# Patient Record
Sex: Male | Born: 1945 | Race: Black or African American | Hispanic: No | Marital: Married | Smoking: Former smoker
Health system: Southern US, Community
[De-identification: ages and names within clinical notes are randomized; demographics above are authoritative.]

## PROBLEM LIST (undated history)

## (undated) ENCOUNTER — Emergency Department (HOSPITAL_COMMUNITY): Admission: EM | Payer: Medicare Other | Source: Home / Self Care

## (undated) DIAGNOSIS — I5022 Chronic systolic (congestive) heart failure: Secondary | ICD-10-CM

## (undated) DIAGNOSIS — E785 Hyperlipidemia, unspecified: Secondary | ICD-10-CM

## (undated) DIAGNOSIS — D500 Iron deficiency anemia secondary to blood loss (chronic): Secondary | ICD-10-CM

## (undated) DIAGNOSIS — Z8614 Personal history of Methicillin resistant Staphylococcus aureus infection: Secondary | ICD-10-CM

## (undated) DIAGNOSIS — I639 Cerebral infarction, unspecified: Secondary | ICD-10-CM

## (undated) DIAGNOSIS — I4891 Unspecified atrial fibrillation: Secondary | ICD-10-CM

## (undated) DIAGNOSIS — G40909 Epilepsy, unspecified, not intractable, without status epilepticus: Secondary | ICD-10-CM

## (undated) DIAGNOSIS — D682 Hereditary deficiency of other clotting factors: Secondary | ICD-10-CM

## (undated) DIAGNOSIS — I4819 Other persistent atrial fibrillation: Secondary | ICD-10-CM

## (undated) DIAGNOSIS — I255 Ischemic cardiomyopathy: Secondary | ICD-10-CM

## (undated) DIAGNOSIS — K922 Gastrointestinal hemorrhage, unspecified: Secondary | ICD-10-CM

## (undated) DIAGNOSIS — Z86010 Personal history of colonic polyps: Secondary | ICD-10-CM

## (undated) DIAGNOSIS — R569 Unspecified convulsions: Secondary | ICD-10-CM

## (undated) DIAGNOSIS — E669 Obesity, unspecified: Secondary | ICD-10-CM

## (undated) DIAGNOSIS — R58 Hemorrhage, not elsewhere classified: Secondary | ICD-10-CM

## (undated) DIAGNOSIS — Z95810 Presence of automatic (implantable) cardiac defibrillator: Secondary | ICD-10-CM

## (undated) DIAGNOSIS — I10 Essential (primary) hypertension: Secondary | ICD-10-CM

## (undated) DIAGNOSIS — I2510 Atherosclerotic heart disease of native coronary artery without angina pectoris: Secondary | ICD-10-CM

## (undated) HISTORY — DX: Iron deficiency anemia secondary to blood loss (chronic): D50.0

## (undated) HISTORY — DX: Personal history of colonic polyps: Z86.010

## (undated) HISTORY — PX: OTHER SURGICAL HISTORY: SHX169

---

## 2009-12-24 HISTORY — PX: CORONARY ARTERY BYPASS GRAFT: SHX141

## 2011-05-25 DIAGNOSIS — Z8614 Personal history of Methicillin resistant Staphylococcus aureus infection: Secondary | ICD-10-CM

## 2011-05-25 DIAGNOSIS — D682 Hereditary deficiency of other clotting factors: Secondary | ICD-10-CM

## 2011-05-25 HISTORY — DX: Hereditary deficiency of other clotting factors: D68.2

## 2011-05-25 HISTORY — DX: Personal history of Methicillin resistant Staphylococcus aureus infection: Z86.14

## 2012-09-21 ENCOUNTER — Inpatient Hospital Stay (HOSPITAL_COMMUNITY)
Admission: EM | Admit: 2012-09-21 | Discharge: 2012-10-10 | DRG: 291 | Disposition: A | Discharge status: Home Health | Payer: PRIVATE HEALTH INSURANCE | Attending: Internal Medicine | Admitting: Internal Medicine

## 2012-09-21 ENCOUNTER — Inpatient Hospital Stay (HOSPITAL_COMMUNITY): Payer: PRIVATE HEALTH INSURANCE

## 2012-09-21 ENCOUNTER — Encounter (HOSPITAL_COMMUNITY): Payer: Self-pay | Admitting: *Deleted

## 2012-09-21 ENCOUNTER — Emergency Department (HOSPITAL_COMMUNITY): Payer: PRIVATE HEALTH INSURANCE

## 2012-09-21 DIAGNOSIS — E669 Obesity, unspecified: Secondary | ICD-10-CM | POA: Diagnosis present

## 2012-09-21 DIAGNOSIS — M542 Cervicalgia: Secondary | ICD-10-CM

## 2012-09-21 DIAGNOSIS — I255 Ischemic cardiomyopathy: Secondary | ICD-10-CM

## 2012-09-21 DIAGNOSIS — R9389 Abnormal findings on diagnostic imaging of other specified body structures: Secondary | ICD-10-CM

## 2012-09-21 DIAGNOSIS — G40909 Epilepsy, unspecified, not intractable, without status epilepticus: Secondary | ICD-10-CM | POA: Diagnosis not present

## 2012-09-21 DIAGNOSIS — I498 Other specified cardiac arrhythmias: Secondary | ICD-10-CM

## 2012-09-21 DIAGNOSIS — N189 Chronic kidney disease, unspecified: Secondary | ICD-10-CM | POA: Diagnosis present

## 2012-09-21 DIAGNOSIS — D682 Hereditary deficiency of other clotting factors: Secondary | ICD-10-CM

## 2012-09-21 DIAGNOSIS — I509 Heart failure, unspecified: Secondary | ICD-10-CM

## 2012-09-21 DIAGNOSIS — I2589 Other forms of chronic ischemic heart disease: Secondary | ICD-10-CM | POA: Diagnosis present

## 2012-09-21 DIAGNOSIS — I959 Hypotension, unspecified: Secondary | ICD-10-CM | POA: Diagnosis not present

## 2012-09-21 DIAGNOSIS — R911 Solitary pulmonary nodule: Secondary | ICD-10-CM

## 2012-09-21 DIAGNOSIS — I5023 Acute on chronic systolic (congestive) heart failure: Principal | ICD-10-CM

## 2012-09-21 DIAGNOSIS — I4892 Unspecified atrial flutter: Secondary | ICD-10-CM | POA: Diagnosis present

## 2012-09-21 DIAGNOSIS — IMO0001 Reserved for inherently not codable concepts without codable children: Secondary | ICD-10-CM | POA: Diagnosis not present

## 2012-09-21 DIAGNOSIS — T45515A Adverse effect of anticoagulants, initial encounter: Secondary | ICD-10-CM | POA: Diagnosis not present

## 2012-09-21 DIAGNOSIS — D66 Hereditary factor VIII deficiency: Secondary | ICD-10-CM | POA: Diagnosis present

## 2012-09-21 DIAGNOSIS — Z91199 Patient's noncompliance with other medical treatment and regimen due to unspecified reason: Secondary | ICD-10-CM

## 2012-09-21 DIAGNOSIS — Z95810 Presence of automatic (implantable) cardiac defibrillator: Secondary | ICD-10-CM

## 2012-09-21 DIAGNOSIS — I2510 Atherosclerotic heart disease of native coronary artery without angina pectoris: Secondary | ICD-10-CM

## 2012-09-21 DIAGNOSIS — R569 Unspecified convulsions: Secondary | ICD-10-CM

## 2012-09-21 DIAGNOSIS — Z951 Presence of aortocoronary bypass graft: Secondary | ICD-10-CM

## 2012-09-21 DIAGNOSIS — I4891 Unspecified atrial fibrillation: Secondary | ICD-10-CM

## 2012-09-21 DIAGNOSIS — R58 Hemorrhage, not elsewhere classified: Secondary | ICD-10-CM | POA: Diagnosis not present

## 2012-09-21 DIAGNOSIS — R791 Abnormal coagulation profile: Secondary | ICD-10-CM

## 2012-09-21 DIAGNOSIS — R000 Tachycardia, unspecified: Secondary | ICD-10-CM

## 2012-09-21 DIAGNOSIS — Y9210 Unspecified residential institution as the place of occurrence of the external cause: Secondary | ICD-10-CM | POA: Diagnosis not present

## 2012-09-21 DIAGNOSIS — Z79899 Other long term (current) drug therapy: Secondary | ICD-10-CM

## 2012-09-21 DIAGNOSIS — N289 Disorder of kidney and ureter, unspecified: Secondary | ICD-10-CM

## 2012-09-21 DIAGNOSIS — I10 Essential (primary) hypertension: Secondary | ICD-10-CM | POA: Diagnosis present

## 2012-09-21 DIAGNOSIS — E785 Hyperlipidemia, unspecified: Secondary | ICD-10-CM

## 2012-09-21 DIAGNOSIS — J039 Acute tonsillitis, unspecified: Secondary | ICD-10-CM | POA: Diagnosis not present

## 2012-09-21 DIAGNOSIS — R1310 Dysphagia, unspecified: Secondary | ICD-10-CM

## 2012-09-21 DIAGNOSIS — Z8614 Personal history of Methicillin resistant Staphylococcus aureus infection: Secondary | ICD-10-CM

## 2012-09-21 DIAGNOSIS — R7989 Other specified abnormal findings of blood chemistry: Secondary | ICD-10-CM

## 2012-09-21 DIAGNOSIS — Z8673 Personal history of transient ischemic attack (TIA), and cerebral infarction without residual deficits: Secondary | ICD-10-CM

## 2012-09-21 DIAGNOSIS — R0600 Dyspnea, unspecified: Secondary | ICD-10-CM

## 2012-09-21 DIAGNOSIS — D689 Coagulation defect, unspecified: Secondary | ICD-10-CM | POA: Diagnosis present

## 2012-09-21 DIAGNOSIS — R040 Epistaxis: Secondary | ICD-10-CM

## 2012-09-21 DIAGNOSIS — J36 Peritonsillar abscess: Secondary | ICD-10-CM | POA: Diagnosis not present

## 2012-09-21 DIAGNOSIS — I129 Hypertensive chronic kidney disease with stage 1 through stage 4 chronic kidney disease, or unspecified chronic kidney disease: Secondary | ICD-10-CM | POA: Diagnosis present

## 2012-09-21 DIAGNOSIS — Z9119 Patient's noncompliance with other medical treatment and regimen: Secondary | ICD-10-CM

## 2012-09-21 DIAGNOSIS — Z87891 Personal history of nicotine dependence: Secondary | ICD-10-CM

## 2012-09-21 HISTORY — DX: Chronic systolic (congestive) heart failure: I50.22

## 2012-09-21 HISTORY — DX: Essential (primary) hypertension: I10

## 2012-09-21 HISTORY — DX: Cerebral infarction, unspecified: I63.9

## 2012-09-21 HISTORY — DX: Personal history of Methicillin resistant Staphylococcus aureus infection: Z86.14

## 2012-09-21 HISTORY — DX: Epilepsy, unspecified, not intractable, without status epilepticus: G40.909

## 2012-09-21 HISTORY — DX: Atherosclerotic heart disease of native coronary artery without angina pectoris: I25.10

## 2012-09-21 HISTORY — DX: Presence of automatic (implantable) cardiac defibrillator: Z95.810

## 2012-09-21 HISTORY — DX: Ischemic cardiomyopathy: I25.5

## 2012-09-21 HISTORY — DX: Unspecified atrial fibrillation: I48.91

## 2012-09-21 HISTORY — DX: Obesity, unspecified: E66.9

## 2012-09-21 HISTORY — DX: Hereditary deficiency of other clotting factors: D68.2

## 2012-09-21 HISTORY — DX: Hyperlipidemia, unspecified: E78.5

## 2012-09-21 LAB — BASIC METABOLIC PANEL
BUN: 22 mg/dL (ref 6–23)
BUN: 22 mg/dL (ref 6–23)
Calcium: 9.2 mg/dL (ref 8.4–10.5)
Chloride: 106 mEq/L (ref 96–112)
Creatinine, Ser: 1.24 mg/dL (ref 0.50–1.35)
Creatinine, Ser: 1.33 mg/dL (ref 0.50–1.35)
GFR calc Af Amer: 64 mL/min — ABNORMAL LOW (ref >90)
GFR calc non Af Amer: 60 mL/min — ABNORMAL LOW (ref >90)
GFR calc non Af Amer: 55 mL/min — ABNORMAL LOW (ref >90)
Glucose, Bld: 88 mg/dL (ref 70–99)
Potassium: 4.2 mEq/L (ref 3.5–5.1)

## 2012-09-21 LAB — URINE MICROSCOPIC-ADD ON

## 2012-09-21 LAB — CBC WITH DIFFERENTIAL/PLATELET
Eosinophils Absolute: 0.2 10*3/uL (ref 0.0–0.7)
Eosinophils Relative: 5 % (ref 0–5)
HCT: 43.7 % (ref 39.0–52.0)
Hemoglobin: 14.5 g/dL (ref 13.0–17.0)
Lymphs Abs: 1.8 10*3/uL (ref 0.7–4.0)
MCH: 27.4 pg (ref 26.0–34.0)
MCHC: 33.2 g/dL (ref 30.0–36.0)
MCV: 82.6 fL (ref 78.0–100.0)
Monocytes Absolute: 0.6 10*3/uL (ref 0.1–1.0)
Monocytes Relative: 13 % — ABNORMAL HIGH (ref 3–12)
RBC: 5.29 MIL/uL (ref 4.22–5.81)

## 2012-09-21 LAB — RAPID URINE DRUG SCREEN, HOSP PERFORMED
Amphetamines: NOT DETECTED
Cocaine: NOT DETECTED
Opiates: NOT DETECTED
Tetrahydrocannabinol: NOT DETECTED

## 2012-09-21 LAB — CK TOTAL AND CKMB (NOT AT ARMC)
Relative Index: 5.2 — ABNORMAL HIGH (ref 0.0–2.5)
Total CK: 144 U/L (ref 7–232)
Total CK: 134 U/L (ref 7–232)

## 2012-09-21 LAB — URINALYSIS, ROUTINE W REFLEX MICROSCOPIC
Glucose, UA: NEGATIVE mg/dL
Protein, ur: >300 mg/dL — AB

## 2012-09-21 LAB — MAGNESIUM: Magnesium: 1.7 mg/dL (ref 1.5–2.5)

## 2012-09-21 LAB — HEMOGLOBIN A1C: Hgb A1c MFr Bld: 5.8 % — ABNORMAL HIGH (ref <5.7)

## 2012-09-21 MED ORDER — SODIUM CHLORIDE 0.9 % IJ SOLN
3.0000 mL | INTRAMUSCULAR | Status: DC | PRN
  Administered 2012-09-24 – 2012-09-28 (×3): 3 mL via INTRAVENOUS

## 2012-09-21 MED ORDER — IOHEXOL 350 MG/ML SOLN
80.0000 mL | Freq: Once | INTRAVENOUS | Status: AC | PRN
  Administered 2012-09-21: 80 mL via INTRAVENOUS

## 2012-09-21 MED ORDER — METOPROLOL TARTRATE 25 MG PO TABS
25.0000 mg | ORAL_TABLET | Freq: Two times a day (BID) | ORAL | Status: DC
Start: 2012-09-21 — End: 2012-09-21
  Administered 2012-09-21: 25 mg via ORAL
  Filled 2012-09-21 (×2): qty 1

## 2012-09-21 MED ORDER — SODIUM CHLORIDE 0.9 % IV SOLN
250.0000 mL | INTRAVENOUS | Status: DC | PRN
  Administered 2012-09-24: 10 mL/h via INTRAVENOUS
  Administered 2012-10-06 – 2012-10-10 (×6): 250 mL via INTRAVENOUS

## 2012-09-21 MED ORDER — ASPIRIN EC 81 MG PO TBEC
81.0000 mg | DELAYED_RELEASE_TABLET | Freq: Every day | ORAL | Status: DC
  Administered 2012-09-21 – 2012-09-29 (×9): 81 mg via ORAL
  Filled 2012-09-21 (×10): qty 1

## 2012-09-21 MED ORDER — ACETAMINOPHEN 325 MG PO TABS
650.0000 mg | ORAL_TABLET | ORAL | Status: DC | PRN
  Administered 2012-09-25 – 2012-10-06 (×4): 650 mg via ORAL
  Filled 2012-09-21: qty 2
  Filled 2012-09-21: qty 1
  Filled 2012-09-21 (×3): qty 2

## 2012-09-21 MED ORDER — FUROSEMIDE 10 MG/ML IJ SOLN
40.0000 mg | Freq: Once | INTRAMUSCULAR | Status: AC
  Administered 2012-09-21: 40 mg via INTRAVENOUS
  Filled 2012-09-21: qty 4

## 2012-09-21 MED ORDER — METOPROLOL TARTRATE 1 MG/ML IV SOLN
2.5000 mg | Freq: Once | INTRAVENOUS | Status: AC
  Administered 2012-09-21: 2.5 mg via INTRAVENOUS
  Filled 2012-09-21: qty 5

## 2012-09-21 MED ORDER — SODIUM CHLORIDE 0.9 % IV BOLUS (SEPSIS)
500.0000 mL | Freq: Once | INTRAVENOUS | Status: AC
  Administered 2012-09-21: 500 mL via INTRAVENOUS

## 2012-09-21 MED ORDER — LORAZEPAM 2 MG/ML IJ SOLN
1.0000 mg | Freq: Once | INTRAMUSCULAR | Status: AC
  Administered 2012-09-21: 1 mg via INTRAVENOUS
  Filled 2012-09-21: qty 1

## 2012-09-21 MED ORDER — LISINOPRIL 5 MG PO TABS
5.0000 mg | ORAL_TABLET | Freq: Every day | ORAL | Status: DC
  Administered 2012-09-21 – 2012-09-22 (×2): 5 mg via ORAL
  Filled 2012-09-21 (×2): qty 1

## 2012-09-21 MED ORDER — METOPROLOL TARTRATE 1 MG/ML IV SOLN
INTRAVENOUS | Status: AC
  Filled 2012-09-21: qty 5

## 2012-09-21 MED ORDER — METOPROLOL TARTRATE 1 MG/ML IV SOLN
5.0000 mg | Freq: Once | INTRAVENOUS | Status: AC
  Administered 2012-09-21: 5 mg via INTRAVENOUS

## 2012-09-21 MED ORDER — FUROSEMIDE 10 MG/ML IJ SOLN
40.0000 mg | Freq: Two times a day (BID) | INTRAMUSCULAR | Status: DC
  Administered 2012-09-21 – 2012-09-22 (×2): 40 mg via INTRAVENOUS
  Filled 2012-09-21 (×4): qty 4

## 2012-09-21 MED ORDER — LORAZEPAM 1 MG PO TABS
1.0000 mg | ORAL_TABLET | Freq: Once | ORAL | Status: AC
  Administered 2012-09-21: 1 mg via ORAL
  Filled 2012-09-21: qty 1

## 2012-09-21 MED ORDER — SODIUM CHLORIDE 0.9 % IJ SOLN
3.0000 mL | Freq: Two times a day (BID) | INTRAMUSCULAR | Status: DC
  Administered 2012-09-21 – 2012-10-07 (×16): 3 mL via INTRAVENOUS

## 2012-09-21 MED ORDER — METOPROLOL TARTRATE 1 MG/ML IV SOLN
5.0000 mg | Freq: Once | INTRAVENOUS | Status: AC
  Administered 2012-09-21: 5 mg via INTRAVENOUS
  Filled 2012-09-21: qty 5

## 2012-09-21 MED ORDER — HEPARIN (PORCINE) IN NACL 100-0.45 UNIT/ML-% IJ SOLN
1350.0000 [IU]/h | INTRAMUSCULAR | Status: DC
  Administered 2012-09-21 – 2012-09-24 (×3): 1350 [IU]/h via INTRAVENOUS
  Filled 2012-09-21 (×5): qty 250

## 2012-09-21 MED ORDER — ENOXAPARIN SODIUM 100 MG/ML ~~LOC~~ SOLN
100.0000 mg | Freq: Two times a day (BID) | SUBCUTANEOUS | Status: DC
  Filled 2012-09-21 (×2): qty 1

## 2012-09-21 MED ORDER — ONDANSETRON HCL 4 MG/2ML IJ SOLN
4.0000 mg | Freq: Four times a day (QID) | INTRAMUSCULAR | Status: DC | PRN
  Administered 2012-09-25: 4 mg via INTRAVENOUS
  Filled 2012-09-21: qty 2

## 2012-09-21 MED ORDER — SIMVASTATIN 40 MG PO TABS
40.0000 mg | ORAL_TABLET | Freq: Every day | ORAL | Status: DC
  Administered 2012-09-21 – 2012-10-05 (×14): 40 mg via ORAL
  Filled 2012-09-21 (×16): qty 1

## 2012-09-21 MED ORDER — METOPROLOL TARTRATE 25 MG PO TABS
25.0000 mg | ORAL_TABLET | Freq: Four times a day (QID) | ORAL | Status: DC
  Administered 2012-09-21 – 2012-09-22 (×3): 25 mg via ORAL
  Filled 2012-09-21 (×6): qty 1

## 2012-09-21 MED ORDER — ENOXAPARIN SODIUM 40 MG/0.4ML ~~LOC~~ SOLN
40.0000 mg | SUBCUTANEOUS | Status: DC
  Administered 2012-09-21: 40 mg via SUBCUTANEOUS
  Filled 2012-09-21: qty 0.4

## 2012-09-21 NOTE — Progress Notes (Signed)
Dr. Tenny Craw paged regarding EKG changes seen on 12 lead EKG and to report pt has a  CK-MB at 7.5 critical level. Awaiting for response.  Ancil Linsey RN

## 2012-09-21 NOTE — ED Notes (Signed)
Dr. Rhunette Croft notified of pt's anxiety regarding laying flat in CT.

## 2012-09-21 NOTE — ED Notes (Signed)
Left message with Secretary, RN Cordelia Pen to call us back ASAP.

## 2012-09-21 NOTE — Progress Notes (Signed)
Dr. Tenny Craw updated with pt hr in the 130's still post lopressor 25 mg po. SBP in the 120's . New order received for lopressor IV nad increased  Po lopressor. Ancil Linsey  RN

## 2012-09-21 NOTE — Progress Notes (Signed)
[facility] faxed request for Medical Red[facility] to [facility]. Awaiting for response. Thanks Ancil Linsey RN

## 2012-09-21 NOTE — Consult Note (Signed)
Patient ID: Louis Ford MRN: [id number], DOB/AGE: [birth year]   Admit date: 09/21/2012 Date of Consult: @TODAY @  Primary Physician: Quitman Livings, MD Primary Cardiologist: New    Problem List: History reviewed. No pertinent past medical history.  History reviewed. No pertinent past surgical history.   Allergies: No Known Allergies  HPI: Patient is a 66 year old with history of cardiomyopathy.  Asked to see re continued care.  Patient poor historian at present as received ativan for CT. Patient moved to [place] from [place].   He has a history of CAD (s/p CABG at [facility] in [facility]  Family member reprorts this was greater than 10 years ago.)  He is also s/p AICD placement [facility]) Had change out done at   [employer]  Patient does not remember years.  Says device never fired.   Patient woke up with SOB today.     Denies CP.    Says this was sudden.   Denies palpitations.  No syncope.  No significant edema.  Relative reports that patient has not been seen by Cardiology in a long time.  Inpatient Medications:    . furosemide  40 mg Intravenous Once  . LORazepam  1 mg Intravenous Once  . LORazepam  1 mg Oral Once  . metoprolol  5 mg Intravenous Once  . sodium chloride  500 mL Intravenous Once    History reviewed. No pertinent family history.   History   Social History  . Marital Status: Single    Spouse Name: N/A    Number of Children: N/A  . Years of Education: N/A   Occupational History  . Not on file.   Social History Main Topics  . Smoking status: Not on file  . Smokeless tobacco: Not on file  . Alcohol Use:   . Drug Use: No  . Sexually Active:    Other Topics Concern  . Not on file   Social History Narrative  . No narrative on file     Review of Systems:  All other systems reviewed and are otherwise negative except as noted above.  Physical Exam: Filed Vitals:   09/21/12 0945  BP: 126/107  Pulse:   Temp:   Resp: 23   No  intake or output data in the 24 hours ending 09/21/12 1041  General: Well developed, well nourished, in no acute distress.  Groggy after ativan. Head: Normocephalic, atraumatic, sclera non-icteric Neck: Negative for carotid bruits. JVP is elevated.   Lungs: Clear bilaterally to auscultation without wheezes, rales, or rhonchi. Breathing is unlabored. Heart: RRR with S1 S2. No murmurs, rubs, or gallops appreciated. Abdomen: Soft, non-tender, non-distended with normoactive bowel sounds. No hepatomegaly. No rebound/guarding. No obvious abdominal masses. Msk:  Strength and tone appears normal for age. Extremities: No clubbing, cyanosis or edema.  Distal pedal pulses are 2+ and equal bilaterally. Neuro: Alert and oriented X 3. Moves all extremities spontaneously. Psych:  Responds to questions appropriately with a normal affect.  Tele:  ST  130s.  Labs: Results for orders placed during the hospital encounter of 09/21/12 (from the past 24 hour(s))  CBC WITH DIFFERENTIAL     Status: Abnormal   Collection Time   09/21/12  5:15 AM      Component Value Range   WBC 4.5  4.0 - 10.5 K/uL   RBC 5.29  4.22 - 5.81 MIL/uL   Hemoglobin 14.5  13.0 - 17.0 g/dL   HCT 40.9  81.1 - 91.4 %   MCV  82.6  78.0 - 100.0 fL   MCH 27.4  26.0 - 34.0 pg   MCHC 33.2  30.0 - 36.0 g/dL   RDW 47.8 (*) 29.5 - 62.1 %   Platelets 147 (*) 150 - 400 K/uL   Neutrophils Relative 42 (*) 43 - 77 %   Neutro Abs 1.9  1.7 - 7.7 K/uL   Lymphocytes Relative 39  12 - 46 %   Lymphs Abs 1.8  0.7 - 4.0 K/uL   Monocytes Relative 13 (*) 3 - 12 %   Monocytes Absolute 0.6  0.1 - 1.0 K/uL   Eosinophils Relative 5  0 - 5 %   Eosinophils Absolute 0.2  0.0 - 0.7 K/uL   Basophils Relative 0  0 - 1 %   Basophils Absolute 0.0  0.0 - 0.1 K/uL  BASIC METABOLIC PANEL     Status: Abnormal   Collection Time   09/21/12  5:15 AM      Component Value Range   Sodium 140  135 - 145 mEq/L   Potassium 4.2  3.5 - 5.1 mEq/L   Chloride 105  96 - 112 mEq/L    CO2 19  19 - 32 mEq/L   Glucose, Bld 88  70 - 99 mg/dL   BUN 22  6 - 23 mg/dL   Creatinine, Ser 3.08  0.50 - 1.35 mg/dL   Calcium 9.2  8.4 - 65.7 mg/dL   GFR calc non Af Amer 60 (*) >90 mL/min   GFR calc Af Amer 70 (*) >90 mL/min  TROPONIN I     Status: Normal   Collection Time   09/21/12  5:15 AM      Component Value Range   Troponin I <0.30  <0.30 ng/mL  PRO B NATRIURETIC PEPTIDE     Status: Abnormal   Collection Time   09/21/12  5:15 AM      Component Value Range   Pro B Natriuretic peptide (BNP) 2267.0 (*) 0 - 125 pg/mL  MAGNESIUM     Status: Normal   Collection Time   09/21/12  5:15 AM      Component Value Range   Magnesium 1.7  1.5 - 2.5 mg/dL  PHOSPHORUS     Status: Normal   Collection Time   09/21/12  5:15 AM      Component Value Range   Phosphorus 4.5  2.3 - 4.6 mg/dL  D-DIMER, QUANTITATIVE     Status: Abnormal   Collection Time   09/21/12  5:15 AM      Component Value Range   D-Dimer, Quant 0.84 (*) 0.00 - 0.48 ug/mL-FEU  URINALYSIS, ROUTINE W REFLEX MICROSCOPIC     Status: Abnormal   Collection Time   09/21/12  8:36 AM      Component Value Range   Color, Urine AMBER (*) YELLOW   APPearance CLOUDY (*) CLEAR   Specific Gravity, Urine 1.021  1.005 - 1.030   pH 5.5  5.0 - 8.0   Glucose, UA NEGATIVE  NEGATIVE mg/dL   Hgb urine dipstick SMALL (*) NEGATIVE   Bilirubin Urine SMALL (*) NEGATIVE   Ketones, ur NEGATIVE  NEGATIVE mg/dL   Protein, ur >846 (*) NEGATIVE mg/dL   Urobilinogen, UA 1.0  0.0 - 1.0 mg/dL   Nitrite NEGATIVE  NEGATIVE   Leukocytes, UA NEGATIVE  NEGATIVE  URINE RAPID DRUG SCREEN (HOSP PERFORMED)     Status: Normal   Collection Time   09/21/12  8:36 AM  Component Value Range   Opiates NONE DETECTED  NONE DETECTED   Cocaine NONE DETECTED  NONE DETECTED   Benzodiazepines NONE DETECTED  NONE DETECTED   Amphetamines NONE DETECTED  NONE DETECTED   Tetrahydrocannabinol NONE DETECTED  NONE DETECTED   Barbiturates NONE DETECTED  NONE DETECTED    URINE MICROSCOPIC-ADD ON     Status: Abnormal   Collection Time   09/21/12  8:36 AM      Component Value Range   Squamous Epithelial / LPF MANY (*) RARE   RBC / HPF 3-6  <3 RBC/hpf   Casts GRANULAR CAST (*) NEGATIVE    Radiology/Studies: Dg Chest Port 1 View  09/21/2012  *RADIOLOGY REPORT*  Clinical Data: Shortness of breath.  PORTABLE CHEST - 1 VIEW  Comparison: None.  Findings: Semi upright portable view of the chest demonstrates marked enlargement cardiac silhouette. There is a right-sided cardiac ICD.  There is a 2.9 cm ill defined density just medial to the battery device.  This could represent a large vascular structure but a nodular lesion or parenchymal density cannot be excluded.  Hazy interstitial densities in the lower lungs may represent mild edema.  Patient is status post median sternotomy.  IMPRESSION: Cardiomegaly and suspect mild interstitial edema.  Indeterminate nodular density in the right hilar region which is obscured by the ICD battery device.  The findings could represent vascular structures but cannot exclude a parenchymal lesion or nodule.  Recommend further evaluation with a chest CT.  These results were called by telephone on 09/21/2012 at 7:20 a.m. to Dr. Rhunette Croft, who verbally acknowledged these results.   Original Report Authenticated By: Richarda Overlie, M.D.     EKG:  Sinus tachycardia.  139 bpm  Twave inversion I, AVL.  QTc 493  ASSESSMENT AND PLAN:   Patient is a 66 yr old who presents with sudden SOB.  On exam, pt is hypertensive with mild volume increase.  Groggy since sedation given. SOB seems out of proportion to exam  May be related to HTN  May represent anginal equivalent.  Does not appear uncomfortable  Rec;  Admit to telemetry as doin. Lasix IV x 1  Follow response.   Patient brought list of medsd in but now unable to find Would start low dose b blocker.  Esp with tachycardia Echo to evaluate LV function  2.  CAD  Get records from [place] re CABG and ICD  [facility], [place]) Rec:  ASA.  Control BP  Check Lipids  3.  HCM  Lipids.  zocor    4.  Hx CVA   Signed, Dietrich Pates 09/21/2012, 10:41 AM

## 2012-09-21 NOTE — ED Notes (Signed)
Admitting MD at pt bedside at this time.

## 2012-09-21 NOTE — ED Notes (Signed)
Pt to CT via stretcher at this time

## 2012-09-21 NOTE — ED Provider Notes (Addendum)
History     CSN: [id number]  Arrival date & time 09/21/12  0506   First MD Initiated Contact with Patient 09/21/12 0510      Chief Complaint  Patient presents with  . Shortness of Breath    (Consider location/radiation/quality/duration/timing/severity/associated sxs/prior treatment) HPI Comments: Pt comes in with cc of sob. Pt has hx of CAD s/p CABG several years ago, no recent cardiac testing, and AICD placement for unknown etiology. Pt is new to our system, and has moved to [place] from [place]. Pt reports that this night he woke up to go to the bathroom and started having sob. The sob is described as difficulty getting air in, with no associated chest pain. Pt denies any orthopnea, or PND. He does have intermittent leg swelling, and takes "water pill" prn. Pt has no hx of DVT/PE. Pt has no new cough. His aicd hasn;t fired. No illicits, no infections.   The history is provided by the patient.    History reviewed. No pertinent past medical history.  History reviewed. No pertinent past surgical history.  History reviewed. No pertinent family history.  History  Substance Use Topics  . Smoking status: Not on file  . Smokeless tobacco: Not on file  . Alcohol Use:       Review of Systems  Constitutional: Negative for fever, chills, activity change and appetite change.  HENT: Negative for neck pain.   Eyes: Negative for visual disturbance.  Respiratory: Positive for shortness of breath. Negative for cough and chest tightness.   Cardiovascular: Positive for palpitations and leg swelling. Negative for chest pain.  Gastrointestinal: Negative for abdominal pain and abdominal distention.  Genitourinary: Negative for dysuria, enuresis and difficulty urinating.  Musculoskeletal: Negative for arthralgias.  Neurological: Positive for dizziness and light-headedness. Negative for syncope and headaches.  Psychiatric/Behavioral: Negative for confusion.    Allergies  Review of patient's  allergies indicates no known allergies.  Home Medications  No current outpatient prescriptions on file.  BP 157/112  Pulse 140  Temp 97.1 F (36.2 C) (Oral)  Resp 22  SpO2 100%  Physical Exam  Nursing note and vitals reviewed. Constitutional: He is oriented to person, place, and time. He appears well-developed.  HENT:  Head: Normocephalic and atraumatic.  Eyes: Conjunctivae normal and EOM are normal. Pupils are equal, round, and reactive to light.  Neck: Normal range of motion. Neck supple. JVD present.  Cardiovascular: Regular rhythm.   No murmur heard. Pulmonary/Chest: Effort normal and breath sounds normal. He has no wheezes. He has no rales. He exhibits no tenderness.  Abdominal: Soft. Bowel sounds are normal. He exhibits no distension. There is no tenderness. There is no rebound and no guarding.  Neurological: He is alert and oriented to person, place, and time.  Skin: Skin is warm.    ED Course  Procedures (including critical care time)   Labs Reviewed  CBC WITH DIFFERENTIAL  BASIC METABOLIC PANEL  TROPONIN I  URINALYSIS, ROUTINE W REFLEX MICROSCOPIC  URINE RAPID DRUG SCREEN (HOSP PERFORMED)  PRO B NATRIURETIC PEPTIDE  MAGNESIUM  PHOSPHORUS  D-DIMER, QUANTITATIVE   No results found.   No diagnosis found.    MDM   Date: 09/21/2012  Rate: 139  Rhythm: sinus tachycardia  QRS Axis: normal  Intervals: normal  ST/T Wave abnormalities: non specific  Conduction Disutrbances: none  Narrative Interpretation: unremarkable  Differential diagnosis includes: ACS syndrome CHF exacerbation Valvular disorder Pericardial effusion Pneumonia Pleural effusion Pulmonary edema PE Anemia Musculoskeletal pain COPD Toxin use  Pt with hx of CAD s/p CABG and AICD placement comes in with cc of acute SOB. Pt noted to be tachypneic, no respiratory distress symptoms otherwise with normal O2 sats and mildly elevated BP only. Exam shows clear lungs, tachycardia, with  JVD and mild pitting edema in the legs. He has no chest pain. AICD hasnt fired. No substance abuse, taking all his meds.  We will initiate the cardiac workup and also gt utox and dimer with cxr. Will try to get information on the device. Pt unsure of his hx, not sure why aicd was placed. Hydration to be started as well. Will reassess.      Derwood Kaplan, MD 09/21/12 0522  CXR shows some vascular congestion, dimer elevated -CT PE ordered. Still sinus tachycardia at 130s now. Will give nitro x 3 SL. BP is in the 130s as well.  Derwood Kaplan, MD 09/21/12 [phone]

## 2012-09-21 NOTE — ED Notes (Signed)
Pt has a urinal and knows we need a urine sample 

## 2012-09-21 NOTE — ED Notes (Signed)
Pt returned from CT at this time.  

## 2012-09-21 NOTE — ED Notes (Signed)
X-ray currently at bedside; family member given coffee while waiting for x-ray to finish.

## 2012-09-21 NOTE — Progress Notes (Signed)
Authorization for Use/disclosure of Protected health information  Release form faxed to [facility] in [place] sent and and received by said hospital. [facility] has called back and informed this nurse that they do not have Mr. Collins on their system. They had checked his SS # and under his name. Thanks Ancil Linsey RN

## 2012-09-21 NOTE — ED Notes (Signed)
Patient arrived to CT and attempted scan.  Patient refused due to anxiety/inability to lie still and flat- requesting to be "put out" for procedure.  Attempts to call RN x 3 failed, attempt to call MD x 2 rang over to secretary at this time.  Patient being returned to ED without exam.

## 2012-09-21 NOTE — ED Notes (Addendum)
Per EMS: pt called for SOB, pt states that he was getting SOB and feelings of empending doom. Pt was diaphoretic on EMS arrival with any exerction. Pt states that he does not normally have an irregular hr. Pt was given 324mg  of aspirin and 1 SL nitro.

## 2012-09-21 NOTE — Progress Notes (Signed)
Spoke with Tereso Newcomer PA-c regarding EKG changes and on CK MB result. New order received to transition lovenox to heparin drip. Pt ekg indicating a flutter at variable rate.  Had just spoken to Dr. Tenny Craw and was updated of above.Thanks Ancil Linsey RN

## 2012-09-21 NOTE — ED Notes (Signed)
CT aware that pt has had PO ativan and will be ready to come to CT in 15-30 minutes.

## 2012-09-21 NOTE — H&P (Signed)
History and Physical       Hospital Admission Note Date: 09/21/2012  Patient name: Louis Ford Medical record number: [id number] Date of birth: May 29, 1946 Age: 66 y.o. Gender: male PCP: Quitman Livings, MD    Chief Complaint:  sudden shortness of breath this morning  HPI: Patient is a 66 year old African American male who has recently relocated from [place] in June 2013, with a medical history of coronary disease status post CABG, AICD, hypertension, hyperlipidemia, CHF, ?seizures not on any seizure medications presented with acute shortness of breath. Per patient, he woke up to go to the bathroom around 5 AM this morning and started having shortness of breath, described as "difficulty getting the air in" but no associated chest pain.he denied fevers, chills, productive cough, any diaphoresis. Patient sleeps on 2 pillows but states for comfort. He has been having intermittent peripheral edema with dyspnea on exertion. He denies any history of DVT/PE. Patient had followed up in July, 2013 with [facility] but never filled any prescription for Vasotec, simvastatin, Lasix, Coreg. In the ED, patient was found to be tachycardiac with heart rate in 130s, sinus, with mild hypoxia, CT angiogram was done with no PE, first set of troponins negative, BNP was elevated at 2267.  Review of Systems:  Constitutional: Denies fever, chills, diaphoresis, appetite change and fatigue.  HEENT: Denies photophobia, eye pain, redness, hearing loss, ear pain, congestion, sore throat, rhinorrhea, sneezing, mouth sores, trouble swallowing, neck pain, neck stiffness and tinnitus.   Respiratory: please see history of present illness Cardiovascular: please see history of present illness Gastrointestinal: Denies nausea, vomiting, abdominal pain, diarrhea, constipation, blood in stool and abdominal distention.  Genitourinary: Denies dysuria, urgency, frequency, hematuria,  flank pain and difficulty urinating.  Musculoskeletal: Denies myalgias, back pain, joint swelling, arthralgias and gait problem.  Skin: Denies pallor, rash and wound.  Neurological: Denies dizziness, syncope, weakness, light-headedness, numbness and headaches. Patient gives a vague history of seizures after AICD placement but is not on any seizure medication for more than a year Hematological: Denies adenopathy. Easy bruising, personal or family bleeding history  Psychiatric/Behavioral: Denies suicidal ideation, mood changes, confusion, nervousness, sleep disturbance and agitation  Past Medical History: 1) Coronary artery disease status post CABG several years ago. Patient states last stress test was 2 years ago and to his knowledge was negative done at [facility] in [place] 2) A ICD placement 3) hypertension 4) hyperlipidemia 5) congestive heart failure with unknown EF 6) history of seizures after AICD placement   Past surgical history Coronary artery bypass graft AICD placement  Medications: Prior to Admission medications   Not on File  Patient is not taking any of the medications. He was prescribed Vasotec 10 mg daily, simvastatin 40 mg daily, Lasix 40 mg daily, Coreg 6.25 mg BID in July 2013, however he has not filled any prescription  Allergies:  No Known Allergies  Social History:  does not have a smoking history on file. He does not have any smokeless tobacco history on file. He reports that he does not use illicit drugs. His alcohol history not on file.  Family History: History reviewed. Patient states that his mother is deceased and had coronary disease in her 58s  Physical Exam: Blood pressure 138/110, pulse 137, temperature 97.1 F (36.2 C), temperature source Oral, resp. rate 16, SpO2 99.00%. General: Alert, awake, oriented x3, in no acute distress. HEENT: normocephalic, atraumatic, anicteric sclera, pink conjunctiva, pupils equal and reactive to light and  accomodation, oropharynx clear Neck: supple,  no masses or lymphadenopathy, no goiter, no bruits +elevated JVP Heart: tachy, Regular rate and rhythm, without murmurs, rubs or gallops. Lungs: Clear to auscultation bilaterally, no wheezing, rales or rhonchi. Abdomen: Soft, nontender, nondistended, positive bowel sounds, no masses. Extremities: No clubbing, cyanosis or edema with positive pedal pulses. Neuro: Grossly intact, no focal neurological deficits, strength 5/5 upper and lower extremities bilaterally Psych: alert and oriented x 3, normal mood and affect Skin: no rashes or lesions, warm and dry   LABS on Admission:  Basic Metabolic Panel:  Lab 09/21/12 1610  NA 140  K 4.2  CL 105  CO2 19  GLUCOSE 88  BUN 22  CREATININE 1.24  CALCIUM 9.2  MG 1.7  PHOS 4.5  CBC:  Lab 09/21/12 0515  WBC 4.5  NEUTROABS 1.9  HGB 14.5  HCT 43.7  MCV 82.6  PLT 147*   Cardiac Enzymes:  Lab 09/21/12 0515  CKTOTAL --  CKMB --  CKMBINDEX --  TROPONINI <0.30     Radiological Exams on Admission: Dg Chest Port 1 View  09/21/2012  *RADIOLOGY REPORT*  Clinical Data: Shortness of breath.  PORTABLE CHEST - 1 VIEW  Comparison: None.  Findings: Semi upright portable view of the chest demonstrates marked enlargement cardiac silhouette. There is a right-sided cardiac ICD.  There is a 2.9 cm ill defined density just medial to the battery device.  This could represent a large vascular structure but a nodular lesion or parenchymal density cannot be excluded.  Hazy interstitial densities in the lower lungs may represent mild edema.  Patient is status post median sternotomy.  IMPRESSION: Cardiomegaly and suspect mild interstitial edema.  Indeterminate nodular density in the right hilar region which is obscured by the ICD battery device.  The findings could represent vascular structures but cannot exclude a parenchymal lesion or nodule.  Recommend further evaluation with a chest CT.  These results were called  by telephone on 09/21/2012 at 7:20 a.m. to Dr. Rhunette Croft, who verbally acknowledged these results.   Original Report Authenticated By: Richarda Overlie, M.D.    EKG showed rate of 139, sinus tachycardia with repolarization changes  Assessment/Plan Present on Admission:   .Dyspnea with tachycardia: likely secondary to acute on chronic CHF exacerbation, however given tachycardia and mild hypoxia cannot rule out pulmonary embolism - admit to telemetry, serial cardiac enzymes, CT angiogram chest was done and showed no pulmonary embolism - urine drug screen was negative, obtain TSH  .CHF, acute on chronic: unknown EF - Placed on CHF protocol, serial cardiac enzymes, IV diuresis with Lasix, Vasotec,beta blocker, aspirin - obtain 2-D echocardiogram for further workup, daily weights, input and output, fluid restriction, 2 g sodium diet - Cardiology consult has been obtained, discussed with Dr. Tenny Craw  .Elevated d-dimer: CT angiogram chest done in ED negative for any acute pulmonary embolism  .Accelerated hypertension: Likely central to noncompliance - Resume beta blocker and ACE inhibitor, placed on IV diuresis  .Hyperlipidemia: obtain lipid panel, continue statins  .CAD (coronary artery disease):Currently no chest pain - Continue aspirin, beta blocker, statins, ACE inhibitor, 2-D echo pending  .AICD (automatic cardioverter/defibrillator) present  .Seizures history after ICD placement: monitor, patient is not on any seizure medications  DVT prophylaxis: Lovenox  CODE STATUS: full CODE STATUS  Further plan will depend as patient's clinical course evolves and further radiologic and laboratory data become available.   Time Spent on Admission: 1 hour  RAI,RIPUDEEP M.D. [facility] 09/21/2012, 9:59 AM Pager: [phone]  If 7PM-7AM, please contact night-coverage  [email or web address] Password [email or web address]

## 2012-09-21 NOTE — Progress Notes (Addendum)
ANTICOAGULATION CONSULT NOTE - Initial Consult  Pharmacy Consult for heparin Indication: atrial fibrillation  No Known Allergies  Patient Measurements: Height: 5\' 11"  (180.3 cm) Weight: 227 lb 9.6 oz (103.239 kg) IBW/kg (Calculated) : 75.3  Heparin Dosing Weight: 95 kg  Vital Signs: Temp: 97.8 F (36.6 C) (09/29 1842) Temp src: Oral (09/29 1842) BP: 106/80 mmHg (09/29 1842) Pulse Rate: 90  (09/29 1842)  Labs:  Basename 09/21/12 1543 09/21/12 1112 09/21/12 0515  HGB -- -- 14.5  HCT -- -- 43.7  PLT -- -- 147*  APTT -- -- --  LABPROT -- -- --  INR -- -- --  HEPARINUNFRC -- -- --  CREATININE -- 1.33 1.24  CKTOTAL 144 -- --  CKMB 7.5* -- --  TROPONINI <0.30 -- <0.30    Estimated Creatinine Clearance: 69.6 ml/min (by C-G formula based on Cr of 1.33).   Medical History: Past Medical History  Diagnosis Date  . CAD (coronary artery disease)   . CHF (congestive heart failure)     Medications:  Scheduled:    . aspirin EC  81 mg Oral Daily  . furosemide  40 mg Intravenous Once  . furosemide  40 mg Intravenous Q12H  . lisinopril  5 mg Oral Daily  . LORazepam  1 mg Intravenous Once  . LORazepam  1 mg Oral Once  . metoprolol  2.5 mg Intravenous Once  . metoprolol  5 mg Intravenous Once  . metoprolol  5 mg Intravenous Once  . metoprolol tartrate  25 mg Oral QID  . simvastatin  40 mg Oral q1800  . sodium chloride  500 mL Intravenous Once  . sodium chloride  3 mL Intravenous Q12H  . DISCONTD: enoxaparin (LOVENOX) injection  100 mg Subcutaneous Q12H  . DISCONTD: enoxaparin (LOVENOX) injection  40 mg Subcutaneous Q24H  . DISCONTD: metoprolol tartrate  25 mg Oral BID   Infusions:    Assessment: 67 yo male with atrial fib will be started on heparin therapy.  Received 40mg  of lovenox at 1410 today.  Baseline CBC ok (Plt 147k).    Goal of Therapy:  Heparin level 0.3-0.7 units/ml Monitor platelets by anticoagulation protocol: Yes   Plan:  1) D/c lovenox 40mg  sq.   Start heparin at 1350 units/hr (=13.9mL/hr). No bolus. 2) Check a 6 hour heparin level after drip is started. 3) Daily heparin level and CBC  Gardy Montanari, Tsz-Yin 09/21/2012,8:12 PM

## 2012-09-21 NOTE — Progress Notes (Signed)
Critical CK MB level at 7.5. Dr. Isidoro Donning Text paged. Ancil Linsey RN

## 2012-09-21 NOTE — Progress Notes (Addendum)
ANTICOAGULATION CONSULT NOTE - Initial Consult  Pharmacy Consult for Enoxaparin Indication: R/o pulmonary embolus  No Known Allergies  Patient Measurements:   Patient stated weight 230 lb  Vital Signs: Temp: 97.1 F (36.2 C) (09/29 0516) Temp src: Oral (09/29 0516) BP: 129/110 mmHg (09/29 1146) Pulse Rate: 130  (09/29 1100)  Labs:  Basename 09/21/12 1112 09/21/12 0515  HGB -- 14.5  HCT -- 43.7  PLT -- 147*  APTT -- --  LABPROT -- --  INR -- --  HEPARINUNFRC -- --  CREATININE 1.33 1.24  CKTOTAL -- --  CKMB -- --  TROPONINI -- <0.30    CrCl is unknown because there is no height on file for the current visit.   Medical History: Past Medical History  Diagnosis Date  . CAD (coronary artery disease)   . CHF (congestive heart failure)      Assessment: Pt comes in with CC of SoB. CXR shows some vascular congestion, D-dimer was elevated. CT-PE ordered. Enoxaparin being initiated while R/O of PE.Pt was very disoriented while speaking to him. He is new to [facility] from [place] and had not been weighed, Pt states he was around 230 lbs. He also states he has no history of renal dysfunction and has not been experiencing any bleeding or taken anticoagulants. SCr was 1.33 for CrCl of 39ml/min.    Goal of Therapy:  Monitor platelets by anticoagulation protocol: Yes   Plan:  - Start enoxaparin 100mg  SQ q12h - Monitor for s/sx of bleeding - Follow CBC every 3 days    Vania Rea. Darin Engels.D. Clinical Pharmacist Pager [phone] Phone [phone] 09/21/2012 12:34 PM

## 2012-09-22 DIAGNOSIS — I369 Nonrheumatic tricuspid valve disorder, unspecified: Secondary | ICD-10-CM

## 2012-09-22 DIAGNOSIS — R0609 Other forms of dyspnea: Secondary | ICD-10-CM

## 2012-09-22 DIAGNOSIS — I4891 Unspecified atrial fibrillation: Secondary | ICD-10-CM

## 2012-09-22 HISTORY — DX: Unspecified atrial fibrillation: I48.91

## 2012-09-22 LAB — MRSA PCR SCREENING: MRSA by PCR: NEGATIVE

## 2012-09-22 LAB — CBC
HCT: 43.4 % (ref 39.0–52.0)
MCV: 81.6 fL (ref 78.0–100.0)
RBC: 5.32 MIL/uL (ref 4.22–5.81)
WBC: 3.8 10*3/uL — ABNORMAL LOW (ref 4.0–10.5)

## 2012-09-22 LAB — BASIC METABOLIC PANEL
CO2: 21 mEq/L (ref 19–32)
Glucose, Bld: 85 mg/dL (ref 70–99)
Potassium: 4.7 mEq/L (ref 3.5–5.1)
Sodium: 136 mEq/L (ref 135–145)

## 2012-09-22 LAB — LIPID PANEL
HDL: 37 mg/dL — ABNORMAL LOW (ref >39)
LDL Cholesterol: 89 mg/dL (ref 0–99)
Total CHOL/HDL Ratio: 3.6 RATIO

## 2012-09-22 LAB — PRO B NATRIURETIC PEPTIDE: Pro B Natriuretic peptide (BNP): 3157 pg/mL — ABNORMAL HIGH (ref 0–125)

## 2012-09-22 LAB — CK TOTAL AND CKMB (NOT AT ARMC): Total CK: 132 U/L (ref 7–232)

## 2012-09-22 LAB — TROPONIN I: Troponin I: <0.3 ng/mL (ref <0.30)

## 2012-09-22 MED ORDER — PNEUMOCOCCAL VAC POLYVALENT 25 MCG/0.5ML IJ INJ
0.5000 mL | INJECTION | INTRAMUSCULAR | Status: AC
  Administered 2012-09-23: 0.5 mL via INTRAMUSCULAR
  Filled 2012-09-22: qty 0.5

## 2012-09-22 MED ORDER — DILTIAZEM HCL 100 MG IV SOLR
5.0000 mg/h | INTRAVENOUS | Status: DC
  Administered 2012-09-22 – 2012-09-24 (×2): 5 mg/h via INTRAVENOUS
  Filled 2012-09-22: qty 100

## 2012-09-22 MED ORDER — DIGOXIN 0.25 MG/ML IJ SOLN
0.5000 mg | Freq: Once | INTRAMUSCULAR | Status: AC
  Administered 2012-09-22: 0.5 mg via INTRAVENOUS
  Filled 2012-09-22 (×2): qty 2

## 2012-09-22 MED ORDER — PERFLUTREN LIPID MICROSPHERE
1.0000 mL | INTRAVENOUS | Status: AC | PRN
  Administered 2012-09-22 (×2): 2 mL via INTRAVENOUS
  Filled 2012-09-22: qty 10

## 2012-09-22 MED ORDER — SODIUM CHLORIDE 0.9 % IV BOLUS (SEPSIS)
500.0000 mL | Freq: Once | INTRAVENOUS | Status: AC
  Administered 2012-09-22: 500 mL via INTRAVENOUS

## 2012-09-22 MED ORDER — DIGOXIN 0.25 MG/ML IJ SOLN: 0.2500 mg | Freq: Once | INTRAMUSCULAR | Status: DC

## 2012-09-22 MED ORDER — INFLUENZA VIRUS VACC SPLIT PF IM SUSP
0.5000 mL | INTRAMUSCULAR | Status: AC
  Administered 2012-09-23: 0.5 mL via INTRAMUSCULAR
  Filled 2012-09-22: qty 0.5

## 2012-09-22 MED ORDER — LORAZEPAM 1 MG PO TABS
1.0000 mg | ORAL_TABLET | Freq: Once | ORAL | Status: AC
  Administered 2012-09-22: 1 mg via ORAL
  Filled 2012-09-22: qty 1

## 2012-09-22 NOTE — Progress Notes (Signed)
Pt having spells where he will fall asleep then wakes up and feels short of breath and yells out until someone goes to his room at which point he is almost hyperventilating. Sats 100% on O2 2L [place] and lungs are clear. Patient able to be talked down and breathing returns to normal within a few seconds of entering room. Pt expresses feeling of anxiousness. Benedetto Coons NP notified and orders received. Pt medicated and will cont to monitor.

## 2012-09-22 NOTE — Progress Notes (Signed)
  Echocardiogram 2D Echocardiogram has been performed.  Louis Ford 09/22/2012, 11:58 AM

## 2012-09-22 NOTE — Progress Notes (Signed)
ANTICOAGULATION CONSULT NOTE - Follow Up Consult  Pharmacy Consult for heparin Indication: atrial fibrillation  Labs:  Basename 09/22/12 0328 09/21/12 2038 09/21/12 1543 09/21/12 1112 09/21/12 0515  HGB -- -- -- -- 14.5  HCT -- -- -- -- 43.7  PLT -- -- -- -- 147*  APTT -- -- -- -- --  LABPROT -- -- -- -- --  INR -- -- -- -- --  HEPARINUNFRC 0.49 -- -- -- --  CREATININE -- -- -- 1.33 1.24  CKTOTAL -- 134 144 -- --  CKMB -- 6.6* 7.5* -- --  TROPONINI -- <0.30 <0.30 -- <0.30    Assessment/Plan:  66yo male therapeutic on heparin with initial dosing for Afib.  Will continue gtt at current rate and confirm stable with additional level.  Colleen Can PharmD BCPS 09/22/2012,4:10 AM

## 2012-09-22 NOTE — Progress Notes (Addendum)
Patient IDCaio Ford  male  AVW:[id number]    DOB: [birth year]    DOA: 09/21/2012  PCP: Quitman Livings, MD  Subjective: Patient noticed to be in A. Fib with RVR since last night, HR 110-130's, on my encounter blood pressure 93/72. Patient was started on heparin drip last night and increase the beta blocker with no significant effect on his heart rate. He denies any chest pain, dizziness, palpitations. C/o "just feeling bad"  Objective: Weight change:   Intake/Output Summary (Last 24 hours) at 09/22/12 1503 Last data filed at 09/21/12 1900  Gross per 24 hour  Intake      0 ml  Output    460 ml  Net   -460 ml   Blood pressure 93/74, pulse 121, temperature 97.5 F (36.4 C), temperature source Oral, resp. rate 20, height 5\' 11"  (1.803 m), weight 103.239 kg (227 lb 9.6 oz), SpO2 100.00%.  Physical Exam: General: Alert and awake, oriented x3, not in any acute distress. HEENT: anicteric sclera, pupils reactive to light and accommodation, EOMI CVS: irregularly irregular, tachycardia Chest: clear to auscultation bilaterally, no wheezing, rales or rhonchi Abdomen: soft nontender, nondistended, normal bowel sounds, no organomegaly Extremities: no cyanosis, clubbing. 1+ edema noted bilaterally Neuro: Cranial nerves II-XII intact, no focal neurological deficits  Lab Results: Basic Metabolic Panel:  Lab 09/22/12 8295 09/21/12 1112 09/21/12 0515  NA 136 137 --  K 4.7 5.2* --  CL 103 106 --  CO2 21 20 --  GLUCOSE 85 88 --  BUN 30* 22 --  CREATININE 1.86* 1.33 --  CALCIUM 9.3 9.3 --  MG -- -- 1.7  PHOS -- -- 4.5   CBC:  Lab 09/22/12 0328 09/21/12 0515  WBC 3.8* 4.5  NEUTROABS -- 1.9  HGB 13.7 14.5  HCT 43.4 43.7  MCV 81.6 82.6  PLT 165 147*   Cardiac Enzymes:  Lab 09/22/12 0328 09/21/12 2038 09/21/12 1543  CKTOTAL 132 134 144  CKMB 6.3* 6.6* 7.5*  CKMBINDEX -- -- --  TROPONINI <0.30 <0.30 <0.30   BNP: No components found with this basename: POCBNP:2 CBG: No results found  for this basename: GLUCAP:5 in the last 168 hours   Micro Results: No results found for this or any previous visit (from the past 240 hour(s)).  Studies/Results: Ct Angio Chest Pe W/cm &/or Wo Cm  09/21/2012  *RADIOLOGY REPORT*  Clinical Data: Shortness of breath, elevated D-dimer, evaluate for PE.  Abnormal chest radiograph.  CT ANGIOGRAPHY CHEST  Technique:  Multidetector CT imaging of the chest using the standard protocol during bolus administration of intravenous contrast. Multiplanar reconstructed images including MIPs were obtained and reviewed to evaluate the vascular anatomy.  Contrast: 80mL OMNIPAQUE IOHEXOL 350 MG/ML SOLN  Comparison: Chest radiographs dated 09/21/2012.  Findings: No evidence of pulmonary embolism.  No frank interstitial edema.  However, there are small right and trace left pleural effusions. Trace fluid along the left major fissure.  Small amount fluid along the right major fissure, possibly mildly loculated, accounting for the radiographic abnormality.  Evaluation of the lung parenchyma is constrained by respiratory motion.  No suspicious pulmonary nodules.  No pneumothorax.  Cardiomegaly.  Pericardial calcifications.  Coronary atherosclerosis. Postsurgical changes related to prior CABG.  Right subclavian ICD.  Visualized upper abdomen is poorly evaluated due to motion but notable for possible left renal cysts.  Degenerative changes of the visualized thoracolumbar spine.  IMPRESSION: No evidence of pulmonary embolism.  Cardiomegaly with small right and trace left pleural effusions.  No frank  interstitial edema.  Loculated fluid along the right major fissure, accounting for the radiographic abnormality.   Original Report Authenticated By: Charline Bills, M.D.    Dg Chest Port 1 View  09/21/2012  *RADIOLOGY REPORT*  Clinical Data: Shortness of breath.  PORTABLE CHEST - 1 VIEW  Comparison: None.  Findings: Semi upright portable view of the chest demonstrates marked enlargement  cardiac silhouette. There is a right-sided cardiac ICD.  There is a 2.9 cm ill defined density just medial to the battery device.  This could represent a large vascular structure but a nodular lesion or parenchymal density cannot be excluded.  Hazy interstitial densities in the lower lungs may represent mild edema.  Patient is status post median sternotomy.  IMPRESSION: Cardiomegaly and suspect mild interstitial edema.  Indeterminate nodular density in the right hilar region which is obscured by the ICD battery device.  The findings could represent vascular structures but cannot exclude a parenchymal lesion or nodule.  Recommend further evaluation with a chest CT.  These results were called by telephone on 09/21/2012 at 7:20 a.m. to Dr. Rhunette Croft, who verbally acknowledged these results.   Original Report Authenticated By: Richarda Overlie, M.D.     Medications: Scheduled Meds:    . aspirin EC  81 mg Oral Daily  . digoxin  0.5 mg Intravenous Once  . LORazepam  1 mg Oral Once  . metoprolol  5 mg Intravenous Once  . simvastatin  40 mg Oral q1800  . sodium chloride  500 mL Intravenous Once  . sodium chloride  3 mL Intravenous Q12H  . DISCONTD: digoxin  0.25 mg Intravenous Once  . DISCONTD: enoxaparin (LOVENOX) injection  40 mg Subcutaneous Q24H  . DISCONTD: furosemide  40 mg Intravenous Q12H  . DISCONTD: lisinopril  5 mg Oral Daily  . DISCONTD: metoprolol tartrate  25 mg Oral BID  . DISCONTD: metoprolol tartrate  25 mg Oral QID   Continuous Infusions:    . heparin 1,350 Units/hr (09/21/12 2055)     Assessment/Plan: Principal Problem:  *Atrial fibrillation with RVR with hypotension: New onset, per patient, no prior history - DC'ed metoprolol, Lasix - give 500 cc IV fluid bolus, IV digoxin 0.5mg  x1.  - On heparin drip, cardiology following, if no significant improvement, ? candidate for cardioversion  Active Problems:  Dyspnea, CHF, acute on chronic: BNP 3157 - 2-D echo done today, results,  IV diuresis on hold, lungs clear   Elevated d-dimer: CT angiogram negative for any PE   Accelerated hypertension: Currently hypotensive likely secondary to rapid Afib   Hyperlipidemia: on statin  CAD (coronary artery disease): awaiting the records from [place]   AICD (automatic cardioverter/defibrillator) present   Seizures: not on any antiseizure medication  DVT Prophylaxis:on heparin drip  Code Status:full code  Disposition:not medically ready   LOS: 1 day   RAI,RIPUDEEP M.D. [facility] 09/22/2012, 3:03 PM Pager: [phone]  If 7PM-7AM, please contact night-coverage [email or web address] Password [email or web address]  Addendum:  Discussed with Dr. Dietrich Pates in detail, Transferred patient to 2900 for closer monitoring on IV cardizem gtt and possible TEE and cardioversion in a.m. Cardiology will assume care, will place under Dr. Tenny Craw as attending MD. [facility] will sign off.    RAI,RIPUDEEP M.D. [facility] 09/22/2012, 5:03 PM  Pager: [phone]

## 2012-09-22 NOTE — Progress Notes (Signed)
Pt transferred to 2918 per MD.  Report given to Selena Batten, RN. All pt belongings transferred with pt.  Louis Ford

## 2012-09-22 NOTE — Progress Notes (Signed)
Received order on admit. Will await ambulation orders. Please specify. Thx. Ethelda Chick CES, ACSM

## 2012-09-22 NOTE — Progress Notes (Signed)
Subjective: Patient is breathing some better.  No CP. Objective: Filed Vitals:   09/21/12 1842 09/21/12 2118 09/21/12 2130 09/22/12 0600  BP: 106/80 110/68 117/76 106/67  Pulse: 90 109 101 103  Temp: 97.8 F (36.6 C)  97.4 F (36.3 C) 97.4 F (36.3 C)  TempSrc: Oral     Resp: 21  20 15   Height: 5\' 11"  (1.803 m)     Weight: 227 lb 9.6 oz (103.239 kg)     SpO2: 100%  98% 100%   Weight change:   Intake/Output Summary (Last 24 hours) at 09/22/12 1058 Last data filed at 09/21/12 1900  Gross per 24 hour  Intake    240 ml  Output   1080 ml  Net   -840 ml    General: Alert, awake, oriented x3, in no acute distress Neck:  JVP is normal Heart: Regular rate and rhythm, without murmurs, rubs, gallops.  Lungs: Clear to auscultation.  No rales or wheezes. Exemities:  No edema.   Neuro: Grossly intact, nonfocal.  Tele:  Afib 90s to 120s.  AVg in low 100s  EKG:  Atrial fib. Lab Results: Results for orders placed during the hospital encounter of 09/21/12 (from the past 24 hour(s))  BASIC METABOLIC PANEL     Status: Abnormal   Collection Time   09/21/12 11:12 AM      Component Value Range   Sodium 137  135 - 145 mEq/L   Potassium 5.2 (*) 3.5 - 5.1 mEq/L   Chloride 106  96 - 112 mEq/L   CO2 20  19 - 32 mEq/L   Glucose, Bld 88  70 - 99 mg/dL   BUN 22  6 - 23 mg/dL   Creatinine, Ser 1.61  0.50 - 1.35 mg/dL   Calcium 9.3  8.4 - 09.6 mg/dL   GFR calc non Af Amer 55 (*) >90 mL/min   GFR calc Af Amer 64 (*) >90 mL/min  TSH     Status: Normal   Collection Time   09/21/12 11:12 AM      Component Value Range   TSH 4.426  0.350 - 4.500 uIU/mL  HEMOGLOBIN A1C     Status: Abnormal   Collection Time   09/21/12  3:43 PM      Component Value Range   Hemoglobin A1C 5.8 (*) <5.7 %   Mean Plasma Glucose 120 (*) <117 mg/dL  TROPONIN I     Status: Normal   Collection Time   09/21/12  3:43 PM      Component Value Range   Troponin I <0.30  <0.30 ng/mL  CK TOTAL AND CKMB     Status: Abnormal     Collection Time   09/21/12  3:43 PM      Component Value Range   Total CK 144  7 - 232 U/L   CK, MB 7.5 (*) 0.3 - 4.0 ng/mL   Relative Index 5.2 (*) 0.0 - 2.5  TSH     Status: Normal   Collection Time   09/21/12  3:43 PM      Component Value Range   TSH 3.739  0.350 - 4.500 uIU/mL  TROPONIN I     Status: Normal   Collection Time   09/21/12  8:38 PM      Component Value Range   Troponin I <0.30  <0.30 ng/mL  CK TOTAL AND CKMB     Status: Abnormal   Collection Time   09/21/12  8:38 PM  Component Value Range   Total CK 134  7 - 232 U/L   CK, MB 6.6 (*) 0.3 - 4.0 ng/mL   Relative Index 4.9 (*) 0.0 - 2.5  TROPONIN I     Status: Normal   Collection Time   09/22/12  3:28 AM      Component Value Range   Troponin I <0.30  <0.30 ng/mL  CK TOTAL AND CKMB     Status: Abnormal   Collection Time   09/22/12  3:28 AM      Component Value Range   Total CK 132  7 - 232 U/L   CK, MB 6.3 (*) 0.3 - 4.0 ng/mL   Relative Index 4.8 (*) 0.0 - 2.5  BASIC METABOLIC PANEL     Status: Abnormal   Collection Time   09/22/12  3:28 AM      Component Value Range   Sodium 136  135 - 145 mEq/L   Potassium 4.7  3.5 - 5.1 mEq/L   Chloride 103  96 - 112 mEq/L   CO2 21  19 - 32 mEq/L   Glucose, Bld 85  70 - 99 mg/dL   BUN 30 (*) 6 - 23 mg/dL   Creatinine, Ser 4.09 (*) 0.50 - 1.35 mg/dL   Calcium 9.3  8.4 - 81.1 mg/dL   GFR calc non Af Amer 37 (*) >90 mL/min   GFR calc Af Amer 43 (*) >90 mL/min  HEPARIN LEVEL (UNFRACTIONATED)     Status: Normal   Collection Time   09/22/12  3:28 AM      Component Value Range   Heparin Unfractionated 0.49  0.30 - 0.70 IU/mL  CBC     Status: Abnormal   Collection Time   09/22/12  3:28 AM      Component Value Range   WBC 3.8 (*) 4.0 - 10.5 K/uL   RBC 5.32  4.22 - 5.81 MIL/uL   Hemoglobin 13.7  13.0 - 17.0 g/dL   HCT 91.4  78.2 - 95.6 %   MCV 81.6  78.0 - 100.0 fL   MCH 25.8 (*) 26.0 - 34.0 pg   MCHC 31.6  30.0 - 36.0 g/dL   RDW 21.3 (*) 08.6 - 57.8 %   Platelets  165  150 - 400 K/uL  LIPID PANEL     Status: Abnormal   Collection Time   09/22/12  3:28 AM      Component Value Range   Cholesterol 133  0 - 200 mg/dL   Triglycerides 35  <469 mg/dL   HDL 37 (*) >62 mg/dL   Total CHOL/HDL Ratio 3.6     VLDL 7  0 - 40 mg/dL   LDL Cholesterol 89  0 - 99 mg/dL  PRO B NATRIURETIC PEPTIDE     Status: Abnormal   Collection Time   09/22/12  3:28 AM      Component Value Range   Pro B Natriuretic peptide (BNP) 3157.0 (*) 0 - 125 pg/mL    Studies/Results: @RISRSLT24 @  Medications: Reviewed.   Patient Active Hospital Problem List: Dyspnea (09/21/2012)   Assessment: Improved.   Review of echo shows severe LV dysfunction (15%).  Mild MR.  Mild LAE.  Moderate RAE  Patient with afib of variable rates.  Does not tolerate going fast.  Afib/flutter Will continue rate control  Set up for TEE cardioversion in AM Continue heparin.   Would try low dose dilt now  IV.  If able to cardiovert will switch to  b blocker . Would tx to step down with labile rate  CHF, acute on chronic (09/21/2012)   Assessment: As noted above.  Did diurese some. Does not tolerate rapid afib No records received from [place]. WIll plan medical therapy for now.    Hyperlipidemia (09/21/2012)   Assessment: Continue statin.   CAD (coronary artery disease) (09/21/2012)   Assessment: Remote CABG  No records.  Trop have remained negative.  With remote nature of CABG will need ischemic eval at some pt   But need to address rhythm.  AICD (automatic cardioverter/defibrillator) present (09/21/2012) Not sure what device has     LOS: 1 day   Dietrich Pates 09/22/2012, 10:58 AM

## 2012-09-22 NOTE — Progress Notes (Signed)
ANTICOAGULATION CONSULT NOTE - Initial Consult  Pharmacy Consult for heparin Indication: atrial fibrillation  No Known Allergies  Patient Measurements: Height: 5\' 11"  (180.3 cm) Weight: 227 lb 9.6 oz (103.239 kg) IBW/kg (Calculated) : 75.3  Heparin Dosing Weight: 95 kg  Vital Signs: Temp: 97.4 F (36.3 C) (09/30 0600) BP: 98/73 mmHg (09/30 1128) Pulse Rate: 120  (09/30 1128)  Labs:  Basename 09/22/12 1045 09/22/12 0328 09/21/12 2038 09/21/12 1543 09/21/12 1112 09/21/12 0515  HGB -- 13.7 -- -- -- 14.5  HCT -- 43.4 -- -- -- 43.7  PLT -- 165 -- -- -- 147*  APTT -- -- -- -- -- --  LABPROT -- -- -- -- -- --  INR -- -- -- -- -- --  HEPARINUNFRC 0.51 0.49 -- -- -- --  CREATININE -- 1.86* -- -- 1.33 1.24  CKTOTAL -- 132 134 144 -- --  CKMB -- 6.3* 6.6* 7.5* -- --  TROPONINI -- <0.30 <0.30 <0.30 -- --    Estimated Creatinine Clearance: 49.7 ml/min (by C-G formula based on Cr of 1.86).   Medical History: Past Medical History  Diagnosis Date  . CAD (coronary artery disease)   . CHF (congestive heart failure)     Medications:  Scheduled:     . aspirin EC  81 mg Oral Daily  . digoxin  0.5 mg Intravenous Once  . LORazepam  1 mg Oral Once  . metoprolol  5 mg Intravenous Once  . simvastatin  40 mg Oral q1800  . sodium chloride  500 mL Intravenous Once  . sodium chloride  3 mL Intravenous Q12H  . DISCONTD: digoxin  0.25 mg Intravenous Once  . DISCONTD: enoxaparin (LOVENOX) injection  100 mg Subcutaneous Q12H  . DISCONTD: enoxaparin (LOVENOX) injection  40 mg Subcutaneous Q24H  . DISCONTD: furosemide  40 mg Intravenous Q12H  . DISCONTD: lisinopril  5 mg Oral Daily  . DISCONTD: metoprolol tartrate  25 mg Oral BID  . DISCONTD: metoprolol tartrate  25 mg Oral QID   Infusions:     . heparin 1,350 Units/hr (09/21/12 2055)    Assessment: 66 yo male with atrial fib on heparin therapy.  Heparin level at goal. No bleeding noted.  Goal of Therapy:  Heparin level 0.3-0.7  units/ml Monitor platelets by anticoagulation protocol: Yes   Plan:  1) Continue heparin at 1350 units/hr.  2) Daily heparin level and CBC  Marylouise Stacks 09/22/2012,12:22 PM

## 2012-09-22 NOTE — Progress Notes (Signed)
Utilization review completed.  

## 2012-09-23 LAB — BASIC METABOLIC PANEL
CO2: 20 mEq/L (ref 19–32)
Chloride: 107 mEq/L (ref 96–112)
Creatinine, Ser: 1.49 mg/dL — ABNORMAL HIGH (ref 0.50–1.35)
Glucose, Bld: 74 mg/dL (ref 70–99)
Sodium: 139 mEq/L (ref 135–145)

## 2012-09-23 LAB — CBC
HCT: 39.3 % (ref 39.0–52.0)
Hemoglobin: 13.1 g/dL (ref 13.0–17.0)
MCV: 80.7 fL (ref 78.0–100.0)
WBC: 4.3 10*3/uL (ref 4.0–10.5)

## 2012-09-23 LAB — HEPARIN LEVEL (UNFRACTIONATED): Heparin Unfractionated: 0.4 IU/mL (ref 0.30–0.70)

## 2012-09-23 MED ORDER — FUROSEMIDE 10 MG/ML IJ SOLN
60.0000 mg | Freq: Once | INTRAMUSCULAR | Status: AC
  Administered 2012-09-23: 60 mg via INTRAVENOUS

## 2012-09-23 MED ORDER — FUROSEMIDE 10 MG/ML IJ SOLN
INTRAMUSCULAR | Status: AC
  Filled 2012-09-23: qty 8

## 2012-09-23 MED ORDER — POTASSIUM CHLORIDE CRYS ER 20 MEQ PO TBCR
20.0000 meq | EXTENDED_RELEASE_TABLET | Freq: Once | ORAL | Status: AC
  Administered 2012-09-23: 20 meq via ORAL
  Filled 2012-09-23: qty 1

## 2012-09-23 NOTE — Progress Notes (Signed)
ANTICOAGULATION CONSULT NOTE - Follow-Up Consult  Pharmacy Consult for heparin Indication: atrial fibrillation  No Known Allergies  Labs:  Basename 09/23/12 0506 09/22/12 1045 09/22/12 0328 09/21/12 2038 09/21/12 1543 09/21/12 1112 09/21/12 0515  HGB 13.1 -- 13.7 -- -- -- --  HCT 39.3 -- 43.4 -- -- -- 43.7  PLT 142* -- 165 -- -- -- 147*  APTT -- -- -- -- -- -- --  LABPROT -- -- -- -- -- -- --  INR -- -- -- -- -- -- --  HEPARINUNFRC 0.40 0.51 0.49 -- -- -- --  CREATININE 1.49* -- 1.86* -- -- 1.33 --  CKTOTAL -- -- 132 134 144 -- --  CKMB -- -- 6.3* 6.6* 7.5* -- --  TROPONINI -- -- <0.30 <0.30 <0.30 -- --    Estimated Creatinine Clearance: 62.7 ml/min (by C-G formula based on Cr of 1.49).  Assessment: 66 yo male with atrial fib on heparin therapy.  Heparin level at goal. No bleeding noted.  Goal of Therapy:  Heparin level 0.3-0.7 units/ml Monitor platelets by anticoagulation protocol: Yes   Plan:  1) Continue heparin at 1350 units/hr.  2) Daily heparin level and CBC  Elwin Sleight 09/23/2012,9:26 AM

## 2012-09-23 NOTE — Evaluation (Signed)
Physical Therapy Evaluation Patient Details Name: Louis Ford MRN: [id number] DOB: 1946/11/09 Today's Date: 09/23/2012 Time: 1446-1500 PT Time Calculation (min): 14 min  PT Assessment / Plan / Recommendation Clinical Impression  PAtient s/p afib with RVR and CHF.  Pt. limited today by incr HR.  May need a RW for home use and HHPT f/u.  Continue PT.      PT Assessment  Patient needs continued PT services    Follow Up Recommendations  Home health PT;Supervision/Assistance - 24 hour    Barriers to Discharge        Equipment Recommendations  Rolling walker with 5" wheels    Recommendations for Other Services     Frequency Min 3X/week    Precautions / Restrictions Precautions Precautions: Fall Restrictions Weight Bearing Restrictions: No   Pertinent Vitals/Pain VSS, No pain      Mobility  Bed Mobility Bed Mobility: Rolling Right;Right Sidelying to Sit;Sitting - Scoot to Edge of Bed Rolling Right: 4: Min guard Right Sidelying to Sit: 4: Min guard;HOB flat Sitting - Scoot to [place] of Bed: 4: Min guard Transfers Transfers: Sit to Stand;Stand to Sit Sit to Stand: 4: Min assist;With upper extremity assist;From bed Stand to Sit: 4: Min assist;With upper extremity assist;To chair/3-in-1;With armrests Details for Transfer Assistance: cues for hand placement Ambulation/Gait Ambulation/Gait Assistance: 4: Min assist Ambulation Distance (Feet): 20 Feet Assistive device:  (held onto IV pole) Ambulation/Gait Assistance Details: Patient with slightly widened BOS (legs swollen).  PAtient ambulated needing UE support.   Gait Pattern: Step-through pattern;Decreased stride length;Wide base of support;Shuffle Gait velocity: decreased Stairs: No Wheelchair Mobility Wheelchair Mobility: No              PT Diagnosis: Generalized weakness  PT Problem List: Decreased activity tolerance;Decreased balance;Decreased mobility;Decreased knowledge of use of DME;Decreased safety awareness PT  Treatment Interventions: DME instruction;Gait training;Stair training;Functional mobility training;Therapeutic activities;Therapeutic exercise;Balance training;Patient/family education   PT Goals Acute Rehab PT Goals PT Goal Formulation: With patient Time For Goal Achievement: 09/30/12 Potential to Achieve Goals: Good Pt will go Sit to Stand: Independently PT Goal: Sit to Stand - Progress: Goal set today Pt will Ambulate: 51 - 150 feet;with modified independence;with least restrictive assistive device PT Goal: Ambulate - Progress: Goal set today Pt will Go Up / Down Stairs: 1-2 stairs;with supervision;with least restrictive assistive device PT Goal: Up/Down Stairs - Progress: Goal set today  Visit Information  Last PT Received On: 09/23/12 Assistance Needed: +1    Subjective Data  Subjective: "I walked in the room this am." Patient Stated Goal: To go home   Prior Functioning  Home Living Lives With:  (son-in-law) Available Help at Discharge: Available 24 hours/day (there most of the time per pt) Type of Home: House Home Access: Stairs to enter [employer] of Steps: 2 Entrance Stairs-Rails: Can reach both Home Layout: One level Bathroom Shower/Tub: Tub/shower unit (pt did not use the bench) Bathroom Toilet: Standard Home Adaptive Equipment: Tub transfer bench Prior Function Level of Independence: Independent Able to Take Stairs?: Yes Driving: No Vocation: Retired Musician: No difficulties Dominant Hand: Right    Cognition  Overall Cognitive Status: Appears within functional limits for tasks assessed/performed Arousal/Alertness: Awake/alert Orientation Level: Appears intact for tasks assessed Behavior During Session: [facility] for tasks performed    Extremity/Trunk Assessment Right Lower Extremity Assessment RLE ROM/Strength/Tone: [facility] for tasks assessed Left Lower Extremity Assessment LLE ROM/Strength/Tone: [facility] for tasks assessed   Balance      End of Session PT -  End of Session Equipment Utilized During Treatment: Gait belt Activity Tolerance: Patient tolerated treatment well Patient left: in chair;with call bell/phone within reach Nurse Communication: Mobility status       INGOLD,Corrie Reder 09/23/2012, 3:55 PM  [facility] Acute Rehabilitation [phone] (pager)

## 2012-09-23 NOTE — Progress Notes (Signed)
Patient ID: Louis Ford, male   DOB: 02/21/1946, 66 y.o.   MRN: [id number] Subjective:  Dyspnea improved. No chest pain. Minimal palpitations.  Objective:  Vital Signs in the last 24 hours: Temp:  [97.3 F (36.3 C)-98.3 F (36.8 C)] 97.3 F (36.3 C) (10/01 0730) Pulse Rate:  [40-121] 75  (10/01 0730) Resp:  [8-29] 16  (10/01 0730) BP: (75-123)/(21-83) 103/65 mmHg (10/01 0730) SpO2:  [15 %-100 %] 100 % (10/01 0730) Weight:  [232 lb 12.9 oz (105.6 kg)] 232 lb 12.9 oz (105.6 kg) (10/01 0452)  Intake/Output from previous day: 09/30 0701 - 10/01 0700 In: 621.3 [P.O.:240; I.V.:381.3] Out: 575 [Urine:575] Intake/Output from this shift:    Physical Exam: ill appearing NAD HEENT: Unremarkable Neck:  No JVD, no thyromegally Lungs:  Clear except for basilar rales. HEART:  IRegular rate rhythm, no murmurs, no rubs, no clicks Abd:  soft, positive bowel sounds, no organomegally, no rebound, no guarding Ext:  2 plus pulses, no edema, no cyanosis, no clubbing Skin:  No rashes no nodules Neuro:  CN II through XII intact, motor grossly intact  Lab Results:  Basename 09/23/12 0506 09/22/12 0328  WBC 4.3 3.8*  HGB 13.1 13.7  PLT 142* 165    Basename 09/23/12 0506 09/22/12 0328  NA 139 136  K 4.1 4.7  CL 107 103  CO2 20 21  GLUCOSE 74 85  BUN 33* 30*  CREATININE 1.49* 1.86*    Basename 09/22/12 0328 09/21/12 2038  TROPONINI <0.30 <0.30   Hepatic Function Panel No results found for this basename: PROT,ALBUMIN,AST,ALT,ALKPHOS,BILITOT,BILIDIR,IBILI in the last 72 hours  Basename 09/22/12 0328  CHOL 133   No results found for this basename: PROTIME in the last 72 hours  Imaging: No results found.  Cardiac Studies: Telemetry - Atrial fibrillation/flutter with a controlled ventricular response Assessment/Plan:  1. acute on chronic systolic heart failure with severe left ventricular dysfunction 2. atrial fib/flutter with a rapid, now controlled ventricular response on  intravenous Cardizem 3. Hypertension Rec: We'll continue to diurese and plan to undergo TEE guided EC cardioversion tomorrow. His ventricular rates are much improved. No other changes today. For now, we'll continue intravenous Cardizem.  LOS: 2 days    Lewayne Bunting, M.D. 09/23/2012, 11:01 AM

## 2012-09-24 ENCOUNTER — Encounter (HOSPITAL_COMMUNITY): Payer: Self-pay | Admitting: Anesthesiology

## 2012-09-24 ENCOUNTER — Inpatient Hospital Stay (HOSPITAL_COMMUNITY): Payer: PRIVATE HEALTH INSURANCE | Admitting: Anesthesiology

## 2012-09-24 ENCOUNTER — Encounter (HOSPITAL_COMMUNITY)
Admission: EM | Disposition: A | Discharge status: Home Health | Payer: Self-pay | Source: Home / Self Care | Attending: Internal Medicine

## 2012-09-24 ENCOUNTER — Encounter (HOSPITAL_COMMUNITY): Payer: Self-pay | Admitting: *Deleted

## 2012-09-24 DIAGNOSIS — I4891 Unspecified atrial fibrillation: Secondary | ICD-10-CM

## 2012-09-24 HISTORY — PX: CARDIOVERSION: SHX1299

## 2012-09-24 HISTORY — PX: TEE WITHOUT CARDIOVERSION: SHX5443

## 2012-09-24 LAB — BASIC METABOLIC PANEL
BUN: 32 mg/dL — ABNORMAL HIGH (ref 6–23)
Chloride: 104 mEq/L (ref 96–112)
GFR calc Af Amer: 53 mL/min — ABNORMAL LOW (ref >90)
Potassium: 4.1 mEq/L (ref 3.5–5.1)

## 2012-09-24 LAB — CBC
Hemoglobin: 12.8 g/dL — ABNORMAL LOW (ref 13.0–17.0)
MCH: 27 pg (ref 26.0–34.0)
RBC: 4.74 MIL/uL (ref 4.22–5.81)

## 2012-09-24 SURGERY — ECHOCARDIOGRAM, TRANSESOPHAGEAL
Anesthesia: Moderate Sedation

## 2012-09-24 MED ORDER — PROPOFOL 10 MG/ML IV BOLUS
INTRAVENOUS | Status: DC | PRN
  Administered 2012-09-24: 70 mg via INTRAVENOUS

## 2012-09-24 MED ORDER — SODIUM CHLORIDE 0.9 % IJ SOLN: 3.0000 mL | INTRAMUSCULAR | Status: DC | PRN

## 2012-09-24 MED ORDER — WARFARIN - PHARMACIST DOSING INPATIENT
Freq: Every day | Status: DC
  Administered 2012-09-25: 18:00:00

## 2012-09-24 MED ORDER — FLUMAZENIL 0.5 MG/5ML IV SOLN
INTRAVENOUS | Status: AC
  Filled 2012-09-24: qty 5

## 2012-09-24 MED ORDER — BUTAMBEN-TETRACAINE-BENZOCAINE 2-2-14 % EX AERO
INHALATION_SPRAY | CUTANEOUS | Status: DC | PRN
  Administered 2012-09-24: 2 via TOPICAL

## 2012-09-24 MED ORDER — FUROSEMIDE 10 MG/ML IJ SOLN
80.0000 mg | Freq: Two times a day (BID) | INTRAMUSCULAR | Status: DC
  Administered 2012-09-24 – 2012-09-29 (×12): 80 mg via INTRAVENOUS
  Filled 2012-09-24: qty 8
  Filled 2012-09-24: qty 4
  Filled 2012-09-24 (×13): qty 8

## 2012-09-24 MED ORDER — CARVEDILOL 3.125 MG PO TABS
3.1250 mg | ORAL_TABLET | Freq: Two times a day (BID) | ORAL | Status: DC
  Administered 2012-09-24 – 2012-09-30 (×13): 3.125 mg via ORAL
  Filled 2012-09-24 (×16): qty 1

## 2012-09-24 MED ORDER — SODIUM CHLORIDE 0.9 % IV SOLN
Freq: Once | INTRAVENOUS | Status: AC
  Administered 2012-09-24: 500 mL via INTRAVENOUS

## 2012-09-24 MED ORDER — FLUMAZENIL 0.5 MG/5ML IV SOLN: 0.5000 mg | Freq: Once | INTRAVENOUS | Status: DC

## 2012-09-24 MED ORDER — FENTANYL CITRATE 0.05 MG/ML IJ SOLN
INTRAMUSCULAR | Status: AC
  Filled 2012-09-24: qty 2

## 2012-09-24 MED ORDER — FLUMAZENIL 0.5 MG/5ML IV SOLN
INTRAVENOUS | Status: DC | PRN
  Administered 2012-09-24: 0.5 mg via INTRAVENOUS

## 2012-09-24 MED ORDER — FENTANYL CITRATE 0.05 MG/ML IJ SOLN
INTRAMUSCULAR | Status: DC | PRN
  Administered 2012-09-24 (×4): 25 ug via INTRAVENOUS

## 2012-09-24 MED ORDER — WARFARIN VIDEO
Freq: Once | Status: AC
  Administered 2012-09-24: 14:00:00

## 2012-09-24 MED ORDER — HEPARIN (PORCINE) IN NACL 100-0.45 UNIT/ML-% IJ SOLN
1550.0000 [IU]/h | INTRAMUSCULAR | Status: DC
  Administered 2012-09-24 – 2012-09-25 (×2): 1450 [IU]/h via INTRAVENOUS
  Administered 2012-09-25: 1650 [IU]/h via INTRAVENOUS
  Administered 2012-09-26 – 2012-09-28 (×3): 1550 [IU]/h via INTRAVENOUS
  Filled 2012-09-24 (×10): qty 250

## 2012-09-24 MED ORDER — SODIUM CHLORIDE 0.9 % IV SOLN: 250.0000 mL | INTRAVENOUS | Status: DC | PRN

## 2012-09-24 MED ORDER — SODIUM CHLORIDE 0.9 % IJ SOLN: 3.0000 mL | Freq: Two times a day (BID) | INTRAMUSCULAR | Status: DC

## 2012-09-24 MED ORDER — MIDAZOLAM HCL 10 MG/2ML IJ SOLN
INTRAMUSCULAR | Status: DC | PRN
  Administered 2012-09-24 (×2): 1 mg via INTRAVENOUS
  Administered 2012-09-24 (×2): 2 mg via INTRAVENOUS
  Administered 2012-09-24: 1 mg via INTRAVENOUS
  Administered 2012-09-24: 2 mg via INTRAVENOUS
  Administered 2012-09-24: 1 mg via INTRAVENOUS

## 2012-09-24 MED ORDER — MIDAZOLAM HCL 5 MG/ML IJ SOLN
INTRAMUSCULAR | Status: AC
  Filled 2012-09-24: qty 2

## 2012-09-24 MED ORDER — PATIENT'S GUIDE TO USING COUMADIN BOOK
Freq: Once | Status: AC
  Administered 2012-09-24: 14:00:00
  Filled 2012-09-24: qty 1

## 2012-09-24 MED ORDER — WARFARIN SODIUM 7.5 MG PO TABS
7.5000 mg | ORAL_TABLET | Freq: Once | ORAL | Status: AC
  Administered 2012-09-24: 7.5 mg via ORAL
  Filled 2012-09-24: qty 1

## 2012-09-24 MED ORDER — SODIUM CHLORIDE 0.9 % IV SOLN: INTRAVENOUS | Status: DC

## 2012-09-24 MED ORDER — POTASSIUM CHLORIDE CRYS ER 20 MEQ PO TBCR
20.0000 meq | EXTENDED_RELEASE_TABLET | Freq: Two times a day (BID) | ORAL | Status: DC
  Administered 2012-09-24 – 2012-09-25 (×4): 20 meq via ORAL
  Filled 2012-09-24 (×5): qty 1
  Filled 2012-09-24: qty 2
  Filled 2012-09-24 (×2): qty 1

## 2012-09-24 MED ORDER — LIDOCAINE HCL (CARDIAC) 20 MG/ML IV SOLN
INTRAVENOUS | Status: DC | PRN
  Administered 2012-09-24: 80 mg via INTRAVENOUS

## 2012-09-24 NOTE — H&P (View-Only) (Signed)
    Subjective:  Feels a little better but still with shortness of breath and orthopnea. No chest pain.  Objective:  Vital Signs in the last 24 hours: Temp:  [97.2 F (36.2 C)-98.8 F (37.1 C)] 98.4 F (36.9 C) (10/02 0400) Pulse Rate:  [68-136] 68  (10/01 1500) Resp:  [9-29] 18  (10/02 0700) BP: (88-102)/(54-70) 88/57 mmHg (10/02 0400) SpO2:  [94 %-100 %] 97 % (10/02 0400) Weight:  [107.1 kg (236 lb 1.8 oz)] 107.1 kg (236 lb 1.8 oz) (10/02 0500)  Intake/Output from previous day: 10/01 0701 - 10/02 0700 In: 1404 [P.O.:720; I.V.:684] Out: 1100 [Urine:1100]  Physical Exam: Pt is alert and oriented, pleasant male in NAD HEENT: normal Neck: JVP - elevated, carotids 2+= without bruits Lungs: CTA bilaterally CV: irregular without murmur or gallop Abd: soft, NT, Positive BS, no hepatomegaly Ext: diffuse 2+ edema, distal pulses intact and equal Skin: warm/dry no rash   Lab Results:  Basename 09/24/12 0515 09/23/12 0506  WBC 5.0 4.3  HGB 12.8* 13.1  PLT 150 142*    Basename 09/24/12 0515 09/23/12 0506  NA 137 139  K 4.1 4.1  CL 104 107  CO2 22 20  GLUCOSE 82 74  BUN 32* 33*  CREATININE 1.55* 1.49*    Basename 09/22/12 0328 09/21/12 2038  TROPONINI <0.30 <0.30    Cardiac Studies: 2D Echo: Study Conclusions  - Left ventricle: The cavity size was normal. Wall thickness was increased in a pattern of mild LVH. The estimated ejection fraction was 15%. Diffuse hypokinesis. - Mitral valve: Mild regurgitation. - Left atrium: The atrium was mildly dilated. - Right ventricle: The cavity size was mildly dilated. - Right atrium: The atrium was moderately dilated. - Tricuspid valve: Moderate regurgitation. - Pulmonary arteries: PA peak pressure: 37mm Hg (S).  Tele: atrial fibrillation rate controlled  Assessment/Plan:  1. Acute on chronic systolic CHF - decompensated. Pt is volume-overloaded. Will start furosemide 80 mg IV BID, add carvedilol 3.125 mg BID. Severe LV  dysfunction noted.  2. Atrial fibrillation question duration. Heart rate is ok. Needs anticoagulation - not sure about compliance but he is at very high-risk of stroke. Start warfarin today and begin education process.  3. CAD s/p CABG - no ischemic symptoms.   Tonny Bollman, M.D. 09/24/2012, 7:57 AM

## 2012-09-24 NOTE — Transfer of Care (Signed)
Immediate Anesthesia Transfer of Care Note  Patient: [facility]  Procedure(s) Performed: Procedure(s) (LRB) with comments: TRANSESOPHAGEAL ECHOCARDIOGRAM (TEE) (N/A) - dave/anesth, dl, cindy/echo  moved to 9604 tee/cv--ok'd by dave CARDIOVERSION (N/A)  Patient Location: PACU  Anesthesia Type: MAC  Level of Consciousness: awake, alert  and oriented  Airway & Oxygen Therapy: Patient Spontanous Breathing and Patient connected to nasal cannula oxygen  Post-op Assessment: Report given to PACU RN, Post -op Vital signs reviewed and stable and Patient moving all extremities X 4  Post vital signs: Reviewed and stable  Complications: No apparent anesthesia complications

## 2012-09-24 NOTE — Progress Notes (Signed)
CARDIAC REHAB PHASE I   PRE:  Rate/Rhythm: 66 SR with PACs/PVCs    BP: sitting 112/65    SaO2: 98 RA  MODE:  Ambulation: 140 ft   POST:  Rate/Rhythm: 95 SR (no ectopy)    BP: sitting 126/76     SaO2: 100 2L  Pt with ectopy in bed. Rhythm SR without ectopy walking but PACs/ PVCs returned when pt sat in recliner. Asymptomatic. Used RW and 2L O2. C/o right knee pain. No other c/o. Slight SOB noted. Will f/u. Started Coumadin video.  [id number]  Harriet Masson CES, ACSM

## 2012-09-24 NOTE — CV Procedure (Addendum)
    Transesophageal Echocardiogram Note  Louis Ford [id number] 1946/09/05  Procedure: Transesophageal Echocardiogram Indications: atrial fib  Procedure Details Consent: Obtained Time Out: Verified patient identification, verified procedure, site/side was marked, verified correct patient position, special equipment/implants available, Radiology Safety Procedures followed,  medications/allergies/relevent history reviewed, required imaging and test results available.  Performed  Medications: Fentanyl: 100 mcg IV Versed: 10 mg IV  Left Ventrical:  Severe LV dysfunction, EF ~ 20%  Mitral Valve: mild MR  Aortic Valve: normal AoV  Tricuspid Valve: moderate TR  Pulmonic Valve: no PI  Left Atrium/ Left atrial appendage: no thrombus, good function  Atrial septum: normal   Aorta: mild plaque   Complications: No apparent complications Patient did tolerate procedure well.   Vesta Mixer, Montez Hageman., MD, [facility] 09/24/2012, 12:05 PM     Transesophageal Echocardiogram Note  Louis Ford [id number] 1946-09-16     Cardioversion Note  Louis Ford [id number] February 19, 1946  Procedure: DC Cardioversion Indications: atrial fibrillation  Procedure Details Consent: Obtained Time Out: Verified patient identification, verified procedure, site/side was marked, verified correct patient position, special equipment/implants available, Radiology Safety Procedures followed,  medications/allergies/relevent history reviewed, required imaging and test results available.  Performed  The patient has been on adequate anticoagulation.  The patient received Propofol 70 mg IV for sedation.  Synchronous cardioversion was performed at 120 joules.  The cardioversion was successful.    Complications: No apparent complications Patient did tolerate procedure well.  Pt has a [employer] Lumax 540 VR-T ICD.  He may benefit from ICD interrogation before discharge.   Vesta Mixer, Montez Hageman., MD,  [facility] 09/24/2012, 12:10 PM

## 2012-09-24 NOTE — Interval H&P Note (Signed)
History and Physical Interval Note:  09/24/2012 11:32 AM  [facility]  has presented today for surgery, with the diagnosis of Atrial fibrillation  The various methods of treatment have been discussed with the patient and family. After consideration of risks, benefits and other options for treatment, the patient has consented to  Procedure(s) (LRB) with comments: TRANSESOPHAGEAL ECHOCARDIOGRAM (TEE) (N/A) - dave/anesth, dl, cindy/echo  moved to 9604 tee/cv--ok'd by dave CARDIOVERSION (N/A) as a surgical intervention .  The patient's history has been reviewed, patient examined, no change in status, stable for surgery.  I have reviewed the patient's chart and labs.  Questions were answered to the patient's satisfaction.     Louis Ford.

## 2012-09-24 NOTE — Anesthesia Preprocedure Evaluation (Signed)
Anesthesia Evaluation  Patient identified by MRN, date of birth, ID band  Reviewed: Allergy & Precautions, H&P , NPO status   Airway Mallampati: III      Dental  (+) Edentulous Upper and Poor Dentition   Pulmonary  breath sounds clear to auscultation        Cardiovascular Rhythm:Irregular     Neuro/Psych    GI/Hepatic   Endo/Other    Renal/GU      Musculoskeletal   Abdominal   Peds  Hematology   Anesthesia Other Findings   Reproductive/Obstetrics                           Anesthesia Physical Anesthesia Plan  ASA: III  Anesthesia Plan: General   Post-op Pain Management:    Induction: Intravenous  Airway Management Planned: Mask  Additional Equipment:   Intra-op Plan:   Post-operative Plan:   Informed Consent: I have reviewed the patients History and Physical, chart, labs and discussed the procedure including the risks, benefits and alternatives for the proposed anesthesia with the patient or authorized representative who has indicated his/her understanding and acceptance.     Plan Discussed with: CRNA and Surgeon  Anesthesia Plan Comments:         Anesthesia Quick Evaluation

## 2012-09-24 NOTE — Progress Notes (Signed)
ANTICOAGULATION CONSULT NOTE - Follow-Up Consult  Pharmacy Consult for heparin/coumadin Indication: atrial fibrillation  No Known Allergies  Labs:  Basename 09/24/12 0515 09/23/12 0506 09/22/12 1045 09/22/12 0328 09/21/12 2038 09/21/12 1543  HGB 12.8* 13.1 -- -- -- --  HCT 38.5* 39.3 -- 43.4 -- --  PLT 150 142* -- 165 -- --  APTT -- -- -- -- -- --  LABPROT -- -- -- -- -- --  INR -- -- -- -- -- --  HEPARINUNFRC 0.29* 0.40 0.51 -- -- --  CREATININE 1.55* 1.49* -- 1.86* -- --  CKTOTAL -- -- -- 132 134 144  CKMB -- -- -- 6.3* 6.6* 7.5*  TROPONINI -- -- -- <0.30 <0.30 <0.30    Estimated Creatinine Clearance: 60.7 ml/min (by C-G formula based on Cr of 1.55).  Assessment: 66 yo male with atrial fib on heparin therapy.  Heparin level slightly below goal. Pt is s/p TEE with DCCV. Dr. Earmon Phoenix note said to begin coumadin.   Goal of Therapy:  Heparin level 0.3-0.7 units/ml Monitor platelets by anticoagulation protocol: Yes INR  = 2-3  Plan:   Increase heparin to 1450 units/hr Check 8h heparin level Coumadin 7.5 mg PO x1 Check baseline INR

## 2012-09-24 NOTE — Progress Notes (Signed)
    Subjective:  Feels a little better but still with shortness of breath and orthopnea. No chest pain.  Objective:  Vital Signs in the last 24 hours: Temp:  [97.2 F (36.2 C)-98.8 F (37.1 C)] 98.4 F (36.9 C) (10/02 0400) Pulse Rate:  [68-136] 68  (10/01 1500) Resp:  [9-29] 18  (10/02 0700) BP: (88-102)/(54-70) 88/57 mmHg (10/02 0400) SpO2:  [94 %-100 %] 97 % (10/02 0400) Weight:  [107.1 kg (236 lb 1.8 oz)] 107.1 kg (236 lb 1.8 oz) (10/02 0500)  Intake/Output from previous day: 10/01 0701 - 10/02 0700 In: 1404 [P.O.:720; I.V.:684] Out: 1100 [Urine:1100]  Physical Exam: Pt is alert and oriented, pleasant male in NAD HEENT: normal Neck: JVP - elevated, carotids 2+= without bruits Lungs: CTA bilaterally CV: irregular without murmur or gallop Abd: soft, NT, Positive BS, no hepatomegaly Ext: diffuse 2+ edema, distal pulses intact and equal Skin: warm/dry no rash   Lab Results:  Basename 09/24/12 0515 09/23/12 0506  WBC 5.0 4.3  HGB 12.8* 13.1  PLT 150 142*    Basename 09/24/12 0515 09/23/12 0506  NA 137 139  K 4.1 4.1  CL 104 107  CO2 22 20  GLUCOSE 82 74  BUN 32* 33*  CREATININE 1.55* 1.49*    Basename 09/22/12 0328 09/21/12 2038  TROPONINI <0.30 <0.30    Cardiac Studies: 2D Echo: Study Conclusions  - Left ventricle: The cavity size was normal. Wall thickness was increased in a pattern of mild LVH. The estimated ejection fraction was 15%. Diffuse hypokinesis. - Mitral valve: Mild regurgitation. - Left atrium: The atrium was mildly dilated. - Right ventricle: The cavity size was mildly dilated. - Right atrium: The atrium was moderately dilated. - Tricuspid valve: Moderate regurgitation. - Pulmonary arteries: PA peak pressure: 37mm Hg (S).  Tele: atrial fibrillation rate controlled  Assessment/Plan:  1. Acute on chronic systolic CHF - decompensated. Pt is volume-overloaded. Will start furosemide 80 mg IV BID, add carvedilol 3.125 mg BID. Severe LV  dysfunction noted.  2. Atrial fibrillation question duration. Heart rate is ok. Needs anticoagulation - not sure about compliance but he is at very high-risk of stroke. Start warfarin today and begin education process.  3. CAD s/p CABG - no ischemic symptoms.   Louis Ford, M.D. 09/24/2012, 7:57 AM     

## 2012-09-24 NOTE — Preoperative (Signed)
Beta Blockers   Reason not to administer Beta Blockers:Not Applicable 

## 2012-09-25 ENCOUNTER — Encounter (HOSPITAL_COMMUNITY): Payer: Self-pay | Admitting: Cardiovascular Disease

## 2012-09-25 DIAGNOSIS — I5023 Acute on chronic systolic (congestive) heart failure: Principal | ICD-10-CM

## 2012-09-25 LAB — BASIC METABOLIC PANEL
CO2: 23 mEq/L (ref 19–32)
Calcium: 9.1 mg/dL (ref 8.4–10.5)
GFR calc non Af Amer: 49 mL/min — ABNORMAL LOW (ref >90)
Glucose, Bld: 92 mg/dL (ref 70–99)
Potassium: 4.3 mEq/L (ref 3.5–5.1)
Sodium: 134 mEq/L — ABNORMAL LOW (ref 135–145)

## 2012-09-25 LAB — PROTIME-INR
INR: 1.56 — ABNORMAL HIGH (ref 0.00–1.49)
Prothrombin Time: 18.2 seconds — ABNORMAL HIGH (ref 11.6–15.2)

## 2012-09-25 LAB — CBC
Hemoglobin: 12.3 g/dL — ABNORMAL LOW (ref 13.0–17.0)
MCH: 26.6 pg (ref 26.0–34.0)
MCHC: 33 g/dL (ref 30.0–36.0)
Platelets: 159 10*3/uL (ref 150–400)
RBC: 4.63 MIL/uL (ref 4.22–5.81)

## 2012-09-25 LAB — HEPARIN LEVEL (UNFRACTIONATED)
Heparin Unfractionated: 0.25 IU/mL — ABNORMAL LOW (ref 0.30–0.70)
Heparin Unfractionated: 0.33 IU/mL (ref 0.30–0.70)

## 2012-09-25 MED ORDER — PHENOL 1.4 % MT LIQD
1.0000 | OROMUCOSAL | Status: DC | PRN
  Filled 2012-09-25: qty 177

## 2012-09-25 MED ORDER — ALPRAZOLAM 0.25 MG PO TABS
0.5000 mg | ORAL_TABLET | Freq: Two times a day (BID) | ORAL | Status: DC | PRN
  Administered 2012-09-25 – 2012-09-26 (×2): 0.5 mg via ORAL
  Filled 2012-09-25 (×2): qty 1

## 2012-09-25 MED ORDER — WARFARIN SODIUM 7.5 MG PO TABS
7.5000 mg | ORAL_TABLET | Freq: Once | ORAL | Status: AC
  Administered 2012-09-25: 7.5 mg via ORAL
  Filled 2012-09-25: qty 1

## 2012-09-25 NOTE — Progress Notes (Signed)
ANTICOAGULATION CONSULT NOTE - Follow-Up Consult  Pharmacy Consult for heparin/coumadin Indication: atrial fibrillation  No Known Allergies  Labs:  Basename 09/25/12 0438 09/24/12 1831 09/24/12 0515 09/23/12 0506  HGB 12.3* -- 12.8* --  HCT 37.3* -- 38.5* 39.3  PLT 159 -- 150 142*  APTT -- -- -- --  LABPROT 18.2* 18.4* -- --  INR 1.56* 1.58* -- --  HEPARINUNFRC 0.25* -- 0.29* 0.40  CREATININE -- -- 1.55* 1.49*  CKTOTAL -- -- -- --  CKMB -- -- -- --  TROPONINI -- -- -- --    Estimated Creatinine Clearance: 60.7 ml/min (by C-G formula based on Cr of 1.55).  Assessment: 66 yo male with atrial fib on heparin therapy.  Heparin level is below-goal on 1450 units/hr. Pt is s/p TEE with DCCV. Dr. Earmon Phoenix note said to begin coumadin.   Goal of Therapy:  Heparin level 0.3-0.7 units/ml Monitor platelets by anticoagulation protocol: Yes INR  = 2-3  Plan:  1. Increase IV heparin to 1650 units/hr.  2. Coumadin 7.5mg  po today.  3. Heparin level in 6 hours.   Lorre Munroe, PharmD 09/25/12, 1191 AM

## 2012-09-25 NOTE — Progress Notes (Signed)
ANTICOAGULATION CONSULT NOTE - Follow Up Consult  Pharmacy Consult for Heparin Indication: atrial fibrillation  No Known Allergies  Patient Measurements: Heparin Dosing Weight: 98kg  Vital Signs: Temp: 99.1 F (37.3 C) (10/03 1235) Temp src: Oral (10/03 1235) BP: 102/73 mmHg (10/03 1235) Pulse Rate: 69  (10/03 1235)  Labs:  Basename 09/25/12 1200 09/25/12 0438 09/24/12 1831 09/24/12 0515 09/23/12 0506  HGB -- 12.3* -- 12.8* --  HCT -- 37.3* -- 38.5* 39.3  PLT -- 159 -- 150 142*  APTT -- -- -- -- --  LABPROT -- 18.2* 18.4* -- --  INR -- 1.56* 1.58* -- --  HEPARINUNFRC 0.33 0.25* -- 0.29* --  CREATININE -- 1.48* -- 1.55* 1.49*  CKTOTAL -- -- -- -- --  CKMB -- -- -- -- --  TROPONINI -- -- -- -- --    Estimated Creatinine Clearance: 63.6 ml/min (by C-G formula based on Cr of 1.48).   Medications:  Heparin @ 1650 units/hr  Assessment: 65yom continues on heparin with a therapeutic heparin level after rate increase this morning. Hgb is slowly trending down, platelets are stable. No bleeding.  Goal of Therapy:  Heparin level 0.3-0.7 Monitor platelets by anticoagulation protocol: Yes   Plan:  1) Continue heparin at 1650 units/hr 2) Follow up heparin level in AM  Marleta Lapierre, Hessie Diener 09/25/2012,1:53 PM

## 2012-09-25 NOTE — Care Management Note (Addendum)
    Page 1 of 1   09/29/2012     3:22:06 PM   CARE MANAGEMENT NOTE 09/29/2012  Patient:  Louis Ford, Louis Ford   Account Number:  [id number]  Date Initiated:  09/25/2012  Documentation initiated by:  Junius Creamer  Subjective/Objective Assessment:   adm w at fib w rvr     Action/Plan:   lives w fam, pcp dr Marcelline Deist hassan   Anticipated DC Date:     Anticipated DC Plan:        DC Planning Services  CM consult      Choice offered to / List presented to:          [facility] arranged  HH - 11 Patient Refused      Status of service:   Medicare Important Message given?   (If response is "NO", the following Medicare IM given date fields will be blank) Date Medicare IM given:   Date Additional Medicare IM given:    Discharge Disposition:    Per UR Regulation:  Reviewed for med. necessity/level of care/duration of stay  If discussed at Long Length of Stay Meetings, dates discussed:    Comments:  10/7 15:14p debbie Torre [place] rn,bsn spoke w pt and gave hime hhc agency list. pt states has rw at home and does not want hhc. phy ther here at hospital had rec rw and hhpt. pt does not want hhc.  10/3 8:40a debbie Sophia Cubero rn,bsn [phone] spoke w pt and son in law. pt just moved to [place] from [place]. he has not signed up or  medicare d he says.gave pt 2 prescription med cards that may help w copays for brand name meds. meds pt on in hosp on 4.00 list at [employer] or [facility].

## 2012-09-25 NOTE — Anesthesia Postprocedure Evaluation (Signed)
  Anesthesia Post-op Note  Patient: [facility]  Procedure(s) Performed: Procedure(s) (LRB) with comments: TRANSESOPHAGEAL ECHOCARDIOGRAM (TEE) (N/A) - dave/anesth, dl, cindy/echo  moved to 1610 tee/cv--ok'd by dave CARDIOVERSION (N/A)  Patient Location: Endoscopy Unit  Anesthesia Type: General  Level of Consciousness: awake and sedated  Airway and Oxygen Therapy: Patient Spontanous Breathing and Patient connected to nasal cannula oxygen  Post-op Pain: none  Post-op Assessment: Post-op Vital signs reviewed and Patient's Cardiovascular Status Stable  Post-op Vital Signs: stable  Complications: No apparent anesthesia complications

## 2012-09-25 NOTE — Progress Notes (Signed)
Physical Therapy Treatment Patient Details Name: Louis Ford MRN: [id number] DOB: 1946/10/25 Today's Date: 09/25/2012 Time: 4098-1191 PT Time Calculation (min): 14 min  PT Assessment / Plan / Recommendation Comments on Treatment Session  Patient s/p afib with RVR with decr mobility secondary to decr endurance for activity.  Will benefit from PT to address endurance and balance issues.      Follow Up Recommendations  Home health PT;Supervision/Assistance - 24 hour    Barriers to Discharge        Equipment Recommendations  Rolling walker with 5" wheels    Recommendations for Other Services    Frequency Min 3X/week   Plan Discharge plan remains appropriate;Frequency remains appropriate    Precautions / Restrictions Precautions Precautions: Fall Restrictions Weight Bearing Restrictions: No   Pertinent Vitals/Pain VSS, No pain    Mobility  Bed Mobility Bed Mobility: Rolling Left;Left Sidelying to Sit;Sitting - Scoot to Edge of Bed Rolling Right: Not tested (comment) Rolling Left: 5: Supervision Right Sidelying to Sit: Not tested (comment) Left Sidelying to Sit: 5: Supervision Sitting - Scoot to Edge of Bed: 5: Supervision Details for Bed Mobility Assistance: Took incr time Transfers Transfers: Sit to Stand;Stand to Sit Sit to Stand: With upper extremity assist;From bed;4: Min guard Stand to Sit: 4: Min guard;With upper extremity assist;With armrests;To chair/3-in-1 Details for Transfer Assistance: cues for hand placement Ambulation/Gait Ambulation/Gait Assistance: 4: Min guard Ambulation Distance (Feet): 110 Feet Assistive device:  (held onto IV pole) Ambulation/Gait Assistance Details: Pt. with widened BOS.  Patient ambulated needing UE support but did not want to use RW. Gait Pattern: Step-through pattern;Decreased stride length;Wide base of support;Shuffle;Trunk flexed Gait velocity: decr Stairs: No Wheelchair Mobility Wheelchair Mobility: No     PT Goals Acute  Rehab PT Goals PT Goal: Sit to Stand - Progress: Progressing toward goal PT Goal: Ambulate - Progress: Progressing toward goal  Visit Information  Last PT Received On: 09/25/12 Assistance Needed: +1    Subjective Data  Subjective: "I will try to go down the hall."   Cognition  Overall Cognitive Status: Appears within functional limits for tasks assessed/performed Arousal/Alertness: Awake/alert Orientation Level: Appears intact for tasks assessed Behavior During Session: [facility] for tasks performed    Balance  Static Standing Balance Static Standing - Balance Support: Bilateral upper extremity supported;During functional activity Static Standing - Level of Assistance: 4: Min assist Static Standing - Comment/# of Minutes: 1 minute  End of Session PT - End of Session Equipment Utilized During Treatment: Gait belt Activity Tolerance: Patient limited by fatigue Patient left: in chair;with call bell/phone within reach Nurse Communication: Mobility status       INGOLD,Auryn Paige 09/25/2012, 11:22 AM  [facility] [phone] (pager)

## 2012-09-25 NOTE — Progress Notes (Signed)
Cardiology Progress Note Patient Name: Louis Ford Date of Encounter: 09/25/2012, 10:24 AM     Subjective  Successful TEE/DCCV yesterday. Maintaining NSR. Walked with cardiac rehab without chest pain, slight sob on 2L O2. C/o sore throat from TEE and pain in RUE.  Patient states he was unable to afford his home medications prior to being admitted. Home meds: Vasotec 10mg  daily Simvastatin 40mg  QHS Lasix 40mg  daily Coreg 12.5mg  BID    Objective   Telemetry: sinus rhythm 60-70s  Medications: . sodium chloride   Intravenous Once  . aspirin EC  81 mg Oral Daily  . carvedilol  3.125 mg Oral BID WC  . flumazenil  0.5 mg Intravenous Once  . furosemide  80 mg Intravenous BID  . patient's guide to using coumadin book   Does not apply Once  . potassium chloride  20 mEq Oral BID  . simvastatin  40 mg Oral q1800  . sodium chloride  3 mL Intravenous Q12H  . warfarin  7.5 mg Oral ONCE-1800  . warfarin  7.5 mg Oral ONCE-1800  . warfarin   Does not apply Once  . Warfarin - Pharmacist Dosing Inpatient   Does not apply q1800   . diltiazem (CARDIZEM) infusion 5 mg/hr (09/24/12 0516)  . heparin 1,650 Units/hr (09/25/12 0600)  . DISCONTD: sodium chloride    . DISCONTD: heparin 1,350 Units/hr (09/24/12 1030)    Physical Exam: Temp:  [97.6 F (36.4 C)-98.7 F (37.1 C)] 98.6 F (37 C) (10/03 0821) Pulse Rate:  [64-89] 65  (10/03 0821) Resp:  [5-30] 20  (10/03 0000) BP: (85-158)/(44-95) 95/72 mmHg (10/03 0802) SpO2:  [95 %-100 %] 96 % (10/03 0821)  General: Pleasant elderly black male, in no acute distress. Head: Normocephalic, atraumatic, sclera non-icteric, nares are without discharge.  Neck: Supple. Negative for carotid bruits. (+) JVD Lungs: Fine bibasilar rales. NO wheezes or rhonchi. Breathing is unlabored. Heart: RRR S1 S2 without murmurs, rubs, or gallops.  Abdomen: Soft, non-tender, non-distended with normoactive bowel sounds. No rebound/guarding. No obvious abdominal  masses. Msk:  Strength and tone appear normal for age. Extremities: RUE with ecchymosis, tender to touch. Bilat UE with swelling. BLE tight with 1+ edema. No clubbing or cyanosis. Distal pedal pulses are intact and equal bilaterally. Neuro: Alert and oriented X 3. Moves all extremities spontaneously. Psych:  Responds to questions appropriately with a normal affect.   Intake/Output Summary (Last 24 hours) at 09/25/12 1024 Last data filed at 09/25/12 0800  Gross per 24 hour  Intake 2006.59 ml  Output   4225 ml  Net -2218.41 ml    Labs: [facility] 09/25/12 0438 09/24/12 0515  NA 134* 137  K 4.3 4.1  CL 101 104  CO2 23 22  GLUCOSE 92 82  BUN 31* 32*  CREATININE 1.48* 1.55*  CALCIUM 9.1 8.8   Basename 09/25/12 0438 09/24/12 0515  WBC 3.9* 5.0  HGB 12.3* 12.8*  HCT 37.3* 38.5*  MCV 80.6 81.2  PLT 159 150     09/25/2012 04:38  Prothrombin Time 18.2 (H)  INR 1.56 (H)   Radiology/Studies:   09/24/12 - TEE/DCCV Left Ventrical: Severe LV dysfunction, EF ~ 20%  Mitral Valve: mild MR  Aortic Valve: normal AoV  Tricuspid Valve: moderate TR  Pulmonic Valve: no PI  Left Atrium/ Left atrial appendage: no thrombus, good function  Atrial septum: normal  Aorta: mild plaque * Cardioversion was performed at 120 joules. The cardioversion was successful. * Recommendations: Pt has  a Biotronik Lumax 540 VR-T ICD. He may benefit from ICD interrogation before discharge.  09/22/12 - Echo Study Conclusions: - Left ventricle: The cavity size was normal. Wall thickness was increased in a pattern of mild LVH. The estimated ejection fraction was 15%. Diffuse hypokinesis. - Mitral valve: Mild regurgitation. - Left atrium: The atrium was mildly dilated. - Right ventricle: The cavity size was mildly dilated. - Right atrium: The atrium was moderately dilated. - Tricuspid valve: Moderate regurgitation. - Pulmonary arteries: PA peak pressure: 37mm Hg (S).   09/21/2012  CT ANGIOGRAPHY CHEST     Findings: No evidence of pulmonary embolism.  No frank interstitial edema.  However, there are small right and trace left pleural effusions. Trace fluid along the left major fissure.  Small amount fluid along the right major fissure, possibly mildly loculated, accounting for the radiographic abnormality.  Evaluation of the lung parenchyma is constrained by respiratory motion.  No suspicious pulmonary nodules.  No pneumothorax.  Cardiomegaly.  Pericardial calcifications.  Coronary atherosclerosis. Postsurgical changes related to prior CABG.  Right subclavian ICD.  Visualized upper abdomen is poorly evaluated due to motion but notable for possible left renal cysts.  Degenerative changes of the visualized thoracolumbar spine.  IMPRESSION: No evidence of pulmonary embolism.  Cardiomegaly with small right and trace left pleural effusions.  No frank interstitial edema.  Loculated fluid along the right major fissure, accounting for the radiographic abnormality  09/21/2012  PORTABLE CHEST - 1 VIEW   Findings: Semi upright portable view of the chest demonstrates marked enlargement cardiac silhouette. There is a right-sided cardiac ICD.  There is a 2.9 cm ill defined density just medial to the battery device.  This could represent a large vascular structure but a nodular lesion or parenchymal density cannot be excluded.  Hazy interstitial densities in the lower lungs may represent mild edema.  Patient is status post median sternotomy.  IMPRESSION: Cardiomegaly and suspect mild interstitial edema.  Indeterminate nodular density in the right hilar region which is obscured by the ICD battery device.  The findings could represent vascular structures but cannot exclude a parenchymal lesion or nodule.       Assessment and Plan    1. Acute on chronic systolic CHF: EF 40% with diffuse hypokinesis. Was significantly volume overloaded and dyspneic on admission. Lasix 80mg  IV BID and Coreg 3.125mg  BID started yesterday. Still  with some volume on exam. I/Os net (-) 3L, (-) 2.5L yesterday. Weight 236 -->231.  Crt stable. Further diuresis per MD recs. BP soft. Start ACEI/ARB when BP tolerates. CMET in am. May benefit from CHF clinic follow up at discharge.  2. Atrial fibrillation/flutter w/ RVR: Unsure of onset. Successful TEE guided DCCV yesterday. Maintaining NSR. Still on IV Cardizem. MD to advise on switching to oral dosing today. CHADS2 score 3 for CHF, HTN, CVA. Warfarin started yesterday. INR 1.56, pharmacy managing. Still on IV heparin.  3. CAD: s/p CABG >35yrs ago. No ischemic symptoms. Cardiac enzymes normal  4. Cardiomyopathy: s/p Biotronik Lumax 540 VR-T ICD (placed at [facility] in [place] ?year, change out at [employer] ?year). May benefit from interrogation prior to discharge.  5. Hyperlipidemia: LDL 89. Hepatic function panel in am. Cont statin.  6. Hypertension: BP soft on current meds, SBP 80s-110s.  7. RUE swelling/ecchymosis/tenderness: Patient is on heparin and coumadin, but c/o RUE pain where an old IV was. Ecchymosis and swelling noted. Consider RUE [place] to r/o thrombosis.   8. Sore throat: Chloraseptic throat spray  9. Medication Noncompliance: Patient has been unable to afford his medications prior to admission. Will consult case management and try to prescribe affordable medications at discharge. ? Candidate for 34 days free heart failure meds.  Signed, HOPE, JESSICA PA-C  Patient seen, examined. Available data reviewed. Agree with findings, assessment, and plan as outlined by [facility], PA-C. Overall the patient is making progress with good diuresis since starting on scheduled IV furosemide. He has a right forearm hematoma from an IV site and we will place an ACE wrap around this. Will involve the case manager for med assistance and will try to get on generic drugs. Still volume overloaded - continue IV diuresis.  Tonny Bollman, M.D. 09/25/2012 3:59 PM

## 2012-09-25 NOTE — Progress Notes (Signed)
CARDIAC REHAB PHASE I   PRE:  Rate/Rhythm: 72 SR    BP: sitting 16109    SaO2: 98 RA  MODE:  Ambulation: 200 ft   POST:  Rate/Rhythm: 95 SR    BP: sitting 60454     SaO2: 99 RA  Stronger today. Used RW. C/o SOB. Rhythm stable without ectopy. To recliner after walk with legs elevated. Will continue to follow.   [id number] Harriet Masson CES, ACSM

## 2012-09-26 ENCOUNTER — Encounter (HOSPITAL_COMMUNITY): Payer: Self-pay | Admitting: Physician Assistant

## 2012-09-26 ENCOUNTER — Inpatient Hospital Stay (HOSPITAL_COMMUNITY): Payer: PRIVATE HEALTH INSURANCE

## 2012-09-26 DIAGNOSIS — M542 Cervicalgia: Secondary | ICD-10-CM

## 2012-09-26 DIAGNOSIS — R1310 Dysphagia, unspecified: Secondary | ICD-10-CM | POA: Diagnosis not present

## 2012-09-26 DIAGNOSIS — R9389 Abnormal findings on diagnostic imaging of other specified body structures: Secondary | ICD-10-CM | POA: Diagnosis not present

## 2012-09-26 LAB — CBC
HCT: 38.6 % — ABNORMAL LOW (ref 39.0–52.0)
MCHC: 32.9 g/dL (ref 30.0–36.0)
MCV: 80.4 fL (ref 78.0–100.0)
Platelets: 171 10*3/uL (ref 150–400)
RDW: 16.6 % — ABNORMAL HIGH (ref 11.5–15.5)
WBC: 4.6 10*3/uL (ref 4.0–10.5)

## 2012-09-26 LAB — COMPREHENSIVE METABOLIC PANEL
BUN: 30 mg/dL — ABNORMAL HIGH (ref 6–23)
CO2: 26 mEq/L (ref 19–32)
Calcium: 9.1 mg/dL (ref 8.4–10.5)
Creatinine, Ser: 1.49 mg/dL — ABNORMAL HIGH (ref 0.50–1.35)
GFR calc Af Amer: 56 mL/min — ABNORMAL LOW (ref >90)
GFR calc non Af Amer: 48 mL/min — ABNORMAL LOW (ref >90)
Glucose, Bld: 88 mg/dL (ref 70–99)
Total Protein: 7.4 g/dL (ref 6.0–8.3)

## 2012-09-26 MED ORDER — POTASSIUM CHLORIDE 20 MEQ/15ML (10%) PO LIQD
20.0000 meq | Freq: Two times a day (BID) | ORAL | Status: DC
  Administered 2012-09-26 – 2012-10-10 (×27): 20 meq via ORAL
  Filled 2012-09-26 (×33): qty 15

## 2012-09-26 MED ORDER — WARFARIN SODIUM 10 MG PO TABS
10.0000 mg | ORAL_TABLET | Freq: Once | ORAL | Status: DC
  Filled 2012-09-26: qty 1

## 2012-09-26 MED ORDER — GUAIFENESIN 100 MG/5ML PO SOLN
200.0000 mg | ORAL | Status: DC | PRN
  Administered 2012-09-26 (×2): 200 mg via ORAL
  Filled 2012-09-26 (×2): qty 118

## 2012-09-26 MED ORDER — SODIUM CHLORIDE 0.9 % IV SOLN
20.0000 mg | Freq: Once | INTRAVENOUS | Status: AC
  Administered 2012-09-26: 20 mg via INTRAVENOUS
  Filled 2012-09-26: qty 2

## 2012-09-26 MED ORDER — CLINDAMYCIN PHOSPHATE 600 MG/50ML IV SOLN
600.0000 mg | Freq: Three times a day (TID) | INTRAVENOUS | Status: AC
  Administered 2012-09-26 – 2012-10-03 (×21): 600 mg via INTRAVENOUS
  Filled 2012-09-26 (×21): qty 50

## 2012-09-26 NOTE — Progress Notes (Signed)
PT Cancellation Note  Treatment cancelled today due to patient's refusal to participate.  Louis Ford,Louis Ford 09/26/2012, 11:55 AM  [facility] [phone] (pager)

## 2012-09-26 NOTE — Progress Notes (Signed)
ANTICOAGULATION CONSULT NOTE - Follow Up Consult  Pharmacy Consult for Heparin Indication: atrial fibrillation  No Known Allergies  Patient Measurements: Heparin Dosing Weight: 98kg  Vital Signs: Temp: 97.9 F (36.6 C) (10/04 0730) Temp src: Oral (10/04 0730) BP: 115/74 mmHg (10/04 0730) Pulse Rate: 65  (10/04 0730)  Labs:  Basename 09/26/12 0455 09/25/12 1200 09/25/12 0438 09/24/12 1831 09/24/12 0515  HGB 12.7* -- 12.3* -- --  HCT 38.6* -- 37.3* -- 38.5*  PLT 171 -- 159 -- 150  APTT -- -- -- -- --  LABPROT 18.1* -- 18.2* 18.4* --  INR 1.55* -- 1.56* 1.58* --  HEPARINUNFRC 0.56 0.33 0.25* -- --  CREATININE 1.49* -- 1.48* -- 1.55*  CKTOTAL -- -- -- -- --  CKMB -- -- -- -- --  TROPONINI -- -- -- -- --    Estimated Creatinine Clearance: 61.7 ml/min (by C-G formula based on Cr of 1.49).   Medications:  Heparin @ 1650 units/hr  Assessment: 65yom continues on heparin with a therapeutic heparin level after rate increase yesterday. Hgb is stabilizing, platelets are stable. No bleeding noted. Will continue at current rate. INR relatively unchanged after two doses of 7.5, will increase dose this evening and continue to follow INR.  Goal of Therapy:  Heparin level 0.3-0.7 Monitor platelets by anticoagulation protocol: Yes   Plan:  1) Continue heparin at 1650 units/hr 2) Follow up heparin level/INR in AM 3) Warfarin 10mg  tonight  Louis Ford 09/26/2012,9:09 AM

## 2012-09-26 NOTE — Progress Notes (Addendum)
    Subjective:  Pain with swallowing and odynophagia. No dyspnea.  Objective:  Vital Signs in the last 24 hours: Temp:  [97.7 F (36.5 C)-99.1 F (37.3 C)] 97.9 F (36.6 C) (10/04 0730) Pulse Rate:  [65-88] 65  (10/04 0730) Resp:  [19-21] 19  (10/04 0730) BP: (102-115)/(57-76) 115/74 mmHg (10/04 0730) SpO2:  [95 %-100 %] 100 % (10/04 0730) Weight:  [101.7 kg (224 lb 3.3 oz)] 101.7 kg (224 lb 3.3 oz) (10/04 0400)  Intake/Output from previous day: 10/03 0701 - 10/04 0700 In: 1721.5 [P.O.:1140; I.V.:559.5; IV Piggyback:22] Out: 5500 [Urine:5500]  Physical Exam: Pt is alert and oriented, NAD HEENT: normal Neck: JVP - normal, carotids 2+= without bruits Lungs: CTA bilaterally CV: RRR without murmur or gallop Abd: soft, NT, Positive BS, no hepatomegaly Ext: diffuse edema improving, distal pulses intact and equal Skin: warm/dry no rash   Lab Results:  Basename 09/26/12 0455 09/25/12 0438  WBC 4.6 3.9*  HGB 12.7* 12.3*  PLT 171 159    Basename 09/26/12 0455 09/25/12 0438  NA 136 134*  K 4.3 4.3  CL 101 101  CO2 26 23  GLUCOSE 88 92  BUN 30* 31*  CREATININE 1.49* 1.48*   No results found for this basename: TROPONINI:2,CK,MB:2 in the last 72 hours  Tele: sinus rhythm, PAC's  Assessment/Plan:  1. Acute/chronic systolic HF, LVEF 15%. Diuresing well with IV furosemide. BP remains soft but stable. Continue lasix and low-dose carvedilol. Should add ACE in the next few days but probably best to hold off today.  2. Atrial fib - maintaining sinus after DCCV. Continue IV heparin and warfarin. Education given.  3. Odynophagia/dysphagia - I'm going to ask GI to evaluate. Concerned about symptoms occuring after TEE. I think we have to keep him on heparin/warfarin after cardioversion. Will check noncontrast CT to rule out pneumomediastinum.  4. RUE swelling - secondary to hematoma.  Tonny Bollman, M.D. 09/26/2012, 8:03 AM

## 2012-09-26 NOTE — Consult Note (Signed)
Reason for Consult: Dysphagia and odynophagia Referring Physician: Stan Head, MD  HPI:  Louis Ford is an 66 y.o. male who was admitted on 09/21/12 with orthopnea, LE edema, and rapid A Fib. He underwent TEE on 09/24/12. No difficulty was noted during the procedure. On the day after the procedure, pt began c/o dysphagia and odynophagia.  He feels discomfort on swallowing and food.  Liquid is not going down easily, hangs up in upper neck region. Has some discomfort in the neck even when not swallowing. No previous issues with swallowing or heartburn/dyspepsia.  On his neck CT scan, pronounced prevertebral soft tissue swelling is noted.  There is also tonsillar enlargement left more than right that could be due to tonsillitis. ENT is consulted for further evaluation. Pt is currently not on any antibiotics.   Past Medical History  Diagnosis Date  . CAD (coronary artery disease)   . CHF (congestive heart failure)   . Ischemic cardiomyopathy     EF 15 to 20% by TTE and TEE in 09/2012.  Severe LV dysfunction  . HTN (hypertension)   . Hyperlipidemia   . Obesity     BMI 31 in 09/2012    Past Surgical History  Procedure Date  . Coronary artery bypass graft   . Tee without cardioversion 09/24/2012    Procedure: TRANSESOPHAGEAL ECHOCARDIOGRAM (TEE);  Surgeon: Vesta Mixer, MD;  Location: [facility] ENDOSCOPY;  Service: Cardiovascular;  Laterality: N/A;  dave/anesth, dl, cindy/echo   . Cardioversion 09/24/2012    Procedure: CARDIOVERSION;  Surgeon: Vesta Mixer, MD;  Location: [facility] ENDOSCOPY;  Service: Cardiovascular;  Laterality: N/A;  . Icd     History reviewed. No pertinent family history.  Social History:  reports that he quit smoking about 4 years ago. His smoking use included Pipe. He quit smokeless tobacco use about 4 years ago. He reports that he does not drink alcohol or use illicit drugs.  Allergies: No Known Allergies  Medications:  I have reviewed the patient's current  medications. Scheduled:   . aspirin EC  81 mg Oral Daily  . carvedilol  3.125 mg Oral BID WC  . flumazenil  0.5 mg Intravenous Once  . furosemide  80 mg Intravenous BID  . potassium chloride  20 mEq Oral BID  . simvastatin  40 mg Oral q1800  . sodium chloride  3 mL Intravenous Q12H  . DISCONTD: potassium chloride  20 mEq Oral BID  . DISCONTD: warfarin  10 mg Oral ONCE-1800  . DISCONTD: Warfarin - Pharmacist Dosing Inpatient   Does not apply q1800   AVW:UJWJXB chloride, acetaminophen, ALPRAZolam, guaifenesin, ondansetron (ZOFRAN) IV, phenol, sodium chloride  Results for orders placed during the hospital encounter of 09/21/12 (from the past 48 hour(s))  PROTIME-INR     Status: Abnormal   Collection Time   09/24/12  6:31 PM      Component Value Range Comment   Prothrombin Time 18.4 (*) 11.6 - 15.2 seconds    INR 1.58 (*) 0.00 - 1.49   HEPARIN LEVEL (UNFRACTIONATED)     Status: Abnormal   Collection Time   09/25/12  4:38 AM      Component Value Range Comment   Heparin Unfractionated 0.25 (*) 0.30 - 0.70 IU/mL   CBC     Status: Abnormal   Collection Time   09/25/12  4:38 AM      Component Value Range Comment   WBC 3.9 (*) 4.0 - 10.5 K/uL    RBC 4.63  4.22 -  5.81 MIL/uL    Hemoglobin 12.3 (*) 13.0 - 17.0 g/dL    HCT 16.1 (*) 09.6 - 52.0 %    MCV 80.6  78.0 - 100.0 fL    MCH 26.6  26.0 - 34.0 pg    MCHC 33.0  30.0 - 36.0 g/dL    RDW 04.5 (*) 40.9 - 15.5 %    Platelets 159  150 - 400 K/uL   BASIC METABOLIC PANEL     Status: Abnormal   Collection Time   09/25/12  4:38 AM      Component Value Range Comment   Sodium 134 (*) 135 - 145 mEq/L    Potassium 4.3  3.5 - 5.1 mEq/L    Chloride 101  96 - 112 mEq/L    CO2 23  19 - 32 mEq/L    Glucose, Bld 92  70 - 99 mg/dL    BUN 31 (*) 6 - 23 mg/dL    Creatinine, Ser 8.11 (*) 0.50 - 1.35 mg/dL    Calcium 9.1  8.4 - 91.4 mg/dL    GFR calc non Af Amer 49 (*) >90 mL/min    GFR calc Af Amer 56 (*) >90 mL/min   PROTIME-INR     Status:  Abnormal   Collection Time   09/25/12  4:38 AM      Component Value Range Comment   Prothrombin Time 18.2 (*) 11.6 - 15.2 seconds    INR 1.56 (*) 0.00 - 1.49   MAGNESIUM     Status: Normal   Collection Time   09/25/12  4:38 AM      Component Value Range Comment   Magnesium 1.6  1.5 - 2.5 mg/dL   HEPARIN LEVEL (UNFRACTIONATED)     Status: Normal   Collection Time   09/25/12 12:00 PM      Component Value Range Comment   Heparin Unfractionated 0.33  0.30 - 0.70 IU/mL   HEPARIN LEVEL (UNFRACTIONATED)     Status: Normal   Collection Time   09/26/12  4:55 AM      Component Value Range Comment   Heparin Unfractionated 0.56  0.30 - 0.70 IU/mL   CBC     Status: Abnormal   Collection Time   09/26/12  4:55 AM      Component Value Range Comment   WBC 4.6  4.0 - 10.5 K/uL    RBC 4.80  4.22 - 5.81 MIL/uL    Hemoglobin 12.7 (*) 13.0 - 17.0 g/dL    HCT 78.2 (*) 95.6 - 52.0 %    MCV 80.4  78.0 - 100.0 fL    MCH 26.5  26.0 - 34.0 pg    MCHC 32.9  30.0 - 36.0 g/dL    RDW 21.3 (*) 08.6 - 15.5 %    Platelets 171  150 - 400 K/uL   PROTIME-INR     Status: Abnormal   Collection Time   09/26/12  4:55 AM      Component Value Range Comment   Prothrombin Time 18.1 (*) 11.6 - 15.2 seconds    INR 1.55 (*) 0.00 - 1.49   COMPREHENSIVE METABOLIC PANEL     Status: Abnormal   Collection Time   09/26/12  4:55 AM      Component Value Range Comment   Sodium 136  135 - 145 mEq/L    Potassium 4.3  3.5 - 5.1 mEq/L    Chloride 101  96 - 112 mEq/L    CO2 26  19 -  32 mEq/L    Glucose, Bld 88  70 - 99 mg/dL    BUN 30 (*) 6 - 23 mg/dL    Creatinine, Ser 1.61 (*) 0.50 - 1.35 mg/dL    Calcium 9.1  8.4 - 09.6 mg/dL    Total Protein 7.4  6.0 - 8.3 g/dL    Albumin 3.4 (*) 3.5 - 5.2 g/dL    AST 17  0 - 37 U/L    ALT 15  0 - 53 U/L    Alkaline Phosphatase 104  39 - 117 U/L    Total Bilirubin 1.1  0.3 - 1.2 mg/dL    GFR calc non Af Amer 48 (*) >90 mL/min    GFR calc Af Amer 56 (*) >90 mL/min     Ct Soft Tissue Neck  Wo Contrast  09/26/2012  *RADIOLOGY REPORT*  Clinical Data:  Neck pain.  Chest pain with swallowing.  Dysphagia. ASSESS FOR PNEUMOMEDIASTINUM.  CT NECK AND CHEST WITHOUT CONTRAST  Technique:  Multidetector CT imaging of the neck and chest was performed using the standard protocol without intravenous contrast.  Comparison:  09/21/2012  CT NECK  Findings:  Limited visualization of the intracranial contents does not show any acute finding.  There is atherosclerotic calcification.  There is pronounced prevertebral soft tissue swelling.  This is most consistent with inflammatory disease.  There is tonsillar enlargement left more than right that could be due to tonsillitis. There is no extraluminal air / gas.  Thyroid gland is normal. Submandibular glands are normal.  Parotid glands are normal.  No definable lymphadenopathy.  Ordinary cervical spondylosis is present.  IMPRESSION: Pronounced prevertebral soft tissue swelling most consistent with inflammatory disease.  Tonsillar enlargement asymmetrically more prominent on the left, also probably inflammatory.  Detail is limited in the absence of contrast administration.  I do not see a low density collection to suggest frank abscess, but sensitivity is limited without contrast.  CT CHEST  Findings: There is no evidence of acute mediastinal pathology.  No extraluminal gas.  There has been previous median sternotomy. Pacemaker is in place.  The heart is enlarged.  Chronic pericardial calcification again noted.  There is less pleural fluid when compared to the previous study.  Patchy pulmonary opacities in the lower lobes not seen previously could represent developing pneumonia. No spinal lesion of significance.  IMPRESSION: No pneumomediastinum.  Less pleural fluid than seen previously.  Newly seen patchy density in both lung bases that could represent developing pneumonia.   Original Report Authenticated By: Thomasenia Sales, M.D.    Ct Chest Wo Contrast  09/26/2012   *RADIOLOGY REPORT*  Clinical Data:  Neck pain.  Chest pain with swallowing.  Dysphagia. ASSESS FOR PNEUMOMEDIASTINUM.  CT NECK AND CHEST WITHOUT CONTRAST  Technique:  Multidetector CT imaging of the neck and chest was performed using the standard protocol without intravenous contrast.  Comparison:  09/21/2012  CT NECK  Findings:  Limited visualization of the intracranial contents does not show any acute finding.  There is atherosclerotic calcification.  There is pronounced prevertebral soft tissue swelling.  This is most consistent with inflammatory disease.  There is tonsillar enlargement left more than right that could be due to tonsillitis. There is no extraluminal air / gas.  Thyroid gland is normal. Submandibular glands are normal.  Parotid glands are normal.  No definable lymphadenopathy.  Ordinary cervical spondylosis is present.  IMPRESSION: Pronounced prevertebral soft tissue swelling most consistent with inflammatory disease.  Tonsillar enlargement asymmetrically more prominent  on the left, also probably inflammatory.  Detail is limited in the absence of contrast administration.  I do not see a low density collection to suggest frank abscess, but sensitivity is limited without contrast.  CT CHEST  Findings: There is no evidence of acute mediastinal pathology.  No extraluminal gas.  There has been previous median sternotomy. Pacemaker is in place.  The heart is enlarged.  Chronic pericardial calcification again noted.  There is less pleural fluid when compared to the previous study.  Patchy pulmonary opacities in the lower lobes not seen previously could represent developing pneumonia. No spinal lesion of significance.  IMPRESSION: No pneumomediastinum.  Less pleural fluid than seen previously.  Newly seen patchy density in both lung bases that could represent developing pneumonia.   Original Report Authenticated By: Thomasenia Sales, M.D.    ROS:  Periods of agitation and confusion which have resolved   Chronic B LE edema. Feet are pruritic and skin there is scaly. No non-healing ulcers or  No blurry vision but tearing from eyes is a problem  No musculoskeletal pain  No black or tarry stools  No hx of anemia or transfusions.  Appetite good PTA. No dramatic weight fluctuation.  No falls or balance issues. No headaches.   Physical Exam: Blood pressure 116/57, pulse 73, temperature 98 F (36.7 C), temperature source Oral, resp. rate 20, height 5\' 11"  (1.803 m), weight 101.7 kg (224 lb 3.3 oz), SpO2 97.00%.  General: Pleasant older male in NAD. A&O x 3. Anxious. Head: No facial asymmetry or swelling  Eyes: No conj pallor or icterus. His pupils are equal, round, reactive to light. Extraocular motion is intact. ENT: Examination of the ears shows normal auricles and external auditory canals bilaterally. Nasal examination shows congested mucosa, septum, turbinates. Facial examination shows no asymmetry. Palpation of the face elicit no significant tenderness. Oral cavity examination shows significant edema and erythema or the left tonsils. Palpation of the neck reveals no lymphadenopathy or mass. The trachea is midline. The thyroid is not significantly enlarged. Cranial nerves 2-12 are all grossly intact.  Procedure:  Flexible Fiberoptic Laryngoscopy Anesthesia: Topical oxymetazoline and lidocaine Indication: Dysphagia and odynophagia.[name] Description: Risks, benefits, and alternatives of flexible endoscopy were explained to the patient. Specific mention was made of the risk of throat numbness with difficulty swallowing, possible bleeding from the nose and mouth, and pain from the procedure.  The patient gave oral consent to proceed.  The nasal cavities were decongested and anesthetised with a combination of oxymetazoline and 4% lidocaine solution.  The flexible scope was inserted into the right nasal cavity and advanced towards the nasopharynx.  Visualized mucosa over the turbinates and septum were  normal.  The nasopharynx was clear.  Oropharyngeal walls were asymmetric with enlarged left tonsils. Hypopharynx was also edematous on the left side.  Larynx was mobile without lesions.  No lesions or asymmetry in the supraglottic larynx.  Arytenoid mucosa was edematous.  Posterior commissure with edema and redundant mucosa.  True vocal folds were pale yellow and edematous but without mass or lesion.    Neck: No crepitus. Upper neck tender to palpation  Lungs: Clear in back and front. Pt constantly clearing throat c/w inablility to swallow secretions effectively.  Heart: RRR. No MRG. Defibrillator palpable on right upper chest.  Abdomen: Soft, NT, mildly obese. No mass or HSM. BS active. .  Rectal: deferred  Musc/Skeltl: no gross joint deformity or contractures  Extremities: TEnse right > left sided LE edema, extends at  least to knees. It pits to 3 plus and is associated with dermatitis on both feet (fungal dermatitis)  Neurologic: Cooperative, not confused, provides good history, seems reliable. Oriented x 3.  Skin: as per extremity exam  Tattoos: None seen  Nodes: No cervical adenopathy  Psych: Pleasant, affect subdued. Not anxious or agitated.   Assessment/Plan: Edematous and erythematous left tonsils, likely an acute infectious/inflammatory process. I would recommend starting the patient on IV clindamycin and 1 dose of decadron. No drainable abscess. Will follow while pt is symptomatic.  Louis Ford,SUI W 09/26/2012, 5:23 PM

## 2012-09-26 NOTE — Consult Note (Signed)
[facility] Consult: 9:11 AM 09/26/2012   Referring Provider: Dr Tonny Bollman Primary Care Physician:  [facility].  Primary Gastroenterologist:  None   Reason for Consultation:  Odyno/Dysphagia.   HPI:  Patient is Louis Ford 66 year old African American male who relocated from [place] in June 2013.  Medical history of coronary disease status post Ford, AICD, hypertension, hyperlipidemia, CHF/cardiomyopathy, CVA ?, seizures?.  Admitted 9/29 with orthopnea, LE edema, pBNP 2267 and rapid [place] Fib. Was taking no meds, had never filled Rx from July 2013 visit to [facility], as unable to pay for meds of afford $50 office visit copay.   Started on Heparin. Echo shows EF of 15% to 20% with severe LV dysfx. TEE/DCCV on 10/2. No difficulty passing scope or blood on scope post procedure noted.  Started back on Heparin afterwards.  Coumadin started 10/2.   Maintaining sinus rhythm so far.  Now c/o odynophagia and dysphagia.  The first day post TEE he was swallowing without incidence.  Problem began last evening.  He feels discomfort on swallowing and food >> liquid is not going down easily, hangs up in upper neck region.  Has some discomfort in the neck even when not swallowing. No previous issues with swallowing or heartburn/dyspepsia.  No prior EGD or colonoscopy.  Additionally he reports dripping of blood from left nostril since last evening.    ROS: Periods of agitation and confusion which have resolved Chronic B LE edema.  Feet are pruritic and skin there is scaly.  No non-healing ulcers or  No blurry vision but tearing from eyes is Louis Ford problem No musculoskeletal pain No black or tarry stools No hx of anemia or transfusions.   Appetite good PTA.  No dramatic weight fluctuation.  No falls or balance issues.  No headaches.  No hx of liver, pancreatic or GB problems.   Hx same Iva Boop, MD, [facility]   Past Medical History  Diagnosis Date  . CAD  (coronary artery disease)   . CHF (congestive heart failure)   . Ischemic cardiomyopathy     EF 15 to 20% by TTE and TEE in 09/2012.  Severe LV dysfunction  . HTN (hypertension)   . Hyperlipidemia   . Obesity     BMI 31 in 09/2012    Past Surgical History  Procedure Date  . Coronary artery bypass graft   . Tee without cardioversion 09/24/2012    Procedure: TRANSESOPHAGEAL ECHOCARDIOGRAM (TEE);  Surgeon: Vesta Mixer, MD;  Location: [facility] ENDOSCOPY;  Service: Cardiovascular;  Laterality: N/Louis Ford;  dave/anesth, dl, cindy/echo   . Cardioversion 09/24/2012    Procedure: CARDIOVERSION;  Surgeon: Vesta Mixer, MD;  Location: [facility] ENDOSCOPY;  Service: Cardiovascular;  Laterality: N/Louis Ford;  . ICD placement in [place]     Prior to Admission medications   Taking no meds PTA.  Says he could not afford cost.     Scheduled Meds:   . aspirin EC  81 mg Oral Daily  . carvedilol  3.125 mg Oral BID WC  . flumazenil  0.5 mg Intravenous Once  . furosemide  80 mg Intravenous BID  . potassium chloride  20 mEq Oral BID  . simvastatin  40 mg Oral q1800  . sodium chloride  3 mL Intravenous Q12H  . warfarin  7.5 mg Oral ONCE-1800  . Warfarin - Pharmacist Dosing Inpatient   Does not apply q1800   Infusions:   . heparin 1,650 Units/hr (09/25/12 2105)   PRN Meds: sodium chloride, acetaminophen, ALPRAZolam,  guaifenesin, ondansetron (ZOFRAN) IV, phenol, sodium chloride   Allergies as of 09/21/2012  . (No Known Allergies)    Family history.  History:  No colon cancer or stomach/intestinal problems.    Social History  . Marital Status: Single, widower.     Spouse Name: N/Louis Ford    Number of Children: N/Louis Ford  . Years of Education: N/Louis Ford   Occupational History  . Retired/went out due to cardiac disability.  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 Smoking status: Former Smoker    Types: Pipe    Quit date: 09/21/2008  . Smokeless  tobacco: Former Neurosurgeon    Quit date: 09/21/2008  . Alcohol Use: No  . Drug Use: No  . Sexually Active: No          Social History Narrative  . He lives in rented home with his son-in-law.  This is the widower of the pt's daughter.     PHYSICAL EXAM: Vital signs in last 24 hours: Temp:  [97.7 F (36.5 C)-99.1 F (37.3 C)] 97.9 F (36.6 C) (10/04 0730) Pulse Rate:  [65-88] 65  (10/04 0730) Resp:  [19-21] 19  (10/04 0730) BP: (102-115)/(57-76) 115/74 mmHg (10/04 0730) SpO2:  [95 %-100 %] 100 % (10/04 0730) Weight:  [224 lb 3.3 oz (101.7 kg)] 224 lb 3.3 oz (101.7 kg) (10/04 0400)  General: Pleasant older AAM who looks moderately unwell Head:  No facial asymmetry or swelling  Eyes:  No conj pallor or icterus.  EOMI Ears:  Not HOH  Nose:  Pt dabbing left nostril repeatedly and small volumes of blood seen on tissue Mouth:  Dental plaque, no oral lesions or gross peridontal erythema.  Throat vis is limited, pt unable to really open mouth wide enough so only very top of uvula visualized.  No erythema, exudate or assymetry noted.  Tongue is mid-line Neck:  No crepitus.  Upper neck tender to palpation Lungs:  Clear in back and front.  Pt constantly clearing throat c/w inablility to swallow secretions effectively.  Heart: RRR.  No MRG.  Defibrillator palpable on right upper chest.  Abdomen:  Soft, NT, mildly obese.  No mass or HSM.  BS active. .   Rectal: deferred   Musc/Skeltl: no gross joint deformity or contractures Extremities:  TEnse right > left sided LE edema, extends at least to knees.  It pits to 3 plus and is associated with dermatitis on both feet (fungal dermatitis)  Neurologic:  Cooperative, not confused, provides good history, seems reliable.  Oriented x 3.  Skin: as per extremity exam Tattoos:  None seen Nodes:  No cervical adenopathy   Psych:  Pleasant, affect subdued.  Not anxious or agitated.   My PE same Iva Boop, MD, [facility]  Intake/Output from previous  day: 10/03 0701 - 10/04 0700 In: 1721.5 [P.O.:1140; I.V.:559.5; IV Piggyback:22] Out: 5500 [Urine:5500] Intake/Output this shift: Total I/O In: 51 [I.V.:43; IV Piggyback:8] Out: -   LAB RESULTS:  Basename 09/26/12 0455 09/25/12 0438 09/24/12 0515  WBC 4.6 3.9* 5.0  HGB 12.7* 12.3* 12.8*  HCT 38.6* 37.3* 38.5*  PLT 171 159 150   BMET Lab Results  Component Value Date   NA 136 09/26/2012   NA 134* 09/25/2012   NA 137 09/24/2012   K 4.3 09/26/2012   K 4.3 09/25/2012   K 4.1 09/24/2012   CL 101 09/26/2012   CL 101 09/25/2012  CL 104 09/24/2012   CO2 26 09/26/2012   CO2 23 09/25/2012   CO2 22 09/24/2012   GLUCOSE 88 09/26/2012   GLUCOSE 92 09/25/2012   GLUCOSE 82 09/24/2012   BUN 30* 09/26/2012   BUN 31* 09/25/2012   BUN 32* 09/24/2012   CREATININE 1.49* 09/26/2012   CREATININE 1.48* 09/25/2012   CREATININE 1.55* 09/24/2012   CALCIUM 9.1 09/26/2012   CALCIUM 9.1 09/25/2012   CALCIUM 8.8 09/24/2012   LFT  Basename 09/26/12 0455  PROT 7.4  ALBUMIN 3.4*  AST 17  ALT 15  ALKPHOS 104  BILITOT 1.1  BILIDIR --  IBILI --   PT/INR Lab Results  Component Value Date   INR 1.55* 09/26/2012   Drugs of Abuse     Component Value Date/Time   LABOPIA NONE DETECTED 09/21/2012 0836   COCAINSCRNUR NONE DETECTED 09/21/2012 0836   LABBENZ NONE DETECTED 09/21/2012 0836   AMPHETMU NONE DETECTED 09/21/2012 0836   THCU NONE DETECTED 09/21/2012 0836   LABBARB NONE DETECTED 09/21/2012 0836     RADIOLOGY STUDIES: No results found.  ENDOSCOPIC STUDIES: None ever.   IMPRESSION: *  Odynophagia.  Post TEE/DCCV.  Rule out post procedure hematoma.  *  Nasal bleeding. Not vigorous but is continuous since last night.  *  Acute on chronic systolic CHF. Diuresis in progress.  Weight down from 236 to 224.   *  Previous ICD placement. *  Atrial Fib.  S/P DCCV.  Maintaining sinus rhythm *  RUE hematoma *  Renal insufficiency  PLAN: *   Pt just went down stairs for [place] CT of the chest to r/o  pneumomediastinum.  This should also help sort out presence of hematoma as I have added neck CT to orders.  *  Will stop Coumadin but leave heparin in place.  He may require and EGD and he is bleeding from nose *  Start prophylactic Protonix once daily.  *  Once neck imaging is obtained can plan for any necessary testing.  *  I regressed diet to clears but ok to use apple sauce for meds.     1450  Addendum CT NECK AND CHEST WITHOUT CONTRAST  Technique: Multidetector CT imaging of the neck and chest was performed using the standard protocol without  intravenous contrast. Comparison: 09/21/2012  CT NECK  Findings: Limited visualization of the intracranial contents does not show any acute finding. There is atherosclerotic  calcification.  There is pronounced prevertebral soft tissue swelling. This is most consistent with inflammatory disease. There is  tonsillar enlargement left more than right that could be due to tonsillitis. There is no extraluminal air / gas. Thyroid gland is normal. Submandibular glands are normal. Parotid glands are normal. No definable lymphadenopathy.  Ordinary cervical spondylosis is present.  IMPRESSION:  Pronounced prevertebral soft tissue swelling most consistent with inflammatory disease. Tonsillar enlargement asymmetrically more prominent on the left, also probably inflammatory. Detail is limited in the absence of contrast administration. I do not see Louis [place] low density collection to suggest frank abscess, but sensitivity is  limited without contrast.  CT CHEST  Findings: There is no evidence of acute mediastinal pathology. No extraluminal gas. There has been previous median sternotomy.  Pacemaker is in place. The heart is enlarged. Chronic pericardial calcification again noted. There is less pleural fluid when  compared to the previous study. Patchy pulmonary opacities in the lower lobes not seen previously could represent developing  pneumonia. No spinal lesion of  significance.  IMPRESSION:  No pneumomediastinum. Less pleural fluid than seen previously. Newly seen patchy density in both lung  bases that could represent developing pneumonia.  Thomasenia Sales, M.D.  Does he need ENT evaluation?  I called Dr Suszanne Conners from ENT and he will see pt.     LOS: 5 days   Jennye Moccasin  09/26/2012, 9:11 AM Pager: [phone]  [facility] Attending  I have also seen and assessed the patient and agree with the above note. CT neck shows prevertebral soft tissue swelling thought to be inflammatory. I believe this is causing his symptoms. ? If trauma from TEE or separate process that has developed. We will call ENT to see the patient.   Iva Boop, MD, [facility] [phone] (pager) 09/26/2012 3:10 PM

## 2012-09-26 NOTE — Progress Notes (Signed)
Pt has been c/o sore throat and "lump" in throat.  Pt has been coughing up clear sputum with some blood streaked sputum.  Pt is coughing forcefully and, "trying to get lump out of throat."  Pt is very anxious and is expressing that he feels he is going to die Orders received for xanax & robitussin.  Pt is also having minimal nose bleeds.  MD & pharmacist aware; no new orders received concerning heparin; will continue to monitor and update as needed; emotional support and relaxation techniques given to pt

## 2012-09-27 DIAGNOSIS — R040 Epistaxis: Secondary | ICD-10-CM

## 2012-09-27 DIAGNOSIS — R9389 Abnormal findings on diagnostic imaging of other specified body structures: Secondary | ICD-10-CM

## 2012-09-27 DIAGNOSIS — R1310 Dysphagia, unspecified: Secondary | ICD-10-CM

## 2012-09-27 LAB — PROTIME-INR
INR: 1.72 — ABNORMAL HIGH (ref 0.00–1.49)
Prothrombin Time: 19.6 seconds — ABNORMAL HIGH (ref 11.6–15.2)

## 2012-09-27 LAB — CBC
Hemoglobin: 12.9 g/dL — ABNORMAL LOW (ref 13.0–17.0)
MCH: 26.4 pg (ref 26.0–34.0)
Platelets: 184 10*3/uL (ref 150–400)
RBC: 4.88 MIL/uL (ref 4.22–5.81)
WBC: 4.3 10*3/uL (ref 4.0–10.5)

## 2012-09-27 LAB — HEPARIN LEVEL (UNFRACTIONATED): Heparin Unfractionated: 0.56 IU/mL (ref 0.30–0.70)

## 2012-09-27 MED ORDER — LOSARTAN POTASSIUM 25 MG PO TABS
25.0000 mg | ORAL_TABLET | Freq: Every day | ORAL | Status: DC
  Administered 2012-09-27 – 2012-09-30 (×4): 25 mg via ORAL
  Filled 2012-09-27 (×5): qty 1

## 2012-09-27 MED ORDER — MORPHINE SULFATE 2 MG/ML IJ SOLN
1.0000 mg | INTRAMUSCULAR | Status: DC | PRN
  Administered 2012-09-27 – 2012-10-03 (×6): 1 mg via INTRAVENOUS
  Filled 2012-09-27 (×6): qty 1

## 2012-09-27 MED ORDER — MORPHINE SULFATE 2 MG/ML IJ SOLN
INTRAMUSCULAR | Status: AC
  Filled 2012-09-27: qty 1

## 2012-09-27 MED ORDER — BIOTENE DRY MOUTH MT LIQD
15.0000 mL | Freq: Two times a day (BID) | OROMUCOSAL | Status: DC
  Administered 2012-09-27 – 2012-10-10 (×24): 15 mL via OROMUCOSAL

## 2012-09-27 MED ORDER — METOLAZONE 2.5 MG PO TABS
2.5000 mg | ORAL_TABLET | Freq: Once | ORAL | Status: AC
  Administered 2012-09-27: 2.5 mg via ORAL
  Filled 2012-09-27: qty 1

## 2012-09-27 NOTE — Progress Notes (Signed)
Pt has been attempting to clear his throat since I came in at 1900 09/26/12. Pt states that he feels like his throat is worse than what it was yesterday after his TEE and it has been really bothering him. Pt still has nose bleeding from his left nostril and he has some wadded up tissue stuck there. When I assessed to see if it actually is draining blood it is not and the pt keeps putting wadded up tissue in his nose reopening scab. Vaseline gauze applied to help with clotting. He has also been attempting to cough or rather clear his throat spitting out minimal to moderate blood. ENT called and orders taken. Will continue to monitor pt.

## 2012-09-27 NOTE — Progress Notes (Addendum)
    Subjective:   Louis Ford is a 66 year old male who has recently relocated from [place] in June 2013, with a medical history of coronary disease status post CABG, systolic HF s/p ICD, hypertension, hyperlipidemia, CHF, and ? sz disorder. Admitted with AF with RVR complicated by HF. Underwent TEE/DC-CV on 10/2  Yesterday developed odynophagia. Neck and chest CT with pronounced prevertebral soft tissue swelling and left tonsillar enlargement. Seen by GI and ENT felt to have possible tonsillitis. No abscess noted. Started on clinda.    Still with mild epistaxis. Throat sore. Hard to swallow.Trying to take liquids. No fevers chills.   Objective:  Vital Signs in the last 24 hours: Temp:  [97.2 F (36.2 C)-98.4 F (36.9 C)] 97.2 F (36.2 C) (10/05 0814) Pulse Rate:  [70-74] 70  (10/05 0334) Resp:  [18-20] 18  (10/05 0820) BP: (95-125)/(57-83) 116/80 mmHg (10/05 0334) SpO2:  [95 %-98 %] 98 % (10/05 0334) Weight:  [96.2 kg (212 lb 1.3 oz)] 96.2 kg (212 lb 1.3 oz) (10/05 0500)  Intake/Output from previous day: 10/04 0701 - 10/05 0700 In: 662 [I.V.:496; IV Piggyback:166] Out: 4320 [Urine:4320]  Physical Exam: Pt is alert and oriented, NAD HEENT: normal except for nasal packing Neck: JVP - 10 carotids 2+= without bruits Lungs: CTA bilaterally CV: RRR without murmur or gallop Abd: soft, NT, Positive BS, no hepatomegaly Ext: 2+ edema improving, distal pulses intact and equal. largu RUE hematoma Skin: warm/dry no rash   Lab Results:  Basename 09/27/12 0500 09/26/12 0455  WBC 4.3 4.6  HGB 12.9* 12.7*  PLT 184 171    Basename 09/26/12 0455 09/25/12 0438  NA 136 134*  K 4.3 4.3  CL 101 101  CO2 26 23  GLUCOSE 88 92  BUN 30* 31*  CREATININE 1.49* 1.48*   No results found for this basename: TROPONINI:2,CK,MB:2 in the last 72 hours  Tele: sinus rhythm, PAC's  Assessment/Plan:  1. Acute/chronic systolic HF, LVEF 15%. 2. AF with RVR s/p DC-CV 3. Tonsillitis, acute 4.  RUE swelling 5. CAD s/p CABG in past 6. Epistaxis 7. Large RUE hematoma 8. Chronic renal failure - baseline Cr ~1.5  Remains in SR. Continues with epistaxis and odynophagia. He has multiple hematomas and given absence of fever and chills I wonder if tonsillar process may be a hematoma due to TEE. Appreciate ENT's evaluation and hopefully they will continue to guide [place]. Will continue heparin with recent DC-CV but continue to hold coumadin. Continue diuresis as he still has volume on board. Start low-dose ARB.   Arvilla Meres, M.D. 09/27/2012, 10:44 AM

## 2012-09-27 NOTE — Progress Notes (Signed)
[facility] Daily Rounding Note 09/27/2012, 8:23 AM  SUBJECTIVE:       Still  With odynophagia but able to tolerate clears if he takes them slowly  OBJECTIVE:         Vital signs in last 24 hours:    Temp:  [97.2 F (36.2 C)-98.4 F (36.9 C)] 97.2 F (36.2 C) (10/05 0814) Pulse Rate:  [70-74] 70  (10/05 0334) Resp:  [18-20] 18  (10/05 0820) BP: (95-125)/(57-83) 116/80 mmHg (10/05 0334) SpO2:  [95 %-98 %] 98 % (10/05 0334) Weight:  [212 lb 1.3 oz (96.2 kg)] 212 lb 1.3 oz (96.2 kg) (10/05 0500) Last BM Date: 09/25/12 General: looks well   Heart: RRR Neck:  Tender at left . Right upper neck  Chest: clear B Abdomen: soft, NT, ND.  Active BS  Extremities: LE edema. Prominent bumping out of hematoma at site of flushot at upper right arm and at antecubitum on right. Neuro/Psych:  Pleasant, not confused, oriented x 3.    Intake/Output from previous day: 2023/10/10 0701 - 10/05 0700 In: 557 [I.V.:491; IV Piggyback:66] Out: 4320 [Urine:4320]  Intake/Output this shift: Total I/O In: 8 [IV Piggyback:8] Out: -   Lab Results:  Basename 09/27/12 0500 2012/10/09 0455 09/25/12 0438  WBC 4.3 4.6 3.9*  HGB 12.9* 12.7* 12.3*  HCT 39.4 38.6* 37.3*  PLT 184 171 159   BMET  Basename 2012/10/09 0455 09/25/12 0438  NA 136 134*  K 4.3 4.3  CL 101 101  CO2 26 23  GLUCOSE 88 92  BUN 30* 31*  CREATININE 1.49* 1.48*  CALCIUM 9.1 9.1   LFT  Basename October 09, 2012 0455  PROT 7.4  ALBUMIN 3.4*  AST 17  ALT 15  ALKPHOS 104  BILITOT 1.1  BILIDIR --  IBILI --   PT/INR  Basename 09/27/12 0500 Oct 09, 2012 0455  LABPROT 19.6* 18.1*  INR 1.72* 1.55*   Studies/Results: Ct Soft Tissue Neck Wo Contrast CT chest wo contrast 10/09/12  IMPRESSION: Pronounced prevertebral soft tissue swelling most consistent with inflammatory disease.  Tonsillar enlargement asymmetrically more prominent on the left, also probably inflammatory.  Detail is limited in the absence of contrast administration.  I do not  see a low density collection to suggest frank abscess, but sensitivity is limited without contrast.  IMPRESSION: No pneumomediastinum.  Less pleural fluid than seen previously.  Newly seen patchy density in both lung bases that could represent developing pneumonia.   Original Report Authenticated By: Thomasenia Sales, M.D.    ASSESMENT: *  Odyno/Dysphagia post TEE/DCCV.  Per ENT Dr Suszanne Conners:  Edema and erythema of left tonsil.  Started on Clindamycin and got one dose of Decadron.  WBCs were never elevated. Given the vigorous hematoma on right arm  I still wonder if the edema/erythema is from a tonsillar hematoma *  Nasal bleeding, small volume.  No associated drop in H & H. Also has large, prominent hematomas on right arm *  A fib with RVR, TEE/DCCV on 10/2 *  Anticoagulation with Heparin, Coumadin stopped yesterday (never reacehed therapeutic level) *  Acute on chronic systolic CHF   PLAN: *  Per ENT.  Will sign off.    LOS: 6 days   Jennye Moccasin  09/27/2012, 8:23 AM Pager: [phone]    GI Attending  I have also seen and assessed the patient and agree with the above note. I would agree with Dr. Gala Romney that hematoma from TEE more likely than tonsilitis or infection.  Baldo Ash  Sena Slate, MD, [facility] [phone] (pager) 09/27/2012 2:03 PM

## 2012-09-27 NOTE — Progress Notes (Signed)
ANTICOAGULATION CONSULT NOTE - Follow Up Consult  Pharmacy Consult for Heparin Indication: atrial fibrillation  No Known Allergies  Patient Measurements: Heparin Dosing Weight: 98kg  Vital Signs: Temp: 97.2 F (36.2 C) (10/05 0814) Temp src: Oral (10/05 0814) BP: 116/80 mmHg (10/05 0334) Pulse Rate: 70  (10/05 0334)  Labs:  Basename 09/27/12 0500 09/26/12 0455 09/25/12 1200 09/25/12 0438  HGB 12.9* 12.7* -- --  HCT 39.4 38.6* -- 37.3*  PLT 184 171 -- 159  APTT -- -- -- --  LABPROT 19.6* 18.1* -- 18.2*  INR 1.72* 1.55* -- 1.56*  HEPARINUNFRC 0.56 0.56 0.33 --  CREATININE -- 1.49* -- 1.48*  CKTOTAL -- -- -- --  CKMB -- -- -- --  TROPONINI -- -- -- --    Estimated Creatinine Clearance: 60.1 ml/min (by C-G formula based on Cr of 1.49).   Medications:  Heparin @ 1550 units/hr  Assessment: 65yom continues on heparin with a therapeutic heparin level after rate decrease yesterday. Hgb is stabilizing, platelets are stable.. Will continue at current rate. INR is 1.7. Warfarin stopped yesterday due to coughing up blood, ENT/GI was consulted possibly tonsilitis vs tonsillar hematoma, he is s/p TEE. No drainable abscess per ENT. Patient started on clindamycin. Will continue heparin and follow up possibly adding back warfarin.  Goal of Therapy:  Heparin level 0.3-0.7 Monitor platelets by anticoagulation protocol: Yes   Plan:  1) Continue heparin at 1550 units/hr 2) Follow up heparin level/INR in AM   Louis Ford 09/27/2012,9:45 AM

## 2012-09-27 NOTE — Progress Notes (Signed)
cCARDIAC REHAB PHASE I   PRE:  Rate/Rhythm: 81  BP:  Supine: 128/80  Sitting:   Standing:    SaO2: 98  MODE:  Ambulation: 210 ft   POST:  Rate/Rhythem: 105  BP:  Supine: 120/80  Sitting:   Standing:    SaO2: 100 Ambulated with assist x 1 using rolling walker.  Gait is unsteady without walker.  Rest x 2.  Returned to room, to BR, monitor shows sinus rhythm with freq PAC and PVCs.   Patient asymptomatic, in bathroom, Mini-bath.  Returned to bed per patient request.  Call bell within reach.    Jackey Loge

## 2012-09-27 NOTE — Progress Notes (Signed)
Subjective: Pt resting comfortably in bed.  No stridor.   Objective: Vital signs in last 24 hours: Temp:  [97.2 F (36.2 C)-98.4 F (36.9 C)] 97.2 F (36.2 C) (10/05 0814) Pulse Rate:  [70-74] 70  (10/05 0334) Resp:  [18-20] 18  (10/05 0820) BP: (95-125)/(57-83) 116/80 mmHg (10/05 0334) SpO2:  [95 %-98 %] 98 % (10/05 0334) Weight:  [96.2 kg (212 lb 1.3 oz)] 96.2 kg (212 lb 1.3 oz) (10/05 0500)  Physical Exam: General: Pleasant male in NAD. A&O x 3. Anxious.  Head: No facial asymmetry or swelling  Eyes: No conj pallor or icterus. His pupils are equal, round, reactive to light. Extraocular motion is intact.  ENT: Examination of the ears shows normal auricles and external auditory canals bilaterally. Nasal examination shows congested mucosa, septum, turbinates. Facial examination shows no asymmetry. Palpation of the face elicit no significant tenderness. Oral cavity examination shows edema and erythema or the left tonsils. Palpation of the neck reveals no lymphadenopathy or mass. The trachea is midline. The thyroid is not significantly enlarged. Cranial nerves 2-12 are all grossly intact.   Basename 09/27/12 0500 09/26/12 0455  WBC 4.3 4.6  HGB 12.9* 12.7*  HCT 39.4 38.6*  PLT 184 171    Basename 09/26/12 0455 09/25/12 0438  NA 136 134*  K 4.3 4.3  CL 101 101  CO2 26 23  GLUCOSE 88 92  BUN 30* 31*  CREATININE 1.49* 1.48*  CALCIUM 9.1 9.1    Medications:  I have reviewed the patient's current medications. Scheduled:   . antiseptic oral rinse  15 mL Mouth Rinse BID  . aspirin EC  81 mg Oral Daily  . carvedilol  3.125 mg Oral BID WC  . clindamycin (CLEOCIN) IV  600 mg Intravenous Q8H  . dexamethasone (DECADRON) IVPB  20 mg Intravenous Once  . flumazenil  0.5 mg Intravenous Once  . furosemide  80 mg Intravenous BID  . morphine      . potassium chloride  20 mEq Oral BID  . simvastatin  40 mg Oral q1800  . sodium chloride  3 mL Intravenous Q12H  . DISCONTD: warfarin  10  mg Oral ONCE-1800  . DISCONTD: Warfarin - Pharmacist Dosing Inpatient   Does not apply q1800   GUY:QIHKVQ chloride, acetaminophen, ALPRAZolam, guaiFENesin, morphine injection, ondansetron (ZOFRAN) IV, phenol, sodium chloride  Assessment/Plan: Edematous and erythematous left tonsils/pharynx, likely an acute infectious/inflammatory process (acute tonsillitis or peritonsillar cellulitis). No drainable abscess. Continue IV clindamycin.  Will follow.    LOS: 6 days   Yuval Nolet,SUI W 09/27/2012, 8:56 AM

## 2012-09-28 ENCOUNTER — Inpatient Hospital Stay (HOSPITAL_COMMUNITY): Payer: PRIVATE HEALTH INSURANCE

## 2012-09-28 DIAGNOSIS — R040 Epistaxis: Secondary | ICD-10-CM

## 2012-09-28 LAB — BASIC METABOLIC PANEL
Chloride: 95 mEq/L — ABNORMAL LOW (ref 96–112)
GFR calc non Af Amer: 51 mL/min — ABNORMAL LOW (ref >90)
Glucose, Bld: 90 mg/dL (ref 70–99)
Potassium: 4.2 mEq/L (ref 3.5–5.1)
Sodium: 138 mEq/L (ref 135–145)

## 2012-09-28 LAB — CBC
Hemoglobin: 13.5 g/dL (ref 13.0–17.0)
MCHC: 33.9 g/dL (ref 30.0–36.0)
RBC: 4.94 MIL/uL (ref 4.22–5.81)
WBC: 11 10*3/uL — ABNORMAL HIGH (ref 4.0–10.5)

## 2012-09-28 LAB — PROTIME-INR: INR: 1.87 — ABNORMAL HIGH (ref 0.00–1.49)

## 2012-09-28 MED ORDER — SODIUM CHLORIDE 0.9 % IJ SOLN
10.0000 mL | Freq: Two times a day (BID) | INTRAMUSCULAR | Status: DC
  Administered 2012-09-28: 20 mL
  Administered 2012-09-29 – 2012-10-01 (×4): 10 mL
  Administered 2012-10-02: 20 mL
  Administered 2012-10-02 – 2012-10-04 (×4): 10 mL
  Administered 2012-10-04 – 2012-10-06 (×4): 20 mL
  Administered 2012-10-06 – 2012-10-07 (×2): 10 mL

## 2012-09-28 MED ORDER — SODIUM CHLORIDE 0.9 % IJ SOLN
10.0000 mL | INTRAMUSCULAR | Status: DC | PRN
  Administered 2012-10-05 (×2): 10 mL
  Administered 2012-10-06: 20 mL
  Administered 2012-10-07 – 2012-10-10 (×7): 10 mL

## 2012-09-28 MED ORDER — ENOXAPARIN SODIUM 100 MG/ML ~~LOC~~ SOLN
1.0000 mg/kg | Freq: Two times a day (BID) | SUBCUTANEOUS | Status: DC
  Administered 2012-09-28 – 2012-09-30 (×5): 90 mg via SUBCUTANEOUS
  Filled 2012-09-28 (×8): qty 1

## 2012-09-28 MED ORDER — WARFARIN SODIUM 5 MG PO TABS
5.0000 mg | ORAL_TABLET | Freq: Once | ORAL | Status: DC
  Filled 2012-09-28: qty 1

## 2012-09-28 MED ORDER — ENOXAPARIN SODIUM 100 MG/ML ~~LOC~~ SOLN
1.0000 mg/kg | Freq: Two times a day (BID) | SUBCUTANEOUS | Status: DC
  Filled 2012-09-28: qty 1

## 2012-09-28 MED ORDER — ENOXAPARIN SODIUM 100 MG/ML ~~LOC~~ SOLN
1.0000 mg/kg | Freq: Once | SUBCUTANEOUS | Status: DC
  Filled 2012-09-28: qty 1

## 2012-09-28 MED ORDER — WARFARIN - PHARMACIST DOSING INPATIENT: Freq: Every day | Status: DC

## 2012-09-28 MED ORDER — WARFARIN SODIUM 2.5 MG PO TABS
2.5000 mg | ORAL_TABLET | Freq: Once | ORAL | Status: AC
  Administered 2012-09-28: 2.5 mg via ORAL
  Filled 2012-09-28: qty 1

## 2012-09-28 NOTE — Progress Notes (Signed)
Peripherally Inserted Central Catheter/Midline Placement  The IV Nurse has discussed with the patient and/or persons authorized to consent for the patient, the purpose of this procedure and the potential benefits and risks involved with this procedure.  The benefits include less needle sticks, lab draws from the catheter and patient may be discharged home with the catheter.  Risks include, but not limited to, infection, bleeding, blood clot (thrombus formation), and puncture of an artery; nerve damage and irregular heat beat.  Alternatives to this procedure were also discussed.  PICC/Midline Placement Documentation        Louis Ford 09/28/2012, 3:02 PM

## 2012-09-28 NOTE — Progress Notes (Addendum)
    Subjective:   Louis Ford is a 66 year old male who has recently relocated from [place] in June 2013, with a medical history of coronary disease status post CABG, systolic HF s/p ICD, hypertension, hyperlipidemia, CHF, and ? sz disorder. Admitted with AF with RVR complicated by HF. Underwent TEE/DC-CV on 10/2  Developed odynophagia on 10/4 - 2 days after TEE. Neck and chest CT with pronounced prevertebral soft tissue swelling and left tonsillar enlargement. Seen by GI and ENT felt to have possible tonsillitis. No abscess noted. Started on clinda.    I started metolazone yesterday to facilitate diuresis. Weight down 9 pounds.   Still with mild epistaxis. Throat sore but he feels it is improving. Taking liquids but sometimes spits them back up. No fevers chills.No orthopnea/PND.    Objective:  Vital Signs in the last 24 hours: Temp:  [97.3 F (36.3 C)-97.9 F (36.6 C)] 97.5 F (36.4 C) (10/06 0808) Pulse Rate:  [67-82] 69  (10/06 0334) Resp:  [16-20] 16  (10/06 0808) BP: (99-142)/(64-93) 112/64 mmHg (10/06 0334) SpO2:  [94 %-100 %] 98 % (10/06 0334) Weight:  [92.2 kg (203 lb 4.2 oz)] 92.2 kg (203 lb 4.2 oz) (10/06 0700)  Intake/Output from previous day: 10/05 0701 - 10/06 0700 In: 1058 [P.O.:400; I.V.:492; IV Piggyback:166] Out: 3800 [Urine:3800]  Physical Exam: Pt is alert and oriented, NAD HEENT: normal except for nasal packing Neck: JVP - 10 carotids 2+= without bruits Lungs: CTA bilaterally CV: RRR without murmur or gallop Abd: soft, NT, Positive BS, no hepatomegaly Ext: 1-2+ edema improving, distal pulses intact and equal. large RUE hematoma Skin: warm/dry no rash   Lab Results:  Basename 09/27/12 0500 09/26/12 0455  WBC 4.3 4.6  HGB 12.9* 12.7*  PLT 184 171    Basename 09/26/12 0455  NA 136  K 4.3  CL 101  CO2 26  GLUCOSE 88  BUN 30*  CREATININE 1.49*   No results found for this basename: TROPONINI:2,CK,MB:2 in the last 72 hours  Tele: sinus  rhythm, PAC's  Assessment/Plan:  1. Acute/chronic systolic HF, LVEF 15%. 2. AF with RVR s/p DC-CV 10/2 3. Tonsillitis, acute 4. CAD s/p CABG in past 5. Epistaxis 6. Large RUE hematoma 7. Chronic renal failure - baseline Cr ~1.5  Remains in SR. Continues with epistaxis and odynophagia but seems to be improving. He has multiple hematomas and given absence of fever and chills I wonder if tonsillar process may be a hematoma due to TEE. Appreciate ENT's evaluation and hopefully they will continue to guide [place]. May need formal packing or cauterization to slow epistaxis. HgB stable.   Will continue heparin with recent DC-CV. Coumadin has been on hold. Will restart coumadin today cautiously. Once INR >=2.0 will stop heparin.  Continue to diurese as he still has fluid on board. BMET pending.   If lab continue to have problems drawing blood may need PICC.   Louis Ford, M.D. 09/28/2012, 10:23 AM

## 2012-09-28 NOTE — Progress Notes (Signed)
Subjective: Pt resting comfortably in bed. No stridor.  Tolerated some liquid po yesterday.  Objective: Vital signs in last 24 hours: Temp:  [97.3 F (36.3 C)-97.9 F (36.6 C)] 97.7 F (36.5 C) (10/06 1136) Pulse Rate:  [67-82] 71  (10/06 1137) Resp:  [16-20] 18  (10/06 1137) BP: (99-142)/(64-93) 108/68 mmHg (10/06 0808) SpO2:  [94 %-100 %] 99 % (10/06 0808) Weight:  [92.2 kg (203 lb 4.2 oz)] 92.2 kg (203 lb 4.2 oz) (10/06 0700)  Physical Exam:  General: Pleasant male in NAD. A&O x 3. Anxious.  Head: No facial asymmetry or swelling  Eyes: No conj pallor or icterus. His pupils are equal, round, reactive to light. Extraocular motion is intact.  ENT: Examination of the ears shows normal auricles and external auditory canals bilaterally. Nasal examination shows congested mucosa, septum, turbinates. No acute bleeding. Facial examination shows no asymmetry. Palpation of the face elicit no significant tenderness. Oral cavity examination shows mod edema and erythema or the left tonsils. Palpation of the neck reveals no lymphadenopathy or mass. The trachea is midline. The thyroid is not significantly enlarged. Cranial nerves 2-12 are all grossly intact.   Basename 09/27/12 0500 09/26/12 0455  WBC 4.3 4.6  HGB 12.9* 12.7*  HCT 39.4 38.6*  PLT 184 171    Basename 09/26/12 0455  NA 136  K 4.3  CL 101  CO2 26  GLUCOSE 88  BUN 30*  CREATININE 1.49*  CALCIUM 9.1    Medications:  I have reviewed the patient's current medications. Scheduled:   . antiseptic oral rinse  15 mL Mouth Rinse BID  . aspirin EC  81 mg Oral Daily  . carvedilol  3.125 mg Oral BID WC  . clindamycin (CLEOCIN) IV  600 mg Intravenous Q8H  . enoxaparin (LOVENOX) injection  1 mg/kg Subcutaneous Once  . enoxaparin (LOVENOX) injection  1 mg/kg Subcutaneous BID  . flumazenil  0.5 mg Intravenous Once  . furosemide  80 mg Intravenous BID  . losartan  25 mg Oral Daily  . metolazone  2.5 mg Oral Once  . potassium  chloride  20 mEq Oral BID  . simvastatin  40 mg Oral q1800  . sodium chloride  3 mL Intravenous Q12H   BJY:NWGNFA chloride, acetaminophen, ALPRAZolam, guaiFENesin, morphine injection, ondansetron (ZOFRAN) IV, phenol, sodium chloride  Assessment/Plan: The patient's left tonsillitis and peritonsillar cellulitis have improved.  - Continue IV clindamycin. - Advance diet as tolerated. - Will follow.   LOS: 7 days   Louis Ford,SUI W 09/28/2012, 12:09 PM

## 2012-09-28 NOTE — Progress Notes (Signed)
ANTICOAGULATION CONSULT NOTE - Follow Up Consult  Pharmacy Consult for Heparin>>lovenox>>warfarin Indication: atrial fibrillation  No Known Allergies  Patient Measurements: Heparin Dosing Weight: 98kg  Vital Signs: Temp: 98.1 F (36.7 C) (10/06 1554) Temp src: Oral (10/06 1554) BP: 95/58 mmHg (10/06 1137) Pulse Rate: 71  (10/06 1137)  Labs:  Basename 09/28/12 1520 09/27/12 0500 09/26/12 0455  HGB -- 12.9* 12.7*  HCT -- 39.4 38.6*  PLT -- 184 171  APTT -- -- --  LABPROT 20.8* 19.6* 18.1*  INR 1.87* 1.72* 1.55*  HEPARINUNFRC -- 0.56 0.56  CREATININE -- -- 1.49*  CKTOTAL -- -- --  CKMB -- -- --  TROPONINI -- -- --    Estimated Creatinine Clearance: 58.9 ml/min (by C-G formula based on Cr of 1.49).   Medications:  Heparin @ 1550 units/hr  Assessment: 65yom continues on heparin with a therapeutic heparin level after rate decrease yesterday. Hgb is stabilizing, platelets are stable.. Will continue at current rate. INR is 1.7. Warfarin stopped yesterday due to coughing up blood, ENT/GI was consulted possibly tonsilitis vs tonsillar hematoma, he is s/p TEE. No drainable abscess per ENT. Patient started on clindamycin.   Tonsillitis improved, still with some mild epistaxis this morning. Due to no IV access a PICC has been placed and therapy has been changed from heparin to lovenox to decrease blood draws. Warfarin will also be resume. It appears that he only received one dose (7.5mg ) of coumadin and his INR has remained elevated, currently 1.87. Will start with conservative dose tonight.  Goal of Therapy:  Heparin level 0.3-0.7 INR goal 2-3 Monitor platelets by anticoagulation protocol: Yes   Plan:  1) Change to lovenox 90mg  q 12h 2) Warfarin 2.5mg  tonight 3) INR in am   Severiano Gilbert 09/28/2012,3:57 PM

## 2012-09-29 ENCOUNTER — Encounter (HOSPITAL_COMMUNITY): Payer: Self-pay | Admitting: Internal Medicine

## 2012-09-29 DIAGNOSIS — I255 Ischemic cardiomyopathy: Secondary | ICD-10-CM

## 2012-09-29 DIAGNOSIS — IMO0002 Reserved for concepts with insufficient information to code with codable children: Secondary | ICD-10-CM

## 2012-09-29 DIAGNOSIS — Z95810 Presence of automatic (implantable) cardiac defibrillator: Secondary | ICD-10-CM | POA: Insufficient documentation

## 2012-09-29 DIAGNOSIS — R9430 Abnormal result of cardiovascular function study, unspecified: Secondary | ICD-10-CM

## 2012-09-29 LAB — BASIC METABOLIC PANEL
BUN: 45 mg/dL — ABNORMAL HIGH (ref 6–23)
Chloride: 93 mEq/L — ABNORMAL LOW (ref 96–112)
GFR calc Af Amer: 52 mL/min — ABNORMAL LOW (ref >90)
Potassium: 3.9 mEq/L (ref 3.5–5.1)

## 2012-09-29 LAB — CBC
HCT: 38.8 % — ABNORMAL LOW (ref 39.0–52.0)
Platelets: 179 10*3/uL (ref 150–400)
RBC: 4.82 MIL/uL (ref 4.22–5.81)
RDW: 16.7 % — ABNORMAL HIGH (ref 11.5–15.5)
WBC: 7.9 10*3/uL (ref 4.0–10.5)

## 2012-09-29 LAB — PROTIME-INR: Prothrombin Time: 21.4 seconds — ABNORMAL HIGH (ref 11.6–15.2)

## 2012-09-29 MED ORDER — WARFARIN SODIUM 2.5 MG PO TABS
2.5000 mg | ORAL_TABLET | Freq: Once | ORAL | Status: AC
  Administered 2012-09-29: 2.5 mg via ORAL
  Filled 2012-09-29: qty 1

## 2012-09-29 MED ORDER — WARFARIN SODIUM 2.5 MG PO TABS
2.5000 mg | ORAL_TABLET | Freq: Once | ORAL | Status: DC
  Filled 2012-09-29: qty 1

## 2012-09-29 NOTE — Progress Notes (Signed)
Physical Therapy Treatment Patient Details Name: Louis Ford MRN: [id number] DOB: 07-Mar-1946 Today's Date: 09/29/2012 Time: 1040-1100 PT Time Calculation (min): 20 min  PT Assessment / Plan / Recommendation Comments on Treatment Session  Pt progressing with PT goals.  Cont to encourage ambulation with Nsing.      Follow Up Recommendations  Home health PT;Supervision/Assistance - 24 hour     Does the patient have the potential to tolerate intense rehabilitation     Barriers to Discharge        Equipment Recommendations  Rolling walker with 5" wheels    Recommendations for Other Services    Frequency Min 3X/week   Plan Discharge plan remains appropriate;Frequency remains appropriate    Precautions / Restrictions Precautions Precautions: Fall Restrictions Weight Bearing Restrictions: No    Pertinent Vitals/Pain C/o pain in R UE & shoulders.      Mobility  Bed Mobility Bed Mobility: Supine to Sit;Sitting - Scoot to Edge of Bed Supine to Sit: 6: Modified independent (Device/Increase time);HOB elevated Sitting - Scoot to Edge of Bed: 6: Modified independent (Device/Increase time) Transfers Transfers: Sit to Stand;Stand to Sit Sit to Stand: 5: Supervision;With upper extremity assist;With armrests;From bed;From chair/3-in-1 Stand to Sit: 5: Supervision;With upper extremity assist;With armrests;To chair/3-in-1 Details for Transfer Assistance: Supervision for safety Ambulation/Gait Ambulation/Gait Assistance: 4: Min guard Ambulation Distance (Feet): 300 Feet Assistive device: Other (Comment) (pushed IV pole) Ambulation/Gait Assistance Details: Cues for tall posture.  Trialed with unilateral UE support vs bil UE support.  Pt states he prefers bil UE support at this time but still deferring use of RW.   Gait Pattern: Step-through pattern;Decreased stride length Gait velocity: decr Stairs: No Wheelchair Mobility Wheelchair Mobility: No      PT Goals Acute Rehab PT  Goals Time For Goal Achievement: 09/30/12 Potential to Achieve Goals: Good Pt will go Sit to Stand: Independently PT Goal: Sit to Stand - Progress: Progressing toward goal Pt will Ambulate: 51 - 150 feet;with modified independence;with least restrictive assistive device PT Goal: Ambulate - Progress: Progressing toward goal Pt will Go Up / Down Stairs: 1-2 stairs;with supervision;with least restrictive assistive device  Visit Information  Last PT Received On: 09/29/12 Assistance Needed: +1    Subjective Data  Subjective: "Just give me 10 minutes" Patient Stated Goal: To go home   Cognition  Overall Cognitive Status: Appears within functional limits for tasks assessed/performed Arousal/Alertness: Awake/alert Orientation Level: Appears intact for tasks assessed Behavior During Session: [facility] for tasks performed    Balance     End of Session PT - End of Session Equipment Utilized During Treatment: Gait belt Activity Tolerance: Patient tolerated treatment well Patient left: in chair;with call bell/phone within reach Nurse Communication: Mobility status    Verdell Face, [place] [phone] 09/29/2012

## 2012-09-29 NOTE — Consult Note (Signed)
[facility] [place] CONSULT NOTE 09/29/2012 DOB: [id number] MRN : [id number]  CC: Right AC hematoma with intermittent numbness right hand Referring Physician: Duke Salvia, MD  History of Present Illness: Louis Ford is a 66 y.o. male  has a history of CAD (s/p CABG at [facility] hospital in [place] Family member reprorts this was greater than 10 years ago.) He is also s/p AICD placement [facility]) Had change out done at [employer]. He has been on Heparin and coumadin  for AFib RVR. Pt was noted to have large painful hematoma in right AC space migrating to upper and lower arm. Pt states he has had intermittent numbness in the hand but states he can use the arm normally. He denies pain in the forearm or hand. He is unsure if this has gotten bigger. We were asked to evaluate for poss compartment syndrome. Multiple sticks to antecubital area. Difficult to determine if he had any procedures through brachial artery.    Past Medical History  Diagnosis Date  . CAD (coronary artery disease)   . Chronic systolic heart failure   . Ischemic cardiomyopathy     EF 15 to 20% by TTE and TEE in 09/2012.  Severe LV dysfunction  . HTN (hypertension)   . Hyperlipidemia   . Obesity     BMI 31 in 09/2012  . Atrial fibrillation   09/22/2012  . implantable cardiac defibrillator-Biotronik     Device Implanted 2006 Reimplanted 2012 : Explanted  and Reimplanted  7/12  . Stroke     Past Surgical History  Procedure Date  . Coronary artery bypass graft   . Tee without cardioversion 09/24/2012    Procedure: TRANSESOPHAGEAL ECHOCARDIOGRAM (TEE);  Surgeon: Vesta Mixer, MD;  Location: [facility] ENDOSCOPY;  Service: Cardiovascular;  Laterality: N/A;  dave/anesth, dl, cindy/echo   . Cardioversion 09/24/2012    Procedure: CARDIOVERSION;  Surgeon: Vesta Mixer, MD;  Location: [facility] ENDOSCOPY;  Service: Cardiovascular;  Laterality: N/A;  . Icd      ROS: [x]  Positive  [ ]  Denies     General: [ ]  Weight loss, [ ]  Fever, [ ]  chills Neurologic: [ ]  Dizziness, [ ]  Blackouts, [ ]  Seizure [ ]  Stroke, [ ]  "Mini stroke", [ ]  Slurred speech, [ ]  Temporary blindness; [ ]  weakness in arms or legs, [ ]  Hoarseness Cardiac: [x ] Chest pain/pressure, [ ]  Shortness of breath at rest [ ]  Shortness of breath with exertion, [x ] Atrial fibrillation or irregular heartbeat Vascular: [ ]  Pain in legs with walking, [ ]  Pain in legs at rest, [ ]  Pain in legs at night,  [ ]  Non-healing ulcer, [ ]  Blood clot in vein/DVT,   Pulmonary: [ ]  Home oxygen, [ ]  Productive cough, [ ]  Coughing up blood, [ ]  Asthma,  [ ]  Wheezing Musculoskeletal:  [ ]  Arthritis, [ ]  Low back pain, [ ]  Joint pain Hematologic: [x ] Easy Bruising, [ ]  Anemia; [ ]  Hepatitis Gastrointestinal: [ ]  Blood in stool, [ ]  Gastroesophageal Reflux/heartburn, [ ]  Trouble swallowing Urinary: [ ]  chronic Kidney disease, [ ]  on HD - [ ]  MWF or [ ]  TTHS, [ ]  Burning with urination, [ ]  Difficulty urinating Skin: [ ]  Rashes, [ ]  Wounds Psychological: [ ]  Anxiety, [ ]  Depression  Social History History  Substance Use Topics  . Smoking status: Former Smoker    Types: Pipe    Quit date: 09/21/2008  . Smokeless tobacco: Former Neurosurgeon  Quit date: 09/21/2008  . Alcohol Use: No    Family History History reviewed. No pertinent family history.  No Known Allergies  Current Facility-Administered Medications  Medication Dose Route Frequency Provider Last Rate Last Dose  . 0.9 %  sodium chloride infusion  250 mL Intravenous PRN Ripudeep K Rai, MD 5 mL/hr at 09/27/12 0700 250 mL at 09/27/12 0700  . acetaminophen (TYLENOL) tablet 650 mg  650 mg Oral Q4H PRN Ripudeep Jenna Luo, MD   650 mg at 09/26/12 2102  . ALPRAZolam [name]) tablet 0.5 mg  0.5 mg Oral BID PRN Laurey Morale, MD   0.5 mg at 09/26/12 0949  . antiseptic oral rinse (BIOTENE) solution 15 mL  15 mL Mouth Rinse BID Pricilla Riffle, MD   15 mL at 09/29/12 0800  . aspirin EC tablet 81  mg  81 mg Oral Daily Ripudeep K Rai, MD   81 mg at 09/29/12 1017  . carvedilol (COREG) tablet 3.125 mg  3.125 mg Oral BID WC Tonny Bollman, MD   3.125 mg at 09/29/12 0848  . clindamycin (CLEOCIN) IVPB 600 mg  600 mg Intravenous Q8H Darletta Moll, MD   600 mg at 09/29/12 1017  . enoxaparin (LOVENOX) injection 90 mg  1 mg/kg Subcutaneous Q12H Severiano Gilbert, PHARMD   90 mg at 09/29/12 0551  . flumazenil (ROMAZICON) injection 0.5 mg  0.5 mg Intravenous Once Kipp Brood, MD      . furosemide (LASIX) injection 80 mg  80 mg Intravenous BID Tonny Bollman, MD   80 mg at 09/29/12 0849  . guaiFENesin (ROBITUSSIN) 100 MG/5ML solution 200 mg  200 mg Oral Q4H PRN Laurey Morale, MD   200 mg at 09/26/12 2102  . losartan (COZAAR) tablet 25 mg  25 mg Oral Daily Dolores Patty, MD   25 mg at 09/28/12 0958  . morphine 2 MG/ML injection 1 mg  1 mg Intravenous Q1H PRN Darletta Moll, MD   1 mg at 09/27/12 1619  . ondansetron (ZOFRAN) injection 4 mg  4 mg Intravenous Q6H PRN Ripudeep Jenna Luo, MD   4 mg at 09/25/12 0951  . phenol (CHLORASEPTIC) mouth spray 1 spray  1 spray Mouth/Throat PRN Jessica A Hope, PA-C      . potassium chloride 20 MEQ/15ML (10%) liquid 20 mEq  20 mEq Oral BID Pricilla Riffle, MD   20 mEq at 09/29/12 1018  . simvastatin (ZOCOR) tablet 40 mg  40 mg Oral q1800 Ripudeep Jenna Luo, MD   40 mg at 09/28/12 1809  . sodium chloride 0.9 % injection 10-40 mL  10-40 mL Intracatheter Q12H Dolores Patty, MD   20 mL at 09/28/12 2136  . sodium chloride 0.9 % injection 10-40 mL  10-40 mL Intracatheter PRN Dolores Patty, MD      . sodium chloride 0.9 % injection 3 mL  3 mL Intravenous Q12H Ripudeep Jenna Luo, MD   3 mL at 09/28/12 2136  . sodium chloride 0.9 % injection 3 mL  3 mL Intravenous PRN Ripudeep Jenna Luo, MD   3 mL at 09/28/12 0752  . warfarin (COUMADIN) tablet 2.5 mg  2.5 mg Oral ONCE-1800 Severiano Gilbert, PHARMD   2.5 mg at 09/28/12 1810  . warfarin (COUMADIN) tablet 2.5 mg  2.5 mg Oral ONCE-1800  Drake Leach Rumbarger, PHARMD      . Warfarin - Pharmacist Dosing Inpatient   Does not apply q1800 Severiano Gilbert, PHARMD      .  DISCONTD: enoxaparin (LOVENOX) injection 90 mg  1 mg/kg Subcutaneous Once Severiano Gilbert, [facility]      . DISCONTD: enoxaparin (LOVENOX) injection 90 mg  1 mg/kg Subcutaneous BID Severiano Gilbert, PHARMD      . DISCONTD: warfarin (COUMADIN) tablet 2.5 mg  2.5 mg Oral ONCE-1800 Drake Leach Rumbarger, PHARMD      . DISCONTD: warfarin (COUMADIN) tablet 5 mg  5 mg Oral ONCE-1800 Severiano Gilbert, [place]         Imaging: Dg Chest Port 1 View  09/28/2012  *RADIOLOGY REPORT*  Clinical Data: Confirm line placement.  PORTABLE CHEST - 1 VIEW  Comparison: 09/21/2012  Findings: Right-sided transvenous pacemaker lead overlies the right ventricle.  Patient has had median sternotomy and CABG.  A left PICC line tip overlies the level of the superior vena cava.  The heart is enlarged.  There is minimal bibasilar atelectasis.  No evidence for edema.  No focal consolidations or pleural effusions.  IMPRESSION: Interval placement of left-sided PICC line, tip to the superior vena cava. Cardiomegaly.   Original Report Authenticated By: Patterson Hammersmith, M.D.     Significant Diagnostic Studies: CBC Lab Results  Component Value Date   WBC 7.9 09/29/2012   HGB 12.7* 09/29/2012   HCT 38.8* 09/29/2012   MCV 80.5 09/29/2012   PLT 179 09/29/2012    BMET    Component Value Date/Time   NA 135 09/29/2012 0436   K 3.9 09/29/2012 0436   CL 93* 09/29/2012 0436   CO2 34* 09/29/2012 0436   GLUCOSE 95 09/29/2012 0436   BUN 45* 09/29/2012 0436   CREATININE 1.59* 09/29/2012 0436   CALCIUM 9.0 09/29/2012 0436   GFRNONAA 45* 09/29/2012 0436   GFRAA 52* 09/29/2012 0436    COAG Lab Results  Component Value Date   INR 1.94* 09/29/2012   INR 1.87* 09/28/2012   INR 1.72* 09/27/2012   No results found for this basename: PTT     Physical Examination BP Readings from Last 3 Encounters:  09/29/12  106/52  09/29/12 106/52   Temp Readings from Last 3 Encounters:  09/29/12 97.3 F (36.3 C) Oral  09/29/12 97.3 F (36.3 C) Oral   SpO2 Readings from Last 3 Encounters:  09/29/12 96%  09/29/12 96%   Pulse Readings from Last 3 Encounters:  09/29/12 91  09/29/12 91    General:  WDWN in NAD HENT: WNL Eyes: Pupils equal Pulmonary: normal non-labored breathing , without Rales, rhonchi,  wheezing Cardiac: RRR, without  Murmurs, rubs or gallops; Abdomen: soft, NT, no masses Skin: no rashes, ulcers noted Large amount of ecchymosis in Right upper and lower arm from [facility] area. Pt tender over medial upper arm. RUE soft,with normal motion and sensation Has area of ecchymosis over deltoid from flu shot Vascular Exam/Pulses:Palpable radial pulse bilat Biphasic brachial pu;se on right No pulsatile mass noted - right Brachial artery There are multiple puncture sites in the [facility] area  Extremities without ischemic changes, no Gangrene , no cellulitis; no open wounds;  Musculoskeletal: no muscle wasting or atrophy  Neurologic: A&O X 3; Appropriate Affect ;  SENSATION: normal; MOTOR FUNCTION: Pt has good and equal strength in all extremities - 5/5 Speech is fluent/normal  Non-Invasive Vascular Imaging: pend  ASSESSMENT/PLAN: Large antecubital hematoma right arm which is soft and min tender in medial upper arm- no sign of compartment syndrome Pt on heparin/coumadin for A. Fib with multiple areas of bruising Warm compresses to area Duplex of right brachial artery  to r/o pseudo aneurysm PT for ROM Continue No BP, IV Lab draws in right arm

## 2012-09-29 NOTE — Consult Note (Addendum)
History and exam details as above per Regina's note.  Right upper extremity 2+ radial and brachial pulse.  He has a 5 x 7 cm hematoma in the right antecubital region.  Neuro exam shows no sensory or motor deficit currently.  He apparently has had some intermittent sensory loss in hand.  His forearm and upper arm compartments are soft without any significant edema.  Pt is on anticoagulation and thought to be high risk to stop this.  Unknown etiology of the hematoma by pt history or chart review.  Will duplex to make sure no pseudoaneurysm of brachial artery.  Ok to continue anticoagulation for now as long as no progression of neuro symptoms or skin compromise.  However, if symptoms worsen and he fails this conservative management we would need to stop his anticoagulation for several days in order to evacuate the hematoma and prevent it from recurring.  He does not have a compartment syndrome. Will follow as consult.  Fabienne Bruns, MD [facility] [place] Office: [phone] Pager: [phone]

## 2012-09-29 NOTE — Progress Notes (Signed)
Subjective: Pt reports improvement in his sore throat. No stridor. Tolerated some liquid po.  Objective: Vital signs in last 24 hours: Temp:  [97.3 F (36.3 C)-98.3 F (36.8 C)] 97.3 F (36.3 C) (10/07 0749) Pulse Rate:  [68-91] 91  (10/07 0749) Resp:  [16-22] 22  (10/07 0400) BP: (95-138)/(52-69) 106/52 mmHg (10/07 0749) SpO2:  [96 %-100 %] 96 % (10/07 0749) Weight:  [91.6 kg (201 lb 15.1 oz)] 91.6 kg (201 lb 15.1 oz) (10/07 0400)  Physical Exam:  General: Pleasant male in NAD. A&O x 3. Head: No facial asymmetry or swelling  Eyes: No conj pallor or icterus. His pupils are equal, round, reactive to light. Extraocular motion is intact.  ENT: Examination of the ears shows normal auricles and external auditory canals bilaterally. Nasal examination shows congested mucosa, septum, turbinates. No acute bleeding. Facial examination shows no asymmetry. Palpation of the face elicit no significant tenderness. Oral cavity examination shows mild edema and erythema or the left tonsils. Palpation of the neck reveals no lymphadenopathy or mass. The trachea is midline. The thyroid is not significantly enlarged. Cranial nerves 2-12 are all grossly intact.   Basename 09/29/12 0436 09/28/12 1520  WBC 7.9 11.0*  HGB 12.7* 13.5  HCT 38.8* 39.8  PLT 179 202    Basename 09/29/12 0436 09/28/12 1520  NA 135 138  K 3.9 4.2  CL 93* 95*  CO2 34* 32  GLUCOSE 95 90  BUN 45* 41*  CREATININE 1.59* 1.42*  CALCIUM 9.0 9.6    Medications:  I have reviewed the patient's current medications. Scheduled:   . antiseptic oral rinse  15 mL Mouth Rinse BID  . aspirin EC  81 mg Oral Daily  . carvedilol  3.125 mg Oral BID WC  . clindamycin (CLEOCIN) IV  600 mg Intravenous Q8H  . enoxaparin (LOVENOX) injection  1 mg/kg Subcutaneous Q12H  . flumazenil  0.5 mg Intravenous Once  . furosemide  80 mg Intravenous BID  . losartan  25 mg Oral Daily  . potassium chloride  20 mEq Oral BID  . simvastatin  40 mg Oral  q1800  . sodium chloride  10-40 mL Intracatheter Q12H  . sodium chloride  3 mL Intravenous Q12H  . warfarin  2.5 mg Oral ONCE-1800  . Warfarin - Pharmacist Dosing Inpatient   Does not apply q1800  . DISCONTD: enoxaparin (LOVENOX) injection  1 mg/kg Subcutaneous Once  . DISCONTD: enoxaparin (LOVENOX) injection  1 mg/kg Subcutaneous BID  . DISCONTD: warfarin  5 mg Oral ONCE-1800   ZOX:WRUEAV chloride, acetaminophen, ALPRAZolam, guaiFENesin, morphine injection, ondansetron (ZOFRAN) IV, phenol, sodium chloride, sodium chloride  Assessment/Plan: The patient's left tonsillitis and peritonsillar cellulitis continues to improve.  - Continue IV clindamycin.  - Advance diet as tolerated.  - Will follow.   LOS: 8 days   Johnnie Goynes,SUI W 09/29/2012, 7:53 AM

## 2012-09-29 NOTE — Progress Notes (Signed)
Patient Name: [facility]      SUBJECTIVE: s/p TEEDCCV for AFib RVR in setting of severe cardiomyopathy and prior CABG; EF noted 15% ? Tachy component  Swelling in R forearm, with tingling of fingers  still w significant odynophagia\ Breathing ok    Past Medical History  Diagnosis Date  . CAD (coronary artery disease)   . Chronic systolic heart failure   . Ischemic cardiomyopathy     EF 15 to 20% by TTE and TEE in 09/2012.  Severe LV dysfunction  . HTN (hypertension)   . Hyperlipidemia   . Obesity     BMI 31 in 09/2012  . Atrial fibrillation   09/22/2012  . implantable cardiac defibrillator-Biotronik     Device Implanted 2006 Reimplanted 2012 : Explanted  and Reimplanted  7/12    PHYSICAL EXAM Filed Vitals:   09/28/12 2330 09/29/12 0000 09/29/12 0400 09/29/12 0749  BP:  111/69 102/65 106/52  Pulse:  69 68 91  Temp: 98.3 F (36.8 C)  97.5 F (36.4 C) 97.3 F (36.3 C)  TempSrc: Oral  Oral Oral  Resp:  20 22   Height:      Weight:   201 lb 15.1 oz (91.6 kg)   SpO2:  98% 100% 96%    Well developed and nourished in mod discomfort but no acute distress HENT normal Neck supple with JVP-flat Clear Regular rate and rhythm, no murmurs or gallops Abd-soft with active BS No Clubbing cyanosis edema  sweiling R forearm with intact pulse Skin-warm and dry A & Oriented  Grossly normal sensory and motor function  TELEMETRY: Reviewed telemetry pt in *NSR    Intake/Output Summary (Last 24 hours) at 09/29/12 1042 Last data filed at 09/29/12 0200  Gross per 24 hour  Intake  900.5 ml  Output   1500 ml  Net -599.5 ml    LABS: Basic Metabolic Panel:  Lab 09/29/12 1610 09/28/12 1520 09/26/12 0455 09/25/12 0438 09/24/12 0515 09/23/12 0506  NA 135 138 136 134* 137 139  K 3.9 4.2 4.3 4.3 4.1 4.1  CL 93* 95* 101 101 104 107  CO2 34* 32 26 23 22 20   GLUCOSE 95 90 88 92 82 74  BUN 45* 41* 30* 31* 32* 33*  CREATININE 1.59* 1.42* 1.49* 1.48* 1.55* 1.49*  CALCIUM 9.0 9.6  -- -- -- --  MG -- -- -- 1.6 -- --  PHOS -- -- -- -- -- --   Cardiac Enzymes: No results found for this basename: CKTOTAL:3,CKMB:3,CKMBINDEX:3,TROPONINI:3 in the last 72 hours CBC:  Lab 09/29/12 0436 09/28/12 1520 09/27/12 0500 09/26/12 0455 09/25/12 0438 09/24/12 0515 09/23/12 0506  WBC 7.9 11.0* 4.3 4.6 3.9* 5.0 4.3  NEUTROABS -- -- -- -- -- -- --  HGB 12.7* 13.5 12.9* 12.7* 12.3* 12.8* 13.1  HCT 38.8* 39.8 39.4 38.6* 37.3* 38.5* 39.3  MCV 80.5 80.6 80.7 80.4 80.6 81.2 80.7  PLT 179 202 184 171 159 150 142*   PROTIME:  Basename 09/29/12 0436 09/28/12 1520 09/27/12 0500  LABPROT 21.4* 20.8* 19.6*  INR 1.94* 1.87* 1.72*   Liver Function Tests: No results found for this basename: AST:2,ALT:2,ALKPHOS:2,BILITOT:2,PROT:2,ALBUMIN:2 in the last 72 hours No results found for this basename: LIPASE:2,AMYLASE:2 in the last 72 hours BNP: BNP (last 3 results)  Basename 09/22/12 0328 09/21/12 0515  PROBNP 3157.0* 2267.0*     Device Interrogation: as noted bellow   ASSESSMENT AND PLAN:  Patient Active Hospital Problem List: Atrial fibrillation   (09/22/2012)  Accelerated hypertension (09/21/2012)   CAD (coronary artery disease) s/p cabg (09/21/2012)   AICD (automatic cardioverter/defibrillator) present (09/21/2012)   Acute on chronic systolic heart failure (09/25/2012)   Odynophagia (09/26/2012)   Ischemic cardiomyopathy  ? additional rate component (09/29/2012)   Will continue coumadin and interrogate device and assess AFib burden  The device is single chamber but there is >10% beats faster than 130 and 15-20%> 120 much of which may be AFib  Will add dig and would be candidate for bidil now available in cheap combination  Will ask vascular to see to exclude compartment syndrome in arm  He would be very high risk of stroke to disconintue his hep/coumadini    *    Signed, Sherryl Manges MD  09/29/2012

## 2012-09-29 NOTE — Progress Notes (Signed)
CARDIAC REHAB PHASE I   PRE:  Rate/Rhythm: 77SR PACs  BP:  Supine: 140/88  Sitting:   Standing:    SaO2: 96%RA  MODE:  Ambulation: 310 ft   POST:  Rate/Rhythem: 122ST PACs  BP:  Supine:   Sitting: 120/91  Standing:    SaO2: 98%RA 1409-1437 Pt. Walked 310 ft on RA with gait belt use with steady gait. Tolerated well. To recliner after walk. No c/o cp but he did state that right arm and left knee sore.   Duanne Limerick

## 2012-09-29 NOTE — Progress Notes (Signed)
VASCULAR LAB PRELIMINARY  PRELIMINARY  PRELIMINARY  PRELIMINARY  Right upper extremity arterial ultrasound completed.    Preliminary report:  There is no pseudoaneurysm noted.  There is a large hematoma noted at [facility] and a smaller hematoma noted at shoulder.    Keli Buehner, 09/29/2012, 2:15 PM

## 2012-09-29 NOTE — Progress Notes (Signed)
ANTICOAGULATION CONSULT NOTE - Follow Up Consult  Pharmacy Consult for warfarin Indication: atrial fibrillation  No Known Allergies  Patient Measurements: Heparin Dosing Weight: 98kg  Vital Signs: Temp: 97.3 F (36.3 C) (10/07 0749) Temp src: Oral (10/07 0749) BP: 106/52 mmHg (10/07 0749) Pulse Rate: 91  (10/07 0749)  Labs:  Basename 09/29/12 0436 09/28/12 1520 09/27/12 0500  HGB 12.7* 13.5 --  HCT 38.8* 39.8 39.4  PLT 179 202 184  APTT -- -- --  LABPROT 21.4* 20.8* 19.6*  INR 1.94* 1.87* 1.72*  HEPARINUNFRC -- -- 0.56  CREATININE 1.59* 1.42* --  CKTOTAL -- -- --  CKMB -- -- --  TROPONINI -- -- --    Estimated Creatinine Clearance: 55 ml/min (by C-G formula based on Cr of 1.59).   Medications:  Lovenox 90mg  SQ Q12H  Assessment: 65yom continues on lovenox. CBC is stable. INR after a dose of coumadin is 1.94. No bleeding noted  Goal of Therapy:  INR goal 2-3 Monitor platelets by anticoagulation protocol: Yes   Plan:  1. Repeat coumadin 2.5mg  PO x 1 tonight 2. Continue lovenox 90mg  SQ Q12H 3. F/u AM INR   Jovani Colquhoun, Drake Leach 09/29/2012,11:17 AM

## 2012-09-30 LAB — CBC
Platelets: 170 10*3/uL (ref 150–400)
RBC: 4.86 MIL/uL (ref 4.22–5.81)
WBC: 4.8 10*3/uL (ref 4.0–10.5)

## 2012-09-30 LAB — BASIC METABOLIC PANEL
CO2: 33 mEq/L — ABNORMAL HIGH (ref 19–32)
Chloride: 93 mEq/L — ABNORMAL LOW (ref 96–112)
Sodium: 135 mEq/L (ref 135–145)

## 2012-09-30 LAB — PROTIME-INR
INR: 1.87 — ABNORMAL HIGH (ref 0.00–1.49)
Prothrombin Time: 20.8 seconds — ABNORMAL HIGH (ref 11.6–15.2)

## 2012-09-30 MED ORDER — LORAZEPAM 2 MG/ML IJ SOLN
2.0000 mg | INTRAMUSCULAR | Status: DC | PRN
  Administered 2012-09-30 – 2012-10-01 (×3): 2 mg via INTRAVENOUS
  Filled 2012-09-30 (×2): qty 1

## 2012-09-30 MED ORDER — POTASSIUM CHLORIDE 10 MEQ/100ML IV SOLN
10.0000 meq | INTRAVENOUS | Status: AC
  Administered 2012-09-30 – 2012-10-01 (×2): 10 meq via INTRAVENOUS
  Filled 2012-09-30: qty 200

## 2012-09-30 MED ORDER — FUROSEMIDE 10 MG/ML IJ SOLN
40.0000 mg | Freq: Two times a day (BID) | INTRAMUSCULAR | Status: DC
  Administered 2012-09-30: 40 mg via INTRAVENOUS
  Filled 2012-09-30 (×2): qty 4

## 2012-09-30 MED ORDER — SODIUM CHLORIDE 0.9 % IV SOLN
500.0000 mg | Freq: Two times a day (BID) | INTRAVENOUS | Status: AC
  Administered 2012-10-01 – 2012-10-07 (×14): 500 mg via INTRAVENOUS
  Filled 2012-09-30 (×17): qty 5

## 2012-09-30 MED ORDER — LORAZEPAM 2 MG/ML IJ SOLN
INTRAMUSCULAR | Status: AC
  Administered 2012-10-01: 2 mg via INTRAVENOUS
  Filled 2012-09-30: qty 1

## 2012-09-30 MED ORDER — WARFARIN SODIUM 4 MG PO TABS
4.0000 mg | ORAL_TABLET | Freq: Once | ORAL | Status: AC
  Administered 2012-09-30: 4 mg via ORAL
  Filled 2012-09-30: qty 1

## 2012-09-30 MED ORDER — SODIUM CHLORIDE 0.9 % IV SOLN
1000.0000 mg | Freq: Once | INTRAVENOUS | Status: AC
  Administered 2012-10-01: 1000 mg via INTRAVENOUS
  Filled 2012-09-30: qty 10

## 2012-09-30 NOTE — Progress Notes (Signed)
ANTICOAGULATION CONSULT NOTE - Follow Up Consult  Pharmacy Consult for warfarin Indication: atrial fibrillation  No Known Allergies  Vital Signs: Temp: 97.6 F (36.4 C) (10/08 0812) Temp src: Oral (10/08 0812) BP: 76/47 mmHg (10/08 0812)  Labs:  Basename 09/30/12 0500 09/29/12 0436 09/28/12 1520  HGB 12.7* 12.7* --  HCT 39.6 38.8* 39.8  PLT 170 179 202  APTT -- -- --  LABPROT 20.8* 21.4* 20.8*  INR 1.87* 1.94* 1.87*  HEPARINUNFRC -- -- --  CREATININE 1.61* 1.59* 1.42*  CKTOTAL -- -- --  CKMB -- -- --  TROPONINI -- -- --    Estimated Creatinine Clearance: 50 ml/min (by C-G formula based on Cr of 1.61).   Medications:  Lovenox 90mg  SQ Q12H  Assessment: 65yom continues on lovenox. CBC is stable. INR decreased to 1.87. Pt has a hematoma at [facility] and smaller hematoma at shoulder but ok to continue with anticoagulation as long as no progression of neuro symptoms or skin compromise per MD.   Goal of Therapy:  INR goal 2-3 Monitor platelets by anticoagulation protocol: Yes   Plan:  1. Coumadin 4mg  PO x 1 tonight 2. Continue lovenox 90mg  SQ Q12H 3. F/u AM INR and signs/symptoms of bleeding   Tramond Slinker, Drake Leach 09/30/2012,8:43 AM

## 2012-09-30 NOTE — Progress Notes (Signed)
CARDIAC REHAB PHASE I   PRE:  Rate/Rhythm: 78SR PACs,PVCs  BP:  Supine:   Sitting: 116/87  Standing:    SaO2: 97%RA  MODE:  Ambulation: 340 ft   POST:  Rate/Rhythem: 98SRPACs  BP:  Supine:   Sitting: 129/93  Standing:    SaO2: 97%RA 1438-1500 Pt walked 340 ft pushing IV pole and with use of gait belt. Pt tolerated well. C/o arms and knees hurting. To recliner after walk. Call bell in reach. Did not wish to go farther.   Duanne Limerick

## 2012-09-30 NOTE — Progress Notes (Signed)
   ELECTROPHYSIOLOGY ROUNDING NOTE    Patient Name: Louis Ford Date of Encounter: 09-30-2012    SUBJECTIVE:Patient feels a little better.  No chest pain or shortness of breath.  Still with swelling in right forearm, still with difficulty swallowing.   Seen by vascular yesterday with thoughts that it was a hematoma and not compartment syndrome.  Dr Darrick Penna stated okay to continue anticoagulation for now as long as no progression of neuro symptoms or skin compromise.  Recs from ENT yesterday were to continue IV clindamycin and advance diet as tolerated  TELEMETRY: Reviewed telemetry pt in sinus rhythm with PVC's and short runs of ?atrial tach Filed Vitals:   09/29/12 2343 09/30/12 0102 09/30/12 0345 09/30/12 0500  BP: 84/68 125/33 102/72   Pulse:      Temp: 97.6 F (36.4 C)  97.7 F (36.5 C)   TempSrc: Oral  Oral   Resp: 16  16   Height:      Weight:    193 lb 9 oz (87.8 kg)  SpO2: 96%  98%    Well developed and nourished in no acute distress HENT normal Neck supple with JVP-flat Clear Regular rate and rhythm, no murmurs or gallops Abd-soft with active BS No Clubbing cyanosis edema; hematoma r forearm Skin-warm and dry A & Oriented  Grossly normal sensory and motor function    Intake/Output Summary (Last 24 hours) at 09/30/12 0807 Last data filed at 09/30/12 0600  Gross per 24 hour  Intake    660 ml  Output   1500 ml  Net   -840 ml    LABS: Basic Metabolic Panel:  Basename 09/30/12 0500 09/29/12 0436  NA 135 135  K 4.1 3.9  CL 93* 93*  CO2 33* 34*  GLUCOSE 76 95  BUN 44* 45*  CREATININE 1.61* 1.59*  CALCIUM 9.3 9.0  MG -- --  PHOS -- --   CBC:  Basename 09/30/12 0500 09/29/12 0436  WBC 4.8 7.9  NEUTROABS -- --  HGB 12.7* 12.7*  HCT 39.6 38.8*  MCV 81.5 80.5  PLT 170 179   INR: 1.87  DEVICE INTERROGATION: Device interrogated by industry 09-24-2012.  Normal function.  Approximately 30% of ventricular beats greater than 120bpm.  Single chamber ICD,  no data on atrial burden aside from fast V rates.  Patient does have dual chamber ICD with atrial port plugged.    Patient Active Hospital Problem List: Atrial fibrillation   (09/22/2012)   Dyspnea (09/21/2012)   Accelerated hypertension (09/21/2012)   CAD (coronary artery disease) s/p cabg (09/21/2012)   Acute on chronic systolic heart failure (09/25/2012)   Odynophagia (09/26/2012)   Epistaxis (09/27/2012)   Ischemic cardiomyopathy  ? additional rate component (09/29/2012)     BP borderline limiting uptitration of meds.   Continue dig and diuretics; decrease lasix LMWH until therapeutic INR; will hold asa for now Will checkwith DB aldactone vs bidil when BP allows

## 2012-09-30 NOTE — Progress Notes (Signed)
Bloody Oozing noted from LUA PICC. No s/sx of dislodgement. Arm wrap with guaze. Will continue to monitor.

## 2012-09-30 NOTE — Significant Event (Signed)
Pt with hx if CVA and ?of seizure.  Noted by nursing staff to have burning sensation in his head earlier in the day which resolved.    Noted to have seizure activity with rhythmic movement of left arm and turning of head.  This last for about 1 to 2 minutes, and then resolved spontaneously.  Pt is currently post-ictal.  Hemodynamics, and oxygenation stable.  Have written for ativan prn for recurrence of seizure.  Bedside staff have placed call to primary team to further assess.  Coralyn Helling, MD 09/30/2012, 9:18 PM [phone]

## 2012-09-30 NOTE — Progress Notes (Signed)
Noted pt having  Artifact on monitor, in to check on patient. Pt was found leaning to the left of bed with head back and drooling. Left arm was stiff with rapid jerking type movement. Activity lasting from 2105-2107.Airway was maintained and suctioned. BP was taken and stable. Dr. Craige Cotta notified. [facility] notified and Dr. Carolanne Grumbling notified as requested by [facility] Pt began to awaken and was agitated, confused and unable to follow commands. Ativan 2mg  IV given. Will monitor closely.

## 2012-09-30 NOTE — Progress Notes (Signed)
Subjective: No issues overnight. No stridor. Tolerated some liquid po.  Objective: Vital signs in last 24 hours: Temp:  [97.4 F (36.3 C)-97.7 F (36.5 C)] 97.4 F (36.3 C) (10/08 1157) Pulse Rate:  [91-93] 91  (10/08 0948) Resp:  [16-20] 16  (10/08 0345) BP: (76-125)/(33-72) 122/70 mmHg (10/08 0948) SpO2:  [95 %-100 %] 100 % (10/08 1157) Weight:  [87.8 kg (193 lb 9 oz)] 87.8 kg (193 lb 9 oz) (10/08 0500)  Physical Exam:  General: Pleasant male in NAD. A&O x 3.  Head: No facial asymmetry or swelling  Eyes: No conj pallor or icterus. His pupils are equal, round, reactive to light. Extraocular motion is intact.  ENT: Examination of the ears shows normal auricles and external auditory canals bilaterally. Nasal examination shows congested mucosa, septum, turbinates. No acute bleeding. Facial examination shows no asymmetry. Palpation of the face elicit no significant tenderness. Oral cavity examination shows mild edema and erythema or the left tonsils. Palpation of the neck reveals no lymphadenopathy or mass. The trachea is midline. The thyroid is not significantly enlarged. Cranial nerves 2-12 are all grossly intact.  Basename 09/30/12 0500 09/29/12 0436  WBC 4.8 7.9  HGB 12.7* 12.7*  HCT 39.6 38.8*  PLT 170 179    Basename 09/30/12 0500 09/29/12 0436  NA 135 135  K 4.1 3.9  CL 93* 93*  CO2 33* 34*  GLUCOSE 76 95  BUN 44* 45*  CREATININE 1.61* 1.59*  CALCIUM 9.3 9.0    Medications:  I have reviewed the patient's current medications. Scheduled:   . antiseptic oral rinse  15 mL Mouth Rinse BID  . carvedilol  3.125 mg Oral BID WC  . clindamycin (CLEOCIN) IV  600 mg Intravenous Q8H  . enoxaparin (LOVENOX) injection  1 mg/kg Subcutaneous Q12H  . flumazenil  0.5 mg Intravenous Once  . furosemide  40 mg Intravenous BID  . losartan  25 mg Oral Daily  . potassium chloride  20 mEq Oral BID  . simvastatin  40 mg Oral q1800  . sodium chloride  10-40 mL Intracatheter Q12H  .  sodium chloride  3 mL Intravenous Q12H  . warfarin  2.5 mg Oral ONCE-1800  . warfarin  4 mg Oral ONCE-1800  . Warfarin - Pharmacist Dosing Inpatient   Does not apply q1800  . DISCONTD: aspirin EC  81 mg Oral Daily  . DISCONTD: furosemide  80 mg Intravenous BID  . DISCONTD: warfarin  2.5 mg Oral ONCE-1800   ZOX:WRUEAV chloride, acetaminophen, ALPRAZolam, guaiFENesin, morphine injection, ondansetron (ZOFRAN) IV, phenol, sodium chloride, sodium chloride  Assessment/Plan: The patient's left tonsillitis and peritonsillar cellulitis continues to improve.  - Continue IV clindamycin.  - Regular diet.  - Will follow.    LOS: 9 days   Anadia Helmes,SUI W 09/30/2012, 1:10 PM

## 2012-09-30 NOTE — Consult Note (Signed)
Pt with slightly less pain today Right upper extremity ecchymosis unchanged antecubital area but hematoma feels softer 2+ right radial pulse   Duplex no pseudoaneurysm  Continue elevation warm compresses observation Will sign off at this point  Fabienne Bruns, MD [facility] Office: [phone] Pager: [phone]

## 2012-09-30 NOTE — Consult Note (Signed)
[facility] CONSULT NOTE     Reason for Consult: Seizure.  CC: Seizure with postictal somnolence.    HPI:    Louis Ford is an 66 y.o. male who initially presented to [facility] on 9/29 for acute shortness of breath and was diagnosed with CHF exacerbation.   PMHx includes AICD (automatic cardioverter/defibrillator), ischemic cardiomyopathy, CAD s/p CABG, HLD and acute on chronic CHF. He has a seizure history after ICD placement but is not on seizure medications. Atrial fibrillation noted during this admission, with cardiology recommending anticoagulation due to very high risk of ischemic stroke. He has experienced hematoma formation since starting anticoagulation, including one involving a tonsil following TEE.   Was doing well today, able to walk 340 feet with PT using gait belt. This evening at 9:42 PM, nursing checked on patient and found that he was "leaning to the left of bed with head back and drooling. Left arm was stiff with rapid jerking type movement. Activity lasting from 2105-2107.Airway was maintained and suctioned. BP was taken and stable. Dr. Craige Cotta notified. [facility] notified and Dr. Carolanne Grumbling notified as requested by [facility]. Pt began to awaken and was agitated, confused and unable to follow commands. Ativan 2mg  IV given."  Neurology was called to further evaluate for possible seizure.     Past Medical History  Diagnosis Date  . CAD (coronary artery disease)   . Chronic systolic heart failure   . Ischemic cardiomyopathy     EF 15 to 20% by TTE and TEE in 09/2012.  Severe LV dysfunction  . HTN (hypertension)   . Hyperlipidemia   . Obesity     BMI 31 in 09/2012  . Atrial fibrillation   09/22/2012  . implantable cardiac defibrillator-Biotronik     Device Implanted 2006 Reimplanted 2012 : Explanted  and Reimplanted  7/12  . Stroke     Past Surgical History  Procedure Date  . Coronary artery bypass graft   . Tee without  cardioversion 09/24/2012    Procedure: TRANSESOPHAGEAL ECHOCARDIOGRAM (TEE);  Surgeon: Vesta Mixer, MD;  Location: [facility] ENDOSCOPY;  Service: Cardiovascular;  Laterality: N/A;  dave/anesth, dl, cindy/echo   . Cardioversion 09/24/2012    Procedure: CARDIOVERSION;  Surgeon: Vesta Mixer, MD;  Location: [facility] ENDOSCOPY;  Service: Cardiovascular;  Laterality: N/A;  . Icd     History reviewed. No pertinent family history.  Social History:  reports that he quit smoking about 4 years ago. His smoking use included Pipe. He quit smokeless tobacco use about 4 years ago. He reports that he does not drink alcohol or use illicit drugs.  No Known Allergies  Medications:    Scheduled:   . antiseptic oral rinse  15 mL Mouth Rinse BID  . carvedilol  3.125 mg Oral BID WC  . clindamycin (CLEOCIN) IV  600 mg Intravenous Q8H  . enoxaparin (LOVENOX) injection  1 mg/kg Subcutaneous Q12H  . flumazenil  0.5 mg Intravenous Once  . furosemide  40 mg Intravenous BID  . losartan  25 mg Oral Daily  . potassium chloride  10 mEq Intravenous Q1 Hr x 2  . potassium chloride  20 mEq Oral BID  . simvastatin  40 mg Oral q1800  . sodium chloride  10-40 mL Intracatheter Q12H  . sodium chloride  3 mL Intravenous Q12H  . warfarin  4 mg Oral ONCE-1800  .  Warfarin - Pharmacist Dosing Inpatient   Does not apply q1800  . DISCONTD: aspirin EC  81 mg Oral Daily  . DISCONTD: furosemide  80 mg Intravenous BID   Continuous:  ZOX:WRUEAV chloride, acetaminophen, ALPRAZolam, guaiFENesin, LORazepam, morphine injection, ondansetron (ZOFRAN) IV, phenol, sodium chloride, sodium chloride   Review of Systems - Unable to obtain due to AMS.    Blood pressure 79/60, pulse 90, temperature 98.1 F (36.7 C), temperature source Oral, resp. rate 19, height 5\' 11"  (1.803 m), weight 87.8 kg (193 lb 9 oz), SpO2 98.00%.   Neurologic Examination:   Mental Status: Somnolent. Arousable to a semi-awake state with continuous noxious stimuli.  Keeps eyes closed. Does not follow any verbal commands. No verbal output except for murmuring during noxious stimuli. Purposefully brushes examiner's hand away in response to noxious stimuli.  Cranial Nerves: II- Resists when examiner attempts to open eyes for pupillary exam. Pupils round and equal to ambient light.  II/IV/VI-Will not open eyes. Slightly disconjugate globes in context of somnolent state. Good eyelid closure strength bilaterally.  V and VII - Grimaces to noxious stimuli and closes eyes more tightly.  VIII-Does not respond to verbal commands.  IX/X-Does not open mouth spontaneously. Not following commands.   XI-Trapezii and SCMs grossly symmetric.  XII-Not following commands. Does not protrude tongue. Motor/Sensory: Moves all 4 extremities equally when leaning forward spontaneously during exam in semi-awake state. Withdraws with good strength to noxious symmetrically in upper and lower extremities. Tone and bulk are normal.  DTR's: 1+ brachioradialis and biceps bilaterally. Hypoactive patellae and achilles bilaterally. Right toe upgoing, left toe mute.  Cerebellar: Does not follow commands.  Gait/Station: Deferred. Has grossly normal truncal control when leaning forward in bed.   Lab Results  Component Value Date/Time   CHOL 133 09/22/2012  3:28 AM    Results for orders placed during the hospital encounter of 09/21/12 (from the past 48 hour(s))  CBC     Status: Abnormal   Collection Time   09/29/12  4:36 AM      Component Value Range Comment   WBC 7.9  4.0 - 10.5 K/uL    RBC 4.82  4.22 - 5.81 MIL/uL    Hemoglobin 12.7 (*) 13.0 - 17.0 g/dL    HCT 40.9 (*) 81.1 - 52.0 %    MCV 80.5  78.0 - 100.0 fL    MCH 26.3  26.0 - 34.0 pg    MCHC 32.7  30.0 - 36.0 g/dL    RDW 91.4 (*) 78.2 - 15.5 %    Platelets 179  150 - 400 K/uL   BASIC METABOLIC PANEL     Status: Abnormal   Collection Time   09/29/12  4:36 AM      Component Value Range Comment   Sodium 135  135 - 145 mEq/L     Potassium 3.9  3.5 - 5.1 mEq/L    Chloride 93 (*) 96 - 112 mEq/L    CO2 34 (*) 19 - 32 mEq/L    Glucose, Bld 95  70 - 99 mg/dL    BUN 45 (*) 6 - 23 mg/dL    Creatinine, Ser 9.56 (*) 0.50 - 1.35 mg/dL    Calcium 9.0  8.4 - 21.3 mg/dL    GFR calc non Af Amer 45 (*) >90 mL/min    GFR calc Af Amer 52 (*) >90 mL/min   PROTIME-INR     Status: Abnormal   Collection Time   09/29/12  4:36 AM  Component Value Range Comment   Prothrombin Time 21.4 (*) 11.6 - 15.2 seconds    INR 1.94 (*) 0.00 - 1.49   CBC     Status: Abnormal   Collection Time   09/30/12  5:00 AM      Component Value Range Comment   WBC 4.8  4.0 - 10.5 K/uL    RBC 4.86  4.22 - 5.81 MIL/uL    Hemoglobin 12.7 (*) 13.0 - 17.0 g/dL    HCT 86.5  78.4 - 69.6 %    MCV 81.5  78.0 - 100.0 fL    MCH 26.1  26.0 - 34.0 pg    MCHC 32.1  30.0 - 36.0 g/dL    RDW 29.5 (*) 28.4 - 15.5 %    Platelets 170  150 - 400 K/uL   BASIC METABOLIC PANEL     Status: Abnormal   Collection Time   09/30/12  5:00 AM      Component Value Range Comment   Sodium 135  135 - 145 mEq/L    Potassium 4.1  3.5 - 5.1 mEq/L    Chloride 93 (*) 96 - 112 mEq/L    CO2 33 (*) 19 - 32 mEq/L    Glucose, Bld 76  70 - 99 mg/dL    BUN 44 (*) 6 - 23 mg/dL    Creatinine, Ser 1.32 (*) 0.50 - 1.35 mg/dL    Calcium 9.3  8.4 - 44.0 mg/dL    GFR calc non Af Amer 44 (*) >90 mL/min    GFR calc Af Amer 51 (*) >90 mL/min   PROTIME-INR     Status: Abnormal   Collection Time   09/30/12  5:00 AM      Component Value Range Comment   Prothrombin Time 20.8 (*) 11.6 - 15.2 seconds    INR 1.87 (*) 0.00 - 1.49     No results found.   Assessment/Plan:   Assessment: 1. Seizure, now with postictal AMS. He has a prior history of seizures, which began after placement of his AICD. Not currently on seizure prophylaxis. Given his recent bleeding complications, ICH as a trigger for the seizure is on the differential diagnosis.  2. CHF and atrial fibrillation. Both increase the patient's  risk of cardioembolic stroke. Currently on Coumadin with an INR of 1.87.  Recommendations: 1. Load with IV Keppra 1000 mg x 1 now, then start scheduled Keppra 500 mg IV BID.  2. EEG.  3. STAT CT of head.  4. Unable to obtain MRI due to AICD.  5. Seizure precautions. Falls risk.  6. Would hold anticoagulation until CT of brain obtained.    Electronically signed: Dr. Caryl Pina   09/30/2012, 10:37 PM

## 2012-10-01 ENCOUNTER — Encounter (HOSPITAL_COMMUNITY): Payer: Self-pay | Admitting: Internal Medicine

## 2012-10-01 ENCOUNTER — Inpatient Hospital Stay (HOSPITAL_COMMUNITY): Payer: PRIVATE HEALTH INSURANCE

## 2012-10-01 DIAGNOSIS — R569 Unspecified convulsions: Secondary | ICD-10-CM

## 2012-10-01 DIAGNOSIS — N289 Disorder of kidney and ureter, unspecified: Secondary | ICD-10-CM

## 2012-10-01 LAB — BASIC METABOLIC PANEL
CO2: 30 mEq/L (ref 19–32)
GFR calc non Af Amer: 39 mL/min — ABNORMAL LOW (ref >90)
Glucose, Bld: 78 mg/dL (ref 70–99)
Potassium: 4.2 mEq/L (ref 3.5–5.1)
Sodium: 132 mEq/L — ABNORMAL LOW (ref 135–145)

## 2012-10-01 LAB — CBC
Hemoglobin: 11.6 g/dL — ABNORMAL LOW (ref 13.0–17.0)
RBC: 4.48 MIL/uL (ref 4.22–5.81)

## 2012-10-01 LAB — PROTIME-INR: INR: 1.89 — ABNORMAL HIGH (ref 0.00–1.49)

## 2012-10-01 MED ORDER — LORAZEPAM 2 MG/ML IJ SOLN: 2.0000 mg | Freq: Once | INTRAMUSCULAR | Status: DC

## 2012-10-01 NOTE — Progress Notes (Signed)
Noted events. Pt confused per RN. Will hold today. Will f/u tomorrow. Ethelda Chick CES, ACSM

## 2012-10-01 NOTE — Progress Notes (Addendum)
Subjective: Patient confused.  Was given an additional 2mg  of Ativan overnight for agitation.  CT reviewed and shows an abnormality in the left sylvian fissure.    Objective: Current vital signs: BP 104/47  Pulse 90  Temp 97.5 F (36.4 C) (Oral)  Resp 18  Ht 5\' 11"  (1.803 m)  Wt 97.1 kg (214 lb 1.1 oz)  BMI 29.86 kg/m2  SpO2 99% Vital signs in last 24 hours: Temp:  [97.5 F (36.4 C)-98.5 F (36.9 C)] 97.5 F (36.4 C) (10/09 1129) Pulse Rate:  [90] 90  10/02/2023 1717) Resp:  [14-34] 18  (10/09 1100) BP: (69-145)/(45-77) 104/47 mmHg (10/09 1100) SpO2:  [92 %-100 %] 99 % (10/09 1100) Weight:  [97.1 kg (214 lb 1.1 oz)] 97.1 kg (214 lb 1.1 oz) (10/09 0500)  Intake/Output from previous day: October 02, 2023 0701 - 10/09 0700 In: 899 [P.O.:120; I.V.:320; IV Piggyback:459] Out: 750 [Urine:750] Intake/Output this shift:   Nutritional status: Cardiac  Neurologic Exam: Mental Status: Lethargic.  Confused.  Follows some simple commands.   Cranial Nerves: II: Discs flat bilaterally; Visual fields grossly normal, pupils equal, round, reactive to light and accommodation III,IV, VI: ptosis not present, extra-ocular motions intact bilaterally V,VII: smile symmetric, facial light touch sensation normal bilaterally VIII: hearing normal bilaterally IX,X: gag reflex present XI: bilateral shoulder shrug XII: midline tongue extension Motor: Moves all extremities but formal testing not possible Sensory: Responds less to painful stimuli on the left Deep Tendon Reflexes: 2+ and symmetric throughout Plantars: Right: downgoing   Left: downgoing Cerebellar: Unable to perform CV: pulses palpable throughout   Lab Results: Basic Metabolic Panel:  Lab 10/01/12 1914 October 01, 2012 0500 09/29/12 0436 09/28/12 1520 09/26/12 0455 09/25/12 0438  NA 132* 135 135 138 136 --  K 4.2 4.1 3.9 4.2 4.3 --  CL 93* 93* 93* 95* 101 --  CO2 30 33* 34* 32 26 --  GLUCOSE 78 76 95 90 88 --  BUN 46* 44* 45* 41* 30* --    CREATININE 1.78* 1.61* 1.59* 1.42* 1.49* --  CALCIUM 8.9 9.3 9.0 -- -- --  MG -- -- -- -- -- 1.6  PHOS -- -- -- -- -- --    Liver Function Tests:  Lab 09/26/12 0455  AST 17  ALT 15  ALKPHOS 104  BILITOT 1.1  PROT 7.4  ALBUMIN 3.4*   No results found for this basename: LIPASE:5,AMYLASE:5 in the last 168 hours No results found for this basename: AMMONIA:3 in the last 168 hours  CBC:  Lab 10/01/12 0502 October 01, 2012 0500 09/29/12 0436 09/28/12 1520 09/27/12 0500  WBC 6.7 4.8 7.9 11.0* 4.3  NEUTROABS -- -- -- -- --  HGB 11.6* 12.7* 12.7* 13.5 12.9*  HCT 35.6* 39.6 38.8* 39.8 39.4  MCV 79.5 81.5 80.5 80.6 80.7  PLT 163 170 179 202 184    Cardiac Enzymes: No results found for this basename: CKTOTAL:5,CKMB:5,CKMBINDEX:5,TROPONINI:5 in the last 168 hours  Lipid Panel: No results found for this basename: CHOL:5,TRIG:5,HDL:5,CHOLHDL:5,VLDL:5,LDLCALC:5 in the last 168 hours  CBG: No results found for this basename: GLUCAP:5 in the last 168 hours  Microbiology: Results for orders placed during the hospital encounter of 09/21/12  MRSA PCR SCREENING     Status: Normal   Collection Time   09/22/12  4:55 PM      Component Value Range Status Comment   MRSA by PCR NEGATIVE  NEGATIVE Final     Coagulation Studies:  Basename 10/01/12 0502 2012-10-01 0500 09/29/12 0436 09/28/12 1520  LABPROT 21.0* 20.8*  21.4* 20.8*  INR 1.89* 1.87* 1.94* 1.87*    Imaging: Ct Head Wo Contrast  10/01/2012  *RADIOLOGY REPORT*  Clinical Data: 66 year old male new onset seizure. Possible history of seizure disorder.  Altered mental status.  CT HEAD WITHOUT CONTRAST  Technique:  Contiguous axial images were obtained from the base of the skull through the vertex without contrast.  Comparison: Neck CT 09/26/2012.  Findings: Visualized paranasal sinuses and mastoids are clear. Visualized orbit soft tissues are within normal limits.  No acute scalp soft tissue findings.  Calcified atherosclerosis at the skull  base.  No ventriculomegaly. No intraventricular hemorrhage. No midline shift, mass effect, or evidence of mass lesion.  Scattered subcortical white matter hypodensity, mild for age.  Asymmetric hyperdensity along the left sylvian fissure.  This might be cortically based rather than being in the subarachnoid space. There is no subarachnoid or intracranial hemorrhage identified elsewhere.  Asymmetric hyperdensity associated with a left MCA branch on image 11 is stable from the prior.  No other suspicious intracranial vascular hyperdensity.  Basilar cisterns appear normal.  IMPRESSION: 1. Asymmetric hyperdensity along the left sylvian fissure of uncertain significance.  The appearance is atypical for a small volume of subarachnoid hemorrhage.  This might instead reflect a cortically based process, and it may be stable compared to the prior neck CT.  No associated mass effect. No other evidence of intracranial hemorrhage. The patient cannot have the brain MRI due to cardiac AICD. I think a repeat noncontrast head CT in 8-12 hours may be most helpful to evaluate for any change in the appearance of the left sylvian fissure. If the patient's condition or mental status deteriorates recommend stat repeat Head CT. 2.  Otherwise no acute intracranial abnormality.  Mild nonspecific white matter changes.  Case discussed by telephone with Nurse Hillery Aldo at the time of dictation. She advises that the patient has been on heparin/Lovenox for atrial fibrillation.  Furthermore, the retropharyngeal abnormality described on the comparison is favored by the clinical team to be hematoma following transesophageal echo.   Original Report Authenticated By: Harley Hallmark, M.D.     Medications:  I have reviewed the patient's current medications. Scheduled:   . antiseptic oral rinse  15 mL Mouth Rinse BID  . clindamycin (CLEOCIN) IV  600 mg Intravenous Q8H  . enoxaparin (LOVENOX) injection  1 mg/kg Subcutaneous Q12H  . flumazenil   0.5 mg Intravenous Once  . levetiracetam  1,000 mg Intravenous Once  . levetiracetam  500 mg Intravenous Q12H  . LORazepam  2 mg Intravenous Once  . potassium chloride  10 mEq Intravenous Q1 Hr x 2  . potassium chloride  20 mEq Oral BID  . simvastatin  40 mg Oral q1800  . sodium chloride  10-40 mL Intracatheter Q12H  . sodium chloride  3 mL Intravenous Q12H  . warfarin  4 mg Oral ONCE-1800  . Warfarin - Pharmacist Dosing Inpatient   Does not apply q1800  . DISCONTD: carvedilol  3.125 mg Oral BID WC  . DISCONTD: furosemide  40 mg Intravenous BID  . DISCONTD: losartan  25 mg Oral Daily    Assessment/Plan:  Patient Active Hospital Problem List:  Seizure (10/01/2012)   Assessment: Patient with seizure.  Has not returned to baseline.  Unclear if this is the effect of repeated Ativan doses or may represent a non-convulsive status vs. phenomenon related to abnormal imaging.   Plan: 1.  EEG today  2.  Repeat Head CT later today.  LOS: 10 days   Thana Farr, MD [facility] [phone] 10/01/2012  12:25 PM

## 2012-10-01 NOTE — Progress Notes (Signed)
EEG completed bedside

## 2012-10-01 NOTE — Progress Notes (Signed)
Transported pt to xray for [facility]. When pt was moved to CT table he became more awake and combative. The pt then started  To come around. Pt stated "What happened". Explained to pt he had a seizure then pt stated "I remember watching TV and I turned my head and saw fuzziness. Then I don't remember anything else."  Explained to pt that we needed him to lay down on CT table for head scan. Pt began to push staff away and became aggressive. Ativan 2mg  IV given. Head CT was completed. Pt returned to room. VSS. Will monitor.

## 2012-10-01 NOTE — Consult Note (Signed)
ONCOLOGY  HOSPITAL CONSULTATION NOTE  Freddi Schrager                                MR#: [id number]  DOB: 27-Mar-1946                       CSN#: [id number]  Referring MD: [facility] Reason for Consult: Factor 7 deficiency; Bleeding issues.   FAO:ZHYQ Citro is a 66 y.o. 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 Hematology had evaluated the patient at that time, which records are being requested (to [facility] [phone]/ Fax [phone]/ Hematology:  [phone] (MD unknown at this time) Patient not a good historian, but per available EPS records, he received DDAVP and Cryoprecipitate infusion for urgent procedure on 06/15/11. Patient had moved to [facility] in June to be cared by his family.He was admitted on 9/29 with acute CHF exacerbation. At the time, he received  Heparin post   TEE/DCCV on 10/02,  along with Coumadin . Platelets as of 9/29 were 147k. And were stable through 10/2 at 171k . INR was 1.55.  On 10/4 He developed RUE hematoma  Which per [facility], was negative for compartment syndrome. Coumadin was placed on hold, while continuing Heparin administration.Status complicated by Afib during this admission in the setting of hematoma formation which complicated the necessity to continue anticoagulation on this patient.   Moreover, he developed a seizure on10/08 requiring Neuro involvement and initiation of Keppra. Stat CT head on 10/08 was concerning for small subarachnoid hemorrhage.  PT/ INR  Today is 21 and 1.89 .Platelets today holding at 163k.   Patient is somewhat confused following his seizure.      While awaiting for the records, we were kindly requested to see Mr Park in consultation from the hematological standpoint.  PMH:  Past Medical History  Diagnosis Date  . CAD (coronary  artery disease)   . Chronic systolic heart failure   . Ischemic cardiomyopathy     EF 15 to 20% by TTE and TEE in 09/2012.  Severe LV dysfunction  . HTN (hypertension)   . Hyperlipidemia   . Obesity     BMI 31 in 09/2012  . Atrial fibrillation   09/22/2012  . implantable cardiac defibrillator-Biotronik     Device Implanted 2006 Reimplanted 2012 : Explanted  and Reimplanted  7/12  . Stroke   . 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 Coronary artery bypass graft   . Tee without cardioversion 09/24/2012    Procedure: TRANSESOPHAGEAL ECHOCARDIOGRAM (TEE);  Surgeon: Vesta Mixer, MD;  Location: [facility] ENDOSCOPY;  Service: Cardiovascular;  Laterality: N/A;  dave/anesth, dl, cindy/echo   . Cardioversion 09/24/2012    Procedure: CARDIOVERSION;  Surgeon: Vesta Mixer, MD;  Location: [facility] ENDOSCOPY;  Service: Cardiovascular;  Laterality: N/A;  . Icd     Allergies: No Known Allergies  Medications:   Prior to Admission:  No prescriptions prior to admission    NUU:VOZDGU chloride, acetaminophen, ALPRAZolam, guaiFENesin, LORazepam, morphine injection, ondansetron (ZOFRAN) IV, phenol, sodium chloride, sodium chloride  ROS: unable to obtain, patient confused, falls easily to sleep  Family History:  History reviewed. No pertinent family history.  No family history of bleeding disorders.  Social History:  reports that he quit smoking about 4 years ago. His smoking use included Pipe. He quit smokeless tobacco use about 4 years ago. He reports that he does not drink alcohol or use illicit drugs.  Physical Exam    Filed Vitals:   10/01/12 1129  BP:   Pulse:   Temp: 97.5 F (36.4 C)  Resp:      Filed Weights   09/29/12 0400 09/30/12 0500 10/01/12 0500  Weight: 201 lb 15.1 oz (91.6 kg) 193 lb 9 oz (87.8 kg) 214 lb 1.1 oz (97.1 kg)   General:   66 year old AAM in no acute distress, well-developed and well-nourished. Responds to  stimulus, but falls back asleep.  HEENT: Normocephalic, atraumatic, eyes closed.Oral cavity unable to assess, patient resists to opening mouth.  Neck supple. no thyromegaly, no cervical or supraclavicular adenopathy  Lungs clear bilaterally . No wheezing, rhonchi or rales. No axillary masses. Breasts: not examined. Cardiac regular rate and rhythm normal S1-S2, no murmur , rubs or gallops Abdomen soft nontender , bowel sounds x4. No HSM. No masses palpable.  GU/rectal: deferred. Extremities no clubbing cyanosis or edema.R arm with large bruise/ softening hematoma around the antecubital area.  Musculoskeletal: no apparent spinal tenderness.  Neuro:eyes remain closed.PAtient is confused, responds to verbal and tactile stimuli, but non interactive at this time.   Labs:  CBC   Lab 10/01/12 0502 09/30/12 0500 09/29/12 0436 09/28/12 1520 09/27/12 0500  WBC 6.7 4.8 7.9 11.0* 4.3  HGB 11.6* 12.7* 12.7* 13.5 12.9*  HCT 35.6* 39.6 38.8* 39.8 39.4  PLT 163 170 179 202 184  MCV 79.5 81.5 80.5 80.6 80.7  MCH 25.9* 26.1 26.3 27.3 26.4  MCHC 32.6 32.1 32.7 33.9 32.7  RDW 16.2* 16.7* 16.7* 16.7* 16.6*  LYMPHSABS -- -- -- -- --  MONOABS -- -- -- -- --  EOSABS -- -- -- -- --  BASOSABS -- -- -- -- --  BANDABS -- -- -- -- --     CMP    Lab 10/01/12 0502 09/30/12 0500 09/29/12 0436 09/28/12 1520 09/26/12 0455 09/25/12 0438  NA 132* 135 135 138 136 --  K 4.2 4.1 3.9 4.2 4.3 --  CL 93* 93* 93* 95* 101 --  CO2 30 33* 34* 32 26 --  GLUCOSE 78 76 95 90 88 --  BUN 46* 44* 45* 41* 30* --  CREATININE 1.78* 1.61* 1.59* 1.42* 1.49* --  CALCIUM 8.9 9.3 9.0 9.6 9.1 --  MG -- -- -- -- -- 1.6  AST -- -- -- -- 17 --  ALT -- -- -- -- 15 --  ALKPHOS -- -- -- -- 104 --  BILITOT -- -- -- -- 1.1 --        Component Value Date/Time   BILITOT 1.1 09/26/2012 0455      Lab 10/01/12 0502 09/30/12 0500 09/29/12 0436 09/28/12 1520 09/27/12 0500  INR 1.89* 1.87* 1.94* 1.87* 1.72*  PROTIME -- -- -- -- --       Imaging Studies:  Ct Head Wo Contrast  10/01/2012  Comparison: Neck CT 09/26/2012.  Findings: Visualized paranasal sinuses and mastoids are clear. Visualized orbit soft tissues are within normal limits.  No acute scalp soft tissue findings.  Calcified atherosclerosis at the skull base.  No ventriculomegaly. No intraventricular hemorrhage. No midline shift, mass effect, or evidence of mass lesion.  Scattered subcortical white matter hypodensity, mild for age.  Asymmetric hyperdensity along the left sylvian fissure.  This might be cortically based rather than being in the subarachnoid space. There is no subarachnoid or intracranial hemorrhage identified elsewhere.  Asymmetric hyperdensity associated with a left MCA branch on image 11 is stable from the prior.  No other suspicious intracranial vascular hyperdensity.  Basilar cisterns appear normal.  IMPRESSION: 1. Asymmetric hyperdensity along the left sylvian fissure of uncertain significance.  The appearance is atypical for a small volume of subarachnoid hemorrhage.  This might instead reflect a cortically based process, and it may be stable compared to the prior neck CT.  No associated mass effect. No other evidence of intracranial hemorrhage. The patient cannot have the brain MRI due to cardiac AICD. I think a repeat noncontrast head CT in 8-12 hours may be most helpful to evaluate for any change in the appearance of the left sylvian fissure. If the patient's condition or mental status deteriorates recommend stat repeat Head CT. 2.  Otherwise no acute intracranial abnormality.  Mild nonspecific white matter changes.  Case discussed by telephone with Nurse Hillery Aldo at the time of dictation. She advises that the patient has been on heparin/Lovenox for atrial fibrillation.  Furthermore, the retropharyngeal abnormality described on the comparison is favored by the clinical team to be hematoma following transesophageal echo.   Original Report  Authenticated By: Harley Hallmark, M.D.    Ct Soft Tissue Neck Wo Contrast  09/26/2012 .  Comparison:  09/21/2012  CT NECK  Findings:  Limited visualization of the intracranial contents does not show any acute finding.  There is atherosclerotic calcification.  There is pronounced prevertebral soft tissue swelling.  This is most consistent with inflammatory disease.  There is tonsillar enlargement left more than right that could be due to tonsillitis. There is no extraluminal air / gas.  Thyroid gland is normal. Submandibular glands are normal.  Parotid glands are normal.  No definable lymphadenopathy.  Ordinary cervical spondylosis is present.  IMPRESSION: Pronounced prevertebral soft tissue swelling most consistent with inflammatory disease.  Tonsillar enlargement asymmetrically more prominent on the left, also probably inflammatory.  Detail is limited in the absence of contrast administration.  I do not see a low density collection to suggest frank abscess, but sensitivity is limited without contrast.  CT CHEST  Findings: There is no evidence of acute mediastinal pathology.  No extraluminal gas.  There has been previous median sternotomy. Pacemaker is in place.  The heart is enlarged.  Chronic pericardial calcification again noted.  There is less pleural fluid when compared to the previous study.  Patchy pulmonary opacities in the lower lobes not seen previously could represent developing pneumonia. No spinal lesion of significance.  IMPRESSION: No pneumomediastinum.  Less pleural fluid than seen previously.  Newly seen patchy density in both lung bases that could represent developing pneumonia.   Original Report Authenticated By: Thomasenia Sales, M.D.    Ct Chest Wo Contrast  10/4/2013CHEST  Findings: There is no evidence of acute mediastinal pathology.  No extraluminal gas.  There has been previous median sternotomy. Pacemaker is in place.  The heart is enlarged.  Chronic pericardial calcification again  noted.  There is less pleural fluid when compared to the previous study.  Patchy pulmonary opacities in the lower lobes not seen previously could represent developing pneumonia. No spinal lesion of significance.  IMPRESSION: No pneumomediastinum.  Less pleural fluid than seen previously.  Newly seen patchy density in both lung bases that could represent developing pneumonia.  Original Report Authenticated By: Thomasenia Sales, M.D.    Ct Angio Chest Pe W/cm &/or Wo Cm  09/21/2012   Comparison: Chest radiographs dated 09/21/2012.  Findings: No evidence of pulmonary embolism.  No frank interstitial edema.  However, there are small right and trace left pleural effusions. Trace fluid along the left major fissure.  Small amount fluid along the right major fissure, possibly mildly loculated, accounting for the radiographic abnormality.  Evaluation of the lung parenchyma is constrained by respiratory motion.  No suspicious pulmonary nodules.  No pneumothorax.  Cardiomegaly.  Pericardial calcifications.  Coronary atherosclerosis. Postsurgical changes related to prior CABG.  Right subclavian ICD.  Visualized upper abdomen is poorly evaluated due to motion but notable for possible left renal cysts.  Degenerative changes of the visualized thoracolumbar spine.  IMPRESSION: No evidence of pulmonary embolism.  Cardiomegaly with small right and trace left pleural effusions.  No frank interstitial edema.  Loculated fluid along the right major fissure, accounting for the radiographic abnormality.   Original Report Authenticated By: Charline Bills, M.D.      A/P: 66 y.o. male with multiple cardiac issues as well as a history of F VII deficiency diagnosed on 05/2011 requiring DDAVP And Cryporecipitate, admitted on 9/29 with acute on chronic CHF, A fib requiring anticoagulation, complicated by hematoma development, and seizure activity, with a head CT suspicious for small SAH. Currently, his Coumadin is on hold. We were  requested to see the patient with recommendations  Dr. Arbutus Ped  Is to proceed with an addendum to this note and  further workup studies.Will await for requested STAT Hematology  records from [facility] for review.   Thank you for the referral.  [facility] 10/01/2012 12:17 PM  Hematology Attending: Patient was interviewed and examined. He is not able to give an accurate history. At this time we have limited records available from [facility] which indicate he had some form of coagulopathy with the development of recurrent pocket hematoma at the site of an  implanted defibrillator when attempts were made to replace the generator.. One note says he had a factor VII deficiency. 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 He has a history of ventricular tachycardia. He had an implantable defibrillator placed in 2006. Battery was replaced on 03/27/2011 at. [facility] in [place]. He required admission to the hospital for management of hematoma. He was transferred to [facility]. The wound was opened and he was found to have an advanced pocket infection requiring multiple antibiotics. On 06/15/2011 wound was opened, debrided, and the old generator was removed. He was given a factor VII infusion as well as a unit of blood. A pectoralis flap was done. Cultures grew methicillin-resistant staph aureus. He developed recurrent hematoma requiring 2 subsequent evacuations on June 29 and 06/27/2011. On July 9, a new generator was placed. Preoperative preparation is not noted with respect to clotting factor administration.  He was admitted here on 09/21/2012 for further evaluation of acute dyspnea. He had recently relocated to [facility] in June. We have no baseline laboratory information prior to current admission. He had a CT angiogram on admission to rule out pulmonary embolus and no emboli were seen. He was started on anticoagulation with unfractionated heparin and subsequently  Coumadin was added. He was found to be in atrial fibrillation. Transesophageal echocardiogram showed an estimated ejection fraction of only 15-20%. He was felt to be a candidate for cardioversion. He was cardioverted on October 2. He developed  a number of subsequent complications. He developed a spontaneous, large, hematoma, in the antecubital region of his right arm. He developed pain and odynophagia initially felt to be tonsillitis but subsequently felt more likely to be a hematoma resulting from the minor trauma related to the transesophageal echocardiogram. He developed intermittent epistaxis. On October 8, he had a witnessed seizure. There was a previous history of stroke versus seizure and the patient is really not clear about this. Records available indicate previous stroke. Current CT scan of the brain does not show evidence for intracranial hemorrhage.  Anticoagulation was stopped in view of these complications, appropriately so. The first pro time recorded here was on October 2 and value was 18.4 seconds with lab normal up to 15.2. He was on heparin at that time but not Coumadin. Coumadin was started on October 6. We do not have a baseline PTT. He has normal liver functions. Initial platelet count 147,000 with hemoglobin 14.5, white count 4500, 42 neutrophils, 39 lymphocytes. Subsequent platelet counts up to 202,000 by October 6 but today down to 140,000.  Family history: He states his parents are both deceased. He has one younger sister living in [place]. He denies any personal or family history of a bleeding disorder. He has no recollection of being told he was factor VII deficiency was in [place].   Exam: I had to wake him up. He was sleepy and not cooperative. Speech is fluent. He is moving all extremities. The right pupil was 2 mm less than the left pupil area both reactive. Arcus senilis. Motor strength is 5 over 5 all extremities. Reflexes absent but symmetric. He could not open his  mouth all the way so I could not see the posterior pharynx to assess whether or not he had a peritonsillar hematoma. Lungs are clear. Implantable defibrillator subcutaneous tissues right subclavian position. Irregularly irregular cardiac rhythm. No murmur. Abdomen is soft nontender no mass no organomegaly. Extremities: Large hematoma with associated swelling of his right arm with the center of the hematoma around the antecubital fossa and proximal to this. No other ecchymoses.   Impression: Complex situation with an incomplete database. Factor VII deficiency is extremely rare. An acquired inhibitor to explain previous bleeding episodes makes more sense. In general, acquired inhibitors are against factor VIII and not factor VII. We don't have a baseline PTT which would be helpful and I have ordered this. His baseline pro time is hard to interpret since he was on heparin at the time it was drawn. It was only 3 seconds above control value. I would expect that it would be higher if he had a true factor VII deficiency. I'm going to obtain a factor VII level and a von Willebrand profile. It will probably take a number of days to get these results back. We have a new problem today with a approximate 50% fall in his platelet count compare with peak value on October 6.  Recommendation: I am Checking PT, PTT, repeat CBC with differential, factor VII level, von Willebrand profile which will also include a factor VIII activity. I would continue to hold anticoagulation at this time. I think we might be limited to using antiplatelet agents and having to avoid Coumadin or heparin products for thrombosis prophylaxis if we confirm a factor VII deficiency.  In the event of an emergency situation with respect to major bleeding, I would give him recombinant  factor VII 40 units per kilogram rapid IV infusion.  Thank you for this consultation. I'll  follow with you. Time spent 2 hours

## 2012-10-01 NOTE — Progress Notes (Signed)
Patient Name: Louis Ford      SUBJECTIVE:events ntoed.  I was not aware of prior hx of seizures, he had told me of stroke.   CT concernign for small ?SAH and repeat recommended  Neuro on board  Review of records from [facility] raise issue of Factor VII deficiency--might that be contributing to his hematoma and ? Raise risk of intercerebral bleed   Past Medical History  Diagnosis Date  . CAD (coronary artery disease)   . Chronic systolic heart failure   . Ischemic cardiomyopathy     EF 15 to 20% by TTE and TEE in 09/2012.  Severe LV dysfunction  . HTN (hypertension)   . Hyperlipidemia   . Obesity     BMI 31 in 09/2012  . Atrial fibrillation   09/22/2012  . implantable cardiac defibrillator-Biotronik     Device Implanted 2006 Reimplanted 2012 : Explanted  and Reimplanted  7/12  . Stroke   . 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    PHYSICAL EXAM Filed Vitals:   10/01/12 0700 10/01/12 0730 10/01/12 0752 10/01/12 1030  BP: 78/48 96/75  90/66  Pulse:      Temp:   98 F (36.7 C)   TempSrc:   Oral   Resp: 34 25  21  Height:      Weight:      SpO2: 100% 92%  100%    Well developed and nourished in no acute distress; sleepy but conversant and appropriate HENT normal Neck supple with JVP-flat Carotids brisk and full without bruits Clear Regular rate and rhythm,  Abd-soft with active BS without hepatomegaly No Clubbing cyanosis edema; swoollen right arm and PICC in Left Skin-warm and dry A & Oriented  Grossly normal sensory and motor function  TELEMETRY: Reviewed telemetry pt in NSR   Intake/Output Summary (Last 24 hours) at 10/01/12 1055 Last data filed at 10/01/12 0700  Gross per 24 hour  Intake    729 ml  Output    350 ml  Net    379 ml    LABS: Basic Metabolic Panel:  Lab 10/01/12 1610 09/30/12 0500 09/29/12 0436 09/28/12 1520 09/26/12 0455 09/25/12 0438  NA 132* 135 135 138 136 134*  K 4.2 4.1 3.9 4.2 4.3 4.3  CL 93* 93* 93* 95* 101 101  CO2 30 33* 34*  32 26 23  GLUCOSE 78 76 95 90 88 92  BUN 46* 44* 45* 41* 30* 31*  CREATININE 1.78* 1.61* 1.59* 1.42* 1.49* 1.48*  CALCIUM 8.9 9.3 -- -- -- --  MG -- -- -- -- -- 1.6  PHOS -- -- -- -- -- --   Cardiac Enzymes: No results found for this basename: CKTOTAL:3,CKMB:3,CKMBINDEX:3,TROPONINI:3 in the last 72 hours CBC:  Lab 10/01/12 0502 09/30/12 0500 09/29/12 0436 09/28/12 1520 09/27/12 0500 09/26/12 0455 09/25/12 0438  WBC 6.7 4.8 7.9 11.0* 4.3 4.6 3.9*  NEUTROABS -- -- -- -- -- -- --  HGB 11.6* 12.7* 12.7* 13.5 12.9* 12.7* 12.3*  HCT 35.6* 39.6 38.8* 39.8 39.4 38.6* 37.3*  MCV 79.5 81.5 80.5 80.6 80.7 80.4 80.6  PLT 163 170 179 202 184 171 159   PROTIME:  Basename 10/01/12 0502 09/30/12 0500 09/29/12 0436  LABPROT 21.0* 20.8* 21.4*  INR 1.89* 1.87* 1.94*      ASSESSMENT AND PLAN:  Patient Active Ford Problem List: Atrial fibrillation   (09/22/2012) s/p cardioversion    CAD (coronary artery disease) s/p cabg (09/21/2012)   Acute on chronic  systolic heart failure (09/25/2012) euvolemic   Odynophagia (09/26/2012) improving   Ischemic cardiomyopathy  ? additional rate component (09/29/2012)   Seizure (10/01/2012) ? New or old  Neuro following  Repeat CT ordered and will make NPO until neuro weighs in   Hypotension-- with CVA will hold ARB and BB  Renal insufficiency  ?FActor VII deficiency    HOld diuretics; May need SCDs  Right now will have to take risk of subtherapeutic anticoagulation given ? Intercerebral bleed and have also asked heme to assist  Signed, Sherryl Manges MD  10/01/2012

## 2012-10-01 NOTE — Progress Notes (Signed)
Spoke to Dr. Arbutus Ped with hematology. There is a question of factor VII deficiency history. To verify this, records are being obtained from [place]. In the meantime, no anticoagulation is recommended. The patient is scheduled for a repeat noncontrast head CT tonight to further evaluate intracranial process potentially representing small SAH on initial head CT. Discussed with Dr. Graciela Husbands, will discontinue Lovenox and Coumadin. Re-initiation of anticoagulation when/if appropriate will need to be readdressed.   Jacqulyn Bath, PA-C 10/01/2012 7:17 PM

## 2012-10-01 NOTE — Progress Notes (Signed)
PT Cancellation Note  Patient Details Name: Louis Ford MRN: [id number] DOB: Jan 25, 1946   Cancelled Treatment:     Medical issues which prohibited therapy.  Pt with seizure last night & RN reports pt still confused at this time & requests to hold therapy for today.    Verdell Face, [place] [phone] 10/01/2012

## 2012-10-01 NOTE — Procedures (Signed)
EEG NUMBER:  REFERRING PHYSICIAN:  Dr. Micah Noel.  HISTORY:  A 66 year old male with episode of unresponsiveness and jerking movements.  MEDICATIONS:  Carvedilol, clindamycin, Lovenox, flumazenil, furosemide, losartan, potassium, simvastatin, warfarin.  CONDITIONS OF RECORDING:  This is a 16-channel EEG carried out with the patient in the poorly responsive state.  DESCRIPTION:  The background activity is dominated by well-organized fast frequencies that consist mostly of beta activity.  At times, the posterior background rhythm can be discerned at which times alpha rhythms are noted.  This is poorly sustained.  No sleep transients were noted. Hyperventilation was not performed.  Intermittent photic stimulation failed to elicit any change in the tracing.  IMPRESSION:  This EEG was dominated by medication effect that was likely secondary to administration of Ativan.  No epileptiform transients were noted.  No evidence of continued seizure activity was noted.          ______________________________ Thana Farr, MD    [id number] D:  10/01/2012 17:54:35  T:  10/01/2012 19:01:00  Job #:  [id number]

## 2012-10-01 NOTE — Progress Notes (Signed)
Subjective: Pt tolerated po intake yesterday.  Dysphagia significantly improved. Sz episode last night noted. Pt very sleepy this morning.  Objective: Vital signs in last 24 hours: Temp:  [97.4 F (36.3 C)-98.5 F (36.9 C)] 98 F (36.7 C) (10/09 0752) Pulse Rate:  [90-91] 90  (10/08 1717) Resp:  [14-34] 25  (10/09 0730) BP: (69-145)/(45-77) 96/75 mmHg (10/09 0730) SpO2:  [92 %-100 %] 92 % (10/09 0730) Weight:  [97.1 kg (214 lb 1.1 oz)] 97.1 kg (214 lb 1.1 oz) (10/09 0500)  Physical Exam:  General: Pleasant male in NAD. A&O x 3.  Head: No facial asymmetry or swelling  Eyes: No conj pallor or icterus. His pupils are equal, round, reactive to light. Extraocular motion is intact.  ENT: Examination of the ears shows normal auricles and external auditory canals bilaterally. Nasal examination shows congested mucosa, septum, turbinates. No acute bleeding. Facial examination shows no asymmetry. Palpation of the face elicit no significant tenderness. Oral cavity examination shows mild edema and erythema or the left tonsils. Palpation of the neck reveals no lymphadenopathy or mass. The trachea is midline. The thyroid is not significantly enlarged.    Basename 10/01/12 0502 09/30/12 0500  WBC 6.7 4.8  HGB 11.6* 12.7*  HCT 35.6* 39.6  PLT 163 170    Basename 10/01/12 0502 09/30/12 0500  NA 132* 135  K 4.2 4.1  CL 93* 93*  CO2 30 33*  GLUCOSE 78 76  BUN 46* 44*  CREATININE 1.78* 1.61*  CALCIUM 8.9 9.3    Medications:  I have reviewed the patient's current medications. Scheduled:   . antiseptic oral rinse  15 mL Mouth Rinse BID  . carvedilol  3.125 mg Oral BID WC  . clindamycin (CLEOCIN) IV  600 mg Intravenous Q8H  . enoxaparin (LOVENOX) injection  1 mg/kg Subcutaneous Q12H  . flumazenil  0.5 mg Intravenous Once  . furosemide  40 mg Intravenous BID  . levetiracetam  1,000 mg Intravenous Once  . levetiracetam  500 mg Intravenous Q12H  . LORazepam  2 mg Intravenous Once  .  losartan  25 mg Oral Daily  . potassium chloride  10 mEq Intravenous Q1 Hr x 2  . potassium chloride  20 mEq Oral BID  . simvastatin  40 mg Oral q1800  . sodium chloride  10-40 mL Intracatheter Q12H  . sodium chloride  3 mL Intravenous Q12H  . warfarin  4 mg Oral ONCE-1800  . Warfarin - Pharmacist Dosing Inpatient   Does not apply q1800   NFA:OZHYQM chloride, acetaminophen, ALPRAZolam, guaiFENesin, LORazepam, morphine injection, ondansetron (ZOFRAN) IV, phenol, sodium chloride, sodium chloride  Assessment/Plan: The patient's left tonsillitis and peritonsillar cellulitis have significantly improved. - Continue IV clindamycin.  Consider switching to oral abx when he is ready for discharge.  Would recommend 10 more days of abx coverage. - Regular diet.     LOS: 10 days   Sophina Mitten,SUI W 10/01/2012, 8:27 AM

## 2012-10-02 LAB — CBC
HCT: 34.8 % — ABNORMAL LOW (ref 39.0–52.0)
MCHC: 32.5 g/dL (ref 30.0–36.0)
MCV: 79.8 fL (ref 78.0–100.0)
RDW: 16.4 % — ABNORMAL HIGH (ref 11.5–15.5)
WBC: 5.6 10*3/uL (ref 4.0–10.5)

## 2012-10-02 LAB — BASIC METABOLIC PANEL
BUN: 39 mg/dL — ABNORMAL HIGH (ref 6–23)
Chloride: 95 mEq/L — ABNORMAL LOW (ref 96–112)
Creatinine, Ser: 1.45 mg/dL — ABNORMAL HIGH (ref 0.50–1.35)
GFR calc Af Amer: 58 mL/min — ABNORMAL LOW (ref >90)
Glucose, Bld: 69 mg/dL — ABNORMAL LOW (ref 70–99)

## 2012-10-02 LAB — CBC WITH DIFFERENTIAL/PLATELET
Eosinophils Absolute: 0.2 10*3/uL (ref 0.0–0.7)
Eosinophils Relative: 5 % (ref 0–5)
HCT: 34.9 % — ABNORMAL LOW (ref 39.0–52.0)
Hemoglobin: 11.4 g/dL — ABNORMAL LOW (ref 13.0–17.0)
Lymphs Abs: 1.4 10*3/uL (ref 0.7–4.0)
MCH: 26.3 pg (ref 26.0–34.0)
MCV: 80.4 fL (ref 78.0–100.0)
Monocytes Absolute: 0.8 10*3/uL (ref 0.1–1.0)
Monocytes Relative: 15 % — ABNORMAL HIGH (ref 3–12)
Platelets: 167 10*3/uL (ref 150–400)
RBC: 4.34 MIL/uL (ref 4.22–5.81)

## 2012-10-02 LAB — SAVE SMEAR

## 2012-10-02 LAB — APTT: aPTT: 52 seconds — ABNORMAL HIGH (ref 24–37)

## 2012-10-02 MED ORDER — METOPROLOL TARTRATE 12.5 MG HALF TABLET
12.5000 mg | ORAL_TABLET | Freq: Two times a day (BID) | ORAL | Status: DC
  Administered 2012-10-02 – 2012-10-06 (×8): 12.5 mg via ORAL
  Filled 2012-10-02 (×9): qty 1

## 2012-10-02 NOTE — Progress Notes (Signed)
Subjective: Patient out of bed in chair. Much improved today.  EEG showed medication effect.  Repeat head CT showed no change but hemorrhage less likely at this point.    Objective: Current vital signs: BP 104/52  Pulse 77  Temp 97.4 F (36.3 C) (Oral)  Resp 18  Ht 5\' 11"  (1.803 m)  Wt 94.8 kg (208 lb 15.9 oz)  BMI 29.15 kg/m2  SpO2 100% Vital signs in last 24 hours: Temp:  [97.4 F (36.3 C)-98.2 F (36.8 C)] 97.4 F (36.3 C) (10/10 1238) Pulse Rate:  [77-86] 77  (10/10 1238) Resp:  [17-20] 18  (10/10 1200) BP: (88-113)/(52-82) 104/52 mmHg (10/10 1228) SpO2:  [100 %] 100 % (10/10 1238) Weight:  [94.8 kg (208 lb 15.9 oz)] 94.8 kg (208 lb 15.9 oz) (10/10 0500)  Intake/Output from previous day: 10/09 0701 - 10/10 0700 In: 760 [P.O.:440; I.V.:60; IV Piggyback:260] Out: 1390 [Urine:1390] Intake/Output this shift: Total I/O In: 410 [P.O.:360; IV Piggyback:50] Out: 600 [Urine:600] Nutritional status: Cardiac  Neurologic Exam: Mental Status: Alert and follows commands.  Speech fluent without evidence of aphasia.   Cranial Nerves: II: Discs flat bilaterally; Visual fields grossly normal, pupils equal, round, reactive to light and accommodation III,IV, VI: ptosis not present, extra-ocular motions intact bilaterally V,VII: smile symmetric, facial light touch sensation normal bilaterally VIII: hearing normal bilaterally IX,X: gag reflex present XI: bilateral shoulder shrug XII: midline tongue extension Motor: Moving all extremities equally Sensory: Pinprick and light touch intact throughout, bilaterally Deep Tendon Reflexes: 2+ and symmetric throughout Plantars: Right: downgoing   Left: downgoing Cerebellar: normal finger-to-nose, normal rapid alternating movements and normal heel-to-shin test CV: pulses palpable throughout     Lab Results: Basic Metabolic Panel:  Lab 2012/10/10 4098 10/01/12 0502 09/30/12 0500 09/29/12 0436 09/28/12 1520  NA 130* 132* 135 135 138  K  4.6 4.2 4.1 3.9 4.2  CL 95* 93* 93* 93* 95*  CO2 26 30 33* 34* 32  GLUCOSE 69* 78 76 95 90  BUN 39* 46* 44* 45* 41*  CREATININE 1.45* 1.78* 1.61* 1.59* 1.42*  CALCIUM 8.8 8.9 9.3 -- --  MG -- -- -- -- --  PHOS -- -- -- -- --    Liver Function Tests:  Lab 09/26/12 0455  AST 17  ALT 15  ALKPHOS 104  BILITOT 1.1  PROT 7.4  ALBUMIN 3.4*   No results found for this basename: LIPASE:5,AMYLASE:5 in the last 168 hours No results found for this basename: AMMONIA:3 in the last 168 hours  CBC:  Lab October 10, 2012 0722 2012-10-10 0500 10/01/12 0502 09/30/12 0500 09/29/12 0436  WBC 5.4 5.6 6.7 4.8 7.9  NEUTROABS 2.9 -- -- -- --  HGB 11.4* 11.3* 11.6* 12.7* 12.7*  HCT 34.9* 34.8* 35.6* 39.6 38.8*  MCV 80.4 79.8 79.5 81.5 80.5  PLT 167 140* 163 170 179    Cardiac Enzymes: No results found for this basename: CKTOTAL:5,CKMB:5,CKMBINDEX:5,TROPONINI:5 in the last 168 hours  Lipid Panel: No results found for this basename: CHOL:5,TRIG:5,HDL:5,CHOLHDL:5,VLDL:5,LDLCALC:5 in the last 168 hours  CBG: No results found for this basename: GLUCAP:5 in the last 168 hours  Microbiology: Results for orders placed during the hospital encounter of 09/21/12  MRSA PCR SCREENING     Status: Normal   Collection Time   09/22/12  4:55 PM      Component Value Range Status Comment   MRSA by PCR NEGATIVE  NEGATIVE Final     Coagulation Studies:  Basename 10-Oct-2012 0722 10-Oct-2012 0500 10/01/12 0502 09/30/12 0500  LABPROT 19.5* 19.7* 21.0* 20.8*  INR 1.71* 1.73* 1.89* 1.87*    Imaging: Ct Head Wo Contrast  10/01/2012  *RADIOLOGY REPORT*  Clinical Data: 66 year old male altered mental status and seizure.  CT HEAD WITHOUT CONTRAST  Technique:  Contiguous axial images were obtained from the base of the skull through the vertex without contrast.  Comparison: 0047 hours the same day.  Findings: Stable paranasal sinuses and mastoids. Stable visualized osseous structures.  No scalp hematoma identified.  Calcified  atherosclerosis at the skull base.  Asymmetry of the left sylvian fissure persists and is unchanged. The finding consists of both cortically based hyperdensity as well as asymmetric volume loss compared to the opposite side.  A sub insular hypodensity also noted.  Stable left MCA M2 branch which occurs in this region.  As before, I think the findings probably are unchanged since 09/26/2012.  No convincing intracranial hemorrhage identified.  No ventriculomegaly.  Stable gray-white matter differentiation elsewhere.  No acute cortically based infarct identified.  IMPRESSION: 1.  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  In light of the patient's ongoing anticoagulation for atrial fibrillation, I recommend one additional follow-up noncontrast head CT to ensure stability in 48 hours, unless clinically indicated sooner. 2.  No new intracranial findings.   Original Report Authenticated By: Harley Hallmark, M.D.    Ct Head Wo Contrast  10/01/2012  *RADIOLOGY REPORT*  Clinical Data: 66 year old male new onset seizure. Possible history of seizure disorder.  Altered mental status.  CT HEAD WITHOUT CONTRAST  Technique:  Contiguous axial images were obtained from the base of the skull through the vertex without contrast.  Comparison: Neck CT 09/26/2012.  Findings: Visualized paranasal sinuses and mastoids are clear. Visualized orbit soft tissues are within normal limits.  No acute scalp soft tissue findings.  Calcified atherosclerosis at the skull base.  No ventriculomegaly. No intraventricular hemorrhage. No midline shift, mass effect, or evidence of mass lesion.  Scattered subcortical white matter hypodensity, mild for age.  Asymmetric hyperdensity along the left sylvian fissure.  This might be cortically based rather than being in the subarachnoid space. There is no subarachnoid or intracranial hemorrhage identified elsewhere.  Asymmetric  hyperdensity associated with a left MCA branch on image 11 is stable from the prior.  No other suspicious intracranial vascular hyperdensity.  Basilar cisterns appear normal.  IMPRESSION: 1. Asymmetric hyperdensity along the left sylvian fissure of uncertain significance.  The appearance is atypical for a small volume of subarachnoid hemorrhage.  This might instead reflect a cortically based process, and it may be stable compared to the prior neck CT.  No associated mass effect. No other evidence of intracranial hemorrhage. The patient cannot have the brain MRI due to cardiac AICD. I think a repeat noncontrast head CT in 8-12 hours may be most helpful to evaluate for any change in the appearance of the left sylvian fissure. If the patient's condition or mental status deteriorates recommend stat repeat Head CT. 2.  Otherwise no acute intracranial abnormality.  Mild nonspecific white matter changes.  Case discussed by telephone with Nurse Hillery Aldo at the time of dictation. She advises that the patient has been on heparin/Lovenox for atrial fibrillation.  Furthermore, the retropharyngeal abnormality described on the comparison is favored by the clinical team to be hematoma following transesophageal echo.   Original Report Authenticated By: Harley Hallmark, M.D.     Medications:  I have reviewed the patient's current  medications. Scheduled:   . antiseptic oral rinse  15 mL Mouth Rinse BID  . clindamycin (CLEOCIN) IV  600 mg Intravenous Q8H  . flumazenil  0.5 mg Intravenous Once  . levetiracetam  500 mg Intravenous Q12H  . LORazepam  2 mg Intravenous Once  . potassium chloride  20 mEq Oral BID  . simvastatin  40 mg Oral q1800  . sodium chloride  10-40 mL Intracatheter Q12H  . sodium chloride  3 mL Intravenous Q12H  . DISCONTD: enoxaparin (LOVENOX) injection  1 mg/kg Subcutaneous Q12H  . DISCONTD: Warfarin - Pharmacist Dosing Inpatient   Does not apply q1800    Assessment/Plan:  Patient Active  Hospital Problem List: Abnormal CT   Assessment:  No changes seen on imaging-hemorrhage unlikely.     Plan:  Repeat head CT in 2 days as recommended by radiology Seizure (10/01/2012)   Assessment: No further seizures noted.  Patient's mental status improved.  Reports that he was on seizure medication a long time ago but has not taken it recently.  Can not remember what was prescribed.     Plan:  1.  Continue Keppra at current dose    LOS: 11 days   Thana Farr, MD [facility] [phone] 10/02/2012  3:02 PM

## 2012-10-02 NOTE — Progress Notes (Signed)
PT Cancellation Note  Patient Details Name: Louis Ford MRN: [id number] DOB: [birth year]   Cancelled Treatment:     Pt refusing PT session at this time. Will attempt back later today if time allows.      Verdell Face, [place] [phone] 10/02/2012

## 2012-10-02 NOTE — Progress Notes (Signed)
Subjective: Patient tired.  No CP  No SOB. Objective: Filed Vitals:   10/02/12 0821 10/02/12 1200 10/02/12 1228 10/02/12 1238  BP:  104/52 104/52   Pulse: 77   77  Temp: 97.6 F (36.4 C)   97.4 F (36.3 C)  TempSrc: Oral   Oral  Resp:  18    Height:      Weight:      SpO2:  100%  100%   Weight change: -5 lb 1.1 oz (-2.3 kg)  Intake/Output Summary (Last 24 hours) at 10/02/12 1421 Last data filed at 10/02/12 1000  Gross per 24 hour  Intake   1170 ml  Output   1640 ml  Net   -470 ml    General: Alert, in no acute distress Neck:  JVP is normal Heart: Regular rate and rhythm, without murmurs, rubs, gallops.  Lungs: Clear to auscultation.  No rales or wheezes. Exemities:  No edema.   Neuro: Grossly intact, nonfocal.  Tele:  Lab Results: Results for orders placed during the hospital encounter of 09/21/12 (from the past 24 hour(s))  CBC     Status: Abnormal   Collection Time   10/02/12  5:00 AM      Component Value Range   WBC 5.6  4.0 - 10.5 K/uL   RBC 4.36  4.22 - 5.81 MIL/uL   Hemoglobin 11.3 (*) 13.0 - 17.0 g/dL   HCT 16.1 (*) 09.6 - 04.5 %   MCV 79.8  78.0 - 100.0 fL   MCH 25.9 (*) 26.0 - 34.0 pg   MCHC 32.5  30.0 - 36.0 g/dL   RDW 40.9 (*) 81.1 - 91.4 %   Platelets 140 (*) 150 - 400 K/uL  BASIC METABOLIC PANEL     Status: Abnormal   Collection Time   10/02/12  5:00 AM      Component Value Range   Sodium 130 (*) 135 - 145 mEq/L   Potassium 4.6  3.5 - 5.1 mEq/L   Chloride 95 (*) 96 - 112 mEq/L   CO2 26  19 - 32 mEq/L   Glucose, Bld 69 (*) 70 - 99 mg/dL   BUN 39 (*) 6 - 23 mg/dL   Creatinine, Ser 7.82 (*) 0.50 - 1.35 mg/dL   Calcium 8.8  8.4 - 95.6 mg/dL   GFR calc non Af Amer 50 (*) >90 mL/min   GFR calc Af Amer 58 (*) >90 mL/min  PROTIME-INR     Status: Abnormal   Collection Time   10/02/12  5:00 AM      Component Value Range   Prothrombin Time 19.7 (*) 11.6 - 15.2 seconds   INR 1.73 (*) 0.00 - 1.49  CBC WITH DIFFERENTIAL     Status: Abnormal   Collection Time   10/02/12  7:22 AM      Component Value Range   WBC 5.4  4.0 - 10.5 K/uL   RBC 4.34  4.22 - 5.81 MIL/uL   Hemoglobin 11.4 (*) 13.0 - 17.0 g/dL   HCT 21.3 (*) 08.6 - 57.8 %   MCV 80.4  78.0 - 100.0 fL   MCH 26.3  26.0 - 34.0 pg   MCHC 32.7  30.0 - 36.0 g/dL   RDW 46.9 (*) 62.9 - 52.8 %   Platelets 167  150 - 400 K/uL   Neutrophils Relative 55  43 - 77 %   Neutro Abs 2.9  1.7 - 7.7 K/uL   Lymphocytes Relative 26  12 - 46 %  Lymphs Abs 1.4  0.7 - 4.0 K/uL   Monocytes Relative 15 (*) 3 - 12 %   Monocytes Absolute 0.8  0.1 - 1.0 K/uL   Eosinophils Relative 5  0 - 5 %   Eosinophils Absolute 0.2  0.0 - 0.7 K/uL   Basophils Relative 0  0 - 1 %   Basophils Absolute 0.0  0.0 - 0.1 K/uL  PROTIME-INR     Status: Abnormal   Collection Time   10/02/12  7:22 AM      Component Value Range   Prothrombin Time 19.5 (*) 11.6 - 15.2 seconds   INR 1.71 (*) 0.00 - 1.49  APTT     Status: Abnormal   Collection Time   10/02/12  7:22 AM      Component Value Range   aPTT 52 (*) 24 - 37 seconds  SAVE SMEAR     Status: Normal   Collection Time   10/02/12  7:22 AM      Component Value Range   Smear Review SMEAR STAINED AND AVAILABLE FOR REVIEW      Studies/Results: @RISRSLT24 @  Medications: Reviewed.   Patient Active Hospital Problem List: Atrial fibrillation   (09/22/2012)   Assessment: s/;p Cardioversion.  Remains in SR  Anticoag on hold with hematoma.       hypertension (09/21/2012)   Assessment:  Adequate.  CAD (coronary artery disease) s/p cabg [place]   Assessment:  No symptoms of angina.  Acute on chronic systolic heart failure (09/25/2012)   Assessment: Volume status looks good.  Will need reassessment of LVEF in a few months to see if improves in SR. Not on any meds for CHF  Will add very low dose b blocker and follow BP    Seizure:  Assessment: EEG yesterday.  CT yesterday afternoon with L sylvian fissure asymmetry that was stable.  F/U in 48 hours  recommended.  Heme:  Appreciate input.  Anticoag on hold  Awaiting lab results. Renal insufficiency (10/01/2012)   Assessment: Improved.      LOS: 11 days   Dietrich Pates 10/02/2012, 2:21 PM

## 2012-10-02 NOTE — Progress Notes (Signed)
CARDIAC REHAB PHASE I   PRE:  Rate/Rhythm: 88SRPACs  BP:  Supine:   Sitting: 98/59  Standing:    SaO2: 100%2L,100%RA  MODE:  Ambulation: 390 ft   POST:  Rate/Rhythem: 133 afib short run, 94SR PACS PVCS  BP:  Supine:   Sitting: 100/64  Standing:    SaO2: 100%RA 1404-1440 Pt willing to walk now. Ambulated 390 ft on RA with rolling walker and asst x 2 with use of gait belt. Pt tolerated well. Has short run of atrial fib upon return to room. No complaints. To recliner with call bell. Encouraged pt to get up by himself.  Duanne Limerick

## 2012-10-03 ENCOUNTER — Encounter (HOSPITAL_COMMUNITY): Payer: Self-pay | Admitting: Internal Medicine

## 2012-10-03 DIAGNOSIS — D689 Coagulation defect, unspecified: Secondary | ICD-10-CM | POA: Diagnosis present

## 2012-10-03 LAB — BASIC METABOLIC PANEL
Chloride: 99 mEq/L (ref 96–112)
GFR calc Af Amer: 76 mL/min — ABNORMAL LOW (ref >90)
GFR calc non Af Amer: 66 mL/min — ABNORMAL LOW (ref >90)
Potassium: 4.1 mEq/L (ref 3.5–5.1)

## 2012-10-03 LAB — CBC
MCHC: 32.9 g/dL (ref 30.0–36.0)
Platelets: 167 10*3/uL (ref 150–400)
RDW: 16.6 % — ABNORMAL HIGH (ref 11.5–15.5)
WBC: 7.8 10*3/uL (ref 4.0–10.5)

## 2012-10-03 LAB — VON WILLEBRAND PANEL
Coagulation Factor VIII: 276 % — ABNORMAL HIGH (ref 73–140)
Ristocetin Co-factor, Plasma: 166 % (ref 42–200)
Von Willebrand Antigen, Plasma: 192 % (ref 50–217)

## 2012-10-03 LAB — PROTIME-INR: Prothrombin Time: 17.2 seconds — ABNORMAL HIGH (ref 11.6–15.2)

## 2012-10-03 NOTE — Progress Notes (Signed)
Both PT & PTT elevated on 10/10 Last dose of lovenox given 17:18 on 10/8 Last dose of coumadin 10/8 @ 17:19 Suggests problem in common coag cascade pathway - factors V, X, II This pattern most common with a non specific lupus anticoagulant type inhibitor I will order additional studies. No active bleeding at present; repeat CT head negative for bleed Hb stable @ 11 grams Transient dip in platelet count to 140,000; today 167,000 Impression: He does appear to have an underlying coagulopathy Studies in progress to better define specific vs non-specific inhibitor to coagulation. Recommend: Use antiplatelet agents ASA +/- plavix if needed for thromboprophylaxis

## 2012-10-03 NOTE — Progress Notes (Signed)
Physical Therapy Treatment Patient Details Name: Louis Ford MRN: [id number] DOB: [birth year] Today's Date: 10/03/2012 Time: 2130-8657 PT Time Calculation (min): 25 min  PT Assessment / Plan / Recommendation Comments on Treatment Session  Pt initially resistant to therapy session but ultimately agreeable to participate.  Ambulated 2900 unit 2x's pushing IV pole with bil UEs.  Due to pt feeling the need for UE support, encouraged use of RW but pt deferring at this time however, pt did state that he would be open to using RW if still needed next session.      Follow Up Recommendations  Home health PT;Supervision/Assistance - 24 hour     Does the patient have the potential to tolerate intense rehabilitation     Barriers to Discharge        Equipment Recommendations  Rolling walker with 5" wheels    Recommendations for Other Services    Frequency Min 3X/week   Plan Discharge plan remains appropriate;Frequency remains appropriate    Precautions / Restrictions Restrictions Weight Bearing Restrictions: No    Pertinent Vitals/Pain C/o Rt LE pain upon arrival, but reports pain decreasing with activity.     Mobility  Bed Mobility Bed Mobility: Not assessed Transfers Transfers: Sit to Stand;Stand to Sit Sit to Stand: With upper extremity assist;With armrests;From chair/3-in-1;4: Min guard Stand to Sit: With upper extremity assist;With armrests;To chair/3-in-1;4: Min guard Details for Transfer Assistance: Pt a little slower to achieve standing today than last PT session. Guarding to ensure balance.   Ambulation/Gait Ambulation/Gait Assistance: 4: Min guard Ambulation Distance (Feet): 800 Feet (entire 2900 unit x 2.  ) Assistive device: Other (Comment) (IV pole) Ambulation/Gait Assistance Details: Pt pushing IV pole with Bil UE's but deferring use of RW despite encouragement.  Pt with slow, steady gait.  Cues for tall posture.   Gait Pattern: Step-through pattern;Decreased stride  length;Trunk flexed Gait velocity: decr Stairs: No Wheelchair Mobility Wheelchair Mobility: No     PT Goals Acute Rehab PT Goals Time For Goal Achievement: 09/30/12 Potential to Achieve Goals: Good Pt will go Sit to Stand: Independently PT Goal: Sit to Stand - Progress: Not met Pt will Ambulate: 51 - 150 feet;with modified independence;with least restrictive assistive device PT Goal: Ambulate - Progress: Progressing toward goal Pt will Go Up / Down Stairs: 1-2 stairs;with supervision;with least restrictive assistive device  Visit Information  Last PT Received On: 10/03/12 Assistance Needed: +1    Subjective Data  Patient Stated Goal: To go home   Cognition  Overall Cognitive Status: Appears within functional limits for tasks assessed/performed Arousal/Alertness: Awake/alert Orientation Level: Appears intact for tasks assessed Behavior During Session: [facility] for tasks performed    Balance     End of Session PT - End of Session Equipment Utilized During Treatment: Gait belt Activity Tolerance: Patient tolerated treatment well Patient left: in chair;with call bell/phone within reach Nurse Communication: Mobility status    Verdell Face, [place] [phone] 10/03/2012

## 2012-10-03 NOTE — Progress Notes (Signed)
Patient Name: [facility]      SUBJECTIVE: more alert and conversant.  Arm still sore,  Eating regular diet with some odynophagia but improving  Past Medical History  Diagnosis Date  . CAD (coronary artery disease)   . Chronic systolic heart failure   . Ischemic cardiomyopathy     EF 15 to 20% by TTE and TEE in 09/2012.  Severe LV dysfunction  . HTN (hypertension)   . Hyperlipidemia   . Obesity     BMI 31 in 09/2012  . Atrial fibrillation   09/22/2012  . implantable cardiac defibrillator-Biotronik     Device Implanted 2006 Reimplanted 2012 : Explanted  and Reimplanted  7/12  . Stroke   . 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 History of MRSA infection 05/2011  . Seizure disorder latest 09/30/2012    PHYSICAL EXAM Filed Vitals:   10/03/12 0000 10/03/12 0400 10/03/12 0402 10/03/12 0730  BP: 93/55 118/61  130/79  Pulse:    69  Temp:   98.2 F (36.8 C) 97.5 F (36.4 C)  TempSrc:   Oral Oral  Resp: 18 16  18   Height:      Weight:      SpO2: 100% 97%  98%    Well developed and nourished in no acute distress HENT normal Neck supple with JVP-flat Carotids brisk and full without bruits Clear Regular rate and rhythm, m2/6 murmur  Abd-soft with active BS without hepatomegaly No Clubbing cyanosis edema Skin-warm and dry A & Oriented  Grossly normal sensory and motor function  TELEMETRY: Reviewed telemetry pt in * nsr    Intake/Output Summary (Last 24 hours) at 10/03/12 0822 Last data filed at 10/03/12 0602  Gross per 24 hour  Intake   1150 ml  Output   1420 ml  Net   -270 ml    LABS: Basic Metabolic Panel:  Lab 10/03/12 1610 10/02/12 0500 10/01/12 0502 09/30/12 0500 09/29/12 0436 09/28/12 1520  NA 132* 130* 132* 135 135 138  K 4.1 4.6 4.2 4.1 3.9 4.2  CL 99 95* 93* 93* 93* 95*  CO2 25 26 30  33* 34* 32  GLUCOSE 135* 69* 78 76 95 90  BUN 25* 39* 46* 44* 45* 41*  CREATININE 1.15 1.45* 1.78* 1.61* 1.59* 1.42*  CALCIUM 8.8 8.8 -- -- -- --  MG --  -- -- -- -- --  PHOS -- -- -- -- -- --   Cardiac Enzymes: No results found for this basename: CKTOTAL:3,CKMB:3,CKMBINDEX:3,TROPONINI:3 in the last 72 hours CBC:  Lab 10/03/12 0420 10/02/12 0722 10/02/12 0500 10/01/12 0502 09/30/12 0500 09/29/12 0436 09/28/12 1520  WBC 7.8 5.4 5.6 6.7 4.8 7.9 11.0*  NEUTROABS -- 2.9 -- -- -- -- --  HGB 11.1* 11.4* 11.3* 11.6* 12.7* 12.7* 13.5  HCT 33.7* 34.9* 34.8* 35.6* 39.6 38.8* 39.8  MCV 80.2 80.4 79.8 79.5 81.5 80.5 80.6  PLT 167 167 140* 163 170 179 202   PROTIME:  Basename 10/03/12 0420 10/02/12 0722 10/02/12 0500  LABPROT 17.2* 19.5* 19.7*  INR 1.44 1.71* 1.73*   Liver Function Tests: No results found for this basename: AST:2,ALT:2,ALKPHOS:2,BILITOT:2,PROT:2,ALBUMIN:2 in the last 72 hours No results found for this basename: LIPASE:2,AMYLASE:2 in the last 72 hours BNP: BNP (last 3 results)  Basename 09/22/12 0328 09/21/12 0515  PROBNP 3157.0* 2267.0*     ASSESSMENT AND PLAN:  Patient Active Hospital Problem List: Atrial fibrillation   (09/22/2012)  * Dyspnea (09/21/2012)  * Accelerated hypertension (09/21/2012)  CAD (coronary artery disease) s/p cabg (09/21/2012)   Acute on chronic systolic heart failure (09/25/2012)   Odynophagia (09/26/2012)  * Epistaxis (09/27/2012)  Ischemic cardiomyopathy  ? additional rate component (09/29/2012)   Seizure (10/01/2012)   Renal insufficiency (10/01/2012)   Coagulopathy (10/03/2012)   Much improved mental status,  The risks of no coumadin ,per [facility] will have to be endured.   Begin to mobilize  I am inclined to begin amio for the unproven hypotheseis that sinus should have lower risk than afib and control of rate and rhtym may help what ever component of cadriomyopathy may have been rate related     Signed, Sherryl Manges MD  10/03/2012

## 2012-10-03 NOTE — Progress Notes (Signed)
Subjective: No c/o dysphagia or odynophagia today.  Throat discomfort mostly resolved. More awake today.  Objective: Vital signs in last 24 hours: Temp:  [97.3 F (36.3 C)-98.4 F (36.9 C)] 97.5 F (36.4 C) (10/11 0730) Pulse Rate:  [69-80] 69  (10/11 0730) Resp:  [16-18] 18  (10/11 0730) BP: (93-130)/(52-79) 130/79 mmHg (10/11 0730) SpO2:  [97 %-100 %] 98 % (10/11 0730)  Physical Exam:  General: Pleasant male in NAD.  Head: No facial asymmetry or swelling  Eyes: No conj pallor or icterus. His pupils are equal, round, reactive to light. Extraocular motion is intact.  ENT: Examination of the ears shows normal auricles and external auditory canals bilaterally. Nasal examination shows congested mucosa, septum, turbinates. No acute bleeding. Facial examination shows no asymmetry. Palpation of the face elicit no significant tenderness. Oral cavity examination shows no significant edema and erythema or the left tonsils. Palpation of the neck reveals no lymphadenopathy or mass. The trachea is midline. The thyroid is not significantly enlarged.    Basename 10/03/12 0420 10/02/12 0722  WBC 7.8 5.4  HGB 11.1* 11.4*  HCT 33.7* 34.9*  PLT 167 167    Basename 10/03/12 0420 10/02/12 0500  NA 132* 130*  K 4.1 4.6  CL 99 95*  CO2 25 26  GLUCOSE 135* 69*  BUN 25* 39*  CREATININE 1.15 1.45*  CALCIUM 8.8 8.8    Medications:  I have reviewed the patient's current medications. Scheduled:   . antiseptic oral rinse  15 mL Mouth Rinse BID  . clindamycin (CLEOCIN) IV  600 mg Intravenous Q8H  . flumazenil  0.5 mg Intravenous Once  . levetiracetam  500 mg Intravenous Q12H  . LORazepam  2 mg Intravenous Once  . metoprolol tartrate  12.5 mg Oral BID  . potassium chloride  20 mEq Oral BID  . simvastatin  40 mg Oral q1800  . sodium chloride  10-40 mL Intracatheter Q12H  . sodium chloride  3 mL Intravenous Q12H   ZOX:WRUEAV chloride, acetaminophen, ALPRAZolam, guaiFENesin, LORazepam, morphine  injection, ondansetron (ZOFRAN) IV, phenol, sodium chloride, sodium chloride  Assessment/Plan: The patient's left tonsillitis and peritonsillar cellulitis have mostly resolved. - Complete 7 more days of clindamycin (oral or IV)  - Regular diet.  - Will sign off.  Please call with questions or concerns.   LOS: 12 days   Karolina Zamor,SUI W 10/03/2012, 8:31 AM

## 2012-10-04 ENCOUNTER — Inpatient Hospital Stay (HOSPITAL_COMMUNITY): Payer: PRIVATE HEALTH INSURANCE

## 2012-10-04 LAB — BASIC METABOLIC PANEL
BUN: 21 mg/dL (ref 6–23)
Calcium: 9 mg/dL (ref 8.4–10.5)
Creatinine, Ser: 1.06 mg/dL (ref 0.50–1.35)
GFR calc non Af Amer: 73 mL/min — ABNORMAL LOW (ref >90)
Glucose, Bld: 81 mg/dL (ref 70–99)

## 2012-10-04 LAB — CBC
HCT: 33 % — ABNORMAL LOW (ref 39.0–52.0)
Hemoglobin: 10.9 g/dL — ABNORMAL LOW (ref 13.0–17.0)
MCH: 26.7 pg (ref 26.0–34.0)
MCHC: 33 g/dL (ref 30.0–36.0)

## 2012-10-04 MED ORDER — ASPIRIN EC 81 MG PO TBEC
81.0000 mg | DELAYED_RELEASE_TABLET | Freq: Every day | ORAL | Status: DC
  Administered 2012-10-04 – 2012-10-10 (×7): 81 mg via ORAL
  Filled 2012-10-04 (×7): qty 1

## 2012-10-04 NOTE — Progress Notes (Signed)
Subjective: No further seizures noted.  Patient remains on Keppra.  Does not report any side effects.    Objective: Current vital signs: BP 149/69  Pulse 77  Temp 97.7 F (36.5 C) (Oral)  Resp 15  Ht 5\' 11"  (1.803 m)  Wt 94.8 kg (208 lb 15.9 oz)  BMI 29.15 kg/m2  SpO2 100% Vital signs in last 24 hours: Temp:  [97.7 F (36.5 C)-99.2 F (37.3 C)] 97.7 F (36.5 C) (10/12 1145) Pulse Rate:  [70-77] 77  (10/12 1145) Resp:  [15-17] 15  (10/12 1145) BP: (117-149)/(60-84) 149/69 mmHg (10/12 1145) SpO2:  [95 %-100 %] 100 % (10/12 1145)  Intake/Output from previous day: 10/11 0701 - 10/12 0700 In: 969 [P.O.:240; I.V.:474; IV Piggyback:255] Out: 825 [Urine:825] Intake/Output this shift: Total I/O In: 646 [P.O.:520; I.V.:126] Out: 730 [Urine:730] Nutritional status: Cardiac  Neurologic Exam: Mental Status: Alert, oriented, thought content appropriate.  Speech fluent without evidence of aphasia.  Able to follow 3 step commands without difficulty. Cranial Nerves: II: Discs flat bilaterally; Visual fields grossly normal, pupils equal, round, reactive to light and accommodation III,IV, VI: ptosis not present, extra-ocular motions intact bilaterally V,VII: smile symmetric, facial light touch sensation normal bilaterally VIII: hearing normal bilaterally IX,X: gag reflex present XI: bilateral shoulder shrug XII: midline tongue extension Motor: Moves all extremities equally and strongly Sensory: Pinprick and light touch intact throughout with pain over areas of ecchymosis Deep Tendon Reflexes: 2+ and symmetric throughout Plantars: Right: downgoing   Left: downgoing CV: pulses palpable throughout   Lab Results: Basic Metabolic Panel:  Lab 10/04/12 1610 Oct 22, 2012 0420 10/02/12 0500 10/01/12 0502 09/30/12 0500  NA 136 132* 130* 132* 135  K 4.8 4.1 4.6 4.2 4.1  CL 103 99 95* 93* 93*  CO2 25 25 26 30  33*  GLUCOSE 81 135* 69* 78 76  BUN 21 25* 39* 46* 44*  CREATININE 1.06 1.15  1.45* 1.78* 1.61*  CALCIUM 9.0 8.8 8.8 -- --  MG -- -- -- -- --  PHOS -- -- -- -- --    Liver Function Tests: No results found for this basename: AST:5,ALT:5,ALKPHOS:5,BILITOT:5,PROT:5,ALBUMIN:5 in the last 168 hours No results found for this basename: LIPASE:5,AMYLASE:5 in the last 168 hours No results found for this basename: AMMONIA:3 in the last 168 hours  CBC:  Lab 10/04/12 0425 10/22/2012 0420 10/02/12 0722 10/02/12 0500 10/01/12 0502  WBC 7.7 7.8 5.4 5.6 6.7  NEUTROABS -- -- 2.9 -- --  HGB 10.9* 11.1* 11.4* 11.3* 11.6*  HCT 33.0* 33.7* 34.9* 34.8* 35.6*  MCV 80.9 80.2 80.4 79.8 79.5  PLT 176 167 167 140* 163    Cardiac Enzymes: No results found for this basename: CKTOTAL:5,CKMB:5,CKMBINDEX:5,TROPONINI:5 in the last 168 hours  Lipid Panel: No results found for this basename: CHOL:5,TRIG:5,HDL:5,CHOLHDL:5,VLDL:5,LDLCALC:5 in the last 168 hours  CBG: No results found for this basename: GLUCAP:5 in the last 168 hours  Microbiology: Results for orders placed during the hospital encounter of 09/21/12  MRSA PCR SCREENING     Status: Normal   Collection Time   09/22/12  4:55 PM      Component Value Range Status Comment   MRSA by PCR NEGATIVE  NEGATIVE Final     Coagulation Studies:  Basename 10/04/12 0425 10/22/2012 0420 10/02/12 0722 10/02/12 0500  LABPROT 16.9* 17.2* 19.5* 19.7*  INR 1.41 1.44 1.71* 1.73*    Imaging: No results found.  Medications:  I have reviewed the patient's current medications. Scheduled:   . antiseptic oral rinse  15  mL Mouth Rinse BID  . aspirin EC  81 mg Oral Daily  . flumazenil  0.5 mg Intravenous Once  . levetiracetam  500 mg Intravenous Q12H  . LORazepam  2 mg Intravenous Once  . metoprolol tartrate  12.5 mg Oral BID  . potassium chloride  20 mEq Oral BID  . simvastatin  40 mg Oral q1800  . sodium chloride  10-40 mL Intracatheter Q12H  . sodium chloride  3 mL Intravenous Q12H    Assessment/Plan:  Patient Active Hospital  Problem List: Abnormal CT   Assessment: Abnormalities noted on imaging stable after last evaluation.  Repeat recommended since MRI unable to be performed.     Plan:  Repeat head CT without contrast Seizure (10/01/2012)   Assessment: Controlled on Keppra.   Plan: Continue Keppra at current dose    LOS: 13 days   Thana Farr, MD [facility] [phone] 10/04/2012  2:45 PM

## 2012-10-04 NOTE — Progress Notes (Signed)
CARDIAC REHAB PHASE I   PRE:  Rate/Rhythm: 78 SR  BP:  Supine:   Sitting: 125/80 Rt thigh Standing:    SaO2: 100% RA  MODE:  Ambulation: 350 ft   POST:  Rate/Rhythem: 88 SR  BP:  Supine:   Sitting:   Standing:    SaO2: 100% RA  1200-1227 Pt tolerated ambulation well with assist x2 and pushing rolling walker. Gait steady, no c/o during walk. Assisted to bathroom, then to chair after walk, VSS.  Annetta Maw

## 2012-10-04 NOTE — Progress Notes (Signed)
1015.  Patient complained of foot discomfort refuses to ambulate will follow up with the patient later.

## 2012-10-04 NOTE — Plan of Care (Signed)
Problem: Phase III Progression Outcomes Goal: Activity at appropriate level-compared to baseline (UP IN CHAIR FOR HEMODIALYSIS)  Outcome: Progressing Pt ambulating with PT today and required no o2

## 2012-10-04 NOTE — Progress Notes (Signed)
Patient: Louis Ford Date of Encounter: 10/04/2012, 10:46 AM Admit date: 09/21/2012     Subjective  No c/o CP, SOB, or palps. RUE pain improving   Objective   Telemetry: NSR Physical Exam: Filed Vitals:   10/04/12 0730  BP: 117/71  Pulse: 77  Temp: 98.2 F (36.8 C)  Resp: 17   General: Well developed, well nourished, in no acute distress. Head: Normocephalic, atraumatic, sclera non-icteric, no xanthomas, nares are without discharge.  Neck: Negative for carotid bruits. JVD not elevated. Lungs: Clear bilaterally to auscultation without wheezes, rales, or rhonchi. Breathing is unlabored. Heart: RRR; II-III/VI sys murmur Abdomen: Soft, non-tender, non-distended with normoactive bowel sounds. No hepatomegaly. No rebound/guarding. No obvious abdominal masses. Msk:  Strength and tone appear normal for age. Extremities: RUE swollen, ecchymotic Neuro: Alert and oriented X 3. Moves all extremities spontaneously. Psych:  Responds to questions appropriately with a normal affect.    Intake/Output Summary (Last 24 hours) at 10/04/12 1046 Last data filed at 10/04/12 0745  Gross per 24 hour  Intake    679 ml  Output   1175 ml  Net   -496 ml    Inpatient Medications:    . antiseptic oral rinse  15 mL Mouth Rinse BID  . flumazenil  0.5 mg Intravenous Once  . levetiracetam  500 mg Intravenous Q12H  . LORazepam  2 mg Intravenous Once  . metoprolol tartrate  12.5 mg Oral BID  . potassium chloride  20 mEq Oral BID  . simvastatin  40 mg Oral q1800  . sodium chloride  10-40 mL Intracatheter Q12H  . sodium chloride  3 mL Intravenous Q12H    Labs:  Basename 10/04/12 0425 10/03/12 0420  NA 136 132*  K 4.8 4.1  CL 103 99  CO2 25 25  GLUCOSE 81 135*  BUN 21 25*  CREATININE 1.06 1.15  CALCIUM 9.0 8.8  MG -- --  PHOS -- --   No results found for this basename: AST:2,ALT:2,ALKPHOS:2,BILITOT:2,PROT:2,ALBUMIN:2 in the last 72 hours No results found for this basename:  LIPASE:2,AMYLASE:2 in the last 72 hours  Basename 10/04/12 0425 10/03/12 0420 10/02/12 0722  WBC 7.7 7.8 --  NEUTROABS -- -- 2.9  HGB 10.9* 11.1* --  HCT 33.0* 33.7* --  MCV 80.9 80.2 --  PLT 176 167 --   No results found for this basename: CKTOTAL:4,CKMB:4,TROPONINI:4 in the last 72 hours No components found with this basename: POCBNP:3 No results found for this basename: DDIMER in the last 72 hours No results found for this basename: HGBA1C in the last 72 hours No results found for this basename: CHOL,HDL,LDLCALC,TRIG,CHOLHDL in the last 72 hours No results found for this basename: TSH,T4TOTAL,FREET3,T3FREE,THYROIDAB in the last 72 hours No results found for this basename: VITAMINB12,FOLATE,FERRITIN,TIBC,IRON,RETICCTPCT in the last 72 hours  Radiology/Studies:  Ct Head Wo Contrast  10/01/2012  *RADIOLOGY REPORT*  Clinical Data: 66 year old male altered mental status and seizure.  CT HEAD WITHOUT CONTRAST  Technique:  Contiguous axial images were obtained from the base of the skull through the vertex without contrast.  Comparison: 0047 hours the same day.  Findings: Stable paranasal sinuses and mastoids. Stable visualized osseous structures.  No scalp hematoma identified.  Calcified atherosclerosis at the skull base.  Asymmetry of the left sylvian fissure persists and is unchanged. The finding consists of both cortically based hyperdensity as well as asymmetric volume loss compared to the opposite side.  A sub insular hypodensity also noted.  Stable left MCA M2 branch which occurs  in this region.  As before, I think the findings probably are unchanged since 09/26/2012.  No convincing intracranial hemorrhage identified.  No ventriculomegaly.  Stable gray-white matter differentiation elsewhere.  No acute cortically based infarct identified.  IMPRESSION: 1.  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  In light of the  patient's ongoing anticoagulation for atrial fibrillation, I recommend one additional follow-up noncontrast head CT to ensure stability in 48 hours, unless clinically indicated sooner. 2.  No new intracranial findings.   Original Report Authenticated By: Harley Hallmark, M.D.    Ct Head Wo Contrast  10/01/2012  *RADIOLOGY REPORT*  Clinical Data: 66 year old male new onset seizure. Possible history of seizure disorder.  Altered mental status.  CT HEAD WITHOUT CONTRAST  Technique:  Contiguous axial images were obtained from the base of the skull through the vertex without contrast.  Comparison: Neck CT 09/26/2012.  Findings: Visualized paranasal sinuses and mastoids are clear. Visualized orbit soft tissues are within normal limits.  No acute scalp soft tissue findings.  Calcified atherosclerosis at the skull base.  No ventriculomegaly. No intraventricular hemorrhage. No midline shift, mass effect, or evidence of mass lesion.  Scattered subcortical white matter hypodensity, mild for age.  Asymmetric hyperdensity along the left sylvian fissure.  This might be cortically based rather than being in the subarachnoid space. There is no subarachnoid or intracranial hemorrhage identified elsewhere.  Asymmetric hyperdensity associated with a left MCA branch on image 11 is stable from the prior.  No other suspicious intracranial vascular hyperdensity.  Basilar cisterns appear normal.  IMPRESSION: 1. Asymmetric hyperdensity along the left sylvian fissure of uncertain significance.  The appearance is atypical for a small volume of subarachnoid hemorrhage.  This might instead reflect a cortically based process, and it may be stable compared to the prior neck CT.  No associated mass effect. No other evidence of intracranial hemorrhage. The patient cannot have the brain MRI due to cardiac AICD. I think a repeat noncontrast head CT in 8-12 hours may be most helpful to evaluate for any change in the appearance of the left sylvian  fissure. If the patient's condition or mental status deteriorates recommend stat repeat Head CT. 2.  Otherwise no acute intracranial abnormality.  Mild nonspecific white matter changes.  Case discussed by telephone with Nurse Hillery Aldo at the time of dictation. She advises that the patient has been on heparin/Lovenox for atrial fibrillation.  Furthermore, the retropharyngeal abnormality described on the comparison is favored by the clinical team to be hematoma following transesophageal echo.   Original Report Authenticated By: Harley Hallmark, M.D.    Ct Soft Tissue Neck Wo Contrast  09/26/2012  *RADIOLOGY REPORT*  Clinical Data:  Neck pain.  Chest pain with swallowing.  Dysphagia. ASSESS FOR PNEUMOMEDIASTINUM.  CT NECK AND CHEST WITHOUT CONTRAST  Technique:  Multidetector CT imaging of the neck and chest was performed using the standard protocol without intravenous contrast.  Comparison:  09/21/2012  CT NECK  Findings:  Limited visualization of the intracranial contents does not show any acute finding.  There is atherosclerotic calcification.  There is pronounced prevertebral soft tissue swelling.  This is most consistent with inflammatory disease.  There is tonsillar enlargement left more than right that could be due to tonsillitis. There is no extraluminal air / gas.  Thyroid gland is normal. Submandibular glands are normal.  Parotid glands are normal.  No definable lymphadenopathy.  Ordinary cervical spondylosis is  present.  IMPRESSION: Pronounced prevertebral soft tissue swelling most consistent with inflammatory disease.  Tonsillar enlargement asymmetrically more prominent on the left, also probably inflammatory.  Detail is limited in the absence of contrast administration.  I do not see a low density collection to suggest frank abscess, but sensitivity is limited without contrast.  CT CHEST  Findings: There is no evidence of acute mediastinal pathology.  No extraluminal gas.  There has been previous  median sternotomy. Pacemaker is in place.  The heart is enlarged.  Chronic pericardial calcification again noted.  There is less pleural fluid when compared to the previous study.  Patchy pulmonary opacities in the lower lobes not seen previously could represent developing pneumonia. No spinal lesion of significance.  IMPRESSION: No pneumomediastinum.  Less pleural fluid than seen previously.  Newly seen patchy density in both lung bases that could represent developing pneumonia.   Original Report Authenticated By: Thomasenia Sales, M.D.    Ct Chest Wo Contrast  09/26/2012  *RADIOLOGY REPORT*  Clinical Data:  Neck pain.  Chest pain with swallowing.  Dysphagia. ASSESS FOR PNEUMOMEDIASTINUM.  CT NECK AND CHEST WITHOUT CONTRAST  Technique:  Multidetector CT imaging of the neck and chest was performed using the standard protocol without intravenous contrast.  Comparison:  09/21/2012  CT NECK  Findings:  Limited visualization of the intracranial contents does not show any acute finding.  There is atherosclerotic calcification.  There is pronounced prevertebral soft tissue swelling.  This is most consistent with inflammatory disease.  There is tonsillar enlargement left more than right that could be due to tonsillitis. There is no extraluminal air / gas.  Thyroid gland is normal. Submandibular glands are normal.  Parotid glands are normal.  No definable lymphadenopathy.  Ordinary cervical spondylosis is present.  IMPRESSION: Pronounced prevertebral soft tissue swelling most consistent with inflammatory disease.  Tonsillar enlargement asymmetrically more prominent on the left, also probably inflammatory.  Detail is limited in the absence of contrast administration.  I do not see a low density collection to suggest frank abscess, but sensitivity is limited without contrast.  CT CHEST  Findings: There is no evidence of acute mediastinal pathology.  No extraluminal gas.  There has been previous median sternotomy. Pacemaker  is in place.  The heart is enlarged.  Chronic pericardial calcification again noted.  There is less pleural fluid when compared to the previous study.  Patchy pulmonary opacities in the lower lobes not seen previously could represent developing pneumonia. No spinal lesion of significance.  IMPRESSION: No pneumomediastinum.  Less pleural fluid than seen previously.  Newly seen patchy density in both lung bases that could represent developing pneumonia.   Original Report Authenticated By: Thomasenia Sales, M.D.      Assessment and Plan  Atrial fibrillation (09/22/2012)  - s/p successful TEE DCCV, 10.2.2013  - maintaining NSR on Lopressor  - No Coumadin anticoagulation for now, per HemeOnc  Dyspnea (09/21/2012) * Accelerated hypertension (09/21/2012)  CAD (coronary artery disease) s/p cabg (09/21/2012)  Acute on chronic systolic heart failure (09/25/2012)  - Will need reassessment of LVEF in a few months, to see if it improves in NSR  - EF 20-25%, by current echo  - low dose beta blocker initiated  - neg I/Os x24 hours  Ischemic cardiomyopathy ? additional rate component (09/29/2012)  Seizure (10/01/2012) - f/u Head CT ordered  - L sylvian fissure asymmetry, stable  Renal insufficiency (10/01/2012) - renal fxn stable  Coagulopathy (10/03/2012)  - question non specific lupus  anticoagulant type inhibitor  - HemeOnc following: recommend antiplatelet agents ASA +/- plavix, if needed for thromboprophylaxis  PLAN: Continue current medication regimen.   Signed, SERPE, EUGENE PA-C  Patient examined and agree except changes made. Holding NSR. Continue beta blocker. Will start ASA 81mg  per Dr Cyndie Chime.  Valera Castle, MD 10/04/2012 11:17 AM

## 2012-10-04 NOTE — Plan of Care (Signed)
Problem: Phase II Progression Outcomes Goal: Tolerating diet Outcome: Completed/Met Date Met:  10/04/12 Established routine of meals. Pt eating well

## 2012-10-04 NOTE — Plan of Care (Signed)
Problem: Phase III Progression Outcomes Goal: Dyspnea controlled with activity Outcome: Progressing Able to ambulate with pt without difficulty

## 2012-10-05 LAB — CBC
HCT: 32.5 % — ABNORMAL LOW (ref 39.0–52.0)
MCHC: 32.3 g/dL (ref 30.0–36.0)
MCV: 81 fL (ref 78.0–100.0)
RDW: 17 % — ABNORMAL HIGH (ref 11.5–15.5)

## 2012-10-05 LAB — BASIC METABOLIC PANEL
BUN: 20 mg/dL (ref 6–23)
Creatinine, Ser: 1.05 mg/dL (ref 0.50–1.35)
GFR calc Af Amer: 85 mL/min — ABNORMAL LOW (ref >90)
GFR calc non Af Amer: 74 mL/min — ABNORMAL LOW (ref >90)

## 2012-10-05 MED ORDER — LISINOPRIL 2.5 MG PO TABS
2.5000 mg | ORAL_TABLET | Freq: Every day | ORAL | Status: DC
  Administered 2012-10-05 – 2012-10-10 (×6): 2.5 mg via ORAL
  Filled 2012-10-05 (×6): qty 1

## 2012-10-05 MED ORDER — ALTEPLASE 2 MG IJ SOLR
2.0000 mg | Freq: Once | INTRAMUSCULAR | Status: AC
  Administered 2012-10-05: 2 mg
  Filled 2012-10-05: qty 2

## 2012-10-05 NOTE — Progress Notes (Signed)
Subjective:  Feels better. No SOB. Legs hurt a little, arm as well, he says.   PMHX .  CAD (coronary artery disease)  .  Chronic systolic heart failure  .  Ischemic cardiomyopathy  EF 15 to 20% by TTE and TEE in 09/2012. Severe LV dysfunction  .  HTN (hypertension)  .  Hyperlipidemia  .  Obesity  BMI 31 in 09/2012  .  Atrial fibrillation  09/22/2012  .  implantable cardiac defibrillator-Biotronik  Device Implanted 2006 Reimplanted 2012 : Explanted and Reimplanted 7/12  .  Stroke  .  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  History of MRSA infection  05/2011  .  Seizure disorder  latest 09/30/2012   Objective:  Vital Signs in the last 24 hours: Temp:  [97.7 F (36.5 C)-98.7 F (37.1 C)] 98.7 F (37.1 C) (10/13 0800) Pulse Rate:  [73-82] 73  (10/13 0800) Resp:  [15-17] 17  (10/12 1600) BP: (128-150)/(31-73) 150/42 mmHg (10/13 0800) SpO2:  [97 %-100 %] 99 % (10/13 0800) Weight:  [92.761 kg (204 lb 8 oz)] 92.761 kg (204 lb 8 oz) (10/13 0500)  Intake/Output from previous day: 10/12 0701 - 10/13 0700 In: 1397.7 [P.O.:880; I.V.:412.7; IV Piggyback:105] Out: 1555 [Urine:1555]   Physical Exam: General: Well developed, well nourished, in no acute distress. Head:  Normocephalic and atraumatic. Lungs: Clear to auscultation and percussion. Heart: RRR with frequent ectopy. No murmur, rubs or gallops.  Abdomen: soft, non-tender, positive bowel sounds. Extremities: No clubbing or cyanosis. No edema. Compression hose in place.  Neurologic: Alert and oriented x 3.    Lab Results:  Basename 10/05/12 0515 10/04/12 0425  WBC 7.5 7.7  HGB 10.5* 10.9*  PLT 160 176    Basename 10/05/12 0515 10/04/12 0425  NA 132* 136  K 4.7 4.8  CL 101 103  CO2 24 25  GLUCOSE 83 81  BUN 20 21  CREATININE 1.05 1.06   Telemetry: NSR with PAC's Personally viewed.   EKG:  SR, PAC  Cardiac Studies:  EF 15%  Assessment/Plan:  Principal Problem:  *Atrial fibrillation     Active Problems:  Dyspnea  Accelerated hypertension  CAD (coronary artery disease) s/p cabg  Acute on chronic systolic heart failure  Odynophagia  Epistaxis  Ischemic cardiomyopathy  ? additional rate component  Seizure  Renal insufficiency  Coagulopathy  AFIB - now in NSR following TEE CV on 09/24/12. Metoprolol. Deemed not an anticoagulation candidate, factor 7 def. See Dr. Cyndie Chime note. Prior hematoma ICD insertion.  At increased risk however for CVA given elevated CHADS2 score. ASA only.   CAD - CABG - stable, no angina  Seizure - CT head 10/12 IMPRESSION:  1. Stable appearance of hyperdensity along the left sylvian  fissure. This favors cortical laminar necrosis.  2. 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  3. No acute intracranial abnormality.  4. The left sylvian fissure cortical laminar necrosis and / or  medial temporal encephalomalacia could certainly serve as a seizure  focus.  Neuro following. Note reviewed. No further imaging. Stable on Keppra. No further seizures. They have signed off.   Ischemic cardiomyopathy - possible tachy component as well. Dr. Graciela Husbands stated that he was inclined to give amiodarone. See his prior note. Low dose Metoprolol. Not on ACE-I. BP would likely tolerate. I will add lisinopril 2.5mg  QD, low dose. Titrate as BP allows. Creat normal. Was previously 1.5. On no lasix. No apparent volume overload. May need low dose  in future. Monitor.      SKAINS, MARK 10/05/2012, 10:11 AM

## 2012-10-05 NOTE — Progress Notes (Signed)
Subjective: Patient sitting in chair.  Awake, alert and appropriate.  Objective: Current vital signs: BP 150/42  Pulse 73  Temp 98.7 F (37.1 C) (Oral)  Resp 17  Ht 5\' 11"  (1.803 m)  Wt 92.761 kg (204 lb 8 oz)  BMI 28.52 kg/m2  SpO2 99% Vital signs in last 24 hours: Temp:  [97.7 F (36.5 C)-98.7 F (37.1 C)] 98.7 F (37.1 C) (10/13 0800) Pulse Rate:  [73-82] 73  (10/13 0800) Resp:  [15-17] 17  (10/12 1600) BP: (128-150)/(31-73) 150/42 mmHg (10/13 0800) SpO2:  [97 %-100 %] 99 % (10/13 0800) Weight:  [92.761 kg (204 lb 8 oz)] 92.761 kg (204 lb 8 oz) (10/13 0500)  Intake/Output from previous day: 2023-10-22 0701 - 10/13 0700 In: 1397.7 [P.O.:880; I.V.:412.7; IV Piggyback:105] Out: 1555 [Urine:1555] Intake/Output this shift: Total I/O In: 345 [P.O.:240; IV Piggyback:105] Out: -  Nutritional status: Cardiac  Neurologic Exam: Mental Status:  Alert, oriented, thought content appropriate. Speech fluent without evidence of aphasia. Able to follow 3 step commands without difficulty.  Cranial Nerves:  II: Discs flat bilaterally; Visual fields grossly normal, pupils equal, round, reactive to light and accommodation  III,IV, VI: ptosis not present, extra-ocular motions intact bilaterally  V,VII: smile symmetric, facial light touch sensation normal bilaterally  VIII: hearing normal bilaterally  IX,X: gag reflex present  XI: bilateral shoulder shrug  XII: midline tongue extension  Motor:  Moves all extremities equally and strongly  Sensory: Pinprick and light touch intact throughout with pain over areas of ecchymosis  Deep Tendon Reflexes: 2+ and symmetric throughout  Plantars:  Right: downgoing Left: downgoing  CV: pulses palpable throughout    Lab Results: Basic Metabolic Panel:  Lab 10/05/12 1610 10/21/2012 0425 10/03/12 0420 10/02/12 0500 10/01/12 0502  NA 132* 136 132* 130* 132*  K 4.7 4.8 4.1 4.6 4.2  CL 101 103 99 95* 93*  CO2 24 25 25 26 30   GLUCOSE 83 81 135* 69* 78    BUN 20 21 25* 39* 46*  CREATININE 1.05 1.06 1.15 1.45* 1.78*  CALCIUM 9.0 9.0 8.8 -- --  MG -- -- -- -- --  PHOS -- -- -- -- --    Liver Function Tests: No results found for this basename: AST:5,ALT:5,ALKPHOS:5,BILITOT:5,PROT:5,ALBUMIN:5 in the last 168 hours No results found for this basename: LIPASE:5,AMYLASE:5 in the last 168 hours No results found for this basename: AMMONIA:3 in the last 168 hours  CBC:  Lab 10/05/12 0515 October 21, 2012 0425 10/03/12 0420 10/02/12 0722 10/02/12 0500  WBC 7.5 7.7 7.8 5.4 5.6  NEUTROABS -- -- -- 2.9 --  HGB 10.5* 10.9* 11.1* 11.4* 11.3*  HCT 32.5* 33.0* 33.7* 34.9* 34.8*  MCV 81.0 80.9 80.2 80.4 79.8  PLT 160 176 167 167 140*    Cardiac Enzymes: No results found for this basename: CKTOTAL:5,CKMB:5,CKMBINDEX:5,TROPONINI:5 in the last 168 hours  Lipid Panel: No results found for this basename: CHOL:5,TRIG:5,HDL:5,CHOLHDL:5,VLDL:5,LDLCALC:5 in the last 168 hours  CBG: No results found for this basename: GLUCAP:5 in the last 168 hours  Microbiology: Results for orders placed during the hospital encounter of 09/21/12  MRSA PCR SCREENING     Status: Normal   Collection Time   09/22/12  4:55 PM      Component Value Range Status Comment   MRSA by PCR NEGATIVE  NEGATIVE Final     Coagulation Studies:  Basename 10/05/12 0515 10/21/12 0425 10/03/12 0420  LABPROT 15.4* 16.9* 17.2*  INR 1.24 1.41 1.44    Imaging: Ct Head Wo Contrast  10/04/2012  *RADIOLOGY REPORT*  Clinical Data: Seizure.  Abnormal CT scan.  CT HEAD WITHOUT CONTRAST  Technique:  Contiguous axial images were obtained from the base of the skull through the vertex without contrast.  Comparison: CT of the head without contrast 10/01/2012.  Findings: Linear density within the posterior left sylvian fissure is stable.  Focal hypoattenuation within the medial left temporal lobe is also stable.  No acute cortical infarct, hemorrhage, or mass lesion is present. Ventricles are of normal  size.  Minimal white matter disease is present.  No significant extra-axial fluid collection is present.  The paranasal sinuses and mastoid air cells are clear. Atherosclerotic calcifications are again noted within the cavernous carotid arteries.  IMPRESSION:  1.  Stable appearance of hyperdensity along the left sylvian fissure.  This favors cortical laminar necrosis. 2.  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 3.  No acute intracranial abnormality. 4.  The left sylvian fissure cortical laminar necrosis and / or medial temporal encephalomalacia could certainly serve as a seizure focus.   Original Report Authenticated By: Jamesetta Orleans. MATTERN, M.D.     Medications:  I have reviewed the patient's current medications. Scheduled:   . antiseptic oral rinse  15 mL Mouth Rinse BID  . aspirin EC  81 mg Oral Daily  . flumazenil  0.5 mg Intravenous Once  . levetiracetam  500 mg Intravenous Q12H  . LORazepam  2 mg Intravenous Once  . metoprolol tartrate  12.5 mg Oral BID  . potassium chloride  20 mEq Oral BID  . simvastatin  40 mg Oral q1800  . sodium chloride  10-40 mL Intracatheter Q12H  . sodium chloride  3 mL Intravenous Q12H    Assessment/Plan:  Patient Active Hospital Problem List: Abnormal CT   Assessment:  Repeat CT shows no evidence of hemorrhage, findings stable.  Abnormal findings likely chronic.   Plan:  No further work up or imaging required. Seizure (10/01/2012)   Assessment: No further seizures noted.  Stable on Keppra   Plan: 1.  Continue Keppra at current dose.  No further neurologic intervention is recommended at this time.  If further questions arise, please call or page at that time.  Thank you for allowing neurology to participate in the care of this patient.     LOS: 14 days   Thana Farr, MD [facility] [phone] 10/05/2012  8:24 AM

## 2012-10-06 DIAGNOSIS — D6859 Other primary thrombophilia: Secondary | ICD-10-CM

## 2012-10-06 DIAGNOSIS — D689 Coagulation defect, unspecified: Secondary | ICD-10-CM

## 2012-10-06 LAB — PT FACTOR INHIBITOR (MIXING STUDY)
PT, Mixing Interp: NOT DETECTED
Patient post incubation-ptmix: 13.3 seconds
Patient-1/1, Immediate Mix-PT: 11.5 seconds
Patient-1/1, Incubated Mix-PT: 12.1 seconds

## 2012-10-06 LAB — BASIC METABOLIC PANEL
BUN: 18 mg/dL (ref 6–23)
BUN: 17 mg/dL (ref 6–23)
CO2: 24 mEq/L (ref 19–32)
CO2: 25 mEq/L (ref 19–32)
Calcium: 8.9 mg/dL (ref 8.4–10.5)
Chloride: 102 mEq/L (ref 96–112)
Creatinine, Ser: 1.02 mg/dL (ref 0.50–1.35)
Creatinine, Ser: 0.95 mg/dL (ref 0.50–1.35)
GFR calc Af Amer: >90 mL/min (ref >90)
Glucose, Bld: 86 mg/dL (ref 70–99)

## 2012-10-06 LAB — CBC
HCT: 32.6 % — ABNORMAL LOW (ref 39.0–52.0)
MCH: 26.6 pg (ref 26.0–34.0)
MCV: 80.9 fL (ref 78.0–100.0)
Platelets: 211 10*3/uL (ref 150–400)
RBC: 4.03 MIL/uL — ABNORMAL LOW (ref 4.22–5.81)

## 2012-10-06 LAB — PTT FACTOR INHIBITOR (MIXING STUDY)
1 Hr Incub PT 1:1NP: 28 seconds
Interpretation-PTT Mixing Study: NOT DETECTED
PTT: 30 seconds — ABNORMAL HIGH (ref <30)

## 2012-10-06 LAB — LUPUS ANTICOAGULANT PANEL

## 2012-10-06 LAB — PROTIME-INR: Prothrombin Time: 15.4 seconds — ABNORMAL HIGH (ref 11.6–15.2)

## 2012-10-06 MED ORDER — METOPROLOL TARTRATE 25 MG PO TABS
25.0000 mg | ORAL_TABLET | Freq: Two times a day (BID) | ORAL | Status: DC
  Administered 2012-10-06 – 2012-10-08 (×5): 25 mg via ORAL
  Filled 2012-10-06 (×8): qty 1

## 2012-10-06 NOTE — Progress Notes (Signed)
Subjective: Patient complains of aching all over.  No CP  NO SOB. Objective: Filed Vitals:   10/05/12 2348 10/06/12 0328 10/06/12 0745 10/06/12 0800  BP: 125/65 136/75 112/61 112/61  Pulse:   108   Temp: 99.7 F (37.6 C) 98.7 F (37.1 C) 97.8 F (36.6 C)   TempSrc: Oral Oral Oral   Resp:   18   Height:      Weight:  205 lb 4 oz (93.1 kg)    SpO2: 100% 100% 99% 97%   Weight change: 12 oz (0.339 kg)  Intake/Output Summary (Last 24 hours) at 10/06/12 1200 Last data filed at 10/06/12 0900  Gross per 24 hour  Intake   1150 ml  Output   1725 ml  Net   -575 ml    General: Alert, awake, oriented x3, in no acute distress Neck:  JVP is normal Heart: Regular rate and rhythm, without murmurs, rubs, gallops.  Lungs: Clear to auscultation.  No rales or wheezes. Exemities:  No edema.   Neuro: Grossly intact, nonfocal.  Tele::  SR Lab Results: Results for orders placed during the hospital encounter of 09/21/12 (from the past 24 hour(s))  CBC     Status: Abnormal   Collection Time   10/06/12  3:30 AM      Component Value Range   WBC 6.8  4.0 - 10.5 K/uL   RBC 4.03 (*) 4.22 - 5.81 MIL/uL   Hemoglobin 10.7 (*) 13.0 - 17.0 g/dL   HCT 16.1 (*) 09.6 - 04.5 %   MCV 80.9  78.0 - 100.0 fL   MCH 26.6  26.0 - 34.0 pg   MCHC 32.8  30.0 - 36.0 g/dL   RDW 40.9 (*) 81.1 - 91.4 %   Platelets 211  150 - 400 K/uL  BASIC METABOLIC PANEL     Status: Abnormal   Collection Time   10/06/12  3:30 AM      Component Value Range   Sodium 133 (*) 135 - 145 mEq/L   Potassium 4.6  3.5 - 5.1 mEq/L   Chloride 102  96 - 112 mEq/L   CO2 24  19 - 32 mEq/L   Glucose, Bld 96  70 - 99 mg/dL   BUN 18  6 - 23 mg/dL   Creatinine, Ser 7.82  0.50 - 1.35 mg/dL   Calcium 8.9  8.4 - 95.6 mg/dL   GFR calc non Af Amer 76 (*) >90 mL/min   GFR calc Af Amer 88 (*) >90 mL/min  PROTIME-INR     Status: Abnormal   Collection Time   10/06/12  3:30 AM      Component Value Range   Prothrombin Time 15.4 (*) 11.6 - 15.2  seconds   INR 1.24  0.00 - 1.49    Studies/Results: @RISRSLT24 @  Medications: Reviewed.   Patient Active Hospital Problem List: Atrial fibrillation   (09/22/2012)   Assessment: Remains in SR.  NOt on anticoagulation  Only ASA.   CAD (coronary artery disease) s/p cabg (09/21/2012)   Assessment:   Acute on chronic systolic heart failure (09/25/2012)   Assessment: Volume status looks good.     INcrease metoprolol to 25 bid.  HTN:  Adequate control.    Seizure (10/01/2012)   Assessment: Continue keppra  On IV  Will check with dosing to PO  Renal insufficiency (10/01/2012)   Assessment: Improved.  Coagulopathy (10/03/2012)   Assessment: Appreciate input by heme.  Await final on lab results  Achiness Not sure of  etiology   Will check CK  HOld statin.  Appears this may have been new.  Tx to tele.  WIll try to get to ambulate.   LOS: 15 days   Dietrich Pates 10/06/2012, 12:00 PM

## 2012-10-06 NOTE — Progress Notes (Signed)
Pts HR went up to 130's nonsustained, now 118-120.  Pt asymptomatic, Alinda Money PA, notified and gave order to give 2200 dose of metoprolol now and to continue to monitor pts rhythm.  Will carry out orders.

## 2012-10-06 NOTE — Progress Notes (Signed)
PT Cancellation Note  Patient Details Name: Raysean Catinella MRN: [id number] DOB: 1946/03/03   Cancelled Treatment:    Reason Eval/Treat Not Completed:  (Just moved to 4737 - refused)   INGOLD,Jaya Lapka 10/06/2012, 4:00 PM  [facility] Acute [facility] [phone] (pager)

## 2012-10-06 NOTE — Progress Notes (Signed)
Factor VII low at 32% but sample drawn when off coumadin for only 2 days.  Protime today 15.4 seconds - only 0.2 seconds over upper limit of normal so I suspect factor VII actually higher than 32% and should be repeated at an interval. Factor VIII higher than upper control - likely acute phase reactant but certainly excludes the presence of an inhibitor to factor VIII. Plasma mixing studies hopefully available today and will confirm factor deficiency versus inhibitor to coagulation.

## 2012-10-06 NOTE — Progress Notes (Signed)
Pt declined ambulation at this time. Sts his legs hurt. Could not be coerced. Will f/u as time allows. Ethelda Chick CES, ACSM

## 2012-10-06 NOTE — Progress Notes (Signed)
CARDIAC REHAB PHASE I   PRE:  Rate/Rhythm: 113ST PACs  BP:  Supine:   Sitting: 116/70  Standing:    SaO2:100%RA   MODE:  Ambulation: 580 ft   POST:  Rate/Rhythem: 123ST PACs  BP:  Supine:   Sitting: 115/74  Standing:    SaO2: 94%RA  1429-1507 Pt willing to walk now. Did not feel up to walking this am. Pt walked 580 ft on RA with rolling walker and gait belt. Tolerated well. C/o legs hurting. To recliner after walk.  Duanne Limerick

## 2012-10-07 ENCOUNTER — Encounter (HOSPITAL_COMMUNITY): Payer: Self-pay | Admitting: Oncology

## 2012-10-07 DIAGNOSIS — D682 Hereditary deficiency of other clotting factors: Secondary | ICD-10-CM

## 2012-10-07 DIAGNOSIS — I5023 Acute on chronic systolic (congestive) heart failure: Secondary | ICD-10-CM

## 2012-10-07 DIAGNOSIS — I4891 Unspecified atrial fibrillation: Secondary | ICD-10-CM

## 2012-10-07 LAB — CBC
HCT: 33.2 % — ABNORMAL LOW (ref 39.0–52.0)
Hemoglobin: 10.8 g/dL — ABNORMAL LOW (ref 13.0–17.0)
MCH: 26.5 pg (ref 26.0–34.0)
MCHC: 32.5 g/dL (ref 30.0–36.0)
MCV: 81.4 fL (ref 78.0–100.0)

## 2012-10-07 LAB — BASIC METABOLIC PANEL
BUN: 19 mg/dL (ref 6–23)
CO2: 24 mEq/L (ref 19–32)
Calcium: 9.1 mg/dL (ref 8.4–10.5)
Creatinine, Ser: 1.09 mg/dL (ref 0.50–1.35)
GFR calc non Af Amer: 70 mL/min — ABNORMAL LOW (ref >90)
Glucose, Bld: 79 mg/dL (ref 70–99)

## 2012-10-07 NOTE — Progress Notes (Signed)
Patient Name: Louis Ford      SUBJECTIVE: without chest pain,  stillwith aching  Past Medical History  Diagnosis Date  . CAD (coronary artery disease)   . Chronic systolic heart failure   . Ischemic cardiomyopathy     EF 15 to 20% by TTE and TEE in 09/2012.  Severe LV dysfunction  . HTN (hypertension)   . Hyperlipidemia   . Obesity     BMI 31 in 09/2012  . Atrial fibrillation   09/22/2012  . implantable cardiac defibrillator-Biotronik     Device Implanted 2006 Generator Rep 4/ 2012 : bleeding persistent with pocket erosion and infection; explant and reimplant  7/12  . Stroke   . 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 History of MRSA infection 05/2011  . Seizure disorder latest 09/30/2012  . 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    PHYSICAL EXAM Filed Vitals:   10/06/12 1510 10/06/12 1534 10/06/12 2052 10/07/12 0554  BP:  138/71 117/65 125/76  Pulse:   109 69  Temp:  99.3 F (37.4 C) 97.9 F (36.6 C) 98.2 F (36.8 C)  TempSrc:  Oral Oral Oral  Resp:  18 16 18   Height:  5\' 11"  (1.803 m)    Weight:  205 lb 4 oz (93.1 kg)  205 lb 11 oz (93.3 kg)  SpO2: 95% 100% 100% 100%   Well developed and nourished in no acute distress HENT normal Neck supple with JVP-flat Carotids brisk and full without bruits Clear Regular rate and rhythm, no murmurs or gallops Abd-soft with active BS without hepatomegaly No Clubbing cyanosis edema Skin-warm and dry A & Oriented  Grossly normal sensory and motor function   Reviewed telemetry pt in nsr:    Intake/Output Summary (Last 24 hours) at 10/07/12 0829 Last data filed at 10/07/12 0700  Gross per 24 hour  Intake    446 ml  Output   1300 ml  Net   -854 ml    LABS: Basic Metabolic Panel:  Lab 10/07/12 1610 10/06/12 1230 10/06/12 0330 10/05/12 0515 10/04/12 0425 10/03/12 0420 10/02/12 0500  NA 135 133* 133* 132* 136 132* 130*  K 4.8 4.6 4.6 4.7 4.8 4.1 4.6  CL 103 102 102 101 103 99 95*  CO2 24 25 24 24 25 25 26     GLUCOSE 79 86 96 83 81 135* 69*  BUN 19 17 18 20 21  25* 39*  CREATININE 1.09 0.95 1.02 1.05 1.06 1.15 1.45*  CALCIUM 9.1 8.7 -- -- -- -- --  MG -- -- -- -- -- -- --  PHOS -- -- -- -- -- -- --   Cardiac Enzymes: No results found for this basename: CKTOTAL:3,CKMB:3,CKMBINDEX:3,TROPONINI:3 in the last 72 hours CBC:  Lab 10/07/12 0500 10/06/12 0330 10/05/12 0515 10/04/12 0425 10/03/12 0420 10/02/12 0722 10/02/12 0500  WBC 6.8 6.8 7.5 7.7 7.8 5.4 5.6  NEUTROABS -- -- -- -- -- 2.9 --  HGB 10.8* 10.7* 10.5* 10.9* 11.1* 11.4* 11.3*  HCT 33.2* 32.6* 32.5* 33.0* 33.7* 34.9* 34.8*  MCV 81.4 80.9 81.0 80.9 80.2 80.4 79.8  PLT 234 211 160 176 167 167 140*   PROTIME:  Basename 10/07/12 0500 10/06/12 0330 10/05/12 0515  LABPROT 15.7* 15.4* 15.4*  INR 1.28 1.24 1.24   L BNP (last 3 results)   ASSESSMENT AND PLAN:  Patient Active Hospital Problem List: Atrial fibrillation   (09/22/2012)   Accelerated hypertension (09/21/2012)   Acute on chronic systolic heart failure (09/25/2012)  Ischemic cardiomyopathy  ? additional rate component (09/29/2012)   Seizure (10/01/2012)   Factor VII deficiency (10/07/2012)   myalgias Continue current course  Mobilize and hope for discharge in next 48 hrs Signed, Sherryl Manges MD  10/07/2012

## 2012-10-07 NOTE — Progress Notes (Signed)
Pt declined. Sts his arm, leg and ankle hurt. Sts he needs pain med. PT to follow up later. Ethelda Chick CES, ACSM

## 2012-10-07 NOTE — Progress Notes (Signed)
   PT Cancellation Note  Patient Details Name: Louis Ford MRN: [id number] DOB: Oct 13, 1946   Cancelled Treatment:    Reason Eval/Treat Not Completed:  (pt in bathroom and states he "will be awhile")   INGOLD,Velmer Broadfoot 10/07/2012, 3:48 PM [facility] Acute Rehabilitation [phone] (pager)

## 2012-10-07 NOTE — Progress Notes (Signed)
2321 Patient had a pause of 1.33 seconds. VS were taken and documented. Patient is asymptomatic. MD on call Dr.Butler notified.

## 2012-10-07 NOTE — Progress Notes (Signed)
Plasma mixing studies do not show presence of an inhibitor to coagulation. 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 Repeat factor VII level off coumadin for 1 week pending. If factor level remains less than 50% of control, he is essentially auto-anticoagulated. Safest thromboprophylaxis remains antiplatelet agents. He will need factor VII infusions to cover surgical procedures in the future. We can set up office follow up post discharge but my main role in the future will be to work with and advise his other MDs re peri-op factor VII coverage. As you know, this is an extremely expensive product that we must use judiciously.

## 2012-10-08 LAB — BASIC METABOLIC PANEL
BUN: 21 mg/dL (ref 6–23)
Creatinine, Ser: 1.06 mg/dL (ref 0.50–1.35)
GFR calc Af Amer: 84 mL/min — ABNORMAL LOW (ref >90)
GFR calc non Af Amer: 73 mL/min — ABNORMAL LOW (ref >90)
Glucose, Bld: 92 mg/dL (ref 70–99)

## 2012-10-08 LAB — CBC
HCT: 31.9 % — ABNORMAL LOW (ref 39.0–52.0)
Hemoglobin: 10.2 g/dL — ABNORMAL LOW (ref 13.0–17.0)
MCHC: 32 g/dL (ref 30.0–36.0)
MCV: 82.6 fL (ref 78.0–100.0)
RDW: 18.1 % — ABNORMAL HIGH (ref 11.5–15.5)

## 2012-10-08 MED ORDER — SODIUM CHLORIDE 0.9 % IV SOLN
500.0000 mg | Freq: Two times a day (BID) | INTRAVENOUS | Status: DC
  Administered 2012-10-08 – 2012-10-10 (×5): 500 mg via INTRAVENOUS
  Filled 2012-10-08 (×6): qty 5

## 2012-10-08 NOTE — Progress Notes (Signed)
Physical Therapy Treatment Patient Details Name: Duncan Alejandro MRN: [id number] DOB: 1946-09-28 Today's Date: 10/08/2012 Time: 8295-6213 PT Time Calculation (min): 23 min  PT Assessment / Plan / Recommendation Comments on Treatment Session  Pt. agreed to ambulate with RW and agrees to get one for home use.  HHPT and RW recommended.      Follow Up Recommendations  Home health PT;Supervision/Assistance - 24 hour     Does the patient have the potential to tolerate intense rehabilitation     Barriers to Discharge        Equipment Recommendations  Rolling walker with 5" wheels    Recommendations for Other Services    Frequency Min 3X/week   Plan Discharge plan remains appropriate;Frequency remains appropriate    Precautions / Restrictions Precautions Precautions: Fall Restrictions Weight Bearing Restrictions: No   Pertinent Vitals/Pain VSS, No pain    Mobility  Bed Mobility Bed Mobility: Not assessed Transfers Transfers: Sit to Stand;Stand to Sit Sit to Stand: 5: Supervision;With upper extremity assist;With armrests;From chair/3-in-1 Stand to Sit: 5: Supervision;With upper extremity assist;With armrests;To chair/3-in-1 Ambulation/Gait Ambulation/Gait Assistance: 5: Supervision Ambulation Distance (Feet): 600 Feet Assistive device: Rolling walker Ambulation/Gait Assistance Details: Pt. agreed to use RW.  Now agrees to get one for home use as well.  Cues to stay close to RW occasionally and for upright posture.  Gait Pattern: Step-through pattern;Decreased stride length;Trunk flexed Gait velocity: decreased Stairs: No Wheelchair Mobility Wheelchair Mobility: No    PT Goals Acute Rehab PT Goals PT Goal: Sit to Stand - Progress: Progressing toward goal PT Goal: Ambulate - Progress: Progressing toward goal  Visit Information  Last PT Received On: 10/08/12 Assistance Needed: +1    Subjective Data  Subjective: "I do need a RW."   Cognition  Overall Cognitive Status:  Appears within functional limits for tasks assessed/performed Arousal/Alertness: Awake/alert Orientation Level: Appears intact for tasks assessed Behavior During Session: [facility] for tasks performed    Balance  Static Standing Balance Static Standing - Balance Support: Bilateral upper extremity supported;During functional activity Static Standing - Level of Assistance: 6: Modified independent (Device/Increase time) Static Standing - Comment/# of Minutes: 1 minute with RW  End of Session PT - End of Session Equipment Utilized During Treatment: Gait belt Activity Tolerance: Patient tolerated treatment well Patient left: in chair;with call bell/phone within reach Nurse Communication: Mobility status       INGOLD,Linzy Laury 10/08/2012, 12:02 PM  Tasia Liz Ingold,PT Acute Rehabilitation [phone] (pager)

## 2012-10-08 NOTE — Progress Notes (Signed)
CARDIAC REHAB PHASE I   PRE:  Rate/Rhythm: 88SR  BP:  Supine:   Sitting: 116/80  Standing:    SaO2: 99%RA  MODE:  Ambulation: 350 ft   POST:  Rate/Rhythem: 79  BP:  Supine:   Sitting: 126/83  Standing:    SaO2: 100%RA 1413-1445 Pt tired from bathroom trip and earlier walk. Walked 350 ft on RA with rolling walker and asst x 1. Tolerated well. To recliner with call bell.  Duanne Limerick

## 2012-10-08 NOTE — Progress Notes (Signed)
Need to check with Neuro about keppra  They had ordered a one week dosing

## 2012-10-08 NOTE — Progress Notes (Signed)
Patient Name: [facility]      SUBJECTIVE: still with some soreness in his leg; but beter  Past Medical History  Diagnosis Date  . CAD (coronary artery disease)   . Chronic systolic heart failure   . Ischemic cardiomyopathy     EF 15 to 20% by TTE and TEE in 09/2012.  Severe LV dysfunction  . HTN (hypertension)   . Hyperlipidemia   . Obesity     BMI 31 in 09/2012  . Atrial fibrillation   09/22/2012  . implantable cardiac defibrillator-Biotronik     Device Implanted 2006 Generator Rep 4/ 2012 : bleeding persistent with pocket erosion and infection; explant and reimplant  7/12  . Stroke   . 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 History of MRSA infection 05/2011  . Seizure disorder latest 09/30/2012  . 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    PHYSICAL EXAM Filed Vitals:   10/07/12 1428 10/07/12 2100 10/07/12 2327 10/08/12 0529  BP: 126/78 118/66 102/59 124/72  Pulse: 92 72 106 65  Temp: 98 F (36.7 C) 98.4 F (36.9 C) 98.7 F (37.1 C) 98.1 F (36.7 C)  TempSrc: Oral Oral Oral Oral  Resp: 18 18 18 19   Height:      Weight:    205 lb 11 oz (93.3 kg)  SpO2: 100% 100% 100% 100%  Well developed and nourished in no acute distress HENT normal Neck supple with JVP-flat Clear Regular rate and rhythm, no murmurs or gallops Abd-soft with active BS No Clubbing cyanosis edema; somme swelling of  R forearm Tenderness of right leg just prox to ankle,to touch but able to walk Skin-warm and dry A & Oriented  Grossly normal sensory and motor function  TELEMETRY: Reviewed telemetry pt in *nsr     Intake/Output Summary (Last 24 hours) at 10/08/12 0815 Last data filed at 10/08/12 1610  Gross per 24 hour  Intake    850 ml  Output   1425 ml  Net   -575 ml    LABS: Basic Metabolic Panel:  Lab 10/08/12 9604 10/07/12 0500 10/06/12 1230 10/06/12 0330 10/05/12 0515 10/04/12 0425 10/03/12 0420  NA 135 135 133* 133* 132* 136 132*  K 4.6 4.8 4.6 4.6 4.7 4.8 4.1  CL 104 103 102  102 101 103 99  CO2 25 24 25 24 24 25 25   GLUCOSE 92 79 86 96 83 81 135*  BUN 21 19 17 18 20 21  25*  CREATININE 1.06 1.09 0.95 1.02 1.05 1.06 1.15  CALCIUM 8.8 9.1 -- -- -- -- --  MG -- -- -- -- -- -- --  PHOS -- -- -- -- -- -- --   Cardiac Enzymes: No results found for this basename: CKTOTAL:3,CKMB:3,CKMBINDEX:3,TROPONINI:3 in the last 72 hours CBC:  Lab 10/08/12 0503 10/07/12 0500 10/06/12 0330 10/05/12 0515 10/04/12 0425 10/03/12 0420 10/02/12 0722  WBC 5.7 6.8 6.8 7.5 7.7 7.8 5.4  NEUTROABS -- -- -- -- -- -- 2.9  HGB 10.2* 10.8* 10.7* 10.5* 10.9* 11.1* 11.4*  HCT 31.9* 33.2* 32.6* 32.5* 33.0* 33.7* 34.9*  MCV 82.6 81.4 80.9 81.0 80.9 80.2 80.4  PLT 226 234 211 160 176 167 167   PROTIME:  Basename 10/08/12 0503 10/07/12 0500 10/06/12 0330  LABPROT 15.6* 15.7* 15.4*  INR 1.27 1.28 1.24   Liver Function Tests: No results found for this basename: AST:2,ALT:2,ALKPHOS:2,BILITOT:2,PROT:2,ALBUMIN:2 in the last 72 hours No results found for this basename: LIPASE:2,AMYLASE:2 in the last 72 hours BNP: BNP (last  3 results)  Basename 09/22/12 0328 09/21/12 0515  PROBNP 3157.0* 2267.0*     ASSESSMENT AND PLAN:  Patient Active Hospital Problem List: Atrial fibrillation   (09/22/2012)   Dyspnea (09/21/2012)   Acute on chronic systolic heart failure (09/25/2012)   Odynophagia (09/26/2012)  * Ischemic cardiomyopathy  ? additional rate component (09/29/2012)   Seizure (10/01/2012)   Renal insufficiency (10/01/2012)   Factor VII deficiency (10/07/2012)    Change BB to metoprololo succ Will consider aldactone as outpt mobiilze today and shoot for home on Fri   SignedSherryl Manges MD  10/08/2012

## 2012-10-09 DIAGNOSIS — I2589 Other forms of chronic ischemic heart disease: Secondary | ICD-10-CM

## 2012-10-09 LAB — PROTIME-INR
INR: 1.23 (ref 0.00–1.49)
Prothrombin Time: 15.3 seconds — ABNORMAL HIGH (ref 11.6–15.2)

## 2012-10-09 LAB — CK: Total CK: 25 U/L (ref 7–232)

## 2012-10-09 MED ORDER — METOPROLOL SUCCINATE ER 25 MG PO TB24
25.0000 mg | ORAL_TABLET | Freq: Every day | ORAL | Status: DC
  Administered 2012-10-10: 25 mg via ORAL
  Filled 2012-10-09: qty 1

## 2012-10-09 NOTE — Progress Notes (Signed)
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 He was admitted 09/21/2012 with acute on chronic systolic HF. He was not taking any of his medications as he reports he could not afford them. On admission he was also found to have rapid AF. He was started on IV heparin and Coumadin. He underwent TEE-guided DCCV on 09/24/2012. He developed tonsillitis and peritonsillar cellulitis and was given IV clindamycin. While on anticoagulation, he developed a large RUE hematoma. 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 Anticoagulation was discontinued. In addition, Louis Ford had a seizure on 09/30/2012. There were no ventricular arrhythmias documented on telemetry. Neurology was consulted and ordered a head CT, EEG and IV Keppra. His head CT was abnormal; however, these findings were felt to be chronic and no further imaging was needed. His EEG showed medication effect. Neurology recommended continuing Keppra.  This AM, Louis Ford reports soreness in RUE and R leg. He denies CP. He reports DOE which he has noticed is "about the same" with daily PT exercises. He reports dizziness with postural changes.   Objective  Physical Exam: Vitals: BP 100/67  Pulse 66  Temp 97.5 F (36.4 C) (Oral)  Resp 18  Ht 5\' 11"  (1.803 m)  Wt 205 lb 11 oz (93.3 kg)  BMI 28.69 kg/m2  SpO2 100% General: Well developed 66 year old male in no acute distress. He is sitting comfortably in the bedside chair. Neck: Supple. JVD not elevated. Lungs:  Clear bilaterally to auscultation without wheezes, rales, or rhonchi. Breathing is unlabored. No wheezes, rales or rhonchi. Heart: RRR S1 S2 with low pitched II/VI systolic murmur along LSB. No rub or gallop.  Abdomen: Soft, non-distended. Extremities: No clubbing or cyanosis. No pedal edema. Right forearm with mild edema and ecchymoses. Distal pedal pulses are 2+ and equal bilaterally. Neuro: Alert and oriented X 3. Moves all extremities spontaneously. No focal deficits.  Intake/Output: Intake/Output Summary (Last 24 hours) at 10/09/12 0831 Last data filed at 10/09/12 0700  Gross per 24 hour  Intake   1200 ml  Output    950 ml  Net    250 ml    Inpatient Medications:  . antiseptic oral rinse  15 mL Mouth Rinse BID  . aspirin EC  81 mg Oral Daily  . levetiracetam  500 mg Intravenous Q12H  . lisinopril  2.5 mg Oral Daily  . LORazepam  2 mg Intravenous Once  . metoprolol tartrate  25 mg Oral BID  . potassium chloride  20 mEq Oral BID  . sodium chloride  10-40 mL Intracatheter Q12H  . sodium chloride  3 mL Intravenous Q12H   Labs:  Basename 10/08/12 0503 10/07/12 0500  NA 135 135  K 4.6 4.8  CL 104 103  CO2 25 24  GLUCOSE 92 79  BUN 21 19  CREATININE 1.06 1.09  CALCIUM 8.8 9.1  MG -- --  PHOS -- --    Basename 10/08/12 0503 10/07/12 0500  WBC 5.7 6.8  NEUTROABS -- --  HGB 10.2* 10.8*  HCT 31.9* 33.2*  MCV 82.6 81.4  PLT 226 234    Radiology/Studies: Ct Head Wo Contrast  10/04/2012  *RADIOLOGY REPORT*  Clinical Data: Seizure.  Abnormal CT scan.  CT HEAD WITHOUT CONTRAST  Technique:  Contiguous axial images were obtained from the base of the skull through the vertex without contrast.  Comparison: CT of the head without contrast 10/01/2012.  Findings: Linear density within the posterior left sylvian fissure is stable.  Focal hypoattenuation within the medial left temporal lobe is also stable.  No acute cortical infarct, hemorrhage, or mass lesion is present. Ventricles  are of normal size.  Minimal white matter disease is present.  No significant extra-axial fluid collection is present.  The paranasal sinuses and mastoid air cells are clear. Atherosclerotic calcifications are again noted within the cavernous carotid arteries.  IMPRESSION:  1.  Stable appearance of hyperdensity along the left sylvian fissure.  This favors cortical laminar necrosis. 2.  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 3.  No acute intracranial abnormality. 4.  The left sylvian fissure cortical laminar necrosis and / or medial temporal encephalomalacia could certainly serve as a seizure focus.   Original Report Authenticated By: Jamesetta Orleans. MATTERN, M.D.     Telemetry: normal sinus rhythm currently   Assessment and Plan  1. Acute on chronic systolic HF - improved; metoprolol tartrate will be changed to succinate in setting of LV dysfunction for appropriate medical therapy; BP low normal and will follow with this medication adjustment; also on low dose ACEI 2. Atrial fibrillation s/p TEE-guided DCCV - in normal sinus rhythm; continue BB for rate control 3. Ischemic CM, EF 15-20%, single chamber Biotronik ICD in place 4. Seizure disorder - on IV Keppra currently; will check with Neuro regarding transition to PO dose 5. Chronic renal insufficiency - serum Cr stable; ~1.05 since 10/04/2012 6. RUE hematoma - stable 7. 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 He will need factor VII infusions to cover surgical procedures in the future. We can set up office follow up post discharge but my main role in the future will be to work with and advise his other MDs re peri-op factor VII coverage. As you know, this is an extremely expensive product that we must use judiciously."  Dr. Johney Frame to see and make further recommendations. Signed, EDMISTEN, BROOKE PA-C   I have seen, examined the patient, and reviewed the above  assessment and plan.  Changes to above are made where necessary.  Pt has made significant progress.  He has mild postural dizziness and pain over the site of his hematomas.  I anticipate that he will be ready for discharge in 24-48 hours.  He states that he has no place to stay and no one to stay with as his son in law has left town for [place].  I will ask SW to assess.  Co Sign: Hillis Range, MD 10/09/2012 1:59 PM

## 2012-10-09 NOTE — Progress Notes (Signed)
Clinical Social Work-CSW spoke with pt son-in-law who states that he has been in [place] and although he would prefer pt stay until Monday he is ready to accept pt home with 24 hour care tomorrow-CSW provided the option of [facility] SNF however both pt and son-in-law decline-CSW provided contact information to CM and left message for MD-No further needs at this time-CSW signing off- Trula Ore Teruo Stilley-MSW-LCSW-[phone]

## 2012-10-09 NOTE — Progress Notes (Signed)
Utilization Review Completed.  

## 2012-10-09 NOTE — Progress Notes (Signed)
Physical Therapy Treatment Patient Details Name: Louis Ford MRN: [id number] DOB: Mar 15, 1946 Today's Date: 10/09/2012 Time: 4098-1191 PT Time Calculation (min): 21 min  PT Assessment / Plan / Recommendation Comments on Treatment Session  Pt continues to progress.  Continue to recommend RW for home use and will set new goals to progress to ambulation with cane.      Follow Up Recommendations  Home health PT;Supervision/Assistance - 24 hour     Does the patient have the potential to tolerate intense rehabilitation     Barriers to Discharge        Equipment Recommendations  Rolling walker with 5" wheels    Recommendations for Other Services    Frequency Min 3X/week   Plan Discharge plan remains appropriate;Frequency remains appropriate    Precautions / Restrictions Precautions Precautions: Fall Restrictions Weight Bearing Restrictions: No   Pertinent Vitals/Pain VSS, Some pain    Mobility  Bed Mobility Bed Mobility: Not assessed Transfers Transfers: Sit to Stand;Stand to Sit Sit to Stand: 6: Modified independent (Device/Increase time);With upper extremity assist;With armrests;From chair/3-in-1 Stand to Sit: 6: Modified independent (Device/Increase time);With upper extremity assist;With armrests;To chair/3-in-1 Ambulation/Gait Ambulation/Gait Assistance: 6: Modified independent (Device/Increase time) Ambulation Distance (Feet): 600 Feet Assistive device: Rolling walker Ambulation/Gait Assistance Details: Pt. safe using RW.  Wants to use cane at home.  Will continue to progress and try cane next visit.   Gait Pattern: Step-through pattern;Decreased stride length;Trunk flexed Gait velocity: improving Stairs: No Wheelchair Mobility Wheelchair Mobility: No     PT Goals Acute Rehab PT Goals Time For Goal Achievement: 10/16/12 Potential to Achieve Goals: Good Pt will go Sit to Stand: Independently PT Goal: Sit to Stand - Progress: Goal set today Pt will Ambulate: >150  feet;with modified independence;with cane PT Goal: Ambulate - Progress: Goal set today Pt will Go Up / Down Stairs: 1-2 stairs;with modified independence;with least restrictive assistive device PT Goal: Up/Down Stairs - Progress: Goal set today  Visit Information  Last PT Received On: 10/09/12 Assistance Needed: +1    Subjective Data  Subjective: "I just hurt sometimes "   Cognition  Overall Cognitive Status: Appears within functional limits for tasks assessed/performed Arousal/Alertness: Awake/alert Orientation Level: Appears intact for tasks assessed Behavior During Session: [facility] for tasks performed    Balance  Static Standing Balance Static Standing - Balance Support: Bilateral upper extremity supported;During functional activity Static Standing - Level of Assistance: 6: Modified independent (Device/Increase time) Standardized Balance Assessment Standardized Balance Assessment: Berg Balance Test Berg Balance Test Sit to Stand: Able to stand without using hands and stabilize independently Standing Unsupported: Able to stand safely 2 minutes Sitting with Back Unsupported but Feet Supported on Floor or Stool: Able to sit safely and securely 2 minutes Stand to Sit: Sits safely with minimal use of hands Transfers: Able to transfer safely, minor use of hands Standing Unsupported with Eyes Closed: Able to stand 10 seconds safely Standing Ubsupported with Feet Together: Able to place feet together independently and stand 1 minute safely From Standing, Reach Forward with Outstretched Arm: Can reach confidently >25 cm (10") From Standing Position, Pick up Object from Floor: Able to pick up shoe, needs supervision From Standing Position, Turn to Look Behind Over each Shoulder: Looks behind one side only/other side shows less weight shift Turn 360 Degrees: Able to turn 360 degrees safely one side only in 4 seconds or less Standing Unsupported, Alternately Place Feet on Step/Stool: Able to  stand independently and complete 8 steps >20 seconds Standing Unsupported,  One Foot in Front: Able to plae foot ahead of the other independently and hold 30 seconds Standing on One Leg: Able to lift leg independently and hold 5-10 seconds Total Score: 50  High Level Balance High Level Balance Comments: 50/56 on Berg with moderate risk of falls without device.  End of Session PT - End of Session Equipment Utilized During Treatment: Gait belt Activity Tolerance: Patient tolerated treatment well Patient left: in chair;with call bell/phone within reach Nurse Communication: Mobility status        INGOLD,Nazareth Norenberg 10/09/2012, 12:34 PM  [facility] Acute Rehabilitation [phone] (pager)

## 2012-10-09 NOTE — Progress Notes (Signed)
Clinical Social Work Department BRIEF PSYCHOSOCIAL ASSESSMENT 10/09/2012  Patient:  Louis Ford, Louis Ford     Account Number:  [id number]     Admit date:  09/21/2012  Clinical Social Worker:  Conley Simmonds  Date/Time:  10/09/2012 03:00 PM  Referred by:  Physician  Date Referred:  10/09/2012 Referred for  SNF Placement   Other Referral:   Interview type:  Patient Other interview type:    PSYCHOSOCIAL DATA Living Status:  FAMILY Admitted from facility:   Level of care:   Primary support name:  Damon-son in law Primary support relationship to patient:  FAMILY Degree of support available:   strong    CURRENT CONCERNS Current Concerns  Post-Acute Placement   Other Concerns:    SOCIAL WORK ASSESSMENT / PLAN CSW met with pt at bedside to discuss possible d/c planning-CSW reviewed recommendations for SNF and or 24 hour care-CSW reviewed purpose and process of SNF placement and rehab-  Pt goal is to return home however son-inn-law is currently out of town. Pt relayed that he feels son may be able to return tomorrow and requested CSW attempt to contact him-   Assessment/plan status:  Psychosocial Support/Ongoing Assessment of Needs Other assessment/ plan:   Information/referral to community resources:   ST SNF List    PATIENT'S/FAMILY'S RESPONSE TO PLAN OF CARE: Pt seems understanding of SNF process and recommendations however maintains that returning home with son-in-law is his first choice-CSW attempted to contact son to discuss plan and left message requesting call back-  CSW will facilitate d/c planning- Jodean Lima, [phone]

## 2012-10-09 NOTE — Progress Notes (Signed)
1406 Came to walk with pt. Tired from earlier walk. Wants to rest. Encouraged pt to walk with staff later. Luetta Nutting RN

## 2012-10-10 ENCOUNTER — Telehealth: Payer: Self-pay | Admitting: Oncology

## 2012-10-10 ENCOUNTER — Encounter (HOSPITAL_COMMUNITY): Payer: Self-pay | Admitting: Cardiology

## 2012-10-10 LAB — BASIC METABOLIC PANEL
CO2: 25 mEq/L (ref 19–32)
Calcium: 8.5 mg/dL (ref 8.4–10.5)
GFR calc non Af Amer: 69 mL/min — ABNORMAL LOW (ref >90)
Potassium: 4.9 mEq/L (ref 3.5–5.1)
Sodium: 136 mEq/L (ref 135–145)

## 2012-10-10 LAB — CBC
Platelets: 243 10*3/uL (ref 150–400)
RBC: 3.84 MIL/uL — ABNORMAL LOW (ref 4.22–5.81)
WBC: 4.8 10*3/uL (ref 4.0–10.5)

## 2012-10-10 LAB — PROTIME-INR: INR: 1.3 (ref 0.00–1.49)

## 2012-10-10 MED ORDER — POTASSIUM CHLORIDE 20 MEQ PO PACK: 20.0000 meq | PACK | Freq: Every day | ORAL | Status: DC

## 2012-10-10 MED ORDER — METOPROLOL SUCCINATE ER 25 MG PO TB24: 25.0000 mg | ORAL_TABLET | Freq: Every day | ORAL | Status: DC

## 2012-10-10 MED ORDER — FUROSEMIDE 40 MG PO TABS: 40.0000 mg | ORAL_TABLET | Freq: Every day | ORAL | Status: DC

## 2012-10-10 MED ORDER — LISINOPRIL 2.5 MG PO TABS: 2.5000 mg | ORAL_TABLET | Freq: Every day | ORAL | Status: DC

## 2012-10-10 MED ORDER — POTASSIUM CHLORIDE 20 MEQ PO PACK: 20.0000 meq | PACK | Freq: Two times a day (BID) | ORAL | Status: DC

## 2012-10-10 MED ORDER — PHENYTOIN SODIUM EXTENDED 100 MG PO CAPS: 300.0000 mg | ORAL_CAPSULE | Freq: Every day | ORAL | Status: DC

## 2012-10-10 NOTE — Progress Notes (Addendum)
Spoke with Dr. Roseanne Reno, [facility], regarding Louis Ford's treatment with Keppra. Louis Ford has no prescription drug coverage. Please see case management notes. Therefore, he will be unable to afford Keppra. Dr. Roseanne Reno recommended Keppra be discontinued in favor of dilantin. Dr. Roseanne Reno recommended dilantin 300 mg PO daily QHS. Dr. Roseanne Reno stated that this could be done today and does not require further inpatient monitoring.

## 2012-10-10 NOTE — Progress Notes (Signed)
All d/c instructions given to pt and verbalized understanding.  Pt.s relative  Daymund notified him that he is d/c and has to pick up meds.  Verbalized that he will be here after 7p to pick pt up.  Pt made aware.  Amanda Pea, Charity fundraiser.

## 2012-10-10 NOTE — Telephone Encounter (Signed)
C/D 10/10/12 for appt. 10/27/12

## 2012-10-10 NOTE — Progress Notes (Signed)
PT Cancellation Note  Patient Details Name: Louis Ford MRN: [id number] DOB: January 10, 1946   Cancelled Treatment:     Pt deferring PT session at this time.  States he is just waiting for his son-in-law to arrive & that he is ready to go home.  Cont'd to reiterate importance of increasing activity & pt verbalizes he understands.      Verdell Face, [place] [phone] 10/10/2012

## 2012-10-10 NOTE — Discharge Summary (Signed)
CARDIOLOGY DISCHARGE SUMMARY    Patient ID: Louis Ford,  MRN: [id number], DOB/AGE: [birth year] 66 y.o.  Admit date: 09/21/2012 Discharge date: 10/10/2012  Primary Care Physician: Quitman Livings, MD Primary Cardiologist: New to [facility]; [name], MD  Primary Discharge Diagnosis:  1. Acute on chronic systolic HF 2. Atrial fibrillation with RVR s/p TEE-guided DCCV 3. Acute tonsillitis 4. Factor VII deficiency 5. Hematoma, right and left arms 6. Seizure disorder  Secondary Discharge Diagnoses:  1. Ischemic CM, EF 15-20%, single chamber ICD in situ (Biotronik) 3. CAD s/p CABG 4. Chronic renal insufficiency 5. HTN 6. Dyslipidemia  Procedures This Admission:  1. TEE-guided DCCV 09/24/2012 TEE findings - Left Ventricle: Severe LV dysfunction, EF ~ 20%  Mitral Valve: mild MR  Aortic Valve: normal AoV  Tricuspid Valve: moderate TR  Pulmonic Valve: no PI  Left Atrium/ Left atrial appendage: no thrombus, good function  Atrial septum: normal  Aorta: mild plaque  Complications: No apparent complications. Patient tolerated procedure well.  DCCV note - Synchronous cardioversion was performed at 120 joules. The cardioversion was successful.  Complications: No apparent complications. Patient tolerated procedure well.  2. PICC line placement 09/28/2012 3. EEG 10/01/2012  Consultations This Admission: 1. Gastroenterology, Dr. Stan Head 09/26/2012 2. Otolaryngology/ENT, Dr. Illene Silver 09/26/2012 3. Vascular Surgery, Dr. Fabienne Bruns 09/29/2012  4. Neurology, Dr. Caryl Pina 09/30/2012 5. Hematology, Dr. Cephas Darby 10/01/2012 6. 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 He was admitted 09/21/2012 with acute on chronic systolic HF. He was not taking any of his medications as he reports he could not afford them. On admission he was also found to have rapid AF. He was started on IV heparin and Coumadin. He underwent TEE-guided DCCV on 09/24/2012. He developed a sore throat with odynophagia post procedure. Otolaryngology/ENT and Gastroenterology were consulted. Neck and chest CT with pronounced prevertebral soft tissue swelling and left tonsillar enlargement. No abscess noted. He was diagnosed with tonsillitis and peritonsillar cellulitis and was given IV clindamycin. While on anticoagulation, he developed UE hematomas and epistaxis with moderate-sized hematomas involving both arms. 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 Anticoagulation was discontinued. In addition, Mr. Bells had a witnessed seizure on 09/30/2012 with post ictal AMS. There were no ventricular arrhythmias documented on telemetry. Neurology was consulted and ordered a head CT, EEG and IV Keppra. His head CT was abnormal; however, these findings were felt to be chronic and no further imaging was needed. His EEG showed medication effect. His mental status improved. Neurology recommended continuing Keppra. He continued to work with PT. He was provided CHF education. Case management and clinical social work assisted with DC planning and offered SNF placement. Mr. Pressnell and his family declined SNF. His son in-law reported he will have 24-hour care available at home with family. He has been seen, examined and deemed stable for discharge today by Dr. Lewayne Bunting. He will follow-up with Dr. Gala Romney in the HF clinic on 10/30/2012. He will follow-up with Dr. Cyndie Chime, Hematology, on 10/27/2012. He will follow-up with Dr. Roseanne Reno in 3-4 weeks. He will see  Dr. Ladona Ridgel in 3-4 weeks.  Discharge Vitals: Blood pressure 125/74, pulse 115, temperature 98.2 F (36.8 C), temperature source Oral, resp. rate 18, height 5\' 11"  (1.803 m), weight 198 lb 3.1 oz (89.9 kg), SpO2 100.00%.   Labs: Lab Results  Component Value Date   WBC 4.8 10/10/2012   HGB 10.3* 10/10/2012   HCT 31.5* 10/10/2012   MCV 82.0 10/10/2012   PLT 243 10/10/2012     Lab 10/10/12 0500  NA 136  K 4.9  CL 105  CO2 25  BUN 20  CREATININE 1.11  CALCIUM 8.5  PROT --  BILITOT --  ALKPHOS --  ALT --  AST --  GLUCOSE 70   Lab Results  Component Value Date   CKTOTAL 25 10/09/2012   CKMB 6.3* 09/22/2012   TROPONINI <0.30 09/22/2012    Lab Results  Component Value Date   CHOL 133 09/22/2012   Lab Results  Component Value Date   HDL 37* 09/22/2012   Lab Results  Component Value Date   LDLCALC 89 09/22/2012   Lab Results  Component Value Date   TRIG 35 09/22/2012   Lab Results  Component Value Date   CHOLHDL 3.6 09/22/2012   Lab Results  Component Value Date   DDIMER 0.84* 09/21/2012    Basename 10/10/12 0500  INR 1.30    Disposition:  The patient is being discharged in stable condition.  Follow-up:     Follow-up Information    Follow up with [facility]. On 10/30/2012. (At 11:00 AM to establish HF care)    Contact information:   Dr. Adine [facility] [address] [place] [address] [phone]      Follow up with Lewayne Bunting, MD. On 11/10/2012. (At 11:45 AM for hospital follow-up)    Contact information:   [facility] [address] [place] [address] [phone]      Follow up with Levert Feinstein, MD. On 10/27/2012. (At 12:00 noon for hospital follow-up)    Contact information:   Dr. Jonetta [facility] [address] [place] [address] [phone]       Follow up with [facility], MD. On 11/14/2012. (At 10:45 AM to establish care)    Contact  information:   Dr. Wynonia Sours [facility] [address] [place]. [address] [place] [address] [phone]        Discharge Medications:    Medication List     As of 10/10/2012  3:06 PM    TAKE these medications         furosemide 40 MG tablet   Commonly known as: LASIX   Take 1 tablet (40 mg total) by mouth daily.      lisinopril 2.5 MG tablet   Commonly known as: PRINIVIL,ZESTRIL   Take 1 tablet (2.5 mg total) by mouth daily.      metoprolol succinate 25 MG 24 hr tablet   Commonly known as: TOPROL-XL   Take 1 tablet (25 mg total) by mouth daily.      phenytoin 100 MG ER capsule   Commonly known as: DILANTIN   Take 3 capsules (300 mg total) by mouth daily. At bedtime.      potassium chloride 20 MEQ packet   Commonly known as: KLOR-CON   Take 20 mEq by mouth daily.        Duration of Discharge Encounter: Greater than 30 minutes including physician time.  Signed, Rick Duff, PA-C 10/10/2012, 3:06  PM

## 2012-10-10 NOTE — Progress Notes (Signed)
[id number] Gave pt CHF booklet and reviewed green,yellow, and red zones. Pt states he has scales at home. Discussed importance of weighing daily and notifying MD of weight gain 3lbs overnight. Pt could benefit from reenforcement. Seems down today. Declined walk. States does not feel up to it. Discussed importance of walking daily at home to increase strength. Pt states he is ready to go home. Worried about financial concerns. Gwendolyn Nishi DunlapRN

## 2012-10-10 NOTE — Progress Notes (Signed)
10/18 1030 Tera Mater, RN, BSN NCM [phone] In to speak with pt. about [facility] services again, and pt. stated he would be agreeable now.  After looking at [facility] list of agenices, pt. chose [facility].  TC to [facility], with [facility], to give referral for [facility] PT and [facility] CSW.  Pt. is concerned about going home and about affording his medications.  Checked on pt. prescription coverage with his insurance, and he does not have any prescription coverage.  For his Keppra 500mg  po BID dose would be $197.99 for 1 month.  Physician, please write for pt. medications from the [facility] $4 list if possible.  Pt. will be dc home today with son in law.

## 2012-10-10 NOTE — Progress Notes (Signed)
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 He was admitted 09/21/2012 with acute on chronic systolic HF. He was not taking any of his medications as he reports he could not afford them. On admission he was also found to have rapid AF. He was started on IV heparin and Coumadin. He underwent TEE-guided DCCV on 09/24/2012. He developed tonsillitis and peritonsillar cellulitis and was given IV clindamycin. While on anticoagulation, he developed a large RUE hematoma. 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 Anticoagulation was discontinued. In addition, Louis Ford had a seizure on 09/30/2012. There were no ventricular arrhythmias documented on telemetry. Neurology was consulted and ordered a head CT, EEG and IV Keppra. His head CT was abnormal; however, these findings were felt to be chronic and no further imaging was needed. His EEG showed medication effect. Neurology recommended continuing Keppra.  This AM, Louis Ford reports soreness in both arms. He denies CP. He reports DOE which he has noticed is "about the same" with daily PT exercises. He reports dizziness with postural changes.   Objective  Physical Exam: Vitals: BP 116/63  Pulse 67  Temp 97.2 F (36.2 C) (Oral)  Resp 18  Ht 5\' 11"  (1.803 m)  Wt 198 lb 3.1 oz (89.9 kg)  BMI 27.64 kg/m2  SpO2 100% General: Well developed 66 year old male in no acute distress. He is sitting comfortably in the bedside chair.  Neck: Supple. JVD not elevated.  Lungs:  Clear bilaterally to auscultation without wheezes, rales, or rhonchi. Breathing is unlabored. No wheezes, rales or rhonchi.  Heart: RRR S1 S2 with low pitched II/VI systolic murmur along LSB. No rub or gallop.  Abdomen: Soft, non-distended.  Extremities: No clubbing or cyanosis. No pedal edema. Right forearm with mild edema and ecchymoses. Left arm with mild edema, ecchymoses and tenderness to palpation. Edema noted at PICC insertion. Distal pedal pulses are 2+ and equal bilaterally.  Neuro: Alert and oriented X 3. Moves all extremities spontaneously. No focal deficits.  Intake/Output: Intake/Output Summary (Last 24 hours) at 10/10/12 0929 Last data filed at 10/10/12 0854  Gross per 24 hour  Intake 1217.83 ml  Output   1600 ml  Net -382.17 ml   Inpatient Medications:  . antiseptic oral rinse  15 mL Mouth Rinse BID  . aspirin EC  81 mg Oral Daily  . levetiracetam  500 mg Intravenous Q12H  . lisinopril  2.5 mg Oral Daily  . LORazepam  2 mg Intravenous Once  . metoprolol succinate  25 mg Oral Daily  . potassium chloride  20 mEq Oral BID  . sodium chloride  10-40 mL Intracatheter Q12H  . sodium chloride  3 mL Intravenous Q12H  . DISCONTD: metoprolol tartrate  25 mg Oral BID   Labs:  Basename 10/10/12 0500 10/08/12 0503  NA 136 135  K 4.9 4.6  CL 105 104  CO2 25 25  GLUCOSE 70 92  BUN 20 21  CREATININE 1.11 1.06  CALCIUM 8.5 8.8  MG -- --  PHOS -- --  Basename 10/10/12 0500 10/08/12 0503  WBC 4.8 5.7  NEUTROABS -- --  HGB 10.3* 10.2*  HCT 31.5* 31.9*  MCV 82.0 82.6  PLT 243 226    Radiology/Studies: Ct Head Wo Contrast  10/04/2012  *RADIOLOGY REPORT*  Clinical Data: Seizure.  Abnormal CT scan.  CT HEAD WITHOUT CONTRAST  Technique:  Contiguous axial images were obtained from the base of the skull through the vertex without contrast.  Comparison: CT of the head without contrast 10/01/2012.  Findings: Linear density within the posterior left sylvian fissure is stable.   Focal hypoattenuation within the medial left temporal lobe is also stable.  No acute cortical infarct, hemorrhage, or mass lesion is present. Ventricles are of normal size.  Minimal white matter disease is present.  No significant extra-axial fluid collection is present.  The paranasal sinuses and mastoid air cells are clear. Atherosclerotic calcifications are again noted within the cavernous carotid arteries.  IMPRESSION:  1.  Stable appearance of hyperdensity along the left sylvian fissure.  This favors cortical laminar necrosis. 2.  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 3.  No acute intracranial abnormality. 4.  The left sylvian fissure cortical laminar necrosis and / or medial temporal encephalomalacia could certainly serve as a seizure focus.   Original Report Authenticated By: Jamesetta Orleans. MATTERN, M.D.    Ct Head Wo Contrast  10/01/2012  *RADIOLOGY REPORT*  Clinical Data: 66 year old male altered mental status and seizure.  CT HEAD WITHOUT CONTRAST  Technique:  Contiguous axial images were obtained from the base of the skull through the vertex without contrast.  Comparison: 0047 hours the same day.  Findings: Stable paranasal sinuses and mastoids. Stable visualized osseous structures.  No scalp hematoma identified.  Calcified atherosclerosis at the skull base.  Asymmetry of the left sylvian fissure persists and is unchanged. The finding consists of both cortically based hyperdensity as well as asymmetric volume loss compared to the opposite side.  A sub insular hypodensity also noted.  Stable left MCA M2 branch which occurs in this region.  As before, I think the findings probably are unchanged since 09/26/2012.  No convincing intracranial hemorrhage identified.  No ventriculomegaly.  Stable gray-white matter differentiation elsewhere.  No acute cortically based infarct identified.  IMPRESSION: 1.  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  In light of the patient's ongoing anticoagulation for atrial fibrillation, I recommend one additional follow-up noncontrast head CT to ensure stability in 48 hours, unless clinically indicated sooner. 2.  No new intracranial findings.   Original Report Authenticated By: Harley Hallmark, M.D.     Telemetry: normal sinus rhythm with occasional PVCs/PACs   Assessment and Plan  1. Acute on chronic systolic HF - improved; metoprolol tartrate was changed to succinate in setting of LV dysfunction for appropriate medical therapy; BP low normal and will follow with this medication adjustment; also on low dose ACEI  2. Atrial fibrillation s/p TEE-guided DCCV - in normal sinus rhythm; continue BB for rate control  3. Ischemic CM, EF 15-20%, single chamber Biotronik ICD in place  4. Seizure disorder - on IV Keppra currently; will check with Neuro regarding transition to PO dose and will check with his pharmacy to determine pricing as he has difficulty affording his medications 5. Chronic renal insufficiency - serum Cr stable; ~1.05 since 10/04/2012  6. Right forearm hematoma, hematoma at PICC insertion site - stable on right; ? worsening edema on  left at PICC site and distally; ? need for doppler US  7. 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 He will need factor VII infusions to cover surgical procedures in the future. We can set up office follow up post discharge but my main role in the future will be to work with and advise his other MDs re peri-op factor VII coverage. As you know, this is an extremely expensive product that we must use judiciously."   Case management will assess for home health needs today. Possible DC to home with son in-law this afternoon pending evaluation by Dr. Ladona Ridgel. Dr. Ladona Ridgel to see and make further recommendations. Signed, Exie Parody  Cardiology Attending  Patient  seen and examined. He is stable for discharge with his son-in-law. He will need followup with our heart failure team. Continue current meds.  Lewayne Bunting, M.D.

## 2012-10-10 NOTE — Progress Notes (Signed)
[facility]  Patient Status: New  [facility] is providing the following services: RN  If patient discharges after hours, please call [phone].   Louis Ford 10/10/2012, 1:47 PM

## 2012-10-11 LAB — FACTOR 7 ASSAY: Factor VII Activity: 60 % (ref 60–150)

## 2012-10-12 ENCOUNTER — Other Ambulatory Visit: Payer: Self-pay | Admitting: Oncology

## 2012-10-12 DIAGNOSIS — D682 Hereditary deficiency of other clotting factors: Secondary | ICD-10-CM

## 2012-10-12 DIAGNOSIS — D689 Coagulation defect, unspecified: Secondary | ICD-10-CM

## 2012-10-27 ENCOUNTER — Ambulatory Visit: Payer: Medicare Other

## 2012-10-27 ENCOUNTER — Inpatient Hospital Stay: Payer: Medicare Other | Admitting: Oncology

## 2012-10-27 ENCOUNTER — Encounter: Payer: Self-pay | Admitting: Oncology

## 2012-10-27 ENCOUNTER — Other Ambulatory Visit: Payer: Medicare Other | Admitting: Lab

## 2012-10-27 NOTE — Progress Notes (Signed)
66 year old man who moved here from [place]. I recently saw him in consultation at [facility]. He has an end-stage cardiomyopathy. He has an implantable defibrillator/pacemaker. He had problems with recurrent postoperative hemorrhage into the area of the pacemaker battery in the subcutaneous tissues when he was up in [place] requiring multiple hospitalizations. 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 Detailed records were not available immediately when he was hospitalized here. He was initially anticoagulated when he came in with an acute coronary syndrome. This included both heparin and Coumadin. He began to develop spontaneous bleeding into his right arm and then an acute pharyngitis which was initially felt to be an infection but in retrospect was likely a pharyngeal wall hematoma related to a transesophageal echocardiogram procedure. I was asked to see the patient in consultation. He initially had an elevation of both pro time and PTT. Interpretation complicated by the fact that he was on anticoagulation. I obtained plasma mixing studies and this did not show the presence of an inhibitor. I obtained an initial factor VII level when he was off Coumadin for just 48 hours and this was significantly decreased at 33% of control. I obtained a repeat factor VII level off Coumadin for one week and this was low but improved at 60% of control of control range 60-150 (10/09/2012). A von Willebrand factor antigen was normal at 192% of control and clotting factor VIII was normal at 276%. (Both high normal as a result of acute phase reactivity).  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 Although it is mild by the numbers,  clinically he has gotten into trouble on repetitive occasions with bleeding.  My recommendation was that to cover her subsequent surgical procedures he should receive recombinant factor VII depending on the procedure at a dose between 40-80  mcg per kilogram IV push prior to the procedure and then on a when necessary basis.  He failed to report for the visit today.  [place] copy of report to Dr. Sherryl Manges; Dr. Isidoro Donning; Dr. Scherrie Merritts

## 2012-10-30 ENCOUNTER — Encounter (HOSPITAL_COMMUNITY): Payer: Medicare Other

## 2012-10-31 ENCOUNTER — Telehealth: Payer: Self-pay | Admitting: Oncology

## 2012-10-31 NOTE — Telephone Encounter (Signed)
S/W pt in re [place] f/u appt 11/29 @ 11 w/Dr. Cyndie Chime Welcome packet mailed.

## 2012-10-31 NOTE — Telephone Encounter (Signed)
LVOM for pt to return call.  °

## 2012-11-04 ENCOUNTER — Ambulatory Visit (HOSPITAL_COMMUNITY): Payer: PRIVATE HEALTH INSURANCE

## 2012-11-06 ENCOUNTER — Ambulatory Visit (HOSPITAL_COMMUNITY)
Admission: RE | Admit: 2012-11-06 | Discharge: 2012-11-06 | Disposition: A | Discharge status: Home / Self Care | Payer: PRIVATE HEALTH INSURANCE | Source: Ambulatory Visit | Attending: Internal Medicine | Admitting: Internal Medicine

## 2012-11-06 VITALS — BP 116/78 | HR 120 | Wt 222.0 lb

## 2012-11-06 DIAGNOSIS — I5023 Acute on chronic systolic (congestive) heart failure: Secondary | ICD-10-CM | POA: Insufficient documentation

## 2012-11-06 DIAGNOSIS — I5022 Chronic systolic (congestive) heart failure: Secondary | ICD-10-CM

## 2012-11-06 DIAGNOSIS — I4891 Unspecified atrial fibrillation: Secondary | ICD-10-CM | POA: Insufficient documentation

## 2012-11-06 MED ORDER — METOPROLOL SUCCINATE ER 25 MG PO TB24: 25.0000 mg | ORAL_TABLET | Freq: Two times a day (BID) | ORAL | Status: DC

## 2012-11-06 NOTE — Assessment & Plan Note (Deleted)
NYHA III. Volume status elevated. Weight up 24 pounds form discharge. Increase lasix to 40 mg po bid. He will also take Metolazone 2.5 mg today and tomorrow. Follow up on Monday.

## 2012-11-06 NOTE — Patient Instructions (Addendum)
Take Metoprolol XL 25 mg twice a day  Take Lasix 40 mg twice a day  Take Metolazone 2.5 mg today and tomorrow   Follow up  On Monday   Do the following things EVERYDAY: 1) Weigh yourself in the morning before breakfast. Write it down and keep it in a log. 2) Take your medicines as prescribed 3) Eat low salt foods-Limit salt (sodium) to 2000 mg per day.  4) Stay as active as you can everyday 5) Limit all fluids for the day to less than 2 liters

## 2012-11-06 NOTE — Progress Notes (Signed)
Patient ID: Louis Ford, male   DOB: 06/15/1946, 66 y.o.   MRN: [id number] EP: Dr Ladona Ridgel Hematologist: Dr Cyndie Chime  HPI: Louis Ford is a pleasant 66 year old man with an ischemic cardiomyopathy, chronic systolic HF s/p ICD Biotronik implant in 2006, s/p generator change June 2012, CAD, HTN, dyslipidemia, prior CVA, A fib, Seizure disorder, and Factor VII deficiency who recently relocated to [place] from [place]. Quit smoking a pipe 2008. Quit drinking alcohol 2008.    He was admitted to [facility] 09/21/2012 with acute on chronic systolic HF. On admission he was also found to have rapid AF. He was started on IV heparin and Coumadin.but later developed upper extremity hematomas and epitaxis. Anticoagulants stopped and hematology was consulted. He underwent TEE-guided and successful DCCV on 09/24/2012. He was diuresed with IV lasix . ECHO EF 15-20%. Also had a seizure and he was later started on Keppra. Discharge weight 198 pounds.   He presents to the HF clinic at the request of Dr Ladona Ridgel for ongoing heart failure management. Has increasing dyspnea with exertion. Weight up to 222. + Orthopnea Sleeps on 2 pillows.  Denies PND/CP. Compliant with medications. Wears compression stockings. Denies bleeding problems. Does not drink alcohol or smoke.     Review of Systems:     Cardiac Review of Systems: {Y] = yes [ ]  = no  Chest Pain [    ]  Resting SOB [   ] Exertional SOB  [Y  ]  Louis Ford  ]   Pedal Edema [   ]    Palpitations [  ] Syncope  [  ]   Presyncope [   ]  General Review of Systems: [Y] = yes [  ]=no Constitional: recent weight change [  ]; anorexia [  ]; fatigue [ Y ]; nausea [  ]; night sweats [  ]; fever [  ]; or chills [  ];                                                                                                                                          Dental: poor dentition[  ]; Last Dentist visit:   Eye : blurred vision [  ]; diplopia [   ]; vision changes [  ];  Amaurosis fugax[   ]; Resp: cough [  ];  wheezing[  ];  hemoptysis[  ]; shortness of breath[  ]; paroxysmal nocturnal dyspnea[  ]; dyspnea on exertion[ Y ]; or orthopnea[  ];  GI:  gallstones[  ], vomiting[  ];  dysphagia[  ]; melena[  ];  hematochezia [  ]; heartburn[  ];   Hx of  Colonoscopy[  ]; GU: kidney stones [  ]; hematuria[  ];   dysuria [  ];  nocturia[  ];  history of     obstruction [  ];  Skin: rash, swelling[  ];, hair loss[  ];  peripheral edema[  ];  or itching[  ]; Musculosketetal: myalgias[  ];  joint swelling[  ];  joint erythema[  ];  joint pain[  ];  back pain[  ];  Heme/Lymph: bruising[  ];  bleeding[Y  ];  anemia[  ];  Neuro: TIA[  ];  headaches[  ];  stroke[  Y;  vertigo[  ];  seizures[  ];   paresthesias[  ];  difficulty walking[  ];  Psych:depression[  ]; anxiety[  ];  Endocrine: diabetes[  ];  thyroid dysfunction[  ];  Immunizations: Flu [  ]; Pneumococcal[  ];  Other:    Past Medical History  Diagnosis Date  . CAD (coronary artery disease)   . Chronic systolic heart failure   . Ischemic cardiomyopathy     EF 15 to 20% by TTE and TEE in 09/2012.  Severe LV dysfunction  . HTN (hypertension)   . Hyperlipidemia   . Obesity     BMI 31 in 09/2012  . Atrial fibrillation   09/22/2012  . implantable cardiac defibrillator-Biotronik     Device Implanted 2006; s/p gen change 03/2011 : bleeding persistent with pocket erosion and infection; explant and reimplant  06/2011  . Stroke   . 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 History of MRSA infection 05/2011  . Seizure disorder latest 09/30/2012  . 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    Current Outpatient Prescriptions  Medication Sig Dispense Refill  . furosemide (LASIX) 40 MG tablet Take 1 tablet (40 mg total) by mouth daily.  30 tablet  6  . lisinopril (PRINIVIL,ZESTRIL) 2.5 MG tablet Take 1 tablet (2.5 mg total) by mouth daily.  30 tablet  6  . metoprolol succinate (TOPROL-XL) 25 MG 24 hr tablet Take 1 tablet (25 mg total) by  mouth daily.  30 tablet  6  . phenytoin (DILANTIN) 100 MG ER capsule Take 3 capsules (300 mg total) by mouth daily. At bedtime.  90 capsule  2  . potassium chloride (KLOR-CON) 20 MEQ packet Take 20 mEq by mouth daily.  30 tablet  6     No Known Allergies  History   Social History  . Marital Status: Single    Spouse Name: N/A    Number of Children: N/A  . Years of Education: N/A   Occupational History  . Not on file.   Social History Main Topics  . Smoking status: Former Smoker    Types: Pipe    Quit date: 09/21/2008  . Smokeless tobacco: Former Neurosurgeon    Quit date: 09/21/2008  . Alcohol Use: No  . Drug Use: No  . Sexually Active: No   Other Topics Concern  . Not on file   Social History Narrative  . No narrative on file     PHYSICAL EXAM: Filed Vitals:   11/06/12 1523  BP: 116/78  Pulse: 120  Weight 222 pounds ( 198 discharge weight) General:  Chronically ill appearing. No respiratory difficulty HEENT: normal Neck: supple. JVD to jaw  Carotids 2+ bilat; no bruits. No lymphadenopathy or thryomegaly appreciated. Cor: PMI nondisplaced. Irregular Regular rate & rhythm. No rubs, gallops or murmurs. Lungs: clear Abdomen: soft, nontender, nondistended. No hepatosplenomegaly. No bruits or masses. Good bowel sounds. Extremities: no cyanosis, clubbing, rash, RUE edema R and LLE 2+ edema. Ted hose in place.  Neuro: alert & oriented x 3, cranial nerves grossly intact. moves all 4 extremities w/o difficulty. Affect pleasant.  ECG:  A flutter 104    ASSESSMENT & PLAN:

## 2012-11-06 NOTE — Assessment & Plan Note (Addendum)
Back in A Fib/flutter. Rate mildly elevated. Not on anti-coagulation due to recent bleeding. Will increase lopressor to 25 bid. He will f/u with Dr. Ladona Ridgel on Monday.

## 2012-11-06 NOTE — Assessment & Plan Note (Signed)
Volume status is significantly elevated. 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 He prefers not to be admitted if possible. We will increase lasix to 40 bid and add metolazone for 2 days. We will call him tomorrow to see how he is doing and see him back on Monday. If not improving, he will need to be readmitted.

## 2012-11-10 ENCOUNTER — Ambulatory Visit (HOSPITAL_COMMUNITY)
Admission: RE | Admit: 2012-11-10 | Discharge: 2012-11-10 | Disposition: A | Discharge status: Home / Self Care | Payer: PRIVATE HEALTH INSURANCE | Source: Ambulatory Visit | Attending: Internal Medicine | Admitting: Internal Medicine

## 2012-11-10 ENCOUNTER — Ambulatory Visit (INDEPENDENT_AMBULATORY_CARE_PROVIDER_SITE_OTHER): Payer: Medicare Other | Admitting: Internal Medicine

## 2012-11-10 ENCOUNTER — Encounter: Payer: Self-pay | Admitting: Internal Medicine

## 2012-11-10 ENCOUNTER — Ambulatory Visit (INDEPENDENT_AMBULATORY_CARE_PROVIDER_SITE_OTHER): Payer: Medicare Other | Admitting: *Deleted

## 2012-11-10 VITALS — BP 90/70 | HR 51 | Ht 70.0 in | Wt 217.6 lb

## 2012-11-10 VITALS — HR 118 | Wt 218.5 lb

## 2012-11-10 DIAGNOSIS — I5022 Chronic systolic (congestive) heart failure: Secondary | ICD-10-CM

## 2012-11-10 DIAGNOSIS — I5023 Acute on chronic systolic (congestive) heart failure: Secondary | ICD-10-CM

## 2012-11-10 DIAGNOSIS — I4891 Unspecified atrial fibrillation: Secondary | ICD-10-CM

## 2012-11-10 DIAGNOSIS — Z95810 Presence of automatic (implantable) cardiac defibrillator: Secondary | ICD-10-CM

## 2012-11-10 DIAGNOSIS — R0989 Other specified symptoms and signs involving the circulatory and respiratory systems: Secondary | ICD-10-CM

## 2012-11-10 DIAGNOSIS — I10 Essential (primary) hypertension: Secondary | ICD-10-CM

## 2012-11-10 DIAGNOSIS — I2510 Atherosclerotic heart disease of native coronary artery without angina pectoris: Secondary | ICD-10-CM

## 2012-11-10 LAB — BASIC METABOLIC PANEL
CO2: 30 mEq/L (ref 19–32)
Chloride: 96 mEq/L (ref 96–112)
Sodium: 136 mEq/L (ref 135–145)

## 2012-11-10 NOTE — Assessment & Plan Note (Addendum)
Weight remains up but no significant volume overload on exam. Continue lasix at 40 bid. Will need labs today and in 1 week. Will see Dr. Ladona Ridgel today to readdress AF/AFL. We will provide him with a scale today and 30 days of his HF meds. Reinforced need for daily weights and reviewed use of sliding scale diuretics. Check labs today and in 1 week. F/u 3 weeks.

## 2012-11-10 NOTE — Addendum Note (Signed)
Encounter addended by: Noralee Space, RN on: 11/10/2012  3:21 PM<BR>     Documentation filed: Patient Instructions Section

## 2012-11-10 NOTE — Assessment & Plan Note (Signed)
His Biotronik ICD has been interrogated today and is working normally. He has had no recent ICD shocks.

## 2012-11-10 NOTE — Progress Notes (Signed)
HPI Louis Ford for today by Dr. Teressa Lower for ongoing evaluation and management of his ICD as well as treatment of his atrial fibrillation and flutter. The patient has a long-standing ischemic cardiomyopathy, status post ICD implantation, and another facility. He underwent extraction after a generator change out resulted in infection. He has had no recent ICD shocks. The patient was hospitalized several weeks ago with worsening heart failure and atrial fibrillation and flutter. He saw Dr. Teressa Lower after this and had gone back into atrial fibrillation. His dose of beta blocker was up titrated and he returns today for followup. He has class III heart failure symptoms. He can walk approximately 2 blocks and has to stop and rest. He struggles and walking in general particularly on an incline. He denies peripheral edema. He has had no recent ICD shocks. He does not have palpitations. No Known Allergies   Current Outpatient Prescriptions  Medication Sig Dispense Refill  . furosemide (LASIX) 40 MG tablet Take 40 mg by mouth 2 (two) times daily.      [name] lisinopril (PRINIVIL,ZESTRIL) 2.5 MG tablet Take 1 tablet (2.5 mg total) by mouth daily.  30 tablet  6  . metoprolol succinate (TOPROL-XL) 25 MG 24 hr tablet Take 1 tablet (25 mg total) by mouth 2 (two) times daily.  60 tablet  6  . phenytoin (DILANTIN) 100 MG ER capsule Take 3 capsules (300 mg total) by mouth daily. At bedtime.  90 capsule  2  . potassium chloride (KLOR-CON) 20 MEQ packet Take 20 mEq by mouth daily.  30 tablet  6  . [DISCONTINUED] furosemide (LASIX) 40 MG tablet Take 1 tablet (40 mg total) by mouth daily.  30 tablet  6     Past Medical History  Diagnosis Date  . CAD (coronary artery disease)   . Chronic systolic heart failure   . Ischemic cardiomyopathy     EF 15 to 20% by TTE and TEE in 09/2012.  Severe LV dysfunction  . HTN (hypertension)   . Hyperlipidemia   . Obesity     BMI 31 in 09/2012  . Atrial fibrillation   09/22/2012  .  implantable cardiac defibrillator-Biotronik     Device Implanted 2006; s/p gen change 03/2011 : bleeding persistent with pocket erosion and infection; explant and reimplant  06/2011  . Stroke   . 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 History of MRSA infection 05/2011  . Seizure disorder latest 09/30/2012  . 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   Past Surgical History  Procedure Date  . Coronary artery bypass graft   . Tee without cardioversion 09/24/2012    Procedure: TRANSESOPHAGEAL ECHOCARDIOGRAM (TEE);  Surgeon: Vesta Mixer, MD;  Location: [facility] ENDOSCOPY;  Service: Cardiovascular;  Laterality: N/A;  dave/anesth, dl, cindy/echo   . Cardioversion 09/24/2012    Procedure: CARDIOVERSION;  Surgeon: Vesta Mixer, MD;  Location: [facility] ENDOSCOPY;  Service: Cardiovascular;  Laterality: N/A;  . Icd      No family history on file.   History   Social History  . Marital Status: Single    Spouse Name: N/A    Number of Children: N/A  . Years of Education: N/A   Occupational History  . Not on file.   Social History Main Topics  . Smoking status: Former Smoker    Types: Pipe    Quit date: 09/21/2008  . Smokeless tobacco: Former Neurosurgeon  Quit date: 09/21/2008  . Alcohol Use: No  . Drug Use: No  . Sexually Active: No   Other Topics Concern  . Not on file   Social History Narrative  . No narrative on file     BP 90/70  Pulse 51  Ht 5\' 10"  (1.778 m)  Wt 217 lb 9.6 oz (98.703 kg)  BMI 31.22 kg/m2  SpO2 90%  Physical Exam:  Well appearing middle-aged man, NAD HEENT: Unremarkable Neck:  7 cm JVD, no thyromegally Lungs:  Clear except for basilar rales. No wheezes or rhonchi. HEART:  Regular rate rhythm, no murmurs, no rubs, no clicks Abd:  soft, positive bowel sounds, no organomegally, no rebound, no guarding Ext:  2 plus pulses, no edema, no cyanosis, no clubbing Skin:  No rashes no nodules Neuro:  CN II  through XII intact, motor grossly intact  DEVICE  Normal device function.  See PaceArt for details.   Assess/Plan:

## 2012-11-10 NOTE — Assessment & Plan Note (Signed)
The patient appears to be maintaining sinus rhythm. We discussed the possibility of initiation of amiodarone therapy, but for now I think a period of watchful waiting is indicated.

## 2012-11-10 NOTE — Patient Instructions (Signed)
Your physician wants you to follow-up in: 12 months with Dr. Taylor. You will receive a reminder letter in the mail two months in advance. If you don't receive a letter, please call our office to schedule the follow-up appointment.    

## 2012-11-10 NOTE — Patient Instructions (Addendum)
Follow up 3 weeks.  Continue taking medications as prescribed.  Have labs drawn next week with Dr.Taylors office.

## 2012-11-10 NOTE — Assessment & Plan Note (Signed)
He currently denies anginal symptoms. He'll continue his current medical therapy and I've encouraged the patient to increase his physical activity. He will use nitroglycerin as needed.

## 2012-11-10 NOTE — Assessment & Plan Note (Signed)
His acute on chronic systolic heart failure is currently compensated. I discuss importance a low-sodium diet. He will followup in our heart failure clinic. No change in medical therapy at this time.

## 2012-11-10 NOTE — Assessment & Plan Note (Signed)
Per Dr.Taylor  

## 2012-11-10 NOTE — Addendum Note (Signed)
Encounter addended by: Theresia Bough, CMA on: 11/10/2012  3:32 PM<BR>     Documentation filed: Patient Instructions Section, Orders

## 2012-11-10 NOTE — Progress Notes (Signed)
EP: Dr Ladona Ridgel Hematologist: Dr Cyndie Chime  HPI: Mr. Spong is a pleasant 66 year old man with an ischemic cardiomyopathy, chronic systolic HF s/p ICD Biotronik implant in 2006, s/p generator change June 2012, CAD, HTN, dyslipidemia, prior CVA, A fib, Seizure disorder, and Factor VII deficiency who recently relocated to [place] from [place]. Quit smoking a pipe 2008. Quit drinking alcohol 2008.    He was admitted to [facility] 09/21/2012 with acute on chronic systolic HF. On admission he was also found to have rapid AF. He was started on IV heparin and Coumadin.but later developed upper extremity hematomas and epitaxis. Anticoagulants stopped and hematology was consulted. He underwent TEE-guided and successful DCCV on 09/24/2012. He was diuresed with IV lasix . ECHO EF 15-20%. Also had a seizure and he was later started on Keppra. Discharge weight 198 pounds.   Returns for 4 day follow up.  We saw him last week and weight up to 222. His lasix increased 40 mg BID and metolazone given for 2 days. Weight down to 218. He was noted to be in Afib/flutter and lopressor increased 25 mg BID.  He will follow up with Dr. Ladona Ridgel today.  He feels ok.  Gets short of breath after walking 2 blocks and has to stop.  2 pillow orthopnea.  No PND or edema.  No chest pain.  He is compliant with meds.  No scale at home.     Past Medical History  Diagnosis Date  . CAD (coronary artery disease)   . Chronic systolic heart failure   . Ischemic cardiomyopathy     EF 15 to 20% by TTE and TEE in 09/2012.  Severe LV dysfunction  . HTN (hypertension)   . Hyperlipidemia   . Obesity     BMI 31 in 09/2012  . Atrial fibrillation   09/22/2012  . implantable cardiac defibrillator-Biotronik     Device Implanted 2006; s/p gen change 03/2011 : bleeding persistent with pocket erosion and infection; explant and reimplant  06/2011  . Stroke   . 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 History of MRSA infection 05/2011  . Seizure disorder latest  09/30/2012  . 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    Current Outpatient Prescriptions  Medication Sig Dispense Refill  . furosemide (LASIX) 40 MG tablet Take 40 mg by mouth 2 (two) times daily.      [name] lisinopril (PRINIVIL,ZESTRIL) 2.5 MG tablet Take 1 tablet (2.5 mg total) by mouth daily.  30 tablet  6  . metoprolol succinate (TOPROL-XL) 25 MG 24 hr tablet Take 1 tablet (25 mg total) by mouth 2 (two) times daily.  60 tablet  6  . phenytoin (DILANTIN) 100 MG ER capsule Take 3 capsules (300 mg total) by mouth daily. At bedtime.  90 capsule  2  . potassium chloride (KLOR-CON) 20 MEQ packet Take 20 mEq by mouth daily.  30 tablet  6  . [DISCONTINUED] furosemide (LASIX) 40 MG tablet Take 1 tablet (40 mg total) by mouth daily.  30 tablet  6     No Known Allergies  History   Social History  . Marital Status: Single    Spouse Name: N/A    Number of Children: N/A  . Years of Education: N/A   Occupational History  . Not on file.   Social History Main Topics  . Smoking status: Former Smoker    Types: Pipe    Quit date: 09/21/2008  . Smokeless tobacco: Former Neurosurgeon    Quit date: 09/21/2008  .  Alcohol Use: No  . Drug Use: No  . Sexually Active: No   Other Topics Concern  . Not on file   Social History Narrative  . No narrative on file     PHYSICAL EXAM: Filed Vitals:   11/10/12 1405  Pulse: 118  Weight: 218 lb 8 oz (99.111 kg)  SpO2: 97%    General: Looks good. No respiratory difficulty HEENT: normal Neck: supple. JVD 7-8  Carotids 2+ bilat; no bruits. No lymphadenopathy or thryomegaly appreciated. Cor: PMI nondisplaced. Irregular Regular rate & rhythm. No rubs, gallops or murmurs. Lungs: clear Abdomen: soft, nontender, nondistended. No hepatosplenomegaly. No bruits or masses. Good bowel sounds. Extremities: no cyanosis, clubbing, rash, Trace edema. Ted hose in place.  Neuro: alert & oriented x 3, cranial nerves grossly intact. moves all 4 extremities w/o difficulty. Affect  pleasant.    ASSESSMENT & PLAN:

## 2012-11-11 ENCOUNTER — Telehealth: Payer: Self-pay | Admitting: Internal Medicine

## 2012-11-11 LAB — ICD DEVICE OBSERVATION
CHARGE TIME: 10.1 s
DEV-0020ICD: NEGATIVE
DEVICE MODEL ICD: 60632078
RV LEAD IMPEDENCE ICD: 523 Ohm
TZON-0003FASTVT: 329.6 ms

## 2012-11-11 NOTE — Telephone Encounter (Signed)
New Problem: ° ° ° °Patient returned a call.  Please call back. °

## 2012-11-11 NOTE — Progress Notes (Signed)
defib check in clinic  

## 2012-11-11 NOTE — Telephone Encounter (Signed)
Louis Ford is going to call and schedule his 3 month office follow up

## 2012-11-13 ENCOUNTER — Other Ambulatory Visit (HOSPITAL_COMMUNITY): Payer: Self-pay | Admitting: Pharmacist

## 2012-11-13 MED ORDER — PHENYTOIN SODIUM EXTENDED 100 MG PO CAPS: 300.0000 mg | ORAL_CAPSULE | Freq: Every day | ORAL | Status: DC

## 2012-11-21 ENCOUNTER — Ambulatory Visit: Payer: PRIVATE HEALTH INSURANCE

## 2012-11-21 ENCOUNTER — Encounter: Payer: Self-pay | Admitting: Oncology

## 2012-11-21 ENCOUNTER — Inpatient Hospital Stay: Payer: PRIVATE HEALTH INSURANCE | Admitting: Oncology

## 2012-11-21 NOTE — Progress Notes (Signed)
66 year old man with advanced cardiac disease. He has an implantable defibrillator. I saw him  when he was hospitalized at [facility] back in September. 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 He failed to report for his first post hospital visit here and failed to report again today.

## 2012-11-22 ENCOUNTER — Telehealth: Payer: Self-pay | Admitting: Oncology

## 2012-11-22 NOTE — Telephone Encounter (Signed)
On call Med Onc  Called by patient due to running out of seizure medication, per EMR phenytoin ER 100mg  3 at hs. He has not contacted and was not aware to contact Dr Roseanne Reno at [facility]  to establish primary care, does not have f/u with neurology. I have called in #21 phenytoin as above for a weeks' supply to [facility] [phone] now. Patient instructed to call [facility] on 11-24-12 (Mon) and wrote down that #. Note he FTKA last apts with Dr Cyndie Chime also. Ila Mcgill, MD

## 2012-12-02 ENCOUNTER — Ambulatory Visit (HOSPITAL_COMMUNITY)
Admission: RE | Admit: 2012-12-02 | Discharge: 2012-12-02 | Disposition: A | Discharge status: Home / Self Care | Payer: PRIVATE HEALTH INSURANCE | Source: Ambulatory Visit | Attending: Internal Medicine | Admitting: Internal Medicine

## 2012-12-02 VITALS — BP 120/84 | HR 75 | Wt 213.4 lb

## 2012-12-02 DIAGNOSIS — I10 Essential (primary) hypertension: Secondary | ICD-10-CM

## 2012-12-02 DIAGNOSIS — D682 Hereditary deficiency of other clotting factors: Secondary | ICD-10-CM

## 2012-12-02 DIAGNOSIS — I5022 Chronic systolic (congestive) heart failure: Secondary | ICD-10-CM

## 2012-12-02 LAB — BASIC METABOLIC PANEL
CO2: 27 mEq/L (ref 19–32)
Calcium: 9.4 mg/dL (ref 8.4–10.5)
Chloride: 99 mEq/L (ref 96–112)
Sodium: 137 mEq/L (ref 135–145)

## 2012-12-02 NOTE — Assessment & Plan Note (Signed)
Encouraged to reschedule appointment with Dr Cyndie Chime.

## 2012-12-02 NOTE — Assessment & Plan Note (Signed)
NYHA II. Volume status stable. Continue current diuretic regimen. Will not adjust Ace Inhibitor because I do not have a recent BMET because he did not follow up for repeat labs. Check BMET today. He will receive another month of HF medications from [facility] outpatient pharmacy. Have provided him with the [facility] phone to establish with PCP.  Follow up in 3 weeks.

## 2012-12-02 NOTE — Progress Notes (Signed)
Patient ID: Louis Ford, male   DOB: 1946/05/24, 66 y.o.   MRN: [id number] EP: Dr Ladona Ridgel Hematologist: Dr Cyndie Chime  HPI: Mr. Louis Ford is a pleasant 66 year old man with an ischemic cardiomyopathy, chronic systolic HF s/p ICD Biotronik implant in 2006, s/p generator change June 2012, CAD, HTN, dyslipidemia, prior CVA, A fib, Seizure disorder, and Factor VII deficiency who recently relocated to [place] from [place]. Quit smoking a pipe 2008. Quit drinking alcohol 2008.    He was admitted to [facility] 09/21/2012 with acute on chronic systolic HF. On admission he was also found to have rapid AF. He was started on IV heparin and Coumadin.but later developed upper extremity hematomas and epitaxis. Anticoagulants stopped and hematology was consulted. He underwent TEE-guided and successful DCCV on 09/24/2012. He was diuresed with IV lasix . ECHO EF 15-20%. Also had a seizure and he was later started on Keppra. Discharge weight 198 pounds.   He returns for follow up. Denies SOB/PND/Orthopnea. Able to walk 2 blocks without dyspnea. Weight at home 210 pounds.   Compliant with medications but has difficulty getting them. Rides the bus to appointments. Tries follow low salt diet.    Past Medical History  Diagnosis Date  . CAD (coronary artery disease)   . Chronic systolic heart failure   . Ischemic cardiomyopathy     EF 15 to 20% by TTE and TEE in 09/2012.  Severe LV dysfunction  . HTN (hypertension)   . Hyperlipidemia   . Obesity     BMI 31 in 09/2012  . Atrial fibrillation   09/22/2012  . implantable cardiac defibrillator-Biotronik     Device Implanted 2006; s/p gen change 03/2011 : bleeding persistent with pocket erosion and infection; explant and reimplant  06/2011  . Stroke   . 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 History of MRSA infection 05/2011  . Seizure disorder latest 09/30/2012  . 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    Current Outpatient Prescriptions  Medication Sig Dispense Refill  . furosemide  (LASIX) 40 MG tablet Take 40 mg by mouth 2 (two) times daily.      [name] lisinopril (PRINIVIL,ZESTRIL) 2.5 MG tablet Take 1 tablet (2.5 mg total) by mouth daily.  30 tablet  6  . metoprolol succinate (TOPROL-XL) 25 MG 24 hr tablet Take 1 tablet (25 mg total) by mouth 2 (two) times daily.  60 tablet  6  . phenytoin (DILANTIN) 100 MG ER capsule Take 3 capsules (300 mg total) by mouth daily. At bedtime.  90 capsule  2  . potassium chloride (KLOR-CON) 20 MEQ packet Take 20 mEq by mouth daily.  30 tablet  6     No Known Allergies  History   Social History  . Marital Status: Single    Spouse Name: N/A    Number of Children: N/A  . Years of Education: N/A   Occupational History  . Not on file.   Social History Main Topics  . Smoking status: Former Smoker    Types: Pipe    Quit date: 09/21/2008  . Smokeless tobacco: Former Neurosurgeon    Quit date: 09/21/2008  . Alcohol Use: No  . Drug Use: No  . Sexually Active: No   Other Topics Concern  . Not on file   Social History Narrative  . No narrative on file     PHYSICAL EXAM: Filed Vitals:   12/02/12 1141  BP: 120/84  Pulse: 75  Weight: 213 lb 6.4 oz (96.798 kg)  SpO2: 96%  General: NAD.  No respiratory difficulty HEENT: normal Neck: supple. JVD 5-6  Carotids 2+ bilat; no bruits. No lymphadenopathy or thryomegaly appreciated. Cor: PMI nondisplaced. Irregular Regular rate & rhythm. No rubs, gallops or murmurs. Lungs: clear Abdomen: soft, nontender, nondistended. No hepatosplenomegaly. No bruits or masses. Good bowel sounds. Extremities: no cyanosis, clubbing, rash, no edema. Ted hose in place.  Neuro: alert & oriented x 3, cranial nerves grossly intact. moves all 4 extremities w/o difficulty. Affect pleasant.    ASSESSMENT & PLAN:

## 2012-12-02 NOTE — Patient Instructions (Addendum)
Follow up in 3 weeks  Do the following things EVERYDAY: 1) Weigh yourself in the morning before breakfast. Write it down and keep it in a log. 2) Take your medicines as prescribed 3) Eat low salt foods-Limit salt (sodium) to 2000 mg per day.  4) Stay as active as you can everyday 5) Limit all fluids for the day to less than 2 liters 

## 2012-12-02 NOTE — Assessment & Plan Note (Signed)
Stable; continue current regimen.

## 2013-01-30 ENCOUNTER — Encounter: Payer: Self-pay | Admitting: Internal Medicine

## 2013-02-02 ENCOUNTER — Other Ambulatory Visit: Payer: Self-pay | Admitting: Oncology

## 2013-02-03 ENCOUNTER — Telehealth: Payer: Self-pay | Admitting: *Deleted

## 2013-02-03 NOTE — Telephone Encounter (Signed)
Dilantin refill request received 02-02-2013.  Dilantin last filled on 11-22-2012 for a 7-day supply.  Unable to reach patient. Called Pharmacy and was given [phone] to call patient.  Pharmacy confirms last refill date.   Called [phone], phone rang but received Cricket cell message that patient out of service area.  Unable to leave message for patient at either phone number.   Will leave request for provider review.

## 2013-02-04 ENCOUNTER — Other Ambulatory Visit: Payer: Self-pay | Admitting: Oncology

## 2013-02-24 ENCOUNTER — Other Ambulatory Visit: Payer: Self-pay | Admitting: Oncology

## 2013-10-13 ENCOUNTER — Encounter (HOSPITAL_COMMUNITY): Payer: Self-pay | Admitting: Emergency Medicine

## 2013-10-13 ENCOUNTER — Emergency Department (HOSPITAL_COMMUNITY)
Admission: EM | Admit: 2013-10-13 | Discharge: 2013-10-14 | Disposition: A | Discharge status: Home / Self Care | Payer: Medicare Other | Attending: Emergency Medicine | Admitting: Emergency Medicine

## 2013-10-13 DIAGNOSIS — E669 Obesity, unspecified: Secondary | ICD-10-CM | POA: Insufficient documentation

## 2013-10-13 DIAGNOSIS — R569 Unspecified convulsions: Secondary | ICD-10-CM

## 2013-10-13 DIAGNOSIS — R609 Edema, unspecified: Secondary | ICD-10-CM | POA: Insufficient documentation

## 2013-10-13 DIAGNOSIS — I5022 Chronic systolic (congestive) heart failure: Secondary | ICD-10-CM | POA: Insufficient documentation

## 2013-10-13 DIAGNOSIS — Y92009 Unspecified place in unspecified non-institutional (private) residence as the place of occurrence of the external cause: Secondary | ICD-10-CM | POA: Insufficient documentation

## 2013-10-13 DIAGNOSIS — I10 Essential (primary) hypertension: Secondary | ICD-10-CM | POA: Insufficient documentation

## 2013-10-13 DIAGNOSIS — E785 Hyperlipidemia, unspecified: Secondary | ICD-10-CM | POA: Insufficient documentation

## 2013-10-13 DIAGNOSIS — Y939 Activity, unspecified: Secondary | ICD-10-CM | POA: Insufficient documentation

## 2013-10-13 DIAGNOSIS — I2510 Atherosclerotic heart disease of native coronary artery without angina pectoris: Secondary | ICD-10-CM | POA: Insufficient documentation

## 2013-10-13 DIAGNOSIS — R51 Headache: Secondary | ICD-10-CM | POA: Insufficient documentation

## 2013-10-13 DIAGNOSIS — Z87891 Personal history of nicotine dependence: Secondary | ICD-10-CM | POA: Insufficient documentation

## 2013-10-13 DIAGNOSIS — R413 Other amnesia: Secondary | ICD-10-CM | POA: Insufficient documentation

## 2013-10-13 DIAGNOSIS — F05 Delirium due to known physiological condition: Secondary | ICD-10-CM | POA: Insufficient documentation

## 2013-10-13 DIAGNOSIS — Z951 Presence of aortocoronary bypass graft: Secondary | ICD-10-CM | POA: Insufficient documentation

## 2013-10-13 DIAGNOSIS — Z8614 Personal history of Methicillin resistant Staphylococcus aureus infection: Secondary | ICD-10-CM | POA: Insufficient documentation

## 2013-10-13 DIAGNOSIS — Z4502 Encounter for adjustment and management of automatic implantable cardiac defibrillator: Secondary | ICD-10-CM

## 2013-10-13 DIAGNOSIS — I4891 Unspecified atrial fibrillation: Secondary | ICD-10-CM | POA: Insufficient documentation

## 2013-10-13 DIAGNOSIS — Z95810 Presence of automatic (implantable) cardiac defibrillator: Secondary | ICD-10-CM | POA: Insufficient documentation

## 2013-10-13 DIAGNOSIS — W19XXXA Unspecified fall, initial encounter: Secondary | ICD-10-CM | POA: Insufficient documentation

## 2013-10-13 DIAGNOSIS — Z8673 Personal history of transient ischemic attack (TIA), and cerebral infarction without residual deficits: Secondary | ICD-10-CM | POA: Insufficient documentation

## 2013-10-13 DIAGNOSIS — I2589 Other forms of chronic ischemic heart disease: Secondary | ICD-10-CM | POA: Insufficient documentation

## 2013-10-13 DIAGNOSIS — G40909 Epilepsy, unspecified, not intractable, without status epilepticus: Secondary | ICD-10-CM | POA: Insufficient documentation

## 2013-10-13 DIAGNOSIS — Z79899 Other long term (current) drug therapy: Secondary | ICD-10-CM | POA: Insufficient documentation

## 2013-10-13 DIAGNOSIS — D682 Hereditary deficiency of other clotting factors: Secondary | ICD-10-CM | POA: Insufficient documentation

## 2013-10-13 LAB — BASIC METABOLIC PANEL
BUN: 21 mg/dL (ref 6–23)
CO2: 27 mEq/L (ref 19–32)
Calcium: 8.9 mg/dL (ref 8.4–10.5)
Chloride: 101 mEq/L (ref 96–112)
Creatinine, Ser: 1.31 mg/dL (ref 0.50–1.35)
GFR calc Af Amer: 65 mL/min — ABNORMAL LOW (ref >90)
GFR calc non Af Amer: 56 mL/min — ABNORMAL LOW (ref >90)
Glucose, Bld: 85 mg/dL (ref 70–99)
Potassium: 4.1 mEq/L (ref 3.5–5.1)
Sodium: 137 mEq/L (ref 135–145)

## 2013-10-13 LAB — CBC WITH DIFFERENTIAL/PLATELET
Basophils Absolute: 0 10*3/uL (ref 0.0–0.1)
Basophils Relative: 1 % (ref 0–1)
Eosinophils Absolute: 0.3 10*3/uL (ref 0.0–0.7)
Eosinophils Relative: 9 % — ABNORMAL HIGH (ref 0–5)
HCT: 45.1 % (ref 39.0–52.0)
Hemoglobin: 15.1 g/dL (ref 13.0–17.0)
Lymphocytes Relative: 45 % (ref 12–46)
Lymphs Abs: 1.6 10*3/uL (ref 0.7–4.0)
MCH: 30.3 pg (ref 26.0–34.0)
MCHC: 33.5 g/dL (ref 30.0–36.0)
MCV: 90.4 fL (ref 78.0–100.0)
Monocytes Absolute: 0.5 10*3/uL (ref 0.1–1.0)
Monocytes Relative: 14 % — ABNORMAL HIGH (ref 3–12)
Neutro Abs: 1.1 10*3/uL — ABNORMAL LOW (ref 1.7–7.7)
Neutrophils Relative %: 31 % — ABNORMAL LOW (ref 43–77)
Platelets: 156 10*3/uL (ref 150–400)
RBC: 4.99 MIL/uL (ref 4.22–5.81)
RDW: 13.9 % (ref 11.5–15.5)
WBC: 3.6 10*3/uL — ABNORMAL LOW (ref 4.0–10.5)

## 2013-10-13 LAB — URINALYSIS, ROUTINE W REFLEX MICROSCOPIC
Glucose, UA: NEGATIVE mg/dL
Ketones, ur: 15 mg/dL — AB
Nitrite: NEGATIVE
Protein, ur: 100 mg/dL — AB
Specific Gravity, Urine: 1.015 (ref 1.005–1.030)
Urobilinogen, UA: 1 mg/dL (ref 0.0–1.0)
pH: 5 (ref 5.0–8.0)

## 2013-10-13 LAB — URINE MICROSCOPIC-ADD ON

## 2013-10-13 LAB — TROPONIN I: Troponin I: <0.3 ng/mL (ref <0.30)

## 2013-10-13 MED ORDER — PANTOPRAZOLE SODIUM 40 MG IV SOLR: 40.0000 mg | Freq: Once | INTRAVENOUS | Status: DC

## 2013-10-13 MED ORDER — OXYCODONE-ACETAMINOPHEN 5-325 MG PO TABS
2.0000 | ORAL_TABLET | Freq: Once | ORAL | Status: AC
  Administered 2013-10-13: 2 via ORAL
  Filled 2013-10-13: qty 2

## 2013-10-13 NOTE — ED Notes (Signed)
Myself and Marylu Lund, EMT undressed pt, placed in gown, on monitor, continuous pulse oximetry and blood pressure cuff; vitals and EKG being performed; family at bedside; seizure pads on bedrails

## 2013-10-13 NOTE — ED Notes (Signed)
Pt to ED via EMS post seizure. Per family, pt had grand mal seizure, lasted about 2 minutes. Per EMS, pt postictal at arrival, alert and orient afterward. Pt with cardiac Hx and reports his defibrillator fired yesterday and he did not notify any health provider. Per EMS, CBG-84, BP-144/100, Pulse-120 initial.
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
## 2013-10-13 NOTE — ED Notes (Signed)
Warm blanket given

## 2013-10-13 NOTE — ED Notes (Signed)
Received report from off going RN and introduced self to the pt. 

## 2013-10-14 LAB — URINE CULTURE
Colony Count: NO GROWTH
Culture: NO GROWTH

## 2013-10-14 MED ORDER — METOPROLOL TARTRATE 25 MG PO TABS: 25.0000 mg | ORAL_TABLET | Freq: Two times a day (BID) | ORAL | Status: DC

## 2013-10-14 NOTE — Discharge Instructions (Signed)
Seizure, Adult A seizure is abnormal electrical activity in the brain. Seizures can cause a change in attention or behavior (altered mental status). Seizures often involve uncontrollable shaking (convulsions). Seizures usually last from 30 seconds to 2 minutes. Epilepsy is a brain disorder in which a patient has repeated seizures over time. CAUSES  There are many different problems that can cause seizures. In some cases, no cause is found. Common causes of seizures include:  Head injuries.  Brain tumors.  Infections.  Imbalance of chemicals in the blood.  Kidney failure or liver failure.  Heart disease.  Drug abuse.  Stroke.  Withdrawal from certain drugs or alcohol.  Birth defects.  Malfunction of a neurosurgical device placed in the brain. SYMPTOMS  Symptoms vary depending on the part of the brain that is involved. Right before a seizure, you may have a warning (aura) that a seizure is about to occur. An aura may include the following symptoms:   Fear or anxiety.  Nausea.  Feeling like the room is spinning (vertigo).  Vision changes, such as seeing flashing lights or spots. Common symptoms during a seizure include:  Convulsions.  Drooling.  Rapid eye movements.  Grunting.  Loss of bladder and bowel control.  Bitter taste in the mouth. After a seizure, you may feel confused and sleepy. You may also have an injury resulting from convulsions during the seizure. DIAGNOSIS  Your caregiver will perform a physical exam and run some tests to determine the type and cause of your seizure. These tests may include:  Blood tests.  A lumbar puncture test. In this test, a small amount of fluid is removed from the spine and examined.  Electrocardiography (ECG). This test records the electrical activity in your heart.  Imaging tests, such as computed tomography (CT) scans or magnetic resonance imaging (MRI).  Electroencephalography (EEG). This test records the electrical  activity in your brain. TREATMENT  Seizures usually stop on their own. Treatment will depend on the cause of your seizure. In some cases, medicine may be given to prevent future seizures. HOME CARE INSTRUCTIONS   If you are given medicines, take them exactly as prescribed by your caregiver.  Keep all follow-up appointments as directed by your caregiver.  Do not swim or drive until your caregiver says it is okay.  Teach friends and family what to do if you have a seizure. They should:  Lay you on the ground to prevent a fall.  Put a cushion under your head.  Loosen any tight clothing around your neck.  Turn you on your side. If vomiting occurs, this helps keep your airway clear.  Stay with you until you recover. SEEK IMMEDIATE MEDICAL CARE IF:  The seizure lasts longer than 2 to 5 minutes.  The seizure is severe or the person does not wake up after the seizure.  The person has altered mental status. Drive the person to the emergency department or call your local emergency services (911 in [place] MAKE SURE YOU:  Understand these instructions.  Will watch your condition.  Will get help right away if you are not doing well or get worse. Document Released: 12/07/2000 Document Revised: 03/03/2012 Document Reviewed: 11/28/2011 [employer] Patient Information 2014 [employer].  

## 2013-10-17 LAB — PHENYTOIN LEVEL, FREE AND TOTAL
Phenytoin, Free: <0.5 mg/L (ref 1.0–2.0)
Phenytoin, Total: <2.5 mg/L (ref 10.0–20.0)

## 2013-11-05 ENCOUNTER — Encounter: Payer: Medicare Other | Admitting: Internal Medicine

## 2013-11-24 ENCOUNTER — Encounter: Payer: Self-pay | Admitting: *Deleted

## 2013-11-27 ENCOUNTER — Encounter: Payer: Self-pay | Admitting: Internal Medicine

## 2013-11-27 ENCOUNTER — Ambulatory Visit (INDEPENDENT_AMBULATORY_CARE_PROVIDER_SITE_OTHER): Payer: Medicare Other | Admitting: Internal Medicine

## 2013-11-27 ENCOUNTER — Encounter (INDEPENDENT_AMBULATORY_CARE_PROVIDER_SITE_OTHER): Payer: Self-pay

## 2013-11-27 VITALS — BP 140/92 | HR 85 | Ht 70.0 in | Wt 212.0 lb

## 2013-11-27 DIAGNOSIS — I4891 Unspecified atrial fibrillation: Secondary | ICD-10-CM

## 2013-11-27 DIAGNOSIS — I5022 Chronic systolic (congestive) heart failure: Secondary | ICD-10-CM

## 2013-11-27 DIAGNOSIS — Z95810 Presence of automatic (implantable) cardiac defibrillator: Secondary | ICD-10-CM

## 2013-11-27 LAB — MDC_IDC_ENUM_SESS_TYPE_INCLINIC
Brady Statistic RV Percent Paced: 0 %
HighPow Impedance: 65 Ohm
Implantable Pulse Generator Model: 540
Implantable Pulse Generator Serial Number: 60632078
Lead Channel Impedance Value: 495 Ohm
Lead Channel Pacing Threshold Amplitude: 0.5 V
Lead Channel Pacing Threshold Pulse Width: 0.4 ms
Lead Channel Sensing Intrinsic Amplitude: 13.2 mV
Lead Channel Setting Pacing Amplitude: 2 V
Lead Channel Setting Pacing Pulse Width: 0.4 ms
Zone Setting Detection Interval: 259.74 ms

## 2013-11-27 NOTE — Progress Notes (Signed)
HPI Louis Ford is referred today for ongoing evaluation and management of his ICD. He is a very pleasant 67 year old man who was recently moved from [place] to [place] to be closer to family. He has a history of an ischemic cardiomyopathy and chronic systolic heart failure, class II. He recently received an ICD shock, and had no symptoms prior to the event. Interrogation of his device demonstrated atrial fibrillation with a rapid ventricular response. He is not on systemic anticoagulation. He denies chest pain and his shortness of breath is class IIA. No syncope and no peripheral edema. He notes a large bruise over his right arm but denies any trauma No Known Allergies   Current Outpatient Prescriptions  Medication Sig Dispense Refill  . furosemide (LASIX) 40 MG tablet Take 40 mg by mouth daily.       [name] lisinopril (PRINIVIL,ZESTRIL) 2.5 MG tablet Take 1 tablet (2.5 mg total) by mouth daily.  30 tablet  6  . metoprolol tartrate (LOPRESSOR) 25 MG tablet Take 1 tablet (25 mg total) by mouth 2 (two) times daily.  60 tablet  0   No current facility-administered medications for this visit.     Past Medical History  Diagnosis Date  . CAD (coronary artery disease)   . Chronic systolic heart failure   . Ischemic cardiomyopathy     EF 15 to 20% by TTE and TEE in 09/2012.  Severe LV dysfunction  . HTN (hypertension)   . Hyperlipidemia   . Obesity     BMI 31 in 09/2012  . Atrial fibrillation   09/22/2012  . implantable cardiac defibrillator-Biotronik     Device Implanted 2006; s/p gen change 03/2011 : bleeding persistent with pocket erosion and infection; explant and reimplant  06/2011  . Stroke   . 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 History of MRSA infection 05/2011  . Seizure disorder latest 09/30/2012  . 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   Past Surgical History  Procedure Laterality Date  . Coronary  artery bypass graft    . Tee without cardioversion  09/24/2012    Procedure: TRANSESOPHAGEAL ECHOCARDIOGRAM (TEE);  Surgeon: Vesta Mixer, MD;  Location: [facility] ENDOSCOPY;  Service: Cardiovascular;  Laterality: N/A;  dave/anesth, dl, cindy/echo   . Cardioversion  09/24/2012    Procedure: CARDIOVERSION;  Surgeon: Vesta Mixer, MD;  Location: [facility] ENDOSCOPY;  Service: Cardiovascular;  Laterality: N/A;  . Icd       No family history on file.   History   Social History  . Marital Status: Single    Spouse Name: N/A    Number of Children: N/A  . Years of Education: N/A   Occupational History  . Not on file.   Social History Main Topics  . Smoking status: Former Smoker    Types: Pipe    Quit date: 09/21/2008  . Smokeless tobacco: Former Neurosurgeon    Quit date: 09/21/2008  . Alcohol Use: No  . Drug Use: No  . Sexual Activity: No   Other Topics Concern  . Not on file   Social History Narrative  . No narrative on file     BP 140/92  Pulse 85  Ht 5\' 10"  (1.778 m)  Wt 212 lb (96.163 kg)  BMI 30.42 kg/m2  Physical Exam:  Well appearing 67 year old man, who looks younger than her stated age, but in NAD HEENT:  Unremarkable Neck:  7 cm JVD, no thyromegally Back:  No CVA tenderness Lungs:  Clear with no wheezes, rales, or rhonchi. HEART:  Regular rate rhythm, no murmurs, no rubs, no clicks Abd:  soft, positive bowel sounds, no organomegally, no rebound, no guarding Ext:  2 plus pulses, no edema, no cyanosis, no clubbing Skin:  No rashes no nodules Neuro:  CN II through XII intact, motor grossly intact  EKG - normal sinus rhythm with frequent PACs and left ventricular hypertrophy  DEVICE  Normal device function.  See PaceArt for details.   Assess/Plan:

## 2013-11-27 NOTE — Assessment & Plan Note (Signed)
His heart failure symptoms are class IIA. He'll continue his current medical therapy with a recommendation for a low-sodium diet.

## 2013-11-27 NOTE — Patient Instructions (Addendum)
Your physician wants you to follow-up in: 6 months with Dr Court Joy will receive a reminder letter in the mail two months in advance. If you don't receive a letter, please call our office to schedule the follow-up appointment.  Remote monitoring is used to monitor your Pacemaker or ICD from home. This monitoring reduces the number of office visits required to check your device to one time per year. It allows [place] to keep an eye on the functioning of your device to ensure it is working properly. You are scheduled for a device check from home on 03/06/14. You may send your transmission at any time that day. If you have a wireless device, the transmission will be sent automatically. After your physician reviews your transmission, you will receive a postcard with your next transmission date.    Your physician recommends that you schedule a follow-up appointment in: Jan with Coumadin Clinic as new patient  Your physician has recommended you make the following change in your medication:  1) Start Coumadin 5mg  daily ---start in 12/22/13--patient has prescription in hand

## 2013-11-27 NOTE — Assessment & Plan Note (Signed)
His Biotronik ICD is working normally. We'll plan to recheck in several months.

## 2013-11-27 NOTE — Assessment & Plan Note (Signed)
The patient has paroxysmal atrial fibrillation and multiple risk factors for stroke. He is not anticoagulated. I've recommended that he start taking Coumadin in several weeks. We will schedule him for followup in our Coumadin clinic.

## 2013-12-23 ENCOUNTER — Encounter (HOSPITAL_COMMUNITY): Payer: Self-pay | Admitting: Emergency Medicine

## 2013-12-23 ENCOUNTER — Emergency Department (HOSPITAL_COMMUNITY)
Admission: EM | Admit: 2013-12-23 | Discharge: 2013-12-23 | Disposition: A | Discharge status: Home / Self Care | Payer: Medicare Other | Attending: Emergency Medicine | Admitting: Emergency Medicine

## 2013-12-23 ENCOUNTER — Telehealth: Payer: Self-pay | Admitting: Pharmacist

## 2013-12-23 ENCOUNTER — Telehealth: Payer: Self-pay | Admitting: Internal Medicine

## 2013-12-23 DIAGNOSIS — I5022 Chronic systolic (congestive) heart failure: Secondary | ICD-10-CM | POA: Insufficient documentation

## 2013-12-23 DIAGNOSIS — Z95810 Presence of automatic (implantable) cardiac defibrillator: Secondary | ICD-10-CM | POA: Insufficient documentation

## 2013-12-23 DIAGNOSIS — Z79899 Other long term (current) drug therapy: Secondary | ICD-10-CM | POA: Insufficient documentation

## 2013-12-23 DIAGNOSIS — Z8614 Personal history of Methicillin resistant Staphylococcus aureus infection: Secondary | ICD-10-CM | POA: Insufficient documentation

## 2013-12-23 DIAGNOSIS — Z862 Personal history of diseases of the blood and blood-forming organs and certain disorders involving the immune mechanism: Secondary | ICD-10-CM | POA: Insufficient documentation

## 2013-12-23 DIAGNOSIS — Z8639 Personal history of other endocrine, nutritional and metabolic disease: Secondary | ICD-10-CM | POA: Insufficient documentation

## 2013-12-23 DIAGNOSIS — Z87891 Personal history of nicotine dependence: Secondary | ICD-10-CM | POA: Insufficient documentation

## 2013-12-23 DIAGNOSIS — E669 Obesity, unspecified: Secondary | ICD-10-CM | POA: Insufficient documentation

## 2013-12-23 DIAGNOSIS — G40909 Epilepsy, unspecified, not intractable, without status epilepticus: Secondary | ICD-10-CM | POA: Insufficient documentation

## 2013-12-23 DIAGNOSIS — Z8673 Personal history of transient ischemic attack (TIA), and cerebral infarction without residual deficits: Secondary | ICD-10-CM | POA: Insufficient documentation

## 2013-12-23 DIAGNOSIS — I10 Essential (primary) hypertension: Secondary | ICD-10-CM | POA: Insufficient documentation

## 2013-12-23 DIAGNOSIS — Z951 Presence of aortocoronary bypass graft: Secondary | ICD-10-CM | POA: Insufficient documentation

## 2013-12-23 DIAGNOSIS — R569 Unspecified convulsions: Secondary | ICD-10-CM

## 2013-12-23 DIAGNOSIS — I2510 Atherosclerotic heart disease of native coronary artery without angina pectoris: Secondary | ICD-10-CM | POA: Insufficient documentation

## 2013-12-23 HISTORY — DX: Unspecified convulsions: R56.9

## 2013-12-23 LAB — BASIC METABOLIC PANEL
BUN: 19 mg/dL (ref 6–23)
CO2: 24 mEq/L (ref 19–32)
Calcium: 8.7 mg/dL (ref 8.4–10.5)
Chloride: 104 mEq/L (ref 96–112)
Creatinine, Ser: 1.14 mg/dL (ref 0.50–1.35)
GFR calc Af Amer: 76 mL/min — ABNORMAL LOW (ref >90)
GFR calc non Af Amer: 66 mL/min — ABNORMAL LOW (ref >90)
Glucose, Bld: 105 mg/dL — ABNORMAL HIGH (ref 70–99)

## 2013-12-23 LAB — GLUCOSE, CAPILLARY: Glucose-Capillary: 109 mg/dL — ABNORMAL HIGH (ref 70–99)

## 2013-12-23 MED ORDER — WARFARIN SODIUM 5 MG PO TABS: 5.0000 mg | ORAL_TABLET | Freq: Every day | ORAL | Status: DC

## 2013-12-23 MED ORDER — ASPIRIN EC 81 MG PO TBEC: 81.0000 mg | DELAYED_RELEASE_TABLET | Freq: Every day | ORAL | Status: DC

## 2013-12-23 MED ORDER — LORAZEPAM 2 MG/ML IJ SOLN
1.0000 mg | Freq: Once | INTRAMUSCULAR | Status: AC
  Administered 2013-12-23: 1 mg via INTRAVENOUS
  Filled 2013-12-23: qty 1

## 2013-12-23 NOTE — Telephone Encounter (Signed)
Stop ASA once Warfarin is therapeutic. GT

## 2013-12-23 NOTE — Telephone Encounter (Signed)
Patient lost prescription for warfarin that Dr. Ladona Ridgel gave a month ago.  Patient suppose to start warfarin tonight.  They use [facility] on [address].  Patient also currently on aspirin 325 mg qd.  Will have patient reduce to aspirin 81 mg qd today, start warfarin 5 mg qd, and keep appointment in 5 days.  May be able to stop aspirin once INR is therapeutic.  Prescription sent to pharmacy.   Patient's family shows verbal understanding of plan.  Dr. Ladona Ridgel, let us know if you want aspirin continued or stopped once INR is therapeutic.  Thanks.

## 2013-12-23 NOTE — ED Notes (Signed)
CBg  74, BP 160/110 Per ems

## 2013-12-23 NOTE — ED Provider Notes (Signed)
CSN: [id number]     Arrival date & time 12/23/13  0422 History   First MD Initiated Contact with Patient 12/23/13 [phone]     Chief Complaint  Patient presents with  . Seizures   (Consider location/radiation/quality/duration/timing/severity/associated sxs/prior Treatment) Patient is a 67 y.o. male presenting with seizures. The history is provided by the patient and a relative.  Seizures He has a history of seizure disorder with last seizure having occurred about 2 months ago. His son heard a side and went to check on him and he was having a seizure. There is no bit lip or tongue, but there was urinary incontinence. When the seizure stopped, he had a postictal state which lasted about 30 minutes. He is back to baseline now. EMS reported that he was combative and gave him medazepam 5 mg we'll with significant improvement. Of note, he has a 10 given 2 prescriptions which are getting filled later today and one of those is for an anticonvulsant but neither the patient is unknown what the anticonvulsant is. He has been on levetiracetam and phenytoin in the past.  Past Medical History  Diagnosis Date  . CAD (coronary artery disease)   . Chronic systolic heart failure   . Ischemic cardiomyopathy     EF 15 to 20% by TTE and TEE in 09/2012.  Severe LV dysfunction  . HTN (hypertension)   . Hyperlipidemia   . Obesity     BMI 31 in 09/2012  . Atrial fibrillation   09/22/2012  . implantable cardiac defibrillator-Biotronik     Device Implanted 2006; s/p gen change 03/2011 : bleeding persistent with pocket erosion and infection; explant and reimplant  06/2011  . Stroke   . 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 History of MRSA infection 05/2011  . Seizure disorder latest 09/30/2012  . 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 Seizures    Past Surgical History  Procedure Laterality Date  . Coronary artery bypass graft    . Tee without cardioversion  09/24/2012    Procedure: TRANSESOPHAGEAL ECHOCARDIOGRAM (TEE);   Surgeon: Vesta Mixer, MD;  Location: [facility] ENDOSCOPY;  Service: Cardiovascular;  Laterality: N/A;  dave/anesth, dl, cindy/echo   . Cardioversion  09/24/2012    Procedure: CARDIOVERSION;  Surgeon: Vesta Mixer, MD;  Location: [facility] ENDOSCOPY;  Service: Cardiovascular;  Laterality: N/A;  . Icd     No family history on file. History  Substance Use Topics  . Smoking status: Former Smoker    Types: Pipe    Quit date: 09/21/2008  . Smokeless tobacco: Former Neurosurgeon    Quit date: 09/21/2008  . Alcohol Use: No    Review of Systems  Neurological: Positive for seizures.  All other systems reviewed and are negative.    Allergies  Review of patient's allergies indicates no known allergies.  Home Medications   Current Outpatient Rx  Name  Route  Sig  Dispense  Refill  . furosemide (LASIX) 40 MG tablet   Oral   Take 40 mg by mouth daily.          [name] lisinopril (PRINIVIL,ZESTRIL) 2.5 MG tablet   Oral   Take 1 tablet (2.5 mg total) by mouth daily.   30 tablet   6   . metoprolol tartrate (LOPRESSOR) 25 MG tablet   Oral   Take 1 tablet (25 mg total) by mouth 2 (two) times daily.   60 tablet   0    BP 130/91  Pulse 93  Temp(Src) 97.6 F (36.4  C) (Oral)  Resp 20  SpO2 100% Physical Exam  Nursing note and vitals reviewed.  67 year old male, resting comfortably and in no acute distress. Vital signs are significant for borderline hypertension with blood pressure 130/91. Oxygen saturation is 100%, which is normal. Head is normocephalic and atraumatic. PERRLA, EOMI. Oropharynx is clear. Neck is nontender and supple without adenopathy or JVD. Back is nontender and there is no CVA tenderness. Lungs are clear without rales, wheezes, or rhonchi. Chest is nontender. Heart has regular rate and rhythm without murmur. Abdomen is soft, flat, nontender without masses or hepatosplenomegaly and peristalsis is normoactive. Extremities have trace edema, full range of motion is present. Skin  is warm and dry without rash. Neurologic: Mental status is normal, cranial nerves are intact, there are no motor or sensory deficits.  ED Course  Procedures (including critical care time) Labs Review Results for orders placed during the hospital encounter of 12/23/13  BASIC METABOLIC PANEL      Result Value Range   Sodium 140  137 - 147 mEq/L   Potassium 4.1  3.7 - 5.3 mEq/L   Chloride 104  96 - 112 mEq/L   CO2 24  19 - 32 mEq/L   Glucose, Bld 105 (*) 70 - 99 mg/dL   BUN 19  6 - 23 mg/dL   Creatinine, Ser 1.61  0.50 - 1.35 mg/dL   Calcium 8.7  8.4 - 09.6 mg/dL   GFR calc non Af Amer 66 (*) >90 mL/min   GFR calc Af Amer 76 (*) >90 mL/min     Date: 12/23/2013  Rate: 91  Rhythm: normal sinus rhythm and premature atrial contractions (PAC)  QRS Axis: left  Intervals: QT prolonged  ST/T Wave abnormalities: nonspecific ST/T changes  Conduction Disutrbances:none  Narrative Interpretation: Occasional premature atrial contraction, left atrial hypertrophy, left axis deviation, nonspecific ST and T changes, borderline prolonged QT interval. When compared with ECG of 10/13/2013, no significant changes are seen.  Old EKG Reviewed: unchanged   MDM   1. Seizure    Seizure in patient with known seizure disorder. He is on a diuretic, so electrolytes will be checked. He is going to be picking up his prescription for his anticonvulsant later today. Since it is not clear whether it is phenytoin or levetiracetam, I will not give a loading dose in the ED but will have him start on the medication when the prescription is filled. Review of old records shows that he was supposed to be on phenytoin when he was seen in the ED in October, but it was not listed on his medications when he was seen for a cardiology visit on December 5.    Dione Booze, MD 12/23/13 [phone]

## 2013-12-23 NOTE — ED Notes (Signed)
CBG 109 

## 2013-12-23 NOTE — ED Notes (Signed)
Family heard patient go from bed to floor and found him actively seizing.  Pt was postictal and extremely combative for EMS.  Pt received 5mg  IV Versed.  Pt is alert and calmer now.  Pt has history of cva, seizure and defibrillator.  Pt has ran out of seizure medication.  No deficits from previous stroke

## 2013-12-23 NOTE — Telephone Encounter (Signed)
New message   Pt states pharmacy lost prescription for warfarin. Please call pt.

## 2013-12-23 NOTE — Telephone Encounter (Signed)
Already addressed in another telephone encounter.

## 2013-12-25 ENCOUNTER — Telehealth: Payer: Self-pay | Admitting: *Deleted

## 2013-12-25 NOTE — Telephone Encounter (Signed)
Talked with Mariann Laster regarding pt she is asking since was unable to start coumadin until today should she keep his app scheduled on Monday and this nurse instructed her to keep appt as scheduled and to start coumadin 5mg  tonight and take daily as ordered and also talked with her to be sure he has reduced ASA to 81mg  daily and she states that is the dose that he has and will be taking. Mariann Laster verbalized understanding of instructions given

## 2013-12-25 NOTE — Telephone Encounter (Signed)
Noted  

## 2014-01-01 ENCOUNTER — Ambulatory Visit (INDEPENDENT_AMBULATORY_CARE_PROVIDER_SITE_OTHER): Payer: Medicare Other | Admitting: Pharmacist

## 2014-01-01 DIAGNOSIS — I4891 Unspecified atrial fibrillation: Secondary | ICD-10-CM

## 2014-01-01 DIAGNOSIS — Z7901 Long term (current) use of anticoagulants: Secondary | ICD-10-CM | POA: Insufficient documentation

## 2014-01-01 LAB — POCT INR: INR: 1.7

## 2014-01-01 NOTE — Progress Notes (Signed)

## 2014-01-01 NOTE — Patient Instructions (Signed)
A full discussion of the nature of anticoagulants has been carried out.  A benefit risk analysis has been presented to the patient, so that they understand the justification for choosing anticoagulation at this time. The need for frequent and regular monitoring, precise dosage adjustment and compliance is stressed.  Side effects of potential bleeding are discussed.  The patient should avoid any OTC items containing aspirin or ibuprofen, and should avoid great swings in general diet.  Avoid alcohol consumption.  Call if any signs of abnormal bleeding.   Call if put on antibiotics or prednisone.  Try to be consistent with the amount of green leafy vegetables you eat from week to week.

## 2014-01-19 ENCOUNTER — Ambulatory Visit (INDEPENDENT_AMBULATORY_CARE_PROVIDER_SITE_OTHER): Payer: Medicare Other | Admitting: *Deleted

## 2014-01-19 DIAGNOSIS — Z7901 Long term (current) use of anticoagulants: Secondary | ICD-10-CM

## 2014-01-19 DIAGNOSIS — Z5181 Encounter for therapeutic drug level monitoring: Secondary | ICD-10-CM

## 2014-01-19 DIAGNOSIS — I4891 Unspecified atrial fibrillation: Secondary | ICD-10-CM

## 2014-01-19 LAB — POCT INR: INR: 1.6

## 2014-01-28 ENCOUNTER — Ambulatory Visit (INDEPENDENT_AMBULATORY_CARE_PROVIDER_SITE_OTHER): Payer: Medicare Other | Admitting: Pharmacist

## 2014-01-28 DIAGNOSIS — Z5181 Encounter for therapeutic drug level monitoring: Secondary | ICD-10-CM

## 2014-01-28 DIAGNOSIS — Z7901 Long term (current) use of anticoagulants: Secondary | ICD-10-CM

## 2014-01-28 DIAGNOSIS — I4891 Unspecified atrial fibrillation: Secondary | ICD-10-CM

## 2014-01-28 LAB — POCT INR: INR: 2.2

## 2014-03-01 ENCOUNTER — Telehealth: Payer: Self-pay | Admitting: Internal Medicine

## 2014-03-01 ENCOUNTER — Ambulatory Visit (INDEPENDENT_AMBULATORY_CARE_PROVIDER_SITE_OTHER): Payer: Medicare Other | Admitting: Pharmacist

## 2014-03-01 DIAGNOSIS — I4891 Unspecified atrial fibrillation: Secondary | ICD-10-CM

## 2014-03-01 DIAGNOSIS — Z5181 Encounter for therapeutic drug level monitoring: Secondary | ICD-10-CM

## 2014-03-01 DIAGNOSIS — Z7901 Long term (current) use of anticoagulants: Secondary | ICD-10-CM

## 2014-03-01 LAB — POCT INR: INR: 1.2

## 2014-03-01 NOTE — Telephone Encounter (Signed)
LMOVM stating we are receiving his transmissions.

## 2014-03-01 NOTE — Telephone Encounter (Signed)
New Problem:  Pt is requesting a call back form one of our device techs. Needs help sending in a transmission

## 2014-03-04 ENCOUNTER — Ambulatory Visit (INDEPENDENT_AMBULATORY_CARE_PROVIDER_SITE_OTHER): Payer: Medicare Other | Admitting: *Deleted

## 2014-03-04 DIAGNOSIS — I4891 Unspecified atrial fibrillation: Secondary | ICD-10-CM

## 2014-03-16 LAB — MDC_IDC_ENUM_SESS_TYPE_REMOTE
Battery Voltage: 3.11 V
Brady Statistic RV Percent Paced: 0 %
HighPow Impedance: 59 Ohm
Implantable Pulse Generator Model: 540
Implantable Pulse Generator Serial Number: 60632078
Lead Channel Impedance Value: 482 Ohm
Lead Channel Pacing Threshold Amplitude: 0.5 V
Lead Channel Pacing Threshold Pulse Width: 0.4 ms
Lead Channel Sensing Intrinsic Amplitude: 12.5 mV
Lead Channel Setting Pacing Amplitude: 2 V
Lead Channel Setting Pacing Pulse Width: 0.4 ms
Zone Setting Detection Interval: 259.74 ms
Zone Setting Detection Interval: 329.67 ms

## 2014-03-19 ENCOUNTER — Telehealth: Payer: Self-pay | Admitting: Internal Medicine

## 2014-03-19 DIAGNOSIS — I10 Essential (primary) hypertension: Secondary | ICD-10-CM

## 2014-03-19 DIAGNOSIS — I25810 Atherosclerosis of coronary artery bypass graft(s) without angina pectoris: Secondary | ICD-10-CM

## 2014-03-19 MED ORDER — METOPROLOL TARTRATE 25 MG PO TABS: 25.0000 mg | ORAL_TABLET | Freq: Two times a day (BID) | ORAL | Status: DC

## 2014-03-19 MED ORDER — FUROSEMIDE 40 MG PO TABS: 40.0000 mg | ORAL_TABLET | Freq: Every day | ORAL | Status: DC

## 2014-03-19 MED ORDER — LISINOPRIL 2.5 MG PO TABS: 2.5000 mg | ORAL_TABLET | Freq: Every day | ORAL | Status: DC

## 2014-03-19 NOTE — Telephone Encounter (Signed)
Louis Ford from [facility] called stating his BP was 140/100 and states he is non compliant with his medication. Lm for her to call back.  Spoke w/pt.  He states he is having a hard time money wise now and can't get medications.  States he doesn't have refills on Metoprolol and Lisinopril and Lasix.  Advised will send refill request into pharmacy.  Advised that it was important to try to take his BP meds and maybe pharmacy could fill 1/2 of Rx until he could get the money.  He understands and will try to get the medication and take as prescribed.

## 2014-03-19 NOTE — Telephone Encounter (Signed)
New Message:  States pt's BP was 140/100 and that the pt has not been compliant with his medication.

## 2014-03-24 ENCOUNTER — Encounter: Payer: Self-pay | Admitting: *Deleted

## 2014-04-01 ENCOUNTER — Encounter: Payer: Self-pay | Admitting: Internal Medicine

## 2014-04-13 ENCOUNTER — Ambulatory Visit (INDEPENDENT_AMBULATORY_CARE_PROVIDER_SITE_OTHER): Payer: Medicare Other | Admitting: *Deleted

## 2014-04-13 DIAGNOSIS — I4891 Unspecified atrial fibrillation: Secondary | ICD-10-CM

## 2014-04-13 DIAGNOSIS — Z5181 Encounter for therapeutic drug level monitoring: Secondary | ICD-10-CM

## 2014-04-13 DIAGNOSIS — Z7901 Long term (current) use of anticoagulants: Secondary | ICD-10-CM

## 2014-04-13 LAB — POCT INR: INR: 1.9

## 2014-04-13 MED ORDER — WARFARIN SODIUM 5 MG PO TABS: ORAL_TABLET | ORAL | Status: DC

## 2014-04-16 ENCOUNTER — Telehealth: Payer: Self-pay | Admitting: Internal Medicine

## 2014-04-16 NOTE — Telephone Encounter (Signed)
New problem   Pt want to know status of [employer] transportation forms. Please call pt.

## 2014-04-16 NOTE — Telephone Encounter (Signed)
Pt's daughter in law Mariann Laster called, to find out if we had fax a form for [employer] for pt. Daughter in law said that this form was given to someone in the coumadin clinic when he came for an appointment on 4/21. This is the only form pt has , and pt would like to know if the form is ready. Pt needs this form so he can be picked up for his appointment. Pt and daughter in law are aware that i will send this message to anti-coag pool for review.

## 2014-04-16 NOTE — Telephone Encounter (Signed)
Follow up     Did we fax a form to [employer] for the patient so that they will pick him up for his appts?

## 2014-04-19 NOTE — Telephone Encounter (Signed)
I have form for Dr Lovena Le to sign

## 2014-04-20 ENCOUNTER — Encounter (HOSPITAL_COMMUNITY): Payer: Self-pay | Admitting: Emergency Medicine

## 2014-04-20 ENCOUNTER — Inpatient Hospital Stay (HOSPITAL_COMMUNITY)
Admission: EM | Admit: 2014-04-20 | Discharge: 2014-04-22 | DRG: 281 | Disposition: A | Discharge status: Home / Self Care | Payer: Medicare Other | Attending: Internal Medicine | Admitting: Internal Medicine

## 2014-04-20 ENCOUNTER — Emergency Department (HOSPITAL_COMMUNITY): Payer: Medicare Other

## 2014-04-20 ENCOUNTER — Telehealth: Payer: Self-pay | Admitting: Internal Medicine

## 2014-04-20 DIAGNOSIS — I5023 Acute on chronic systolic (congestive) heart failure: Secondary | ICD-10-CM

## 2014-04-20 DIAGNOSIS — D689 Coagulation defect, unspecified: Secondary | ICD-10-CM

## 2014-04-20 DIAGNOSIS — Z91199 Patient's noncompliance with other medical treatment and regimen due to unspecified reason: Secondary | ICD-10-CM

## 2014-04-20 DIAGNOSIS — R1310 Dysphagia, unspecified: Secondary | ICD-10-CM

## 2014-04-20 DIAGNOSIS — I2589 Other forms of chronic ischemic heart disease: Secondary | ICD-10-CM

## 2014-04-20 DIAGNOSIS — Z8673 Personal history of transient ischemic attack (TIA), and cerebral infarction without residual deficits: Secondary | ICD-10-CM

## 2014-04-20 DIAGNOSIS — Z5181 Encounter for therapeutic drug level monitoring: Secondary | ICD-10-CM

## 2014-04-20 DIAGNOSIS — R569 Unspecified convulsions: Secondary | ICD-10-CM

## 2014-04-20 DIAGNOSIS — Z9119 Patient's noncompliance with other medical treatment and regimen: Secondary | ICD-10-CM

## 2014-04-20 DIAGNOSIS — I5022 Chronic systolic (congestive) heart failure: Secondary | ICD-10-CM

## 2014-04-20 DIAGNOSIS — I255 Ischemic cardiomyopathy: Secondary | ICD-10-CM

## 2014-04-20 DIAGNOSIS — Z7901 Long term (current) use of anticoagulants: Secondary | ICD-10-CM

## 2014-04-20 DIAGNOSIS — Z6828 Body mass index (BMI) 28.0-28.9, adult: Secondary | ICD-10-CM

## 2014-04-20 DIAGNOSIS — Z8614 Personal history of Methicillin resistant Staphylococcus aureus infection: Secondary | ICD-10-CM

## 2014-04-20 DIAGNOSIS — R0600 Dyspnea, unspecified: Secondary | ICD-10-CM

## 2014-04-20 DIAGNOSIS — Z951 Presence of aortocoronary bypass graft: Secondary | ICD-10-CM

## 2014-04-20 DIAGNOSIS — I248 Other forms of acute ischemic heart disease: Secondary | ICD-10-CM | POA: Diagnosis present

## 2014-04-20 DIAGNOSIS — I4819 Other persistent atrial fibrillation: Secondary | ICD-10-CM | POA: Diagnosis present

## 2014-04-20 DIAGNOSIS — I2510 Atherosclerotic heart disease of native coronary artery without angina pectoris: Secondary | ICD-10-CM | POA: Diagnosis present

## 2014-04-20 DIAGNOSIS — D6859 Other primary thrombophilia: Secondary | ICD-10-CM | POA: Diagnosis present

## 2014-04-20 DIAGNOSIS — I10 Essential (primary) hypertension: Secondary | ICD-10-CM

## 2014-04-20 DIAGNOSIS — I4891 Unspecified atrial fibrillation: Secondary | ICD-10-CM | POA: Diagnosis present

## 2014-04-20 DIAGNOSIS — D682 Hereditary deficiency of other clotting factors: Secondary | ICD-10-CM | POA: Diagnosis present

## 2014-04-20 DIAGNOSIS — E669 Obesity, unspecified: Secondary | ICD-10-CM | POA: Diagnosis present

## 2014-04-20 DIAGNOSIS — I214 Non-ST elevation (NSTEMI) myocardial infarction: Secondary | ICD-10-CM

## 2014-04-20 DIAGNOSIS — G40909 Epilepsy, unspecified, not intractable, without status epilepticus: Secondary | ICD-10-CM | POA: Diagnosis present

## 2014-04-20 DIAGNOSIS — I2489 Other forms of acute ischemic heart disease: Secondary | ICD-10-CM | POA: Diagnosis present

## 2014-04-20 DIAGNOSIS — N289 Disorder of kidney and ureter, unspecified: Secondary | ICD-10-CM

## 2014-04-20 DIAGNOSIS — E785 Hyperlipidemia, unspecified: Secondary | ICD-10-CM | POA: Diagnosis present

## 2014-04-20 DIAGNOSIS — Z95810 Presence of automatic (implantable) cardiac defibrillator: Secondary | ICD-10-CM

## 2014-04-20 DIAGNOSIS — R040 Epistaxis: Secondary | ICD-10-CM

## 2014-04-20 DIAGNOSIS — Z87891 Personal history of nicotine dependence: Secondary | ICD-10-CM

## 2014-04-20 DIAGNOSIS — I25810 Atherosclerosis of coronary artery bypass graft(s) without angina pectoris: Secondary | ICD-10-CM

## 2014-04-20 LAB — COMPREHENSIVE METABOLIC PANEL
ALK PHOS: 128 U/L — AB (ref 39–117)
ALT: 36 U/L (ref 0–53)
AST: 41 U/L — ABNORMAL HIGH (ref 0–37)
Albumin: 4.2 g/dL (ref 3.5–5.2)
BUN: 20 mg/dL (ref 6–23)
CO2: 21 meq/L (ref 19–32)
Calcium: 9.3 mg/dL (ref 8.4–10.5)
Chloride: 102 mEq/L (ref 96–112)
Creatinine, Ser: 1.14 mg/dL (ref 0.50–1.35)
GFR calc Af Amer: 76 mL/min — ABNORMAL LOW (ref >90)
GFR calc non Af Amer: 66 mL/min — ABNORMAL LOW (ref >90)
Glucose, Bld: 78 mg/dL (ref 70–99)
Potassium: 4.5 mEq/L (ref 3.7–5.3)
SODIUM: 138 meq/L (ref 137–147)
Total Bilirubin: 0.5 mg/dL (ref 0.3–1.2)
Total Protein: 8.7 g/dL — ABNORMAL HIGH (ref 6.0–8.3)

## 2014-04-20 LAB — TSH: TSH: 1.9 u[IU]/mL (ref 0.350–4.500)

## 2014-04-20 LAB — PROTIME-INR
INR: 1.53 — AB (ref 0.00–1.49)
Prothrombin Time: 18 seconds — ABNORMAL HIGH (ref 11.6–15.2)

## 2014-04-20 LAB — CBC WITH DIFFERENTIAL/PLATELET
BASOS ABS: 0 10*3/uL (ref 0.0–0.1)
BASOS PCT: 0 % (ref 0–1)
Eosinophils Absolute: 0.1 10*3/uL (ref 0.0–0.7)
Eosinophils Relative: 2 % (ref 0–5)
HCT: 49.8 % (ref 39.0–52.0)
Hemoglobin: 16.4 g/dL (ref 13.0–17.0)
Lymphocytes Relative: 26 % (ref 12–46)
Lymphs Abs: 0.9 10*3/uL (ref 0.7–4.0)
MCH: 29 pg (ref 26.0–34.0)
MCHC: 32.9 g/dL (ref 30.0–36.0)
MCV: 88.1 fL (ref 78.0–100.0)
Monocytes Absolute: 0.4 10*3/uL (ref 0.1–1.0)
Monocytes Relative: 11 % (ref 3–12)
NEUTROS ABS: 2.1 10*3/uL (ref 1.7–7.7)
Neutrophils Relative %: 61 % (ref 43–77)
PLATELETS: 124 10*3/uL — AB (ref 150–400)
RBC: 5.65 MIL/uL (ref 4.22–5.81)
RDW: 14.8 % (ref 11.5–15.5)
WBC: 3.4 10*3/uL — ABNORMAL LOW (ref 4.0–10.5)

## 2014-04-20 LAB — I-STAT TROPONIN, ED: Troponin i, poc: 1.81 ng/mL (ref 0.00–0.08)

## 2014-04-20 LAB — TROPONIN I: TROPONIN I: 0.46 ng/mL — AB (ref <0.30)

## 2014-04-20 MED ORDER — METOPROLOL TARTRATE 25 MG PO TABS: 50.0000 mg | ORAL_TABLET | Freq: Two times a day (BID) | ORAL | Status: DC

## 2014-04-20 MED ORDER — LISINOPRIL 5 MG PO TABS: 5.0000 mg | ORAL_TABLET | Freq: Every day | ORAL | Status: DC

## 2014-04-20 MED ORDER — PNEUMOCOCCAL VAC POLYVALENT 25 MCG/0.5ML IJ INJ
0.5000 mL | INJECTION | INTRAMUSCULAR | Status: AC
  Administered 2014-04-21: 0.5 mL via INTRAMUSCULAR
  Filled 2014-04-20: qty 0.5

## 2014-04-20 MED ORDER — CARVEDILOL 12.5 MG PO TABS: 12.5000 mg | ORAL_TABLET | Freq: Two times a day (BID) | ORAL | Status: DC

## 2014-04-20 MED ORDER — WARFARIN SODIUM 7.5 MG PO TABS
7.5000 mg | ORAL_TABLET | Freq: Every day | ORAL | Status: DC
  Administered 2014-04-20: 7.5 mg via ORAL
  Filled 2014-04-20 (×2): qty 1

## 2014-04-20 MED ORDER — HEPARIN (PORCINE) IN NACL 100-0.45 UNIT/ML-% IJ SOLN
1200.0000 [IU]/h | INTRAMUSCULAR | Status: DC
  Administered 2014-04-20: 1200 [IU]/h via INTRAVENOUS
  Administered 2014-04-21 (×2): 1300 [IU]/h via INTRAVENOUS
  Administered 2014-04-22: 1200 [IU]/h via INTRAVENOUS
  Filled 2014-04-20 (×4): qty 250

## 2014-04-20 MED ORDER — CARVEDILOL 12.5 MG PO TABS
12.5000 mg | ORAL_TABLET | Freq: Two times a day (BID) | ORAL | Status: DC
  Administered 2014-04-20: 12.5 mg via ORAL
  Filled 2014-04-20 (×4): qty 1

## 2014-04-20 MED ORDER — SODIUM CHLORIDE 0.9 % IV SOLN: 250.0000 mL | INTRAVENOUS | Status: DC | PRN

## 2014-04-20 MED ORDER — LISINOPRIL 5 MG PO TABS
5.0000 mg | ORAL_TABLET | Freq: Every day | ORAL | Status: DC
  Administered 2014-04-20 – 2014-04-22 (×3): 5 mg via ORAL
  Filled 2014-04-20 (×3): qty 1

## 2014-04-20 MED ORDER — SODIUM CHLORIDE 0.9 % IJ SOLN: 3.0000 mL | INTRAMUSCULAR | Status: DC | PRN

## 2014-04-20 MED ORDER — FUROSEMIDE 40 MG PO TABS: 40.0000 mg | ORAL_TABLET | Freq: Every day | ORAL | Status: DC

## 2014-04-20 MED ORDER — WARFARIN - PHYSICIAN DOSING INPATIENT
Freq: Every day | Status: DC
  Administered 2014-04-21: 18:00:00

## 2014-04-20 MED ORDER — PHENYTOIN SODIUM EXTENDED 100 MG PO CAPS
100.0000 mg | ORAL_CAPSULE | Freq: Every day | ORAL | Status: DC
  Administered 2014-04-20 – 2014-04-21 (×2): 100 mg via ORAL
  Filled 2014-04-20 (×3): qty 1

## 2014-04-20 MED ORDER — FUROSEMIDE 40 MG PO TABS
40.0000 mg | ORAL_TABLET | Freq: Every day | ORAL | Status: DC
  Administered 2014-04-20 – 2014-04-22 (×3): 40 mg via ORAL
  Filled 2014-04-20 (×3): qty 1

## 2014-04-20 MED ORDER — PHENYTOIN SODIUM EXTENDED 100 MG PO CAPS: 100.0000 mg | ORAL_CAPSULE | Freq: Every day | ORAL | Status: DC

## 2014-04-20 MED ORDER — SODIUM CHLORIDE 0.9 % IJ SOLN
3.0000 mL | Freq: Two times a day (BID) | INTRAMUSCULAR | Status: DC
  Administered 2014-04-20 – 2014-04-22 (×3): 3 mL via INTRAVENOUS

## 2014-04-20 NOTE — ED Notes (Signed)
Troponin 0.46.  Dr. Audie Pinto made aware of critical value.

## 2014-04-20 NOTE — H&P (Signed)
ELECTROPHYSIOLOGY HISTORY AND PHYSICAL    Patient ID: Louis Ford MRN: [id number], DOB/AGE: 02-28-1946 68 y.o.  Admit date: 04/20/2014 Date of Consult: 04/20/2014  Primary Physician: Antonietta Jewel, MD Electrophysiologist: Lovena Le  Reason for Consultation: ICD shocks  HPI:  Louis Ford is a 68 y.o. male with a past medical history significant for ischemic cardiomyopathy, chronic systolic heart failure and atrial fibrillation.  He had a Biotronik single chamber ICD implanted in 2006 with most recent gen change 03/2011.  He reports that he has had a hard time affording his medications and a few weeks ago was without his metoprolol, lisinopril, and lasix for several days.  He has been back on his medications but missed his doses this morning.  Today around noon, he received 9 ICD shocks and presented to the ER for further evaluation.  Device interrogation demonstrates inappropriate therapy for afib with RVR.  Device function otherwise normal.  He denies recent chest pain, palpitations, shortness of breath, or other concerns.  He does state that he has been under increased emotional stress lately because of his living situation - he is working on finding a different place to live that would be less stressful.     Lab work is notable for subtherapeutic INR, AST 41, Alk Phos 128, WBC 3.4, platelets 124, trop 0.46.   Last echo 09/2012 demonstrated EF 20-25%, mild to moderate MR.   EP has been asked to evaluate for treatment options. ROS is negative except as outlined above.   Past Medical History  Diagnosis Date  . CAD (coronary artery disease)   . Chronic systolic heart failure   . Ischemic cardiomyopathy     EF 15 to 20% by TTE and TEE in 09/2012.  Severe LV dysfunction  . HTN (hypertension)   . Hyperlipidemia   . Obesity     BMI 31 in 09/2012  . Atrial fibrillation   09/22/2012  . implantable cardiac defibrillator-Biotronik     Device Implanted 2006; s/p gen change 03/2011 : bleeding  persistent with pocket erosion and infection; explant and reimplant  06/2011  . Stroke   . 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 History of MRSA infection 05/2011  . Seizure disorder latest 09/30/2012  . 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 Seizures      Surgical History:  Past Surgical History  Procedure Laterality Date  . Coronary artery bypass graft    . Tee without cardioversion  09/24/2012    Procedure: TRANSESOPHAGEAL ECHOCARDIOGRAM (TEE);  Surgeon: [name], MD;  Location: [place];  Service: Cardiovascular;  Laterality: N/A;  dave/anesth, dl, cindy/echo   . Cardioversion  09/24/2012    Procedure: CARDIOVERSION;  Surgeon: [name], MD;  Location: [place];  Service: Cardiovascular;  Laterality: N/A;  . Icd        Current outpatient prescriptions: furosemide (LASIX) 40 MG tablet, Take 1 tablet (40 mg total) by mouth daily., Disp: 30 tablet, Rfl: 6;  lisinopril (PRINIVIL,ZESTRIL) 2.5 MG tablet, Take 1 tablet (2.5 mg total) by mouth daily., Disp: 30 tablet, Rfl: 6;   metoprolol tartrate (LOPRESSOR) 25 MG tablet, Take 1 tablet (25 mg total) by mouth 2 (two) times daily., Disp: 30 tablet, Rfl: 6 phenytoin (DILANTIN) 100 MG ER capsule, Take 100 mg by mouth daily., Disp: , Rfl: ;   warfarin (COUMADIN) 5 MG tablet, Take 5 mg by mouth daily., Disp: , Rfl:    Allergies: No Known Allergies  History   Social History  . Marital Status: Single  Spouse Name: N/A    Number of Children: N/A  . Years of Education: N/A   Occupational History  . Not on file.   Social History Main Topics  . Smoking status: Former Smoker    Types: Pipe    Quit date: 09/21/2008  . Smokeless tobacco: Former Systems developer    Quit date: 09/21/2008  . Alcohol Use: No  . Drug Use: No  . Sexual Activity: No   Other Topics Concern  . Not on file   Social History Narrative  . No narrative on file    Family history - no known early CAD  Physical Exam: Filed Vitals:   04/20/14 1515 04/20/14  1530 04/20/14 1612 04/20/14 1705  BP: 158/105 166/128 152/104 164/114  Pulse: 77 74 74 81  Temp:      TempSrc:      Resp: 24 20 18 18   SpO2: 100% 92% 100% 99%    GEN- The patient is well appearing, alert and oriented x 3 today.   Head- normocephalic, atraumatic Eyes-  Sclera clear, conjunctiva pink Ears- hearing intact Oropharynx- clear Neck- supple, no JVP Lungs- Clear to ausculation bilaterally, normal work of breathing Chest- ICD pocket is well healed Heart- Regular rate and rhythm, 2/6 LLSB GI- soft, NT, ND, + BS Extremities- no clubbing, cyanosis, or edema MS- no significant deformity or atrophy Skin- no rash or lesion Psych- euthymic mood, full affect Neuro- strength and sensation are intact   Labs:   Lab Results  Component Value Date   WBC 3.4* 04/20/2014   HGB 16.4 04/20/2014   HCT 49.8 04/20/2014   MCV 88.1 04/20/2014   PLT 124* 04/20/2014    Recent Labs Lab 04/20/14 1433  NA 138  K 4.5  CL 102  CO2 21  BUN 20  CREATININE 1.14  CALCIUM 9.3  PROT 8.7*  BILITOT 0.5  ALKPHOS 128*  ALT 36  AST 41*  GLUCOSE 78     Radiology/Studies: Dg Chest Portable 1 View 04/20/2014   CLINICAL DATA:  Atrial fibrillation  EXAM: PORTABLE CHEST - 1 VIEW  COMPARISON:  Prior chest x-ray 09/28/2012  FINDINGS: Stable cardiac and mediastinal contours. Cardiomegaly. Aortic atherosclerosis. Patient is status post median sternotomy with multivessel CABG. Right subclavian approach single lead cardiac rhythm maintenance device. A lead projects over the right ventricle. No focal airspace consolidation, pleural effusion, pulmonary edema or pneumothorax.  IMPRESSION: Stable chest x-ray, no acute cardiopulmonary process.   Electronically Signed   By: Jacqulynn Cadet M.D.   On: 04/20/2014 15:17    BOE:RQSXQ rhythm, LVH, normal intervals, rate 86  TELEMETRY: sinus rhythm  DEVICE HISTORY: Biotronik single chamber ICD implanted in 2006 with explantation and re-implantation 03/2011 on  contralateral side due to pocket erosion - both procedures done in [place]  Assessment/Plan:  1. afib with RVR The patient presents with ICD shocks secondary to afib with RVR.  He had very rapidly conducting afib.  This is likely due to medicine noncompliance.  I will admit for overnight observation.  Given noncompliance, he is a poor candidate for AAD therapy.  Sotalol may be an option if needed.  For now, I will switch metoprolol to coreg and titrate. chads2vasc score is at least 6.  He also had factor V Leiden deficiency.  I will therefore continue coumadin and place on heparin drip while subtherapeutic. I have reprogrammed his ICD to minimize ICD shocks therapy by increasing NIDs in the VT2 and VF zones.  I will plan to turn VT2  zone off and have single VF zone at 230 bpm.  2. Chronic systolic dysfunction euvolemic Continue home medicines Increase lisinopril  3. htn Above goal Switch metoprolol to coreg and titrate Titrate lisinopril  Hope to discharge in 23 hours

## 2014-04-20 NOTE — Telephone Encounter (Signed)
Pt called stating needs this form signed as soon as possible.Please have Dr Lovena Le sign and call pt so he can pick up. Thank you Elbert Ewings

## 2014-04-20 NOTE — ED Notes (Signed)
Rep from [employer] called and reported they would be here within an hour. Rep has name, dob, room number of PT

## 2014-04-20 NOTE — Telephone Encounter (Signed)
[employer] paperwork faxed pt aware ready For Pick up

## 2014-04-20 NOTE — ED Notes (Signed)
Transferred care to [facility]

## 2014-04-20 NOTE — ED Provider Notes (Signed)
CSN: [id number]     Arrival date & time 04/20/14  1421 History   First MD Initiated Contact with Patient 04/20/14 1425     Chief Complaint  Patient presents with  . Atrial Fibrillation      HPI Per EMS PT got into argument with person he's living with, felt defib go off. Hx of Afib ~5yrs. Afib 80-130. No SOB/diaphoresis/n/v. Pain at defib site only. 160/120BP -didn't take BP meds today  Past Medical History  Diagnosis Date  . CAD (coronary artery disease)   . Chronic systolic heart failure   . Ischemic cardiomyopathy     EF 15 to 20% by TTE and TEE in 09/2012.  Severe LV dysfunction  . HTN (hypertension)   . Hyperlipidemia   . Obesity     BMI 31 in 09/2012  . Atrial fibrillation   09/22/2012  . implantable cardiac defibrillator-Biotronik     Device Implanted 2006; s/p gen change 03/2011 : bleeding persistent with pocket erosion and infection; explant and reimplant  06/2011  . Stroke   . 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 History of MRSA infection 05/2011  . Seizure disorder latest 09/30/2012  . 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 Seizures    Past Surgical History  Procedure Laterality Date  . Coronary artery bypass graft    . Tee without cardioversion  09/24/2012    Procedure: TRANSESOPHAGEAL ECHOCARDIOGRAM (TEE);  Surgeon: [name], MD;  Location: [facility];  Service: Cardiovascular;  Laterality: N/A;  dave/anesth, dl, cindy/echo   . Cardioversion  09/24/2012    Procedure: CARDIOVERSION;  Surgeon: [name], MD;  Location: [facility] ENDOSCOPY;  Service: Cardiovascular;  Laterality: N/A;  . Icd     No family history on file. History  Substance Use Topics  . Smoking status: Former Smoker    Types: Pipe    Quit date: 09/21/2008  . Smokeless tobacco: Former Systems developer    Quit date: 09/21/2008  . Alcohol Use: No    Review of Systems  All other systems reviewed and are negative  Allergies  Review of patient's allergies indicates no known allergies.  Home Medications    Prior to Admission medications   Medication Sig Start Date End Date Taking? Authorizing Provider  furosemide (LASIX) 40 MG tablet Take 1 tablet (40 mg total) by mouth daily. 03/19/14   [name], MD  lisinopril (PRINIVIL,ZESTRIL) 2.5 MG tablet Take 1 tablet (2.5 mg total) by mouth daily. 03/19/14   [name], MD  metoprolol tartrate (LOPRESSOR) 25 MG tablet Take 1 tablet (25 mg total) by mouth 2 (two) times daily. 03/19/14   [name], MD  phenytoin (DILANTIN) 100 MG ER capsule Take 100 mg by mouth daily.    Historical Provider, MD  warfarin (COUMADIN) 5 MG tablet Take as directed by coumadin clinic 04/13/14   [name], MD   BP 101/63  Pulse 58  Temp(Src) 97.9 F (36.6 C) (Oral)  Resp 18  Ht 5\' 11"  (1.803 m)  Wt 202 lb 14.4 oz (92.035 kg)  BMI 28.31 kg/m2  SpO2 100% Physical Exam  Nursing note and vitals reviewed. Constitutional: He is oriented to person, place, and time. He appears well-developed and well-nourished. No distress.  HENT:  Head: Normocephalic and atraumatic.  Eyes: Pupils are equal, round, and reactive to light.  Neck: Normal range of motion.  Cardiovascular: Normal rate and intact distal pulses.   Pulmonary/Chest: No respiratory distress.  Abdominal: Normal appearance. He exhibits no distension.  There is no tenderness. There is no rebound.  Musculoskeletal: Normal range of motion.  Neurological: He is alert and oriented to person, place, and time. No cranial nerve deficit.  Skin: Skin is warm and dry. No rash noted.  Psychiatric: He has a normal mood and affect. His behavior is normal.    ED Course  Procedures (including critical care time)  CRITICAL CARE Performed by: Dot Lanes Total critical care time: 30 min Critical care time was exclusive of separately billable procedures and treating other patients. Critical care was necessary to treat or prevent imminent or life-threatening deterioration. Critical care was time spent  personally by me on the following activities: development of treatment plan with patient and/or surrogate as well as nursing, discussions with consultants, evaluation of patient's response to treatment, examination of patient, obtaining history from patient or surrogate, ordering and performing treatments and interventions, ordering and review of laboratory studies, ordering and review of radiographic studies, pulse oximetry and re-evaluation of patient's condition.  Labs Review Labs Reviewed  COMPREHENSIVE METABOLIC PANEL - Abnormal; Notable for the following:    Total Protein 8.7 (*)    AST 41 (*)    Alkaline Phosphatase 128 (*)    GFR calc non Af Amer 66 (*)    GFR calc Af Amer 76 (*)    All other components within normal limits  CBC WITH DIFFERENTIAL - Abnormal; Notable for the following:    WBC 3.4 (*)    Platelets 124 (*)    All other components within normal limits  TROPONIN I - Abnormal; Notable for the following:    Troponin I 0.46 (*)    All other components within normal limits  PROTIME-INR - Abnormal; Notable for the following:    Prothrombin Time 18.0 (*)    INR 1.53 (*)    All other components within normal limits  BASIC METABOLIC PANEL - Abnormal; Notable for the following:    Creatinine, Ser 1.42 (*)    GFR calc non Af Amer 50 (*)    GFR calc Af Amer 58 (*)    All other components within normal limits  PROTIME-INR - Abnormal; Notable for the following:    Prothrombin Time 18.1 (*)    INR 1.54 (*)    All other components within normal limits  CBC - Abnormal; Notable for the following:    Platelets 119 (*)    All other components within normal limits  HEPARIN LEVEL (UNFRACTIONATED) - Abnormal; Notable for the following:    Heparin Unfractionated 0.73 (*)    All other components within normal limits  CBC - Abnormal; Notable for the following:    Platelets 101 (*)    All other components within normal limits  PROTIME-INR - Abnormal; Notable for the following:     Prothrombin Time 24.0 (*)    INR 2.23 (*)    All other components within normal limits  I-STAT TROPOININ, ED - Abnormal; Notable for the following:    Troponin i, poc 1.81 (*)    All other components within normal limits  TSH  T4, FREE  HEPARIN LEVEL (UNFRACTIONATED)  HEPARIN LEVEL (UNFRACTIONATED)    Imaging Review Nm Myocar Multi W/spect W/wall Motion / Ef  04/21/2014   CLINICAL DATA:  Chest pain  EXAM: MYOCARDIAL IMAGING WITH SPECT (REST AND PHARMACOLOGIC-STRESS)  GATED LEFT VENTRICULAR WALL MOTION STUDY  LEFT VENTRICULAR EJECTION FRACTION  TECHNIQUE: Standard myocardial SPECT imaging was performed after resting intravenous injection of 10 mCi Tc-2m sestamibi. Subsequently, intravenous  infusion of Lexiscan was performed under the supervision of the Cardiology staff. At peak effect of the drug, 30 mCi Tc-10m sestamibi was injected intravenously and standard myocardial SPECT imaging was performed. Quantitative gated imaging was also performed to evaluate left ventricular wall motion, and estimate left ventricular ejection fraction.  COMPARISON:  None.  FINDINGS: SPECT: Scar at the apex and septal apex is noted. There is a perfusion defects involving the inferolateral wall on stress imaging with sparing of the lateral apex at re- perfuses on rest imaging. Gated images demonstrate diminished motion and thickening in this region. Although, mid wall and basilar ischemia would be unusual, a cannot be excluded.  Wall motion: Inferolateral hypokinesis.  Ejection fraction: 36%. End-diastolic volume 676 cc. End systolic volume 86 cc.  IMPRESSION: Inferolateral wall ischemia cannot be excluded. See above discussion  Apical scar.   Electronically Signed   By: Maryclare Bean M.D.   On: 04/21/2014 16:11   Dg Chest Portable 1 View  04/20/2014   CLINICAL DATA:  Atrial fibrillation  EXAM: PORTABLE CHEST - 1 VIEW  COMPARISON:  Prior chest x-ray 09/28/2012  FINDINGS: Stable cardiac and mediastinal contours.  Cardiomegaly. Aortic atherosclerosis. Patient is status post median sternotomy with multivessel CABG. Right subclavian approach single lead cardiac rhythm maintenance device. A lead projects over the right ventricle. No focal airspace consolidation, pleural effusion, pulmonary edema or pneumothorax.  IMPRESSION: Stable chest x-ray, no acute cardiopulmonary process.   Electronically Signed   By: Jacqulynn Cadet M.D.   On: 04/20/2014 15:17     EKG Interpretation   Date/Time:  Tuesday April 20 2014 14:39:11 EDT Ventricular Rate:  86 PR Interval:  157 QRS Duration: 94 QT Interval:  408 QTC Calculation: 488 R Axis:   -8 Text Interpretation:  Sinus rhythm Biatrial enlargement LVH with secondary  repolarization abnormality Anterior ST elevation, probably due to LVH  Borderline prolonged QT interval No significant change since last tracing  Confirmed by Keymani Mclean  MD, Aara Jacquot ([id number]) on 04/20/2014 4:06:08 PM      MDM   Final diagnoses:  Atrial fibrillation with RVR  Automatic implantable cardiac defibrillator in situ  Chronic systolic heart failure  HTN (hypertension)  Ischemic cardiomyopathy  Long term (current) use of anticoagulants  Accelerated hypertension  CAD (coronary artery disease)  NSTEMI (non-ST elevated myocardial infarction)  CAD (coronary artery disease) of artery bypass graft  Atrial fibrillation  Afib  Dyspnea  CAD (coronary artery disease) s/p cabg  Atrial fibrillation    Acute on chronic systolic heart failure  Odynophagia  Epistaxis  Ischemic cardiomyopathy  ? additional rate component  implantable cardiac defibrillator-Biotronik  Seizure  Renal insufficiency  Coagulopathy  Factor VII deficiency  Encounter for therapeutic drug monitoring        Dot Lanes, MD 04/22/14 1341

## 2014-04-20 NOTE — Progress Notes (Signed)
ANTICOAGULATION CONSULT NOTE - Initial Consult  Pharmacy Consult for Heparin  Indication: atrial fibrillation / Factor V Leiden Deficiency   No Known Allergies  Patient Measurements: Height: 5' 10.08" (178 cm) Weight: 198 lb (89.812 kg) (Bed weight ) IBW/kg (Calculated) : 73.18 Heparin Dosing Weight: n/a   Vital Signs: Temp: 98.7 F (37.1 C) (04/28 1446) Temp src: Oral (04/28 1446) BP: 164/114 mmHg (04/28 1705) Pulse Rate: 81 (04/28 1705)  Labs:  Recent Labs  04/20/14 1433  HGB 16.4  HCT 49.8  PLT 124*  LABPROT 18.0*  INR 1.53*  CREATININE 1.14  TROPONINI 0.46*    Estimated Creatinine Clearance: 72.9 ml/min (by C-G formula based on Cr of 1.14).   Medical History: Past Medical History  Diagnosis Date  . CAD (coronary artery disease)   . Chronic systolic heart failure   . Ischemic cardiomyopathy     EF 15 to 20% by TTE and TEE in 09/2012.  Severe LV dysfunction  . HTN (hypertension)   . Hyperlipidemia   . Obesity     BMI 31 in 09/2012  . Atrial fibrillation   09/22/2012  . implantable cardiac defibrillator-Biotronik     Device Implanted 2006; s/p gen change 03/2011 : bleeding persistent with pocket erosion and infection; explant and reimplant  06/2011  . Stroke   . 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 History of MRSA infection 05/2011  . Seizure disorder latest 09/30/2012  . 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 Seizures     Medications:   (Not in a hospital admission)  Assessment: 67 YOM to start on heparin bridge on for Afib and Factor V Leiden deficiency. He was on coumadin pta but his INR was sub-therapeutic today at 1.53. MD dosing coumadin. Hgb 16.4, Plt 125, CHADSVASc = 6 per MD.   PTA Coumadin dose: 5 mg daily   Goal of Therapy:  Heparin level 0.3-0.7 units/ml Monitor platelets by anticoagulation protocol: Yes   Plan:  Start heparin infusion at 1200 units/hr. No bolus.  Check anti-Xa level in 6 hours and daily while on heparin Continue to  monitor H&H and platelets  Albertina Parr, PharmD.  Clinical Pharmacist Pager [phone]

## 2014-04-20 NOTE — Progress Notes (Addendum)
Pt. HR elevated with activity. Pts. Rhythm now in NSR with frequent irregular beats. On call MD paged. RN will continue to monitor pt. Barton Dubois Return call from On call MD received. No new orders at this time. RN will continue to monitor pt. For changes condition.

## 2014-04-20 NOTE — ED Notes (Signed)
Elevated I-stat Troponin reported to Dr. Audie Pinto

## 2014-04-20 NOTE — ED Notes (Signed)
Per representative from [employer] pt's ICD went off 9 times.

## 2014-04-20 NOTE — ED Notes (Signed)
Called [employer] to have local rep come and interrogate PT's defibrilator

## 2014-04-20 NOTE — Progress Notes (Signed)
Pt arrived to floor from ED via stretch @ 1900.  A&0x3.  Oriented to surroundings. Denies pain or discomfort.  Instructed to call for assist. Call bell at reach.  Verbalized understanding.  Karie Kirks, Therapist, sports.

## 2014-04-20 NOTE — ED Notes (Addendum)
Per EMS PT got into argument with person he's living with, felt defib go off 4x. Hx of Afib ~80yrs. Afib 80-130. No SOB/diaphoresis/n/v. Pain at defib site only. 160/120BP -didn't take BP meds today 98% RA, EKG unremarkable. PT reports defib goes off Q2-54months. Call son in law [phone] Tressie Stalker to let him know PT is here in ED

## 2014-04-21 ENCOUNTER — Observation Stay (HOSPITAL_COMMUNITY): Payer: Medicare Other

## 2014-04-21 DIAGNOSIS — I4819 Other persistent atrial fibrillation: Secondary | ICD-10-CM

## 2014-04-21 DIAGNOSIS — I369 Nonrheumatic tricuspid valve disorder, unspecified: Secondary | ICD-10-CM

## 2014-04-21 DIAGNOSIS — R079 Chest pain, unspecified: Secondary | ICD-10-CM

## 2014-04-21 DIAGNOSIS — I2510 Atherosclerotic heart disease of native coronary artery without angina pectoris: Secondary | ICD-10-CM

## 2014-04-21 DIAGNOSIS — I10 Essential (primary) hypertension: Secondary | ICD-10-CM

## 2014-04-21 DIAGNOSIS — I214 Non-ST elevation (NSTEMI) myocardial infarction: Secondary | ICD-10-CM

## 2014-04-21 HISTORY — DX: Other persistent atrial fibrillation: I48.19

## 2014-04-21 LAB — CBC
HEMATOCRIT: 49.2 % (ref 39.0–52.0)
HEMOGLOBIN: 15.7 g/dL (ref 13.0–17.0)
MCH: 28.6 pg (ref 26.0–34.0)
MCHC: 31.9 g/dL (ref 30.0–36.0)
MCV: 89.6 fL (ref 78.0–100.0)
Platelets: 119 10*3/uL — ABNORMAL LOW (ref 150–400)
RBC: 5.49 MIL/uL (ref 4.22–5.81)
RDW: 14.9 % (ref 11.5–15.5)
WBC: 4.4 10*3/uL (ref 4.0–10.5)

## 2014-04-21 LAB — PROTIME-INR
INR: 1.54 — AB (ref 0.00–1.49)
Prothrombin Time: 18.1 seconds — ABNORMAL HIGH (ref 11.6–15.2)

## 2014-04-21 LAB — BASIC METABOLIC PANEL
BUN: 20 mg/dL (ref 6–23)
CHLORIDE: 103 meq/L (ref 96–112)
CO2: 26 mEq/L (ref 19–32)
Calcium: 9.2 mg/dL (ref 8.4–10.5)
Creatinine, Ser: 1.42 mg/dL — ABNORMAL HIGH (ref 0.50–1.35)
GFR calc Af Amer: 58 mL/min — ABNORMAL LOW (ref >90)
GFR calc non Af Amer: 50 mL/min — ABNORMAL LOW (ref >90)
Glucose, Bld: 79 mg/dL (ref 70–99)
POTASSIUM: 4.5 meq/L (ref 3.7–5.3)
SODIUM: 142 meq/L (ref 137–147)

## 2014-04-21 LAB — T4, FREE: Free T4: 0.94 ng/dL (ref 0.80–1.80)

## 2014-04-21 LAB — HEPARIN LEVEL (UNFRACTIONATED)
Heparin Unfractionated: 0.3 IU/mL (ref 0.30–0.70)
Heparin Unfractionated: 0.61 IU/mL (ref 0.30–0.70)

## 2014-04-21 MED ORDER — CARVEDILOL 25 MG PO TABS
25.0000 mg | ORAL_TABLET | Freq: Two times a day (BID) | ORAL | Status: DC
  Administered 2014-04-21 – 2014-04-22 (×2): 25 mg via ORAL
  Filled 2014-04-21 (×5): qty 1

## 2014-04-21 MED ORDER — TECHNETIUM TC 99M SESTAMIBI GENERIC - CARDIOLITE
10.0000 | Freq: Once | INTRAVENOUS | Status: AC | PRN
  Administered 2014-04-21: 10 via INTRAVENOUS

## 2014-04-21 MED ORDER — TECHNETIUM TC 99M SESTAMIBI GENERIC - CARDIOLITE
30.0000 | Freq: Once | INTRAVENOUS | Status: AC | PRN
  Administered 2014-04-21: 30 via INTRAVENOUS

## 2014-04-21 MED ORDER — ACETAMINOPHEN 325 MG PO TABS
650.0000 mg | ORAL_TABLET | Freq: Four times a day (QID) | ORAL | Status: DC | PRN
  Administered 2014-04-21: 650 mg via ORAL
  Filled 2014-04-21: qty 2

## 2014-04-21 MED ORDER — OXYCODONE-ACETAMINOPHEN 5-325 MG PO TABS
1.0000 | ORAL_TABLET | Freq: Four times a day (QID) | ORAL | Status: DC | PRN
  Administered 2014-04-21: 1 via ORAL
  Filled 2014-04-21: qty 1

## 2014-04-21 MED ORDER — WARFARIN SODIUM 10 MG PO TABS
10.0000 mg | ORAL_TABLET | Freq: Once | ORAL | Status: AC
  Administered 2014-04-21: 10 mg via ORAL
  Filled 2014-04-21: qty 1

## 2014-04-21 MED ORDER — REGADENOSON 0.4 MG/5ML IV SOLN
0.4000 mg | Freq: Once | INTRAVENOUS | Status: AC
  Administered 2014-04-21: 0.4 mg via INTRAVENOUS
  Filled 2014-04-21: qty 5

## 2014-04-21 MED ORDER — REGADENOSON 0.4 MG/5ML IV SOLN
INTRAVENOUS | Status: AC
  Administered 2014-04-21: 0.4 mg via INTRAVENOUS
  Filled 2014-04-21: qty 5

## 2014-04-21 NOTE — Progress Notes (Signed)
Pt's BP 125/95 asymptomatic.  Scheduled for  coreg at  8am not given as pt was NPO and had to get myo view exam. Back to floor this afternoon informed Amber PA, and instructed ok to wait until 5pm dose.  Will continue to monitor.  Daylan Boggess, Therapist, sports.

## 2014-04-21 NOTE — Progress Notes (Signed)
SUBJECTIVE: The patient is complaining of headache and pain around ICD site today.  No shortness of breath or other chest pain.  He remains quite upset about his housing situation.  Trop 1.81 last night at 6:40pm.  Other lab work reviewed.   CURRENT MEDICATIONS: . carvedilol  12.5 mg Oral BID WC  . furosemide  40 mg Oral Daily  . lisinopril  5 mg Oral Daily  . phenytoin  100 mg Oral Daily  . pneumococcal 23 valent vaccine  0.5 mL Intramuscular Tomorrow-1000  . sodium chloride  3 mL Intravenous Q12H  . warfarin  7.5 mg Oral q1800  . Warfarin - Physician Dosing Inpatient   Does not apply q1800   . heparin 1,300 Units/hr (04/21/14 0450)    OBJECTIVE: Physical Exam: Filed Vitals:   04/20/14 1730 04/20/14 1940 04/20/14 2326 04/21/14 0506  BP: 152/106 167/97 139/100 118/87  Pulse: 70 97 71 70  Temp:  97.9 F (36.6 C) 98.1 F (36.7 C) 97.7 F (36.5 C)  TempSrc:  Oral Oral Oral  Resp:  20 18 18   Height:  5\' 11"  (1.803 m)    Weight:  208 lb 9.6 oz (94.62 kg)  204 lb 4.8 oz (92.67 kg)  SpO2: 98% 99% 100% 98%    Intake/Output Summary (Last 24 hours) at 04/21/14 0631 Last data filed at 04/21/14 0507  Gross per 24 hour  Intake  340.6 ml  Output    500 ml  Net -159.4 ml    Telemetry reveals sinus rhythm with PAC's, PVC's, run of afib  GEN- The patient is well appearing, alert and oriented x 3 today.   Head- normocephalic, atraumatic Eyes-  Sclera clear, conjunctiva pink Ears- hearing intact Oropharynx- clear Neck- supple, no JVP Lymph- no cervical lymphadenopathy Lungs- Clear to ausculation bilaterally, normal work of breathing Heart- irregular rate and rhythm, 2/6 SEM LLSB GI- soft, NT, ND, + BS Extremities- no clubbing, cyanosis, or edema Neuro- strength/ sensation are intact  LABS: Basic Metabolic Panel:  Recent Labs  04/20/14 1433 04/21/14 0005  NA 138 142  K 4.5 4.5  CL 102 103  CO2 21 26  GLUCOSE 78 79  BUN 20 20  CREATININE 1.14 1.42*  CALCIUM 9.3  9.2   Liver Function Tests:  Recent Labs  04/20/14 1433  AST 41*  ALT 36  ALKPHOS 128*  BILITOT 0.5  PROT 8.7*  ALBUMIN 4.2   CBC:  Recent Labs  04/20/14 1433 04/21/14 0005  WBC 3.4* 4.4  NEUTROABS 2.1  --   HGB 16.4 15.7  HCT 49.8 49.2  MCV 88.1 89.6  PLT 124* 119*   Cardiac Enzymes:  Recent Labs  04/20/14 1433  TROPONINI 0.46*   Thyroid Function Tests:  Recent Labs  04/20/14 1835  TSH 1.900    RADIOLOGY: Dg Chest Portable 1 View 04/20/2014   CLINICAL DATA:  Atrial fibrillation  EXAM: PORTABLE CHEST - 1 VIEW  COMPARISON:  Prior chest x-ray 09/28/2012  FINDINGS: Stable cardiac and mediastinal contours. Cardiomegaly. Aortic atherosclerosis. Patient is status post median sternotomy with multivessel CABG. Right subclavian approach single lead cardiac rhythm maintenance device. A lead projects over the right ventricle. No focal airspace consolidation, pleural effusion, pulmonary edema or pneumothorax.  IMPRESSION: Stable chest x-ray, no acute cardiopulmonary process.   Electronically Signed   By: Jacqulynn Cadet M.D.   On: 04/20/2014 15:17    ASSESSMENT AND PLAN:  Active Problems:   Atrial fibrillation  1. afib Continue heparin gtt Coumadin Increase  coreg for better rate control Echo to evaluate for structural changes  2. NSTEMI I suspect that this is direct muscle injury from ICD shocks.  Also demand ischemia from afib with RVR. Will obtain a lexiscan myoview to evaluate for ischemia as the cause.  IF low risk, will manage medically.  3. htn Above goal Increase coreg  Will continue to observe until tomorrow

## 2014-04-21 NOTE — Progress Notes (Signed)
Pt. C/o headache this am. On call Dr. Angelena Form made aware. New orders received. RN will implement as ordered and continue to monitor for changes in conditions. Louis Ford

## 2014-04-21 NOTE — Progress Notes (Signed)
Pt. With elevated BP.No s/s of distress or discomfort noted.  On call MD, Yousuf, made aware. No new orders received. RN will continue to monitor pt. For changes in condition. Gillis Santa Charlee Squibb

## 2014-04-21 NOTE — Progress Notes (Signed)
Pt NPO for Myo view this am and instructed by Pamala Hurry in test lab not to give any meds until after test.  P made aware & verbalized understandning.  Will continue to monitor.  Karie Kirks, Therapist, sports.

## 2014-04-21 NOTE — Progress Notes (Signed)
ANTICOAGULATION CONSULT NOTE - Follow Up Consult  Pharmacy Consult for heparin and Coumadin Indication: atrial fibrillation and Factor V Leiden deficiency   No Known Allergies  Patient Measurements: Height: 5\' 11"  (180.3 cm) Weight: 204 lb 4.8 oz (92.67 kg) (SCALE a) IBW/kg (Calculated) : 75.3  Vital Signs: Temp: 98.7 F (37.1 C) (04/29 0900) Temp src: Oral (04/29 0900) BP: 125/96 mmHg (04/29 0900) Pulse Rate: 63 (04/29 0900)  Labs:  Recent Labs  04/20/14 1433 04/21/14 0005 04/21/14 0100 04/21/14 1000  HGB 16.4 15.7  --   --   HCT 49.8 49.2  --   --   PLT 124* 119*  --   --   LABPROT 18.0* 18.1*  --   --   INR 1.53* 1.54*  --   --   HEPARINUNFRC  --   --  0.30 0.61  CREATININE 1.14 1.42*  --   --   TROPONINI 0.46*  --   --   --     Estimated Creatinine Clearance: 60.4 ml/min (by C-G formula based on Cr of 1.42).   Medications:  Infusions:  . heparin 1,300 Units/hr (04/21/14 0450)    Assessment: 69 y/o male on heparin for Afib and FVL deficiency. Pharmacy consulted to to resume Coumadin also. Heparin level is therapeutic at 0.61. INR is subtherapeutic at 1.54, will give extra today. No bleeding noted, Hb is normal, platelets low at 119.  Patient stated that his home Coumadin regimen is 5mg  daily except 7.5 mg on Mondays and Fridays. However, the anti-coag clinic note from 4/21 had his regimen as 7.5mg  daily except 5mg  on Mondays and Fridays.  Goal of Therapy:  INR 2-3 Heparin level 0.3-0.7 units/ml Monitor platelets by anticoagulation protocol: Yes   Plan:  - Continue heparin drip at 1300 units/hr - Coumadin 10 mg PO x1 - Daily heparin level, CBC and INR - Monitor for s/sx of bleeding  [facility], Pharm.D., BCPS Clinical Pharmacist Pager: [phone] 04/21/2014 11:19 AM

## 2014-04-21 NOTE — Progress Notes (Signed)
UR Completed.  Sabastien Tyler Jane Merrie Epler [phone] 04/21/2014  

## 2014-04-21 NOTE — Progress Notes (Signed)
Lexiscan myoview completed without complications.  + SOB, Headache and mild chest pain.  Resolved in recovery.  Nuc results to follow.

## 2014-04-21 NOTE — Progress Notes (Signed)
Pt back from Myo view test.  Tolerated test well.   Will continue to monitor.  Karie Kirks, Therapist, sports.

## 2014-04-21 NOTE — Progress Notes (Signed)
  Echocardiogram 2D Echocardiogram has been performed.  Louis Ford 04/21/2014, 3:32 PM

## 2014-04-22 DIAGNOSIS — I4891 Unspecified atrial fibrillation: Secondary | ICD-10-CM

## 2014-04-22 LAB — CBC
HEMATOCRIT: 46.3 % (ref 39.0–52.0)
Hemoglobin: 15 g/dL (ref 13.0–17.0)
MCH: 28.7 pg (ref 26.0–34.0)
MCHC: 32.4 g/dL (ref 30.0–36.0)
MCV: 88.7 fL (ref 78.0–100.0)
Platelets: 101 10*3/uL — ABNORMAL LOW (ref 150–400)
RBC: 5.22 MIL/uL (ref 4.22–5.81)
RDW: 15 % (ref 11.5–15.5)
WBC: 4 10*3/uL (ref 4.0–10.5)

## 2014-04-22 LAB — HEPARIN LEVEL (UNFRACTIONATED): HEPARIN UNFRACTIONATED: 0.73 [IU]/mL — AB (ref 0.30–0.70)

## 2014-04-22 LAB — PROTIME-INR
INR: 2.23 — ABNORMAL HIGH (ref 0.00–1.49)
Prothrombin Time: 24 seconds — ABNORMAL HIGH (ref 11.6–15.2)

## 2014-04-22 MED ORDER — WARFARIN SODIUM 1 MG PO TABS
1.0000 mg | ORAL_TABLET | Freq: Once | ORAL | Status: AC
  Administered 2014-04-22: 1 mg via ORAL
  Filled 2014-04-22: qty 1

## 2014-04-22 MED ORDER — CARVEDILOL 25 MG PO TABS: 25.0000 mg | ORAL_TABLET | Freq: Two times a day (BID) | ORAL | Status: DC

## 2014-04-22 MED ORDER — LISINOPRIL 5 MG PO TABS: 5.0000 mg | ORAL_TABLET | Freq: Every day | ORAL | Status: DC

## 2014-04-22 NOTE — Evaluation (Signed)
Physical Therapy Evaluation Patient Details Name: Louis Ford MRN: [id number] DOB: 08-12-1946 Today's Date: 04/22/2014   History of Present Illness  Pt adm with a-fib. past medical history significant for ischemic cardiomyopathy, chronic systolic heart failure and atrial fibrillation.  He had a [employer] single chamber ICD implanted in 2006 with most recent gen change 03/2011.    Clinical Impression  Pt seen for evaluation regarding D/C disposition and HH needs. At this time pt mobilizing at mod i. Pt challenged with high level balance activities and demo good stability and no balance disturbances noted. Pt scored 21 on DGI; indicating pt is at a low risk for falls. No acute or follow up PT warranted at this time.   Follow Up Recommendations No PT follow up;Supervision - Intermittent    Equipment Recommendations  None recommended by PT    Recommendations for Other Services       Precautions / Restrictions Precautions Precautions: None Restrictions Weight Bearing Restrictions: No      Mobility  Bed Mobility Overal bed mobility: Independent                Transfers Overall transfer level: Modified independent Equipment used: None             General transfer comment: incr time due to back pain; no LOB or (A) needed  Ambulation/Gait Ambulation/Gait assistance: Modified independent (Device/Increase time) Ambulation Distance (Feet): 200 Feet Assistive device: None Gait Pattern/deviations: Wide base of support;Decreased stride length Gait velocity: decreased - pt reports baseline  Gait velocity interpretation: Below normal speed for age/gender General Gait Details: challenged with high level balance (see DGI); no LOB or sway noted; pt reports at his baseline; fwd flexed posture due to back pain   Stairs Stairs: Yes Stairs assistance: Modified independent (Device/Increase time) Stair Management: Two rails;Step to pattern;Forwards Number of Stairs: 4 General stair  comments: demo good balance and safety awareness   Wheelchair Mobility    Modified Rankin (Stroke Patients Only)       Balance Overall balance assessment:  (see DGI )                               Standardized Balance Assessment Standardized Balance Assessment : Dynamic Gait Index   Dynamic Gait Index Level Surface: Normal Change in Gait Speed: Normal Gait with Horizontal Head Turns: Normal Gait with Vertical Head Turns: Normal Gait and Pivot Turn: Normal Step Over Obstacle: Mild Impairment Step Around Obstacles: Normal Steps: Moderate Impairment Total Score: 21       Pertinent Vitals/Pain C/o chronic LBP; did not rate     Home Living Family/patient expects to be discharged to:: Private residence Living Arrangements: Children Available Help at Discharge: [place];Family Type of Home: House Home Access: Stairs to enter Entrance Stairs-Rails: Can reach both;Left;Right Entrance Stairs-Number of Steps: 6 Home Layout: Multi-level;Able to live on main level with bedroom/bathroom        Prior Function Level of Independence: Independent               Hand Dominance        Extremity/Trunk Assessment   Upper Extremity Assessment: Overall WFL for tasks assessed           Lower Extremity Assessment: Overall WFL for tasks assessed      Cervical / Trunk Assessment: Other exceptions  Communication   Communication: No difficulties  Cognition Arousal/Alertness: Awake/alert Behavior During Therapy: WFL for tasks assessed/performed  Overall Cognitive Status: Within Functional Limits for tasks assessed                      General Comments General comments (skin integrity, edema, etc.): pt concerned regarding transportation home; CSW aware     Exercises        Assessment/Plan    PT Assessment Patent does not need any further PT services  PT Diagnosis     PT Problem List    PT Treatment Interventions     PT  Goals (Current goals can be found in the Care Plan section) Acute Rehab PT Goals PT Goal Formulation: No goals set, d/c therapy    Frequency     Barriers to discharge        Co-evaluation               End of Session Equipment Utilized During Treatment: Gait belt Activity Tolerance: Patient tolerated treatment well Patient left: in bed;with call bell/phone within reach Nurse Communication: Mobility status         Time: 1550-1603 PT Time Calculation (min): 13 min   Charges:   PT Evaluation $Initial PT Evaluation Tier I: 1 Procedure PT Treatments $Gait Training: 8-22 mins   PT G CodesKennis Carina [place], [place]  [phone] 04/22/2014, 5:04 PM

## 2014-04-22 NOTE — Discharge Summary (Signed)
ELECTROPHYSIOLOGY DISCHARGE SUMMARY   Patient ID: Louis Ford,  MRN: [id number], DOB/AGE: Sep 21, 1946 68 y.o.  Admit date: 04/20/2014 Discharge date: 04/22/2014  Primary Care Physician: Antonietta Jewel, MD Primary Cardiologist: Haroldine Laws, MD in CHF clinic  Primary EP: [name], MD  Primary Discharge Diagnosis:  Inappropriate ICD shocks Atrial fibrillation with RVR NSTEMI  Secondary Discharge Diagnoses:  Ischemic CM s/p ICD implant Chronic systolic HF Hypertension Dyslipidemia Prior CVA Seizure disorder Factor VII deficiency  Procedures This Admission:  1. 2D echo cardiogram 04/21/2014  Study Conclusions Left ventricle: The cavity size was normal. There was mild concentric hypertrophy. Systolic function was moderately to severely reduced. The estimated ejection fraction was in the range of 30% to 35%. Diffuse hypokinesis. Impression: - Compared with previous TTE and TEE from 2013, LVEF has improved. The small mobile aortic valve echodensity was present on those images, but appears slightly larger on the current study. It is therefore highly unlikely to be a vegetation. A slowly growing fibroelastoma cannot be fully excluded. 2. Lexiscan Myoview 04/21/2014 FINDINGS: Spect: Scar at the apex and septal apex is noted. There is a perfusion defects involving the inferolateral wall on stress imaging with sparing of the lateral apex at re- perfuses on rest imaging. Gated images demonstrate diminished motion and thickening in this region. Although, mid wall and basilar ischemia would be unusual, a cannot be excluded. Wall motion: Inferolateral hypokinesis. Ejection fraction: 36%. End-diastolic volume 403 cc. End systolic volume 86 cc. IMPRESSION: Inferolateral wall ischemia cannot be excluded. See above discussion. Apical scar. ** reviewed with / by Dr. Sallyanne Kuster and deemed low risk study **  History:  Louis Ford is a 68 year old man with an ischemic cardiomyopathy,  chronic systolic heart failure and atrial fibrillation. He has a Biotronik single chamber ICD implanted in 2006 with most recent gen change April 2012. He reports that he has had a hard time affording his medications and a few weeks ago was without his metoprolol, lisinopril, and lasix for several days. He has been back on his medications but missed his doses on the day of admission. On 04/20/2014 he received 9 ICD shocks and presented to the ED for further evaluation. Device interrogation demonstrated inappropriate therapy for AFib with RVR. Device function is otherwise normal. He denies recent chest pain, SOB, palpitations or syncope. He reports being under increased emotional stress lately because of his living situation - he is working on finding a different place to live that would be less stressful. Lab work is notable for subtherapeutic INR, AST 41, Alk Phos 128, WBC 3.4, platelets 124, trop 0.46. Last echo 09/2012 demonstrated EF 20-25%, mild to moderate MR.   Hospital Course: [name] was admitted to telemetry. He was evaluated by Dr. Rayann Heman. His ICD shocks were inappropriate, secondary to AFib with RVR. He had very rapidly conducting AFib, likely due to medicine noncompliance. Given noncompliance, he is a poor candidate for AAD therapy. Metoprolol was discontinued in favor of Coreg. His chads2vasc score is at least 6. He also has factor VII deficiency; therefore he was continued on Coumadin with heparin bridging while INR subtherapeutic. His ICD was reprogrammed to prevent inappropriate ICD shocks. He remained euvolemic and his CHF medical therapy was continued. He ruled in for NSTEMI with troponin of 1.81. This was felt to represent direct muscle injury from multiple ICD shocks and possibly from demand ischemia related to very rapid AFib with underlying CAD. He underwent Lexiscan Myoview. Results were reviewed / discussed with Dr. Sallyanne Kuster and  it was deemed a low risk study. He remained  hemodynamically stable without any recurrent ventricular arrhythmias. On 04/22/2014, he was seen, examined and deemed stable for discharge home. He will follow-up with Dr. Lovena Le as scheduled in 4-6 weeks. Of note, he will also re-establish care in CHF clinic in the next 2-3 weeks. ADDENDUM: An appointment was scheduled for CHF clinic PA/NP on Monday, May 11 at 9:00 AM.  Discharge Vitals: Blood pressure 101/63, pulse 58, temperature 97.9 F (36.6 C), temperature source Oral, resp. rate 18, height 5' 11"  (1.803 m), weight 202 lb 14.4 oz (92.035 kg), SpO2 100.00%.   Labs: Lab Results  Component Value Date   WBC 4.0 04/22/2014   HGB 15.0 04/22/2014   HCT 46.3 04/22/2014   MCV 88.7 04/22/2014   PLT 101* 04/22/2014    Recent Labs Lab 04/20/14 1433 04/21/14 0005  NA 138 142  K 4.5 4.5  CL 102 103  CO2 21 26  BUN 20 20  CREATININE 1.14 1.42*  CALCIUM 9.3 9.2  PROT 8.7*  --   BILITOT 0.5  --   ALKPHOS 128*  --   ALT 36  --   AST 41*  --   GLUCOSE 78 79   Lab Results  Component Value Date   CKTOTAL 25 10/09/2012   CKMB 6.3* 09/22/2012   TROPONINI 0.46* 04/20/2014     Recent Labs  04/22/14 0617  INR 2.23*    Disposition:  The patient is being discharged in stable condition.  Follow-up: Follow-up Information   Follow up with [facility] On 04/28/2014. (At 2:15 PM for Coumadin follow-up)    Specialty:  Cardiology   Contact information:   [address], [place] [address] [phone]      Follow up with [name], MD On 06/10/2014. (At 2:15 PM)    Specialty:  Cardiology   Contact information:   [address] [place] [address] [phone]      Discharge Medications:    Medication List    STOP taking these medications       metoprolol tartrate 25 MG tablet  Commonly known as:  LOPRESSOR      TAKE these medications       carvedilol 25 MG tablet  Commonly known as:  COREG  Take 1 tablet (25 mg total)  by mouth 2 (two) times daily with a meal.     furosemide 40 MG tablet  Commonly known as:  LASIX  Take 1 tablet (40 mg total) by mouth daily.     lisinopril 5 MG tablet  Commonly known as:  PRINIVIL,ZESTRIL  Take 1 tablet (5 mg total) by mouth daily.     phenytoin 100 MG ER capsule  Commonly known as:  DILANTIN  Take 100 mg by mouth daily.     warfarin 5 MG tablet  Commonly known as:  COUMADIN  Take 5-7.5 mg by mouth daily. 62m daily except 7.5 on Mondays and Fridays       Duration of Discharge Encounter: Greater than 30 minutes including physician time.  Signed, BAzzie RoupEdmisten, PA-C 04/22/2014, 1:17 PM  EP Attending  Patient seen and examined. Agree with above. [place].   GMikle BosworthD.

## 2014-04-22 NOTE — Progress Notes (Signed)
CSW received referral to assist this 68 year old male with transportation. CSW met with patient who stated that he did not have anyone to pick him up. CSW contacted his son Damon who confirmed that he did not have a car and he attempted to reach someone to come pick up his father without success.  CSW attempted to reach other family members listed without success.  Patient was sent to the d/c lounge and Taxi voucher was provided to patient for transport via [employer].  Nursing notified. No further CSW needs identified. CSW signing off.  Lorie Phenix. [place], [place]

## 2014-04-22 NOTE — Care Management Note (Addendum)
  Page 2 of 2   04/22/2014     1:32:17 PM CARE MANAGEMENT NOTE 04/22/2014  Patient:  Louis Ford, Louis Ford   Account Number:  [id number]  Date Initiated:  04/22/2014  Documentation initiated by:  Meiko Stranahan  Subjective/Objective Assessment:   Admitted withA-Fif.     Action/Plan:   CM to follow for dispositon needs   Anticipated DC Date:  04/22/2014   Anticipated DC Plan:  HOME/SELF CARE  In-house referral  Clinical Social Worker      DC Planning Services  CM consult  Other      Choice offered to / List presented to:             Status of service:  Completed, signed off Medicare Important Message given?  NO (If response is "NO", the following Medicare IM given date fields will be blank) Date Medicare IM given:   Date Additional Medicare IM given:    Discharge Disposition:  HOME/SELF CARE  Per UR Regulation:  Reviewed for med. necessity/level of care/duration of stay  If discussed at [place] of Stay Meetings, dates discussed:    Comments:  Shiann Kam RN, BSN, MSHL, CCM  Nurse - Case Manager, ([facility] [place])  [phone]  04/21/2014 CM Consult:  Nurse reports patient stating has nowhere to go at discharge. CM met with patient to discuss d/c plan.  Patient was on phone with DTR/Louis Ford and patient asked CM to talk with DTR. Patient gave Verbal Consent to discuss care with PCG/DTR/Louis Ford. Louis Ford confirmes patient lives with her and will d/c back to her home [place], [place] [address] [phone] IM MCR:  not administered on admission. PCP:  Dr. Lovena Le Medications:  Self managed. Transportation:  [employer] states patient will need transportation home as she does not have a car. SW referral for transportation needs at d/c. ADD:  today

## 2014-04-22 NOTE — Progress Notes (Signed)
SUBJECTIVE: The patient is complaining of persistent headache and pain around ICD site (slightly improved) today.  No shortness of breath or other chest pain.  He remains quite upset about his housing situation.    Labs pending - myoview reviewed - discussed results with Dr Recardo Evangelist, felt to be low risk study.   CURRENT MEDICATIONS: . carvedilol  25 mg Oral BID WC  . furosemide  40 mg Oral Daily  . lisinopril  5 mg Oral Daily  . phenytoin  100 mg Oral Daily  . sodium chloride  3 mL Intravenous Q12H  . Warfarin - Physician Dosing Inpatient   Does not apply q1800   . heparin 1,300 Units/hr (04/21/14 1402)    OBJECTIVE: Physical Exam: Filed Vitals:   04/21/14 1800 04/21/14 2031 04/22/14 0117 04/22/14 0553  BP: 100/80 99/65 91/65  103/68  Pulse: 66 66 63 56  Temp: 97.4 F (36.3 C) 98.3 F (36.8 C) 97.8 F (36.6 C) 97.9 F (36.6 C)  TempSrc: Oral Oral Oral Oral  Resp: 20 18 18 18   Height:      Weight:    202 lb 14.4 oz (92.035 kg)  SpO2: 100% 100% 100% 100%    Intake/Output Summary (Last 24 hours) at 04/22/14 0630 Last data filed at 04/22/14 0554  Gross per 24 hour  Intake    480 ml  Output   1000 ml  Net   -520 ml    Telemetry reveals sinus rhythm with PAC's, PVC's, run of afib, occ V pacing  GEN- The patient is well appearing, alert and oriented x 3 today.   Head- normocephalic, atraumatic Eyes-  Sclera clear, conjunctiva pink Ears- hearing intact Oropharynx- clear Neck- supple, no JVP Lymph- no cervical lymphadenopathy Lungs- Clear to ausculation bilaterally, normal work of breathing Heart- irregular rate and rhythm, 2/6 SEM LLSB GI- soft, NT, ND, + BS Extremities- no clubbing, cyanosis, or edema Neuro- strength/ sensation are intact  LABS: Basic Metabolic Panel:  Recent Labs  04/20/14 1433 04/21/14 0005  NA 138 142  K 4.5 4.5  CL 102 103  CO2 21 26  GLUCOSE 78 79  BUN 20 20  CREATININE 1.14 1.42*  CALCIUM 9.3 9.2   Liver Function  Tests:  Recent Labs  04/20/14 1433  AST 41*  ALT 36  ALKPHOS 128*  BILITOT 0.5  PROT 8.7*  ALBUMIN 4.2   CBC:  Recent Labs  04/20/14 1433 04/21/14 0005  WBC 3.4* 4.4  NEUTROABS 2.1  --   HGB 16.4 15.7  HCT 49.8 49.2  MCV 88.1 89.6  PLT 124* 119*   Cardiac Enzymes:  Recent Labs  04/20/14 1433  TROPONINI 0.46*   Thyroid Function Tests:  Recent Labs  04/20/14 1835  TSH 1.900    RADIOLOGY: Dg Chest Portable 1 View 04/20/2014   CLINICAL DATA:  Atrial fibrillation  EXAM: PORTABLE CHEST - 1 VIEW  COMPARISON:  Prior chest x-ray 09/28/2012  FINDINGS: Stable cardiac and mediastinal contours. Cardiomegaly. Aortic atherosclerosis. Patient is status post median sternotomy with multivessel CABG. Right subclavian approach single lead cardiac rhythm maintenance device. A lead projects over the right ventricle. No focal airspace consolidation, pleural effusion, pulmonary edema or pneumothorax.  IMPRESSION: Stable chest x-ray, no acute cardiopulmonary process.   Electronically Signed   By: Jacqulynn Cadet M.D.   On: 04/20/2014 15:17    ASSESSMENT AND PLAN:  Active Problems:   Atrial fibrillation   Afib  1. Afib, now back to sinus rhythm Coumadin Increase coreg for  better rate control Echo to evaluate for structural changes  2. NSTEMI I suspect that this is direct muscle injury from ICD shocks.  Also demand ischemia from afib with RVR.  Leane Call is low risk, he will be stable for discharge  3. htn Above goal Increase coreg  Will ask social work to assist with living situation and help was provided transportation.  Louis Ford, M.D.

## 2014-04-22 NOTE — Progress Notes (Signed)
ANTICOAGULATION CONSULT NOTE - Follow Up Consult  Pharmacy Consult for heparin and Coumadin Indication: atrial fibrillation and Factor V Leiden deficiency   No Known Allergies  Patient Measurements: Height: 5\' 11"  (180.3 cm) Weight: 202 lb 14.4 oz (92.035 kg) (scale A) IBW/kg (Calculated) : 75.3  Vital Signs: Temp: 97.9 F (36.6 C) (04/30 0553) Temp src: Oral (04/30 0553) BP: 101/63 mmHg (04/30 1008) Pulse Rate: 58 (04/30 1008)  Labs:  Recent Labs  04/20/14 1433 04/21/14 0005 04/21/14 0100 04/21/14 1000 04/22/14 0617  HGB 16.4 15.7  --   --  15.0  HCT 49.8 49.2  --   --  46.3  PLT 124* 119*  --   --  101*  LABPROT 18.0* 18.1*  --   --  24.0*  INR 1.53* 1.54*  --   --  2.23*  HEPARINUNFRC  --   --  0.30 0.61 0.73*  CREATININE 1.14 1.42*  --   --   --   TROPONINI 0.46*  --   --   --   --     Estimated Creatinine Clearance: 60.2 ml/min (by C-G formula based on Cr of 1.42).   Medications:  Infusions:  . heparin 1,200 Units/hr (04/22/14 1007)    Assessment: 68 y/o male on heparin to Coumadin bridge for Afib and FVL deficiency. Heparin level is slightly supratherapeutic at 0.73. INR jumped from 1.54 yesterday to 2.23 today. No bleeding noted, Hb is normal, platelets low and trending down a bit.  Patient stated that his home Coumadin regimen is 5mg  daily except 7.5 mg on Mondays and Fridays. However, the anti-coag clinic note from 4/21 had his regimen as 7.5mg  daily except 5mg  on Mondays and Fridays.  Goal of Therapy:  INR 2-3 Heparin level 0.3-0.7 units/ml Monitor platelets by anticoagulation protocol: Yes   Plan:  - Decrease heparin drip to 1200 units/hr - would recommend continuing as INR jumped but may not be fully anticoagulated yet - Coumadin 1 mg PO x1 - Daily heparin level, CBC and INR - Monitor for s/sx of bleeding  [facility], [place].D., BCPS Clinical Pharmacist Pager: [phone] 04/22/2014 10:47 AM

## 2014-04-23 NOTE — Telephone Encounter (Signed)
New medssage  Melissa the case managing nurse with [employer] called. She states the pt was discharged from the hospital as of 04/22/2014. He was prescribed Coreg, however he is unable to fill this Rx due to insurance and copayment issues. Being that our office does not provide samples of Coreg, is ok for the pt to continue the use of Lopressor until he is able to fill the new medication. Please advise.

## 2014-04-23 NOTE — Telephone Encounter (Signed)
Discussed with Dr Lovena Le and called the [facility]  He is going to continue the Lopressor until [facility] nd then will get the Carvedilol filled

## 2014-05-03 ENCOUNTER — Inpatient Hospital Stay (HOSPITAL_COMMUNITY): Admission: RE | Admit: 2014-05-03 | Payer: Medicare Other | Source: Ambulatory Visit

## 2014-05-03 ENCOUNTER — Telehealth: Payer: Self-pay | Admitting: Licensed Clinical Social Worker

## 2014-05-03 ENCOUNTER — Encounter (HOSPITAL_COMMUNITY): Payer: Self-pay

## 2014-05-03 NOTE — Telephone Encounter (Signed)
CSW received referral to asisst patient with community resources post hospitalization although patient did not show for clinic appointment today. CSW attempted to contact via phone although no answer and unable to leave message for return call. Raquel Sarna, [place]

## 2014-05-10 ENCOUNTER — Telehealth: Payer: Self-pay | Admitting: Licensed Clinical Social Worker

## 2014-05-10 ENCOUNTER — Other Ambulatory Visit: Payer: Self-pay | Admitting: *Deleted

## 2014-05-10 MED ORDER — WARFARIN SODIUM 5 MG PO TABS
ORAL_TABLET | ORAL | Status: DC
Start: 2014-05-10 — End: 2014-06-10

## 2014-05-10 NOTE — Telephone Encounter (Signed)
CSW attempted to contact patient again with no success. Unable to leave message. CSW available when patient contact is made. Raquel Sarna, [place]

## 2014-05-11 ENCOUNTER — Encounter: Payer: Self-pay | Admitting: Internal Medicine

## 2014-06-10 ENCOUNTER — Encounter: Payer: Medicare Other | Admitting: Internal Medicine

## 2014-06-10 ENCOUNTER — Ambulatory Visit (INDEPENDENT_AMBULATORY_CARE_PROVIDER_SITE_OTHER): Payer: Medicare Other

## 2014-06-10 DIAGNOSIS — I4891 Unspecified atrial fibrillation: Secondary | ICD-10-CM

## 2014-06-10 DIAGNOSIS — Z7901 Long term (current) use of anticoagulants: Secondary | ICD-10-CM

## 2014-06-10 DIAGNOSIS — Z5181 Encounter for therapeutic drug level monitoring: Secondary | ICD-10-CM

## 2014-06-10 LAB — POCT INR: INR: 1.2

## 2014-06-29 ENCOUNTER — Ambulatory Visit (INDEPENDENT_AMBULATORY_CARE_PROVIDER_SITE_OTHER): Payer: Medicare Other | Admitting: *Deleted

## 2014-06-29 DIAGNOSIS — I4891 Unspecified atrial fibrillation: Secondary | ICD-10-CM

## 2014-06-29 DIAGNOSIS — Z7901 Long term (current) use of anticoagulants: Secondary | ICD-10-CM

## 2014-06-29 DIAGNOSIS — Z5181 Encounter for therapeutic drug level monitoring: Secondary | ICD-10-CM

## 2014-06-29 LAB — POCT INR: INR: 2.2

## 2014-06-30 ENCOUNTER — Telehealth: Payer: Self-pay | Admitting: *Deleted

## 2014-06-30 NOTE — Telephone Encounter (Signed)
Spoke with pt and stressed that he should be taking Coreg and he states understanding

## 2014-07-06 ENCOUNTER — Encounter: Payer: Self-pay | Admitting: Internal Medicine

## 2014-07-06 ENCOUNTER — Ambulatory Visit (INDEPENDENT_AMBULATORY_CARE_PROVIDER_SITE_OTHER): Payer: Medicare Other | Admitting: Internal Medicine

## 2014-07-06 ENCOUNTER — Ambulatory Visit (INDEPENDENT_AMBULATORY_CARE_PROVIDER_SITE_OTHER): Payer: Medicare Other | Admitting: *Deleted

## 2014-07-06 VITALS — BP 143/94 | HR 83 | Ht 71.0 in | Wt 206.0 lb

## 2014-07-06 DIAGNOSIS — I25119 Atherosclerotic heart disease of native coronary artery with unspecified angina pectoris: Secondary | ICD-10-CM

## 2014-07-06 DIAGNOSIS — I5023 Acute on chronic systolic (congestive) heart failure: Secondary | ICD-10-CM

## 2014-07-06 DIAGNOSIS — I209 Angina pectoris, unspecified: Secondary | ICD-10-CM

## 2014-07-06 DIAGNOSIS — Z5181 Encounter for therapeutic drug level monitoring: Secondary | ICD-10-CM

## 2014-07-06 DIAGNOSIS — Z95810 Presence of automatic (implantable) cardiac defibrillator: Secondary | ICD-10-CM

## 2014-07-06 DIAGNOSIS — I4891 Unspecified atrial fibrillation: Secondary | ICD-10-CM

## 2014-07-06 DIAGNOSIS — I255 Ischemic cardiomyopathy: Secondary | ICD-10-CM

## 2014-07-06 DIAGNOSIS — Z7901 Long term (current) use of anticoagulants: Secondary | ICD-10-CM

## 2014-07-06 DIAGNOSIS — I5022 Chronic systolic (congestive) heart failure: Secondary | ICD-10-CM

## 2014-07-06 DIAGNOSIS — I2589 Other forms of chronic ischemic heart disease: Secondary | ICD-10-CM

## 2014-07-06 DIAGNOSIS — I2510 Atherosclerotic heart disease of native coronary artery without angina pectoris: Secondary | ICD-10-CM

## 2014-07-06 LAB — MDC_IDC_ENUM_SESS_TYPE_INCLINIC
Brady Statistic RV Percent Paced: 0 %
Date Time Interrogation Session: 20150714152500
HIGH POWER IMPEDANCE MEASURED VALUE: 65 Ohm
Implantable Pulse Generator Model: 540
Lead Channel Impedance Value: 509 Ohm
Lead Channel Sensing Intrinsic Amplitude: 13 mV
MDC IDC MSMT BATTERY VOLTAGE: 3.11 V
MDC IDC MSMT LEADCHNL RV PACING THRESHOLD AMPLITUDE: 0.5 V
MDC IDC MSMT LEADCHNL RV PACING THRESHOLD PULSEWIDTH: 0.4 ms
MDC IDC PG SERIAL: 60613417
MDC IDC SET LEADCHNL RV PACING AMPLITUDE: 2 V
MDC IDC SET LEADCHNL RV PACING PULSEWIDTH: 0.4 ms
MDC IDC SET LEADCHNL RV SENSING SENSITIVITY: 0.8 mV
Zone Setting Detection Interval: 260 ms
Zone Setting Detection Interval: 400 ms

## 2014-07-06 LAB — POCT INR: INR: 3.6

## 2014-07-06 NOTE — Assessment & Plan Note (Signed)
His symptoms are class 2. I have asked him to restart his coreg as soon as possible. We have no samples today.

## 2014-07-06 NOTE — Assessment & Plan Note (Signed)
His ventricular rate is controlled. He will continue his current meds.  

## 2014-07-06 NOTE — Patient Instructions (Signed)
Your physician wants you to follow-up in: 12 months with Dr Knox Saliva will receive a reminder letter in the mail two months in advance. If you don't receive a letter, please call our office to schedule the follow-up appointment.  Remote monitoring is used to monitor your Pacemaker or ICD from home. This monitoring reduces the number of office visits required to check your device to one time per year. It allows [place] to keep an eye on the functioning of your device to ensure it is working properly. You are scheduled for a device check from home on 10/05/14. You may send your transmission at any time that day. If you have a wireless device, the transmission will be sent automatically. After your physician reviews your transmission, you will receive a postcard with your next transmission date.

## 2014-07-06 NOTE — Progress Notes (Signed)
HPI Louis Ford returns today for ICD followup. He is a pleasant 68 yo man, recently moved from [place] who has a h/o HTN, PAF and an ICM with chronic class 2 CHF. He has been out of his coreg, sighting financial difficulties. He hsa had no ICD shock. No chest pain. No Known Allergies   Current Outpatient Prescriptions  Medication Sig Dispense Refill  . furosemide (LASIX) 40 MG tablet Take 1 tablet (40 mg total) by mouth daily.  30 tablet  6  . lisinopril (PRINIVIL,ZESTRIL) 5 MG tablet Take 1 tablet (5 mg total) by mouth daily.  30 tablet  4  . phenytoin (DILANTIN) 100 MG ER capsule Take 100 mg by mouth daily.      [name] warfarin (COUMADIN) 5 MG tablet Take 1 and 1/2 tablets everyday EXCEPT take only 1 tablet on Mondays and Fridays or as directed by coumadin clinic       No current facility-administered medications for this visit.     Past Medical History  Diagnosis Date  . CAD (coronary artery disease)   . Chronic systolic heart failure   . Ischemic cardiomyopathy     EF 15 to 20% by TTE and TEE in 09/2012.  Severe LV dysfunction  . HTN (hypertension)   . Hyperlipidemia   . Obesity     BMI 31 in 09/2012  . Atrial fibrillation   09/22/2012  . implantable cardiac defibrillator-Biotronik     Device Implanted 2006; s/p gen change 03/2011 : bleeding persistent with pocket erosion and infection; explant and reimplant  06/2011  . Stroke   . 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 History of MRSA infection 05/2011  . Seizure disorder latest 09/30/2012  . 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 Seizures     ROS:   All systems reviewed and negative except as noted in the HPI.   Past Surgical History  Procedure Laterality Date  . Coronary artery bypass graft    . Tee without cardioversion  09/24/2012    Procedure: TRANSESOPHAGEAL ECHOCARDIOGRAM (TEE);  Surgeon: [name], MD;  Location: [facility];  Service: Cardiovascular;  Laterality: N/A;  dave/anesth, dl, cindy/echo   .  Cardioversion  09/24/2012    Procedure: CARDIOVERSION;  Surgeon: [name], MD;  Location: [facility] ENDOSCOPY;  Service: Cardiovascular;  Laterality: N/A;  . Icd       No family history on file.   History   Social History  . Marital Status: Single    Spouse Name: N/A    Number of Children: N/A  . Years of Education: N/A   Occupational History  . Not on file.   Social History Main Topics  . Smoking status: Former Smoker    Types: Pipe    Quit date: 09/21/2008  . Smokeless tobacco: Former Systems developer    Quit date: 09/21/2008  . Alcohol Use: No  . Drug Use: No  . Sexual Activity: No   Other Topics Concern  . Not on file   Social History Narrative  . No narrative on file     BP 143/94  Pulse 83  Ht 5\' 11"  (1.803 m)  Wt 206 lb (93.441 kg)  BMI 28.74 kg/m2  Physical Exam:  Stable appearing NAD HEENT: Unremarkable Neck:  No JVD, no thyromegally Back:  No CVA tenderness Lungs:  Clear with no wheezes HEART:  Regular rate rhythm, no murmurs, no rubs, no clicks Abd:  soft, positive bowel sounds, no organomegally, no rebound, no  guarding Ext:  2 plus pulses, no edema, no cyanosis, no clubbing Skin:  No rashes no nodules Neuro:  CN II through XII intact, motor grossly intact   DEVICE  Normal device function.  See PaceArt for details.   Assess/Plan:

## 2014-07-06 NOTE — Assessment & Plan Note (Signed)
He denies anginal symptoms. Will follow. 

## 2014-07-16 ENCOUNTER — Ambulatory Visit (INDEPENDENT_AMBULATORY_CARE_PROVIDER_SITE_OTHER): Payer: Medicare Other

## 2014-07-16 DIAGNOSIS — Z5181 Encounter for therapeutic drug level monitoring: Secondary | ICD-10-CM

## 2014-07-16 DIAGNOSIS — I4891 Unspecified atrial fibrillation: Secondary | ICD-10-CM

## 2014-07-16 DIAGNOSIS — Z7901 Long term (current) use of anticoagulants: Secondary | ICD-10-CM

## 2014-07-16 LAB — POCT INR: INR: 3.4

## 2014-07-29 ENCOUNTER — Ambulatory Visit (INDEPENDENT_AMBULATORY_CARE_PROVIDER_SITE_OTHER): Payer: Medicare Other

## 2014-07-29 DIAGNOSIS — I4891 Unspecified atrial fibrillation: Secondary | ICD-10-CM

## 2014-07-29 DIAGNOSIS — Z7901 Long term (current) use of anticoagulants: Secondary | ICD-10-CM

## 2014-07-29 DIAGNOSIS — Z5181 Encounter for therapeutic drug level monitoring: Secondary | ICD-10-CM

## 2014-07-29 LAB — POCT INR: INR: 2.4

## 2014-07-29 MED ORDER — CARVEDILOL 25 MG PO TABS: 25.0000 mg | ORAL_TABLET | Freq: Two times a day (BID) | ORAL | Status: DC

## 2014-08-12 ENCOUNTER — Ambulatory Visit (INDEPENDENT_AMBULATORY_CARE_PROVIDER_SITE_OTHER): Payer: Medicare Other | Admitting: *Deleted

## 2014-08-12 DIAGNOSIS — Z5181 Encounter for therapeutic drug level monitoring: Secondary | ICD-10-CM

## 2014-08-12 DIAGNOSIS — Z7901 Long term (current) use of anticoagulants: Secondary | ICD-10-CM

## 2014-08-12 DIAGNOSIS — I4891 Unspecified atrial fibrillation: Secondary | ICD-10-CM

## 2014-08-12 LAB — POCT INR: INR: 1.9

## 2014-08-12 NOTE — Patient Instructions (Signed)
[place] and they state have Lisinopril 5mg  at pharmacy since May and he has not picked up Pt instructed that on his medication list he is to take Lisinopril 5mg  one time a day and he states understanding and he states will pick up his medication today

## 2014-09-07 ENCOUNTER — Ambulatory Visit (INDEPENDENT_AMBULATORY_CARE_PROVIDER_SITE_OTHER): Payer: Medicare Other | Admitting: Pharmacist

## 2014-09-07 DIAGNOSIS — Z7901 Long term (current) use of anticoagulants: Secondary | ICD-10-CM

## 2014-09-07 DIAGNOSIS — I4891 Unspecified atrial fibrillation: Secondary | ICD-10-CM

## 2014-09-07 DIAGNOSIS — Z5181 Encounter for therapeutic drug level monitoring: Secondary | ICD-10-CM

## 2014-09-07 LAB — POCT INR: INR: 3.8

## 2014-09-22 ENCOUNTER — Encounter (HOSPITAL_COMMUNITY): Payer: Self-pay | Admitting: Emergency Medicine

## 2014-09-22 ENCOUNTER — Emergency Department (HOSPITAL_COMMUNITY)
Admission: EM | Admit: 2014-09-22 | Discharge: 2014-09-23 | Disposition: A | Discharge status: Home / Self Care | Payer: Medicare Other | Attending: Emergency Medicine | Admitting: Emergency Medicine

## 2014-09-22 DIAGNOSIS — G40909 Epilepsy, unspecified, not intractable, without status epilepticus: Secondary | ICD-10-CM | POA: Diagnosis not present

## 2014-09-22 DIAGNOSIS — Z7901 Long term (current) use of anticoagulants: Secondary | ICD-10-CM | POA: Insufficient documentation

## 2014-09-22 DIAGNOSIS — I4891 Unspecified atrial fibrillation: Secondary | ICD-10-CM | POA: Insufficient documentation

## 2014-09-22 DIAGNOSIS — E669 Obesity, unspecified: Secondary | ICD-10-CM | POA: Insufficient documentation

## 2014-09-22 DIAGNOSIS — I2510 Atherosclerotic heart disease of native coronary artery without angina pectoris: Secondary | ICD-10-CM | POA: Diagnosis not present

## 2014-09-22 DIAGNOSIS — Z951 Presence of aortocoronary bypass graft: Secondary | ICD-10-CM | POA: Diagnosis not present

## 2014-09-22 DIAGNOSIS — Z8673 Personal history of transient ischemic attack (TIA), and cerebral infarction without residual deficits: Secondary | ICD-10-CM | POA: Insufficient documentation

## 2014-09-22 DIAGNOSIS — Z79899 Other long term (current) drug therapy: Secondary | ICD-10-CM | POA: Diagnosis not present

## 2014-09-22 DIAGNOSIS — I5022 Chronic systolic (congestive) heart failure: Secondary | ICD-10-CM | POA: Diagnosis not present

## 2014-09-22 DIAGNOSIS — Z95810 Presence of automatic (implantable) cardiac defibrillator: Secondary | ICD-10-CM | POA: Diagnosis not present

## 2014-09-22 DIAGNOSIS — Z87891 Personal history of nicotine dependence: Secondary | ICD-10-CM | POA: Insufficient documentation

## 2014-09-22 DIAGNOSIS — I10 Essential (primary) hypertension: Secondary | ICD-10-CM | POA: Insufficient documentation

## 2014-09-22 DIAGNOSIS — Z8614 Personal history of Methicillin resistant Staphylococcus aureus infection: Secondary | ICD-10-CM | POA: Diagnosis not present

## 2014-09-22 LAB — COMPREHENSIVE METABOLIC PANEL
ALT: 17 U/L (ref 0–53)
AST: 31 U/L (ref 0–37)
Albumin: 4 g/dL (ref 3.5–5.2)
Alkaline Phosphatase: 81 U/L (ref 39–117)
Anion gap: 12 (ref 5–15)
BUN: 24 mg/dL — ABNORMAL HIGH (ref 6–23)
CO2: 23 mEq/L (ref 19–32)
Calcium: 9.2 mg/dL (ref 8.4–10.5)
Chloride: 99 mEq/L (ref 96–112)
Creatinine, Ser: 1.17 mg/dL (ref 0.50–1.35)
GFR calc Af Amer: 74 mL/min — ABNORMAL LOW (ref >90)
GFR calc non Af Amer: 64 mL/min — ABNORMAL LOW (ref >90)
GLUCOSE: 83 mg/dL (ref 70–99)
Potassium: 4.8 mEq/L (ref 3.7–5.3)
SODIUM: 134 meq/L — AB (ref 137–147)
TOTAL PROTEIN: 8 g/dL (ref 6.0–8.3)
Total Bilirubin: 0.8 mg/dL (ref 0.3–1.2)

## 2014-09-22 LAB — CBC WITH DIFFERENTIAL/PLATELET
BASOS ABS: 0 10*3/uL (ref 0.0–0.1)
Basophils Relative: 1 % (ref 0–1)
Eosinophils Absolute: 0.1 10*3/uL (ref 0.0–0.7)
Eosinophils Relative: 3 % (ref 0–5)
HCT: 50 % (ref 39.0–52.0)
Hemoglobin: 16.7 g/dL (ref 13.0–17.0)
LYMPHS ABS: 1 10*3/uL (ref 0.7–4.0)
LYMPHS PCT: 27 % (ref 12–46)
MCH: 30.6 pg (ref 26.0–34.0)
MCHC: 33.4 g/dL (ref 30.0–36.0)
MCV: 91.7 fL (ref 78.0–100.0)
Monocytes Absolute: 0.4 10*3/uL (ref 0.1–1.0)
Monocytes Relative: 11 % (ref 3–12)
Neutro Abs: 2.2 10*3/uL (ref 1.7–7.7)
Neutrophils Relative %: 58 % (ref 43–77)
Platelets: 122 10*3/uL — ABNORMAL LOW (ref 150–400)
RBC: 5.45 MIL/uL (ref 4.22–5.81)
RDW: 14.2 % (ref 11.5–15.5)
WBC: 3.8 10*3/uL — AB (ref 4.0–10.5)

## 2014-09-22 LAB — ETHANOL: Alcohol, Ethyl (B): <11 mg/dL (ref 0–11)

## 2014-09-22 LAB — PROTIME-INR
INR: 2.24 — ABNORMAL HIGH (ref 0.00–1.49)
Prothrombin Time: 24.8 seconds — ABNORMAL HIGH (ref 11.6–15.2)

## 2014-09-22 LAB — CBG MONITORING, ED: GLUCOSE-CAPILLARY: 90 mg/dL (ref 70–99)

## 2014-09-22 MED ORDER — SODIUM CHLORIDE 0.9 % IV SOLN
INTRAVENOUS | Status: DC
Start: 2014-09-22 — End: 2014-09-23
  Administered 2014-09-22: 22:00:00 via INTRAVENOUS

## 2014-09-22 NOTE — ED Notes (Signed)
Bed: RESB Expected date:  Expected time:  Means of arrival:  Comments: EMS/67 yo male with seizure-hx of seizures-combative

## 2014-09-22 NOTE — ED Provider Notes (Signed)
CSN: [id number]     Arrival date & time 09/22/14  2046 History   First MD Initiated Contact with Patient 09/22/14 2052     Chief Complaint  Patient presents with  . Seizures   HPI The patient presents to emergency room with complaints of a seizure. This was not witnessed but it was presumed that the patient had a seizure. He does have a history of seizure disorder. Family found the patient confused and they presumed that he was postictal. The patient does have a history of prior seizures. When EMS first arrived the patient was very agitated. He was given doses of Haldol. Patient states the last thing he remembers was getting some rest on the couch. He does remember EMS arriving. He denies any complaints currently. He is not having any chest pain or headache. He denies any fever or shortness of breath. He denies any numbness or weakness. He states he has been taking all his medications. Past Medical History  Diagnosis Date  . CAD (coronary artery disease)   . Chronic systolic heart failure   . Ischemic cardiomyopathy     EF 15 to 20% by TTE and TEE in 09/2012.  Severe LV dysfunction  . HTN (hypertension)   . Hyperlipidemia   . Obesity     BMI 31 in 09/2012  . Atrial fibrillation   09/22/2012  . implantable cardiac defibrillator-Biotronik     Device Implanted 2006; s/p gen change 03/2011 : bleeding persistent with pocket erosion and infection; explant and reimplant  06/2011  . Stroke   . 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 History of MRSA infection 05/2011  . Seizure disorder latest 09/30/2012  . 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 Seizures    Past Surgical History  Procedure Laterality Date  . Coronary artery bypass graft    . Tee without cardioversion  09/24/2012    Procedure: TRANSESOPHAGEAL ECHOCARDIOGRAM (TEE);  Surgeon: [name], MD;  Location: [name];  Service: Cardiovascular;  Laterality: N/A;  dave/anesth, dl, cindy/echo   . Cardioversion  09/24/2012    Procedure:  CARDIOVERSION;  Surgeon: [name], MD;  Location: [facility] ENDOSCOPY;  Service: Cardiovascular;  Laterality: N/A;  . Icd     No family history on file. History  Substance Use Topics  . Smoking status: Former Smoker    Types: Pipe    Quit date: 09/21/2008  . Smokeless tobacco: Former Systems developer    Quit date: 09/21/2008  . Alcohol Use: No    Review of Systems  All other systems reviewed and are negative.     Allergies  Review of patient's allergies indicates no known allergies.  Home Medications   Prior to Admission medications   Medication Sig Start Date End Date Taking? Authorizing Provider  carvedilol (COREG) 25 MG tablet Take 1 tablet (25 mg total) by mouth 2 (two) times daily. 07/29/14  Yes [name], MD  furosemide (LASIX) 40 MG tablet Take 1 tablet (40 mg total) by mouth daily. 03/19/14  Yes [name], MD  lisinopril (PRINIVIL,ZESTRIL) 5 MG tablet Take 1 tablet (5 mg total) by mouth daily. 04/22/14  Yes Brooke O Edmisten, PA-C  phenytoin (DILANTIN) 100 MG ER capsule Take 100 mg by mouth daily.   Yes Historical Provider, MD  warfarin (COUMADIN) 5 MG tablet Take 1 and 1/2 tablets everyday EXCEPT take only 1 tablet on Mondays and Fridays or as directed by coumadin clinic 05/10/14  Yes [name], MD   BP 127/80  Temp(Src) 97.8 F (36.6 C) (Oral)  Resp 24  Ht 5\' 11"  (1.803 m)  Wt 206 lb (93.441 kg)  BMI 28.74 kg/m2  SpO2 100% Physical Exam  Nursing note and vitals reviewed. Constitutional: He appears well-developed and well-nourished. No distress.  HENT:  Head: Normocephalic and atraumatic.  Right Ear: External ear normal.  Left Ear: External ear normal.  Eyes: Conjunctivae are normal. Right eye exhibits no discharge. Left eye exhibits no discharge. No scleral icterus.  Neck: Neck supple. No tracheal deviation present.  Cardiovascular: Normal rate, regular rhythm and intact distal pulses.   Pulmonary/Chest: Effort normal and breath sounds normal. No stridor. No  respiratory distress. He has no wheezes. He has no rales.  Abdominal: Soft. Bowel sounds are normal. He exhibits no distension. There is no tenderness. There is no rebound and no guarding.  Musculoskeletal: He exhibits no edema and no tenderness.  Neurological: He is alert. He has normal strength. No cranial nerve deficit (no facial droop, extraocular movements intact, no slurred speech) or sensory deficit. He exhibits normal muscle tone. He displays no seizure activity. Coordination normal.  Skin: Skin is warm and dry. No rash noted.  Psychiatric: He has a normal mood and affect.    ED Course  Procedures (including critical care time) Labs Review Labs Reviewed  CBC WITH DIFFERENTIAL - Abnormal; Notable for the following:    WBC 3.8 (*)    Platelets 122 (*)    All other components within normal limits  COMPREHENSIVE METABOLIC PANEL - Abnormal; Notable for the following:    Sodium 134 (*)    BUN 24 (*)    GFR calc non Af Amer 64 (*)    GFR calc Af Amer 74 (*)    All other components within normal limits  PHENYTOIN LEVEL, TOTAL - Abnormal; Notable for the following:    Phenytoin Lvl <2.5 (*)    All other components within normal limits  PROTIME-INR - Abnormal; Notable for the following:    Prothrombin Time 24.8 (*)    INR 2.24 (*)    All other components within normal limits  ETHANOL  CBG MONITORING, ED    Medications  0.9 %  sodium chloride infusion ( Intravenous New Bag/Given 09/22/14 2140)  phenytoin (DILANTIN) 1,000 mg in sodium chloride 0.9 % 250 mL IVPB (not administered)     MDM   Final diagnoses:  Seizure disorder    No seizure activity while in the ED.  Dilantin level was subtherapeutic.  IV dilantin given in the ED.  Dc home.  Follow up with PCP to recheck level.    Dorie Rank, MD 09/23/14 [phone]

## 2014-09-22 NOTE — ED Notes (Signed)
Per EMS, patient had an unwitnessed seizure.  He was extremely agitated upon their arrival.  He would not cooperate with family or EMS.  He began cooperating and answering questions appropriately with no agitation closer to arrival to ED.  During transport he had a total of 5mg  of IM Haldol (two 2.5 mg doses).  Patient is alert and oriented upon arrival to ED.  He does not appear to be postictal.

## 2014-09-22 NOTE — ED Notes (Signed)
Patient's home called per his request.  [phone].  Spoke with son-in-law; and updated on patient's status.

## 2014-09-23 DIAGNOSIS — I2510 Atherosclerotic heart disease of native coronary artery without angina pectoris: Secondary | ICD-10-CM | POA: Diagnosis not present

## 2014-09-23 DIAGNOSIS — Z8614 Personal history of Methicillin resistant Staphylococcus aureus infection: Secondary | ICD-10-CM | POA: Diagnosis not present

## 2014-09-23 DIAGNOSIS — I5022 Chronic systolic (congestive) heart failure: Secondary | ICD-10-CM | POA: Diagnosis not present

## 2014-09-23 DIAGNOSIS — G40909 Epilepsy, unspecified, not intractable, without status epilepticus: Secondary | ICD-10-CM | POA: Diagnosis present

## 2014-09-23 DIAGNOSIS — Z87891 Personal history of nicotine dependence: Secondary | ICD-10-CM | POA: Diagnosis not present

## 2014-09-23 DIAGNOSIS — E669 Obesity, unspecified: Secondary | ICD-10-CM | POA: Diagnosis not present

## 2014-09-23 DIAGNOSIS — I4891 Unspecified atrial fibrillation: Secondary | ICD-10-CM | POA: Diagnosis not present

## 2014-09-23 DIAGNOSIS — Z79899 Other long term (current) drug therapy: Secondary | ICD-10-CM | POA: Diagnosis not present

## 2014-09-23 DIAGNOSIS — I10 Essential (primary) hypertension: Secondary | ICD-10-CM | POA: Diagnosis not present

## 2014-09-23 DIAGNOSIS — Z7901 Long term (current) use of anticoagulants: Secondary | ICD-10-CM | POA: Diagnosis not present

## 2014-09-23 DIAGNOSIS — Z8673 Personal history of transient ischemic attack (TIA), and cerebral infarction without residual deficits: Secondary | ICD-10-CM | POA: Diagnosis not present

## 2014-09-23 DIAGNOSIS — Z95810 Presence of automatic (implantable) cardiac defibrillator: Secondary | ICD-10-CM | POA: Diagnosis not present

## 2014-09-23 DIAGNOSIS — Z951 Presence of aortocoronary bypass graft: Secondary | ICD-10-CM | POA: Diagnosis not present

## 2014-09-23 LAB — PHENYTOIN LEVEL, TOTAL: Phenytoin Lvl: <2.5 ug/mL — ABNORMAL LOW (ref 10.0–20.0)

## 2014-09-23 MED ORDER — PHENYTOIN SODIUM 50 MG/ML IJ SOLN
1000.0000 mg | Freq: Once | INTRAMUSCULAR | Status: AC
  Administered 2014-09-23: 1000 mg via INTRAVENOUS
  Filled 2014-09-23: qty 20

## 2014-09-23 NOTE — ED Notes (Signed)
Patient now awake--alert and oriented x 4 Patient asking if his family members have been called--patient informed that family has been called numerous time by both night and day shift with no answer Patient asking to use the phone, so he may call his pastor for a ride home

## 2014-09-23 NOTE — ED Notes (Signed)
Attempted to call family again, no answer.    Tressie Stalker (HCPOA) [phone]

## 2014-09-23 NOTE — ED Notes (Signed)
For safety reasons, patient is in bed in room until someone arrives to drive him home.

## 2014-09-23 NOTE — ED Notes (Signed)
Assumed care of patient Patient resting comfortably with eyes closed RR WNL--even and unlabored with equal rise and fall of chest Patient in NAD, VSS As previously documented, patient's family has been contacted multiple times to make aware that patient has been DC and needs to be transported home--No answer, messages left  This nurse called Tressie Stalker at [phone] in attempts to make contact--No answer, message left again with call back number

## 2014-09-23 NOTE — ED Notes (Signed)
Pt escorted to discharge window. Verbalized understanding discharge instructions. In no acute distress.   

## 2014-09-23 NOTE — ED Notes (Signed)
Have attempted to contact pt's family. Msg left to contact ED.

## 2014-09-23 NOTE — Discharge Instructions (Signed)

## 2014-09-30 ENCOUNTER — Ambulatory Visit (INDEPENDENT_AMBULATORY_CARE_PROVIDER_SITE_OTHER): Payer: Medicare Other

## 2014-09-30 DIAGNOSIS — Z5181 Encounter for therapeutic drug level monitoring: Secondary | ICD-10-CM

## 2014-09-30 DIAGNOSIS — I4891 Unspecified atrial fibrillation: Secondary | ICD-10-CM

## 2014-09-30 DIAGNOSIS — Z7901 Long term (current) use of anticoagulants: Secondary | ICD-10-CM

## 2014-09-30 LAB — POCT INR: INR: 2.6

## 2014-09-30 MED ORDER — WARFARIN SODIUM 5 MG PO TABS: ORAL_TABLET | ORAL | Status: DC

## 2014-10-05 ENCOUNTER — Encounter: Payer: Medicare Other | Admitting: *Deleted

## 2014-10-05 ENCOUNTER — Telehealth: Payer: Self-pay | Admitting: Cardiology

## 2014-10-05 NOTE — Telephone Encounter (Signed)
LMOVM reminding pt to send remote transmission.   

## 2014-10-08 ENCOUNTER — Encounter: Payer: Self-pay | Admitting: Cardiology

## 2014-10-26 ENCOUNTER — Encounter: Payer: Self-pay | Admitting: *Deleted

## 2014-10-27 ENCOUNTER — Ambulatory Visit (INDEPENDENT_AMBULATORY_CARE_PROVIDER_SITE_OTHER): Payer: Medicare Other | Admitting: Pharmacist

## 2014-10-27 DIAGNOSIS — Z5181 Encounter for therapeutic drug level monitoring: Secondary | ICD-10-CM

## 2014-10-27 DIAGNOSIS — I4891 Unspecified atrial fibrillation: Secondary | ICD-10-CM

## 2014-10-27 DIAGNOSIS — Z7901 Long term (current) use of anticoagulants: Secondary | ICD-10-CM

## 2014-10-27 LAB — POCT INR: INR: 2.1

## 2014-11-03 ENCOUNTER — Encounter: Payer: Self-pay | Admitting: Cardiology

## 2014-11-24 ENCOUNTER — Other Ambulatory Visit: Payer: Self-pay | Admitting: *Deleted

## 2014-11-24 ENCOUNTER — Ambulatory Visit (INDEPENDENT_AMBULATORY_CARE_PROVIDER_SITE_OTHER): Payer: Medicare Other | Admitting: *Deleted

## 2014-11-24 DIAGNOSIS — Z7901 Long term (current) use of anticoagulants: Secondary | ICD-10-CM

## 2014-11-24 DIAGNOSIS — I4891 Unspecified atrial fibrillation: Secondary | ICD-10-CM

## 2014-11-24 DIAGNOSIS — Z5181 Encounter for therapeutic drug level monitoring: Secondary | ICD-10-CM

## 2014-11-24 LAB — POCT INR: INR: 2.3

## 2014-11-24 MED ORDER — LISINOPRIL 5 MG PO TABS: 5.0000 mg | ORAL_TABLET | Freq: Every day | ORAL | Status: DC

## 2014-11-24 MED ORDER — WARFARIN SODIUM 5 MG PO TABS: ORAL_TABLET | ORAL | Status: DC

## 2014-11-26 ENCOUNTER — Other Ambulatory Visit: Payer: Self-pay | Admitting: Internal Medicine

## 2014-11-26 ENCOUNTER — Telehealth: Payer: Self-pay | Admitting: Internal Medicine

## 2014-11-26 NOTE — Telephone Encounter (Signed)
Follow up     Patient calling regarding refills.

## 2015-01-05 ENCOUNTER — Emergency Department (HOSPITAL_COMMUNITY)
Admission: EM | Admit: 2015-01-05 | Discharge: 2015-01-05 | Disposition: A | Discharge status: Home / Self Care | Payer: Medicare Other | Attending: Emergency Medicine | Admitting: Emergency Medicine

## 2015-01-05 ENCOUNTER — Encounter: Payer: Self-pay | Admitting: Cardiovascular Disease

## 2015-01-05 ENCOUNTER — Ambulatory Visit (INDEPENDENT_AMBULATORY_CARE_PROVIDER_SITE_OTHER): Payer: Medicare Other | Admitting: *Deleted

## 2015-01-05 ENCOUNTER — Encounter (HOSPITAL_COMMUNITY): Payer: Self-pay | Admitting: Emergency Medicine

## 2015-01-05 ENCOUNTER — Ambulatory Visit (INDEPENDENT_AMBULATORY_CARE_PROVIDER_SITE_OTHER): Payer: Medicare Other | Admitting: Cardiovascular Disease

## 2015-01-05 ENCOUNTER — Other Ambulatory Visit: Payer: Self-pay | Admitting: *Deleted

## 2015-01-05 VITALS — BP 150/100 | HR 88

## 2015-01-05 DIAGNOSIS — R4189 Other symptoms and signs involving cognitive functions and awareness: Secondary | ICD-10-CM

## 2015-01-05 DIAGNOSIS — I4891 Unspecified atrial fibrillation: Secondary | ICD-10-CM | POA: Diagnosis not present

## 2015-01-05 DIAGNOSIS — R5601 Complex febrile convulsions: Secondary | ICD-10-CM | POA: Diagnosis not present

## 2015-01-05 DIAGNOSIS — Z5181 Encounter for therapeutic drug level monitoring: Secondary | ICD-10-CM | POA: Diagnosis not present

## 2015-01-05 DIAGNOSIS — D682 Hereditary deficiency of other clotting factors: Secondary | ICD-10-CM | POA: Insufficient documentation

## 2015-01-05 DIAGNOSIS — R404 Transient alteration of awareness: Secondary | ICD-10-CM

## 2015-01-05 DIAGNOSIS — R55 Syncope and collapse: Secondary | ICD-10-CM | POA: Diagnosis not present

## 2015-01-05 DIAGNOSIS — I2510 Atherosclerotic heart disease of native coronary artery without angina pectoris: Secondary | ICD-10-CM | POA: Diagnosis not present

## 2015-01-05 DIAGNOSIS — I5022 Chronic systolic (congestive) heart failure: Secondary | ICD-10-CM | POA: Diagnosis not present

## 2015-01-05 DIAGNOSIS — Z7901 Long term (current) use of anticoagulants: Secondary | ICD-10-CM | POA: Insufficient documentation

## 2015-01-05 DIAGNOSIS — Z95810 Presence of automatic (implantable) cardiac defibrillator: Secondary | ICD-10-CM | POA: Insufficient documentation

## 2015-01-05 DIAGNOSIS — G40909 Epilepsy, unspecified, not intractable, without status epilepticus: Secondary | ICD-10-CM | POA: Diagnosis not present

## 2015-01-05 DIAGNOSIS — I255 Ischemic cardiomyopathy: Secondary | ICD-10-CM | POA: Diagnosis not present

## 2015-01-05 DIAGNOSIS — Z8614 Personal history of Methicillin resistant Staphylococcus aureus infection: Secondary | ICD-10-CM | POA: Insufficient documentation

## 2015-01-05 DIAGNOSIS — Z8673 Personal history of transient ischemic attack (TIA), and cerebral infarction without residual deficits: Secondary | ICD-10-CM | POA: Insufficient documentation

## 2015-01-05 DIAGNOSIS — E669 Obesity, unspecified: Secondary | ICD-10-CM | POA: Diagnosis not present

## 2015-01-05 DIAGNOSIS — Z79899 Other long term (current) drug therapy: Secondary | ICD-10-CM | POA: Diagnosis not present

## 2015-01-05 DIAGNOSIS — Z87891 Personal history of nicotine dependence: Secondary | ICD-10-CM | POA: Insufficient documentation

## 2015-01-05 DIAGNOSIS — R4020 Unspecified coma: Secondary | ICD-10-CM

## 2015-01-05 DIAGNOSIS — I10 Essential (primary) hypertension: Secondary | ICD-10-CM | POA: Diagnosis not present

## 2015-01-05 LAB — MDC_IDC_ENUM_SESS_TYPE_INCLINIC
Battery Voltage: 3.1 V
Brady Statistic RV Percent Paced: 0 %
Date Time Interrogation Session: 20160113105512
HIGH POWER IMPEDANCE MEASURED VALUE: 60 Ohm
Implantable Pulse Generator Model: 540
Lead Channel Impedance Value: 509 Ohm
Lead Channel Setting Pacing Pulse Width: 0.4 ms
MDC IDC MSMT LEADCHNL RV PACING THRESHOLD AMPLITUDE: 0.5 V
MDC IDC MSMT LEADCHNL RV PACING THRESHOLD PULSEWIDTH: 0.4 ms
MDC IDC MSMT LEADCHNL RV SENSING INTR AMPL: 12.4 mV
MDC IDC PG SERIAL: 60613417
MDC IDC SET LEADCHNL RV PACING AMPLITUDE: 2 V
MDC IDC SET LEADCHNL RV SENSING SENSITIVITY: 0.8 mV
Zone Setting Detection Interval: 260 ms
Zone Setting Detection Interval: 400 ms

## 2015-01-05 LAB — CBG MONITORING, ED: Glucose-Capillary: 116 mg/dL — ABNORMAL HIGH (ref 70–99)

## 2015-01-05 LAB — I-STAT CHEM 8, ED
BUN: 18 mg/dL (ref 6–23)
Calcium, Ion: 1.14 mmol/L (ref 1.13–1.30)
Chloride: 105 mEq/L (ref 96–112)
Creatinine, Ser: 0.9 mg/dL (ref 0.50–1.35)
Glucose, Bld: 91 mg/dL (ref 70–99)
HEMATOCRIT: 57 % — AB (ref 39.0–52.0)
Hemoglobin: 19.4 g/dL — ABNORMAL HIGH (ref 13.0–17.0)
Potassium: 4.5 mmol/L (ref 3.5–5.1)
SODIUM: 140 mmol/L (ref 135–145)
TCO2: 23 mmol/L (ref 0–100)

## 2015-01-05 LAB — CBC WITH DIFFERENTIAL/PLATELET
Basophils Absolute: 0 10*3/uL (ref 0.0–0.1)
Basophils Relative: 1 % (ref 0–1)
Eosinophils Absolute: 0.2 10*3/uL (ref 0.0–0.7)
Eosinophils Relative: 4 % (ref 0–5)
HCT: 51.4 % (ref 39.0–52.0)
Hemoglobin: 17 g/dL (ref 13.0–17.0)
Lymphocytes Relative: 31 % (ref 12–46)
Lymphs Abs: 1.2 10*3/uL (ref 0.7–4.0)
MCH: 30.5 pg (ref 26.0–34.0)
MCHC: 33.1 g/dL (ref 30.0–36.0)
MCV: 92.3 fL (ref 78.0–100.0)
Monocytes Absolute: 0.5 10*3/uL (ref 0.1–1.0)
Monocytes Relative: 14 % — ABNORMAL HIGH (ref 3–12)
NEUTROS PCT: 50 % (ref 43–77)
Neutro Abs: 2 10*3/uL (ref 1.7–7.7)
PLATELETS: 126 10*3/uL — AB (ref 150–400)
RBC: 5.57 MIL/uL (ref 4.22–5.81)
RDW: 13.6 % (ref 11.5–15.5)
WBC: 3.9 10*3/uL — AB (ref 4.0–10.5)

## 2015-01-05 LAB — BASIC METABOLIC PANEL
ANION GAP: 6 (ref 5–15)
BUN: 15 mg/dL (ref 6–23)
CO2: 26 mmol/L (ref 19–32)
Calcium: 8.5 mg/dL (ref 8.4–10.5)
Chloride: 101 mEq/L (ref 96–112)
Creatinine, Ser: 0.96 mg/dL (ref 0.50–1.35)
GFR calc Af Amer: >90 mL/min (ref >90)
GFR, EST NON AFRICAN AMERICAN: 84 mL/min — AB (ref >90)
GLUCOSE: 88 mg/dL (ref 70–99)
Potassium: 4.4 mmol/L (ref 3.5–5.1)
Sodium: 133 mmol/L — ABNORMAL LOW (ref 135–145)

## 2015-01-05 LAB — PHENYTOIN LEVEL, TOTAL: Phenytoin Lvl: <2.5 ug/mL — ABNORMAL LOW (ref 10.0–20.0)

## 2015-01-05 LAB — POCT INR: INR: 2.6

## 2015-01-05 MED ORDER — FUROSEMIDE 40 MG PO TABS: 40.0000 mg | ORAL_TABLET | Freq: Every day | ORAL | Status: DC

## 2015-01-05 NOTE — ED Provider Notes (Signed)
Medical screening examination/treatment/procedure(s) were conducted as a shared visit with non-physician practitioner(s) and myself.  I personally evaluated the patient during the encounter.   EKG Interpretation   Date/Time:  Wednesday January 05 2015 10:44:14 EST Ventricular Rate:  65 PR Interval:  165 QRS Duration: 94 QT Interval:  438 QTC Calculation: 455 R Axis:   -24 Text Interpretation:  Sinus rhythm Probable left atrial enlargement  Borderline left axis deviation Consider anterior infarct Borderline  repolarization abnormality Baseline wander in lead(s) I III aVL No  significant change since last tracing Confirmed by Aamori Mcmasters  MD, Asjah Rauda  ([id number]) on 01/05/2015 10:49:39 AM      Patient sent over from heart care by EMS. Patient has a pacemaker defibrillator in place it's been there for 4 years. It is located on the right side of his chest. Patient was having it interrogated and according to folks there he was unresponsive for approximately 2 minutes but it sounds as if there was not any period of unconsciousness. He just was staring off into space. Patient has a history of seizures takes medicine for this however those are described as more generalized seizures. Could've been the beginning of wander did not complete. His seizure medicine as Dilantin he's also on Coumadin. We got word that the interrogation was not completed. And will have arrangements done for patient to have completion of the interrogation here. Patient will also have basic labs. Patient's in no acute distress series alert oriented states that he remembers them talking to him. On physical exam heart is regular without murmurs lungs are clear bilaterally well-healed midline incision of the chest palpable pacemaker defibrillator in the right chest. Patient denies chest pain and shortness of breath.  Fredia Sorrow, MD 01/05/15 1157

## 2015-01-05 NOTE — ED Provider Notes (Signed)
CSN: [id number]     Arrival date & time 01/05/15  1035 History   First MD Initiated Contact with Patient 01/05/15 1037     Chief Complaint  Patient presents with  . Loss of Consciousness     (Consider location/radiation/quality/duration/timing/severity/associated sxs/prior Treatment) HPI Comments: Patient is a 69 year old male with a past medical history of CAD, CHF, ischemic cardiomyopathy, hypertension, afib with defibrillator in place, seizure disorder, and previous stroke who presents from the cardiologist office after having an unresponsive episode prior to arrival. Patient was at the Cardiologist having his pacemake interrogated when he suddenly started staring off in the distance and was unresponsive to voices. People around him said this occurred for about 2 minutes before patient started to respond. Patient reports his seizures are generalized tonic clonic and have never been similar to the unresponsive episode. Patient reports being able to hear people around him and never became unconscious. Patient denies any symptoms such as chest pain, dizziness, abdominal pain, SOB. No aggravating/alleviating factors.    Past Medical History  Diagnosis Date  . CAD (coronary artery disease)   . Chronic systolic heart failure   . Ischemic cardiomyopathy     EF 15 to 20% by TTE and TEE in 09/2012.  Severe LV dysfunction  . HTN (hypertension)   . Hyperlipidemia   . Obesity     BMI 31 in 09/2012  . Atrial fibrillation   09/22/2012  . implantable cardiac defibrillator-Biotronik     Device Implanted 2006; s/p gen change 03/2011 : bleeding persistent with pocket erosion and infection; explant and reimplant  06/2011  . Stroke   . 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 History of MRSA infection 05/2011  . Seizure disorder latest 09/30/2012  . 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 Seizures    Past Surgical History  Procedure Laterality Date  . Coronary artery bypass graft    . Tee without cardioversion   09/24/2012    Procedure: TRANSESOPHAGEAL ECHOCARDIOGRAM (TEE);  Surgeon: [name], MD;  Location: [place];  Service: Cardiovascular;  Laterality: N/A;  dave/anesth, dl, cindy/echo   . Cardioversion  09/24/2012    Procedure: CARDIOVERSION;  Surgeon: [name], MD;  Location: [place];  Service: Cardiovascular;  Laterality: N/A;  . Icd     History reviewed. No pertinent family history. History  Substance Use Topics  . Smoking status: Former Smoker    Types: Pipe    Quit date: 09/21/2008  . Smokeless tobacco: Former Systems developer    Quit date: 09/21/2008  . Alcohol Use: No    Review of Systems  Constitutional: Negative for fever, chills and fatigue.  HENT: Negative for trouble swallowing.   Eyes: Negative for visual disturbance.  Respiratory: Negative for shortness of breath.   Cardiovascular: Negative for chest pain and palpitations.  Gastrointestinal: Negative for nausea, vomiting, abdominal pain and diarrhea.  Genitourinary: Negative for dysuria and difficulty urinating.  Musculoskeletal: Negative for arthralgias and neck pain.  Skin: Negative for color change.  Neurological: Positive for syncope. Negative for dizziness and weakness.  Psychiatric/Behavioral: Negative for dysphoric mood.      Allergies  Review of patient's allergies indicates no known allergies.  Home Medications   Prior to Admission medications   Medication Sig Start Date End Date Taking? Authorizing Provider  carvedilol (COREG) 25 MG tablet Take 1 tablet (25 mg total) by mouth 2 (two) times daily. 07/29/14  Yes [name], MD  furosemide (LASIX) 40 MG tablet Take 1 tablet (40  mg total) by mouth daily. 01/05/15  Yes [name], MD  lisinopril (PRINIVIL,ZESTRIL) 5 MG tablet Take 1 tablet (5 mg total) by mouth daily. 11/24/14  Yes [name], MD  phenytoin (DILANTIN) 100 MG ER capsule Take 100 mg by mouth daily.   Yes Historical Provider, MD  warfarin (COUMADIN) 5 MG tablet Take as  directed by Coumadin clinic Patient taking differently: Take 5 mg by mouth one time only at 6 PM. Take 1 tablet on Sun, Mon, Wed, Fri. Take 1 1/2 tablet on Tues, Thurs, Sat. 11/24/14  Yes [name], MD   BP 154/109 mmHg  Pulse 66  Temp(Src) 97.7 F (36.5 C) (Oral)  Resp 20  Ht 5\' 11"  (1.803 m)  SpO2 99% Physical Exam  Constitutional: He is oriented to person, place, and time. He appears well-developed and well-nourished. No distress.  HENT:  Head: Normocephalic and atraumatic.  Eyes: Conjunctivae and EOM are normal. Pupils are equal, round, and reactive to light.  Neck: Normal range of motion.  Cardiovascular: Normal rate and regular rhythm.  Exam reveals no gallop and no friction rub.   No murmur heard. Pulmonary/Chest: Effort normal and breath sounds normal. He has no wheezes. He has no rales. He exhibits no tenderness.  Abdominal: Soft. He exhibits no distension. There is no tenderness. There is no rebound.  Musculoskeletal: Normal range of motion.  Neurological: He is alert and oriented to person, place, and time. No cranial nerve deficit. Coordination normal.  Extremity strength and sensation equal and intact bilaterally. Speech is goal-oriented. Moves limbs without ataxia.   Skin: Skin is warm and dry.  Psychiatric: He has a normal mood and affect. His behavior is normal.  Nursing note and vitals reviewed.   ED Course  Procedures (including critical care time) Labs Review Labs Reviewed  PHENYTOIN LEVEL, TOTAL - Abnormal; Notable for the following:    Phenytoin Lvl <2.5 (*)    All other components within normal limits  CBC WITH DIFFERENTIAL - Abnormal; Notable for the following:    WBC 3.9 (*)    Platelets 126 (*)    Monocytes Relative 14 (*)    All other components within normal limits  BASIC METABOLIC PANEL - Abnormal; Notable for the following:    Sodium 133 (*)    GFR calc non Af Amer 84 (*)    All other components within normal limits  CBG MONITORING, ED -  Abnormal; Notable for the following:    Glucose-Capillary 116 (*)    All other components within normal limits  I-STAT CHEM 8, ED - Abnormal; Notable for the following:    Hemoglobin 19.4 (*)    HCT 57.0 (*)    All other components within normal limits    Imaging Review No results found.   EKG Interpretation   Date/Time:  Wednesday January 05 2015 10:44:14 EST Ventricular Rate:  65 PR Interval:  165 QRS Duration: 94 QT Interval:  438 QTC Calculation: 455 R Axis:   -24 Text Interpretation:  Sinus rhythm Probable left atrial enlargement  Borderline left axis deviation Consider anterior infarct Borderline  repolarization abnormality Baseline wander in lead(s) I III aVL No  significant change since last tracing Confirmed by ZACKOWSKI  MD, SCOTT  ([id number]) on 01/05/2015 10:49:39 AM      MDM   Final diagnoses:  Unresponsive episode   1:01 PM Labs unremarkable for acute changes. Vitals stable and patient afebrile. Patient given dilantin here. Patient is neurologically intact. Patient "feels fine"  and appears stable for discharge. Patient seen by Dr. Rogene Zavaleta also who thinks the patient possible experienced the beginning of a seizure but did not have tonic clonic activity. Pacemaker interrogation unremarkable for acute changes. Patient instructed to return with worsening or concerning symptoms.     Alvina Chou, PA-C 01/05/15 1309

## 2015-01-05 NOTE — Progress Notes (Signed)
ICD check in clinic due to concern of new swelling/growth at device pocket---okay per Dr. Lovena Le. Normal device function. Threshold and sensing consistent with previous device measurements. Impedance trends stable over time. No evidence of any ventricular arrhythmias. Histogram distribution appropriate for patient and level of activity. No changes made this session. Device programmed at appropriate safety margins. Device programmed to optimize intrinsic conduction. Remaining battery 61%. New Home Monitor will be ordered. Remote 04/06/15 & ROV w/ Dr. Lovena Le 06/2015.   **Code was called on pt due to sudden non-responsiveness. Pt sent to [facility] ER via EMS.**

## 2015-01-05 NOTE — Discharge Instructions (Signed)
Follow up with your doctor as needed. Return to the Emergency Department with worsening or concerning symptoms.

## 2015-01-05 NOTE — Progress Notes (Signed)
Cardiology Office Note   Date:  01/05/2015   ID:  Louis Ford, DOB [birth year], MRN [id number]  PCP:  Antonietta Jewel, MD  Cardiologist:  Lovena Le    Chief Complaint  Patient presents with  . Loss of Consciousness     History of Present Illness: Louis Ford is a 69 y.o. male who presents today for cardiology evaluation.   The patient was in device clinic and had sudden loss of consciousness.  This lasted for 3-5 minutes.  He slowly came around. We gave him some Sprite to drink. Glucose was ~ 80 BP and HR were ok  When he work up , he said that he has a hx of seizures  No Bowel or bladder incontenene   Otherwise he felt well.   Past Medical History  Diagnosis Date  . CAD (coronary artery disease)   . Chronic systolic heart failure   . Ischemic cardiomyopathy     EF 15 to 20% by TTE and TEE in 09/2012.  Severe LV dysfunction  . HTN (hypertension)   . Hyperlipidemia   . Obesity     BMI 31 in 09/2012  . Atrial fibrillation   09/22/2012  . implantable cardiac defibrillator-Biotronik     Device Implanted 2006; s/p gen change 03/2011 : bleeding persistent with pocket erosion and infection; explant and reimplant  06/2011  . Stroke   . 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 History of MRSA infection 05/2011  . Seizure disorder latest 09/30/2012  . 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 Seizures     Current Outpatient Prescriptions  Medication Sig Dispense Refill  . carvedilol (COREG) 25 MG tablet Take 1 tablet (25 mg total) by mouth 2 (two) times daily. 180 tablet 3  . furosemide (LASIX) 40 MG tablet Take 1 tablet (40 mg total) by mouth daily. 30 tablet 5  . lisinopril (PRINIVIL,ZESTRIL) 5 MG tablet Take 1 tablet (5 mg total) by mouth daily. 30 tablet 6  . phenytoin (DILANTIN) 100 MG ER capsule Take 100 mg by mouth daily.    [name] warfarin (COUMADIN) 5 MG tablet Take as directed by Coumadin clinic (Patient taking differently: Take 5 mg by mouth one time only at 6 PM. Take 1 tablet on Sun,  Mon, Wed, Fri. Take 1 1/2 tablet on Tues, Thurs, Sat.) 45 tablet 3   No current facility-administered medications for this visit.    Allergies:   Review of patient's allergies indicates no known allergies.   Social History:  The patient  reports that he quit smoking about 6 years ago. His smoking use included Pipe. He quit smokeless tobacco use about 6 years ago. He reports that he does not drink alcohol or use illicit drugs.   Family History:  The patient's family history is not on file.    ROS:  Please see the history of present illness.       PHYSICAL EXAM: VS:  BP 150/100 mmHg  Pulse 88 , BMI There is no weight on file to calculate BMI. GEN: Well nourished,  Elderly gentleman,  Initially was unresponsive  HEENT: normal Neck: no JVD, carotid bruits, or masses Cardiac: RRR; no murmurs, rubs, or gallops,no edema  Respiratory:  clear to auscultation bilaterally, normal work of breathing GI: soft, nontender, nondistended, + BS MS: no deformity or atrophy Skin: warm and dry, no rash Neuro:  Initially was unresponsive, later regained his capacities  Psych:    EKG:  Device interrogation showed no significant arrhythmias  Recent Labs: 04/20/2014: TSH 1.900  09/22/2014: ALT 17 01/05/2015: BUN 18; Creatinine 0.90; Hemoglobin 19.4*; Platelets 126*; Potassium 4.5; Sodium 140  No results found for requested labs within last 365 days.      Wt Readings from Last 3 Encounters:  09/22/14 206 lb (93.441 kg)  07/06/14 206 lb (93.441 kg)  04/22/14 202 lb 14.4 oz (92.035 kg)     Other studies Reviewed:   ASSESSMENT AND PLAN:  1.  Seizure:  He was unconscious for several minutes.    Code blue was called.    He received Sprite - glucose was ok Device was interrogated. - no arrhythmias BP and HR were ok INR is theraputic   After a few minutes, he felt much better but I have called EMS and would like for him to be evaluated in the ER.      Disposition:   FU with his medical doctor  re: seizures.    [name], [name], MD, [facility] 01/05/2015, 6:52 PM [address],  [place] Pager [phone]   [place] [address] [place] [address]  [phone] (office) [phone] (fax)

## 2015-01-05 NOTE — Addendum Note (Signed)
Addended by: Kendell Bane on: 01/05/2015 11:11 AM   Modules accepted: Level of Service

## 2015-01-05 NOTE — ED Notes (Signed)
CBG is 116. Notified nurse Levada Dy.

## 2015-01-05 NOTE — ED Notes (Signed)
Pt from [facility] via [facility] with c/o syncopal episode while having his pacemaker/defib interrogated.  Witnesses reports pt stared straight ahead and was unresponsive for approx 2 mins.  Interrogation was completed after the episode.  Pt has a hx of seizures, takes his medications as prescribed.  No postictal period noted.  Pt in NAD, A&Ox4.

## 2015-01-19 ENCOUNTER — Ambulatory Visit: Payer: Self-pay | Admitting: Internal Medicine

## 2015-02-01 ENCOUNTER — Encounter: Payer: Self-pay | Admitting: Internal Medicine

## 2015-02-02 DIAGNOSIS — R569 Unspecified convulsions: Secondary | ICD-10-CM | POA: Diagnosis not present

## 2015-02-04 ENCOUNTER — Telehealth: Payer: Self-pay | Admitting: Internal Medicine

## 2015-02-04 ENCOUNTER — Ambulatory Visit: Payer: Self-pay | Admitting: Internal Medicine

## 2015-02-04 NOTE — Telephone Encounter (Signed)
New Msg        Pt states his device transmitter was turning yellow and advised him to contact dr.   Please return pt call.

## 2015-02-07 NOTE — Telephone Encounter (Signed)
Per Industry/Col[place], home monitor has updated now.  (Yellow light meant monitor did not transmit the previous night).

## 2015-02-07 NOTE — Telephone Encounter (Signed)
F/u    Pt calling concerning message from last week on Friday. Please call pt

## 2015-02-16 ENCOUNTER — Ambulatory Visit (INDEPENDENT_AMBULATORY_CARE_PROVIDER_SITE_OTHER): Payer: Medicare Other | Admitting: *Deleted

## 2015-02-16 DIAGNOSIS — I4891 Unspecified atrial fibrillation: Secondary | ICD-10-CM

## 2015-02-16 DIAGNOSIS — Z5181 Encounter for therapeutic drug level monitoring: Secondary | ICD-10-CM | POA: Diagnosis not present

## 2015-02-16 DIAGNOSIS — Z7901 Long term (current) use of anticoagulants: Secondary | ICD-10-CM | POA: Diagnosis not present

## 2015-02-16 LAB — POCT INR: INR: 2.8

## 2015-03-03 DIAGNOSIS — I10 Essential (primary) hypertension: Secondary | ICD-10-CM | POA: Diagnosis not present

## 2015-03-29 ENCOUNTER — Other Ambulatory Visit: Payer: Self-pay

## 2015-03-29 MED ORDER — LISINOPRIL 5 MG PO TABS: 5.0000 mg | ORAL_TABLET | Freq: Every day | ORAL | Status: DC

## 2015-03-30 ENCOUNTER — Other Ambulatory Visit: Payer: Self-pay | Admitting: Internal Medicine

## 2015-04-01 ENCOUNTER — Ambulatory Visit (INDEPENDENT_AMBULATORY_CARE_PROVIDER_SITE_OTHER): Payer: Medicare Other

## 2015-04-01 DIAGNOSIS — Z5181 Encounter for therapeutic drug level monitoring: Secondary | ICD-10-CM | POA: Diagnosis not present

## 2015-04-01 DIAGNOSIS — Z7901 Long term (current) use of anticoagulants: Secondary | ICD-10-CM

## 2015-04-01 DIAGNOSIS — I4891 Unspecified atrial fibrillation: Secondary | ICD-10-CM | POA: Diagnosis not present

## 2015-04-01 LAB — POCT INR: INR: 3.5

## 2015-04-06 ENCOUNTER — Ambulatory Visit (INDEPENDENT_AMBULATORY_CARE_PROVIDER_SITE_OTHER): Payer: Medicare Other | Admitting: *Deleted

## 2015-04-06 DIAGNOSIS — I255 Ischemic cardiomyopathy: Secondary | ICD-10-CM

## 2015-04-06 LAB — MDC_IDC_ENUM_SESS_TYPE_REMOTE
HighPow Impedance: 57 Ohm
Implantable Pulse Generator Model: 540
Implantable Pulse Generator Serial Number: 60613417
Lead Channel Impedance Value: 495 Ohm
Lead Channel Pacing Threshold Amplitude: 0.5 V
Lead Channel Pacing Threshold Pulse Width: 0.4 ms
Lead Channel Sensing Intrinsic Amplitude: 12.9 mV
Lead Channel Setting Pacing Pulse Width: 0.4 ms
Lead Channel Setting Sensing Sensitivity: 0.8 mV
MDC IDC MSMT BATTERY VOLTAGE: 3.1 V
MDC IDC SET LEADCHNL RV PACING AMPLITUDE: 2 V
MDC IDC STAT BRADY RV PERCENT PACED: 0 %
Zone Setting Detection Interval: 260 ms
Zone Setting Detection Interval: 400 ms

## 2015-04-07 DIAGNOSIS — I255 Ischemic cardiomyopathy: Secondary | ICD-10-CM | POA: Diagnosis not present

## 2015-04-07 NOTE — Progress Notes (Signed)
Remote ICD transmission.   

## 2015-04-14 ENCOUNTER — Telehealth: Payer: Self-pay | Admitting: Cardiovascular Disease

## 2015-04-14 NOTE — Telephone Encounter (Signed)
Spoke w/SK in regards to swelling and recommends pt to come in for office visit. Pt was called and could not make it today for appointment due to [facility] bus not being available. Pt was scheduled for 04-15-15 at 1430 for evaluation of hand swelling per SK.

## 2015-04-14 NOTE — Telephone Encounter (Signed)
Pt c/o swelling: STAT is pt has developed SOB within 24 hours  1. How long have you been experiencing swelling? 48 Hours   2. Where is the swelling located? Right hand completley swollen Unable to ball a fist . Left hand thumb is numb with pain    3.  Are you currently taking a "fluid pill"? Yes  4.  Are you currently SOB? No   5.  Have you traveled recently?No   Pt called states that last night he felt he was about to pass out//sr

## 2015-04-14 NOTE — Telephone Encounter (Signed)
Pt called because he has been having swelling in right hand for 48 hours. Pt is unable to closed his hand, left hand thumb is numb and painful. Pt denies SOB nor chest pain also his right foot has small amount of swelling. Pt has taken his diuretic medication this AM. Pt said that he has a device in his right chest and he does not know if that is causing his arm swelling. Pt states that this is the first time this has happened. Pt is aware that I will call the device clinic. I Spoke with Erasmo Downer at the device clinic for recommendations.

## 2015-04-15 ENCOUNTER — Emergency Department (INDEPENDENT_AMBULATORY_CARE_PROVIDER_SITE_OTHER)
Admission: EM | Admit: 2015-04-15 | Discharge: 2015-04-15 | Disposition: A | Discharge status: Other Acute Inpatient Hospital | Payer: Medicare Other | Source: Home / Self Care | Attending: Family Medicine | Admitting: Family Medicine

## 2015-04-15 ENCOUNTER — Encounter: Payer: Self-pay | Admitting: Internal Medicine

## 2015-04-15 ENCOUNTER — Emergency Department (HOSPITAL_COMMUNITY): Payer: Self-pay

## 2015-04-15 ENCOUNTER — Ambulatory Visit (INDEPENDENT_AMBULATORY_CARE_PROVIDER_SITE_OTHER): Payer: Medicare Other | Admitting: *Deleted

## 2015-04-15 ENCOUNTER — Encounter (HOSPITAL_COMMUNITY): Payer: Self-pay | Admitting: *Deleted

## 2015-04-15 ENCOUNTER — Emergency Department (HOSPITAL_COMMUNITY)
Admission: EM | Admit: 2015-04-15 | Discharge: 2015-04-15 | Disposition: A | Discharge status: Home / Self Care | Payer: Self-pay | Attending: Emergency Medicine | Admitting: Emergency Medicine

## 2015-04-15 ENCOUNTER — Other Ambulatory Visit: Payer: Self-pay | Admitting: Internal Medicine

## 2015-04-15 ENCOUNTER — Encounter (HOSPITAL_COMMUNITY): Payer: Self-pay | Admitting: Emergency Medicine

## 2015-04-15 DIAGNOSIS — M130 Polyarthritis, unspecified: Secondary | ICD-10-CM

## 2015-04-15 DIAGNOSIS — M2550 Pain in unspecified joint: Secondary | ICD-10-CM

## 2015-04-15 DIAGNOSIS — M159 Polyosteoarthritis, unspecified: Secondary | ICD-10-CM | POA: Insufficient documentation

## 2015-04-15 DIAGNOSIS — M7989 Other specified soft tissue disorders: Secondary | ICD-10-CM

## 2015-04-15 DIAGNOSIS — I4891 Unspecified atrial fibrillation: Secondary | ICD-10-CM | POA: Insufficient documentation

## 2015-04-15 DIAGNOSIS — Z87891 Personal history of nicotine dependence: Secondary | ICD-10-CM | POA: Insufficient documentation

## 2015-04-15 DIAGNOSIS — I2510 Atherosclerotic heart disease of native coronary artery without angina pectoris: Secondary | ICD-10-CM | POA: Insufficient documentation

## 2015-04-15 DIAGNOSIS — R2241 Localized swelling, mass and lump, right lower limb: Secondary | ICD-10-CM | POA: Insufficient documentation

## 2015-04-15 DIAGNOSIS — Z8673 Personal history of transient ischemic attack (TIA), and cerebral infarction without residual deficits: Secondary | ICD-10-CM | POA: Insufficient documentation

## 2015-04-15 DIAGNOSIS — Z7901 Long term (current) use of anticoagulants: Secondary | ICD-10-CM | POA: Insufficient documentation

## 2015-04-15 DIAGNOSIS — I5022 Chronic systolic (congestive) heart failure: Secondary | ICD-10-CM | POA: Insufficient documentation

## 2015-04-15 LAB — MDC_IDC_ENUM_SESS_TYPE_INCLINIC
Battery Remaining Percentage: 56 %
Battery Voltage: 3.08 V
Brady Statistic RV Percent Paced: 0 %
HighPow Impedance: 60 Ohm
Lead Channel Sensing Intrinsic Amplitude: 12.9 mV
Lead Channel Setting Pacing Amplitude: 2 V
Lead Channel Setting Pacing Pulse Width: 0.4 ms
MDC IDC MSMT LEADCHNL RV IMPEDANCE VALUE: 495 Ohm
MDC IDC MSMT LEADCHNL RV PACING THRESHOLD AMPLITUDE: 0.4 V
MDC IDC MSMT LEADCHNL RV PACING THRESHOLD PULSEWIDTH: 0.4 ms
MDC IDC PG SERIAL: 60613417
MDC IDC SET LEADCHNL RV SENSING SENSITIVITY: 0.8 mV
MDC IDC SET ZONE DETECTION INTERVAL: 400 ms
Zone Setting Detection Interval: 260 ms

## 2015-04-15 LAB — CBC WITH DIFFERENTIAL/PLATELET
Basophils Absolute: 0 10*3/uL (ref 0.0–0.1)
Basophils Relative: 0 % (ref 0–1)
Eosinophils Absolute: 0 10*3/uL (ref 0.0–0.7)
Eosinophils Relative: 1 % (ref 0–5)
HCT: 47.8 % (ref 39.0–52.0)
Hemoglobin: 15.6 g/dL (ref 13.0–17.0)
Lymphocytes Relative: 18 % (ref 12–46)
Lymphs Abs: 1.3 10*3/uL (ref 0.7–4.0)
MCH: 29.8 pg (ref 26.0–34.0)
MCHC: 32.6 g/dL (ref 30.0–36.0)
MCV: 91.2 fL (ref 78.0–100.0)
Monocytes Absolute: 1.1 10*3/uL — ABNORMAL HIGH (ref 0.1–1.0)
Monocytes Relative: 16 % — ABNORMAL HIGH (ref 3–12)
Neutro Abs: 4.8 10*3/uL (ref 1.7–7.7)
Neutrophils Relative %: 65 % (ref 43–77)
Platelets: 139 10*3/uL — ABNORMAL LOW (ref 150–400)
RBC: 5.24 MIL/uL (ref 4.22–5.81)
RDW: 13.6 % (ref 11.5–15.5)
WBC: 7.2 10*3/uL (ref 4.0–10.5)

## 2015-04-15 LAB — COMPREHENSIVE METABOLIC PANEL
ALT: 14 U/L (ref 0–53)
AST: 17 U/L (ref 0–37)
Albumin: 3.3 g/dL — ABNORMAL LOW (ref 3.5–5.2)
Alkaline Phosphatase: 88 U/L (ref 39–117)
Anion gap: 10 (ref 5–15)
BUN: 17 mg/dL (ref 6–23)
CO2: 22 mmol/L (ref 19–32)
Calcium: 8.8 mg/dL (ref 8.4–10.5)
Chloride: 102 mmol/L (ref 96–112)
Creatinine, Ser: 1.19 mg/dL (ref 0.50–1.35)
GFR calc Af Amer: 72 mL/min — ABNORMAL LOW (ref >90)
GFR calc non Af Amer: 62 mL/min — ABNORMAL LOW (ref >90)
Glucose, Bld: 91 mg/dL (ref 70–99)
Potassium: 4.3 mmol/L (ref 3.5–5.1)
Sodium: 134 mmol/L — ABNORMAL LOW (ref 135–145)
Total Bilirubin: 1.4 mg/dL — ABNORMAL HIGH (ref 0.3–1.2)
Total Protein: 7.7 g/dL (ref 6.0–8.3)

## 2015-04-15 LAB — URIC ACID: URIC ACID, SERUM: 7 mg/dL (ref 4.0–7.8)

## 2015-04-15 LAB — PROTIME-INR
INR: 3.8 — AB (ref 0.00–1.49)
PROTHROMBIN TIME: 37.8 s — AB (ref 11.6–15.2)

## 2015-04-15 MED ORDER — PREDNISONE 20 MG PO TABS: 40.0000 mg | ORAL_TABLET | Freq: Every day | ORAL | Status: DC

## 2015-04-15 MED ORDER — HYDROCODONE-ACETAMINOPHEN 5-325 MG PO TABS: 2.0000 | ORAL_TABLET | ORAL | Status: DC | PRN

## 2015-04-15 MED ORDER — HYDROCODONE-ACETAMINOPHEN 5-325 MG PO TABS: 1.0000 | ORAL_TABLET | ORAL | Status: DC | PRN

## 2015-04-15 NOTE — ED Notes (Signed)
The pt is c/o a swollen rt hand   Lt thumb and his rt great toe since last Wednesday.  No previous history.  No known injury

## 2015-04-15 NOTE — ED Provider Notes (Signed)
Pt with 3 days of increasing STS in right thumb/hand, lt thumb, and right First MT. No h/o gout. Is anticoagulated.  Exam shows tissue swelling and erythema and warmth to the dorsum of the right hand, base of left thumb, in dorsum and medial aspect of the right great toe. Exam consistent with gout or inflammatory arthritis. 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 Doubt sepsis. Doubt hematoma. X-ray show no particular consultations. No acute bony abnormality spray do show arthritic changes. Agree with having him hold the dose of his Coumadin. Will avoid anti-inflammatories with his heart history. Plan prednisone, pain control, right wrist splint, primary care follow-up. ER if not improving.  Tanna Furry, MD 04/15/15 [id number]

## 2015-04-15 NOTE — Progress Notes (Signed)
Add-on per SK. Pt seeing GT/DOD due to swollen right hand (device on right side), swollen left thumb, and swollen feet. Normal device function. Threshold and sensing consistent with previous device measurements. Impedance trends stable over time. No evidence of any ventricular arrhythmias. Histogram distribution appropriate for patient and level of activity. No changes made this session. Device programmed at appropriate safety margins. Device programmed to optimize intrinsic conduction. Estimated longevity 56%, MOL1. ROV w/ Dr. Lovena Le in 77mo.

## 2015-04-15 NOTE — ED Provider Notes (Deleted)
Chart needs to be merged. For this encounter, see note entered under MRN: [id number]  Filed Vitals:   04/15/15 1743  BP: 135/99  Pulse: 70  Temp: 98.4 F (36.9 C)  TempSrc: Oral  Resp: 20  Height: 5\' 11"  (1.803 m)  Weight: 242 lb 7 oz (109.969 kg)  SpO2: 100%   Results for orders placed or performed during the hospital encounter of 04/15/15  CBC with Differential  Result Value Ref Range   WBC 7.2 4.0 - 10.5 K/uL   RBC 5.24 4.22 - 5.81 MIL/uL   Hemoglobin 15.6 13.0 - 17.0 g/dL   HCT 47.8 39.0 - 52.0 %   MCV 91.2 78.0 - 100.0 fL   MCH 29.8 26.0 - 34.0 pg   MCHC 32.6 30.0 - 36.0 g/dL   RDW 13.6 11.5 - 15.5 %   Platelets 139 (L) 150 - 400 K/uL   Neutrophils Relative % 65 43 - 77 %   Neutro Abs 4.8 1.7 - 7.7 K/uL   Lymphocytes Relative 18 12 - 46 %   Lymphs Abs 1.3 0.7 - 4.0 K/uL   Monocytes Relative 16 (H) 3 - 12 %   Monocytes Absolute 1.1 (H) 0.1 - 1.0 K/uL   Eosinophils Relative 1 0 - 5 %   Eosinophils Absolute 0.0 0.0 - 0.7 K/uL   Basophils Relative 0 0 - 1 %   Basophils Absolute 0.0 0.0 - 0.1 K/uL  Comprehensive metabolic panel  Result Value Ref Range   Sodium 134 (L) 135 - 145 mmol/L   Potassium 4.3 3.5 - 5.1 mmol/L   Chloride 102 96 - 112 mmol/L   CO2 22 19 - 32 mmol/L   Glucose, Bld 91 70 - 99 mg/dL   BUN 17 6 - 23 mg/dL   Creatinine, Ser 1.19 0.50 - 1.35 mg/dL   Calcium 8.8 8.4 - 10.5 mg/dL   Total Protein 7.7 6.0 - 8.3 g/dL   Albumin 3.3 (L) 3.5 - 5.2 g/dL   AST 17 0 - 37 U/L   ALT 14 0 - 53 U/L   Alkaline Phosphatase 88 39 - 117 U/L   Total Bilirubin 1.4 (H) 0.3 - 1.2 mg/dL   GFR calc non Af Amer 62 (L) >90 mL/min   GFR calc Af Amer 72 (L) >90 mL/min   Anion gap 10 5 - 15  Uric acid  Result Value Ref Range   Uric Acid, Serum 7.0 4.0 - 7.8 mg/dL     Antonietta Breach, PA-C 04/15/15 2035

## 2015-04-15 NOTE — ED Provider Notes (Signed)
CSN: [id number]     Arrival date & time 04/15/15  1731 History   First MD Initiated Contact with Patient 04/15/15 2013     Chief Complaint  Patient presents with  . Leg Swelling   Patient marked for merge with MRN [id number]  (Consider location/radiation/quality/duration/timing/severity/associated sxs/prior Treatment) HPI Comments: Patient is a 69 y/o male with a hx of CAD, CVA, chronic systolic heart failure (LVEF 15-20% s/p AICD placement), atrial fibrillation on chronic coumadin, Factor VII deficiency, and seizure disorder. He was transferred to the ED from Urgent Care for further evaluation of swelling to L thumb, R hand, and R great toe. Patient reports symptoms began 3 days ago as swelling to his R hand, mostly at his 2nd MCP joint. He states that this swelling has progressively worsened without modifying factors and has come to include pain and swelling to his L thumb and R great toe. He has had difficulty gripping with his R hand secondary to the swelling. He reports taking 1 aspirin tablet a few days ago with some improvement in his pain. He states that the pain is sharp at times. He denies a hx of similar symptoms as well as associated fever, syncope, CP, SOB, extremity numbness, erythema, recent trauma/injury, or a hx of gout. He denies ETOH use and frequent consumption of red meat. No FHx of gout. Concern at urgent care was for spontaneous hemarthrosis vs inflammatory arthritis.  The history is provided by the patient. No language interpreter was used.    Past Medical History  Diagnosis Date  . Stroke    History reviewed. No pertinent past surgical history. No family history on file. History  Substance Use Topics  . Smoking status: Former Research scientist (life sciences)  . Smokeless tobacco: Not on file  . Alcohol Use: No    Review of Systems  Constitutional: Negative for fever.  Respiratory: Negative for shortness of breath.   Cardiovascular: Negative for chest pain.  Musculoskeletal: Positive for  myalgias and joint swelling.  Skin: Negative for color change and pallor.  Neurological: Negative for numbness.  All other systems reviewed and are negative.   Allergies  Review of patient's allergies indicates no known allergies.  Home Medications   Prior to Admission medications   Medication Sig Start Date End Date Taking? Authorizing Provider  HYDROcodone-acetaminophen (NORCO/VICODIN) 5-325 MG per tablet Take 1 tablet by mouth every 4 (four) hours as needed. 04/15/15   Antonietta Breach, PA-C  predniSONE (DELTASONE) 20 MG tablet Take 2 tablets (40 mg total) by mouth daily. 04/15/15   Antonietta Breach, PA-C   BP 113/80 mmHg  Pulse 71  Temp(Src) 98.5 F (36.9 C) (Oral)  Resp 20  Ht 5\' 11"  (1.803 m)  Wt 242 lb 7 oz (109.969 kg)  BMI 33.83 kg/m2  SpO2 99%   Physical Exam  Constitutional: He is oriented to person, place, and time. He appears well-developed and well-nourished. No distress.  Nontoxic/nonseptic appearing. Patient is pleasant  HENT:  Head: Normocephalic and atraumatic.  Eyes: Conjunctivae and EOM are normal. No scleral icterus.  Neck: Normal range of motion.  No JVD  Cardiovascular: Normal rate.  An irregularly irregular rhythm present.  Pulses:      Radial pulses are 2+ on the right side, and 2+ on the left side.       Dorsalis pedis pulses are 2+ on the right side, and 2+ on the left side.       Posterior tibial pulses are 2+ on the right side, and 2+ on  the left side.  Pulmonary/Chest: Effort normal and breath sounds normal. No respiratory distress. He has no wheezes. He has no rales.  Lungs CTAB  Musculoskeletal: He exhibits tenderness.  Swelling with mild pitting edema to dorsum of R hand. Mild TTP to the 2nd MCP joint. Decreased AROM of R hand and digits secondary to edema. Mild erythema without heat to touch. No red linear streaking. There is mild swelling to the L 1st MCP joint without significant TTP as well as R 1st MTP joint with tenderness. No calf swelling or  tenderness.  Neurological: He is alert and oriented to person, place, and time. He exhibits normal muscle tone. Coordination normal.  GCS 15. Speech is goal oriented. Sensation to light touch intact in all extremities. Patient moves extremities without ataxia.  Skin: Skin is warm and dry. No rash noted. He is not diaphoretic. No erythema. No pallor.  Psychiatric: He has a normal mood and affect. His behavior is normal.  Nursing note and vitals reviewed.   ED Course  Procedures (including critical care time) Labs Review Labs Reviewed  CBC WITH DIFFERENTIAL/PLATELET - Abnormal; Notable for the following:    Platelets 139 (*)    Monocytes Relative 16 (*)    Monocytes Absolute 1.1 (*)    All other components within normal limits  COMPREHENSIVE METABOLIC PANEL - Abnormal; Notable for the following:    Sodium 134 (*)    Albumin 3.3 (*)    Total Bilirubin 1.4 (*)    GFR calc non Af Amer 62 (*)    GFR calc Af Amer 72 (*)    All other components within normal limits  PROTIME-INR - Abnormal; Notable for the following:    Prothrombin Time 37.8 (*)    INR 3.80 (*)    All other components within normal limits  URIC ACID    Imaging Review Dg Hand Complete Left  04/15/2015   CLINICAL DATA:  Severe pain and swelling involving multiple joints in the left hand which began 2 days ago and has progressively worsened. No known injuries.  EXAM: LEFT HAND - COMPLETE 3+ VIEW  COMPARISON:  None.  FINDINGS: No evidence of acute, subacute or healed fractures. Joint space narrowing involving the IP joints of multiple fingers, worst involving the DIP joint of the index finger. Joint space narrowing involving all of the MCP joints. No visible erosions. Mild osseous demineralization suspected.  Severe radiocarpal joint space narrowing in the wrist. 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  IMPRESSION: Osteoarthritis involving the hand and wrist. Incidental note of partial coalition of the capitate and  hamate bones in the wrist.   Electronically Signed   By: Evangeline Dakin M.D.   On: 04/15/2015 22:08   Dg Hand Complete Right  04/15/2015   CLINICAL DATA:  Severe right hand swelling and joint pain.  EXAM: RIGHT HAND - COMPLETE 3+ VIEW  COMPARISON:  None.  FINDINGS: There is diffuse soft tissue swelling. There is diffuse slight narrowing of the metacarpal phalangeal joints. There is also narrowing of the radiocarpal joint with slight arthritic changes between the carpal bones. There is also diffuse narrowing of the interphalangeal joints of all of the digits. No periarticular demineralization or subluxation.  IMPRESSION: No acute osseous abnormality. Diffuse arthritic changes, nonspecific. Diffuse prominent soft tissue swelling.   Electronically Signed   By: Lorriane Shire M.D.   On: 04/15/2015 22:08   Dg Foot 2 Views Right  04/15/2015   CLINICAL DATA:  Right foot pain  for 3 days, no trauma  EXAM: RIGHT FOOT - 2 VIEW  COMPARISON:  None.  FINDINGS: There is no evidence of fracture or dislocation. There is no evidence of arthropathy or other focal bone abnormality. Soft tissues are unremarkable. Vascular calcifications are identified. Plantar calcaneal spurring.  IMPRESSION: Negative.   Electronically Signed   By: Conchita Paris M.D.   On: 04/15/2015 22:04     EKG Interpretation None      MDM   Final diagnoses:  Joint pain  Polyarticular arthritis    69 year old male presents to the emergency department for further evaluation of right hand and thumb swelling, left thumb swelling, and swelling of the right great toe. Symptoms have been worsening over the past 3 days. Patient is neurovascularly intact. He is afebrile. Doubt septic joint given polyarticular nature of symptoms and full passive range of motion. No significant erythema or heat to touch with joint swelling. X-rays today show arthritic changes. Suspect inflammatory arthritis versus gouty arthritis as cause of symptoms today. Doubt  spontaneous hemarthrosis.  Patient to be treated with prednisone course; unable to take NSAIDs secondary to chronic coumadin use. Will also give short course of Norco. PCP f/u advised in 3 days for a recheck of symptoms. Patient given right wrist brace for comfort and return precautions provided. Patient discharged in good condition with no unaddressed concerns. Patient seen also by Dr. Jeneen Rinks who is in agreement with this workup, assessment, management plan, and patient's stability for discharge.   Filed Vitals:   04/15/15 1743 04/15/15 2100 04/15/15 2246  BP: 135/99 134/98 113/80  Pulse: 70 82 71  Temp: 98.4 F (36.9 C)  98.5 F (36.9 C)  TempSrc: Oral  Oral  Resp: 20  20  Height: 5\' 11"  (1.803 m)    Weight: 242 lb 7 oz (109.969 kg)    SpO2: 100% 99% 99%       Antonietta Breach, PA-C 04/15/15 2313  Tanna Furry, MD 04/16/15 1718

## 2015-04-15 NOTE — ED Provider Notes (Signed)
CSN: [id number]     Arrival date & time 04/15/15  1542 History   First MD Initiated Contact with Patient 04/15/15 1645     Chief Complaint  Patient presents with  . Edema   (Consider location/radiation/quality/duration/timing/severity/associated sxs/prior Treatment) HPI     69 year old male presents complaining of swelling in his right hand, left thumb, and right first MTP. This is been present for about 2 days, gradually worsening. The pain is now severe. He cannot bend his right hand at all. He is never had this before and denies any other systemic symptoms. His medical history includes HIV fibrillation on chronic Coumadin as well as factor VII deficiency. Also coronary artery disease, CHF, EF 15-20%, hypertension, ICD, stroke, seizure disorder   Past Medical History  Diagnosis Date  . CAD (coronary artery disease)   . Chronic systolic heart failure   . Ischemic cardiomyopathy     EF 15 to 20% by TTE and TEE in 09/2012.  Severe LV dysfunction  . HTN (hypertension)   . Hyperlipidemia   . Obesity     BMI 31 in 09/2012  . Atrial fibrillation   09/22/2012  . implantable cardiac defibrillator-Biotronik     Device Implanted 2006; s/p gen change 03/2011 : bleeding persistent with pocket erosion and infection; explant and reimplant  06/2011  . Stroke   . 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 History of MRSA infection 05/2011  . Seizure disorder latest 09/30/2012  . 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 Seizures    Past Surgical History  Procedure Laterality Date  . Coronary artery bypass graft    . Tee without cardioversion  09/24/2012    Procedure: TRANSESOPHAGEAL ECHOCARDIOGRAM (TEE);  Surgeon: [name], MD;  Location: [facility];  Service: Cardiovascular;  Laterality: N/A;  dave/anesth, dl, cindy/echo   . Cardioversion  09/24/2012    Procedure: CARDIOVERSION;  Surgeon: [name], MD;  Location: [facility] ENDOSCOPY;  Service: Cardiovascular;  Laterality: N/A;  . Icd     No family  history on file. History  Substance Use Topics  . Smoking status: Former Smoker    Types: Pipe    Quit date: 09/21/2008  . Smokeless tobacco: Former Systems developer    Quit date: 09/21/2008  . Alcohol Use: No    Review of Systems  Musculoskeletal:       Swelling, see history of present illness  All other systems reviewed and are negative.   Allergies  Review of patient's allergies indicates no known allergies.  Home Medications   Prior to Admission medications   Medication Sig Start Date End Date Taking? Authorizing Provider  carvedilol (COREG) 25 MG tablet Take 1 tablet (25 mg total) by mouth 2 (two) times daily. 07/29/14   [name], MD  furosemide (LASIX) 40 MG tablet Take 1 tablet (40 mg total) by mouth daily. 01/05/15   [name], MD  lisinopril (PRINIVIL,ZESTRIL) 5 MG tablet Take 1 tablet (5 mg total) by mouth daily. 03/29/15   [name], MD  phenytoin (DILANTIN) 100 MG ER capsule Take 100 mg by mouth daily.    Historical Provider, MD  warfarin (COUMADIN) 5 MG tablet TAKE AS DIRECTED BY COUMADIN CLINIC 03/30/15   [name], MD   Pulse 72  Temp(Src) 98.4 F (36.9 C) (Oral)  Resp 12  SpO2 100% Physical Exam  Constitutional: He is oriented to person, place, and time. He appears well-developed and well-nourished. No distress.  HENT:  Head: Normocephalic.  Pulmonary/Chest: Effort  normal. No respiratory distress.  Musculoskeletal:  Erythema, swelling, pitting edema of the right wrist and hand through the ends of the fingers with extremely limited range of motion and tenderness.similar condition in the left thumb and first metacarpal, and the first MTP  Neurological: He is alert and oriented to person, place, and time. Coordination normal.  Skin: Skin is warm and dry. No rash noted. He is not diaphoretic.  Psychiatric: He has a normal mood and affect. Judgment normal.  Nursing note and vitals reviewed.   ED Course  Procedures (including critical care time) Labs  Review Labs Reviewed - No data to display  Imaging Review No results found.   MDM   1. Polyarthritis    asymmetric polyarthritis. In the setting of factor VII deficiency and also on Coumadin, there is concern for spontaneous hemarthrosis, inflammatory arthritis. He needs workup for this problem today in the emergency department. Transferred via [place] Jathan Balling, PA-C 04/15/15 1655

## 2015-04-15 NOTE — Discharge Instructions (Signed)
Recommend wearing a wrist brace for stability. Take Prednisone as prescribed and Norco for pain as needed. Skip your dose of Coumadin/Warfarin tomorrow (Saturday). Follow up with your doctor for a recheck of symptoms on Monday.  Arthritis Arthritis is inflammation of a joint. This usually means pain, redness, warmth or swelling are present. One or more joints may be involved. There are a number of types of arthritis. Your caregiver may not be able to tell what type of arthritis you have right away. CAUSES  The most common cause of arthritis is the wear and tear on the joint (osteoarthritis). This causes damage to the cartilage, which can break down over time. The knees, hips, back and neck are most often affected by this type of arthritis. Other types of arthritis and common causes of joint pain include:  Sprains and other injuries near the joint. Sometimes minor sprains and injuries cause pain and swelling that develop hours later.  Rheumatoid arthritis. This affects hands, feet and knees. It usually affects both sides of your body at the same time. It is often associated with chronic ailments, fever, weight loss and general weakness.  Crystal arthritis. Gout and pseudo gout can cause occasional acute severe pain, redness and swelling in the foot, ankle, or knee.  Infectious arthritis. Bacteria can get into a joint through a break in overlying skin. This can cause infection of the joint. Bacteria and viruses can also spread through the blood and affect your joints.  Drug, infectious and allergy reactions. Sometimes joints can become mildly painful and slightly swollen with these types of illnesses. SYMPTOMS   Pain is the main symptom.  Your joint or joints can also be red, swollen and warm or hot to the touch.  You may have a fever with certain types of arthritis, or even feel overall ill.  The joint with arthritis will hurt with movement. Stiffness is present with some types of  arthritis. DIAGNOSIS  Your caregiver will suspect arthritis based on your description of your symptoms and on your exam. Testing may be needed to find the type of arthritis:  Blood and sometimes urine tests.  X-ray tests and sometimes CT or MRI scans.  Removal of fluid from the joint (arthrocentesis) is done to check for bacteria, crystals or other causes. Your caregiver (or a specialist) will numb the area over the joint with a local anesthetic, and use a needle to remove joint fluid for examination. This procedure is only minimally uncomfortable.  Even with these tests, your caregiver may not be able to tell what kind of arthritis you have. Consultation with a specialist (rheumatologist) may be helpful. TREATMENT  Your caregiver will discuss with you treatment specific to your type of arthritis. If the specific type cannot be determined, then the following general recommendations may apply. Treatment of severe joint pain includes:  Rest.  Elevation.  Anti-inflammatory medication (for example, ibuprofen) may be prescribed. Avoiding activities that cause increased pain.  Only take over-the-counter or prescription medicines for pain and discomfort as recommended by your caregiver.  Cold packs over an inflamed joint may be used for 10 to 15 minutes every hour. Hot packs sometimes feel better, but do not use overnight. Do not use hot packs if you are diabetic without your caregiver's permission.  A cortisone shot into arthritic joints may help reduce pain and swelling.  Any acute arthritis that gets worse over the next 1 to 2 days needs to be looked at to be sure there is no joint  infection. Long-term arthritis treatment involves modifying activities and lifestyle to reduce joint stress jarring. This can include weight loss. Also, exercise is needed to nourish the joint cartilage and remove waste. This helps keep the muscles around the joint strong. HOME CARE INSTRUCTIONS   Do not take  aspirin to relieve pain if gout is suspected. This elevates uric acid levels.  Only take over-the-counter or prescription medicines for pain, discomfort or fever as directed by your caregiver.  Rest the joint as much as possible.  If your joint is swollen, keep it elevated.  Use crutches if the painful joint is in your leg.  Drinking plenty of fluids may help for certain types of arthritis.  Follow your caregiver's dietary instructions.  Try low-impact exercise such as:  Swimming.  Water aerobics.  Biking.  Walking.  Morning stiffness is often relieved by a warm shower.  Put your joints through regular range-of-motion. SEEK MEDICAL CARE IF:   You do not feel better in 24 hours or are getting worse.  You have side effects to medications, or are not getting better with treatment. SEEK IMMEDIATE MEDICAL CARE IF:   You have a fever.  You develop severe joint pain, swelling or redness.  Many joints are involved and become painful and swollen.  There is severe back pain and/or leg weakness.  You have loss of bowel or bladder control. Document Released: 01/17/2005 Document Revised: 03/03/2012 Document Reviewed: 02/02/2009 [facility] Patient Information 2015 [place], [place]. This information is not intended to replace advice given to you by your health care provider. Make sure you discuss any questions you have with your health care provider.

## 2015-04-15 NOTE — ED Provider Notes (Signed)
Patient marked for merge with MRN [id number]  Antonietta Breach, PA-C 04/15/15 2045  Tanna Furry, MD 04/16/15 [phone]

## 2015-04-15 NOTE — ED Notes (Signed)
Patient c/o spontaneous swelling in his right hand and left index finger and big toe x 2 days. Patient reports it is not painful. Patient is in NAD.

## 2015-04-22 ENCOUNTER — Encounter: Payer: Self-pay | Admitting: Cardiology

## 2015-04-29 ENCOUNTER — Ambulatory Visit (INDEPENDENT_AMBULATORY_CARE_PROVIDER_SITE_OTHER): Payer: Medicare Other

## 2015-04-29 ENCOUNTER — Encounter: Payer: Self-pay | Admitting: Internal Medicine

## 2015-04-29 DIAGNOSIS — Z5181 Encounter for therapeutic drug level monitoring: Secondary | ICD-10-CM | POA: Diagnosis not present

## 2015-04-29 DIAGNOSIS — Z7901 Long term (current) use of anticoagulants: Secondary | ICD-10-CM | POA: Diagnosis not present

## 2015-04-29 DIAGNOSIS — I4891 Unspecified atrial fibrillation: Secondary | ICD-10-CM | POA: Diagnosis not present

## 2015-04-29 LAB — POCT INR: INR: 3.1

## 2015-05-02 ENCOUNTER — Encounter: Payer: Self-pay | Admitting: Internal Medicine

## 2015-05-04 NOTE — Progress Notes (Signed)
This encounter was created in error - please disregard.

## 2015-05-09 DIAGNOSIS — M069 Rheumatoid arthritis, unspecified: Secondary | ICD-10-CM | POA: Diagnosis not present

## 2015-05-11 ENCOUNTER — Telehealth: Payer: Self-pay | Admitting: Internal Medicine

## 2015-05-11 NOTE — Telephone Encounter (Signed)
New Message  Pt calling to speak w/ Coumadin about new medications he is taking. Please call back and dsicuss.

## 2015-05-11 NOTE — Telephone Encounter (Signed)
Returned called to patient and he states he started methylprednisolone 4mg  pack. He will start taking the medication on 05/12/15. The dose is as follows per patient: 6 tabs 1st day 5 tabs 2nd day 4 tabs 3rd day 3 tabs 4th day 2 tabs 5th day 1 tab 6th day Advised patient that the medication can potentially interfere with Warfarin/Coumadin.  Appointment made for Monday 05/16/15.

## 2015-05-16 ENCOUNTER — Ambulatory Visit (INDEPENDENT_AMBULATORY_CARE_PROVIDER_SITE_OTHER): Payer: Medicare Other | Admitting: *Deleted

## 2015-05-16 DIAGNOSIS — Z5181 Encounter for therapeutic drug level monitoring: Secondary | ICD-10-CM

## 2015-05-16 DIAGNOSIS — Z7901 Long term (current) use of anticoagulants: Secondary | ICD-10-CM

## 2015-05-16 DIAGNOSIS — I4891 Unspecified atrial fibrillation: Secondary | ICD-10-CM | POA: Diagnosis not present

## 2015-05-16 LAB — POCT INR: INR: 4.4

## 2015-05-30 ENCOUNTER — Ambulatory Visit (INDEPENDENT_AMBULATORY_CARE_PROVIDER_SITE_OTHER): Payer: Medicare Other | Admitting: *Deleted

## 2015-05-30 DIAGNOSIS — Z7901 Long term (current) use of anticoagulants: Secondary | ICD-10-CM | POA: Diagnosis not present

## 2015-05-30 DIAGNOSIS — I4891 Unspecified atrial fibrillation: Secondary | ICD-10-CM

## 2015-05-30 DIAGNOSIS — Z5181 Encounter for therapeutic drug level monitoring: Secondary | ICD-10-CM | POA: Diagnosis not present

## 2015-05-30 LAB — POCT INR: INR: 3.6

## 2015-05-31 DIAGNOSIS — H00014 Hordeolum externum left upper eyelid: Secondary | ICD-10-CM | POA: Diagnosis not present

## 2015-06-01 DIAGNOSIS — M109 Gout, unspecified: Secondary | ICD-10-CM | POA: Diagnosis not present

## 2015-06-02 ENCOUNTER — Telehealth: Payer: Self-pay | Admitting: Internal Medicine

## 2015-06-02 NOTE — Telephone Encounter (Signed)
New message      Pt is on warfarin.  He has a cyst and was prescribed aziphromycin 250mg ----take 2 tablets first day then 1 tablet daily for 4 days.  Will he need his coumadin adjusted?

## 2015-06-02 NOTE — Telephone Encounter (Signed)
Telephoned pt discussed ABX- Azithromycin 250mg s(Z-Pk) informed him that no adjustments need to be made and call us if any other med is added.

## 2015-06-13 ENCOUNTER — Ambulatory Visit (INDEPENDENT_AMBULATORY_CARE_PROVIDER_SITE_OTHER): Payer: Medicare Other | Admitting: *Deleted

## 2015-06-13 DIAGNOSIS — I4891 Unspecified atrial fibrillation: Secondary | ICD-10-CM

## 2015-06-13 DIAGNOSIS — Z7901 Long term (current) use of anticoagulants: Secondary | ICD-10-CM | POA: Diagnosis not present

## 2015-06-13 DIAGNOSIS — Z5181 Encounter for therapeutic drug level monitoring: Secondary | ICD-10-CM | POA: Diagnosis not present

## 2015-06-13 LAB — POCT INR: INR: 2.9

## 2015-07-08 ENCOUNTER — Encounter: Payer: Self-pay | Admitting: Internal Medicine

## 2015-07-08 ENCOUNTER — Ambulatory Visit (INDEPENDENT_AMBULATORY_CARE_PROVIDER_SITE_OTHER): Payer: Medicare Other | Admitting: *Deleted

## 2015-07-08 ENCOUNTER — Ambulatory Visit (INDEPENDENT_AMBULATORY_CARE_PROVIDER_SITE_OTHER): Payer: Medicare Other | Admitting: Internal Medicine

## 2015-07-08 VITALS — BP 110/78 | HR 62 | Ht 71.0 in | Wt 248.0 lb

## 2015-07-08 DIAGNOSIS — I4891 Unspecified atrial fibrillation: Secondary | ICD-10-CM

## 2015-07-08 DIAGNOSIS — I2510 Atherosclerotic heart disease of native coronary artery without angina pectoris: Secondary | ICD-10-CM | POA: Diagnosis not present

## 2015-07-08 DIAGNOSIS — Z95810 Presence of automatic (implantable) cardiac defibrillator: Secondary | ICD-10-CM

## 2015-07-08 DIAGNOSIS — I2583 Coronary atherosclerosis due to lipid rich plaque: Secondary | ICD-10-CM

## 2015-07-08 DIAGNOSIS — I5022 Chronic systolic (congestive) heart failure: Secondary | ICD-10-CM

## 2015-07-08 DIAGNOSIS — Z7901 Long term (current) use of anticoagulants: Secondary | ICD-10-CM | POA: Diagnosis not present

## 2015-07-08 DIAGNOSIS — Z5181 Encounter for therapeutic drug level monitoring: Secondary | ICD-10-CM

## 2015-07-08 DIAGNOSIS — I255 Ischemic cardiomyopathy: Secondary | ICD-10-CM

## 2015-07-08 LAB — CUP PACEART INCLINIC DEVICE CHECK
Battery Voltage: 3.1 V
Date Time Interrogation Session: 20160715101455
HighPow Impedance: 59 Ohm
Lead Channel Impedance Value: 482 Ohm
Lead Channel Pacing Threshold Amplitude: 0.5 V
Lead Channel Sensing Intrinsic Amplitude: 12.1 mV
Lead Channel Setting Pacing Amplitude: 2 V
Lead Channel Setting Sensing Sensitivity: 0.8 mV
MDC IDC MSMT LEADCHNL RV PACING THRESHOLD PULSEWIDTH: 0.4 ms
MDC IDC PG SERIAL: 60613417
MDC IDC SET LEADCHNL RV PACING PULSEWIDTH: 0.4 ms
Zone Setting Detection Interval: 260 ms
Zone Setting Detection Interval: 400 ms

## 2015-07-08 LAB — POCT INR: INR: 1.9

## 2015-07-08 MED ORDER — CARVEDILOL 25 MG PO TABS: 25.0000 mg | ORAL_TABLET | Freq: Two times a day (BID) | ORAL | Status: DC

## 2015-07-08 MED ORDER — FUROSEMIDE 40 MG PO TABS: 80.0000 mg | ORAL_TABLET | Freq: Every day | ORAL | Status: DC

## 2015-07-08 NOTE — Assessment & Plan Note (Signed)
His symptoms are class 2. He has some peripheral edema. I have asked him to reduce his sodium intake and increase his lasix to 80 mg daily.

## 2015-07-08 NOTE — Assessment & Plan Note (Signed)
He denies anginal symptoms. No change in meds.  

## 2015-07-08 NOTE — Assessment & Plan Note (Signed)
His Biotronik ICD is working normally. Will recheck in several months.

## 2015-07-08 NOTE — Progress Notes (Signed)
HPI Louis Ford returns today for ICD followup. He is a pleasant 69 yo man, who has a h/o HTN, PAF and an ICM with chronic class 2 CHF. He has had no ICD shock. No chest pain. He has had some problems with increased peripheral edema. He admits to sodium indiscretion.  No Known Allergies   Current Outpatient Prescriptions  Medication Sig Dispense Refill  . carvedilol (COREG) 25 MG tablet Take 1 tablet (25 mg total) by mouth 2 (two) times daily. 180 tablet 3  . furosemide (LASIX) 40 MG tablet Take 1 tablet (40 mg total) by mouth daily. 30 tablet 5  . lisinopril (PRINIVIL,ZESTRIL) 5 MG tablet Take 1 tablet (5 mg total) by mouth daily. 90 tablet 1  . phenytoin (DILANTIN) 100 MG ER capsule Take 100 mg by mouth daily.    [name] warfarin (COUMADIN) 5 MG tablet TAKE AS DIRECTED BY COUMADIN CLINIC 45 tablet 1   No current facility-administered medications for this visit.     Past Medical History  Diagnosis Date  . CAD (coronary artery disease)   . Chronic systolic heart failure   . Ischemic cardiomyopathy     EF 15 to 20% by TTE and TEE in 09/2012.  Severe LV dysfunction  . HTN (hypertension)   . Hyperlipidemia   . Obesity     BMI 31 in 09/2012  . Atrial fibrillation   09/22/2012  . implantable cardiac defibrillator-Biotronik     Device Implanted 2006; s/p gen change 03/2011 : bleeding persistent with pocket erosion and infection; explant and reimplant  06/2011  . Stroke   . 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 History of MRSA infection 05/2011  . Seizure disorder latest 09/30/2012  . 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 Seizures     ROS:   All systems reviewed and negative except as noted in the HPI.   Past Surgical History  Procedure Laterality Date  . Coronary artery bypass graft    . Tee without cardioversion  09/24/2012    Procedure: TRANSESOPHAGEAL ECHOCARDIOGRAM (TEE);  Surgeon: [name], MD;  Location: [place];  Service: Cardiovascular;  Laterality: N/A;   dave/anesth, dl, cindy/echo   . Cardioversion  09/24/2012    Procedure: CARDIOVERSION;  Surgeon: [name], MD;  Location: [facility] ENDOSCOPY;  Service: Cardiovascular;  Laterality: N/A;  . Icd       Family History  Problem Relation Age of Onset  . Arthritis Mother   . Heart disease Mother   . Other Father     smoker     History   Social History  . Marital Status: Single    Spouse Name: N/A  . Number of Children: N/A  . Years of Education: N/A   Occupational History  . Not on file.   Social History Main Topics  . Smoking status: Former Smoker    Types: Pipe    Quit date: 09/21/2008  . Smokeless tobacco: Former Systems developer    Quit date: 09/21/2008  . Alcohol Use: No  . Drug Use: No  . Sexual Activity: No   Other Topics Concern  . Not on file   Social History Narrative     BP 110/78 mmHg  Pulse 62  Ht 5\' 11"  (1.803 m)  Wt 248 lb (112.492 kg)  BMI 34.60 kg/m2  Physical Exam:  Stable appearing NAD HEENT: Unremarkable Neck:  No JVD, no thyromegally Back:  No CVA tenderness Lungs:  Clear with no wheezes HEART:  Regular  rate rhythm, no murmurs, no rubs, no clicks Abd:  soft, positive bowel sounds, no organomegally, no rebound, no guarding Ext:  2 plus pulses, 2+ peripheral edema, no cyanosis, no clubbing Skin:  No rashes no nodules Neuro:  CN II through XII intact, motor grossly intact   DEVICE  Normal device function.  See PaceArt for details.   Assess/Plan:

## 2015-07-08 NOTE — Assessment & Plan Note (Signed)
He will continue his systemic anti-coagulation.

## 2015-07-08 NOTE — Patient Instructions (Signed)
Medication Instructions:  Your physician has recommended you make the following change in your medication:  1) Increase Furosemide to 80 mg daily    Labwork: None ordered  Testing/Procedures: None ordered  Follow-Up: Your physician wants you to follow-up in: 12 months with Dr Knox Saliva will receive a reminder letter in the mail two months in advance. If you don't receive a letter, please call our office to schedule the follow-up appointment.  Remote monitoring is used to monitor your Pacemaker or ICD from home. This monitoring reduces the number of office visits required to check your device to one time per year. It allows [place] to keep an eye on the functioning of your device to ensure it is working properly. You are scheduled for a device check from home on 10/10/15. You may send your transmission at any time that day. If you have a wireless device, the transmission will be sent automatically. After your physician reviews your transmission, you will receive a postcard with your next transmission date.    Any Other Special Instructions Will Be Listed Below (If Applicable).

## 2015-07-13 ENCOUNTER — Encounter: Payer: Self-pay | Admitting: Internal Medicine

## 2015-07-29 ENCOUNTER — Ambulatory Visit (INDEPENDENT_AMBULATORY_CARE_PROVIDER_SITE_OTHER): Payer: Medicare Other | Admitting: Pharmacist

## 2015-07-29 DIAGNOSIS — I4891 Unspecified atrial fibrillation: Secondary | ICD-10-CM | POA: Diagnosis not present

## 2015-07-29 DIAGNOSIS — Z7901 Long term (current) use of anticoagulants: Secondary | ICD-10-CM

## 2015-07-29 DIAGNOSIS — Z5181 Encounter for therapeutic drug level monitoring: Secondary | ICD-10-CM

## 2015-07-29 LAB — POCT INR: INR: 3

## 2015-07-30 ENCOUNTER — Other Ambulatory Visit: Payer: Self-pay | Admitting: Internal Medicine

## 2015-08-01 ENCOUNTER — Telehealth: Payer: Self-pay | Admitting: Internal Medicine

## 2015-08-01 NOTE — Telephone Encounter (Signed)
New message      Calling about a possible drug interaction between dilantin and warfarin.  Please call to ok to fill warfarin

## 2015-08-01 NOTE — Telephone Encounter (Signed)
Pt has taken both medication for several years.  Spoke with [employer] and gave ok to fill warfarin.

## 2015-08-01 NOTE — Telephone Encounter (Signed)
Left message to call me back.

## 2015-08-01 NOTE — Telephone Encounter (Signed)
New message    Pt was in the ofc on Friday for check up  Pt states some swelling has gone down but not that much since Friday Please call to discuss

## 2015-08-08 NOTE — Telephone Encounter (Signed)
F/u        Pt returning [facility] phone call.

## 2015-08-08 NOTE — Telephone Encounter (Signed)
Spoke with patient and the swelling is down in his left foot.  He took extra fluid pills and has been soaking his foot in epsom salt and feels it has helped.  He will call back if he feels the swelling worsens or he needs to be seen

## 2015-08-17 DIAGNOSIS — I10 Essential (primary) hypertension: Secondary | ICD-10-CM | POA: Diagnosis not present

## 2015-08-19 ENCOUNTER — Ambulatory Visit (INDEPENDENT_AMBULATORY_CARE_PROVIDER_SITE_OTHER): Payer: Medicare Other | Admitting: *Deleted

## 2015-08-19 DIAGNOSIS — Z5181 Encounter for therapeutic drug level monitoring: Secondary | ICD-10-CM | POA: Diagnosis not present

## 2015-08-19 DIAGNOSIS — I4891 Unspecified atrial fibrillation: Secondary | ICD-10-CM | POA: Diagnosis not present

## 2015-08-19 DIAGNOSIS — Z7901 Long term (current) use of anticoagulants: Secondary | ICD-10-CM | POA: Diagnosis not present

## 2015-08-19 LAB — POCT INR: INR: 4.7

## 2015-09-07 ENCOUNTER — Ambulatory Visit (INDEPENDENT_AMBULATORY_CARE_PROVIDER_SITE_OTHER): Payer: Medicare Other | Admitting: *Deleted

## 2015-09-07 DIAGNOSIS — Z5181 Encounter for therapeutic drug level monitoring: Secondary | ICD-10-CM

## 2015-09-07 DIAGNOSIS — I4891 Unspecified atrial fibrillation: Secondary | ICD-10-CM | POA: Diagnosis not present

## 2015-09-07 DIAGNOSIS — Z7901 Long term (current) use of anticoagulants: Secondary | ICD-10-CM | POA: Diagnosis not present

## 2015-09-07 LAB — POCT INR: INR: 2.5

## 2015-09-07 MED ORDER — WARFARIN SODIUM 5 MG PO TABS: ORAL_TABLET | ORAL | Status: DC

## 2015-09-26 ENCOUNTER — Encounter (HOSPITAL_COMMUNITY): Payer: Self-pay

## 2015-09-26 ENCOUNTER — Emergency Department (HOSPITAL_COMMUNITY)
Admission: EM | Admit: 2015-09-26 | Discharge: 2015-09-26 | Disposition: A | Discharge status: Home / Self Care | Payer: Medicare Other | Attending: Emergency Medicine | Admitting: Emergency Medicine

## 2015-09-26 ENCOUNTER — Emergency Department (HOSPITAL_COMMUNITY): Payer: Medicare Other

## 2015-09-26 DIAGNOSIS — I2510 Atherosclerotic heart disease of native coronary artery without angina pectoris: Secondary | ICD-10-CM | POA: Insufficient documentation

## 2015-09-26 DIAGNOSIS — Y9289 Other specified places as the place of occurrence of the external cause: Secondary | ICD-10-CM | POA: Diagnosis not present

## 2015-09-26 DIAGNOSIS — I4891 Unspecified atrial fibrillation: Secondary | ICD-10-CM | POA: Insufficient documentation

## 2015-09-26 DIAGNOSIS — S0081XA Abrasion of other part of head, initial encounter: Secondary | ICD-10-CM | POA: Insufficient documentation

## 2015-09-26 DIAGNOSIS — Z8639 Personal history of other endocrine, nutritional and metabolic disease: Secondary | ICD-10-CM | POA: Insufficient documentation

## 2015-09-26 DIAGNOSIS — I10 Essential (primary) hypertension: Secondary | ICD-10-CM | POA: Diagnosis not present

## 2015-09-26 DIAGNOSIS — G40909 Epilepsy, unspecified, not intractable, without status epilepticus: Secondary | ICD-10-CM | POA: Insufficient documentation

## 2015-09-26 DIAGNOSIS — Z95810 Presence of automatic (implantable) cardiac defibrillator: Secondary | ICD-10-CM | POA: Diagnosis not present

## 2015-09-26 DIAGNOSIS — R55 Syncope and collapse: Secondary | ICD-10-CM | POA: Diagnosis not present

## 2015-09-26 DIAGNOSIS — W1839XA Other fall on same level, initial encounter: Secondary | ICD-10-CM | POA: Diagnosis not present

## 2015-09-26 DIAGNOSIS — Z862 Personal history of diseases of the blood and blood-forming organs and certain disorders involving the immune mechanism: Secondary | ICD-10-CM | POA: Diagnosis not present

## 2015-09-26 DIAGNOSIS — Y998 Other external cause status: Secondary | ICD-10-CM | POA: Diagnosis not present

## 2015-09-26 DIAGNOSIS — R0602 Shortness of breath: Secondary | ICD-10-CM | POA: Diagnosis not present

## 2015-09-26 DIAGNOSIS — R569 Unspecified convulsions: Secondary | ICD-10-CM | POA: Diagnosis present

## 2015-09-26 DIAGNOSIS — Y9389 Activity, other specified: Secondary | ICD-10-CM | POA: Insufficient documentation

## 2015-09-26 DIAGNOSIS — I5022 Chronic systolic (congestive) heart failure: Secondary | ICD-10-CM | POA: Diagnosis not present

## 2015-09-26 DIAGNOSIS — E669 Obesity, unspecified: Secondary | ICD-10-CM | POA: Diagnosis not present

## 2015-09-26 DIAGNOSIS — G40919 Epilepsy, unspecified, intractable, without status epilepticus: Secondary | ICD-10-CM

## 2015-09-26 LAB — CBC WITH DIFFERENTIAL/PLATELET
BASOS ABS: 0 10*3/uL (ref 0.0–0.1)
Basophils Relative: 0 %
EOS PCT: 9 %
Eosinophils Absolute: 0.5 10*3/uL (ref 0.0–0.7)
HEMATOCRIT: 45.6 % (ref 39.0–52.0)
Hemoglobin: 14.8 g/dL (ref 13.0–17.0)
LYMPHS ABS: 1.2 10*3/uL (ref 0.7–4.0)
LYMPHS PCT: 21 %
MCH: 28.7 pg (ref 26.0–34.0)
MCHC: 32.5 g/dL (ref 30.0–36.0)
MCV: 88.5 fL (ref 78.0–100.0)
MONO ABS: 0.6 10*3/uL (ref 0.1–1.0)
Monocytes Relative: 11 %
NEUTROS ABS: 3.4 10*3/uL (ref 1.7–7.7)
Neutrophils Relative %: 59 %
Platelets: 110 10*3/uL — ABNORMAL LOW (ref 150–400)
RBC: 5.15 MIL/uL (ref 4.22–5.81)
RDW: 14.8 % (ref 11.5–15.5)
WBC: 5.8 10*3/uL (ref 4.0–10.5)

## 2015-09-26 LAB — COMPREHENSIVE METABOLIC PANEL
ALBUMIN: 3.5 g/dL (ref 3.5–5.0)
ALT: 30 U/L (ref 17–63)
ANION GAP: 7 (ref 5–15)
AST: 45 U/L — AB (ref 15–41)
Alkaline Phosphatase: 95 U/L (ref 38–126)
BILIRUBIN TOTAL: 0.5 mg/dL (ref 0.3–1.2)
BUN: 24 mg/dL — AB (ref 6–20)
CHLORIDE: 106 mmol/L (ref 101–111)
CO2: 23 mmol/L (ref 22–32)
Calcium: 8.6 mg/dL — ABNORMAL LOW (ref 8.9–10.3)
Creatinine, Ser: 1.19 mg/dL (ref 0.61–1.24)
GFR calc Af Amer: >60 mL/min (ref >60)
GFR calc non Af Amer: >60 mL/min (ref >60)
GLUCOSE: 98 mg/dL (ref 65–99)
POTASSIUM: 5.1 mmol/L (ref 3.5–5.1)
Sodium: 136 mmol/L (ref 135–145)
TOTAL PROTEIN: 6.8 g/dL (ref 6.5–8.1)

## 2015-09-26 LAB — PHENYTOIN LEVEL, TOTAL

## 2015-09-26 LAB — CBG MONITORING, ED: GLUCOSE-CAPILLARY: 96 mg/dL (ref 65–99)

## 2015-09-26 MED ORDER — SODIUM CHLORIDE 0.9 % IV SOLN
1000.0000 mg | Freq: Once | INTRAVENOUS | Status: AC
  Administered 2015-09-26: 1000 mg via INTRAVENOUS
  Filled 2015-09-26: qty 20

## 2015-09-26 MED ORDER — PHENYTOIN SODIUM EXTENDED 100 MG PO CAPS: 100.0000 mg | ORAL_CAPSULE | Freq: Three times a day (TID) | ORAL | Status: DC

## 2015-09-26 MED ORDER — ALBUTEROL SULFATE (2.5 MG/3ML) 0.083% IN NEBU: 5.0000 mg | INHALATION_SOLUTION | Freq: Once | RESPIRATORY_TRACT | Status: DC

## 2015-09-26 NOTE — ED Provider Notes (Signed)
CSN: [id number]     Arrival date & time 09/26/15  0202 History  By signing my name below, I, Louis Ford, attest that this documentation has been prepared under the direction and in the presence of Louis Speak, MD. Electronically Signed: [name], Education administrator. 09/26/2015. 2:35 AM.    Chief Complaint  Patient presents with  . Shortness of Breath  . Seizures    The history is provided by the patient. No language interpreter was used.     HPI Comments: Louis Ford. is a 69 y.o. male with past medical history of seizures, Afib, CAD who presents to the Emergency Department complaining of an unwitnessed seizure that occurred PTA. Patient states he was sitting down when he began shaking and fell over. He believes he had LOC since he is unsue of the exact events and was found by his roommate. He reports an associated episode of bowel incontinence. He states his last seizure was 1 year ago and currently takes Dilantin. He denies tongue or cheek biting.   Past Medical History  Diagnosis Date  . CAD (coronary artery disease)   . Chronic systolic heart failure ([place])   . Ischemic cardiomyopathy     EF 15 to 20% by TTE and TEE in 09/2012.  Severe LV dysfunction  . HTN (hypertension)   . Hyperlipidemia   . Obesity     BMI 31 in 09/2012  . Atrial fibrillation   09/22/2012  . implantable cardiac defibrillator-Biotronik     Device Implanted 2006; s/p gen change 03/2011 : bleeding persistent with pocket erosion and infection; explant and reimplant  06/2011  . Stroke ([place])   . Factor VII deficiency ([place]) 05/2011  . History of MRSA infection 05/2011  . Seizure disorder ([name]) latest 09/30/2012  . Factor VII deficiency ([name]) 10/07/2012  . Seizures [facility])    Past Surgical History  Procedure Laterality Date  . Coronary artery bypass graft    . Tee without cardioversion  09/24/2012    Procedure: TRANSESOPHAGEAL ECHOCARDIOGRAM (TEE);  Surgeon: [name], MD;  Location: [place];  Service:  Cardiovascular;  Laterality: N/A;  dave/anesth, dl, cindy/echo   . Cardioversion  09/24/2012    Procedure: CARDIOVERSION;  Surgeon: [name], MD;  Location: [facility] ENDOSCOPY;  Service: Cardiovascular;  Laterality: N/A;  . Icd     Family History  Problem Relation Age of Onset  . Arthritis Mother   . Heart disease Mother   . Other Father     smoker   Social History  Substance Use Topics  . Smoking status: Former Smoker    Types: Pipe    Quit date: 09/21/2008  . Smokeless tobacco: Former Systems developer    Quit date: 09/21/2008  . Alcohol Use: No    Review of Systems  Skin: Positive for wound (abrasion to forehead).  Neurological: Positive for seizures.  All other systems reviewed and are negative.     Allergies  Review of patient's allergies indicates no known allergies.  Home Medications   Prior to Admission medications   Medication Sig Start Date End Date Taking? Authorizing Provider  carvedilol (COREG) 25 MG tablet Take 1 tablet (25 mg total) by mouth 2 (two) times daily. 07/08/15   [name], MD  furosemide (LASIX) 40 MG tablet Take 2 tablets (80 mg total) by mouth daily. 07/08/15   [name], MD  HYDROcodone-acetaminophen (NORCO/VICODIN) 5-325 MG per tablet Take 1 tablet by mouth every 4 (four) hours as needed. 04/15/15  Antonietta Breach, PA-C  lisinopril (PRINIVIL,ZESTRIL) 5 MG tablet Take 1 tablet (5 mg total) by mouth daily. 03/29/15   [name], MD  phenytoin (DILANTIN) 100 MG ER capsule Take 100 mg by mouth daily.    Historical Provider, MD  predniSONE (DELTASONE) 20 MG tablet Take 2 tablets (40 mg total) by mouth daily. 04/15/15   Antonietta Breach, PA-C  warfarin (COUMADIN) 5 MG tablet TAKE AS DIRECTED BY  COUMADIN  CLINIC 09/07/15   [name], MD   BP 129/95 mmHg  Pulse 87  Temp(Src) 97.6 F (36.4 C) (Oral)  Resp 17  Ht 5\' 11"  (1.803 m)  Wt 250 lb (113.399 kg)  BMI 34.88 kg/m2  SpO2 100% Physical Exam  Constitutional: He is oriented to person, place, and  time. He appears well-developed and well-nourished.  HENT:  Head: Normocephalic and atraumatic.  Eyes: EOM are normal.  Neck: Normal range of motion.  Cardiovascular: Normal rate, regular rhythm, normal heart sounds and intact distal pulses.   Pulmonary/Chest: Effort normal and breath sounds normal. No respiratory distress.  Abdominal: Soft. He exhibits no distension. There is no tenderness.  Musculoskeletal: Normal range of motion.  Neurological: He is alert and oriented to person, place, and time.  Skin: Skin is warm and dry.  Abrasion to right upper forehead  Psychiatric: He has a normal mood and affect. Judgment normal.  Nursing note and vitals reviewed.   ED Course  Procedures (including critical care time)  DIAGNOSTIC STUDIES: Oxygen Saturation is 100% on RA, normal by my interpretation.    COORDINATION OF CARE: 2:41 AM Discussed treatment plan with pt at bedside and pt agreed to plan.   Labs Review Labs Reviewed  PHENYTOIN LEVEL, TOTAL  CBG MONITORING, ED    Imaging Review No results found. I have personally reviewed and evaluated these images and lab results as part of my medical decision-making.   EKG Interpretation   Date/Time:  Monday September 26 2015 02:27:39 EDT Ventricular Rate:  76 PR Interval:  138 QRS Duration: 98 QT Interval:  415 QTC Calculation: 467 R Axis:   -33 Text Interpretation:  Sinus rhythm Multiple premature complexes, vent   Left axis deviation Consider anterior infarct Minimal ST depression,  lateral leads Confirmed by Mozell Hardacre  MD, Alieu Finnigan ([id number]) on 09/26/2015 7:01:48  AM      MDM   Final diagnoses:  None    Patient is a 69 year old male who presents for evaluation of a seizure. He has a history of the same and is currently taking Dilantin. He was at church this evening when he experienced an episode of shaking and he fell to the floor. He arrives here neurologically intact with GCS of 15. He does not appear to be postictal.  His  workup reveals a subtherapeutic Dilantin level, essentially unremarkable electrolytes, normal blood counts, and CT scan of the head which is unremarkable for either a cause of his fall or an injury resulting from the fall. He was given intravenous Dilantin and will be discharged with a prescription for the same as it appears as though he is out, although he denies missing doses of this medication.  Allena Napoleon, personally performed the services described in this documentation. All medical record entries made by the scribe were at my direction and in my presence.  I have reviewed the chart and discharge instructions and agree that the record reflects my personal performance and is accurate and complete. Louis Ford.  09/26/2015. 7:03 AM.  Louis Speak, MD 09/26/15 [phone]

## 2015-09-26 NOTE — ED Notes (Signed)
CBG 96. 

## 2015-09-26 NOTE — ED Notes (Signed)
Per EMS pt has had SOB for several weeks but has progressively gotten worse over the past 3 days; Pt refused assessment with EMS; Pt was found on the floor by roommate; suspected seizure; pt has hx of seizures; Pt a&o x 4 on arrival;

## 2015-09-26 NOTE — ED Notes (Signed)
Patient transported to CT 

## 2015-09-26 NOTE — Discharge Instructions (Signed)
Dilantin as prescribed.  Follow-up with your primary Dr. for a recheck in the next week.  Return to the ER if symptoms significantly worsen or change.   Epilepsy Epilepsy is a disorder in which a person has repeated seizures over time. A seizure is a release of abnormal electrical activity in the brain. Seizures can cause a change in attention, behavior, or the ability to remain awake and alert (altered mental status). Seizures often involve uncontrollable shaking (convulsions).  Most people with epilepsy lead normal lives. However, people with epilepsy are at an increased risk of falls, accidents, and injuries. Therefore, it is important to begin treatment right away. CAUSES  Epilepsy has many possible causes. Anything that disturbs the normal pattern of brain cell activity can lead to seizures. This may include:   Head injury.  Birth trauma.  High fever as a child.  Stroke.  Bleeding into or around the brain.  Certain drugs.  Prolonged low oxygen, such as what occurs after CPR efforts.  Abnormal brain development.  Certain illnesses, such as meningitis, encephalitis (brain infection), malaria, and other infections.  An imbalance of nerve signaling chemicals (neurotransmitters).  SIGNS AND SYMPTOMS  The symptoms of a seizure can vary greatly from one person to another. Right before a seizure, you may have a warning (aura) that a seizure is about to occur. An aura may include the following symptoms:  Fear or anxiety.  Nausea.  Feeling like the room is spinning (vertigo).  Vision changes, such as seeing flashing lights or spots. Common symptoms during a seizure include:  Abnormal sensations, such as an abnormal smell or a bitter taste in the mouth.   Sudden, general body stiffness.   Convulsions that involve rhythmic jerking of the face, arm, or leg on one or both sides.   Sudden change in consciousness.   Appearing to be awake but not responding.    Appearing to be asleep but cannot be awakened.   Grimacing, chewing, lip smacking, drooling, tongue biting, or loss of bowel or bladder control. After a seizure, you may feel sleepy for a while. DIAGNOSIS  Your health care provider will ask about your symptoms and take a medical history. Descriptions from any witnesses to your seizures will be very helpful in the diagnosis. A physical exam, including a detailed neurological exam, is necessary. Various tests may be done, such as:   An electroencephalogram (EEG). This is a painless test of your brain waves. In this test, a diagram is created of your brain waves. These diagrams can be interpreted by a specialist.  An MRI of the brain.   A CT scan of the brain.   A spinal tap (lumbar puncture, LP).  Blood tests to check for signs of infection or abnormal blood chemistry. TREATMENT  There is no cure for epilepsy, but it is generally treatable. Once epilepsy is diagnosed, it is important to begin treatment as soon as possible. For most people with epilepsy, seizures can be controlled with medicines. The following may also be used:  A pacemaker for the brain (vagus nerve stimulator) can be used for people with seizures that are not well controlled by medicine.  Surgery on the brain. For some people, epilepsy eventually goes away. HOME CARE INSTRUCTIONS   Follow your health care provider's recommendations on driving and safety in normal activities.  Get enough rest. Lack of sleep can cause seizures.  Only take over-the-counter or prescription medicines as directed by your health care provider. Take any prescribed medicine exactly  as directed.  Avoid any known triggers of your seizures.  Keep a seizure diary. Record what you recall about any seizure, especially any possible trigger.   Make sure the people you live and work with know that you are prone to seizures. They should receive instructions on how to help you. In general, a  witness to a seizure should:   Cushion your head and body.   Turn you on your side.   Avoid unnecessarily restraining you.   Not place anything inside your mouth.   Call for emergency medical help if there is any question about what has occurred.   Follow up with your health care provider as directed. You may need regular blood tests to monitor the levels of your medicine.  SEEK MEDICAL CARE IF:   You develop signs of infection or other illness. This might increase the risk of a seizure.   You seem to be having more frequent seizures.   Your seizure pattern is changing.  SEEK IMMEDIATE MEDICAL CARE IF:   You have a seizure that does not stop after a few moments.   You have a seizure that causes any difficulty in breathing.   You have a seizure that results in a very severe headache.   You have a seizure that leaves you with the inability to speak or use a part of your body.  Document Released: 12/10/2005 Document Revised: 09/30/2013 Document Reviewed: 07/22/2013 [facility] Patient Information 2015 [place], [place]. This information is not intended to replace advice given to you by your health care provider. Make sure you discuss any questions you have with your health care provider.

## 2015-09-30 ENCOUNTER — Ambulatory Visit (INDEPENDENT_AMBULATORY_CARE_PROVIDER_SITE_OTHER): Payer: Medicare Other | Admitting: Pharmacist

## 2015-09-30 DIAGNOSIS — Z7901 Long term (current) use of anticoagulants: Secondary | ICD-10-CM

## 2015-09-30 DIAGNOSIS — Z5181 Encounter for therapeutic drug level monitoring: Secondary | ICD-10-CM | POA: Diagnosis not present

## 2015-09-30 DIAGNOSIS — I4891 Unspecified atrial fibrillation: Secondary | ICD-10-CM

## 2015-09-30 LAB — PROTIME-INR
INR: 7.72 (ref <1.50)
PROTHROMBIN TIME: 66.5 s — AB (ref 11.6–15.2)

## 2015-09-30 LAB — POCT INR

## 2015-10-04 ENCOUNTER — Encounter (HOSPITAL_COMMUNITY): Payer: Self-pay | Admitting: Cardiology

## 2015-10-04 ENCOUNTER — Ambulatory Visit (INDEPENDENT_AMBULATORY_CARE_PROVIDER_SITE_OTHER): Payer: Medicare Other | Admitting: Cardiovascular Disease

## 2015-10-04 ENCOUNTER — Encounter (INDEPENDENT_AMBULATORY_CARE_PROVIDER_SITE_OTHER): Payer: Medicare Other

## 2015-10-04 ENCOUNTER — Inpatient Hospital Stay (HOSPITAL_COMMUNITY)
Admission: AD | Admit: 2015-10-04 | Discharge: 2015-10-20 | DRG: 286 | Disposition: A | Discharge status: Skilled Nursing Facility | Payer: Medicare Other | Source: Ambulatory Visit | Attending: Cardiovascular Disease | Admitting: Cardiovascular Disease

## 2015-10-04 ENCOUNTER — Inpatient Hospital Stay (HOSPITAL_COMMUNITY): Payer: Medicare Other

## 2015-10-04 ENCOUNTER — Encounter: Payer: Self-pay | Admitting: Cardiovascular Disease

## 2015-10-04 VITALS — BP 98/72 | HR 73 | Ht 71.0 in | Wt 247.0 lb

## 2015-10-04 DIAGNOSIS — I959 Hypotension, unspecified: Secondary | ICD-10-CM | POA: Diagnosis not present

## 2015-10-04 DIAGNOSIS — I130 Hypertensive heart and chronic kidney disease with heart failure and stage 1 through stage 4 chronic kidney disease, or unspecified chronic kidney disease: Secondary | ICD-10-CM | POA: Diagnosis not present

## 2015-10-04 DIAGNOSIS — R109 Unspecified abdominal pain: Secondary | ICD-10-CM | POA: Diagnosis not present

## 2015-10-04 DIAGNOSIS — M1000 Idiopathic gout, unspecified site: Secondary | ICD-10-CM | POA: Diagnosis not present

## 2015-10-04 DIAGNOSIS — R262 Difficulty in walking, not elsewhere classified: Secondary | ICD-10-CM | POA: Diagnosis not present

## 2015-10-04 DIAGNOSIS — I255 Ischemic cardiomyopathy: Secondary | ICD-10-CM

## 2015-10-04 DIAGNOSIS — Z6832 Body mass index (BMI) 32.0-32.9, adult: Secondary | ICD-10-CM

## 2015-10-04 DIAGNOSIS — G40909 Epilepsy, unspecified, not intractable, without status epilepticus: Secondary | ICD-10-CM | POA: Diagnosis present

## 2015-10-04 DIAGNOSIS — I2510 Atherosclerotic heart disease of native coronary artery without angina pectoris: Secondary | ICD-10-CM | POA: Diagnosis present

## 2015-10-04 DIAGNOSIS — R040 Epistaxis: Secondary | ICD-10-CM | POA: Diagnosis not present

## 2015-10-04 DIAGNOSIS — I5020 Unspecified systolic (congestive) heart failure: Secondary | ICD-10-CM | POA: Diagnosis not present

## 2015-10-04 DIAGNOSIS — I480 Paroxysmal atrial fibrillation: Secondary | ICD-10-CM

## 2015-10-04 DIAGNOSIS — I5023 Acute on chronic systolic (congestive) heart failure: Secondary | ICD-10-CM | POA: Diagnosis not present

## 2015-10-04 DIAGNOSIS — R0600 Dyspnea, unspecified: Secondary | ICD-10-CM

## 2015-10-04 DIAGNOSIS — E6609 Other obesity due to excess calories: Secondary | ICD-10-CM | POA: Diagnosis not present

## 2015-10-04 DIAGNOSIS — R0602 Shortness of breath: Secondary | ICD-10-CM

## 2015-10-04 DIAGNOSIS — I4891 Unspecified atrial fibrillation: Secondary | ICD-10-CM | POA: Diagnosis not present

## 2015-10-04 DIAGNOSIS — N183 Chronic kidney disease, stage 3 (moderate): Secondary | ICD-10-CM | POA: Diagnosis present

## 2015-10-04 DIAGNOSIS — Z8673 Personal history of transient ischemic attack (TIA), and cerebral infarction without residual deficits: Secondary | ICD-10-CM | POA: Diagnosis not present

## 2015-10-04 DIAGNOSIS — L7632 Postprocedural hematoma of skin and subcutaneous tissue following other procedure: Secondary | ICD-10-CM | POA: Diagnosis not present

## 2015-10-04 DIAGNOSIS — I10 Essential (primary) hypertension: Secondary | ICD-10-CM | POA: Diagnosis not present

## 2015-10-04 DIAGNOSIS — I25810 Atherosclerosis of coronary artery bypass graft(s) without angina pectoris: Secondary | ICD-10-CM | POA: Diagnosis not present

## 2015-10-04 DIAGNOSIS — Z8249 Family history of ischemic heart disease and other diseases of the circulatory system: Secondary | ICD-10-CM | POA: Diagnosis not present

## 2015-10-04 DIAGNOSIS — I472 Ventricular tachycardia: Secondary | ICD-10-CM | POA: Diagnosis not present

## 2015-10-04 DIAGNOSIS — Z452 Encounter for adjustment and management of vascular access device: Secondary | ICD-10-CM | POA: Diagnosis not present

## 2015-10-04 DIAGNOSIS — Z951 Presence of aortocoronary bypass graft: Secondary | ICD-10-CM | POA: Diagnosis not present

## 2015-10-04 DIAGNOSIS — I482 Chronic atrial fibrillation: Secondary | ICD-10-CM | POA: Diagnosis not present

## 2015-10-04 DIAGNOSIS — K5900 Constipation, unspecified: Secondary | ICD-10-CM | POA: Diagnosis not present

## 2015-10-04 DIAGNOSIS — Y840 Cardiac catheterization as the cause of abnormal reaction of the patient, or of later complication, without mention of misadventure at the time of the procedure: Secondary | ICD-10-CM | POA: Diagnosis not present

## 2015-10-04 DIAGNOSIS — I5022 Chronic systolic (congestive) heart failure: Secondary | ICD-10-CM

## 2015-10-04 DIAGNOSIS — Z95810 Presence of automatic (implantable) cardiac defibrillator: Secondary | ICD-10-CM

## 2015-10-04 DIAGNOSIS — Z7901 Long term (current) use of anticoagulants: Secondary | ICD-10-CM | POA: Diagnosis not present

## 2015-10-04 DIAGNOSIS — Z87891 Personal history of nicotine dependence: Secondary | ICD-10-CM

## 2015-10-04 DIAGNOSIS — Z8614 Personal history of Methicillin resistant Staphylococcus aureus infection: Secondary | ICD-10-CM

## 2015-10-04 DIAGNOSIS — E785 Hyperlipidemia, unspecified: Secondary | ICD-10-CM | POA: Diagnosis not present

## 2015-10-04 DIAGNOSIS — I509 Heart failure, unspecified: Secondary | ICD-10-CM | POA: Diagnosis not present

## 2015-10-04 DIAGNOSIS — Z79899 Other long term (current) drug therapy: Secondary | ICD-10-CM

## 2015-10-04 DIAGNOSIS — N179 Acute kidney failure, unspecified: Secondary | ICD-10-CM | POA: Diagnosis present

## 2015-10-04 DIAGNOSIS — R600 Localized edema: Secondary | ICD-10-CM

## 2015-10-04 DIAGNOSIS — M10042 Idiopathic gout, left hand: Secondary | ICD-10-CM | POA: Diagnosis not present

## 2015-10-04 DIAGNOSIS — G4089 Other seizures: Secondary | ICD-10-CM | POA: Diagnosis not present

## 2015-10-04 DIAGNOSIS — R05 Cough: Secondary | ICD-10-CM | POA: Diagnosis not present

## 2015-10-04 DIAGNOSIS — I639 Cerebral infarction, unspecified: Secondary | ICD-10-CM | POA: Diagnosis not present

## 2015-10-04 DIAGNOSIS — Y92230 Patient room in hospital as the place of occurrence of the external cause: Secondary | ICD-10-CM | POA: Diagnosis not present

## 2015-10-04 DIAGNOSIS — N189 Chronic kidney disease, unspecified: Secondary | ICD-10-CM | POA: Diagnosis not present

## 2015-10-04 DIAGNOSIS — I5043 Acute on chronic combined systolic (congestive) and diastolic (congestive) heart failure: Secondary | ICD-10-CM | POA: Diagnosis not present

## 2015-10-04 DIAGNOSIS — D682 Hereditary deficiency of other clotting factors: Secondary | ICD-10-CM | POA: Diagnosis present

## 2015-10-04 DIAGNOSIS — M1A9XX0 Chronic gout, unspecified, without tophus (tophi): Secondary | ICD-10-CM | POA: Diagnosis not present

## 2015-10-04 DIAGNOSIS — M6281 Muscle weakness (generalized): Secondary | ICD-10-CM | POA: Diagnosis not present

## 2015-10-04 DIAGNOSIS — N289 Disorder of kidney and ureter, unspecified: Secondary | ICD-10-CM

## 2015-10-04 DIAGNOSIS — R609 Edema, unspecified: Secondary | ICD-10-CM | POA: Diagnosis not present

## 2015-10-04 DIAGNOSIS — I5021 Acute systolic (congestive) heart failure: Secondary | ICD-10-CM | POA: Diagnosis not present

## 2015-10-04 LAB — TROPONIN I: Troponin I: <0.03 ng/mL (ref <0.031)

## 2015-10-04 LAB — COMPREHENSIVE METABOLIC PANEL WITH GFR
ALT: 14 U/L — ABNORMAL LOW (ref 17–63)
AST: 24 U/L (ref 15–41)
Albumin: 3.1 g/dL — ABNORMAL LOW (ref 3.5–5.0)
Alkaline Phosphatase: 79 U/L (ref 38–126)
Anion gap: 8 (ref 5–15)
BUN: 38 mg/dL — ABNORMAL HIGH (ref 6–20)
CO2: 25 mmol/L (ref 22–32)
Calcium: 8.8 mg/dL — ABNORMAL LOW (ref 8.9–10.3)
Chloride: 104 mmol/L (ref 101–111)
Creatinine, Ser: 1.49 mg/dL — ABNORMAL HIGH (ref 0.61–1.24)
GFR calc Af Amer: 55 mL/min — ABNORMAL LOW
GFR calc non Af Amer: 47 mL/min — ABNORMAL LOW
Glucose, Bld: 97 mg/dL (ref 65–99)
Potassium: 4.1 mmol/L (ref 3.5–5.1)
Sodium: 137 mmol/L (ref 135–145)
Total Bilirubin: 1 mg/dL (ref 0.3–1.2)
Total Protein: 6.9 g/dL (ref 6.5–8.1)

## 2015-10-04 LAB — CBC WITH DIFFERENTIAL/PLATELET
BASOS ABS: 0 10*3/uL (ref 0.0–0.1)
BASOS PCT: 0 %
EOS ABS: 0.6 10*3/uL (ref 0.0–0.7)
EOS PCT: 10 %
HCT: 36.5 % — ABNORMAL LOW (ref 39.0–52.0)
Hemoglobin: 11.5 g/dL — ABNORMAL LOW (ref 13.0–17.0)
LYMPHS ABS: 1.1 10*3/uL (ref 0.7–4.0)
Lymphocytes Relative: 20 %
MCH: 28 pg (ref 26.0–34.0)
MCHC: 31.5 g/dL (ref 30.0–36.0)
MCV: 88.8 fL (ref 78.0–100.0)
Monocytes Absolute: 0.7 10*3/uL (ref 0.1–1.0)
Monocytes Relative: 13 %
NEUTROS PCT: 57 %
Neutro Abs: 3.1 10*3/uL (ref 1.7–7.7)
PLATELETS: 224 10*3/uL (ref 150–400)
RBC: 4.11 MIL/uL — AB (ref 4.22–5.81)
RDW: 15.9 % — ABNORMAL HIGH (ref 11.5–15.5)
WBC: 5.6 10*3/uL (ref 4.0–10.5)

## 2015-10-04 LAB — PROTIME-INR
INR: 4.47 — AB (ref 0.00–1.49)
Prothrombin Time: 41.3 seconds — ABNORMAL HIGH (ref 11.6–15.2)

## 2015-10-04 LAB — D-DIMER, QUANTITATIVE (NOT AT ARMC): D DIMER QUANT: 0.88 ug{FEU}/mL — AB (ref 0.00–0.48)

## 2015-10-04 LAB — BRAIN NATRIURETIC PEPTIDE: B NATRIURETIC PEPTIDE 5: 796.9 pg/mL — AB (ref 0.0–100.0)

## 2015-10-04 MED ORDER — SODIUM CHLORIDE 0.9 % IV SOLN
250.0000 mL | INTRAVENOUS | Status: DC | PRN
  Administered 2015-10-11: 250 mL via INTRAVENOUS

## 2015-10-04 MED ORDER — FUROSEMIDE 10 MG/ML IJ SOLN
80.0000 mg | Freq: Two times a day (BID) | INTRAMUSCULAR | Status: DC
  Administered 2015-10-04: 80 mg via INTRAVENOUS
  Filled 2015-10-04: qty 8

## 2015-10-04 MED ORDER — DILTIAZEM HCL 100 MG IV SOLR
5.0000 mg/h | INTRAVENOUS | Status: DC
  Administered 2015-10-04 – 2015-10-05 (×2): 5 mg/h via INTRAVENOUS
  Filled 2015-10-04 (×2): qty 100

## 2015-10-04 MED ORDER — PHENYTOIN SODIUM EXTENDED 100 MG PO CAPS
100.0000 mg | ORAL_CAPSULE | Freq: Three times a day (TID) | ORAL | Status: DC
  Administered 2015-10-04 – 2015-10-20 (×48): 100 mg via ORAL
  Filled 2015-10-04 (×48): qty 1

## 2015-10-04 MED ORDER — SODIUM CHLORIDE 0.9 % IJ SOLN
3.0000 mL | Freq: Two times a day (BID) | INTRAMUSCULAR | Status: DC
  Administered 2015-10-04 – 2015-10-13 (×14): 3 mL via INTRAVENOUS

## 2015-10-04 MED ORDER — SODIUM CHLORIDE 0.9 % IJ SOLN: 3.0000 mL | INTRAMUSCULAR | Status: DC | PRN

## 2015-10-04 MED ORDER — ONDANSETRON HCL 4 MG/2ML IJ SOLN: 4.0000 mg | Freq: Four times a day (QID) | INTRAMUSCULAR | Status: DC | PRN

## 2015-10-04 MED ORDER — ACETAMINOPHEN 325 MG PO TABS
650.0000 mg | ORAL_TABLET | ORAL | Status: DC | PRN
  Administered 2015-10-04 – 2015-10-10 (×6): 650 mg via ORAL
  Filled 2015-10-04 (×6): qty 2

## 2015-10-04 MED ORDER — CARVEDILOL 25 MG PO TABS
25.0000 mg | ORAL_TABLET | Freq: Two times a day (BID) | ORAL | Status: DC
  Administered 2015-10-04 – 2015-10-10 (×11): 25 mg via ORAL
  Filled 2015-10-04 (×12): qty 1

## 2015-10-04 MED ORDER — FUROSEMIDE 10 MG/ML IJ SOLN
80.0000 mg | Freq: Two times a day (BID) | INTRAMUSCULAR | Status: DC
  Administered 2015-10-05: 80 mg via INTRAVENOUS
  Filled 2015-10-04: qty 8

## 2015-10-04 NOTE — H&P (Signed)
Chief Complaint  Patient presents with  . Shortness of Breath    History of Present Illness: 69 yo male with history of CAD s/p CABG, ischemic cardiomyopathy s/p ICD, chronic systolic CHF, HTN, HLD, atrial fibrillation, previous stroke, Factor 7 deficiency and seizure disorder who walked into our office today with c/o SOB. He is followed in our office by Dr. Lovena Le. I am the office doctor of the day today and was asked to evaluate him. He has known ischemic cardiomyopathy with last LVEF=30-35% by echo April 2015. He moved to [place] from [place] several years ago and has no family here. He was admitted to [facility] 09/21/2012 with acute on chronic systolic HF. On admission he was also found to have rapid AF. He was started on IV heparin and Coumadin but later developed upper extremity hematomas and epitaxis. Anticoagulants stopped and hematology was consulted. He is known to have Factor 7 dificiency. He underwent TEE-guided and successful DCCV on 09/24/2012. He was diuresed with IV lasix . Also had a seizure and he was later started on Keppra. Discharge weight 198 pounds at that time. He followed up once in CHF clinic in December 2013 and has not shown for appointments there since. He has since been seen several times by Dr. Lovena Le and was restarted on coumadin December 2014. His weight is up 40+ lbs over the last year. Lasix increased to 80 mg daily at follow up appt with Dr. Lovena Le July 2016. He presented to [facility] 09/26/15 with a seizure. He sustained injury to his left chest wall after falling. CT head negative. Dilantin level sub-therapeutic. ? INR was not checked. His coumadin was continued. He was seen in the coumadin clinic 09/30/15 and INR was over 8.0. His coumadin was held at that time.   Today he walks into our office and reports shortness of breath over the last 6-7 days. He can only walk several feet before becoming dyspneic. He has no chest pain with exertion but his chest wall is very sore over the  left side in the area of the hematoma. His device is on the right side. No increase in LE edema. Weight has been stable over last three months but he does endorse 40-45 lb weight gain over last year. No fever, chills, rigors. He has been taking all meds as prescribed. Normal urine output at home. He has noticed his heart racing. No near syncope or syncope.   Primary Care Physician: Elizabeth Palau, MD Primary Cardiology: Lovena Le  Past Medical History  Diagnosis Date  . CAD (coronary artery disease)   . Chronic systolic heart failure ([place])   . Ischemic cardiomyopathy     EF 15 to 20% by TTE and TEE in 09/2012. Severe LV dysfunction  . HTN (hypertension)   . Hyperlipidemia   . Obesity     BMI 31 in 09/2012  . Atrial fibrillation  09/22/2012  . implantable cardiac defibrillator-Biotronik     Device Implanted 2006; s/p gen change 03/2011 : bleeding persistent with pocket erosion and infection; explant and reimplant 06/2011  . Stroke ([place])   . 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 History of MRSA infection 05/2011  . Seizure disorder ([place]) latest 09/30/2012  . 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 Seizures [facility])     Past Surgical History  Procedure Laterality Date  . Coronary artery bypass graft    . Tee without cardioversion  09/24/2012    Procedure: TRANSESOPHAGEAL ECHOCARDIOGRAM (TEE); Surgeon: [name], MD; Location: [place];  Service: Cardiovascular; Laterality: N/A; dave/anesth, dl, cindy/echo   . Cardioversion  09/24/2012    Procedure: CARDIOVERSION; Surgeon: [name], MD; Location: [place]; Service: Cardiovascular; Laterality: N/A;  . Icd      Current Outpatient Prescriptions  Medication Sig Dispense Refill  . acetaminophen (TYLENOL) 325 MG tablet Take 650 mg by mouth every 6 (six) hours as needed for mild pain.    . carvedilol (COREG) 25  MG tablet Take 1 tablet (25 mg total) by mouth 2 (two) times daily. 180 tablet 3  . furosemide (LASIX) 40 MG tablet Take 2 tablets (80 mg total) by mouth daily. 180 tablet 3  . lisinopril (PRINIVIL,ZESTRIL) 5 MG tablet Take 1 tablet (5 mg total) by mouth daily. 90 tablet 1  . phenytoin (DILANTIN) 100 MG ER capsule Take 1 capsule (100 mg total) by mouth 3 (three) times daily. 90 capsule 0  . warfarin (COUMADIN) 5 MG tablet TAKE AS DIRECTED BY COUMADIN CLINIC (Patient taking differently: Take 5-7.5 mg by mouth daily. 5mg  daily except 7.5mg  on Saturdays) 35 tablet 3   No current facility-administered medications for this visit.    No Known Allergies  Social History   Social History  . Marital Status: Single    Spouse Name: N/A  . Number of Children: N/A  . Years of Education: N/A   Occupational History  . Not on file.   Social History Main Topics  . Smoking status: Former Smoker    Types: Pipe    Quit date: 09/21/2008  . Smokeless tobacco: Former Systems developer    Quit date: 09/21/2008  . Alcohol Use: No  . Drug Use: No  . Sexual Activity: No   Other Topics Concern  . Not on file   Social History Narrative   ** Merged History Encounter **       Family History  Problem Relation Age of Onset  . Arthritis Mother   . Heart disease Mother   . Other Father     smoker  . Heart attack Mother   . Hypertension Neg Hx     unknown  . Stroke Neg Hx     unknown    Review of Systems: As stated in the HPI and otherwise negative.   BP 98/72 mmHg  Pulse 73  Ht 5\' 11"  (1.803 m)  Wt 247 lb (112.038 kg)  BMI 34.46 kg/m2  SpO2 90%  Physical Examination: General: Well developed, well nourished, NAD  HEENT: OP clear, mucus membranes moist  SKIN: warm, dry. No rashes. Neuro: No focal deficits  Musculoskeletal: Muscle strength 5/5 all ext  Psychiatric:  Mood and affect normal  Neck: No JVD, no carotid bruits, no thyromegaly, no lymphadenopathy.  Lungs:Clear bilaterally, no wheezes, rhonci, crackles Cardiovascular: Irregular tachy. No murmurs, gallops or rubs. Abdomen:Soft. Bowel sounds present. Non-tender.  Extremities: No lower extremity edema. Pulses are 2 + in the bilateral DP/PT.  Echo 04/21/14: Left ventricle: The cavity size was normal. There was mild concentric hypertrophy. Systolic function was moderately to severely reduced. The estimated ejection fraction was in the range of 30% to 35%. Diffuse hypokinesis. Impressions: - Compared with previous TTE and TEE from 2013, LVEF has improved. The small mobile aortic valve echodensity was present on those images, but appears slightly larger on the current study. It is therefore highly unlikely to be a vegetation. A slowly growing fibroelastoma vannot be fully excluded.  EKG: EKG is ordered today. The ekg ordered today demonstrates atrial fibrillation, rate 108 bpm. Non-specific ST and  T wave abnormality  Recent Labs: 09/26/2015: ALT 30; BUN 24*; Creatinine, Ser 1.19; Hemoglobin 14.8; Platelets 110*; Potassium 5.1; Sodium 136   Wt Readings from Last 3 Encounters:  10/04/15 247 lb (112.038 kg)  09/26/15 250 lb (113.399 kg)  07/08/15 248 lb (112.492 kg)     Other studies Reviewed: Additional studies/ records that were reviewed today include:  Review of the above records demonstrates:    Assessment and Plan:   1. Acute on chronic systolic CHF/Ischemic cardiomyopathy: He is acutely more dyspneic although he does not appear to be grossly volume overloaded. Chart review shows 40-45 lb weight gain over last year though wieghts are stable last 3 months. Will check BNP. One dose of IV Lasix tonight and reassess volume status in the am. Last LVEF=30-35% April 2015. Will repeat echo.   2. Dyspnea: Unclear etiology. Will get chest x-ray to exclude pleural  effusion/hemothorax. Suspicion is low for hemothorax but INR was over 8 last week and he has a large hematoma over his left chest wall. Likely unrelated. Will check d-dimer but low suspicion for PE. Will check CBC to exclude anemia given recent supra-therapeutic INR and large chest wall hematoma.   3. Atrial fibrillation with RVR: Will admit to telemetry unit. Continue beta blocker. Will start low dose Cardizem drip at 5 mg/hour. Coumadin is on hold with recent supra-therapeutic INR. Check INR today.   4. CAD s/p CABG: No exertional chest pain. Non-specific changes on EKG. Will cycle troponin tonight.   Current medicines are reviewed at length with the patient today. The patient does not have concerns regarding medicines.  The following changes have been made: no change  Labs/ tests ordered today include:  No orders of the defined types were placed in this encounter.   Disposition: FU with Dr. Lovena Le following discharge.   Signed, Lauree Chandler, MD 10/04/2015 1:57 PM  [facility] [place], [place], [place] [address] Phone: [phone]; Fax: [phone]

## 2015-10-04 NOTE — Progress Notes (Signed)
Chief Complaint  Patient presents with  . Shortness of Breath    History of Present Illness: 69 yo male with history of CAD s/p CABG, ischemic cardiomyopathy s/p ICD, chronic systolic CHF, HTN, HLD, atrial fibrillation, previous stroke, Factor 7 deficiency and seizure disorder who walked into our office today with c/o SOB. He is followed in our office by Dr. Lovena Le. I am the office doctor of the day today and was asked to evaluate him. He has known ischemic cardiomyopathy with last LVEF=30-35% by echo April 2015. He moved to [place] from [place] several years ago and has no family here. He was admitted to [facility] 09/21/2012 with acute on chronic systolic HF. On admission he was also found to have rapid AF. He was started on IV heparin and Coumadin but later developed upper extremity hematomas and epitaxis. Anticoagulants stopped and hematology was consulted. He is known to have Factor 7 dificiency. He underwent TEE-guided and successful DCCV on 09/24/2012. He was diuresed with IV lasix .  Also had a seizure and he was later started on Keppra. Discharge weight 198 pounds at that time. He followed up once in CHF clinic in December 2013 and has not shown for appointments there since. He has since been seen several times by Dr. Lovena Le and was restarted on coumadin December 2014. His weight is up 40+ lbs over the last year. Lasix increased to 80 mg daily at follow up appt with Dr. Lovena Le July 2016. He presented to [facility] 09/26/15 with a seizure. He sustained injury to his left chest wall after falling. CT head negative. Dilantin level sub-therapeutic. ? INR was not checked. His coumadin was continued. He was seen in the coumadin clinic 09/30/15 and INR was over 8.0. His coumadin was held at that time.   Today he walks into our office and reports shortness of breath over the last 6-7 days. He can only walk several feet before becoming dyspneic. He has no chest pain with exertion but his chest wall is very sore over  the left side in the area of the hematoma. His device is on the right side. No increase in LE edema. Weight has been stable over last three months but he does endorse 40-45 lb weight gain over last year. No fever, chills, rigors. He has been taking all meds as prescribed. Normal urine output at home. He has noticed his heart racing. No near syncope or syncope.   Primary Care Physician: Elizabeth Palau, MD Primary Cardiology: Lovena Le  Past Medical History  Diagnosis Date  . CAD (coronary artery disease)   . Chronic systolic heart failure ([place])   . Ischemic cardiomyopathy     EF 15 to 20% by TTE and TEE in 09/2012.  Severe LV dysfunction  . HTN (hypertension)   . Hyperlipidemia   . Obesity     BMI 31 in 09/2012  . Atrial fibrillation   09/22/2012  . implantable cardiac defibrillator-Biotronik     Device Implanted 2006; s/p gen change 03/2011 : bleeding persistent with pocket erosion and infection; explant and reimplant  06/2011  . Stroke ([place])   . 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 History of MRSA infection 05/2011  . Seizure disorder ([place]) latest 09/30/2012  . 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 Seizures [facility])     Past Surgical History  Procedure Laterality Date  . Coronary artery bypass graft    . Tee without cardioversion  09/24/2012    Procedure: TRANSESOPHAGEAL ECHOCARDIOGRAM (TEE);  Surgeon:  [name], MD;  Location: [place];  Service: Cardiovascular;  Laterality: N/A;  dave/anesth, dl, cindy/echo   . Cardioversion  09/24/2012    Procedure: CARDIOVERSION;  Surgeon: [name], MD;  Location: [place];  Service: Cardiovascular;  Laterality: N/A;  . Icd      Current Outpatient Prescriptions  Medication Sig Dispense Refill  . acetaminophen (TYLENOL) 325 MG tablet Take 650 mg by mouth every 6 (six) hours as needed for mild pain.    . carvedilol (COREG) 25 MG tablet Take 1 tablet (25 mg total) by mouth 2 (two) times daily. 180 tablet 3  .  furosemide (LASIX) 40 MG tablet Take 2 tablets (80 mg total) by mouth daily. 180 tablet 3  . lisinopril (PRINIVIL,ZESTRIL) 5 MG tablet Take 1 tablet (5 mg total) by mouth daily. 90 tablet 1  . phenytoin (DILANTIN) 100 MG ER capsule Take 1 capsule (100 mg total) by mouth 3 (three) times daily. 90 capsule 0  . warfarin (COUMADIN) 5 MG tablet TAKE AS DIRECTED BY  COUMADIN  CLINIC (Patient taking differently: Take 5-7.5 mg by mouth daily. 5mg  daily except 7.5mg  on Saturdays) 35 tablet 3   No current facility-administered medications for this visit.    No Known Allergies  Social History   Social History  . Marital Status: Single    Spouse Name: N/A  . Number of Children: N/A  . Years of Education: N/A   Occupational History  . Not on file.   Social History Main Topics  . Smoking status: Former Smoker    Types: Pipe    Quit date: 09/21/2008  . Smokeless tobacco: Former Systems developer    Quit date: 09/21/2008  . Alcohol Use: No  . Drug Use: No  . Sexual Activity: No   Other Topics Concern  . Not on file   Social History Narrative   ** Merged History Encounter **        Family History  Problem Relation Age of Onset  . Arthritis Mother   . Heart disease Mother   . Other Father     smoker  . Heart attack Mother   . Hypertension Neg Hx     unknown  . Stroke Neg Hx     unknown    Review of Systems:  As stated in the HPI and otherwise negative.   BP 98/72 mmHg  Pulse 73  Ht 5\' 11"  (1.803 m)  Wt 247 lb (112.038 kg)  BMI 34.46 kg/m2  SpO2 90%  Physical Examination: General: Well developed, well nourished, NAD HEENT: OP clear, mucus membranes moist SKIN: warm, dry. No rashes. Neuro: No focal deficits Musculoskeletal: Muscle strength 5/5 all ext Psychiatric: Mood and affect normal Neck: No JVD, no carotid bruits, no thyromegaly, no lymphadenopathy. Lungs:Clear bilaterally, no wheezes, rhonci, crackles Cardiovascular: Irregular tachy. No murmurs, gallops or  rubs. Abdomen:Soft. Bowel sounds present. Non-tender.  Extremities: No lower extremity edema. Pulses are 2 + in the bilateral DP/PT.  Echo 04/21/14: Left ventricle: The cavity size was normal. There was mild concentric hypertrophy. Systolic function was moderately to severely reduced. The estimated ejection fraction was in the range of 30% to 35%. Diffuse hypokinesis. Impressions: - Compared with previous TTE and TEE from 2013, LVEF has improved. The small mobile aortic valve echodensity was present on those images, but appears slightly larger on the current study. It is therefore highly unlikely to be a vegetation. A slowly growing fibroelastoma vannot be fully excluded.  EKG:  EKG is ordered  today. The ekg ordered today demonstrates atrial fibrillation, rate 108 bpm. Non-specific ST and T wave abnormality  Recent Labs: 09/26/2015: ALT 30; BUN 24*; Creatinine, Ser 1.19; Hemoglobin 14.8; Platelets 110*; Potassium 5.1; Sodium 136    Wt Readings from Last 3 Encounters:  10/04/15 247 lb (112.038 kg)  09/26/15 250 lb (113.399 kg)  07/08/15 248 lb (112.492 kg)     Other studies Reviewed: Additional studies/ records that were reviewed today include:  Review of the above records demonstrates:    Assessment and Plan:   1. Acute on chronic systolic CHF/Ischemic cardiomyopathy: He is acutely more dyspneic although he does not appear to be grossly volume overloaded. Chart review shows 40-45 lb weight gain over last year though wieghts are stable last 3 months. Will check BNP. One dose of IV Lasix tonight and reassess volume status in the am. Last LVEF=30-35% April 2015. Will repeat echo.   2. Dyspnea: Unclear etiology. Will get chest x-ray to exclude pleural effusion/hemothorax. Suspicion is low for hemothorax but INR was over 8 last week and he has a large hematoma over his left chest wall. Likely unrelated. Will check d-dimer but low suspicion for PE. Will check CBC to exclude  anemia given recent supra-therapeutic INR and large chest wall hematoma.   3. Atrial fibrillation with RVR: Will admit to telemetry unit. Continue beta blocker. Will start low dose Cardizem drip at 5 mg/hour. Coumadin is on hold with recent supra-therapeutic INR. Check INR today.   4. CAD s/p CABG: No exertional chest pain. Non-specific changes on EKG. Will cycle troponin tonight.   Current medicines are reviewed at length with the patient today.  The patient does not have concerns regarding medicines.  The following changes have been made:  no change  Labs/ tests ordered today include:  No orders of the defined types were placed in this encounter.    Disposition:   FU with Dr. Lovena Le following discharge.   Signed, Lauree Chandler, MD 10/04/2015 1:57 PM    [facility] [place], [place], [place]  [address] Phone: [phone]; Fax: [phone]

## 2015-10-05 ENCOUNTER — Inpatient Hospital Stay (HOSPITAL_COMMUNITY): Payer: Medicare Other

## 2015-10-05 DIAGNOSIS — R0600 Dyspnea, unspecified: Secondary | ICD-10-CM

## 2015-10-05 DIAGNOSIS — I5021 Acute systolic (congestive) heart failure: Secondary | ICD-10-CM

## 2015-10-05 LAB — CBC
HEMATOCRIT: 36.3 % — AB (ref 39.0–52.0)
Hemoglobin: 11.6 g/dL — ABNORMAL LOW (ref 13.0–17.0)
MCH: 28.4 pg (ref 26.0–34.0)
MCHC: 32 g/dL (ref 30.0–36.0)
MCV: 89 fL (ref 78.0–100.0)
Platelets: 223 10*3/uL (ref 150–400)
RBC: 4.08 MIL/uL — ABNORMAL LOW (ref 4.22–5.81)
RDW: 16.1 % — AB (ref 11.5–15.5)
WBC: 5.4 10*3/uL (ref 4.0–10.5)

## 2015-10-05 LAB — BASIC METABOLIC PANEL
Anion gap: 13 (ref 5–15)
BUN: 39 mg/dL — AB (ref 6–20)
CALCIUM: 8.7 mg/dL — AB (ref 8.9–10.3)
CO2: 22 mmol/L (ref 22–32)
Chloride: 102 mmol/L (ref 101–111)
Creatinine, Ser: 1.56 mg/dL — ABNORMAL HIGH (ref 0.61–1.24)
GFR calc Af Amer: 52 mL/min — ABNORMAL LOW (ref >60)
GFR, EST NON AFRICAN AMERICAN: 45 mL/min — AB (ref >60)
GLUCOSE: 119 mg/dL — AB (ref 65–99)
Potassium: 3.8 mmol/L (ref 3.5–5.1)
Sodium: 137 mmol/L (ref 135–145)

## 2015-10-05 LAB — TROPONIN I: Troponin I: <0.03 ng/mL (ref <0.031)

## 2015-10-05 LAB — PHENYTOIN LEVEL, TOTAL: Phenytoin Lvl: 9.4 ug/mL — ABNORMAL LOW (ref 10.0–20.0)

## 2015-10-05 LAB — MRSA PCR SCREENING: MRSA by PCR: NEGATIVE

## 2015-10-05 NOTE — Progress Notes (Signed)
SUBJECTIVE:  He has some pain on his chest where he fell with his seizure.  He reports continued SOB.  He is on room air.    PHYSICAL EXAM Filed Vitals:   10/04/15 1655 10/04/15 2008 10/05/15 0522 10/05/15 0744  BP: 106/73 100/77 100/62 101/74  Pulse: 106 98 81 80  Temp:  98.1 F (36.7 C) 97.7 F (36.5 C) 97.7 F (36.5 C)  TempSrc:  Oral Oral Oral  Resp: 20 20 20 18   Height:      Weight:   238 lb 6.4 oz (108.138 kg)   SpO2:  100% 98% 98%   General:  No distress Lungs:  Clear Heart:  Irregular, JVD to jaw at 45 degrees.  Abdomen:  Positive bowel sounds, no rebound no guarding Extremities:  No edema  Neuro:  Nonfocal  LABS: Lab Results  Component Value Date   TROPONINI <0.03 10/05/2015   Results for orders placed or performed during the hospital encounter of 10/04/15 (from the past 24 hour(s))  CBC WITH DIFFERENTIAL     Status: Abnormal   Collection Time: 10/04/15  4:04 PM  Result Value Ref Range   WBC 5.6 4.0 - 10.5 K/uL   RBC 4.11 (L) 4.22 - 5.81 MIL/uL   Hemoglobin 11.5 (L) 13.0 - 17.0 g/dL   HCT 36.5 (L) 39.0 - 52.0 %   MCV 88.8 78.0 - 100.0 fL   MCH 28.0 26.0 - 34.0 pg   MCHC 31.5 30.0 - 36.0 g/dL   RDW 15.9 (H) 11.5 - 15.5 %   Platelets 224 150 - 400 K/uL   Neutrophils Relative % 57 %   Neutro Abs 3.1 1.7 - 7.7 K/uL   Lymphocytes Relative 20 %   Lymphs Abs 1.1 0.7 - 4.0 K/uL   Monocytes Relative 13 %   Monocytes Absolute 0.7 0.1 - 1.0 K/uL   Eosinophils Relative 10 %   Eosinophils Absolute 0.6 0.0 - 0.7 K/uL   Basophils Relative 0 %   Basophils Absolute 0.0 0.0 - 0.1 K/uL  Comprehensive metabolic panel     Status: Abnormal   Collection Time: 10/04/15  4:04 PM  Result Value Ref Range   Sodium 137 135 - 145 mmol/L   Potassium 4.1 3.5 - 5.1 mmol/L   Chloride 104 101 - 111 mmol/L   CO2 25 22 - 32 mmol/L   Glucose, Bld 97 65 - 99 mg/dL   BUN 38 (H) 6 - 20 mg/dL   Creatinine, Ser 1.49 (H) 0.61 - 1.24 mg/dL   Calcium 8.8 (L) 8.9 - 10.3 mg/dL   Total  Protein 6.9 6.5 - 8.1 g/dL   Albumin 3.1 (L) 3.5 - 5.0 g/dL   AST 24 15 - 41 U/L   ALT 14 (L) 17 - 63 U/L   Alkaline Phosphatase 79 38 - 126 U/L   Total Bilirubin 1.0 0.3 - 1.2 mg/dL   GFR calc non Af Amer 47 (L) >60 mL/min   GFR calc Af Amer 55 (L) >60 mL/min   Anion gap 8 5 - 15  Protime-INR     Status: Abnormal   Collection Time: 10/04/15  4:04 PM  Result Value Ref Range   Prothrombin Time 41.3 (H) 11.6 - 15.2 seconds   INR 4.47 (H) 0.00 - 1.49  Brain natriuretic peptide     Status: Abnormal   Collection Time: 10/04/15  4:04 PM  Result Value Ref Range   B Natriuretic Peptide 796.9 (H) 0.0 - 100.0 pg/mL  Troponin  I     Status: None   Collection Time: 10/04/15  4:04 PM  Result Value Ref Range   Troponin I <0.03 <0.031 ng/mL  D-dimer, quantitative (not at [facility])     Status: Abnormal   Collection Time: 10/04/15  4:04 PM  Result Value Ref Range   D-Dimer, Quant 0.88 (H) 0.00 - 0.48 ug/mL-FEU  Troponin I     Status: None   Collection Time: 10/04/15 10:14 PM  Result Value Ref Range   Troponin I <0.03 <0.031 ng/mL  Troponin I     Status: None   Collection Time: 10/05/15  3:16 AM  Result Value Ref Range   Troponin I <0.03 <0.031 ng/mL  CBC     Status: Abnormal   Collection Time: 10/05/15  3:16 AM  Result Value Ref Range   WBC 5.4 4.0 - 10.5 K/uL   RBC 4.08 (L) 4.22 - 5.81 MIL/uL   Hemoglobin 11.6 (L) 13.0 - 17.0 g/dL   HCT 36.3 (L) 39.0 - 52.0 %   MCV 89.0 78.0 - 100.0 fL   MCH 28.4 26.0 - 34.0 pg   MCHC 32.0 30.0 - 36.0 g/dL   RDW 16.1 (H) 11.5 - 15.5 %   Platelets 223 150 - 400 K/uL  Basic metabolic panel     Status: Abnormal   Collection Time: 10/05/15  3:16 AM  Result Value Ref Range   Sodium 137 135 - 145 mmol/L   Potassium 3.8 3.5 - 5.1 mmol/L   Chloride 102 101 - 111 mmol/L   CO2 22 22 - 32 mmol/L   Glucose, Bld 119 (H) 65 - 99 mg/dL   BUN 39 (H) 6 - 20 mg/dL   Creatinine, Ser 1.56 (H) 0.61 - 1.24 mg/dL   Calcium 8.7 (L) 8.9 - 10.3 mg/dL   GFR calc non Af Amer  45 (L) >60 mL/min   GFR calc Af Amer 52 (L) >60 mL/min   Anion gap 13 5 - 15  Phenytoin level, total     Status: Abnormal   Collection Time: 10/05/15  3:16 AM  Result Value Ref Range   Phenytoin Lvl 9.4 (L) 10.0 - 20.0 ug/mL  MRSA PCR Screening     Status: None   Collection Time: 10/05/15  4:34 AM  Result Value Ref Range   MRSA by PCR NEGATIVE NEGATIVE    Intake/Output Summary (Last 24 hours) at 10/05/15 1021 Last data filed at 10/05/15 0941  Gross per 24 hour  Intake  711.5 ml  Output   1750 ml  Net -1038.5 ml   CXR:  Severe cardiac silhouette enlargement stable. Stable cardiac defibrillator. Mild central vascular congestion with no evidence of pulmonary edema. No infiltrate or effusion. Mild left lower lobe atelectasis. Stable left pericardial calcification.  EKG:  Atrial fib, rate 83, QT prolonged.  No acute ST T wave changes. 10/05/2015  ASSESSMENT AND PLAN:  ACUTE ON CHRONIC SYSTOLIC AND DIASTOLIC HF:    Echo is pending.  Good UO overnight.  Creat is up.  I will hold off on further diuretic for now until we see the echo.  He might be having low output failure given his symptoms.    CAD:  Enzymes negative.  I am not suspecting an acute cardiac event.  No ischemic work up is planned at this point.   HTN:   BP is low.  I would like to use Bidil if his BP allows.  ATRIAL FIB:  Warfarin is on hold given the recent supratherapeutic  level.   I will stop the Cardizem IV.  Continue beta blocker  CKD:   Creat increased.  Follow as above.   SEIZURES:  He still has a slightly low phenytoin level but it is near therapeutic and up from previous.  I will continue the same dose of phenytoin.    Jeneen Rinks [facility] 10/05/2015 10:21 AM

## 2015-10-05 NOTE — Clinical Documentation Improvement (Signed)
Cardiology  Can the diagnosis of CKD be further specified?   CKD Stage I - GFR greater than or equal to 90  CKD Stage II - GFR 60-89  CKD Stage III - GFR 30-59  Acute kidney injury on CKD (please specify stage of CKD)  Other condition  Unable to clinically determine  Supporting Information: : (risk factors, signs and symptoms, diagnostics, treatment) "CKD: creat increased" currently documented.  Current renal labs below.  Component      BUN Creatinine  Latest Ref Rng      6 - 20 mg/dL 0.61 - 1.24 mg/dL  10/04/2015     4:04 PM 38 (H) 1.49 (H)  10/05/2015      39 (H) 1.56 (H)   Component      EGFR (African American)  Latest Ref Rng      >60 mL/min  10/04/2015     4:04 PM 55 (L)  10/05/2015      52 (L)    Please exercise your independent, professional judgment when responding. A specific answer is not anticipated or expected. Please update your documentation within the medical record to reflect your response to this query.   Thank you, Mateo Flow, RN [phone] Clinical Documentation Specialist

## 2015-10-05 NOTE — Progress Notes (Signed)
Echocardiogram 2D Echocardiogram has been performed.  Louis Ford 10/05/2015, 4:15 PM

## 2015-10-05 NOTE — Progress Notes (Signed)
Utilization review completed. Lorali Khamis, RN, BSN. 

## 2015-10-06 LAB — BASIC METABOLIC PANEL
Anion gap: 7 (ref 5–15)
BUN: 31 mg/dL — AB (ref 6–20)
CO2: 25 mmol/L (ref 22–32)
CREATININE: 1.37 mg/dL — AB (ref 0.61–1.24)
Calcium: 8 mg/dL — ABNORMAL LOW (ref 8.9–10.3)
Chloride: 101 mmol/L (ref 101–111)
GFR calc Af Amer: >60 mL/min (ref >60)
GFR, EST NON AFRICAN AMERICAN: 52 mL/min — AB (ref >60)
GLUCOSE: 119 mg/dL — AB (ref 65–99)
Potassium: 3.8 mmol/L (ref 3.5–5.1)
SODIUM: 133 mmol/L — AB (ref 135–145)

## 2015-10-06 LAB — PROTIME-INR
INR: 2.28 — ABNORMAL HIGH (ref 0.00–1.49)
PROTHROMBIN TIME: 24.9 s — AB (ref 11.6–15.2)

## 2015-10-06 MED ORDER — FUROSEMIDE 10 MG/ML IJ SOLN
40.0000 mg | Freq: Two times a day (BID) | INTRAMUSCULAR | Status: DC
  Administered 2015-10-06 – 2015-10-09 (×6): 40 mg via INTRAVENOUS
  Filled 2015-10-06 (×6): qty 4

## 2015-10-06 MED ORDER — MILRINONE IN DEXTROSE 20 MG/100ML IV SOLN
0.1250 ug/kg/min | INTRAVENOUS | Status: DC
  Administered 2015-10-06: 0.125 ug/kg/min via INTRAVENOUS

## 2015-10-06 MED ORDER — MILRINONE IN DEXTROSE 20 MG/100ML IV SOLN
0.1250 ug/kg/min | INTRAVENOUS | Status: DC
  Filled 2015-10-06: qty 100

## 2015-10-06 MED ORDER — WARFARIN - PHARMACIST DOSING INPATIENT
Freq: Every day | Status: DC
  Administered 2015-10-06 – 2015-10-18 (×7)

## 2015-10-06 MED ORDER — WARFARIN SODIUM 5 MG PO TABS
5.0000 mg | ORAL_TABLET | Freq: Once | ORAL | Status: AC
  Administered 2015-10-06: 5 mg via ORAL
  Filled 2015-10-06: qty 1

## 2015-10-06 NOTE — Progress Notes (Signed)
ANTICOAGULATION CONSULT NOTE - Initial Consult  Pharmacy Consult for Coumadin Indication: atrial fibrillation  No Known Allergies  Patient Measurements: Height: 5\' 11"  (180.3 cm) Weight: 217 lb 6 oz (98.6 kg) IBW/kg (Calculated) : 75.3  Vital Signs: Temp: 98.2 F (36.8 C) (10/13 1119) Temp Source: Oral (10/13 1119) BP: 95/79 mmHg (10/13 1208) Pulse Rate: 96 (10/13 1208)  Labs:  Recent Labs  10/04/15 1604 10/04/15 2214 10/05/15 0316 10/06/15 0443  HGB 11.5*  --  11.6*  --   HCT 36.5*  --  36.3*  --   PLT 224  --  223  --   LABPROT 41.3*  --   --  24.9*  INR 4.47*  --   --  2.28*  CREATININE 1.49*  --  1.56* 1.37*  TROPONINI <0.03 <0.03 <0.03  --     Estimated Creatinine Clearance: 63.5 mL/min (by C-G formula based on Cr of 1.37).   Medical History: Past Medical History  Diagnosis Date  . CAD (coronary artery disease)   . Chronic systolic heart failure ([place])   . Ischemic cardiomyopathy     EF 15 to 20% by TTE and TEE in 09/2012.  Severe LV dysfunction  . HTN (hypertension)   . Hyperlipidemia   . Obesity     BMI 31 in 09/2012  . Atrial fibrillation   09/22/2012  . implantable cardiac defibrillator-Biotronik     Device Implanted 2006; s/p gen change 03/2011 : bleeding persistent with pocket erosion and infection; explant and reimplant  06/2011  . Stroke ([place])   . 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 History of MRSA infection 05/2011  . Seizure disorder ([place]) latest 09/30/2012  . 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 Seizures ([place])    Assessment:  On Coumadin for afib.  INR up to 7.72 in the office on 10/7. Coumadin has been on hold since that time.   INR down to 2.28 today and Coumadin to resume.     Seen in the ED on 10/3 with seizure. Dilantin level was < 2.5 on 100 mg daily dose. Dilantin loading dose given and dose increased to 100 mg TID.  This could have effected INR.  Dilantin level 9.4 on 10/12.  Corrects to 13.1 mcg/ml with albumin 3.1.   Therapeutic.   Home Coumadin dose:  5 mg daily  Goal of Therapy:  INR 2-3 Monitor platelets by anticoagulation protocol: Yes   Plan:   Coumadin 5 mg x 1 today.  Daily PT/INR for now.  May drop again by am.  Arty Baumgartner, [place] Pager: [phone] 10/06/2015,12:11 PM

## 2015-10-06 NOTE — Progress Notes (Signed)
   10/06/15 1525  Clinical Encounter Type  Visited With Patient;Health care provider  Visit Type Initial  Stress Factors  Patient Stress Factors Health changes   Chaplain met with patient, dialogued about his story and current events, offered support, and introduced chaplain services. Chaplain support available as needed.   Jeri Lager, Chaplain 10/06/2015 3:27 PM

## 2015-10-06 NOTE — Progress Notes (Signed)
    SUBJECTIVE:  He has some pain on his chest where he fell with his seizure.  He reports continued SOB.  He is on room air and sats are OK.  However, he continues to have subjective SOB.  Neck veins are up.    PHYSICAL EXAM Filed Vitals:   10/06/15 0032 10/06/15 0037 10/06/15 0447 10/06/15 0920  BP: 81/60 86/68 102/63 88/64  Pulse: 89  88 91  Temp: 98.2 F (36.8 C)  98.5 F (36.9 C) 97.8 F (36.6 C)  TempSrc: Oral  Oral Oral  Resp: 17  18 20   Height:      Weight:   217 lb 6 oz (98.6 kg)   SpO2: 90%  98% 98%   General:  No distress Lungs:  Clear Heart:  Irregular, JVD to jaw at 45 degrees.  Abdomen:  Positive bowel sounds, no rebound no guarding Extremities:  No edema  Neuro:  Nonfocal  LABS: Lab Results  Component Value Date   TROPONINI <0.03 10/05/2015   Results for orders placed or performed during the hospital encounter of 10/04/15 (from the past 24 hour(s))  Basic metabolic panel     Status: Abnormal   Collection Time: 10/06/15  4:43 AM  Result Value Ref Range   Sodium 133 (L) 135 - 145 mmol/L   Potassium 3.8 3.5 - 5.1 mmol/L   Chloride 101 101 - 111 mmol/L   CO2 25 22 - 32 mmol/L   Glucose, Bld 119 (H) 65 - 99 mg/dL   BUN 31 (H) 6 - 20 mg/dL   Creatinine, Ser 1.37 (H) 0.61 - 1.24 mg/dL   Calcium 8.0 (L) 8.9 - 10.3 mg/dL   GFR calc non Af Amer 52 (L) >60 mL/min   GFR calc Af Amer >60 >60 mL/min   Anion gap 7 5 - 15  Protime-INR     Status: Abnormal   Collection Time: 10/06/15  4:43 AM  Result Value Ref Range   Prothrombin Time 24.9 (H) 11.6 - 15.2 seconds   INR 2.28 (H) 0.00 - 1.49    Intake/Output Summary (Last 24 hours) at 10/06/15 1011 Last data filed at 10/06/15 0900  Gross per 24 hour  Intake    940 ml  Output   1250 ml  Net   -310 ml    ASSESSMENT AND PLAN:  ACUTE ON CHRONIC SYSTOLIC AND DIASTOLIC HF:    Echo EF 20-25%.  Good UO overnight.  Creat is stable.  I restarted Lasix.  I will try milrinone.  BP will not allow med titration  otherwise.    CAD:  Enzymes negative.  I am not suspecting an acute cardiac event.  No ischemic work up is planned at this point.   HTN:   BP is low.  I would like to use Bidil if his BP allows.  BP will not allow med titration.   ATRIAL FIB:  Warfarin is on hold given the recent supratherapeutic level.   I will stop the Cardizem IV.  Continue beta blocker  CKD Stage III:   Creat stable.  Follow as above.   SEIZURES:  He had a slightly low phenytoin level on admissionbut it is near therapeutic and up from previous.  I will continue the same dose of phenytoin.    Jeneen Rinks [facility] 10/06/2015 10:11 AM

## 2015-10-07 LAB — BASIC METABOLIC PANEL
Anion gap: 11 (ref 5–15)
BUN: 31 mg/dL — AB (ref 6–20)
CALCIUM: 8.7 mg/dL — AB (ref 8.9–10.3)
CHLORIDE: 99 mmol/L — AB (ref 101–111)
CO2: 27 mmol/L (ref 22–32)
CREATININE: 1.4 mg/dL — AB (ref 0.61–1.24)
GFR calc Af Amer: 59 mL/min — ABNORMAL LOW (ref >60)
GFR calc non Af Amer: 51 mL/min — ABNORMAL LOW (ref >60)
GLUCOSE: 88 mg/dL (ref 65–99)
Potassium: 4 mmol/L (ref 3.5–5.1)
Sodium: 137 mmol/L (ref 135–145)

## 2015-10-07 LAB — PROTIME-INR
INR: 1.69 — ABNORMAL HIGH (ref 0.00–1.49)
Prothrombin Time: 19.9 seconds — ABNORMAL HIGH (ref 11.6–15.2)

## 2015-10-07 MED ORDER — MAGNESIUM HYDROXIDE 400 MG/5ML PO SUSP
30.0000 mL | Freq: Every day | ORAL | Status: DC | PRN
  Filled 2015-10-07: qty 30

## 2015-10-07 MED ORDER — MILRINONE IN DEXTROSE 20 MG/100ML IV SOLN
0.2500 ug/kg/min | INTRAVENOUS | Status: DC
  Administered 2015-10-07 – 2015-10-11 (×8): 0.25 ug/kg/min via INTRAVENOUS
  Filled 2015-10-07 (×9): qty 100

## 2015-10-07 MED ORDER — WARFARIN SODIUM 7.5 MG PO TABS
7.5000 mg | ORAL_TABLET | Freq: Once | ORAL | Status: AC
  Administered 2015-10-07: 7.5 mg via ORAL
  Filled 2015-10-07: qty 1

## 2015-10-07 NOTE — Progress Notes (Signed)
NOTIFIED BY CMT THAT PATIENT HAD 27 BEAT RUN OF V-TACH. PATIENT ASLEEP AND ASYMPTOMATIC. VITALS WNL AT HIS BASELINE. MD ON CALL TEXT PAGED, AWAITING CALL BACK. WILL CONTINUE TO MONITOR. Blood pressure 98/59, pulse 89, temperature 98 F (36.7 C), temperature source Oral, resp. rate 21, height 5\' 11"  (1.803 m), weight 98.6 kg (217 lb 6 oz), SpO2 99 %. Tresa Endo

## 2015-10-07 NOTE — Progress Notes (Signed)
    SUBJECTIVE:   The milrinone was stopped last night because of a report of 27 beats of Vtach.  However, I reviewed the strips and this was paced rhythm.   He is still SOB this morning although he felt better yesterday.    PHYSICAL EXAM Filed Vitals:   10/06/15 2027 10/07/15 0023 10/07/15 0330 10/07/15 0440  BP: 96/70 94/78 98/59    Pulse: 84 88 89   Temp: 98 F (36.7 C) 98.2 F (36.8 C) 98 F (36.7 C)   TempSrc: Oral Oral Oral   Resp: 18 20 21    Height:      Weight:    238 lb 9.6 oz (108.228 kg)  SpO2: 100% 100% 99%    General:  No distress Lungs:  Clear Heart:  Irregular, JVD to jaw at 45 degrees.  Abdomen:  Positive bowel sounds, no rebound no guarding Extremities:  No edema in legs, right arm and hand swollen.  Neuro:  Nonfocal  LABS: Lab Results  Component Value Date   TROPONINI <0.03 10/05/2015   Results for orders placed or performed during the hospital encounter of 10/04/15 (from the past 24 hour(s))  Basic metabolic panel     Status: Abnormal   Collection Time: 10/07/15  4:48 AM  Result Value Ref Range   Sodium 137 135 - 145 mmol/L   Potassium 4.0 3.5 - 5.1 mmol/L   Chloride 99 (L) 101 - 111 mmol/L   CO2 27 22 - 32 mmol/L   Glucose, Bld 88 65 - 99 mg/dL   BUN 31 (H) 6 - 20 mg/dL   Creatinine, Ser 1.40 (H) 0.61 - 1.24 mg/dL   Calcium 8.7 (L) 8.9 - 10.3 mg/dL   GFR calc non Af Amer 51 (L) >60 mL/min   GFR calc Af Amer 59 (L) >60 mL/min   Anion gap 11 5 - 15  Protime-INR     Status: Abnormal   Collection Time: 10/07/15  4:48 AM  Result Value Ref Range   Prothrombin Time 19.9 (H) 11.6 - 15.2 seconds   INR 1.69 (H) 0.00 - 1.49    Intake/Output Summary (Last 24 hours) at 10/07/15 0739 Last data filed at 10/07/15 0440  Gross per 24 hour  Intake 1173.62 ml  Output   1875 ml  Net -701.38 ml    ASSESSMENT AND PLAN:  ACUTE ON CHRONIC SYSTOLIC AND DIASTOLIC HF:    Echo EF 24-40%.  He felt better yesterday on milrinone.  I will restart this.  I would  suggest continuing this over the weekend.  Possibly home on Monday.   CAD:  Enzymes negative.  I am not suspecting an acute cardiac event.  No ischemic work up is planned at this point.   HTN:   BP is low.  I would like to use Bidil if his BP allows.  BP will not allow med titration.   ATRIAL FIB: INR is now back down from supratherapeutic level.  Pharmacy is dosing.    CKD Stage III:   Creat stable.  Follow as above.   SEIZURES:  He had a slightly low phenytoin level on admission but it is near therapeutic and up from previous.  I have continued the same dose of phenytoin.    RIGHT ARM SWELLING:  This has been chronic.  I will ask for right arm ultrasound to rule out subclavian obstruction.    Minus Breeding 10/07/2015 7:39 AM

## 2015-10-07 NOTE — Progress Notes (Signed)
ANTICOAGULATION CONSULT NOTE - Follow Up Consult  Pharmacy Consult for Coumadin Indication: atrial fibrillation  No Known Allergies  Patient Measurements: Height: 5\' 11"  (180.3 cm) Weight: 238 lb 9.6 oz (108.228 kg) IBW/kg (Calculated) : 75.3  Vital Signs: Temp: 98 F (36.7 C) (10/14 1158) Temp Source: Oral (10/14 1158) BP: 93/58 mmHg (10/14 1512) Pulse Rate: 91 (10/14 1512)  Labs:  Recent Labs  10/04/15 1604 10/04/15 2214 10/05/15 0316 10/06/15 0443 10/07/15 0448  HGB 11.5*  --  11.6*  --   --   HCT 36.5*  --  36.3*  --   --   PLT 224  --  223  --   --   LABPROT 41.3*  --   --  24.9* 19.9*  INR 4.47*  --   --  2.28* 1.69*  CREATININE 1.49*  --  1.56* 1.37* 1.40*  TROPONINI <0.03 <0.03 <0.03  --   --     Estimated Creatinine Clearance: 65 mL/min (by C-G formula based on Cr of 1.4).  Assessment: On Coumadin for afib. INR up to 7.72 in the office on 10/7. Coumadin held for 6 days before INR down to 2.28 on 10/13. Coumadin resumed with usual dose of 5 mg on 10/13, but INR down to 1.69 today.   Home Coumadin dose: 5 mg daily  Goal of Therapy:  INR 2-3 Monitor platelets by anticoagulation protocol: Yes   Plan:   Increase Coumadin to 7.5 mg x 1 today.  Daily PT/INR.  Discussed with patient.  He mentioned taking 1/2 tablets on some days, and 2 tablets on some days, but most recent regimen is 5 mg daily per outpatient notes.  Wonder if he's taking it as directed.  May need to keep Coumadin diary.  Arty Baumgartner, [place] Pager: [phone] 10/07/2015,3:37 PM

## 2015-10-07 NOTE — Care Management Important Message (Signed)
Important Message  Patient Details  Name: [facility] MRN: [id number] Date of Birth: [birth year]   Medicare Important Message Given:  Yes-second notification given    [name] 10/07/2015, 10:59 AM

## 2015-10-08 ENCOUNTER — Inpatient Hospital Stay (HOSPITAL_COMMUNITY): Payer: Medicare Other

## 2015-10-08 DIAGNOSIS — R609 Edema, unspecified: Secondary | ICD-10-CM

## 2015-10-08 LAB — PROTIME-INR
INR: 1.68 — AB (ref 0.00–1.49)
PROTHROMBIN TIME: 19.8 s — AB (ref 11.6–15.2)

## 2015-10-08 MED ORDER — WARFARIN SODIUM 7.5 MG PO TABS
7.5000 mg | ORAL_TABLET | Freq: Once | ORAL | Status: AC
  Administered 2015-10-08: 7.5 mg via ORAL
  Filled 2015-10-08: qty 1

## 2015-10-08 NOTE — Progress Notes (Signed)
VASCULAR LAB PRELIMINARY  PRELIMINARY  PRELIMINARY  PRELIMINARY  Right upper extremity venous duplex completed.    Preliminary report:  There is no DVT or SVT noted in the right upper extremity.   Cambri Plourde, RVT 10/08/2015, 5:27 PM

## 2015-10-08 NOTE — Progress Notes (Signed)
ANTICOAGULATION CONSULT NOTE - Follow Up Consult  Pharmacy Consult for Coumadin Indication: atrial fibrillation  No Known Allergies  Patient Measurements: Height: 5\' 11"  (180.3 cm) Weight: 238 lb 5.1 oz (108.1 kg) IBW/kg (Calculated) : 75.3  Vital Signs: Temp: 98.6 F (37 C) (10/15 0736) Temp Source: Oral (10/15 0736) BP: 102/68 mmHg (10/15 0736) Pulse Rate: 98 (10/15 0736)  Labs:  Recent Labs  10/06/15 0443 10/07/15 0448 10/08/15 0414  LABPROT 24.9* 19.9* 19.8*  INR 2.28* 1.69* 1.68*  CREATININE 1.37* 1.40*  --     Estimated Creatinine Clearance: 64.9 mL/min (by C-G formula based on Cr of 1.4).  Assessment: On Coumadin for afib. INR up to 7.72 in the office on 10/7. Coumadin held for 6 days before INR down to 2.28 on 10/13. Coumadin resumed with usual dose of 5 mg on 10/13, but INR down to 1.68 today.   Home Coumadin dose: 5 mg daily  Goal of Therapy:  INR 2-3 Monitor platelets by anticoagulation protocol: Yes   Plan:  Repeat Coumadin 7.5 mg po x 1 today Daily INR  Thank you Anette Guarneri, PharmD [phone]  10/08/2015,1:45 PM

## 2015-10-08 NOTE — Progress Notes (Signed)
Patient Name: Louis Ford. Date of Encounter: 10/08/2015     Active Problems:   CHF (congestive heart failure), NYHA class III ([place])    SUBJECTIVE  The patient was restarted on milrinone drip yesterday.  He feels better today.  He is somewhat less dyspneic in walking in room.  No chest pain.  He has had no further seizures.  He still has some tenderness in left chest where he fell with his seizure.  Blood pressure remains soft.  Not able to start BiDil yet.  CURRENT MEDS . carvedilol  25 mg Oral BID  . furosemide  40 mg Intravenous BID  . phenytoin  100 mg Oral TID  . sodium chloride  3 mL Intravenous Q12H  . Warfarin - Pharmacist Dosing Inpatient   Does not apply q1800    OBJECTIVE  Filed Vitals:   10/07/15 2046 10/08/15 0013 10/08/15 0527 10/08/15 0736  BP: 104/62 90/62 109/77 102/68  Pulse: 111 82 84 98  Temp: 97.9 F (36.6 C) 98.6 F (37 C) 97.5 F (36.4 C) 98.6 F (37 C)  TempSrc: Oral Oral Oral Oral  Resp: 18 18 18 18   Height:      Weight:   238 lb 5.1 oz (108.1 kg)   SpO2: 96% 99% 98% 100%    Intake/Output Summary (Last 24 hours) at 10/08/15 0829 Last data filed at 10/08/15 0600  Gross per 24 hour  Intake 1065.46 ml  Output   3125 ml  Net -2059.54 ml   Filed Weights   10/06/15 0447 10/07/15 0440 10/08/15 0527  Weight: 217 lb 6 oz (98.6 kg) 238 lb 9.6 oz (108.228 kg) 238 lb 5.1 oz (108.1 kg)    PHYSICAL EXAM  General: Pleasant, NAD. Neuro: Alert and oriented X 3. Moves all extremities spontaneously. Psych: Normal affect. HEENT:  Normal  Neck: Supple without bruits.  Jugular venous pressure is elevated Lungs:  Resp regular and unlabored, CTA. Heart: RRR no s3, s4, or murmurs.  ICD in right chest Abdomen: Soft, non-tender, non-distended, BS + x 4.  Extremities: There is mild pretibial edema  Accessory Clinical Findings  CBC No results for input(s): WBC, NEUTROABS, HGB, HCT, MCV, PLT in the last 72 hours. Basic Metabolic Panel  Recent  Labs  10/06/15 0443 10/07/15 0448  NA 133* 137  K 3.8 4.0  CL 101 99*  CO2 25 27  GLUCOSE 119* 88  BUN 31* 31*  CREATININE 1.37* 1.40*  CALCIUM 8.0* 8.7*   Liver Function Tests No results for input(s): AST, ALT, ALKPHOS, BILITOT, PROT, ALBUMIN in the last 72 hours. No results for input(s): LIPASE, AMYLASE in the last 72 hours. Cardiac Enzymes No results for input(s): CKTOTAL, CKMB, CKMBINDEX, TROPONINI in the last 72 hours. BNP Invalid input(s): POCBNP D-Dimer No results for input(s): DDIMER in the last 72 hours. Hemoglobin A1C No results for input(s): HGBA1C in the last 72 hours. Fasting Lipid Panel No results for input(s): CHOL, HDL, LDLCALC, TRIG, CHOLHDL, LDLDIRECT in the last 72 hours. Thyroid Function Tests No results for input(s): TSH, T4TOTAL, T3FREE, THYROIDAB in the last 72 hours.  Invalid input(s): FREET3  TELE  Atrial fibrillation with controlled ventricular response  ECG  Atrial fibrillation with controlled ventricular response  Radiology/Studies  Dg Chest 2 View  10/04/2015  CLINICAL DATA:  Dyspnea, left-sided chest pain with cough EXAM: CHEST  2 VIEW COMPARISON:  09/26/2015 FINDINGS: Severe cardiac silhouette enlargement stable. Stable cardiac defibrillator. Mild central vascular congestion with no evidence of pulmonary edema.  No infiltrate or effusion. Mild left lower lobe atelectasis. Stable left pericardial calcification. IMPRESSION: No active cardiopulmonary disease. Stable severe cardiac silhouette enlargement. Electronically Signed   By: Skipper Cliche M.D.   On: 10/04/2015 17:07   Dg Chest 2 View  09/26/2015  CLINICAL DATA:  Seizure.  Chronic systolic heart failure. EXAM: CHEST  2 VIEW COMPARISON:  09/28/2012. FINDINGS: There is unchanged cardiomegaly. There are grossly intact appearances of the transvenous cardiac lead. Pericardial calcifications again noted. The lungs are clear except for minor chronic appearing interstitial coarsening. There is  no pleural effusion. Pulmonary vasculature is normal. IMPRESSION: Cardiomegaly.  No acute cardiopulmonary findings. Electronically Signed   By: Andreas Newport M.D.   On: 09/26/2015 03:27   Ct Head Wo Contrast  09/26/2015  CLINICAL DATA:  Suspected seizure; found on floor. Shortness of breath. Initial encounter. EXAM: CT HEAD WITHOUT CONTRAST TECHNIQUE: Contiguous axial images were obtained from the base of the skull through the vertex without intravenous contrast. COMPARISON:  CT of the head performed 10/04/2012 FINDINGS: There is no evidence of acute infarction, mass lesion, or intra- or extra-axial hemorrhage on CT. Scattered periventricular and subcortical white matter change likely reflects small vessel ischemic microangiopathy. Chronic ischemic change is noted at the left external capsule. The posterior fossa, including the cerebellum, brainstem and fourth ventricle, is within normal limits. The third and lateral ventricles are unremarkable in appearance. The cerebral hemispheres are symmetric in appearance, with normal gray-white differentiation. No mass effect or midline shift is seen. There is no evidence of fracture; visualized osseous structures are unremarkable in appearance. The orbits are within normal limits. There is partial opacification of the left maxillary sinus. The remaining paranasal sinuses and mastoid air cells are well-aerated. No significant soft tissue abnormalities are seen. IMPRESSION: 1. No acute intracranial pathology seen on CT. 2. Scattered small vessel ischemic microangiopathy. Chronic ischemic change at the left external capsule. 3. Partial opacification of the left maxillary sinus. Electronically Signed   By: Garald Balding M.D.   On: 09/26/2015 03:48    ASSESSMENT AND PLAN  ACUTE ON CHRONIC SYSTOLIC AND DIASTOLIC HF: Echo EF 95-09%. Continue IV milrinone over the weekend.  Continue IV Lasix.  Consider switch to oral Lasix tomorrow.   CAD: Enzymes negative. I am  not suspecting an acute cardiac event. No ischemic work up is planned at this point.   HTN: BP is low. I would like to use Bidil if his BP allows. BP will not allow med titration yet   ATRIAL FIB: His warfarin has been restarted. Continue beta blocker  CKD Stage III: Creat stable. Follow as above.  Recheck labs in a.m.  SEIZURES: He had a slightly low phenytoin level on admissionbut it is near therapeutic and up from previous. I will continue the same dose of phenytoin.He has had no further seizures.   Signed, Darlin Coco MD

## 2015-10-09 DIAGNOSIS — I5043 Acute on chronic combined systolic (congestive) and diastolic (congestive) heart failure: Secondary | ICD-10-CM

## 2015-10-09 LAB — BASIC METABOLIC PANEL
ANION GAP: 10 (ref 5–15)
BUN: 32 mg/dL — ABNORMAL HIGH (ref 6–20)
CALCIUM: 8.3 mg/dL — AB (ref 8.9–10.3)
CHLORIDE: 95 mmol/L — AB (ref 101–111)
CO2: 24 mmol/L (ref 22–32)
Creatinine, Ser: 1.3 mg/dL — ABNORMAL HIGH (ref 0.61–1.24)
GFR calc non Af Amer: 56 mL/min — ABNORMAL LOW (ref >60)
GLUCOSE: 89 mg/dL (ref 65–99)
POTASSIUM: 4.6 mmol/L (ref 3.5–5.1)
Sodium: 129 mmol/L — ABNORMAL LOW (ref 135–145)

## 2015-10-09 LAB — PROTIME-INR
INR: 1.91 — ABNORMAL HIGH (ref 0.00–1.49)
PROTHROMBIN TIME: 21.8 s — AB (ref 11.6–15.2)

## 2015-10-09 MED ORDER — WARFARIN SODIUM 5 MG PO TABS
5.0000 mg | ORAL_TABLET | Freq: Once | ORAL | Status: AC
  Administered 2015-10-09: 5 mg via ORAL
  Filled 2015-10-09: qty 1

## 2015-10-09 MED ORDER — FUROSEMIDE 40 MG PO TABS
40.0000 mg | ORAL_TABLET | Freq: Two times a day (BID) | ORAL | Status: DC
  Administered 2015-10-09 – 2015-10-11 (×5): 40 mg via ORAL
  Filled 2015-10-09 (×5): qty 1

## 2015-10-09 NOTE — Progress Notes (Signed)
ANTICOAGULATION CONSULT NOTE - Follow Up Consult  Pharmacy Consult for Coumadin Indication: atrial fibrillation  No Known Allergies  Patient Measurements: Height: 5\' 11"  (180.3 cm) Weight: 240 lb 6.4 oz (109.045 kg) IBW/kg (Calculated) : 75.3  Vital Signs: Temp: 98.4 F (36.9 C) (10/16 1200) Temp Source: Oral (10/16 1200) BP: 98/63 mmHg (10/16 1200) Pulse Rate: 85 (10/16 1200)  Labs:  Recent Labs  10/07/15 0448 10/08/15 0414 10/09/15 0526  LABPROT 19.9* 19.8* 21.8*  INR 1.69* 1.68* 1.91*  CREATININE 1.40*  --  1.30*    Estimated Creatinine Clearance: 70.2 mL/min (by C-G formula based on Cr of 1.3).  Assessment: On Coumadin 5mg  daily PTA for afib.Was on hold on admit due to supratherapeutic INR. Resumed on 10/13 when INR 2.28 and fell to 1.68. After 3 days INR is back up to 1.91. Noted large hematoma over his left chest d/t fell pta. Pt reports that it's ok. Last CBC ok on 10/11. No s/s of bleed.  Goal of Therapy:  INR 2-3 Monitor platelets by anticoagulation protocol: Yes   Plan:  Give Coumadin 5mg  PO x 1 today Monitor daily INR, CBC tomorrow, s/s of bleed  Elenor Quinones, PharmD Clinical Pharmacist Pager [phone] 10/09/2015 12:16 PM

## 2015-10-09 NOTE — Progress Notes (Signed)
Patient Name: [facility]. Date of Encounter: 10/09/2015     Active Problems:   CHF (congestive heart failure), NYHA class III ([place])    SUBJECTIVE  The patient remains on IV milrinone.  He has not been having any episodes of acute dyspnea.  He states he has had a good urine output overnight.  Intake and outputs show negative fluid balance.  His weight is up 2 pounds overnight.  His blood pressure remains low.  Most recently 98/65 this morning.  He is asymptomatic from his low blood pressure.  CURRENT MEDS . carvedilol  25 mg Oral BID  . furosemide  40 mg Intravenous BID  . phenytoin  100 mg Oral TID  . sodium chloride  3 mL Intravenous Q12H  . Warfarin - Pharmacist Dosing Inpatient   Does not apply q1800    OBJECTIVE  Filed Vitals:   10/08/15 2002 10/09/15 0019 10/09/15 0532 10/09/15 0823  BP: 116/84 115/83 102/68 98/65  Pulse: 64 91 84 90  Temp: 98.8 F (37.1 C) 98.6 F (37 C) 98.2 F (36.8 C) 98.3 F (36.8 C)  TempSrc: Oral Oral Oral Oral  Resp: 18 18 18    Height:      Weight:   240 lb 6.4 oz (109.045 kg)   SpO2: 100% 99% 99% 100%    Intake/Output Summary (Last 24 hours) at 10/09/15 0858 Last data filed at 10/09/15 0831  Gross per 24 hour  Intake 1514.4 ml  Output   2250 ml  Net -735.6 ml   Filed Weights   10/07/15 0440 10/08/15 0527 10/09/15 0532  Weight: 238 lb 9.6 oz (108.228 kg) 238 lb 5.1 oz (108.1 kg) 240 lb 6.4 oz (109.045 kg)    PHYSICAL EXAM  General: Pleasant, NAD. Neuro: Alert and oriented X 3. Moves all extremities spontaneously. Psych: Normal affect. HEENT:  Normal  Neck: Supple without bruits or JVD. Lungs:  Resp regular and unlabored, CTA. Heart: Irregularly irregular.  He has a tender bruise over his left pectoral area.  This is from his recent fall.  There is a faint systolic ejection murmur at the left upper sternal border.  No gallop or rub Abdomen: Soft, non-tender, non-distended, BS + x 4.  Extremities: No clubbing, cyanosis.   There is mild pretibial edema DP/PT/Radials 2+ and equal bilaterally.  Accessory Clinical Findings  CBC No results for input(s): WBC, NEUTROABS, HGB, HCT, MCV, PLT in the last 72 hours. Basic Metabolic Panel  Recent Labs  10/07/15 0448 10/09/15 0526  NA 137 129*  K 4.0 4.6  CL 99* 95*  CO2 27 24  GLUCOSE 88 89  BUN 31* 32*  CREATININE 1.40* 1.30*  CALCIUM 8.7* 8.3*   Liver Function Tests No results for input(s): AST, ALT, ALKPHOS, BILITOT, PROT, ALBUMIN in the last 72 hours. No results for input(s): LIPASE, AMYLASE in the last 72 hours. Cardiac Enzymes No results for input(s): CKTOTAL, CKMB, CKMBINDEX, TROPONINI in the last 72 hours. BNP Invalid input(s): POCBNP D-Dimer No results for input(s): DDIMER in the last 72 hours. Hemoglobin A1C No results for input(s): HGBA1C in the last 72 hours. Fasting Lipid Panel No results for input(s): CHOL, HDL, LDLCALC, TRIG, CHOLHDL, LDLDIRECT in the last 72 hours. Thyroid Function Tests No results for input(s): TSH, T4TOTAL, T3FREE, THYROIDAB in the last 72 hours.  Invalid input(s): FREET3  TELE  Atrial fibrillation with controlled ventricular response.  ECG    Radiology/Studies  Dg Chest 2 View  10/04/2015  CLINICAL DATA:  Dyspnea,  left-sided chest pain with cough EXAM: CHEST  2 VIEW COMPARISON:  09/26/2015 FINDINGS: Severe cardiac silhouette enlargement stable. Stable cardiac defibrillator. Mild central vascular congestion with no evidence of pulmonary edema. No infiltrate or effusion. Mild left lower lobe atelectasis. Stable left pericardial calcification. IMPRESSION: No active cardiopulmonary disease. Stable severe cardiac silhouette enlargement. Electronically Signed   By: Skipper Cliche M.D.   On: 10/04/2015 17:07   Dg Chest 2 View  09/26/2015  CLINICAL DATA:  Seizure.  Chronic systolic heart failure. EXAM: CHEST  2 VIEW COMPARISON:  09/28/2012. FINDINGS: There is unchanged cardiomegaly. There are grossly intact  appearances of the transvenous cardiac lead. Pericardial calcifications again noted. The lungs are clear except for minor chronic appearing interstitial coarsening. There is no pleural effusion. Pulmonary vasculature is normal. IMPRESSION: Cardiomegaly.  No acute cardiopulmonary findings. Electronically Signed   By: Andreas Newport M.D.   On: 09/26/2015 03:27   Ct Head Wo Contrast  09/26/2015  CLINICAL DATA:  Suspected seizure; found on floor. Shortness of breath. Initial encounter. EXAM: CT HEAD WITHOUT CONTRAST TECHNIQUE: Contiguous axial images were obtained from the base of the skull through the vertex without intravenous contrast. COMPARISON:  CT of the head performed 10/04/2012 FINDINGS: There is no evidence of acute infarction, mass lesion, or intra- or extra-axial hemorrhage on CT. Scattered periventricular and subcortical white matter change likely reflects small vessel ischemic microangiopathy. Chronic ischemic change is noted at the left external capsule. The posterior fossa, including the cerebellum, brainstem and fourth ventricle, is within normal limits. The third and lateral ventricles are unremarkable in appearance. The cerebral hemispheres are symmetric in appearance, with normal gray-white differentiation. No mass effect or midline shift is seen. There is no evidence of fracture; visualized osseous structures are unremarkable in appearance. The orbits are within normal limits. There is partial opacification of the left maxillary sinus. The remaining paranasal sinuses and mastoid air cells are well-aerated. No significant soft tissue abnormalities are seen. IMPRESSION: 1. No acute intracranial pathology seen on CT. 2. Scattered small vessel ischemic microangiopathy. Chronic ischemic change at the left external capsule. 3. Partial opacification of the left maxillary sinus. Electronically Signed   By: Garald Balding M.D.   On: 09/26/2015 03:48    ASSESSMENT AND PLAN ACUTE ON CHRONIC SYSTOLIC  AND DIASTOLIC HF: Echo EF 72-09%. Continue IV milrinone over the weekend.We will switch to oral Lasix today  CAD: Enzymes negative. I am not suspecting an acute cardiac event. No ischemic work up is planned at this point.   HTN: BP is low. I would like to use Bidil if his BP allows. BP will not allow med titration yet   ATRIAL FIB: His warfarin has been restarted. Continue beta blocker  CKD Stage III: Creat stable. Follow as above. Recheck labs in a.m.  SEIZURES: He had a slightly low phenytoin level on admission but it is near therapeutic and up from previous. I will continue the same dose of phenytoin.He has had no further seizures.   Signed, Darlin Coco MD

## 2015-10-09 NOTE — Progress Notes (Signed)
Notified by nurse about complaint of bump on the anterior lateral side of L leg and also back of L leg, current on coumadin, INR therapeutic on 10/13, subtherapeutic on 10/14 and 10/15, today INR 1.9. Location of pain with knot on the anterolateral side atypical for DVT, slightly sore to touch. Will hold off on venous doppler as it won't change medical therapy and symptom atypical.   Signed, Almyra Deforest PA Pager: [phone]

## 2015-10-10 ENCOUNTER — Ambulatory Visit (INDEPENDENT_AMBULATORY_CARE_PROVIDER_SITE_OTHER): Payer: Medicare Other | Admitting: *Deleted

## 2015-10-10 DIAGNOSIS — I255 Ischemic cardiomyopathy: Secondary | ICD-10-CM

## 2015-10-10 LAB — CUP PACEART REMOTE DEVICE CHECK
HighPow Impedance: 52 Ohm
Implantable Lead Implant Date: 20060927
Implantable Lead Location: 753860
Lead Channel Impedance Value: 468 Ohm
Lead Channel Sensing Intrinsic Amplitude: 12.5 mV
Lead Channel Setting Pacing Amplitude: 2 V
MDC IDC LEAD SERIAL: 10260064
MDC IDC MSMT BATTERY REMAINING PERCENTAGE: 51 %
MDC IDC MSMT LEADCHNL RV PACING THRESHOLD AMPLITUDE: 0.5 V
MDC IDC MSMT LEADCHNL RV PACING THRESHOLD PULSEWIDTH: 0.4 ms
MDC IDC SESS DTM: 20161017122114
MDC IDC SET LEADCHNL RV PACING PULSEWIDTH: 0.4 ms
MDC IDC SET LEADCHNL RV SENSING SENSITIVITY: 0.8 mV
MDC IDC SET ZONE DETECTION INTERVAL: 260 ms
MDC IDC STAT BRADY RV PERCENT PACED: 0 %
Pulse Gen Serial Number: 60613417
Zone Setting Detection Interval: 400 ms

## 2015-10-10 LAB — PROTIME-INR
INR: 2.32 — ABNORMAL HIGH (ref 0.00–1.49)
Prothrombin Time: 25.2 seconds — ABNORMAL HIGH (ref 11.6–15.2)

## 2015-10-10 LAB — CBC
HCT: 37.7 % — ABNORMAL LOW (ref 39.0–52.0)
HEMOGLOBIN: 12 g/dL — AB (ref 13.0–17.0)
MCH: 28.4 pg (ref 26.0–34.0)
MCHC: 31.8 g/dL (ref 30.0–36.0)
MCV: 89.1 fL (ref 78.0–100.0)
PLATELETS: 253 10*3/uL (ref 150–400)
RBC: 4.23 MIL/uL (ref 4.22–5.81)
RDW: 16.5 % — ABNORMAL HIGH (ref 11.5–15.5)
WBC: 5.4 10*3/uL (ref 4.0–10.5)

## 2015-10-10 LAB — BASIC METABOLIC PANEL
Anion gap: 8 (ref 5–15)
BUN: 31 mg/dL — ABNORMAL HIGH (ref 6–20)
CALCIUM: 8.6 mg/dL — AB (ref 8.9–10.3)
CHLORIDE: 94 mmol/L — AB (ref 101–111)
CO2: 28 mmol/L (ref 22–32)
CREATININE: 1.45 mg/dL — AB (ref 0.61–1.24)
GFR calc Af Amer: 56 mL/min — ABNORMAL LOW (ref >60)
GFR calc non Af Amer: 49 mL/min — ABNORMAL LOW (ref >60)
GLUCOSE: 95 mg/dL (ref 65–99)
Potassium: 4.4 mmol/L (ref 3.5–5.1)
Sodium: 130 mmol/L — ABNORMAL LOW (ref 135–145)

## 2015-10-10 MED ORDER — WARFARIN SODIUM 2.5 MG PO TABS
2.5000 mg | ORAL_TABLET | Freq: Once | ORAL | Status: AC
  Administered 2015-10-10: 2.5 mg via ORAL
  Filled 2015-10-10: qty 1

## 2015-10-10 MED ORDER — LISINOPRIL 2.5 MG PO TABS
2.5000 mg | ORAL_TABLET | Freq: Every day | ORAL | Status: DC
  Administered 2015-10-10 – 2015-10-11 (×2): 2.5 mg via ORAL
  Filled 2015-10-10 (×2): qty 1

## 2015-10-10 NOTE — Progress Notes (Signed)
Remote ICD transmission.   

## 2015-10-10 NOTE — Progress Notes (Addendum)
Patient Profile: 69 yo male with history of CAD s/p CABG, ischemic cardiomyopathy s/p ICD, chronic systolic CHF with EF of 31-49%, HTN, HLD, atrial fibrillation, previous stroke, Factor 7 deficiency and seizure disorder who was admitted for acute on chronic systolic CHF and atrial fibrillation w/ RVR.   Subjective: Still with dyspnea with light exertion (walking around room) and with orthopnea. No significant dyspnea at rest in upright position. Complains of atypical chest pain c/w musculoskeletal pain (pleuritic, exacerbated by cough, reproducible with palpation of chest wall). Denies any seizures since admission.   Objective: Vital signs in last 24 hours: Temp:  [97.8 F (36.6 C)-98.4 F (36.9 C)] 98 F (36.7 C) (10/17 0420) Pulse Rate:  [79-85] 80 (10/17 0420) Resp:  [18] 18 (10/17 0420) BP: (91-104)/(63-69) 91/69 mmHg (10/17 0420) SpO2:  [99 %-100 %] 99 % (10/17 0420) Weight:  [234 lb 1.6 oz (106.187 kg)] 234 lb 1.6 oz (106.187 kg) (10/17 0420) Last BM Date: 10/08/15  Intake/Output from previous day: 10/16 0701 - 10/17 0700 In: 914.4 [P.O.:720; I.V.:194.4] Out: 1975 [Urine:1975] Intake/Output this shift:    Medications Current Facility-Administered Medications  Medication Dose Route Frequency Provider Last Rate Last Dose  . 0.9 %  sodium chloride infusion  250 mL Intravenous PRN Burnell Blanks, MD      . acetaminophen (TYLENOL) tablet 650 mg  650 mg Oral Q4H PRN Burnell Blanks, MD   650 mg at 10/10/15 0906  . carvedilol (COREG) tablet 25 mg  25 mg Oral BID Minus Breeding, MD   25 mg at 10/10/15 0902  . furosemide (LASIX) tablet 40 mg  40 mg Oral BID Darlin Coco, MD   40 mg at 10/10/15 0902  . magnesium hydroxide (MILK OF MAGNESIA) suspension 30 mL  30 mL Oral Daily PRN Burnell Blanks, MD      . milrinone [facility]) 20 MG/100ML (0.2 mg/mL) infusion  0.25 mcg/kg/min Intravenous Continuous Minus Breeding, MD 8.1 mL/hr at 10/10/15 0450 0.25  mcg/kg/min at 10/10/15 0450  . ondansetron (ZOFRAN) injection 4 mg  4 mg Intravenous Q6H PRN Burnell Blanks, MD      . phenytoin (DILANTIN) ER capsule 100 mg  100 mg Oral TID Burnell Blanks, MD   100 mg at 10/10/15 0902  . sodium chloride 0.9 % injection 3 mL  3 mL Intravenous Q12H Burnell Blanks, MD   3 mL at 10/09/15 2121  . sodium chloride 0.9 % injection 3 mL  3 mL Intravenous PRN Burnell Blanks, MD      . Warfarin - Pharmacist Dosing Inpatient   Does not apply q1800 Skeet Simmer, [facility]        PE: General appearance: alert, cooperative and no distress Neck: no carotid bruit and no JVD Lungs: clear to auscultation bilaterally Heart: irregular rthythm, regular rate Extremities: no LEE Pulses: 2+ and symmetric Skin: warm and dry Neurologic: Grossly normal  Lab Results:   Recent Labs  10/10/15 0315  WBC 5.4  HGB 12.0*  HCT 37.7*  PLT 253   BMET  Recent Labs  10/09/15 0526 10/10/15 0315  NA 129* 130*  K 4.6 4.4  CL 95* 94*  CO2 24 28  GLUCOSE 89 95  BUN 32* 31*  CREATININE 1.30* 1.45*  CALCIUM 8.3* 8.6*   PT/INR  Recent Labs  10/08/15 0414 10/09/15 0526 10/10/15 0315  LABPROT 19.8* 21.8* 25.2*  INR 1.68* 1.91* 2.32*    Assessment/Plan   Active Problems:   CHF (  congestive heart failure), NYHA class III ([place])   1. Acute on Chronic Systolic CHF: doesn't appear significantly volume overloaded on exam. Lungs are CTAB and no significant peripheral edema, however still with dyspnea with exertion. Continue Lasix, Coreg and milrinone.   2. Atrial Fibrillation:  Rate is well controlled on Coreg. On warfarin. INR is therapeutic at 2.32.   3. CAD: s/p prior CABG. Cardiac enzymes negative x 3. No anginal symptoms.   4. HTN: soft but stable. Tolerating 25 mg of Coreg + 40 mg of PO Lasix, both BID, ok. Lisinopril on hold.  5. Stage III CKD: bump in SCr overnight from 1.30--1.45 (1.49 on admit, 1.19 on 09/26/15). Lisinopril on hold.  Continue to monitor.   6. Seizures: He had a slightly low phenytoin level on admission but it is near therapeutic and up from previous. Denies any seizures since admission.     LOS: 6 days    Brittainy M. Rosita Fire, PA-C 10/10/2015 9:22 AM   Attending Note:   The patient was seen and examined.  Agree with assessment and plan as noted above.  Changes made to the above note as needed. Pt is still very short of breath. Currently on Milrinone. Will try low dose Lisinopril 2. 5 QD Consider adding Aldactone at some point.  Currenty, we are limited by his hypotension  Will consider getting a advanced Heart failure  consult    [name], [name], MD, [facility] 10/10/2015, 10:48 AM [address],  [place] Pager [phone]

## 2015-10-10 NOTE — Progress Notes (Signed)
ANTICOAGULATION CONSULT NOTE - Follow Up Consult  Pharmacy Consult for Coumadin Indication: atrial fibrillation  No Known Allergies  Patient Measurements: Height: 5\' 11"  (180.3 cm) Weight: 234 lb 1.6 oz (106.187 kg) (scale C) IBW/kg (Calculated) : 75.3  Vital Signs: Temp: 98 F (36.7 C) (10/17 0420) Temp Source: Oral (10/17 0420) BP: 91/69 mmHg (10/17 0420) Pulse Rate: 80 (10/17 0420)  Labs:  Recent Labs  10/08/15 0414 10/09/15 0526 10/10/15 0315  HGB  --   --  12.0*  HCT  --   --  37.7*  PLT  --   --  253  LABPROT 19.8* 21.8* 25.2*  INR 1.68* 1.91* 2.32*  CREATININE  --  1.30* 1.45*    Estimated Creatinine Clearance: 62.2 mL/min (by C-G formula based on Cr of 1.45).  Assessment: On Coumadin 5mg  daily PTA for afib.Was on hold on admit due to supratherapeutic INR. Resumed on 10/13 when INR 2.28 and fell to 1.68. Yesterday it increased to 1.91 and today is therapeutic at 2.32.  Noted large hematoma over his left chest d/t fell pta. Pt reports that it's ok. CBC stable. No s/s of bleed.  Of note, patient with bump on anterior lateral side of L leg and back of L leg. Atypical for DVT - will hold off on venous doppler for now.   PTA dose of warfarin: 5 mg daily - INR was supratherapeutic on 09/30/15 - patient has been on phenytoin for years but got a new prescription on 09/26/15 after breakthrough seizure. Level at that time was <2.5 so therefore likely noncompliant and the increase in INR is due to compliance with phenytoin as level now is 9.4 (10/05/15). I suspect patient will need a lower dose of warfarin at discharge.   Goal of Therapy:  INR 2-3 Monitor platelets by anticoagulation protocol: Yes   Plan:  Give Coumadin 2.5 mg PO x 1 today Monitor daily INR/CBC, s/s of bleed    Nicoletta Ba, PharmD, BCPS Clinical Pharmacist [phone]

## 2015-10-10 NOTE — Care Management Important Message (Signed)
Important Message  Patient Details  Name: [facility]. MRN: [id number] Date of Birth: June 29, 1946   Medicare Important Message Given:  Yes-third notification given    [name] 10/10/2015, 1:19 PM

## 2015-10-11 ENCOUNTER — Encounter: Payer: Self-pay | Admitting: Cardiology

## 2015-10-11 LAB — BASIC METABOLIC PANEL
Anion gap: 9 (ref 5–15)
BUN: 31 mg/dL — AB (ref 6–20)
CHLORIDE: 98 mmol/L — AB (ref 101–111)
CO2: 26 mmol/L (ref 22–32)
Calcium: 8.4 mg/dL — ABNORMAL LOW (ref 8.9–10.3)
Creatinine, Ser: 1.38 mg/dL — ABNORMAL HIGH (ref 0.61–1.24)
GFR calc Af Amer: 60 mL/min — ABNORMAL LOW (ref >60)
GFR calc non Af Amer: 52 mL/min — ABNORMAL LOW (ref >60)
GLUCOSE: 87 mg/dL (ref 65–99)
POTASSIUM: 4.3 mmol/L (ref 3.5–5.1)
SODIUM: 133 mmol/L — AB (ref 135–145)

## 2015-10-11 LAB — PROTIME-INR
INR: 2.57 — ABNORMAL HIGH (ref 0.00–1.49)
Prothrombin Time: 27.3 seconds — ABNORMAL HIGH (ref 11.6–15.2)

## 2015-10-11 MED ORDER — SODIUM CHLORIDE 0.9 % IV SOLN: 250.0000 mL | INTRAVENOUS | Status: DC | PRN

## 2015-10-11 MED ORDER — SODIUM CHLORIDE 0.9 % IJ SOLN: 3.0000 mL | INTRAMUSCULAR | Status: DC | PRN

## 2015-10-11 MED ORDER — SODIUM CHLORIDE 0.9 % IJ SOLN
3.0000 mL | Freq: Two times a day (BID) | INTRAMUSCULAR | Status: DC
  Administered 2015-10-11: 3 mL via INTRAVENOUS

## 2015-10-11 MED ORDER — SODIUM CHLORIDE 0.9 % IV SOLN
INTRAVENOUS | Status: DC
  Administered 2015-10-12: 04:00:00 via INTRAVENOUS

## 2015-10-11 MED ORDER — WARFARIN SODIUM 2.5 MG PO TABS
2.5000 mg | ORAL_TABLET | Freq: Once | ORAL | Status: AC
  Administered 2015-10-11: 2.5 mg via ORAL
  Filled 2015-10-11: qty 1

## 2015-10-11 NOTE — Progress Notes (Signed)
ANTICOAGULATION CONSULT NOTE - Follow Up Consult  Pharmacy Consult for Coumadin Indication: atrial fibrillation  No Known Allergies  Patient Measurements: Height: 5\' 11"  (180.3 cm) Weight: 234 lb 11.2 oz (106.459 kg) IBW/kg (Calculated) : 75.3  Vital Signs: Temp: 97.6 F (36.4 C) (10/18 0554) Temp Source: Oral (10/18 0554) BP: 89/56 mmHg (10/18 0554) Pulse Rate: 70 (10/18 0554)  Labs:  Recent Labs  10/09/15 0526 10/10/15 0315 10/11/15 0333  HGB  --  12.0*  --   HCT  --  37.7*  --   PLT  --  253  --   LABPROT 21.8* 25.2* 27.3*  INR 1.91* 2.32* 2.57*  CREATININE 1.30* 1.45* 1.38*    Estimated Creatinine Clearance: 65.4 mL/min (by C-G formula based on Cr of 1.38).  Assessment: 69 yo man admitted 10/04/2015 with SOB on warfarin 5mg  daily PTA for afib.Warfarin held on admit d/t supratherapeutic INR 4.47. Resumed on 10/13 with INR 2.28.   INR 2.57 therapeutic today. CBC stable. Bruising on L chest and knee d/t fall pta, no changes per pt. Possible interaction exists btwn phenytoin and warfarin. Phenytoin can cause elevation of INR acutely, however chronic use has opposite effect. Of note, warfarin can also cause displacement of phenytoin and can increase unbound plasma phenytoin concentrations.   Goal of Therapy:  INR 2-3 Monitor platelets by anticoagulation protocol: Yes   Plan:  Give Coumadin 2.5 mg PO x 1 today Daily INR Monitor s/sx bleeding  Phenytoin level at warfarin steady state    Heloise Ochoa, [place].D. PGY2 Cardiology Pharmacy Resident Pager: [phone] 10/11/2015 8:33 AM

## 2015-10-11 NOTE — Progress Notes (Signed)
Heart Failure Navigator Consult Note  Presentation: Rodrickus Min.  is a 69 y.o. male with hx of CAD s/p CABG, ICM s/p Biotronik ICD, chronic systolic CHF Echo 28/63/81 LVEF 30-35%; normal RV; PA peak pressure 34 mm Hg, HTN, HLD, atrial fibrillation, previous stroke, Factor 7 deficiency, and seizure disorder.   He presented to [facility] office on 10/04/15 with worsening SOB and Afib RVR and was admitted. He was noted to have had a 40 lb weight gain over the past year, although he has been stable for the past few months. He has been diuresing on IV lasix and transitioned to po without difficulty. Milrinone started on 10/06/15 with continued SOB. HF consulted for med management with hypotension and continued symptoms on Milrinone.   Out 450 cc and weight stable on po lasix. Says his breathing feels better than admit, but he is not being active. Chest is still sore from fall beginning of month with seizure. CXR x 2 negative. No seizures since then. Says he would occasionally get swelling at home, but would not take extra lasix unless directed. Drinks 3+ bottles of water with other drinks each day and has lots of salt (BBQ chips). Prior to this month could walk ~ 3 block round trip without difficulty. Since fall/seizure on 09/26/15 has had SOB walking to bathroom and back. No SOB at rest or PND. Chronic 3 pillow orthopnea.    Past Medical History  Diagnosis Date  . CAD (coronary artery disease)   . Chronic systolic heart failure ([place])   . Ischemic cardiomyopathy     EF 15 to 20% by TTE and TEE in 09/2012.  Severe LV dysfunction  . HTN (hypertension)   . Hyperlipidemia   . Obesity     BMI 31 in 09/2012  . Atrial fibrillation   09/22/2012  . implantable cardiac defibrillator-Biotronik     Device Implanted 2006; s/p gen change 03/2011 : bleeding persistent with pocket erosion and infection; explant and reimplant  06/2011  . Stroke ([place])   . 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 History of  MRSA infection 05/2011  . Seizure disorder ([place]) latest 09/30/2012  . 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 Seizures [facility])     Social History   Social History  . Marital Status: Single    Spouse Name: N/A  . Number of Children: N/A  . Years of Education: N/A   Social History Main Topics  . Smoking status: Former Smoker    Types: Pipe    Quit date: 09/21/2008  . Smokeless tobacco: Former Systems developer    Quit date: 09/21/2008  . Alcohol Use: No  . Drug Use: No  . Sexual Activity: No   Other Topics Concern  . None   Social History Narrative   ** Merged History Encounter **        ECHO:Study Conclusions--10/05/15  - Left ventricle: The cavity size was normal. Systolic function was moderately to severely reduced. The estimated ejection fraction was in the range of 30% to 35%. Wall motion was normal; there were no regional wall motion abnormalities. - Aortic valve: Poorly visualized. - Mitral valve: There was mild regurgitation. - Pulmonary arteries: PA peak pressure: 34 mm Hg (S).  ------------------------------------------------------------------- Labs, prior tests, procedures, and surgery: Currently implanted device: implantable cardioverter defibrillator.  Transthoracic echocardiography. M-mode, complete 2D, spectral Doppler, and color Doppler. Birthdate: Patient birthdate: 22-Oct-1946. Age: Patient is 69 yr old. Sex: Gender: male. BMI: 31.9 kg/m^2. Blood pressure:   90/62 Patient status:  Inpatient. Study date: Study date: 10/05/2015. Study time: 03:39 PM. Location: Bedside.  BNP    Component Value Date/Time   BNP 796.9* 10/04/2015 1604    ProBNP    Component Value Date/Time   PROBNP 3157.0* 09/22/2012 0328     Education Assessment and Provision:  Detailed education and instructions provided on heart failure disease management including the following:  Signs and symptoms of Heart Failure When to call the physician Importance of  daily weights Low sodium diet Fluid restriction Medication management Anticipated future follow-up appointments  Patient education given on each of the above topics.  Patient acknowledges understanding and acceptance of all instructions.  I spoke with Mr. Bonner and friend regarding his HF.  He tells me that he is going for cardiac cath tomorrow and relates that he is quite nervous about the procedure.  I tried to encourage him and briefly explained the procedure and purpose for it.  He currently does not weigh daily at home --we discussed the importance of daily weights and how they relate to the signs and symptoms of HF as well as when to contact the physician.  He lives with a "friend" in [place].  His friend tells me that they have recently began to "cook at home" more and eat out less to help decrease sodium consumption.  I reinforced a low sodium diet and high sodium foods to avoid.  The friend says that he has HF as well and can help educate Mr. Stumpe.  Mr. Kilner denies any issues with getting or taking medications.  He was going to the [facility] clinic as an outpatient prior to admission.  He will follow with the AHF Clinic after discharge.  Education Materials:  "Living Better With Heart Failure" Booklet, Daily Weight Tracker Tool    High Risk Criteria for Readmission and/or Poor Patient Outcomes:  (Recommend Follow-up with Advanced Heart Failure Clinic)--yes   EF <30%- No 30-35%  2 or more admissions in 6 months- 1/6  Difficult social situation- No  Demonstrates medication noncompliance- No- denies    Barriers of Care:  Knowledge and compliance  Discharge Planning:   Plans to return to home with "friend" in [place].  He would benefit from [facility] for ongoing education, symptom recognition and compliance reinforcement.

## 2015-10-11 NOTE — Progress Notes (Signed)
Advanced Heart Failure Rounding Note  PCP: Dr Elizabeth Palau Primary Cardiologist: Dr Lovena Le  Subjective:    [facility] Louis Ford. is a 69 y.o. male with hx of CAD s/p CABG, ICM s/p Biotronik ICD, chronic systolic CHF Echo 39/76/73 LVEF 30-35%; normal RV; PA peak pressure 34 mm Hg, HTN, HLD, atrial fibrillation, previous stroke, Factor 7 deficiency, and seizure disorder.    He presented to [facility] office on 10/04/15 with worsening SOB and Afib RVR and was admitted. He was noted to have had a 40 lb weight gain over the past year, although he has been stable for the past few months.  He has been diuresing on IV lasix and transitioned to po without difficulty. Milrinone started on 10/06/15 with continued SOB.  HF consulted for med management with hypotension and continued symptoms on Milrinone.   Out 450 cc and weight stable on po lasix.  Says his breathing feels better than admit, but he is not being active.  Chest is still sore from fall beginning of month with seizure. CXR x 2 negative.  No seizures since then.  Says he would occasionally get swelling at home, but would not take extra lasix unless directed.  Drinks 3+ bottles of water with other drinks each day and has lots of salt (BBQ chips).  Prior to this month could walk ~ 3 block round trip without difficulty.  Since fall/seizure on 09/26/15 has had SOB walking to bathroom and back.  No SOB at rest or PND. Chronic 3 pillow orthopnea.     Objective:   Weight Range: 234 lb 11.2 oz (106.459 kg) Body mass index is 32.75 kg/(m^2).   Vital Signs:   Temp:  [97.6 F (36.4 C)-98.4 F (36.9 C)] 97.6 F (36.4 C) (10/18 0554) Pulse Rate:  [70-88] 70 (10/18 0554) Resp:  [18-20] 20 (10/18 0554) BP: (87-102)/(56-65) 89/56 mmHg (10/18 0554) SpO2:  [97 %-100 %] 100 % (10/18 0554) Weight:  [234 lb 11.2 oz (106.459 kg)] 234 lb 11.2 oz (106.459 kg) (10/18 0554) Last BM Date: 10/08/15  Weight change: Filed Weights   10/09/15 0532 10/10/15 0420  10/11/15 0554  Weight: 240 lb 6.4 oz (109.045 kg) 234 lb 1.6 oz (106.187 kg) 234 lb 11.2 oz (106.459 kg)    Intake/Output:   Intake/Output Summary (Last 24 hours) at 10/11/15 0745 Last data filed at 10/11/15 0558  Gross per 24 hour  Intake 2015.8 ml  Output    950 ml  Net 1065.8 ml     Physical Exam: General: Long pauses between sentences and sometimes stops to breath in between. HEENT: normal Neck: supple. JVP 6 cm. Carotids 2+ bilat; no bruits. No lymphadenopathy or thyromegaly appreciated. Cor: PMI nondisplaced. Irregularly irregular.. No rubs, gallops or murmurs appreciated. Ecchymosis around L chest will small nodule. ICD R chest. Lungs: Diminished bibasilar Abdomen: soft, obese, NT, ND. No HSM. No bruits or masses. +BS Extremities: no cyanosis, clubbing, rash. No edema. <1cm nodule LLE, Non erythematous non edematous.  Neuro: alert & orientedx3, cranial nerves grossly intact. moves all 4 extremities w/o difficulty. Affect pleasant  Telemetry: Afib 80s  Labs: CBC  Recent Labs  10/10/15 0315  WBC 5.4  HGB 12.0*  HCT 37.7*  MCV 89.1  PLT 419   Basic Metabolic Panel  Recent Labs  10/10/15 0315 10/11/15 0333  NA 130* 133*  K 4.4 4.3  CL 94* 98*  CO2 28 26  GLUCOSE 95 87  BUN 31* 31*  CREATININE 1.45* 1.38*  CALCIUM 8.6*  8.4*   Liver Function Tests No results for input(s): AST, ALT, ALKPHOS, BILITOT, PROT, ALBUMIN in the last 72 hours. No results for input(s): LIPASE, AMYLASE in the last 72 hours. Cardiac Enzymes No results for input(s): CKTOTAL, CKMB, CKMBINDEX, TROPONINI in the last 72 hours.  BNP: BNP (last 3 results)  Recent Labs  10/04/15 1604  BNP 796.9*    ProBNP (last 3 results) No results for input(s): PROBNP in the last 8760 hours.   D-Dimer No results for input(s): DDIMER in the last 72 hours. Hemoglobin A1C No results for input(s): HGBA1C in the last 72 hours. Fasting Lipid Panel No results for input(s): CHOL, HDL, LDLCALC,  TRIG, CHOLHDL, LDLDIRECT in the last 72 hours. Thyroid Function Tests No results for input(s): TSH, T4TOTAL, T3FREE, THYROIDAB in the last 72 hours.  Invalid input(s): FREET3  Imaging/Studies:   No results found.   Medications:     Scheduled Medications: . carvedilol  25 mg Oral BID  . furosemide  40 mg Oral BID  . lisinopril  2.5 mg Oral Daily  . phenytoin  100 mg Oral TID  . sodium chloride  3 mL Intravenous Q12H  . Warfarin - Pharmacist Dosing Inpatient   Does not apply q1800     Infusions: . milrinone 0.25 mcg/kg/min (10/11/15 0710)     PRN Medications:  sodium chloride, acetaminophen, magnesium hydroxide, ondansetron (ZOFRAN) IV, sodium chloride   Assessment   1. Acute on Chronic Systolic CHF 2. Atrial Fibrillation 3. CAD 4. HTN 5. AKI on CKD stage III - Baseline 1.1 to 1.2 6. Seizures - on phenytoin  Plan   69 yo male with history of CAD s/p CABG, ischemic cardiomyopathy s/p ICD, chronic systolic CHF with EF of 09-81%, HTN, HLD, atrial fibrillation, previous stroke, Factor 7 deficiency and seizure disorder who was admitted for acute on chronic systolic CHF and atrial fibrillation w/ RVR.   Lisinopril added 10/10/15. BP remains soft in upper 80s.  Volume status stable. On Milrinone 0.25.  Has SOB with speaking.  Will get RHC this afternoon.  Nuclear study tomorrow.    Length of Stay: 7  Shirley Friar PA-C 10/11/2015, 7:45 AM  Advanced Heart Failure Team Pager [phone] (M-F; 7a - 4p)  Please contact [facility] for night-coverage after hours (4p -7a ) and weekends on [email or web address]   Patient seen and examined with Louis Kilts, PA-C. We discussed all aspects of the encounter. I agree with the assessment and plan as stated above.   Patient appears dry on exam but has gained over 40 pounds over past year and reports profound dyspnea on exertion. Will stop milrinone this am and proceed with RHC later today. Ideally would also do coronary  angio but is on coumadin for Factor 7 deficiency. Will get Myoview tomorrow. Decrease carvedilol.   [name],MD 9:51 AM

## 2015-10-12 ENCOUNTER — Encounter (HOSPITAL_COMMUNITY): Payer: Self-pay | Admitting: Internal Medicine

## 2015-10-12 ENCOUNTER — Encounter (HOSPITAL_COMMUNITY)
Admission: AD | Disposition: A | Discharge status: Skilled Nursing Facility | Payer: Medicare Other | Source: Ambulatory Visit | Attending: Cardiovascular Disease

## 2015-10-12 DIAGNOSIS — I5023 Acute on chronic systolic (congestive) heart failure: Secondary | ICD-10-CM | POA: Insufficient documentation

## 2015-10-12 DIAGNOSIS — N179 Acute kidney failure, unspecified: Secondary | ICD-10-CM

## 2015-10-12 DIAGNOSIS — N189 Chronic kidney disease, unspecified: Secondary | ICD-10-CM

## 2015-10-12 HISTORY — PX: CARDIAC CATHETERIZATION: SHX172

## 2015-10-12 LAB — PROTIME-INR
INR: 2.45 — AB (ref 0.00–1.49)
PROTHROMBIN TIME: 26.3 s — AB (ref 11.6–15.2)

## 2015-10-12 LAB — GLUCOSE, CAPILLARY: Glucose-Capillary: 79 mg/dL (ref 65–99)

## 2015-10-12 LAB — BASIC METABOLIC PANEL
ANION GAP: 8 (ref 5–15)
BUN: 36 mg/dL — ABNORMAL HIGH (ref 6–20)
CALCIUM: 8.9 mg/dL (ref 8.9–10.3)
CO2: 29 mmol/L (ref 22–32)
CREATININE: 1.64 mg/dL — AB (ref 0.61–1.24)
Chloride: 98 mmol/L — ABNORMAL LOW (ref 101–111)
GFR, EST AFRICAN AMERICAN: 49 mL/min — AB (ref >60)
GFR, EST NON AFRICAN AMERICAN: 42 mL/min — AB (ref >60)
GLUCOSE: 89 mg/dL (ref 65–99)
Potassium: 5.6 mmol/L — ABNORMAL HIGH (ref 3.5–5.1)
Sodium: 135 mmol/L (ref 135–145)

## 2015-10-12 SURGERY — RIGHT HEART CATH

## 2015-10-12 MED ORDER — SODIUM CHLORIDE 0.9 % IJ SOLN
3.0000 mL | Freq: Two times a day (BID) | INTRAMUSCULAR | Status: DC
  Administered 2015-10-12 – 2015-10-20 (×7): 3 mL via INTRAVENOUS

## 2015-10-12 MED ORDER — SODIUM CHLORIDE 0.9 % IV SOLN: 250.0000 mL | INTRAVENOUS | Status: DC | PRN

## 2015-10-12 MED ORDER — WARFARIN SODIUM 5 MG PO TABS
5.0000 mg | ORAL_TABLET | Freq: Once | ORAL | Status: AC
  Administered 2015-10-12: 5 mg via ORAL
  Filled 2015-10-12: qty 1

## 2015-10-12 MED ORDER — ONDANSETRON HCL 4 MG/2ML IJ SOLN: 4.0000 mg | Freq: Four times a day (QID) | INTRAMUSCULAR | Status: DC | PRN

## 2015-10-12 MED ORDER — FENTANYL CITRATE (PF) 100 MCG/2ML IJ SOLN
50.0000 ug | Freq: Once | INTRAMUSCULAR | Status: AC
  Administered 2015-10-12: 50 ug via INTRAVENOUS

## 2015-10-12 MED ORDER — SODIUM CHLORIDE 0.9 % IJ SOLN: 3.0000 mL | INTRAMUSCULAR | Status: DC | PRN

## 2015-10-12 MED ORDER — FENTANYL CITRATE (PF) 100 MCG/2ML IJ SOLN
INTRAMUSCULAR | Status: DC | PRN
  Administered 2015-10-12: 25 ug via INTRAVENOUS

## 2015-10-12 MED ORDER — FENTANYL CITRATE (PF) 100 MCG/2ML IJ SOLN
INTRAMUSCULAR | Status: AC
  Filled 2015-10-12: qty 4

## 2015-10-12 MED ORDER — SODIUM CHLORIDE 0.9 % IJ SOLN
3.0000 mL | Freq: Two times a day (BID) | INTRAMUSCULAR | Status: DC
  Administered 2015-10-13 – 2015-10-17 (×3): 3 mL via INTRAVENOUS

## 2015-10-12 MED ORDER — MIDAZOLAM HCL 2 MG/2ML IJ SOLN
INTRAMUSCULAR | Status: DC | PRN
  Administered 2015-10-12: 1 mg via INTRAVENOUS

## 2015-10-12 MED ORDER — SODIUM CHLORIDE 0.9 % IV SOLN
250.0000 mL | INTRAVENOUS | Status: DC | PRN
Start: 2015-10-12 — End: 2015-10-20

## 2015-10-12 MED ORDER — LIDOCAINE HCL (PF) 1 % IJ SOLN
INTRAMUSCULAR | Status: AC
  Filled 2015-10-12: qty 30

## 2015-10-12 MED ORDER — FENTANYL CITRATE (PF) 100 MCG/2ML IJ SOLN
INTRAMUSCULAR | Status: AC
  Filled 2015-10-12: qty 2

## 2015-10-12 MED ORDER — TRAMADOL HCL 50 MG PO TABS
50.0000 mg | ORAL_TABLET | Freq: Four times a day (QID) | ORAL | Status: DC | PRN
  Administered 2015-10-12 – 2015-10-19 (×7): 50 mg via ORAL
  Filled 2015-10-12 (×8): qty 1

## 2015-10-12 MED ORDER — MIDAZOLAM HCL 2 MG/2ML IJ SOLN
INTRAMUSCULAR | Status: AC
  Filled 2015-10-12: qty 4

## 2015-10-12 MED ORDER — MILRINONE IN DEXTROSE 20 MG/100ML IV SOLN
0.2500 ug/kg/min | INTRAVENOUS | Status: DC
  Administered 2015-10-12 – 2015-10-16 (×7): 0.25 ug/kg/min via INTRAVENOUS
  Filled 2015-10-12 (×9): qty 100

## 2015-10-12 MED ORDER — ACETAMINOPHEN 325 MG PO TABS: 650.0000 mg | ORAL_TABLET | ORAL | Status: DC | PRN

## 2015-10-12 SURGICAL SUPPLY — 9 items
CATH SWAN GANZ 7F STRAIGHT (CATHETERS) ×3 IMPLANT
COVER PRB 48X5XTLSCP FOLD TPE (BAG) ×1 IMPLANT
COVER PROBE 5X48 (BAG) ×2
KIT HEART RIGHT NAMIC (KITS) ×3 IMPLANT
PACK CARDIAC CATHETERIZATION (CUSTOM PROCEDURE TRAY) ×3 IMPLANT
SHEATH PINNACLE 7F 10CM (SHEATH) ×3 IMPLANT
SLEEVE REPOSITIONING LENGTH 30 (MISCELLANEOUS) ×3 IMPLANT
TRANSDUCER W/STOPCOCK (MISCELLANEOUS) ×3 IMPLANT
WIRE EMERALD 3MM-J .035X150CM (WIRE) ×3 IMPLANT

## 2015-10-12 NOTE — Progress Notes (Signed)
Patient with soreness in R neck after RHC. Also c/o hoarseness. No obvious hematoma. ? Injury/swelling to recurrent laryngeal nerve. Will continue to follow. If not improving can get neck CT.   [name], Daniel,MD 10:59 AM

## 2015-10-12 NOTE — H&P (View-Only) (Signed)
Advanced Heart Failure Rounding Note  PCP: Dr Elizabeth Palau Primary Cardiologist: Dr Lovena Le  Subjective:    [facility] Louis Ford. is a 69 y.o. male with hx of CAD s/p CABG, ICM s/p Biotronik ICD, chronic systolic CHF Echo 70/62/37 LVEF 30-35%; normal RV; PA peak pressure 34 mm Hg, HTN, HLD, atrial fibrillation, previous stroke, Factor 7 deficiency, and seizure disorder.    He presented to [facility] office on 10/04/15 with worsening SOB and Afib RVR and was admitted. He was noted to have had a 40 lb weight gain over the past year, although he has been stable for the past few months.  He has been diuresing on IV lasix and transitioned to po without difficulty. Milrinone started on 10/06/15 with continued SOB.  HF consulted for med management with hypotension and continued symptoms on Milrinone.   Out 450 cc and weight stable on po lasix.  Says his breathing feels better than admit, but he is not being active.  Chest is still sore from fall beginning of month with seizure. CXR x 2 negative.  No seizures since then.  Says he would occasionally get swelling at home, but would not take extra lasix unless directed.  Drinks 3+ bottles of water with other drinks each day and has lots of salt (BBQ chips).  Prior to this month could walk ~ 3 block round trip without difficulty.  Since fall/seizure on 09/26/15 has had SOB walking to bathroom and back.  No SOB at rest or PND. Chronic 3 pillow orthopnea.     Objective:   Weight Range: 234 lb 11.2 oz (106.459 kg) Body mass index is 32.75 kg/(m^2).   Vital Signs:   Temp:  [97.6 F (36.4 C)-98.4 F (36.9 C)] 97.6 F (36.4 C) (10/18 0554) Pulse Rate:  [70-88] 70 (10/18 0554) Resp:  [18-20] 20 (10/18 0554) BP: (87-102)/(56-65) 89/56 mmHg (10/18 0554) SpO2:  [97 %-100 %] 100 % (10/18 0554) Weight:  [234 lb 11.2 oz (106.459 kg)] 234 lb 11.2 oz (106.459 kg) (10/18 0554) Last BM Date: 10/08/15  Weight change: Filed Weights   10/09/15 0532 10/10/15 0420  10/11/15 0554  Weight: 240 lb 6.4 oz (109.045 kg) 234 lb 1.6 oz (106.187 kg) 234 lb 11.2 oz (106.459 kg)    Intake/Output:   Intake/Output Summary (Last 24 hours) at 10/11/15 0745 Last data filed at 10/11/15 0558  Gross per 24 hour  Intake 2015.8 ml  Output    950 ml  Net 1065.8 ml     Physical Exam: General: Long pauses between sentences and sometimes stops to breath in between. HEENT: normal Neck: supple. JVP 6 cm. Carotids 2+ bilat; no bruits. No lymphadenopathy or thyromegaly appreciated. Cor: PMI nondisplaced. Irregularly irregular.. No rubs, gallops or murmurs appreciated. Ecchymosis around L chest will small nodule. ICD R chest. Lungs: Diminished bibasilar Abdomen: soft, obese, NT, ND. No HSM. No bruits or masses. +BS Extremities: no cyanosis, clubbing, rash. No edema. <1cm nodule LLE, Non erythematous non edematous.  Neuro: alert & orientedx3, cranial nerves grossly intact. moves all 4 extremities w/o difficulty. Affect pleasant  Telemetry: Afib 80s  Labs: CBC  Recent Labs  10/10/15 0315  WBC 5.4  HGB 12.0*  HCT 37.7*  MCV 89.1  PLT 628   Basic Metabolic Panel  Recent Labs  10/10/15 0315 10/11/15 0333  NA 130* 133*  K 4.4 4.3  CL 94* 98*  CO2 28 26  GLUCOSE 95 87  BUN 31* 31*  CREATININE 1.45* 1.38*  CALCIUM 8.6*  8.4*   Liver Function Tests No results for input(s): AST, ALT, ALKPHOS, BILITOT, PROT, ALBUMIN in the last 72 hours. No results for input(s): LIPASE, AMYLASE in the last 72 hours. Cardiac Enzymes No results for input(s): CKTOTAL, CKMB, CKMBINDEX, TROPONINI in the last 72 hours.  BNP: BNP (last 3 results)  Recent Labs  10/04/15 1604  BNP 796.9*    ProBNP (last 3 results) No results for input(s): PROBNP in the last 8760 hours.   D-Dimer No results for input(s): DDIMER in the last 72 hours. Hemoglobin A1C No results for input(s): HGBA1C in the last 72 hours. Fasting Lipid Panel No results for input(s): CHOL, HDL, LDLCALC,  TRIG, CHOLHDL, LDLDIRECT in the last 72 hours. Thyroid Function Tests No results for input(s): TSH, T4TOTAL, T3FREE, THYROIDAB in the last 72 hours.  Invalid input(s): FREET3  Imaging/Studies:   No results found.   Medications:     Scheduled Medications: . carvedilol  25 mg Oral BID  . furosemide  40 mg Oral BID  . lisinopril  2.5 mg Oral Daily  . phenytoin  100 mg Oral TID  . sodium chloride  3 mL Intravenous Q12H  . Warfarin - Pharmacist Dosing Inpatient   Does not apply q1800     Infusions: . milrinone 0.25 mcg/kg/min (10/11/15 0710)     PRN Medications:  sodium chloride, acetaminophen, magnesium hydroxide, ondansetron (ZOFRAN) IV, sodium chloride   Assessment   1. Acute on Chronic Systolic CHF 2. Atrial Fibrillation 3. CAD 4. HTN 5. AKI on CKD stage III - Baseline 1.1 to 1.2 6. Seizures - on phenytoin  Plan   69 yo male with history of CAD s/p CABG, ischemic cardiomyopathy s/p ICD, chronic systolic CHF with EF of 76-81%, HTN, HLD, atrial fibrillation, previous stroke, Factor 7 deficiency and seizure disorder who was admitted for acute on chronic systolic CHF and atrial fibrillation w/ RVR.   Lisinopril added 10/10/15. BP remains soft in upper 80s.  Volume status stable. On Milrinone 0.25.  Has SOB with speaking.  Will get RHC this afternoon.  Nuclear study tomorrow.    Length of Stay: 7  Shirley Friar PA-C 10/11/2015, 7:45 AM  Advanced Heart Failure Team Pager [phone] (M-F; 7a - 4p)  Please contact [facility] for night-coverage after hours (4p -7a ) and weekends on [email or web address]   Patient seen and examined with Oda Kilts, PA-C. We discussed all aspects of the encounter. I agree with the assessment and plan as stated above.   Patient appears dry on exam but has gained over 40 pounds over past year and reports profound dyspnea on exertion. Will stop milrinone this am and proceed with RHC later today. Ideally would also do coronary  angio but is on coumadin for Factor 7 deficiency. Will get Myoview tomorrow. Decrease carvedilol.   Louis Denbleyker,MD 9:51 AM

## 2015-10-12 NOTE — Progress Notes (Signed)
ANTICOAGULATION CONSULT NOTE - Follow Up Consult  Pharmacy Consult for Coumadin Indication: atrial fibrillation  No Known Allergies  Patient Measurements: Height: 5\' 11"  (180.3 cm) Weight: 235 lb (106.595 kg) IBW/kg (Calculated) : 75.3  Vital Signs: Temp: 98.2 F (36.8 C) (10/19 1228) Temp Source: Oral (10/19 1228) BP: 109/76 mmHg (10/19 1230) Pulse Rate: 77 (10/19 1230)  Labs:  Recent Labs  10/10/15 0315 10/11/15 0333 10/12/15 0306  HGB 12.0*  --   --   HCT 37.7*  --   --   PLT 253  --   --   LABPROT 25.2* 27.3* 26.3*  INR 2.32* 2.57* 2.45*  CREATININE 1.45* 1.38* 1.64*    Estimated Creatinine Clearance: 55 mL/min (by C-G formula based on Cr of 1.64).  Assessment: 68yom continues on coumadin for afib. INR is therapeutic today at 2.45. CBC stable. No bleeding reported.  PTA dose: 5mg  daily  Goal of Therapy:  INR 2-3 Monitor platelets by anticoagulation protocol: Yes   Plan:  1) Coumadin 5mg  x 1 2) Daily INR  Deboraha Sprang 10/12/2015,12:56 PM

## 2015-10-12 NOTE — Progress Notes (Signed)
Site area: right IJ Site Prior to Removal:  Level 0 Pressure Applied For: 30 minutes total see note Manual: yes   Patient Status During Pull:  C/o of pain near site and voice change but is stable with vitals and site is level 0 Post Pull Site:  Level 0 Post Pull Instructions Given:  yes Post Pull Pulses Present:  Dressing Applied: occlusive   Bedrest begins @ n/a Comments: see additional note

## 2015-10-12 NOTE — Progress Notes (Signed)
Pt c/o of right neck pain, change in voice post RHC. He can not give me a number for the pain scale of 0/10. Pt grimaces when i palpate near the area of access, no hematoma noted but does have a gravely sounding voice. Dr Jeffie Pollock was paged and orders followed. And additional 15 minutes manual pressure was held level 0 pre and post . Pain med ordered and given

## 2015-10-12 NOTE — Progress Notes (Signed)
Advanced Heart Failure Rounding Note  PCP: Dr Elizabeth Palau Primary Cardiologist: Dr Lovena Le  Subjective:    [place] this am with concerns for low output. Breathing improved from admit.  Still having some DOE with exertion.   Out 950 cc, weight stable on po lasix.  Objective:   Weight Range: 235 lb (106.595 kg) Body mass index is 32.79 kg/(m^2).   Vital Signs:   Temp:  [98 F (36.7 C)-98.4 F (36.9 C)] 98 F (36.7 C) (10/19 0519) Pulse Rate:  [69-103] 69 (10/19 0519) Resp:  [18-21] 18 (10/19 0519) BP: (94-107)/(62-75) 107/71 mmHg (10/19 0519) SpO2:  [96 %-100 %] 100 % (10/19 0921) Weight:  [235 lb (106.595 kg)] 235 lb (106.595 kg) (10/19 0512) Last BM Date: 10/08/15  Weight change: Filed Weights   10/10/15 0420 10/11/15 0554 10/12/15 0512  Weight: 234 lb 1.6 oz (106.187 kg) 234 lb 11.2 oz (106.459 kg) 235 lb (106.595 kg)    Intake/Output:   Intake/Output Summary (Last 24 hours) at 10/12/15 1001 Last data filed at 10/12/15 0952  Gross per 24 hour  Intake 775.67 ml  Output   1650 ml  Net -874.33 ml     Physical Exam: General: SOB with long sentences. HEENT: normal Neck: supple. JVP 6-7 cm. Carotids 2+ bilat; no bruits. No lymphadenopathy or thyromegaly appreciated. Cor: PMI nondisplaced. Irregularly irregular.. No rubs, gallops or murmurs. Ecchymosis around L chest will small nodule. ICD R chest. Lungs: Diminished at bases Abdomen: soft, obese, nontender, nondistended. No hepatosplenomegaly. No bruits or masses. +BS Extremities: no cyanosis, clubbing, rash. No edema. <1cm nodule LLE, Non erythematous non edematous.  Neuro: alert & orientedx3, cranial nerves grossly intact. moves all 4 extremities w/o difficulty. Affect pleasant  Telemetry: Afib 70-80s  Labs: CBC  Recent Labs  10/10/15 0315  WBC 5.4  HGB 12.0*  HCT 37.7*  MCV 89.1  PLT 161   Basic Metabolic Panel  Recent Labs  10/11/15 0333 10/12/15 0306  NA 133* 135  K 4.3 5.6*  CL 98* 98*   CO2 26 29  GLUCOSE 87 89  BUN 31* 36*  CREATININE 1.38* 1.64*  CALCIUM 8.4* 8.9   Liver Function Tests No results for input(s): AST, ALT, ALKPHOS, BILITOT, PROT, ALBUMIN in the last 72 hours. No results for input(s): LIPASE, AMYLASE in the last 72 hours. Cardiac Enzymes No results for input(s): CKTOTAL, CKMB, CKMBINDEX, TROPONINI in the last 72 hours.  BNP: BNP (last 3 results)  Recent Labs  10/04/15 1604  BNP 796.9*    ProBNP (last 3 results) No results for input(s): PROBNP in the last 8760 hours.   D-Dimer No results for input(s): DDIMER in the last 72 hours. Hemoglobin A1C No results for input(s): HGBA1C in the last 72 hours. Fasting Lipid Panel No results for input(s): CHOL, HDL, LDLCALC, TRIG, CHOLHDL, LDLDIRECT in the last 72 hours. Thyroid Function Tests No results for input(s): TSH, T4TOTAL, T3FREE, THYROIDAB in the last 72 hours.  Invalid input(s): FREET3  Imaging/Studies:  No results found.   Medications:     Scheduled Medications: . furosemide  40 mg Oral BID  . phenytoin  100 mg Oral TID  . sodium chloride  3 mL Intravenous Q12H  . sodium chloride  3 mL Intravenous Q12H  . Warfarin - Pharmacist Dosing Inpatient   Does not apply q1800    Infusions: . sodium chloride 10 mL/hr at 10/12/15 0410    PRN Medications: sodium chloride, sodium chloride, acetaminophen, fentaNYL, magnesium hydroxide, midazolam, ondansetron (ZOFRAN) IV,  sodium chloride, sodium chloride   Assessment   1. Acute on Chronic Systolic CHF 2. Atrial Fibrillation 3. CAD 4. HTN 5. AKI on CKD stage III - Baseline 1.1 to 1.2 6. Seizures - on phenytoin  Plan  69 yo male with history of CAD s/p CABG, ischemic cardiomyopathy s/p ICD, chronic systolic CHF with EF of 11-55%, HTN, HLD, atrial fibrillation, previous stroke, Factor 7 deficiency and seizure disorder who was admitted for acute on chronic systolic CHF and atrial fibrillation w/ RVR. HF consulted for med management  with hypotension and continued symptoms on Milrinone.   RHC this am with low cardiac index. Volume status relatively stable. Start back on milrinone and will need PICC placed at least temporarily for coox and cvp monitoring.   Will get Myoview tomorrow.   Length of Stay: 8  Shirley Friar PA-C 10/12/2015, 10:01 AM  Advanced Heart Failure Team Pager [phone] (M-F; 7a - 4p)  Please contact [facility] for night-coverage after hours (4p -7a ) and weekends on [email or web address]  Patient seen and examined with Oda Kilts, PA-C. We discussed all aspects of the encounter. I agree with the assessment and plan as stated above.   Continues with severe dyspnea and fatigue. Renal function worse. RHC today showed low volume status but low output. Will hold diuretics. Start milrinone. Not ideal candidate for home inotropes so will try to transition to Bidil  [name], Daniel,MD 11:01 AM

## 2015-10-12 NOTE — Interval H&P Note (Signed)
History and Physical Interval Note:  10/12/2015 10:02 AM  [facility].  has presented today for surgery, with the diagnosis of hf  The various methods of treatment have been discussed with the patient and family. After consideration of risks, benefits and other options for treatment, the patient has consented to  Procedure(s): Right Heart Cath (N/A) as a surgical intervention .  The patient's history has been reviewed, patient examined, no change in status, stable for surgery.  I have reviewed the patient's chart and labs.  Questions were answered to the patient's satisfaction.     Louis Ford, Quillian Quince

## 2015-10-13 ENCOUNTER — Inpatient Hospital Stay (HOSPITAL_COMMUNITY): Payer: Medicare Other

## 2015-10-13 DIAGNOSIS — I482 Chronic atrial fibrillation: Secondary | ICD-10-CM

## 2015-10-13 LAB — POCT I-STAT 3, VENOUS BLOOD GAS (G3P V)
ACID-BASE EXCESS: 2 mmol/L (ref 0.0–2.0)
ACID-BASE EXCESS: 2 mmol/L (ref 0.0–2.0)
BICARBONATE: 26.6 meq/L — AB (ref 20.0–24.0)
BICARBONATE: 26.6 meq/L — AB (ref 20.0–24.0)
O2 SAT: 44 %
O2 SAT: 47 %
PO2 VEN: 25 mmHg — AB (ref 30.0–45.0)
TCO2: 28 mmol/L (ref 0–100)
TCO2: 28 mmol/L (ref 0–100)
pCO2, Ven: 41.9 mmHg — ABNORMAL LOW (ref 45.0–50.0)
pCO2, Ven: 41.9 mmHg — ABNORMAL LOW (ref 45.0–50.0)
pH, Ven: 7.411 — ABNORMAL HIGH (ref 7.250–7.300)
pH, Ven: 7.412 — ABNORMAL HIGH (ref 7.250–7.300)
pO2, Ven: 24 mmHg — CL (ref 30.0–45.0)

## 2015-10-13 LAB — COMPREHENSIVE METABOLIC PANEL
ALK PHOS: 82 U/L (ref 38–126)
ALT: 17 U/L (ref 17–63)
AST: 20 U/L (ref 15–41)
Albumin: 3.1 g/dL — ABNORMAL LOW (ref 3.5–5.0)
Anion gap: 7 (ref 5–15)
BUN: 32 mg/dL — AB (ref 6–20)
CALCIUM: 8.8 mg/dL — AB (ref 8.9–10.3)
CHLORIDE: 103 mmol/L (ref 101–111)
CO2: 24 mmol/L (ref 22–32)
CREATININE: 1.25 mg/dL — AB (ref 0.61–1.24)
GFR, EST NON AFRICAN AMERICAN: 58 mL/min — AB (ref >60)
Glucose, Bld: 92 mg/dL (ref 65–99)
Potassium: 4.7 mmol/L (ref 3.5–5.1)
SODIUM: 134 mmol/L — AB (ref 135–145)
Total Bilirubin: 0.8 mg/dL (ref 0.3–1.2)
Total Protein: 6.7 g/dL (ref 6.5–8.1)

## 2015-10-13 LAB — PROTIME-INR
INR: 2.57 — AB (ref 0.00–1.49)
Prothrombin Time: 27.2 seconds — ABNORMAL HIGH (ref 11.6–15.2)

## 2015-10-13 LAB — PHENYTOIN LEVEL, TOTAL: Phenytoin Lvl: 11.4 ug/mL (ref 10.0–20.0)

## 2015-10-13 MED ORDER — ISOSORB DINITRATE-HYDRALAZINE 20-37.5 MG PO TABS
0.5000 | ORAL_TABLET | Freq: Three times a day (TID) | ORAL | Status: DC
  Administered 2015-10-13 – 2015-10-15 (×5): 0.5 via ORAL
  Filled 2015-10-13 (×5): qty 1

## 2015-10-13 MED ORDER — SODIUM CHLORIDE 0.9 % IJ SOLN
10.0000 mL | INTRAMUSCULAR | Status: DC | PRN
  Administered 2015-10-14 – 2015-10-19 (×6): 10 mL
  Filled 2015-10-13 (×6): qty 40

## 2015-10-13 MED FILL — Lidocaine HCl Local Preservative Free (PF) Inj 1%: INTRAMUSCULAR | Qty: 30 | Status: AC

## 2015-10-13 NOTE — Progress Notes (Signed)
Spoke with primary RN who informed me that patient is still refusing PICC placement.

## 2015-10-13 NOTE — Care Management Important Message (Signed)
Important Message  Patient Details  Name: [facility]. MRN: [id number] Date of Birth: 10/05/1946   Medicare Important Message Given:  Yes-fourth notification given    [name] 10/13/2015, 12:14 PM

## 2015-10-13 NOTE — Progress Notes (Signed)
UR completed 

## 2015-10-13 NOTE — Progress Notes (Signed)
ANTICOAGULATION CONSULT NOTE - Follow Up Consult  Pharmacy Consult for Coumadin Indication: atrial fibrillation  No Known Allergies  Patient Measurements: Height: 5\' 11"  (180.3 cm) Weight: 232 lb 3.2 oz (105.325 kg) IBW/kg (Calculated) : 75.3  Vital Signs: Temp: 98.5 F (36.9 C) (10/20 0553) Temp Source: Oral (10/20 0553) BP: 107/66 mmHg (10/20 0839) Pulse Rate: 87 (10/20 0839)  Labs:  Recent Labs  10/11/15 0333 10/12/15 0306 10/13/15 0715  LABPROT 27.3* 26.3* 27.2*  INR 2.57* 2.45* 2.57*  CREATININE 1.38* 1.64* 1.25*    Estimated Creatinine Clearance: 71.8 mL/min (by C-G formula based on Cr of 1.25).  Assessment: 69 yo man admitted 10/04/2015 with SOB on warfarin 5mg  daily PTA for afib.Warfarin held on admit d/t supratherapeutic INR 4.47. Resumed on 10/13 with INR 2.28.   INR 2.57 therapeutic today. Last CBC stable.   Goal of Therapy:  INR 2-3 Monitor platelets by anticoagulation protocol: Yes   Plan:  Warfarin 5 mg PO x 1 today Daily INR Monitor s/sx bleeding     Heloise Ochoa, Pharm.D. PGY2 Cardiology Pharmacy Resident Pager: [phone] 10/13/2015 10:20 AM

## 2015-10-13 NOTE — Progress Notes (Signed)
Advanced Heart Failure Rounding Note  PCP: Dr Elizabeth Palau Primary Cardiologist: Dr Lovena Le  Subjective:    Feeling better on milrinone. Neck still sore. Dyspnea improved. Very anxious about what next step is. Wants [place] to call his pastor.  Objective:   Weight Range: 105.325 kg (232 lb 3.2 oz) Body mass index is 32.4 kg/(m^2).   Vital Signs:   Temp:  [98.2 F (36.8 C)-98.9 F (37.2 C)] 98.2 F (36.8 C) (10/20 2100) Pulse Rate:  [37-107] 73 (10/20 2100) Resp:  [18] 18 (10/20 2100) BP: (95-115)/(66-86) 95/75 mmHg (10/20 2100) SpO2:  [97 %-100 %] 100 % (10/20 2100) Weight:  [105.325 kg (232 lb 3.2 oz)] 105.325 kg (232 lb 3.2 oz) (10/20 0553) Last BM Date: 10/08/15  Weight change: Filed Weights   10/11/15 0554 10/12/15 0512 10/13/15 0553  Weight: 106.459 kg (234 lb 11.2 oz) 106.595 kg (235 lb) 105.325 kg (232 lb 3.2 oz)    Intake/Output:   Intake/Output Summary (Last 24 hours) at 10/13/15 2251 Last data filed at 10/13/15 1800  Gross per 24 hour  Intake  741.2 ml  Output    950 ml  Net -208.8 ml     Physical Exam: General: Sitting in bed NAD HEENT: normal Neck: supple. JVP 6-7 cm. R neck hematoma Carotids 2+ bilat; no bruits. No lymphadenopathy or thyromegaly appreciated. Cor: PMI nondisplaced. Irregularly irregular.. No rubs, gallops or murmurs. Ecchymosis around L chest will small nodule. ICD R chest. Lungs: Diminished at bases Abdomen: soft, obese, nontender, nondistended. No hepatosplenomegaly. No bruits or masses. +BS Extremities: no cyanosis, clubbing, rash. No edema. <1cm nodule LLE, Non erythematous non edematous.  Neuro: alert & orientedx3, cranial nerves grossly intact. moves all 4 extremities w/o difficulty. Affect pleasant  Telemetry: Afib 70-80s  Labs: CBC No results for input(s): WBC, NEUTROABS, HGB, HCT, MCV, PLT in the last 72 hours. Basic Metabolic Panel  Recent Labs  10/12/15 0306 10/13/15 0715  NA 135 134*  K 5.6* 4.7  CL 98* 103   CO2 29 24  GLUCOSE 89 92  BUN 36* 32*  CREATININE 1.64* 1.25*  CALCIUM 8.9 8.8*   Liver Function Tests  Recent Labs  10/13/15 0715  AST 20  ALT 17  ALKPHOS 82  BILITOT 0.8  PROT 6.7  ALBUMIN 3.1*   No results for input(s): LIPASE, AMYLASE in the last 72 hours. Cardiac Enzymes No results for input(s): CKTOTAL, CKMB, CKMBINDEX, TROPONINI in the last 72 hours.  BNP: BNP (last 3 results)  Recent Labs  10/04/15 1604  BNP 796.9*    ProBNP (last 3 results) No results for input(s): PROBNP in the last 8760 hours.   D-Dimer No results for input(s): DDIMER in the last 72 hours. Hemoglobin A1C No results for input(s): HGBA1C in the last 72 hours. Fasting Lipid Panel No results for input(s): CHOL, HDL, LDLCALC, TRIG, CHOLHDL, LDLDIRECT in the last 72 hours. Thyroid Function Tests No results for input(s): TSH, T4TOTAL, T3FREE, THYROIDAB in the last 72 hours.  Invalid input(s): FREET3  Imaging/Studies:  Dg Chest Port 1 View  10/13/2015  CLINICAL DATA:  Status post PICC line placement EXAM: PORTABLE CHEST - 1 VIEW COMPARISON:  10/04/2015 FINDINGS: A new left-sided PICC line is identified with catheter tip at the cavoatrial junction in satisfactory position. A defibrillator is again seen as are diffuse postsurgical changes. Cardiomegaly is again noted. The lungs are clear. Diffuse pericardial calcification is again noted and stable. IMPRESSION: PICC line in satisfactory position. The remainder of the exam  is stable. Electronically Signed   By: Inez Catalina M.D.   On: 10/13/2015 19:10     Medications:     Scheduled Medications: . isosorbide-hydrALAZINE  0.5 tablet Oral TID  . phenytoin  100 mg Oral TID  . sodium chloride  3 mL Intravenous Q12H  . sodium chloride  3 mL Intravenous Q12H  . sodium chloride  3 mL Intravenous Q12H  . Warfarin - Pharmacist Dosing Inpatient   Does not apply q1800    Infusions: . milrinone 0.25 mcg/kg/min (10/13/15 2141)    PRN  Medications: sodium chloride, sodium chloride, sodium chloride, acetaminophen, acetaminophen, magnesium hydroxide, ondansetron (ZOFRAN) IV, ondansetron (ZOFRAN) IV, sodium chloride, sodium chloride, sodium chloride, sodium chloride, traMADol   Assessment   1. Acute on Chronic Systolic CHF 2. Atrial Fibrillation 3. CAD 4. HTN 5. AKI on CKD stage III - Baseline 1.1 to 1.2 6. Seizures - on phenytoin  Plan   Feeling better on milrinone. Has been vascillating on PICC but now agrees. Will follow co-ox and try to wean milrinone by adding Bidil. Not ideal candidate for home inotropes. Volume status ok. Will discuss with his pastor at his request. Continue coumadin for AF.    Length of Stay: 9  [name], Daniel MD  10/13/2015, 10:51 PM  Advanced Heart Failure Team Pager [phone] (M-F; [place])  Please contact [facility] for night-coverage after hours (4p -7a ) and weekends on [email or web address]

## 2015-10-13 NOTE — Care Management Note (Signed)
Case Management Note  Patient Details  Name: Louis Ford, Louis Ford. MRN: [id number] Date of Birth: Jan 26, 1946  Subjective/Objective:   Admitted with CHF                Action/Plan: Patient independent prior to admission, support person - Louis Ford ([phone]); CM talked to patient with Enid Derry on speaker phone to discuss his health concerns. Patient is afraid financially about the cost of medication/ hospitalization. Pt has private insurance with [facility] with prescription drug coverage- Enid Derry informed the patient that financially he is fine and that this is not a problem. Enid Derry manages all of his finances). Lots of emotional support given also. Patient wants to change his PCP from Dr Kennon Holter to the [facility]- Enid Derry stated that she is working on this. CM stressed the importance of compliance with his medical care and treatment plan. He stated that he will try to do the things that are necessary for him to get better. Butch Penny with [facility] called and she will discuss their role and the service that they will be providing at discharge. Expected Discharge Date:  Possibly 10/15/2015               Expected Discharge Plan:  [place]  Discharge planning Services  CM Consult   Choice offered to:  Patient   HH Arranged:  RN [facility] Agency:  [facility]  Status of Service:  In process, will continue to follow  Medicare Important Message Given:  Yes-third notification given  Sherrilyn Rist [phone] 10/13/2015, 10:53 AM

## 2015-10-13 NOTE — Progress Notes (Signed)
Peripherally Inserted Central Catheter/Midline Placement  The IV Nurse has discussed with the patient and/or persons authorized to consent for the patient, the purpose of this procedure and the potential benefits and risks involved with this procedure.  The benefits include less needle sticks, lab draws from the catheter and patient may be discharged home with the catheter.  Risks include, but not limited to, infection, bleeding, blood clot (thrombus formation), and puncture of an artery; nerve damage and irregular heat beat.  Alternatives to this procedure were also discussed.  PICC/Midline Placement Documentation  PICC / Midline Double Lumen 24/46/95 PICC Left Basilic 46 cm 0 cm (Active)  Indication for Insertion or Continuance of Line Prolonged intravenous therapies;Chronic illness with exacerbations (CF, Sickle Cell, etc.) 10/13/2015  7:04 PM  Exposed Catheter (cm) 0 cm 10/13/2015  7:04 PM  Site Assessment Clean;Dry;Intact 10/13/2015  7:04 PM  Lumen #1 Status Flushed;Saline locked;Blood return noted 10/13/2015  7:04 PM  Lumen #2 Status Flushed;Saline locked;Blood return noted 10/13/2015  7:04 PM  Dressing Type Transparent 10/13/2015  7:04 PM  Dressing Status Clean;Dry;Intact;Antimicrobial disc in place 10/13/2015  7:04 PM  Line Care Connections checked and tightened 10/13/2015  7:04 PM  Line Adjustment (NICU/IV Team Only) No 10/13/2015  7:04 PM  Dressing Intervention New dressing 10/13/2015  7:04 PM  Dressing Change Due 10/20/15 10/13/2015  7:04 PM       Rolena Infante 10/13/2015, 7:05 PM

## 2015-10-13 NOTE — Progress Notes (Signed)
Spoke with RN , stated that the pt refused PICC placement yesterday.  RN to update PICC consult if pt becomes agreeable to PICC placement.

## 2015-10-14 LAB — BASIC METABOLIC PANEL
ANION GAP: 7 (ref 5–15)
BUN: 30 mg/dL — AB (ref 6–20)
CALCIUM: 8.7 mg/dL — AB (ref 8.9–10.3)
CO2: 26 mmol/L (ref 22–32)
Chloride: 100 mmol/L — ABNORMAL LOW (ref 101–111)
Creatinine, Ser: 1.24 mg/dL (ref 0.61–1.24)
GFR calc Af Amer: >60 mL/min (ref >60)
GFR, EST NON AFRICAN AMERICAN: 59 mL/min — AB (ref >60)
GLUCOSE: 96 mg/dL (ref 65–99)
Potassium: 4.7 mmol/L (ref 3.5–5.1)
SODIUM: 133 mmol/L — AB (ref 135–145)

## 2015-10-14 LAB — PROTIME-INR
INR: 2.35 — AB (ref 0.00–1.49)
Prothrombin Time: 25.5 seconds — ABNORMAL HIGH (ref 11.6–15.2)

## 2015-10-14 LAB — CARBOXYHEMOGLOBIN
CARBOXYHEMOGLOBIN: 1.6 % — AB (ref 0.5–1.5)
METHEMOGLOBIN: 0.8 % (ref 0.0–1.5)
O2 Saturation: 70.8 %
Total hemoglobin: 12.8 g/dL — ABNORMAL LOW (ref 13.5–18.0)

## 2015-10-14 MED ORDER — WARFARIN SODIUM 5 MG PO TABS: 5.0000 mg | ORAL_TABLET | Freq: Once | ORAL | Status: DC

## 2015-10-14 MED ORDER — WARFARIN SODIUM 7.5 MG PO TABS
7.5000 mg | ORAL_TABLET | Freq: Once | ORAL | Status: AC
  Administered 2015-10-14: 7.5 mg via ORAL
  Filled 2015-10-14: qty 1

## 2015-10-14 MED ORDER — FUROSEMIDE 80 MG PO TABS
80.0000 mg | ORAL_TABLET | Freq: Every day | ORAL | Status: DC
  Administered 2015-10-15: 80 mg via ORAL
  Filled 2015-10-14: qty 1

## 2015-10-14 NOTE — Progress Notes (Signed)
Pt a/o, c/o neck pain PRN Tramadol given as ordered, pt still on Milrinone gtt @ 9ml/hr, VSS, pt stable

## 2015-10-14 NOTE — Care Management Note (Signed)
Case Management Note  Patient Details  Name: [facility]. MRN: [id number] Date of Birth: [birth year]   Action/Plan: Coupon for Bidil given to the patient with explanation of usage for a free 30 day supply of medication. Patient requesting to change [place] agency to [facility] for CHF. Mary with [facility] made aware of referral.   Expected Discharge Date:  Possibly 10/15/2015              Expected Discharge Plan:  [place]  Discharge planning Services  CM Consult  Choice offered to:  Patient    HH Arranged:  RN [facility] Agency:  [facility]  Status of Service:  In process, will continue to follow  Medicare Important Message Given:  Yes-fourth notification given  Sherrilyn Rist [phone] 10/14/2015, 3:18 PM

## 2015-10-14 NOTE — Progress Notes (Signed)
Advanced Heart Failure Rounding Note  PCP: Dr Elizabeth Palau Primary Cardiologist: Dr Lovena Le  Subjective:    Feels good today. Neck and chest still sore. No SOB or lightheadedness.  Willing to stay through weekend.  Out 895 cc on milrinone 0.25. Coox 70.8. Creatinine 1.24. No lasix currently.  Objective:   Weight Range: 233 lb 14.4 oz (106.096 kg) Body mass index is 32.64 kg/(m^2).   Vital Signs:   Temp:  [97.8 F (36.6 C)-98.5 F (36.9 C)] 98.3 F (36.8 C) (10/21 0813) Pulse Rate:  [61-107] 61 (10/21 0813) Resp:  [18-20] 20 (10/21 0600) BP: (95-133)/(73-85) 133/85 mmHg (10/21 0813) SpO2:  [98 %-100 %] 100 % (10/21 0813) Weight:  [233 lb 14.4 oz (106.096 kg)] 233 lb 14.4 oz (106.096 kg) (10/21 0600) Last BM Date: 10/11/15  Weight change: Filed Weights   10/12/15 0512 10/13/15 0553 10/14/15 0600  Weight: 235 lb (106.595 kg) 232 lb 3.2 oz (105.325 kg) 233 lb 14.4 oz (106.096 kg)    Intake/Output:   Intake/Output Summary (Last 24 hours) at 10/14/15 0858 Last data filed at 10/14/15 0813  Gross per 24 hour  Intake    950 ml  Output   1595 ml  Net   -645 ml     Physical Exam: General: Sitting in bed NAD HEENT: normal Neck: supple. JVP 6 cm. R neck hematoma, improved. Carotids 2+ bilat; no bruits. No thyromegaly or nodule. Cor: PMI nondisplaced. Irregularly irregular.. No rubs, gallops or murmurs. Ecchymosis around L chest will small nodule. ICD R chest. Lungs: Clear Abdomen: soft, obese, NT, ND, No HSM. No bruits or masses. Good bowel sounds. Extremities: no cyanosis, clubbing, rash, or edema. <1cm nodule LLE, Non erythematous non edematous.  Neuro: alert & orientedx3, cranial nerves grossly intact. moves all 4 extremities w/o difficulty. Affect pleasant  Telemetry: Afib 70s  Labs: CBC No results for input(s): WBC, NEUTROABS, HGB, HCT, MCV, PLT in the last 72 hours. Basic Metabolic Panel  Recent Labs  10/13/15 0715 10/14/15 0435  NA 134* 133*  K 4.7  4.7  CL 103 100*  CO2 24 26  GLUCOSE 92 96  BUN 32* 30*  CREATININE 1.25* 1.24  CALCIUM 8.8* 8.7*   Liver Function Tests  Recent Labs  10/13/15 0715  AST 20  ALT 17  ALKPHOS 82  BILITOT 0.8  PROT 6.7  ALBUMIN 3.1*   No results for input(s): LIPASE, AMYLASE in the last 72 hours. Cardiac Enzymes No results for input(s): CKTOTAL, CKMB, CKMBINDEX, TROPONINI in the last 72 hours.  BNP: BNP (last 3 results)  Recent Labs  10/04/15 1604  BNP 796.9*    ProBNP (last 3 results) No results for input(s): PROBNP in the last 8760 hours.   D-Dimer No results for input(s): DDIMER in the last 72 hours. Hemoglobin A1C No results for input(s): HGBA1C in the last 72 hours. Fasting Lipid Panel No results for input(s): CHOL, HDL, LDLCALC, TRIG, CHOLHDL, LDLDIRECT in the last 72 hours. Thyroid Function Tests No results for input(s): TSH, T4TOTAL, T3FREE, THYROIDAB in the last 72 hours.  Invalid input(s): FREET3  Imaging/Studies:  Dg Chest Port 1 View  10/13/2015  CLINICAL DATA:  Status post PICC line placement EXAM: PORTABLE CHEST - 1 VIEW COMPARISON:  10/04/2015 FINDINGS: A new left-sided PICC line is identified with catheter tip at the cavoatrial junction in satisfactory position. A defibrillator is again seen as are diffuse postsurgical changes. Cardiomegaly is again noted. The lungs are clear. Diffuse pericardial calcification is again noted  and stable. IMPRESSION: PICC line in satisfactory position. The remainder of the exam is stable. Electronically Signed   By: Inez Catalina M.D.   On: 10/13/2015 19:10     Medications:     Scheduled Medications: . isosorbide-hydrALAZINE  0.5 tablet Oral TID  . phenytoin  100 mg Oral TID  . sodium chloride  3 mL Intravenous Q12H  . sodium chloride  3 mL Intravenous Q12H  . sodium chloride  3 mL Intravenous Q12H  . Warfarin - Pharmacist Dosing Inpatient   Does not apply q1800    Infusions: . milrinone 0.25 mcg/kg/min (10/13/15 2141)     PRN Medications: sodium chloride, sodium chloride, sodium chloride, acetaminophen, acetaminophen, magnesium hydroxide, ondansetron (ZOFRAN) IV, ondansetron (ZOFRAN) IV, sodium chloride, sodium chloride, sodium chloride, sodium chloride, traMADol   Assessment   1. Acute on Chronic Systolic CHF, echo 95/63/87 LVEF 30-35%, PA peak pressure 34 mmHg 2. Atrial Fibrillation 3. CAD 4. HTN 5. AKI on CKD stage III - Baseline 1.1 to 1.2 6. Seizures - on phenytoin  Plan   Continue current milrinone for now. Feeling better.    Contine bidil 0.5 tab TID and up titrate as able. Will work on weaning milrinone over weekend.   Likely home early next week.  Length of Stay: 10  Shirley Friar PA-C 10/14/2015, 8:58 AM  Advanced Heart Failure Team Pager [phone] (M-F; 7a - 4p)  Please contact [facility] for night-coverage after hours (4p -7a ) and weekends on [email or web address]  Patient seen and examined with Oda Kilts, PA-C. We discussed all aspects of the encounter. I agree with the assessment and plan as stated above.   Much improved with milrinone. With start Bidil to hopefully facilitate weaning of inotropes. Restart po lasix. Continue to follow co-ox. Not ideal candidate for home inotropes.   Depaul Arizpe,MD 5:37 PM

## 2015-10-14 NOTE — Progress Notes (Signed)
ANTICOAGULATION CONSULT NOTE - Follow Up Consult  Pharmacy Consult for Coumadin Indication: atrial fibrillation  No Known Allergies  Patient Measurements: Height: 5\' 11"  (180.3 cm) Weight: 233 lb 14.4 oz (106.096 kg) IBW/kg (Calculated) : 75.3  Vital Signs: Temp: 98.3 F (36.8 C) (10/21 0813) Temp Source: Oral (10/21 0813) BP: 133/85 mmHg (10/21 0813) Pulse Rate: 61 (10/21 0813)  Labs:  Recent Labs  10/12/15 0306 10/13/15 0715 10/14/15 0435  LABPROT 26.3* 27.2* 25.5*  INR 2.45* 2.57* 2.35*  CREATININE 1.64* 1.25* 1.24    Estimated Creatinine Clearance: 72.6 mL/min (by C-G formula based on Cr of 1.24).  Assessment: 69 yo man admitted 10/04/2015 with SOB on warfarin 5mg  daily PTA for afib.Warfarin held on admit d/t supratherapeutic INR 4.47. Resumed on 10/13 with INR 2.28.   INR 2.35 therapeutic today. Last CBC stable. Missed warfarin dose last night, will give boosted dose tonight and resume regimen tomorrow.  Goal of Therapy:  INR 2-3 Monitor platelets by anticoagulation protocol: Yes   Plan:  Warfarin 7.5 mg PO x 1 today Daily INR Monitor s/sx bleeding     Heloise Ochoa, Pharm.D. PGY2 Cardiology Pharmacy Resident Pager: [phone] 10/14/2015 9:58 AM

## 2015-10-15 LAB — BASIC METABOLIC PANEL
ANION GAP: 5 (ref 5–15)
BUN: 29 mg/dL — ABNORMAL HIGH (ref 6–20)
CALCIUM: 8.4 mg/dL — AB (ref 8.9–10.3)
CO2: 25 mmol/L (ref 22–32)
Chloride: 100 mmol/L — ABNORMAL LOW (ref 101–111)
Creatinine, Ser: 1.23 mg/dL (ref 0.61–1.24)
GFR calc Af Amer: >60 mL/min (ref >60)
GFR, EST NON AFRICAN AMERICAN: 59 mL/min — AB (ref >60)
GLUCOSE: 86 mg/dL (ref 65–99)
POTASSIUM: 4.8 mmol/L (ref 3.5–5.1)
SODIUM: 130 mmol/L — AB (ref 135–145)

## 2015-10-15 LAB — CARBOXYHEMOGLOBIN
CARBOXYHEMOGLOBIN: 1.8 % — AB (ref 0.5–1.5)
METHEMOGLOBIN: 0.8 % (ref 0.0–1.5)
O2 Saturation: 54.6 %
Total hemoglobin: 12.4 g/dL — ABNORMAL LOW (ref 13.5–18.0)

## 2015-10-15 LAB — PROTIME-INR
INR: 2.03 — ABNORMAL HIGH (ref 0.00–1.49)
Prothrombin Time: 22.8 seconds — ABNORMAL HIGH (ref 11.6–15.2)

## 2015-10-15 MED ORDER — WARFARIN SODIUM 5 MG PO TABS
5.0000 mg | ORAL_TABLET | Freq: Once | ORAL | Status: AC
  Administered 2015-10-15: 5 mg via ORAL
  Filled 2015-10-15: qty 1

## 2015-10-15 MED ORDER — FUROSEMIDE 10 MG/ML IJ SOLN
80.0000 mg | Freq: Two times a day (BID) | INTRAMUSCULAR | Status: DC
  Administered 2015-10-15: 80 mg via INTRAVENOUS
  Filled 2015-10-15: qty 8

## 2015-10-15 MED ORDER — ISOSORB DINITRATE-HYDRALAZINE 20-37.5 MG PO TABS
1.0000 | ORAL_TABLET | Freq: Three times a day (TID) | ORAL | Status: DC
  Administered 2015-10-15 (×2): 1 via ORAL
  Filled 2015-10-15 (×2): qty 1

## 2015-10-15 NOTE — Progress Notes (Signed)
Advanced Heart Failure Rounding Note  PCP: Dr Elizabeth Palau Primary Cardiologist: Dr Lovena Le  Subjective:    Remains on milrinone. Titrating bidil. Co-ox 71% -> 55%  Po lasix restarted yesterday. Weight up a pound. Still with pleuritic CP. SOB with walking to bathroom. Neck a little sore.   Objective:   Weight Range: 106.232 kg (234 lb 3.2 oz) Body mass index is 32.68 kg/(m^2).   Vital Signs:   Temp:  [97.7 F (36.5 C)-98.2 F (36.8 C)] 98.2 F (36.8 C) (10/22 0837) Pulse Rate:  [91-110] 96 (10/22 0837) Resp:  [18] 18 (10/22 0500) BP: (104-129)/(71-94) 120/76 mmHg (10/22 0837) SpO2:  [98 %-100 %] 100 % (10/22 0837) Weight:  [106.232 kg (234 lb 3.2 oz)] 106.232 kg (234 lb 3.2 oz) (10/22 0500) Last BM Date: 10/11/15  Weight change: Filed Weights   10/13/15 0553 10/14/15 0600 10/15/15 0500  Weight: 105.325 kg (232 lb 3.2 oz) 106.096 kg (233 lb 14.4 oz) 106.232 kg (234 lb 3.2 oz)    Intake/Output:   Intake/Output Summary (Last 24 hours) at 10/15/15 0941 Last data filed at 10/15/15 0900  Gross per 24 hour  Intake    960 ml  Output   2225 ml  Net  -1265 ml     Physical Exam: General: Sitting in bed NAD HEENT: normal Neck: supple. JVP 8 cm. R neck hematoma, improved. Carotids 2+ bilat; no bruits. No thyromegaly or nodule. Cor: PMI nondisplaced. Irregularly irregular.. No rubs, gallops or murmurs. Ecchymosis around L chest will small nodule. ICD R chest. Lungs: Clear Abdomen: soft, obese, NT, ND, No HSM. No bruits or masses. Good bowel sounds. Extremities: no cyanosis, clubbing, rash,  trace edema.  Neuro: alert & orientedx3, cranial nerves grossly intact. moves all 4 extremities w/o difficulty. Affect pleasant  Telemetry: Afib 70s  Labs: CBC No results for input(s): WBC, NEUTROABS, HGB, HCT, MCV, PLT in the last 72 hours. Basic Metabolic Panel  Recent Labs  10/14/15 0435 10/15/15 0325  NA 133* 130*  K 4.7 4.8  CL 100* 100*  CO2 26 25  GLUCOSE 96 86   BUN 30* 29*  CREATININE 1.24 1.23  CALCIUM 8.7* 8.4*   Liver Function Tests  Recent Labs  10/13/15 0715  AST 20  ALT 17  ALKPHOS 82  BILITOT 0.8  PROT 6.7  ALBUMIN 3.1*   No results for input(s): LIPASE, AMYLASE in the last 72 hours. Cardiac Enzymes No results for input(s): CKTOTAL, CKMB, CKMBINDEX, TROPONINI in the last 72 hours.  BNP: BNP (last 3 results)  Recent Labs  10/04/15 1604  BNP 796.9*    ProBNP (last 3 results) No results for input(s): PROBNP in the last 8760 hours.   D-Dimer No results for input(s): DDIMER in the last 72 hours. Hemoglobin A1C No results for input(s): HGBA1C in the last 72 hours. Fasting Lipid Panel No results for input(s): CHOL, HDL, LDLCALC, TRIG, CHOLHDL, LDLDIRECT in the last 72 hours. Thyroid Function Tests No results for input(s): TSH, T4TOTAL, T3FREE, THYROIDAB in the last 72 hours.  Invalid input(s): FREET3  Imaging/Studies:  Dg Chest Port 1 View  10/13/2015  CLINICAL DATA:  Status post PICC line placement EXAM: PORTABLE CHEST - 1 VIEW COMPARISON:  10/04/2015 FINDINGS: A new left-sided PICC line is identified with catheter tip at the cavoatrial junction in satisfactory position. A defibrillator is again seen as are diffuse postsurgical changes. Cardiomegaly is again noted. The lungs are clear. Diffuse pericardial calcification is again noted and stable. IMPRESSION: PICC line  in satisfactory position. The remainder of the exam is stable. Electronically Signed   By: Inez Catalina M.D.   On: 10/13/2015 19:10     Medications:     Scheduled Medications: . furosemide  80 mg Oral Daily  . isosorbide-hydrALAZINE  0.5 tablet Oral TID  . phenytoin  100 mg Oral TID  . sodium chloride  3 mL Intravenous Q12H  . sodium chloride  3 mL Intravenous Q12H  . Warfarin - Pharmacist Dosing Inpatient   Does not apply q1800    Infusions: . milrinone 0.25 mcg/kg/min (10/14/15 1308)    PRN Medications: sodium chloride, sodium chloride,  acetaminophen, acetaminophen, magnesium hydroxide, ondansetron (ZOFRAN) IV, ondansetron (ZOFRAN) IV, sodium chloride, sodium chloride, sodium chloride, traMADol   Assessment   1. Acute on Chronic Systolic CHF, echo 90/24/09 LVEF 30-35%,  2. Atrial Fibrillation 3. CAD 4. HTN 5. AKI on CKD stage III - Baseline 1.1 to 1.2 6. Seizures - on phenytoin  Plan   Co-ox back down. Still with NYHA IIIb symptoms. Continue current milrinone for now. Increase bidil. Will give IV lasix again today.   Will work on weaning milrinone early next week. Needs CR.  Length of Stay: 11  Glori Bickers MD  10/15/2015, 9:41 AM  Advanced Heart Failure Team Pager [phone] (M-F; 7a - 4p)  Please contact [facility] for night-coverage after hours (4p -7a ) and weekends on [email or web address]

## 2015-10-15 NOTE — Progress Notes (Signed)
CARDIAC REHAB PHASE I   PRE:  Rate/Rhythm: 112 afib PVCs  BP:  Supine:   Sitting: 125/87  Standing:    SaO2: 99%RA  MODE:  Ambulation: 120 ft   POST:  Rate/Rhythm: 150's afib walking   118 with rest  BP:  Supine:   Sitting: 133/85  Standing:    SaO2: 99%RA 1100-1145 Assisted to bathroom and waited for pt so we could walk. Pt walked 120 ft on RA with rolling walker and asst x 1 with slow gait. Pt bent over and c/o knee pain. Stated he had fallen prior to admission when he had a seizure and he hurt his left knee. Stated this was bothering him more than the SOB. Encouraged him to purse lip breathe and rest as heart rate to 150s. Would recommend PT consult since he is having such knee pain. To recliner with call bell. Motivated to walk.   Graylon Good, RN BSN  10/15/2015 11:41 AM

## 2015-10-15 NOTE — Progress Notes (Signed)
ANTICOAGULATION CONSULT NOTE - Follow Up Consult  Pharmacy Consult for coumadin Indication: atrial fibrillation  No Known Allergies  Patient Measurements: Height: 5\' 11"  (180.3 cm) Weight: 234 lb 3.2 oz (106.232 kg) (scale c) IBW/kg (Calculated) : 75.3 Heparin Dosing Weight:   Vital Signs: Temp: 98.2 F (36.8 C) (10/22 0837) Temp Source: Oral (10/22 0837) BP: 120/76 mmHg (10/22 0837) Pulse Rate: 96 (10/22 0837)  Labs:  Recent Labs  10/13/15 0715 10/14/15 0435 10/15/15 0325  LABPROT 27.2* 25.5* 22.8*  INR 2.57* 2.35* 2.03*  CREATININE 1.25* 1.24 1.23    Estimated Creatinine Clearance: 73.3 mL/min (by C-G formula based on Cr of 1.23).   Medications:  Scheduled:  . furosemide  80 mg Intravenous BID  . isosorbide-hydrALAZINE  1 tablet Oral TID  . phenytoin  100 mg Oral TID  . sodium chloride  3 mL Intravenous Q12H  . sodium chloride  3 mL Intravenous Q12H  . Warfarin - Pharmacist Dosing Inpatient   Does not apply q1800   Infusions:  . milrinone 0.25 mcg/kg/min (10/14/15 1308)    Assessment: 68 yo male with hx of afib is currently on therapeutic coumadin but INR down to 2.03 probably due to missed dose on 10/20.  Boosted dose given on 10/21  Goal of Therapy:  INR 2-3 Monitor platelets by anticoagulation protocol: Yes   Plan:  - coumadin 5 mg po x1 - INR in am  Querida Beretta, Tsz-Yin 10/15/2015,10:38 AM

## 2015-10-16 LAB — BASIC METABOLIC PANEL
Anion gap: 9 (ref 5–15)
BUN: 35 mg/dL — AB (ref 6–20)
CALCIUM: 8.6 mg/dL — AB (ref 8.9–10.3)
CHLORIDE: 101 mmol/L (ref 101–111)
CO2: 24 mmol/L (ref 22–32)
CREATININE: 1.42 mg/dL — AB (ref 0.61–1.24)
GFR calc Af Amer: 58 mL/min — ABNORMAL LOW (ref >60)
GFR calc non Af Amer: 50 mL/min — ABNORMAL LOW (ref >60)
Glucose, Bld: 86 mg/dL (ref 65–99)
Potassium: 4.2 mmol/L (ref 3.5–5.1)
SODIUM: 134 mmol/L — AB (ref 135–145)

## 2015-10-16 LAB — CARBOXYHEMOGLOBIN
CARBOXYHEMOGLOBIN: 2 % — AB (ref 0.5–1.5)
METHEMOGLOBIN: 0.7 % (ref 0.0–1.5)
O2 SAT: 72.4 %
TOTAL HEMOGLOBIN: 12.9 g/dL — AB (ref 13.5–18.0)

## 2015-10-16 LAB — PROTIME-INR
INR: 2.24 — ABNORMAL HIGH (ref 0.00–1.49)
Prothrombin Time: 24.6 seconds — ABNORMAL HIGH (ref 11.6–15.2)

## 2015-10-16 MED ORDER — AMIODARONE HCL 200 MG PO TABS
200.0000 mg | ORAL_TABLET | Freq: Two times a day (BID) | ORAL | Status: DC
  Administered 2015-10-16 – 2015-10-20 (×9): 200 mg via ORAL
  Filled 2015-10-16 (×9): qty 1

## 2015-10-16 MED ORDER — FUROSEMIDE 80 MG PO TABS
80.0000 mg | ORAL_TABLET | Freq: Every day | ORAL | Status: DC
  Administered 2015-10-16 – 2015-10-20 (×5): 80 mg via ORAL
  Filled 2015-10-16 (×5): qty 1

## 2015-10-16 MED ORDER — ISOSORB DINITRATE-HYDRALAZINE 20-37.5 MG PO TABS
2.0000 | ORAL_TABLET | Freq: Three times a day (TID) | ORAL | Status: DC
  Administered 2015-10-16 – 2015-10-20 (×13): 2 via ORAL
  Filled 2015-10-16 (×13): qty 2

## 2015-10-16 MED ORDER — WARFARIN SODIUM 5 MG PO TABS
5.0000 mg | ORAL_TABLET | Freq: Once | ORAL | Status: AC
  Administered 2015-10-16: 5 mg via ORAL
  Filled 2015-10-16: qty 1

## 2015-10-16 NOTE — Progress Notes (Signed)
ANTICOAGULATION CONSULT NOTE - Follow Up Consult  Pharmacy Consult for coumadin Indication: atrial fibrillation  No Known Allergies  Patient Measurements: Height: 5\' 11"  (180.3 cm) Weight: 234 lb 2.1 oz (106.2 kg) IBW/kg (Calculated) : 75.3 Heparin Dosing Weight:   Vital Signs: Temp: 97.5 F (36.4 C) (10/23 0404) Temp Source: Oral (10/23 0404) BP: 134/90 mmHg (10/23 0404) Pulse Rate: 78 (10/23 0404)  Labs:  Recent Labs  10/14/15 0435 10/15/15 0325 10/16/15 0530  LABPROT 25.5* 22.8* 24.6*  INR 2.35* 2.03* 2.24*  CREATININE 1.24 1.23 1.42*    Estimated Creatinine Clearance: 63.5 mL/min (by C-G formula based on Cr of 1.42).   Medications:  Scheduled:  . amiodarone  200 mg Oral BID  . [START ON 10/17/2015] furosemide  80 mg Oral Daily  . isosorbide-hydrALAZINE  2 tablet Oral TID  . phenytoin  100 mg Oral TID  . sodium chloride  3 mL Intravenous Q12H  . sodium chloride  3 mL Intravenous Q12H  . Warfarin - Pharmacist Dosing Inpatient   Does not apply q1800   Infusions:  . milrinone 0.25 mcg/kg/min (10/15/15 1402)    Assessment: 69 yo male with afib is currently on therapeutic coumadin.  INR today is 2.24  Goal of Therapy:  INR 2-3 Monitor platelets by anticoagulation protocol: Yes   Plan:  Coumadin 5mg  x 1 PO (if cont to go up, may need 2.5 mg few days a week) Daily INR  Thai Hemrick, Tsz-Yin 10/16/2015,10:23 AM

## 2015-10-16 NOTE — Progress Notes (Signed)
Advanced Heart Failure Rounding Note  PCP: Dr Elizabeth Palau Primary Cardiologist: Dr Lovena Le  Subjective:    Remains on milrinone. Titrating bidil. Co-ox 71% -> 55% -> 72%  Says he feels ok. Walked 150 ft yesterday but AF rate got up to 150 and was a bit SOB. Weight stable.    Objective:   Weight Range: 106.2 kg (234 lb 2.1 oz) Body mass index is 32.67 kg/(m^2).   Vital Signs:   Temp:  [97.5 F (36.4 C)-98.2 F (36.8 C)] 97.5 F (36.4 C) (10/23 0404) Pulse Rate:  [77-96] 78 (10/23 0404) Resp:  [18-20] 18 (10/23 0404) BP: (108-134)/(76-90) 134/90 mmHg (10/23 0404) SpO2:  [98 %-100 %] 98 % (10/23 0404) Weight:  [106.2 kg (234 lb 2.1 oz)] 106.2 kg (234 lb 2.1 oz) (10/23 0404) Last BM Date: 10/15/15  Weight change: Filed Weights   10/14/15 0600 10/15/15 0500 10/16/15 0404  Weight: 106.096 kg (233 lb 14.4 oz) 106.232 kg (234 lb 3.2 oz) 106.2 kg (234 lb 2.1 oz)    Intake/Output:   Intake/Output Summary (Last 24 hours) at 10/16/15 0836 Last data filed at 10/15/15 2200  Gross per 24 hour  Intake   1520 ml  Output   2576 ml  Net  -1056 ml     Physical Exam: General: Sitting in bed NAD HEENT: normal Neck: supple. JVP 7-8 cm. R neck hematoma, improved. Carotids 2+ bilat; no bruits. No thyromegaly or nodule. Cor: PMI nondisplaced. Irregularly irregula.. No rubs, gallops or murmurs. Ecchymosis around L chest will small nodule. ICD R chest. Lungs: Clear Abdomen: soft, obese, NT, ND, No HSM. No bruits or masses. Good bowel sounds. Extremities: no cyanosis, clubbing, rash,  trace edema.  Neuro: alert & orientedx3, cranial nerves grossly intact. moves all 4 extremities w/o difficulty. Affect pleasant  Telemetry: Afib 70-90s at rest   Labs: CBC No results for input(s): WBC, NEUTROABS, HGB, HCT, MCV, PLT in the last 72 hours. Basic Metabolic Panel  Recent Labs  10/15/15 0325 10/16/15 0530  NA 130* 134*  K 4.8 4.2  CL 100* 101  CO2 25 24  GLUCOSE 86 86  BUN  29* 35*  CREATININE 1.23 1.42*  CALCIUM 8.4* 8.6*   Liver Function Tests No results for input(s): AST, ALT, ALKPHOS, BILITOT, PROT, ALBUMIN in the last 72 hours. No results for input(s): LIPASE, AMYLASE in the last 72 hours. Cardiac Enzymes No results for input(s): CKTOTAL, CKMB, CKMBINDEX, TROPONINI in the last 72 hours.  BNP: BNP (last 3 results)  Recent Labs  10/04/15 1604  BNP 796.9*    ProBNP (last 3 results) No results for input(s): PROBNP in the last 8760 hours.   D-Dimer No results for input(s): DDIMER in the last 72 hours. Hemoglobin A1C No results for input(s): HGBA1C in the last 72 hours. Fasting Lipid Panel No results for input(s): CHOL, HDL, LDLCALC, TRIG, CHOLHDL, LDLDIRECT in the last 72 hours. Thyroid Function Tests No results for input(s): TSH, T4TOTAL, T3FREE, THYROIDAB in the last 72 hours.  Invalid input(s): FREET3  Imaging/Studies:  No results found.   Medications:     Scheduled Medications: . furosemide  80 mg Intravenous BID  . isosorbide-hydrALAZINE  1 tablet Oral TID  . phenytoin  100 mg Oral TID  . sodium chloride  3 mL Intravenous Q12H  . sodium chloride  3 mL Intravenous Q12H  . Warfarin - Pharmacist Dosing Inpatient   Does not apply q1800    Infusions: . milrinone 0.25 mcg/kg/min (10/15/15 1402)  PRN Medications: sodium chloride, sodium chloride, acetaminophen, acetaminophen, magnesium hydroxide, ondansetron (ZOFRAN) IV, ondansetron (ZOFRAN) IV, sodium chloride, sodium chloride, sodium chloride, traMADol   Assessment   1. Acute on Chronic Systolic CHF, echo 81/38/87 LVEF 30-35%,  2. Atrial Fibrillation 3. CAD 4. HTN 5. AKI on CKD stage III - Baseline 1.1 to 1.2 6. Seizures - on phenytoin  Plan   Co-ox looks good on milrinone.  Still with NYHA IIIb symptoms. Continue current milrinone for now. Increase bidil. Switch back to po lasix.   IN looking back through all his clinic notes it seems as if he really has PAF and  not chronic AF and I wonder if AF is reason for recent decompensation in related to recurrent AF. Will start amio for rate control and consider possible DC-CV. Continue coumadin.   Will work on weaning milrinone early next week. Needs CR.  Length of Stay: 12  Glori Bickers MD  10/16/2015, 8:36 AM  Advanced Heart Failure Team Pager [phone] (M-F; [place])  Please contact [facility] for night-coverage after hours (4p -7a ) and weekends on [email or web address]

## 2015-10-16 NOTE — Evaluation (Signed)
Physical Therapy Evaluation Patient Details Name: Louis Ford. MRN: [id number] DOB: December 04, 1946 Today's Date: 10/16/2015   History of Present Illness    69 y.o. male with hx of CAD s/p CABG, ICM s/p Biotronik ICD, chronic systolic CHF Echo 53/97/67 LVEF 30-35%; normal RV; PA peak pressure 34 mm Hg, HTN, HLD, atrial fibrillation, previous stroke, Factor 7 deficiency, and seizure disorder. He presented to [facility] office on 10/04/15 with worsening SOB and Afib RVR and was admitted for HF management. Prior to this month could walk ~ 3 block round trip without difficulty. Since fall/seizure on 09/26/15 has had SOB walking to bathroom and back. No SOB at rest or PND. Chronic 3 pillow orthopnea.   Clinical Impression  Pt presents with moderate limitations to functional mobility related to residual pain at left knee and chest following recent seizure>fall and complicated by cardiopulmonary dysfunction.  PT requested due to knee pain; assessed noting mild swelling vs right knee, tenderness but full ROM and no incr pain with movement; WB slightly limited, requires device to safely mobilize (has cane, recommend RW until injury heals).  Instructed in simple ROM exercise to promote mobility/flexibility, edema and pain management, and provided ice pack to knee with instruction to remove after 15-20, reapply no sooner than 30 min, repeat as needed from pain/edema to left knee, nursing staff verbally updated as well.  Recommend HHPT for follow up at d/c, however pt very concerned about associated costs and requests please consult with caregiver (his pastor) before any additional expenses are accrued... Also recommend a RW for d/c if knee pain still limiting mobility with same caveat per patient.  Will benefit from continued cardiac rehab and additional short bouts of activity with nursing supervision.  No acute PT needs, will defer care to nursing and consider imaging of knee if pain does not improve or worsens.  See  below for further details of exam findings.     Follow Up Recommendations Home health PT (pt concerned about associated costs)    Equipment Recommendations  Rolling walker with 5" wheels (pt concerned about associated cost)    Recommendations for Other Services       Precautions / Restrictions        Mobility  Bed Mobility Overal bed mobility: Independent                Transfers Overall transfer level: Modified independent Equipment used: None             General transfer comment: limited by stiffness to rise to full erect posture, pt with wide base initially for pain managemnt on left knee, and reluctant to let go of bed rail; p rovided cane, pt able to incr extension of trunk/hips but still stiff; instructed on repeated flexion/extension to promote flexiblity and blood flow, pt able to incr extension slightly;   Ambulation/Gait                Stairs            Wheelchair Mobility    Modified Rankin (Stroke Patients Only)       Balance                                             Pertinent Vitals/Pain Pain Assessment: 0-10 Pain Score: 4  Pain Location: left knee Pain Intervention(s): Limited activity within patient's tolerance;Monitored during session;Repositioned;Ice applied  Home Living Family/patient expects to be discharged to:: Private residence Living Arrangements: Non-relatives/Friends Available Help at Discharge: Friend(s);Available PRN/intermittently Type of Home: Apartment Home Access: Level entry     Home Layout: One level Home Equipment: Cane - single point Additional Comments: pastor providing support and care; pt concerned about any costs for postacute needs    Prior Function Level of Independence: Independent with assistive device(s)         Comments: uses cane as needed, rides SCAT for appts     Hand Dominance        Extremity/Trunk Assessment   Upper Extremity Assessment: Overall  WFL for tasks assessed           Lower Extremity Assessment: LLE deficits/detail   LLE Deficits / Details: mild pain and swelling in left knee following fall 1 wk ago, with associated tightness in lateral thigh and calf musclulature  Cervical / Trunk Assessment: Normal  Communication   Communication: No difficulties  Cognition Arousal/Alertness: Awake/alert Behavior During Therapy: WFL for tasks assessed/performed Overall Cognitive Status: Within Functional Limits for tasks assessed                      General Comments General comments (skin integrity, edema, etc.): generally stiff and sore from CHF associated edema and recovery from seizure>fall 1 week ago,     Exercises        Assessment/Plan    PT Assessment Patent does not need any further PT services  PT Diagnosis Generalized weakness   PT Problem List    PT Treatment Interventions     PT Goals (Current goals can be found in the Care Plan section) Acute Rehab PT Goals Patient Stated Goal: move better PT Goal Formulation: All assessment and education complete, DC therapy    Frequency     Barriers to discharge        Co-evaluation               End of Session   Activity Tolerance: Patient tolerated treatment well;No increased pain Patient left: in bed;with call bell/phone within reach (ice to left knee, pt/nurse/CNA instructed to remove at 20 mi) Nurse Communication: Mobility status (check response to ice; reapply as needed)         Time: 1030-1050 PT Time Calculation (min) (ACUTE ONLY): 20 min   Charges:   PT Evaluation $Initial PT Evaluation Tier I: 1 Procedure     PT G Codes:        Louis Ford 10/16/2015, 10:54 AM

## 2015-10-17 LAB — BASIC METABOLIC PANEL
Anion gap: 8 (ref 5–15)
BUN: 32 mg/dL — ABNORMAL HIGH (ref 6–20)
CALCIUM: 8.6 mg/dL — AB (ref 8.9–10.3)
CHLORIDE: 100 mmol/L — AB (ref 101–111)
CO2: 26 mmol/L (ref 22–32)
CREATININE: 1.28 mg/dL — AB (ref 0.61–1.24)
GFR calc non Af Amer: 57 mL/min — ABNORMAL LOW (ref >60)
Glucose, Bld: 107 mg/dL — ABNORMAL HIGH (ref 65–99)
Potassium: 4.3 mmol/L (ref 3.5–5.1)
SODIUM: 134 mmol/L — AB (ref 135–145)

## 2015-10-17 LAB — CARBOXYHEMOGLOBIN
Carboxyhemoglobin: 1.9 % — ABNORMAL HIGH (ref 0.5–1.5)
Methemoglobin: 0.8 % (ref 0.0–1.5)
O2 SAT: 62.5 %
Total hemoglobin: 12.6 g/dL — ABNORMAL LOW (ref 13.5–18.0)

## 2015-10-17 LAB — PROTIME-INR
INR: 2.42 — ABNORMAL HIGH (ref 0.00–1.49)
Prothrombin Time: 26 seconds — ABNORMAL HIGH (ref 11.6–15.2)

## 2015-10-17 MED ORDER — MILRINONE IN DEXTROSE 20 MG/100ML IV SOLN
0.1250 ug/kg/min | INTRAVENOUS | Status: DC
  Administered 2015-10-17 (×2): 0.125 ug/kg/min via INTRAVENOUS
  Filled 2015-10-17: qty 100

## 2015-10-17 MED ORDER — WARFARIN SODIUM 2.5 MG PO TABS
2.5000 mg | ORAL_TABLET | Freq: Once | ORAL | Status: AC
  Administered 2015-10-17: 2.5 mg via ORAL
  Filled 2015-10-17: qty 1

## 2015-10-17 MED ORDER — LISINOPRIL 2.5 MG PO TABS: 2.5000 mg | ORAL_TABLET | Freq: Every day | ORAL | Status: DC

## 2015-10-17 NOTE — Progress Notes (Signed)
CARDIAC REHAB PHASE I   PRE:  Rate/Rhythm: 96 SR  BP:  Supine:   Sitting: 127/77  Standing:    SaO2: 100%RA  MODE:  Ambulation: 130 ft   POST:  Rate/Rhythm: 134 afib during walk.  After lying in bed, 100 SR with lots of PACs  BP:  Supine:   Sitting: 139/67  Standing:    SaO2: 99%RA 1507-1540 Pt walked 130 ft with rolling walker and asst x 1 with lots of left and right knee discomfort. Greater on left. Had to stop several times to rest,leaning over walker. Heart rate elevated and a fib during walk. To bed after walk. Put left knee on pillow and filled ice bag as pt stated it had been this morning since ice applied. Still with great amount knee pain affecting ambulation.    Graylon Good, RN BSN  10/17/2015 3:36 PM

## 2015-10-17 NOTE — Care Management Important Message (Signed)
Important Message  Patient Details  Name: [facility]. MRN: [id number] Date of Birth: 06-29-1946   Medicare Important Message Given:  Yes-fourth notification given    [name] 10/17/2015, 12:18 PM

## 2015-10-17 NOTE — Progress Notes (Signed)
ANTICOAGULATION CONSULT NOTE - Follow Up Consult  Pharmacy Consult for coumadin Indication: atrial fibrillation  No Known Allergies  Patient Measurements: Height: 5\' 11"  (180.3 cm) Weight: 230 lb (104.327 kg) IBW/kg (Calculated) : 75.3 Heparin Dosing Weight:   Vital Signs: Temp: 98.1 F (36.7 C) (10/24 0555) Temp Source: Oral (10/24 0555) BP: 103/67 mmHg (10/24 0555) Pulse Rate: 80 (10/24 0555)  Labs:  Recent Labs  10/15/15 0325 10/16/15 0530  LABPROT 22.8* 24.6*  INR 2.03* 2.24*  CREATININE 1.23 1.42*    Estimated Creatinine Clearance: 62.9 mL/min (by C-G formula based on Cr of 1.42).   Medications:  Scheduled:  . amiodarone  200 mg Oral BID  . furosemide  80 mg Oral Daily  . isosorbide-hydrALAZINE  2 tablet Oral TID  . phenytoin  100 mg Oral TID  . sodium chloride  3 mL Intravenous Q12H  . sodium chloride  3 mL Intravenous Q12H  . Warfarin - Pharmacist Dosing Inpatient   Does not apply q1800   Infusions:  . milrinone 0.25 mcg/kg/min (10/16/15 1640)    Assessment: 69 yo male with afib is currently on therapeutic coumadin.  INR today is 2.4. No bleeding noted, no cbc this am.  He does continue on phenytoin and amio so will watch closely for drug interactions with INR.  Goal of Therapy:  INR 2-3 Monitor platelets by anticoagulation protocol: Yes   Plan:  Coumadin 2.5mg  x 1 PO today Daily INR  Erin Hearing PharmD., BCPS Clinical Pharmacist Pager [phone] 10/17/2015 11:35 AM

## 2015-10-17 NOTE — Progress Notes (Addendum)
Advanced Heart Failure Rounding Note  PCP: Dr Elizabeth Palau Primary Cardiologist: Dr Lovena Le  Subjective:    Remains on milrinone. Yesterday amio started for A fib.   Denies SOB. Complaining LUQ abdominal pain + nausea. Had BM on Sat. No BRBPR.    LABS today - CO-OX 63% K 4.3 Creatinine 1.28 Na 134. INR 2.42   Objective:   Weight Range: 230 lb (104.327 kg) Body mass index is 32.09 kg/(m^2).   Vital Signs:   Temp:  [97.9 F (36.6 C)-98.8 F (37.1 C)] 98.1 F (36.7 C) (10/24 0555) Pulse Rate:  [52-80] 80 (10/24 0555) Resp:  [18-21] 21 (10/24 0555) BP: (101-146)/(59-69) 103/67 mmHg (10/24 0555) SpO2:  [97 %-100 %] 97 % (10/24 0555) Weight:  [230 lb (104.327 kg)] 230 lb (104.327 kg) (10/24 0555) Last BM Date: 10/16/15  Weight change: Filed Weights   10/15/15 0500 10/16/15 0404 10/17/15 0555  Weight: 234 lb 3.2 oz (106.232 kg) 234 lb 2.1 oz (106.2 kg) 230 lb (104.327 kg)    Intake/Output:   Intake/Output Summary (Last 24 hours) at 10/17/15 0741 Last data filed at 10/17/15 0200  Gross per 24 hour  Intake 1373.47 ml  Output   2300 ml  Net -926.53 ml     Physical Exam: General: Sitting in bed NAD HEENT: normal Neck: supple. JVP 5-6 cm. R neck hematoma, improved. Carotids 2+ bilat; no bruits. No thyromegaly or nodule. Cor: PMI nondisplaced. Irregularly irregula.. No rubs, gallops or murmurs. Ecchymosis around L chest will small nodule. ICD R chest. Lungs: Clear Abdomen: soft, obese, LUQ tender. Nondistended. No HSM. No bruits or masses. Good bowel sounds. Extremities: no cyanosis, clubbing, rash,  trace edema.  Neuro: alert & orientedx3, cranial nerves grossly intact. moves all 4 extremities w/o difficulty. Affect pleasant  Telemetry: NSR 90s  Labs: CBC No results for input(s): WBC, NEUTROABS, HGB, HCT, MCV, PLT in the last 72 hours. Basic Metabolic Panel  Recent Labs  10/15/15 0325 10/16/15 0530  NA 130* 134*  K 4.8 4.2  CL 100* 101  CO2 25 24   GLUCOSE 86 86  BUN 29* 35*  CREATININE 1.23 1.42*  CALCIUM 8.4* 8.6*   Liver Function Tests No results for input(s): AST, ALT, ALKPHOS, BILITOT, PROT, ALBUMIN in the last 72 hours. No results for input(s): LIPASE, AMYLASE in the last 72 hours. Cardiac Enzymes No results for input(s): CKTOTAL, CKMB, CKMBINDEX, TROPONINI in the last 72 hours.  BNP: BNP (last 3 results)  Recent Labs  10/04/15 1604  BNP 796.9*    ProBNP (last 3 results) No results for input(s): PROBNP in the last 8760 hours.   D-Dimer No results for input(s): DDIMER in the last 72 hours. Hemoglobin A1C No results for input(s): HGBA1C in the last 72 hours. Fasting Lipid Panel No results for input(s): CHOL, HDL, LDLCALC, TRIG, CHOLHDL, LDLDIRECT in the last 72 hours. Thyroid Function Tests No results for input(s): TSH, T4TOTAL, T3FREE, THYROIDAB in the last 72 hours.  Invalid input(s): FREET3  Imaging/Studies:  No results found.   Medications:     Scheduled Medications: . amiodarone  200 mg Oral BID  . furosemide  80 mg Oral Daily  . isosorbide-hydrALAZINE  2 tablet Oral TID  . phenytoin  100 mg Oral TID  . sodium chloride  3 mL Intravenous Q12H  . sodium chloride  3 mL Intravenous Q12H  . Warfarin - Pharmacist Dosing Inpatient   Does not apply q1800    Infusions: . milrinone 0.25 mcg/kg/min (10/16/15 1640)  PRN Medications: sodium chloride, sodium chloride, acetaminophen, acetaminophen, magnesium hydroxide, ondansetron (ZOFRAN) IV, ondansetron (ZOFRAN) IV, sodium chloride, sodium chloride, sodium chloride, traMADol   Assessment   1. Acute on Chronic Systolic CHF, echo 32/02/33 LVEF 30-35%,  2. Atrial Fibrillation 3. CAD 4. HTN 5. AKI on CKD stage III - Baseline 1.1 to 1.2 6. Seizures - on phenytoin 7. Abdominal Pain   Plan  All labs pending.   Continue current milrinone for now. Increase bidil. Continue po lasix.    Now in NSR. On amio 200 mg twice daily.  Continue coumadin.    Abdominal Pain- Check KUB.   Needs CR.  Length of Stay: [place] NP-C  10/17/2015, 7:41 AM  Advanced Heart Failure Team Pager [phone] (M-F; 7a - 4p)  Please contact [facility] for night-coverage after hours (4p -7a ) and weekends on [email or web address]  Patient seen and examined with Darrick Grinder, NP. We discussed all aspects of the encounter. I agree with the assessment and plan as stated above.   He is improving though he still has multiple complaints. No on full dose Bidil. Will begin milrinone wean. Amio started yesterday and now back in NSR. Continue ambulation. Hopefully home soon vs SNF (off milrinone).  Ramie Erman,MD 11:19 AM

## 2015-10-18 DIAGNOSIS — M1000 Idiopathic gout, unspecified site: Secondary | ICD-10-CM

## 2015-10-18 LAB — BASIC METABOLIC PANEL
Anion gap: 7 (ref 5–15)
BUN: 29 mg/dL — AB (ref 6–20)
CHLORIDE: 97 mmol/L — AB (ref 101–111)
CO2: 26 mmol/L (ref 22–32)
Calcium: 8.4 mg/dL — ABNORMAL LOW (ref 8.9–10.3)
Creatinine, Ser: 1.34 mg/dL — ABNORMAL HIGH (ref 0.61–1.24)
GFR calc Af Amer: >60 mL/min (ref >60)
GFR calc non Af Amer: 54 mL/min — ABNORMAL LOW (ref >60)
Glucose, Bld: 103 mg/dL — ABNORMAL HIGH (ref 65–99)
POTASSIUM: 3.9 mmol/L (ref 3.5–5.1)
SODIUM: 130 mmol/L — AB (ref 135–145)

## 2015-10-18 LAB — CARBOXYHEMOGLOBIN
Carboxyhemoglobin: 1.8 % — ABNORMAL HIGH (ref 0.5–1.5)
METHEMOGLOBIN: 0.9 % (ref 0.0–1.5)
O2 SAT: 72.7 %
TOTAL HEMOGLOBIN: 12.6 g/dL — AB (ref 13.5–18.0)

## 2015-10-18 LAB — PROTIME-INR
INR: 2.36 — ABNORMAL HIGH (ref 0.00–1.49)
Prothrombin Time: 25.5 seconds — ABNORMAL HIGH (ref 11.6–15.2)

## 2015-10-18 MED ORDER — PREDNISONE 20 MG PO TABS
40.0000 mg | ORAL_TABLET | Freq: Every day | ORAL | Status: DC
  Administered 2015-10-18 – 2015-10-19 (×2): 40 mg via ORAL
  Filled 2015-10-18 (×2): qty 2

## 2015-10-18 MED ORDER — WARFARIN SODIUM 5 MG PO TABS
5.0000 mg | ORAL_TABLET | Freq: Once | ORAL | Status: AC
  Administered 2015-10-18: 5 mg via ORAL
  Filled 2015-10-18: qty 1

## 2015-10-18 NOTE — Progress Notes (Signed)
Advanced Heart Failure Rounding Note  PCP: Dr Elizabeth Palau Primary Cardiologist: Dr Lovena Le  Subjective:    Remains on milrinone. 10/16/15 amio started for A fib.   Denies SOB. Continues to have intermittent LUQ abdominal pain, but no nausea. Having BMs. No BRBPR.  Now complaining of swelling in Left thumb and index finger.  Says he was treated in past for gout for same thing in right hand.   LABS today - CO-OX 72.7% K 3.9 Creatinine 1.34 Na 130. INR 2.36  Objective:   Weight Range: 230 lb 9.6 oz (104.599 kg) Body mass index is 32.18 kg/(m^2).   Vital Signs:   Temp:  [98.1 F (36.7 C)-98.6 F (37 C)] 98.6 F (37 C) (10/25 0415) Pulse Rate:  [81-98] 87 (10/25 0415) Resp:  [18-21] 20 (10/25 0415) BP: (108-125)/(51-89) 125/89 mmHg (10/25 0415) SpO2:  [97 %-100 %] 97 % (10/25 0415) Weight:  [230 lb 9.6 oz (104.599 kg)] 230 lb 9.6 oz (104.599 kg) (10/25 0415) Last BM Date: 10/16/15  Weight change: Filed Weights   10/16/15 0404 10/17/15 0555 10/18/15 0415  Weight: 234 lb 2.1 oz (106.2 kg) 230 lb (104.327 kg) 230 lb 9.6 oz (104.599 kg)    Intake/Output:   Intake/Output Summary (Last 24 hours) at 10/18/15 4650 Last data filed at 10/18/15 0600  Gross per 24 hour  Intake 1019.23 ml  Output   2000 ml  Net -980.77 ml     Physical Exam: General: In bed, NAD HEENT: normal Neck: supple. JVP flat. Carotids 2+ bilat; no bruits. No thyromegaly or lymphadenopathy appreciated. Cor: PMI nondisplaced. Irregularly irregular. No M/G/R. Fading ecchymosis around L chest will small nodule. ICD R chest. Lungs: CTA, normal effort Abdomen: soft, obese, LUQ tender. No guarding or rebound. ND. No HSM. No bruits or masses. +BS Extremities: no cyanosis, clubbing, rash, edema. L hand with swollen thumb PIP and index finger PIP. Neuro: alert & orientedx3, cranial nerves grossly intact. moves all 4 extremities w/o difficulty. Affect flat  Telemetry: MAT 90s   Labs: CBC No  results for input(s): WBC, NEUTROABS, HGB, HCT, MCV, PLT in the last 72 hours. Basic Metabolic Panel  Recent Labs  10/17/15 0500 10/18/15 0538  NA 134* 130*  K 4.3 3.9  CL 100* 97*  CO2 26 26  GLUCOSE 107* 103*  BUN 32* 29*  CREATININE 1.28* 1.34*  CALCIUM 8.6* 8.4*   Liver Function Tests No results for input(s): AST, ALT, ALKPHOS, BILITOT, PROT, ALBUMIN in the last 72 hours. No results for input(s): LIPASE, AMYLASE in the last 72 hours. Cardiac Enzymes No results for input(s): CKTOTAL, CKMB, CKMBINDEX, TROPONINI in the last 72 hours.  BNP: BNP (last 3 results)  Recent Labs  10/04/15 1604  BNP 796.9*    ProBNP (last 3 results) No results for input(s): PROBNP in the last 8760 hours.   D-Dimer No results for input(s): DDIMER in the last 72 hours. Hemoglobin A1C No results for input(s): HGBA1C in the last 72 hours. Fasting Lipid Panel No results for input(s): CHOL, HDL, LDLCALC, TRIG, CHOLHDL, LDLDIRECT in the last 72 hours. Thyroid Function Tests No results for input(s): TSH, T4TOTAL, T3FREE, THYROIDAB in the last 72 hours.  Invalid input(s): FREET3  Imaging/Studies:  No results found.   Medications:     Scheduled Medications: . amiodarone  200 mg Oral BID  . furosemide  80 mg Oral Daily  . isosorbide-hydrALAZINE  2 tablet Oral TID  . phenytoin  100 mg Oral TID  .  sodium chloride  3 mL Intravenous Q12H  . sodium chloride  3 mL Intravenous Q12H  . Warfarin - Pharmacist Dosing Inpatient   Does not apply q1800    Infusions: . milrinone 0.125 mcg/kg/min (10/17/15 1703)    PRN Medications: sodium chloride, sodium chloride, acetaminophen, magnesium hydroxide, ondansetron (ZOFRAN) IV, ondansetron (ZOFRAN) IV, sodium chloride, sodium chloride, sodium chloride, traMADol   Assessment   1. Acute on Chronic Systolic CHF, echo 75/88/32 LVEF 30-35%,  2. Atrial Fibrillation 3. CAD 4. HTN 5. AKI on CKD stage III - Baseline 1.1 to 1.2 6. Seizures - on  phenytoin 7. Abdominal Pain  8. Gout, acute  Plan   Now on full dose bidil. Continue po lasix.  Will likely d/c milrinone later this am for home/SNF tomorrow vs Thursday.   Was treated for gout in 03/2015 with prednisone. Will give prednisone 40 mg x 3 days.  Last BM Saturday.  Abd waxing and waning. Had complete relief for a time yesterday. Will hold off imaging for now.   Now in NSR/MAT. On amio 200 mg twice daily.  Continue coumadin.   Cardiac rehab following.  Length of Stay: [name] PA-C  10/18/2015, 7:12 AM  Advanced Heart Failure Team Pager [phone] (M-F; 7a - 4p)  Please contact [facility] for night-coverage after hours (4p -7a ) and weekends on [email or web address]  Co-ox much improved will wean milrinone to off. Volume status looks good. Now with MAT on amiodarone. Will continue. Continue coumadin.   Hopefully to SNF tomorrow or Thursday.  Louis Hornstein,MD 10:28 AM

## 2015-10-18 NOTE — Evaluation (Signed)
Physical Therapy Evaluation Patient Details Name: Louis Ford: Lambert Rhonda W. MRN: [id number] DOB: [birth year] Today's Date: 10/18/2015   History of Present Illness  69 y.o. male with hx of CAD s/p CABG, ICM s/p Biotronik ICD, chronic systolic CHF Echo 06/16/75 LVEF 30-35%; normal RV; PA peak pressure 34 mm Hg, HTN, HLD, atrial fibrillation, previous stroke, Factor 7 deficiency, and seizure disorder. He presented to [facility] office on 10/04/15 with worsening SOB and Afib RVR and was admitted for HF management. Prior to this month could walk ~ 3 block round trip without difficulty. Since fall/seizure on 09/26/15 has had SOB walking to bathroom and back. No SOB at rest or PND. Chronic 3 pillow orthopnea.  Clinical Impression  Pt admitted with above diagnosis. Pt currently with functional limitations due to the deficits listed below (see PT Problem List). Pt will benefit from PT to address strength and balance issues.  Needs SNF prior to d/c home.  Will follow acutely.  Pt will benefit from skilled PT to increase their independence and safety with mobility to allow discharge to the venue listed below.      Follow Up Recommendations SNF;Supervision/Assistance - 24 hour    Equipment Recommendations  Rolling walker with 5" wheels    Recommendations for Other Services       Precautions / Restrictions Precautions Precautions: Fall Restrictions Weight Bearing Restrictions: No      Mobility  Bed Mobility Overal bed mobility: Independent                Transfers Overall transfer level: Modified independent Equipment used: None             General transfer comment: limited by stiffness to rise to full erect posture,  pt able to incr extension of trunk/hips but still stiff,Pt needed constant cues to stand tall.    Ambulation/Gait Ambulation/Gait assistance: Min guard Ambulation Distance (Feet): 200 Feet Assistive device: Rolling walker (2 wheeled) Gait Pattern/deviations: Step-through  pattern;Decreased stride length;Trunk flexed;Drifts right/left   Gait velocity interpretation: Below normal speed for age/gender General Gait Details: Pt able to ambulate down hall.  Slightly shaky at times.  Cues needed to stay close to RW.   Stairs            Wheelchair Mobility    Modified Rankin (Stroke Patients Only)       Balance Overall balance assessment: Needs assistance         Standing balance support: Bilateral upper extremity supported;During functional activity Standing balance-Leahy Scale: Poor Standing balance comment: Needed Ue support for balance.                              Pertinent Vitals/Pain Pain Assessment: Faces Faces Pain Scale: Hurts little more Pain Location: left hand Pain Descriptors / Indicators: Aching;Sore Pain Intervention(s): Limited activity within patient's tolerance;Monitored during session;Repositioned  VSS    Home Living Family/patient expects to be discharged to:: Private residence Living Arrangements: Non-relatives/Friends Available Help at Discharge: Friend(s);Available PRN/intermittently Type of Home: Apartment Home Access: Level entry     Home Layout: One level Home Equipment: Cane - single point Additional Comments: pastor providing support and care; pt concerned about any costs for postacute needs    Prior Function Level of Independence: Independent with assistive device(s)         Comments: uses cane as needed, rides SCAT for appts     Hand Dominance   Dominant Hand: Right    Extremity/Trunk Assessment  Upper Extremity Assessment: Defer to OT evaluation           Lower Extremity Assessment: LLE deficits/detail   LLE Deficits / Details: mild pain and swelling in left knee following fall 1 wk ago, with associated tightness in lateral thigh and calf musclulature  Cervical / Trunk Assessment: Kyphotic  Communication   Communication: No difficulties  Cognition Arousal/Alertness:  Awake/alert Behavior During Therapy: WFL for tasks assessed/performed Overall Cognitive Status: Within Functional Limits for tasks assessed                      General Comments      Exercises General Exercises - Lower Extremity Ankle Circles/Pumps: AROM;Both;5 reps;Seated Long Arc Quad: AROM;Both;10 reps;Seated      Assessment/Plan    PT Assessment Patient needs continued PT services  PT Diagnosis Generalized weakness   PT Problem List Decreased strength;Decreased activity tolerance;Decreased balance;Decreased mobility;Decreased knowledge of use of DME;Decreased safety awareness;Pain  PT Treatment Interventions DME instruction;Gait training;Functional mobility training;Therapeutic activities;Therapeutic exercise;Balance training;Patient/family education   PT Goals (Current goals can be found in the Care Plan section) Acute Rehab PT Goals Patient Stated Goal: move better PT Goal Formulation: With patient Time For Goal Achievement: 10/25/15 Potential to Achieve Goals: Good    Frequency Min 3X/week   Barriers to discharge        Co-evaluation               End of Session Equipment Utilized During Treatment: Gait belt Activity Tolerance: Patient limited by fatigue Patient left: in chair;with call bell/phone within reach;with chair alarm set Nurse Communication: Mobility status         Time: 9211-9417 PT Time Calculation (min) (ACUTE ONLY): 18 min   Charges:   PT Evaluation $Initial PT Evaluation Tier I: 1 Procedure     PT G CodesDenice Paradise Oct 27, 2015, 12:12 PM Kent Frieda Arnall,PT Acute Rehabilitation [phone] (pager)

## 2015-10-18 NOTE — Progress Notes (Signed)
Pt sleeping soundly, difficult to wake. Declined walking at this time. Did some teach back education with pt. He was not able to recall 2000 mg sodium, just "no salt". Discussed this some however pt sleepy still. Will f/u tomorrow. Pt sts he needs to buy a scale at [facility]. [place], ACSM 2:52 PM 10/18/2015

## 2015-10-18 NOTE — Progress Notes (Signed)
Patient weak, deconditioned, get short of breath with ambulation, would benefit from short term SNF. CM talked to patient and his Glennon Mac - they are in agreement to go to SNF. Karleen Hampshire Worker to follow case; Aneta Mins [phone]

## 2015-10-18 NOTE — Progress Notes (Signed)
ANTICOAGULATION CONSULT NOTE - Follow Up Consult  Pharmacy Consult for Coumadin Indication: atrial fibrillation  No Known Allergies  Patient Measurements: Height: 5\' 11"  (180.3 cm) Weight: 230 lb 9.6 oz (104.599 kg) IBW/kg (Calculated) : 75.3 Heparin Dosing Weight:   Vital Signs: Temp: 99 F (37.2 C) (10/25 0800) Temp Source: Oral (10/25 0800) BP: 126/74 mmHg (10/25 1042) Pulse Rate: 88 (10/25 1042)  Labs:  Recent Labs  10/16/15 0530 10/17/15 0500 10/18/15 0538  LABPROT 24.6* 26.0* 25.5*  INR 2.24* 2.42* 2.36*  CREATININE 1.42* 1.28* 1.34*    Estimated Creatinine Clearance: 66.7 mL/min (by C-G formula based on Cr of 1.34).  Assessment: 31 yom with afib is currently on therapeutic coumadin.  INR today is 2.36. No bleeding reported. He does continue on phenytoin (as pta) and amiodarone (new) so will watch closely for drug interactions with INR.  PTA dose: 5mg  daily  Goal of Therapy:  INR 2-3 Monitor platelets by anticoagulation protocol: Yes   Plan:  1) Coumadin 5mg  x 1 tonight 2) Daily INR  Nena Jordan PharmD., BCPS 10/18/2015 11:10 AM

## 2015-10-19 ENCOUNTER — Inpatient Hospital Stay (HOSPITAL_COMMUNITY): Payer: Medicare Other

## 2015-10-19 LAB — BASIC METABOLIC PANEL
ANION GAP: 14 (ref 5–15)
BUN: 33 mg/dL — ABNORMAL HIGH (ref 6–20)
CHLORIDE: 96 mmol/L — AB (ref 101–111)
CO2: 22 mmol/L (ref 22–32)
Calcium: 9 mg/dL (ref 8.9–10.3)
Creatinine, Ser: 1.33 mg/dL — ABNORMAL HIGH (ref 0.61–1.24)
GFR calc non Af Amer: 54 mL/min — ABNORMAL LOW (ref >60)
Glucose, Bld: 111 mg/dL — ABNORMAL HIGH (ref 65–99)
Potassium: 4.5 mmol/L (ref 3.5–5.1)
Sodium: 132 mmol/L — ABNORMAL LOW (ref 135–145)

## 2015-10-19 LAB — CARBOXYHEMOGLOBIN
CARBOXYHEMOGLOBIN: 2.2 % — AB (ref 0.5–1.5)
Methemoglobin: 0.6 % (ref 0.0–1.5)
O2 SAT: 90.6 %
TOTAL HEMOGLOBIN: 12.8 g/dL — AB (ref 13.5–18.0)

## 2015-10-19 LAB — PROTIME-INR
INR: 2.24 — ABNORMAL HIGH (ref 0.00–1.49)
Prothrombin Time: 24.6 seconds — ABNORMAL HIGH (ref 11.6–15.2)

## 2015-10-19 MED ORDER — PREDNISONE 20 MG PO TABS
40.0000 mg | ORAL_TABLET | Freq: Every day | ORAL | Status: AC
  Administered 2015-10-20: 40 mg via ORAL
  Filled 2015-10-19: qty 2

## 2015-10-19 MED ORDER — LOSARTAN POTASSIUM 25 MG PO TABS
25.0000 mg | ORAL_TABLET | Freq: Every day | ORAL | Status: DC
  Administered 2015-10-19 – 2015-10-20 (×2): 25 mg via ORAL
  Filled 2015-10-19: qty 1

## 2015-10-19 MED ORDER — WARFARIN SODIUM 5 MG PO TABS
5.0000 mg | ORAL_TABLET | Freq: Once | ORAL | Status: AC
Start: 2015-10-19 — End: 2015-10-19
  Administered 2015-10-19: 5 mg via ORAL
  Filled 2015-10-19: qty 1

## 2015-10-19 MED ORDER — SORBITOL 70 % PO SOLN
30.0000 mL | Freq: Every day | ORAL | Status: DC | PRN
  Administered 2015-10-19: 30 mL via ORAL
  Filled 2015-10-19 (×3): qty 30

## 2015-10-19 NOTE — NC FL2 (Deleted)
[place] MEDICAID FL2 LEVEL OF CARE SCREENING TOOL     IDENTIFICATION  Patient Name: [facility]. Birthdate: [birth year] Sex: male Admission Date (Current Location): 10/04/2015  [facility] and [place] Number: Herbalist and Address:  [facility], [place] [address] [place], [place] [address]      Provider Number: [id number]  Attending Physician Name and Address:  Burnell Blanks, *  Relative Name and Phone Number:       Current Level of Care: Hospital Recommended Level of Care: [place] Prior Approval Number:    Date Approved/Denied:   PASRR Number:  [id number]  Discharge Plan: SNF    Current Diagnoses: Patient Active Problem List   Diagnosis Date Noted  . Acute on chronic systolic (congestive) heart failure ([place])   . CHF (congestive heart failure), NYHA class III ([place]) 10/04/2015  . Loss of consciousness 01/05/2015  . Afib ([place]) 04/21/2014  . Encounter for therapeutic drug monitoring 01/19/2014  . Atrial fibrillation ([place]) 01/01/2014  . Long term current use of anticoagulant 01/01/2014  . Chronic systolic heart failure ([place]) 11/06/2012  . Factor VII deficiency ([place]) 10/07/2012  . Coagulopathy ([place]) 10/03/2012  . Seizure ([place]) 10/01/2012  . Renal insufficiency 10/01/2012  . Ischemic cardiomyopathy  ? additional rate component 09/29/2012  . Automatic implantable cardioverter-defibrillator in situ   . Epistaxis 09/27/2012  . Odynophagia 09/26/2012  . Acute on chronic systolic heart failure ([place]) 09/25/2012  . Atrial fibrillation   09/22/2012  . Dyspnea 09/21/2012  . HTN (hypertension) 09/21/2012  . Accelerated hypertension 09/21/2012  . CAD (coronary artery disease) s/p cabg 09/21/2012    Orientation ACTIVITIES/SOCIAL BLADDER RESPIRATION  Self (A & o X 4) Active (Friend: [name]) Continent Normal  BEHAVIORAL SYMPTOMS/MOOD NEUROLOGICAL BOWEL NUTRITION STATUS      Continent Diet (Heart  Healthy)  PHYSICIAN VISITS COMMUNICATION OF NEEDS Height & Weight Skin  30 days Verbally 5\' 11"  (180.3 cm) 229 lbs. Other (Comment) (PEG site -abdomen)          AMBULATORY STATUS RESPIRATION    Assist extensive Normal      Personal Care Assistance Level of Assistance  Bathing, Dressing Bathing Assistance: Limited assistance Feeding assistance: Limited assistance Dressing Assistance: Limited assistance      Functional Limitations Info                [place]  PT (By licensed PT), Speech therapy (ST)     PT Frequency: 5       Speech Therapy Frequency: 3     Additional Factors Info  Allergies, Code Status Code Status Info: Full Allergies Info: NKA           Current Medications (10/19/2015): Current Facility-Administered Medications  Medication Dose Route Frequency Provider Last Rate Last Dose  . 0.9 %  sodium chloride infusion  250 mL Intravenous PRN [name], MD      . 0.9 %  sodium chloride infusion  250 mL Intravenous PRN [name], MD 5 mL/hr at 10/12/15 1155 250 mL at 10/12/15 1155  . acetaminophen (TYLENOL) tablet 650 mg  650 mg Oral Q4H PRN Burnell Blanks, MD   650 mg at 10/10/15 0906  . amiodarone (PACERONE) tablet 200 mg  200 mg Oral BID [name], MD   200 mg at 10/19/15 1013  . furosemide (LASIX) tablet 80 mg  80 mg Oral Daily [name], MD   80 mg at  10/19/15 1013  . isosorbide-hydrALAZINE (BIDIL) 20-37.5 MG per tablet 2 tablet  2 tablet Oral TID [name], MD   2 tablet at 10/19/15 1020  . losartan (COZAAR) tablet 25 mg  25 mg Oral Daily [name] [name], MD      . magnesium hydroxide (MILK OF MAGNESIA) suspension 30 mL  30 mL Oral Daily PRN Burnell Blanks, MD      . ondansetron [facility]) injection 4 mg  4 mg Intravenous Q6H PRN Burnell Blanks, MD      . ondansetron [facility]) injection 4 mg  4 mg Intravenous Q6H PRN [name], MD      . phenytoin  (DILANTIN) ER capsule 100 mg  100 mg Oral TID Burnell Blanks, MD   100 mg at 10/19/15 1013  . predniSONE (DELTASONE) tablet 40 mg  40 mg Oral Q breakfast Shirley Friar, PA-C   40 mg at 10/19/15 8280  . sodium chloride 0.9 % injection 10-40 mL  10-40 mL Intracatheter PRN Burnell Blanks, MD   10 mL at 10/19/15 0413  . sodium chloride 0.9 % injection 3 mL  3 mL Intravenous Q12H [name], MD   3 mL at 10/17/15 1000  . sodium chloride 0.9 % injection 3 mL  3 mL Intravenous PRN [name], MD      . sodium chloride 0.9 % injection 3 mL  3 mL Intravenous Q12H [name], MD   3 mL at 10/17/15 1000  . sodium chloride 0.9 % injection 3 mL  3 mL Intravenous PRN [name], MD      . sorbitol 70 % solution 30 mL  30 mL Oral Daily PRN [name], MD      . traMADol Veatrice Bourbon) tablet 50 mg  50 mg Oral Q6H PRN [name], MD   50 mg at 10/18/15 1812  . Warfarin - Pharmacist Dosing Inpatient   Does not apply [place], [facility]       Do not use this list as official medication orders. Please verify with discharge summary.  Discharge Medications:   Medication List    ASK your doctor about these medications        acetaminophen 325 MG tablet  Commonly known as:  TYLENOL  Take 650 mg by mouth every 6 (six) hours as needed for mild pain.     carvedilol 25 MG tablet  Commonly known as:  COREG  Take 1 tablet (25 mg total) by mouth 2 (two) times daily.     furosemide 40 MG tablet  Commonly known as:  LASIX  Take 2 tablets (80 mg total) by mouth daily.     lisinopril 5 MG tablet  Commonly known as:  PRINIVIL,ZESTRIL  Take 1 tablet (5 mg total) by mouth daily.     phenytoin 100 MG ER capsule  Commonly known as:  DILANTIN  Take 1 capsule (100 mg total) by mouth 3 (three) times daily.     warfarin 5 MG tablet  Commonly known as:  COUMADIN  TAKE AS DIRECTED BY  COUMADIN  CLINIC        Relevant Imaging  Results:  Relevant Lab Results:  Recent Labs    Additional Information    Williemae Area, KLKJ [phone]

## 2015-10-19 NOTE — Progress Notes (Signed)
Nutrition Brief Note  Patient identified on the Malnutrition Screening Tool (MST) Report  Wt Readings from Last 15 Encounters:  10/19/15 229 lb 8 oz (104.1 kg)  10/04/15 247 lb (112.038 kg)  09/26/15 250 lb (113.399 kg)  07/08/15 248 lb (112.492 kg)  04/15/15 242 lb 7 oz (109.969 kg)  09/22/14 206 lb (93.441 kg)  07/06/14 206 lb (93.441 kg)  04/22/14 202 lb 14.4 oz (92.035 kg)  11/27/13 212 lb (96.163 kg)  12/02/12 213 lb 6.4 oz (96.798 kg)  11/10/12 217 lb 9.6 oz (98.703 kg)  11/10/12 218 lb 8 oz (99.111 kg)  11/06/12 222 lb (100.699 kg)  10/10/12 198 lb 3.1 oz (89.9 kg)    Body mass index is 32.02 kg/(m^2). Patient meets criteria for Obesity based on current BMI.   Current diet order is Heart Healthy, patient is consuming approximately 75% of meals at this time. Pt reports having a varied appetite, eating 75% of most meals, 50% of some, 100% of others. Overall, he feels he is eating adequately and he denies any weight loss unrelated to fluid. He states he was eating well PTA. He reports he has received all of the low sodium information he needs and declines nutrition education at this time. Pt appears well nourished, Labs and medications reviewed.   No nutrition interventions warranted at this time. If nutrition issues arise, please consult RD.   Scarlette Ar RD, LDN Inpatient Clinical Dietitian Pager: [phone] After Hours Pager: [phone]

## 2015-10-19 NOTE — Clinical Social Work Note (Signed)
Clinical Social Work Assessment  Patient Details  Name: Louis Santoni Jr. MRN: [id number] Date of Birth: 11/21/1946  Date of referral:  10/18/15               Reason for consult:  Facility Placement                Permission sought to share information with:  Facility Contact Representative, Family Supports Permission granted to share information::  Yes, Verbal Permission Granted  Name::     [place] SNF's, Friend/Pastor Patricia Smith   Housing/Transportation Living arrangements for the past 2 months:  Single Family Home Source of Information:  Patient, Friend/Neighbor Patient Interpreter Needed:  None Criminal Activity/Legal Involvement Pertinent to Current Situation/Hospitalization:  No - Comment as needed Significant Relationships:  Friend (Friend/Pastor, Friend's son) Lives with:  Friends Do you feel safe going back to the place where you live?  Yes Need for family participation in patient care:  Yes (Comment)  Care giving concerns:  Lives at home with friend; has had recent increased weakness and inability to manage CHF.  "I've had fluid build up".  Social Worker assessment / plan:  CSW met with patient to discuss MD and PT's recommendation for short term rehab.  Patient is alert, oriented and pleasant.  States he is willing to go but defers all decision making to his Friend and Pastor Patricia.  "She helps me with everything."  CSW explained benefits of short term rehab and Patricia is very supportive of SNF placement. She prefers placement at [facility] as it is convenient to where she lives.  Fl2 will be initiated and active bed search started. Will ask [facility] rep to meet with patient and friend.    Employment status:  Retired Insurance information:  Managed Medicare PT Recommendations:  Skilled Nursing Facility Information / Referral to community resources:  Skilled Nursing Facility  Patient/Family's Response to care:  Patient denied any concerns or  problems with his current care. He states that he is feeling better and agrees to SNF placement "if Patricia wants me to go.".  Patient/Family's Understanding of and Emotional Response to Diagnosis, Current Treatment, and Prognosis:  Patient does not appear to have a strong understanding of his current diagnosis, treatment or prognosis.  He defers all medical questions/decisions to his friend Patricia even though he is alert and oriented.    Emotional Assessment Appearance:  Appears stated age Attitude/Demeanor/Rapport:   (Cooperative, distracted) Affect (typically observed):  Quiet, Calm, Pleasant (defers to his friend/pastor) Orientation:  Oriented to Self, Oriented to Place, Oriented to  Time, Oriented to Situation Alcohol / Substance use:  Tobacco Use (Former smoker- states he stoppped about 6 years ago) Psych involvement (Current and /or in the community):  No (Comment)  Discharge Needs  Concerns to be addressed:  Care Coordination Readmission within the last 30 days:  No Current discharge risk:  None Barriers to Discharge:  Continued Medical Work up   ,  T, LCSW 10/18/2015  4:25 PM 

## 2015-10-19 NOTE — Progress Notes (Signed)
Advanced Heart Failure Rounding Note  PCP: Dr Elizabeth Palau Primary Cardiologist: Dr Lovena Le  Subjective:    Milrinone stopped 10/18/15.  Amio started for A fib 10/16/15  Hand feels slightly better on prednisone. Still no BM.  Abd pain continues.  Has been walking short distances without difficulty.    CO-OX 90.6% off milrinone. K 4.5 Creatinine 1.33 Na 132. INR 2.24  Objective:   Weight Range: 229 lb 8 oz (104.1 kg) Body mass index is 32.02 kg/(m^2).   Vital Signs:   Temp:  [97.4 F (36.3 C)-98.3 F (36.8 C)] 98.3 F (36.8 C) (10/26 1740) Pulse Rate:  [73-88] 81 (10/26 0633) Resp:  [18-20] 20 (10/26 8144) BP: (105-130)/(67-90) 120/90 mmHg (10/26 0633) SpO2:  [98 %-100 %] 100 % (10/26 8185) Weight:  [229 lb 8 oz (104.1 kg)] 229 lb 8 oz (104.1 kg) (10/26 0633) Last BM Date: 10/16/15  Weight change: Filed Weights   10/17/15 0555 10/18/15 0415 10/19/15 0633  Weight: 230 lb (104.327 kg) 230 lb 9.6 oz (104.599 kg) 229 lb 8 oz (104.1 kg)    Intake/Output:   Intake/Output Summary (Last 24 hours) at 10/19/15 0848 Last data filed at 10/19/15 0034  Gross per 24 hour  Intake    702 ml  Output   1850 ml  Net  -1148 ml     Physical Exam: General: In bed, NAD HEENT: Slightly tender R neck, unimpressive on exam. Neck: supple. JVP not elevated. Carotids 2+ bilat; no bruits. No thyromegaly or nodule noted Cor: PMI nondisplaced. Irregularly irregular. No M/G/R. Improved ecchymosis around L chest will small nodule. ICD R chest. Lungs: CTA, normal effort Abdomen: soft, obese, LUQ tender out of proportion to exam. No guarding or rebound. ND. No HSM. No bruits or masses. +BS Extremities: no cyanosis, clubbing, rash, edema. L hand with swollen thumb PIP and index finger PIP. Neuro: alert & orientedx3, cranial nerves grossly intact. moves all 4 extremities w/o difficulty. Affect flat  Telemetry: WAP 80s   Labs: CBC No results for input(s): WBC, NEUTROABS, HGB, HCT,  MCV, PLT in the last 72 hours. Basic Metabolic Panel  Recent Labs  10/18/15 0538 10/19/15 0412  NA 130* 132*  K 3.9 4.5  CL 97* 96*  CO2 26 22  GLUCOSE 103* 111*  BUN 29* 33*  CREATININE 1.34* 1.33*  CALCIUM 8.4* 9.0   Liver Function Tests No results for input(s): AST, ALT, ALKPHOS, BILITOT, PROT, ALBUMIN in the last 72 hours. No results for input(s): LIPASE, AMYLASE in the last 72 hours. Cardiac Enzymes No results for input(s): CKTOTAL, CKMB, CKMBINDEX, TROPONINI in the last 72 hours.  BNP: BNP (last 3 results)  Recent Labs  10/04/15 1604  BNP 796.9*    ProBNP (last 3 results) No results for input(s): PROBNP in the last 8760 hours.   D-Dimer No results for input(s): DDIMER in the last 72 hours. Hemoglobin A1C No results for input(s): HGBA1C in the last 72 hours. Fasting Lipid Panel No results for input(s): CHOL, HDL, LDLCALC, TRIG, CHOLHDL, LDLDIRECT in the last 72 hours. Thyroid Function Tests No results for input(s): TSH, T4TOTAL, T3FREE, THYROIDAB in the last 72 hours.  Invalid input(s): FREET3  Imaging/Studies:  No results found.   Medications:     Scheduled Medications: . amiodarone  200 mg Oral BID  . furosemide  80 mg Oral Daily  . isosorbide-hydrALAZINE  2 tablet Oral TID  . phenytoin  100 mg Oral TID  . predniSONE  40 mg  Oral Q breakfast  . sodium chloride  3 mL Intravenous Q12H  . sodium chloride  3 mL Intravenous Q12H  . Warfarin - Pharmacist Dosing Inpatient   Does not apply q1800    Infusions:    PRN Medications: sodium chloride, sodium chloride, acetaminophen, magnesium hydroxide, ondansetron (ZOFRAN) IV, ondansetron (ZOFRAN) IV, sodium chloride, sodium chloride, sodium chloride, traMADol   Assessment   1. Acute on Chronic Systolic CHF, echo 99/77/41 LVEF 30-35%,  2. Atrial Fibrillation 3. CAD 4. HTN 5. AKI on CKD stage III - Baseline 1.1 to 1.2 6. Seizures - on phenytoin 7. Abdominal Pain  8. Gout, acute  Plan   Now  on full dose bidil. Continue po lasix.  Doing well off milrinone.  Likely OK for SNF once arranged.   Prednisone 40 mg x 3 days (10/25-10/27) for gout flare  Continues to have Abd pain, worse with coughing. Seems very tender, but also grimaces before he is even touched. Will get KUB.  No BM x 3 days.  Now in NSR/MAT. On amio 200 mg twice daily.  Continue coumadin.   Cardiac rehab following.  Length of Stay: [address]  Shirley Friar PA-C  10/19/2015, 8:48 AM  Advanced Heart Failure Team Pager [phone] (M-F; 7a - 4p)  Please contact [facility] for night-coverage after hours (4p -7a ) and weekends on [email or web address]  Much improved. Continue current regimen. KUB shows high stool volume will give sorbitol. Ready for d/c to SNF in am. Would d/c on amio 200 daily. Start low-dose losartan. No b-blocker yet given recent need for inotropes.   Tanasha Menees,MD 11:28 AM

## 2015-10-19 NOTE — Discharge Summary (Addendum)
Advanced Heart Failure Team  Discharge Summary   Patient ID: Louis Ford. MRN: [id number], DOB/AGE: [birth year] 69 y.o. Admit date: 10/04/2015 D/C date:     10/20/2015   Primary Discharge Diagnoses:  1. Acute on Chronic Systolic CHF, echo 09/38/18 LVEF 30-35%,  2. Paroxysmal Atrial Fibrillation -converted on amiodarone this admission 3. CAD s/p CABG 4. HTN 5. AKI on CKD stage III,resolved - Baseline 1.1 to 1.2 6. Seizures - on phenytoin 7. Gout, acute  [name] is a 69 y.o. male with hx of CAD s/p CABG, ICM s/p Biotronik ICD, chronic systolic CHF Echo 29/93/71 LVEF 30-35%; normal RV; PA peak pressure 34 mm Hg, HTN, HLD, atrial fibrillation, previous stroke, Factor 7 deficiency, and seizure disorder who presented to [facility] on 10/04/15 with worsening SOB and Afib RVR and was admitted for same with 40 lb weight gain over the past year.    He diuresed well initially on IV lasix 40 mg BID, but continued to have SOB and was thus on milrinone  on 10/14 for concerns of low output. Symptoms improved on milrinone but pt continued to have dyspnea with light exertion. HF team consulted on 10/11/15. Milrinone held for [place] on 10/12/15 that showed markedly depressed cardiac output with Fick CO/CI 3.6/1.6. Milrinone restarted,   Pt did develop small hematoma s/p RHC and hoarseness. Thought to be 2/2 swelling pressing against recurrent laryngeal nerve, and this resolved spontaneously.  Pt initially refused PICC placement but spoke with his Doristine Bosworth and decided to proceed. He was not an ideal home inotrope candidate with questionable social support and limited insight, and Milrinone was weaned down as Bidil was increased to goal dose. CO-OX remained stable at 60-70% throughout wean and off milrinone.  PT saw and recommended SNF for 24 hours supervision.  Pt agreed after speaking with pastor and [facility] chosen. Cardiac rehab saw as well, Not able to tolerate even a  moderate amount of exercise.  [place]ord course additionally complicated by gout flare in left hand that improved with 3 days of prednisone, and constipation that improved with sorbitol and mag citrate. Afib initially rate controlled with BB that was held once low output suspected. Pt had rates up into 150s with exertion on 10/16/15 so started on amio for rate control ann chemically converted after 2 doses of po to MAT, will continue with po amio on discharge. PICC line was removed prior to discharge.  Overall he has diuresed 7.6L and down 15 lbs from admit. He will be discharged to [facility] SNF in stable condition with close HF follow up as below.  Appropriate medical regimen as below.    Discharge Weight Range: 229 lbs Discharge Vitals: Blood pressure 123/88, pulse 88, temperature 97.9 F (36.6 C), temperature source Oral, resp. rate 20, height 5\' 11"  (1.803 m), weight 225 lb 10.2 oz (102.35 kg), SpO2 100 %.  Labs: Lab Results  Component Value Date   WBC 5.4 10/10/2015   HGB 12.0* 10/10/2015   HCT 37.7* 10/10/2015   MCV 89.1 10/10/2015   PLT 253 10/10/2015     Recent Labs Lab 10/20/15 0516  NA 133*  K 4.2  CL 97*  CO2 27  BUN 36*  CREATININE 1.30*  CALCIUM 9.2  GLUCOSE 76   Lab Results  Component Value Date   CHOL 133 09/22/2012   HDL 37* 09/22/2012   LDLCALC 89 09/22/2012   TRIG 35 09/22/2012   BNP (last 3 results)  Recent Labs  10/04/15 1604  BNP 796.9*    ProBNP (last 3 results) No results for input(s): PROBNP in the last 8760 hours.   Diagnostic Studies/Procedures   Dg Abd 1 View  10/19/2015  CLINICAL DATA:  MID ABDOMEN DISCOMFORT WHEN COUGHING.NO BM FOR 4 DAYS EXAM: ABDOMEN - 1 VIEW COMPARISON:  None. FINDINGS: Bowel gas pattern is nonobstructive. Moderate stool burden is noted. Degenerative changes state lower lumbar spine. No evidence for organomegaly. No abnormal calcifications. IMPRESSION: Moderate stool burden. No evidence for acute   abnormality. Electronically Signed   By: Nolon Nations M.D.   On: 10/19/2015 10:04    Discharge Medications     Medication List    STOP taking these medications        carvedilol 25 MG tablet  Commonly known as:  COREG     lisinopril 5 MG tablet  Commonly known as:  PRINIVIL,ZESTRIL      TAKE these medications        acetaminophen 325 MG tablet  Commonly known as:  TYLENOL  Take 650 mg by mouth every 6 (six) hours as needed for mild pain.     amiodarone 200 MG tablet  Commonly known as:  PACERONE  Take 1 tablet (200 mg total) by mouth daily.     furosemide 40 MG tablet  Commonly known as:  LASIX  Take 2 tablets (80 mg total) by mouth daily.     isosorbide-hydrALAZINE 20-37.5 MG tablet  Commonly known as:  BIDIL  Take 2 tablets by mouth 3 (three) times daily.     losartan 25 MG tablet  Commonly known as:  COZAAR  Take 1 tablet (25 mg total) by mouth daily.     phenytoin 100 MG ER capsule  Commonly known as:  DILANTIN  Take 1 capsule (100 mg total) by mouth 3 (three) times daily.     warfarin 5 MG tablet  Commonly known as:  COUMADIN  TAKE AS DIRECTED BY  COUMADIN  CLINIC        Disposition   The patient will be discharged in stable condition to [facility]   Discharge Instructions    ACE Inhibitor / ARB already ordered    Complete by:  As directed      Diet - low sodium heart healthy    Complete by:  As directed      Heart Failure patients record your daily weight using the same scale at the same time of day    Complete by:  As directed      Increase activity slowly    Complete by:  As directed           Follow-up Information    Follow up with Elizabeth Palau, MD.   Specialty:  Family Medicine   Why:  MD @ SNF WITH FOLLOW   Contact information:   [place] [address] [phone]       Follow up with [facility].   Specialty:  [place]   Why:  They will do  your home health care at your home   Contact information:   [place] [address] [phone]       Follow up with Glori Bickers, MD On 10/31/2015.   Specialty:  Cardiology   Why:  at 300 pm for post [place] follow up. Please bring all of your medications to your visit. Pt code for parking is [id number]  Contact information:   [place] [address] [phone]       Follow up with [facility].   Specialty:  Skilled Nursing Facility   Contact information:   [address] [place] [phone]      Follow up with [place] On 10/25/2015.   Why:  at 1245 pm for COUMADIN CLINIC   Contact information:   [place] [address] [phone]        Duration of Discharge Encounter: Greater than 35 minutes   Signed, Annamaria Helling 10/20/2015, 10:09 AM  Patient seen and examined with Oda Kilts, PA-C. We discussed all aspects of the encounter. I agree with the assessment and plan as stated above.   Much improved. Can go to SNF today. Agree with meds as above. Will need close f.u in HF Clinic.   Jaquia Benedicto,MD 10:28 AM

## 2015-10-19 NOTE — Progress Notes (Signed)
Bed offer in place and accepted by patient and friend Mardene Celeste at [facility]. Tentative d/c for today delayed but hopefully will be stable for d/c tomorrow.  Fl2 in epic for MD's signature.  CSW to arrange d/c once stable.  Lorie Phenix. Pauline Good, [place]

## 2015-10-19 NOTE — Clinical Social Work Placement (Signed)
   CLINICAL SOCIAL WORK PLACEMENT  NOTE  Date:  10/19/2015  Patient Details  Name: [facility]. MRN: [id number] Date of Birth: Oct 19, 1946  Clinical Social Work is seeking post-discharge placement for this patient at the [place] level of care (*CSW will initial, date and re-position this form in  chart as items are completed):  Yes   Patient/family provided with [place] Work Department's list of facilities offering this level of care within the geographic area requested by the patient (or if unable, by the patient's family).  Yes   Patient/family informed of their freedom to choose among providers that offer the needed level of care, that participate in Medicare, Medicaid or managed care program needed by the patient, have an available bed and are willing to accept the patient.  Yes   Patient/family informed of [place]'s ownership interest in [facility] and [facility], as well as of the fact that they are under no obligation to receive care at these facilities.  PASRR submitted to EDS on 10/18/15     PASRR number received on 10/18/15     Existing PASRR number confirmed on       FL2 transmitted to all facilities in geographic area requested by pt/family on 10/18/15     FL2 transmitted to all facilities within larger geographic area on  [facility])     Patient informed that his/her managed care company has contracts with or will negotiate with certain facilities, including the following:            Patient/family informed of bed offers received.  Patient chooses bed at       Physician recommends and patient chooses bed at      Patient to be transferred to   on  .  Patient to be transferred to facility by Ambulance [facility])     Patient family notified on   of transfer.  Name of family member notified:        PHYSICIAN Please prepare priority discharge summary, including medications, Please sign FL2, Please prepare  prescriptions     Additional Comment:    _______________________________________________ Williemae Area, LCSW 10/18/2015  5:30 PM

## 2015-10-19 NOTE — Clinical Social Work Placement (Signed)
   CLINICAL SOCIAL WORK PLACEMENT  NOTE  Date:  10/19/2015  Patient Details  Name: [facility]. MRN: [id number] Date of Birth: April 11, 1946  Clinical Social Work is seeking post-discharge placement for this patient at the [place] level of care (*CSW will initial, date and re-position this form in  chart as items are completed):  Yes   Patient/family provided with [place] Work Department's list of facilities offering this level of care within the geographic area requested by the patient (or if unable, by the patient's family).  Yes   Patient/family informed of their freedom to choose among providers that offer the needed level of care, that participate in Medicare, Medicaid or managed care program needed by the patient, have an available bed and are willing to accept the patient.  Yes   Patient/family informed of [place]'s ownership interest in [facility] and [facility], as well as of the fact that they are under no obligation to receive care at these facilities.  PASRR submitted to EDS on 10/18/15     PASRR number received on 10/18/15     Existing PASRR number confirmed on       FL2 transmitted to all facilities in geographic area requested by pt/family on 10/18/15     FL2 transmitted to all facilities within larger geographic area on  [facility])     Patient informed that his/her managed care company has contracts with or will negotiate with certain facilities, including the following:   [facility])     Yes   Patient/family informed of bed offers received.  Patient chooses bed at [facility]     Physician recommends and patient chooses bed at      Patient to be transferred to [facility] on  .  Patient to be transferred to facility by Ambulance [facility])     Patient family notified on   of transfer.  Name of family member notified:        PHYSICIAN Please prepare priority discharge  summary, including medications, Please sign FL2, Please prepare prescriptions     Additional Comment:    _______________________________________________ Williemae Area, LCSW 10/19/2015, 4:25 PM

## 2015-10-19 NOTE — Progress Notes (Signed)
CARDIAC REHAB PHASE I   PRE:  Rate/Rhythm: 89 SR    BP: sitting 138/88    SaO2: 100 RA  MODE:  Ambulation: 280 ft   POST:  Rate/Rhythm: 122 ST with many PACs/PVCs (couplets at times)    BP: sitting 141/70     SaO2: 100 RA  Pt willing to walk and did fairly well. Increased distance, denied major pain or SOB. HR elevated with increased ectopy. Return to recliner. Attempted to do more education. Pt mostly nods, struggles to answer specific questions. Reviewed fluid guideline and discussed reading labels. He sts he lives with his pastors son who "has the same problem I have". This roommate does the cooking. Pt sts he cooks healthy. Encouraged more walking, esp at SNF. [phone]   Josephina Shih Lyle CES, ACSM 10/19/2015 11:04 AM

## 2015-10-19 NOTE — Progress Notes (Signed)
ANTICOAGULATION CONSULT NOTE - Follow Up Consult  Pharmacy Consult for Coumadin Indication: atrial fibrillation  No Known Allergies  Patient Measurements: Height: 5\' 11"  (180.3 cm) Weight: 229 lb 8 oz (104.1 kg) IBW/kg (Calculated) : 75.3  Vital Signs: Temp: 97.7 F (36.5 C) (10/26 1146) Temp Source: Oral (10/26 1146) BP: 104/76 mmHg (10/26 1146) Pulse Rate: 91 (10/26 1146)  Labs:  Recent Labs  10/17/15 0500 10/18/15 0538 10/19/15 0412  LABPROT 26.0* 25.5* 24.6*  INR 2.42* 2.36* 2.24*  CREATININE 1.28* 1.34* 1.33*    Estimated Creatinine Clearance: 67.1 mL/min (by C-G formula based on Cr of 1.33).  Assessment: 39 yom with afib is currently on therapeutic coumadin.  INR today is 2.24. No bleeding reported. He does continue on phenytoin (as pta) and amiodarone (new) so will watch closely for drug interactions with INR.  PTA dose: 5mg  daily  Goal of Therapy:  INR 2-3 Monitor platelets by anticoagulation protocol: Yes   Plan:  1) Coumadin 5mg  x 1 tonight 2) Daily INR  Nena Jordan PharmD., BCPS 10/19/2015 11:49 AM

## 2015-10-20 DIAGNOSIS — I2510 Atherosclerotic heart disease of native coronary artery without angina pectoris: Secondary | ICD-10-CM | POA: Diagnosis not present

## 2015-10-20 DIAGNOSIS — I509 Heart failure, unspecified: Secondary | ICD-10-CM | POA: Diagnosis not present

## 2015-10-20 DIAGNOSIS — G4089 Other seizures: Secondary | ICD-10-CM | POA: Diagnosis not present

## 2015-10-20 DIAGNOSIS — I5020 Unspecified systolic (congestive) heart failure: Secondary | ICD-10-CM | POA: Diagnosis not present

## 2015-10-20 DIAGNOSIS — M6281 Muscle weakness (generalized): Secondary | ICD-10-CM | POA: Diagnosis not present

## 2015-10-20 DIAGNOSIS — N183 Chronic kidney disease, stage 3 (moderate): Secondary | ICD-10-CM | POA: Diagnosis not present

## 2015-10-20 DIAGNOSIS — M1A9XX0 Chronic gout, unspecified, without tophus (tophi): Secondary | ICD-10-CM | POA: Diagnosis not present

## 2015-10-20 DIAGNOSIS — R262 Difficulty in walking, not elsewhere classified: Secondary | ICD-10-CM | POA: Diagnosis not present

## 2015-10-20 DIAGNOSIS — M109 Gout, unspecified: Secondary | ICD-10-CM | POA: Diagnosis not present

## 2015-10-20 DIAGNOSIS — Z951 Presence of aortocoronary bypass graft: Secondary | ICD-10-CM | POA: Diagnosis not present

## 2015-10-20 DIAGNOSIS — I130 Hypertensive heart and chronic kidney disease with heart failure and stage 1 through stage 4 chronic kidney disease, or unspecified chronic kidney disease: Secondary | ICD-10-CM | POA: Diagnosis not present

## 2015-10-20 DIAGNOSIS — E785 Hyperlipidemia, unspecified: Secondary | ICD-10-CM | POA: Diagnosis not present

## 2015-10-20 DIAGNOSIS — I5023 Acute on chronic systolic (congestive) heart failure: Secondary | ICD-10-CM | POA: Diagnosis not present

## 2015-10-20 DIAGNOSIS — I639 Cerebral infarction, unspecified: Secondary | ICD-10-CM | POA: Diagnosis not present

## 2015-10-20 DIAGNOSIS — I10 Essential (primary) hypertension: Secondary | ICD-10-CM | POA: Diagnosis not present

## 2015-10-20 DIAGNOSIS — M1000 Idiopathic gout, unspecified site: Secondary | ICD-10-CM | POA: Diagnosis not present

## 2015-10-20 DIAGNOSIS — I480 Paroxysmal atrial fibrillation: Secondary | ICD-10-CM | POA: Diagnosis not present

## 2015-10-20 DIAGNOSIS — H00019 Hordeolum externum unspecified eye, unspecified eyelid: Secondary | ICD-10-CM | POA: Diagnosis not present

## 2015-10-20 DIAGNOSIS — I5022 Chronic systolic (congestive) heart failure: Secondary | ICD-10-CM | POA: Diagnosis not present

## 2015-10-20 LAB — BASIC METABOLIC PANEL
ANION GAP: 9 (ref 5–15)
BUN: 36 mg/dL — ABNORMAL HIGH (ref 6–20)
CHLORIDE: 97 mmol/L — AB (ref 101–111)
CO2: 27 mmol/L (ref 22–32)
Calcium: 9.2 mg/dL (ref 8.9–10.3)
Creatinine, Ser: 1.3 mg/dL — ABNORMAL HIGH (ref 0.61–1.24)
GFR calc non Af Amer: 56 mL/min — ABNORMAL LOW (ref >60)
Glucose, Bld: 76 mg/dL (ref 65–99)
POTASSIUM: 4.2 mmol/L (ref 3.5–5.1)
SODIUM: 133 mmol/L — AB (ref 135–145)

## 2015-10-20 LAB — CARBOXYHEMOGLOBIN
Carboxyhemoglobin: 1.4 % (ref 0.5–1.5)
Methemoglobin: 0.8 % (ref 0.0–1.5)
O2 Saturation: 62.5 %
Total hemoglobin: 13.5 g/dL (ref 13.5–18.0)

## 2015-10-20 LAB — PROTIME-INR
INR: 2.56 — ABNORMAL HIGH (ref 0.00–1.49)
PROTHROMBIN TIME: 27.2 s — AB (ref 11.6–15.2)

## 2015-10-20 MED ORDER — MAGNESIUM CITRATE PO SOLN
0.5000 | Freq: Once | ORAL | Status: AC
  Administered 2015-10-20: 0.5 via ORAL
  Filled 2015-10-20: qty 296

## 2015-10-20 MED ORDER — LOSARTAN POTASSIUM 25 MG PO TABS: 25.0000 mg | ORAL_TABLET | Freq: Every day | ORAL | Status: DC

## 2015-10-20 MED ORDER — ISOSORB DINITRATE-HYDRALAZINE 20-37.5 MG PO TABS: 2.0000 | ORAL_TABLET | Freq: Three times a day (TID) | ORAL | Status: DC

## 2015-10-20 MED ORDER — FUROSEMIDE 40 MG PO TABS: 80.0000 mg | ORAL_TABLET | Freq: Every day | ORAL | Status: DC

## 2015-10-20 MED ORDER — WARFARIN SODIUM 5 MG PO TABS: 5.0000 mg | ORAL_TABLET | Freq: Every day | ORAL | Status: DC

## 2015-10-20 MED ORDER — AMIODARONE HCL 200 MG PO TABS: 200.0000 mg | ORAL_TABLET | Freq: Every day | ORAL | Status: DC

## 2015-10-20 NOTE — NC FL2 (Deleted)
[place] MEDICAID FL2 LEVEL OF CARE SCREENING TOOL     IDENTIFICATION  Patient Name: Louis Ford. Birthdate: [birth year] Sex: male Admission Date (Current Location): 10/04/2015  [facility] and [place] Number: Herbalist and Address:  [facility], [place] [address], [place], [place] [address]      Provider Number: [id number]  Attending Physician Name and Address:  Burnell Blanks, *  Relative Name and Phone Number:       Current Level of Care: Hospital Recommended Level of Care: [place] Prior Approval Number:    Date Approved/Denied:   PASRR Number:    Discharge Plan: SNF    Current Diagnoses: Patient Active Problem List   Diagnosis Date Noted  . Acute on chronic systolic (congestive) heart failure ([place])   . CHF (congestive heart failure), NYHA class III ([place]) 10/04/2015  . Loss of consciousness 01/05/2015  . Afib ([place]) 04/21/2014  . Encounter for therapeutic drug monitoring 01/19/2014  . Atrial fibrillation ([place]) 01/01/2014  . Long term current use of anticoagulant 01/01/2014  . Chronic systolic heart failure ([place]) 11/06/2012  . 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 Coagulopathy ([place]) 10/03/2012  . Seizure ([place]) 10/01/2012  . Renal insufficiency 10/01/2012  . Ischemic cardiomyopathy  ? additional rate component 09/29/2012  . Automatic implantable cardioverter-defibrillator in situ   . Epistaxis 09/27/2012  . Odynophagia 09/26/2012  . Acute on chronic systolic heart failure ([place]) 09/25/2012  . Atrial fibrillation   09/22/2012  . Dyspnea 09/21/2012  . HTN (hypertension) 09/21/2012  . Accelerated hypertension 09/21/2012  . CAD (coronary artery disease) s/p cabg 09/21/2012    Orientation ACTIVITIES/SOCIAL BLADDER RESPIRATION       Active (Friend: [name]) Continent Normal  BEHAVIORAL SYMPTOMS/MOOD NEUROLOGICAL BOWEL NUTRITION STATUS      Continent Diet (Heart Healthy)  PHYSICIAN  VISITS COMMUNICATION OF NEEDS Height & Weight Skin  30 days Verbally 5\' 11"  (180.3 cm) 229 lbs. Other (Comment) (PEG site -abdomen)          AMBULATORY STATUS RESPIRATION    Assist extensive Normal      Personal Care Assistance Level of Assistance  Bathing, Dressing Bathing Assistance: Limited assistance Feeding assistance: Limited assistance Dressing Assistance: Limited assistance      Functional Limitations Info                [place]  PT (By licensed PT), Speech therapy (ST)     PT Frequency: 5       Speech Therapy Frequency: 3     Additional Factors Info  Allergies, Code Status Code Status Info: Full Allergies Info: NKA           Current Medications (10/20/2015): Current Facility-Administered Medications  Medication Dose Route Frequency Provider Last Rate Last Dose  . 0.9 %  sodium chloride infusion  250 mL Intravenous PRN [name], MD      . 0.9 %  sodium chloride infusion  250 mL Intravenous PRN [name], MD 5 mL/hr at 10/12/15 1155 250 mL at 10/12/15 1155  . acetaminophen (TYLENOL) tablet 650 mg  650 mg Oral Q4H PRN Burnell Blanks, MD   650 mg at 10/10/15 0906  . amiodarone (PACERONE) tablet 200 mg  200 mg Oral BID [name], MD   200 mg at 10/19/15 2136  . furosemide (LASIX) tablet 80 mg  80 mg Oral Daily [name], MD   80 mg at 10/19/15  1013  . isosorbide-hydrALAZINE (BIDIL) 20-37.5 MG per tablet 2 tablet  2 tablet Oral TID [name], MD   2 tablet at 10/19/15 2136  . losartan (COZAAR) tablet 25 mg  25 mg Oral Daily [name], MD   25 mg at 10/19/15 1304  . magnesium citrate solution 0.5 Bottle  0.5 Bottle Oral Once Satira Mccallum Tillery, PA-C      . magnesium hydroxide (MILK OF MAGNESIA) suspension 30 mL  30 mL Oral Daily PRN Burnell Blanks, MD      . ondansetron [facility]) injection 4 mg  4 mg Intravenous Q6H PRN Burnell Blanks, MD      .  ondansetron [facility]) injection 4 mg  4 mg Intravenous Q6H PRN [name], MD      . phenytoin (DILANTIN) ER capsule 100 mg  100 mg Oral TID Burnell Blanks, MD   100 mg at 10/19/15 2136  . sodium chloride 0.9 % injection 10-40 mL  10-40 mL Intracatheter PRN Burnell Blanks, MD   10 mL at 10/19/15 0413  . sodium chloride 0.9 % injection 3 mL  3 mL Intravenous Q12H [name], MD   3 mL at 10/17/15 1000  . sodium chloride 0.9 % injection 3 mL  3 mL Intravenous PRN [name], MD      . sodium chloride 0.9 % injection 3 mL  3 mL Intravenous Q12H [name], MD   3 mL at 10/17/15 1000  . sodium chloride 0.9 % injection 3 mL  3 mL Intravenous PRN [name], MD      . sorbitol 70 % solution 30 mL  30 mL Oral Daily PRN [name], MD   30 mL at 10/19/15 1329  . traMADol (ULTRAM) tablet 50 mg  50 mg Oral Q6H PRN [name], MD   50 mg at 10/19/15 2136  . Warfarin - Pharmacist Dosing Inpatient   Does not apply [place]       Do not use this list as official medication orders. Please verify with discharge summary.  Discharge Medications:   Medication List    ASK your doctor about these medications        acetaminophen 325 MG tablet  Commonly known as:  TYLENOL  Take 650 mg by mouth every 6 (six) hours as needed for mild pain.     carvedilol 25 MG tablet  Commonly known as:  COREG  Take 1 tablet (25 mg total) by mouth 2 (two) times daily.     furosemide 40 MG tablet  Commonly known as:  LASIX  Take 2 tablets (80 mg total) by mouth daily.     lisinopril 5 MG tablet  Commonly known as:  PRINIVIL,ZESTRIL  Take 1 tablet (5 mg total) by mouth daily.     phenytoin 100 MG ER capsule  Commonly known as:  DILANTIN  Take 1 capsule (100 mg total) by mouth 3 (three) times daily.     warfarin 5 MG tablet  Commonly known as:  COUMADIN  TAKE AS DIRECTED BY  COUMADIN  CLINIC        Relevant Imaging  Results:  Relevant Lab Results:  Recent Labs    Additional Information    Cranford Mon, LCSW

## 2015-10-20 NOTE — Care Management Note (Addendum)
Case Management Note  Patient Details  Name: [facility]. MRN: [id number] Date of Birth: 06-12-1946  Subjective/Objective:                    Action/Plan: pt will be discharged to [facility] today. No further Discharge Needs.    Expected Discharge Date:  10/08/15               Expected Discharge Plan:  Skilled Nursing Facility  In-House Referral:  Clinical Social Work  Discharge planning Services  CM Consult  Post Acute Care Choice:    Choice offered to:  Patient  DME Arranged:    DME Agency:     HH Arranged:    [place] Agency:     Status of Service:  Completed, signed off  Medicare Important Message Given:  Yes-fourth notification given Date Medicare IM Given:    Medicare IM give by:    Date Additional Medicare IM Given:    Additional Medicare Important Message give by:     If discussed at [place] of Stay Meetings, dates discussed:    Additional Comments:  Delrae Sawyers, RN 10/20/2015, 10:15 AM

## 2015-10-20 NOTE — Progress Notes (Signed)
At 1434 pt d/c  To [facility] via ambulance.  Aunesti Pellegrino, Therapist, sports.

## 2015-10-20 NOTE — Progress Notes (Signed)
Patient will discharge to [facility] Anticipated discharge date:10/20/15 Family notified: pt pastor- Marcell Barlow Transportation by [facility]- scheduled for 1pm  CSW signing off.  Domenica Reamer, [place] Social Worker [phone]

## 2015-10-20 NOTE — Progress Notes (Signed)
Informed Andy ,PA with HF that order says d/c pt to home and he instructed that he reviewed order he  and instructed nurse that is also written to d/c pt to nursing home.  Karie Kirks, Therapist, sports.

## 2015-10-20 NOTE — NC FL2 (Signed)
[place] MEDICAID FL2 LEVEL OF CARE SCREENING TOOL     IDENTIFICATION  Patient Name: Louis Ford. Birthdate: [birth year] Sex: male Admission Date (Current Location): 10/04/2015  [facility] [place] [facility] and [place] Number: Herbalist and Address:  [facility], [place] [address] [place], [place] [address]      Provider Number: [id number]  Attending Physician Name and Address:  Burnell Blanks, *  Relative Name and Phone Number:       Current Level of Care: Hospital Recommended Level of Care: [place] Prior Approval Number:    Date Approved/Denied:   PASRR Number:    Discharge Plan: SNF    Current Diagnoses: Patient Active Problem List   Diagnosis Date Noted  . Acute on chronic systolic (congestive) heart failure ([place])   . CHF (congestive heart failure), NYHA class III ([place]) 10/04/2015  . Loss of consciousness 01/05/2015  . Afib ([place]) 04/21/2014  . Encounter for therapeutic drug monitoring 01/19/2014  . Atrial fibrillation ([place]) 01/01/2014  . Long term current use of anticoagulant 01/01/2014  . Chronic systolic heart failure ([place]) 11/06/2012  . 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 Coagulopathy ([place]) 10/03/2012  . Seizure ([place]) 10/01/2012  . Renal insufficiency 10/01/2012  . Ischemic cardiomyopathy  ? additional rate component 09/29/2012  . Automatic implantable cardioverter-defibrillator in situ   . Epistaxis 09/27/2012  . Odynophagia 09/26/2012  . Acute on chronic systolic heart failure ([place]) 09/25/2012  . Atrial fibrillation   09/22/2012  . Dyspnea 09/21/2012  . HTN (hypertension) 09/21/2012  . Accelerated hypertension 09/21/2012  . CAD (coronary artery disease) s/p cabg 09/21/2012    Orientation ACTIVITIES/SOCIAL BLADDER RESPIRATION       Active (Friend: [name]) Continent Normal  BEHAVIORAL SYMPTOMS/MOOD NEUROLOGICAL BOWEL NUTRITION STATUS      Continent Diet (Heart Healthy)  PHYSICIAN  VISITS COMMUNICATION OF NEEDS Height & Weight Skin  30 days Verbally 5\' 11"  (180.3 cm) 229 lbs. Other (Comment) (PEG site -abdomen)          AMBULATORY STATUS RESPIRATION    Assist extensive Normal      Personal Care Assistance Level of Assistance  Bathing, Dressing Bathing Assistance: Limited assistance Feeding assistance: Limited assistance Dressing Assistance: Limited assistance      Functional Limitations Info                [place]  PT (By licensed PT), Speech therapy (ST)     PT Frequency: 5       Speech Therapy Frequency: 3     Additional Factors Info  Allergies, Code Status Code Status Info: Full Allergies Info: NKA             Discharge Medications:   Medication List    STOP taking these medications        carvedilol 25 MG tablet  Commonly known as:  COREG     lisinopril 5 MG tablet  Commonly known as:  PRINIVIL,ZESTRIL      TAKE these medications        acetaminophen 325 MG tablet  Commonly known as:  TYLENOL  Take 650 mg by mouth every 6 (six) hours as needed for mild pain.     amiodarone 200 MG tablet  Commonly known as:  PACERONE  Take 1 tablet (200 mg total) by mouth daily.     furosemide 40 MG tablet  Commonly known as:  LASIX  Take 2 tablets (80 mg total) by mouth  daily.     isosorbide-hydrALAZINE 20-37.5 MG tablet  Commonly known as:  BIDIL  Take 2 tablets by mouth 3 (three) times daily.     losartan 25 MG tablet  Commonly known as:  COZAAR  Take 1 tablet (25 mg total) by mouth daily.     phenytoin 100 MG ER capsule  Commonly known as:  DILANTIN  Take 1 capsule (100 mg total) by mouth 3 (three) times daily.     warfarin 5 MG tablet  Commonly known as:  COUMADIN  Take 1 tablet (5 mg total) by mouth daily. Adjustments may be made by Coumadin Clinic.        Relevant Imaging Results:  Relevant Lab Results:  Recent Labs    Additional Information    Cranford Mon,  LCSW  Agreed.  Ardra Kuznicki,MD 10:23 AM

## 2015-10-20 NOTE — Progress Notes (Signed)
Physical Therapy Treatment Patient Details Name: Louis Ford. MRN: [id number] DOB: 1946/08/09 Today's Date: 10/20/2015    History of Present Illness 69 y.o. male with hx of CAD s/p CABG, ICM s/p Biotronik ICD, chronic systolic CHF Echo 65/78/46 LVEF 30-35%; normal RV; PA peak pressure 34 mm Hg, HTN, HLD, atrial fibrillation, previous stroke, Factor 7 deficiency, and seizure disorder. He presented to [facility] office on 10/04/15 with worsening SOB and Afib RVR and was admitted for HF management. Prior to this month could walk ~ 3 block round trip without difficulty. Since fall/seizure on 09/26/15 has had SOB walking to bathroom and back. No SOB at rest or PND. Chronic 3 pillow orthopnea.    PT Comments    PT session focused on ambulation and functional transfers.  Pt is progressing well but would benefit from further acute skilled PT to maximize safety and independence with functional mobility as well as increased activity tolerance.  Continue to recommend SNF upon discharge.   Follow Up Recommendations  SNF;Supervision/Assistance - 24 hour     Equipment Recommendations  Rolling walker with 5" wheels    Recommendations for Other Services       Precautions / Restrictions Restrictions Weight Bearing Restrictions: No    Mobility  Bed Mobility Overal bed mobility:  (received in recliner)                Transfers Overall transfer level: Modified independent Equipment used: None             General transfer comment: VCs to stand tall with pt unable to rise to full erect position.  Pt transfered with Mod Independence to toilet and able to perform standing peri-care with supervision.  Ambulation/Gait Ambulation/Gait assistance: Min guard Ambulation Distance (Feet): 175 Assistive device: Rolling walker (2 wheeled) Gait Pattern/deviations: Step-through pattern;Decreased stride length;Trunk flexed;Drifts right/left   Gait velocity interpretation: Below normal speed for  age/gender General Gait Details: VCs for upright posture and to stay inside the RW.  Mild instability at times, especially with turning.   Stairs            Wheelchair Mobility    Modified Rankin (Stroke Patients Only)       Balance Overall balance assessment: Needs assistance         Standing balance support: During functional activity Standing balance-Leahy Scale: Fair                      Cognition Arousal/Alertness: Awake/alert Behavior During Therapy: Restless Overall Cognitive Status: Within Functional Limits for tasks assessed                      Exercises      General Comments General comments (skin integrity, edema, etc.): Pt with limited safety awareness and attempting to furniture walk in the room.      Pertinent Vitals/Pain Pain Assessment: No/denies pain    Home Living                      Prior Function            PT Goals (current goals can now be found in the care plan section) Acute Rehab PT Goals Patient Stated Goal: move better PT Goal Formulation: With patient Time For Goal Achievement: 10/25/15 Potential to Achieve Goals: Good Progress towards PT goals: Progressing toward goals    Frequency  Min 3X/week    PT Plan Current plan remains appropriate  Co-evaluation             End of Session Equipment Utilized During Treatment: Gait belt Activity Tolerance: Patient tolerated treatment well Patient left: in chair;with call bell/phone within reach;with chair alarm set;with nursing/sitter in room     Time: 7680-8811 PT Time Calculation (min) (ACUTE ONLY): 17 min  Charges:   $Gait Training: 8-22 mins                    G Codes:      Gerre Ranum 10/30/2015, 2:34 PM Lorita Officer, SPT

## 2015-10-20 NOTE — Progress Notes (Signed)
Advanced Heart Failure Rounding Note  PCP: Dr Elizabeth Palau Primary Cardiologist: Dr Lovena Le  Subjective:    Milrinone stopped 10/18/15.  Amio started for A fib 10/16/15  Hand still hurting but movement improved.  Wants to know how long he will be in the facility and to make sure his Doristine Bosworth will be there to help fill out paperwork.  Abd pain improved, but still no BM.  CO-OX 62.5% off milrinone. K 4.2 Creatinine 1.30 Na 133. INR 2.56  Objective:   Weight Range: 225 lb 10.2 oz (102.35 kg) Body mass index is 31.48 kg/(m^2).   Vital Signs:   Temp:  [97.5 F (36.4 C)-98.6 F (37 C)] 97.9 F (36.6 C) (10/27 0509) Pulse Rate:  [70-91] 88 (10/27 0509) Resp:  [18-20] 20 (10/27 0509) BP: (104-146)/(76-104) 129/91 mmHg (10/27 0509) SpO2:  [99 %-100 %] 100 % (10/27 0509) Weight:  [225 lb 10.2 oz (102.35 kg)] 225 lb 10.2 oz (102.35 kg) (10/27 0509) Last BM Date: 10/09/15  Weight change: Filed Weights   10/18/15 0415 10/19/15 0633 10/20/15 0509  Weight: 230 lb 9.6 oz (104.599 kg) 229 lb 8 oz (104.1 kg) 225 lb 10.2 oz (102.35 kg)    Intake/Output:   Intake/Output Summary (Last 24 hours) at 10/20/15 0851 Last data filed at 10/20/15 5631  Gross per 24 hour  Intake    822 ml  Output   2750 ml  Net  -1928 ml     Physical Exam: General: In bed, NAD HEENT: Slightly tender R neck, unimpressive on exam. Neck: supple. JVP not elevated. Carotids 2+ bilat; no bruits. No thyromegaly or nodule noted Cor: PMI nondisplaced. Irregularly irregular. No rubs, gallops, or murmurs. Improved ecchymosis around L chest will small nodule. ICD R chest. Lungs: Clear, normal effort Abdomen: soft, obese, non distended, mildly tender. No guarding or rebound. No HSM. No bruits or masses. +BS Extremities: no cyanosis, clubbing, rash, or edema. L hand with swelling improved. PICC LUE.  Neuro: alert & orientedx3, cranial nerves grossly intact. moves all 4 extremities w/o difficulty. Affect  flat  Telemetry: WAP 80s   Labs: CBC No results for input(s): WBC, NEUTROABS, HGB, HCT, MCV, PLT in the last 72 hours. Basic Metabolic Panel  Recent Labs  10/19/15 0412 10/20/15 0516  NA 132* 133*  K 4.5 4.2  CL 96* 97*  CO2 22 27  GLUCOSE 111* 76  BUN 33* 36*  CREATININE 1.33* 1.30*  CALCIUM 9.0 9.2   Liver Function Tests No results for input(s): AST, ALT, ALKPHOS, BILITOT, PROT, ALBUMIN in the last 72 hours. No results for input(s): LIPASE, AMYLASE in the last 72 hours. Cardiac Enzymes No results for input(s): CKTOTAL, CKMB, CKMBINDEX, TROPONINI in the last 72 hours.  BNP: BNP (last 3 results)  Recent Labs  10/04/15 1604  BNP 796.9*    ProBNP (last 3 results) No results for input(s): PROBNP in the last 8760 hours.   D-Dimer No results for input(s): DDIMER in the last 72 hours. Hemoglobin A1C No results for input(s): HGBA1C in the last 72 hours. Fasting Lipid Panel No results for input(s): CHOL, HDL, LDLCALC, TRIG, CHOLHDL, LDLDIRECT in the last 72 hours. Thyroid Function Tests No results for input(s): TSH, T4TOTAL, T3FREE, THYROIDAB in the last 72 hours.  Invalid input(s): FREET3  Imaging/Studies:  Dg Abd 1 View  10/19/2015  CLINICAL DATA:  MID ABDOMEN DISCOMFORT WHEN COUGHING.NO BM FOR 4 DAYS EXAM: ABDOMEN - 1 VIEW COMPARISON:  None. FINDINGS: Bowel gas pattern is  nonobstructive. Moderate stool burden is noted. Degenerative changes state lower lumbar spine. No evidence for organomegaly. No abnormal calcifications. IMPRESSION: Moderate stool burden. No evidence for acute  abnormality. Electronically Signed   By: Nolon Nations M.D.   On: 10/19/2015 10:04     Medications:     Scheduled Medications: . amiodarone  200 mg Oral BID  . furosemide  80 mg Oral Daily  . isosorbide-hydrALAZINE  2 tablet Oral TID  . losartan  25 mg Oral Daily  . phenytoin  100 mg Oral TID  . sodium chloride  3 mL Intravenous Q12H  . sodium chloride  3 mL Intravenous Q12H   . Warfarin - Pharmacist Dosing Inpatient   Does not apply q1800    Infusions:    PRN Medications: sodium chloride, sodium chloride, acetaminophen, magnesium hydroxide, ondansetron (ZOFRAN) IV, ondansetron (ZOFRAN) IV, sodium chloride, sodium chloride, sodium chloride, sorbitol, traMADol   Assessment   1. Acute on Chronic Systolic CHF, echo 59/16/38 LVEF 30-35%,  2. Atrial Fibrillation 3. CAD 4. HTN 5. AKI on CKD stage III - Baseline 1.1 to 1.2 6. Seizures - on phenytoin 7. Abdominal Pain  8. Gout, acute  Plan   Stable for d/c to SNF.  On full dose bidil and po lasix.  Off milrinone.     Final day of Prednisone 40 mg (10/25-10/27) for gout flare  Abdomen improved with sorbitol yesterday, but still no BM. Will discuss best option with pharm.  KUB showed moderate stool burden.  Now in NSR/MAT. On amio 200 mg twice daily, will d/c on 200 mg daily.  Continue coumadin.  No BB with recent need for inotropes.  Cardiac rehab following.  Length of Stay: [place] PA-C  10/20/2015, 8:51 AM  Advanced Heart Failure Team Pager [phone] (M-F; 7a - 4p)  Please contact [facility] for night-coverage after hours (4p -7a ) and weekends on [email or web address]  Patient seen and examined with Oda Kilts, PA-C. We discussed all aspects of the encounter. I agree with the assessment and plan as stated above.   Much improved. Can go to SNF today. Agree with meds as above. Will need close f.u in HF Clinic.   Anvi Mangal,MD 10:25 AM

## 2015-10-20 NOTE — Patient Outreach (Signed)
[facility]) Care Management  10/20/2015  [place] [facility] [place]. [birth year] [id number]   Referral from Natividad Brood, RN to assign SW to follow patient in [place] and Charity fundraiser, assigned [employer], LCSW and Raina Mina, Therapist, sports.  Thanks, Ronnell Freshwater. [place], [place] Assistant Phone: [phone] Fax: [phone]

## 2015-10-25 ENCOUNTER — Ambulatory Visit: Payer: Medicare Other | Admitting: *Deleted

## 2015-10-25 DIAGNOSIS — M1A9XX0 Chronic gout, unspecified, without tophus (tophi): Secondary | ICD-10-CM | POA: Diagnosis not present

## 2015-10-25 DIAGNOSIS — M1000 Idiopathic gout, unspecified site: Secondary | ICD-10-CM | POA: Diagnosis not present

## 2015-10-25 DIAGNOSIS — N183 Chronic kidney disease, stage 3 (moderate): Secondary | ICD-10-CM | POA: Diagnosis not present

## 2015-10-25 DIAGNOSIS — H00019 Hordeolum externum unspecified eye, unspecified eyelid: Secondary | ICD-10-CM | POA: Diagnosis not present

## 2015-10-26 ENCOUNTER — Other Ambulatory Visit: Payer: Self-pay | Admitting: *Deleted

## 2015-10-26 NOTE — Patient Outreach (Signed)
[facility]) Care Management  [facility] Social Work  10/26/2015  [place]. 02-10-1946 [id number]  Subjective:    Objective:   Current Medications:  Current Outpatient Prescriptions  Medication Sig Dispense Refill  . acetaminophen (TYLENOL) 325 MG tablet Take 650 mg by mouth every 6 (six) hours as needed for mild pain.    [name] amiodarone (PACERONE) 200 MG tablet Take 1 tablet (200 mg total) by mouth daily. 30 tablet 6  . furosemide (LASIX) 40 MG tablet Take 2 tablets (80 mg total) by mouth daily. 60 tablet 3  . isosorbide-hydrALAZINE (BIDIL) 20-37.5 MG tablet Take 2 tablets by mouth 3 (three) times daily. 180 tablet 6  . losartan (COZAAR) 25 MG tablet Take 1 tablet (25 mg total) by mouth daily. 30 tablet 6  . phenytoin (DILANTIN) 100 MG ER capsule Take 1 capsule (100 mg total) by mouth 3 (three) times daily. 90 capsule 0  . warfarin (COUMADIN) 5 MG tablet Take 1 tablet (5 mg total) by mouth daily. Adjustments may be made by Coumadin Clinic. 35 tablet 3   No current facility-administered medications for this visit.    Functional Status:  In your present state of health, do you have any difficulty performing the following activities: 10/26/2015 10/04/2015  Hearing? N N  Vision? N N  Difficulty concentrating or making decisions? N N  Walking or climbing stairs? Y Y  Dressing or bathing? N N  Doing errands, shopping? N N  Preparing Food and eating ? N -  Using the Toilet? N -  In the past six months, have you accidently leaked urine? N -  Do you have problems with loss of bowel control? N -  Managing your Medications? N -  Managing your Finances? Y -  Housekeeping or managing your Housekeeping? N -    Fall/Depression Screening:  PHQ 2/9 Scores 10/26/2015  PHQ - 2 Score 1    Assessment:  This Education officer, museum visited patient at [facility] facility.   Patient friendly, engaging.  Anxious to discharge home to avoid daily co-pay.  Patient  currently residing with his  pastor's son in an apartment.  Per patient, his  [name] [phone] serves as his caregiver and has agreed to assist with his overall affairs.    Patient uses [facility] for his transportation needs.  This social worker spoke with Marita [place], discharge planner who reports that patient is actively participating in treatment.  Tentative discharge date is set for  11/09/15 with home health  through [facility] or [facility] .  Discharge planner reports seeing no issues or barriers regarding his plan to discharge on 11/09/15 and commented that patient is doing well.  Patient gave this social worker verbal permission to speak to his Financial risk analyst.  Per patient's pastor, patient has poor diet habits, which is a major concern.  She appreciated [facility] involvement to assist patient in managing his condition.  Plan: This social worker will continue to follow patient during is nursing home stay to ensure a safe and stable discharge.     [facility] CM Care Plan Problem One        Most Recent Value   Care Plan Problem One  patient currentlly in skilled nursing facility   Role Documenting the Problem One  Clinical Social Worker   Care Plan for Problem One  Active   [facility] CM Short Term Goal #1 (0-30 days)  patient will actively participate in physical and occupational therapy  for the next two weeks   [facility] CM Short Term Goal #1 Start Date  10/26/15   Interventions for Short Term Goal #1  LCSW encouraged  patient to actively participate in treatment during skilled nursing stay   [facility] CM Short Term Goal #2 (0-30 days)  patient and LCSW will work with discharge planner regarding discharge plan to ensure a safe and stable discharg back home for the next 2 weeks   [facility] CM Short Term Goal #2 Start Date  10/26/15   Interventions for Short Term Goal #2  LCSW spoke to discharge planner regarding  tentative discharge plans     [name] [facility] Care Management [phone]

## 2015-10-31 ENCOUNTER — Ambulatory Visit (HOSPITAL_COMMUNITY)
Admit: 2015-10-31 | Discharge: 2015-10-31 | Disposition: A | Discharge status: Home / Self Care | Payer: Medicare Other | Source: Ambulatory Visit | Attending: Internal Medicine | Admitting: Internal Medicine

## 2015-10-31 ENCOUNTER — Encounter (HOSPITAL_COMMUNITY): Payer: Self-pay | Admitting: Internal Medicine

## 2015-10-31 VITALS — BP 106/68 | HR 69 | Wt 221.8 lb

## 2015-10-31 DIAGNOSIS — M109 Gout, unspecified: Secondary | ICD-10-CM | POA: Insufficient documentation

## 2015-10-31 DIAGNOSIS — I5022 Chronic systolic (congestive) heart failure: Secondary | ICD-10-CM | POA: Insufficient documentation

## 2015-10-31 DIAGNOSIS — I130 Hypertensive heart and chronic kidney disease with heart failure and stage 1 through stage 4 chronic kidney disease, or unspecified chronic kidney disease: Secondary | ICD-10-CM | POA: Diagnosis not present

## 2015-10-31 DIAGNOSIS — I5023 Acute on chronic systolic (congestive) heart failure: Secondary | ICD-10-CM

## 2015-10-31 DIAGNOSIS — I2510 Atherosclerotic heart disease of native coronary artery without angina pectoris: Secondary | ICD-10-CM | POA: Diagnosis not present

## 2015-10-31 DIAGNOSIS — N183 Chronic kidney disease, stage 3 (moderate): Secondary | ICD-10-CM | POA: Insufficient documentation

## 2015-10-31 DIAGNOSIS — Z951 Presence of aortocoronary bypass graft: Secondary | ICD-10-CM | POA: Insufficient documentation

## 2015-10-31 DIAGNOSIS — I480 Paroxysmal atrial fibrillation: Secondary | ICD-10-CM | POA: Insufficient documentation

## 2015-10-31 LAB — BASIC METABOLIC PANEL
ANION GAP: 10 (ref 5–15)
BUN: 36 mg/dL — AB (ref 6–20)
CHLORIDE: 98 mmol/L — AB (ref 101–111)
CO2: 24 mmol/L (ref 22–32)
Calcium: 9.1 mg/dL (ref 8.9–10.3)
Creatinine, Ser: 2.16 mg/dL — ABNORMAL HIGH (ref 0.61–1.24)
GFR calc Af Amer: 35 mL/min — ABNORMAL LOW (ref >60)
GFR, EST NON AFRICAN AMERICAN: 30 mL/min — AB (ref >60)
GLUCOSE: 115 mg/dL — AB (ref 65–99)
POTASSIUM: 4.3 mmol/L (ref 3.5–5.1)
Sodium: 132 mmol/L — ABNORMAL LOW (ref 135–145)

## 2015-10-31 LAB — BRAIN NATRIURETIC PEPTIDE: B NATRIURETIC PEPTIDE 5: 53.9 pg/mL (ref 0.0–100.0)

## 2015-10-31 LAB — PROTIME-INR
INR: 2.37 — ABNORMAL HIGH (ref 0.00–1.49)
PROTHROMBIN TIME: 25.6 s — AB (ref 11.6–15.2)

## 2015-10-31 MED ORDER — ALLOPURINOL 100 MG PO TABS
100.0000 mg | ORAL_TABLET | Freq: Every day | ORAL | Status: DC
Start: 2015-10-31 — End: 2015-10-31

## 2015-10-31 MED ORDER — ALLOPURINOL 100 MG PO TABS: 200.0000 mg | ORAL_TABLET | Freq: Every day | ORAL | Status: DC

## 2015-10-31 MED ORDER — SPIRONOLACTONE 25 MG PO TABS: 25.0000 mg | ORAL_TABLET | Freq: Every day | ORAL | Status: DC

## 2015-10-31 NOTE — Patient Instructions (Addendum)
START ALLOPURINOL 200 MG DAILY INCREASE SPIRONOLACTONE TO 25 MG DAILY  LABS TODAY  Your physician recommends that you schedule a follow-up appointment with the coumadin clinic ASAP  Your physician recommends that you schedule a follow-up appointment in: 1 month

## 2015-10-31 NOTE — Progress Notes (Signed)
Patient ID: Louis Ford., male   DOB: 09-15-1946, 69 y.o.   MRN: [id number]  Primary Care: Dr Lindwood Qua Primary Cardiologist: Dr Lovena Le Primary HF: Dr. Haroldine Laws  HPI: [name] is a 69 y.o. male with hx of CAD s/p CABG, ICM s/p Biotronik ICD, chronic systolic CHF Echo 84/13/24 LVEF 30-35%; normal RV; PA peak pressure 34 mm Hg, HTN, HLD, atrial fibrillation, previous stroke, Factor 7 deficiency, and seizure disorder who presented to [facility] office on 10/04/15 with worsening SOB and Afib RVR and was admitted for same with 40 lb weight gain over the past year.   He diuresed well initially on IV lasix 40 mg BID, but continued to have SOB and was thus on milrinoneon 10/14 for concerns of low output. [place] on 10/12/15 that showed markedly depressed cardiac output with Fick CO/CI 3.6/1.6. Improved on milrinone and remained stable with wean with CO-OX 60-70%. Bidil started and increase to goal dose.Also started on amio for afib rate control. D/c'd to [facility]. Overall out 7.6 L and down 15 lbs. D/c weight 229 lbs.  Echo 10/05/15 LVEF 30-35%, Mild MR, PA peak pressure 34 mmHg  RHC 10/12/15 RA = 7 RV = 28/1/8 PA = 32/15 (20) PCW = 12 Fick cardiac output/index = 3.6/1.6 Thermo cardiac output/index = 3.5/1.5 PVR = 2.2 WU PA sat = 44%, 47% Ao sat = 98%  He returns today for post hospital follow up. Feels much better. No SOB as long as he takes his time. Progressing with PT at facility and scheduled to leave 11/09/15. Did have some bilateral chest pain last night at rest, lying in bed after a big dinner. Not related to activity. Does still have some DOE if he gets into a hurry. Fine on stair machine. Chronic orthopnea with 2 pillows.  No PND. Has some mild lightheadedness with rapid standing on his seizure medications, but not limiting and no near syncope. Taking all medications as directed.  Watching fluid intake and no added salt. Meals provided by facility. Peeing a lot after  taking lasix. Denies any bleeding on warfarin.   Past Medical History  Diagnosis Date  . CAD (coronary artery disease)   . Chronic systolic heart failure ([place])   . Ischemic cardiomyopathy     EF 15 to 20% by TTE and TEE in 09/2012.  Severe LV dysfunction  . HTN (hypertension)   . Hyperlipidemia   . Obesity     BMI 31 in 09/2012  . Atrial fibrillation   09/22/2012  . implantable cardiac defibrillator-Biotronik     Device Implanted 2006; s/p gen change 03/2011 : bleeding persistent with pocket erosion and infection; explant and reimplant  06/2011  . Stroke ([place])   . 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 History of MRSA infection 05/2011  . Seizure disorder ([place]) latest 09/30/2012  . Factor VII deficiency ([place]) 10/07/2012  . Seizures ([place])     Current Outpatient Prescriptions  Medication Sig Dispense Refill  . acetaminophen (TYLENOL) 325 MG tablet Take 650 mg by mouth every 6 (six) hours as needed for mild pain.    [name] amiodarone (PACERONE) 200 MG tablet Take 1 tablet (200 mg total) by mouth daily. 30 tablet 6  . furosemide (LASIX) 40 MG tablet Take 2 tablets (80 mg total) by mouth daily. 60 tablet 3  . isosorbide-hydrALAZINE (BIDIL) 20-37.5 MG tablet Take 2 tablets by mouth 3 (three) times daily. 180 tablet 6  . losartan (COZAAR) 25 MG tablet Take 1 tablet (  25 mg total) by mouth daily. 30 tablet 6  . phenytoin (DILANTIN) 100 MG ER capsule Take 1 capsule (100 mg total) by mouth 3 (three) times daily. 90 capsule 0  . spironolactone (ALDACTONE) 25 MG tablet Take 25 mg by mouth 2 (two) times a week. Take on Tuesday and Friday    . warfarin (COUMADIN) 4 MG tablet Take 4 mg by mouth daily.     No current facility-administered medications for this encounter.    No Known Allergies    Social History   Social History  . Marital Status: Single    Spouse Name: N/A  . Number of Children: N/A  . Years of Education: N/A   Occupational History  . Not on file.   Social History Main  Topics  . Smoking status: Former Smoker    Types: Pipe    Quit date: 09/21/2008  . Smokeless tobacco: Former Systems developer    Quit date: 09/21/2008  . Alcohol Use: No  . Drug Use: No  . Sexual Activity: No   Other Topics Concern  . Not on file   Social History Narrative   ** Merged History Encounter **          Family History  Problem Relation Age of Onset  . Arthritis Mother   . Heart disease Mother   . Other Father     smoker  . Heart attack Mother   . Hypertension Neg Hx     unknown  . Stroke Neg Hx     unknown    Filed Vitals:   10/31/15 1443  BP: 106/68  Pulse: 69  Weight: 221 lb 12 oz (100.585 kg)  SpO2: 97%    PHYSICAL EXAM: General:  Well appearing. No respiratory difficulty HEENT: normal Neck: supple. no JVD. Carotids 2+ bilat; no bruits. No lymphadenopathy or thyromegaly appreciated. Cor: PMI nondisplaced. Irregularly irregular. No M/G/R. ICD R check. Lungs: CTA, Abdomen: soft, nontender, nondistended. No hepatosplenomegaly. No bruits or masses. _BS Extremities: no cyanosis, clubbing, rash. Trace LE edema  Neuro: alert & oriented x 3, cranial nerves grossly intact. moves all 4 extremities w/o difficulty. Affect pleasant.  ASSESSMENT & PLAN:  1. Chronic Systolic CHF, echo 89/38/10 LVEF 30-35% - Volume status stable. - Continue current regimen - Arlyce Harman should be 25 mg daily - Consider adding coreg next visit. 2. Paroxysmal Atrial Fibrillation  - In sinus on amiodarone 200 mg daily. - On coumadin, missed Coumadin Clinic appointment 10/25/15. Needs follow up.  3. CAD s/p CABG 4. HTN - No room for uptitration. 5. CKD stage III -BMET today 6. Gout - Start allopurinol 200 mg daily  BMET, BNP, and INR today.  Follow up in 4 weeks. Needs coumadin clinic.   [name] [address]" [place], PA-C 10/31/2015 3:01 PM   Patient seen and examined with Oda Kilts, PA-C. We discussed all aspects of the encounter. I agree with the assessment and plan as stated above.    Overall improved. NYHA III. Volume status looks very good. Will adjust spiro dose. No b-blocker yet. Will start allopurinol for gout. Maintaining sinus on amio. Refer back to Coumadin Clinic. Continue close f/u.   Nashya Garlington,MD 10:19 AM

## 2015-10-31 NOTE — Progress Notes (Signed)
Advanced Heart Failure Medication Review by a Pharmacist  Does the patient  feel that his/her medications are working for him/her?  yes  Has the patient been experiencing any side effects to the medications prescribed?  no  Does the patient measure his/her own blood pressure or blood glucose at home?  no   Does the patient have any problems obtaining medications due to transportation or finances?   no  Understanding of regimen: good Understanding of indications: good Potential of compliance: good Patient understands to avoid NSAIDs. Patient understands to avoid decongestants.  Issues to address at subsequent visits: None   Pharmacist comments:  Louis Ford is a pleasant 69 yo M presenting with a current MAR from [place]. All medications were reconciled from his list. He did not have any specific medication-related questions or concerns for me at this time.   Ruta Hinds. Velva Harman, PharmD, BCPS, CPP Clinical Pharmacist Pager: [phone] Phone: [phone] 10/31/2015 2:56 PM      Time with patient: 6 minutes Preparation and documentation time: 2 mintues Total time: 8 minutes

## 2015-11-01 ENCOUNTER — Ambulatory Visit: Payer: Medicare Other | Admitting: *Deleted

## 2015-11-04 ENCOUNTER — Telehealth: Payer: Self-pay | Admitting: Internal Medicine

## 2015-11-04 ENCOUNTER — Telehealth (HOSPITAL_COMMUNITY): Payer: Self-pay | Admitting: *Deleted

## 2015-11-04 DIAGNOSIS — I5022 Chronic systolic (congestive) heart failure: Secondary | ICD-10-CM

## 2015-11-04 NOTE — Telephone Encounter (Signed)
GTA paperwork patient has never picked up from 04/20/14, placed in mail to patients home address/KM

## 2015-11-04 NOTE — Telephone Encounter (Signed)
Notes Recorded by Scarlette Calico, RN on 11/04/2015 at 2:00 PM Pt aware and agreeable, repeat labs 11/14 at [facility]

## 2015-11-04 NOTE — Telephone Encounter (Signed)
-----   Message from [name], MD sent at 11/03/2015  4:07 PM EST ----- Creatinine up some. Repeat BMET next week.

## 2015-11-07 ENCOUNTER — Ambulatory Visit (INDEPENDENT_AMBULATORY_CARE_PROVIDER_SITE_OTHER): Payer: Medicare Other | Admitting: Pharmacist

## 2015-11-07 ENCOUNTER — Other Ambulatory Visit (INDEPENDENT_AMBULATORY_CARE_PROVIDER_SITE_OTHER): Payer: Medicare Other | Admitting: *Deleted

## 2015-11-07 ENCOUNTER — Telehealth (HOSPITAL_COMMUNITY): Payer: Self-pay | Admitting: *Deleted

## 2015-11-07 DIAGNOSIS — I10 Essential (primary) hypertension: Secondary | ICD-10-CM

## 2015-11-07 DIAGNOSIS — Z5181 Encounter for therapeutic drug level monitoring: Secondary | ICD-10-CM

## 2015-11-07 DIAGNOSIS — I480 Paroxysmal atrial fibrillation: Secondary | ICD-10-CM

## 2015-11-07 LAB — POCT INR: INR: 3.8

## 2015-11-07 NOTE — Telephone Encounter (Signed)
Received mess from [place], [place] in [facility], she states when pt was at [facility] today he told her he can not afford the Bidil, it cost $280.  Will have Doroteo Bradford, PharmD contact pt to f/u

## 2015-11-08 ENCOUNTER — Other Ambulatory Visit: Payer: Self-pay | Admitting: *Deleted

## 2015-11-08 NOTE — Patient Outreach (Signed)
[place] [facility]) Care Management  11/08/2015  Louis Ford. March 27, 1946 [id number]   Phone call to [facility] -spoke with the discharge planner Sholange who confirmed that patient discharged home on 11/04/15.  Per [facility], patient had a previous discharge date on 11/09/15, however due to progress made patient was discharged on 11/04/15.  Patient discharged with [facility].  This Education officer, museum also spoke with patient's [name] who also confirmed patient's discharge.  She was concerned about patient being discharged on a medication that patient could not afford to be refilled.  She was not sure of the name of the medication.   RNCM [name] informed of patient's discharge from [facility] and possible issue regarding medication getting refilled.  [name] [facility] Care Management [phone]

## 2015-11-09 ENCOUNTER — Other Ambulatory Visit: Payer: Self-pay | Admitting: *Deleted

## 2015-11-09 ENCOUNTER — Telehealth (HOSPITAL_COMMUNITY): Payer: Self-pay

## 2015-11-09 MED ORDER — ACETAMINOPHEN 325 MG PO TABS: 650.0000 mg | ORAL_TABLET | Freq: Four times a day (QID) | ORAL | Status: DC | PRN

## 2015-11-09 MED ORDER — ISOSORB DINITRATE-HYDRALAZINE 20-37.5 MG PO TABS: 2.0000 | ORAL_TABLET | Freq: Three times a day (TID) | ORAL | Status: DC

## 2015-11-09 NOTE — Telephone Encounter (Signed)
Opened in error

## 2015-11-09 NOTE — Telephone Encounter (Signed)
Acetiminophen and Isorbide sent in to pharmacy.  Called patient and let him know. Reviewing chart saw this message: Received mess from [name], [place] in [facility], she states when pt was at [facility] today he told her he can not afford the Bidil, it cost $280. Will have Doroteo Bradford, PharmD contact pt to f/u   Will reroute to [facility]

## 2015-11-09 NOTE — Patient Outreach (Addendum)
[place] [facility]) Care Management  11/09/2015  Gra[place]en Orecchio. December 28, 1945 [id number]   Social worker indicated pt was suppose to discharge on 11/16 however discharge earlier on 11/11.  RN attempted the initial transition of care outreach call today however pt not available. Will continue attempts to reach pt for possible [facility] community visits.   Raina Mina, RN Care Management Coordinator [place] [facility] Main Office [phone]

## 2015-11-10 ENCOUNTER — Telehealth (HOSPITAL_COMMUNITY): Payer: Self-pay

## 2015-11-10 DIAGNOSIS — R262 Difficulty in walking, not elsewhere classified: Secondary | ICD-10-CM | POA: Diagnosis not present

## 2015-11-10 DIAGNOSIS — N183 Chronic kidney disease, stage 3 (moderate): Secondary | ICD-10-CM | POA: Diagnosis not present

## 2015-11-10 DIAGNOSIS — I5020 Unspecified systolic (congestive) heart failure: Secondary | ICD-10-CM | POA: Diagnosis not present

## 2015-11-11 DIAGNOSIS — I5020 Unspecified systolic (congestive) heart failure: Secondary | ICD-10-CM | POA: Diagnosis not present

## 2015-11-11 DIAGNOSIS — R262 Difficulty in walking, not elsewhere classified: Secondary | ICD-10-CM | POA: Diagnosis not present

## 2015-11-11 DIAGNOSIS — N183 Chronic kidney disease, stage 3 (moderate): Secondary | ICD-10-CM | POA: Diagnosis not present

## 2015-11-11 NOTE — Telephone Encounter (Signed)
No additional note needed 

## 2015-11-14 ENCOUNTER — Other Ambulatory Visit (HOSPITAL_COMMUNITY): Payer: Self-pay | Admitting: Pharmacist

## 2015-11-14 MED ORDER — HYDRALAZINE HCL 50 MG PO TABS: 75.0000 mg | ORAL_TABLET | Freq: Three times a day (TID) | ORAL | Status: DC

## 2015-11-14 MED ORDER — ISOSORBIDE DINITRATE ER 40 MG PO TBCR: 40.0000 mg | EXTENDED_RELEASE_TABLET | Freq: Three times a day (TID) | ORAL | Status: DC

## 2015-11-15 ENCOUNTER — Telehealth (HOSPITAL_COMMUNITY): Payer: Self-pay | Admitting: Pharmacist

## 2015-11-15 ENCOUNTER — Other Ambulatory Visit: Payer: Self-pay | Admitting: *Deleted

## 2015-11-15 DIAGNOSIS — I5020 Unspecified systolic (congestive) heart failure: Secondary | ICD-10-CM | POA: Diagnosis not present

## 2015-11-15 DIAGNOSIS — N183 Chronic kidney disease, stage 3 (moderate): Secondary | ICD-10-CM | POA: Diagnosis not present

## 2015-11-15 DIAGNOSIS — R262 Difficulty in walking, not elsewhere classified: Secondary | ICD-10-CM | POA: Diagnosis not present

## 2015-11-15 DIAGNOSIS — M6281 Muscle weakness (generalized): Secondary | ICD-10-CM | POA: Diagnosis not present

## 2015-11-15 NOTE — Patient Outreach (Signed)
[place] [facility]) Care Management  11/15/2015  [place]. 1946-01-31 [id number]   Phone call from patient requesting number for [facility].  This social worker suggested that he use the phone number on the back of his insurance card.  Home visit scheduled for 11/21/15 2:00pm.   [place], [place] Management [phone]

## 2015-11-15 NOTE — Telephone Encounter (Signed)
Bidil on back order now so Rx for hydralazine and isordil sent in for the time being. Patient aware and agreeable.   Ruta Hinds. Velva Harman, PharmD, BCPS, CPP Clinical Pharmacist Pager: [phone] Phone: [phone] 11/15/2015 9:33 AM

## 2015-11-15 NOTE — Patient Outreach (Signed)
[place] [facility]) Care Management  11/15/2015  [name]. 05-24-1946 [id number]   Transition of care (week 2)  RN attempted to reach pt today however unsuccessful with a [employer] message indicating party not available. Will continue outreach calls accordingly.  Raina Mina, RN Care Management Coordinator [place] [facility] Main Office [phone]

## 2015-11-18 ENCOUNTER — Other Ambulatory Visit: Payer: Self-pay | Admitting: *Deleted

## 2015-11-18 NOTE — Patient Outreach (Signed)
[facility] [facility]) Care Management  11/18/2015  [facility] [name] March 29, 1946 [id number]   Transition of care (2nd call week two)  RN attempted to contact this pt on provided numbers via EPIC however only able to leave a HIPAA approved voice message. Will continue outreach calls accordingly,  Raina Mina, RN Care Management Coordinator [place] Main Office [phone]

## 2015-11-21 ENCOUNTER — Other Ambulatory Visit: Payer: Self-pay | Admitting: *Deleted

## 2015-11-21 ENCOUNTER — Ambulatory Visit: Payer: Medicare Other | Admitting: *Deleted

## 2015-11-21 DIAGNOSIS — I5020 Unspecified systolic (congestive) heart failure: Secondary | ICD-10-CM | POA: Diagnosis not present

## 2015-11-21 DIAGNOSIS — M6281 Muscle weakness (generalized): Secondary | ICD-10-CM | POA: Diagnosis not present

## 2015-11-21 DIAGNOSIS — R262 Difficulty in walking, not elsewhere classified: Secondary | ICD-10-CM | POA: Diagnosis not present

## 2015-11-21 DIAGNOSIS — N183 Chronic kidney disease, stage 3 (moderate): Secondary | ICD-10-CM | POA: Diagnosis not present

## 2015-11-21 NOTE — Patient Outreach (Signed)
[place] [facility]) Care Management  11/21/2015  Louis Ford. 02/12/1946 [id number]   Phone call to patient canceling home visit with this social worker today.  Patient would like a call on Thursday 11/23/15 to re-schedule.   [name] [facility] Care Management [phone]

## 2015-11-22 ENCOUNTER — Encounter: Payer: Self-pay | Admitting: *Deleted

## 2015-11-22 ENCOUNTER — Other Ambulatory Visit: Payer: Self-pay | Admitting: *Deleted

## 2015-11-22 NOTE — Patient Outreach (Addendum)
[place] [facility]) Care Management  11/22/2015  [place]. Oct 07, 1946 [id number]  Transition of care with this being the 4th attempt unsuccessful. RN able to leave a HIPAA approved voice message requesting a call back. Will mail outreach letter and allow pt to respond for possible [facility] services. If no response will close case and notify pt's primary provider. Will also alert involved social worker [employer] of the unsuccessful contacts on this referral.   Raina Mina, RN Care Management Coordinator [place] [phone]

## 2015-11-23 DIAGNOSIS — N183 Chronic kidney disease, stage 3 (moderate): Secondary | ICD-10-CM | POA: Diagnosis not present

## 2015-11-23 DIAGNOSIS — M6281 Muscle weakness (generalized): Secondary | ICD-10-CM | POA: Diagnosis not present

## 2015-11-23 DIAGNOSIS — R262 Difficulty in walking, not elsewhere classified: Secondary | ICD-10-CM | POA: Diagnosis not present

## 2015-11-23 DIAGNOSIS — I5020 Unspecified systolic (congestive) heart failure: Secondary | ICD-10-CM | POA: Diagnosis not present

## 2015-11-24 DIAGNOSIS — R262 Difficulty in walking, not elsewhere classified: Secondary | ICD-10-CM | POA: Diagnosis not present

## 2015-11-24 DIAGNOSIS — M6281 Muscle weakness (generalized): Secondary | ICD-10-CM | POA: Diagnosis not present

## 2015-11-24 DIAGNOSIS — N183 Chronic kidney disease, stage 3 (moderate): Secondary | ICD-10-CM | POA: Diagnosis not present

## 2015-11-24 DIAGNOSIS — I5020 Unspecified systolic (congestive) heart failure: Secondary | ICD-10-CM | POA: Diagnosis not present

## 2015-11-25 ENCOUNTER — Other Ambulatory Visit: Payer: Self-pay | Admitting: *Deleted

## 2015-11-25 NOTE — Patient Outreach (Signed)
[place] [facility]) Care Management  11/25/2015  [place]. 05/30/1946 [id number]   Phone call to patient to re-schedule home visit.  Home visit re-scheduled for 12/02/15 at 2:30pm to complete in home assessment for social work needs.   [name] [facility] Care Management [phone]

## 2015-11-28 ENCOUNTER — Ambulatory Visit (INDEPENDENT_AMBULATORY_CARE_PROVIDER_SITE_OTHER): Payer: Medicare Other | Admitting: *Deleted

## 2015-11-28 ENCOUNTER — Telehealth (HOSPITAL_COMMUNITY): Payer: Self-pay

## 2015-11-28 ENCOUNTER — Other Ambulatory Visit: Payer: Self-pay | Admitting: *Deleted

## 2015-11-28 DIAGNOSIS — I480 Paroxysmal atrial fibrillation: Secondary | ICD-10-CM

## 2015-11-28 DIAGNOSIS — Z7901 Long term (current) use of anticoagulants: Secondary | ICD-10-CM | POA: Diagnosis not present

## 2015-11-28 DIAGNOSIS — Z5181 Encounter for therapeutic drug level monitoring: Secondary | ICD-10-CM

## 2015-11-28 DIAGNOSIS — I5020 Unspecified systolic (congestive) heart failure: Secondary | ICD-10-CM | POA: Diagnosis not present

## 2015-11-28 DIAGNOSIS — N183 Chronic kidney disease, stage 3 (moderate): Secondary | ICD-10-CM | POA: Diagnosis not present

## 2015-11-28 DIAGNOSIS — I4891 Unspecified atrial fibrillation: Secondary | ICD-10-CM

## 2015-11-28 DIAGNOSIS — M6281 Muscle weakness (generalized): Secondary | ICD-10-CM | POA: Diagnosis not present

## 2015-11-28 DIAGNOSIS — R262 Difficulty in walking, not elsewhere classified: Secondary | ICD-10-CM | POA: Diagnosis not present

## 2015-11-28 LAB — POCT INR: INR: 2.3

## 2015-11-28 MED ORDER — WARFARIN SODIUM 4 MG PO TABS: 4.0000 mg | ORAL_TABLET | ORAL | Status: DC

## 2015-11-28 NOTE — Patient Outreach (Signed)
[facility] [facility]) Care Management  11/28/2015  [place]. 04/18/1946 [id number]   RN received  A call today from the coumadin clinic ([place]) concerning this pt and possible assistance. RN has informed the involved nurse that this nurse has made several attempts to reach the pt and he must be willing to participate in the program for the needed services. RN offered to follow up once again with pt for possible services.   RN contacted pt today and introducer the [facility] services concerning community case management with a visiting nurse. Pt currently involved with [facility] social worker Audiological scientist). RN inquired on possible assistance via [facility] services. Pt states he is currently having issues with his GOUT however feels his current primary is not addressing this issues. Pt mentioned his GOUT medication and inquired who is writing the prescription for her Allopurinol (Dr. Little Ishikawa). States his hands are swelling but he is able to have a light grip and has an appointment with Dr. Haroldine Laws on this Thursday. RN offered to assist and contacted Dr. Clayborne Dana office left a message requesting a call back. Will request a call to the pt for possible earlier appointment if available to avoid possible ED or urgent care visit. RN also requested pt to call and make to same request detailing his issues with his flare up of gout to his hands. Pt denies any flare ups to his lower legs and states no weight gain regarding his HF with a weight today and yesterday of 220 lbs and last week RN verified weight was 223 lbs. Pt continues to report daily adherence with his medications with no delays.  Pt is not aware of the HF zones however feels he is in the GREEN zone after further discussion other then the GOUT flare up to his hands. Denies any issues with breathing and able to eat and sleep well with no problems.  States he is currently involved with [facility] for nursing and PT services weekly but HF has not been  addressed.  RN offered a home visit any day this week starting tomorrow morning however pt prefers for for Friday at 9 AM. RN will completed th initial home visit on Friday at the requested time and provide ongoing education on pt's medical conditions to prevent negative outcomes.  Will schedule the initial home visit and follow up accordingly. Will alert Dr. Clayborne Dana office if called back concerning there brief message left to voice mail.  Raina Mina, RN Care Management Coordinator [place] [facility] Main Office [phone]

## 2015-11-28 NOTE — Telephone Encounter (Signed)
[facility] RN and patient left VM on CHF triage line concerning gout flare up.  Currently taking allopurinol 100mg  as prescribed by Dr. Haroldine Laws.  Hand is swollen.  Pt requests to be seen for flare up sooner than scheduled apt on 12/8, however, no availability, and issue is non CHF related.  Attempted to return call, no answer. Will advise to call PCP to see if they may prescribe him prednisone or other Rx for gout and see him as scheduled this Thursday for CHF f/u.  Renee Pain

## 2015-11-28 NOTE — Telephone Encounter (Signed)
Upon further assessment, patient is only taking 100mg  allopurinol daily (should be on 200mg  daily).  Advised patient to increase to 200mg  daily, reminded of apt this week.  Renee Pain

## 2015-11-30 DIAGNOSIS — M6281 Muscle weakness (generalized): Secondary | ICD-10-CM | POA: Diagnosis not present

## 2015-11-30 DIAGNOSIS — N183 Chronic kidney disease, stage 3 (moderate): Secondary | ICD-10-CM | POA: Diagnosis not present

## 2015-11-30 DIAGNOSIS — I5020 Unspecified systolic (congestive) heart failure: Secondary | ICD-10-CM | POA: Diagnosis not present

## 2015-11-30 DIAGNOSIS — R262 Difficulty in walking, not elsewhere classified: Secondary | ICD-10-CM | POA: Diagnosis not present

## 2015-12-01 ENCOUNTER — Ambulatory Visit (HOSPITAL_COMMUNITY)
Admission: RE | Admit: 2015-12-01 | Discharge: 2015-12-01 | Disposition: A | Discharge status: Home / Self Care | Payer: Medicare Other | Source: Ambulatory Visit | Attending: Internal Medicine | Admitting: Internal Medicine

## 2015-12-01 ENCOUNTER — Encounter (HOSPITAL_COMMUNITY): Payer: Self-pay | Admitting: Internal Medicine

## 2015-12-01 VITALS — BP 104/62 | HR 81 | Wt 230.8 lb

## 2015-12-01 DIAGNOSIS — E785 Hyperlipidemia, unspecified: Secondary | ICD-10-CM | POA: Diagnosis not present

## 2015-12-01 DIAGNOSIS — G40909 Epilepsy, unspecified, not intractable, without status epilepticus: Secondary | ICD-10-CM | POA: Insufficient documentation

## 2015-12-01 DIAGNOSIS — Z95810 Presence of automatic (implantable) cardiac defibrillator: Secondary | ICD-10-CM | POA: Insufficient documentation

## 2015-12-01 DIAGNOSIS — Z7901 Long term (current) use of anticoagulants: Secondary | ICD-10-CM | POA: Diagnosis not present

## 2015-12-01 DIAGNOSIS — I480 Paroxysmal atrial fibrillation: Secondary | ICD-10-CM | POA: Diagnosis not present

## 2015-12-01 DIAGNOSIS — N183 Chronic kidney disease, stage 3 (moderate): Secondary | ICD-10-CM | POA: Insufficient documentation

## 2015-12-01 DIAGNOSIS — D682 Hereditary deficiency of other clotting factors: Secondary | ICD-10-CM | POA: Insufficient documentation

## 2015-12-01 DIAGNOSIS — Z8673 Personal history of transient ischemic attack (TIA), and cerebral infarction without residual deficits: Secondary | ICD-10-CM | POA: Insufficient documentation

## 2015-12-01 DIAGNOSIS — Z87891 Personal history of nicotine dependence: Secondary | ICD-10-CM | POA: Insufficient documentation

## 2015-12-01 DIAGNOSIS — M109 Gout, unspecified: Secondary | ICD-10-CM | POA: Insufficient documentation

## 2015-12-01 DIAGNOSIS — I255 Ischemic cardiomyopathy: Secondary | ICD-10-CM | POA: Insufficient documentation

## 2015-12-01 DIAGNOSIS — Z951 Presence of aortocoronary bypass graft: Secondary | ICD-10-CM | POA: Diagnosis not present

## 2015-12-01 DIAGNOSIS — Z8249 Family history of ischemic heart disease and other diseases of the circulatory system: Secondary | ICD-10-CM | POA: Diagnosis not present

## 2015-12-01 DIAGNOSIS — N289 Disorder of kidney and ureter, unspecified: Secondary | ICD-10-CM

## 2015-12-01 DIAGNOSIS — I2583 Coronary atherosclerosis due to lipid rich plaque: Secondary | ICD-10-CM

## 2015-12-01 DIAGNOSIS — F819 Developmental disorder of scholastic skills, unspecified: Secondary | ICD-10-CM | POA: Insufficient documentation

## 2015-12-01 DIAGNOSIS — I5022 Chronic systolic (congestive) heart failure: Secondary | ICD-10-CM | POA: Diagnosis not present

## 2015-12-01 DIAGNOSIS — I5023 Acute on chronic systolic (congestive) heart failure: Secondary | ICD-10-CM

## 2015-12-01 DIAGNOSIS — I130 Hypertensive heart and chronic kidney disease with heart failure and stage 1 through stage 4 chronic kidney disease, or unspecified chronic kidney disease: Secondary | ICD-10-CM | POA: Insufficient documentation

## 2015-12-01 DIAGNOSIS — Z79899 Other long term (current) drug therapy: Secondary | ICD-10-CM | POA: Insufficient documentation

## 2015-12-01 DIAGNOSIS — I2510 Atherosclerotic heart disease of native coronary artery without angina pectoris: Secondary | ICD-10-CM | POA: Diagnosis not present

## 2015-12-01 LAB — COMPREHENSIVE METABOLIC PANEL
ALT: 16 U/L — AB (ref 17–63)
AST: 22 U/L (ref 15–41)
Albumin: 3.5 g/dL (ref 3.5–5.0)
Alkaline Phosphatase: 115 U/L (ref 38–126)
Anion gap: 12 (ref 5–15)
BUN: 35 mg/dL — ABNORMAL HIGH (ref 6–20)
CALCIUM: 9.2 mg/dL (ref 8.9–10.3)
CO2: 22 mmol/L (ref 22–32)
CREATININE: 1.92 mg/dL — AB (ref 0.61–1.24)
Chloride: 99 mmol/L — ABNORMAL LOW (ref 101–111)
GFR, EST AFRICAN AMERICAN: 40 mL/min — AB (ref >60)
GFR, EST NON AFRICAN AMERICAN: 34 mL/min — AB (ref >60)
Glucose, Bld: 96 mg/dL (ref 65–99)
Potassium: 4.4 mmol/L (ref 3.5–5.1)
Sodium: 133 mmol/L — ABNORMAL LOW (ref 135–145)
TOTAL PROTEIN: 8.1 g/dL (ref 6.5–8.1)
Total Bilirubin: 0.5 mg/dL (ref 0.3–1.2)

## 2015-12-01 LAB — BRAIN NATRIURETIC PEPTIDE: B NATRIURETIC PEPTIDE 5: 70.1 pg/mL (ref 0.0–100.0)

## 2015-12-01 MED ORDER — SPIRONOLACTONE 25 MG PO TABS: 25.0000 mg | ORAL_TABLET | Freq: Every day | ORAL | Status: DC

## 2015-12-01 MED ORDER — AMIODARONE HCL 200 MG PO TABS: 200.0000 mg | ORAL_TABLET | Freq: Every day | ORAL | Status: DC

## 2015-12-01 MED ORDER — ISOSORBIDE DINITRATE ER 40 MG PO TBCR: 40.0000 mg | EXTENDED_RELEASE_TABLET | Freq: Three times a day (TID) | ORAL | Status: DC

## 2015-12-01 MED ORDER — CARVEDILOL 3.125 MG PO TABS: 3.1250 mg | ORAL_TABLET | Freq: Two times a day (BID) | ORAL | Status: DC

## 2015-12-01 MED ORDER — FUROSEMIDE 40 MG PO TABS: 80.0000 mg | ORAL_TABLET | Freq: Every day | ORAL | Status: DC

## 2015-12-01 MED ORDER — HYDRALAZINE HCL 50 MG PO TABS: 75.0000 mg | ORAL_TABLET | Freq: Three times a day (TID) | ORAL | Status: DC

## 2015-12-01 MED ORDER — LOSARTAN POTASSIUM 25 MG PO TABS: 25.0000 mg | ORAL_TABLET | Freq: Every day | ORAL | Status: DC

## 2015-12-01 NOTE — Patient Instructions (Addendum)
Start Carvedilol 3.125 mg Twice daily   Labs today  You have been referred to our [employer], Joellen Jersey is the paramedic who will contact you and make weekly visits to your home  You have been referred to Dr Amil Amen for gout  Your physician recommends that you schedule a follow-up appointment in: 6 weeks

## 2015-12-01 NOTE — Progress Notes (Signed)
ADVANCED HF CLINIC NOTE  Patient ID: Louis Ford., male   DOB: 1946-03-13, 69 y.o.   MRN: [id number]  Primary Care: Dr Lindwood Qua Primary Cardiologist: Dr Lovena Le Primary HF: Dr. Haroldine Laws  HPI: [facility] [name] is a 68 y.o. male with hx of CAD s/p CABG, ICM s/p Biotronik ICD, chronic systolic CHF Echo 99991111 LVEF 30-35%; normal RV; PA peak pressure 34 mm Hg, HTN, HLD, atrial fibrillation, previous stroke, Factor 7 deficiency, and seizure disorder who presented to [facility] office on 10/04/15 with worsening SOB and Afib RVR and was admitted for same with 40 lb weight gain over the past year.   He diuresed well initially on IV lasix 40 mg BID, but continued to have SOB and was thus on milrinoneon 10/14 for concerns of low output. [place] on 10/12/15 that showed markedly depressed cardiac output with Fick CO/CI 3.6/1.6. Improved on milrinone and remained stable with wean with CO-OX 60-70%. Bidil started and increase to goal dose.Also started on amio for afib rate control. D/c'd to [facility]. Overall out 7.6 L and down 15 lbs. D/c weight 229 lbs.  Echo 10/05/15 LVEF 30-35%, Mild MR, PA peak pressure 34 mmHg  RHC 10/12/15 RA = 7 RV = 28/1/8 PA = 32/15 (20) PCW = 12 Fick cardiac output/index = 3.6/1.6 Thermo cardiac output/index = 3.5/1.5 PVR = 2.2 WU PA sat = 44%, 47% Ao sat = 98%  He returns today for follow up. Continues to do well. Trying to limit his fluids.  No SOB as long as he takes his time. Working with HHPT. No orthopnea, PND or edema. Stuill having some problems with gout in his hands. Working on getting PCP. Taking all medications as directed.  The pastor's son is making meals for him. Using his neighbor's scale.  Denies any bleeding on warfarin.   Past Medical History  Diagnosis Date  . CAD (coronary artery disease)   . Chronic systolic heart failure ([place])   . Ischemic cardiomyopathy     EF 15 to 20% by TTE and TEE in 09/2012.  Severe LV dysfunction  . HTN  (hypertension)   . Hyperlipidemia   . Obesity     BMI 31 in 09/2012  . Atrial fibrillation   09/22/2012  . implantable cardiac defibrillator-Biotronik     Device Implanted 2006; s/p gen change 03/2011 : bleeding persistent with pocket erosion and infection; explant and reimplant  06/2011  . Stroke ([place])   . 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 History of MRSA infection 05/2011  . Seizure disorder ([place]) latest 09/30/2012  . 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 Seizures ([place])     Current Outpatient Prescriptions  Medication Sig Dispense Refill  . acetaminophen (TYLENOL) 325 MG tablet Take 2 tablets (650 mg total) by mouth every 6 (six) hours as needed for mild pain. 100 tablet 6  . amiodarone (PACERONE) 200 MG tablet Take 1 tablet (200 mg total) by mouth daily. 30 tablet 6  . furosemide (LASIX) 40 MG tablet Take 2 tablets (80 mg total) by mouth daily. 60 tablet 3  . hydrALAZINE (APRESOLINE) 50 MG tablet Take 1.5 tablets (75 mg total) by mouth 3 (three) times daily. 135 tablet 5  . isosorbide dinitrate (ISOCHRON) 40 MG CR tablet Take 1 tablet (40 mg total) by mouth 3 (three) times daily. 90 tablet 5  . losartan (COZAAR) 25 MG tablet Take 1 tablet (25 mg total) by mouth daily. 30 tablet 6  . phenytoin (DILANTIN) 100  MG ER capsule Take 1 capsule (100 mg total) by mouth 3 (three) times daily. 90 capsule 0  . spironolactone (ALDACTONE) 25 MG tablet Take 1 tablet (25 mg total) by mouth daily. 30 tablet 6  . warfarin (COUMADIN) 4 MG tablet Take 1 tablet (4 mg total) by mouth as directed. 40 tablet 3  . allopurinol (ZYLOPRIM) 100 MG tablet Take 2 tablets (200 mg total) by mouth daily. (Patient not taking: Reported on 12/01/2015) 60 tablet 6   No current facility-administered medications for this encounter.    No Known Allergies    Social History   Social History  . Marital Status: Single    Spouse Name: N/A  . Number of Children: N/A  . Years of Education: N/A   Occupational  History  . Not on file.   Social History Main Topics  . Smoking status: Former Smoker    Types: Pipe    Quit date: 09/21/2008  . Smokeless tobacco: Former Systems developer    Quit date: 09/21/2008  . Alcohol Use: No  . Drug Use: No  . Sexual Activity: No   Other Topics Concern  . Not on file   Social History Narrative   ** Merged History Encounter **          Family History  Problem Relation Age of Onset  . Arthritis Mother   . Heart disease Mother   . Other Father     smoker  . Heart attack Mother   . Hypertension Neg Hx     unknown  . Stroke Neg Hx     unknown    Filed Vitals:   12/01/15 0938  BP: 104/62  Pulse: 81  Weight: 230 lb 12 oz (104.668 kg)  SpO2: 99%    PHYSICAL EXAM: General:  Well appearing. No respiratory difficulty HEENT: normal Neck: supple. no JVD. Carotids 2+ bilat; no bruits. No lymphadenopathy or thyromegaly appreciated. Cor: PMI nondisplaced. Irregularly irregular. No M/G/R. ] Lungs: CTA, Abdomen: soft, nontender, nondistended. No hepatosplenomegaly. No bruits or masses.= BS Extremities: no cyanosis, clubbing, rash. No edema edema  Neuro: alert & oriented x 3, cranial nerves grossly intact. moves all 4 extremities w/o difficulty. Affect pleasant.  ASSESSMENT & PLAN:  1. Chronic Systolic CHF, echo 99991111 LVEF 30-35% - Volume status stable despite weight gain. - Will add low-dose carvedilol 3.125 bid and see if he tolerates. - Will provide a scale - Check BMET and BNP - Enroll Paramedicine program  2. Paroxysmal Atrial Fibrillation  - In sinus on amiodarone 200 mg daily. Will continue.  - On coumadin goes to coumadin clinic 3. CAD s/p CABG 4. HTN - Well-controlled 5. CKD stage III -BMET today 6. Gout - On allopurinol 200 mg daily. Will refer to Dr. Amil Amen in Rheumatology.  7. Learning disability  - Stable  [name], Daniel,MD 10:05 AM

## 2015-12-01 NOTE — Addendum Note (Signed)
Encounter addended by: Scarlette Calico, RN on: 12/01/2015 12:20 PM<BR>     Documentation filed: Dx Association, Orders

## 2015-12-01 NOTE — Addendum Note (Signed)
Encounter addended by: Scarlette Calico, RN on: 12/01/2015 10:34 AM<BR>     Documentation filed: Dx Association, Patient Instructions Section, Orders

## 2015-12-02 ENCOUNTER — Other Ambulatory Visit: Payer: Self-pay | Admitting: *Deleted

## 2015-12-02 ENCOUNTER — Ambulatory Visit: Payer: Medicare Other | Admitting: *Deleted

## 2015-12-02 NOTE — Patient Outreach (Signed)
[place] [facility]) Care Management  12/02/2015  Louis Ford. Mar 03, 1946 [id number]   RN attempted to contact this pt today to verified gate access to his apartment complex however unsuccessful. RN went to apartment office and requested access [place] and appeared at the pt's apartment however no answer at the door. RN able to leave card and request pt to call and rescheduled for today's appointment. RN also contact the involved social worker who is scheduled for an appointment later today concerning the missed appointment today. RN will continue to contact this pt however today's appointment will be noted as a "NO SHOW".    Raina Mina, RN Care Management Coordinator [place] [facility] Main Office [phone]

## 2015-12-06 ENCOUNTER — Other Ambulatory Visit: Payer: Self-pay | Admitting: *Deleted

## 2015-12-06 ENCOUNTER — Encounter: Payer: Self-pay | Admitting: *Deleted

## 2015-12-06 NOTE — Patient Outreach (Signed)
[facility]) Care Management   12/06/2015  [place]. May 07, 1946 [id number]  [facility] Louis Bonito. is an 69 y.o. male  Subjective:  THN: PT remains receptive to enrolling into the [facility] program for community home visits with this RN. HF: Pt states she does not have scales and has only weighed in at the HF clinic. States when a scale was going to be provided the office ran out but he would get one if needed. RN spoke with his primary caregiver over the phone and she has indicated she would get pt a scale today. Pt aware he will have to weight every day. Pt denies any currently signs or symptoms of HF however not aware of the symptoms of HF or what to do. Pt denies having Para-medicine however agency called pt while [facility] RN was visiting today and arrange a home visit for next week.  EDEMA: Pt states he has some swelling however usually none but some noted today. Pt states he is usual lying down with his legs elevated with no swelling however very busy lately and has some swelling to both his lower legs. No skin irritation mentioned. WOUND: Pt and primary caregiver Louis Ford) have mentioned pt recent fell and injured his right thumb with a cut and now the ares has turn dark at the tip of the thumb. Caregiver inquired on what to do at this point.  SAFETY: Pt recently report a fall walking to his mailbox resulting in an injury to his right thumb. Pt states he has applied Peroxide and a bandage but did not consult and doctor after this event.  PRODUCTIVE COUGH:  Pt states he has a productive cough with white color in his sputum with no fevers, chills, or infection like symptoms. States he is taking TUSSIN but only two doses at night none during the day. Pt also taking some lozengers.   Objective:   Review of Systems  Constitutional: Negative.   HENT: Positive for congestion.        Bilateral rhonchi to lower lobes.  Eyes: Negative.   Respiratory: Positive for cough.    Productive cough (white indicates pt)  Cardiovascular: Negative.   Gastrointestinal: Negative.   Genitourinary: Negative.   Musculoskeletal: Negative.   Skin: Negative.   Neurological: Negative.   Endo/Heme/Allergies: Negative.   Psychiatric/Behavioral: Negative.     Physical Exam  Constitutional: He is oriented to person, place, and time. He appears well-developed and well-nourished.  HENT:  Right Ear: External ear normal.  Left Ear: External ear normal.  Neck: Normal range of motion.  Cardiovascular: Normal heart sounds.   Respiratory: Effort normal and breath sounds normal.  GI: Soft. Bowel sounds are normal.  Musculoskeletal: Normal range of motion.  Neurological: He is alert and oriented to person, place, and time.  Skin: Skin is warm and dry.  Psychiatric: He has a normal mood and affect. His behavior is normal. Judgment and thought content normal.    Current Medications:   Current Outpatient Prescriptions  Medication Sig Dispense Refill  . acetaminophen (TYLENOL) 325 MG tablet Take 2 tablets (650 mg total) by mouth every 6 (six) hours as needed for mild pain. 100 tablet 6  . allopurinol (ZYLOPRIM) 100 MG tablet Take 2 tablets (200 mg total) by mouth daily. 60 tablet 6  . amiodarone (PACERONE) 200 MG tablet Take 1 tablet (200 mg total) by mouth daily. 30 tablet 6  . carvedilol (COREG) 3.125 MG tablet Take 1 tablet (3.125 mg total) by mouth  2 (two) times daily. 60 tablet 6  . furosemide (LASIX) 40 MG tablet Take 2 tablets (80 mg total) by mouth daily. 60 tablet 6  . hydrALAZINE (APRESOLINE) 50 MG tablet Take 1.5 tablets (75 mg total) by mouth 3 (three) times daily. 150 tablet 6  . isosorbide dinitrate (ISOCHRON) 40 MG CR tablet Take 1 tablet (40 mg total) by mouth 3 (three) times daily. 90 tablet 6  . losartan (COZAAR) 25 MG tablet Take 1 tablet (25 mg total) by mouth daily. 30 tablet 6  . phenytoin (DILANTIN) 100 MG ER capsule Take 1 capsule (100 mg total) by mouth 3  (three) times daily. 90 capsule 0  . spironolactone (ALDACTONE) 25 MG tablet Take 1 tablet (25 mg total) by mouth daily. 30 tablet 6  . warfarin (COUMADIN) 4 MG tablet Take 1 tablet (4 mg total) by mouth as directed. 40 tablet 3   No current facility-administered medications for this visit.    Functional Status:   In your present state of health, do you have any difficulty performing the following activities: 10/26/2015 10/04/2015  Hearing? N N  Vision? N N  Difficulty concentrating or making decisions? N N  Walking or climbing stairs? Y Y  Dressing or bathing? N N  Doing errands, shopping? N N  Preparing Food and eating ? N -  Using the Toilet? N -  In the past six months, have you accidently leaked urine? N -  Do you have problems with loss of bowel control? N -  Managing your Medications? N -  Managing your Finances? Y -  Housekeeping or managing your Housekeeping? N -    Fall/Depression Screening:    PHQ 2/9 Scores 12/06/2015 10/26/2015  PHQ - 2 Score 0 1   Filed Vitals:   12/06/15 1240  BP: 118/72  Pulse: 67  Resp: 20    Assessment:   Introduction to the [facility] program for case management services Case management related to HF Bilateral edema related to lower leg swelling Potential risk of infection related to cut on his right thumb area. Safety related to recent fall with injury Bilateral congestion related to rhonchi to lower lobes  Plan:  Will obtain a signed consent form for enrollment into the [facility] program and services.  Physical assessment completed with no acute symptoms note concerning his HF. Will educate pt on HF and the purpose of daily monitoring and what to do if acute symptoms occur. Provided [facility] printable material (HF: Keeping Track of your weight each day, Heart Failure: What it is and HF: How to be salt smart) and calendar tool for document all weights and to document the zone for that day. Will discuss the importance of contacting his provider with a  weight gain of 3 lbs over night or 5 lbs within one week with any precipitating signs (pt aware to contact his provider). Will allow teach back method on signs and symptoms of HF on next follow up visit. Para-rmedicine contact pt while RN was visiting today and arrange a home visit for enrollment into the HF program. RN explained what and who para--medicine was and how they contribute monitoring the HF clinic patients. Will discussed bilateral swelling and stress the importance of elevated his lower legs to reduce ongoing swelling. Also discussed compression stockings and the purpose of compression if needed in the future. Due to pt's reporting rare with daily swelling RN will encouraged pt to elevated bilateral lower legs. Will strongly encourage pt to visit the Urgent  care due to the discoloration of the tip of his thumb (note area dark in color). Also spoke with pt's primary caregiver Louis Ford and encourage her to take the pt to the Urgent for medical attention to prevent further issues with the pt's wounded thumb.  Will strongly encouraged pt to be safe both inside and the home. Also encouraged pttot wear the proper shoes when ambulating outside the home as a prevention measure for any risk of falls (note recent fall an accident due to improper shoes worn to the mailbox.  Will strongly encourage pt while at the Urgent care today to inquire on congestion for possible prescription to assist with his current symptoms. Pt currently taking OTC Tussin for Mucus & Chest congestion to assist with her secretions. Will encouraged th allow dosage for daily use to assist pt further or inquire other OTC medication such as Mucinex . Also discussed possible humidifier in the home and how this may assist with his ongoing thick mucus build up. Will discuss plan of care and goals as pt agreed upon today. Will re-evaluate on next home visit and adjust accordingly to allow pt adherence. No other inquires or request at  this time as RN will follow up accordingly.  Louis Mina, RN Care Management Coordinator [place] [phone]   Will discussed bilateral swelling to lower legs and the importance of elevated both legs to reduce ongoing swelling.

## 2015-12-06 NOTE — Patient Outreach (Addendum)
[facility]) Care Management  [facility] Social Work  12/06/2015  Louis Ford. 20-May-1946 [id number]  Subjective:  Patient is a 69 year old male.  Patient reports that he lives with his Pastor's son.  Per patient, his Louis Ford is his primary contact who often assists with care taking responsibilities regarding his health.  Per patient, she will often attend medical appointments with him.  Objective:   Current Medications:  Current Outpatient Prescriptions  Medication Sig Dispense Refill  . acetaminophen (TYLENOL) 325 MG tablet Take 2 tablets (650 mg total) by mouth every 6 (six) hours as needed for mild pain. 100 tablet 6  . allopurinol (ZYLOPRIM) 100 MG tablet Take 2 tablets (200 mg total) by mouth daily. (Patient not taking: Reported on 12/01/2015) 60 tablet 6  . amiodarone (PACERONE) 200 MG tablet Take 1 tablet (200 mg total) by mouth daily. 30 tablet 6  . carvedilol (COREG) 3.125 MG tablet Take 1 tablet (3.125 mg total) by mouth 2 (two) times daily. 60 tablet 6  . furosemide (LASIX) 40 MG tablet Take 2 tablets (80 mg total) by mouth daily. 60 tablet 6  . hydrALAZINE (APRESOLINE) 50 MG tablet Take 1.5 tablets (75 mg total) by mouth 3 (three) times daily. 150 tablet 6  . isosorbide dinitrate (ISOCHRON) 40 MG CR tablet Take 1 tablet (40 mg total) by mouth 3 (three) times daily. 90 tablet 6  . losartan (COZAAR) 25 MG tablet Take 1 tablet (25 mg total) by mouth daily. 30 tablet 6  . phenytoin (DILANTIN) 100 MG ER capsule Take 1 capsule (100 mg total) by mouth 3 (three) times daily. 90 capsule 0  . spironolactone (ALDACTONE) 25 MG tablet Take 1 tablet (25 mg total) by mouth daily. 30 tablet 6  . warfarin (COUMADIN) 4 MG tablet Take 1 tablet (4 mg total) by mouth as directed. 40 tablet 3   No current facility-administered medications for this visit.    Functional Status:  In your present state of health, do you have any difficulty performing the following activities:  10/26/2015 10/04/2015  Hearing? N N  Vision? N N  Difficulty concentrating or making decisions? N N  Walking or climbing stairs? Y Y  Dressing or bathing? N N  Doing errands, shopping? N N  Preparing Food and eating ? N -  Using the Toilet? N -  In the past six months, have you accidently leaked urine? N -  Do you have problems with loss of bowel control? N -  Managing your Medications? N -  Managing your Finances? Y -  Housekeeping or managing your Housekeeping? N -    Fall/Depression Screening:  PHQ 2/9 Scores 10/26/2015  PHQ - 2 Score 1    Assessment:  Co-visit with RNCM Louis Ford.  Patient seen in the home .  [facility] services discussed, consent form signed.  Per patient he will be switching primary care providers.  Spoke with his pastor/caregiver Louis Ford [phone] who states that she has scheduled patient with Louis Holm, MD through [facility].  Appointment scheduled for 12/30/15 at 2pm.   Patient continues to receive physica therapy through [facility]  Physical Therapist-Louis Ford [phone].   Next appointment with  Dr. Haroldine Ford on 01/10/16 at 10:00am.  Patient  currently lives with a roommate(Pastors son). Louis Ford provides transportation to all medical appointments, she also fills her pill box.  Patient also uses [facility].  Patient does not have an Advanced Directive, copy given to review for completion during next visit.  Per patient, he splits the cost of food with roommate, receives a pension and social security and is able to afford medications.  Per patient, he has  grab bars in the bathroom and a shower chair to help reduce falls.    Patient interested in Silver sneakers benefit.  Phone call made to [facility] to check eligibility.  Patient does not have silver sneakers as a benefit on his medical plan.  Patient instructed to contact his insurance company to see what plan he could switch to  In order to utilize this benefit.    Patient denies history of depression  or anxiety.  Denies  current substance abuse.  Patient friendly, engaging during visit.       Plan: Advanced Directive to be completed during next visit      [place] Problem One        Most Recent Value   Care Plan Problem One  patient currentlly in skilled nursing facility   Role Documenting the Problem One  Clinical Social Worker   Care Plan for Problem One  Active   THN CM Short Term Goal #1 (0-30 days)  patient will complete his advanced directive within 30 days   THN CM Short Term Goal #1 Start Date  12/06/15   [facility] CM Short Term Goal #1 Met Date  11/04/15   Interventions for Short Term Goal #1  This Education officer, museum gave copy of advanced directive toi review for next  vvisit   THN CM Short Term Goal #2 (0-30 days)  patient and LCSW will work with discharge planner regarding discharge plan to ensure a safe and stable discharg back home for the next 2 weeks   THN CM Short Term Goal #2 Start Date  10/26/15   [facility] CM Short Term Goal #2 Met Date  11/04/15   Interventions for Short Term Goal #2  LCSW spoke to discharge planner regarding  tentative discharge [place], [place] Care Management [phone]

## 2015-12-14 ENCOUNTER — Encounter (HOSPITAL_COMMUNITY): Payer: Self-pay | Admitting: *Deleted

## 2015-12-14 DIAGNOSIS — N183 Chronic kidney disease, stage 3 (moderate): Secondary | ICD-10-CM | POA: Diagnosis not present

## 2015-12-14 DIAGNOSIS — R262 Difficulty in walking, not elsewhere classified: Secondary | ICD-10-CM | POA: Diagnosis not present

## 2015-12-14 DIAGNOSIS — I5020 Unspecified systolic (congestive) heart failure: Secondary | ICD-10-CM | POA: Diagnosis not present

## 2015-12-14 DIAGNOSIS — M6281 Muscle weakness (generalized): Secondary | ICD-10-CM | POA: Diagnosis not present

## 2015-12-14 NOTE — Progress Notes (Signed)
At last OV Dr Haroldine Laws referred pt to Dr Amil Amen for gout, appt was sch for 12/20.  Received fax from his office that pt no-showed for appt, they contacted pt to resch and he said he would call them back to resch.

## 2015-12-16 DIAGNOSIS — I5020 Unspecified systolic (congestive) heart failure: Secondary | ICD-10-CM | POA: Diagnosis not present

## 2015-12-16 DIAGNOSIS — N183 Chronic kidney disease, stage 3 (moderate): Secondary | ICD-10-CM | POA: Diagnosis not present

## 2015-12-16 DIAGNOSIS — M6281 Muscle weakness (generalized): Secondary | ICD-10-CM | POA: Diagnosis not present

## 2015-12-16 DIAGNOSIS — R262 Difficulty in walking, not elsewhere classified: Secondary | ICD-10-CM | POA: Diagnosis not present

## 2015-12-22 DIAGNOSIS — M6281 Muscle weakness (generalized): Secondary | ICD-10-CM | POA: Diagnosis not present

## 2015-12-22 DIAGNOSIS — I5020 Unspecified systolic (congestive) heart failure: Secondary | ICD-10-CM | POA: Diagnosis not present

## 2015-12-22 DIAGNOSIS — R262 Difficulty in walking, not elsewhere classified: Secondary | ICD-10-CM | POA: Diagnosis not present

## 2015-12-22 DIAGNOSIS — N183 Chronic kidney disease, stage 3 (moderate): Secondary | ICD-10-CM | POA: Diagnosis not present

## 2015-12-27 ENCOUNTER — Ambulatory Visit (INDEPENDENT_AMBULATORY_CARE_PROVIDER_SITE_OTHER): Payer: Medicare Other | Admitting: *Deleted

## 2015-12-27 DIAGNOSIS — Z5181 Encounter for therapeutic drug level monitoring: Secondary | ICD-10-CM | POA: Diagnosis not present

## 2015-12-27 DIAGNOSIS — M6281 Muscle weakness (generalized): Secondary | ICD-10-CM | POA: Diagnosis not present

## 2015-12-27 DIAGNOSIS — Z7901 Long term (current) use of anticoagulants: Secondary | ICD-10-CM

## 2015-12-27 DIAGNOSIS — I480 Paroxysmal atrial fibrillation: Secondary | ICD-10-CM | POA: Diagnosis not present

## 2015-12-27 DIAGNOSIS — N183 Chronic kidney disease, stage 3 (moderate): Secondary | ICD-10-CM | POA: Diagnosis not present

## 2015-12-27 DIAGNOSIS — I4891 Unspecified atrial fibrillation: Secondary | ICD-10-CM

## 2015-12-27 DIAGNOSIS — I5020 Unspecified systolic (congestive) heart failure: Secondary | ICD-10-CM | POA: Diagnosis not present

## 2015-12-27 DIAGNOSIS — R262 Difficulty in walking, not elsewhere classified: Secondary | ICD-10-CM | POA: Diagnosis not present

## 2015-12-27 LAB — POCT INR: INR: 1.5

## 2015-12-29 ENCOUNTER — Other Ambulatory Visit: Payer: Self-pay | Admitting: *Deleted

## 2015-12-29 NOTE — Patient Outreach (Signed)
[facility]) Care Management  12/29/2015  Shaban Aparicio. 1946/01/20 [id number]  Phone call to patient who had difficulty getting his seizure medication re-filled.  Patient contacted urgent care and received a 10 day supply.  Patient has a doctor's appointment with Pricilla Holm, MD on 12/30/15 ay 2:00pm .  Home visit scheduled for 01/04/16 at 1:00pm to complete his Advanced Directive.    [name] [facility] Care Management [phone]

## 2015-12-30 ENCOUNTER — Encounter: Payer: Self-pay | Admitting: Internal Medicine

## 2015-12-30 ENCOUNTER — Ambulatory Visit (INDEPENDENT_AMBULATORY_CARE_PROVIDER_SITE_OTHER): Payer: Medicare Other | Admitting: Internal Medicine

## 2015-12-30 ENCOUNTER — Other Ambulatory Visit (INDEPENDENT_AMBULATORY_CARE_PROVIDER_SITE_OTHER): Payer: Medicare Other

## 2015-12-30 VITALS — BP 108/80 | HR 82 | Temp 98.5°F | Resp 16 | Ht 71.0 in | Wt 231.0 lb

## 2015-12-30 DIAGNOSIS — Z23 Encounter for immunization: Secondary | ICD-10-CM | POA: Diagnosis not present

## 2015-12-30 DIAGNOSIS — I5023 Acute on chronic systolic (congestive) heart failure: Secondary | ICD-10-CM

## 2015-12-30 DIAGNOSIS — Z0000 Encounter for general adult medical examination without abnormal findings: Secondary | ICD-10-CM

## 2015-12-30 DIAGNOSIS — M109 Gout, unspecified: Secondary | ICD-10-CM | POA: Diagnosis not present

## 2015-12-30 DIAGNOSIS — S6991XA Unspecified injury of right wrist, hand and finger(s), initial encounter: Secondary | ICD-10-CM | POA: Insufficient documentation

## 2015-12-30 DIAGNOSIS — I5022 Chronic systolic (congestive) heart failure: Secondary | ICD-10-CM

## 2015-12-30 DIAGNOSIS — I10 Essential (primary) hypertension: Secondary | ICD-10-CM

## 2015-12-30 DIAGNOSIS — R569 Unspecified convulsions: Secondary | ICD-10-CM

## 2015-12-30 DIAGNOSIS — N289 Disorder of kidney and ureter, unspecified: Secondary | ICD-10-CM | POA: Diagnosis not present

## 2015-12-30 LAB — COMPREHENSIVE METABOLIC PANEL
ALK PHOS: 126 U/L — AB (ref 39–117)
ALT: 15 U/L (ref 0–53)
AST: 22 U/L (ref 0–37)
Albumin: 3.8 g/dL (ref 3.5–5.2)
BILIRUBIN TOTAL: 0.4 mg/dL (ref 0.2–1.2)
BUN: 22 mg/dL (ref 6–23)
CALCIUM: 9.5 mg/dL (ref 8.4–10.5)
CO2: 29 meq/L (ref 19–32)
CREATININE: 1.35 mg/dL (ref 0.40–1.50)
Chloride: 104 mEq/L (ref 96–112)
GFR: 67.78 mL/min (ref >60.00)
Glucose, Bld: 102 mg/dL — ABNORMAL HIGH (ref 70–99)
Potassium: 5 mEq/L (ref 3.5–5.1)
Sodium: 137 mEq/L (ref 135–145)
TOTAL PROTEIN: 7.7 g/dL (ref 6.0–8.3)

## 2015-12-30 LAB — LIPID PANEL
CHOL/HDL RATIO: 4
Cholesterol: 245 mg/dL — ABNORMAL HIGH (ref 0–200)
HDL: 54.7 mg/dL (ref >39.00)
LDL Cholesterol: 171 mg/dL — ABNORMAL HIGH (ref 0–99)
NONHDL: 190.3
TRIGLYCERIDES: 95 mg/dL (ref 0.0–149.0)
VLDL: 19 mg/dL (ref 0.0–40.0)

## 2015-12-30 LAB — URIC ACID: Uric Acid, Serum: 5.6 mg/dL (ref 4.0–7.8)

## 2015-12-30 MED ORDER — ALLOPURINOL 100 MG PO TABS: 200.0000 mg | ORAL_TABLET | Freq: Every day | ORAL | Status: DC

## 2015-12-30 MED ORDER — PHENYTOIN SODIUM EXTENDED 100 MG PO CAPS: 100.0000 mg | ORAL_CAPSULE | Freq: Three times a day (TID) | ORAL | Status: DC

## 2015-12-30 NOTE — Assessment & Plan Note (Signed)
Advised that he can use ice for pain and the nail may fall off and should grow back normally.

## 2015-12-30 NOTE — Assessment & Plan Note (Signed)
Stable today and weight good. He is having paramedics check on him from time to time. Taking meds as per HTN. Following with cardiology.

## 2015-12-30 NOTE — Progress Notes (Signed)
   Subjective:    Patient ID: Louis Dandy., male    DOB: Nov 12, 1946, 70 y.o.   MRN: [id number]  HPI The patient is a 70 YO man coming in new for right thumb injury. He tripped and fell against a wall and smashed his finger. Initially it was bleeding and hurting a lot. He cleaned with peroxide and has been keeping covered since that time. Mild swelling initially which is gradually improving. No fevers or chills. No discharge. Please see A/P for status and treatment of chronic medical problems.   PMH, [facility], social history reviewed and updated.   Review of Systems  Constitutional: Positive for activity change. Negative for fever, chills, appetite change, fatigue and unexpected weight change.  HENT: Negative.   Respiratory: Negative for cough, chest tightness, shortness of breath and wheezing.   Cardiovascular: Negative for chest pain, palpitations and leg swelling.  Gastrointestinal: Negative for nausea, abdominal pain, diarrhea, constipation and abdominal distention.  Musculoskeletal: Positive for gait problem. Negative for myalgias, back pain and arthralgias.  Skin: Negative.   Neurological: Positive for dizziness and weakness. Negative for light-headedness, numbness and headaches.  Psychiatric/Behavioral: Negative.       Objective:   Physical Exam  Constitutional: He is oriented to person, place, and time. He appears well-developed and well-nourished.  HENT:  Head: Normocephalic and atraumatic.  Eyes: EOM are normal.  Neck: Normal range of motion.  Cardiovascular: Normal rate and regular rhythm.   Pulmonary/Chest: Effort normal and breath sounds normal. No respiratory distress. He has no wheezes.  Abdominal: Soft. Bowel sounds are normal. He exhibits no distension. There is no tenderness. There is no rebound.  Musculoskeletal: He exhibits no edema.  Neurological: He is alert and oriented to person, place, and time.  Skin: Skin is warm and dry.  Psychiatric: He has a normal mood  and affect.   Filed Vitals:   12/30/15 1411  BP: 108/80  Pulse: 82  Temp: 98.5 F (36.9 C)  TempSrc: Oral  Resp: 16  Height: 5\' 11"  (1.803 m)  Weight: 231 lb (104.781 kg)  SpO2: 98%      Assessment & Plan:  Prevnar 13 given at visit.

## 2015-12-30 NOTE — Assessment & Plan Note (Signed)
Taken his last pill this morning of phenytoin. Rx sent in today and checking levels. He is not seeing neurology currently and his last PCP was prescribing. Okay to prescribe for now. If recurrent episodes or side effects will have him see neurology.

## 2015-12-30 NOTE — Progress Notes (Signed)
Pre visit review using our clinic review tool, if applicable. No additional management support is needed unless otherwise documented below in the visit note. 

## 2015-12-30 NOTE — Assessment & Plan Note (Signed)
Checking CMP today and BP at goal.

## 2015-12-30 NOTE — Patient Instructions (Signed)
We are checking some blood work today and will call you with the results.   The heart doctor will be able to see the results too so will likely not have to take blood work next visit.   Fall Prevention in the Home  Falls can cause injuries and can affect people from all age groups. There are many simple things that you can do to make your home safe and to help prevent falls. WHAT CAN I DO ON THE OUTSIDE OF MY HOME?  Regularly repair the edges of walkways and driveways and fix any cracks.  Remove high doorway thresholds.  Trim any shrubbery on the main path into your home.  Use bright outdoor lighting.  Clear walkways of debris and clutter, including tools and rocks.  Regularly check that handrails are securely fastened and in good repair. Both sides of any steps should have handrails.  Install guardrails along the edges of any raised decks or porches.  Have leaves, snow, and ice cleared regularly.  Use sand or salt on walkways during winter months.  In the garage, clean up any spills right away, including grease or oil spills. WHAT CAN I DO IN THE BATHROOM?  Use night lights.  Install grab bars by the toilet and in the tub and shower. Do not use towel bars as grab bars.  Use non-skid mats or decals on the floor of the tub or shower.  If you need to sit down while you are in the shower, use a plastic, non-slip stool.[name]  Keep the floor dry. Immediately clean up any water that spills on the floor.  Remove soap buildup in the tub or shower on a regular basis.  Attach bath mats securely with double-sided non-slip rug tape.  Remove throw rugs and other tripping hazards from the floor. WHAT CAN I DO IN THE BEDROOM?  Use night lights.  Make sure that a bedside light is easy to reach.  Do not use oversized bedding that drapes onto the floor.  Have a firm chair that has side arms to use for getting dressed.  Remove throw rugs and other tripping hazards from the floor. WHAT  CAN I DO IN THE KITCHEN?   Clean up any spills right away.  Avoid walking on wet floors.  Place frequently used items in easy-to-reach places.  If you need to reach for something above you, use a sturdy step stool that has a grab bar.  Keep electrical cables out of the way.  Do not use floor polish or wax that makes floors slippery. If you have to use wax, make sure that it is non-skid floor wax.  Remove throw rugs and other tripping hazards from the floor. WHAT CAN I DO IN THE STAIRWAYS?  Do not leave any items on the stairs.  Make sure that there are handrails on both sides of the stairs. Fix handrails that are broken or loose. Make sure that handrails are as long as the stairways.  Check any carpeting to make sure that it is firmly attached to the stairs. Fix any carpet that is loose or worn.  Avoid having throw rugs at the top or bottom of stairways, or secure the rugs with carpet tape to prevent them from moving.  Make sure that you have a light switch at the top of the stairs and the bottom of the stairs. If you do not have them, have them installed. WHAT ARE SOME OTHER FALL PREVENTION TIPS?  Wear closed-toe shoes that fit well  and support your feet. Wear shoes that have rubber soles or low heels.  When you use a stepladder, make sure that it is completely opened and that the sides are firmly locked. Have someone hold the ladder while you are using it. Do not climb a closed stepladder.  Add color or contrast paint or tape to grab bars and handrails in your home. Place contrasting color strips on the first and last steps.  Use mobility aids as needed, such as canes, walkers, scooters, and crutches.  Turn on lights if it is dark. Replace any light bulbs that burn out.  Set up furniture so that there are clear paths. Keep the furniture in the same spot.  Fix any uneven floor surfaces.  Choose a carpet design that does not hide the edge of steps of a stairway.  Be aware  of any and all pets.  Review your medicines with your healthcare provider. Some medicines can cause dizziness or changes in blood pressure, which increase your risk of falling. Talk with your health care provider about other ways that you can decrease your risk of falls. This may include working with a physical therapist or trainer to improve your strength, balance, and endurance.   This information is not intended to replace advice given to you by your health care provider. Make sure you discuss any questions you have with your health care provider.   Document Released: 11/30/2002 Document Revised: 04/26/2015 Document Reviewed: 01/14/2015 Elsevier Interactive Patient Education [employer].

## 2015-12-30 NOTE — Assessment & Plan Note (Signed)
BP at goal today, taking amiodarone, coreg, lasix, hydralazine, isosorbide dinitrate, losartan, spironolactone. Checking CMP and adjust as needed.

## 2015-12-30 NOTE — Assessment & Plan Note (Signed)
Checking uric acid level. On allopurinol twice daily which may be too much with his kidney function. Adjust as needed.

## 2015-12-31 LAB — PHENYTOIN LEVEL, TOTAL: PHENYTOIN LVL: 27.7 ug/mL — AB (ref 10.0–20.0)

## 2015-12-31 LAB — HEPATITIS C ANTIBODY: HCV AB: NEGATIVE

## 2016-01-02 ENCOUNTER — Other Ambulatory Visit: Payer: Self-pay | Admitting: Internal Medicine

## 2016-01-02 DIAGNOSIS — R569 Unspecified convulsions: Secondary | ICD-10-CM

## 2016-01-03 ENCOUNTER — Ambulatory Visit: Payer: Self-pay | Admitting: *Deleted

## 2016-01-05 ENCOUNTER — Other Ambulatory Visit: Payer: Self-pay

## 2016-01-05 ENCOUNTER — Ambulatory Visit (INDEPENDENT_AMBULATORY_CARE_PROVIDER_SITE_OTHER): Payer: Medicare Other

## 2016-01-05 DIAGNOSIS — Z7901 Long term (current) use of anticoagulants: Secondary | ICD-10-CM

## 2016-01-05 DIAGNOSIS — Z5181 Encounter for therapeutic drug level monitoring: Secondary | ICD-10-CM | POA: Diagnosis not present

## 2016-01-05 DIAGNOSIS — I5023 Acute on chronic systolic (congestive) heart failure: Secondary | ICD-10-CM

## 2016-01-05 DIAGNOSIS — I4891 Unspecified atrial fibrillation: Secondary | ICD-10-CM

## 2016-01-05 LAB — POCT INR: INR: 2.9

## 2016-01-05 NOTE — Telephone Encounter (Signed)
Pt seen in Coumadin Clinic today, requests refill on Isosorbide and Spironolactone sent to [facility] [address].

## 2016-01-06 ENCOUNTER — Other Ambulatory Visit: Payer: Self-pay | Admitting: *Deleted

## 2016-01-06 NOTE — Patient Outreach (Signed)
[facility]) Care Management  [facility] Social Work  01/06/2016  [place]. 09-Jan-1946 [id number]  Subjective:  Patient is a 70 year old male.  Per patient he needs to find a new roommate.  Per patient current roommate has a record and according to lease agreement cannot reside on the property.  Objective:   Current Medications:  Current Outpatient Prescriptions  Medication Sig Dispense Refill  . acetaminophen (TYLENOL) 325 MG tablet Take 2 tablets (650 mg total) by mouth every 6 (six) hours as needed for mild pain. 100 tablet 6  . allopurinol (ZYLOPRIM) 100 MG tablet Take 2 tablets (200 mg total) by mouth daily. 180 tablet 3  . amiodarone (PACERONE) 200 MG tablet Take 1 tablet (200 mg total) by mouth daily. 30 tablet 6  . carvedilol (COREG) 3.125 MG tablet Take 1 tablet (3.125 mg total) by mouth 2 (two) times daily. 60 tablet 6  . furosemide (LASIX) 40 MG tablet Take 2 tablets (80 mg total) by mouth daily. 60 tablet 6  . hydrALAZINE (APRESOLINE) 50 MG tablet Take 1.5 tablets (75 mg total) by mouth 3 (three) times daily. 150 tablet 6  . isosorbide dinitrate (ISOCHRON) 40 MG CR tablet Take 1 tablet (40 mg total) by mouth 3 (three) times daily. 90 tablet 6  . losartan (COZAAR) 25 MG tablet Take 1 tablet (25 mg total) by mouth daily. 30 tablet 6  . phenytoin (DILANTIN) 100 MG ER capsule Take 1 capsule (100 mg total) by mouth 3 (three) times daily. 90 capsule 5  . spironolactone (ALDACTONE) 25 MG tablet Take 1 tablet (25 mg total) by mouth daily. 30 tablet 6  . warfarin (COUMADIN) 4 MG tablet Take 1 tablet (4 mg total) by mouth as directed. 40 tablet 3   No current facility-administered medications for this visit.    Functional Status:  In your present state of health, do you have any difficulty performing the following activities: 10/26/2015 10/04/2015  Hearing? N N  Vision? N N  Difficulty concentrating or making decisions? N N  Walking or climbing stairs? Y Y   Dressing or bathing? N N  Doing errands, shopping? N N  Preparing Food and eating ? N -  Using the Toilet? N -  In the past six months, have you accidently leaked urine? N -  Do you have problems with loss of bowel control? N -  Managing your Medications? N -  Managing your Finances? Y -  Housekeeping or managing your Housekeeping? N -    Fall/Depression Screening:  PHQ 2/9 Scores 12/06/2015 10/26/2015  PHQ - 2 Score 0 1    Assessment:  Home visit to patient's home to complete his Advanced Directive. Patient provided this social worker a list of all upcoming appointments.  Patient currently on blood thinners, next appointment with coumadin clinic is on 01/16/16 at 2:15pm.  Next appointment with Dr. Haroldine Laws scheduled for 01/10/16  at 10:00am.  Primary care doctor scheduled for 04/02/16 at 1:15pm.  Patient uses [employer] bus to get to his appointment.  Patient's pastor called during the visit.  Per patient's Doristine Bosworth his roommate has a record and has to move out of the apartment. Her concern is patient's fear of living alone and nit having someone there he can trust won't take advantage of him.  Alternative options explored, adult care homes and assisted living, however patient refuses these options. Per patient's pastor, she will work on finding patient another apartment with current roommate.  Patient's advanced directive  discussed.  Patient did not want to complete this without his Doristine Bosworth present.  Patient and pastor agreed to schedule appointment with Pastoral Care at [facility]. Voicemail message left with Pastoral [phone] to schedule date and time to complete this.    [name] [facility] Care Management [phone]  Plan:

## 2016-01-09 ENCOUNTER — Ambulatory Visit (INDEPENDENT_AMBULATORY_CARE_PROVIDER_SITE_OTHER): Payer: Medicare Other | Admitting: *Deleted

## 2016-01-09 DIAGNOSIS — I255 Ischemic cardiomyopathy: Secondary | ICD-10-CM | POA: Diagnosis not present

## 2016-01-10 ENCOUNTER — Other Ambulatory Visit (HOSPITAL_COMMUNITY): Payer: Self-pay | Admitting: Internal Medicine

## 2016-01-10 ENCOUNTER — Ambulatory Visit (HOSPITAL_COMMUNITY)
Admission: RE | Admit: 2016-01-10 | Discharge: 2016-01-10 | Disposition: A | Discharge status: Home / Self Care | Payer: Medicare Other | Source: Ambulatory Visit | Attending: Internal Medicine | Admitting: Internal Medicine

## 2016-01-10 VITALS — BP 94/56 | HR 87 | Wt 238.0 lb

## 2016-01-10 DIAGNOSIS — I480 Paroxysmal atrial fibrillation: Secondary | ICD-10-CM | POA: Insufficient documentation

## 2016-01-10 DIAGNOSIS — I130 Hypertensive heart and chronic kidney disease with heart failure and stage 1 through stage 4 chronic kidney disease, or unspecified chronic kidney disease: Secondary | ICD-10-CM | POA: Insufficient documentation

## 2016-01-10 DIAGNOSIS — I2510 Atherosclerotic heart disease of native coronary artery without angina pectoris: Secondary | ICD-10-CM | POA: Diagnosis not present

## 2016-01-10 DIAGNOSIS — F819 Developmental disorder of scholastic skills, unspecified: Secondary | ICD-10-CM | POA: Insufficient documentation

## 2016-01-10 DIAGNOSIS — I5022 Chronic systolic (congestive) heart failure: Secondary | ICD-10-CM | POA: Diagnosis not present

## 2016-01-10 DIAGNOSIS — M109 Gout, unspecified: Secondary | ICD-10-CM | POA: Diagnosis not present

## 2016-01-10 DIAGNOSIS — Z951 Presence of aortocoronary bypass graft: Secondary | ICD-10-CM | POA: Insufficient documentation

## 2016-01-10 DIAGNOSIS — N183 Chronic kidney disease, stage 3 (moderate): Secondary | ICD-10-CM | POA: Diagnosis not present

## 2016-01-10 DIAGNOSIS — I2583 Coronary atherosclerosis due to lipid rich plaque: Secondary | ICD-10-CM

## 2016-01-10 LAB — BASIC METABOLIC PANEL
ANION GAP: 8 (ref 5–15)
BUN: 17 mg/dL (ref 6–20)
CHLORIDE: 103 mmol/L (ref 101–111)
CO2: 27 mmol/L (ref 22–32)
Calcium: 8.9 mg/dL (ref 8.9–10.3)
Creatinine, Ser: 1.28 mg/dL — ABNORMAL HIGH (ref 0.61–1.24)
GFR, EST NON AFRICAN AMERICAN: 56 mL/min — AB (ref >60)
GLUCOSE: 97 mg/dL (ref 65–99)
Potassium: 4.4 mmol/L (ref 3.5–5.1)
SODIUM: 138 mmol/L (ref 135–145)

## 2016-01-10 MED ORDER — LOSARTAN POTASSIUM 25 MG PO TABS: 25.0000 mg | ORAL_TABLET | Freq: Every day | ORAL | Status: DC

## 2016-01-10 MED ORDER — ISOSORBIDE DINITRATE ER 40 MG PO TBCR: 40.0000 mg | EXTENDED_RELEASE_TABLET | Freq: Three times a day (TID) | ORAL | Status: DC

## 2016-01-10 MED ORDER — SPIRONOLACTONE 25 MG PO TABS: 12.5000 mg | ORAL_TABLET | Freq: Every day | ORAL | Status: DC

## 2016-01-10 MED ORDER — CARVEDILOL 3.125 MG PO TABS: 3.1250 mg | ORAL_TABLET | Freq: Two times a day (BID) | ORAL | Status: DC

## 2016-01-10 MED ORDER — AMIODARONE HCL 200 MG PO TABS: 200.0000 mg | ORAL_TABLET | Freq: Every day | ORAL | Status: DC

## 2016-01-10 MED ORDER — FUROSEMIDE 40 MG PO TABS: 80.0000 mg | ORAL_TABLET | Freq: Every day | ORAL | Status: DC

## 2016-01-10 MED ORDER — HYDRALAZINE HCL 50 MG PO TABS: 75.0000 mg | ORAL_TABLET | Freq: Three times a day (TID) | ORAL | Status: DC

## 2016-01-10 NOTE — Progress Notes (Signed)
Patient ID: Louis Ford., male   DOB: 02-Dec-1946, 70 y.o.   MRN: [id number]  ADVANCED HF CLINIC NOTE  Patient ID: Louis Ford., male   DOB: [birth year], 70 y.o.   MRN: [id number]  Primary Care: Dr Pricilla Holm Primary Cardiologist: Dr Lovena Le Primary HF: Dr. Haroldine Laws  HPI: [facility] Louis Ford. is a 70 y.o. male with hx of CAD s/p CABG, ICM s/p Biotronik ICD, chronic systolic CHF Echo 99991111 LVEF 30-35%; normal RV; PA peak pressure 34 mm Hg, HTN, HLD, atrial fibrillation, previous stroke, Factor 7 deficiency, and seizure disorder who presented to [facility] office on 10/04/15 with worsening SOB and Afib RVR and was admitted for same with 40 lb weight gain over Louis past year.   He diuresed well initially on IV lasix 40 mg BID, but continued to have SOB and was thus on milrinoneon 10/14 for concerns of low output. RHC on 10/12/15 that showed depressed cardiac output with Fick CO/CI 3.6/1.6. Improved on milrinone and remained stable with wean with CO-OX 60-70%. Bidil started and increase to goal dose.Also started on amio for afib rate control. D/c'd to [facility]. Overall out 7.6 L and down 15 lbs. D/c weight 229 lbs.  Echo 10/05/15 LVEF 30-35%, Mild MR, PA peak pressure 34 mmHg  RHC 10/12/15 RA = 7 RV = 28/1/8 PA = 32/15 (20) PCW = 12 Fick cardiac output/index = 3.6/1.6 Thermo cardiac output/index = 3.5/1.5 PVR = 2.2 WU PA sat = 44%, 47% Ao sat = 98%  He returns today for follow up. Continues to do well. Trying to limit his fluids but not watching his diet so well. No SOB as long as he takes his time.  No orthopnea, PND or edema. Louis Ford.  Taking all medications as directed. Ran out of spironolactone. Louis Ford is making meals for him. Weighing every 2-3 days.  Denies any bleeding on warfarin.  Labs: 12/30/15  K 5.0, creatinine 1.35   Past Medical History  Diagnosis Date  . CAD (coronary artery disease)   . Chronic  systolic heart failure ([place])   . Ischemic cardiomyopathy     EF 15 to 20% by TTE and TEE in 09/2012.  Severe LV dysfunction  . HTN (hypertension)   . Hyperlipidemia   . Obesity     BMI 31 in 09/2012  . Atrial fibrillation   09/22/2012  . implantable cardiac defibrillator-Biotronik     Device Implanted 2006; s/p gen change 03/2011 : bleeding persistent with pocket erosion and infection; explant and reimplant  06/2011  . Stroke ([place])   . 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 History of MRSA infection 05/2011  . Seizure disorder ([place]) latest 09/30/2012  . 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 Seizures ([place])     Current Outpatient Prescriptions  Medication Sig Dispense Refill  . acetaminophen (TYLENOL) 325 MG tablet Take 2 tablets (650 mg total) by mouth every 6 (six) hours as needed for mild pain. 100 tablet 6  . allopurinol (ZYLOPRIM) 100 MG tablet Take 2 tablets (200 mg total) by mouth daily. 180 tablet 3  . amiodarone (PACERONE) 200 MG tablet Take 1 tablet (200 mg total) by mouth daily. 30 tablet 6  . carvedilol (COREG) 3.125 MG tablet Take 1 tablet (3.125 mg total) by mouth 2 (two) times daily. 60 tablet 6  . furosemide (LASIX) 40 MG tablet Take 2 tablets (80 mg total) by mouth daily. 60 tablet 6  .  hydrALAZINE (APRESOLINE) 50 MG tablet Take 1.5 tablets (75 mg total) by mouth 3 (three) times daily. 150 tablet 6  . isosorbide dinitrate (ISOCHRON) 40 MG CR tablet Take 1 tablet (40 mg total) by mouth 3 (three) times daily. 90 tablet 6  . losartan (COZAAR) 25 MG tablet Take 1 tablet (25 mg total) by mouth daily. 30 tablet 6  . phenytoin (DILANTIN) 100 MG ER capsule Take 1 capsule (100 mg total) by mouth 3 (three) times daily. 90 capsule 5  . warfarin (COUMADIN) 4 MG tablet Take 1 tablet (4 mg total) by mouth as directed. 40 tablet 3   No current facility-administered medications for this encounter.    No Known Allergies    Social History   Social History  . Marital Status:  Single    Spouse Name: N/A  . Number of Children: N/A  . Years of Education: N/A   Occupational History  . Not on file.   Social History Main Topics  . Smoking status: Former Smoker    Types: Pipe    Quit date: 09/21/2008  . Smokeless tobacco: Former Systems developer    Quit date: 09/21/2008  . Alcohol Use: No  . Drug Use: No  . Sexual Activity: No   Other Topics Concern  . Not on file   Social History Narrative   ** Merged History Encounter **          Family History  Problem Relation Age of Onset  . Arthritis Mother   . Heart disease Mother   . Other Father     smoker  . Heart attack Mother   . Hypertension Neg Hx     unknown  . Stroke Neg Hx     unknown    Filed Vitals:   01/10/16 1038  BP: 94/56  Pulse: 87  Weight: 238 lb (107.956 kg)  SpO2: 98%    PHYSICAL EXAM: General:  Well appearing. No respiratory difficulty HEENT: normal Neck: supple. no JVD. Carotids 2+ bilat; no bruits. No lymphadenopathy or thyromegaly appreciated. Cor: PMI nondisplaced. Irregularly irregular. No M/G/R. ] Lungs: CTA, Abdomen: soft, nontender, nondistended. No hepatosplenomegaly. No bruits or masses.= BS Extremities: no cyanosis, clubbing, rash. No edema edema  Neuro: alert & oriented x 3, cranial nerves grossly intact. moves all 4 extremities w/o difficulty. Affect pleasant.  ASSESSMENT & PLAN:  1. Chronic Systolic CHF, echo 99991111 LVEF 30-35% - Volume status stable despite weight gain. - Continue carvedilol 3.125 bid, hydralazine/nitrates, losartan, lasix 80 daily - Restart spiro 12.5mg  daily. Given K 5.0 recently will need to follow closely. Check BMET today and 1 week.  - BP too low to switch to Entresto or titrate other meds - Check BMET and BNP - Enroll Paramedicine program  2. Paroxysmal Atrial Fibrillation  - In sinus on amiodarone 200 mg daily. Will continue.  - On coumadin goes to coumadin clinic 3. CAD s/p CABG 4. HTN - Well-controlled 5. CKD stage III -BMET  today 6. Gout - On allopurinol 200 mg daily. Followed by PCP  7. Learning disability  - Stable  Earvin Blazier,MD 11:08 AM

## 2016-01-10 NOTE — Progress Notes (Signed)
Remote ICD transmission.   

## 2016-01-10 NOTE — Addendum Note (Signed)
Encounter addended by: Scarlette Calico, RN on: 01/10/2016 11:35 AM<BR>     Documentation filed: Dx Association, Patient Instructions Section, Orders

## 2016-01-10 NOTE — Patient Instructions (Signed)
Restart Spironolactone at 12.5 mg (1/2 tab) daily  Labs today  Labs in 1 week  Your physician recommends that you schedule a follow-up appointment in: 2 months with an echocardiogram

## 2016-01-10 NOTE — Addendum Note (Signed)
Encounter addended by: Scarlette Calico, RN on: 01/10/2016  1:08 PM<BR>     Documentation filed: Dx Association, Orders

## 2016-01-12 ENCOUNTER — Other Ambulatory Visit: Payer: Self-pay | Admitting: *Deleted

## 2016-01-12 NOTE — Patient Outreach (Signed)
[facility]) Care Management   01/12/2016  [facility]. 08/20/1946 [id number]  [facility] [name] is an 70 y.o. male  Subjective:  HF: Pt reports his daily weights over the last two days however unable to recall last weeks readings with no documentation presented. Pt states he will start writing down all his readings in his calendar. Denies any signs or symptoms of HF and limited with reciting some of the symptoms but aware of what to do if encountered. Pt will also review the educational material provided and attempt to recite more signs or symptoms related to HF on next visit.  No other inquires at this time. EDEMA: Pt reports some swelling to his lower ankles and legs however improved since last visit. Pt denies ongoing elevation to his lower legs. MEDICATIONS: Pt requested RN to call in 3 refills on his medications. Pt also reports she has not been taking his Spironolactone in two days but will obtain tomorrow when ready at his local pharmacy.  Pt reports using [employer] for his ongoing transportation services to all his medical appointments with no problems and reports his has completed the MOST form with a doctor's signature.    Objective:   Review of Systems  Constitutional: Negative.   HENT: Negative.   Eyes: Negative.   Respiratory: Negative.   Cardiovascular: Negative.   Gastrointestinal: Negative.   Genitourinary: Negative.   Musculoskeletal: Negative.   Skin: Negative.   Neurological: Negative.   Endo/Heme/Allergies: Negative.   Psychiatric/Behavioral: Negative.     Physical Exam  Constitutional: He is oriented to person, place, and time. He appears well-developed and well-nourished.  HENT:  Right Ear: External ear normal.  Left Ear: External ear normal.  Eyes: EOM are normal.  Neck: Normal range of motion.  Cardiovascular: Normal heart sounds.   Respiratory: Effort normal and breath sounds normal.  GI: Soft. Bowel sounds are normal.   Musculoskeletal: He exhibits edema.  Trace edema to lower extremities  Neurological: He is alert and oriented to person, place, and time.  Skin: Skin is warm and dry.  Psychiatric: He has a normal mood and affect. His behavior is normal. Judgment and thought content normal.    Current Medications:   Current Outpatient Prescriptions  Medication Sig Dispense Refill  . acetaminophen (TYLENOL) 325 MG tablet Take 2 tablets (650 mg total) by mouth every 6 (six) hours as needed for mild pain. 100 tablet 6  . allopurinol (ZYLOPRIM) 100 MG tablet Take 2 tablets (200 mg total) by mouth daily. 180 tablet 3  . amiodarone (PACERONE) 200 MG tablet Take 1 tablet (200 mg total) by mouth daily. 30 tablet 6  . carvedilol (COREG) 3.125 MG tablet Take 1 tablet (3.125 mg total) by mouth 2 (two) times daily. 60 tablet 6  . furosemide (LASIX) 40 MG tablet Take 2 tablets (80 mg total) by mouth daily. 60 tablet 6  . hydrALAZINE (APRESOLINE) 50 MG tablet Take 1.5 tablets (75 mg total) by mouth 3 (three) times daily. 150 tablet 6  . isosorbide dinitrate (ISOCHRON) 40 MG CR tablet TAKE ONE TABLET BY MOUTH THREE TIMES DAILY 90 tablet 0  . losartan (COZAAR) 25 MG tablet Take 1 tablet (25 mg total) by mouth daily. 30 tablet 6  . phenytoin (DILANTIN) 100 MG ER capsule Take 1 capsule (100 mg total) by mouth 3 (three) times daily. 90 capsule 5  . spironolactone (ALDACTONE) 25 MG tablet Take 0.5 tablets (12.5 mg total) by mouth daily. 15 tablet 6  .  warfarin (COUMADIN) 4 MG tablet Take 1 tablet (4 mg total) by mouth as directed. 40 tablet 3   No current facility-administered medications for this visit.    Functional Status:   In your present state of health, do you have any difficulty performing the following activities: 01/12/2016 10/26/2015  Hearing? N N  Vision? N N  Difficulty concentrating or making decisions? Y N  Walking or climbing stairs? N Y  Dressing or bathing? N N  Doing errands, shopping? N N  Preparing  Food and eating ? N N  Using the Toilet? N N  In the past six months, have you accidently leaked urine? N N  Do you have problems with loss of bowel control? N N  Managing your Medications? N N  Managing your Finances? N Y  Housekeeping or managing your Housekeeping? N N    Fall/Depression Screening:    PHQ 2/9 Scores 01/12/2016 12/06/2015 10/26/2015  PHQ - 2 Score 0 0 1   Filed Vitals:   01/12/16 1001  BP: 100/70  Pulse: 72  Resp: 20    Assessment:   Ongoing case management related to HF Bilateral edema related to lower extremities Non-adherent related to medication (Spironolactone) Follow up with [facility] social worker ( Mrs. Orene Desanctis) related to A.D  Plan:  Physical assessment completed with no acute findings noted today. Will review HF zones and provided educational material with pt and inquired on pt's knowledge base related. Will review HF zones and verify pt's remains in the GREEN zone today with no precipitating symptoms encountered over the last month. Will strongly encouraged pt to review the provided information and document all readings in his provider calendar for his providers to view. Will discussed the importance of daily monitoring to prevent hospitalization and if pt is non-adherent with the plan of care the risk of re-entering the hospital for acute symptoms (pt with understanding). Will strongly encouraged pt to continue to elevate his lower legs. Note swelling much improved from last month's visit. Will re-evaluate lower extremities on next home visit for adherence.  Will call in pt's needed refills and stress the importance of taking all his prescribed medications to avoid possible symptoms. Will encouraged pt to call his refills in a timely manner. Will update involved team member [employer] on pt's progress with the MOST form ( pt aware to place on refrigerator in view) and update pt on the delay for completing his A.D. Currently awaiting on Chaplain to meet with pt and  primary caregiver [name] to arrange a scheduled apointment date. Plan of care and goals discussed with pt's understanding. Will reschedule next month's home visit according to pt's scheduled visits.  Raina Mina, RN Care Management Coordinator [place] Office [phone]

## 2016-01-13 LAB — CUP PACEART REMOTE DEVICE CHECK
Brady Statistic RV Percent Paced: 0 %
HIGH POWER IMPEDANCE MEASURED VALUE: 60 Ohm
Lead Channel Impedance Value: 509 Ohm
Lead Channel Pacing Threshold Amplitude: 0.5 V
Lead Channel Pacing Threshold Pulse Width: 0.4 ms
Lead Channel Sensing Intrinsic Amplitude: 12.9 mV
Lead Channel Setting Pacing Amplitude: 2 V
Lead Channel Setting Pacing Pulse Width: 0.4 ms
MDC IDC LEAD IMPLANT DT: 20060927
MDC IDC LEAD LOCATION: 753860
MDC IDC LEAD SERIAL: 10260064
MDC IDC MSMT BATTERY REMAINING PERCENTAGE: 46 %
MDC IDC MSMT BATTERY VOLTAGE: 3.07 V
MDC IDC SESS DTM: 20170120144828
MDC IDC SET LEADCHNL RV SENSING SENSITIVITY: 0.8 mV
Pulse Gen Serial Number: 60613417

## 2016-01-19 ENCOUNTER — Other Ambulatory Visit: Payer: Self-pay | Admitting: *Deleted

## 2016-01-19 NOTE — Patient Outreach (Signed)
[facility]) Care Management  01/19/2016  Louis Ford. Jun 16, 1946 [id number]   Phone call to the Chaplain's office at [facility] to schedule an appointment for patient to get his Advanced Directive completed.  The Chaplains office would like patient to call.  Phone call to patient's Louis Ford requesting that she call the chaplain's office to schedule an appointment.  Appointment scheduled for 01/24/16 at 3:00pm.   [name] [facility] Care Management [phone]

## 2016-01-20 ENCOUNTER — Ambulatory Visit (HOSPITAL_COMMUNITY)
Admission: RE | Admit: 2016-01-20 | Discharge: 2016-01-20 | Disposition: A | Discharge status: Home / Self Care | Payer: Medicare Other | Source: Ambulatory Visit | Attending: Cardiology | Admitting: Cardiology

## 2016-01-20 ENCOUNTER — Encounter: Payer: Self-pay | Admitting: Cardiology

## 2016-01-20 DIAGNOSIS — I5022 Chronic systolic (congestive) heart failure: Secondary | ICD-10-CM | POA: Insufficient documentation

## 2016-01-24 ENCOUNTER — Other Ambulatory Visit: Payer: Self-pay | Admitting: *Deleted

## 2016-01-24 NOTE — Patient Outreach (Signed)
[place] [facility]) Care Management  01/24/2016  Louis Ford. Jan 03, 1946 [id number]   Phone call to Marcell Barlow to confirm meeting today with Pastoral Care to complete patient's Advanced Directive.  Appointment confirmed for today at [place] [place] Care Management [phone]

## 2016-01-24 NOTE — Patient Outreach (Signed)
  [facility]) Care Management  [facility] Social Work  01/24/2016  [place]. May 24, 1946 [id number]  Subjective:  Patient is a 69 year old male.  Objective:   Current Medications:  Current Outpatient Prescriptions  Medication Sig Dispense Refill  . acetaminophen (TYLENOL) 325 MG tablet Take 2 tablets (650 mg total) by mouth every 6 (six) hours as needed for mild pain. 100 tablet 6  . allopurinol (ZYLOPRIM) 100 MG tablet Take 2 tablets (200 mg total) by mouth daily. 180 tablet 3  . amiodarone (PACERONE) 200 MG tablet Take 1 tablet (200 mg total) by mouth daily. 30 tablet 6  . carvedilol (COREG) 3.125 MG tablet Take 1 tablet (3.125 mg total) by mouth 2 (two) times daily. 60 tablet 6  . furosemide (LASIX) 40 MG tablet Take 2 tablets (80 mg total) by mouth daily. 60 tablet 6  . hydrALAZINE (APRESOLINE) 50 MG tablet Take 1.5 tablets (75 mg total) by mouth 3 (three) times daily. 150 tablet 6  . isosorbide dinitrate (ISOCHRON) 40 MG CR tablet TAKE ONE TABLET BY MOUTH THREE TIMES DAILY 90 tablet 0  . losartan (COZAAR) 25 MG tablet Take 1 tablet (25 mg total) by mouth daily. 30 tablet 6  . phenytoin (DILANTIN) 100 MG ER capsule Take 1 capsule (100 mg total) by mouth 3 (three) times daily. 90 capsule 5  . spironolactone (ALDACTONE) 25 MG tablet Take 0.5 tablets (12.5 mg total) by mouth daily. 15 tablet 6  . warfarin (COUMADIN) 4 MG tablet Take 1 tablet (4 mg total) by mouth as directed. 40 tablet 3   No current facility-administered medications for this visit.    Functional Status:  In your present state of health, do you have any difficulty performing the following activities: 01/12/2016 10/26/2015  Hearing? N N  Vision? N N  Difficulty concentrating or making decisions? Y N  Walking or climbing stairs? N Y  Dressing or bathing? N N  Doing errands, shopping? N N  Preparing Food and eating ? N N  Using the Toilet? N N  In the past six months, have you accidently leaked  urine? N N  Do you have problems with loss of bowel control? N N  Managing your Medications? N N  Managing your Finances? N Y  Housekeeping or managing your Housekeeping? N N    Fall/Depression Screening:  PHQ 2/9 Scores 01/12/2016 12/06/2015 10/26/2015  PHQ - 2 Score 0 0 1    Assessment: This Education officer, museum met with patient, patient's pastor Mardene Celeste at [facility] to complete his advanced directive.  Met with pastoral care who walked patient through the document and notarized it.  Patient and his pastor given the original and copies of the document.  Original to either be given to medical records or to his doctor's office to be uploaded to his chart.  Plan: This Education officer, museum to follow up with patient in approximately 2 weeks to assess for continued social work needs.  If no further needs, patient's case to be closed.    [name] [facility] Care Management [phone]

## 2016-01-30 ENCOUNTER — Ambulatory Visit (INDEPENDENT_AMBULATORY_CARE_PROVIDER_SITE_OTHER): Payer: Medicare Other | Admitting: *Deleted

## 2016-01-30 DIAGNOSIS — Z7901 Long term (current) use of anticoagulants: Secondary | ICD-10-CM

## 2016-01-30 DIAGNOSIS — Z5181 Encounter for therapeutic drug level monitoring: Secondary | ICD-10-CM

## 2016-01-30 DIAGNOSIS — I4891 Unspecified atrial fibrillation: Secondary | ICD-10-CM | POA: Diagnosis not present

## 2016-01-30 LAB — POCT INR: INR: 3.7

## 2016-02-09 ENCOUNTER — Other Ambulatory Visit: Payer: Self-pay | Admitting: *Deleted

## 2016-02-09 NOTE — Patient Outreach (Signed)
  [name]  02/09/2016  Louis Ford. 09/03/1946 [id number]  Subjective:  Patient is a 70 year old African American male.  Objective:   Current Medications:  Current Outpatient Prescriptions  Medication Sig Dispense Refill  . acetaminophen (TYLENOL) 325 MG tablet Take 2 tablets (650 mg total) by mouth every 6 (six) hours as needed for mild pain. 100 tablet 6  . allopurinol (ZYLOPRIM) 100 MG tablet Take 2 tablets (200 mg total) by mouth daily. 180 tablet 3  . amiodarone (PACERONE) 200 MG tablet Take 1 tablet (200 mg total) by mouth daily. 30 tablet 6  . carvedilol (COREG) 3.125 MG tablet Take 1 tablet (3.125 mg total) by mouth 2 (two) times daily. 60 tablet 6  . furosemide (LASIX) 40 MG tablet Take 2 tablets (80 mg total) by mouth daily. 60 tablet 6  . hydrALAZINE (APRESOLINE) 50 MG tablet Take 1.5 tablets (75 mg total) by mouth 3 (three) times daily. 150 tablet 6  . isosorbide dinitrate (ISOCHRON) 40 MG CR tablet TAKE ONE TABLET BY MOUTH THREE TIMES DAILY 90 tablet 0  . losartan (COZAAR) 25 MG tablet Take 1 tablet (25 mg total) by mouth daily. 30 tablet 6  . phenytoin (DILANTIN) 100 MG ER capsule Take 1 capsule (100 mg total) by mouth 3 (three) times daily. 90 capsule 5  . spironolactone (ALDACTONE) 25 MG tablet Take 0.5 tablets (12.5 mg total) by mouth daily. 15 tablet 6  . warfarin (COUMADIN) 4 MG tablet Take 1 tablet (4 mg total) by mouth as directed. 40 tablet 3   No current facility-administered medications for this visit.    Functional Status:  In your present state of health, do you have any difficulty performing the following activities: 01/12/2016 10/26/2015  Hearing? N N  Vision? N N  Difficulty concentrating or making decisions? Y N  Walking or climbing stairs? N Y  Dressing or bathing? N N  Doing errands, shopping? N N  Preparing Food and eating ? N N  Using the Toilet? N N  In the past six months, have you  accidently leaked urine? N N  Do you have problems with loss of bowel control? N N  Managing your Medications? N N  Managing your Finances? N Y  Housekeeping or managing your Housekeeping? N N    Fall/Depression Screening:  PHQ 2/9 Scores 01/12/2016 12/06/2015 10/26/2015  PHQ - 2 Score 0 0 1    Assessment:  Case closure visit. Needs assessment completed, patient verbalized having no further social work needs. Patient informed that his case would be closed to social work at this time.  This social worker's contact information given if any future needs were to arise.  Near the end of the visit patient tripped over a chair and fell.  Patient denied any injury, however was encouraged to go to urgent care or contact his doctor if the need arises.  Patient verbalized an understanding, however stated that he was not injured and felt no pain.  Plan:  Patient's case to be close to social work at this time.  RNCM notified of case closure and patient's fall.  [name] [facility] Care Management [phone]

## 2016-02-10 ENCOUNTER — Encounter: Payer: Self-pay | Admitting: *Deleted

## 2016-02-13 ENCOUNTER — Other Ambulatory Visit: Payer: Self-pay | Admitting: Physician Assistant

## 2016-02-13 ENCOUNTER — Ambulatory Visit (INDEPENDENT_AMBULATORY_CARE_PROVIDER_SITE_OTHER): Payer: Medicare Other | Admitting: Pharmacist

## 2016-02-13 ENCOUNTER — Telehealth: Payer: Self-pay | Admitting: Physician Assistant

## 2016-02-13 ENCOUNTER — Other Ambulatory Visit (HOSPITAL_COMMUNITY): Payer: Self-pay | Admitting: Pharmacist

## 2016-02-13 DIAGNOSIS — Z5181 Encounter for therapeutic drug level monitoring: Secondary | ICD-10-CM

## 2016-02-13 DIAGNOSIS — Z7901 Long term (current) use of anticoagulants: Secondary | ICD-10-CM | POA: Diagnosis not present

## 2016-02-13 DIAGNOSIS — I5022 Chronic systolic (congestive) heart failure: Secondary | ICD-10-CM

## 2016-02-13 DIAGNOSIS — I4891 Unspecified atrial fibrillation: Secondary | ICD-10-CM

## 2016-02-13 LAB — POCT INR: INR: 4.2

## 2016-02-13 MED ORDER — SPIRONOLACTONE 25 MG PO TABS: 12.5000 mg | ORAL_TABLET | Freq: Every day | ORAL | Status: DC

## 2016-02-13 MED ORDER — FUROSEMIDE 40 MG PO TABS: 80.0000 mg | ORAL_TABLET | Freq: Every day | ORAL | Status: DC

## 2016-02-13 MED ORDER — ISOSORBIDE DINITRATE ER 40 MG PO TBCR: 40.0000 mg | EXTENDED_RELEASE_TABLET | Freq: Three times a day (TID) | ORAL | Status: DC

## 2016-02-13 NOTE — Telephone Encounter (Signed)
Patient with afib run out of spironolactone, isorsorbide and lasix. I have refilled these three medications.  Hilbert Corrigan PA Pager: [phone]

## 2016-02-15 ENCOUNTER — Encounter: Payer: Self-pay | Admitting: *Deleted

## 2016-02-15 ENCOUNTER — Other Ambulatory Visit: Payer: Self-pay | Admitting: *Deleted

## 2016-02-15 NOTE — Patient Outreach (Signed)
[facility]) Care Management   02/15/2016  [place]. [birth year] [id number]  [facility] [name] is an 70 y.o. male  Subjective:  HF: Pt states some days he forgets to weight but his weight is going down with a weight today 224 lbs. Denies any signs or symptoms of HF with no congestion, no SOB encountered and remains in the GREEN zone today. Pt able to recite signs and symptoms of distress and when to contact his provider.  MEDICATION: Pt verifies all his medications and that he is taking all medications as prescribed. Pt states he has all medications but has one medications at his local pharmacy that he will obtain today. No other issues or request at this time as no other resources needed at this time.  Pt with understanding of the HF zones as reviewed monthly and pt with understanding on acute symptoms and what to do. Pt feels he is able to monitor his HF and does not at this time need ongoing services with [facility] for a health coach.   Objective:   Review of Systems  Constitutional: Negative.   HENT: Negative.   Eyes: Negative.   Respiratory: Negative.   Cardiovascular: Negative.   Gastrointestinal: Negative.   Genitourinary: Negative.   Musculoskeletal: Negative.   Skin: Negative.   Neurological: Negative.   Endo/Heme/Allergies: Negative.   Psychiatric/Behavioral: Negative.     Physical Exam  Constitutional: He is oriented to person, place, and time. He appears well-developed and well-nourished.  Neck: Normal range of motion.  Cardiovascular: Normal heart sounds.   Respiratory: Effort normal and breath sounds normal.  GI: Soft. Bowel sounds are normal.  Musculoskeletal: Normal range of motion.  Neurological: He is alert and oriented to person, place, and time.  Skin: Skin is warm and dry.  Psychiatric: He has a normal mood and affect. His behavior is normal. Judgment and thought content normal.    Current Medications:   Current Outpatient Prescriptions   Medication Sig Dispense Refill  . acetaminophen (TYLENOL) 325 MG tablet Take 2 tablets (650 mg total) by mouth every 6 (six) hours as needed for mild pain. 100 tablet 6  . allopurinol (ZYLOPRIM) 100 MG tablet Take 2 tablets (200 mg total) by mouth daily. 180 tablet 3  . amiodarone (PACERONE) 200 MG tablet Take 1 tablet (200 mg total) by mouth daily. 30 tablet 6  . carvedilol (COREG) 3.125 MG tablet Take 1 tablet (3.125 mg total) by mouth 2 (two) times daily. 60 tablet 6  . furosemide (LASIX) 40 MG tablet Take 2 tablets (80 mg total) by mouth daily. 60 tablet 6  . hydrALAZINE (APRESOLINE) 50 MG tablet Take 1.5 tablets (75 mg total) by mouth 3 (three) times daily. 150 tablet 6  . isosorbide dinitrate (ISOCHRON) 40 MG CR tablet Take 1 tablet (40 mg total) by mouth 3 (three) times daily. 90 tablet 5  . losartan (COZAAR) 25 MG tablet Take 1 tablet (25 mg total) by mouth daily. 30 tablet 6  . phenytoin (DILANTIN) 100 MG ER capsule Take 1 capsule (100 mg total) by mouth 3 (three) times daily. 90 capsule 5  . spironolactone (ALDACTONE) 25 MG tablet Take 0.5 tablets (12.5 mg total) by mouth daily. 15 tablet 6  . warfarin (COUMADIN) 4 MG tablet Take 1 tablet (4 mg total) by mouth as directed. 40 tablet 3   No current facility-administered medications for this visit.    Functional Status:   In your present state of health, do you have  any difficulty performing the following activities: 01/12/2016 10/26/2015  Hearing? N N  Vision? N N  Difficulty concentrating or making decisions? Y N  Walking or climbing stairs? N Y  Dressing or bathing? N N  Doing errands, shopping? N N  Preparing Food and eating ? N N  Using the Toilet? N N  In the past six months, have you accidently leaked urine? N N  Do you have problems with loss of bowel control? N N  Managing your Medications? N N  Managing your Finances? N Y  Housekeeping or managing your Housekeeping? N N    Fall/Depression Screening:    PHQ 2/9  Scores 01/12/2016 12/06/2015 10/26/2015  PHQ - 2 Score 0 0 1   Filed Vitals:   02/15/16 1241  BP: 110/68  Pulse: 74  Resp: 20   Assessment:   Follow up ongoing case management related to HF Swelling to right lower leg and hand Follow up medication issues related to adherence  Plan:  Will completed a phsycial assessment with no acute symptoms found today. Will verify today's weight and continue to encourage daily weights and the importance of earlier interventions to prevent acute symptoms. 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 Denies any other swelling as pt verified swelling is "much better" after medications are taken. Will also continue to encouraged pt to elevate his lower leg to reduce ongoing swelling. Will also verified pt is aware when to seek medical attending and.or contact her provider if swelling gets worse or not resolved. Will verified pt has all his medications as taking as prescribed with no needed refills. RN strongly encouraged pt on daily medications to prevent acute symptoms from occurring.  Plan of care discussed along with all goals that have been met. No other resources or issues that need to be addressed at this time. Will verified pt has [facility] contact number if needed in the future. Will notify primary provided of case closure with all goals met.   Raina Mina, RN Care Management Coordinator [place] Office [phone]

## 2016-02-26 ENCOUNTER — Encounter (HOSPITAL_COMMUNITY): Payer: Self-pay

## 2016-02-26 ENCOUNTER — Emergency Department (HOSPITAL_COMMUNITY): Payer: Medicare Other

## 2016-02-26 ENCOUNTER — Observation Stay (HOSPITAL_COMMUNITY)
Admission: EM | Admit: 2016-02-26 | Discharge: 2016-02-29 | Disposition: A | Discharge status: Home / Self Care | Payer: Medicare Other | Attending: Internal Medicine | Admitting: Internal Medicine

## 2016-02-26 DIAGNOSIS — M109 Gout, unspecified: Secondary | ICD-10-CM | POA: Diagnosis not present

## 2016-02-26 DIAGNOSIS — Z23 Encounter for immunization: Secondary | ICD-10-CM | POA: Diagnosis not present

## 2016-02-26 DIAGNOSIS — Z8673 Personal history of transient ischemic attack (TIA), and cerebral infarction without residual deficits: Secondary | ICD-10-CM | POA: Diagnosis not present

## 2016-02-26 DIAGNOSIS — Z951 Presence of aortocoronary bypass graft: Secondary | ICD-10-CM | POA: Diagnosis not present

## 2016-02-26 DIAGNOSIS — Z79899 Other long term (current) drug therapy: Secondary | ICD-10-CM | POA: Insufficient documentation

## 2016-02-26 DIAGNOSIS — I10 Essential (primary) hypertension: Secondary | ICD-10-CM | POA: Diagnosis present

## 2016-02-26 DIAGNOSIS — Z7901 Long term (current) use of anticoagulants: Secondary | ICD-10-CM | POA: Diagnosis not present

## 2016-02-26 DIAGNOSIS — J9811 Atelectasis: Secondary | ICD-10-CM | POA: Diagnosis not present

## 2016-02-26 DIAGNOSIS — I130 Hypertensive heart and chronic kidney disease with heart failure and stage 1 through stage 4 chronic kidney disease, or unspecified chronic kidney disease: Secondary | ICD-10-CM | POA: Insufficient documentation

## 2016-02-26 DIAGNOSIS — I255 Ischemic cardiomyopathy: Secondary | ICD-10-CM | POA: Diagnosis not present

## 2016-02-26 DIAGNOSIS — R52 Pain, unspecified: Secondary | ICD-10-CM

## 2016-02-26 DIAGNOSIS — M1712 Unilateral primary osteoarthritis, left knee: Secondary | ICD-10-CM | POA: Diagnosis not present

## 2016-02-26 DIAGNOSIS — Z95810 Presence of automatic (implantable) cardiac defibrillator: Secondary | ICD-10-CM | POA: Diagnosis not present

## 2016-02-26 DIAGNOSIS — I482 Chronic atrial fibrillation: Secondary | ICD-10-CM | POA: Insufficient documentation

## 2016-02-26 DIAGNOSIS — I4819 Other persistent atrial fibrillation: Secondary | ICD-10-CM | POA: Diagnosis present

## 2016-02-26 DIAGNOSIS — Z87891 Personal history of nicotine dependence: Secondary | ICD-10-CM | POA: Insufficient documentation

## 2016-02-26 DIAGNOSIS — I639 Cerebral infarction, unspecified: Secondary | ICD-10-CM | POA: Diagnosis present

## 2016-02-26 DIAGNOSIS — I2510 Atherosclerotic heart disease of native coronary artery without angina pectoris: Secondary | ICD-10-CM | POA: Insufficient documentation

## 2016-02-26 DIAGNOSIS — I5022 Chronic systolic (congestive) heart failure: Secondary | ICD-10-CM | POA: Diagnosis present

## 2016-02-26 DIAGNOSIS — R531 Weakness: Secondary | ICD-10-CM | POA: Diagnosis not present

## 2016-02-26 DIAGNOSIS — R55 Syncope and collapse: Principal | ICD-10-CM | POA: Diagnosis present

## 2016-02-26 DIAGNOSIS — Z6832 Body mass index (BMI) 32.0-32.9, adult: Secondary | ICD-10-CM | POA: Diagnosis not present

## 2016-02-26 DIAGNOSIS — Z66 Do not resuscitate: Secondary | ICD-10-CM | POA: Diagnosis not present

## 2016-02-26 DIAGNOSIS — E669 Obesity, unspecified: Secondary | ICD-10-CM | POA: Insufficient documentation

## 2016-02-26 DIAGNOSIS — R404 Transient alteration of awareness: Secondary | ICD-10-CM | POA: Diagnosis not present

## 2016-02-26 DIAGNOSIS — M25462 Effusion, left knee: Secondary | ICD-10-CM | POA: Diagnosis not present

## 2016-02-26 DIAGNOSIS — I480 Paroxysmal atrial fibrillation: Secondary | ICD-10-CM | POA: Insufficient documentation

## 2016-02-26 DIAGNOSIS — D682 Hereditary deficiency of other clotting factors: Secondary | ICD-10-CM | POA: Diagnosis not present

## 2016-02-26 DIAGNOSIS — N183 Chronic kidney disease, stage 3 (moderate): Secondary | ICD-10-CM | POA: Diagnosis not present

## 2016-02-26 DIAGNOSIS — N179 Acute kidney failure, unspecified: Secondary | ICD-10-CM | POA: Diagnosis not present

## 2016-02-26 DIAGNOSIS — R569 Unspecified convulsions: Secondary | ICD-10-CM

## 2016-02-26 DIAGNOSIS — E785 Hyperlipidemia, unspecified: Secondary | ICD-10-CM | POA: Diagnosis not present

## 2016-02-26 DIAGNOSIS — G40909 Epilepsy, unspecified, not intractable, without status epilepticus: Secondary | ICD-10-CM | POA: Diagnosis not present

## 2016-02-26 LAB — URINE MICROSCOPIC-ADD ON: RBC / HPF: NONE SEEN RBC/hpf (ref 0–5)

## 2016-02-26 LAB — CBC
HCT: 45.3 % (ref 39.0–52.0)
HEMOGLOBIN: 15.1 g/dL (ref 13.0–17.0)
MCH: 29 pg (ref 26.0–34.0)
MCHC: 33.3 g/dL (ref 30.0–36.0)
MCV: 87.1 fL (ref 78.0–100.0)
Platelets: 137 10*3/uL — ABNORMAL LOW (ref 150–400)
RBC: 5.2 MIL/uL (ref 4.22–5.81)
RDW: 17.7 % — ABNORMAL HIGH (ref 11.5–15.5)
WBC: 4.7 10*3/uL (ref 4.0–10.5)

## 2016-02-26 LAB — URINALYSIS, ROUTINE W REFLEX MICROSCOPIC
BILIRUBIN URINE: NEGATIVE
Glucose, UA: NEGATIVE mg/dL
HGB URINE DIPSTICK: NEGATIVE
Ketones, ur: NEGATIVE mg/dL
Nitrite: NEGATIVE
PH: 5.5 (ref 5.0–8.0)
Protein, ur: NEGATIVE mg/dL
SPECIFIC GRAVITY, URINE: 1.014 (ref 1.005–1.030)

## 2016-02-26 LAB — PROTIME-INR
INR: 3 — ABNORMAL HIGH (ref 0.00–1.49)
Prothrombin Time: 30.6 seconds — ABNORMAL HIGH (ref 11.6–15.2)

## 2016-02-26 LAB — BASIC METABOLIC PANEL
ANION GAP: 12 (ref 5–15)
BUN: 32 mg/dL — AB (ref 6–20)
CALCIUM: 9.1 mg/dL (ref 8.9–10.3)
CO2: 26 mmol/L (ref 22–32)
Chloride: 100 mmol/L — ABNORMAL LOW (ref 101–111)
Creatinine, Ser: 1.65 mg/dL — ABNORMAL HIGH (ref 0.61–1.24)
GFR calc Af Amer: 48 mL/min — ABNORMAL LOW (ref >60)
GFR calc non Af Amer: 41 mL/min — ABNORMAL LOW (ref >60)
GLUCOSE: 85 mg/dL (ref 65–99)
Potassium: 5.4 mmol/L — ABNORMAL HIGH (ref 3.5–5.1)
Sodium: 138 mmol/L (ref 135–145)

## 2016-02-26 LAB — HEPATIC FUNCTION PANEL
ALBUMIN: 3.8 g/dL (ref 3.5–5.0)
ALK PHOS: 126 U/L (ref 38–126)
ALT: 18 U/L (ref 17–63)
AST: 27 U/L (ref 15–41)
BILIRUBIN TOTAL: 0.9 mg/dL (ref 0.3–1.2)
Bilirubin, Direct: 0.2 mg/dL (ref 0.1–0.5)
Indirect Bilirubin: 0.7 mg/dL (ref 0.3–0.9)
Total Protein: 8.2 g/dL — ABNORMAL HIGH (ref 6.5–8.1)

## 2016-02-26 LAB — I-STAT TROPONIN, ED: Troponin i, poc: 0.01 ng/mL (ref 0.00–0.08)

## 2016-02-26 LAB — MAGNESIUM: Magnesium: 1.9 mg/dL (ref 1.7–2.4)

## 2016-02-26 LAB — CBG MONITORING, ED
GLUCOSE-CAPILLARY: 85 mg/dL (ref 65–99)
Glucose-Capillary: 67 mg/dL (ref 65–99)

## 2016-02-26 MED ORDER — SODIUM CHLORIDE 0.9% FLUSH
3.0000 mL | Freq: Two times a day (BID) | INTRAVENOUS | Status: DC
  Administered 2016-02-27 – 2016-02-29 (×3): 3 mL via INTRAVENOUS

## 2016-02-26 MED ORDER — SODIUM CHLORIDE 0.9 % IV SOLN
INTRAVENOUS | Status: DC
  Administered 2016-02-26: 23:00:00 via INTRAVENOUS

## 2016-02-26 MED ORDER — PHENYTOIN SODIUM EXTENDED 100 MG PO CAPS: 100.0000 mg | ORAL_CAPSULE | Freq: Three times a day (TID) | ORAL | Status: DC

## 2016-02-26 MED ORDER — HEPARIN SODIUM (PORCINE) 5000 UNIT/ML IJ SOLN: 5000.0000 [IU] | Freq: Three times a day (TID) | INTRAMUSCULAR | Status: DC

## 2016-02-26 MED ORDER — SODIUM CHLORIDE 0.9 % IV BOLUS (SEPSIS)
500.0000 mL | Freq: Once | INTRAVENOUS | Status: AC
  Administered 2016-02-26: 500 mL via INTRAVENOUS

## 2016-02-26 MED ORDER — SODIUM CHLORIDE 0.9% FLUSH: 3.0000 mL | INTRAVENOUS | Status: DC | PRN

## 2016-02-26 MED ORDER — SODIUM CHLORIDE 0.9 % IV SOLN: 250.0000 mL | INTRAVENOUS | Status: DC | PRN

## 2016-02-26 MED ORDER — ASPIRIN EC 81 MG PO TBEC
81.0000 mg | DELAYED_RELEASE_TABLET | Freq: Every day | ORAL | Status: DC
  Administered 2016-02-27 – 2016-02-29 (×3): 81 mg via ORAL
  Filled 2016-02-26 (×3): qty 1

## 2016-02-26 MED ORDER — AMIODARONE HCL 200 MG PO TABS
200.0000 mg | ORAL_TABLET | Freq: Every day | ORAL | Status: DC
  Administered 2016-02-27: 200 mg via ORAL
  Filled 2016-02-26: qty 1

## 2016-02-26 MED ORDER — ACETAMINOPHEN 325 MG PO TABS
650.0000 mg | ORAL_TABLET | ORAL | Status: DC | PRN
Start: 2016-02-26 — End: 2016-02-26

## 2016-02-26 MED ORDER — ACETAMINOPHEN 325 MG PO TABS: 650.0000 mg | ORAL_TABLET | Freq: Four times a day (QID) | ORAL | Status: DC | PRN

## 2016-02-26 MED ORDER — ONDANSETRON HCL 4 MG/2ML IJ SOLN: 4.0000 mg | Freq: Four times a day (QID) | INTRAMUSCULAR | Status: DC | PRN

## 2016-02-26 MED ORDER — WARFARIN - PHARMACIST DOSING INPATIENT
Freq: Every day | Status: DC
  Administered 2016-02-28: 1

## 2016-02-26 MED ORDER — CARVEDILOL 3.125 MG PO TABS
3.1250 mg | ORAL_TABLET | Freq: Two times a day (BID) | ORAL | Status: DC
  Administered 2016-02-27 (×2): 3.125 mg via ORAL
  Filled 2016-02-26 (×2): qty 1

## 2016-02-26 NOTE — ED Notes (Signed)
Notified Dr. Regenia Skeeter of CBG 4. Provided [place] sandwich, apple sauce, milk, orange juice to patient - per physician approval. Patient very appreciative.

## 2016-02-26 NOTE — ED Notes (Signed)
Patient transported to X-ray 

## 2016-02-26 NOTE — Progress Notes (Signed)
ANTICOAGULATION CONSULT NOTE - Initial Consult  Pharmacy Consult for Coumadin Indication: atrial fibrillation  No Known Allergies  Patient Measurements: Weight: 227 lb 12.8 oz (103.329 kg)  Vital Signs: Temp: 97.8 F (36.6 C) (03/05 1837) Temp Source: Oral (03/05 1837) BP: 127/88 mmHg (03/05 2245) Pulse Rate: 99 (03/05 2245)  Labs:  Recent Labs  02/26/16 1906 02/26/16 2115  HGB 15.1  --   HCT 45.3  --   PLT 137*  --   LABPROT  --  30.6*  INR  --  3.00*  CREATININE 1.65*  --     Estimated Creatinine Clearance: 53.2 mL/min (by C-G formula based on Cr of 1.65).   Medical History: Past Medical History  Diagnosis Date  . CAD (coronary artery disease)   . Chronic systolic heart failure ([place])   . Ischemic cardiomyopathy     EF 15 to 20% by TTE and TEE in 09/2012.  Severe LV dysfunction  . HTN (hypertension)   . Hyperlipidemia   . Obesity     BMI 31 in 09/2012  . Atrial fibrillation   09/22/2012  . implantable cardiac defibrillator-Biotronik     Device Implanted 2006; s/p gen change 03/2011 : bleeding persistent with pocket erosion and infection; explant and reimplant  06/2011  . Stroke ([place])   . 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 History of MRSA infection 05/2011  . Seizure disorder ([place]) latest 09/30/2012  . 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 Seizures ([facility])     Medications:  APAP  Allopurinol  Amiodarone  Coreg  Lasix  Hydralazine  Isordil  Losartan  Dilantin  Aldactone   Coumadin 4 mg daily except 2 mg Sat (last dose 3/5)  Assessment: 70 y.o. male admitted with AMS/weakness, h/o Afib, to continue Coumadin  Goal of Therapy:  INR 2-3 Monitor platelets by anticoagulation protocol: Yes   Plan:  Daily INR  Caryl Pina 02/26/2016,11:43 PM

## 2016-02-26 NOTE — ED Provider Notes (Signed)
CSN: [id number]     Arrival date & time 02/26/16  1757 History   First MD Initiated Contact with Patient 02/26/16 2016     Chief Complaint  Patient presents with  . Weakness     (Consider location/radiation/quality/duration/timing/severity/associated sxs/prior Treatment) HPI   Pt to the ER with PMH of CAD, hx of heart failure, ischemic cardiomyopathy, stroke, HTN, hyperlipidemia, obesity, a.fib, hx of SVT, seizure hx on Dilantin, factor VII deficiency comes to the ER with two episodes of near syncope. First episode was while at [facility]- he felt globally weak, diaphoretic and "not right". He denies any pain or seizure activity. No vomiting or diarrhea. He reports feeling as though he has been eating well, endorses a strong odor to his urine. EMS came and evaluated him and felt that he was safe for home. He went to church when the episode happened again and this time he decided to come to the ER. He did not feel his Biotronik fire.  He also endorses falling the other day and having a wound to his right thumb, and two to his left legs which are not hurting worse.  PCP: [name], MD [facility]. is a 70 y.o.  male  ROS: The patient denies fever, headache, weakness (focal), confusion, change of vision,  dysphagia, aphagia, shortness of breath,  abdominal pains, nausea, vomiting, diarrhea, lower extremity swelling, rash, neck pain, chest pain   Past Medical History  Diagnosis Date  . CAD (coronary artery disease)   . Chronic systolic heart failure ([place])   . Ischemic cardiomyopathy     EF 15 to 20% by TTE and TEE in 09/2012.  Severe LV dysfunction  . HTN (hypertension)   . Hyperlipidemia   . Obesity     BMI 31 in 09/2012  . Atrial fibrillation   09/22/2012  . implantable cardiac defibrillator-Biotronik     Device Implanted 2006; s/p gen change 03/2011 : bleeding persistent with pocket erosion and infection; explant and reimplant  06/2011  . Stroke ([place])   .  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 History of MRSA infection 05/2011  . Seizure disorder ([place]) latest 09/30/2012  . 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 Seizures [facility])    Past Surgical History  Procedure Laterality Date  . Coronary artery bypass graft    . Tee without cardioversion  09/24/2012    Procedure: TRANSESOPHAGEAL ECHOCARDIOGRAM (TEE);  Surgeon: [name], MD;  Location: [place];  Service: Cardiovascular;  Laterality: N/A;  dave/anesth, dl, cindy/echo   . Cardioversion  09/24/2012    Procedure: CARDIOVERSION;  Surgeon: [name], MD;  Location: [facility] ENDOSCOPY;  Service: Cardiovascular;  Laterality: N/A;  . Icd    . Cardiac catheterization N/A 10/12/2015    Procedure: Right Heart Cath;  Surgeon: [name], MD;  Location: [place] CV LAB;  Service: Cardiovascular;  Laterality: N/A;   Family History  Problem Relation Age of Onset  . Arthritis Mother   . Heart disease Mother   . Other Father     smoker  . Heart attack Mother   . Hypertension Neg Hx     unknown  . Stroke Neg Hx     unknown   Social History  Substance Use Topics  . Smoking status: Former Smoker    Types: Pipe    Quit date: 09/21/2008  . Smokeless tobacco: Former Systems developer    Quit date: 09/21/2008  . Alcohol Use: No  Review of Systems  Review of Systems All other systems negative except as documented in the HPI. All pertinent positives and negatives as reviewed in the HPI.   Allergies  Review of patient's allergies indicates no known allergies.  Home Medications   Prior to Admission medications   Medication Sig Start Date End Date Taking? Authorizing Provider  acetaminophen (TYLENOL) 325 MG tablet Take 2 tablets (650 mg total) by mouth every 6 (six) hours as needed for mild pain. 11/09/15  Yes [name], MD  allopurinol (ZYLOPRIM) 100 MG tablet Take 2 tablets (200 mg total) by mouth daily. 12/30/15  Yes [name], MD  amiodarone (PACERONE) 200 MG  tablet Take 1 tablet (200 mg total) by mouth daily. 01/10/16  Yes [name], MD  carvedilol (COREG) 3.125 MG tablet Take 1 tablet (3.125 mg total) by mouth 2 (two) times daily. 01/10/16  Yes [name], MD  furosemide (LASIX) 40 MG tablet Take 2 tablets (80 mg total) by mouth daily. 02/13/16  Yes Almyra Deforest, PA  hydrALAZINE (APRESOLINE) 50 MG tablet Take 1.5 tablets (75 mg total) by mouth 3 (three) times daily. 01/10/16  Yes [name], MD  isosorbide dinitrate (ISOCHRON) 40 MG CR tablet Take 1 tablet (40 mg total) by mouth 3 (three) times daily. 02/13/16  Yes Almyra Deforest, PA  losartan (COZAAR) 25 MG tablet Take 1 tablet (25 mg total) by mouth daily. 01/10/16  Yes [name], MD  phenytoin (DILANTIN) 100 MG ER capsule Take 1 capsule (100 mg total) by mouth 3 (three) times daily. 12/30/15  Yes [name], MD  spironolactone (ALDACTONE) 25 MG tablet Take 0.5 tablets (12.5 mg total) by mouth daily. 02/13/16  Yes Almyra Deforest, PA  warfarin (COUMADIN) 4 MG tablet Take 1 tablet (4 mg total) by mouth as directed. Patient taking differently: Take 2-4 mg by mouth daily. Take 1/2 tablet (2 mg) by mouth on Saturdays, take 1 tablet (4 mg) on Sunday thru Friday or as directed by coumadin clinic 11/28/15  Yes [name], MD   BP 127/88 mmHg  Pulse 99  Temp(Src) 97.8 F (36.6 C) (Oral)  Resp 19  Wt 103.329 kg  SpO2 93% Physical Exam  Constitutional: He appears well-developed and well-nourished. No distress.  HENT:  Head: Normocephalic and atraumatic.  Right Ear: Tympanic membrane and ear canal normal.  Left Ear: Tympanic membrane and ear canal normal.  Nose: Nose normal.  Mouth/Throat: Uvula is midline, oropharynx is clear and moist and mucous membranes are normal.  Eyes: Pupils are equal, round, and reactive to light.  Neck: Normal range of motion. Neck supple.  Cardiovascular: Normal rate and regular rhythm.   Pulmonary/Chest: Effort normal. No accessory muscle usage. No  respiratory distress.  Abdominal: Soft. Bowel sounds are normal. He exhibits no distension. There is no tenderness. There is no rigidity, no rebound and no guarding.  No signs of abdominal distention  Musculoskeletal:  No LE swelling- two small nodules that are non tender and non indurated.  Neurological: He is alert.  Acting at baseline Cranial nerves grossly intact on exam. Pt alert and oriented x 3 Upper and lower extremity strength is symmetrical and physiologic Normal muscular tone No facial droop Coordination intact, no limb ataxia,No pronator drift   Skin: Skin is warm and dry. No rash noted.  Nursing note and vitals reviewed.   ED Course  Procedures (including critical care time) Labs Review Labs Reviewed  BASIC METABOLIC PANEL - Abnormal;  Notable for the following:    Potassium 5.4 (*)    Chloride 100 (*)    BUN 32 (*)    Creatinine, Ser 1.65 (*)    GFR calc non Af Amer 41 (*)    GFR calc Af Amer 48 (*)    All other components within normal limits  CBC - Abnormal; Notable for the following:    RDW 17.7 (*)    Platelets 137 (*)    All other components within normal limits  URINALYSIS, ROUTINE W REFLEX MICROSCOPIC (NOT AT [facility]) - Abnormal; Notable for the following:    Leukocytes, UA SMALL (*)    All other components within normal limits  HEPATIC FUNCTION PANEL - Abnormal; Notable for the following:    Total Protein 8.2 (*)    All other components within normal limits  PROTIME-INR - Abnormal; Notable for the following:    Prothrombin Time 30.6 (*)    INR 3.00 (*)    All other components within normal limits  URINE MICROSCOPIC-ADD ON - Abnormal; Notable for the following:    Squamous Epithelial / LPF 0-5 (*)    Bacteria, UA RARE (*)    Casts HYALINE CASTS (*)    All other components within normal limits  MAGNESIUM  CBG MONITORING, ED  I-STAT TROPOININ, ED  CBG MONITORING, ED    Imaging Review Dg Chest 2 View  02/26/2016  CLINICAL DATA:  Weakness, fatigue,  diaphoresis. EXAM: CHEST  2 VIEW COMPARISON:  10/13/2015 FINDINGS: Postoperative changes in the mediastinum. Cardiac pacemaker. Normal heart size and pulmonary vascularity. Atelectasis in the lung bases. No focal consolidation or airspace disease. No blunting of costophrenic angles. No pneumothorax. Tortuous aorta. Degenerative changes in the spine. IMPRESSION: Atelectasis in the lung bases. Electronically Signed   By: Lucienne Capers M.D.   On: 02/26/2016 22:38   I have personally reviewed and evaluated these images and lab results as part of my medical decision-making.   EKG Interpretation   Date/Time:  Sunday February 26 2016 18:34:11 EST Ventricular Rate:  76 PR Interval:  148 QRS Duration: 116 QT Interval:  454 QTC Calculation: 510 R Axis:   136 Text Interpretation:  Sinus rhythm with Premature atrial complexes Left  posterior fascicular block Possible Inferior infarct , age undetermined  Cannot rule out Anteroseptal infarct , age undetermined ST \\T \ T wave  abnormality, consider lateral ischemia Prolonged QT Abnormal ECG ST/T  changes new compared to Oct 2016 Confirmed by Regenia Skeeter  MD, [place] [phone])  on 02/26/2016 8:29:47 PM      MDM   Final diagnoses:  Near syncope    The patient's pacemaker/defib was interrogated and showed no abnormal rhythm. UA, Troponin, CMP, pt/inr, magnesium, CBC w/dif and normal  Unremarkable chest xray. Per Dr. Regenia Skeeter, we feel the best plan to have the patient be observed overnight since he has very significant chronic disease and the episodes happened multiple times today.  Dr. Sherral Hammers to put orders in for Obs admission.  Filed Vitals:   02/26/16 2100 02/26/16 2245  BP: 117/92 127/88  Pulse: 67 99  Temp:    Resp: 13 [address], PA-C 02/26/16 2324  Sherwood Gambler, MD 02/29/16 [phone]

## 2016-02-26 NOTE — H&P (Signed)
[facility] History and Physical  [facility]. [id number] DOB: 1946/04/10 DOA: 02/26/2016   PCP: [name], MD    Chief Complaint: Near-syncope  HPI: [facility] is a 70 y.o.  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 First episode was while at [facility]- he felt globally weak, diaphoretic and "not right". He denies any pain or seizure activity. No vomiting or diarrhea. He reports feeling as though he has been eating well, endorses a strong odor to his urine. EMS came and evaluated him and felt that he was safe for home. He went to church when the episode happened again and this time he decided to come to the ER. He did not feel his Biotronik fire.   Review of Systems: The patient denies anorexia, fever, weight loss,, vision loss, decreased hearing, hoarseness, chest pain, syncope, dyspnea on exertion, peripheral edema, balance deficits, hemoptysis, abdominal pain, melena, hematochezia, severe indigestion/heartburn, hematuria, incontinence, genital sores, muscle weakness, suspicious skin lesions, transient blindness, difficulty walking, depression, unusual weight change, abnormal bleeding, enlarged lymph nodes, angioedema, and breast masses.     TRAVEL HISTORY:    Consultants:    Procedure/Significant Events:  3/6 CT head WO contrast; negative acute abnormality    Culture  NA   Antibiotics:  NA  DVT prophylaxis:  Warfarin  Devices  NA   LINES / TUBES:  NA   Past Medical History  Diagnosis Date  . CAD (coronary artery disease)   . Chronic systolic heart failure ([place])   . Ischemic cardiomyopathy     EF 15 to 20% by TTE and TEE in 09/2012.  Severe LV dysfunction  . HTN (hypertension)   . Hyperlipidemia    . Obesity     BMI 31 in 09/2012  . Atrial fibrillation   09/22/2012  . implantable cardiac defibrillator-Biotronik     Device Implanted 2006; s/p gen change 03/2011 : bleeding persistent with pocket erosion and infection; explant and reimplant  06/2011  . Stroke ([place])   . 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 History of MRSA infection 05/2011  . Seizure disorder ([place]) latest 09/30/2012  . 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 Seizures [facility])    Past Surgical History  Procedure Laterality Date  . Coronary artery bypass graft    . Tee without cardioversion  09/24/2012    Procedure: TRANSESOPHAGEAL ECHOCARDIOGRAM (TEE);  Surgeon: [name], MD;  Location: [place];  Service: Cardiovascular;  Laterality: N/A;  dave/anesth, dl, cindy/echo   . Cardioversion  09/24/2012    Procedure: CARDIOVERSION;  Surgeon: [name], MD;  Location: [facility] ENDOSCOPY;  Service: Cardiovascular;  Laterality: N/A;  . Icd    . Cardiac catheterization N/A 10/12/2015    Procedure: Right Heart Cath;  Surgeon: [name], MD;  Location: [place] CV LAB;  Service: Cardiovascular;  Laterality: N/A;   Social History:  reports that he quit smoking about 7 years ago. His smoking use included Pipe. He quit smokeless tobacco use about 7 years ago. He reports that he does not drink alcohol or use illicit drugs. where does patient live--home, ALF, SNF?  Home alone Can patient participate in ADLs yes  No Known Allergies  Family History  Problem Relation Age of Onset  . Arthritis Mother   . Heart disease Mother   .  Other Father     smoker  . Heart attack Mother   . Hypertension Neg Hx     unknown  . Stroke Neg Hx     unknown       Prior to Admission medications   Medication Sig Start Date End Date Taking? Authorizing Provider  acetaminophen (TYLENOL) 325 MG tablet Take 2 tablets (650 mg total) by mouth every 6 (six) hours as needed for mild pain. 11/09/15  Yes [name], MD   allopurinol (ZYLOPRIM) 100 MG tablet Take 2 tablets (200 mg total) by mouth daily. 12/30/15  Yes [name], MD  amiodarone (PACERONE) 200 MG tablet Take 1 tablet (200 mg total) by mouth daily. 01/10/16  Yes [name], MD  carvedilol (COREG) 3.125 MG tablet Take 1 tablet (3.125 mg total) by mouth 2 (two) times daily. 01/10/16  Yes [name], MD  furosemide (LASIX) 40 MG tablet Take 2 tablets (80 mg total) by mouth daily. 02/13/16  Yes Almyra Deforest, PA  hydrALAZINE (APRESOLINE) 50 MG tablet Take 1.5 tablets (75 mg total) by mouth 3 (three) times daily. 01/10/16  Yes [name], MD  isosorbide dinitrate (ISOCHRON) 40 MG CR tablet Take 1 tablet (40 mg total) by mouth 3 (three) times daily. 02/13/16  Yes Almyra Deforest, PA  losartan (COZAAR) 25 MG tablet Take 1 tablet (25 mg total) by mouth daily. 01/10/16  Yes [name], MD  phenytoin (DILANTIN) 100 MG ER capsule Take 1 capsule (100 mg total) by mouth 3 (three) times daily. 12/30/15  Yes [name], MD  spironolactone (ALDACTONE) 25 MG tablet Take 0.5 tablets (12.5 mg total) by mouth daily. 02/13/16  Yes Almyra Deforest, PA  warfarin (COUMADIN) 4 MG tablet Take 1 tablet (4 mg total) by mouth as directed. Patient taking differently: Take 2-4 mg by mouth daily. Take 1/2 tablet (2 mg) by mouth on Saturdays, take 1 tablet (4 mg) on Sunday thru Friday or as directed by coumadin clinic 11/28/15  Yes [name], MD   Physical Exam: Filed Vitals:   02/26/16 2015 02/26/16 2030 02/26/16 2100 02/26/16 2245  BP: 108/92 118/84 117/92 127/88  Pulse:  68 67 99  Temp:      TempSrc:      Resp: 20 17 13 19   Weight:      SpO2: 99% 100% 98% 93%     General: A/O 4, NAD, No acute respiratory distress Eyes: Negative headache, eye pain, double vision, scotomas, floaters, negative scleral hemorrhage ENT: Negative Runny nose, negative ear pain, negative tinnitus, negative gingival bleeding, negative odynophagia Neck:  Negative scars,  masses, torticollis, lymphadenopathy, JVD Lungs: Clear to auscultation bilaterally without wheezes or crackles Cardiovascular: Irregular irregular rhythm and rate, without murmur gallop or rub normal S1 and S2 Abdomen:negative abdominal pain, negative dysphagia, Nontender, nondistended, soft, bowel sounds positive, no rebound, no ascites, no appreciable mass Extremities: No significant cyanosis, clubbing, or edema bilateral lower extremities, laceration on left lateral calf patient unsure how it occurred (appears ~34 days old) patient does not remember falling or injuring leg. Psychiatric:  Negative depression, negative anxiety, negative fatigue, negative mania  Neurologic:  Cranial nerves II through XII intact, tongue/uvula midline, all extremities muscle strength 5/5, sensation intact throughout, positive mild dysarthria, negative expressive aphasia, negative receptive aphasia.      Labs on Admission:  Basic Metabolic Panel:  Recent Labs Lab 02/26/16 1906 02/26/16 2115  NA 138  --   K 5.4*  --  CL 100*  --   CO2 26  --   GLUCOSE 85  --   BUN 32*  --   CREATININE 1.65*  --   CALCIUM 9.1  --   MG  --  1.9   Liver Function Tests:  Recent Labs Lab 02/26/16 2115  AST 27  ALT 18  ALKPHOS 126  BILITOT 0.9  PROT 8.2*  ALBUMIN 3.8   No results for input(s): LIPASE, AMYLASE in the last 168 hours. No results for input(s): AMMONIA in the last 168 hours. CBC:  Recent Labs Lab 02/26/16 1906  WBC 4.7  HGB 15.1  HCT 45.3  MCV 87.1  PLT 137*   Cardiac Enzymes: No results for input(s): CKTOTAL, CKMB, CKMBINDEX, TROPONINI in the last 168 hours.  BNP (last 3 results)  Recent Labs  10/04/15 1604 10/31/15 1551 12/01/15 1015  BNP 796.9* 53.9 70.1    ProBNP (last 3 results) No results for input(s): PROBNP in the last 8760 hours.  CBG:  Recent Labs Lab 02/26/16 1835 02/26/16 2252  GLUCAP 85 67    Radiological Exams on Admission: Dg Chest 2 View  02/26/2016   CLINICAL DATA:  Weakness, fatigue, diaphoresis. EXAM: CHEST  2 VIEW COMPARISON:  10/13/2015 FINDINGS: Postoperative changes in the mediastinum. Cardiac pacemaker. Normal heart size and pulmonary vascularity. Atelectasis in the lung bases. No focal consolidation or airspace disease. No blunting of costophrenic angles. No pneumothorax. Tortuous aorta. Degenerative changes in the spine. IMPRESSION: Atelectasis in the lung bases. Electronically Signed   By: Lucienne Capers M.D.   On: 02/26/2016 22:38    EKG: Pending  Assessment/Plan Principal Problem:   Near syncope Active Problems:   HTN (hypertension)   Ischemic cardiomyopathy  ? additional rate component   Automatic implantable cardioverter-defibrillator in situ   Seizure (HCC)   Factor VII deficiency (HCC)   Chronic systolic heart failure (HCC)   Afib (HCC)   Essential hypertension   HLD (hyperlipidemia)   CVA (cerebral infarction)   Near-syncope -Echocardiogram -Troponin 3 -Orthostatic vitals -TSH -CT head  Ischemic cardiomyopathy/chronic systolic CHF -See near-syncope  HTN -See near-syncope  Chronic A. Fib -Rate controlled, therapeutic on Coumadin -Continue Coumadin per pharmacy  HLD -Lipid panel pending  Seizure -Dilantin 100 mg TID -Dilantin level pending -Consider EEG    Goals of care -Discussed the difference between DO NOT RESUSCITATE  Vs full code patient states he has discussed this with his pastor and has inform staff in the past that he is DO NOT RESUSCITATE. Will change status in computer.      Code Status: DO NOT RESUSCITATE Family Communication: None Disposition Plan: Completion of syncope and collapse workup   Care during the described time interval was provided by me .  I have reviewed this patient's available data, including medical history, events of note, physical examination, and all test results as part of my evaluation. I have personally reviewed and interpreted all radiology  studies.    Time spent: 55 minutes  Allie Bossier [facility] Pager [phone]  If 7PM-7AM, please contact night-coverage [email or web address] Password [facility] 02/26/2016, 11:29 PM

## 2016-02-26 NOTE — ED Notes (Signed)
Pacemaker interrogation in process at this time.

## 2016-02-26 NOTE — ED Notes (Signed)
Pt reports was eating at restaurant today, felt like he needed to lay head down on table, pt was aware during this episode, someone at his table advised his hand was shaking. , EMS was called pt didn't want to be transported.  This happened again when he got to church.  Pt reports he just felt weak during this time.  No chest pain, shortness of breath, nausea or any other s/s noted.

## 2016-02-27 ENCOUNTER — Observation Stay (HOSPITAL_COMMUNITY): Payer: Medicare Other

## 2016-02-27 ENCOUNTER — Encounter (HOSPITAL_COMMUNITY): Payer: Self-pay | Admitting: Radiology

## 2016-02-27 ENCOUNTER — Telehealth: Payer: Self-pay | Admitting: Internal Medicine

## 2016-02-27 ENCOUNTER — Observation Stay (HOSPITAL_BASED_OUTPATIENT_CLINIC_OR_DEPARTMENT_OTHER): Payer: Medicare Other

## 2016-02-27 DIAGNOSIS — I5022 Chronic systolic (congestive) heart failure: Secondary | ICD-10-CM

## 2016-02-27 DIAGNOSIS — I255 Ischemic cardiomyopathy: Secondary | ICD-10-CM | POA: Diagnosis not present

## 2016-02-27 DIAGNOSIS — I638 Other cerebral infarction: Secondary | ICD-10-CM | POA: Diagnosis not present

## 2016-02-27 DIAGNOSIS — I482 Chronic atrial fibrillation: Secondary | ICD-10-CM

## 2016-02-27 DIAGNOSIS — R55 Syncope and collapse: Secondary | ICD-10-CM

## 2016-02-27 DIAGNOSIS — I5032 Chronic diastolic (congestive) heart failure: Secondary | ICD-10-CM | POA: Diagnosis not present

## 2016-02-27 DIAGNOSIS — I959 Hypotension, unspecified: Secondary | ICD-10-CM | POA: Diagnosis not present

## 2016-02-27 DIAGNOSIS — D682 Hereditary deficiency of other clotting factors: Secondary | ICD-10-CM

## 2016-02-27 DIAGNOSIS — I480 Paroxysmal atrial fibrillation: Secondary | ICD-10-CM | POA: Diagnosis not present

## 2016-02-27 DIAGNOSIS — R569 Unspecified convulsions: Secondary | ICD-10-CM

## 2016-02-27 DIAGNOSIS — E785 Hyperlipidemia, unspecified: Secondary | ICD-10-CM

## 2016-02-27 DIAGNOSIS — I10 Essential (primary) hypertension: Secondary | ICD-10-CM

## 2016-02-27 LAB — LIPID PANEL
CHOL/HDL RATIO: 4.6 ratio
CHOLESTEROL: 228 mg/dL — AB (ref 0–200)
HDL: 50 mg/dL (ref >40)
LDL Cholesterol: 162 mg/dL — ABNORMAL HIGH (ref 0–99)
Triglycerides: 79 mg/dL (ref <150)
VLDL: 16 mg/dL (ref 0–40)

## 2016-02-27 LAB — CREATININE, SERUM
Creatinine, Ser: 1.55 mg/dL — ABNORMAL HIGH (ref 0.61–1.24)
GFR calc non Af Amer: 45 mL/min — ABNORMAL LOW (ref >60)
GFR, EST AFRICAN AMERICAN: 52 mL/min — AB (ref >60)

## 2016-02-27 LAB — URINALYSIS, ROUTINE W REFLEX MICROSCOPIC
Bilirubin Urine: NEGATIVE
GLUCOSE, UA: NEGATIVE mg/dL
HGB URINE DIPSTICK: NEGATIVE
Ketones, ur: NEGATIVE mg/dL
Leukocytes, UA: NEGATIVE
Nitrite: NEGATIVE
PH: 5.5 (ref 5.0–8.0)
Protein, ur: NEGATIVE mg/dL
SPECIFIC GRAVITY, URINE: 1.012 (ref 1.005–1.030)

## 2016-02-27 LAB — COMPREHENSIVE METABOLIC PANEL
ALBUMIN: 3.3 g/dL — AB (ref 3.5–5.0)
ALT: 16 U/L — ABNORMAL LOW (ref 17–63)
ANION GAP: 9 (ref 5–15)
AST: 26 U/L (ref 15–41)
Alkaline Phosphatase: 117 U/L (ref 38–126)
BILIRUBIN TOTAL: 0.6 mg/dL (ref 0.3–1.2)
BUN: 32 mg/dL — AB (ref 6–20)
CHLORIDE: 104 mmol/L (ref 101–111)
CO2: 27 mmol/L (ref 22–32)
Calcium: 8.5 mg/dL — ABNORMAL LOW (ref 8.9–10.3)
Creatinine, Ser: 1.52 mg/dL — ABNORMAL HIGH (ref 0.61–1.24)
GFR calc Af Amer: 53 mL/min — ABNORMAL LOW (ref >60)
GFR calc non Af Amer: 46 mL/min — ABNORMAL LOW (ref >60)
GLUCOSE: 122 mg/dL — AB (ref 65–99)
POTASSIUM: 4 mmol/L (ref 3.5–5.1)
SODIUM: 140 mmol/L (ref 135–145)
TOTAL PROTEIN: 6.7 g/dL (ref 6.5–8.1)

## 2016-02-27 LAB — CBC
HEMATOCRIT: 42.9 % (ref 39.0–52.0)
HEMOGLOBIN: 14 g/dL (ref 13.0–17.0)
MCH: 28.7 pg (ref 26.0–34.0)
MCHC: 32.6 g/dL (ref 30.0–36.0)
MCV: 87.9 fL (ref 78.0–100.0)
Platelets: 138 10*3/uL — ABNORMAL LOW (ref 150–400)
RBC: 4.88 MIL/uL (ref 4.22–5.81)
RDW: 17.6 % — AB (ref 11.5–15.5)
WBC: 4.6 10*3/uL (ref 4.0–10.5)

## 2016-02-27 LAB — MAGNESIUM: Magnesium: 1.8 mg/dL (ref 1.7–2.4)

## 2016-02-27 LAB — MRSA PCR SCREENING: MRSA BY PCR: NEGATIVE

## 2016-02-27 LAB — TROPONIN I
Troponin I: <0.03 ng/mL (ref <0.031)
Troponin I: <0.03 ng/mL (ref <0.031)
Troponin I: <0.03 ng/mL (ref <0.031)

## 2016-02-27 LAB — T4, FREE: FREE T4: 0.79 ng/dL (ref 0.61–1.12)

## 2016-02-27 LAB — CBG MONITORING, ED: GLUCOSE-CAPILLARY: 118 mg/dL — AB (ref 65–99)

## 2016-02-27 LAB — TSH: TSH: 4.689 u[IU]/mL — AB (ref 0.350–4.500)

## 2016-02-27 LAB — PROTIME-INR
INR: 3.48 — AB (ref 0.00–1.49)
Prothrombin Time: 34.3 seconds — ABNORMAL HIGH (ref 11.6–15.2)

## 2016-02-27 LAB — PHENYTOIN LEVEL, TOTAL: Phenytoin Lvl: 32.5 ug/mL (ref 10.0–20.0)

## 2016-02-27 MED ORDER — FUROSEMIDE 40 MG PO TABS
40.0000 mg | ORAL_TABLET | Freq: Every day | ORAL | Status: DC
  Administered 2016-02-27 – 2016-02-29 (×3): 40 mg via ORAL
  Filled 2016-02-27 (×3): qty 1

## 2016-02-27 MED ORDER — MAGNESIUM OXIDE 400 (241.3 MG) MG PO TABS
200.0000 mg | ORAL_TABLET | Freq: Two times a day (BID) | ORAL | Status: DC
  Administered 2016-02-27 – 2016-02-29 (×5): 200 mg via ORAL
  Filled 2016-02-27 (×5): qty 1

## 2016-02-27 MED ORDER — AMIODARONE HCL 200 MG PO TABS
200.0000 mg | ORAL_TABLET | Freq: Two times a day (BID) | ORAL | Status: DC
  Administered 2016-02-27 – 2016-02-29 (×4): 200 mg via ORAL
  Filled 2016-02-27 (×4): qty 1

## 2016-02-27 MED ORDER — PHENYTOIN SODIUM EXTENDED 100 MG PO CAPS
100.0000 mg | ORAL_CAPSULE | Freq: Three times a day (TID) | ORAL | Status: DC
  Administered 2016-02-27: 100 mg via ORAL
  Filled 2016-02-27: qty 1

## 2016-02-27 MED ORDER — INFLUENZA VAC SPLIT QUAD 0.5 ML IM SUSY
0.5000 mL | PREFILLED_SYRINGE | INTRAMUSCULAR | Status: AC
  Administered 2016-02-28: 0.5 mL via INTRAMUSCULAR
  Filled 2016-02-27: qty 0.5

## 2016-02-27 MED ORDER — CARVEDILOL 3.125 MG PO TABS
3.1250 mg | ORAL_TABLET | Freq: Two times a day (BID) | ORAL | Status: DC
  Administered 2016-02-28 – 2016-02-29 (×3): 3.125 mg via ORAL
  Filled 2016-02-27 (×3): qty 1

## 2016-02-27 NOTE — Telephone Encounter (Signed)
Rec'd from Dr. Lindwood Qua forward 18 pages to Dr. Sharlet Salina

## 2016-02-27 NOTE — Consult Note (Signed)
   [facility] CM Inpatient Consult   02/27/2016  [facility] 07-Jan-1946 [id number] Patient was assessed for [facility] [facility] for community services. Patient was previously active with [place] [facility].  Met with patient at bedside regarding being restarted with [facility] services. Patient states he is having difficulty with his cell phone.  He consents to ongoing [place] [facility] services.  Consent form signed previously and up-to-date information noted.  He endorses that he is using [employer] for transportation.  He continues to state that [name] [phone] is his best contact person.  Doristine Bosworth Smith's son also lives with the patient. Patient will receive post hospital follow up calls.  Of note, [facility] Care Management services does not replace or interfere with any services that are arranged by inpatient case management or social work. For additional questions or referrals please contact: Natividad Brood, RN BSN [facility] Liaison  [phone] business mobile phone Toll free office [phone]

## 2016-02-27 NOTE — Progress Notes (Signed)
[facility] PROGRESS NOTE  [facility] [id number] DOB: 04/11/1946 DOA: 02/26/2016 PCP: [name], MD  Assessment/Plan: [facility] Bonito. is a 70 y.o. 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 First episode was while at [facility]- Louis Ford felt globally weak, diaphoretic and "not right". Louis Ford denies any pain or seizure activity. No vomiting or diarrhea. Louis Ford reports feeling as though Louis Ford has been eating well, endorses a strong odor to his urine. EMS came and evaluated him and felt that Louis Ford was safe for home. Louis Ford went to church when the episode happened again and this time Louis Ford decided to come to the ER. Louis Ford did not feel his Biotronik fire.  1-Near Syncope:  Suspect vaso-vagal vs related to dehydration , hypotension.  ICD will be reviewed by cardiology.  Appreciate Heart failure team help/   Ischemic cardiomyopathy/chronic systolic CHF -will decrease home dose lasix to 40 mg daily.  -IV fluids stopped.  -HF team will follow in consultation.   HTN SBP soft, hold for now: hydralazine, isosorbide, cozaar and spironolactone.   Chronic A. Fib -Rate controlled, therapeutic on Coumadin -Continue Coumadin per pharmacy continue with coreg.   HLD -Lipid panel pending  Seizure -On Dilantin 100 mg TID. Will hold dilantin for now, level elevated.  -Dilantin level elevated at 32. Pharmacy will help with dilantin doses.   Mild elevated TSH. Await Free T 3 level. Free T 4 normal.   Code Status: DNR Family Communication: care discussed  Disposition Plan: Remain inpatient.    Consultants:  Cardiology   Procedures:  echo  Antibiotics:  none  HPI/Subjective: Louis Ford is feeling better, no more dizziness.    Objective: Filed Vitals:   02/27/16  0635 02/27/16 1140  BP: 107/77 99/81  Pulse: 69 71  Temp: 98.3 F (36.8 C)   Resp: 18 18    Intake/Output Summary (Last 24 hours) at 02/27/16 1723 Last data filed at 02/27/16 1219  Gross per 24 hour  Intake   2035 ml  Output    525 ml  Net   1510 ml   Filed Weights   02/26/16 1837 02/27/16 0126  Weight: 103.329 kg (227 lb 12.8 oz) 101.651 kg (224 lb 1.6 oz)    Exam:   General:  Alert in no acute distress  Cardiovascular: S 1, S 2 RRR  Respiratory: CTA  Abdomen: Bs present, soft, nt  Musculoskeletal: no edema  Data Reviewed: Basic Metabolic Panel:  Recent Labs Lab 02/26/16 1906 02/26/16 2115 02/27/16 0134 02/27/16 0511  NA 138  --   --  140  K 5.4*  --   --  4.0  CL 100*  --   --  104  CO2 26  --   --  27  GLUCOSE 85  --   --  122*  BUN 32*  --   --  32*  CREATININE 1.65*  --  1.55* 1.52*  CALCIUM 9.1  --   --  8.5*  MG  --  1.9 1.8  --    Liver Function Tests:  Recent Labs Lab 02/26/16 2115 02/27/16 0511  AST 27 26  ALT 18 16*  ALKPHOS 126 117  BILITOT 0.9 0.6  PROT 8.2* 6.7  ALBUMIN 3.8 3.3*   No results for input(s): LIPASE, AMYLASE in the last 168 hours. No results for input(s):  AMMONIA in the last 168 hours. CBC:  Recent Labs Lab 02/26/16 1906 02/27/16 0134  WBC 4.7 4.6  HGB 15.1 14.0  HCT 45.3 42.9  MCV 87.1 87.9  PLT 137* 138*   Cardiac Enzymes:  Recent Labs Lab 02/27/16 0134 02/27/16 0511 02/27/16 0942  TROPONINI <0.03 <0.03 <0.03   BNP (last 3 results)  Recent Labs  10/04/15 1604 10/31/15 1551 12/01/15 1015  BNP 796.9* 53.9 70.1    ProBNP (last 3 results) No results for input(s): PROBNP in the last 8760 hours.  CBG:  Recent Labs Lab 02/26/16 1835 02/26/16 2252 02/27/16 0001  GLUCAP 85 67 118*    Recent Results (from the past 240 hour(s))  MRSA PCR Screening     Status: None   Collection Time: 02/27/16  2:29 AM  Result Value Ref Range Status   MRSA by PCR NEGATIVE NEGATIVE Final    Comment:         The GeneXpert MRSA Assay (FDA approved for NASAL specimens only), is one component of a comprehensive MRSA colonization surveillance program. It is not intended to diagnose MRSA infection nor to guide or monitor treatment for MRSA infections.      Studies: Dg Chest 2 View  02/26/2016  CLINICAL DATA:  Weakness, fatigue, diaphoresis. EXAM: CHEST  2 VIEW COMPARISON:  10/13/2015 FINDINGS: Postoperative changes in the mediastinum. Cardiac pacemaker. Normal heart size and pulmonary vascularity. Atelectasis in the lung bases. No focal consolidation or airspace disease. No blunting of costophrenic angles. No pneumothorax. Tortuous aorta. Degenerative changes in the spine. IMPRESSION: Atelectasis in the lung bases. Electronically Signed   By: Lucienne Capers M.D.   On: 02/26/2016 22:38   Ct Head Wo Contrast  02/27/2016  CLINICAL DATA:  Weakness and near syncope. EXAM: CT HEAD WITHOUT CONTRAST TECHNIQUE: Contiguous axial images were obtained from the base of the skull through the vertex without intravenous contrast. COMPARISON:  09/26/2015 FINDINGS: Mild cerebral atrophy. Mild ventricular dilatation consistent with central atrophy. Patchy low-attenuation changes in the deep white matter consistent with small vessel ischemia. No mass effect or midline shift. No abnormal extra-axial fluid collections. Gray-white matter junctions are distinct. Basal cisterns are not effaced. No evidence of acute intracranial hemorrhage. No depressed skull fractures. Opacification of some of the right mastoid air cells. Mild mucosal thickening in the paranasal sinuses. Vascular calcifications. IMPRESSION: No acute intracranial abnormalities. Mild chronic atrophy and small vessel ischemic changes similar to previous study. Electronically Signed   By: Lucienne Capers M.D.   On: 02/27/2016 01:21    Scheduled Meds: . amiodarone  200 mg Oral BID  . aspirin EC  81 mg Oral Daily  . carvedilol  3.125 mg Oral BID WC  .  furosemide  40 mg Oral Daily  . [START ON 02/28/2016] Influenza vac split quadrivalent PF  0.5 mL Intramuscular Tomorrow-1000  . magnesium oxide  200 mg Oral BID  . sodium chloride flush  3 mL Intravenous Q12H  . Warfarin - Pharmacist Dosing Inpatient   Does not apply q1800   Continuous Infusions:   Principal Problem:   Near syncope Active Problems:   HTN (hypertension)   Ischemic cardiomyopathy  ? additional rate component   Automatic implantable cardioverter-defibrillator in situ   Seizure (HCC)   Factor VII deficiency ([place])   Chronic systolic heart failure (HCC)   Afib (HCC)   Essential hypertension   HLD (hyperlipidemia)   CVA (cerebral infarction)    Time spent: 25 minutes.     Ernesteen Mihalic A  [facility] Pager [phone]. If 7PM-7AM, please contact night-coverage at [email or web address], password [email or web address] 02/27/2016, 5:23 PM

## 2016-02-27 NOTE — Progress Notes (Signed)
Pt takes Dilantin 3 xday for seizures, thanks Arvella Nigh RN

## 2016-02-27 NOTE — Care Management Obs Status (Signed)
[place] NOTIFICATION   Patient Details  Name: [facility] MRN: [id number] Date of Birth: 06/01/1946   Medicare Observation Status Notification Given:  Yes  Patient refused to sign.   Royston Bake, RN 02/27/2016, 3:56 PM

## 2016-02-27 NOTE — Evaluation (Signed)
Physical Therapy Evaluation Patient Details Name: Louis Ford. MRN: [id number] DOB: [birth year] Today's Date: 02/27/2016   History of Present Illness  [facility]. is a 70 y.o. BM PMHx Chronic Systolic CHF/Ischemic Cardiomyopathy, S/P implantable cardiac defibrillator-Biotronik, Atrial Fibrillation S/P Cardioversion 09/2012, HTN, HLD, CVA, Seizure DO, Factor VII deficiency, Hx MRSA infection. Admitted for near-syncope.  Clinical Impression  Suspect pt functioning near baseline. Pt resistive to any recommendation to improve pt safety during mobility. Pt at increased falls risk but refused use of RW or fitting his cane to optimal support. Pt to benefit from HHPT to address mentioned deficits however suspect he will defer that as well. Pt does require 24/7 supervision for safe d/c home at he is at increased falls risk.    Follow Up Recommendations Home health PT;Supervision/Assistance - 24 hour (pt reports "i'll think about it",)    Equipment Recommendations  None recommended by PT (has cane and RW)    Recommendations for Other Services       Precautions / Restrictions Precautions Precautions: Fall Restrictions Weight Bearing Restrictions: No      Mobility  Bed Mobility Overal bed mobility: Modified Independent             General bed mobility comments: HOB elevated, pt reports "don't make it flat, I've been this way since i was little. I sleep with a lot of pillows under my head  Transfers Overall transfer level: Modified independent Equipment used: Straight cane             General transfer comment: safe use of hands  Ambulation/Gait Ambulation/Gait assistance: Min guard Ambulation Distance (Feet): 75 Feet Assistive device: Straight cane Gait Pattern/deviations: Step-through pattern;Decreased stride length;Wide base of support;Trunk flexed Gait velocity: slow Gait velocity interpretation: Below normal speed for age/gender General Gait Details: pt with L  LE limp and severe trunk flexion, pt can achieve upright posture but not maintain it. Attempted to fit patients cane for optimal fitting however pt refused. Suspect pt would be safer with RW however pt refused to try and reports "i do just fine with my cane"  Stairs            Wheelchair Mobility    Modified Rankin (Stroke Patients Only)       Balance Overall balance assessment: Needs assistance         Standing balance support: Single extremity supported Standing balance-Leahy Scale: Poor Standing balance comment: requires use of cane                             Pertinent Vitals/Pain Pain Assessment: No/denies pain    Home Living Family/patient expects to be discharged to:: Private residence Living Arrangements: Other (Comment) (lives with pastors son) Available Help at Discharge: Friend(s);Available PRN/intermittently Type of Home: Apartment Home Access: Level entry     Home Layout: One level Home Equipment: Walker - 2 wheels;Cane - single point      Prior Function Level of Independence: Needs assistance   Gait / Transfers Assistance Needed: pt amb with cane  ADL's / Homemaking Assistance Needed: indep  Comments: roomate does the driving, does grocery shopping together but just can't drive     Hand Dominance   Dominant Hand: Right    Extremity/Trunk Assessment   Upper Extremity Assessment: Generalized weakness           Lower Extremity Assessment: Generalized weakness      Cervical / Trunk Assessment: Kyphotic (difficult  to maintain back extension)  Communication   Communication: No difficulties  Cognition Arousal/Alertness: Awake/alert Behavior During Therapy: Flat affect Overall Cognitive Status: Impaired/Different from baseline Area of Impairment: Safety/judgement         Safety/Judgement: Decreased awareness of safety;Decreased awareness of deficits          General Comments      Exercises         Assessment/Plan    PT Assessment Patient needs continued PT services  PT Diagnosis Difficulty walking;Generalized weakness   PT Problem List Decreased strength;Decreased range of motion;Decreased activity tolerance;Decreased balance;Decreased mobility  PT Treatment Interventions     PT Goals (Current goals can be found in the Care Plan section) Acute Rehab PT Goals Patient Stated Goal: home PT Goal Formulation: With patient Time For Goal Achievement: 03/12/16 Potential to Achieve Goals: Good Additional Goals Additional Goal #1: Pt to score >19 on DGI to indicate minimal falls risk.    Frequency Min 3X/week   Barriers to discharge Decreased caregiver support      Co-evaluation               End of Session Equipment Utilized During Treatment: Gait belt Activity Tolerance: Patient tolerated treatment well Patient left: in bed;with call bell/phone within reach;with bed alarm set (pt refused to sit in chair, desired to sit EOB) Nurse Communication: Mobility status    Functional Assessment Tool Used: clinical judgement Functional Limitation: Mobility: Walking and moving around Mobility: Walking and Moving Around Current Status [id number]): At least 1 percent but less than 20 percent impaired, limited or restricted Mobility: Walking and Moving Around Goal Status [phone]): At least 1 percent but less than 20 percent impaired, limited or restricted    Time: 1217-1235 PT Time Calculation (min) (ACUTE ONLY): 18 min   Charges:   PT Evaluation $PT Eval Moderate Complexity: 1 Procedure     PT G Codes:   PT G-Codes **NOT FOR INPATIENT CLASS** Functional Assessment Tool Used: clinical judgement Functional Limitation: Mobility: Walking and moving around Mobility: Walking and Moving Around Current Status [id number]): At least 1 percent but less than 20 percent impaired, limited or restricted Mobility: Walking and Moving Around Goal Status [phone]): At least 1 percent but less than 20  percent impaired, limited or restricted    Kingsley Callander 02/27/2016, 2:43 PM   Kittie Plater, PT, DPT Pager #: [phone] Office #: [phone]

## 2016-02-27 NOTE — Progress Notes (Signed)
Advanced Heart Failure Team Consult Note  Referring Physician: Dr. Tyrell Antonio Primary Care: Dr Pricilla Holm Primary Cardiologist: Dr Lovena Le Primary HF: Dr. Haroldine Laws  Reason for Consultation: Near syncope, HF patient with complicated hx.  HPI:    Louis Ford. is a 70 y.o. male with hx of CAD s/p CABG, ICM s/p Biotronik ICD, chronic systolic CHF Echo [id number] LVEF 30-35%; normal RV; PA peak pressure 34 mm Hg, HTN, HLD, paroxysmal atrial fibrillation, previous stroke, Factor 7 deficiency, and seizure disorder followed in the HF clinic.  He presented to [facility] 02/26/16 after 2 "near syncopal episodes". One at [employer] ( lunch after church) and once during the evening service. He states he felt weak, diaphoretic and just "not right". Denies CP or seizure. No ABD pain or vomiting. Denies ICD shocks.  Pertinent labs on admission include K 5.4, Creatinine 1.65, Troponin flat/negative, Hgb normal. INR 3.0 on admit. 3.48 today.   He denies any recent illnesses.  Takes all of his medication as directed. Lasix makes him pee really well still. He states he was in his usual state of health up until yesterday and can think of no specific triggering factors.  Of note, he is actually down > 10 lbs since the last time we saw him in clinic. He has had no other lightheadedness or dizziness leading up to yesterday.  Did not miss any medications.  He does not watch his salt or fluid in take as well as he should, he states. He states he did urinate on himself twice several weeks ago and has a strong smell in his urine, though UA was negative yesterday.    Review of Systems: [y] = yes, [ ]  = no   General: Weight gain [ ] ; Weight loss [ ] ; Anorexia [ ] ; Fatigue [ ] ; Fever [ ] ; Chills [ ] ; Weakness [y]  Cardiac: Chest pain/pressure [ ] ; Resting SOB [ ] ; Exertional SOB [ ] ; Orthopnea [ ] ; Pedal Edema [ ] ; Palpitations [ ] ; Syncope [ ] ; Presyncope [y]; Paroxysmal nocturnal dyspnea[ ]   Pulmonary: Cough [ ] ;  Wheezing[ ] ; Hemoptysis[ ] ; Sputum [ ] ; Snoring [ ]   GI: Vomiting[ ] ; Dysphagia[ ] ; Melena[ ] ; Hematochezia [ ] ; Heartburn[ ] ; Abdominal pain [ ] ; Constipation [ ] ; Diarrhea [ ] ; BRBPR [ ]   GU: Hematuria[ ] ; Dysuria [ ] ; Nocturia[ ]   Vascular: Pain in legs with walking [ ] ; Pain in feet with lying flat [ ] ; Non-healing sores [ ] ; Stroke [ ] ; TIA [ ] ; Slurred speech [ ] ;  Neuro: Headaches[ ] ; Vertigo[ ] ; Seizures[ ] ; Paresthesias[ ] ;Blurred vision [ ] ; Diplopia [ ] ; Vision changes [ ]   Ortho/Skin: Arthritis [y]; Joint pain [y]; Muscle pain [ ] ; Joint swelling [ ] ; Back Pain [ ] ; Rash [ ]   Psych: Depression[ ] ; Anxiety[ ]   Heme: Bleeding problems [ ] ; Clotting disorders [ ] ; Anemia [ ]   Endocrine: Diabetes [ ] ; Thyroid dysfunction[ ]   Home Medications Prior to Admission medications   Medication Sig Start Date End Date Taking? Authorizing Provider  acetaminophen (TYLENOL) 325 MG tablet Take 2 tablets (650 mg total) by mouth every 6 (six) hours as needed for mild pain. 11/09/15  Yes [name], MD  amiodarone (PACERONE) 200 MG tablet Take 1 tablet (200 mg total) by mouth daily. 01/10/16  Yes [name], MD  carvedilol (COREG) 3.125 MG tablet Take 1 tablet (3.125 mg total) by mouth 2 (two) times daily. 01/10/16  Yes [name], MD  phenytoin (DILANTIN)  100 MG ER capsule Take 1 capsule (100 mg total) by mouth 3 (three) times daily. 12/30/15  Yes [name], MD    Past Medical History: Past Medical History  Diagnosis Date  . CAD (coronary artery disease)   . Chronic systolic heart failure ([place])   . Ischemic cardiomyopathy     EF 15 to 20% by TTE and TEE in 09/2012.  Severe LV dysfunction  . HTN (hypertension)   . Hyperlipidemia   . Obesity     BMI 31 in 09/2012  . Atrial fibrillation   09/22/2012  . implantable cardiac defibrillator-Biotronik     Device Implanted 2006; s/p gen change 03/2011 : bleeding persistent with pocket erosion and infection; explant and  reimplant  06/2011  . Stroke ([place])   . 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 History of MRSA infection 05/2011  . Seizure disorder ([place]) latest 09/30/2012  . 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 Seizures ([place])     Past Surgical History: Past Surgical History  Procedure Laterality Date  . Coronary artery bypass graft    . Tee without cardioversion  09/24/2012    Procedure: TRANSESOPHAGEAL ECHOCARDIOGRAM (TEE);  Surgeon: [name], MD;  Location: [place];  Service: Cardiovascular;  Laterality: N/A;  dave/anesth, dl, cindy/echo   . Cardioversion  09/24/2012    Procedure: CARDIOVERSION;  Surgeon: [name], MD;  Location: [facility] ENDOSCOPY;  Service: Cardiovascular;  Laterality: N/A;  . Icd    . Cardiac catheterization N/A 10/12/2015    Procedure: Right Heart Cath;  Surgeon: [name], MD;  Location: [place] CV LAB;  Service: Cardiovascular;  Laterality: N/A;    Family History: Family History  Problem Relation Age of Onset  . Arthritis Mother   . Heart disease Mother   . Other Father     smoker  . Heart attack Mother   . Hypertension Neg Hx     unknown  . Stroke Neg Hx     unknown    Social History: Social History   Social History  . Marital Status: Single    Spouse Name: N/A  . Number of Children: N/A  . Years of Education: N/A   Social History Main Topics  . Smoking status: Former Smoker    Types: Pipe    Quit date: 09/21/2008  . Smokeless tobacco: Former Systems developer    Quit date: 09/21/2008  . Alcohol Use: No  . Drug Use: No  . Sexual Activity: No   Other Topics Concern  . None   Social History Narrative   ** Merged History Encounter **        Allergies:  No Known Allergies  Objective:    Vital Signs:   Temp:  [97.8 F (36.6 C)-98.3 F (36.8 C)] 98.3 F (36.8 C) (03/06 0635) Pulse Rate:  [67-99] 71 (03/06 1140) Resp:  [13-20] 18 (03/06 1140) BP: (99-128)/(59-107) 99/81 mmHg (03/06 1140) SpO2:  [93 %-100 %] 100 % (03/06  1140) Weight:  [224 lb 1.6 oz (101.651 kg)-227 lb 12.8 oz (103.329 kg)] 224 lb 1.6 oz (101.651 kg) (03/06 0126)    Weight change: Filed Weights   02/26/16 1837 02/27/16 0126  Weight: 227 lb 12.8 oz (103.329 kg) 224 lb 1.6 oz (101.651 kg)    Intake/Output:   Intake/Output Summary (Last 24 hours) at 02/27/16 1429 Last data filed at 02/27/16 1219  Gross per 24 hour  Intake   2035 ml  Output    525 ml  Net   1510 ml     Physical Exam: General:  Elderly appearing, NAD HEENT: normal Neck: supple. JVP 6-7 with mild HJR. Carotids 2+ bilat; no bruits. No thyromegaly or nodule noted.  Cor: PMI nondisplaced. Regular rate & rhythm. No M/G/R appreciated Lungs: CTAB, normal effort Abdomen: soft, NT, ND, no HSM. No bruits or masses. +BS  Extremities: no cyanosis, clubbing, rash, edema Neuro: alert & orientedx3, cranial nerves grossly intact. moves all 4 extremities w/o difficulty. Affect pleasant  Telemetry: Reviewed personally, NSR 70s  Labs: Basic Metabolic Panel:  Recent Labs Lab 02/26/16 1906 02/26/16 2115 02/27/16 0134 02/27/16 0511  NA 138  --   --  140  K 5.4*  --   --  4.0  CL 100*  --   --  104  CO2 26  --   --  27  GLUCOSE 85  --   --  122*  BUN 32*  --   --  32*  CREATININE 1.65*  --  1.55* 1.52*  CALCIUM 9.1  --   --  8.5*  MG  --  1.9 1.8  --     Liver Function Tests:  Recent Labs Lab 02/26/16 2115 02/27/16 0511  AST 27 26  ALT 18 16*  ALKPHOS 126 117  BILITOT 0.9 0.6  PROT 8.2* 6.7  ALBUMIN 3.8 3.3*   No results for input(s): LIPASE, AMYLASE in the last 168 hours. No results for input(s): AMMONIA in the last 168 hours.  CBC:  Recent Labs Lab 02/26/16 1906 02/27/16 0134  WBC 4.7 4.6  HGB 15.1 14.0  HCT 45.3 42.9  MCV 87.1 87.9  PLT 137* 138*    Cardiac Enzymes:  Recent Labs Lab 02/27/16 0134 02/27/16 0511 02/27/16 0942  TROPONINI <0.03 <0.03 <0.03    BNP: BNP (last 3 results)  Recent Labs  10/04/15 1604 10/31/15 1551  12/01/15 1015  BNP 796.9* 53.9 70.1    ProBNP (last 3 results) No results for input(s): PROBNP in the last 8760 hours.   CBG:  Recent Labs Lab 02/26/16 1835 02/26/16 2252 02/27/16 0001  GLUCAP 85 67 118*    Coagulation Studies:  Recent Labs  02/26/16 2115 02/27/16 0511  LABPROT 30.6* 34.3*  INR 3.00* 3.48*    Other results: EKG: 02/26/16 NSR @ 76 bpms with PVCs   Imaging: Dg Chest 2 View  02/26/2016  CLINICAL DATA:  Weakness, fatigue, diaphoresis. EXAM: CHEST  2 VIEW COMPARISON:  10/13/2015 FINDINGS: Postoperative changes in the mediastinum. Cardiac pacemaker. Normal heart size and pulmonary vascularity. Atelectasis in the lung bases. No focal consolidation or airspace disease. No blunting of costophrenic angles. No pneumothorax. Tortuous aorta. Degenerative changes in the spine. IMPRESSION: Atelectasis in the lung bases. Electronically Signed   By: Lucienne Capers M.D.   On: 02/26/2016 22:38   Ct Head Wo Contrast  02/27/2016  CLINICAL DATA:  Weakness and near syncope. EXAM: CT HEAD WITHOUT CONTRAST TECHNIQUE: Contiguous axial images were obtained from the base of the skull through the vertex without intravenous contrast. COMPARISON:  09/26/2015 FINDINGS: Mild cerebral atrophy. Mild ventricular dilatation consistent with central atrophy. Patchy low-attenuation changes in the deep white matter consistent with small vessel ischemia. No mass effect or midline shift. No abnormal extra-axial fluid collections. Gray-white matter junctions are distinct. Basal cisterns are not effaced. No evidence of acute intracranial hemorrhage. No depressed skull fractures. Opacification of some of the right mastoid air cells. Mild mucosal thickening in the paranasal sinuses. Vascular calcifications. IMPRESSION: No acute  intracranial abnormalities. Mild chronic atrophy and small vessel ischemic changes similar to previous study. Electronically Signed   By: Lucienne Capers M.D.   On: 02/27/2016 01:21       Medications:     Current Medications: . amiodarone  200 mg Oral Daily  . aspirin EC  81 mg Oral Daily  . carvedilol  3.125 mg Oral BID WC  . furosemide  40 mg Oral Daily  . [START ON 02/28/2016] Influenza vac split quadrivalent PF  0.5 mL Intramuscular Tomorrow-1000  . magnesium oxide  200 mg Oral BID  . sodium chloride flush  3 mL Intravenous Q12H  . Warfarin - Pharmacist Dosing Inpatient   Does not apply q1800     Infusions:      Assessment   1. Near syncope/hypotension 2. Chronic systolic CHF 3. PAF - now back in NSR 4. HTN 5. Acute on chronic kidney failure stage III 6. Gout  Plan    Presented to ED yesterday after near syncope x 2. Device interrogation negative for acute events although does show some Afib of indeterminate length. Telemetry today shows NSR.   Repeat Echo pending.  ? If he could be dry with bump in Cr and BUN. Orthostatics negative. Creatine 1.52 this am, closer to baseline of 1.3-1.4. Agree with decrease of home lasix to 40 mg daily.    Need to add back on home HF meds as tolerated.  We will follow along with you.    Length of Stay:   Shirley Friar PA-C 02/27/2016, 2:29 PM  Advanced Heart Failure Team Pager [phone] (M-F; 7a - 4p)  Please contact [facility] for night-coverage after hours (4p -7a ) and weekends on [email or web address]  Patient seen and examined with Oda Kilts, PA-C. We discussed all aspects of the encounter. I agree with the assessment and plan as stated above.   ICD interrogation reviewed personally. Suspect near syncope related to hypotension in the setting of volume depletion and recurrent AF. AF now resolved. He has improved with fluids. Will decrease lasix to 40 daily. Increase amiodarone to 200 bid. Continue coumadin. Likely will be stable for d/c home in the am with close f/u in the HF Clinic.   Prajwal Fellner,MD 9:09 PM

## 2016-02-27 NOTE — Consult Note (Signed)
Advanced Heart Failure Team Consult Note  Referring Physician: Dr. Tyrell Antonio Primary Care: Dr Pricilla Holm Primary Cardiologist: Dr Lovena Le Primary HF: Dr. Haroldine Laws  Reason for Consultation: Near syncope, HF patient with complicated hx.  HPI:    Louis Ford. is a 70 y.o. male with hx of CAD s/p CABG, ICM s/p Biotronik ICD, chronic systolic CHF Echo [id number] LVEF 30-35%; normal RV; PA peak pressure 34 mm Hg, HTN, HLD, paroxysmal atrial fibrillation, previous stroke, Factor 7 deficiency, and seizure disorder followed in the HF clinic.  He presented to [facility] 02/26/16 after 2 "near syncopal episodes". One at [employer] ( lunch after church) and once during the evening service. He states he felt weak, diaphoretic and just "not right". Denies CP or seizure. No ABD pain or vomiting. Denies ICD shocks.  Pertinent labs on admission include K 5.4, Creatinine 1.65, Troponin flat/negative, Hgb normal. INR 3.0 on admit. 3.48 today.   He denies any recent illnesses.  Takes all of his medication as directed. Lasix makes him pee really well still. He states he was in his usual state of health up until yesterday and can think of no specific triggering factors.  Of note, he is actually down > 10 lbs since the last time we saw him in clinic. He has had no other lightheadedness or dizziness leading up to yesterday.  Did not miss any medications.  He does not watch his salt or fluid in take as well as he should, he states. He states he did urinate on himself twice several weeks ago and has a strong smell in his urine, though UA was negative yesterday.    Review of Systems: [y] = yes, [ ]  = no   General: Weight gain [ ] ; Weight loss [ ] ; Anorexia [ ] ; Fatigue [ ] ; Fever [ ] ; Chills [ ] ; Weakness [y]  Cardiac: Chest pain/pressure [ ] ; Resting SOB [ ] ; Exertional SOB [ ] ; Orthopnea [ ] ; Pedal Edema [ ] ; Palpitations [ ] ; Syncope [ ] ; Presyncope [y]; Paroxysmal nocturnal dyspnea[ ]   Pulmonary: Cough [ ] ;  Wheezing[ ] ; Hemoptysis[ ] ; Sputum [ ] ; Snoring [ ]   GI: Vomiting[ ] ; Dysphagia[ ] ; Melena[ ] ; Hematochezia [ ] ; Heartburn[ ] ; Abdominal pain [ ] ; Constipation [ ] ; Diarrhea [ ] ; BRBPR [ ]   GU: Hematuria[ ] ; Dysuria [ ] ; Nocturia[ ]   Vascular: Pain in legs with walking [ ] ; Pain in feet with lying flat [ ] ; Non-healing sores [ ] ; Stroke [ ] ; TIA [ ] ; Slurred speech [ ] ;  Neuro: Headaches[ ] ; Vertigo[ ] ; Seizures[ ] ; Paresthesias[ ] ;Blurred vision [ ] ; Diplopia [ ] ; Vision changes [ ]   Ortho/Skin: Arthritis [y]; Joint pain [y]; Muscle pain [ ] ; Joint swelling [ ] ; Back Pain [ ] ; Rash [ ]   Psych: Depression[ ] ; Anxiety[ ]   Heme: Bleeding problems [ ] ; Clotting disorders [ ] ; Anemia [ ]   Endocrine: Diabetes [ ] ; Thyroid dysfunction[ ]   Home Medications Prior to Admission medications   Medication Sig Start Date End Date Taking? Authorizing Provider  acetaminophen (TYLENOL) 325 MG tablet Take 2 tablets (650 mg total) by mouth every 6 (six) hours as needed for mild pain. 11/09/15  Yes [name], MD  amiodarone (PACERONE) 200 MG tablet Take 1 tablet (200 mg total) by mouth daily. 01/10/16  Yes [name], MD  carvedilol (COREG) 3.125 MG tablet Take 1 tablet (3.125 mg total) by mouth 2 (two) times daily. 01/10/16  Yes [name], MD  phenytoin (DILANTIN)  100 MG ER capsule Take 1 capsule (100 mg total) by mouth 3 (three) times daily. 12/30/15  Yes [name], MD    Past Medical History: Past Medical History  Diagnosis Date  . CAD (coronary artery disease)   . Chronic systolic heart failure ([place])   . Ischemic cardiomyopathy     EF 15 to 20% by TTE and TEE in 09/2012.  Severe LV dysfunction  . HTN (hypertension)   . Hyperlipidemia   . Obesity     BMI 31 in 09/2012  . Atrial fibrillation   09/22/2012  . implantable cardiac defibrillator-Biotronik     Device Implanted 2006; s/p gen change 03/2011 : bleeding persistent with pocket erosion and infection; explant and  reimplant  06/2011  . Stroke ([place])   . 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 History of MRSA infection 05/2011  . Seizure disorder ([place]) latest 09/30/2012  . 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 Seizures ([place])     Past Surgical History: Past Surgical History  Procedure Laterality Date  . Coronary artery bypass graft    . Tee without cardioversion  09/24/2012    Procedure: TRANSESOPHAGEAL ECHOCARDIOGRAM (TEE);  Surgeon: [name], MD;  Location: [place];  Service: Cardiovascular;  Laterality: N/A;  dave/anesth, dl, cindy/echo   . Cardioversion  09/24/2012    Procedure: CARDIOVERSION;  Surgeon: [name], MD;  Location: [facility] ENDOSCOPY;  Service: Cardiovascular;  Laterality: N/A;  . Icd    . Cardiac catheterization N/A 10/12/2015    Procedure: Right Heart Cath;  Surgeon: [name], MD;  Location: [place] CV LAB;  Service: Cardiovascular;  Laterality: N/A;    Family History: Family History  Problem Relation Age of Onset  . Arthritis Mother   . Heart disease Mother   . Other Father     smoker  . Heart attack Mother   . Hypertension Neg Hx     unknown  . Stroke Neg Hx     unknown    Social History: Social History   Social History  . Marital Status: Single    Spouse Name: N/A  . Number of Children: N/A  . Years of Education: N/A   Social History Main Topics  . Smoking status: Former Smoker    Types: Pipe    Quit date: 09/21/2008  . Smokeless tobacco: Former Systems developer    Quit date: 09/21/2008  . Alcohol Use: No  . Drug Use: No  . Sexual Activity: No   Other Topics Concern  . None   Social History Narrative   ** Merged History Encounter **        Allergies:  No Known Allergies  Objective:    Vital Signs:   Temp:  [98.1 F (36.7 C)-98.3 F (36.8 C)] 98.3 F (36.8 C) (03/06 2012) Pulse Rate:  [65-99] 65 (03/06 2012) Resp:  [14-19] 18 (03/06 2012) BP: (99-128)/(59-102) 102/63 mmHg (03/06 2012) SpO2:  [93 %-100 %] 100 % (03/06  2012) Weight:  [101.651 kg (224 lb 1.6 oz)] 101.651 kg (224 lb 1.6 oz) (03/06 0126) Last BM Date: 02/25/16  Weight change: Filed Weights   02/26/16 1837 02/27/16 0126  Weight: 103.329 kg (227 lb 12.8 oz) 101.651 kg (224 lb 1.6 oz)    Intake/Output:   Intake/Output Summary (Last 24 hours) at 02/27/16 2112 Last data filed at 02/27/16 1826  Gross per 24 hour  Intake   2635 ml  Output    925 ml  Net  1710 ml     Physical Exam: General:  Elderly appearing, NAD HEENT: normal Neck: supple. JVP 6-7 with mild HJR. Carotids 2+ bilat; no bruits. No thyromegaly or nodule noted.  Cor: PMI nondisplaced. Regular rate & rhythm. No M/G/R appreciated Lungs: CTAB, normal effort Abdomen: soft, NT, ND, no HSM. No bruits or masses. +BS  Extremities: no cyanosis, clubbing, rash, edema Neuro: alert & orientedx3, cranial nerves grossly intact. moves all 4 extremities w/o difficulty. Affect pleasant  Telemetry: Reviewed personally, NSR 70s  Labs: Basic Metabolic Panel:  Recent Labs Lab 02/26/16 1906 02/26/16 2115 02/27/16 0134 02/27/16 0511  NA 138  --   --  140  K 5.4*  --   --  4.0  CL 100*  --   --  104  CO2 26  --   --  27  GLUCOSE 85  --   --  122*  BUN 32*  --   --  32*  CREATININE 1.65*  --  1.55* 1.52*  CALCIUM 9.1  --   --  8.5*  MG  --  1.9 1.8  --     Liver Function Tests:  Recent Labs Lab 02/26/16 2115 02/27/16 0511  AST 27 26  ALT 18 16*  ALKPHOS 126 117  BILITOT 0.9 0.6  PROT 8.2* 6.7  ALBUMIN 3.8 3.3*   No results for input(s): LIPASE, AMYLASE in the last 168 hours. No results for input(s): AMMONIA in the last 168 hours.  CBC:  Recent Labs Lab 02/26/16 1906 02/27/16 0134  WBC 4.7 4.6  HGB 15.1 14.0  HCT 45.3 42.9  MCV 87.1 87.9  PLT 137* 138*    Cardiac Enzymes:  Recent Labs Lab 02/27/16 0134 02/27/16 0511 02/27/16 0942  TROPONINI <0.03 <0.03 <0.03    BNP: BNP (last 3 results)  Recent Labs  10/04/15 1604 10/31/15 1551  12/01/15 1015  BNP 796.9* 53.9 70.1    ProBNP (last 3 results) No results for input(s): PROBNP in the last 8760 hours.   CBG:  Recent Labs Lab 02/26/16 1835 02/26/16 2252 02/27/16 0001  GLUCAP 85 67 118*    Coagulation Studies:  Recent Labs  02/26/16 2115 02/27/16 0511  LABPROT 30.6* 34.3*  INR 3.00* 3.48*    Other results: EKG: 02/26/16 NSR @ 76 bpms with PVCs   Imaging: Dg Chest 2 View  02/26/2016  CLINICAL DATA:  Weakness, fatigue, diaphoresis. EXAM: CHEST  2 VIEW COMPARISON:  10/13/2015 FINDINGS: Postoperative changes in the mediastinum. Cardiac pacemaker. Normal heart size and pulmonary vascularity. Atelectasis in the lung bases. No focal consolidation or airspace disease. No blunting of costophrenic angles. No pneumothorax. Tortuous aorta. Degenerative changes in the spine. IMPRESSION: Atelectasis in the lung bases. Electronically Signed   By: Lucienne Capers M.D.   On: 02/26/2016 22:38   Ct Head Wo Contrast  02/27/2016  CLINICAL DATA:  Weakness and near syncope. EXAM: CT HEAD WITHOUT CONTRAST TECHNIQUE: Contiguous axial images were obtained from the base of the skull through the vertex without intravenous contrast. COMPARISON:  09/26/2015 FINDINGS: Mild cerebral atrophy. Mild ventricular dilatation consistent with central atrophy. Patchy low-attenuation changes in the deep white matter consistent with small vessel ischemia. No mass effect or midline shift. No abnormal extra-axial fluid collections. Gray-white matter junctions are distinct. Basal cisterns are not effaced. No evidence of acute intracranial hemorrhage. No depressed skull fractures. Opacification of some of the right mastoid air cells. Mild mucosal thickening in the paranasal sinuses. Vascular calcifications. IMPRESSION: No acute intracranial abnormalities. Mild  chronic atrophy and small vessel ischemic changes similar to previous study. Electronically Signed   By: Lucienne Capers M.D.   On: 02/27/2016 01:21      Medications:     Current Medications: . amiodarone  200 mg Oral BID  . aspirin EC  81 mg Oral Daily  . [START ON 02/28/2016] carvedilol  3.125 mg Oral BID WC  . furosemide  40 mg Oral Daily  . [START ON 02/28/2016] Influenza vac split quadrivalent PF  0.5 mL Intramuscular Tomorrow-1000  . magnesium oxide  200 mg Oral BID  . sodium chloride flush  3 mL Intravenous Q12H  . Warfarin - Pharmacist Dosing Inpatient   Does not apply q1800    Infusions:     Assessment   1. Near syncope/hypotension 2. Chronic systolic CHF 3. PAF - now back in NSR 4. HTN 5. Acute on chronic kidney failure stage III 6. Gout  Plan    Presented to ED yesterday after near syncope x 2. Device interrogation negative for acute events although does show some Afib of indeterminate length. Telemetry today shows NSR.   Repeat Echo pending.  ? If he could be dry with bump in Cr and BUN. Orthostatics negative. Creatine 1.52 this am, closer to baseline of 1.3-1.4. Agree with decrease of home lasix to 40 mg daily.    Need to add back on home HF meds as tolerated.  We will follow along with you.    Length of Stay:   Lance Sell 02/27/2016, 9:12 PM  Advanced Heart Failure Team Pager [phone] (M-F; [place])  Please contact [facility] for night-coverage after hours (4p -7a ) and weekends on [email or web address]  Patient seen and examined with Oda Kilts, PA-C. We discussed all aspects of the encounter. I agree with the assessment and plan as stated above.   ICD interrogation reviewed personally. Suspect near syncope related to hypotension in the setting of volume depletion and recurrent AF. AF now resolved. He has improved with fluids. Will decrease lasix to 40 daily. Increase amiodarone to 200 bid. Continue coumadin. Likely will be stable for d/c home in the am with close f/u in the HF Clinic.   [name], Daniel,MD 9:12 PM

## 2016-02-27 NOTE — Progress Notes (Signed)
Amion texted to inform physician of the following critical lab value:  Lab taken at 0941, 02/27/16, for Dilantin = 32.5  (reported to Rn via Liliane Bade at 1045). Unfortunately, I had just given the scheduled Dilantin 100mg  ER at 1037. Physician is aware.

## 2016-02-27 NOTE — Progress Notes (Signed)
ANTICOAGULATION CONSULT NOTE - Initial Consult  Pharmacy Consult for Coumadin Indication: atrial fibrillation  No Known Allergies  Patient Measurements: Height: 5\' 10"  (177.8 cm) Weight: 224 lb 1.6 oz (101.651 kg) (scale c) IBW/kg (Calculated) : 73  Vital Signs: Temp: 98.3 F (36.8 C) (03/06 0635) Temp Source: Oral (03/06 0635) BP: 107/77 mmHg (03/06 0635) Pulse Rate: 69 (03/06 0635)  Labs:  Recent Labs  02/26/16 1906 02/26/16 2115 02/27/16 0134 02/27/16 0511  HGB 15.1  --  14.0  --   HCT 45.3  --  42.9  --   PLT 137*  --  138*  --   LABPROT  --  30.6*  --  34.3*  INR  --  3.00*  --  3.48*  CREATININE 1.65*  --  1.55* 1.52*  TROPONINI  --   --  <0.03 <0.03    Estimated Creatinine Clearance: 56.4 mL/min (by C-G formula based on Cr of 1.52).   Medical History: Past Medical History  Diagnosis Date  . CAD (coronary artery disease)   . Chronic systolic heart failure ([place])   . Ischemic cardiomyopathy     EF 15 to 20% by TTE and TEE in 09/2012.  Severe LV dysfunction  . HTN (hypertension)   . Hyperlipidemia   . Obesity     BMI 31 in 09/2012  . Atrial fibrillation   09/22/2012  . implantable cardiac defibrillator-Biotronik     Device Implanted 2006; s/p gen change 03/2011 : bleeding persistent with pocket erosion and infection; explant and reimplant  06/2011  . Stroke ([place])   . 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 History of MRSA infection 05/2011  . Seizure disorder ([place]) latest 09/30/2012  . 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 Seizures [facility])      Assessment: 70 y.o. male admitted with AMS/weakness, h/o Afib, to continue Coumadin. INR 3.48, continue trending up.   PTA Coumadin dose: 4 mg daily except 2 mg Sat (last dose 3/5)  Goal of Therapy:  INR 2-3 Monitor platelets by anticoagulation protocol: Yes   Plan:  Hold coumadin today Daily INR  Maryanna Shape, PharmD, BCPS  Clinical Pharmacist  Pager: [phone]   02/27/2016,10:32 AM

## 2016-02-27 NOTE — Progress Notes (Signed)
  Echocardiogram 2D Echocardiogram has been performed.  Louis Ford 02/27/2016, 3:34 PM

## 2016-02-28 ENCOUNTER — Observation Stay (HOSPITAL_COMMUNITY): Payer: Medicare Other

## 2016-02-28 DIAGNOSIS — R55 Syncope and collapse: Secondary | ICD-10-CM | POA: Diagnosis not present

## 2016-02-28 DIAGNOSIS — M179 Osteoarthritis of knee, unspecified: Secondary | ICD-10-CM | POA: Diagnosis not present

## 2016-02-28 DIAGNOSIS — I480 Paroxysmal atrial fibrillation: Secondary | ICD-10-CM | POA: Diagnosis not present

## 2016-02-28 DIAGNOSIS — I5022 Chronic systolic (congestive) heart failure: Secondary | ICD-10-CM | POA: Diagnosis not present

## 2016-02-28 LAB — BASIC METABOLIC PANEL
ANION GAP: 11 (ref 5–15)
BUN: 29 mg/dL — ABNORMAL HIGH (ref 6–20)
CALCIUM: 9 mg/dL (ref 8.9–10.3)
CO2: 27 mmol/L (ref 22–32)
Chloride: 103 mmol/L (ref 101–111)
Creatinine, Ser: 1.28 mg/dL — ABNORMAL HIGH (ref 0.61–1.24)
GFR calc non Af Amer: 56 mL/min — ABNORMAL LOW (ref >60)
Glucose, Bld: 89 mg/dL (ref 65–99)
POTASSIUM: 4.6 mmol/L (ref 3.5–5.1)
Sodium: 141 mmol/L (ref 135–145)

## 2016-02-28 LAB — URIC ACID: URIC ACID, SERUM: 5.2 mg/dL (ref 4.4–7.6)

## 2016-02-28 LAB — PROTIME-INR
INR: 3.18 — AB (ref 0.00–1.49)
Prothrombin Time: 32 seconds — ABNORMAL HIGH (ref 11.6–15.2)

## 2016-02-28 LAB — PHENYTOIN LEVEL, TOTAL: Phenytoin Lvl: 29.9 ug/mL — ABNORMAL HIGH (ref 10.0–20.0)

## 2016-02-28 LAB — T3, FREE: T3 FREE: 1.8 pg/mL — AB (ref 2.0–4.4)

## 2016-02-28 MED ORDER — SIMVASTATIN 20 MG PO TABS
20.0000 mg | ORAL_TABLET | Freq: Every day | ORAL | Status: DC
Start: 2016-02-28 — End: 2016-02-29
  Administered 2016-02-28: 20 mg via ORAL
  Filled 2016-02-28: qty 1

## 2016-02-28 MED ORDER — DICLOFENAC SODIUM 1 % TD GEL
2.0000 g | Freq: Four times a day (QID) | TRANSDERMAL | Status: DC
  Administered 2016-02-28 – 2016-02-29 (×2): 2 g via TOPICAL
  Filled 2016-02-28: qty 100

## 2016-02-28 MED ORDER — WARFARIN SODIUM 2 MG PO TABS
2.0000 mg | ORAL_TABLET | Freq: Once | ORAL | Status: AC
Start: 2016-02-28 — End: 2016-02-28
  Administered 2016-02-28: 2 mg via ORAL
  Filled 2016-02-28: qty 1

## 2016-02-28 NOTE — Progress Notes (Signed)
ANTICOAGULATION CONSULT NOTE - Follow Up Consult  Pharmacy Consult for Coumadin Indication: atrial fibrillation  No Known Allergies  Patient Measurements: Height: 5\' 10"  (177.8 cm) Weight: 226 lb 4.8 oz (102.649 kg) (scale c) IBW/kg (Calculated) : 73  Vital Signs:    Labs:  Recent Labs  02/26/16 1906 02/26/16 2115 02/27/16 0134 02/27/16 0511 02/27/16 0942 02/28/16 0513  HGB 15.1  --  14.0  --   --   --   HCT 45.3  --  42.9  --   --   --   PLT 137*  --  138*  --   --   --   LABPROT  --  30.6*  --  34.3*  --  32.0*  INR  --  3.00*  --  3.48*  --  3.18*  CREATININE 1.65*  --  1.55* 1.52*  --  1.28*  TROPONINI  --   --  <0.03 <0.03 <0.03  --     Estimated Creatinine Clearance: 67.2 mL/min (by C-G formula based on Cr of 1.28).   Medical History: Past Medical History  Diagnosis Date  . CAD (coronary artery disease)   . Chronic systolic heart failure ([place])   . Ischemic cardiomyopathy     EF 15 to 20% by TTE and TEE in 09/2012.  Severe LV dysfunction  . HTN (hypertension)   . Hyperlipidemia   . Obesity     BMI 31 in 09/2012  . Atrial fibrillation   09/22/2012  . implantable cardiac defibrillator-Biotronik     Device Implanted 2006; s/p gen change 03/2011 : bleeding persistent with pocket erosion and infection; explant and reimplant  06/2011  . Stroke ([place])   . 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 History of MRSA infection 05/2011  . Seizure disorder ([place]) latest 09/30/2012  . 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 Seizures [facility])      Assessment: 70 y.o. male admitted with AMS/weakness, h/o Afib, to continue Coumadin. INR 3.48>3.18 after holding dose.  Wil restart low dose to prevent large drop   PTA Coumadin dose: 4 mg daily except 2 mg Sat (last dose 3/5)  Goal of Therapy:  INR 2-3 Monitor platelets by anticoagulation protocol: Yes   Plan:  2mg  coumadin today Daily INR  Bonnita Nasuti Pharm.D. CPP, BCPS Clinical Pharmacist [phone] 02/28/2016 1:54  PM

## 2016-02-28 NOTE — Progress Notes (Signed)
Advanced Heart Failure Rounding Note   Subjective:    Denies dyspnea. C/o left knee pain. No further dizziness. Dilantin level 30.    Objective:   Weight Range:  Vital Signs:   Temp:  [98.3 F (36.8 C)-98.7 F (37.1 C)] 98.7 F (37.1 C) (03/07 0134) Pulse Rate:  [61-65] 61 (03/07 0134) Resp:  [18] 18 (03/07 0134) BP: (102-111)/(63-70) 111/70 mmHg (03/07 0134) SpO2:  [100 %] 100 % (03/07 0134) Weight:  [102.649 kg (226 lb 4.8 oz)] 102.649 kg (226 lb 4.8 oz) (03/07 0526) Last BM Date: 02/25/16  Weight change: Filed Weights   02/26/16 1837 02/27/16 0126 02/28/16 0526  Weight: 103.329 kg (227 lb 12.8 oz) 101.651 kg (224 lb 1.6 oz) 102.649 kg (226 lb 4.8 oz)    Intake/Output:   Intake/Output Summary (Last 24 hours) at 02/28/16 1303 Last data filed at 02/28/16 0925  Gross per 24 hour  Intake   1555 ml  Output   2000 ml  Net   -445 ml     Physical Exam: General:  Well appearing. No resp difficulty HEENT: normal Neck: supple. JVP 7-8 . Carotids 2+ bilat; no bruits. No lymphadenopathy or thryomegaly appreciated. Cor: PMI nondisplaced. Regular rate & rhythm. No rubs, gallops or murmurs. Lungs: clear Abdomen: soft, nontender, nondistended. No hepatosplenomegaly. No bruits or masses. Good bowel sounds. Extremities: no cyanosis, clubbing, rash, edema Neuro: alert & orientedx3, cranial nerves grossly intact. moves all 4 extremities w/o difficulty. Affect pleasant  Telemetry:  NSR  Labs: Basic Metabolic Panel:  Recent Labs Lab 02/26/16 1906 02/26/16 2115 02/27/16 0134 02/27/16 0511 02/28/16 0513  NA 138  --   --  140 141  K 5.4*  --   --  4.0 4.6  CL 100*  --   --  104 103  CO2 26  --   --  27 27  GLUCOSE 85  --   --  122* 89  BUN 32*  --   --  32* 29*  CREATININE 1.65*  --  1.55* 1.52* 1.28*  CALCIUM 9.1  --   --  8.5* 9.0  MG  --  1.9 1.8  --   --     Liver Function Tests:  Recent Labs Lab 02/26/16 2115 02/27/16 0511  AST 27 26  ALT 18 16*    ALKPHOS 126 117  BILITOT 0.9 0.6  PROT 8.2* 6.7  ALBUMIN 3.8 3.3*   No results for input(s): LIPASE, AMYLASE in the last 168 hours. No results for input(s): AMMONIA in the last 168 hours.  CBC:  Recent Labs Lab 02/26/16 1906 02/27/16 0134  WBC 4.7 4.6  HGB 15.1 14.0  HCT 45.3 42.9  MCV 87.1 87.9  PLT 137* 138*    Cardiac Enzymes:  Recent Labs Lab 02/27/16 0134 02/27/16 0511 02/27/16 0942  TROPONINI <0.03 <0.03 <0.03    BNP: BNP (last 3 results)  Recent Labs  10/04/15 1604 10/31/15 1551 12/01/15 1015  BNP 796.9* 53.9 70.1    ProBNP (last 3 results) No results for input(s): PROBNP in the last 8760 hours.    Other results:  Imaging: Dg Chest 2 View  02/26/2016  CLINICAL DATA:  Weakness, fatigue, diaphoresis. EXAM: CHEST  2 VIEW COMPARISON:  10/13/2015 FINDINGS: Postoperative changes in the mediastinum. Cardiac pacemaker. Normal heart size and pulmonary vascularity. Atelectasis in the lung bases. No focal consolidation or airspace disease. No blunting of costophrenic angles. No pneumothorax. Tortuous aorta. Degenerative changes in the spine. IMPRESSION: Atelectasis in  the lung bases. Electronically Signed   By: Lucienne Capers M.D.   On: 02/26/2016 22:38   Ct Head Wo Contrast  02/27/2016  CLINICAL DATA:  Weakness and near syncope. EXAM: CT HEAD WITHOUT CONTRAST TECHNIQUE: Contiguous axial images were obtained from the base of the skull through the vertex without intravenous contrast. COMPARISON:  09/26/2015 FINDINGS: Mild cerebral atrophy. Mild ventricular dilatation consistent with central atrophy. Patchy low-attenuation changes in the deep white matter consistent with small vessel ischemia. No mass effect or midline shift. No abnormal extra-axial fluid collections. Gray-white matter junctions are distinct. Basal cisterns are not effaced. No evidence of acute intracranial hemorrhage. No depressed skull fractures. Opacification of some of the right mastoid air  cells. Mild mucosal thickening in the paranasal sinuses. Vascular calcifications. IMPRESSION: No acute intracranial abnormalities. Mild chronic atrophy and small vessel ischemic changes similar to previous study. Electronically Signed   By: Lucienne Capers M.D.   On: 02/27/2016 01:21      Medications:     Scheduled Medications: . amiodarone  200 mg Oral BID  . aspirin EC  81 mg Oral Daily  . carvedilol  3.125 mg Oral BID WC  . furosemide  40 mg Oral Daily  . magnesium oxide  200 mg Oral BID  . sodium chloride flush  3 mL Intravenous Q12H  . Warfarin - Pharmacist Dosing Inpatient   Does not apply q1800     Infusions:     PRN Medications:  sodium chloride, acetaminophen, ondansetron (ZOFRAN) IV, sodium chloride flush   Assessment:   1. Near syncope/hypotension 2. Chronic systolic CHF 3. PAF - now back in NSR 4. HTN 5. Acute on chronic kidney failure stage III 6. Gout   Plan/Discussion:    Volume status looks good. No further AF. [place] for d/c when dilantin level down. Watch for possibility of recurrent gout in left knee.   Length of Stay:    Glori Bickers MD 02/28/2016, 1:03 PM  Advanced Heart Failure Team Pager [phone] (M-F; 7a - 4p)  Please contact [facility] for night-coverage after hours (4p -7a ) and weekends on [email or web address]

## 2016-02-28 NOTE — Care Management Note (Signed)
Case Management Note  Patient Details  Name: [facility]. MRN: [id number] Date of Birth: [birth year]  Subjective/Objective:         Admitted with Near Syncope           Action/Plan: CM talked to patient about DCP; pt requested that I talk to his minister Marcell Barlow [phone]) she makes all of the medical decisions for the patient; She agreed to [facility] services, choice offered, she chose [facility] for [place]. Mary with [facility] called for arrangements. She is also agreeable to have the patient work with [place] for CHF, referral made as requested. Attending MD at discharge please enter the face to face form for [facility] services.   Expected Discharge Date:     Possibly 02/28/2016             Expected Discharge Plan:  [place]  Discharge planning Services  CM Consult  Post Acute Care Choice:    Choice offered to:  [facility] POA / Guardian  HH Arranged:  RN, Disease Management, PT [place] Agency:  [facility]  Status of Service:  In process, will continue to follow  Sherrilyn Rist [phone] 02/28/2016, 10:58 AM

## 2016-02-28 NOTE — Progress Notes (Signed)
[facility] PROGRESS NOTE  [facility]. [id number] DOB: 30-Jul-1946 DOA: 02/26/2016 PCP: [name], MD  Assessment/Plan: [facility] [name] is a 69 y.o. 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 First episode was while at [facility]- he felt globally weak, diaphoretic and "not right". He denies any pain or seizure activity. No vomiting or diarrhea. He reports feeling as though he has been eating well, endorses a strong odor to his urine. EMS came and evaluated him and felt that he was safe for home. He went to church when the episode happened again and this time he decided to come to the ER. He did not feel his Biotronik fire.  1-Near Syncope:  Suspect vaso-vagal vs related to dehydration , hypotension.  ICD  reviewed by cardiology, a fib.  Appreciate Heart failure team help/   Ischemic cardiomyopathy/chronic systolic CHF -decrease home dose lasix to 40 mg daily.  -IV fluids stopped.  -HF team will follow in consultation.  -resume hydralazine when BP allows it.   HTN SBP soft, hold for now: hydralazine, isosorbide, cozaar and spironolactone.   Chronic A. Fib -Rate controlled, therapeutic on Coumadin -Continue Coumadin per pharmacy continue with coreg.   HLD -LDL 162.  Will start Simvastatin.   Seizure -On Dilantin 100 mg TID. Will hold dilantin for now, level elevated.  -Dilantin level elevated at 32.---29.  Pharmacy will help with dilantin doses.   Mild elevated TSH. Await Free T 3 level low. . Free T 4 normal. eed repeat LFT in 4 weeks.   Left Knee pain;  Check X ray and uric acid.   Code Status: DNR Family Communication: care discussed  Disposition Plan: Remain inpatient. Awaiting decrease level of dilantin to  restart dilantin    Consultants:  Cardiology   Procedures:  echo  Antibiotics:  none  HPI/Subjective: He is feeling better, no more dizziness.  Complaining of left knee pain.   Objective: Filed Vitals:   02/27/16 2012 02/28/16 0134  BP: 102/63 111/70  Pulse: 65 61  Temp: 98.3 F (36.8 C) 98.7 F (37.1 C)  Resp: 18 18    Intake/Output Summary (Last 24 hours) at 02/28/16 1517 Last data filed at 02/28/16 1447  Gross per 24 hour  Intake   1795 ml  Output   2225 ml  Net   -430 ml   Filed Weights   02/26/16 1837 02/27/16 0126 02/28/16 0526  Weight: 103.329 kg (227 lb 12.8 oz) 101.651 kg (224 lb 1.6 oz) 102.649 kg (226 lb 4.8 oz)    Exam:   General:  Alert in no acute distress  Cardiovascular: S 1, S 2 RRR  Respiratory: CTA  Abdomen: Bs present, soft, nt  Musculoskeletal: no edema  Data Reviewed: Basic Metabolic Panel:  Recent Labs Lab 02/26/16 1906 02/26/16 2115 02/27/16 0134 02/27/16 0511 02/28/16 0513  NA 138  --   --  140 141  K 5.4*  --   --  4.0 4.6  CL 100*  --   --  104 103  CO2 26  --   --  27 27  GLUCOSE 85  --   --  122* 89  BUN 32*  --   --  32* 29*  CREATININE 1.65*  --  1.55* 1.52* 1.28*  CALCIUM 9.1  --   --  8.5*  9.0  MG  --  1.9 1.8  --   --    Liver Function Tests:  Recent Labs Lab 02/26/16 2115 02/27/16 0511  AST 27 26  ALT 18 16*  ALKPHOS 126 117  BILITOT 0.9 0.6  PROT 8.2* 6.7  ALBUMIN 3.8 3.3*   No results for input(s): LIPASE, AMYLASE in the last 168 hours. No results for input(s): AMMONIA in the last 168 hours. CBC:  Recent Labs Lab 02/26/16 1906 02/27/16 0134  WBC 4.7 4.6  HGB 15.1 14.0  HCT 45.3 42.9  MCV 87.1 87.9  PLT 137* 138*   Cardiac Enzymes:  Recent Labs Lab 02/27/16 0134 02/27/16 0511 02/27/16 0942  TROPONINI <0.03 <0.03 <0.03   BNP (last 3 results)  Recent Labs  10/04/15 1604 10/31/15 1551 12/01/15 1015  BNP 796.9* 53.9 70.1    ProBNP (last 3 results) No results for  input(s): PROBNP in the last 8760 hours.  CBG:  Recent Labs Lab 02/26/16 1835 02/26/16 2252 02/27/16 0001  GLUCAP 85 67 118*    Recent Results (from the past 240 hour(s))  MRSA PCR Screening     Status: None   Collection Time: 02/27/16  2:29 AM  Result Value Ref Range Status   MRSA by PCR NEGATIVE NEGATIVE Final    Comment:        The GeneXpert MRSA Assay (FDA approved for NASAL specimens only), is one component of a comprehensive MRSA colonization surveillance program. It is not intended to diagnose MRSA infection nor to guide or monitor treatment for MRSA infections.      Studies: Dg Chest 2 View  02/26/2016  CLINICAL DATA:  Weakness, fatigue, diaphoresis. EXAM: CHEST  2 VIEW COMPARISON:  10/13/2015 FINDINGS: Postoperative changes in the mediastinum. Cardiac pacemaker. Normal heart size and pulmonary vascularity. Atelectasis in the lung bases. No focal consolidation or airspace disease. No blunting of costophrenic angles. No pneumothorax. Tortuous aorta. Degenerative changes in the spine. IMPRESSION: Atelectasis in the lung bases. Electronically Signed   By: Lucienne Capers M.D.   On: 02/26/2016 22:38   Ct Head Wo Contrast  02/27/2016  CLINICAL DATA:  Weakness and near syncope. EXAM: CT HEAD WITHOUT CONTRAST TECHNIQUE: Contiguous axial images were obtained from the base of the skull through the vertex without intravenous contrast. COMPARISON:  09/26/2015 FINDINGS: Mild cerebral atrophy. Mild ventricular dilatation consistent with central atrophy. Patchy low-attenuation changes in the deep white matter consistent with small vessel ischemia. No mass effect or midline shift. No abnormal extra-axial fluid collections. Gray-white matter junctions are distinct. Basal cisterns are not effaced. No evidence of acute intracranial hemorrhage. No depressed skull fractures. Opacification of some of the right mastoid air cells. Mild mucosal thickening in the paranasal sinuses. Vascular  calcifications. IMPRESSION: No acute intracranial abnormalities. Mild chronic atrophy and small vessel ischemic changes similar to previous study. Electronically Signed   By: Lucienne Capers M.D.   On: 02/27/2016 01:21    Scheduled Meds: . amiodarone  200 mg Oral BID  . aspirin EC  81 mg Oral Daily  . carvedilol  3.125 mg Oral BID WC  . furosemide  40 mg Oral Daily  . magnesium oxide  200 mg Oral BID  . sodium chloride flush  3 mL Intravenous Q12H  . warfarin  2 mg Oral ONCE-1800  . Warfarin - Pharmacist Dosing Inpatient   Does not apply q1800   Continuous Infusions:   Principal Problem:   Near syncope Active Problems:   HTN (hypertension)   Ischemic  cardiomyopathy  ? 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     Niel Hummer A  [facility] Pager [phone]. If 7PM-7AM, please contact night-coverage at [email or web address], password [facility] 02/28/2016, 3:17 PM

## 2016-02-29 DIAGNOSIS — R55 Syncope and collapse: Secondary | ICD-10-CM | POA: Diagnosis not present

## 2016-02-29 DIAGNOSIS — I480 Paroxysmal atrial fibrillation: Secondary | ICD-10-CM | POA: Diagnosis not present

## 2016-02-29 DIAGNOSIS — I5022 Chronic systolic (congestive) heart failure: Secondary | ICD-10-CM | POA: Diagnosis not present

## 2016-02-29 LAB — BASIC METABOLIC PANEL
ANION GAP: 5 (ref 5–15)
BUN: 27 mg/dL — AB (ref 6–20)
CHLORIDE: 105 mmol/L (ref 101–111)
CO2: 28 mmol/L (ref 22–32)
Calcium: 8.7 mg/dL — ABNORMAL LOW (ref 8.9–10.3)
Creatinine, Ser: 1.27 mg/dL — ABNORMAL HIGH (ref 0.61–1.24)
GFR calc Af Amer: >60 mL/min (ref >60)
GFR calc non Af Amer: 57 mL/min — ABNORMAL LOW (ref >60)
GLUCOSE: 84 mg/dL (ref 65–99)
POTASSIUM: 4.7 mmol/L (ref 3.5–5.1)
Sodium: 138 mmol/L (ref 135–145)

## 2016-02-29 LAB — PHENYTOIN LEVEL, TOTAL: PHENYTOIN LVL: 24.8 ug/mL — AB (ref 10.0–20.0)

## 2016-02-29 LAB — PROTIME-INR
INR: 2.77 — ABNORMAL HIGH (ref 0.00–1.49)
Prothrombin Time: 28.8 seconds — ABNORMAL HIGH (ref 11.6–15.2)

## 2016-02-29 MED ORDER — FUROSEMIDE 40 MG PO TABS: 40.0000 mg | ORAL_TABLET | Freq: Every day | ORAL | Status: DC

## 2016-02-29 MED ORDER — COLCHICINE 0.6 MG PO TABS: 0.6000 mg | ORAL_TABLET | Freq: Every day | ORAL | Status: DC

## 2016-02-29 MED ORDER — PREDNISONE 20 MG PO TABS
40.0000 mg | ORAL_TABLET | Freq: Every day | ORAL | Status: DC
  Administered 2016-02-29: 40 mg via ORAL
  Filled 2016-02-29: qty 2

## 2016-02-29 MED ORDER — WARFARIN SODIUM 2 MG PO TABS: 2.0000 mg | ORAL_TABLET | ORAL | Status: DC

## 2016-02-29 MED ORDER — DICLOFENAC SODIUM 1 % TD GEL: 2.0000 g | Freq: Four times a day (QID) | TRANSDERMAL | Status: DC

## 2016-02-29 MED ORDER — SIMVASTATIN 20 MG PO TABS: 20.0000 mg | ORAL_TABLET | Freq: Every day | ORAL | Status: DC

## 2016-02-29 MED ORDER — WARFARIN SODIUM 2 MG PO TABS: 4.0000 mg | ORAL_TABLET | ORAL | Status: DC

## 2016-02-29 MED ORDER — AMIODARONE HCL 200 MG PO TABS: 200.0000 mg | ORAL_TABLET | Freq: Two times a day (BID) | ORAL | Status: DC

## 2016-02-29 MED ORDER — PHENYTOIN SODIUM EXTENDED 100 MG PO CAPS: 100.0000 mg | ORAL_CAPSULE | Freq: Two times a day (BID) | ORAL | Status: DC

## 2016-02-29 MED ORDER — MAGNESIUM OXIDE 400 (241.3 MG) MG PO TABS: 200.0000 mg | ORAL_TABLET | Freq: Two times a day (BID) | ORAL | Status: DC

## 2016-02-29 MED ORDER — PREDNISONE 20 MG PO TABS: 40.0000 mg | ORAL_TABLET | Freq: Every day | ORAL | Status: DC

## 2016-02-29 MED ORDER — COLCHICINE 0.6 MG PO TABS
0.6000 mg | ORAL_TABLET | Freq: Every day | ORAL | Status: DC
  Administered 2016-02-29: 0.6 mg via ORAL
  Filled 2016-02-29: qty 1

## 2016-02-29 MED ORDER — WARFARIN SODIUM 2 MG PO TABS: ORAL_TABLET | ORAL | Status: DC

## 2016-02-29 NOTE — Progress Notes (Signed)
Advanced Heart Failure Rounding Note   Subjective:    Denies dyspnea. Weight stable. Pending d/c   Objective:   Weight Range:  Vital Signs:   Temp:  [97.8 F (36.6 C)-98.6 F (37 C)] 97.8 F (36.6 C) (03/08 0538) Pulse Rate:  [58-64] 58 (03/08 0538) Resp:  [18] 18 (03/08 0538) BP: (101-118)/(61-76) 118/76 mmHg (03/08 0538) SpO2:  [99 %-100 %] 100 % (03/08 0538) Weight:  [102.649 kg (226 lb 4.8 oz)] 102.649 kg (226 lb 4.8 oz) (03/08 0538) Last BM Date: 02/27/16  Weight change: Filed Weights   02/27/16 0126 02/28/16 0526 02/29/16 0538  Weight: 101.651 kg (224 lb 1.6 oz) 102.649 kg (226 lb 4.8 oz) 102.649 kg (226 lb 4.8 oz)    Intake/Output:   Intake/Output Summary (Last 24 hours) at 02/29/16 0934 Last data filed at 02/29/16 0421  Gross per 24 hour  Intake    895 ml  Output   1325 ml  Net   -430 ml     Physical Exam: General:  Well appearing. No resp difficulty HEENT: normal Neck: supple. JVP 7-8 . Carotids 2+ bilat; no bruits. No lymphadenopathy or thryomegaly appreciated. Cor: PMI nondisplaced. Regular rate & rhythm. No rubs, gallops or murmurs. Lungs: clear Abdomen: soft, nontender, nondistended. No hepatosplenomegaly. No bruits or masses. Good bowel sounds. Extremities: no cyanosis, clubbing, rash, edema Neuro: alert & orientedx3, cranial nerves grossly intact. moves all 4 extremities w/o difficulty. Affect pleasant  Telemetry:  NSR  Labs: Basic Metabolic Panel:  Recent Labs Lab 02/26/16 1906 02/26/16 2115 02/27/16 0134 02/27/16 0511 02/28/16 0513 02/29/16 0500  NA 138  --   --  140 141 138  K 5.4*  --   --  4.0 4.6 4.7  CL 100*  --   --  104 103 105  CO2 26  --   --  27 27 28   GLUCOSE 85  --   --  122* 89 84  BUN 32*  --   --  32* 29* 27*  CREATININE 1.65*  --  1.55* 1.52* 1.28* 1.27*  CALCIUM 9.1  --   --  8.5* 9.0 8.7*  MG  --  1.9 1.8  --   --   --     Liver Function Tests:  Recent Labs Lab 02/26/16 2115 02/27/16 0511  AST 27  26  ALT 18 16*  ALKPHOS 126 117  BILITOT 0.9 0.6  PROT 8.2* 6.7  ALBUMIN 3.8 3.3*   No results for input(s): LIPASE, AMYLASE in the last 168 hours. No results for input(s): AMMONIA in the last 168 hours.  CBC:  Recent Labs Lab 02/26/16 1906 02/27/16 0134  WBC 4.7 4.6  HGB 15.1 14.0  HCT 45.3 42.9  MCV 87.1 87.9  PLT 137* 138*    Cardiac Enzymes:  Recent Labs Lab 02/27/16 0134 02/27/16 0511 02/27/16 0942  TROPONINI <0.03 <0.03 <0.03    BNP: BNP (last 3 results)  Recent Labs  10/04/15 1604 10/31/15 1551 12/01/15 1015  BNP 796.9* 53.9 70.1    ProBNP (last 3 results) No results for input(s): PROBNP in the last 8760 hours.    Other results:  Imaging: Dg Knee 1-2 Views Left  02/28/2016  CLINICAL DATA:  Pt twisted left knee 1 month ago, pain left knee EXAM: LEFT KNEE - 1-2 VIEW COMPARISON:  None available FINDINGS: Marginal spurs about the patellofemoral and lateral compartments. Narrowing of the articular cartilage in the medial compartment. No fracture or dislocation. Small effusion in  the suprapatellar bursa. Patchy popliteal arterial calcifications. IMPRESSION: 1. Negative for fracture or other acute bone abnormality. 2. Tricompartmental degenerative changes as above with small effusion. Electronically Signed   By: Lucrezia Europe M.D.   On: 02/28/2016 16:26     Medications:     Scheduled Medications: . amiodarone  200 mg Oral BID  . aspirin EC  81 mg Oral Daily  . carvedilol  3.125 mg Oral BID WC  . colchicine  0.6 mg Oral Daily  . diclofenac sodium  2 g Topical QID  . furosemide  40 mg Oral Daily  . magnesium oxide  200 mg Oral BID  . predniSONE  40 mg Oral Q breakfast  . simvastatin  20 mg Oral q1800  . sodium chloride flush  3 mL Intravenous Q12H  . [START ON 03/02/2016] warfarin  2 mg Oral Once per day on Mon Fri  . warfarin  4 mg Oral Once per day on Sun Tue Wed Thu Sat  . Warfarin - Pharmacist Dosing Inpatient   Does not apply q1800     Infusions:    PRN Medications: sodium chloride, acetaminophen, ondansetron (ZOFRAN) IV, sodium chloride flush   Assessment:   1. Near syncope/hypotension 2. Chronic systolic CHF 3. PAF - now back in NSR 4. HTN 5. Acute on chronic kidney failure stage III 6. Gout   Plan/Discussion:    Volume status looks good. No further AF. Ok to d/c today on current meds. Will see back next week in HF Clinic,    Length of Stay:    Glori Bickers MD 02/29/2016, 9:34 AM  Advanced Heart Failure Team Pager [phone] (M-F; 7a - 4p)  Please contact [facility] for night-coverage after hours (4p -7a ) and weekends on [email or web address]

## 2016-02-29 NOTE — Progress Notes (Signed)
Orders received for pt discharge.  Discharge summary printed and reviewed with pt.  Explained medication regimen, and pt had no further questions at this time.  IV removed and site remains clean, dry, intact.  Telemetry removed.  Pt in stable condition and awaiting transport. 

## 2016-02-29 NOTE — Progress Notes (Signed)
R/t discharge, pt states that his transportation is unable to pick him up until 1700.

## 2016-02-29 NOTE — Progress Notes (Signed)
ANTICOAGULATION CONSULT NOTE - Follow Up Consult  Pharmacy Consult for Coumadin Indication: atrial fibrillation  No Known Allergies  Patient Measurements: Height: 5\' 10"  (177.8 cm) Weight: 226 lb 4.8 oz (102.649 kg) IBW/kg (Calculated) : 73  Vital Signs: Temp: 97.8 F (36.6 C) (03/08 0538) Temp Source: Oral (03/08 0538) BP: 118/76 mmHg (03/08 0538) Pulse Rate: 58 (03/08 0538)  Labs:  Recent Labs  02/26/16 1906  02/27/16 0134 02/27/16 0511 02/27/16 0942 02/28/16 0513 02/29/16 0500  HGB 15.1  --  14.0  --   --   --   --   HCT 45.3  --  42.9  --   --   --   --   PLT 137*  --  138*  --   --   --   --   LABPROT  --   < >  --  34.3*  --  32.0* 28.8*  INR  --   < >  --  3.48*  --  3.18* 2.77*  CREATININE 1.65*  --  1.55* 1.52*  --  1.28* 1.27*  TROPONINI  --   --  <0.03 <0.03 <0.03  --   --   < > = values in this interval not displayed.  Estimated Creatinine Clearance: 67.7 mL/min (by C-G formula based on Cr of 1.27).   Medical History: Past Medical History  Diagnosis Date  . CAD (coronary artery disease)   . Chronic systolic heart failure ([place])   . Ischemic cardiomyopathy     EF 15 to 20% by TTE and TEE in 09/2012.  Severe LV dysfunction  . HTN (hypertension)   . Hyperlipidemia   . Obesity     BMI 31 in 09/2012  . Atrial fibrillation   09/22/2012  . implantable cardiac defibrillator-Biotronik     Device Implanted 2006; s/p gen change 03/2011 : bleeding persistent with pocket erosion and infection; explant and reimplant  06/2011  . Stroke ([place])   . 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 History of MRSA infection 05/2011  . Seizure disorder ([place]) latest 09/30/2012  . 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 Seizures [facility])      Assessment: 70 y.o. male admitted with AMS/weakness, h/o Afib, to continue Coumadin. INR 3.48>3.18>2.77 after holding dose and then a 2mg  tad last night.  Anticipate INR still dropping some after holding dose  Will restart but at a lower  home dose. Plan D/C today  PTA Coumadin dose: 4 mg daily except 2 mg Sat (last dose 3/5)  Goal of Therapy:  INR 2-3 Monitor platelets by anticoagulation protocol: Yes   Plan:  New home dose Warfarin 2mg  M and F/4mg  all other days Daily INR  Bonnita Nasuti Pharm.D. CPP, BCPS Clinical Pharmacist [phone] 02/29/2016 9:03 AM

## 2016-02-29 NOTE — Discharge Summary (Signed)
Physician Discharge Summary  [facility]. [id number] DOB: 1946-04-29 DOA: 02/26/2016  PCP: [name], MD  Admit date: 02/26/2016 Discharge date: 02/29/2016  Time spent: 35 minutes  Recommendations for Outpatient Follow-up:  Needs Dilantin level on Monday 30-10.  Follow up resolution of pseudo-gout.  Needs Follow up on INR Needs Thyroid function test check in 4 weeks. Patient on Amiodarone.    Discharge Diagnoses:    Near syncope   Dilantin level elevated.    Pseudo-gout.    HTN (hypertension)   Ischemic cardiomyopathy  ? 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 is a 70 y.o. 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 First episode was while at [facility]- he felt globally weak, diaphoretic and "not right". He denies any pain or seizure activity. No vomiting or diarrhea. He reports feeling as though he has been eating well, endorses a strong odor to his urine. EMS came and evaluated him and felt that he was safe for home. He went to church when the episode happened again and this time he decided to come to the ER. He did not feel his Biotronik fire.  Hospital Course:  1-Near Syncope:  Suspect vaso-vagal vs related to dehydration , hypotension.  ICD  reviewed by cardiology, a fib.  Appreciate Heart failure team help/  BO medication adjusted. Hydralazine ACE, spironolactone on hold.   Ischemic cardiomyopathy/chronic systolic CHF -decrease home dose lasix to 40 mg daily.  -IV fluids stopped.  -HF team will follow in consultation.   HTN SBP soft, hold for now: hydralazine, isosorbide, cozaar and spironolactone.   Chronic A. Fib -Rate controlled, therapeutic on Coumadin -Continue Coumadin per pharmacy continue with coreg.   HLD -LDL 162.  started Simvastatin.   Seizure -Dilantin level elevated on admission at 32. We have been holding dilantin during admission. His level has decrease to 24, but still elevated.  Discussed with pharmacist, will instruct patient to resume dilantin on Friday.  I will arrange a Nurse to check dilantin level on Monday and call results to PCP/   Mild elevated TSH,  Free T 3 level low. . Free T 4 normal. eed repeat LFT in 4 weeks.   Left Knee pain;  X ray mild effusion, arthritis.  Will treat for pseudo gout. Start colchicine, prednisone for 3 days.   Procedures:  none  Consultations:  HF team.   Discharge Exam: Filed Vitals:   02/28/16 2035 02/29/16 0538  BP: 101/61 118/76  Pulse: 64 58  Temp: 98.6 F (37 C) 97.8 F (36.6 C)  Resp: 18 18    General: NAD Cardiovascular: S 1, S 2 RRR Respiratory: CTA  Discharge Instructions   Discharge Instructions    AMB Referral to [place] Management    Complete by:  As directed   Reason for  consult:  Seizure disorder and prior [facility] Patient for post hospital monitoring  Expected date of contact:  1-3 days (reserved for hospital discharges)     Amb Referral to HF Clinic    Complete by:  As directed      Diet - low sodium heart healthy    Complete by:  As directed      Increase activity slowly    Complete by:  As directed           Current Discharge Medication List    START taking these medications   Details  colchicine 0.6 MG  tablet Take 1 tablet (0.6 mg total) by mouth daily. Qty: 30 tablet, Refills: 0    diclofenac sodium (VOLTAREN) 1 % GEL Apply 2 g topically 4 (four) times daily. Qty: 2 g, Refills: 0    furosemide (LASIX) 40 MG tablet Take 1 tablet (40 mg total) by mouth daily. Qty: 30 tablet, Refills: 0    magnesium oxide (MAG-OX) 400 (241.3 Mg) MG tablet Take 0.5 tablets (200 mg total) by mouth 2 (two) times daily. Qty: 10 tablet, Refills: 0    predniSONE (DELTASONE) 20 MG tablet Take 2 tablets (40 mg total) by mouth daily with breakfast. Qty: 6 tablet, Refills: 0    simvastatin (ZOCOR) 20 MG tablet Take 1 tablet (20 mg total) by mouth daily at 6 PM. Qty: 30 tablet, Refills: 0    warfarin (COUMADIN) 2 MG tablet Take one tablet Mondays and fridays. Take 2 tablet on Sun, Tue, Wed, Thu, Sat. Qty: 30 tablet, Refills: 0      CONTINUE these medications which have CHANGED   Details  amiodarone (PACERONE) 200 MG tablet Take 1 tablet (200 mg total) by mouth 2 (two) times daily. Qty: 60 tablet, Refills: 0    phenytoin (DILANTIN) 100 MG ER capsule Take 1 capsule (100 mg total) by mouth 2 (two) times daily. Qty: 60 capsule, Refills: 0      CONTINUE these medications which have NOT CHANGED   Details  acetaminophen (TYLENOL) 325 MG tablet Take 2 tablets (650 mg total) by mouth every 6 (six) hours as needed for mild pain. Qty: 100 tablet, Refills: 6    carvedilol (COREG) 3.125 MG tablet Take 1 tablet (3.125 mg total) by mouth 2 (two) times daily. Qty: 60 tablet, Refills: 6       No Known Allergies Follow-up Information    Follow up with Glori Bickers, MD On 03/08/2016.   Specialty:  Cardiology   Why:  at 1100 for post hospital follow up.  Please bring all of your medications to your visit. The code for parking is 0010.    Contact information:   [address] [place] [address] [phone]       Follow up with [facility].   Specialty:  [place]   Why:  They will do your home health care at your home   Contact information:   [place] [address] [phone]       Follow up with [name], MD In 1 week.   Specialty:  Internal Medicine   Contact information:   [place] [address] [phone]        The results of significant diagnostics from this hospitalization (including imaging, microbiology, ancillary and laboratory) are listed below for reference.    Significant Diagnostic Studies: Dg Chest 2 View  02/26/2016  CLINICAL  DATA:  Weakness, fatigue, diaphoresis. EXAM: CHEST  2 VIEW COMPARISON:  10/13/2015 FINDINGS: Postoperative changes in the mediastinum. Cardiac pacemaker. Normal heart size and pulmonary vascularity. Atelectasis in the lung bases. No focal consolidation or airspace disease. No blunting of costophrenic angles. No pneumothorax. Tortuous aorta. Degenerative changes in the spine. IMPRESSION: Atelectasis in the lung bases. Electronically Signed   By: Lucienne Capers M.D.   On: 02/26/2016 22:38   Dg Knee 1-2 Views Left  02/28/2016  CLINICAL DATA:  Pt twisted left knee 1 month ago, pain left knee EXAM: LEFT KNEE - 1-2 VIEW COMPARISON:  None available FINDINGS: Marginal spurs about the patellofemoral and lateral compartments. Narrowing of the articular cartilage in the medial compartment. No fracture or dislocation. Small effusion in the suprapatellar bursa. Patchy popliteal arterial calcifications. IMPRESSION: 1. Negative for fracture or other acute bone abnormality. 2. Tricompartmental degenerative changes as above with small effusion. Electronically Signed   By: Lucrezia Europe M.D.   On: 02/28/2016 16:26   Ct Head Wo Contrast  02/27/2016  CLINICAL DATA:  Weakness and near syncope. EXAM: CT HEAD WITHOUT CONTRAST TECHNIQUE: Contiguous axial images were obtained from the base of the skull through the vertex without intravenous contrast. COMPARISON:   09/26/2015 FINDINGS: Mild cerebral atrophy. Mild ventricular dilatation consistent with central atrophy. Patchy low-attenuation changes in the deep white matter consistent with small vessel ischemia. No mass effect or midline shift. No abnormal extra-axial fluid collections. Gray-white matter junctions are distinct. Basal cisterns are not effaced. No evidence of acute intracranial hemorrhage. No depressed skull fractures. Opacification of some of the right mastoid air cells. Mild mucosal thickening in the paranasal sinuses. Vascular calcifications. IMPRESSION: No acute intracranial abnormalities. Mild chronic atrophy and small vessel ischemic changes similar to previous study. Electronically Signed   By: Lucienne Capers M.D.   On: 02/27/2016 01:21    Microbiology: Recent Results (from the past 240 hour(s))  MRSA PCR Screening     Status: None   Collection Time: 02/27/16  2:29 AM  Result Value Ref Range Status   MRSA by PCR NEGATIVE NEGATIVE Final    Comment:        The GeneXpert MRSA Assay (FDA approved for NASAL specimens only), is one component of a comprehensive MRSA colonization surveillance program. It is not intended to diagnose MRSA infection nor to guide or monitor treatment for MRSA infections.      Labs: Basic Metabolic Panel:  Recent Labs Lab 02/26/16 1906 02/26/16 2115 02/27/16 0134 02/27/16 0511 02/28/16 0513 02/29/16 0500  NA 138  --   --  140 141 138  K 5.4*  --   --  4.0 4.6 4.7  CL 100*  --   --  104 103 105  CO2 26  --   --  27 27 28   GLUCOSE 85  --   --  122* 89 84  BUN 32*  --   --  32* 29* 27*  CREATININE 1.65*  --  1.55* 1.52* 1.28* 1.27*  CALCIUM 9.1  --   --  8.5* 9.0 8.7*  MG  --  1.9 1.8  --   --   --    Liver Function Tests:  Recent Labs Lab 02/26/16 2115 02/27/16 0511  AST 27 26  ALT 18 16*  ALKPHOS 126 117  BILITOT 0.9 0.6  PROT 8.2* 6.7  ALBUMIN 3.8 3.3*   No results for input(s): LIPASE, AMYLASE in the last 168 hours. No results  for input(s): AMMONIA in  the last 168 hours. CBC:  Recent Labs Lab 02/26/16 1906 02/27/16 0134  WBC 4.7 4.6  HGB 15.1 14.0  HCT 45.3 42.9  MCV 87.1 87.9  PLT 137* 138*   Cardiac Enzymes:  Recent Labs Lab 02/27/16 0134 02/27/16 0511 02/27/16 0942  TROPONINI <0.03 <0.03 <0.03   BNP: BNP (last 3 results)  Recent Labs  10/04/15 1604 10/31/15 1551 12/01/15 1015  BNP 796.9* 53.9 70.1    ProBNP (last 3 results) No results for input(s): PROBNP in the last 8760 hours.  CBG:  Recent Labs Lab 02/26/16 1835 02/26/16 2252 02/27/16 0001  GLUCAP 85 67 118*       Signed:  Niel Hummer A MD.  [facility] 02/29/2016, 9:14 AM

## 2016-02-29 NOTE — Discharge Instructions (Signed)
Please start Taking Dilantin on Friday 3-10. Your dose was decrease to twice a day.   Please follow with coumadin clinic.   Information on my medicine - Coumadin   (Warfarin)  This medication education was reviewed with me or my healthcare representative as part of my discharge preparation.  Why was Coumadin prescribed for you?  Coumadin was prescribed for you because you have a blood clot or a medical condition that can cause an increased risk of forming blood clots. Blood clots can cause serious health problems by blocking the flow of blood to the heart, lung, or brain. Coumadin can prevent harmful blood clots from forming. As a reminder your indication for Coumadin is:   Stroke Prevention Because Of Atrial Fibrillation  What test will check on my response to Coumadin? While on Coumadin (warfarin) you will need to have an INR test regularly to ensure that your dose is keeping you in the desired range. The INR (international normalized ratio) number is calculated from the result of the laboratory test called prothrombin time (PT).  If an INR APPOINTMENT HAS NOT ALREADY BEEN MADE FOR YOU please schedule an appointment to have this lab work done by your health care provider within 7 days. Your INR goal is usually a number between:  2 to 3   What  do you need to  know  About  COUMADIN? Take Coumadin (warfarin) exactly as prescribed by your healthcare provider about the same time each day.  DO NOT stop taking without talking to the doctor who prescribed the medication.  Stopping without other blood clot prevention medication to take the place of Coumadin may increase your risk of developing a new clot or stroke.  Get refills before you run out.  What do you do if you miss a dose? If you miss a dose, take it as soon as you remember on the same day then continue your regularly scheduled regimen the next day.  Do not take two doses of Coumadin at the same time.  Important Safety Information A  possible side effect of Coumadin (Warfarin) is an increased risk of bleeding. You should call your healthcare provider right away if you experience any of the following: ? Bleeding from an injury or your nose that does not stop. ? Unusual colored urine (red or dark brown) or unusual colored stools (red or black). ? Unusual bruising for unknown reasons. ? A serious fall or if you hit your head (even if there is no bleeding).  Some foods or medicines interact with Coumadin (warfarin) and might alter your response to warfarin. To help avoid this: ? Eat a balanced diet, maintaining a consistent amount of Vitamin K. ? Notify your provider about major diet changes you plan to make. ? Avoid alcohol or limit your intake to 1 drink for women and 2 drinks for men per day. (1 drink is 5 oz. wine, 12 oz. beer, or 1.5 oz. liquor.)  Make sure that ANY health care provider who prescribes medication for you knows that you are taking Coumadin (warfarin).  Also make sure the healthcare provider who is monitoring your Coumadin knows when you have started a new medication including herbals and non-prescription products.  Coumadin (Warfarin)  Major Drug Interactions  Increased Warfarin Effect Decreased Warfarin Effect  Alcohol (large quantities) Antibiotics (esp. Septra/Bactrim, Flagyl, Cipro) Amiodarone (Cordarone) Aspirin (ASA) Cimetidine (Tagamet) Megestrol (Megace) NSAIDs (ibuprofen, naproxen, etc.) Piroxicam (Feldene) Propafenone (Rythmol SR) Propranolol (Inderal) Isoniazid (INH) Posaconazole (Noxafil) Barbiturates (Phenobarbital) Carbamazepine (Tegretol) Chlordiazepoxide (  Librium) Cholestyramine (Questran) Griseofulvin Oral Contraceptives Rifampin Sucralfate (Carafate) Vitamin K   Coumadin (Warfarin) Major Herbal Interactions  Increased Warfarin Effect Decreased Warfarin Effect  Garlic Ginseng Ginkgo biloba Coenzyme Q10 Green tea St. Johns wort    Coumadin (Warfarin) FOOD  Interactions  Eat a consistent number of servings per week of foods HIGH in Vitamin K (1 serving =  cup)  Collards (cooked, or boiled & drained) Kale (cooked, or boiled & drained) Mustard greens (cooked, or boiled & drained) Parsley *serving size only =  cup Spinach (cooked, or boiled & drained) Swiss chard (cooked, or boiled & drained) Turnip greens (cooked, or boiled & drained)  Eat a consistent number of servings per week of foods MEDIUM-HIGH in Vitamin K (1 serving = 1 cup)  Asparagus (cooked, or boiled & drained) Broccoli (cooked, boiled & drained, or raw & chopped) Brussel sprouts (cooked, or boiled & drained) *serving size only =  cup Lettuce, raw (green leaf, endive, romaine) Spinach, raw  Turnip greens, raw & chopped   These websites have more information on Coumadin (warfarin):  FailFactory.se; VeganReport.com.au;

## 2016-03-01 ENCOUNTER — Telehealth: Payer: Self-pay | Admitting: *Deleted

## 2016-03-01 ENCOUNTER — Other Ambulatory Visit: Payer: Self-pay | Admitting: *Deleted

## 2016-03-01 NOTE — Patient Outreach (Signed)
[facility]) Care Management  03/01/2016  [facility]. December 16, 1946 [id number]   Transition of care ( week 1)  RN attempted outreach call today however no answer and Rn unable to leave a message. Pt discharged from the hospital on yesterday. RN will continue outreach attempts for possible services. Note pt was recently enrolled into the [facility]  program and properly discharged with met goals. The plan at that time for pt to move into another area complex with his current roommate. RN unaware if pt has successful moved from his previous residence. Again RN will continue outreach attempts.  Raina Mina, RN Care Management Coordinator [place] Office [phone]

## 2016-03-01 NOTE — Telephone Encounter (Signed)
Transition Care Management Follow-up Telephone Call   Date discharged? 02/29/16   How have you been since you were released from the hospital? Pt states he is feeling fine   Do you understand why you were in the hospital? YES   Do you understand the discharge instructions? YES   Where were you discharged to? Home   Items Reviewed:  Medications reviewed: YES  Allergies reviewed: YES  Dietary changes reviewed: YES  Referrals reviewed: Yes, he states have appt to see his cardiologist next week   Functional Questionnaire:   Activities of Daily Living (ADLs):   He states he are independent in the following: ambulation, bathing and hygiene, feeding, continence, grooming, toileting and dressing States he doesn't require assistance   Any transportation issues/concerns?: NO   Any patient concerns? NO   Confirmed importance and date/time of follow-up visits scheduled YES, appt 03/19/16  Provider Appointment booked with Dr. Sharlet Salina  Confirmed with patient if condition begins to worsen call PCP or go to the ER.  Patient was given the office number and encouraged to call back with question or concerns.  : YES

## 2016-03-02 DIAGNOSIS — Z87891 Personal history of nicotine dependence: Secondary | ICD-10-CM | POA: Diagnosis not present

## 2016-03-02 DIAGNOSIS — Z950 Presence of cardiac pacemaker: Secondary | ICD-10-CM | POA: Diagnosis not present

## 2016-03-02 DIAGNOSIS — I5022 Chronic systolic (congestive) heart failure: Secondary | ICD-10-CM | POA: Diagnosis not present

## 2016-03-02 DIAGNOSIS — Z7952 Long term (current) use of systemic steroids: Secondary | ICD-10-CM | POA: Diagnosis not present

## 2016-03-02 DIAGNOSIS — E785 Hyperlipidemia, unspecified: Secondary | ICD-10-CM | POA: Diagnosis not present

## 2016-03-02 DIAGNOSIS — I255 Ischemic cardiomyopathy: Secondary | ICD-10-CM | POA: Diagnosis not present

## 2016-03-02 DIAGNOSIS — I110 Hypertensive heart disease with heart failure: Secondary | ICD-10-CM | POA: Diagnosis not present

## 2016-03-02 DIAGNOSIS — Z7901 Long term (current) use of anticoagulants: Secondary | ICD-10-CM | POA: Diagnosis not present

## 2016-03-02 DIAGNOSIS — I482 Chronic atrial fibrillation: Secondary | ICD-10-CM | POA: Diagnosis not present

## 2016-03-02 DIAGNOSIS — I2510 Atherosclerotic heart disease of native coronary artery without angina pectoris: Secondary | ICD-10-CM | POA: Diagnosis not present

## 2016-03-02 DIAGNOSIS — M1991 Primary osteoarthritis, unspecified site: Secondary | ICD-10-CM | POA: Diagnosis not present

## 2016-03-06 ENCOUNTER — Other Ambulatory Visit: Payer: Self-pay | Admitting: *Deleted

## 2016-03-06 NOTE — Patient Outreach (Addendum)
[facility] [facility]) Care Management  03/06/2016  [facility]. [birth year] [id number]  Transition of care (week 2)  RN spoke with pt today who indicated his involvement with [employer] (pt unable to recite the name of the involved agency) and possible DME he may receive for monitoring his weight and BP. Pt has indicated the agency has visited and will continue to come weekly (not sure of the next visit). Pt was recent discharged via [facility] under the HF program however this admission related to seizure and pt states dehydration. Pt reports how much fluid he is able to consume and his weights over the last two days were 229 lbs and last week 234 lbs. Denies any swelling and indicates he is in the GREEN zone. RN continued to review the HF zone with 3 lbs overnight and 5 lbs within one week and what to do if acute symptoms should occur. Pt has a regular visit with his primary doctor in April and his CAD this week on 3/16. RN offered to contact his provider concerning his recent hospitalization in case he wishes to see pt sooner then April. Pt states he will call and alert his primary doctor of his recent hospital admission. Transition of care template completed as pt verified his is taking his medications as prescribed. RN offered [facility] services for home visits to resume and a follow up with a call next week to inquire further. Pt was planning to move from the currently location however not sure when his caregiver will arrange this move out date. Therefore pt receptive to a telephone call next week as RN will inquire further on potential needs at that time with the involvement of [employer] and monitoring equipment.    Raina Mina, RN Care Management Coordinator [place] Office [phone]

## 2016-03-06 NOTE — Patient Outreach (Signed)
[facility]) Care Management  03/06/2016  [facility]. 01/16/1946 [id number]   Patient triggered RED on EMMI Heart Failure dashboard, notification sent to Raina Mina, RN.  Thanks, Ronnell Freshwater. Gayle Mill, [place] Assistant Phone: [phone] Fax: [phone]

## 2016-03-07 ENCOUNTER — Other Ambulatory Visit (HOSPITAL_COMMUNITY): Payer: Self-pay | Admitting: *Deleted

## 2016-03-07 ENCOUNTER — Telehealth: Payer: Self-pay | Admitting: Internal Medicine

## 2016-03-07 MED ORDER — FUROSEMIDE 40 MG PO TABS: 40.0000 mg | ORAL_TABLET | Freq: Every day | ORAL | Status: DC

## 2016-03-07 NOTE — Telephone Encounter (Signed)
Louis Ford from [facility] requesting verbal orders for physical therapy 2 x for 8 wks for strenthing and balance. Please call her at [place] to leave vm

## 2016-03-08 ENCOUNTER — Ambulatory Visit (HOSPITAL_COMMUNITY): Payer: Medicare Other

## 2016-03-08 ENCOUNTER — Ambulatory Visit (INDEPENDENT_AMBULATORY_CARE_PROVIDER_SITE_OTHER): Payer: Medicare Other | Admitting: Pharmacist

## 2016-03-08 ENCOUNTER — Ambulatory Visit (HOSPITAL_COMMUNITY)
Admission: RE | Admit: 2016-03-08 | Discharge: 2016-03-08 | Disposition: A | Discharge status: Home / Self Care | Payer: Medicare Other | Source: Ambulatory Visit | Attending: Internal Medicine | Admitting: Internal Medicine

## 2016-03-08 VITALS — BP 122/74 | HR 79 | Wt 228.5 lb

## 2016-03-08 DIAGNOSIS — N183 Chronic kidney disease, stage 3 (moderate): Secondary | ICD-10-CM | POA: Insufficient documentation

## 2016-03-08 DIAGNOSIS — Z7901 Long term (current) use of anticoagulants: Secondary | ICD-10-CM | POA: Diagnosis not present

## 2016-03-08 DIAGNOSIS — G40909 Epilepsy, unspecified, not intractable, without status epilepticus: Secondary | ICD-10-CM | POA: Insufficient documentation

## 2016-03-08 DIAGNOSIS — I5022 Chronic systolic (congestive) heart failure: Secondary | ICD-10-CM | POA: Diagnosis not present

## 2016-03-08 DIAGNOSIS — E785 Hyperlipidemia, unspecified: Secondary | ICD-10-CM | POA: Diagnosis not present

## 2016-03-08 DIAGNOSIS — E669 Obesity, unspecified: Secondary | ICD-10-CM | POA: Diagnosis not present

## 2016-03-08 DIAGNOSIS — Z87891 Personal history of nicotine dependence: Secondary | ICD-10-CM | POA: Insufficient documentation

## 2016-03-08 DIAGNOSIS — Z79899 Other long term (current) drug therapy: Secondary | ICD-10-CM | POA: Insufficient documentation

## 2016-03-08 DIAGNOSIS — I10 Essential (primary) hypertension: Secondary | ICD-10-CM

## 2016-03-08 DIAGNOSIS — Z8673 Personal history of transient ischemic attack (TIA), and cerebral infarction without residual deficits: Secondary | ICD-10-CM | POA: Insufficient documentation

## 2016-03-08 DIAGNOSIS — I255 Ischemic cardiomyopathy: Secondary | ICD-10-CM | POA: Diagnosis not present

## 2016-03-08 DIAGNOSIS — Z8249 Family history of ischemic heart disease and other diseases of the circulatory system: Secondary | ICD-10-CM | POA: Diagnosis not present

## 2016-03-08 DIAGNOSIS — M109 Gout, unspecified: Secondary | ICD-10-CM | POA: Diagnosis not present

## 2016-03-08 DIAGNOSIS — Z951 Presence of aortocoronary bypass graft: Secondary | ICD-10-CM | POA: Diagnosis not present

## 2016-03-08 DIAGNOSIS — D682 Hereditary deficiency of other clotting factors: Secondary | ICD-10-CM | POA: Insufficient documentation

## 2016-03-08 DIAGNOSIS — I2510 Atherosclerotic heart disease of native coronary artery without angina pectoris: Secondary | ICD-10-CM | POA: Insufficient documentation

## 2016-03-08 DIAGNOSIS — R55 Syncope and collapse: Secondary | ICD-10-CM | POA: Diagnosis not present

## 2016-03-08 DIAGNOSIS — Z5181 Encounter for therapeutic drug level monitoring: Secondary | ICD-10-CM

## 2016-03-08 DIAGNOSIS — I130 Hypertensive heart and chronic kidney disease with heart failure and stage 1 through stage 4 chronic kidney disease, or unspecified chronic kidney disease: Secondary | ICD-10-CM | POA: Insufficient documentation

## 2016-03-08 DIAGNOSIS — F819 Developmental disorder of scholastic skills, unspecified: Secondary | ICD-10-CM | POA: Diagnosis not present

## 2016-03-08 DIAGNOSIS — Z95810 Presence of automatic (implantable) cardiac defibrillator: Secondary | ICD-10-CM | POA: Diagnosis not present

## 2016-03-08 DIAGNOSIS — N289 Disorder of kidney and ureter, unspecified: Secondary | ICD-10-CM

## 2016-03-08 DIAGNOSIS — I2583 Coronary atherosclerosis due to lipid rich plaque: Secondary | ICD-10-CM

## 2016-03-08 DIAGNOSIS — I480 Paroxysmal atrial fibrillation: Secondary | ICD-10-CM | POA: Diagnosis not present

## 2016-03-08 LAB — BASIC METABOLIC PANEL
Anion gap: 10 (ref 5–15)
BUN: 23 mg/dL — AB (ref 6–20)
CALCIUM: 9.3 mg/dL (ref 8.9–10.3)
CHLORIDE: 102 mmol/L (ref 101–111)
CO2: 25 mmol/L (ref 22–32)
CREATININE: 1.28 mg/dL — AB (ref 0.61–1.24)
GFR calc non Af Amer: 56 mL/min — ABNORMAL LOW (ref >60)
Glucose, Bld: 89 mg/dL (ref 65–99)
Potassium: 5.1 mmol/L (ref 3.5–5.1)
Sodium: 137 mmol/L (ref 135–145)

## 2016-03-08 LAB — PROTIME-INR
INR: 4.32 — ABNORMAL HIGH (ref 0.00–1.49)
Prothrombin Time: 40.2 seconds — ABNORMAL HIGH (ref 11.6–15.2)

## 2016-03-08 LAB — BRAIN NATRIURETIC PEPTIDE: B Natriuretic Peptide: 176.3 pg/mL — ABNORMAL HIGH (ref 0.0–100.0)

## 2016-03-08 NOTE — Addendum Note (Signed)
Encounter addended by: Shirley Friar, PA-C on: 03/08/2016 12:49 PM<BR>     Documentation filed: Notes Section

## 2016-03-08 NOTE — Progress Notes (Addendum)
Patient ID: Louis Dandy., male   DOB: 12-13-1946, 70 y.o.   MRN: [id number]    Advanced Heart Failure Clinic Note   Primary Care: Dr Pricilla Holm Primary Cardiologist: Dr Lovena Le Primary HF: Dr. Haroldine Laws  HPI: [facility] Louis Bonito. is a 70 y.o. male with hx of CAD s/p CABG, ICM s/p Biotronik ICD, chronic systolic CHF Echo 99991111 LVEF 30-35%; normal RV; PA peak pressure 34 mm Hg, HTN, HLD, atrial fibrillation, previous stroke, Factor 7 deficiency, and seizure disorder who presented to [facility] office on 10/04/15 with worsening SOB and Afib RVR and was admitted for same with 40 lb weight gain over the past year.   He diuresed well initially on IV lasix 40 mg BID, but continued to have SOB and was thus on milrinoneon 10/14 for concerns of low output. RHC on 10/12/15 that showed depressed cardiac output with Fick CO/CI 3.6/1.6. Improved on milrinone and remained stable with wean with CO-OX 60-70%. Bidil started and increase to goal dose.Also started on amio for afib rate control. D/c'd to [facility]. Overall out 7.6 L and down 15 lbs. D/c weight 229 lbs.  Echo 10/05/15 LVEF 30-35%, Mild MR, PA peak pressure 34 mmHg  RHC 10/12/15 RA = 7 RV = 28/1/8 PA = 32/15 (20) PCW = 12 Fick cardiac output/index = 3.6/1.6 Thermo cardiac output/index = 3.5/1.5 PVR = 2.2 WU PA sat = 44%, 47% Ao sat = 98%  Admitted 02/26/16 after two near syncopal episodes. ICD interrogation negative for any acute events. Suspect related to overdiuresis and hypotension with recurrent Afib.  Spontaneously converted to NSR without intervention. Chronic lasix dosing decreased to 40 mg daily and amio increased to 200 mg BID.   Echo 02/27/16 LVEF 55-60%, Grade 2 DD dysfunction. LAE mildly dilated, PA peak pressure 35 mm Hg.   He returns today for post hospital follow up. Overall doing well. Up 2 lbs from discharge (was thought to be low).  No further near syncopal events. Watching fluid and salt. Not SOB walking around  grocery store.  Keeps feet elevated at night.  No edema in mornings with mild edema in evenings. He had only been taking his amiodarone once daily, as previously directed. Gets a lot of help from his church.  Colchine was recently >$100 from [facility].  Labs: 12/30/15 K 5.0, creatinine 1.3 Labs: 02/29/16 K 4.7, creatinine 1.27   Past Medical History  Diagnosis Date  . CAD (coronary artery disease)   . Chronic systolic heart failure ([place])   . Ischemic cardiomyopathy     EF 15 to 20% by TTE and TEE in 09/2012.  Severe LV dysfunction  . HTN (hypertension)   . Hyperlipidemia   . Obesity     BMI 31 in 09/2012  . Atrial fibrillation   09/22/2012  . implantable cardiac defibrillator-Biotronik     Device Implanted 2006; s/p gen change 03/2011 : bleeding persistent with pocket erosion and infection; explant and reimplant  06/2011  . Stroke ([place])   . 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 History of MRSA infection 05/2011  . Seizure disorder ([place]) latest 09/30/2012  . 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 Seizures ([place])     Current Outpatient Prescriptions  Medication Sig Dispense Refill  . acetaminophen (TYLENOL) 325 MG tablet Take 2 tablets (650 mg total) by mouth every 6 (six) hours as needed for mild pain. 100 tablet 6  . amiodarone (PACERONE) 200 MG tablet Take 200 mg by mouth daily.    [name]  carvedilol (COREG) 3.125 MG tablet Take 1 tablet (3.125 mg total) by mouth 2 (two) times daily. 60 tablet 6  . colchicine 0.6 MG tablet Take 1 tablet (0.6 mg total) by mouth daily. 30 tablet 0  . diclofenac sodium (VOLTAREN) 1 % GEL Apply 2 g topically 4 (four) times daily. 2 g 0  . furosemide (LASIX) 40 MG tablet Take 1 tablet (40 mg total) by mouth daily. 30 tablet 3  . magnesium oxide (MAG-OX) 400 (241.3 Mg) MG tablet Take 0.5 tablets (200 mg total) by mouth 2 (two) times daily. 10 tablet 0  . phenytoin (DILANTIN) 100 MG ER capsule Take 1 capsule (100 mg total) by mouth 2 (two) times daily. 60 capsule 0   . simvastatin (ZOCOR) 20 MG tablet Take 1 tablet (20 mg total) by mouth daily at 6 PM. 30 tablet 0  . spironolactone (ALDACTONE) 25 MG tablet Take 12.5 mg by mouth daily.    [name] warfarin (COUMADIN) 2 MG tablet Take one tablet Mondays and fridays. Take 2 tablet on Sun, Tue, Wed, Thu, Sat. 30 tablet 0   No current facility-administered medications for this encounter.    No Known Allergies    Social History   Social History  . Marital Status: Single    Spouse Name: N/A  . Number of Children: N/A  . Years of Education: N/A   Occupational History  . Not on file.   Social History Main Topics  . Smoking status: Former Smoker    Types: Pipe    Quit date: 09/21/2008  . Smokeless tobacco: Former Systems developer    Quit date: 09/21/2008  . Alcohol Use: No  . Drug Use: No  . Sexual Activity: No   Other Topics Concern  . Not on file   Social History Narrative   ** Merged History Encounter **          Family History  Problem Relation Age of Onset  . Arthritis Mother   . Heart disease Mother   . Other Father     smoker  . Heart attack Mother   . Hypertension Neg Hx     unknown  . Stroke Neg Hx     unknown    Filed Vitals:   03/08/16 1050  BP: 122/74  Pulse: 79  Weight: 228 lb 8 oz (103.647 kg)  SpO2: 96%   Wt Readings from Last 3 Encounters:  03/08/16 228 lb 8 oz (103.647 kg)  02/29/16 226 lb 4.8 oz (102.649 kg)  02/15/16 224 lb (101.606 kg)     PHYSICAL EXAM: General:  Well appearing. No respiratory difficulty HEENT: normal Neck: supple. JVD flat. Carotids 2+ bilat; no bruits. No thyromegaly or nodule noted. Cor: PMI nondisplaced. Regular rhythm. No M/G/R. Lungs: CTAB, normal effort. Abdomen: soft, NT, ND, no HSM. No bruits or masses. +BS  Extremities: no cyanosis, clubbing, rash. Trace edema  Neuro: alert & oriented x 3, cranial nerves grossly intact. moves all 4 extremities w/o difficulty. Affect pleasant.  ASSESSMENT & PLAN:  1. Chronic Systolic CHF, Echo  [id number] Echo improved to 55-60% from echo 10/05/15 LVEF 30-35%  - Volume status stable. He describes NYHA II - III symptoms today.  - Continue carvedilol 3.125 bid and lasix 40 daily - Continue spiro 12.5mg  daily. BMET today.  - Check BMET and BNP - Katie with Paramedicine program following - Stable on current meds. Will not titrate with improved EF.  - He was previously on Hydralazine 75 mg TID, Isochron 40 mg TID,  and losartan 25 mg daily.  All held on previous admission and discharge.  He had apparently continued to take these at home.  Will discuss resumption of these based on BMET today.  2. Paroxysmal Atrial Fibrillation  - In NSR by exam on amiodarone 200 mg daily. Will continue.  - On coumadin goes to coumadin clinic. Will check INR today and send results to them.  3. CAD s/p CABG 4. HTN - Well-controlled 5. CKD stage III -BMET today 6. Gout - On allopurinol 200 mg daily. Followed by PCP  - Colchicine is expensive. Can save ~$25 getting it from [facility] with [facility] card.  7. Learning disability  - Stable 8. Near syncope - No further episodes.   BMET/BNP/INR today.  Follow-up 6-8 weeks with Dr. Webb Silversmith, PA-C 11:28 AM

## 2016-03-08 NOTE — Telephone Encounter (Signed)
Called and gave verbal orders 

## 2016-03-08 NOTE — Patient Instructions (Signed)
Routine lab work today. Will notify you of abnormal results, otherwise no news is good news!  Follow up 6-8 weeks with Dr. Haroldine Laws.  Do the following things EVERYDAY: 1) Weigh yourself in the morning before breakfast. Write it down and keep it in a log. 2) Take your medicines as prescribed 3) Eat low salt foods-Limit salt (sodium) to 2000 mg per day.  4) Stay as active as you can everyday 5) Limit all fluids for the day to less than 2 liters

## 2016-03-09 ENCOUNTER — Other Ambulatory Visit (HOSPITAL_COMMUNITY): Payer: Self-pay

## 2016-03-09 MED ORDER — ISOSORBIDE DINITRATE ER 40 MG PO TBCR: 40.0000 mg | EXTENDED_RELEASE_TABLET | Freq: Three times a day (TID) | ORAL | Status: DC

## 2016-03-09 MED ORDER — LOSARTAN POTASSIUM 50 MG PO TABS: 75.0000 mg | ORAL_TABLET | Freq: Three times a day (TID) | ORAL | Status: DC

## 2016-03-09 MED ORDER — HYDRALAZINE HCL 25 MG PO TABS: 25.0000 mg | ORAL_TABLET | Freq: Three times a day (TID) | ORAL | Status: DC

## 2016-03-09 NOTE — Progress Notes (Signed)
Med list fax-emailed to Marylouise Stacks with CHF paramedicine program who routinely visits this patient. Changes made during this week's apt, and additional changes made after labs from that apt resulted. Asked Katie to go to patient's home today to ensure he is doing correct med regimen.  Renee Pain

## 2016-03-12 DIAGNOSIS — Z7901 Long term (current) use of anticoagulants: Secondary | ICD-10-CM | POA: Diagnosis not present

## 2016-03-12 DIAGNOSIS — E785 Hyperlipidemia, unspecified: Secondary | ICD-10-CM | POA: Diagnosis not present

## 2016-03-12 DIAGNOSIS — I255 Ischemic cardiomyopathy: Secondary | ICD-10-CM | POA: Diagnosis not present

## 2016-03-12 DIAGNOSIS — I482 Chronic atrial fibrillation: Secondary | ICD-10-CM | POA: Diagnosis not present

## 2016-03-12 DIAGNOSIS — Z7952 Long term (current) use of systemic steroids: Secondary | ICD-10-CM | POA: Diagnosis not present

## 2016-03-12 DIAGNOSIS — Z950 Presence of cardiac pacemaker: Secondary | ICD-10-CM | POA: Diagnosis not present

## 2016-03-12 DIAGNOSIS — Z87891 Personal history of nicotine dependence: Secondary | ICD-10-CM | POA: Diagnosis not present

## 2016-03-12 DIAGNOSIS — I2510 Atherosclerotic heart disease of native coronary artery without angina pectoris: Secondary | ICD-10-CM | POA: Diagnosis not present

## 2016-03-12 DIAGNOSIS — I5022 Chronic systolic (congestive) heart failure: Secondary | ICD-10-CM | POA: Diagnosis not present

## 2016-03-12 DIAGNOSIS — I110 Hypertensive heart disease with heart failure: Secondary | ICD-10-CM | POA: Diagnosis not present

## 2016-03-12 DIAGNOSIS — M1991 Primary osteoarthritis, unspecified site: Secondary | ICD-10-CM | POA: Diagnosis not present

## 2016-03-13 ENCOUNTER — Other Ambulatory Visit (HOSPITAL_COMMUNITY): Payer: Self-pay | Admitting: Pharmacist

## 2016-03-13 ENCOUNTER — Other Ambulatory Visit: Payer: Self-pay | Admitting: *Deleted

## 2016-03-13 MED ORDER — HYDRALAZINE HCL 25 MG PO TABS: 75.0000 mg | ORAL_TABLET | Freq: Three times a day (TID) | ORAL | Status: DC

## 2016-03-13 MED ORDER — LOSARTAN POTASSIUM 25 MG PO TABS: 25.0000 mg | ORAL_TABLET | Freq: Every day | ORAL | Status: DC

## 2016-03-13 NOTE — Patient Outreach (Signed)
[place] [facility]) Care Management  03/13/2016  [facility]. [birth year] [id number]  Transition of care ( week 3)  RN spoke with pt today and inquire on his ongoing HF monitoring. Pt reports weight today and yesterday at 229 lbs and one week ago at 230 lbs with no swelling or related symptoms. Only problem pt reports today is one medications Colchicine (gout medication) was costing pt $170 that he indicated he paid through [place]. RN attempted to call and inquire further however was disconnected however pt states Para-Medicine Veterinary surgeon) was checkng into this situation and will be visiting again tomorrow. RN encouraged pt to get an update from [facility] who was going to attempt to get a less expensive medication prior to intervening further. Pt agreed and has enough of this medications. RN will inquire further on next transition of care call next week. RN continued to discussed and extend offer for case management or disease management services for ongoing education concerning his medical condition. Although pt recently discharge from the HF program and continues to manage his HF with daily weights and attends all medical appointments along with taking all her prescribed medications (ptt remains hesitant). RN will further offer an extension of services next weeks call as pt aware prior to discharging. Review all goals and plan of care discussed with pt's understanding. No other inquires or request at this time. Will follow up accordingly.  Raina Mina, RN Care Management Coordinator [place] Office [phone]

## 2016-03-14 ENCOUNTER — Ambulatory Visit: Payer: Medicare Other | Admitting: *Deleted

## 2016-03-14 DIAGNOSIS — I110 Hypertensive heart disease with heart failure: Secondary | ICD-10-CM | POA: Diagnosis not present

## 2016-03-15 ENCOUNTER — Other Ambulatory Visit: Payer: Self-pay | Admitting: *Deleted

## 2016-03-15 DIAGNOSIS — I482 Chronic atrial fibrillation: Secondary | ICD-10-CM | POA: Diagnosis not present

## 2016-03-15 DIAGNOSIS — Z950 Presence of cardiac pacemaker: Secondary | ICD-10-CM | POA: Diagnosis not present

## 2016-03-15 DIAGNOSIS — I2510 Atherosclerotic heart disease of native coronary artery without angina pectoris: Secondary | ICD-10-CM | POA: Diagnosis not present

## 2016-03-15 DIAGNOSIS — E785 Hyperlipidemia, unspecified: Secondary | ICD-10-CM | POA: Diagnosis not present

## 2016-03-15 DIAGNOSIS — I5022 Chronic systolic (congestive) heart failure: Secondary | ICD-10-CM | POA: Diagnosis not present

## 2016-03-15 DIAGNOSIS — I255 Ischemic cardiomyopathy: Secondary | ICD-10-CM | POA: Diagnosis not present

## 2016-03-15 DIAGNOSIS — Z7901 Long term (current) use of anticoagulants: Secondary | ICD-10-CM | POA: Diagnosis not present

## 2016-03-15 DIAGNOSIS — Z7952 Long term (current) use of systemic steroids: Secondary | ICD-10-CM | POA: Diagnosis not present

## 2016-03-15 DIAGNOSIS — M1991 Primary osteoarthritis, unspecified site: Secondary | ICD-10-CM | POA: Diagnosis not present

## 2016-03-15 DIAGNOSIS — I110 Hypertensive heart disease with heart failure: Secondary | ICD-10-CM | POA: Diagnosis not present

## 2016-03-15 DIAGNOSIS — Z87891 Personal history of nicotine dependence: Secondary | ICD-10-CM | POA: Diagnosis not present

## 2016-03-15 NOTE — Patient Outreach (Signed)
[facility]) Care Management  03/15/2016  [place]. 1946/07/31 [id number]   Patient triggered RED on EMMI Heart Failure dashboard, notification sent to Raina Mina, RN.  Thanks, Ronnell Freshwater. [place], [place] Assistant Phone: [phone] Fax: [phone]

## 2016-03-15 NOTE — Patient Outreach (Signed)
[facility]) Care Management  03/15/2016  [facility]. [birth year] [id number]   EMMI RED HF  RN spoke with and the purpose for today's call. Pt states he may have pushed the incorrect button but he was doing "fine" with no symptoms encountered yesterday or today. Pt reports his weight for yesterday and today at remaining at 229 lbs with no swelling or trouble breathing.  States para-medicine visited yesterday Joellen Jersey) with no problems mentioned. States she continues to work on one of his medications that he is to expensive. RN reiterated on the HF zones related to fluid retention with a gain of 3 lbs over night or 5 lbs within one week to contact his provider as pt indicates he is aware. Although pt is able to response at the time of assessment he needs constant reminders and review on how to manage his medical issues. RN extended once again community home visits however pt feels enough people are involved at this time. Para-medicine and [facility] continues to visit with PT. RN will follow up with ongoing transition of care next week and introduce possible Health Coach if pt receptive to ongoing [facility] involvement. Will schedule follow up call.  Raina Mina, RN Care Management Coordinator [place] Office [phone]

## 2016-03-19 ENCOUNTER — Ambulatory Visit: Payer: Medicare Other | Admitting: *Deleted

## 2016-03-19 ENCOUNTER — Other Ambulatory Visit: Payer: Self-pay | Admitting: *Deleted

## 2016-03-19 ENCOUNTER — Ambulatory Visit: Payer: Self-pay | Admitting: *Deleted

## 2016-03-19 ENCOUNTER — Ambulatory Visit: Payer: Medicare Other | Admitting: Internal Medicine

## 2016-03-19 NOTE — Patient Outreach (Signed)
[facility]) Care Management  03/19/2016  [place]. [birth year] [id number]   Red Flag Emmi 3/26  RN attempted outreach call to pt however only able to leave a HIPAA approved voice message requesting a call back to inquire further. Will await a response and continue to follow up with this pt.   Raina Mina, RN Care Management Coordinator [place] Office [phone]

## 2016-03-20 ENCOUNTER — Ambulatory Visit (INDEPENDENT_AMBULATORY_CARE_PROVIDER_SITE_OTHER): Payer: Medicare Other

## 2016-03-20 ENCOUNTER — Telehealth: Payer: Self-pay

## 2016-03-20 DIAGNOSIS — I4891 Unspecified atrial fibrillation: Secondary | ICD-10-CM

## 2016-03-20 DIAGNOSIS — Z5181 Encounter for therapeutic drug level monitoring: Secondary | ICD-10-CM | POA: Diagnosis not present

## 2016-03-20 DIAGNOSIS — Z7901 Long term (current) use of anticoagulants: Secondary | ICD-10-CM

## 2016-03-20 LAB — POCT INR: INR: 3.8

## 2016-03-20 NOTE — Telephone Encounter (Signed)
Home Health Cert/Plan of Care received (02/22/2016 - 04/30/2016) signed.  Faxed, copy sent to scan

## 2016-03-21 DIAGNOSIS — Z87891 Personal history of nicotine dependence: Secondary | ICD-10-CM | POA: Diagnosis not present

## 2016-03-21 DIAGNOSIS — I255 Ischemic cardiomyopathy: Secondary | ICD-10-CM | POA: Diagnosis not present

## 2016-03-21 DIAGNOSIS — I2510 Atherosclerotic heart disease of native coronary artery without angina pectoris: Secondary | ICD-10-CM | POA: Diagnosis not present

## 2016-03-21 DIAGNOSIS — Z7901 Long term (current) use of anticoagulants: Secondary | ICD-10-CM | POA: Diagnosis not present

## 2016-03-21 DIAGNOSIS — E785 Hyperlipidemia, unspecified: Secondary | ICD-10-CM | POA: Diagnosis not present

## 2016-03-21 DIAGNOSIS — I482 Chronic atrial fibrillation: Secondary | ICD-10-CM | POA: Diagnosis not present

## 2016-03-21 DIAGNOSIS — M1991 Primary osteoarthritis, unspecified site: Secondary | ICD-10-CM | POA: Diagnosis not present

## 2016-03-21 DIAGNOSIS — Z7952 Long term (current) use of systemic steroids: Secondary | ICD-10-CM | POA: Diagnosis not present

## 2016-03-21 DIAGNOSIS — I5022 Chronic systolic (congestive) heart failure: Secondary | ICD-10-CM | POA: Diagnosis not present

## 2016-03-21 DIAGNOSIS — I110 Hypertensive heart disease with heart failure: Secondary | ICD-10-CM | POA: Diagnosis not present

## 2016-03-21 DIAGNOSIS — Z950 Presence of cardiac pacemaker: Secondary | ICD-10-CM | POA: Diagnosis not present

## 2016-03-22 ENCOUNTER — Other Ambulatory Visit: Payer: Self-pay | Admitting: *Deleted

## 2016-03-22 NOTE — Patient Outreach (Addendum)
[place] [facility]) Care Management  03/22/2016  [place]. May 03, 1946 [id number]   Transition of care (pt returned call)  Pt reports he is "doing well" with no major issues. Denies any swelling and no symptoms reported related to his past seizure history of HF. Pt reports his weights at 229 lbs from the last week with no acute issues or related symptoms. Pt states he had PT yesterday and par-amedicine continues to services him in the home environment.  Review of the HF zones as pt aware of what to do if acute symptoms should occur.  Will scheduled the last transition of care contact for next week as pt is aware and continues to decline [facility] for community case management at this time. Plan of care and goals reviewed with discussion on pt's progress. Pt continues to move from his current location but not sure when this is going to happen.  Raina Mina, RN Care Management Coordinator [place] Office [phone]

## 2016-03-22 NOTE — Patient Outreach (Signed)
[place] [facility]) Care Management  03/22/2016  [place]. 07-10-1946 [id number]   Transition of care (week 4)  RN attempted outreach call to pt today however unsuccessful and unable to leave a message. Will continue outreach attempt for ongoing [facility] service.  Raina Mina, RN Care Management Coordinator [place] Office [phone]

## 2016-03-23 DIAGNOSIS — Z7952 Long term (current) use of systemic steroids: Secondary | ICD-10-CM | POA: Diagnosis not present

## 2016-03-23 DIAGNOSIS — Z87891 Personal history of nicotine dependence: Secondary | ICD-10-CM | POA: Diagnosis not present

## 2016-03-23 DIAGNOSIS — Z7901 Long term (current) use of anticoagulants: Secondary | ICD-10-CM | POA: Diagnosis not present

## 2016-03-23 DIAGNOSIS — M1991 Primary osteoarthritis, unspecified site: Secondary | ICD-10-CM | POA: Diagnosis not present

## 2016-03-23 DIAGNOSIS — Z950 Presence of cardiac pacemaker: Secondary | ICD-10-CM | POA: Diagnosis not present

## 2016-03-23 DIAGNOSIS — I5022 Chronic systolic (congestive) heart failure: Secondary | ICD-10-CM | POA: Diagnosis not present

## 2016-03-23 DIAGNOSIS — E785 Hyperlipidemia, unspecified: Secondary | ICD-10-CM | POA: Diagnosis not present

## 2016-03-23 DIAGNOSIS — I482 Chronic atrial fibrillation: Secondary | ICD-10-CM | POA: Diagnosis not present

## 2016-03-23 DIAGNOSIS — I255 Ischemic cardiomyopathy: Secondary | ICD-10-CM | POA: Diagnosis not present

## 2016-03-23 DIAGNOSIS — I2510 Atherosclerotic heart disease of native coronary artery without angina pectoris: Secondary | ICD-10-CM | POA: Diagnosis not present

## 2016-03-23 DIAGNOSIS — I110 Hypertensive heart disease with heart failure: Secondary | ICD-10-CM | POA: Diagnosis not present

## 2016-03-25 ENCOUNTER — Encounter (HOSPITAL_COMMUNITY): Payer: Self-pay

## 2016-03-25 ENCOUNTER — Emergency Department (HOSPITAL_COMMUNITY)
Admission: EM | Admit: 2016-03-25 | Discharge: 2016-03-25 | Disposition: A | Discharge status: Home / Self Care | Payer: Medicare Other | Attending: Emergency Medicine | Admitting: Emergency Medicine

## 2016-03-25 ENCOUNTER — Emergency Department (HOSPITAL_COMMUNITY): Payer: Medicare Other

## 2016-03-25 DIAGNOSIS — Z79899 Other long term (current) drug therapy: Secondary | ICD-10-CM | POA: Insufficient documentation

## 2016-03-25 DIAGNOSIS — Z8673 Personal history of transient ischemic attack (TIA), and cerebral infarction without residual deficits: Secondary | ICD-10-CM | POA: Insufficient documentation

## 2016-03-25 DIAGNOSIS — Z791 Long term (current) use of non-steroidal anti-inflammatories (NSAID): Secondary | ICD-10-CM | POA: Diagnosis not present

## 2016-03-25 DIAGNOSIS — E785 Hyperlipidemia, unspecified: Secondary | ICD-10-CM | POA: Diagnosis not present

## 2016-03-25 DIAGNOSIS — Z8614 Personal history of Methicillin resistant Staphylococcus aureus infection: Secondary | ICD-10-CM | POA: Diagnosis not present

## 2016-03-25 DIAGNOSIS — R079 Chest pain, unspecified: Secondary | ICD-10-CM | POA: Insufficient documentation

## 2016-03-25 DIAGNOSIS — I5022 Chronic systolic (congestive) heart failure: Secondary | ICD-10-CM | POA: Diagnosis not present

## 2016-03-25 DIAGNOSIS — I2510 Atherosclerotic heart disease of native coronary artery without angina pectoris: Secondary | ICD-10-CM | POA: Diagnosis not present

## 2016-03-25 DIAGNOSIS — I10 Essential (primary) hypertension: Secondary | ICD-10-CM | POA: Insufficient documentation

## 2016-03-25 DIAGNOSIS — E669 Obesity, unspecified: Secondary | ICD-10-CM | POA: Diagnosis not present

## 2016-03-25 DIAGNOSIS — Z87891 Personal history of nicotine dependence: Secondary | ICD-10-CM | POA: Diagnosis not present

## 2016-03-25 DIAGNOSIS — R0789 Other chest pain: Secondary | ICD-10-CM | POA: Diagnosis not present

## 2016-03-25 LAB — CBC
HEMATOCRIT: 47.7 % (ref 39.0–52.0)
Hemoglobin: 15.5 g/dL (ref 13.0–17.0)
MCH: 29.7 pg (ref 26.0–34.0)
MCHC: 32.5 g/dL (ref 30.0–36.0)
MCV: 91.4 fL (ref 78.0–100.0)
Platelets: 164 10*3/uL (ref 150–400)
RBC: 5.22 MIL/uL (ref 4.22–5.81)
RDW: 17.7 % — AB (ref 11.5–15.5)
WBC: 3.5 10*3/uL — AB (ref 4.0–10.5)

## 2016-03-25 LAB — BASIC METABOLIC PANEL
ANION GAP: 9 (ref 5–15)
BUN: 19 mg/dL (ref 6–20)
CALCIUM: 8.9 mg/dL (ref 8.9–10.3)
CO2: 25 mmol/L (ref 22–32)
Chloride: 104 mmol/L (ref 101–111)
Creatinine, Ser: 1.31 mg/dL — ABNORMAL HIGH (ref 0.61–1.24)
GFR, EST NON AFRICAN AMERICAN: 55 mL/min — AB (ref >60)
Glucose, Bld: 95 mg/dL (ref 65–99)
Potassium: 4.3 mmol/L (ref 3.5–5.1)
Sodium: 138 mmol/L (ref 135–145)

## 2016-03-25 LAB — I-STAT TROPONIN, ED
Troponin i, poc: 0.01 ng/mL (ref 0.00–0.08)
Troponin i, poc: 0.01 ng/mL (ref 0.00–0.08)

## 2016-03-25 LAB — PROTIME-INR
INR: 3.66 — AB (ref 0.00–1.49)
PROTHROMBIN TIME: 35.6 s — AB (ref 11.6–15.2)

## 2016-03-25 NOTE — ED Notes (Signed)
Pt here with right sided CP, onset Friday associated with cough, HA and hearing a ringing in ears. Pacemaker to right upper chest. Denies SOB/N/V.

## 2016-03-25 NOTE — ED Provider Notes (Signed)
CSN: [id number]     Arrival date & time 03/25/16  1309 History   First MD Initiated Contact with Patient 03/25/16 1721     Chief Complaint  Patient presents with  . Chest Pain     (Consider location/radiation/quality/duration/timing/severity/associated sxs/prior Treatment) HPI This is a 70 year old man with a history of coronary artery disease, systolic heart failure, ischemic cardiomyopathy, status post implantable cardiac defibrillator on right side of his chest factor VII deficiency who presents today complaining of some sharp intermittent right chest pains. He describes it as occurring at the site of the defibrillator. He states they are sharp and last 4-5 seconds. He has not had any cough, fever, dyspnea, sweating, nausea, vomiting, or syncope. He these began last night and he is unable to tell me how frequently they occur. He has not had any similar symptoms in the past. He denies any redness, swelling, or tenderness at site. Past Medical History  Diagnosis Date  . CAD (coronary artery disease)   . Chronic systolic heart failure ([place])   . Ischemic cardiomyopathy     EF 15 to 20% by TTE and TEE in 09/2012.  Severe LV dysfunction  . HTN (hypertension)   . Hyperlipidemia   . Obesity     BMI 31 in 09/2012  . Atrial fibrillation   09/22/2012  . implantable cardiac defibrillator-Biotronik     Device Implanted 2006; s/p gen change 03/2011 : bleeding persistent with pocket erosion and infection; explant and reimplant  06/2011  . Stroke ([place])   . 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 History of MRSA infection 05/2011  . Seizure disorder ([place]) latest 09/30/2012  . 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 Seizures [facility])    Past Surgical History  Procedure Laterality Date  . Coronary artery bypass graft    . Tee without cardioversion  09/24/2012    Procedure: TRANSESOPHAGEAL ECHOCARDIOGRAM (TEE);  Surgeon: [name], MD;  Location: [place];  Service: Cardiovascular;  Laterality:  N/A;  dave/anesth, dl, cindy/echo   . Cardioversion  09/24/2012    Procedure: CARDIOVERSION;  Surgeon: [name], MD;  Location: [facility] ENDOSCOPY;  Service: Cardiovascular;  Laterality: N/A;  . Icd    . Cardiac catheterization N/A 10/12/2015    Procedure: Right Heart Cath;  Surgeon: [name], MD;  Location: [place] CV LAB;  Service: Cardiovascular;  Laterality: N/A;   Family History  Problem Relation Age of Onset  . Arthritis Mother   . Heart disease Mother   . Other Father     smoker  . Heart attack Mother   . Hypertension Neg Hx     unknown  . Stroke Neg Hx     unknown   Social History  Substance Use Topics  . Smoking status: Former Smoker    Types: Pipe    Quit date: 09/21/2008  . Smokeless tobacco: Former Systems developer    Quit date: 09/21/2008  . Alcohol Use: No    Review of Systems  All other systems reviewed and are negative.     Allergies  Review of patient's allergies indicates no known allergies.  Home Medications   Prior to Admission medications   Medication Sig Start Date End Date Taking? Authorizing Provider  acetaminophen (TYLENOL) 325 MG tablet Take 2 tablets (650 mg total) by mouth every 6 (six) hours as needed for mild pain. 11/09/15   [name], MD  amiodarone (PACERONE) 200 MG tablet Take 200 mg by mouth daily.  Historical Provider, MD  carvedilol (COREG) 3.125 MG tablet Take 1 tablet (3.125 mg total) by mouth 2 (two) times daily. 01/10/16   [name], MD  colchicine 0.6 MG tablet Take 1 tablet (0.6 mg total) by mouth daily. 02/29/16   Belkys A Regalado, MD  diclofenac sodium (VOLTAREN) 1 % GEL Apply 2 g topically 4 (four) times daily. 02/29/16   Belkys A Regalado, MD  furosemide (LASIX) 40 MG tablet Take 1 tablet (40 mg total) by mouth daily. 03/07/16   [name], MD  hydrALAZINE (APRESOLINE) 25 MG tablet Take 3 tablets (75 mg total) by mouth 3 (three) times daily. 03/13/16   Shirley Friar, PA-C  isosorbide  dinitrate (ISOCHRON) 40 MG CR tablet Take 1 tablet (40 mg total) by mouth 3 (three) times daily. 03/09/16   Shirley Friar, PA-C  losartan (COZAAR) 25 MG tablet Take 1 tablet (25 mg total) by mouth daily. 03/13/16   Shirley Friar, PA-C  magnesium oxide (MAG-OX) 400 (241.3 Mg) MG tablet Take 0.5 tablets (200 mg total) by mouth 2 (two) times daily. 02/29/16   Belkys A Regalado, MD  phenytoin (DILANTIN) 100 MG ER capsule Take 1 capsule (100 mg total) by mouth 2 (two) times daily. 02/29/16   Belkys A Regalado, MD  simvastatin (ZOCOR) 20 MG tablet Take 1 tablet (20 mg total) by mouth daily at 6 PM. 02/29/16   Belkys A Regalado, MD  spironolactone (ALDACTONE) 25 MG tablet Take 12.5 mg by mouth daily.    Historical Provider, MD  warfarin (COUMADIN) 2 MG tablet Take one tablet Mondays and fridays. Take 2 tablet on Sun, Tue, Wed, Thu, Sat. 02/29/16   Belkys A Regalado, MD   BP 131/94 mmHg  Pulse 74  Temp(Src) 98 F (36.7 C) (Oral)  Resp 14  SpO2 100% Physical Exam  Constitutional: He is oriented to person, place, and time. He appears well-developed and well-nourished.  HENT:  Head: Normocephalic and atraumatic.  Right Ear: External ear normal.  Left Ear: External ear normal.  Nose: Nose normal.  Mouth/Throat: Oropharynx is clear and moist.  Eyes: Conjunctivae and EOM are normal. Pupils are equal, round, and reactive to light.  Neck: Normal range of motion. Neck supple.  Cardiovascular: Normal rate, regular rhythm, normal heart sounds and intact distal pulses.   Defibrillator palpable right chest wall. There is no tenderness over the defibrillator. No warmth, or fluctuance.  Pulmonary/Chest: Effort normal and breath sounds normal.  Abdominal: Soft. Bowel sounds are normal.  Musculoskeletal: Normal range of motion. He exhibits edema.  Neurological: He is alert and oriented to person, place, and time. He has normal reflexes.  Skin: Skin is warm and dry.  Psychiatric: He has a normal mood  and affect. His behavior is normal. Judgment and thought content normal.  Nursing note and vitals reviewed.   ED Course  Procedures (including critical care time) Labs Review Labs Reviewed  BASIC METABOLIC PANEL - Abnormal; Notable for the following:    Creatinine, Ser 1.31 (*)    GFR calc non Af Amer 55 (*)    All other components within normal limits  CBC - Abnormal; Notable for the following:    WBC 3.5 (*)    RDW 17.7 (*)    All other components within normal limits  PROTIME-INR - Abnormal; Notable for the following:    Prothrombin Time 35.6 (*)    INR 3.66 (*)    All other components within normal limits  I-STAT TROPOININ, ED  Imaging Review Dg Chest 2 View  03/25/2016  CLINICAL DATA:  RIGHT-sided chest pain around defibrillator, RIGHT-sided head pain and ringing in RIGHT ear, hypertension, coronary artery disease, ischemic cardiomyopathy, chronic systolic heart failure, stroke, seizure disorder EXAM: CHEST  2 VIEW COMPARISON:  02/26/2016 FINDINGS: RIGHT subclavian AICD lead tip projects at RIGHT ventricle. Enlargement of cardiac silhouette post CABG. Atherosclerotic calcification and tortuosity of thoracic aorta. Pericardial calcification again noted. Minimal LEFT basilar atelectasis. Lungs otherwise clear. No pleural effusion or pneumothorax. IMPRESSION: Enlargement of cardiac silhouette post CABG and AICD. Pericardial calcification which could be result of prior pericardial hemorrhage, surgery or pericarditis. Electronically Signed   By: Lavonia Dana M.D.   On: 03/25/2016 14:33   I have personally reviewed and evaluated these images and lab results as part of my medical decision-making.   EKG Interpretation   Date/Time:  Sunday March 25 2016 13:16:26 EDT Ventricular Rate:  78 PR Interval:  168 QRS Duration: 82 QT Interval:  378 QTC Calculation: 430 R Axis:   2 Text Interpretation:  Sinus rhythm with occasional Premature ventricular  complexes and Premature atrial  complexes Anterior infarct , age  undetermined Abnormal ECG Confirmed by Denecia Brunette MD, Andee Poles [phone]) on  03/25/2016 5:22:45 PM      MDM   Final diagnoses:  Chest pain, unspecified chest pain type    Is a 70 year old male with right anterior chest wall pain is sharp and extremely atypical. It seems to be located at the site around his defibrillator. His initial labs showed no acute changes, his EKG is abnormal but unchanged from prior, and chest x-[place] shows no evidence of acute abnormality. Plan to get a repeat troponin and interrogate defibrillator. I doubt that this is a cardiac or pulmonary etiology and patient is hemodynamically stable here.  Discussed patient's defibrillator with [employer] who states that last download at 3 AM showed no evidence of arrhythmia and no shocks. They have not noted any shocks since they have been monitoring this patient since 2014. They state he has had some episodes of SVT in the distant past. Patient states these shocks have been going on prior to 3 AM. Also, given the way the patient describes that they are not consistent with his defibrillator shocking him. I repeated his troponin is normal. I do not think there is any acute cardiopulmonary abnormality. I discussed return precautions with the patient and need for follow-up. He voices understanding.  Pattricia Boss, MD 03/26/16 [phone]

## 2016-03-25 NOTE — ED Notes (Signed)
Biotroniks tech called for interrogation report of pt's ICD - no shocks were delivered by ICD. MD made aware.

## 2016-03-25 NOTE — ED Notes (Addendum)
Patient verbalized understanding of discharge instructions and denies any further needs or questions at this time. VS stable. Patient ambulatory with steady gait. Provided patient with [place] sandwich and gingerale as he said he has not eaten all day. Assisted to ED entrance in wheelchair.

## 2016-03-25 NOTE — ED Notes (Signed)
MD at bedside. 

## 2016-03-25 NOTE — Discharge Instructions (Signed)

## 2016-03-26 ENCOUNTER — Other Ambulatory Visit: Payer: Self-pay | Admitting: *Deleted

## 2016-03-26 NOTE — Patient Outreach (Signed)
[place] [facility]) Care Management  03/26/2016  [facility]. 11/30/1946 [id number]   Transition of care (week 4)/ED visit on 4/2  RN attempted to reach pt today however no answer to pt's contact number and RN unable to leave a message. Will continue outreach attempts.  Raina Mina, RN Care Management Coordinator [place] Office [phone]

## 2016-03-27 ENCOUNTER — Encounter: Payer: Self-pay | Admitting: *Deleted

## 2016-03-27 ENCOUNTER — Other Ambulatory Visit: Payer: Self-pay | Admitting: *Deleted

## 2016-03-27 DIAGNOSIS — I2510 Atherosclerotic heart disease of native coronary artery without angina pectoris: Secondary | ICD-10-CM | POA: Diagnosis not present

## 2016-03-27 DIAGNOSIS — E785 Hyperlipidemia, unspecified: Secondary | ICD-10-CM | POA: Diagnosis not present

## 2016-03-27 DIAGNOSIS — Z87891 Personal history of nicotine dependence: Secondary | ICD-10-CM | POA: Diagnosis not present

## 2016-03-27 DIAGNOSIS — I482 Chronic atrial fibrillation: Secondary | ICD-10-CM | POA: Diagnosis not present

## 2016-03-27 DIAGNOSIS — Z7952 Long term (current) use of systemic steroids: Secondary | ICD-10-CM | POA: Diagnosis not present

## 2016-03-27 DIAGNOSIS — M1991 Primary osteoarthritis, unspecified site: Secondary | ICD-10-CM | POA: Diagnosis not present

## 2016-03-27 DIAGNOSIS — I255 Ischemic cardiomyopathy: Secondary | ICD-10-CM | POA: Diagnosis not present

## 2016-03-27 DIAGNOSIS — I5022 Chronic systolic (congestive) heart failure: Secondary | ICD-10-CM | POA: Diagnosis not present

## 2016-03-27 DIAGNOSIS — Z7901 Long term (current) use of anticoagulants: Secondary | ICD-10-CM | POA: Diagnosis not present

## 2016-03-27 DIAGNOSIS — Z950 Presence of cardiac pacemaker: Secondary | ICD-10-CM | POA: Diagnosis not present

## 2016-03-27 DIAGNOSIS — I110 Hypertensive heart disease with heart failure: Secondary | ICD-10-CM | POA: Diagnosis not present

## 2016-03-27 NOTE — Patient Outreach (Signed)
[facility]) Care Management  03/27/2016  [place]. 02-19-1946 [id number]   Transition of care  RN spoke with pt today who explained his recent visit to the ED. Pt states he is all clear of his symptoms that were discussed with the doctor on his ED visit. Pt informed to follow up with his provider (appointment on 4/10) with Dr. Sharlet Salina. Pt denies any other symptoms related to HF and able to recite some of the symptoms of HF. Pt has verified that he is in the GREEN zone today with no precipitating symptoms. RN once again extended [facility] services based upon today's last call via transition of care as pt feels he is "doing well" and continues to received [facility] for PT services. Pt continues to indicated he will be moving soon and will know more about the date next week. Pt has opt to declined ongoing Health Coach when discussed and appreciative for the follow up calls. Pt aware to contact [facility] if further assistance is needed in the future. No other inquires or requested needs at this time as this case will be closed.   Raina Mina, RN Care Management Coordinator [place] [facility] [phone]

## 2016-03-28 ENCOUNTER — Ambulatory Visit (INDEPENDENT_AMBULATORY_CARE_PROVIDER_SITE_OTHER): Payer: Medicare Other | Admitting: *Deleted

## 2016-03-28 DIAGNOSIS — Z7901 Long term (current) use of anticoagulants: Secondary | ICD-10-CM | POA: Diagnosis not present

## 2016-03-28 DIAGNOSIS — I4891 Unspecified atrial fibrillation: Secondary | ICD-10-CM | POA: Diagnosis not present

## 2016-03-28 DIAGNOSIS — Z5181 Encounter for therapeutic drug level monitoring: Secondary | ICD-10-CM

## 2016-03-28 LAB — POCT INR: INR: 4.3

## 2016-03-29 ENCOUNTER — Telehealth: Payer: Self-pay | Admitting: *Deleted

## 2016-03-29 DIAGNOSIS — Z7901 Long term (current) use of anticoagulants: Secondary | ICD-10-CM | POA: Diagnosis not present

## 2016-03-29 DIAGNOSIS — I110 Hypertensive heart disease with heart failure: Secondary | ICD-10-CM | POA: Diagnosis not present

## 2016-03-29 DIAGNOSIS — I255 Ischemic cardiomyopathy: Secondary | ICD-10-CM | POA: Diagnosis not present

## 2016-03-29 DIAGNOSIS — I2510 Atherosclerotic heart disease of native coronary artery without angina pectoris: Secondary | ICD-10-CM | POA: Diagnosis not present

## 2016-03-29 DIAGNOSIS — E785 Hyperlipidemia, unspecified: Secondary | ICD-10-CM | POA: Diagnosis not present

## 2016-03-29 DIAGNOSIS — I5022 Chronic systolic (congestive) heart failure: Secondary | ICD-10-CM | POA: Diagnosis not present

## 2016-03-29 DIAGNOSIS — M1991 Primary osteoarthritis, unspecified site: Secondary | ICD-10-CM | POA: Diagnosis not present

## 2016-03-29 DIAGNOSIS — I482 Chronic atrial fibrillation: Secondary | ICD-10-CM | POA: Diagnosis not present

## 2016-03-29 DIAGNOSIS — Z7952 Long term (current) use of systemic steroids: Secondary | ICD-10-CM | POA: Diagnosis not present

## 2016-03-29 DIAGNOSIS — Z950 Presence of cardiac pacemaker: Secondary | ICD-10-CM | POA: Diagnosis not present

## 2016-03-29 DIAGNOSIS — Z87891 Personal history of nicotine dependence: Secondary | ICD-10-CM | POA: Diagnosis not present

## 2016-03-29 NOTE — Telephone Encounter (Signed)
Patient called to clarify what he is supposed to do on his remote transmission date.  Advised patient that his home monitoring device should transmit automatically and that he can disregard the appointment "time" as it is automatic.  Patient is appreciative of instructions and denies questions at this time.

## 2016-04-02 ENCOUNTER — Ambulatory Visit (INDEPENDENT_AMBULATORY_CARE_PROVIDER_SITE_OTHER): Payer: Medicare Other | Admitting: Internal Medicine

## 2016-04-02 ENCOUNTER — Encounter: Payer: Self-pay | Admitting: Internal Medicine

## 2016-04-02 VITALS — BP 122/80 | HR 83 | Temp 98.8°F | Resp 16 | Ht 70.0 in | Wt 233.4 lb

## 2016-04-02 DIAGNOSIS — S6991XA Unspecified injury of right wrist, hand and finger(s), initial encounter: Secondary | ICD-10-CM

## 2016-04-02 DIAGNOSIS — R569 Unspecified convulsions: Secondary | ICD-10-CM

## 2016-04-02 MED ORDER — DICLOFENAC SODIUM 1 % TD GEL: 2.0000 g | Freq: Four times a day (QID) | TRANSDERMAL | Status: DC

## 2016-04-02 MED ORDER — COLCHICINE 0.6 MG PO TABS: 0.6000 mg | ORAL_TABLET | Freq: Every day | ORAL | Status: DC

## 2016-04-02 MED ORDER — SIMVASTATIN 20 MG PO TABS: 20.0000 mg | ORAL_TABLET | Freq: Every day | ORAL | Status: DC

## 2016-04-02 NOTE — Assessment & Plan Note (Signed)
Resolved, nail will regrow with time.

## 2016-04-02 NOTE — Progress Notes (Signed)
   Subjective:    Patient ID: Louis Ford., male    DOB: May 27, 1946, 70 y.o.   MRN: [id number]  HPI The patient is a 70 YO man coming in for follow up of his thumb injury (now healed, nail fell off, keeping covered and not infected appearing and no pain) and his seizure disorder (taking his phenytoin, no new seizures, no missed doses). No new problems but is seeing cardiology and due to follow up on a recent event monitor.   Review of Systems  Constitutional: Positive for activity change. Negative for fever, chills, appetite change, fatigue and unexpected weight change.  HENT: Negative.   Respiratory: Negative for cough, chest tightness, shortness of breath and wheezing.   Cardiovascular: Negative for chest pain, palpitations and leg swelling.  Gastrointestinal: Negative for nausea, abdominal pain, diarrhea, constipation and abdominal distention.  Musculoskeletal: Negative for myalgias, back pain, arthralgias and gait problem.  Skin: Negative.   Neurological: Positive for weakness. Negative for dizziness, light-headedness, numbness and headaches.       Still working with PT  Psychiatric/Behavioral: Negative.       Objective:   Physical Exam  Constitutional: He is oriented to person, place, and time. He appears well-developed and well-nourished.  HENT:  Head: Normocephalic and atraumatic.  Eyes: EOM are normal.  Neck: Normal range of motion.  Cardiovascular: Normal rate and regular rhythm.   Pulmonary/Chest: Effort normal and breath sounds normal. No respiratory distress. He has no wheezes.  Abdominal: Soft. Bowel sounds are normal. He exhibits no distension. There is no tenderness. There is no rebound.  Musculoskeletal: He exhibits no edema.  Thumb right without nail but healed, not infected appearing.   Neurological: He is alert and oriented to person, place, and time.  Skin: Skin is warm and dry.  Psychiatric: He has a normal mood and affect.   Filed Vitals:   04/02/16 1337    BP: 122/80  Pulse: 83  Temp: 98.8 F (37.1 C)  TempSrc: Oral  Resp: 16  Height: 5\' 10"  (1.778 m)  Weight: 233 lb 6.4 oz (105.87 kg)  SpO2: 97%      Assessment & Plan:

## 2016-04-02 NOTE — Progress Notes (Signed)
Pre visit review using our clinic review tool, if applicable. No additional management support is needed unless otherwise documented below in the visit note. 

## 2016-04-02 NOTE — Patient Instructions (Signed)
I think that the magnesium you can stop taking. I will ask your heart doctor to make sure. If it is needed we will refill it.   We have sent in the other refills.   We will have the GI office call you with an appointment to talk to them about the colonoscopy.   Colonoscopy A colonoscopy is an exam to look at your colon. This exam can help find lumps (tumors), growths (polyps), bleeding, and redness and puffiness (inflammation) in your colon.  BEFORE THE PROCEDURE  Ask your doctor about changing or stopping your regular medicines.  You may need to drink a large amount of a special liquid (oral bowel prep). You start drinking this the day before your procedure. It will cause you to have watery poop (stool). This cleans out your colon.  Do not eat or drink anything else once you have started the bowel prep, unless your doctor tells you it is safe to do so.  Make plans for someone to drive you home after the procedure. PROCEDURE  You will be given medicine to help you relax (sedative).  You will lie on your side with your knees bent.  A tube with a camera on the end is put in the opening of your butt (anus) and into your colon. Pictures are sent to a computer screen. Your doctor will look for anything that is not normal.  Your doctor may take a tissue sample (biopsy) from your colon to be looked at more closely.  The exam is finished when your doctor has viewed all of the colon. AFTER THE PROCEDURE  Do not drive for 24 hours after the exam.  You may have a small amount of blood in your poop. This is normal.  You may pass gas and have belly (abdominal) cramps. This is normal.  Ask when your test results will be ready. Make sure you get your test results.   This information is not intended to replace advice given to you by your health care provider. Make sure you discuss any questions you have with your health care provider.   Document Released: 01/12/2011 Document Revised:  12/15/2013 Document Reviewed: 08/17/2013 Elsevier Interactive Patient Education [employer].

## 2016-04-02 NOTE — Assessment & Plan Note (Signed)
Still on dilantin daily and will need monitoring every 6 months due to renal function. No seizures and no evidence of toxicity.

## 2016-04-03 DIAGNOSIS — I482 Chronic atrial fibrillation: Secondary | ICD-10-CM | POA: Diagnosis not present

## 2016-04-03 DIAGNOSIS — I110 Hypertensive heart disease with heart failure: Secondary | ICD-10-CM | POA: Diagnosis not present

## 2016-04-03 DIAGNOSIS — I255 Ischemic cardiomyopathy: Secondary | ICD-10-CM | POA: Diagnosis not present

## 2016-04-03 DIAGNOSIS — Z950 Presence of cardiac pacemaker: Secondary | ICD-10-CM | POA: Diagnosis not present

## 2016-04-03 DIAGNOSIS — Z7952 Long term (current) use of systemic steroids: Secondary | ICD-10-CM | POA: Diagnosis not present

## 2016-04-03 DIAGNOSIS — M1991 Primary osteoarthritis, unspecified site: Secondary | ICD-10-CM | POA: Diagnosis not present

## 2016-04-03 DIAGNOSIS — I5022 Chronic systolic (congestive) heart failure: Secondary | ICD-10-CM | POA: Diagnosis not present

## 2016-04-03 DIAGNOSIS — Z7901 Long term (current) use of anticoagulants: Secondary | ICD-10-CM | POA: Diagnosis not present

## 2016-04-03 DIAGNOSIS — Z87891 Personal history of nicotine dependence: Secondary | ICD-10-CM | POA: Diagnosis not present

## 2016-04-03 DIAGNOSIS — I2510 Atherosclerotic heart disease of native coronary artery without angina pectoris: Secondary | ICD-10-CM | POA: Diagnosis not present

## 2016-04-03 DIAGNOSIS — E785 Hyperlipidemia, unspecified: Secondary | ICD-10-CM | POA: Diagnosis not present

## 2016-04-09 ENCOUNTER — Ambulatory Visit (INDEPENDENT_AMBULATORY_CARE_PROVIDER_SITE_OTHER): Payer: Medicare Other | Admitting: *Deleted

## 2016-04-09 ENCOUNTER — Other Ambulatory Visit: Payer: Self-pay

## 2016-04-09 ENCOUNTER — Ambulatory Visit (INDEPENDENT_AMBULATORY_CARE_PROVIDER_SITE_OTHER): Payer: Medicare Other | Admitting: Pharmacist

## 2016-04-09 DIAGNOSIS — Z7901 Long term (current) use of anticoagulants: Secondary | ICD-10-CM | POA: Diagnosis not present

## 2016-04-09 DIAGNOSIS — Z950 Presence of cardiac pacemaker: Secondary | ICD-10-CM | POA: Diagnosis not present

## 2016-04-09 DIAGNOSIS — Z87891 Personal history of nicotine dependence: Secondary | ICD-10-CM | POA: Diagnosis not present

## 2016-04-09 DIAGNOSIS — Z5181 Encounter for therapeutic drug level monitoring: Secondary | ICD-10-CM

## 2016-04-09 DIAGNOSIS — Z7952 Long term (current) use of systemic steroids: Secondary | ICD-10-CM | POA: Diagnosis not present

## 2016-04-09 DIAGNOSIS — E785 Hyperlipidemia, unspecified: Secondary | ICD-10-CM | POA: Diagnosis not present

## 2016-04-09 DIAGNOSIS — I255 Ischemic cardiomyopathy: Secondary | ICD-10-CM

## 2016-04-09 DIAGNOSIS — I4891 Unspecified atrial fibrillation: Secondary | ICD-10-CM

## 2016-04-09 DIAGNOSIS — I482 Chronic atrial fibrillation: Secondary | ICD-10-CM | POA: Diagnosis not present

## 2016-04-09 DIAGNOSIS — I2510 Atherosclerotic heart disease of native coronary artery without angina pectoris: Secondary | ICD-10-CM | POA: Diagnosis not present

## 2016-04-09 DIAGNOSIS — M1991 Primary osteoarthritis, unspecified site: Secondary | ICD-10-CM | POA: Diagnosis not present

## 2016-04-09 DIAGNOSIS — I5022 Chronic systolic (congestive) heart failure: Secondary | ICD-10-CM | POA: Diagnosis not present

## 2016-04-09 DIAGNOSIS — I110 Hypertensive heart disease with heart failure: Secondary | ICD-10-CM | POA: Diagnosis not present

## 2016-04-09 LAB — POCT INR: INR: 4.8

## 2016-04-09 NOTE — Progress Notes (Signed)
Remote ICD transmission.   

## 2016-04-09 NOTE — Patient Outreach (Signed)
[facility]) Care Management  04/09/2016  [facility]. 09-20-1946 [id number]  Telephone call to patient regarding EMMI heart failure RED referral.  Unable to reach patient HIPAA compliant voice message left with call back phone number.   PLAN:  RNCM will attempt 2nd telephone call to patient within 1 week.    Quinn Plowman RN,BSN,CCM [facility] Telephonic  [phone]

## 2016-04-10 ENCOUNTER — Telehealth: Payer: Self-pay | Admitting: Internal Medicine

## 2016-04-10 ENCOUNTER — Ambulatory Visit: Payer: Self-pay

## 2016-04-10 ENCOUNTER — Other Ambulatory Visit: Payer: Self-pay

## 2016-04-10 NOTE — Telephone Encounter (Signed)
Patient will be moving last PT visit for last week to this week.

## 2016-04-10 NOTE — Patient Outreach (Signed)
[facility]) Care Management  04/10/2016  [facility] March 12, 1946 [id number]  Second telephone call to patient regarding EMMI heart failure referral. Unable to reach patient at listed home number.  Attempted alternate phone number listed. Message states, "wireless customer is not available."  PLAN;  RNCM will attempt 2nd telephone call to patient within 1 week.  Quinn Plowman RN,BSN,CCM [facility] Telephonic  [phone]

## 2016-04-10 NOTE — Telephone Encounter (Signed)
Noted  

## 2016-04-12 ENCOUNTER — Ambulatory Visit: Payer: Self-pay

## 2016-04-12 ENCOUNTER — Telehealth (HOSPITAL_COMMUNITY): Payer: Self-pay | Admitting: Cardiology

## 2016-04-12 NOTE — Telephone Encounter (Signed)
Katie called to report patients blood pressure is elevated today during [facility] visit 130/100 Patient has not taken afternoon doses of medications Patient will be running out of some medications coreg,isosorbide and will not be able to get refilled until 5/3

## 2016-04-13 ENCOUNTER — Other Ambulatory Visit: Payer: Self-pay

## 2016-04-13 DIAGNOSIS — I2510 Atherosclerotic heart disease of native coronary artery without angina pectoris: Secondary | ICD-10-CM | POA: Diagnosis not present

## 2016-04-13 DIAGNOSIS — I482 Chronic atrial fibrillation: Secondary | ICD-10-CM | POA: Diagnosis not present

## 2016-04-13 DIAGNOSIS — I5022 Chronic systolic (congestive) heart failure: Secondary | ICD-10-CM | POA: Diagnosis not present

## 2016-04-13 DIAGNOSIS — M1991 Primary osteoarthritis, unspecified site: Secondary | ICD-10-CM | POA: Diagnosis not present

## 2016-04-13 DIAGNOSIS — I110 Hypertensive heart disease with heart failure: Secondary | ICD-10-CM | POA: Diagnosis not present

## 2016-04-13 DIAGNOSIS — Z87891 Personal history of nicotine dependence: Secondary | ICD-10-CM | POA: Diagnosis not present

## 2016-04-13 DIAGNOSIS — Z950 Presence of cardiac pacemaker: Secondary | ICD-10-CM | POA: Diagnosis not present

## 2016-04-13 DIAGNOSIS — I255 Ischemic cardiomyopathy: Secondary | ICD-10-CM | POA: Diagnosis not present

## 2016-04-13 DIAGNOSIS — Z7952 Long term (current) use of systemic steroids: Secondary | ICD-10-CM | POA: Diagnosis not present

## 2016-04-13 DIAGNOSIS — E785 Hyperlipidemia, unspecified: Secondary | ICD-10-CM | POA: Diagnosis not present

## 2016-04-13 DIAGNOSIS — Z7901 Long term (current) use of anticoagulants: Secondary | ICD-10-CM | POA: Diagnosis not present

## 2016-04-13 NOTE — Patient Outreach (Signed)
[facility]) Care Management  04/13/2016  [facility]. 1946-06-03 [id number]   SUBJECTIVE;  Telephone call to patient regarding EMMI heart failure RED referral.  HIPAA verified with patient.  Patient states  He was having trouble affording his gout medication due to it costing over $100.00.  Patient states Louis Ford with the paramedics program and who comes to his home to see him assisted him with his medication.  Patient states his gout medication cost him $45 per month now. Patient states he is able to afford this Patient states he has all of his medications which will last him until May 3rd, 2017. Patient states he will have his medications filled as it gets closer to that date.  Patient denies having any shortness of breath, chest pain, or swelling. Patient states he continues to weigh himself daily and records.  Patient states his weight today is 233 lbs.  Patient states he has an appointment to see Dr. Sung Ford on tomorrow, 04/13/16.   Patient reports he continues to receive home physical therapy from [facility]. Patient states he saw his primary MD approximately 1-2 weeks ago.  This RNCM discussed and offered THn care management services to patient. Patient declined services. Patient states he feels he is doing ok.  Patient states he has Scientist, research (physical sciences) with paramedics and his physical therapist checking on him for now.   ASSESSMENT:  EMMI congestive heart failure program  PLAN; RNCM will refer patient to Louis Ford to close due to refusal of services.  RNCM will notify patients primary MD of refusal of services.  RNCM will send patient [facility] care management outreach letter and brochure in the event services are needed in the future.   Louis Plowman RN,BSN,CCM [facility] Telephonic  [phone]

## 2016-04-16 ENCOUNTER — Encounter (HOSPITAL_COMMUNITY): Payer: Self-pay

## 2016-04-16 ENCOUNTER — Encounter (HOSPITAL_COMMUNITY): Payer: Medicare Other | Admitting: Internal Medicine

## 2016-04-16 ENCOUNTER — Ambulatory Visit (HOSPITAL_COMMUNITY)
Admission: RE | Admit: 2016-04-16 | Discharge: 2016-04-16 | Disposition: A | Discharge status: Home / Self Care | Payer: Medicare Other | Source: Ambulatory Visit | Attending: Internal Medicine | Admitting: Internal Medicine

## 2016-04-16 VITALS — BP 120/80 | HR 79 | Wt 231.2 lb

## 2016-04-16 DIAGNOSIS — Z8673 Personal history of transient ischemic attack (TIA), and cerebral infarction without residual deficits: Secondary | ICD-10-CM | POA: Insufficient documentation

## 2016-04-16 DIAGNOSIS — G40909 Epilepsy, unspecified, not intractable, without status epilepticus: Secondary | ICD-10-CM | POA: Insufficient documentation

## 2016-04-16 DIAGNOSIS — I255 Ischemic cardiomyopathy: Secondary | ICD-10-CM | POA: Diagnosis not present

## 2016-04-16 DIAGNOSIS — I5022 Chronic systolic (congestive) heart failure: Secondary | ICD-10-CM

## 2016-04-16 DIAGNOSIS — Z95810 Presence of automatic (implantable) cardiac defibrillator: Secondary | ICD-10-CM | POA: Diagnosis not present

## 2016-04-16 DIAGNOSIS — D682 Hereditary deficiency of other clotting factors: Secondary | ICD-10-CM | POA: Diagnosis not present

## 2016-04-16 DIAGNOSIS — Z7901 Long term (current) use of anticoagulants: Secondary | ICD-10-CM | POA: Diagnosis not present

## 2016-04-16 DIAGNOSIS — M109 Gout, unspecified: Secondary | ICD-10-CM | POA: Diagnosis not present

## 2016-04-16 DIAGNOSIS — N183 Chronic kidney disease, stage 3 (moderate): Secondary | ICD-10-CM | POA: Insufficient documentation

## 2016-04-16 DIAGNOSIS — Z79899 Other long term (current) drug therapy: Secondary | ICD-10-CM | POA: Diagnosis not present

## 2016-04-16 DIAGNOSIS — F819 Developmental disorder of scholastic skills, unspecified: Secondary | ICD-10-CM | POA: Insufficient documentation

## 2016-04-16 DIAGNOSIS — E785 Hyperlipidemia, unspecified: Secondary | ICD-10-CM | POA: Insufficient documentation

## 2016-04-16 DIAGNOSIS — I130 Hypertensive heart and chronic kidney disease with heart failure and stage 1 through stage 4 chronic kidney disease, or unspecified chronic kidney disease: Secondary | ICD-10-CM | POA: Diagnosis not present

## 2016-04-16 DIAGNOSIS — I2510 Atherosclerotic heart disease of native coronary artery without angina pectoris: Secondary | ICD-10-CM | POA: Diagnosis not present

## 2016-04-16 DIAGNOSIS — Z8614 Personal history of Methicillin resistant Staphylococcus aureus infection: Secondary | ICD-10-CM | POA: Insufficient documentation

## 2016-04-16 DIAGNOSIS — Z8249 Family history of ischemic heart disease and other diseases of the circulatory system: Secondary | ICD-10-CM | POA: Diagnosis not present

## 2016-04-16 DIAGNOSIS — Z951 Presence of aortocoronary bypass graft: Secondary | ICD-10-CM | POA: Diagnosis not present

## 2016-04-16 DIAGNOSIS — Z87891 Personal history of nicotine dependence: Secondary | ICD-10-CM | POA: Insufficient documentation

## 2016-04-16 DIAGNOSIS — I480 Paroxysmal atrial fibrillation: Secondary | ICD-10-CM

## 2016-04-16 DIAGNOSIS — I10 Essential (primary) hypertension: Secondary | ICD-10-CM | POA: Diagnosis not present

## 2016-04-16 NOTE — Progress Notes (Signed)
Patient ID: Louis Ford., male   DOB: April 12, 1946, 70 y.o.   MRN: [id number] Patient ID: Louis Ford., male   DOB: Apr 02, 1946, 70 y.o.   MRN: [id number]    Advanced Heart Failure Clinic Note   Primary Care: Dr Pricilla Holm Primary Cardiologist: Dr Lovena Le Primary HF: Dr. Haroldine Laws  HPI: [facility] Louis Ford. is a 70 y.o. male with hx of CAD s/p CABG, ICM s/p Biotronik ICD, chronic systolic CHF Echo [id number] LVEF 30-35%; normal RV; PA peak pressure 34 mm Hg, HTN, HLD, atrial fibrillation, previous stroke, Factor 7 deficiency, and seizure disorder who presented to [facility] office on 10/04/15 with worsening SOB and Afib RVR and was admitted for same with 40 lb weight gain over the past year.   He diuresed well initially on IV lasix 40 mg BID, but continued to have SOB and was thus on milrinoneon 10/14 for concerns of low output. RHC on 10/12/15 that showed depressed cardiac output with Fick CO/CI 3.6/1.6. Improved on milrinone and remained stable with wean with CO-OX 60-70%. Bidil started and increase to goal dose.Also started on amio for afib rate control. D/c'd to [facility]. Overall out 7.6 L and down 15 lbs. D/c weight 229 lbs  Admitted 02/26/16 after two near syncopal episodes. ICD interrogation negative for any acute events. Suspect related to overdiuresis and hypotension with recurrent Afib.  Spontaneously converted to NSR without intervention.   He returns for HF follow up. Overall feeling good. Denies SOB/PND/Orthopnea. Weight at home 231 pounds. Having trouble paying for medications. Followed by Paramedicine. Has a roommate. Requires assistance with transportation by [facility].   RHC 10/12/15 RA = 7 RV = 28/1/8 PA = 32/15 (20) PCW = 12 Fick cardiac output/index = 3.6/1.6 Thermo cardiac output/index = 3.5/1.5 PVR = 2.2 WU PA sat = 44%, 47% Ao sat = 98%  Echo 10/05/15 LVEF 30-35%, Mild MR, PA peak pressure 34 mmHg Echo 02/27/16 LVEF 55-60%, Grade 2 DD dysfunction. LAE mildly  dilated, PA peak pressure 35 mm Hg.   Labs: 12/30/15 K 5.0, creatinine 1.3 Labs: 02/29/16 K 4.7, creatinine 1.27 Labs: 03/25/2016: K 4.3 Creatinine 1.31   Past Medical History  Diagnosis Date  . CAD (coronary artery disease)   . Chronic systolic heart failure ([place])   . Ischemic cardiomyopathy     EF 15 to 20% by TTE and TEE in 09/2012.  Severe LV dysfunction  . HTN (hypertension)   . Hyperlipidemia   . Obesity     BMI 31 in 09/2012  . Atrial fibrillation   09/22/2012  . implantable cardiac defibrillator-Biotronik     Device Implanted 2006; s/p gen change 03/2011 : bleeding persistent with pocket erosion and infection; explant and reimplant  06/2011  . Stroke ([place])   . 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 History of MRSA infection 05/2011  . Seizure disorder ([place]) latest 09/30/2012  . 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 Seizures ([place])     Current Outpatient Prescriptions  Medication Sig Dispense Refill  . acetaminophen (TYLENOL) 325 MG tablet Take 2 tablets (650 mg total) by mouth every 6 (six) hours as needed for mild pain. 100 tablet 6  . amiodarone (PACERONE) 200 MG tablet Take 200 mg by mouth daily.    . carvedilol (COREG) 3.125 MG tablet Take 1 tablet (3.125 mg total) by mouth 2 (two) times daily. 60 tablet 6  . colchicine 0.6 MG tablet Take 1 tablet (0.6 mg total) by mouth daily. 30 tablet 0  .  colchicine 0.6 MG tablet Take 1 tablet (0.6 mg total) by mouth daily. 30 tablet 6  . diclofenac sodium (VOLTAREN) 1 % GEL Apply 2 g topically 4 (four) times daily. 200 g 6  . furosemide (LASIX) 40 MG tablet Take 1 tablet (40 mg total) by mouth daily. 30 tablet 3  . hydrALAZINE (APRESOLINE) 25 MG tablet Take 3 tablets (75 mg total) by mouth 3 (three) times daily. 270 tablet 3  . isosorbide dinitrate (ISOCHRON) 40 MG CR tablet Take 1 tablet (40 mg total) by mouth 3 (three) times daily. 90 tablet 6  . losartan (COZAAR) 25 MG tablet Take 1 tablet (25 mg total) by mouth daily. 90 tablet 3   . magnesium oxide (MAG-OX) 400 (241.3 Mg) MG tablet Take 0.5 tablets (200 mg total) by mouth 2 (two) times daily. (Patient not taking: Reported on 04/13/2016) 10 tablet 0  . phenytoin (DILANTIN) 100 MG ER capsule Take 1 capsule (100 mg total) by mouth 2 (two) times daily. 60 capsule 0  . simvastatin (ZOCOR) 20 MG tablet Take 1 tablet (20 mg total) by mouth daily at 6 PM. 90 tablet 3  . spironolactone (ALDACTONE) 25 MG tablet Take 12.5 mg by mouth daily.    [name] warfarin (COUMADIN) 2 MG tablet Take one tablet Mondays and fridays. Take 2 tablet on Sun, Tue, Wed, Thu, Sat. (Patient taking differently: Take 2-4 mg by mouth daily at 6 PM. Takes 2mg  on mon, wed and fri takes 4mg  all other days) 30 tablet 0   No current facility-administered medications for this encounter.    No Known Allergies    Social History   Social History  . Marital Status: Single    Spouse Name: N/A  . Number of Children: N/A  . Years of Education: N/A   Occupational History  . Not on file.   Social History Main Topics  . Smoking status: Former Smoker    Types: Pipe    Quit date: 09/21/2008  . Smokeless tobacco: Former Systems developer    Quit date: 09/21/2008  . Alcohol Use: No  . Drug Use: No  . Sexual Activity: No   Other Topics Concern  . Not on file   Social History Narrative   ** Merged History Encounter **          Family History  Problem Relation Age of Onset  . Arthritis Mother   . Heart disease Mother   . Other Father     smoker  . Heart attack Mother   . Hypertension Neg Hx     unknown  . Stroke Neg Hx     unknown    Filed Vitals:   04/16/16 1126  BP: 120/80  Pulse: 79  Weight: 231 lb 4 oz (104.894 kg)  SpO2: 97%   Wt Readings from Last 3 Encounters:  04/16/16 231 lb 4 oz (104.894 kg)  04/13/16 233 lb (105.688 kg)  04/02/16 233 lb 6.4 oz (105.87 kg)     PHYSICAL EXAM: General:  Well appearing. No respiratory difficulty HEENT: normal Neck: supple. JVD flat. Carotids 2+ bilat; no  bruits. No thyromegaly or nodule noted. Cor: PMI nondisplaced. Regular rhythm. No M/G/R. Lungs: CTAB, normal effort. Abdomen: soft, NT, ND, no HSM. No bruits or masses. +BS  Extremities: no cyanosis, clubbing, rash. Trace edema  Neuro: alert & oriented x 3, cranial nerves grossly intact. moves all 4 extremities w/o difficulty. Affect pleasant.  ASSESSMENT & PLAN:  1. Chronic Systolic CHF, Echo [id number] Echo improved to  55-60% from echo 10/05/15 LVEF 30-35%  - NYHA II. Volume status stable. Continue lasix 40 mg daily.   - Continue carvedilol 3.125 bid - Continue spiro 12.5mg  daily. Continue losartan 25 mg daily.  -Continue hydralazine 75 mg tid and 40 mg isorodil tid.  -Recent BMET stable.  Today we wanted to switch to bidil and set up with [facility] however he does not have his required information therefore we provided him with the phone number for [facility].   2. Paroxysmal Atrial Fibrillation  - Continue amiodarone 200 mg daily. On coumadin. No bleeding problems.  INR followed at the coumadin clinic. Recent TSH ok. Needs yearly eye exams. .   3. CAD s/p CABG 4. HTN- Well-controlled 5. CKD stage III 6. Gout - On allopurinol 200 mg daily. Followed by PCP  7. Learning disability  8. Near syncope- Resolved.     Follow up in 2 months. Continue paramedicine.    Amy Ninfa Meeker, NP-C  11:33 AM

## 2016-04-16 NOTE — Progress Notes (Signed)
Advanced Heart Failure Medication Review by a Pharmacist  Does the patient  feel that his/her medications are working for him/her?  yes  Has the patient been experiencing any side effects to the medications prescribed?  no  Does the patient measure his/her own blood pressure or blood glucose at home?  no   Does the patient have any problems obtaining medications due to transportation or finances?   yes  Understanding of regimen: good Understanding of indications: good Potential of compliance: good Patient understands to avoid NSAIDs. Patient understands to avoid decongestants.  Issues to address at subsequent visits: [employer] coverage   Pharmacist comments: Mr. Kinzie presents to HF clinic for f/u. Has been taking all medications as scheduled, however has run out of hydralazine and carvedilol. Last dose hydralazine last night, and last carvedilol dose this morning. Will not be able to get refill until May 3rd due to transportation issues and not receiving monthly pension until then. Pt has [employer] and may benefit from enrollment in [employer]. However, he does not know his social security number or income information and referred me to his pastor, Marcell Barlow [phone]) for this information.   After speaking to [place] Bailiff, she was uncomfortable relaying his personal information over the phone and did not readily have his information on hand. We therefore included the [employer] website information and enrollment line phone number on his after visit summary so that Mr. Roelfs can apply for enrollment at home. Once he has started the enrollment process for [facility], we can switch his hydralazine and isosorbide dinitrate to Bidil and have the rest of his HF medications covered through the fund.   Heloise Ochoa, [place].D., BCPS PGY2 Cardiology Pharmacy Resident Pager: [phone]    Time with patient: 10 min Preparation and documentation  time: 20 min Total time: 30 min

## 2016-04-16 NOTE — Patient Instructions (Addendum)
Follow up with PA/NP clinic in 2 months.   You can apply for patient access network foundation assistance at [email or web address] or call [phone]. This is a foundation that can help you with medication coverage.

## 2016-04-17 NOTE — Telephone Encounter (Signed)
Patient seen in office 4/24- information given to patient regarding patient assistance programs for co pay

## 2016-04-18 ENCOUNTER — Ambulatory Visit (INDEPENDENT_AMBULATORY_CARE_PROVIDER_SITE_OTHER): Payer: Medicare Other | Admitting: Pharmacist

## 2016-04-18 DIAGNOSIS — Z5181 Encounter for therapeutic drug level monitoring: Secondary | ICD-10-CM

## 2016-04-18 DIAGNOSIS — Z7901 Long term (current) use of anticoagulants: Secondary | ICD-10-CM

## 2016-04-18 DIAGNOSIS — I4891 Unspecified atrial fibrillation: Secondary | ICD-10-CM | POA: Diagnosis not present

## 2016-04-18 LAB — POCT INR: INR: 2.8

## 2016-04-19 DIAGNOSIS — Z7901 Long term (current) use of anticoagulants: Secondary | ICD-10-CM | POA: Diagnosis not present

## 2016-04-19 DIAGNOSIS — I2510 Atherosclerotic heart disease of native coronary artery without angina pectoris: Secondary | ICD-10-CM | POA: Diagnosis not present

## 2016-04-19 DIAGNOSIS — I482 Chronic atrial fibrillation: Secondary | ICD-10-CM | POA: Diagnosis not present

## 2016-04-19 DIAGNOSIS — M1991 Primary osteoarthritis, unspecified site: Secondary | ICD-10-CM | POA: Diagnosis not present

## 2016-04-19 DIAGNOSIS — Z87891 Personal history of nicotine dependence: Secondary | ICD-10-CM | POA: Diagnosis not present

## 2016-04-19 DIAGNOSIS — Z7952 Long term (current) use of systemic steroids: Secondary | ICD-10-CM | POA: Diagnosis not present

## 2016-04-19 DIAGNOSIS — E785 Hyperlipidemia, unspecified: Secondary | ICD-10-CM | POA: Diagnosis not present

## 2016-04-19 DIAGNOSIS — I110 Hypertensive heart disease with heart failure: Secondary | ICD-10-CM | POA: Diagnosis not present

## 2016-04-19 DIAGNOSIS — I5022 Chronic systolic (congestive) heart failure: Secondary | ICD-10-CM | POA: Diagnosis not present

## 2016-04-19 DIAGNOSIS — Z950 Presence of cardiac pacemaker: Secondary | ICD-10-CM | POA: Diagnosis not present

## 2016-04-19 DIAGNOSIS — I255 Ischemic cardiomyopathy: Secondary | ICD-10-CM | POA: Diagnosis not present

## 2016-04-20 DIAGNOSIS — I482 Chronic atrial fibrillation: Secondary | ICD-10-CM | POA: Diagnosis not present

## 2016-04-20 DIAGNOSIS — M1991 Primary osteoarthritis, unspecified site: Secondary | ICD-10-CM | POA: Diagnosis not present

## 2016-04-20 DIAGNOSIS — Z7901 Long term (current) use of anticoagulants: Secondary | ICD-10-CM | POA: Diagnosis not present

## 2016-04-20 DIAGNOSIS — I5022 Chronic systolic (congestive) heart failure: Secondary | ICD-10-CM | POA: Diagnosis not present

## 2016-04-20 DIAGNOSIS — Z87891 Personal history of nicotine dependence: Secondary | ICD-10-CM | POA: Diagnosis not present

## 2016-04-20 DIAGNOSIS — Z950 Presence of cardiac pacemaker: Secondary | ICD-10-CM | POA: Diagnosis not present

## 2016-04-20 DIAGNOSIS — I110 Hypertensive heart disease with heart failure: Secondary | ICD-10-CM | POA: Diagnosis not present

## 2016-04-20 DIAGNOSIS — E785 Hyperlipidemia, unspecified: Secondary | ICD-10-CM | POA: Diagnosis not present

## 2016-04-20 DIAGNOSIS — Z7952 Long term (current) use of systemic steroids: Secondary | ICD-10-CM | POA: Diagnosis not present

## 2016-04-20 DIAGNOSIS — I255 Ischemic cardiomyopathy: Secondary | ICD-10-CM | POA: Diagnosis not present

## 2016-04-20 DIAGNOSIS — I2510 Atherosclerotic heart disease of native coronary artery without angina pectoris: Secondary | ICD-10-CM | POA: Diagnosis not present

## 2016-04-23 DIAGNOSIS — Z7901 Long term (current) use of anticoagulants: Secondary | ICD-10-CM | POA: Diagnosis not present

## 2016-04-23 DIAGNOSIS — Z7952 Long term (current) use of systemic steroids: Secondary | ICD-10-CM | POA: Diagnosis not present

## 2016-04-23 DIAGNOSIS — I2510 Atherosclerotic heart disease of native coronary artery without angina pectoris: Secondary | ICD-10-CM | POA: Diagnosis not present

## 2016-04-23 DIAGNOSIS — I5022 Chronic systolic (congestive) heart failure: Secondary | ICD-10-CM | POA: Diagnosis not present

## 2016-04-23 DIAGNOSIS — M1991 Primary osteoarthritis, unspecified site: Secondary | ICD-10-CM | POA: Diagnosis not present

## 2016-04-23 DIAGNOSIS — I482 Chronic atrial fibrillation: Secondary | ICD-10-CM | POA: Diagnosis not present

## 2016-04-23 DIAGNOSIS — Z87891 Personal history of nicotine dependence: Secondary | ICD-10-CM | POA: Diagnosis not present

## 2016-04-23 DIAGNOSIS — Z950 Presence of cardiac pacemaker: Secondary | ICD-10-CM | POA: Diagnosis not present

## 2016-04-23 DIAGNOSIS — I110 Hypertensive heart disease with heart failure: Secondary | ICD-10-CM | POA: Diagnosis not present

## 2016-04-23 DIAGNOSIS — E785 Hyperlipidemia, unspecified: Secondary | ICD-10-CM | POA: Diagnosis not present

## 2016-04-23 DIAGNOSIS — I255 Ischemic cardiomyopathy: Secondary | ICD-10-CM | POA: Diagnosis not present

## 2016-04-24 ENCOUNTER — Telehealth: Payer: Self-pay | Admitting: Physician Assistant

## 2016-04-24 ENCOUNTER — Other Ambulatory Visit: Payer: Self-pay | Admitting: Geriatric Medicine

## 2016-04-24 ENCOUNTER — Telehealth: Payer: Self-pay | Admitting: Internal Medicine

## 2016-04-24 MED ORDER — AMIODARONE HCL 200 MG PO TABS: 200.0000 mg | ORAL_TABLET | Freq: Every day | ORAL | Status: DC

## 2016-04-24 NOTE — Telephone Encounter (Signed)
Sent to pharmacy 

## 2016-04-24 NOTE — Telephone Encounter (Signed)
Pt request refill for amiodarone (PACERONE) 200 to be send to [facility] on [address]. Please help

## 2016-04-24 NOTE — Telephone Encounter (Signed)
Pt called because the light on his device monitor went from green>>brown>>green.  He was asymptomatic. The device did not fire.  Advised him I felt he was OK, continue current checks. Call for symptoms or if the device goes off. He agrees with the plan.  Lenoard Aden 04/24/2016 7:49 PM Beeper [phone]

## 2016-04-26 NOTE — Telephone Encounter (Signed)
LMOVM for pt to call me back in regards to his home monitor.

## 2016-04-27 DIAGNOSIS — Z950 Presence of cardiac pacemaker: Secondary | ICD-10-CM | POA: Diagnosis not present

## 2016-04-27 DIAGNOSIS — Z7901 Long term (current) use of anticoagulants: Secondary | ICD-10-CM | POA: Diagnosis not present

## 2016-04-27 DIAGNOSIS — I255 Ischemic cardiomyopathy: Secondary | ICD-10-CM | POA: Diagnosis not present

## 2016-04-27 DIAGNOSIS — I2510 Atherosclerotic heart disease of native coronary artery without angina pectoris: Secondary | ICD-10-CM | POA: Diagnosis not present

## 2016-04-27 DIAGNOSIS — E785 Hyperlipidemia, unspecified: Secondary | ICD-10-CM | POA: Diagnosis not present

## 2016-04-27 DIAGNOSIS — Z7952 Long term (current) use of systemic steroids: Secondary | ICD-10-CM | POA: Diagnosis not present

## 2016-04-27 DIAGNOSIS — Z87891 Personal history of nicotine dependence: Secondary | ICD-10-CM | POA: Diagnosis not present

## 2016-04-27 DIAGNOSIS — I5022 Chronic systolic (congestive) heart failure: Secondary | ICD-10-CM | POA: Diagnosis not present

## 2016-04-27 DIAGNOSIS — M1991 Primary osteoarthritis, unspecified site: Secondary | ICD-10-CM | POA: Diagnosis not present

## 2016-04-27 DIAGNOSIS — I482 Chronic atrial fibrillation: Secondary | ICD-10-CM | POA: Diagnosis not present

## 2016-04-27 DIAGNOSIS — I110 Hypertensive heart disease with heart failure: Secondary | ICD-10-CM | POA: Diagnosis not present

## 2016-04-27 NOTE — Telephone Encounter (Signed)
Spoke w/ pt and informed him that his home monitor is updating daily and that we have received 0 alerts. Pt verbalized understanding.

## 2016-04-27 NOTE — Telephone Encounter (Signed)
2nd attempt.   LMOVM for pt to return my call in regards to his home monitor.

## 2016-05-02 ENCOUNTER — Ambulatory Visit (INDEPENDENT_AMBULATORY_CARE_PROVIDER_SITE_OTHER): Payer: Medicare Other | Admitting: *Deleted

## 2016-05-02 DIAGNOSIS — Z5181 Encounter for therapeutic drug level monitoring: Secondary | ICD-10-CM

## 2016-05-02 DIAGNOSIS — Z7901 Long term (current) use of anticoagulants: Secondary | ICD-10-CM

## 2016-05-02 DIAGNOSIS — I4891 Unspecified atrial fibrillation: Secondary | ICD-10-CM

## 2016-05-02 LAB — POCT INR: INR: 4.3

## 2016-05-16 ENCOUNTER — Ambulatory Visit (INDEPENDENT_AMBULATORY_CARE_PROVIDER_SITE_OTHER): Payer: Medicare Other | Admitting: *Deleted

## 2016-05-16 ENCOUNTER — Telehealth: Payer: Self-pay

## 2016-05-16 DIAGNOSIS — Z5181 Encounter for therapeutic drug level monitoring: Secondary | ICD-10-CM | POA: Diagnosis not present

## 2016-05-16 DIAGNOSIS — Z7901 Long term (current) use of anticoagulants: Secondary | ICD-10-CM

## 2016-05-16 DIAGNOSIS — I4891 Unspecified atrial fibrillation: Secondary | ICD-10-CM

## 2016-05-16 LAB — POCT INR: INR: 4.9

## 2016-05-16 NOTE — Telephone Encounter (Signed)
Called and spoke with patient. He is going to call the number on his insurance card to see if they can give him a dentist in network.

## 2016-05-16 NOTE — Telephone Encounter (Signed)
Patient called and said he is having a lot of problems with his mouth. He was would like a dentist if you know of one. Thank you.

## 2016-05-17 ENCOUNTER — Encounter: Payer: Self-pay | Admitting: Cardiology

## 2016-05-17 LAB — CUP PACEART REMOTE DEVICE CHECK
HighPow Impedance: 68 Ohm
Implantable Lead Serial Number: 10260064
Lead Channel Impedance Value: 509 Ohm
Lead Channel Pacing Threshold Amplitude: 0.5 V
Lead Channel Pacing Threshold Pulse Width: 0.4 ms
Lead Channel Sensing Intrinsic Amplitude: 13.2 mV
Lead Channel Setting Sensing Sensitivity: 0.8 mV
MDC IDC LEAD IMPLANT DT: 20060927
MDC IDC LEAD LOCATION: 753860
MDC IDC SESS DTM: 20170525115012
MDC IDC SET LEADCHNL RV PACING AMPLITUDE: 2 V
MDC IDC SET LEADCHNL RV PACING PULSEWIDTH: 0.4 ms
MDC IDC STAT BRADY RV PERCENT PACED: 0 %
Pulse Gen Serial Number: 60613417

## 2016-05-18 ENCOUNTER — Ambulatory Visit (INDEPENDENT_AMBULATORY_CARE_PROVIDER_SITE_OTHER): Payer: Medicare Other | Admitting: Family

## 2016-05-18 ENCOUNTER — Encounter: Payer: Self-pay | Admitting: Family

## 2016-05-18 VITALS — BP 128/90 | HR 81 | Temp 98.2°F | Resp 16 | Ht 70.0 in | Wt 218.0 lb

## 2016-05-18 DIAGNOSIS — K047 Periapical abscess without sinus: Secondary | ICD-10-CM | POA: Insufficient documentation

## 2016-05-18 DIAGNOSIS — K122 Cellulitis and abscess of mouth: Secondary | ICD-10-CM | POA: Diagnosis not present

## 2016-05-18 DIAGNOSIS — K056 Periodontal disease, unspecified: Secondary | ICD-10-CM | POA: Insufficient documentation

## 2016-05-18 MED ORDER — MAGIC MOUTHWASH: ORAL | Status: DC

## 2016-05-18 MED ORDER — AMOXICILLIN-POT CLAVULANATE 875-125 MG PO TABS: 1.0000 | ORAL_TABLET | Freq: Two times a day (BID) | ORAL | Status: DC

## 2016-05-18 NOTE — Assessment & Plan Note (Signed)
Start Augmentin to cover for oral infection. No fevers. Recommend follow up with dentistry as soon as possible.

## 2016-05-18 NOTE — Progress Notes (Signed)
Subjective:    Patient ID: Louis Ford., male    DOB: 1946/06/09, 70 y.o.   MRN: [id number]  Chief Complaint  Patient presents with  . Oral Pain    x3 days states that he has pain in the right side of his mouth sores on the right side of his tongue, does not brush his teeth on a daily basis and does not have back teeth but does use mouth wash    HPI:  [facility]. is a 70 y.o. 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 and presents today for an acute office visit.  This is a new problem. Associated symptom of pain and sores located in his mouth hemming going on for approximately 3 days. Reports a history of poor dentition and does not brush teeth on a daily basis. Denies fevers. Pain is described as sharp and "feels like he has a needle." Modifying factors include salt water garggles which helped a little with his pain. 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 Denies any trauma. Aggravated by pressure.   No Known Allergies   Current Outpatient Prescriptions on File Prior to Visit  Medication Sig Dispense Refill  . amiodarone (PACERONE) 200 MG tablet Take 1 tablet (200 mg total) by mouth daily. 30 tablet 3  . carvedilol (COREG) 3.125 MG tablet Take 1 tablet (3.125 mg total) by mouth 2 (two) times daily. 60 tablet 6  . colchicine 0.6 MG tablet Take 1 tablet (0.6 mg total) by mouth daily. 30 tablet 6  . furosemide (LASIX) 40 MG tablet Take 1 tablet (40 mg total) by mouth daily. 30 tablet 3  . hydrALAZINE (APRESOLINE) 25 MG tablet Take 3 tablets (75 mg total) by mouth 3 (three) times  daily. 270 tablet 3  . isosorbide dinitrate (ISOCHRON) 40 MG CR tablet Take 1 tablet (40 mg total) by mouth 3 (three) times daily. 90 tablet 6  . losartan (COZAAR) 25 MG tablet Take 1 tablet (25 mg total) by mouth daily. 90 tablet 3  . phenytoin (DILANTIN) 100 MG ER capsule Take 1 capsule (100 mg total) by mouth 2 (two) times daily. 60 capsule 0  . simvastatin (ZOCOR) 20 MG tablet Take 1 tablet (20 mg total) by mouth daily at 6 PM. 90 tablet 3  . spironolactone (ALDACTONE) 25 MG tablet Take 12.5 mg by mouth daily.    [name] warfarin (COUMADIN) 4 MG tablet Take 4 mg by mouth daily.     No current facility-administered medications on file prior to visit.     Past Surgical History  Procedure Laterality Date  . Coronary artery bypass graft    . Tee without cardioversion  09/24/2012    Procedure: TRANSESOPHAGEAL ECHOCARDIOGRAM (TEE);  Surgeon: [name], MD;  Location: [facility];  Service: Cardiovascular;  Laterality: N/A;  dave/anesth, dl, cindy/echo   . Cardioversion  09/24/2012    Procedure: CARDIOVERSION;  Surgeon: [name], MD;  Location: [facility] ENDOSCOPY;  Service: Cardiovascular;  Laterality: N/A;  . Icd    . Cardiac catheterization N/A 10/12/2015    Procedure: Right Heart Cath;  Surgeon: [name], MD;  Location: [facility] CV LAB;  Service: Cardiovascular;  Laterality: N/A;  Review of Systems  Constitutional: Negative for fever and chills.  HENT: Positive for dental problem.        Positive for mouth pain      Objective:    BP 128/90 mmHg  Pulse 81  Temp(Src) 98.2 F (36.8 C) (Oral)  Resp 16  Ht 5\' 10"  (1.778 m)  Wt 218 lb (98.884 kg)  BMI 31.28 kg/m2  SpO2 97% Nursing note and vital signs reviewed.  Physical Exam  Constitutional: He is oriented to person, place, and time. He appears well-developed and well-nourished. No distress.  HENT:  Poor dentition with what appears to be severe tooth infection and periodontal disease. No abscess noted. There  is a mild white coating and bright redness of the tongue.   Cardiovascular: Normal rate, regular rhythm, normal heart sounds and intact distal pulses.   Pulmonary/Chest: Effort normal and breath sounds normal.  Neurological: He is alert and oriented to person, place, and time.  Skin: Skin is warm and dry.  Psychiatric: He has a normal mood and affect. His behavior is normal. Judgment and thought content normal.       Assessment & Plan:   Problem List Items Addressed This Visit      Digestive   Periodontal disease - Primary    Severe periodontal disease and poor dentition most likely contributing to oral infection. Start augmentin to cover for infection. Will most likely require dental surgery. Start Magic Mouth wash. Follow up with dentistry.       Relevant Medications   magic mouthwash SOLN   amoxicillin-clavulanate (AUGMENTIN) 875-125 MG tablet   Oral infection    Start Augmentin to cover for oral infection. No fevers. Recommend follow up with dentistry as soon as possible.       Relevant Medications   magic mouthwash SOLN   amoxicillin-clavulanate (AUGMENTIN) 875-125 MG tablet       I have discontinued Mr. Slane's acetaminophen and diclofenac sodium. I am also having him start on magic mouthwash and amoxicillin-clavulanate. Additionally, I am having him maintain his carvedilol, phenytoin, furosemide, spironolactone, isosorbide dinitrate, losartan, hydrALAZINE, colchicine, simvastatin, warfarin, and amiodarone.   Meds ordered this encounter  Medications  . magic mouthwash SOLN    Sig: Swish, gargle, and spit one to two teaspoonfuls every six hours as needed    Dispense:  240 mL    Refill:  1    40 ml viscous lidocaine 2%  40 ml Mylanta 40 ml diphenhydramine 12.5 mg per 5 ml elixir 40 ml nystatin 100,000U suspension 40 ml prednisolone 15mg  per 57ml solution   80 ml distilled water    Order Specific Question:  Supervising Provider    Answer:  Pricilla Holm A  [id number]  . amoxicillin-clavulanate (AUGMENTIN) 875-125 MG tablet    Sig: Take 1 tablet by mouth 2 (two) times daily.    Dispense:  20 tablet    Refill:  0    Order Specific Question:  Supervising Provider    Answer:  Pricilla Holm A [id number]     Follow-up: Return if symptoms worsen or fail to improve.  Mauricio Po, [place]    Marcell Barlow [phone]

## 2016-05-18 NOTE — Progress Notes (Signed)
Pre visit review using our clinic review tool, if applicable. No additional management support is needed unless otherwise documented below in the visit note. 

## 2016-05-18 NOTE — Assessment & Plan Note (Signed)
Severe periodontal disease and poor dentition most likely contributing to oral infection. Start augmentin to cover for infection. Will most likely require dental surgery. Start Magic Mouth wash. Follow up with dentistry.

## 2016-05-18 NOTE — Patient Instructions (Addendum)
Thank you for choosing [facility].  Summary/Instructions:  We have started a magic mouthwash which has been sent to the pharmacy.  Please start the Augmentin.  Please contact a dentist for follow up.  Your prescription(s) have been submitted to your pharmacy or been printed and provided for you. Please take as directed and contact our office if you believe you are having problem(s) with the medication(s) or have any questions.  If your symptoms worsen or fail to improve, please contact our office for further instruction, or in case of emergency go directly to the emergency room at the closest medical facility.

## 2016-05-31 ENCOUNTER — Telehealth: Payer: Self-pay | Admitting: *Deleted

## 2016-05-31 NOTE — Telephone Encounter (Signed)
Spoke with Joellen Jersey who is paramedic that helps manage pt's heart failure in the home She was calling to say pt is confused over what dose of coumadin he is to take that he followed directions after visit May 24th and he followed these until May 31st but since first of June he has not understood what he should take has been taking 1 tablet one day and the next day taking 1/2 tablet and upon further questioning he states just does not know what he has been taking. Joellen Jersey was calling to ask what he should take tonight so instructed since we do not know what he has taken to take the prescribed dose on last visit for Thursday which is coumadin 4mg  and stressed to pt the importance of his keeping his appt in the coumadin clinic tomorrow June 9th and Joellen Jersey states he said he would be there

## 2016-06-01 ENCOUNTER — Ambulatory Visit (INDEPENDENT_AMBULATORY_CARE_PROVIDER_SITE_OTHER): Payer: Medicare Other

## 2016-06-01 DIAGNOSIS — I4891 Unspecified atrial fibrillation: Secondary | ICD-10-CM

## 2016-06-01 DIAGNOSIS — Z5181 Encounter for therapeutic drug level monitoring: Secondary | ICD-10-CM | POA: Diagnosis not present

## 2016-06-01 DIAGNOSIS — Z7901 Long term (current) use of anticoagulants: Secondary | ICD-10-CM | POA: Diagnosis not present

## 2016-06-01 LAB — POCT INR: INR: 5.8

## 2016-06-01 MED ORDER — WARFARIN SODIUM 4 MG PO TABS: 4.0000 mg | ORAL_TABLET | Freq: Every day | ORAL | Status: DC

## 2016-06-08 ENCOUNTER — Ambulatory Visit (INDEPENDENT_AMBULATORY_CARE_PROVIDER_SITE_OTHER): Payer: Medicare Other | Admitting: *Deleted

## 2016-06-08 DIAGNOSIS — I4891 Unspecified atrial fibrillation: Secondary | ICD-10-CM | POA: Diagnosis not present

## 2016-06-08 DIAGNOSIS — Z7901 Long term (current) use of anticoagulants: Secondary | ICD-10-CM

## 2016-06-08 DIAGNOSIS — Z5181 Encounter for therapeutic drug level monitoring: Secondary | ICD-10-CM

## 2016-06-08 LAB — POCT INR: INR: 2.1

## 2016-06-19 ENCOUNTER — Ambulatory Visit (HOSPITAL_COMMUNITY)
Admission: RE | Admit: 2016-06-19 | Discharge: 2016-06-19 | Disposition: A | Discharge status: Home / Self Care | Payer: Medicare Other | Source: Ambulatory Visit | Attending: Cardiology | Admitting: Cardiology

## 2016-06-19 VITALS — BP 144/100 | HR 84 | Wt 224.6 lb

## 2016-06-19 DIAGNOSIS — M109 Gout, unspecified: Secondary | ICD-10-CM | POA: Diagnosis not present

## 2016-06-19 DIAGNOSIS — N183 Chronic kidney disease, stage 3 (moderate): Secondary | ICD-10-CM | POA: Diagnosis not present

## 2016-06-19 DIAGNOSIS — I2510 Atherosclerotic heart disease of native coronary artery without angina pectoris: Secondary | ICD-10-CM | POA: Insufficient documentation

## 2016-06-19 DIAGNOSIS — I10 Essential (primary) hypertension: Secondary | ICD-10-CM

## 2016-06-19 DIAGNOSIS — R55 Syncope and collapse: Secondary | ICD-10-CM | POA: Diagnosis not present

## 2016-06-19 DIAGNOSIS — Z87891 Personal history of nicotine dependence: Secondary | ICD-10-CM | POA: Insufficient documentation

## 2016-06-19 DIAGNOSIS — Z8249 Family history of ischemic heart disease and other diseases of the circulatory system: Secondary | ICD-10-CM | POA: Diagnosis not present

## 2016-06-19 DIAGNOSIS — G40909 Epilepsy, unspecified, not intractable, without status epilepticus: Secondary | ICD-10-CM | POA: Diagnosis not present

## 2016-06-19 DIAGNOSIS — I480 Paroxysmal atrial fibrillation: Secondary | ICD-10-CM | POA: Diagnosis not present

## 2016-06-19 DIAGNOSIS — Z8673 Personal history of transient ischemic attack (TIA), and cerebral infarction without residual deficits: Secondary | ICD-10-CM | POA: Insufficient documentation

## 2016-06-19 DIAGNOSIS — Z951 Presence of aortocoronary bypass graft: Secondary | ICD-10-CM | POA: Diagnosis not present

## 2016-06-19 DIAGNOSIS — Z7901 Long term (current) use of anticoagulants: Secondary | ICD-10-CM | POA: Insufficient documentation

## 2016-06-19 DIAGNOSIS — I2583 Coronary atherosclerosis due to lipid rich plaque: Secondary | ICD-10-CM

## 2016-06-19 DIAGNOSIS — F819 Developmental disorder of scholastic skills, unspecified: Secondary | ICD-10-CM | POA: Insufficient documentation

## 2016-06-19 DIAGNOSIS — Z95810 Presence of automatic (implantable) cardiac defibrillator: Secondary | ICD-10-CM | POA: Diagnosis not present

## 2016-06-19 DIAGNOSIS — E669 Obesity, unspecified: Secondary | ICD-10-CM | POA: Diagnosis not present

## 2016-06-19 DIAGNOSIS — R0602 Shortness of breath: Secondary | ICD-10-CM | POA: Diagnosis not present

## 2016-06-19 DIAGNOSIS — I255 Ischemic cardiomyopathy: Secondary | ICD-10-CM | POA: Diagnosis not present

## 2016-06-19 DIAGNOSIS — D682 Hereditary deficiency of other clotting factors: Secondary | ICD-10-CM | POA: Diagnosis not present

## 2016-06-19 DIAGNOSIS — I5022 Chronic systolic (congestive) heart failure: Secondary | ICD-10-CM | POA: Insufficient documentation

## 2016-06-19 DIAGNOSIS — Z8614 Personal history of Methicillin resistant Staphylococcus aureus infection: Secondary | ICD-10-CM | POA: Diagnosis not present

## 2016-06-19 DIAGNOSIS — E785 Hyperlipidemia, unspecified: Secondary | ICD-10-CM | POA: Diagnosis not present

## 2016-06-19 DIAGNOSIS — N289 Disorder of kidney and ureter, unspecified: Secondary | ICD-10-CM

## 2016-06-19 DIAGNOSIS — I130 Hypertensive heart and chronic kidney disease with heart failure and stage 1 through stage 4 chronic kidney disease, or unspecified chronic kidney disease: Secondary | ICD-10-CM | POA: Insufficient documentation

## 2016-06-19 LAB — BASIC METABOLIC PANEL
Anion gap: 6 (ref 5–15)
BUN: 16 mg/dL (ref 6–20)
CO2: 27 mmol/L (ref 22–32)
Calcium: 8.7 mg/dL — ABNORMAL LOW (ref 8.9–10.3)
Chloride: 103 mmol/L (ref 101–111)
Creatinine, Ser: 1.2 mg/dL (ref 0.61–1.24)
GFR calc Af Amer: >60 mL/min (ref >60)
GFR calc non Af Amer: >60 mL/min (ref >60)
Glucose, Bld: 81 mg/dL (ref 65–99)
Potassium: 4.5 mmol/L (ref 3.5–5.1)
Sodium: 136 mmol/L (ref 135–145)

## 2016-06-19 MED ORDER — ISOSORB DINITRATE-HYDRALAZINE 20-37.5 MG PO TABS: 2.0000 | ORAL_TABLET | Freq: Three times a day (TID) | ORAL | Status: DC

## 2016-06-19 NOTE — Progress Notes (Signed)
Patient ID: Sonny Dandy., male   DOB: 01/01/1946, 70 y.o.   MRN: [id number]    Advanced Heart Failure Clinic Note   Primary Care: Dr Pricilla Holm Primary Cardiologist: Dr Lovena Le Primary HF: Dr. Haroldine Laws  HPI: [facility] [name] is a 70 y.o. male with hx of CAD s/p CABG, ICM s/p Biotronik ICD, chronic systolic CHF Echo 99991111 LVEF 30-35%; normal RV; PA peak pressure 34 mm Hg, HTN, HLD, atrial fibrillation, previous stroke, Factor 7 deficiency, and seizure disorder who presented to [facility] office on 10/04/15 with worsening SOB and Afib RVR and was admitted for same with 40 lb weight gain over the past year.   He diuresed well initially on IV lasix 40 mg BID, but continued to have SOB and was thus on milrinoneon 10/14 for concerns of low output. RHC on 10/12/15 that showed depressed cardiac output with Fick CO/CI 3.6/1.6. Improved on milrinone and remained stable with wean with CO-OX 60-70%. Bidil started and increase to goal dose.Also started on amio for afib rate control. D/c'd to [facility]. Overall out 7.6 L and down 15 lbs. D/c weight 229 lbs  Admitted 02/26/16 after two near syncopal episodes. ICD interrogation negative for any acute events. Suspect related to overdiuresis and hypotension with recurrent Afib.  Spontaneously converted to NSR without intervention.   He returns today for regular HF follow up. Weight down 7 lbs from last visit. Feels good overall.  No problems since last visit.  [facility] dropped him off on the wrong side of the hospital, and he had no SOB walking across campus. Denies PND or Orthopnea. Weight at home stable around 220. Followed by Paramedicine. Lives with roommate (his Pastors son.) Requires assistance with transportation by [facility]. Never needs any extra lasix.  Watches his fluid and salt.   RHC 10/12/15 RA = 7 RV = 28/1/8 PA = 32/15 (20) PCW = 12 Fick cardiac output/index = 3.6/1.6 Thermo cardiac output/index = 3.5/1.5 PVR = 2.2 WU PA sat = 44%,  47% Ao sat = 98%  Echo 10/05/15 LVEF 30-35%, Mild MR, PA peak pressure 34 mmHg Echo 02/27/16 LVEF 55-60%, Grade 2 DD dysfunction. LAE mildly dilated, PA peak pressure 35 mm Hg.   Labs: 12/30/15 K 5.0, creatinine 1.3 Labs: 02/29/16 K 4.7, creatinine 1.27 Labs: 03/25/2016: K 4.3 Creatinine 1.31   Past Medical History  Diagnosis Date  . CAD (coronary artery disease)   . Chronic systolic heart failure ([place])   . Ischemic cardiomyopathy     EF 15 to 20% by TTE and TEE in 09/2012.  Severe LV dysfunction  . HTN (hypertension)   . Hyperlipidemia   . Obesity     BMI 31 in 09/2012  . Atrial fibrillation   09/22/2012  . implantable cardiac defibrillator-Biotronik     Device Implanted 2006; s/p gen change 03/2011 : bleeding persistent with pocket erosion and infection; explant and reimplant  06/2011  . Stroke ([place])   . 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 History of MRSA infection 05/2011  . Seizure disorder ([place]) latest 09/30/2012  . 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 Seizures ([place])     Current Outpatient Prescriptions  Medication Sig Dispense Refill  . amiodarone (PACERONE) 200 MG tablet Take 1 tablet (200 mg total) by mouth daily. 30 tablet 3  . amoxicillin-clavulanate (AUGMENTIN) 875-125 MG tablet Take 1 tablet by mouth 2 (two) times daily. 20 tablet 0  . carvedilol (COREG) 3.125 MG tablet Take 1 tablet (3.125 mg total) by  mouth 2 (two) times daily. 60 tablet 6  . furosemide (LASIX) 40 MG tablet Take 1 tablet (40 mg total) by mouth daily. 30 tablet 3  . hydrALAZINE (APRESOLINE) 25 MG tablet Take 3 tablets (75 mg total) by mouth 3 (three) times daily. 270 tablet 3  . isosorbide dinitrate (ISOCHRON) 40 MG CR tablet Take 1 tablet (40 mg total) by mouth 3 (three) times daily. 90 tablet 6  . losartan (COZAAR) 25 MG tablet Take 1 tablet (25 mg total) by mouth daily. 90 tablet 3  . magic mouthwash SOLN Swish, gargle, and spit one to two teaspoonfuls every six hours as needed 240 mL 1  .  phenytoin (DILANTIN) 100 MG ER capsule Take 1 capsule (100 mg total) by mouth 2 (two) times daily. 60 capsule 0  . simvastatin (ZOCOR) 20 MG tablet Take 1 tablet (20 mg total) by mouth daily at 6 PM. 90 tablet 3  . spironolactone (ALDACTONE) 25 MG tablet Take 12.5 mg by mouth daily.    [name] warfarin (COUMADIN) 4 MG tablet Take 1 tablet (4 mg total) by mouth daily. 30 tablet 1   No current facility-administered medications for this encounter.    No Known Allergies    Social History   Social History  . Marital Status: Single    Spouse Name: N/A  . Number of Children: N/A  . Years of Education: N/A   Occupational History  . Not on file.   Social History Main Topics  . Smoking status: Former Smoker    Types: Pipe    Quit date: 09/21/2008  . Smokeless tobacco: Former Systems developer    Quit date: 09/21/2008  . Alcohol Use: No  . Drug Use: No  . Sexual Activity: No   Other Topics Concern  . Not on file   Social History Narrative   ** Merged History Encounter **          Family History  Problem Relation Age of Onset  . Arthritis Mother   . Heart disease Mother   . Other Father     smoker  . Heart attack Mother   . Hypertension Neg Hx     unknown  . Stroke Neg Hx     unknown    Filed Vitals:   06/19/16 1349  BP: 144/100  Pulse: 84  Weight: 224 lb 9.6 oz (101.878 kg)  SpO2: 97%     Wt Readings from Last 3 Encounters:  06/19/16 224 lb 9.6 oz (101.878 kg)  05/18/16 218 lb (98.884 kg)  04/16/16 231 lb 4 oz (104.894 kg)     PHYSICAL EXAM: General:  WDWN, NAD HEENT: normal Neck: supple. JVD 6-7. Carotids 2+ bilat; no bruits. No thyromegaly or lymphadenopathy noted. Cor: PMI nondisplaced. Regular rhythm. No M/G/R. Lungs: CTAB, normal effort. Abdomen: soft, non-tender, non-distended, no HSM. No bruits or masses. +BS  Extremities: no cyanosis, clubbing, rash. Trace ankle edema  Neuro: alert & oriented x 3, cranial nerves grossly intact. moves all 4 extremities w/o  difficulty. Affect pleasant.  ASSESSMENT & PLAN:  1. Chronic Systolic CHF, Echo [id number] Echo improved to 55-60% from echo 10/05/15 LVEF 30-35%  - NYHA II. Volume status stable. - Continue lasix 40 mg daily.  BMET today. Can take extra 40 mg as needed for weight gain of 3 lbs overnight or 4 lbs within one week.  - Continue carvedilol 3.125 bid - Continue spiro 12.5 mg daily. Continue losartan 25 mg daily.  - Stop hydralazine and isochron. Start on  Bidil 2 tabs TID.  2. Paroxysmal Atrial Fibrillation  - Continue amiodarone 200 mg daily. On coumadin. No bleeding problems.   - INR followed at the coumadin clinic, Needs appointment, but says he can't go until after the July 4th holiday. Recent TSH ok. Needs yearly eye exams. .   3. CAD s/p CABG 4. HTN - Mildly elevated, switching to Bidil as above.  5. CKD stage III 6. Gout - On allopurinol 200 mg daily. Followed by PCP  7. Learning disability    BMET today. Switch to Bidil as above.   Follow up 3 months.  Knows to call with any change in symptoms to make sooner follow up.    Shirley Friar, PA-C  2:03 PM

## 2016-06-19 NOTE — Progress Notes (Addendum)
Advanced Heart Failure Medication Review by a Pharmacist  Does the patient  feel that his/her medications are working for him/her?  yes  Has the patient been experiencing any side effects to the medications prescribed?  no  Does the patient measure his/her own blood pressure or blood glucose at home?  Yes, paramedicine   Does the patient have any problems obtaining medications due to transportation or finances?   no  Understanding of regimen: good Understanding of indications: good Potential of compliance: good Patient understands to avoid NSAIDs. Patient understands to avoid decongestants.  Pharmacist comments: Mr. Louis Ford is a pleasant 10 yom presenting to clinic w. paramedicine for a follow-up appointment. He denies any current medication-related side effects. He also denies any swelling, SOB, or dizziness. Patient has a fairly good understanding of his medication regimen. He does not have any additional medication-related questions or concerns at this time.   Enrolled patient in [employer] for assistance with Bidil cost (will have $800 through 06/18/17 toward copay costs). Info relayed to [facility] on [address] who verified $0 copay.   Billing ID: [id number] Person Code: [id number] RX Group: [id number] RX BIN: [id number] PCN for Part D: [id number]    Meagan C. Lennox Grumbles, PharmD Pharmacy Resident  Pager: [phone] 06/19/2016 2:07 PM   Time with patient: 10 min   Preparation and documentation time: 2 min  Total time: 12 min

## 2016-06-19 NOTE — Patient Instructions (Addendum)
STOP Hydralazine and Imdur START Bidil, two tabs three times per day Labs today  Your physician recommends that you schedule a follow-up appointment in: 3 months with Dr Haroldine Laws  Do the following things EVERYDAY: 1) Weigh yourself in the morning before breakfast. Write it down and keep it in a log. 2) Take your medicines as prescribed 3) Eat low salt foods-Limit salt (sodium) to 2000 mg per day.  4) Stay as active as you can everyday 5) Limit all fluids for the day to less than 2 liters 6)

## 2016-06-26 ENCOUNTER — Other Ambulatory Visit: Payer: Self-pay | Admitting: Family

## 2016-06-27 NOTE — Telephone Encounter (Signed)
Please advise on the antibiotic refill from pharmacy.

## 2016-07-06 ENCOUNTER — Encounter: Payer: Self-pay | Admitting: Internal Medicine

## 2016-07-13 ENCOUNTER — Encounter: Payer: Medicare Other | Admitting: Internal Medicine

## 2016-07-19 ENCOUNTER — Other Ambulatory Visit: Payer: Self-pay | Admitting: Family

## 2016-07-24 ENCOUNTER — Ambulatory Visit (INDEPENDENT_AMBULATORY_CARE_PROVIDER_SITE_OTHER): Payer: Medicare Other | Admitting: Physician Assistant

## 2016-07-24 ENCOUNTER — Encounter: Payer: Self-pay | Admitting: Physician Assistant

## 2016-07-24 ENCOUNTER — Ambulatory Visit (INDEPENDENT_AMBULATORY_CARE_PROVIDER_SITE_OTHER): Payer: Medicare Other | Admitting: *Deleted

## 2016-07-24 VITALS — BP 114/70 | HR 75 | Ht 70.0 in | Wt 217.2 lb

## 2016-07-24 DIAGNOSIS — I255 Ischemic cardiomyopathy: Secondary | ICD-10-CM

## 2016-07-24 DIAGNOSIS — I480 Paroxysmal atrial fibrillation: Secondary | ICD-10-CM | POA: Diagnosis not present

## 2016-07-24 DIAGNOSIS — Z5181 Encounter for therapeutic drug level monitoring: Secondary | ICD-10-CM

## 2016-07-24 DIAGNOSIS — Z7901 Long term (current) use of anticoagulants: Secondary | ICD-10-CM | POA: Diagnosis not present

## 2016-07-24 DIAGNOSIS — I4891 Unspecified atrial fibrillation: Secondary | ICD-10-CM | POA: Diagnosis not present

## 2016-07-24 DIAGNOSIS — I5022 Chronic systolic (congestive) heart failure: Secondary | ICD-10-CM | POA: Diagnosis not present

## 2016-07-24 DIAGNOSIS — I10 Essential (primary) hypertension: Secondary | ICD-10-CM

## 2016-07-24 DIAGNOSIS — I2510 Atherosclerotic heart disease of native coronary artery without angina pectoris: Secondary | ICD-10-CM

## 2016-07-24 LAB — POCT INR: INR: 2

## 2016-07-24 MED ORDER — CARVEDILOL 3.125 MG PO TABS: 3.1250 mg | ORAL_TABLET | Freq: Two times a day (BID) | ORAL | 1 refills | Status: DC

## 2016-07-24 MED ORDER — FUROSEMIDE 40 MG PO TABS: 40.0000 mg | ORAL_TABLET | Freq: Every day | ORAL | 1 refills | Status: DC

## 2016-07-24 NOTE — Progress Notes (Signed)
Cardiology Office Note Date:  07/24/2016  Patient ID:  [facility] DOB January 15, 1946, MRN [id number] PCP:  [name], MD  Cardiologist:  Dr. Haroldine Laws Electrophysiologist: Dr. Lovena Le   Chief Complaint: routine EP/device visit  History of Present Illness: [facility]. is a 70 y.o. male with history of CAD s/p CABG, ICM s/p Biotronik ICD, chronic systolic CHF Echo 99991111 LVEF 30-35%; normal RV; PA peak pressure 34 mm Hg, HTN, HLD, atrial fibrillation, previous stroke, Factor 7 deficiency, and seizure disorder.  He has some degree of learning disability though appears to have good community and family/friends support.   He follows closely with CHF service, last visit there with Estrella Myrtle, PA was 06/19/16 and at that visit he mentioned that [facility] dropped him off on the wrong side of the hospital, and he had no SOB walking across campus. Denies PND or Orthopnea. Weight at home stable around 220. Followed by Paramedicine. Lives with roommate (his Pastors son.) Requires assistance with transportation by [facility]. Had not needed any extra lasix.  He was started on Bidil then.    He comes in today to be seen for Dr. Lovena Le for his routine annual in-clinic EP/device check.  He is feeling quite well, mentions that he is getting married soon.  He mentions a pin-prick type pain to his far lateral R chest randomly, otherwise no CP, no palpitations or SOB, he reports good exertional tolerance and no symptoms of orthopnea or PND.  He has not had shocks from his device, no near syncope or syncope.  Denies any bleeding or signs of bleeding   Past Medical History:  Diagnosis Date  . Atrial fibrillation   09/22/2012  . CAD (coronary artery disease)   . Chronic systolic heart failure ([place])   . 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 History of MRSA infection 05/2011  . HTN (hypertension)   . Hyperlipidemia   . implantable cardiac defibrillator-Biotronik      Device Implanted 2006; s/p gen change 03/2011 : bleeding persistent with pocket erosion and infection; explant and reimplant  06/2011  . Ischemic cardiomyopathy    EF 15 to 20% by TTE and TEE in 09/2012.  Severe LV dysfunction  . Obesity    BMI 31 in 09/2012  . Seizure disorder ([place]) latest 09/30/2012  . Seizures ([place])   . Stroke [facility])     Past Surgical History:  Procedure Laterality Date  . CARDIAC CATHETERIZATION N/A 10/12/2015   Procedure: Right Heart Cath;  Surgeon: [name], MD;  Location: [place] CV LAB;  Service: Cardiovascular;  Laterality: N/A;  . CARDIOVERSION  09/24/2012   Procedure: CARDIOVERSION;  Surgeon: [name], MD;  Location: [facility] ENDOSCOPY;  Service: Cardiovascular;  Laterality: N/A;  . CORONARY ARTERY BYPASS GRAFT    . ICD    . TEE WITHOUT CARDIOVERSION  09/24/2012   Procedure: TRANSESOPHAGEAL ECHOCARDIOGRAM (TEE);  Surgeon: [name], MD;  Location: [place];  Service: Cardiovascular;  Laterality: N/A;  dave/anesth, dl, cindy/echo     Current Outpatient Prescriptions  Medication Sig Dispense Refill  . amiodarone (PACERONE) 200 MG tablet Take 1 tablet (200 mg total) by mouth daily. 30 tablet 3  . amoxicillin-clavulanate (AUGMENTIN) 875-125 MG tablet Take 1 tablet by mouth 2 (two) times daily. 20 tablet 0  . carvedilol (COREG) 3.125 MG tablet Take 1 tablet (3.125 mg total) by mouth 2 (two) times daily. 180 tablet 1  . furosemide (LASIX)  40 MG tablet Take 1 tablet (40 mg total) by mouth daily. 90 tablet 1  . isosorbide-hydrALAZINE (BIDIL) 20-37.5 MG tablet Take 2 tablets by mouth 3 (three) times daily. 180 tablet 3  . losartan (COZAAR) 25 MG tablet Take 1 tablet (25 mg total) by mouth daily. 90 tablet 3  . magic mouthwash SOLN Swish, gargle, and spit one to two teaspoonfuls every six hours as needed 240 mL 1  . nystatin (MYCOSTATIN) 100000 UNIT/ML suspension SWISH GARGLE AND SPIT ONE TO TWO TEASPOONFUL EVERY 6 HOURS AS NEEDED 240 mL 0  .  phenytoin (DILANTIN) 100 MG ER capsule Take 1 capsule (100 mg total) by mouth 2 (two) times daily. 60 capsule 0  . simvastatin (ZOCOR) 20 MG tablet Take 1 tablet (20 mg total) by mouth daily at 6 PM. 90 tablet 3  . spironolactone (ALDACTONE) 25 MG tablet Take 12.5 mg by mouth daily.    [name] warfarin (COUMADIN) 4 MG tablet Take 1 tablet (4 mg total) by mouth daily. 30 tablet 1   No current facility-administered medications for this visit.     Allergies:   Review of patient's allergies indicates no known allergies.   Social History:  The patient  reports that he quit smoking about 7 years ago. His smoking use included Pipe. He quit smokeless tobacco use about 7 years ago. He reports that he does not drink alcohol or use drugs.   Family History:  The patient's family history includes Arthritis in his mother; Heart attack in his mother; Heart disease in his mother; Other in his father.  ROS:  Please see the history of present illness.    All other systems are reviewed and otherwise negative.   PHYSICAL EXAM:  VS:  BP 114/70   Pulse 75   Ht 5\' 10"  (1.778 m)   Wt 217 lb 3.2 oz (98.5 kg)   SpO2 99%   BMI 31.16 kg/m  BMI: Body mass index is 31.16 kg/m. Well nourished, well developed, in no acute distress  HEENT: normocephalic, atraumatic  Neck: no JVD, carotid bruits or masses Cardiac:  RRR; no significant murmurs, no rubs, or gallops Lungs:  clear to auscultation bilaterally, no wheezing, rhonchi or rales  Abd: soft, nontender MS: no deformity or atrophy Ext: trace if any edema  Skin: warm and dry, no rash Neuro:  No gross deficits appreciated Psych: euthymic mood, full affect  ICD site is stable, no tethering or discomfort   EKG:  Done on 03/25/16 with SR, APC/VPC PPM interrogation today notes normal function, thoraci impedance appears stable, no arrhythmias, battery status at [place] 10/12/15 RA = 7 RV = 28/1/8 PA = 32/15 (20) PCW = 12 Fick cardiac output/index =  3.6/1.6 Thermo cardiac output/index = 3.5/1.5 PVR = 2.2 WU PA sat = 44%, 47% Ao sat = 98%  Echo 10/05/15 LVEF 30-35%, Mild MR, PA peak pressure 34 mmHg Echo 02/27/16 LVEF 55-60%, Grade 2 DD dysfunction. LAE mildly dilated, PA peak pressure 35 mm Hg.   Recent Labs: 02/27/2016: ALT 16; Magnesium 1.8; TSH 4.689 03/08/2016: B Natriuretic Peptide 176.3 03/25/2016: Hemoglobin 15.5; Platelets 164 06/19/2016: BUN 16; Creatinine, Ser 1.20; Potassium 4.5; Sodium 136  02/27/2016: Cholesterol 228; HDL 50; LDL Cholesterol 162; Total CHOL/HDL Ratio 4.6; Triglycerides 79; VLDL 16   CrCl cannot be calculated (Patient's most recent lab result is older than the maximum 21 days allowed.).   Wt Readings from Last 3 Encounters:  07/24/16 217 lb 3.2 oz (98.5 kg)  06/19/16  224 lb 9.6 oz (101.9 kg)  05/18/16 218 lb (98.9 kg)     Other studies reviewed: Additional studies/records reviewed today include: summarized above  DEVICE information: Biotronik single chamber ICD, implanted 04/06/11, Dr. Lovena Le, primary prevention The patient denies ever being shocked  ASSESSMENT AND PLAN: 1. Chronic Systolic CHF, ICM w/ICD     Device functioning normally     Follows closely with CHF team     volume status appears stable, weight is down     On BB/ARB, diuretic tx  2. Paroxysmal Atrial Fibrillation      CHA2DS2Vasc is 5 on coumadin, coumadin clinic visit today, he is overdue    Continue amiodarone 200 mg daily.      3. CAD s/p CABG     no anginal CP     On BB, statin, no ASA with warfarin     C/w Dr. Haroldine Laws  4. HTN     stable   Disposition: F/u with 3 month remote device check and Dr. Lovena Le in 1 year, sooner if needed.  C/w CHF team as usual/instructed.  Current medicines are reviewed at length with the patient today.  The patient did not have any concerns regarding medicines.  Haywood Lasso, PA-C 07/24/2016 6:44 PM     [place] [phone] (office)  [phone] (fax)

## 2016-07-24 NOTE — Patient Instructions (Addendum)
Medication Instructions:   Your physician recommends that you continue on your current medications as directed. Please refer to the Current Medication list given to you today.  If you need a refill on your cardiac medications before your next appointment, please call your pharmacy.  Labwork:  NONE ORDER TODAY    Testing/Procedures: NONE ORDER TODAY    Follow-Up: Your physician wants you to follow-up in: [place] will receive a reminder letter in the mail two months in advance. If you don't receive a letter, please call our office to schedule the follow-up appointment.   Remote monitoring is used to monitor your Pacemaker of ICD from home. This monitoring reduces the number of office visits required to check your device to one time per year. It allows [place] to keep an eye on the functioning of your device to ensure it is working properly. You are scheduled for a device check from home on . 10/24/2016..You may send your transmission at any time that day. If you have a wireless device, the transmission will be sent automatically. After your physician reviews your transmission, you will receive a postcard with your next transmission date.    Any Other Special Instructions Will Be Listed Below (If Applicable).

## 2016-07-26 ENCOUNTER — Encounter: Payer: Self-pay | Admitting: Internal Medicine

## 2016-08-16 ENCOUNTER — Ambulatory Visit (INDEPENDENT_AMBULATORY_CARE_PROVIDER_SITE_OTHER): Payer: Medicare Other | Admitting: Internal Medicine

## 2016-08-16 ENCOUNTER — Telehealth: Payer: Self-pay | Admitting: Internal Medicine

## 2016-08-16 ENCOUNTER — Other Ambulatory Visit (INDEPENDENT_AMBULATORY_CARE_PROVIDER_SITE_OTHER): Payer: Medicare Other

## 2016-08-16 VITALS — BP 120/84 | HR 68 | Temp 98.4°F | Resp 16 | Wt 217.0 lb

## 2016-08-16 DIAGNOSIS — R569 Unspecified convulsions: Secondary | ICD-10-CM

## 2016-08-16 DIAGNOSIS — R0789 Other chest pain: Secondary | ICD-10-CM

## 2016-08-16 LAB — TROPONIN I: TNIDX: 0.01 ug/l (ref 0.00–0.06)

## 2016-08-16 MED ORDER — NYSTATIN 100000 UNIT/ML MT SUSP: OROMUCOSAL | 0 refills | Status: DC

## 2016-08-16 MED ORDER — TRIAMCINOLONE ACETONIDE 0.1 % EX CREA: 1.0000 "application " | TOPICAL_CREAM | Freq: Two times a day (BID) | CUTANEOUS | 0 refills | Status: DC

## 2016-08-16 NOTE — Progress Notes (Signed)
Pre visit review using our clinic review tool, if applicable. No additional management support is needed unless otherwise documented below in the visit note. 

## 2016-08-16 NOTE — Patient Instructions (Signed)
We have sent in the refills and the cream for the arm to your pharmacy.   We are checking the blood test today for the heart and will call back with the results.

## 2016-08-16 NOTE — Telephone Encounter (Signed)
Patient Name: JHOEL ARTHER DOB: Nov 18, 1946 Initial Comment Caller states he's having chest pain. Nurse Assessment Nurse: Roosvelt Maser, RN, Barnetta Chapel Date/Time (Eastern Time): 08/16/2016 10:46:14 AM Confirm and document reason for call. If symptomatic, describe symptoms. You must click the next button to save text entered. ---chest pain couple days off/on 3/10. no fever. no pain right now Has the patient traveled out of the country within the last 30 days? ---Not Applicable Does the patient have any new or worsening symptoms? ---Yes Will a triage be completed? ---Yes Related visit to physician within the last 2 weeks? ---N/A Does the PT have any chronic conditions? (i.e. diabetes, asthma, etc.) ---Yes List chronic conditions. ---chf Is this a behavioral health or substance abuse call? ---No Guidelines Guideline Title Affirmed Question Affirmed Notes Chest Pain [1] Chest pain lasting <= 5 minutes AND [2] NO chest pain or cardiac symptoms now (Exceptions: pains lasting a few seconds) Final Disposition User See Physician within [place], RN, [employer] Referrals REFERRED TO PCP OFFICE Disagree/Comply: Leta Baptist

## 2016-08-16 NOTE — Telephone Encounter (Signed)
Will address at acute visit today.

## 2016-08-16 NOTE — Telephone Encounter (Signed)
Patient is requesting refill on nystatin to be sent to [facility] on [address].

## 2016-08-17 ENCOUNTER — Encounter: Payer: Self-pay | Admitting: Internal Medicine

## 2016-08-17 DIAGNOSIS — G8929 Other chronic pain: Secondary | ICD-10-CM | POA: Insufficient documentation

## 2016-08-17 DIAGNOSIS — R0789 Other chest pain: Secondary | ICD-10-CM

## 2016-08-17 NOTE — Progress Notes (Signed)
   Subjective:    Patient ID: Louis Dandy., male    DOB: 1946-05-10, 70 y.o.   MRN: [id number]  HPI The patient is a 70 YO man coming in for side pain. He had gotten married several weeks ago and denies overuse or injury. No chest pains, SOB. He has history of heart problems and so he came in to get checked. Denies significant cough or fevers or chills. It is not worse with deep breath. He is still taking his coumadin and last INR 2.0. No long travel. Some pain with touching it and with bending.   Review of Systems  Constitutional: Negative for activity change, appetite change, chills, fatigue, fever and unexpected weight change.  HENT: Negative.   Respiratory: Negative for cough, chest tightness, shortness of breath and wheezing.   Cardiovascular: Positive for chest pain. Negative for palpitations and leg swelling.       Flank  Gastrointestinal: Negative for abdominal distention, abdominal pain, constipation, diarrhea and nausea.  Musculoskeletal: Negative for arthralgias, back pain, gait problem and myalgias.  Skin: Negative.   Neurological: Negative for dizziness, weakness, light-headedness, numbness and headaches.       Still working with PT  Psychiatric/Behavioral: Negative.       Objective:   Physical Exam  Constitutional: He is oriented to person, place, and time. He appears well-developed and well-nourished.  HENT:  Head: Normocephalic and atraumatic.  Eyes: EOM are normal.  Neck: Normal range of motion.  Cardiovascular: Normal rate and regular rhythm.   Pulmonary/Chest: Effort normal and breath sounds normal. No respiratory distress. He has no wheezes. He has no rales. He exhibits tenderness.  Some pain at the bottom of the right rib cage and none on the left right now.   Abdominal: Soft. Bowel sounds are normal. He exhibits no distension. There is no tenderness. There is no rebound.  Musculoskeletal: He exhibits no edema.  Neurological: He is alert and oriented to  person, place, and time.  Skin: Skin is warm and dry.   Vitals:   08/16/16 1602  BP: 120/84  Pulse: 68  Resp: 16  Temp: 98.4 F (36.9 C)  TempSrc: Oral  SpO2: 98%  Weight: 217 lb (98.4 kg)      Assessment & Plan:

## 2016-08-17 NOTE — Assessment & Plan Note (Signed)
No defib shocks, no pain with exertion, no pain with breathing. Likely to be musculoskeletal pain. He is taking tylenol and will continue. Checking troponin today due to his strong history of heart problems.

## 2016-08-18 LAB — PHENYTOIN LEVEL, FREE: PHENYTOIN FREE: 0.6 ug/mL — AB (ref 1.0–2.0)

## 2016-08-22 ENCOUNTER — Ambulatory Visit (INDEPENDENT_AMBULATORY_CARE_PROVIDER_SITE_OTHER): Payer: Medicare Other | Admitting: *Deleted

## 2016-08-22 DIAGNOSIS — Z5181 Encounter for therapeutic drug level monitoring: Secondary | ICD-10-CM

## 2016-08-22 DIAGNOSIS — I4891 Unspecified atrial fibrillation: Secondary | ICD-10-CM

## 2016-08-22 DIAGNOSIS — Z7901 Long term (current) use of anticoagulants: Secondary | ICD-10-CM

## 2016-08-22 LAB — POCT INR: INR: 1.6

## 2016-08-22 NOTE — Patient Instructions (Signed)
Wrote medication schedule down on card for patient Reinforced medication compliance and risk of stroke

## 2016-08-24 ENCOUNTER — Telehealth: Payer: Self-pay | Admitting: Emergency Medicine

## 2016-08-24 ENCOUNTER — Other Ambulatory Visit: Payer: Self-pay | Admitting: *Deleted

## 2016-08-24 ENCOUNTER — Other Ambulatory Visit: Payer: Self-pay | Admitting: Internal Medicine

## 2016-08-24 ENCOUNTER — Other Ambulatory Visit: Payer: Self-pay | Admitting: Geriatric Medicine

## 2016-08-24 MED ORDER — PHENYTOIN SODIUM EXTENDED 100 MG PO CAPS: 100.0000 mg | ORAL_CAPSULE | Freq: Two times a day (BID) | ORAL | 3 refills | Status: DC

## 2016-08-24 MED ORDER — AMIODARONE HCL 200 MG PO TABS: 200.0000 mg | ORAL_TABLET | Freq: Every day | ORAL | 3 refills | Status: DC

## 2016-08-24 MED ORDER — WARFARIN SODIUM 4 MG PO TABS: 4.0000 mg | ORAL_TABLET | Freq: Every day | ORAL | 1 refills | Status: DC

## 2016-08-24 MED ORDER — PHENYTOIN SODIUM EXTENDED 100 MG PO CAPS: 100.0000 mg | ORAL_CAPSULE | Freq: Two times a day (BID) | ORAL | 0 refills | Status: DC

## 2016-08-24 NOTE — Telephone Encounter (Signed)
Sent to pharmacy 

## 2016-08-24 NOTE — Telephone Encounter (Signed)
Pt called and needs prescription refills on phenytoin (DILANTIN) 100 MG ER capsule and  amiodarone (PACERONE) 200 MG tablet. The pharmacy is [facility]. Please advise thanks.

## 2016-09-05 ENCOUNTER — Ambulatory Visit (INDEPENDENT_AMBULATORY_CARE_PROVIDER_SITE_OTHER): Payer: Medicare Other | Admitting: *Deleted

## 2016-09-05 ENCOUNTER — Other Ambulatory Visit: Payer: Self-pay | Admitting: Internal Medicine

## 2016-09-05 DIAGNOSIS — I4891 Unspecified atrial fibrillation: Secondary | ICD-10-CM | POA: Diagnosis not present

## 2016-09-05 DIAGNOSIS — Z5181 Encounter for therapeutic drug level monitoring: Secondary | ICD-10-CM

## 2016-09-05 DIAGNOSIS — Z7901 Long term (current) use of anticoagulants: Secondary | ICD-10-CM

## 2016-09-05 LAB — POCT INR: INR: 1.7

## 2016-09-11 ENCOUNTER — Other Ambulatory Visit (HOSPITAL_COMMUNITY): Payer: Self-pay | Admitting: Internal Medicine

## 2016-09-11 DIAGNOSIS — I5023 Acute on chronic systolic (congestive) heart failure: Secondary | ICD-10-CM

## 2016-09-20 ENCOUNTER — Encounter (HOSPITAL_COMMUNITY): Payer: Self-pay | Admitting: Internal Medicine

## 2016-09-20 ENCOUNTER — Ambulatory Visit (HOSPITAL_COMMUNITY)
Admission: RE | Admit: 2016-09-20 | Discharge: 2016-09-20 | Disposition: A | Discharge status: Home / Self Care | Payer: Medicare Other | Source: Ambulatory Visit | Attending: Internal Medicine | Admitting: Internal Medicine

## 2016-09-20 ENCOUNTER — Ambulatory Visit (INDEPENDENT_AMBULATORY_CARE_PROVIDER_SITE_OTHER): Payer: Medicare Other | Admitting: *Deleted

## 2016-09-20 VITALS — BP 138/84 | HR 72 | Wt 208.0 lb

## 2016-09-20 DIAGNOSIS — I4891 Unspecified atrial fibrillation: Secondary | ICD-10-CM | POA: Diagnosis not present

## 2016-09-20 DIAGNOSIS — E669 Obesity, unspecified: Secondary | ICD-10-CM | POA: Insufficient documentation

## 2016-09-20 DIAGNOSIS — I130 Hypertensive heart and chronic kidney disease with heart failure and stage 1 through stage 4 chronic kidney disease, or unspecified chronic kidney disease: Secondary | ICD-10-CM | POA: Insufficient documentation

## 2016-09-20 DIAGNOSIS — I2583 Coronary atherosclerosis due to lipid rich plaque: Secondary | ICD-10-CM

## 2016-09-20 DIAGNOSIS — G40909 Epilepsy, unspecified, not intractable, without status epilepticus: Secondary | ICD-10-CM | POA: Insufficient documentation

## 2016-09-20 DIAGNOSIS — Z7901 Long term (current) use of anticoagulants: Secondary | ICD-10-CM

## 2016-09-20 DIAGNOSIS — D682 Hereditary deficiency of other clotting factors: Secondary | ICD-10-CM | POA: Diagnosis not present

## 2016-09-20 DIAGNOSIS — M109 Gout, unspecified: Secondary | ICD-10-CM | POA: Insufficient documentation

## 2016-09-20 DIAGNOSIS — I10 Essential (primary) hypertension: Secondary | ICD-10-CM

## 2016-09-20 DIAGNOSIS — F819 Developmental disorder of scholastic skills, unspecified: Secondary | ICD-10-CM | POA: Diagnosis not present

## 2016-09-20 DIAGNOSIS — Z95810 Presence of automatic (implantable) cardiac defibrillator: Secondary | ICD-10-CM | POA: Diagnosis not present

## 2016-09-20 DIAGNOSIS — I2510 Atherosclerotic heart disease of native coronary artery without angina pectoris: Secondary | ICD-10-CM | POA: Insufficient documentation

## 2016-09-20 DIAGNOSIS — I480 Paroxysmal atrial fibrillation: Secondary | ICD-10-CM | POA: Insufficient documentation

## 2016-09-20 DIAGNOSIS — Z5181 Encounter for therapeutic drug level monitoring: Secondary | ICD-10-CM

## 2016-09-20 DIAGNOSIS — Z8673 Personal history of transient ischemic attack (TIA), and cerebral infarction without residual deficits: Secondary | ICD-10-CM | POA: Insufficient documentation

## 2016-09-20 DIAGNOSIS — Z951 Presence of aortocoronary bypass graft: Secondary | ICD-10-CM | POA: Diagnosis not present

## 2016-09-20 DIAGNOSIS — E785 Hyperlipidemia, unspecified: Secondary | ICD-10-CM | POA: Insufficient documentation

## 2016-09-20 DIAGNOSIS — I5022 Chronic systolic (congestive) heart failure: Secondary | ICD-10-CM | POA: Diagnosis not present

## 2016-09-20 DIAGNOSIS — Z87891 Personal history of nicotine dependence: Secondary | ICD-10-CM | POA: Insufficient documentation

## 2016-09-20 DIAGNOSIS — N183 Chronic kidney disease, stage 3 (moderate): Secondary | ICD-10-CM | POA: Diagnosis not present

## 2016-09-20 DIAGNOSIS — I255 Ischemic cardiomyopathy: Secondary | ICD-10-CM | POA: Insufficient documentation

## 2016-09-20 LAB — POCT INR: INR: 1.7

## 2016-09-20 NOTE — Patient Instructions (Signed)
Provided patient with coumadin clinic card with day by day instructions on coumadin dosing, from today until his f/u date here. Reinforce medication management and teaching.

## 2016-09-20 NOTE — Progress Notes (Signed)
Patient ID: [facility]., male   DOB: Feb 05, 1946, 70 y.o.   MRN: [id number]    Advanced Heart Failure Clinic Note   Primary Care: Dr Pricilla Holm Primary Cardiologist: Dr Lovena Le Primary HF: Dr. Haroldine Laws  HPI: [facility]. is a 70 y.o. male with hx of CAD s/p CABG, ICM s/p Biotronik ICD, chronic systolic CHF Echo 99991111 LVEF 30-35% -> improved to 55-60% (3/17),HTN, HLD, atrial fibrillation, previous stroke, Factor 7 deficiency, and seizure disorder.  Admitted from [facility] in 10/16 with ADHF in setting of AF with RVR. He diuresed well initially on IV lasix 40 mg BID, but continued to have SOB and was thus on milrinoneon 10/14 for concerns of low output. RHC on 10/12/15 that showed depressed cardiac output with Fick CO/CI 3.6/1.6. Improved on milrinone and remained stable with wean. Bidil started and increased to goal dose.Also started on amio for afib rate control. D/c weight 229 lbs  Admitted 02/26/16 after two near syncopal episodes. ICD interrogation negative for any acute events. Suspect related to overdiuresis and hypotension with recurrent Afib.  Spontaneously converted to NSR without intervention. Echo EF 55-60%  He returns today for regular follow up.  Weight down 16 lbs since last visit. Married since last visit.  Watching fluid and diet better. Weight at home 203 lbs.  Can walk to mailbox and back. Wife helps with medicine and transport. Feeling good overall.  Paramedicine still following but stretched out to a month.     INR 1.7 today. Taking extra coumadin this week with follow up next week. No Orthopnea/PND/Bendopnea. No CP, did have some RUQ pain the other day, self-limiting.   RHC 10/12/15 RA = 7 RV = 28/1/8 PA = 32/15 (20) PCW = 12 Fick cardiac output/index = 3.6/1.6 Thermo cardiac output/index = 3.5/1.5 PVR = 2.2 WU PA sat = 44%, 47% Ao sat = 98%  Echo 10/05/15 LVEF 30-35%, Mild MR, PA peak pressure 34 mmHg Echo 02/27/16 LVEF 55-60%, Grade 2 DD  dysfunction. LAE mildly dilated, PA peak pressure 35 mm Hg.   Labs: 12/30/15 K 5.0, creatinine 1.3 Labs: 02/29/16 K 4.7, creatinine 1.27 Labs: 03/25/2016: K 4.3 Creatinine 1.31   Past Medical History:  Diagnosis Date  . Atrial fibrillation   09/22/2012  . CAD (coronary artery disease)   . Chronic systolic heart failure ([place])   . 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 History of MRSA infection 05/2011  . HTN (hypertension)   . Hyperlipidemia   . implantable cardiac defibrillator-Biotronik    Device Implanted 2006; s/p gen change 03/2011 : bleeding persistent with pocket erosion and infection; explant and reimplant  06/2011  . Ischemic cardiomyopathy    EF 15 to 20% by TTE and TEE in 09/2012.  Severe LV dysfunction  . Obesity    BMI 31 in 09/2012  . Seizure disorder ([place]) latest 09/30/2012  . Seizures ([place])   . Stroke [facility])     Current Outpatient Prescriptions  Medication Sig Dispense Refill  . amiodarone (PACERONE) 200 MG tablet Take 1 tablet (200 mg total) by mouth daily. 30 tablet 3  . carvedilol (COREG) 3.125 MG tablet Take 1 tablet (3.125 mg total) by mouth 2 (two) times daily. 180 tablet 1  . furosemide (LASIX) 40 MG tablet Take 1 tablet (40 mg total) by mouth daily. 90 tablet 1  . isosorbide-hydrALAZINE (BIDIL) 20-37.5 MG tablet Take 2 tablets by mouth 3 (three) times daily. 180 tablet 3  .  losartan (COZAAR) 25 MG tablet Take 1 tablet (25 mg total) by mouth daily. 90 tablet 3  . MULTIPLE VITAMIN PO Take by mouth daily.    [name] nystatin (MYCOSTATIN) 100000 UNIT/ML suspension SWISH, GARGLE AND SPIT 5-10 MLS BY MOUTH EVERY 6 HOURS AS NEEDED 240 mL 0  . phenytoin (DILANTIN) 100 MG ER capsule Take 1 capsule (100 mg total) by mouth 2 (two) times daily. 180 capsule 3  . simvastatin (ZOCOR) 20 MG tablet Take 1 tablet (20 mg total) by mouth daily at 6 PM. 90 tablet 3  . spironolactone (ALDACTONE) 25 MG tablet Take 0.5 tablets (12.5 mg total) by mouth  daily. 15 tablet 6  . triamcinolone cream (KENALOG) 0.1 % Apply 1 application topically 2 (two) times daily. 30 g 0  . warfarin (COUMADIN) 4 MG tablet Take 1 tablet (4 mg total) by mouth daily. 30 tablet 1   No current facility-administered medications for this encounter.     No Known Allergies    Social History   Social History  . Marital status: Single    Spouse name: N/A  . Number of children: N/A  . Years of education: N/A   Occupational History  . Not on file.   Social History Main Topics  . Smoking status: Former Smoker    Types: Pipe    Quit date: 09/21/2008  . Smokeless tobacco: Former Systems developer    Quit date: 09/21/2008  . Alcohol use No  . Drug use: No  . Sexual activity: No   Other Topics Concern  . Not on file   Social History Narrative   ** Merged History Encounter **          Family History  Problem Relation Age of Onset  . Arthritis Mother   . Heart disease Mother   . Heart attack Mother   . Other Father     smoker  . Hypertension Neg Hx     unknown  . Stroke Neg Hx     unknown    Vitals:   09/20/16 1352  BP: 138/84  Pulse: 72  SpO2: 97%  Weight: 208 lb (94.3 kg)     Wt Readings from Last 3 Encounters:  09/20/16 208 lb (94.3 kg)  08/16/16 217 lb (98.4 kg)  07/24/16 217 lb 3.2 oz (98.5 kg)     PHYSICAL EXAM: General:  WDWN, NAD HEENT: normal Neck: supple. JVD 6-7 Carotids 2+ bilat; no bruits. No thyromegaly or nodule noted.  Cor: PMI nondisplaced. RRR. No M/G/R Lungs: Clear, normal effort Abdomen: soft, NT, ND, no HSM. No bruits or masses. +BS  Extremities: no cyanosis, clubbing, rash. Trace ankle edema  Neuro: alert & oriented x 3, cranial nerves grossly intact. moves all 4 extremities w/o difficulty. Affect pleasant.  ASSESSMENT & PLAN:  1. Chronic Systolic CHF, Echo [id number] Echo improved to 55-60% from echo 10/05/15 LVEF 30-35%  - NYHA class II.  Volume status stable on exam.  - Continue lasix 40 mg daily.  Hasn't needed any  extra lasix.  Knows he can take extra 40 mg as needed for weight gain of 3 lbs overnight or 4 lbs within one week.  - Continue carvedilol 3.125 bid - Continue spiro 12.5 mg daily.  - Continue losartan 25 mg daily.  - Continue Bidil 2 tabs TID.  2. Paroxysmal Atrial Fibrillation  -Remains in NSR - Continue amiodarone 200 mg daily. On coumadin. No bleeding problems.   - Coumadin clinic following and adjusting doses.  3. CAD  s/p CABG 4. HTN - Mildly elevated, but has not had middle dose of Bidil today with traveling around for doctors visits.  5. CKD stage III 6. Gout - On allopurinol 200 mg daily. Followed by PCP  7. Learning disability   He is doing great.  Will graduate him from Paramedicine program. Follow up 3 months. Knows to call with any change in symptoms to make sooner follow up.    Shirley Friar, PA-C  2:29 PM   Patient seen and examined with Oda Kilts, PA-C. We discussed all aspects of the encounter. I agree with the assessment and plan as stated above.   EF has recovered. Doing very well. Volume status looks good. Maintaining NSR. Continue current regimen. Can graduate from [place], Daniel,MD 3:22 PM

## 2016-09-25 ENCOUNTER — Telehealth (HOSPITAL_COMMUNITY): Payer: Self-pay | Admitting: Surgery

## 2016-09-25 NOTE — Telephone Encounter (Signed)
Mr [facility] has been discharged from the [facility] [employer] [facility].  He has graduated the program and is now married.  His wife is now assisting him with home care needs.

## 2016-09-29 ENCOUNTER — Other Ambulatory Visit: Payer: Self-pay | Admitting: Internal Medicine

## 2016-10-02 ENCOUNTER — Encounter: Payer: Self-pay | Admitting: Internal Medicine

## 2016-10-04 ENCOUNTER — Ambulatory Visit (INDEPENDENT_AMBULATORY_CARE_PROVIDER_SITE_OTHER): Payer: Medicare Other | Admitting: *Deleted

## 2016-10-04 DIAGNOSIS — Z5181 Encounter for therapeutic drug level monitoring: Secondary | ICD-10-CM | POA: Diagnosis not present

## 2016-10-04 DIAGNOSIS — Z7901 Long term (current) use of anticoagulants: Secondary | ICD-10-CM

## 2016-10-04 DIAGNOSIS — I4891 Unspecified atrial fibrillation: Secondary | ICD-10-CM | POA: Diagnosis not present

## 2016-10-04 LAB — POCT INR: INR: 2.3

## 2016-10-09 ENCOUNTER — Telehealth: Payer: Self-pay | Admitting: Pharmacist

## 2016-10-09 NOTE — Telephone Encounter (Signed)
Received a fax from [name] that patient will be having 14 simple extractions and 4 surgical extractions. He takes Coumadin for afib with CHADS2 score of 4(HTN, FH, CVA) and factor VII deficiency. Patient will require Lovenox bridging prior to procedure. Will coordinate in Coumadin clinic.   Dr Lovena Le has cleared patient from a cardiac standpoint, pt does not need antibiotic prophylaxis.   Will fax clearance to Dr Allyson Sabal [phone].

## 2016-10-19 ENCOUNTER — Telehealth: Payer: Self-pay | Admitting: Internal Medicine

## 2016-10-19 NOTE — Telephone Encounter (Signed)
LMOVM requesting call back.  [facility] phone number.  Biotronik home monitor is automatic, monitor up to date.

## 2016-10-19 NOTE — Telephone Encounter (Signed)
°  New Prob   Requesting some assistance with transmitting. Please call.

## 2016-10-24 ENCOUNTER — Ambulatory Visit (INDEPENDENT_AMBULATORY_CARE_PROVIDER_SITE_OTHER): Payer: Medicare Other | Admitting: *Deleted

## 2016-10-24 DIAGNOSIS — I255 Ischemic cardiomyopathy: Secondary | ICD-10-CM | POA: Diagnosis not present

## 2016-10-24 NOTE — Progress Notes (Signed)
Remote ICD transmission.   

## 2016-10-25 ENCOUNTER — Other Ambulatory Visit: Payer: Self-pay | Admitting: Physician Assistant

## 2016-10-25 ENCOUNTER — Emergency Department (HOSPITAL_COMMUNITY): Payer: Medicare Other

## 2016-10-25 ENCOUNTER — Encounter (HOSPITAL_COMMUNITY): Payer: Self-pay | Admitting: *Deleted

## 2016-10-25 ENCOUNTER — Emergency Department (HOSPITAL_COMMUNITY)
Admission: EM | Admit: 2016-10-25 | Discharge: 2016-10-25 | Disposition: A | Discharge status: Home / Self Care | Payer: Medicare Other | Attending: Emergency Medicine | Admitting: Emergency Medicine

## 2016-10-25 DIAGNOSIS — Z951 Presence of aortocoronary bypass graft: Secondary | ICD-10-CM | POA: Insufficient documentation

## 2016-10-25 DIAGNOSIS — Z8673 Personal history of transient ischemic attack (TIA), and cerebral infarction without residual deficits: Secondary | ICD-10-CM | POA: Insufficient documentation

## 2016-10-25 DIAGNOSIS — I5022 Chronic systolic (congestive) heart failure: Secondary | ICD-10-CM | POA: Diagnosis not present

## 2016-10-25 DIAGNOSIS — Z79899 Other long term (current) drug therapy: Secondary | ICD-10-CM | POA: Diagnosis not present

## 2016-10-25 DIAGNOSIS — Z87891 Personal history of nicotine dependence: Secondary | ICD-10-CM | POA: Insufficient documentation

## 2016-10-25 DIAGNOSIS — R0789 Other chest pain: Secondary | ICD-10-CM | POA: Insufficient documentation

## 2016-10-25 DIAGNOSIS — I119 Hypertensive heart disease without heart failure: Secondary | ICD-10-CM | POA: Diagnosis not present

## 2016-10-25 DIAGNOSIS — R079 Chest pain, unspecified: Secondary | ICD-10-CM

## 2016-10-25 DIAGNOSIS — I2510 Atherosclerotic heart disease of native coronary artery without angina pectoris: Secondary | ICD-10-CM | POA: Diagnosis not present

## 2016-10-25 DIAGNOSIS — Z7901 Long term (current) use of anticoagulants: Secondary | ICD-10-CM | POA: Diagnosis not present

## 2016-10-25 DIAGNOSIS — I110 Hypertensive heart disease with heart failure: Secondary | ICD-10-CM | POA: Insufficient documentation

## 2016-10-25 DIAGNOSIS — R61 Generalized hyperhidrosis: Secondary | ICD-10-CM | POA: Diagnosis not present

## 2016-10-25 DIAGNOSIS — Z95810 Presence of automatic (implantable) cardiac defibrillator: Secondary | ICD-10-CM | POA: Diagnosis not present

## 2016-10-25 LAB — CBC
HCT: 49.7 % (ref 39.0–52.0)
HEMOGLOBIN: 16.4 g/dL (ref 13.0–17.0)
MCH: 29.8 pg (ref 26.0–34.0)
MCHC: 33 g/dL (ref 30.0–36.0)
MCV: 90.4 fL (ref 78.0–100.0)
Platelets: 138 10*3/uL — ABNORMAL LOW (ref 150–400)
RBC: 5.5 MIL/uL (ref 4.22–5.81)
RDW: 14.6 % (ref 11.5–15.5)
WBC: 4.4 10*3/uL (ref 4.0–10.5)

## 2016-10-25 LAB — COMPREHENSIVE METABOLIC PANEL
ALBUMIN: 3.7 g/dL (ref 3.5–5.0)
ALT: 19 U/L (ref 17–63)
AST: 27 U/L (ref 15–41)
Alkaline Phosphatase: 97 U/L (ref 38–126)
Anion gap: 9 (ref 5–15)
BUN: 21 mg/dL — AB (ref 6–20)
CALCIUM: 8.9 mg/dL (ref 8.9–10.3)
CHLORIDE: 103 mmol/L (ref 101–111)
CO2: 25 mmol/L (ref 22–32)
Creatinine, Ser: 1.19 mg/dL (ref 0.61–1.24)
GFR calc Af Amer: >60 mL/min (ref >60)
GFR calc non Af Amer: >60 mL/min (ref >60)
Glucose, Bld: 80 mg/dL (ref 65–99)
Potassium: 4.7 mmol/L (ref 3.5–5.1)
SODIUM: 137 mmol/L (ref 135–145)
TOTAL PROTEIN: 7.6 g/dL (ref 6.5–8.1)
Total Bilirubin: 0.9 mg/dL (ref 0.3–1.2)

## 2016-10-25 LAB — I-STAT TROPONIN, ED
Troponin i, poc: 0 ng/mL (ref 0.00–0.08)
Troponin i, poc: 0 ng/mL (ref 0.00–0.08)

## 2016-10-25 LAB — PROTIME-INR
INR: 1.79
Prothrombin Time: 21 seconds — ABNORMAL HIGH (ref 11.4–15.2)

## 2016-10-25 LAB — BRAIN NATRIURETIC PEPTIDE: B NATRIURETIC PEPTIDE 5: 56.7 pg/mL (ref 0.0–100.0)

## 2016-10-25 NOTE — ED Notes (Signed)
Two failed attempts by this RN to start an IV.

## 2016-10-25 NOTE — Consult Note (Signed)
CARDIOLOGY CONSULT NOTE   Patient ID: [facility]. MRN: [id number] DOB/AGE: 1946/06/14 70 y.o.  Admit date: 10/25/2016  Primary Physician   [name], MD Primary Cardiologist   Dr. Haroldine Laws EP: Dr. Lovena Le Reason for Consultation   Chest pain  Requesting Physician  Dr. [id number]  HPI: Louis Ford. is a 70 y.o. male with a history of CAD s/p CABG, ICM s/p Biotronik ICD, chronic systolic CHF Echo [id number] LVEF 30-35% -> improved to 55-60% (3/17),HTN, HLD, atrial fibrillation on coumadin and amiodarone, previous stroke, Factor 7 deficiency, and seizure disorder who presented for evaluation of chest pain.   Last Myoview 03/2014 read as perfusion defect in inferior lateral wall and cannot excluded ischemia. Apical scar. Reviewed with / by Dr. Sallyanne Kuster and deemed low risk study. This happed in setting of elevated troponin due to ICD shock.   Admitted from [facility] in 10/16 with ADHF in setting of AF with RVR. He diuresed well initially on IV lasix 40 mg BID, but continued to have SOB and was thus on milrinoneon 10/14 for concerns of low output. RHC on 10/12/15 that showed depressed cardiac output with Fick CO/CI 3.6/1.6. Improved on milrinone and remained stable with wean. Bidil started and increased to goal dose.Also started on amio for afib rate control.  Admitted 02/26/16 after two near syncopal episodes. ICD interrogation negative for any acute events. Suspect related to overdiuresis and hypotension with recurrent Afib.  Spontaneously converted to NSR without intervention. Echo EF 55-60%  He was doing well on cardiac stand point when last seen by Dr. Haroldine Laws 09/12/16.  Patient was in usual state of health up until last night. He woke up around 2 AM to use bathroom. He comes back and had a "left upper chest pain "which he described as achy sensation. Lasted for about an hour and resolved with prayer. He went to sleep. He again woke up at 7 AM and went to bathroom. However  developed a diaphoresis with lightheadedness that leading to ER presentation. He states that he "never has any chest pressure ". The patient denies any associated shortness of breath, nausea, vomiting or radiation of pain. No LE edema, abdominal tightness, palpitations, ICD shock, melena or blood in his stool or urine.   The patient scheduled to have 14 simple extractions and 4 surgical extractions in near future. Completed antibiotic take for this. Coumadin clinic is coordinating for bridging with Lovenox prior  procedure.  BNP 56. Point of care troponin negative. INR of 1.79. Electrolytes and kidney function normal. His x-ray without acute cardiopulmonary disease. EKG shows sinus rhythm at rate of 70 bpm and PVC.  Past Medical History:  Diagnosis Date  . Atrial fibrillation   09/22/2012  . CAD (coronary artery disease)   . Chronic systolic heart failure ([place])   . 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 History of MRSA infection 05/2011  . HTN (hypertension)   . Hyperlipidemia   . implantable cardiac defibrillator-Biotronik    Device Implanted 2006; s/p gen change 03/2011 : bleeding persistent with pocket erosion and infection; explant and reimplant  06/2011  . Ischemic cardiomyopathy    EF 15 to 20% by TTE and TEE in 09/2012.  Severe LV dysfunction  . Obesity    BMI 31 in 09/2012  . Seizure disorder ([place]) latest 09/30/2012  . Seizures ([place])   . Stroke [facility])      Past Surgical History:  Procedure Laterality Date  .  CARDIAC CATHETERIZATION N/A 10/12/2015   Procedure: Right Heart Cath;  Surgeon: [name], MD;  Location: [facility] CV LAB;  Service: Cardiovascular;  Laterality: N/A;  . CARDIOVERSION  09/24/2012   Procedure: CARDIOVERSION;  Surgeon: [name], MD;  Location: [facility] ENDOSCOPY;  Service: Cardiovascular;  Laterality: N/A;  . CORONARY ARTERY BYPASS GRAFT    . ICD    . TEE WITHOUT CARDIOVERSION  09/24/2012   Procedure:  TRANSESOPHAGEAL ECHOCARDIOGRAM (TEE);  Surgeon: [name], MD;  Location: [facility] ENDOSCOPY;  Service: Cardiovascular;  Laterality: N/A;  dave/anesth, dl, cindy/echo     No Known Allergies  I have reviewed the patient's current medications  Prior to Admission medications   Medication Sig Start Date End Date Taking? Authorizing Provider  acetaminophen (TYLENOL) 325 MG tablet Take 650 mg by mouth every 6 (six) hours as needed for mild pain.   Yes Historical Provider, MD  amiodarone (PACERONE) 200 MG tablet Take 1 tablet (200 mg total) by mouth daily. 08/24/16  Yes [name], MD  Ascorbic Acid (VITAMIN C) 100 MG tablet Take 100 mg by mouth daily.   Yes Historical Provider, MD  carvedilol (COREG) 3.125 MG tablet Take 1 tablet (3.125 mg total) by mouth 2 (two) times daily. 07/24/16  Yes [name], MD  furosemide (LASIX) 40 MG tablet Take 1 tablet (40 mg total) by mouth daily. 07/24/16  Yes [name], MD  isosorbide-hydrALAZINE (BIDIL) 20-37.5 MG tablet Take 2 tablets by mouth 3 (three) times daily. 06/19/16  Yes Shirley Friar, PA-C  losartan (COZAAR) 25 MG tablet Take 1 tablet (25 mg total) by mouth daily. 03/13/16  Yes Shirley Friar, PA-C  MULTIPLE VITAMIN PO Take by mouth daily.   Yes Historical Provider, MD  nystatin (MYCOSTATIN) 100000 UNIT/ML suspension SWISH, GARGLE AND SPIT 5 TO 10 ML BY MOUTH EVERY 6 HOURS AS NEEDED 10/01/16  Yes [name], MD  phenytoin (DILANTIN) 100 MG ER capsule Take 1 capsule (100 mg total) by mouth 2 (two) times daily. 08/24/16  Yes [name], MD  simvastatin (ZOCOR) 20 MG tablet Take 1 tablet (20 mg total) by mouth daily at 6 PM. 04/02/16  Yes [name], MD  spironolactone (ALDACTONE) 25 MG tablet Take 0.5 tablets (12.5 mg total) by mouth daily. 09/11/16  Yes [name] [name], MD  triamcinolone cream (KENALOG) 0.1 % Apply 1 application topically 2 (two) times daily. 08/16/16  Yes [name], MD    warfarin (COUMADIN) 4 MG tablet Take 1 tablet (4 mg total) by mouth daily. 08/24/16  Yes [name], MD     Social History   Social History  . Marital status: Single    Spouse name: N/A  . Number of children: N/A  . Years of education: N/A   Occupational History  . Not on file.   Social History Main Topics  . Smoking status: Former Smoker    Types: Pipe    Quit date: 09/21/2008  . Smokeless tobacco: Former Systems developer    Quit date: 09/21/2008  . Alcohol use No  . Drug use: No  . Sexual activity: No   Other Topics Concern  . Not on file   Social History Narrative   ** Merged History Encounter **        Family Status  Relation Status  . Mother Deceased  . Father Deceased  . Maternal Grandmother Deceased  . Maternal Grandfather Deceased  . Paternal Grandmother Deceased  .  Paternal Grandfather Deceased  . Neg Hx    Family History  Problem Relation Age of Onset  . Arthritis Mother   . Heart disease Mother   . Heart attack Mother   . Other Father     smoker  . Hypertension Neg Hx     unknown  . Stroke Neg Hx     unknown     ROS:  Full 14 point review of systems complete and found to be negative unless listed above.  Physical Exam: Blood pressure 128/94, pulse (!) 59, temperature 98.1 F (36.7 C), temperature source Oral, resp. rate 16, height 5\' 11"  (1.803 m), weight 205 lb (93 kg), SpO2 100 %.  General: Well developed, well nourished, male in no acute distress Head: Eyes PERRLA, No xanthomas. Normocephalic and atraumatic, oropharynx without edema or exudate.  Lungs: Resp regular and unlabored, CTA. Heart: RRR no s3, s4, or murmurs..   Neck: No carotid bruits. No lymphadenopathy.  No JVD. Abdomen: Bowel sounds present, abdomen soft and non-tender without masses or hernias noted. Msk:  No spine or cva tenderness. No weakness, no joint deformities or effusions. Extremities: No clubbing, cyanosis or edema. DP/PT/Radials 2+ and equal bilaterally. Neuro: Alert and  oriented X 3. No focal deficits noted. Psych:  Good affect, responds appropriately Skin: No rashes or lesions noted.  Labs:   Lab Results  Component Value Date   WBC 4.4 10/25/2016   HGB 16.4 10/25/2016   HCT 49.7 10/25/2016   MCV 90.4 10/25/2016   PLT 138 (L) 10/25/2016    Recent Labs  10/25/16 0914  INR 1.79    Recent Labs Lab 10/25/16 0914  NA 137  K 4.7  CL 103  CO2 25  BUN 21*  CREATININE 1.19  CALCIUM 8.9  PROT 7.6  BILITOT 0.9  ALKPHOS 97  ALT 19  AST 27  GLUCOSE 80  ALBUMIN 3.7   Magnesium  Date Value Ref Range Status  02/27/2016 1.8 1.7 - 2.4 mg/dL Final   No results for input(s): CKTOTAL, CKMB, TROPONINI in the last 72 hours.  Recent Labs  10/25/16 0917  TROPIPOC 0.00   Lab Results  Component Value Date   CHOL 228 (H) 02/27/2016   HDL 50 02/27/2016   LDLCALC 162 (H) 02/27/2016   TRIG 79 02/27/2016  No results found for: LIPASE, AMYLASE TSH  Date/Time Value Ref Range Status  02/27/2016 01:34 AM 4.689 (H) 0.350 - 4.500 uIU/mL Final  04/20/2014 06:35 PM 1.900 0.350 - 4.500 uIU/mL Final    Comment:    Please note change in reference range.   T3, Free  Date/Time Value Ref Range Status  02/27/2016 01:30 PM 1.8 (L) 2.0 - 4.4 pg/mL Final    Comment:    (NOTE) Performed At: [facility] [place], [place] [id number] Lindon Romp MD [id number]    No results found for: VITAMINB12, FOLATE, FERRITIN, TIBC, IRON, RETICCTPCT  Myoview 04/21/2014 FINDINGS: SPECT: Scar at the apex and septal apex is noted. There is a perfusion defects involving the inferolateral wall on stress imaging with sparing of the lateral apex at re- perfuses on rest imaging. Gated images demonstrate diminished motion and thickening in this region. Although, mid wall and basilar ischemia would be unusual, a cannot be excluded.  Wall motion: Inferolateral hypokinesis.  Ejection fraction: 36%. End-diastolic volume [id number] cc. End  systolic volume 86 cc.  IMPRESSION: Inferolateral wall ischemia cannot be excluded. See above discussion  Apical scar. ** reviewed with / by  Dr. Sallyanne Kuster and deemed low risk study **  RHC 10/12/15 RA = 7 RV = 28/1/8 PA = 32/15 (20) PCW = 12 Fick cardiac output/index = 3.6/1.6 Thermo cardiac output/index = 3.5/1.5 PVR = 2.2 WU PA sat = 44%, 47% Ao sat = 98%  Echo 10/05/15 LVEF 30-35%, Mild MR, PA peak pressure 34 mmHg Echo 02/27/16 LVEF 55-60%, Grade 2 DD dysfunction. LAE mildly dilated, PA peak pressure 35 mm Hg.   Radiology:  Dg Chest 2 View  Result Date: 10/25/2016 CLINICAL DATA:  Chest pain.  Cold sweats. EXAM: CHEST  2 VIEW COMPARISON:  03/25/2016 FINDINGS: Heart size is stable and within normal limits. Patient has had a median sternotomy procedure. Multiple surgical clips in the anterior chest. There is a right cardiac ICD with a lead extending into the right ventricle and this appears stable. Lungs are clear without airspace disease or pulmonary edema. No significant pleural effusions. No acute bone abnormality. IMPRESSION: No active cardiopulmonary disease. Electronically Signed   By: Markus Daft M.D.   On: 10/25/2016 09:31    ASSESSMENT AND PLAN:     1. Chest pain - Atypical. Occur at 2 AM which he described as achy sensation at the left upper chest. This is lasted for about an hour and resolved with prayer. Around 7 AM he had an episode of diaphoresis with lightheadedness after coming back from bathroom. No chest pressure or dyspnea. The symptoms is different from his prior cardiac pain. EKG without acute changes. Point-of-care troponin negative. He is not in heart failure. Will get outpatient stress test and f/u with CHF clinic. He can be discharge if delta troponin is negative.   2. Chronic systolic congestive heart failure - Echo 02/27/16 Echo improved to 55-60% from echo 10/05/15 LVEF 30-35% - He is euvolemic. Continue current home regimen.  3. ICM s/p ICD  4. PAF -  maintaining sinus rhythm on amiodarone. CHADSVASc score of 6. On Coumadin. Patient has a upcoming procedure for dental  Extraction. Coumadin clinic is managing with bridging with Lovenox.  5. CAD s/p CABG - No chest pressure or dyspnea. Last Myoview 03/2014 read as perfusion defect in inferior lateral wall and cannot excluded ischemia. Apical scar. Reviewed with/by Dr. Sallyanne Kuster and deemed low risk study per note (04/22/14).  6. HTN - Stable and well controlled.   SignedLeanor Kail, PA 10/25/2016, 1:25 PM Pager [phone]  Co-Sign MD

## 2016-10-25 NOTE — ED Notes (Signed)
Patient transported to X-ray 

## 2016-10-25 NOTE — ED Triage Notes (Signed)
Pt reports taking fluid pills, got up to use restroom early morning and had left side chest pressure. Denies SOB. Had episode of diaphoresis. Recently treated for dental infection, also has cardiac history. ekg done at triage, no acute distress noted.

## 2016-10-25 NOTE — ED Notes (Signed)
Pt was given turkey sandwich. 

## 2016-10-25 NOTE — ED Provider Notes (Signed)
[facility] DEPT Provider Note   CSN: [id number] Arrival date & time: 10/25/16  [id number]     History   Chief Complaint Chief Complaint  Patient presents with  . Chest Pain    HPI [facility]. is a 70 y.o. male.  The history is provided by the patient and medical records.  Chest Pain   Associated symptoms include diaphoresis.   70 year old male with history of A. fib on Coumadin, coronary artery disease, hypertension, hyperlipidemia, history of stroke, presenting to the ED for episodes of chest pain this morning. He states he awoke around 2 AM to use the bathroom due to his diuretics. States when walking back to his bed he developed some chest pressure. States he laid back down in the bed and he had his wife prayed and he relaxed that he felt better. States he woke again at 7 AM again with some chest pressure. This episode was accompanied by diaphoresis and lightheadedness. He has not had any shortness of breath, dizziness, or weakness. States no pain at this time.  States overall he has been doing well the past few months. He was previously having some home visits to check on his cardiac status, however he was released from this. He is followed by cardiology, Dr. Haroldine Laws.  Last checkup was a few months ago, was doing well at that time. He did recently get married 3 months ago, denies any added stress from baseline.  Did recently have dental infection.  Completed all his abx from this.  No fever or chills noted.  Past Medical History:  Diagnosis Date  . Atrial fibrillation   09/22/2012  . CAD (coronary artery disease)   . Chronic systolic heart failure ([place])   . 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 History of MRSA infection 05/2011  . HTN (hypertension)   . Hyperlipidemia   . implantable cardiac defibrillator-Biotronik    Device Implanted 2006; s/p gen change 03/2011 : bleeding persistent with pocket erosion and infection; explant and  reimplant  06/2011  . Ischemic cardiomyopathy    EF 15 to 20% by TTE and TEE in 09/2012.  Severe LV dysfunction  . Obesity    BMI 31 in 09/2012  . Seizure disorder ([place]) latest 09/30/2012  . Seizures ([place])   . Stroke [facility])     Patient Active Problem List   Diagnosis Date Noted  . Periodontal disease 05/18/2016  . Oral infection 05/18/2016  . HLD (hyperlipidemia) 02/26/2016  . CVA (cerebral infarction) 02/26/2016  . Near syncope 02/26/2016  . Injury of right thumb 12/30/2015  . Gout 12/30/2015  . Loss of consciousness ([place]) 01/05/2015  . Afib ([place]) 04/21/2014  . Encounter for therapeutic drug monitoring 01/19/2014  . Chronic systolic heart failure ([place]) 11/06/2012  . Factor VII deficiency ([place]) 10/07/2012  . Coagulopathy ([place]) 10/03/2012  . Seizure ([place]) 10/01/2012  . Renal insufficiency 10/01/2012  . Ischemic cardiomyopathy  ? additional rate component 09/29/2012  . Automatic implantable cardioverter-defibrillator in situ   . HTN (hypertension) 09/21/2012  . CAD (coronary artery disease) s/p cabg 09/21/2012    Past Surgical History:  Procedure Laterality Date  . CARDIAC CATHETERIZATION N/A 10/12/2015   Procedure: Right Heart Cath;  Surgeon: [name], MD;  Location: [facility] CV LAB;  Service: Cardiovascular;  Laterality: N/A;  . CARDIOVERSION  09/24/2012   Procedure: CARDIOVERSION;  Surgeon: [name], MD;  Location: [facility];  Service: Cardiovascular;  Laterality: N/A;  .  CORONARY ARTERY BYPASS GRAFT    . ICD    . TEE WITHOUT CARDIOVERSION  09/24/2012   Procedure: TRANSESOPHAGEAL ECHOCARDIOGRAM (TEE);  Surgeon: [name], MD;  Location: [facility] ENDOSCOPY;  Service: Cardiovascular;  Laterality: N/A;  dave/anesth, dl, cindy/echo        Home Medications    Prior to Admission medications   Medication Sig Start Date End Date Taking? Authorizing Provider  amiodarone (PACERONE) 200 MG tablet Take 1 tablet (200 mg total) by mouth daily. 08/24/16    [name], MD  carvedilol (COREG) 3.125 MG tablet Take 1 tablet (3.125 mg total) by mouth 2 (two) times daily. 07/24/16   [name], MD  furosemide (LASIX) 40 MG tablet Take 1 tablet (40 mg total) by mouth daily. 07/24/16   [name], MD  isosorbide-hydrALAZINE (BIDIL) 20-37.5 MG tablet Take 2 tablets by mouth 3 (three) times daily. 06/19/16   Shirley Friar, PA-C  losartan (COZAAR) 25 MG tablet Take 1 tablet (25 mg total) by mouth daily. 03/13/16   Shirley Friar, PA-C  MULTIPLE VITAMIN PO Take by mouth daily.    Historical Provider, MD  nystatin (MYCOSTATIN) 100000 UNIT/ML suspension SWISH, GARGLE AND SPIT 5 TO 10 ML BY MOUTH EVERY 6 HOURS AS NEEDED 10/01/16   [name], MD  phenytoin (DILANTIN) 100 MG ER capsule Take 1 capsule (100 mg total) by mouth 2 (two) times daily. 08/24/16   [name], MD  simvastatin (ZOCOR) 20 MG tablet Take 1 tablet (20 mg total) by mouth daily at 6 PM. 04/02/16   [name], MD  spironolactone (ALDACTONE) 25 MG tablet Take 0.5 tablets (12.5 mg total) by mouth daily. 09/11/16   [name], MD  triamcinolone cream (KENALOG) 0.1 % Apply 1 application topically 2 (two) times daily. 08/16/16   [name], MD  warfarin (COUMADIN) 4 MG tablet Take 1 tablet (4 mg total) by mouth daily. 08/24/16   [name], MD    Family History Family History  Problem Relation Age of Onset  . Arthritis Mother   . Heart disease Mother   . Heart attack Mother   . Other Father     smoker  . Hypertension Neg Hx     unknown  . Stroke Neg Hx     unknown    Social History Social History  Substance Use Topics  . Smoking status: Former Smoker    Types: Pipe    Quit date: 09/21/2008  . Smokeless tobacco: Former Systems developer    Quit date: 09/21/2008  . Alcohol use No     Allergies   Review of patient's allergies indicates no known allergies.   Review of Systems Review of Systems  Constitutional: Positive for  diaphoresis.  Cardiovascular: Positive for chest pain.  All other systems reviewed and are negative.    Physical Exam Updated Vital Signs BP 131/92 (BP Location: Left Arm)   Pulse 69   Temp 98.1 F (36.7 C) (Oral)   Resp 20   Ht 5\' 11"  (1.803 m)   Wt 93 kg   SpO2 100%   BMI 28.59 kg/m   Physical Exam  Constitutional: He is oriented to person, place, and time. He appears well-developed and well-nourished.  HENT:  Head: Normocephalic and atraumatic.  Mouth/Throat: Oropharynx is clear and moist.  Eyes: Conjunctivae and EOM are normal. Pupils are equal, round, and reactive to light.  Neck: Normal range of motion.  Cardiovascular: Normal rate, regular  rhythm and normal heart sounds.   NSR, no murmur or rub noted  Pulmonary/Chest: Effort normal and breath sounds normal. He has no decreased breath sounds. He has no wheezes. He has no rhonchi.  defib present in right chest wall, lungs clear  Abdominal: Soft. Bowel sounds are normal.  Musculoskeletal: Normal range of motion.  Neurological: He is alert and oriented to person, place, and time.  Skin: Skin is warm and dry.  Psychiatric: He has a normal mood and affect.  Nursing note and vitals reviewed.    ED Treatments / Results  Labs (all labs ordered are listed, but only abnormal results are displayed) Labs Reviewed  CBC - Abnormal; Notable for the following:       Result Value   Platelets 138 (*)    All other components within normal limits  PROTIME-INR - Abnormal; Notable for the following:    Prothrombin Time 21.0 (*)    All other components within normal limits  COMPREHENSIVE METABOLIC PANEL - Abnormal; Notable for the following:    BUN 21 (*)    All other components within normal limits  BRAIN NATRIURETIC PEPTIDE  I-STAT TROPOININ, ED  I-STAT TROPOININ, ED    EKG  EKG Interpretation  Date/Time:  Thursday October 25 2016 08:50:07 EDT Ventricular Rate:  42 PR Interval:  174 QRS Duration: 100 QT  Interval:  552 QTC Calculation: 460 R Axis:   150 Text Interpretation:  Undetermined rhythm Low voltage QRS Left posterior fascicular block Possible Inferior infarct , age undetermined ST & Marked T wave abnormality, consider anterolateral ischemia Abnormal ECG please discard EKG due to poor data quality Confirmed by Hazle Coca [phone]) on 10/25/2016 8:56:28 AM       Radiology Dg Chest 2 View  Result Date: 10/25/2016 CLINICAL DATA:  Chest pain.  Cold sweats. EXAM: CHEST  2 VIEW COMPARISON:  03/25/2016 FINDINGS: Heart size is stable and within normal limits. Patient has had a median sternotomy procedure. Multiple surgical clips in the anterior chest. There is a right cardiac ICD with a lead extending into the right ventricle and this appears stable. Lungs are clear without airspace disease or pulmonary edema. No significant pleural effusions. No acute bone abnormality. IMPRESSION: No active cardiopulmonary disease. Electronically Signed   By: Markus Daft M.D.   On: 10/25/2016 09:31    Procedures Procedures (including critical care time)  Medications Ordered in ED Medications - No data to display   Initial Impression / Assessment and Plan / ED Course  I have reviewed the triage vital signs and the nursing notes.  Pertinent labs & imaging results that were available during my care of the patient were reviewed by me and considered in my medical decision making (see chart for details).  Clinical Course   70 year old male here with 2 episodes of chest pain this morning after getting up and walking to the bathroom. Second episode accompanied by diaphoresis and lightheadedness. Denies any current pain at present. States it is very unusual for him to have pain like this. Has history of CAD and CABG. EKG here sinus rhythm without acute ischemia noted. Labwork is reassuring, troponin negative. INR is subtherapeutic. Chest x-ray is clear. Patient did recently have dental infection and competed abx.  No  fever, murmur, or rub noted to suggest infective endocarditis.  Given new pain which seems to have exertional component, HEART score of 5 placing him at moderate risk.  Will consult with cardiology for recommendations.  3:10 PM Cardiology has evaluated.  Recommended outpatient stress test/echo.  Obtain delta trop here, if negative can be discharged home with clinic follow-up.  Care signed out to oncoming team, will follow-up on troponin.  Final Clinical Impressions(s) / ED Diagnoses   Final diagnoses:  Chest pain, unspecified type    New Prescriptions New Prescriptions   No medications on file     Larene Pickett, PA-C 10/25/16 [place], MD 10/26/16 [phone]

## 2016-10-25 NOTE — Discharge Instructions (Signed)
You have your appts scheduled for your outpatient stress test and follow-up. Continue all your home medications. Return here for any new/worsening symptoms.

## 2016-10-26 ENCOUNTER — Encounter: Payer: Self-pay | Admitting: Internal Medicine

## 2016-10-26 ENCOUNTER — Ambulatory Visit (INDEPENDENT_AMBULATORY_CARE_PROVIDER_SITE_OTHER): Payer: Medicare Other | Admitting: Internal Medicine

## 2016-10-26 VITALS — BP 114/68 | HR 81 | Temp 98.6°F | Resp 18 | Ht 71.0 in | Wt 212.0 lb

## 2016-10-26 DIAGNOSIS — K921 Melena: Secondary | ICD-10-CM | POA: Insufficient documentation

## 2016-10-26 DIAGNOSIS — Z23 Encounter for immunization: Secondary | ICD-10-CM | POA: Diagnosis not present

## 2016-10-26 DIAGNOSIS — R195 Other fecal abnormalities: Secondary | ICD-10-CM | POA: Diagnosis not present

## 2016-10-26 DIAGNOSIS — K047 Periapical abscess without sinus: Secondary | ICD-10-CM

## 2016-10-26 DIAGNOSIS — R0789 Other chest pain: Secondary | ICD-10-CM

## 2016-10-26 MED ORDER — AMOXICILLIN 500 MG PO CAPS: 500.0000 mg | ORAL_CAPSULE | Freq: Three times a day (TID) | ORAL | 0 refills | Status: DC

## 2016-10-26 NOTE — Assessment & Plan Note (Signed)
Rx for amoxicillin TID for 10 days so he can get dental work done. Needs to follow up with his dentist and may need adjustment of coumadin on amoxicillin.

## 2016-10-26 NOTE — Progress Notes (Signed)
Pre visit review using our clinic review tool, if applicable. No additional management support is needed unless otherwise documented below in the visit note. 

## 2016-10-26 NOTE — Assessment & Plan Note (Signed)
Found on hemoccult, refer to GI as never colonoscopy. Could be related to warfarin however warrants screening assuming stress test Monday is normal.

## 2016-10-26 NOTE — Patient Instructions (Addendum)
We will get you in to GI doctor for the colonoscopy to check for the positive blood in the stool.   We have sent in an antibiotic for the teeth called amoxicillin that you take 1 pill 3 times a day for 1 week. You need to return to the dentist for further care.

## 2016-10-26 NOTE — Progress Notes (Signed)
   Subjective:    Patient ID: Louis Dandy., male    DOB: 1946-01-18, 70 y.o.   MRN: [id number]  HPI The patient is a 70 YO man coming in for several concerns. First is blood in stool (detected on stool occult test positive, no hemorrhoids, never colonoscopy, takes warfarin, no change in bowel habits, no weight change) and recent chest pain (went to ER yesterday and getting stress test Monday, no current chest pain, has cardiologist and seeing them after stress test), and dental infection (needs teeth pulled but has current infection and the dentist will not work with them, they also need to transition to bridge for warfarin for procedure, they will not put him to sleep but just local anaesthetic, some infection and mild swelling in his neck from it).   Review of Systems  Constitutional: Negative for activity change, appetite change, fatigue, fever and unexpected weight change.  HENT: Positive for dental problem. Negative for congestion, ear discharge, ear pain, mouth sores, sore throat and trouble swallowing.   Eyes: Negative.   Respiratory: Negative.   Cardiovascular: Negative.   Gastrointestinal: Negative for anal bleeding, blood in stool, diarrhea, nausea and vomiting.  Genitourinary: Negative.   Musculoskeletal: Negative.   Skin: Negative.   Neurological: Negative.       Objective:   Physical Exam  Constitutional: He is oriented to person, place, and time. He appears well-developed and well-nourished.  HENT:  Head: Normocephalic and atraumatic.  Eyes: EOM are normal.  Cardiovascular: Normal rate and regular rhythm.   Pulmonary/Chest: Effort normal. No respiratory distress. He has no wheezes. He has no rales.  Abdominal: Soft. Bowel sounds are normal. He exhibits no distension. There is no tenderness. There is no rebound.  Musculoskeletal: He exhibits no edema.  Neurological: He is alert and oriented to person, place, and time. Coordination normal.  Skin: Skin is warm and dry.    Vitals:   10/26/16 1408  BP: 114/68  Pulse: 81  Resp: 18  Temp: 98.6 F (37 C)  TempSrc: Oral  SpO2: 97%  Weight: 212 lb (96.2 kg)  Height: 5\' 11"  (1.803 m)      Assessment & Plan:  Flu shot given at visit

## 2016-10-26 NOTE — Assessment & Plan Note (Signed)
No pain today, getting stress test Monday and will follow with cardiology. Taking long acting nitro now.

## 2016-10-29 ENCOUNTER — Telehealth: Payer: Self-pay | Admitting: Internal Medicine

## 2016-10-29 ENCOUNTER — Ambulatory Visit (HOSPITAL_COMMUNITY): Payer: Medicare Other | Attending: Cardiovascular Disease

## 2016-10-29 DIAGNOSIS — R0789 Other chest pain: Secondary | ICD-10-CM

## 2016-10-29 DIAGNOSIS — R9439 Abnormal result of other cardiovascular function study: Secondary | ICD-10-CM | POA: Diagnosis not present

## 2016-10-29 LAB — MYOCARDIAL PERFUSION IMAGING
CHL CUP NUCLEAR SSS: 6
LHR: 0.32
LVDIAVOL: 146 mL (ref 62–150)
LVSYSVOL: 89 mL
Peak HR: 93 {beats}/min
Rest HR: 65 {beats}/min
SDS: 3
SRS: 3
TID: 1.16

## 2016-10-29 MED ORDER — TECHNETIUM TC 99M TETROFOSMIN IV KIT
10.1000 | PACK | Freq: Once | INTRAVENOUS | Status: AC | PRN
  Administered 2016-10-29: 10.1 via INTRAVENOUS
  Filled 2016-10-29: qty 11

## 2016-10-29 MED ORDER — TECHNETIUM TC 99M TETROFOSMIN IV KIT
31.4000 | PACK | Freq: Once | INTRAVENOUS | Status: AC | PRN
  Administered 2016-10-29: 31.4 via INTRAVENOUS
  Filled 2016-10-29: qty 32

## 2016-10-29 MED ORDER — REGADENOSON 0.4 MG/5ML IV SOLN
0.4000 mg | Freq: Once | INTRAVENOUS | Status: AC
  Administered 2016-10-29: 0.4 mg via INTRAVENOUS

## 2016-10-29 MED ORDER — AMINOPHYLLINE 25 MG/ML IV SOLN
75.0000 mg | Freq: Once | INTRAVENOUS | Status: AC
  Administered 2016-10-29: 75 mg via INTRAVENOUS

## 2016-10-29 NOTE — Telephone Encounter (Signed)
Pt request refill for nystatin (MYCOSTATIN) 100000 UNIT/ML suspension send to [facility]. Please advise.

## 2016-10-30 MED ORDER — NYSTATIN 100000 UNIT/ML MT SUSP: OROMUCOSAL | 0 refills | Status: DC

## 2016-10-30 NOTE — Telephone Encounter (Signed)
Refill sent in

## 2016-10-31 ENCOUNTER — Encounter: Payer: Self-pay | Admitting: Cardiology

## 2016-11-07 ENCOUNTER — Inpatient Hospital Stay (HOSPITAL_COMMUNITY): Payer: Medicare Other

## 2016-11-08 ENCOUNTER — Ambulatory Visit (HOSPITAL_COMMUNITY)
Admission: RE | Admit: 2016-11-08 | Discharge: 2016-11-08 | Disposition: A | Discharge status: Home / Self Care | Payer: Medicare Other | Source: Ambulatory Visit | Attending: Internal Medicine | Admitting: Internal Medicine

## 2016-11-08 VITALS — BP 124/98 | HR 71 | Wt 217.4 lb

## 2016-11-08 DIAGNOSIS — E669 Obesity, unspecified: Secondary | ICD-10-CM | POA: Diagnosis not present

## 2016-11-08 DIAGNOSIS — R0602 Shortness of breath: Secondary | ICD-10-CM | POA: Diagnosis not present

## 2016-11-08 DIAGNOSIS — I480 Paroxysmal atrial fibrillation: Secondary | ICD-10-CM

## 2016-11-08 DIAGNOSIS — Z95810 Presence of automatic (implantable) cardiac defibrillator: Secondary | ICD-10-CM | POA: Insufficient documentation

## 2016-11-08 DIAGNOSIS — Z8249 Family history of ischemic heart disease and other diseases of the circulatory system: Secondary | ICD-10-CM | POA: Diagnosis not present

## 2016-11-08 DIAGNOSIS — Z823 Family history of stroke: Secondary | ICD-10-CM | POA: Diagnosis not present

## 2016-11-08 DIAGNOSIS — Z951 Presence of aortocoronary bypass graft: Secondary | ICD-10-CM | POA: Insufficient documentation

## 2016-11-08 DIAGNOSIS — Z79899 Other long term (current) drug therapy: Secondary | ICD-10-CM | POA: Diagnosis not present

## 2016-11-08 DIAGNOSIS — I130 Hypertensive heart and chronic kidney disease with heart failure and stage 1 through stage 4 chronic kidney disease, or unspecified chronic kidney disease: Secondary | ICD-10-CM | POA: Insufficient documentation

## 2016-11-08 DIAGNOSIS — I10 Essential (primary) hypertension: Secondary | ICD-10-CM

## 2016-11-08 DIAGNOSIS — M109 Gout, unspecified: Secondary | ICD-10-CM | POA: Diagnosis not present

## 2016-11-08 DIAGNOSIS — F819 Developmental disorder of scholastic skills, unspecified: Secondary | ICD-10-CM | POA: Diagnosis not present

## 2016-11-08 DIAGNOSIS — E785 Hyperlipidemia, unspecified: Secondary | ICD-10-CM | POA: Diagnosis not present

## 2016-11-08 DIAGNOSIS — R55 Syncope and collapse: Secondary | ICD-10-CM | POA: Diagnosis not present

## 2016-11-08 DIAGNOSIS — I5022 Chronic systolic (congestive) heart failure: Secondary | ICD-10-CM | POA: Insufficient documentation

## 2016-11-08 DIAGNOSIS — Z8673 Personal history of transient ischemic attack (TIA), and cerebral infarction without residual deficits: Secondary | ICD-10-CM | POA: Insufficient documentation

## 2016-11-08 DIAGNOSIS — Z9889 Other specified postprocedural states: Secondary | ICD-10-CM | POA: Diagnosis not present

## 2016-11-08 DIAGNOSIS — Z87891 Personal history of nicotine dependence: Secondary | ICD-10-CM | POA: Diagnosis not present

## 2016-11-08 DIAGNOSIS — G40909 Epilepsy, unspecified, not intractable, without status epilepticus: Secondary | ICD-10-CM | POA: Diagnosis not present

## 2016-11-08 DIAGNOSIS — I2510 Atherosclerotic heart disease of native coronary artery without angina pectoris: Secondary | ICD-10-CM | POA: Insufficient documentation

## 2016-11-08 DIAGNOSIS — N183 Chronic kidney disease, stage 3 unspecified: Secondary | ICD-10-CM

## 2016-11-08 DIAGNOSIS — D682 Hereditary deficiency of other clotting factors: Secondary | ICD-10-CM | POA: Diagnosis not present

## 2016-11-08 DIAGNOSIS — Z7901 Long term (current) use of anticoagulants: Secondary | ICD-10-CM | POA: Diagnosis not present

## 2016-11-08 DIAGNOSIS — Z8261 Family history of arthritis: Secondary | ICD-10-CM | POA: Insufficient documentation

## 2016-11-08 DIAGNOSIS — I255 Ischemic cardiomyopathy: Secondary | ICD-10-CM | POA: Diagnosis not present

## 2016-11-08 DIAGNOSIS — M1A9XX0 Chronic gout, unspecified, without tophus (tophi): Secondary | ICD-10-CM

## 2016-11-08 LAB — BASIC METABOLIC PANEL
Anion gap: 6 (ref 5–15)
BUN: 19 mg/dL (ref 6–20)
CALCIUM: 8.7 mg/dL — AB (ref 8.9–10.3)
CO2: 26 mmol/L (ref 22–32)
CREATININE: 1.14 mg/dL (ref 0.61–1.24)
Chloride: 106 mmol/L (ref 101–111)
GFR calc non Af Amer: >60 mL/min (ref >60)
Glucose, Bld: 74 mg/dL (ref 65–99)
Potassium: 4.5 mmol/L (ref 3.5–5.1)
SODIUM: 138 mmol/L (ref 135–145)

## 2016-11-08 LAB — BRAIN NATRIURETIC PEPTIDE: B NATRIURETIC PEPTIDE 5: 170 pg/mL — AB (ref 0.0–100.0)

## 2016-11-08 NOTE — Patient Instructions (Signed)
INCREASE lasix to 40 mg twice daily for TWO days, then reduce to 40 mg once daily.  Routine lab work today. Will notify you of abnormal results, otherwise no news is good news!  Follow up as scheduled.  Do the following things EVERYDAY: 1) Weigh yourself in the morning before breakfast. Write it down and keep it in a log. 2) Take your medicines as prescribed 3) Eat low salt foods-Limit salt (sodium) to 2000 mg per day.  4) Stay as active as you can everyday 5) Limit all fluids for the day to less than 2 liters

## 2016-11-08 NOTE — Progress Notes (Signed)
Patient ID: Louis Ford., male   DOB: Sep 20, 1946, 70 y.o.   MRN: [id number]    Advanced Heart Failure Clinic Note   Primary Care: Dr Pricilla Holm Primary Cardiologist: Dr Lovena Le Primary HF: Dr. Haroldine Laws  HPI: Louis Ford Louis Ford. is a 70 y.o. male with hx of CAD s/p CABG, ICM s/p Biotronik ICD, chronic systolic CHF Echo 99991111 LVEF 30-35% -> improved to 55-60% (3/17),HTN, HLD, atrial fibrillation, previous stroke, Factor 7 deficiency, and seizure disorder.  Admitted from [facility] in 10/16 with ADHF in setting of AF with RVR. He diuresed well initially on IV lasix 40 mg BID, but continued to have SOB and was thus on milrinoneon 10/14 for concerns of low output. RHC on 10/12/15 that showed depressed cardiac output with Fick CO/CI 3.6/1.6. Improved on milrinone and remained stable with wean. Bidil started and increased to goal dose.Also started on amio for afib rate control. D/c weight 229 lbs  Admitted 02/26/16 after two near syncopal episodes. ICD interrogation negative for any acute events. Suspect related to overdiuresis and hypotension with recurrent Afib.  Spontaneously converted to NSR without intervention. Echo EF 55-60%  He returns today for regular follow up. Had recent visit to ED with atypical chest pain. Troponin negative.  Had myoview Monday, no significant ischemia, EF 39%. Still having occasional left chest pressure, where his ICD used to be. Can last up to 20 minutes or so. Not related to exertion.   Weight up 9 lbs since last visit. Hasn't taken any extra fluid pills. Can walk about 2 blocks without difficulty. Does have knee and back pain.  Watching fluid and salt.  Not weighing daily. Wife helps with medicine and transport.  Has graduated paramedicine. No bendopnea/PND. Sleeps on 2 pillows chronically for comfort.   RHC 10/12/15 RA = 7 RV = 28/1/8 PA = 32/15 (20) PCW = 12 Fick cardiac output/index = 3.6/1.6 Thermo cardiac output/index = 3.5/1.5 PVR = 2.2 WU PA sat =  44%, 47% Ao sat = 98%  Echo 10/05/15 LVEF 30-35%, Mild MR, PA peak pressure 34 mmHg Echo 02/27/16 LVEF 55-60%, Grade 2 DD dysfunction. LAE mildly dilated, PA peak pressure 35 mm Hg.   Labs: 12/30/15 K 5.0, creatinine 1.3 Labs: 02/29/16 K 4.7, creatinine 1.27 Labs: 03/25/2016: K 4.3 Creatinine 1.31   Past Medical History:  Diagnosis Date  . Atrial fibrillation   09/22/2012  . CAD (coronary artery disease)   . Chronic systolic heart failure ([place])   . 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 History of MRSA infection 05/2011  . HTN (hypertension)   . Hyperlipidemia   . implantable cardiac defibrillator-Biotronik    Device Implanted 2006; s/p gen change 03/2011 : bleeding persistent with pocket erosion and infection; explant and reimplant  06/2011  . Ischemic cardiomyopathy    EF 15 to 20% by TTE and TEE in 09/2012.  Severe LV dysfunction  . Obesity    BMI 31 in 09/2012  . Seizure disorder ([place]) latest 09/30/2012  . Seizures ([place])   . Stroke [facility])     Current Outpatient Prescriptions  Medication Sig Dispense Refill  . acetaminophen (TYLENOL) 325 MG tablet Take 650 mg by mouth every 6 (six) hours as needed for mild pain.    [name] amiodarone (PACERONE) 200 MG tablet Take 1 tablet (200 mg total) by mouth daily. 30 tablet 3  . amoxicillin (AMOXIL) 500 MG capsule Take 1 capsule (500 mg total) by mouth 3 (three) times  daily. 30 capsule 0  . Ascorbic Acid (VITAMIN C) 100 MG tablet Take 100 mg by mouth daily.    . carvedilol (COREG) 3.125 MG tablet Take 1 tablet (3.125 mg total) by mouth 2 (two) times daily. 180 tablet 1  . furosemide (LASIX) 40 MG tablet Take 1 tablet (40 mg total) by mouth daily. 90 tablet 1  . isosorbide-hydrALAZINE (BIDIL) 20-37.5 MG tablet Take 2 tablets by mouth 3 (three) times daily. 180 tablet 3  . losartan (COZAAR) 25 MG tablet Take 1 tablet (25 mg total) by mouth daily. 90 tablet 3  . MULTIPLE VITAMIN PO Take by mouth daily.    [name]  nystatin (MYCOSTATIN) 100000 UNIT/ML suspension SWISH, GARGLE AND SPIT 5 TO 10 ML BY MOUTH EVERY 6 HOURS AS NEEDED 240 mL 0  . phenytoin (DILANTIN) 100 MG ER capsule Take 1 capsule (100 mg total) by mouth 2 (two) times daily. 180 capsule 3  . simvastatin (ZOCOR) 20 MG tablet Take 1 tablet (20 mg total) by mouth daily at 6 PM. 90 tablet 3  . spironolactone (ALDACTONE) 25 MG tablet Take 0.5 tablets (12.5 mg total) by mouth daily. 15 tablet 6  . triamcinolone cream (KENALOG) 0.1 % Apply 1 application topically 2 (two) times daily. 30 g 0  . warfarin (COUMADIN) 4 MG tablet Take 1 tablet (4 mg total) by mouth daily. 30 tablet 1   No current facility-administered medications for this encounter.     No Known Allergies    Social History   Social History  . Marital status: Single    Spouse name: N/A  . Number of children: N/A  . Years of education: N/A   Occupational History  . Not on file.   Social History Main Topics  . Smoking status: Former Smoker    Types: Pipe    Quit date: 09/21/2008  . Smokeless tobacco: Former Systems developer    Quit date: 09/21/2008  . Alcohol use No  . Drug use: No  . Sexual activity: No   Other Topics Concern  . Not on file   Social History Narrative   ** Merged History Encounter **          Family History  Problem Relation Age of Onset  . Arthritis Mother   . Heart disease Mother   . Heart attack Mother   . Other Father     smoker  . Hypertension Neg Hx     unknown  . Stroke Neg Hx     unknown    Vitals:   11/08/16 1025  BP: (!) 124/98  Pulse: 71  SpO2: 99%  Weight: 217 lb 6.4 oz (98.6 kg)     Wt Readings from Last 3 Encounters:  11/08/16 217 lb 6.4 oz (98.6 kg)  10/29/16 205 lb (93 kg)  10/26/16 212 lb (96.2 kg)     PHYSICAL EXAM: General:  Elderly appearing, NAD HEENT: normal Neck: supple. JVD 8-9 cm. Carotids 2+ bilat; no bruits. No thyromegaly or nodule noted.  Cor: PMI nondisplaced. RRR. No M/G/R Lungs: CTAB, normal effort.    Abdomen: soft, NT, ND, no HSM. No bruits or masses. +BS  Extremities: no cyanosis, clubbing, rash. Trace ankle edema  Neuro: alert & oriented x 3, cranial nerves grossly intact. moves all 4 extremities w/o difficulty. Affect pleasant.  ASSESSMENT & PLAN:  1. Chronic Systolic CHF, Echo [id number] Echo improved to 55-60% from echo 10/05/15 LVEF 30-35%  - NYHA class II.  Volume status mildly elevated on exam.   -  Continue lasix 40 mg daily.  Will have him take extra lasix x 2 days. Can repeat extra 40 mg as needed for weight gain of 3 lbs overnight or 4 lbs within one week.  - Continue carvedilol 3.125 bid - Continue spiro 12.5 mg daily.  - Continue losartan 25 mg daily.  - Continue Bidil 2 tabs TID.  2. Paroxysmal Atrial Fibrillation  -Remains in NSR - Continue amiodarone 200 mg daily. On coumadin. No bleeding problems.   - Coumadin clinic following and adjusting doses.  3. CAD s/p CABG - Stress test reviewed with patient.  No ongoing ischemia.  - Continue current medical therapy.  4. HTN - Stable on current medications.  5. CKD stage III - BMET today.  6. Gout - On allopurinol 200 mg daily. Followed by PCP  7. Learning disability   Mildly volume overloaded will have take extra lasix x 2 days.  Keep appt for one month with Dr. Haroldine Laws. BMET/BNP today.   Shirley Friar, PA-C  10:32 AM   Total time spent > 25 minutes over half that spent discussing the above.

## 2016-11-17 ENCOUNTER — Other Ambulatory Visit: Payer: Self-pay | Admitting: Internal Medicine

## 2016-11-17 ENCOUNTER — Other Ambulatory Visit (HOSPITAL_COMMUNITY): Payer: Self-pay | Admitting: Student

## 2016-11-19 ENCOUNTER — Telehealth: Payer: Self-pay | Admitting: Internal Medicine

## 2016-11-19 NOTE — Telephone Encounter (Signed)
New message ° °Pt is returning call  ° °Please call back °

## 2016-11-21 NOTE — Telephone Encounter (Signed)
Patient was returning call from October.  He states "everything is good now".  Biotronik home monitor remains up to date and transmitting.  Patient denies questions or concerns at this time.

## 2016-11-27 ENCOUNTER — Other Ambulatory Visit: Payer: Self-pay | Admitting: Pharmacist

## 2016-11-27 MED ORDER — WARFARIN SODIUM 4 MG PO TABS: 4.0000 mg | ORAL_TABLET | Freq: Every day | ORAL | 0 refills | Status: DC

## 2016-11-27 NOTE — Telephone Encounter (Signed)
Pt overdue - appt made for INR check.

## 2016-12-03 ENCOUNTER — Telehealth: Payer: Self-pay | Admitting: Internal Medicine

## 2016-12-03 NOTE — Telephone Encounter (Signed)
Pt called Triage-NL pt has had a death in the family and needs to reschedule appt for [facility]-[address] on 12-04-16

## 2016-12-03 NOTE — Telephone Encounter (Signed)
LMOM for pt to return call to reschedule appt.

## 2016-12-05 LAB — CUP PACEART REMOTE DEVICE CHECK
Battery Voltage: 3.01 V
Brady Statistic RV Percent Paced: 0 %
Date Time Interrogation Session: 20171213113353
HighPow Impedance: 59 Ohm
Implantable Lead Implant Date: 20060927
Implantable Lead Serial Number: 10260064
Lead Channel Impedance Value: 468 Ohm
MDC IDC LEAD LOCATION: 753860
MDC IDC PG IMPLANT DT: 20120413
MDC IDC PG SERIAL: 60613417
MDC IDC SET LEADCHNL RV PACING AMPLITUDE: 2 V
MDC IDC SET LEADCHNL RV PACING PULSEWIDTH: 0.4 ms
MDC IDC SET LEADCHNL RV SENSING SENSITIVITY: 0.8 mV

## 2016-12-07 ENCOUNTER — Ambulatory Visit (INDEPENDENT_AMBULATORY_CARE_PROVIDER_SITE_OTHER): Payer: Medicare Other

## 2016-12-07 DIAGNOSIS — Z5181 Encounter for therapeutic drug level monitoring: Secondary | ICD-10-CM | POA: Diagnosis not present

## 2016-12-07 DIAGNOSIS — I4891 Unspecified atrial fibrillation: Secondary | ICD-10-CM

## 2016-12-07 DIAGNOSIS — Z7901 Long term (current) use of anticoagulants: Secondary | ICD-10-CM | POA: Diagnosis not present

## 2016-12-07 LAB — POCT INR: INR: 1.9

## 2016-12-07 MED ORDER — WARFARIN SODIUM 4 MG PO TABS: 4.0000 mg | ORAL_TABLET | Freq: Every day | ORAL | 1 refills | Status: DC

## 2016-12-12 ENCOUNTER — Inpatient Hospital Stay (HOSPITAL_COMMUNITY)
Admission: RE | Admit: 2016-12-12 | Discharge: 2016-12-12 | Disposition: A | Discharge status: Home / Self Care | Payer: Medicare Other | Source: Ambulatory Visit | Attending: Internal Medicine | Admitting: Internal Medicine

## 2016-12-13 ENCOUNTER — Encounter: Payer: Self-pay | Admitting: Internal Medicine

## 2016-12-13 ENCOUNTER — Other Ambulatory Visit (INDEPENDENT_AMBULATORY_CARE_PROVIDER_SITE_OTHER): Payer: Medicare Other

## 2016-12-13 ENCOUNTER — Ambulatory Visit (INDEPENDENT_AMBULATORY_CARE_PROVIDER_SITE_OTHER): Payer: Medicare Other | Admitting: Internal Medicine

## 2016-12-13 VITALS — BP 130/80 | HR 82 | Resp 20 | Wt 224.0 lb

## 2016-12-13 DIAGNOSIS — R21 Rash and other nonspecific skin eruption: Secondary | ICD-10-CM | POA: Diagnosis not present

## 2016-12-13 DIAGNOSIS — K921 Melena: Secondary | ICD-10-CM

## 2016-12-13 DIAGNOSIS — B370 Candidal stomatitis: Secondary | ICD-10-CM | POA: Diagnosis not present

## 2016-12-13 LAB — CBC WITH DIFFERENTIAL/PLATELET
Basophils Absolute: 0 10*3/uL (ref 0.0–0.1)
Basophils Relative: 0.6 % (ref 0.0–3.0)
EOS PCT: 7.1 % — AB (ref 0.0–5.0)
Eosinophils Absolute: 0.3 10*3/uL (ref 0.0–0.7)
HCT: 46.7 % (ref 39.0–52.0)
Hemoglobin: 15.5 g/dL (ref 13.0–17.0)
LYMPHS ABS: 1 10*3/uL (ref 0.7–4.0)
Lymphocytes Relative: 22.9 % (ref 12.0–46.0)
MCHC: 33.2 g/dL (ref 30.0–36.0)
MCV: 90.9 fl (ref 78.0–100.0)
MONOS PCT: 18.3 % — AB (ref 3.0–12.0)
Monocytes Absolute: 0.8 10*3/uL (ref 0.1–1.0)
NEUTROS ABS: 2.2 10*3/uL (ref 1.4–7.7)
NEUTROS PCT: 51.1 % (ref 43.0–77.0)
PLATELETS: 134 10*3/uL — AB (ref 150.0–400.0)
RBC: 5.13 Mil/uL (ref 4.22–5.81)
RDW: 15.3 % (ref 11.5–15.5)
WBC: 4.3 10*3/uL (ref 4.0–10.5)

## 2016-12-13 LAB — BASIC METABOLIC PANEL
BUN: 23 mg/dL (ref 6–23)
CHLORIDE: 103 meq/L (ref 96–112)
CO2: 31 meq/L (ref 19–32)
CREATININE: 1.24 mg/dL (ref 0.40–1.50)
Calcium: 9.1 mg/dL (ref 8.4–10.5)
GFR: 74.55 mL/min (ref >60.00)
Glucose, Bld: 77 mg/dL (ref 70–99)
Potassium: 5.2 mEq/L — ABNORMAL HIGH (ref 3.5–5.1)
Sodium: 137 mEq/L (ref 135–145)

## 2016-12-13 LAB — HEPATIC FUNCTION PANEL
ALK PHOS: 102 U/L (ref 39–117)
ALT: 15 U/L (ref 0–53)
AST: 20 U/L (ref 0–37)
Albumin: 4 g/dL (ref 3.5–5.2)
BILIRUBIN DIRECT: 0.1 mg/dL (ref 0.0–0.3)
Total Bilirubin: 0.4 mg/dL (ref 0.2–1.2)
Total Protein: 7.8 g/dL (ref 6.0–8.3)

## 2016-12-13 MED ORDER — NYSTATIN 100000 UNIT/ML MT SUSP
OROMUCOSAL | 1 refills | Status: DC
Start: 2016-12-13 — End: 2017-01-07

## 2016-12-13 MED ORDER — PREDNISONE 10 MG PO TABS: ORAL_TABLET | ORAL | 0 refills | Status: DC

## 2016-12-13 NOTE — Patient Instructions (Signed)
You had the steroid shot today  Please take all new medication as prescribed - the prednisone  Please continue all other medications as before, and refills have been done if requested - the nystatin mouthwash  Please have the pharmacy call with any other refills you may need.  You will be contacted regarding the referral for: Gastroenterology  Please keep your appointments with your specialists as you may have planned  Please go to the LAB in the Basement (turn left off the elevator) for the tests to be done today  You will be contacted by phone if any changes need to be made immediately.  Otherwise, you will receive a letter about your results with an explanation, but please check with MyChart first.  Please remember to sign up for MyChart if you have not done so, as this will be important to you in the future with finding out test results, communicating by private email, and scheduling acute appointments online when needed.  If you have Medicare related insurance (such as traditoinal Medicare, [employer] or [employer], or similar), Please make an appointment at the Scheduling desk with Maudie Mercury, the [employer], for your Wellness Visit in this office, which is a benefit with your insurance.

## 2016-12-13 NOTE — Progress Notes (Signed)
Pre visit review using our clinic review tool, if applicable. No additional management support is needed unless otherwise documented below in the visit note. 

## 2016-12-13 NOTE — Progress Notes (Signed)
Subjective:    Patient ID: Louis Ford., male    DOB: 1946/01/12, 70 y.o.   MRN: [id number]  HPI  Here with new onset itchy red slight raised unexplained rash x 3 days only to the RUE; denies fever, red streaks, other pain or swelling or drainage.  No other tongue or lip swelling, recent med changes, and Pt denies chest pain, increased sob or doe, wheezing, orthopnea, PND, increased LE swelling, palpitations, dizziness or syncope.  Does also have a rash to dorsal tongue for past 2 wks, now with some worsening discomfort but no swelling or ulcer.  Also mentions 2-3 episodes small to mod volume BRBPR, but denies worsening reflux, abd pain, dysphagia, n/v, bowel change such as diarrhea.  Does have hx of factor 7 deficiency and afib, on coumadin anticoagulant use.  Has not had prior colonoscopy, none noted on chart Past Medical History:  Diagnosis Date  . Atrial fibrillation   09/22/2012  . CAD (coronary artery disease)   . Chronic systolic heart failure ([place])   . 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 History of MRSA infection 05/2011  . HTN (hypertension)   . Hyperlipidemia   . implantable cardiac defibrillator-Biotronik    Device Implanted 2006; s/p gen change 03/2011 : bleeding persistent with pocket erosion and infection; explant and reimplant  06/2011  . Ischemic cardiomyopathy    EF 15 to 20% by TTE and TEE in 09/2012.  Severe LV dysfunction  . Obesity    BMI 31 in 09/2012  . Seizure disorder ([place]) latest 09/30/2012  . Seizures ([place])   . Stroke [facility])    Past Surgical History:  Procedure Laterality Date  . CARDIAC CATHETERIZATION N/A 10/12/2015   Procedure: Right Heart Cath;  Surgeon: [name], MD;  Location: [facility] LAB;  Service: Cardiovascular;  Laterality: N/A;  . CARDIOVERSION  09/24/2012   Procedure: CARDIOVERSION;  Surgeon: [name], MD;  Location: [facility] ENDOSCOPY;  Service: Cardiovascular;  Laterality: N/A;  .  CORONARY ARTERY BYPASS GRAFT    . ICD    . TEE WITHOUT CARDIOVERSION  09/24/2012   Procedure: TRANSESOPHAGEAL ECHOCARDIOGRAM (TEE);  Surgeon: [name], MD;  Location[facility] ENDOSCOPY;  Service: Cardiovascular;  Laterality: N/A;  dave/anesth, dl, cindy/echo     reports that he quit smoking about 8 years ago. His smoking use included Pipe. He quit smokeless tobacco use about 8 years ago. He reports that he does not drink alcohol or use drugs. family history includes Arthritis in his mother; Heart attack in his mother; Heart disease in his mother; Other in his father. No Known Allergies Current Outpatient Prescriptions on File Prior to Visit  Medication Sig Dispense Refill  . acetaminophen (TYLENOL) 325 MG tablet Take 650 mg by mouth every 6 (six) hours as needed for mild pain.    [name] amiodarone (PACERONE) 200 MG tablet Take 1 tablet (200 mg total) by mouth daily. 30 tablet 3  . amoxicillin (AMOXIL) 500 MG capsule Take 1 capsule (500 mg total) by mouth 3 (three) times daily. 30 capsule 0  . Ascorbic Acid (VITAMIN C) 100 MG tablet Take 100 mg by mouth daily.    [name] BIDIL 20-37.5 MG tablet TAKE TWO TABLETS BY MOUTH THREE TIMES DAILY 180 tablet 6  . carvedilol (COREG) 3.125 MG tablet Take 1 tablet (3.125 mg total) by mouth 2 (two) times daily. 180 tablet 1  . furosemide (LASIX) 40 MG tablet Take 1  tablet (40 mg total) by mouth daily. 90 tablet 1  . isosorbide-hydrALAZINE (BIDIL) 20-37.5 MG tablet Take 2 tablets by mouth 3 (three) times daily. 180 tablet 3  . losartan (COZAAR) 25 MG tablet Take 1 tablet (25 mg total) by mouth daily. 90 tablet 3  . MULTIPLE VITAMIN PO Take by mouth daily.    . phenytoin (DILANTIN) 100 MG ER capsule Take 1 capsule (100 mg total) by mouth 2 (two) times daily. 180 capsule 3  . simvastatin (ZOCOR) 20 MG tablet Take 1 tablet (20 mg total) by mouth daily at 6 PM. 90 tablet 3  . spironolactone (ALDACTONE) 25 MG tablet Take 0.5 tablets (12.5 mg total) by mouth daily. 15 tablet  6  . triamcinolone cream (KENALOG) 0.1 % Apply 1 application topically 2 (two) times daily. 30 g 0  . warfarin (COUMADIN) 4 MG tablet Take 1 tablet (4 mg total) by mouth daily. 30 tablet 1   No current facility-administered medications on file prior to visit.    Review of Systems  Constitutional: Negative for unusual diaphoresis or night sweats HENT: Negative for ear swelling or discharge Eyes: Negative for worsening visual haziness  Respiratory: Negative for choking and stridor.   Gastrointestinal: Negative for distension or worsening eructation Genitourinary: Negative for retention or change in urine volume.  Musculoskeletal: Negative for other MSK pain or swelling Skin: Negative for color change and worsening wound Neurological: Negative for tremors and numbness other than noted  Psychiatric/Behavioral: Negative for decreased concentration or agitation other than above   All other system neg per pt    Objective:   Physical Exam BP 130/80   Pulse 82   Resp 20   Wt 224 lb (101.6 kg)   SpO2 94%   BMI 31.24 kg/m  VS noted,  Constitutional: Pt appears in no apparent distress HENT: Head: NCAT.  Right Ear: External ear normal.  Left Ear: External ear normal.  Eyes: . Pupils are equal, round, and reactive to light. Conjunctivae and EOM are normal + tongue with dorsal white rash Neck: Normal range of motion. Neck supple.  Cardiovascular: Normal rate and regular rhythm.   Pulmonary/Chest: Effort normal and breath sounds without rales or wheezing.  Abd:  Soft, NT, ND, + BS Neurological: Pt is alert. Not confused , motor grossly intact Skin: Skin is warm. no LE edema, but with nontender slightly raised itchy macular rash with excoriations primarily to right deltoid area about 5x5 cm, but several smaller areas more to distal arm as well. Psychiatric: Pt behavior is normal. No agitation.  No other new exam findings  INR 1.9 Dec 15    Assessment & Plan:

## 2016-12-15 DIAGNOSIS — B370 Candidal stomatitis: Secondary | ICD-10-CM | POA: Insufficient documentation

## 2016-12-15 NOTE — Assessment & Plan Note (Signed)
Likely small volume, on coumadin, etiology unclear, for cbc, refer GI,  to f/u any worsening symptoms or concerns

## 2016-12-15 NOTE — Assessment & Plan Note (Signed)
Mild to mod, for nystatin course,  to f/u any worsening symptoms or concerns 

## 2016-12-15 NOTE — Assessment & Plan Note (Signed)
C/w probable allergic type, ? Contact given the localized area, for depomedrol IM 80, predpac asd,  to f/u any worsening symptoms or concerns

## 2016-12-26 ENCOUNTER — Other Ambulatory Visit: Payer: Self-pay | Admitting: *Deleted

## 2016-12-26 MED ORDER — WARFARIN SODIUM 4 MG PO TABS: ORAL_TABLET | ORAL | 1 refills | Status: DC

## 2016-12-31 ENCOUNTER — Encounter: Payer: Self-pay | Admitting: Internal Medicine

## 2016-12-31 ENCOUNTER — Telehealth (HOSPITAL_COMMUNITY): Payer: Self-pay | Admitting: Vascular Surgery

## 2016-12-31 NOTE — Telephone Encounter (Signed)
Left pt to resch appt db will not be in office

## 2017-01-01 ENCOUNTER — Telehealth (HOSPITAL_COMMUNITY): Payer: Self-pay | Admitting: Vascular Surgery

## 2017-01-01 NOTE — Telephone Encounter (Signed)
Second attempt to reach pt.. Left pt message  stating that DB will not be in office 01/11/17, letting pt know appt will be canceled and to call back  To make anothe f/u appt w. DB

## 2017-01-04 ENCOUNTER — Ambulatory Visit (INDEPENDENT_AMBULATORY_CARE_PROVIDER_SITE_OTHER): Payer: Medicare Other | Admitting: *Deleted

## 2017-01-04 DIAGNOSIS — Z5181 Encounter for therapeutic drug level monitoring: Secondary | ICD-10-CM

## 2017-01-04 DIAGNOSIS — Z7901 Long term (current) use of anticoagulants: Secondary | ICD-10-CM | POA: Diagnosis not present

## 2017-01-04 DIAGNOSIS — I4891 Unspecified atrial fibrillation: Secondary | ICD-10-CM | POA: Diagnosis not present

## 2017-01-04 LAB — POCT INR: INR: 2.3

## 2017-01-07 ENCOUNTER — Ambulatory Visit (INDEPENDENT_AMBULATORY_CARE_PROVIDER_SITE_OTHER): Payer: Medicare Other | Admitting: Internal Medicine

## 2017-01-07 ENCOUNTER — Other Ambulatory Visit (INDEPENDENT_AMBULATORY_CARE_PROVIDER_SITE_OTHER): Payer: Medicare Other

## 2017-01-07 ENCOUNTER — Encounter: Payer: Self-pay | Admitting: Internal Medicine

## 2017-01-07 VITALS — BP 140/86 | HR 87 | Temp 98.1°F | Resp 16 | Ht 71.0 in | Wt 224.5 lb

## 2017-01-07 DIAGNOSIS — K921 Melena: Secondary | ICD-10-CM

## 2017-01-07 LAB — CBC
HEMATOCRIT: 46.8 % (ref 39.0–52.0)
HEMOGLOBIN: 15.3 g/dL (ref 13.0–17.0)
MCHC: 32.7 g/dL (ref 30.0–36.0)
MCV: 92.1 fl (ref 78.0–100.0)
PLATELETS: 113 10*3/uL — AB (ref 150.0–400.0)
RBC: 5.08 Mil/uL (ref 4.22–5.81)
RDW: 15.2 % (ref 11.5–15.5)
WBC: 4.1 10*3/uL (ref 4.0–10.5)

## 2017-01-07 MED ORDER — NYSTATIN 100000 UNIT/ML MT SUSP: OROMUCOSAL | 1 refills | Status: DC

## 2017-01-07 NOTE — Progress Notes (Signed)
   Subjective:    Patient ID: Louis Ford., male    DOB: 02-08-1946, 71 y.o.   MRN: [id number]  HPI The patient is a 71 YO man coming in for bloody stools again. Seen 3 times in the last 2 months for this same complaint and GI referral placed twice but he did not return their call to schedule. He does not remember getting any calls. For the last 2 weeks he is having blood with most bowel movements. Turning the toilet water red and blood with wiping multiple times. Denies diarrhea or constipation. No abdominal pain. No nausea and is still eating fine. Never colonoscopy in the past and we discussed the need for this at our last visit and he is agreeable. Denies SOB or fatigue or dizziness.   Review of Systems  Constitutional: Negative.   Respiratory: Negative.   Cardiovascular: Negative.   Gastrointestinal: Positive for anal bleeding and blood in stool. Negative for abdominal distention, abdominal pain, constipation, diarrhea, nausea and vomiting.  Musculoskeletal: Negative.   Skin: Negative.   Neurological: Negative for dizziness and light-headedness.      Objective:   Physical Exam  Constitutional: He is oriented to person, place, and time. He appears well-developed and well-nourished.  HENT:  Head: Normocephalic and atraumatic.  Eyes: EOM are normal.  Neck: Normal range of motion.  Cardiovascular: Normal rate and regular rhythm.   Pulmonary/Chest: Effort normal. No respiratory distress. He has no wheezes. He has no rales.  Abdominal: Soft. Bowel sounds are normal. He exhibits no distension and no mass. There is no tenderness. There is no rebound and no guarding.  Neurological: He is alert and oriented to person, place, and time. Coordination normal.  Skin: Skin is warm and dry.   Vitals:   01/07/17 1449  BP: 140/86  Pulse: 87  Resp: 16  Temp: 98.1 F (36.7 C)  TempSrc: Oral  SpO2: 100%  Weight: 224 lb 8 oz (101.8 kg)  Height: 5\' 11"  (1.803 m)      Assessment & Plan:

## 2017-01-07 NOTE — Patient Instructions (Signed)
We will try to get you a visit with GI and check the labs today.

## 2017-01-07 NOTE — Progress Notes (Signed)
Pre visit review using our clinic review tool, if applicable. No additional management support is needed unless otherwise documented below in the visit note. 

## 2017-01-08 NOTE — Assessment & Plan Note (Signed)
With ongoing symptoms for the last 2 weeks and also in December was having these symptoms. Still eating okay and weight stable. Checking CBC today for blood counts. GI visit made for him while he is here in the office so that he will have follow up with them. He needs colonoscopy since he has never had in the past.

## 2017-01-11 ENCOUNTER — Ambulatory Visit (INDEPENDENT_AMBULATORY_CARE_PROVIDER_SITE_OTHER): Payer: Medicare Other

## 2017-01-11 ENCOUNTER — Ambulatory Visit (HOSPITAL_COMMUNITY)
Admission: EM | Admit: 2017-01-11 | Discharge: 2017-01-11 | Disposition: A | Discharge status: Home / Self Care | Payer: Medicare Other | Attending: Family Medicine | Admitting: Family Medicine

## 2017-01-11 ENCOUNTER — Encounter (HOSPITAL_COMMUNITY): Payer: Self-pay | Admitting: *Deleted

## 2017-01-11 ENCOUNTER — Encounter (HOSPITAL_COMMUNITY): Payer: Medicare Other | Admitting: Internal Medicine

## 2017-01-11 DIAGNOSIS — S8002XA Contusion of left knee, initial encounter: Secondary | ICD-10-CM | POA: Diagnosis not present

## 2017-01-11 DIAGNOSIS — M25562 Pain in left knee: Secondary | ICD-10-CM

## 2017-01-11 DIAGNOSIS — W19XXXA Unspecified fall, initial encounter: Secondary | ICD-10-CM

## 2017-01-11 DIAGNOSIS — M7989 Other specified soft tissue disorders: Secondary | ICD-10-CM | POA: Diagnosis not present

## 2017-01-11 MED ORDER — HYDROCODONE-ACETAMINOPHEN 5-325 MG PO TABS: 2.0000 | ORAL_TABLET | ORAL | 0 refills | Status: DC | PRN

## 2017-01-11 NOTE — ED Triage Notes (Signed)
Patient reports slipping on ice today and injuring left knee, noticeable swelling to left knee. CMS intact distal to injury, unable to bear weight, limited ROM

## 2017-01-11 NOTE — ED Provider Notes (Signed)
CSN: [id number]     Arrival date & time 01/11/17  1446 History   First MD Initiated Contact with Patient 01/11/17 1635     Chief Complaint  Patient presents with  . Fall  . Knee Pain   (Consider location/radiation/quality/duration/timing/severity/associated sxs/prior Treatment) Patient slipped on ice today and landed on left knee.  He has pain swelling and difficulty walking on left knee.   The history is provided by the patient.  Fall  This is a new problem. The current episode started 3 to 5 hours ago. The problem occurs constantly. The problem has not changed since onset.Nothing aggravates the symptoms. Nothing relieves the symptoms. He has tried nothing for the symptoms.  Knee Pain    Past Medical History:  Diagnosis Date  . Atrial fibrillation   09/22/2012  . CAD (coronary artery disease)   . Chronic systolic heart failure ([place])   . 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 History of MRSA infection 05/2011  . HTN (hypertension)   . Hyperlipidemia   . implantable cardiac defibrillator-Biotronik    Device Implanted 2006; s/p gen change 03/2011 : bleeding persistent with pocket erosion and infection; explant and reimplant  06/2011  . Ischemic cardiomyopathy    EF 15 to 20% by TTE and TEE in 09/2012.  Severe LV dysfunction  . Obesity    BMI 31 in 09/2012  . Seizure disorder ([place]) latest 09/30/2012  . Seizures ([place])   . Stroke [facility])    Past Surgical History:  Procedure Laterality Date  . CARDIAC CATHETERIZATION N/A 10/12/2015   Procedure: Right Heart Cath;  Surgeon: [name], MD;  Location: [place] CV LAB;  Service: Cardiovascular;  Laterality: N/A;  . CARDIOVERSION  09/24/2012   Procedure: CARDIOVERSION;  Surgeon: [name], MD;  Location: [facility] ENDOSCOPY;  Service: Cardiovascular;  Laterality: N/A;  . CORONARY ARTERY BYPASS GRAFT    . ICD    . TEE WITHOUT CARDIOVERSION  09/24/2012   Procedure: TRANSESOPHAGEAL  ECHOCARDIOGRAM (TEE);  Surgeon: [name], MD;  Location: [facility] ENDOSCOPY;  Service: Cardiovascular;  Laterality: N/A;  dave/anesth, dl, cindy/echo    Family History  Problem Relation Age of Onset  . Arthritis Mother   . Heart disease Mother   . Heart attack Mother   . Other Father     smoker  . Hypertension Neg Hx     unknown  . Stroke Neg Hx     unknown   Social History  Substance Use Topics  . Smoking status: Former Smoker    Types: Pipe    Quit date: 09/21/2008  . Smokeless tobacco: Former Systems developer    Quit date: 09/21/2008  . Alcohol use No    Review of Systems  Constitutional: Negative.   HENT: Negative.   Eyes: Negative.   Respiratory: Negative.   Cardiovascular: Negative.   Gastrointestinal: Negative.   Endocrine: Negative.   Genitourinary: Negative.   Musculoskeletal: Positive for arthralgias.  Allergic/Immunologic: Negative.   Neurological: Negative.   Hematological: Negative.   Psychiatric/Behavioral: Negative.     Allergies  Patient has no known allergies.  Home Medications   Prior to Admission medications   Medication Sig Start Date End Date Taking? Authorizing Provider  acetaminophen (TYLENOL) 325 MG tablet Take 650 mg by mouth every 6 (six) hours as needed for mild pain.   Yes Historical Provider, MD  amiodarone (PACERONE) 200 MG tablet Take 1 tablet (200 mg total) by mouth daily. 08/24/16  Yes [name], MD  amoxicillin (AMOXIL) 500 MG capsule Take 1 capsule (500 mg total) by mouth 3 (three) times daily. 10/26/16  Yes [name], MD  Ascorbic Acid (VITAMIN C) 100 MG tablet Take 100 mg by mouth daily.   Yes Historical Provider, MD  BIDIL 20-37.5 MG tablet TAKE TWO TABLETS BY MOUTH THREE TIMES DAILY 11/19/16  Yes Satira Mccallum Tillery, PA-C  carvedilol (COREG) 3.125 MG tablet Take 1 tablet (3.125 mg total) by mouth 2 (two) times daily. 07/24/16  Yes [name], MD  furosemide (LASIX) 40 MG tablet Take 1 tablet (40 mg total) by mouth  daily. 07/24/16  Yes [name], MD  isosorbide-hydrALAZINE (BIDIL) 20-37.5 MG tablet Take 2 tablets by mouth 3 (three) times daily. 06/19/16  Yes Shirley Friar, PA-C  losartan (COZAAR) 25 MG tablet Take 1 tablet (25 mg total) by mouth daily. 03/13/16  Yes Shirley Friar, PA-C  MULTIPLE VITAMIN PO Take by mouth daily.   Yes Historical Provider, MD  nystatin (MYCOSTATIN) 100000 UNIT/ML suspension SWISH, GARGLE AND SPIT 5 TO 10 ML BY MOUTH EVERY 6 HOURS AS NEEDED 01/07/17  Yes [name], MD  phenytoin (DILANTIN) 100 MG ER capsule Take 1 capsule (100 mg total) by mouth 2 (two) times daily. 08/24/16  Yes [name], MD  predniSONE (DELTASONE) 10 MG tablet 2 tabs by mouth per day for 5 days 12/13/16  Yes Biagio Borg, MD  simvastatin (ZOCOR) 20 MG tablet Take 1 tablet (20 mg total) by mouth daily at 6 PM. 04/02/16  Yes [name], MD  spironolactone (ALDACTONE) 25 MG tablet Take 0.5 tablets (12.5 mg total) by mouth daily. 09/11/16  Yes [name] [name], MD  triamcinolone cream (KENALOG) 0.1 % Apply 1 application topically 2 (two) times daily. 08/16/16  Yes [name], MD  warfarin (COUMADIN) 4 MG tablet Take as directed by Coumadin Clinic 12/26/16  Yes [name], MD  HYDROcodone-acetaminophen (NORCO/VICODIN) 5-325 MG tablet Take 2 tablets by mouth every 4 (four) hours as needed. 01/11/17   Lysbeth Penner, FNP   Meds Ordered and Administered this Visit  Medications - No data to display  There were no vitals taken for this visit. No data found.   Physical Exam  Constitutional: He appears well-developed and well-nourished.  HENT:  Head: Normocephalic and atraumatic.  Right Ear: External ear normal.  Left Ear: External ear normal.  Mouth/Throat: Oropharynx is clear and moist.  Eyes: Conjunctivae and EOM are normal. Pupils are equal, round, and reactive to light.  Neck: Normal range of motion. Neck supple.  Cardiovascular: Normal rate,  regular rhythm and normal heart sounds.   Pulmonary/Chest: Effort normal and breath sounds normal.  Abdominal: Soft. Bowel sounds are normal.  Musculoskeletal: He exhibits tenderness.  TTP left knee and patella.  Swelling left knee.  Nursing note and vitals reviewed.   Urgent Care Course     Procedures (including critical care time)  Labs Review Labs Reviewed - No data to display  Imaging Review Dg Knee Complete 4 Views Left  Result Date: 01/11/2017 CLINICAL DATA:  [place] yesterday onto the ice onto concrete twisting LEFT leg internally, LEFT knee pain radiating at medial upper leg, LEFT knee pain and swelling EXAM: LEFT KNEE - COMPLETE 4+ VIEW COMPARISON:  02/28/2016 FINDINGS: Osseous demineralization. Scattered joint space narrowing and spur formation greatest at patellofemoral joint. No acute fracture, dislocation or bone destruction. Large knee joint effusion. No acute fracture,  dislocation, or bone destruction. Atherosclerotic calcifications of popliteal artery into trifurcation. IMPRESSION: Degenerative changes and osseous demineralization LEFT knee. No acute abnormalities. Electronically Signed   By: Lavonia Dana M.D.   On: 01/11/2017 17:36     Visual Acuity Review  Right Eye Distance:   Left Eye Distance:   Bilateral Distance:    Right Eye Near:   Left Eye Near:    Bilateral Near:         MDM   1. Fall, initial encounter   2. Traumatic hematoma of left knee, initial encounter   3. Acute pain of left knee    [place] 5/325 #6 [place], FNP 01/11/17 1754

## 2017-01-14 ENCOUNTER — Ambulatory Visit: Payer: Medicare Other | Admitting: Physician Assistant

## 2017-01-22 ENCOUNTER — Ambulatory Visit (INDEPENDENT_AMBULATORY_CARE_PROVIDER_SITE_OTHER): Payer: Medicare Other | Admitting: Internal Medicine

## 2017-01-22 ENCOUNTER — Encounter: Payer: Self-pay | Admitting: Internal Medicine

## 2017-01-22 VITALS — BP 120/70 | HR 50 | Temp 97.9°F | Resp 16 | Ht 71.0 in | Wt 215.0 lb

## 2017-01-22 DIAGNOSIS — G8929 Other chronic pain: Secondary | ICD-10-CM | POA: Insufficient documentation

## 2017-01-22 DIAGNOSIS — M25562 Pain in left knee: Secondary | ICD-10-CM | POA: Diagnosis not present

## 2017-01-22 MED ORDER — METHYLPREDNISOLONE ACETATE 40 MG/ML IJ SUSP
40.0000 mg | Freq: Once | INTRAMUSCULAR | Status: AC
  Administered 2017-01-22: 40 mg via INTRAMUSCULAR

## 2017-01-22 MED ORDER — TRAMADOL HCL 50 MG PO TABS: 50.0000 mg | ORAL_TABLET | Freq: Three times a day (TID) | ORAL | 0 refills | Status: DC | PRN

## 2017-01-22 NOTE — Progress Notes (Signed)
Pre visit review using our clinic review tool, if applicable. No additional management support is needed unless otherwise documented below in the visit note. 

## 2017-01-22 NOTE — Assessment & Plan Note (Signed)
Referral to orthopedics, ACL and PCL intact on exam but some swelling. Given depo-medrol 40 mg IM at visit. Tramadol rx for pain.

## 2017-01-22 NOTE — Patient Instructions (Signed)
We have given you a steroid shot today which should help.   We have sent in a tramadol prescription that you can use for pain.  We are also getting a visit with the orthopedic doctor in case this does not improve.

## 2017-01-22 NOTE — Progress Notes (Signed)
   Subjective:    Patient ID: Louis Ford., male    DOB: Jan 16, 1946, 71 y.o.   MRN: [id number]  HPI The patient is a 71 YO man coming in for left knee pain. He was out shoveling snow last week and fell in the snow. He went to urgent care afterwards and they did an x-ray and told him it was just bruised. He started having swelling the day after he fell. He was able to walk on it right afterwards. Now he is using a walker to get around at home. No improvement in the last week. It is still swollen. He has given hydrocodone for the pain but this just made him sleep but he took it. He is taking tylenol for the pain which helps a little bit. He is worried about the lack of improvement. This is impacting the ADLs.   Review of Systems  Constitutional: Positive for activity change. Negative for appetite change, chills, fatigue, fever and unexpected weight change.  Respiratory: Negative.   Cardiovascular: Negative.   Gastrointestinal: Negative.   Musculoskeletal: Positive for arthralgias, gait problem and joint swelling. Negative for back pain, myalgias, neck pain and neck stiffness.  Skin: Negative.       Objective:   Physical Exam  Constitutional: He appears well-developed and well-nourished.  HENT:  Head: Normocephalic and atraumatic.  Eyes: EOM are normal.  Neck: Normal range of motion.  Cardiovascular: Normal rate and regular rhythm.   Pulmonary/Chest: Effort normal and breath sounds normal.  Abdominal: Soft.  Musculoskeletal: He exhibits tenderness.  Some swelling in the left knee, pain diffuse with some on the medial aspect and lateral aspect. Pain over the patella as well. ACL intact.   Neurological: Coordination abnormal.  Skin: Skin is warm and dry.   Vitals:   01/22/17 1352  BP: 120/70  Pulse: (!) 50  Resp: 16  Temp: 97.9 F (36.6 C)  TempSrc: Oral  Weight: 215 lb (97.5 kg)  Height: 5\' 11"  (1.803 m)      Assessment & Plan:  Depo-medrol 40 mg IM given at visit.

## 2017-01-23 ENCOUNTER — Ambulatory Visit (INDEPENDENT_AMBULATORY_CARE_PROVIDER_SITE_OTHER): Payer: Medicare Other | Admitting: *Deleted

## 2017-01-23 DIAGNOSIS — I255 Ischemic cardiomyopathy: Secondary | ICD-10-CM | POA: Diagnosis not present

## 2017-01-23 NOTE — Progress Notes (Signed)
Remote ICD transmission.   

## 2017-01-24 ENCOUNTER — Encounter: Payer: Self-pay | Admitting: Cardiology

## 2017-01-25 ENCOUNTER — Other Ambulatory Visit: Payer: Self-pay | Admitting: Internal Medicine

## 2017-01-25 ENCOUNTER — Other Ambulatory Visit (HOSPITAL_COMMUNITY): Payer: Self-pay | Admitting: Internal Medicine

## 2017-01-28 ENCOUNTER — Ambulatory Visit (INDEPENDENT_AMBULATORY_CARE_PROVIDER_SITE_OTHER): Payer: Medicare Other | Admitting: Physician Assistant

## 2017-01-28 ENCOUNTER — Telehealth: Payer: Self-pay | Admitting: Emergency Medicine

## 2017-01-28 ENCOUNTER — Encounter: Payer: Self-pay | Admitting: Physician Assistant

## 2017-01-28 VITALS — BP 110/70 | HR 90 | Ht 71.0 in | Wt 215.0 lb

## 2017-01-28 DIAGNOSIS — Z1212 Encounter for screening for malignant neoplasm of rectum: Secondary | ICD-10-CM

## 2017-01-28 DIAGNOSIS — Z1211 Encounter for screening for malignant neoplasm of colon: Secondary | ICD-10-CM | POA: Diagnosis not present

## 2017-01-28 DIAGNOSIS — K625 Hemorrhage of anus and rectum: Secondary | ICD-10-CM

## 2017-01-28 NOTE — Telephone Encounter (Signed)
01/28/2017   RE: [facility]. DOB: 05-May-1946 MRN: [id number]   Dear Dr. Lovena Le,    We have scheduled the above patient for an endoscopic procedure. Our records show that he is on anticoagulation therapy.   Please advise as to how long the patient may come off his therapy of Coumadin prior to the procedure, which is scheduled for 02-13-17.  Please fax back/ or route the completed form to Tinnie Gens, [place].   Sincerely,    Tinnie Gens, [place]

## 2017-01-28 NOTE — Progress Notes (Signed)
Chief Complaint: Rectal Bleeding  HPI:  Louis Ford is a 71 year old African-American male with a past medical history of CAD maintained on Coumadin (last echo with LV EF 02/27/16 0000000), chronic systolic heart failure, factor VII deficiency, hypertension, hyperlipidemia, cardiac defibrillator in 2006 as well as seizure disorder and stroke, who was referred to me by [name], * for a complaint of rectal bleeding.    Recent labs completed 01/07/17 show a CBC with platelets minimally decreased at 113 and is otherwise normal.    Today, the patient presents to clinic accompanied by his wife who does assist with his history. He tells me that for the past 3 weeks he has been seeing various amounts of bright red blood in his stool. Over the past 4 days he had not seen any until again this morning he saw someone wiping on the toilet paper. Patient tells me that sometimes this will "fill the whole toilet", other times this is just scant on the toilet paper. Patient denies any rectal pain or itching/irritation. He does remind me that he is on Coumadin so "I bleed easy". The patient denies ever having a screening colonoscopy in the past.   Patient denies fever, chills, melena, change in bowel habits, weight loss, fatigue, anorexia, nausea, vomiting, heartburn, reflux, abdominal pain, shortness of breath or dizziness.  Past Medical History:  Diagnosis Date  . Atrial fibrillation   09/22/2012  . CAD (coronary artery disease)   . Chronic systolic heart failure ([place])   . 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 History of MRSA infection 05/2011  . HTN (hypertension)   . Hyperlipidemia   . implantable cardiac defibrillator-Biotronik    Device Implanted 2006; s/p gen change 03/2011 : bleeding persistent with pocket erosion and infection; explant and reimplant  06/2011  . Ischemic cardiomyopathy    EF 15 to 20% by TTE and TEE in 09/2012.  Severe LV dysfunction    . Obesity    BMI 31 in 09/2012  . Seizure disorder ([place]) latest 09/30/2012  . Seizures ([place])   . Stroke [facility])     Past Surgical History:  Procedure Laterality Date  . CARDIAC CATHETERIZATION N/A 10/12/2015   Procedure: Right Heart Cath;  Surgeon: [name], MD;  Location: [place] CV LAB;  Service: Cardiovascular;  Laterality: N/A;  . CARDIOVERSION  09/24/2012   Procedure: CARDIOVERSION;  Surgeon: [name], MD;  Location: [facility] ENDOSCOPY;  Service: Cardiovascular;  Laterality: N/A;  . CORONARY ARTERY BYPASS GRAFT    . ICD    . TEE WITHOUT CARDIOVERSION  09/24/2012   Procedure: TRANSESOPHAGEAL ECHOCARDIOGRAM (TEE);  Surgeon: [name], MD;  Location: [place];  Service: Cardiovascular;  Laterality: N/A;  dave/anesth, dl, cindy/echo     Current Outpatient Prescriptions  Medication Sig Dispense Refill  . acetaminophen (TYLENOL) 325 MG tablet Take 650 mg by mouth every 6 (six) hours as needed for mild pain.    [name] amiodarone (PACERONE) 200 MG tablet Take 1 tablet (200 mg total) by mouth daily. 30 tablet 3  . amiodarone (PACERONE) 200 MG tablet TAKE ONE TABLET BY MOUTH ONCE DAILY 30 tablet 6  . amoxicillin (AMOXIL) 500 MG capsule Take 1 capsule (500 mg total) by mouth 3 (three) times daily. 30 capsule 0  . Ascorbic Acid (VITAMIN C) 100 MG tablet Take 100 mg by mouth daily.    [name] BIDIL 20-37.5 MG tablet TAKE TWO TABLETS BY MOUTH THREE TIMES  DAILY 180 tablet 6  . carvedilol (COREG) 3.125 MG tablet Take 1 tablet (3.125 mg total) by mouth 2 (two) times daily. 180 tablet 1  . furosemide (LASIX) 40 MG tablet Take 1 tablet (40 mg total) by mouth daily. 90 tablet 1  . HYDROcodone-acetaminophen (NORCO/VICODIN) 5-325 MG tablet Take 2 tablets by mouth every 4 (four) hours as needed. 6 tablet 0  . isosorbide-hydrALAZINE (BIDIL) 20-37.5 MG tablet Take 2 tablets by mouth 3 (three) times daily. 180 tablet 3  . losartan (COZAAR) 25 MG tablet Take 1 tablet (25 mg total) by mouth  daily. 90 tablet 3  . MULTIPLE VITAMIN PO Take by mouth daily.    [name] nystatin (MYCOSTATIN) 100000 UNIT/ML suspension SWISH, GARGLE AND SPIT 5 TO 10 ML BY MOUTH EVERY 6 HOURS AS NEEDED 240 mL 1  . phenytoin (DILANTIN) 100 MG ER capsule Take 1 capsule (100 mg total) by mouth 2 (two) times daily. 180 capsule 3  . predniSONE (DELTASONE) 10 MG tablet 2 tabs by mouth per day for 5 days 10 tablet 0  . simvastatin (ZOCOR) 20 MG tablet Take 1 tablet (20 mg total) by mouth daily at 6 PM. 90 tablet 3  . spironolactone (ALDACTONE) 25 MG tablet Take 0.5 tablets (12.5 mg total) by mouth daily. 15 tablet 6  . traMADol (ULTRAM) 50 MG tablet Take 1 tablet (50 mg total) by mouth every 8 (eight) hours as needed. 30 tablet 0  . triamcinolone cream (KENALOG) 0.1 % Apply 1 application topically 2 (two) times daily. 30 g 0  . warfarin (COUMADIN) 4 MG tablet Take as directed by Coumadin Clinic 30 tablet 1   No current facility-administered medications for this visit.     Allergies as of 01/28/2017  . (No Known Allergies)    Family History  Problem Relation Age of Onset  . Arthritis Mother   . Heart disease Mother   . Heart attack Mother   . Other Father     smoker  . Hypertension Neg Hx     unknown  . Stroke Neg Hx     unknown    Social History   Social History  . Marital status: Single    Spouse name: N/A  . Number of children: N/A  . Years of education: N/A   Occupational History  . Not on file.   Social History Main Topics  . Smoking status: Former Smoker    Types: Pipe    Quit date: 09/21/2008  . Smokeless tobacco: Former Systems developer    Quit date: 09/21/2008  . Alcohol use No  . Drug use: No  . Sexual activity: No   Other Topics Concern  . Not on file   Social History Narrative   ** Merged History Encounter **        Review of Systems:    Constitutional: No weight loss, fever, chills, weakness or fatigue Skin: No rash or itching Cardiovascular: No chest pain Respiratory: No SOB    Gastrointestinal: See HPI and otherwise negative Genitourinary: No dysuria or change in urinary frequency Neurological: No headache, dizziness or syncope Musculoskeletal: No new muscle or joint pain Hematologic: No bruising Psychiatric: No history of depression or anxiety   Physical Exam:  Vital signs: BP 110/70   Pulse 90   Ht 5\' 11"  (1.803 m)   Wt 215 lb (97.5 kg)   SpO2 91%   BMI 29.99 kg/m   Constitutional:   Pleasant African American male appears to be in NAD, Well developed,  Well nourished, alert and cooperative, in wheelchair he says due to recent left knee injury Head:  Normocephalic and atraumatic. Eyes:   PEERL, EOMI. No icterus. Conjunctiva pink. Ears:  Normal auditory acuity. Neck:  Supple Throat: Oral cavity and pharynx without inflammation, swelling or lesion.  Respiratory: Respirations even and unlabored. Lungs clear to auscultation bilaterally.   No wheezes, crackles, or rhonchi.  Cardiovascular: Normal S1, S2. No MRG. Regular rate and rhythm. No peripheral edema, cyanosis or pallor.  Gastrointestinal:  Soft, nondistended, nontender. No rebound or guarding. Normal bowel sounds. No appreciable masses or hepatomegaly. Rectal:  Not performed.  Msk:  Symmetrical without gross deformities. Without edema, no deformity or joint abnormality.  Neurologic:  Alert and  oriented x4;  grossly normal neurologically.  Skin:   Dry and intact without significant lesions or rashes. Psychiatric: Demonstrates good judgement and reason without abnormal affect or behaviors.  RELEVANT LABS AND IMAGING: CBC    Component Value Date/Time   WBC 4.1 01/07/2017 1524   RBC 5.08 01/07/2017 1524   HGB 15.3 01/07/2017 1524   HCT 46.8 01/07/2017 1524   PLT 113.0 (L) 01/07/2017 1524   MCV 92.1 01/07/2017 1524   MCH 29.8 10/25/2016 0914   MCHC 32.7 01/07/2017 1524   RDW 15.2 01/07/2017 1524   LYMPHSABS 1.0 12/13/2016 1546   MONOABS 0.8 12/13/2016 1546   EOSABS 0.3 12/13/2016 1546    BASOSABS 0.0 12/13/2016 1546    CMP     Component Value Date/Time   NA 137 12/13/2016 1546   K 5.2 (H) 12/13/2016 1546   CL 103 12/13/2016 1546   CO2 31 12/13/2016 1546   GLUCOSE 77 12/13/2016 1546   BUN 23 12/13/2016 1546   CREATININE 1.24 12/13/2016 1546   CALCIUM 9.1 12/13/2016 1546   PROT 7.8 12/13/2016 1546   ALBUMIN 4.0 12/13/2016 1546   AST 20 12/13/2016 1546   ALT 15 12/13/2016 1546   ALKPHOS 102 12/13/2016 1546   BILITOT 0.4 12/13/2016 1546   GFRNONAA >60 11/08/2016 1059   GFRAA >60 11/08/2016 1059    Assessment: 1. Rectal bleeding: Constantly for 2-1/2 weeks, then intermittently over the past half week, sometimes quite a lot of blood" filling the toilet", others "scant on the tissue paper", no change in bowel habits over this time period, patient is on Coumadin due to CAD; consider hemorrhoids versus mass versus other 2. Screening colonoscopy: Patient overdue for screening colonoscopy  Plan: 1. Scheduled patient for a screening colonoscopy in the [place] with Dr. Henrene Pastor, discussed risks, benefits, limitations and alternatives and the patient agrees to proceed. 2. Advised the patient to hold his Coumadin for 5 days prior to his colonoscopy. We will discuss holding his Coumadin with his cardiologist Dr. Lovena Le to ensure that this is acceptable. 3. Patient to follow in clinic per Dr. Blanch Media recommendations after time of procedure  Louis Newer, PA-C [facility] 01/28/2017, 3:31 PM  Cc: [name], *

## 2017-01-28 NOTE — Telephone Encounter (Signed)
Ok to hold Xarelto for 2 days prior to procedure. Restart when ok with MD from a bleeding perspective. GT

## 2017-01-28 NOTE — Patient Instructions (Signed)

## 2017-01-29 NOTE — Progress Notes (Signed)
Inadvertently sent to me. I was not supervising. Will send back to [facility] [address]

## 2017-01-29 NOTE — Telephone Encounter (Signed)
Pt is on Coumadin not Xarelto. Takes it for afib with hx of HTN, CHF, stroke, and factor VII deficiency. He will need a Lovenox bridge to hold Coumadin for 5 days prior to procedure. Will coordinate in Coumadin clinic at pt's next visit with [place] on 2/12 prior to endoscopy on 2/21.

## 2017-01-29 NOTE — Telephone Encounter (Signed)
Thank you Megan.

## 2017-01-30 ENCOUNTER — Encounter: Payer: Self-pay | Admitting: Internal Medicine

## 2017-01-30 NOTE — Progress Notes (Signed)
The pt is on Dr Celesta Aver schedule and is aware of the anti coag instructions.

## 2017-02-01 ENCOUNTER — Telehealth (HOSPITAL_COMMUNITY): Payer: Self-pay | Admitting: Vascular Surgery

## 2017-02-01 NOTE — Telephone Encounter (Signed)
Left pt message to move pt appt up earlier

## 2017-02-04 ENCOUNTER — Ambulatory Visit (INDEPENDENT_AMBULATORY_CARE_PROVIDER_SITE_OTHER): Payer: Medicare Other

## 2017-02-04 ENCOUNTER — Other Ambulatory Visit (HOSPITAL_COMMUNITY): Payer: Self-pay | Admitting: Cardiology

## 2017-02-04 ENCOUNTER — Encounter (HOSPITAL_COMMUNITY): Payer: Medicare Other | Admitting: Internal Medicine

## 2017-02-04 DIAGNOSIS — Z7901 Long term (current) use of anticoagulants: Secondary | ICD-10-CM

## 2017-02-04 DIAGNOSIS — Z5181 Encounter for therapeutic drug level monitoring: Secondary | ICD-10-CM | POA: Diagnosis not present

## 2017-02-04 DIAGNOSIS — I4891 Unspecified atrial fibrillation: Secondary | ICD-10-CM

## 2017-02-04 LAB — POCT INR: INR: 1.6

## 2017-02-04 MED ORDER — ENOXAPARIN SODIUM 100 MG/ML ~~LOC~~ SOLN: 100.0000 mg | Freq: Two times a day (BID) | SUBCUTANEOUS | 1 refills | Status: DC

## 2017-02-04 MED ORDER — CARVEDILOL 3.125 MG PO TABS: 3.1250 mg | ORAL_TABLET | Freq: Two times a day (BID) | ORAL | 3 refills | Status: DC

## 2017-02-04 NOTE — Patient Instructions (Signed)
02/07/17: Last dose of Coumadin.  02/08/17: No Coumadin or Lovenox.  02/09/17: Inject Lovenox 100 mg in the fatty abdominal tissue at least 2 inches from the belly button twice a day about 12 hours apart, 8am and 8pm rotate sites. No Coumadin.  02/10/17: Inject Lovenox in the fatty tissue every 12 hours, 8am and 8pm. No Coumadin.  02/11/17: Inject Lovenox in the fatty tissue every 12 hours, 8am and 8pm. No Coumadin.  02/12/17: Inject Lovenox in the fatty tissue in the morning at 8 am (No PM dose). No Coumadin.  02/13/17: Procedure Day - No Lovenox - Resume Coumadin in the evening or as directed by doctor (take an extra half tablet with usual dose for 2 days then resume normal dose).  02/14/17: Resume Lovenox inject in the fatty tissue every 12 hours and take Coumadin.  02/15/17: Inject Lovenox in the fatty tissue every 12 hours and take Coumadin.  02/16/17: Inject Lovenox in the fatty tissue every 12 hours and take Coumadin.  02/17/17: Inject Lovenox in the fatty tissue every 12 hours and take Coumadin.  02/18/17: Inject Lovenox in the fatty tissue every 12 hours and take Coumadin.  02/19/17: Coumadin appt to check INR.

## 2017-02-05 LAB — CUP PACEART REMOTE DEVICE CHECK
Brady Statistic RV Percent Paced: 0 %
Date Time Interrogation Session: 20180213085507
HighPow Impedance: 63 Ohm
Implantable Lead Implant Date: 20060927
Implantable Lead Serial Number: 10260064
Implantable Pulse Generator Implant Date: 20120413
Lead Channel Pacing Threshold Amplitude: 0.5 V
Lead Channel Sensing Intrinsic Amplitude: 12 mV
MDC IDC LEAD LOCATION: 753860
MDC IDC MSMT LEADCHNL RV IMPEDANCE VALUE: 482 Ohm
MDC IDC MSMT LEADCHNL RV PACING THRESHOLD PULSEWIDTH: 0.4 ms
MDC IDC PG SERIAL: 60613417

## 2017-02-08 ENCOUNTER — Telehealth: Payer: Self-pay | Admitting: Internal Medicine

## 2017-02-08 DIAGNOSIS — M1712 Unilateral primary osteoarthritis, left knee: Secondary | ICD-10-CM | POA: Diagnosis not present

## 2017-02-08 DIAGNOSIS — M25562 Pain in left knee: Secondary | ICD-10-CM | POA: Diagnosis not present

## 2017-02-08 DIAGNOSIS — M25462 Effusion, left knee: Secondary | ICD-10-CM | POA: Diagnosis not present

## 2017-02-08 NOTE — Telephone Encounter (Signed)
Follow Up:    Please call,pt says Renee had told him what to do about his Warfarin,but he forgot.He is going to have a Colonoscopy next week.

## 2017-02-08 NOTE — Telephone Encounter (Signed)
New Message     He received the message of when he is to stop the coumadin, thank you

## 2017-02-08 NOTE — Telephone Encounter (Signed)
Pt has questions about his coumadin and his colonoscopy next week

## 2017-02-08 NOTE — Telephone Encounter (Signed)
Pt di not want nurse to call him back,wanted to talk her now. Dr Lovena Le regular nurse nurse is not here today,I was going to let the triage nurse call him back. I asked him several time what was going on and we would be glad to help him.Pt refused and hung up.

## 2017-02-08 NOTE — Telephone Encounter (Signed)
I will forward to CVRR 

## 2017-02-13 ENCOUNTER — Encounter: Payer: Self-pay | Admitting: Internal Medicine

## 2017-02-13 ENCOUNTER — Ambulatory Visit (AMBULATORY_SURGERY_CENTER): Payer: Medicare Other | Admitting: Internal Medicine

## 2017-02-13 VITALS — BP 120/78 | HR 63 | Temp 97.7°F | Resp 15 | Ht 71.0 in | Wt 215.0 lb

## 2017-02-13 DIAGNOSIS — D123 Benign neoplasm of transverse colon: Secondary | ICD-10-CM

## 2017-02-13 DIAGNOSIS — N186 End stage renal disease: Secondary | ICD-10-CM | POA: Diagnosis not present

## 2017-02-13 DIAGNOSIS — K625 Hemorrhage of anus and rectum: Secondary | ICD-10-CM | POA: Diagnosis present

## 2017-02-13 DIAGNOSIS — D125 Benign neoplasm of sigmoid colon: Secondary | ICD-10-CM

## 2017-02-13 DIAGNOSIS — K921 Melena: Secondary | ICD-10-CM | POA: Diagnosis not present

## 2017-02-13 HISTORY — PX: COLONOSCOPY W/ POLYPECTOMY: SHX1380

## 2017-02-13 MED ORDER — SODIUM CHLORIDE 0.9 % IV SOLN: 500.0000 mL | INTRAVENOUS | Status: DC

## 2017-02-13 NOTE — Progress Notes (Signed)
Pt is in a. Fib with multifocal PVC's.  Barrie Folk, CRNA printed strip from the procedure and I printed strip from the recovery period.  maw

## 2017-02-13 NOTE — Patient Instructions (Addendum)
I found and removed 3 polyps - one was very large and causing the bleeding.  You also have a condition called diverticulosis - common and not usually a problem. Please read the handout provided.  I will let you know pathology results and when/if to have another routine colonoscopy by mail.  I appreciate the opportunity to care for you. [name], MD, [facility]   Please take the blood thinners as you were instructed.   02/14/17: Resume Lovenox inject in the fatty tissue every 12 hours and take Coumadin.  02/15/17: Inject Lovenox in the fatty tissue every 12 hours and take Coumadin.  02/16/17: Inject Lovenox in the fatty tissue every 12 hours and take Coumadin.  02/17/17: Inject Lovenox in the fatty tissue every 12 hours and take Coumadin.  02/18/17: Inject Lovenox in the fatty tissue every 12 hours and take Coumadin.  02/19/17: Coumadin appt to check INR.     YOU HAD AN ENDOSCOPIC PROCEDURE TODAY AT [facility]:   Refer to the procedure report that was given to you for any specific questions about what was found during the examination.  If the procedure report does not answer your questions, please call your gastroenterologist to clarify.  If you requested that your care partner not be given the details of your procedure findings, then the procedure report has been included in a sealed envelope for you to review at your convenience later.  YOU SHOULD EXPECT: Some feelings of bloating in the abdomen. Passage of more gas than usual.  Walking can help get rid of the air that was put into your GI tract during the procedure and reduce the bloating. If you had a lower endoscopy (such as a colonoscopy or flexible sigmoidoscopy) you may notice spotting of blood in your stool or on the toilet paper. If you underwent a bowel prep for your procedure, you may not have a normal bowel movement for a few days.  Please Note:  You might notice some irritation and congestion in  your nose or some drainage.  This is from the oxygen used during your procedure.  There is no need for concern and it should clear up in a day or so.  SYMPTOMS TO REPORT IMMEDIATELY:   Following lower endoscopy (colonoscopy or flexible sigmoidoscopy):  Excessive amounts of blood in the stool  Significant tenderness or worsening of abdominal pains  Swelling of the abdomen that is new, acute  Fever of 100F or higher   For urgent or emergent issues, a gastroenterologist can be reached at any hour by calling [phone].   DIET:  We do recommend a small meal at first, but then you may proceed to your regular diet.  Drink plenty of fluids but you should avoid alcoholic beverages for 24 hours.  ACTIVITY:  You should plan to take it easy for the rest of today and you should NOT DRIVE or use heavy machinery until tomorrow (because of the sedation medicines used during the test).    FOLLOW UP: Our staff will call the number listed on your records the next business day following your procedure to check on you and address any questions or concerns that you may have regarding the information given to you following your procedure. If we do not reach you, we will leave a message.  However, if you are feeling well and you are not experiencing any problems, there is no need to return our call.  We will assume that you have returned  to your regular daily activities without incident.  If any biopsies were taken you will be contacted by phone or by letter within the next 1-3 weeks.  Please call us at [phone] if you have not heard about the biopsies in 3 weeks.    SIGNATURES/CONFIDENTIALITY: You and/or your care partner have signed paperwork which will be entered into your electronic medical record.  These signatures attest to the fact that that the information above on your After Visit Summary has been reviewed and is understood.  Full responsibility of the confidentiality of this discharge  information lies with you and/or your care-partner.    Handouts were given to your care partner on polyps and diverticulosis. You may resume your current medications today.  Follow the lovenox & Coumadin directions listed above. Await biopsy results. Please call if any questions or concerns.

## 2017-02-13 NOTE — Progress Notes (Signed)
Called to room to assist during endoscopic procedure.  Patient ID and intended procedure confirmed with present staff. Received instructions for my participation in the procedure from the performing physician.  

## 2017-02-13 NOTE — Progress Notes (Signed)
Pt's wife showed me her schedule for lovenox and coumadin.  I show the schedule to Dr. Carlean Purl.  He said for pt to hold lovonox & coumadin tonight.  The restart tomorrow 02-14-17 following the schedule.  Pt and his wife understand the instructions. maw

## 2017-02-13 NOTE — Op Note (Addendum)
[place] Patient Name: [facility] Procedure Date: 02/13/2017 3:02 PM MRN: [id number] Endoscopist: [name] , MD Age: 71 Referring MD:  Date of Birth: April 09, 1946 Gender: Male Account #: [id number] Procedure:                Colonoscopy Indications:              Rectal bleeding Medicines:                Propofol per Anesthesia, Monitored Anesthesia Care Procedure:                Pre-Anesthesia Assessment:                           - Prior to the procedure, a History and Physical                            was performed, and patient medications and                            allergies were reviewed. The patient's tolerance of                            previous anesthesia was also reviewed. The risks                            and benefits of the procedure and the sedation                            options and risks were discussed with the patient.                            All questions were answered, and informed consent                            was obtained. Prior Anticoagulants: The patient                            last took Coumadin (warfarin) 5 days and Lovenox                            (enoxaparin) 1 day prior to the procedure. ASA                            Grade Assessment: III - A patient with severe                            systemic disease. After reviewing the risks and                            benefits, the patient was deemed in satisfactory                            condition to undergo the procedure.  After obtaining informed consent, the colonoscope                            was passed under direct vision. Throughout the                            procedure, the patient's blood pressure, pulse, and                            oxygen saturations were monitored continuously. The                            Model CF-HQ190L [id number]) scope was introduced                            through the anus and advanced to the  the cecum,                            identified by appendiceal orifice and ileocecal                            valve. The ileocecal valve, appendiceal orifice,                            and rectum were photographed. The quality of the                            bowel preparation was good. The bowel preparation                            used was Miralax. The patient tolerated the                            procedure well. The colonoscopy was performed                            without difficulty. Scope In: 3:07:30 PM Scope Out: 3:33:59 PM Scope Withdrawal Time: 0 hours 17 minutes 37 seconds  Total Procedure Duration: 0 hours 26 minutes 29 seconds  Findings:                 The perianal and digital rectal examinations were                            normal. Pertinent negatives include normal prostate                            (size, shape, and consistency).                           A 30 mm polyp was found in the sigmoid colon. The                            polyp was pedunculated. The polyp was removed with  a hot snare. Resection and retrieval were complete.                            Area was successfully injected with 5 mL of a                            1:10,000 solution of epinephrine for bleeding                            prevention. Estimated blood loss: none.                           A diminutive polyp was found in the transverse                            colon. The polyp was sessile. The polyp was removed                            with a cold snare. Resection and retrieval were                            complete. Verification of patient identification                            for the specimen was done. Estimated blood loss was                            minimal.                           A diminutive polyp was found in the transverse                            colon. The polyp was sessile. The polyp was removed                             with a cold biopsy forceps. Resection and retrieval                            were complete. Verification of patient                            identification for the specimen was done. Estimated                            blood loss was minimal.                           Many small and large-mouthed diverticula were found                            in the sigmoid colon. There was narrowing of the  colon in association with the diverticular opening.                           The exam was otherwise without abnormality on                            direct and retroflexion views. Complications:            No immediate complications. Estimated Blood Loss:     Estimated blood loss was minimal. Impression:               - One 30 mm polyp in the sigmoid colon, removed                            with a hot snare. Resected and retrieved. Injected.                           - One diminutive polyp in the transverse colon,                            removed with a cold snare. Resected and retrieved.                           - One diminutive polyp in the transverse colon,                            removed with a cold biopsy forceps. Resected and                            retrieved.                           - Severe diverticulosis in the sigmoid colon. There                            was narrowing of the colon in association with the                            diverticular opening.                           - The examination was otherwise normal on direct                            and retroflexion views. Recommendation:           - Patient has a contact number available for                            emergencies. The signs and symptoms of potential                            delayed complications were discussed with the  patient. Return to normal activities tomorrow.                            Written discharge instructions were provided to the                             patient.                           - Resume previous diet.                           - Continue present medications.                           - Resume Coumadin (warfarin) tomorrow and Lovenox                            (enoxaparin) tomorrow at prior doses. Refer to                            Coumadin Clinic for further adjustment of therapy.                           - Repeat colonoscopy is recommended possdibly -                            given co-morbidities may not recommend. The                            colonoscopy date will be determined after pathology                            results from today's exam become available for                            review. [name], MD 02/13/2017 3:48:57 PM This report has been signed electronically.

## 2017-02-13 NOTE — Progress Notes (Signed)
No complaints noted in the recovery room. maw

## 2017-02-14 ENCOUNTER — Telehealth: Payer: Self-pay | Admitting: *Deleted

## 2017-02-14 NOTE — Telephone Encounter (Signed)
  Follow up Call-  Call back number 02/13/2017  Post procedure Call Back phone  # [phone]  Permission to leave phone message Yes  Some recent data might be hidden    [facility]

## 2017-02-15 ENCOUNTER — Telehealth: Payer: Self-pay | Admitting: Internal Medicine

## 2017-02-15 ENCOUNTER — Telehealth: Payer: Self-pay

## 2017-02-15 NOTE — Telephone Encounter (Signed)
Call-back post colonoscopy. No answer. Left message.

## 2017-02-15 NOTE — Telephone Encounter (Signed)
Patient's wife reports that this am patient had a BM with a large amount of bright red blood.  He had polypectomy x 3 on 02/13/17.  He is scheduled for lovenox injection this am and to resume his coumadin.  Please advise

## 2017-02-15 NOTE — Telephone Encounter (Signed)
Left message for patient to call back  

## 2017-02-15 NOTE — Telephone Encounter (Signed)
Wife notified. She will call back on Monday with an update and instructions for lovenox and coumadin. She verbalized understanding that if he has another large bleed or persistent bleeding over the weekend he will need to go to the ED.

## 2017-02-15 NOTE — Telephone Encounter (Signed)
Hold both coumadin and Lovenox for now  If bleeding persists will need to go to hospital and notify our team  Suspect a flex and a clip would fix it and could go home

## 2017-02-19 ENCOUNTER — Ambulatory Visit (INDEPENDENT_AMBULATORY_CARE_PROVIDER_SITE_OTHER): Payer: Medicare Other | Admitting: *Deleted

## 2017-02-19 ENCOUNTER — Telehealth: Payer: Self-pay | Admitting: Internal Medicine

## 2017-02-19 ENCOUNTER — Encounter (HOSPITAL_COMMUNITY): Payer: Self-pay | Admitting: Emergency Medicine

## 2017-02-19 ENCOUNTER — Inpatient Hospital Stay (HOSPITAL_COMMUNITY)
Admission: EM | Admit: 2017-02-19 | Discharge: 2017-02-25 | DRG: 920 | Disposition: A | Discharge status: Home / Self Care | Payer: Medicare Other | Attending: Internal Medicine | Admitting: Internal Medicine

## 2017-02-19 DIAGNOSIS — K91840 Postprocedural hemorrhage and hematoma of a digestive system organ or structure following a digestive system procedure: Secondary | ICD-10-CM | POA: Diagnosis not present

## 2017-02-19 DIAGNOSIS — Z87891 Personal history of nicotine dependence: Secondary | ICD-10-CM | POA: Diagnosis not present

## 2017-02-19 DIAGNOSIS — Z9889 Other specified postprocedural states: Secondary | ICD-10-CM | POA: Diagnosis not present

## 2017-02-19 DIAGNOSIS — K921 Melena: Secondary | ICD-10-CM | POA: Diagnosis present

## 2017-02-19 DIAGNOSIS — I4891 Unspecified atrial fibrillation: Secondary | ICD-10-CM | POA: Diagnosis present

## 2017-02-19 DIAGNOSIS — Z86010 Personal history of colonic polyps: Secondary | ICD-10-CM

## 2017-02-19 DIAGNOSIS — Z8249 Family history of ischemic heart disease and other diseases of the circulatory system: Secondary | ICD-10-CM | POA: Diagnosis not present

## 2017-02-19 DIAGNOSIS — Y838 Other surgical procedures as the cause of abnormal reaction of the patient, or of later complication, without mention of misadventure at the time of the procedure: Secondary | ICD-10-CM | POA: Diagnosis present

## 2017-02-19 DIAGNOSIS — K922 Gastrointestinal hemorrhage, unspecified: Secondary | ICD-10-CM | POA: Diagnosis not present

## 2017-02-19 DIAGNOSIS — E785 Hyperlipidemia, unspecified: Secondary | ICD-10-CM | POA: Diagnosis present

## 2017-02-19 DIAGNOSIS — I10 Essential (primary) hypertension: Secondary | ICD-10-CM | POA: Diagnosis not present

## 2017-02-19 DIAGNOSIS — Z5181 Encounter for therapeutic drug level monitoring: Secondary | ICD-10-CM | POA: Diagnosis not present

## 2017-02-19 DIAGNOSIS — I5022 Chronic systolic (congestive) heart failure: Secondary | ICD-10-CM | POA: Diagnosis present

## 2017-02-19 DIAGNOSIS — Z951 Presence of aortocoronary bypass graft: Secondary | ICD-10-CM

## 2017-02-19 DIAGNOSIS — Z8261 Family history of arthritis: Secondary | ICD-10-CM | POA: Diagnosis not present

## 2017-02-19 DIAGNOSIS — I2510 Atherosclerotic heart disease of native coronary artery without angina pectoris: Secondary | ICD-10-CM | POA: Diagnosis not present

## 2017-02-19 DIAGNOSIS — K5730 Diverticulosis of large intestine without perforation or abscess without bleeding: Secondary | ICD-10-CM | POA: Diagnosis present

## 2017-02-19 DIAGNOSIS — K625 Hemorrhage of anus and rectum: Secondary | ICD-10-CM | POA: Diagnosis not present

## 2017-02-19 DIAGNOSIS — Z66 Do not resuscitate: Secondary | ICD-10-CM | POA: Diagnosis present

## 2017-02-19 DIAGNOSIS — Z79899 Other long term (current) drug therapy: Secondary | ICD-10-CM | POA: Diagnosis not present

## 2017-02-19 DIAGNOSIS — R569 Unspecified convulsions: Secondary | ICD-10-CM

## 2017-02-19 DIAGNOSIS — D696 Thrombocytopenia, unspecified: Secondary | ICD-10-CM | POA: Diagnosis not present

## 2017-02-19 DIAGNOSIS — D126 Benign neoplasm of colon, unspecified: Secondary | ICD-10-CM | POA: Diagnosis not present

## 2017-02-19 DIAGNOSIS — Z7901 Long term (current) use of anticoagulants: Secondary | ICD-10-CM

## 2017-02-19 DIAGNOSIS — Z95810 Presence of automatic (implantable) cardiac defibrillator: Secondary | ICD-10-CM | POA: Diagnosis not present

## 2017-02-19 DIAGNOSIS — Z8673 Personal history of transient ischemic attack (TIA), and cerebral infarction without residual deficits: Secondary | ICD-10-CM

## 2017-02-19 DIAGNOSIS — I255 Ischemic cardiomyopathy: Secondary | ICD-10-CM | POA: Diagnosis not present

## 2017-02-19 DIAGNOSIS — I4819 Other persistent atrial fibrillation: Secondary | ICD-10-CM | POA: Diagnosis present

## 2017-02-19 DIAGNOSIS — D62 Acute posthemorrhagic anemia: Secondary | ICD-10-CM | POA: Diagnosis present

## 2017-02-19 DIAGNOSIS — Z8614 Personal history of Methicillin resistant Staphylococcus aureus infection: Secondary | ICD-10-CM | POA: Diagnosis not present

## 2017-02-19 DIAGNOSIS — I480 Paroxysmal atrial fibrillation: Secondary | ICD-10-CM | POA: Diagnosis not present

## 2017-02-19 DIAGNOSIS — D682 Hereditary deficiency of other clotting factors: Secondary | ICD-10-CM | POA: Diagnosis present

## 2017-02-19 DIAGNOSIS — G40909 Epilepsy, unspecified, not intractable, without status epilepticus: Secondary | ICD-10-CM | POA: Diagnosis present

## 2017-02-19 DIAGNOSIS — I110 Hypertensive heart disease with heart failure: Secondary | ICD-10-CM | POA: Diagnosis not present

## 2017-02-19 HISTORY — DX: Presence of automatic (implantable) cardiac defibrillator: Z95.810

## 2017-02-19 HISTORY — DX: Gastrointestinal hemorrhage, unspecified: K92.2

## 2017-02-19 LAB — COMPREHENSIVE METABOLIC PANEL
ALT: 18 U/L (ref 17–63)
ANION GAP: 5 (ref 5–15)
AST: 18 U/L (ref 15–41)
Albumin: 3.3 g/dL — ABNORMAL LOW (ref 3.5–5.0)
Alkaline Phosphatase: 75 U/L (ref 38–126)
BUN: 20 mg/dL (ref 6–20)
CHLORIDE: 108 mmol/L (ref 101–111)
CO2: 23 mmol/L (ref 22–32)
Calcium: 8.6 mg/dL — ABNORMAL LOW (ref 8.9–10.3)
Creatinine, Ser: 1.08 mg/dL (ref 0.61–1.24)
GFR calc non Af Amer: >60 mL/min (ref >60)
Glucose, Bld: 95 mg/dL (ref 65–99)
Potassium: 4.6 mmol/L (ref 3.5–5.1)
SODIUM: 136 mmol/L (ref 135–145)
Total Bilirubin: 0.5 mg/dL (ref 0.3–1.2)
Total Protein: 6.6 g/dL (ref 6.5–8.1)

## 2017-02-19 LAB — CBC
HEMATOCRIT: 33.4 % — AB (ref 39.0–52.0)
HEMOGLOBIN: 10.6 g/dL — AB (ref 13.0–17.0)
MCH: 29.2 pg (ref 26.0–34.0)
MCHC: 31.7 g/dL (ref 30.0–36.0)
MCV: 92 fL (ref 78.0–100.0)
Platelets: 127 10*3/uL — ABNORMAL LOW (ref 150–400)
RBC: 3.63 MIL/uL — AB (ref 4.22–5.81)
RDW: 13.5 % (ref 11.5–15.5)
WBC: 5.3 10*3/uL (ref 4.0–10.5)

## 2017-02-19 LAB — ABO/RH: ABO/RH(D): O POS

## 2017-02-19 LAB — I-STAT CG4 LACTIC ACID, ED: LACTIC ACID, VENOUS: 0.83 mmol/L (ref 0.5–1.9)

## 2017-02-19 LAB — HEMOGLOBIN AND HEMATOCRIT, BLOOD
HEMATOCRIT: 30.2 % — AB (ref 39.0–52.0)
Hemoglobin: 9.8 g/dL — ABNORMAL LOW (ref 13.0–17.0)

## 2017-02-19 LAB — PROTIME-INR
INR: 1.29
Prothrombin Time: 16.2 seconds — ABNORMAL HIGH (ref 11.4–15.2)

## 2017-02-19 LAB — POCT INR: INR: 1.1

## 2017-02-19 MED ORDER — SODIUM CHLORIDE 0.9% FLUSH: 3.0000 mL | INTRAVENOUS | Status: DC | PRN

## 2017-02-19 MED ORDER — METOCLOPRAMIDE HCL 5 MG/ML IJ SOLN
10.0000 mg | Freq: Once | INTRAMUSCULAR | Status: AC
  Administered 2017-02-20: 10 mg via INTRAVENOUS
  Filled 2017-02-19: qty 2

## 2017-02-19 MED ORDER — SODIUM CHLORIDE 0.9 % IV SOLN: 250.0000 mL | INTRAVENOUS | Status: DC | PRN

## 2017-02-19 MED ORDER — TRIAMCINOLONE ACETONIDE 0.1 % EX CREA
1.0000 "application " | TOPICAL_CREAM | Freq: Two times a day (BID) | CUTANEOUS | Status: DC
  Filled 2017-02-19 (×2): qty 15

## 2017-02-19 MED ORDER — SIMVASTATIN 20 MG PO TABS
20.0000 mg | ORAL_TABLET | Freq: Every day | ORAL | Status: DC
  Administered 2017-02-20 – 2017-02-25 (×5): 20 mg via ORAL
  Filled 2017-02-19 (×6): qty 1

## 2017-02-19 MED ORDER — PEG-KCL-NACL-NASULF-NA ASC-C 100 G PO SOLR: 1.0000 | Freq: Once | ORAL | Status: DC

## 2017-02-19 MED ORDER — SODIUM CHLORIDE 0.9% FLUSH
3.0000 mL | Freq: Two times a day (BID) | INTRAVENOUS | Status: DC
  Administered 2017-02-19 – 2017-02-25 (×9): 3 mL via INTRAVENOUS

## 2017-02-19 MED ORDER — METOCLOPRAMIDE HCL 5 MG/ML IJ SOLN
10.0000 mg | Freq: Once | INTRAMUSCULAR | Status: AC
  Administered 2017-02-19: 10 mg via INTRAVENOUS
  Filled 2017-02-19: qty 2

## 2017-02-19 MED ORDER — ACETAMINOPHEN 325 MG PO TABS
650.0000 mg | ORAL_TABLET | Freq: Four times a day (QID) | ORAL | Status: DC | PRN
  Administered 2017-02-21 – 2017-02-25 (×4): 650 mg via ORAL
  Filled 2017-02-19 (×4): qty 2

## 2017-02-19 MED ORDER — SODIUM CHLORIDE 0.9 % IV SOLN: INTRAVENOUS | Status: DC

## 2017-02-19 MED ORDER — PEG 3350-KCL-NABCB-NACL-NASULF 236 G PO SOLR
1000.0000 mL | Freq: Once | ORAL | Status: AC
  Administered 2017-02-19: 1000 mL via ORAL
  Filled 2017-02-19: qty 4000

## 2017-02-19 MED ORDER — HYDROCODONE-ACETAMINOPHEN 5-325 MG PO TABS
2.0000 | ORAL_TABLET | ORAL | Status: DC | PRN
  Administered 2017-02-19 – 2017-02-23 (×5): 2 via ORAL
  Filled 2017-02-19 (×6): qty 2

## 2017-02-19 MED ORDER — PHENYTOIN SODIUM EXTENDED 100 MG PO CAPS
100.0000 mg | ORAL_CAPSULE | Freq: Two times a day (BID) | ORAL | Status: DC
  Administered 2017-02-19 – 2017-02-25 (×12): 100 mg via ORAL
  Filled 2017-02-19 (×14): qty 1

## 2017-02-19 MED ORDER — TRAMADOL HCL 50 MG PO TABS: 50.0000 mg | ORAL_TABLET | Freq: Three times a day (TID) | ORAL | Status: DC | PRN

## 2017-02-19 MED ORDER — PEG 3350-KCL-NABCB-NACL-NASULF 236 G PO SOLR
1000.0000 mL | Freq: Once | ORAL | Status: AC
  Administered 2017-02-20: 1000 mL via ORAL
  Filled 2017-02-19: qty 4000

## 2017-02-19 MED ORDER — AMIODARONE HCL 200 MG PO TABS
200.0000 mg | ORAL_TABLET | Freq: Every day | ORAL | Status: DC
  Administered 2017-02-20 – 2017-02-25 (×6): 200 mg via ORAL
  Filled 2017-02-19 (×6): qty 1

## 2017-02-19 NOTE — Telephone Encounter (Signed)
See message from cardiology.  

## 2017-02-19 NOTE — ED Notes (Signed)
Pt stated "wanted to use the bathroom", but denies bedside toilet. Pt states "will hold it until pt can use the restroom and not a bedside toilet".

## 2017-02-19 NOTE — ED Provider Notes (Signed)
[facility] DEPT Provider Note   CSN: [id number] Arrival date & time: 02/19/17  1420     History   Chief Complaint Chief Complaint  Patient presents with  . Rectal Bleeding    HPI [facility]. is a 71 y.o. male.  HPI 71 year old male with history of CHF, CAD, atrial fibrillation on warfarin, recent colonoscopy with severe diverticulosis and some polyps status post polypectomy who presents with bright red blood per rectum for the last 5 days. Patient had a colonoscopy 6 days ago that revealed severe diverticulosis and some polyps which were removed. He resumed his Lovenox the following day and he started having bright red blood per rectum. Then he stopped his Lovenox and warfarin. However, he continued to have bright red blood per rectum despite stopping the blood thinners. Patient had 2 bowel movements this morning, last one about 5 hours ago with commode full of bright red blood.  He denies abdominal pain, chest pain, shortness of breasts or dizziness.  Past Medical History:  Diagnosis Date  . Atrial fibrillation   09/22/2012  . CAD (coronary artery disease)   . Chronic systolic heart failure ([place])   . 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 History of MRSA infection 05/2011  . HTN (hypertension)   . Hyperlipidemia   . implantable cardiac defibrillator-Biotronik    Device Implanted 2006; s/p gen change 03/2011 : bleeding persistent with pocket erosion and infection; explant and reimplant  06/2011  . Ischemic cardiomyopathy    EF 15 to 20% by TTE and TEE in 09/2012.  Severe LV dysfunction  . Obesity    BMI 31 in 09/2012  . Seizure disorder ([place]) latest 09/30/2012  . Seizures ([place])   . Stroke [facility])     Patient Active Problem List   Diagnosis Date Noted  . GIB (gastrointestinal bleeding) 02/19/2017  . Acute pain of left knee 01/22/2017  . Thrush 12/15/2016  . Skin rash 12/13/2016  . Hematochezia 12/13/2016  . Chronic kidney  disease (CKD), stage III (moderate) 11/08/2016  . Blood in stool 10/26/2016  . Hypertensive heart disease without heart failure   . Chest pain 08/17/2016  . Periodontal disease 05/18/2016  . Dental infection 05/18/2016  . HLD (hyperlipidemia) 02/26/2016  . CVA (cerebral infarction) 02/26/2016  . Near syncope 02/26/2016  . Injury of right thumb 12/30/2015  . Gout 12/30/2015  . Loss of consciousness ([place]) 01/05/2015  . Afib ([place]) 04/21/2014  . Encounter for therapeutic drug monitoring 01/19/2014  . Chronic systolic heart failure ([place]) 11/06/2012  . 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 Coagulopathy ([place]) 10/03/2012  . Seizure ([place]) 10/01/2012  . Renal insufficiency 10/01/2012  . Ischemic cardiomyopathy  ? additional rate component 09/29/2012  . Automatic implantable cardioverter-defibrillator in situ   . HTN (hypertension) 09/21/2012  . Coronary artery disease involving native heart 09/21/2012    Past Surgical History:  Procedure Laterality Date  . CARDIAC CATHETERIZATION N/A 10/12/2015   Procedure: Right Heart Cath;  Surgeon: [name], MD;  Location: [place] CV LAB;  Service: Cardiovascular;  Laterality: N/A;  . CARDIOVERSION  09/24/2012   Procedure: CARDIOVERSION;  Surgeon: [name], MD;  Location: [facility] ENDOSCOPY;  Service: Cardiovascular;  Laterality: N/A;  . CORONARY ARTERY BYPASS GRAFT    . ICD    . TEE WITHOUT CARDIOVERSION  09/24/2012   Procedure: TRANSESOPHAGEAL ECHOCARDIOGRAM (TEE);  Surgeon: [name], MD;  Location: [place] [facility];  Service: Cardiovascular;  Laterality:  N/A;  dave/anesth, dl, cindy/echo        Home Medications    Prior to Admission medications   Medication Sig Start Date End Date Taking? Authorizing Provider  acetaminophen (TYLENOL) 325 MG tablet Take 650 mg by mouth every 6 (six) hours as needed for mild pain.    Historical Provider, MD  amiodarone (PACERONE) 200 MG tablet Take 1 tablet (200 mg total) by mouth daily.  08/24/16   [name], MD  Ascorbic Acid (VITAMIN C) 100 MG tablet Take 100 mg by mouth daily.    Historical Provider, MD  BIDIL 20-37.5 MG tablet TAKE TWO TABLETS BY MOUTH THREE TIMES DAILY 11/19/16   Shirley Friar, PA-C  carvedilol (COREG) 3.125 MG tablet Take 1 tablet (3.125 mg total) by mouth 2 (two) times daily. 02/04/17   [name], MD  enoxaparin (LOVENOX) 100 MG/ML injection Inject 1 mL (100 mg total) into the skin every 12 (twelve) hours. As instructed by Coumadin Clinic 02/04/17   [name], MD  furosemide (LASIX) 40 MG tablet Take 1 tablet (40 mg total) by mouth daily. 07/24/16   [name], MD  HYDROcodone-acetaminophen (NORCO/VICODIN) 5-325 MG tablet Take 2 tablets by mouth every 4 (four) hours as needed. 01/11/17   Lysbeth Penner, FNP  isosorbide-hydrALAZINE (BIDIL) 20-37.5 MG tablet Take 2 tablets by mouth 3 (three) times daily. 06/19/16   Shirley Friar, PA-C  losartan (COZAAR) 25 MG tablet Take 1 tablet (25 mg total) by mouth daily. 03/13/16   Shirley Friar, PA-C  MULTIPLE VITAMIN PO Take by mouth daily.    Historical Provider, MD  nystatin (MYCOSTATIN) 100000 UNIT/ML suspension SWISH, GARGLE AND SPIT 5 TO 10 ML BY MOUTH EVERY 6 HOURS AS NEEDED 01/07/17   [name], MD  phenytoin (DILANTIN) 100 MG ER capsule Take 1 capsule (100 mg total) by mouth 2 (two) times daily. 08/24/16   [name], MD  predniSONE (DELTASONE) 10 MG tablet 2 tabs by mouth per day for 5 days 12/13/16   Biagio Borg, MD  simvastatin (ZOCOR) 20 MG tablet Take 1 tablet (20 mg total) by mouth daily at 6 PM. 04/02/16   [name], MD  spironolactone (ALDACTONE) 25 MG tablet Take 0.5 tablets (12.5 mg total) by mouth daily. 09/11/16   [name], MD  traMADol (ULTRAM) 50 MG tablet Take 1 tablet (50 mg total) by mouth every 8 (eight) hours as needed. 01/22/17   [name], MD  triamcinolone cream (KENALOG) 0.1 % Apply 1 application  topically 2 (two) times daily. 08/16/16   [name], MD  warfarin (COUMADIN) 4 MG tablet Take as directed by Coumadin Clinic 12/26/16   [name], MD    Family History Family History  Problem Relation Age of Onset  . Arthritis Mother   . Heart disease Mother   . Heart attack Mother   . Other Father     smoker  . Hypertension Neg Hx     unknown  . Stroke Neg Hx     unknown    Social History Social History  Substance Use Topics  . Smoking status: Former Smoker    Types: Pipe    Quit date: 09/21/2008  . Smokeless tobacco: Former Systems developer    Quit date: 09/21/2008  . Alcohol use No     Allergies   Patient has no known allergies.   Review of Systems Review of Systems  Constitutional: Negative for  chills and fever.  HENT: Negative for sore throat and trouble swallowing.   Eyes: Negative for pain and visual disturbance.  Respiratory: Negative for cough and shortness of breath.   Cardiovascular: Negative for chest pain and palpitations.  Gastrointestinal: Positive for blood in stool. Negative for abdominal distention, abdominal pain, constipation, diarrhea, nausea and vomiting.  Genitourinary: Negative for dysuria and hematuria.  Musculoskeletal: Negative for back pain.  Skin: Negative for color change and rash.  Neurological: Negative for seizures and syncope.  All other systems reviewed and are negative.    Physical Exam Updated Vital Signs BP 130/88   Pulse (!) 59   Temp 97.4 F (36.3 C) (Oral)   Resp 17   SpO2 95%   Physical Exam GEN: appears well, no apparent distress. Head: normocephalic and atraumatic  Eyes: mild conjunctival pallor, sclera anicteric Oropharynx: mmm without erythema or exudation HEM: negative for cervical or periauricular lymphadenopathies CVS: RRR, nl s1 & s2, no murmurs, no edema RESP: speaks in full sentence, no IWOB, good air movement bilaterally, CTAB GI: BS present & normal, soft, NTND, no guarding, no rebound, no  mass MSK: no focal tenderness or notable swelling SKIN: no apparent skin lesion NEURO: alert and oiented appropriately, no gross defecits  PSYCH: euthymic mood with congruent affect  ED Treatments / Results  Labs (all labs ordered are listed, but only abnormal results are displayed) Labs Reviewed  COMPREHENSIVE METABOLIC PANEL - Abnormal; Notable for the following:       Result Value   Calcium 8.6 (*)    Albumin 3.3 (*)    All other components within normal limits  CBC - Abnormal; Notable for the following:    RBC 3.63 (*)    Hemoglobin 10.6 (*)    HCT 33.4 (*)    Platelets 127 (*)    All other components within normal limits  PROTIME-INR  I-STAT CG4 LACTIC ACID, ED  POC OCCULT BLOOD, ED  TYPE AND SCREEN  ABO/RH    EKG  EKG Interpretation None       Radiology No results found.  Procedures Procedures (including critical care time)  Medications Ordered in ED Medications  metoCLOPramide (REGLAN) injection 10 mg (not administered)    Followed by  metoCLOPramide (REGLAN) injection 10 mg (not administered)  polyethylene glycol (GoLYTELY/NuLYTELY) solution 1,000 mL (not administered)    And  polyethylene glycol (GoLYTELY/NuLYTELY) solution 1,000 mL (not administered)     Initial Impression / Assessment and Plan / ED Course  I have reviewed the triage vital signs and the nursing notes.  Pertinent labs & imaging results that were available during my care of the patient were reviewed by me and considered in my medical decision making (see chart for details).  Patient with bright red blood per rectum after recent colonoscopy and polypectomy 6 days ago. Bleed likely diverticular. it could also be due to polypectomy. No finding suggestive for infectious etiology. Hemoglobin dropped from 15 to 10 in a month, most likely in the last 6 days. He is hemodynamically stable. He is asymptomatic. Patient has significant cardiac history including atrial fibrillation on warfarin for  anticoagulation, CHF and ischemic cardiomyopathy with ICD in place and CAD.   GI is at bedside to evaluating the patient and want the patient admitted for observation overnight and GI evaluation if needed. Will admit [facility] to [facility].   Final Clinical Impressions(s) / ED Diagnoses   Final diagnoses:  None    New Prescriptions Current Discharge Medication List  Mercy Riding, MD 02/19/17 2025    Mercy Riding, MD 02/19/17 2025    Duffy Bruce, MD 02/20/17 1113

## 2017-02-19 NOTE — Progress Notes (Signed)
Called for report for patient in ED.  States in emergency will have to call back.

## 2017-02-19 NOTE — ED Triage Notes (Signed)
Pt reports colonoscopy on 2/21, takes coumadin, reports he was told that he had some bleeding after procedure. Woke up the next day and noticed blood in his underwear. Pt states he has been having bloody bm's since. Pt a/ox4, resp e/u, skin warm and dry.

## 2017-02-19 NOTE — ED Notes (Signed)
This Rn attempted Iv access twice with no success. Another RN to try.

## 2017-02-19 NOTE — H&P (Signed)
[facility] History and Physical  [facility]. [id number] DOB: 1946-08-04 DOA: 02/19/2017  PCP: [name], MD  Patient coming from: Home  Chief Complaint: Rectal bleeding  HPI: [facility]. is a 71 y.o. male with a medical history of atrial fibrillation, hypertension, coronary artery disease, history of ischemic cardiomyopathy and systolic heart failure, seizure disorder, who presented to the emergency department with complaints of rectal bleeding. Patient had been having rectal bleeding profusely one month at which point he did see gastroenterologist. Patient is status post colonoscopy on 02/13/2017, with polypectomy. Since that time, he has been bleeding. He has not been on Lovenox or Coumadin since 02/14/2017. Patient currently denies any chest pain, shortness breath, dizziness, headache, abdominal pain, nausea or vomiting, diarrhea or constipation. He denies any recent ill contacts or travel. Currently, patient has no complaints.  ED Course: Found have a hemoglobin of 10.6. Gastroenterology consulted. TR[facility] called for admission.  Review of Systems:  All other systems reviewed and are negative.   Past Medical History:  Diagnosis Date  . Atrial fibrillation   09/22/2012  . CAD (coronary artery disease)   . Chronic systolic heart failure ([place])   . 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 History of MRSA infection 05/2011  . HTN (hypertension)   . Hyperlipidemia   . implantable cardiac defibrillator-Biotronik    Device Implanted 2006; s/p gen change 03/2011 : bleeding persistent with pocket erosion and infection; explant and reimplant  06/2011  . Ischemic cardiomyopathy    EF 15 to 20% by TTE and TEE in 09/2012.  Severe LV dysfunction  . Obesity    BMI 31 in 09/2012  . Seizure disorder ([place]) latest 09/30/2012  . Seizures ([place])   . Stroke [facility])     Past Surgical History:  Procedure Laterality Date  . CARDIAC  CATHETERIZATION N/A 10/12/2015   Procedure: Right Heart Cath;  Surgeon: [name], MD;  Location: [place] CV LAB;  Service: Cardiovascular;  Laterality: N/A;  . CARDIOVERSION  09/24/2012   Procedure: CARDIOVERSION;  Surgeon: [name], MD;  Location: [facility] ENDOSCOPY;  Service: Cardiovascular;  Laterality: N/A;  . CORONARY ARTERY BYPASS GRAFT    . ICD    . TEE WITHOUT CARDIOVERSION  09/24/2012   Procedure: TRANSESOPHAGEAL ECHOCARDIOGRAM (TEE);  Surgeon: [name], MD;  Location: [facility] ENDOSCOPY;  Service: Cardiovascular;  Laterality: N/A;  dave/anesth, dl, cindy/echo     Social History:  reports that he quit smoking about 8 years ago. His smoking use included Pipe. He quit smokeless tobacco use about 8 years ago. He reports that he does not drink alcohol or use drugs.   No Known Allergies  Family History  Problem Relation Age of Onset  . Arthritis Mother   . Heart disease Mother   . Heart attack Mother   . Other Father     smoker  . Hypertension Neg Hx     unknown  . Stroke Neg Hx     unknown    Prior to Admission medications   Medication Sig Start Date End Date Taking? Authorizing Provider  acetaminophen (TYLENOL) 325 MG tablet Take 650 mg by mouth every 6 (six) hours as needed for mild pain.    Historical Provider, MD  amiodarone (PACERONE) 200 MG tablet Take 1 tablet (200 mg total) by mouth daily. 08/24/16   [name], MD  Ascorbic Acid (VITAMIN C) 100 MG tablet Take 100 mg by mouth  daily.    Historical Provider, MD  BIDIL 20-37.5 MG tablet TAKE TWO TABLETS BY MOUTH THREE TIMES DAILY 11/19/16   Shirley Friar, PA-C  carvedilol (COREG) 3.125 MG tablet Take 1 tablet (3.125 mg total) by mouth 2 (two) times daily. 02/04/17   [name], MD  enoxaparin (LOVENOX) 100 MG/ML injection Inject 1 mL (100 mg total) into the skin every 12 (twelve) hours. As instructed by Coumadin Clinic 02/04/17   [name], MD  furosemide (LASIX) 40 MG tablet  Take 1 tablet (40 mg total) by mouth daily. 07/24/16   [name], MD  HYDROcodone-acetaminophen (NORCO/VICODIN) 5-325 MG tablet Take 2 tablets by mouth every 4 (four) hours as needed. 01/11/17   Lysbeth Penner, FNP  isosorbide-hydrALAZINE (BIDIL) 20-37.5 MG tablet Take 2 tablets by mouth 3 (three) times daily. 06/19/16   Shirley Friar, PA-C  losartan (COZAAR) 25 MG tablet Take 1 tablet (25 mg total) by mouth daily. 03/13/16   Shirley Friar, PA-C  MULTIPLE VITAMIN PO Take by mouth daily.    Historical Provider, MD  nystatin (MYCOSTATIN) 100000 UNIT/ML suspension SWISH, GARGLE AND SPIT 5 TO 10 ML BY MOUTH EVERY 6 HOURS AS NEEDED 01/07/17   [name], MD  phenytoin (DILANTIN) 100 MG ER capsule Take 1 capsule (100 mg total) by mouth 2 (two) times daily. 08/24/16   [name], MD  predniSONE (DELTASONE) 10 MG tablet 2 tabs by mouth per day for 5 days 12/13/16   Biagio Borg, MD  simvastatin (ZOCOR) 20 MG tablet Take 1 tablet (20 mg total) by mouth daily at 6 PM. 04/02/16   [name], MD  spironolactone (ALDACTONE) 25 MG tablet Take 0.5 tablets (12.5 mg total) by mouth daily. 09/11/16   [name], MD  traMADol (ULTRAM) 50 MG tablet Take 1 tablet (50 mg total) by mouth every 8 (eight) hours as needed. 01/22/17   [name], MD  triamcinolone cream (KENALOG) 0.1 % Apply 1 application topically 2 (two) times daily. 08/16/16   [name], MD  warfarin (COUMADIN) 4 MG tablet Take as directed by Coumadin Clinic 12/26/16   [name], MD    Physical Exam: Vitals:   02/19/17 1442 02/19/17 1700  BP: (!) 137/117 130/80  Pulse: 86 80  Resp: 16 17  Temp: 97.4 F (36.3 C)      General: Well developed, well nourished, NAD, appears stated age  HEENT: NCAT, PERRLA, EOMI, Anicteic Sclera, mucous membranes moist. Ppor denition   Neck: Supple, no JVD, no masses  Cardiovascular: S1 S2 auscultated, no rubs, murmurs or gallops.  irregular  Respiratory: Clear to auscultation bilaterally with equal chest rise  Abdomen: Soft, nontender, nondistended, + bowel sounds  Extremities: warm dry without cyanosis clubbing or edema  Neuro: AAOx3, cranial nerves grossly intact. Strength 5/5 in patient's upper and lower extremities bilaterally  Skin: Without rashes exudates or nodules  Psych: Normal affect and demeanor with intact judgement and insight  Labs on Admission: I have personally reviewed following labs and imaging studies CBC:  Recent Labs Lab 02/19/17 1457  WBC 5.3  HGB 10.6*  HCT 33.4*  MCV 92.0  PLT [id number]   Basic Metabolic Panel:  Recent Labs Lab 02/19/17 1457  NA 136  K 4.6  CL 108  CO2 23  GLUCOSE 95  BUN 20  CREATININE 1.08  CALCIUM 8.6*   GFR: Estimated Creatinine Clearance: 78 mL/min (by C-G formula based on SCr  of 1.08 mg/dL). Liver Function Tests:  Recent Labs Lab 02/19/17 1457  AST 18  ALT 18  ALKPHOS 75  BILITOT 0.5  PROT 6.6  ALBUMIN 3.3*   No results for input(s): LIPASE, AMYLASE in the last 168 hours. No results for input(s): AMMONIA in the last 168 hours. Coagulation Profile:  Recent Labs Lab 02/19/17 1350  INR 1.1   Cardiac Enzymes: No results for input(s): CKTOTAL, CKMB, CKMBINDEX, TROPONINI in the last 168 hours. BNP (last 3 results) No results for input(s): PROBNP in the last 8760 hours. HbA1C: No results for input(s): HGBA1C in the last 72 hours. CBG: No results for input(s): GLUCAP in the last 168 hours. Lipid Profile: No results for input(s): CHOL, HDL, LDLCALC, TRIG, CHOLHDL, LDLDIRECT in the last 72 hours. Thyroid Function Tests: No results for input(s): TSH, T4TOTAL, FREET4, T3FREE, THYROIDAB in the last 72 hours. Anemia Panel: No results for input(s): VITAMINB12, FOLATE, FERRITIN, TIBC, IRON, RETICCTPCT in the last 72 hours. Urine analysis:    Component Value Date/Time   COLORURINE YELLOW 02/27/2016 1552   APPEARANCEUR CLEAR 02/27/2016  1552   LABSPEC 1.012 02/27/2016 1552   PHURINE 5.5 02/27/2016 1552   GLUCOSEU NEGATIVE 02/27/2016 1552   HGBUR NEGATIVE 02/27/2016 1552   BILIRUBINUR NEGATIVE 02/27/2016 1552   KETONESUR NEGATIVE 02/27/2016 1552   PROTEINUR NEGATIVE 02/27/2016 1552   UROBILINOGEN 1.0 10/13/2013 1855   NITRITE NEGATIVE 02/27/2016 1552   LEUKOCYTESUR NEGATIVE 02/27/2016 1552   Sepsis Labs: @LABRCNTIP (procalcitonin:4,lacticidven:4) )No results found for this or any previous visit (from the past 240 hour(s)).   Radiological Exams on Admission: No results found.  EKG: None  Assessment/Plan  GI Bleed, lower/ Acute blood loss anemia/ Diverticular bleed -S/p colonoscopy with polypectomy on 02/13/2017: Left-sided diverticulosis -Patient has been on lovenox (not since 02/14/2017) and coumadin (held since 02/07/2017) and currently INR 1.1 -Currently patient asymptomatic -Hemoglobin currently 10.6. Upon review patient's chart, baseline hemoglobin 15. -Will admit patient for tele -Continue to monitor H&H every 8 hours -Patient will need 2 large-bore IVs -Placed on clear liquid diet -Gastroenterology consulted and appreciated. -Plan for flexible sigmoidoscopy to 02/20/2017  Atrial fibrillation -CHADSVASC 5 (age, h/o CHF, HTN, CVA) -Lovenox and Coumadin currently held -INR currently subtherapeutic at 1.1 -Continue amiodarone -Continuation of anticoagulation will need to be decided.  Essential hypertension -BP currently soft, will hold patient's BP medications given his GI bleed -Continue to monitor closely  Hyperlipidemia -Continue statin  Seizure disorder -Continue Dilantin  History of coronary artery disease/ischemic cardiomyopathy -EF in 2013 was 15-20%. Has ICD placed. -Currently chest pain free, and euvolemic -Given current GI bleed, will hold patient's Coreg, BiDil, Lasix, losartan, spironolactone -Monitor intake and output, daily weight -Last echocardiogram 02/27/2016 showed an EF of  0000000, grade 2 diastolic dysfunction  Thrombocytopenia, chronic -Platelets 127, will continue to monitor CBC   DVT prophylaxis: SCDs  Code Status: DNR  Family Communication: Wife at bedside. Admission, patients condition and plan of care including tests being ordered have been discussed with the patient and wife who indicate understanding and agree with the plan and Code Status.  Disposition Plan: Home when stable   Consults called: Gastroenterology   Admission status: Observation    Time spent: 65 minutes  Dianna Deshler D.O. [facility] Pager [phone]  If 7PM-7AM, please contact night-coverage [email or web address] Password [facility] 02/19/2017, 5:15 PM

## 2017-02-19 NOTE — Consult Note (Signed)
[facility] Consult: 3:45 PM 02/19/2017  LOS: 0 days    Referring Provider: Dr Ellender Hose  Primary Care Physician:  [name], MD Primary Gastroenterologist:  Dr. Carlean Purl     Reason for Consultation:  Hematochezia post colon polypectomy   HPI: [place]. is a 71 y.o. male.  PMH Obesity. CAD.  S/p CABG. A fib. Chronic Coumadin.  CHF (EF 55 to 60 %in 02/2016 but as low as 15% in past). ICD placed 2006.  CVA. Seizures.    Factor 7 deficiency.  HTN. Dental caries and oral pain.  Intermittent thrombocytopenia dates to 2013 (101K in 2015)     02/13/17 Colonoscopy for rectal bleeding with polypectomies.  3 cm sigmoid polyp hot snared and injected with epi to prevent bleeding (path TVA). 2 tiny polyps (path TAs) in transverse colon were cold resected.  Severe sigmoid tics a/w colon narrowing.   Coumadin, along with 5-6 days of Lovenox, resumed on 2/22. That same day he began having hematochezia. There was no abdominal pain, anorexia or nausea and vomiting associated with this. Initial episode of hematochezia occurred at night and he sought on his bedclothes the next day. Thereafter he would feel like he needed to have a p.m. and ended up passing blood. The blood was moderate volume it would sit down at the bottom of the commode.He contacted the GI office on 02/15/17 and was advised to stop both the Coumadin and Lovenox. He was advised that if he did not stop bleeding he should come to the ED as Dr. Carlean Purl would need to perform flexible sigmoidoscopy and Endo clipping for what was presumed to be a post polypectomy bleed.  The bleeding has continued but he did not seek medical attention. Today he went for Coumadin clinic follow-up and provided history of ongoing hematochezia. They told him he needed to come to the  ED.  Hgb 10.6 today, was 15.3 on 01/07/17.  INR 1.1.  BUN normal.  Patient denies lightheadedness, presyncope, chest pain, shortness of breath, swelling. He has not been using any aspirin or nonsteroidal products. Patient denies any other type of bleeding besides the hematochezia. I.e. no oral or nasal bleeding no excessive bruising or skin bleeding. For knee pain from a fall he took several weeks ago, he is taking tramadol and acetaminophen. Within the past few weeks orthopedic M.D. has drawn fluid off the knee and injected the knee with steroid.   Past Medical History:  Diagnosis Date  . Atrial fibrillation   09/22/2012  . CAD (coronary artery disease)   . Chronic systolic heart failure ([place])   . 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 History of MRSA infection 05/2011  . HTN (hypertension)   . Hyperlipidemia   . implantable cardiac defibrillator-Biotronik    Device Implanted 2006; s/p gen change 03/2011 : bleeding persistent with pocket erosion and infection; explant and reimplant  06/2011  . Ischemic cardiomyopathy    EF 15 to 20% by TTE and TEE in 09/2012.  Severe LV dysfunction  .  Obesity    BMI 31 in 09/2012  . Seizure disorder ([place]) latest 09/30/2012  . Seizures ([place])   . Stroke [facility])     Past Surgical History:  Procedure Laterality Date  . CARDIAC CATHETERIZATION N/A 10/12/2015   Procedure: Right Heart Cath;  Surgeon: [name], MD;  Location: [place] CV LAB;  Service: Cardiovascular;  Laterality: N/A;  . CARDIOVERSION  09/24/2012   Procedure: CARDIOVERSION;  Surgeon: [name], MD;  Location: [facility] ENDOSCOPY;  Service: Cardiovascular;  Laterality: N/A;  . CORONARY ARTERY BYPASS GRAFT    . ICD    . TEE WITHOUT CARDIOVERSION  09/24/2012   Procedure: TRANSESOPHAGEAL ECHOCARDIOGRAM (TEE);  Surgeon: [name], MD;  Location: [facility] ENDOSCOPY;  Service: Cardiovascular;  Laterality: N/A;  dave/anesth, dl, cindy/echo      Prior to Admission medications   Medication Sig Start Date End Date Taking? Authorizing Provider  acetaminophen (TYLENOL) 325 MG tablet Take 650 mg by mouth every 6 (six) hours as needed for mild pain.    Historical Provider, MD  amiodarone (PACERONE) 200 MG tablet Take 1 tablet (200 mg total) by mouth daily. 08/24/16   [name], MD  amoxicillin (AMOXIL) 500 MG capsule Take 1 capsule (500 mg total) by mouth 3 (three) times daily. 10/26/16   [name], MD  Ascorbic Acid (VITAMIN C) 100 MG tablet Take 100 mg by mouth daily.    Historical Provider, MD  BIDIL 20-37.5 MG tablet TAKE TWO TABLETS BY MOUTH THREE TIMES DAILY 11/19/16   Shirley Friar, PA-C  carvedilol (COREG) 3.125 MG tablet Take 1 tablet (3.125 mg total) by mouth 2 (two) times daily. 02/04/17   [name], MD  enoxaparin (LOVENOX) 100 MG/ML injection Inject 1 mL (100 mg total) into the skin every 12 (twelve) hours. As instructed by Coumadin Clinic 02/04/17   [name], MD  furosemide (LASIX) 40 MG tablet Take 1 tablet (40 mg total) by mouth daily. 07/24/16   [name], MD  HYDROcodone-acetaminophen (NORCO/VICODIN) 5-325 MG tablet Take 2 tablets by mouth every 4 (four) hours as needed. 01/11/17   Lysbeth Penner, FNP  isosorbide-hydrALAZINE (BIDIL) 20-37.5 MG tablet Take 2 tablets by mouth 3 (three) times daily. 06/19/16   Shirley Friar, PA-C  losartan (COZAAR) 25 MG tablet Take 1 tablet (25 mg total) by mouth daily. 03/13/16   Shirley Friar, PA-C  MULTIPLE VITAMIN PO Take by mouth daily.    Historical Provider, MD  nystatin (MYCOSTATIN) 100000 UNIT/ML suspension SWISH, GARGLE AND SPIT 5 TO 10 ML BY MOUTH EVERY 6 HOURS AS NEEDED 01/07/17   [name], MD  phenytoin (DILANTIN) 100 MG ER capsule Take 1 capsule (100 mg total) by mouth 2 (two) times daily. 08/24/16   [name], MD  predniSONE (DELTASONE) 10 MG tablet 2 tabs by mouth per day for 5 days 12/13/16    Biagio Borg, MD  simvastatin (ZOCOR) 20 MG tablet Take 1 tablet (20 mg total) by mouth daily at 6 PM. 04/02/16   [name], MD  spironolactone (ALDACTONE) 25 MG tablet Take 0.5 tablets (12.5 mg total) by mouth daily. 09/11/16   [name], MD  traMADol (ULTRAM) 50 MG tablet Take 1 tablet (50 mg total) by mouth every 8 (eight) hours as needed. 01/22/17   [name], MD  triamcinolone cream (KENALOG) 0.1 % Apply 1 application topically 2 (two) times daily. 08/16/16   [name],  MD  warfarin (COUMADIN) 4 MG tablet Take as directed by Coumadin Clinic 12/26/16   [name], MD    Scheduled Meds:  Infusions:  PRN Meds:    Allergies as of 02/19/2017  . (No Known Allergies)    Family History  Problem Relation Age of Onset  . Arthritis Mother   . Heart disease Mother   . Heart attack Mother   . Other Father     smoker  . Hypertension Neg Hx     unknown  . Stroke Neg Hx     unknown    Social History   Social History  . Marital status: Single    Spouse name: N/A  . Number of children: N/A  . Years of education: N/A   Occupational History  . Not on file.   Social History Main Topics  . Smoking status: Former Smoker    Types: Pipe    Quit date: 09/21/2008  . Smokeless tobacco: Former Systems developer    Quit date: 09/21/2008  . Alcohol use No  . Drug use: No  . Sexual activity: No   Other Topics Concern  . Not on file   Social History Narrative   ** Merged History Encounter **        REVIEW OF SYSTEMS: Constitutional:  Feels well, perhaps a bit tired. ENT:  No nose bleeds.  Patient has had pain in his upper left jaw from dental disease for several weeks. Pulm:  No shortness of breath or cough. CV:  No palpitations, no LE edema. No chest pain. GU:  No hematuria, no frequency GI:  Per HPI Heme:  Per HPI   Transfusions:  Do not find records of previous blood transfusions in Epic. Neuro:  No headaches, no peripheral tingling or  numbness Derm:  No itching, no rash or sores.  Endocrine:  No sweats or chills.  No polyuria or dysuria Immunization:  His flu, Pneumovax vaccinations are up-to-date. Travel:  [place] beyond local counties in last few months.    PHYSICAL EXAM: Vital signs in last 24 hours: Vitals:   02/19/17 1442  BP: (!) 137/117  Pulse: 86  Resp: 16  Temp: 97.4 F (36.3 C)   Wt Readings from Last 3 Encounters:  02/13/17 97.5 kg (215 lb)  01/28/17 97.5 kg (215 lb)  01/22/17 97.5 kg (215 lb)    General: Pleasant, well-appearing AAM wo is comfortable. Head:  No facial asymmetry or swelling.  Eyes:  No scleral icterus, no conjunctival pallor. Ears:  Not HOH.  Nose:  No discharge or congestion. Mouth:  Patient has a large wad of facial tissue pushed up against his upper left jaw. This was removed. There is no visible blood. There are some caries ridden nubs of teeth Neck:  No JVD, thyromegaly or masses.  Scar at base of neck, looks like it came from a tracheostomy though the patient denies previous tracheostomy. Lungs:  Unlabored breathing. No cough. Lungs clear to auscultation and percussion bilaterally. Heart:  Irregularly irregular rhythm. Rate is not bradycardic or accelerated. No MRG.  ICD visible/palpable in the upper right chest. Abdomen:  Soft. Active bowel sounds.  NT, ND.  No masses or hepatosplenomegaly. No bruits, no hernias..   Rectal:  Deferred rectal exam.   Musc/Skeltl:  Swelling and a small bruise on the left knee. Extremities:  No CCE.  Neurologic:  Alert. Oriented times 3. No tremor. No limb weakness, moves all 4 limbs. Skin:  No rashes, no sores, no suspicious lesions  Tattoos:  [place] seen. Nodes:  No cervical adenopathy.   Psych:  Calm, pleasant, cooperative.  Intake/Output from previous day: No intake/output data recorded. Intake/Output this shift: No intake/output data recorded.  LAB RESULTS:  Recent Labs  02/19/17 1457  WBC 5.3  HGB 10.6*  HCT 33.4*  PLT 127*    BMET Lab Results  Component Value Date   NA 137 12/13/2016   NA 138 11/08/2016   NA 137 10/25/2016   K 5.2 (H) 12/13/2016   K 4.5 11/08/2016   K 4.7 10/25/2016   CL 103 12/13/2016   CL 106 11/08/2016   CL 103 10/25/2016   CO2 31 12/13/2016   CO2 26 11/08/2016   CO2 25 10/25/2016   GLUCOSE 77 12/13/2016   GLUCOSE 74 11/08/2016   GLUCOSE 80 10/25/2016   BUN 23 12/13/2016   BUN 19 11/08/2016   BUN 21 (H) 10/25/2016   CREATININE 1.24 12/13/2016   CREATININE 1.14 11/08/2016   CREATININE 1.19 10/25/2016   CALCIUM 9.1 12/13/2016   CALCIUM 8.7 (L) 11/08/2016   CALCIUM 8.9 10/25/2016   LFT No results for input(s): PROT, ALBUMIN, AST, ALT, ALKPHOS, BILITOT, BILIDIR, IBILI in the last 72 hours. PT/INR Lab Results  Component Value Date   INR 1.1 02/19/2017   INR 1.6 02/04/2017   INR 2.3 01/04/2017   PROTIME 13.3 (H) 10/03/2012   Hepatitis Panel No results for input(s): HEPBSAG, HCVAB, HEPAIGM, HEPBIGM in the last 72 hours. C-Diff No components found for: CDIFF Lipase  No results found for: LIPASE  Drugs of Abuse     Component Value Date/Time   LABOPIA [place] DETECTED 09/21/2012 0836   COCAINSCRNUR [place] DETECTED 09/21/2012 0836   LABBENZ [place] DETECTED 09/21/2012 0836   AMPHETMU [place] DETECTED 09/21/2012 0836   THCU [place] DETECTED 09/21/2012 0836   LABBARB [place] DETECTED 09/21/2012 0836     RADIOLOGY STUDIES: No results found.   IMPRESSION:   *  Bleeding post colon polypectomy.  Presume this is a post polypectomy bleed though possibly it could be a stuttering diverticular bleed given the dense left-sided diverticulosis seen on colonoscopy.  *  Blood loss anemia.  So far this is asymptomatic.  *  Chronic Coumadin for Afib.  Resumed post colonoscopy.  INR subtherapeutic.   *  Chronic intermittent thrombocytopenia.     PLAN:     *  Patient can have clears tonight. Will give split dose movie prep beginning this evening, complete this tomorrow and plan flex sig  tomorrow afternoon.   Follow Hgb   Azucena Freed  02/19/2017, 3:45 PM Pager: [phone]

## 2017-02-19 NOTE — Telephone Encounter (Signed)
Yes needs to go to ED Please alert Dr. Hilarie Fredrickson -

## 2017-02-19 NOTE — Telephone Encounter (Signed)
Dr. Norman Herrlich notified

## 2017-02-20 ENCOUNTER — Encounter (HOSPITAL_COMMUNITY): Payer: Self-pay | Admitting: General Practice

## 2017-02-20 ENCOUNTER — Encounter: Payer: Self-pay | Admitting: Internal Medicine

## 2017-02-20 ENCOUNTER — Other Ambulatory Visit: Payer: Self-pay

## 2017-02-20 ENCOUNTER — Encounter (HOSPITAL_COMMUNITY)
Admission: EM | Disposition: A | Discharge status: Home / Self Care | Payer: Self-pay | Source: Home / Self Care | Attending: Internal Medicine

## 2017-02-20 DIAGNOSIS — R569 Unspecified convulsions: Secondary | ICD-10-CM | POA: Diagnosis not present

## 2017-02-20 DIAGNOSIS — Z9889 Other specified postprocedural states: Secondary | ICD-10-CM

## 2017-02-20 DIAGNOSIS — Z86010 Personal history of colonic polyps: Secondary | ICD-10-CM | POA: Diagnosis not present

## 2017-02-20 DIAGNOSIS — I255 Ischemic cardiomyopathy: Secondary | ICD-10-CM

## 2017-02-20 DIAGNOSIS — I10 Essential (primary) hypertension: Secondary | ICD-10-CM

## 2017-02-20 DIAGNOSIS — I2510 Atherosclerotic heart disease of native coronary artery without angina pectoris: Secondary | ICD-10-CM | POA: Diagnosis not present

## 2017-02-20 DIAGNOSIS — D126 Benign neoplasm of colon, unspecified: Secondary | ICD-10-CM

## 2017-02-20 DIAGNOSIS — I480 Paroxysmal atrial fibrillation: Secondary | ICD-10-CM | POA: Diagnosis not present

## 2017-02-20 DIAGNOSIS — I5022 Chronic systolic (congestive) heart failure: Secondary | ICD-10-CM | POA: Diagnosis not present

## 2017-02-20 DIAGNOSIS — K922 Gastrointestinal hemorrhage, unspecified: Secondary | ICD-10-CM

## 2017-02-20 DIAGNOSIS — Z860101 Personal history of adenomatous and serrated colon polyps: Secondary | ICD-10-CM | POA: Insufficient documentation

## 2017-02-20 DIAGNOSIS — K921 Melena: Secondary | ICD-10-CM | POA: Diagnosis not present

## 2017-02-20 HISTORY — DX: Gastrointestinal hemorrhage, unspecified: K92.2

## 2017-02-20 HISTORY — DX: Personal history of colonic polyps: Z86.010

## 2017-02-20 HISTORY — DX: Personal history of adenomatous and serrated colon polyps: Z86.0101

## 2017-02-20 HISTORY — PX: FLEXIBLE SIGMOIDOSCOPY: SHX5431

## 2017-02-20 LAB — CBC
HEMATOCRIT: 28.8 % — AB (ref 39.0–52.0)
HEMOGLOBIN: 9.6 g/dL — AB (ref 13.0–17.0)
MCH: 30.2 pg (ref 26.0–34.0)
MCHC: 33.3 g/dL (ref 30.0–36.0)
MCV: 90.6 fL (ref 78.0–100.0)
Platelets: 119 10*3/uL — ABNORMAL LOW (ref 150–400)
RBC: 3.18 MIL/uL — AB (ref 4.22–5.81)
RDW: 13.5 % (ref 11.5–15.5)
WBC: 4.9 10*3/uL (ref 4.0–10.5)

## 2017-02-20 LAB — GLUCOSE, CAPILLARY
GLUCOSE-CAPILLARY: 43 mg/dL — AB (ref 65–99)
GLUCOSE-CAPILLARY: 54 mg/dL — AB (ref 65–99)
Glucose-Capillary: 98 mg/dL (ref 65–99)

## 2017-02-20 LAB — BASIC METABOLIC PANEL
ANION GAP: 8 (ref 5–15)
BUN: 23 mg/dL — ABNORMAL HIGH (ref 6–20)
CO2: 20 mmol/L — AB (ref 22–32)
Calcium: 8.5 mg/dL — ABNORMAL LOW (ref 8.9–10.3)
Chloride: 109 mmol/L (ref 101–111)
Creatinine, Ser: 0.98 mg/dL (ref 0.61–1.24)
GFR calc Af Amer: >60 mL/min (ref >60)
GFR calc non Af Amer: >60 mL/min (ref >60)
Glucose, Bld: 65 mg/dL (ref 65–99)
POTASSIUM: 4.8 mmol/L (ref 3.5–5.1)
Sodium: 137 mmol/L (ref 135–145)

## 2017-02-20 LAB — HEMOGLOBIN AND HEMATOCRIT, BLOOD
HEMATOCRIT: 30.4 % — AB (ref 39.0–52.0)
HEMATOCRIT: 26.1 % — AB (ref 39.0–52.0)
Hemoglobin: 9.6 g/dL — ABNORMAL LOW (ref 13.0–17.0)
Hemoglobin: 8.3 g/dL — ABNORMAL LOW (ref 13.0–17.0)

## 2017-02-20 SURGERY — SIGMOIDOSCOPY, FLEXIBLE
Anesthesia: Moderate Sedation

## 2017-02-20 MED ORDER — DEXTROSE 50 % IV SOLN
INTRAVENOUS | Status: AC
  Administered 2017-02-20: 50 mL
  Filled 2017-02-20: qty 50

## 2017-02-20 MED ORDER — MIDAZOLAM HCL 10 MG/2ML IJ SOLN
INTRAMUSCULAR | Status: DC | PRN
  Administered 2017-02-20: 1 mg via INTRAVENOUS
  Administered 2017-02-20: 2 mg via INTRAVENOUS
  Administered 2017-02-20: 1 mg via INTRAVENOUS
  Administered 2017-02-20: 2 mg via INTRAVENOUS
  Administered 2017-02-20: 1 mg via INTRAVENOUS

## 2017-02-20 MED ORDER — FENTANYL CITRATE (PF) 100 MCG/2ML IJ SOLN
INTRAMUSCULAR | Status: DC | PRN
  Administered 2017-02-20: 25 ug via INTRAVENOUS
  Administered 2017-02-20: 12.5 ug via INTRAVENOUS
  Administered 2017-02-20: 25 ug via INTRAVENOUS
  Administered 2017-02-20: 12.5 ug via INTRAVENOUS

## 2017-02-20 MED ORDER — FENTANYL CITRATE (PF) 100 MCG/2ML IJ SOLN
INTRAMUSCULAR | Status: AC
  Filled 2017-02-20: qty 2

## 2017-02-20 MED ORDER — GLUCOSE 40 % PO GEL: 1.0000 | Freq: Once | ORAL | Status: DC | PRN

## 2017-02-20 MED ORDER — MIDAZOLAM HCL 5 MG/ML IJ SOLN
INTRAMUSCULAR | Status: AC
  Filled 2017-02-20: qty 2

## 2017-02-20 MED ORDER — ENSURE ENLIVE PO LIQD
237.0000 mL | Freq: Two times a day (BID) | ORAL | Status: DC
  Administered 2017-02-21 – 2017-02-25 (×4): 237 mL via ORAL

## 2017-02-20 MED ORDER — GLUCOSE 40 % PO GEL
ORAL | Status: AC
  Administered 2017-02-20: 37.5 g
  Filled 2017-02-20: qty 1

## 2017-02-20 MED ORDER — DEXTROSE 50 % IV SOLN: 25.0000 mL | INTRAVENOUS | Status: DC | PRN

## 2017-02-20 NOTE — Interval H&P Note (Signed)
History and Physical Interval Note: No new events Tolerated the prep. Hgb is stable Flex sig planned for probable clipping to recent polypectomy site in sigmoid colon The nature of the procedure, as well as the risks, benefits, and alternatives were carefully and thoroughly reviewed with the patient. Ample time for discussion and questions allowed. The patient understood, was satisfied, and agreed to proceed.     02/20/2017 2:48 PM  [facility].  has presented today for surgery, with the diagnosis of Painless hematochezia since colonoscopy 02/13/17. Blood loss anemia.  The various methods of treatment have been discussed with the patient and family. After consideration of risks, benefits and other options for treatment, the patient has consented to  Procedure(s): FLEXIBLE SIGMOIDOSCOPY (N/A) as a surgical intervention .  The patient's history has been reviewed, patient examined, no change in status, stable for surgery.  I have reviewed the patient's chart and labs.  Questions were answered to the patient's satisfaction.     [place] M

## 2017-02-20 NOTE — Care Management Obs Status (Signed)
[place] NOTIFICATION   Patient Details  Name: [facility]. MRN: [id number] Date of Birth: [birth year]   Medicare Observation Status Notification Given:  Yes    Jatin Naumann, Rory Percy, RN 02/20/2017, 11:05 AM

## 2017-02-20 NOTE — H&P (View-Only) (Signed)
[facility] Consult: 3:45 PM 02/19/2017  LOS: 0 days    Referring Provider: Dr Ellender Hose  Primary Care Physician:  [name], MD Primary Gastroenterologist:  Dr. Carlean Purl     Reason for Consultation:  Hematochezia post colon polypectomy   HPI: Louis Ford. is a 71 y.o. male.  PMH Obesity. CAD.  S/p CABG. A fib. Chronic Coumadin.  CHF (EF 55 to 60 %in 02/2016 but as low as 15% in past). ICD placed 2006.  CVA. Seizures.    Factor 7 deficiency.  HTN. Dental caries and oral pain.  Intermittent thrombocytopenia dates to 2013 (101K in 2015)     02/13/17 Colonoscopy for rectal bleeding with polypectomies.  3 cm sigmoid polyp hot snared and injected with epi to prevent bleeding (path TVA). 2 tiny polyps (path TAs) in transverse colon were cold resected.  Severe sigmoid tics a/w colon narrowing.   Coumadin, along with 5-6 days of Lovenox, resumed on 2/22. That same day he began having hematochezia. There was no abdominal pain, anorexia or nausea and vomiting associated with this. Initial episode of hematochezia occurred at night and he sought on his bedclothes the next day. Thereafter he would feel like he needed to have a p.m. and ended up passing blood. The blood was moderate volume it would sit down at the bottom of the commode.He contacted the GI office on 02/15/17 and was advised to stop both the Coumadin and Lovenox. He was advised that if he did not stop bleeding he should come to the ED as Dr. Carlean Purl would need to perform flexible sigmoidoscopy and Endo clipping for what was presumed to be a post polypectomy bleed.  The bleeding has continued but he did not seek medical attention. Today he went for Coumadin clinic follow-up and provided history of ongoing hematochezia. They told him he needed to come to the  ED.  Hgb 10.6 today, was 15.3 on 01/07/17.  INR 1.1.  BUN normal.  Patient denies lightheadedness, presyncope, chest pain, shortness of breath, swelling. He has not been using any aspirin or nonsteroidal products. Patient denies any other type of bleeding besides the hematochezia. I.e. no oral or nasal bleeding no excessive bruising or skin bleeding. For knee pain from a fall he took several weeks ago, he is taking tramadol and acetaminophen. Within the past few weeks orthopedic M.D. has drawn fluid off the knee and injected the knee with steroid.   Past Medical History:  Diagnosis Date  . Atrial fibrillation   09/22/2012  . CAD (coronary artery disease)   . Chronic systolic heart failure ([place])   . 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 History of MRSA infection 05/2011  . HTN (hypertension)   . Hyperlipidemia   . implantable cardiac defibrillator-Biotronik    Device Implanted 2006; s/p gen change 03/2011 : bleeding persistent with pocket erosion and infection; explant and reimplant  06/2011  . Ischemic cardiomyopathy    EF 15 to 20% by TTE and TEE in 09/2012.  Severe LV dysfunction  .  Obesity    BMI 31 in 09/2012  . Seizure disorder ([place]) latest 09/30/2012  . Seizures ([place])   . Stroke [facility])     Past Surgical History:  Procedure Laterality Date  . CARDIAC CATHETERIZATION N/A 10/12/2015   Procedure: Right Heart Cath;  Surgeon: [name], MD;  Location: [place] [facility];  Service: Cardiovascular;  Laterality: N/A;  . CARDIOVERSION  09/24/2012   Procedure: CARDIOVERSION;  Surgeon: [name], MD;  Location: [facility] ENDOSCOPY;  Service: Cardiovascular;  Laterality: N/A;  . CORONARY ARTERY BYPASS GRAFT    . ICD    . TEE WITHOUT CARDIOVERSION  09/24/2012   Procedure: TRANSESOPHAGEAL ECHOCARDIOGRAM (TEE);  Surgeon: [name], MD;  Location: [facility] ENDOSCOPY;  Service: Cardiovascular;  Laterality: N/A;  dave/anesth, dl, cindy/echo      Prior to Admission medications   Medication Sig Start Date End Date Taking? Authorizing Provider  acetaminophen (TYLENOL) 325 MG tablet Take 650 mg by mouth every 6 (six) hours as needed for mild pain.    Historical Provider, MD  amiodarone (PACERONE) 200 MG tablet Take 1 tablet (200 mg total) by mouth daily. 08/24/16   [name], MD  amoxicillin (AMOXIL) 500 MG capsule Take 1 capsule (500 mg total) by mouth 3 (three) times daily. 10/26/16   [name], MD  Ascorbic Acid (VITAMIN C) 100 MG tablet Take 100 mg by mouth daily.    Historical Provider, MD  BIDIL 20-37.5 MG tablet TAKE TWO TABLETS BY MOUTH THREE TIMES DAILY 11/19/16   Shirley Friar, PA-C  carvedilol (COREG) 3.125 MG tablet Take 1 tablet (3.125 mg total) by mouth 2 (two) times daily. 02/04/17   [name], MD  enoxaparin (LOVENOX) 100 MG/ML injection Inject 1 mL (100 mg total) into the skin every 12 (twelve) hours. As instructed by Coumadin Clinic 02/04/17   [name], MD  furosemide (LASIX) 40 MG tablet Take 1 tablet (40 mg total) by mouth daily. 07/24/16   [name], MD  HYDROcodone-acetaminophen (NORCO/VICODIN) 5-325 MG tablet Take 2 tablets by mouth every 4 (four) hours as needed. 01/11/17   Lysbeth Penner, FNP  isosorbide-hydrALAZINE (BIDIL) 20-37.5 MG tablet Take 2 tablets by mouth 3 (three) times daily. 06/19/16   Shirley Friar, PA-C  losartan (COZAAR) 25 MG tablet Take 1 tablet (25 mg total) by mouth daily. 03/13/16   Shirley Friar, PA-C  MULTIPLE VITAMIN PO Take by mouth daily.    Historical Provider, MD  nystatin (MYCOSTATIN) 100000 UNIT/ML suspension SWISH, GARGLE AND SPIT 5 TO 10 ML BY MOUTH EVERY 6 HOURS AS NEEDED 01/07/17   [name], MD  phenytoin (DILANTIN) 100 MG ER capsule Take 1 capsule (100 mg total) by mouth 2 (two) times daily. 08/24/16   [name], MD  predniSONE (DELTASONE) 10 MG tablet 2 tabs by mouth per day for 5 days 12/13/16    Biagio Borg, MD  simvastatin (ZOCOR) 20 MG tablet Take 1 tablet (20 mg total) by mouth daily at 6 PM. 04/02/16   [name], MD  spironolactone (ALDACTONE) 25 MG tablet Take 0.5 tablets (12.5 mg total) by mouth daily. 09/11/16   [name], MD  traMADol (ULTRAM) 50 MG tablet Take 1 tablet (50 mg total) by mouth every 8 (eight) hours as needed. 01/22/17   [name], MD  triamcinolone cream (KENALOG) 0.1 % Apply 1 application topically 2 (two) times daily. 08/16/16   [name],  MD  warfarin (COUMADIN) 4 MG tablet Take as directed by Coumadin Clinic 12/26/16   [name], MD    Scheduled Meds:  Infusions:  PRN Meds:    Allergies as of 02/19/2017  . (No Known Allergies)    Family History  Problem Relation Age of Onset  . Arthritis Mother   . Heart disease Mother   . Heart attack Mother   . Other Father     smoker  . Hypertension Neg Hx     unknown  . Stroke Neg Hx     unknown    Social History   Social History  . Marital status: Single    Spouse name: N/A  . Number of children: N/A  . Years of education: N/A   Occupational History  . Not on file.   Social History Main Topics  . Smoking status: Former Smoker    Types: Pipe    Quit date: 09/21/2008  . Smokeless tobacco: Former Systems developer    Quit date: 09/21/2008  . Alcohol use No  . Drug use: No  . Sexual activity: No   Other Topics Concern  . Not on file   Social History Narrative   ** Merged History Encounter **        REVIEW OF SYSTEMS: Constitutional:  Feels well, perhaps a bit tired. ENT:  No nose bleeds.  Patient has had pain in his upper left jaw from dental disease for several weeks. Pulm:  No shortness of breath or cough. CV:  No palpitations, no LE edema. No chest pain. GU:  No hematuria, no frequency GI:  Per HPI Heme:  Per HPI   Transfusions:  Do not find records of previous blood transfusions in Epic. Neuro:  No headaches, no peripheral tingling or  numbness Derm:  No itching, no rash or sores.  Endocrine:  No sweats or chills.  No polyuria or dysuria Immunization:  His flu, Pneumovax vaccinations are up-to-date. Travel:  None beyond local counties in last few months.    PHYSICAL EXAM: Vital signs in last 24 hours: Vitals:   02/19/17 1442  BP: (!) 137/117  Pulse: 86  Resp: 16  Temp: 97.4 F (36.3 C)   Wt Readings from Last 3 Encounters:  02/13/17 97.5 kg (215 lb)  01/28/17 97.5 kg (215 lb)  01/22/17 97.5 kg (215 lb)    General: Pleasant, well-appearing AAM wo is comfortable. Head:  No facial asymmetry or swelling.  Eyes:  No scleral icterus, no conjunctival pallor. Ears:  Not HOH.  Nose:  No discharge or congestion. Mouth:  Patient has a large wad of facial tissue pushed up against his upper left jaw. This was removed. There is no visible blood. There are some caries ridden nubs of teeth Neck:  No JVD, thyromegaly or masses.  Scar at base of neck, looks like it came from a tracheostomy though the patient denies previous tracheostomy. Lungs:  Unlabored breathing. No cough. Lungs clear to auscultation and percussion bilaterally. Heart:  Irregularly irregular rhythm. Rate is not bradycardic or accelerated. No MRG.  ICD visible/palpable in the upper right chest. Abdomen:  Soft. Active bowel sounds.  NT, ND.  No masses or hepatosplenomegaly. No bruits, no hernias..   Rectal:  Deferred rectal exam.   Musc/Skeltl:  Swelling and a small bruise on the left knee. Extremities:  No CCE.  Neurologic:  Alert. Oriented times 3. No tremor. No limb weakness, moves all 4 limbs. Skin:  No rashes, no sores, no suspicious lesions  Tattoos:  None seen. Nodes:  No cervical adenopathy.   Psych:  Calm, pleasant, cooperative.  Intake/Output from previous day: No intake/output data recorded. Intake/Output this shift: No intake/output data recorded.  LAB RESULTS:  Recent Labs  02/19/17 1457  WBC 5.3  HGB 10.6*  HCT 33.4*  PLT 127*    BMET Lab Results  Component Value Date   NA 137 12/13/2016   NA 138 11/08/2016   NA 137 10/25/2016   K 5.2 (H) 12/13/2016   K 4.5 11/08/2016   K 4.7 10/25/2016   CL 103 12/13/2016   CL 106 11/08/2016   CL 103 10/25/2016   CO2 31 12/13/2016   CO2 26 11/08/2016   CO2 25 10/25/2016   GLUCOSE 77 12/13/2016   GLUCOSE 74 11/08/2016   GLUCOSE 80 10/25/2016   BUN 23 12/13/2016   BUN 19 11/08/2016   BUN 21 (H) 10/25/2016   CREATININE 1.24 12/13/2016   CREATININE 1.14 11/08/2016   CREATININE 1.19 10/25/2016   CALCIUM 9.1 12/13/2016   CALCIUM 8.7 (L) 11/08/2016   CALCIUM 8.9 10/25/2016   LFT No results for input(s): PROT, ALBUMIN, AST, ALT, ALKPHOS, BILITOT, BILIDIR, IBILI in the last 72 hours. PT/INR Lab Results  Component Value Date   INR 1.1 02/19/2017   INR 1.6 02/04/2017   INR 2.3 01/04/2017   PROTIME 13.3 (H) 10/03/2012   Hepatitis Panel No results for input(s): HEPBSAG, HCVAB, HEPAIGM, HEPBIGM in the last 72 hours. C-Diff No components found for: CDIFF Lipase  No results found for: LIPASE  Drugs of Abuse     Component Value Date/Time   LABOPIA NONE DETECTED 09/21/2012 0836   COCAINSCRNUR NONE DETECTED 09/21/2012 0836   LABBENZ NONE DETECTED 09/21/2012 0836   AMPHETMU NONE DETECTED 09/21/2012 0836   THCU NONE DETECTED 09/21/2012 0836   LABBARB NONE DETECTED 09/21/2012 0836     RADIOLOGY STUDIES: No results found.   IMPRESSION:   *  Bleeding post colon polypectomy.  Presume this is a post polypectomy bleed though possibly it could be a stuttering diverticular bleed given the dense left-sided diverticulosis seen on colonoscopy.  *  Blood loss anemia.  So far this is asymptomatic.  *  Chronic Coumadin for Afib.  Resumed post colonoscopy.  INR subtherapeutic.   *  Chronic intermittent thrombocytopenia.     PLAN:     *  Patient can have clears tonight. Will give split dose movie prep beginning this evening, complete this tomorrow and plan flex sig  tomorrow afternoon.   Follow Hgb   Azucena Freed  02/19/2017, 3:45 PM Pager: [phone]

## 2017-02-20 NOTE — Progress Notes (Signed)
PROGRESS NOTE    [facility].  WY:[id number] DOB: March 18, 1946 DOA: 02/19/2017 PCP: [name], MD    Brief Narrative: Louis Dandy. is a 71 y.o. male with a medical history of atrial fibrillation, hypertension, coronary artery disease, history of ischemic cardiomyopathy and systolic heart failure, seizure disorder, who presented to the emergency department with complaints of rectal bleeding. Patient had been having rectal bleeding profusely one month at which point he did see gastroenterologist. Patient is status post colonoscopy on 02/13/2017, with polypectomy. Since that time, he has been bleeding. He has not been on Lovenox or Coumadin since 02/14/2017. His hemoglobin dropped from baseline of 15 to 10.8 to 9.8. GI consulted and plan for flex sigmoidoscopy on 2/28   Assessment & Plan:   Active Problems:   HTN (hypertension)   Coronary artery disease involving native heart   Ischemic cardiomyopathy  ? additional rate component   Seizure (HCC)   Chronic systolic heart failure (HCC)   Afib (HCC)   Hematochezia   GIB (gastrointestinal bleeding)   Acute GI bleeding, suspect lower GI bleeding: -S/p colonoscopy with polypectomy on 02/13/2017: Left-sided diverticulosis -Patient has been on lovenox (not since 02/14/2017) and coumadin (held since 02/07/2017) and currently INR 1.29 No signs of abdominal pain or discomfort, but continues to have rectal bleeding. Transfuse to keep hemoglobin greater than 9. GI consulted and plan for flex sigmoidoscopy on 2.28.   Atrial fibrillation:  Rate controlled. Subtherapeutic INR at 1.29. CHADVASC score of 5.  Anticoagulation held for now.    Hypertension: bp borderline on admission, better this am. Holding meds at this time.   Hyperlipidemia -Continue statin  Seizure disorder -Continue Dilantin  History of coronary artery disease/ischemic cardiomyopathy -EF in 2013 was 15-20%. Has ICD placed. -Currently chest pain free, and  euvolemic -Given current GI bleed, will hold patient's Coreg, BiDil, Lasix, losartan, spironolactone -Monitor intake and output, daily weights, restart meds slowly after GI bleeding improves.  -Last echocardiogram 02/27/2016 showed an EF of 0000000, grade 2 diastolic dysfunction  Mild thrombocytopenia: counts around 127 to 119. Monitor.     DVT prophylaxis: scd's Code Status: DNR Family Communication: none at bedside.  Disposition Plan: PENDING further evaluation.    Consultants:   gi  Procedures:  Flex sig on 2/28  Antimicrobials: none.    Subjective: No complaints of abdominal pain or nausea or vomiting.  Continues to have rectal bleeding.   Objective: Vitals:   02/19/17 1700 02/19/17 1930 02/19/17 2057 02/20/17 0421  BP: 130/80 130/88 (!) 107/93 113/88  Pulse: 80 (!) 59 83 78  Resp: 17 17 19 20   Temp:   97.6 F (36.4 C) 98 F (36.7 C)  TempSrc:   Oral Oral  SpO2: 100% 95% 99% 100%  Weight:   89.1 kg (196 lb 6.4 oz)   Height:   5\' 11"  (1.803 m)     Intake/Output Summary (Last 24 hours) at 02/20/17 1405 Last data filed at 02/20/17 1012  Gross per 24 hour  Intake              483 ml  Output              150 ml  Net              333 ml   Filed Weights   02/19/17 2057  Weight: 89.1 kg (196 lb 6.4 oz)    Examination:  General exam: Appears calm and comfortable  Respiratory system: Clear to auscultation. Respiratory effort  normal. Cardiovascular system: S1 & S2 heard,  No JVD, murmurs, rubs, gallops or clicks. No pedal edema. Gastrointestinal system: Abdomen is nondistended, soft and nontender. No organomegaly or masses felt. Normal bowel sounds heard. Central nervous system: Alert and oriented. No focal neurological deficits. Extremities: Symmetric 5 x 5 power.     Data Reviewed: I have personally reviewed following labs and imaging studies  CBC:  Recent Labs Lab 02/19/17 1457 02/19/17 2111 02/20/17 0415 02/20/17 1240  WBC 5.3  --  4.9  --     HGB 10.6* 9.8* 9.6* 9.6*  HCT 33.4* 30.2* 28.8* 30.4*  MCV 92.0  --  90.6  --   PLT 127*  --  119*  --    Basic Metabolic Panel:  Recent Labs Lab 02/19/17 1457 02/20/17 0415  NA 136 137  K 4.6 4.8  CL 108 109  CO2 23 20*  GLUCOSE 95 65  BUN 20 23*  CREATININE 1.08 0.98  CALCIUM 8.6* 8.5*   GFR: Estimated Creatinine Clearance: 76.8 mL/min (by C-G formula based on SCr of 0.98 mg/dL). Liver Function Tests:  Recent Labs Lab 02/19/17 1457  AST 18  ALT 18  ALKPHOS 75  BILITOT 0.5  PROT 6.6  ALBUMIN 3.3*   No results for input(s): LIPASE, AMYLASE in the last 168 hours. No results for input(s): AMMONIA in the last 168 hours. Coagulation Profile:  Recent Labs Lab 02/19/17 1350 02/19/17 2111  INR 1.1 1.29   Cardiac Enzymes: No results for input(s): CKTOTAL, CKMB, CKMBINDEX, TROPONINI in the last 168 hours. BNP (last 3 results) No results for input(s): PROBNP in the last 8760 hours. HbA1C: No results for input(s): HGBA1C in the last 72 hours. CBG: No results for input(s): GLUCAP in the last 168 hours. Lipid Profile: No results for input(s): CHOL, HDL, LDLCALC, TRIG, CHOLHDL, LDLDIRECT in the last 72 hours. Thyroid Function Tests: No results for input(s): TSH, T4TOTAL, FREET4, T3FREE, THYROIDAB in the last 72 hours. Anemia Panel: No results for input(s): VITAMINB12, FOLATE, FERRITIN, TIBC, IRON, RETICCTPCT in the last 72 hours. Sepsis Labs:  Recent Labs Lab 02/19/17 1517  LATICACIDVEN 0.83    No results found for this or any previous visit (from the past 240 hour(s)).       Radiology Studies: No results found.      Scheduled Meds: . amiodarone  200 mg Oral Daily  . phenytoin  100 mg Oral BID  . simvastatin  20 mg Oral q1800  . sodium chloride flush  3 mL Intravenous Q12H  . triamcinolone cream  1 application Topical BID   Continuous Infusions: . sodium chloride       LOS: 0 days    Time spent: 30 min    Louis Kilgore, MD [facility] Pager [phone]  If 7PM-7AM, please contact night-coverage [email or web address] Password [email or web address] 02/20/2017, 2:05 PM

## 2017-02-20 NOTE — Progress Notes (Addendum)
Pt arrived from procedure around 1700. Pt was alert and oriented x 4. Denied any pain.   Around 1741 the NT noticed that the pt had some slurred speech. Upon assessment the pt had some left arm deviation, tongue was drifting to the left.   Called RRT for a second opinion.   CBG was 54 - given ginger ale.   Rechecked and it was 43.   See MAR.   1800 MD Karleen Hampshire was notified   Will continue to monitor    1845 CBG 98

## 2017-02-20 NOTE — Op Note (Signed)
[facility] Patient Name: Louis Ford Procedure Date : 02/20/2017 MRN: [id number] Attending MD: [name] , MD Date of Birth: March 18, 1946 CSN: [id number] Age: 71 Admit Type: Inpatient Procedure:                Flexible Sigmoidoscopy Indications:              Hematochezia, For therapy of adenomatous polyps in                            the colon Providers:                Lajuan Lines. Hilarie Fredrickson, MD, Burtis Junes, RN, Corliss Parish,                            Technician Referring MD:             [facility] Medicines:                Fentanyl 75 micrograms IV, Midazolam 7 mg IV Complications:            No immediate complications. Estimated Blood Loss:     Estimated blood loss was minimal. Procedure:                Pre-Anesthesia Assessment:                           - Prior to the procedure, a History and Physical                            was performed, and patient medications and                            allergies were reviewed. The patient's tolerance of                            previous anesthesia was also reviewed. The risks                            and benefits of the procedure and the sedation                            options and risks were discussed with the patient.                            All questions were answered, and informed consent                            was obtained. Prior Anticoagulants: The patient has                            taken Lovenox (enoxaparin), last dose was 2 days                            prior to procedure. ASA Grade Assessment: III - A  patient with severe systemic disease. After                            reviewing the risks and benefits, the patient was                            deemed in satisfactory condition to undergo the                            procedure.                           After obtaining informed consent, the scope was                            passed under direct  vision. The [id number]                            scope was introduced through the anus and advanced                            to the the sigmoid colon. The flexible                            sigmoidoscopy was performed with difficulty due to                            multiple diverticula in the colon, poor endoscopic                            visualization and restricted mobility of the colon.                            The patient tolerated the procedure well. The                            quality of the bowel preparation was fair. Scope In: Scope Out: Findings:      The digital rectal exam was normal.      Red blood and hematin material was found in the rectum and in the       sigmoid colon.      A large, bleeding post-polypectomy stalk was found in the sigmoid colon.       There was residual stalk with ulceration, clot and active bleeding at       the tip. An endoloop was attempted, but unable achieve correct position       for hemostasis, therefore it was removed and not deployed. To stop       active bleeding, four hemostatic clips were successful and one       hemostatic clip was unsuccessfully placed (MR conditional). There was no       active bleeding seen at the end of the procedure. Impression:               - Blood in the rectum and in the sigmoid colon.                           -  Large, post-polypectomy stalk with ulcer, clot                            and active bleeding in the sigmoid colon. Clips (MR                            conditional) were placed with apparent good                            hemostasis.                           - No specimens collected. Moderate Sedation:      Moderate (conscious) sedation was administered by the endoscopy nurse       and supervised by the endoscopist. The following parameters were       monitored: oxygen saturation, heart rate, blood pressure, and response       to care. Total physician intraservice time was 78  minutes. Recommendation:           - Return patient to hospital ward for ongoing care,                            would recommend observation an additional night.                           - Full liquid diet today.                           - Continue present medications. Would not resume                            anticoagulation tonight.                           - W Procedure Code(s):        --- Professional ---                           [phone], Sigmoidoscopy, flexible; with control of                            bleeding, any method                           99153, Moderate sedation services; each additional                            15 minutes intraservice time                           99153, Moderate sedation services; each additional                            15 minutes intraservice time                           99153, Moderate sedation services; each  additional                            15 minutes intraservice time                           99153, Moderate sedation services; each additional                            15 minutes intraservice time                           G0500, Moderate sedation services provided by the                            same physician or other qualified health care                            professional performing a gastrointestinal                            endoscopic service that sedation supports,                            requiring the presence of an independent trained                            observer to assist in the monitoring of the                            patient's level of consciousness and physiological                            status; initial 15 minutes of intra-service time;                            patient age 31 years or older (additional time may                            be reported with [phone], as appropriate) Diagnosis Code(s):        --- Professional ---                           K62.5, Hemorrhage of anus and rectum                            K92.2, Gastrointestinal hemorrhage, unspecified                           D12.5, Benign neoplasm of sigmoid colon                           K92.1, Melena (includes Hematochezia)                           D12.6, Benign neoplasm of colon, unspecified CPT  copyright 2016 American Medical Association. All rights reserved. The codes documented in this report are preliminary and upon coder review may  be revised to meet current compliance requirements. [name], MD 02/20/2017 4:38:20 PM This report has been signed electronically. Number of Addenda: 0

## 2017-02-20 NOTE — Consult Note (Signed)
   [facility] Inpatient Consult   02/20/2017  [facility]. 08-27-1946 [id number]    Received notification of hospitalization from [place]. Please refer to patient outreach notes for further details.  Mr. Louis Ford is currently off the unit. Will follow up at later time.   Marthenia Rolling, MSN-Ed, RN,BSN [facility] Liaison [phone]

## 2017-02-20 NOTE — Significant Event (Signed)
Rapid Response Event Note  Overview:  Called to see secondary to neuro changes Time Called: 1742 Arrival Time: 1747 Event Type: Neurologic  Initial Focused Assessment:  On arrival patient sitting up in bed - alert - warm and dry - follows commands - answers questions appropriately - oriented - denies pain - no headache - speech is slightly slurred - patient hesitant to smile due to poor dention - patient has wad of napkin in left cheek - he states it helps with he tooth pain.  No other focal neuro signs.  Of note patient recently back from sigmoidoscopy test - had 7 mg Versed and 75 mc Fentanyl.  CBG resulted 51.  Given some ginger ale per staff. SR on monitor.  BP 91/61 O2 sat 100 on RA.  Resps regular and unlabored.    Interventions:  Neuro exam - Glucose gel given per protocol - CBG now 43 - patient without change - 1/2 amp D50 IV given.  Patient speech clearing - family and staff report it is now normal.  Patient sitting up with liquid diet.  Bp 114/70 HR 72 RR 16 O2 sats 100%.  Staff to repeat blood sugar.  Dr. Karleen Hampshire aware.    Plan of Care (if not transferred):  Monitor blood sugar and neuro status every one hour x2 - call as needed.    Event Summary: Name of Physician Notified: Dr. Karleen Hampshire at      at    Outcome: Stayed in room and stabalized  Event End Time: 1825  Quin Hoop

## 2017-02-20 NOTE — Patient Outreach (Addendum)
[facility]) Care Management  02/20/2017  [place]. October 27, 1946 [id number]  REFERRAL DATE 02/15/17 REFERRAL SOURCE: Nurse calll line REFERRAL REASON:  Wife request referral for mental health assistance.   Telephone call to patient regarding nurse call line referral. HIPAA verified with patient. Patient states he has been admitted to the hospital.   PLAN;  RNCM referred patient to [facility]  hospital liaisons to follow up on patients discharge disposition.  RNCM notified care management assistant of patients hospital admission.    Quinn Plowman RN,BSN,CCM [facility] Telephonic  [phone]

## 2017-02-20 NOTE — Progress Notes (Signed)
Patient arrived to unit Louis Ford bed 26 from emergency department.Assisted to bed by nursing staff.Patient alert and oriented  X 4 denies pain or discomfort at present time.Patient placed on telemetry .Oriented patient to nursing unit ,call bell,fall risk policy.Patient admission deferred to next shift when patient wife comes to help answer questions.Patient does not want to sign consent form until wife comes in morning.

## 2017-02-21 ENCOUNTER — Encounter (HOSPITAL_COMMUNITY)
Admission: EM | Disposition: A | Discharge status: Home / Self Care | Payer: Self-pay | Source: Home / Self Care | Attending: Internal Medicine

## 2017-02-21 ENCOUNTER — Encounter (HOSPITAL_COMMUNITY): Payer: Self-pay | Admitting: *Deleted

## 2017-02-21 DIAGNOSIS — E785 Hyperlipidemia, unspecified: Secondary | ICD-10-CM | POA: Diagnosis present

## 2017-02-21 DIAGNOSIS — D500 Iron deficiency anemia secondary to blood loss (chronic): Secondary | ICD-10-CM | POA: Diagnosis not present

## 2017-02-21 DIAGNOSIS — D62 Acute posthemorrhagic anemia: Secondary | ICD-10-CM

## 2017-02-21 DIAGNOSIS — I2510 Atherosclerotic heart disease of native coronary artery without angina pectoris: Secondary | ICD-10-CM | POA: Diagnosis not present

## 2017-02-21 DIAGNOSIS — I5022 Chronic systolic (congestive) heart failure: Secondary | ICD-10-CM | POA: Diagnosis not present

## 2017-02-21 DIAGNOSIS — K922 Gastrointestinal hemorrhage, unspecified: Secondary | ICD-10-CM | POA: Diagnosis not present

## 2017-02-21 DIAGNOSIS — Z86010 Personal history of colonic polyps: Secondary | ICD-10-CM | POA: Diagnosis not present

## 2017-02-21 DIAGNOSIS — I110 Hypertensive heart disease with heart failure: Secondary | ICD-10-CM | POA: Diagnosis present

## 2017-02-21 DIAGNOSIS — Z95810 Presence of automatic (implantable) cardiac defibrillator: Secondary | ICD-10-CM | POA: Diagnosis not present

## 2017-02-21 DIAGNOSIS — I480 Paroxysmal atrial fibrillation: Secondary | ICD-10-CM

## 2017-02-21 DIAGNOSIS — K921 Melena: Secondary | ICD-10-CM | POA: Diagnosis not present

## 2017-02-21 DIAGNOSIS — I10 Essential (primary) hypertension: Secondary | ICD-10-CM | POA: Diagnosis not present

## 2017-02-21 DIAGNOSIS — Z8261 Family history of arthritis: Secondary | ICD-10-CM | POA: Diagnosis not present

## 2017-02-21 DIAGNOSIS — Z66 Do not resuscitate: Secondary | ICD-10-CM | POA: Diagnosis present

## 2017-02-21 DIAGNOSIS — Z8614 Personal history of Methicillin resistant Staphylococcus aureus infection: Secondary | ICD-10-CM | POA: Diagnosis not present

## 2017-02-21 DIAGNOSIS — K91840 Postprocedural hemorrhage and hematoma of a digestive system organ or structure following a digestive system procedure: Secondary | ICD-10-CM | POA: Diagnosis present

## 2017-02-21 DIAGNOSIS — R569 Unspecified convulsions: Secondary | ICD-10-CM | POA: Diagnosis not present

## 2017-02-21 DIAGNOSIS — D125 Benign neoplasm of sigmoid colon: Secondary | ICD-10-CM

## 2017-02-21 DIAGNOSIS — K5730 Diverticulosis of large intestine without perforation or abscess without bleeding: Secondary | ICD-10-CM | POA: Diagnosis not present

## 2017-02-21 DIAGNOSIS — Z9889 Other specified postprocedural states: Secondary | ICD-10-CM | POA: Diagnosis not present

## 2017-02-21 DIAGNOSIS — G40909 Epilepsy, unspecified, not intractable, without status epilepticus: Secondary | ICD-10-CM | POA: Diagnosis present

## 2017-02-21 DIAGNOSIS — Z8249 Family history of ischemic heart disease and other diseases of the circulatory system: Secondary | ICD-10-CM | POA: Diagnosis not present

## 2017-02-21 DIAGNOSIS — Z951 Presence of aortocoronary bypass graft: Secondary | ICD-10-CM | POA: Diagnosis not present

## 2017-02-21 DIAGNOSIS — Z79899 Other long term (current) drug therapy: Secondary | ICD-10-CM | POA: Diagnosis not present

## 2017-02-21 DIAGNOSIS — I4891 Unspecified atrial fibrillation: Secondary | ICD-10-CM | POA: Diagnosis present

## 2017-02-21 DIAGNOSIS — Z7901 Long term (current) use of anticoagulants: Secondary | ICD-10-CM | POA: Diagnosis not present

## 2017-02-21 DIAGNOSIS — Z87891 Personal history of nicotine dependence: Secondary | ICD-10-CM | POA: Diagnosis not present

## 2017-02-21 DIAGNOSIS — Y838 Other surgical procedures as the cause of abnormal reaction of the patient, or of later complication, without mention of misadventure at the time of the procedure: Secondary | ICD-10-CM | POA: Diagnosis present

## 2017-02-21 DIAGNOSIS — D696 Thrombocytopenia, unspecified: Secondary | ICD-10-CM | POA: Diagnosis present

## 2017-02-21 DIAGNOSIS — Z8673 Personal history of transient ischemic attack (TIA), and cerebral infarction without residual deficits: Secondary | ICD-10-CM | POA: Diagnosis not present

## 2017-02-21 DIAGNOSIS — D682 Hereditary deficiency of other clotting factors: Secondary | ICD-10-CM | POA: Diagnosis present

## 2017-02-21 DIAGNOSIS — M79609 Pain in unspecified limb: Secondary | ICD-10-CM | POA: Diagnosis not present

## 2017-02-21 DIAGNOSIS — I255 Ischemic cardiomyopathy: Secondary | ICD-10-CM | POA: Diagnosis present

## 2017-02-21 HISTORY — PX: FLEXIBLE SIGMOIDOSCOPY: SHX5431

## 2017-02-21 LAB — HEMOGLOBIN AND HEMATOCRIT, BLOOD
HCT: 23.4 % — ABNORMAL LOW (ref 39.0–52.0)
HCT: 21.3 % — ABNORMAL LOW (ref 39.0–52.0)
HEMOGLOBIN: 7 g/dL — AB (ref 13.0–17.0)
Hemoglobin: 7.8 g/dL — ABNORMAL LOW (ref 13.0–17.0)

## 2017-02-21 LAB — GLUCOSE, CAPILLARY
GLUCOSE-CAPILLARY: 76 mg/dL (ref 65–99)
GLUCOSE-CAPILLARY: 93 mg/dL (ref 65–99)
Glucose-Capillary: 104 mg/dL — ABNORMAL HIGH (ref 65–99)

## 2017-02-21 LAB — PREPARE RBC (CROSSMATCH)

## 2017-02-21 SURGERY — SIGMOIDOSCOPY, FLEXIBLE
Anesthesia: Moderate Sedation

## 2017-02-21 MED ORDER — FENTANYL CITRATE (PF) 100 MCG/2ML IJ SOLN
INTRAMUSCULAR | Status: DC | PRN
  Administered 2017-02-21: 25 ug via INTRAVENOUS

## 2017-02-21 MED ORDER — MIDAZOLAM HCL 5 MG/ML IJ SOLN
INTRAMUSCULAR | Status: AC
Start: 2017-02-21 — End: 2017-02-21
  Filled 2017-02-21: qty 2

## 2017-02-21 MED ORDER — SODIUM CHLORIDE 0.9 % IV SOLN: INTRAVENOUS | Status: DC

## 2017-02-21 MED ORDER — MIDAZOLAM HCL 10 MG/2ML IJ SOLN
INTRAMUSCULAR | Status: DC | PRN
  Administered 2017-02-21: 2 mg via INTRAVENOUS
  Administered 2017-02-21: 1 mg via INTRAVENOUS

## 2017-02-21 MED ORDER — DIPHENHYDRAMINE HCL 50 MG/ML IJ SOLN
INTRAMUSCULAR | Status: AC
  Filled 2017-02-21: qty 1

## 2017-02-21 MED ORDER — SODIUM CHLORIDE 0.9 % IV SOLN
Freq: Once | INTRAVENOUS | Status: AC
  Administered 2017-02-21: 22:00:00 via INTRAVENOUS

## 2017-02-21 MED ORDER — EPINEPHRINE PF 1 MG/10ML IJ SOSY
PREFILLED_SYRINGE | INTRAMUSCULAR | Status: AC
  Filled 2017-02-21: qty 10

## 2017-02-21 MED ORDER — FENTANYL CITRATE (PF) 100 MCG/2ML IJ SOLN
INTRAMUSCULAR | Status: AC
  Filled 2017-02-21: qty 2

## 2017-02-21 NOTE — Progress Notes (Signed)
Pt arrived from PACU alert and orientated x4. CBG 93   Patient tolerating clears and crackers.   Wife at bedside no complaints voiced at this time.   Will continue to monitor.

## 2017-02-21 NOTE — Progress Notes (Signed)
Initial Nutrition Assessment  DOCUMENTATION CODES:   Not applicable  INTERVENTION:  Once diet advances, continue Ensure Enlive po BID, each supplement provides 350 kcal and 20 grams of protein.  NUTRITION DIAGNOSIS:   Increased nutrient needs related to chronic illness as evidenced by estimated needs.  GOAL:   Patient will meet greater than or equal to 90% of their needs  MONITOR:   Supplement acceptance, Diet advancement, Labs, Weight trends, Skin, I & O's  REASON FOR ASSESSMENT:   Malnutrition Screening Tool    ASSESSMENT:   71 y.o. male with a medical history of atrial fibrillation, hypertension, coronary artery disease, history of ischemic cardiomyopathy and systolic heart failure, seizure disorder, who presented to the emergency department with complaints of rectal bleeding.  Pt is currently NPO for procedure today. Pt reports appetite is fine however unable to finish his breakfast this AM due to need for NPO for anticipation of procedure. Pt reports eating well PTA at home with usual consumption of at least 3 meals a day. Pt with no significant weight loss per weight records. Pt currently has Ensure ordered. RD to continue with current orders. Nursing staff to provide once diet advances.   Nutrition-Focused physical exam completed. Findings are no fat depletion, mild to moderate muscle depletion, and moderate edema.   Labs and medications reviewed.   Diet Order:  Diet NPO time specified  Skin:  Reviewed, no issues  Last BM:  2/28  Height:   Ht Readings from Last 1 Encounters:  02/19/17 5\' 11"  (1.803 m)    Weight:   Wt Readings from Last 1 Encounters:  02/20/17 213 lb 6.5 oz (96.8 kg)    Ideal Body Weight:  78 kg  BMI:  Body mass index is 29.76 kg/m.  Estimated Nutritional Needs:   Kcal:  2100-2300  Protein:  110-120 grams  Fluid:  2.1 - 2.3 L/day  EDUCATION NEEDS:   No education needs identified at this time  Corrin Parker, MS, RD,  LDN Pager # [phone] After hours/ weekend pager # [phone]

## 2017-02-21 NOTE — Consult Note (Signed)
   [facility] Inpatient Consult   02/21/2017  [facility]. 28-Apr-1946 [id number]   [facility] Care Management follow up. Mr. Younkins currently off the unit. Therefore, unable to call into room to discuss [place] Management program. Will attempt at a later time.  Marthenia Rolling, MSN-Ed, RN,BSN [facility] Liaison [phone]

## 2017-02-21 NOTE — Progress Notes (Signed)
          Daily Rounding Note  02/21/2017, 9:43 AM  LOS: 0 days   SUBJECTIVE:   Chief complaint: hematochezia.   Last BM in PM was still bloody but some more dark stool seen.  No BM since then.  Feels well.  Some SOB with minor activity last night, none since.  No chest pain.  Not gotten OOB yet today.  no palpitations.   Tolerating soft diet.  OBJECTIVE:         Vital signs in last 24 hours:    Temp:  [97.8 F (36.6 C)-98.7 F (37.1 C)] 98.6 F (37 C) (03/01 0442) Pulse Rate:  [68-88] 77 (03/01 0442) Resp:  [14-28] 15 (03/01 0442) BP: (88-148)/(58-91) 124/79 (03/01 0442) SpO2:  [95 %-100 %] 95 % (03/01 0442) Weight:  [96.8 kg (213 lb 6.5 oz)] 96.8 kg (213 lb 6.5 oz) (02/28 2100) Last BM Date: 02/20/17 Filed Weights   02/19/17 2057 02/20/17 2100  Weight: 89.1 kg (196 lb 6.4 oz) 96.8 kg (213 lb 6.5 oz)   General: looks well   Heart: Irreg, irreg.  Rate controlled Chest: Clear bilaterally. No cough or dyspnea. Abdomen: Not tender or distended. Active bowel sounds.  Extremities: Slight nonpitting edema in the upper extremities. Neuro/Psych:  Oriented times 3. Slight expressive aphasia as was noted during initial encounter:  I.e. has a little bit of word finding difficulty. Moves all 4 limbs easily. No tremor. No facial asymmetry.  Intake/Output from previous day: 02/28 0701 - 03/01 0700 In: 483 [P.O.:480; I.V.:3] Out: 1025 [Urine:1025]  Intake/Output this shift: No intake/output data recorded.  Lab Results:  Recent Labs  02/19/17 1457  02/20/17 0415 02/20/17 1240 02/20/17 2131  WBC 5.3  --  4.9  --   --   HGB 10.6*  < > 9.6* 9.6* 8.3*  HCT 33.4*  < > 28.8* 30.4* 26.1*  PLT 127*  --  119*  --   --   < > = values in this interval not displayed. BMET  Recent Labs  02/19/17 1457 02/20/17 0415  NA 136 137  K 4.6 4.8  CL 108 109  CO2 23 20*  GLUCOSE 95 65  BUN 20 23*  CREATININE 1.08 0.98  CALCIUM 8.6* 8.5*    LFT  Recent Labs  02/19/17 1457  PROT 6.6  ALBUMIN 3.3*  AST 18  ALT 18  ALKPHOS 75  BILITOT 0.5   PT/INR  Recent Labs  02/19/17 1350 02/19/17 2111  LABPROT  --  16.2*  INR 1.1 1.29   Hepatitis Panel No results for input(s): HEPBSAG, HCVAB, HEPAIGM, HEPBIGM in the last 72 hours.  Studies/Results: No results found.  ASSESMENT:   * post polypectomy GI bleed.  Flex sig 2/28: hemostasis obtained post 4 clips applied to actively bleeding polyp stalk.  *  ABL anemia.  No transfusions to date.   Hgb drop of 2 plus grams since admission.  Hgb down 7 grams from baseline 6 weeks ago.    *  Chronic Coumadin for Afib.  Resumed 2/22 for one dose, then discontinued, post 2/21 colonoscopy.  INR subtherapeutic.   *  Thrombocytopenia.  Non-critical.  Intermittent hx of same dates to 2013.      PLAN   *  CBC in AM.  Patient to remain in hospital for at least another 24 hours. No restart of Coumadin yet.    Azucena Freed  02/21/2017, 9:43 AM Pager: [phone]

## 2017-02-21 NOTE — Progress Notes (Signed)
PROGRESS NOTE    [facility].  [id number] DOB: 03-15-1946 DOA: 02/19/2017 PCP: [name], MD    Brief Narrative: Louis Dandy. is a 71 y.o. male with a medical history of atrial fibrillation, hypertension, coronary artery disease, history of ischemic cardiomyopathy and systolic heart failure, seizure disorder, who presented to the emergency department with complaints of rectal bleeding. Patient had been having rectal bleeding profusely one month at which point he did see gastroenterologist. Patient is status post colonoscopy on 02/13/2017, with polypectomy. Since that time, he has been bleeding. He has not been on Lovenox or Coumadin since 02/14/2017. His hemoglobin dropped from baseline of 15 to 10.8 to 9.8. GI consulted and underwent  flex sigmoidoscopy on 2/28 and on 3/1.    Assessment & Plan:   Active Problems:   HTN (hypertension)   Coronary artery disease involving native heart   Ischemic cardiomyopathy  ? additional rate component   Seizure (HCC)   Chronic systolic heart failure (HCC)   Afib (HCC)   Hematochezia   GIB (gastrointestinal bleeding)   H/O colonoscopy with polypectomy   Acute GI bleeding, suspect lower GI bleeding: -S/p colonoscopy with polypectomy on 02/13/2017: Left-sided diverticulosis -Patient has been on lovenox (not since 02/14/2017) and coumadin (held since 02/07/2017) and currently INR 1.29 No signs of abdominal pain or discomfort, but continues to have rectal bleeding.  GI consulted and plan for flex sigmoidoscopy on 2.28 and on 3/1.  It showed bleeding from the polypectomy stalk and it was clipped for hemostasis and diverticulosis seen.  Recommendations to monitor hemoglobin and to transfuse if hemoglobin is less than 7.    Atrial fibrillation:  Rate controlled. Subtherapeutic INR at 1.29. CHADVASC score of 5.  Anticoagulation held for now.    Hypertension: bp borderline on admission, holding bp meds for now.    Hyperlipidemia -Continue statin  Seizure disorder -Continue Dilantin  History of coronary artery disease/ischemic cardiomyopathy -EF in 2013 was 15-20%. Has ICD placed. -Currently chest pain free, and euvolemic -Given current GI bleed, will hold patient's Coreg, BiDil, Lasix, losartan, spironolactone -Monitor intake and output, daily weights, restart meds slowly after GI bleeding improves.  -Last echocardiogram 02/27/2016 showed an EF of 0000000, grade 2 diastolic dysfunction  Mild thrombocytopenia: counts around 127 to 119. Monitor.     DVT prophylaxis: scd's Code Status: DNR Family Communication: none at bedside.  Disposition Plan: PENDING further evaluation.    Consultants:   gi  Procedures:  Flex sig on 2/28  Antimicrobials: none.    Subjective: No complaints of abdominal pain or nausea or vomiting.  Continues to have rectal bleeding.  Requesting for regular diet.   Objective: Vitals:   02/21/17 1630 02/21/17 1635 02/21/17 1640 02/21/17 1645  BP: (!) 97/49 (!) 93/29 (!) 93/51 (!) 99/46  Pulse: 74 73 82 74  Resp: (!) 21 19 18 16   Temp:      TempSrc:      SpO2: 100% 100% 100% 100%  Weight:      Height:        Intake/Output Summary (Last 24 hours) at 02/21/17 1709 Last data filed at 02/21/17 0830  Gross per 24 hour  Intake              720 ml  Output             1025 ml  Net             -305 ml   [employer]   02/19/17  2057 02/20/17 2100  Weight: 89.1 kg (196 lb 6.4 oz) 96.8 kg (213 lb 6.5 oz)    Examination:  General exam: Appears calm and comfortable  Respiratory system: Clear to auscultation. Respiratory effort normal. Cardiovascular system: S1 & S2 heard,  No JVD, murmurs, rubs, gallops or clicks. No pedal edema. Gastrointestinal system: Abdomen is nondistended, soft and nontender. No organomegaly or masses felt. Normal bowel sounds heard. Central nervous system: Alert and oriented. No focal neurological deficits. Extremities: Symmetric  5 x 5 power.     Data Reviewed: I have personally reviewed following labs and imaging studies  CBC:  Recent Labs Lab 02/19/17 1457 02/19/17 2111 02/20/17 0415 02/20/17 1240 02/20/17 2131 02/21/17 1219  WBC 5.3  --  4.9  --   --   --   HGB 10.6* 9.8* 9.6* 9.6* 8.3* 7.8*  HCT 33.4* 30.2* 28.8* 30.4* 26.1* 23.4*  MCV 92.0  --  90.6  --   --   --   PLT 127*  --  119*  --   --   --    Basic Metabolic Panel:  Recent Labs Lab 02/19/17 1457 02/20/17 0415  NA 136 137  K 4.6 4.8  CL 108 109  CO2 23 20*  GLUCOSE 95 65  BUN 20 23*  CREATININE 1.08 0.98  CALCIUM 8.6* 8.5*   GFR: Estimated Creatinine Clearance: 85.6 mL/min (by C-G formula based on SCr of 0.98 mg/dL). Liver Function Tests:  Recent Labs Lab 02/19/17 1457  AST 18  ALT 18  ALKPHOS 75  BILITOT 0.5  PROT 6.6  ALBUMIN 3.3*   No results for input(s): LIPASE, AMYLASE in the last 168 hours. No results for input(s): AMMONIA in the last 168 hours. Coagulation Profile:  Recent Labs Lab 02/19/17 1350 02/19/17 2111  INR 1.1 1.29   Cardiac Enzymes: No results for input(s): CKTOTAL, CKMB, CKMBINDEX, TROPONINI in the last 168 hours. BNP (last 3 results) No results for input(s): PROBNP in the last 8760 hours. HbA1C: No results for input(s): HGBA1C in the last 72 hours. CBG:  Recent Labs Lab 02/20/17 1751 02/20/17 1809 02/20/17 1834 02/21/17 1644  GLUCAP 54* 43* 98 76   Lipid Profile: No results for input(s): CHOL, HDL, LDLCALC, TRIG, CHOLHDL, LDLDIRECT in the last 72 hours. Thyroid Function Tests: No results for input(s): TSH, T4TOTAL, FREET4, T3FREE, THYROIDAB in the last 72 hours. Anemia Panel: No results for input(s): VITAMINB12, FOLATE, FERRITIN, TIBC, IRON, RETICCTPCT in the last 72 hours. Sepsis Labs:  Recent Labs Lab 02/19/17 1517  LATICACIDVEN 0.83    No results found for this or any previous visit (from the past 240 hour(s)).       Radiology Studies: No results  found.      Scheduled Meds: . amiodarone  200 mg Oral Daily  . feeding supplement (ENSURE ENLIVE)  237 mL Oral BID BM  . phenytoin  100 mg Oral BID  . simvastatin  20 mg Oral q1800  . sodium chloride flush  3 mL Intravenous Q12H  . triamcinolone cream  1 application Topical BID   Continuous Infusions:    LOS: 0 days    Time spent: 30 min    Louis Malerba, MD [facility] Pager [phone]  If 7PM-7AM, please contact night-coverage [email or web address] Password [email or web address] 02/21/2017, 5:09 PM

## 2017-02-21 NOTE — Op Note (Signed)
[facility] Patient Name: Louis Ford Procedure Date : 02/21/2017 MRN: [id number] Attending MD: [name] , MD Date of Birth: [birth year] CSN: [id number] Age: 71 Admit Type: Inpatient Procedure:                Flexible Sigmoidoscopy Indications:              Hematochezia, further bleeding since flexible                            sigmoidoscopy yesterday for post-polypectomy                            hemorrhage, known post-polypectomy bleed in the                            sigmoid colon Providers:                Lajuan Lines. Hilarie Fredrickson, MD, Elna Breslow, RN, Corliss Parish, Technician Referring MD:             [facility] Medicines:                Fentanyl 25 micrograms IV, Midazolam 3 mg IV Complications:            No immediate complications. Estimated Blood Loss:     Estimated blood loss: none. Procedure:                Pre-Anesthesia Assessment:                           - Prior to the procedure, a History and Physical                            was performed, and patient medications and                            allergies were reviewed. The patient's tolerance of                            previous anesthesia was also reviewed. The risks                            and benefits of the procedure and the sedation                            options and risks were discussed with the patient.                            All questions were answered, and informed consent                            was obtained. Prior Anticoagulants: The patient has  taken Lovenox (enoxaparin), last dose was 2 days                            prior to procedure. ASA Grade Assessment: II - A                            patient with mild systemic disease. After reviewing                            the risks and benefits, the patient was deemed in                            satisfactory condition to undergo the procedure.             After obtaining informed consent, the scope was                            passed under direct vision. The Endoscope was                            introduced through the anus and advanced to the the                            sigmoid colon. The [id number] scope was                            introduced through the and advanced to the. The                            flexible sigmoidoscopy was accomplished without                            difficulty. The patient tolerated the procedure                            fairly well. The quality of the bowel preparation                            was adequate. Scope In: Scope Out: Findings:      The digital rectal exam was normal.      A 15 mm, polypectomy stalk with oozing and clotted blood was again found       in the sigmoid colon. Visualization today was better than yesterday.       Only 1 of the previously placed clips remained. For hemostasis, six       hemostatic clips were successfully placed (MR conditional). Given the       very broad base of the remaining stalk multiple clips were placed       initially at the base of the stalk on both sides and then slightly       closer to the tip. All 6 clips appeared to be in good position. There       was no bleeding at the end of the procedure.      Multiple small and large-mouthed diverticula were found in  the sigmoid       colon.      Retroflexion in the rectum was not performed. Impression:               - One 15 mm polypectomy stalk in the sigmoid colon.                            Clips x6 (MR conditional) were placed for                            hemostasis.                           - Diverticulosis in the sigmoid colon.                           - No specimens collected. Moderate Sedation:      Moderate (conscious) sedation was administered by the endoscopy nurse       and supervised by the endoscopist. The following parameters were       monitored: oxygen  saturation, heart rate, blood pressure, and response       to care. Total physician intraservice time was 40 minutes. Recommendation:           - Return patient to hospital ward for ongoing care.                           - Continue present medications.                           - Repeat Hgb tonight and transfuse if < 7.0                           - Hold anticoagulants for now                           - Okay for regular diet                           - If further bleeding will talk to IR and possibly                            surgery Procedure Code(s):        --- Professional ---                           [phone], Sigmoidoscopy, flexible; with control of                            bleeding, any method                           99153, Moderate sedation services; each additional                            15 minutes intraservice time  99153, Moderate sedation services; each additional                            15 minutes intraservice time                           G0500, Moderate sedation services provided by the                            same physician or other qualified health care                            professional performing a gastrointestinal                            endoscopic service that sedation supports,                            requiring the presence of an independent trained                            observer to assist in the monitoring of the                            patient's level of consciousness and physiological                            status; initial 15 minutes of intra-service time;                            patient age 2 years or older (additional time may                            be reported with [phone], as appropriate) Diagnosis Code(s):        --- Professional ---                           D12.5, Benign neoplasm of sigmoid colon                           K92.1, Melena (includes Hematochezia)                            K57.30, Diverticulosis of large intestine without                            perforation or abscess without bleeding CPT copyright 2016 American Medical Association. All rights reserved. The codes documented in this report are preliminary and upon coder review may  be revised to meet current compliance requirements. [name], MD 02/21/2017 4:49:58 PM This report has been signed electronically. Number of Addenda: 0

## 2017-02-21 NOTE — Progress Notes (Signed)
Results for EAGAN, BAERGA (MRN [id number]) as of 02/21/2017 21:26  Ref. Range 02/21/2017 20:30  Hemoglobin Latest Ref Range: 13.0 - 17.0 g/dL 7.0 (L)  NP/MD paged awaiting call back.

## 2017-02-22 ENCOUNTER — Encounter (HOSPITAL_COMMUNITY): Payer: Self-pay | Admitting: Internal Medicine

## 2017-02-22 LAB — BPAM RBC
BLOOD PRODUCT EXPIRATION DATE: 201803312359
ISSUE DATE / TIME: 201803012213
Unit Type and Rh: 5100

## 2017-02-22 LAB — TYPE AND SCREEN
ABO/RH(D): O POS
Antibody Screen: NEGATIVE
UNIT DIVISION: 0

## 2017-02-22 LAB — CBC
HCT: 23.3 % — ABNORMAL LOW (ref 39.0–52.0)
HEMOGLOBIN: 7.7 g/dL — AB (ref 13.0–17.0)
MCH: 30.3 pg (ref 26.0–34.0)
MCHC: 33 g/dL (ref 30.0–36.0)
MCV: 91.7 fL (ref 78.0–100.0)
PLATELETS: 123 10*3/uL — AB (ref 150–400)
RBC: 2.54 MIL/uL — AB (ref 4.22–5.81)
RDW: 13.9 % (ref 11.5–15.5)
WBC: 5.6 10*3/uL (ref 4.0–10.5)

## 2017-02-22 LAB — HEMOGLOBIN AND HEMATOCRIT, BLOOD
HCT: 23.2 % — ABNORMAL LOW (ref 39.0–52.0)
HEMOGLOBIN: 7.5 g/dL — AB (ref 13.0–17.0)

## 2017-02-22 NOTE — Progress Notes (Signed)
PROGRESS NOTE    [facility]  [id number] DOB: [birth year] DOA: 02/19/2017 PCP: [name], MD    Brief Narrative: Louis Ford. is a 71 y.o. male with a medical history of atrial fibrillation, hypertension, coronary artery disease, history of ischemic cardiomyopathy and systolic heart failure, seizure disorder, who presented to the emergency department with complaints of rectal bleeding. Patient had been having rectal bleeding profusely one month at which point he did see gastroenterologist. Patient is status post colonoscopy on 02/13/2017, with polypectomy. Since that time, he has been bleeding. He has not been on Lovenox or Coumadin since 02/14/2017. His hemoglobin dropped from baseline of 15 to 10.8 to 9.8. GI consulted and underwent  flex sigmoidoscopy on 2/28 and on 3/1.    Assessment & Plan:   Active Problems:   HTN (hypertension)   Coronary artery disease involving native heart   Ischemic cardiomyopathy  ? additional rate component   Seizure (HCC)   Chronic systolic heart failure (HCC)   Afib (HCC)   Hematochezia   GIB (gastrointestinal bleeding)   H/O colonoscopy with polypectomy   Acute GI bleeding, suspect lower GI bleeding: -S/p colonoscopy with polypectomy on 02/13/2017: Left-sided diverticulosis -Patient has been on lovenox (not since 02/14/2017) and coumadin (held since 02/07/2017) and currently INR 1.29 No signs of abdominal pain or discomfort, but continues to have rectal bleeding.  GI consulted and he underwent flex sigmoidoscopy on 2.28 and on 3/1.  It showed bleeding from the polypectomy stalk and it was clipped for hemostasis and diverticulosis seen.  Recommendations to monitor hemoglobin and to transfuse if hemoglobin is less than 7.   1 unit of prbc transfusion ordered, and repeat hemoglobin is 7.7 . Continue to monitor.   Atrial fibrillation:  Rate controlled. Subtherapeutic INR at 1.29. CHADVASC score of 5.  Anticoagulation held for now.     Hypertension: bp borderline on admission, holding bp meds for now.   Hyperlipidemia -Continue statin  Seizure disorder -Continue Dilantin  History of coronary artery disease/ischemic cardiomyopathy -EF in 2013 was 15-20%. Has ICD placed. -Currently chest pain free, and euvolemic -Given current GI bleed, will hold patient's Coreg, BiDil, Lasix, losartan, spironolactone -Monitor intake and output, daily weights, restart meds slowly after GI bleeding improves.  -Last echocardiogram 02/27/2016 showed an EF of 0000000, grade 2 diastolic dysfunction  Mild thrombocytopenia: counts around 127 to 119. Monitor.     DVT prophylaxis: scd's Code Status: DNR Family Communication: none at bedside.  Disposition Plan: PENDING further evaluation.    Consultants:   gi  Procedures:  Flex sig on 2/28  Antimicrobials: none.    Subjective: No rectal bleeding this am or last night.  No abdo pain .   Objective: Vitals:   02/21/17 2236 02/22/17 0050 02/22/17 0542 02/22/17 0945  BP: (!) 92/53 115/84 (!) 94/58 96/65  Pulse: 89 88 79 86  Resp: 20 20 18 18   Temp: 98.4 F (36.9 C) 98.5 F (36.9 C) 98.4 F (36.9 C) 98.6 F (37 C)  TempSrc: Oral Oral Oral Oral  SpO2: 100% 100% 100% 100%  Weight:      Height:        Intake/Output Summary (Last 24 hours) at 02/22/17 1337 Last data filed at 02/22/17 1300  Gross per 24 hour  Intake             1241 ml  Output              925 ml  Net  316 ml   Filed Weights   02/19/17 2057 02/20/17 2100 02/21/17 2025  Weight: 89.1 kg (196 lb 6.4 oz) 96.8 kg (213 lb 6.5 oz) 91.7 kg (202 lb 1.6 oz)    Examination:  General exam: Appears calm and comfortable  Respiratory system: Clear to auscultation. Respiratory effort normal. Cardiovascular system: S1 & S2 heard,  No JVD, murmurs, rubs, gallops or clicks. No pedal edema. Gastrointestinal system: Abdomen is nondistended, soft and nontender. No organomegaly or masses felt. Normal  bowel sounds heard. Central nervous system: Alert and oriented. No focal neurological deficits. Extremities: Symmetric 5 x 5 power.     Data Reviewed: I have personally reviewed following labs and imaging studies  CBC:  Recent Labs Lab 02/19/17 1457  02/20/17 0415 02/20/17 1240 02/20/17 2131 02/21/17 1219 02/21/17 2030 02/22/17 0433  WBC 5.3  --  4.9  --   --   --   --  5.6  HGB 10.6*  < > 9.6* 9.6* 8.3* 7.8* 7.0* 7.7*  HCT 33.4*  < > 28.8* 30.4* 26.1* 23.4* 21.3* 23.3*  MCV 92.0  --  90.6  --   --   --   --  91.7  PLT 127*  --  119*  --   --   --   --  123*  < > = values in this interval not displayed. Basic Metabolic Panel:  Recent Labs Lab 02/19/17 1457 02/20/17 0415  NA 136 137  K 4.6 4.8  CL 108 109  CO2 23 20*  GLUCOSE 95 65  BUN 20 23*  CREATININE 1.08 0.98  CALCIUM 8.6* 8.5*   GFR: Estimated Creatinine Clearance: 83.6 mL/min (by C-G formula based on SCr of 0.98 mg/dL). Liver Function Tests:  Recent Labs Lab 02/19/17 1457  AST 18  ALT 18  ALKPHOS 75  BILITOT 0.5  PROT 6.6  ALBUMIN 3.3*   No results for input(s): LIPASE, AMYLASE in the last 168 hours. No results for input(s): AMMONIA in the last 168 hours. Coagulation Profile:  Recent Labs Lab 02/19/17 1350 02/19/17 2111  INR 1.1 1.29   Cardiac Enzymes: No results for input(s): CKTOTAL, CKMB, CKMBINDEX, TROPONINI in the last 168 hours. BNP (last 3 results) No results for input(s): PROBNP in the last 8760 hours. HbA1C: No results for input(s): HGBA1C in the last 72 hours. CBG:  Recent Labs Lab 02/20/17 1809 02/20/17 1834 02/21/17 1334 02/21/17 1644 02/21/17 1722  GLUCAP 43* 98 104* 76 93   Lipid Profile: No results for input(s): CHOL, HDL, LDLCALC, TRIG, CHOLHDL, LDLDIRECT in the last 72 hours. Thyroid Function Tests: No results for input(s): TSH, T4TOTAL, FREET4, T3FREE, THYROIDAB in the last 72 hours. Anemia Panel: No results for input(s): VITAMINB12, FOLATE, FERRITIN,  TIBC, IRON, RETICCTPCT in the last 72 hours. Sepsis Labs:  Recent Labs Lab 02/19/17 1517  LATICACIDVEN 0.83    No results found for this or any previous visit (from the past 240 hour(s)).       Radiology Studies: No results found.      Scheduled Meds: . amiodarone  200 mg Oral Daily  . feeding supplement (ENSURE ENLIVE)  237 mL Oral BID BM  . phenytoin  100 mg Oral BID  . simvastatin  20 mg Oral q1800  . sodium chloride flush  3 mL Intravenous Q12H  . triamcinolone cream  1 application Topical BID   Continuous Infusions:    LOS: 1 day    Time spent: 30 min    Lyann Hagstrom, MD [facility] Pager [phone]  If 7PM-7AM, please contact night-coverage [email or web address] Password [email or web address] 02/22/2017, 1:37 PM

## 2017-02-22 NOTE — Consult Note (Signed)
   [facility] CM Inpatient Consult   02/22/2017  [facility]. [birth year] [id number]  Referral received for follow up.  Patient has a HX with [facility] under his [place].     Chart review reveals the patient is [facility]is a 71 y.o.malewith a medical history of atrial fibrillation, hypertension, coronary artery disease, history of ischemic cardiomyopathy and systolic heart failure, seizure disorder, who presented to the emergency department with complaints of rectal bleeding. Patient had been having rectal bleeding profusely one month at which point he did see gastroenterologist.  Met with the patient at the bedside.  Patient acknowledges that he was familiar with [facility] but he leaves all his decisions to his wife. Patient alert, oriented and answers questions appropriately.   Asked if his wife could be reached at this time and he states, "No, she is out taking care of something right now but you can leave your information on the table."  Brochure and contact information left at the bedside with 24 hour nurse advise line magnet. Encouraged to call.  Follow up with inpatient RNCM regarding patient being eligible and for needs.  For questions, please contact:  Natividad Brood, RN BSN [facility] Liaison  [phone] business mobile phone Toll free office [phone]

## 2017-02-22 NOTE — Progress Notes (Signed)
Daily Rounding Note  02/22/2017, 9:47 AM  LOS: 1 day   SUBJECTIVE:   Chief complaint: hematochezia.  No bleeding, no stools since flex sig yesterday afternoon.  Fells well.  Worried about stroke while off Coumadin.        OBJECTIVE:         Vital signs in last 24 hours:    Temp:  [98.3 F (36.8 C)-99.3 F (37.4 C)] 98.4 F (36.9 C) (03/02 0542) Pulse Rate:  [68-89] 79 (03/02 0542) Resp:  [14-27] 18 (03/02 0542) BP: (71-121)/(29-84) 94/58 (03/02 0542) SpO2:  [98 %-100 %] 100 % (03/02 0542) Weight:  [91.7 kg (202 lb 1.6 oz)] 91.7 kg (202 lb 1.6 oz) (03/01 2025) Last BM Date: 02/21/17 Filed Weights   02/19/17 2057 02/20/17 2100 02/21/17 2025  Weight: 89.1 kg (196 lb 6.4 oz) 96.8 kg (213 lb 6.5 oz) 91.7 kg (202 lb 1.6 oz)   General: pleasant, alert, comfortable.  Not acutely ill looking   Heart: RRR Chest: clear bil.  No cough or dyspnea Abdomen: soft, NT, ND.  Active BS.    Extremities:  No CCE Neuro/Psych:  Oriented x 3.  Slight expressive aphasia.    Intake/Output from previous day: 03/01 0701 - 03/02 0700 In: 1021 [P.O.:720; I.V.:3; Blood:298] Out: 725 [Urine:725]  Intake/Output this shift: Total I/O In: -  Out: 200 [Urine:200]  Lab Results:  Recent Labs  02/19/17 1457  02/20/17 0415  02/21/17 1219 02/21/17 2030 02/22/17 0433  WBC 5.3  --  4.9  --   --   --  5.6  HGB 10.6*  < > 9.6*  < > 7.8* 7.0* 7.7*  HCT 33.4*  < > 28.8*  < > 23.4* 21.3* 23.3*  PLT 127*  --  119*  --   --   --  123*  < > = values in this interval not displayed. BMET  Recent Labs  02/19/17 1457 02/20/17 0415  NA 136 137  K 4.6 4.8  CL 108 109  CO2 23 20*  GLUCOSE 95 65  BUN 20 23*  CREATININE 1.08 0.98  CALCIUM 8.6* 8.5*   LFT  Recent Labs  02/19/17 1457  PROT 6.6  ALBUMIN 3.3*  AST 18  ALT 18  ALKPHOS 75  BILITOT 0.5   PT/INR  Recent Labs  02/19/17 1350 02/19/17 2111  LABPROT  --  16.2*  INR 1.1  1.29   Hepatitis Panel No results for input(s): HEPBSAG, HCVAB, HEPAIGM, HEPBIGM in the last 72 hours.  Studies/Results: No results found.   Scheduled Meds: . amiodarone  200 mg Oral Daily  . feeding supplement (ENSURE ENLIVE)  237 mL Oral BID BM  . phenytoin  100 mg Oral BID  . simvastatin  20 mg Oral q1800  . sodium chloride flush  3 mL Intravenous Q12H  . triamcinolone cream  1 application Topical BID   Continuous Infusions: PRN Meds:.sodium chloride, acetaminophen, dextrose, dextrose, HYDROcodone-acetaminophen, sodium chloride flush, traMADol   ASSESMENT:   *  Post polypectomy GI bleed.  Flex sig 2/28: hemostasis obtained post 4 clips applied to actively bleeding polyp stalk. fles sig 3/1: additional 6 clips placed to polyp stalk which was oozing, covered with clot.  Hemostasis observed after clipping.      *  ABL anemia.  Fair response to 1 PRBC on 3/1.   Hgb dropped 3.5 gm since admission.  Hgb down 8 grams from baseline 6 weeks ago.    *  Chronic Coumadin for Afib. Resumed 2/22 for one dose, then discontinued, post 2/21 colonoscopy. INR subtherapeutic.    *  Thrombocytopenia.  Non-critical.  Intermittent hx of same dates to 2013.      PLAN   *  Observe.  Serial Hgb/Hct.    *  No restart Coumadin for several days.     Azucena Freed  02/22/2017, 9:47 AM Pager: [phone]

## 2017-02-23 ENCOUNTER — Inpatient Hospital Stay (HOSPITAL_COMMUNITY): Payer: Medicare Other

## 2017-02-23 DIAGNOSIS — M79609 Pain in unspecified limb: Secondary | ICD-10-CM

## 2017-02-23 DIAGNOSIS — I2510 Atherosclerotic heart disease of native coronary artery without angina pectoris: Secondary | ICD-10-CM

## 2017-02-23 LAB — CBC
HEMATOCRIT: 23.9 % — AB (ref 39.0–52.0)
Hemoglobin: 7.8 g/dL — ABNORMAL LOW (ref 13.0–17.0)
MCH: 30.2 pg (ref 26.0–34.0)
MCHC: 32.6 g/dL (ref 30.0–36.0)
MCV: 92.6 fL (ref 78.0–100.0)
Platelets: 146 10*3/uL — ABNORMAL LOW (ref 150–400)
RBC: 2.58 MIL/uL — ABNORMAL LOW (ref 4.22–5.81)
RDW: 14.2 % (ref 11.5–15.5)
WBC: 5.9 10*3/uL (ref 4.0–10.5)

## 2017-02-23 LAB — HEPARIN LEVEL (UNFRACTIONATED): Heparin Unfractionated: 0.38 IU/mL (ref 0.30–0.70)

## 2017-02-23 MED ORDER — HEPARIN (PORCINE) IN NACL 100-0.45 UNIT/ML-% IJ SOLN
950.0000 [IU]/h | INTRAMUSCULAR | Status: DC
  Administered 2017-02-23 – 2017-02-24 (×2): 1450 [IU]/h via INTRAVENOUS
  Administered 2017-02-25: 950 [IU]/h via INTRAVENOUS
  Filled 2017-02-23 (×3): qty 250

## 2017-02-23 MED ORDER — FUROSEMIDE 20 MG PO TABS
20.0000 mg | ORAL_TABLET | Freq: Every day | ORAL | Status: DC
Start: 2017-02-23 — End: 2017-02-25
  Administered 2017-02-23 – 2017-02-25 (×3): 20 mg via ORAL
  Filled 2017-02-23 (×3): qty 1

## 2017-02-23 NOTE — Progress Notes (Signed)
CROSS COVER [facility]GI Subjective: Louis Ford.is a 71 year old blackmalewith a multiple medical problems including seizure disorder, HTN, CAD, Atrial fibrillation, ischemic cardiomyopathy and systolic heart failure who presented to the [facility] ED with rectal bleeding. He had been having rectal bleeding profusely one month at which point had a colonoscopy on 02/13/2017 when a large sigmoid polyp was removed. Since he had the polypectomy he has been bleeding. He has not been on Lovenox or Coumadin since 02/14/2017. His hemoglobin dropped from baseline of 15 to 10.8 to 9.8. GI consulted and underwent  flex sigmoidoscopy on 2/28 and on 02/21/17 with hemoclip placement both times, when the bleeding was controlled. He was started on heparin after the second x-ray sigmoidoscopy to see if he would have any problems with rebleeding at this point. Patient denies having any melena or hematochezia. In fact he has not had a bowel movement since his second proximal sigmoidoscopy was done. He does admit to having mild nausea without vomiting through the day today. He denies having any abdominal pain.  Objective: Vital signs in last 24 hours: Temp:  [98.1 F (36.7 C)-98.6 F (37 C)] 98.1 F (36.7 C) (03/03 0511) Pulse Rate:  [67-86] 67 (03/03 0511) Resp:  [18-23] 18 (03/03 0511) BP: (96-112)/(59-68) 112/62 (03/03 0511) SpO2:  [100 %] 100 % (03/03 0511) Weight:  [92.7 kg (204 lb 4.8 oz)] 92.7 kg (204 lb 4.8 oz) (03/02 2203) Last BM Date: 02/21/17  Intake/Output from previous day: 03/02 0701 - 03/03 0700 In: 1003 [P.O.:1000; I.V.:3] Out: 1000 [Urine:1000] Intake/Output this shift: No intake/output data recorded.  General appearance: alert, cooperative, appears stated age and no distress Resp: clear to auscultation bilaterally Cardio: regular rate and rhythm, S1, S2 normal, no murmur, click, rub or gallop GI: soft, non-tender; bowel sounds normal; no masses,  no organomegaly Extremities: extremities normal,  atraumatic, no cyanosis or edema  Lab Results:  Recent Labs  02/22/17 0433 02/22/17 1441 02/23/17 0544  WBC 5.6  --  5.9  HGB 7.7* 7.5* 7.8*  HCT 23.3* 23.2* 23.9*  PLT 123*  --  146*   Medications: I have reviewed the patient's current medications.  Assessment/Plan: Posthemorrhagic anemia after colonoscopy with polypectomy of a large sigmoid polyp-currently stable. Hemoclips had been placed at after flexible sigmoidoscopy been done on 2 occasions after his recent admission. While vital seems to be stable patient seems to be doing well on the heparin drip will continue to monitor his response and make further plans with the regards to his anticoagulation that he will require considering his comorbidities.   LOS: 2 days   Aunesty Tyson 02/23/2017, 8:47 AM

## 2017-02-23 NOTE — Progress Notes (Signed)
ANTICOAGULATION CONSULT NOTE - Initial Consult  Pharmacy Consult for Heparin Indication: atrial fibrillation  No Known Allergies  Patient Measurements: Height: 5\' 11"  (180.3 cm) Weight: 204 lb 4.8 oz (92.7 kg) IBW/kg (Calculated) : 75.3 Heparin Dosing Weight: 92.7 kg  Vital Signs: Temp: 98.1 F (36.7 C) (03/03 0511) Temp Source: Oral (03/03 0511) BP: 112/62 (03/03 0511) Pulse Rate: 67 (03/03 0511)  Labs:  Recent Labs  02/22/17 0433 02/22/17 1441 02/23/17 0544  HGB 7.7* 7.5* 7.8*  HCT 23.3* 23.2* 23.9*  PLT 123*  --  146*    Estimated Creatinine Clearance: 84 mL/min (by C-G formula based on SCr of 0.98 mg/dL).   Medical History: Past Medical History:  Diagnosis Date  . AICD (automatic cardioverter/defibrillator) present   . Atrial fibrillation   09/22/2012  . CAD (coronary artery disease)   . Chronic systolic heart failure ([place])   . 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 GIB (gastrointestinal bleeding) 02/20/2017  . History of MRSA infection 05/2011  . HTN (hypertension)   . Hx of adenomatous colonic polyps 02/20/2017   01/2017 - 3 cm sigmoid TV adenoma and other smaller polyps - had post-polypectomy bleed Tx w/ clips Consider repeat colonoscopy 3 yrs [name], MD, [name]   . Hyperlipidemia   . implantable cardiac defibrillator-Biotronik    Device Implanted 2006; s/p gen change 03/2011 : bleeding persistent with pocket erosion and infection; explant and reimplant  06/2011  . Ischemic cardiomyopathy    EF 15 to 20% by TTE and TEE in 09/2012.  Severe LV dysfunction  . Obesity    BMI 31 in 09/2012  . Seizure disorder ([place]) latest 09/30/2012  . Seizures ([place])   . Stroke [facility])     Assessment: 50 yoM with on coumadin PTA for Afib, now with GI bleed s/p flex sig on 3/1 where 6 clips were placed. Pharmacy consulted to start heparin and monitor patient for 24 hours. If well-tolerated then plans to transition to warfarin and  discharge home.   Hgb 7.8 (improved after PRBC two days ago); platelets low, but increasing.   No bolus due to recent GI bleed and procedure. No bleeding noted per nurse.  Goal of Therapy:  Heparin level 0.3-0.7 units/ml Monitor platelets by anticoagulation protocol: Yes   Plan:  - Start heparin 1450 units/hr - 6hr HL - Daily CBC/HL - Monitor bleeding  Dierdre Harness, Cain Sieve, PharmD Clinical Pharmacy Resident [phone] (Pager) 02/23/2017 9:43 AM

## 2017-02-23 NOTE — Progress Notes (Signed)
*  PRELIMINARY RESULTS* Vascular Ultrasound Right upper extremity venous duplex has been completed.  Preliminary findings: No evidence of DVT or superficial thrombosis.   Landry Mellow, RDMS, RVT  02/23/2017, 2:45 PM

## 2017-02-23 NOTE — Progress Notes (Signed)
ANTICOAGULATION CONSULT NOTE  Pharmacy Consult for Heparin Indication: atrial fibrillation  No Known Allergies  Patient Measurements: Height: 5\' 11"  (180.3 cm) Weight: 204 lb 4.8 oz (92.7 kg) IBW/kg (Calculated) : 75.3 Heparin Dosing Weight: 92.7 kg  Vital Signs: Temp: 98 F (36.7 C) (03/03 0930) Temp Source: Oral (03/03 0930) BP: 127/81 (03/03 0930) Pulse Rate: 84 (03/03 0930)  Labs:  Recent Labs  02/22/17 0433 02/22/17 1441 02/23/17 0544 02/23/17 1539  HGB 7.7* 7.5* 7.8*  --   HCT 23.3* 23.2* 23.9*  --   PLT 123*  --  146*  --   HEPARINUNFRC  --   --   --  0.38    Estimated Creatinine Clearance: 84 mL/min (by C-G formula based on SCr of 0.98 mg/dL).   Assessment: 49 yoM with on coumadin PTA for Afib, now with GI bleed s/p flex sig on 3/1 where 6 clips were placed. Pharmacy consulted to start heparin and monitor patient for 24 hours. If well-tolerated then plans to transition to warfarin and discharge home.   Hgb 7.8 (improved after PRBC two days ago); platelets low, but increasing.  Initial heparin level therapeutic at 0.38 units/mL. No bleeding noted.  Goal of Therapy:  Heparin level 0.3-0.7 units/ml Monitor platelets by anticoagulation protocol: Yes   Plan:  - Continue heparin at 1450 units/hr - Daily CBC and heparin level - Monitor bleeding  Kanye Depree D. Marielis Samara, PharmD, BCPS Clinical Pharmacist Pager: [phone] 02/23/2017 4:46 PM

## 2017-02-23 NOTE — Progress Notes (Signed)
PROGRESS NOTE    [facility].  [id number] DOB: 11-07-1946 DOA: 02/19/2017 PCP: [name], MD    Brief Narrative: Louis Dandy. is a 71 y.o. male with a medical history of atrial fibrillation, hypertension, coronary artery disease, history of ischemic cardiomyopathy and systolic heart failure, seizure disorder, who presented to the emergency department with complaints of rectal bleeding. Patient had been having rectal bleeding profusely one month at which point he did see gastroenterologist. Patient is status post colonoscopy on 02/13/2017, with polypectomy. Since that time, he has been bleeding. He has not been on Lovenox or Coumadin since 02/14/2017. His hemoglobin dropped from baseline of 15 to 10.8 to 9.8. GI consulted and underwent  flex sigmoidoscopy on 2/28 and on 3/1.    Assessment & Plan:   Active Problems:   HTN (hypertension)   Coronary artery disease involving native heart   Ischemic cardiomyopathy  ? additional rate component   Seizure (HCC)   Chronic systolic heart failure (HCC)   Afib (HCC)   Hematochezia   GIB (gastrointestinal bleeding)   H/O colonoscopy with polypectomy   Acute GI bleeding, suspect lower GI bleeding: -S/p colonoscopy with polypectomy on 02/13/2017: Left-sided diverticulosis -Patient has been on lovenox (not since 02/14/2017) and coumadin (held since 02/07/2017)  No signs of abdominal pain or discomfort, but continued to have rectal bleeding.  GI consulted and he underwent flex sigmoidoscopy on 2.28 and on 3/1.  It showed bleeding from the polypectomy stalk and it was clipped for hemostasis and diverticulosis seen.  Recommendations to monitor hemoglobin and to transfuse if hemoglobin is less than 7.   1 unit of prbc transfusion ordered, and repeat hemoglobin is 7.7 . Continue to monitor.  Restarting heparin and monitor for bleeding.  Recheck hemoglobin tonight.   Atrial fibrillation:  Rate controlled. Subtherapeutic INR. CHADVASC  score of 5.  Anticoagulation held for now in view of rectal bleeding. Resume amiodarone.     Hypertension: bp borderline on admission, holding bp meds for now.   Hyperlipidemia -Continue statin  Seizure disorder -Continue Dilantin  History of coronary artery disease/ischemic cardiomyopathy -EF in 2013 was 15-20%. Has ICD placed. -Currently chest pain free, and euvolemic -Given current GI bleed, his meds Coreg, BiDil, Lasix, losartan, spironolactone were held. Restarted lasix today.  -Monitor intake and output, daily weights, restart meds slowly after GI bleeding improves.  -Last echocardiogram 02/27/2016 showed an EF of 0000000, grade 2 diastolic dysfunction  Mild thrombocytopenia: counts around 146 to 119. Monitor.   Right arm swelling: some tenderness and tingling of the arm.  Venous duplex of the right arm.     DVT prophylaxis: scd's Code Status: DNR Family Communication: none at bedside.  Disposition Plan: PENDING further evaluation.    Consultants:   gi  Procedures:  Flex sig on 2/28  Antimicrobials: none.    Subjective: No rectal bleeding this am or last night.  Right arm swelling.   Objective: Vitals:   02/22/17 1535 02/22/17 2203 02/23/17 0511 02/23/17 0930  BP: (!) 101/59 100/68 112/62 127/81  Pulse: 80 76 67 84  Resp: (!) 23 20 18 18   Temp: 98.5 F (36.9 C) 98.3 F (36.8 C) 98.1 F (36.7 C) 98 F (36.7 C)  TempSrc: Oral Oral Oral Oral  SpO2: 100% 100% 100% 100%  Weight:  92.7 kg (204 lb 4.8 oz)    Height:  5\' 11"  (1.803 m)      Intake/Output Summary (Last 24 hours) at 02/23/17 1354 Last data filed at  02/23/17 1346  Gross per 24 hour  Intake              780 ml  Output             1300 ml  Net             -520 ml   Filed Weights   02/20/17 2100 02/21/17 2025 02/22/17 2203  Weight: 96.8 kg (213 lb 6.5 oz) 91.7 kg (202 lb 1.6 oz) 92.7 kg (204 lb 4.8 oz)    Examination:  General exam: Appears calm and comfortable  Respiratory  system: Clear to auscultation. Respiratory effort normal. Cardiovascular system: S1 & S2 heard,  No JVD, murmurs, rubs, gallops or clicks. No pedal edema. Gastrointestinal system: Abdomen is nondistended, soft and nontender. No organomegaly or masses felt. Normal bowel sounds heard. Central nervous system: Alert and oriented. No focal neurological deficits. Extremities: Symmetric 5 x 5 power.     Data Reviewed: I have personally reviewed following labs and imaging studies  CBC:  Recent Labs Lab 02/19/17 1457  02/20/17 0415  02/21/17 1219 02/21/17 2030 02/22/17 0433 02/22/17 1441 02/23/17 0544  WBC 5.3  --  4.9  --   --   --  5.6  --  5.9  HGB 10.6*  < > 9.6*  < > 7.8* 7.0* 7.7* 7.5* 7.8*  HCT 33.4*  < > 28.8*  < > 23.4* 21.3* 23.3* 23.2* 23.9*  MCV 92.0  --  90.6  --   --   --  91.7  --  92.6  PLT 127*  --  119*  --   --   --  123*  --  146*  < > = values in this interval not displayed. Basic Metabolic Panel:  Recent Labs Lab 02/19/17 1457 02/20/17 0415  NA 136 137  K 4.6 4.8  CL 108 109  CO2 23 20*  GLUCOSE 95 65  BUN 20 23*  CREATININE 1.08 0.98  CALCIUM 8.6* 8.5*   GFR: Estimated Creatinine Clearance: 84 mL/min (by C-G formula based on SCr of 0.98 mg/dL). Liver Function Tests:  Recent Labs Lab 02/19/17 1457  AST 18  ALT 18  ALKPHOS 75  BILITOT 0.5  PROT 6.6  ALBUMIN 3.3*   No results for input(s): LIPASE, AMYLASE in the last 168 hours. No results for input(s): AMMONIA in the last 168 hours. Coagulation Profile:  Recent Labs Lab 02/19/17 1350 02/19/17 2111  INR 1.1 1.29   Cardiac Enzymes: No results for input(s): CKTOTAL, CKMB, CKMBINDEX, TROPONINI in the last 168 hours. BNP (last 3 results) No results for input(s): PROBNP in the last 8760 hours. HbA1C: No results for input(s): HGBA1C in the last 72 hours. CBG:  Recent Labs Lab 02/20/17 1809 02/20/17 1834 02/21/17 1334 02/21/17 1644 02/21/17 1722  GLUCAP 43* 98 104* 76 93   Lipid  Profile: No results for input(s): CHOL, HDL, LDLCALC, TRIG, CHOLHDL, LDLDIRECT in the last 72 hours. Thyroid Function Tests: No results for input(s): TSH, T4TOTAL, FREET4, T3FREE, THYROIDAB in the last 72 hours. Anemia Panel: No results for input(s): VITAMINB12, FOLATE, FERRITIN, TIBC, IRON, RETICCTPCT in the last 72 hours. Sepsis Labs:  Recent Labs Lab 02/19/17 1517  LATICACIDVEN 0.83    No results found for this or any previous visit (from the past 240 hour(s)).       Radiology Studies: No results found.      Scheduled Meds: . amiodarone  200 mg Oral Daily  . feeding supplement (ENSURE ENLIVE)  237 mL Oral BID BM  . furosemide  20 mg Oral Daily  . phenytoin  100 mg Oral BID  . simvastatin  20 mg Oral q1800  . sodium chloride flush  3 mL Intravenous Q12H  . triamcinolone cream  1 application Topical BID   Continuous Infusions: . heparin 1,450 Units/hr (02/23/17 1043)     LOS: 2 days    Time spent: 30 min    Louis Melendrez, MD [facility] Pager [phone]  If 7PM-7AM, please contact night-coverage [email or web address] Password [email or web address] 02/23/2017, 1:54 PM

## 2017-02-24 LAB — BASIC METABOLIC PANEL
ANION GAP: 6 (ref 5–15)
BUN: 9 mg/dL (ref 6–20)
CALCIUM: 8.1 mg/dL — AB (ref 8.9–10.3)
CO2: 26 mmol/L (ref 22–32)
Chloride: 106 mmol/L (ref 101–111)
Creatinine, Ser: 1.04 mg/dL (ref 0.61–1.24)
Glucose, Bld: 92 mg/dL (ref 65–99)
POTASSIUM: 4.1 mmol/L (ref 3.5–5.1)
SODIUM: 138 mmol/L (ref 135–145)

## 2017-02-24 LAB — HEPARIN LEVEL (UNFRACTIONATED)
HEPARIN UNFRACTIONATED: 0.93 [IU]/mL — AB (ref 0.30–0.70)
HEPARIN UNFRACTIONATED: 0.88 [IU]/mL — AB (ref 0.30–0.70)
HEPARIN UNFRACTIONATED: 0.61 [IU]/mL (ref 0.30–0.70)

## 2017-02-24 LAB — CBC
HCT: 25 % — ABNORMAL LOW (ref 39.0–52.0)
Hemoglobin: 8.2 g/dL — ABNORMAL LOW (ref 13.0–17.0)
MCH: 30.4 pg (ref 26.0–34.0)
MCHC: 32.8 g/dL (ref 30.0–36.0)
MCV: 92.6 fL (ref 78.0–100.0)
PLATELETS: 169 10*3/uL (ref 150–400)
RBC: 2.7 MIL/uL — AB (ref 4.22–5.81)
RDW: 14 % (ref 11.5–15.5)
WBC: 5.6 10*3/uL (ref 4.0–10.5)

## 2017-02-24 MED ORDER — SENNOSIDES-DOCUSATE SODIUM 8.6-50 MG PO TABS
2.0000 | ORAL_TABLET | Freq: Two times a day (BID) | ORAL | Status: DC
  Administered 2017-02-24 – 2017-02-25 (×3): 2 via ORAL
  Filled 2017-02-24 (×3): qty 2

## 2017-02-24 MED ORDER — POLYETHYLENE GLYCOL 3350 17 G PO PACK
17.0000 g | PACK | Freq: Every day | ORAL | Status: DC
  Administered 2017-02-24 – 2017-02-25 (×2): 17 g via ORAL
  Filled 2017-02-24 (×2): qty 1

## 2017-02-24 NOTE — Progress Notes (Signed)
ANTICOAGULATION CONSULT NOTE - Initial Consult  Pharmacy Consult for Heparin Indication: atrial fibrillation  No Known Allergies  Patient Measurements: Height: 5\' 11"  (180.3 cm) Weight: 203 lb 12.8 oz (92.4 kg) IBW/kg (Calculated) : 75.3 Heparin Dosing Weight: 92.7 kg  Vital Signs: Temp: 98.5 F (36.9 C) (03/04 0842) Temp Source: Oral (03/04 0842) BP: 113/70 (03/04 0842) Pulse Rate: 86 (03/04 0842)  Labs:  Recent Labs  02/22/17 0433 02/22/17 1441 02/23/17 0544 02/23/17 1539 02/24/17 0527 02/24/17 1436  HGB 7.7* 7.5* 7.8*  --  8.2*  --   HCT 23.3* 23.2* 23.9*  --  25.0*  --   PLT 123*  --  146*  --  169  --   HEPARINUNFRC  --   --   --  0.38 0.93* 0.88*  CREATININE  --   --   --   --  1.04  --     Estimated Creatinine Clearance: 78.9 mL/min (by C-G formula based on SCr of 1.04 mg/dL).   Medical History: Past Medical History:  Diagnosis Date  . AICD (automatic cardioverter/defibrillator) present   . Atrial fibrillation   09/22/2012  . CAD (coronary artery disease)   . Chronic systolic heart failure ([name])   . 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 GIB (gastrointestinal bleeding) 02/20/2017  . History of MRSA infection 05/2011  . HTN (hypertension)   . Hx of adenomatous colonic polyps 02/20/2017   01/2017 - 3 cm sigmoid TV adenoma and other smaller polyps - had post-polypectomy bleed Tx w/ clips Consider repeat colonoscopy 3 yrs [name], MD, [name]   . Hyperlipidemia   . implantable cardiac defibrillator-Biotronik    Device Implanted 2006; s/p gen change 03/2011 : bleeding persistent with pocket erosion and infection; explant and reimplant  06/2011  . Ischemic cardiomyopathy    EF 15 to 20% by TTE and TEE in 09/2012.  Severe LV dysfunction  . Obesity    BMI 31 in 09/2012  . Seizure disorder ([place]) latest 09/30/2012  . Seizures ([place])   . Stroke [facility])     Assessment: 11 yoM with on coumadin PTA for Afib, now with  GI bleed s/p flex sig on 3/1 where 6 clips were placed. Pharmacy consulted to start heparin and monitor patient for 24 hours. If well-tolerated then plans to transition to warfarin and discharge home.   Heparin level still supratherapeutic after dose reduction. Hgb 8.2 (improved after PRBC three days ago); platelets wnl today.   Goal of Therapy:  Heparin level 0.3-0.7 units/ml Monitor platelets by anticoagulation protocol: Yes   Plan:  - Decrease heparin to 950 units/hr - 6hr HL - Daily CBC/HL - Monitor bleeding  Dierdre Harness, Cain Sieve, PharmD Clinical Pharmacy Resident [phone] (Pager) 02/24/2017 3:34 PM

## 2017-02-24 NOTE — Progress Notes (Signed)
Cross cover [facility] Subjective: Since I last evaluated the patient, he's had no further bleeding. He in fact is that had a bowel movement in the last 2-3 days. He is tolerating his diet well. He denies having any nausea vomiting or abdominal pain. There has been no further bleeding since her last flexible sigmoidoscopy done by Dr. Zenovia Jarred.  Objective: Vital signs in last 24 hours: Temp:  [98.1 F (36.7 C)-99 F (37.2 C)] 98.5 F (36.9 C) (03/04 0842) Pulse Rate:  [72-86] 86 (03/04 0842) Resp:  [18] 18 (03/04 0842) BP: (109-113)/(59-70) 113/70 (03/04 0842) SpO2:  [100 %] 100 % (03/04 0842) Weight:  [92.4 kg (203 lb 12.8 oz)] 92.4 kg (203 lb 12.8 oz) (03/03 2144) Last BM Date: 02/21/17  Intake/Output from previous day: 03/03 0701 - 03/04 0700 In: 1059.6 [P.O.:780; I.V.:279.6] Out: 2000 [Urine:2000] Intake/Output this shift: Total I/O In: 240 [P.O.:240] Out: 225 [Urine:225]  General appearance: alert, cooperative, appears stated age and no distress Resp: clear to auscultation bilaterally Cardio: regular rate and rhythm, S1, S2 normal, no murmur, click, rub or gallop GI: soft, non-tender; bowel sounds normal; no masses,  no organomegaly Extremities: extremities normal, atraumatic, no cyanosis or edema  Lab Results:  Recent Labs  02/22/17 0433 02/22/17 1441 02/23/17 0544 02/24/17 0527  WBC 5.6  --  5.9 5.6  HGB 7.7* 7.5* 7.8* 8.2*  HCT 23.3* 23.2* 23.9* 25.0*  PLT 123*  --  146* 169   BMET  Recent Labs  02/24/17 0527  NA 138  K 4.1  CL 106  CO2 26  GLUCOSE 92  BUN 9  CREATININE 1.04  CALCIUM 8.1*   Medications: I have reviewed the patient's current medications.  Assessment/Plan: 1) Acute blood loss anemia secondary to a post polypectomy bleed; currently stable after multiple hemoclips applied to the polypectomy site. We will sign off now please call as further assistance is needed.  2) Sigmoid diverticulosis.  3) History of coronary artery disease with  ischemic cardiomyopathy and atrial fibrillation.  LOS: 3 days   Conall Vangorder 02/24/2017, 9:41 AM

## 2017-02-24 NOTE — Progress Notes (Signed)
ANTICOAGULATION CONSULT NOTE - Initial Consult  Pharmacy Consult for Heparin Indication: atrial fibrillation  No Known Allergies  Patient Measurements: Height: 5\' 11"  (180.3 cm) Weight: 203 lb 12.8 oz (92.4 kg) IBW/kg (Calculated) : 75.3 Heparin Dosing Weight: 92.7 kg  Vital Signs: Temp: 98.2 F (36.8 C) (03/04 1819) Temp Source: Oral (03/04 1819) BP: 104/64 (03/04 1819) Pulse Rate: 78 (03/04 1819)  Labs:  Recent Labs  02/22/17 0433 02/22/17 1441 02/23/17 0544  02/24/17 0527 02/24/17 1436 02/24/17 2031  HGB 7.7* 7.5* 7.8*  --  8.2*  --   --   HCT 23.3* 23.2* 23.9*  --  25.0*  --   --   PLT 123*  --  146*  --  169  --   --   HEPARINUNFRC  --   --   --   < > 0.93* 0.88* 0.61  CREATININE  --   --   --   --  1.04  --   --   < > = values in this interval not displayed.  Estimated Creatinine Clearance: 78.9 mL/min (by C-G formula based on SCr of 1.04 mg/dL).   Medical History: Past Medical History:  Diagnosis Date  . AICD (automatic cardioverter/defibrillator) present   . Atrial fibrillation   09/22/2012  . CAD (coronary artery disease)   . Chronic systolic heart failure ([place])   . 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 GIB (gastrointestinal bleeding) 02/20/2017  . History of MRSA infection 05/2011  . HTN (hypertension)   . Hx of adenomatous colonic polyps 02/20/2017   01/2017 - 3 cm sigmoid TV adenoma and other smaller polyps - had post-polypectomy bleed Tx w/ clips Consider repeat colonoscopy 3 yrs [name], MD, [name]   . Hyperlipidemia   . implantable cardiac defibrillator-Biotronik    Device Implanted 2006; s/p gen change 03/2011 : bleeding persistent with pocket erosion and infection; explant and reimplant  06/2011  . Ischemic cardiomyopathy    EF 15 to 20% by TTE and TEE in 09/2012.  Severe LV dysfunction  . Obesity    BMI 31 in 09/2012  . Seizure disorder ([place]) latest 09/30/2012  . Seizures ([place])   . Stroke  [facility])     Assessment: 33 yoM with on coumadin PTA for Afib, now with GI bleed s/p flex sig on 3/1 where 6 clips were placed. Pharmacy consulted to start heparin and monitor patient for 24 hours. If well-tolerated then plans to transition to warfarin and discharge home.  - PM HL 0.61 now in goal range.  Goal of Therapy:  Heparin level 0.3-0.7 units/ml Monitor platelets by anticoagulation protocol: Yes   Plan:  - Continue IV heparin at 950 units/hr - Daily CBC/HL - Monitor for bleeding  Katheren Jimmerson S. Alford Highland, PharmD, [facility] Clinical Staff Pharmacist Pager [phone]  02/24/2017 9:30 PM

## 2017-02-24 NOTE — Progress Notes (Signed)
PROGRESS NOTE    [facility].  [id number] DOB: 02-13-1946 DOA: 02/19/2017 PCP: [name], MD    Brief Narrative: Louis Dandy. is a 71 y.o. male with a medical history of atrial fibrillation, hypertension, coronary artery disease, history of ischemic cardiomyopathy and systolic heart failure, seizure disorder, who presented to the emergency department with complaints of rectal bleeding. Patient had been having rectal bleeding profusely one month at which point he did see gastroenterologist. Patient is status post colonoscopy on 02/13/2017, with polypectomy. Since that time, he has been bleeding. He has not been on Lovenox or Coumadin since 02/14/2017. His hemoglobin dropped from baseline of 15 to 10.8 to 9.8. GI consulted and underwent  flex sigmoidoscopy on 2/28 and on 3/1.    Assessment & Plan:   Active Problems:   HTN (hypertension)   Coronary artery disease involving native heart   Ischemic cardiomyopathy  ? additional rate component   Seizure (HCC)   Chronic systolic heart failure (HCC)   Afib (HCC)   Hematochezia   GIB (gastrointestinal bleeding)   H/O colonoscopy with polypectomy   Acute GI bleeding, suspect lower GI bleeding: -S/p colonoscopy with polypectomy on 02/13/2017: Left-sided diverticulosis -Patient has been on lovenox (not since 02/14/2017) and coumadin (held since 02/07/2017)  No signs of abdominal pain or discomfort, but continued to have rectal bleeding.  GI consulted and he underwent flex sigmoidoscopy on 2.28 and on 3/1.  It showed bleeding from the polypectomy stalk and it was clipped for hemostasis and diverticulosis seen.  Recommendations to monitor hemoglobin and to transfuse if hemoglobin is less than 7.   1 unit of prbc transfusion ordered, and repeat hemoglobin is around 7 . Continue to monitor.  Restarting heparin and monitor for bleeding. No bm yet. Repeat hemoglobin in am.   Atrial fibrillation:  Rate controlled. Subtherapeutic  INR. CHADVASC score of 5.  Anticoagulation held for now in view of rectal bleeding. Resume amiodarone.     Hypertension: bp  Well controlled.   Hyperlipidemia -Continue statin  Seizure disorder -Continue Dilantin  History of coronary artery disease/ischemic cardiomyopathy -EF in 2013 was 15-20%. Has ICD placed. -Currently chest pain free, and euvolemic -Given current GI bleed, his meds Coreg, BiDil, Lasix, losartan, spironolactone were held. Restarted lasix today.  -Monitor intake and output, daily weights, restart meds slowly after GI bleeding improves.  -Last echocardiogram 02/27/2016 showed an EF of 0000000, grade 2 diastolic dysfunction  Mild thrombocytopenia: counts around 146 to 119. Monitor.   Right arm swelling: some tenderness and tingling of the arm.  Venous duplex of the right arm is negative for DVT or SVT.     DVT prophylaxis: scd's Code Status: DNR Family Communication: none at bedside.  Disposition Plan: HOME IN AM.   Consultants:   gi  Procedures:  Flex sig on 2/28  Antimicrobials: none.    Subjective: No rectal bleeding this am or last night.  Right arm swelling improving.   Objective: Vitals:   02/23/17 1724 02/23/17 2144 02/24/17 0454 02/24/17 0842  BP: 109/69 113/70 (!) 111/59 113/70  Pulse: 72 73 72 86  Resp: 18 18 18 18   Temp: 98.1 F (36.7 C) 98.2 F (36.8 C) 99 F (37.2 C) 98.5 F (36.9 C)  TempSrc: Oral Oral Oral Oral  SpO2: 100% 100% 100% 100%  Weight:  92.4 kg (203 lb 12.8 oz)    Height:  5\' 11"  (1.803 m)      Intake/Output Summary (Last 24 hours) at 02/24/17 1653  Last data filed at 02/24/17 0843  Gross per 24 hour  Intake           819.61 ml  Output             1725 ml  Net          -905.39 ml   Filed Weights   02/21/17 2025 02/22/17 2203 02/23/17 2144  Weight: 91.7 kg (202 lb 1.6 oz) 92.7 kg (204 lb 4.8 oz) 92.4 kg (203 lb 12.8 oz)    Examination:  General exam: Appears calm and comfortable  Respiratory  system: Clear to auscultation. Respiratory effort normal. Cardiovascular system: S1 & S2 heard,  No JVD, murmurs, rubs, gallops or clicks. No pedal edema. Gastrointestinal system: Abdomen is nondistended, soft and nontender. No organomegaly or masses felt. Normal bowel sounds heard. Central nervous system: Alert and oriented. No focal neurological deficits. Extremities: Symmetric 5 x 5 power.     Data Reviewed: I have personally reviewed following labs and imaging studies  CBC:  Recent Labs Lab 02/19/17 1457  02/20/17 0415  02/21/17 2030 02/22/17 0433 02/22/17 1441 02/23/17 0544 02/24/17 0527  WBC 5.3  --  4.9  --   --  5.6  --  5.9 5.6  HGB 10.6*  < > 9.6*  < > 7.0* 7.7* 7.5* 7.8* 8.2*  HCT 33.4*  < > 28.8*  < > 21.3* 23.3* 23.2* 23.9* 25.0*  MCV 92.0  --  90.6  --   --  91.7  --  92.6 92.6  PLT 127*  --  119*  --   --  123*  --  146* 169  < > = values in this interval not displayed. Basic Metabolic Panel:  Recent Labs Lab 02/19/17 1457 02/20/17 0415 02/24/17 0527  NA 136 137 138  K 4.6 4.8 4.1  CL 108 109 106  CO2 23 20* 26  GLUCOSE 95 65 92  BUN 20 23* 9  CREATININE 1.08 0.98 1.04  CALCIUM 8.6* 8.5* 8.1*   GFR: Estimated Creatinine Clearance: 78.9 mL/min (by C-G formula based on SCr of 1.04 mg/dL). Liver Function Tests:  Recent Labs Lab 02/19/17 1457  AST 18  ALT 18  ALKPHOS 75  BILITOT 0.5  PROT 6.6  ALBUMIN 3.3*   No results for input(s): LIPASE, AMYLASE in the last 168 hours. No results for input(s): AMMONIA in the last 168 hours. Coagulation Profile:  Recent Labs Lab 02/19/17 1350 02/19/17 2111  INR 1.1 1.29   Cardiac Enzymes: No results for input(s): CKTOTAL, CKMB, CKMBINDEX, TROPONINI in the last 168 hours. BNP (last 3 results) No results for input(s): PROBNP in the last 8760 hours. HbA1C: No results for input(s): HGBA1C in the last 72 hours. CBG:  Recent Labs Lab 02/20/17 1809 02/20/17 1834 02/21/17 1334 02/21/17 1644  02/21/17 1722  GLUCAP 43* 98 104* 76 93   Lipid Profile: No results for input(s): CHOL, HDL, LDLCALC, TRIG, CHOLHDL, LDLDIRECT in the last 72 hours. Thyroid Function Tests: No results for input(s): TSH, T4TOTAL, FREET4, T3FREE, THYROIDAB in the last 72 hours. Anemia Panel: No results for input(s): VITAMINB12, FOLATE, FERRITIN, TIBC, IRON, RETICCTPCT in the last 72 hours. Sepsis Labs:  Recent Labs Lab 02/19/17 1517  LATICACIDVEN 0.83    No results found for this or any previous visit (from the past 240 hour(s)).       Radiology Studies: No results found.      Scheduled Meds: . amiodarone  200 mg Oral Daily  . feeding supplement (  ENSURE ENLIVE)  237 mL Oral BID BM  . furosemide  20 mg Oral Daily  . phenytoin  100 mg Oral BID  . polyethylene glycol  17 g Oral Daily  . senna-docusate  2 tablet Oral BID  . simvastatin  20 mg Oral q1800  . sodium chloride flush  3 mL Intravenous Q12H  . triamcinolone cream  1 application Topical BID   Continuous Infusions: . heparin 950 Units/hr (02/24/17 1537)     LOS: 3 days    Time spent: 30 min    Rondel Episcopo, MD [facility] Pager [phone]  If 7PM-7AM, please contact night-coverage [email or web address] Password [email or web address] 02/24/2017, 4:53 PM

## 2017-02-24 NOTE — Progress Notes (Signed)
ANTICOAGULATION CONSULT NOTE Pharmacy Consult for Heparin Indication: atrial fibrillation  No Known Allergies  Patient Measurements: Height: 5\' 11"  (180.3 cm) Weight: 203 lb 12.8 oz (92.4 kg) IBW/kg (Calculated) : 75.3 Heparin Dosing Weight: 92.7 kg  Vital Signs: Temp: 99 F (37.2 C) (03/04 0454) Temp Source: Oral (03/04 0454) BP: 111/59 (03/04 0454) Pulse Rate: 72 (03/04 0454)  Labs:  Recent Labs  02/22/17 0433 02/22/17 1441 02/23/17 0544 02/23/17 1539 02/24/17 0527  HGB 7.7* 7.5* 7.8*  --  8.2*  HCT 23.3* 23.2* 23.9*  --  25.0*  PLT 123*  --  146*  --  169  HEPARINUNFRC  --   --   --  0.38 0.93*    Estimated Creatinine Clearance: 83.8 mL/min (by C-G formula based on SCr of 0.98 mg/dL).   Assessment: 71 y.o. male with h/o Afib, Coumadin on hold s/p polypectomy, for heparin  Goal of Therapy:  Heparin level 0.3-0.7 units/ml Monitor platelets by anticoagulation protocol: Yes   Plan:  Decrease Heparin 1200 units/hr Check heparin level in 6 hours.   Phillis Knack, PharmD, BCPS  02/24/2017 6:04 AM

## 2017-02-25 ENCOUNTER — Other Ambulatory Visit: Payer: Self-pay | Admitting: Internal Medicine

## 2017-02-25 LAB — CBC
HEMATOCRIT: 24.8 % — AB (ref 39.0–52.0)
HEMOGLOBIN: 8 g/dL — AB (ref 13.0–17.0)
MCH: 29.7 pg (ref 26.0–34.0)
MCHC: 32.3 g/dL (ref 30.0–36.0)
MCV: 92.2 fL (ref 78.0–100.0)
Platelets: 174 10*3/uL (ref 150–400)
RBC: 2.69 MIL/uL — AB (ref 4.22–5.81)
RDW: 14.2 % (ref 11.5–15.5)
WBC: 5.2 10*3/uL (ref 4.0–10.5)

## 2017-02-25 LAB — HEPARIN LEVEL (UNFRACTIONATED): HEPARIN UNFRACTIONATED: 0.42 [IU]/mL (ref 0.30–0.70)

## 2017-02-25 MED ORDER — WARFARIN SODIUM 6 MG PO TABS
6.0000 mg | ORAL_TABLET | Freq: Once | ORAL | Status: AC
  Administered 2017-02-25: 6 mg via ORAL
  Filled 2017-02-25: qty 1

## 2017-02-25 MED ORDER — SENNOSIDES-DOCUSATE SODIUM 8.6-50 MG PO TABS: 2.0000 | ORAL_TABLET | Freq: Two times a day (BID) | ORAL | 0 refills | Status: DC

## 2017-02-25 MED ORDER — ENOXAPARIN SODIUM 30 MG/0.3ML ~~LOC~~ SOLN: 90.0000 mg | Freq: Two times a day (BID) | SUBCUTANEOUS | 0 refills | Status: DC

## 2017-02-25 MED ORDER — BISACODYL 10 MG RE SUPP
10.0000 mg | Freq: Once | RECTAL | Status: DC
  Filled 2017-02-25: qty 1

## 2017-02-25 MED ORDER — ENOXAPARIN SODIUM 100 MG/ML ~~LOC~~ SOLN
90.0000 mg | Freq: Two times a day (BID) | SUBCUTANEOUS | Status: DC
  Administered 2017-02-25: 90 mg via SUBCUTANEOUS
  Filled 2017-02-25: qty 1

## 2017-02-25 MED ORDER — ENSURE ENLIVE PO LIQD: 237.0000 mL | Freq: Two times a day (BID) | ORAL | 12 refills | Status: DC

## 2017-02-25 MED ORDER — WARFARIN SODIUM 6 MG PO TABS: 6.0000 mg | ORAL_TABLET | Freq: Once | ORAL | Status: DC

## 2017-02-25 MED ORDER — POLYETHYLENE GLYCOL 3350 17 G PO PACK: 17.0000 g | PACK | Freq: Every day | ORAL | 0 refills | Status: DC | PRN

## 2017-02-25 MED ORDER — WARFARIN - PHARMACIST DOSING INPATIENT
Freq: Every day | Status: DC
  Administered 2017-02-25: 18:00:00

## 2017-02-25 NOTE — Care Management Important Message (Signed)
Important Message  Patient Details  Name: [facility] MRN: [id number] Date of Birth: [birth year]   Medicare Important Message Given:  Yes    Tanette Chauca 02/25/2017, 2:04 PM

## 2017-02-25 NOTE — Progress Notes (Signed)
ANTICOAGULATION CONSULT NOTE - Initial Consult  Pharmacy Consult for Lovenox and Coumadin Indication: atrial fibrillation  No Known Allergies  Patient Measurements: Height: 5\' 11"  (180.3 cm) Weight: 203 lb 8 oz (92.3 kg) IBW/kg (Calculated) : 75.3  Vital Signs: Temp: 97.7 F (36.5 C) (03/05 0629) Temp Source: Oral (03/05 0629) BP: 109/70 (03/05 0629) Pulse Rate: 69 (03/05 0629)  Labs:  Recent Labs  02/23/17 0544  02/24/17 0527 02/24/17 1436 02/24/17 2031 02/25/17 0638  HGB 7.8*  --  8.2*  --   --  8.0*  HCT 23.9*  --  25.0*  --   --  24.8*  PLT 146*  --  169  --   --  174  HEPARINUNFRC  --   < > 0.93* 0.88* 0.61 0.42  CREATININE  --   --  1.04  --   --   --   < > = values in this interval not displayed.  Estimated Creatinine Clearance: 78.9 mL/min (by C-G formula based on SCr of 1.04 mg/dL).   Medical History: Past Medical History:  Diagnosis Date  . AICD (automatic cardioverter/defibrillator) present   . Atrial fibrillation   09/22/2012  . CAD (coronary artery disease)   . Chronic systolic heart failure ([place])   . 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 GIB (gastrointestinal bleeding) 02/20/2017  . History of MRSA infection 05/2011  . HTN (hypertension)   . Hx of adenomatous colonic polyps 02/20/2017   01/2017 - 3 cm sigmoid TV adenoma and other smaller polyps - had post-polypectomy bleed Tx w/ clips Consider repeat colonoscopy 3 yrs [name], MD, [name]   . Hyperlipidemia   . implantable cardiac defibrillator-Biotronik    Device Implanted 2006; s/p gen change 03/2011 : bleeding persistent with pocket erosion and infection; explant and reimplant  06/2011  . Ischemic cardiomyopathy    EF 15 to 20% by TTE and TEE in 09/2012.  Severe LV dysfunction  . Obesity    BMI 31 in 09/2012  . Seizure disorder ([place]) latest 09/30/2012  . Seizures ([place])   . Stroke [facility])     Medications:  Scheduled:  . amiodarone  200 mg Oral  Daily  . feeding supplement (ENSURE ENLIVE)  237 mL Oral BID BM  . furosemide  20 mg Oral Daily  . phenytoin  100 mg Oral BID  . polyethylene glycol  17 g Oral Daily  . senna-docusate  2 tablet Oral BID  . simvastatin  20 mg Oral q1800  . sodium chloride flush  3 mL Intravenous Q12H  . triamcinolone cream  1 application Topical BID    Assessment: 70yo male on heparin for AFib, now to change therapy to Lovenox bridge to Coumadin.  He has been stable on Heparin with no bleeding.  Hg is stable, pltc wnl.  Heparin level was within goal range this AM.  Plan is for d/c home, so will give Coumadin prior.  Goal of Therapy:  INR 2-3 Anti-Xa level 0.6-1 units/ml 4hrs after LMWH dose given Monitor platelets by anticoagulation protocol: Yes   Plan:  D/C heparin and all heparin labs Start Lovenox 90mg  SQ q12, first dose 1hr after heparin stopped Coumadin 6mg  po x 1, then resume home dose tomorrow Daily INR, change CBC to q72 Recommend repeat INR on 3/7   Gracy Bruins, PharmD Clinical Pharmacist [facility]

## 2017-02-25 NOTE — Progress Notes (Signed)
Discharge instructions (including medications) discussed with and copy provided to patient/caregiver 

## 2017-02-26 NOTE — Discharge Summary (Signed)
Physician Discharge Summary  [facility]. [id number] DOB: 12/23/1946 DOA: 02/19/2017  PCP: [name], MD  Admit date: 02/19/2017 Discharge date: 02/25/2017  Admitted From: Home.  Disposition: Home.  Recommendations for Outpatient Follow-up:  1. Follow up with PCP in 1-2 weeks 2. Please obtain BMP/CBC in one week 3. Please follow u pwith INR check in 2 days.  4. Please follow up with gastroenterology as needed.       Discharge Condition:stable.  CODE STATUS:DNR Diet recommendation: Heart Healthy / Carb Modified   Brief/Interim Summary: [facility]is a 71 y.o.malewith a medical history of atrial fibrillation, hypertension, coronary artery disease, history of ischemic cardiomyopathy and systolic heart failure, seizure disorder, who presented to the emergency department with complaints of rectal bleeding. Patient had been having rectal bleeding profusely one month at which point he did see gastroenterologist. Patient is status post colonoscopy on 02/13/2017, with polypectomy. Since that time, he has been bleeding. He has not been on Lovenox or Coumadin since 02/14/2017. His hemoglobin dropped from baseline of 15 to 10.8 to 9.8. GI consulted and underwent  flex sigmoidoscopy on 2/28 and on 3/1.   Discharge Diagnoses:  Active Problems:   HTN (hypertension)   Coronary artery disease involving native heart   Ischemic cardiomyopathy  ? additional rate component   Seizure (HCC)   Chronic systolic heart failure (HCC)   Afib (HCC)   Hematochezia   GIB (gastrointestinal bleeding)   H/O colonoscopy with polypectomy  Acute GI bleeding, suspect lower GI bleeding: -S/p colonoscopy with polypectomy on 02/13/2017: Left-sided diverticulosis -Patient has been on lovenox (not since 02/14/2017) and coumadin (held since 02/07/2017)  No signs of abdominal pain or discomfort, but continued to have rectal bleeding.  GI consulted and he underwent flex sigmoidoscopy on 2.28 and on 3/1.   It showed bleeding from the polypectomy stalk and it was clipped for hemostasis and diverticulosis seen.  Recommendations to monitor hemoglobin and to transfuse if hemoglobin is less than 7.   1 unit of prbc transfusion ordered, and repeat hemoglobin is around 7 .  Restarting heparin and no rectal bleed so far. One BM on the day of discharge. . Repeat hemoglobin in am stable at 8.  Atrial fibrillation:  Rate controlled. Subtherapeutic INR. CHADVASC score of 5.  Anticoagulation held for now in view of rectal bleeding., heparin was restarted and he was discharged on lovenox and coumadin.  Resume amiodarone.     Hypertension: bp  Well controlled.   Hyperlipidemia -Continue statin  Seizure disorder -Continue Dilantin  History of coronary artery disease/ischemic cardiomyopathy -EF in 2013 was 15-20%. Has ICD placed. -Currently chest pain free, and euvolemic -Given current GI bleed, his meds Coreg, BiDil, Lasix, losartan, spironolactone were held. Restarted lasix today.  -Monitor intake and output, daily weights, restarted meds slowly after GI bleeding improves.  -Last echocardiogram 02/27/2016 showed an EF of 0000000, grade 2 diastolic dysfunction  Mild thrombocytopenia: counts around 146 to 119. Monitor.   Right arm swelling: some tenderness and tingling of the arm.  Venous duplex of the right arm is negative for DVT or SVT.    Discharge Instructions  Discharge Instructions    Diet - low sodium heart healthy    Complete by:  As directed    Discharge instructions    Complete by:  As directed    Follow up with PCP in one week.  Follow up with gastroenterology in 1 to 2 weeks.     Allergies as of 02/25/2017  No Known Allergies     Medication List    STOP taking these medications   HYDROcodone-acetaminophen 5-325 MG tablet Commonly known as:  NORCO/VICODIN     TAKE these medications   acetaminophen 325 MG tablet Commonly known as:  TYLENOL Take 650 mg by mouth  every 6 (six) hours as needed for mild pain.   amiodarone 200 MG tablet Commonly known as:  PACERONE Take 1 tablet (200 mg total) by mouth daily.   BIDIL 20-37.5 MG tablet Generic drug:  isosorbide-hydrALAZINE TAKE TWO TABLETS BY MOUTH THREE TIMES DAILY   carvedilol 3.125 MG tablet Commonly known as:  COREG Take 1 tablet (3.125 mg total) by mouth 2 (two) times daily.   enoxaparin 30 MG/0.3ML injection Commonly known as:  LOVENOX Inject 0.9 mLs (90 mg total) into the skin every 12 (twelve) hours.   feeding supplement (ENSURE ENLIVE) Liqd Take 237 mLs by mouth 2 (two) times daily between meals.   furosemide 40 MG tablet Commonly known as:  LASIX Take 1 tablet (40 mg total) by mouth daily.   losartan 25 MG tablet Commonly known as:  COZAAR Take 1 tablet (25 mg total) by mouth daily.   multivitamin with minerals Tabs tablet Take 1 tablet by mouth daily.   nystatin 100000 UNIT/ML suspension Commonly known as:  MYCOSTATIN SWISH, GARGLE AND SPIT 5 TO 10 ML BY MOUTH EVERY 6 HOURS AS NEEDED What changed:  how much to take  how to take this  when to take this  reasons to take this  additional instructions   phenytoin 100 MG ER capsule Commonly known as:  DILANTIN Take 1 capsule (100 mg total) by mouth 2 (two) times daily.   polyethylene glycol packet Commonly known as:  MIRALAX / GLYCOLAX Take 17 g by mouth daily as needed.   senna-docusate 8.6-50 MG tablet Commonly known as:  Senokot-S Take 2 tablets by mouth 2 (two) times daily.   simvastatin 20 MG tablet Commonly known as:  ZOCOR Take 1 tablet (20 mg total) by mouth daily at 6 PM.   spironolactone 25 MG tablet Commonly known as:  ALDACTONE Take 0.5 tablets (12.5 mg total) by mouth daily.   traMADol 50 MG tablet Commonly known as:  ULTRAM Take 1 tablet (50 mg total) by mouth every 8 (eight) hours as needed. What changed:  reasons to take this   vitamin C 100 MG tablet Take 100 mg by mouth daily.    warfarin 4 MG tablet Commonly known as:  COUMADIN Take as directed by Coumadin Clinic What changed:  how much to take  how to take this  when to take this  additional instructions      Follow-up Information    [name], MD. Schedule an appointment as soon as possible for a visit in 1 week(s).   Specialty:  Internal Medicine Contact information: [place] [id number] [phone]          No Known Allergies  Consultations:  Gastroenterology.    Procedures/Studies:  No results found. Flex sigmoidoscopy.    Subjective: BM today, no new complaints.   Discharge Exam: Vitals:   02/25/17 0629 02/25/17 0900  BP: 109/70 108/77  Pulse: 69 70  Resp: 18 18  Temp: 97.7 F (36.5 C) 97.9 F (36.6 C)   Vitals:   02/24/17 1819 02/24/17 2249 02/25/17 0629 02/25/17 0900  BP: 104/64 109/77 109/70 108/77  Pulse: 78 68 69 70  Resp: 18 18 18  18  Temp: 98.2 F (36.8 C) 98.4 F (36.9 C) 97.7 F (36.5 C) 97.9 F (36.6 C)  TempSrc: Oral Oral Oral Oral  SpO2: 100% 100% 95% 96%  Weight:  92.3 kg (203 lb 8 oz)    Height:  5\' 11"  (1.803 m)      General: Pt is alert, awake, not in acute distress Cardiovascular: RRR, S1/S2 +, no rubs, no gallops Respiratory: CTA bilaterally, no wheezing, no rhonchi Abdominal: Soft, NT, ND, bowel sounds + Extremities: no edema, no cyanosis    The results of significant diagnostics from this hospitalization (including imaging, microbiology, ancillary and laboratory) are listed below for reference.     Microbiology: No results found for this or any previous visit (from the past 240 hour(s)).   Labs: BNP (last 3 results)  Recent Labs  03/08/16 1206 10/25/16 0914 11/08/16 1100  BNP 176.3* 56.7 99991111*   Basic Metabolic Panel:  Recent Labs Lab 02/19/17 1457 02/20/17 0415 02/24/17 0527  NA 136 137 138  K 4.6 4.8 4.1  CL 108 109 106  CO2 23 20* 26  GLUCOSE 95 65 92  BUN 20 23* 9   CREATININE 1.08 0.98 1.04  CALCIUM 8.6* 8.5* 8.1*   Liver Function Tests:  Recent Labs Lab 02/19/17 1457  AST 18  ALT 18  ALKPHOS 75  BILITOT 0.5  PROT 6.6  ALBUMIN 3.3*   No results for input(s): LIPASE, AMYLASE in the last 168 hours. No results for input(s): AMMONIA in the last 168 hours. CBC:  Recent Labs Lab 02/20/17 0415  02/22/17 0433 02/22/17 1441 02/23/17 0544 02/24/17 0527 02/25/17 0638  WBC 4.9  --  5.6  --  5.9 5.6 5.2  HGB 9.6*  < > 7.7* 7.5* 7.8* 8.2* 8.0*  HCT 28.8*  < > 23.3* 23.2* 23.9* 25.0* 24.8*  MCV 90.6  --  91.7  --  92.6 92.6 92.2  PLT 119*  --  123*  --  146* 169 174  < > = values in this interval not displayed. Cardiac Enzymes: No results for input(s): CKTOTAL, CKMB, CKMBINDEX, TROPONINI in the last 168 hours. BNP: Invalid input(s): POCBNP CBG:  Recent Labs Lab 02/20/17 1809 02/20/17 1834 02/21/17 1334 02/21/17 1644 02/21/17 1722  GLUCAP 43* 98 104* 76 93   D-Dimer No results for input(s): DDIMER in the last 72 hours. Hgb A1c No results for input(s): HGBA1C in the last 72 hours. Lipid Profile No results for input(s): CHOL, HDL, LDLCALC, TRIG, CHOLHDL, LDLDIRECT in the last 72 hours. Thyroid function studies No results for input(s): TSH, T4TOTAL, T3FREE, THYROIDAB in the last 72 hours.  Invalid input(s): FREET3 Anemia work up No results for input(s): VITAMINB12, FOLATE, FERRITIN, TIBC, IRON, RETICCTPCT in the last 72 hours. Urinalysis    Component Value Date/Time   COLORURINE YELLOW 02/27/2016 1552   APPEARANCEUR CLEAR 02/27/2016 1552   LABSPEC 1.012 02/27/2016 1552   PHURINE 5.5 02/27/2016 1552   GLUCOSEU NEGATIVE 02/27/2016 1552   HGBUR NEGATIVE 02/27/2016 1552   BILIRUBINUR NEGATIVE 02/27/2016 1552   KETONESUR NEGATIVE 02/27/2016 1552   PROTEINUR NEGATIVE 02/27/2016 1552   UROBILINOGEN 1.0 10/13/2013 1855   NITRITE NEGATIVE 02/27/2016 1552   LEUKOCYTESUR NEGATIVE 02/27/2016 1552   Sepsis Labs Invalid input(s):  PROCALCITONIN,  WBC,  LACTICIDVEN Microbiology No results found for this or any previous visit (from the past 240 hour(s)).   Time coordinating discharge: Over 30 minutes  SIGNED:   Hosie Poisson, MD  [facility] 02/26/2017, 8:56 AM Pager   If 7PM-7AM,  please contact night-coverage [email or web address] Password [email or web address]

## 2017-02-27 ENCOUNTER — Telehealth: Payer: Self-pay | Admitting: *Deleted

## 2017-02-27 ENCOUNTER — Ambulatory Visit (INDEPENDENT_AMBULATORY_CARE_PROVIDER_SITE_OTHER): Payer: Medicare Other | Admitting: *Deleted

## 2017-02-27 DIAGNOSIS — Z5181 Encounter for therapeutic drug level monitoring: Secondary | ICD-10-CM | POA: Diagnosis not present

## 2017-02-27 DIAGNOSIS — I4891 Unspecified atrial fibrillation: Secondary | ICD-10-CM

## 2017-02-27 DIAGNOSIS — Z7901 Long term (current) use of anticoagulants: Secondary | ICD-10-CM

## 2017-02-27 LAB — POCT INR: INR: 1.1

## 2017-02-27 MED ORDER — ENOXAPARIN SODIUM 100 MG/ML ~~LOC~~ SOLN: 100.0000 mg | Freq: Two times a day (BID) | SUBCUTANEOUS | 1 refills | Status: DC

## 2017-02-27 NOTE — Telephone Encounter (Signed)
Please address refill after NP appt 3/7 Thanks

## 2017-02-27 NOTE — Telephone Encounter (Signed)
Transition Care Management Follow-up Telephone Call   Date discharged? 02/25/17   How have you been since you were released from the hospital? Called pt he states he is    Do you understand why you were in the hospital? YES   Do you understand the discharge instructions? NO   Where were you discharged to? Home   Items Reviewed:  Medications reviewed: YES  Allergies reviewed: YES  Dietary changes reviewed: NO  Referrals reviewed: No referral needed   Functional Questionnaire:   Activities of Daily Living (ADLs):   He states he are independent in the following: ambulation, bathing and hygiene, feeding, continence, grooming, toileting and dressing States he doesn't require assistance    Any transportation issues/concerns?: NO   Any patient concerns? NO   Confirmed importance and date/time of follow-up visits scheduled YES, appt 03/08/17  Provider Appointment booked with Dr. Sharlet Salina  Confirmed with patient if condition begins to worsen call PCP or go to the ER.  Patient was given the office number and encouraged to call back with question or concerns.  : YES

## 2017-02-28 ENCOUNTER — Other Ambulatory Visit: Payer: Self-pay

## 2017-02-28 ENCOUNTER — Encounter: Payer: Self-pay | Admitting: *Deleted

## 2017-02-28 NOTE — Consult Note (Signed)
Call and  spoke with the patient who states he was in the bathroom and asked to speak with his wife, Mardene Celeste, @ [phone].  Spoke with Mardene Celeste, HIPAA verified.  Mardene Celeste states that they have been married for 7 months and she is seeing some issues that makes her believe the patient is in need of some counseling.  She states she has called [facility] and they said he may need to just follow up with a community referral.  Explained that [facility] can have a social worker follow up with her regarding resources.  Will forward the request for services.  For questions, please contact:  Natividad Brood, RN BSN [facility] Liaison  [phone] business mobile phone Toll free office [phone]

## 2017-02-28 NOTE — Consult Note (Signed)
Call and  spoke with the patient who states he was in the bathroom and asked to speak with his wife, Mardene Celeste, @ [phone].  Spoke with Mardene Celeste, HIPAA verified.  Mardene Celeste states that they have been married for 7 months and she is seeing some issues that makes her believe the patient is in need of some counseling.  She states she has called [facility] and they said he may need to just follow up with a community referral. Wife states that the MD office had already called and he has a follow up appointment and with his Coumadin lab work.   She endorse Dr. Pricilla Holm as the primary care provider.   Explained that [facility] can have a social worker follow up with her regarding resources.  Will forward the request for services.  For questions, please contact:  Natividad Brood, RN BSN [facility] Liaison  [phone] business mobile phone Toll free office [phone]

## 2017-03-01 ENCOUNTER — Other Ambulatory Visit: Payer: Self-pay | Admitting: *Deleted

## 2017-03-01 NOTE — Patient Outreach (Signed)
[facility]) Care Management  03/01/2017  Keithan Dileonardo. 1946-08-17 [id number]   CSW made an initial attempt to try and contact patient today to perform phone assessment, as well as assess and assist with social needs and services, without success.  A HIPAA complaint message was left for patient on voicemail.  CSW is currently awaiting a return call.  CSW will make a second outreach attempt in one week, if CSW does not receive a return call from patient in the meantime. Nat Christen, BSW, MSW, LCSW  Licensed Education officer, [facility]  Mailing [place] [address], [place], [place] [address] Physical Address-[address], [place], [place] [address] Toll Free Main # [phone] Fax # [phone] Cell # [phone]  Office # [phone] [email or web address]

## 2017-03-05 ENCOUNTER — Other Ambulatory Visit: Payer: Self-pay

## 2017-03-05 ENCOUNTER — Ambulatory Visit (INDEPENDENT_AMBULATORY_CARE_PROVIDER_SITE_OTHER): Payer: Medicare Other | Admitting: Pharmacist

## 2017-03-05 DIAGNOSIS — Z5181 Encounter for therapeutic drug level monitoring: Secondary | ICD-10-CM

## 2017-03-05 LAB — POCT INR: INR: 1.4

## 2017-03-05 NOTE — Patient Outreach (Signed)
[facility]) Care Management  03/05/2017  [facility] [birth year] [phone]   Per hospital liaison update.  Patient has been referred to [facility] care management social worker.   PLAN; RNCM has notified care management assistant to close patient to telephonic case management follow up.  RNCM will send update to patients primary MD.  Quinn Plowman RN,BSN,CCM [facility] Telephonic  [phone]

## 2017-03-08 ENCOUNTER — Inpatient Hospital Stay: Payer: Medicare Other | Admitting: Internal Medicine

## 2017-03-08 ENCOUNTER — Other Ambulatory Visit: Payer: Self-pay | Admitting: *Deleted

## 2017-03-08 NOTE — Patient Outreach (Signed)
[facility]) Care Management  03/08/2017  Louis Ford. 1946-08-20 [id number]   CSW made a second attempt to try and contact patient today to perform phone assessment, as well as assess and assist with social needs and services, without success.  A HIPAA complaint message was left for patient on voicemail.  CSW is currently awaiting a return call.  CSW will make a third and final outreach attempt in one week, if CSW does not receive a return call from patient in the meantime. Nat Christen, BSW, MSW, LCSW  Licensed Education officer, [facility]  Mailing [place] [address] [place], [place] [address] Physical Address-[address], [place], [place] [address] Toll Free Main # [phone] Fax # [phone] Cell # [phone]  Office # [phone] [email or web address]

## 2017-03-11 ENCOUNTER — Ambulatory Visit (INDEPENDENT_AMBULATORY_CARE_PROVIDER_SITE_OTHER): Payer: Medicare Other | Admitting: *Deleted

## 2017-03-11 DIAGNOSIS — Z5181 Encounter for therapeutic drug level monitoring: Secondary | ICD-10-CM

## 2017-03-11 LAB — POCT INR: INR: 1.4

## 2017-03-14 ENCOUNTER — Encounter (HOSPITAL_COMMUNITY): Payer: Self-pay | Admitting: Internal Medicine

## 2017-03-14 ENCOUNTER — Ambulatory Visit (HOSPITAL_COMMUNITY)
Admission: RE | Admit: 2017-03-14 | Discharge: 2017-03-14 | Disposition: A | Discharge status: Home / Self Care | Payer: Medicare Other | Source: Ambulatory Visit | Attending: Internal Medicine | Admitting: Internal Medicine

## 2017-03-14 VITALS — BP 103/64 | HR 71 | Wt 206.0 lb

## 2017-03-14 DIAGNOSIS — Z87891 Personal history of nicotine dependence: Secondary | ICD-10-CM | POA: Insufficient documentation

## 2017-03-14 DIAGNOSIS — R55 Syncope and collapse: Secondary | ICD-10-CM | POA: Diagnosis not present

## 2017-03-14 DIAGNOSIS — F819 Developmental disorder of scholastic skills, unspecified: Secondary | ICD-10-CM | POA: Insufficient documentation

## 2017-03-14 DIAGNOSIS — M109 Gout, unspecified: Secondary | ICD-10-CM | POA: Diagnosis not present

## 2017-03-14 DIAGNOSIS — Z8614 Personal history of Methicillin resistant Staphylococcus aureus infection: Secondary | ICD-10-CM | POA: Insufficient documentation

## 2017-03-14 DIAGNOSIS — I130 Hypertensive heart and chronic kidney disease with heart failure and stage 1 through stage 4 chronic kidney disease, or unspecified chronic kidney disease: Secondary | ICD-10-CM | POA: Insufficient documentation

## 2017-03-14 DIAGNOSIS — I2510 Atherosclerotic heart disease of native coronary artery without angina pectoris: Secondary | ICD-10-CM

## 2017-03-14 DIAGNOSIS — Z951 Presence of aortocoronary bypass graft: Secondary | ICD-10-CM | POA: Insufficient documentation

## 2017-03-14 DIAGNOSIS — Z8261 Family history of arthritis: Secondary | ICD-10-CM | POA: Diagnosis not present

## 2017-03-14 DIAGNOSIS — E785 Hyperlipidemia, unspecified: Secondary | ICD-10-CM | POA: Diagnosis not present

## 2017-03-14 DIAGNOSIS — Z79899 Other long term (current) drug therapy: Secondary | ICD-10-CM | POA: Diagnosis not present

## 2017-03-14 DIAGNOSIS — Z7901 Long term (current) use of anticoagulants: Secondary | ICD-10-CM | POA: Insufficient documentation

## 2017-03-14 DIAGNOSIS — Z8673 Personal history of transient ischemic attack (TIA), and cerebral infarction without residual deficits: Secondary | ICD-10-CM | POA: Insufficient documentation

## 2017-03-14 DIAGNOSIS — Z8249 Family history of ischemic heart disease and other diseases of the circulatory system: Secondary | ICD-10-CM | POA: Diagnosis not present

## 2017-03-14 DIAGNOSIS — R0602 Shortness of breath: Secondary | ICD-10-CM | POA: Insufficient documentation

## 2017-03-14 DIAGNOSIS — Z823 Family history of stroke: Secondary | ICD-10-CM | POA: Insufficient documentation

## 2017-03-14 DIAGNOSIS — G40909 Epilepsy, unspecified, not intractable, without status epilepticus: Secondary | ICD-10-CM | POA: Insufficient documentation

## 2017-03-14 DIAGNOSIS — Z9889 Other specified postprocedural states: Secondary | ICD-10-CM | POA: Insufficient documentation

## 2017-03-14 DIAGNOSIS — E669 Obesity, unspecified: Secondary | ICD-10-CM | POA: Diagnosis not present

## 2017-03-14 DIAGNOSIS — D682 Hereditary deficiency of other clotting factors: Secondary | ICD-10-CM | POA: Diagnosis not present

## 2017-03-14 DIAGNOSIS — Z95810 Presence of automatic (implantable) cardiac defibrillator: Secondary | ICD-10-CM | POA: Insufficient documentation

## 2017-03-14 DIAGNOSIS — I5022 Chronic systolic (congestive) heart failure: Secondary | ICD-10-CM | POA: Diagnosis not present

## 2017-03-14 DIAGNOSIS — N183 Chronic kidney disease, stage 3 (moderate): Secondary | ICD-10-CM | POA: Diagnosis not present

## 2017-03-14 DIAGNOSIS — I480 Paroxysmal atrial fibrillation: Secondary | ICD-10-CM | POA: Diagnosis not present

## 2017-03-14 DIAGNOSIS — I255 Ischemic cardiomyopathy: Secondary | ICD-10-CM | POA: Diagnosis not present

## 2017-03-14 LAB — COMPREHENSIVE METABOLIC PANEL
ALBUMIN: 3.4 g/dL — AB (ref 3.5–5.0)
ALT: 23 U/L (ref 17–63)
AST: 22 U/L (ref 15–41)
Alkaline Phosphatase: 85 U/L (ref 38–126)
Anion gap: 5 (ref 5–15)
BUN: 17 mg/dL (ref 6–20)
CHLORIDE: 107 mmol/L (ref 101–111)
CO2: 24 mmol/L (ref 22–32)
Calcium: 8.5 mg/dL — ABNORMAL LOW (ref 8.9–10.3)
Creatinine, Ser: 1.2 mg/dL (ref 0.61–1.24)
GFR calc Af Amer: >60 mL/min (ref >60)
GLUCOSE: 104 mg/dL — AB (ref 65–99)
POTASSIUM: 4.5 mmol/L (ref 3.5–5.1)
Sodium: 136 mmol/L (ref 135–145)
Total Bilirubin: 0.4 mg/dL (ref 0.3–1.2)
Total Protein: 7 g/dL (ref 6.5–8.1)

## 2017-03-14 LAB — T4, FREE: Free T4: 1.19 ng/dL — ABNORMAL HIGH (ref 0.61–1.12)

## 2017-03-14 LAB — CBC
HEMATOCRIT: 27.6 % — AB (ref 39.0–52.0)
Hemoglobin: 8.5 g/dL — ABNORMAL LOW (ref 13.0–17.0)
MCH: 26.2 pg (ref 26.0–34.0)
MCHC: 30.8 g/dL (ref 30.0–36.0)
MCV: 84.9 fL (ref 78.0–100.0)
Platelets: 270 10*3/uL (ref 150–400)
RBC: 3.25 MIL/uL — ABNORMAL LOW (ref 4.22–5.81)
RDW: 16.9 % — AB (ref 11.5–15.5)
WBC: 4.8 10*3/uL (ref 4.0–10.5)

## 2017-03-14 LAB — PROTIME-INR
INR: 1.51
Prothrombin Time: 18.3 seconds — ABNORMAL HIGH (ref 11.4–15.2)

## 2017-03-14 LAB — TSH: TSH: 5.614 u[IU]/mL — AB (ref 0.350–4.500)

## 2017-03-14 MED ORDER — APIXABAN 5 MG PO TABS: 5.0000 mg | ORAL_TABLET | Freq: Two times a day (BID) | ORAL | 6 refills | Status: DC

## 2017-03-14 NOTE — Patient Instructions (Signed)
Stop Coumadin  Start Eliquis 5 mg Twice daily STARTING TOMORROW 3/23  Labs today  We will contact you in 6 months to schedule your next appointment.

## 2017-03-14 NOTE — Progress Notes (Signed)
Patient ID: Sonny Dandy., male   DOB: [birth year], 71 y.o.   MRN: [id number]    Advanced Heart Failure Clinic Note   Primary Care: Dr Pricilla Holm Primary Cardiologist: Dr Lovena Le Primary HF: Dr. [facility]ine Laws  HPI: [facility] [name] is a 71 y.o. male with hx of CAD s/p CABG, ICM s/p Biotronik ICD, chronic systolic CHF Echo 66/06/30 LVEF 30-35% -> improved to 55-60% (3/17),HTN, HLD, atrial fibrillation, previous stroke, Factor 7 deficiency, and seizure disorder.  Admitted from [facility] in 10/16 with ADHF in setting of AF with RVR. He diuresed well initially on IV lasix 40 mg BID, but continued to have SOB and was thus on milrinoneon 10/14 for concerns of low output. RHC on 10/12/15 that showed depressed cardiac output with Fick CO/CI 3.6/1.6. Improved on milrinone and remained stable with wean. Bidil started and increased to goal dose.Also started on amio for afib rate control. D/c weight 229 lbs  Admitted 02/26/16 after two near syncopal episodes. ICD interrogation negative for any acute events. Suspect related to overdiuresis and hypotension with recurrent Afib.  Spontaneously converted to NSR without intervention. Echo EF 55-60%   Myoview done for CP 11/17 Nuclear stress EF: 39%. Inferior wall akinesis. Dilated left ventricle. Defect 1: There is a small defect of mild severity present in the apex location.  Admitted 2/18 with LGIB after polypectomy found to have bleeding from the stalk and was clipped.   He returns today for regular follow up. Feeling good. Occasional rib pain but no angina. Minimal dyspnea when walking. Wants to stop taking lovenox shots. Awaiting INR to come up. Nu further bleeding. Weight stable. Drinking lots of water and juice. No edema, orthopnea or PND.   RHC 10/12/15 RA = 7 RV = 28/1/8 PA = 32/15 (20) PCW = 12 Fick cardiac output/index = 3.6/1.6 Thermo cardiac output/index = 3.5/1.5 PVR = 2.2 WU PA sat = 44%, 47% Ao sat = 98%  Echo 10/05/15 LVEF  30-35%, Mild MR, PA peak pressure 34 mmHg Echo 02/27/16 LVEF 55-60%, Grade 2 DD dysfunction. LAE mildly dilated, PA peak pressure 35 mm Hg.  Myoview 11/17 EF 39%  Labs: 12/30/15 K 5.0, creatinine 1.3 Labs: 02/29/16 K 4.7, creatinine 1.27 Labs: 03/25/2016: K 4.3 Creatinine 1.31   Past Medical History:  Diagnosis Date  . AICD (automatic cardioverter/defibrillator) present   . Atrial fibrillation   09/22/2012  . CAD (coronary artery disease)   . Chronic systolic heart failure ([place])   . 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 GIB (gastrointestinal bleeding) 02/20/2017  . History of MRSA infection 05/2011  . HTN (hypertension)   . Hx of adenomatous colonic polyps 02/20/2017   01/2017 - 3 cm sigmoid TV adenoma and other smaller polyps - had post-polypectomy bleed Tx w/ clips Consider repeat colonoscopy 3 yrs [name], MD, [name]   . Hyperlipidemia   . implantable cardiac defibrillator-Biotronik    Device Implanted 2006; s/p gen change 03/2011 : bleeding persistent with pocket erosion and infection; explant and reimplant  06/2011  . Ischemic cardiomyopathy    EF 15 to 20% by TTE and TEE in 09/2012.  Severe LV dysfunction  . Obesity    BMI 31 in 09/2012  . Seizure disorder ([place]) latest 09/30/2012  . Seizures ([place])   . Stroke [facility])     Current Outpatient Prescriptions  Medication Sig Dispense Refill  . acetaminophen (TYLENOL) 325 MG tablet Take 650 mg by mouth every 6 (six)  hours as needed for mild pain.    [name] amiodarone (PACERONE) 200 MG tablet Take 1 tablet (200 mg total) by mouth daily. 30 tablet 3  . Ascorbic Acid (VITAMIN C) 100 MG tablet Take 100 mg by mouth daily.    [name] BIDIL 20-37.5 MG tablet TAKE TWO TABLETS BY MOUTH THREE TIMES DAILY 180 tablet 6  . carvedilol (COREG) 3.125 MG tablet Take 1 tablet (3.125 mg total) by mouth 2 (two) times daily. 180 tablet 3  . furosemide (LASIX) 40 MG tablet TAKE ONE TABLET BY MOUTH ONCE DAILY 90 tablet 3  .  losartan (COZAAR) 25 MG tablet Take 1 tablet (25 mg total) by mouth daily. 90 tablet 3  . nystatin (MYCOSTATIN) 100000 UNIT/ML suspension SWISH, GARGLE AND SPIT 5 TO 10 ML BY MOUTH EVERY 6 HOURS AS NEEDED (Patient taking differently: Use as directed 5 mLs in the mouth or throat 4 (four) times daily as needed (mouth). SWISH, GARGLE AND SPIT 5 TO 10 ML BY MOUTH EVERY 6 HOURS AS NEEDED) 240 mL 1  . phenytoin (DILANTIN) 100 MG ER capsule Take 1 capsule (100 mg total) by mouth 2 (two) times daily. 180 capsule 3  . polyethylene glycol (MIRALAX / GLYCOLAX) packet Take 17 g by mouth daily as needed. 14 each 0  . senna-docusate (SENOKOT-S) 8.6-50 MG tablet Take 2 tablets by mouth 2 (two) times daily. 10 tablet 0  . simvastatin (ZOCOR) 20 MG tablet Take 1 tablet (20 mg total) by mouth daily at 6 PM. 90 tablet 3  . spironolactone (ALDACTONE) 25 MG tablet Take 0.5 tablets (12.5 mg total) by mouth daily. 15 tablet 6  . traMADol (ULTRAM) 50 MG tablet Take 1 tablet (50 mg total) by mouth every 8 (eight) hours as needed. (Patient taking differently: Take 50 mg by mouth every 8 (eight) hours as needed for moderate pain. ) 30 tablet 0  . warfarin (COUMADIN) 4 MG tablet TAKE AS DIRECTED BY  COUMADIN  CLINIC 30 tablet 1   No current facility-administered medications for this encounter.     No Known Allergies    Social History   Social History  . Marital status: Married    Spouse name: N/A  . Number of children: N/A  . Years of education: N/A   Occupational History  . Not on file.   Social History Main Topics  . Smoking status: Former Smoker    Types: Pipe    Quit date: 09/21/2008  . Smokeless tobacco: Former Systems developer    Quit date: 09/21/2008  . Alcohol use No  . Drug use: No  . Sexual activity: No   Other Topics Concern  . Not on file   Social History Narrative   ** Merged History Encounter **          Family History  Problem Relation Age of Onset  . Arthritis Mother   . Heart disease Mother     . Heart attack Mother   . Other Father     smoker  . Hypertension Neg Hx     unknown  . Stroke Neg Hx     unknown    Vitals:   03/14/17 1439  BP: 103/64  Pulse: 71  SpO2: 100%  Weight: 206 lb (93.4 kg)     Wt Readings from Last 3 Encounters:  03/14/17 206 lb (93.4 kg)  02/24/17 203 lb 8 oz (92.3 kg)  02/13/17 215 lb (97.5 kg)     PHYSICAL EXAM: General:  Elderly appearing, NAD  HEENT: normal  Neck: supple. JVD 6-7 cm. Carotids 2+ bilat; no bruits No thyromegaly or nodule noted.  Cor: PMI nondisplaced. Mildly irregular 2/6 SEM Lungs: CTAB, normal effort.  No wheeze Abdomen: soft, NT, ND, no HSM. No bruits or masses. +BS several small hematomas from lovenox shots  Extremities: no cyanosis, clubbing, rash. No ankle edema  Neuro: alert & oriented x 3, cranial nerves grossly intact. moves all 4 extremities w/o difficulty. Affect pleasant.  ASSESSMENT & PLAN:  1. Chronic Systolic CHF, Echo 07/31/10 Echo improved to 55-60% from echo 10/05/15 LVEF 30-35% - Myoview 11/17 EF 39% - NYHA class II.  Volume status looks good - Continue lasix 40 mg daily.  Can take extra as needed.  - Continue carvedilol 3.125 bid - Continue spiro 12.5 mg daily.  - Continue losartan 25 mg daily. BP too low to adjust or switch to Entresto - Continue Bidil 2 tabs TID.  - will repeat echo to reassess EF given discrepancy 2. Paroxysmal Atrial Fibrillation  - Remains in NSR - Continue amiodarone 200 mg daily.  - Sop coumadin switch to Eliquis 5 bid - Check TFTs and LFTs 3. CAD s/p CABG - Myoview reviewed. No significant ischemia - Continue current medical therapy.  4. HTN - Stable on current medications.  5. CKD stage III - Last creatinine 1.04 6. Gout - On allopurinol 200 mg daily. Followed by PCP  7. Learning disability    Glori Bickers, MD 3:07 PM

## 2017-03-14 NOTE — Addendum Note (Signed)
Encounter addended by: Scarlette Calico, RN on: 03/14/2017  3:39 PM<BR>    Actions taken: Diagnosis association updated, Medication long-term status modified, Order list changed, Sign clinical note

## 2017-03-15 ENCOUNTER — Telehealth (HOSPITAL_COMMUNITY): Payer: Self-pay | Admitting: Pharmacist

## 2017-03-15 ENCOUNTER — Other Ambulatory Visit: Payer: Self-pay | Admitting: *Deleted

## 2017-03-15 ENCOUNTER — Encounter: Payer: Self-pay | Admitting: *Deleted

## 2017-03-15 ENCOUNTER — Ambulatory Visit: Payer: Medicare Other | Admitting: Internal Medicine

## 2017-03-15 MED ORDER — EDOXABAN TOSYLATE 60 MG PO TABS: 60.0000 mg | ORAL_TABLET | Freq: Every day | ORAL | 5 refills | Status: DC

## 2017-03-15 NOTE — Telephone Encounter (Signed)
Eliquis and most of the other DOACs interact with phenytoin since it is a CYP3A4 and PGP inducer which significantly reduces the serum concentrations of the DOACs. [name] is the only DOAC that carries a lower risk of this interaction (package insert only recommends against the concomitant use of rifampin). Since Mr. Louis Ford CrCl ~75 ml/min currently, he is a candidate for Savaysa 60 mg daily for his atrial fibrillation. After discussion with Dr. [facility]ine Laws, will switch him to [facility]. Patient aware of change.   Ruta Hinds. Velva Harman, PharmD, BCPS, CPP Clinical Pharmacist Pager: [phone] Phone: [phone] 03/15/2017 10:52 AM

## 2017-03-15 NOTE — Patient Outreach (Signed)
[facility]) Care Management  03/15/2017  Louis Ford. 06-02-1946 [id number]   CSW was able to make initial contact with patient today to perform phone assessment, as well as assess and assist with social work needs and services.  CSW introduced self, explained role and types of services provided through [facility] ([facility]).  CSW further explained to patient that CSW works with Hospital Liaison/RNCM, also with [facility], Natividad Brood. CSW then explained the reason for the call, indicating that [name] thought that patient would benefit from social work services and resources to assist with counseling and supportive services for symptoms of Depression.  CSW obtained two HIPAA compliant identifiers from patient, which included patient's name and date of birth. Patient reported, "I'm in a very good place right now", denying experiencing any symptoms of anxiety and depression.  CSW completed the PHQ 2 & 9 with patient, in which he scored a 0.  Patient was not interested in having CSW offer counseling and/or supportive services, nor was patient interested in having CSW make a referral to a therapist/counselor on his behalf.  Last, patient did not feel it necessary for CSW to mail him resource information pertaining to anxiety and depression.  CSW provided patient with CSW's contact information, encouraging patient to contact CSW directly if social work needs arise in the near future.  Patient voiced understanding and was agreeable to this plan. CSW will perform a case closure on patient, as all goals of treatment have been met from social work standpoint and no additional social work needs have been identified at this time.  CSW will fax an update to patient's Primary Care Physician, Dr. Pricilla Holm to ensure that they are aware of CSW's involvement with patient's plan of care.  CSW will submit a case closure request to  Verlon Setting, Care Management Assistant with [facility], in the form of an In [employer].   Nat Christen, BSW, MSW, LCSW  Licensed Education officer, [facility]  Mailing [place] [address], [place], [place] [address] Physical Address-[address], [place], [place] [address] Toll Free Main # [phone] Fax # [phone] Cell # [phone]  Office # [phone] [email or web address]

## 2017-03-16 LAB — T3, FREE: T3, Free: 1.9 pg/mL — ABNORMAL LOW (ref 2.0–4.4)

## 2017-03-19 ENCOUNTER — Telehealth (HOSPITAL_COMMUNITY): Payer: Self-pay | Admitting: Pharmacist

## 2017-03-19 NOTE — Telephone Encounter (Signed)
Savaysa PA approved by [employer] through 12/23/17.   Ruta Hinds. Velva Harman, PharmD, BCPS, CPP Clinical Pharmacist Pager: [phone] Phone: [phone] 03/19/2017 8:49 AM

## 2017-03-26 ENCOUNTER — Other Ambulatory Visit (HOSPITAL_COMMUNITY): Payer: Self-pay | Admitting: Internal Medicine

## 2017-03-26 ENCOUNTER — Other Ambulatory Visit: Payer: Self-pay | Admitting: Internal Medicine

## 2017-03-26 ENCOUNTER — Other Ambulatory Visit (HOSPITAL_COMMUNITY): Payer: Self-pay | Admitting: Student

## 2017-03-26 DIAGNOSIS — I5023 Acute on chronic systolic (congestive) heart failure: Secondary | ICD-10-CM

## 2017-03-29 ENCOUNTER — Other Ambulatory Visit (HOSPITAL_COMMUNITY): Payer: Medicare Other

## 2017-04-09 ENCOUNTER — Ambulatory Visit (HOSPITAL_COMMUNITY): Payer: Medicare Other | Attending: Cardiovascular Disease

## 2017-04-09 ENCOUNTER — Other Ambulatory Visit: Payer: Self-pay

## 2017-04-09 DIAGNOSIS — I081 Rheumatic disorders of both mitral and tricuspid valves: Secondary | ICD-10-CM | POA: Diagnosis not present

## 2017-04-09 DIAGNOSIS — I5022 Chronic systolic (congestive) heart failure: Secondary | ICD-10-CM | POA: Insufficient documentation

## 2017-04-11 ENCOUNTER — Telehealth: Payer: Self-pay

## 2017-04-11 DIAGNOSIS — K922 Gastrointestinal hemorrhage, unspecified: Secondary | ICD-10-CM

## 2017-04-11 NOTE — Telephone Encounter (Signed)
-----   Message from [name], MD sent at 04/11/2017  1:03 PM EDT ----- Regarding: RE: f/u after bleed Please ask him/wife to recheck CBC and a ferritin Next week or so  Dx acute blood loss anemia ----- Message ----- From: Marlon Pel, RN Sent: 03/15/2017  11:21 AM To: [name], MD Subject: RE: f/u after bleed                            He did have one yesterday at Dr. Belenda Cruise office.  Hgb 8.5 ----- Message ----- From: [name], MD Sent: 03/15/2017  11:12 AM To: Marlon Pel, RN Subject: f/u after bleed                                I thought he was going to get a CBC to f/u post-polypectomy bleed Please ask him to have one drawn.  Dx acute blood loss anemia  Thanks

## 2017-04-11 NOTE — Telephone Encounter (Signed)
Letter mailed.  No answer at his number

## 2017-04-16 ENCOUNTER — Other Ambulatory Visit: Payer: Self-pay | Admitting: Internal Medicine

## 2017-04-17 ENCOUNTER — Other Ambulatory Visit (INDEPENDENT_AMBULATORY_CARE_PROVIDER_SITE_OTHER): Payer: Medicare Other

## 2017-04-17 DIAGNOSIS — K922 Gastrointestinal hemorrhage, unspecified: Secondary | ICD-10-CM | POA: Diagnosis not present

## 2017-04-17 LAB — CBC WITH DIFFERENTIAL/PLATELET
Basophils Relative: 0 % (ref 0.0–3.0)
EOS PCT: 7 % — AB (ref 0.0–5.0)
HEMATOCRIT: 28 % — AB (ref 39.0–52.0)
Hemoglobin: 8.5 g/dL — ABNORMAL LOW (ref 13.0–17.0)
Lymphocytes Relative: 27 % (ref 12.0–46.0)
MCHC: 30.3 g/dL (ref 30.0–36.0)
MCV: 74.4 fl — ABNORMAL LOW (ref 78.0–100.0)
Monocytes Relative: 10 % (ref 3.0–12.0)
Neutrophils Relative %: 56 % (ref 43.0–77.0)
PLATELETS: 235 10*3/uL (ref 150.0–400.0)
RBC: 3.76 Mil/uL — ABNORMAL LOW (ref 4.22–5.81)
RDW: 24.2 % — AB (ref 11.5–15.5)
WBC: 4 10*3/uL (ref 4.0–10.5)

## 2017-04-17 LAB — FERRITIN: Ferritin: 13 ng/mL — ABNORMAL LOW (ref 22.0–322.0)

## 2017-04-18 ENCOUNTER — Encounter: Payer: Self-pay | Admitting: Internal Medicine

## 2017-04-18 DIAGNOSIS — D500 Iron deficiency anemia secondary to blood loss (chronic): Secondary | ICD-10-CM

## 2017-04-18 HISTORY — DX: Iron deficiency anemia secondary to blood loss (chronic): D50.0

## 2017-04-18 NOTE — Progress Notes (Signed)
Blood count is stable but low Iron is low  Can take ferrous sulfate 325 mg bid which is slower abd cheaper way to build up but if having dyspnea and weakness then would  recommend feraheme x 2 and then recheck CBC and ferritin in 1 month (regardless) He or wife can choose and if they have ? Let me know

## 2017-04-19 ENCOUNTER — Telehealth: Payer: Self-pay | Admitting: Internal Medicine

## 2017-04-19 ENCOUNTER — Other Ambulatory Visit: Payer: Self-pay

## 2017-04-19 DIAGNOSIS — D509 Iron deficiency anemia, unspecified: Secondary | ICD-10-CM

## 2017-04-19 MED ORDER — FERROUS SULFATE 325 (65 FE) MG PO TABS: 325.0000 mg | ORAL_TABLET | Freq: Two times a day (BID) | ORAL | 3 refills | Status: DC

## 2017-04-19 NOTE — Telephone Encounter (Signed)
See result note.  

## 2017-04-24 ENCOUNTER — Ambulatory Visit (INDEPENDENT_AMBULATORY_CARE_PROVIDER_SITE_OTHER): Payer: Medicare Other | Admitting: *Deleted

## 2017-04-24 DIAGNOSIS — I255 Ischemic cardiomyopathy: Secondary | ICD-10-CM

## 2017-04-24 NOTE — Progress Notes (Signed)
Remote ICD transmission.   

## 2017-04-25 ENCOUNTER — Encounter: Payer: Self-pay | Admitting: Cardiology

## 2017-04-26 LAB — CUP PACEART REMOTE DEVICE CHECK
Battery Voltage: 2.97 V
Brady Statistic RV Percent Paced: 0 %
HighPow Impedance: 50 Ohm
Implantable Lead Implant Date: 20060927
Implantable Lead Location: 753860
Implantable Lead Model: 351
Implantable Pulse Generator Implant Date: 20120413
Lead Channel Setting Pacing Amplitude: 2 V
Lead Channel Setting Pacing Pulse Width: 0.4 ms
MDC IDC LEAD SERIAL: 10260064
MDC IDC MSMT BATTERY REMAINING PERCENTAGE: 29 %
MDC IDC MSMT LEADCHNL RV IMPEDANCE VALUE: 414 Ohm
MDC IDC MSMT LEADCHNL RV PACING THRESHOLD AMPLITUDE: 0.6 V
MDC IDC MSMT LEADCHNL RV PACING THRESHOLD PULSEWIDTH: 0.4 ms
MDC IDC SESS DTM: 20180504054003
Pulse Gen Serial Number: 60613417

## 2017-05-29 ENCOUNTER — Other Ambulatory Visit (HOSPITAL_COMMUNITY): Payer: Self-pay | Admitting: Student

## 2017-07-03 ENCOUNTER — Other Ambulatory Visit: Payer: Self-pay | Admitting: Internal Medicine

## 2017-07-03 ENCOUNTER — Other Ambulatory Visit (HOSPITAL_COMMUNITY): Payer: Self-pay | Admitting: Student

## 2017-07-05 ENCOUNTER — Telehealth (HOSPITAL_COMMUNITY): Payer: Self-pay | Admitting: *Deleted

## 2017-07-05 NOTE — Telephone Encounter (Signed)
Patient's wife called and stated that they are unable to use the PAN foundation for his Bidil and they are unable to afford it. I have left samples for them to pick up today and will have Doroteo Bradford look into it and call them back @ [phone].

## 2017-07-10 NOTE — Telephone Encounter (Signed)
Renewed PAN foundation for assistance with Bidil copays. Verified $0 copay with [employer].   Member ID: [id number] Group ID: [id number] RxBin ID: [id number] PCN: [id number] Eligibility Start Date: 06/19/2017 Eligibility End Date: 06/18/2018 Assistance Amount: $800.00   Doroteo Bradford K. Velva Harman, PharmD, BCPS, CPP Clinical Pharmacist Pager: [phone] Phone: [phone] 07/10/2017 2:00 PM

## 2017-07-10 NOTE — Addendum Note (Signed)
Addended by: Adora Fridge on: 07/10/2017 02:02 PM   Modules accepted: Orders

## 2017-07-16 ENCOUNTER — Ambulatory Visit (INDEPENDENT_AMBULATORY_CARE_PROVIDER_SITE_OTHER): Payer: Medicare Other | Admitting: Internal Medicine

## 2017-07-16 ENCOUNTER — Other Ambulatory Visit (INDEPENDENT_AMBULATORY_CARE_PROVIDER_SITE_OTHER): Payer: Medicare Other

## 2017-07-16 ENCOUNTER — Encounter: Payer: Self-pay | Admitting: Internal Medicine

## 2017-07-16 ENCOUNTER — Other Ambulatory Visit: Payer: Medicare Other

## 2017-07-16 VITALS — BP 158/98 | HR 71 | Ht 71.0 in | Wt 217.0 lb

## 2017-07-16 DIAGNOSIS — G8929 Other chronic pain: Secondary | ICD-10-CM

## 2017-07-16 DIAGNOSIS — Z0000 Encounter for general adult medical examination without abnormal findings: Secondary | ICD-10-CM

## 2017-07-16 DIAGNOSIS — E785 Hyperlipidemia, unspecified: Secondary | ICD-10-CM

## 2017-07-16 DIAGNOSIS — I10 Essential (primary) hypertension: Secondary | ICD-10-CM

## 2017-07-16 DIAGNOSIS — M25562 Pain in left knee: Secondary | ICD-10-CM

## 2017-07-16 DIAGNOSIS — R21 Rash and other nonspecific skin eruption: Secondary | ICD-10-CM | POA: Diagnosis not present

## 2017-07-16 LAB — HEPATIC FUNCTION PANEL
ALT: 13 U/L (ref 0–53)
AST: 15 U/L (ref 0–37)
Albumin: 4.1 g/dL (ref 3.5–5.2)
Alkaline Phosphatase: 99 U/L (ref 39–117)
BILIRUBIN DIRECT: 0 mg/dL (ref 0.0–0.3)
BILIRUBIN TOTAL: 0.4 mg/dL (ref 0.2–1.2)
Total Protein: 7.8 g/dL (ref 6.0–8.3)

## 2017-07-16 LAB — LIPID PANEL
CHOLESTEROL: 158 mg/dL (ref 0–200)
HDL: 51.9 mg/dL (ref >39.00)
LDL CALC: 90 mg/dL (ref 0–99)
NonHDL: 106.28
TRIGLYCERIDES: 82 mg/dL (ref 0.0–149.0)
Total CHOL/HDL Ratio: 3
VLDL: 16.4 mg/dL (ref 0.0–40.0)

## 2017-07-16 MED ORDER — SIMVASTATIN 20 MG PO TABS: 20.0000 mg | ORAL_TABLET | Freq: Every day | ORAL | 1 refills | Status: DC

## 2017-07-16 MED ORDER — DICLOFENAC SODIUM 1 % TD GEL: 4.0000 g | Freq: Four times a day (QID) | TRANSDERMAL | 5 refills | Status: DC | PRN

## 2017-07-16 MED ORDER — TRIAMCINOLONE ACETONIDE 0.1 % EX CREA: 1.0000 "application " | TOPICAL_CREAM | Freq: Two times a day (BID) | CUTANEOUS | 0 refills | Status: DC

## 2017-07-16 NOTE — Patient Instructions (Signed)
Please take all new medication as prescribed - the cream for the rash, and the generic Voltaren gel for knee pain  You will be contacted regarding the referral for: Dr Smith/sports medicine for the left knee  Please continue all other medications as before, and refills have been done if requested.  Please have the pharmacy call with any other refills you may need.  Please continue your efforts at being more active, low cholesterol diet, and weight control.  Please keep your appointments with your specialists as you may have planned  Please go to the LAB in the Basement (turn left off the elevator) for the tests to be done today  You will be contacted by phone if any changes need to be made immediately.  Otherwise, you will receive a letter about your results with an explanation, but please check with MyChart first.  Please remember to sign up for MyChart if you have not done so, as this will be important to you in the future with finding out test results, communicating by private email, and scheduling acute appointments online when needed.  Please return in 3 months, or sooner if needed, with Lab testing done 3-5 days before, to Dr Sharlet Salina

## 2017-07-16 NOTE — Assessment & Plan Note (Signed)
To bilat arms in kissing areas of concern but no arm contact or contact dermatitis, does sit with arms in lap much of the day - suspect irritated/hypersensitive areas - for triam cr prn

## 2017-07-16 NOTE — Progress Notes (Signed)
Subjective:    Patient ID: Louis Ford., male    DOB: 06-12-1946, 71 y.o.   MRN: [id number]  HPI  Here with family to assist, c/o mild itchy rash with simple small red bumps only located between the wrist and elbows bilat , non vesicular, nonpainful and prior hx of same.  He does spend much time every day with both arms in his lap on top of the legs. No fever or red streaks or swelling.  No recent obvious contact allergens. Nohting seems to make better or worse otherwise.  Since he is here, he also c/o left knee pain worsening over the last 6 months with intermittent swelling, but no giveaways or falls.  Walks with cane, no recent falls.  Pain usually about 6/10.  Better to sit, worse to stand up or walk.  Pt denies fever, wt loss, night sweats, loss of appetite, or other constitutional symptoms.  Pt denies chest pain, increased sob or doe, wheezing, orthopnea, PND, increased LE swelling, palpitations, dizziness or syncope.   Pt denies polydipsia, polyuria   Past Medical History:  Diagnosis Date  . AICD (automatic cardioverter/defibrillator) present   . Atrial fibrillation   09/22/2012  . Blood loss anemia 04/18/2017   After GI bleed from colonoscopy and polypectomy  . CAD (coronary artery disease)   . Chronic systolic heart failure ([place])   . 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 Factor VII deficiency ([place]) 10/07/2012  . GIB (gastrointestinal bleeding) 02/20/2017  . History of MRSA infection 05/2011  . HTN (hypertension)   . Hx of adenomatous colonic polyps 02/20/2017   01/2017 - 3 cm sigmoid TV adenoma and other smaller polyps - had post-polypectomy bleed Tx w/ clips Consider repeat colonoscopy 3 yrs [name], MD, [name]   . Hyperlipidemia   . implantable cardiac defibrillator-Biotronik    Device Implanted 2006; s/p gen change 03/2011 : bleeding persistent with pocket erosion and infection; explant and reimplant  06/2011  . Ischemic cardiomyopathy    EF 15 to 20% by TTE and TEE in  09/2012.  Severe LV dysfunction  . Obesity    BMI 31 in 09/2012  . Seizure disorder ([place]) latest 09/30/2012  . Seizures ([place])   . Stroke [facility])    Past Surgical History:  Procedure Laterality Date  . CARDIAC CATHETERIZATION N/A 10/12/2015   Procedure: Right Heart Cath;  Surgeon: [name], MD;  Location: [facility];  Service: Cardiovascular;  Laterality: N/A;  . CARDIOVERSION  09/24/2012   Procedure: CARDIOVERSION;  Surgeon: [name], MD;  Location: [facility];  Service: Cardiovascular;  Laterality: N/A;  . COLONOSCOPY W/ POLYPECTOMY  02/13/2017  . CORONARY ARTERY BYPASS GRAFT    . FLEXIBLE SIGMOIDOSCOPY N/A 02/20/2017   Procedure: FLEXIBLE SIGMOIDOSCOPY;  Surgeon: [name], MD;  Location[facility] ENDOSCOPY;  Service: Endoscopy;  Laterality: N/A;  . FLEXIBLE SIGMOIDOSCOPY N/A 02/21/2017   Procedure: FLEXIBLE SIGMOIDOSCOPY;  Surgeon: [name], MD;  Location: [facility] ENDOSCOPY;  Service: Endoscopy;  Laterality: N/A;  . ICD    . TEE WITHOUT CARDIOVERSION  09/24/2012   Procedure: TRANSESOPHAGEAL ECHOCARDIOGRAM (TEE);  Surgeon: [name], MD;  Location: [facility] ENDOSCOPY;  Service: Cardiovascular;  Laterality: N/A;  dave/anesth, dl, cindy/echo     reports that he quit smoking about 8 years ago. His smoking use included Pipe. He quit smokeless tobacco use about 8 years ago. He reports that he does not drink alcohol or use drugs. family history includes Arthritis  in his mother; Heart attack in his mother; Heart disease in his mother; Other in his father. No Known Allergies Current Outpatient Prescriptions on File Prior to Visit  Medication Sig Dispense Refill  . acetaminophen (TYLENOL) 325 MG tablet Take 650 mg by mouth every 6 (six) hours as needed for mild pain.    [name] amiodarone (PACERONE) 200 MG tablet Take 1 tablet (200 mg total) by mouth daily. 30 tablet 3  . Ascorbic Acid (VITAMIN C) 100 MG tablet Take 100 mg by mouth daily.    [name] BIDIL 20-37.5 MG tablet TAKE TWO  TABLETS BY MOUTH THREE TIMES DAILY 180 tablet 6  . carvedilol (COREG) 3.125 MG tablet Take 1 tablet (3.125 mg total) by mouth 2 (two) times daily. 180 tablet 3  . edoxaban (SAVAYSA) 60 MG TABS tablet Take 60 mg by mouth daily. 30 tablet 5  . ferrous sulfate 325 (65 FE) MG tablet Take 1 tablet (325 mg total) by mouth 2 (two) times daily with a meal. 60 tablet 3  . furosemide (LASIX) 40 MG tablet TAKE ONE TABLET BY MOUTH ONCE DAILY 90 tablet 3  . losartan (COZAAR) 25 MG tablet TAKE ONE TABLET BY MOUTH ONCE DAILY 90 tablet 3  . nystatin (MYCOSTATIN) 100000 UNIT/ML suspension SWISH,GARGLE AND SPIT OUT 5-10 MILLILITERS EVERY 6 HOURS AS NEEDED 240 mL 0  . phenytoin (DILANTIN) 100 MG ER capsule Take 1 capsule (100 mg total) by mouth 2 (two) times daily. 180 capsule 3  . polyethylene glycol (MIRALAX / GLYCOLAX) packet Take 17 g by mouth daily as needed. 14 each 0  . senna-docusate (SENOKOT-S) 8.6-50 MG tablet Take 2 tablets by mouth 2 (two) times daily. 10 tablet 0  . spironolactone (ALDACTONE) 25 MG tablet TAKE ONE-HALF TABLET BY MOUTH ONCE DAILY 45 tablet 3  . traMADol (ULTRAM) 50 MG tablet Take 1 tablet (50 mg total) by mouth every 8 (eight) hours as needed. (Patient taking differently: Take 50 mg by mouth every 8 (eight) hours as needed for moderate pain. ) 30 tablet 0   No current facility-administered medications on file prior to visit.    Review of Systems  Constitutional: Negative for other unusual diaphoresis or sweats HENT: Negative for ear discharge or swelling Eyes: Negative for other worsening visual disturbances Respiratory: Negative for stridor or other swelling  Gastrointestinal: Negative for worsening distension or other blood Genitourinary: Negative for retention or other urinary change Musculoskeletal: Negative for other MSK pain or swelling Skin: Negative for color change or other new lesions Neurological: Negative for worsening tremors and other numbness  Psychiatric/Behavioral:  Negative for worsening agitation or other fatigue All other system neg per pt    Objective:   Physical Exam BP (!) 158/98   Pulse 71   Ht 5\' 11"  (1.803 m)   Wt 217 lb (98.4 kg)   SpO2 98%   BMI 30.27 kg/m  VS noted,  Constitutional: Pt appears in NAD HENT: Head: NCAT.  Right Ear: External ear normal.  Left Ear: External ear normal.  Eyes: . Pupils are equal, round, and reactive to light. Conjunctivae and EOM are normal Nose: without d/c or deformity Neck: Neck supple. Gross normal ROM Cardiovascular: Normal rate and regular rhythm.   Pulmonary/Chest: Effort normal and breath sounds without rales or wheezing.  Neurological: Pt is alert. At baseline orientation, motor grossly intact Skin: Skin is warm. + nontender nonvesicular rash with multiple < 20 per arm small < 5 mm erythema nonraised lesions to both arms between the  elbows and wrists only, , no other new lesions, no LE edema Psychiatric: Pt behavior is normal without agitation  No other exam findings    Assessment & Plan:

## 2017-07-17 NOTE — Assessment & Plan Note (Signed)
Mild elevated, o/w stable overall by history and exam, recent data reviewed with pt, and pt to continue medical treatment as before - declines med change today,  to f/u any worsening symptoms or concerns BP Readings from Last 3 Encounters:  07/16/17 (!) 158/98  03/14/17 103/64  02/25/17 108/77

## 2017-07-17 NOTE — Assessment & Plan Note (Signed)
D/w prob DJD, for volt gel prn, consider sport medicine at this office follow up

## 2017-07-17 NOTE — Assessment & Plan Note (Signed)
stable overall by history and exam, recent data reviewed with pt, and pt to continue medical treatment as before,  to f/u any worsening symptoms or concerns Lab Results  Component Value Date   LDLCALC 90 07/16/2017

## 2017-07-24 ENCOUNTER — Ambulatory Visit (INDEPENDENT_AMBULATORY_CARE_PROVIDER_SITE_OTHER): Payer: Medicare Other | Admitting: *Deleted

## 2017-07-24 DIAGNOSIS — H2513 Age-related nuclear cataract, bilateral: Secondary | ICD-10-CM | POA: Diagnosis not present

## 2017-07-24 DIAGNOSIS — H25013 Cortical age-related cataract, bilateral: Secondary | ICD-10-CM | POA: Diagnosis not present

## 2017-07-24 DIAGNOSIS — I255 Ischemic cardiomyopathy: Secondary | ICD-10-CM

## 2017-07-24 DIAGNOSIS — H25043 Posterior subcapsular polar age-related cataract, bilateral: Secondary | ICD-10-CM | POA: Diagnosis not present

## 2017-07-24 DIAGNOSIS — H40013 Open angle with borderline findings, low risk, bilateral: Secondary | ICD-10-CM | POA: Diagnosis not present

## 2017-07-24 DIAGNOSIS — H18413 Arcus senilis, bilateral: Secondary | ICD-10-CM | POA: Diagnosis not present

## 2017-07-24 NOTE — Progress Notes (Signed)
Remote ICD transmission.   

## 2017-07-25 ENCOUNTER — Encounter: Payer: Self-pay | Admitting: Cardiology

## 2017-08-01 ENCOUNTER — Other Ambulatory Visit: Payer: Self-pay | Admitting: Internal Medicine

## 2017-08-01 ENCOUNTER — Encounter: Payer: Self-pay | Admitting: Family Medicine

## 2017-08-01 ENCOUNTER — Ambulatory Visit (INDEPENDENT_AMBULATORY_CARE_PROVIDER_SITE_OTHER): Payer: Medicare Other | Admitting: Family Medicine

## 2017-08-01 DIAGNOSIS — M1712 Unilateral primary osteoarthritis, left knee: Secondary | ICD-10-CM | POA: Diagnosis not present

## 2017-08-01 NOTE — Assessment & Plan Note (Signed)
Severe arthritis. 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 Does have instability. Injected today to hopefully help with some of the effusion. Discussed icing regimen and home exercises. We discussed objective is a doing which was to avoid. Patient come back and see me again in 4 weeks. Could be a candidate for viscous supplementation

## 2017-08-01 NOTE — Progress Notes (Signed)
[facility] [address] [place], [place] [address] Phone: [phone] Subjective:    I'm seeing this patient by the request  of:   [name], MD   CC: left knee pain   EHU:[id number]  [facility] is a 71 y.o. male coming in with complaint of Left knee pain. Patient has had pain in this for quite some time patient states that he is having increasing instability. States that most the pain seems to be on the medial aspect of the knee. Sometimes has some instability. Worse with activity better with rest. Rates the severity of pain a 5 out of 10. Not responding over-the-counter medications.      Past Medical History:  Diagnosis Date  . AICD (automatic cardioverter/defibrillator) present   . Atrial fibrillation   09/22/2012  . Blood loss anemia 04/18/2017   After GI bleed from colonoscopy and polypectomy  . CAD (coronary artery disease)   . Chronic systolic heart failure ([place])   . 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 GIB (gastrointestinal bleeding) 02/20/2017  . History of MRSA infection 05/2011  . HTN (hypertension)   . Hx of adenomatous colonic polyps 02/20/2017   01/2017 - 3 cm sigmoid TV adenoma and other smaller polyps - had post-polypectomy bleed Tx w/ clips Consider repeat colonoscopy 3 yrs [name], MD, [name]   . Hyperlipidemia   . implantable cardiac defibrillator-Biotronik    Device Implanted 2006; s/p gen change 03/2011 : bleeding persistent with pocket erosion and infection; explant and reimplant  06/2011  . Ischemic cardiomyopathy    EF 15 to 20% by TTE and TEE in 09/2012.  Severe LV dysfunction  . Obesity    BMI 31 in 09/2012  . Seizure disorder ([place]) latest 09/30/2012  . Seizures ([place])   . Stroke [facility])    Past Surgical History:  Procedure Laterality Date  . CARDIAC CATHETERIZATION N/A 10/12/2015   Procedure: Right Heart Cath;  Surgeon: [name], MD;  Location:  [facility] [facility];  Service: Cardiovascular;  Laterality: N/A;  . CARDIOVERSION  09/24/2012   Procedure: CARDIOVERSION;  Surgeon: [name], MD;  Location: [facility];  Service: Cardiovascular;  Laterality: N/A;  . COLONOSCOPY W/ POLYPECTOMY  02/13/2017  . CORONARY ARTERY BYPASS GRAFT    . FLEXIBLE SIGMOIDOSCOPY N/A 02/20/2017   Procedure: FLEXIBLE SIGMOIDOSCOPY;  Surgeon: [name], MD;  [facility] ENDOSCOPY;  Service: Endoscopy;  Laterality: N/A;  . FLEXIBLE SIGMOIDOSCOPY N/A 02/21/2017   Procedure: FLEXIBLE SIGMOIDOSCOPY;  Surgeon: [name], MD;  Location: [facility] ENDOSCOPY;  Service: Endoscopy;  Laterality: N/A;  . ICD    . TEE WITHOUT CARDIOVERSION  09/24/2012   Procedure: TRANSESOPHAGEAL ECHOCARDIOGRAM (TEE);  Surgeon: [name], MD;  Location: [facility] ENDOSCOPY;  Service: Cardiovascular;  Laterality: N/A;  dave/anesth, dl, cindy/echo    Social History   Social History  . Marital status: Married    Spouse name: N/A  . Number of children: N/A  . Years of education: N/A   Social History Main Topics  . Smoking status: Former Smoker    Types: Pipe    Quit date: 09/21/2008  . Smokeless tobacco: Former Systems developer    Quit date: 09/21/2008  . Alcohol use No  . Drug use: No  . Sexual activity: No   Other Topics Concern  . None   Social History Narrative   ** Merged History Encounter **  No Known Allergies Family History  Problem Relation Age of Onset  . Arthritis Mother   . Heart disease Mother   . Heart attack Mother   . Other Father        smoker  . Hypertension Neg Hx        unknown  . Stroke Neg Hx        unknown     Past medical history, social, surgical and family history all reviewed in electronic medical record.  No pertanent information unless stated regarding to the chief complaint.   Review of Systems:Review of systems updated and as accurate as of 08/01/17  No headache, visual changes, nausea, vomiting, diarrhea, constipation, dizziness,  abdominal pain, skin rash, fevers, chills, night sweats, weight loss, swollen lymph nodes,  joint swelling,chest pain, shortness of breath, mood changes. Positive muscle aches and body aches  Objective  Blood pressure 132/86, pulse 72, height 5\' 11"  (1.803 m), weight 212 lb (96.2 kg), SpO2 98 %. Systems examined below as of 08/01/17   General: No apparent distress alert and oriented x3 mood and affect normal, dressed appropriately.  HEENT: Pupils equal, extraocular movements intact  Respiratory: Patient's speak in full sentences and does not appear short of breath  Cardiovascular: No lower extremity edema, non tender, no erythema  Skin: Warm dry intact with no signs of infection or rash on extremities or on axial skeleton.  Abdomen: Soft nontender  Neuro: Cranial nerves II through XII are intact, neurovascularly intact in all extremities with 2+ DTRs and 2+ pulses.  Lymph: No lymphadenopathy of posterior or anterior cervical chain or axillae bilaterally.  Gait normal with good balance and coordination.  MSK:  Non tender with full range of motion and good stability and symmetric strength and tone of shoulders, elbows, wrist, hip, knee and ankles bilaterally.  Knee: Left valgus deformity noted. Large thigh to calf ratio. Effusion noted Tender to palpation over medial and PF joint line.  ROM full in flexion and extension and lower leg rotation. instability with valgus force.  painful patellar compression. Patellar glide with moderate crepitus. Patellar and quadriceps tendons unremarkable. Hamstring and quadriceps strength is normal. Contralateral knee shows arthritic changes but no pain  After informed written and verbal consent, patient was seated on exam table. Left knee was prepped with alcohol swab and utilizing anterolateral approach, patient's left knee space was injected with 4:1  marcaine 0.5%: Kenalog 40mg /dL. Patient tolerated the procedure well without immediate complications.     Impression and Recommendations:     This case required medical decision making of moderate complexity.      Note: This dictation was prepared with Dragon dictation along with smaller phrase technology. Any transcriptional errors that result from this process are unintentional.

## 2017-08-01 NOTE — Patient Instructions (Signed)
Good to see you  Ice 20 minutes 2 times daily. Usually after activity and before bed. Over the counter get  Vitamin D 2000 IU daily Tart cherry extract any dose at night We will get the brace for you  See me again in 4 weeks to make sure you are doing well.

## 2017-08-29 ENCOUNTER — Ambulatory Visit: Payer: Medicare Other | Admitting: Family Medicine

## 2017-08-30 ENCOUNTER — Other Ambulatory Visit: Payer: Self-pay | Admitting: Internal Medicine

## 2017-08-30 ENCOUNTER — Other Ambulatory Visit (HOSPITAL_COMMUNITY): Payer: Self-pay | Admitting: Internal Medicine

## 2017-08-30 LAB — CUP PACEART REMOTE DEVICE CHECK
Brady Statistic RV Percent Paced: 0 %
Date Time Interrogation Session: 20180907053106
HighPow Impedance: 57 Ohm
Implantable Lead Implant Date: 20060927
Implantable Lead Location: 753860
Implantable Lead Model: 351
Implantable Lead Serial Number: 10260064
Lead Channel Impedance Value: 450 Ohm
Lead Channel Pacing Threshold Amplitude: 0.6 V
Lead Channel Pacing Threshold Pulse Width: 0.4 ms
Lead Channel Setting Pacing Amplitude: 2 V
MDC IDC MSMT BATTERY REMAINING PERCENTAGE: 25 %
MDC IDC MSMT BATTERY VOLTAGE: 2.95 V
MDC IDC PG IMPLANT DT: 20120413
MDC IDC PG SERIAL: 60613417
MDC IDC SET LEADCHNL RV PACING PULSEWIDTH: 0.4 ms

## 2017-09-02 ENCOUNTER — Ambulatory Visit (INDEPENDENT_AMBULATORY_CARE_PROVIDER_SITE_OTHER): Payer: Medicare Other | Admitting: Nurse Practitioner

## 2017-09-02 ENCOUNTER — Encounter: Payer: Self-pay | Admitting: Nurse Practitioner

## 2017-09-02 VITALS — BP 114/80 | HR 75 | Temp 98.4°F | Ht 71.0 in | Wt 213.0 lb

## 2017-09-02 DIAGNOSIS — R0789 Other chest pain: Secondary | ICD-10-CM

## 2017-09-02 DIAGNOSIS — G4700 Insomnia, unspecified: Secondary | ICD-10-CM | POA: Diagnosis not present

## 2017-09-02 DIAGNOSIS — R222 Localized swelling, mass and lump, trunk: Secondary | ICD-10-CM

## 2017-09-02 DIAGNOSIS — G8929 Other chronic pain: Secondary | ICD-10-CM

## 2017-09-02 MED ORDER — DULOXETINE HCL 30 MG PO CPEP: 30.0000 mg | ORAL_CAPSULE | Freq: Every day | ORAL | 0 refills | Status: DC

## 2017-09-02 NOTE — Patient Instructions (Signed)
You will be contacted to schedule ABD US.   

## 2017-09-02 NOTE — Progress Notes (Signed)
Subjective:  Patient ID: [facility]., male    DOB: 01/18/1946  Age: 71 y.o. MRN: [id number]  CC: Pain (pain in chest area left side going down to the back. pt gets upset thats when the pain comes up? cant sleep at night. )   Insomnia  Primary symptoms: fragmented sleep, no difficulty falling asleep, frequent awakening, premature morning awakening, napping.  The current episode started more than one year. The onset quality is gradual. The problem occurs nightly. The problem is unchanged. The symptoms are aggravated by anxiety, bed partner and pain. How many beverages per day that contain caffeine: 0 - 1.  Types of beverages you drink: coffee. Nothing relieves the symptoms. Treatments tried: tylenol. Typical bedtime:  8-10 P.M..  How long after going to bed to you fall asleep: 15-30 minutes.   Duration of naps:  One to two hours.  PMH includes: hypertension, family stress or anxiety, chronic pain. Prior diagnostic workup includes:  No prior workup.  stays up all night watching TV. Wife thinks this is related to anxiety.  Chest wall pain: Chronic, intermittent, onset over 1yesr ago Affecting sleep. Triggered by high stress situation per wife  Left posterior torso mass: Present for over 1year, Has not change in size but has become painful. Not evaluated by another provider.  Outpatient Medications Prior to Visit  Medication Sig Dispense Refill  . acetaminophen (TYLENOL) 325 MG tablet Take 650 mg by mouth every 6 (six) hours as needed for mild pain.    [name] amiodarone (PACERONE) 200 MG tablet Take 1 tablet (200 mg total) by mouth daily. 30 tablet 3  . amiodarone (PACERONE) 200 MG tablet TAKE ONE TABLET BY MOUTH ONCE DAILY 30 tablet 6  . Ascorbic Acid (VITAMIN C) 100 MG tablet Take 100 mg by mouth daily.    [name] BIDIL 20-37.5 MG tablet TAKE TWO TABLETS BY MOUTH THREE TIMES DAILY 180 tablet 6  . carvedilol (COREG) 3.125 MG tablet Take 1 tablet (3.125 mg total) by mouth 2 (two) times daily. 180  tablet 3  . diclofenac sodium (VOLTAREN) 1 % GEL Apply 4 g topically 4 (four) times daily as needed. 100 g 5  . ferrous sulfate 325 (65 FE) MG tablet Take 1 tablet (325 mg total) by mouth 2 (two) times daily with a meal. 60 tablet 3  . furosemide (LASIX) 40 MG tablet TAKE ONE TABLET BY MOUTH ONCE DAILY 90 tablet 3  . losartan (COZAAR) 25 MG tablet TAKE ONE TABLET BY MOUTH ONCE DAILY 90 tablet 3  . nystatin (MYCOSTATIN) 100000 UNIT/ML suspension SWISH, GARGLE AND SPIT OUT 5-10 MILLILITERS EVERY 6 HOURS AS NEEDED 240 mL 0  . polyethylene glycol (MIRALAX / GLYCOLAX) packet Take 17 g by mouth daily as needed. 14 each 0  . SAVAYSA 60 MG TABS tablet TAKE 1 TABLET BY MOUTH ONCE DAILY 30 tablet 6  . senna-docusate (SENOKOT-S) 8.6-50 MG tablet Take 2 tablets by mouth 2 (two) times daily. 10 tablet 0  . simvastatin (ZOCOR) 20 MG tablet Take 1 tablet (20 mg total) by mouth daily at 6 PM. Overdue for annual appt w/labs must make appt for future refills 90 tablet 1  . spironolactone (ALDACTONE) 25 MG tablet TAKE ONE-HALF TABLET BY MOUTH ONCE DAILY 45 tablet 3  . triamcinolone cream (KENALOG) 0.1 % Apply 1 application topically 2 (two) times daily. 30 g 0  . phenytoin (DILANTIN) 100 MG ER capsule Take 1 capsule (100 mg total) by mouth 2 (two) times daily. [place]  capsule 3  . traMADol (ULTRAM) 50 MG tablet Take 1 tablet (50 mg total) by mouth every 8 (eight) hours as needed. (Patient taking differently: Take 50 mg by mouth every 8 (eight) hours as needed for moderate pain. ) 30 tablet 0   No facility-administered medications prior to visit.     ROS See HPI  Objective:  BP 114/80   Pulse 75   Temp 98.4 F (36.9 C)   Ht 5\' 11"  (1.803 m)   Wt 213 lb (96.6 kg)   SpO2 98%   BMI 29.71 kg/m   BP Readings from Last 3 Encounters:  09/02/17 114/80  08/01/17 132/86  07/16/17 (!) 158/98    Wt Readings from Last 3 Encounters:  09/02/17 213 lb (96.6 kg)  08/01/17 212 lb (96.2 kg)  07/16/17 217 lb (98.4 kg)      Physical Exam  Constitutional: He is oriented to person, place, and time. No distress.  Cardiovascular: Normal rate, regular rhythm and normal heart sounds.   Pulmonary/Chest: Effort normal and breath sounds normal. No respiratory distress.     He exhibits no tenderness.  Abdominal: Soft. Bowel sounds are normal. He exhibits mass. He exhibits no distension. There is no tenderness. There is no rebound.  Neurological: He is alert and oriented to person, place, and time.  Skin: Skin is warm and dry. No rash noted. No erythema.  Psychiatric: He has a normal mood and affect. His behavior is normal.  Vitals reviewed.   Lab Results  Component Value Date   WBC 4.0 04/17/2017   HGB 8.5 (L) 04/17/2017   HCT 28.0 (L) 04/17/2017   PLT 235.0 04/17/2017   GLUCOSE 104 (H) 03/14/2017   CHOL 158 07/16/2017   TRIG 82.0 07/16/2017   HDL 51.90 07/16/2017   LDLCALC 90 07/16/2017   ALT 13 07/16/2017   AST 15 07/16/2017   NA 136 03/14/2017   K 4.5 03/14/2017   CL 107 03/14/2017   CREATININE 1.20 03/14/2017   BUN 17 03/14/2017   CO2 24 03/14/2017   TSH 5.614 (H) 03/14/2017   INR 1.51 03/14/2017   HGBA1C 5.8 (H) 09/21/2012    No results found.  Assessment & Plan:   [name] was seen today for pain.  Diagnoses and all orders for this visit:  Insomnia, unspecified type -     DULoxetine (CYMBALTA) 30 MG capsule; Take 1 capsule (30 mg total) by mouth daily.  Chronic chest wall pain -     DULoxetine (CYMBALTA) 30 MG capsule; Take 1 capsule (30 mg total) by mouth daily.  Mass of torso Comments: left posterior Orders: -     US Abdomen Limited; Future   I have discontinued Mr. Mckelvin's traMADol. I am also having him start on DULoxetine. Additionally, I am having him maintain his amiodarone, acetaminophen, vitamin C, carvedilol, polyethylene glycol, senna-docusate, furosemide, spironolactone, losartan, ferrous sulfate, BIDIL, simvastatin, triamcinolone cream, diclofenac sodium, nystatin,  amiodarone, and SAVAYSA.  Meds ordered this encounter  Medications  . DULoxetine (CYMBALTA) 30 MG capsule    Sig: Take 1 capsule (30 mg total) by mouth daily.    Dispense:  30 capsule    Refill:  0    Order Specific Question:   Supervising Provider    Answer:   Cassandria Anger [[id number]]    Follow-up: Return in about 2 weeks (around 09/16/2017) for with dr. Sharlet Salina.  Wilfred Lacy, NP

## 2017-09-08 ENCOUNTER — Emergency Department (HOSPITAL_COMMUNITY): Payer: Medicare Other

## 2017-09-08 ENCOUNTER — Inpatient Hospital Stay (HOSPITAL_COMMUNITY)
Admission: EM | Admit: 2017-09-08 | Discharge: 2017-09-10 | DRG: 101 | Disposition: A | Discharge status: Home / Self Care | Payer: Medicare Other | Attending: Family Medicine | Admitting: Family Medicine

## 2017-09-08 ENCOUNTER — Encounter (HOSPITAL_COMMUNITY): Payer: Self-pay | Admitting: Emergency Medicine

## 2017-09-08 DIAGNOSIS — E875 Hyperkalemia: Secondary | ICD-10-CM | POA: Diagnosis present

## 2017-09-08 DIAGNOSIS — I2510 Atherosclerotic heart disease of native coronary artery without angina pectoris: Secondary | ICD-10-CM | POA: Diagnosis not present

## 2017-09-08 DIAGNOSIS — Z8614 Personal history of Methicillin resistant Staphylococcus aureus infection: Secondary | ICD-10-CM

## 2017-09-08 DIAGNOSIS — I110 Hypertensive heart disease with heart failure: Secondary | ICD-10-CM | POA: Diagnosis present

## 2017-09-08 DIAGNOSIS — I4891 Unspecified atrial fibrillation: Secondary | ICD-10-CM | POA: Diagnosis not present

## 2017-09-08 DIAGNOSIS — D682 Hereditary deficiency of other clotting factors: Secondary | ICD-10-CM | POA: Diagnosis present

## 2017-09-08 DIAGNOSIS — Z8673 Personal history of transient ischemic attack (TIA), and cerebral infarction without residual deficits: Secondary | ICD-10-CM | POA: Diagnosis not present

## 2017-09-08 DIAGNOSIS — Z79899 Other long term (current) drug therapy: Secondary | ICD-10-CM

## 2017-09-08 DIAGNOSIS — Z95810 Presence of automatic (implantable) cardiac defibrillator: Secondary | ICD-10-CM

## 2017-09-08 DIAGNOSIS — Z87891 Personal history of nicotine dependence: Secondary | ICD-10-CM

## 2017-09-08 DIAGNOSIS — Z6831 Body mass index (BMI) 31.0-31.9, adult: Secondary | ICD-10-CM | POA: Diagnosis not present

## 2017-09-08 DIAGNOSIS — R55 Syncope and collapse: Secondary | ICD-10-CM | POA: Diagnosis not present

## 2017-09-08 DIAGNOSIS — I130 Hypertensive heart and chronic kidney disease with heart failure and stage 1 through stage 4 chronic kidney disease, or unspecified chronic kidney disease: Secondary | ICD-10-CM | POA: Diagnosis not present

## 2017-09-08 DIAGNOSIS — R569 Unspecified convulsions: Secondary | ICD-10-CM | POA: Diagnosis not present

## 2017-09-08 DIAGNOSIS — G4700 Insomnia, unspecified: Secondary | ICD-10-CM | POA: Diagnosis not present

## 2017-09-08 DIAGNOSIS — I5022 Chronic systolic (congestive) heart failure: Secondary | ICD-10-CM | POA: Diagnosis present

## 2017-09-08 DIAGNOSIS — E785 Hyperlipidemia, unspecified: Secondary | ICD-10-CM | POA: Diagnosis not present

## 2017-09-08 DIAGNOSIS — Z66 Do not resuscitate: Secondary | ICD-10-CM | POA: Diagnosis present

## 2017-09-08 DIAGNOSIS — E871 Hypo-osmolality and hyponatremia: Secondary | ICD-10-CM | POA: Diagnosis not present

## 2017-09-08 DIAGNOSIS — Z86010 Personal history of colonic polyps: Secondary | ICD-10-CM | POA: Diagnosis not present

## 2017-09-08 DIAGNOSIS — I161 Hypertensive emergency: Secondary | ICD-10-CM | POA: Diagnosis present

## 2017-09-08 DIAGNOSIS — Z951 Presence of aortocoronary bypass graft: Secondary | ICD-10-CM

## 2017-09-08 DIAGNOSIS — R404 Transient alteration of awareness: Secondary | ICD-10-CM | POA: Diagnosis not present

## 2017-09-08 DIAGNOSIS — Z8249 Family history of ischemic heart disease and other diseases of the circulatory system: Secondary | ICD-10-CM | POA: Diagnosis not present

## 2017-09-08 DIAGNOSIS — I10 Essential (primary) hypertension: Secondary | ICD-10-CM

## 2017-09-08 DIAGNOSIS — E669 Obesity, unspecified: Secondary | ICD-10-CM | POA: Diagnosis present

## 2017-09-08 DIAGNOSIS — G40219 Localization-related (focal) (partial) symptomatic epilepsy and epileptic syndromes with complex partial seizures, intractable, without status epilepticus: Secondary | ICD-10-CM | POA: Diagnosis present

## 2017-09-08 DIAGNOSIS — N183 Chronic kidney disease, stage 3 (moderate): Secondary | ICD-10-CM | POA: Diagnosis not present

## 2017-09-08 DIAGNOSIS — Z86718 Personal history of other venous thrombosis and embolism: Secondary | ICD-10-CM

## 2017-09-08 DIAGNOSIS — R531 Weakness: Secondary | ICD-10-CM | POA: Diagnosis not present

## 2017-09-08 DIAGNOSIS — I517 Cardiomegaly: Secondary | ICD-10-CM | POA: Diagnosis not present

## 2017-09-08 DIAGNOSIS — I255 Ischemic cardiomyopathy: Secondary | ICD-10-CM | POA: Diagnosis present

## 2017-09-08 LAB — BASIC METABOLIC PANEL
Anion gap: 8 (ref 5–15)
BUN: 22 mg/dL — AB (ref 6–20)
CHLORIDE: 98 mmol/L — AB (ref 101–111)
CO2: 25 mmol/L (ref 22–32)
CREATININE: 1.37 mg/dL — AB (ref 0.61–1.24)
Calcium: 9.2 mg/dL (ref 8.9–10.3)
GFR calc Af Amer: 60 mL/min — ABNORMAL LOW (ref >60)
GFR calc non Af Amer: 51 mL/min — ABNORMAL LOW (ref >60)
GLUCOSE: 79 mg/dL (ref 65–99)
Potassium: 4.8 mmol/L (ref 3.5–5.1)
SODIUM: 131 mmol/L — AB (ref 135–145)

## 2017-09-08 LAB — CBC
HCT: 52.8 % — ABNORMAL HIGH (ref 39.0–52.0)
Hemoglobin: 17.3 g/dL — ABNORMAL HIGH (ref 13.0–17.0)
MCH: 28.9 pg (ref 26.0–34.0)
MCHC: 32.8 g/dL (ref 30.0–36.0)
MCV: 88.3 fL (ref 78.0–100.0)
PLATELETS: 107 10*3/uL — AB (ref 150–400)
RBC: 5.98 MIL/uL — ABNORMAL HIGH (ref 4.22–5.81)
RDW: 15.9 % — AB (ref 11.5–15.5)
WBC: 3.4 10*3/uL — AB (ref 4.0–10.5)

## 2017-09-08 LAB — URINALYSIS, ROUTINE W REFLEX MICROSCOPIC
BACTERIA UA: NONE SEEN
Bilirubin Urine: NEGATIVE
Glucose, UA: NEGATIVE mg/dL
Hgb urine dipstick: NEGATIVE
Ketones, ur: NEGATIVE mg/dL
Leukocytes, UA: NEGATIVE
Nitrite: NEGATIVE
PH: 5 (ref 5.0–8.0)
Protein, ur: 30 mg/dL — AB
SPECIFIC GRAVITY, URINE: 1.014 (ref 1.005–1.030)

## 2017-09-08 LAB — MAGNESIUM: MAGNESIUM: 1.8 mg/dL (ref 1.7–2.4)

## 2017-09-08 LAB — PHENYTOIN LEVEL, TOTAL: PHENYTOIN LVL: 10 ug/mL (ref 10.0–20.0)

## 2017-09-08 LAB — PHOSPHORUS: Phosphorus: 3.3 mg/dL (ref 2.5–4.6)

## 2017-09-08 LAB — I-STAT TROPONIN, ED: Troponin i, poc: 0.02 ng/mL (ref 0.00–0.08)

## 2017-09-08 LAB — CBG MONITORING, ED: GLUCOSE-CAPILLARY: 82 mg/dL (ref 65–99)

## 2017-09-08 LAB — BRAIN NATRIURETIC PEPTIDE: B Natriuretic Peptide: 108.7 pg/mL — ABNORMAL HIGH (ref 0.0–100.0)

## 2017-09-08 MED ORDER — SODIUM CHLORIDE 0.9 % IV BOLUS (SEPSIS)
500.0000 mL | Freq: Once | INTRAVENOUS | Status: AC
  Administered 2017-09-08: 500 mL via INTRAVENOUS

## 2017-09-08 MED ORDER — LORAZEPAM 2 MG/ML IJ SOLN
INTRAMUSCULAR | Status: AC
  Filled 2017-09-08: qty 1

## 2017-09-08 MED ORDER — LABETALOL HCL 5 MG/ML IV SOLN
10.0000 mg | Freq: Once | INTRAVENOUS | Status: AC
  Administered 2017-09-08: 10 mg via INTRAVENOUS

## 2017-09-08 MED ORDER — CARVEDILOL 3.125 MG PO TABS
3.1250 mg | ORAL_TABLET | Freq: Two times a day (BID) | ORAL | Status: DC
  Filled 2017-09-08: qty 1

## 2017-09-08 MED ORDER — LORAZEPAM 2 MG/ML IJ SOLN
2.0000 mg | Freq: Once | INTRAMUSCULAR | Status: AC
  Administered 2017-09-08: 2 mg via INTRAVENOUS

## 2017-09-08 MED ORDER — SODIUM CHLORIDE 0.9 % IV SOLN
1000.0000 mg | Freq: Once | INTRAVENOUS | Status: AC
  Administered 2017-09-08: 1000 mg via INTRAVENOUS
  Filled 2017-09-08: qty 10

## 2017-09-08 MED ORDER — PHENYTOIN SODIUM 50 MG/ML IJ SOLN
100.0000 mg | Freq: Three times a day (TID) | INTRAMUSCULAR | Status: DC
  Administered 2017-09-08 – 2017-09-10 (×5): 100 mg via INTRAVENOUS
  Filled 2017-09-08 (×7): qty 2

## 2017-09-08 MED ORDER — LABETALOL HCL 5 MG/ML IV SOLN
10.0000 mg | Freq: Once | INTRAVENOUS | Status: AC
  Administered 2017-09-08: 10 mg via INTRAVENOUS
  Filled 2017-09-08: qty 4

## 2017-09-08 NOTE — ED Notes (Signed)
RN came in room to start med; family was yelling at patient; Pt was alert but not responding to commands and sitting up humming; Neurologist and EDP notified and at bedside; New orders placed

## 2017-09-08 NOTE — ED Notes (Signed)
Patient transported to CT 

## 2017-09-08 NOTE — H&P (Addendum)
[facility]                                                                                    Patient Demographics  Louis Ford, is a 71 y.o. male  CSN: [id number]  MRN: [id number]  DOB - 1946-01-01  Admit Date - 09/08/2017  Outpatient Primary MD for the patient is [name], MD   With History of -  Past Medical History:  Diagnosis Date  . AICD (automatic cardioverter/defibrillator) present   . Atrial fibrillation   09/22/2012  . Blood loss anemia 04/18/2017   After GI bleed from colonoscopy and polypectomy  . CAD (coronary artery disease)   . Chronic systolic heart failure ([place])   . 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 GIB (gastrointestinal bleeding) 02/20/2017  . History of MRSA infection 05/2011  . HTN (hypertension)   . Hx of adenomatous colonic polyps 02/20/2017   01/2017 - 3 cm sigmoid TV adenoma and other smaller polyps - had post-polypectomy bleed Tx w/ clips Consider repeat colonoscopy 3 yrs [name], MD, [name]   . Hyperlipidemia   . implantable cardiac defibrillator-Biotronik    Device Implanted 2006; s/p gen change 03/2011 : bleeding persistent with pocket erosion and infection; explant and reimplant  06/2011  . Ischemic cardiomyopathy    EF 15 to 20% by TTE and TEE in 09/2012.  Severe LV dysfunction  . Obesity    BMI 31 in 09/2012  . Seizure disorder ([place]) latest 09/30/2012  . Seizures ([place])   . Stroke [facility])       Past Surgical History:  Procedure Laterality Date  . CARDIAC CATHETERIZATION N/A 10/12/2015   Procedure: Right Heart Cath;  Surgeon: [name], MD;  Location: [place] [facility];  Service: Cardiovascular;  Laterality: N/A;  . CARDIOVERSION  09/24/2012   Procedure: CARDIOVERSION;  Surgeon: [name], MD;  Location: [place];  Service: Cardiovascular;  Laterality: N/A;  . COLONOSCOPY W/ POLYPECTOMY  02/13/2017  . CORONARY ARTERY BYPASS GRAFT    . FLEXIBLE  SIGMOIDOSCOPY N/A 02/20/2017   Procedure: FLEXIBLE SIGMOIDOSCOPY;  Surgeon: [name], MD;  Location: [facility] ENDOSCOPY;  Service: Endoscopy;  Laterality: N/A;  . FLEXIBLE SIGMOIDOSCOPY N/A 02/21/2017   Procedure: FLEXIBLE SIGMOIDOSCOPY;  Surgeon: [name], MD;  Location: [facility] ENDOSCOPY;  Service: Endoscopy;  Laterality: N/A;  . ICD    . TEE WITHOUT CARDIOVERSION  09/24/2012   Procedure: TRANSESOPHAGEAL ECHOCARDIOGRAM (TEE);  Surgeon: [name], MD;  Location: [facility] ENDOSCOPY;  Service: Cardiovascular;  Laterality: N/A;  dave/anesth, dl, cindy/echo     in for   Chief Complaint  Patient presents with  . Weakness     HPI  Login Muckleroy  is a 71 y.o. male, With conjugate the past medical history significant for seizure disorder, hypertension, coronary artery disease and chronic systolic heart failure, factor VII deficiency and history of GI bleed presenting with episodes of staring and hummingFor around 2 minutes that were noted by his wife who brought him to be evaluated. The patient has had 3 episodes today, one of them in the emergency room  and witnessed by neurology and the ER physician. The patient was started on Keppra IV by neurology and received Ativan as well. His Dilantin level was 10 and he was taking his medications as ordered. His blood pressure was noted to be high and his sodium was noted to be at 131. When I examined the patient he was status post Ativan and profoundly sleeping. Wife denies history of alcoholism or drug abuse   Review of Systems    Unable to obtain  Social History Social History  Substance Use Topics  . Smoking status: Former Smoker    Types: Pipe    Quit date: 09/21/2008  . Smokeless tobacco: Former Systems developer    Quit date: 09/21/2008  . Alcohol use No     Family History Family History  Problem Relation Age of Onset  . Arthritis Mother   . Heart disease Mother   . Heart attack Mother   . Other Father        smoker  . Hypertension Neg Hx         unknown  . Stroke Neg Hx        unknown     Prior to Admission medications   Medication Sig Start Date End Date Taking? Authorizing Provider  amiodarone (PACERONE) 200 MG tablet Take 1 tablet (200 mg total) by mouth daily. 08/24/16  Yes [name], MD  BIDIL 20-37.5 MG tablet TAKE TWO TABLETS BY MOUTH THREE TIMES DAILY 07/03/17  Yes [name], [name], MD  carvedilol (COREG) 3.125 MG tablet Take 1 tablet (3.125 mg total) by mouth 2 (two) times daily. 02/04/17  Yes [name], [name], MD  DULoxetine (CYMBALTA) 30 MG capsule Take 1 capsule (30 mg total) by mouth daily. 09/02/17  Yes Nche, Charlene Brooke, NP  ferrous sulfate 325 (65 FE) MG tablet Take 1 tablet (325 mg total) by mouth 2 (two) times daily with a meal. 04/19/17  Yes [name], MD  furosemide (LASIX) 40 MG tablet TAKE ONE TABLET BY MOUTH ONCE DAILY Patient taking differently: TAKE 40mg  BY MOUTH ONCE DAILY 02/27/17  Yes [name], [name], MD  losartan (COZAAR) 25 MG tablet TAKE ONE TABLET BY MOUTH ONCE DAILY Patient taking differently: TAKE 25mg  BY MOUTH ONCE DAILY 03/27/17  Yes [name], [name], MD  nystatin (MYCOSTATIN) 100000 UNIT/ML suspension SWISH, GARGLE AND SPIT OUT 5-10 MILLILITERS EVERY 6 HOURS AS NEEDED 08/02/17  Yes [name], MD  phenytoin (DILANTIN) 100 MG ER capsule TAKE ONE CAPSULE BY MOUTH TWICE DAILY Patient taking differently: TAKE 100mg  BY MOUTH TWICE DAILY 09/02/17  Yes [name], MD  SAVAYSA 60 MG TABS tablet TAKE 1 TABLET BY MOUTH ONCE DAILY Patient taking differently: TAKE 60mg  BY MOUTH ONCE DAILY 08/30/17  Yes [name], [name], MD  simvastatin (ZOCOR) 20 MG tablet Take 1 tablet (20 mg total) by mouth daily at 6 PM. Overdue for annual appt w/labs must make appt for future refills 07/16/17  Yes Biagio Borg, MD  spironolactone (ALDACTONE) 25 MG tablet TAKE ONE-HALF TABLET BY MOUTH ONCE DAILY Patient taking differently: TAKE 12.5mg  TABLET BY MOUTH ONCE DAILY 03/27/17  Yes  Shirley Friar, PA-C  acetaminophen (TYLENOL) 325 MG tablet Take 650 mg by mouth every 6 (six) hours as needed for mild pain.    [provider]  diclofenac sodium (VOLTAREN) 1 % GEL Apply 4 g topically 4 (four) times daily as needed. Patient taking differently: Apply 4 g topically 4 (four) times daily as needed (  pain).  07/16/17   Biagio Borg, MD  polyethylene glycol [facility] / Floria Raveling) packet Take 17 g by mouth daily as needed. Patient taking differently: Take 17 g by mouth daily as needed for mild constipation.  02/25/17   Hosie Poisson, MD  senna-docusate (SENOKOT-S) 8.6-50 MG tablet Take 2 tablets by mouth 2 (two) times daily. Patient not taking: Reported on 09/08/2017 02/25/17   Hosie Poisson, MD  triamcinolone cream (KENALOG) 0.1 % Apply 1 application topically 2 (two) times daily. Patient taking differently: Apply 1 application topically 2 (two) times daily as needed (rash).  07/16/17 07/16/18  Biagio Borg, MD    No Known Allergies  Physical Exam  Vitals  Blood pressure (!) 146/103, pulse 67, temperature 98.7 F (37.1 C), resp. rate 17, height 5\' 11"  (1.803 m), weight 96.6 kg (213 lb), SpO2 100 %.   1. General A well-nourished, well-developed male soundly sleeping  2. Unable to check mentation.  3. Unable to check neurological activity.  4. Ears and Eyes appear Normal, Conjunctivae clear, PERRLA. Moist Oral Mucosa.  5. Supple Neck, No JVD, No cervical lymphadenopathy appriciated, No Carotid Bruits.  6. Symmetrical Chest wall movement, Good air movement bilaterally, CTAB.  7. RRR, No Gallops, Rubs or Murmurs, No Parasternal Heave.  8. Positive Bowel Sounds, Abdomen Soft, Non tender, No organomegaly appriciated,No rebound -guarding or rigidity.  9.  No Cyanosis, Normal Skin Turgor, No Skin Rash or Bruise.   Data Review  CBC  Recent Labs Lab 09/08/17 1741  WBC 3.4*  HGB 17.3*  HCT 52.8*  PLT 107*  MCV 88.3  MCH 28.9  MCHC 32.8  RDW 15.9*    ------------------------------------------------------------------------------------------------------------------  Chemistries   Recent Labs Lab 09/08/17 1741 09/08/17 2105  NA 131*  --   K 4.8  --   CL 98*  --   CO2 25  --   GLUCOSE 79  --   BUN 22*  --   CREATININE 1.37*  --   CALCIUM 9.2  --   MG  --  1.8   ------------------------------------------------------------------------------------------------------------------ estimated creatinine clearance is 61.2 mL/min (A) (by C-G formula based on SCr of 1.37 mg/dL (H)). ------------------------------------------------------------------------------------------------------------------ No results for input(s): TSH, T4TOTAL, T3FREE, THYROIDAB in the last 72 hours.  Invalid input(s): FREET3   Coagulation profile No results for input(s): INR, PROTIME in the last 168 hours. ------------------------------------------------------------------------------------------------------------------- No results for input(s): DDIMER in the last 72 hours. -------------------------------------------------------------------------------------------------------------------  Cardiac Enzymes No results for input(s): CKMB, TROPONINI, MYOGLOBIN in the last 168 hours.  Invalid input(s): CK ------------------------------------------------------------------------------------------------------------------ Invalid input(s): POCBNP   ---------------------------------------------------------------------------------------------------------------  Urinalysis    Component Value Date/Time   COLORURINE YELLOW 09/08/2017 [place] 09/08/2017 1743   LABSPEC 1.014 09/08/2017 1743   PHURINE 5.0 09/08/2017 1743   GLUCOSEU NEGATIVE 09/08/2017 1743   HGBUR NEGATIVE 09/08/2017 1743   BILIRUBINUR NEGATIVE 09/08/2017 1743   KETONESUR NEGATIVE 09/08/2017 1743   PROTEINUR 30 (A) 09/08/2017 1743   UROBILINOGEN 1.0 10/13/2013 1855   NITRITE  NEGATIVE 09/08/2017 1743   LEUKOCYTESUR NEGATIVE 09/08/2017 1743    ----------------------------------------------------------------------------------------------------------------   Imaging results:   Ct Head Wo Contrast  Result Date: 09/08/2017 CLINICAL DATA:  Episode of diaphoresis and weakness today. No loss of consciousness. History of seizures. EXAM: CT HEAD WITHOUT CONTRAST TECHNIQUE: Contiguous axial images were obtained from the base of the skull through the vertex without intravenous contrast. COMPARISON:  02/27/2016 CT. FINDINGS: Brain: Minimal chronic small vessel ischemic disease of periventricular white matter. No hydrocephalus.  No intracranial hemorrhage, midline shift or edema. No large vascular territory infarct. Vascular: Moderate atherosclerosis of the carotid siphons. No hyperdense vessels. Skull: No fracture or suspicious osseous lesions. Sinuses/Orbits: Mild ethmoid sinus mucosal thickening. No acute sinusitis. Intact orbits and globes bilaterally. Other: None IMPRESSION: Chronic stable small vessel ischemic disease of periventricular white matter. No acute intracranial appearing abnormality. Electronically Signed   By: Ashley Royalty M.D.   On: 09/08/2017 21:18   Dg Chest Port 1 View  Result Date: 09/08/2017 CLINICAL DATA:  Weakness EXAM: PORTABLE CHEST 1 VIEW COMPARISON:  10/25/2016 FINDINGS: Left chest wall ICD is identified with lead in the left ventricle. Cardiac enlargement. Status post median sternotomy and CABG. No pleural effusion or edema. No airspace opacities. IMPRESSION: 1. Cardiac enlargement. 2. No acute cardiopulmonary abnormalities noted. Electronically Signed   By: Kerby Moors M.D.   On: 09/08/2017 18:52    My personal review of EKG: Rhythm NSR, 70 bpm with incomplete left bundle branch block, PA-C left ventricular hypertrophy and old inferior MI    Assessment & Plan  1. Breakthrough seizures 2. Hypertension uncontrolled. Improving in the emergency  room 3. A. fib, controlled rate 4. Factor VII deficiency 5. Hyponatremia, started on normal saline IV  Plan  Admit to telemetry Discussed with ER provider. Keppra loading dose IV then by mouth Ativan when necessary Normal saline IV Follow BMP in a.m. EEG Neurology aware  DVT Prophylaxis heparin  AM Labs Ordered, also please review Full Orders  Code Status DO NOT RESUSCITATE  Disposition Plan: Home  Time spent in minutes : 46 minutes  Condition GUARDED  Case discussed with wife who confirmed that he is DO NOT RESUSCITATE and she is the power of attorney  @SIGNATURE @

## 2017-09-08 NOTE — ED Notes (Signed)
Admitting Provider at bedside. 

## 2017-09-08 NOTE — ED Provider Notes (Signed)
[facility] DEPT Provider Note   CSN: [id number] Arrival date & time: 09/08/17  1717     History   Chief Complaint Chief Complaint  Patient presents with  . Weakness    HPI [facility]. is a 71 y.o. male past medical history significant for atrial fibrillation, coronary artery disease, chronic systolic heart failure, ischemic cardiomyopathy, seizures, stroke, factor VII deficiency, hypertension, hyperlipidemia, GI bleeds, tobacco abuse, with AICD in place who presents to the ED today for diaphoresis spells x 3 days. The patient notes over the last 3 days he has had 3 episodes of diaphoretic spells where he will suddenly beginning sweating for no reason. Most recently, this evening, the patient states that this evening he was sitting at his kitchen table with his wife when he noticed diaphoresis of his forehead. States this is unusual for him, and caused him concern as he has never had this happen before. The triage note states that the patient earlier said that he was weak at the time of the event but he denies that at this time. He notes that he was lightheaded upon standing but denies dizziness or vertigo. He states he walked to his bedroom where he laid down and this relief his symptoms. The patient denies fever, headache, focal weakness, chest pain, shortness of breath, palpitations, melena, unilateral neck pain, unilateral weakness, facial asymmetry, difficulty with speech, aphasia, facial droop, changes in gait or difficulty with gait, nausea/vomiting, melena or trauma. Recently started on Duloxetine on the 10th. Patient is taking all seizure medication and has not missed a dose. Last seizure 2013. Echo 4/18 50-55% with mild hypokinesis of inferolateral myocardium. Last heart cath 2016. Not on anticoagulation medication.   HPI  Past Medical History:  Diagnosis Date  . AICD (automatic cardioverter/defibrillator) present   . Atrial fibrillation   09/22/2012  . Blood loss anemia  04/18/2017   After GI bleed from colonoscopy and polypectomy  . CAD (coronary artery disease)   . Chronic systolic heart failure ([place])   . Factor VII deficiency ([place]) 05/2011  . Factor VII deficiency ([place]) 10/07/2012  . GIB (gastrointestinal bleeding) 02/20/2017  . History of MRSA infection 05/2011  . HTN (hypertension)   . Hx of adenomatous colonic polyps 02/20/2017   01/2017 - 3 cm sigmoid TV adenoma and other smaller polyps - had post-polypectomy bleed Tx w/ clips Consider repeat colonoscopy 3 yrs [name], MD, [name]   . Hyperlipidemia   . implantable cardiac defibrillator-Biotronik    Device Implanted 2006; s/p gen change 03/2011 : bleeding persistent with pocket erosion and infection; explant and reimplant  06/2011  . Ischemic cardiomyopathy    EF 15 to 20% by TTE and TEE in 09/2012.  Severe LV dysfunction  . Obesity    BMI 31 in 09/2012  . Seizure disorder ([place]) latest 09/30/2012  . Seizures ([place])   . Stroke [facility])     Patient Active Problem List   Diagnosis Date Noted  . Mass of torso 09/02/2017  . Insomnia 09/02/2017  . Degenerative joint disease of knee, left 08/01/2017  . Blood loss anemia 04/18/2017  . Hx of adenomatous colonic polyps 02/20/2017  . Chronic pain of left knee 01/22/2017  . Thrush 12/15/2016  . Rash 12/13/2016  . Chronic kidney disease (CKD), stage III (moderate) 11/08/2016  . Hypertensive heart disease without heart failure   . Chronic chest wall pain 08/17/2016  . Periodontal disease 05/18/2016  . Dental infection 05/18/2016  . HLD (hyperlipidemia) 02/26/2016  .  CVA (cerebral infarction) 02/26/2016  . Near syncope 02/26/2016  . Injury of right thumb 12/30/2015  . Gout 12/30/2015  . Loss of consciousness ([place]) 01/05/2015  . Afib ([place]) 04/21/2014  . Encounter for therapeutic drug monitoring 01/19/2014  . Chronic systolic heart failure ([place]) 11/06/2012  . Factor VII deficiency ([place]) 10/07/2012  . Coagulopathy ([place]) 10/03/2012  . Seizure ([place])  10/01/2012  . Renal insufficiency 10/01/2012  . Ischemic cardiomyopathy  ? additional rate component 09/29/2012  . Automatic implantable cardioverter-defibrillator in situ   . HTN (hypertension) 09/21/2012  . Coronary artery disease involving native heart 09/21/2012    Past Surgical History:  Procedure Laterality Date  . CARDIAC CATHETERIZATION N/A 10/12/2015   Procedure: Right Heart Cath;  Surgeon: [name], MD;  Location: [place] CV LAB;  Service: Cardiovascular;  Laterality: N/A;  . CARDIOVERSION  09/24/2012   Procedure: CARDIOVERSION;  Surgeon: [name], MD;  Location: [facility];  Service: Cardiovascular;  Laterality: N/A;  . COLONOSCOPY W/ POLYPECTOMY  02/13/2017  . CORONARY ARTERY BYPASS GRAFT    . FLEXIBLE SIGMOIDOSCOPY N/A 02/20/2017   Procedure: FLEXIBLE SIGMOIDOSCOPY;  Surgeon: [name], MD;  Location: [facility] ENDOSCOPY;  Service: Endoscopy;  Laterality: N/A;  . FLEXIBLE SIGMOIDOSCOPY N/A 02/21/2017   Procedure: FLEXIBLE SIGMOIDOSCOPY;  Surgeon: [name], MD;  Location: [facility] ENDOSCOPY;  Service: Endoscopy;  Laterality: N/A;  . ICD    . TEE WITHOUT CARDIOVERSION  09/24/2012   Procedure: TRANSESOPHAGEAL ECHOCARDIOGRAM (TEE);  Surgeon: [name], MD;  Location: [facility] ENDOSCOPY;  Service: Cardiovascular;  Laterality: N/A;  dave/anesth, dl, cindy/echo        Home Medications    Prior to Admission medications   Medication Sig Start Date End Date Taking? Authorizing Provider  acetaminophen (TYLENOL) 325 MG tablet Take 650 mg by mouth every 6 (six) hours as needed for mild pain.    [provider]  amiodarone (PACERONE) 200 MG tablet Take 1 tablet (200 mg total) by mouth daily. 08/24/16   [name], MD  amiodarone (PACERONE) 200 MG tablet TAKE ONE TABLET BY MOUTH ONCE DAILY 08/30/17   [name], [name], MD  Ascorbic Acid (VITAMIN C) 100 MG tablet Take 100 mg by mouth daily.    [provider]  BIDIL 20-37.5 MG tablet TAKE TWO  TABLETS BY MOUTH THREE TIMES DAILY 07/03/17   [name], [name], MD  carvedilol (COREG) 3.125 MG tablet Take 1 tablet (3.125 mg total) by mouth 2 (two) times daily. 02/04/17   [name], [name], MD  diclofenac sodium (VOLTAREN) 1 % GEL Apply 4 g topically 4 (four) times daily as needed. 07/16/17   Biagio Borg, MD  DULoxetine (CYMBALTA) 30 MG capsule Take 1 capsule (30 mg total) by mouth daily. 09/02/17   Nche, Charlene Brooke, NP  ferrous sulfate 325 (65 FE) MG tablet Take 1 tablet (325 mg total) by mouth 2 (two) times daily with a meal. 04/19/17   [name], MD  furosemide (LASIX) 40 MG tablet TAKE ONE TABLET BY MOUTH ONCE DAILY 02/27/17   [name], [name], MD  losartan (COZAAR) 25 MG tablet TAKE ONE TABLET BY MOUTH ONCE DAILY 03/27/17   [name], [name], MD  nystatin (MYCOSTATIN) 100000 UNIT/ML suspension SWISH, GARGLE AND SPIT OUT 5-10 MILLILITERS EVERY 6 HOURS AS NEEDED 08/02/17   [name], MD  phenytoin (DILANTIN) 100 MG ER capsule TAKE ONE CAPSULE BY MOUTH TWICE DAILY 09/02/17   [name], MD  polyethylene  glycol (MIRALAX / GLYCOLAX) packet Take 17 g by mouth daily as needed. 02/25/17   Hosie Poisson, MD  SAVAYSA 60 MG TABS tablet TAKE 1 TABLET BY MOUTH ONCE DAILY 08/30/17   [name], [name], MD  senna-docusate (SENOKOT-S) 8.6-50 MG tablet Take 2 tablets by mouth 2 (two) times daily. 02/25/17   Hosie Poisson, MD  simvastatin (ZOCOR) 20 MG tablet Take 1 tablet (20 mg total) by mouth daily at 6 PM. Overdue for annual appt w/labs must make appt for future refills 07/16/17   Biagio Borg, MD  spironolactone (ALDACTONE) 25 MG tablet TAKE ONE-HALF TABLET BY MOUTH ONCE DAILY 03/27/17   Shirley Friar, PA-C  triamcinolone cream (KENALOG) 0.1 % Apply 1 application topically 2 (two) times daily. 07/16/17 07/16/18  Biagio Borg, MD    Family History Family History  Problem Relation Age of Onset  . Arthritis Mother   . Heart disease Mother   . Heart attack Mother   .  Other Father        smoker  . Hypertension Neg Hx        unknown  . Stroke Neg Hx        unknown    Social History Social History  Substance Use Topics  . Smoking status: Former Smoker    Types: Pipe    Quit date: 09/21/2008  . Smokeless tobacco: Former Systems developer    Quit date: 09/21/2008  . Alcohol use No     Allergies   Patient has no known allergies.   Review of Systems Review of Systems  All other systems reviewed and are negative.    Physical Exam Updated Vital Signs Pulse 79   Temp 98.7 F (37.1 C)   Resp 20   Ht 5\' 11"  (1.803 m)   Wt 96.6 kg (213 lb)   SpO2 97%   BMI 29.71 kg/m   Physical Exam  Constitutional: He appears well-developed and well-nourished.  HENT:  Head: Normocephalic and atraumatic.  Right Ear: External ear normal.  Left Ear: External ear normal.  Nose: Nose normal.  Mouth/Throat: Uvula is midline, oropharynx is clear and moist and mucous membranes are normal. No tonsillar exudate.  Eyes: Pupils are equal, round, and reactive to light. Right eye exhibits no discharge. Left eye exhibits no discharge. No scleral icterus.  Neck: Trachea normal. Neck supple. No spinous process tenderness present. No neck rigidity. Normal range of motion present.  Cardiovascular: Normal rate, regular rhythm and intact distal pulses.   No murmur heard. Pulses:      Radial pulses are 2+ on the right side, and 2+ on the left side.       Dorsalis pedis pulses are 2+ on the right side, and 2+ on the left side.       Posterior tibial pulses are 2+ on the right side, and 2+ on the left side.  No lower extremity swelling or edema. Calves symmetric in size bilaterally.  Pulmonary/Chest: Effort normal and breath sounds normal. He exhibits no tenderness.  Abdominal: Soft. Bowel sounds are normal. There is no tenderness. There is no rebound and no guarding.  Musculoskeletal: He exhibits no edema.  Lymphadenopathy:    He has no cervical adenopathy.  Neurological: He is  alert.  Speech clear. Follows commands. No facial droop. PERRLA. EOMI. Normal peripheral fields. Cranial Nerves:  II:  Pupils equal, round, reactive to light III,IV, VI: ptosis not present, extra-ocular motions intact bilaterally  V,VII: smile symmetric, facial light touch sensation equal  VIII: hearing grossly normal bilaterally  IX,X: midline uvula rise  XI: bilateral shoulder shrug equal and strong XII: midline tongue extension Grossly moves all extremities 4 without ataxia. Coordination intact. Able and appropriate strength for age to upper and lower extremities bilaterally including grip strength. Sensation to light touch intact bilaterally for upper and lower. Patellar deep tendon reflex 2+ and equal bilaterally. Normal finger to nose and rapid alternating movements. Normal heel to shin balance. Negative Romberg. No pronator drift. Able gait.   Skin: Skin is warm and dry. No rash noted. He is not diaphoretic.  Psychiatric: He has a normal mood and affect.  Nursing note and vitals reviewed.    ED Treatments / Results  Labs (all labs ordered are listed, but only abnormal results are displayed) Labs Reviewed - No data to display  EKG  EKG Interpretation  Date/Time:  Sunday September 08 2017 17:18:32 EDT Ventricular Rate:  73 PR Interval:    QRS Duration: 121 QT Interval:  456 QTC Calculation: 503 R Axis:   -59 Text Interpretation:  Sinus rhythm Left atrial enlargement Left bundle branch block When compared to prior, no signifiacnt changes seen,  No STEMI Confirmed by Antony Blackbird [phone]) on 09/08/2017 6:32:58 PM       Radiology No results found.  Procedures Procedures (including critical care time)  Medications Ordered in ED Medications - No data to display   Initial Impression / Assessment and Plan / ED Course  I have reviewed the triage vital signs and the nursing notes.  Pertinent labs & imaging results that were available during my care of the patient were  reviewed by me and considered in my medical decision making (see chart for details).     This is a 71 year old male who presents today for intermittent bouts of diaphoresis. Patient is without fever, tachycardia, hypotension, hypoxia or tachypnea. Neuro exam is unremarkable. Will workup for lightheadness vs pre-syncope vs atypical cardiac event. The patient is noted to have systolic murmur. Per chart review this has been documented in the past. EKG with LBBB. Scarbossa negative. No ischemic changes. Tn 0.02. CBG 82. UA without UTI. BMP with bump in Cr and BUN. CBC without leukocytosis. Na 131. BNP 108.7. CXR with cardiac enlargement but not acute changes.  Patient with is now present.  The wife notes after episode of lightheadedness earlier today, while the patient was lying down he displayed blank stare with open eyes towards the ceiling and would not respond to verbal commands or answer questions. Symptoms resolved after 10 minutes. The patient does not recall this. Sounds to be as thought he patient was in post-ictal state. While in the department, wife called for me after patient had another event. Event had ended when I arrived. Patient was noted to be diaphoretic on forehead. She reports he had an episode where he had a blank stare and would not respond. This lasted for 5-10 minutes. The patient does not recollect the event. Review of telemetry shows the patient was sinus tachycardia during the event. He is back to baseline now and during repeat EKG. Repeat neuro exam unremarkable. Question if this is the patient having multiple breakthrough seizures. Will check phenytoin level and consult neurology. Dr. Leonie Man advised to give patient 1g of Keppra, add Mg and Phosphorus. Advised neurology will consult and wait to see what the phenytoin level came back on to decide how to proceed.  CT head, phenytoin, Mg, Phosphorus pending. Case signed out to Charlann Lange, PA-C, with  dispo per neuro consult and pending  labs/imaging. Patient case seen and discussed with Dr. Sherry Ruffing who is in agreement with plan.    Final Clinical Impressions(s) / ED Diagnoses   Final diagnoses:  None    New Prescriptions New Prescriptions   No medications on file     Lorelle Gibbs 09/09/17 1122    Tegeler, Gwenyth Allegra, MD 09/09/17 [phone]

## 2017-09-08 NOTE — ED Notes (Signed)
Called lab and added on BNP 

## 2017-09-08 NOTE — ED Triage Notes (Signed)
Pt bib [facility] for weakness. Pt was in kitchen and had an episode of diaphoresis and weakness. Did not pass out or fall. Was confused after event per family. Pt has a history of seizures but remembers entire event. NAD noted. Pt does have a defib

## 2017-09-08 NOTE — ED Provider Notes (Signed)
Complicated medical history. H/o afib, defib in place, HTN, HLD,  Sxs, none since 2013   3 days of episodes of diaphoresis No other symptoms - better after rest Per wife had blank stare, sweating, patient without memory of events - lasts approximately 10 min Taking Dilantin - level pending - reports compliance with meds  Head CT pending Plan: Neuro consult Louis Ford)  CT negative for acute findings. Neurology in to see the patient and reports seizures are likely based on history. His Dilantin level is 10 (low on therapeutic range). He was loaded with Keppra. He was found to have a Na of 131, which per neuro, might cause seizures in an epileptic patient. Blood pressure also noted to be 173/124, with multiple similar readings throughout his ED encounter. No CP, SOB.   During the evaluation with neurology, the patient had another episode of blank stare, unresponsive witnessed by neurology and confirmed as seizure. He recommended the patient be admitted for observation, blood pressure control, and a.m. EEG.   Discussed with TRH, Dr. Ebony Ford, who accepts the patient for admission.    Louis Lange, PA-C 09/08/17 2256    Tegeler, Louis Allegra, MD 09/09/17 1258

## 2017-09-08 NOTE — Consult Note (Signed)
Neurology Consultation Reason for Consult: Seizures Referring Physician: Dr. Sherry Ruffing  CC: Seizures  History is obtained from: Wife and chart review  HPI: [facility]. is a 71 y.o. male AA RH with past medical history significant for atrial fibrillation, coronary artery disease, chronic systolic heart failure, ischemic cardiomyopathy with AICD, seizures, stroke, factor VII deficiency, hypertension, hyperlipidemia, GI bleeds, tobacco abuse presents to the emergency room after having a witnessed event at home concerning for seizure  Around 4 PM, the wife states the patient became diaphoretic and felt tired and went to bed. She went to check up on him after 15 minutes and noticed that he was making humming sound staring off into space. He did not respond to any verbal commands and was not answering questions. She felt this episode lasted for about 5 minutes until she went to call her daughter for help. But this time she went back to the room the patient was drowsy and confused. EMS arrived and brought the patient to [facility] ER. The patient  was fine and back to his baseline, however did not recall the event. While in the ER, the wife noticed another event similar to his first event lasting for about several minutes and returning to baseline after about 10 minutes.  Workup in the ER revealed the patient had hyponatremia with sodium 131. Phenytoin level returns and was barely therapeutic at 10 ( therapeutic level is 10-20). The wife states the patient is compliant on medications. There is been no new changes to his medication apart from recent addition of Cymbalta. His last seizure was in 2003 which was also partial seizure with shaking of the left arm. He was started on Keppra however it appears for unclear reasons ( on reviewing old records) he was switched to phenytoin shortly after.    ROS: A 14 point ROS was performed and is negative except as noted in the HPI.  Past Medical History:   Diagnosis Date  . AICD (automatic cardioverter/defibrillator) present   . Atrial fibrillation   09/22/2012  . Blood loss anemia 04/18/2017   After GI bleed from colonoscopy and polypectomy  . CAD (coronary artery disease)   . Chronic systolic heart failure ([place])   . Factor VII deficiency ([place]) 05/2011  . Factor VII deficiency ([place]) 10/07/2012  . GIB (gastrointestinal bleeding) 02/20/2017  . History of MRSA infection 05/2011  . HTN (hypertension)   . Hx of adenomatous colonic polyps 02/20/2017   01/2017 - 3 cm sigmoid TV adenoma and other smaller polyps - had post-polypectomy bleed Tx w/ clips Consider repeat colonoscopy 3 yrs [name], MD, [name]   . Hyperlipidemia   . implantable cardiac defibrillator-Biotronik    Device Implanted 2006; s/p gen change 03/2011 : bleeding persistent with pocket erosion and infection; explant and reimplant  06/2011  . Ischemic cardiomyopathy    EF 15 to 20% by TTE and TEE in 09/2012.  Severe LV dysfunction  . Obesity    BMI 31 in 09/2012  . Seizure disorder ([place]) latest 09/30/2012  . Seizures ([place])   . Stroke [facility])      Family History  Problem Relation Age of Onset  . Arthritis Mother   . Heart disease Mother   . Heart attack Mother   . Other Father        smoker  . Hypertension Neg Hx        unknown  . Stroke Neg Hx        unknown  Social History:  reports that he quit smoking about 8 years ago. His smoking use included Pipe. He quit smokeless tobacco use about 8 years ago. He reports that he does not drink alcohol or use drugs.   Medications: Reviewed medications, recently started on Cymbalta   Exam: Current vital signs: BP (!) 173/124   Pulse 75   Temp 98.7 F (37.1 C)   Resp (!) 21   Ht 5\' 11"  (1.803 m)   Wt 96.6 kg (213 lb)   SpO2 100%   BMI 29.71 kg/m  Vital signs in last 24 hours: Temp:  [98.7 F (37.1 C)] 98.7 F (37.1 C) (09/16 1722) Pulse Rate:  [70-94] 75 (09/16 2200) Resp:  [16-26] 21 (09/16 2230) BP:  (164-191)/(109-167) 173/124 (09/16 2230) SpO2:  [94 %-100 %] 100 % (09/16 2200) Weight:  [96.6 kg (213 lb)] 96.6 kg (213 lb) (09/16 1720)   Physical Exam  Constitutional: Appears well-developed and well-nourished.  Psych: Affect appropriate to situation Eyes: No scleral injection HENT: No OP obstrucion Head: Normocephalic.  Cardiovascular: Normal rate and regular rhythm.  Respiratory: Effort normal and breath sounds normal to anterior ascultation GI: Soft.  No distension. There is no tenderness.  Skin: WDI  Neuro: Mental Status: Patient is awake, alert, oriented to person, place, month, year, and situation. Patient is able to give a clear and coherent history. No signs of aphasia or neglect Cranial Nerves: II: Visual Fields are full. Pupils are equal, round, and reactive to light.   III,IV, VI: EOMI without ptosis or diploplia.  V: Facial sensation is symmetric to temperature VII: Facial movement is symmetric.  VIII: hearing is intact to voice X: Uvula elevates symmetrically XI: Shoulder shrug is symmetric. XII: tongue is midline without atrophy or fasciculations.  Motor: Tone is normal. Bulk is normal. 5/5 strength was present in all four extremities. Sensory: Sensation is symmetric to light touch and temperature in the arms and legs. Deep Tendon Reflexes: 2+ and symmetric in the biceps and patellae. Plantars: Toes are downgoing bilaterally.  Cerebellar: FNF and HKS are intact bilaterally  ASSESSMENT AND PLAN   71 year old man with prior CVA, A fib on Edoxaban, Seizure disorder on Dilantin  As well as ischemic cardiomyopathy, chronic systolic HF s/p ICD Biotronik implant in 2006, s/p generator change June 2012, CAD, HTN, dyslipidemia, prior CVA, A fib, Seizure disorder, and Factor VII deficiency prior tobacco use and alcohol consumption, quit in 2008 presents with event of staring spell and loss of awareness concerning for seizures. The patient has 2 more comlpex focal  seizures in the Emergency room.    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  Patient had 3 witnessed seizures without return to baseline between events. The second seizure which occurred in the in the emergency room resolve spontaneously and the patient returned to baseline in about 10 minutes. His third seizure in the emergency room was witnessed by me and lasted for 5 minutes, broke after receiving 2 x2  mg of Ativan. The patient still had not received Keppra at the time of third seizure.  Plan Please correct Hyponatremia 1 g IV Keppra load Resume home phenytoin at increased dose of 150 mg twice a day (phenytoin level 10 )   If  patient has  more seizures, please notify NEUROLOGY-  will obtain cEEG and please load  with additional gram of Keppra and start maintenance at 750mg  BID.  #Hypertensive Emergency  Goal BP is normotension Had not received home meds while waiting in ER Administered 20 mg of labetalol as patient post ictal and unable to swallow Further management per Medicine team    4pm Sweating Went to bed, 15 min humming, eye stare, unresponsive- 8min., mouth moving - came back to himself.  EMS-

## 2017-09-08 NOTE — ED Notes (Signed)
Neurologist at bedside. 

## 2017-09-08 NOTE — ED Notes (Signed)
ED Provider at bedside. 

## 2017-09-09 ENCOUNTER — Inpatient Hospital Stay (HOSPITAL_COMMUNITY)
Admit: 2017-09-09 | Discharge: 2017-09-09 | Disposition: A | Discharge status: Home / Self Care | Payer: Medicare Other | Attending: Neurology | Admitting: Neurology

## 2017-09-09 ENCOUNTER — Encounter (HOSPITAL_COMMUNITY): Payer: Self-pay

## 2017-09-09 DIAGNOSIS — R569 Unspecified convulsions: Secondary | ICD-10-CM

## 2017-09-09 LAB — BASIC METABOLIC PANEL
ANION GAP: 8 (ref 5–15)
BUN: 19 mg/dL (ref 6–20)
CO2: 23 mmol/L (ref 22–32)
CREATININE: 1.34 mg/dL — AB (ref 0.61–1.24)
Calcium: 8.5 mg/dL — ABNORMAL LOW (ref 8.9–10.3)
Chloride: 101 mmol/L (ref 101–111)
GFR calc non Af Amer: 53 mL/min — ABNORMAL LOW (ref >60)
Glucose, Bld: 75 mg/dL (ref 65–99)
POTASSIUM: 5.4 mmol/L — AB (ref 3.5–5.1)
Sodium: 132 mmol/L — ABNORMAL LOW (ref 135–145)

## 2017-09-09 LAB — MAGNESIUM: MAGNESIUM: 1.7 mg/dL (ref 1.7–2.4)

## 2017-09-09 MED ORDER — PHENYTOIN SODIUM EXTENDED 100 MG PO CAPS: 100.0000 mg | ORAL_CAPSULE | Freq: Three times a day (TID) | ORAL | Status: DC

## 2017-09-09 MED ORDER — ONDANSETRON HCL 4 MG PO TABS: 4.0000 mg | ORAL_TABLET | Freq: Four times a day (QID) | ORAL | Status: DC | PRN

## 2017-09-09 MED ORDER — VITAMIN C 500 MG PO TABS
250.0000 mg | ORAL_TABLET | Freq: Every day | ORAL | Status: DC
  Administered 2017-09-09 – 2017-09-10 (×2): 250 mg via ORAL
  Filled 2017-09-09 (×3): qty 1

## 2017-09-09 MED ORDER — SPIRONOLACTONE 25 MG PO TABS: 12.5000 mg | ORAL_TABLET | Freq: Every day | ORAL | Status: DC

## 2017-09-09 MED ORDER — SIMVASTATIN 20 MG PO TABS
20.0000 mg | ORAL_TABLET | Freq: Every day | ORAL | Status: DC
  Administered 2017-09-09: 20 mg via ORAL
  Filled 2017-09-09: qty 1

## 2017-09-09 MED ORDER — ACETAMINOPHEN 650 MG RE SUPP: 650.0000 mg | Freq: Four times a day (QID) | RECTAL | Status: DC | PRN

## 2017-09-09 MED ORDER — HEPARIN SODIUM (PORCINE) 5000 UNIT/ML IJ SOLN
5000.0000 [IU] | Freq: Three times a day (TID) | INTRAMUSCULAR | Status: DC
  Administered 2017-09-09: 5000 [IU] via SUBCUTANEOUS
  Filled 2017-09-09 (×2): qty 1

## 2017-09-09 MED ORDER — ISOSORB DINITRATE-HYDRALAZINE 20-37.5 MG PO TABS
2.0000 | ORAL_TABLET | Freq: Three times a day (TID) | ORAL | Status: DC
  Administered 2017-09-09 – 2017-09-10 (×4): 2 via ORAL
  Filled 2017-09-09 (×4): qty 2

## 2017-09-09 MED ORDER — SODIUM CHLORIDE 0.9 % IV SOLN
500.0000 mg | Freq: Once | INTRAVENOUS | Status: AC
  Administered 2017-09-09: 500 mg via INTRAVENOUS
  Filled 2017-09-09: qty 5

## 2017-09-09 MED ORDER — MAGNESIUM OXIDE 400 (241.3 MG) MG PO TABS
400.0000 mg | ORAL_TABLET | Freq: Two times a day (BID) | ORAL | Status: DC
  Administered 2017-09-09 – 2017-09-10 (×3): 400 mg via ORAL
  Filled 2017-09-09 (×3): qty 1

## 2017-09-09 MED ORDER — AMIODARONE HCL 200 MG PO TABS
200.0000 mg | ORAL_TABLET | Freq: Every day | ORAL | Status: DC
  Filled 2017-09-09 (×2): qty 1

## 2017-09-09 MED ORDER — LEVETIRACETAM 750 MG PO TABS
750.0000 mg | ORAL_TABLET | Freq: Two times a day (BID) | ORAL | Status: DC
  Administered 2017-09-09 – 2017-09-10 (×3): 750 mg via ORAL
  Filled 2017-09-09 (×2): qty 1

## 2017-09-09 MED ORDER — LEVETIRACETAM 500 MG PO TABS
500.0000 mg | ORAL_TABLET | Freq: Two times a day (BID) | ORAL | Status: DC
  Filled 2017-09-09: qty 1

## 2017-09-09 MED ORDER — AMIODARONE HCL 200 MG PO TABS
200.0000 mg | ORAL_TABLET | Freq: Every day | ORAL | Status: DC
  Administered 2017-09-09 – 2017-09-10 (×2): 200 mg via ORAL
  Filled 2017-09-09: qty 1

## 2017-09-09 MED ORDER — ONDANSETRON HCL 4 MG/2ML IJ SOLN: 4.0000 mg | Freq: Four times a day (QID) | INTRAMUSCULAR | Status: DC | PRN

## 2017-09-09 MED ORDER — FERROUS SULFATE 325 (65 FE) MG PO TABS
325.0000 mg | ORAL_TABLET | Freq: Two times a day (BID) | ORAL | Status: DC
  Administered 2017-09-09 – 2017-09-10 (×2): 325 mg via ORAL
  Filled 2017-09-09 (×2): qty 1

## 2017-09-09 MED ORDER — SPIRONOLACTONE 25 MG PO TABS
12.5000 mg | ORAL_TABLET | Freq: Every day | ORAL | Status: DC
  Administered 2017-09-10: 12.5 mg via ORAL
  Filled 2017-09-09: qty 1

## 2017-09-09 MED ORDER — HYDROCODONE-ACETAMINOPHEN 5-325 MG PO TABS: 1.0000 | ORAL_TABLET | ORAL | Status: DC | PRN

## 2017-09-09 MED ORDER — SENNOSIDES-DOCUSATE SODIUM 8.6-50 MG PO TABS: 2.0000 | ORAL_TABLET | Freq: Two times a day (BID) | ORAL | Status: DC

## 2017-09-09 MED ORDER — DULOXETINE HCL 30 MG PO CPEP
30.0000 mg | ORAL_CAPSULE | Freq: Every day | ORAL | Status: DC
  Administered 2017-09-09 – 2017-09-10 (×2): 30 mg via ORAL
  Filled 2017-09-09 (×2): qty 1

## 2017-09-09 MED ORDER — POLYETHYLENE GLYCOL 3350 17 G PO PACK: 17.0000 g | PACK | Freq: Every day | ORAL | Status: DC | PRN

## 2017-09-09 MED ORDER — ACETAMINOPHEN 325 MG PO TABS: 650.0000 mg | ORAL_TABLET | Freq: Four times a day (QID) | ORAL | Status: DC | PRN

## 2017-09-09 MED ORDER — EDOXABAN TOSYLATE 60 MG PO TABS
60.0000 mg | ORAL_TABLET | Freq: Every day | ORAL | Status: DC
  Administered 2017-09-09 – 2017-09-10 (×2): 60 mg via ORAL
  Filled 2017-09-09 (×2): qty 60

## 2017-09-09 MED ORDER — ACETAMINOPHEN 325 MG PO TABS
650.0000 mg | ORAL_TABLET | Freq: Four times a day (QID) | ORAL | Status: DC | PRN
  Administered 2017-09-09: 650 mg via ORAL
  Filled 2017-09-09: qty 2

## 2017-09-09 MED ORDER — SODIUM CHLORIDE 0.9 % IV SOLN
INTRAVENOUS | Status: DC
  Administered 2017-09-09: 08:00:00 via INTRAVENOUS
  Administered 2017-09-09: 50 mL/h via INTRAVENOUS

## 2017-09-09 MED ORDER — LORAZEPAM 2 MG/ML IJ SOLN: 2.0000 mg | INTRAMUSCULAR | Status: DC | PRN

## 2017-09-09 MED ORDER — FUROSEMIDE 40 MG PO TABS
40.0000 mg | ORAL_TABLET | Freq: Every day | ORAL | Status: DC
  Administered 2017-09-09 – 2017-09-10 (×2): 40 mg via ORAL
  Filled 2017-09-09 (×2): qty 1

## 2017-09-09 MED ORDER — LOSARTAN POTASSIUM 50 MG PO TABS
25.0000 mg | ORAL_TABLET | Freq: Every day | ORAL | Status: DC
  Administered 2017-09-09 – 2017-09-10 (×2): 25 mg via ORAL
  Filled 2017-09-09 (×2): qty 1

## 2017-09-09 NOTE — Procedures (Signed)
ELECTROENCEPHALOGRAM REPORT  Date of Study: 09/09/2017  Patient's Name: [facility]. MRN: [id number] Date of Birth: November 11, 1946  Referring Provider: Merton Border, MD  Clinical History: 71 y.o. male AA RH with past medical history significant for atrial fibrillation, coronary artery disease, chronic systolic heart failure, ischemic cardiomyopathy with AICD, seizures, stroke, factor VII deficiency, hypertension, hyperlipidemia, GI bleeds, tobacco abuse presents to the emergency room after having a witnessed event at home concerning for seizure  Medications: acetaminophen (TYLENOL)   amiodarone (PACERONE) DULoxetine (CYMBALTA)   furosemide (LASIX)   HYDROcodone-acetaminophen (NORCO/VICODIN)  isosorbide-hydrALAZINE (BIDIL) levETIRAcetam (KEPPRA)   LORazepam (ATIVAN)  losartan (COZAAR)  ondansetron (ZOFRAN)  phenytoin (DILANTIN)   simvastatin (ZOCOR) spironolactone (ALDACTONE)   Technical Summary: A multichannel digital EEG recording measured by the international 10-20 system with electrodes applied with paste and impedances below 5000 ohms performed as portable with EKG monitoring in an awake and drowsy patient.  Hyperventilation and photic stimulation were not performed.  The digital EEG was referentially recorded, reformatted, and digitally filtered in a variety of bipolar and referential montages for optimal display.  The patient received Ativan prior to the study.  Description: The patient is awake but drowsy during the recording.  During maximal wakefulness, there is a symmetric, medium voltage 9 Hz posterior dominant rhythm that attenuates with eye opening.  The record is symmetric with low amplitude 4-5 Hz theta and 2-3 Hz delta slowing of the background.  There is an excess amount of diffuse low voltage beta activity seen throughout the recording. Stage 2 sleep is not seen.  There were no epileptiform discharges or electrographic seizures seen.    EKG lead was  unremarkable.  Impression: This awake but drowsy EEG is normal except for excess amount of diffuse low voltage beta activity.  Clinical Correlation: Diffuse low voltage beta activity is commonly seen with sedating medications such as benzodiazepines.  In the absence of sedating medications, anxiety and hyperthyroidism may produce generalized beta activity.  The absence of epileptiform discharges does not exclude a clinical diagnosis of epilepsy.  If further clinical questions remain, prolonged EEG may be helpful.  Clinical correlation is advised.  Metta Clines, DO

## 2017-09-09 NOTE — Care Management Note (Signed)
Case Management Note  Patient Details  Name: [facility]. MRN: [id number] Date of Birth: 1946-01-26  Subjective/Objective:  Pt in with seizures. He is from home with his wife.                  Action/Plan: Plan is for patient to return home when medically ready. CM following for d/c needs, physician orders.   Expected Discharge Date:                  Expected Discharge Plan:  Home/Self Care  In-House Referral:     Discharge planning Services     Post Acute Care Choice:    Choice offered to:     DME Arranged:    DME Agency:     HH Arranged:    HH Agency:     Status of Service:  In process, will continue to follow  If discussed at Long Length of Stay Meetings, dates discussed:    Additional Comments:  Pollie Friar, RN 09/09/2017, 3:05 PM

## 2017-09-09 NOTE — Progress Notes (Signed)
PROGRESS NOTE    [facility].  IWL:[id number] DOB: [birth year] DOA: 09/08/2017 PCP: [name], MD   Specialists:  [facility]    Brief Narrative:   71 year old male CAD with CABG Ischemic cardiomyopathy with biotronic ICD 2013 tachybradycardia syndrome, [place] Basque 5 Chronic systolic heart failure last EF 30-35% 10/05/2015 improved to-->55-60 3/17 Hypertension Hyperlipidemia Factor VII deficiency with prior stroke Seizure disorder left knee pain acute lower GI bleed with polypectomy 02/13/2017 + diverticulosis left-sided mild thrombocytopenia in the past  admitted 9/16 with recurrent refractory seizures X3 in the emergency room -loaded with AeDEventually after having been given with multiple rounds of Ativan  EEG performed. Neurologist consulted with   etiology felt to be secondary to subtherapeutic phenytoin levels as well as hyponatremia on admission BUN/creatinine 22/1.3 LFTs normal, proBNP 108, troponin 0.02  Hyperkalemic this morning 5.4  Assessment & Plan:   Active Problems:   Seizure ([place])   Recurrent seizures  Not currently in status, loaded with Keppra 1000 twice a day, holding Ativan unless when necessary seizure  Phenytoin level subtherapeutic at10, free phenytoin level 0.6  patient claims compliance--Increased dilantin 100 ii-->iii/d   loaded in the emergency room with Rosalio Loud and will start Keppra 750twice a day todayas per neurologist  Recommendation  EEG shows slowing [was on ativan at time of study]  Ativan now when necessary only  CAD + CABG Ischemic cardiomyopathy status post ICD Medtronic TachyBrady syndrome, [place] score 5/anticoagulation  Discontinue IV saline 50 cc per hour, can continue Lasix 40 daily  Continue losartan 25, BiDil ii/3 times a day, amiodarone 200 daily, Savaysa 60 resumed  D/c sq heparin-resuming AC  ALdactone to resume 9/18--mildly hyperkalemic this am  Mag 1.7--replace orally 400 bid   Factor VII  deficiency with prior DVTs  Continue as above Savaysa as above 60 mg  Prior GI bleed 01/2017  Hemodynamically stable, follow-up with gastroenterology as outpatient   DVT prophylaxis: savaysa Code Status: full Family Communication: nad Disposition Plan: inpatient   Consultants:   neuro  Procedures:   none  Antimicrobials:   nad    Subjective: Alert and oriented arousable Eating drinking Nursing reports worried about giving by mouth meds buthas completely awake and now   Objective: Vitals:   09/09/17 0000 09/09/17 0032 09/09/17 0100 09/09/17 0500  BP: (!) 133/103 (!) 158/100 (!) 155/97 (!) 142/88  Pulse: 68 67 66 76  Resp: (!) 33 20 20 18   Temp:   98.8 F (37.1 C) 98.5 F (36.9 C)  TempSrc:   Axillary Axillary  SpO2: 100% 100% 100% 100%  Weight:  95.5 kg (210 lb 8.6 oz)    Height:  5\' 11"  (1.803 m)      Intake/Output Summary (Last 24 hours) at 09/09/17 0816 Last data filed at 09/09/17 1740  Gross per 24 hour  Intake           880.83 ml  Output              750 ml  Net           130.83 ml   Filed Weights   09/08/17 1720 09/09/17 0032  Weight: 96.6 kg (213 lb) 95.5 kg (210 lb 8.6 oz)    Examination:  Alert pleasant EOMI NCAT arousable but a little sleepy and drowsy from Ativan received overnight s1 s 2no m/r/g abd soft nt nd no rebound Neuro finger-nose-finger a little offhowever able to orient him correct Power 5/5 in both fingers probably Sensory not examined Reflexes  2/3 in knees and brachioradialis  Data Reviewed: I have personally reviewed following labs and imaging studies  CBC:  Recent Labs Lab 09/08/17 1741  WBC 3.4*  HGB 17.3*  HCT 52.8*  MCV 88.3  PLT 098*   Basic Metabolic Panel:  Recent Labs Lab 09/08/17 1741 09/08/17 2105 09/09/17 0455  NA 131*  --  132*  K 4.8  --  5.4*  CL 98*  --  101  CO2 25  --  23  GLUCOSE 79  --  75  BUN 22*  --  19  CREATININE 1.37*  --  1.34*  CALCIUM 9.2  --  8.5*  MG  --  1.8  --     PHOS  --  3.3  --    GFR: Estimated Creatinine Clearance: 62.2 mL/min (A) (by C-G formula based on SCr of 1.34 mg/dL (H)). Liver Function Tests: No results for input(s): AST, ALT, ALKPHOS, BILITOT, PROT, ALBUMIN in the last 168 hours. No results for input(s): LIPASE, AMYLASE in the last 168 hours. No results for input(s): AMMONIA in the last 168 hours. Coagulation Profile: No results for input(s): INR, PROTIME in the last 168 hours. Cardiac Enzymes: No results for input(s): CKTOTAL, CKMB, CKMBINDEX, TROPONINI in the last 168 hours. BNP (last 3 results) No results for input(s): PROBNP in the last 8760 hours. HbA1C: No results for input(s): HGBA1C in the last 72 hours. CBG:  Recent Labs Lab 09/08/17 1745  GLUCAP 82   Lipid Profile: No results for input(s): CHOL, HDL, LDLCALC, TRIG, CHOLHDL, LDLDIRECT in the last 72 hours. Thyroid Function Tests: No results for input(s): TSH, T4TOTAL, FREET4, T3FREE, THYROIDAB in the last 72 hours. Anemia Panel: No results for input(s): VITAMINB12, FOLATE, FERRITIN, TIBC, IRON, RETICCTPCT in the last 72 hours. Urine analysis:    Component Value Date/Time   COLORURINE YELLOW 09/08/2017 1743   APPEARANCEUR CLEAR 09/08/2017 1743   LABSPEC 1.014 09/08/2017 1743   PHURINE 5.0 09/08/2017 1743   GLUCOSEU NEGATIVE 09/08/2017 1743   HGBUR NEGATIVE 09/08/2017 1743   BILIRUBINUR NEGATIVE 09/08/2017 1743   KETONESUR NEGATIVE 09/08/2017 1743   PROTEINUR 30 (A) 09/08/2017 1743   UROBILINOGEN 1.0 10/13/2013 1855   NITRITE NEGATIVE 09/08/2017 1743   LEUKOCYTESUR NEGATIVE 09/08/2017 1743     Radiology Studies: Reviewed images personally in health database    Scheduled Meds: . amiodarone  200 mg Oral Daily  . amiodarone  200 mg Oral Daily  . DULoxetine  30 mg Oral Daily  . ferrous sulfate  325 mg Oral BID WC  . furosemide  40 mg Oral Daily  . heparin  5,000 Units Subcutaneous Q8H  . isosorbide-hydrALAZINE  2 tablet Oral TID  . levETIRAcetam   500 mg Oral BID  . LORazepam      . losartan  25 mg Oral Daily  . phenytoin (DILANTIN) IV  100 mg Intravenous TID  . simvastatin  20 mg Oral q1800  . spironolactone  12.5 mg Oral Daily  . vitamin C  250 mg Oral Daily   Continuous Infusions: . sodium chloride 50 mL/hr (09/09/17 0137)     LOS: 1 day    Time spent: [place] Grant, MD [facility]   If 7PM-7AM, please contact night-coverage [email or web address] Password [email or web address] 09/09/2017, 8:16 AM

## 2017-09-09 NOTE — Progress Notes (Signed)
EEG completed, results pending. 

## 2017-09-10 LAB — PHENYTOIN LEVEL, TOTAL: Phenytoin Lvl: 9.8 ug/mL — ABNORMAL LOW (ref 10.0–20.0)

## 2017-09-10 LAB — PATHOLOGIST SMEAR REVIEW

## 2017-09-10 MED ORDER — LEVETIRACETAM 750 MG PO TABS: 750.0000 mg | ORAL_TABLET | Freq: Two times a day (BID) | ORAL | 3 refills | Status: DC

## 2017-09-10 MED ORDER — PHENYTOIN SODIUM EXTENDED 300 MG PO CAPS: 300.0000 mg | ORAL_CAPSULE | Freq: Every day | ORAL | 2 refills | Status: DC

## 2017-09-10 NOTE — Care Management Note (Signed)
Case Management Note  Patient Details  Name: [facility]. MRN: [id number] Date of Birth: 03-04-1946  Subjective/Objective:                    Action/Plan: Pt discharging home with self care. Pt has PCP, insurance and transportation home. No further needs per CM.   Expected Discharge Date:  09/10/17               Expected Discharge Plan:  Home/Self Care  In-House Referral:     Discharge planning Services     Post Acute Care Choice:    Choice offered to:     DME Arranged:    DME Agency:     HH Arranged:    HH Agency:     Status of Service:  Completed, signed off  If discussed at [employer] of Stay Meetings, dates discussed:    Additional Comments:  Pollie Friar, RN 09/10/2017, 2:11 PM

## 2017-09-10 NOTE — Discharge Summary (Signed)
Physician Discharge Summary  [facility]. ZOX:[id number] DOB: 24-Aug-1946 DOA: 09/08/2017  PCP: [name], MD  Admit date: 09/08/2017 Discharge date: 09/10/2017  Time spent: 25 minutes  Recommendations for Outpatient Follow-up:  1. Needs bmet 1 week--Care with ue aldactone--mildly hyperkalemic this admit 2. Make sure following updated MAR for changes to AED's  Discharge Diagnoses:  Active Problems:   Seizure [facility])   Discharge Condition: improved  Diet recommendation: hh low salt  Filed Weights   09/08/17 1720 09/09/17 0032  Weight: 96.6 kg (213 lb) 95.5 kg (210 lb 8.6 oz)    History of present illness:  71 year old male CAD with CABG Ischemic cardiomyopathy with biotronic ICD 2013 tachybradycardia syndrome, [place] Basque 5 Chronic systolic heart failure last EF 30-35% 10/05/2015 improved to-->55-60 3/17 Hypertension Hyperlipidemia Factor VII deficiency with prior stroke Seizure disorder left knee pain acute lower GI bleed with polypectomy 02/13/2017 + diverticulosis left-sided mild thrombocytopenia in the past  admitted 9/16 with recurrent refractory seizures X3 in the emergency room -loaded with AeDEventually after having been given with multiple rounds of Ativan  EEG performed. Neurologist consulted with   etiology felt to be secondary to subtherapeutic phenytoin levels as well as hyponatremia on admission BUN/creatinine 22/1.3 LFTs normal, proBNP 108, troponin 0.02  Hyperkalemic this morning 5.4  Hospital Course:  Recurrent seizures             loaded with Keppra 1000 twice a day             Phenytoin level subtherapeutic at 1 0, free phenytoin level 0.6             patient claims compliance--Increased dilantin 100 ii-->iii/d ---changed to DIalntain 300 CR on d/c home  Keep Keppra 750twice a day today as per neurologist  Recommendation             EEG shows slowing [was on ativan at time of study]             Ativan now when necessary  only  CAD + CABG Ischemic cardiomyopathy status post ICD Medtronic TachyBrady syndrome, [place] score 5/anticoagulation             Discontinue IV saline 50 cc per hour, can continue Lasix 40 daily             Continue losartan 25, BiDil ii/3 times a day, amiodarone 200 daily, Savaysa 60 resumed             D/c sq heparin-resuming AC             ALdactone to resume 9/18--mildly hyperkalemic during this              Mag 1.7--replace orally 400 bid and recheck as OP  Factor VII deficiency with prior DVTs             Continue as above Savaysa as above 60 mg  Prior GI bleed 01/2017             Hemodynamically stable, follow-up with gastroenterology as outpatient  Procedures: eeg Ct head  Consultations:  neurology  Discharge Exam: Vitals:   09/10/17 0612 09/10/17 1008  BP: 108/83 112/80  Pulse: 70 61  Resp: 16 18  Temp: 98.2 F (36.8 C) 98.1 F (36.7 C)  SpO2: 100% 100%    General: eomi ncat Cardiovascular: s1 s 2no m/rg Respiratory: clear  Intact but sleeping  Discharge Instructions   Discharge Instructions    Ambulatory referral to Neurology    Complete  by:  As directed    An appointment is requested in approximately: 2 weeks for levels of dilantin and correction of AED if needed.   Call MD for:  redness, tenderness, or signs of infection (pain, swelling, redness, odor or green/yellow discharge around incision site)    Complete by:  As directed    Diet - low sodium heart healthy    Complete by:  As directed    Discharge instructions    Complete by:  As directed    Please get labwork in ~ 1 week at your regular MD office-We think you might have had seizures from low levels of your Dilantin in the blood and we are adding a new medicine called Keppra which is to be taken x 2 per day Please also check with your cardiologist regarding your medications for your heart   Increase activity slowly    Complete by:  As directed      Current Discharge Medication List     START taking these medications   Details  levETIRAcetam (KEPPRA) 750 MG tablet Take 1 tablet (750 mg total) by mouth 2 (two) times daily. Qty: 60 tablet, Refills: 3      CONTINUE these medications which have CHANGED   Details  phenytoin (DILANTIN) 300 MG ER capsule Take 1 capsule (300 mg total) by mouth at bedtime. Qty: 30 capsule, Refills: 2      CONTINUE these medications which have NOT CHANGED   Details  amiodarone (PACERONE) 200 MG tablet Take 1 tablet (200 mg total) by mouth daily. Qty: 30 tablet, Refills: 3    BIDIL 20-37.5 MG tablet TAKE TWO TABLETS BY MOUTH THREE TIMES DAILY Qty: 180 tablet, Refills: 6    carvedilol (COREG) 3.125 MG tablet Take 1 tablet (3.125 mg total) by mouth 2 (two) times daily. Qty: 180 tablet, Refills: 3    DULoxetine (CYMBALTA) 30 MG capsule Take 1 capsule (30 mg total) by mouth daily. Qty: 30 capsule, Refills: 0   Associated Diagnoses: Insomnia, unspecified type; Chronic chest wall pain    ferrous sulfate 325 (65 FE) MG tablet Take 1 tablet (325 mg total) by mouth 2 (two) times daily with a meal. Qty: 60 tablet, Refills: 3   Associated Diagnoses: Iron deficiency anemia, unspecified iron deficiency anemia type    furosemide (LASIX) 40 MG tablet TAKE ONE TABLET BY MOUTH ONCE DAILY Qty: 90 tablet, Refills: 3    losartan (COZAAR) 25 MG tablet TAKE ONE TABLET BY MOUTH ONCE DAILY Qty: 90 tablet, Refills: 3    nystatin (MYCOSTATIN) 100000 UNIT/ML suspension SWISH, GARGLE AND SPIT OUT 5-10 MILLILITERS EVERY 6 HOURS AS NEEDED Qty: 240 mL, Refills: 0    SAVAYSA 60 MG TABS tablet TAKE 1 TABLET BY MOUTH ONCE DAILY Qty: 30 tablet, Refills: 6    simvastatin (ZOCOR) 20 MG tablet Take 1 tablet (20 mg total) by mouth daily at 6 PM. Overdue for annual appt w/labs must make appt for future refills Qty: 90 tablet, Refills: 1    spironolactone (ALDACTONE) 25 MG tablet TAKE ONE-HALF TABLET BY MOUTH ONCE DAILY Qty: 45 tablet, Refills: 3   Associated  Diagnoses: CHF (congestive heart failure), NYHA class III, acute on chronic, systolic (HCC)    acetaminophen (TYLENOL) 325 MG tablet Take 650 mg by mouth every 6 (six) hours as needed for mild pain.    diclofenac sodium (VOLTAREN) 1 % GEL Apply 4 g topically 4 (four) times daily as needed. Qty: 100 g, Refills: 5    polyethylene  glycol (MIRALAX / GLYCOLAX) packet Take 17 g by mouth daily as needed. Qty: 14 each, Refills: 0    senna-docusate (SENOKOT-S) 8.6-50 MG tablet Take 2 tablets by mouth 2 (two) times daily. Qty: 10 tablet, Refills: 0    triamcinolone cream (KENALOG) 0.1 % Apply 1 application topically 2 (two) times daily. Qty: 30 g, Refills: 0      STOP taking these medications     Ascorbic Acid (VITAMIN C) 100 MG tablet        No Known Allergies    The results of significant diagnostics from this hospitalization (including imaging, microbiology, ancillary and laboratory) are listed below for reference.    Significant Diagnostic Studies: Ct Head Wo Contrast  Result Date: 09/08/2017 CLINICAL DATA:  Episode of diaphoresis and weakness today. No loss of consciousness. History of seizures. EXAM: CT HEAD WITHOUT CONTRAST TECHNIQUE: Contiguous axial images were obtained from the base of the skull through the vertex without intravenous contrast. COMPARISON:  02/27/2016 CT. FINDINGS: Brain: Minimal chronic small vessel ischemic disease of periventricular white matter. No hydrocephalus. No intracranial hemorrhage, midline shift or edema. No large vascular territory infarct. Vascular: Moderate atherosclerosis of the carotid siphons. No hyperdense vessels. Skull: No fracture or suspicious osseous lesions. Sinuses/Orbits: Mild ethmoid sinus mucosal thickening. No acute sinusitis. Intact orbits and globes bilaterally. Other: None IMPRESSION: Chronic stable small vessel ischemic disease of periventricular white matter. No acute intracranial appearing abnormality. Electronically Signed   By:  Ashley Royalty M.D.   On: 09/08/2017 21:18   Dg Chest Port 1 View  Result Date: 09/08/2017 CLINICAL DATA:  Weakness EXAM: PORTABLE CHEST 1 VIEW COMPARISON:  10/25/2016 FINDINGS: Left chest wall ICD is identified with lead in the left ventricle. Cardiac enlargement. Status post median sternotomy and CABG. No pleural effusion or edema. No airspace opacities. IMPRESSION: 1. Cardiac enlargement. 2. No acute cardiopulmonary abnormalities noted. Electronically Signed   By: Kerby Moors M.D.   On: 09/08/2017 18:52    Microbiology: No results found for this or any previous visit (from the past 240 hour(s)).   Labs: Basic Metabolic Panel:  Recent Labs Lab 09/08/17 1741 09/08/17 2105 09/09/17 0455 09/09/17 0843  NA 131*  --  132*  --   K 4.8  --  5.4*  --   CL 98*  --  101  --   CO2 25  --  23  --   GLUCOSE 79  --  75  --   BUN 22*  --  19  --   CREATININE 1.37*  --  1.34*  --   CALCIUM 9.2  --  8.5*  --   MG  --  1.8  --  1.7  PHOS  --  3.3  --   --    Liver Function Tests: No results for input(s): AST, ALT, ALKPHOS, BILITOT, PROT, ALBUMIN in the last 168 hours. No results for input(s): LIPASE, AMYLASE in the last 168 hours. No results for input(s): AMMONIA in the last 168 hours. CBC:  Recent Labs Lab 09/08/17 1741  WBC 3.4*  HGB 17.3*  HCT 52.8*  MCV 88.3  PLT 107*   Cardiac Enzymes: No results for input(s): CKTOTAL, CKMB, CKMBINDEX, TROPONINI in the last 168 hours. BNP: BNP (last 3 results)  Recent Labs  10/25/16 0914 11/08/16 1100 09/08/17 1741  BNP 56.7 170.0* 108.7*    ProBNP (last 3 results) No results for input(s): PROBNP in the last 8760 hours.  CBG:  Recent Labs Lab 09/08/17 1745  GLUCAP 82       SignedNita Sells MD   [facility] 09/10/2017, 1:29 PM

## 2017-09-10 NOTE — Progress Notes (Signed)
Subjective: Was sleeping and drowsyy due to waking him up. Interactive once awoken but still drowsy.   Exam: Vitals:   09/10/17 0612 09/10/17 1008  BP: 108/83 112/80  Pulse: 70 61  Resp: 16 18  Temp: 98.2 F (36.8 C) 98.1 F (36.7 C)  SpO2: 100% 100%    HEENT-  Normocephalic, no lesions, without obvious abnormality.  Normal external eye and conjunctiva.  Normal TM's bilaterally.  Normal auditory canals and external ears. Normal external nose, mucus membranes and septum.  Normal pharynx.    Neuro: drwsy due to waking him up. Able to follow commands.  CN: Pupils are equal and round. They are symmetrically reactive from 3-->2 mm. EOMI without nystagmus. Facial sensation is intact to light touch. Face is symmetric at rest with normal strength and mobility. Hearing is intact to conversational voice. Palate elevates symmetrically and uvula is midline. Voice is normal in tone, pitch and quality. Bilateral SCM and trapezii are 5/5. Tongue is midline with normal bulk and mobility.  Motor: Normal bulk, tone, and strength. 5/5 throughout. No drift.  Sensation: Intact to light touch.      Pertinent Labs/Diagnostics: Dilantin 9.8 but do not have albumin to correct this level.  EEG: Impression: This awake but drowsy EEG is normal except for excess amount of diffuse low voltage beta activity.  Etta Quill PA-C [facility] [phone]  Impression: Provoked seizure secondary to hyponatremia.    Recommendations: 1) continue AED regime of  Keppra 750 mg BID and Dilantin 300 mg Q HS ( for better compliance. ) 2) Get Dilantin level in 3 days with albumin 3) Follow up with neurology in 2 weeks.     09/10/2017, 1:08 PM

## 2017-09-11 ENCOUNTER — Encounter: Payer: Self-pay | Admitting: Neurology

## 2017-09-16 ENCOUNTER — Telehealth: Payer: Self-pay | Admitting: Nurse Practitioner

## 2017-09-16 NOTE — Telephone Encounter (Signed)
Received voice mail from the pt stating they need help with medications.   Left vm for the pt to call back, need to know more about medications they are talking about.

## 2017-09-17 NOTE — Telephone Encounter (Signed)
Left another vm for pt to call back.

## 2017-09-19 ENCOUNTER — Other Ambulatory Visit (INDEPENDENT_AMBULATORY_CARE_PROVIDER_SITE_OTHER): Payer: Medicare Other

## 2017-09-19 ENCOUNTER — Encounter: Payer: Self-pay | Admitting: Internal Medicine

## 2017-09-19 ENCOUNTER — Ambulatory Visit (INDEPENDENT_AMBULATORY_CARE_PROVIDER_SITE_OTHER): Payer: Medicare Other | Admitting: Internal Medicine

## 2017-09-19 ENCOUNTER — Other Ambulatory Visit: Payer: Self-pay | Admitting: Internal Medicine

## 2017-09-19 VITALS — BP 118/78 | HR 77 | Temp 98.1°F | Ht 71.0 in | Wt 207.0 lb

## 2017-09-19 DIAGNOSIS — G40909 Epilepsy, unspecified, not intractable, without status epilepticus: Secondary | ICD-10-CM

## 2017-09-19 DIAGNOSIS — R569 Unspecified convulsions: Secondary | ICD-10-CM

## 2017-09-19 DIAGNOSIS — G8929 Other chronic pain: Secondary | ICD-10-CM | POA: Diagnosis not present

## 2017-09-19 DIAGNOSIS — N289 Disorder of kidney and ureter, unspecified: Secondary | ICD-10-CM

## 2017-09-19 DIAGNOSIS — R0789 Other chest pain: Secondary | ICD-10-CM | POA: Diagnosis not present

## 2017-09-19 DIAGNOSIS — G4700 Insomnia, unspecified: Secondary | ICD-10-CM | POA: Diagnosis not present

## 2017-09-19 LAB — CBC
HCT: 52.4 % — ABNORMAL HIGH (ref 39.0–52.0)
HEMOGLOBIN: 16.8 g/dL (ref 13.0–17.0)
MCHC: 32.1 g/dL (ref 30.0–36.0)
MCV: 91.4 fl (ref 78.0–100.0)
Platelets: 158 10*3/uL (ref 150.0–400.0)
RBC: 5.73 Mil/uL (ref 4.22–5.81)
RDW: 16.4 % — AB (ref 11.5–15.5)
WBC: 2.9 10*3/uL — ABNORMAL LOW (ref 4.0–10.5)

## 2017-09-19 LAB — COMPREHENSIVE METABOLIC PANEL
ALK PHOS: 102 U/L (ref 39–117)
ALT: 19 U/L (ref 0–53)
AST: 24 U/L (ref 0–37)
Albumin: 4.2 g/dL (ref 3.5–5.2)
BUN: 23 mg/dL (ref 6–23)
CO2: 31 mEq/L (ref 19–32)
Calcium: 9.9 mg/dL (ref 8.4–10.5)
Chloride: 100 mEq/L (ref 96–112)
Creatinine, Ser: 1.4 mg/dL (ref 0.40–1.50)
GFR: 64.66 mL/min (ref >60.00)
GLUCOSE: 90 mg/dL (ref 70–99)
Potassium: 4.8 mEq/L (ref 3.5–5.1)
SODIUM: 137 meq/L (ref 135–145)
Total Bilirubin: 0.6 mg/dL (ref 0.2–1.2)
Total Protein: 8.2 g/dL (ref 6.0–8.3)

## 2017-09-19 MED ORDER — DULOXETINE HCL 30 MG PO CPEP: 30.0000 mg | ORAL_CAPSULE | Freq: Every day | ORAL | 11 refills | Status: DC

## 2017-09-19 MED ORDER — NYSTATIN 100000 UNIT/ML MT SUSP: OROMUCOSAL | 11 refills | Status: DC

## 2017-09-19 MED ORDER — LEVETIRACETAM 750 MG PO TABS: 750.0000 mg | ORAL_TABLET | Freq: Two times a day (BID) | ORAL | 3 refills | Status: DC

## 2017-09-19 NOTE — Patient Instructions (Signed)
We are checking the labs today and have sent in the refills.    

## 2017-09-20 ENCOUNTER — Encounter: Payer: Self-pay | Admitting: Internal Medicine

## 2017-09-20 LAB — PHENYTOIN LEVEL, TOTAL: Phenytoin, Total: 18.9 mg/L (ref 10.0–20.0)

## 2017-09-20 NOTE — Assessment & Plan Note (Signed)
Doing well with cymbalta and helping his mood as well so will continue.

## 2017-09-20 NOTE — Assessment & Plan Note (Signed)
He is taking increased dilantin dosing and keppra. Will follow up with neurology. Given timing it is unclear if cymbalta addition could have changed the concentration of dilantin. Recheck dilantin level today.

## 2017-09-20 NOTE — Assessment & Plan Note (Signed)
Checking CMP for stability given recent medication changes.

## 2017-09-20 NOTE — Progress Notes (Signed)
   Subjective:    Patient ID: Louis Ford., male    DOB: September 10, 1946, 71 y.o.   MRN: [id number]  HPI The patient is a 71 YO man coming in for hospital follow up (in for seizures with low dilantin level, started on keppra and neurology evaluation, follow up with them in several weeks). They deny recurrent seizures. Is taking adjusted dilantin and keppra doses. He denies side effects, headaches, chest pains. No falls since being home. Denies change in meds or missing doses before hospital stay. He was started on cymbalta prior to seizures and has kept on this. This is helping his mood and he wishes to continue. His wife especially agrees and feels it has helped his patience.   PMH, [facility], social history reviewed and updated.   Review of Systems  Constitutional: Negative.   HENT: Negative.   Eyes: Negative.   Respiratory: Negative for cough, chest tightness and shortness of breath.   Cardiovascular: Negative for chest pain, palpitations and leg swelling.  Gastrointestinal: Negative for abdominal distention, abdominal pain, constipation, diarrhea, nausea and vomiting.  Musculoskeletal: Negative.   Skin: Negative.   Neurological: Negative.   Psychiatric/Behavioral: Negative.       Objective:   Physical Exam  Constitutional: He is oriented to person, place, and time. He appears well-developed and well-nourished.  HENT:  Head: Normocephalic and atraumatic.  Sore lateral right tongue healing  Eyes: EOM are normal.  Neck: Normal range of motion.  Cardiovascular: Normal rate and regular rhythm.   Pulmonary/Chest: Effort normal and breath sounds normal. No respiratory distress. He has no wheezes. He has no rales.  Abdominal: Soft. Bowel sounds are normal. He exhibits no distension. There is no tenderness. There is no rebound.  Musculoskeletal: He exhibits no edema.  Neurological: He is alert and oriented to person, place, and time. Coordination normal.  Skin: Skin is warm and dry.    Psychiatric: He has a normal mood and affect.   Vitals:   09/19/17 1455  BP: 118/78  Pulse: 77  Temp: 98.1 F (36.7 C)  TempSrc: Oral  SpO2: 99%  Weight: 207 lb (93.9 kg)  Height: 5\' 11"  (1.803 m)      Assessment & Plan:

## 2017-09-24 ENCOUNTER — Ambulatory Visit
Admission: RE | Admit: 2017-09-24 | Discharge: 2017-09-24 | Disposition: A | Discharge status: Home / Self Care | Payer: Medicare Other | Source: Ambulatory Visit | Attending: Nurse Practitioner | Admitting: Nurse Practitioner

## 2017-09-24 ENCOUNTER — Other Ambulatory Visit: Payer: Self-pay | Admitting: Nurse Practitioner

## 2017-09-24 DIAGNOSIS — R222 Localized swelling, mass and lump, trunk: Secondary | ICD-10-CM | POA: Diagnosis not present

## 2017-09-24 DIAGNOSIS — R1032 Left lower quadrant pain: Secondary | ICD-10-CM | POA: Diagnosis not present

## 2017-09-30 ENCOUNTER — Other Ambulatory Visit: Payer: Self-pay | Admitting: Nurse Practitioner

## 2017-09-30 DIAGNOSIS — R0789 Other chest pain: Secondary | ICD-10-CM

## 2017-09-30 DIAGNOSIS — G8929 Other chronic pain: Secondary | ICD-10-CM

## 2017-09-30 DIAGNOSIS — G4700 Insomnia, unspecified: Secondary | ICD-10-CM

## 2017-10-07 ENCOUNTER — Encounter: Payer: Self-pay | Admitting: Neurology

## 2017-10-07 ENCOUNTER — Other Ambulatory Visit: Payer: Medicare Other

## 2017-10-07 ENCOUNTER — Ambulatory Visit (INDEPENDENT_AMBULATORY_CARE_PROVIDER_SITE_OTHER): Payer: Medicare Other | Admitting: Neurology

## 2017-10-07 VITALS — BP 118/76 | HR 88 | Ht 72.0 in | Wt 211.0 lb

## 2017-10-07 DIAGNOSIS — G40209 Localization-related (focal) (partial) symptomatic epilepsy and epileptic syndromes with complex partial seizures, not intractable, without status epilepticus: Secondary | ICD-10-CM

## 2017-10-07 MED ORDER — LEVETIRACETAM 1000 MG PO TABS: 1000.0000 mg | ORAL_TABLET | Freq: Two times a day (BID) | ORAL | 11 refills | Status: DC

## 2017-10-07 MED ORDER — PHENYTOIN SODIUM EXTENDED 300 MG PO CAPS: 300.0000 mg | ORAL_CAPSULE | Freq: Every day | ORAL | 11 refills | Status: DC

## 2017-10-07 NOTE — Progress Notes (Signed)
NEUROLOGY CONSULTATION NOTE  [facility]. MRN: [id number] DOB: 1946-05-06  Referring provider: Etta Quill, PA-C Primary care provider: Dr. Pricilla Holm  Reason for consult:  seizures  Thank you for your kind referral of [facility]. for consultation of the above symptoms. Although his history is well known to you, please allow me to reiterate it for the purpose of our medical record. The patient was accompanied to the clinic by his wife who also provides collateral information. Records and images were personally reviewed where available.  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 He moved to [place] 3 years ago and comes today with his wife. He reports seizures started in his early 71s while he was living in [place]. He also reports being told he had a stroke but denied any deficits from it. He has had several hospital/ER visits to [facility] since 2013, records were reviewed. He denies any prior warning to the seizures. He has been married to his wife for a year, she has only seen 2 seizures, when he was admitted last September 2018, and one a week ago at 1am. She describes humming, unresponsiveness, body stiff but no convulsive activity. Records from prior ER visits from 2013 to 2015 indicate staring spells and generalized tonic-clonic seizures. The last ER visit for seizure prior to September 2018 was in October 2016. He was previously on Keppra and Dilantin at some point in [place], and when he moved here and had the seizures initially, he was not taking seizure medication. Dilantin appears to have been prescribed from the ER at some point. He has been taking Dilantin 100mg  BID and denied missing medication when he was admitted for  breakthrough seizures last 09/08/17. His wife reports he had 2 seizures where he was humming and unresponsive for a few minutes. In the ER, he had 2 witnessed complex partial seizures with note of diaphoresis, humming, followed by staring off, lip smacking, and picking objects. This lasted 5 minutes, he received 2 doses of Ativan. His awake and drowsy EEG showed diffuse beta activity, no epileptiform discharges. His head CT did not show any acute changes, there was chronic stable microvascular disease. He had a head CT in October 2013 with note of encephalomalacia in the medial left temporal lobe, this is slightly visible on last brain imaging. His Dilantin level was 10, he was discharged home on higher dose Dilantin 300mg  qhs and Keppra 750mg  BID. He denies any side effects to the medications. His wife administers medications. She reports one more seizures since hospitalization that occurred a week ago at 1am while in bed, he became diaphoretic, said it was hot and turned the fan on, then started humming for a few minutes.   He denies any dizziness, diplopia, dysarthria/dysphagia, neck/back pain, bowel/bladder dysfunction. He has a little shakiness in both hands. He denies any gaps in time, olfactory/gustatory hallucinations, deja vu, rising epigastric sensation, focal numbness/tingling/weakness, myoclonic jerks. He reports waking up today feeling a little left-sided chest pain and warm feeling in his left arm. He reports a sensation in his head like there is water going from side to side, he has occasional left-sided head pain. He does not sleep well, he is up at night watching TV, goes to bed from 4am to 10am. He denies any  alcohol use. He feels his memory is good. His wife has taken over medications since his hospital stay, he would take his medication in the pillbox and want to fill it back up and take again, or sometimes he would not take all the medications in the box.   Epilepsy Risk Factors:  History of  stroke. There is medial left temporal encephalomalacia on head CT. Otherwise he had a normal birth and early development.  There is no history of febrile convulsions, CNS infections such as meningitis/encephalitis, significant traumatic brain injury, neurosurgical procedures, or family history of seizures.   PAST MEDICAL HISTORY: Past Medical History:  Diagnosis Date  . AICD (automatic cardioverter/defibrillator) present   . Atrial fibrillation   09/22/2012  . Blood loss anemia 04/18/2017   After GI bleed from colonoscopy and polypectomy  . CAD (coronary artery disease)   . Chronic systolic heart failure ([place])   . Factor VII deficiency ([place]) 05/2011  . Factor VII deficiency ([place]) 10/07/2012  . GIB (gastrointestinal bleeding) 02/20/2017  . History of MRSA infection 05/2011  . HTN (hypertension)   . Hx of adenomatous colonic polyps 02/20/2017   01/2017 - 3 cm sigmoid TV adenoma and other smaller polyps - had post-polypectomy bleed Tx w/ clips Consider repeat colonoscopy 3 yrs [name], MD, [name]   . Hyperlipidemia   . implantable cardiac defibrillator-Biotronik    Device Implanted 2006; s/p gen change 03/2011 : bleeding persistent with pocket erosion and infection; explant and reimplant  06/2011  . Ischemic cardiomyopathy    EF 15 to 20% by TTE and TEE in 09/2012.  Severe LV dysfunction  . Obesity    BMI 31 in 09/2012  . Seizure disorder ([place]) latest 09/30/2012  . Seizures ([place])   . Stroke [facility])     PAST SURGICAL HISTORY: Past Surgical History:  Procedure Laterality Date  . CARDIAC CATHETERIZATION N/A 10/12/2015   Procedure: Right Heart Cath;  Surgeon: [name], MD;  Location: [place] CV LAB;  Service: Cardiovascular;  Laterality: N/A;  . CARDIOVERSION  09/24/2012   Procedure: CARDIOVERSION;  Surgeon: [name], MD;  Location: [place];  Service: Cardiovascular;  Laterality: N/A;  . COLONOSCOPY W/ POLYPECTOMY  02/13/2017  . CORONARY ARTERY BYPASS GRAFT      . FLEXIBLE SIGMOIDOSCOPY N/A 02/20/2017   Procedure: FLEXIBLE SIGMOIDOSCOPY;  Surgeon: [name], MD;  Location: [facility] ENDOSCOPY;  Service: Endoscopy;  Laterality: N/A;  . FLEXIBLE SIGMOIDOSCOPY N/A 02/21/2017   Procedure: FLEXIBLE SIGMOIDOSCOPY;  Surgeon: [name], MD;  Location: [facility] ENDOSCOPY;  Service: Endoscopy;  Laterality: N/A;  . ICD    . TEE WITHOUT CARDIOVERSION  09/24/2012   Procedure: TRANSESOPHAGEAL ECHOCARDIOGRAM (TEE);  Surgeon: [name], MD;  Location: [facility] ENDOSCOPY;  Service: Cardiovascular;  Laterality: N/A;  dave/anesth, dl, cindy/echo     MEDICATIONS: Current Outpatient Prescriptions on File Prior to Visit  Medication Sig Dispense Refill  . acetaminophen (TYLENOL) 325 MG tablet Take 650 mg by mouth every 6 (six) hours as needed for mild pain.    [name] amiodarone (PACERONE) 200 MG tablet Take 1 tablet (200 mg total) by mouth daily. 30 tablet 3  . BIDIL 20-37.5 MG tablet TAKE TWO TABLETS BY MOUTH THREE TIMES DAILY 180 tablet 6  . carvedilol (COREG) 3.125 MG tablet Take 1 tablet (3.125 mg total) by mouth 2 (two) times daily. 180 tablet 3  . diclofenac sodium (VOLTAREN) 1 % GEL Apply 4 g topically 4 (four) times daily as needed. (  Patient taking differently: Apply 4 g topically 4 (four) times daily as needed (pain). ) 100 g 5  . DULoxetine (CYMBALTA) 30 MG capsule Take 1 capsule (30 mg total) by mouth daily. 30 capsule 11  . ferrous sulfate 325 (65 FE) MG tablet Take 1 tablet (325 mg total) by mouth 2 (two) times daily with a meal. 60 tablet 3  . furosemide (LASIX) 40 MG tablet TAKE ONE TABLET BY MOUTH ONCE DAILY (Patient taking differently: TAKE 40mg  BY MOUTH ONCE DAILY) 90 tablet 3  . levETIRAcetam (KEPPRA) 750 MG tablet Take 1 tablet (750 mg total) by mouth 2 (two) times daily. 60 tablet 3  . losartan (COZAAR) 25 MG tablet TAKE ONE TABLET BY MOUTH ONCE DAILY (Patient taking differently: TAKE 25mg  BY MOUTH ONCE DAILY) 90 tablet 3  . nystatin (MYCOSTATIN) 100000 UNIT/ML  suspension SWISH, GARGLE AND SPIT OUT 5-10 MILLILITERS EVERY 6 HOURS AS NEEDED 240 mL 11  . phenytoin (DILANTIN) 300 MG ER capsule Take 1 capsule (300 mg total) by mouth at bedtime. 30 capsule 2  . polyethylene glycol (MIRALAX / GLYCOLAX) packet Take 17 g by mouth daily as needed. (Patient taking differently: Take 17 g by mouth daily as needed for mild constipation. ) 14 each 0  . SAVAYSA 60 MG TABS tablet TAKE 1 TABLET BY MOUTH ONCE DAILY (Patient taking differently: TAKE 60mg  BY MOUTH ONCE DAILY) 30 tablet 6  . senna-docusate (SENOKOT-S) 8.6-50 MG tablet Take 2 tablets by mouth 2 (two) times daily. 10 tablet 0  . simvastatin (ZOCOR) 20 MG tablet Take 1 tablet (20 mg total) by mouth daily at 6 PM. Overdue for annual appt w/labs must make appt for future refills 90 tablet 1  . spironolactone (ALDACTONE) 25 MG tablet TAKE ONE-HALF TABLET BY MOUTH ONCE DAILY (Patient taking differently: TAKE 12.5mg  TABLET BY MOUTH ONCE DAILY) 45 tablet 3  . triamcinolone cream (KENALOG) 0.1 % Apply 1 application topically 2 (two) times daily. (Patient taking differently: Apply 1 application topically 2 (two) times daily as needed (rash). ) 30 g 0   No current facility-administered medications on file prior to visit.     ALLERGIES: No Known Allergies  FAMILY HISTORY: Family History  Problem Relation Age of Onset  . Arthritis Mother   . Heart disease Mother   . Heart attack Mother   . Other Father        smoker  . Hypertension Neg Hx        unknown  . Stroke Neg Hx        unknown    SOCIAL HISTORY: Social History   Social History  . Marital status: Married    Spouse name: N/A  . Number of children: N/A  . Years of education: N/A   Occupational History  . Not on file.   Social History Main Topics  . Smoking status: Former Smoker    Types: Pipe    Quit date: 09/21/2008  . Smokeless tobacco: Former Systems developer    Quit date: 09/21/2008  . Alcohol use No  . Drug use: No  . Sexual activity: No   Other  Topics Concern  . Not on file   Social History Narrative   ** Merged History Encounter **        REVIEW OF SYSTEMS: Constitutional: No fevers, chills, or sweats, no generalized fatigue, change in appetite Eyes: No visual changes, double vision, eye pain Ear, nose and throat: No hearing loss, ear pain, nasal congestion, sore throat Cardiovascular: No  chest pain, palpitations Respiratory:  No shortness of breath at rest or with exertion, wheezes GastrointestinaI: No nausea, vomiting, diarrhea, abdominal pain, fecal incontinence Genitourinary:  No dysuria, urinary retention or frequency Musculoskeletal:  No neck pain, back pain Integumentary: No rash, pruritus, skin lesions Neurological: as above Psychiatric: No depression, insomnia, anxiety Endocrine: No palpitations, fatigue, diaphoresis, mood swings, change in appetite, change in weight, increased thirst Hematologic/Lymphatic:  No anemia, purpura, petechiae. Allergic/Immunologic: no itchy/runny eyes, nasal congestion, recent allergic reactions, rashes  PHYSICAL EXAM: Vitals:   10/07/17 1306  BP: 118/76  Pulse: 88  SpO2: 93%   General: No acute distress Head:  Normocephalic/atraumatic Eyes: Fundoscopic exam shows bilateral sharp discs, no vessel changes, exudates, or hemorrhages Neck: supple, no paraspinal tenderness, full range of motion Back: No paraspinal tenderness Heart: regular rate and rhythm Lungs: Clear to auscultation bilaterally. Vascular: No carotid bruits. Skin/Extremities: No rash, no edema Neurological Exam: Mental status: alert and oriented to person, place, and time, no dysarthria or aphasia, Fund of knowledge is appropriate.  Recent and remote memory are impaired. 1/3 delayed recall.  Attention and concentration are normal, spelled WORLD backward as DOLW.  Able to name objects and repeat phrases.  Cranial nerves: CN I: not tested CN II: pupils equal, round and reactive to light, visual fields intact, fundi  unremarkable. CN III, IV, VI:  full range of motion, no nystagmus, no ptosis CN V: facial sensation intact CN VII: upper and lower face symmetric CN VIII: hearing intact to finger rub CN IX, X: gag intact, uvula midline CN XI: sternocleidomastoid and trapezius muscles intact CN XII: tongue midline Bulk & Tone: normal, no fasciculations. Motor: 5/5 throughout with no pronator drift. Sensation: intact to light touch, cold, pin, vibration and joint position sense.  No extinction to double simultaneous stimulation.  Romberg test negative Deep Tendon Reflexes: +1 throughout, no ankle clonus Plantar responses: downgoing bilaterally Cerebellar: no incoordination on finger to nose, heel to shin. No dysdiadochokinesia Gait: narrow-based and steady, able to tandem walk adequately. 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 Head CT showed medial left temporal encephalomalacia. Routine EEG unremarkable. He was admitted last September 2018 and had 4 focal seizures in one day. His wife reports a focal seizure last week, increase Keppra to 1000mg  BID. Continue Dilantin 300mg  qhs for now, re-check Dilantin level. If seizures are better controlled on higher dose Keppra, we may wean off Dilantin slowly in the future. [place] driving laws were discussed with the patient, and he knows to stop driving after a seizure, until 6 months seizure-free. He will follow-up in 4 months and knows to call for any changes.   Thank you for allowing me to participate in the care of this  patient. Please do not hesitate to call for any questions or concerns.   Ellouise Newer, M.D.  CC: Dr. Sharlet Salina

## 2017-10-07 NOTE — Patient Instructions (Addendum)
1. Increase Keppra to 1000mg : Take 1 tablet in AM, 1 tablet in PM. A new prescription has been called in to your pharmacy 2. Continue Dilantin 300mg : Take 1 capsule at night 3. Check Dilantin level  Your provider has requested that you have labwork completed today. Please go to [facility] ([address]) on the second floor of this building before leaving the office today. You do not need to check in. If you are not called within 15 minutes please check with the front desk.   4. Follow-up in 4 months, call for any changes  Seizure Precautions: 1. If medication has been prescribed for you to prevent seizures, take it exactly as directed.  Do not stop taking the medicine without talking to your doctor first, even if you have not had a seizure in a long time.   2. Avoid activities in which a seizure would cause danger to yourself or to others.  Don't operate dangerous machinery, swim alone, or climb in high or dangerous places, such as on ladders, roofs, or girders.  Do not drive unless your doctor says you may.  3. If you have any warning that you may have a seizure, lay down in a safe place where you can't hurt yourself.    4.  No driving for 6 months from last seizure, as per [facility].   Please refer to the following link on the [place] website for more information: http://[email or web address]/[email or web address]/[email or web address]   5.  Maintain good sleep hygiene. Avoid alcohol.  6.  Contact your doctor if you have any problems that may be related to the medicine you are taking.  7.  Call 911 and bring the patient back to the ED if:        A.  The seizure lasts longer than 5 minutes.       B.  The patient doesn't awaken shortly after the seizure  C.  The patient has new problems such as difficulty seeing, speaking or moving  D.  The patient was injured during the seizure  E.  The patient has a temperature over 102 F (39C)  F.  The  patient vomited and now is having trouble breathing

## 2017-10-08 ENCOUNTER — Telehealth: Payer: Self-pay

## 2017-10-08 DIAGNOSIS — G40209 Localization-related (focal) (partial) symptomatic epilepsy and epileptic syndromes with complex partial seizures, not intractable, without status epilepticus: Secondary | ICD-10-CM | POA: Insufficient documentation

## 2017-10-08 LAB — PHENYTOIN LEVEL, TOTAL: PHENYTOIN, TOTAL: 22.8 mg/L — AB (ref 10.0–20.0)

## 2017-10-08 NOTE — Telephone Encounter (Signed)
Called pt.  No answer.  LMOM relaying message below

## 2017-10-08 NOTE — Telephone Encounter (Signed)
-----   Message from [name], MD sent at 10/08/2017  8:40 AM EDT ----- Pls let him/wife know the Dilantin level is a little on the higher side. If he starts feeling dizzy, unsteady, or vision is double, let us know. Otherwise stay on same doses of medications until follow-up with me. Thanks

## 2017-10-17 ENCOUNTER — Ambulatory Visit: Payer: Medicare Other | Admitting: Internal Medicine

## 2017-10-23 ENCOUNTER — Ambulatory Visit (INDEPENDENT_AMBULATORY_CARE_PROVIDER_SITE_OTHER): Payer: Medicare Other | Admitting: *Deleted

## 2017-10-23 DIAGNOSIS — I255 Ischemic cardiomyopathy: Secondary | ICD-10-CM

## 2017-10-24 NOTE — Progress Notes (Signed)
Remote ICD transmission.   

## 2017-10-25 ENCOUNTER — Ambulatory Visit (INDEPENDENT_AMBULATORY_CARE_PROVIDER_SITE_OTHER): Payer: Medicare Other | Admitting: Nurse Practitioner

## 2017-10-25 ENCOUNTER — Other Ambulatory Visit (INDEPENDENT_AMBULATORY_CARE_PROVIDER_SITE_OTHER): Payer: Medicare Other

## 2017-10-25 ENCOUNTER — Encounter: Payer: Self-pay | Admitting: Nurse Practitioner

## 2017-10-25 ENCOUNTER — Encounter: Payer: Self-pay | Admitting: Cardiology

## 2017-10-25 VITALS — BP 118/86 | HR 70 | Temp 97.7°F | Ht 72.0 in | Wt 209.0 lb

## 2017-10-25 DIAGNOSIS — R29898 Other symptoms and signs involving the musculoskeletal system: Secondary | ICD-10-CM

## 2017-10-25 DIAGNOSIS — R569 Unspecified convulsions: Secondary | ICD-10-CM

## 2017-10-25 DIAGNOSIS — R7989 Other specified abnormal findings of blood chemistry: Secondary | ICD-10-CM

## 2017-10-25 DIAGNOSIS — R2681 Unsteadiness on feet: Secondary | ICD-10-CM

## 2017-10-25 DIAGNOSIS — R251 Tremor, unspecified: Secondary | ICD-10-CM | POA: Diagnosis not present

## 2017-10-25 LAB — C-REACTIVE PROTEIN: CRP: 4.1 mg/dL (ref 0.5–20.0)

## 2017-10-25 LAB — CK: CK TOTAL: 40 U/L (ref 7–232)

## 2017-10-25 LAB — T3, FREE: T3, Free: 2.4 pg/mL (ref 2.3–4.2)

## 2017-10-25 LAB — TSH: TSH: 11.09 u[IU]/mL — ABNORMAL HIGH (ref 0.35–4.50)

## 2017-10-25 LAB — T4, FREE: Free T4: 0.91 ng/dL (ref 0.60–1.60)

## 2017-10-25 NOTE — Progress Notes (Signed)
Subjective:  Patient ID: Louis Ford., male    DOB: 1946/03/31  Age: 71 y.o. MRN: [id number]  CC: Tinnitus (ringing in ears (going on for a while) and balance issue (going on for 1 wk))   HPI Tinnitus: Acute on chronic Ringing in bilateral ears for several years. Getting worse in last 37months. Denies any head injury. No recent URI. No change in hearing, no nausea, no syncope, no presthesia.  Weakness and Unsteady Gait: Also complains of unsteady gait and LE weakness. Onset 2years ago and has gradually gotten worse. Denies any fall this year.  Had several fall 2years ago. States he has limited his mobility due to fear of falling. Denies any paresthesia or pain with ambulation, no back pain of injury. Use of cane x 2years Went to inpatient rehab 2years ago due to prolonged hospital stay which lead to generalized weakness.  Reports frequent falls 10yras go which led to use of walker and cane.  Has chronic left knee pain due to OA.  No paresthesia, no dizziness, no change in vision, no change in hearing, no CP, no palpitations.  Head Ct done 08/2017 (no acute finding)  Outpatient Medications Prior to Visit  Medication Sig Dispense Refill  . acetaminophen (TYLENOL) 325 MG tablet Take 650 mg by mouth every 6 (six) hours as needed for mild pain.    [name] amiodarone (PACERONE) 200 MG tablet Take 1 tablet (200 mg total) by mouth daily. 30 tablet 3  . BIDIL 20-37.5 MG tablet TAKE TWO TABLETS BY MOUTH THREE TIMES DAILY 180 tablet 6  . carvedilol (COREG) 3.125 MG tablet Take 1 tablet (3.125 mg total) by mouth 2 (two) times daily. 180 tablet 3  . diclofenac sodium (VOLTAREN) 1 % GEL Apply 4 g topically 4 (four) times daily as needed. (Patient taking differently: Apply 4 g topically 4 (four) times daily as needed (pain). ) 100 g 5  . DULoxetine (CYMBALTA) 30 MG capsule Take 1 capsule (30 mg total) by mouth daily. 30 capsule 11  . ferrous sulfate 325 (65 FE) MG tablet Take 1 tablet (325 mg  total) by mouth 2 (two) times daily with a meal. 60 tablet 3  . furosemide (LASIX) 40 MG tablet TAKE ONE TABLET BY MOUTH ONCE DAILY (Patient taking differently: TAKE 40mg  BY MOUTH ONCE DAILY) 90 tablet 3  . levETIRAcetam (KEPPRA) 1000 MG tablet Take 1 tablet (1,000 mg total) by mouth 2 (two) times daily. 60 tablet 11  . losartan (COZAAR) 25 MG tablet TAKE ONE TABLET BY MOUTH ONCE DAILY (Patient taking differently: TAKE 25mg  BY MOUTH ONCE DAILY) 90 tablet 3  . nystatin (MYCOSTATIN) 100000 UNIT/ML suspension SWISH, GARGLE AND SPIT OUT 5-10 MILLILITERS EVERY 6 HOURS AS NEEDED 240 mL 11  . phenytoin (DILANTIN) 300 MG ER capsule Take 1 capsule (300 mg total) by mouth at bedtime. 30 capsule 11  . polyethylene glycol (MIRALAX / GLYCOLAX) packet Take 17 g by mouth daily as needed. (Patient taking differently: Take 17 g by mouth daily as needed for mild constipation. ) 14 each 0  . SAVAYSA 60 MG TABS tablet TAKE 1 TABLET BY MOUTH ONCE DAILY (Patient taking differently: TAKE 60mg  BY MOUTH ONCE DAILY) 30 tablet 6  . senna-docusate (SENOKOT-S) 8.6-50 MG tablet Take 2 tablets by mouth 2 (two) times daily. 10 tablet 0  . simvastatin (ZOCOR) 20 MG tablet Take 1 tablet (20 mg total) by mouth daily at 6 PM. Overdue for annual appt w/labs must make appt for future refills  90 tablet 1  . spironolactone (ALDACTONE) 25 MG tablet TAKE ONE-HALF TABLET BY MOUTH ONCE DAILY (Patient taking differently: TAKE 12.5mg  TABLET BY MOUTH ONCE DAILY) 45 tablet 3  . triamcinolone cream (KENALOG) 0.1 % Apply 1 application topically 2 (two) times daily. (Patient taking differently: Apply 1 application topically 2 (two) times daily as needed (rash). ) 30 g 0   No facility-administered medications prior to visit.     ROS See HPI  Objective:  BP 118/86   Pulse 70   Temp 97.7 F (36.5 C)   Ht 6' (1.829 m)   Wt 209 lb (94.8 kg)   SpO2 98%   BMI 28.35 kg/m   BP Readings from Last 3 Encounters:  10/25/17 118/86  10/07/17 118/76    09/19/17 118/78    Wt Readings from Last 3 Encounters:  10/25/17 209 lb (94.8 kg)  10/07/17 211 lb (95.7 kg)  09/19/17 207 lb (93.9 kg)    Physical Exam  Constitutional: He is oriented to person, place, and time.  HENT:  Right Ear: Tympanic membrane, external ear and ear canal normal.  Left Ear: Tympanic membrane and ear canal normal.  Normal rinne and weber test  Eyes: Pupils are equal, round, and reactive to light. Conjunctivae and EOM are normal.  Neck: Normal range of motion. Neck supple.  Neurological: He is alert and oriented to person, place, and time. He displays tremor. No sensory deficit. He exhibits abnormal muscle tone. Gait abnormal. Coordination normal. He displays no Babinski's sign on the right side. He displays no Babinski's sign on the left side.  Reflex Scores:      Patellar reflexes are 2+ on the right side and 2+ on the left side. 4/5 bilateral LE muscle strength. Normal hip ROM. Hand tremor (chronic per patient and wife)     Lab Results  Component Value Date   WBC 2.9 (L) 09/19/2017   HGB 16.8 09/19/2017   HCT 52.4 (H) 09/19/2017   PLT 158.0 09/19/2017   GLUCOSE 90 09/19/2017   CHOL 158 07/16/2017   TRIG 82.0 07/16/2017   HDL 51.90 07/16/2017   LDLCALC 90 07/16/2017   ALT 19 09/19/2017   AST 24 09/19/2017   NA 137 09/19/2017   K 4.8 09/19/2017   CL 100 09/19/2017   CREATININE 1.40 09/19/2017   BUN 23 09/19/2017   CO2 31 09/19/2017   TSH 11.09 (H) 10/25/2017   INR 1.51 03/14/2017   HGBA1C 5.8 (H) 09/21/2012    US Pelvis Limited (transabdominal Only)  Result Date: 09/24/2017 CLINICAL DATA:  Initial evaluation for palpable mass at left lower quadrant for 2 years, associated with occasional pain. EXAM: LIMITED ULTRASOUND OF PELVIS TECHNIQUE: Limited transabdominal ultrasound examination of the pelvis was performed. COMPARISON:  None. FINDINGS: Targeted ultrasound of the lower abdomen at the level of the left lower quadrant at palpable area  concern was performed. Ultrasound demonstrates no discrete soft tissue mass. No cyst or other abnormal collection. No definable hernia. No finding identified to correspond with a tactile abnormality. IMPRESSION: No soft tissue mass, hernia, or other abnormality identified to correspond with palpable area of concern at the left lower abdomen. Electronically Signed   By: Jeannine Boga M.D.   On: 09/24/2017 13:26    Assessment & Plan:   Ahron was seen today for tinnitus.  Diagnoses and all orders for this visit:  Weakness of both lower extremities -     Ambulatory referral to [place];  Future -     C-reactive protein; Future -     Levetiracetam level; Future  Unsteady gait -     Ambulatory referral to [place]; Future -     C-reactive protein; Future  Elevated TSH -     TSH; Future -     T4, free; Future -     T3, free; Future  Seizure (HCC) -     Levetiracetam level; Future   I am having [facility]. maintain his amiodarone, acetaminophen, carvedilol, polyethylene glycol, senna-docusate, furosemide, spironolactone, losartan, ferrous sulfate, BIDIL, simvastatin, triamcinolone cream, diclofenac sodium, SAVAYSA, DULoxetine, nystatin, levETIRAcetam, and phenytoin.  No orders of the defined types were placed in this encounter.   Follow-up: No Follow-up on file.  Wilfred Lacy, NP

## 2017-10-25 NOTE — Patient Instructions (Addendum)
Normal CK total and CRP. Abnormal TSH but normal T3 and T4. This could be related to seizure medications. Follow up with pcp to further evaluate thyroid function.

## 2017-10-27 ENCOUNTER — Encounter: Payer: Self-pay | Admitting: Nurse Practitioner

## 2017-10-27 DIAGNOSIS — R29898 Other symptoms and signs involving the musculoskeletal system: Secondary | ICD-10-CM | POA: Insufficient documentation

## 2017-10-27 DIAGNOSIS — R2681 Unsteadiness on feet: Secondary | ICD-10-CM | POA: Insufficient documentation

## 2017-10-27 DIAGNOSIS — R7989 Other specified abnormal findings of blood chemistry: Secondary | ICD-10-CM | POA: Insufficient documentation

## 2017-10-31 ENCOUNTER — Encounter: Payer: Self-pay | Admitting: Nurse Practitioner

## 2017-10-31 LAB — LEVETIRACETAM LEVEL: Keppra (Levetiracetam): 42.9 ug/mL

## 2017-11-01 ENCOUNTER — Telehealth: Payer: Self-pay | Admitting: Internal Medicine

## 2017-11-01 DIAGNOSIS — I130 Hypertensive heart and chronic kidney disease with heart failure and stage 1 through stage 4 chronic kidney disease, or unspecified chronic kidney disease: Secondary | ICD-10-CM | POA: Diagnosis not present

## 2017-11-01 DIAGNOSIS — Z8673 Personal history of transient ischemic attack (TIA), and cerebral infarction without residual deficits: Secondary | ICD-10-CM | POA: Diagnosis not present

## 2017-11-01 DIAGNOSIS — I482 Chronic atrial fibrillation: Secondary | ICD-10-CM | POA: Diagnosis not present

## 2017-11-01 DIAGNOSIS — I5022 Chronic systolic (congestive) heart failure: Secondary | ICD-10-CM | POA: Diagnosis not present

## 2017-11-01 DIAGNOSIS — Z87891 Personal history of nicotine dependence: Secondary | ICD-10-CM | POA: Diagnosis not present

## 2017-11-01 DIAGNOSIS — Z9181 History of falling: Secondary | ICD-10-CM | POA: Diagnosis not present

## 2017-11-01 DIAGNOSIS — Z7901 Long term (current) use of anticoagulants: Secondary | ICD-10-CM | POA: Diagnosis not present

## 2017-11-01 DIAGNOSIS — I2510 Atherosclerotic heart disease of native coronary artery without angina pectoris: Secondary | ICD-10-CM | POA: Diagnosis not present

## 2017-11-01 DIAGNOSIS — M1712 Unilateral primary osteoarthritis, left knee: Secondary | ICD-10-CM | POA: Diagnosis not present

## 2017-11-01 DIAGNOSIS — R1310 Dysphagia, unspecified: Secondary | ICD-10-CM | POA: Diagnosis not present

## 2017-11-01 DIAGNOSIS — Z95810 Presence of automatic (implantable) cardiac defibrillator: Secondary | ICD-10-CM | POA: Diagnosis not present

## 2017-11-01 DIAGNOSIS — M6281 Muscle weakness (generalized): Secondary | ICD-10-CM | POA: Diagnosis not present

## 2017-11-01 DIAGNOSIS — E785 Hyperlipidemia, unspecified: Secondary | ICD-10-CM | POA: Diagnosis not present

## 2017-11-01 DIAGNOSIS — D649 Anemia, unspecified: Secondary | ICD-10-CM | POA: Diagnosis not present

## 2017-11-01 DIAGNOSIS — M109 Gout, unspecified: Secondary | ICD-10-CM | POA: Diagnosis not present

## 2017-11-01 DIAGNOSIS — G4700 Insomnia, unspecified: Secondary | ICD-10-CM | POA: Diagnosis not present

## 2017-11-01 DIAGNOSIS — N183 Chronic kidney disease, stage 3 (moderate): Secondary | ICD-10-CM | POA: Diagnosis not present

## 2017-11-01 NOTE — Telephone Encounter (Signed)
LVM giving verbals to tatiana

## 2017-11-01 NOTE — Telephone Encounter (Signed)
Fine

## 2017-11-01 NOTE — Telephone Encounter (Signed)
tatiana from [facility] [phone]  Need verbals for PT 1 week 1 2 week 4

## 2017-11-05 DIAGNOSIS — Z8673 Personal history of transient ischemic attack (TIA), and cerebral infarction without residual deficits: Secondary | ICD-10-CM | POA: Diagnosis not present

## 2017-11-05 DIAGNOSIS — I5022 Chronic systolic (congestive) heart failure: Secondary | ICD-10-CM | POA: Diagnosis not present

## 2017-11-05 DIAGNOSIS — M6281 Muscle weakness (generalized): Secondary | ICD-10-CM | POA: Diagnosis not present

## 2017-11-05 DIAGNOSIS — Z7901 Long term (current) use of anticoagulants: Secondary | ICD-10-CM | POA: Diagnosis not present

## 2017-11-05 DIAGNOSIS — R1310 Dysphagia, unspecified: Secondary | ICD-10-CM | POA: Diagnosis not present

## 2017-11-05 DIAGNOSIS — I2510 Atherosclerotic heart disease of native coronary artery without angina pectoris: Secondary | ICD-10-CM | POA: Diagnosis not present

## 2017-11-05 DIAGNOSIS — M109 Gout, unspecified: Secondary | ICD-10-CM | POA: Diagnosis not present

## 2017-11-05 DIAGNOSIS — I482 Chronic atrial fibrillation: Secondary | ICD-10-CM | POA: Diagnosis not present

## 2017-11-05 DIAGNOSIS — D649 Anemia, unspecified: Secondary | ICD-10-CM | POA: Diagnosis not present

## 2017-11-05 DIAGNOSIS — G4700 Insomnia, unspecified: Secondary | ICD-10-CM | POA: Diagnosis not present

## 2017-11-05 DIAGNOSIS — M1712 Unilateral primary osteoarthritis, left knee: Secondary | ICD-10-CM | POA: Diagnosis not present

## 2017-11-05 DIAGNOSIS — Z9181 History of falling: Secondary | ICD-10-CM | POA: Diagnosis not present

## 2017-11-05 DIAGNOSIS — E785 Hyperlipidemia, unspecified: Secondary | ICD-10-CM | POA: Diagnosis not present

## 2017-11-05 DIAGNOSIS — I130 Hypertensive heart and chronic kidney disease with heart failure and stage 1 through stage 4 chronic kidney disease, or unspecified chronic kidney disease: Secondary | ICD-10-CM | POA: Diagnosis not present

## 2017-11-05 DIAGNOSIS — Z95810 Presence of automatic (implantable) cardiac defibrillator: Secondary | ICD-10-CM | POA: Diagnosis not present

## 2017-11-05 DIAGNOSIS — N183 Chronic kidney disease, stage 3 (moderate): Secondary | ICD-10-CM | POA: Diagnosis not present

## 2017-11-05 DIAGNOSIS — Z87891 Personal history of nicotine dependence: Secondary | ICD-10-CM | POA: Diagnosis not present

## 2017-11-06 DIAGNOSIS — Z8673 Personal history of transient ischemic attack (TIA), and cerebral infarction without residual deficits: Secondary | ICD-10-CM | POA: Diagnosis not present

## 2017-11-06 DIAGNOSIS — Z95810 Presence of automatic (implantable) cardiac defibrillator: Secondary | ICD-10-CM | POA: Diagnosis not present

## 2017-11-06 DIAGNOSIS — D649 Anemia, unspecified: Secondary | ICD-10-CM | POA: Diagnosis not present

## 2017-11-06 DIAGNOSIS — G4700 Insomnia, unspecified: Secondary | ICD-10-CM | POA: Diagnosis not present

## 2017-11-06 DIAGNOSIS — Z87891 Personal history of nicotine dependence: Secondary | ICD-10-CM | POA: Diagnosis not present

## 2017-11-06 DIAGNOSIS — I130 Hypertensive heart and chronic kidney disease with heart failure and stage 1 through stage 4 chronic kidney disease, or unspecified chronic kidney disease: Secondary | ICD-10-CM | POA: Diagnosis not present

## 2017-11-06 DIAGNOSIS — Z7901 Long term (current) use of anticoagulants: Secondary | ICD-10-CM | POA: Diagnosis not present

## 2017-11-06 DIAGNOSIS — E785 Hyperlipidemia, unspecified: Secondary | ICD-10-CM | POA: Diagnosis not present

## 2017-11-06 DIAGNOSIS — I5022 Chronic systolic (congestive) heart failure: Secondary | ICD-10-CM | POA: Diagnosis not present

## 2017-11-06 DIAGNOSIS — M6281 Muscle weakness (generalized): Secondary | ICD-10-CM | POA: Diagnosis not present

## 2017-11-06 DIAGNOSIS — M1712 Unilateral primary osteoarthritis, left knee: Secondary | ICD-10-CM | POA: Diagnosis not present

## 2017-11-06 DIAGNOSIS — Z9181 History of falling: Secondary | ICD-10-CM | POA: Diagnosis not present

## 2017-11-06 DIAGNOSIS — I482 Chronic atrial fibrillation: Secondary | ICD-10-CM | POA: Diagnosis not present

## 2017-11-06 DIAGNOSIS — R1310 Dysphagia, unspecified: Secondary | ICD-10-CM | POA: Diagnosis not present

## 2017-11-06 DIAGNOSIS — N183 Chronic kidney disease, stage 3 (moderate): Secondary | ICD-10-CM | POA: Diagnosis not present

## 2017-11-06 DIAGNOSIS — I2510 Atherosclerotic heart disease of native coronary artery without angina pectoris: Secondary | ICD-10-CM | POA: Diagnosis not present

## 2017-11-06 DIAGNOSIS — M109 Gout, unspecified: Secondary | ICD-10-CM | POA: Diagnosis not present

## 2017-11-07 ENCOUNTER — Ambulatory Visit (INDEPENDENT_AMBULATORY_CARE_PROVIDER_SITE_OTHER): Payer: Medicare Other | Admitting: Internal Medicine

## 2017-11-07 ENCOUNTER — Other Ambulatory Visit (INDEPENDENT_AMBULATORY_CARE_PROVIDER_SITE_OTHER): Payer: Medicare Other

## 2017-11-07 ENCOUNTER — Encounter: Payer: Self-pay | Admitting: Internal Medicine

## 2017-11-07 VITALS — BP 130/90 | HR 75 | Temp 97.8°F | Ht 72.0 in | Wt 208.0 lb

## 2017-11-07 DIAGNOSIS — R351 Nocturia: Secondary | ICD-10-CM

## 2017-11-07 DIAGNOSIS — N5319 Other ejaculatory dysfunction: Secondary | ICD-10-CM | POA: Diagnosis not present

## 2017-11-07 DIAGNOSIS — D509 Iron deficiency anemia, unspecified: Secondary | ICD-10-CM | POA: Diagnosis not present

## 2017-11-07 LAB — PSA: PSA: 7.61 ng/mL — AB (ref 0.10–4.00)

## 2017-11-07 MED ORDER — FERROUS SULFATE 325 (65 FE) MG PO TABS: 325.0000 mg | ORAL_TABLET | Freq: Two times a day (BID) | ORAL | 3 refills | Status: DC

## 2017-11-07 MED ORDER — TAMSULOSIN HCL 0.4 MG PO CAPS: 0.4000 mg | ORAL_CAPSULE | Freq: Every day | ORAL | 3 refills | Status: DC

## 2017-11-07 MED ORDER — TRIAMCINOLONE ACETONIDE 0.1 % EX CREA: 1.0000 "application " | TOPICAL_CREAM | Freq: Two times a day (BID) | CUTANEOUS | 0 refills | Status: AC

## 2017-11-07 NOTE — Patient Instructions (Signed)
The medicine for the bladder is called flomax that you take at night time.

## 2017-11-07 NOTE — Progress Notes (Signed)
   Subjective:    Patient ID: Louis Ford., male    DOB: [birth year], 71 y.o.   MRN: [id number]  HPI The patient is a 71 YO man coming in for ejaculation problem and wants to see a urologist. He is having problems maintaining erection as well as early ejaculation and urine leakage with ejaculation. Going on some time but he was embarrassed to ask about this. Urinates at night time about 2-4 times per night. Takes fluid pill during the day and urinates frequently.   Review of Systems  Constitutional: Negative.   Respiratory: Negative for cough, chest tightness and shortness of breath.   Cardiovascular: Negative for chest pain, palpitations and leg swelling.  Gastrointestinal: Negative for abdominal distention, abdominal pain, constipation, diarrhea, nausea and vomiting.  Genitourinary: Positive for enuresis and frequency. Negative for difficulty urinating, discharge, dysuria, flank pain, penile pain and testicular pain.  Musculoskeletal: Negative.   Neurological: Negative.   Psychiatric/Behavioral: Negative.       Objective:   Physical Exam  Constitutional: He is oriented to person, place, and time. He appears well-developed and well-nourished.  HENT:  Head: Normocephalic and atraumatic.  Eyes: EOM are normal.  Neck: Normal range of motion.  Cardiovascular: Normal rate and regular rhythm.  Pulmonary/Chest: Effort normal and breath sounds normal. No respiratory distress. He has no wheezes. He has no rales.  Abdominal: Soft.  Genitourinary:  Genitourinary Comments: Declined rectal exam  Musculoskeletal: He exhibits no edema.  Neurological: He is alert and oriented to person, place, and time. Coordination normal.  Skin: Skin is warm and dry.   Vitals:   11/07/17 1435  BP: 130/90  Pulse: 75  Temp: 97.8 F (36.6 C)  TempSrc: Oral  SpO2: 99%  Weight: 208 lb (94.3 kg)  Height: 6' (1.829 m)      Assessment & Plan:

## 2017-11-08 DIAGNOSIS — I130 Hypertensive heart and chronic kidney disease with heart failure and stage 1 through stage 4 chronic kidney disease, or unspecified chronic kidney disease: Secondary | ICD-10-CM | POA: Diagnosis not present

## 2017-11-08 DIAGNOSIS — N183 Chronic kidney disease, stage 3 (moderate): Secondary | ICD-10-CM | POA: Diagnosis not present

## 2017-11-08 DIAGNOSIS — D649 Anemia, unspecified: Secondary | ICD-10-CM | POA: Diagnosis not present

## 2017-11-08 DIAGNOSIS — N5319 Other ejaculatory dysfunction: Secondary | ICD-10-CM | POA: Insufficient documentation

## 2017-11-08 DIAGNOSIS — G4700 Insomnia, unspecified: Secondary | ICD-10-CM | POA: Diagnosis not present

## 2017-11-08 DIAGNOSIS — M109 Gout, unspecified: Secondary | ICD-10-CM | POA: Diagnosis not present

## 2017-11-08 DIAGNOSIS — Z8673 Personal history of transient ischemic attack (TIA), and cerebral infarction without residual deficits: Secondary | ICD-10-CM | POA: Diagnosis not present

## 2017-11-08 DIAGNOSIS — E785 Hyperlipidemia, unspecified: Secondary | ICD-10-CM | POA: Diagnosis not present

## 2017-11-08 DIAGNOSIS — Z87891 Personal history of nicotine dependence: Secondary | ICD-10-CM | POA: Diagnosis not present

## 2017-11-08 DIAGNOSIS — R1310 Dysphagia, unspecified: Secondary | ICD-10-CM | POA: Diagnosis not present

## 2017-11-08 DIAGNOSIS — Z9181 History of falling: Secondary | ICD-10-CM | POA: Diagnosis not present

## 2017-11-08 DIAGNOSIS — M1712 Unilateral primary osteoarthritis, left knee: Secondary | ICD-10-CM | POA: Diagnosis not present

## 2017-11-08 DIAGNOSIS — I2510 Atherosclerotic heart disease of native coronary artery without angina pectoris: Secondary | ICD-10-CM | POA: Diagnosis not present

## 2017-11-08 DIAGNOSIS — Z95810 Presence of automatic (implantable) cardiac defibrillator: Secondary | ICD-10-CM | POA: Diagnosis not present

## 2017-11-08 DIAGNOSIS — Z7901 Long term (current) use of anticoagulants: Secondary | ICD-10-CM | POA: Diagnosis not present

## 2017-11-08 DIAGNOSIS — M6281 Muscle weakness (generalized): Secondary | ICD-10-CM | POA: Diagnosis not present

## 2017-11-08 DIAGNOSIS — I482 Chronic atrial fibrillation: Secondary | ICD-10-CM | POA: Diagnosis not present

## 2017-11-08 DIAGNOSIS — I5022 Chronic systolic (congestive) heart failure: Secondary | ICD-10-CM | POA: Diagnosis not present

## 2017-11-08 NOTE — Assessment & Plan Note (Signed)
Referral to urology. Checking PSA today. Rx for flomax to ease nocturia symptoms to see if there is a component of BPH.

## 2017-11-13 DIAGNOSIS — R1310 Dysphagia, unspecified: Secondary | ICD-10-CM | POA: Diagnosis not present

## 2017-11-13 DIAGNOSIS — E785 Hyperlipidemia, unspecified: Secondary | ICD-10-CM | POA: Diagnosis not present

## 2017-11-13 DIAGNOSIS — N183 Chronic kidney disease, stage 3 (moderate): Secondary | ICD-10-CM | POA: Diagnosis not present

## 2017-11-13 DIAGNOSIS — D649 Anemia, unspecified: Secondary | ICD-10-CM | POA: Diagnosis not present

## 2017-11-13 DIAGNOSIS — I482 Chronic atrial fibrillation: Secondary | ICD-10-CM | POA: Diagnosis not present

## 2017-11-13 DIAGNOSIS — Z8673 Personal history of transient ischemic attack (TIA), and cerebral infarction without residual deficits: Secondary | ICD-10-CM | POA: Diagnosis not present

## 2017-11-13 DIAGNOSIS — Z87891 Personal history of nicotine dependence: Secondary | ICD-10-CM | POA: Diagnosis not present

## 2017-11-13 DIAGNOSIS — M1712 Unilateral primary osteoarthritis, left knee: Secondary | ICD-10-CM | POA: Diagnosis not present

## 2017-11-13 DIAGNOSIS — Z9181 History of falling: Secondary | ICD-10-CM | POA: Diagnosis not present

## 2017-11-13 DIAGNOSIS — M6281 Muscle weakness (generalized): Secondary | ICD-10-CM | POA: Diagnosis not present

## 2017-11-13 DIAGNOSIS — I2510 Atherosclerotic heart disease of native coronary artery without angina pectoris: Secondary | ICD-10-CM | POA: Diagnosis not present

## 2017-11-13 DIAGNOSIS — M109 Gout, unspecified: Secondary | ICD-10-CM | POA: Diagnosis not present

## 2017-11-13 DIAGNOSIS — I130 Hypertensive heart and chronic kidney disease with heart failure and stage 1 through stage 4 chronic kidney disease, or unspecified chronic kidney disease: Secondary | ICD-10-CM | POA: Diagnosis not present

## 2017-11-13 DIAGNOSIS — Z95810 Presence of automatic (implantable) cardiac defibrillator: Secondary | ICD-10-CM | POA: Diagnosis not present

## 2017-11-13 DIAGNOSIS — G4700 Insomnia, unspecified: Secondary | ICD-10-CM | POA: Diagnosis not present

## 2017-11-13 DIAGNOSIS — I5022 Chronic systolic (congestive) heart failure: Secondary | ICD-10-CM | POA: Diagnosis not present

## 2017-11-13 DIAGNOSIS — Z7901 Long term (current) use of anticoagulants: Secondary | ICD-10-CM | POA: Diagnosis not present

## 2017-11-15 DIAGNOSIS — M6281 Muscle weakness (generalized): Secondary | ICD-10-CM | POA: Diagnosis not present

## 2017-11-15 DIAGNOSIS — I482 Chronic atrial fibrillation: Secondary | ICD-10-CM | POA: Diagnosis not present

## 2017-11-15 DIAGNOSIS — I130 Hypertensive heart and chronic kidney disease with heart failure and stage 1 through stage 4 chronic kidney disease, or unspecified chronic kidney disease: Secondary | ICD-10-CM | POA: Diagnosis not present

## 2017-11-15 DIAGNOSIS — D649 Anemia, unspecified: Secondary | ICD-10-CM | POA: Diagnosis not present

## 2017-11-15 DIAGNOSIS — E785 Hyperlipidemia, unspecified: Secondary | ICD-10-CM | POA: Diagnosis not present

## 2017-11-15 DIAGNOSIS — I2510 Atherosclerotic heart disease of native coronary artery without angina pectoris: Secondary | ICD-10-CM | POA: Diagnosis not present

## 2017-11-15 DIAGNOSIS — Z87891 Personal history of nicotine dependence: Secondary | ICD-10-CM | POA: Diagnosis not present

## 2017-11-15 DIAGNOSIS — R1310 Dysphagia, unspecified: Secondary | ICD-10-CM | POA: Diagnosis not present

## 2017-11-15 DIAGNOSIS — M1712 Unilateral primary osteoarthritis, left knee: Secondary | ICD-10-CM | POA: Diagnosis not present

## 2017-11-15 DIAGNOSIS — G4700 Insomnia, unspecified: Secondary | ICD-10-CM | POA: Diagnosis not present

## 2017-11-15 DIAGNOSIS — M109 Gout, unspecified: Secondary | ICD-10-CM | POA: Diagnosis not present

## 2017-11-15 DIAGNOSIS — Z9181 History of falling: Secondary | ICD-10-CM | POA: Diagnosis not present

## 2017-11-15 DIAGNOSIS — I5022 Chronic systolic (congestive) heart failure: Secondary | ICD-10-CM | POA: Diagnosis not present

## 2017-11-15 DIAGNOSIS — Z95810 Presence of automatic (implantable) cardiac defibrillator: Secondary | ICD-10-CM | POA: Diagnosis not present

## 2017-11-15 DIAGNOSIS — Z7901 Long term (current) use of anticoagulants: Secondary | ICD-10-CM | POA: Diagnosis not present

## 2017-11-15 DIAGNOSIS — Z8673 Personal history of transient ischemic attack (TIA), and cerebral infarction without residual deficits: Secondary | ICD-10-CM | POA: Diagnosis not present

## 2017-11-15 DIAGNOSIS — N183 Chronic kidney disease, stage 3 (moderate): Secondary | ICD-10-CM | POA: Diagnosis not present

## 2017-11-19 DIAGNOSIS — M6281 Muscle weakness (generalized): Secondary | ICD-10-CM | POA: Diagnosis not present

## 2017-11-19 DIAGNOSIS — E785 Hyperlipidemia, unspecified: Secondary | ICD-10-CM | POA: Diagnosis not present

## 2017-11-19 DIAGNOSIS — G4700 Insomnia, unspecified: Secondary | ICD-10-CM | POA: Diagnosis not present

## 2017-11-19 DIAGNOSIS — Z95810 Presence of automatic (implantable) cardiac defibrillator: Secondary | ICD-10-CM | POA: Diagnosis not present

## 2017-11-19 DIAGNOSIS — R1310 Dysphagia, unspecified: Secondary | ICD-10-CM | POA: Diagnosis not present

## 2017-11-19 DIAGNOSIS — Z9181 History of falling: Secondary | ICD-10-CM | POA: Diagnosis not present

## 2017-11-19 DIAGNOSIS — I2510 Atherosclerotic heart disease of native coronary artery without angina pectoris: Secondary | ICD-10-CM | POA: Diagnosis not present

## 2017-11-19 DIAGNOSIS — I482 Chronic atrial fibrillation: Secondary | ICD-10-CM | POA: Diagnosis not present

## 2017-11-19 DIAGNOSIS — I130 Hypertensive heart and chronic kidney disease with heart failure and stage 1 through stage 4 chronic kidney disease, or unspecified chronic kidney disease: Secondary | ICD-10-CM | POA: Diagnosis not present

## 2017-11-19 DIAGNOSIS — Z87891 Personal history of nicotine dependence: Secondary | ICD-10-CM | POA: Diagnosis not present

## 2017-11-19 DIAGNOSIS — Z8673 Personal history of transient ischemic attack (TIA), and cerebral infarction without residual deficits: Secondary | ICD-10-CM | POA: Diagnosis not present

## 2017-11-19 DIAGNOSIS — M109 Gout, unspecified: Secondary | ICD-10-CM | POA: Diagnosis not present

## 2017-11-19 DIAGNOSIS — Z7901 Long term (current) use of anticoagulants: Secondary | ICD-10-CM | POA: Diagnosis not present

## 2017-11-19 DIAGNOSIS — M1712 Unilateral primary osteoarthritis, left knee: Secondary | ICD-10-CM | POA: Diagnosis not present

## 2017-11-19 DIAGNOSIS — N183 Chronic kidney disease, stage 3 (moderate): Secondary | ICD-10-CM | POA: Diagnosis not present

## 2017-11-19 DIAGNOSIS — I5022 Chronic systolic (congestive) heart failure: Secondary | ICD-10-CM | POA: Diagnosis not present

## 2017-11-19 DIAGNOSIS — D649 Anemia, unspecified: Secondary | ICD-10-CM | POA: Diagnosis not present

## 2017-11-19 LAB — CUP PACEART REMOTE DEVICE CHECK
Brady Statistic RV Percent Paced: 0 %
Date Time Interrogation Session: 20181127052532
HIGH POWER IMPEDANCE MEASURED VALUE: 58 Ohm
Implantable Lead Serial Number: 10260064
Lead Channel Pacing Threshold Amplitude: 0.6 V
Lead Channel Pacing Threshold Pulse Width: 0.4 ms
Lead Channel Setting Pacing Amplitude: 2 V
MDC IDC LEAD IMPLANT DT: 20060927
MDC IDC LEAD LOCATION: 753860
MDC IDC MSMT BATTERY REMAINING PERCENTAGE: 22 %
MDC IDC MSMT BATTERY VOLTAGE: 2.94 V
MDC IDC MSMT LEADCHNL RV IMPEDANCE VALUE: 456 Ohm
MDC IDC PG IMPLANT DT: 20120413
MDC IDC SET LEADCHNL RV PACING PULSEWIDTH: 0.4 ms
Pulse Gen Serial Number: 60613417

## 2017-11-22 ENCOUNTER — Telehealth: Payer: Self-pay | Admitting: Internal Medicine

## 2017-11-22 DIAGNOSIS — Z87891 Personal history of nicotine dependence: Secondary | ICD-10-CM | POA: Diagnosis not present

## 2017-11-22 DIAGNOSIS — I482 Chronic atrial fibrillation: Secondary | ICD-10-CM | POA: Diagnosis not present

## 2017-11-22 DIAGNOSIS — I5022 Chronic systolic (congestive) heart failure: Secondary | ICD-10-CM | POA: Diagnosis not present

## 2017-11-22 DIAGNOSIS — M109 Gout, unspecified: Secondary | ICD-10-CM | POA: Diagnosis not present

## 2017-11-22 DIAGNOSIS — D649 Anemia, unspecified: Secondary | ICD-10-CM | POA: Diagnosis not present

## 2017-11-22 DIAGNOSIS — I2510 Atherosclerotic heart disease of native coronary artery without angina pectoris: Secondary | ICD-10-CM | POA: Diagnosis not present

## 2017-11-22 DIAGNOSIS — I130 Hypertensive heart and chronic kidney disease with heart failure and stage 1 through stage 4 chronic kidney disease, or unspecified chronic kidney disease: Secondary | ICD-10-CM | POA: Diagnosis not present

## 2017-11-22 DIAGNOSIS — Z9181 History of falling: Secondary | ICD-10-CM | POA: Diagnosis not present

## 2017-11-22 DIAGNOSIS — Z7901 Long term (current) use of anticoagulants: Secondary | ICD-10-CM | POA: Diagnosis not present

## 2017-11-22 DIAGNOSIS — M6281 Muscle weakness (generalized): Secondary | ICD-10-CM | POA: Diagnosis not present

## 2017-11-22 DIAGNOSIS — M1712 Unilateral primary osteoarthritis, left knee: Secondary | ICD-10-CM | POA: Diagnosis not present

## 2017-11-22 DIAGNOSIS — E785 Hyperlipidemia, unspecified: Secondary | ICD-10-CM | POA: Diagnosis not present

## 2017-11-22 DIAGNOSIS — G4700 Insomnia, unspecified: Secondary | ICD-10-CM | POA: Diagnosis not present

## 2017-11-22 DIAGNOSIS — Z95810 Presence of automatic (implantable) cardiac defibrillator: Secondary | ICD-10-CM | POA: Diagnosis not present

## 2017-11-22 DIAGNOSIS — R1310 Dysphagia, unspecified: Secondary | ICD-10-CM | POA: Diagnosis not present

## 2017-11-22 DIAGNOSIS — N183 Chronic kidney disease, stage 3 (moderate): Secondary | ICD-10-CM | POA: Diagnosis not present

## 2017-11-22 DIAGNOSIS — Z8673 Personal history of transient ischemic attack (TIA), and cerebral infarction without residual deficits: Secondary | ICD-10-CM | POA: Diagnosis not present

## 2017-11-22 NOTE — Telephone Encounter (Signed)
Copied from [place] [id number]. Topic: General - Other >> Nov 22, 2017  3:06 PM [name], NT wrote: [name] is calling from [facility], she is wanting the doctor to know ... Increased confusion and hallucination for 2 weeks, very fatigue since he has been on 2 anti seizure medication instead of 1 also involuntary muscle twitching with the 2 instead of just 1. [name] states the hallucination really concerns her.   [name] [phone]

## 2017-11-22 NOTE — Telephone Encounter (Signed)
LVM for nicole in regard to contacting neurologist

## 2017-11-22 NOTE — Telephone Encounter (Signed)
Needs to call neurologist for medication adjustment.

## 2017-11-26 DIAGNOSIS — I482 Chronic atrial fibrillation: Secondary | ICD-10-CM | POA: Diagnosis not present

## 2017-11-26 DIAGNOSIS — I130 Hypertensive heart and chronic kidney disease with heart failure and stage 1 through stage 4 chronic kidney disease, or unspecified chronic kidney disease: Secondary | ICD-10-CM | POA: Diagnosis not present

## 2017-11-26 DIAGNOSIS — G4700 Insomnia, unspecified: Secondary | ICD-10-CM | POA: Diagnosis not present

## 2017-11-26 DIAGNOSIS — Z9181 History of falling: Secondary | ICD-10-CM | POA: Diagnosis not present

## 2017-11-26 DIAGNOSIS — Z87891 Personal history of nicotine dependence: Secondary | ICD-10-CM | POA: Diagnosis not present

## 2017-11-26 DIAGNOSIS — M1712 Unilateral primary osteoarthritis, left knee: Secondary | ICD-10-CM | POA: Diagnosis not present

## 2017-11-26 DIAGNOSIS — E785 Hyperlipidemia, unspecified: Secondary | ICD-10-CM | POA: Diagnosis not present

## 2017-11-26 DIAGNOSIS — D649 Anemia, unspecified: Secondary | ICD-10-CM | POA: Diagnosis not present

## 2017-11-26 DIAGNOSIS — R1310 Dysphagia, unspecified: Secondary | ICD-10-CM | POA: Diagnosis not present

## 2017-11-26 DIAGNOSIS — Z7901 Long term (current) use of anticoagulants: Secondary | ICD-10-CM | POA: Diagnosis not present

## 2017-11-26 DIAGNOSIS — I5022 Chronic systolic (congestive) heart failure: Secondary | ICD-10-CM | POA: Diagnosis not present

## 2017-11-26 DIAGNOSIS — M109 Gout, unspecified: Secondary | ICD-10-CM | POA: Diagnosis not present

## 2017-11-26 DIAGNOSIS — I2510 Atherosclerotic heart disease of native coronary artery without angina pectoris: Secondary | ICD-10-CM | POA: Diagnosis not present

## 2017-11-26 DIAGNOSIS — Z95810 Presence of automatic (implantable) cardiac defibrillator: Secondary | ICD-10-CM | POA: Diagnosis not present

## 2017-11-26 DIAGNOSIS — Z8673 Personal history of transient ischemic attack (TIA), and cerebral infarction without residual deficits: Secondary | ICD-10-CM | POA: Diagnosis not present

## 2017-11-26 DIAGNOSIS — N183 Chronic kidney disease, stage 3 (moderate): Secondary | ICD-10-CM | POA: Diagnosis not present

## 2017-11-26 DIAGNOSIS — M6281 Muscle weakness (generalized): Secondary | ICD-10-CM | POA: Diagnosis not present

## 2017-11-27 ENCOUNTER — Encounter: Payer: Self-pay | Admitting: Internal Medicine

## 2017-11-27 ENCOUNTER — Ambulatory Visit (INDEPENDENT_AMBULATORY_CARE_PROVIDER_SITE_OTHER): Payer: Medicare Other | Admitting: Internal Medicine

## 2017-11-27 ENCOUNTER — Other Ambulatory Visit (INDEPENDENT_AMBULATORY_CARE_PROVIDER_SITE_OTHER): Payer: Medicare Other

## 2017-11-27 VITALS — BP 110/82 | HR 81 | Temp 98.0°F | Ht 72.0 in | Wt 215.0 lb

## 2017-11-27 DIAGNOSIS — Z7901 Long term (current) use of anticoagulants: Secondary | ICD-10-CM

## 2017-11-27 DIAGNOSIS — I10 Essential (primary) hypertension: Secondary | ICD-10-CM

## 2017-11-27 DIAGNOSIS — D649 Anemia, unspecified: Secondary | ICD-10-CM | POA: Diagnosis not present

## 2017-11-27 DIAGNOSIS — Z95810 Presence of automatic (implantable) cardiac defibrillator: Secondary | ICD-10-CM

## 2017-11-27 DIAGNOSIS — Z9181 History of falling: Secondary | ICD-10-CM

## 2017-11-27 DIAGNOSIS — Z8673 Personal history of transient ischemic attack (TIA), and cerebral infarction without residual deficits: Secondary | ICD-10-CM

## 2017-11-27 DIAGNOSIS — G40209 Localization-related (focal) (partial) symptomatic epilepsy and epileptic syndromes with complex partial seizures, not intractable, without status epilepticus: Secondary | ICD-10-CM | POA: Diagnosis not present

## 2017-11-27 DIAGNOSIS — R8290 Unspecified abnormal findings in urine: Secondary | ICD-10-CM | POA: Diagnosis not present

## 2017-11-27 DIAGNOSIS — E785 Hyperlipidemia, unspecified: Secondary | ICD-10-CM | POA: Diagnosis not present

## 2017-11-27 DIAGNOSIS — R443 Hallucinations, unspecified: Secondary | ICD-10-CM

## 2017-11-27 DIAGNOSIS — I130 Hypertensive heart and chronic kidney disease with heart failure and stage 1 through stage 4 chronic kidney disease, or unspecified chronic kidney disease: Secondary | ICD-10-CM | POA: Diagnosis not present

## 2017-11-27 DIAGNOSIS — I5022 Chronic systolic (congestive) heart failure: Secondary | ICD-10-CM | POA: Diagnosis not present

## 2017-11-27 DIAGNOSIS — I482 Chronic atrial fibrillation: Secondary | ICD-10-CM | POA: Diagnosis not present

## 2017-11-27 DIAGNOSIS — G4700 Insomnia, unspecified: Secondary | ICD-10-CM

## 2017-11-27 DIAGNOSIS — N183 Chronic kidney disease, stage 3 (moderate): Secondary | ICD-10-CM | POA: Diagnosis not present

## 2017-11-27 DIAGNOSIS — R1310 Dysphagia, unspecified: Secondary | ICD-10-CM | POA: Diagnosis not present

## 2017-11-27 DIAGNOSIS — M109 Gout, unspecified: Secondary | ICD-10-CM | POA: Diagnosis not present

## 2017-11-27 DIAGNOSIS — I2510 Atherosclerotic heart disease of native coronary artery without angina pectoris: Secondary | ICD-10-CM | POA: Diagnosis not present

## 2017-11-27 DIAGNOSIS — Z87891 Personal history of nicotine dependence: Secondary | ICD-10-CM

## 2017-11-27 DIAGNOSIS — M6281 Muscle weakness (generalized): Secondary | ICD-10-CM | POA: Diagnosis not present

## 2017-11-27 DIAGNOSIS — M1712 Unilateral primary osteoarthritis, left knee: Secondary | ICD-10-CM | POA: Diagnosis not present

## 2017-11-27 LAB — POCT URINALYSIS DIPSTICK
BILIRUBIN UA: NEGATIVE
Blood, UA: NEGATIVE
GLUCOSE UA: NEGATIVE
KETONES UA: NEGATIVE
Leukocytes, UA: NEGATIVE
NITRITE UA: NEGATIVE
PH UA: 6 (ref 5.0–8.0)
Protein, UA: 15
SPEC GRAV UA: 1.02 (ref 1.010–1.025)
UROBILINOGEN UA: 0.2 U/dL

## 2017-11-27 LAB — COMPREHENSIVE METABOLIC PANEL
ALK PHOS: 120 U/L — AB (ref 39–117)
ALT: 16 U/L (ref 0–53)
AST: 21 U/L (ref 0–37)
Albumin: 4.4 g/dL (ref 3.5–5.2)
BUN: 25 mg/dL — ABNORMAL HIGH (ref 6–23)
CALCIUM: 9.3 mg/dL (ref 8.4–10.5)
CO2: 22 meq/L (ref 19–32)
Chloride: 99 mEq/L (ref 96–112)
Creatinine, Ser: 1.27 mg/dL (ref 0.40–1.50)
GFR: 72.31 mL/min (ref >60.00)
GLUCOSE: 95 mg/dL (ref 70–99)
POTASSIUM: 4.7 meq/L (ref 3.5–5.1)
Sodium: 132 mEq/L — ABNORMAL LOW (ref 135–145)
Total Bilirubin: 0.5 mg/dL (ref 0.2–1.2)
Total Protein: 8.5 g/dL — ABNORMAL HIGH (ref 6.0–8.3)

## 2017-11-27 LAB — VITAMIN B12: Vitamin B-12: 321 pg/mL (ref 211–911)

## 2017-11-27 LAB — CBC
HEMATOCRIT: 53.5 % — AB (ref 39.0–52.0)
HEMOGLOBIN: 17.2 g/dL — AB (ref 13.0–17.0)
MCHC: 32.1 g/dL (ref 30.0–36.0)
MCV: 96.1 fl (ref 78.0–100.0)
Platelets: 133 10*3/uL — ABNORMAL LOW (ref 150.0–400.0)
RBC: 5.57 Mil/uL (ref 4.22–5.81)
RDW: 15.2 % (ref 11.5–15.5)
WBC: 2.7 10*3/uL — ABNORMAL LOW (ref 4.0–10.5)

## 2017-11-27 LAB — BRAIN NATRIURETIC PEPTIDE: PRO B NATRI PEPTIDE: 50 pg/mL (ref 0.0–100.0)

## 2017-11-27 LAB — TSH: TSH: 9.42 u[IU]/mL — ABNORMAL HIGH (ref 0.35–4.50)

## 2017-11-27 LAB — VITAMIN D 25 HYDROXY (VIT D DEFICIENCY, FRACTURES): VITD: 18.41 ng/mL — ABNORMAL LOW (ref 30.00–100.00)

## 2017-11-27 LAB — HEMOGLOBIN A1C: Hgb A1c MFr Bld: 5.4 % (ref 4.6–6.5)

## 2017-11-27 NOTE — Addendum Note (Signed)
Addended by: Raford Pitcher R on: 11/27/2017 10:34 AM   Modules accepted: Orders

## 2017-11-27 NOTE — Patient Instructions (Signed)
You do not have a urine infection. We will check the labs today.  I will send a message to Dr. Delice Lesch but call their office to see if they are willing to change keppra or if they need to see you back.

## 2017-11-27 NOTE — Assessment & Plan Note (Signed)
Concern for medication reaction or levels not appropriate. Checked POC U/A today without signs of infection. Checking CMP, CBC, keppra and dilantin levels. Will ask Dr. Amparo Bristol opinion about the seizure medications if changes can be made or if the patient needs a visit with neurology. Will make changes based on lab results.

## 2017-11-27 NOTE — Assessment & Plan Note (Signed)
BP is normal today and I do not think BP medication is related to his new symptoms. Continue bidil, amiodarone, carvedilol, lasix, losartan, savaysa, spironolactone. Checking CMP and adjust as needed.

## 2017-11-27 NOTE — Assessment & Plan Note (Signed)
Concern about side effect of keppra with some daytime sedation (per wife), hallucinations. Checking keppra and dilantin levels today and route note to neurologist for their input if patient needs visit with them.

## 2017-11-27 NOTE — Progress Notes (Signed)
   Subjective:    Patient ID: Louis Dandy., male    DOB: 04-12-1946, 71 y.o.   MRN: [id number]  HPI The patient is a 71 YO man coming in for several concerns including blood pressure (readings high at home in the last several days, they do not know the numbers, denies headaches, chest pains, SOB), and hallucinations (started since starting keppra, going on about 4 weeks but worse in the last 1-2 weeks, called [place] about this within the last week and we asked that they call neurology for replacement seizure medication, they have not done that as they did not know, seeing bugs at during night or day, denies confusion, he has been spraying for bugs at night time several times per week per his wife) as well as dark smelly urine (started in the last couple of days, denies burning with urination, no fevers or chills, no stomach pain or nausea, some constipation but passing gas and last BM 1-2 days ago).   Review of Systems  Constitutional: Negative.   HENT: Negative.   Eyes: Negative.   Respiratory: Negative for cough, chest tightness and shortness of breath.   Cardiovascular: Negative for chest pain, palpitations and leg swelling.  Gastrointestinal: Positive for constipation. Negative for abdominal distention, abdominal pain, diarrhea, nausea and vomiting.  Musculoskeletal: Negative.   Skin: Negative.   Neurological: Positive for tremors. Negative for dizziness, seizures, facial asymmetry and headaches.  Psychiatric/Behavioral: Positive for behavioral problems and hallucinations. Negative for self-injury, sleep disturbance and suicidal ideas.      Objective:   Physical Exam  Constitutional: He is oriented to person, place, and time. He appears well-developed and well-nourished.  HENT:  Head: Normocephalic and atraumatic.  Eyes: EOM are normal.  Neck: Normal range of motion.  Cardiovascular: Normal rate and regular rhythm.  Pulmonary/Chest: Effort normal and breath sounds normal. No  respiratory distress. He has no wheezes. He has no rales.  Abdominal: Soft. Bowel sounds are normal. He exhibits no distension. There is no tenderness. There is no rebound.  Musculoskeletal: He exhibits no edema.  Some tremoring of hand on exam which is calmable with effort by patient, no slurring of speech or confusion  Neurological: He is alert and oriented to person, place, and time. Coordination normal.  Some lack of knowledge about timeline of events from patient and wife  Skin: Skin is warm and dry.   Vitals:   11/27/17 0839  BP: 110/82  Pulse: 81  Temp: 98 F (36.7 C)  TempSrc: Oral  SpO2: 93%  Weight: 215 lb (97.5 kg)  Height: 6' (1.829 m)      Assessment & Plan:

## 2017-11-27 NOTE — Assessment & Plan Note (Signed)
Checked U/A in the office today with some signs of dehydration but no infection. Checking other labs but no treatment today.

## 2017-11-28 ENCOUNTER — Other Ambulatory Visit: Payer: Self-pay | Admitting: Internal Medicine

## 2017-11-28 DIAGNOSIS — Z87891 Personal history of nicotine dependence: Secondary | ICD-10-CM | POA: Diagnosis not present

## 2017-11-28 DIAGNOSIS — M1712 Unilateral primary osteoarthritis, left knee: Secondary | ICD-10-CM | POA: Diagnosis not present

## 2017-11-28 DIAGNOSIS — I5022 Chronic systolic (congestive) heart failure: Secondary | ICD-10-CM | POA: Diagnosis not present

## 2017-11-28 DIAGNOSIS — M109 Gout, unspecified: Secondary | ICD-10-CM | POA: Diagnosis not present

## 2017-11-28 DIAGNOSIS — E785 Hyperlipidemia, unspecified: Secondary | ICD-10-CM | POA: Diagnosis not present

## 2017-11-28 DIAGNOSIS — I2510 Atherosclerotic heart disease of native coronary artery without angina pectoris: Secondary | ICD-10-CM | POA: Diagnosis not present

## 2017-11-28 DIAGNOSIS — Z95810 Presence of automatic (implantable) cardiac defibrillator: Secondary | ICD-10-CM | POA: Diagnosis not present

## 2017-11-28 DIAGNOSIS — M6281 Muscle weakness (generalized): Secondary | ICD-10-CM | POA: Diagnosis not present

## 2017-11-28 DIAGNOSIS — Z9181 History of falling: Secondary | ICD-10-CM | POA: Diagnosis not present

## 2017-11-28 DIAGNOSIS — D649 Anemia, unspecified: Secondary | ICD-10-CM | POA: Diagnosis not present

## 2017-11-28 DIAGNOSIS — R1310 Dysphagia, unspecified: Secondary | ICD-10-CM | POA: Diagnosis not present

## 2017-11-28 DIAGNOSIS — Z7901 Long term (current) use of anticoagulants: Secondary | ICD-10-CM | POA: Diagnosis not present

## 2017-11-28 DIAGNOSIS — I130 Hypertensive heart and chronic kidney disease with heart failure and stage 1 through stage 4 chronic kidney disease, or unspecified chronic kidney disease: Secondary | ICD-10-CM | POA: Diagnosis not present

## 2017-11-28 DIAGNOSIS — Z8673 Personal history of transient ischemic attack (TIA), and cerebral infarction without residual deficits: Secondary | ICD-10-CM | POA: Diagnosis not present

## 2017-11-28 DIAGNOSIS — G4700 Insomnia, unspecified: Secondary | ICD-10-CM | POA: Diagnosis not present

## 2017-11-28 DIAGNOSIS — N183 Chronic kidney disease, stage 3 (moderate): Secondary | ICD-10-CM | POA: Diagnosis not present

## 2017-11-28 DIAGNOSIS — I482 Chronic atrial fibrillation: Secondary | ICD-10-CM | POA: Diagnosis not present

## 2017-11-28 MED ORDER — VITAMIN D (ERGOCALCIFEROL) 1.25 MG (50000 UNIT) PO CAPS: 50000.0000 [IU] | ORAL_CAPSULE | ORAL | 0 refills | Status: DC

## 2017-11-29 ENCOUNTER — Telehealth: Payer: Self-pay | Admitting: Internal Medicine

## 2017-11-29 NOTE — Telephone Encounter (Signed)
Fine

## 2017-11-29 NOTE — Telephone Encounter (Signed)
Copied from [place] [phone]. Topic: General - Other >> Nov 29, 2017  9:07 AM Bea Graff, NT wrote: Reason for CRM: Tillie Rung with [facility] calling to get verbal orders for this pt. Extend his home PT for twice a week for 4 weeks. He had another fall a couple weeks ago. CB#: [phone]

## 2017-11-29 NOTE — Telephone Encounter (Signed)
Notified Kendra w/MD response../lmb 

## 2017-12-02 LAB — PHENYTOIN LEVEL, TOTAL: PHENYTOIN, TOTAL: 36.1 mg/L — AB (ref 10.0–20.0)

## 2017-12-02 LAB — LEVETIRACETAM LEVEL: KEPPRA (LEVETIRACETAM): 43.3 ug/mL

## 2017-12-10 ENCOUNTER — Telehealth: Payer: Self-pay | Admitting: Internal Medicine

## 2017-12-10 DIAGNOSIS — R1310 Dysphagia, unspecified: Secondary | ICD-10-CM | POA: Diagnosis not present

## 2017-12-10 DIAGNOSIS — M1712 Unilateral primary osteoarthritis, left knee: Secondary | ICD-10-CM | POA: Diagnosis not present

## 2017-12-10 DIAGNOSIS — G4700 Insomnia, unspecified: Secondary | ICD-10-CM | POA: Diagnosis not present

## 2017-12-10 DIAGNOSIS — D649 Anemia, unspecified: Secondary | ICD-10-CM | POA: Diagnosis not present

## 2017-12-10 DIAGNOSIS — I130 Hypertensive heart and chronic kidney disease with heart failure and stage 1 through stage 4 chronic kidney disease, or unspecified chronic kidney disease: Secondary | ICD-10-CM | POA: Diagnosis not present

## 2017-12-10 DIAGNOSIS — I482 Chronic atrial fibrillation: Secondary | ICD-10-CM | POA: Diagnosis not present

## 2017-12-10 DIAGNOSIS — M109 Gout, unspecified: Secondary | ICD-10-CM | POA: Diagnosis not present

## 2017-12-10 DIAGNOSIS — E785 Hyperlipidemia, unspecified: Secondary | ICD-10-CM | POA: Diagnosis not present

## 2017-12-10 DIAGNOSIS — N183 Chronic kidney disease, stage 3 (moderate): Secondary | ICD-10-CM | POA: Diagnosis not present

## 2017-12-10 DIAGNOSIS — I5022 Chronic systolic (congestive) heart failure: Secondary | ICD-10-CM | POA: Diagnosis not present

## 2017-12-10 DIAGNOSIS — I2510 Atherosclerotic heart disease of native coronary artery without angina pectoris: Secondary | ICD-10-CM | POA: Diagnosis not present

## 2017-12-10 DIAGNOSIS — M6281 Muscle weakness (generalized): Secondary | ICD-10-CM | POA: Diagnosis not present

## 2017-12-10 NOTE — Telephone Encounter (Signed)
Copied from [place] [id number]. Topic: Inquiry >> Dec 10, 2017  1:53 PM Oliver Pila B wrote: Reason for CRM: nicole from [facility] wants to inform the pt missed the visit on 12.12.18 for physical therapy per pt's request, also yesterday pt had a fall w/ no injury, pt was able to get up by himself, contact nicole if needed [phone]

## 2017-12-11 ENCOUNTER — Telehealth: Payer: Self-pay

## 2017-12-11 DIAGNOSIS — R569 Unspecified convulsions: Secondary | ICD-10-CM

## 2017-12-11 NOTE — Telephone Encounter (Signed)
Noted  

## 2017-12-11 NOTE — Progress Notes (Signed)
Can you pls call the patient/wife and see how he is doing. Is he still having hallucinations? His Dilantin level was elevated, but not sure if this was a trough level, pls have him repeat the Dilantin level. Thanks

## 2017-12-11 NOTE — Telephone Encounter (Signed)
Spoke with pt's wife who states that pt is still experiencing hallucination "and has been since all of this started".  She states that it occurs roughly every day.  He is not moving much, seems to be depressed, and dehydrated.  He has not experienced any seizures since his Keppra increase.  Advised that we would like to have dilantin level re-checked.  Will place orders, wife will bring pt either Thursday or Friday

## 2017-12-12 ENCOUNTER — Other Ambulatory Visit: Payer: Self-pay

## 2017-12-12 DIAGNOSIS — R569 Unspecified convulsions: Secondary | ICD-10-CM

## 2017-12-20 ENCOUNTER — Other Ambulatory Visit: Payer: Self-pay

## 2017-12-20 ENCOUNTER — Inpatient Hospital Stay (HOSPITAL_COMMUNITY)
Admission: EM | Admit: 2017-12-20 | Discharge: 2017-12-28 | DRG: 872 | Disposition: A | Discharge status: Home Health | Payer: Medicare Other | Attending: Family Medicine | Admitting: Family Medicine

## 2017-12-20 ENCOUNTER — Ambulatory Visit: Payer: Self-pay | Admitting: *Deleted

## 2017-12-20 ENCOUNTER — Encounter (HOSPITAL_COMMUNITY): Payer: Self-pay | Admitting: Nurse Practitioner

## 2017-12-20 DIAGNOSIS — Z8614 Personal history of Methicillin resistant Staphylococcus aureus infection: Secondary | ICD-10-CM | POA: Diagnosis not present

## 2017-12-20 DIAGNOSIS — E876 Hypokalemia: Secondary | ICD-10-CM | POA: Diagnosis not present

## 2017-12-20 DIAGNOSIS — I130 Hypertensive heart and chronic kidney disease with heart failure and stage 1 through stage 4 chronic kidney disease, or unspecified chronic kidney disease: Secondary | ICD-10-CM | POA: Diagnosis not present

## 2017-12-20 DIAGNOSIS — S79919A Unspecified injury of unspecified hip, initial encounter: Secondary | ICD-10-CM | POA: Diagnosis not present

## 2017-12-20 DIAGNOSIS — I5022 Chronic systolic (congestive) heart failure: Secondary | ICD-10-CM | POA: Diagnosis not present

## 2017-12-20 DIAGNOSIS — I110 Hypertensive heart disease with heart failure: Secondary | ICD-10-CM | POA: Diagnosis not present

## 2017-12-20 DIAGNOSIS — I480 Paroxysmal atrial fibrillation: Secondary | ICD-10-CM | POA: Diagnosis present

## 2017-12-20 DIAGNOSIS — G8911 Acute pain due to trauma: Secondary | ICD-10-CM | POA: Diagnosis not present

## 2017-12-20 DIAGNOSIS — Z9181 History of falling: Secondary | ICD-10-CM

## 2017-12-20 DIAGNOSIS — I959 Hypotension, unspecified: Secondary | ICD-10-CM | POA: Diagnosis not present

## 2017-12-20 DIAGNOSIS — I481 Persistent atrial fibrillation: Secondary | ICD-10-CM | POA: Diagnosis present

## 2017-12-20 DIAGNOSIS — R5381 Other malaise: Secondary | ICD-10-CM | POA: Diagnosis not present

## 2017-12-20 DIAGNOSIS — R531 Weakness: Secondary | ICD-10-CM | POA: Diagnosis not present

## 2017-12-20 DIAGNOSIS — A4153 Sepsis due to Serratia: Secondary | ICD-10-CM | POA: Diagnosis not present

## 2017-12-20 DIAGNOSIS — G40909 Epilepsy, unspecified, not intractable, without status epilepticus: Secondary | ICD-10-CM | POA: Diagnosis present

## 2017-12-20 DIAGNOSIS — I2510 Atherosclerotic heart disease of native coronary artery without angina pectoris: Secondary | ICD-10-CM | POA: Diagnosis present

## 2017-12-20 DIAGNOSIS — E785 Hyperlipidemia, unspecified: Secondary | ICD-10-CM | POA: Diagnosis not present

## 2017-12-20 DIAGNOSIS — Z8249 Family history of ischemic heart disease and other diseases of the circulatory system: Secondary | ICD-10-CM

## 2017-12-20 DIAGNOSIS — Z95810 Presence of automatic (implantable) cardiac defibrillator: Secondary | ICD-10-CM

## 2017-12-20 DIAGNOSIS — G8929 Other chronic pain: Secondary | ICD-10-CM | POA: Diagnosis present

## 2017-12-20 DIAGNOSIS — N390 Urinary tract infection, site not specified: Secondary | ICD-10-CM | POA: Diagnosis present

## 2017-12-20 DIAGNOSIS — Z7901 Long term (current) use of anticoagulants: Secondary | ICD-10-CM

## 2017-12-20 DIAGNOSIS — N183 Chronic kidney disease, stage 3 (moderate): Secondary | ICD-10-CM | POA: Diagnosis not present

## 2017-12-20 DIAGNOSIS — I4892 Unspecified atrial flutter: Secondary | ICD-10-CM | POA: Diagnosis present

## 2017-12-20 DIAGNOSIS — Z87891 Personal history of nicotine dependence: Secondary | ICD-10-CM | POA: Diagnosis not present

## 2017-12-20 DIAGNOSIS — I255 Ischemic cardiomyopathy: Secondary | ICD-10-CM | POA: Diagnosis not present

## 2017-12-20 DIAGNOSIS — Z951 Presence of aortocoronary bypass graft: Secondary | ICD-10-CM

## 2017-12-20 DIAGNOSIS — Z8673 Personal history of transient ischemic attack (TIA), and cerebral infarction without residual deficits: Secondary | ICD-10-CM | POA: Diagnosis not present

## 2017-12-20 DIAGNOSIS — I482 Chronic atrial fibrillation: Secondary | ICD-10-CM | POA: Diagnosis not present

## 2017-12-20 DIAGNOSIS — I10 Essential (primary) hypertension: Secondary | ICD-10-CM | POA: Diagnosis not present

## 2017-12-20 DIAGNOSIS — D682 Hereditary deficiency of other clotting factors: Secondary | ICD-10-CM | POA: Diagnosis present

## 2017-12-20 DIAGNOSIS — Z86718 Personal history of other venous thrombosis and embolism: Secondary | ICD-10-CM

## 2017-12-20 DIAGNOSIS — I4819 Other persistent atrial fibrillation: Secondary | ICD-10-CM | POA: Diagnosis not present

## 2017-12-20 DIAGNOSIS — A419 Sepsis, unspecified organism: Secondary | ICD-10-CM | POA: Diagnosis not present

## 2017-12-20 DIAGNOSIS — I4891 Unspecified atrial fibrillation: Secondary | ICD-10-CM | POA: Diagnosis not present

## 2017-12-20 DIAGNOSIS — N179 Acute kidney failure, unspecified: Secondary | ICD-10-CM | POA: Diagnosis present

## 2017-12-20 DIAGNOSIS — M1712 Unilateral primary osteoarthritis, left knee: Secondary | ICD-10-CM | POA: Diagnosis present

## 2017-12-20 DIAGNOSIS — R9431 Abnormal electrocardiogram [ECG] [EKG]: Secondary | ICD-10-CM | POA: Diagnosis not present

## 2017-12-20 HISTORY — DX: Other persistent atrial fibrillation: I48.19

## 2017-12-20 LAB — CBC WITH DIFFERENTIAL/PLATELET
Basophils Absolute: 0 10*3/uL (ref 0.0–0.1)
Basophils Relative: 0 %
EOS PCT: 0 %
Eosinophils Absolute: 0 10*3/uL (ref 0.0–0.7)
HCT: 47.3 % (ref 39.0–52.0)
Hemoglobin: 16.5 g/dL (ref 13.0–17.0)
LYMPHS PCT: 3 %
Lymphs Abs: 0.5 10*3/uL — ABNORMAL LOW (ref 0.7–4.0)
MCH: 31.7 pg (ref 26.0–34.0)
MCHC: 34.9 g/dL (ref 30.0–36.0)
MCV: 90.8 fL (ref 78.0–100.0)
MONO ABS: 1.9 10*3/uL — AB (ref 0.1–1.0)
MONOS PCT: 13 %
Neutro Abs: 12.8 10*3/uL — ABNORMAL HIGH (ref 1.7–7.7)
Neutrophils Relative %: 84 %
PLATELETS: 278 10*3/uL (ref 150–400)
RBC: 5.21 MIL/uL (ref 4.22–5.81)
RDW: 13.8 % (ref 11.5–15.5)
WBC: 15.2 10*3/uL — AB (ref 4.0–10.5)

## 2017-12-20 LAB — URINALYSIS, ROUTINE W REFLEX MICROSCOPIC
Bilirubin Urine: NEGATIVE
Glucose, UA: NEGATIVE mg/dL
Ketones, ur: 5 mg/dL — AB
Nitrite: POSITIVE — AB
PROTEIN: 100 mg/dL — AB
Specific Gravity, Urine: 1.013 (ref 1.005–1.030)
pH: 5 (ref 5.0–8.0)

## 2017-12-20 LAB — COMPREHENSIVE METABOLIC PANEL
ALBUMIN: 3.8 g/dL (ref 3.5–5.0)
ALT: 15 U/L — AB (ref 17–63)
AST: 26 U/L (ref 15–41)
Alkaline Phosphatase: 118 U/L (ref 38–126)
Anion gap: 11 (ref 5–15)
BUN: 36 mg/dL — AB (ref 6–20)
CHLORIDE: 96 mmol/L — AB (ref 101–111)
CO2: 25 mmol/L (ref 22–32)
CREATININE: 1.64 mg/dL — AB (ref 0.61–1.24)
Calcium: 9.2 mg/dL (ref 8.9–10.3)
GFR calc Af Amer: 48 mL/min — ABNORMAL LOW (ref >60)
GFR, EST NON AFRICAN AMERICAN: 41 mL/min — AB (ref >60)
GLUCOSE: 83 mg/dL (ref 65–99)
Potassium: 4.3 mmol/L (ref 3.5–5.1)
Sodium: 132 mmol/L — ABNORMAL LOW (ref 135–145)
Total Bilirubin: 1.6 mg/dL — ABNORMAL HIGH (ref 0.3–1.2)
Total Protein: 9 g/dL — ABNORMAL HIGH (ref 6.5–8.1)

## 2017-12-20 LAB — MRSA PCR SCREENING: MRSA by PCR: NEGATIVE

## 2017-12-20 LAB — PHENYTOIN LEVEL, TOTAL: PHENYTOIN LVL: 28.2 ug/mL — AB (ref 10.0–20.0)

## 2017-12-20 MED ORDER — DULOXETINE HCL 30 MG PO CPEP
30.0000 mg | ORAL_CAPSULE | Freq: Every day | ORAL | Status: DC
  Administered 2017-12-21 – 2017-12-28 (×8): 30 mg via ORAL
  Filled 2017-12-20 (×8): qty 1

## 2017-12-20 MED ORDER — SENNOSIDES-DOCUSATE SODIUM 8.6-50 MG PO TABS
2.0000 | ORAL_TABLET | Freq: Two times a day (BID) | ORAL | Status: DC
  Administered 2017-12-21 – 2017-12-28 (×14): 2 via ORAL
  Filled 2017-12-20 (×14): qty 2

## 2017-12-20 MED ORDER — ONDANSETRON HCL 4 MG/2ML IJ SOLN: 4.0000 mg | Freq: Four times a day (QID) | INTRAMUSCULAR | Status: DC | PRN

## 2017-12-20 MED ORDER — DEXTROSE 5 % IV SOLN: 1.0000 g | Freq: Once | INTRAVENOUS | Status: DC

## 2017-12-20 MED ORDER — SODIUM CHLORIDE 0.9 % IV BOLUS (SEPSIS)
2000.0000 mL | Freq: Once | INTRAVENOUS | Status: AC
  Administered 2017-12-20: 2000 mL via INTRAVENOUS

## 2017-12-20 MED ORDER — ACETAMINOPHEN 325 MG PO TABS
650.0000 mg | ORAL_TABLET | Freq: Four times a day (QID) | ORAL | Status: DC | PRN
  Administered 2017-12-23 – 2017-12-26 (×2): 650 mg via ORAL
  Filled 2017-12-20 (×4): qty 2

## 2017-12-20 MED ORDER — SODIUM CHLORIDE 0.9 % IV SOLN
INTRAVENOUS | Status: DC
  Administered 2017-12-20 – 2017-12-26 (×9): via INTRAVENOUS

## 2017-12-20 MED ORDER — LEVETIRACETAM 500 MG PO TABS
1000.0000 mg | ORAL_TABLET | Freq: Once | ORAL | Status: AC
  Administered 2017-12-20: 1000 mg via ORAL
  Filled 2017-12-20: qty 2

## 2017-12-20 MED ORDER — DEXTROSE 5 % IV SOLN
1.0000 g | INTRAVENOUS | Status: DC
  Administered 2017-12-21 – 2017-12-26 (×6): 1 g via INTRAVENOUS
  Filled 2017-12-20 (×6): qty 10

## 2017-12-20 MED ORDER — DEXTROSE 5 % IV SOLN
2.0000 g | Freq: Once | INTRAVENOUS | Status: AC
  Administered 2017-12-20: 2 g via INTRAVENOUS
  Filled 2017-12-20: qty 2

## 2017-12-20 MED ORDER — EDOXABAN TOSYLATE 30 MG PO TABS
60.0000 mg | ORAL_TABLET | Freq: Every day | ORAL | Status: DC
  Administered 2017-12-20 – 2017-12-28 (×9): 60 mg via ORAL
  Filled 2017-12-20 (×9): qty 2

## 2017-12-20 MED ORDER — ONDANSETRON HCL 4 MG PO TABS: 4.0000 mg | ORAL_TABLET | Freq: Four times a day (QID) | ORAL | Status: DC | PRN

## 2017-12-20 MED ORDER — FERROUS SULFATE 325 (65 FE) MG PO TABS
325.0000 mg | ORAL_TABLET | Freq: Two times a day (BID) | ORAL | Status: DC
  Administered 2017-12-21 – 2017-12-28 (×15): 325 mg via ORAL
  Filled 2017-12-20 (×15): qty 1

## 2017-12-20 MED ORDER — LEVETIRACETAM 500 MG PO TABS
1000.0000 mg | ORAL_TABLET | Freq: Two times a day (BID) | ORAL | Status: DC
  Administered 2017-12-21 – 2017-12-28 (×15): 1000 mg via ORAL
  Filled 2017-12-20 (×15): qty 2

## 2017-12-20 MED ORDER — LEVETIRACETAM 500 MG PO TABS: 1000.0000 mg | ORAL_TABLET | Freq: Once | ORAL | Status: DC

## 2017-12-20 MED ORDER — AMIODARONE HCL 200 MG PO TABS
200.0000 mg | ORAL_TABLET | Freq: Every day | ORAL | Status: DC
  Administered 2017-12-20 – 2017-12-28 (×9): 200 mg via ORAL
  Filled 2017-12-20 (×9): qty 1

## 2017-12-20 MED ORDER — CARVEDILOL 3.125 MG PO TABS: 3.1250 mg | ORAL_TABLET | Freq: Two times a day (BID) | ORAL | Status: DC

## 2017-12-20 MED ORDER — PHENYTOIN SODIUM EXTENDED 100 MG PO CAPS
300.0000 mg | ORAL_CAPSULE | Freq: Every day | ORAL | Status: DC
  Administered 2017-12-21 – 2017-12-27 (×7): 300 mg via ORAL
  Filled 2017-12-20 (×7): qty 3

## 2017-12-20 MED ORDER — DILTIAZEM HCL-DEXTROSE 100-5 MG/100ML-% IV SOLN (PREMIX)
5.0000 mg/h | INTRAVENOUS | Status: DC
  Administered 2017-12-20: 5 mg/h via INTRAVENOUS
  Filled 2017-12-20: qty 100

## 2017-12-20 MED ORDER — CARVEDILOL 3.125 MG PO TABS
3.1250 mg | ORAL_TABLET | Freq: Two times a day (BID) | ORAL | Status: DC
  Administered 2017-12-20 – 2017-12-21 (×3): 3.125 mg via ORAL
  Filled 2017-12-20 (×3): qty 1

## 2017-12-20 MED ORDER — SIMVASTATIN 20 MG PO TABS
20.0000 mg | ORAL_TABLET | Freq: Every day | ORAL | Status: DC
  Administered 2017-12-21 – 2017-12-28 (×7): 20 mg via ORAL
  Filled 2017-12-20: qty 2
  Filled 2017-12-20 (×2): qty 1
  Filled 2017-12-20 (×2): qty 2
  Filled 2017-12-20: qty 1
  Filled 2017-12-20: qty 2
  Filled 2017-12-20: qty 1

## 2017-12-20 NOTE — ED Notes (Signed)
Stuck patient x2, unsuccessful x2

## 2017-12-20 NOTE — ED Notes (Signed)
Main phlebetomy called to obtain labs, MD [place] informed of no lab work and states go ahead and start IV abx.

## 2017-12-20 NOTE — ED Notes (Signed)
Bed: WA07 Expected date:  Expected time:  Means of arrival:  Comments: Weakness, fall this morning

## 2017-12-20 NOTE — ED Notes (Signed)
ED TO INPATIENT HANDOFF REPORT  Name/Age/Gender [facility]. 71 y.o. male  Code Status Code Status History    Date Active Date Inactive Code Status Order ID Comments User Context   09/09/2017 00:27 09/10/2017 20:35 DNR [id number]  Merton Border, MD Inpatient   02/19/2017 20:26 02/25/2017 21:22 DNR [id number]  Cristal Ford, DO Inpatient   02/27/2016 00:19 02/29/2016 17:29 DNR [id number]  Allie Bossier, MD ED   02/26/2016 23:38 02/27/2016 00:19 Full Code [id number]  Allie Bossier, MD ED   10/12/2015 10:05 10/20/2015 17:35 Full Code [id number]  [name], MD Inpatient   10/04/2015 15:31 10/12/2015 10:05 Full Code [id number]  Burnell Blanks, MD Inpatient   04/20/2014 17:32 04/22/2014 19:55 Full Code [id number]  Thompson Grayer, MD ED   09/21/2012 12:53 10/10/2012 22:41 Full Code [id number]  Carlynn Spry, RN ED    Questions for Most Recent Historical Code Status (Order [id number])    Question Answer Comment   In the event of cardiac or respiratory ARREST Do not call a "code blue"    In the event of cardiac or respiratory ARREST Do not perform Intubation, CPR, defibrillation or ACLS    In the event of cardiac or respiratory ARREST Use medication by any route, position, wound care, and other measures to relive pain and suffering. May use oxygen, suction and manual treatment of airway obstruction as needed for comfort.         Advance Directive Documentation     Most Recent Value  Type of Advance Directive  Healthcare Power of Attorney, Living will  Pre-existing out of facility DNR order (yellow form or pink MOST form)  No data  "MOST" Form in Place?  No data      Home/SNF/Other Home  Chief Complaint fall weakness  Level of Care/Admitting Diagnosis ED Disposition    ED Disposition Condition Comment   Admit  Hospital Area: [place] [[id number]]  Level of Care: Stepdown [14]  Admit to SDU based on following criteria: Cardiac Instability:  Patients  experiencing chest pain, unconfirmed MI and stable, arrhythmias and CHF requiring medical management and potentially compromising patient's stability  Diagnosis: UTI (urinary tract infection) [549826]  Admitting Physician: LAMA, [place]  Attending Physician: Oswald Hillock [[id number]]  Estimated length of stay: 3 - 4 days  Certification:: I certify this patient will need inpatient services for at least 2 midnights  PT Class (Do Not Modify): Inpatient [101]  PT Acc Code (Do Not Modify): Private [1]       Medical History Past Medical History:  Diagnosis Date  . AICD (automatic cardioverter/defibrillator) present   . Atrial fibrillation   09/22/2012  . Blood loss anemia 04/18/2017   After GI bleed from colonoscopy and polypectomy  . CAD (coronary artery disease)   . Chronic systolic heart failure ([place])   . 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 GIB (gastrointestinal bleeding) 02/20/2017  . History of MRSA infection 05/2011  . HTN (hypertension)   . Hx of adenomatous colonic polyps 02/20/2017   01/2017 - 3 cm sigmoid TV adenoma and other smaller polyps - had post-polypectomy bleed Tx w/ clips Consider repeat colonoscopy 3 yrs [name], MD, [name]   . Hyperlipidemia   . implantable cardiac defibrillator-Biotronik    Device Implanted 2006; s/p gen change 03/2011 : bleeding persistent with pocket erosion and infection; explant and reimplant  06/2011  . Ischemic cardiomyopathy    EF  15 to 20% by TTE and TEE in 09/2012.  Severe LV dysfunction  . Obesity    BMI 31 in 09/2012  . Seizure disorder ([place]) latest 09/30/2012  . Seizures ([place])   . Stroke [facility])     Allergies No Known Allergies  IV Location/Drains/Wounds Patient Lines/Drains/Airways Status   Active Line/Drains/Airways    Name:   Placement date:   Placement time:   Site:   Days:   Peripheral IV 12/20/17 Left;Anterior;Upper Arm   12/20/17    1446    Arm   less than 1           Labs/Imaging Results for orders placed or performed during the hospital encounter of 12/20/17 (from the past 48 hour(s))  Urinalysis, Routine w reflex microscopic     Status: Abnormal   Collection Time: 12/20/17  1:30 PM  Result Value Ref Range   Color, Urine YELLOW YELLOW   APPearance TURBID (A) CLEAR   Specific Gravity, Urine 1.013 1.005 - 1.030   pH 5.0 5.0 - 8.0   Glucose, UA NEGATIVE NEGATIVE mg/dL   Hgb urine dipstick LARGE (A) NEGATIVE   Bilirubin Urine NEGATIVE NEGATIVE   Ketones, ur 5 (A) NEGATIVE mg/dL   Protein, ur 100 (A) NEGATIVE mg/dL   Nitrite POSITIVE (A) NEGATIVE   Leukocytes, UA LARGE (A) NEGATIVE   RBC / HPF TOO NUMEROUS TO COUNT 0 - 5 RBC/hpf   WBC, UA TOO NUMEROUS TO COUNT 0 - 5 WBC/hpf   Bacteria, UA MANY (A) NONE SEEN   Squamous Epithelial / LPF 0-5 (A) NONE SEEN   WBC Clumps PRESENT    Mucus PRESENT    Hyaline Casts, UA PRESENT   Comprehensive metabolic panel     Status: Abnormal   Collection Time: 12/20/17  2:50 PM  Result Value Ref Range   Sodium 132 (L) 135 - 145 mmol/L   Potassium 4.3 3.5 - 5.1 mmol/L   Chloride 96 (L) 101 - 111 mmol/L   CO2 25 22 - 32 mmol/L   Glucose, Bld 83 65 - 99 mg/dL   BUN 36 (H) 6 - 20 mg/dL   Creatinine, Ser 1.64 (H) 0.61 - 1.24 mg/dL   Calcium 9.2 8.9 - 10.3 mg/dL   Total Protein 9.0 (H) 6.5 - 8.1 g/dL   Albumin 3.8 3.5 - 5.0 g/dL   AST 26 15 - 41 U/L   ALT 15 (L) 17 - 63 U/L   Alkaline Phosphatase 118 38 - 126 U/L   Total Bilirubin 1.6 (H) 0.3 - 1.2 mg/dL   GFR calc non Af Amer 41 (L) >60 mL/min   GFR calc Af Amer 48 (L) >60 mL/min    Comment: (NOTE) The eGFR has been calculated using the CKD EPI equation. This calculation has not been validated in all clinical situations. eGFR's persistently <60 mL/min signify possible Chronic Kidney Disease.    Anion gap 11 5 - 15  CBC with Differential/Platelet     Status: Abnormal   Collection Time: 12/20/17  2:50 PM  Result Value Ref Range   WBC 15.2 (H) 4.0 - 10.5  K/uL   RBC 5.21 4.22 - 5.81 MIL/uL   Hemoglobin 16.5 13.0 - 17.0 g/dL   HCT 47.3 39.0 - 52.0 %   MCV 90.8 78.0 - 100.0 fL   MCH 31.7 26.0 - 34.0 pg   MCHC 34.9 30.0 - 36.0 g/dL   RDW 13.8 11.5 - 15.5 %   Platelets 278 150 - 400 K/uL  Neutrophils Relative % 84 %   Neutro Abs 12.8 (H) 1.7 - 7.7 K/uL   Lymphocytes Relative 3 %   Lymphs Abs 0.5 (L) 0.7 - 4.0 K/uL   Monocytes Relative 13 %   Monocytes Absolute 1.9 (H) 0.1 - 1.0 K/uL   Eosinophils Relative 0 %   Eosinophils Absolute 0.0 0.0 - 0.7 K/uL   Basophils Relative 0 %   Basophils Absolute 0.0 0.0 - 0.1 K/uL   No results found.  Pending Labs Unresulted Labs (From admission, onward)   Start     Ordered   12/20/17 1614  Blood Culture (routine x 2)  BLOOD CULTURE X 2,   STAT     12/20/17 1615   12/20/17 1532  Phenytoin level, total  Once,   STAT    Question:  Specimen collection method  Answer:  IV Team=IV Team collect   12/20/17 1532   12/20/17 1514  Urine Culture  STAT,   STAT     12/20/17 1513   Signed and Held  CBC  Tomorrow morning,   R    Question:  Specimen collection method  Answer:  IV Team=IV Team collect   Signed and Held   Signed and Held  Comprehensive metabolic panel  Tomorrow morning,   R    Question:  Specimen collection method  Answer:  IV Team=IV Team collect   Signed and Held      Vitals/Pain Today's Vitals   12/20/17 1400 12/20/17 1531 12/20/17 1600 12/20/17 1700  BP: 133/81 113/76 96/63 121/69  Pulse:  (!) 124 (!) 120 (!) 131  Resp:  (!) 22 (!) 32 15  Temp:      TempSrc:      SpO2:  96% 95%   Weight:      Height:        Isolation Precautions No active isolations  Medications Medications  levETIRAcetam (KEPPRA) tablet 1,000 mg (not administered)  cefTRIAXone (ROCEPHIN) 2 g in dextrose 5 % 50 mL IVPB (not administered)  sodium chloride 0.9 % bolus 2,000 mL (2,000 mLs Intravenous New Bag/Given 12/20/17 1622)    Mobility walks

## 2017-12-20 NOTE — Telephone Encounter (Signed)
Pt's wife, Mardene Celeste, called and said pt is on the bathroom floor and that his urine has been brown for the past week; she feels like he is passing out, and that he has been falling all the time and has not been out of bed since Tuesday; per nurse triage initiated and recommendation made for pt's wife to activate EMS activated; she requested that the triage nurse call for her; EMS activated; spoke with operator who dispatched personnel to scene; will route to [facility] pool to notify them of this encounter.  Reason for Disposition . Difficult to awaken or acting confused (e.g., disoriented, slurred speech)  Answer Assessment - Initial Assessment Questions 1. ONSET: "How long were you unconscious?" (minutes) "When did it happen?"     minutes 2. CONTENT: "What happened during period of unconsciousness?" (e.g., seizure activity)      Unsure; pt has a history of seizuers 3. MENTAL STATUS: "Alert and oriented now?" (oriented x 3 = name, month, location)      Alert to person 4. TRIGGER: "What do you think caused the fainting?" "What were you doing just before you fainted?"  (e.g., exercise, sudden standing up, prolonged standing)     Pt was unattended in bathroom 5. RECURRENT SYMPTOM: "Have you ever passed out before?" If so, ask: "When was the last time?" and "What happened that time?"      Pt has been falling at home 6. INJURY: "Did you sustain any injury during the fall?"      unsure 7. CARDIAC SYMPTOMS: "Have you had any of the following symptoms: chest pain, difficulty breathing, palpitations?"     unsure 8. NEUROLOGIC SYMPTOMS: "Have you had any of the following symptoms: headache, numbness, vertigo, weakness?"     weakness 9. GI SYMPTOMS: "Have you had any of the following symptoms: abdominal pain, vomiting, diarrhea, blood in stools?"     unsure 10. OTHER SYMPTOMS: "Do you have any other symptoms?"       Brown urine 11. PREGNANCY: "Is there any chance you are pregnant?" "When was your last  menstrual period?"    n/a  Protocols used: [facility]

## 2017-12-20 NOTE — Care Management Note (Signed)
Case Management Note  Patient Details  Name: Louis Ford. MRN: [id number] Date of Birth: [birth year]  CM noted pt was active with [facility].  Confirmed services with [facility].  Will follow for additional needs.  Expected Discharge Date:    Unknown              Expected Discharge Plan:  [place] Choice:  Home Health Choice offered to:  Patient, Spouse  HH Arranged:  PT [place] Agency:  [facility]  Status of Service:  In process, will continue to follow  Rae Mar, RN 12/20/2017, 12:53 PM

## 2017-12-20 NOTE — H&P (Signed)
TRH H&P    Patient Demographics:    Louis Ford, is a 71 y.o. male  MRN: [id number]  DOB - 02/25/1946  Admit Date - 12/20/2017  Referring MD/NP/PA: Dr Winfred Leeds  Outpatient Primary MD for the patient is [name], MD  Patient coming from: home   No chief complaint on file.     HPI:    Louis Ford  is a 71 y.o. male, with history of ischemic cardiomyopathy status post AICD, EF 84 - 55%, as per echo from 4/18, chronic atrial fibrillation, stroke, seizure disorder, hypertension came to ED after patient fell in the bathroom. Patient denies passing out. As per patient he tripped in the bathroom. He denies chest pain or shortness of breath. No nausea vomiting or diarrhea. He does have a history of seizures. And is on Keppra and phenytoin at home.  In the ED, patient became hypotensive. With systolic blood pressure dropped to 90s. UA was abnormal with positive nitrite, too numerous to count WBC. Patient empirically started on ceftriaxone for UTI.  Also patient became tachycardic with EKG showing A. fib with RVR.  Denies dysuria, urgency or frequency of urination.     Review of systems:      All other systems reviewed and are negative.   With Past History of the following :    Past Medical History:  Diagnosis Date  . AICD (automatic cardioverter/defibrillator) present   . Atrial fibrillation   09/22/2012  . Blood loss anemia 04/18/2017   After GI bleed from colonoscopy and polypectomy  . CAD (coronary artery disease)   . Chronic systolic heart failure ([place])   . 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 GIB (gastrointestinal bleeding) 02/20/2017  . History of MRSA infection 05/2011  . HTN (hypertension)   . Hx of adenomatous colonic polyps 02/20/2017   01/2017 - 3 cm sigmoid TV adenoma and other smaller polyps - had post-polypectomy bleed Tx w/  clips Consider repeat colonoscopy 3 yrs [name], MD, [name]   . Hyperlipidemia   . implantable cardiac defibrillator-Biotronik    Device Implanted 2006; s/p gen change 03/2011 : bleeding persistent with pocket erosion and infection; explant and reimplant  06/2011  . Ischemic cardiomyopathy    EF 15 to 20% by TTE and TEE in 09/2012.  Severe LV dysfunction  . Obesity    BMI 31 in 09/2012  . Seizure disorder ([place]) latest 09/30/2012  . Seizures ([place])   . Stroke [facility])       Past Surgical History:  Procedure Laterality Date  . CARDIAC CATHETERIZATION N/A 10/12/2015   Procedure: Right Heart Cath;  Surgeon: [name], MD;  Location: [address] CV LAB;  Service: Cardiovascular;  Laterality: N/A;  . CARDIOVERSION  09/24/2012   Procedure: CARDIOVERSION;  Surgeon: [name], MD;  Location: [place];  Service: Cardiovascular;  Laterality: N/A;  . COLONOSCOPY W/ POLYPECTOMY  02/13/2017  . CORONARY ARTERY BYPASS GRAFT    . FLEXIBLE SIGMOIDOSCOPY N/A 02/20/2017   Procedure: FLEXIBLE  SIGMOIDOSCOPY;  Surgeon: [name], MD;  Location: [facility] ENDOSCOPY;  Service: Endoscopy;  Laterality: N/A;  . FLEXIBLE SIGMOIDOSCOPY N/A 02/21/2017   Procedure: FLEXIBLE SIGMOIDOSCOPY;  Surgeon: [name], MD;  Location[facility] ENDOSCOPY;  Service: Endoscopy;  Laterality: N/A;  . ICD    . TEE WITHOUT CARDIOVERSION  09/24/2012   Procedure: TRANSESOPHAGEAL ECHOCARDIOGRAM (TEE);  Surgeon: [name], MD;  Location: [facility] ENDOSCOPY;  Service: Cardiovascular;  Laterality: N/A;  dave/anesth, dl, cindy/echo       Social History:      Social History   Tobacco Use  . Smoking status: Former Smoker    Types: Pipe    Last attempt to quit: 09/21/2008    Years since quitting: 9.2  . Smokeless tobacco: Former Systems developer    Quit date: 09/21/2008  Substance Use Topics  . Alcohol use: No       Family History :     Family History  Problem Relation Age of Onset  . Arthritis Mother   . Heart disease Mother    . Heart attack Mother   . Other Father        smoker  . Hypertension Neg Hx        unknown  . Stroke Neg Hx        unknown      Home Medications:   Prior to Admission medications   Medication Sig Start Date End Date Taking? Authorizing Provider  acetaminophen (TYLENOL) 325 MG tablet Take 650 mg by mouth every 6 (six) hours as needed for mild pain.   Yes [provider]  amiodarone (PACERONE) 200 MG tablet Take 1 tablet (200 mg total) by mouth daily. 08/24/16  Yes [name], MD  BIDIL 20-37.5 MG tablet TAKE TWO TABLETS BY MOUTH THREE TIMES DAILY 07/03/17  Yes [name], [name], MD  carvedilol (COREG) 3.125 MG tablet Take 1 tablet (3.125 mg total) by mouth 2 (two) times daily. 02/04/17  Yes [name], [name], MD  DULoxetine (CYMBALTA) 30 MG capsule Take 1 capsule (30 mg total) by mouth daily. 09/19/17  Yes [name], MD  ferrous sulfate 325 (65 FE) MG tablet Take 1 tablet (325 mg total) 2 (two) times daily with a meal by mouth. 11/07/17  Yes [name], MD  furosemide (LASIX) 40 MG tablet TAKE ONE TABLET BY MOUTH ONCE DAILY Patient taking differently: TAKE 40mg  BY MOUTH ONCE DAILY 02/27/17  Yes [name], [name], MD  levETIRAcetam (KEPPRA) 1000 MG tablet Take 1 tablet (1,000 mg total) by mouth 2 (two) times daily. 10/07/17  Yes [name], MD  losartan (COZAAR) 25 MG tablet TAKE ONE TABLET BY MOUTH ONCE DAILY Patient taking differently: TAKE 25mg  BY MOUTH ONCE DAILY 03/27/17  Yes [name], [name], MD  nystatin (MYCOSTATIN) 100000 UNIT/ML suspension SWISH, GARGLE AND SPIT OUT 5-10 MILLILITERS EVERY 6 HOURS AS NEEDED 09/19/17  Yes [name], MD  phenytoin (DILANTIN) 300 MG ER capsule Take 1 capsule (300 mg total) by mouth at bedtime. 10/07/17  Yes [name], MD  SAVAYSA 60 MG TABS tablet TAKE 1 TABLET BY MOUTH ONCE DAILY Patient taking differently: TAKE 60mg  BY MOUTH ONCE DAILY 08/30/17  Yes [name], [name], MD    senna-docusate (SENOKOT-S) 8.6-50 MG tablet Take 2 tablets by mouth 2 (two) times daily. 02/25/17  Yes Hosie Poisson, MD  simvastatin (ZOCOR) 20 MG tablet Take 1 tablet (20 mg total) by mouth daily at 6 PM. Overdue for annual appt  w/labs must make appt for future refills 07/16/17  Yes Biagio Borg, MD  spironolactone (ALDACTONE) 25 MG tablet TAKE ONE-HALF TABLET BY MOUTH ONCE DAILY Patient taking differently: TAKE 12.5mg  TABLET BY MOUTH ONCE DAILY 03/27/17  Yes Shirley Friar, PA-C  tamsulosin [facility]) 0.4 MG CAPS capsule Take 1 capsule (0.4 mg total) daily by mouth. 11/07/17  Yes [name], MD  Vitamin D, Ergocalciferol, (DRISDOL) 50000 units CAPS capsule Take 1 capsule (50,000 Units total) by mouth every 7 (seven) days. 11/28/17  Yes [name], MD  diclofenac sodium (VOLTAREN) 1 % GEL Apply 4 g topically 4 (four) times daily as needed. Patient not taking: Reported on 12/20/2017 07/16/17   Biagio Borg, MD  polyethylene glycol [facility] Floria Raveling) packet Take 17 g by mouth daily as needed. Patient not taking: Reported on 12/20/2017 02/25/17   Hosie Poisson, MD  triamcinolone cream (KENALOG) 0.1 % Apply 1 application 2 (two) times daily topically. Patient not taking: Reported on 12/20/2017 11/07/17 11/07/18  [name], MD     Allergies:    No Known Allergies   Physical Exam:   Vitals  Blood pressure 113/76, pulse (!) 124, temperature 99.4 F (37.4 C), temperature source Oral, resp. rate (!) 22, height 5\' 9"  (1.753 m), weight 99.8 kg (220 lb), SpO2 96 %.  1.  General: Appears in no acute distress  2. Psychiatric:  Intact judgement and  insight, awake alert, oriented x 3.  3. Neurologic: No focal neurological deficits, all cranial nerves intact.Strength 5/5 all 4 extremities, sensation intact all 4 extremities, plantars down going.  4. Eyes :  anicteric sclerae, moist conjunctivae with no lid lag. PERRLA.  5. ENMT:  Oropharynx clear with  moist mucous membranes and good dentition  6. Neck:  supple, no cervical lymphadenopathy appriciated, No thyromegaly  7. Respiratory : Normal respiratory effort, good air movement bilaterally,clear to  auscultation bilaterally  8. Cardiovascular : RRR, no gallops, rubs or murmurs, no leg edema  9. Gastrointestinal:  Positive bowel sounds, abdomen soft, non-tender to palpation,no hepatosplenomegaly, no rigidity or guarding       10. Skin:  No cyanosis, normal texture and turgor, no rash, lesions or ulcers  11.Musculoskeletal:  Good muscle tone,  joints appear normal , no effusions,  normal range of motion    Data Review:    CBC Recent Labs  Lab 12/20/17 1450  WBC 15.2*  HGB 16.5  HCT 47.3  PLT 278  MCV 90.8  MCH 31.7  MCHC 34.9  RDW 13.8  LYMPHSABS 0.5*  MONOABS 1.9*  EOSABS 0.0  BASOSABS 0.0   ------------------------------------------------------------------------------------------------------------------  Chemistries  Recent Labs  Lab 12/20/17 1450  NA 132*  K 4.3  CL 96*  CO2 25  GLUCOSE 83  BUN 36*  CREATININE 1.64*  CALCIUM 9.2  AST 26  ALT 15*  ALKPHOS 118  BILITOT 1.6*   ------------------------------------------------------------------------------------------------------------------  ------------------------------------------------------------------------------------------------------------------ GFR: Estimated Creatinine Clearance: 49.5 mL/min (A) (by C-G formula based on SCr of 1.64 mg/dL (H)). Liver Function Tests: Recent Labs  Lab 12/20/17 1450  AST 26  ALT 15*  ALKPHOS 118  BILITOT 1.6*  PROT 9.0*  ALBUMIN 3.8   No results for input(s): LIPASE, AMYLASE in the last 168 hours. No results for input(s): AMMONIA in the last 168 hours. Coagulation Profile: No results for input(s): INR, PROTIME in the last 168 hours. Cardiac Enzymes: No results for input(s): CKTOTAL, CKMB, CKMBINDEX, TROPONINI in the last 168 hours. BNP (last 3  results) Recent Labs    11/27/17 0932  PROBNP 50.0    --------------------------------------------------------------------------------------------------------------- Urine analysis:    Component Value Date/Time   COLORURINE YELLOW 12/20/2017 1330   APPEARANCEUR TURBID (A) 12/20/2017 1330   LABSPEC 1.013 12/20/2017 1330   PHURINE 5.0 12/20/2017 1330   GLUCOSEU NEGATIVE 12/20/2017 1330   HGBUR LARGE (A) 12/20/2017 1330   BILIRUBINUR NEGATIVE 12/20/2017 1330   BILIRUBINUR Neg 11/27/2017 1032   KETONESUR 5 (A) 12/20/2017 1330   PROTEINUR 100 (A) 12/20/2017 1330   UROBILINOGEN 0.2 11/27/2017 1032   UROBILINOGEN 1.0 10/13/2013 1855   NITRITE POSITIVE (A) 12/20/2017 1330   LEUKOCYTESUR LARGE (A) 12/20/2017 1330      Imaging Results:    No results found.  My personal review of EKG: Rhythm Atrial fibrillation with RVR   Assessment & Plan:    Active Problems:   UTI (urinary tract infection)   1. Sepsis due to UTI- will start patient on ceftriaxone per pharmacy consultation, urine culture has been obtained in the ED. Check lactic acid every 3 hours. 2. Atrial fibrillation with RVR- likely exacerbated by UTI, continue IV normal saline. Will continue amiodarone, Coreg. If no improvement will start cardizem infusion. Chads risk score is 5, continue anticoagulation with Savasya. 3. Seizure disorder- continue Keppra, phenytoin. Patient was given 1 g of IV Keppra in the ED. 4. Ischemic cardiomyopathy- status post ICD [employer], continue Coreg, hold BiDil and losartan at this time due to hypotension. 5. Factor VII deficiency with prior DVTs- continue anticoagulation with Savasya.   DVT Prophylaxis-   Edoxaban  AM Labs Ordered, also please review Full Orders  Family Communication: Admission, patients condition and plan of care including tests being ordered have been discussed with the patient  who indicate understanding and agree with the plan and Code Status.  Code Status:  Full  code  Admission status: Inpatient  Time spent in minutes : 60 min   Oswald Hillock M.D on 12/20/2017 at 4:58 PM  Between 7am to 7pm - Pager - [phone]. After 7pm go to [email or web address] - password [facility]  [facility] - Office  [phone]

## 2017-12-20 NOTE — Telephone Encounter (Signed)
Pt is in the ED now.

## 2017-12-20 NOTE — Progress Notes (Signed)
Pharmacy Antibiotic Note  [facility] [name] is a 71 y.o. male admitted on 12/20/2017 with UTI.  Pharmacy has been consulted for Rocephin dosing. U/A +.   Plan: Rocephin 2 gm given at 1753 then Rocephin 1 gm IV q24 Pharmacy to sign off  Height: 5\' 9"  (175.3 cm) Weight: 220 lb (99.8 kg) IBW/kg (Calculated) : 70.7  Temp (24hrs), Avg:99 F (37.2 C), Min:98.5 F (36.9 C), Max:99.4 F (37.4 C)  Recent Labs  Lab 12/20/17 1450  WBC 15.2*  CREATININE 1.64*    Estimated Creatinine Clearance: 49.5 mL/min (A) (by C-G formula based on SCr of 1.64 mg/dL (H)).    No Known Allergies  Thank you for allowing pharmacy to be a part of this patient's care. Eudelia Bunch, Pharm.D. [phone] 12/20/2017 6:46 PM

## 2017-12-20 NOTE — ED Notes (Addendum)
Delay in labs, NT Apolonio Schneiders x2 unsuccessful attempt at lab draw. Hospitalist aware and will allow fluids to run and main lab will be called to obtain blood cultures.

## 2017-12-20 NOTE — ED Triage Notes (Signed)
Patient brought in by [facility] for a fall at home in the bathroom. Patient fell forward to his knees. Left knee pain. Patient has been weak over the past few days. Urine has been darker in color and patient has not been eating and drinking as much over the past few days. Patient did not hit his head. No blood thinners.

## 2017-12-20 NOTE — ED Provider Notes (Signed)
[facility] DEPT Provider Note   CSN: [id number] Arrival date & time: 12/20/17  1227     History   Chief Complaint No chief complaint on file. Chief complaint fall  HPI [employer] is a 71 y.o. male.  Patient fell in his bathroom earlier today.  He denies feeling weak.  He denies pain anywhere as result of the fall.  He was using his walker at the time of the fall.  No injury.  No treatment prior to coming here.  No syncope no headache no chest pain or abdominal pain no lightheadedness.  He is presently hungry.  HPI  Past Medical History:  Diagnosis Date  . AICD (automatic cardioverter/defibrillator) present   . Atrial fibrillation   09/22/2012  . Blood loss anemia 04/18/2017   After GI bleed from colonoscopy and polypectomy  . CAD (coronary artery disease)   . Chronic systolic heart failure ([place])   . 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 GIB (gastrointestinal bleeding) 02/20/2017  . History of MRSA infection 05/2011  . HTN (hypertension)   . Hx of adenomatous colonic polyps 02/20/2017   01/2017 - 3 cm sigmoid TV adenoma and other smaller polyps - had post-polypectomy bleed Tx w/ clips Consider repeat colonoscopy 3 yrs [name], MD, [name]   . Hyperlipidemia   . implantable cardiac defibrillator-Biotronik    Device Implanted 2006; s/p gen change 03/2011 : bleeding persistent with pocket erosion and infection; explant and reimplant  06/2011  . Ischemic cardiomyopathy    EF 15 to 20% by TTE and TEE in 09/2012.  Severe LV dysfunction  . Obesity    BMI 31 in 09/2012  . Seizure disorder ([place]) latest 09/30/2012  . Seizures ([place])   . Stroke [facility])     Patient Active Problem List   Diagnosis Date Noted  . Hallucination 11/27/2017  . Abnormal urine odor 11/27/2017  . 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 Elevated TSH 10/27/2017  . Weakness of both lower extremities 10/27/2017  . Unsteady gait  10/27/2017  . Localization-related symptomatic epilepsy and epileptic syndromes with complex partial seizures, not intractable, without status epilepticus ([place]) 10/08/2017  . Mass of torso 09/02/2017  . Insomnia 09/02/2017  . Degenerative joint disease of knee, left 08/01/2017  . Blood loss anemia 04/18/2017  . Hx of adenomatous colonic polyps 02/20/2017  . Chronic pain of left knee 01/22/2017  . Thrush 12/15/2016  . Chronic kidney disease (CKD), stage III (moderate) ([place]) 11/08/2016  . Hypertensive heart disease without heart failure   . Chronic chest wall pain 08/17/2016  . Periodontal disease 05/18/2016  . Dental infection 05/18/2016  . HLD (hyperlipidemia) 02/26/2016  . CVA (cerebral infarction) 02/26/2016  . Near syncope 02/26/2016  . Injury of right thumb 12/30/2015  . Gout 12/30/2015  . Afib ([place]) 04/21/2014  . Chronic systolic heart failure ([place]) 11/06/2012  . 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 Coagulopathy ([place]) 10/03/2012  . Seizure ([place]) 10/01/2012  . Renal insufficiency 10/01/2012  . Ischemic cardiomyopathy  ? additional rate component 09/29/2012  . Automatic implantable cardioverter-defibrillator in situ   . HTN (hypertension) 09/21/2012  . Coronary artery disease involving native heart 09/21/2012    Past Surgical History:  Procedure Laterality Date  . CARDIAC CATHETERIZATION N/A 10/12/2015   Procedure: Right Heart Cath;  Surgeon: [name], MD;  Location: [facility];  Service: Cardiovascular;  Laterality: N/A;  . CARDIOVERSION  09/24/2012  Procedure: CARDIOVERSION;  Surgeon: [name], MD;  Location: [facility];  Service: Cardiovascular;  Laterality: N/A;  . COLONOSCOPY W/ POLYPECTOMY  02/13/2017  . CORONARY ARTERY BYPASS GRAFT    . FLEXIBLE SIGMOIDOSCOPY N/A 02/20/2017   Procedure: FLEXIBLE SIGMOIDOSCOPY;  Surgeon: [name], MD;  Location[facility] ENDOSCOPY;  Service: Endoscopy;  Laterality: N/A;  . FLEXIBLE SIGMOIDOSCOPY N/A  02/21/2017   Procedure: FLEXIBLE SIGMOIDOSCOPY;  Surgeon: [name], MD;  [facility]COPY;  Service: Endoscopy;  Laterality: N/A;  . ICD    . TEE WITHOUT CARDIOVERSION  09/24/2012   Procedure: TRANSESOPHAGEAL ECHOCARDIOGRAM (TEE);  Surgeon: [name], MD;  Location: [facility] ENDOSCOPY;  Service: Cardiovascular;  Laterality: N/A;  dave/anesth, dl, cindy/echo        Home Medications    Prior to Admission medications   Medication Sig Start Date End Date Taking? Authorizing Provider  acetaminophen (TYLENOL) 325 MG tablet Take 650 mg by mouth every 6 (six) hours as needed for mild pain.    [provider]  amiodarone (PACERONE) 200 MG tablet Take 1 tablet (200 mg total) by mouth daily. 08/24/16   [name], MD  BIDIL 20-37.5 MG tablet TAKE TWO TABLETS BY MOUTH THREE TIMES DAILY 07/03/17   [name], [name], MD  carvedilol (COREG) 3.125 MG tablet Take 1 tablet (3.125 mg total) by mouth 2 (two) times daily. 02/04/17   [name], [name], MD  diclofenac sodium (VOLTAREN) 1 % GEL Apply 4 g topically 4 (four) times daily as needed. Patient taking differently: Apply 4 g topically 4 (four) times daily as needed (pain).  07/16/17   Biagio Borg, MD  DULoxetine (CYMBALTA) 30 MG capsule Take 1 capsule (30 mg total) by mouth daily. 09/19/17   [name], MD  ferrous sulfate 325 (65 FE) MG tablet Take 1 tablet (325 mg total) 2 (two) times daily with a meal by mouth. 11/07/17   [name], MD  furosemide (LASIX) 40 MG tablet TAKE ONE TABLET BY MOUTH ONCE DAILY Patient taking differently: TAKE 40mg  BY MOUTH ONCE DAILY 02/27/17   [name], [name], MD  levETIRAcetam (KEPPRA) 1000 MG tablet Take 1 tablet (1,000 mg total) by mouth 2 (two) times daily. 10/07/17   [name], MD  losartan (COZAAR) 25 MG tablet TAKE ONE TABLET BY MOUTH ONCE DAILY Patient taking differently: TAKE 25mg  BY MOUTH ONCE DAILY 03/27/17   [name], [name], MD  nystatin (MYCOSTATIN)  100000 UNIT/ML suspension SWISH, GARGLE AND SPIT OUT 5-10 MILLILITERS EVERY 6 HOURS AS NEEDED 09/19/17   [name], MD  phenytoin (DILANTIN) 300 MG ER capsule Take 1 capsule (300 mg total) by mouth at bedtime. 10/07/17   [name], MD  polyethylene glycol [facility]) packet Take 17 g by mouth daily as needed. Patient taking differently: Take 17 g by mouth daily as needed for mild constipation.  02/25/17   Hosie Poisson, MD  SAVAYSA 60 MG TABS tablet TAKE 1 TABLET BY MOUTH ONCE DAILY Patient taking differently: TAKE 60mg  BY MOUTH ONCE DAILY 08/30/17   [name], [name], MD  senna-docusate (SENOKOT-S) 8.6-50 MG tablet Take 2 tablets by mouth 2 (two) times daily. Patient not taking: Reported on 11/27/2017 02/25/17   Hosie Poisson, MD  simvastatin (ZOCOR) 20 MG tablet Take 1 tablet (20 mg total) by mouth daily at 6 PM. Overdue for annual appt w/labs must make appt for future refills 07/16/17   Biagio Borg, MD  spironolactone (  ALDACTONE) 25 MG tablet TAKE ONE-HALF TABLET BY MOUTH ONCE DAILY Patient taking differently: TAKE 12.5mg  TABLET BY MOUTH ONCE DAILY 03/27/17   Shirley Friar, PA-C  tamsulosin [facility]) 0.4 MG CAPS capsule Take 1 capsule (0.4 mg total) daily by mouth. 11/07/17   [name], MD  triamcinolone cream (KENALOG) 0.1 % Apply 1 application 2 (two) times daily topically. 11/07/17 11/07/18  [name], MD  Vitamin D, Ergocalciferol, (DRISDOL) 50000 units CAPS capsule Take 1 capsule (50,000 Units total) by mouth every 7 (seven) days. 11/28/17   [name], MD    Family History Family History  Problem Relation Age of Onset  . Arthritis Mother   . Heart disease Mother   . Heart attack Mother   . Other Father        smoker  . Hypertension Neg Hx        unknown  . Stroke Neg Hx        unknown    Social History Social History   Tobacco Use  . Smoking status: Former Smoker    Types: Pipe    Last attempt to quit:  09/21/2008    Years since quitting: 9.2  . Smokeless tobacco: Former Systems developer    Quit date: 09/21/2008  Substance Use Topics  . Alcohol use: No  . Drug use: No     Allergies   Patient has no known allergies.   Review of Systems Review of Systems  Constitutional: Negative.   HENT: Negative.   Respiratory: Negative.   Cardiovascular: Negative.   Gastrointestinal: Negative.   Genitourinary:       "Dark urine" for 1 month  Musculoskeletal: Positive for gait problem.  Skin: Negative.   Psychiatric/Behavioral: Negative.   All other systems reviewed and are negative.    Physical Exam Updated Vital Signs BP 123/76 (BP Location: Left Arm)   Pulse 91   Temp 99.4 F (37.4 C) (Oral)   Resp (!) 24   Ht 5\' 9"  (1.753 m)   Wt 99.8 kg (220 lb)   SpO2 97%   BMI 32.49 kg/m   Physical Exam  Constitutional: He appears well-developed and well-nourished. No distress.  HENT:  Head: Normocephalic and atraumatic.  Eyes: Conjunctivae are normal. Pupils are equal, round, and reactive to light.  Neck: Neck supple. No tracheal deviation present. No thyromegaly present.  Cardiovascular: Normal rate and regular rhythm.  No murmur heard. Pulmonary/Chest: Effort normal and breath sounds normal.  Abdominal: Soft. Bowel sounds are normal. He exhibits no distension. There is no tenderness.  Musculoskeletal: Normal range of motion. He exhibits no edema or tenderness.  Tired spine is nontender.  Pelvis stable nontender.  All 4 extremities or contusion abrasion or tenderness neurovascular intact  Neurological: He is alert. Coordination normal.  Skin: Skin is warm and dry. Capillary refill takes less than 2 seconds. No rash noted.  Psychiatric: He has a normal mood and affect.  Nursing note and vitals reviewed.    ED Treatments / Results  Labs (all labs ordered are listed, but only abnormal results are displayed) Labs Reviewed - No data to display  EKG  EKG Interpretation  Date/Time:  Friday  December 20 2017 12:56:34 EST Ventricular Rate:  90 PR Interval:    QRS Duration: 129 QT Interval:  393 QTC Calculation: 481 R Axis:   -66 Text Interpretation:  Sinus rhythm Supraventricular bigeminy Nonspecific IVCD with LAD LVH with secondary repolarization abnormality Anterior Q waves, possibly due to LVH No significant  change since last tracing Confirmed by Orlie Dakin [phone]) on 12/20/2017 1:14:49 PM      Repeat EKG 1620 ED ECG REPORT   Date: 12/20/2017  Rate: 125  Rhythm: atrial fibrillation  QRS Axis: left  Intervals: QT prolonged  ST/T Wave abnormalities: nonspecific T wave changes  Conduction Disutrbances:none  Narrative Interpretation:   Old EKG Reviewed: changes noted Pacing from earlier today showed sinus tracing I have personally reviewed the EKG tracing and agree with the computerized printout as noted.  Radiology No results found.  Procedures Procedures (including critical care time)  Medications Ordered in ED Medications - No data to display 330 p.m. patient noted to be tachycardic.  He remains alert and feels well.  At 4 PM remains tachycardic pulse 136 blood pressure 96/63.  Code sepsis called.  Source of infection felt to be urine.  Sirs criteria include tachycardia and tachypnea IV antibiotics and IV fluid bolus ordered.  Dr. Darrick Meigs consulted and will arrange for admission Results for orders placed or performed during the hospital encounter of 12/20/17  Comprehensive metabolic panel  Result Value Ref Range   Sodium 132 (L) 135 - 145 mmol/L   Potassium 4.3 3.5 - 5.1 mmol/L   Chloride 96 (L) 101 - 111 mmol/L   CO2 25 22 - 32 mmol/L   Glucose, Bld 83 65 - 99 mg/dL   BUN 36 (H) 6 - 20 mg/dL   Creatinine, Ser 1.64 (H) 0.61 - 1.24 mg/dL   Calcium 9.2 8.9 - 10.3 mg/dL   Total Protein 9.0 (H) 6.5 - 8.1 g/dL   Albumin 3.8 3.5 - 5.0 g/dL   AST 26 15 - 41 U/L   ALT 15 (L) 17 - 63 U/L   Alkaline Phosphatase 118 38 - 126 U/L   Total Bilirubin 1.6 (H) 0.3 -  1.2 mg/dL   GFR calc non Af Amer 41 (L) >60 mL/min   GFR calc Af Amer 48 (L) >60 mL/min   Anion gap 11 5 - 15  CBC with Differential/Platelet  Result Value Ref Range   WBC 15.2 (H) 4.0 - 10.5 K/uL   RBC 5.21 4.22 - 5.81 MIL/uL   Hemoglobin 16.5 13.0 - 17.0 g/dL   HCT 47.3 39.0 - 52.0 %   MCV 90.8 78.0 - 100.0 fL   MCH 31.7 26.0 - 34.0 pg   MCHC 34.9 30.0 - 36.0 g/dL   RDW 13.8 11.5 - 15.5 %   Platelets 278 150 - 400 K/uL   Neutrophils Relative % 84 %   Neutro Abs 12.8 (H) 1.7 - 7.7 K/uL   Lymphocytes Relative 3 %   Lymphs Abs 0.5 (L) 0.7 - 4.0 K/uL   Monocytes Relative 13 %   Monocytes Absolute 1.9 (H) 0.1 - 1.0 K/uL   Eosinophils Relative 0 %   Eosinophils Absolute 0.0 0.0 - 0.7 K/uL   Basophils Relative 0 %   Basophils Absolute 0.0 0.0 - 0.1 K/uL  Urinalysis, Routine w reflex microscopic  Result Value Ref Range   Color, Urine YELLOW YELLOW   APPearance TURBID (A) CLEAR   Specific Gravity, Urine 1.013 1.005 - 1.030   pH 5.0 5.0 - 8.0   Glucose, UA NEGATIVE NEGATIVE mg/dL   Hgb urine dipstick LARGE (A) NEGATIVE   Bilirubin Urine NEGATIVE NEGATIVE   Ketones, ur 5 (A) NEGATIVE mg/dL   Protein, ur 100 (A) NEGATIVE mg/dL   Nitrite POSITIVE (A) NEGATIVE   Leukocytes, UA LARGE (A) NEGATIVE   RBC / HPF TOO  NUMEROUS TO COUNT 0 - 5 RBC/hpf   WBC, UA TOO NUMEROUS TO COUNT 0 - 5 WBC/hpf   Bacteria, UA MANY (A) NONE SEEN   Squamous Epithelial / LPF 0-5 (A) NONE SEEN   WBC Clumps PRESENT    Mucus PRESENT    Hyaline Casts, UA PRESENT    No results found. Initial Impression / Assessment and Plan / ED Course  I have reviewed the triage vital signs and the nursing notes.  Pertinent labs & imaging results that were available during my care of the patient were reviewed by me and considered in my medical decision making (see chart for details).       Sepsis - Repeat Assessment  Performed at:    410 pm  Vitals     Blood pressure 113/76, pulse (!) 124, temperature 99.4 F (37.4  C), temperature source Oral, resp. rate (!) 22, height 5\' 9"  (1.753 m), weight 99.8 kg (220 lb), SpO2 96 %.  Heart:     Irregular rate and rhythm tachycardic  Lungs:    CTA  Capillary Refill:   <2 sec  Peripheral Pulse:   Radial pulse palpable  Skin:     Normal Color    Final Clinical Impressions(s) / ED Diagnoses  Diagnoses #1 sepsis #2 urinary tract infection #3 renal insufficiency Final diagnoses:  None  #4 atrial fibrillation with rapid ventricular response CRITICAL CARE Performed by: Orlie Dakin Total critical care time: 45 minutes Critical care time was exclusive of separately billable procedures and treating other patients. Critical care was necessary to treat or prevent imminent or life-threatening deterioration. Critical care was time spent personally by me on the following activities: development of treatment plan with patient and/or surrogate as well as nursing, discussions with consultants, evaluation of patient's response to treatment, examination of patient, obtaining history from patient or surrogate, ordering and performing treatments and interventions, ordering and review of laboratory studies, ordering and review of radiographic studies, pulse oximetry and re-evaluation of patient's condition.  ED Discharge Orders    None       Orlie Dakin, MD 12/20/17 [phone]

## 2017-12-20 NOTE — ED Notes (Signed)
Pt given ham sandwich, cheese stick, and ginger ale.

## 2017-12-20 NOTE — Telephone Encounter (Signed)
error 

## 2017-12-21 DIAGNOSIS — I482 Chronic atrial fibrillation: Secondary | ICD-10-CM

## 2017-12-21 LAB — CBC
HCT: 44.6 % (ref 39.0–52.0)
Hemoglobin: 15.4 g/dL (ref 13.0–17.0)
MCH: 31.4 pg (ref 26.0–34.0)
MCHC: 34.5 g/dL (ref 30.0–36.0)
MCV: 90.8 fL (ref 78.0–100.0)
Platelets: 194 10*3/uL (ref 150–400)
RBC: 4.91 MIL/uL (ref 4.22–5.81)
RDW: 13.7 % (ref 11.5–15.5)
WBC: 11.7 10*3/uL — AB (ref 4.0–10.5)

## 2017-12-21 LAB — COMPREHENSIVE METABOLIC PANEL
ALBUMIN: 2.8 g/dL — AB (ref 3.5–5.0)
ALT: 11 U/L — ABNORMAL LOW (ref 17–63)
ANION GAP: 9 (ref 5–15)
AST: 19 U/L (ref 15–41)
Alkaline Phosphatase: 90 U/L (ref 38–126)
BUN: 26 mg/dL — AB (ref 6–20)
CHLORIDE: 103 mmol/L (ref 101–111)
CO2: 22 mmol/L (ref 22–32)
Calcium: 8.1 mg/dL — ABNORMAL LOW (ref 8.9–10.3)
Creatinine, Ser: 1.15 mg/dL (ref 0.61–1.24)
GFR calc Af Amer: >60 mL/min (ref >60)
GFR calc non Af Amer: >60 mL/min (ref >60)
GLUCOSE: 122 mg/dL — AB (ref 65–99)
POTASSIUM: 3.8 mmol/L (ref 3.5–5.1)
SODIUM: 134 mmol/L — AB (ref 135–145)
Total Bilirubin: 1.2 mg/dL (ref 0.3–1.2)
Total Protein: 6.9 g/dL (ref 6.5–8.1)

## 2017-12-21 NOTE — Progress Notes (Signed)
[facility]  PROGRESS NOTE  [facility] ZSW:[id number] DOB: [birth year] DOA: 12/20/2017 PCP: [name], MD   Brief HPI:   71 y.o. male, with history of ischemic cardiomyopathy status post AICD, EF 18 - 55%, as per echo from 4/18, chronic atrial fibrillation, stroke, seizure disorder, hypertension came to ED after patient fell in the bathroom. Patient denies passing out. As per patient he tripped in the bathroom. He denies chest pain or shortness of breath. No nausea vomiting or diarrhea. He does have a history of seizures. And is on Keppra and phenytoin at home.   Subjective   Patient seen and examined, denies abdominal pain.   Assessment/Plan:     1. Sepsis due to UTI- will start patient on ceftriaxone per pharmacy consultation, urine culture has been obtained in the ED. Will follow urine culture results. 2. Atrial fibrillation with RVR- likely exacerbated by UTI, continue IV normal saline. Will continue amiodarone, Coreg. If no improvement will start cardizem infusion. Chads risk score is 5, continue anticoagulation with Savasya. Was started on Cardizem, which has been discontinued. 3. Seizure disorder- continue Keppra, phenytoin.  4. Ischemic cardiomyopathy- status post ICD [employer], continue Coreg, hold BiDil and losartan at this time due to hypotension. 5. Factor VII deficiency with prior DVTs- continue anticoagulation with Savasya.       DVT prophylaxis: Edoxaban  Code Status: Full code  Family Communication: No family at bedside  Disposition Plan: TBD   Consultants:  None   Procedures:  None   Continuous infusions . sodium chloride 100 mL/hr at 12/21/17 0500  . cefTRIAXone (ROCEPHIN)  IV    . diltiazem (CARDIZEM) infusion Stopped (12/21/17 1000)      Antibiotics:   Anti-infectives (From admission, onward)   Start     Dose/Rate Route Frequency Ordered Stop   12/21/17 1800  cefTRIAXone (ROCEPHIN) 1 g in dextrose 5 % 50 mL IVPB      1 g 100 mL/hr over 30 Minutes Intravenous Every 24 hours 12/20/17 1843     12/20/17 1615  cefTRIAXone (ROCEPHIN) 2 g in dextrose 5 % 50 mL IVPB     2 g 100 mL/hr over 30 Minutes Intravenous  Once 12/20/17 1615 12/20/17 1823   12/20/17 1545  cefTRIAXone (ROCEPHIN) 1 g in dextrose 5 % 50 mL IVPB  Status:  Discontinued     1 g 100 mL/hr over 30 Minutes Intravenous  Once 12/20/17 1541 12/20/17 1614       Objective   Vitals:   12/21/17 0900 12/21/17 1000 12/21/17 1100 12/21/17 1203  BP: 124/85 (!) 119/91 (!) 135/92   Pulse: (!) 116 (!) 118    Resp: (!) 26 18 17    Temp:    (!) 100.4 F (38 C)  TempSrc:    Oral  SpO2: 98% 96% 96%   Weight:      Height:        Intake/Output Summary (Last 24 hours) at 12/21/2017 1324 Last data filed at 12/21/2017 1100 Gross per 24 hour  Intake 3716.33 ml  Output 1035 ml  Net 2681.33 ml   Filed Weights   12/20/17 1238  Weight: 99.8 kg (220 lb)     Physical Examination:  Physical Exam: Eyes: No icterus, extraocular muscles intact  Mouth: Oral mucosa is moist, no lesions on palate,  Neck: Supple, no deformities, masses, or tenderness Lungs: Normal respiratory effort, bilateral clear to auscultation, no crackles or wheezes.  Heart: Irregular rhythm, S1 and S2 normal, no murmurs, rubs auscultated  Abdomen: BS normoactive,soft,nondistended,non-tender to palpation,no organomegaly Extremities: No pretibial edema, no erythema, no cyanosis, no clubbing Neuro : Alert and oriented to time, place and person, No focal deficits      Data Reviewed: I have personally reviewed following labs and imaging studies  CBG: No results for input(s): GLUCAP in the last 168 hours.  CBC: Recent Labs  Lab 12/20/17 1450 12/21/17 0700  WBC 15.2* 11.7*  NEUTROABS 12.8*  --   HGB 16.5 15.4  HCT 47.3 44.6  MCV 90.8 90.8  PLT 278 196    Basic Metabolic Panel: Recent Labs  Lab 12/20/17 1450 12/21/17 0700  NA 132* 134*  K 4.3 3.8  CL 96* 103  CO2  25 22  GLUCOSE 83 122*  BUN 36* 26*  CREATININE 1.64* 1.15  CALCIUM 9.2 8.1*    Recent Results (from the past 240 hour(s))  MRSA PCR Screening     Status: None   Collection Time: 12/20/17  6:25 PM  Result Value Ref Range Status   MRSA by PCR NEGATIVE NEGATIVE Final    Comment:        The GeneXpert MRSA Assay (FDA approved for NASAL specimens only), is one component of a comprehensive MRSA colonization surveillance program. It is not intended to diagnose MRSA infection nor to guide or monitor treatment for MRSA infections.   Blood Culture (routine x 2)     Status: None (Preliminary result)   Collection Time: 12/20/17  7:59 PM  Result Value Ref Range Status   Specimen Description BLOOD RIGHT ANTECUBITAL  Final   Special Requests   Final    BOTTLES DRAWN AEROBIC AND ANAEROBIC Blood Culture adequate volume   Culture   Final    NO GROWTH < 24 HOURS Performed at [facility], [place] [address] [place], [place] [address]    Report Status PENDING  Incomplete  Blood Culture (routine x 2)     Status: None (Preliminary result)   Collection Time: 12/20/17  8:22 PM  Result Value Ref Range Status   Specimen Description BLOOD LEFT HAND  Final   Special Requests IN PEDIATRIC BOTTLE Blood Culture adequate volume  Final   Culture   Final    NO GROWTH < 24 HOURS Performed at [facility], [place] [address] [place], [place] [address]    Report Status PENDING  Incomplete     Liver Function Tests: Recent Labs  Lab 12/20/17 1450 12/21/17 0700  AST 26 19  ALT 15* 11*  ALKPHOS 118 90  BILITOT 1.6* 1.2  PROT 9.0* 6.9  ALBUMIN 3.8 2.8*   No results for input(s): LIPASE, AMYLASE in the last 168 hours. No results for input(s): AMMONIA in the last 168 hours.  Cardiac Enzymes: No results for input(s): CKTOTAL, CKMB, CKMBINDEX, TROPONINI in the last 168 hours. BNP (last 3 results) Recent Labs    09/08/17 1741  BNP 108.7*    ProBNP (last 3 results) Recent Labs     11/27/17 0932  PROBNP 50.0      Studies: No results found.  Scheduled Meds: . amiodarone  200 mg Oral Daily  . carvedilol  3.125 mg Oral BID WC  . DULoxetine  30 mg Oral Daily  . edoxaban  60 mg Oral Daily  . ferrous sulfate  325 mg Oral BID WC  . levETIRAcetam  1,000 mg Oral BID  . phenytoin  300 mg Oral QHS  . senna-docusate  2 tablet Oral BID  . simvastatin  20  mg Oral q1800      Time spent: 25 min  [facility] Pager [phone]. If 7PM-7AM, please contact night-coverage at [email or web address], Office  [phone]  password [email or web address]  12/21/2017, 1:24 PM  LOS: 1 day

## 2017-12-22 LAB — URINE CULTURE: Culture: >=100000 — AB

## 2017-12-22 MED ORDER — CARVEDILOL 12.5 MG PO TABS
12.5000 mg | ORAL_TABLET | Freq: Two times a day (BID) | ORAL | Status: DC
  Administered 2017-12-22 – 2017-12-28 (×13): 12.5 mg via ORAL
  Filled 2017-12-22 (×13): qty 1

## 2017-12-22 NOTE — Progress Notes (Signed)
[facility]  PROGRESS NOTE  [facility]. PYP:[id number] DOB: 09-16-1946 DOA: 12/20/2017 PCP: [name], MD   Brief HPI:   71 y.o. male, with history of ischemic cardiomyopathy status post AICD, EF 29 - 55%, as per echo from 4/18, chronic atrial fibrillation, stroke, seizure disorder, hypertension came to ED after patient fell in the bathroom. Patient denies passing out. As per patient he tripped in the bathroom. He denies chest pain or shortness of breath. No nausea vomiting or diarrhea. He does have a history of seizures. And is on Keppra and phenytoin at home.   Subjective   Patient seen and examined, denies abdominal pain. Heart rate elevated this morning.   Assessment/Plan:     1. Sepsis due to UTI-  sepsis physiology has resolved, WBC down to 11.9 patient was started on ceftriaxone per pharmacy consultation, urine culture going Serratia marcescens sensitive to ceftriaxone, continue ceftriaxone. 2. Atrial fibrillation with RVR- likely exacerbated by UTI, continue IV normal saline. Will continue amiodarone, Coreg. Patient is off Cardizem drip.  Heart rate in 120s. Will increase the dose of Coreg to 12.5 mg PO BID.. If no improvement will start cardizem infusion. Chads risk score is 5, continue anticoagulation with Savasya. Was started on Cardizem, which has been discontinued. 3. Seizure disorder- continue Keppra, phenytoin.  4. Ischemic cardiomyopathy- status post ICD [employer], continue Coreg, hold BiDil and losartan at this time due to hypotension. 5. Factor VII deficiency with prior DVTs- continue anticoagulation with Savasya.    DVT prophylaxis: Edoxaban  Code Status: Full code  Family Communication: No family at bedside  Disposition Plan: TBD   Consultants:  None   Procedures:  None   Continuous infusions . sodium chloride 100 mL/hr at 12/22/17 0800  . cefTRIAXone (ROCEPHIN)  IV Stopped (12/21/17 1828)      Antibiotics:    Anti-infectives (From admission, onward)   Start     Dose/Rate Route Frequency Ordered Stop   12/21/17 1800  cefTRIAXone (ROCEPHIN) 1 g in dextrose 5 % 50 mL IVPB     1 g 100 mL/hr over 30 Minutes Intravenous Every 24 hours 12/20/17 1843     12/20/17 1615  cefTRIAXone (ROCEPHIN) 2 g in dextrose 5 % 50 mL IVPB     2 g 100 mL/hr over 30 Minutes Intravenous  Once 12/20/17 1615 12/20/17 1823   12/20/17 1545  cefTRIAXone (ROCEPHIN) 1 g in dextrose 5 % 50 mL IVPB  Status:  Discontinued     1 g 100 mL/hr over 30 Minutes Intravenous  Once 12/20/17 1541 12/20/17 1614       Objective   Vitals:   12/22/17 0300 12/22/17 0311 12/22/17 0400 12/22/17 0800  BP: (!) 123/91  (!) 144/100 (!) 130/99  Pulse: (!) 121  (!) 123 (!) 124  Resp: 17  19 20   Temp:  98.6 F (37 C)  98.3 F (36.8 C)  TempSrc:  Axillary  Oral  SpO2: 98%  100% 99%  Weight:      Height:        Intake/Output Summary (Last 24 hours) at 12/22/2017 1118 Last data filed at 12/22/2017 1000 Gross per 24 hour  Intake 2740 ml  Output 1350 ml  Net 1390 ml   Filed Weights   12/20/17 1238  Weight: 99.8 kg (220 lb)     Physical Examination:  Physical Exam: Eyes: No icterus, extraocular muscles intact  Mouth: Oral mucosa is moist, no lesions on palate,  Neck: Supple, no deformities, masses, or  tenderness Lungs: Normal respiratory effort, bilateral clear to auscultation, no crackles or wheezes.  Heart: irregular rhythm, S1 and S2 normal, no murmurs, rubs auscultated Abdomen: BS normoactive,soft,nondistended,non-tender to palpation,no organomegaly Extremities: No pretibial edema, no erythema, no cyanosis, no clubbing Neuro : Alert and oriented to time, place and person, No focal deficits Skin: No rashes seen on exam     Data Reviewed: I have personally reviewed following labs and imaging studies  CBG: No results for input(s): GLUCAP in the last 168 hours.  CBC: Recent Labs  Lab 12/20/17 1450 12/21/17 0700  WBC  15.2* 11.7*  NEUTROABS 12.8*  --   HGB 16.5 15.4  HCT 47.3 44.6  MCV 90.8 90.8  PLT 278 751    Basic Metabolic Panel: Recent Labs  Lab 12/20/17 1450 12/21/17 0700  NA 132* 134*  K 4.3 3.8  CL 96* 103  CO2 25 22  GLUCOSE 83 122*  BUN 36* 26*  CREATININE 1.64* 1.15  CALCIUM 9.2 8.1*    Recent Results (from the past 240 hour(s))  Urine Culture     Status: Abnormal   Collection Time: 12/20/17  1:30 PM  Result Value Ref Range Status   Specimen Description URINE, CLEAN CATCH  Final   Special Requests NONE  Final   Culture >=100,000 COLONIES/mL SERRATIA MARCESCENS (A)  Final   Report Status 12/22/2017 FINAL  Final   Organism ID, Bacteria SERRATIA MARCESCENS (A)  Final      Susceptibility   Serratia marcescens - MIC*    CEFAZOLIN >=64 RESISTANT Resistant     CEFTRIAXONE <=1 SENSITIVE Sensitive     CIPROFLOXACIN <=0.25 SENSITIVE Sensitive     GENTAMICIN <=1 SENSITIVE Sensitive     NITROFURANTOIN 256 RESISTANT Resistant     TRIMETH/SULFA <=20 SENSITIVE Sensitive     * >=100,000 COLONIES/mL SERRATIA MARCESCENS  MRSA PCR Screening     Status: None   Collection Time: 12/20/17  6:25 PM  Result Value Ref Range Status   MRSA by PCR NEGATIVE NEGATIVE Final    Comment:        The GeneXpert MRSA Assay (FDA approved for NASAL specimens only), is one component of a comprehensive MRSA colonization surveillance program. It is not intended to diagnose MRSA infection nor to guide or monitor treatment for MRSA infections.   Blood Culture (routine x 2)     Status: None (Preliminary result)   Collection Time: 12/20/17  7:59 PM  Result Value Ref Range Status   Specimen Description BLOOD RIGHT ANTECUBITAL  Final   Special Requests   Final    BOTTLES DRAWN AEROBIC AND ANAEROBIC Blood Culture adequate volume   Culture   Final    NO GROWTH < 24 HOURS Performed at [facility], [place] [address] [place], [place] [address]    Report Status PENDING  Incomplete  Blood Culture  (routine x 2)     Status: None (Preliminary result)   Collection Time: 12/20/17  8:22 PM  Result Value Ref Range Status   Specimen Description BLOOD LEFT HAND  Final   Special Requests IN PEDIATRIC BOTTLE Blood Culture adequate volume  Final   Culture   Final    NO GROWTH < 24 HOURS Performed at [facility], [place] [address], [place], [place] [address]    Report Status PENDING  Incomplete     Liver Function Tests: Recent Labs  Lab 12/20/17 1450 12/21/17 0700  AST 26 19  ALT 15* 11*  ALKPHOS 118 90  BILITOT 1.6* 1.2  PROT 9.0* 6.9  ALBUMIN 3.8 2.8*   No results for input(s): LIPASE, AMYLASE in the last 168 hours. No results for input(s): AMMONIA in the last 168 hours.  Cardiac Enzymes: No results for input(s): CKTOTAL, CKMB, CKMBINDEX, TROPONINI in the last 168 hours. BNP (last 3 results) Recent Labs    09/08/17 1741  BNP 108.7*    ProBNP (last 3 results) Recent Labs    11/27/17 0932  PROBNP 50.0      Studies: No results found.  Scheduled Meds: . amiodarone  200 mg Oral Daily  . carvedilol  12.5 mg Oral BID WC  . DULoxetine  30 mg Oral Daily  . edoxaban  60 mg Oral Daily  . ferrous sulfate  325 mg Oral BID WC  . levETIRAcetam  1,000 mg Oral BID  . phenytoin  300 mg Oral QHS  . senna-docusate  2 tablet Oral BID  . simvastatin  20 mg Oral q1800      Time spent: 25 min  [facility] Pager [phone]. If 7PM-7AM, please contact night-coverage at [email or web address], Office  [phone]  password [email or web address]  12/22/2017, 11:18 AM  LOS: 2 days

## 2017-12-23 ENCOUNTER — Encounter (HOSPITAL_COMMUNITY): Payer: Self-pay | Admitting: Physician Assistant

## 2017-12-23 DIAGNOSIS — I481 Persistent atrial fibrillation: Secondary | ICD-10-CM

## 2017-12-23 DIAGNOSIS — N390 Urinary tract infection, site not specified: Secondary | ICD-10-CM

## 2017-12-23 DIAGNOSIS — I5022 Chronic systolic (congestive) heart failure: Secondary | ICD-10-CM

## 2017-12-23 LAB — CBC
HCT: 44.1 % (ref 39.0–52.0)
Hemoglobin: 14.8 g/dL (ref 13.0–17.0)
MCH: 30.8 pg (ref 26.0–34.0)
MCHC: 33.6 g/dL (ref 30.0–36.0)
MCV: 91.9 fL (ref 78.0–100.0)
PLATELETS: 235 10*3/uL (ref 150–400)
RBC: 4.8 MIL/uL (ref 4.22–5.81)
RDW: 13.7 % (ref 11.5–15.5)
WBC: 7.8 10*3/uL (ref 4.0–10.5)

## 2017-12-23 LAB — BASIC METABOLIC PANEL
Anion gap: 6 (ref 5–15)
BUN: 15 mg/dL (ref 6–20)
CALCIUM: 8.2 mg/dL — AB (ref 8.9–10.3)
CO2: 22 mmol/L (ref 22–32)
CREATININE: 0.9 mg/dL (ref 0.61–1.24)
Chloride: 107 mmol/L (ref 101–111)
GFR calc Af Amer: >60 mL/min (ref >60)
GLUCOSE: 92 mg/dL (ref 65–99)
Potassium: 3.8 mmol/L (ref 3.5–5.1)
Sodium: 135 mmol/L (ref 135–145)

## 2017-12-23 MED ORDER — DILTIAZEM LOAD VIA INFUSION
5.0000 mg | Freq: Once | INTRAVENOUS | Status: DC
  Filled 2017-12-23: qty 5

## 2017-12-23 MED ORDER — ISOSORB DINITRATE-HYDRALAZINE 20-37.5 MG PO TABS
2.0000 | ORAL_TABLET | Freq: Three times a day (TID) | ORAL | Status: DC
  Administered 2017-12-23 – 2017-12-28 (×17): 2 via ORAL
  Filled 2017-12-23 (×21): qty 2

## 2017-12-23 MED ORDER — DILTIAZEM HCL-DEXTROSE 100-5 MG/100ML-% IV SOLN (PREMIX)
5.0000 mg/h | INTRAVENOUS | Status: DC
  Administered 2017-12-23 – 2017-12-25 (×3): 5 mg/h via INTRAVENOUS
  Filled 2017-12-23 (×3): qty 100

## 2017-12-23 MED ORDER — FUROSEMIDE 40 MG PO TABS
40.0000 mg | ORAL_TABLET | Freq: Every day | ORAL | Status: DC
  Administered 2017-12-23 – 2017-12-28 (×6): 40 mg via ORAL
  Filled 2017-12-23 (×6): qty 1

## 2017-12-23 MED ORDER — SPIRONOLACTONE 12.5 MG HALF TABLET
12.5000 mg | ORAL_TABLET | Freq: Every day | ORAL | Status: DC
  Administered 2017-12-23 – 2017-12-26 (×4): 12.5 mg via ORAL
  Filled 2017-12-23 (×4): qty 1

## 2017-12-23 MED ORDER — LOSARTAN POTASSIUM 25 MG PO TABS
25.0000 mg | ORAL_TABLET | Freq: Every day | ORAL | Status: DC
  Administered 2017-12-23 – 2017-12-28 (×6): 25 mg via ORAL
  Filled 2017-12-23 (×6): qty 1

## 2017-12-23 NOTE — Evaluation (Addendum)
Physical Therapy Evaluation Patient Details Name: [facility]. MRN: [id number] DOB: Jan 18, 1946 Today's Date: 12/23/2017   History of Present Illness  71 yo male admitted with UTI, A fib with RVR, fall at home. Hx of CHF, cardiomyopathy, ICD, Afib, CVA, Sz, HTN.   Clinical Impression  On eval, pt required Min assist for mobility. He walked ~20 feet with a RW. Pt presents with general weakness, decreased activity tolerance, and impaired gait and balance. Pt remains at risk for falls. He c/o L knee pain which appeared to significantly limit ambulation distance. At this time, recommendation is for [facility] rehab at [facility]. If pt progresses well, he may be able to d/c home if he has sufficient assistance in home. Will follow and progress activity as tolerated.     Follow Up Recommendations SNF(if pt is agreeable and/or if pt does not progress well)    Equipment Recommendations  Rolling walker with 5" wheels    Recommendations for Other Services       Precautions / Restrictions Precautions Precautions: Fall Restrictions Weight Bearing Restrictions: No      Mobility  Bed Mobility Overal bed mobility: Needs Assistance Bed Mobility: Supine to Sit     Supine to sit: Supervision;HOB elevated     General bed mobility comments: for safety, lines. Increased time.   Transfers Overall transfer level: Needs assistance Equipment used: Rolling walker (2 wheeled) Transfers: Sit to/from Stand Sit to Stand: Min assist         General transfer comment: Assist to rise, stabilize, control descent. VCs safety, technique, hand placement  Ambulation/Gait Ambulation/Gait assistance: Min assist Ambulation Distance (Feet): 20 Feet Assistive device: Rolling walker (2 wheeled) Gait Pattern/deviations: Step-to pattern;Decreased step length - right;Decreased step length - left;Decreased stride length;Trunk flexed     General Gait Details: Assist to stabilize pt throughout ambulation distance.  Distance limited by fatigue and pain in LE. VCs safety, posture, proper use of RW  Stairs            Wheelchair Mobility    Modified Rankin (Stroke Patients Only)       Balance Overall balance assessment: Needs assistance;History of Falls         Standing balance support: Bilateral upper extremity supported Standing balance-Leahy Scale: Poor                               Pertinent Vitals/Pain Pain Assessment: Faces Faces Pain Scale: Hurts even more Pain Location: L knee Pain Descriptors / Indicators: Aching;Sore Pain Intervention(s): Limited activity within patient's tolerance;Repositioned    Home Living Family/patient expects to be discharged to:: Private residence Living Arrangements: Spouse/significant other Available Help at Discharge: Family Type of Home: Apartment Home Access: Level entry     Home Layout: One level Home Equipment: Kasandra Knudsen - single point      Prior Function Level of Independence: Needs assistance   Gait / Transfers Assistance Needed: pt uses a cane           Hand Dominance        Extremity/Trunk Assessment   Upper Extremity Assessment Upper Extremity Assessment: Generalized weakness    Lower Extremity Assessment Lower Extremity Assessment: Generalized weakness    Cervical / Trunk Assessment Cervical / Trunk Assessment: Normal  Communication   Communication: No difficulties  Cognition Arousal/Alertness: Awake/alert Behavior During Therapy: WFL for tasks assessed/performed Overall Cognitive Status: Within Functional Limits for tasks assessed  General Comments      Exercises     Assessment/Plan    PT Assessment Patient needs continued PT services  PT Problem List Decreased strength;Decreased mobility;Decreased activity tolerance;Decreased balance;Decreased knowledge of use of DME;Pain       PT Treatment Interventions DME instruction;Therapeutic  activities;Gait training;Functional mobility training;Patient/family education;Therapeutic exercise    PT Goals (Current goals can be found in the Care Plan section)  Acute Rehab PT Goals Patient Stated Goal: less pain. regain PLOF.  PT Goal Formulation: With patient Time For Goal Achievement: 01/06/18 Potential to Achieve Goals: Good    Frequency Min 3X/week   Barriers to discharge        Co-evaluation               AM-PAC PT "6 Clicks" Daily Activity  Outcome Measure Difficulty turning over in bed (including adjusting bedclothes, sheets and blankets)?: A Little Difficulty moving from lying on back to sitting on the side of the bed? : A Little Difficulty sitting down on and standing up from a chair with arms (e.g., wheelchair, bedside commode, etc,.)?: Unable Help needed moving to and from a bed to chair (including a wheelchair)?: A Little Help needed walking in hospital room?: A Little Help needed climbing 3-5 steps with a railing? : A Little 6 Click Score: 16    End of Session Equipment Utilized During Treatment: Gait belt Activity Tolerance: Patient limited by pain;Patient limited by fatigue Patient left: in chair;with call bell/phone within reach;with chair alarm set   PT Visit Diagnosis: Muscle weakness (generalized) (M62.81);Difficulty in walking, not elsewhere classified (R26.2)    Time: 1341-1406 PT Time Calculation (min) (ACUTE ONLY): 25 min   Charges:   PT Evaluation $PT Eval Moderate Complexity: 1 Mod PT Treatments $Gait Training: 8-22 mins   PT G Codes:        [name], MPT Pager: [phone]

## 2017-12-23 NOTE — Progress Notes (Signed)
Date: December 23, 2017 Rhonda Davis, BSN, RN3, CCM [phone] Chart and notes review for patient progress and needs. Will follow for case management and discharge needs. Next review date: [id number] 

## 2017-12-23 NOTE — Consult Note (Signed)
Cardiology Consultation:   Patient ID: Louis Ford.; [id number]; Oct 17, 1946   Admit date: 12/20/2017 Date of Consult: 12/23/2017  Primary Care Provider: [name], MD Primary Cardiologist: Dr Haroldine Laws Primary Electrophysiologist:  Dr Lovena Le   Patient Profile:   Louis [facility] [name] is a 71 y.o. male with a hx of CABG, MV 10/2016 w/ EF 39% and small defect but no ischemia, ICM s/p Biotronik ICD in VVI mode, chronic systolic CHF Echo 41/66/06 LVEF 30-35% -> improved to 55-60% (3/17 echo),HTN, HLD, paroxysmal atrial fibrillation, previous stroke, Factor 7 deficiency, and seizure disorder, who is being seen today for the evaluation of atrial fib at the request of Dr Darrick Meigs.  History of Present Illness:   Louis Ford was admitted 12/28 after a fall in the BR. He was using the walker and bent over to pick something up. He got off balance and fell. Denies syncope. Dx w/ a UTI>>Ceftriaxone. HR increased, pt initially in SR>>afib w/ RVR  He is not aware of the atrial fib. He cannot tell when he goes in/out of it. Does not feel like it has given him any symptoms. He feels like he is getting better, hopes to go home soon. No idea that he has been in this ever since shortly after admission.  He has not been SOB, no presyncope or syncope. No chest pain. Was tracking his weight at home, it was stable. No exertional symptoms.   His activity level is limited by MS issues. He is trying to get stronger. No sx when he exerts himself.   Last CHF visit was 02/2017, he was doing well, wt was 206. He is compliant with his medications and tries to watch his diet.   Past Medical History:  Diagnosis Date  . AICD (automatic cardioverter/defibrillator) present   . Atrial fibrillation   09/22/2012  . Blood loss anemia 04/18/2017   After GI bleed from colonoscopy and polypectomy  . CAD (coronary artery disease)   . Chronic systolic heart failure ([place])   . 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 Factor  VII deficiency ([place]) 10/07/2012  . GIB (gastrointestinal bleeding) 02/20/2017  . History of MRSA infection 05/2011  . HTN (hypertension)   . Hx of adenomatous colonic polyps 02/20/2017   01/2017 - 3 cm sigmoid TV adenoma and other smaller polyps - had post-polypectomy bleed Tx w/ clips Consider repeat colonoscopy 3 yrs [name], MD, [name]   . Hyperlipidemia   . implantable cardiac defibrillator-Biotronik    Device Implanted 2006; s/p gen change 03/2011 : bleeding persistent with pocket erosion and infection; explant and reimplant  06/2011  . Ischemic cardiomyopathy    EF 15 to 20% by TTE and TEE in 09/2012.  Severe LV dysfunction  . Obesity    BMI 31 in 09/2012  . Persistent atrial fibrillation with rapid ventricular response ([place]) 04/21/2014  . Seizure disorder ([place]) latest 09/30/2012  . Seizures ([place])   . Stroke [facility])     Past Surgical History:  Procedure Laterality Date  . CARDIAC CATHETERIZATION N/A 10/12/2015   Procedure: Right Heart Cath;  Surgeon: [name], MD;  Location: [facility];  Service: Cardiovascular;  Laterality: N/A;  . CARDIOVERSION  09/24/2012   Procedure: CARDIOVERSION;  Surgeon: [name], MD;  Location: [facility];  Service: Cardiovascular;  Laterality: N/A;  . COLONOSCOPY W/ POLYPECTOMY  02/13/2017  . CORONARY ARTERY BYPASS GRAFT  2011   in [place]  . FLEXIBLE SIGMOIDOSCOPY N/A 02/20/2017   Procedure:  FLEXIBLE SIGMOIDOSCOPY;  Surgeon: [name], MD;  Location: [facility] ENDOSCOPY;  Service: Endoscopy;  Laterality: N/A;  . FLEXIBLE SIGMOIDOSCOPY N/A 02/21/2017   Procedure: FLEXIBLE SIGMOIDOSCOPY;  Surgeon: [name], MD;  Location: [facility] ENDOSCOPY;  Service: Endoscopy;  Laterality: N/A;  . ICD    . TEE WITHOUT CARDIOVERSION  09/24/2012   Procedure: TRANSESOPHAGEAL ECHOCARDIOGRAM (TEE);  Surgeon: [name], MD;  Location: [facility] ENDOSCOPY;  Service: Cardiovascular;  Laterality: N/A;  dave/anesth, dl, cindy/echo      Prior to Admission  medications   Medication Sig Start Date End Date Taking? Authorizing Provider  acetaminophen (TYLENOL) 325 MG tablet Take 650 mg by mouth every 6 (six) hours as needed for mild pain.   Yes [provider]  amiodarone (PACERONE) 200 MG tablet Take 1 tablet (200 mg total) by mouth daily. 08/24/16  Yes [name], MD  BIDIL 20-37.5 MG tablet TAKE TWO TABLETS BY MOUTH THREE TIMES DAILY 07/03/17  Yes [name], [name], MD  carvedilol (COREG) 3.125 MG tablet Take 1 tablet (3.125 mg total) by mouth 2 (two) times daily. 02/04/17  Yes [name], [name], MD  DULoxetine (CYMBALTA) 30 MG capsule Take 1 capsule (30 mg total) by mouth daily. 09/19/17  Yes [name], MD  ferrous sulfate 325 (65 FE) MG tablet Take 1 tablet (325 mg total) 2 (two) times daily with a meal by mouth. 11/07/17  Yes [name], MD  furosemide (LASIX) 40 MG tablet TAKE ONE TABLET BY MOUTH ONCE DAILY Patient taking differently: TAKE 40mg  BY MOUTH ONCE DAILY 02/27/17  Yes [name], [name], MD  levETIRAcetam (KEPPRA) 1000 MG tablet Take 1 tablet (1,000 mg total) by mouth 2 (two) times daily. 10/07/17  Yes [name], MD  losartan (COZAAR) 25 MG tablet TAKE ONE TABLET BY MOUTH ONCE DAILY Patient taking differently: TAKE 25mg  BY MOUTH ONCE DAILY 03/27/17  Yes [name], [name], MD  nystatin (MYCOSTATIN) 100000 UNIT/ML suspension SWISH, GARGLE AND SPIT OUT 5-10 MILLILITERS EVERY 6 HOURS AS NEEDED 09/19/17  Yes [name], MD  phenytoin (DILANTIN) 300 MG ER capsule Take 1 capsule (300 mg total) by mouth at bedtime. 10/07/17  Yes [name], MD  SAVAYSA 60 MG TABS tablet TAKE 1 TABLET BY MOUTH ONCE DAILY Patient taking differently: TAKE 60mg  BY MOUTH ONCE DAILY 08/30/17  Yes [name], [name], MD  senna-docusate (SENOKOT-S) 8.6-50 MG tablet Take 2 tablets by mouth 2 (two) times daily. 02/25/17  Yes Hosie Poisson, MD  simvastatin (ZOCOR) 20 MG tablet Take 1 tablet (20 mg total) by mouth  daily at 6 PM. Overdue for annual appt w/labs must make appt for future refills 07/16/17  Yes Biagio Borg, MD  spironolactone (ALDACTONE) 25 MG tablet TAKE ONE-HALF TABLET BY MOUTH ONCE DAILY Patient taking differently: TAKE 12.5mg  TABLET BY MOUTH ONCE DAILY 03/27/17  Yes Shirley Friar, PA-C  tamsulosin [facility]) 0.4 MG CAPS capsule Take 1 capsule (0.4 mg total) daily by mouth. 11/07/17  Yes [name], MD  Vitamin D, Ergocalciferol, (DRISDOL) 50000 units CAPS capsule Take 1 capsule (50,000 Units total) by mouth every 7 (seven) days. 11/28/17  Yes [name], MD  diclofenac sodium (VOLTAREN) 1 % GEL Apply 4 g topically 4 (four) times daily as needed. Patient not taking: Reported on 12/20/2017 07/16/17   Biagio Borg, MD  polyethylene glycol [facility] Floria Raveling) packet Take 17 g by mouth daily as needed. Patient not taking: Reported on 12/20/2017  02/25/17   Hosie Poisson, MD  triamcinolone cream (KENALOG) 0.1 % Apply 1 application 2 (two) times daily topically. Patient not taking: Reported on 12/20/2017 11/07/17 11/07/18  [name], MD    Inpatient Medications: Scheduled Meds: . amiodarone  200 mg Oral Daily  . carvedilol  12.5 mg Oral BID WC  . DULoxetine  30 mg Oral Daily  . edoxaban  60 mg Oral Daily  . ferrous sulfate  325 mg Oral BID WC  . furosemide  40 mg Oral Daily  . isosorbide-hydrALAZINE  2 tablet Oral TID  . levETIRAcetam  1,000 mg Oral BID  . losartan  25 mg Oral Daily  . phenytoin  300 mg Oral QHS  . senna-docusate  2 tablet Oral BID  . simvastatin  20 mg Oral q1800  . spironolactone  12.5 mg Oral Daily   Continuous Infusions: . sodium chloride 100 mL/hr at 12/23/17 1026  . cefTRIAXone (ROCEPHIN)  IV 1 g (12/22/17 1707)   PRN Meds: acetaminophen, ondansetron **OR** ondansetron (ZOFRAN) IV  Allergies:   No Known Allergies  Social History:   Social History   Socioeconomic History  . Marital status: Married    Spouse name:  Not on file  . Number of children: Not on file  . Years of education: Not on file  . Highest education level: Not on file  Social Needs  . Financial resource strain: Not on file  . Food insecurity - worry: Not on file  . Food insecurity - inability: Not on file  . Transportation needs - medical: Not on file  . Transportation needs - non-medical: Not on file  Occupational History  . Not on file  Tobacco Use  . Smoking status: Former Smoker    Types: Pipe    Last attempt to quit: 09/21/2008    Years since quitting: 9.2  . Smokeless tobacco: Former Systems developer    Quit date: 09/21/2008  Substance and Sexual Activity  . Alcohol use: No  . Drug use: No  . Sexual activity: No    Birth control/protection: None  Other Topics Concern  . Not on file  Social History Narrative   ** Merged History Encounter **        Family History:   Family History  Problem Relation Age of Onset  . Arthritis Mother   . Heart disease Mother   . Heart attack Mother   . Other Father        smoker  . Hypertension Neg Hx        unknown  . Stroke Neg Hx        unknown   Family Status:  Family Status  Relation Name Status  . Mother  Deceased  . Father  Deceased  . MGM  Deceased  . MGF  Deceased  . PGM  Deceased  . PGF  Deceased  . Neg Hx  (Not Specified)    ROS:  Please see the history of present illness.  All other ROS reviewed and negative.     Physical Exam/Data:   Vitals:   12/23/17 0833 12/23/17 0910 12/23/17 1200 12/23/17 1210  BP: (!) 133/92 (!) 133/100 113/77   Pulse: (!) 127     Resp:  20 (!) 22   Temp:    98.4 F (36.9 C)  TempSrc:    Oral  SpO2:  98% 98%   Weight:      Height:        Intake/Output Summary (Last 24 hours) at  12/23/2017 1401 Last data filed at 12/23/2017 1000 Gross per 24 hour  Intake 2290 ml  Output 400 ml  Net 1890 ml   Filed Weights   12/20/17 1238  Weight: 99.8 kg (220 lb)   Body mass index is 32.49 kg/m.  General:  Well nourished, well  developed, male in no acute distress HEENT: normal Lymph: no adenopathy Neck: minimal JVD Endocrine:  No thryomegaly Vascular: No carotid bruits; 4/4 extremity pulses 2+, without bruits  Cardiac:  normal S1, S2; Rapid and irreg R&R; soft murmur  Lungs:  clear to auscultation bilaterally, no wheezing, rhonchi or rales  Abd: soft, nontender, no hepatomegaly  Ext: no edema Musculoskeletal:  No deformities, BUE and BLE strength normal and equal Skin: warm and dry  Neuro:  CNs 2-12 intact, no focal abnormalities noted Psych:  Normal affect   EKG:  The EKG was personally reviewed and demonstrates:  12/28 at 12:56, SR, HR 89, PACs;; 12/28 16:20, atrial flutter vs coarse a fib w/ HR 129 Telemetry:  Telemetry was personally reviewed and demonstrates:  Atrial fib, RVR since admission  Relevant CV Studies:  ECHO: 02/27/2016  Nuclear stress EF: 39%. Inferior wall akinesis. Dilated left ventricle.  Defect 1: There is a small defect of mild severity present in the apex location.  There was no ST segment deviation noted during stress.  This is an intermediate risk study, based upon reduced ejection fraction. No ischemia identified.   MYOVIEW: 10/29/2016  Nuclear stress EF: 39%. Inferior wall akinesis. Dilated left ventricle.  Defect 1: There is a small defect of mild severity present in the apex location.  There was no ST segment deviation noted during stress.  This is an intermediate risk study, based upon reduced ejection fraction. No ischemia identified.  CATH: None since moving to [place] in 2013  Laboratory Data:  Chemistry Recent Labs  Lab 12/20/17 1450 12/21/17 0700 12/23/17 0401  NA 132* 134* 135  K 4.3 3.8 3.8  CL 96* 103 107  CO2 25 22 22   GLUCOSE 83 122* 92  BUN 36* 26* 15  CREATININE 1.64* 1.15 0.90  CALCIUM 9.2 8.1* 8.2*  GFRNONAA 41* >60 >60  GFRAA 48* >60 >60  ANIONGAP 11 9 6     Total Protein  Date Value Ref Range Status  12/21/2017 6.9 6.5 - 8.1 g/dL Final    Albumin  Date Value Ref Range Status  12/21/2017 2.8 (L) 3.5 - 5.0 g/dL Final   AST  Date Value Ref Range Status  12/21/2017 19 15 - 41 U/L Final   ALT  Date Value Ref Range Status  12/21/2017 11 (L) 17 - 63 U/L Final   Alkaline Phosphatase  Date Value Ref Range Status  12/21/2017 90 38 - 126 U/L Final   Total Bilirubin  Date Value Ref Range Status  12/21/2017 1.2 0.3 - 1.2 mg/dL Final   Hematology Recent Labs  Lab 12/20/17 1450 12/21/17 0700 12/23/17 0401  WBC 15.2* 11.7* 7.8  RBC 5.21 4.91 4.80  HGB 16.5 15.4 14.8  HCT 47.3 44.6 44.1  MCV 90.8 90.8 91.9  MCH 31.7 31.4 30.8  MCHC 34.9 34.5 33.6  RDW 13.8 13.7 13.7  PLT 278 194 235   TSH:  Lab Results  Component Value Date   TSH 9.42 (H) 11/27/2017   Lipids: Lab Results  Component Value Date   CHOL 158 07/16/2017   HDL 51.90 07/16/2017   LDLCALC 90 07/16/2017   TRIG 82.0 07/16/2017   CHOLHDL 3 07/16/2017  HgbA1c: Lab Results  Component Value Date   HGBA1C 5.4 11/27/2017    Radiology/Studies:  No results found.  Assessment and Plan:   1. Persistent atrial fib, RVR/atrial flutter RVR - No sx, but HR is high and sustained for several days, despite continuing his normal amio at 200 mg qd (has been taking this over a year) - feel current arrhythmia due to acute illness, but it has not improved  - Coreg 3.125 mg bid increased to 12.5 mg bid>>no sig improvement - he was on Cardizem gtt after admission, this was d/c'd 12/30 - continue Savaysa - make NPO after mn, try to do DCCV in am, if cannot, schedule for Weds.  2. Chronic anticoagulation - he was compliant with the Savaysa pta, has not missed a dose since in-hospital.  3. Chronic systolic CHF - need daily weights, I/O - volume status good by exam  Otherwise, per IM, afib is currently main thing keeping him in-hospital Principal Problem:   UTI (urinary tract infection) Active Problems:   Chronic systolic CHF (congestive heart failure),  NYHA class 2 (HCC)   Persistent atrial fibrillation with rapid ventricular response ([place])     For questions or updates, please contact [facility] Please consult [email or web address] for contact info under Cardiology/STEMI.   Signed, Rosaria Ferries, PA-C  12/23/2017 2:01 PM   Attending Note:   The patient was seen and examined.  Agree with assessment and plan as noted above.  Changes made to the above note as needed.  Patient seen and independently examined with Rosaria Ferries, PA.   We discussed all aspects of the encounter. I agree with the assessment and plan as stated above.  1.  Rapid atrial fibrillation/atrial flutter: Patient continues to have rapid atrial fibrillation.  I suspect this is from his urinary tract infection.  He has a known history of paroxysmal atrial fibrillation and has been continued his anticoagulation.  We will start him on IV diltiazem today to see if we can get him to convert.  I will try to schedule him for a cardioversion tomorrow although this may be difficult since it is a holiday.  Otherwise we will schedule him for cardioversion on Wednesday.  2.  Chronic systolic congestive heart failure: Continue current medications.     I have spent a total of 40 minutes with patient reviewing hospital  notes , telemetry, EKGs, labs and examining patient as well as establishing an assessment and plan that was discussed with the patient. > 50% of time was spent in direct patient care.    [name], [name], MD, [facility] 12/23/2017, 2:24 PM [address],  [place] Pager [phone]

## 2017-12-23 NOTE — Plan of Care (Signed)
  Nutrition: Adequate nutrition will be maintained 12/23/2017 1028 - Progressing by Dorene Sorrow, RN   Elimination: Will not experience complications related to bowel motility 12/23/2017 1028 - Progressing by Dorene Sorrow, RN   Pain Managment: General experience of comfort will improve 12/23/2017 1028 - Progressing by Dorene Sorrow, RN

## 2017-12-23 NOTE — Progress Notes (Signed)
[facility]  PROGRESS NOTE  [facility] EXB:[id number] DOB: 23-Nov-1946 DOA: 12/20/2017 PCP: [name], MD   Brief HPI:   71 y.o. male, with history of ischemic cardiomyopathy status post AICD, EF 87 - 55%, as per echo from 4/18, chronic atrial fibrillation, stroke, seizure disorder, hypertension came to ED after patient fell in the bathroom. Patient denies passing out. As per patient he tripped in the bathroom. He denies chest pain or shortness of breath. No nausea vomiting or diarrhea. He does have a history of seizures. And is on Keppra and phenytoin at home.   Subjective   Patient seen and examined, heart rate still not controlled. Coreg dose was changed yesterday to 12.5 mg PO BID.   Assessment/Plan:     1. Sepsis due to UTI-  sepsis physiology has resolved, WBC down to 7.8,  patient was started on ceftriaxone per pharmacy consultation, urine culture going Serratia marcescens sensitive to ceftriaxone, continue ceftriaxone. 2. Atrial fibrillation with RVR- likely exacerbated by UTI, continue IV normal saline. Will continue amiodarone, Coreg. Patient is off Cardizem drip.  Heart rate in 120s.  Dose of Coreg changed to 12.5 mg PO BID.. If no improvement will start cardizem infusion. Chads risk score is 5, continue anticoagulation with Savasya. Was started on Cardizem, which has been discontinued. 3. Hypertension-blood pressure is elevated, will restart home medications including Bidil, Cozaar 4. Seizure disorder- continue Keppra, phenytoin.  5. Ischemic cardiomyopathy- status post ICD Medtronic, continue Coreg, restart BiDil and losartan  6. Factor VII deficiency with prior DVTs- continue anticoagulation with Savasya.    DVT prophylaxis: Edoxaban  Code Status: Full code  Family Communication: No family at bedside  Disposition Plan: TBD   Consultants:  None   Procedures:  None   Continuous infusions . sodium chloride 100 mL/hr at 12/23/17 1026   . cefTRIAXone (ROCEPHIN)  IV 1 g (12/22/17 1707)      Antibiotics:   Anti-infectives (From admission, onward)   Start     Dose/Rate Route Frequency Ordered Stop   12/21/17 1800  cefTRIAXone (ROCEPHIN) 1 g in dextrose 5 % 50 mL IVPB     1 g 100 mL/hr over 30 Minutes Intravenous Every 24 hours 12/20/17 1843     12/20/17 1615  cefTRIAXone (ROCEPHIN) 2 g in dextrose 5 % 50 mL IVPB     2 g 100 mL/hr over 30 Minutes Intravenous  Once 12/20/17 1615 12/20/17 1823   12/20/17 1545  cefTRIAXone (ROCEPHIN) 1 g in dextrose 5 % 50 mL IVPB  Status:  Discontinued     1 g 100 mL/hr over 30 Minutes Intravenous  Once 12/20/17 1541 12/20/17 1614       Objective   Vitals:   12/23/17 0725 12/23/17 0800 12/23/17 0833 12/23/17 0910  BP:  (!) 133/92 (!) 133/92 (!) 133/100  Pulse:   (!) 127   Resp:  (!) 22  20  Temp: 98.7 F (37.1 C)     TempSrc: Oral     SpO2:  99%  98%  Weight:      Height:        Intake/Output Summary (Last 24 hours) at 12/23/2017 1058 Last data filed at 12/23/2017 1000 Gross per 24 hour  Intake 2890 ml  Output 580 ml  Net 2310 ml   Filed Weights   12/20/17 1238  Weight: 99.8 kg (220 lb)     Physical Examination:  Physical Exam: Eyes: No icterus, extraocular muscles intact  Mouth: Oral mucosa is moist,  no lesions on palate,  Neck: Supple, no deformities, masses, or tenderness Lungs: Normal respiratory effort, bilateral clear to auscultation, no crackles or wheezes.  Heart: irregular rhythm, S1 and S2 normal, no murmurs, rubs auscultated Abdomen: BS normoactive,soft,nondistended,non-tender to palpation,no organomegaly Extremities: No pretibial edema, no erythema, no cyanosis, no clubbing Neuro : Alert and oriented to time, place and person, No focal deficits Skin: No rashes seen on exam     Data Reviewed: I have personally reviewed following labs and imaging studies  CBG: No results for input(s): GLUCAP in the last 168 hours.  CBC: Recent Labs  Lab  12/20/17 1450 12/21/17 0700 12/23/17 0401  WBC 15.2* 11.7* 7.8  NEUTROABS 12.8*  --   --   HGB 16.5 15.4 14.8  HCT 47.3 44.6 44.1  MCV 90.8 90.8 91.9  PLT 278 194 710    Basic Metabolic Panel: Recent Labs  Lab 12/20/17 1450 12/21/17 0700 12/23/17 0401  NA 132* 134* 135  K 4.3 3.8 3.8  CL 96* 103 107  CO2 25 22 22   GLUCOSE 83 122* 92  BUN 36* 26* 15  CREATININE 1.64* 1.15 0.90  CALCIUM 9.2 8.1* 8.2*    Recent Results (from the past 240 hour(s))  Urine Culture     Status: Abnormal   Collection Time: 12/20/17  1:30 PM  Result Value Ref Range Status   Specimen Description URINE, CLEAN CATCH  Final   Special Requests NONE  Final   Culture >=100,000 COLONIES/mL SERRATIA MARCESCENS (A)  Final   Report Status 12/22/2017 FINAL  Final   Organism ID, Bacteria SERRATIA MARCESCENS (A)  Final      Susceptibility   Serratia marcescens - MIC*    CEFAZOLIN >=64 RESISTANT Resistant     CEFTRIAXONE <=1 SENSITIVE Sensitive     CIPROFLOXACIN <=0.25 SENSITIVE Sensitive     GENTAMICIN <=1 SENSITIVE Sensitive     NITROFURANTOIN 256 RESISTANT Resistant     TRIMETH/SULFA <=20 SENSITIVE Sensitive     * >=100,000 COLONIES/mL SERRATIA MARCESCENS  MRSA PCR Screening     Status: None   Collection Time: 12/20/17  6:25 PM  Result Value Ref Range Status   MRSA by PCR NEGATIVE NEGATIVE Final    Comment:        The GeneXpert MRSA Assay (FDA approved for NASAL specimens only), is one component of a comprehensive MRSA colonization surveillance program. It is not intended to diagnose MRSA infection nor to guide or monitor treatment for MRSA infections.   Blood Culture (routine x 2)     Status: None (Preliminary result)   Collection Time: 12/20/17  7:59 PM  Result Value Ref Range Status   Specimen Description BLOOD RIGHT ANTECUBITAL  Final   Special Requests   Final    BOTTLES DRAWN AEROBIC AND ANAEROBIC Blood Culture adequate volume   Culture   Final    NO GROWTH 2 DAYS Performed at  [facility], [place] [address] [place], [place] [address]    Report Status PENDING  Incomplete  Blood Culture (routine x 2)     Status: None (Preliminary result)   Collection Time: 12/20/17  8:22 PM  Result Value Ref Range Status   Specimen Description BLOOD LEFT HAND  Final   Special Requests IN PEDIATRIC BOTTLE Blood Culture adequate volume  Final   Culture   Final    NO GROWTH 2 DAYS Performed at [facility], [place] [address], [place], [place] [address]    Report Status PENDING  Incomplete     Liver Function Tests: Recent Labs  Lab 12/20/17 1450 12/21/17 0700  AST 26 19  ALT 15* 11*  ALKPHOS 118 90  BILITOT 1.6* 1.2  PROT 9.0* 6.9  ALBUMIN 3.8 2.8*   No results for input(s): LIPASE, AMYLASE in the last 168 hours. No results for input(s): AMMONIA in the last 168 hours.  Cardiac Enzymes: No results for input(s): CKTOTAL, CKMB, CKMBINDEX, TROPONINI in the last 168 hours. BNP (last 3 results) Recent Labs    09/08/17 1741  BNP 108.7*    ProBNP (last 3 results) Recent Labs    11/27/17 0932  PROBNP 50.0      Studies: No results found.  Scheduled Meds: . amiodarone  200 mg Oral Daily  . carvedilol  12.5 mg Oral BID WC  . DULoxetine  30 mg Oral Daily  . edoxaban  60 mg Oral Daily  . ferrous sulfate  325 mg Oral BID WC  . furosemide  40 mg Oral Daily  . isosorbide-hydrALAZINE  2 tablet Oral TID  . levETIRAcetam  1,000 mg Oral BID  . losartan  25 mg Oral Daily  . phenytoin  300 mg Oral QHS  . senna-docusate  2 tablet Oral BID  . simvastatin  20 mg Oral q1800  . spironolactone  12.5 mg Oral Daily      Time spent: 25 min  [facility] Pager [phone]. If 7PM-7AM, please contact night-coverage at [email or web address], Office  [phone]  password [place]  12/23/2017, 10:58 AM  LOS: 3 days

## 2017-12-24 MED ORDER — SODIUM CHLORIDE 0.9% FLUSH: 3.0000 mL | INTRAVENOUS | Status: DC | PRN

## 2017-12-24 MED ORDER — SODIUM CHLORIDE 0.9% FLUSH
3.0000 mL | Freq: Two times a day (BID) | INTRAVENOUS | Status: DC
  Administered 2017-12-24 – 2017-12-28 (×7): 3 mL via INTRAVENOUS

## 2017-12-24 MED ORDER — SODIUM CHLORIDE 0.9 % IV SOLN: 250.0000 mL | INTRAVENOUS | Status: DC

## 2017-12-24 NOTE — Progress Notes (Signed)
[facility]  PROGRESS NOTE  [facility]. PPI:[id number] DOB: Feb 07, 1946 DOA: 12/20/2017 PCP: [name], MD   Brief HPI:   72 y.o. male, with history of ischemic cardiomyopathy status post AICD, EF 25 - 55%, as per echo from 4/18, chronic atrial fibrillation, stroke, seizure disorder, hypertension came to ED after patient fell in the bathroom. Patient denies passing out. As per patient he tripped in the bathroom. He denies chest pain or shortness of breath. No nausea vomiting or diarrhea. He does have a history of seizures. And is on Keppra and phenytoin at home.   Subjective   Patient seen and examined, he was seen by cardiology yesterday. Plan for cardioversion tomorrow. Started on IV Cardizem 5 mg per hour.   Assessment/Plan:     1. Sepsis due to UTI-  sepsis physiology has resolved, WBC down to 7.8,  patient was started on ceftriaxone per pharmacy consultation, urine culture going Serratia marcescens sensitive to ceftriaxone, continue ceftriaxone. 2. Atrial fibrillation with RVR- likely exacerbated by UTI, continue IV normal saline. Will continue amiodarone, Coreg. Patient is on Cardizem drip.  Heart rate in 120s.  Dose of Coreg changed to 12.5 mg PO BID. Chads risk score is 5, continue anticoagulation with Savasya. Patient was seen by cardiology. Plan for cardioversion in a.m. 3. Hypertension-blood pressure is elevated, will restart home medications including Bidil, Cozaar 4. Seizure disorder- continue Keppra, phenytoin.  5. Ischemic cardiomyopathy- status post ICD [employer], continue Coreg, restart BiDil and losartan  6. Factor VII deficiency with prior DVTs- continue anticoagulation with Savasya.    DVT prophylaxis: Edoxaban  Code Status: Full code  Family Communication: No family at bedside  Disposition Plan: TBD   Consultants:  None   Procedures:  None   Continuous infusions . sodium chloride 100 mL/hr at 12/24/17 1200  . sodium chloride     . cefTRIAXone (ROCEPHIN)  IV 1 g (12/23/17 1827)  . diltiazem (CARDIZEM) infusion 5 mg/hr (12/24/17 1200)      Antibiotics:   Anti-infectives (From admission, onward)   Start     Dose/Rate Route Frequency Ordered Stop   12/21/17 1800  cefTRIAXone (ROCEPHIN) 1 g in dextrose 5 % 50 mL IVPB     1 g 100 mL/hr over 30 Minutes Intravenous Every 24 hours 12/20/17 1843     12/20/17 1615  cefTRIAXone (ROCEPHIN) 2 g in dextrose 5 % 50 mL IVPB     2 g 100 mL/hr over 30 Minutes Intravenous  Once 12/20/17 1615 12/20/17 1823   12/20/17 1545  cefTRIAXone (ROCEPHIN) 1 g in dextrose 5 % 50 mL IVPB  Status:  Discontinued     1 g 100 mL/hr over 30 Minutes Intravenous  Once 12/20/17 1541 12/20/17 1614       Objective   Vitals:   12/24/17 1100 12/24/17 1140 12/24/17 1200 12/24/17 1400  BP: (!) 104/59  (!) 147/75 112/84  Pulse:      Resp: 15  13 (!) 25  Temp:  98.5 F (36.9 C)    TempSrc:  Oral    SpO2: 99%  95% 96%  Weight:      Height:        Intake/Output Summary (Last 24 hours) at 12/24/2017 1603 Last data filed at 12/24/2017 1200 Gross per 24 hour  Intake 3230 ml  Output 520 ml  Net 2710 ml   Filed Weights   12/20/17 1238  Weight: 99.8 kg (220 lb)     Physical Examination:  Physical Exam: Eyes:  No icterus, extraocular muscles intact  Mouth: Oral mucosa is moist, no lesions on palate,  Neck: Supple, no deformities, masses, or tenderness Lungs: Normal respiratory effort, bilateral clear to auscultation, no crackles or wheezes.  Heart: Irregular rhythm, S1 and S2 normal, no murmurs, rubs auscultated Abdomen: BS normoactive,soft,nondistended,non-tender to palpation,no organomegaly Extremities: No pretibial edema, no erythema, no cyanosis, no clubbing Neuro : Alert and oriented to time, place and person, No focal deficits    Data Reviewed: I have personally reviewed following labs and imaging studies  CBG: No results for input(s): GLUCAP in the last 168  hours.  CBC: Recent Labs  Lab 12/20/17 1450 12/21/17 0700 12/23/17 0401  WBC 15.2* 11.7* 7.8  NEUTROABS 12.8*  --   --   HGB 16.5 15.4 14.8  HCT 47.3 44.6 44.1  MCV 90.8 90.8 91.9  PLT 278 194 161    Basic Metabolic Panel: Recent Labs  Lab 12/20/17 1450 12/21/17 0700 12/23/17 0401  NA 132* 134* 135  K 4.3 3.8 3.8  CL 96* 103 107  CO2 25 22 22   GLUCOSE 83 122* 92  BUN 36* 26* 15  CREATININE 1.64* 1.15 0.90  CALCIUM 9.2 8.1* 8.2*    Recent Results (from the past 240 hour(s))  Urine Culture     Status: Abnormal   Collection Time: 12/20/17  1:30 PM  Result Value Ref Range Status   Specimen Description URINE, CLEAN CATCH  Final   Special Requests NONE  Final   Culture >=100,000 COLONIES/mL SERRATIA MARCESCENS (A)  Final   Report Status 12/22/2017 FINAL  Final   Organism ID, Bacteria SERRATIA MARCESCENS (A)  Final      Susceptibility   Serratia marcescens - MIC*    CEFAZOLIN >=64 RESISTANT Resistant     CEFTRIAXONE <=1 SENSITIVE Sensitive     CIPROFLOXACIN <=0.25 SENSITIVE Sensitive     GENTAMICIN <=1 SENSITIVE Sensitive     NITROFURANTOIN 256 RESISTANT Resistant     TRIMETH/SULFA <=20 SENSITIVE Sensitive     * >=100,000 COLONIES/mL SERRATIA MARCESCENS  MRSA PCR Screening     Status: None   Collection Time: 12/20/17  6:25 PM  Result Value Ref Range Status   MRSA by PCR NEGATIVE NEGATIVE Final    Comment:        The GeneXpert MRSA Assay (FDA approved for NASAL specimens only), is one component of a comprehensive MRSA colonization surveillance program. It is not intended to diagnose MRSA infection nor to guide or monitor treatment for MRSA infections.   Blood Culture (routine x 2)     Status: None (Preliminary result)   Collection Time: 12/20/17  7:59 PM  Result Value Ref Range Status   Specimen Description BLOOD RIGHT ANTECUBITAL  Final   Special Requests   Final    BOTTLES DRAWN AEROBIC AND ANAEROBIC Blood Culture adequate volume   Culture   Final     NO GROWTH 4 DAYS Performed at [facility], [place] [address], [place], [place] [address]    Report Status PENDING  Incomplete  Blood Culture (routine x 2)     Status: None (Preliminary result)   Collection Time: 12/20/17  8:22 PM  Result Value Ref Range Status   Specimen Description BLOOD LEFT HAND  Final   Special Requests IN PEDIATRIC BOTTLE Blood Culture adequate volume  Final   Culture   Final    NO GROWTH 4 DAYS Performed at [facility], [place] [address] [place], [place] [address]  Report Status PENDING  Incomplete     Liver Function Tests: Recent Labs  Lab 12/20/17 1450 12/21/17 0700  AST 26 19  ALT 15* 11*  ALKPHOS 118 90  BILITOT 1.6* 1.2  PROT 9.0* 6.9  ALBUMIN 3.8 2.8*   No results for input(s): LIPASE, AMYLASE in the last 168 hours. No results for input(s): AMMONIA in the last 168 hours.  Cardiac Enzymes: No results for input(s): CKTOTAL, CKMB, CKMBINDEX, TROPONINI in the last 168 hours. BNP (last 3 results) Recent Labs    09/08/17 1741  BNP 108.7*    ProBNP (last 3 results) Recent Labs    11/27/17 0932  PROBNP 50.0      Studies: No results found.  Scheduled Meds: . amiodarone  200 mg Oral Daily  . carvedilol  12.5 mg Oral BID WC  . diltiazem  5 mg Intravenous Once  . DULoxetine  30 mg Oral Daily  . edoxaban  60 mg Oral Daily  . ferrous sulfate  325 mg Oral BID WC  . furosemide  40 mg Oral Daily  . isosorbide-hydrALAZINE  2 tablet Oral TID  . levETIRAcetam  1,000 mg Oral BID  . losartan  25 mg Oral Daily  . phenytoin  300 mg Oral QHS  . senna-docusate  2 tablet Oral BID  . simvastatin  20 mg Oral q1800  . sodium chloride flush  3 mL Intravenous Q12H  . spironolactone  12.5 mg Oral Daily      Time spent: 25 min  [facility] Pager [phone]. If 7PM-7AM, please contact night-coverage at [email or web address], Office  [phone]  password [email or web address]  12/24/2017, 4:03 PM  LOS: 4 days

## 2017-12-24 NOTE — Progress Notes (Signed)
Progress Note  Patient Name: Louis Ford. Date of Encounter: 12/24/2017  Primary Cardiologist:  [name]   Subjective   72 year old gentleman with a history of congestive heart failure, coronary artery disease, factor VII deficiency, and atrial fibrillation.  He was admitted with a urinary tract infection and has developed persistent atrial fibrillation.  Is currently on Cardizem drip.  His heart rate is well controlled.  He is stable.    HR is better. Will schedule cardioversion for tomorrow   Inpatient Medications    Scheduled Meds: . amiodarone  200 mg Oral Daily  . carvedilol  12.5 mg Oral BID WC  . diltiazem  5 mg Intravenous Once  . DULoxetine  30 mg Oral Daily  . edoxaban  60 mg Oral Daily  . ferrous sulfate  325 mg Oral BID WC  . furosemide  40 mg Oral Daily  . isosorbide-hydrALAZINE  2 tablet Oral TID  . levETIRAcetam  1,000 mg Oral BID  . losartan  25 mg Oral Daily  . phenytoin  300 mg Oral QHS  . senna-docusate  2 tablet Oral BID  . simvastatin  20 mg Oral q1800  . spironolactone  12.5 mg Oral Daily   Continuous Infusions: . sodium chloride 100 mL/hr at 12/23/17 2117  . cefTRIAXone (ROCEPHIN)  IV 1 g (12/23/17 1827)  . diltiazem (CARDIZEM) infusion 5 mg/hr (12/24/17 0446)   PRN Meds: acetaminophen, ondansetron **OR** ondansetron (ZOFRAN) IV   Vital Signs    Vitals:   12/24/17 0500 12/24/17 0600 12/24/17 0700 12/24/17 0725  BP: (!) 132/101 120/73 129/76   Pulse:      Resp: 14 13 (!) 0   Temp:    97.9 F (36.6 C)  TempSrc:    Oral  SpO2: 96% 99% 100%   Weight:      Height:        Intake/Output Summary (Last 24 hours) at 12/24/2017 0821 Last data filed at 12/24/2017 0500 Gross per 24 hour  Intake 2433.83 ml  Output 1170 ml  Net 1263.83 ml   Filed Weights   12/20/17 1238  Weight: 220 lb (99.8 kg)    Telemetry    Atrial fib with HR in 89s-90s - Personally Reviewed  ECG    Atrial fib  - Personally Reviewed  Physical Exam   GEN: No  acute distress.   Neck: No JVD Cardiac:  Irregularly irregular Respiratory: Clear to auscultation bilaterally. GI: Soft, nontender, non-distended  MS: No edema; No deformity. Neuro:  Nonfocal  Psych: Normal affect   Labs    Chemistry Recent Labs  Lab 12/20/17 1450 12/21/17 0700 12/23/17 0401  NA 132* 134* 135  K 4.3 3.8 3.8  CL 96* 103 107  CO2 25 22 22   GLUCOSE 83 122* 92  BUN 36* 26* 15  CREATININE 1.64* 1.15 0.90  CALCIUM 9.2 8.1* 8.2*  PROT 9.0* 6.9  --   ALBUMIN 3.8 2.8*  --   AST 26 19  --   ALT 15* 11*  --   ALKPHOS 118 90  --   BILITOT 1.6* 1.2  --   GFRNONAA 41* >60 >60  GFRAA 48* >60 >60  ANIONGAP 11 9 6      Hematology Recent Labs  Lab 12/20/17 1450 12/21/17 0700 12/23/17 0401  WBC 15.2* 11.7* 7.8  RBC 5.21 4.91 4.80  HGB 16.5 15.4 14.8  HCT 47.3 44.6 44.1  MCV 90.8 90.8 91.9  MCH 31.7 31.4 30.8  MCHC 34.9 34.5 33.6  RDW  13.8 13.7 13.7  PLT 278 194 235    Cardiac EnzymesNo results for input(s): TROPONINI in the last 168 hours. No results for input(s): TROPIPOC in the last 168 hours.   BNPNo results for input(s): BNP, PROBNP in the last 168 hours.   DDimer No results for input(s): DDIMER in the last 168 hours.   Radiology    No results found.  Cardiac Studies     Patient Profile     72 y.o. male with  coronary artery disease, chronic systolic congestive heart failure admitted with urinary tract infection.  He developed atrial fibrillation.  Assessment & Plan    1.  Atrial fibrillation: His heart rate is well controlled on IV diltiazem 5 mg an hour.  We will continue the IV diltiazem for now.  We will schedule him for cardioversion tomorrow.  2.  Coronary artery disease-stable 3.  Chronic systolic congestive heart failure: Stable  For questions or updates, please contact [place] Please consult [email or web address] for contact info under Cardiology/STEMI.      Signed, Mertie Moores, MD  12/24/2017, 8:21 AM

## 2017-12-24 NOTE — H&P (View-Only) (Signed)
Progress Note  Patient Name: [facility]. Date of Encounter: 12/24/2017  Primary Cardiologist:  [name]   Subjective   72 year old gentleman with a history of congestive heart failure, coronary artery disease, factor VII deficiency, and atrial fibrillation.  He was admitted with a urinary tract infection and has developed persistent atrial fibrillation.  Is currently on Cardizem drip.  His heart rate is well controlled.  He is stable.    HR is better. Will schedule cardioversion for tomorrow   Inpatient Medications    Scheduled Meds: . amiodarone  200 mg Oral Daily  . carvedilol  12.5 mg Oral BID WC  . diltiazem  5 mg Intravenous Once  . DULoxetine  30 mg Oral Daily  . edoxaban  60 mg Oral Daily  . ferrous sulfate  325 mg Oral BID WC  . furosemide  40 mg Oral Daily  . isosorbide-hydrALAZINE  2 tablet Oral TID  . levETIRAcetam  1,000 mg Oral BID  . losartan  25 mg Oral Daily  . phenytoin  300 mg Oral QHS  . senna-docusate  2 tablet Oral BID  . simvastatin  20 mg Oral q1800  . spironolactone  12.5 mg Oral Daily   Continuous Infusions: . sodium chloride 100 mL/hr at 12/23/17 2117  . cefTRIAXone (ROCEPHIN)  IV 1 g (12/23/17 1827)  . diltiazem (CARDIZEM) infusion 5 mg/hr (12/24/17 0446)   PRN Meds: acetaminophen, ondansetron **OR** ondansetron (ZOFRAN) IV   Vital Signs    Vitals:   12/24/17 0500 12/24/17 0600 12/24/17 0700 12/24/17 0725  BP: (!) 132/101 120/73 129/76   Pulse:      Resp: 14 13 (!) 0   Temp:    97.9 F (36.6 C)  TempSrc:    Oral  SpO2: 96% 99% 100%   Weight:      Height:        Intake/Output Summary (Last 24 hours) at 12/24/2017 0821 Last data filed at 12/24/2017 0500 Gross per 24 hour  Intake 2433.83 ml  Output 1170 ml  Net 1263.83 ml   Filed Weights   12/20/17 1238  Weight: 220 lb (99.8 kg)    Telemetry    Atrial fib with HR in 89s-90s - Personally Reviewed  ECG    Atrial fib  - Personally Reviewed  Physical Exam   GEN: No  acute distress.   Neck: No JVD Cardiac:  Irregularly irregular Respiratory: Clear to auscultation bilaterally. GI: Soft, nontender, non-distended  MS: No edema; No deformity. Neuro:  Nonfocal  Psych: Normal affect   Labs    Chemistry Recent Labs  Lab 12/20/17 1450 12/21/17 0700 12/23/17 0401  NA 132* 134* 135  K 4.3 3.8 3.8  CL 96* 103 107  CO2 25 22 22   GLUCOSE 83 122* 92  BUN 36* 26* 15  CREATININE 1.64* 1.15 0.90  CALCIUM 9.2 8.1* 8.2*  PROT 9.0* 6.9  --   ALBUMIN 3.8 2.8*  --   AST 26 19  --   ALT 15* 11*  --   ALKPHOS 118 90  --   BILITOT 1.6* 1.2  --   GFRNONAA 41* >60 >60  GFRAA 48* >60 >60  ANIONGAP 11 9 6      Hematology Recent Labs  Lab 12/20/17 1450 12/21/17 0700 12/23/17 0401  WBC 15.2* 11.7* 7.8  RBC 5.21 4.91 4.80  HGB 16.5 15.4 14.8  HCT 47.3 44.6 44.1  MCV 90.8 90.8 91.9  MCH 31.7 31.4 30.8  MCHC 34.9 34.5 33.6  RDW  13.8 13.7 13.7  PLT 278 194 235    Cardiac EnzymesNo results for input(s): TROPONINI in the last 168 hours. No results for input(s): TROPIPOC in the last 168 hours.   BNPNo results for input(s): BNP, PROBNP in the last 168 hours.   DDimer No results for input(s): DDIMER in the last 168 hours.   Radiology    No results found.  Cardiac Studies     Patient Profile     72 y.o. male with  coronary artery disease, chronic systolic congestive heart failure admitted with urinary tract infection.  He developed atrial fibrillation.  Assessment & Plan    1.  Atrial fibrillation: His heart rate is well controlled on IV diltiazem 5 mg an hour.  We will continue the IV diltiazem for now.  We will schedule him for cardioversion tomorrow.  2.  Coronary artery disease-stable 3.  Chronic systolic congestive heart failure: Stable  For questions or updates, please contact [place] Please consult [email or web address] for contact info under Cardiology/STEMI.      Signed, Mertie Moores, MD  12/24/2017, 8:21 AM

## 2017-12-25 ENCOUNTER — Inpatient Hospital Stay (HOSPITAL_COMMUNITY): Payer: Medicare Other | Admitting: Anesthesiology

## 2017-12-25 ENCOUNTER — Encounter (HOSPITAL_COMMUNITY)
Admission: EM | Disposition: A | Discharge status: Home Health | Payer: Self-pay | Source: Home / Self Care | Attending: Family Medicine

## 2017-12-25 HISTORY — PX: CARDIOVERSION: SHX1299

## 2017-12-25 LAB — CULTURE, BLOOD (ROUTINE X 2)
CULTURE: NO GROWTH
CULTURE: NO GROWTH
SPECIAL REQUESTS: ADEQUATE
SPECIAL REQUESTS: ADEQUATE

## 2017-12-25 SURGERY — CARDIOVERSION
Anesthesia: General

## 2017-12-25 MED ORDER — LIDOCAINE 2% (20 MG/ML) 5 ML SYRINGE
INTRAMUSCULAR | Status: DC | PRN
  Administered 2017-12-25: 40 mg via INTRAVENOUS

## 2017-12-25 MED ORDER — SODIUM CHLORIDE 0.9 % IV SOLN
INTRAVENOUS | Status: DC | PRN
  Administered 2017-12-25: 09:00:00 via INTRAVENOUS

## 2017-12-25 MED ORDER — MEPERIDINE HCL 25 MG/ML IJ SOLN: 6.2500 mg | INTRAMUSCULAR | Status: DC | PRN

## 2017-12-25 MED ORDER — MIDAZOLAM HCL 2 MG/2ML IJ SOLN: 0.5000 mg | Freq: Once | INTRAMUSCULAR | Status: DC | PRN

## 2017-12-25 MED ORDER — PROMETHAZINE HCL 25 MG/ML IJ SOLN: 6.2500 mg | INTRAMUSCULAR | Status: DC | PRN

## 2017-12-25 MED ORDER — PROPOFOL 10 MG/ML IV BOLUS
INTRAVENOUS | Status: DC | PRN
  Administered 2017-12-25: 80 mg via INTRAVENOUS

## 2017-12-25 MED ORDER — PROPOFOL 10 MG/ML IV BOLUS
INTRAVENOUS | Status: AC
  Filled 2017-12-25: qty 20

## 2017-12-25 MED ORDER — LIDOCAINE 2% (20 MG/ML) 5 ML SYRINGE
INTRAMUSCULAR | Status: AC
  Filled 2017-12-25: qty 5

## 2017-12-25 NOTE — Progress Notes (Signed)
PT Cancellation Note  Patient Details Name: [facility]. MRN: [id number] DOB: 11/04/1946   Cancelled Treatment:    Reason Eval/Treat Not Completed: Attempted PT tx session this afternoon, pt declined to participate. Will check back another day.    [name], MPT Pager: [phone]

## 2017-12-25 NOTE — Progress Notes (Signed)
AICD rep at bedside, device interogated, print out on chart

## 2017-12-25 NOTE — Anesthesia Postprocedure Evaluation (Signed)
Anesthesia Post Note  Patient: [facility].  Procedure(s) Performed: CARDIOVERSION (N/A )     Anesthesia Post Evaluation  Last Vitals:  Vitals:   12/25/17 1030 12/25/17 1150  BP: (!) 129/95   Pulse: 88   Resp: 17   Temp:  36.9 C  SpO2: 99%     Last Pain:  Vitals:   12/25/17 1150  TempSrc: Oral  PainSc:                  Venissa Nappi,E. Trystin Hargrove

## 2017-12-25 NOTE — Progress Notes (Signed)
PT Cancellation Note  Patient Details Name: [facility]. MRN: [id number] DOB: Nov 19, 1946   Cancelled Treatment:    Reason Eval/Treat Not Completed: Patient at procedure or test/unavailable--pt is scheduled to have a cardioversion on today. Will hold PT for now.    [name], MPT Pager: [phone]

## 2017-12-25 NOTE — CV Procedure (Signed)
    Cardioversion Note  Daoud Lobue. [id number] Oct 21, 1946  Procedure: DC Cardioversion Indications: atrial fib   Procedure Details Consent: Obtained Time Out: Verified patient identification, verified procedure, site/side was marked, verified correct patient position, special equipment/implants available, Radiology Safety Procedures followed,  medications/allergies/relevent history reviewed, required imaging and test results available.  Performed  The patient has been on adequate anticoagulation.  The patient received IV Lidocaine 40 mg IV followed by Propofol 80 mg IV  for sedation.  Synchronous cardioversion was performed at 120  joules.  The cardioversion was successful     Complications: No apparent complications Patient did tolerate procedure well.   [name], [name], MD, [facility] 12/25/2017, 10:02 AM

## 2017-12-25 NOTE — Transfer of Care (Signed)
Immediate Anesthesia Transfer of Care Note  Patient: [facility].  Procedure(s) Performed: CARDIOVERSION (N/A )  Patient Location: PACU  Anesthesia Type:MAC  Level of Consciousness: awake and responds to stimulation  Airway & Oxygen Therapy: Patient Spontanous Breathing and Patient connected to nasal cannula oxygen  Post-op Assessment: Report given to RN and Post -op Vital signs reviewed and stable  Post vital signs: Reviewed and stable  Last Vitals:  Vitals:   12/25/17 0754 12/25/17 0800  BP:  116/88  Pulse:    Resp:  (!) 21  Temp: 36.8 C   SpO2:  98%    Last Pain:  Vitals:   12/25/17 0355  TempSrc: Oral  PainSc:       Patients Stated Pain Goal: 5 (43/32/95 1884)  Complications: No apparent anesthesia complications

## 2017-12-25 NOTE — Progress Notes (Signed)
Progress Note  Patient Name: Louis Ford. Date of Encounter: 12/25/2017  Primary Cardiologist: Dr. Haroldine Laws  Subjective   Pt a bit sleepy this morning. No chest pain, palpitations. Understands the need to cardioversion today. Has been NPO since MDN.   Inpatient Medications    Scheduled Meds: . amiodarone  200 mg Oral Daily  . carvedilol  12.5 mg Oral BID WC  . diltiazem  5 mg Intravenous Once  . DULoxetine  30 mg Oral Daily  . edoxaban  60 mg Oral Daily  . ferrous sulfate  325 mg Oral BID WC  . furosemide  40 mg Oral Daily  . isosorbide-hydrALAZINE  2 tablet Oral TID  . levETIRAcetam  1,000 mg Oral BID  . losartan  25 mg Oral Daily  . phenytoin  300 mg Oral QHS  . senna-docusate  2 tablet Oral BID  . simvastatin  20 mg Oral q1800  . sodium chloride flush  3 mL Intravenous Q12H  . spironolactone  12.5 mg Oral Daily   Continuous Infusions: . sodium chloride 100 mL/hr at 12/25/17 0022  . sodium chloride    . cefTRIAXone (ROCEPHIN)  IV 1 g (12/24/17 1825)  . diltiazem (CARDIZEM) infusion 5 mg/hr (12/25/17 0021)   PRN Meds: acetaminophen, ondansetron **OR** ondansetron (ZOFRAN) IV, sodium chloride flush   Vital Signs    Vitals:   12/25/17 0355 12/25/17 0400 12/25/17 0600 12/25/17 0603  BP:  119/71 (!) 129/112 (!) 131/98  Pulse:      Resp:  12 13 19   Temp: 98.5 F (36.9 C)     TempSrc: Oral     SpO2:  98% 97% 99%  Weight:      Height:        Intake/Output Summary (Last 24 hours) at 12/25/2017 0738 Last data filed at 12/25/2017 0400 Gross per 24 hour  Intake 2946.17 ml  Output 900 ml  Net 2046.17 ml   Filed Weights   12/20/17 1238  Weight: 220 lb (99.8 kg)    Physical Exam   General: Well developed, well nourished, NAD Skin: Warm, dry, intact  Head: Normocephalic, atraumatic, clear, moist mucus membranes. Neck: Negative for carotid bruits. No JVD Lungs:Clear to ausculation bilaterally. No wheezes, rales, or rhonchi. Breathing is  unlabored. Cardiovascular: Irregular with S1 S2. No murmurs, rubs, or gallops Abdomen: Soft, non-tender, non-distended with normoactive bowel sounds. No obvious abdominal masses. MSK: Strength and tone appear normal for age. 5/5 in all extremities Extremities: Mild 1+ bilateral edema. No clubbing or cyanosis. DP/PT pulses 2+ bilaterally Neuro: Alert and oriented, although sleepy this AM. No focal deficits. No facial asymmetry. MAE spontaneously. Psych: Responds to questions appropriately with normal affect.    Labs    Chemistry Recent Labs  Lab 12/20/17 1450 12/21/17 0700 12/23/17 0401  NA 132* 134* 135  K 4.3 3.8 3.8  CL 96* 103 107  CO2 25 22 22   GLUCOSE 83 122* 92  BUN 36* 26* 15  CREATININE 1.64* 1.15 0.90  CALCIUM 9.2 8.1* 8.2*  PROT 9.0* 6.9  --   ALBUMIN 3.8 2.8*  --   AST 26 19  --   ALT 15* 11*  --   ALKPHOS 118 90  --   BILITOT 1.6* 1.2  --   GFRNONAA 41* >60 >60  GFRAA 48* >60 >60  ANIONGAP 11 9 6      Hematology Recent Labs  Lab 12/20/17 1450 12/21/17 0700 12/23/17 0401  WBC 15.2* 11.7* 7.8  RBC 5.21  4.91 4.80  HGB 16.5 15.4 14.8  HCT 47.3 44.6 44.1  MCV 90.8 90.8 91.9  MCH 31.7 31.4 30.8  MCHC 34.9 34.5 33.6  RDW 13.8 13.7 13.7  PLT 278 194 235    Cardiac EnzymesNo results for input(s): TROPONINI in the last 168 hours. No results for input(s): TROPIPOC in the last 168 hours.   BNPNo results for input(s): BNP, PROBNP in the last 168 hours.   DDimer No results for input(s): DDIMER in the last 168 hours.   Radiology    No results found.  Telemetry    12/25/17 72 - Personally Reviewed  ECG     12/21/17-Afib 127 - Personally Reviewed  Cardiac Studies   Echo 02/27/16:  Study Conclusions  - Left ventricle: The cavity size was normal. Wall thickness was   increased in a pattern of mild LVH. Systolic function was normal.   The estimated ejection fraction was in the range of 55% to 60%.   Features are consistent with a pseudonormal  left ventricular   filling pattern, with concomitant abnormal relaxation and   increased filling pressure (grade 2 diastolic dysfunction). - Mitral valve: Calcified annulus. Mildly thickened leaflets .   There was mild regurgitation. Valve area by pressure half-time:   1.62 cm^2. - Left atrium: The atrium was mildly dilated. - Pulmonary arteries: PA peak pressure: 35 mm Hg (S).  10/29/16 Myoview stress test:  Nuclear stress EF: 39%. Inferior wall akinesis. Dilated left ventricle.  Defect 1: There is a small defect of mild severity present in the apex location.  There was no ST segment deviation noted during stress.  This is an intermediate risk study, based upon reduced ejection fraction. No ischemia identified.   Patient Profile     72 y.o. male with a hx of CABG (2011), MV 10/2016 w/ EF 39% and small defect but no ischemia, ICM s/p Biotronik ICD in VVI mode, chronic systolic CHF per Echo 59/74/16 LVEF 30-35% ->improved to 55-60% (3/17 echo),HTN, HLD, paroxysmal atrial fibrillation, previous stroke, Factor 7 deficiency, and seizure disorder, who is being followed by Cardiology for new onset Afib per Dr. Darrick Meigs.  Assessment & Plan    1. New onset Atrial Fibrillation r/t acute illness: -Remains on Diltiazem gtt at 5mg /hr -Continue Amiodarone 200mg  daily (on this for > 1 yr), Coreg 12.5, BP stable 131/98>129/112, 119/71>118/73 -DCCV Cardioversion today, 12/25/17, has been NPO since MDN -Pt on Edoxaban, compliant. Cr, 12/23/17>0.90 -Cr 0.90 today, appears his trend is between 1.2-1.3 -Consider changing anticoagulation given normalized kidney function   2. CAD s/p CABG (2011): -Stable, no chest pain -On BB, ARB, statin  3. Chronic systolic heart failure: -Stable, EF per echo 2017, 55-60% -Continue BB, ARB, spironolactone -Weight, 220lb -I & O, +2L today -Does not appear to be volume overloaded on exam -Continue lasix 40 daily, spironolcatone  Signed, Kathyrn Drown, NP   12/25/2017, 7:38 AM    For questions or updates, please contact   Please consult [email or web address] for contact info under Cardiology/STEMI.  Attending Note:   The patient was seen and examined.  Agree with assessment and plan as noted above.  Changes made to the above note as needed.  Patient seen and independently examined with Kathyrn Drown, NP .   We discussed all aspects of the encounter. I agree with the assessment and plan as stated above.  1.   Atrial fib:   Related to his acute illness. For cardioversion today  Has a biotronic device.  Will have the  biotronic rep help [place] with the cardioversion.    I have spent a total of 40 minutes with patient reviewing hospital  notes , telemetry, EKGs, labs and examining patient as well as establishing an assessment and plan that was discussed with the patient. > 50% of time was spent in direct patient care.  2.  CAD :   Stable    Ramond Dial., MD, [facility] 12/25/2017, 9:36 AM [address],  [place] Pager [phone]

## 2017-12-25 NOTE — Interval H&P Note (Signed)
History and Physical Interval Note:  12/25/2017 9:35 AM  [facility].  has presented today for surgery, with the diagnosis of atrial fib  The various methods of treatment have been discussed with the patient and family. After consideration of risks, benefits and other options for treatment, the patient has consented to  Procedure(s): CARDIOVERSION (N/A) as a surgical intervention .  The patient's history has been reviewed, patient examined, no change in status, stable for surgery.  I have reviewed the patient's chart and labs.  Questions were answered to the patient's satisfaction.     Mertie Moores

## 2017-12-25 NOTE — Progress Notes (Signed)
[facility]  PROGRESS NOTE  [facility]. DEY:[id number] DOB: 11-13-1946 DOA: 12/20/2017   PCP: [name], MD  Brief HPI:   72 y.o. male, with history of ischemic cardiomyopathy status post AICD, EF 79 - 55%, as per echo from 4/18, chronic atrial fibrillation, stroke, seizure disorder, hypertension came to ED after patient fell in the bathroom. Patient denies passing out. As per patient he tripped in the bathroom. He denies chest pain or shortness of breath. No nausea vomiting or diarrhea. He does have a history of seizures and is on Keppra and phenytoin at home.  Subjective   No events overnight.   Assessment/Plan:   1. Sepsis due to UTI-  sepsis physiology has resolved, WBC down to 7.8,  patient was started on ceftriaxone per pharmacy consultation, urine culture going Serratia marcescens sensitive to ceftriaxone, continue Ceftriaxone day 6/7. WBC is now WNL.  2. Atrial fibrillation with RVR- likely exacerbated by UTI, continue IV normal saline. Will continue amiodarone, Coreg. Patient is on Cardizem drip.  Heart rate in 120s.  Dose of Coreg changed to 12.5 mg PO BID. Chads risk score is 5, continue anticoagulation with Savasya. Patient was seen by cardiology. Pt underwent successful cardioversion today. Plan to transfer to tele unit and possible d/c home in 24-48 hours if pt remains stable.  3. Hypertension - reasonable inpatient control, continue home regimen with isosorbide -hydralazine, lasix, coreg, losartan  4. Seizure disorder - continue Phenytoin and Keppra  5. Ischemic cardiomyopathy- status post ICD [employer], continue Coreg, Isosorbide - Hydralazine, Losartan  6. Factor VII deficiency with prior DVTs- continue anticoagulation with Edoxaban  7. Acute kidney injury - suspect pre renal etiology, resolved   DVT prophylaxis: Edoxaban Code Status: Full code Family Communication: Pt updated at bedside  Disposition Plan: transfer to telemetry and possible d/c home  in 24-48 hours   Consultants:  Cardiology   Procedures:  Cardioversion 12/25/2017 -Synchronous cardioversion was performed at 120  joules. The cardioversion was successful   Antibiotics:   Rocephin 12/28 - 12/26/2017   Objective   Vitals:   12/25/17 1000 12/25/17 1015 12/25/17 1030 12/25/17 1150  BP: 117/86 116/89 (!) 129/95   Pulse: 80 73 88   Resp: (!) 8 20 17    Temp: 98.5 F (36.9 C) 98.5 F (36.9 C)  98.4 F (36.9 C)  TempSrc:    Oral  SpO2: 100% 100% 99%   Weight:      Height:        Intake/Output Summary (Last 24 hours) at 12/25/2017 1353 Last data filed at 12/25/2017 1007 Gross per 24 hour  Intake 2670 ml  Output 1750 ml  Net 920 ml   Filed Weights   12/20/17 1238  Weight: 99.8 kg (220 lb)    Physical Exam  Constitutional: Appears calm, NAD CVS: RRR, S1/S2 +, no murmurs, no gallops, no carotid bruit.  Pulmonary: Effort and breath sounds normal, no stridor, rhonchi, wheezes, rales.  Abdominal: Soft. BS +,  no distension, tenderness, rebound or guarding.  Musculoskeletal: Normal range of motion. No edema and no tenderness.  Neuro: Alert. Normal reflexes, muscle tone coordination.   Data Reviewed: I have personally reviewed following blood work and imaging studies:  CBC: Recent Labs  Lab 12/20/17 1450 12/21/17 0700 12/23/17 0401  WBC 15.2* 11.7* 7.8  NEUTROABS 12.8*  --   --   HGB 16.5 15.4 14.8  HCT 47.3 44.6 44.1  MCV 90.8 90.8 91.9  PLT 278 194 235    Basic  Metabolic Panel: Recent Labs  Lab 12/20/17 1450 12/21/17 0700 12/23/17 0401  NA 132* 134* 135  K 4.3 3.8 3.8  CL 96* 103 107  CO2 25 22 22   GLUCOSE 83 122* 92  BUN 36* 26* 15  CREATININE 1.64* 1.15 0.90  CALCIUM 9.2 8.1* 8.2*    Recent Results (from the past 240 hour(s))  Urine Culture     Status: Abnormal   Collection Time: 12/20/17  1:30 PM  Result Value Ref Range Status   Specimen Description URINE, CLEAN CATCH  Final   Special Requests NONE  Final   Culture >=100,000  COLONIES/mL SERRATIA MARCESCENS (A)  Final   Report Status 12/22/2017 FINAL  Final   Organism ID, Bacteria SERRATIA MARCESCENS (A)  Final      Susceptibility   Serratia marcescens - MIC*    CEFAZOLIN >=64 RESISTANT Resistant     CEFTRIAXONE <=1 SENSITIVE Sensitive     CIPROFLOXACIN <=0.25 SENSITIVE Sensitive     GENTAMICIN <=1 SENSITIVE Sensitive     NITROFURANTOIN 256 RESISTANT Resistant     TRIMETH/SULFA <=20 SENSITIVE Sensitive     * >=100,000 COLONIES/mL SERRATIA MARCESCENS  MRSA PCR Screening     Status: None   Collection Time: 12/20/17  6:25 PM  Result Value Ref Range Status   MRSA by PCR NEGATIVE NEGATIVE Final    Comment:        The GeneXpert MRSA Assay (FDA approved for NASAL specimens only), is one component of a comprehensive MRSA colonization surveillance program. It is not intended to diagnose MRSA infection nor to guide or monitor treatment for MRSA infections.   Blood Culture (routine x 2)     Status: None (Preliminary result)   Collection Time: 12/20/17  7:59 PM  Result Value Ref Range Status   Specimen Description BLOOD RIGHT ANTECUBITAL  Final   Special Requests   Final    BOTTLES DRAWN AEROBIC AND ANAEROBIC Blood Culture adequate volume   Culture   Final    NO GROWTH 4 DAYS Performed at [facility], [place] [address] [place], [place] [address]    Report Status PENDING  Incomplete  Blood Culture (routine x 2)     Status: None (Preliminary result)   Collection Time: 12/20/17  8:22 PM  Result Value Ref Range Status   Specimen Description BLOOD LEFT HAND  Final   Special Requests IN PEDIATRIC BOTTLE Blood Culture adequate volume  Final   Culture   Final    NO GROWTH 4 DAYS Performed at [facility], [place] [address] [place], [place] [address]    Report Status PENDING  Incomplete     Liver Function Tests: Recent Labs  Lab 12/20/17 1450 12/21/17 0700  AST 26 19  ALT 15* 11*  ALKPHOS 118 90  BILITOT 1.6* 1.2  PROT 9.0* 6.9  ALBUMIN 3.8  2.8*   BNP (last 3 results) Recent Labs    09/08/17 1741  BNP 108.7*    ProBNP (last 3 results) Recent Labs    11/27/17 0932  PROBNP 50.0    Studies: No results found.  Scheduled Meds: . amiodarone  200 mg Oral Daily  . carvedilol  12.5 mg Oral BID WC  . DULoxetine  30 mg Oral Daily  . edoxaban  60 mg Oral Daily  . ferrous sulfate  325 mg Oral BID WC  . furosemide  40 mg Oral Daily  . isosorbide-hydrALAZINE  2 tablet Oral TID  . levETIRAcetam  1,000 mg  Oral BID  . losartan  25 mg Oral Daily  . phenytoin  300 mg Oral QHS  . senna-docusate  2 tablet Oral BID  . simvastatin  20 mg Oral q1800  . sodium chloride flush  3 mL Intravenous Q12H  . spironolactone  12.5 mg Oral Daily   Time spent: 25 minutes   Faye Ramsay, MD   [facility] Pager [phone]. If 7PM-7AM, please contact night-coverage at [email or web address], Office  [phone]  password [email or web address]  12/25/2017, 1:53 PM  LOS: 5 days

## 2017-12-25 NOTE — Progress Notes (Signed)
Assumed care of patient at. Agree with previous RN assessment.

## 2017-12-25 NOTE — Anesthesia Preprocedure Evaluation (Addendum)
Anesthesia Evaluation  Patient identified by MRN, date of birth, ID band Patient awake    Reviewed: Allergy & Precautions, NPO status , Patient's Chart, lab work & pertinent test results, reviewed documented beta blocker date and time   History of Anesthesia Complications Negative for: history of anesthetic complications  Airway Mallampati: II  TM Distance: >3 FB Neck ROM: Full    Dental  (+) Dental Advisory Given, Poor Dentition, Edentulous Upper   Pulmonary former smoker (quit 2009),    Pulmonary exam normal        Cardiovascular hypertension, Pt. on medications and Pt. on home beta blockers (-) angina+ CAD and + CABG  + dysrhythmias Atrial Fibrillation + Cardiac Defibrillator  Rhythm:Irregular Rate:Normal  4/19 ECHO: EF 50% to 55%. Mild hypokinesis of the inferolateral myocardium, septal dissynchrony, mild MR, mild TR '17 Stress: EF 39% no significant ischemia   Neuro/Psych Seizures - (last seizure a month ago), Well Controlled,  CVA    GI/Hepatic negative GI ROS, Neg liver ROS,   Endo/Other  negative endocrine ROS  Renal/GU Renal InsufficiencyRenal disease   urosepsis    Musculoskeletal  (+) Arthritis ,   Abdominal (+) + obese,   Peds  Hematology  (+) Blood dyscrasia (factor VII deficiency), , savaysa   Anesthesia Other Findings   Reproductive/Obstetrics                            Anesthesia Physical Anesthesia Plan  ASA: III  Anesthesia Plan: General   Post-op Pain Management:    Induction: Intravenous  PONV Risk Score and Plan: 2 and Treatment may vary due to age or medical condition  Airway Management Planned: Natural Airway and Mask  Additional Equipment:   Intra-op Plan:   Post-operative Plan:   Informed Consent: I have reviewed the patients History and Physical, chart, labs and discussed the procedure including the risks, benefits and alternatives for the  proposed anesthesia with the patient or authorized representative who has indicated his/her understanding and acceptance.   Dental advisory given  Plan Discussed with: CRNA and Surgeon  Anesthesia Plan Comments: (Plan routine monitors, GA for cardioversion)        Anesthesia Quick Evaluation

## 2017-12-25 NOTE — Progress Notes (Signed)
Timeout done at bedside prior to cardioversion.  Dr Acie Fredrickson and Dr Glennon Mac present at bedside.

## 2017-12-26 ENCOUNTER — Encounter (HOSPITAL_COMMUNITY): Payer: Self-pay | Admitting: Cardiovascular Disease

## 2017-12-26 ENCOUNTER — Encounter: Payer: Self-pay | Admitting: Internal Medicine

## 2017-12-26 MED ORDER — SPIRONOLACTONE 12.5 MG HALF TABLET
12.5000 mg | ORAL_TABLET | Freq: Once | ORAL | Status: AC
  Administered 2017-12-26: 12.5 mg via ORAL
  Filled 2017-12-26: qty 1

## 2017-12-26 MED ORDER — SPIRONOLACTONE 25 MG PO TABS: 25.0000 mg | ORAL_TABLET | Freq: Every day | ORAL | Status: DC

## 2017-12-26 MED ORDER — SPIRONOLACTONE 25 MG PO TABS
25.0000 mg | ORAL_TABLET | Freq: Every day | ORAL | Status: DC
  Administered 2017-12-27 – 2017-12-28 (×2): 25 mg via ORAL
  Filled 2017-12-26 (×2): qty 1

## 2017-12-26 NOTE — NC FL2 (Signed)
[place] MEDICAID FL2 LEVEL OF CARE SCREENING TOOL     IDENTIFICATION  Patient Name: Louis Ford. Birthdate: 05-Dec-1946 Sex: male Admission Date (Current Location): 12/20/2017  [facility] and [place] Number:  Herbalist and Address:  [facility],  [place], [place]      Provider Number: [id number]  Attending Physician Name and Address:  Elodia Florence., *  Relative Name and Phone Number:       Current Level of Care: Hospital Recommended Level of Care: [place] Prior Approval Number:    Date Approved/Denied:   PASRR Number: [id number]  Discharge Plan: SNF    Current Diagnoses: Patient Active Problem List   Diagnosis Date Noted  . UTI (urinary tract infection) 12/20/2017  . Hallucination 11/27/2017  . Abnormal urine odor 11/27/2017  . 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 Elevated TSH 10/27/2017  . Weakness of both lower extremities 10/27/2017  . Unsteady gait 10/27/2017  . Localization-related symptomatic epilepsy and epileptic syndromes with complex partial seizures, not intractable, without status epilepticus ([place]) 10/08/2017  . Mass of torso 09/02/2017  . Insomnia 09/02/2017  . Degenerative joint disease of knee, left 08/01/2017  . Blood loss anemia 04/18/2017  . Hx of adenomatous colonic polyps 02/20/2017  . Chronic pain of left knee 01/22/2017  . Thrush 12/15/2016  . Chronic kidney disease (CKD), stage III (moderate) ([place]) 11/08/2016  . Hypertensive heart disease without heart failure   . Chronic chest wall pain 08/17/2016  . Periodontal disease 05/18/2016  . Dental infection 05/18/2016  . HLD (hyperlipidemia) 02/26/2016  . CVA (cerebral infarction) 02/26/2016  . Near syncope 02/26/2016  . Injury of right thumb 12/30/2015  . Gout 12/30/2015  . Persistent atrial fibrillation with rapid ventricular response ([place]) 04/21/2014  . Chronic systolic CHF (congestive heart failure), NYHA class 2  ([place]) 11/06/2012  . Factor VII deficiency ([place]) 10/07/2012  . Coagulopathy ([place]) 10/03/2012  . Seizure ([place]) 10/01/2012  . Renal insufficiency 10/01/2012  . Ischemic cardiomyopathy  ? additional rate component 09/29/2012  . Automatic implantable cardioverter-defibrillator in situ   . HTN (hypertension) 09/21/2012  . Coronary artery disease involving native heart 09/21/2012    Orientation RESPIRATION BLADDER Height & Weight     Self, Time, Situation, Place  Normal Incontinent, External catheter Weight: 220 lb (99.8 kg) Height:  5\' 9"  (175.3 cm)  BEHAVIORAL SYMPTOMS/MOOD NEUROLOGICAL BOWEL NUTRITION STATUS      Continent Diet(heart healthy)  AMBULATORY STATUS COMMUNICATION OF NEEDS Skin   Limited Assist Verbally Normal                       Personal Care Assistance Level of Assistance  Bathing, Feeding, Dressing Bathing Assistance: Limited assistance Feeding assistance: Independent Dressing Assistance: Independent     Functional Limitations Info  Sight, Hearing, Speech Sight Info: Adequate Hearing Info: Adequate Speech Info: Adequate    SPECIAL CARE FACTORS FREQUENCY  PT (By licensed PT), OT (By licensed OT)     PT Frequency: 5x OT Frequency: 5x            Contractures Contractures Info: Not present    Additional Factors Info  Code Status, Allergies Code Status Info: DNR Allergies Info: nka           Current Medications (12/26/2017):  This is the current hospital active medication list Current Facility-Administered Medications  Medication Dose Route Frequency Provider Last Rate Last Dose  . 0.9 %  sodium chloride infusion  250 mL Intravenous Continuous Nahser, Wonda Cheng, MD      . acetaminophen (TYLENOL) tablet 650 mg  650 mg Oral Q6H PRN Oswald Hillock, MD   650 mg at 12/26/17 0509  . amiodarone (PACERONE) tablet 200 mg  200 mg Oral Daily Oswald Hillock, MD   200 mg at 12/26/17 0911  . carvedilol (COREG) tablet 12.5 mg  12.5 mg Oral BID WC Oswald Hillock,  MD   12.5 mg at 12/26/17 0912  . cefTRIAXone (ROCEPHIN) 1 g in dextrose 5 % 50 mL IVPB  1 g Intravenous Q24H Leodis Sias T, RPH 100 mL/hr at 12/26/17 1304 1 g at 12/26/17 1304  . DULoxetine (CYMBALTA) DR capsule 30 mg  30 mg Oral Daily Oswald Hillock, MD   30 mg at 12/26/17 0911  . edoxaban (SAVAYSA) tablet 60 mg  60 mg Oral Daily Oswald Hillock, MD   60 mg at 12/26/17 0911  . ferrous sulfate tablet 325 mg  325 mg Oral BID WC Oswald Hillock, MD   325 mg at 12/26/17 0911  . furosemide (LASIX) tablet 40 mg  40 mg Oral Daily Oswald Hillock, MD   40 mg at 12/26/17 0911  . isosorbide-hydrALAZINE (BIDIL) 20-37.5 MG per tablet 2 tablet  2 tablet Oral TID Oswald Hillock, MD   2 tablet at 12/26/17 0911  . levETIRAcetam (KEPPRA) tablet 1,000 mg  1,000 mg Oral BID Oswald Hillock, MD   1,000 mg at 12/26/17 0911  . losartan (COZAAR) tablet 25 mg  25 mg Oral Daily Oswald Hillock, MD   25 mg at 12/26/17 0912  . ondansetron (ZOFRAN) tablet 4 mg  4 mg Oral Q6H PRN Oswald Hillock, MD       Or  . ondansetron (ZOFRAN) injection 4 mg  4 mg Intravenous Q6H PRN Oswald Hillock, MD      . phenytoin (DILANTIN) ER capsule 300 mg  300 mg Oral QHS Oswald Hillock, MD   300 mg at 12/25/17 2211  . senna-docusate (Senokot-S) tablet 2 tablet  2 tablet Oral BID Oswald Hillock, MD   2 tablet at 12/26/17 0911  . simvastatin (ZOCOR) tablet 20 mg  20 mg Oral q1800 Oswald Hillock, MD   20 mg at 12/25/17 1840  . sodium chloride flush (NS) 0.9 % injection 3 mL  3 mL Intravenous Q12H Nahser, Wonda Cheng, MD   3 mL at 12/25/17 2215  . sodium chloride flush (NS) 0.9 % injection 3 mL  3 mL Intravenous PRN Nahser, Wonda Cheng, MD      . Derrill Memo ON 12/27/2017] spironolactone (ALDACTONE) tablet 25 mg  25 mg Oral Daily Berton Mount, [facility]         Discharge Medications: Please see discharge summary for a list of discharge medications.  Relevant Imaging Results:  Relevant Lab Results:   Additional Information SS# [id number]  Nila Nephew,  LCSW

## 2017-12-26 NOTE — Progress Notes (Signed)
Pt plan to go to SNF. If insurance approve. Pt had selected [facility].

## 2017-12-26 NOTE — Progress Notes (Signed)
Assumed care of patient at this time. Patient is stable with no complaints at this time. Agree with previously documented assessment. Will continue to monitor patient.   

## 2017-12-26 NOTE — Progress Notes (Signed)
Physical Therapy Treatment Patient Details Name: [facility]. MRN: [id number] DOB: 26-Jun-1946 Today's Date: 12/26/2017    History of Present Illness 72 yo male admitted with UTI, A fib with RVR, fall at home. Hx of CHF, cardiomyopathy, ICD, Afib, CVA, Sz, HTN.     PT Comments    Progressing slowly with mobility. Pt c/o 8/10 R calf and foot pain on today. Encouraged pt to discuss this with MD as well. Discussed d/c plan-pt plans to return home with family assisting as needed. Will recommend HHPT and a RW for safe ambulation until he returns to baseline. Will continue to follow.     Follow Up Recommendations  Home health PT;Supervision/Assistance - 24 hour     Equipment Recommendations       Recommendations for Other Services       Precautions / Restrictions Precautions Precautions: Fall Restrictions Weight Bearing Restrictions: No    Mobility  Bed Mobility               General bed mobility comments: oob in recliner  Transfers Overall transfer level: Needs assistance Equipment used: Rolling walker (2 wheeled) Transfers: Sit to/from Stand Sit to Stand: Min assist         General transfer comment: Assist to rise, stabilize, control descent. VCs safety, technique, hand placement. Increased time.   Ambulation/Gait Ambulation/Gait assistance: Min assist Ambulation Distance (Feet): 50 Feet Assistive device: Rolling walker (2 wheeled) Gait Pattern/deviations: Step-to pattern;Decreased stride length;Antalgic     General Gait Details: very slow gait speed. Assist to stabilize intermittently. Distance limited by pain in R calf/foot. VCS safety, posture, proper use of walker.    Stairs            Wheelchair Mobility    Modified Rankin (Stroke Patients Only)       Balance Overall balance assessment: Needs assistance         Standing balance support: Bilateral upper extremity supported Standing balance-Leahy Scale: Poor                               Cognition Arousal/Alertness: Awake/alert Behavior During Therapy: WFL for tasks assessed/performed Overall Cognitive Status: Within Functional Limits for tasks assessed                                        Exercises      General Comments        Pertinent Vitals/Pain Pain Assessment: 0-10 Pain Score: 8  Pain Location: R calf/foot Pain Descriptors / Indicators: Aching;Discomfort;Sore Pain Intervention(s): Limited activity within patient's tolerance;Repositioned    Home Living                      Prior Function            PT Goals (current goals can now be found in the care plan section) Progress towards PT goals: Progressing toward goals(very slowly)    Frequency    Min 3X/week      PT Plan Discharge plan needs to be updated    Co-evaluation              AM-PAC PT "6 Clicks" Daily Activity  Outcome Measure  Difficulty turning over in bed (including adjusting bedclothes, sheets and blankets)?: A Little Difficulty moving from lying on back to sitting on the side of the  bed? : A Little Difficulty sitting down on and standing up from a chair with arms (e.g., wheelchair, bedside commode, etc,.)?: Unable Help needed moving to and from a bed to chair (including a wheelchair)?: A Little Help needed walking in hospital room?: A Little Help needed climbing 3-5 steps with a railing? : A Little 6 Click Score: 16    End of Session Equipment Utilized During Treatment: Gait belt Activity Tolerance: Patient limited by pain Patient left: in chair;with call bell/phone within reach;with chair alarm set   PT Visit Diagnosis: Muscle weakness (generalized) (M62.81);Difficulty in walking, not elsewhere classified (R26.2);Pain Pain - Right/Left: Right Pain - part of body: Leg;Ankle and joints of foot     Time: 0953-1006 PT Time Calculation (min) (ACUTE ONLY): 13 min  Charges:  $Gait Training: 8-22 mins                    G  Codes:          [name], MPT Pager: [phone]

## 2017-12-26 NOTE — Progress Notes (Signed)
Progress Note  Patient Name: Louis Ford. Date of Encounter: 12/26/2017  Primary Cardiologist:  [name]   Subjective   Patient is a 72 year old gentleman with a history of heart failure, coronary artery disease, factor VII deficiency, and atrial fibrillation.  He was admitted with a urinary tract infection.  He had persistent atrial fibrillation.  He was cardioverted successfully yesterday.  Inpatient Medications    Scheduled Meds: . amiodarone  200 mg Oral Daily  . carvedilol  12.5 mg Oral BID WC  . DULoxetine  30 mg Oral Daily  . edoxaban  60 mg Oral Daily  . ferrous sulfate  325 mg Oral BID WC  . furosemide  40 mg Oral Daily  . isosorbide-hydrALAZINE  2 tablet Oral TID  . levETIRAcetam  1,000 mg Oral BID  . losartan  25 mg Oral Daily  . phenytoin  300 mg Oral QHS  . senna-docusate  2 tablet Oral BID  . simvastatin  20 mg Oral q1800  . sodium chloride flush  3 mL Intravenous Q12H  . spironolactone  12.5 mg Oral Daily   Continuous Infusions: . sodium chloride 100 mL/hr at 12/26/17 0502  . sodium chloride    . cefTRIAXone (ROCEPHIN)  IV 1 g (12/25/17 1841)   PRN Meds: acetaminophen, ondansetron **OR** ondansetron (ZOFRAN) IV, sodium chloride flush   Vital Signs    Vitals:   12/25/17 1628 12/25/17 1757 12/25/17 2129 12/26/17 0516  BP:  119/78 115/65 (!) 126/104  Pulse:  87 74 70  Resp:  20 18 18   Temp: 98.3 F (36.8 C) 98 F (36.7 C) 98.1 F (36.7 C) 98 F (36.7 C)  TempSrc: Oral Oral Oral Oral  SpO2:  95% 98% 99%  Weight:      Height:        Intake/Output Summary (Last 24 hours) at 12/26/2017 0915 Last data filed at 12/26/2017 0517 Gross per 24 hour  Intake 400 ml  Output 1050 ml  Net -650 ml   Filed Weights   12/20/17 1238  Weight: 220 lb (99.8 kg)    Telemetry    NSR  - Personally Reviewed  ECG     NSR  - Personally Reviewed  Physical Exam   GEN: No acute distress.   Neck: No JVD Cardiac: RRR,   Respiratory: Clear to auscultation  bilaterally. GI: Soft, nontender, non-distended  MS: No edema; No deformity. Neuro:  Nonfocal  Psych: Normal affect   Labs    Chemistry Recent Labs  Lab 12/20/17 1450 12/21/17 0700 12/23/17 0401  NA 132* 134* 135  K 4.3 3.8 3.8  CL 96* 103 107  CO2 25 22 22   GLUCOSE 83 122* 92  BUN 36* 26* 15  CREATININE 1.64* 1.15 0.90  CALCIUM 9.2 8.1* 8.2*  PROT 9.0* 6.9  --   ALBUMIN 3.8 2.8*  --   AST 26 19  --   ALT 15* 11*  --   ALKPHOS 118 90  --   BILITOT 1.6* 1.2  --   GFRNONAA 41* >60 >60  GFRAA 48* >60 >60  ANIONGAP 11 9 6      Hematology Recent Labs  Lab 12/20/17 1450 12/21/17 0700 12/23/17 0401  WBC 15.2* 11.7* 7.8  RBC 5.21 4.91 4.80  HGB 16.5 15.4 14.8  HCT 47.3 44.6 44.1  MCV 90.8 90.8 91.9  MCH 31.7 31.4 30.8  MCHC 34.9 34.5 33.6  RDW 13.8 13.7 13.7  PLT 278 194 235    Cardiac EnzymesNo results for  input(s): TROPONINI in the last 168 hours. No results for input(s): TROPIPOC in the last 168 hours.   BNPNo results for input(s): BNP, PROBNP in the last 168 hours.   DDimer No results for input(s): DDIMER in the last 168 hours.   Radiology    No results found.  Cardiac Studies     Patient Profile     72 y.o. male with a history of congestive heart failure, hypertension and atrial fibrillation.  Assessment & Plan    1.  Atrial fibrillation: Patient has maintained sinus rhythm since his cardioversion yesterday.  2.  Coronary artery disease-stable.  He is not having any episodes of angina  3.  Chronic systolic congestive heart failure-stable Increase Aldactone to 25 mg a day which will also help with his hypertension   For questions or updates, please contact [place] Please consult [email or web address] for contact info under Cardiology/STEMI.      Signed, Mertie Moores, MD  12/26/2017, 9:15 AM

## 2017-12-26 NOTE — Clinical Social Work Note (Signed)
Clinical Social Work Assessment  Patient Details  Name: Louis Ford. MRN: [id number] Date of Birth: 1946/03/05  Date of referral:  12/26/17               Reason for consult:  Facility Placement                Permission sought to share information with:  Family Supports Permission granted to share information::  Yes, Verbal Permission Granted  Name::     [name]  Agency::     Relationship::     Contact Information:     Housing/Transportation Living arrangements for the past 2 months:  Single Family Home Source of Information:  Patient, Medical Team Patient Interpreter Needed:  None Criminal Activity/Legal Involvement Pertinent to Current Situation/Hospitalization:  No - Comment as needed Significant Relationships:  Spouse Lives with:  Spouse Do you feel safe going back to the place where you live?  Yes Need for family participation in patient care:  No (Coment)  Care giving concerns:  Pt from home where he resides with wife. States at baseline is is independent with ADLs and ambulating (with cane). Is concerned that pt's wife will not be able to manage his care at DC.    Social Worker assessment / plan:  CSW consulted to assess possible SNF placement. Met with pt at bedside. He advises his wife and he discussed him returning home v rehab and feel rehab would best meet his needs (explained he went to rehab several years ago and has had HHPT- is familiar with components of both.  Pt familiar with insurance authorization process and CSW discussed with him that insurance and facilities will review therapy notes and make decisions based on his information therein, and that current recommendation for HHPT may limit options and inhibit plan for SNF. Pt understanding and states he will discuss with wife but that "if insurance denies or you don't hear back in time, we'll make it work at home with therapy coming out to the house." Discussed with attending and RN- anticipate pt may be  stable for DC tomorrow.  Completed FL2 and made referrals to area SNFs. Will follow up with bed offers- once facility choice made, can proceed with insurance authorization request.    Employment status:  Retired Nurse, adult PT Recommendations:  Home with [place], [place] / Referral to community resources:  [place]  Patient/Family's Response to care:  Family not available but pt expressed appreciation for his care in hospital  Patient/Family's Understanding of and Emotional Response to Diagnosis, Current Treatment, and Prognosis:  Pt demonstrates thorough understanding of his treatment, plan, and potential barriers. Is emotionally positive - "Rehab was a good experience in the past but so was home health therapy."  Family not available- wife "out right now and is not answering." Pt states he will have her call CSW when she can and her wishes will be taken into consideration as well per pt's request.   Emotional Assessment Appearance:  Appears stated age Attitude/Demeanor/Rapport:  (pleasant) Affect (typically observed):  Adaptable Orientation:  Oriented to Self, Oriented to Place, Oriented to  Time, Oriented to Situation Alcohol / Substance use:  Not Applicable Psych involvement (Current and /or in the community):  No (Comment)  Discharge Needs  Concerns to be addressed:  Discharge Planning Concerns Readmission within the last 30 days:  No Current discharge risk:  Dependent with Mobility Barriers to Discharge:  No Barriers Identified  Nila Nephew, LCSW 12/26/2017, 2:41 PM  [phone]

## 2017-12-26 NOTE — Progress Notes (Signed)
PROGRESS NOTE    [facility].  FVC:[id number] DOB: 11/18/1946 DOA: 12/20/2017 PCP: [name], MD   Brief Narrative:  72 y.o.male,with history of ischemic cardiomyopathy status post AICD,EF50 - 55%,as per echo from 4/18,chronic atrial fibrillation,stroke,seizure disorder,hypertension came to ED after patient fell in the bathroom. Patient denies passing out.As per patient he tripped in the bathroom.He denies chest pain or shortness of breath. No nausea vomiting or diarrhea. He does have Jiayi Lengacher history of seizures and is on Keppra and phenytoin at home.  Assessment & Plan:   Principal Problem:   UTI (urinary tract infection) Active Problems:   Chronic systolic CHF (congestive heart failure), NYHA class 2 (HCC)   Persistent atrial fibrillation with rapid ventricular response ([place])  1. Sepsis due to UTI- sepsis physiology has resolved, WBC down to 7.8,  patient was started on ceftriaxone per pharmacy consultation, urine culture going Serratia marcescens sensitive to ceftriaxone, continue Ceftriaxone day 7/7. WBC is now WNL.   2. Atrial fibrillation with RVR-likely exacerbated by UTI,continue IV normal saline.Will continue amiodarone, Coreg.  Patient was seen by cardiology. Pt underwent successful cardioversion yesterday. Dose of Coreg changed to 12.5 mg PO BID.Chads risk score is 5,continue anticoagulation edoxaban.    3. Hypertension - reasonable inpatient control, continue home regimen with isosorbide -hydralazine, lasix, coreg, losartan   4. Seizure disorder - continue Phenytoin and Keppra   5. Ischemic cardiomyopathy-status post ICD [employer],continue Coreg, Isosorbide - Hydralazine, Losartan   6. Factor VIIdeficiency with prior DVTs-continue anticoagulation with Edoxaban   7. Acute kidney injury - suspect pre renal etiology, resolved   DVT prophylaxis: edoxaban Code Status: full  Family Communication: none at bedside Disposition Plan: pending,  looking into SNF placement   Consultants:   cardiology  Procedures:   Cardioversion 12/25/17 Synchronous cardioversion was performed at 120joules. The cardioversion wassuccessful  Antimicrobials:  Anti-infectives (From admission, onward)   Start     Dose/Rate Route Frequency Ordered Stop   12/21/17 1800  cefTRIAXone (ROCEPHIN) 1 g in dextrose 5 % 50 mL IVPB     1 g 100 mL/hr over 30 Minutes Intravenous Every 24 hours 12/20/17 1843     12/20/17 1615  cefTRIAXone (ROCEPHIN) 2 g in dextrose 5 % 50 mL IVPB     2 g 100 mL/hr over 30 Minutes Intravenous  Once 12/20/17 1615 12/20/17 1823   12/20/17 1545  cefTRIAXone (ROCEPHIN) 1 g in dextrose 5 % 50 mL IVPB  Status:  Discontinued     1 g 100 mL/hr over 30 Minutes Intravenous  Once 12/20/17 1541 12/20/17 1614        Subjective: Feeling better.  Objective: Vitals:   12/25/17 1757 12/25/17 2129 12/26/17 0516 12/26/17 1305  BP: 119/78 115/65 (!) 126/104 104/67  Pulse: 87 74 70 66  Resp: 20 18 18    Temp: 98 F (36.7 C) 98.1 F (36.7 C) 98 F (36.7 C) 97.9 F (36.6 C)  TempSrc: Oral Oral Oral Oral  SpO2: 95% 98% 99% 99%  Weight:      Height:        Intake/Output Summary (Last 24 hours) at 12/26/2017 1415 Last data filed at 12/26/2017 0900 Gross per 24 hour  Intake 120 ml  Output 1050 ml  Net -930 ml   Filed Weights   12/20/17 1238  Weight: 99.8 kg (220 lb)    Examination:  General exam: Appears calm and comfortable  Respiratory system: Clear to auscultation. Respiratory effort normal. Cardiovascular system: S1 & S2 heard, RRR. No  JVD, murmurs, rubs, gallops or clicks. Gastrointestinal system: Abdomen is nondistended, soft and nontender. No organomegaly or masses felt. Normal bowel sounds heard. Central nervous system: Alert and oriented. No focal neurological deficits. Extremities: trace edema Skin: No rashes, lesions or ulcers Psychiatry: Judgement and insight appear normal. Mood & affect appropriate.      Data Reviewed: I have personally reviewed following labs and imaging studies  CBC: Recent Labs  Lab 12/20/17 1450 12/21/17 0700 12/23/17 0401  WBC 15.2* 11.7* 7.8  NEUTROABS 12.8*  --   --   HGB 16.5 15.4 14.8  HCT 47.3 44.6 44.1  MCV 90.8 90.8 91.9  PLT 278 194 191   Basic Metabolic Panel: Recent Labs  Lab 12/20/17 1450 12/21/17 0700 12/23/17 0401  NA 132* 134* 135  K 4.3 3.8 3.8  CL 96* 103 107  CO2 25 22 22   GLUCOSE 83 122* 92  BUN 36* 26* 15  CREATININE 1.64* 1.15 0.90  CALCIUM 9.2 8.1* 8.2*   GFR: Estimated Creatinine Clearance: 90.2 mL/min (by C-G formula based on SCr of 0.9 mg/dL). Liver Function Tests: Recent Labs  Lab 12/20/17 1450 12/21/17 0700  AST 26 19  ALT 15* 11*  ALKPHOS 118 90  BILITOT 1.6* 1.2  PROT 9.0* 6.9  ALBUMIN 3.8 2.8*   No results for input(s): LIPASE, AMYLASE in the last 168 hours. No results for input(s): AMMONIA in the last 168 hours. Coagulation Profile: No results for input(s): INR, PROTIME in the last 168 hours. Cardiac Enzymes: No results for input(s): CKTOTAL, CKMB, CKMBINDEX, TROPONINI in the last 168 hours. BNP (last 3 results) Recent Labs    11/27/17 0932  PROBNP 50.0   HbA1C: No results for input(s): HGBA1C in the last 72 hours. CBG: No results for input(s): GLUCAP in the last 168 hours. Lipid Profile: No results for input(s): CHOL, HDL, LDLCALC, TRIG, CHOLHDL, LDLDIRECT in the last 72 hours. Thyroid Function Tests: No results for input(s): TSH, T4TOTAL, FREET4, T3FREE, THYROIDAB in the last 72 hours. Anemia Panel: No results for input(s): VITAMINB12, FOLATE, FERRITIN, TIBC, IRON, RETICCTPCT in the last 72 hours. Sepsis Labs: No results for input(s): PROCALCITON, LATICACIDVEN in the last 168 hours.  Recent Results (from the past 240 hour(s))  Urine Culture     Status: Abnormal   Collection Time: 12/20/17  1:30 PM  Result Value Ref Range Status   Specimen Description URINE, CLEAN CATCH  Final    Special Requests NONE  Final   Culture >=100,000 COLONIES/mL SERRATIA MARCESCENS (Dorismar Chay)  Final   Report Status 12/22/2017 FINAL  Final   Organism ID, Bacteria SERRATIA MARCESCENS (Mayukha Symmonds)  Final      Susceptibility   Serratia marcescens - MIC*    CEFAZOLIN >=64 RESISTANT Resistant     CEFTRIAXONE <=1 SENSITIVE Sensitive     CIPROFLOXACIN <=0.25 SENSITIVE Sensitive     GENTAMICIN <=1 SENSITIVE Sensitive     NITROFURANTOIN 256 RESISTANT Resistant     TRIMETH/SULFA <=20 SENSITIVE Sensitive     * >=100,000 COLONIES/mL SERRATIA MARCESCENS  MRSA PCR Screening     Status: None   Collection Time: 12/20/17  6:25 PM  Result Value Ref Range Status   MRSA by PCR NEGATIVE NEGATIVE Final    Comment:        The GeneXpert MRSA Assay (FDA approved for NASAL specimens only), is one component of Desmund Elman comprehensive MRSA colonization surveillance program. It is not intended to diagnose MRSA infection nor to guide or monitor treatment for MRSA infections.  Blood Culture (routine x 2)     Status: None   Collection Time: 12/20/17  7:59 PM  Result Value Ref Range Status   Specimen Description BLOOD RIGHT ANTECUBITAL  Final   Special Requests   Final    BOTTLES DRAWN AEROBIC AND ANAEROBIC Blood Culture adequate volume   Culture   Final    NO GROWTH 5 DAYS Performed at [name], [address], [place], [place] [address]    Report Status 12/25/2017 FINAL  Final  Blood Culture (routine x 2)     Status: None   Collection Time: 12/20/17  8:22 PM  Result Value Ref Range Status   Specimen Description BLOOD LEFT HAND  Final   Special Requests IN PEDIATRIC BOTTLE Blood Culture adequate volume  Final   Culture   Final    NO GROWTH 5 DAYS Performed at [facility], [place] [address], [place], [place] [address]    Report Status 12/25/2017 FINAL  Final         Radiology Studies: No results found.      Scheduled Meds: . amiodarone  200 mg Oral Daily  . carvedilol  12.5 mg Oral BID WC  .  DULoxetine  30 mg Oral Daily  . edoxaban  60 mg Oral Daily  . ferrous sulfate  325 mg Oral BID WC  . furosemide  40 mg Oral Daily  . isosorbide-hydrALAZINE  2 tablet Oral TID  . levETIRAcetam  1,000 mg Oral BID  . losartan  25 mg Oral Daily  . phenytoin  300 mg Oral QHS  . senna-docusate  2 tablet Oral BID  . simvastatin  20 mg Oral q1800  . sodium chloride flush  3 mL Intravenous Q12H  . [START ON 12/27/2017] spironolactone  25 mg Oral Daily   Continuous Infusions: . sodium chloride    . cefTRIAXone (ROCEPHIN)  IV 1 g (12/26/17 1304)     LOS: 6 days    Time spent: over 30 min    Fayrene Helper, MD [facility] Pager [phone]  If 7PM-7AM, please contact night-coverage [email or web address] Password [email or web address] 12/26/2017, 2:15 PM

## 2017-12-26 NOTE — Care Management Important Message (Signed)
Important Message  Patient Details  Name: [facility]. MRN: [id number] Date of Birth: [birth year]   Medicare Important Message Given:  Yes    Kerin Salen 12/26/2017, 10:55 AMImportant Message  Patient Details  Name: [facility]. MRN: [id number] Date of Birth: Mar 26, 1946   Medicare Important Message Given:  Yes    Kerin Salen 12/26/2017, 10:55 AM

## 2017-12-27 DIAGNOSIS — I10 Essential (primary) hypertension: Secondary | ICD-10-CM

## 2017-12-27 LAB — CBC
HCT: 37.2 % — ABNORMAL LOW (ref 39.0–52.0)
Hemoglobin: 12.4 g/dL — ABNORMAL LOW (ref 13.0–17.0)
MCH: 30.3 pg (ref 26.0–34.0)
MCHC: 33.3 g/dL (ref 30.0–36.0)
MCV: 91 fL (ref 78.0–100.0)
PLATELETS: 257 10*3/uL (ref 150–400)
RBC: 4.09 MIL/uL — ABNORMAL LOW (ref 4.22–5.81)
RDW: 13.5 % (ref 11.5–15.5)
WBC: 4.7 10*3/uL (ref 4.0–10.5)

## 2017-12-27 LAB — MAGNESIUM: Magnesium: 1.2 mg/dL — ABNORMAL LOW (ref 1.7–2.4)

## 2017-12-27 LAB — BASIC METABOLIC PANEL
Anion gap: 6 (ref 5–15)
BUN: 9 mg/dL (ref 6–20)
CALCIUM: 7.9 mg/dL — AB (ref 8.9–10.3)
CO2: 26 mmol/L (ref 22–32)
Chloride: 102 mmol/L (ref 101–111)
Creatinine, Ser: 0.86 mg/dL (ref 0.61–1.24)
GFR calc Af Amer: >60 mL/min (ref >60)
GLUCOSE: 76 mg/dL (ref 65–99)
Potassium: 3 mmol/L — ABNORMAL LOW (ref 3.5–5.1)
Sodium: 134 mmol/L — ABNORMAL LOW (ref 135–145)

## 2017-12-27 MED ORDER — DEXTROSE 5 % IV SOLN
6.0000 g | Freq: Once | INTRAVENOUS | Status: AC
  Administered 2017-12-27: 6 g via INTRAVENOUS
  Filled 2017-12-27: qty 10

## 2017-12-27 MED ORDER — POTASSIUM CHLORIDE CRYS ER 20 MEQ PO TBCR
40.0000 meq | EXTENDED_RELEASE_TABLET | ORAL | Status: AC
  Administered 2017-12-27 (×2): 40 meq via ORAL
  Filled 2017-12-27 (×2): qty 2

## 2017-12-27 NOTE — Progress Notes (Addendum)
Progress Note  Patient Name: [facility]. Date of Encounter: 12/27/2017  Primary Cardiologist: [name]   Subjective   72 yo with hx of CHF, CAD, AF  Was cardioverted 2 days ago. Tele shows recurrent AF   Inpatient Medications    Scheduled Meds: . amiodarone  200 mg Oral Daily  . carvedilol  12.5 mg Oral BID WC  . DULoxetine  30 mg Oral Daily  . edoxaban  60 mg Oral Daily  . ferrous sulfate  325 mg Oral BID WC  . furosemide  40 mg Oral Daily  . isosorbide-hydrALAZINE  2 tablet Oral TID  . levETIRAcetam  1,000 mg Oral BID  . losartan  25 mg Oral Daily  . phenytoin  300 mg Oral QHS  . senna-docusate  2 tablet Oral BID  . simvastatin  20 mg Oral q1800  . sodium chloride flush  3 mL Intravenous Q12H  . spironolactone  25 mg Oral Daily   Continuous Infusions: . sodium chloride     PRN Meds: acetaminophen, ondansetron **OR** ondansetron (ZOFRAN) IV, sodium chloride flush   Vital Signs    Vitals:   12/26/17 0516 12/26/17 1305 12/26/17 2228 12/27/17 0605  BP: (!) 126/104 104/67 105/67 115/76  Pulse: 70 66 (!) 103 69  Resp: 18  18 18   Temp: 98 F (36.7 C) 97.9 F (36.6 C) 98.8 F (37.1 C) 98.3 F (36.8 C)  TempSrc: Oral Oral Oral Oral  SpO2: 99% 99% 99% 100%  Weight:      Height:        Intake/Output Summary (Last 24 hours) at 12/27/2017 0836 Last data filed at 12/27/2017 8413 Gross per 24 hour  Intake 170 ml  Output 750 ml  Net -580 ml   Filed Weights   12/20/17 1238  Weight: 220 lb (99.8 kg)    Telemetry    Atrial fib  - Personally Reviewed  ECG    ( new one ordered )  - Personally Reviewed  Physical Exam   GEN: No acute distress.   Complains of right leg pain  Neck: No JVD Cardiac:  irreg . Irreg.  Respiratory: Clear to auscultation bilaterally. GI: Soft, nontender, non-distended  MS: No edema; No deformity. Neuro:  Nonfocal  Psych: Normal affect   Labs    Chemistry Recent Labs  Lab 12/20/17 1450 12/21/17 0700 12/23/17 0401  12/27/17 0451  NA 132* 134* 135 134*  K 4.3 3.8 3.8 3.0*  CL 96* 103 107 102  CO2 25 22 22 26   GLUCOSE 83 122* 92 76  BUN 36* 26* 15 9  CREATININE 1.64* 1.15 0.90 0.86  CALCIUM 9.2 8.1* 8.2* 7.9*  PROT 9.0* 6.9  --   --   ALBUMIN 3.8 2.8*  --   --   AST 26 19  --   --   ALT 15* 11*  --   --   ALKPHOS 118 90  --   --   BILITOT 1.6* 1.2  --   --   GFRNONAA 41* >60 >60 >60  GFRAA 48* >60 >60 >60  ANIONGAP 11 9 6 6      Hematology Recent Labs  Lab 12/21/17 0700 12/23/17 0401 12/27/17 0451  WBC 11.7* 7.8 4.7  RBC 4.91 4.80 4.09*  HGB 15.4 14.8 12.4*  HCT 44.6 44.1 37.2*  MCV 90.8 91.9 91.0  MCH 31.4 30.8 30.3  MCHC 34.5 33.6 33.3  RDW 13.7 13.7 13.5  PLT 194 235 257    Cardiac EnzymesNo  results for input(s): TROPONINI in the last 168 hours. No results for input(s): TROPIPOC in the last 168 hours.   BNPNo results for input(s): BNP, PROBNP in the last 168 hours.   DDimer No results for input(s): DDIMER in the last 168 hours.   Radiology    No results found.  Cardiac Studies     Patient Profile     72 y.o. male   Assessment & Plan    1.  Atrial fib:   It appears that he has gone back into AF . Will get a repeat ECG   2. CHF  :  Stable  , added aldactone   3.   CAD - no angina   4.  HTN :  bP is better    For questions or updates, please contact [place] Please consult [email or web address] for contact info under Cardiology/STEMI.      Signed, Mertie Moores, MD  12/27/2017, 8:36 AM

## 2017-12-27 NOTE — Progress Notes (Signed)
PT Cancellation Note  Patient Details Name: [facility]. MRN: [id number] DOB: 07/09/1946   Cancelled Treatment:    Reason Eval/Treat Not Completed: Patient declined, no reason specified   [name], MPT Pager: [phone]

## 2017-12-27 NOTE — Progress Notes (Signed)
PROGRESS NOTE    [facility].  IRC:[id number] DOB: January 19, 1946 DOA: 12/20/2017 PCP: [name], MD   Brief Narrative:  72 y.o.male,with history of ischemic cardiomyopathy status post AICD,EF50 - 55%,as per echo from 4/18,chronic atrial fibrillation,stroke,seizure disorder,hypertension came to ED after patient fell in the bathroom. Patient denies passing out.As per patient he tripped in the bathroom.He denies chest pain or shortness of breath. No nausea vomiting or diarrhea. He does have Lenell Mcconnell history of seizures and is on Keppra and phenytoin at home.  Assessment & Plan:   Principal Problem:   UTI (urinary tract infection) Active Problems:   Chronic systolic CHF (congestive heart failure), NYHA class 2 (HCC)   Persistent atrial fibrillation with rapid ventricular response ([place])  1. Sepsis due to UTI- sepsis physiology has resolved, WBC down to 7.8,  patient was started on ceftriaxone per pharmacy consultation, urine culture going Serratia marcescens sensitive to ceftriaxone, s/p Ceftriaxone x7 days. WBC is now WNL.   2. Atrial fibrillation with RVR-likely exacerbated by UTI,continue IV normal saline.Will continue amiodarone, Coreg.  Patient was seen by cardiology. Pt underwent successful cardioversion, but back in afib (currently rate controlled). Dose of Coreg changed to 12.5 mg PO BID.Chads risk score is 5,continue anticoagulation edoxaban.    3. General Malaise:  Pt looked poorly this morning, tired and c/o generalized malaise.  No specific c/o other than chronic knee pain, but didn't look as well as yesterday.  Labs looked appropriate. Will continue to monitor.   4. Hypokalemia: f/u mag, replete prn  5. Hypertension - reasonable inpatient control, continue home regimen with isosorbide -hydralazine, lasix, coreg, losartan   6. Seizure disorder - continue Phenytoin and Keppra   7. Ischemic cardiomyopathy-status post ICD [employer],continue Coreg,  Isosorbide - Hydralazine, Losartan   8. Factor VIIdeficiency with prior DVTs-continue anticoagulation with Edoxaban   9. Acute kidney injury - suspect pre renal etiology, resolved   DVT prophylaxis: edoxaban Code Status: full  Family Communication: none at bedside Disposition Plan: pending, looking into SNF placement   Consultants:   cardiology  Procedures:   Cardioversion 12/25/17 Synchronous cardioversion was performed at 120joules. The cardioversion wassuccessful  Antimicrobials:  Anti-infectives (From admission, onward)   Start     Dose/Rate Route Frequency Ordered Stop   12/21/17 1800  cefTRIAXone (ROCEPHIN) 1 g in dextrose 5 % 50 mL IVPB  Status:  Discontinued     1 g 100 mL/hr over 30 Minutes Intravenous Every 24 hours 12/20/17 1843 12/26/17 1929   12/20/17 1615  cefTRIAXone (ROCEPHIN) 2 g in dextrose 5 % 50 mL IVPB     2 g 100 mL/hr over 30 Minutes Intravenous  Once 12/20/17 1615 12/20/17 1823   12/20/17 1545  cefTRIAXone (ROCEPHIN) 1 g in dextrose 5 % 50 mL IVPB  Status:  Discontinued     1 g 100 mL/hr over 30 Minutes Intravenous  Once 12/20/17 1541 12/20/17 1614        Subjective: "malaise" C/o knee pain. No other specific.  Objective: Vitals:   12/26/17 2228 12/27/17 0605 12/27/17 1247 12/27/17 2103  BP: 105/67 115/76 109/79 102/73  Pulse: (!) 103 69 69 74  Resp: 18 18  18   Temp: 98.8 F (37.1 C) 98.3 F (36.8 C) 98.3 F (36.8 C) 98.1 F (36.7 C)  TempSrc: Oral Oral Oral Oral  SpO2: 99% 100% 100% 98%  Weight:      Height:        Intake/Output Summary (Last 24 hours) at 12/27/2017 2112 Last data  filed at 12/27/2017 1601 Gross per 24 hour  Intake -  Output 3100 ml  Net -3100 ml   Filed Weights   12/20/17 1238  Weight: 99.8 kg (220 lb)    Examination:  General: No acute distress. Cardiovascular: Heart sounds show Margrit Minner regular rate, and rhythm. No gallops or rubs. No murmurs. No JVD. Lungs: Clear to auscultation bilaterally with good air  movement. No rales, rhonchi or wheezes. Abdomen: Soft, nontender, nondistended with normal active bowel sounds. No masses. No hepatosplenomegaly. Neurological: Alert and oriented 3. Moves all extremities 4 with equal strength. Cranial nerves II through XII grossly intact. Skin: Warm and dry. No rashes or lesions. Extremities: No clubbing or cyanosis. No TTP of knee. Psychiatric: Mood and affect are normal. Insight and judgment are appropriate.   Data Reviewed: I have personally reviewed following labs and imaging studies  CBC: Recent Labs  Lab 12/21/17 0700 12/23/17 0401 12/27/17 0451  WBC 11.7* 7.8 4.7  HGB 15.4 14.8 12.4*  HCT 44.6 44.1 37.2*  MCV 90.8 91.9 91.0  PLT 194 235 016   Basic Metabolic Panel: Recent Labs  Lab 12/21/17 0700 12/23/17 0401 12/27/17 0451 12/27/17 1519  NA 134* 135 134*  --   K 3.8 3.8 3.0*  --   CL 103 107 102  --   CO2 22 22 26   --   GLUCOSE 122* 92 76  --   BUN 26* 15 9  --   CREATININE 1.15 0.90 0.86  --   CALCIUM 8.1* 8.2* 7.9*  --   MG  --   --   --  1.2*   GFR: Estimated Creatinine Clearance: 94.4 mL/min (by C-G formula based on SCr of 0.86 mg/dL). Liver Function Tests: Recent Labs  Lab 12/21/17 0700  AST 19  ALT 11*  ALKPHOS 90  BILITOT 1.2  PROT 6.9  ALBUMIN 2.8*   No results for input(s): LIPASE, AMYLASE in the last 168 hours. No results for input(s): AMMONIA in the last 168 hours. Coagulation Profile: No results for input(s): INR, PROTIME in the last 168 hours. Cardiac Enzymes: No results for input(s): CKTOTAL, CKMB, CKMBINDEX, TROPONINI in the last 168 hours. BNP (last 3 results) Recent Labs    11/27/17 0932  PROBNP 50.0   HbA1C: No results for input(s): HGBA1C in the last 72 hours. CBG: No results for input(s): GLUCAP in the last 168 hours. Lipid Profile: No results for input(s): CHOL, HDL, LDLCALC, TRIG, CHOLHDL, LDLDIRECT in the last 72 hours. Thyroid Function Tests: No results for input(s): TSH, T4TOTAL,  FREET4, T3FREE, THYROIDAB in the last 72 hours. Anemia Panel: No results for input(s): VITAMINB12, FOLATE, FERRITIN, TIBC, IRON, RETICCTPCT in the last 72 hours. Sepsis Labs: No results for input(s): PROCALCITON, LATICACIDVEN in the last 168 hours.  Recent Results (from the past 240 hour(s))  Urine Culture     Status: Abnormal   Collection Time: 12/20/17  1:30 PM  Result Value Ref Range Status   Specimen Description URINE, CLEAN CATCH  Final   Special Requests NONE  Final   Culture >=100,000 COLONIES/mL SERRATIA MARCESCENS (Darrious Youman)  Final   Report Status 12/22/2017 FINAL  Final   Organism ID, Bacteria SERRATIA MARCESCENS (Anothy Bufano)  Final      Susceptibility   Serratia marcescens - MIC*    CEFAZOLIN >=64 RESISTANT Resistant     CEFTRIAXONE <=1 SENSITIVE Sensitive     CIPROFLOXACIN <=0.25 SENSITIVE Sensitive     GENTAMICIN <=1 SENSITIVE Sensitive     NITROFURANTOIN 256 RESISTANT  Resistant     TRIMETH/SULFA <=20 SENSITIVE Sensitive     * >=100,000 COLONIES/mL SERRATIA MARCESCENS  MRSA PCR Screening     Status: None   Collection Time: 12/20/17  6:25 PM  Result Value Ref Range Status   MRSA by PCR NEGATIVE NEGATIVE Final    Comment:        The GeneXpert MRSA Assay (FDA approved for NASAL specimens only), is one component of Srihari Shellhammer comprehensive MRSA colonization surveillance program. It is not intended to diagnose MRSA infection nor to guide or monitor treatment for MRSA infections.   Blood Culture (routine x 2)     Status: None   Collection Time: 12/20/17  7:59 PM  Result Value Ref Range Status   Specimen Description BLOOD RIGHT ANTECUBITAL  Final   Special Requests   Final    BOTTLES DRAWN AEROBIC AND ANAEROBIC Blood Culture adequate volume   Culture   Final    NO GROWTH 5 DAYS Performed at [facility], [address], [place], [place] [address]    Report Status 12/25/2017 FINAL  Final  Blood Culture (routine x 2)     Status: None   Collection Time: 12/20/17  8:22 PM  Result  Value Ref Range Status   Specimen Description BLOOD LEFT HAND  Final   Special Requests IN PEDIATRIC BOTTLE Blood Culture adequate volume  Final   Culture   Final    NO GROWTH 5 DAYS Performed at [facility], [place] [address] [place], [place] [address]    Report Status 12/25/2017 FINAL  Final         Radiology Studies: No results found.      Scheduled Meds: . amiodarone  200 mg Oral Daily  . carvedilol  12.5 mg Oral BID WC  . DULoxetine  30 mg Oral Daily  . edoxaban  60 mg Oral Daily  . ferrous sulfate  325 mg Oral BID WC  . furosemide  40 mg Oral Daily  . isosorbide-hydrALAZINE  2 tablet Oral TID  . levETIRAcetam  1,000 mg Oral BID  . losartan  25 mg Oral Daily  . phenytoin  300 mg Oral QHS  . senna-docusate  2 tablet Oral BID  . simvastatin  20 mg Oral q1800  . sodium chloride flush  3 mL Intravenous Q12H  . spironolactone  25 mg Oral Daily   Continuous Infusions: . sodium chloride       LOS: 7 days    Time spent: over 30 min    Fayrene Helper, MD [facility] Pager [phone]  If 7PM-7AM, please contact night-coverage [email or web address] Password [email or web address] 12/27/2017, 9:12 PM

## 2017-12-28 LAB — CBC
HCT: 40.1 % (ref 39.0–52.0)
Hemoglobin: 13.3 g/dL (ref 13.0–17.0)
MCH: 30.4 pg (ref 26.0–34.0)
MCHC: 33.2 g/dL (ref 30.0–36.0)
MCV: 91.6 fL (ref 78.0–100.0)
PLATELETS: 241 10*3/uL (ref 150–400)
RBC: 4.38 MIL/uL (ref 4.22–5.81)
RDW: 13.5 % (ref 11.5–15.5)
WBC: 4.4 10*3/uL (ref 4.0–10.5)

## 2017-12-28 LAB — BASIC METABOLIC PANEL
Anion gap: 5 (ref 5–15)
BUN: 10 mg/dL (ref 6–20)
CALCIUM: 7.8 mg/dL — AB (ref 8.9–10.3)
CO2: 26 mmol/L (ref 22–32)
Chloride: 103 mmol/L (ref 101–111)
Creatinine, Ser: 0.8 mg/dL (ref 0.61–1.24)
Glucose, Bld: 88 mg/dL (ref 65–99)
Potassium: 4.7 mmol/L (ref 3.5–5.1)
SODIUM: 134 mmol/L — AB (ref 135–145)

## 2017-12-28 LAB — MAGNESIUM: MAGNESIUM: 2.1 mg/dL (ref 1.7–2.4)

## 2017-12-28 MED ORDER — SPIRONOLACTONE 25 MG PO TABS: 25.0000 mg | ORAL_TABLET | Freq: Every day | ORAL | 0 refills | Status: DC

## 2017-12-28 MED ORDER — AMIODARONE HCL 200 MG PO TABS
200.0000 mg | ORAL_TABLET | Freq: Once | ORAL | Status: AC
  Administered 2017-12-28: 200 mg via ORAL
  Filled 2017-12-28: qty 1

## 2017-12-28 MED ORDER — AMIODARONE HCL 400 MG PO TABS: 400.0000 mg | ORAL_TABLET | Freq: Two times a day (BID) | ORAL | 0 refills | Status: DC

## 2017-12-28 MED ORDER — CARVEDILOL 12.5 MG PO TABS: 12.5000 mg | ORAL_TABLET | Freq: Two times a day (BID) | ORAL | 0 refills | Status: DC

## 2017-12-28 MED ORDER — AMIODARONE HCL 200 MG PO TABS: 400.0000 mg | ORAL_TABLET | Freq: Two times a day (BID) | ORAL | Status: DC

## 2017-12-28 NOTE — Progress Notes (Signed)
CSW informed by patient's attending MD, that patient will dc today. CSW following to assist with patient with discharge planning. Patient awaiting insurance authorization to dc to SNF for ST rehab.   CSW followed up with [facility] regarding patient's insurance authorization, staff reported that patient did not receive insurance authorization as of 12/27/2017 and that insurance company is closed on the weekend. Staff reported that patient can admit from home if he discharges and receives insurance authorization next week.   Patient's attending MD notified.   CSW spoke with patient at bedside regarding discharge plans. Patient verbalized plan to dc home with home health services since insurance authorization was not received. CSW informed patient that patient can admit to SNF from home if insurance authorization is received, patient verbalized understanding. Patient requested that CSW call and update his wife, CSW agreed. CSW informed patient that CSW would inform RNCM about patient's interest in home health services.   CSW contacted patient's wife and provided update. Patient's wife reported that she would pick patient up later this afternoon after she got off work.  CSW notified patient's RNCM that patient was interested in home health services.   CSW signing off, no other needs identified at this time.  Abundio Miu, [place] Social Worker [facility] Cell#: [phone]

## 2017-12-28 NOTE — Progress Notes (Signed)
Progress Note  Patient Name: [facility]. Date of Encounter: 12/28/2017  Primary Cardiologist: [place]   Patient Profile     72 y.o. male with congestive heart failure coronary artery disease and atrial fibrillation treated with amiodarone and anticoagulated with edoxaban Admitted following a fall and found to have a UTI with sepsis syndrome --- developed persistent atrial fibrillation.  Underwent cardioversion 1/2  Subjective   Feeling somewhat better.  Denies chest pain or shortness of breath.  Walking around the bed room  Inpatient Medications    Scheduled Meds: . amiodarone  200 mg Oral Daily  . carvedilol  12.5 mg Oral BID WC  . DULoxetine  30 mg Oral Daily  . edoxaban  60 mg Oral Daily  . ferrous sulfate  325 mg Oral BID WC  . furosemide  40 mg Oral Daily  . isosorbide-hydrALAZINE  2 tablet Oral TID  . levETIRAcetam  1,000 mg Oral BID  . losartan  25 mg Oral Daily  . phenytoin  300 mg Oral QHS  . senna-docusate  2 tablet Oral BID  . simvastatin  20 mg Oral q1800  . sodium chloride flush  3 mL Intravenous Q12H  . spironolactone  25 mg Oral Daily   Continuous Infusions: . sodium chloride     PRN Meds: acetaminophen, ondansetron **OR** ondansetron (ZOFRAN) IV, sodium chloride flush   Vital Signs    Vitals:   12/27/17 1247 12/27/17 2103 12/27/17 2204 12/28/17 0421  BP: 109/79 102/73 (!) 85/63 110/78  Pulse: 69 74  70  Resp:  18  18  Temp: 98.3 F (36.8 C) 98.1 F (36.7 C)  98.1 F (36.7 C)  TempSrc: Oral Oral  Oral  SpO2: 100% 98%  99%  Weight:      Height:        Intake/Output Summary (Last 24 hours) at 12/28/2017 0957 Last data filed at 12/28/2017 0600 Gross per 24 hour  Intake 0 ml  Output 2000 ml  Net -2000 ml   Filed Weights   12/20/17 1238  Weight: 220 lb (99.8 kg)    Telemetry    Sinus rhythm with frequent atrial ectopy reviewed with Dr. Florene Glen  ECG    From 1/4 sinus with frequent PACs- Personally Reviewed  Well developed and  nourished in no acute distress HENT normal Neck supple with JVP-flat Clear Irregular rate and rhythm, no murmurs or gallops Abd-soft with active BS No Clubbing cyanosis edema Skin-warm and dry A & Oriented  Grossly normal sensory and motor function *   Labs    Chemistry Recent Labs  Lab 12/23/17 0401 12/27/17 0451 12/28/17 0438  NA 135 134* 134*  K 3.8 3.0* 4.7  CL 107 102 103  CO2 22 26 26   GLUCOSE 92 76 88  BUN 15 9 10   CREATININE 0.90 0.86 0.80  CALCIUM 8.2* 7.9* 7.8*  GFRNONAA >60 >60 >60  GFRAA >60 >60 >60  ANIONGAP 6 6 5      Hematology Recent Labs  Lab 12/23/17 0401 12/27/17 0451 12/28/17 0438  WBC 7.8 4.7 4.4  RBC 4.80 4.09* 4.38  HGB 14.8 12.4* 13.3  HCT 44.1 37.2* 40.1  MCV 91.9 91.0 91.6  MCH 30.8 30.3 30.4  MCHC 33.6 33.3 33.2  RDW 13.7 13.5 13.5  PLT 235 257 241    Cardiac EnzymesNo results for input(s): TROPONINI in the last 168 hours. No results for input(s): TROPIPOC in the last 168 hours.   BNPNo results for input(s): BNP, PROBNP in the  last 168 hours.   DDimer No results for input(s): DDIMER in the last 168 hours.   Radiology    No results found.  Cardiac Studies   4/18 echocardiogram LV function normal--LAE (37/1 0.7/45)    Assessment & Plan    Atrial fib:-Persistent  UTI  Congestive heart failure  Coronary artery disease  Hypertension  Patient is holding sinus rhythm on amiodarone.  We will increase the amiodarone for a couple of weeks and then drop it back down.  Continue anticoagulation.  Euvolemic.  We will see again on Monday.   Signed, Virl Axe, MD  12/28/2017, 9:57 AM

## 2017-12-28 NOTE — Discharge Summary (Signed)
Physician Discharge Summary  [facility]. WRU:[id number] DOB: [birth year] DOA: 12/20/2017  PCP: [name], MD  Admit date: 12/20/2017 Discharge date: 12/28/2017  Time spent: over 30 minutes  Recommendations for Outpatient Follow-up:  1. Follow up outpatient CBC/CMP 2. Follow up afib with cardiology (sinus today, amiodarone increased to 400 BID x7 days) 3. Follow up SNF placement (attempted SNF placement as inpatient, but no insurance authorization.  Discharged with home health with social worker, will attempt placement at home. 4. Follow up dilantin dosing, recommend repeat level and consider decreasing dose 5. Continue to monitor blood pressure with recent medication changes (BP Louis Ford bit soft on day of discharge, asymptomatic)   Discharge Diagnoses:  Principal Problem:   UTI (urinary tract infection) Active Problems:   Chronic systolic CHF (congestive heart failure), NYHA class 2 (HCC)   Persistent atrial fibrillation with rapid ventricular response ([place])   Discharge Condition: stable  Diet recommendation: heart healthy  Filed Weights   12/20/17 1238  Weight: 99.8 kg (220 lb)    History of present illness:  72 y.o.male,with history of ischemic cardiomyopathy status post AICD,EF50 - 55%,as per echo from 4/18,chronic atrial fibrillation,stroke,seizure disorder,hypertension came to ED after patient fell in the bathroom. Patient denies passing out.As per patient he tripped in the bathroom.He denies chest pain or shortness of breath. No nausea vomiting or diarrhea. He does have Louis Ford history of seizuresand ison Keppra and phenytoin at home.  He was found to have Shelah Heatley UTI and was treated with this with ceftriaxone x 7 days.  He developed afib with RVR and underwent cardioversion with cards which was successful.  His carvedilol was increased to 12.5 mg BID.  His amiodarone was increased to 400 mg BID x 7 days and then plan to resume 200 mg daily.  He was thought to have  had recurrent afib on 1/4, but was in sinus rhythm at discharge.   Hospital Course:  1. Sepsis due to UTI-sepsis physiology has resolved, WBC down to 7.8, patient was started on ceftriaxone per pharmacy consultation, urine culture going Serratia marcescens sensitive to ceftriaxone, s/p Ceftriaxone x7 days. WBC is now WNL.  2. Atrial fibrillation with RVR-likely exacerbated by UTI,continue IV normal saline.Will continue amiodarone, Coreg.  Patient was seen by cardiology.Pt underwent successful cardioversion, but back in afib (currently rate controlled). Dose of Coreg changed to 12.5 mg PO BID.Chads risk score is 5,continue anticoagulation edoxaban.  His amiodarone was increased to 400 mg BID x 7 days, resume 200 mg daily after this.   3. General Malaise:  Improved today, but still generally deconditioned.  Hoping for SNF placement, but no insurance auth.  Plan for d/c with home health and attempt placement from home.    4. Hypokalemia: improved  5. Hypertension- reasonable inpatient control, continue home regimen with isosorbide -hydralazine, lasix, coreg, losartan, spironolactone (increased dose)  6. Seizure disorder- continue Phenytoin and Keppra- phenytoin level high on admission, discussed with pharm, plan for outpatient follow up, repeat level as outpatient and consider decreasing at that time.  7. 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 Acute kidney injury- suspect pre renal etiology, resolved   Procedures:  Cardioversion 12/25/17 Synchronous cardioversion was performed at 120joules. The cardioversion wassuccessful  Consultations:  cardiology  Discharge Exam: Vitals:   12/28/17 1529 12/28/17 1759  BP: 91/69 95/66  Pulse: 72 75  Resp: 20   Temp: 98.5 F (36.9  C)   SpO2: 99%     Some L rib pain, otherwise no complaints.   General: No acute distress. Cardiovascular: Heart sounds show Louis Ford regular rate, and rhythm. No gallops or rubs. No murmurs. No JVD. Lungs: Clear to auscultation bilaterally with good air movement. No rales, rhonchi or wheezes. Abdomen: mildly tender to palpation at L infracostal border.  No abd ttp, soft, nondistended.  Neurological: Alert and oriented 3. Moves all extremities 4. Cranial nerves II through XII grossly intact. Skin: Warm and dry. No rashes or lesions. Extremities: No clubbing or cyanosis. 1+ edema Psychiatric: Mood and affect are normal. Insight and judgment are appropriate.  Discharge Instructions   Discharge Instructions    Call MD for:  extreme fatigue   Complete by:  As directed    Call MD for:  persistant dizziness or light-headedness   Complete by:  As directed    Call MD for:  persistant nausea and vomiting   Complete by:  As directed    Call MD for:  redness, tenderness, or signs of infection (pain, swelling, redness, odor or green/yellow discharge around incision site)   Complete by:  As directed    Call MD for:  severe uncontrolled pain   Complete by:  As directed    Call MD for:  temperature >100.4   Complete by:  As directed    Diet - low sodium heart healthy   Complete by:  As directed    Discharge instructions   Complete by:  As directed    You were seen for Louis Ford urinary tract infection.  We treated your infection.  You had worsening of your Louis Ford fib, which has improved after cardioversion by cardiology.  Cardiology increased your amiodarone temporarily (take 400 mg daily for 1 week then resume 200 mg daily).  Your dilantin level was high, please follow up with your neurologist whether to continue this dose.  Please follow up with cardiology and your PCP as an outpatient.  Please have your PCP request records from this hospitalization so they know what was done and what should be followed up on.  We are sending you  home with home health for physical therapy, Nazarene Bunning nurse, and social work.  Return if you have new, worsening, or recurrent symptoms.   Increase activity slowly   Complete by:  As directed      Allergies as of 12/28/2017   No Known Allergies     Medication List    TAKE these medications   acetaminophen 325 MG tablet Commonly known as:  TYLENOL Take 650 mg by mouth every 6 (six) hours as needed for mild pain.   amiodarone 200 MG tablet Commonly known as:  PACERONE Take 1 tablet (200 mg total) by mouth daily. What changed:  Another medication with the same name was added. Make sure you understand how and when to take each.   amiodarone 400 MG tablet Commonly known as:  PACERONE Take 1 tablet (400 mg total) by mouth 2 (two) times daily for 7 days. (then resume 200 mg daily) What changed:  You were already taking Rosco Harriott medication with the same name, and this prescription was added. Make sure you understand how and when to take each.   BIDIL 20-37.5 MG tablet Generic drug:  isosorbide-hydrALAZINE TAKE TWO TABLETS BY MOUTH THREE TIMES DAILY   carvedilol 12.5 MG tablet Commonly known as:  COREG Take 1 tablet (12.5 mg total) by mouth 2 (two) times daily with Elienai Gailey meal. What changed:  medication strength  how much to take  when to take this   diclofenac sodium 1 % Gel Commonly known as:  VOLTAREN Apply 4 g topically 4 (four) times daily as needed.   DULoxetine 30 MG capsule Commonly known as:  CYMBALTA Take 1 capsule (30 mg total) by mouth daily.   ferrous sulfate 325 (65 FE) MG tablet Take 1 tablet (325 mg total) 2 (two) times daily with Louis Ford meal by mouth.   furosemide 40 MG tablet Commonly known as:  LASIX TAKE ONE TABLET BY MOUTH ONCE DAILY What changed:    how much to take  how to take this  when to take this   levETIRAcetam 1000 MG tablet Commonly known as:  KEPPRA Take 1 tablet (1,000 mg total) by mouth 2 (two) times daily.   losartan 25 MG tablet Commonly known as:   COZAAR TAKE ONE TABLET BY MOUTH ONCE DAILY What changed:    how much to take  how to take this  when to take this   nystatin 100000 UNIT/ML suspension Commonly known as:  MYCOSTATIN SWISH, GARGLE AND SPIT OUT 5-10 MILLILITERS EVERY 6 HOURS AS NEEDED   phenytoin 300 MG ER capsule Commonly known as:  DILANTIN Take 1 capsule (300 mg total) by mouth at bedtime.   polyethylene glycol packet Commonly known as:  MIRALAX / GLYCOLAX Take 17 g by mouth daily as needed.   SAVAYSA 60 MG Tabs tablet Generic drug:  edoxaban TAKE 1 TABLET BY MOUTH ONCE DAILY What changed:    how much to take  how to take this  when to take this   senna-docusate 8.6-50 MG tablet Commonly known as:  Senokot-S Take 2 tablets by mouth 2 (two) times daily.   simvastatin 20 MG tablet Commonly known as:  ZOCOR Take 1 tablet (20 mg total) by mouth daily at 6 PM. Overdue for annual appt w/labs must make appt for future refills   spironolactone 25 MG tablet Commonly known as:  ALDACTONE Take 1 tablet (25 mg total) by mouth daily. Start taking on:  12/29/2017 What changed:  how much to take   tamsulosin 0.4 MG Caps capsule Commonly known as:  FLOMAX Take 1 capsule (0.4 mg total) daily by mouth.   triamcinolone cream 0.1 % Commonly known as:  KENALOG Apply 1 application 2 (two) times daily topically.   Vitamin D (Ergocalciferol) 50000 units Caps capsule Commonly known as:  DRISDOL Take 1 capsule (50,000 Units total) by mouth every 7 (seven) days. Notes to patient:  Every seven days            Durable Medical Equipment  (From admission, onward)        Start     Ordered   12/28/17 1630  For home use only DME Walker rolling  Once    Question:  Patient needs Louis Ford walker to treat with the following condition  Answer:  Atrial fibrillation ([place])   12/28/17 1630     No Known [place] Follow up.   Specialty:  Home Health  Services Why:  local office number [phone] Physical Therapy, RN, aide and Social Worker-agency will call to arrange initial visit Contact information: [place] [address] [phone]        [name], MD Follow up.   Specialty:  Internal Medicine Contact information: [place] [address] [phone]  [name], [name], MD Follow up.   Specialty:  Cardiology Why:  Please call to schedule an appointment with cardiology Contact information: [address] [place] [address] [phone]        [name], MD Follow up.   Specialty:  Neurology Contact information: [place] [address] [phone]            The results of significant diagnostics from this hospitalization (including imaging, microbiology, ancillary and laboratory) are listed below for reference.    Significant Diagnostic Studies: No results found.  Microbiology: Recent Results (from the past 240 hour(s))  Urine Culture     Status: Abnormal   Collection Time: 12/20/17  1:30 PM  Result Value Ref Range Status   Specimen Description URINE, CLEAN CATCH  Final   Special Requests NONE  Final   Culture >=100,000 COLONIES/mL SERRATIA MARCESCENS (Jonelle Bann)  Final   Report Status 12/22/2017 FINAL  Final   Organism ID, Bacteria SERRATIA MARCESCENS (Gavon Majano)  Final      Susceptibility   Serratia marcescens - MIC*    CEFAZOLIN >=64 RESISTANT Resistant     CEFTRIAXONE <=1 SENSITIVE Sensitive     CIPROFLOXACIN <=0.25 SENSITIVE Sensitive     GENTAMICIN <=1 SENSITIVE Sensitive     NITROFURANTOIN 256 RESISTANT Resistant     TRIMETH/SULFA <=20 SENSITIVE Sensitive     * >=100,000 COLONIES/mL SERRATIA MARCESCENS  MRSA PCR Screening     Status: None   Collection Time: 12/20/17  6:25 PM  Result Value Ref Range Status   MRSA by PCR NEGATIVE NEGATIVE Final    Comment:        The GeneXpert MRSA  Assay (FDA approved for NASAL specimens only), is one component of Louis Ford comprehensive MRSA colonization surveillance program. It is not intended to diagnose MRSA infection nor to guide or monitor treatment for MRSA infections.   Blood Culture (routine x 2)     Status: None   Collection Time: 12/20/17  7:59 PM  Result Value Ref Range Status   Specimen Description BLOOD RIGHT ANTECUBITAL  Final   Special Requests   Final    BOTTLES DRAWN AEROBIC AND ANAEROBIC Blood Culture adequate volume   Culture   Final    NO GROWTH 5 DAYS Performed at [facility], [address], [place], [place] [address]    Report Status 12/25/2017 FINAL  Final  Blood Culture (routine x 2)     Status: None   Collection Time: 12/20/17  8:22 PM  Result Value Ref Range Status   Specimen Description BLOOD LEFT HAND  Final   Special Requests IN PEDIATRIC BOTTLE Blood Culture adequate volume  Final   Culture   Final    NO GROWTH 5 DAYS Performed at [facility], [place] [address] [place], [place] [address]    Report Status 12/25/2017 FINAL  Final     Labs: Basic Metabolic Panel: Recent Labs  Lab 12/23/17 0401 12/27/17 0451 12/27/17 1519 12/28/17 0438  NA 135 134*  --  134*  K 3.8 3.0*  --  4.7  CL 107 102  --  103  CO2 22 26  --  26  GLUCOSE 92 76  --  88  BUN 15 9  --  10  CREATININE 0.90 0.86  --  0.80  CALCIUM 8.2* 7.9*  --  7.8*  MG  --   --  1.2* 2.1   Liver Function Tests: No  results for input(s): AST, ALT, ALKPHOS, BILITOT, PROT, ALBUMIN in the last 168 hours. No results for input(s): LIPASE, AMYLASE in the last 168 hours. No results for input(s): AMMONIA in the last 168 hours. CBC: Recent Labs  Lab 12/23/17 0401 12/27/17 0451 12/28/17 0438  WBC 7.8 4.7 4.4  HGB 14.8 12.4* 13.3  HCT 44.1 37.2* 40.1  MCV 91.9 91.0 91.6  PLT 235 257 241   Cardiac Enzymes: No results for input(s): CKTOTAL, CKMB, CKMBINDEX, TROPONINI in the last 168 hours. BNP: BNP (last 3  results) Recent Labs    09/08/17 1741  BNP 108.7*    ProBNP (last 3 results) Recent Labs    11/27/17 0932  PROBNP 50.0    CBG: No results for input(s): GLUCAP in the last 168 hours.     Signed:  Fayrene Helper MD.  [facility] 12/28/2017, 9:11 PM

## 2017-12-28 NOTE — Care Management Note (Addendum)
Case Management Note  Patient Details  Name: [facility]. MRN: [id number] Date of Birth: 1946-04-30                    Action/Plan: Discharge Planning: please see previous NCM note, pt active with [facility].  Please CSW notes. Unable to dc to SNF without insurance auth. Contacted [facility] to make aware of pt's possible dc today or tomorrow. Pt states his RW is broken. Contacted [facility] for RW for home.    PCP Pricilla Holm A    Expected Discharge Date:              Expected Discharge Plan:  [place]  In-House Referral:  Clinical Social Work  Discharge planning Services  CM Consult  Post Acute Care Choice:  Home Health Choice offered to:  Patient, Spouse  DME Arranged:  Walker rolling DME Agency:  [place] Arranged:  PT, Nurse's Aide, RN, Social Work [employer] Agency:  [facility]  Status of Service:  Completed, signed off  If discussed at [employer], dates discussed:    Additional Comments:  Erenest Rasher, RN 12/28/2017, 4:35 PM

## 2017-12-28 NOTE — Progress Notes (Signed)
Reviewed discharge information with patient and caregiver. Answered all questions. Patient and caregiver able to teach back medications and reasons to contact MD or 911. Patient verbalizes importance of PCP follow up appointment. 

## 2017-12-30 ENCOUNTER — Telehealth: Payer: Self-pay | Admitting: *Deleted

## 2017-12-30 ENCOUNTER — Telehealth (HOSPITAL_COMMUNITY): Payer: Self-pay | Admitting: Surgery

## 2017-12-30 DIAGNOSIS — M109 Gout, unspecified: Secondary | ICD-10-CM | POA: Diagnosis not present

## 2017-12-30 DIAGNOSIS — I10 Essential (primary) hypertension: Secondary | ICD-10-CM | POA: Diagnosis not present

## 2017-12-30 DIAGNOSIS — E669 Obesity, unspecified: Secondary | ICD-10-CM | POA: Diagnosis not present

## 2017-12-30 DIAGNOSIS — Z951 Presence of aortocoronary bypass graft: Secondary | ICD-10-CM | POA: Diagnosis not present

## 2017-12-30 DIAGNOSIS — G40909 Epilepsy, unspecified, not intractable, without status epilepticus: Secondary | ICD-10-CM | POA: Diagnosis not present

## 2017-12-30 DIAGNOSIS — N401 Enlarged prostate with lower urinary tract symptoms: Secondary | ICD-10-CM | POA: Diagnosis not present

## 2017-12-30 DIAGNOSIS — E785 Hyperlipidemia, unspecified: Secondary | ICD-10-CM | POA: Diagnosis not present

## 2017-12-30 DIAGNOSIS — I2510 Atherosclerotic heart disease of native coronary artery without angina pectoris: Secondary | ICD-10-CM | POA: Diagnosis not present

## 2017-12-30 DIAGNOSIS — Z6830 Body mass index (BMI) 30.0-30.9, adult: Secondary | ICD-10-CM | POA: Diagnosis not present

## 2017-12-30 DIAGNOSIS — I482 Chronic atrial fibrillation: Secondary | ICD-10-CM | POA: Diagnosis not present

## 2017-12-30 DIAGNOSIS — Z86010 Personal history of colonic polyps: Secondary | ICD-10-CM | POA: Diagnosis not present

## 2017-12-30 DIAGNOSIS — N390 Urinary tract infection, site not specified: Secondary | ICD-10-CM | POA: Diagnosis not present

## 2017-12-30 DIAGNOSIS — I480 Paroxysmal atrial fibrillation: Secondary | ICD-10-CM | POA: Diagnosis not present

## 2017-12-30 DIAGNOSIS — I5022 Chronic systolic (congestive) heart failure: Secondary | ICD-10-CM | POA: Diagnosis not present

## 2017-12-30 DIAGNOSIS — M6281 Muscle weakness (generalized): Secondary | ICD-10-CM | POA: Diagnosis not present

## 2017-12-30 DIAGNOSIS — Z8614 Personal history of Methicillin resistant Staphylococcus aureus infection: Secondary | ICD-10-CM | POA: Diagnosis not present

## 2017-12-30 DIAGNOSIS — I5020 Unspecified systolic (congestive) heart failure: Secondary | ICD-10-CM | POA: Diagnosis not present

## 2017-12-30 DIAGNOSIS — N183 Chronic kidney disease, stage 3 (moderate): Secondary | ICD-10-CM | POA: Diagnosis not present

## 2017-12-30 DIAGNOSIS — Z87891 Personal history of nicotine dependence: Secondary | ICD-10-CM | POA: Diagnosis not present

## 2017-12-30 DIAGNOSIS — D682 Hereditary deficiency of other clotting factors: Secondary | ICD-10-CM | POA: Diagnosis not present

## 2017-12-30 DIAGNOSIS — Z79899 Other long term (current) drug therapy: Secondary | ICD-10-CM | POA: Diagnosis not present

## 2017-12-30 DIAGNOSIS — M1A9XX0 Chronic gout, unspecified, without tophus (tophi): Secondary | ICD-10-CM | POA: Diagnosis not present

## 2017-12-30 DIAGNOSIS — I639 Cerebral infarction, unspecified: Secondary | ICD-10-CM | POA: Diagnosis not present

## 2017-12-30 DIAGNOSIS — Z7901 Long term (current) use of anticoagulants: Secondary | ICD-10-CM | POA: Diagnosis not present

## 2017-12-30 DIAGNOSIS — R262 Difficulty in walking, not elsewhere classified: Secondary | ICD-10-CM | POA: Diagnosis not present

## 2017-12-30 DIAGNOSIS — Z8261 Family history of arthritis: Secondary | ICD-10-CM | POA: Diagnosis not present

## 2017-12-30 DIAGNOSIS — R2689 Other abnormalities of gait and mobility: Secondary | ICD-10-CM | POA: Diagnosis not present

## 2017-12-30 DIAGNOSIS — Z95810 Presence of automatic (implantable) cardiac defibrillator: Secondary | ICD-10-CM | POA: Diagnosis not present

## 2017-12-30 DIAGNOSIS — Z8673 Personal history of transient ischemic attack (TIA), and cerebral infarction without residual deficits: Secondary | ICD-10-CM | POA: Diagnosis not present

## 2017-12-30 DIAGNOSIS — G4700 Insomnia, unspecified: Secondary | ICD-10-CM | POA: Diagnosis not present

## 2017-12-30 DIAGNOSIS — I255 Ischemic cardiomyopathy: Secondary | ICD-10-CM | POA: Diagnosis not present

## 2017-12-30 DIAGNOSIS — I481 Persistent atrial fibrillation: Secondary | ICD-10-CM | POA: Diagnosis not present

## 2017-12-30 DIAGNOSIS — I130 Hypertensive heart and chronic kidney disease with heart failure and stage 1 through stage 4 chronic kidney disease, or unspecified chronic kidney disease: Secondary | ICD-10-CM | POA: Diagnosis not present

## 2017-12-30 DIAGNOSIS — Z8249 Family history of ischemic heart disease and other diseases of the circulatory system: Secondary | ICD-10-CM | POA: Diagnosis not present

## 2017-12-30 NOTE — Telephone Encounter (Signed)
I spoke with Louis Ford and his [name].  She tells me that he is very weak after hospitalization and that they are hopeful he will be admitted to [facility] for rehab this week.  I have scheduled a follow-up appt for him in the [facility] on Wednesday 1/9 at 1130 AM.

## 2017-12-30 NOTE — Telephone Encounter (Signed)
Transition Care Management Follow-up Telephone Call   Date discharged? 12/28/17   How have you been since you were released from the hospital? Spoke w/[name]) she states husband seems to be doing fine   Do you understand why you were in the hospital? YES   Do you understand the discharge instructions? YES   Where were you discharged to? Home   Items Reviewed:  Medications reviewed: YES  Allergies reviewed: YES  Dietary changes reviewed: YES  Referrals reviewed: YES, wife states they are still waiting for a call for nurse to come out. He has appt schedule w/cardiology this coming Wednesday   Functional Questionnaire:   Activities of Daily Living (ADLs):   She states he are independent in the following: bathing and hygiene, feeding, continence, grooming, toileting and dressing States he require assistance with the following: ambulation   Any transportation issues/concerns?: NO   Any patient concerns? NO   Confirmed importance and date/time of follow-up visits scheduled YES, appt 01/03/18  Provider Appointment booked with Dr. Sharlet Salina  Confirmed with patient if condition begins to worsen call PCP or go to the ER.  Patient was given the office number and encouraged to call back with question or concerns.  : YES

## 2017-12-31 DIAGNOSIS — I482 Chronic atrial fibrillation: Secondary | ICD-10-CM | POA: Diagnosis not present

## 2017-12-31 DIAGNOSIS — I5022 Chronic systolic (congestive) heart failure: Secondary | ICD-10-CM | POA: Diagnosis not present

## 2017-12-31 DIAGNOSIS — I10 Essential (primary) hypertension: Secondary | ICD-10-CM | POA: Diagnosis not present

## 2018-01-01 ENCOUNTER — Encounter (HOSPITAL_COMMUNITY): Payer: Medicare Other

## 2018-01-03 ENCOUNTER — Ambulatory Visit: Payer: Medicare Other | Admitting: Internal Medicine

## 2018-01-07 DIAGNOSIS — N183 Chronic kidney disease, stage 3 (moderate): Secondary | ICD-10-CM | POA: Diagnosis not present

## 2018-01-07 DIAGNOSIS — R262 Difficulty in walking, not elsewhere classified: Secondary | ICD-10-CM | POA: Diagnosis not present

## 2018-01-07 DIAGNOSIS — I255 Ischemic cardiomyopathy: Secondary | ICD-10-CM | POA: Diagnosis not present

## 2018-01-07 DIAGNOSIS — M6281 Muscle weakness (generalized): Secondary | ICD-10-CM | POA: Diagnosis not present

## 2018-01-07 DIAGNOSIS — I480 Paroxysmal atrial fibrillation: Secondary | ICD-10-CM | POA: Diagnosis not present

## 2018-01-13 ENCOUNTER — Ambulatory Visit (HOSPITAL_COMMUNITY)
Admission: RE | Admit: 2018-01-13 | Discharge: 2018-01-13 | Disposition: A | Discharge status: Home / Self Care | Payer: Medicare Other | Source: Ambulatory Visit | Attending: Internal Medicine | Admitting: Internal Medicine

## 2018-01-13 VITALS — BP 120/72 | HR 70 | Wt 206.0 lb

## 2018-01-13 DIAGNOSIS — M1A9XX0 Chronic gout, unspecified, without tophus (tophi): Secondary | ICD-10-CM

## 2018-01-13 DIAGNOSIS — Z86010 Personal history of colonic polyps: Secondary | ICD-10-CM | POA: Insufficient documentation

## 2018-01-13 DIAGNOSIS — G40909 Epilepsy, unspecified, not intractable, without status epilepticus: Secondary | ICD-10-CM | POA: Insufficient documentation

## 2018-01-13 DIAGNOSIS — Z95810 Presence of automatic (implantable) cardiac defibrillator: Secondary | ICD-10-CM | POA: Insufficient documentation

## 2018-01-13 DIAGNOSIS — M109 Gout, unspecified: Secondary | ICD-10-CM | POA: Diagnosis not present

## 2018-01-13 DIAGNOSIS — I255 Ischemic cardiomyopathy: Secondary | ICD-10-CM | POA: Insufficient documentation

## 2018-01-13 DIAGNOSIS — Z8249 Family history of ischemic heart disease and other diseases of the circulatory system: Secondary | ICD-10-CM | POA: Insufficient documentation

## 2018-01-13 DIAGNOSIS — N183 Chronic kidney disease, stage 3 unspecified: Secondary | ICD-10-CM

## 2018-01-13 DIAGNOSIS — E785 Hyperlipidemia, unspecified: Secondary | ICD-10-CM | POA: Insufficient documentation

## 2018-01-13 DIAGNOSIS — I5022 Chronic systolic (congestive) heart failure: Secondary | ICD-10-CM | POA: Insufficient documentation

## 2018-01-13 DIAGNOSIS — I480 Paroxysmal atrial fibrillation: Secondary | ICD-10-CM | POA: Diagnosis not present

## 2018-01-13 DIAGNOSIS — Z8673 Personal history of transient ischemic attack (TIA), and cerebral infarction without residual deficits: Secondary | ICD-10-CM | POA: Diagnosis not present

## 2018-01-13 DIAGNOSIS — Z951 Presence of aortocoronary bypass graft: Secondary | ICD-10-CM | POA: Diagnosis not present

## 2018-01-13 DIAGNOSIS — Z87891 Personal history of nicotine dependence: Secondary | ICD-10-CM | POA: Insufficient documentation

## 2018-01-13 DIAGNOSIS — I130 Hypertensive heart and chronic kidney disease with heart failure and stage 1 through stage 4 chronic kidney disease, or unspecified chronic kidney disease: Secondary | ICD-10-CM | POA: Diagnosis not present

## 2018-01-13 DIAGNOSIS — I2510 Atherosclerotic heart disease of native coronary artery without angina pectoris: Secondary | ICD-10-CM | POA: Diagnosis not present

## 2018-01-13 DIAGNOSIS — E669 Obesity, unspecified: Secondary | ICD-10-CM | POA: Insufficient documentation

## 2018-01-13 DIAGNOSIS — I481 Persistent atrial fibrillation: Secondary | ICD-10-CM | POA: Insufficient documentation

## 2018-01-13 DIAGNOSIS — Z8261 Family history of arthritis: Secondary | ICD-10-CM | POA: Diagnosis not present

## 2018-01-13 DIAGNOSIS — Z79899 Other long term (current) drug therapy: Secondary | ICD-10-CM | POA: Insufficient documentation

## 2018-01-13 DIAGNOSIS — I10 Essential (primary) hypertension: Secondary | ICD-10-CM

## 2018-01-13 DIAGNOSIS — Z7901 Long term (current) use of anticoagulants: Secondary | ICD-10-CM | POA: Diagnosis not present

## 2018-01-13 DIAGNOSIS — Z8614 Personal history of Methicillin resistant Staphylococcus aureus infection: Secondary | ICD-10-CM | POA: Diagnosis not present

## 2018-01-13 DIAGNOSIS — Z6830 Body mass index (BMI) 30.0-30.9, adult: Secondary | ICD-10-CM | POA: Insufficient documentation

## 2018-01-13 DIAGNOSIS — D682 Hereditary deficiency of other clotting factors: Secondary | ICD-10-CM | POA: Insufficient documentation

## 2018-01-13 NOTE — Progress Notes (Signed)
Patient ID: Louis Dandy., male   DOB: 08-18-1946, 72 y.o.   MRN: [id number]    Advanced Heart Failure Clinic Note   Primary Care: Dr Pricilla Holm Primary Cardiologist: Dr Lovena Le Primary HF: Dr. Haroldine Laws  HPI: [facility] Louis Bonito. is a 72 y.o. male with hx of CAD s/p CABG, ICM s/p Biotronik ICD, chronic systolic CHF Echo 34/74/25 LVEF 30-35% -> improved to 55-60% (3/17),HTN, HLD, atrial fibrillation, previous stroke, Factor 7 deficiency, and seizure disorder.  Admitted from [facility] in 10/16 with ADHF in setting of AF with RVR. He diuresed well initially on IV lasix 40 mg BID, but continued to have SOB and was thus on milrinoneon 10/14 for concerns of low output. RHC on 10/12/15 that showed depressed cardiac output with Fick CO/CI 3.6/1.6. Improved on milrinone and remained stable with wean. Bidil started and increased to goal dose.Also started on amio for afib rate control. D/c weight 229 lbs  Admitted 02/26/16 after two near syncopal episodes. ICD interrogation negative for any acute events. Suspect related to overdiuresis and hypotension with recurrent Afib.  Spontaneously converted to NSR without intervention. Echo EF 55-60%   Myoview done for CP 11/17 Nuclear stress EF: 39%. Inferior wall akinesis. Dilated left ventricle. Defect 1: There is a small defect of mild severity present in the apex location.  Admitted 2/18 with LGIB after polypectomy found to have bleeding from the stalk and was clipped.   Admitted 12/28 => 12/28/17. Pt had UTI that exacerbated his PAF leading to Afib RVR. Underwent DCCV with transition to NSR. Amiodarone loaded.   He presents today for post hospital follow up. He is doing great overall.  Spent 2 weeks in rehab and feeling great. Back at home with his wife and stepson.  Denies SOB or CP. No peripheral edema. No palpitations. He is taking all medication as directed. Denies bleeding on Syvasa. No further falls.   EKG shows NSR 66 bpm.  RHC 10/12/15 RA =  7 RV = 28/1/8 PA = 32/15 (20) PCW = 12 Fick cardiac output/index = 3.6/1.6 Thermo cardiac output/index = 3.5/1.5 PVR = 2.2 WU PA sat = 44%, 47% Ao sat = 98%  Echo 10/05/15 LVEF 30-35%, Mild MR, PA peak pressure 34 mmHg Echo 02/27/16 LVEF 55-60%, Grade 2 DD dysfunction. LAE mildly dilated, PA peak pressure 35 mm Hg.  Myoview 11/17 EF 39%  Labs: 12/30/15 K 5.0, creatinine 1.3 Labs: 02/29/16 K 4.7, creatinine 1.27 Labs: 03/25/2016: K 4.3 Creatinine 1.31   Past Medical History:  Diagnosis Date  . AICD (automatic cardioverter/defibrillator) present   . Atrial fibrillation   09/22/2012  . Blood loss anemia 04/18/2017   After GI bleed from colonoscopy and polypectomy  . CAD (coronary artery disease)   . Chronic systolic heart failure ([place])   . 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 GIB (gastrointestinal bleeding) 02/20/2017  . History of MRSA infection 05/2011  . HTN (hypertension)   . Hx of adenomatous colonic polyps 02/20/2017   01/2017 - 3 cm sigmoid TV adenoma and other smaller polyps - had post-polypectomy bleed Tx w/ clips Consider repeat colonoscopy 3 yrs [name], MD, [name]   . Hyperlipidemia   . implantable cardiac defibrillator-Biotronik    Device Implanted 2006; s/p gen change 03/2011 : bleeding persistent with pocket erosion and infection; explant and reimplant  06/2011  . Ischemic cardiomyopathy    EF 15 to 20% by TTE and TEE in 09/2012.  Severe LV  dysfunction  . Obesity    BMI 31 in 09/2012  . Persistent atrial fibrillation with rapid ventricular response ([place]) 04/21/2014  . Seizure disorder ([place]) latest 09/30/2012  . Seizures ([place])   . Stroke [facility])     Current Outpatient Medications  Medication Sig Dispense Refill  . acetaminophen (TYLENOL) 325 MG tablet Take 650 mg by mouth every 6 (six) hours as needed for mild pain.    [name] amiodarone (PACERONE) 200 MG tablet Take 1 tablet (200 mg total) by mouth daily. 30 tablet 3  . BIDIL  20-37.5 MG tablet TAKE TWO TABLETS BY MOUTH THREE TIMES DAILY 180 tablet 6  . carvedilol (COREG) 12.5 MG tablet Take 1 tablet (12.5 mg total) by mouth 2 (two) times daily with a meal. 60 tablet 0  . diclofenac sodium (VOLTAREN) 1 % GEL Apply 4 g topically 4 (four) times daily as needed. 100 g 5  . DULoxetine (CYMBALTA) 30 MG capsule Take 1 capsule (30 mg total) by mouth daily. 30 capsule 11  . ferrous sulfate 325 (65 FE) MG tablet Take 1 tablet (325 mg total) 2 (two) times daily with a meal by mouth. 60 tablet 3  . furosemide (LASIX) 40 MG tablet TAKE ONE TABLET BY MOUTH ONCE DAILY (Patient taking differently: TAKE 40mg  BY MOUTH ONCE DAILY) 90 tablet 3  . levETIRAcetam (KEPPRA) 1000 MG tablet Take 1 tablet (1,000 mg total) by mouth 2 (two) times daily. 60 tablet 11  . losartan (COZAAR) 25 MG tablet TAKE ONE TABLET BY MOUTH ONCE DAILY (Patient taking differently: TAKE 25mg  BY MOUTH ONCE DAILY) 90 tablet 3  . nystatin (MYCOSTATIN) 100000 UNIT/ML suspension SWISH, GARGLE AND SPIT OUT 5-10 MILLILITERS EVERY 6 HOURS AS NEEDED 240 mL 11  . phenytoin (DILANTIN) 300 MG ER capsule Take 1 capsule (300 mg total) by mouth at bedtime. 30 capsule 11  . polyethylene glycol (MIRALAX / GLYCOLAX) packet Take 17 g by mouth daily as needed. 14 each 0  . SAVAYSA 60 MG TABS tablet TAKE 1 TABLET BY MOUTH ONCE DAILY (Patient taking differently: TAKE 60mg  BY MOUTH ONCE DAILY) 30 tablet 6  . senna-docusate (SENOKOT-S) 8.6-50 MG tablet Take 2 tablets by mouth 2 (two) times daily. 10 tablet 0  . simvastatin (ZOCOR) 20 MG tablet Take 1 tablet (20 mg total) by mouth daily at 6 PM. Overdue for annual appt w/labs must make appt for future refills 90 tablet 1  . spironolactone (ALDACTONE) 25 MG tablet Take 1 tablet (25 mg total) by mouth daily. 30 tablet 0  . tamsulosin (FLOMAX) 0.4 MG CAPS capsule Take 1 capsule (0.4 mg total) daily by mouth. 90 capsule 3  . triamcinolone cream (KENALOG) 0.1 % Apply 1 application 2 (two) times  daily topically. 30 g 0  . Vitamin D, Ergocalciferol, (DRISDOL) 50000 units CAPS capsule Take 1 capsule (50,000 Units total) by mouth every 7 (seven) days. 12 capsule 0   No current facility-administered medications for this encounter.     No Known Allergies    Social History   Socioeconomic History  . Marital status: Married    Spouse name: Not on file  . Number of children: Not on file  . Years of education: Not on file  . Highest education level: Not on file  Social Needs  . Financial resource strain: Not on file  . Food insecurity - worry: Not on file  . Food insecurity - inability: Not on file  . Transportation needs - medical: Not on file  .  Transportation needs - non-medical: Not on file  Occupational History  . Not on file  Tobacco Use  . Smoking status: Former Smoker    Types: Pipe    Last attempt to quit: 09/21/2008    Years since quitting: 9.3  . Smokeless tobacco: Former Systems developer    Quit date: 09/21/2008  Substance and Sexual Activity  . Alcohol use: No  . Drug use: No  . Sexual activity: No    Birth control/protection: None  Other Topics Concern  . Not on file  Social History Narrative   ** Merged History Encounter **          Family History  Problem Relation Age of Onset  . Arthritis Mother   . Heart disease Mother   . Heart attack Mother   . Other Father        smoker  . Hypertension Neg Hx        unknown  . Stroke Neg Hx        unknown    Vitals:   01/13/18 1325  BP: 120/72  Pulse: 70  SpO2: 98%  Weight: 206 lb (93.4 kg)     Wt Readings from Last 3 Encounters:  01/13/18 206 lb (93.4 kg)  12/20/17 220 lb (99.8 kg)  11/27/17 215 lb (97.5 kg)     PHYSICAL EXAM: General: Well appearing. No resp difficulty. HEENT: Normal Neck: Supple. JVP 5-6. Carotids 2+ bilat; no bruits. No thyromegaly or nodule noted. Cor: PMI nondisplaced. RRR, No M/G/R noted Lungs: CTAB, normal effort. Abdomen: Soft, non-tender, non-distended, no HSM. No bruits  or masses. +BS  Extremities: No cyanosis, clubbing, or rash. R and LLE no edema.  Neuro: Alert & orientedx3, cranial nerves grossly intact. moves all 4 extremities w/o difficulty. Affect pleasant   EKG NSR 66 bpm  ASSESSMENT & PLAN:  1. Chronic Systolic CHF, Echo 08/30/97 Echo improved to 55-60% from echo 10/05/15 LVEF 30-35% - Myoview 11/17 EF 39% - Echo 03/2017 LVEF 50-55%.  - NYHA class II.  - Volume status looks goods.  - Continue lasix 40 mg daily. Can take extra 40 mg as needed  - Continue carvedilol 3.125 mg BID.  - Continue spironolactone 25 mg daily.    - Continue losartan 25 mg daily. No outside of range for Entresto.  - Continue Bidil 2 tabs TID.  - Reinforced fluid restriction to < 2 L daily, sodium restriction to less than 2000 mg daily, and the importance of daily weights.   2. PAF  -  S/p DCCV 12/25/2017 - Remains in NSR - Continue amiodarone 200 mg daily. (Finished taper).  - Anticoagulated on Syvasa 60 mg daily. BMET today.  - Denies bleeding.  3. CAD s/p CABG - Myoview 10/2016 with no significant ischemia - No s/s of ischemia.  - Continue medical therapy.  4. HTN - Stable on current medications.  5. CKD stage III - Last creatinine 0.80.  6. Gout - On allopurinol 200 mg daily. Per PCP.  7. Learning disability  - Previously paramedicine patient but graduated.   Labs today. RTC 3-4 months.   Louis Friar, PA-C  1:34 PM   Greater than 50% of the 25 minute visit was spent in counseling/coordination of care regarding disease state education, salt/fluid restriction, sliding scale diuretics, and medication compliance.

## 2018-01-13 NOTE — Patient Instructions (Signed)
No changes to medication at this time.  Follow up 3-4 months with Dr. Haroldine Laws.  Take all medication as prescribed the day of your appointment. Bring all medications with you to your appointment.  Do the following things EVERYDAY: 1) Weigh yourself in the morning before breakfast. Write it down and keep it in a log. 2) Take your medicines as prescribed 3) Eat low salt foods-Limit salt (sodium) to 2000 mg per day.  4) Stay as active as you can everyday 5) Limit all fluids for the day to less than 2 liters

## 2018-01-14 DIAGNOSIS — I110 Hypertensive heart disease with heart failure: Secondary | ICD-10-CM | POA: Diagnosis not present

## 2018-01-14 DIAGNOSIS — N400 Enlarged prostate without lower urinary tract symptoms: Secondary | ICD-10-CM | POA: Diagnosis not present

## 2018-01-14 DIAGNOSIS — Z9181 History of falling: Secondary | ICD-10-CM | POA: Diagnosis not present

## 2018-01-14 DIAGNOSIS — Z8673 Personal history of transient ischemic attack (TIA), and cerebral infarction without residual deficits: Secondary | ICD-10-CM | POA: Diagnosis not present

## 2018-01-14 DIAGNOSIS — I482 Chronic atrial fibrillation: Secondary | ICD-10-CM | POA: Diagnosis not present

## 2018-01-14 DIAGNOSIS — I255 Ischemic cardiomyopathy: Secondary | ICD-10-CM | POA: Diagnosis not present

## 2018-01-14 DIAGNOSIS — M6281 Muscle weakness (generalized): Secondary | ICD-10-CM | POA: Diagnosis not present

## 2018-01-14 DIAGNOSIS — I5022 Chronic systolic (congestive) heart failure: Secondary | ICD-10-CM | POA: Diagnosis not present

## 2018-01-15 ENCOUNTER — Telehealth (HOSPITAL_COMMUNITY): Payer: Self-pay

## 2018-01-15 DIAGNOSIS — I110 Hypertensive heart disease with heart failure: Secondary | ICD-10-CM | POA: Diagnosis not present

## 2018-01-15 DIAGNOSIS — M6281 Muscle weakness (generalized): Secondary | ICD-10-CM | POA: Diagnosis not present

## 2018-01-15 DIAGNOSIS — I255 Ischemic cardiomyopathy: Secondary | ICD-10-CM | POA: Diagnosis not present

## 2018-01-15 DIAGNOSIS — Z8673 Personal history of transient ischemic attack (TIA), and cerebral infarction without residual deficits: Secondary | ICD-10-CM | POA: Diagnosis not present

## 2018-01-15 DIAGNOSIS — I5022 Chronic systolic (congestive) heart failure: Secondary | ICD-10-CM | POA: Diagnosis not present

## 2018-01-15 DIAGNOSIS — Z9181 History of falling: Secondary | ICD-10-CM | POA: Diagnosis not present

## 2018-01-15 DIAGNOSIS — I482 Chronic atrial fibrillation: Secondary | ICD-10-CM | POA: Diagnosis not present

## 2018-01-15 DIAGNOSIS — N400 Enlarged prostate without lower urinary tract symptoms: Secondary | ICD-10-CM | POA: Diagnosis not present

## 2018-01-15 NOTE — Telephone Encounter (Signed)
Patient made aware ===View-only below this line===  ----- Message ----- From: Shirley Friar, PA-C Sent: 01/14/2018   7:57 AM To: Effie Berkshire, RN, Kerry Dory, CMA   Would you please let Mr. Brouillard know we are OK with him holding Syvas 24 hours prior to tooth extraction?  Thanks!  ----- Message ----- From: [name], MD Sent: 01/13/2018   6:26 PM To: Shirley Friar, PA-C  24 hours  ----- Message ----- From: Annamaria Helling Sent: 01/13/2018   2:14 PM To: [name], MD, *    How long should we hold Sayvas for tooth extraction??

## 2018-01-16 ENCOUNTER — Telehealth: Payer: Self-pay | Admitting: Internal Medicine

## 2018-01-16 NOTE — Telephone Encounter (Signed)
Ok

## 2018-01-16 NOTE — Telephone Encounter (Signed)
Copied from [place] [id number]. Topic: Quick Communication - See Telephone Encounter >> Jan 16, 2018 10:32 AM Arletha Grippe wrote: CRM for notification. See Telephone encounter for:   01/16/18. Anda Kraft white physical therapist called - verbal order request Physical therapy 2 week 8 Cb# [phone] [facility]

## 2018-01-16 NOTE — Telephone Encounter (Signed)
Verbals given  

## 2018-01-17 DIAGNOSIS — M6281 Muscle weakness (generalized): Secondary | ICD-10-CM | POA: Diagnosis not present

## 2018-01-17 DIAGNOSIS — I255 Ischemic cardiomyopathy: Secondary | ICD-10-CM | POA: Diagnosis not present

## 2018-01-17 DIAGNOSIS — I110 Hypertensive heart disease with heart failure: Secondary | ICD-10-CM | POA: Diagnosis not present

## 2018-01-17 DIAGNOSIS — Z8673 Personal history of transient ischemic attack (TIA), and cerebral infarction without residual deficits: Secondary | ICD-10-CM | POA: Diagnosis not present

## 2018-01-17 DIAGNOSIS — N400 Enlarged prostate without lower urinary tract symptoms: Secondary | ICD-10-CM | POA: Diagnosis not present

## 2018-01-17 DIAGNOSIS — Z9181 History of falling: Secondary | ICD-10-CM | POA: Diagnosis not present

## 2018-01-17 DIAGNOSIS — I5022 Chronic systolic (congestive) heart failure: Secondary | ICD-10-CM | POA: Diagnosis not present

## 2018-01-17 DIAGNOSIS — I482 Chronic atrial fibrillation: Secondary | ICD-10-CM | POA: Diagnosis not present

## 2018-01-17 NOTE — Telephone Encounter (Signed)
[facility] needs another verbal order for nursing and social worker, contact [facility] to advise; leave a VM if no one answers

## 2018-01-17 NOTE — Telephone Encounter (Signed)
Calling back, [facility]

## 2018-01-17 NOTE — Telephone Encounter (Signed)
Tried to call Louis Ford back no answer and can't leave msg due to vm being full...[name]

## 2018-01-17 NOTE — Telephone Encounter (Signed)
Fine

## 2018-01-20 DIAGNOSIS — M6281 Muscle weakness (generalized): Secondary | ICD-10-CM | POA: Diagnosis not present

## 2018-01-20 DIAGNOSIS — I110 Hypertensive heart disease with heart failure: Secondary | ICD-10-CM | POA: Diagnosis not present

## 2018-01-20 DIAGNOSIS — I482 Chronic atrial fibrillation: Secondary | ICD-10-CM | POA: Diagnosis not present

## 2018-01-20 DIAGNOSIS — Z9181 History of falling: Secondary | ICD-10-CM | POA: Diagnosis not present

## 2018-01-20 DIAGNOSIS — Z8673 Personal history of transient ischemic attack (TIA), and cerebral infarction without residual deficits: Secondary | ICD-10-CM | POA: Diagnosis not present

## 2018-01-20 DIAGNOSIS — I5022 Chronic systolic (congestive) heart failure: Secondary | ICD-10-CM | POA: Diagnosis not present

## 2018-01-20 DIAGNOSIS — N400 Enlarged prostate without lower urinary tract symptoms: Secondary | ICD-10-CM | POA: Diagnosis not present

## 2018-01-20 DIAGNOSIS — I255 Ischemic cardiomyopathy: Secondary | ICD-10-CM | POA: Diagnosis not present

## 2018-01-20 NOTE — Telephone Encounter (Signed)
Called Lisa no answer LMOM w/MD response.Marland KitchenJohny Chess

## 2018-01-22 ENCOUNTER — Telehealth: Payer: Self-pay | Admitting: Cardiology

## 2018-01-22 ENCOUNTER — Encounter: Payer: Medicare Other | Admitting: *Deleted

## 2018-01-22 DIAGNOSIS — I255 Ischemic cardiomyopathy: Secondary | ICD-10-CM | POA: Diagnosis not present

## 2018-01-22 DIAGNOSIS — I5022 Chronic systolic (congestive) heart failure: Secondary | ICD-10-CM | POA: Diagnosis not present

## 2018-01-22 DIAGNOSIS — I482 Chronic atrial fibrillation: Secondary | ICD-10-CM | POA: Diagnosis not present

## 2018-01-22 DIAGNOSIS — Z9181 History of falling: Secondary | ICD-10-CM | POA: Diagnosis not present

## 2018-01-22 DIAGNOSIS — M6281 Muscle weakness (generalized): Secondary | ICD-10-CM | POA: Diagnosis not present

## 2018-01-22 DIAGNOSIS — Z8673 Personal history of transient ischemic attack (TIA), and cerebral infarction without residual deficits: Secondary | ICD-10-CM | POA: Diagnosis not present

## 2018-01-22 DIAGNOSIS — N400 Enlarged prostate without lower urinary tract symptoms: Secondary | ICD-10-CM | POA: Diagnosis not present

## 2018-01-22 DIAGNOSIS — I110 Hypertensive heart disease with heart failure: Secondary | ICD-10-CM | POA: Diagnosis not present

## 2018-01-22 NOTE — Telephone Encounter (Signed)
LMOVM reminding pt to send remote transmission.   

## 2018-01-24 ENCOUNTER — Encounter: Payer: Self-pay | Admitting: Cardiology

## 2018-01-27 ENCOUNTER — Encounter: Payer: Self-pay | Admitting: Neurology

## 2018-01-27 ENCOUNTER — Ambulatory Visit: Payer: Medicare Other | Admitting: Neurology

## 2018-01-27 ENCOUNTER — Other Ambulatory Visit: Payer: Medicare Other

## 2018-01-27 VITALS — BP 96/74 | HR 84 | Ht 71.0 in | Wt 215.0 lb

## 2018-01-27 DIAGNOSIS — N400 Enlarged prostate without lower urinary tract symptoms: Secondary | ICD-10-CM | POA: Diagnosis not present

## 2018-01-27 DIAGNOSIS — I110 Hypertensive heart disease with heart failure: Secondary | ICD-10-CM | POA: Diagnosis not present

## 2018-01-27 DIAGNOSIS — G40209 Localization-related (focal) (partial) symptomatic epilepsy and epileptic syndromes with complex partial seizures, not intractable, without status epilepticus: Secondary | ICD-10-CM

## 2018-01-27 DIAGNOSIS — Z9181 History of falling: Secondary | ICD-10-CM | POA: Diagnosis not present

## 2018-01-27 DIAGNOSIS — W19XXXA Unspecified fall, initial encounter: Secondary | ICD-10-CM

## 2018-01-27 DIAGNOSIS — M6281 Muscle weakness (generalized): Secondary | ICD-10-CM | POA: Diagnosis not present

## 2018-01-27 DIAGNOSIS — I5022 Chronic systolic (congestive) heart failure: Secondary | ICD-10-CM | POA: Diagnosis not present

## 2018-01-27 DIAGNOSIS — I255 Ischemic cardiomyopathy: Secondary | ICD-10-CM | POA: Diagnosis not present

## 2018-01-27 DIAGNOSIS — Z8673 Personal history of transient ischemic attack (TIA), and cerebral infarction without residual deficits: Secondary | ICD-10-CM | POA: Diagnosis not present

## 2018-01-27 DIAGNOSIS — I482 Chronic atrial fibrillation: Secondary | ICD-10-CM | POA: Diagnosis not present

## 2018-01-27 MED ORDER — LEVETIRACETAM 1000 MG PO TABS: 1000.0000 mg | ORAL_TABLET | Freq: Two times a day (BID) | ORAL | 11 refills | Status: DC

## 2018-01-27 NOTE — Patient Instructions (Addendum)
1. Schedule head CT without contrast 2. Bloodwork for Dilantin level  Your provider has requested that you have labwork completed today. Please go to [facility] ([id number]) on the second floor of this building before leaving the office today. You do not need to check in. If you are not called within 15 minutes please check with the front desk.   3. Continue Keppra 1000mg  twice a day. Continue Dilantin 300mg , take 1 cap at night for now, our office will call you with blood test results and any changes we may need to do 4. Refer to [facility] for therapy for depression 5. Continue with PT, use walker for balance 6. Follow-up in 4 months, call for any changes  Seizure Precautions: 1. If medication has been prescribed for you to prevent seizures, take it exactly as directed.  Do not stop taking the medicine without talking to your doctor first, even if you have not had a seizure in a long time.   2. Avoid activities in which a seizure would cause danger to yourself or to others.  Don't operate dangerous machinery, swim alone, or climb in high or dangerous places, such as on ladders, roofs, or girders.  Do not drive unless your doctor says you may.  3. If you have any warning that you may have a seizure, lay down in a safe place where you can't hurt yourself.    4.  No driving for 6 months from last seizure, as per [facility].   Please refer to the following link on the [place] website for more information: http://[email or web address]/[email or web address]   5.  Maintain good sleep hygiene. Avoid alcohol.  6.  Contact your doctor if you have any problems that may be related to the medicine you are taking.  7.  Call 911 and bring the patient back to the ED if:        A.  The seizure lasts longer than 5 minutes.       B.  The patient doesn't awaken shortly after the seizure  C.  The patient has new problems such as  difficulty seeing, speaking or moving  D.  The patient was injured during the seizure  E.  The patient has a temperature over 102 F (39C)  F.  The patient vomited and now is having trouble breathing

## 2018-01-27 NOTE — Progress Notes (Signed)
NEUROLOGY FOLLOW UP OFFICE NOTE  Louis Ford. [id number] 05/19/1946  HISTORY OF PRESENT ILLNESS: I had the pleasure of seeing Louis Ford in follow-up in the neurology clinic on 02/03/2018.  The patient was last seen 4 months ago for seizures. He is again accompanied by his wife who helps supplement the history today.  Records and images were personally reviewed where available. His wife reported to her PCP last December 2018 that he was having hallucinations since starting Keppra. His wife did not contact our office about this. She reported seeing bugs during day or night, spraying at the several times a week. Dilantin level done 12/20/17 was 28.2 His wife was called to have a repeat trough Dilantin level, but this has not been done.  They deny any further seizures since November 2018. He is on Dilantin 300mg  daily and Keppra 1000mg  BID. He still sees the bugs and sprays at them. He would see a patterned surface and think it is moving. No other types of hallucinations. He has been feeling dizzy and off balance, and has had several falls. He fell last week and has bilateral black eyes today with a goose egg on the right frontal region. They have PT coming twice a week and a nurse coming weekly. He denies any diplopia. 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 He moved to [place] 3 years ago and comes today with his wife. He reports seizures started in his early 70s while he was living in [place]. He also reports being told he had a stroke but denied any deficits from it. He has had several hospital/ER visits to [facility] since 2013, records were reviewed. He denies any prior warning to the seizures. He has been married to his wife for a year,  she has only seen 2 seizures, when he was admitted last 72 2018, and one a week ago at 70am. She describes humming, unresponsiveness, body stiff but no convulsive activity. Records from prior ER visits from 2013 to 2015 indicate staring spells and generalized tonic-clonic seizures. The last ER visit for seizure prior to September 2018 was in October 2016. He was previously on Keppra and Dilantin at some point in [place], and when he moved here and had the seizures initially, he was not taking seizure medication. Dilantin appears to have been prescribed from the ER at some point. He has been taking Dilantin 100mg  BID and denied missing medication when he was admitted for breakthrough seizures last 09/08/17. His wife reports he had 2 seizures where he was humming and unresponsive for a few minutes. In the ER, he had 2 witnessed complex partial seizures with note of diaphoresis, humming, followed by staring off, lip smacking, and picking objects. This lasted 5 minutes, he received 2 doses of Ativan. His awake and drowsy EEG showed diffuse beta activity, no epileptiform discharges. His head CT did not show any acute changes, there was chronic stable microvascular disease. He had a head CT in October 2013 with note of encephalomalacia in the medial left temporal lobe, this is slightly visible on last brain imaging. His Dilantin level was 10, he was discharged home on higher dose Dilantin 300mg  qhs and Keppra 750mg  BID. He denies any side effects to the medications. His  wife administers medications. She reports one more seizures since hospitalization that occurred a week ago at 1am while in bed, he became diaphoretic, said it was hot and turned the fan on, then started humming for a few minutes.   He denies any dizziness, diplopia, dysarthria/dysphagia, neck/back pain, bowel/bladder dysfunction. He has a little shakiness in both hands. He denies any gaps in time, olfactory/gustatory hallucinations, deja vu,  rising epigastric sensation, focal numbness/tingling/weakness, myoclonic jerks. He reports waking up today feeling a little left-sided chest pain and warm feeling in his left arm. He reports a sensation in his head like there is water going from side to side, he has occasional left-sided head pain. He does not sleep well, he is up at night watching TV, goes to bed from 4am to 10am. He denies any alcohol use. He feels his memory is good. His wife has taken over medications since his hospital stay, he would take his medication in the pillbox and want to fill it back up and take again, or sometimes he would not take all the medications in the box.   Epilepsy Risk Factors:  History of stroke. There is medial left temporal encephalomalacia on head CT. Otherwise he had a normal birth and early development.  There is no history of febrile convulsions, CNS infections such as meningitis/encephalitis, significant traumatic brain injury, neurosurgical procedures, or family history of seizures.  PAST MEDICAL HISTORY: Past Medical History:  Diagnosis Date  . AICD (automatic cardioverter/defibrillator) present   . Atrial fibrillation   09/22/2012  . Blood loss anemia 04/18/2017   After GI bleed from colonoscopy and polypectomy  . CAD (coronary artery disease)   . Chronic systolic heart failure ([place])   . Factor VII deficiency ([place]) 05/2011  . Factor VII deficiency ([place]) 10/07/2012  . GIB (gastrointestinal bleeding) 02/20/2017  . History of MRSA infection 05/2011  . HTN (hypertension)   . Hx of adenomatous colonic polyps 02/20/2017   01/2017 - 3 cm sigmoid TV adenoma and other smaller polyps - had post-polypectomy bleed Tx w/ clips Consider repeat colonoscopy 3 yrs [name], MD, [name]   . Hyperlipidemia   . implantable cardiac defibrillator-Biotronik    Device Implanted 2006; s/p gen change 03/2011 : bleeding persistent with pocket erosion and infection; explant and reimplant  06/2011  . Ischemic  cardiomyopathy    EF 15 to 20% by TTE and TEE in 09/2012.  Severe LV dysfunction  . Obesity    BMI 31 in 09/2012  . Persistent atrial fibrillation with rapid ventricular response ([place]) 04/21/2014  . Seizure disorder ([employer]) latest 09/30/2012  . Seizures ([place])   . Stroke [facility])     MEDICATIONS: Current Outpatient Medications on File Prior to Visit  Medication Sig Dispense Refill  . acetaminophen (TYLENOL) 325 MG tablet Take 650 mg by mouth every 6 (six) hours as needed for mild pain.    [name] amiodarone (PACERONE) 200 MG tablet Take 1 tablet (200 mg total) by mouth daily. 30 tablet 3  . BIDIL 20-37.5 MG tablet TAKE TWO TABLETS BY MOUTH THREE TIMES DAILY 180 tablet 6  . carvedilol (COREG) 12.5 MG tablet Take 1 tablet (12.5 mg total) by mouth 2 (two) times daily with a meal. 60 tablet 0  . diclofenac sodium (VOLTAREN) 1 % GEL Apply 4 g topically 4 (four) times daily as needed. 100 g 5  . DULoxetine (CYMBALTA) 30 MG capsule Take 1 capsule (30 mg total) by mouth daily. 30 capsule 11  . ferrous sulfate  325 (65 FE) MG tablet Take 1 tablet (325 mg total) 2 (two) times daily with a meal by mouth. 60 tablet 3  . furosemide (LASIX) 40 MG tablet TAKE ONE TABLET BY MOUTH ONCE DAILY (Patient taking differently: TAKE 40mg  BY MOUTH ONCE DAILY) 90 tablet 3  . levETIRAcetam (KEPPRA) 1000 MG tablet Take 1 tablet (1,000 mg total) by mouth 2 (two) times daily. 60 tablet 11  . losartan (COZAAR) 25 MG tablet TAKE ONE TABLET BY MOUTH ONCE DAILY (Patient taking differently: TAKE 25mg  BY MOUTH ONCE DAILY) 90 tablet 3  . nystatin (MYCOSTATIN) 100000 UNIT/ML suspension SWISH, GARGLE AND SPIT OUT 5-10 MILLILITERS EVERY 6 HOURS AS NEEDED 240 mL 11  . phenytoin (DILANTIN) 300 MG ER capsule Take 1 capsule (300 mg total) by mouth at bedtime. 30 capsule 11  . polyethylene glycol (MIRALAX / GLYCOLAX) packet Take 17 g by mouth daily as needed. 14 each 0  . SAVAYSA 60 MG TABS tablet TAKE 1 TABLET BY MOUTH ONCE DAILY (Patient taking  differently: TAKE 60mg  BY MOUTH ONCE DAILY) 30 tablet 6  . senna-docusate (SENOKOT-S) 8.6-50 MG tablet Take 2 tablets by mouth 2 (two) times daily. 10 tablet 0  . simvastatin (ZOCOR) 20 MG tablet Take 1 tablet (20 mg total) by mouth daily at 6 PM. Overdue for annual appt w/labs must make appt for future refills 90 tablet 1  . spironolactone (ALDACTONE) 25 MG tablet Take 1 tablet (25 mg total) by mouth daily. 30 tablet 0  . tamsulosin (FLOMAX) 0.4 MG CAPS capsule Take 1 capsule (0.4 mg total) daily by mouth. 90 capsule 3  . triamcinolone cream (KENALOG) 0.1 % Apply 1 application 2 (two) times daily topically. 30 g 0  . Vitamin D, Ergocalciferol, (DRISDOL) 50000 units CAPS capsule Take 1 capsule (50,000 Units total) by mouth every 7 (seven) days. 12 capsule 0   No current facility-administered medications on file prior to visit.     ALLERGIES: No Known Allergies  FAMILY HISTORY: Family History  Problem Relation Age of Onset  . Arthritis Mother   . Heart disease Mother   . Heart attack Mother   . Other Father        smoker  . Hypertension Neg Hx        unknown  . Stroke Neg Hx        unknown    SOCIAL HISTORY: Social History   Socioeconomic History  . Marital status: Married    Spouse name: Not on file  . Number of children: Not on file  . Years of education: Not on file  . Highest education level: Not on file  Social Needs  . Financial resource strain: Not on file  . Food insecurity - worry: Not on file  . Food insecurity - inability: Not on file  . Transportation needs - medical: Not on file  . Transportation needs - non-medical: Not on file  Occupational History  . Not on file  Tobacco Use  . Smoking status: Former Smoker    Types: Pipe    Last attempt to quit: 09/21/2008    Years since quitting: 9.3  . Smokeless tobacco: Former Systems developer    Quit date: 09/21/2008  Substance and Sexual Activity  . Alcohol use: No  . Drug use: No  . Sexual activity: No    Birth  control/protection: None  Other Topics Concern  . Not on file  Social History Narrative   ** Merged History Encounter **  REVIEW OF SYSTEMS: Constitutional: No fevers, chills, or sweats, no generalized fatigue, change in appetite Eyes: No visual changes, double vision, eye pain Ear, nose and throat: No hearing loss, ear pain, nasal congestion, sore throat Cardiovascular: No chest pain, palpitations Respiratory:  No shortness of breath at rest or with exertion, wheezes GastrointestinaI: No nausea, vomiting, diarrhea, abdominal pain, fecal incontinence Genitourinary:  No dysuria, urinary retention or frequency Musculoskeletal:  No neck pain, back pain Integumentary: No rash, pruritus, skin lesions Neurological: as above Psychiatric: No depression, insomnia, anxiety Endocrine: No palpitations, fatigue, diaphoresis, mood swings, change in appetite, change in weight, increased thirst Hematologic/Lymphatic:  No anemia, purpura, petechiae. Allergic/Immunologic: no itchy/runny eyes, nasal congestion, recent allergic reactions, rashes  PHYSICAL EXAM: Vitals:   01/27/18 1406  BP: 96/74  Pulse: 84  SpO2: 98%   General: No acute distress Head:  Normocephalic, +ecchymosis around both eyes, right frontal swelling Neck: supple, no paraspinal tenderness, full range of motion Heart:  Regular rate and rhythm Lungs:  Clear to auscultation bilaterally Back: No paraspinal tenderness Skin/Extremities: No rash, no edema Neurological Exam: alert and oriented to person, place, and time. No aphasia or dysarthria. Fund of knowledge is appropriate.  Recent and remote memory are intact.  Attention and concentration are normal.    Able to name objects and repeat phrases. Cranial nerves: Pupils equal, round, reactive to light. Extraocular movements intact with no nystagmus. Visual fields full. Facial sensation intact. No facial asymmetry. Tongue, uvula, palate midline.  Motor: Bulk and tone normal,  muscle strength 5/5 throughout with no pronator drift.  Sensation to light touch intact.  No extinction to double simultaneous stimulation.  Deep tendon reflexes+1+ throughout, toes downgoing.  Finger to nose testing intact.  Gait unsteady.  Romberg negative.  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 Head CT showed medial left temporal encephalomalacia. Routine EEG unremarkable. He was admitted last September 2018 and had 4 focal seizures in one day. Last focal seizure reported was in November 2018, none since increasing Keppra to 1000mg  BID. His wife was concerned about visual hallucinations, his Dilantin level was supratherapeutic, he had been instructed to have this re-checked but did not follow through, he is now having falls and poor balance. We discussed doing another Dilantin level. He is on anticoagulation, head CT without contrast will be checked post-fall to assess for bleed. Continue current medications for now. Continue PT, use walker for balance. His wife would like him to see a counselor for depression, referral will be sent. He does not drive. He will follow-up in 4 months and knows to call for any changes  Thank you for allowing me to participate in his care.  Please do not hesitate to call for any questions or concerns.  The duration of this appointment visit was 25 minutes of face-to-face time with the patient.  Greater than 50% of this time was spent in counseling, explanation of diagnosis, planning of further management, and coordination of care.   Ellouise Newer, M.D.   CC: Dr. Sharlet Salina

## 2018-01-28 ENCOUNTER — Telehealth: Payer: Self-pay | Admitting: Neurology

## 2018-01-28 ENCOUNTER — Other Ambulatory Visit: Payer: Self-pay | Admitting: Neurology

## 2018-01-28 ENCOUNTER — Inpatient Hospital Stay: Payer: Medicare Other | Admitting: Internal Medicine

## 2018-01-28 DIAGNOSIS — I482 Chronic atrial fibrillation: Secondary | ICD-10-CM | POA: Diagnosis not present

## 2018-01-28 DIAGNOSIS — Z9181 History of falling: Secondary | ICD-10-CM | POA: Diagnosis not present

## 2018-01-28 DIAGNOSIS — I5022 Chronic systolic (congestive) heart failure: Secondary | ICD-10-CM | POA: Diagnosis not present

## 2018-01-28 DIAGNOSIS — Z8673 Personal history of transient ischemic attack (TIA), and cerebral infarction without residual deficits: Secondary | ICD-10-CM | POA: Diagnosis not present

## 2018-01-28 DIAGNOSIS — N400 Enlarged prostate without lower urinary tract symptoms: Secondary | ICD-10-CM | POA: Diagnosis not present

## 2018-01-28 DIAGNOSIS — M6281 Muscle weakness (generalized): Secondary | ICD-10-CM | POA: Diagnosis not present

## 2018-01-28 DIAGNOSIS — I255 Ischemic cardiomyopathy: Secondary | ICD-10-CM | POA: Diagnosis not present

## 2018-01-28 DIAGNOSIS — Z0289 Encounter for other administrative examinations: Secondary | ICD-10-CM

## 2018-01-28 DIAGNOSIS — I110 Hypertensive heart disease with heart failure: Secondary | ICD-10-CM | POA: Diagnosis not present

## 2018-01-28 LAB — PHENYTOIN LEVEL, TOTAL: Phenytoin, Total: 33.1 mg/L — ABNORMAL HIGH (ref 10.0–20.0)

## 2018-01-28 MED ORDER — PHENYTOIN SODIUM EXTENDED 100 MG PO CAPS: ORAL_CAPSULE | ORAL | 6 refills | Status: DC

## 2018-01-28 NOTE — Telephone Encounter (Signed)
Spoke to wife about elevated Dilantin level. Instructed her to hold Dilantin for 3 days, then check Dilantin level on Friday. After this, we will plan to be on Dilantin 100mg , 2 caps daily. Continue Keppra 1000mg  BID. Wife expressed understanding.  Meagen, pls order total Dilantin level for Friday as stat so we can get results (hopefully) by end of day. His wife plans to bring him first thing in the morning. Thanks

## 2018-01-29 DIAGNOSIS — I482 Chronic atrial fibrillation: Secondary | ICD-10-CM | POA: Diagnosis not present

## 2018-01-29 DIAGNOSIS — Z9181 History of falling: Secondary | ICD-10-CM | POA: Diagnosis not present

## 2018-01-29 DIAGNOSIS — I255 Ischemic cardiomyopathy: Secondary | ICD-10-CM | POA: Diagnosis not present

## 2018-01-29 DIAGNOSIS — M6281 Muscle weakness (generalized): Secondary | ICD-10-CM | POA: Diagnosis not present

## 2018-01-29 DIAGNOSIS — N400 Enlarged prostate without lower urinary tract symptoms: Secondary | ICD-10-CM | POA: Diagnosis not present

## 2018-01-29 DIAGNOSIS — I110 Hypertensive heart disease with heart failure: Secondary | ICD-10-CM | POA: Diagnosis not present

## 2018-01-29 DIAGNOSIS — I5022 Chronic systolic (congestive) heart failure: Secondary | ICD-10-CM | POA: Diagnosis not present

## 2018-01-29 DIAGNOSIS — Z8673 Personal history of transient ischemic attack (TIA), and cerebral infarction without residual deficits: Secondary | ICD-10-CM | POA: Diagnosis not present

## 2018-01-31 ENCOUNTER — Telehealth: Payer: Self-pay

## 2018-01-31 ENCOUNTER — Other Ambulatory Visit: Payer: Medicare Other

## 2018-01-31 DIAGNOSIS — R569 Unspecified convulsions: Secondary | ICD-10-CM | POA: Diagnosis not present

## 2018-01-31 DIAGNOSIS — R55 Syncope and collapse: Secondary | ICD-10-CM

## 2018-01-31 DIAGNOSIS — G40209 Localization-related (focal) (partial) symptomatic epilepsy and epileptic syndromes with complex partial seizures, not intractable, without status epilepticus: Secondary | ICD-10-CM

## 2018-01-31 NOTE — Telephone Encounter (Signed)
STAT orders placed for Dilantin levels.

## 2018-02-01 LAB — PHENYTOIN LEVEL, TOTAL: PHENYTOIN, TOTAL: 23.9 mg/L — AB (ref 10.0–20.0)

## 2018-02-03 ENCOUNTER — Telehealth: Payer: Self-pay | Admitting: Neurology

## 2018-02-03 ENCOUNTER — Encounter: Payer: Self-pay | Admitting: Neurology

## 2018-02-03 ENCOUNTER — Telehealth: Payer: Self-pay

## 2018-02-03 DIAGNOSIS — Z8673 Personal history of transient ischemic attack (TIA), and cerebral infarction without residual deficits: Secondary | ICD-10-CM | POA: Diagnosis not present

## 2018-02-03 DIAGNOSIS — I5022 Chronic systolic (congestive) heart failure: Secondary | ICD-10-CM | POA: Diagnosis not present

## 2018-02-03 DIAGNOSIS — I255 Ischemic cardiomyopathy: Secondary | ICD-10-CM | POA: Diagnosis not present

## 2018-02-03 DIAGNOSIS — I482 Chronic atrial fibrillation: Secondary | ICD-10-CM | POA: Diagnosis not present

## 2018-02-03 DIAGNOSIS — N400 Enlarged prostate without lower urinary tract symptoms: Secondary | ICD-10-CM | POA: Diagnosis not present

## 2018-02-03 DIAGNOSIS — I110 Hypertensive heart disease with heart failure: Secondary | ICD-10-CM | POA: Diagnosis not present

## 2018-02-03 DIAGNOSIS — M6281 Muscle weakness (generalized): Secondary | ICD-10-CM | POA: Diagnosis not present

## 2018-02-03 DIAGNOSIS — Z9181 History of falling: Secondary | ICD-10-CM | POA: Diagnosis not present

## 2018-02-03 NOTE — Telephone Encounter (Signed)
Mardene Celeste left message on the answering machine and states patient needs a refill but I could not make out the name of the medication so I called to let her know

## 2018-02-03 NOTE — Telephone Encounter (Signed)
Spoke with pt's wife, Mardene Celeste, relaying message below.

## 2018-02-03 NOTE — Telephone Encounter (Signed)
-----   Message from [name], MD sent at 02/03/2018  1:10 PM EST ----- Regarding: Dilantin  Pls let his wife know the Dilantin level has gone down, but is still slightly elevated. Hold medication for one more day, then tomorrow start the lower dose of Dilantin 100mg , take 2 caps daily. I have sent in the Rx. Thanks

## 2018-02-03 NOTE — Telephone Encounter (Signed)
This encounter was taken care of prior to VM being sent to me.

## 2018-02-04 ENCOUNTER — Telehealth: Payer: Self-pay

## 2018-02-04 DIAGNOSIS — I5022 Chronic systolic (congestive) heart failure: Secondary | ICD-10-CM | POA: Diagnosis not present

## 2018-02-04 DIAGNOSIS — N400 Enlarged prostate without lower urinary tract symptoms: Secondary | ICD-10-CM | POA: Diagnosis not present

## 2018-02-04 DIAGNOSIS — M6281 Muscle weakness (generalized): Secondary | ICD-10-CM | POA: Diagnosis not present

## 2018-02-04 DIAGNOSIS — Z9181 History of falling: Secondary | ICD-10-CM | POA: Diagnosis not present

## 2018-02-04 DIAGNOSIS — I482 Chronic atrial fibrillation: Secondary | ICD-10-CM | POA: Diagnosis not present

## 2018-02-04 DIAGNOSIS — I255 Ischemic cardiomyopathy: Secondary | ICD-10-CM | POA: Diagnosis not present

## 2018-02-04 DIAGNOSIS — Z8673 Personal history of transient ischemic attack (TIA), and cerebral infarction without residual deficits: Secondary | ICD-10-CM | POA: Diagnosis not present

## 2018-02-04 DIAGNOSIS — I110 Hypertensive heart disease with heart failure: Secondary | ICD-10-CM | POA: Diagnosis not present

## 2018-02-04 MED ORDER — PHENYTOIN SODIUM EXTENDED 100 MG PO CAPS: 100.0000 mg | ORAL_CAPSULE | Freq: Two times a day (BID) | ORAL | 11 refills | Status: DC

## 2018-02-05 DIAGNOSIS — N400 Enlarged prostate without lower urinary tract symptoms: Secondary | ICD-10-CM | POA: Diagnosis not present

## 2018-02-05 DIAGNOSIS — I255 Ischemic cardiomyopathy: Secondary | ICD-10-CM | POA: Diagnosis not present

## 2018-02-05 DIAGNOSIS — I482 Chronic atrial fibrillation: Secondary | ICD-10-CM | POA: Diagnosis not present

## 2018-02-05 DIAGNOSIS — I110 Hypertensive heart disease with heart failure: Secondary | ICD-10-CM | POA: Diagnosis not present

## 2018-02-05 DIAGNOSIS — M6281 Muscle weakness (generalized): Secondary | ICD-10-CM | POA: Diagnosis not present

## 2018-02-05 DIAGNOSIS — I5022 Chronic systolic (congestive) heart failure: Secondary | ICD-10-CM | POA: Diagnosis not present

## 2018-02-05 DIAGNOSIS — Z8673 Personal history of transient ischemic attack (TIA), and cerebral infarction without residual deficits: Secondary | ICD-10-CM | POA: Diagnosis not present

## 2018-02-05 DIAGNOSIS — Z9181 History of falling: Secondary | ICD-10-CM | POA: Diagnosis not present

## 2018-02-10 ENCOUNTER — Ambulatory Visit
Admission: RE | Admit: 2018-02-10 | Discharge: 2018-02-10 | Disposition: A | Discharge status: Home / Self Care | Payer: Medicare Other | Source: Ambulatory Visit | Attending: Neurology | Admitting: Neurology

## 2018-02-10 DIAGNOSIS — W19XXXA Unspecified fall, initial encounter: Secondary | ICD-10-CM

## 2018-02-10 DIAGNOSIS — R42 Dizziness and giddiness: Secondary | ICD-10-CM | POA: Diagnosis not present

## 2018-02-10 DIAGNOSIS — N400 Enlarged prostate without lower urinary tract symptoms: Secondary | ICD-10-CM | POA: Diagnosis not present

## 2018-02-10 DIAGNOSIS — G40209 Localization-related (focal) (partial) symptomatic epilepsy and epileptic syndromes with complex partial seizures, not intractable, without status epilepticus: Secondary | ICD-10-CM

## 2018-02-10 DIAGNOSIS — M6281 Muscle weakness (generalized): Secondary | ICD-10-CM | POA: Diagnosis not present

## 2018-02-10 DIAGNOSIS — I5022 Chronic systolic (congestive) heart failure: Secondary | ICD-10-CM | POA: Diagnosis not present

## 2018-02-10 DIAGNOSIS — I255 Ischemic cardiomyopathy: Secondary | ICD-10-CM | POA: Diagnosis not present

## 2018-02-10 DIAGNOSIS — Z8673 Personal history of transient ischemic attack (TIA), and cerebral infarction without residual deficits: Secondary | ICD-10-CM | POA: Diagnosis not present

## 2018-02-10 DIAGNOSIS — I482 Chronic atrial fibrillation: Secondary | ICD-10-CM | POA: Diagnosis not present

## 2018-02-10 DIAGNOSIS — I110 Hypertensive heart disease with heart failure: Secondary | ICD-10-CM | POA: Diagnosis not present

## 2018-02-10 DIAGNOSIS — Z9181 History of falling: Secondary | ICD-10-CM | POA: Diagnosis not present

## 2018-02-11 DIAGNOSIS — M6281 Muscle weakness (generalized): Secondary | ICD-10-CM | POA: Diagnosis not present

## 2018-02-11 DIAGNOSIS — Z9181 History of falling: Secondary | ICD-10-CM | POA: Diagnosis not present

## 2018-02-11 DIAGNOSIS — I5022 Chronic systolic (congestive) heart failure: Secondary | ICD-10-CM | POA: Diagnosis not present

## 2018-02-11 DIAGNOSIS — I110 Hypertensive heart disease with heart failure: Secondary | ICD-10-CM | POA: Diagnosis not present

## 2018-02-11 DIAGNOSIS — Z8673 Personal history of transient ischemic attack (TIA), and cerebral infarction without residual deficits: Secondary | ICD-10-CM | POA: Diagnosis not present

## 2018-02-11 DIAGNOSIS — I255 Ischemic cardiomyopathy: Secondary | ICD-10-CM | POA: Diagnosis not present

## 2018-02-11 DIAGNOSIS — I482 Chronic atrial fibrillation: Secondary | ICD-10-CM | POA: Diagnosis not present

## 2018-02-11 DIAGNOSIS — N400 Enlarged prostate without lower urinary tract symptoms: Secondary | ICD-10-CM | POA: Diagnosis not present

## 2018-02-12 DIAGNOSIS — I482 Chronic atrial fibrillation: Secondary | ICD-10-CM | POA: Diagnosis not present

## 2018-02-12 DIAGNOSIS — Z9181 History of falling: Secondary | ICD-10-CM | POA: Diagnosis not present

## 2018-02-12 DIAGNOSIS — G4089 Other seizures: Secondary | ICD-10-CM | POA: Diagnosis not present

## 2018-02-12 DIAGNOSIS — N390 Urinary tract infection, site not specified: Secondary | ICD-10-CM

## 2018-02-12 DIAGNOSIS — N401 Enlarged prostate with lower urinary tract symptoms: Secondary | ICD-10-CM | POA: Diagnosis not present

## 2018-02-12 DIAGNOSIS — I255 Ischemic cardiomyopathy: Secondary | ICD-10-CM | POA: Diagnosis not present

## 2018-02-12 DIAGNOSIS — I5022 Chronic systolic (congestive) heart failure: Secondary | ICD-10-CM | POA: Diagnosis not present

## 2018-02-12 DIAGNOSIS — I110 Hypertensive heart disease with heart failure: Secondary | ICD-10-CM | POA: Diagnosis not present

## 2018-02-12 DIAGNOSIS — I481 Persistent atrial fibrillation: Secondary | ICD-10-CM

## 2018-02-12 DIAGNOSIS — I10 Essential (primary) hypertension: Secondary | ICD-10-CM | POA: Diagnosis not present

## 2018-02-12 DIAGNOSIS — Z8673 Personal history of transient ischemic attack (TIA), and cerebral infarction without residual deficits: Secondary | ICD-10-CM | POA: Diagnosis not present

## 2018-02-12 DIAGNOSIS — M6281 Muscle weakness (generalized): Secondary | ICD-10-CM | POA: Diagnosis not present

## 2018-02-12 DIAGNOSIS — N400 Enlarged prostate without lower urinary tract symptoms: Secondary | ICD-10-CM | POA: Diagnosis not present

## 2018-02-12 DIAGNOSIS — G40909 Epilepsy, unspecified, not intractable, without status epilepticus: Secondary | ICD-10-CM | POA: Diagnosis not present

## 2018-02-17 DIAGNOSIS — Z9181 History of falling: Secondary | ICD-10-CM | POA: Diagnosis not present

## 2018-02-17 DIAGNOSIS — I110 Hypertensive heart disease with heart failure: Secondary | ICD-10-CM | POA: Diagnosis not present

## 2018-02-17 DIAGNOSIS — I482 Chronic atrial fibrillation: Secondary | ICD-10-CM | POA: Diagnosis not present

## 2018-02-17 DIAGNOSIS — Z8673 Personal history of transient ischemic attack (TIA), and cerebral infarction without residual deficits: Secondary | ICD-10-CM | POA: Diagnosis not present

## 2018-02-17 DIAGNOSIS — I5022 Chronic systolic (congestive) heart failure: Secondary | ICD-10-CM | POA: Diagnosis not present

## 2018-02-17 DIAGNOSIS — I255 Ischemic cardiomyopathy: Secondary | ICD-10-CM | POA: Diagnosis not present

## 2018-02-17 DIAGNOSIS — M6281 Muscle weakness (generalized): Secondary | ICD-10-CM | POA: Diagnosis not present

## 2018-02-17 DIAGNOSIS — N400 Enlarged prostate without lower urinary tract symptoms: Secondary | ICD-10-CM | POA: Diagnosis not present

## 2018-02-19 DIAGNOSIS — I5022 Chronic systolic (congestive) heart failure: Secondary | ICD-10-CM | POA: Diagnosis not present

## 2018-02-19 DIAGNOSIS — N400 Enlarged prostate without lower urinary tract symptoms: Secondary | ICD-10-CM | POA: Diagnosis not present

## 2018-02-19 DIAGNOSIS — I110 Hypertensive heart disease with heart failure: Secondary | ICD-10-CM | POA: Diagnosis not present

## 2018-02-19 DIAGNOSIS — M6281 Muscle weakness (generalized): Secondary | ICD-10-CM | POA: Diagnosis not present

## 2018-02-19 DIAGNOSIS — Z8673 Personal history of transient ischemic attack (TIA), and cerebral infarction without residual deficits: Secondary | ICD-10-CM | POA: Diagnosis not present

## 2018-02-19 DIAGNOSIS — Z9181 History of falling: Secondary | ICD-10-CM | POA: Diagnosis not present

## 2018-02-19 DIAGNOSIS — I482 Chronic atrial fibrillation: Secondary | ICD-10-CM | POA: Diagnosis not present

## 2018-02-19 DIAGNOSIS — I255 Ischemic cardiomyopathy: Secondary | ICD-10-CM | POA: Diagnosis not present

## 2018-02-24 ENCOUNTER — Other Ambulatory Visit (HOSPITAL_COMMUNITY): Payer: Self-pay | Admitting: Internal Medicine

## 2018-02-24 DIAGNOSIS — M6281 Muscle weakness (generalized): Secondary | ICD-10-CM | POA: Diagnosis not present

## 2018-02-24 DIAGNOSIS — Z8673 Personal history of transient ischemic attack (TIA), and cerebral infarction without residual deficits: Secondary | ICD-10-CM | POA: Diagnosis not present

## 2018-02-24 DIAGNOSIS — I482 Chronic atrial fibrillation: Secondary | ICD-10-CM | POA: Diagnosis not present

## 2018-02-24 DIAGNOSIS — I5022 Chronic systolic (congestive) heart failure: Secondary | ICD-10-CM | POA: Diagnosis not present

## 2018-02-24 DIAGNOSIS — I110 Hypertensive heart disease with heart failure: Secondary | ICD-10-CM | POA: Diagnosis not present

## 2018-02-24 DIAGNOSIS — Z9181 History of falling: Secondary | ICD-10-CM | POA: Diagnosis not present

## 2018-02-24 DIAGNOSIS — I255 Ischemic cardiomyopathy: Secondary | ICD-10-CM | POA: Diagnosis not present

## 2018-02-24 DIAGNOSIS — N400 Enlarged prostate without lower urinary tract symptoms: Secondary | ICD-10-CM | POA: Diagnosis not present

## 2018-02-25 ENCOUNTER — Telehealth: Payer: Self-pay | Admitting: Internal Medicine

## 2018-02-25 NOTE — Telephone Encounter (Signed)
Copied from [place] [id number]. Topic: General - Other >> Feb 25, 2018  8:19 AM Darl Householder, RMA wrote: Reason for CRM: Maudie Mercury from [facility] is requesting verbal orders for 2 additional visits for skilled nursing, please return call to [place] at [phone]

## 2018-02-25 NOTE — Telephone Encounter (Signed)
Cannot give. He did not schedule visit with [place] within 30 days of last orders. He can try to call neurologist who he has seen in the last 3 months to see if they will oversee his orders or make visit with [place].

## 2018-02-25 NOTE — Telephone Encounter (Signed)
Notified Kim w/MD response../[email or web address] 

## 2018-02-26 DIAGNOSIS — M6281 Muscle weakness (generalized): Secondary | ICD-10-CM | POA: Diagnosis not present

## 2018-02-26 DIAGNOSIS — N400 Enlarged prostate without lower urinary tract symptoms: Secondary | ICD-10-CM | POA: Diagnosis not present

## 2018-02-26 DIAGNOSIS — Z9181 History of falling: Secondary | ICD-10-CM | POA: Diagnosis not present

## 2018-02-26 DIAGNOSIS — I255 Ischemic cardiomyopathy: Secondary | ICD-10-CM | POA: Diagnosis not present

## 2018-02-26 DIAGNOSIS — I482 Chronic atrial fibrillation: Secondary | ICD-10-CM | POA: Diagnosis not present

## 2018-02-26 DIAGNOSIS — I5022 Chronic systolic (congestive) heart failure: Secondary | ICD-10-CM | POA: Diagnosis not present

## 2018-02-26 DIAGNOSIS — Z8673 Personal history of transient ischemic attack (TIA), and cerebral infarction without residual deficits: Secondary | ICD-10-CM | POA: Diagnosis not present

## 2018-02-26 DIAGNOSIS — I110 Hypertensive heart disease with heart failure: Secondary | ICD-10-CM | POA: Diagnosis not present

## 2018-02-27 NOTE — Telephone Encounter (Signed)
Error

## 2018-03-03 DIAGNOSIS — I255 Ischemic cardiomyopathy: Secondary | ICD-10-CM | POA: Diagnosis not present

## 2018-03-03 DIAGNOSIS — Z9181 History of falling: Secondary | ICD-10-CM | POA: Diagnosis not present

## 2018-03-03 DIAGNOSIS — Z8673 Personal history of transient ischemic attack (TIA), and cerebral infarction without residual deficits: Secondary | ICD-10-CM | POA: Diagnosis not present

## 2018-03-03 DIAGNOSIS — I110 Hypertensive heart disease with heart failure: Secondary | ICD-10-CM | POA: Diagnosis not present

## 2018-03-03 DIAGNOSIS — I5022 Chronic systolic (congestive) heart failure: Secondary | ICD-10-CM | POA: Diagnosis not present

## 2018-03-03 DIAGNOSIS — N400 Enlarged prostate without lower urinary tract symptoms: Secondary | ICD-10-CM | POA: Diagnosis not present

## 2018-03-03 DIAGNOSIS — M6281 Muscle weakness (generalized): Secondary | ICD-10-CM | POA: Diagnosis not present

## 2018-03-03 DIAGNOSIS — I482 Chronic atrial fibrillation: Secondary | ICD-10-CM | POA: Diagnosis not present

## 2018-03-04 ENCOUNTER — Encounter: Payer: Self-pay | Admitting: Internal Medicine

## 2018-03-04 ENCOUNTER — Ambulatory Visit (INDEPENDENT_AMBULATORY_CARE_PROVIDER_SITE_OTHER): Payer: Medicare Other | Admitting: Internal Medicine

## 2018-03-04 VITALS — BP 120/64 | HR 76 | Temp 97.9°F | Ht 71.0 in | Wt 201.0 lb

## 2018-03-04 DIAGNOSIS — M79605 Pain in left leg: Secondary | ICD-10-CM

## 2018-03-04 DIAGNOSIS — M79606 Pain in leg, unspecified: Secondary | ICD-10-CM | POA: Insufficient documentation

## 2018-03-04 DIAGNOSIS — I5022 Chronic systolic (congestive) heart failure: Secondary | ICD-10-CM | POA: Diagnosis not present

## 2018-03-04 MED ORDER — TAMSULOSIN HCL 0.4 MG PO CAPS: 0.4000 mg | ORAL_CAPSULE | Freq: Every day | ORAL | 3 refills | Status: DC

## 2018-03-04 MED ORDER — CARVEDILOL 12.5 MG PO TABS: 12.5000 mg | ORAL_TABLET | Freq: Two times a day (BID) | ORAL | 1 refills | Status: DC

## 2018-03-04 MED ORDER — SPIRONOLACTONE 25 MG PO TABS: 25.0000 mg | ORAL_TABLET | Freq: Every day | ORAL | 1 refills | Status: DC

## 2018-03-04 MED ORDER — DICLOFENAC SODIUM 1 % TD GEL: 4.0000 g | Freq: Four times a day (QID) | TRANSDERMAL | 5 refills | Status: DC | PRN

## 2018-03-04 MED ORDER — SIMVASTATIN 20 MG PO TABS: 20.0000 mg | ORAL_TABLET | Freq: Every day | ORAL | 3 refills | Status: DC

## 2018-03-04 NOTE — Assessment & Plan Note (Signed)
Needs rehabilitation and cannot go safely to PT at this time. Home health ordered. Once this is complete we should attempt to transition to cardiopulmonary rehab to advance his gains.

## 2018-03-04 NOTE — Assessment & Plan Note (Signed)
Hematoma present on exam. Talked to them about this is more likely since on the blood thinner and could take months to resolve. No indication for imaging at this time.

## 2018-03-04 NOTE — Progress Notes (Signed)
   Subjective:    Patient ID: Louis Ford., male    DOB: 08-28-1946, 72 y.o.   MRN: [id number]  HPI The patient is a 72 YO man coming in for mass on his left leg posterior. He noticed this about 1-2 weeks ago. Denies known injury although he does something bump into things. He does take a blood thinner savaysa and this causes him to bruise easily. He denies LOC or fall recently. No recent seizures (has known seizure disorder on dilantin). Denies chest pains or SOB. He is relatively deconditioned and is worried about falling. He did some PT after leaving the hospital at home but feels like he needs more. He does have CHF and this limits his activities significantly. No swelling or weight change recently. Is taking all his medications as prescribed. Per caregiver stays in bed about 80% of the time.   Review of Systems  Constitutional: Positive for activity change and fatigue. Negative for appetite change, chills, fever and unexpected weight change.  HENT: Negative.   Eyes: Negative.   Respiratory: Positive for shortness of breath. Negative for cough and chest tightness.   Cardiovascular: Negative for chest pain, palpitations and leg swelling.  Gastrointestinal: Negative for abdominal distention, abdominal pain, constipation, diarrhea, nausea and vomiting.  Musculoskeletal: Positive for arthralgias, gait problem and myalgias.  Skin: Positive for color change.  Neurological: Positive for weakness. Negative for dizziness, seizures, facial asymmetry, speech difficulty and headaches.  Psychiatric/Behavioral: Negative.       Objective:   Physical Exam  Constitutional: He is oriented to person, place, and time. He appears well-developed and well-nourished.  HENT:  Head: Normocephalic and atraumatic.  Eyes: EOM are normal.  Neck: Normal range of motion.  Cardiovascular: Normal rate and regular rhythm.  Pulmonary/Chest: Effort normal and breath sounds normal. No respiratory distress. He has no  wheezes. He has no rales.  Abdominal: Soft. Bowel sounds are normal. He exhibits no distension. There is no tenderness. There is no rebound.  Musculoskeletal: He exhibits no edema.  Neurological: He is alert and oriented to person, place, and time. Coordination abnormal.  Walker during visit  Skin: Skin is warm and dry.  Large hematoma posterior left leg above the knee, about 5 cm circular region tender to touch   Vitals:   03/04/18 1359  BP: 120/64  Pulse: 76  Temp: 97.9 F (36.6 C)  TempSrc: Oral  SpO2: 97%  Weight: 201 lb (91.2 kg)  Height: 5\' 11"  (1.803 m)      Assessment & Plan:

## 2018-03-04 NOTE — Patient Instructions (Signed)
We have sent in the refills. You can use ice on the bruise. We have also sent in a cream that you can use up to 3 times per day on the area if it hurts.   This may take 1-2 months to go away. We will have physical therapy come out to the house to help with strength.

## 2018-03-05 ENCOUNTER — Telehealth: Payer: Self-pay | Admitting: Internal Medicine

## 2018-03-05 DIAGNOSIS — I5022 Chronic systolic (congestive) heart failure: Secondary | ICD-10-CM | POA: Diagnosis not present

## 2018-03-05 DIAGNOSIS — I255 Ischemic cardiomyopathy: Secondary | ICD-10-CM | POA: Diagnosis not present

## 2018-03-05 DIAGNOSIS — I482 Chronic atrial fibrillation: Secondary | ICD-10-CM | POA: Diagnosis not present

## 2018-03-05 DIAGNOSIS — N400 Enlarged prostate without lower urinary tract symptoms: Secondary | ICD-10-CM | POA: Diagnosis not present

## 2018-03-05 DIAGNOSIS — M6281 Muscle weakness (generalized): Secondary | ICD-10-CM | POA: Diagnosis not present

## 2018-03-05 DIAGNOSIS — Z9181 History of falling: Secondary | ICD-10-CM | POA: Diagnosis not present

## 2018-03-05 DIAGNOSIS — I110 Hypertensive heart disease with heart failure: Secondary | ICD-10-CM | POA: Diagnosis not present

## 2018-03-05 DIAGNOSIS — Z8673 Personal history of transient ischemic attack (TIA), and cerebral infarction without residual deficits: Secondary | ICD-10-CM | POA: Diagnosis not present

## 2018-03-05 NOTE — Telephone Encounter (Signed)
Copied from [place] [id number]. Topic: Inquiry >> Mar 05, 2018  8:53 AM Scherrie Gerlach wrote: Reason for CRM: Anda Kraft with [facility] calling to request the new orders for home health PT be sent to her attention at Fax:  [phone] Anda Kraft has been seeing the pt and this is an extension of his current home health PT.  Anda Kraft states she spoke with the pt yesterday and they advised the dr approved the extension. Pt did see Dr Sharlet Salina yesterday and there are new referral orders in.

## 2018-03-11 ENCOUNTER — Telehealth: Payer: Self-pay | Admitting: Internal Medicine

## 2018-03-11 NOTE — Telephone Encounter (Signed)
Fine

## 2018-03-11 NOTE — Telephone Encounter (Signed)
Copied from [place]. Topic: Quick Communication - See Telephone Encounter >> Mar 11, 2018 12:46 PM [place], Laban Emperor wrote: CRM for notification. See Telephone encounter for: Renee with [facility] calling to request verbal orders for: PT for 1 week 1 and  2 week 3.  Contact [phone]  03/11/18.

## 2018-03-12 NOTE — Telephone Encounter (Signed)
Notified Renee w/MD response.Marland KitchenJohny Chess

## 2018-03-13 DIAGNOSIS — Z8673 Personal history of transient ischemic attack (TIA), and cerebral infarction without residual deficits: Secondary | ICD-10-CM | POA: Diagnosis not present

## 2018-03-13 DIAGNOSIS — Z9181 History of falling: Secondary | ICD-10-CM | POA: Diagnosis not present

## 2018-03-13 DIAGNOSIS — M6281 Muscle weakness (generalized): Secondary | ICD-10-CM | POA: Diagnosis not present

## 2018-03-13 DIAGNOSIS — I110 Hypertensive heart disease with heart failure: Secondary | ICD-10-CM | POA: Diagnosis not present

## 2018-03-13 DIAGNOSIS — I255 Ischemic cardiomyopathy: Secondary | ICD-10-CM | POA: Diagnosis not present

## 2018-03-13 DIAGNOSIS — I5022 Chronic systolic (congestive) heart failure: Secondary | ICD-10-CM | POA: Diagnosis not present

## 2018-03-13 DIAGNOSIS — I482 Chronic atrial fibrillation: Secondary | ICD-10-CM | POA: Diagnosis not present

## 2018-03-13 DIAGNOSIS — N400 Enlarged prostate without lower urinary tract symptoms: Secondary | ICD-10-CM | POA: Diagnosis not present

## 2018-03-14 DIAGNOSIS — Z9181 History of falling: Secondary | ICD-10-CM | POA: Diagnosis not present

## 2018-03-14 DIAGNOSIS — N400 Enlarged prostate without lower urinary tract symptoms: Secondary | ICD-10-CM | POA: Diagnosis not present

## 2018-03-14 DIAGNOSIS — I255 Ischemic cardiomyopathy: Secondary | ICD-10-CM | POA: Diagnosis not present

## 2018-03-14 DIAGNOSIS — I110 Hypertensive heart disease with heart failure: Secondary | ICD-10-CM | POA: Diagnosis not present

## 2018-03-14 DIAGNOSIS — M6281 Muscle weakness (generalized): Secondary | ICD-10-CM | POA: Diagnosis not present

## 2018-03-14 DIAGNOSIS — Z8673 Personal history of transient ischemic attack (TIA), and cerebral infarction without residual deficits: Secondary | ICD-10-CM | POA: Diagnosis not present

## 2018-03-14 DIAGNOSIS — I482 Chronic atrial fibrillation: Secondary | ICD-10-CM | POA: Diagnosis not present

## 2018-03-14 DIAGNOSIS — I5022 Chronic systolic (congestive) heart failure: Secondary | ICD-10-CM | POA: Diagnosis not present

## 2018-03-18 DIAGNOSIS — E785 Hyperlipidemia, unspecified: Secondary | ICD-10-CM | POA: Diagnosis not present

## 2018-03-18 DIAGNOSIS — Z87440 Personal history of urinary (tract) infections: Secondary | ICD-10-CM | POA: Diagnosis not present

## 2018-03-18 DIAGNOSIS — I130 Hypertensive heart and chronic kidney disease with heart failure and stage 1 through stage 4 chronic kidney disease, or unspecified chronic kidney disease: Secondary | ICD-10-CM | POA: Diagnosis not present

## 2018-03-18 DIAGNOSIS — I5022 Chronic systolic (congestive) heart failure: Secondary | ICD-10-CM | POA: Diagnosis not present

## 2018-03-18 DIAGNOSIS — N183 Chronic kidney disease, stage 3 (moderate): Secondary | ICD-10-CM | POA: Diagnosis not present

## 2018-03-18 DIAGNOSIS — M1A9XX0 Chronic gout, unspecified, without tophus (tophi): Secondary | ICD-10-CM | POA: Diagnosis not present

## 2018-03-18 DIAGNOSIS — M6281 Muscle weakness (generalized): Secondary | ICD-10-CM | POA: Diagnosis not present

## 2018-03-18 DIAGNOSIS — N400 Enlarged prostate without lower urinary tract symptoms: Secondary | ICD-10-CM | POA: Diagnosis not present

## 2018-03-18 DIAGNOSIS — I255 Ischemic cardiomyopathy: Secondary | ICD-10-CM | POA: Diagnosis not present

## 2018-03-18 DIAGNOSIS — Z8673 Personal history of transient ischemic attack (TIA), and cerebral infarction without residual deficits: Secondary | ICD-10-CM | POA: Diagnosis not present

## 2018-03-18 DIAGNOSIS — I2510 Atherosclerotic heart disease of native coronary artery without angina pectoris: Secondary | ICD-10-CM | POA: Diagnosis not present

## 2018-03-18 DIAGNOSIS — I482 Chronic atrial fibrillation: Secondary | ICD-10-CM | POA: Diagnosis not present

## 2018-03-20 DIAGNOSIS — I255 Ischemic cardiomyopathy: Secondary | ICD-10-CM | POA: Diagnosis not present

## 2018-03-20 DIAGNOSIS — M6281 Muscle weakness (generalized): Secondary | ICD-10-CM | POA: Diagnosis not present

## 2018-03-20 DIAGNOSIS — I2510 Atherosclerotic heart disease of native coronary artery without angina pectoris: Secondary | ICD-10-CM | POA: Diagnosis not present

## 2018-03-20 DIAGNOSIS — Z87440 Personal history of urinary (tract) infections: Secondary | ICD-10-CM | POA: Diagnosis not present

## 2018-03-20 DIAGNOSIS — N400 Enlarged prostate without lower urinary tract symptoms: Secondary | ICD-10-CM | POA: Diagnosis not present

## 2018-03-20 DIAGNOSIS — M1A9XX0 Chronic gout, unspecified, without tophus (tophi): Secondary | ICD-10-CM | POA: Diagnosis not present

## 2018-03-20 DIAGNOSIS — I5022 Chronic systolic (congestive) heart failure: Secondary | ICD-10-CM | POA: Diagnosis not present

## 2018-03-20 DIAGNOSIS — Z8673 Personal history of transient ischemic attack (TIA), and cerebral infarction without residual deficits: Secondary | ICD-10-CM | POA: Diagnosis not present

## 2018-03-20 DIAGNOSIS — E785 Hyperlipidemia, unspecified: Secondary | ICD-10-CM | POA: Diagnosis not present

## 2018-03-20 DIAGNOSIS — N183 Chronic kidney disease, stage 3 (moderate): Secondary | ICD-10-CM | POA: Diagnosis not present

## 2018-03-20 DIAGNOSIS — I482 Chronic atrial fibrillation: Secondary | ICD-10-CM | POA: Diagnosis not present

## 2018-03-20 DIAGNOSIS — I130 Hypertensive heart and chronic kidney disease with heart failure and stage 1 through stage 4 chronic kidney disease, or unspecified chronic kidney disease: Secondary | ICD-10-CM | POA: Diagnosis not present

## 2018-03-25 ENCOUNTER — Encounter: Payer: Self-pay | Admitting: Family Medicine

## 2018-03-25 ENCOUNTER — Ambulatory Visit (INDEPENDENT_AMBULATORY_CARE_PROVIDER_SITE_OTHER): Payer: Medicare Other | Admitting: Family Medicine

## 2018-03-25 VITALS — BP 132/66 | HR 68 | Temp 98.5°F | Ht 71.0 in | Wt 213.0 lb

## 2018-03-25 DIAGNOSIS — R2243 Localized swelling, mass and lump, lower limb, bilateral: Secondary | ICD-10-CM

## 2018-03-25 DIAGNOSIS — M1712 Unilateral primary osteoarthritis, left knee: Secondary | ICD-10-CM | POA: Diagnosis not present

## 2018-03-25 DIAGNOSIS — N400 Enlarged prostate without lower urinary tract symptoms: Secondary | ICD-10-CM | POA: Diagnosis not present

## 2018-03-25 DIAGNOSIS — G8929 Other chronic pain: Secondary | ICD-10-CM

## 2018-03-25 DIAGNOSIS — I5022 Chronic systolic (congestive) heart failure: Secondary | ICD-10-CM | POA: Diagnosis not present

## 2018-03-25 DIAGNOSIS — I255 Ischemic cardiomyopathy: Secondary | ICD-10-CM | POA: Diagnosis not present

## 2018-03-25 DIAGNOSIS — I2510 Atherosclerotic heart disease of native coronary artery without angina pectoris: Secondary | ICD-10-CM | POA: Diagnosis not present

## 2018-03-25 DIAGNOSIS — M25562 Pain in left knee: Secondary | ICD-10-CM | POA: Diagnosis not present

## 2018-03-25 DIAGNOSIS — I482 Chronic atrial fibrillation: Secondary | ICD-10-CM | POA: Diagnosis not present

## 2018-03-25 DIAGNOSIS — N183 Chronic kidney disease, stage 3 (moderate): Secondary | ICD-10-CM | POA: Diagnosis not present

## 2018-03-25 DIAGNOSIS — M6281 Muscle weakness (generalized): Secondary | ICD-10-CM | POA: Diagnosis not present

## 2018-03-25 DIAGNOSIS — Z87440 Personal history of urinary (tract) infections: Secondary | ICD-10-CM | POA: Diagnosis not present

## 2018-03-25 DIAGNOSIS — T148XXA Other injury of unspecified body region, initial encounter: Secondary | ICD-10-CM | POA: Diagnosis not present

## 2018-03-25 DIAGNOSIS — Z8673 Personal history of transient ischemic attack (TIA), and cerebral infarction without residual deficits: Secondary | ICD-10-CM | POA: Diagnosis not present

## 2018-03-25 DIAGNOSIS — M1A9XX0 Chronic gout, unspecified, without tophus (tophi): Secondary | ICD-10-CM | POA: Diagnosis not present

## 2018-03-25 DIAGNOSIS — I130 Hypertensive heart and chronic kidney disease with heart failure and stage 1 through stage 4 chronic kidney disease, or unspecified chronic kidney disease: Secondary | ICD-10-CM | POA: Diagnosis not present

## 2018-03-25 DIAGNOSIS — E785 Hyperlipidemia, unspecified: Secondary | ICD-10-CM | POA: Diagnosis not present

## 2018-03-25 NOTE — Progress Notes (Signed)
[facility]. - 72 y.o. male MRN [id number]  Date of birth: May 06, 1946  SUBJECTIVE:  Including CC & ROS.  Chief Complaint  Patient presents with  . Left knee swelling    [facility] is a 72 y.o. male that is presenting left knee swelling. Ongoing for three days. He noticed after the swelling after he got home from church. Pain is located diffusely throughout his knee. He has been applying ice. Flexion and extension trigger mild to severe pain. Denies injury or surgeries. He uses a cane to ambulate daily, today he used a walker. Has limited range of motion. Has received injections into his knee last fall. These helped with his pain.   He also has a knot on the lateral/posterior aspect of his left and right thigh. These areas are painful when he sits on them. They have gotten smaller. They appeared after he fell.    Independent review of the left knee xray from 1/19 shows significant degenerative changes.    Review of Systems  Constitutional: Negative for fever.  HENT: Negative for congestion.   Respiratory: Negative for cough.   Cardiovascular: Negative for leg swelling.  Gastrointestinal: Negative for abdominal pain.  Musculoskeletal: Positive for gait problem and joint swelling.  Skin: Negative for color change.  Neurological: Positive for weakness.  Hematological: Negative for adenopathy.    HISTORY: Past Medical, Surgical, Social, and Family History Reviewed & Updated per EMR.   Pertinent Historical Findings include:  Past Medical History:  Diagnosis Date  . AICD (automatic cardioverter/defibrillator) present   . Atrial fibrillation   09/22/2012  . Blood loss anemia 04/18/2017   After GI bleed from colonoscopy and polypectomy  . CAD (coronary artery disease)   . Chronic systolic heart failure ([place])   . 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 GIB (gastrointestinal bleeding) 02/20/2017  . History of MRSA infection 05/2011  .  HTN (hypertension)   . Hx of adenomatous colonic polyps 02/20/2017   01/2017 - 3 cm sigmoid TV adenoma and other smaller polyps - had post-polypectomy bleed Tx w/ clips Consider repeat colonoscopy 3 yrs [name], MD, [name]   . Hyperlipidemia   . implantable cardiac defibrillator-Biotronik    Device Implanted 2006; s/p gen change 03/2011 : bleeding persistent with pocket erosion and infection; explant and reimplant  06/2011  . Ischemic cardiomyopathy    EF 15 to 20% by TTE and TEE in 09/2012.  Severe LV dysfunction  . Obesity    BMI 31 in 09/2012  . Persistent atrial fibrillation with rapid ventricular response ([place]) 04/21/2014  . Seizure disorder ([place]) latest 09/30/2012  . Seizures ([place])   . Stroke [facility])     Past Surgical History:  Procedure Laterality Date  . CARDIAC CATHETERIZATION N/A 10/12/2015   Procedure: Right Heart Cath;  Surgeon: [name], MD;  Location: [facility] CV LAB;  Service: Cardiovascular;  Laterality: N/A;  . CARDIOVERSION  09/24/2012   Procedure: CARDIOVERSION;  Surgeon: [name], MD;  Location: [facility] ENDOSCOPY;  Service: Cardiovascular;  Laterality: N/A;  . CARDIOVERSION N/A 12/25/2017   Procedure: CARDIOVERSION;  Surgeon: [name], MD;  Location: [facility] ORS;  Service: Cardiovascular;  Laterality: N/A;  . COLONOSCOPY W/ POLYPECTOMY  02/13/2017  . CORONARY ARTERY BYPASS GRAFT  2011   in [place]  . FLEXIBLE SIGMOIDOSCOPY N/A 02/20/2017   Procedure: FLEXIBLE SIGMOIDOSCOPY;  Surgeon: [name], MD;  Location: [facility] ENDOSCOPY;  Service: Endoscopy;  Laterality: N/A;  . FLEXIBLE SIGMOIDOSCOPY N/A 02/21/2017   Procedure: FLEXIBLE SIGMOIDOSCOPY;  Surgeon: [name], MD;  Location[facility] ENDOSCOPY;  Service: Endoscopy;  Laterality: N/A;  . ICD    . TEE WITHOUT CARDIOVERSION  09/24/2012   Procedure: TRANSESOPHAGEAL ECHOCARDIOGRAM (TEE);  Surgeon: [name], MD;  Location: [facility] ENDOSCOPY;  Service: Cardiovascular;  Laterality: N/A;  dave/anesth, dl, cindy/echo       No Known Allergies  Family History  Problem Relation Age of Onset  . Arthritis Mother   . Heart disease Mother   . Heart attack Mother   . Other Father        smoker  . Hypertension Neg Hx        unknown  . Stroke Neg Hx        unknown     Social History   Socioeconomic History  . Marital status: Married    Spouse name: Not on file  . Number of children: Not on file  . Years of education: Not on file  . Highest education level: Not on file  Occupational History  . Not on file  Social Needs  . Financial resource strain: Not on file  . Food insecurity:    Worry: Not on file    Inability: Not on file  . Transportation needs:    Medical: Not on file    Non-medical: Not on file  Tobacco Use  . Smoking status: Former Smoker    Types: Pipe    Last attempt to quit: 09/21/2008    Years since quitting: 9.5  . Smokeless tobacco: Former Systems developer    Quit date: 09/21/2008  Substance and Sexual Activity  . Alcohol use: No  . Drug use: No  . Sexual activity: Never    Birth control/protection: None  Lifestyle  . Physical activity:    Days per week: Not on file    Minutes per session: Not on file  . Stress: Not on file  Relationships  . Social connections:    Talks on phone: Not on file    Gets together: Not on file    Attends religious service: Not on file    Active member of club or organization: Not on file    Attends meetings of clubs or organizations: Not on file    Relationship status: Not on file  . Intimate partner violence:    Fear of current or ex partner: Not on file    Emotionally abused: Not on file    Physically abused: Not on file    Forced sexual activity: Not on file  Other Topics Concern  . Not on file  Social History Narrative   ** Merged History Encounter **         PHYSICAL EXAM:  VS: BP 132/66 (BP Location: Left Arm, Patient Position: Sitting, Cuff Size: Normal)   Pulse 68   Temp 98.5 F (36.9 C) (Oral)   Ht 5\' 11"  (1.803 m)   Wt 213  lb (96.6 kg)   BMI 29.71 kg/m  Physical Exam Gen: NAD, alert, cooperative with exam,  ENT: normal lips, normal nasal mucosa,  Eye: normal EOM, normal conjunctiva and lids CV:  no edema, +2 pedal pulses   Resp: no accessory muscle use, non-labored,  Skin: no rashes, no areas of induration  Neuro: normal tone, normal sensation to touch Psych:  normal insight, alert and oriented MSK:  Left knee:  Obvious effusion  Limited flexion and extension  TTP of the medial  joint line  Mild instability with valgus  Normal strength to resistance with flexion and extension  Walking with rolling walker  Right thigh:  Firm mass on the latera aspect of posterior thigh  Left thigh:  Firm mass on the lateral aspect of posterior thigh.  Neurovascularly intact.   Limited ultrasound: right thigh/left thigh/left knee:  Right thigh:  Small hypoechoic change in subcutaneous tissue that represents a hematoma   Left thigh:  Small hypoechoic change in subcutaneous tissue that represents a hematoma  Left knee:  Large effusion  Normal QT and PT   Summary: Hematoma in left and right thigh. Severe knee effusion   Ultrasound and interpretation by Clearance Coots, MD      Aspiration/Injection Procedure Note [facility] [birth year]  Procedure: Aspiration Indications: left knee pain and swelling  Procedure Details Consent: Risks of procedure as well as the alternatives and risks of each were explained to the (patient/caregiver).  Consent for procedure obtained. Time Out: Verified patient identification, verified procedure, site/side was marked, verified correct patient position, special equipment/implants available, medications/allergies/relevent history reviewed, required imaging and test results available.  Performed.  The area was cleaned with iodine and alcohol swabs.    The left knee SPP superiorlateral aspect was injected using 4 cc's of 1% lidocaine with a 25 1 1/2" needle. An 18" needle  was then inserted and aspiration occurred.  Ultrasound was used.  Amount of Fluid Aspirated: 23mL Character of Fluid: bloody Fluid was sent for:n/a A sterile dressing was applied.  Patient did tolerate procedure well.     ASSESSMENT & PLAN:   Chronic pain of left knee Has a hemarthrosis and unable to aspirate all of the effusion. Possible to be related to this anticoagulation  - aspiration but unable to draw all fluid out  - compression and heat  - continue to use rolling walker  - f/u in one month to monitor for calcium deposition   Hematoma These appear on bother legs. Possible to be related from his fall.  - compression, heat  - will continue to monitor.

## 2018-03-25 NOTE — Patient Instructions (Signed)
Please try to keep your knee wrapped  Please try to heat the knee  Please follow up with me in 4 weeks if there is no improvement.

## 2018-03-26 ENCOUNTER — Telehealth: Payer: Self-pay | Admitting: Family Medicine

## 2018-03-26 DIAGNOSIS — N183 Chronic kidney disease, stage 3 (moderate): Secondary | ICD-10-CM | POA: Diagnosis not present

## 2018-03-26 DIAGNOSIS — M6281 Muscle weakness (generalized): Secondary | ICD-10-CM | POA: Diagnosis not present

## 2018-03-26 DIAGNOSIS — I255 Ischemic cardiomyopathy: Secondary | ICD-10-CM | POA: Diagnosis not present

## 2018-03-26 DIAGNOSIS — Z8673 Personal history of transient ischemic attack (TIA), and cerebral infarction without residual deficits: Secondary | ICD-10-CM | POA: Diagnosis not present

## 2018-03-26 DIAGNOSIS — N400 Enlarged prostate without lower urinary tract symptoms: Secondary | ICD-10-CM | POA: Diagnosis not present

## 2018-03-26 DIAGNOSIS — I2510 Atherosclerotic heart disease of native coronary artery without angina pectoris: Secondary | ICD-10-CM | POA: Diagnosis not present

## 2018-03-26 DIAGNOSIS — I130 Hypertensive heart and chronic kidney disease with heart failure and stage 1 through stage 4 chronic kidney disease, or unspecified chronic kidney disease: Secondary | ICD-10-CM | POA: Diagnosis not present

## 2018-03-26 DIAGNOSIS — E785 Hyperlipidemia, unspecified: Secondary | ICD-10-CM | POA: Diagnosis not present

## 2018-03-26 DIAGNOSIS — T148XXA Other injury of unspecified body region, initial encounter: Secondary | ICD-10-CM

## 2018-03-26 DIAGNOSIS — I482 Chronic atrial fibrillation: Secondary | ICD-10-CM | POA: Diagnosis not present

## 2018-03-26 DIAGNOSIS — S20219A Contusion of unspecified front wall of thorax, initial encounter: Secondary | ICD-10-CM | POA: Insufficient documentation

## 2018-03-26 DIAGNOSIS — Z87440 Personal history of urinary (tract) infections: Secondary | ICD-10-CM | POA: Diagnosis not present

## 2018-03-26 DIAGNOSIS — M1A9XX0 Chronic gout, unspecified, without tophus (tophi): Secondary | ICD-10-CM | POA: Diagnosis not present

## 2018-03-26 DIAGNOSIS — I5022 Chronic systolic (congestive) heart failure: Secondary | ICD-10-CM | POA: Diagnosis not present

## 2018-03-26 NOTE — Telephone Encounter (Signed)
Copied from [place] [id number]. Topic: Quick Communication - See Telephone Encounter >> Mar 26, 2018  2:09 PM Robina Ade, Leone Payor wrote: Leontine Locket with [facility] called and would like to talk to Dr. Raeford Razor or his CMA about patient yesterday visit. She can be reached at [phone]. CRM for notification. See Telephone encounter for: 03/26/18.

## 2018-03-26 NOTE — Assessment & Plan Note (Signed)
Has a hemarthrosis and unable to aspirate all of the effusion. Possible to be related to this anticoagulation  - aspiration but unable to draw all fluid out  - compression and heat  - continue to use rolling walker  - f/u in one month to monitor for calcium deposition

## 2018-03-26 NOTE — Assessment & Plan Note (Signed)
These appear on bother legs. Possible to be related from his fall.  - compression, heat  - will continue to monitor.

## 2018-03-26 NOTE — Telephone Encounter (Signed)
Spoke with [place] of Dr. Doristine Locks instructions from 03/25/18 visit.

## 2018-03-31 DIAGNOSIS — I482 Chronic atrial fibrillation: Secondary | ICD-10-CM | POA: Diagnosis not present

## 2018-03-31 DIAGNOSIS — M1A9XX0 Chronic gout, unspecified, without tophus (tophi): Secondary | ICD-10-CM | POA: Diagnosis not present

## 2018-03-31 DIAGNOSIS — Z8673 Personal history of transient ischemic attack (TIA), and cerebral infarction without residual deficits: Secondary | ICD-10-CM | POA: Diagnosis not present

## 2018-03-31 DIAGNOSIS — N400 Enlarged prostate without lower urinary tract symptoms: Secondary | ICD-10-CM | POA: Diagnosis not present

## 2018-03-31 DIAGNOSIS — Z87440 Personal history of urinary (tract) infections: Secondary | ICD-10-CM | POA: Diagnosis not present

## 2018-03-31 DIAGNOSIS — I130 Hypertensive heart and chronic kidney disease with heart failure and stage 1 through stage 4 chronic kidney disease, or unspecified chronic kidney disease: Secondary | ICD-10-CM | POA: Diagnosis not present

## 2018-03-31 DIAGNOSIS — N183 Chronic kidney disease, stage 3 (moderate): Secondary | ICD-10-CM | POA: Diagnosis not present

## 2018-03-31 DIAGNOSIS — M6281 Muscle weakness (generalized): Secondary | ICD-10-CM | POA: Diagnosis not present

## 2018-03-31 DIAGNOSIS — I2510 Atherosclerotic heart disease of native coronary artery without angina pectoris: Secondary | ICD-10-CM | POA: Diagnosis not present

## 2018-03-31 DIAGNOSIS — I5022 Chronic systolic (congestive) heart failure: Secondary | ICD-10-CM | POA: Diagnosis not present

## 2018-03-31 DIAGNOSIS — E785 Hyperlipidemia, unspecified: Secondary | ICD-10-CM | POA: Diagnosis not present

## 2018-03-31 DIAGNOSIS — I255 Ischemic cardiomyopathy: Secondary | ICD-10-CM | POA: Diagnosis not present

## 2018-04-03 ENCOUNTER — Telehealth: Payer: Self-pay | Admitting: Internal Medicine

## 2018-04-03 DIAGNOSIS — I130 Hypertensive heart and chronic kidney disease with heart failure and stage 1 through stage 4 chronic kidney disease, or unspecified chronic kidney disease: Secondary | ICD-10-CM | POA: Diagnosis not present

## 2018-04-03 DIAGNOSIS — I482 Chronic atrial fibrillation: Secondary | ICD-10-CM | POA: Diagnosis not present

## 2018-04-03 DIAGNOSIS — M1A9XX0 Chronic gout, unspecified, without tophus (tophi): Secondary | ICD-10-CM | POA: Diagnosis not present

## 2018-04-03 DIAGNOSIS — E785 Hyperlipidemia, unspecified: Secondary | ICD-10-CM | POA: Diagnosis not present

## 2018-04-03 DIAGNOSIS — N400 Enlarged prostate without lower urinary tract symptoms: Secondary | ICD-10-CM | POA: Diagnosis not present

## 2018-04-03 DIAGNOSIS — M6281 Muscle weakness (generalized): Secondary | ICD-10-CM | POA: Diagnosis not present

## 2018-04-03 DIAGNOSIS — I2510 Atherosclerotic heart disease of native coronary artery without angina pectoris: Secondary | ICD-10-CM | POA: Diagnosis not present

## 2018-04-03 DIAGNOSIS — N183 Chronic kidney disease, stage 3 (moderate): Secondary | ICD-10-CM | POA: Diagnosis not present

## 2018-04-03 DIAGNOSIS — Z87440 Personal history of urinary (tract) infections: Secondary | ICD-10-CM | POA: Diagnosis not present

## 2018-04-03 DIAGNOSIS — I5022 Chronic systolic (congestive) heart failure: Secondary | ICD-10-CM | POA: Diagnosis not present

## 2018-04-03 DIAGNOSIS — Z8673 Personal history of transient ischemic attack (TIA), and cerebral infarction without residual deficits: Secondary | ICD-10-CM | POA: Diagnosis not present

## 2018-04-03 DIAGNOSIS — I255 Ischemic cardiomyopathy: Secondary | ICD-10-CM | POA: Diagnosis not present

## 2018-04-03 NOTE — Telephone Encounter (Signed)
Fine

## 2018-04-03 NOTE — Telephone Encounter (Signed)
Notified Louis Ford/MD response.Marland KitchenJohny Chess

## 2018-04-03 NOTE — Telephone Encounter (Signed)
Copied from [place] [phone]. Topic: Inquiry >> Apr 03, 2018  1:22 PM Oliver Pila B wrote: Reason for CRM: [facility] called to get verbals to extend treatment, call [facility] @ [phone]

## 2018-04-08 DIAGNOSIS — M6281 Muscle weakness (generalized): Secondary | ICD-10-CM | POA: Diagnosis not present

## 2018-04-08 DIAGNOSIS — I482 Chronic atrial fibrillation: Secondary | ICD-10-CM | POA: Diagnosis not present

## 2018-04-08 DIAGNOSIS — I130 Hypertensive heart and chronic kidney disease with heart failure and stage 1 through stage 4 chronic kidney disease, or unspecified chronic kidney disease: Secondary | ICD-10-CM | POA: Diagnosis not present

## 2018-04-08 DIAGNOSIS — I5022 Chronic systolic (congestive) heart failure: Secondary | ICD-10-CM | POA: Diagnosis not present

## 2018-04-08 DIAGNOSIS — Z87440 Personal history of urinary (tract) infections: Secondary | ICD-10-CM | POA: Diagnosis not present

## 2018-04-08 DIAGNOSIS — I2510 Atherosclerotic heart disease of native coronary artery without angina pectoris: Secondary | ICD-10-CM | POA: Diagnosis not present

## 2018-04-08 DIAGNOSIS — Z8673 Personal history of transient ischemic attack (TIA), and cerebral infarction without residual deficits: Secondary | ICD-10-CM | POA: Diagnosis not present

## 2018-04-08 DIAGNOSIS — I255 Ischemic cardiomyopathy: Secondary | ICD-10-CM | POA: Diagnosis not present

## 2018-04-08 DIAGNOSIS — N183 Chronic kidney disease, stage 3 (moderate): Secondary | ICD-10-CM | POA: Diagnosis not present

## 2018-04-08 DIAGNOSIS — N400 Enlarged prostate without lower urinary tract symptoms: Secondary | ICD-10-CM | POA: Diagnosis not present

## 2018-04-08 DIAGNOSIS — E785 Hyperlipidemia, unspecified: Secondary | ICD-10-CM | POA: Diagnosis not present

## 2018-04-08 DIAGNOSIS — M1A9XX0 Chronic gout, unspecified, without tophus (tophi): Secondary | ICD-10-CM | POA: Diagnosis not present

## 2018-04-10 DIAGNOSIS — I255 Ischemic cardiomyopathy: Secondary | ICD-10-CM | POA: Diagnosis not present

## 2018-04-10 DIAGNOSIS — Z8673 Personal history of transient ischemic attack (TIA), and cerebral infarction without residual deficits: Secondary | ICD-10-CM | POA: Diagnosis not present

## 2018-04-10 DIAGNOSIS — E785 Hyperlipidemia, unspecified: Secondary | ICD-10-CM | POA: Diagnosis not present

## 2018-04-10 DIAGNOSIS — I2510 Atherosclerotic heart disease of native coronary artery without angina pectoris: Secondary | ICD-10-CM | POA: Diagnosis not present

## 2018-04-10 DIAGNOSIS — I130 Hypertensive heart and chronic kidney disease with heart failure and stage 1 through stage 4 chronic kidney disease, or unspecified chronic kidney disease: Secondary | ICD-10-CM | POA: Diagnosis not present

## 2018-04-10 DIAGNOSIS — N400 Enlarged prostate without lower urinary tract symptoms: Secondary | ICD-10-CM | POA: Diagnosis not present

## 2018-04-10 DIAGNOSIS — I482 Chronic atrial fibrillation: Secondary | ICD-10-CM | POA: Diagnosis not present

## 2018-04-10 DIAGNOSIS — Z87440 Personal history of urinary (tract) infections: Secondary | ICD-10-CM | POA: Diagnosis not present

## 2018-04-10 DIAGNOSIS — N183 Chronic kidney disease, stage 3 (moderate): Secondary | ICD-10-CM | POA: Diagnosis not present

## 2018-04-10 DIAGNOSIS — M6281 Muscle weakness (generalized): Secondary | ICD-10-CM | POA: Diagnosis not present

## 2018-04-10 DIAGNOSIS — M1A9XX0 Chronic gout, unspecified, without tophus (tophi): Secondary | ICD-10-CM | POA: Diagnosis not present

## 2018-04-10 DIAGNOSIS — I5022 Chronic systolic (congestive) heart failure: Secondary | ICD-10-CM | POA: Diagnosis not present

## 2018-04-14 ENCOUNTER — Encounter (HOSPITAL_COMMUNITY): Payer: Medicare Other | Admitting: Internal Medicine

## 2018-04-16 DIAGNOSIS — I255 Ischemic cardiomyopathy: Secondary | ICD-10-CM | POA: Diagnosis not present

## 2018-04-16 DIAGNOSIS — Z87440 Personal history of urinary (tract) infections: Secondary | ICD-10-CM | POA: Diagnosis not present

## 2018-04-16 DIAGNOSIS — M1A9XX0 Chronic gout, unspecified, without tophus (tophi): Secondary | ICD-10-CM | POA: Diagnosis not present

## 2018-04-16 DIAGNOSIS — N400 Enlarged prostate without lower urinary tract symptoms: Secondary | ICD-10-CM | POA: Diagnosis not present

## 2018-04-16 DIAGNOSIS — I130 Hypertensive heart and chronic kidney disease with heart failure and stage 1 through stage 4 chronic kidney disease, or unspecified chronic kidney disease: Secondary | ICD-10-CM | POA: Diagnosis not present

## 2018-04-16 DIAGNOSIS — I2510 Atherosclerotic heart disease of native coronary artery without angina pectoris: Secondary | ICD-10-CM | POA: Diagnosis not present

## 2018-04-16 DIAGNOSIS — I5022 Chronic systolic (congestive) heart failure: Secondary | ICD-10-CM | POA: Diagnosis not present

## 2018-04-16 DIAGNOSIS — E785 Hyperlipidemia, unspecified: Secondary | ICD-10-CM | POA: Diagnosis not present

## 2018-04-16 DIAGNOSIS — M6281 Muscle weakness (generalized): Secondary | ICD-10-CM | POA: Diagnosis not present

## 2018-04-16 DIAGNOSIS — Z8673 Personal history of transient ischemic attack (TIA), and cerebral infarction without residual deficits: Secondary | ICD-10-CM | POA: Diagnosis not present

## 2018-04-16 DIAGNOSIS — I482 Chronic atrial fibrillation: Secondary | ICD-10-CM | POA: Diagnosis not present

## 2018-04-16 DIAGNOSIS — N183 Chronic kidney disease, stage 3 (moderate): Secondary | ICD-10-CM | POA: Diagnosis not present

## 2018-04-17 ENCOUNTER — Telehealth (HOSPITAL_COMMUNITY): Payer: Self-pay | Admitting: Internal Medicine

## 2018-04-17 DIAGNOSIS — Z87440 Personal history of urinary (tract) infections: Secondary | ICD-10-CM | POA: Diagnosis not present

## 2018-04-17 DIAGNOSIS — I255 Ischemic cardiomyopathy: Secondary | ICD-10-CM | POA: Diagnosis not present

## 2018-04-17 DIAGNOSIS — Z8673 Personal history of transient ischemic attack (TIA), and cerebral infarction without residual deficits: Secondary | ICD-10-CM | POA: Diagnosis not present

## 2018-04-17 DIAGNOSIS — M1A9XX0 Chronic gout, unspecified, without tophus (tophi): Secondary | ICD-10-CM | POA: Diagnosis not present

## 2018-04-17 DIAGNOSIS — N183 Chronic kidney disease, stage 3 (moderate): Secondary | ICD-10-CM | POA: Diagnosis not present

## 2018-04-17 DIAGNOSIS — E785 Hyperlipidemia, unspecified: Secondary | ICD-10-CM | POA: Diagnosis not present

## 2018-04-17 DIAGNOSIS — I2510 Atherosclerotic heart disease of native coronary artery without angina pectoris: Secondary | ICD-10-CM | POA: Diagnosis not present

## 2018-04-17 DIAGNOSIS — I5022 Chronic systolic (congestive) heart failure: Secondary | ICD-10-CM | POA: Diagnosis not present

## 2018-04-17 DIAGNOSIS — I482 Chronic atrial fibrillation: Secondary | ICD-10-CM | POA: Diagnosis not present

## 2018-04-17 DIAGNOSIS — N400 Enlarged prostate without lower urinary tract symptoms: Secondary | ICD-10-CM | POA: Diagnosis not present

## 2018-04-17 DIAGNOSIS — M6281 Muscle weakness (generalized): Secondary | ICD-10-CM | POA: Diagnosis not present

## 2018-04-17 DIAGNOSIS — I130 Hypertensive heart and chronic kidney disease with heart failure and stage 1 through stage 4 chronic kidney disease, or unspecified chronic kidney disease: Secondary | ICD-10-CM | POA: Diagnosis not present

## 2018-04-17 NOTE — Telephone Encounter (Signed)
Left message for patient to call back.  Need to schedule f/u with APP side, next available per Kevan Rosebush, RN. Pt missed 04/14/18 appt with Dr. Haroldine Laws.

## 2018-04-21 DIAGNOSIS — N400 Enlarged prostate without lower urinary tract symptoms: Secondary | ICD-10-CM | POA: Diagnosis not present

## 2018-04-21 DIAGNOSIS — I130 Hypertensive heart and chronic kidney disease with heart failure and stage 1 through stage 4 chronic kidney disease, or unspecified chronic kidney disease: Secondary | ICD-10-CM | POA: Diagnosis not present

## 2018-04-21 DIAGNOSIS — E785 Hyperlipidemia, unspecified: Secondary | ICD-10-CM | POA: Diagnosis not present

## 2018-04-21 DIAGNOSIS — I482 Chronic atrial fibrillation: Secondary | ICD-10-CM | POA: Diagnosis not present

## 2018-04-21 DIAGNOSIS — I5022 Chronic systolic (congestive) heart failure: Secondary | ICD-10-CM | POA: Diagnosis not present

## 2018-04-21 DIAGNOSIS — Z87440 Personal history of urinary (tract) infections: Secondary | ICD-10-CM | POA: Diagnosis not present

## 2018-04-21 DIAGNOSIS — N183 Chronic kidney disease, stage 3 (moderate): Secondary | ICD-10-CM | POA: Diagnosis not present

## 2018-04-21 DIAGNOSIS — I2510 Atherosclerotic heart disease of native coronary artery without angina pectoris: Secondary | ICD-10-CM | POA: Diagnosis not present

## 2018-04-21 DIAGNOSIS — M6281 Muscle weakness (generalized): Secondary | ICD-10-CM | POA: Diagnosis not present

## 2018-04-21 DIAGNOSIS — I255 Ischemic cardiomyopathy: Secondary | ICD-10-CM | POA: Diagnosis not present

## 2018-04-21 DIAGNOSIS — Z8673 Personal history of transient ischemic attack (TIA), and cerebral infarction without residual deficits: Secondary | ICD-10-CM | POA: Diagnosis not present

## 2018-04-21 DIAGNOSIS — M1A9XX0 Chronic gout, unspecified, without tophus (tophi): Secondary | ICD-10-CM | POA: Diagnosis not present

## 2018-04-22 DIAGNOSIS — Z87440 Personal history of urinary (tract) infections: Secondary | ICD-10-CM | POA: Diagnosis not present

## 2018-04-22 DIAGNOSIS — I482 Chronic atrial fibrillation: Secondary | ICD-10-CM | POA: Diagnosis not present

## 2018-04-22 DIAGNOSIS — I130 Hypertensive heart and chronic kidney disease with heart failure and stage 1 through stage 4 chronic kidney disease, or unspecified chronic kidney disease: Secondary | ICD-10-CM | POA: Diagnosis not present

## 2018-04-22 DIAGNOSIS — I255 Ischemic cardiomyopathy: Secondary | ICD-10-CM | POA: Diagnosis not present

## 2018-04-22 DIAGNOSIS — E785 Hyperlipidemia, unspecified: Secondary | ICD-10-CM | POA: Diagnosis not present

## 2018-04-22 DIAGNOSIS — M6281 Muscle weakness (generalized): Secondary | ICD-10-CM | POA: Diagnosis not present

## 2018-04-22 DIAGNOSIS — I5022 Chronic systolic (congestive) heart failure: Secondary | ICD-10-CM | POA: Diagnosis not present

## 2018-04-22 DIAGNOSIS — N183 Chronic kidney disease, stage 3 (moderate): Secondary | ICD-10-CM | POA: Diagnosis not present

## 2018-04-22 DIAGNOSIS — Z8673 Personal history of transient ischemic attack (TIA), and cerebral infarction without residual deficits: Secondary | ICD-10-CM | POA: Diagnosis not present

## 2018-04-22 DIAGNOSIS — I2510 Atherosclerotic heart disease of native coronary artery without angina pectoris: Secondary | ICD-10-CM | POA: Diagnosis not present

## 2018-04-22 DIAGNOSIS — N400 Enlarged prostate without lower urinary tract symptoms: Secondary | ICD-10-CM | POA: Diagnosis not present

## 2018-04-22 DIAGNOSIS — M1A9XX0 Chronic gout, unspecified, without tophus (tophi): Secondary | ICD-10-CM | POA: Diagnosis not present

## 2018-04-24 DIAGNOSIS — E785 Hyperlipidemia, unspecified: Secondary | ICD-10-CM | POA: Diagnosis not present

## 2018-04-24 DIAGNOSIS — I255 Ischemic cardiomyopathy: Secondary | ICD-10-CM | POA: Diagnosis not present

## 2018-04-24 DIAGNOSIS — I2510 Atherosclerotic heart disease of native coronary artery without angina pectoris: Secondary | ICD-10-CM | POA: Diagnosis not present

## 2018-04-24 DIAGNOSIS — N400 Enlarged prostate without lower urinary tract symptoms: Secondary | ICD-10-CM | POA: Diagnosis not present

## 2018-04-24 DIAGNOSIS — I130 Hypertensive heart and chronic kidney disease with heart failure and stage 1 through stage 4 chronic kidney disease, or unspecified chronic kidney disease: Secondary | ICD-10-CM | POA: Diagnosis not present

## 2018-04-24 DIAGNOSIS — Z8673 Personal history of transient ischemic attack (TIA), and cerebral infarction without residual deficits: Secondary | ICD-10-CM | POA: Diagnosis not present

## 2018-04-24 DIAGNOSIS — I482 Chronic atrial fibrillation: Secondary | ICD-10-CM | POA: Diagnosis not present

## 2018-04-24 DIAGNOSIS — I5022 Chronic systolic (congestive) heart failure: Secondary | ICD-10-CM | POA: Diagnosis not present

## 2018-04-24 DIAGNOSIS — M1A9XX0 Chronic gout, unspecified, without tophus (tophi): Secondary | ICD-10-CM | POA: Diagnosis not present

## 2018-04-24 DIAGNOSIS — Z87440 Personal history of urinary (tract) infections: Secondary | ICD-10-CM

## 2018-04-24 DIAGNOSIS — N183 Chronic kidney disease, stage 3 (moderate): Secondary | ICD-10-CM | POA: Diagnosis not present

## 2018-04-24 DIAGNOSIS — Z9181 History of falling: Secondary | ICD-10-CM

## 2018-04-24 DIAGNOSIS — G40909 Epilepsy, unspecified, not intractable, without status epilepticus: Secondary | ICD-10-CM | POA: Diagnosis not present

## 2018-04-24 DIAGNOSIS — M6281 Muscle weakness (generalized): Secondary | ICD-10-CM | POA: Diagnosis not present

## 2018-04-28 ENCOUNTER — Other Ambulatory Visit: Payer: Self-pay | Admitting: Internal Medicine

## 2018-04-28 ENCOUNTER — Other Ambulatory Visit (HOSPITAL_COMMUNITY): Payer: Self-pay | Admitting: Internal Medicine

## 2018-04-29 DIAGNOSIS — I5022 Chronic systolic (congestive) heart failure: Secondary | ICD-10-CM | POA: Diagnosis not present

## 2018-04-29 DIAGNOSIS — M6281 Muscle weakness (generalized): Secondary | ICD-10-CM | POA: Diagnosis not present

## 2018-04-29 DIAGNOSIS — I482 Chronic atrial fibrillation: Secondary | ICD-10-CM | POA: Diagnosis not present

## 2018-04-29 DIAGNOSIS — Z8673 Personal history of transient ischemic attack (TIA), and cerebral infarction without residual deficits: Secondary | ICD-10-CM | POA: Diagnosis not present

## 2018-04-29 DIAGNOSIS — I2510 Atherosclerotic heart disease of native coronary artery without angina pectoris: Secondary | ICD-10-CM | POA: Diagnosis not present

## 2018-04-29 DIAGNOSIS — E785 Hyperlipidemia, unspecified: Secondary | ICD-10-CM | POA: Diagnosis not present

## 2018-04-29 DIAGNOSIS — N183 Chronic kidney disease, stage 3 (moderate): Secondary | ICD-10-CM | POA: Diagnosis not present

## 2018-04-29 DIAGNOSIS — I255 Ischemic cardiomyopathy: Secondary | ICD-10-CM | POA: Diagnosis not present

## 2018-04-29 DIAGNOSIS — Z87440 Personal history of urinary (tract) infections: Secondary | ICD-10-CM | POA: Diagnosis not present

## 2018-04-29 DIAGNOSIS — M1A9XX0 Chronic gout, unspecified, without tophus (tophi): Secondary | ICD-10-CM | POA: Diagnosis not present

## 2018-04-29 DIAGNOSIS — I130 Hypertensive heart and chronic kidney disease with heart failure and stage 1 through stage 4 chronic kidney disease, or unspecified chronic kidney disease: Secondary | ICD-10-CM | POA: Diagnosis not present

## 2018-04-29 DIAGNOSIS — N400 Enlarged prostate without lower urinary tract symptoms: Secondary | ICD-10-CM | POA: Diagnosis not present

## 2018-04-30 ENCOUNTER — Ambulatory Visit (INDEPENDENT_AMBULATORY_CARE_PROVIDER_SITE_OTHER): Payer: Medicare Other | Admitting: Psychology

## 2018-04-30 DIAGNOSIS — F4323 Adjustment disorder with mixed anxiety and depressed mood: Secondary | ICD-10-CM

## 2018-05-01 DIAGNOSIS — I255 Ischemic cardiomyopathy: Secondary | ICD-10-CM | POA: Diagnosis not present

## 2018-05-01 DIAGNOSIS — I482 Chronic atrial fibrillation: Secondary | ICD-10-CM | POA: Diagnosis not present

## 2018-05-01 DIAGNOSIS — I130 Hypertensive heart and chronic kidney disease with heart failure and stage 1 through stage 4 chronic kidney disease, or unspecified chronic kidney disease: Secondary | ICD-10-CM | POA: Diagnosis not present

## 2018-05-01 DIAGNOSIS — Z87440 Personal history of urinary (tract) infections: Secondary | ICD-10-CM | POA: Diagnosis not present

## 2018-05-01 DIAGNOSIS — I5022 Chronic systolic (congestive) heart failure: Secondary | ICD-10-CM | POA: Diagnosis not present

## 2018-05-01 DIAGNOSIS — M6281 Muscle weakness (generalized): Secondary | ICD-10-CM | POA: Diagnosis not present

## 2018-05-01 DIAGNOSIS — N400 Enlarged prostate without lower urinary tract symptoms: Secondary | ICD-10-CM | POA: Diagnosis not present

## 2018-05-01 DIAGNOSIS — M1A9XX0 Chronic gout, unspecified, without tophus (tophi): Secondary | ICD-10-CM | POA: Diagnosis not present

## 2018-05-01 DIAGNOSIS — I2510 Atherosclerotic heart disease of native coronary artery without angina pectoris: Secondary | ICD-10-CM | POA: Diagnosis not present

## 2018-05-01 DIAGNOSIS — E785 Hyperlipidemia, unspecified: Secondary | ICD-10-CM | POA: Diagnosis not present

## 2018-05-01 DIAGNOSIS — Z8673 Personal history of transient ischemic attack (TIA), and cerebral infarction without residual deficits: Secondary | ICD-10-CM | POA: Diagnosis not present

## 2018-05-01 DIAGNOSIS — N183 Chronic kidney disease, stage 3 (moderate): Secondary | ICD-10-CM | POA: Diagnosis not present

## 2018-05-06 DIAGNOSIS — N400 Enlarged prostate without lower urinary tract symptoms: Secondary | ICD-10-CM | POA: Diagnosis not present

## 2018-05-06 DIAGNOSIS — I255 Ischemic cardiomyopathy: Secondary | ICD-10-CM | POA: Diagnosis not present

## 2018-05-06 DIAGNOSIS — N183 Chronic kidney disease, stage 3 (moderate): Secondary | ICD-10-CM | POA: Diagnosis not present

## 2018-05-06 DIAGNOSIS — M6281 Muscle weakness (generalized): Secondary | ICD-10-CM | POA: Diagnosis not present

## 2018-05-06 DIAGNOSIS — Z8673 Personal history of transient ischemic attack (TIA), and cerebral infarction without residual deficits: Secondary | ICD-10-CM | POA: Diagnosis not present

## 2018-05-06 DIAGNOSIS — Z87440 Personal history of urinary (tract) infections: Secondary | ICD-10-CM | POA: Diagnosis not present

## 2018-05-06 DIAGNOSIS — I5022 Chronic systolic (congestive) heart failure: Secondary | ICD-10-CM | POA: Diagnosis not present

## 2018-05-06 DIAGNOSIS — E785 Hyperlipidemia, unspecified: Secondary | ICD-10-CM | POA: Diagnosis not present

## 2018-05-06 DIAGNOSIS — I2510 Atherosclerotic heart disease of native coronary artery without angina pectoris: Secondary | ICD-10-CM | POA: Diagnosis not present

## 2018-05-06 DIAGNOSIS — M1A9XX0 Chronic gout, unspecified, without tophus (tophi): Secondary | ICD-10-CM | POA: Diagnosis not present

## 2018-05-06 DIAGNOSIS — I482 Chronic atrial fibrillation: Secondary | ICD-10-CM | POA: Diagnosis not present

## 2018-05-06 DIAGNOSIS — I130 Hypertensive heart and chronic kidney disease with heart failure and stage 1 through stage 4 chronic kidney disease, or unspecified chronic kidney disease: Secondary | ICD-10-CM | POA: Diagnosis not present

## 2018-05-09 ENCOUNTER — Encounter: Payer: Self-pay | Admitting: Internal Medicine

## 2018-05-09 DIAGNOSIS — N183 Chronic kidney disease, stage 3 (moderate): Secondary | ICD-10-CM | POA: Diagnosis not present

## 2018-05-09 DIAGNOSIS — N400 Enlarged prostate without lower urinary tract symptoms: Secondary | ICD-10-CM | POA: Diagnosis not present

## 2018-05-09 DIAGNOSIS — I2510 Atherosclerotic heart disease of native coronary artery without angina pectoris: Secondary | ICD-10-CM | POA: Diagnosis not present

## 2018-05-09 DIAGNOSIS — E785 Hyperlipidemia, unspecified: Secondary | ICD-10-CM | POA: Diagnosis not present

## 2018-05-09 DIAGNOSIS — I255 Ischemic cardiomyopathy: Secondary | ICD-10-CM | POA: Diagnosis not present

## 2018-05-09 DIAGNOSIS — I5022 Chronic systolic (congestive) heart failure: Secondary | ICD-10-CM | POA: Diagnosis not present

## 2018-05-09 DIAGNOSIS — M1A9XX0 Chronic gout, unspecified, without tophus (tophi): Secondary | ICD-10-CM | POA: Diagnosis not present

## 2018-05-09 DIAGNOSIS — Z8673 Personal history of transient ischemic attack (TIA), and cerebral infarction without residual deficits: Secondary | ICD-10-CM | POA: Diagnosis not present

## 2018-05-09 DIAGNOSIS — Z87440 Personal history of urinary (tract) infections: Secondary | ICD-10-CM | POA: Diagnosis not present

## 2018-05-09 DIAGNOSIS — M6281 Muscle weakness (generalized): Secondary | ICD-10-CM | POA: Diagnosis not present

## 2018-05-09 DIAGNOSIS — I482 Chronic atrial fibrillation: Secondary | ICD-10-CM | POA: Diagnosis not present

## 2018-05-09 DIAGNOSIS — I130 Hypertensive heart and chronic kidney disease with heart failure and stage 1 through stage 4 chronic kidney disease, or unspecified chronic kidney disease: Secondary | ICD-10-CM | POA: Diagnosis not present

## 2018-05-26 ENCOUNTER — Encounter: Payer: Self-pay | Admitting: Neurology

## 2018-05-26 ENCOUNTER — Ambulatory Visit (INDEPENDENT_AMBULATORY_CARE_PROVIDER_SITE_OTHER): Payer: Medicare Other | Admitting: Psychology

## 2018-05-26 ENCOUNTER — Ambulatory Visit (INDEPENDENT_AMBULATORY_CARE_PROVIDER_SITE_OTHER): Payer: Medicare Other | Admitting: Neurology

## 2018-05-26 VITALS — BP 110/84 | HR 76 | Ht 71.0 in | Wt 214.2 lb

## 2018-05-26 DIAGNOSIS — F4323 Adjustment disorder with mixed anxiety and depressed mood: Secondary | ICD-10-CM

## 2018-05-26 DIAGNOSIS — G40209 Localization-related (focal) (partial) symptomatic epilepsy and epileptic syndromes with complex partial seizures, not intractable, without status epilepticus: Secondary | ICD-10-CM | POA: Diagnosis not present

## 2018-05-26 MED ORDER — PHENYTOIN SODIUM EXTENDED 100 MG PO CAPS: 100.0000 mg | ORAL_CAPSULE | Freq: Two times a day (BID) | ORAL | 11 refills | Status: DC

## 2018-05-26 MED ORDER — LEVETIRACETAM 1000 MG PO TABS: 1000.0000 mg | ORAL_TABLET | Freq: Two times a day (BID) | ORAL | 11 refills | Status: DC

## 2018-05-26 NOTE — Progress Notes (Signed)
NEUROLOGY FOLLOW UP OFFICE NOTE  Louis Ford. [id number] 1946-02-23  HISTORY OF PRESENT ILLNESS: I had the pleasure of seeing Louis Ford in follow-up in the neurology clinic on 05/26/2018.  The patient was last seen 4 months ago for seizures. He is again accompanied by his wife who helps supplement the history today. On his last visit, his wife was reporting visual hallucinations, as well as dizziness and balance issues causing several falls. He had a head CT in February 2019 with no acute changes. His Dilantin level on 300mg  daily dose was 33.1 on 01/27/18. He was instructed to hold dose, repeat level on 01/31/18 was 23.9. He was restarted on Dilantin 100mg  BID and continued on Keppra 1000mg  BID. He and his wife deny any further falls since February. He denies any dizziness. No seizures since November 2018. His wife denies any visual hallucinations as well. Overall he is doing better, he is mostly reporting body pains, with pain on left knee and pain around his chest when coughing. He denies any headaches, diplopia, focal numbness/tingling/weakness.  HPI 10/07/2017: This is a 72 yo RH man with a history of hypertension, hyperlipidemia, cardiomyopathy s/p AICD, atrial fibrillation on anticoagulation with Savaysa, factor VII deficiency, prior stroke with no residual deficits, and seizures. He moved to [place] 3 years ago and comes today with his wife. He reports seizures started in his early 20s while he was living in [place]. He also reports being told he had a stroke but denied any deficits from it. He has had several hospital/ER visits to [facility] since 2013, records were reviewed. He denies any prior warning to the seizures. He has been married to his wife for a year, she has only seen 2 seizures, when he was admitted last September 2018, and one a week ago at 1am. She describes humming, unresponsiveness, body stiff but no convulsive activity. Records from prior ER visits from 2013 to 2015 indicate staring  spells and generalized tonic-clonic seizures. The last ER visit for seizure prior to September 2018 was in October 2016. He was previously on Keppra and Dilantin at some point in [place], and when he moved here and had the seizures initially, he was not taking seizure medication. Dilantin appears to have been prescribed from the ER at some point. He has been taking Dilantin 100mg  BID and denied missing medication when he was admitted for breakthrough seizures last 09/08/17. His wife reports he had 2 seizures where he was humming and unresponsive for a few minutes. In the ER, he had 2 witnessed complex partial seizures with note of diaphoresis, humming, followed by staring off, lip smacking, and picking objects. This lasted 5 minutes, he received 2 doses of Ativan. His awake and drowsy EEG showed diffuse beta activity, no epileptiform discharges. His head CT did not show any acute changes, there was chronic stable microvascular disease. He had a head CT in October 2013 with note of encephalomalacia in the medial left temporal lobe, this is slightly visible on last brain imaging. His Dilantin level was 10, he was discharged home on higher dose Dilantin 300mg  qhs and Keppra 750mg  BID. He denies any side effects to the medications. His wife administers medications. She reports one more seizures since hospitalization that occurred a week ago at 1am while in bed, he became diaphoretic, said it was hot and turned the fan on, then started humming for a few minutes.   He denies any dizziness, diplopia, dysarthria/dysphagia, neck/back pain, bowel/bladder dysfunction. He has a  little shakiness in both hands. He denies any gaps in time, olfactory/gustatory hallucinations, deja vu, rising epigastric sensation, focal numbness/tingling/weakness, myoclonic jerks. He reports waking up today feeling a little left-sided chest pain and warm feeling in his left arm. He reports a sensation in his head like there is water going from  side to side, he has occasional left-sided head pain. He does not sleep well, he is up at night watching TV, goes to bed from 4am to 10am. He denies any alcohol use. He feels his memory is good. His wife has taken over medications since his hospital stay, he would take his medication in the pillbox and want to fill it back up and take again, or sometimes he would not take all the medications in the box.   Epilepsy Risk Factors:  History of stroke. There is medial left temporal encephalomalacia on head CT. Otherwise he had a normal birth and early development.  There is no history of febrile convulsions, CNS infections such as meningitis/encephalitis, significant traumatic brain injury, neurosurgical procedures, or family history of seizures.  PAST MEDICAL HISTORY: Past Medical History:  Diagnosis Date  . AICD (automatic cardioverter/defibrillator) present   . Atrial fibrillation   09/22/2012  . Blood loss anemia 04/18/2017   After GI bleed from colonoscopy and polypectomy  . CAD (coronary artery disease)   . Chronic systolic heart failure ([place])   . 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 GIB (gastrointestinal bleeding) 02/20/2017  . History of MRSA infection 05/2011  . HTN (hypertension)   . Hx of adenomatous colonic polyps 02/20/2017   01/2017 - 3 cm sigmoid TV adenoma and other smaller polyps - had post-polypectomy bleed Tx w/ clips Consider repeat colonoscopy 3 yrs [name], MD, [name]   . Hyperlipidemia   . implantable cardiac defibrillator-[employer]    Device Implanted 2006; s/p gen change 03/2011 : bleeding persistent with pocket erosion and infection; explant and reimplant  06/2011  . Ischemic cardiomyopathy    EF 15 to 20% by TTE and TEE in 09/2012.  Severe LV dysfunction  . Obesity    BMI 31 in 09/2012  . Persistent atrial fibrillation with rapid ventricular response ([place]) 04/21/2014  . Seizure disorder ([place]) latest 09/30/2012  . Seizures  ([place])   . Stroke [facility])     MEDICATIONS: Current Outpatient Medications on File Prior to Visit  Medication Sig Dispense Refill  . acetaminophen (TYLENOL) 325 MG tablet Take 650 mg by mouth every 6 (six) hours as needed for mild pain.    [name] amiodarone (PACERONE) 200 MG tablet Take 1 tablet (200 mg total) by mouth daily. 30 tablet 3  . amiodarone (PACERONE) 200 MG tablet TAKE 1 TABLET BY MOUTH ONCE DAILY 30 tablet 6  . BIDIL 20-37.5 MG tablet TAKE TWO TABLETS BY MOUTH THREE TIMES DAILY 180 tablet 6  . carvedilol (COREG) 12.5 MG tablet Take 1 tablet (12.5 mg total) by mouth 2 (two) times daily with a meal. 180 tablet 1  . diclofenac sodium (VOLTAREN) 1 % GEL Apply 4 g topically 4 (four) times daily as needed. 100 g 5  . DULoxetine (CYMBALTA) 30 MG capsule Take 1 capsule (30 mg total) by mouth daily. 30 capsule 11  . edoxaban (SAVAYSA) 60 MG TABS tablet TAKE 60mg  BY MOUTH ONCE DAILY 30 tablet 6  . ferrous sulfate 325 (65 FE) MG tablet Take 1 tablet (325 mg total) 2 (two) times daily with a meal by mouth.  60 tablet 3  . furosemide (LASIX) 40 MG tablet TAKE ONE TABLET BY MOUTH ONCE DAILY (Patient taking differently: TAKE 40mg  BY MOUTH ONCE DAILY) 90 tablet 3  . furosemide (LASIX) 40 MG tablet TAKE ONE TABLET BY MOUTH ONCE DAILY 30 tablet 0  . levETIRAcetam (KEPPRA) 1000 MG tablet Take 1 tablet (1,000 mg total) by mouth 2 (two) times daily. 60 tablet 11  . losartan (COZAAR) 25 MG tablet TAKE 1 TABLET BY MOUTH ONCE DAILY 90 tablet 3  . nystatin (MYCOSTATIN) 100000 UNIT/ML suspension SWISH, GARGLE AND SPIT OUT 5-10 MILLILITERS EVERY 6 HOURS AS NEEDED 240 mL 11  . phenytoin (DILANTIN) 100 MG ER capsule Take 1 capsule (100 mg total) by mouth 2 (two) times daily. 60 capsule 11  . polyethylene glycol (MIRALAX / GLYCOLAX) packet Take 17 g by mouth daily as needed. 14 each 0  . senna-docusate (SENOKOT-S) 8.6-50 MG tablet Take 2 tablets by mouth 2 (two) times daily. 10 tablet 0  . simvastatin (ZOCOR) 20 MG  tablet Take 1 tablet (20 mg total) by mouth daily at 6 PM. 90 tablet 3  . spironolactone (ALDACTONE) 25 MG tablet Take 1 tablet (25 mg total) by mouth daily. 90 tablet 1  . tamsulosin (FLOMAX) 0.4 MG CAPS capsule Take 1 capsule (0.4 mg total) by mouth daily. 90 capsule 3  . triamcinolone cream (KENALOG) 0.1 % Apply 1 application 2 (two) times daily topically. 30 g 0  . Vitamin D, Ergocalciferol, (DRISDOL) 50000 units CAPS capsule Take 1 capsule (50,000 Units total) by mouth every 7 (seven) days. 12 capsule 0   No current facility-administered medications on file prior to visit.     ALLERGIES: No Known Allergies  FAMILY HISTORY: Family History  Problem Relation Age of Onset  . Arthritis Mother   . Heart disease Mother   . Heart attack Mother   . Other Father        smoker  . Hypertension Neg Hx        unknown  . Stroke Neg Hx        unknown    SOCIAL HISTORY: Social History   Socioeconomic History  . Marital status: Married    Spouse name: Not on file  . Number of children: Not on file  . Years of education: Not on file  . Highest education level: Not on file  Occupational History  . Not on file  Social Needs  . Financial resource strain: Not on file  . Food insecurity:    Worry: Not on file    Inability: Not on file  . Transportation needs:    Medical: Not on file    Non-medical: Not on file  Tobacco Use  . Smoking status: Former Smoker    Types: Pipe    Last attempt to quit: 09/21/2008    Years since quitting: 9.6  . Smokeless tobacco: Former Systems developer    Quit date: 09/21/2008  Substance and Sexual Activity  . Alcohol use: No  . Drug use: No  . Sexual activity: Never    Birth control/protection: None  Lifestyle  . Physical activity:    Days per week: Not on file    Minutes per session: Not on file  . Stress: Not on file  Relationships  . Social connections:    Talks on phone: Not on file    Gets together: Not on file    Attends religious service: Not on  file    Active member of club or organization:  Not on file    Attends meetings of clubs or organizations: Not on file    Relationship status: Not on file  . Intimate partner violence:    Fear of current or ex partner: Not on file    Emotionally abused: Not on file    Physically abused: Not on file    Forced sexual activity: Not on file  Other Topics Concern  . Not on file  Social History Narrative   ** Merged History Encounter **        REVIEW OF SYSTEMS: Constitutional: No fevers, chills, or sweats, no generalized fatigue, change in appetite Eyes: No visual changes, double vision, eye pain Ear, nose and throat: No hearing loss, ear pain, nasal congestion, sore throat Cardiovascular: No chest pain, palpitations Respiratory:  No shortness of breath at rest or with exertion, wheezes GastrointestinaI: No nausea, vomiting, diarrhea, abdominal pain, fecal incontinence Genitourinary:  No dysuria, urinary retention or frequency Musculoskeletal:  No neck pain, back pain Integumentary: No rash, pruritus, skin lesions Neurological: as above Psychiatric: No depression, insomnia, anxiety Endocrine: No palpitations, fatigue, diaphoresis, mood swings, change in appetite, change in weight, increased thirst Hematologic/Lymphatic:  No anemia, purpura, petechiae. Allergic/Immunologic: no itchy/runny eyes, nasal congestion, recent allergic reactions, rashes  PHYSICAL EXAM: Vitals:   05/26/18 1132  BP: 110/84  Pulse: 76  SpO2: 94%   General: No acute distress Head:  Normocephalic, atraumatic Neck: supple, no paraspinal tenderness, full range of motion Heart:  Regular rate and rhythm Lungs:  Clear to auscultation bilaterally Back: No paraspinal tenderness Skin/Extremities: No rash, no edema Neurological Exam: alert and oriented to person, place, and time. No aphasia or dysarthria. Fund of knowledge is appropriate.  Recent and remote memory are intact.  Attention and concentration are normal.     Able to name objects and repeat phrases. Cranial nerves: Pupils equal, round, reactive to light. Extraocular movements intact with no nystagmus. Visual fields full. Facial sensation intact. No facial asymmetry. Tongue, uvula, palate midline.  Motor: Bulk and tone normal, muscle strength 5/5 throughout with no pronator drift.  Sensation to light touch intact.  No extinction to double simultaneous stimulation.  Deep tendon reflexes+1+ throughout, toes downgoing.  Finger to nose testing intact.  Gait much improved, slightly wide-based, no ataxia.  Romberg negative.  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 Head CT showed medial left temporal encephalomalacia. Routine EEG unremarkable. No seizures since November 2018 when Keppra was increased to 1000mg  BID. He was having falls and dizziness in February with note of supratherapeutic Dilantin level, he is now on lower dose Dilantin 100mg  BID with no further falls, imbalance, or dizziness. The visual hallucinations have quieted down as well. Continue current medications, refills sent. He does not drive. He will follow-up in 6 months and knows to call for any changes  Thank you for allowing me to participate in his care.  Please do not hesitate to call for any questions or concerns.  The duration of this appointment visit was 20 minutes of face-to-face time with the patient.  Greater than 50% of this time was spent in counseling, explanation of diagnosis, planning of further management, and coordination of care.   Ellouise Newer, M.D.   CC: Dr. Sharlet Salina

## 2018-05-26 NOTE — Patient Instructions (Signed)
Looking good! Continue on Keppra 1000mg  twice a day and Dilantin 100mg  twice a day. Follow-up in 6 months, call for any changes.  Seizure Precautions: 1. If medication has been prescribed for you to prevent seizures, take it exactly as directed.  Do not stop taking the medicine without talking to your doctor first, even if you have not had a seizure in a long time.   2. Avoid activities in which a seizure would cause danger to yourself or to others.  Don't operate dangerous machinery, swim alone, or climb in high or dangerous places, such as on ladders, roofs, or girders.  Do not drive unless your doctor says you may.  3. If you have any warning that you may have a seizure, lay down in a safe place where you can't hurt yourself.    4.  No driving for 6 months from last seizure, as per [facility].   Please refer to the following link on the [place] website for more information: http://[email or web address]   5.  Maintain good sleep hygiene. Avoid alcohol.  6.  Contact your doctor if you have any problems that may be related to the medicine you are taking.  7.  Call 911 and bring the patient back to the ED if:        A.  The seizure lasts longer than 5 minutes.       B.  The patient doesn't awaken shortly after the seizure  C.  The patient has new problems such as difficulty seeing, speaking or moving  D.  The patient was injured during the seizure  E.  The patient has a temperature over 102 F (39C)  F.  The patient vomited and now is having trouble breathing

## 2018-05-31 ENCOUNTER — Other Ambulatory Visit (HOSPITAL_COMMUNITY): Payer: Self-pay | Admitting: Cardiology

## 2018-05-31 ENCOUNTER — Other Ambulatory Visit: Payer: Self-pay | Admitting: Internal Medicine

## 2018-06-02 NOTE — Telephone Encounter (Signed)
Dr. Haroldine Laws is the primary cardiologist. Please address

## 2018-06-12 ENCOUNTER — Telehealth: Payer: Self-pay | Admitting: Cardiology

## 2018-06-12 NOTE — Telephone Encounter (Signed)
LMOVM for patient to return call in regards to his home monitor not working.

## 2018-06-13 NOTE — Telephone Encounter (Signed)
Spoke w/ patient and his wife and informed her that patient home monitor has not updated since 12-30-2017. Patient verbalized that the green ok light is on and that he sleeps near the monitor every night. Instructed pt and his wife to call tech support for further help trouble shooting the home monitor. Pt wife verbalized understanding.

## 2018-06-19 ENCOUNTER — Telehealth (HOSPITAL_COMMUNITY): Payer: Self-pay

## 2018-06-19 NOTE — Telephone Encounter (Signed)
Patient was approved through the [employer] for Bidil (Isosorbide) effective 06/19/18 through 06/19/2019. Amount available is $1000 for Bidil.

## 2018-06-25 ENCOUNTER — Ambulatory Visit (INDEPENDENT_AMBULATORY_CARE_PROVIDER_SITE_OTHER): Payer: Medicare Other | Admitting: *Deleted

## 2018-06-25 DIAGNOSIS — I255 Ischemic cardiomyopathy: Secondary | ICD-10-CM | POA: Diagnosis not present

## 2018-06-25 DIAGNOSIS — I5022 Chronic systolic (congestive) heart failure: Secondary | ICD-10-CM

## 2018-06-25 LAB — CUP PACEART INCLINIC DEVICE CHECK
HighPow Impedance: 69 Ohm
Implantable Lead Implant Date: 20060927
Implantable Lead Location: 753860
Lead Channel Setting Pacing Pulse Width: 0.4 ms
Lead Channel Setting Sensing Sensitivity: 0.8 mV
MDC IDC LEAD SERIAL: 10260064
MDC IDC MSMT BATTERY VOLTAGE: 2.93 V
MDC IDC MSMT LEADCHNL RV IMPEDANCE VALUE: 509 Ohm
MDC IDC MSMT LEADCHNL RV PACING THRESHOLD AMPLITUDE: 0.6 V
MDC IDC MSMT LEADCHNL RV PACING THRESHOLD PULSEWIDTH: 0.4 ms
MDC IDC MSMT LEADCHNL RV SENSING INTR AMPL: 12.3 mV
MDC IDC PG IMPLANT DT: 20120413
MDC IDC PG SERIAL: 60613417
MDC IDC SESS DTM: 20190703151903
MDC IDC SET LEADCHNL RV PACING AMPLITUDE: 2 V

## 2018-06-25 NOTE — Progress Notes (Signed)
ICD check in clinic. Normal device function. Threshold and sensing consistent with previous device measurements. Impedance trends stable over time. No evidence of any ventricular arrhythmias. Histogram distribution appropriate for patient and level of activity. No changes made this session. Device programmed at appropriate safety margins. Device programmed to optimize intrinsic conduction. Estimated longevity 13% remaining, MOL2. Multiple attempts made to reset home monitoring, unsuccessful, appt made next week for industry to reset with tech services. ROV with GT 09/23/18.

## 2018-06-30 ENCOUNTER — Ambulatory Visit (INDEPENDENT_AMBULATORY_CARE_PROVIDER_SITE_OTHER): Payer: Medicare Other | Admitting: Psychology

## 2018-06-30 DIAGNOSIS — F4323 Adjustment disorder with mixed anxiety and depressed mood: Secondary | ICD-10-CM

## 2018-07-02 ENCOUNTER — Ambulatory Visit (INDEPENDENT_AMBULATORY_CARE_PROVIDER_SITE_OTHER): Payer: Self-pay | Admitting: *Deleted

## 2018-07-02 DIAGNOSIS — I255 Ischemic cardiomyopathy: Secondary | ICD-10-CM

## 2018-07-02 DIAGNOSIS — Z95810 Presence of automatic (implantable) cardiac defibrillator: Secondary | ICD-10-CM

## 2018-07-02 NOTE — Progress Notes (Signed)
Ronne Binning, [employer] rep, successfully reset patient's home monitor.  No programming changes.  ROV with GT on 09/23/18.

## 2018-08-15 ENCOUNTER — Ambulatory Visit (INDEPENDENT_AMBULATORY_CARE_PROVIDER_SITE_OTHER): Payer: Medicare Other | Admitting: Psychology

## 2018-08-15 DIAGNOSIS — F4323 Adjustment disorder with mixed anxiety and depressed mood: Secondary | ICD-10-CM

## 2018-08-29 ENCOUNTER — Other Ambulatory Visit: Payer: Self-pay | Admitting: Internal Medicine

## 2018-09-23 ENCOUNTER — Encounter: Payer: Self-pay | Admitting: Internal Medicine

## 2018-09-23 ENCOUNTER — Ambulatory Visit: Payer: Medicare Other | Admitting: Internal Medicine

## 2018-09-23 VITALS — BP 122/70 | HR 78 | Ht 71.0 in | Wt 222.0 lb

## 2018-09-23 DIAGNOSIS — I255 Ischemic cardiomyopathy: Secondary | ICD-10-CM

## 2018-09-23 DIAGNOSIS — I10 Essential (primary) hypertension: Secondary | ICD-10-CM | POA: Diagnosis not present

## 2018-09-23 DIAGNOSIS — Z95810 Presence of automatic (implantable) cardiac defibrillator: Secondary | ICD-10-CM

## 2018-09-23 DIAGNOSIS — I480 Paroxysmal atrial fibrillation: Secondary | ICD-10-CM

## 2018-09-23 DIAGNOSIS — I5022 Chronic systolic (congestive) heart failure: Secondary | ICD-10-CM | POA: Diagnosis not present

## 2018-09-23 LAB — CUP PACEART INCLINIC DEVICE CHECK
Battery Voltage: 2.92 V
HIGH POWER IMPEDANCE MEASURED VALUE: 68 Ohm
Implantable Lead Location: 753860
Implantable Lead Model: 351
Implantable Lead Serial Number: 10260064
Lead Channel Impedance Value: 523 Ohm
Lead Channel Pacing Threshold Pulse Width: 0.4 ms
Lead Channel Setting Sensing Sensitivity: 0.8 mV
MDC IDC LEAD IMPLANT DT: 20060927
MDC IDC MSMT LEADCHNL RV PACING THRESHOLD AMPLITUDE: 0.5 V
MDC IDC MSMT LEADCHNL RV SENSING INTR AMPL: 12.8 mV
MDC IDC PG IMPLANT DT: 20120413
MDC IDC PG SERIAL: 60613417
MDC IDC SESS DTM: 20191001144710
MDC IDC SET LEADCHNL RV PACING AMPLITUDE: 2 V
MDC IDC SET LEADCHNL RV PACING PULSEWIDTH: 0.4 ms

## 2018-09-23 MED ORDER — AMIODARONE HCL 200 MG PO TABS: 200.0000 mg | ORAL_TABLET | Freq: Every day | ORAL | 3 refills | Status: DC

## 2018-09-23 MED ORDER — FUROSEMIDE 40 MG PO TABS: 40.0000 mg | ORAL_TABLET | Freq: Every day | ORAL | 3 refills | Status: DC

## 2018-09-23 NOTE — Progress Notes (Signed)
HPI Louis Ford returns today for ICD followup. He is a pleasant 72 yo man, who has a h/o HTN, PAF and an ICM with chronic class 2 CHF. He has had a long absence from our arrhythmia clinic but has had no ICD shock. No chest pain. He has had some problems with increased peripheral edema. He admits to sodium indiscretion. No Known Allergies   Current Outpatient Medications  Medication Sig Dispense Refill  . acetaminophen (TYLENOL) 325 MG tablet Take 650 mg by mouth every 6 (six) hours as needed for mild pain.    [name] amiodarone (PACERONE) 200 MG tablet Take 1 tablet (200 mg total) by mouth daily. 30 tablet 3  . BIDIL 20-37.5 MG tablet TAKE TWO TABLETS BY MOUTH THREE TIMES DAILY 180 tablet 6  . carvedilol (COREG) 12.5 MG tablet TAKE 1 TABLET BY MOUTH TWICE DAILY WITH A MEAL 180 tablet 1  . diclofenac sodium (VOLTAREN) 1 % GEL Apply 4 g topically 4 (four) times daily as needed. 100 g 5  . DULoxetine (CYMBALTA) 30 MG capsule Take 1 capsule (30 mg total) by mouth daily. 30 capsule 11  . edoxaban (SAVAYSA) 60 MG TABS tablet TAKE 60mg  BY MOUTH ONCE DAILY 30 tablet 6  . ferrous sulfate 325 (65 FE) MG tablet Take 1 tablet (325 mg total) 2 (two) times daily with a meal by mouth. 60 tablet 3  . furosemide (LASIX) 40 MG tablet TAKE ONE TABLET BY MOUTH ONCE DAILY 90 tablet 3  . levETIRAcetam (KEPPRA) 1000 MG tablet Take 1 tablet (1,000 mg total) by mouth 2 (two) times daily. 60 tablet 11  . losartan (COZAAR) 25 MG tablet TAKE 1 TABLET BY MOUTH ONCE DAILY 90 tablet 3  . nystatin (MYCOSTATIN) 100000 UNIT/ML suspension SWISH, GARGLE AND SPIT OUT 5-10 MILLILITERS EVERY 6 HOURS AS NEEDED 240 mL 11  . phenytoin (DILANTIN) 100 MG ER capsule Take 1 capsule (100 mg total) by mouth 2 (two) times daily. 60 capsule 11  . polyethylene glycol (MIRALAX / GLYCOLAX) packet Take 17 g by mouth daily as needed. 14 each 0  . senna-docusate (SENOKOT-S) 8.6-50 MG tablet Take 2 tablets by mouth 2 (two) times daily. 10 tablet  0  . simvastatin (ZOCOR) 20 MG tablet Take 1 tablet (20 mg total) by mouth daily at 6 PM. 90 tablet 3  . spironolactone (ALDACTONE) 25 MG tablet TAKE 1 TABLET BY MOUTH ONCE DAILY 90 tablet 1  . tamsulosin (FLOMAX) 0.4 MG CAPS capsule Take 1 capsule (0.4 mg total) by mouth daily. 90 capsule 3  . triamcinolone cream (KENALOG) 0.1 % Apply 1 application 2 (two) times daily topically. 30 g 0  . Vitamin D, Ergocalciferol, (DRISDOL) 50000 units CAPS capsule Take 1 capsule (50,000 Units total) by mouth every 7 (seven) days. 12 capsule 0   No current facility-administered medications for this visit.      Past Medical History:  Diagnosis Date  . AICD (automatic cardioverter/defibrillator) present   . Atrial fibrillation   09/22/2012  . Blood loss anemia 04/18/2017   After GI bleed from colonoscopy and polypectomy  . CAD (coronary artery disease)   . Chronic systolic heart failure ([place])   . 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 GIB (gastrointestinal bleeding) 02/20/2017  . History of MRSA infection 05/2011  . HTN (hypertension)   . Hx of adenomatous colonic polyps 02/20/2017   01/2017 - 3 cm sigmoid TV adenoma and other smaller  polyps - had post-polypectomy bleed Tx w/ clips Consider repeat colonoscopy 3 yrs [name], MD, [facility]   . Hyperlipidemia   . implantable cardiac defibrillator-Biotronik    Device Implanted 2006; s/p gen change 03/2011 : bleeding persistent with pocket erosion and infection; explant and reimplant  06/2011  . Ischemic cardiomyopathy    EF 15 to 20% by TTE and TEE in 09/2012.  Severe LV dysfunction  . Obesity    BMI 31 in 09/2012  . Persistent atrial fibrillation with rapid ventricular response 04/21/2014  . Seizure disorder ([place]) latest 09/30/2012  . Seizures ([place])   . Stroke ([place])     ROS:   All systems reviewed and negative except as noted in the HPI.   Past Surgical History:  Procedure Laterality Date  . CARDIAC  CATHETERIZATION N/A 10/12/2015   Procedure: Right Heart Cath;  Surgeon: [name], MD;  Location: [facility];  Service: Cardiovascular;  Laterality: N/A;  . CARDIOVERSION  09/24/2012   Procedure: CARDIOVERSION;  Surgeon: [name], MD;  Location: [facility] ENDOSCOPY;  Service: Cardiovascular;  Laterality: N/A;  . CARDIOVERSION N/A 12/25/2017   Procedure: CARDIOVERSION;  Surgeon: [name], MD;  Location: [facility] ORS;  Service: Cardiovascular;  Laterality: N/A;  . COLONOSCOPY W/ POLYPECTOMY  02/13/2017  . CORONARY ARTERY BYPASS GRAFT  2011   in [place]  . FLEXIBLE SIGMOIDOSCOPY N/A 02/20/2017   Procedure: FLEXIBLE SIGMOIDOSCOPY;  Surgeon: [name], MD;  Location[facility]COPY;  Service: Endoscopy;  Laterality: N/A;  . FLEXIBLE SIGMOIDOSCOPY N/A 02/21/2017   Procedure: FLEXIBLE SIGMOIDOSCOPY;  Surgeon: [name], MD;  Location: [facility] ENDOSCOPY;  Service: Endoscopy;  Laterality: N/A;  . ICD    . TEE WITHOUT CARDIOVERSION  09/24/2012   Procedure: TRANSESOPHAGEAL ECHOCARDIOGRAM (TEE);  Surgeon: [name], MD;  Location: [facility] ENDOSCOPY;  Service: Cardiovascular;  Laterality: N/A;  dave/anesth, dl, cindy/echo      Family History  Problem Relation Age of Onset  . Arthritis Mother   . Heart disease Mother   . Heart attack Mother   . Other Father        smoker  . Hypertension Neg Hx        unknown  . Stroke Neg Hx        unknown     Social History   Socioeconomic History  . Marital status: Married    Spouse name: Not on file  . Number of children: Not on file  . Years of education: Not on file  . Highest education level: Not on file  Occupational History  . Not on file  Social Needs  . Financial resource strain: Not on file  . Food insecurity:    Worry: Not on file    Inability: Not on file  . Transportation needs:    Medical: Not on file    Non-medical: Not on file  Tobacco Use  . Smoking status: Former Smoker    Types: Pipe    Last attempt to quit:  09/21/2008    Years since quitting: 10.0  . Smokeless tobacco: Former Systems developer    Quit date: 09/21/2008  Substance and Sexual Activity  . Alcohol use: No  . Drug use: No  . Sexual activity: Never    Birth control/protection: None  Lifestyle  . Physical activity:    Days per week: Not on file    Minutes per session: Not on file  . Stress: Not on file  Relationships  . Social  connections:    Talks on phone: Not on file    Gets together: Not on file    Attends religious service: Not on file    Active member of club or organization: Not on file    Attends meetings of clubs or organizations: Not on file    Relationship status: Not on file  . Intimate partner violence:    Fear of current or ex partner: Not on file    Emotionally abused: Not on file    Physically abused: Not on file    Forced sexual activity: Not on file  Other Topics Concern  . Not on file  Social History Narrative   ** Merged History Encounter **         BP 122/70   Pulse 78   Ht 5\' 11"  (1.803 m)   Wt 222 lb (100.7 kg)   BMI 30.96 kg/m   Physical Exam:  Well appearing NAD HEENT: Unremarkable Neck:  No JVD, no thyromegally Lymphatics:  No adenopathy Back:  No CVA tenderness Lungs:  Clear with no wheezes HEART:  Regular rate rhythm, no murmurs, no rubs, no clicks Abd:  soft, positive bowel sounds, no organomegally, no rebound, no guarding Ext:  2 plus pulses, no edema, no cyanosis, no clubbing Skin:  No rashes no nodules Neuro:  CN II through XII intact, motor grossly intact  EKG - nsr with PAC's and RAE  DEVICE  Normal device function.  See PaceArt for details.   Assess/Plan: 1. Chronic systolic heart failure - his symptoms are class 2. He will continue his current meds. 2. HA - he has had a mostly temporal HA. He will take tylenol which helps. If these continue he will need additional evaluation. 3. Carries - he has poor teeth. I have encouraged him to have these taken care of before he undergoes  ICD gen change. 4. ICD - his Biotronik device is working normally. He is approaching ERI.  Mikle [email or web address].D.

## 2018-09-23 NOTE — Patient Instructions (Signed)
Medication Instructions:  Your physician recommends that you continue on your current medications as directed. Please refer to the Current Medication list given to you today.  Labwork: None ordered.  Testing/Procedures: None ordered.  Follow-Up: Your physician wants you to follow-up in: one year with Dr. Lovena Le.   You will receive a reminder letter in the mail two months in advance. If you don't receive a letter, please call our office to schedule the follow-up appointment.  Remote monitoring is used to monitor your ICD from home. This monitoring reduces the number of office visits required to check your device to one time per year. It allows [place] to keep an eye on the functioning of your device to ensure it is working properly. You are scheduled for a device check from home on 09/25/2018. You may send your transmission at any time that day. If you have a wireless device, the transmission will be sent automatically. After your physician reviews your transmission, you will receive a postcard with your next transmission date.  Any Other Special Instructions Will Be Listed Below (If Applicable).  If you need a refill on your cardiac medications before your next appointment, please call your pharmacy.

## 2018-09-25 ENCOUNTER — Ambulatory Visit (INDEPENDENT_AMBULATORY_CARE_PROVIDER_SITE_OTHER): Payer: Medicare Other | Admitting: *Deleted

## 2018-09-25 DIAGNOSIS — I255 Ischemic cardiomyopathy: Secondary | ICD-10-CM | POA: Diagnosis not present

## 2018-09-25 NOTE — Progress Notes (Signed)
Remote ICD transmission.   

## 2018-09-30 ENCOUNTER — Other Ambulatory Visit: Payer: Self-pay | Admitting: Internal Medicine

## 2018-09-30 DIAGNOSIS — G8929 Other chronic pain: Secondary | ICD-10-CM

## 2018-09-30 DIAGNOSIS — R0789 Other chest pain: Secondary | ICD-10-CM

## 2018-09-30 DIAGNOSIS — G4700 Insomnia, unspecified: Secondary | ICD-10-CM

## 2018-10-22 ENCOUNTER — Other Ambulatory Visit: Payer: Self-pay | Admitting: Internal Medicine

## 2018-11-03 LAB — CUP PACEART REMOTE DEVICE CHECK
Brady Statistic RV Percent Paced: 0 %
Date Time Interrogation Session: 20191111045750
HighPow Impedance: 65 Ohm
Implantable Lead Implant Date: 20060927
Implantable Pulse Generator Implant Date: 20120413
Lead Channel Impedance Value: 494 Ohm
Lead Channel Pacing Threshold Amplitude: 0.6 V
Lead Channel Pacing Threshold Pulse Width: 0.4 ms
Lead Channel Setting Pacing Amplitude: 2 V
MDC IDC LEAD LOCATION: 753860
MDC IDC LEAD SERIAL: 10260064
MDC IDC MSMT BATTERY REMAINING PERCENTAGE: 8 %
MDC IDC MSMT BATTERY VOLTAGE: 2.92 V
MDC IDC SET LEADCHNL RV PACING PULSEWIDTH: 0.4 ms
Pulse Gen Serial Number: 60613417

## 2018-11-26 ENCOUNTER — Ambulatory Visit (INDEPENDENT_AMBULATORY_CARE_PROVIDER_SITE_OTHER): Payer: Medicare Other | Admitting: Internal Medicine

## 2018-11-26 ENCOUNTER — Other Ambulatory Visit (INDEPENDENT_AMBULATORY_CARE_PROVIDER_SITE_OTHER): Payer: Medicare Other

## 2018-11-26 ENCOUNTER — Encounter: Payer: Self-pay | Admitting: Internal Medicine

## 2018-11-26 VITALS — BP 118/80 | HR 60 | Temp 97.6°F | Ht 71.0 in | Wt 207.0 lb

## 2018-11-26 DIAGNOSIS — G40209 Localization-related (focal) (partial) symptomatic epilepsy and epileptic syndromes with complex partial seizures, not intractable, without status epilepticus: Secondary | ICD-10-CM | POA: Diagnosis not present

## 2018-11-26 DIAGNOSIS — I4819 Other persistent atrial fibrillation: Secondary | ICD-10-CM

## 2018-11-26 DIAGNOSIS — R29898 Other symptoms and signs involving the musculoskeletal system: Secondary | ICD-10-CM

## 2018-11-26 DIAGNOSIS — M79601 Pain in right arm: Secondary | ICD-10-CM | POA: Diagnosis not present

## 2018-11-26 DIAGNOSIS — Z23 Encounter for immunization: Secondary | ICD-10-CM

## 2018-11-26 DIAGNOSIS — R29818 Other symptoms and signs involving the nervous system: Secondary | ICD-10-CM

## 2018-11-26 LAB — COMPREHENSIVE METABOLIC PANEL
ALT: 22 U/L (ref 0–53)
AST: 22 U/L (ref 0–37)
Albumin: 3.9 g/dL (ref 3.5–5.2)
Alkaline Phosphatase: 117 U/L (ref 39–117)
BUN: 36 mg/dL — AB (ref 6–23)
CO2: 28 mEq/L (ref 19–32)
Calcium: 9.6 mg/dL (ref 8.4–10.5)
Chloride: 99 mEq/L (ref 96–112)
Creatinine, Ser: 1.66 mg/dL — ABNORMAL HIGH (ref 0.40–1.50)
GFR: 52.93 mL/min — ABNORMAL LOW (ref >60.00)
Glucose, Bld: 105 mg/dL — ABNORMAL HIGH (ref 70–99)
Potassium: 5 mEq/L (ref 3.5–5.1)
SODIUM: 136 meq/L (ref 135–145)
Total Bilirubin: 0.4 mg/dL (ref 0.2–1.2)
Total Protein: 8.5 g/dL — ABNORMAL HIGH (ref 6.0–8.3)

## 2018-11-26 LAB — MAGNESIUM: Magnesium: 1.8 mg/dL (ref 1.5–2.5)

## 2018-11-26 LAB — CBC
HCT: 52.1 % — ABNORMAL HIGH (ref 39.0–52.0)
Hemoglobin: 17 g/dL (ref 13.0–17.0)
MCHC: 32.6 g/dL (ref 30.0–36.0)
MCV: 94 fl (ref 78.0–100.0)
Platelets: 127 10*3/uL — ABNORMAL LOW (ref 150.0–400.0)
RBC: 5.55 Mil/uL (ref 4.22–5.81)
RDW: 14.2 % (ref 11.5–15.5)
WBC: 3.4 10*3/uL — ABNORMAL LOW (ref 4.0–10.5)

## 2018-11-26 LAB — T4, FREE: Free T4: 0.86 ng/dL (ref 0.60–1.60)

## 2018-11-26 LAB — TSH: TSH: 9.8 u[IU]/mL — ABNORMAL HIGH (ref 0.35–4.50)

## 2018-11-26 LAB — BRAIN NATRIURETIC PEPTIDE: Pro B Natriuretic peptide (BNP): 56 pg/mL (ref 0.0–100.0)

## 2018-11-26 LAB — HEMOGLOBIN A1C: Hgb A1c MFr Bld: 5.8 % (ref 4.6–6.5)

## 2018-11-26 MED ORDER — SULFAMETHOXAZOLE-TRIMETHOPRIM 800-160 MG PO TABS: 1.0000 | ORAL_TABLET | Freq: Two times a day (BID) | ORAL | 0 refills | Status: DC

## 2018-11-26 NOTE — Assessment & Plan Note (Signed)
They admit to more seizures in the last several months and describe as more absence or complex seizures. Checking TSH, CBC, CMP. He does have an infected cyst on his arm which could be contributing. Also concern for new stroke. Checking CT head without contrast.

## 2018-11-26 NOTE — Assessment & Plan Note (Signed)
HR okay today, taking coreg and amiodarone. I do not see recent TSH and he has had abnormal one in the past. Checking BNP, TSH, free T4, CBC, CMP.

## 2018-11-26 NOTE — Patient Instructions (Signed)
We will check the blood work today and the CT scan of the head.   We have sent in bactrim to take 1 pill twice a day for 1 week.

## 2018-11-26 NOTE — Progress Notes (Signed)
   Subjective:    Patient ID: Louis Ford., male    DOB: 03-Nov-1946, 72 y.o.   MRN: [id number]  HPI The patient is a 72 YO man coming in for several concerns including bump on arm (right arm, swollen and red, hurts to touch, started 1-2 weeks ago and he is using black and white cream and soap on it which he thinks has helped, denies fevers or chills, some sweating episodes), and change in activity (his wife is with him and admits that he stays in bed all the time and does not move or walk around, uses urinal most of the time, he is hard to motivate, he does not walk at all, she is not sure if he cannot or does not want to, she feels that he does not want to, sometimes recently he will use the walker around the house although she feels he does not need it), and back pain (hurts left flank, for months, denies doing anything at onset, wife thinks it is from lying in the bed, denies urinary symptoms or diarrhea or constipation or blood in stool, denies nausea or vomiting, weight is stable, denies more swelling).   PMH, [facility], social history reviewed and updated  Review of Systems  Constitutional: Positive for activity change, diaphoresis and fatigue. Negative for appetite change.  HENT: Negative.   Eyes: Negative.   Respiratory: Negative for cough, chest tightness and shortness of breath.   Cardiovascular: Negative for chest pain, palpitations and leg swelling.  Gastrointestinal: Negative for abdominal distention, abdominal pain, constipation, diarrhea, nausea and vomiting.  Musculoskeletal: Positive for arthralgias, back pain, gait problem and myalgias.  Skin: Negative.   Neurological: Positive for seizures, weakness, light-headedness and headaches. Negative for dizziness, tremors, syncope, facial asymmetry, speech difficulty and numbness.  Psychiatric/Behavioral: Positive for decreased concentration. Negative for dysphoric mood, self-injury, sleep disturbance and suicidal ideas. The patient is not  nervous/anxious and is not hyperactive.       Objective:   Physical Exam  Constitutional: He appears well-developed and well-nourished.  HENT:  Head: Normocephalic and atraumatic.  Eyes: EOM are normal.  Neck: Normal range of motion.  Cardiovascular: Normal rate and regular rhythm.  Pulmonary/Chest: Effort normal and breath sounds normal. No respiratory distress. He has no wheezes. He has no rales.  Abdominal: Soft. Bowel sounds are normal. He exhibits no distension. There is no tenderness. There is no rebound.  Musculoskeletal: He exhibits tenderness. He exhibits no edema.  Left flank mildly tender to palpation. Midline back pain not present throughout the spine.   Neurological: He is alert. Coordination abnormal.  Cane and slow gait with slow to rise  Skin: Skin is warm and dry.  Psychiatric: He has a normal mood and affect.  Poor historian and his and wife's history do not match   Vitals:   11/26/18 0936  BP: 118/80  Pulse: 60  Temp: 97.6 F (36.4 C)  TempSrc: Oral  Weight: 207 lb (93.9 kg)  Height: 5\' 11"  (1.803 m)      Assessment & Plan:  Flu shot given at visit

## 2018-11-26 NOTE — Assessment & Plan Note (Signed)
Rx for bactrim to help resolve this infection. It is an infected cyst right forearm. Could be causing some of the change in his functional status.

## 2018-11-26 NOTE — Assessment & Plan Note (Signed)
Chronic from stroke and other health conditions. It is worsening gradually in the last several months although history is difficult to elicit. Need to rule out additional stroke. He currently has infected cyst on arm which could affect his mental and physical state.

## 2018-11-28 ENCOUNTER — Other Ambulatory Visit (HOSPITAL_COMMUNITY): Payer: Self-pay | Admitting: Cardiology

## 2018-11-28 ENCOUNTER — Other Ambulatory Visit: Payer: Self-pay | Admitting: Internal Medicine

## 2018-11-28 DIAGNOSIS — N179 Acute kidney failure, unspecified: Secondary | ICD-10-CM

## 2018-12-05 ENCOUNTER — Telehealth: Payer: Self-pay | Admitting: Internal Medicine

## 2018-12-05 ENCOUNTER — Other Ambulatory Visit (INDEPENDENT_AMBULATORY_CARE_PROVIDER_SITE_OTHER): Payer: Medicare Other

## 2018-12-05 ENCOUNTER — Ambulatory Visit
Admission: RE | Admit: 2018-12-05 | Discharge: 2018-12-05 | Disposition: A | Discharge status: Home / Self Care | Payer: Medicare Other | Source: Ambulatory Visit | Attending: Internal Medicine | Admitting: Internal Medicine

## 2018-12-05 DIAGNOSIS — R51 Headache: Secondary | ICD-10-CM | POA: Diagnosis not present

## 2018-12-05 DIAGNOSIS — R42 Dizziness and giddiness: Secondary | ICD-10-CM | POA: Diagnosis not present

## 2018-12-05 DIAGNOSIS — R29818 Other symptoms and signs involving the nervous system: Secondary | ICD-10-CM

## 2018-12-05 DIAGNOSIS — N179 Acute kidney failure, unspecified: Secondary | ICD-10-CM | POA: Diagnosis not present

## 2018-12-05 LAB — BASIC METABOLIC PANEL
BUN: 45 mg/dL — ABNORMAL HIGH (ref 6–23)
CHLORIDE: 101 meq/L (ref 96–112)
CO2: 27 mEq/L (ref 19–32)
CREATININE: 2.21 mg/dL — AB (ref 0.40–1.50)
Calcium: 9.5 mg/dL (ref 8.4–10.5)
GFR: 38.04 mL/min — ABNORMAL LOW (ref >60.00)
Glucose, Bld: 58 mg/dL — ABNORMAL LOW (ref 70–99)
Potassium: 4.8 mEq/L (ref 3.5–5.1)
Sodium: 135 mEq/L (ref 135–145)

## 2018-12-05 NOTE — Telephone Encounter (Signed)
Please call and let them know that there are no signs of stroke on head CT but concerns for dental infection or abscess. They should contact his dentist and take him in as soon as possible to evaluate this.

## 2018-12-05 NOTE — Telephone Encounter (Signed)
LVM informing patient of MD response  

## 2018-12-05 NOTE — Telephone Encounter (Signed)
Stat head CT results called from [facility] Radiology Call placed to [facility] [place] office. Message routed office  IMPRESSION: 1. There is age related volume loss with fairly extensive periventricular small vessel disease. No acute appearing infarct is appreciable.  2.  No intracranial mass or hemorrhage.  3. Air is located along the superior alveolar ridge on the left. Question infectious/inflammatory lesion in this area, possibly of dental etiology. Appropriate clinical assessment of this area is advisable. This appearance raises concern for developing abscess in this area.  4.  Mucosal thickening noted in several ethmoid air cells.  5.  There are foci of arterial vascular calcification.  These results will be called to the ordering clinician or representative by the Radiologist Assistant, and communication documented in the PACS or zVision Dashboard.

## 2018-12-06 ENCOUNTER — Emergency Department (HOSPITAL_COMMUNITY): Payer: Medicare Other

## 2018-12-06 ENCOUNTER — Encounter (HOSPITAL_COMMUNITY): Payer: Self-pay

## 2018-12-06 ENCOUNTER — Emergency Department (HOSPITAL_COMMUNITY)
Admission: EM | Admit: 2018-12-06 | Discharge: 2018-12-06 | Disposition: A | Discharge status: Home / Self Care | Payer: Medicare Other | Attending: Emergency Medicine | Admitting: Emergency Medicine

## 2018-12-06 DIAGNOSIS — Z95810 Presence of automatic (implantable) cardiac defibrillator: Secondary | ICD-10-CM | POA: Insufficient documentation

## 2018-12-06 DIAGNOSIS — I2510 Atherosclerotic heart disease of native coronary artery without angina pectoris: Secondary | ICD-10-CM | POA: Diagnosis not present

## 2018-12-06 DIAGNOSIS — E860 Dehydration: Secondary | ICD-10-CM | POA: Diagnosis not present

## 2018-12-06 DIAGNOSIS — I10 Essential (primary) hypertension: Secondary | ICD-10-CM | POA: Insufficient documentation

## 2018-12-06 DIAGNOSIS — I4891 Unspecified atrial fibrillation: Secondary | ICD-10-CM | POA: Diagnosis not present

## 2018-12-06 DIAGNOSIS — Z79899 Other long term (current) drug therapy: Secondary | ICD-10-CM | POA: Insufficient documentation

## 2018-12-06 DIAGNOSIS — R944 Abnormal results of kidney function studies: Secondary | ICD-10-CM | POA: Diagnosis not present

## 2018-12-06 DIAGNOSIS — Z87891 Personal history of nicotine dependence: Secondary | ICD-10-CM | POA: Insufficient documentation

## 2018-12-06 DIAGNOSIS — R748 Abnormal levels of other serum enzymes: Secondary | ICD-10-CM | POA: Diagnosis not present

## 2018-12-06 DIAGNOSIS — R9431 Abnormal electrocardiogram [ECG] [EKG]: Secondary | ICD-10-CM | POA: Diagnosis not present

## 2018-12-06 LAB — CBC WITH DIFFERENTIAL/PLATELET
ABS IMMATURE GRANULOCYTES: 0.01 10*3/uL (ref 0.00–0.07)
Basophils Absolute: 0 10*3/uL (ref 0.0–0.1)
Basophils Relative: 1 %
Eosinophils Absolute: 0.3 10*3/uL (ref 0.0–0.5)
Eosinophils Relative: 9 %
HCT: 53 % — ABNORMAL HIGH (ref 39.0–52.0)
Hemoglobin: 16.2 g/dL (ref 13.0–17.0)
Immature Granulocytes: 0 %
LYMPHS PCT: 28 %
Lymphs Abs: 1 10*3/uL (ref 0.7–4.0)
MCH: 28.7 pg (ref 26.0–34.0)
MCHC: 30.6 g/dL (ref 30.0–36.0)
MCV: 94 fL (ref 80.0–100.0)
Monocytes Absolute: 0.6 10*3/uL (ref 0.1–1.0)
Monocytes Relative: 17 %
NRBC: 0 % (ref 0.0–0.2)
Neutro Abs: 1.6 10*3/uL — ABNORMAL LOW (ref 1.7–7.7)
Neutrophils Relative %: 45 %
Platelets: 159 10*3/uL (ref 150–400)
RBC: 5.64 MIL/uL (ref 4.22–5.81)
RDW: 13.9 % (ref 11.5–15.5)
WBC: 3.4 10*3/uL — ABNORMAL LOW (ref 4.0–10.5)

## 2018-12-06 LAB — COMPREHENSIVE METABOLIC PANEL
ALT: 27 U/L (ref 0–44)
AST: 29 U/L (ref 15–41)
Albumin: 3.8 g/dL (ref 3.5–5.0)
Alkaline Phosphatase: 109 U/L (ref 38–126)
Anion gap: 10 (ref 5–15)
BUN: 38 mg/dL — ABNORMAL HIGH (ref 8–23)
CO2: 24 mmol/L (ref 22–32)
CREATININE: 1.89 mg/dL — AB (ref 0.61–1.24)
Calcium: 8.8 mg/dL — ABNORMAL LOW (ref 8.9–10.3)
Chloride: 99 mmol/L (ref 98–111)
GFR calc Af Amer: 41 mL/min — ABNORMAL LOW (ref >60)
GFR calc non Af Amer: 35 mL/min — ABNORMAL LOW (ref >60)
Glucose, Bld: 79 mg/dL (ref 70–99)
Potassium: 4.9 mmol/L (ref 3.5–5.1)
Sodium: 133 mmol/L — ABNORMAL LOW (ref 135–145)
Total Bilirubin: 0.8 mg/dL (ref 0.3–1.2)
Total Protein: 8.3 g/dL — ABNORMAL HIGH (ref 6.5–8.1)

## 2018-12-06 LAB — URINALYSIS, ROUTINE W REFLEX MICROSCOPIC
Bilirubin Urine: NEGATIVE
Glucose, UA: NEGATIVE mg/dL
Hgb urine dipstick: NEGATIVE
Ketones, ur: NEGATIVE mg/dL
Nitrite: NEGATIVE
PROTEIN: NEGATIVE mg/dL
Specific Gravity, Urine: 1.013 (ref 1.005–1.030)
pH: 6 (ref 5.0–8.0)

## 2018-12-06 LAB — BRAIN NATRIURETIC PEPTIDE: B Natriuretic Peptide: 36.4 pg/mL (ref 0.0–100.0)

## 2018-12-06 MED ORDER — SODIUM CHLORIDE 0.9 % IV BOLUS: 1000.0000 mL | Freq: Once | INTRAVENOUS | Status: DC

## 2018-12-06 MED ORDER — SODIUM CHLORIDE 0.9 % IV BOLUS: 500.0000 mL | Freq: Once | INTRAVENOUS | Status: DC

## 2018-12-06 NOTE — ED Notes (Signed)
Gave pt OJ, graham crackers and peanut butter.

## 2018-12-06 NOTE — ED Provider Notes (Signed)
[facility] EMERGENCY DEPARTMENT Provider Note   CSN: [id number] Arrival date & time: 12/06/18  1742     History   Chief Complaint Chief Complaint  Patient presents with  . Abnormal Lab    HPI [facility]. is a 72 y.o. male.  72 y.o male with a PMH of Afib, HTN,CHF, Cardiomyopathy presents to the ED sent in by PCP for dehydration. Patient was seen by his PCP two weeks ago and advised to drink more fluids, wife reports she tried to have him follow up with PCP but patient does not like attending doctor's appointments. He was seen by PCP Dr. Pricilla Holm yesterday and has blood work obtained. He was told his creatine function was worsening and he needed to be seen in the ED for dehydration and fluids. Wife at the bedside reports patient has been more fatigue, has lost of interest in activities like watching TV and is overall not himself. Wife reports she's been giving him gatorade, and gingerale as much as she can. She also has not been giving him his Lasixs for the past two days. He denies any chest pain, shortness of breath, back pain, abdominal pain or urinary symptoms.      Past Medical History:  Diagnosis Date  . AICD (automatic cardioverter/defibrillator) present   . Atrial fibrillation   09/22/2012  . Blood loss anemia 04/18/2017   After GI bleed from colonoscopy and polypectomy  . CAD (coronary artery disease)   . Chronic systolic heart failure ([place])   . 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 GIB (gastrointestinal bleeding) 02/20/2017  . History of MRSA infection 05/2011  . HTN (hypertension)   . Hx of adenomatous colonic polyps 02/20/2017   01/2017 - 3 cm sigmoid TV adenoma and other smaller polyps - had post-polypectomy bleed Tx w/ clips Consider repeat colonoscopy 3 yrs [name], MD, [name]   . Hyperlipidemia   . implantable cardiac defibrillator-Biotronik    Device Implanted 2006; s/p gen change  03/2011 : bleeding persistent with pocket erosion and infection; explant and reimplant  06/2011  . Ischemic cardiomyopathy    EF 15 to 20% by TTE and TEE in 09/2012.  Severe LV dysfunction  . Obesity    BMI 31 in 09/2012  . Persistent atrial fibrillation with rapid ventricular response 04/21/2014  . Seizure disorder ([place]) latest 09/30/2012  . Seizures ([place])   . Stroke [facility])     Patient Active Problem List   Diagnosis Date Noted  . Right arm pain 11/26/2018  . Hematoma 03/26/2018  . Leg pain 03/04/2018  . 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 Elevated TSH 10/27/2017  . Weakness of both lower extremities 10/27/2017  . Unsteady gait 10/27/2017  . Localization-related symptomatic epilepsy and epileptic syndromes with complex partial seizures, not intractable, without status epilepticus ([place]) 10/08/2017  . Insomnia 09/02/2017  . Degenerative joint disease of knee, left 08/01/2017  . Blood loss anemia 04/18/2017  . Hx of adenomatous colonic polyps 02/20/2017  . Chronic pain of left knee 01/22/2017  . Chronic kidney disease (CKD), stage III (moderate) ([place]) 11/08/2016  . Hypertensive heart disease without heart failure   . Chronic chest wall pain 08/17/2016  . Periodontal disease 05/18/2016  . HLD (hyperlipidemia) 02/26/2016  . CVA (cerebral infarction) 02/26/2016  . Near syncope 02/26/2016  . Gout 12/30/2015  . Persistent atrial fibrillation with rapid ventricular response 04/21/2014  . Chronic systolic CHF (congestive heart failure), NYHA  class 2 ([place]) 11/06/2012  . 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 Seizure ([place]) 10/01/2012  . Renal insufficiency 10/01/2012  . Ischemic cardiomyopathy  ? additional rate component 09/29/2012  . Automatic implantable cardioverter-defibrillator in situ   . HTN (hypertension) 09/21/2012  . Coronary artery disease involving native heart 09/21/2012    Past Surgical History:  Procedure Laterality Date  . CARDIAC CATHETERIZATION N/A 10/12/2015     Procedure: Right Heart Cath;  Surgeon: [name], MD;  Location: [facility] CV LAB;  Service: Cardiovascular;  Laterality: N/A;  . CARDIOVERSION  09/24/2012   Procedure: CARDIOVERSION;  Surgeon: [name], MD;  Location: [facility] ENDOSCOPY;  Service: Cardiovascular;  Laterality: N/A;  . CARDIOVERSION N/A 12/25/2017   Procedure: CARDIOVERSION;  Surgeon: [name], MD;  Location: [facility] ORS;  Service: Cardiovascular;  Laterality: N/A;  . COLONOSCOPY W/ POLYPECTOMY  02/13/2017  . CORONARY ARTERY BYPASS GRAFT  2011   in [place]  . FLEXIBLE SIGMOIDOSCOPY N/A 02/20/2017   Procedure: FLEXIBLE SIGMOIDOSCOPY;  Surgeon: [name], MD;  Location: [facility] ENDOSCOPY;  Service: Endoscopy;  Laterality: N/A;  . FLEXIBLE SIGMOIDOSCOPY N/A 02/21/2017   Procedure: FLEXIBLE SIGMOIDOSCOPY;  Surgeon: [name], MD;  Location: [facility] ENDOSCOPY;  Service: Endoscopy;  Laterality: N/A;  . ICD    . TEE WITHOUT CARDIOVERSION  09/24/2012   Procedure: TRANSESOPHAGEAL ECHOCARDIOGRAM (TEE);  Surgeon: [name], MD;  Location: [facility] ENDOSCOPY;  Service: Cardiovascular;  Laterality: N/A;  dave/anesth, dl, cindy/echo         Home Medications    Prior to Admission medications   Medication Sig Start Date End Date Taking? Authorizing Provider  acetaminophen (TYLENOL) 325 MG tablet Take 650 mg by mouth every 6 (six) hours as needed for mild pain.   Yes [provider]  amiodarone (PACERONE) 200 MG tablet Take 1 tablet (200 mg total) by mouth daily. 09/23/18  Yes [name], MD  BIDIL 20-37.5 MG tablet TAKE TWO TABLETS BY MOUTH THREE TIMES DAILY Patient taking differently: Take 2 tablets by mouth 3 (three) times daily.  07/03/17  Yes [name], [name], MD  carvedilol (COREG) 12.5 MG tablet TAKE 1 TABLET BY MOUTH TWICE DAILY WITH A MEAL Patient taking differently: Take 12.5 mg by mouth 2 (two) times daily with a meal.  08/29/18  Yes [name], MD  diclofenac sodium (VOLTAREN) 1 % GEL Apply 4 g  topically 4 (four) times daily as needed. 03/04/18  Yes [name], MD  DULoxetine (CYMBALTA) 30 MG capsule TAKE 1 CAPSULE BY MOUTH ONCE DAILY Patient taking differently: Take 30 mg by mouth daily.  10/01/18  Yes [name], MD  furosemide (LASIX) 40 MG tablet Take 1 tablet (40 mg total) by mouth daily. 09/23/18  Yes [name], MD  levETIRAcetam (KEPPRA) 1000 MG tablet Take 1 tablet (1,000 mg total) by mouth 2 (two) times daily. 05/26/18  Yes [name], MD  losartan (COZAAR) 25 MG tablet TAKE 1 TABLET BY MOUTH ONCE DAILY Patient taking differently: Take 25 mg by mouth daily.  02/26/18  Yes Larey Dresser, MD  nystatin (MYCOSTATIN) 100000 UNIT/ML suspension SWISH, GARGLE 5 TO 10 ML IN MOUTH, THEN SPIT OUT, EVERY 6 HOURS AS NEEDED Patient taking differently: Use as directed 5-10 mLs in the mouth or throat every 6 (six) hours.  10/22/18  Yes [name], MD  phenytoin (DILANTIN) 100 MG ER capsule Take 1 capsule (100 mg total) by mouth 2 (two)  times daily. 05/26/18  Yes [name], MD  SAVAYSA 60 MG TABS tablet TAKE 1 TABLET BY MOUTH ONCE DAILY Patient taking differently: Take 60 mg by mouth daily.  11/28/18  Yes Larey Dresser, MD  simvastatin (ZOCOR) 20 MG tablet Take 1 tablet (20 mg total) by mouth daily at 6 PM. 03/04/18  Yes [name], MD  spironolactone (ALDACTONE) 25 MG tablet TAKE 1 TABLET BY MOUTH ONCE DAILY Patient taking differently: Take 25 mg by mouth daily.  08/29/18  Yes [name], MD  sulfamethoxazole-trimethoprim (BACTRIM DS,SEPTRA DS) 800-160 MG tablet Take 1 tablet by mouth 2 (two) times daily. 11/26/18  Yes [name], MD  tamsulosin (FLOMAX) 0.4 MG CAPS capsule Take 1 capsule (0.4 mg total) by mouth daily. 03/04/18  Yes [name], MD  amiodarone (PACERONE) 200 MG tablet TAKE 1 TABLET BY MOUTH ONCE DAILY Patient not taking: Reported on 12/06/2018 11/28/18   Larey Dresser, MD  ferrous sulfate 325  (65 FE) MG tablet Take 1 tablet (325 mg total) 2 (two) times daily with a meal by mouth. Patient not taking: Reported on 12/06/2018 11/07/17   [name], MD  polyethylene glycol [facility] / Floria Raveling) packet Take 17 g by mouth daily as needed. Patient not taking: Reported on 12/06/2018 02/25/17   Hosie Poisson, MD  senna-docusate (SENOKOT-S) 8.6-50 MG tablet Take 2 tablets by mouth 2 (two) times daily. Patient not taking: Reported on 12/06/2018 02/25/17   Hosie Poisson, MD  Vitamin D, Ergocalciferol, (DRISDOL) 50000 units CAPS capsule Take 1 capsule (50,000 Units total) by mouth every 7 (seven) days. Patient not taking: Reported on 12/06/2018 11/28/17   [name], MD    Family History Family History  Problem Relation Age of Onset  . Arthritis Mother   . Heart disease Mother   . Heart attack Mother   . Other Father        smoker  . Hypertension Neg Hx        unknown  . Stroke Neg Hx        unknown    Social History Social History   Tobacco Use  . Smoking status: Former Smoker    Types: Pipe    Last attempt to quit: 09/21/2008    Years since quitting: 10.2  . Smokeless tobacco: Former Systems developer    Quit date: 09/21/2008  Substance Use Topics  . Alcohol use: No  . Drug use: No     Allergies   Patient has no known allergies.   Review of Systems Review of Systems  Constitutional: Negative for fever.  HENT: Negative for sore throat.   Respiratory: Negative for shortness of breath.   Cardiovascular: Negative for chest pain.  Gastrointestinal: Negative for abdominal pain and nausea.  Genitourinary: Positive for flank pain. Negative for dysuria and hematuria.  Musculoskeletal: Negative for back pain.  Skin: Negative for pallor and wound.  Neurological: Negative for light-headedness and headaches.  All other systems reviewed and are negative.    Physical Exam Updated Vital Signs BP 117/89   Pulse 79   Temp 97.7 F (36.5 C) (Temporal)   Resp 20   SpO2  (!) 76%   Physical Exam Vitals signs and nursing note reviewed.  Constitutional:      Appearance: Normal appearance.  HENT:     Head: Normocephalic and atraumatic.     Nose: Nose normal.     Mouth/Throat:     Mouth: Mucous membranes are dry.  Eyes:     Pupils: Pupils are equal, round, and reactive to light.  Neck:     Musculoskeletal: Normal range of motion and neck supple.  Cardiovascular:     Rate and Rhythm: Normal rate.     Pulses: Normal pulses.  Pulmonary:     Effort: Pulmonary effort is normal.     Breath sounds: Normal breath sounds. No wheezing, rhonchi or rales.  Abdominal:     General: Abdomen is flat. There is no distension.     Tenderness: There is no abdominal tenderness. There is no right CVA tenderness or left CVA tenderness.  Musculoskeletal:        General: No tenderness, deformity or signs of injury.  Skin:    General: Skin is warm and dry.  Neurological:     General: No focal deficit present.     Mental Status: He is alert and oriented to person, place, and time.      ED Treatments / Results  Labs (all labs ordered are listed, but only abnormal results are displayed) Labs Reviewed  CBC WITH DIFFERENTIAL/PLATELET - Abnormal; Notable for the following components:      Result Value   WBC 3.4 (*)    HCT 53.0 (*)    Neutro Abs 1.6 (*)    All other components within normal limits  COMPREHENSIVE METABOLIC PANEL - Abnormal; Notable for the following components:   Sodium 133 (*)    BUN 38 (*)    Creatinine, Ser 1.89 (*)    Calcium 8.8 (*)    Total Protein 8.3 (*)    GFR calc non Af Amer 35 (*)    GFR calc Af Amer 41 (*)    All other components within normal limits  URINALYSIS, ROUTINE W REFLEX MICROSCOPIC - Abnormal; Notable for the following components:   Leukocytes, UA TRACE (*)    Bacteria, UA RARE (*)    All other components within normal limits  URINE CULTURE  BRAIN NATRIURETIC PEPTIDE    EKG EKG Interpretation  Date/Time:  Saturday  December 06 2018 19:23:32 EST Ventricular Rate:  65 PR Interval:    QRS Duration: 124 QT Interval:  439 QTC Calculation: 457 R Axis:   -43 Text Interpretation:  Sinus rhythm Ventricular premature complex Probable left atrial enlargement Nonspecific IVCD with LAD Probable anteroseptal infarct, old Baseline wander in lead(s) V4 When compared to prior, t wave now upright in lead AVL No STEMI Confirmed by Antony Blackbird [phone]) on 12/06/2018 7:59:53 PM   Radiology No results found.  Procedures Procedures (including critical care time)  Medications Ordered in ED Medications - No data to display   Initial Impression / Assessment and Plan / ED Course  I have reviewed the triage vital signs and the nursing notes.  Pertinent labs & imaging results that were available during my care of the patient were reviewed by me and considered in my medical decision making (see chart for details).  Clinical Course as of Dec 11 1022  Sat Dec 06, 2018  2111 Improved from 2.21 yesterday  Creatinine(!): 1.89 [JS]  2112 Within normal limits   B Natriuretic Peptide: 36.4 [JS]    Clinical Course User Index [JS] Janeece Fitting, PA-C    Sent in by PCP Dr. Pricilla Holm who reports patient had AK I showed on his blood work yesterday.  He has had decreased intake and fluids but wife has persistently tried to give Gatorade along with ginger ale.  BC show slight decrease in white  blood cell count consistent with his previous visit, hematocrit is 53.  CMP shows a decrease in sodium at 133 this is slightly decreased from yesterday at 135.  Creatinine level today was 1.89 from previous yesterday which was 2.21 improved. BNP was within normal limits. DG chest xray pleural effusion, pneumothorax, or consolidation. UA showed trace of leukocytes, bacteria rare.  Is any urinary symptoms at this time.  Seeing as patient is not volume overloaded from CHF will provide him with a bolus of 500 ml in order to rehydrate  him.Patient has been seen by my attending Dr Sherry Ruffing, who agrees with management.    Final Clinical Impressions(s) / ED Diagnoses   Final diagnoses:  Dehydration  Elevated creatine kinase level    ED Discharge Orders    None       Janeece Fitting, PA-C 12/11/18 1024    Tegeler, Gwenyth Allegra, MD 12/11/18 [phone]

## 2018-12-06 NOTE — Discharge Instructions (Addendum)
Your creatine level today was 1.89 from yesterday at 2.21, we have given you a 500 ml bolus of fluids. The rest of your laboratory results was within normal limits. Please follow up with your primary care physician as needed. Please continue to hydrate with plenty of water and Gatorade.

## 2018-12-06 NOTE — ED Triage Notes (Signed)
Pt being seen by Dr. Sharlet Salina to come to ED for worsening kidney function.  .  Pt currently on antibiotics for cyst on right arm.

## 2018-12-07 LAB — URINE CULTURE: Culture: NO GROWTH

## 2018-12-25 ENCOUNTER — Ambulatory Visit (INDEPENDENT_AMBULATORY_CARE_PROVIDER_SITE_OTHER): Payer: Medicare Other

## 2018-12-25 DIAGNOSIS — I255 Ischemic cardiomyopathy: Secondary | ICD-10-CM | POA: Diagnosis not present

## 2018-12-25 LAB — CUP PACEART REMOTE DEVICE CHECK
Date Time Interrogation Session: 20200102190944
Implantable Lead Location: 753860
Implantable Lead Model: 351
Implantable Lead Serial Number: 10260064
Implantable Pulse Generator Implant Date: 20120413
MDC IDC LEAD IMPLANT DT: 20060927
Pulse Gen Serial Number: 60613417

## 2018-12-25 NOTE — Progress Notes (Signed)
Remote ICD transmission.   

## 2018-12-31 ENCOUNTER — Other Ambulatory Visit (HOSPITAL_COMMUNITY): Payer: Self-pay | Admitting: Cardiology

## 2019-01-06 ENCOUNTER — Other Ambulatory Visit: Payer: Self-pay

## 2019-01-06 ENCOUNTER — Ambulatory Visit: Payer: Medicare Other | Admitting: Neurology

## 2019-01-06 ENCOUNTER — Encounter: Payer: Self-pay | Admitting: Neurology

## 2019-01-06 ENCOUNTER — Other Ambulatory Visit (HOSPITAL_COMMUNITY): Payer: Self-pay

## 2019-01-06 VITALS — BP 100/66 | HR 82 | Ht 71.0 in | Wt 214.0 lb

## 2019-01-06 DIAGNOSIS — G40209 Localization-related (focal) (partial) symptomatic epilepsy and epileptic syndromes with complex partial seizures, not intractable, without status epilepticus: Secondary | ICD-10-CM | POA: Diagnosis not present

## 2019-01-06 DIAGNOSIS — F03A0 Unspecified dementia, mild, without behavioral disturbance, psychotic disturbance, mood disturbance, and anxiety: Secondary | ICD-10-CM

## 2019-01-06 DIAGNOSIS — F0390 Unspecified dementia without behavioral disturbance: Secondary | ICD-10-CM

## 2019-01-06 MED ORDER — DIVALPROEX SODIUM ER 500 MG PO TB24: ORAL_TABLET | ORAL | 11 refills | Status: DC

## 2019-01-06 MED ORDER — LEVETIRACETAM 1000 MG PO TABS: 1000.0000 mg | ORAL_TABLET | Freq: Two times a day (BID) | ORAL | 11 refills | Status: DC

## 2019-01-06 MED ORDER — PHENYTOIN SODIUM EXTENDED 100 MG PO CAPS: 100.0000 mg | ORAL_CAPSULE | Freq: Two times a day (BID) | ORAL | 11 refills | Status: DC

## 2019-01-06 NOTE — Progress Notes (Signed)
NEUROLOGY FOLLOW UP OFFICE NOTE  Louis Ford. [id number] Jun 23, 1946  HISTORY OF PRESENT ILLNESS: I had the pleasure of seeing Louis Ford in follow-up in the neurology clinic on 01/06/2019.  The patient was last seen 7 months ago for seizures. He is alone in the office today. His wife was in the waiting room, I spoke to her separately. He denies any seizures since his last visit. His wife reports at least 4 seizures where he is humming and unresponsive, last seizure was last month. She manages his medications and denies missing doses. He is on Keppra 1000mg  BID and Dilantin 100mg  BID. He is not having the hallucinations like before, but he is more agitated and irritable. He gets upset with his wife and calls her a Physiological scientist. She states he is a hypochondriac, when she was in the hospital, he was not feeling well, as she started to get better, he also started to get better. He keeps complaining about pain from his defibrillator. He is also reporting pain at the base of his left thumb with numbness, no weakness, bilateral knee pain. He had a headache yesterday and took 2 Tylenol. He denies any dizziness but is fearful he will fall. No falls. He denies any olfactory/gustatory hallucinations, focal weakness, no myoclonic jerks.   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 He moved to [place] 3 years ago and comes today with his wife. He reports seizures started in his early 39s while he was living in [place]. He also reports being told he had a stroke but denied any deficits from it. He has had several hospital/ER visits to [facility] since 2013, records were reviewed. He denies any prior warning to the seizures. He has been married to his wife for a year, she has only seen 2 seizures, when he was admitted last  September 2018, and one a week ago at 1am. She describes humming, unresponsiveness, body stiff but no convulsive activity. Records from prior ER visits from 2013 to 2015 indicate staring spells and generalized tonic-clonic seizures. The last ER visit for seizure prior to September 2018 was in October 2016. He was previously on Keppra and Dilantin at some point in [place], and when he moved here and had the seizures initially, he was not taking seizure medication. Dilantin appears to have been prescribed from the ER at some point. He has been taking Dilantin 100mg  BID and denied missing medication when he was admitted for breakthrough seizures last 09/08/17. His wife reports he had 2 seizures where he was humming and unresponsive for a few minutes. In the ER, he had 2 witnessed complex partial seizures with note of diaphoresis, humming, followed by staring off, lip smacking, and picking objects. This lasted 5 minutes, he received 2 doses of Ativan. His awake and drowsy EEG showed diffuse beta activity, no epileptiform discharges. His head CT did not show any acute changes, there was chronic stable microvascular disease. He had a head CT in October 2013 with note of encephalomalacia in the medial left temporal lobe, this is slightly visible on last brain imaging. His Dilantin level was 10, he was discharged home on higher dose Dilantin 300mg  qhs and Keppra 750mg  BID. He denies any side effects to the medications. His wife administers medications. She reports one more seizures since hospitalization that occurred a week  ago at 1am while in bed, he became diaphoretic, said it was hot and turned the fan on, then started humming for a few minutes.   He denies any dizziness, diplopia, dysarthria/dysphagia, neck/back pain, bowel/bladder dysfunction. He has a little shakiness in both hands. He denies any gaps in time, olfactory/gustatory hallucinations, deja vu, rising epigastric sensation, focal  numbness/tingling/weakness, myoclonic jerks. He reports waking up today feeling a little left-sided chest pain and warm feeling in his left arm. He reports a sensation in his head like there is water going from side to side, he has occasional left-sided head pain. He does not sleep well, he is up at night watching TV, goes to bed from 4am to 10am. He denies any alcohol use. He feels his memory is good. His wife has taken over medications since his hospital stay, he would take his medication in the pillbox and want to fill it back up and take again, or sometimes he would not take all the medications in the box.   Epilepsy Risk Factors:  History of stroke. There is medial left temporal encephalomalacia on head CT. Otherwise he had a normal birth and early development.  There is no history of febrile convulsions, CNS infections such as meningitis/encephalitis, significant traumatic brain injury, neurosurgical procedures, or family history of seizures.  PAST MEDICAL HISTORY: Past Medical History:  Diagnosis Date  . AICD (automatic cardioverter/defibrillator) present   . Atrial fibrillation   09/22/2012  . Blood loss anemia 04/18/2017   After GI bleed from colonoscopy and polypectomy  . CAD (coronary artery disease)   . Chronic systolic heart failure ([place])   . Factor VII deficiency ([place]) 05/2011  . 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 GIB (gastrointestinal bleeding) 02/20/2017  . History of MRSA infection 05/2011  . HTN (hypertension)   . Hx of adenomatous colonic polyps 02/20/2017   01/2017 - 3 cm sigmoid TV adenoma and other smaller polyps - had post-polypectomy bleed Tx w/ clips Consider repeat colonoscopy 3 yrs [name], MD, [name]   . Hyperlipidemia   . implantable cardiac defibrillator-Biotronik    Device Implanted 2006; s/p gen change 03/2011 : bleeding persistent with pocket erosion and infection; explant and reimplant  06/2011  . Ischemic cardiomyopathy    EF 15 to 20% by TTE and  TEE in 09/2012.  Severe LV dysfunction  . Obesity    BMI 31 in 09/2012  . Persistent atrial fibrillation with rapid ventricular response 04/21/2014  . Seizure disorder ([place]) latest 09/30/2012  . Seizures ([place])   . Stroke [facility])     MEDICATIONS: Current Outpatient Medications on File Prior to Visit  Medication Sig Dispense Refill  . acetaminophen (TYLENOL) 325 MG tablet Take 650 mg by mouth every 6 (six) hours as needed for mild pain.    [name] amiodarone (PACERONE) 200 MG tablet Take 1 tablet (200 mg total) by mouth daily. 30 tablet 3  . amiodarone (PACERONE) 200 MG tablet TAKE 1 TABLET BY MOUTH ONCE DAILY (Patient not taking: Reported on 12/06/2018) 30 tablet 0  . carvedilol (COREG) 12.5 MG tablet TAKE 1 TABLET BY MOUTH TWICE DAILY WITH A MEAL (Patient taking differently: Take 12.5 mg by mouth 2 (two) times daily with a meal. ) 180 tablet 1  . diclofenac sodium (VOLTAREN) 1 % GEL Apply 4 g topically 4 (four) times daily as needed. 100 g 5  . DULoxetine (CYMBALTA) 30 MG capsule TAKE 1 CAPSULE BY MOUTH ONCE DAILY (Patient taking differently: Take 30 mg by  mouth daily. ) 30 capsule 5  . ferrous sulfate 325 (65 FE) MG tablet Take 1 tablet (325 mg total) 2 (two) times daily with a meal by mouth. (Patient not taking: Reported on 12/06/2018) 60 tablet 3  . furosemide (LASIX) 40 MG tablet Take 1 tablet (40 mg total) by mouth daily. 90 tablet 3  . isosorbide-hydrALAZINE (BIDIL) 20-37.5 MG tablet Take 2 tablets by mouth 3 (three) times daily. 450 tablet 0  . levETIRAcetam (KEPPRA) 1000 MG tablet Take 1 tablet (1,000 mg total) by mouth 2 (two) times daily. 60 tablet 11  . losartan (COZAAR) 25 MG tablet TAKE 1 TABLET BY MOUTH ONCE DAILY (Patient taking differently: Take 25 mg by mouth daily. ) 90 tablet 3  . nystatin (MYCOSTATIN) 100000 UNIT/ML suspension SWISH, GARGLE 5 TO 10 ML IN MOUTH, THEN SPIT OUT, EVERY 6 HOURS AS NEEDED (Patient taking differently: Use as directed 5-10 mLs in the mouth or throat every 6  (six) hours. ) 240 mL 11  . phenytoin (DILANTIN) 100 MG ER capsule Take 1 capsule (100 mg total) by mouth 2 (two) times daily. 60 capsule 11  . polyethylene glycol (MIRALAX / GLYCOLAX) packet Take 17 g by mouth daily as needed. (Patient not taking: Reported on 12/06/2018) 14 each 0  . SAVAYSA 60 MG TABS tablet TAKE 1 TABLET BY MOUTH ONCE DAILY (Patient taking differently: Take 60 mg by mouth daily. ) 30 tablet 0  . senna-docusate (SENOKOT-S) 8.6-50 MG tablet Take 2 tablets by mouth 2 (two) times daily. (Patient not taking: Reported on 12/06/2018) 10 tablet 0  . simvastatin (ZOCOR) 20 MG tablet Take 1 tablet (20 mg total) by mouth daily at 6 PM. 90 tablet 3  . spironolactone (ALDACTONE) 25 MG tablet TAKE 1 TABLET BY MOUTH ONCE DAILY (Patient taking differently: Take 25 mg by mouth daily. ) 90 tablet 1  . sulfamethoxazole-trimethoprim (BACTRIM DS,SEPTRA DS) 800-160 MG tablet Take 1 tablet by mouth 2 (two) times daily. 14 tablet 0  . tamsulosin (FLOMAX) 0.4 MG CAPS capsule Take 1 capsule (0.4 mg total) by mouth daily. 90 capsule 3  . Vitamin D, Ergocalciferol, (DRISDOL) 50000 units CAPS capsule Take 1 capsule (50,000 Units total) by mouth every 7 (seven) days. (Patient not taking: Reported on 12/06/2018) 12 capsule 0   No current facility-administered medications on file prior to visit.     ALLERGIES: No Known Allergies  FAMILY HISTORY: Family History  Problem Relation Age of Onset  . Arthritis Mother   . Heart disease Mother   . Heart attack Mother   . Other Father        smoker  . Hypertension Neg Hx        unknown  . Stroke Neg Hx        unknown    SOCIAL HISTORY: Social History   Socioeconomic History  . Marital status: Married    Spouse name: Not on file  . Number of children: Not on file  . Years of education: Not on file  . Highest education level: Not on file  Occupational History  . Not on file  Social Needs  . Financial resource strain: Not on file  . Food  insecurity:    Worry: Not on file    Inability: Not on file  . Transportation needs:    Medical: Not on file    Non-medical: Not on file  Tobacco Use  . Smoking status: Former Smoker    Types: Pipe    Last attempt  to quit: 09/21/2008    Years since quitting: 10.2  . Smokeless tobacco: Former Systems developer    Quit date: 09/21/2008  Substance and Sexual Activity  . Alcohol use: No  . Drug use: No  . Sexual activity: Never    Birth control/protection: None  Lifestyle  . Physical activity:    Days per week: Not on file    Minutes per session: Not on file  . Stress: Not on file  Relationships  . Social connections:    Talks on phone: Not on file    Gets together: Not on file    Attends religious service: Not on file    Active member of club or organization: Not on file    Attends meetings of clubs or organizations: Not on file    Relationship status: Not on file  . Intimate partner violence:    Fear of current or ex partner: Not on file    Emotionally abused: Not on file    Physically abused: Not on file    Forced sexual activity: Not on file  Other Topics Concern  . Not on file  Social History Narrative   ** Merged History Encounter **        REVIEW OF SYSTEMS: Constitutional: No fevers, chills, or sweats, no generalized fatigue, change in appetite Eyes: No visual changes, double vision, eye pain Ear, nose and throat: No hearing loss, ear pain, nasal congestion, sore throat Cardiovascular: No chest pain, palpitations Respiratory:  No shortness of breath at rest or with exertion, wheezes GastrointestinaI: No nausea, vomiting, diarrhea, abdominal pain, fecal incontinence Genitourinary:  No dysuria, urinary retention or frequency Musculoskeletal:  No neck pain, back pain Integumentary: No rash, pruritus, skin lesions Neurological: as above Psychiatric: No depression, +insomnia, anxiety Endocrine: No palpitations, fatigue, diaphoresis, mood swings, change in appetite, change in  weight, increased thirst Hematologic/Lymphatic:  No anemia, purpura, petechiae. Allergic/Immunologic: no itchy/runny eyes, nasal congestion, recent allergic reactions, rashes  PHYSICAL EXAM: Vitals:   01/06/19 1453  BP: 100/66  Pulse: 82  SpO2: 96%   General: No acute distress Head:  Normocephalic, atraumatic Neck: supple, no paraspinal tenderness, full range of motion Heart:  Regular rate and rhythm Lungs:  Clear to auscultation bilaterally Back: No paraspinal tenderness Skin/Extremities: No rash, no edema Neurological Exam: alert and oriented to person, city/state, date/day of week/season. No aphasia or dysarthria. Fund of knowledge is reduced.  Recent and remote memory are impaired.  Attention and concentration are normal.    Able to name objects and repeat phrases.  MMSE - Mini Mental State Exam 01/06/2019  Orientation to time 3  Orientation to Place 3  Registration 3  Attention/ Calculation 2  Recall 2  Language- name 2 objects 2  Language- repeat 0  Language- follow 3 step command 3  Language- read & follow direction 1  Write a sentence 1  Copy design 0  Total score 20   Cranial nerves: Pupils equal, round, reactive to light. Extraocular movements intact with no nystagmus. Visual fields full. Facial sensation intact. No facial asymmetry. Tongue, uvula, palate midline.  Motor: Bulk and tone normal, muscle strength 5/5 throughout with no pronator drift.  Sensation to light touch intact.  No extinction to double simultaneous stimulation. Finger to nose testing intact.  Gait much improved, slightly wide-based, no ataxia.  Romberg negative. 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 Head CT showed medial left temporal encephalomalacia. Routine EEG unremarkable. Since his last visit, his wife reports 4 seizures with humming and unresponsiveness, he is amnestic of them. His wife is also reporting more cognitive and behavioral changes, MMSE today 20/30 indicating mild dementia with behavioral disturbance. His wife manages medications. We discussed the option of increasing Keppra dose, however she reports that he is already very irritable and easily agitated. We agreed to add on Depakote ER 500mg  qhs for seizure prophylaxis and mood stabilization, continue Keppra 1000mg  BID and Dilantin 100mg  BID. He does not drive. Continue 24/7 supervision. Follow-up in 6 months, his wife knows to call for any changes.   Thank you for allowing me to participate in his care.  Please do not hesitate to call for any questions or concerns.  The duration of this appointment visit was 30 minutes of face-to-face time with the patient.  Greater than 50% of this time was spent in counseling, explanation of diagnosis, planning of further management, and coordination of care.   Ellouise Newer, M.D.   CC: Dr. Sharlet Salina

## 2019-01-06 NOTE — Patient Instructions (Addendum)
1. Start Depakote ER 500mg : Take 1 tablet every night 2. Continue Keppra (Levetiracetam) 1000mg  twice a day 3. Continue Dilantin 100mg  twice a day 4. Follow-up in 6 months, call for any changes  Seizure Precautions: 1. If medication has been prescribed for you to prevent seizures, take it exactly as directed.  Do not stop taking the medicine without talking to your doctor first, even if you have not had a seizure in a long time.   2. Avoid activities in which a seizure would cause danger to yourself or to others.  Don't operate dangerous machinery, swim alone, or climb in high or dangerous places, such as on ladders, roofs, or girders.  Do not drive unless your doctor says you may.  3. If you have any warning that you may have a seizure, lay down in a safe place where you can't hurt yourself.    4.  No driving for 6 months from last seizure, as per [facility].   Please refer to the following link on the [place] website for more information: [email or web address]   5.  Maintain good sleep hygiene. Avoid alcohol.  6.  Contact your doctor if you have any problems that may be related to the medicine you are taking.  7.  Call 911 and bring the patient back to the ED if:        A.  The seizure lasts longer than 5 minutes.       B.  The patient doesn't awaken shortly after the seizure  C.  The patient has new problems such as difficulty seeing, speaking or moving  D.  The patient was injured during the seizure  E.  The patient has a temperature over 102 F (39C)  F.  The patient vomited and now is having trouble breathing

## 2019-01-13 ENCOUNTER — Other Ambulatory Visit: Payer: Self-pay

## 2019-01-13 MED ORDER — EDOXABAN TOSYLATE 60 MG PO TABS: 60.0000 mg | ORAL_TABLET | Freq: Every day | ORAL | 0 refills | Status: DC

## 2019-01-16 ENCOUNTER — Other Ambulatory Visit (HOSPITAL_COMMUNITY): Payer: Self-pay

## 2019-01-16 MED ORDER — EDOXABAN TOSYLATE 60 MG PO TABS: 60.0000 mg | ORAL_TABLET | Freq: Every day | ORAL | 0 refills | Status: DC

## 2019-01-29 ENCOUNTER — Encounter (HOSPITAL_COMMUNITY): Payer: Self-pay

## 2019-01-29 ENCOUNTER — Ambulatory Visit (HOSPITAL_COMMUNITY)
Admission: RE | Admit: 2019-01-29 | Discharge: 2019-01-29 | Disposition: A | Discharge status: Home / Self Care | Payer: Medicare Other | Source: Ambulatory Visit | Attending: Internal Medicine | Admitting: Internal Medicine

## 2019-01-29 VITALS — BP 120/90 | HR 77 | Wt 218.0 lb

## 2019-01-29 DIAGNOSIS — G40909 Epilepsy, unspecified, not intractable, without status epilepticus: Secondary | ICD-10-CM | POA: Diagnosis not present

## 2019-01-29 DIAGNOSIS — I480 Paroxysmal atrial fibrillation: Secondary | ICD-10-CM

## 2019-01-29 DIAGNOSIS — Z8249 Family history of ischemic heart disease and other diseases of the circulatory system: Secondary | ICD-10-CM | POA: Diagnosis not present

## 2019-01-29 DIAGNOSIS — Z95810 Presence of automatic (implantable) cardiac defibrillator: Secondary | ICD-10-CM

## 2019-01-29 DIAGNOSIS — Z87891 Personal history of nicotine dependence: Secondary | ICD-10-CM | POA: Insufficient documentation

## 2019-01-29 DIAGNOSIS — Z951 Presence of aortocoronary bypass graft: Secondary | ICD-10-CM | POA: Insufficient documentation

## 2019-01-29 DIAGNOSIS — Z7901 Long term (current) use of anticoagulants: Secondary | ICD-10-CM | POA: Diagnosis not present

## 2019-01-29 DIAGNOSIS — Z79899 Other long term (current) drug therapy: Secondary | ICD-10-CM | POA: Diagnosis not present

## 2019-01-29 DIAGNOSIS — E785 Hyperlipidemia, unspecified: Secondary | ICD-10-CM | POA: Diagnosis not present

## 2019-01-29 DIAGNOSIS — N183 Chronic kidney disease, stage 3 unspecified: Secondary | ICD-10-CM

## 2019-01-29 DIAGNOSIS — Z8614 Personal history of Methicillin resistant Staphylococcus aureus infection: Secondary | ICD-10-CM | POA: Diagnosis not present

## 2019-01-29 DIAGNOSIS — I255 Ischemic cardiomyopathy: Secondary | ICD-10-CM | POA: Diagnosis not present

## 2019-01-29 DIAGNOSIS — F819 Developmental disorder of scholastic skills, unspecified: Secondary | ICD-10-CM | POA: Diagnosis not present

## 2019-01-29 DIAGNOSIS — M109 Gout, unspecified: Secondary | ICD-10-CM | POA: Diagnosis not present

## 2019-01-29 DIAGNOSIS — I2510 Atherosclerotic heart disease of native coronary artery without angina pectoris: Secondary | ICD-10-CM | POA: Diagnosis not present

## 2019-01-29 DIAGNOSIS — I130 Hypertensive heart and chronic kidney disease with heart failure and stage 1 through stage 4 chronic kidney disease, or unspecified chronic kidney disease: Secondary | ICD-10-CM | POA: Insufficient documentation

## 2019-01-29 DIAGNOSIS — I5022 Chronic systolic (congestive) heart failure: Secondary | ICD-10-CM | POA: Diagnosis not present

## 2019-01-29 DIAGNOSIS — Z8673 Personal history of transient ischemic attack (TIA), and cerebral infarction without residual deficits: Secondary | ICD-10-CM | POA: Insufficient documentation

## 2019-01-29 DIAGNOSIS — D682 Hereditary deficiency of other clotting factors: Secondary | ICD-10-CM | POA: Insufficient documentation

## 2019-01-29 LAB — BASIC METABOLIC PANEL
Anion gap: 12 (ref 5–15)
BUN: 37 mg/dL — ABNORMAL HIGH (ref 8–23)
CALCIUM: 8.9 mg/dL (ref 8.9–10.3)
CO2: 25 mmol/L (ref 22–32)
Chloride: 101 mmol/L (ref 98–111)
Creatinine, Ser: 1.65 mg/dL — ABNORMAL HIGH (ref 0.61–1.24)
GFR calc Af Amer: 48 mL/min — ABNORMAL LOW (ref >60)
GFR calc non Af Amer: 41 mL/min — ABNORMAL LOW (ref >60)
Glucose, Bld: 106 mg/dL — ABNORMAL HIGH (ref 70–99)
Potassium: 5.5 mmol/L — ABNORMAL HIGH (ref 3.5–5.1)
Sodium: 138 mmol/L (ref 135–145)

## 2019-01-29 MED ORDER — ISOSORBIDE MONONITRATE ER 30 MG PO TB24: 30.0000 mg | ORAL_TABLET | Freq: Every day | ORAL | 3 refills | Status: DC

## 2019-01-29 MED ORDER — HYDRALAZINE HCL 25 MG PO TABS: 25.0000 mg | ORAL_TABLET | Freq: Three times a day (TID) | ORAL | 3 refills | Status: DC

## 2019-01-29 NOTE — Progress Notes (Signed)
Patient ID: Louis Ford., male   DOB: March 25, 1946, 73 y.o.   MRN: [id number]    Advanced Heart Failure Clinic Note   Primary Care: Louis Ford Primary Cardiologist: Louis Ford Primary HF: Louis Ford  HPI: [facility] Louis Ford. is a 73 y.o. male with hx of CAD s/p CABG, ICM s/p Biotronik ICD, chronic systolic CHF Echo 22/02/54 LVEF 30-35% -> improved to 55-60% (3/17),HTN, HLD, atrial fibrillation, previous stroke, Factor 7 deficiency, and seizure disorder.  Admitted from [facility] in 10/16 with ADHF in setting of AF with RVR. He diuresed well initially on IV lasix 40 mg BID, but continued to have SOB and was thus on milrinoneon 10/14 for concerns of low output. RHC on 10/12/15 that showed depressed cardiac output with Fick CO/CI 3.6/1.6. Improved on milrinone and remained stable with wean. Bidil started and increased to goal dose.Also started on amio for afib rate control. D/c weight 229 lbs  Admitted 02/26/16 after two near syncopal episodes. ICD interrogation negative for any acute events. Suspect related to overdiuresis and hypotension with recurrent Afib.  Spontaneously converted to NSR without intervention. Echo EF 55-60%   Myoview done for CP 11/17 Nuclear stress EF: 39%. Inferior wall akinesis. Dilated left ventricle. Defect 1: There is a small defect of mild severity present in the apex location.  Admitted 2/18 with LGIB after polypectomy found to have bleeding from the stalk and was clipped.   Admitted 12/28 => 12/28/17. Pt had UTI that exacerbated his PAF leading to Afib RVR. Underwent DCCV with transition to NSR. Amiodarone loaded.   Today he returns for HF follow up. Overall feeling fine. Denies SOB/PND/Orthopnea. Appetite ok. No fever or chills. He has not bein weighing at home. Having a hard time paying for bidil. [place] has limited funds and will only beable to pay for 1/2 of the bidil cost. Taking all medications. Lives with his wife.   RHC 10/12/15 RA = 7 RV  = 28/1/8 PA = 32/15 (20) PCW = 12 Fick cardiac output/index = 3.6/1.6 Thermo cardiac output/index = 3.5/1.5 PVR = 2.2 WU PA sat = 44%, 47% Ao sat = 98%  Echo 10/05/15 LVEF 30-35%, Mild MR, PA peak pressure 34 mmHg Echo 02/27/16 LVEF 55-60%, Grade 2 DD dysfunction. LAE mildly dilated, PA peak pressure 35 mm Hg.  Myoview 11/17 EF 39%  Labs: 12/30/15 K 5.0, creatinine 1.3 Labs: 02/29/16 K 4.7, creatinine 1.27 Labs: 03/25/2016: K 4.3 Creatinine 1.31 Labs: Creatinine 1.89   Past Medical History:  Diagnosis Date  . AICD (automatic cardioverter/defibrillator) present   . Atrial fibrillation   09/22/2012  . Blood loss anemia 04/18/2017   After GI bleed from colonoscopy and polypectomy  . CAD (coronary artery disease)   . Chronic systolic heart failure ([place])   . 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 GIB (gastrointestinal bleeding) 02/20/2017  . History of MRSA infection 05/2011  . HTN (hypertension)   . Hx of adenomatous colonic polyps 02/20/2017   01/2017 - 3 cm sigmoid TV adenoma and other smaller polyps - had post-polypectomy bleed Tx w/ clips Consider repeat colonoscopy 3 yrs Louis Mayer, MD, Louis Ford   . Hyperlipidemia   . implantable cardiac defibrillator-Biotronik    Device Implanted 2006; s/p gen change 03/2011 : bleeding persistent with pocket erosion and infection; explant and reimplant  06/2011  . Ischemic cardiomyopathy    EF 15 to 20% by TTE and TEE in 09/2012.  Severe LV dysfunction  .  Obesity    BMI 31 in 09/2012  . Persistent atrial fibrillation with rapid ventricular response 04/21/2014  . Seizure disorder ([place]) latest 09/30/2012  . Seizures ([place])   . Stroke [facility])     Current Outpatient Medications  Medication Sig Dispense Refill  . acetaminophen (TYLENOL) 325 MG tablet Take 650 mg by mouth every 6 (six) hours as needed for mild pain.    [name] amiodarone (PACERONE) 200 MG tablet Take 1 tablet (200 mg total) by mouth daily. 30 tablet 3    . carvedilol (COREG) 12.5 MG tablet TAKE 1 TABLET BY MOUTH TWICE DAILY WITH A MEAL (Patient taking differently: Take 12.5 mg by mouth 2 (two) times daily with a meal. ) 180 tablet 1  . divalproex (DEPAKOTE ER) 500 MG 24 hr tablet Take 1 tablet every night 30 tablet 11  . DULoxetine (CYMBALTA) 30 MG capsule TAKE 1 CAPSULE BY MOUTH ONCE DAILY (Patient taking differently: Take 30 mg by mouth daily. ) 30 capsule 5  . edoxaban (SAVAYSA) 60 MG TABS tablet Take 60 mg by mouth daily. Please schedule appt for future refills. 1st attempt 30 tablet 0  . furosemide (LASIX) 40 MG tablet Take 1 tablet (40 mg total) by mouth daily. 90 tablet 3  . isosorbide-hydrALAZINE (BIDIL) 20-37.5 MG tablet Take 2 tablets by mouth 3 (three) times daily. 450 tablet 0  . levETIRAcetam (KEPPRA) 1000 MG tablet Take 1 tablet (1,000 mg total) by mouth 2 (two) times daily. 60 tablet 11  . losartan (COZAAR) 25 MG tablet TAKE 1 TABLET BY MOUTH ONCE DAILY (Patient taking differently: Take 25 mg by mouth daily. ) 90 tablet 3  . nystatin (MYCOSTATIN) 100000 UNIT/ML suspension SWISH, GARGLE 5 TO 10 ML IN MOUTH, THEN SPIT OUT, EVERY 6 HOURS AS NEEDED (Patient taking differently: Use as directed 5-10 mLs in the mouth or throat every 6 (six) hours. ) 240 mL 11  . phenytoin (DILANTIN) 100 MG ER capsule Take 1 capsule (100 mg total) by mouth 2 (two) times daily. 60 capsule 11  . polyethylene glycol (MIRALAX / GLYCOLAX) packet Take 17 g by mouth daily as needed. 14 each 0  . senna-docusate (SENOKOT-S) 8.6-50 MG tablet Take 2 tablets by mouth 2 (two) times daily. 10 tablet 0  . simvastatin (ZOCOR) 20 MG tablet Take 1 tablet (20 mg total) by mouth daily at 6 PM. 90 tablet 3  . spironolactone (ALDACTONE) 25 MG tablet TAKE 1 TABLET BY MOUTH ONCE DAILY (Patient taking differently: Take 25 mg by mouth daily. ) 90 tablet 1  . tamsulosin (FLOMAX) 0.4 MG CAPS capsule Take 1 capsule (0.4 mg total) by mouth daily. 90 capsule 3  . Vitamin D, Ergocalciferol,  (DRISDOL) 50000 units CAPS capsule Take 1 capsule (50,000 Units total) by mouth every 7 (seven) days. 12 capsule 0   No current facility-administered medications for this encounter.     No Known Allergies    Social History   Socioeconomic History  . Marital status: Married    Spouse name: Not on file  . Number of children: Not on file  . Years of education: Not on file  . Highest education level: Not on file  Occupational History  . Not on file  Social Needs  . Financial resource strain: Not on file  . Food insecurity:    Worry: Not on file    Inability: Not on file  . Transportation needs:    Medical: Not on file    Non-medical: Not on file  Tobacco Use  . Smoking status: Former Smoker    Types: Pipe    Last attempt to quit: 09/21/2008    Years since quitting: 10.3  . Smokeless tobacco: Former Systems developer    Quit date: 09/21/2008  Substance and Sexual Activity  . Alcohol use: No  . Drug use: No  . Sexual activity: Never    Birth control/protection: None  Lifestyle  . Physical activity:    Days per week: Not on file    Minutes per session: Not on file  . Stress: Not on file  Relationships  . Social connections:    Talks on phone: Not on file    Gets together: Not on file    Attends religious service: Not on file    Active member of club or organization: Not on file    Attends meetings of clubs or organizations: Not on file    Relationship status: Not on file  . Intimate partner violence:    Fear of current or ex partner: Not on file    Emotionally abused: Not on file    Physically abused: Not on file    Forced sexual activity: Not on file  Other Topics Concern  . Not on file  Social History Narrative   ** Merged History Encounter **          Family History  Problem Relation Age of Onset  . Arthritis Mother   . Heart disease Mother   . Heart attack Mother   . Other Father        smoker  . Hypertension Neg Hx        unknown  . Stroke Neg Hx         unknown    Vitals:   01/29/19 1106  BP: 120/90  Pulse: 77  SpO2: 98%  Weight: 98.9 kg (218 lb)     Wt Readings from Last 3 Encounters:  01/29/19 98.9 kg (218 lb)  01/06/19 97.1 kg (214 lb)  11/26/18 93.9 kg (207 lb)     PHYSICAL EXAM: General:  Well appearing. No resp difficulty HEENT: normal Neck: supple. no JVD. Carotids 2+ bilat; no bruits. No lymphadenopathy or thryomegaly appreciated. Cor: PMI nondisplaced. Regular rate & rhythm. No rubs, gallops or murmurs. Lungs: clear Abdomen: soft, nontender, nondistended. No hepatosplenomegaly. No bruits or masses. Good bowel sounds. Extremities: no cyanosis, clubbing, rash, edema Neuro: alert & orientedx3, cranial nerves grossly intact. moves all 4 extremities w/o difficulty. Affect pleasant  EKG NSR 77 bpm   ASSESSMENT & PLAN:  1. Chronic Systolic CHF, Echo 03/29/49 Echo improved to 55-60% from echo 10/05/15 LVEF 30-35% - Myoview 11/17 EF 39% - Echo 03/2017 LVEF 50-55%. ECHO next visit.  - Volume status stable. Continue Continue lasix 40 mg daily.  - Continue carvedilol 3.125 mg BID.  - Continue spironolactone 25 mg daily.    - Continue losartan 25 mg daily. No outside of range for Entresto.  - Stop bidil. Start hydralazine 25 mg three times a day and add 30 mg imdur daily.   BMET  2. PAF  -Maintaining NSR.  - Continue amiodarone 200 mg daily. (Finished taper).  - Anticoagulated on Syvasa 60 mg daily. Recent CBC stable 12/06/2018 3. CAD s/p CABG - Myoview 10/2016 with no significant ischemia - No s/s ischemia   - Continue medical therapy.  4. HTN - Stable.  5. CKD stage III Check BMEt  6. Gout - On allopurinol 200 mg daily. Per PCP.  7. Learning disability  -  Previously paramedicine patient but graduated.   Follow up in 6 months with Louis Haroldine Ford and ECHO. If EF remains stable then may be able to send to general cardiology.  Check BMET today. Maybe able to restart bidil in the fall because it will be generic.   Darrick Grinder, NP  11:11 AM

## 2019-01-29 NOTE — Patient Instructions (Signed)
STOP Bidil START Imdur 30 mg, one tab daily START Hydralazine 25 mg, one tab three times daily  Labs today We will only contact you if something comes back abnormal or we need to make some changes. Otherwise no news is good news!  Your physician recommends that you schedule a follow-up appointment in: 6 months with Dr Haroldine Laws and echo  Your physician has requested that you have an echocardiogram. Echocardiography is a painless test that uses sound waves to create images of your heart. It provides your doctor with information about the size and shape of your heart and how well your heart's chambers and valves are working. This procedure takes approximately one hour. There are no restrictions for this procedure.   Do the following things EVERYDAY: 1) Weigh yourself in the morning before breakfast. Write it down and keep it in a log. 2) Take your medicines as prescribed 3) Eat low salt foods-Limit salt (sodium) to 2000 mg per day.  4) Stay as active as you can everyday 5) Limit all fluids for the day to less than 2 liters .3

## 2019-02-05 ENCOUNTER — Other Ambulatory Visit: Payer: Self-pay | Admitting: Internal Medicine

## 2019-02-09 ENCOUNTER — Encounter (HOSPITAL_COMMUNITY): Payer: Self-pay | Admitting: Cardiology

## 2019-02-09 NOTE — Progress Notes (Signed)
Abnormal lab letter mailed  

## 2019-02-17 ENCOUNTER — Ambulatory Visit (HOSPITAL_COMMUNITY)
Admission: RE | Admit: 2019-02-17 | Discharge: 2019-02-17 | Disposition: A | Discharge status: Home / Self Care | Payer: Medicare Other | Source: Ambulatory Visit | Attending: Internal Medicine | Admitting: Internal Medicine

## 2019-02-17 DIAGNOSIS — I5022 Chronic systolic (congestive) heart failure: Secondary | ICD-10-CM | POA: Diagnosis not present

## 2019-02-17 LAB — BASIC METABOLIC PANEL
ANION GAP: 9 (ref 5–15)
BUN: 39 mg/dL — ABNORMAL HIGH (ref 8–23)
CO2: 24 mmol/L (ref 22–32)
Calcium: 8.8 mg/dL — ABNORMAL LOW (ref 8.9–10.3)
Chloride: 100 mmol/L (ref 98–111)
Creatinine, Ser: 1.58 mg/dL — ABNORMAL HIGH (ref 0.61–1.24)
GFR calc Af Amer: 51 mL/min — ABNORMAL LOW (ref >60)
GFR calc non Af Amer: 44 mL/min — ABNORMAL LOW (ref >60)
Glucose, Bld: 118 mg/dL — ABNORMAL HIGH (ref 70–99)
Potassium: 4.2 mmol/L (ref 3.5–5.1)
Sodium: 133 mmol/L — ABNORMAL LOW (ref 135–145)

## 2019-03-09 ENCOUNTER — Other Ambulatory Visit (HOSPITAL_COMMUNITY): Payer: Self-pay

## 2019-03-09 MED ORDER — EDOXABAN TOSYLATE 60 MG PO TABS: 60.0000 mg | ORAL_TABLET | Freq: Every day | ORAL | 0 refills | Status: DC

## 2019-03-16 ENCOUNTER — Other Ambulatory Visit: Payer: Self-pay | Admitting: Internal Medicine

## 2019-03-25 LAB — CUP PACEART REMOTE DEVICE CHECK
Date Time Interrogation Session: 20200402110354
Implantable Lead Implant Date: 20060927
Implantable Lead Location: 753860
Implantable Lead Model: 351
Implantable Lead Serial Number: 10260064
Implantable Pulse Generator Implant Date: 20120413
Pulse Gen Serial Number: 60613417

## 2019-03-26 ENCOUNTER — Ambulatory Visit (INDEPENDENT_AMBULATORY_CARE_PROVIDER_SITE_OTHER): Payer: Medicare Other | Admitting: *Deleted

## 2019-03-26 ENCOUNTER — Other Ambulatory Visit: Payer: Self-pay

## 2019-03-26 DIAGNOSIS — I255 Ischemic cardiomyopathy: Secondary | ICD-10-CM

## 2019-04-01 ENCOUNTER — Other Ambulatory Visit (HOSPITAL_COMMUNITY): Payer: Self-pay

## 2019-04-01 MED ORDER — AMIODARONE HCL 200 MG PO TABS: 200.0000 mg | ORAL_TABLET | Freq: Every day | ORAL | 3 refills | Status: DC

## 2019-04-02 ENCOUNTER — Other Ambulatory Visit: Payer: Self-pay

## 2019-04-02 DIAGNOSIS — G8929 Other chronic pain: Secondary | ICD-10-CM

## 2019-04-02 DIAGNOSIS — G4700 Insomnia, unspecified: Secondary | ICD-10-CM

## 2019-04-02 DIAGNOSIS — R0789 Other chest pain: Secondary | ICD-10-CM

## 2019-04-02 MED ORDER — DULOXETINE HCL 30 MG PO CPEP: 30.0000 mg | ORAL_CAPSULE | Freq: Every day | ORAL | 1 refills | Status: DC

## 2019-04-03 ENCOUNTER — Encounter: Payer: Self-pay | Admitting: Cardiology

## 2019-04-03 NOTE — Progress Notes (Signed)
Remote ICD transmission.   

## 2019-04-13 ENCOUNTER — Ambulatory Visit (INDEPENDENT_AMBULATORY_CARE_PROVIDER_SITE_OTHER): Payer: Medicare Other | Admitting: Internal Medicine

## 2019-04-13 ENCOUNTER — Encounter: Payer: Self-pay | Admitting: Internal Medicine

## 2019-04-13 DIAGNOSIS — R109 Unspecified abdominal pain: Secondary | ICD-10-CM

## 2019-04-13 MED ORDER — PREDNISONE 20 MG PO TABS: 40.0000 mg | ORAL_TABLET | Freq: Every day | ORAL | 0 refills | Status: DC

## 2019-04-13 MED ORDER — CYCLOBENZAPRINE HCL 5 MG PO TABS: 5.0000 mg | ORAL_TABLET | Freq: Three times a day (TID) | ORAL | 1 refills | Status: DC | PRN

## 2019-04-13 NOTE — Progress Notes (Signed)
Virtual Visit via Video Note  I connected with [facility]. on 04/13/19 at  3:40 PM EDT by a video enabled telemedicine application and verified that I am speaking with the correct person using two identifiers.   I discussed the limitations of evaluation and management by telemedicine and the availability of in person appointments. The patient expressed understanding and agreed to proceed.  History of Present Illness: The patient is a 73 y.o. man with visit for left side and back pain. Started about 1 week ago or so. Pain level is 7-8/10. Has tried tylenol and this worked for a couple of hours. Has no fevers or chills. Denies nausea or vomiting or diarrhea or constipation. Denies cough or SOB or body aches. Denies overuse or injury prior to onset. Overall it is stable but bad. Has tried tylenol which helped temporarily but overall he is not improving. Denies stomach problem or pain with stomach. Some pain when bending to put on shoes/socks.  Observations/Objective: Appearance: uncomfortable, breathing appears normal, casual grooming, abdomen does not appear distended, pain is located left flank from the bottom of the rib cage to the mid abdomen, throat not examined, mental status is A and O times 3  Assessment and Plan: See problem oriented charting  Follow Up Instructions: rx for prednisone and flexeril, can still use tylenol for pain  I discussed the assessment and treatment plan with the patient. The patient was provided an opportunity to ask questions and all were answered. The patient agreed with the plan and demonstrated an understanding of the instructions.   The patient was advised to call back or seek an in-person evaluation if the symptoms worsen or if the condition fails to improve as anticipated.  [name], MD

## 2019-04-14 ENCOUNTER — Encounter: Payer: Self-pay | Admitting: Internal Medicine

## 2019-04-14 DIAGNOSIS — R109 Unspecified abdominal pain: Secondary | ICD-10-CM | POA: Insufficient documentation

## 2019-04-14 NOTE — Assessment & Plan Note (Signed)
Rx for prednisone and flexeril as sounds musculoskeletal. If no improvement may need more evaluation with imaging or labs. Continue tylenol for pain as this is helpful.

## 2019-04-16 ENCOUNTER — Ambulatory Visit: Payer: Self-pay

## 2019-04-16 NOTE — Telephone Encounter (Signed)
Have her monitor meds to ensure he is taking his meds right and no extra. Monitor symptoms. Can stop prednisone if desired.

## 2019-04-16 NOTE — Telephone Encounter (Signed)
Wife called c/o pt having confusion, visual and auditory hallucinations, dizziness. The dizziness started 2 days ago. Pt stated he gets lightheaded and feels like he is going to faint. Then he sits down, feels better then goes to the bathroom. Pt stated he get sweaty after walking.  Wife stated that the pt stated that he saw their son in the house and he got up at 37 this morning and was moving his pills around and wife is not sure what meds he took today.  No arm drift, no slurred speech, face and mouth is equal. Wife was concerned the Prednisone may be a factor. BP taken during call 118/88 and HR 80. Care advice given and wife verbalized understanding. Wife verified e-mail. Called office and was asked to send triage note and office will call pt back.  Reason for Disposition . [1] Longstanding confusion (e.g., dementia, stroke) AND [2] worsening . [1] MILD dizziness (e.g., walking normally) AND [2] has NOT been evaluated by physician for this  (Exception: dizziness caused by heat exposure, sudden standing, or poor fluid intake)  Answer Assessment - Initial Assessment Questions 1. LEVEL OF CONSCIOUSNESS: "How is he (she, the patient) acting right now?" (e.g., alert-oriented, confused, lethargic, stuporous, comatose)     Laying sleepy  2. ONSET: "When did the confusion start?"  (minutes, hours, days)     He has dementia noticed it more today 3. PATTERN "Does this come and go, or has it been constant since it started?"  "Is it present now?"     Does not know  4. ALCOHOL or DRUGS: "Has he been drinking alcohol or taking any drugs?"      no 5. NARCOTIC MEDICATIONS: "Has he been receiving any narcotic medications?" (e.g., morphine, Vicodin)     No 6. CAUSE: "What do you think is causing the confusion?"      prednisone 7. OTHER SYMPTOMS: "Are there any other symptoms?" (e.g., difficulty breathing, headache, fever, weakness)     Visual hallucination and auditory hallucination  Answer Assessment -  Initial Assessment Questions 1. DESCRIPTION: "Describe your dizziness."     Feels like going to faint  2. LIGHTHEADED: "Do you feel lightheaded?" (e.g., somewhat faint, woozy, weak upon standing)     no 3. VERTIGO: "Do you feel like either you or the room is spinning or tilting?" (i.e. vertigo)     no 4. SEVERITY: "How bad is it?"  "Do you feel like you are going to faint?" "Can you stand and walk?"   - MILD - walking normally   - MODERATE - interferes with normal activities (e.g., work, school)    - SEVERE - unable to stand, requires support to walk, feels like passing out now.      mild 5. ONSET:  "When did the dizziness begin?"     2 days ago 6. AGGRAVATING FACTORS: "Does anything make it worse?" (e.g., standing, change in head position)     With walking 7. HEART RATE: "Can you tell me your heart rate?" "How many beats in 15 seconds?"  (Note: not all patients can do this)       80 BP  8. CAUSE: "What do you think is causing the dizziness?"     Not sure called thinks it is the Prednisone 9. RECURRENT SYMPTOM: "Have you had dizziness before?" If so, ask: "When was the last time?" "What happened that time?"     no 10. OTHER SYMPTOMS: "Do you have any other symptoms?" (e.g., fever, chest pain, vomiting,  diarrhea, bleeding)       Confusion, hallucinations, agitation that just started  11. PREGNANCY: "Is there any chance you are pregnant?" "When was your last menstrual period?"      n/a  Protocols used: CONFUSION - [place], [place]

## 2019-04-17 NOTE — Telephone Encounter (Signed)
LVM informing of MD response below

## 2019-04-20 ENCOUNTER — Ambulatory Visit: Payer: Self-pay

## 2019-04-20 NOTE — Telephone Encounter (Signed)
Noted going to urgent care

## 2019-04-20 NOTE — Telephone Encounter (Signed)
Incoming call from wife of Patient who complains of Patient having periods of confusion.  Onset was when he started taking prednisone.  Sometimes unable to walk patient has decreased    Appetite.  Wife states he has completed doses of prednisone.  She is no longer giving muscle relaxer either.  Wife reports that Patient complains of patients urine has an odor,  Complains of patient has lower side/back pain.  Reviewed protocol and care advice with Patient and wife. Protocol recommends to go to ED or Urgent care.  Wife states that he will take him to Urgent care.   Reason for Disposition . Patient sounds very sick or weak to the triager  Answer Assessment - Initial Assessment Questions 1. SYMPTOM: "What's the main symptom you're concerned about?" (e.g., frequency, incontinence)     *No Answer* 2. ONSET: "When did the  *No Answer*  start?"     yesterday 3. PAIN: "Is there any pain?" If so, ask: "How bad is it?" (Scale: 1-10; mild, moderate, severe)     denies 4. CAUSE: "What do you think is causing the symptoms?"     *No Answer* 5. OTHER SYMPTOMS: "Do you have any other symptoms?" (e.g., fever, flank pain, blood in urine, pain with urination)     Denies pain6. PREGNANCY: "Is there any chance you are pregnant?" "When was your last menstrual period?"     na  Answer Assessment - Initial Assessment Questions 1. SEVERITY: "How bad is the pain?"  (e.g., Scale 1-10; mild, moderate, or severe)   - MILD (1-3): complains slightly about urination hurting   - MODERATE (4-7): interferes with normal activities     - SEVERE (8-10): excruciating, unwilling or unable to urinate because of the pain    severe 2. FREQUENCY: "How many times have you had painful urination today?"      everytime he gets up to move 3. PATTERN: "Is pain present every time you urinate or just sometimes?"      Yes  4. ONSET: "When did the painful urination start?"      Over a week ago 5. FEVER: "Do you have a fever?" If so, ask:  "What is your temperature, how was it measured, and when did it start?"      6. PAST UTI: "Have you had a urine infection before?" If so, ask: "When was the last time?" and "What happened that time?"       unknown 7. CAUSE: "What do you think is causing the painful urination?"      *No Answer* 8. OTHER SYMPTOMS: "Do you have any other symptoms?" (e.g., flank pain, penile discharge, scrotal pain, blood in urine)     *denies  Answer Assessment - Initial Assessment Questions 1. LEVEL OF CONSCIOUSNESS: "How is he (she, the patient) acting right now?" (e.g., alert-oriented, confused, lethargic, stuporous, comatose)    More confused last nite.not as active as usual 2. ONSET: "When did the confusion start?"  (minutes, hours, days)    With the start of prenidisone 3. PATTERN "Does this come and go, or has it been constant since it started?"  "Is it present now?"     Come and go 4. ALCOHOL or DRUGS: "Has he been drinking alcohol or taking any drugs?"    denies 5. NARCOTIC MEDICATIONS: "Has he been receiving any narcotic medications?" (e.g., morphine, Vicodin)     denies 6. CAUSE: "What do you think is causing the confusion?"      *No Answer* 7. OTHER SYMPTOMS: "Are  there any other symptoms?" (e.g., difficulty breathing, headache, fever, weakness)   Much weaker  Protocols used: CONFUSION - DELIRIUM-A-AH, URINARY SYMPTOMS-A-AH, URINATION PAIN - MALE-A-AH

## 2019-04-21 ENCOUNTER — Other Ambulatory Visit: Payer: Self-pay

## 2019-04-21 ENCOUNTER — Encounter (HOSPITAL_COMMUNITY): Payer: Self-pay | Admitting: General Practice

## 2019-04-21 ENCOUNTER — Emergency Department (HOSPITAL_COMMUNITY): Payer: Medicare Other

## 2019-04-21 ENCOUNTER — Inpatient Hospital Stay (HOSPITAL_COMMUNITY)
Admission: EM | Admit: 2019-04-21 | Discharge: 2019-04-24 | DRG: 394 | Disposition: A | Discharge status: Home / Self Care | Payer: Medicare Other | Attending: Internal Medicine | Admitting: Internal Medicine

## 2019-04-21 DIAGNOSIS — B9689 Other specified bacterial agents as the cause of diseases classified elsewhere: Secondary | ICD-10-CM | POA: Diagnosis present

## 2019-04-21 DIAGNOSIS — Z951 Presence of aortocoronary bypass graft: Secondary | ICD-10-CM

## 2019-04-21 DIAGNOSIS — Z7901 Long term (current) use of anticoagulants: Secondary | ICD-10-CM

## 2019-04-21 DIAGNOSIS — Z86010 Personal history of colonic polyps: Secondary | ICD-10-CM | POA: Diagnosis not present

## 2019-04-21 DIAGNOSIS — I10 Essential (primary) hypertension: Secondary | ICD-10-CM | POA: Diagnosis not present

## 2019-04-21 DIAGNOSIS — I4819 Other persistent atrial fibrillation: Secondary | ICD-10-CM | POA: Diagnosis not present

## 2019-04-21 DIAGNOSIS — R58 Hemorrhage, not elsewhere classified: Secondary | ICD-10-CM | POA: Diagnosis present

## 2019-04-21 DIAGNOSIS — Z79899 Other long term (current) drug therapy: Secondary | ICD-10-CM | POA: Diagnosis not present

## 2019-04-21 DIAGNOSIS — I130 Hypertensive heart and chronic kidney disease with heart failure and stage 1 through stage 4 chronic kidney disease, or unspecified chronic kidney disease: Secondary | ICD-10-CM | POA: Diagnosis present

## 2019-04-21 DIAGNOSIS — N400 Enlarged prostate without lower urinary tract symptoms: Secondary | ICD-10-CM | POA: Diagnosis present

## 2019-04-21 DIAGNOSIS — G40909 Epilepsy, unspecified, not intractable, without status epilepticus: Secondary | ICD-10-CM | POA: Diagnosis present

## 2019-04-21 DIAGNOSIS — Z8249 Family history of ischemic heart disease and other diseases of the circulatory system: Secondary | ICD-10-CM

## 2019-04-21 DIAGNOSIS — R531 Weakness: Secondary | ICD-10-CM | POA: Diagnosis not present

## 2019-04-21 DIAGNOSIS — Z87891 Personal history of nicotine dependence: Secondary | ICD-10-CM | POA: Diagnosis not present

## 2019-04-21 DIAGNOSIS — K661 Hemoperitoneum: Secondary | ICD-10-CM | POA: Diagnosis not present

## 2019-04-21 DIAGNOSIS — D62 Acute posthemorrhagic anemia: Secondary | ICD-10-CM | POA: Diagnosis present

## 2019-04-21 DIAGNOSIS — E785 Hyperlipidemia, unspecified: Secondary | ICD-10-CM | POA: Diagnosis present

## 2019-04-21 DIAGNOSIS — I482 Chronic atrial fibrillation, unspecified: Secondary | ICD-10-CM

## 2019-04-21 DIAGNOSIS — Z8261 Family history of arthritis: Secondary | ICD-10-CM

## 2019-04-21 DIAGNOSIS — N183 Chronic kidney disease, stage 3 (moderate): Secondary | ICD-10-CM | POA: Diagnosis present

## 2019-04-21 DIAGNOSIS — D682 Hereditary deficiency of other clotting factors: Secondary | ICD-10-CM | POA: Diagnosis present

## 2019-04-21 DIAGNOSIS — Z86718 Personal history of other venous thrombosis and embolism: Secondary | ICD-10-CM

## 2019-04-21 DIAGNOSIS — I2510 Atherosclerotic heart disease of native coronary artery without angina pectoris: Secondary | ICD-10-CM | POA: Diagnosis present

## 2019-04-21 DIAGNOSIS — Z95810 Presence of automatic (implantable) cardiac defibrillator: Secondary | ICD-10-CM

## 2019-04-21 DIAGNOSIS — E669 Obesity, unspecified: Secondary | ICD-10-CM | POA: Diagnosis present

## 2019-04-21 DIAGNOSIS — Z8614 Personal history of Methicillin resistant Staphylococcus aureus infection: Secondary | ICD-10-CM

## 2019-04-21 DIAGNOSIS — R109 Unspecified abdominal pain: Secondary | ICD-10-CM | POA: Diagnosis not present

## 2019-04-21 DIAGNOSIS — K029 Dental caries, unspecified: Secondary | ICD-10-CM | POA: Diagnosis present

## 2019-04-21 DIAGNOSIS — R52 Pain, unspecified: Secondary | ICD-10-CM | POA: Diagnosis not present

## 2019-04-21 DIAGNOSIS — I5022 Chronic systolic (congestive) heart failure: Secondary | ICD-10-CM | POA: Diagnosis present

## 2019-04-21 DIAGNOSIS — Z6832 Body mass index (BMI) 32.0-32.9, adult: Secondary | ICD-10-CM

## 2019-04-21 DIAGNOSIS — R404 Transient alteration of awareness: Secondary | ICD-10-CM | POA: Diagnosis not present

## 2019-04-21 DIAGNOSIS — I255 Ischemic cardiomyopathy: Secondary | ICD-10-CM | POA: Diagnosis not present

## 2019-04-21 DIAGNOSIS — N390 Urinary tract infection, site not specified: Secondary | ICD-10-CM | POA: Diagnosis present

## 2019-04-21 DIAGNOSIS — Z7952 Long term (current) use of systemic steroids: Secondary | ICD-10-CM

## 2019-04-21 DIAGNOSIS — R0902 Hypoxemia: Secondary | ICD-10-CM | POA: Diagnosis not present

## 2019-04-21 DIAGNOSIS — Z8673 Personal history of transient ischemic attack (TIA), and cerebral infarction without residual deficits: Secondary | ICD-10-CM

## 2019-04-21 HISTORY — DX: Hemorrhage, not elsewhere classified: R58

## 2019-04-21 LAB — CBC WITH DIFFERENTIAL/PLATELET
Abs Immature Granulocytes: 0.09 10*3/uL — ABNORMAL HIGH (ref 0.00–0.07)
Basophils Absolute: 0 10*3/uL (ref 0.0–0.1)
Basophils Relative: 0 %
Eosinophils Absolute: 0.1 10*3/uL (ref 0.0–0.5)
Eosinophils Relative: 1 %
HCT: 29.1 % — ABNORMAL LOW (ref 39.0–52.0)
Hemoglobin: 9 g/dL — ABNORMAL LOW (ref 13.0–17.0)
Immature Granulocytes: 1 %
Lymphocytes Relative: 6 %
Lymphs Abs: 0.6 10*3/uL — ABNORMAL LOW (ref 0.7–4.0)
MCH: 30.5 pg (ref 26.0–34.0)
MCHC: 30.9 g/dL (ref 30.0–36.0)
MCV: 98.6 fL (ref 80.0–100.0)
Monocytes Absolute: 1.9 10*3/uL — ABNORMAL HIGH (ref 0.1–1.0)
Monocytes Relative: 19 %
Neutro Abs: 7.1 10*3/uL (ref 1.7–7.7)
Neutrophils Relative %: 73 %
Platelets: 175 10*3/uL (ref 150–400)
RBC: 2.95 MIL/uL — ABNORMAL LOW (ref 4.22–5.81)
RDW: 15.4 % (ref 11.5–15.5)
WBC: 9.7 10*3/uL (ref 4.0–10.5)
nRBC: 0.6 % — ABNORMAL HIGH (ref 0.0–0.2)

## 2019-04-21 LAB — COMPREHENSIVE METABOLIC PANEL
ALT: 19 U/L (ref 0–44)
AST: 24 U/L (ref 15–41)
Albumin: 2.8 g/dL — ABNORMAL LOW (ref 3.5–5.0)
Alkaline Phosphatase: 78 U/L (ref 38–126)
Anion gap: 13 (ref 5–15)
BUN: 33 mg/dL — ABNORMAL HIGH (ref 8–23)
CO2: 23 mmol/L (ref 22–32)
Calcium: 8.7 mg/dL — ABNORMAL LOW (ref 8.9–10.3)
Chloride: 101 mmol/L (ref 98–111)
Creatinine, Ser: 1.35 mg/dL — ABNORMAL HIGH (ref 0.61–1.24)
GFR calc Af Amer: >60 mL/min (ref >60)
GFR calc non Af Amer: 53 mL/min — ABNORMAL LOW (ref >60)
Glucose, Bld: 94 mg/dL (ref 70–99)
Potassium: 4.2 mmol/L (ref 3.5–5.1)
Sodium: 137 mmol/L (ref 135–145)
Total Bilirubin: 1.7 mg/dL — ABNORMAL HIGH (ref 0.3–1.2)
Total Protein: 7.2 g/dL (ref 6.5–8.1)

## 2019-04-21 LAB — URINALYSIS, ROUTINE W REFLEX MICROSCOPIC
Bilirubin Urine: NEGATIVE
Glucose, UA: NEGATIVE mg/dL
Ketones, ur: 5 mg/dL — AB
Nitrite: NEGATIVE
Protein, ur: 30 mg/dL — AB
Specific Gravity, Urine: 1.03 (ref 1.005–1.030)
WBC, UA: >50 WBC/hpf — ABNORMAL HIGH (ref 0–5)
pH: 5 (ref 5.0–8.0)

## 2019-04-21 LAB — CBG MONITORING, ED
Glucose-Capillary: 68 mg/dL — ABNORMAL LOW (ref 70–99)
Glucose-Capillary: 70 mg/dL (ref 70–99)

## 2019-04-21 MED ORDER — ACETAMINOPHEN 325 MG PO TABS: 650.0000 mg | ORAL_TABLET | Freq: Four times a day (QID) | ORAL | Status: DC | PRN

## 2019-04-21 MED ORDER — IOHEXOL 350 MG/ML SOLN
100.0000 mL | Freq: Once | INTRAVENOUS | Status: AC | PRN
  Administered 2019-04-21: 100 mL via INTRAVENOUS

## 2019-04-21 MED ORDER — SENNOSIDES-DOCUSATE SODIUM 8.6-50 MG PO TABS
2.0000 | ORAL_TABLET | Freq: Two times a day (BID) | ORAL | Status: DC
  Administered 2019-04-21 – 2019-04-24 (×6): 2 via ORAL
  Filled 2019-04-21 (×6): qty 2

## 2019-04-21 MED ORDER — DIVALPROEX SODIUM ER 500 MG PO TB24
500.0000 mg | ORAL_TABLET | Freq: Every day | ORAL | Status: DC
  Administered 2019-04-22 – 2019-04-24 (×3): 500 mg via ORAL
  Filled 2019-04-21 (×3): qty 1

## 2019-04-21 MED ORDER — TAMSULOSIN HCL 0.4 MG PO CAPS
0.4000 mg | ORAL_CAPSULE | Freq: Every day | ORAL | Status: DC
  Administered 2019-04-22 – 2019-04-24 (×3): 0.4 mg via ORAL
  Filled 2019-04-21 (×3): qty 1

## 2019-04-21 MED ORDER — PREDNISONE 20 MG PO TABS
40.0000 mg | ORAL_TABLET | Freq: Every day | ORAL | Status: DC
  Administered 2019-04-22: 10:00:00 40 mg via ORAL
  Filled 2019-04-21: qty 2

## 2019-04-21 MED ORDER — ACETAMINOPHEN 325 MG PO TABS
650.0000 mg | ORAL_TABLET | Freq: Four times a day (QID) | ORAL | Status: DC | PRN
  Administered 2019-04-22 – 2019-04-24 (×3): 650 mg via ORAL
  Filled 2019-04-21 (×3): qty 2

## 2019-04-21 MED ORDER — FUROSEMIDE 40 MG PO TABS
40.0000 mg | ORAL_TABLET | Freq: Every day | ORAL | Status: DC
  Administered 2019-04-22: 10:00:00 40 mg via ORAL
  Filled 2019-04-21 (×2): qty 1

## 2019-04-21 MED ORDER — LOSARTAN POTASSIUM 25 MG PO TABS
25.0000 mg | ORAL_TABLET | Freq: Every day | ORAL | Status: DC
  Administered 2019-04-22 – 2019-04-24 (×2): 25 mg via ORAL
  Filled 2019-04-21 (×3): qty 1

## 2019-04-21 MED ORDER — SIMVASTATIN 20 MG PO TABS
20.0000 mg | ORAL_TABLET | Freq: Every day | ORAL | Status: DC
  Administered 2019-04-21 – 2019-04-23 (×3): 20 mg via ORAL
  Filled 2019-04-21 (×3): qty 1

## 2019-04-21 MED ORDER — ISOSORBIDE MONONITRATE ER 30 MG PO TB24
30.0000 mg | ORAL_TABLET | Freq: Every day | ORAL | Status: DC
  Administered 2019-04-22 – 2019-04-23 (×2): 30 mg via ORAL
  Filled 2019-04-21 (×2): qty 1

## 2019-04-21 MED ORDER — ONDANSETRON HCL 4 MG PO TABS: 4.0000 mg | ORAL_TABLET | Freq: Four times a day (QID) | ORAL | Status: DC | PRN

## 2019-04-21 MED ORDER — ACETAMINOPHEN 325 MG PO TABS
650.0000 mg | ORAL_TABLET | Freq: Once | ORAL | Status: AC
  Administered 2019-04-21: 650 mg via ORAL
  Filled 2019-04-21: qty 2

## 2019-04-21 MED ORDER — SODIUM CHLORIDE 0.9 % IV BOLUS
500.0000 mL | Freq: Once | INTRAVENOUS | Status: AC
  Administered 2019-04-21: 500 mL via INTRAVENOUS

## 2019-04-21 MED ORDER — DULOXETINE HCL 30 MG PO CPEP
30.0000 mg | ORAL_CAPSULE | Freq: Every day | ORAL | Status: DC
  Administered 2019-04-22 – 2019-04-24 (×3): 30 mg via ORAL
  Filled 2019-04-21 (×3): qty 1

## 2019-04-21 MED ORDER — VITAMIN D (ERGOCALCIFEROL) 1.25 MG (50000 UNIT) PO CAPS: 50000.0000 [IU] | ORAL_CAPSULE | ORAL | Status: DC

## 2019-04-21 MED ORDER — LEVETIRACETAM 500 MG PO TABS
1000.0000 mg | ORAL_TABLET | Freq: Two times a day (BID) | ORAL | Status: DC
  Administered 2019-04-21 – 2019-04-24 (×6): 1000 mg via ORAL
  Filled 2019-04-21 (×7): qty 2

## 2019-04-21 MED ORDER — HYDRALAZINE HCL 25 MG PO TABS
25.0000 mg | ORAL_TABLET | Freq: Three times a day (TID) | ORAL | Status: DC
  Administered 2019-04-21 – 2019-04-24 (×6): 25 mg via ORAL
  Filled 2019-04-21 (×7): qty 1

## 2019-04-21 MED ORDER — ACETAMINOPHEN 650 MG RE SUPP: 650.0000 mg | Freq: Four times a day (QID) | RECTAL | Status: DC | PRN

## 2019-04-21 MED ORDER — POLYETHYLENE GLYCOL 3350 17 G PO PACK: 17.0000 g | PACK | Freq: Every day | ORAL | Status: DC | PRN

## 2019-04-21 MED ORDER — CYCLOBENZAPRINE HCL 5 MG PO TABS: 5.0000 mg | ORAL_TABLET | Freq: Three times a day (TID) | ORAL | Status: DC | PRN

## 2019-04-21 MED ORDER — CARVEDILOL 12.5 MG PO TABS
12.5000 mg | ORAL_TABLET | Freq: Two times a day (BID) | ORAL | Status: DC
  Administered 2019-04-21 – 2019-04-24 (×6): 12.5 mg via ORAL
  Filled 2019-04-21 (×6): qty 1

## 2019-04-21 MED ORDER — OXYCODONE HCL 5 MG PO TABS
5.0000 mg | ORAL_TABLET | ORAL | Status: DC | PRN
  Administered 2019-04-22 – 2019-04-23 (×2): 5 mg via ORAL
  Filled 2019-04-21 (×3): qty 1

## 2019-04-21 MED ORDER — AMIODARONE HCL 200 MG PO TABS
200.0000 mg | ORAL_TABLET | Freq: Every day | ORAL | Status: DC
  Administered 2019-04-22 – 2019-04-24 (×3): 200 mg via ORAL
  Filled 2019-04-21 (×3): qty 1

## 2019-04-21 MED ORDER — SODIUM CHLORIDE 0.9 % IV SOLN
INTRAVENOUS | Status: AC
  Administered 2019-04-21 – 2019-04-23 (×3): via INTRAVENOUS

## 2019-04-21 MED ORDER — ONDANSETRON HCL 4 MG/2ML IJ SOLN: 4.0000 mg | Freq: Four times a day (QID) | INTRAMUSCULAR | Status: DC | PRN

## 2019-04-21 MED ORDER — PHENYTOIN SODIUM EXTENDED 100 MG PO CAPS
100.0000 mg | ORAL_CAPSULE | Freq: Two times a day (BID) | ORAL | Status: DC
  Administered 2019-04-21 – 2019-04-24 (×6): 100 mg via ORAL
  Filled 2019-04-21 (×8): qty 1

## 2019-04-21 NOTE — ED Notes (Signed)
CBG rechecked same noted to be 72.

## 2019-04-21 NOTE — ED Triage Notes (Signed)
Pt brought in by ems from home for c/o aloc on and off x [redacted] week along with left sided flank pain  and dark urine that began a couple of days ago; pt denies any dysuria ; no cough no fevers ; pt alert and oriented x 4  And following simple commands  ; pt was getting prednisone from pcp for left hip pain

## 2019-04-21 NOTE — ED Provider Notes (Signed)
[facility] EMERGENCY DEPARTMENT Provider Note   CSN: [id number] Arrival date & time: 04/21/19  1153    History   Chief Complaint Chief Complaint  Patient presents with   Flank Pain   Altered Mental Status    HPI [facility]. is a 73 y.o. male with past medical history of stroke, atrial fibrillation, CHF, cardiomyopathy, factor VII deficiency, AICD present, seizure disorder, CKD, presenting to the emergency department with about 1 week of intermittent episodes of confusion.  Patient has had 2 weeks of gradual onset of left-sided flank pain and was evaluated via tele-visit by PCP on 04/13/2019.  He was prescribed prednisone burst and Flexeril for what seemed to be musculoskeletal pain, per chart review.  After beginning his medications, his wife reports that he began having intermittent episodes of confusion described as disorientation with associated fatigue, generalized weakness, mood changes, some agitation in the evenings, and even some hallucinations.  She states she thought it may have been the prednisone therefore she did not give him the fifth day of the prednisone course.  She states he was prescribed Flexeril 3 times daily, however she gave it to him twice daily.  She states yesterday she decreased it to once a day and he has not had any today.  She reports on Sunday she noticed that his urine looked darker in color.  He has had decreased p.o. intake as well.  She was checking his blood pressures and temperature at home and they have been normal.  She states she does not think he has had a bowel movement in 3 days.  Patient states he is aware of the episodes of his disorientation.  He states he has problems remembering the date and the president and this concerned him because he is normally very oriented.  He states he reads books quite frequently.  He states the pain in his left side has been worsening and now radiating towards his abdomen.  He denies dysuria,  increased urinary frequency.  He endorses darker color urine and malodorous urine.  He denies injury or previous pain in this area.  No fevers, chest pain, nausea, vomiting, unilateral weakness or numbness.  His wife reports he is compliant with his anticoagulant and has not missed any doses.     The history is provided by the patient and the spouse.    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[place]) 10/07/2012   GIB (gastrointestinal bleeding) 02/20/2017   History of MRSA infection 05/2011   HTN (hypertension)    Hx of adenomatous colonic polyps 02/20/2017   01/2017 - 3 cm sigmoid TV adenoma and other smaller polyps - had post-polypectomy bleed Tx w/ clips Consider repeat colonoscopy 3 yrs [name], MD, [facility]    Hyperlipidemia    implantable cardiac defibrillator-Biotronik    Device Implanted 2006; s/p gen change 03/2011 : bleeding persistent with pocket erosion and infection; explant and reimplant  06/2011   Ischemic cardiomyopathy    EF 15 to 20% by TTE and TEE in 09/2012.  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   Elevated TSH 10/27/2017   Weakness of both lower extremities 10/27/2017   Unsteady gait 10/27/2017   Localization-related symptomatic epilepsy and epileptic syndromes with complex  partial seizures, not intractable, without status epilepticus ([place]) 10/08/2017   Insomnia 09/02/2017   Degenerative joint disease of knee, left 08/01/2017   Blood loss anemia 04/18/2017   Hx of adenomatous colonic polyps 02/20/2017   Chronic pain of left knee 01/22/2017   Chronic kidney disease (CKD), stage III (moderate) ([place]) 11/08/2016   Hypertensive heart disease without heart failure    Chronic chest wall pain 08/17/2016   Periodontal disease 05/18/2016   HLD (hyperlipidemia) 02/26/2016   CVA (cerebral infarction) 02/26/2016   Near syncope 02/26/2016   Gout 12/30/2015   Persistent atrial fibrillation with rapid ventricular response 82/50/5397   Chronic systolic CHF (congestive heart failure), NYHA class 2 ([place]) 11/06/2012   Factor VII deficiency ([place]) 10/07/2012   Seizure ([place]) 10/01/2012   Renal insufficiency 10/01/2012   Ischemic cardiomyopathy  ? additional rate component 09/29/2012   Automatic implantable cardioverter-defibrillator in situ    HTN (hypertension) 09/21/2012   Coronary artery disease involving native heart 09/21/2012    Past Surgical History:  Procedure Laterality Date   CARDIAC CATHETERIZATION N/A 10/12/2015   Procedure: Right Heart Cath;  Surgeon: [name], MD;  Location: [facility] CV LAB;  Service: Cardiovascular;  Laterality: N/A;   CARDIOVERSION  09/24/2012   Procedure: CARDIOVERSION;  Surgeon: [name], MD;  Location: [facility] ENDOSCOPY;  Service: Cardiovascular;  Laterality: N/A;   CARDIOVERSION N/A 12/25/2017   Procedure: CARDIOVERSION;  Surgeon: [name], MD;  Location: [facility] ORS;  Service: Cardiovascular;  Laterality: N/A;   COLONOSCOPY W/ POLYPECTOMY  02/13/2017   CORONARY ARTERY BYPASS GRAFT  2011   in [place] N/A 02/20/2017   Procedure: FLEXIBLE SIGMOIDOSCOPY;  Surgeon: [name], MD;  Location: [facility] ENDOSCOPY;  Service: Endoscopy;  Laterality: N/A;   FLEXIBLE SIGMOIDOSCOPY N/A  02/21/2017   Procedure: FLEXIBLE SIGMOIDOSCOPY;  Surgeon: [name], MD;  Location: [facility] ENDOSCOPY;  Service: Endoscopy;  Laterality: N/A;   ICD     TEE WITHOUT CARDIOVERSION  09/24/2012   Procedure: TRANSESOPHAGEAL ECHOCARDIOGRAM (TEE);  Surgeon: [name], MD;  Location: [facility] ENDOSCOPY;  Service: Cardiovascular;  Laterality: N/A;  dave/anesth, dl, cindy/echo         Home Medications    Prior to Admission medications   Medication Sig Start Date End Date Taking? Authorizing Provider  acetaminophen (TYLENOL) 325 MG tablet Take 650 mg by mouth every 6 (six) hours as needed for mild pain.    [provider]  amiodarone (PACERONE) 200 MG tablet Take 1 tablet (200 mg total) by mouth daily. 04/01/19   Larey Dresser, MD  carvedilol (COREG) 12.5 MG tablet TAKE 1 TABLET BY MOUTH TWICE DAILY WITH A MEAL Patient taking differently: Take 12.5 mg by mouth 2 (two) times daily with a meal.  03/16/19   [name], MD  cyclobenzaprine (FLEXERIL) 5 MG tablet Take 1 tablet (5 mg total) by mouth 3 (three) times daily as needed for muscle spasms. 04/13/19   [name],  MD  divalproex (DEPAKOTE ER) 500 MG 24 hr tablet Take 1 tablet every night Patient taking differently: Take 500 mg by mouth daily.  01/06/19   [name], MD  DULoxetine (CYMBALTA) 30 MG capsule Take 1 capsule (30 mg total) by mouth daily. 04/02/19   [name], MD  edoxaban (SAVAYSA) 60 MG TABS tablet Take 60 mg by mouth daily. 03/09/19   [name], [name], MD  furosemide (LASIX) 40 MG tablet Take 1 tablet (40 mg total) by mouth daily. 09/23/18   [name], MD  hydrALAZINE (APRESOLINE) 25 MG tablet Take 1 tablet (25 mg total) by mouth 3 (three) times daily. 01/29/19 04/29/19  Clegg, Amy D, NP  isosorbide mononitrate (IMDUR) 30 MG 24 hr tablet Take 1 tablet (30 mg total) by mouth daily. 01/29/19 04/29/19  Clegg, Amy D, NP  levETIRAcetam (KEPPRA) 1000 MG tablet Take 1 tablet (1,000 mg total) by mouth 2  (two) times daily. 01/06/19   [name], MD  losartan (COZAAR) 25 MG tablet TAKE 1 TABLET BY MOUTH ONCE DAILY Patient taking differently: Take 25 mg by mouth daily.  02/26/18   Larey Dresser, MD  nystatin (MYCOSTATIN) 100000 UNIT/ML suspension SWISH, GARGLE 5 TO 10 ML IN MOUTH, THEN SPIT OUT, EVERY 6 HOURS AS NEEDED Patient taking differently: Use as directed 5-10 mLs in the mouth or throat every 6 (six) hours.  10/22/18   [name], MD  phenytoin (DILANTIN) 100 MG ER capsule Take 1 capsule (100 mg total) by mouth 2 (two) times daily. 01/06/19   [name], MD  polyethylene glycol [facility] Floria Raveling) packet Take 17 g by mouth daily as needed. Patient taking differently: Take 17 g by mouth daily as needed for mild constipation.  02/25/17   Hosie Poisson, MD  predniSONE (DELTASONE) 20 MG tablet Take 2 tablets (40 mg total) by mouth daily with breakfast. 04/13/19   [name], MD  senna-docusate (SENOKOT-S) 8.6-50 MG tablet Take 2 tablets by mouth 2 (two) times daily. 02/25/17   Hosie Poisson, MD  simvastatin (ZOCOR) 20 MG tablet TAKE 1 TABLET BY MOUTH ONCE DAILY AT 6PM Patient taking differently: Take 20 mg by mouth daily at 6 PM.  03/16/19   [name], MD  spironolactone (ALDACTONE) 25 MG tablet TAKE 1 TABLET BY MOUTH ONCE DAILY Patient taking differently: Take 25 mg by mouth daily.  02/05/19   [name], MD  tamsulosin (FLOMAX) 0.4 MG CAPS capsule TAKE 1 CAPSULE BY MOUTH ONCE DAILY Patient taking differently: Take 0.4 mg by mouth daily.  02/05/19   [name], MD  Vitamin D, Ergocalciferol, (DRISDOL) 50000 units CAPS capsule Take 1 capsule (50,000 Units total) by mouth every 7 (seven) days. 11/28/17   [name], MD    Family History Family History  Problem Relation Age of Onset   Arthritis Mother    Heart disease Mother    Heart attack Mother    Other Father        smoker   Hypertension Neg Hx        unknown    Stroke Neg Hx        unknown    Social History Social History   Tobacco Use   Smoking status: Former Smoker    Types: Pipe    Last attempt to quit: 09/21/2008    Years since quitting: 10.5   Smokeless tobacco: Former Systems developer    Quit date: 09/21/2008  Substance  Use Topics   Alcohol use: No   Drug use: No     Allergies   Patient has no known allergies.   Review of Systems Review of Systems  Constitutional: Positive for appetite change and fatigue. Negative for fever.  Eyes: Negative for visual disturbance.  Respiratory: Negative for shortness of breath.   Cardiovascular: Negative for chest pain.  Gastrointestinal: Positive for abdominal pain. Negative for nausea and vomiting.  Genitourinary: Positive for flank pain. Negative for dysuria and frequency.  Musculoskeletal: Positive for back pain.  Neurological: Positive for weakness (generalized). Negative for facial asymmetry and headaches.  Hematological: Bruises/bleeds easily.  Psychiatric/Behavioral: Positive for confusion.  All other systems reviewed and are negative.    Physical Exam Updated Vital Signs BP (!) 106/56    Pulse 90    Temp 98.1 F (36.7 C) (Oral)    Resp 20    Wt 104.3 kg    SpO2 98%    BMI 32.08 kg/m   Physical Exam Vitals signs and nursing note reviewed.  Constitutional:      General: He is not in acute distress.    Appearance: He is well-developed.  HENT:     Head: Normocephalic and atraumatic.     Mouth/Throat:     Comments: Severely poor dentition throughout.  Multiple missing teeth.  Large decay present.  No sublingual edema or tenderness.  No fluctuant gingival abscess.  No trismus.  Tolerating secretions. Eyes:     Conjunctiva/sclera: Conjunctivae normal.  Neck:     Musculoskeletal: Normal range of motion.  Cardiovascular:     Rate and Rhythm: Normal rate and regular rhythm.  Pulmonary:     Effort: Pulmonary effort is normal. No respiratory distress.     Breath sounds: Normal breath  sounds.  Abdominal:     General: Abdomen is flat. Bowel sounds are normal. There is no distension.     Palpations: Abdomen is soft.     Tenderness: There is abdominal tenderness in the left lower quadrant. There is left CVA tenderness. There is no guarding or rebound.  Musculoskeletal:     Comments: Trace pretibial edema bilaterally  Skin:    General: Skin is warm.  Neurological:     Mental Status: He is alert.     Comments: Mental Status:  Alert, oriented, thought content appropriate, able to give a coherent history. Speech sounds slightly slurred, however patient is missing multiple teeth and he has a wad of cotton in his left lower cheek due to dental pain.  Able to follow 2 step commands without difficulty.  Cranial Nerves:  II:  Peripheral visual fields grossly normal, pupils equal, round, reactive to light III,IV, VI: ptosis not present, extra-ocular motions intact bilaterally  V,VII: smile symmetric, facial light touch sensation equal VIII: hearing grossly normal to voice  X: uvula elevates symmetrically  XI: bilateral shoulder shrug symmetric and strong XII: midline tongue extension without fassiculations Motor:  Normal tone. 5/5 in upper and lower extremities bilaterally including strong and equal grip strength and dorsiflexion/plantar flexion Sensory: Pinprick and light touch normal in all extremities.  Cerebellar: normal finger-to-nose with bilateral upper extremities CV: distal pulses palpable throughout    Psychiatric:        Behavior: Behavior normal.      ED Treatments / Results  Labs (all labs ordered are listed, but only abnormal results are displayed) Labs Reviewed  COMPREHENSIVE METABOLIC PANEL - Abnormal; Notable for the following components:      Result Value  BUN 33 (*)    Creatinine, Ser 1.35 (*)    Calcium 8.7 (*)    Albumin 2.8 (*)    Total Bilirubin 1.7 (*)    GFR calc non Af Amer 53 (*)    All other components within normal limits  CBC WITH  DIFFERENTIAL/PLATELET - Abnormal; Notable for the following components:   RBC 2.95 (*)    Hemoglobin 9.0 (*)    HCT 29.1 (*)    nRBC 0.6 (*)    Lymphs Abs 0.6 (*)    Monocytes Absolute 1.9 (*)    Abs Immature Granulocytes 0.09 (*)    All other components within normal limits  CBG MONITORING, ED - Abnormal; Notable for the following components:   Glucose-Capillary 68 (*)    All other components within normal limits  URINE CULTURE  URINALYSIS, ROUTINE W REFLEX MICROSCOPIC  CBG MONITORING, ED    EKG EKG Interpretation  Date/Time:  Tuesday April 21 2019 11:57:22 EDT Ventricular Rate:  80 PR Interval:    QRS Duration: 122 QT Interval:  436 QTC Calculation: 503 R Axis:   -30 Text Interpretation:  Sinus rhythm Nonspecific intraventricular conduction delay Probable anteroseptal infarct, old Nonspecific repol abnormality, lateral leads No STEMI. Similar to Feb 2020 tracing.  Confirmed by Nanda Quinton [phone]) on 04/21/2019 12:03:25 PM   Radiology Ct Angio Chest/abd/pel For Dissection W And/or Wo Contrast  Result Date: 04/21/2019 CLINICAL DATA:  Evaluate for dissection, left flank pain, dark urine EXAM: CT ANGIOGRAPHY CHEST, ABDOMEN AND PELVIS WITH AND WITHOUT CONTRAST TECHNIQUE: Multidetector CT imaging through the chest, abdomen and pelvis was performed. The chest was imaged prior to the administration of intravenous contrast. The chest, abdomen, and pelvis were imaged in the arterial phase following the administration of intravenous contrast. Multiplanar reconstructed images and MIPs were obtained and reviewed to evaluate the vascular anatomy. CONTRAST:  123mL OMNIPAQUE IOHEXOL 350 MG/ML SOLN COMPARISON:  None. FINDINGS: CTA CHEST FINDINGS Cardiovascular: Satisfactory opacification of the pulmonary arteries to the segmental level. No evidence of pulmonary embolism. Coronary artery calcifications. Pericardial calcifications. Right chest multi lead pacer. Mediastinum/Nodes: No enlarged  mediastinal, hilar, or axillary lymph nodes. Thyroid gland, trachea, and esophagus demonstrate no significant findings. Lungs/Pleura: Small left pleural effusion, possibly chronic and loculated, with associated atelectasis or consolidation. Upper Abdomen: No acute abnormality. Musculoskeletal: No chest wall abnormality. No acute or significant osseous findings. Review of the MIP images confirms the above findings. CTA ABDOMEN AND PELVIS FINDINGS VASCULAR Aorta: Moderate to severe mixed calcific atherosclerosis of the aorta. Normal caliber aorta without aneurysm, dissection, vasculitis or significant stenosis. Celiac: Patent without evidence of aneurysm, dissection, vasculitis or significant stenosis. SMA: Patent without evidence of aneurysm, dissection, vasculitis or significant stenosis. Renals: Solitary renal arteries are patent without evidence of aneurysm, dissection, or significant stenosis. IMA: Patent without evidence of aneurysm, dissection, vasculitis or significant stenosis. Inflow: Severe mixed calcific atherosclerosis. Patent without evidence of aneurysm, dissection, vasculitis or significant stenosis. Veins: No obvious venous abnormality within the limitations of this arterial phase study. Review of the MIP images confirms the above findings. NON-VASCULAR Hepatobiliary: No focal liver abnormality is seen. No gallstones, gallbladder wall thickening, or biliary dilatation. Pancreas: Unremarkable. No pancreatic ductal dilatation or surrounding inflammatory changes. Spleen: Normal in size without focal abnormality. Adrenals/Urinary Tract: Adrenal glands are unremarkable. Numerous bilateral renal cysts. No hydronephrosis. Bladder is unremarkable. Stomach/Bowel: Stomach is within normal limits. Appendix appears normal. No evidence of bowel wall thickening, distention, or inflammatory changes. Vascular/Lymphatic: No significant vascular findings are  present. No enlarged abdominal or pelvic lymph nodes.  Reproductive: No mass or other abnormality. Other: No abdominal wall hernia or abnormality. No abdominopelvic ascites. There is a large retroperitoneal hematoma of the left psoas muscle body measuring at least 10.6 x 8.9 x 18.2 cm (series 6, image 202, series 10, image 130). Musculoskeletal: No acute or significant osseous findings. Review of the MIP images confirms the above findings. IMPRESSION: 1. There is a large retroperitoneal hematoma of the left psoas muscle body measuring at least 10.6 x 8.9 x 18.2 cm (series 6, image 202, series 10, image 130). 2. Aortic atherosclerosis without evidence of aneurysm, dissection, or other acute aortic syndrome. 3.  Other chronic and incidental findings as detailed above. Electronically Signed   By: Eddie Candle M.D.   On: 04/21/2019 14:08    Procedures Procedures (including critical care time)  Medications Ordered in ED Medications  sodium chloride 0.9 % bolus 500 mL (0 mLs Intravenous Stopped 04/21/19 1447)  iohexol (OMNIPAQUE) 350 MG/ML injection 100 mL (100 mLs Intravenous Contrast Given 04/21/19 1352)  acetaminophen (TYLENOL) tablet 650 mg (650 mg Oral Given 04/21/19 1451)     Initial Impression / Assessment and Plan / ED Course  I have reviewed the triage vital signs and the nursing notes.  Pertinent labs & imaging results that were available during my care of the patient were reviewed by me and considered in my medical decision making (see chart for details).  Clinical Course as of Apr 20 1499  Tue Apr 21, 2019  1200 Spoke with patient's wife over the phone, Mardene Celeste.  She provides history.   [JR]  [place], with surgery recommends no surgical intervention at this time.  If patient does require intervention, recommend consult IR.   [JR]    Clinical Course User Index [JR] Kadesha Virrueta, [place] N, PA-C       Patient presenting with significant left flank and hip pain that began 2 weeks ago with onset of intermittent episodes of confusion  after starting Flexeril per PCP or symptom management.  Largely suspect the episodes of confusion with mood changes are attributed to the Flexeril.  Regarding the left flank pain,  CT was obtained further evaluation which reveals large retroperitoneal hematoma surrounding the left psoas muscle.  There is no obvious evidence of extravasation of contrast into this hematoma however patient is currently anticoagulated on savaysa (edoxaban), with history of A. fib and factor VII deficiency.  Hemoglobin from 4 months ago was over 16, today it is 9.  Do not believe hematoma is infected, no leukocytosis or fever.  Regarding patient's fatigue and weakness, this may be attributed to the anemia versus the recent Flexeril medication.  The CT finding was discussed with general surgery, PA Janett Billow, who recommends no further surgical intervention at this time.  She states if evidence of active bleeding is occurring, this would be IR intervention.  Patient discussed with Dr. Laverta Baltimore.  At this time, believe medical admission is indicated.  Patient has minimal pain at rest, however with significant pain with any movement or ambulation.  He is unable to ambulate at home.  Recommend trend hemoglobin to evaluate for possible need of iron intervention.  Will need to address patient's anticoagulation as well.  Hospitalist consulted and accepted to telemetry for observation. Appreciate assistance with care.  The patient appears reasonably stabilized for admission considering the current resources, flow, and capabilities available in the ED at this time, and I doubt any other [facility] requiring further screening  and/or treatment in the ED prior to admission.  Final Clinical Impressions(s) / ED Diagnoses   Final diagnoses:  Retroperitoneal hematoma  Acute blood loss anemia    ED Discharge Orders    None       Felder Lebeda, [place] N, PA-C 04/21/19 1500    Margette Fast, MD 04/22/19 [phone]

## 2019-04-21 NOTE — H&P (Signed)
[facility]                                                                                    Patient Demographics  Louis Ford, is a 73 y.o. male  CSN: [id number]  MRN: [id number]  DOB - 17-Oct-1946  Admit Date - 04/21/2019  Outpatient Primary MD for the patient is [name], MD   With History of -  Past Medical History:  Diagnosis Date  . AICD (automatic cardioverter/defibrillator) present   . Atrial fibrillation   09/22/2012  . Blood loss anemia 04/18/2017   After GI bleed from colonoscopy and polypectomy  . CAD (coronary artery disease)   . Chronic systolic heart failure ([place])   . 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 GIB (gastrointestinal bleeding) 02/20/2017  . History of MRSA infection 05/2011  . HTN (hypertension)   . Hx of adenomatous colonic polyps 02/20/2017   01/2017 - 3 cm sigmoid TV adenoma and other smaller polyps - had post-polypectomy bleed Tx w/ clips Consider repeat colonoscopy 3 yrs [name], MD, [name]   . Hyperlipidemia   . implantable cardiac defibrillator-Biotronik    Device Implanted 2006; s/p gen change 03/2011 : bleeding persistent with pocket erosion and infection; explant and reimplant  06/2011  . Ischemic cardiomyopathy    EF 15 to 20% by TTE and TEE in 09/2012.  Severe LV dysfunction  . Obesity    BMI 31 in 09/2012  . Persistent atrial fibrillation with rapid ventricular response 04/21/2014  . Seizure disorder ([place]) latest 09/30/2012  . Seizures ([place])   . Stroke [facility])       Past Surgical History:  Procedure Laterality Date  . CARDIAC CATHETERIZATION N/A 10/12/2015   Procedure: Right Heart Cath;  Surgeon: [name], MD;  Location: [facility];  Service: Cardiovascular;  Laterality: N/A;  . CARDIOVERSION  09/24/2012   Procedure: CARDIOVERSION;  Surgeon: [name], MD;  Location: [facility] ENDOSCOPY;  Service: Cardiovascular;  Laterality: N/A;  .  CARDIOVERSION N/A 12/25/2017   Procedure: CARDIOVERSION;  Surgeon: [name], MD;  Location: [facility] ORS;  Service: Cardiovascular;  Laterality: N/A;  . COLONOSCOPY W/ POLYPECTOMY  02/13/2017  . CORONARY ARTERY BYPASS GRAFT  2011   in [place]  . FLEXIBLE SIGMOIDOSCOPY N/A 02/20/2017   Procedure: FLEXIBLE SIGMOIDOSCOPY;  Surgeon: [name], MD;  Location: [facility] ENDOSCOPY;  Service: Endoscopy;  Laterality: N/A;  . FLEXIBLE SIGMOIDOSCOPY N/A 02/21/2017   Procedure: FLEXIBLE SIGMOIDOSCOPY;  Surgeon: [name], MD;  Location: [facility] ENDOSCOPY;  Service: Endoscopy;  Laterality: N/A;  . ICD    . TEE WITHOUT CARDIOVERSION  09/24/2012   Procedure: TRANSESOPHAGEAL ECHOCARDIOGRAM (TEE);  Surgeon: [name], MD;  Location: [facility] ENDOSCOPY;  Service: Cardiovascular;  Laterality: N/A;  dave/anesth, dl, cindy/echo     in for   Chief Complaint  Patient presents with  . Flank Pain  . Altered Mental Status     HPI  Louis Ford  is a 73 y.o. male, with past medical history significant for chronic A. 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  The patient was seen by his PCP by telemetry and was prescribed prednisone and Flexeril.  No history of trauma, headache, chest pains or shortness of breath.  No history of fever or chills, nausea vomiting or diarrhea.  Patient reports dark urine.  His pain did not improve. Work-up in the emergency room noted with large retroperitoneal hematoma of the left psoas muscle measuring 10.6 x 8.9 x 18.2.    Review of Systems    In addition to the HPI above,  No Fever-chills, No Headache, No changes with Vision or hearing, No problems swallowing food or Liquids, No Chest pain, Cough or Shortness of Breath, No Abdominal pain, No Nausea or Vommitting, Bowel movements are regular, No dysuria, No new skin rashes or bruises, No new  weakness, tingling, numbness in any extremity, No recent weight gain or loss, No polyuria, polydypsia or polyphagia, No significant Mental Stressors.  A full 10 point Review of Systems was done, except as stated above, all other Review of Systems were negative.   Social History Social History   Tobacco Use  . Smoking status: Former Smoker    Types: Pipe    Last attempt to quit: 09/21/2008    Years since quitting: 10.5  . Smokeless tobacco: Former Systems developer    Quit date: 09/21/2008  Substance Use Topics  . Alcohol use: No     Family History Family History  Problem Relation Age of Onset  . Arthritis Mother   . Heart disease Mother   . Heart attack Mother   . Other Father        smoker  . Hypertension Neg Hx        unknown  . Stroke Neg Hx        unknown     Prior to Admission medications   Medication Sig Start Date End Date Taking? Authorizing Provider  amiodarone (PACERONE) 200 MG tablet Take 1 tablet (200 mg total) by mouth daily. 04/01/19  Yes Larey Dresser, MD  carvedilol (COREG) 12.5 MG tablet TAKE 1 TABLET BY MOUTH TWICE DAILY WITH A MEAL Patient taking differently: Take 12.5 mg by mouth 2 (two) times daily with a meal.  03/16/19  Yes [name], MD  divalproex (DEPAKOTE ER) 500 MG 24 hr tablet Take 1 tablet every night Patient taking differently: Take 500 mg by mouth daily.  01/06/19  Yes [name], MD  edoxaban (SAVAYSA) 60 MG TABS tablet Take 60 mg by mouth daily. 03/09/19  Yes [name], [name], MD  hydrALAZINE (APRESOLINE) 25 MG tablet Take 1 tablet (25 mg total) by mouth 3 (three) times daily. 01/29/19 04/29/19 Yes Clegg, Amy D, NP  isosorbide mononitrate (IMDUR) 30 MG 24 hr tablet Take 1 tablet (30 mg total) by mouth daily. 01/29/19 04/29/19 Yes Clegg, Amy D, NP  levETIRAcetam (KEPPRA) 1000 MG tablet Take 1 tablet (1,000 mg total) by mouth 2 (two) times daily. 01/06/19  Yes [name], MD  phenytoin (DILANTIN) 100 MG ER capsule Take 1 capsule (100 mg  total) by mouth 2 (two) times daily. 01/06/19  Yes [name], MD  acetaminophen (TYLENOL) 325 MG tablet Take 650 mg by mouth every 6 (six) hours as needed for mild pain.    [provider]  cyclobenzaprine (FLEXERIL) 5 MG tablet Take 1 tablet (5 mg total) by mouth 3 (three) times daily as needed for muscle spasms.  Patient not taking: Reported on 04/21/2019 04/13/19   [name], MD  DULoxetine (CYMBALTA) 30 MG capsule Take 1 capsule (30 mg total) by mouth daily. 04/02/19   [name], MD  furosemide (LASIX) 40 MG tablet Take 1 tablet (40 mg total) by mouth daily. 09/23/18   [name], MD  losartan (COZAAR) 25 MG tablet TAKE 1 TABLET BY MOUTH ONCE DAILY Patient not taking: No sig reported 02/26/18   Larey Dresser, MD  polyethylene glycol [facility] / Floria Raveling) packet Take 17 g by mouth daily as needed. Patient not taking: Reported on 04/21/2019 02/25/17   Hosie Poisson, MD  senna-docusate (SENOKOT-S) 8.6-50 MG tablet Take 2 tablets by mouth 2 (two) times daily. 02/25/17   Hosie Poisson, MD  simvastatin (ZOCOR) 20 MG tablet TAKE 1 TABLET BY MOUTH ONCE DAILY AT 6PM Patient taking differently: Take 20 mg by mouth daily at 6 PM.  03/16/19   [name], MD  spironolactone (ALDACTONE) 25 MG tablet TAKE 1 TABLET BY MOUTH ONCE DAILY Patient taking differently: Take 25 mg by mouth daily.  02/05/19   [name], MD  tamsulosin (FLOMAX) 0.4 MG CAPS capsule TAKE 1 CAPSULE BY MOUTH ONCE DAILY Patient taking differently: Take 0.4 mg by mouth daily.  02/05/19   [name], MD  Vitamin D, Ergocalciferol, (DRISDOL) 50000 units CAPS capsule Take 1 capsule (50,000 Units total) by mouth every 7 (seven) days. 11/28/17   [name], MD    No Known Allergies  Physical Exam  Vitals  Blood pressure (!) 106/56, pulse 86, temperature 98.1 F (36.7 C), temperature source Oral, resp. rate 20, weight 104.3 kg, SpO2 99 %.   1. General chronically  ill, in pain  2. Normal affect and insight, Not Suicidal or Homicidal, Awake Alert,   3. No F.N deficits, grossly .  4. Ears and Eyes appear Normal, Conjunctivae clear, PERRLA. Moist Oral Mucosa.  5. Supple Neck, No JVD, No cervical lymphadenopathy appriciated, No Carotid Bruits.  6. Symmetrical Chest wall movement, Good air movement bilaterally, CTAB.  7.  RRR, No Gallops, Rubs or Murmurs, No Parasternal Heave.  8. Positive Bowel Sounds, Abdomen Soft, Non tender, No organomegaly appriciated,  9.  No Cyanosis, Normal Skin Turgor, plus edema.  10. Good muscle tone, left hip tenderness noted.    Data Review  CBC Recent Labs  Lab 04/21/19 1201  WBC 9.7  HGB 9.0*  HCT 29.1*  PLT 175  MCV 98.6  MCH 30.5  MCHC 30.9  RDW 15.4  LYMPHSABS 0.6*  MONOABS 1.9*  EOSABS 0.1  BASOSABS 0.0   ------------------------------------------------------------------------------------------------------------------  Chemistries  Recent Labs  Lab 04/21/19 1201  NA 137  K 4.2  CL 101  CO2 23  GLUCOSE 94  BUN 33*  CREATININE 1.35*  CALCIUM 8.7*  AST 24  ALT 19  ALKPHOS 78  BILITOT 1.7*   ------------------------------------------------------------------------------------------------------------------ estimated creatinine clearance is 62.6 mL/min (A) (by C-G formula based on SCr of 1.35 mg/dL (H)). ------------------------------------------------------------------------------------------------------------------ No results for input(s): TSH, T4TOTAL, T3FREE, THYROIDAB in the last 72 hours.  Invalid input(s): FREET3   Coagulation profile No results for input(s): INR, PROTIME in the last 168 hours. ------------------------------------------------------------------------------------------------------------------- No results for input(s): DDIMER in the last 72  hours. -------------------------------------------------------------------------------------------------------------------  Cardiac Enzymes No results for input(s): CKMB, TROPONINI, MYOGLOBIN in the last 168 hours.  Invalid input(s): CK ------------------------------------------------------------------------------------------------------------------ Invalid input(s): POCBNP   ---------------------------------------------------------------------------------------------------------------  Urinalysis    Component Value Date/Time   COLORURINE YELLOW  12/06/2018 1826   APPEARANCEUR CLEAR 12/06/2018 1826   LABSPEC 1.013 12/06/2018 1826   PHURINE 6.0 12/06/2018 1826   GLUCOSEU NEGATIVE 12/06/2018 1826   HGBUR NEGATIVE 12/06/2018 1826   BILIRUBINUR NEGATIVE 12/06/2018 1826   BILIRUBINUR Neg 11/27/2017 1032   KETONESUR NEGATIVE 12/06/2018 1826   PROTEINUR NEGATIVE 12/06/2018 1826   UROBILINOGEN 0.2 11/27/2017 1032   UROBILINOGEN 1.0 10/13/2013 1855   NITRITE NEGATIVE 12/06/2018 1826   LEUKOCYTESUR TRACE (A) 12/06/2018 1826    ----------------------------------------------------------------------------------------------------------------     Imaging results:   Ct Angio Chest/abd/pel For Dissection W And/or Wo Contrast  Result Date: 04/21/2019 CLINICAL DATA:  Evaluate for dissection, left flank pain, dark urine EXAM: CT ANGIOGRAPHY CHEST, ABDOMEN AND PELVIS WITH AND WITHOUT CONTRAST TECHNIQUE: Multidetector CT imaging through the chest, abdomen and pelvis was performed. The chest was imaged prior to the administration of intravenous contrast. The chest, abdomen, and pelvis were imaged in the arterial phase following the administration of intravenous contrast. Multiplanar reconstructed images and MIPs were obtained and reviewed to evaluate the vascular anatomy. CONTRAST:  142mL OMNIPAQUE IOHEXOL 350 MG/ML SOLN COMPARISON:  None. FINDINGS: CTA CHEST FINDINGS Cardiovascular: Satisfactory  opacification of the pulmonary arteries to the segmental level. No evidence of pulmonary embolism. Coronary artery calcifications. Pericardial calcifications. Right chest multi lead pacer. Mediastinum/Nodes: No enlarged mediastinal, hilar, or axillary lymph nodes. Thyroid gland, trachea, and esophagus demonstrate no significant findings. Lungs/Pleura: Small left pleural effusion, possibly chronic and loculated, with associated atelectasis or consolidation. Upper Abdomen: No acute abnormality. Musculoskeletal: No chest wall abnormality. No acute or significant osseous findings. Review of the MIP images confirms the above findings. CTA ABDOMEN AND PELVIS FINDINGS VASCULAR Aorta: Moderate to severe mixed calcific atherosclerosis of the aorta. Normal caliber aorta without aneurysm, dissection, vasculitis or significant stenosis. Celiac: Patent without evidence of aneurysm, dissection, vasculitis or significant stenosis. SMA: Patent without evidence of aneurysm, dissection, vasculitis or significant stenosis. Renals: Solitary renal arteries are patent without evidence of aneurysm, dissection, or significant stenosis. IMA: Patent without evidence of aneurysm, dissection, vasculitis or significant stenosis. Inflow: Severe mixed calcific atherosclerosis. Patent without evidence of aneurysm, dissection, vasculitis or significant stenosis. Veins: No obvious venous abnormality within the limitations of this arterial phase study. Review of the MIP images confirms the above findings. NON-VASCULAR Hepatobiliary: No focal liver abnormality is seen. No gallstones, gallbladder wall thickening, or biliary dilatation. Pancreas: Unremarkable. No pancreatic ductal dilatation or surrounding inflammatory changes. Spleen: Normal in size without focal abnormality. Adrenals/Urinary Tract: Adrenal glands are unremarkable. Numerous bilateral renal cysts. No hydronephrosis. Bladder is unremarkable. Stomach/Bowel: Stomach is within normal  limits. Appendix appears normal. No evidence of bowel wall thickening, distention, or inflammatory changes. Vascular/Lymphatic: No significant vascular findings are present. No enlarged abdominal or pelvic lymph nodes. Reproductive: No mass or other abnormality. Other: No abdominal wall hernia or abnormality. No abdominopelvic ascites. There is a large retroperitoneal hematoma of the left psoas muscle body measuring at least 10.6 x 8.9 x 18.2 cm (series 6, image 202, series 10, image 130). Musculoskeletal: No acute or significant osseous findings. Review of the MIP images confirms the above findings. IMPRESSION: 1. There is a large retroperitoneal hematoma of the left psoas muscle body measuring at least 10.6 x 8.9 x 18.2 cm (series 6, image 202, series 10, image 130). 2. Aortic atherosclerosis without evidence of aneurysm, dissection, or other acute aortic syndrome. 3.  Other chronic and incidental findings as detailed above. Electronically Signed   By: Eddie Candle M.D.   On:  04/21/2019 14:08    My personal review of EKG: Rhythm NSR, at 80 bpm with nonspecific intraventricular conduction delay and no acute changes    Assessment & Plan  Retroperitoneal bleed on edoxaban with history of factor VII deficiency. Hold edoxaban. Surgery was consulted and they advised to manage the patient conservatively Monitor H/H in a.m. Pain control . May need to switch to another anticoagulating agent.  History of A. fib, chronic, now in NSR  History of CAD on Imdur  History of chronic systolic congestive heart failure status post echocardiogram on 03/2017 showing ejection fraction of 50-55% status post AICD.  Patient followed by Dr. Zoila Shutter  History of factor VII deficiency with prior DVTs, patient on edoxaban  History of seizure disorders on phenytoin  History of hypertension on losartan  History of hyperlipidemia on Lipitor  History of BPH on Flomax  DVT Prophylaxis edoxaban  AM Labs Ordered,  also please review Full Orders    Code Status full  Disposition Plan: Home  Time spent in minutes : 42 minutes  Condition GUARDED   @SIGNATURE @

## 2019-04-21 NOTE — ED Notes (Signed)
Condom Cath placed on pt. 

## 2019-04-21 NOTE — ED Notes (Signed)
ED TO INPATIENT HANDOFF REPORT  ED Nurse Name and Phone #: Viren Lebeau [phone]  S Name/Age/Gender Louis Ford. 73 y.o. male Room/Bed: 034C/034C  Code Status   Code Status: Prior  Home/SNF/Other Home Patient oriented to: self, place, time and situation Is this baseline? Yes   Triage Complete: Triage complete  Chief Complaint Altered  Triage Note Pt brought in by ems from home for c/o aloc on and off x [redacted] week along with left sided flank pain  and dark urine that began a couple of days ago; pt denies any dysuria ; no cough no fevers ; pt alert and oriented x 4  And following simple commands  ; pt was getting prednisone from pcp for left hip pain    Allergies No Known Allergies  Level of Care/Admitting Diagnosis ED Disposition    ED Disposition Condition [place]: [place] [[id number]]  Level of Care: Telemetry Medical [104]  I expect the patient will be discharged within 24 hours: Yes  LOW acuity---Tx typically complete <24 hrs---ACUTE conditions typically can be evaluated <24 hours---LABS likely to return to acceptable levels <24 hours---IS near functional baseline---EXPECTED to return to current living arrangement---NOT newly hypoxic: Meets criteria for 5C-Observation unit  Covid Evaluation: N/A  Diagnosis: Retroperitoneal bleed [290513]  Admitting Physician: Laren Everts, [place]  Attending Physician: Laren Everts, ALI Marshal.[place]  PT Class (Do Not Modify): Observation [104]  PT Acc Code (Do Not Modify): Observation [10022]       B Medical/Surgery History Past Medical History:  Diagnosis Date  . AICD (automatic cardioverter/defibrillator) present   . Atrial fibrillation   09/22/2012  . Blood loss anemia 04/18/2017   After GI bleed from colonoscopy and polypectomy  . CAD (coronary artery disease)   . Chronic systolic heart failure ([place])   . 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 GIB (gastrointestinal  bleeding) 02/20/2017  . History of MRSA infection 05/2011  . HTN (hypertension)   . Hx of adenomatous colonic polyps 02/20/2017   01/2017 - 3 cm sigmoid TV adenoma and other smaller polyps - had post-polypectomy bleed Tx w/ clips Consider repeat colonoscopy 3 yrs [name], MD, [name]   . Hyperlipidemia   . implantable cardiac defibrillator-Biotronik    Device Implanted 2006; s/p gen change 03/2011 : bleeding persistent with pocket erosion and infection; explant and reimplant  06/2011  . Ischemic cardiomyopathy    EF 15 to 20% by TTE and TEE in 09/2012.  Severe LV dysfunction  . Obesity    BMI 31 in 09/2012  . Persistent atrial fibrillation with rapid ventricular response 04/21/2014  . Seizure disorder ([place]) latest 09/30/2012  . Seizures ([place])   . Stroke [facility])    Past Surgical History:  Procedure Laterality Date  . CARDIAC CATHETERIZATION N/A 10/12/2015   Procedure: Right Heart Cath;  Surgeon: [name], MD;  Location: [place] CV LAB;  Service: Cardiovascular;  Laterality: N/A;  . CARDIOVERSION  09/24/2012   Procedure: CARDIOVERSION;  Surgeon: [name], MD;  Location: [facility] ENDOSCOPY;  Service: Cardiovascular;  Laterality: N/A;  . CARDIOVERSION N/A 12/25/2017   Procedure: CARDIOVERSION;  Surgeon: [name], MD;  Location: [facility] ORS;  Service: Cardiovascular;  Laterality: N/A;  . COLONOSCOPY W/ POLYPECTOMY  02/13/2017  . CORONARY ARTERY BYPASS GRAFT  2011   in [place]  . FLEXIBLE SIGMOIDOSCOPY N/A 02/20/2017   Procedure: FLEXIBLE SIGMOIDOSCOPY;  Surgeon: [name], MD;  Location: [facility] ENDOSCOPY;  Service: Endoscopy;  Laterality: N/A;  . FLEXIBLE SIGMOIDOSCOPY N/A 02/21/2017   Procedure: FLEXIBLE SIGMOIDOSCOPY;  Surgeon: [name], MD;  Location: [facility] ENDOSCOPY;  Service: Endoscopy;  Laterality: N/A;  . ICD    . TEE WITHOUT CARDIOVERSION  09/24/2012   Procedure: TRANSESOPHAGEAL ECHOCARDIOGRAM (TEE);  Surgeon: [name], MD;  Location: [place];  Service:  Cardiovascular;  Laterality: N/A;  dave/anesth, dl, cindy/echo      A IV Location/Drains/Wounds Patient Lines/Drains/Airways Status   Active Line/Drains/Airways    Name:   Placement date:   Placement time:   Site:   Days:   Peripheral IV 04/21/19 Right Antecubital   04/21/19    -    Antecubital   less than 1   External Urinary Catheter   12/20/17    1800    -   487          Intake/Output Last 24 hours  Intake/Output Summary (Last 24 hours) at 04/21/2019 1528 Last data filed at 04/21/2019 1447 Gross per 24 hour  Intake 500 ml  Output -  Net 500 ml    Labs/Imaging Results for orders placed or performed during the hospital encounter of 04/21/19 (from the past 48 hour(s))  Comprehensive metabolic panel     Status: Abnormal   Collection Time: 04/21/19 12:01 PM  Result Value Ref Range   Sodium 137 135 - 145 mmol/L   Potassium 4.2 3.5 - 5.1 mmol/L   Chloride 101 98 - 111 mmol/L   CO2 23 22 - 32 mmol/L   Glucose, Bld 94 70 - 99 mg/dL   BUN 33 (H) 8 - 23 mg/dL   Creatinine, Ser 1.35 (H) 0.61 - 1.24 mg/dL   Calcium 8.7 (L) 8.9 - 10.3 mg/dL   Total Protein 7.2 6.5 - 8.1 g/dL   Albumin 2.8 (L) 3.5 - 5.0 g/dL   AST 24 15 - 41 U/L   ALT 19 0 - 44 U/L   Alkaline Phosphatase 78 38 - 126 U/L   Total Bilirubin 1.7 (H) 0.3 - 1.2 mg/dL   GFR calc non Af Amer 53 (L) >60 mL/min   GFR calc Af Amer >60 >60 mL/min   Anion gap 13 5 - 15    Comment: Performed at [facility], [address], [place], [place] [address]  CBC with Differential     Status: Abnormal   Collection Time: 04/21/19 12:01 PM  Result Value Ref Range   WBC 9.7 4.0 - 10.5 K/uL   RBC 2.95 (L) 4.22 - 5.81 MIL/uL   Hemoglobin 9.0 (L) 13.0 - 17.0 g/dL   HCT 29.1 (L) 39.0 - 52.0 %   MCV 98.6 80.0 - 100.0 fL   MCH 30.5 26.0 - 34.0 pg   MCHC 30.9 30.0 - 36.0 g/dL   RDW 15.4 11.5 - 15.5 %   Platelets 175 150 - 400 K/uL   nRBC 0.6 (H) 0.0 - 0.2 %   Neutrophils Relative % 73 %   Neutro Abs 7.1 1.7 - 7.7 K/uL    Lymphocytes Relative 6 %   Lymphs Abs 0.6 (L) 0.7 - 4.0 K/uL   Monocytes Relative 19 %   Monocytes Absolute 1.9 (H) 0.1 - 1.0 K/uL   Eosinophils Relative 1 %   Eosinophils Absolute 0.1 0.0 - 0.5 K/uL   Basophils Relative 0 %   Basophils Absolute 0.0 0.0 - 0.1 K/uL   Immature Granulocytes 1 %   Abs Immature Granulocytes 0.09 (  H) 0.00 - 0.07 K/uL    Comment: Performed at [facility], [place] [address], [place], [place] [address]  CBG monitoring, ED     Status: Abnormal   Collection Time: 04/21/19 12:01 PM  Result Value Ref Range   Glucose-Capillary 68 (L) 70 - 99 mg/dL  CBG monitoring, ED     Status: None   Collection Time: 04/21/19  1:09 PM  Result Value Ref Range   Glucose-Capillary 70 70 - 99 mg/dL   Ct Angio Chest/abd/pel For Dissection W And/or Wo Contrast  Result Date: 04/21/2019 CLINICAL DATA:  Evaluate for dissection, left flank pain, dark urine EXAM: CT ANGIOGRAPHY CHEST, ABDOMEN AND PELVIS WITH AND WITHOUT CONTRAST TECHNIQUE: Multidetector CT imaging through the chest, abdomen and pelvis was performed. The chest was imaged prior to the administration of intravenous contrast. The chest, abdomen, and pelvis were imaged in the arterial phase following the administration of intravenous contrast. Multiplanar reconstructed images and MIPs were obtained and reviewed to evaluate the vascular anatomy. CONTRAST:  121mL OMNIPAQUE IOHEXOL 350 MG/ML SOLN COMPARISON:  None. FINDINGS: CTA CHEST FINDINGS Cardiovascular: Satisfactory opacification of the pulmonary arteries to the segmental level. No evidence of pulmonary embolism. Coronary artery calcifications. Pericardial calcifications. Right chest multi lead pacer. Mediastinum/Nodes: No enlarged mediastinal, hilar, or axillary lymph nodes. Thyroid gland, trachea, and esophagus demonstrate no significant findings. Lungs/Pleura: Small left pleural effusion, possibly chronic and loculated, with associated atelectasis or consolidation. Upper  Abdomen: No acute abnormality. Musculoskeletal: No chest wall abnormality. No acute or significant osseous findings. Review of the MIP images confirms the above findings. CTA ABDOMEN AND PELVIS FINDINGS VASCULAR Aorta: Moderate to severe mixed calcific atherosclerosis of the aorta. Normal caliber aorta without aneurysm, dissection, vasculitis or significant stenosis. Celiac: Patent without evidence of aneurysm, dissection, vasculitis or significant stenosis. SMA: Patent without evidence of aneurysm, dissection, vasculitis or significant stenosis. Renals: Solitary renal arteries are patent without evidence of aneurysm, dissection, or significant stenosis. IMA: Patent without evidence of aneurysm, dissection, vasculitis or significant stenosis. Inflow: Severe mixed calcific atherosclerosis. Patent without evidence of aneurysm, dissection, vasculitis or significant stenosis. Veins: No obvious venous abnormality within the limitations of this arterial phase study. Review of the MIP images confirms the above findings. NON-VASCULAR Hepatobiliary: No focal liver abnormality is seen. No gallstones, gallbladder wall thickening, or biliary dilatation. Pancreas: Unremarkable. No pancreatic ductal dilatation or surrounding inflammatory changes. Spleen: Normal in size without focal abnormality. Adrenals/Urinary Tract: Adrenal glands are unremarkable. Numerous bilateral renal cysts. No hydronephrosis. Bladder is unremarkable. Stomach/Bowel: Stomach is within normal limits. Appendix appears normal. No evidence of bowel wall thickening, distention, or inflammatory changes. Vascular/Lymphatic: No significant vascular findings are present. No enlarged abdominal or pelvic lymph nodes. Reproductive: No mass or other abnormality. Other: No abdominal wall hernia or abnormality. No abdominopelvic ascites. There is a large retroperitoneal hematoma of the left psoas muscle body measuring at least 10.6 x 8.9 x 18.2 cm (series 6, image 202,  series 10, image 130). Musculoskeletal: No acute or significant osseous findings. Review of the MIP images confirms the above findings. IMPRESSION: 1. There is a large retroperitoneal hematoma of the left psoas muscle body measuring at least 10.6 x 8.9 x 18.2 cm (series 6, image 202, series 10, image 130). 2. Aortic atherosclerosis without evidence of aneurysm, dissection, or other acute aortic syndrome. 3.  Other chronic and incidental findings as detailed above. Electronically Signed   By: Eddie Candle M.D.   On: 04/21/2019 14:08    Pending Labs  Unresulted Labs (From admission, onward)    Start     Ordered   04/21/19 1201  Urinalysis, Routine w reflex microscopic  ONCE - STAT,   STAT     04/21/19 1200   04/21/19 1201  Urine culture  ONCE - STAT,   STAT     04/21/19 1200   Signed and Held  HIV antibody (Routine Testing)  Once,   R     Signed and Held   Signed and Held  CBC  Tomorrow morning,   R     Signed and Held          Vitals/Pain Today's Vitals   04/21/19 1201 04/21/19 1300 04/21/19 1316 04/21/19 1457  BP:  115/81  (!) 106/56  Pulse:  90  86  Resp:  17  20  Temp:      TempSrc:      SpO2:  98%  99%  Weight: 104.3 kg     PainSc: 8   4      Isolation Precautions No active isolations  Medications Medications  sodium chloride 0.9 % bolus 500 mL (0 mLs Intravenous Stopped 04/21/19 1447)  iohexol (OMNIPAQUE) 350 MG/ML injection 100 mL (100 mLs Intravenous Contrast Given 04/21/19 1352)  acetaminophen (TYLENOL) tablet 650 mg (650 mg Oral Given 04/21/19 1451)    Mobility walks with device Low fall risk   Focused Assessments Cardiac Assessment Handoff:    Lab Results  Component Value Date   CKTOTAL 40 10/25/2017   CKMB 6.3 (HH) 09/22/2012   TROPONINI <0.03 02/27/2016   Lab Results  Component Value Date   DDIMER 0.88 (H) 10/04/2015   Does the Patient currently have chest pain? No     R Recommendations: See Admitting Provider Note  Report given to:    Additional Notes:

## 2019-04-22 DIAGNOSIS — Z7901 Long term (current) use of anticoagulants: Secondary | ICD-10-CM | POA: Diagnosis not present

## 2019-04-22 DIAGNOSIS — N400 Enlarged prostate without lower urinary tract symptoms: Secondary | ICD-10-CM | POA: Diagnosis present

## 2019-04-22 DIAGNOSIS — Z87891 Personal history of nicotine dependence: Secondary | ICD-10-CM | POA: Diagnosis not present

## 2019-04-22 DIAGNOSIS — N183 Chronic kidney disease, stage 3 (moderate): Secondary | ICD-10-CM | POA: Diagnosis present

## 2019-04-22 DIAGNOSIS — Z86010 Personal history of colonic polyps: Secondary | ICD-10-CM | POA: Diagnosis not present

## 2019-04-22 DIAGNOSIS — Z95810 Presence of automatic (implantable) cardiac defibrillator: Secondary | ICD-10-CM | POA: Diagnosis not present

## 2019-04-22 DIAGNOSIS — D62 Acute posthemorrhagic anemia: Secondary | ICD-10-CM | POA: Diagnosis not present

## 2019-04-22 DIAGNOSIS — N390 Urinary tract infection, site not specified: Secondary | ICD-10-CM | POA: Diagnosis present

## 2019-04-22 DIAGNOSIS — Z86718 Personal history of other venous thrombosis and embolism: Secondary | ICD-10-CM | POA: Diagnosis not present

## 2019-04-22 DIAGNOSIS — E669 Obesity, unspecified: Secondary | ICD-10-CM | POA: Diagnosis present

## 2019-04-22 DIAGNOSIS — I10 Essential (primary) hypertension: Secondary | ICD-10-CM | POA: Diagnosis not present

## 2019-04-22 DIAGNOSIS — D682 Hereditary deficiency of other clotting factors: Secondary | ICD-10-CM | POA: Diagnosis present

## 2019-04-22 DIAGNOSIS — K661 Hemoperitoneum: Secondary | ICD-10-CM | POA: Diagnosis present

## 2019-04-22 DIAGNOSIS — R58 Hemorrhage, not elsewhere classified: Secondary | ICD-10-CM | POA: Diagnosis not present

## 2019-04-22 DIAGNOSIS — I2510 Atherosclerotic heart disease of native coronary artery without angina pectoris: Secondary | ICD-10-CM | POA: Diagnosis present

## 2019-04-22 DIAGNOSIS — K029 Dental caries, unspecified: Secondary | ICD-10-CM | POA: Diagnosis present

## 2019-04-22 DIAGNOSIS — I4819 Other persistent atrial fibrillation: Secondary | ICD-10-CM | POA: Diagnosis present

## 2019-04-22 DIAGNOSIS — Z6832 Body mass index (BMI) 32.0-32.9, adult: Secondary | ICD-10-CM | POA: Diagnosis not present

## 2019-04-22 DIAGNOSIS — G40909 Epilepsy, unspecified, not intractable, without status epilepticus: Secondary | ICD-10-CM | POA: Diagnosis present

## 2019-04-22 DIAGNOSIS — I130 Hypertensive heart and chronic kidney disease with heart failure and stage 1 through stage 4 chronic kidney disease, or unspecified chronic kidney disease: Secondary | ICD-10-CM | POA: Diagnosis present

## 2019-04-22 DIAGNOSIS — Z951 Presence of aortocoronary bypass graft: Secondary | ICD-10-CM | POA: Diagnosis not present

## 2019-04-22 DIAGNOSIS — I255 Ischemic cardiomyopathy: Secondary | ICD-10-CM | POA: Diagnosis present

## 2019-04-22 DIAGNOSIS — Z79899 Other long term (current) drug therapy: Secondary | ICD-10-CM | POA: Diagnosis not present

## 2019-04-22 DIAGNOSIS — Z8614 Personal history of Methicillin resistant Staphylococcus aureus infection: Secondary | ICD-10-CM | POA: Diagnosis not present

## 2019-04-22 DIAGNOSIS — E785 Hyperlipidemia, unspecified: Secondary | ICD-10-CM | POA: Diagnosis not present

## 2019-04-22 DIAGNOSIS — I5022 Chronic systolic (congestive) heart failure: Secondary | ICD-10-CM | POA: Diagnosis not present

## 2019-04-22 LAB — CBC
HCT: 25.7 % — ABNORMAL LOW (ref 39.0–52.0)
Hemoglobin: 8.3 g/dL — ABNORMAL LOW (ref 13.0–17.0)
MCH: 30.6 pg (ref 26.0–34.0)
MCHC: 32.3 g/dL (ref 30.0–36.0)
MCV: 94.8 fL (ref 80.0–100.0)
Platelets: 187 10*3/uL (ref 150–400)
RBC: 2.71 MIL/uL — ABNORMAL LOW (ref 4.22–5.81)
RDW: 15.5 % (ref 11.5–15.5)
WBC: 8.8 10*3/uL (ref 4.0–10.5)
nRBC: 0.6 % — ABNORMAL HIGH (ref 0.0–0.2)

## 2019-04-22 LAB — HIV ANTIBODY (ROUTINE TESTING W REFLEX): HIV Screen 4th Generation wRfx: NONREACTIVE

## 2019-04-22 NOTE — Progress Notes (Signed)
PROGRESS NOTE    [facility].  QAS:[id number] DOB: Mar 04, 1946 DOA: 04/21/2019 PCP: [name], MD    Brief Narrative:  Patient is 73 year old male with history of chronic atrial fibrillation on edoxaban,25 minutes chronic systolic congestive heart failure status post AICD, presented to the emergency room with few days of confusion and left hip pain.  Patient had been getting prednisone and pain medications as outpatient.  Denied any history of trauma.  Patient continued to have pain.  Came to the emergency room and he was found to have large retroperitoneal hematoma of the left psoas muscle measuring 10 x 18 cm.  Hemoglobin baseline was 16.  Today her his hemoglobin is 8.2.  Last dose of edoxaban was 04/21/2019.   Assessment & Plan:   Active Problems:   Retroperitoneal bleed  Spontaneous retroperitoneal bleeding on anticoagulation therapy: Anticoagulation discontinued.  Surgery was consulted who suggested conservative management.  Hemoglobin dropped by one-point and has remained stable since then.  Patient is otherwise hemodynamically stable.  We will recheck levels tomorrow.  Start ambulating.  Currently no indication for reversal.  Acute blood loss anemia: Baseline hemoglobin 16.  9-8.3 today.  Patient is fairly stable.  Recheck levels tomorrow morning.  Currently no indication for transfusion.  Chronic atrial fibrillation: Currently sinus rhythm.  Rate controlled.Patient is on amiodarone and Coreg that he will continue.  History of coronary artery disease: Currently stable.  Denies any chest pain.  Chronic systolic congestive heart failure status post AICD: Stable.  Compensated.  History of hypertension: Fairly stable.  Seizure disorder: Patient on phenytoin and Keppra.  No recurrent seizure.  Factor VII deficiency with prior DVTs: Difficult decision regarding anticoagulation.  Patient has life-threatening bleeding causing significant drop in hemoglobin.  At this  time will stop all his anticoagulation for 2 weeks.  Will need reevaluation as outpatient before resuming anticoagulation.  Patient continues to need inpatient hospitalization and monitoring given severity of symptoms.  Will need recheck of hemoglobin tomorrow morning.   DVT prophylaxis: SCDs Code Status: Full code Family Communication: Wife updated on the phone Disposition Plan: Inpatient Work with PT OT tomorrow with for discharge home.   Consultants:   Surgery.  Signed up  Procedures:   None  Antimicrobials:   None   Subjective: Patient was seen and examined.  Poor historian.  Complains of left flank pain about 8 out of 10.  No difficulty with urination.  Denies any fever, difficulty urination or bowel movement.  Objective: Vitals:   04/21/19 2128 04/21/19 2205 04/22/19 0523 04/22/19 1410  BP: 109/67 (!) 109/97 102/65 90/61  Pulse: 84  86 87  Resp: 20  18   Temp: 98.2 F (36.8 C)  98.8 F (37.1 C) 98.6 F (37 C)  TempSrc: Oral  Oral Oral  SpO2: 100%  98% 100%  Weight:      Height:        Intake/Output Summary (Last 24 hours) at 04/22/2019 1458 Last data filed at 04/22/2019 1300 Gross per 24 hour  Intake 633.86 ml  Output 1450 ml  Net -816.14 ml   Filed Weights   04/21/19 1201 04/21/19 1600  Weight: 104.3 kg 92.5 kg    Examination:  General exam: Appears calm and comfortable, slightly anxious. Respiratory system: Clear to auscultation. Respiratory effort normal. Cardiovascular system: S1 & S2 heard, RRR. No JVD, murmurs, rubs, gallops or clicks. No pedal edema. Gastrointestinal system: Abdomen is nondistended, soft and nontender. No organomegaly or masses felt. Normal bowel sounds heard.  Does not have any external swelling or hematoma.  Patient does complain of left flank pain. Central nervous system: Alert and oriented. No focal neurological deficits. Extremities: Symmetric 5 x 5 power. Skin: No rashes, lesions or ulcers Psychiatry: Judgement and  insight appear normal. Mood & affect appropriate.     Data Reviewed: I have personally reviewed following labs and imaging studies  CBC: Recent Labs  Lab 04/21/19 1201 04/22/19 0238  WBC 9.7 8.8  NEUTROABS 7.1  --   HGB 9.0* 8.3*  HCT 29.1* 25.7*  MCV 98.6 94.8  PLT 175 347   Basic Metabolic Panel: Recent Labs  Lab 04/21/19 1201  NA 137  K 4.2  CL 101  CO2 23  GLUCOSE 94  BUN 33*  CREATININE 1.35*  CALCIUM 8.7*   GFR: Estimated Creatinine Clearance: 59.2 mL/min (A) (by C-G formula based on SCr of 1.35 mg/dL (H)). Liver Function Tests: Recent Labs  Lab 04/21/19 1201  AST 24  ALT 19  ALKPHOS 78  BILITOT 1.7*  PROT 7.2  ALBUMIN 2.8*   No results for input(s): LIPASE, AMYLASE in the last 168 hours. No results for input(s): AMMONIA in the last 168 hours. Coagulation Profile: No results for input(s): INR, PROTIME in the last 168 hours. Cardiac Enzymes: No results for input(s): CKTOTAL, CKMB, CKMBINDEX, TROPONINI in the last 168 hours. BNP (last 3 results) Recent Labs    11/26/18 1025  PROBNP 56.0   HbA1C: No results for input(s): HGBA1C in the last 72 hours. CBG: Recent Labs  Lab 04/21/19 1201 04/21/19 1309  GLUCAP 68* 70   Lipid Profile: No results for input(s): CHOL, HDL, LDLCALC, TRIG, CHOLHDL, LDLDIRECT in the last 72 hours. Thyroid Function Tests: No results for input(s): TSH, T4TOTAL, FREET4, T3FREE, THYROIDAB in the last 72 hours. Anemia Panel: No results for input(s): VITAMINB12, FOLATE, FERRITIN, TIBC, IRON, RETICCTPCT in the last 72 hours. Sepsis Labs: No results for input(s): PROCALCITON, LATICACIDVEN in the last 168 hours.  Recent Results (from the past 240 hour(s))  Urine culture     Status: Abnormal (Preliminary result)   Collection Time: 04/21/19  3:00 PM  Result Value Ref Range Status   Specimen Description URINE, CLEAN CATCH  Final   Special Requests   Final    NONE Performed at [facility], [address],  [place], [place] [address]    Culture >=100,000 COLONIES/mL SERRATIA MARCESCENS (A)  Final   Report Status PENDING  Incomplete         Radiology Studies: Ct Angio Chest/abd/pel For Dissection W And/or Wo Contrast  Result Date: 04/21/2019 CLINICAL DATA:  Evaluate for dissection, left flank pain, dark urine EXAM: CT ANGIOGRAPHY CHEST, ABDOMEN AND PELVIS WITH AND WITHOUT CONTRAST TECHNIQUE: Multidetector CT imaging through the chest, abdomen and pelvis was performed. The chest was imaged prior to the administration of intravenous contrast. The chest, abdomen, and pelvis were imaged in the arterial phase following the administration of intravenous contrast. Multiplanar reconstructed images and MIPs were obtained and reviewed to evaluate the vascular anatomy. CONTRAST:  121mL OMNIPAQUE IOHEXOL 350 MG/ML SOLN COMPARISON:  None. FINDINGS: CTA CHEST FINDINGS Cardiovascular: Satisfactory opacification of the pulmonary arteries to the segmental level. No evidence of pulmonary embolism. Coronary artery calcifications. Pericardial calcifications. Right chest multi lead pacer. Mediastinum/Nodes: No enlarged mediastinal, hilar, or axillary lymph nodes. Thyroid gland, trachea, and esophagus demonstrate no significant findings. Lungs/Pleura: Small left pleural effusion, possibly chronic and loculated, with associated atelectasis or consolidation. Upper Abdomen: No acute abnormality.  Musculoskeletal: No chest wall abnormality. No acute or significant osseous findings. Review of the MIP images confirms the above findings. CTA ABDOMEN AND PELVIS FINDINGS VASCULAR Aorta: Moderate to severe mixed calcific atherosclerosis of the aorta. Normal caliber aorta without aneurysm, dissection, vasculitis or significant stenosis. Celiac: Patent without evidence of aneurysm, dissection, vasculitis or significant stenosis. SMA: Patent without evidence of aneurysm, dissection, vasculitis or significant stenosis. Renals: Solitary renal  arteries are patent without evidence of aneurysm, dissection, or significant stenosis. IMA: Patent without evidence of aneurysm, dissection, vasculitis or significant stenosis. Inflow: Severe mixed calcific atherosclerosis. Patent without evidence of aneurysm, dissection, vasculitis or significant stenosis. Veins: No obvious venous abnormality within the limitations of this arterial phase study. Review of the MIP images confirms the above findings. NON-VASCULAR Hepatobiliary: No focal liver abnormality is seen. No gallstones, gallbladder wall thickening, or biliary dilatation. Pancreas: Unremarkable. No pancreatic ductal dilatation or surrounding inflammatory changes. Spleen: Normal in size without focal abnormality. Adrenals/Urinary Tract: Adrenal glands are unremarkable. Numerous bilateral renal cysts. No hydronephrosis. Bladder is unremarkable. Stomach/Bowel: Stomach is within normal limits. Appendix appears normal. No evidence of bowel wall thickening, distention, or inflammatory changes. Vascular/Lymphatic: No significant vascular findings are present. No enlarged abdominal or pelvic lymph nodes. Reproductive: No mass or other abnormality. Other: No abdominal wall hernia or abnormality. No abdominopelvic ascites. There is a large retroperitoneal hematoma of the left psoas muscle body measuring at least 10.6 x 8.9 x 18.2 cm (series 6, image 202, series 10, image 130). Musculoskeletal: No acute or significant osseous findings. Review of the MIP images confirms the above findings. IMPRESSION: 1. There is a large retroperitoneal hematoma of the left psoas muscle body measuring at least 10.6 x 8.9 x 18.2 cm (series 6, image 202, series 10, image 130). 2. Aortic atherosclerosis without evidence of aneurysm, dissection, or other acute aortic syndrome. 3.  Other chronic and incidental findings as detailed above. Electronically Signed   By: Eddie Candle M.D.   On: 04/21/2019 14:08        Scheduled Meds:   amiodarone  200 mg Oral Daily   carvedilol  12.5 mg Oral BID WC   divalproex  500 mg Oral Daily   DULoxetine  30 mg Oral Daily   furosemide  40 mg Oral Daily   hydrALAZINE  25 mg Oral TID   isosorbide mononitrate  30 mg Oral Daily   levETIRAcetam  1,000 mg Oral BID   losartan  25 mg Oral Daily   phenytoin  100 mg Oral BID   predniSONE  40 mg Oral Q breakfast   senna-docusate  2 tablet Oral BID   simvastatin  20 mg Oral q1800   tamsulosin  0.4 mg Oral Daily   Continuous Infusions:  sodium chloride 50 mL/hr at 04/22/19 1204     LOS: 0 days    Time spent: 25 minutes    Barb Merino, MD [facility] Pager [phone]  If 7PM-7AM, please contact night-coverage [email or web address] Password [facility] 04/22/2019, 2:58 PM

## 2019-04-22 NOTE — Progress Notes (Signed)
Wife called me and I updated her on pt progress.  She states he used cotton balls in his mouth for his poor dentition and nystatin mouthwash at home.

## 2019-04-23 DIAGNOSIS — I10 Essential (primary) hypertension: Secondary | ICD-10-CM

## 2019-04-23 DIAGNOSIS — E785 Hyperlipidemia, unspecified: Secondary | ICD-10-CM

## 2019-04-23 DIAGNOSIS — D682 Hereditary deficiency of other clotting factors: Secondary | ICD-10-CM

## 2019-04-23 LAB — URINALYSIS, ROUTINE W REFLEX MICROSCOPIC
Bilirubin Urine: NEGATIVE
Glucose, UA: NEGATIVE mg/dL
Ketones, ur: NEGATIVE mg/dL
Nitrite: NEGATIVE
Protein, ur: 30 mg/dL — AB
Specific Gravity, Urine: 1.017 (ref 1.005–1.030)
WBC, UA: >50 WBC/hpf — ABNORMAL HIGH (ref 0–5)
pH: 6 (ref 5.0–8.0)

## 2019-04-23 LAB — CBC
HCT: 25.5 % — ABNORMAL LOW (ref 39.0–52.0)
Hemoglobin: 8.1 g/dL — ABNORMAL LOW (ref 13.0–17.0)
MCH: 30.8 pg (ref 26.0–34.0)
MCHC: 31.8 g/dL (ref 30.0–36.0)
MCV: 97 fL (ref 80.0–100.0)
Platelets: 195 10*3/uL (ref 150–400)
RBC: 2.63 MIL/uL — ABNORMAL LOW (ref 4.22–5.81)
RDW: 15.6 % — ABNORMAL HIGH (ref 11.5–15.5)
WBC: 9.1 10*3/uL (ref 4.0–10.5)
nRBC: 0.8 % — ABNORMAL HIGH (ref 0.0–0.2)

## 2019-04-23 MED ORDER — ISOSORBIDE MONONITRATE ER 30 MG PO TB24
30.0000 mg | ORAL_TABLET | Freq: Every day | ORAL | Status: DC
  Administered 2019-04-24: 10:00:00 30 mg via ORAL
  Filled 2019-04-23: qty 1

## 2019-04-23 MED ORDER — BENZOCAINE 10 % MT GEL
OROMUCOSAL | Status: DC | PRN
  Administered 2019-04-23: 19:00:00 via OROMUCOSAL
  Filled 2019-04-23: qty 9

## 2019-04-23 NOTE — Progress Notes (Addendum)
Text paged Dr. Sloan Leiter and Dr. Wyonia Hough, 6N32-BP 98/68, HR 89, held cozaar, Lasix, and hydralazine until further order. pt asymptomatic. Awaiting response and interventions from MD

## 2019-04-23 NOTE — Evaluation (Signed)
Physical Therapy Evaluation & Discharge Patient Details Name: [facility]. MRN: [id number] DOB: 11-25-1946 Today's Date: 04/23/2019   History of Present Illness  Pt is a 73 y.o. male admitted 04/21/19 with confusion and L hip pain, denied trauma. Found to have large retroperitoneal hematoma of L psoas; acute blood loss anemia. PMH includes afib (on edoxaban), HF s/p AICD, Factor VII deficiency with prior DVTs, seizure disorder.    Clinical Impression  Patient evaluated by Physical Therapy with no further acute PT needs identified. PTA, pt mod indep with SPC and lives with wife. Today, pt ambulating well with RW; supervision for safety. All education has been completed and the patient has no further questions. Encouraged continued ambulation with nursing staff. Acute PT is signing off. Thank you for this referral.    Follow Up Recommendations No PT follow up;Supervision for mobility/OOB    Equipment Recommendations  None recommended by PT    Recommendations for Other Services       Precautions / Restrictions Precautions Precautions: Fall Precaution Comments: L psoas hematoma Restrictions Weight Bearing Restrictions: No      Mobility  Bed Mobility Overal bed mobility: Independent                Transfers Overall transfer level: Modified independent Equipment used: Rolling walker (2 wheeled)             General transfer comment: Reliant on momentum to power into standing  Ambulation/Gait Ambulation/Gait assistance: Supervision Gait Distance (Feet): 220 Feet Assistive device: Rolling walker (2 wheeled) Gait Pattern/deviations: Step-through pattern;Decreased stride length;Trunk flexed Gait velocity: Decreased Gait velocity interpretation: 1.31 - 2.62 ft/sec, indicative of limited community ambulator General Gait Details: Steady gait with RW and supervision for safety. Intermittent cues to maintain upright posture. 3x standing rest breaks due to fatigue; pt  demonstrates good ability to self-monitor activity tolerance  Stairs            Wheelchair Mobility    Modified Rankin (Stroke Patients Only)       Balance Overall balance assessment: Needs assistance   Sitting balance-Leahy Scale: Good       Standing balance-Leahy Scale: Fair Standing balance comment: Can static stand without UE support                             Pertinent Vitals/Pain Pain Assessment: Faces Faces Pain Scale: Hurts a little bit Pain Location: L-side from arm down through leg Pain Descriptors / Indicators: Sore Pain Intervention(s): Limited activity within patient's tolerance    Home Living Family/patient expects to be discharged to:: Private residence Living Arrangements: Spouse/significant other Available Help at Discharge: Family Type of Home: Apartment Home Access: Level entry     Home Layout: One level Home Equipment: Cane - single point;Walker - 2 wheels      Prior Function Level of Independence: Independent with assistive device(s)   Gait / Transfers Assistance Needed: Reports mod indep with SPC  ADL's / Homemaking Assistance Needed: Reports indep with ADLs. Wife assists with household tasks        Hand Dominance        Extremity/Trunk Assessment   Upper Extremity Assessment Upper Extremity Assessment: Overall WFL for tasks assessed    Lower Extremity Assessment Lower Extremity Assessment: Overall WFL for tasks assessed       Communication      Cognition Arousal/Alertness: Awake/alert Behavior During Therapy: WFL for tasks assessed/performed Overall Cognitive Status: Within Functional Limits  for tasks assessed                                        General Comments      Exercises     Assessment/Plan    PT Assessment Patent does not need any further PT services  PT Problem List         PT Treatment Interventions      PT Goals (Current goals can be found in the Care Plan  section)  Acute Rehab PT Goals PT Goal Formulation: All assessment and education complete, DC therapy    Frequency     Barriers to discharge        Co-evaluation               AM-PAC PT "6 Clicks" Mobility  Outcome Measure Help needed turning from your back to your side while in a flat bed without using bedrails?: None Help needed moving from lying on your back to sitting on the side of a flat bed without using bedrails?: None Help needed moving to and from a bed to a chair (including a wheelchair)?: None Help needed standing up from a chair using your arms (e.g., wheelchair or bedside chair)?: A Little Help needed to walk in hospital room?: A Little Help needed climbing 3-5 steps with a railing? : A Little 6 Click Score: 21    End of Session   Activity Tolerance: Patient tolerated treatment well Patient left: in chair;with call bell/phone within reach Nurse Communication: Mobility status PT Visit Diagnosis: Other abnormalities of gait and mobility (R26.89)    Time: 5883-2549 PT Time Calculation (min) (ACUTE ONLY): 29 min   Charges:   PT Evaluation $PT Eval Moderate Complexity: 1 Mod PT Treatments $Gait Training: 8-22 mins      Mabeline Caras, PT, DPT Acute Rehabilitation Services  Pager [phone] Office [place] 04/23/2019, 11:28 AM

## 2019-04-23 NOTE — Progress Notes (Signed)
Nursing Note:  Patient had a scratch to his left forearm from a fall that occurred prior to admission.  Applied a foam dressing to open but healing scratch.

## 2019-04-23 NOTE — Progress Notes (Signed)
OT Cancellation Note  Patient Details Name: [facility]. MRN: [id number] DOB: [birth year]   Cancelled Treatment:    Reason Eval/Treat Not Completed: OT screened, no needs identified, will sign off  [place], Thereasa Parkin 04/23/2019, 12:14 PM

## 2019-04-23 NOTE — Progress Notes (Addendum)
Talked with Dr. Wyonia Hough due to patients low BP. MD discontinued lasix, increased IV fluids, ordered UA, and to HOLD any BP meds if systolic is lower than 110

## 2019-04-23 NOTE — Progress Notes (Signed)
PROGRESS NOTE    [facility].  ZJQ:[id number] DOB: Jul 07, 1946 DOA: 04/21/2019 PCP: [name], MD   Brief Narrative:  Patient is 73 year old male with history of chronic atrial fibrillation on edoxaban,25 minutes chronic systolic congestive heart failure status post AICD, presented to the emergency room with few days of confusion and left hip pain.  Patient had been getting prednisone and pain medications as outpatient.  Denied any history of trauma.  Patient continued to have pain.  Came to the emergency room and he was found to have large retroperitoneal hematoma of the left psoas muscle measuring 10 x 18 cm.  Hemoglobin baseline was 16.  Today her his hemoglobin is 8.2.  Last dose of edoxaban was 04/21/2019.   Assessment & Plan:   Active Problems:   Retroperitoneal bleed   Spontaneous retroperitoneal bleeding on anticoagulation therapy: Anticoagulation remains discontinued.    As previously noted, surgery was consulted who suggested conservative management.  Hemoglobin dropped by one-point and has remained stable since then, currently 8.1.  Patient is otherwise hemodynamically stable.  Continue ambulating with PT, follow hemoglobins, there is no reversal for edoxaban.  Acute blood loss anemia: Baseline hemoglobin 16,  8.1 today.  Patient is fairly stable.  Recheck levels tomorrow morning.  Currently no indication for transfusion.  Chronic atrial fibrillation: Currently sinus rhythm.  Rate controlled.Patient is on amiodarone and Coreg that he will continue, no changes  History of coronary artery disease: Currently stable.  Denies any chest pain.  Chronic systolic congestive heart failure status post AICD: Stable.  Compensated.  History of hypertension: Fairly stable.  Seizure disorder: Patient on phenytoin and Keppra.  No recurrent seizure.  Factor VII deficiency with prior DVTs: Difficult decision regarding anticoagulation.  Patient has life-threatening  bleeding causing significant drop in hemoglobin.  At this time will stop all his anticoagulation for 2 weeks.  Will need reevaluation as outpatient before resuming anticoagulation.  Patient continues to need inpatient hospitalization and monitoring given severity of symptoms.  Will need recheck of hemoglobin tomorrow morning.  DVT prophylaxis: SCD/Compression stockings  Code Status: FULL    Code Status Orders  (From admission, onward)         Start     Ordered   04/21/19 1601  Full code  Continuous     04/21/19 1601        Code Status History    Date Active Date Inactive Code Status Order ID Comments User Context   12/20/2017 1829 12/28/2017 2152 Full Code [id number]  Oswald Hillock, MD Inpatient   09/09/2017 0027 09/10/2017 2035 DNR [id number]  Merton Border, MD Inpatient   02/19/2017 2026 02/25/2017 2122 DNR [id number]  Cristal Ford, DO Inpatient   02/27/2016 0019 02/29/2016 1729 DNR [id number]  Allie Bossier, MD ED   02/26/2016 2338 02/27/2016 0019 Full Code [id number]  Allie Bossier, MD ED   10/12/2015 1005 10/20/2015 1735 Full Code [id number]  [name], [name], MD Inpatient   10/04/2015 1531 10/12/2015 1005 Full Code [id number]  Burnell Blanks, MD Inpatient   04/20/2014 1732 04/22/2014 1955 Full Code [id number]  Thompson Grayer, MD ED   09/21/2012 1253 10/10/2012 2241 Full Code [id number]  Carlynn Spry, RN ED    Advance Directive Documentation     Most Recent Value  Type of Advance Directive  Healthcare Power of Attorney  Pre-existing out of facility DNR order (yellow form or pink MOST form)  --  "MOST" Form in Place?  --  Family Communication: Spoke with wife today Disposition Plan:   Patient remained inpatient for continued monitoring given significant retroperitoneal bleed on anticoagulation.  Will require require nursing care, labs and monitoring. Consults called: None Admission status: Inpatient   Consultants:   General surgery  Procedures:  Ct Angio  Chest/abd/pel For Dissection W And/or Wo Contrast  Result Date: 04/21/2019 CLINICAL DATA:  Evaluate for dissection, left flank pain, dark urine EXAM: CT ANGIOGRAPHY CHEST, ABDOMEN AND PELVIS WITH AND WITHOUT CONTRAST TECHNIQUE: Multidetector CT imaging through the chest, abdomen and pelvis was performed. The chest was imaged prior to the administration of intravenous contrast. The chest, abdomen, and pelvis were imaged in the arterial phase following the administration of intravenous contrast. Multiplanar reconstructed images and MIPs were obtained and reviewed to evaluate the vascular anatomy. CONTRAST:  173mL OMNIPAQUE IOHEXOL 350 MG/ML SOLN COMPARISON:  None. FINDINGS: CTA CHEST FINDINGS Cardiovascular: Satisfactory opacification of the pulmonary arteries to the segmental level. No evidence of pulmonary embolism. Coronary artery calcifications. Pericardial calcifications. Right chest multi lead pacer. Mediastinum/Nodes: No enlarged mediastinal, hilar, or axillary lymph nodes. Thyroid gland, trachea, and esophagus demonstrate no significant findings. Lungs/Pleura: Small left pleural effusion, possibly chronic and loculated, with associated atelectasis or consolidation. Upper Abdomen: No acute abnormality. Musculoskeletal: No chest wall abnormality. No acute or significant osseous findings. Review of the MIP images confirms the above findings. CTA ABDOMEN AND PELVIS FINDINGS VASCULAR Aorta: Moderate to severe mixed calcific atherosclerosis of the aorta. Normal caliber aorta without aneurysm, dissection, vasculitis or significant stenosis. Celiac: Patent without evidence of aneurysm, dissection, vasculitis or significant stenosis. SMA: Patent without evidence of aneurysm, dissection, vasculitis or significant stenosis. Renals: Solitary renal arteries are patent without evidence of aneurysm, dissection, or significant stenosis. IMA: Patent without evidence of aneurysm, dissection, vasculitis or significant  stenosis. Inflow: Severe mixed calcific atherosclerosis. Patent without evidence of aneurysm, dissection, vasculitis or significant stenosis. Veins: No obvious venous abnormality within the limitations of this arterial phase study. Review of the MIP images confirms the above findings. NON-VASCULAR Hepatobiliary: No focal liver abnormality is seen. No gallstones, gallbladder wall thickening, or biliary dilatation. Pancreas: Unremarkable. No pancreatic ductal dilatation or surrounding inflammatory changes. Spleen: Normal in size without focal abnormality. Adrenals/Urinary Tract: Adrenal glands are unremarkable. Numerous bilateral renal cysts. No hydronephrosis. Bladder is unremarkable. Stomach/Bowel: Stomach is within normal limits. Appendix appears normal. No evidence of bowel wall thickening, distention, or inflammatory changes. Vascular/Lymphatic: No significant vascular findings are present. No enlarged abdominal or pelvic lymph nodes. Reproductive: No mass or other abnormality. Other: No abdominal wall hernia or abnormality. No abdominopelvic ascites. There is a large retroperitoneal hematoma of the left psoas muscle body measuring at least 10.6 x 8.9 x 18.2 cm (series 6, image 202, series 10, image 130). Musculoskeletal: No acute or significant osseous findings. Review of the MIP images confirms the above findings. IMPRESSION: 1. There is a large retroperitoneal hematoma of the left psoas muscle body measuring at least 10.6 x 8.9 x 18.2 cm (series 6, image 202, series 10, image 130). 2. Aortic atherosclerosis without evidence of aneurysm, dissection, or other acute aortic syndrome. 3.  Other chronic and incidental findings as detailed above. Electronically Signed   By: Eddie Candle M.D.   On: 04/21/2019 14:08     Antimicrobials:   None   Subjective: Patient did well overnight resting comfortably in bed.  Working with physical therapy.  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 Respiratory effort normal. Cardiovascular system: S1 & S2 heard, RRR. No JVD, murmurs, rubs, gallops or clicks. No pedal edema. Gastrointestinal system: Abdomen is nondistended, soft and nontender. No organomegaly or masses felt. Normal bowel sounds heard. Central nervous system: Alert and oriented. No focal neurological deficits. Extremities: Warm well perfused, no edema Skin: No rashes, lesions or ulcers Psychiatry: Judgement and insight impaired. Mood & affect appropriate.     Data Reviewed: I have personally reviewed following labs and imaging studies  CBC: Recent Labs  Lab 04/21/19 1201 04/22/19 0238 04/23/19 0407  WBC 9.7 8.8 9.1  NEUTROABS 7.1  --   --   HGB 9.0* 8.3* 8.1*  HCT 29.1* 25.7* 25.5*  MCV 98.6 94.8 97.0  PLT 175 187 440   Basic Metabolic Panel: Recent Labs  Lab 04/21/19 1201  NA 137  K 4.2  CL 101  CO2 23  GLUCOSE 94  BUN 33*  CREATININE 1.35*  CALCIUM 8.7*   GFR: Estimated Creatinine Clearance: 59.2 mL/min (A) (by C-G formula based on SCr of 1.35 mg/dL (H)). Liver Function Tests: Recent Labs  Lab 04/21/19 1201  AST 24  ALT 19  ALKPHOS 78  BILITOT 1.7*  PROT 7.2  ALBUMIN 2.8*   No results for input(s): LIPASE, AMYLASE in the last 168 hours. No results for input(s): AMMONIA in the last 168 hours. Coagulation Profile: No results for input(s): INR,  PROTIME in the last 168 hours. Cardiac Enzymes: No results for input(s): CKTOTAL, CKMB, CKMBINDEX, TROPONINI in the last 168 hours. BNP (last 3 results) Recent Labs    11/26/18 1025  PROBNP 56.0   HbA1C: No results for input(s): HGBA1C in the last 72 hours. CBG: Recent Labs  Lab 04/21/19 1201 04/21/19 1309  GLUCAP 68* 70   Lipid Profile: No results for input(s): CHOL, HDL, LDLCALC, TRIG, CHOLHDL, LDLDIRECT in the last 72 hours. Thyroid Function Tests: No results for input(s): TSH, T4TOTAL, FREET4, T3FREE, THYROIDAB in the last 72 hours. Anemia Panel: No results for input(s): VITAMINB12, FOLATE, FERRITIN, TIBC, IRON, RETICCTPCT in the last 72 hours. Sepsis Labs: No results for input(s): PROCALCITON, LATICACIDVEN in the last 168 hours.  Recent Results (from the past 240 hour(s))  Urine culture     Status: Abnormal (Preliminary result)   Collection Time: 04/21/19  3:00 PM  Result Value Ref Range Status   Specimen Description URINE, CLEAN CATCH  Final   Special Requests NONE  Final   Culture (A)  Final    >=100,000 COLONIES/mL SERRATIA MARCESCENS REPEATING SUSCEPTIBILITIES Performed at [facility], [address] [place], [place] [address]    Report Status PENDING  Incomplete         Radiology Studies: No results found.      Scheduled Meds:  amiodarone  200 mg Oral Daily  carvedilol  12.5 mg Oral BID WC   divalproex  500 mg Oral Daily   DULoxetine  30 mg Oral Daily   furosemide  40 mg Oral Daily   hydrALAZINE  25 mg Oral TID   isosorbide mononitrate  30 mg Oral Daily   levETIRAcetam  1,000 mg Oral BID   losartan  25 mg Oral Daily   phenytoin  100 mg Oral BID   senna-docusate  2 tablet Oral BID   simvastatin  20 mg Oral q1800   tamsulosin  0.4 mg Oral Daily   Continuous Infusions:  sodium chloride 50 mL/hr at 04/23/19 1053     LOS: 1 day    Time spent: [place]    Nicolette Bang, MD [facility]  If 7PM-7AM,  please contact night-coverage  04/23/2019, 2:31 PM

## 2019-04-24 LAB — HEMOGLOBIN AND HEMATOCRIT, BLOOD
HCT: 26.5 % — ABNORMAL LOW (ref 39.0–52.0)
Hemoglobin: 8.5 g/dL — ABNORMAL LOW (ref 13.0–17.0)

## 2019-04-24 LAB — URINE CULTURE: Culture: >=100000 — AB

## 2019-04-24 MED ORDER — SODIUM CHLORIDE 0.9 % IV SOLN
1.0000 g | INTRAVENOUS | Status: DC
  Administered 2019-04-24: 10:00:00 1 g via INTRAVENOUS
  Filled 2019-04-24 (×2): qty 10

## 2019-04-24 MED ORDER — CEFDINIR 300 MG PO CAPS: 300.0000 mg | ORAL_CAPSULE | Freq: Two times a day (BID) | ORAL | 0 refills | Status: AC

## 2019-04-24 NOTE — Discharge Summary (Signed)
Physician Discharge Summary  [facility]. JAS:[id number] DOB: 1946/08/24 DOA: 04/21/2019  PCP: [name], MD  Admit date: 04/21/2019 Discharge date: 04/24/2019  Admitted From: Inpatient Disposition: home  Recommendations for Outpatient Follow-up:  1. Follow up with PCP in 1 to discuss restarting AC 2. Please obtain BMP/CBC in one week 3. Please follow up AS ABOVE 4. Hold anticoagulation for 1 week secondary to significant GI bleed.  Home Health:No Equipment/Devices:NONE  Discharge Condition:Stable CODE STATUS:Full code Diet recommendation: Regular healthy diet  Brief/Interim Summary: Per admitting MD: Patient is 73 year old male with history of chronic atrial fibrillation on edoxaban,25 minutes chronic systolic congestive heart failure status post AICD, presented to the emergency room with few days of confusion and left hip pain. Patient had been getting prednisone and pain medications as outpatient. Denied any history of trauma. Patient continued to have pain. Came to the emergency room and he was found to have large retroperitoneal hematoma of the left psoas muscle measuring 10 x 18 cm. Hemoglobin baseline was 16. Today her his hemoglobin is 8.2. Last dose of edoxaban was 04/21/2019.  Hospital course: Anticoagulation  was held secondary to significant drop in hemoglobin.  Discussed with the patient and his wife at length holding anticoagulation for at least 1 week and discussing with his primary care physician within that week prior to restarting.   As previously noted, surgery was consulted who suggested conservative management. Hemoglobin dropped by one-point and has remained stable since then, currently only increased to 8.5. Patient is otherwise hemodynamically stable.    And will follow-up closely with PCP as discussed  Acute blood loss anemia.  Patient's hemoglobin has remained stable.  Follow-up with PCP for continued monitoring and for decision when to  restart anticoagulation given that we are holding it 1 week  Factor VII deficiency with prior DVTs.  Is a difficult decision regarding anticoagulation I discussed it at length with patient and wife.  Because of the concern of life-threatening bleeding with a significant drop in his hemoglobin going to hold his anticoagulation for 1 week.  Recommended he discuss this with his PCP prior to resuming in that week.  Patient also instructed to return to local emergency room for any acute change in medical condition to include not limited to nausea vomiting chest pain shortness of breath dizziness or leg pain.  Chronic A. fib continue patient's home medications with the exception of anticoagulation as noted  Chronic systolic congestive heart failure status post AICD.  Patient will continue his home medications, he was not in exacerbation and was compensated during his hospital stay.  History of CAD patient had no anginal symptomatology during hospital stay no changes in medication regimen.  Hypertension continue home medications again no change  Seizure disorder patient is on phenytoin and Keppra.  Is had no recurrent seizures we will continue these medications.  UTI.  Patient had a UTI with Serratia.  He did report some symptoms to include mild burning.  He will be treated with a short course of antibiotics prescription provided.  Patient is hemodynamically stable and is afebrile with a normal white blood cell count  Discharge Diagnoses:  Active Problems:   Retroperitoneal bleed    Discharge Instructions  Discharge Instructions    Call MD for:   Complete by:  As directed    Any acute change in medical condition.  Also instructed to hold anticoagulation 1 week secondary to GI bleed and to discuss with PCP prior to restarting   Diet general  Complete by:  As directed    Increase activity slowly   Complete by:  As directed      Allergies as of 04/24/2019   No Known Allergies      Medication List    TAKE these medications   acetaminophen 325 MG tablet Commonly known as:  TYLENOL Take 650 mg by mouth every 6 (six) hours as needed for mild pain.   amiodarone 200 MG tablet Commonly known as:  PACERONE Take 1 tablet (200 mg total) by mouth daily.   carvedilol 12.5 MG tablet Commonly known as:  COREG TAKE 1 TABLET BY MOUTH TWICE DAILY WITH A MEAL What changed:  See the new instructions.   cefdinir 300 MG capsule Commonly known as:  OMNICEF Take 1 capsule (300 mg total) by mouth 2 (two) times daily for 5 days.   cyclobenzaprine 5 MG tablet Commonly known as:  FLEXERIL Take 1 tablet (5 mg total) by mouth 3 (three) times daily as needed for muscle spasms.   divalproex 500 MG 24 hr tablet Commonly known as:  DEPAKOTE ER Take 1 tablet every night What changed:    how much to take  how to take this  when to take this  additional instructions   DULoxetine 30 MG capsule Commonly known as:  CYMBALTA Take 1 capsule (30 mg total) by mouth daily.   edoxaban 60 MG Tabs tablet Commonly known as:  Savaysa Take 60 mg by mouth daily.   furosemide 40 MG tablet Commonly known as:  LASIX Take 1 tablet (40 mg total) by mouth daily.   hydrALAZINE 25 MG tablet Commonly known as:  APRESOLINE Take 1 tablet (25 mg total) by mouth 3 (three) times daily.   isosorbide mononitrate 30 MG 24 hr tablet Commonly known as:  IMDUR Take 1 tablet (30 mg total) by mouth daily.   levETIRAcetam 1000 MG tablet Commonly known as:  KEPPRA Take 1 tablet (1,000 mg total) by mouth 2 (two) times daily.   losartan 25 MG tablet Commonly known as:  COZAAR TAKE 1 TABLET BY MOUTH ONCE DAILY   phenytoin 100 MG ER capsule Commonly known as:  Dilantin Take 1 capsule (100 mg total) by mouth 2 (two) times daily.   polyethylene glycol 17 g packet Commonly known as:  MIRALAX / GLYCOLAX Take 17 g by mouth daily as needed.   senna-docusate 8.6-50 MG tablet Commonly known as:   Senokot-S Take 2 tablets by mouth 2 (two) times daily.   simvastatin 20 MG tablet Commonly known as:  ZOCOR TAKE 1 TABLET BY MOUTH ONCE DAILY AT 6PM What changed:  See the new instructions.   spironolactone 25 MG tablet Commonly known as:  ALDACTONE TAKE 1 TABLET BY MOUTH ONCE DAILY   tamsulosin 0.4 MG Caps capsule Commonly known as:  FLOMAX TAKE 1 CAPSULE BY MOUTH ONCE DAILY   Vitamin D (Ergocalciferol) 1.25 MG (50000 UT) Caps capsule Commonly known as:  DRISDOL Take 1 capsule (50,000 Units total) by mouth every 7 (seven) days.       No Known Allergies  Consultations:  GEN SURG   Procedures/Studies: Ct Angio Chest/abd/pel For Dissection W And/or Wo Contrast  Result Date: 04/21/2019 CLINICAL DATA:  Evaluate for dissection, left flank pain, dark urine EXAM: CT ANGIOGRAPHY CHEST, ABDOMEN AND PELVIS WITH AND WITHOUT CONTRAST TECHNIQUE: Multidetector CT imaging through the chest, abdomen and pelvis was performed. The chest was imaged prior to the administration of intravenous contrast. The chest, abdomen, and pelvis were imaged in  the arterial phase following the administration of intravenous contrast. Multiplanar reconstructed images and MIPs were obtained and reviewed to evaluate the vascular anatomy. CONTRAST:  177mL OMNIPAQUE IOHEXOL 350 MG/ML SOLN COMPARISON:  None. FINDINGS: CTA CHEST FINDINGS Cardiovascular: Satisfactory opacification of the pulmonary arteries to the segmental level. No evidence of pulmonary embolism. Coronary artery calcifications. Pericardial calcifications. Right chest multi lead pacer. Mediastinum/Nodes: No enlarged mediastinal, hilar, or axillary lymph nodes. Thyroid gland, trachea, and esophagus demonstrate no significant findings. Lungs/Pleura: Small left pleural effusion, possibly chronic and loculated, with associated atelectasis or consolidation. Upper Abdomen: No acute abnormality. Musculoskeletal: No chest wall abnormality. No acute or significant  osseous findings. Review of the MIP images confirms the above findings. CTA ABDOMEN AND PELVIS FINDINGS VASCULAR Aorta: Moderate to severe mixed calcific atherosclerosis of the aorta. Normal caliber aorta without aneurysm, dissection, vasculitis or significant stenosis. Celiac: Patent without evidence of aneurysm, dissection, vasculitis or significant stenosis. SMA: Patent without evidence of aneurysm, dissection, vasculitis or significant stenosis. Renals: Solitary renal arteries are patent without evidence of aneurysm, dissection, or significant stenosis. IMA: Patent without evidence of aneurysm, dissection, vasculitis or significant stenosis. Inflow: Severe mixed calcific atherosclerosis. Patent without evidence of aneurysm, dissection, vasculitis or significant stenosis. Veins: No obvious venous abnormality within the limitations of this arterial phase study. Review of the MIP images confirms the above findings. NON-VASCULAR Hepatobiliary: No focal liver abnormality is seen. No gallstones, gallbladder wall thickening, or biliary dilatation. Pancreas: Unremarkable. No pancreatic ductal dilatation or surrounding inflammatory changes. Spleen: Normal in size without focal abnormality. Adrenals/Urinary Tract: Adrenal glands are unremarkable. Numerous bilateral renal cysts. No hydronephrosis. Bladder is unremarkable. Stomach/Bowel: Stomach is within normal limits. Appendix appears normal. No evidence of bowel wall thickening, distention, or inflammatory changes. Vascular/Lymphatic: No significant vascular findings are present. No enlarged abdominal or pelvic lymph nodes. Reproductive: No mass or other abnormality. Other: No abdominal wall hernia or abnormality. No abdominopelvic ascites. There is a large retroperitoneal hematoma of the left psoas muscle body measuring at least 10.6 x 8.9 x 18.2 cm (series 6, image 202, series 10, image 130). Musculoskeletal: No acute or significant osseous findings. Review of the MIP  images confirms the above findings. IMPRESSION: 1. There is a large retroperitoneal hematoma of the left psoas muscle body measuring at least 10.6 x 8.9 x 18.2 cm (series 6, image 202, series 10, image 130). 2. Aortic atherosclerosis without evidence of aneurysm, dissection, or other acute aortic syndrome. 3.  Other chronic and incidental findings as detailed above. Electronically Signed   By: Eddie Candle M.D.   On: 04/21/2019 14:08       Subjective: Patient reports feeling good no bleeding noted no pain.  He felt he was ready to be discharged home.  Discharge Exam: Vitals:   04/24/19 0520 04/24/19 1019  BP: 108/75 120/80  Pulse: 80 81  Resp: 19   Temp: 98 F (36.7 C)   SpO2: 100%    Vitals:   04/23/19 1758 04/23/19 2122 04/24/19 0520 04/24/19 1019  BP: 99/68 110/77 108/75 120/80  Pulse: 86 84 80 81  Resp:  18 19   Temp:  98.3 F (36.8 C) 98 F (36.7 C)   TempSrc:  Oral Oral   SpO2:  100% 100%   Weight:      Height:        General: Pt is alert, awake, not in acute distress Cardiovascular: RRR, S1/S2 +, no rubs, no gallops Respiratory: CTA bilaterally, no wheezing, no rhonchi Abdominal: Soft, NT, ND,  bowel sounds + Extremities: no edema, no cyanosis, warm and well-perfused    The results of significant diagnostics from this hospitalization (including imaging, microbiology, ancillary and laboratory) are listed below for reference.     Microbiology: Recent Results (from the past 240 hour(s))  Urine culture     Status: Abnormal   Collection Time: 04/21/19  3:00 PM  Result Value Ref Range Status   Specimen Description URINE, CLEAN CATCH  Final   Special Requests   Final    NONE Performed at [facility], [place] [address] [place], [place] [address]    Culture >=100,000 COLONIES/mL SERRATIA MARCESCENS (A)  Final   Report Status 04/24/2019 FINAL  Final   Organism ID, Bacteria SERRATIA MARCESCENS (A)  Final      Susceptibility   Serratia marcescens - MIC*     CEFAZOLIN >=64 RESISTANT Resistant     CEFTRIAXONE <=1 SENSITIVE Sensitive     CIPROFLOXACIN <=0.25 SENSITIVE Sensitive     GENTAMICIN <=1 SENSITIVE Sensitive     NITROFURANTOIN 256 RESISTANT Resistant     TRIMETH/SULFA <=20 SENSITIVE Sensitive     * >=100,000 COLONIES/mL SERRATIA MARCESCENS     Labs: BNP (last 3 results) Recent Labs    12/06/18 1917  BNP 62.3   Basic Metabolic Panel: Recent Labs  Lab 04/21/19 1201  NA 137  K 4.2  CL 101  CO2 23  GLUCOSE 94  BUN 33*  CREATININE 1.35*  CALCIUM 8.7*   Liver Function Tests: Recent Labs  Lab 04/21/19 1201  AST 24  ALT 19  ALKPHOS 78  BILITOT 1.7*  PROT 7.2  ALBUMIN 2.8*   No results for input(s): LIPASE, AMYLASE in the last 168 hours. No results for input(s): AMMONIA in the last 168 hours. CBC: Recent Labs  Lab 04/21/19 1201 04/22/19 0238 04/23/19 0407 04/24/19 0659  WBC 9.7 8.8 9.1  --   NEUTROABS 7.1  --   --   --   HGB 9.0* 8.3* 8.1* 8.5*  HCT 29.1* 25.7* 25.5* 26.5*  MCV 98.6 94.8 97.0  --   PLT 175 187 195  --    Cardiac Enzymes: No results for input(s): CKTOTAL, CKMB, CKMBINDEX, TROPONINI in the last 168 hours. BNP: Invalid input(s): POCBNP CBG: Recent Labs  Lab 04/21/19 1201 04/21/19 1309  GLUCAP 68* 70   D-Dimer No results for input(s): DDIMER in the last 72 hours. Hgb A1c No results for input(s): HGBA1C in the last 72 hours. Lipid Profile No results for input(s): CHOL, HDL, LDLCALC, TRIG, CHOLHDL, LDLDIRECT in the last 72 hours. Thyroid function studies No results for input(s): TSH, T4TOTAL, T3FREE, THYROIDAB in the last 72 hours.  Invalid input(s): FREET3 Anemia work up No results for input(s): VITAMINB12, FOLATE, FERRITIN, TIBC, IRON, RETICCTPCT in the last 72 hours. Urinalysis    Component Value Date/Time   COLORURINE YELLOW 04/23/2019 1915   APPEARANCEUR CLOUDY (A) 04/23/2019 1915   LABSPEC 1.017 04/23/2019 1915   PHURINE 6.0 04/23/2019 1915   GLUCOSEU NEGATIVE 04/23/2019  1915   HGBUR SMALL (A) 04/23/2019 1915   BILIRUBINUR NEGATIVE 04/23/2019 1915   BILIRUBINUR Neg 11/27/2017 1032   KETONESUR NEGATIVE 04/23/2019 1915   PROTEINUR 30 (A) 04/23/2019 1915   UROBILINOGEN 0.2 11/27/2017 1032   UROBILINOGEN 1.0 10/13/2013 1855   NITRITE NEGATIVE 04/23/2019 1915   LEUKOCYTESUR LARGE (A) 04/23/2019 1915   Sepsis Labs Invalid input(s): PROCALCITONIN,  WBC,  LACTICIDVEN Microbiology Recent Results (from the past 240 hour(s))  Urine culture  Status: Abnormal   Collection Time: 04/21/19  3:00 PM  Result Value Ref Range Status   Specimen Description URINE, CLEAN CATCH  Final   Special Requests   Final    NONE Performed at [facility], [place] [address] [place], [place] [address]    Culture >=100,000 COLONIES/mL SERRATIA MARCESCENS (A)  Final   Report Status 04/24/2019 FINAL  Final   Organism ID, Bacteria SERRATIA MARCESCENS (A)  Final      Susceptibility   Serratia marcescens - MIC*    CEFAZOLIN >=64 RESISTANT Resistant     CEFTRIAXONE <=1 SENSITIVE Sensitive     CIPROFLOXACIN <=0.25 SENSITIVE Sensitive     GENTAMICIN <=1 SENSITIVE Sensitive     NITROFURANTOIN 256 RESISTANT Resistant     TRIMETH/SULFA <=20 SENSITIVE Sensitive     * >=100,000 COLONIES/mL SERRATIA MARCESCENS     Time coordinating discharge: 35 minutes  SIGNED:   Nicolette Bang, MD  [facility] 04/24/2019, 10:25 AM Pager   If 7PM-7AM, please contact night-coverage [email or web address] Password [email or web address]

## 2019-04-24 NOTE — Progress Notes (Signed)
Pt given discharge instructions and gone over with him. Pt verbalized understanding. Pt's wife contacted and informed of discharge and will be coming to pick pt up. Pt received his dose of IV antibiotics.

## 2019-04-29 ENCOUNTER — Ambulatory Visit (INDEPENDENT_AMBULATORY_CARE_PROVIDER_SITE_OTHER): Payer: Medicare Other | Admitting: Internal Medicine

## 2019-04-29 DIAGNOSIS — K661 Hemoperitoneum: Secondary | ICD-10-CM

## 2019-04-29 DIAGNOSIS — M1A9XX0 Chronic gout, unspecified, without tophus (tophi): Secondary | ICD-10-CM | POA: Diagnosis not present

## 2019-04-29 DIAGNOSIS — I4819 Other persistent atrial fibrillation: Secondary | ICD-10-CM | POA: Diagnosis not present

## 2019-04-29 DIAGNOSIS — D500 Iron deficiency anemia secondary to blood loss (chronic): Secondary | ICD-10-CM | POA: Diagnosis not present

## 2019-04-29 DIAGNOSIS — D682 Hereditary deficiency of other clotting factors: Secondary | ICD-10-CM | POA: Diagnosis not present

## 2019-04-29 MED ORDER — COLCHICINE 0.6 MG PO TABS: 0.3000 mg | ORAL_TABLET | Freq: Every day | ORAL | 0 refills | Status: DC

## 2019-04-29 NOTE — Progress Notes (Signed)
Virtual Visit via Video Note  I connected with [facility]. on 04/29/19 at  9:40 AM EDT by a video enabled telemedicine application and verified that I am speaking with the correct person using two identifiers.  The patient and the provider were at separate locations throughout the entire encounter.   I discussed the limitations of evaluation and management by telemedicine and the availability of in person appointments. The patient expressed understanding and agreed to proceed.  History of Present Illness: The patient is a 73 y.o. man with visit for hospital follow up (in for left flank pain and large retroperitoneal hemorrhage, anticoagulation held at that time, no surgical intervention was needed, Hg dropped from 16 to 8, stable around 8.5 at time of discharge, treated for UTI as well). Started taking the antibiotic and has two pills left. Denies side effects. Still having dark urine. Was doing well with discharge and then 2 days ago he started having more problems. Very poor appetite and barely drinking much liquids. Still urinating okay. Denies diarrhea or constipation. Is having some pain and swelling in his feet after eating beef prior to onset. He does have gout in the past and this feels similar. Had prior bad reaction with prednisone recently. Denies fevers or chills. Pain in left flank is still present but manageable. The pain in his feet is causing him to not be able to get out of bed since it started. Pain is 8/10-10/10. Has been off anticoagulant. Some pain in the right wrist in a region where multiple blood draws and some swelling. Feels kind of like a hard spot similar to the one of the flank but much smaller. Denies blood in stool or dark stools. Still dark urine. Overall it is worsening.   PMH, [place], social history reviewed and updated  Observations/Objective: Appearance: appears in pain, breathing appears normal, casual grooming, abdomen does not appear distended, throat normal,  mental status is A and oriented   Assessment and Plan: See problem oriented charting  Follow Up Instructions: colchicine for gout, recheck CBC and CMP for blood loss early next week, decide on anticoagulation at that time  I discussed the assessment and treatment plan with the patient. The patient was provided an opportunity to ask questions and all were answered. The patient agreed with the plan and demonstrated an understanding of the instructions.   The patient was advised to call back or seek an in-person evaluation if the symptoms worsen or if the condition fails to improve as anticipated.  [name], MD

## 2019-04-30 ENCOUNTER — Encounter: Payer: Self-pay | Admitting: Internal Medicine

## 2019-04-30 NOTE — Assessment & Plan Note (Signed)
This is the medical condition that he is on edoxaban for. At this time he has had 2 significant bleeds (current hematoma as well as GI bleed after polypectomy) and I feel he needs to talk with cardiology about the potential benefit versus risk as it appears that his risk is outweighing the benefit at this time. I have asked them to stay off edoxaban until labs next week and until they can talk to cardiology.

## 2019-04-30 NOTE — Assessment & Plan Note (Signed)
It is unclear if he ever met with hematology and upon review of current literature excessive bleeding is the most common problem with this factor deficiency depending on levels and given that this was found in 2013 those labs are not available. Upon review of his chart I did not find evidence of prior blood clots with this disorder for this patient and would not recommend edoxaban for this condition exclusively. He has now had 2 significant major bleeds (current and after polypectomy in recent years).

## 2019-04-30 NOTE — Assessment & Plan Note (Signed)
Significant drop with hematoma at that time. Prior baseline 16 Hg and current on discharge 8.5. He denies spread of hematoma and pain is controlled from that. He is off edoxadan at this time and rechecking CBC and CMP early next week and decide on anticoagulation at that time.

## 2019-04-30 NOTE — Assessment & Plan Note (Signed)
Will treat for potential flare in his feet with colchicine. Will not give NSAIDs at this time due to current large bleed recently. Can use tylenol for pain.

## 2019-04-30 NOTE — Assessment & Plan Note (Signed)
Pain is currently well controlled from this. Will stay off edoxaban at this time. Checking CBC/CMP early next week and adjust as needed.

## 2019-05-04 ENCOUNTER — Other Ambulatory Visit (INDEPENDENT_AMBULATORY_CARE_PROVIDER_SITE_OTHER): Payer: Medicare Other

## 2019-05-04 ENCOUNTER — Other Ambulatory Visit: Payer: Self-pay | Admitting: Internal Medicine

## 2019-05-04 DIAGNOSIS — D500 Iron deficiency anemia secondary to blood loss (chronic): Secondary | ICD-10-CM

## 2019-05-04 LAB — CBC
HCT: 34.1 % — ABNORMAL LOW (ref 39.0–52.0)
Hemoglobin: 11.2 g/dL — ABNORMAL LOW (ref 13.0–17.0)
MCHC: 32.9 g/dL (ref 30.0–36.0)
MCV: 97.5 fl (ref 78.0–100.0)
Platelets: 272 10*3/uL (ref 150.0–400.0)
RBC: 3.5 Mil/uL — ABNORMAL LOW (ref 4.22–5.81)
RDW: 16.4 % — ABNORMAL HIGH (ref 11.5–15.5)
WBC: 6.7 10*3/uL (ref 4.0–10.5)

## 2019-05-04 LAB — COMPREHENSIVE METABOLIC PANEL
ALT: 9 U/L (ref 0–53)
AST: 16 U/L (ref 0–37)
Albumin: 3.2 g/dL — ABNORMAL LOW (ref 3.5–5.2)
Alkaline Phosphatase: 95 U/L (ref 39–117)
BUN: 18 mg/dL (ref 6–23)
CO2: 27 mEq/L (ref 19–32)
Calcium: 9 mg/dL (ref 8.4–10.5)
Chloride: 102 mEq/L (ref 96–112)
Creatinine, Ser: 1.23 mg/dL (ref 0.40–1.50)
GFR: 70.3 mL/min (ref >60.00)
Glucose, Bld: 84 mg/dL (ref 70–99)
Potassium: 4.5 mEq/L (ref 3.5–5.1)
Sodium: 137 mEq/L (ref 135–145)
Total Bilirubin: 0.9 mg/dL (ref 0.2–1.2)
Total Protein: 7.9 g/dL (ref 6.0–8.3)

## 2019-05-13 ENCOUNTER — Other Ambulatory Visit (HOSPITAL_COMMUNITY): Payer: Self-pay

## 2019-06-01 ENCOUNTER — Telehealth (HOSPITAL_COMMUNITY): Payer: Self-pay | Admitting: *Deleted

## 2019-06-01 NOTE — Telephone Encounter (Signed)
Pt's wife called to inquire about his Nita Sells pt was in the hospital in April with retroperitoneal hematoma and Syvasa was stopped.  He has remained off since that time and hgb was up to 11 in May.  She wanted to know if pt should remain off or if we will need to restart.  He also had a history of bleed on Coumadin.  Will send to Dr Haroldine Laws for his recommendations.

## 2019-06-02 NOTE — Telephone Encounter (Signed)
I have not seen him since that time. If recent RP bleed would continue to hold anticoag

## 2019-06-03 NOTE — Telephone Encounter (Signed)
Spoke w/pt's wife and f/u appt sch for Fri 6/12 (telephone visit)

## 2019-06-05 ENCOUNTER — Other Ambulatory Visit: Payer: Self-pay

## 2019-06-05 ENCOUNTER — Ambulatory Visit (HOSPITAL_COMMUNITY)
Admission: RE | Admit: 2019-06-05 | Discharge: 2019-06-05 | Disposition: A | Discharge status: Home / Self Care | Payer: Medicare Other | Source: Ambulatory Visit | Attending: Internal Medicine | Admitting: Internal Medicine

## 2019-06-05 ENCOUNTER — Encounter (HOSPITAL_COMMUNITY): Payer: Self-pay

## 2019-06-05 DIAGNOSIS — Z01810 Encounter for preprocedural cardiovascular examination: Secondary | ICD-10-CM

## 2019-06-05 DIAGNOSIS — I2510 Atherosclerotic heart disease of native coronary artery without angina pectoris: Secondary | ICD-10-CM

## 2019-06-05 DIAGNOSIS — I5022 Chronic systolic (congestive) heart failure: Secondary | ICD-10-CM

## 2019-06-05 DIAGNOSIS — I10 Essential (primary) hypertension: Secondary | ICD-10-CM

## 2019-06-05 DIAGNOSIS — I480 Paroxysmal atrial fibrillation: Secondary | ICD-10-CM

## 2019-06-05 NOTE — Addendum Note (Signed)
Encounter addended by: Jovita Kussmaul, RN on: 06/05/2019 11:14 AM  Actions taken: Visit diagnoses modified, Order list changed, Diagnosis association updated

## 2019-06-05 NOTE — Progress Notes (Signed)
Heart Failure TeleHealth Note  Due to national recommendations of social distancing due to [place] 19, Audio/video telehealth visit is felt to be most appropriate for this patient at this time.  See MyChart message from today for patient consent regarding telehealth for [facility].  Date:  06/05/2019   ID:  Louis Ford., DOB 05-Aug-1946, MRN [id number]  Location: Home  Provider location: [facility] Type of Visit: Established patient  PCP:  [name], MD  Cardiologist:  No primary care provider on file. Primary HF: Ardyth Kelso  Chief Complaint: Heart Failure follow-up   History of Present Illness:  Louis Ford. is a 73 y.o. male with hx of CAD s/p CABG, ICM s/p Biotronik ICD, chronic systolic CHF Echo 55/97/41 LVEF 30-35% -> improved to 55-60% (3/17),HTN, HLD, atrial fibrillation, previous stroke, Factor 7 deficiency, and seizure disorder.  Admitted from [facility] in 10/16 with ADHF in setting of AF with RVR. He diuresed well initially on IV lasix 40 mg BID, but continued to have SOB and was thus on milrinoneon 10/14 for concerns of low output. RHC on 10/12/15 that showed depressed cardiac output with Fick CO/CI 3.6/1.6. Improved on milrinone and remained stable with wean. Bidil started and increased to goal dose.Also started on amio for afib rate control. D/c weight 229 lbs  Admitted 02/26/16 after two near syncopal episodes. ICD interrogation negative for any acute events. Suspect related to overdiuresis and hypotension with recurrent Afib.  Spontaneously converted to NSR without intervention. Echo EF 55-60%   Myoview done for CP 11/17 Nuclear stress EF: 39%. Inferior wall akinesis. Dilated left ventricle. Defect 1: There is a small defect of mild severity present in the apex location.  Admitted 2/18 with LGIB after polypectomy found to have bleeding from the stalk and was clipped.   Admitted 12/28 => 12/28/17. Pt had UTI that  exacerbated his PAF leading to Afib RVR. Underwent DCCV with transition to NSR. Amiodarone loaded.   Admitted 4/28-04/24/19 presented to the emergency room with few days of confusion and left hip pain after a fall. Came to the emergency room and he was found to have large retroperitoneal hematoma of the left psoas muscle measuring 10 x 18 cm. Edoxaban was stopped and has not been restarted.  Presents for telehealth visit today for pre-op clearance for teeth extraction. Saw Dr. Sharlet Salina recently and hgb back up to 11. Denies further pain.  Denies CP, SOB or edema. Denies palpitations. Able to do all ADLs.   RHC 10/12/15 RA = 7 RV = 28/1/8 PA = 32/15 (20) PCW = 12 Fick cardiac output/index = 3.6/1.6 Thermo cardiac output/index = 3.5/1.5 PVR = 2.2 WU PA sat = 44%, 47% Ao sat = 98%  Echo 10/05/15 LVEF 30-35%, Mild MR, PA peak pressure 34 mmHg Echo 02/27/16 LVEF 55-60%, Grade 2 DD dysfunction. LAE mildly dilated, PA peak pressure 35 mm Hg.  Myoview 11/17 EF 39%  Labs: 12/30/15 K 5.0, creatinine 1.3 Labs: 02/29/16 K 4.7, creatinine 1.27 Labs: 03/25/2016: K 4.3 Creatinine 1.31 Labs: Creatinine 1.89  who presents via audio/video conferencing for a telehealth visit today.     [facility]. denies symptoms worrisome for COVID 19.   Past Medical History:  Diagnosis Date  . AICD (automatic cardioverter/defibrillator) present   . Atrial fibrillation   09/22/2012  . Blood loss anemia 04/18/2017   After GI bleed from colonoscopy and polypectomy  . CAD (coronary artery disease)   . Chronic systolic  heart failure ([place])   . 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 GIB (gastrointestinal bleeding) 02/20/2017  . History of MRSA infection 05/2011  . HTN (hypertension)   . Hx of adenomatous colonic polyps 02/20/2017   01/2017 - 3 cm sigmoid TV adenoma and other smaller polyps - had post-polypectomy bleed Tx w/ clips Consider repeat colonoscopy 3 yrs [name], MD, [name]   . Hyperlipidemia   . implantable cardiac defibrillator-Biotronik    Device Implanted 2006; s/p gen change 03/2011 : bleeding persistent with pocket erosion and infection; explant and reimplant  06/2011  . Ischemic cardiomyopathy    EF 15 to 20% by TTE and TEE in 09/2012.  Severe LV dysfunction  . Obesity    BMI 31 in 09/2012  . Persistent atrial fibrillation with rapid ventricular response 04/21/2014  . Retroperitoneal bleed 04/21/2019  . Seizure disorder ([place]) latest 09/30/2012  . Seizures ([place])   . Stroke [facility])    Past Surgical History:  Procedure Laterality Date  . CARDIAC CATHETERIZATION N/A 10/12/2015   Procedure: Right Heart Cath;  Surgeon: [name], MD;  Location: [facility] CV LAB;  Service: Cardiovascular;  Laterality: N/A;  . CARDIOVERSION  09/24/2012   Procedure: CARDIOVERSION;  Surgeon: [name], MD;  Location: [facility] ENDOSCOPY;  Service: Cardiovascular;  Laterality: N/A;  . CARDIOVERSION N/A 12/25/2017   Procedure: CARDIOVERSION;  Surgeon: [name], MD;  Location: [facility] ORS;  Service: Cardiovascular;  Laterality: N/A;  . COLONOSCOPY W/ POLYPECTOMY  02/13/2017  . CORONARY ARTERY BYPASS GRAFT  2011   in [place]  . FLEXIBLE SIGMOIDOSCOPY N/A 02/20/2017   Procedure: FLEXIBLE SIGMOIDOSCOPY;  Surgeon: [name], MD;  Location: [facility] ENDOSCOPY;  Service: Endoscopy;  Laterality: N/A;  . FLEXIBLE SIGMOIDOSCOPY N/A 02/21/2017   Procedure: FLEXIBLE SIGMOIDOSCOPY;  Surgeon: [name], MD;  Location: [facility] ENDOSCOPY;  Service: Endoscopy;  Laterality: N/A;  . ICD    . TEE WITHOUT CARDIOVERSION  09/24/2012   Procedure: TRANSESOPHAGEAL ECHOCARDIOGRAM (TEE);  Surgeon: [name], MD;  Location: [facility] ENDOSCOPY;  Service: Cardiovascular;  Laterality: N/A;  dave/anesth, dl, cindy/echo      Current Outpatient Medications  Medication Sig Dispense Refill  . acetaminophen (TYLENOL) 325 MG tablet Take 650 mg by mouth every 6 (six) hours as needed for mild pain.     [name] amiodarone (PACERONE) 200 MG tablet Take 1 tablet by mouth once daily 30 tablet 0  . carvedilol (COREG) 12.5 MG tablet TAKE 1 TABLET BY MOUTH TWICE DAILY WITH A MEAL (Patient taking differently: Take 12.5 mg by mouth 2 (two) times daily with a meal. ) 180 tablet 0  . colchicine 0.6 MG tablet Take 0.5 tablets (0.3 mg total) by mouth daily. 30 tablet 0  . cyclobenzaprine (FLEXERIL) 5 MG tablet Take 1 tablet (5 mg total) by mouth 3 (three) times daily as needed for muscle spasms. (Patient not taking: Reported on 04/21/2019) 30 tablet 1  . divalproex (DEPAKOTE ER) 500 MG 24 hr tablet Take 1 tablet every night (Patient taking differently: Take 500 mg by mouth daily. ) 30 tablet 11  . DULoxetine (CYMBALTA) 30 MG capsule Take 1 capsule (30 mg total) by mouth daily. 90 capsule 1  . furosemide (LASIX) 40 MG tablet Take 1 tablet (40 mg total) by mouth daily. 90 tablet 3  . hydrALAZINE (APRESOLINE) 25 MG tablet Take 1 tablet (25 mg total) by mouth 3 (three) times daily. 270 tablet 3  .  isosorbide mononitrate (IMDUR) 30 MG 24 hr tablet Take 1 tablet (30 mg total) by mouth daily. 90 tablet 3  . levETIRAcetam (KEPPRA) 1000 MG tablet Take 1 tablet (1,000 mg total) by mouth 2 (two) times daily. 60 tablet 11  . losartan (COZAAR) 25 MG tablet TAKE 1 TABLET BY MOUTH ONCE DAILY (Patient not taking: No sig reported) 90 tablet 3  . phenytoin (DILANTIN) 100 MG ER capsule Take 1 capsule (100 mg total) by mouth 2 (two) times daily. 60 capsule 11  . polyethylene glycol (MIRALAX / GLYCOLAX) packet Take 17 g by mouth daily as needed. (Patient not taking: Reported on 04/21/2019) 14 each 0  . senna-docusate (SENOKOT-S) 8.6-50 MG tablet Take 2 tablets by mouth 2 (two) times daily. (Patient not taking: Reported on 04/21/2019) 10 tablet 0  . simvastatin (ZOCOR) 20 MG tablet TAKE 1 TABLET BY MOUTH ONCE DAILY AT 6PM (Patient taking differently: Take 20 mg by mouth daily at 6 PM. ) 90 tablet 0  . spironolactone (ALDACTONE) 25 MG tablet  TAKE 1 TABLET BY MOUTH ONCE DAILY (Patient not taking: No sig reported) 90 tablet 1  . tamsulosin (FLOMAX) 0.4 MG CAPS capsule TAKE 1 CAPSULE BY MOUTH ONCE DAILY (Patient taking differently: Take 0.4 mg by mouth daily. ) 90 capsule 1  . Vitamin D, Ergocalciferol, (DRISDOL) 50000 units CAPS capsule Take 1 capsule (50,000 Units total) by mouth every 7 (seven) days. (Patient not taking: Reported on 04/21/2019) 12 capsule 0   No current facility-administered medications for this encounter.     Allergies:   Patient has no known allergies.   Social History:  The patient  reports that he quit smoking about 10 years ago. His smoking use included pipe. He quit smokeless tobacco use about 10 years ago. He reports that he does not drink alcohol or use drugs.   Family History:  The patient's family history includes Arthritis in his mother; Heart attack in his mother; Heart disease in his mother; Other in his father.   ROS:  Please see the history of present illness.   All other systems are personally reviewed and negative.   Exam:  (Video/Tele Health Call; Exam is subjective and or/visual.) General:  Speaks in full sentences. No resp difficulty. Lungs: Normal respiratory effort with conversation.  Abdomen: Non-distended per patient report Extremities: Pt denies edema. Neuro: Alert & oriented x 3.   Recent Labs: 11/26/2018: Magnesium 1.8; Pro B Natriuretic peptide (BNP) 56.0; TSH 9.80 12/06/2018: B Natriuretic Peptide 36.4 05/04/2019: ALT 9; BUN 18; Creatinine, Ser 1.23; Hemoglobin 11.2; Platelets 272.0; Potassium 4.5; Sodium 137  Personally reviewed   Wt Readings from Last 3 Encounters:  04/21/19 92.5 kg (203 lb 14.8 oz)  01/29/19 98.9 kg (218 lb)  01/06/19 97.1 kg (214 lb)      ASSESSMENT AND PLAN:  1. Chronic Systolic CHF, Echo 05/25/12 Echo improved to 55-60% from echo 10/05/15 LVEF 30-35% - s/p Biotronik ICD - Myoview 11/17 EF 39% - Echo 03/2017 LVEF 50-55%.  - Doing well NYHA II - Volume  status stable. Continue Continue lasix 40 mg daily.  - Continue carvedilol 3.125 mg BID.  - Continue spironolactone 25 mg daily.    - Continue losartan 25 mg daily. N\\ - Continue hydralazine 25 mg three times a day and 30 mg imdur daily.   - Will need to re-establish with EP to follow ICD - repeat echo at next visit in 6 months.   2. PAF  - Appears to be maintaining NSR.  -  Continue amiodarone 200 mg daily.  - edoxaban stopped due to large RP bleed in 4/20. Would not restart at this time   3. CAD s/p CABG - Myoview 10/2016 with no significant ischemia - No s/s ischemia - Continue medical therapy.   4. Pre-op CV exam - low risk for CV complications with teeth extraction. Ok to proceed  5. HTN - Blood pressure well controlled. Continue current regimen.  6. CKD stage III - creatinine stable on recent labs  7. Learning disability  - Previously paramedicine patient but graduated.   COVID screen The patient does not have any symptoms that suggest any further testing/ screening at this time.  Social distancing reinforced today.  Recommended follow-up:  As above  Relevant cardiac medications were reviewed at length with the patient today.   The patient does not have concerns regarding their medications at this time.   The following changes were made today:  As above  Today, I have spent 24 minutes with the patient with telehealth technology discussing the above issues .    Signed, Glori Bickers, MD  06/05/2019 10:53 AM  Advanced [facility] Failure [address] [place] [address] [phone] (office) [phone] (fax)

## 2019-06-08 ENCOUNTER — Telehealth: Payer: Self-pay | Admitting: Student

## 2019-06-08 NOTE — Telephone Encounter (Signed)
LMOM to let patient know he is at [facility] and will need gen change. Will need appointment to discuss. Thank you!

## 2019-06-09 ENCOUNTER — Telehealth: Payer: Self-pay

## 2019-06-09 NOTE — Telephone Encounter (Signed)
Patient is scheduled for virtual visit on 06/11/19 with Dr. Lovena Le.

## 2019-06-09 NOTE — Telephone Encounter (Signed)
YOUR CARDIOLOGY TEAM HAS ARRANGED FOR AN E-VISIT FOR YOUR APPOINTMENT - PLEASE REVIEW IMPORTANT INFORMATION BELOW SEVERAL DAYS PRIOR TO YOUR APPOINTMENT  Due to the recent COVID-19 pandemic, we are transitioning in-person office visits to tele-medicine visits in an effort to decrease unnecessary exposure to our patients, their families, and staff. These visits are billed to your insurance just like a normal visit is. We also encourage you to sign up for MyChart if you have not already done so. You will need a smartphone if possible. For patients that do not have this, we can still complete the visit using a regular telephone but do prefer a smartphone to enable video when possible. You may have a family member that lives with you that can help. If possible, we also ask that you have a blood pressure cuff and scale at home to measure your blood pressure, heart rate and weight prior to your scheduled appointment. Patients with clinical needs that need an in-person evaluation and testing will still be able to come to the office if absolutely necessary. If you have any questions, feel free to call our office.     YOUR PROVIDER WILL BE USING THE FOLLOWING PLATFORM TO COMPLETE YOUR VISIT: Doximity  . IF USING MYCHART - How to Download the MyChart App to Your SmartPhone   - If Apple, go to App Store and type in MyChart in the search bar and download the app. If Android, ask patient to go to Google Play Store and type in MyChart in the search bar and download the app. The app is free but as with any other app downloads, your phone may require you to verify saved payment information or Apple/Android password.  - You will need to then log into the app with your MyChart username and password, and select [facility] as your healthcare provider to link the account.  - When it is time for your visit, go to the MyChart app, find appointments, and click Begin Video Visit. Be sure to Select Allow for your device to  access the Microphone and Camera for your visit. You will then be connected, and your provider will be with you shortly.  **If you have any issues connecting or need assistance, please contact MyChart service desk [phone] ([phone])**  **If using a computer, in order to ensure the best quality for your visit, you will need to use either of the following Internet Browsers: Google Chrome or Microsoft Edge**  . IF USING DOXIMITY or [email or web address] - The staff will give you instructions on receiving your link to join the meeting the day of your visit.      2-3 DAYS BEFORE YOUR APPOINTMENT  You will receive a telephone call from one of our HeartCare team members - your caller ID may say "Unknown caller." If this is a video visit, we will walk you through how to get the video launched on your phone. We will remind you check your blood pressure, heart rate and weight prior to your scheduled appointment. If you have an Apple Watch or Kardia, please upload any pertinent ECG strips the day before or morning of your appointment to MyChart. Our staff will also make sure you have reviewed the consent and agree to move forward with your scheduled tele-health visit.     THE DAY OF YOUR APPOINTMENT  Approximately 15 minutes prior to your scheduled appointment, you will receive a telephone call from one of HeartCare team - your caller ID may say "Unknown caller."    Our staff will confirm medications, vital signs for the day and any symptoms you may be experiencing. Please have this information available prior to the time of visit start. It may also be helpful for you to have a pad of paper and pen handy for any instructions given during your visit. They will also walk you through joining the smartphone meeting if this is a video visit.    CONSENT FOR TELE-HEALTH VISIT - PLEASE REVIEW  I hereby voluntarily request, consent and authorize [facility] and its employed or contracted physicians, physician  assistants, nurse practitioners or other licensed health care professionals (the Practitioner), to provide me with telemedicine health care services (the "Services") as deemed necessary by the treating Practitioner. I acknowledge and consent to receive the Services by the Practitioner via telemedicine. I understand that the telemedicine visit will involve communicating with the Practitioner through live audiovisual communication technology and the disclosure of certain medical information by electronic transmission. I acknowledge that I have been given the opportunity to request an in-person assessment or other available alternative prior to the telemedicine visit and am voluntarily participating in the telemedicine visit.  I understand that I have the right to withhold or withdraw my consent to the use of telemedicine in the course of my care at any time, without affecting my right to future care or treatment, and that the Practitioner or I may terminate the telemedicine visit at any time. I understand that I have the right to inspect all information obtained and/or recorded in the course of the telemedicine visit and may receive copies of available information for a reasonable fee.  I understand that some of the potential risks of receiving the Services via telemedicine include:  . Delay or interruption in medical evaluation due to technological equipment failure or disruption; . Information transmitted may not be sufficient (e.g. poor resolution of images) to allow for appropriate medical decision making by the Practitioner; and/or  . In rare instances, security protocols could fail, causing a breach of personal health information.  Furthermore, I acknowledge that it is my responsibility to provide information about my medical history, conditions and care that is complete and accurate to the best of my ability. I acknowledge that Practitioner's advice, recommendations, and/or decision may be based on  factors not within their control, such as incomplete or inaccurate data provided by me or distortions of diagnostic images or specimens that may result from electronic transmissions. I understand that the practice of medicine is not an exact science and that Practitioner makes no warranties or guarantees regarding treatment outcomes. I acknowledge that I will receive a copy of this consent concurrently upon execution via email to the email address I last provided but may also request a printed copy by calling the office of [facility].    I understand that my insurance will be billed for this visit.   I have read or had this consent read to me. . I understand the contents of this consent, which adequately explains the benefits and risks of the Services being provided via telemedicine.  . I have been provided ample opportunity to ask questions regarding this consent and the Services and have had my questions answered to my satisfaction. . I give my informed consent for the services to be provided through the use of telemedicine in my medical care  By participating in this telemedicine visit I agree to the above.  

## 2019-06-11 ENCOUNTER — Other Ambulatory Visit: Payer: Self-pay

## 2019-06-11 ENCOUNTER — Telehealth (INDEPENDENT_AMBULATORY_CARE_PROVIDER_SITE_OTHER): Payer: Medicare Other | Admitting: Internal Medicine

## 2019-06-11 DIAGNOSIS — I5022 Chronic systolic (congestive) heart failure: Secondary | ICD-10-CM

## 2019-06-11 DIAGNOSIS — Z95810 Presence of automatic (implantable) cardiac defibrillator: Secondary | ICD-10-CM

## 2019-06-11 NOTE — Progress Notes (Signed)
Electrophysiology TeleHealth Note   Due to national recommendations of social distancing due to COVID 19, an audio/video telehealth visit is felt to be most appropriate for this patient at this time.  See MyChart message from today for the patient's consent to telehealth for [name].   Date:  06/11/2019   ID:  Louis Dandy., DOB Mar 13, 1946, MRN [id number]  Location: patient's home  Provider location: [address], [place]  Evaluation Performed: Follow-up visit  PCP:  Louis Koch, MD  Cardiologist:  No primary care provider on file. Dr. Reine Ford Electrophysiologist:  Dr Louis Ford  Chief Complaint:  "I''m doing ok."  History of Present Illness:    [name] is a 73 y.o. male who presents via audio/video conferencing for a telehealth visit today.  He has a longstanding DCM and chronci systolic heart failure, s/p ICD insertion and has Ford reached ERI. He has had some dental problems and is pending a visit to discuss removal of 17 teeth.  Today, he denies symptoms of palpitations, chest pain, shortness of breath,  lower extremity edema, dizziness, presyncope, or syncope.  The patient is otherwise without complaint today.  The patient denies symptoms of fevers, chills, cough, or new SOB worrisome for COVID 19.  Past Medical History:  Diagnosis Date  . AICD (automatic cardioverter/defibrillator) present   . Atrial fibrillation   09/22/2012  . Blood loss anemia 04/18/2017   After GI bleed from colonoscopy and polypectomy  . CAD (coronary artery disease)   . Chronic systolic heart failure ([place])   . 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 GIB (gastrointestinal bleeding) 02/20/2017  . History of MRSA infection 05/2011  . HTN (hypertension)   . Hx of adenomatous colonic polyps 02/20/2017   01/2017 - 3 cm sigmoid TV adenoma and other smaller polyps - had post-polypectomy bleed Tx w/ clips Consider repeat colonoscopy 3  yrs Louis Mayer, MD, Louis Ford   . Hyperlipidemia   . implantable cardiac defibrillator-Biotronik    Device Implanted 2006; s/p gen change 03/2011 : bleeding persistent with pocket erosion and infection; explant and reimplant  06/2011  . Ischemic cardiomyopathy    EF 15 to 20% by TTE and TEE in 09/2012.  Severe LV dysfunction  . Obesity    BMI 31 in 09/2012  . Persistent atrial fibrillation with rapid ventricular response 04/21/2014  . Retroperitoneal bleed 04/21/2019  . Seizure disorder ([place]) latest 09/30/2012  . Seizures ([place])   . Stroke [facility])     Past Surgical History:  Procedure Laterality Date  . CARDIAC CATHETERIZATION N/A 10/12/2015   Procedure: Right Heart Cath;  Surgeon: [name], MD;  Location: [place] CV LAB;  Service: Cardiovascular;  Laterality: N/A;  . CARDIOVERSION  09/24/2012   Procedure: CARDIOVERSION;  Surgeon: [name], MD;  Location: [facility] ENDOSCOPY;  Service: Cardiovascular;  Laterality: N/A;  . CARDIOVERSION N/A 12/25/2017   Procedure: CARDIOVERSION;  Surgeon: [name], MD;  Location: [facility] ORS;  Service: Cardiovascular;  Laterality: N/A;  . COLONOSCOPY W/ POLYPECTOMY  02/13/2017  . CORONARY ARTERY BYPASS GRAFT  2011   in [place]  . FLEXIBLE SIGMOIDOSCOPY N/A 02/20/2017   Procedure: FLEXIBLE SIGMOIDOSCOPY;  Surgeon: [name], MD;  Location: [facility] ENDOSCOPY;  Service: Endoscopy;  Laterality: N/A;  . FLEXIBLE SIGMOIDOSCOPY N/A 02/21/2017   Procedure: FLEXIBLE SIGMOIDOSCOPY;  Surgeon: [name], MD;  Location: [facility] ENDOSCOPY;  Service: Endoscopy;  Laterality: N/A;  .  ICD    . TEE WITHOUT CARDIOVERSION  09/24/2012   Procedure: TRANSESOPHAGEAL ECHOCARDIOGRAM (TEE);  Surgeon: [name], MD;  Location: [facility] ENDOSCOPY;  Service: Cardiovascular;  Laterality: N/A;  Louis Ford/anesth, dl, Louis Ford/echo     Current Outpatient Medications  Medication Sig Dispense Refill  . acetaminophen (TYLENOL) 325 MG tablet Take 650 mg by mouth every 6 (six) hours as needed for  mild pain.    [name] amiodarone (PACERONE) 200 MG tablet Take 1 tablet by mouth once daily 30 tablet 0  . carvedilol (COREG) 12.5 MG tablet TAKE 1 TABLET BY MOUTH TWICE DAILY WITH A MEAL (Patient taking differently: Take 12.5 mg by mouth 2 (two) times daily with a meal. ) 180 tablet 0  . colchicine 0.6 MG tablet Take 0.5 tablets (0.3 mg total) by mouth daily. 30 tablet 0  . cyclobenzaprine (FLEXERIL) 5 MG tablet Take 1 tablet (5 mg total) by mouth 3 (three) times daily as needed for muscle spasms. (Patient not taking: Reported on 04/21/2019) 30 tablet 1  . divalproex (DEPAKOTE ER) 500 MG 24 hr tablet Take 1 tablet every night (Patient taking differently: Take 500 mg by mouth daily. ) 30 tablet 11  . DULoxetine (CYMBALTA) 30 MG capsule Take 1 capsule (30 mg total) by mouth daily. 90 capsule 1  . furosemide (LASIX) 40 MG tablet Take 1 tablet (40 mg total) by mouth daily. 90 tablet 3  . hydrALAZINE (APRESOLINE) 25 MG tablet Take 1 tablet (25 mg total) by mouth 3 (three) times daily. 270 tablet 3  . isosorbide mononitrate (IMDUR) 30 MG 24 hr tablet Take 1 tablet (30 mg total) by mouth daily. 90 tablet 3  . levETIRAcetam (KEPPRA) 1000 MG tablet Take 1 tablet (1,000 mg total) by mouth 2 (two) times daily. 60 tablet 11  . losartan (COZAAR) 25 MG tablet TAKE 1 TABLET BY MOUTH ONCE DAILY (Patient not taking: No sig reported) 90 tablet 3  . phenytoin (DILANTIN) 100 MG ER capsule Take 1 capsule (100 mg total) by mouth 2 (two) times daily. 60 capsule 11  . polyethylene glycol (MIRALAX / GLYCOLAX) packet Take 17 g by mouth daily as needed. (Patient not taking: Reported on 04/21/2019) 14 each 0  . senna-docusate (SENOKOT-S) 8.6-50 MG tablet Take 2 tablets by mouth 2 (two) times daily. (Patient not taking: Reported on 04/21/2019) 10 tablet 0  . simvastatin (ZOCOR) 20 MG tablet TAKE 1 TABLET BY MOUTH ONCE DAILY AT 6PM (Patient taking differently: Take 20 mg by mouth daily at 6 PM. ) 90 tablet 0  . spironolactone (ALDACTONE)  25 MG tablet TAKE 1 TABLET BY MOUTH ONCE DAILY (Patient not taking: No sig reported) 90 tablet 1  . tamsulosin (FLOMAX) 0.4 MG CAPS capsule TAKE 1 CAPSULE BY MOUTH ONCE DAILY (Patient taking differently: Take 0.4 mg by mouth daily. ) 90 capsule 1  . Vitamin D, Ergocalciferol, (DRISDOL) 50000 units CAPS capsule Take 1 capsule (50,000 Units total) by mouth every 7 (seven) days. (Patient not taking: Reported on 04/21/2019) 12 capsule 0   No current facility-administered medications for this visit.     Allergies:   Patient has no known allergies.   Social History:  The patient  reports that he quit smoking about 10 years ago. His smoking use included pipe. He quit smokeless tobacco use about 10 years ago. He reports that he does not drink alcohol or use drugs.   Family History:  The patient's family history includes Arthritis in his mother; Heart attack in his  mother; Heart disease in his mother; Other in his father.   ROS:  Please see the history of present illness.   All other systems are personally reviewed and negative.    Exam:    Vital Signs:  There were no vitals taken for this visit.  Well appearing, alert and conversant, regular work of breathing,  good skin color Eyes- anicteric, neuro- grossly intact, skin- no apparent rash or lesions or cyanosis, mouth- oral mucosa is pink   Labs/Other Tests and Data Reviewed:    Recent Labs: 11/26/2018: Magnesium 1.8; Pro B Natriuretic peptide (BNP) 56.0; TSH 9.80 12/06/2018: B Natriuretic Peptide 36.4 05/04/2019: ALT 9; BUN 18; Creatinine, Ser 1.23; Hemoglobin 11.2; Platelets 272.0; Potassium 4.5; Sodium 137   Wt Readings from Last 3 Encounters:  04/21/19 203 lb 14.8 oz (92.5 kg)  01/29/19 218 lb (98.9 kg)  01/06/19 214 lb (97.1 kg)     Other studies personally reviewed:  Last device remote is reviewed from [facility] PDF dated 06/11/19 which reveals normal device function, no arrhythmias and ERI.   ASSESSMENT & PLAN:    1.  ICD - his  device has reached ERI. I discussed timing of his ICD. We will plan for him to undergo ICD gen change in early/mid August. 2. Dental carries - he is pending removal of 17 teeth. I would hope that this could be accomplished 2 or more weeks before his ICD gen change. 3. Chronic systolic heart failure - he will continue his current meds. He is class 2.  4. COVID 19 screen The patient denies symptoms of COVID 19 at this time.  The importance of social distancing was discussed today.  Follow-up:  5-6 months Next remote: 5 months  Current medicines are reviewed at length with the patient today.   The patient does not have concerns regarding his medicines.  The following changes were made today:  none  Labs/ tests ordered today include: none No orders of the defined types were placed in this encounter.    Patient Risk:  after full review of this patients clinical status, I feel that they are at moderate risk at this time.  Today, I have spent 15 minutes with the patient with telehealth technology discussing all of the above.    Signed, [name], MD  06/11/2019 2:59 PM     [place] [address] [place] [address] [phone] (office) [phone] (fax)

## 2019-06-19 ENCOUNTER — Telehealth: Payer: Self-pay | Admitting: Internal Medicine

## 2019-06-19 ENCOUNTER — Other Ambulatory Visit: Payer: Self-pay | Admitting: *Deleted

## 2019-06-19 MED ORDER — CARVEDILOL 12.5 MG PO TABS: ORAL_TABLET | ORAL | 1 refills | Status: DC

## 2019-06-19 MED ORDER — SIMVASTATIN 20 MG PO TABS: ORAL_TABLET | ORAL | 1 refills | Status: DC

## 2019-06-19 NOTE — Telephone Encounter (Signed)
Medications: carvedilol (COREG) 12.5 MG tablet And  colchicine 0.6 MG tablet    Pharmacist with [facility] states that these medications have possible interaction and is requesting call back from CMA to discuss. Please advise.

## 2019-06-19 NOTE — Telephone Encounter (Signed)
I called pharmacy to advise of below and was on hold too long. Will call back later.

## 2019-06-19 NOTE — Telephone Encounter (Signed)
Okay to dispense both

## 2019-06-24 NOTE — Telephone Encounter (Signed)
Pharmacy informed of below.  

## 2019-06-25 ENCOUNTER — Ambulatory Visit (INDEPENDENT_AMBULATORY_CARE_PROVIDER_SITE_OTHER): Payer: Medicare Other | Admitting: *Deleted

## 2019-06-25 DIAGNOSIS — I255 Ischemic cardiomyopathy: Secondary | ICD-10-CM | POA: Diagnosis not present

## 2019-06-25 LAB — CUP PACEART REMOTE DEVICE CHECK
Battery Voltage: 2.89 V
Brady Statistic RV Percent Paced: 0 %
Date Time Interrogation Session: 20200702090004
HighPow Impedance: 65 Ohm
Implantable Lead Implant Date: 20060927
Implantable Lead Location: 753860
Implantable Lead Model: 351
Implantable Lead Serial Number: 10260064
Implantable Pulse Generator Implant Date: 20120413
Lead Channel Impedance Value: 482 Ohm
Lead Channel Pacing Threshold Amplitude: 0.6 V
Lead Channel Pacing Threshold Pulse Width: 0.4 ms
Lead Channel Sensing Intrinsic Amplitude: 12.2 mV
Lead Channel Setting Pacing Amplitude: 2 V
Lead Channel Setting Pacing Pulse Width: 0.4 ms
Pulse Gen Serial Number: 60613417

## 2019-06-29 ENCOUNTER — Telehealth: Payer: Self-pay | Admitting: Internal Medicine

## 2019-06-29 ENCOUNTER — Telehealth: Payer: Self-pay

## 2019-06-29 NOTE — Telephone Encounter (Signed)
  Pt was symptomatic with VT. No further. Discussed with Dr. Lovena Le. Made device check for tomorrow afternoon.   Plan to reschedule VT zones to MONITOR 170-200, TREAT 200-230 with 2 bursts, 2 ramps, and then shocks.   No med changes at this time.   [name] [address]" [place], PA-C 06/29/2019 12:18 PM

## 2019-06-29 NOTE — Telephone Encounter (Signed)
LMOM to call DC. Need to assess pt symptoms during event on 06/26/19 where ATP tx received to abort shock for what appears to be VT.

## 2019-06-29 NOTE — Telephone Encounter (Signed)
Appointment at 12 pm.  LMOVM      COVID-19 Pre-Screening Questions:  . In the past 7 to 10 days have you had a cough,  shortness of breath, headache, congestion, fever (100 or greater) body aches, chills, sore throat, or sudden loss of taste or sense of smell? . Have you been around anyone with known Covid 19. . Have you been around anyone who is awaiting Covid 19 test results in the past 7 to 10 days? . Have you been around anyone who has been exposed to Covid 19, or has mentioned symptoms of Covid 19 within the past 7 to 10 days?  If you have any concerns/questions about symptoms patients report during screening (either on the phone or at threshold). Contact the provider seeing the patient or DOD for further guidance.  If neither are available contact a member of the leadership team.

## 2019-06-30 ENCOUNTER — Other Ambulatory Visit: Payer: Self-pay

## 2019-06-30 ENCOUNTER — Ambulatory Visit (INDEPENDENT_AMBULATORY_CARE_PROVIDER_SITE_OTHER): Payer: Medicare Other | Admitting: *Deleted

## 2019-06-30 DIAGNOSIS — I5022 Chronic systolic (congestive) heart failure: Secondary | ICD-10-CM

## 2019-06-30 LAB — CUP PACEART INCLINIC DEVICE CHECK
Battery Voltage: 2.89 V
Date Time Interrogation Session: 20200707151000
HighPow Impedance: 71 Ohm
Implantable Lead Implant Date: 20060927
Implantable Lead Location: 753860
Implantable Lead Model: 351
Implantable Lead Serial Number: 10260064
Implantable Pulse Generator Implant Date: 20120413
Lead Channel Impedance Value: 509 Ohm
Lead Channel Pacing Threshold Amplitude: 0.7 V
Lead Channel Pacing Threshold Pulse Width: 0.4 ms
Lead Channel Sensing Intrinsic Amplitude: 12.4 mV
Lead Channel Setting Pacing Amplitude: 2 V
Lead Channel Setting Pacing Pulse Width: 0.4 ms
Lead Channel Setting Sensing Sensitivity: 0.8 mV
Pulse Gen Serial Number: 60613417

## 2019-06-30 NOTE — Progress Notes (Signed)
ICD check in clinic. Normal device function. Thresholds and sensing consistent with previous device measurements. Impedance trends stable over time. 7 "SVT" episodes, available EGMs appear to be irregular VS. 1 VF episode, 1 aborted shock. ATP unable to terminate episode, rate slowed below detection. Histogram distribution appropriate for patient and level of activity. VT1 zone rates increased to 171bpm, VT2 zone rate @ 200bpm (ATP burst x2 ramp x2 w/ industry assistance), VF zone rate @ 231bpm per Dr. Lovena Le. Polarity for shock path changed to alternating per industry recommendation. Device programmed at appropriate safety margins. Device programmed to optimize intrinsic conduction. Device @ ERI, d/t be changed 07/2019. Pt enrolled in remote follow-up. Plan to check next remote in 35months, ROV in 5-61months per Dr. Lovena Le note on 06/11/19. Patient education completed including shock plan.

## 2019-07-02 ENCOUNTER — Encounter: Payer: Self-pay | Admitting: Cardiology

## 2019-07-02 NOTE — Progress Notes (Signed)
Remote ICD transmission.   

## 2019-07-16 ENCOUNTER — Other Ambulatory Visit: Payer: Self-pay | Admitting: Internal Medicine

## 2019-07-20 ENCOUNTER — Inpatient Hospital Stay (HOSPITAL_COMMUNITY)
Admission: EM | Admit: 2019-07-20 | Discharge: 2019-08-21 | DRG: 908 | Disposition: A | Discharge status: Skilled Nursing Facility | Payer: Medicare Other | Attending: Internal Medicine | Admitting: Internal Medicine

## 2019-07-20 ENCOUNTER — Encounter (HOSPITAL_COMMUNITY): Payer: Self-pay | Admitting: Emergency Medicine

## 2019-07-20 ENCOUNTER — Emergency Department (HOSPITAL_COMMUNITY): Payer: Medicare Other

## 2019-07-20 ENCOUNTER — Other Ambulatory Visit: Payer: Self-pay

## 2019-07-20 DIAGNOSIS — D6851 Activated protein C resistance: Secondary | ICD-10-CM | POA: Diagnosis not present

## 2019-07-20 DIAGNOSIS — I493 Ventricular premature depolarization: Secondary | ICD-10-CM | POA: Diagnosis not present

## 2019-07-20 DIAGNOSIS — K91840 Postprocedural hemorrhage and hematoma of a digestive system organ or structure following a digestive system procedure: Secondary | ICD-10-CM | POA: Diagnosis not present

## 2019-07-20 DIAGNOSIS — D689 Coagulation defect, unspecified: Secondary | ICD-10-CM

## 2019-07-20 DIAGNOSIS — K1379 Other lesions of oral mucosa: Secondary | ICD-10-CM

## 2019-07-20 DIAGNOSIS — D682 Hereditary deficiency of other clotting factors: Secondary | ICD-10-CM

## 2019-07-20 DIAGNOSIS — G40909 Epilepsy, unspecified, not intractable, without status epilepticus: Secondary | ICD-10-CM | POA: Diagnosis present

## 2019-07-20 DIAGNOSIS — Z8673 Personal history of transient ischemic attack (TIA), and cerebral infarction without residual deficits: Secondary | ICD-10-CM

## 2019-07-20 DIAGNOSIS — N189 Chronic kidney disease, unspecified: Secondary | ICD-10-CM | POA: Diagnosis not present

## 2019-07-20 DIAGNOSIS — R51 Headache: Secondary | ICD-10-CM | POA: Diagnosis not present

## 2019-07-20 DIAGNOSIS — Z98818 Other dental procedure status: Secondary | ICD-10-CM | POA: Diagnosis not present

## 2019-07-20 DIAGNOSIS — I5022 Chronic systolic (congestive) heart failure: Secondary | ICD-10-CM | POA: Diagnosis present

## 2019-07-20 DIAGNOSIS — D649 Anemia, unspecified: Secondary | ICD-10-CM | POA: Diagnosis not present

## 2019-07-20 DIAGNOSIS — N183 Chronic kidney disease, stage 3 unspecified: Secondary | ICD-10-CM | POA: Diagnosis present

## 2019-07-20 DIAGNOSIS — R609 Edema, unspecified: Secondary | ICD-10-CM | POA: Diagnosis present

## 2019-07-20 DIAGNOSIS — R0902 Hypoxemia: Secondary | ICD-10-CM

## 2019-07-20 DIAGNOSIS — Z8614 Personal history of Methicillin resistant Staphylococcus aureus infection: Secondary | ICD-10-CM | POA: Diagnosis not present

## 2019-07-20 DIAGNOSIS — I509 Heart failure, unspecified: Secondary | ICD-10-CM | POA: Diagnosis not present

## 2019-07-20 DIAGNOSIS — I959 Hypotension, unspecified: Secondary | ICD-10-CM | POA: Diagnosis not present

## 2019-07-20 DIAGNOSIS — N289 Disorder of kidney and ureter, unspecified: Secondary | ICD-10-CM

## 2019-07-20 DIAGNOSIS — N400 Enlarged prostate without lower urinary tract symptoms: Secondary | ICD-10-CM | POA: Diagnosis present

## 2019-07-20 DIAGNOSIS — E875 Hyperkalemia: Secondary | ICD-10-CM | POA: Diagnosis present

## 2019-07-20 DIAGNOSIS — I9581 Postprocedural hypotension: Secondary | ICD-10-CM | POA: Diagnosis present

## 2019-07-20 DIAGNOSIS — E162 Hypoglycemia, unspecified: Secondary | ICD-10-CM | POA: Diagnosis not present

## 2019-07-20 DIAGNOSIS — D62 Acute posthemorrhagic anemia: Secondary | ICD-10-CM | POA: Diagnosis not present

## 2019-07-20 DIAGNOSIS — E785 Hyperlipidemia, unspecified: Secondary | ICD-10-CM | POA: Diagnosis not present

## 2019-07-20 DIAGNOSIS — Z951 Presence of aortocoronary bypass graft: Secondary | ICD-10-CM

## 2019-07-20 DIAGNOSIS — R5381 Other malaise: Secondary | ICD-10-CM | POA: Diagnosis present

## 2019-07-20 DIAGNOSIS — Z515 Encounter for palliative care: Secondary | ICD-10-CM

## 2019-07-20 DIAGNOSIS — R569 Unspecified convulsions: Secondary | ICD-10-CM

## 2019-07-20 DIAGNOSIS — Z95810 Presence of automatic (implantable) cardiac defibrillator: Secondary | ICD-10-CM

## 2019-07-20 DIAGNOSIS — R531 Weakness: Secondary | ICD-10-CM | POA: Diagnosis not present

## 2019-07-20 DIAGNOSIS — K068 Other specified disorders of gingiva and edentulous alveolar ridge: Secondary | ICD-10-CM | POA: Diagnosis present

## 2019-07-20 DIAGNOSIS — I255 Ischemic cardiomyopathy: Secondary | ICD-10-CM | POA: Diagnosis not present

## 2019-07-20 DIAGNOSIS — I472 Ventricular tachycardia: Secondary | ICD-10-CM | POA: Diagnosis not present

## 2019-07-20 DIAGNOSIS — R946 Abnormal results of thyroid function studies: Secondary | ICD-10-CM | POA: Diagnosis present

## 2019-07-20 DIAGNOSIS — K5900 Constipation, unspecified: Secondary | ICD-10-CM | POA: Diagnosis present

## 2019-07-20 DIAGNOSIS — Z86718 Personal history of other venous thrombosis and embolism: Secondary | ICD-10-CM

## 2019-07-20 DIAGNOSIS — Z20828 Contact with and (suspected) exposure to other viral communicable diseases: Secondary | ICD-10-CM | POA: Diagnosis present

## 2019-07-20 DIAGNOSIS — Z8249 Family history of ischemic heart disease and other diseases of the circulatory system: Secondary | ICD-10-CM

## 2019-07-20 DIAGNOSIS — I10 Essential (primary) hypertension: Secondary | ICD-10-CM | POA: Diagnosis not present

## 2019-07-20 DIAGNOSIS — I2510 Atherosclerotic heart disease of native coronary artery without angina pectoris: Secondary | ICD-10-CM | POA: Diagnosis not present

## 2019-07-20 DIAGNOSIS — T829XXA Unspecified complication of cardiac and vascular prosthetic device, implant and graft, initial encounter: Secondary | ICD-10-CM

## 2019-07-20 DIAGNOSIS — I4891 Unspecified atrial fibrillation: Secondary | ICD-10-CM | POA: Diagnosis not present

## 2019-07-20 DIAGNOSIS — F0390 Unspecified dementia without behavioral disturbance: Secondary | ICD-10-CM | POA: Diagnosis present

## 2019-07-20 DIAGNOSIS — R451 Restlessness and agitation: Secondary | ICD-10-CM | POA: Diagnosis not present

## 2019-07-20 DIAGNOSIS — Z87891 Personal history of nicotine dependence: Secondary | ICD-10-CM | POA: Diagnosis not present

## 2019-07-20 DIAGNOSIS — S0990XA Unspecified injury of head, initial encounter: Secondary | ICD-10-CM | POA: Diagnosis not present

## 2019-07-20 DIAGNOSIS — R58 Hemorrhage, not elsewhere classified: Secondary | ICD-10-CM | POA: Diagnosis not present

## 2019-07-20 DIAGNOSIS — I130 Hypertensive heart and chronic kidney disease with heart failure and stage 1 through stage 4 chronic kidney disease, or unspecified chronic kidney disease: Secondary | ICD-10-CM | POA: Diagnosis present

## 2019-07-20 DIAGNOSIS — R42 Dizziness and giddiness: Secondary | ICD-10-CM | POA: Diagnosis not present

## 2019-07-20 LAB — COMPREHENSIVE METABOLIC PANEL
ALT: 17 U/L (ref 0–44)
AST: 17 U/L (ref 15–41)
Albumin: 3 g/dL — ABNORMAL LOW (ref 3.5–5.0)
Alkaline Phosphatase: 65 U/L (ref 38–126)
Anion gap: 8 (ref 5–15)
BUN: 60 mg/dL — ABNORMAL HIGH (ref 8–23)
CO2: 25 mmol/L (ref 22–32)
Calcium: 8.1 mg/dL — ABNORMAL LOW (ref 8.9–10.3)
Chloride: 100 mmol/L (ref 98–111)
Creatinine, Ser: 1.62 mg/dL — ABNORMAL HIGH (ref 0.61–1.24)
GFR calc Af Amer: 49 mL/min — ABNORMAL LOW (ref >60)
GFR calc non Af Amer: 42 mL/min — ABNORMAL LOW (ref >60)
Glucose, Bld: 114 mg/dL — ABNORMAL HIGH (ref 70–99)
Potassium: 4.3 mmol/L (ref 3.5–5.1)
Sodium: 133 mmol/L — ABNORMAL LOW (ref 135–145)
Total Bilirubin: 0.5 mg/dL (ref 0.3–1.2)
Total Protein: 7.2 g/dL (ref 6.5–8.1)

## 2019-07-20 LAB — CBC WITH DIFFERENTIAL/PLATELET
Abs Immature Granulocytes: 0.03 10*3/uL (ref 0.00–0.07)
Basophils Absolute: 0 10*3/uL (ref 0.0–0.1)
Basophils Relative: 0 %
Eosinophils Absolute: 0.1 10*3/uL (ref 0.0–0.5)
Eosinophils Relative: 2 %
HCT: 25.9 % — ABNORMAL LOW (ref 39.0–52.0)
Hemoglobin: 8.3 g/dL — ABNORMAL LOW (ref 13.0–17.0)
Immature Granulocytes: 1 %
Lymphocytes Relative: 14 %
Lymphs Abs: 0.8 10*3/uL (ref 0.7–4.0)
MCH: 30.4 pg (ref 26.0–34.0)
MCHC: 32 g/dL (ref 30.0–36.0)
MCV: 94.9 fL (ref 80.0–100.0)
Monocytes Absolute: 0.8 10*3/uL (ref 0.1–1.0)
Monocytes Relative: 14 %
Neutro Abs: 3.9 10*3/uL (ref 1.7–7.7)
Neutrophils Relative %: 69 %
Platelets: 126 10*3/uL — ABNORMAL LOW (ref 150–400)
RBC: 2.73 MIL/uL — ABNORMAL LOW (ref 4.22–5.81)
RDW: 13.8 % (ref 11.5–15.5)
WBC: 5.6 10*3/uL (ref 4.0–10.5)
nRBC: 0 % (ref 0.0–0.2)

## 2019-07-20 LAB — VALPROIC ACID LEVEL: Valproic Acid Lvl: 22 ug/mL — ABNORMAL LOW (ref 50.0–100.0)

## 2019-07-20 LAB — SARS CORONAVIRUS 2 BY RT PCR (HOSPITAL ORDER, PERFORMED IN ~~LOC~~ HOSPITAL LAB): SARS Coronavirus 2: NEGATIVE

## 2019-07-20 LAB — PREPARE RBC (CROSSMATCH)

## 2019-07-20 MED ORDER — AMOXICILLIN 500 MG PO CAPS
500.0000 mg | ORAL_CAPSULE | Freq: Two times a day (BID) | ORAL | Status: DC
  Administered 2019-07-20: 500 mg via ORAL
  Filled 2019-07-20 (×2): qty 1

## 2019-07-20 MED ORDER — AMIODARONE HCL 200 MG PO TABS
200.0000 mg | ORAL_TABLET | Freq: Every day | ORAL | Status: DC
  Administered 2019-07-21 – 2019-08-19 (×30): 200 mg via ORAL
  Filled 2019-07-20 (×30): qty 1

## 2019-07-20 MED ORDER — PHENYTOIN SODIUM EXTENDED 100 MG PO CAPS
100.0000 mg | ORAL_CAPSULE | Freq: Two times a day (BID) | ORAL | Status: DC
  Administered 2019-07-20 – 2019-07-25 (×11): 100 mg via ORAL
  Filled 2019-07-20 (×12): qty 1

## 2019-07-20 MED ORDER — ONDANSETRON HCL 4 MG/2ML IJ SOLN
4.0000 mg | Freq: Four times a day (QID) | INTRAMUSCULAR | Status: DC | PRN
  Administered 2019-07-27 – 2019-08-08 (×2): 4 mg via INTRAVENOUS
  Filled 2019-07-20: qty 2

## 2019-07-20 MED ORDER — ACETAMINOPHEN 325 MG PO TABS
650.0000 mg | ORAL_TABLET | Freq: Four times a day (QID) | ORAL | Status: DC | PRN
  Administered 2019-07-25 – 2019-08-18 (×8): 650 mg via ORAL
  Filled 2019-07-20 (×9): qty 2

## 2019-07-20 MED ORDER — LEVETIRACETAM 500 MG PO TABS
1000.0000 mg | ORAL_TABLET | Freq: Two times a day (BID) | ORAL | Status: DC
  Administered 2019-07-20 – 2019-07-25 (×11): 1000 mg via ORAL
  Filled 2019-07-20 (×12): qty 2

## 2019-07-20 MED ORDER — SIMVASTATIN 10 MG PO TABS
20.0000 mg | ORAL_TABLET | Freq: Every day | ORAL | Status: DC
  Administered 2019-07-21 – 2019-07-24 (×3): 20 mg via ORAL
  Filled 2019-07-20 (×3): qty 2

## 2019-07-20 MED ORDER — TAMSULOSIN HCL 0.4 MG PO CAPS
0.4000 mg | ORAL_CAPSULE | Freq: Every day | ORAL | Status: DC
  Administered 2019-07-21 – 2019-07-28 (×7): 0.4 mg via ORAL
  Filled 2019-07-20 (×8): qty 1

## 2019-07-20 MED ORDER — TRAMADOL HCL 50 MG PO TABS: 50.0000 mg | ORAL_TABLET | Freq: Four times a day (QID) | ORAL | Status: DC | PRN

## 2019-07-20 MED ORDER — FUROSEMIDE 40 MG PO TABS
40.0000 mg | ORAL_TABLET | Freq: Every day | ORAL | Status: DC
  Administered 2019-07-20 – 2019-07-24 (×5): 40 mg via ORAL
  Filled 2019-07-20 (×5): qty 1

## 2019-07-20 MED ORDER — POLYETHYLENE GLYCOL 3350 17 G PO PACK: 17.0000 g | PACK | Freq: Every day | ORAL | Status: DC | PRN

## 2019-07-20 MED ORDER — DULOXETINE HCL 30 MG PO CPEP
30.0000 mg | ORAL_CAPSULE | Freq: Every day | ORAL | Status: DC
  Administered 2019-07-20 – 2019-07-25 (×6): 30 mg via ORAL
  Filled 2019-07-20 (×6): qty 1

## 2019-07-20 MED ORDER — CARVEDILOL 12.5 MG PO TABS
12.5000 mg | ORAL_TABLET | Freq: Two times a day (BID) | ORAL | Status: DC
  Administered 2019-07-21 – 2019-07-24 (×5): 12.5 mg via ORAL
  Filled 2019-07-20 (×6): qty 1

## 2019-07-20 MED ORDER — ACETAMINOPHEN-CODEINE #3 300-30 MG PO TABS
1.0000 | ORAL_TABLET | Freq: Four times a day (QID) | ORAL | Status: DC | PRN
  Administered 2019-07-22 – 2019-07-24 (×2): 1 via ORAL
  Filled 2019-07-20 (×2): qty 1

## 2019-07-20 MED ORDER — ONDANSETRON HCL 4 MG PO TABS
4.0000 mg | ORAL_TABLET | Freq: Four times a day (QID) | ORAL | Status: DC | PRN
  Filled 2019-07-20: qty 1

## 2019-07-20 MED ORDER — SODIUM CHLORIDE 0.9% IV SOLUTION
Freq: Once | INTRAVENOUS | Status: AC
  Administered 2019-07-20: 18:00:00 via INTRAVENOUS

## 2019-07-20 MED ORDER — DIVALPROEX SODIUM ER 500 MG PO TB24
500.0000 mg | ORAL_TABLET | Freq: Every day | ORAL | Status: DC
  Administered 2019-07-20 – 2019-08-21 (×32): 500 mg via ORAL
  Filled 2019-07-20: qty 2
  Filled 2019-07-20 (×11): qty 1
  Filled 2019-07-20: qty 2
  Filled 2019-07-20 (×2): qty 1
  Filled 2019-07-20: qty 2
  Filled 2019-07-20 (×2): qty 1
  Filled 2019-07-20: qty 2
  Filled 2019-07-20 (×14): qty 1

## 2019-07-20 NOTE — Progress Notes (Signed)
Patient was transferred to [place] at 1755. Alert and oriented x 4. Room is set up, call light is within patient's reach.

## 2019-07-20 NOTE — ED Provider Notes (Addendum)
[facility] DEPT Provider Note   CSN: [id number] Arrival date & time: 07/20/19  1236    History   Chief Complaint Chief Complaint  Patient presents with   Hypotension   Weakness    HPI [facility]. is a 73 y.o. male.     HPI Patient presents with lightheadedness and dizziness.  Also some bleeding from his mouth.  Last week had multiple teeth removed.  Since then has had continued bleeding at times.  States he is been able to drink some Gatorade and soup but otherwise not had much oral intake.  Now feeling more lightheadedness and dizziness.  2 days ago states he fell and hit his head.  Has a history of atrial fibrillation and has an AICD.  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  No chest pain.  No abdominal pain.  States he feels worse when he tries to get up.  No dysuria. 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   Factor VII deficiency ([place]) 10/07/2012   GIB (gastrointestinal bleeding) 02/20/2017   History of MRSA infection 05/2011   HTN (hypertension)    Hx of adenomatous colonic polyps 02/20/2017   01/2017 - 3 cm sigmoid TV adenoma and other smaller polyps - had post-polypectomy bleed Tx w/ clips Consider repeat colonoscopy 3 yrs [name], MD, [facility]    Hyperlipidemia    implantable cardiac defibrillator-Biotronik    Device Implanted 2006; s/p gen change 03/2011 : bleeding persistent with pocket erosion and infection; explant and reimplant  06/2011   Ischemic cardiomyopathy    EF 15 to 20% by TTE and TEE in 09/2012.  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   Elevated TSH 10/27/2017   Weakness of both lower extremities 10/27/2017   Unsteady gait 10/27/2017   Localization-related symptomatic epilepsy and epileptic syndromes with complex partial seizures, not intractable, without status epilepticus ([place]) 10/08/2017   Insomnia 09/02/2017   Degenerative joint disease of knee, left 08/01/2017   Blood loss anemia 04/18/2017   Hx of adenomatous colonic polyps 02/20/2017   Chronic pain of left knee 01/22/2017   Chronic kidney disease (CKD), stage III (moderate) ([place]) 11/08/2016   Hypertensive heart disease without heart failure    Chronic chest wall pain 08/17/2016   Periodontal disease 05/18/2016   HLD (hyperlipidemia) 02/26/2016   CVA (cerebral infarction) 02/26/2016   Near syncope 02/26/2016   Gout 12/30/2015   Persistent atrial fibrillation with rapid ventricular response 62/26/3335   Chronic systolic CHF (congestive heart failure), NYHA class 2 ([place]) 11/06/2012   Factor VII deficiency ([place]) 10/07/2012   Seizure ([place]) 10/01/2012  Renal insufficiency 10/01/2012   Ischemic cardiomyopathy  ? additional rate component 09/29/2012   Automatic implantable cardioverter-defibrillator in situ    HTN (hypertension) 09/21/2012   Coronary artery disease involving native heart 09/21/2012    Past Surgical History:  Procedure Laterality Date   CARDIAC CATHETERIZATION N/A 10/12/2015   Procedure: Right Heart Cath;  Surgeon: [name], MD;  Location: [facility] CV LAB;   Service: Cardiovascular;  Laterality: N/A;   CARDIOVERSION  09/24/2012   Procedure: CARDIOVERSION;  Surgeon: [name], MD;  Location: [facility] ENDOSCOPY;  Service: Cardiovascular;  Laterality: N/A;   CARDIOVERSION N/A 12/25/2017   Procedure: CARDIOVERSION;  Surgeon: [name], MD;  Location: [facility] ORS;  Service: Cardiovascular;  Laterality: N/A;   COLONOSCOPY W/ POLYPECTOMY  02/13/2017   CORONARY ARTERY BYPASS GRAFT  2011   in [place] N/A 02/20/2017   Procedure: FLEXIBLE SIGMOIDOSCOPY;  Surgeon: [name], MD;  Location: [facility] ENDOSCOPY;  Service: Endoscopy;  Laterality: N/A;   FLEXIBLE SIGMOIDOSCOPY N/A 02/21/2017   Procedure: FLEXIBLE SIGMOIDOSCOPY;  Surgeon: [name], MD;  Location: [facility] ENDOSCOPY;  Service: Endoscopy;  Laterality: N/A;   ICD     TEE WITHOUT CARDIOVERSION  09/24/2012   Procedure: TRANSESOPHAGEAL ECHOCARDIOGRAM (TEE);  Surgeon: [name], MD;  Location: [facility] ENDOSCOPY;  Service: Cardiovascular;  Laterality: N/A;  dave/anesth, dl, cindy/echo         Home Medications    Prior to Admission medications   Medication Sig Start Date End Date Taking? Authorizing Provider  acetaminophen (TYLENOL) 325 MG tablet Take 650 mg by mouth every 6 (six) hours as needed for mild pain.    [provider]  amiodarone (PACERONE) 200 MG tablet Take 1 tablet by mouth once daily 05/21/19   [name], [name], MD  carvedilol (COREG) 12.5 MG tablet TAKE 1 TABLET BY MOUTH TWICE DAILY WITH A MEAL 06/19/19   [name], MD  colchicine 0.6 MG tablet Take 0.5 tablets (0.3 mg total) by mouth daily. 04/29/19   [name], MD  cyclobenzaprine (FLEXERIL) 5 MG tablet Take 1 tablet (5 mg total) by mouth 3 (three) times daily as needed for muscle spasms. 04/13/19   [name], MD  divalproex (DEPAKOTE ER) 500 MG 24 hr tablet Take 1 tablet every night Patient taking differently: Take 500 mg by mouth daily.  01/06/19   [name], MD   DULoxetine (CYMBALTA) 30 MG capsule Take 1 capsule (30 mg total) by mouth daily. 04/02/19   [name], MD  furosemide (LASIX) 40 MG tablet Take 1 tablet (40 mg total) by mouth daily. 09/23/18   [name], MD  hydrALAZINE (APRESOLINE) 25 MG tablet Take 1 tablet (25 mg total) by mouth 3 (three) times daily. 01/29/19 04/29/19  Clegg, Amy D, NP  isosorbide mononitrate (IMDUR) 30 MG 24 hr tablet Take 1 tablet (30 mg total) by mouth daily. 01/29/19 04/29/19  Clegg, Amy D, NP  levETIRAcetam (KEPPRA) 1000 MG tablet Take 1 tablet (1,000 mg total) by mouth 2 (two) times daily. 01/06/19   [name], MD  losartan (COZAAR) 25 MG tablet TAKE 1 TABLET BY MOUTH ONCE DAILY 02/26/18   Larey Dresser, MD  phenytoin (DILANTIN) 100 MG ER capsule Take 1 capsule (100 mg total) by mouth 2 (two) times daily. 01/06/19   [name], MD  polyethylene glycol [facility] / Floria Raveling) packet Take 17 g by mouth daily as needed. 02/25/17  Hosie Poisson, MD  senna-docusate (SENOKOT-S) 8.6-50 MG tablet Take 2 tablets by mouth 2 (two) times daily. 02/25/17   Hosie Poisson, MD  simvastatin (ZOCOR) 20 MG tablet TAKE 1 TABLET BY MOUTH ONCE DAILY AT 6PM 06/19/19   [name], MD  spironolactone (ALDACTONE) 25 MG tablet TAKE 1 TABLET BY MOUTH ONCE DAILY 02/05/19   [name], MD  tamsulosin [facility]) 0.4 MG CAPS capsule Take 1 capsule by mouth once daily 07/17/19   Marrian Salvage, FNP  Vitamin D, Ergocalciferol, (DRISDOL) 50000 units CAPS capsule Take 1 capsule (50,000 Units total) by mouth every 7 (seven) days. 11/28/17   [name], MD    Family History Family History  Problem Relation Age of Onset   Arthritis Mother    Heart disease Mother    Heart attack Mother    Other Father        smoker   Hypertension Neg Hx        unknown   Stroke Neg Hx        unknown    Social History Social History   Tobacco Use   Smoking status: Former Smoker    Types: Pipe    Quit date:  09/21/2008    Years since quitting: 10.8   Smokeless tobacco: Former Systems developer    Quit date: 09/21/2008  Substance Use Topics   Alcohol use: No   Drug use: No     Allergies   Patient has no known allergies.   Review of Systems Review of Systems  Constitutional: Positive for appetite change and fatigue.  HENT: Negative for congestion.        Bleeding from his teeth.  Respiratory: Negative for chest tightness.   Cardiovascular: Negative for chest pain.  Gastrointestinal: Negative for abdominal pain.  Genitourinary: Negative for flank pain.  Musculoskeletal: Negative for back pain.  Skin: Negative for rash.  Neurological: Positive for light-headedness.  Psychiatric/Behavioral: Negative for confusion.     Physical Exam Updated Vital Signs BP 95/77    Pulse 95    Temp 97.6 F (36.4 C) (Axillary)    Resp (!) 21    SpO2 100%   Physical Exam Vitals signs and nursing note reviewed.  HENT:     Head: Atraumatic.     Mouth/Throat:     Comments: Patient is edentulous.  Has some cotton in his mouth.  No active bleeding but does have some blood clots. Eyes:     Extraocular Movements: Extraocular movements intact.  Neck:     Musculoskeletal: Neck supple.  Cardiovascular:     Comments: Mild tachycardia Pulmonary:     Breath sounds: No wheezing or rhonchi.  Abdominal:     Tenderness: There is no abdominal tenderness.  Musculoskeletal:     Right lower leg: No edema.     Left lower leg: No edema.  Skin:    General: Skin is warm.     Capillary Refill: Capillary refill takes less than 2 seconds.  Neurological:     Mental Status: He is alert and oriented to person, place, and time.      ED Treatments / Results  Labs (all labs ordered are listed, but only abnormal results are displayed) Labs Reviewed  CBC WITH DIFFERENTIAL/PLATELET - Abnormal; Notable for the following components:      Result Value   RBC 2.73 (*)    Hemoglobin 8.3 (*)    HCT 25.9 (*)    Platelets 126 (*)     All  other components within normal limits  COMPREHENSIVE METABOLIC PANEL - Abnormal; Notable for the following components:   Sodium 133 (*)    Glucose, Bld 114 (*)    BUN 60 (*)    Creatinine, Ser 1.62 (*)    Calcium 8.1 (*)    Albumin 3.0 (*)    GFR calc non Af Amer 42 (*)    GFR calc Af Amer 49 (*)    All other components within normal limits  VALPROIC ACID LEVEL - Abnormal; Notable for the following components:   Valproic Acid Lvl 22 (*)    All other components within normal limits  SARS CORONAVIRUS 2 (HOSPITAL ORDER, [place] LAB)    EKG EKG Interpretation  Date/Time:  Monday July 20 2019 13:00:16 EDT Ventricular Rate:  108 PR Interval:    QRS Duration: 118 QT Interval:  389 QTC Calculation: 522 R Axis:   150 Text Interpretation:  Atrial flutter with predominant 2:1 AV block Nonspecific intraventricular conduction delay Borderline T abnormalities, lateral leads Confirmed by Davonna Belling [phone]) on 07/20/2019 1:10:19 PM   Radiology Ct Head Wo Contrast  Result Date: 07/20/2019 CLINICAL DATA:  Pain following recent trauma EXAM: CT HEAD WITHOUT CONTRAST TECHNIQUE: Contiguous axial images were obtained from the base of the skull through the vertex without intravenous contrast. COMPARISON:  December 05, 2018 FINDINGS: Brain: There is stable age related volume loss. There is no intracranial mass, hemorrhage, extra-axial fluid collection, or midline shift. There is small vessel disease in the centra semiovale bilaterally which appear stable. No acute infarct evident. Vascular: No hyperdense vessel. There is calcification in each carotid siphon region as well as in each middle cerebral artery. Skull: Bony calvarium appears intact. Sinuses/Orbits: Visualized paranasal sinuses are clear. Orbits appear symmetric bilaterally. Other: Mastoid air cells are clear. 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 No acute infarct. No mass or  hemorrhage. There are multiple foci of arterial vascular calcification. Electronically Signed   By: Lowella Grip III M.D.   On: 07/20/2019 14:12    Procedures Procedures (including critical care time)  Medications Ordered in ED Medications - No data to display   Initial Impression / Assessment and Plan / ED Course  I have reviewed the triage vital signs and the nursing notes.  Pertinent labs & imaging results that were available during my care of the patient were reviewed by me and considered in my medical decision making (see chart for details).     Patient presents with lightheadedness and dizziness.  Return of his anemia.  I think this is likely a blood loss anemia from oozing from his teeth.  Not actually having much bleeding at this time.  However he is appears to be symptomatic with that likely needs transfusion.  Will admit to hospitalist.  CRITICAL CARE Performed by: Davonna Belling Total critical care time: 30 minutes Critical care time was exclusive of separately billable procedures and treating other patients. Critical care was necessary to treat or prevent imminent or life-threatening deterioration. Critical care was time spent personally by me on the following activities: development of treatment plan with patient and/or surrogate as well as nursing, discussions with consultants, evaluation of patient's response to treatment, examination of patient, obtaining history from patient or surrogate, ordering and performing treatments and interventions, ordering and review of laboratory studies, ordering and review of radiographic studies, pulse oximetry and re-evaluation of patient's condition.  Final Clinical Impressions(s) / ED Diagnoses   Final diagnoses:  Anemia, unspecified  type    ED Discharge Orders    None       Davonna Belling, MD 07/20/19 1521    Davonna Belling, MD 08/26/19 [phone]

## 2019-07-20 NOTE — ED Notes (Signed)
Patient denies pain, but reports soreness from having his teeth pulled. RN explained to patient that they are going to do a CT scan of patient's head. Pt reports understanding.

## 2019-07-20 NOTE — Progress Notes (Signed)
Phone call to patient's wife to obtain blood transfusion consent signature as patient could not sign now with his permission. Both of them agreed to have 1 unit of blood transfused today 07/20/2019.

## 2019-07-20 NOTE — ED Notes (Signed)
ED TO INPATIENT HANDOFF REPORT  ED Nurse Name and Phone #: [phone]  S Name/Age/Gender Louis Ford. 73 y.o. male Room/Bed: WA25/WA25  Code Status   Code Status: Prior  Home/SNF/Other Home Patient oriented to: self, place, time and situation Is this baseline? Yes   Triage Complete: Triage complete  Chief Complaint hypotension weakness  Triage Note Pt c/o hypotension, and weakness, pt s/p oral surgery and 18 teeth removed last week. Pt states he has only been consuming minimal po liquids and has had "quite a bit of bleeding" per wife. EMS states pt's BS: 137. Pt states he continues to take meds. Wife: Jorey Dollard: [phone].   Pt also reports he had a syncopal episode two days ago and hit his head. Reports that he is usually on anticoagulants but was d/c two months ago in preparation for the oral surgery.   Allergies No Known Allergies  Level of Care/Admitting Diagnosis ED Disposition    ED Disposition Condition Comment   Admit  Hospital Area: [facility] [[id number]]  Level of Care: Telemetry [5]  Admit to tele based on following criteria: Monitor for Ischemic changes  Covid Evaluation: Confirmed COVID Negative  Diagnosis: Anemia due to blood loss, acute [903009]  Admitting Physician: Barb Merino [[id number]]  Attending Physician: Barb Merino [[id number]]  PT Class (Do Not Modify): Observation [104]  PT Acc Code (Do Not Modify): Observation [10022]       B Medical/Surgery History Past Medical History:  Diagnosis Date  . AICD (automatic cardioverter/defibrillator) present   . Atrial fibrillation   09/22/2012  . Blood loss anemia 04/18/2017   After GI bleed from colonoscopy and polypectomy  . CAD (coronary artery disease)   . Chronic systolic heart failure ([place])   . 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 GIB (gastrointestinal bleeding) 02/20/2017  . History of MRSA infection 05/2011  . HTN  (hypertension)   . Hx of adenomatous colonic polyps 02/20/2017   01/2017 - 3 cm sigmoid TV adenoma and other smaller polyps - had post-polypectomy bleed Tx w/ clips Consider repeat colonoscopy 3 yrs [name], MD, [name]   . Hyperlipidemia   . implantable cardiac defibrillator-Biotronik    Device Implanted 2006; s/p gen change 03/2011 : bleeding persistent with pocket erosion and infection; explant and reimplant  06/2011  . Ischemic cardiomyopathy    EF 15 to 20% by TTE and TEE in 09/2012.  Severe LV dysfunction  . Obesity    BMI 31 in 09/2012  . Persistent atrial fibrillation with rapid ventricular response 04/21/2014  . Retroperitoneal bleed 04/21/2019  . Seizure disorder ([place]) latest 09/30/2012  . Seizures ([place])   . Stroke [facility])    Past Surgical History:  Procedure Laterality Date  . CARDIAC CATHETERIZATION N/A 10/12/2015   Procedure: Right Heart Cath;  Surgeon: [name], MD;  Location: [facility] CV LAB;  Service: Cardiovascular;  Laterality: N/A;  . CARDIOVERSION  09/24/2012   Procedure: CARDIOVERSION;  Surgeon: [name], MD;  Location: [facility] ENDOSCOPY;  Service: Cardiovascular;  Laterality: N/A;  . CARDIOVERSION N/A 12/25/2017   Procedure: CARDIOVERSION;  Surgeon: [name], MD;  Location: [facility] ORS;  Service: Cardiovascular;  Laterality: N/A;  . COLONOSCOPY W/ POLYPECTOMY  02/13/2017  . CORONARY ARTERY BYPASS GRAFT  2011   in [place]  . FLEXIBLE SIGMOIDOSCOPY N/A 02/20/2017   Procedure: FLEXIBLE SIGMOIDOSCOPY;  Surgeon: [name], MD;  Location: [facility] ENDOSCOPY;  Service: Endoscopy;  Laterality: N/A;  . FLEXIBLE SIGMOIDOSCOPY N/A 02/21/2017   Procedure: FLEXIBLE SIGMOIDOSCOPY;  Surgeon: [name], MD;  Location: [facility] ENDOSCOPY;  Service: Endoscopy;  Laterality: N/A;  . ICD    . TEE WITHOUT CARDIOVERSION  09/24/2012   Procedure: TRANSESOPHAGEAL ECHOCARDIOGRAM (TEE);  Surgeon: [name], MD;  Location: [facility] ENDOSCOPY;  Service: Cardiovascular;  Laterality: N/A;   dave/anesth, dl, cindy/echo      A IV Location/Drains/Wounds Patient Lines/Drains/Airways Status   Active Line/Drains/Airways    Name:   Placement date:   Placement time:   Site:   Days:   Peripheral IV 07/20/19 Right Antecubital   07/20/19    1336    Antecubital   less than 1   External Urinary Catheter   12/20/17    1800    --   577   External Urinary Catheter   04/21/19    2000    --   90          Intake/Output Last 24 hours No intake or output data in the 24 hours ending 07/20/19 1729  Labs/Imaging Results for orders placed or performed during the hospital encounter of 07/20/19 (from the past 48 hour(s))  CBC with Differential     Status: Abnormal   Collection Time: 07/20/19  1:10 PM  Result Value Ref Range   WBC 5.6 4.0 - 10.5 K/uL   RBC 2.73 (L) 4.22 - 5.81 MIL/uL   Hemoglobin 8.3 (L) 13.0 - 17.0 g/dL   HCT 25.9 (L) 39.0 - 52.0 %   MCV 94.9 80.0 - 100.0 fL   MCH 30.4 26.0 - 34.0 pg   MCHC 32.0 30.0 - 36.0 g/dL   RDW 13.8 11.5 - 15.5 %   Platelets 126 (L) 150 - 400 K/uL   nRBC 0.0 0.0 - 0.2 %   Neutrophils Relative % 69 %   Neutro Abs 3.9 1.7 - 7.7 K/uL   Lymphocytes Relative 14 %   Lymphs Abs 0.8 0.7 - 4.0 K/uL   Monocytes Relative 14 %   Monocytes Absolute 0.8 0.1 - 1.0 K/uL   Eosinophils Relative 2 %   Eosinophils Absolute 0.1 0.0 - 0.5 K/uL   Basophils Relative 0 %   Basophils Absolute 0.0 0.0 - 0.1 K/uL   Immature Granulocytes 1 %   Abs Immature Granulocytes 0.03 0.00 - 0.07 K/uL    Comment: Performed at [facility], [place] [address] [place], [place] [address]  Comprehensive metabolic panel     Status: Abnormal   Collection Time: 07/20/19  1:10 PM  Result Value Ref Range   Sodium 133 (L) 135 - 145 mmol/L   Potassium 4.3 3.5 - 5.1 mmol/L   Chloride 100 98 - 111 mmol/L   CO2 25 22 - 32 mmol/L   Glucose, Bld 114 (H) 70 - 99 mg/dL   BUN 60 (H) 8 - 23 mg/dL   Creatinine, Ser 1.62 (H) 0.61 - 1.24 mg/dL   Calcium 8.1 (L) 8.9 - 10.3 mg/dL    Total Protein 7.2 6.5 - 8.1 g/dL   Albumin 3.0 (L) 3.5 - 5.0 g/dL   AST 17 15 - 41 U/L   ALT 17 0 - 44 U/L   Alkaline Phosphatase 65 38 - 126 U/L   Total Bilirubin 0.5 0.3 - 1.2 mg/dL   GFR calc non Af Amer 42 (L) >60 mL/min   GFR calc Af Amer 49 (L) >60 mL/min   Anion gap 8 5 - 15  Comment: Performed at [facility], [place] [address] [place], [place] [address]  Valproic acid level     Status: Abnormal   Collection Time: 07/20/19  1:10 PM  Result Value Ref Range   Valproic Acid Lvl 22 (L) 50.0 - 100.0 ug/mL    Comment: Performed at [facility], [place] [address], [place], [place] [address]  SARS Coronavirus 2 (CEPHEID - Performed in [place] hospital lab), Hosp Order     Status: None   Collection Time: 07/20/19  1:58 PM   Specimen: Nasopharyngeal Swab  Result Value Ref Range   SARS Coronavirus 2 NEGATIVE NEGATIVE    Comment: (NOTE) If result is NEGATIVE SARS-CoV-2 target nucleic acids are NOT DETECTED. The SARS-CoV-2 RNA is generally detectable in upper and lower  respiratory specimens during the acute phase of infection. The lowest  concentration of SARS-CoV-2 viral copies this assay can detect is 250  copies / mL. A negative result does not preclude SARS-CoV-2 infection  and should not be used as the sole basis for treatment or other  patient management decisions.  A negative result may occur with  improper specimen collection / handling, submission of specimen other  than nasopharyngeal swab, presence of viral mutation(s) within the  areas targeted by this assay, and inadequate number of viral copies  (<250 copies / mL). A negative result must be combined with clinical  observations, patient history, and epidemiological information. If result is POSITIVE SARS-CoV-2 target nucleic acids are DETECTED. The SARS-CoV-2 RNA is generally detectable in upper and lower  respiratory specimens dur ing the acute phase of infection.  Positive   results are indicative of active infection with SARS-CoV-2.  Clinical  correlation with patient history and other diagnostic information is  necessary to determine patient infection status.  Positive results do  not rule out bacterial infection or co-infection with other viruses. If result is PRESUMPTIVE POSTIVE SARS-CoV-2 nucleic acids MAY BE PRESENT.   A presumptive positive result was obtained on the submitted specimen  and confirmed on repeat testing.  While 2019 novel coronavirus  (SARS-CoV-2) nucleic acids may be present in the submitted sample  additional confirmatory testing may be necessary for epidemiological  and / or clinical management purposes  to differentiate between  SARS-CoV-2 and other Sarbecovirus currently known to infect humans.  If clinically indicated additional testing with an alternate test  methodology [phone]) is advised. The SARS-CoV-2 RNA is generally  detectable in upper and lower respiratory sp ecimens during the acute  phase of infection. The expected result is Negative. Fact Sheet for Patients:  StrictlyIdeas.no Fact Sheet for Healthcare Providers: BankingDealers.co.za This test is not yet approved or cleared by the [place] FDA and has been authorized for detection and/or diagnosis of SARS-CoV-2 by FDA under an Emergency Use Authorization (EUA).  This EUA will remain in effect (meaning this test can be used) for the duration of the COVID-19 declaration under Section 564(b)(1) of the Act, 21 U.S.C. section 360bbb-3(b)(1), unless the authorization is terminated or revoked sooner. Performed at [facility], [place] [address], [place], [place] [address]    Ct Head Wo Contrast  Result Date: 07/20/2019 CLINICAL DATA:  Pain following recent trauma EXAM: CT HEAD WITHOUT CONTRAST TECHNIQUE: Contiguous axial images were obtained from the base of the skull through the vertex without  intravenous contrast. COMPARISON:  December 05, 2018 FINDINGS: Brain: There is stable age related volume loss. There is no intracranial mass, hemorrhage, extra-axial fluid collection, or  midline shift. There is small vessel disease in the centra semiovale bilaterally which appear stable. No acute infarct evident. Vascular: No hyperdense vessel. There is calcification in each carotid siphon region as well as in each middle cerebral artery. Skull: Bony calvarium appears intact. Sinuses/Orbits: Visualized paranasal sinuses are clear. Orbits appear symmetric bilaterally. Other: Mastoid air cells are clear. 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 No acute infarct. No mass or hemorrhage. There are multiple foci of arterial vascular calcification. Electronically Signed   By: Lowella Grip III M.D.   On: 07/20/2019 14:12    Pending Labs Unresulted Labs (From admission, onward)    Start     Ordered   07/20/19 1656  Type and screen [place]  Once,   STAT    Comments: [place]    07/20/19 1656   07/20/19 1656  Prepare RBC  (Adult Blood Administration - Red Blood Cells)  Once,   R    Question Answer Comment  # of Units 1 unit   Transfusion Indications Symptomatic Anemia   If emergent release call blood bank Not emergent release   Instructions: Transfuse      07/20/19 1656   Signed and Held  Basic metabolic panel  Tomorrow morning,   R     Signed and Held   Signed and Held  CBC  Tomorrow morning,   R     Signed and Held          Vitals/Pain Today's Vitals   07/20/19 1245 07/20/19 1248 07/20/19 1300 07/20/19 1500  BP: (!) 134/115  95/77 104/73  Pulse: (!) 102  95 100  Resp: 19  (!) 21 (!) 22  Temp: 97.6 F (36.4 C)     TempSrc: Axillary     SpO2: 97%  100% 98%  PainSc:  5       Isolation Precautions No active isolations  Medications Medications  0.9 %  sodium chloride infusion (Manually program via Guardrails IV  Fluids) (has no administration in time range)    Mobility walks with person assist High fall risk   Focused Assessments Anemia   R Recommendations: See Admitting Provider Note  Report given to:   Additional Notes: 18 teeth recently removed

## 2019-07-20 NOTE — ED Triage Notes (Signed)
Pt also reports he had a syncopal episode two days ago and hit his head. Reports that he is usually on anticoagulants but was d/c two months ago in preparation for the oral surgery.

## 2019-07-20 NOTE — ED Notes (Signed)
ED Provider at bedside. 

## 2019-07-20 NOTE — H&P (Signed)
History and Physical    [facility]. DGU:[id number] DOB: 04/24/1946 DOA: 07/20/2019  PCP: [name], MD  Patient coming from: home   I have personally briefly reviewed patient's old medical records available.   Chief Complaint: weakness, low blood pressures and mouth bleeding   HPI: [facility]. is a 73 y.o. 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  Patient is poor historian.  Wife could not pick up the phone.  I reviewed his previous admissions.  According to the patient, he has not been on blood thinners since he had that retroperitoneal bleed about 2 months ago.  He had been otherwise doing well.  He walks with a walker.  Also has a history of seizure and is on multiple medications.  He went to dentist and had almost all of his teeth removed on 07/08/2019, following that he had continued bleeding into his mouth and they had talked to their dentist who recommended to control with a gauze piece.  His wife has been trying to feed him with Gatorade and chicken soup but otherwise he has not been able to eat much.  Patient states that 2 days ago he fell and hit his head had some pain but improved now. The reason he came to the hospital today is feeling dizzy and blood pressure dropping on ambulation. ED Course: Patient is afebrile.  On room air.  Blood pressure are fairly stable on supine position.  Blood pressure drops more than 20 points on standing.  Hemoglobin 8.3 which has been fluctuating.  His baseline hemoglobin was 16.  He had a severe retroperitoneal bleed and hemoglobin had been  about 9.  Creatinine 1.67 which is about his baseline. With patient's orthostatic hypotension, multiple medical problems and symptomatic anemia admission was requested for monitoring in the hospital.  Review of Systems: all systems are reviewed and pertinent positive as per HPI otherwise rest are negative.    Past Medical History:  Diagnosis Date  . AICD (automatic cardioverter/defibrillator) present   . Atrial fibrillation   09/22/2012  . Blood loss anemia 04/18/2017   After GI bleed from colonoscopy and polypectomy  . CAD (coronary artery disease)   . Chronic systolic heart failure ([place])   . Factor VII deficiency ([place]) 05/2011  . Factor VII deficiency ([place]) 10/07/2012  . GIB (gastrointestinal bleeding) 02/20/2017  . History of MRSA infection 05/2011  . HTN (hypertension)   . Hx of adenomatous colonic polyps 02/20/2017   01/2017 - 3 cm sigmoid TV adenoma and other smaller polyps - had post-polypectomy bleed Tx w/ clips Consider repeat colonoscopy 3 yrs [name], MD, [name]   . Hyperlipidemia   . implantable cardiac defibrillator-Biotronik    Device Implanted 2006; s/p gen change 03/2011 : bleeding persistent with pocket erosion and infection; explant and reimplant  06/2011  . Ischemic cardiomyopathy    EF 15 to 20% by TTE and TEE in 09/2012.  Severe LV dysfunction  . Obesity    BMI 31 in 09/2012  . Persistent atrial fibrillation with rapid ventricular response 04/21/2014  . Retroperitoneal bleed 04/21/2019  . Seizure disorder ([place]) latest 09/30/2012  . Seizures ([place])   . Stroke (  [facility])     Past Surgical History:  Procedure Laterality Date  . CARDIAC CATHETERIZATION N/A 10/12/2015   Procedure: Right Heart Cath;  Surgeon: [name], MD;  Location: [place] CV LAB;  Service: Cardiovascular;  Laterality: N/A;  . CARDIOVERSION  09/24/2012   Procedure: CARDIOVERSION;  Surgeon: [name], MD;  Location: [facility] ENDOSCOPY;  Service: Cardiovascular;  Laterality: N/A;  .  CARDIOVERSION N/A 12/25/2017   Procedure: CARDIOVERSION;  Surgeon: [name], MD;  Location: [facility] ORS;  Service: Cardiovascular;  Laterality: N/A;  . COLONOSCOPY W/ POLYPECTOMY  02/13/2017  . CORONARY ARTERY BYPASS GRAFT  2011   in [place]  . FLEXIBLE SIGMOIDOSCOPY N/A 02/20/2017   Procedure: FLEXIBLE SIGMOIDOSCOPY;  Surgeon: [name], MD;  Location: [facility] ENDOSCOPY;  Service: Endoscopy;  Laterality: N/A;  . FLEXIBLE SIGMOIDOSCOPY N/A 02/21/2017   Procedure: FLEXIBLE SIGMOIDOSCOPY;  Surgeon: [name], MD;  Location[facility] ENDOSCOPY;  Service: Endoscopy;  Laterality: N/A;  . ICD    . TEE WITHOUT CARDIOVERSION  09/24/2012   Procedure: TRANSESOPHAGEAL ECHOCARDIOGRAM (TEE);  Surgeon: [name], MD;  Location: [facility] ENDOSCOPY;  Service: Cardiovascular;  Laterality: N/A;  dave/anesth, dl, cindy/echo      reports that he quit smoking about 10 years ago. His smoking use included pipe. He quit smokeless tobacco use about 10 years ago. He reports that he does not drink alcohol or use drugs.  No Known Allergies  Family History  Problem Relation Age of Onset  . Arthritis Mother   . Heart disease Mother   . Heart attack Mother   . Other Father        smoker  . Hypertension Neg Hx        unknown  . Stroke Neg Hx        unknown     Prior to Admission medications   Medication Sig Start Date End Date Taking? Authorizing Provider  acetaminophen-codeine (TYLENOL #3) 300-30 MG tablet Take 1 tablet by mouth every 6 (six) hours as needed for moderate pain or severe pain.  07/16/19  Yes [provider]  amiodarone (PACERONE) 200 MG tablet Take 1 tablet by mouth once daily 05/21/19  Yes [name], [name], MD  amoxicillin (AMOXIL) 500 MG capsule Take 500 mg by mouth 2 (two) times daily.  07/16/19  Yes [provider]  carvedilol (COREG) 12.5 MG tablet TAKE 1 TABLET BY MOUTH TWICE DAILY WITH A MEAL 06/19/19  Yes [name], MD  divalproex (DEPAKOTE ER) 500 MG 24 hr tablet Take  1 tablet every night Patient taking differently: Take 500 mg by mouth daily.  01/06/19  Yes [name], MD  DULoxetine (CYMBALTA) 30 MG capsule Take 1 capsule (30 mg total) by mouth daily. 04/02/19  Yes [name], MD  furosemide (LASIX) 40 MG tablet Take 1 tablet (40 mg total) by mouth daily. 09/23/18  Yes [name], MD  hydrALAZINE (APRESOLINE) 25 MG tablet Take 25 mg by mouth 3 (three) times daily.   Yes [provider]  isosorbide mononitrate (IMDUR) 30 MG 24 hr tablet Take 30 mg by mouth daily.   Yes [provider]  levETIRAcetam (KEPPRA) 1000 MG tablet Take 1 tablet (1,000 mg total) by mouth 2 (two) times daily. 01/06/19  Yes [name], MD  losartan (COZAAR) 25 MG tablet TAKE 1 TABLET BY MOUTH ONCE DAILY 02/26/18  Yes Larey Dresser, MD  phenytoin (DILANTIN) 100 MG ER capsule Take 1 capsule (100  mg total) by mouth 2 (two) times daily. 01/06/19  Yes [name], MD  simvastatin (ZOCOR) 20 MG tablet TAKE 1 TABLET BY MOUTH ONCE DAILY AT 6PM 06/19/19  Yes [name], MD  spironolactone (ALDACTONE) 25 MG tablet TAKE 1 TABLET BY MOUTH ONCE DAILY 02/05/19  Yes [name], MD  tamsulosin [facility]) 0.4 MG CAPS capsule Take 1 capsule by mouth once daily 07/17/19  Yes Marrian Salvage, FNP  acetaminophen (TYLENOL) 325 MG tablet Take 650 mg by mouth every 6 (six) hours as needed for mild pain.    [provider]  hydrALAZINE (APRESOLINE) 25 MG tablet Take 1 tablet (25 mg total) by mouth 3 (three) times daily. 01/29/19 04/29/19  Clegg, Amy D, NP  isosorbide mononitrate (IMDUR) 30 MG 24 hr tablet Take 1 tablet (30 mg total) by mouth daily. 01/29/19 04/29/19  Clegg, Amy D, NP  polyethylene glycol (MIRALAX / GLYCOLAX) packet Take 17 g by mouth daily as needed. Patient taking differently: Take 17 g by mouth daily as needed for moderate constipation.  02/25/17   Hosie Poisson, MD  traMADol (ULTRAM) 50 MG tablet Take 50 mg by mouth every 6 (six)  hours as needed for moderate pain. for pain 07/01/19   [provider]    Physical Exam: Vitals:   07/20/19 1245 07/20/19 1300 07/20/19 1500  BP: (!) 134/115 95/77 104/73  Pulse: (!) 102 95 100  Resp: 19 (!) 21 (!) 22  Temp: 97.6 F (36.4 C)    TempSrc: Axillary    SpO2: 97% 100% 98%    Constitutional: NAD, calm, comfortable Vitals:   07/20/19 1245 07/20/19 1300 07/20/19 1500  BP: (!) 134/115 95/77 104/73  Pulse: (!) 102 95 100  Resp: 19 (!) 21 (!) 22  Temp: 97.6 F (36.4 C)    TempSrc: Axillary    SpO2: 97% 100% 98%   Eyes: PERRL, lids and conjunctivae normal, chronically sick looking but currently comfortable on room air. ENMT: Mucous membranes are moist. Posterior pharynx clear of any exudate or lesions. He has multiple sutures on his dental line, there is no active bleeding.  He has 2 gauze pieces in his mouth they are soaked with mucus and some blood, however there is no evidence of active bleeding on removal. Neck: normal, supple, no masses, no thyromegaly Respiratory: clear to auscultation bilaterally, no wheezing, no crackles. Normal respiratory effort. No accessory muscle use.   He has AICD on his right anterior chest. Cardiovascular: Regular rate and rhythm, no murmurs / rubs / gallops. No extremity edema. 2+ pedal pulses. No carotid bruits.  Abdomen: no tenderness, no masses palpated. No hepatosplenomegaly. Bowel sounds positive.  Musculoskeletal: no clubbing / cyanosis. No joint deformity upper and lower extremities. Good ROM, no contractures. Normal muscle tone.  Skin: no rashes, lesions, ulcers. No induration Neurologic: CN 2-12 grossly intact. Sensation intact, DTR normal. Strength 5/5 in all 4.  Psychiatric: Normal judgment and insight. Alert and oriented x 3. Normal mood.     Labs on Admission: I have personally reviewed following labs and imaging studies  CBC: Recent Labs  Lab 07/20/19 1310  WBC 5.6  NEUTROABS 3.9  HGB 8.3*  HCT 25.9*  MCV  94.9  PLT 275*   Basic Metabolic Panel: Recent Labs  Lab 07/20/19 1310  NA 133*  K 4.3  CL 100  CO2 25  GLUCOSE 114*  BUN 60*  CREATININE 1.62*  CALCIUM 8.1*   GFR: CrCl cannot be calculated (Unknown ideal  weight.). Liver Function Tests: Recent Labs  Lab 07/20/19 1310  AST 17  ALT 17  ALKPHOS 65  BILITOT 0.5  PROT 7.2  ALBUMIN 3.0*   No results for input(s): LIPASE, AMYLASE in the last 168 hours. No results for input(s): AMMONIA in the last 168 hours. Coagulation Profile: No results for input(s): INR, PROTIME in the last 168 hours. Cardiac Enzymes: No results for input(s): CKTOTAL, CKMB, CKMBINDEX, TROPONINI in the last 168 hours. BNP (last 3 results) Recent Labs    11/26/18 1025  PROBNP 56.0   HbA1C: No results for input(s): HGBA1C in the last 72 hours. CBG: No results for input(s): GLUCAP in the last 168 hours. Lipid Profile: No results for input(s): CHOL, HDL, LDLCALC, TRIG, CHOLHDL, LDLDIRECT in the last 72 hours. Thyroid Function Tests: No results for input(s): TSH, T4TOTAL, FREET4, T3FREE, THYROIDAB in the last 72 hours. Anemia Panel: No results for input(s): VITAMINB12, FOLATE, FERRITIN, TIBC, IRON, RETICCTPCT in the last 72 hours. Urine analysis:    Component Value Date/Time   COLORURINE YELLOW 04/23/2019 1915   APPEARANCEUR CLOUDY (A) 04/23/2019 1915   LABSPEC 1.017 04/23/2019 1915   PHURINE 6.0 04/23/2019 1915   GLUCOSEU NEGATIVE 04/23/2019 1915   HGBUR SMALL (A) 04/23/2019 1915   BILIRUBINUR NEGATIVE 04/23/2019 1915   BILIRUBINUR Neg 11/27/2017 1032   KETONESUR NEGATIVE 04/23/2019 1915   PROTEINUR 30 (A) 04/23/2019 1915   UROBILINOGEN 0.2 11/27/2017 1032   UROBILINOGEN 1.0 10/13/2013 1855   NITRITE NEGATIVE 04/23/2019 1915   LEUKOCYTESUR LARGE (A) 04/23/2019 1915    Radiological Exams on Admission: Ct Head Wo Contrast  Result Date: 07/20/2019 CLINICAL DATA:  Pain following recent trauma EXAM: CT HEAD WITHOUT CONTRAST TECHNIQUE:  Contiguous axial images were obtained from the base of the skull through the vertex without intravenous contrast. COMPARISON:  December 05, 2018 FINDINGS: Brain: There is stable age related volume loss. There is no intracranial mass, hemorrhage, extra-axial fluid collection, or midline shift. There is small vessel disease in the centra semiovale bilaterally which appear stable. No acute infarct evident. Vascular: No hyperdense vessel. There is calcification in each carotid siphon region as well as in each middle cerebral artery. Skull: Bony calvarium appears intact. Sinuses/Orbits: Visualized paranasal sinuses are clear. Orbits appear symmetric bilaterally. Other: Mastoid air cells are clear. 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 No acute infarct. No mass or hemorrhage. There are multiple foci of arterial vascular calcification. Electronically Signed   By: Lowella Grip III M.D.   On: 07/20/2019 14:12    EKG: Independently reviewed.  A flutter with 2-1 block.  No acute changes.  Assessment/Plan Principal Problem:   Anemia due to blood loss, acute Active Problems:   HTN (hypertension)   Coronary artery disease involving native heart   Automatic implantable cardioverter-defibrillator in situ   Seizure (HCC)   Renal insufficiency   Factor VII deficiency (HCC)   Chronic systolic CHF (congestive heart failure), NYHA class 2 (HCC)   Chronic kidney disease (CKD), stage III (moderate) ([place])     1.  Anemia due to acute blood loss, symptomatic anemia: Currently no evidence of ongoing bleeding. Because of symptomatic hypotension, agree with transfusing 1 unit of PRBC.  Patient consented.  Recheck H&H after transfusion. Patient is not on any anticoagulation and he will stay off anticoagulation. He is also debilitated with all ongoing conditions, he will work with PT OT before discharge. Check orthostatic blood pressures to ensure stabilization.  2.  Chronic congestive  heart failure status  post AICD: Currently euvolemic.  Will give Lasix along with the blood transfusion.  Patient is well compensated at this time. Due to significant low blood pressure, will continue rate controlling medication including amiodarone and beta-blockers, however will stop Aldactone and nitrates as well as hydralazine at this time.  3.  Chronic atrial fibrillation: Rate controlled on amiodarone and Coreg.  Unable to anticoagulate.  4.  History of seizure disorder: Unlikely recurrent seizure.  He is on multiple medications.  Valproic acid level is low, however will not change his seizure regimen.  5.  Chronic kidney disease stage III due to hypertension: At about his baseline.  Monitor in the morning after transfusion.   DVT prophylaxis: SCDs Code Status: Full code Family Communication: Wife called, unable to pick of the phone Disposition Plan: Home after hospitalization Consults called: None Admission status: Observation telemetry   Barb Merino MD [facility] Pager [phone]  If 7PM-7AM, please contact night-coverage [email or web address] Password [facility]  07/20/2019, 5:03 PM

## 2019-07-20 NOTE — ED Triage Notes (Signed)
Pt c/o hypotension, and weakness, pt s/p oral surgery and 18 teeth removed last week. Pt states he has only been consuming minimal po liquids and has had "quite a bit of bleeding" per wife. EMS states pt's BS: 137. Pt states he continues to take meds. Wife: Lecil Tapp: [phone].

## 2019-07-21 DIAGNOSIS — E785 Hyperlipidemia, unspecified: Secondary | ICD-10-CM | POA: Diagnosis not present

## 2019-07-21 DIAGNOSIS — M6281 Muscle weakness (generalized): Secondary | ICD-10-CM | POA: Diagnosis not present

## 2019-07-21 DIAGNOSIS — I255 Ischemic cardiomyopathy: Secondary | ICD-10-CM | POA: Diagnosis not present

## 2019-07-21 DIAGNOSIS — I5020 Unspecified systolic (congestive) heart failure: Secondary | ICD-10-CM | POA: Diagnosis not present

## 2019-07-21 DIAGNOSIS — N400 Enlarged prostate without lower urinary tract symptoms: Secondary | ICD-10-CM | POA: Diagnosis present

## 2019-07-21 DIAGNOSIS — R609 Edema, unspecified: Secondary | ICD-10-CM | POA: Diagnosis not present

## 2019-07-21 DIAGNOSIS — Z86718 Personal history of other venous thrombosis and embolism: Secondary | ICD-10-CM | POA: Diagnosis not present

## 2019-07-21 DIAGNOSIS — K91840 Postprocedural hemorrhage and hematoma of a digestive system organ or structure following a digestive system procedure: Secondary | ICD-10-CM | POA: Diagnosis not present

## 2019-07-21 DIAGNOSIS — E162 Hypoglycemia, unspecified: Secondary | ICD-10-CM | POA: Diagnosis not present

## 2019-07-21 DIAGNOSIS — I509 Heart failure, unspecified: Secondary | ICD-10-CM | POA: Diagnosis not present

## 2019-07-21 DIAGNOSIS — D682 Hereditary deficiency of other clotting factors: Secondary | ICD-10-CM | POA: Diagnosis not present

## 2019-07-21 DIAGNOSIS — D6851 Activated protein C resistance: Secondary | ICD-10-CM | POA: Diagnosis not present

## 2019-07-21 DIAGNOSIS — M1A9XX0 Chronic gout, unspecified, without tophus (tophi): Secondary | ICD-10-CM | POA: Diagnosis not present

## 2019-07-21 DIAGNOSIS — I5022 Chronic systolic (congestive) heart failure: Secondary | ICD-10-CM | POA: Diagnosis not present

## 2019-07-21 DIAGNOSIS — Z8614 Personal history of Methicillin resistant Staphylococcus aureus infection: Secondary | ICD-10-CM | POA: Diagnosis not present

## 2019-07-21 DIAGNOSIS — G40909 Epilepsy, unspecified, not intractable, without status epilepticus: Secondary | ICD-10-CM | POA: Diagnosis not present

## 2019-07-21 DIAGNOSIS — Z7401 Bed confinement status: Secondary | ICD-10-CM | POA: Diagnosis not present

## 2019-07-21 DIAGNOSIS — R2689 Other abnormalities of gait and mobility: Secondary | ICD-10-CM | POA: Diagnosis not present

## 2019-07-21 DIAGNOSIS — D62 Acute posthemorrhagic anemia: Secondary | ICD-10-CM | POA: Diagnosis not present

## 2019-07-21 DIAGNOSIS — Z20828 Contact with and (suspected) exposure to other viral communicable diseases: Secondary | ICD-10-CM | POA: Diagnosis not present

## 2019-07-21 DIAGNOSIS — I517 Cardiomegaly: Secondary | ICD-10-CM | POA: Diagnosis not present

## 2019-07-21 DIAGNOSIS — Z8673 Personal history of transient ischemic attack (TIA), and cerebral infarction without residual deficits: Secondary | ICD-10-CM | POA: Diagnosis not present

## 2019-07-21 DIAGNOSIS — I639 Cerebral infarction, unspecified: Secondary | ICD-10-CM | POA: Diagnosis not present

## 2019-07-21 DIAGNOSIS — I10 Essential (primary) hypertension: Secondary | ICD-10-CM | POA: Diagnosis not present

## 2019-07-21 DIAGNOSIS — N183 Chronic kidney disease, stage 3 (moderate): Secondary | ICD-10-CM | POA: Diagnosis not present

## 2019-07-21 DIAGNOSIS — N390 Urinary tract infection, site not specified: Secondary | ICD-10-CM | POA: Diagnosis not present

## 2019-07-21 DIAGNOSIS — Z98818 Other dental procedure status: Secondary | ICD-10-CM | POA: Diagnosis not present

## 2019-07-21 DIAGNOSIS — I493 Ventricular premature depolarization: Secondary | ICD-10-CM | POA: Diagnosis present

## 2019-07-21 DIAGNOSIS — Z87891 Personal history of nicotine dependence: Secondary | ICD-10-CM | POA: Diagnosis not present

## 2019-07-21 DIAGNOSIS — K068 Other specified disorders of gingiva and edentulous alveolar ridge: Secondary | ICD-10-CM | POA: Diagnosis not present

## 2019-07-21 DIAGNOSIS — F0390 Unspecified dementia without behavioral disturbance: Secondary | ICD-10-CM | POA: Diagnosis present

## 2019-07-21 DIAGNOSIS — R5381 Other malaise: Secondary | ICD-10-CM | POA: Diagnosis not present

## 2019-07-21 DIAGNOSIS — E875 Hyperkalemia: Secondary | ICD-10-CM | POA: Diagnosis not present

## 2019-07-21 DIAGNOSIS — M1990 Unspecified osteoarthritis, unspecified site: Secondary | ICD-10-CM | POA: Diagnosis not present

## 2019-07-21 DIAGNOSIS — I2510 Atherosclerotic heart disease of native coronary artery without angina pectoris: Secondary | ICD-10-CM | POA: Diagnosis not present

## 2019-07-21 DIAGNOSIS — R0689 Other abnormalities of breathing: Secondary | ICD-10-CM | POA: Diagnosis not present

## 2019-07-21 DIAGNOSIS — R262 Difficulty in walking, not elsewhere classified: Secondary | ICD-10-CM | POA: Diagnosis not present

## 2019-07-21 DIAGNOSIS — K1379 Other lesions of oral mucosa: Secondary | ICD-10-CM | POA: Diagnosis not present

## 2019-07-21 DIAGNOSIS — M2550 Pain in unspecified joint: Secondary | ICD-10-CM | POA: Diagnosis not present

## 2019-07-21 DIAGNOSIS — I130 Hypertensive heart and chronic kidney disease with heart failure and stage 1 through stage 4 chronic kidney disease, or unspecified chronic kidney disease: Secondary | ICD-10-CM | POA: Diagnosis not present

## 2019-07-21 DIAGNOSIS — Z95810 Presence of automatic (implantable) cardiac defibrillator: Secondary | ICD-10-CM | POA: Diagnosis not present

## 2019-07-21 DIAGNOSIS — I472 Ventricular tachycardia: Secondary | ICD-10-CM | POA: Diagnosis not present

## 2019-07-21 DIAGNOSIS — I4891 Unspecified atrial fibrillation: Secondary | ICD-10-CM | POA: Diagnosis not present

## 2019-07-21 DIAGNOSIS — S01502A Unspecified open wound of oral cavity, initial encounter: Secondary | ICD-10-CM | POA: Diagnosis not present

## 2019-07-21 DIAGNOSIS — I480 Paroxysmal atrial fibrillation: Secondary | ICD-10-CM | POA: Diagnosis not present

## 2019-07-21 DIAGNOSIS — D689 Coagulation defect, unspecified: Secondary | ICD-10-CM | POA: Diagnosis not present

## 2019-07-21 DIAGNOSIS — D649 Anemia, unspecified: Secondary | ICD-10-CM | POA: Diagnosis present

## 2019-07-21 DIAGNOSIS — Z951 Presence of aortocoronary bypass graft: Secondary | ICD-10-CM | POA: Diagnosis not present

## 2019-07-21 DIAGNOSIS — I9581 Postprocedural hypotension: Secondary | ICD-10-CM | POA: Diagnosis present

## 2019-07-21 DIAGNOSIS — G4700 Insomnia, unspecified: Secondary | ICD-10-CM | POA: Diagnosis not present

## 2019-07-21 LAB — CBC
HCT: 29.4 % — ABNORMAL LOW (ref 39.0–52.0)
Hemoglobin: 9.3 g/dL — ABNORMAL LOW (ref 13.0–17.0)
MCH: 30.1 pg (ref 26.0–34.0)
MCHC: 31.6 g/dL (ref 30.0–36.0)
MCV: 95.1 fL (ref 80.0–100.0)
Platelets: 125 10*3/uL — ABNORMAL LOW (ref 150–400)
RBC: 3.09 MIL/uL — ABNORMAL LOW (ref 4.22–5.81)
RDW: 14.1 % (ref 11.5–15.5)
WBC: 5.7 10*3/uL (ref 4.0–10.5)
nRBC: 0.5 % — ABNORMAL HIGH (ref 0.0–0.2)

## 2019-07-21 LAB — BASIC METABOLIC PANEL
Anion gap: 7 (ref 5–15)
BUN: 55 mg/dL — ABNORMAL HIGH (ref 8–23)
CO2: 24 mmol/L (ref 22–32)
Calcium: 8.3 mg/dL — ABNORMAL LOW (ref 8.9–10.3)
Chloride: 102 mmol/L (ref 98–111)
Creatinine, Ser: 1.51 mg/dL — ABNORMAL HIGH (ref 0.61–1.24)
GFR calc Af Amer: 53 mL/min — ABNORMAL LOW (ref >60)
GFR calc non Af Amer: 46 mL/min — ABNORMAL LOW (ref >60)
Glucose, Bld: 96 mg/dL (ref 70–99)
Potassium: 4.3 mmol/L (ref 3.5–5.1)
Sodium: 133 mmol/L — ABNORMAL LOW (ref 135–145)

## 2019-07-21 LAB — ABO/RH: ABO/RH(D): O POS

## 2019-07-21 LAB — BRAIN NATRIURETIC PEPTIDE: B Natriuretic Peptide: 40.2 pg/mL (ref 0.0–100.0)

## 2019-07-21 MED ORDER — SODIUM CHLORIDE 0.9 % IV SOLN
Freq: Once | INTRAVENOUS | Status: AC
  Administered 2019-07-21: 10:00:00 via INTRAVENOUS

## 2019-07-21 MED ORDER — AMOXICILLIN 500 MG PO CAPS
500.0000 mg | ORAL_CAPSULE | Freq: Three times a day (TID) | ORAL | Status: AC
  Administered 2019-07-21 – 2019-07-24 (×12): 500 mg via ORAL
  Filled 2019-07-21 (×12): qty 1

## 2019-07-21 MED ORDER — ENSURE ENLIVE PO LIQD
237.0000 mL | Freq: Three times a day (TID) | ORAL | Status: DC
  Administered 2019-07-21 – 2019-08-17 (×43): 237 mL via ORAL

## 2019-07-21 MED ORDER — AMINOCAPROIC ACID SOLUTION 5% (50 MG/ML)
10.0000 mL | ORAL | Status: AC
  Administered 2019-07-21 (×10): 10 mL via ORAL
  Filled 2019-07-21: qty 100

## 2019-07-21 NOTE — Progress Notes (Signed)
RN called dentist office at [phone] as MD ordered for dental consult. No on answered; left a message with phone number and name. Waiting for a phone call back.

## 2019-07-21 NOTE — Progress Notes (Addendum)
Initial Nutrition Assessment  DOCUMENTATION CODES:   Not applicable  INTERVENTION:  - will order Ensure Enlive TID, each supplement provides 350 kcal and 20 grams of protein. - continue to encourage PO intakes.    NUTRITION DIAGNOSIS:   Inadequate oral intake related to acute illness, mouth pain as evidenced by per patient/family report.  GOAL:   Patient will meet greater than or equal to 90% of their needs  MONITOR:   PO intake, Supplement acceptance, Diet advancement, Labs, Weight trends  REASON FOR ASSESSMENT:   Malnutrition Screening Tool  ASSESSMENT:   74 y.o. 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 He presented to the ED d/t lightheadedness and dizziness, bleeding from the mouth, and poor oral intakes. Patient denied being on blood thinners since retroperitoneal bleed ~2 months ago. He had 18 teeth removed by his dentist on 07/08/19 and following that time continued to have oral bleeding.  No intakes documented since admission. Lunch tray on bedside table and untouched. Patient reports eating jello earlier but that bothered his heart so he stopped eating it. He states no issues with eating, no oral or dental pain prior to teeth removal. Since removal he has mainly consumed water, gatorade, soup, and vanilla Boost supplements. He has not tried any soft foods. He is very interested in receiving protein supplements during this admission. Asked him about ice cream and he states that he does not often eat it; thought it may feel good on his gums.   Patient denies any abdominal pain or nausea today or in the time PTA. He has felt hungry but intakes are limited by missing teeth, sore gums, and ongoing significant bleeding.   Per chart review, current weight is 200 lb. He is unsure of UBW  or if he has lost any weight since teeth removal on 7/15. Weight on 4/28 was 203 lb; 3 lb weight loss not significant for 3 month time frame. Weight on 2/6 was 217 lb which indicates 17 lb weight loss (8% body weight) in the past 5.5 months; not significant for time frame.   Patient reported being cold and did not want RD to remove blankets to assess legs at this time.   Labs reviewed; Na: 133 mmol/l, BUN: 55 mg/dl, creatinine: 1.51 mg/dl, Ca: 8.3 mg/dl, GFR: 53 ml/min.  Medications reviewed; 40 mg oral lasix/day.      NUTRITION - FOCUSED PHYSICAL EXAM:    Most Recent Value  Orbital Region  No depletion  Upper Arm Region  Mild depletion  Thoracic and Lumbar Region  Unable to assess  Buccal Region  No depletion  Temple Region  No depletion  Clavicle Bone Region  Moderate depletion  Clavicle and Acromion Bone Region  Moderate depletion  Scapular Bone Region  Unable to assess  Dorsal Hand  No depletion  Patellar Region  Unable to assess  Anterior Thigh Region  Unable to assess  Posterior Calf Region  Unable to assess  Edema (RD Assessment)  Unable to assess  Hair  Reviewed  Eyes  Reviewed  Mouth  Reviewed  Skin  Reviewed  Nails  Reviewed       Diet Order:   Diet Order            Diet full liquid Room service appropriate? Yes; Fluid consistency: Thin  Diet effective now  EDUCATION NEEDS:   No education needs have been identified at this time  Skin:  Skin Assessment: Reviewed RN Assessment  Last BM:  7/25  Height:   Ht Readings from Last 1 Encounters:  07/20/19 5' 10.98" (1.803 m)    Weight:   Wt Readings from Last 1 Encounters:  07/21/19 90.6 kg    Ideal Body Weight:  78.2 kg  BMI:  Body mass index is 27.87 kg/m.  Estimated Nutritional Needs:   Kcal:  1820-2085 kcal  Protein:  80-90 grams  Fluid:  >/= 2 L/day     Jarome Matin, MS, RD, LDN, [facility] Inpatient Clinical Dietitian Pager # [phone] After hours/weekend pager #  [phone]

## 2019-07-21 NOTE — Progress Notes (Signed)
I got a call from Dr. Enrique Sack regarding his outpt follow up.   I have reviewed his chart, he was seen by Dr. Beryle Beams in 2013 but did not followup. He had mild factor VII deficiency, with level in 30-60% range. He may need factor VII replacement before invasive surgery or procedure in the future. Since his bleeding has stopped now, no addition VIII need for now.   I have spoken with my colleague Dr. Irene Limbo and he will see him in our clinic. Our scheduler will call pt or his wife.  I called pt and let him know the above, he deferred to his wife. I called his wife and left a VM.   Truitt Merle  07/21/2019

## 2019-07-21 NOTE — Progress Notes (Signed)
PT Cancellation Note  Patient Details Name: [facility]. MRN: [id number] DOB: 12-Aug-1946   Cancelled Treatment:    Reason Eval/Treat Not Completed: Patient declined.  He cited his knee pain and the fact that he got up with OT earlier.  (Noted to be hypotensive with her.)  Will check back as schedule permits.   Galen Manila 07/21/2019, 1:42 PM

## 2019-07-21 NOTE — Progress Notes (Signed)
RN called patient's wife at 53 to update about plan of care. She has no question at this time.

## 2019-07-21 NOTE — Consult Note (Signed)
DENTAL CONSULTATION  Date of Consultation:  07/21/2019 Patient Name:   [facility]. Date of Birth:   03/08/1946 Medical Record Number: [id number]  COVID 19 SCREENING: The patient does not symptoms concerning for COVID-19 infection (Including fever, chills, cough, or new SHORTNESS OF BREATH).    VITALS: BP 94/64 (BP Location: Right Arm)   Pulse (!) 122   Temp 98.1 F (36.7 C) (Oral)   Resp 16   Ht 5' 10.98" (1.803 m)   Wt 90.6 kg   SpO2 100%   BMI 27.87 kg/m   CHIEF COMPLAINT: Patient referred by Dr. Cherylann Ratel for dental consultation.  HPI: Keiondre Colee. 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  Patient presented to the emergency department yesterday and was found to have significant anemia secondary to the blood loss from extraction of 18 teeth on 07/16/2019.  The extractions were performed at [facility] by Dr. Perlie Gold.  The patient indicated that he had persistent postoperative bleeding off and on since the dental extractions last Thursday.  The patient subsequently developed dizziness symptoms along with hypotension and presented to the ED for evaluation and treatment as indicated.  The patient was treated with IV antibiotic therapy, IV fluids, and RBC transfusion.  Dental consultation has not been requested to evaluate for history of bleeding for possible dental input.  Per my chart review, the Edoxaban has not been given since the bleeding episode in April of 2020.  Per discussion with Dr. Clinton Sawyer office the extraction sockets were packed with Gelfoam after the dental extractions.    Patient currently has some discomfort from the dental extraction sites.  Patient denies any significant bleeding at this time.  Patient indicates  that there are clots present in his mouth.  The patient was initially given an upper complete immediate denture to wear after the dental extractions.  The patient was instructed not to wear the lower immediate denture at that time.  Patient has since stopped wearing the upper immediate complete denture and both dentures are at home per discussion with the wife.  PROBLEM LIST: Patient Active Problem List   Diagnosis Date Noted  . Anemia due to blood loss, acute 07/20/2019  . Retroperitoneal bleed 04/21/2019  . Retroperitoneal hematoma   . Right arm pain 11/26/2018  . Hematoma 03/26/2018  . Leg pain 03/04/2018  . 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 Elevated TSH 10/27/2017  . Weakness of both lower extremities 10/27/2017  . Unsteady gait 10/27/2017  . Localization-related symptomatic epilepsy and epileptic syndromes with complex partial seizures, not intractable, without status epilepticus ([place]) 10/08/2017  . Insomnia 09/02/2017  . Degenerative joint disease of knee, left 08/01/2017  . Blood loss anemia 04/18/2017  . Hx of adenomatous colonic polyps 02/20/2017  . Chronic pain of left knee 01/22/2017  . Chronic kidney disease (CKD), stage III (moderate) ([place]) 11/08/2016  . Hypertensive heart disease without heart failure   . Chronic chest wall pain 08/17/2016  . Periodontal disease 05/18/2016  . HLD (hyperlipidemia) 02/26/2016  . CVA (cerebral infarction) 02/26/2016  . Near syncope 02/26/2016  . Gout 12/30/2015  . Persistent atrial fibrillation with rapid ventricular response 04/21/2014  . Chronic systolic CHF (congestive heart failure), NYHA class 2 ([place]) 11/06/2012  . Factor VII deficiency ([place]) 10/07/2012  . Seizure (  [place]) 10/01/2012  . Renal insufficiency 10/01/2012  . Ischemic cardiomyopathy  ? additional rate component 09/29/2012  . Automatic implantable cardioverter-defibrillator in situ   . HTN (hypertension) 09/21/2012  . Coronary artery disease involving native heart  09/21/2012    PMH: Past Medical History:  Diagnosis Date  . AICD (automatic cardioverter/defibrillator) present   . Atrial fibrillation   09/22/2012  . Blood loss anemia 04/18/2017   After GI bleed from colonoscopy and polypectomy  . CAD (coronary artery disease)   . Chronic systolic heart failure ([place])   . Factor VII deficiency ([place]) 05/2011  . Factor VII deficiency ([place]) 10/07/2012  . GIB (gastrointestinal bleeding) 02/20/2017  . History of MRSA infection 05/2011  . HTN (hypertension)   . Hx of adenomatous colonic polyps 02/20/2017   01/2017 - 3 cm sigmoid TV adenoma and other smaller polyps - had post-polypectomy bleed Tx w/ clips Consider repeat colonoscopy 3 yrs [name], MD, [name]   . Hyperlipidemia   . implantable cardiac defibrillator-Biotronik    Device Implanted 2006; s/p gen change 03/2011 : bleeding persistent with pocket erosion and infection; explant and reimplant  06/2011  . Ischemic cardiomyopathy    EF 15 to 20% by TTE and TEE in 09/2012.  Severe LV dysfunction  . Obesity    BMI 31 in 09/2012  . Persistent atrial fibrillation with rapid ventricular response 04/21/2014  . Retroperitoneal bleed 04/21/2019  . Seizure disorder ([place]) latest 09/30/2012  . Seizures ([place])   . Stroke [facility])     PSH: Past Surgical History:  Procedure Laterality Date  . CARDIAC CATHETERIZATION N/A 10/12/2015   Procedure: Right Heart Cath;  Surgeon: [name], MD;  Location: [facility] CV LAB;  Service: Cardiovascular;  Laterality: N/A;  . CARDIOVERSION  09/24/2012   Procedure: CARDIOVERSION;  Surgeon: [name], MD;  Location: [facility] ENDOSCOPY;  Service: Cardiovascular;  Laterality: N/A;  . CARDIOVERSION N/A 12/25/2017   Procedure: CARDIOVERSION;  Surgeon: [name], MD;  Location: [facility] ORS;  Service: Cardiovascular;  Laterality: N/A;  . COLONOSCOPY W/ POLYPECTOMY  02/13/2017  . CORONARY ARTERY BYPASS GRAFT  2011   in [place]  . FLEXIBLE SIGMOIDOSCOPY N/A 02/20/2017    Procedure: FLEXIBLE SIGMOIDOSCOPY;  Surgeon: [name], MD;  Location: [facility] ENDOSCOPY;  Service: Endoscopy;  Laterality: N/A;  . FLEXIBLE SIGMOIDOSCOPY N/A 02/21/2017   Procedure: FLEXIBLE SIGMOIDOSCOPY;  Surgeon: [name], MD;  Location: [facility] ENDOSCOPY;  Service: Endoscopy;  Laterality: N/A;  . ICD    . TEE WITHOUT CARDIOVERSION  09/24/2012   Procedure: TRANSESOPHAGEAL ECHOCARDIOGRAM (TEE);  Surgeon: [name], MD;  Location: [facility] ENDOSCOPY;  Service: Cardiovascular;  Laterality: N/A;  dave/anesth, dl, cindy/echo     ALLERGIES: No Known Allergies  MEDICATIONS: Current Facility-Administered Medications  Medication Dose Route Frequency Provider Last Rate Last Dose  . acetaminophen (TYLENOL) tablet 650 mg  650 mg Oral Q6H PRN Barb Merino, MD      . acetaminophen-codeine (TYLENOL #3) 300-30 MG per tablet 1 tablet  1 tablet Oral Q6H PRN Barb Merino, MD      . aminocaproic acid (AMICAR) oral solution 50 mg/mL (5%), 100 ml  10 mL Oral Q1H Teena Dunk F, [name]   10 mL at 07/21/19 1356  . amiodarone (PACERONE) tablet 200 mg  200 mg Oral Daily Barb Merino, MD   200 mg at 07/21/19 1009  . amoxicillin (AMOXIL) capsule 500 mg  500 mg Oral Q8H Kyle, Tyrone A, DO   500 mg  at 07/21/19 1318  . carvedilol (COREG) tablet 12.5 mg  12.5 mg Oral BID WC Barb Merino, MD   12.5 mg at 07/21/19 0753  . divalproex (DEPAKOTE ER) 24 hr tablet 500 mg  500 mg Oral Daily Barb Merino, MD   500 mg at 07/21/19 1008  . DULoxetine (CYMBALTA) DR capsule 30 mg  30 mg Oral Daily Barb Merino, MD   30 mg at 07/21/19 1008  . feeding supplement (ENSURE ENLIVE) (ENSURE ENLIVE) liquid 237 mL  237 mL Oral TID BM Kyle, Tyrone A, DO   237 mL at 07/21/19 1318  . furosemide (LASIX) tablet 40 mg  40 mg Oral Daily Barb Merino, MD   40 mg at 07/21/19 1011  . levETIRAcetam (KEPPRA) tablet 1,000 mg  1,000 mg Oral BID Barb Merino, MD   1,000 mg at 07/21/19 1008  . ondansetron (ZOFRAN) tablet 4 mg  4 mg Oral Q6H PRN  Barb Merino, MD       Or  . ondansetron (ZOFRAN) injection 4 mg  4 mg Intravenous Q6H PRN Barb Merino, MD      . phenytoin (DILANTIN) ER capsule 100 mg  100 mg Oral BID Barb Merino, MD   100 mg at 07/21/19 1008  . polyethylene glycol (MIRALAX / GLYCOLAX) packet 17 g  17 g Oral Daily PRN Barb Merino, MD      . simvastatin (ZOCOR) tablet 20 mg  20 mg Oral q1800 Barb Merino, MD      . tamsulosin (FLOMAX) capsule 0.4 mg  0.4 mg Oral Daily Barb Merino, MD   0.4 mg at 07/21/19 1008  . traMADol (ULTRAM) tablet 50 mg  50 mg Oral Q6H PRN Barb Merino, MD        LABS: Lab Results  Component Value Date   WBC 5.7 07/21/2019   HGB 9.3 (L) 07/21/2019   HCT 29.4 (L) 07/21/2019   MCV 95.1 07/21/2019   PLT 125 (L) 07/21/2019      Component Value Date/Time   NA 133 (L) 07/21/2019 0329   K 4.3 07/21/2019 0329   CL 102 07/21/2019 0329   CO2 24 07/21/2019 0329   GLUCOSE 96 07/21/2019 0329   BUN 55 (H) 07/21/2019 0329   CREATININE 1.51 (H) 07/21/2019 0329   CALCIUM 8.3 (L) 07/21/2019 0329   GFRNONAA 46 (L) 07/21/2019 0329   GFRAA 53 (L) 07/21/2019 0329   Lab Results  Component Value Date   INR 1.51 03/14/2017   INR 1.4 03/11/2017   INR 1.4 03/05/2017   PROTIME 13.3 (H) 10/03/2012   Lab Results  Component Value Date   PTT 30 (H) 10/03/2012    SOCIAL HISTORY: Social History   Socioeconomic History  . Marital status: Married    Spouse name: Not on file  . Number of children: Not on file  . Years of education: Not on file  . Highest education level: Not on file  Occupational History  . Not on file  Social Needs  . Financial resource strain: Not on file  . Food insecurity    Worry: Not on file    Inability: Not on file  . Transportation needs    Medical: Not on file    Non-medical: Not on file  Tobacco Use  . Smoking status: Former Smoker    Types: Pipe    Quit date: 09/21/2008    Years since quitting: 10.8  . Smokeless tobacco: Former Systems developer    Quit date:  09/21/2008  Substance and Sexual Activity  .  Alcohol use: No  . Drug use: No  . Sexual activity: Not Currently    Birth control/protection: None  Lifestyle  . Physical activity    Days per week: Not on file    Minutes per session: Not on file  . Stress: Not on file  Relationships  . Social Herbalist on phone: Not on file    Gets together: Not on file    Attends religious service: Not on file    Active member of club or organization: Not on file    Attends meetings of clubs or organizations: Not on file    Relationship status: Not on file  . Intimate partner violence    Fear of current or ex partner: Not on file    Emotionally abused: Not on file    Physically abused: Not on file    Forced sexual activity: Not on file  Other Topics Concern  . Not on file  Social History Narrative   ** Merged History Encounter **        FAMILY HISTORY: Family History  Problem Relation Age of Onset  . Arthritis Mother   . Heart disease Mother   . Heart attack Mother   . Other Father        smoker  . Hypertension Neg Hx        unknown  . Stroke Neg Hx        unknown    REVIEW OF SYSTEMS: Reviewed with the patient as per History of present illness. Psych: Patient denies having dental phobia.  DENTAL HISTORY: HPI: Sahil Milner. 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  Patient presented to the emergency department yesterday and was found to have significant anemia secondary to the blood loss from extraction of 18 teeth on 07/16/2019.  The extractions were performed at [facility] by Dr. Perlie Gold.  The patient indicated that he had persistent postoperative bleeding off and on since the dental extractions last Thursday.  The patient  subsequently developed dizziness symptoms along with hypotension and presented to the ED for evaluation and treatment as indicated.  The patient was treated with IV antibiotic therapy, IV fluids, and RBC transfusion.  Dental consultation has not been requested to evaluate for history of bleeding for possible dental input.  Per my chart review, the Edoxaban has not been given since the bleeding episode in April of 2020.  Per discussion with Dr. Clinton Sawyer office the extraction sockets were packed with Gelfoam after the dental extractions.    Patient currently has some discomfort from the dental extraction sites.  Patient denies any significant bleeding at this time.  Patient indicates that there are clots present in his mouth.  The patient was initially given an upper complete immediate denture to wear after the dental extractions.  The patient was instructed not to wear the lower immediate denture at that time.  Patient has since stopped wearing the upper immediate complete denture and both dentures are at home per discussion with the wife.  DENTAL EXAMINATION: GENERAL: The patient is a well-developed well-nourished male in no acute distress. HEAD AND NECK: I do not palpate any lymphadenopathy.  The patient denies acute TMJ symptoms. INTRAORAL EXAM: The patient is now completely edentulous.  There are multiple areas with  clots present from the dental extraction sites.  No oozing is present at this time.  Some of the larger clots were removed at this time with a 4 x 4 without inducing additional oozing.  Intraoral bruising is noted. DENTITION: The patient is edentulous. PROSTHODONTIC: The patient was given upper and lower immediate complete dentures at the time of the dental extractions.  The patient is no longer wearing the immediate complete dentures and these are at home per his wife. OCCLUSION: Unable to assess at this time.   ASSESSMENTS: 1.  Anemia secondary to blood loss 2.  History of factor  VII deficiency 3.  History of postoperative bleeding from extraction of 18 teeth on 07/16/2019 4.  History of transfusion this admission with red blood cells.   5.  Current oral antibiotic therapy with amoxicillin 500 mg every 8 hours  6.  The patient is completely edentulous   PLAN/RECOMMENDATIONS: 1. I discussed the risks, benefits, and complications of various treatment options with the patient in relationship to his postoperative bleeding from dental extraction sites.  The patient currently has multiple clots present in his mouth with no oozing noted.  I will order Amicar 5% rinses.  Patient is to rinse with 10 mL's every hour for the next 10 hours in a swish and spit manner and then continue to use rinse as needed for persistent oozing from dental extraction sites.   The patient is to also use a series of three 4 x 4 gauzes folded over and applied to the bleeding areas within the mouth with pressure to aid in hemostasis as needed.  I have also talked with Dr. Burr Medico in medical oncology concerning hematology consultation and additional recommendations as needed.  Dr. Burr Medico is aware of the patient's factor VII deficiency and notes that patient was previously referred to [facility] for evaluation and discussion of treatment options.  Patient apparently did not follow-up with this recommendation.  Dr. Burr Medico is to assist in arranging follow-up at [facility] as an outpatient if the patient so desires.  Patient is apparently planned for discharge tomorrow if no other significant bleeding occurs in the interim.  Patient was instructed to follow-up with his primary dentist for evaluation of healing and recommendations current concerning use of his immediate dentures.   2. Discussion of findings with medical team and coordination of future medical and dental care as needed.    Lenn Cal, [name]

## 2019-07-21 NOTE — Progress Notes (Signed)
Patient HR= 120 -125. Paged MD at [phone]. Waiting for new order.  Patient is alert and confuse about place. He was witnessed to lay down on floor with a pillow and stated that he felt hot and just wanted to lay down on his couch. RN assessed patient. No injury, no fall. Patient is alert and oriented and said apology for doing that. Bed alarm was on.

## 2019-07-21 NOTE — Evaluation (Signed)
Occupational Therapy Evaluation Patient Details Name: Louis Ford. MRN: [id number] DOB: November 06, 1946 Today's Date: 07/21/2019    History of Present Illness Louis Ford. is a 73 y.o. 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   Clinical Impression   This 73 yo male admitted with above presents to acute OT with decreased endurance for activity with pt reporting feeling SOB (non audible) but sats fine on RA. He did however have orthostatic HTN (sitting 109/88 HR 117, standing--but had to sit before BP finished 77/56 JR 117; end of session in supine 95/76 HR 115). Due to this pt has decreased mobility and decreased balance affecting his safety and ability to perform basic ADLs. He will continue to benefit from acute OT with follow up OT at SNF to work back towards his Mod I to independent level.    Follow Up Recommendations  SNF;Supervision/Assistance - 24 hour    Equipment Recommendations  None recommended by OT       Precautions / Restrictions Precautions Precautions: Fall Precaution Comments: monitor orthostatics Restrictions Weight Bearing Restrictions: No      Mobility Bed Mobility Overal bed mobility: Needs Assistance Bed Mobility: Supine to Sit;Sit to Supine     Supine to sit: Min guard Sit to supine: Min guard      Transfers Overall transfer level: Needs assistance Equipment used: Rolling walker (2 wheeled) Transfers: Sit to/from Stand Sit to Stand: Min assist              Balance Overall balance assessment: Needs assistance Sitting-balance support: No  upper extremity supported;Feet supported Sitting balance-Leahy Scale: Fair     Standing balance support: Bilateral upper extremity supported Standing balance-Leahy Scale: Poor                             ADL either performed or assessed with clinical judgement   ADL Overall ADL's : Needs assistance/impaired Eating/Feeding: Independent;Sitting   Grooming: Set up;Supervision/safety;Sitting   Upper Body Bathing: Supervision/ safety;Set up;Sitting   Lower Body Bathing: Moderate assistance Lower Body Bathing Details (indicate cue type and reason): min A sit<>stand Upper Body Dressing : Supervision/safety;Set up;Sitting   Lower Body Dressing: Maximal assistance Lower Body Dressing Details (indicate cue type and reason): min A sit<>stand   Toilet Transfer Details (indicate cue type and reason): unable to attempt due to drop in BP                 Vision Patient Visual Report: No change from baseline              Pertinent Vitals/Pain Pain Assessment: Faces Faces Pain Scale: Hurts little more Pain Location: mouth Pain Descriptors / Indicators: Sore Pain Intervention(s): Monitored during session     Hand Dominance Right   Extremity/Trunk Assessment Upper Extremity Assessment Upper Extremity Assessment: Overall WFL for tasks assessed           Communication Communication Communication: No difficulties   Cognition Arousal/Alertness: Awake/alert Behavior During Therapy: WFL for tasks assessed/performed;Anxious Overall Cognitive Status: Within Functional Limits for tasks assessed  Home Living Family/patient expects to be discharged to:: Skilled nursing facility Living Arrangements: Spouse/significant other Available Help at Discharge: Family Type of Home: Apartment Home Access: Level entry     Home Layout: One level     Bathroom Shower/Tub: Teacher, early years/pre:  Standard     Home Equipment: Cane - single Ford;Walker - 2 wheels;Toilet riser;Shower seat          Prior Functioning/Environment Level of Independence: Independent with assistive device(s)                 OT Problem List: Decreased activity tolerance;Impaired balance (sitting and/or standing);Pain      OT Treatment/Interventions: Self-care/ADL training;DME and/or AE instruction;Patient/family education;Balance training    OT Goals(Current goals can be found in the care plan section) Acute Rehab OT Goals Patient Stated Goal: to feel better and go home OT Goal Formulation: With patient Time For Goal Achievement: 08/04/19 Potential to Achieve Goals: Good  OT Frequency: Min 2X/week              AM-PAC OT "6 Clicks" Daily Activity     Outcome Measure Help from another person eating meals?: None Help from another person taking care of personal grooming?: A Little Help from another person toileting, which includes using toliet, bedpan, or urinal?: A Lot Help from another person bathing (including washing, rinsing, drying)?: A Lot Help from another person to put on and taking off regular upper body clothing?: A Little Help from another person to put on and taking off regular lower body clothing?: A Lot 6 Click Score: 16   End of Session Equipment Utilized During Treatment: Gait belt;Rolling walker Nurse Communication: (Drop in BP)  Activity Tolerance: (limited by drop in BP) Patient left: in bed;with call bell/phone within reach;with bed alarm set  OT Visit Diagnosis: Unsteadiness on feet (R26.81);Other abnormalities of gait and mobility (R26.89);Muscle weakness (generalized) (M62.81);History of falling (Z91.81)                Time: 7425-9563 OT Time Calculation (min): 22 min Charges:  OT General Charges $OT Visit: 1 Visit OT Evaluation $OT Eval Moderate Complexity: 1 Mod  Golden Circle, OTR/L Acute [employer] Pager [phone] Office  [phone]     Almon Register 07/21/2019, 11:09 AM

## 2019-07-21 NOTE — Progress Notes (Signed)
[name]  PROGRESS NOTE    [facility].  TIR:[id number] DOB: 30-Dec-1945 DOA: 07/20/2019 PCP: [name], MD   Brief Narrative:   Louis Ford. is a 73 y.o. 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  Patient is poor historian.  Wife could not pick up the phone.  I reviewed his previous admissions.  According to the patient, he has not been on blood thinners since he had that retroperitoneal bleed about 2 months ago.  He had been otherwise doing well.  He walks with a walker.  Also has a history of seizure and is on multiple medications.  He went to dentist and had almost all of his teeth removed on 07/08/2019, following that he had continued bleeding into his mouth and they had talked to their dentist who recommended to control with a gauze piece.  His wife has been trying to feed him with Gatorade and chicken soup but otherwise he has not been able to eat much.  Patient states that 2 days ago he fell and hit his head had some pain but improved now. The reason he came to the hospital today is feeling dizzy and blood pressure dropping on ambulation.   Assessment & Plan:   Principal Problem:   Anemia due to blood loss, acute Active Problems:   HTN (hypertension)   Coronary artery disease involving native heart   Automatic implantable cardioverter-defibrillator in situ   Seizure (HCC)   Renal insufficiency   Factor VII deficiency (HCC)   Chronic systolic CHF (congestive heart failure), NYHA class 2 (HCC)   Chronic kidney disease (CKD), stage III (moderate) (HCC)   Anemia due to acute blood  loss, symptomatic anemia:     - s/p 1 unit pRBCs d/t symptomatic anemia; appropriate response (Hgb 8.3 - 9.3)     - oral bleeding from recent dental procedure (dental team consulted)     - Patient is not on any anticoagulation and he will stay off anticoagulation.  Dizziness/lightheadedness     - multifactorial; in part d/t above, hypotension also playing a role; also question if aFib playing a role     - small bolus fluid     - repeat EKG, may need cards consult  Chronic congestive heart failure status post AICD:      - given lasix along with his transfusion blood transfusion.       - d/t hypotension, continued rate controlling medication including amiodarone and beta-blockers, but stopped Aldactone and nitrates as well as hydralazine     - he actually seems a little dry and wife confirms poor PO intake over last several days as well as recent darkening of urine     - giving a little fluids (500cc slowly); check a BNP  Chronic atrial fibrillation     - Rate controlled on amiodarone and Coreg.  Unable to anticoagulate.  History of seizure disorder:      - Unlikely recurrent seizure.       - He is on multiple medications.       - Valproic acid level is low, however will not change his seizure regimen.  Chronic kidney disease stage III due to hypertension:      -  At about his baseline; monitor  Still symptomatic. Getting fluids. Awaiting dental consult for oral bleeding. Check BNP. Probably needs another night.   DVT prophylaxis: SCDs Code Status: FULL Family Communication: Spoke with wife by phone 1100hrs   Disposition Plan: TBD   Consultants:   Dental   Subjective: "I still feel dizzy."  Objective: Vitals:   07/20/19 2209 07/21/19 0029 07/21/19 0628 07/21/19 1000  BP: 101/69 91/74 94/64  94/64  Pulse: 98 (!) 102 (!) 122 (!) 122  Resp: 18 18 16    Temp: 98 F (36.7 C) 98 F (36.7 C) 98.1 F (36.7 C)   TempSrc: Oral Oral Oral   SpO2: 100% 100% 100%   Weight:    90.6 kg   Height:        Intake/Output Summary (Last 24 hours) at 07/21/2019 1103 Last data filed at 07/21/2019 0356 Gross per 24 hour  Intake 316.17 ml  Output 300 ml  Net 16.17 ml   Filed Weights   07/20/19 1844 07/21/19 0628  Weight: 92.5 kg 90.6 kg    Examination:  General: 74 y.o. male resting in bed in NAD Cardiovascular: irregular, +S1, S2, 1/6 SEM, No g/r, equal pulses throughout Respiratory: CTABL, no w/r/r, normal WOB GI: BS+, NDNT, no masses noted, no organomegaly noted MSK: No e/c/c Neuro: A&O x 3, no focal deficits   Data Reviewed: I have personally reviewed following labs and imaging studies.  CBC: Recent Labs  Lab 07/20/19 1310 07/21/19 0329  WBC 5.6 5.7  NEUTROABS 3.9  --   HGB 8.3* 9.3*  HCT 25.9* 29.4*  MCV 94.9 95.1  PLT 126* 272*   Basic Metabolic Panel: Recent Labs  Lab 07/20/19 1310 07/21/19 0329  NA 133* 133*  K 4.3 4.3  CL 100 102  CO2 25 24  GLUCOSE 114* 96  BUN 60* 55*  CREATININE 1.62* 1.51*  CALCIUM 8.1* 8.3*   GFR: Estimated Creatinine Clearance: 52.4 mL/min (A) (by C-G formula based on SCr of 1.51 mg/dL (H)). Liver Function Tests: Recent Labs  Lab 07/20/19 1310  AST 17  ALT 17  ALKPHOS 65  BILITOT 0.5  PROT 7.2  ALBUMIN 3.0*   No results for input(s): LIPASE, AMYLASE in the last 168 hours. No results for input(s): AMMONIA in the last 168 hours. Coagulation Profile: No results for input(s): INR, PROTIME in the last 168 hours. Cardiac Enzymes: No results for input(s): CKTOTAL, CKMB, CKMBINDEX, TROPONINI in the last 168 hours. BNP (last 3 results) Recent Labs    11/26/18 1025  PROBNP 56.0   HbA1C: No results for input(s): HGBA1C in the last 72 hours. CBG: No results for input(s): GLUCAP in the last 168 hours. Lipid Profile: No results for input(s): CHOL, HDL, LDLCALC, TRIG, CHOLHDL, LDLDIRECT in the last 72 hours. Thyroid Function Tests: No results for input(s): TSH, T4TOTAL, FREET4, T3FREE, THYROIDAB in  the last 72 hours. Anemia Panel: No results for input(s): VITAMINB12, FOLATE, FERRITIN, TIBC, IRON, RETICCTPCT in the last 72 hours. Sepsis Labs: No results for input(s): PROCALCITON, LATICACIDVEN in the last 168 hours.  Recent Results (from the past 240 hour(s))  SARS Coronavirus 2 (CEPHEID - Performed in [place] hospital lab), Hosp Order     Status: None   Collection Time: 07/20/19  1:58 PM   Specimen: Nasopharyngeal Swab  Result Value Ref Range Status   SARS Coronavirus 2 NEGATIVE NEGATIVE Final    Comment: (NOTE) If result is NEGATIVE SARS-CoV-2 target nucleic acids are NOT DETECTED. The SARS-CoV-2 RNA is generally  detectable in upper and lower  respiratory specimens during the acute phase of infection. The lowest  concentration of SARS-CoV-2 viral copies this assay can detect is 250  copies / mL. A negative result does not preclude SARS-CoV-2 infection  and should not be used as the sole basis for treatment or other  patient management decisions.  A negative result may occur with  improper specimen collection / handling, submission of specimen other  than nasopharyngeal swab, presence of viral mutation(s) within the  areas targeted by this assay, and inadequate number of viral copies  (<250 copies / mL). A negative result must be combined with clinical  observations, patient history, and epidemiological information. If result is POSITIVE SARS-CoV-2 target nucleic acids are DETECTED. The SARS-CoV-2 RNA is generally detectable in upper and lower  respiratory specimens dur ing the acute phase of infection.  Positive  results are indicative of active infection with SARS-CoV-2.  Clinical  correlation with patient history and other diagnostic information is  necessary to determine patient infection status.  Positive results do  not rule out bacterial infection or co-infection with other viruses. If result is PRESUMPTIVE POSTIVE SARS-CoV-2 nucleic acids MAY BE PRESENT.   A  presumptive positive result was obtained on the submitted specimen  and confirmed on repeat testing.  While 2019 novel coronavirus  (SARS-CoV-2) nucleic acids may be present in the submitted sample  additional confirmatory testing may be necessary for epidemiological  and / or clinical management purposes  to differentiate between  SARS-CoV-2 and other Sarbecovirus currently known to infect humans.  If clinically indicated additional testing with an alternate test  methodology [phone]) is advised. The SARS-CoV-2 RNA is generally  detectable in upper and lower respiratory sp ecimens during the acute  phase of infection. The expected result is Negative. Fact Sheet for Patients:  StrictlyIdeas.no Fact Sheet for Healthcare Providers: BankingDealers.co.za This test is not yet approved or cleared by the [place] FDA and has been authorized for detection and/or diagnosis of SARS-CoV-2 by FDA under an Emergency Use Authorization (EUA).  This EUA will remain in effect (meaning this test can be used) for the duration of the COVID-19 declaration under Section 564(b)(1) of the Act, 21 U.S.C. section 360bbb-3(b)(1), unless the authorization is terminated or revoked sooner. Performed at [facility], [place] [address] [place], [place] [address]          Radiology Studies: Ct Head Wo Contrast  Result Date: 07/20/2019 CLINICAL DATA:  Pain following recent trauma EXAM: CT HEAD WITHOUT CONTRAST TECHNIQUE: Contiguous axial images were obtained from the base of the skull through the vertex without intravenous contrast. COMPARISON:  December 05, 2018 FINDINGS: Brain: There is stable age related volume loss. There is no intracranial mass, hemorrhage, extra-axial fluid collection, or midline shift. There is small vessel disease in the centra semiovale bilaterally which appear stable. No acute infarct evident. Vascular: No hyperdense  vessel. There is calcification in each carotid siphon region as well as in each middle cerebral artery. Skull: Bony calvarium appears intact. Sinuses/Orbits: Visualized paranasal sinuses are clear. Orbits appear symmetric bilaterally. Other: Mastoid air cells are clear. 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 No acute infarct. No mass or hemorrhage. There are multiple foci of arterial vascular calcification. Electronically Signed   By: Lowella Grip III M.D.   On: 07/20/2019 14:12     Scheduled Meds: . amiodarone  200 mg Oral Daily  . amoxicillin  500 mg Oral Q8H  . carvedilol  12.5 mg Oral BID WC  . divalproex  500 mg Oral Daily  . DULoxetine  30 mg Oral Daily  . furosemide  40 mg Oral Daily  . levETIRAcetam  1,000 mg Oral BID  . phenytoin  100 mg Oral BID  . simvastatin  20 mg Oral q1800  . tamsulosin  0.4 mg Oral Daily   Continuous Infusions:   LOS: 0 days    Time spent: 25 minutes spent in the coordination of care.     Jonnie Finner, DO [facility] Pager [phone]  If 7PM-7AM, please contact night-coverage [email or web address] Password [facility] 07/21/2019, 11:03 AM

## 2019-07-22 DIAGNOSIS — Z95810 Presence of automatic (implantable) cardiac defibrillator: Secondary | ICD-10-CM

## 2019-07-22 DIAGNOSIS — Z98818 Other dental procedure status: Secondary | ICD-10-CM

## 2019-07-22 DIAGNOSIS — G40909 Epilepsy, unspecified, not intractable, without status epilepticus: Secondary | ICD-10-CM

## 2019-07-22 DIAGNOSIS — N189 Chronic kidney disease, unspecified: Secondary | ICD-10-CM

## 2019-07-22 DIAGNOSIS — I509 Heart failure, unspecified: Secondary | ICD-10-CM

## 2019-07-22 DIAGNOSIS — D6851 Activated protein C resistance: Secondary | ICD-10-CM

## 2019-07-22 DIAGNOSIS — N183 Chronic kidney disease, stage 3 (moderate): Secondary | ICD-10-CM

## 2019-07-22 LAB — CBC WITH DIFFERENTIAL/PLATELET
Abs Immature Granulocytes: 0.04 10*3/uL (ref 0.00–0.07)
Basophils Absolute: 0 10*3/uL (ref 0.0–0.1)
Basophils Relative: 0 %
Eosinophils Absolute: 0.1 10*3/uL (ref 0.0–0.5)
Eosinophils Relative: 1 %
HCT: 28 % — ABNORMAL LOW (ref 39.0–52.0)
Hemoglobin: 8.8 g/dL — ABNORMAL LOW (ref 13.0–17.0)
Immature Granulocytes: 1 %
Lymphocytes Relative: 17 %
Lymphs Abs: 1.1 10*3/uL (ref 0.7–4.0)
MCH: 30.7 pg (ref 26.0–34.0)
MCHC: 31.4 g/dL (ref 30.0–36.0)
MCV: 97.6 fL (ref 80.0–100.0)
Monocytes Absolute: 0.9 10*3/uL (ref 0.1–1.0)
Monocytes Relative: 15 %
Neutro Abs: 4.2 10*3/uL (ref 1.7–7.7)
Neutrophils Relative %: 66 %
Platelets: 149 10*3/uL — ABNORMAL LOW (ref 150–400)
RBC: 2.87 MIL/uL — ABNORMAL LOW (ref 4.22–5.81)
RDW: 14.7 % (ref 11.5–15.5)
WBC: 6.3 10*3/uL (ref 4.0–10.5)
nRBC: 1.1 % — ABNORMAL HIGH (ref 0.0–0.2)

## 2019-07-22 LAB — RENAL FUNCTION PANEL
Albumin: 3.1 g/dL — ABNORMAL LOW (ref 3.5–5.0)
Anion gap: 8 (ref 5–15)
BUN: 52 mg/dL — ABNORMAL HIGH (ref 8–23)
CO2: 24 mmol/L (ref 22–32)
Calcium: 8.4 mg/dL — ABNORMAL LOW (ref 8.9–10.3)
Chloride: 103 mmol/L (ref 98–111)
Creatinine, Ser: 1.51 mg/dL — ABNORMAL HIGH (ref 0.61–1.24)
GFR calc Af Amer: 53 mL/min — ABNORMAL LOW (ref >60)
GFR calc non Af Amer: 46 mL/min — ABNORMAL LOW (ref >60)
Glucose, Bld: 85 mg/dL (ref 70–99)
Phosphorus: 4.1 mg/dL (ref 2.5–4.6)
Potassium: 4.4 mmol/L (ref 3.5–5.1)
Sodium: 135 mmol/L (ref 135–145)

## 2019-07-22 LAB — DIC (DISSEMINATED INTRAVASCULAR COAGULATION)PANEL
D-Dimer, Quant: 0.57 ug/mL-FEU — ABNORMAL HIGH (ref 0.00–0.50)
Fibrinogen: 299 mg/dL (ref 210–475)
INR: 1.2 (ref 0.8–1.2)
Platelets: 152 10*3/uL (ref 150–400)
Prothrombin Time: 15.4 seconds — ABNORMAL HIGH (ref 11.4–15.2)
Smear Review: NONE SEEN
aPTT: 37 seconds — ABNORMAL HIGH (ref 24–36)

## 2019-07-22 LAB — MAGNESIUM: Magnesium: 2.2 mg/dL (ref 1.7–2.4)

## 2019-07-22 MED ORDER — AMINOCAPROIC ACID SOLUTION 5% (50 MG/ML)
10.0000 mL | ORAL | Status: AC
  Administered 2019-07-22 – 2019-07-23 (×8): 10 mL via ORAL
  Filled 2019-07-22: qty 100

## 2019-07-22 MED ORDER — AMINOCAPROIC ACID SOLUTION 5% (50 MG/ML)
10.0000 mL | ORAL | Status: DC
  Filled 2019-07-22: qty 100

## 2019-07-22 MED ORDER — SODIUM CHLORIDE 0.9% IV SOLUTION: 250.0000 mL | Freq: Once | INTRAVENOUS | Status: DC

## 2019-07-22 MED ORDER — HEPARIN SOD (PORK) LOCK FLUSH 100 UNIT/ML IV SOLN
500.0000 [IU] | Freq: Every day | INTRAVENOUS | Status: DC | PRN
  Filled 2019-07-22: qty 5

## 2019-07-22 MED ORDER — SODIUM CHLORIDE 0.9% FLUSH: 3.0000 mL | INTRAVENOUS | Status: DC | PRN

## 2019-07-22 MED ORDER — HEPARIN SOD (PORK) LOCK FLUSH 100 UNIT/ML IV SOLN
250.0000 [IU] | INTRAVENOUS | Status: DC | PRN
  Filled 2019-07-22: qty 2.5

## 2019-07-22 MED ORDER — ACETAMINOPHEN 325 MG PO TABS
650.0000 mg | ORAL_TABLET | Freq: Once | ORAL | Status: AC
  Administered 2019-07-22: 17:00:00 650 mg via ORAL
  Filled 2019-07-22: qty 2

## 2019-07-22 MED ORDER — SODIUM CHLORIDE 0.9% FLUSH: 10.0000 mL | INTRAVENOUS | Status: DC | PRN

## 2019-07-22 MED ORDER — DIPHENHYDRAMINE HCL 25 MG PO CAPS
25.0000 mg | ORAL_CAPSULE | Freq: Once | ORAL | Status: AC
  Administered 2019-07-22: 25 mg via ORAL
  Filled 2019-07-22: qty 1

## 2019-07-22 NOTE — Progress Notes (Signed)
PROGRESS NOTE    [facility].  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 Patient is poor historian. Wife could not pick up the phone. I reviewed his previous admissions. According to the patient, he has not been on blood thinners since he had that retroperitoneal bleed about 2 months ago. He had been otherwise doing well. He walks with a walker. Also has a history of seizure and is on multiple medications. He went to dentist and had almost all of his teeth removed on 07/08/2019, following that he had continued bleeding into his mouth and they had talked to their dentist who recommended to control with a gauze piece. His wife has been trying to feed him with Gatorade and chicken soup but otherwise he has not been able to eat much. Patient states that 2 days ago he fell and hit his head had some pain but improved now. The reason he came to the hospital today is feeling dizzy and blood pressure dropping on ambulation.   Assessment & Plan:   Principal Problem:   Anemia due to blood loss, acute Active Problems:   HTN (hypertension)   Coronary artery disease involving native heart   Automatic implantable cardioverter-defibrillator in situ   Seizure (HCC)   Renal insufficiency   Factor VII deficiency (HCC)   Chronic systolic CHF (congestive heart failure), NYHA class 2 (HCC)   Chronic kidney disease (CKD), stage III (moderate) (HCC)  Anemia due to acute blood loss, symptomatic anemia:      - s/p 1 unit pRBCs d/t symptomatic anemia; appropriate response (Hgb 8.3 - 9.3)     - oral bleeding from recent dental procedure (dental team consulted and is following)     - Increased bleeding this AM and overnight. Discussed with dental service and oncology, appreciate input. Plan to continue with another 10 doses of amicar.  - Plan for FFP infusion per Oncology   Dizziness/lightheadedness     - multifactorial; in part d/t above, hypotension also playing a role; also question if aFib playing a role     - Given small bolus fluid     - Stable at present  Chronic congestive heart failure status post AICD:      - d/t hypotension, continued rate controlling medication including amiodarone and beta-blockers, but stopped Aldactone and nitrates as well as hydralazine     - stable currently  Chronic atrial fibrillation     - Rate controlled on amiodarone and Coreg.  Unable to anticoagulate. - Seems stable at this time  History of seizure disorder:      - Unlikely recurrent seizure.       - He is on multiple medications.       - Valproic acid level is low, however will not change his seizure regimen. Stable  Chronic kidney disease stage III due to hypertension:      - At about his baseline; monitor   DVT prophylaxis: SCD's Code Status: Full Family Communication: Pt in room, family not at bedside Disposition Plan: Possible d/c home in 24hrs if stable  Consultants:   Dental service  Hematology  Procedures:     Antimicrobials: Anti-infectives (From admission, onward)   Start     Dose/Rate Route Frequency Ordered Stop   07/21/19 0845  amoxicillin (AMOXIL) capsule 500 mg     500 mg Oral Every 8 hours 07/21/19 0837 07/25/19 0559   07/20/19 2200  amoxicillin (AMOXIL) capsule 500 mg  Status:  Discontinued     500 mg Oral 2 times daily 07/20/19 1801 07/21/19 0837       Subjective: Reports continued bleeding overnight and this AM  Objective: Vitals:   07/22/19 0540  07/22/19 1405 07/22/19 1816 07/22/19 1831  BP: 106/75 (!) 87/62 (!) 78/55 (P) 93/63  Pulse: 84 83 79 (P) 77  Resp: 18 20 18  (P) 18  Temp: 97.7 F (36.5 C) 97.7 F (36.5 C) (!) 97.5 F (36.4 C) (P) 98.2 F (36.8 C)  TempSrc: Axillary Oral  (P) Oral  SpO2: 100% 95%    Weight: 89.3 kg     Height:        Intake/Output Summary (Last 24 hours) at 07/22/2019 1916 Last data filed at 07/22/2019 1300 Gross per 24 hour  Intake 360 ml  Output 1350 ml  Net -990 ml   Filed Weights   07/20/19 1844 07/21/19 0628 07/22/19 0540  Weight: 92.5 kg 90.6 kg 89.3 kg    Examination:  General exam: Appears calm and comfortable  Respiratory system: Clear to auscultation. Respiratory effort normal. Cardiovascular system: S1 & S2 heard, RRR Gastrointestinal system: Abdomen is nondistended, soft and nontender. No organomegaly or masses felt. Normal bowel sounds heard. Central nervous system: Alert and oriented. No focal neurological deficits. Extremities: Symmetric 5 x 5 power. Skin: No rashes, lesions Psychiatry: Judgement and insight appear normal. Mood & affect appropriate.   Data Reviewed: I have personally reviewed following labs and imaging studies  CBC: Recent Labs  Lab 07/20/19 1310 07/21/19 0329 07/22/19 0613 07/22/19 1224  WBC 5.6 5.7 6.3  --   NEUTROABS 3.9  --  4.2  --   HGB 8.3* 9.3* 8.8*  --   HCT 25.9* 29.4* 28.0*  --   MCV 94.9 95.1 97.6  --   PLT 126* 125* 149* 096   Basic Metabolic Panel: Recent Labs  Lab 07/20/19 1310 07/21/19 0329 07/22/19 0613  NA 133* 133* 135  K 4.3 4.3 4.4  CL 100 102 103  CO2 25 24 24   GLUCOSE 114* 96 85  BUN 60* 55* 52*  CREATININE 1.62* 1.51* 1.51*  CALCIUM 8.1* 8.3* 8.4*  MG  --   --  2.2  PHOS  --   --  4.1   GFR: Estimated Creatinine Clearance: 48.5 mL/min (A) (by C-G formula based on SCr of 1.51 mg/dL (H)). Liver Function Tests: Recent Labs  Lab 07/20/19 1310 07/22/19 0613  AST 17  --   ALT 17  --   ALKPHOS 65  --    BILITOT 0.5  --   PROT 7.2  --   ALBUMIN 3.0* 3.1*   No results for input(s): LIPASE, AMYLASE in the last 168 hours. No results for input(s): AMMONIA in the last 168 hours. Coagulation Profile: Recent Labs  Lab 07/22/19 1224  INR 1.2   Cardiac Enzymes: No results for input(s): CKTOTAL, CKMB, CKMBINDEX, TROPONINI in the last 168 hours. BNP (last 3 results) Recent Labs    11/26/18 1025  PROBNP 56.0   HbA1C: No results for input(s): HGBA1C in the last 72 hours. CBG: No results for input(s): GLUCAP in the last  168 hours. Lipid Profile: No results for input(s): CHOL, HDL, LDLCALC, TRIG, CHOLHDL, LDLDIRECT in the last 72 hours. Thyroid Function Tests: No results for input(s): TSH, T4TOTAL, FREET4, T3FREE, THYROIDAB in the last 72 hours. Anemia Panel: No results for input(s): VITAMINB12, FOLATE, FERRITIN, TIBC, IRON, RETICCTPCT in the last 72 hours. Sepsis Labs: No results for input(s): PROCALCITON, LATICACIDVEN in the last 168 hours.  Recent Results (from the past 240 hour(s))  SARS Coronavirus 2 (CEPHEID - Performed in [place] hospital lab), Hosp Order     Status: None   Collection Time: 07/20/19  1:58 PM   Specimen: Nasopharyngeal Swab  Result Value Ref Range Status   SARS Coronavirus 2 NEGATIVE NEGATIVE Final    Comment: (NOTE) If result is NEGATIVE SARS-CoV-2 target nucleic acids are NOT DETECTED. The SARS-CoV-2 RNA is generally detectable in upper and lower  respiratory specimens during the acute phase of infection. The lowest  concentration of SARS-CoV-2 viral copies this assay can detect is 250  copies / mL. A negative result does not preclude SARS-CoV-2 infection  and should not be used as the sole basis for treatment or other  patient management decisions.  A negative result may occur with  improper specimen collection / handling, submission of specimen other  than nasopharyngeal swab, presence of viral mutation(s) within the  areas targeted by this assay,  and inadequate number of viral copies  (<250 copies / mL). A negative result must be combined with clinical  observations, patient history, and epidemiological information. If result is POSITIVE SARS-CoV-2 target nucleic acids are DETECTED. The SARS-CoV-2 RNA is generally detectable in upper and lower  respiratory specimens dur ing the acute phase of infection.  Positive  results are indicative of active infection with SARS-CoV-2.  Clinical  correlation with patient history and other diagnostic information is  necessary to determine patient infection status.  Positive results do  not rule out bacterial infection or co-infection with other viruses. If result is PRESUMPTIVE POSTIVE SARS-CoV-2 nucleic acids MAY BE PRESENT.   A presumptive positive result was obtained on the submitted specimen  and confirmed on repeat testing.  While 2019 novel coronavirus  (SARS-CoV-2) nucleic acids may be present in the submitted sample  additional confirmatory testing may be necessary for epidemiological  and / or clinical management purposes  to differentiate between  SARS-CoV-2 and other Sarbecovirus currently known to infect humans.  If clinically indicated additional testing with an alternate test  methodology [phone]) is advised. The SARS-CoV-2 RNA is generally  detectable in upper and lower respiratory sp ecimens during the acute  phase of infection. The expected result is Negative. Fact Sheet for Patients:  StrictlyIdeas.no Fact Sheet for Healthcare Providers: BankingDealers.co.za This test is not yet approved or cleared by the [place] FDA and has been authorized for detection and/or diagnosis of SARS-CoV-2 by FDA under an Emergency Use Authorization (EUA).  This EUA will remain in effect (meaning this test can be used) for the duration of the COVID-19 declaration under Section 564(b)(1) of the Act, 21 U.S.C. section 360bbb-3(b)(1), unless  the authorization is terminated or revoked sooner. Performed at [facility], [place] [address] [place], [place] [address]      Radiology Studies: No results found.  Scheduled Meds: . sodium chloride  250 mL Intravenous Once  . aminocaproic acid  10 mL Oral Q1H  . amiodarone  200 mg Oral Daily  . amoxicillin  500 mg Oral Q8H  . carvedilol  12.5 mg Oral BID  WC  . divalproex  500 mg Oral Daily  . DULoxetine  30 mg Oral Daily  . feeding supplement (ENSURE ENLIVE)  237 mL Oral TID BM  . furosemide  40 mg Oral Daily  . levETIRAcetam  1,000 mg Oral BID  . phenytoin  100 mg Oral BID  . simvastatin  20 mg Oral q1800  . tamsulosin  0.4 mg Oral Daily   Continuous Infusions:   LOS: 1 day   Marylu Lund, MD [facility] Pager On Amion  If 7PM-7AM, please contact night-coverage 07/22/2019, 7:16 PM

## 2019-07-22 NOTE — Progress Notes (Signed)
PT Cancellation Note  Patient Details Name: [facility]. MRN: [id number] DOB: [birth year]   Cancelled Treatment:    Reason Eval/Treat Not Completed: Patient declined, no reason specified  Pt requesting we come back later to work with him. He reports his back and knee are really hurting him and that his pain medicine hasn't helped. ~5 minutes spent educating patient on benefits of mobilizing to reduce pain and prevent progressive weakness. Pt declined to move to chair or use bathroom as well. Will check in with pt at later time/date.  Kipp Brood, PT, DPT, [facility] Physical Therapist with [facility]  07/22/2019 11:03 AM

## 2019-07-22 NOTE — Progress Notes (Signed)
PT Cancellation Note  Patient Details Name: [facility]. MRN: [id number] DOB: 27-Oct-1946   Cancelled Treatment:    Reason Eval/Treat Not Completed: Patient declined, no reason specified;Medical issues which prohibited therapy  Pt with RN at therapist to arrival. Patient reports he just received medicine and is preparing to take more with RN. RN states he has not stopped bleeding since his oral surgery and they have orders for blood transfusion. She requests we hold until tomorrow and states that will be better.  Kipp Brood, PT, DPT, [facility] Physical Therapist with [facility]  07/22/2019 5:21 PM

## 2019-07-22 NOTE — Progress Notes (Signed)
07/22/2019  Patient:            Louis Ford. Date of Birth:  29-Jun-1946 MRN:                [id number]   BP (!) 87/62 (BP Location: Right Arm)   Pulse 83   Temp 97.7 F (36.5 C) (Oral)   Resp 20   Ht 5' 10.98" (1.803 m)   Wt 89.3 kg   SpO2 95%   BMI 27.47 kg/m    Lab Results  Component Value Date   WBC 6.3 07/22/2019   HGB 8.8 (L) 07/22/2019   HCT 28.0 (L) 07/22/2019   MCV 97.6 07/22/2019   PLT 152 07/22/2019    [facility] Louis Ford. is a 73 year old male with recent dental extractions on 07/16/2019.  Patient had persistent postoperative bleeding.  Patient subsequently presented to the emergency room with dizziness and anemia persistent oozing from dental extraction sites.  Patient was transfused with packed red blood cells.  I then evaluated the patient yesterday and ordered Amicar 5% rinses.  Patient  was to rinse with 10 mils every hour for 10 hours and then as needed for persistent oozing.  Patient used the Amicar rinses with good resolution of bleeding but then experienced some oozing during the later evening and early morning hours.  I received a phone call from the hospitalist , Dr. Sherrian Divers, requesting reevaluation of the postoperative bleeding at this time.  I recommended that Dr. Sherrian Divers restart the Amicar rinses at this time.  I also recommended that he follow-up with Dr. Burr Medico to see if she could provide additional recommendations for the postoperative bleeding.  SUBJECTIVE: The patient currently denies any significant bleeding and oozing at this time.  Patient indicates the extraction sites are still very tender and sore.  Gauze is present in the mouth with minimal heme noted on the gauze.  Patient has restarted using the Amicar rinses.  OBJECTIVE: There is no sign of active bleeding or bruising.  There is minimal heme noted on the gauze present in the mouth.  Clots are present and stable.  Patient is healing in by generalized secondary intention involving the upper and lower  extraction sites.  Minimal primary closure is noted.  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  PLAN: 1.  Patient is to continue use of "gentle" Amicar rinses for 10 doses.  Patient is to then use the Amicar rinse as needed for persistent oozing from dental extraction sites.  Patient is also to apply a series of 4 x 4 gauzes to the extraction sites with pressure as needed. 2.  Had a discussion with Dr. Burr Medico.  We discussed possible use of factor VII replacement versus the use of several units of FFP to assist in control postoperative bleeding.  Dr. Burr Medico will order the FFP at this time and then reevaluate the patient for discharge tomorrow morning if no significant bleeding overnight. Dr. Burr Medico also indicated that the patient had not been referred to [place] hemophilia clinic but was to have followed up with [place] at that time.  Patient did not do so.  Dr. Burr Medico has referred the patient to Dr. Irene Limbo for follow-up as an outpatient for his factor VII deficiency. 3.  If bleeding persists, we will consider consulting the oral surgeon on-call, Dr. [name], for his evaluation and provision of alternative treatment options.   Lenn Cal, [name]

## 2019-07-22 NOTE — Progress Notes (Signed)
[facility].   DOB:[birth year]   TD#:[id number]   BWI#:[id number]  Hematology follow up note   Subjective: Pt has known history of a mild  Factor VII deficiency, diagnosed in [place] when he had AICD placement with massive bleeding. He was seen by Dr. Beryle Beams in 2013 but did not follow up.  He presented with significant dental bleeding after 13 teeth extraction on 7/23.  He has required a blood transfusion, was seen by dentist Dr. Dorothyann Gibbs yesterday, who ordered Amicar 5% rinses.  Patient still has mild dental bleeding today, and I was called by Dr. Wyline Copas to see him. Pt has gauze in his mouth, does not want to talk much, and asked me to call his wife for his medical history.   Objective:  Vitals:   07/22/19 0540 07/22/19 1405  BP: 106/75 (!) 87/62  Pulse: 84 83  Resp: 18 20  Temp: 97.7 F (36.5 C) 97.7 F (36.5 C)  SpO2: 100% 95%    Body mass index is 27.47 kg/m.  Intake/Output Summary (Last 24 hours) at 07/22/2019 1621 Last data filed at 07/22/2019 1300 Gross per 24 hour  Intake 360 ml  Output 1350 ml  Net -990 ml     Sclerae unicteric  Skin; no petechia or ecchymosis  Oral exam showed no active bleeding, but has minimal old blood in dental extraction sites     CBG (last 3)  No results for input(s): GLUCAP in the last 72 hours.   Labs:  Lab Results  Component Value Date   WBC 6.3 07/22/2019   HGB 8.8 (L) 07/22/2019   HCT 28.0 (L) 07/22/2019   MCV 97.6 07/22/2019   PLT 152 07/22/2019   NEUTROABS 4.2 07/22/2019   Urine Studies No results for input(s): UHGB, CRYS in the last 72 hours.  Invalid input(s): UACOL, UAPR, USPG, UPH, UTP, UGL, UKET, UBIL, UNIT, UROB, ULEU, UEPI, UWBC, URBC, UBAC, CAST, [place], [place]  Basic Metabolic Panel: Recent Labs  Lab 07/20/19 1310 07/21/19 0329 07/22/19 0613  NA 133* 133* 135  K 4.3 4.3 4.4  CL 100 102 103  CO2 25 24 24   GLUCOSE 114* 96 85  BUN 60* 55* 52*  CREATININE 1.62* 1.51* 1.51*  CALCIUM 8.1* 8.3* 8.4*  MG  --   --  2.2   PHOS  --   --  4.1   GFR Estimated Creatinine Clearance: 48.5 mL/min (A) (by C-G formula based on SCr of 1.51 mg/dL (H)). Liver Function Tests: Recent Labs  Lab 07/20/19 1310 07/22/19 0613  AST 17  --   ALT 17  --   ALKPHOS 65  --   BILITOT 0.5  --   PROT 7.2  --   ALBUMIN 3.0* 3.1*   No results for input(s): LIPASE, AMYLASE in the last 168 hours. No results for input(s): AMMONIA in the last 168 hours. Coagulation profile Recent Labs  Lab 07/22/19 1224  INR 1.2    CBC: Recent Labs  Lab 07/20/19 1310 07/21/19 0329 07/22/19 0613 07/22/19 1224  WBC 5.6 5.7 6.3  --   NEUTROABS 3.9  --  4.2  --   HGB 8.3* 9.3* 8.8*  --   HCT 25.9* 29.4* 28.0*  --   MCV 94.9 95.1 97.6  --   PLT 126* 125* 149* 152   Cardiac Enzymes: No results for input(s): CKTOTAL, CKMB, CKMBINDEX, TROPONINI in the last 168 hours. BNP: Invalid input(s): POCBNP CBG: No results for input(s): GLUCAP in the last 168 hours. D-Dimer Recent Labs  07/22/19 1224  DDIMER 0.57*   Hgb A1c No results for input(s): HGBA1C in the last 72 hours. Lipid Profile No results for input(s): CHOL, HDL, LDLCALC, TRIG, CHOLHDL, LDLDIRECT in the last 72 hours. Thyroid function studies No results for input(s): TSH, T4TOTAL, T3FREE, THYROIDAB in the last 72 hours.  Invalid input(s): FREET3 Anemia work up No results for input(s): VITAMINB12, FOLATE, FERRITIN, TIBC, IRON, RETICCTPCT in the last 72 hours. Microbiology Recent Results (from the past 240 hour(s))  SARS Coronavirus 2 (CEPHEID - Performed in [place] hospital lab), Hosp Order     Status: None   Collection Time: 07/20/19  1:58 PM   Specimen: Nasopharyngeal Swab  Result Value Ref Range Status   SARS Coronavirus 2 NEGATIVE NEGATIVE Final    Comment: (NOTE) If result is NEGATIVE SARS-CoV-2 target nucleic acids are NOT DETECTED. The SARS-CoV-2 RNA is generally detectable in upper and lower  respiratory specimens during the acute phase of infection. The  lowest  concentration of SARS-CoV-2 viral copies this assay can detect is 250  copies / mL. A negative result does not preclude SARS-CoV-2 infection  and should not be used as the sole basis for treatment or other  patient management decisions.  A negative result may occur with  improper specimen collection / handling, submission of specimen other  than nasopharyngeal swab, presence of viral mutation(s) within the  areas targeted by this assay, and inadequate number of viral copies  (<250 copies / mL). A negative result must be combined with clinical  observations, patient history, and epidemiological information. If result is POSITIVE SARS-CoV-2 target nucleic acids are DETECTED. The SARS-CoV-2 RNA is generally detectable in upper and lower  respiratory specimens dur ing the acute phase of infection.  Positive  results are indicative of active infection with SARS-CoV-2.  Clinical  correlation with patient history and other diagnostic information is  necessary to determine patient infection status.  Positive results do  not rule out bacterial infection or co-infection with other viruses. If result is PRESUMPTIVE POSTIVE SARS-CoV-2 nucleic acids MAY BE PRESENT.   A presumptive positive result was obtained on the submitted specimen  and confirmed on repeat testing.  While 2019 novel coronavirus  (SARS-CoV-2) nucleic acids may be present in the submitted sample  additional confirmatory testing may be necessary for epidemiological  and / or clinical management purposes  to differentiate between  SARS-CoV-2 and other Sarbecovirus currently known to infect humans.  If clinically indicated additional testing with an alternate test  methodology [phone]) is advised. The SARS-CoV-2 RNA is generally  detectable in upper and lower respiratory sp ecimens during the acute  phase of infection. The expected result is Negative. Fact Sheet for Patients:   StrictlyIdeas.no Fact Sheet for Healthcare Providers: BankingDealers.co.za This test is not yet approved or cleared by the [place] FDA and has been authorized for detection and/or diagnosis of SARS-CoV-2 by FDA under an Emergency Use Authorization (EUA).  This EUA will remain in effect (meaning this test can be used) for the duration of the COVID-19 declaration under Section 564(b)(1) of the Act, 21 U.S.C. section 360bbb-3(b)(1), unless the authorization is terminated or revoked sooner. Performed at [facility], [place] [address], [place], [place] [address]       Studies:  No results found.  Assessment: 74 y.o.   1.  Postop bleeding from extensive dental extraction 2.  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 Anemia of blood loss  4. CHF  with AICD 5. AF 6. Seizure disorder  7. CKD    Plan:  -I discussed with Dr. Wyline Copas early today, lab reviewed, PT 1.2, d-dimer slightly elevated, fibrinogen 299.  Globin 8.8 today, slightly down from 9.3 yesterday. -I recommend 2 units of FFP today.  -continue amicar mouth wash  -Given his very mild factor VII deficiency, minimal bleeding now, I do not recommend factor VII replacement for now  -will check Factor VII level  -repeat CBC tomorrow  -I will schedule him to see my partner Dr. Irene Limbo in our clinic in a few weeks. He would benefit from prophylactic factor VII replacement before surgery or invasive procedure with high risk of bleeding in the future    Truitt Merle, MD 07/22/2019  4:21 PM

## 2019-07-23 DIAGNOSIS — K068 Other specified disorders of gingiva and edentulous alveolar ridge: Secondary | ICD-10-CM | POA: Diagnosis not present

## 2019-07-23 LAB — CBC
HCT: 20.8 % — ABNORMAL LOW (ref 39.0–52.0)
Hemoglobin: 6.7 g/dL — CL (ref 13.0–17.0)
MCH: 31.3 pg (ref 26.0–34.0)
MCHC: 32.2 g/dL (ref 30.0–36.0)
MCV: 97.2 fL (ref 80.0–100.0)
Platelets: 149 10*3/uL — ABNORMAL LOW (ref 150–400)
RBC: 2.14 MIL/uL — ABNORMAL LOW (ref 4.22–5.81)
RDW: 14.8 % (ref 11.5–15.5)
WBC: 5.6 10*3/uL (ref 4.0–10.5)
nRBC: 1.4 % — ABNORMAL HIGH (ref 0.0–0.2)

## 2019-07-23 LAB — PREPARE RBC (CROSSMATCH)

## 2019-07-23 MED ORDER — SODIUM CHLORIDE 0.9 % IV BOLUS
500.0000 mL | Freq: Once | INTRAVENOUS | Status: AC
  Administered 2019-07-23: 500 mL via INTRAVENOUS

## 2019-07-23 MED ORDER — AMINOCAPROIC ACID SOLUTION 5% (50 MG/ML)
10.0000 mL | ORAL | Status: DC | PRN
  Administered 2019-07-24 (×4): 10 mL via ORAL
  Filled 2019-07-23 (×2): qty 100

## 2019-07-23 MED ORDER — SODIUM CHLORIDE 0.9% IV SOLUTION
Freq: Once | INTRAVENOUS | Status: AC
  Administered 2019-07-23: 08:00:00 via INTRAVENOUS

## 2019-07-23 MED ORDER — AMINOCAPROIC ACID SOLUTION 5% (50 MG/ML)
10.0000 mL | ORAL | Status: AC
  Administered 2019-07-23 – 2019-07-24 (×10): 10 mL via ORAL
  Filled 2019-07-23: qty 100

## 2019-07-23 MED ORDER — SODIUM CHLORIDE 0.9% FLUSH
10.0000 mL | INTRAVENOUS | Status: DC | PRN
  Administered 2019-08-04: 10 mL
  Filled 2019-07-23: qty 40

## 2019-07-23 NOTE — Consult Note (Signed)
Reason for Consult: oral bleeding after dental extractions Referring Physician: Clint Lipps. is an 73 y.o. male who underwent dental extractions 07/16/2019 by general dentist in [place]. Patient had persistent post-op bleeding. Presented to ER 07/20/2019 and admitted. Has had Amicar 5% oral rinses, PRBC, FFP transfusions.     Past Medical History:  Diagnosis Date  . AICD (automatic cardioverter/defibrillator) present   . Atrial fibrillation   09/22/2012  . Blood loss anemia 04/18/2017   After GI bleed from colonoscopy and polypectomy  . CAD (coronary artery disease)   . Chronic systolic heart failure ([place])   . Factor VII deficiency ([place]) 05/2011  . Factor VII deficiency ([place]) 10/07/2012  . GIB (gastrointestinal bleeding) 02/20/2017  . History of MRSA infection 05/2011  . HTN (hypertension)   . Hx of adenomatous colonic polyps 02/20/2017   01/2017 - 3 cm sigmoid TV adenoma and other smaller polyps - had post-polypectomy bleed Tx w/ clips Consider repeat colonoscopy 3 yrs [name], MD, [name]   . Hyperlipidemia   . implantable cardiac defibrillator-Biotronik    Device Implanted 2006; s/p gen change 03/2011 : bleeding persistent with pocket erosion and infection; explant and reimplant  06/2011  . Ischemic cardiomyopathy    EF 15 to 20% by TTE and TEE in 09/2012.  Severe LV dysfunction  . Obesity    BMI 31 in 09/2012  . Persistent atrial fibrillation with rapid ventricular response 04/21/2014  . Retroperitoneal bleed 04/21/2019  . Seizure disorder ([place]) latest 09/30/2012  . Seizures ([place])   . Stroke [facility])     Past Surgical History:  Procedure Laterality Date  . CARDIAC CATHETERIZATION N/A 10/12/2015   Procedure: Right Heart Cath;  Surgeon: [name], MD;  Location: [place] CV LAB;  Service: Cardiovascular;  Laterality: N/A;  . CARDIOVERSION  09/24/2012   Procedure: CARDIOVERSION;  Surgeon: [name], MD;  Location: [facility] ENDOSCOPY;  Service:  Cardiovascular;  Laterality: N/A;  . CARDIOVERSION N/A 12/25/2017   Procedure: CARDIOVERSION;  Surgeon: [name], MD;  Location: [facility] ORS;  Service: Cardiovascular;  Laterality: N/A;  . COLONOSCOPY W/ POLYPECTOMY  02/13/2017  . CORONARY ARTERY BYPASS GRAFT  2011   in [place]  . FLEXIBLE SIGMOIDOSCOPY N/A 02/20/2017   Procedure: FLEXIBLE SIGMOIDOSCOPY;  Surgeon: [name], MD;  Location: [facility] ENDOSCOPY;  Service: Endoscopy;  Laterality: N/A;  . FLEXIBLE SIGMOIDOSCOPY N/A 02/21/2017   Procedure: FLEXIBLE SIGMOIDOSCOPY;  Surgeon: [name], MD;  Location[facility] ENDOSCOPY;  Service: Endoscopy;  Laterality: N/A;  . ICD    . TEE WITHOUT CARDIOVERSION  09/24/2012   Procedure: TRANSESOPHAGEAL ECHOCARDIOGRAM (TEE);  Surgeon: [name], MD;  Location: [facility] ENDOSCOPY;  Service: Cardiovascular;  Laterality: N/A;  dave/anesth, dl, cindy/echo     Family History  Problem Relation Age of Onset  . Arthritis Mother   . Heart disease Mother   . Heart attack Mother   . Other Father        smoker  . Hypertension Neg Hx        unknown  . Stroke Neg Hx        unknown    Social History:  reports that he quit smoking about 10 years ago. His smoking use included pipe. He quit smokeless tobacco use about 10 years ago. He reports that he does not drink alcohol or use drugs.  Allergies: No Known Allergies  Medications: I have reviewed the patient's current medications.  Results for orders placed or performed  during the hospital encounter of 07/20/19 (from the past 48 hour(s))  CBC with Differential/Platelet     Status: Abnormal   Collection Time: 07/22/19  6:13 AM  Result Value Ref Range   WBC 6.3 4.0 - 10.5 K/uL   RBC 2.87 (L) 4.22 - 5.81 MIL/uL   Hemoglobin 8.8 (L) 13.0 - 17.0 g/dL   HCT 28.0 (L) 39.0 - 52.0 %   MCV 97.6 80.0 - 100.0 fL   MCH 30.7 26.0 - 34.0 pg   MCHC 31.4 30.0 - 36.0 g/dL   RDW 14.7 11.5 - 15.5 %   Platelets 149 (L) 150 - 400 K/uL   nRBC 1.1 (H) 0.0 - 0.2 %   Neutrophils  Relative % 66 %   Neutro Abs 4.2 1.7 - 7.7 K/uL   Lymphocytes Relative 17 %   Lymphs Abs 1.1 0.7 - 4.0 K/uL   Monocytes Relative 15 %   Monocytes Absolute 0.9 0.1 - 1.0 K/uL   Eosinophils Relative 1 %   Eosinophils Absolute 0.1 0.0 - 0.5 K/uL   Basophils Relative 0 %   Basophils Absolute 0.0 0.0 - 0.1 K/uL   Immature Granulocytes 1 %   Abs Immature Granulocytes 0.04 0.00 - 0.07 K/uL    Comment: Performed at [facility], [place] [address], [place], [place] [address]  Magnesium     Status: None   Collection Time: 07/22/19  6:13 AM  Result Value Ref Range   Magnesium 2.2 1.7 - 2.4 mg/dL    Comment: Performed at [facility], [place] [address] [place], [place] [address]  Renal function panel     Status: Abnormal   Collection Time: 07/22/19  6:13 AM  Result Value Ref Range   Sodium 135 135 - 145 mmol/L   Potassium 4.4 3.5 - 5.1 mmol/L   Chloride 103 98 - 111 mmol/L   CO2 24 22 - 32 mmol/L   Glucose, Bld 85 70 - 99 mg/dL   BUN 52 (H) 8 - 23 mg/dL   Creatinine, Ser 1.51 (H) 0.61 - 1.24 mg/dL   Calcium 8.4 (L) 8.9 - 10.3 mg/dL   Phosphorus 4.1 2.5 - 4.6 mg/dL   Albumin 3.1 (L) 3.5 - 5.0 g/dL   GFR calc non Af Amer 46 (L) >60 mL/min   GFR calc Af Amer 53 (L) >60 mL/min   Anion gap 8 5 - 15    Comment: Performed at [facility], [place] [address], [place], [place] [address]  DIC (disseminated intravasc coag) panel     Status: Abnormal   Collection Time: 07/22/19 12:24 PM  Result Value Ref Range   Prothrombin Time 15.4 (H) 11.4 - 15.2 seconds   INR 1.2 0.8 - 1.2    Comment: (NOTE) INR goal varies based on device and disease states.    aPTT 37 (H) 24 - 36 seconds    Comment:        IF BASELINE aPTT IS ELEVATED, SUGGEST PATIENT RISK ASSESSMENT BE USED TO DETERMINE APPROPRIATE ANTICOAGULANT THERAPY.    Fibrinogen 299 210 - 475 mg/dL   D-Dimer, Quant 0.57 (H) 0.00 - 0.50 ug/mL-FEU    Comment: (NOTE) At the manufacturer cut-off of  0.50 ug/mL FEU, this assay has been documented to exclude PE with a sensitivity and negative predictive value of 97 to 99%.  At this time, this assay has not been approved by the FDA to exclude DVT/VTE. Results should be correlated with clinical presentation.  Platelets 152 150 - 400 K/uL   Smear Review NO SCHISTOCYTES SEEN     Comment: Performed at [facility], [place] [address] [place], [place] [address]  Prepare fresh frozen plasma     Status: None (Preliminary result)   Collection Time: 07/22/19  3:20 PM  Result Value Ref Range   Unit Number [id number]    Blood Component Type FP24, THAWED    Unit division 00    Status of Unit ISSUED,FINAL    Transfusion Status      OK TO TRANSFUSE Performed at [place] [address] [place], [place] [address]    Unit Number [id number]    Blood Component Type FFP, THAWED    Unit division 00    Status of Unit ISSUED    Transfusion Status OK TO TRANSFUSE   CBC     Status: Abnormal   Collection Time: 07/23/19  4:09 AM  Result Value Ref Range   WBC 5.6 4.0 - 10.5 K/uL   RBC 2.14 (L) 4.22 - 5.81 MIL/uL   Hemoglobin 6.7 (LL) 13.0 - 17.0 g/dL    Comment: This critical result has verified and been called to SLADE ,O. RN by POTEAT,SHANNON on 07 30 2020 at 0526, and has been read back. Critical Result Verified   HCT 20.8 (L) 39.0 - 52.0 %   MCV 97.2 80.0 - 100.0 fL   MCH 31.3 26.0 - 34.0 pg   MCHC 32.2 30.0 - 36.0 g/dL   RDW 14.8 11.5 - 15.5 %   Platelets 149 (L) 150 - 400 K/uL   nRBC 1.4 (H) 0.0 - 0.2 %    Comment: Performed at [facility], [place] [address], [place], [place] [address]  Prepare RBC     Status: None   Collection Time: 07/23/19  5:40 AM  Result Value Ref Range   Order Confirmation      ORDER PROCESSED BY BLOOD BANK Performed at [facility], [place] [address] [place], [name] [address]     No results found.  Blood pressure 101/73, pulse  82, temperature 98 F (36.7 C), temperature source Oral, resp. rate 16, height 5' 10.98" (1.803 m), weight 91.3 kg, SpO2 100 %. General appearance: alert, cooperative and no distress Head: Normocephalic, without obvious abnormality, atraumatic Eyes: negative Nose: Nares normal. Septum midline. Mucosa normal. No drainage or sinus tenderness. Throat: Edentulous maxilla and mandible s/p recent extractions. No sutures noted. No active bleeding. Some dark clotted areas upper right anterior area but no oozing. Pharynx clear, no exudate. Neck: no adenopathy  Assessment/Plan: 73 yo M with post-op bleeding s/p dental extractions 07/16/2019. Only slight bleeding on gauze noted today. Recommend continue current tx plan, possibly infuse Factor VII. If not successful, I can re-evaluate for surgical re-closure of areas.    [name] 07/23/2019, 3:33 PM  [phone]

## 2019-07-23 NOTE — Progress Notes (Signed)
AC approved for pts wife to come help calm and reorient pt while receiving blood transfusion to help with pt safety and care. Wifes name called to the front desk.

## 2019-07-23 NOTE — Progress Notes (Signed)
07/23/2019  Patient:            [name]. Date of Birth:  September 05, 1946 MRN:                [id number]   BP 91/70   Pulse (!) 101   Temp 98 F (36.7 C) (Oral)   Resp 18   Ht 5' 10.98" (1.803 m)   Wt 91.3 kg   SpO2 100%   BMI 28.09 kg/m    Lab Results  Component Value Date   WBC 5.6 07/23/2019   HGB 6.7 (LL) 07/23/2019   HCT 20.8 (L) 07/23/2019   MCV 97.2 07/23/2019   PLT 149 (L) 07/23/2019   BMET    Component Value Date/Time   NA 135 07/22/2019 0613   K 4.4 07/22/2019 0613   CL 103 07/22/2019 0613   CO2 24 07/22/2019 0613   GLUCOSE 85 07/22/2019 0613   BUN 52 (H) 07/22/2019 0613   CREATININE 1.51 (H) 07/22/2019 0613   CALCIUM 8.4 (L) 07/22/2019 0613   GFRNONAA 46 (L) 07/22/2019 0613   GFRAA 53 (L) 07/22/2019 [place]. is a 73 year old male with recent dental extractions on 07/16/2019.  Patient had persistent postoperative bleeding.  Patient subsequently presented to the emergency room with dizziness and anemia persistent oozing from dental extraction sites.  Patient was transfused with packed red blood cells.  I then evaluated the patient on 07/21/19 and ordered Amicar 5% rinses.  Patient  was to rinse with 10 mils every hour for 10 hours and then as needed for persistent oozing.  Patient used the Amicar rinses with good resolution of bleeding but then experienced some oozing during the later evening and early morning hours.  After discussion with Dr. Marylu Lund, the Amicar rinses were reordered for use for 10 hourly doses and then as needed for persistent oozing.  I then had a discussion with Dr. Burr Medico and she was going to order FFP transfusion that evening.  A CBC was obtained this morning and patient found to have significantly low hemoglobin levels and additional red blood cell transfusion was ordered.  Patient is now seen for follow-up evaluation of postoperative bleeding.    SUBJECTIVE: The patient currently is having some residual heme noted in the  mouth with a gauze in place.  The patient is currently receiving his blood transfusion.  Patient is still having some significant discomfort associated with the extraction sites.  Patient is not using the Amicar rinses at this time.    OBJECTIVE: There is some residual oozing from the dental extraction sites in the lower anterior area.  Gauze is in place with minimal heme in the mouth.  Patient still has discomfort associated with manipulation in the mouth.  Clots are noted within the extraction sites.  The patient is now edentulous.    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  PLAN: 1.  Patient is to continue use of "gentle" Amicar rinses for an additional 10 doses and then as needed for persistent oozing.  Patient is also to apply a series of 4 x 4 gauzes to the bleeding extraction sites with pressure as needed. 2.  I would suggest continued consultation with Dr. Burr Medico for her input concerning the need for factor VII replacement or other additional blood products as needed. 3.  I would also suggest obtaining an oral surgery consultation with Dr. [name] for his input concerning what is needed to assist in treatment  of this postoperative bleeding in this patient at this time.      Lenn Cal, [name]

## 2019-07-23 NOTE — Progress Notes (Signed)
Paged MD about current BP. Will start 2nd unit plasma after Ok'd with provider. Will continue to monitor.   Today's Vitals   07/22/19 1831 07/22/19 2020 07/22/19 2033 07/23/19 0004  BP: 93/63 93/68  (!) 80/64  Pulse: 77 78  89  Resp: 18 19  18   Temp: 98.2 F (36.8 C) 98 F (36.7 C)  98.1 F (36.7 C)  TempSrc: Oral Oral  Axillary  SpO2: 100% (!) 82% 100% 100%  Weight:      Height:      PainSc:   0-No pain    Body mass index is 27.47 kg/m.

## 2019-07-23 NOTE — Progress Notes (Signed)
CRITICAL VALUE STICKER  CRITICAL VALUE:  Hgb 6.7  DATE & TIME NOTIFIED:   7/30 @ 0525  MESSENGER (representative from lab): [name]  MD NOTIFIED:  [place]  TIME OF NOTIFICATION:  0530  RESPONSE: Awaiting Orders

## 2019-07-23 NOTE — Progress Notes (Signed)
PROGRESS NOTE    [facility].  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 Patient is poor historian. Wife could not pick up the phone. I reviewed his previous admissions. According to the patient, he has not been on blood thinners since he had that retroperitoneal bleed about 2 months ago. He had been otherwise doing well. He walks with a walker. Also has a history of seizure and is on multiple medications. He went to dentist and had almost all of his teeth removed on 07/08/2019, following that he had continued bleeding into his mouth and they had talked to their dentist who recommended to control with a gauze piece. His wife has been trying to feed him with Gatorade and chicken soup but otherwise he has not been able to eat much. Patient states that 2 days ago he fell and hit his head had some pain but improved now. The reason he came to the hospital today is feeling dizzy and blood pressure dropping on ambulation.   Assessment & Plan:   Principal Problem:   Anemia due to blood loss, acute Active Problems:   HTN (hypertension)   Coronary artery disease involving native heart   Automatic implantable cardioverter-defibrillator in situ   Seizure (HCC)   Renal insufficiency   Factor VII deficiency (HCC)   Chronic systolic CHF (congestive heart failure), NYHA class 2 (HCC)   Chronic kidney disease (CKD), stage III (moderate) (HCC)  Anemia due to acute blood loss, symptomatic anemia:      - s/p 1 unit pRBCs d/t symptomatic anemia; appropriate response (Hgb 8.3 - 9.3)     - oral bleeding from recent dental procedure (dental team consulted and is following)     - Increased bleeding recently, followed by dental service and oncology, appreciate input. Now s/p second 10 doses of amicar.  - Given FFP infusion per Oncology and 1 unit PRBC this AM for hgb of 6.7 - Discussed with Dr. Enrique Sack, recommendation for eval by oral surgeon, Dr. [name]  Dizziness/lightheadedness     - multifactorial; in part d/t above     - Given small bolus fluid earlier     - Stable currently  Chronic congestive heart failure status post AICD:      - d/t hypotension, continued rate controlling medication including amiodarone and beta-blockers, but stopped Aldactone and nitrates as well as hydralazine     - presently stable  Chronic atrial fibrillation     - Rate controlled on amiodarone and Coreg.  Unable to anticoagulate. - stable at this time  History of seizure disorder:      - Unlikely recurrent seizure.       - Continued on multiple medications.       - Valproic acid level is low, however will not change his seizure regimen. Stable  Chronic kidney disease stage III due to hypertension:      - At about his baseline; will cont to monitor  Dementia -Pt more agitated this AM and initially refused blood transfusion -Discussed with pt's wife who states she makes medical decisions for pt -When seen later  in AM and after pt spoke with wife over phone, pt seems more pleasant and appropriate   DVT prophylaxis: SCD's Code Status: Full Family Communication: Pt in room, wife over phone Disposition Plan: Possible d/c home when no longer bleeding  Consultants:   Dental service  Hematology  Oral surgeon  Procedures:     Antimicrobials: Anti-infectives (From admission, onward)   Start     Dose/Rate Route Frequency Ordered Stop   07/21/19 0845  amoxicillin (AMOXIL) capsule 500 mg      500 mg Oral Every 8 hours 07/21/19 0837 07/25/19 0559   07/20/19 2200  amoxicillin (AMOXIL) capsule 500 mg  Status:  Discontinued     500 mg Oral 2 times daily 07/20/19 1801 07/21/19 0837      Subjective: Still with some bleeding, needing blood transfusion, however pt iniitally refused  Objective: Vitals:   07/23/19 0500 07/23/19 0900 07/23/19 0924 07/23/19 1119  BP:  94/60 91/70 101/73  Pulse:  99 (!) 101 82  Resp:  18 18 16   Temp:  98 F (36.7 C) 98 F (36.7 C) 98 F (36.7 C)  TempSrc:  Oral Oral Oral  SpO2:  98% 100% 100%  Weight: 91.3 kg     Height:        Intake/Output Summary (Last 24 hours) at 07/23/2019 1523 Last data filed at 07/23/2019 1119 Gross per 24 hour  Intake 999.5 ml  Output 800 ml  Net 199.5 ml   Filed Weights   07/21/19 0628 07/22/19 0540 07/23/19 0500  Weight: 90.6 kg 89.3 kg 91.3 kg    Examination: General exam: Awake, laying in bed, in nad Respiratory system: Normal respiratory effort, no wheezing Cardiovascular system: regular rate, s1, s2 Gastrointestinal system: Soft, nondistended, positive BS Central nervous system: CN2-12 grossly intact, strength intact Extremities: Perfused, no clubbing Skin: Normal skin turgor, no notable skin lesions seen Psychiatry: Mood normal // no visual hallucinations   Data Reviewed: I have personally reviewed following labs and imaging studies  CBC: Recent Labs  Lab 07/20/19 1310 07/21/19 0329 07/22/19 0613 07/22/19 1224 07/23/19 0409  WBC 5.6 5.7 6.3  --  5.6  NEUTROABS 3.9  --  4.2  --   --   HGB 8.3* 9.3* 8.8*  --  6.7*  HCT 25.9* 29.4* 28.0*  --  20.8*  MCV 94.9 95.1 97.6  --  97.2  PLT 126* 125* 149* 152 742*   Basic Metabolic Panel: Recent Labs  Lab 07/20/19 1310 07/21/19 0329 07/22/19 0613  NA 133* 133* 135  K 4.3 4.3 4.4  CL 100 102 103  CO2 25 24 24   GLUCOSE 114* 96 85  BUN 60* 55* 52*  CREATININE 1.62* 1.51* 1.51*  CALCIUM 8.1* 8.3* 8.4*  MG  --   --  2.2  PHOS  --   --   4.1   GFR: Estimated Creatinine Clearance: 52.6 mL/min (A) (by C-G formula based on SCr of 1.51 mg/dL (H)). Liver Function Tests: Recent Labs  Lab 07/20/19 1310 07/22/19 0613  AST 17  --   ALT 17  --   ALKPHOS 65  --   BILITOT 0.5  --   PROT 7.2  --   ALBUMIN 3.0* 3.1*   No results for input(s): LIPASE, AMYLASE in the last 168 hours. No results for input(s): AMMONIA in the last 168 hours. Coagulation Profile: Recent Labs  Lab 07/22/19 1224  INR 1.2   Cardiac Enzymes: No results for input(s): CKTOTAL, CKMB, CKMBINDEX, TROPONINI in the  last 168 hours. BNP (last 3 results) Recent Labs    11/26/18 1025  PROBNP 56.0   HbA1C: No results for input(s): HGBA1C in the last 72 hours. CBG: No results for input(s): GLUCAP in the last 168 hours. Lipid Profile: No results for input(s): CHOL, HDL, LDLCALC, TRIG, CHOLHDL, LDLDIRECT in the last 72 hours. Thyroid Function Tests: No results for input(s): TSH, T4TOTAL, FREET4, T3FREE, THYROIDAB in the last 72 hours. Anemia Panel: No results for input(s): VITAMINB12, FOLATE, FERRITIN, TIBC, IRON, RETICCTPCT in the last 72 hours. Sepsis Labs: No results for input(s): PROCALCITON, LATICACIDVEN in the last 168 hours.  Recent Results (from the past 240 hour(s))  SARS Coronavirus 2 (CEPHEID - Performed in [place] hospital lab), Hosp Order     Status: None   Collection Time: 07/20/19  1:58 PM   Specimen: Nasopharyngeal Swab  Result Value Ref Range Status   SARS Coronavirus 2 NEGATIVE NEGATIVE Final    Comment: (NOTE) If result is NEGATIVE SARS-CoV-2 target nucleic acids are NOT DETECTED. The SARS-CoV-2 RNA is generally detectable in upper and lower  respiratory specimens during the acute phase of infection. The lowest  concentration of SARS-CoV-2 viral copies this assay can detect is 250  copies / mL. A negative result does not preclude SARS-CoV-2 infection  and should not be used as the sole basis for treatment or other  patient  management decisions.  A negative result may occur with  improper specimen collection / handling, submission of specimen other  than nasopharyngeal swab, presence of viral mutation(s) within the  areas targeted by this assay, and inadequate number of viral copies  (<250 copies / mL). A negative result must be combined with clinical  observations, patient history, and epidemiological information. If result is POSITIVE SARS-CoV-2 target nucleic acids are DETECTED. The SARS-CoV-2 RNA is generally detectable in upper and lower  respiratory specimens dur ing the acute phase of infection.  Positive  results are indicative of active infection with SARS-CoV-2.  Clinical  correlation with patient history and other diagnostic information is  necessary to determine patient infection status.  Positive results do  not rule out bacterial infection or co-infection with other viruses. If result is PRESUMPTIVE POSTIVE SARS-CoV-2 nucleic acids MAY BE PRESENT.   A presumptive positive result was obtained on the submitted specimen  and confirmed on repeat testing.  While 2019 novel coronavirus  (SARS-CoV-2) nucleic acids may be present in the submitted sample  additional confirmatory testing may be necessary for epidemiological  and / or clinical management purposes  to differentiate between  SARS-CoV-2 and other Sarbecovirus currently known to infect humans.  If clinically indicated additional testing with an alternate test  methodology [phone]) is advised. The SARS-CoV-2 RNA is generally  detectable in upper and lower respiratory sp ecimens during the acute  phase of infection. The expected result is Negative. Fact Sheet for Patients:  StrictlyIdeas.no Fact Sheet for Healthcare Providers: BankingDealers.co.za This test is not yet approved or cleared by the [place] FDA and has been authorized for detection and/or diagnosis of SARS-CoV-2 by FDA under  an Emergency Use Authorization (EUA).  This EUA will remain in effect (meaning this test can be used) for the duration of the COVID-19 declaration under Section 564(b)(1) of the Act, 21 U.S.C. section 360bbb-3(b)(1), unless the authorization is terminated or revoked sooner. Performed at [facility], [place] [address] [name], [place] [address]      Radiology Studies: No results found.  Scheduled Meds: . sodium  chloride  250 mL Intravenous Once  . aminocaproic acid  10 mL Oral Q1H  . amiodarone  200 mg Oral Daily  . amoxicillin  500 mg Oral Q8H  . carvedilol  12.5 mg Oral BID WC  . divalproex  500 mg Oral Daily  . DULoxetine  30 mg Oral Daily  . feeding supplement (ENSURE ENLIVE)  237 mL Oral TID BM  . furosemide  40 mg Oral Daily  . levETIRAcetam  1,000 mg Oral BID  . phenytoin  100 mg Oral BID  . simvastatin  20 mg Oral q1800  . tamsulosin  0.4 mg Oral Daily   Continuous Infusions:   LOS: 2 days   Marylu Lund, MD [facility] Pager On Amion  If 7PM-7AM, please contact night-coverage 07/23/2019, 3:23 PM

## 2019-07-23 NOTE — Progress Notes (Signed)
RN came to patient's room at 0800 to assess IV and get ready for blood transfusion. Patient stated that he did not want to have blood transfusion or IV check. He wanted to go home. Patient was very agitated and no cooperative after RN educated about his low hgb. Paged MD and called his wife as well.

## 2019-07-23 NOTE — Progress Notes (Signed)
PT Cancellation Note  Patient Details Name: Louis Ford. MRN: [id number] DOB: 17-Dec-1946   Cancelled Treatment:    Reason Eval/Treat Not Completed: Patient not medically ready. Pt with Hgb of 6.7 and currently receiving unit of blood.  Will check back as schedule permits. Nurse in agreement.   Galen Manila 07/23/2019, 9:34 AM

## 2019-07-23 NOTE — Evaluation (Signed)
Physical Therapy Evaluation Patient Details Name: [facility]. MRN: [id number] DOB: [birth year] Today's Date: 07/23/2019   History of Present Illness  [facility] is a 73 y.o. 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  Clinical Impression  Pt admitted with above diagnosis. Pt currently with functional limitations due to the deficits listed below (see PT Problem List). Pt will benefit from skilled PT to increase their independence and safety with mobility to allow discharge to the venue listed below.  Pt agreeable to get OOB, but not ambulation. At this time, recommend SNF and would benefit from SW consult. His orthostatics were as follows:  Supine 92/64 HR 117 Sitting 96/72 HR 120 Standing 96/75 HR 119     Follow Up Recommendations SNF;Supervision/Assistance - 24 hour    Equipment Recommendations       Recommendations for Other Services (social work)     Precautions / Restrictions Precautions Precautions: Fall Precaution Comments: monitor orthostatics Restrictions Weight Bearing Restrictions: No      Mobility  Bed Mobility   Bed Mobility: Supine to Sit     Supine to sit: Min guard;HOB elevated     General bed mobility comments: A for lines/sheets, but able to come to EOB with min/guard with head elevated and rails  Transfers Overall transfer level: Needs assistance Equipment used: Rolling walker (2 wheeled) Transfers: Sit to/from [facility] Sit to Stand: +2 safety/equipment;Min assist Stand pivot transfers: Min assist;+2 safety/equipment        General transfer comment: Pt instructed to scoot forward 1st prior to standing, but didn't and started to initate stand from too far back  Ambulation/Gait             General Gait Details: Pt declined gait  Stairs            Wheelchair Mobility    Modified Rankin (Stroke Patients Only)       Balance Overall balance assessment: Needs assistance Sitting-balance support: Feet supported Sitting balance-Leahy Scale: Fair       Standing balance-Leahy Scale: Poor Standing balance comment: required UE support                             Pertinent Vitals/Pain Pain Assessment: Faces Faces Pain Scale: Hurts little more Pain Location: mouth Pain Descriptors / Indicators: Sore Pain Intervention(s): Monitored during session    Home Living Family/patient expects to be discharged to:: Skilled nursing facility Living Arrangements: Spouse/significant other Available Help at Discharge: Family Type of Home: Apartment Home Access: Level entry     Home Layout: One level Home Equipment: Cane - single point;Walker - 2 wheels;Toilet riser;Shower seat      Prior Function Level of Independence: Independent with assistive device(s)               Hand Dominance   Dominant Hand: Right    Extremity/Trunk Assessment   Upper Extremity Assessment Upper Extremity Assessment: Defer to OT evaluation    Lower Extremity Assessment Lower Extremity Assessment: Overall WFL for tasks assessed;Generalized weakness       Communication   Communication: No difficulties  Cognition Arousal/Alertness: Awake/alert  Behavior During Therapy: WFL for tasks assessed/performed Overall Cognitive Status: Within Functional Limits for tasks assessed                                 General Comments: At times would say things that didn't quite fit into the conversation, but overall able to work with PT and follow directions.      General Comments General  comments (skin integrity, edema, etc.): Pt with gauze in mouth and difficult to understand at times.    Exercises     Assessment/Plan    PT Assessment Patient needs continued PT services  PT Problem List Decreased strength;Decreased activity tolerance;Decreased balance;Decreased mobility;Decreased knowledge of use of DME;Decreased safety awareness       PT Treatment Interventions      PT Goals (Current goals can be found in the Care Plan section)  Acute Rehab PT Goals Patient Stated Goal: agreeable to get OOB PT Goal Formulation: With patient Time For Goal Achievement: 08/06/19 Potential to Achieve Goals: Fair    Frequency Min 2X/week   Barriers to discharge        Co-evaluation               AM-PAC PT "6 Clicks" Mobility  Outcome Measure Help needed turning from your back to your side while in a flat bed without using bedrails?: A Little Help needed moving from lying on your back to sitting on the side of a flat bed without using bedrails?: A Little Help needed moving to and from a bed to a chair (including a wheelchair)?: A Little Help needed standing up from a chair using your arms (e.g., wheelchair or bedside chair)?: A Little Help needed to walk in hospital room?: A Lot Help needed climbing 3-5 steps with a railing? : A Lot 6 Click Score: 16    End of Session Equipment Utilized During Treatment: Gait belt Activity Tolerance: Patient tolerated treatment well Patient left: in chair;with call bell/phone within reach;with chair alarm set Nurse Communication: Mobility status(nurse tech after session, RN okayed session) PT Visit Diagnosis: Difficulty in walking, not elsewhere classified (R26.2);Muscle weakness (generalized) (M62.81)    Time: 3435-6861 PT Time Calculation (min) (ACUTE ONLY): 24 min   Charges:   PT Evaluation $PT Eval Moderate Complexity: 1 Mod PT Treatments $Therapeutic Activity: 8-22 mins        Merle Whitehorn L. Tamala Julian, [place] Pager  [phone] 07/23/2019   Galen Manila 07/23/2019, 2:31 PM

## 2019-07-24 LAB — BASIC METABOLIC PANEL
Anion gap: 5 (ref 5–15)
BUN: 24 mg/dL — ABNORMAL HIGH (ref 8–23)
CO2: 24 mmol/L (ref 22–32)
Calcium: 7.4 mg/dL — ABNORMAL LOW (ref 8.9–10.3)
Chloride: 107 mmol/L (ref 98–111)
Creatinine, Ser: 1.14 mg/dL (ref 0.61–1.24)
GFR calc Af Amer: >60 mL/min (ref >60)
GFR calc non Af Amer: >60 mL/min (ref >60)
Glucose, Bld: 108 mg/dL — ABNORMAL HIGH (ref 70–99)
Potassium: 3.5 mmol/L (ref 3.5–5.1)
Sodium: 136 mmol/L (ref 135–145)

## 2019-07-24 LAB — PREPARE FRESH FROZEN PLASMA
Unit division: 0
Unit division: 0

## 2019-07-24 LAB — BPAM RBC
Blood Product Expiration Date: 202008242359
Blood Product Expiration Date: 202008272359
ISSUE DATE / TIME: 202007272145
ISSUE DATE / TIME: 202007300905
Unit Type and Rh: 5100
Unit Type and Rh: 5100

## 2019-07-24 LAB — TYPE AND SCREEN
ABO/RH(D): O POS
Antibody Screen: NEGATIVE
Unit division: 0
Unit division: 0

## 2019-07-24 LAB — BPAM FFP
Blood Product Expiration Date: 202007301530
Blood Product Expiration Date: 202007301530
ISSUE DATE / TIME: 202007291803
ISSUE DATE / TIME: 202007300052
Unit Type and Rh: 5100
Unit Type and Rh: 9500

## 2019-07-24 LAB — FACTOR 7 ASSAY: Factor VII Activity: 69 % (ref 51–186)

## 2019-07-24 LAB — CBC
HCT: 25.8 % — ABNORMAL LOW (ref 39.0–52.0)
Hemoglobin: 8 g/dL — ABNORMAL LOW (ref 13.0–17.0)
MCH: 30.1 pg (ref 26.0–34.0)
MCHC: 31 g/dL (ref 30.0–36.0)
MCV: 97 fL (ref 80.0–100.0)
Platelets: 191 10*3/uL (ref 150–400)
RBC: 2.66 MIL/uL — ABNORMAL LOW (ref 4.22–5.81)
RDW: 15.7 % — ABNORMAL HIGH (ref 11.5–15.5)
WBC: 6 10*3/uL (ref 4.0–10.5)
nRBC: 1.7 % — ABNORMAL HIGH (ref 0.0–0.2)

## 2019-07-24 MED ORDER — VITAMIN K1 10 MG/ML IJ SOLN
20.0000 mg | Freq: Once | INTRAVENOUS | Status: AC
  Administered 2019-07-24: 20 mg via INTRAVENOUS
  Filled 2019-07-24: qty 2

## 2019-07-24 MED ORDER — AMINOCAPROIC ACID 500 MG PO TABS
5.0000 g | ORAL_TABLET | Freq: Three times a day (TID) | ORAL | Status: AC
  Administered 2019-07-24 – 2019-07-25 (×2): 5 g via ORAL
  Filled 2019-07-24 (×2): qty 10

## 2019-07-24 MED ORDER — AMINOCAPROIC ACID SOLUTION 5% (50 MG/ML)
10.0000 mL | ORAL | Status: DC | PRN
  Administered 2019-07-24 – 2019-07-25 (×2): 10 mL via ORAL
  Filled 2019-07-24: qty 100

## 2019-07-24 MED ORDER — LACTATED RINGERS IV SOLN
INTRAVENOUS | Status: DC
  Administered 2019-07-24: 19:00:00 via INTRAVENOUS

## 2019-07-24 NOTE — Progress Notes (Addendum)
Nurse at bedside to administer medications BP 81/61 HR 113 BP recheck 95/75 HR 110 Coreg dose scheduled for now held and message sent to Dr. Wyline Copas. Patient is asymptomatic. Will continue to monitor patient. Dr Wyline Copas returned call and will implement orders for IV fluids.

## 2019-07-24 NOTE — NC FL2 (Signed)
[place] MEDICAID FL2 LEVEL OF CARE SCREENING TOOL     IDENTIFICATION  Patient Name: [facility]. Birthdate: [birth year] Sex: male Admission Date (Current Location): 07/20/2019  [facility] and [place] Number:  Herbalist and Address:  [facility],  [place], [place]      Provider Number: [id number]  Attending Physician Name and Address:  Donne Hazel, MD  Relative Name and Phone Number:  [phone] Wife Mardene Celeste    Current Level of Care: Hospital Recommended Level of Care: [place] Prior Approval Number:    Date Approved/Denied:   PASRR Number: [id number]  Discharge Plan: SNF    Current Diagnoses: Patient Active Problem List   Diagnosis Date Noted  . Anemia due to blood loss, acute 07/20/2019  . Retroperitoneal bleed 04/21/2019  . Retroperitoneal hematoma   . Right arm pain 11/26/2018  . Hematoma 03/26/2018  . Leg pain 03/04/2018  . 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 Elevated TSH 10/27/2017  . Weakness of both lower extremities 10/27/2017  . Unsteady gait 10/27/2017  . Localization-related symptomatic epilepsy and epileptic syndromes with complex partial seizures, not intractable, without status epilepticus ([place]) 10/08/2017  . Insomnia 09/02/2017  . Degenerative joint disease of knee, left 08/01/2017  . Blood loss anemia 04/18/2017  . Hx of adenomatous colonic polyps 02/20/2017  . Chronic pain of left knee 01/22/2017  . Chronic kidney disease (CKD), stage III (moderate) ([place]) 11/08/2016  . Hypertensive heart disease without heart failure   . Chronic chest wall pain 08/17/2016  . Periodontal disease 05/18/2016  . HLD (hyperlipidemia) 02/26/2016  . CVA (cerebral infarction) 02/26/2016  . Near syncope 02/26/2016  . Gout 12/30/2015  . Persistent atrial fibrillation with rapid ventricular response 04/21/2014  . Chronic systolic CHF (congestive heart failure), NYHA class 2 ([place]) 11/06/2012  . Factor  VII deficiency ([place]) 10/07/2012  . Seizure ([place]) 10/01/2012  . Renal insufficiency 10/01/2012  . Ischemic cardiomyopathy  ? additional rate component 09/29/2012  . Automatic implantable cardioverter-defibrillator in situ   . HTN (hypertension) 09/21/2012  . Coronary artery disease involving native heart 09/21/2012    Orientation RESPIRATION BLADDER Height & Weight     Self, Time, Situation, Place  Normal Continent Weight: 201 lb 6.4 oz (91.4 kg) Height:  5' 10.98" (180.3 cm)  BEHAVIORAL SYMPTOMS/MOOD NEUROLOGICAL BOWEL NUTRITION STATUS      Continent Diet(full liquid diet- see DC summary for updated diet)  AMBULATORY STATUS COMMUNICATION OF NEEDS Skin   Extensive Assist Verbally Normal                       Personal Care Assistance Level of Assistance  Bathing, Feeding, Dressing Bathing Assistance: Maximum assistance Feeding assistance: Limited assistance Dressing Assistance: Maximum assistance     Functional Limitations Info  Sight, Hearing, Speech Sight Info: Adequate Hearing Info: Adequate Speech Info: Adequate(currently with guaze in mouth therefore speech impeded)    SPECIAL CARE FACTORS FREQUENCY  PT (By licensed PT), OT (By licensed OT)     PT Frequency: 5x OT Frequency: 5x            Contractures Contractures Info: Not present    Additional Factors Info  Code Status, Allergies Code Status Info: full code Allergies Info: nka           Current Medications (07/24/2019):  This is the current hospital active medication list Current Facility-Administered Medications  Medication Dose Route Frequency Provider Last Rate Last Dose  .  0.9 %  sodium chloride infusion (Manually program via Guardrails IV Fluids)  250 mL Intravenous Once Truitt Merle, MD      . acetaminophen (TYLENOL) tablet 650 mg  650 mg Oral Q6H PRN Barb Merino, MD      . acetaminophen-codeine (TYLENOL #3) 300-30 MG per tablet 1 tablet  1 tablet Oral Q6H PRN Barb Merino, MD   1 tablet at  07/24/19 0544  . aminocaproic acid (AMICAR) oral solution 50 mg/mL (5%), 100 ml  10 mL Oral Q1H PRN Truitt Merle, MD   10 mL at 07/24/19 1235  . amiodarone (PACERONE) tablet 200 mg  200 mg Oral Daily Barb Merino, MD   200 mg at 07/24/19 0858  . amoxicillin (AMOXIL) capsule 500 mg  500 mg Oral Q8H Kyle, Tyrone A, DO   500 mg at 07/24/19 0544  . carvedilol (COREG) tablet 12.5 mg  12.5 mg Oral BID WC Barb Merino, MD   12.5 mg at 07/24/19 0858  . divalproex (DEPAKOTE ER) 24 hr tablet 500 mg  500 mg Oral Daily Barb Merino, MD   500 mg at 07/24/19 0859  . DULoxetine (CYMBALTA) DR capsule 30 mg  30 mg Oral Daily Barb Merino, MD   30 mg at 07/24/19 0859  . feeding supplement (ENSURE ENLIVE) (ENSURE ENLIVE) liquid 237 mL  237 mL Oral TID BM Kyle, Tyrone A, DO   237 mL at 07/24/19 0913  . furosemide (LASIX) tablet 40 mg  40 mg Oral Daily Barb Merino, MD   40 mg at 07/24/19 0858  . heparin lock flush 100 unit/mL  500 Units Intracatheter Daily PRN Truitt Merle, MD      . heparin lock flush 100 unit/mL  250 Units Intracatheter PRN Truitt Merle, MD      . levETIRAcetam (KEPPRA) tablet 1,000 mg  1,000 mg Oral BID Barb Merino, MD   1,000 mg at 07/24/19 0858  . ondansetron (ZOFRAN) tablet 4 mg  4 mg Oral Q6H PRN Barb Merino, MD       Or  . ondansetron (ZOFRAN) injection 4 mg  4 mg Intravenous Q6H PRN Barb Merino, MD      . phenytoin (DILANTIN) ER capsule 100 mg  100 mg Oral BID Barb Merino, MD   100 mg at 07/24/19 0858  . polyethylene glycol (MIRALAX / GLYCOLAX) packet 17 g  17 g Oral Daily PRN Barb Merino, MD      . simvastatin (ZOCOR) tablet 20 mg  20 mg Oral q1800 Barb Merino, MD   20 mg at 07/23/19 1806  . sodium chloride flush (NS) 0.9 % injection 10 mL  10 mL Intracatheter PRN Truitt Merle, MD      . sodium chloride flush (NS) 0.9 % injection 10-40 mL  10-40 mL Intracatheter PRN Donne Hazel, MD      . sodium chloride flush (NS) 0.9 % injection 3 mL  3 mL Intracatheter PRN Truitt Merle,  MD      . tamsulosin [facility]) capsule 0.4 mg  0.4 mg Oral Daily Barb Merino, MD   0.4 mg at 07/24/19 0858  . traMADol (ULTRAM) tablet 50 mg  50 mg Oral Q6H PRN Barb Merino, MD         Discharge Medications: Please see discharge summary for a list of discharge medications.  Relevant Imaging Results:  Relevant Lab Results:   Additional Information SS# [id number]  Nila Nephew, LCSW

## 2019-07-24 NOTE — Care Management Important Message (Signed)
Important Message  Patient Details IM Letter given to Sharren Bridge SW to present to the Patient Name: [facility]. MRN: [id number] Date of Birth: 11-30-1946   Medicare Important Message Given:  Yes     Kerin Salen 07/24/2019, 10:51 AM

## 2019-07-24 NOTE — Progress Notes (Signed)
PROGRESS NOTE    [facility].  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 Patient is poor historian. Wife could not pick up the phone. I reviewed his previous admissions. According to the patient, he has not been on blood thinners since he had that retroperitoneal bleed about 2 months ago. He had been otherwise doing well. He walks with a walker. Also has a history of seizure and is on multiple medications. He went to dentist and had almost all of his teeth removed on 07/08/2019, following that he had continued bleeding into his mouth and they had talked to their dentist who recommended to control with a gauze piece. His wife has been trying to feed him with Gatorade and chicken soup but otherwise he has not been able to eat much. Patient states that 2 days ago he fell and hit his head had some pain but improved now. The reason he came to the hospital today is feeling dizzy and blood pressure dropping on ambulation.   Assessment & Plan:   Principal Problem:   Anemia due to blood loss, acute Active Problems:   HTN (hypertension)   Coronary artery disease involving native heart   Automatic implantable cardioverter-defibrillator in situ   Seizure (HCC)   Renal insufficiency   Factor VII deficiency (HCC)   Chronic systolic CHF (congestive heart failure), NYHA class 2 (HCC)   Chronic kidney disease (CKD), stage III (moderate) (HCC)  Anemia due to acute blood loss, symptomatic anemia:      - s/p 1 unit pRBCs d/t symptomatic anemia; appropriate response (Hgb 8.3 - 9.3)     - oral bleeding from recent dental procedure (dental team consulted and is following)     - Increased bleeding recently, followed by dental service and oncology, appreciate input. Now s/p second round of 10 doses of amicar.  - Given FFP infusion per Oncology and 1 unit PRBC 7/30 with post-transfusion hgb of 8.0 - Discussed with Dr. Enrique Sack, recommendation for eval by oral surgeon, Dr. [name] -Per Oncology, one dose of vit K todaywith amicar mouth wash q1h for today, consideration for suturing or packing dental wounds  Dizziness/lightheadedness     - multifactorial; in part d/t above     - Given small bolus fluid earlier     - Stable currently  Chronic congestive heart failure status post AICD:      - d/t hypotension, continued rate controlling medication including amiodarone and beta-blockers, but stopped Aldactone and nitrates as well as hydralazine     - presently stable  Chronic atrial fibrillation     - Rate controlled on amiodarone and Coreg.  Unable to anticoagulate. - stable at this time  History of seizure disorder:      - Unlikely recurrent seizure.       - Continued on multiple medications.       - Valproic acid level initially noted to be low, cont current regimen  Chronic kidney disease stage III due to hypertension:      - At about his baseline; will cont to monitor  Dementia -Pt more agitated this  AM and initially refused blood transfusion -Discussed with pt's wife who states she makes medical decisions for pt -Pt this AM conversing appropriately   DVT prophylaxis: SCD's Code Status: Full Family Communication: Pt in room, family not in room Disposition Plan: Possible d/c home when no longer bleeding  Consultants:   Dental service  Hematology  Oral surgeon  Procedures:     Antimicrobials: Anti-infectives (From admission, onward)   Start     Dose/Rate Route  Frequency Ordered Stop   07/21/19 0845  amoxicillin (AMOXIL) capsule 500 mg     500 mg Oral Every 8 hours 07/21/19 0837 07/25/19 0559   07/20/19 2200  amoxicillin (AMOXIL) capsule 500 mg  Status:  Discontinued     500 mg Oral 2 times daily 07/20/19 1801 07/21/19 0837      Subjective: Still with some bleeding, needing blood transfusion, however pt iniitally refused  Objective: Vitals:   07/23/19 1119 07/23/19 2125 07/24/19 0500 07/24/19 0528  BP: 101/73 102/79  104/75  Pulse: 82 75  79  Resp: 16 20  20   Temp: 98 F (36.7 C) 98.2 F (36.8 C)  98 F (36.7 C)  TempSrc: Oral     SpO2: 100% 100%  100%  Weight:   91.4 kg   Height:        Intake/Output Summary (Last 24 hours) at 07/24/2019 1553 Last data filed at 07/24/2019 1400 Gross per 24 hour  Intake 720 ml  Output 950 ml  Net -230 ml   Filed Weights   07/22/19 0540 07/23/19 0500 07/24/19 0500  Weight: 89.3 kg 91.3 kg 91.4 kg    Examination: General exam: Conversant, in no acute distress Respiratory system: normal chest rise, clear, no audible wheezing Cardiovascular system: regular rhythm, s1-s2 Gastrointestinal system: Nondistended, nontender, pos BS Central nervous system: No seizures, no tremors, blindness Extremities: No cyanosis, no joint deformities Skin: No rashes, no pallor Psychiatry: Affect normal // no auditory hallucinations   Data Reviewed: I have personally reviewed following labs and imaging studies  CBC: Recent Labs  Lab 07/20/19 1310 07/21/19 0329 07/22/19 0613 07/22/19 1224 07/23/19 0409 07/24/19 0928  WBC 5.6 5.7 6.3  --  5.6 6.0  NEUTROABS 3.9  --  4.2  --   --   --   HGB 8.3* 9.3* 8.8*  --  6.7* 8.0*  HCT 25.9* 29.4* 28.0*  --  20.8* 25.8*  MCV 94.9 95.1 97.6  --  97.2 97.0  PLT 126* 125* 149* 152 149* 623   Basic Metabolic Panel: Recent Labs  Lab 07/20/19 1310 07/21/19 0329 07/22/19 0613 07/24/19 0928  NA 133* 133* 135 136  K 4.3 4.3 4.4 3.5  CL 100 102 103 107  CO2 25 24  24 24   GLUCOSE 114* 96 85 108*  BUN 60* 55* 52* 24*  CREATININE 1.62* 1.51* 1.51* 1.14  CALCIUM 8.1* 8.3* 8.4* 7.4*  MG  --   --  2.2  --   PHOS  --   --  4.1  --    GFR: Estimated Creatinine Clearance: 69.7 mL/min (by C-G formula based on SCr of 1.14 mg/dL). Liver Function Tests: Recent Labs  Lab 07/20/19 1310 07/22/19 0613  AST 17  --   ALT 17  --   ALKPHOS 65  --   BILITOT 0.5  --   PROT 7.2  --   ALBUMIN 3.0* 3.1*   No results for input(s): LIPASE, AMYLASE in the last 168 hours. No results for input(s): AMMONIA in  the last 168 hours. Coagulation Profile: Recent Labs  Lab 07/22/19 1224  INR 1.2   Cardiac Enzymes: No results for input(s): CKTOTAL, CKMB, CKMBINDEX, TROPONINI in the last 168 hours. BNP (last 3 results) Recent Labs    11/26/18 1025  PROBNP 56.0   HbA1C: No results for input(s): HGBA1C in the last 72 hours. CBG: No results for input(s): GLUCAP in the last 168 hours. Lipid Profile: No results for input(s): CHOL, HDL, LDLCALC, TRIG, CHOLHDL, LDLDIRECT in the last 72 hours. Thyroid Function Tests: No results for input(s): TSH, T4TOTAL, FREET4, T3FREE, THYROIDAB in the last 72 hours. Anemia Panel: No results for input(s): VITAMINB12, FOLATE, FERRITIN, TIBC, IRON, RETICCTPCT in the last 72 hours. Sepsis Labs: No results for input(s): PROCALCITON, LATICACIDVEN in the last 168 hours.  Recent Results (from the past 240 hour(s))  SARS Coronavirus 2 (CEPHEID - Performed in [place] hospital lab), Hosp Order     Status: None   Collection Time: 07/20/19  1:58 PM   Specimen: Nasopharyngeal Swab  Result Value Ref Range Status   SARS Coronavirus 2 NEGATIVE NEGATIVE Final    Comment: (NOTE) If result is NEGATIVE SARS-CoV-2 target nucleic acids are NOT DETECTED. The SARS-CoV-2 RNA is generally detectable in upper and lower  respiratory specimens during the acute phase of infection. The lowest  concentration of SARS-CoV-2 viral copies this assay can  detect is 250  copies / mL. A negative result does not preclude SARS-CoV-2 infection  and should not be used as the sole basis for treatment or other  patient management decisions.  A negative result may occur with  improper specimen collection / handling, submission of specimen other  than nasopharyngeal swab, presence of viral mutation(s) within the  areas targeted by this assay, and inadequate number of viral copies  (<250 copies / mL). A negative result must be combined with clinical  observations, patient history, and epidemiological information. If result is POSITIVE SARS-CoV-2 target nucleic acids are DETECTED. The SARS-CoV-2 RNA is generally detectable in upper and lower  respiratory specimens dur ing the acute phase of infection.  Positive  results are indicative of active infection with SARS-CoV-2.  Clinical  correlation with patient history and other diagnostic information is  necessary to determine patient infection status.  Positive results do  not rule out bacterial infection or co-infection with other viruses. If result is PRESUMPTIVE POSTIVE SARS-CoV-2 nucleic acids MAY BE PRESENT.   A presumptive positive result was obtained on the submitted specimen  and confirmed on repeat testing.  While 2019 novel coronavirus  (SARS-CoV-2) nucleic acids may be present in the submitted sample  additional confirmatory testing may be necessary for epidemiological  and / or clinical management purposes  to differentiate between  SARS-CoV-2 and other Sarbecovirus currently known to infect humans.  If clinically indicated additional testing with an alternate test  methodology [phone]) is advised. The SARS-CoV-2 RNA is generally  detectable in upper and lower respiratory sp ecimens during the acute  phase of infection. The expected result is Negative. Fact Sheet for Patients:  StrictlyIdeas.no Fact Sheet for Healthcare Providers:  BankingDealers.co.za This test is not yet approved or cleared by the [place] FDA and has been authorized for detection and/or diagnosis of SARS-CoV-2 by FDA under an Emergency Use Authorization (EUA).  This EUA will remain in effect (meaning this test can be used) for the duration of the COVID-19 declaration under Section 564(b)(1) of the Act, 21 U.S.C. section 360bbb-3(b)(1), unless the authorization is terminated or revoked  sooner. Performed at [facility], [place] [address], [place], [place] [address]      Radiology Studies: No results found.  Scheduled Meds: . sodium chloride  250 mL Intravenous Once  . amiodarone  200 mg Oral Daily  . amoxicillin  500 mg Oral Q8H  . carvedilol  12.5 mg Oral BID WC  . divalproex  500 mg Oral Daily  . DULoxetine  30 mg Oral Daily  . feeding supplement (ENSURE ENLIVE)  237 mL Oral TID BM  . furosemide  40 mg Oral Daily  . levETIRAcetam  1,000 mg Oral BID  . phenytoin  100 mg Oral BID  . simvastatin  20 mg Oral q1800  . tamsulosin  0.4 mg Oral Daily   Continuous Infusions:   LOS: 3 days   Marylu Lund, MD [facility] Pager On Amion  If 7PM-7AM, please contact night-coverage 07/24/2019, 3:53 PM

## 2019-07-24 NOTE — Progress Notes (Signed)
[facility].   DOB:Jul 03, 1946   GE#:[id number]   YYT#:[id number]  Hematology follow up note   Subjective: Saw pt in the morning, he has persistent mouth bleeding, worse than yesterday, CBC still pending  Objective:  Vitals:   07/23/19 2125 07/24/19 0528  BP: 102/79 104/75  Pulse: 75 79  Resp: 20 20  Temp: 98.2 F (36.8 C) 98 F (36.7 C)  SpO2: 100% 100%    Body mass index is 28.1 kg/m.  Intake/Output Summary (Last 24 hours) at 07/24/2019 0909 Last data filed at 07/24/2019 0700 Gross per 24 hour  Intake 313.5 ml  Output 850 ml  Net -536.5 ml     Sclerae unicteric  Skin; no petechia or ecchymosis  Oral exam showed his gauze in mouth are saturated with blood and blood clots, mild bleeding at dental extraction sites and a few blood clots     CBG (last 3)  No results for input(s): GLUCAP in the last 72 hours.   Labs:  Lab Results  Component Value Date   WBC 5.6 07/23/2019   HGB 6.7 (LL) 07/23/2019   HCT 20.8 (L) 07/23/2019   MCV 97.2 07/23/2019   PLT 149 (L) 07/23/2019   NEUTROABS 4.2 07/22/2019   Urine Studies No results for input(s): UHGB, CRYS in the last 72 hours.  Invalid input(s): UACOL, UAPR, USPG, UPH, UTP, UGL, UKET, UBIL, UNIT, UROB, ULEU, UEPI, UWBC, URBC, UBAC, CAST, [place], [place]  Basic Metabolic Panel: Recent Labs  Lab 07/20/19 1310 07/21/19 0329 07/22/19 0613  NA 133* 133* 135  K 4.3 4.3 4.4  CL 100 102 103  CO2 25 24 24   GLUCOSE 114* 96 85  BUN 60* 55* 52*  CREATININE 1.62* 1.51* 1.51*  CALCIUM 8.1* 8.3* 8.4*  MG  --   --  2.2  PHOS  --   --  4.1   GFR Estimated Creatinine Clearance: 52.6 mL/min (A) (by C-G formula based on SCr of 1.51 mg/dL (H)). Liver Function Tests: Recent Labs  Lab 07/20/19 1310 07/22/19 0613  AST 17  --   ALT 17  --   ALKPHOS 65  --   BILITOT 0.5  --   PROT 7.2  --   ALBUMIN 3.0* 3.1*   No results for input(s): LIPASE, AMYLASE in the last 168 hours. No results for input(s): AMMONIA in the last 168  hours. Coagulation profile Recent Labs  Lab 07/22/19 1224  INR 1.2    CBC: Recent Labs  Lab 07/20/19 1310 07/21/19 0329 07/22/19 0613 07/22/19 1224 07/23/19 0409  WBC 5.6 5.7 6.3  --  5.6  NEUTROABS 3.9  --  4.2  --   --   HGB 8.3* 9.3* 8.8*  --  6.7*  HCT 25.9* 29.4* 28.0*  --  20.8*  MCV 94.9 95.1 97.6  --  97.2  PLT 126* 125* 149* 152 149*   Cardiac Enzymes: No results for input(s): CKTOTAL, CKMB, CKMBINDEX, TROPONINI in the last 168 hours. BNP: Invalid input(s): POCBNP CBG: No results for input(s): GLUCAP in the last 168 hours. D-Dimer Recent Labs    07/22/19 1224  DDIMER 0.57*   Hgb A1c No results for input(s): HGBA1C in the last 72 hours. Lipid Profile No results for input(s): CHOL, HDL, LDLCALC, TRIG, CHOLHDL, LDLDIRECT in the last 72 hours. Thyroid function studies No results for input(s): TSH, T4TOTAL, T3FREE, THYROIDAB in the last 72 hours.  Invalid input(s): FREET3 Anemia work up No results for input(s): VITAMINB12, FOLATE, FERRITIN, TIBC, IRON, RETICCTPCT in  the last 72 hours. Microbiology Recent Results (from the past 240 hour(s))  SARS Coronavirus 2 (CEPHEID - Performed in [place] hospital lab), Hosp Order     Status: None   Collection Time: 07/20/19  1:58 PM   Specimen: Nasopharyngeal Swab  Result Value Ref Range Status   SARS Coronavirus 2 NEGATIVE NEGATIVE Final    Comment: (NOTE) If result is NEGATIVE SARS-CoV-2 target nucleic acids are NOT DETECTED. The SARS-CoV-2 RNA is generally detectable in upper and lower  respiratory specimens during the acute phase of infection. The lowest  concentration of SARS-CoV-2 viral copies this assay can detect is 250  copies / mL. A negative result does not preclude SARS-CoV-2 infection  and should not be used as the sole basis for treatment or other  patient management decisions.  A negative result may occur with  improper specimen collection / handling, submission of specimen other  than  nasopharyngeal swab, presence of viral mutation(s) within the  areas targeted by this assay, and inadequate number of viral copies  (<250 copies / mL). A negative result must be combined with clinical  observations, patient history, and epidemiological information. If result is POSITIVE SARS-CoV-2 target nucleic acids are DETECTED. The SARS-CoV-2 RNA is generally detectable in upper and lower  respiratory specimens dur ing the acute phase of infection.  Positive  results are indicative of active infection with SARS-CoV-2.  Clinical  correlation with patient history and other diagnostic information is  necessary to determine patient infection status.  Positive results do  not rule out bacterial infection or co-infection with other viruses. If result is PRESUMPTIVE POSTIVE SARS-CoV-2 nucleic acids MAY BE PRESENT.   A presumptive positive result was obtained on the submitted specimen  and confirmed on repeat testing.  While 2019 novel coronavirus  (SARS-CoV-2) nucleic acids may be present in the submitted sample  additional confirmatory testing may be necessary for epidemiological  and / or clinical management purposes  to differentiate between  SARS-CoV-2 and other Sarbecovirus currently known to infect humans.  If clinically indicated additional testing with an alternate test  methodology [phone]) is advised. The SARS-CoV-2 RNA is generally  detectable in upper and lower respiratory sp ecimens during the acute  phase of infection. The expected result is Negative. Fact Sheet for Patients:  StrictlyIdeas.no Fact Sheet for Healthcare Providers: BankingDealers.co.za This test is not yet approved or cleared by the [place] FDA and has been authorized for detection and/or diagnosis of SARS-CoV-2 by FDA under an Emergency Use Authorization (EUA).  This EUA will remain in effect (meaning this test can be used) for the duration of  the COVID-19 declaration under Section 564(b)(1) of the Act, 21 U.S.C. section 360bbb-3(b)(1), unless the authorization is terminated or revoked sooner. Performed at [facility], [place] [address], [place], [place] [address]       Studies:  No results found.  Assessment: 73 y.o.   1.  Postop bleeding from extensive dental extraction 2.  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 Anemia of blood loss  4. CHF with AICD 5. AF 6. Seizure disorder  7. CKD    Plan:  -will give one dose vitK 20mg  today, ordered -will change amicar mouth wash to swish and swallow q1h for today  -I discussed with Dr. Irene Limbo, due to his risk of thrombosis from factor VII replacement, and the mild degree of his bleeding, will hold on factor VII replacement  -I will discuss with Dr.  [name] or Kulinski to see if they can suture or pack his dental wounds for local bleeding control    Truitt Merle, MD 07/23/2019

## 2019-07-24 NOTE — Progress Notes (Signed)
[facility].   DOB:1946/06/21   DG#:[id number]   RJJ#:[id number]  Hematology follow up note   Subjective: I saw pt in late afternoon. He reported more mouth bleeding last night, but slowed down during the day. He received blood transfusion today due to hg down to 6.7.   Objective:  Vitals:   07/23/19 1119 07/23/19 2125  BP: 101/73 102/79  Pulse: 82 75  Resp: 16 20  Temp: 98 F (36.7 C) 98.2 F (36.8 C)  SpO2: 100% 100%    Body mass index is 28.09 kg/m.  Intake/Output Summary (Last 24 hours) at 07/24/2019 0009 Last data filed at 07/23/2019 1621 Gross per 24 hour  Intake 989.5 ml  Output 1150 ml  Net -160.5 ml     Sclerae unicteric  Skin; no petechia or ecchymosis  Oral exam showed no active bleeding, but has minimal old blood in dental extraction sites and a few blood clots     CBG (last 3)  No results for input(s): GLUCAP in the last 72 hours.   Labs:  Lab Results  Component Value Date   WBC 5.6 07/23/2019   HGB 6.7 (LL) 07/23/2019   HCT 20.8 (L) 07/23/2019   MCV 97.2 07/23/2019   PLT 149 (L) 07/23/2019   NEUTROABS 4.2 07/22/2019   Urine Studies No results for input(s): UHGB, CRYS in the last 72 hours.  Invalid input(s): UACOL, UAPR, USPG, UPH, UTP, UGL, UKET, UBIL, UNIT, UROB, ULEU, UEPI, UWBC, URBC, UBAC, CAST, [place], [place]  Basic Metabolic Panel: Recent Labs  Lab 07/20/19 1310 07/21/19 0329 07/22/19 0613  NA 133* 133* 135  K 4.3 4.3 4.4  CL 100 102 103  CO2 25 24 24   GLUCOSE 114* 96 85  BUN 60* 55* 52*  CREATININE 1.62* 1.51* 1.51*  CALCIUM 8.1* 8.3* 8.4*  MG  --   --  2.2  PHOS  --   --  4.1   GFR Estimated Creatinine Clearance: 52.6 mL/min (A) (by C-G formula based on SCr of 1.51 mg/dL (H)). Liver Function Tests: Recent Labs  Lab 07/20/19 1310 07/22/19 0613  AST 17  --   ALT 17  --   ALKPHOS 65  --   BILITOT 0.5  --   PROT 7.2  --   ALBUMIN 3.0* 3.1*   No results for input(s): LIPASE, AMYLASE in the last 168 hours. No results for  input(s): AMMONIA in the last 168 hours. Coagulation profile Recent Labs  Lab 07/22/19 1224  INR 1.2    CBC: Recent Labs  Lab 07/20/19 1310 07/21/19 0329 07/22/19 0613 07/22/19 1224 07/23/19 0409  WBC 5.6 5.7 6.3  --  5.6  NEUTROABS 3.9  --  4.2  --   --   HGB 8.3* 9.3* 8.8*  --  6.7*  HCT 25.9* 29.4* 28.0*  --  20.8*  MCV 94.9 95.1 97.6  --  97.2  PLT 126* 125* 149* 152 149*   Cardiac Enzymes: No results for input(s): CKTOTAL, CKMB, CKMBINDEX, TROPONINI in the last 168 hours. BNP: Invalid input(s): POCBNP CBG: No results for input(s): GLUCAP in the last 168 hours. D-Dimer Recent Labs    07/22/19 1224  DDIMER 0.57*   Hgb A1c No results for input(s): HGBA1C in the last 72 hours. Lipid Profile No results for input(s): CHOL, HDL, LDLCALC, TRIG, CHOLHDL, LDLDIRECT in the last 72 hours. Thyroid function studies No results for input(s): TSH, T4TOTAL, T3FREE, THYROIDAB in the last 72 hours.  Invalid input(s): FREET3 Anemia work up No  results for input(s): VITAMINB12, FOLATE, FERRITIN, TIBC, IRON, RETICCTPCT in the last 72 hours. Microbiology Recent Results (from the past 240 hour(s))  SARS Coronavirus 2 (CEPHEID - Performed in [place] hospital lab), Hosp Order     Status: None   Collection Time: 07/20/19  1:58 PM   Specimen: Nasopharyngeal Swab  Result Value Ref Range Status   SARS Coronavirus 2 NEGATIVE NEGATIVE Final    Comment: (NOTE) If result is NEGATIVE SARS-CoV-2 target nucleic acids are NOT DETECTED. The SARS-CoV-2 RNA is generally detectable in upper and lower  respiratory specimens during the acute phase of infection. The lowest  concentration of SARS-CoV-2 viral copies this assay can detect is 250  copies / mL. A negative result does not preclude SARS-CoV-2 infection  and should not be used as the sole basis for treatment or other  patient management decisions.  A negative result may occur with  improper specimen collection / handling, submission  of specimen other  than nasopharyngeal swab, presence of viral mutation(s) within the  areas targeted by this assay, and inadequate number of viral copies  (<250 copies / mL). A negative result must be combined with clinical  observations, patient history, and epidemiological information. If result is POSITIVE SARS-CoV-2 target nucleic acids are DETECTED. The SARS-CoV-2 RNA is generally detectable in upper and lower  respiratory specimens dur ing the acute phase of infection.  Positive  results are indicative of active infection with SARS-CoV-2.  Clinical  correlation with patient history and other diagnostic information is  necessary to determine patient infection status.  Positive results do  not rule out bacterial infection or co-infection with other viruses. If result is PRESUMPTIVE POSTIVE SARS-CoV-2 nucleic acids MAY BE PRESENT.   A presumptive positive result was obtained on the submitted specimen  and confirmed on repeat testing.  While 2019 novel coronavirus  (SARS-CoV-2) nucleic acids may be present in the submitted sample  additional confirmatory testing may be necessary for epidemiological  and / or clinical management purposes  to differentiate between  SARS-CoV-2 and other Sarbecovirus currently known to infect humans.  If clinically indicated additional testing with an alternate test  methodology [phone]) is advised. The SARS-CoV-2 RNA is generally  detectable in upper and lower respiratory sp ecimens during the acute  phase of infection. The expected result is Negative. Fact Sheet for Patients:  StrictlyIdeas.no Fact Sheet for Healthcare Providers: BankingDealers.co.za This test is not yet approved or cleared by the [place] FDA and has been authorized for detection and/or diagnosis of SARS-CoV-2 by FDA under an Emergency Use Authorization (EUA).  This EUA will remain in effect (meaning this test can be used) for  the duration of the COVID-19 declaration under Section 564(b)(1) of the Act, 21 U.S.C. section 360bbb-3(b)(1), unless the authorization is terminated or revoked sooner. Performed at [facility], [place] [address] [place], [place] [address]       Studies:  No results found.  Assessment: 73 y.o.   1.  Postop bleeding from extensive dental extraction 2.  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 Anemia of blood loss  4. CHF with AICD 5. AF 6. Seizure disorder  7. CKD    Plan:  -He did not respond to FFP yesterday, bled bit more last night, but slowed down again during the day -will check him again in the morning,if he has persistent bleeding, will consider recombinant factor VII tomorrow. We usually use it for severe bleeding, due to  it's risk of thrombosis and high cost. But due to his persistent postop bleeding, it's also reasonable to consider using it -will see him in the morning and f/u his factor VII level which is still pending    Truitt Merle, MD 07/23/2019

## 2019-07-24 NOTE — TOC Initial Note (Signed)
Transition of Care (TOC) - Initial/Assessment Note    Patient Details  Name: Louis Ford. MRN: [id number] Date of Birth: 13-Apr-1946  Transition of Care [facility]) CM/SW Contact:    Nila Nephew, LCSW Phone Number: [phone] 07/24/2019, 1:27 PM  Clinical Narrative:   Pt admitted with anemia- acute blood loss, oral bleeding from recent dental procedure. SNF recommended per therapy- CSW discussed with pt's spouse. Initially met pt and spouse 12/2017 when pt hospitalized at [facility], attempted to have pt admit to SNF at that time but did not get insurance authorization. Wife emphasized that insurance covering SNF would be deciding factor in his DC plan. Requested CSW make referrals- completed FL2, referrals, and will need following up with bed offers. Once facility selected, facility can initiate insurance auth request.  Wife states pt has been mostly independent with mobility and ADLs at home, using cane and RW when "he is having a weak day." Would want goal of short term rehab to be to return closer to independence.                    Patient Goals and CMS Choice    see above for goals     Expected Discharge Plan and Services           Expected Discharge Date: (unknown)                                    Prior Living Arrangements/Services      Home- self care                 Activities of Daily Living Home Assistive Devices/Equipment: Cane (specify quad or straight), Eyeglasses, Walker (specify type)(single point cane, front wheeled walker) ADL Screening (condition at time of admission) Patient's cognitive ability adequate to safely complete daily activities?: Yes Is the patient deaf or have difficulty hearing?: No Does the patient have difficulty seeing, even when wearing glasses/contacts?: No Does the patient have difficulty concentrating, remembering, or making decisions?: No Patient able to express need for assistance with ADLs?: Yes Does the patient have  difficulty dressing or bathing?: No Independently performs ADLs?: Yes (appropriate for developmental age) Does the patient have difficulty walking or climbing stairs?: Yes(secondary to weakness) Weakness of Legs: Both Weakness of Arms/Hands: None  Permission Sought/Granted                  Emotional Assessment              Admission diagnosis:  Anemia, unspecified type [D64.9] Patient Active Problem List   Diagnosis Date Noted  . Anemia due to blood loss, acute 07/20/2019  . Retroperitoneal bleed 04/21/2019  . Retroperitoneal hematoma   . Right arm pain 11/26/2018  . Hematoma 03/26/2018  . Leg pain 03/04/2018  . 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 Elevated TSH 10/27/2017  . Weakness of both lower extremities 10/27/2017  . Unsteady gait 10/27/2017  . Localization-related symptomatic epilepsy and epileptic syndromes with complex partial seizures, not intractable, without status epilepticus ([place]) 10/08/2017  . Insomnia 09/02/2017  . Degenerative joint disease of knee, left 08/01/2017  . Blood loss anemia 04/18/2017  . Hx of adenomatous colonic polyps 02/20/2017  . Chronic pain of left knee 01/22/2017  . Chronic kidney disease (CKD), stage III (moderate) ([place]) 11/08/2016  . Hypertensive heart disease without heart failure   . Chronic chest wall pain 08/17/2016  . Periodontal  disease 05/18/2016  . HLD (hyperlipidemia) 02/26/2016  . CVA (cerebral infarction) 02/26/2016  . Near syncope 02/26/2016  . Gout 12/30/2015  . Persistent atrial fibrillation with rapid ventricular response 04/21/2014  . Chronic systolic CHF (congestive heart failure), NYHA class 2 ([place]) 11/06/2012  . 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 Seizure ([place]) 10/01/2012  . Renal insufficiency 10/01/2012  . Ischemic cardiomyopathy  ? additional rate component 09/29/2012  . Automatic implantable cardioverter-defibrillator in situ   . HTN (hypertension) 09/21/2012  . Coronary artery disease  involving native heart 09/21/2012   PCP:  [name], MD Pharmacy:   [facility] [address]), [place] [address] [place] ([place]) [place] [address] Phone: [phone] Fax: [phone]  [place] [address] Phone: [phone] Fax: [phone]     Social Determinants of Health (SDOH) Interventions    Readmission Risk Interventions No flowsheet data found.

## 2019-07-25 ENCOUNTER — Inpatient Hospital Stay (HOSPITAL_COMMUNITY): Payer: Medicare Other

## 2019-07-25 ENCOUNTER — Inpatient Hospital Stay: Payer: Self-pay

## 2019-07-25 DIAGNOSIS — K068 Other specified disorders of gingiva and edentulous alveolar ridge: Secondary | ICD-10-CM | POA: Diagnosis not present

## 2019-07-25 LAB — BASIC METABOLIC PANEL
Anion gap: 7 (ref 5–15)
Anion gap: 8 (ref 5–15)
BUN: 24 mg/dL — ABNORMAL HIGH (ref 8–23)
BUN: 26 mg/dL — ABNORMAL HIGH (ref 8–23)
CO2: 26 mmol/L (ref 22–32)
CO2: 21 mmol/L — ABNORMAL LOW (ref 22–32)
Calcium: 7.9 mg/dL — ABNORMAL LOW (ref 8.9–10.3)
Calcium: 7.7 mg/dL — ABNORMAL LOW (ref 8.9–10.3)
Chloride: 102 mmol/L (ref 98–111)
Chloride: 103 mmol/L (ref 98–111)
Creatinine, Ser: 1.25 mg/dL — ABNORMAL HIGH (ref 0.61–1.24)
Creatinine, Ser: 1.56 mg/dL — ABNORMAL HIGH (ref 0.61–1.24)
GFR calc Af Amer: >60 mL/min (ref >60)
GFR calc Af Amer: 51 mL/min — ABNORMAL LOW (ref >60)
GFR calc non Af Amer: 58 mL/min — ABNORMAL LOW (ref >60)
GFR calc non Af Amer: 44 mL/min — ABNORMAL LOW (ref >60)
Glucose, Bld: 84 mg/dL (ref 70–99)
Glucose, Bld: 388 mg/dL — ABNORMAL HIGH (ref 70–99)
Potassium: 4.3 mmol/L (ref 3.5–5.1)
Potassium: 4.7 mmol/L (ref 3.5–5.1)
Sodium: 135 mmol/L (ref 135–145)
Sodium: 132 mmol/L — ABNORMAL LOW (ref 135–145)

## 2019-07-25 LAB — CBC
HCT: 23.3 % — ABNORMAL LOW (ref 39.0–52.0)
HCT: 20.7 % — ABNORMAL LOW (ref 39.0–52.0)
Hemoglobin: 7.3 g/dL — ABNORMAL LOW (ref 13.0–17.0)
Hemoglobin: 6.5 g/dL — CL (ref 13.0–17.0)
MCH: 30.4 pg (ref 26.0–34.0)
MCH: 30.8 pg (ref 26.0–34.0)
MCHC: 31.3 g/dL (ref 30.0–36.0)
MCHC: 31.4 g/dL (ref 30.0–36.0)
MCV: 97.1 fL (ref 80.0–100.0)
MCV: 98.1 fL (ref 80.0–100.0)
Platelets: 202 10*3/uL (ref 150–400)
Platelets: 194 10*3/uL (ref 150–400)
RBC: 2.4 MIL/uL — ABNORMAL LOW (ref 4.22–5.81)
RBC: 2.11 MIL/uL — ABNORMAL LOW (ref 4.22–5.81)
RDW: 15 % (ref 11.5–15.5)
RDW: 15.1 % (ref 11.5–15.5)
WBC: 5.8 10*3/uL (ref 4.0–10.5)
WBC: 4.7 10*3/uL (ref 4.0–10.5)
nRBC: 1 % — ABNORMAL HIGH (ref 0.0–0.2)
nRBC: 1.1 % — ABNORMAL HIGH (ref 0.0–0.2)

## 2019-07-25 LAB — GLUCOSE, CAPILLARY
Glucose-Capillary: 47 mg/dL — ABNORMAL LOW (ref 70–99)
Glucose-Capillary: 48 mg/dL — ABNORMAL LOW (ref 70–99)
Glucose-Capillary: 50 mg/dL — ABNORMAL LOW (ref 70–99)
Glucose-Capillary: 73 mg/dL (ref 70–99)
Glucose-Capillary: 227 mg/dL — ABNORMAL HIGH (ref 70–99)
Glucose-Capillary: 30 mg/dL — CL (ref 70–99)
Glucose-Capillary: 102 mg/dL — ABNORMAL HIGH (ref 70–99)
Glucose-Capillary: 36 mg/dL — CL (ref 70–99)

## 2019-07-25 LAB — PREPARE RBC (CROSSMATCH)

## 2019-07-25 LAB — HEMOGLOBIN AND HEMATOCRIT, BLOOD
HCT: 24.5 % — ABNORMAL LOW (ref 39.0–52.0)
Hemoglobin: 7.9 g/dL — ABNORMAL LOW (ref 13.0–17.0)

## 2019-07-25 MED ORDER — AMINOCAPROIC ACID SOLUTION 5% (50 MG/ML)
10.0000 mL | ORAL | Status: DC | PRN
  Administered 2019-07-26 – 2019-08-16 (×7): 10 mL via ORAL
  Filled 2019-07-25 (×4): qty 100

## 2019-07-25 MED ORDER — DEXTROSE-NACL 5-0.9 % IV SOLN: INTRAVENOUS | Status: DC

## 2019-07-25 MED ORDER — LACTATED RINGERS IV SOLN
INTRAVENOUS | Status: DC
  Administered 2019-07-25: 12:00:00 via INTRAVENOUS

## 2019-07-25 MED ORDER — DEXTROSE 50 % IV SOLN
INTRAVENOUS | Status: AC
  Administered 2019-07-25: 10:00:00
  Filled 2019-07-25: qty 50

## 2019-07-25 MED ORDER — INSULIN ASPART 100 UNIT/ML ~~LOC~~ SOLN
0.0000 [IU] | SUBCUTANEOUS | Status: DC
  Administered 2019-07-27: 1 [IU] via SUBCUTANEOUS

## 2019-07-25 MED ORDER — DEXTROSE 50 % IV SOLN
1.0000 | Freq: Once | INTRAVENOUS | Status: AC
  Administered 2019-07-25 – 2019-07-26 (×2): 50 mL via INTRAVENOUS

## 2019-07-25 MED ORDER — SODIUM CHLORIDE 0.9% IV SOLUTION
Freq: Once | INTRAVENOUS | Status: AC
  Administered 2019-07-25: 18:00:00 via INTRAVENOUS

## 2019-07-25 NOTE — Progress Notes (Signed)
PROGRESS NOTE    [facility]  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 Patient is poor historian. Wife could not pick up the phone. I reviewed his previous admissions. According to the patient, he has not been on blood thinners since he had that retroperitoneal bleed about 2 months ago. He had been otherwise doing well. He walks with a walker. Also has a history of seizure and is on multiple medications. He went to dentist and had almost all of his teeth removed on 07/08/2019, following that he had continued bleeding into his mouth and they had talked to their dentist who recommended to control with a gauze piece. His wife has been trying to feed him with Gatorade and chicken soup but otherwise he has not been able to eat much. Patient states that 2 days ago he fell and hit his head had some pain but improved now. The reason he came to the hospital today is feeling dizzy and blood pressure dropping on ambulation.   Assessment & Plan:   Principal Problem:   Anemia due to blood loss, acute Active Problems:   HTN (hypertension)   Coronary artery disease involving native heart   Automatic implantable cardioverter-defibrillator in situ   Seizure (HCC)   Renal insufficiency   Factor VII deficiency (HCC)   Chronic systolic CHF (congestive heart failure), NYHA class 2 (HCC)   Chronic kidney disease (CKD), stage III (moderate) (HCC)  Anemia due to acute blood loss, symptomatic anemia:      - s/p 1 unit pRBCs d/t symptomatic anemia; appropriate response (Hgb 8.3 - 9.3)     - oral bleeding from recent dental procedure (dental team consulted and is following)     - Increased bleeding recently, followed by dental service and oncology, appreciate input. Now s/p second round of 10 doses of amicar.  - Given FFP infusion per Oncology and 1 unit PRBC on 7/30 with post-transfusion hgb of 8.0 - Followed by Dr. Enrique Sack, recommendation for eval by oral surgeon, Dr. [name] - Significant bleeding noted by nursing this AM with rapid response called. STAT CBC demonstrated a hgb of 6.5. One unit PRBC's ordered. Pt also noted to be hypotensive with sbp in the 80's, given aggressive IVF. BP since improved - Appreciate input by Dr. [name]. Plan for suturing of oral incisions  Dizziness/lightheadedness     - multifactorial; in part d/t above     - near syncope this AM per above, improved with IVF hydration  Chronic congestive heart failure status post AICD:      - d/t hypotension, continued rate controlling medication including amiodarone and beta-blockers, but stopped Aldactone and nitrates as well as hydralazine     - seems stable at this time  Chronic atrial fibrillation     - Rate controlled on amiodarone and Coreg.  Unable to anticoagulate. - remains stable at this time  History of seizure disorder:      - Unlikely recurrent seizure.       - Continued on multiple medications.       - Valproic acid level initially noted to  be low, cont current regimen as tolerated  Chronic kidney disease stage III due to hypertension:      - At about his baseline; will cont to monitor    - Repeat CBC in AM  Dementia -Pt more agitated this AM and initially refused blood transfusion -Discussed with pt's wife who states she makes medical decisions for pt -Updated wife on phone - Pt seems stable   DVT prophylaxis: SCD's Code Status: Full Family Communication: Pt in room, family not in room  Disposition Plan: Possible d/c home when no longer bleeding  Consultants:   Dental service  Hematology  Oral surgeon  Procedures:     Antimicrobials: Anti-infectives (From admission, onward)   Start     Dose/Rate Route Frequency Ordered Stop   07/21/19 0845  amoxicillin (AMOXIL) capsule 500 mg     500 mg Oral Every 8 hours 07/21/19 0837 07/24/19 2203   07/20/19 2200  amoxicillin (AMOXIL) capsule 500 mg  Status:  Discontinued     500 mg Oral 2 times daily 07/20/19 1801 07/21/19 0837      Subjective: Near syncopal episode following increased oral bleeding, resulting in rapid response  Objective: Vitals:   07/25/19 1424 07/25/19 1429 07/25/19 1453 07/25/19 1708  BP:  (!) 81/65 98/65 98/60   Pulse: 71  93 88  Resp:      Temp: (!) 97.5 F (36.4 C)  (!) 97.5 F (36.4 C) 97.8 F (36.6 C)  TempSrc: Axillary     SpO2: 95%  (!) 85% 100%  Weight:      Height:        Intake/Output Summary (Last 24 hours) at 07/25/2019 1726 Last data filed at 07/25/2019 1436 Gross per 24 hour  Intake 1454.24 ml  Output 600 ml  Net 854.24 ml   Filed Weights   07/23/19 0500 07/24/19 0500 07/25/19 7782  Weight: 91.3 kg 91.4 kg 91.3 kg    Examination: General exam: Conversant, in no acute distress Respiratory system: normal chest rise, clear, no audible wheezing Cardiovascular system: regular rhythm, s1-s2 Gastrointestinal system: Nondistended, nontender, pos BS Central nervous system: No seizures, no tremors Extremities: No cyanosis, no joint deformities Skin: No rashes, no pallor Psychiatry: Affect normal // no auditory hallucinations   Data Reviewed: I have personally reviewed following labs and imaging studies  CBC: Recent Labs  Lab 07/20/19 1310  07/22/19 0613 07/22/19 1224 07/23/19 0409 07/24/19 0928 07/25/19 0440 07/25/19 1014  WBC 5.6   < > 6.3  --  5.6 6.0 5.8 4.7  NEUTROABS 3.9  --  4.2  --   --   --   --   --   HGB 8.3*   < > 8.8*  --  6.7* 8.0* 7.3* 6.5*  HCT  25.9*   < > 28.0*  --  20.8* 25.8* 23.3* 20.7*  MCV 94.9   < > 97.6  --  97.2 97.0 97.1 98.1  PLT 126*   < > 149* 152 149* 191 202 194   < > = values in this interval not displayed.   Basic Metabolic Panel: Recent Labs  Lab 07/21/19 0329 07/22/19 0613 07/24/19 0928 07/25/19 0440 07/25/19 1014  NA 133* 135 136 135 132*  K 4.3 4.4 3.5 4.3 4.7  CL 102 103 107 102 103  CO2 24 24 24 26  21*  GLUCOSE 96 85 108* 84 388*  BUN 55* 52* 24* 24* 26*  CREATININE 1.51* 1.51* 1.14 1.25* 1.56*  CALCIUM 8.3* 8.4* 7.4* 7.9* 7.7*  MG  --  2.2  --   --   --   PHOS  --  4.1  --   --   --    GFR: Estimated Creatinine Clearance: 50.9 mL/min (A) (by C-G formula based on SCr of 1.56 mg/dL (H)). Liver Function Tests: Recent Labs  Lab 07/20/19 1310 07/22/19 0613  AST 17  --   ALT 17  --   ALKPHOS 65  --   BILITOT 0.5  --   PROT 7.2  --   ALBUMIN 3.0* 3.1*   No results for input(s): LIPASE, AMYLASE in the last 168 hours. No results for input(s): AMMONIA in the last 168 hours. Coagulation Profile: Recent Labs  Lab 07/22/19 1224  INR 1.2   Cardiac Enzymes: No results for input(s): CKTOTAL, CKMB, CKMBINDEX, TROPONINI in the last 168 hours. BNP (last 3 results) Recent Labs    11/26/18 1025  PROBNP 56.0   HbA1C: No results for input(s): HGBA1C in the last 72 hours. CBG: Recent Labs  Lab 07/25/19 0938 07/25/19 0948 07/25/19 1002 07/25/19 1023 07/25/19 1315  GLUCAP 48* 50* 73 47* 227*   Lipid Profile: No results for input(s): CHOL, HDL, LDLCALC, TRIG, CHOLHDL, LDLDIRECT in the last 72 hours. Thyroid Function Tests: No results for input(s): TSH, T4TOTAL, FREET4, T3FREE, THYROIDAB in the last 72 hours. Anemia Panel: No results for input(s): VITAMINB12, FOLATE, FERRITIN, TIBC, IRON, RETICCTPCT in the last 72 hours. Sepsis Labs: No results for input(s): PROCALCITON, LATICACIDVEN in the last 168 hours.  Recent Results (from the past 240 hour(s))  SARS Coronavirus 2 (CEPHEID -  Performed in [place] hospital lab), Hosp Order     Status: None   Collection Time: 07/20/19  1:58 PM   Specimen: Nasopharyngeal Swab  Result Value Ref Range Status   SARS Coronavirus 2 NEGATIVE NEGATIVE Final    Comment: (NOTE) If result is NEGATIVE SARS-CoV-2 target nucleic acids are NOT DETECTED. The SARS-CoV-2 RNA is generally detectable in upper and lower  respiratory specimens during the acute phase of infection. The lowest  concentration of SARS-CoV-2 viral copies this assay can detect is 250  copies / mL. A negative result does not preclude SARS-CoV-2 infection  and should not be used as the sole basis for treatment or other  patient management decisions.  A negative result may occur with  improper specimen collection / handling, submission of specimen other  than nasopharyngeal swab, presence of viral mutation(s) within the  areas targeted by this assay, and inadequate number of viral copies  (<250 copies / mL). A negative result must be combined with clinical  observations, patient history, and epidemiological information. If result is POSITIVE SARS-CoV-2 target nucleic acids are DETECTED. The SARS-CoV-2 RNA is generally detectable in upper and lower  respiratory specimens dur ing the acute phase of infection.  Positive  results are indicative of active infection with SARS-CoV-2.  Clinical  correlation with patient history and other diagnostic information is  necessary to determine patient infection status.  Positive results do  not rule out bacterial infection or co-infection with other viruses. If result is PRESUMPTIVE POSTIVE SARS-CoV-2 nucleic acids MAY BE PRESENT.   A presumptive positive result was obtained on the submitted specimen  and confirmed on repeat testing.  While 2019 novel coronavirus  (SARS-CoV-2) nucleic acids may be present in the submitted sample  additional confirmatory testing may be necessary for epidemiological  and / or clinical management  purposes  to differentiate between  SARS-CoV-2 and other Sarbecovirus currently  known to infect humans.  If clinically indicated additional testing with an alternate test  methodology [phone]) is advised. The SARS-CoV-2 RNA is generally  detectable in upper and lower respiratory sp ecimens during the acute  phase of infection. The expected result is Negative. Fact Sheet for Patients:  StrictlyIdeas.no Fact Sheet for Healthcare Providers: BankingDealers.co.za This test is not yet approved or cleared by the [place] FDA and has been authorized for detection and/or diagnosis of SARS-CoV-2 by FDA under an Emergency Use Authorization (EUA).  This EUA will remain in effect (meaning this test can be used) for the duration of the COVID-19 declaration under Section 564(b)(1) of the Act, 21 U.S.C. section 360bbb-3(b)(1), unless the authorization is terminated or revoked sooner. Performed at [facility], [place] [address] [place],  [address]      Radiology Studies: Dg Chest Port 1 View  Result Date: 07/25/2019 CLINICAL DATA:  Patient became unresponsive with exertion. EXAM: PORTABLE CHEST 1 VIEW COMPARISON:  Chest CT, 04/21/2019.  Chest radiographs, 12/07/2019. FINDINGS: There stable changes from prior cardiac surgery. Cardiac silhouette is mildly enlarged. No mediastinal or hilar masses. Lungs are clear.  No pleural effusion or pneumothorax. Right anterior chest wall single lead pacemaker is stable. Skeletal structures are grossly intact. IMPRESSION: No acute cardiopulmonary disease. Electronically Signed   By: Lajean Manes M.D.   On: 07/25/2019 16:57   [place] Ekg Site Rite  Result Date: 07/25/2019 If Site Rite image not attached, placement could not be confirmed due to current cardiac rhythm.   Scheduled Meds: . sodium chloride  250 mL Intravenous Once  . sodium chloride   Intravenous Once  . amiodarone  200 mg Oral  Daily  . divalproex  500 mg Oral Daily  . DULoxetine  30 mg Oral Daily  . feeding supplement (ENSURE ENLIVE)  237 mL Oral TID BM  . levETIRAcetam  1,000 mg Oral BID  . phenytoin  100 mg Oral BID  . simvastatin  20 mg Oral q1800  . tamsulosin  0.4 mg Oral Daily   Continuous Infusions:   LOS: 4 days   Marylu Lund, MD [facility] Pager On Amion  If 7PM-7AM, please contact night-coverage 07/25/2019, 5:26 PM

## 2019-07-25 NOTE — Progress Notes (Signed)
Hypoglycemic Event  CBG: 30  Treatment: 15 oz OJ given  Symptoms: Alert, sweaty, no other symptoms  Follow-up CBG: Time:2030 CBG Result:36  Possible Reasons for Event: UNKNOWN, 2ND OCCURRENCE OF HYPOGLYCEMIA EVENT IN THE LAST 24 HOURS  Comments/MD notified:  [place]    Jorene Minors

## 2019-07-25 NOTE — Significant Event (Signed)
Rapid Response Event Note  Overview:      Initial Focused Assessment: Pt diaphoretic head between knees being held up by nursing staff on toilet. Blood falling from mouth to floor. Pt with periods of  Seizure like activity, shaking and unresponsiveness to yelling leave me alone.   Interventions: Pt picked up from toilet and placed in wheel chair, then picked up from wheel chair and placed in the bed. Monitors applied. BP soft 82/46 (58)- 1L NS bolus initiated CBG @ 0938 48 1 AMP D50% Given CBG @0948  50 1 Amp D50% Given, MD ordered D5 @ 115mL/hr CBG @  1002 73  CBG @ 1023 47 1 AMP Given-- Lab glucose 388- does not validate CBG's. Hgb 6.5. MD will transfuse 1 unit of blood. Secondary access obtained.   Plan of Care (if not transferred): Pt remains in 1519 and to receive one unit of blood. BP stabilized and blood glucose stabilized. Pt alert and able to eat and drink. Bedside RN instructed to call Rapid Response with any concerns. Rapid will round on patient later in afternoon. 1300: Pt resting comfortably in bed. Alert and oriented. Appreciative of help earlier. Blood transfusion had not been initiated yet. RN instructed to call with any concerns.        Wray Kearns, RN

## 2019-07-25 NOTE — Consult Note (Signed)
Reason for Consult: Persistent oral bleeding Referring Physician: Truitt Merle, MD  Louis Ford. is an 73 y.o. male who underwent dental extractions 07/16/2019 by general dentist in [place]. Patient has persistent post-op bleeding. Presented to ER 07/20/2019 and admitted. Has had Amicar 5% oral rinses, PRBC, FFP transfusions. Spoke with Dr. Burr Medico this am who requests surgical intervention to reclose incisions before resorting to  Recombinant Factor VII due to thrombosis risk.    Past Medical History:  Diagnosis Date  . AICD (automatic cardioverter/defibrillator) present   . Atrial fibrillation   09/22/2012  . Blood loss anemia 04/18/2017   After GI bleed from colonoscopy and polypectomy  . CAD (coronary artery disease)   . Chronic systolic heart failure ([place])   . 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 GIB (gastrointestinal bleeding) 02/20/2017  . History of MRSA infection 05/2011  . HTN (hypertension)   . Hx of adenomatous colonic polyps 02/20/2017   01/2017 - 3 cm sigmoid TV adenoma and other smaller polyps - had post-polypectomy bleed Tx w/ clips Consider repeat colonoscopy 3 yrs [name], MD, [name]   . Hyperlipidemia   . implantable cardiac defibrillator-Biotronik    Device Implanted 2006; s/p gen change 03/2011 : bleeding persistent with pocket erosion and infection; explant and reimplant  06/2011  . Ischemic cardiomyopathy    EF 15 to 20% by TTE and TEE in 09/2012.  Severe LV dysfunction  . Obesity    BMI 31 in 09/2012  . Persistent atrial fibrillation with rapid ventricular response 04/21/2014  . Retroperitoneal bleed 04/21/2019  . Seizure disorder ([place]) latest 09/30/2012  . Seizures ([place])   . Stroke [facility])     Past Surgical History:  Procedure Laterality Date  . CARDIAC CATHETERIZATION N/A 10/12/2015   Procedure: Right Heart Cath;  Surgeon: [name], MD;  Location: [place] CV LAB;  Service: Cardiovascular;  Laterality: N/A;  .  CARDIOVERSION  09/24/2012   Procedure: CARDIOVERSION;  Surgeon: [name], MD;  Location: [facility] ENDOSCOPY;  Service: Cardiovascular;  Laterality: N/A;  . CARDIOVERSION N/A 12/25/2017   Procedure: CARDIOVERSION;  Surgeon: [name], MD;  Location: [facility] ORS;  Service: Cardiovascular;  Laterality: N/A;  . COLONOSCOPY W/ POLYPECTOMY  02/13/2017  . CORONARY ARTERY BYPASS GRAFT  2011   in [place]  . FLEXIBLE SIGMOIDOSCOPY N/A 02/20/2017   Procedure: FLEXIBLE SIGMOIDOSCOPY;  Surgeon: [name], MD;  Location: [facility] ENDOSCOPY;  Service: Endoscopy;  Laterality: N/A;  . FLEXIBLE SIGMOIDOSCOPY N/A 02/21/2017   Procedure: FLEXIBLE SIGMOIDOSCOPY;  Surgeon: [name], MD;  Location: [facility] ENDOSCOPY;  Service: Endoscopy;  Laterality: N/A;  . ICD    . TEE WITHOUT CARDIOVERSION  09/24/2012   Procedure: TRANSESOPHAGEAL ECHOCARDIOGRAM (TEE);  Surgeon: [name], MD;  Location: [facility] ENDOSCOPY;  Service: Cardiovascular;  Laterality: N/A;  dave/anesth, dl, cindy/echo     Family History  Problem Relation Age of Onset  . Arthritis Mother   . Heart disease Mother   . Heart attack Mother   . Other Father        smoker  . Hypertension Neg Hx        unknown  . Stroke Neg Hx        unknown    Social History:  reports that he quit smoking about 10 years ago. His smoking use included pipe. He quit smokeless tobacco use about 10 years ago. He reports that he does not drink alcohol or use drugs.  Allergies: No Known Allergies  Medications: I have reviewed the patient's current medications.  Results for orders placed or performed during the hospital encounter of 07/20/19 (from the past 48 hour(s))  CBC     Status: Abnormal   Collection Time: 07/24/19  9:28 AM  Result Value Ref Range   WBC 6.0 4.0 - 10.5 K/uL   RBC 2.66 (L) 4.22 - 5.81 MIL/uL   Hemoglobin 8.0 (L) 13.0 - 17.0 g/dL   HCT 25.8 (L) 39.0 - 52.0 %   MCV 97.0 80.0 - 100.0 fL   MCH 30.1 26.0 - 34.0 pg   MCHC 31.0 30.0 - 36.0 g/dL   RDW 15.7 (H)  11.5 - 15.5 %   Platelets 191 150 - 400 K/uL   nRBC 1.7 (H) 0.0 - 0.2 %    Comment: Performed at [facility] [place], [place] [address], [place], [place] [address]  Basic metabolic panel     Status: Abnormal   Collection Time: 07/24/19  9:28 AM  Result Value Ref Range   Sodium 136 135 - 145 mmol/L   Potassium 3.5 3.5 - 5.1 mmol/L   Chloride 107 98 - 111 mmol/L   CO2 24 22 - 32 mmol/L   Glucose, Bld 108 (H) 70 - 99 mg/dL   BUN 24 (H) 8 - 23 mg/dL   Creatinine, Ser 1.14 0.61 - 1.24 mg/dL   Calcium 7.4 (L) 8.9 - 10.3 mg/dL   GFR calc non Af Amer >60 >60 mL/min   GFR calc Af Amer >60 >60 mL/min   Anion gap 5 5 - 15    Comment: Performed at [facility], [place] [address], [place], [place] [address]  Basic metabolic panel     Status: Abnormal   Collection Time: 07/25/19  4:40 AM  Result Value Ref Range   Sodium 135 135 - 145 mmol/L   Potassium 4.3 3.5 - 5.1 mmol/L    Comment: DELTA CHECK NOTED   Chloride 102 98 - 111 mmol/L   CO2 26 22 - 32 mmol/L   Glucose, Bld 84 70 - 99 mg/dL   BUN 24 (H) 8 - 23 mg/dL   Creatinine, Ser 1.25 (H) 0.61 - 1.24 mg/dL   Calcium 7.9 (L) 8.9 - 10.3 mg/dL   GFR calc non Af Amer 58 (L) >60 mL/min   GFR calc Af Amer >60 >60 mL/min   Anion gap 7 5 - 15    Comment: Performed at [facility], [place] [address], [place],  [address]  CBC     Status: Abnormal   Collection Time: 07/25/19  4:40 AM  Result Value Ref Range   WBC 5.8 4.0 - 10.5 K/uL   RBC 2.40 (L) 4.22 - 5.81 MIL/uL   Hemoglobin 7.3 (L) 13.0 - 17.0 g/dL   HCT 23.3 (L) 39.0 - 52.0 %   MCV 97.1 80.0 - 100.0 fL   MCH 30.4 26.0 - 34.0 pg   MCHC 31.3 30.0 - 36.0 g/dL   RDW 15.0 11.5 - 15.5 %   Platelets 202 150 - 400 K/uL   nRBC 1.0 (H) 0.0 - 0.2 %    Comment: Performed at [facility], [place] [address] [place],  [address]  Glucose, capillary     Status: Abnormal   Collection Time: 07/25/19  9:38 AM  Result Value Ref Range    Glucose-Capillary 48 (L) 70 - 99 mg/dL  Glucose, capillary     Status: Abnormal  Collection Time: 07/25/19  9:48 AM  Result Value Ref Range   Glucose-Capillary 50 (L) 70 - 99 mg/dL  Glucose, capillary     Status: None   Collection Time: 07/25/19 10:02 AM  Result Value Ref Range   Glucose-Capillary 73 70 - 99 mg/dL  CBC     Status: Abnormal   Collection Time: 07/25/19 10:14 AM  Result Value Ref Range   WBC 4.7 4.0 - 10.5 K/uL   RBC 2.11 (L) 4.22 - 5.81 MIL/uL   Hemoglobin 6.5 (LL) 13.0 - 17.0 g/dL    Comment: REPEATED TO VERIFY THIS CRITICAL RESULT HAS VERIFIED AND BEEN CALLED TO HERBST,V BY MARINDA BLACK ON 08 01 2020 AT 1042, AND HAS BEEN READ BACK. CRITICAL HGB    HCT 20.7 (L) 39.0 - 52.0 %   MCV 98.1 80.0 - 100.0 fL   MCH 30.8 26.0 - 34.0 pg   MCHC 31.4 30.0 - 36.0 g/dL   RDW 15.1 11.5 - 15.5 %   Platelets 194 150 - 400 K/uL   nRBC 1.1 (H) 0.0 - 0.2 %    Comment: Performed at [facility], [place] [address], [place], [place] [address]  Basic metabolic panel     Status: Abnormal   Collection Time: 07/25/19 10:14 AM  Result Value Ref Range   Sodium 132 (L) 135 - 145 mmol/L   Potassium 4.7 3.5 - 5.1 mmol/L   Chloride 103 98 - 111 mmol/L   CO2 21 (L) 22 - 32 mmol/L   Glucose, Bld 388 (H) 70 - 99 mg/dL   BUN 26 (H) 8 - 23 mg/dL   Creatinine, Ser 1.56 (H) 0.61 - 1.24 mg/dL   Calcium 7.7 (L) 8.9 - 10.3 mg/dL   GFR calc non Af Amer 44 (L) >60 mL/min   GFR calc Af Amer 51 (L) >60 mL/min   Anion gap 8 5 - 15    Comment: Performed at [facility], [place] [address] [place],  [address]  Glucose, capillary     Status: Abnormal   Collection Time: 07/25/19 10:23 AM  Result Value Ref Range   Glucose-Capillary 47 (L) 70 - 99 mg/dL    No results found.  Blood pressure 102/86, pulse 84, temperature 98.1 F (36.7 C), temperature source Oral, resp. rate 16, height 5' 10.98" (1.803 m), weight 91.3 kg, SpO2 100 %. General appearance: alert,  cooperative and no distress Head: Normocephalic, without obvious abnormality, atraumatic Eyes: negative Nose: Nares normal. Septum midline. Mucosa normal. No drainage or sinus tenderness. Throat: Liver clots maxillary right and left premolar/canine areas. No active bleeding at present time. Pharynx clear.  Neck: no adenopathy and supple, symmetrical, trachea midline  Assessment/Plan: Intermittent persistent bleeding s/p dental extractions 07/16/2019. Plan Surgical re-closure of oral incisions with possible alveoloplasty.    [name] 07/25/2019, 11:33 AM

## 2019-07-25 NOTE — Progress Notes (Signed)
Rechecked patient's blood sugar after 15 min of OJ administration & blood sugar still low 36. Gave one ampule of dextrose.   Hypoglycemic Event  CBG: 36  Treatment: 1 ample dextrose  Follow-up CBG: Time:2115 CBG Result: 102   Comments/MD notified:[place] ordered to give 1 ampule   Jorene Minors

## 2019-07-25 NOTE — Progress Notes (Signed)
Patient's BP running low during previous shift. 96/76 recorded this am.

## 2019-07-25 NOTE — Progress Notes (Signed)
1450 blood sugar is 76. Pt is alert and oriented. His neck ,head, and face are covered in perspiration. I administer 12.5 gms of dextrose 50.

## 2019-07-25 NOTE — Progress Notes (Addendum)
[facility].   DOB:08-Jun-1946   UM#:[id number]   VQM#:[id number]  Hematology follow up note   Subjective: Pt had persistent mouth bleeding overnight and this morning, and had a near syncope episode this morning, with borderline hypotension, denies chest pain.  Hemoglobin dropped to 6.7, will get blood transfusion.  I spoke with Dr. [name] this morning.   Objective:  Vitals:   07/25/19 1424 07/25/19 1429  BP:  (!) 81/65  Pulse: 71   Resp:    Temp: (!) 97.5 F (36.4 C)   SpO2: 95%     Body mass index is 28.09 kg/m.  Intake/Output Summary (Last 24 hours) at 07/25/2019 1433 Last data filed at 07/25/2019 1300 Gross per 24 hour  Intake 1174.24 ml  Output 600 ml  Net 574.24 ml     Sclerae unicteric  Skin; no petechia or ecchymosis  Oral exam showed his gauze in mouth are saturated with blood and blood clots, mild bleeding at dental extraction sites and a few blood clots     CBG (last 3)  Recent Labs    07/25/19 1002 07/25/19 1023 07/25/19 1315  GLUCAP 73 47* 227*     Labs:  Lab Results  Component Value Date   WBC 4.7 07/25/2019   HGB 6.5 (LL) 07/25/2019   HCT 20.7 (L) 07/25/2019   MCV 98.1 07/25/2019   PLT 194 07/25/2019   NEUTROABS 4.2 07/22/2019   Urine Studies No results for input(s): UHGB, CRYS in the last 72 hours.  Invalid input(s): UACOL, UAPR, USPG, UPH, UTP, UGL, UKET, UBIL, UNIT, UROB, [place], UEPI, UWBC, Junie Panning [place], [place], [place]  Basic Metabolic Panel: Recent Labs  Lab 07/21/19 0329 07/22/19 0613 07/24/19 0928 07/25/19 0440 07/25/19 1014  NA 133* 135 136 135 132*  K 4.3 4.4 3.5 4.3 4.7  CL 102 103 107 102 103  CO2 24 24 24 26  21*  GLUCOSE 96 85 108* 84 388*  BUN 55* 52* 24* 24* 26*  CREATININE 1.51* 1.51* 1.14 1.25* 1.56*  CALCIUM 8.3* 8.4* 7.4* 7.9* 7.7*  MG  --  2.2  --   --   --   PHOS  --  4.1  --   --   --    GFR Estimated Creatinine Clearance: 50.9 mL/min (A) (by C-G formula based on SCr of 1.56 mg/dL (H)). Liver Function  Tests: Recent Labs  Lab 07/20/19 1310 07/22/19 0613  AST 17  --   ALT 17  --   ALKPHOS 65  --   BILITOT 0.5  --   PROT 7.2  --   ALBUMIN 3.0* 3.1*   No results for input(s): LIPASE, AMYLASE in the last 168 hours. No results for input(s): AMMONIA in the last 168 hours. Coagulation profile Recent Labs  Lab 07/22/19 1224  INR 1.2    CBC: Recent Labs  Lab 07/20/19 1310  07/22/19 0613 07/22/19 1224 07/23/19 0409 07/24/19 0928 07/25/19 0440 07/25/19 1014  WBC 5.6   < > 6.3  --  5.6 6.0 5.8 4.7  NEUTROABS 3.9  --  4.2  --   --   --   --   --   HGB 8.3*   < > 8.8*  --  6.7* 8.0* 7.3* 6.5*  HCT 25.9*   < > 28.0*  --  20.8* 25.8* 23.3* 20.7*  MCV 94.9   < > 97.6  --  97.2 97.0 97.1 98.1  PLT 126*   < > 149* 152 149* 191 202 194   < > =  values in this interval not displayed.   Cardiac Enzymes: No results for input(s): CKTOTAL, CKMB, CKMBINDEX, TROPONINI in the last 168 hours. BNP: Invalid input(s): POCBNP CBG: Recent Labs  Lab 07/25/19 0938 07/25/19 0948 07/25/19 1002 07/25/19 1023 07/25/19 1315  GLUCAP 48* 50* 73 47* 227*   D-Dimer No results for input(s): DDIMER in the last 72 hours. Hgb A1c No results for input(s): HGBA1C in the last 72 hours. Lipid Profile No results for input(s): CHOL, HDL, LDLCALC, TRIG, CHOLHDL, LDLDIRECT in the last 72 hours. Thyroid function studies No results for input(s): TSH, T4TOTAL, T3FREE, THYROIDAB in the last 72 hours.  Invalid input(s): FREET3 Anemia work up No results for input(s): VITAMINB12, FOLATE, FERRITIN, TIBC, IRON, RETICCTPCT in the last 72 hours. Microbiology Recent Results (from the past 240 hour(s))  SARS Coronavirus 2 (CEPHEID - Performed in [facility] hospital lab), Hosp Order     Status: None   Collection Time: 07/20/19  1:58 PM   Specimen: Nasopharyngeal Swab  Result Value Ref Range Status   SARS Coronavirus 2 NEGATIVE NEGATIVE Final    Comment: (NOTE) If result is NEGATIVE SARS-CoV-2 target nucleic  acids are NOT DETECTED. The SARS-CoV-2 RNA is generally detectable in upper and lower  respiratory specimens during the acute phase of infection. The lowest  concentration of SARS-CoV-2 viral copies this assay can detect is 250  copies / mL. A negative result does not preclude SARS-CoV-2 infection  and should not be used as the sole basis for treatment or other  patient management decisions.  A negative result may occur with  improper specimen collection / handling, submission of specimen other  than nasopharyngeal swab, presence of viral mutation(s) within the  areas targeted by this assay, and inadequate number of viral copies  (<250 copies / mL). A negative result must be combined with clinical  observations, patient history, and epidemiological information. If result is POSITIVE SARS-CoV-2 target nucleic acids are DETECTED. The SARS-CoV-2 RNA is generally detectable in upper and lower  respiratory specimens dur ing the acute phase of infection.  Positive  results are indicative of active infection with SARS-CoV-2.  Clinical  correlation with patient history and other diagnostic information is  necessary to determine patient infection status.  Positive results do  not rule out bacterial infection or co-infection with other viruses. If result is PRESUMPTIVE POSTIVE SARS-CoV-2 nucleic acids MAY BE PRESENT.   A presumptive positive result was obtained on the submitted specimen  and confirmed on repeat testing.  While 2019 novel coronavirus  (SARS-CoV-2) nucleic acids may be present in the submitted sample  additional confirmatory testing may be necessary for epidemiological  and / or clinical management purposes  to differentiate between  SARS-CoV-2 and other Sarbecovirus currently known to infect humans.  If clinically indicated additional testing with an alternate test  methodology [phone]) is advised. The SARS-CoV-2 RNA is generally  detectable in upper and lower respiratory  sp ecimens during the acute  phase of infection. The expected result is Negative. Fact Sheet for Patients:  StrictlyIdeas.no Fact Sheet for Healthcare Providers: BankingDealers.co.za This test is not yet approved or cleared by the [place] FDA and has been authorized for detection and/or diagnosis of SARS-CoV-2 by FDA under an Emergency Use Authorization (EUA).  This EUA will remain in effect (meaning this test can be used) for the duration of the COVID-19 declaration under Section 564(b)(1) of the Act, 21 U.S.C. section 360bbb-3(b)(1), unless the authorization is terminated or revoked sooner. Performed at [facility], [place] [address], [place], [place] [address]       Studies:  No results found.  Assessment: 73 y.o.   1.  Postop bleeding from extensive dental extraction 2.  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 Anemia of blood loss  4. CHF with AICD 5. AF 6. Seizure disorder  7. CKD    Plan:  -He has been receiving oral Amica mouth wash swish and swallow, oral Amicar 5g last night and this morning, and one dose vitK 20mg  7/31 -worsening mouth bleeding from previous dental extraction. I spoke with Dr. [name] this morning and he will bring him to OR for rclosure of oral incisions with possible alveoloplasty  -Due to his risk of thrombosis from factor VII replacement (he has AF and CHF which increase risk of thrombosis), will hold on factor VII replacement for now  -blood transfusion to keep Hg>8.0 -will f/u closely. Discussed with Dr. Wyline Copas this morning  -I called his wife and updated her about his condition, she appreciated the call    Truitt Merle, MD 07/23/2019

## 2019-07-25 NOTE — Progress Notes (Signed)
[address] Pt was assisted  the BR. .While on the toilet his eyes rolled back and he became unresponsive. He came to and stated 'just let me lay in the floor" then he became unresponsive again. Assistance was called and rapid response was called. GBG  was in the 30..The rapid response RN Gave him 4 syringes of D 50.Dr Earlie Counts arrived.He ordered D50 at 100 until the chemistry came back with a glucoses  Of 338.. New orders were placed. His gums continued to bleed. I unit of blood was ordered.. The Rapid Response RN stayed with Mr [place]. This am ans she will place her notes. His BP  PRE BLOOD WAS 81/65, 1453 Bp  Was 98/65. Dr Earlie Counts has been notified of VS . .O2 sats  Dropped down to tha 80"s while blood was infusing.He has been placed up to 4 L of O2  To get his Sat up to 92. He is currently on 2 L  Of O2 . O2 Sat was 94.%. Orders have been placed. For a chest xray and to stop the LR INFUSION.

## 2019-07-25 NOTE — TOC Progression Note (Signed)
Transition of Care (TOC) - Progression Note    Patient Details  Name: Louis Ford. MRN: [id number] Date of Birth: 1946-03-04  Transition of Care [facility]) CM/SW Contact  Ross Ludwig, [place] Phone Number: 07/25/2019, 6:05 PM  Clinical Narrative:     CSW spoke with patient's wife, and presented bed offers.  Patient's wife would like him to go to [facility].  CSW will attempt to contact [name] and have them start insurance authorization.  CSW continuing to follow patient's progress throughout discharge planning.      Expected Discharge Plan and Services           Expected Discharge Date: (unknown)                                     Social Determinants of Health (SDOH) Interventions    Readmission Risk Interventions No flowsheet data found.

## 2019-07-25 NOTE — Progress Notes (Signed)
Pt was seen earlier this shift while Rapid Response RN was at the bedside;  PIV was started with ultrasound;  Received another consult for another Restart, as the iv that was placed this morning infiltrated.   Attempted 2 more times to start another IV, using ultrasound, but veins blew;  Pt with poor venous status;  RN has notified MD of iv access issues.

## 2019-07-25 NOTE — Progress Notes (Signed)
CRITICAL VALUE ALERT  Critical Value:  hgb 6.7  Date & Time Notied:  07/25/2019 1021 Provider Notified: Veverly Fells  Orders Received/Actions taken: waiting on orders

## 2019-07-25 NOTE — Progress Notes (Signed)
I sent a text message to Louis Ford that Mr Houstons mouth was bleeding a lot through a folded was clothe ann packs of gauze

## 2019-07-26 ENCOUNTER — Encounter (HOSPITAL_COMMUNITY)
Admission: EM | Disposition: A | Discharge status: Skilled Nursing Facility | Payer: Self-pay | Source: Home / Self Care | Attending: Internal Medicine

## 2019-07-26 ENCOUNTER — Inpatient Hospital Stay (HOSPITAL_COMMUNITY): Payer: Medicare Other | Admitting: Anesthesiology

## 2019-07-26 DIAGNOSIS — K068 Other specified disorders of gingiva and edentulous alveolar ridge: Secondary | ICD-10-CM | POA: Diagnosis not present

## 2019-07-26 HISTORY — PX: ALVEOLOPLASTY: SHX5710

## 2019-07-26 LAB — BASIC METABOLIC PANEL
Anion gap: 6 (ref 5–15)
BUN: 20 mg/dL (ref 8–23)
CO2: 26 mmol/L (ref 22–32)
Calcium: 8.1 mg/dL — ABNORMAL LOW (ref 8.9–10.3)
Chloride: 103 mmol/L (ref 98–111)
Creatinine, Ser: 1.2 mg/dL (ref 0.61–1.24)
GFR calc Af Amer: >60 mL/min (ref >60)
GFR calc non Af Amer: >60 mL/min (ref >60)
Glucose, Bld: 103 mg/dL — ABNORMAL HIGH (ref 70–99)
Potassium: 4.1 mmol/L (ref 3.5–5.1)
Sodium: 135 mmol/L (ref 135–145)

## 2019-07-26 LAB — CBC
HCT: 25.1 % — ABNORMAL LOW (ref 39.0–52.0)
Hemoglobin: 8 g/dL — ABNORMAL LOW (ref 13.0–17.0)
MCH: 29.9 pg (ref 26.0–34.0)
MCHC: 31.9 g/dL (ref 30.0–36.0)
MCV: 93.7 fL (ref 80.0–100.0)
Platelets: 206 10*3/uL (ref 150–400)
RBC: 2.68 MIL/uL — ABNORMAL LOW (ref 4.22–5.81)
RDW: 15.9 % — ABNORMAL HIGH (ref 11.5–15.5)
WBC: 7 10*3/uL (ref 4.0–10.5)
nRBC: 0.7 % — ABNORMAL HIGH (ref 0.0–0.2)

## 2019-07-26 LAB — GLUCOSE, CAPILLARY
Glucose-Capillary: 100 mg/dL — ABNORMAL HIGH (ref 70–99)
Glucose-Capillary: 110 mg/dL — ABNORMAL HIGH (ref 70–99)
Glucose-Capillary: 114 mg/dL — ABNORMAL HIGH (ref 70–99)
Glucose-Capillary: 30 mg/dL — CL (ref 70–99)
Glucose-Capillary: 57 mg/dL — ABNORMAL LOW (ref 70–99)
Glucose-Capillary: 75 mg/dL (ref 70–99)
Glucose-Capillary: 76 mg/dL (ref 70–99)
Glucose-Capillary: 88 mg/dL (ref 70–99)
Glucose-Capillary: 90 mg/dL (ref 70–99)
Glucose-Capillary: 91 mg/dL (ref 70–99)
Glucose-Capillary: 99 mg/dL (ref 70–99)

## 2019-07-26 LAB — TYPE AND SCREEN
ABO/RH(D): O POS
Antibody Screen: NEGATIVE
Unit division: 0

## 2019-07-26 LAB — HEMOGLOBIN A1C
Hgb A1c MFr Bld: 5.2 % (ref 4.8–5.6)
Mean Plasma Glucose: 102.54 mg/dL

## 2019-07-26 LAB — BPAM RBC
Blood Product Expiration Date: 202008282359
ISSUE DATE / TIME: 202008011420
Unit Type and Rh: 5100

## 2019-07-26 SURGERY — ALVEOLOPLASTY
Anesthesia: General | Site: Mouth

## 2019-07-26 MED ORDER — PHENYLEPHRINE 40 MCG/ML (10ML) SYRINGE FOR IV PUSH (FOR BLOOD PRESSURE SUPPORT)
PREFILLED_SYRINGE | INTRAVENOUS | Status: DC | PRN
  Administered 2019-07-26 (×2): 120 ug via INTRAVENOUS
  Administered 2019-07-26 (×3): 80 ug via INTRAVENOUS

## 2019-07-26 MED ORDER — LIDOCAINE 2% (20 MG/ML) 5 ML SYRINGE
INTRAMUSCULAR | Status: AC
  Filled 2019-07-26: qty 5

## 2019-07-26 MED ORDER — SODIUM CHLORIDE 0.9 % IV SOLN
INTRAVENOUS | Status: DC | PRN
  Administered 2019-07-26: 40 ug/min via INTRAVENOUS

## 2019-07-26 MED ORDER — THROMBIN 5000 UNITS EX SOLR
OROMUCOSAL | Status: DC | PRN
  Administered 2019-07-26: 5 mL via TOPICAL

## 2019-07-26 MED ORDER — LACTATED RINGERS IV SOLN
INTRAVENOUS | Status: DC | PRN
  Administered 2019-07-26: 16:00:00 via INTRAVENOUS

## 2019-07-26 MED ORDER — ONDANSETRON HCL 4 MG/2ML IJ SOLN
INTRAMUSCULAR | Status: DC | PRN
  Administered 2019-07-26: 4 mg via INTRAVENOUS

## 2019-07-26 MED ORDER — ONDANSETRON HCL 4 MG/2ML IJ SOLN
INTRAMUSCULAR | Status: AC
  Filled 2019-07-26: qty 2

## 2019-07-26 MED ORDER — DEXTROSE-NACL 5-0.9 % IV SOLN
INTRAVENOUS | Status: DC
  Administered 2019-07-26 – 2019-07-28 (×2): via INTRAVENOUS

## 2019-07-26 MED ORDER — FENTANYL CITRATE (PF) 100 MCG/2ML IJ SOLN
25.0000 ug | INTRAMUSCULAR | Status: DC | PRN
  Administered 2019-07-26 (×2): 25 ug via INTRAVENOUS
  Administered 2019-07-26: 50 ug via INTRAVENOUS

## 2019-07-26 MED ORDER — BUPIVACAINE-EPINEPHRINE 0.5% -1:200000 IJ SOLN
INTRAMUSCULAR | Status: DC | PRN
  Administered 2019-07-26: 8 mL

## 2019-07-26 MED ORDER — FENTANYL CITRATE (PF) 100 MCG/2ML IJ SOLN
INTRAMUSCULAR | Status: DC | PRN
  Administered 2019-07-26 (×2): 50 ug via INTRAVENOUS

## 2019-07-26 MED ORDER — FENTANYL CITRATE (PF) 100 MCG/2ML IJ SOLN
INTRAMUSCULAR | Status: AC
  Filled 2019-07-26: qty 2

## 2019-07-26 MED ORDER — PROPOFOL 10 MG/ML IV BOLUS
INTRAVENOUS | Status: AC
  Filled 2019-07-26: qty 20

## 2019-07-26 MED ORDER — LACTATED RINGERS IV SOLN: INTRAVENOUS | Status: DC

## 2019-07-26 MED ORDER — BUPIVACAINE-EPINEPHRINE (PF) 0.5% -1:200000 IJ SOLN
INTRAMUSCULAR | Status: AC
  Filled 2019-07-26: qty 1.8

## 2019-07-26 MED ORDER — CHLORHEXIDINE GLUCONATE CLOTH 2 % EX PADS: 6.0000 | MEDICATED_PAD | Freq: Once | CUTANEOUS | Status: DC

## 2019-07-26 MED ORDER — CEFAZOLIN SODIUM-DEXTROSE 2-4 GM/100ML-% IV SOLN
INTRAVENOUS | Status: AC
  Filled 2019-07-26: qty 100

## 2019-07-26 MED ORDER — THROMBIN (RECOMBINANT) 5000 UNITS EX SOLR
CUTANEOUS | Status: AC
  Filled 2019-07-26: qty 5000

## 2019-07-26 MED ORDER — LIDOCAINE-EPINEPHRINE 2 %-1:100000 IJ SOLN
INTRAMUSCULAR | Status: DC | PRN
  Administered 2019-07-26: 6 mL

## 2019-07-26 MED ORDER — DEXTROSE 50 % IV SOLN
INTRAVENOUS | Status: AC
  Filled 2019-07-26: qty 50

## 2019-07-26 MED ORDER — PHENYLEPHRINE 40 MCG/ML (10ML) SYRINGE FOR IV PUSH (FOR BLOOD PRESSURE SUPPORT)
PREFILLED_SYRINGE | INTRAVENOUS | Status: AC
  Filled 2019-07-26: qty 20

## 2019-07-26 MED ORDER — MORPHINE SULFATE (PF) 2 MG/ML IV SOLN
2.0000 mg | Freq: Once | INTRAVENOUS | Status: AC
  Administered 2019-07-26: 2 mg via INTRAVENOUS
  Filled 2019-07-26: qty 1

## 2019-07-26 MED ORDER — PROPOFOL 10 MG/ML IV BOLUS
INTRAVENOUS | Status: DC | PRN
  Administered 2019-07-26: 130 mg via INTRAVENOUS

## 2019-07-26 MED ORDER — DEXTROSE 50 % IV SOLN
INTRAVENOUS | Status: AC
  Administered 2019-07-26: 05:00:00 50 mL via INTRAVENOUS
  Filled 2019-07-26: qty 50

## 2019-07-26 MED ORDER — ISOPROPYL ALCOHOL 70 % SOLN
Status: AC
  Filled 2019-07-26: qty 480

## 2019-07-26 MED ORDER — LIDOCAINE 2% (20 MG/ML) 5 ML SYRINGE
INTRAMUSCULAR | Status: DC | PRN
  Administered 2019-07-26: 80 mg via INTRAVENOUS

## 2019-07-26 MED ORDER — SUCCINYLCHOLINE CHLORIDE 200 MG/10ML IV SOSY
PREFILLED_SYRINGE | INTRAVENOUS | Status: DC | PRN
  Administered 2019-07-26: 120 mg via INTRAVENOUS

## 2019-07-26 MED ORDER — 0.9 % SODIUM CHLORIDE (POUR BTL) OPTIME
TOPICAL | Status: DC | PRN
  Administered 2019-07-26: 17:00:00 1000 mL

## 2019-07-26 MED ORDER — CEFAZOLIN SODIUM-DEXTROSE 2-4 GM/100ML-% IV SOLN
2.0000 g | INTRAVENOUS | Status: AC
  Administered 2019-07-26: 2 g via INTRAVENOUS

## 2019-07-26 MED ORDER — SUCCINYLCHOLINE CHLORIDE 200 MG/10ML IV SOSY
PREFILLED_SYRINGE | INTRAVENOUS | Status: AC
  Filled 2019-07-26: qty 10

## 2019-07-26 MED ORDER — BUPIVACAINE-EPINEPHRINE 0.5% -1:200000 IJ SOLN
INTRAMUSCULAR | Status: AC
  Filled 2019-07-26: qty 1

## 2019-07-26 MED ORDER — LIDOCAINE-EPINEPHRINE 2 %-1:100000 IJ SOLN
INTRAMUSCULAR | Status: AC
  Filled 2019-07-26: qty 1

## 2019-07-26 SURGICAL SUPPLY — 30 items
ATTRACTOMAT 16X20 MAGNETIC DRP (DRAPES) ×4 IMPLANT
BAG ZIPLOCK 12X15 (MISCELLANEOUS) ×4 IMPLANT
BLADE SURG 15 STRL LF DISP TIS (BLADE) ×6 IMPLANT
BLADE SURG 15 STRL SS (BLADE) ×6
BUR CROSS CUT FISSURE 1.6 (BURR) ×3 IMPLANT
BUR CROSS CUT FISSURE 1.6MM (BURR) ×1
CANNULA VESSEL W/WING WO/VALVE (CANNULA) IMPLANT
GAUZE 4X4 16PLY RFD (DISPOSABLE) ×4 IMPLANT
GAUZE SPONGE 4X4 12PLY STRL (GAUZE/BANDAGES/DRESSINGS) ×3 IMPLANT
GLOVE BIO SURGEON STRL SZ7.5 (GLOVE) ×4 IMPLANT
GLOVE BIOGEL PI IND STRL 7.5 (GLOVE) ×3 IMPLANT
GLOVE BIOGEL PI INDICATOR 7.5 (GLOVE) ×6
GOWN STRL REUS W/TWL XL LVL3 (GOWN DISPOSABLE) ×7 IMPLANT
KIT BASIN OR (CUSTOM PROCEDURE TRAY) ×4 IMPLANT
KIT TURNOVER KIT A (KITS) IMPLANT
NEEDLE HYPO 22GX1.5 SAFETY (NEEDLE) ×4 IMPLANT
PACK EENT SPLIT (PACKS) ×4 IMPLANT
PACKING VAGINAL (PACKING) ×7 IMPLANT
SUCTION FRAZIER HANDLE 12FR (TUBING) ×2
SUCTION TUBE FRAZIER 12FR DISP (TUBING) ×2 IMPLANT
SUT CHROMIC 3 0 PS 2 (SUTURE) ×7 IMPLANT
SUT CHROMIC 4 0 P 3 18 (SUTURE) ×3 IMPLANT
SUT CHROMIC 4 0 PS 2 18 (SUTURE) ×3 IMPLANT
SUT VIC AB 3-0 PS2 18 (SUTURE) ×4
SUT VIC AB 3-0 PS2 18XBRD (SUTURE) ×4 IMPLANT
SYR 50ML LL SCALE MARK (SYRINGE) ×4 IMPLANT
SYR CONTROL 10ML LL (SYRINGE) ×4 IMPLANT
TOWEL OR 17X26 10 PK STRL BLUE (TOWEL DISPOSABLE) ×4 IMPLANT
YANKAUER SUCT BULB TIP 10FT TU (MISCELLANEOUS) ×4 IMPLANT
YANKAUER SUCT BULB TIP NO VENT (SUCTIONS) ×4 IMPLANT

## 2019-07-26 NOTE — Anesthesia Preprocedure Evaluation (Addendum)
Anesthesia Evaluation  Patient identified by MRN, date of birth, ID band Patient awake    Reviewed: Allergy & Precautions, NPO status , Patient's Chart, lab work & pertinent test results, reviewed documented beta blocker date and time   Airway Mallampati: II  TM Distance: >3 FB Neck ROM: Full    Dental  (+) Edentulous Upper, Poor Dentition   Pulmonary former smoker,    Pulmonary exam normal breath sounds clear to auscultation       Cardiovascular hypertension, Pt. on medications and Pt. 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 There was mild concentric hypertrophy. Systolic function was normal. The estimated ejection fraction was in the range of 50% to 55%. Mild hypokinesis of the inferolateral myocardium. - Ventricular septum: Septal motion showed abnormal function and dyssynchrony. - Aortic valve: Transvalvular velocity was within the normal range.There was no stenosis. There was no regurgitation. - Mitral valve: Transvalvular velocity was within the normal range. There was no evidence for stenosis. There was mild regurgitation directed posteriorly. - Left atrium: The atrium was severely dilated. - Right ventricle: The cavity size was normal. Wall thickness was normal. Systolic function was normal. - Right atrium: The atrium was mildly dilated. - Tricuspid valve: There was mild regurgitation. - Pulmonary arteries: Systolic pressure was within the normal range. PA peak pressure: 32 mm Hg (S).  EKG 07/25/2019  NSR, incomplete LBBB pattern, LAD, prolonged QTc   Neuro/Psych Seizures -, Well Controlled,  CVA, Residual Symptoms negative psych ROS   GI/Hepatic Neg liver ROS, Oral wound   Endo/Other  Hyperlipidemia  Renal/GU Renal InsufficiencyRenal  disease  negative genitourinary   Musculoskeletal  (+) Arthritis , Osteoarthritis,    Abdominal   Peds  Hematology  (+) Blood dyscrasia, anemia , Factor VII deficiency- but most recent test 07/22/2019 shows normal activity aPTT and PT both elevated INR normal   Anesthesia Other Findings   Reproductive/Obstetrics                           Anesthesia Physical Anesthesia Plan  ASA: III and emergent  Anesthesia Plan: General   Post-op Pain Management:    Induction: Intravenous  PONV Risk Score and Plan: 3 and Ondansetron and Treatment may vary due to age or medical condition  Airway Management Planned: Oral ETT  Additional Equipment:   Intra-op Plan:   Post-operative Plan: Extubation in OR  Informed Consent: I have reviewed the patients History and Physical, chart, labs and discussed the procedure including the risks, benefits and alternatives for the proposed anesthesia with the patient or authorized representative who has indicated his/her understanding and acceptance.     Dental advisory given  Plan Discussed with: CRNA and Surgeon  Anesthesia Plan Comments: (Patient had grits and cup of milk at 0830. Will wait 8 hours to proceed with surgery.)       Anesthesia Quick Evaluation

## 2019-07-26 NOTE — Anesthesia Postprocedure Evaluation (Signed)
Anesthesia Post Note  Patient: [facility].  Procedure(s) Performed: ALVEOLOPLASTY WITH RESUTURING OF ORAL WOUND (N/A Mouth)     Patient location during evaluation: PACU Anesthesia Type: General Level of consciousness: awake and alert and oriented Pain management: pain level controlled Vital Signs Assessment: post-procedure vital signs reviewed and stable Respiratory status: spontaneous breathing, nonlabored ventilation, respiratory function stable and patient connected to nasal cannula oxygen Cardiovascular status: blood pressure returned to baseline and stable Postop Assessment: no apparent nausea or vomiting Anesthetic complications: no    Last Vitals:  Vitals:   07/26/19 1815 07/26/19 1830  BP: (!) 117/91 (!) 126/98  Pulse:    Resp: 14 (!) 21  Temp:    SpO2: 100% 100%    Last Pain:  Vitals:   07/26/19 1830  TempSrc:   PainSc: 0-No pain                 Emmory Solivan A.

## 2019-07-26 NOTE — H&P (Signed)
H&P documentation  -History and Physical Reviewed  -Patient has been re-examined  -No change in the plan of care  Andoni Busch  

## 2019-07-26 NOTE — Transfer of Care (Signed)
Immediate Anesthesia Transfer of Care Note  Patient: [facility].  Procedure(s) Performed: ALVEOLOPLASTY WITH RESUTURING OF ORAL WOUND (N/A Mouth)  Patient Location: PACU  Anesthesia Type:General  Level of Consciousness: drowsy and patient cooperative  Airway & Oxygen Therapy: Patient Spontanous Breathing and Patient connected to face mask oxygen  Post-op Assessment: Report given to RN and Post -op Vital signs reviewed and stable  Post vital signs: Reviewed and stable  Last Vitals:  Vitals Value Taken Time  BP 126/98 07/26/19 1800  Temp 36.5 C 07/26/19 1800  Pulse    Resp 23 07/26/19 1801  SpO2    Vitals shown include unvalidated device data.  Last Pain:  Vitals:   07/26/19 1351  TempSrc: Oral  PainSc:       Patients Stated Pain Goal: 0 (74/93/55 2174)  Complications: No apparent anesthesia complications

## 2019-07-26 NOTE — Progress Notes (Signed)
PROGRESS NOTE    [facility].  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 Patient is poor historian. Wife could not pick up the phone. I reviewed his previous admissions. According to the patient, he has not been on blood thinners since he had that retroperitoneal bleed about 2 months ago. He had been otherwise doing well. He walks with a walker. Also has a history of seizure and is on multiple medications. He went to dentist and had almost all of his teeth removed on 07/08/2019, following that he had continued bleeding into his mouth and they had talked to their dentist who recommended to control with a gauze piece. His wife has been trying to feed him with Gatorade and chicken soup but otherwise he has not been able to eat much. Patient states that 2 days ago he fell and hit his head had some pain but improved now. The reason he came to the hospital today is feeling dizzy and blood pressure dropping on ambulation.   Assessment & Plan:   Principal Problem:   Anemia due to blood loss, acute Active Problems:   HTN (hypertension)   Coronary artery disease involving native heart   Automatic implantable cardioverter-defibrillator in situ   Seizure (HCC)   Renal insufficiency   Factor VII deficiency (HCC)   Chronic systolic CHF (congestive heart failure), NYHA class 2 (HCC)   Chronic kidney disease (CKD), stage III (moderate) (HCC)  Anemia due to acute blood loss, symptomatic anemia:      - s/p 1 unit pRBCs d/t symptomatic anemia; appropriate response (Hgb 8.3 - 9.3)     - oral bleeding from recent dental procedure (dental team consulted and is following)     - Increased bleeding recently, followed by dental service and oncology, appreciate input. Now s/p second round of 10 doses of amicar.  - Given FFP infusion per Oncology and 1 unit PRBC on 7/30 with post-transfusion hgb of 8.0 - Followed by Dr. Enrique Sack, recommendation for eval by oral surgeon, Dr. [name] - Hgb this AM 8.0, stable - Appreciate input by Dr. [name]. Undergoing suture of oral incisions today  Dizziness/lightheadedness     - multifactorial; in part d/t above     - seems resolved this AM  Chronic congestive heart failure status post AICD:      - d/t hypotension, continued rate controlling medication including amiodarone and beta-blockers, but stopped Aldactone and nitrates as well as hydralazine     - seems stable currently  Chronic atrial fibrillation     - Rate controlled on amiodarone and Coreg.  Unable to anticoagulate. -  Stable at this time  History of seizure disorder:      - Unlikely recurrent seizure.       - Continued on multiple medications.       - Valproic acid level initially noted to be low, cont current regimen as tolerated  Chronic kidney disease stage III due to hypertension:      - At about his baseline; will cont to monitor    - recheck bmet in AM  Dementia -Pt more  agitated this AM and initially refused blood transfusion -Discussed with pt's wife who states she makes medical decisions for pt -Updated wife on phone - stable currently    DVT prophylaxis: SCD's Code Status: Full Family Communication: Pt in room, family not in room Disposition Plan: Possible d/c home when no longer bleeding  Consultants:   Dental service  Hematology  Oral surgeon  Procedures:     Antimicrobials: Anti-infectives (From admission, onward)   Start     Dose/Rate Route  Frequency Ordered Stop   07/26/19 1602  ceFAZolin (ANCEF) 2-4 GM/100ML-% IVPB    Note to Pharmacy: Darlys Gales   : cabinet override      07/26/19 1602 07/27/19 0414   07/26/19 0845  ceFAZolin (ANCEF) IVPB 2g/100 mL premix     2 g 200 mL/hr over 30 Minutes Intravenous On call to O.R. 07/26/19 2376 07/27/19 0559   07/21/19 0845  amoxicillin (AMOXIL) capsule 500 mg     500 mg Oral Every 8 hours 07/21/19 0837 07/24/19 2203   07/20/19 2200  amoxicillin (AMOXIL) capsule 500 mg  Status:  Discontinued     500 mg Oral 2 times daily 07/20/19 1801 07/21/19 0837      Subjective: Without complaints this AM  Objective: Vitals:   07/25/19 1708 07/25/19 2019 07/26/19 0511 07/26/19 1351  BP: 98/60 90/73 92/69  (!) 88/72  Pulse: 88 80 (!) 53 84  Resp:  20 20 16   Temp: 97.8 F (36.6 C) 97.6 F (36.4 C) (!) 97.5 F (36.4 C) 98.3 F (36.8 C)  TempSrc:  Oral Oral Oral  SpO2: 100% 97% (!) 87% 100%  Weight:   93.3 kg   Height:        Intake/Output Summary (Last 24 hours) at 07/26/2019 1641 Last data filed at 07/26/2019 0900 Gross per 24 hour  Intake 100 ml  Output 525 ml  Net -425 ml   Filed Weights   07/24/19 0500 07/25/19 0621 07/26/19 0511  Weight: 91.4 kg 91.3 kg 93.3 kg    Examination: General exam: Awake, laying in bed, in nad Respiratory system: Normal respiratory effort, no wheezing Cardiovascular system: regular rate, s1, s2 Gastrointestinal system: Soft, nondistended, positive BS Central nervous system: CN2-12 grossly intact, strength intact Extremities: Perfused, no clubbing Skin: Normal skin turgor, no notable skin lesions seen Psychiatry: Mood normal // no visual hallucinations   Data Reviewed: I have personally reviewed following labs and imaging studies  CBC: Recent Labs  Lab 07/20/19 1310  07/22/19 0613  07/23/19 0409 07/24/19 0928 07/25/19 0440 07/25/19 1014 07/25/19 2022 07/26/19 0646  WBC 5.6   < > 6.3  --  5.6 6.0 5.8 4.7  --  7.0  NEUTROABS 3.9  --   4.2  --   --   --   --   --   --   --   HGB 8.3*   < > 8.8*  --  6.7* 8.0* 7.3* 6.5* 7.9* 8.0*  HCT 25.9*   < > 28.0*  --  20.8* 25.8* 23.3* 20.7* 24.5* 25.1*  MCV 94.9   < > 97.6  --  97.2 97.0 97.1 98.1  --  93.7  PLT 126*   < > 149*   < > 149* 191 202 194  --  206   < > = values in this interval not displayed.   Basic Metabolic Panel: Recent Labs  Lab 07/22/19 0613 07/24/19 0928 07/25/19 0440 07/25/19 1014 07/26/19 0831  NA 135 136 135 132* 135  K 4.4 3.5 4.3 4.7 4.1  CL 103 107 102 103 103  CO2 24 24 26  21* 26  GLUCOSE 85 108* 84 388* 103*  BUN 52* 24* 24* 26* 20  CREATININE 1.51* 1.14 1.25* 1.56* 1.20  CALCIUM 8.4* 7.4* 7.9* 7.7* 8.1*  MG 2.2  --   --   --   --   PHOS 4.1  --   --   --   --    GFR: Estimated Creatinine Clearance: 66.8 mL/min (by C-G formula based on SCr of 1.2 mg/dL). Liver Function Tests: Recent Labs  Lab 07/20/19 1310 07/22/19 0613  AST 17  --   ALT 17  --   ALKPHOS 65  --   BILITOT 0.5  --   PROT 7.2  --   ALBUMIN 3.0* 3.1*   No results for input(s): LIPASE, AMYLASE in the last 168 hours. No results for input(s): AMMONIA in the last 168 hours. Coagulation Profile: Recent Labs  Lab 07/22/19 1224  INR 1.2   Cardiac Enzymes: No results for input(s): CKTOTAL, CKMB, CKMBINDEX, TROPONINI in the last 168 hours. BNP (last 3 results) Recent Labs    11/26/18 1025  PROBNP 56.0   HbA1C: Recent Labs    07/25/19 2022  HGBA1C 5.2   CBG: Recent Labs  Lab 07/26/19 0517 07/26/19 0800 07/26/19 1155 07/26/19 1441 07/26/19 1508  GLUCAP 75 91 88 76 114*   Lipid Profile: No results for input(s): CHOL, HDL, LDLCALC, TRIG, CHOLHDL, LDLDIRECT in the last 72 hours. Thyroid Function Tests: No results for input(s): TSH, T4TOTAL, FREET4, T3FREE, THYROIDAB in the last 72 hours. Anemia Panel: No results for input(s): VITAMINB12, FOLATE, FERRITIN, TIBC, IRON, RETICCTPCT in the last 72 hours. Sepsis Labs: No results for input(s): PROCALCITON,  LATICACIDVEN in the last 168 hours.  Recent Results (from the past 240 hour(s))  SARS Coronavirus 2 (CEPHEID - Performed in [place] hospital lab), Hosp Order     Status: None   Collection Time: 07/20/19  1:58 PM   Specimen: Nasopharyngeal Swab  Result Value Ref Range Status   SARS Coronavirus 2 NEGATIVE NEGATIVE Final    Comment: (NOTE) If result is NEGATIVE SARS-CoV-2 target nucleic acids are NOT DETECTED. The SARS-CoV-2 RNA is generally detectable in upper and lower  respiratory specimens during the acute phase of infection. The lowest  concentration of SARS-CoV-2 viral copies this assay can detect is 250  copies / mL. A negative result does not preclude SARS-CoV-2 infection  and should not be used as the sole basis for treatment or other  patient management decisions.  A negative result may occur with  improper specimen collection / handling, submission of specimen other  than nasopharyngeal swab, presence of viral mutation(s) within the  areas targeted by this assay, and inadequate number of viral copies  (<250 copies / mL). A negative result must be combined with clinical  observations, patient history, and epidemiological information. If result is POSITIVE SARS-CoV-2 target nucleic acids are DETECTED. The SARS-CoV-2 RNA is generally detectable in upper and lower  respiratory specimens dur ing the acute phase of infection.  Positive  results are indicative of active infection with SARS-CoV-2.  Clinical  correlation with patient history and other diagnostic information is  necessary to determine patient infection status.  Positive results do  not rule out bacterial infection or co-infection with other viruses. If result is PRESUMPTIVE POSTIVE SARS-CoV-2 nucleic acids MAY BE PRESENT.   A presumptive positive result was obtained on the submitted specimen  and confirmed on repeat testing.  While 2019 novel coronavirus  (SARS-CoV-2) nucleic acids may be present in the submitted  sample  additional confirmatory testing may be necessary for epidemiological  and / or clinical management purposes  to differentiate between  SARS-CoV-2 and other Sarbecovirus currently known to infect humans.  If clinically indicated additional testing with an alternate test  methodology [phone]) is advised. The SARS-CoV-2 RNA is generally  detectable in upper and lower respiratory sp ecimens during the acute  phase of infection. The expected result is Negative. Fact Sheet for Patients:  StrictlyIdeas.no Fact Sheet for Healthcare Providers: BankingDealers.co.za This test is not yet approved or cleared by the [place] FDA and has been authorized for detection and/or diagnosis of SARS-CoV-2 by FDA under an Emergency Use Authorization (EUA).  This EUA will remain in effect (meaning this test can be used) for the duration of the COVID-19 declaration under Section 564(b)(1) of the Act, 21 U.S.C. section 360bbb-3(b)(1), unless the authorization is terminated or revoked sooner. Performed at [facility], [place] [address], [place], [place] [address]      Radiology Studies: Dg Chest Port 1 View  Result Date: 07/25/2019 CLINICAL DATA:  Patient became unresponsive with exertion. EXAM: PORTABLE CHEST 1 VIEW COMPARISON:  Chest CT, 04/21/2019.  Chest radiographs, 12/07/2019. FINDINGS: There stable changes from prior cardiac surgery. Cardiac silhouette is mildly enlarged. No mediastinal or hilar masses. Lungs are clear.  No pleural effusion or pneumothorax. Right anterior chest wall single lead pacemaker is stable. Skeletal structures are grossly intact. IMPRESSION: No acute cardiopulmonary disease. Electronically Signed   By: Lajean Manes M.D.   On: 07/25/2019 16:57   [place] Ekg Site Rite  Result Date: 07/25/2019 If Site Rite image not attached, placement could not be confirmed due to current cardiac rhythm.   Scheduled Meds: .  [MAR Hold] amiodarone  200 mg Oral Daily  . Chlorhexidine Gluconate Cloth  6 each Topical Once   And  . Chlorhexidine Gluconate Cloth  6 each Topical Once  . dextrose      . [MAR Hold] divalproex  500 mg Oral Daily  . [MAR Hold] DULoxetine  30 mg Oral Daily  . [MAR Hold] feeding supplement (ENSURE ENLIVE)  237 mL Oral TID BM  . [MAR Hold] insulin aspart  0-9 Units Subcutaneous Q4H  . [MAR Hold] levETIRAcetam  1,000 mg Oral BID  . [MAR Hold] phenytoin  100 mg Oral BID  . [MAR Hold] simvastatin  20 mg Oral q1800  . [MAR Hold] tamsulosin  0.4 mg Oral Daily   Continuous Infusions: . ceFAZolin    .  ceFAZolin (ANCEF) IV    . dextrose 5 % and 0.9% NaCl 50 mL/hr at 07/26/19 0817     LOS: 5 days   Marylu Lund, MD [facility] Pager On Amion  If 7PM-7AM, please contact night-coverage 07/26/2019, 4:41 PM

## 2019-07-26 NOTE — Op Note (Signed)
07/26/2019  5:36 PM  PATIENT:  Louis Ford.  73 y.o. male  PRE-OPERATIVE DIAGNOSIS:  oral wound with persistent/intermittant bleeding, irregular alveolus  POST-OPERATIVE DIAGNOSIS:  SAME  PROCEDURE:  Procedure(s): ALVEOLOPLASTY WITH RESUTURING OF ORAL WOUND  SURGEON:  Surgeon(s): [name], [name]  ANESTHESIA:   local and general  EBL:  minimal  DRAINS: none   SPECIMEN:  No Specimen  COUNTS:  YES  PLAN OF CARE: Discharge to home after PACU  PATIENT DISPOSITION:  PACU - hemodynamically stable.   PROCEDURE DETAILS: Dictation # [id number]  [name], DMD 07/26/2019 5:36 PM

## 2019-07-26 NOTE — Progress Notes (Addendum)
Patient's 3rd hypoglycemic event overnight.   Hypoglycemic Event  CBG:56  Treatment: 15 oz OJ  Symptoms: Shaky  Follow-up CBG: [id number] CBG Result: 30   Possible Reasons for Event: UNKNOWN MULTIPLE HYPOGLYCEMIC EVENTS IN 24HR.   Comments/MD notified: [place]   At 0445 OJ ineffective, 1 ample of dextrose given, blood sugar up to 75.    Jorene Minors

## 2019-07-26 NOTE — Anesthesia Procedure Notes (Signed)
Procedure Name: Intubation Date/Time: 07/26/2019 4:25 PM Performed by: Montel Clock, CRNA Pre-anesthesia Checklist: Patient identified, Emergency Drugs available, Suction available, Patient being monitored and Timeout performed Patient Re-evaluated:Patient Re-evaluated prior to induction Oxygen Delivery Method: Circle system utilized Preoxygenation: Pre-oxygenation with 100% oxygen Induction Type: IV induction, Rapid sequence and Cricoid Pressure applied Laryngoscope Size: Mac and 3 Grade View: Grade II Tube type: Oral Tube size: 7.5 mm Number of attempts: 1 Airway Equipment and Method: Stylet Placement Confirmation: ETT inserted through vocal cords under direct vision,  positive ETCO2 and breath sounds checked- equal and bilateral Secured at: 23 cm Tube secured with: Tape Dental Injury: Teeth and Oropharynx as per pre-operative assessment

## 2019-07-27 ENCOUNTER — Encounter (HOSPITAL_COMMUNITY): Payer: Self-pay | Admitting: Oral Surgery

## 2019-07-27 LAB — CBC
HCT: 26.9 % — ABNORMAL LOW (ref 39.0–52.0)
Hemoglobin: 8.2 g/dL — ABNORMAL LOW (ref 13.0–17.0)
MCH: 28.9 pg (ref 26.0–34.0)
MCHC: 30.5 g/dL (ref 30.0–36.0)
MCV: 94.7 fL (ref 80.0–100.0)
Platelets: 263 10*3/uL (ref 150–400)
RBC: 2.84 MIL/uL — ABNORMAL LOW (ref 4.22–5.81)
RDW: 15.5 % (ref 11.5–15.5)
WBC: 11.3 10*3/uL — ABNORMAL HIGH (ref 4.0–10.5)
nRBC: 0.4 % — ABNORMAL HIGH (ref 0.0–0.2)

## 2019-07-27 LAB — GLUCOSE, CAPILLARY
Glucose-Capillary: 100 mg/dL — ABNORMAL HIGH (ref 70–99)
Glucose-Capillary: 100 mg/dL — ABNORMAL HIGH (ref 70–99)
Glucose-Capillary: 114 mg/dL — ABNORMAL HIGH (ref 70–99)
Glucose-Capillary: 116 mg/dL — ABNORMAL HIGH (ref 70–99)
Glucose-Capillary: 128 mg/dL — ABNORMAL HIGH (ref 70–99)
Glucose-Capillary: 52 mg/dL — ABNORMAL LOW (ref 70–99)
Glucose-Capillary: 85 mg/dL (ref 70–99)

## 2019-07-27 MED ORDER — PHENYTOIN SODIUM 50 MG/ML IJ SOLN
65.0000 mg | Freq: Three times a day (TID) | INTRAMUSCULAR | Status: DC
  Administered 2019-07-27 – 2019-07-29 (×6): 65 mg via INTRAVENOUS
  Filled 2019-07-27 (×6): qty 1.3

## 2019-07-27 MED ORDER — LEVETIRACETAM IN NACL 1000 MG/100ML IV SOLN
1000.0000 mg | Freq: Two times a day (BID) | INTRAVENOUS | Status: DC
  Administered 2019-07-27 – 2019-07-28 (×3): 1000 mg via INTRAVENOUS
  Filled 2019-07-27 (×4): qty 100

## 2019-07-27 MED ORDER — MORPHINE SULFATE (PF) 2 MG/ML IV SOLN
2.0000 mg | INTRAVENOUS | Status: DC | PRN
  Administered 2019-07-27 – 2019-07-28 (×4): 2 mg via INTRAVENOUS
  Filled 2019-07-27 (×4): qty 1

## 2019-07-27 MED ORDER — PHENYTOIN SODIUM 50 MG/ML IJ SOLN
100.0000 mg | Freq: Three times a day (TID) | INTRAMUSCULAR | Status: DC
  Administered 2019-07-27: 100 mg via INTRAVENOUS
  Filled 2019-07-27 (×2): qty 2

## 2019-07-27 MED ORDER — MORPHINE SULFATE (PF) 2 MG/ML IV SOLN: 2.0000 mg | INTRAVENOUS | Status: DC | PRN

## 2019-07-27 NOTE — Op Note (Signed)
NAME: Louis Ford, Louis Ford MEDICAL RECORD [id number] ACCOUNT [id number] DATE OF BIRTH:01-19-1946 FACILITY: [facility] LOCATION: [id number] PHYSICIAN:Tran Randle M. Shala Baumbach, [name]  OPERATIVE REPORT  DATE OF PROCEDURE:  07/26/2019  PREOPERATIVE DIAGNOSIS:  Oral wound with persistent intermittent bleeding, irregular maxillary and mandibular alveolus.  POSTOPERATIVE DIAGNOSIS:  Oral wound with persistent intermittent bleeding, irregular maxillary and mandibular alveolus.  PROCEDURE:  Resuturing oral wound with primary closure, alveoplasty maxilla and mandible.  SURGEON:  [name], [name]  ANESTHESIA:  General oral intubation.  DESCRIPTION OF PROCEDURE:  The patient was taken to the operating room and placed on the table in supine position.  General anesthesia was administered intravenously and an oral endotracheal tube was placed and secured.  The patient was draped for  surgery.  A timeout was performed.  The posterior pharynx was suctioned and a throat pack was placed, 2% lidocaine 1:100,000 epinephrine was infiltrated buccally and palatally in the anterior maxilla from premolar to premolar region.  A #15 blade was  used to make an incision along the alveolar crest.  Irregular tissue was trimmed with the incision to allow for primary closure.  The tissues were reflected buccally to expose the irregular alveolus.  The area was then trimmed with rongeur and bone file  as well as a diamond round bur.  Then, the area was suctioned and Gelfoam sponge soaked in topical thrombin was placed in the incision site and closed with 3-0 chromic.  In the mandible, a similar procedure was performed from premolar to premolar right  to left side using the same instruments.  The tissue was trimmed.  The topical sponge was placed in the incision after alveoplasty was performed.  Then, the area was sutured with 3-0 chromic.  The oral cavity was then irrigated and suctioned and throat  pack was removed.  The patient was left in  care of Anesthesia for transport to recovery room with plans for transportation back to the room.  ESTIMATED BLOOD LOSS:  50 mL  COMPLICATIONS:  None.  SPECIMENS:  None.  TN/NUANCE  D:07/26/2019 T:07/27/2019 JOB:[id number]

## 2019-07-27 NOTE — TOC Progression Note (Addendum)
Transition of Care (TOC) - Progression Note    Patient Details  Name: [facility]. MRN: [id number] Date of Birth: 25-Jul-1946  Transition of Care [facility]) CM/SW Contact  Nila Nephew, [place] Phone Number: [phone] 07/27/2019, 10:07 AM  Clinical Narrative:   Pt's wife has selected [facility] SNF- notified facility representative, facility will initiate insurance authorization request. Pt will need COVID test result within 48 hours of DC. Per facility needs updated therapy notes for insurance authorization- will send when available        Expected Discharge Plan and Services           Expected Discharge Date: (unknown)                                     Social Determinants of Health (SDOH) Interventions    Readmission Risk Interventions No flowsheet data found.

## 2019-07-27 NOTE — Progress Notes (Signed)
PROGRESS NOTE    [facility].  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 Patient is poor historian. Wife could not pick up the phone. I reviewed his previous admissions. According to the patient, he has not been on blood thinners since he had that retroperitoneal bleed about 2 months ago. He had been otherwise doing well. He walks with a walker. Also has a history of seizure and is on multiple medications. He went to dentist and had almost all of his teeth removed on 07/08/2019, following that he had continued bleeding into his mouth and they had talked to their dentist who recommended to control with a gauze piece. His wife has been trying to feed him with Gatorade and chicken soup but otherwise he has not been able to eat much. Patient states that 2 days ago he fell and hit his head had some pain but improved now. The reason he came to the hospital today is feeling dizzy and blood pressure dropping on ambulation.   Assessment & Plan:   Principal Problem:   Anemia due to blood loss, acute Active Problems:   HTN (hypertension)   Coronary artery disease involving native heart   Automatic implantable cardioverter-defibrillator in situ   Seizure (HCC)   Renal insufficiency   Factor VII deficiency (HCC)   Chronic systolic CHF (congestive heart failure), NYHA class 2 (HCC)   Chronic kidney disease (CKD), stage III (moderate) (HCC)  Anemia due to acute blood loss, symptomatic anemia:      - s/p 1 unit pRBCs d/t symptomatic anemia; appropriate response (Hgb 8.3 - 9.3)     - oral bleeding from recent dental procedure (dental team consulted and is following)     - Increased bleeding recently, followed by dental service and oncology, appreciate input. Now s/p second round of 10 doses of amicar.  - Given FFP infusion per Oncology and 1 unit PRBC on 7/30 with post-transfusion hgb of 8.0 - Followed by Dr. Enrique Sack, recommendation for eval by oral surgeon, Dr. [name] - Hgb remains stable this AM. No further significant bleeding noted - Appreciate assistance by Dr. [name]. Underwent suture of oral incisions 8/2  Dizziness/lightheadedness     - multifactorial; in part d/t above     - seems resolved currently  Chronic congestive heart failure status post AICD:      - d/t hypotension, continued rate controlling medication including amiodarone and beta-blockers, but stopped Aldactone and nitrates as well as hydralazine     - seems stable at this time  Chronic atrial fibrillation     - Rate controlled on amiodarone and Coreg.  Unable to anticoagulate. -  Currently stable  History of seizure disorder:      - Unlikely recurrent seizure.       - Continued on multiple medications.       - Valproic acid level initially noted to be low, cont current regimen as tolerated  Chronic kidney disease stage III due to hypertension:      - At about his baseline; will cont to monitor    - repeat bmet in AM  Dementia -Pt more agitated this AM and initially refused blood transfusion -Discussed with pt's wife who states she makes medical decisions for pt -Updated wife on phone - stable at this time  DVT prophylaxis: SCD's Code Status: Full Family Communication: Pt in room, family not in room Disposition Plan: Possible d/c home when no longer bleeding and stable  Consultants:   Dental service  Hematology  Oral surgeon  Procedures:     Antimicrobials: Anti-infectives  (From admission, onward)   Start     Dose/Rate Route Frequency Ordered Stop   07/26/19 1602  ceFAZolin (ANCEF) 2-4 GM/100ML-% IVPB    Note to Pharmacy: Darlys Gales   : cabinet override      07/26/19 1602 07/26/19 1626   07/26/19 0845  ceFAZolin (ANCEF) IVPB 2g/100 mL premix     2 g 200 mL/hr over 30 Minutes Intravenous On call to O.R. 07/26/19 1751 07/26/19 1631   07/21/19 0845  amoxicillin (AMOXIL) capsule 500 mg     500 mg Oral Every 8 hours 07/21/19 0837 07/24/19 2203   07/20/19 2200  amoxicillin (AMOXIL) capsule 500 mg  Status:  Discontinued     500 mg Oral 2 times daily 07/20/19 1801 07/21/19 0837      Subjective: No complaints at this time  Objective: Vitals:   07/26/19 2000 07/26/19 2029 07/27/19 0426 07/27/19 1402  BP: (!) 149/109 (!) 134/99 (!) 142/77 104/69  Pulse:  99 89 81  Resp: 12 13 19 17   Temp: (!) 97.4 F (36.3 C) (!) 97.5 F (36.4 C) 97.9 F (36.6 C) 98.4 F (36.9 C)  TempSrc:  Axillary Axillary Oral  SpO2: 100%  100% 100%  Weight:   95.5 kg   Height:        Intake/Output Summary (Last 24 hours) at 07/27/2019 1407 Last data filed at 07/27/2019 0900 Gross per 24 hour  Intake 1180.66 ml  Output 475 ml  Net 705.66 ml   Filed Weights   07/25/19 0621 07/26/19 0511 07/27/19 0426  Weight: 91.3 kg 93.3 kg 95.5 kg    Examination: General exam: Conversant, in no acute distress Respiratory system: normal chest rise, clear, no audible wheezing Cardiovascular system: regular rhythm, s1-s2 Gastrointestinal system: Nondistended, nontender, pos BS Central nervous system: No seizures, no tremors Extremities: No cyanosis, no joint deformities Skin: No rashes, no pallor Psychiatry: Affect normal // no auditory hallucinations   Data Reviewed: I have personally reviewed following labs and imaging studies  CBC: Recent Labs  Lab 07/22/19 0613  07/24/19 0928 07/25/19 0440 07/25/19 1014 07/25/19 2022 07/26/19 0646 07/27/19 0317  WBC 6.3   < > 6.0 5.8 4.7   --  7.0 11.3*  NEUTROABS 4.2  --   --   --   --   --   --   --   HGB 8.8*   < > 8.0* 7.3* 6.5* 7.9* 8.0* 8.2*  HCT 28.0*   < > 25.8* 23.3* 20.7* 24.5* 25.1* 26.9*  MCV 97.6   < > 97.0 97.1 98.1  --  93.7 94.7  PLT 149*   < > 191 202 194  --  206 263   < > = values in this interval not displayed.   Basic Metabolic Panel: Recent Labs  Lab 07/22/19 0613 07/24/19 0928 07/25/19 0440 07/25/19 1014 07/26/19 0831  NA 135 136 135 132* 135  K 4.4 3.5 4.3 4.7 4.1  CL 103 107 102 103 103  CO2 24 24 26  21* 26  GLUCOSE 85 108*  84 388* 103*  BUN 52* 24* 24* 26* 20  CREATININE 1.51* 1.14 1.25* 1.56* 1.20  CALCIUM 8.4* 7.4* 7.9* 7.7* 8.1*  MG 2.2  --   --   --   --   PHOS 4.1  --   --   --   --    GFR: Estimated Creatinine Clearance: 67.6 mL/min (by C-G formula based on SCr of 1.2 mg/dL). Liver Function Tests: Recent Labs  Lab 07/22/19 0613  ALBUMIN 3.1*   No results for input(s): LIPASE, AMYLASE in the last 168 hours. No results for input(s): AMMONIA in the last 168 hours. Coagulation Profile: Recent Labs  Lab 07/22/19 1224  INR 1.2   Cardiac Enzymes: No results for input(s): CKTOTAL, CKMB, CKMBINDEX, TROPONINI in the last 168 hours. BNP (last 3 results) Recent Labs    11/26/18 1025  PROBNP 56.0   HbA1C: Recent Labs    07/25/19 2022  HGBA1C 5.2   CBG: Recent Labs  Lab 07/26/19 2359 07/27/19 0426 07/27/19 0750 07/27/19 0838 07/27/19 1115  GLUCAP 114* 100* 52* 85 128*   Lipid Profile: No results for input(s): CHOL, HDL, LDLCALC, TRIG, CHOLHDL, LDLDIRECT in the last 72 hours. Thyroid Function Tests: No results for input(s): TSH, T4TOTAL, FREET4, T3FREE, THYROIDAB in the last 72 hours. Anemia Panel: No results for input(s): VITAMINB12, FOLATE, FERRITIN, TIBC, IRON, RETICCTPCT in the last 72 hours. Sepsis Labs: No results for input(s): PROCALCITON, LATICACIDVEN in the last 168 hours.  Recent Results (from the past 240 hour(s))  SARS Coronavirus 2 (CEPHEID -  Performed in [place] hospital lab), Hosp Order     Status: None   Collection Time: 07/20/19  1:58 PM   Specimen: Nasopharyngeal Swab  Result Value Ref Range Status   SARS Coronavirus 2 NEGATIVE NEGATIVE Final    Comment: (NOTE) If result is NEGATIVE SARS-CoV-2 target nucleic acids are NOT DETECTED. The SARS-CoV-2 RNA is generally detectable in upper and lower  respiratory specimens during the acute phase of infection. The lowest  concentration of SARS-CoV-2 viral copies this assay can detect is 250  copies / mL. A negative result does not preclude SARS-CoV-2 infection  and should not be used as the sole basis for treatment or other  patient management decisions.  A negative result may occur with  improper specimen collection / handling, submission of specimen other  than nasopharyngeal swab, presence of viral mutation(s) within the  areas targeted by this assay, and inadequate number of viral copies  (<250 copies / mL). A negative result must be combined with clinical  observations, patient history, and epidemiological information. If result is POSITIVE SARS-CoV-2 target nucleic acids are DETECTED. The SARS-CoV-2 RNA is generally detectable in upper and lower  respiratory specimens dur ing the acute phase of infection.  Positive  results are indicative of active infection with SARS-CoV-2.  Clinical  correlation with patient history and other diagnostic information is  necessary to determine patient infection status.  Positive results do  not rule out bacterial infection or co-infection with other viruses. If result is PRESUMPTIVE POSTIVE SARS-CoV-2 nucleic acids MAY BE PRESENT.   A presumptive positive result was obtained on the submitted specimen  and confirmed on repeat testing.  While 2019 novel coronavirus  (SARS-CoV-2) nucleic acids may be present in the submitted sample  additional confirmatory testing may be necessary for epidemiological  and / or clinical management  purposes  to differentiate between  SARS-CoV-2 and other Sarbecovirus currently known to infect humans.  If clinically indicated  additional testing with an alternate test  methodology [phone]) is advised. The SARS-CoV-2 RNA is generally  detectable in upper and lower respiratory sp ecimens during the acute  phase of infection. The expected result is Negative. Fact Sheet for Patients:  StrictlyIdeas.no Fact Sheet for Healthcare Providers: BankingDealers.co.za This test is not yet approved or cleared by the [place] FDA and has been authorized for detection and/or diagnosis of SARS-CoV-2 by FDA under an Emergency Use Authorization (EUA).  This EUA will remain in effect (meaning this test can be used) for the duration of the COVID-19 declaration under Section 564(b)(1) of the Act, 21 U.S.C. section 360bbb-3(b)(1), unless the authorization is terminated or revoked sooner. Performed at [facility], [place] [address] [place], [place] [address]      Radiology Studies: Dg Chest Port 1 View  Result Date: 07/25/2019 CLINICAL DATA:  Patient became unresponsive with exertion. EXAM: PORTABLE CHEST 1 VIEW COMPARISON:  Chest CT, 04/21/2019.  Chest radiographs, 12/07/2019. FINDINGS: There stable changes from prior cardiac surgery. Cardiac silhouette is mildly enlarged. No mediastinal or hilar masses. Lungs are clear.  No pleural effusion or pneumothorax. Right anterior chest wall single lead pacemaker is stable. Skeletal structures are grossly intact. IMPRESSION: No acute cardiopulmonary disease. Electronically Signed   By: Lajean Manes M.D.   On: 07/25/2019 16:57   [place] Ekg Site Rite  Result Date: 07/25/2019 If Site Rite image not attached, placement could not be confirmed due to current cardiac rhythm.   Scheduled Meds: . amiodarone  200 mg Oral Daily  . divalproex  500 mg Oral Daily  . feeding supplement (ENSURE ENLIVE)  237 mL  Oral TID BM  . insulin aspart  0-9 Units Subcutaneous Q4H  . phenytoin (DILANTIN) IV  100 mg Intravenous Q8H  . tamsulosin  0.4 mg Oral Daily   Continuous Infusions: . dextrose 5 % and 0.9% NaCl 50 mL/hr at 07/26/19 0817  . levETIRAcetam       LOS: 6 days   Marylu Lund, MD [facility] Pager On Amion  If 7PM-7AM, please contact night-coverage 07/27/2019, 2:07 PM

## 2019-07-27 NOTE — Care Management Important Message (Signed)
Important Message  Patient Details IM Letter given to [name] SW to present to the Patient Name: [facility]. MRN: [id number] Date of Birth: June 30, 1946   Medicare Important Message Given:  Yes     Louis Ford 07/27/2019, 12:43 PM

## 2019-07-27 NOTE — Progress Notes (Signed)
[facility].   DOB:1946/02/22   BT#:[id number]   XTG#:[id number]  Hematology follow up note   Subjective: Pt underwent resuture oral wounds yesterday, complains oral pain, bleeding has slowed down since surgery, H/H stable this morning.  Objective:  Vitals:   07/27/19 0426 07/27/19 1402  BP: (!) 142/77 104/69  Pulse: 89 81  Resp: 19 17  Temp: 97.9 F (36.6 C) 98.4 F (36.9 C)  SpO2: 100% 100%    Body mass index is 29.38 kg/m.  Intake/Output Summary (Last 24 hours) at 07/27/2019 1720 Last data filed at 07/27/2019 1330 Gross per 24 hour  Intake 1540.66 ml  Output 775 ml  Net 765.66 ml     Sclerae unicteric  Skin; no petechia or ecchymosis  Oral exam showed his gauze in mouth are saturated with blood and blood clots, mild bleeding at dental extraction sites and a few blood clots     CBG (last 3)  Recent Labs    07/27/19 0838 07/27/19 1115 07/27/19 1540  GLUCAP 85 128* 116*     Labs:  Lab Results  Component Value Date   WBC 11.3 (H) 07/27/2019   HGB 8.2 (L) 07/27/2019   HCT 26.9 (L) 07/27/2019   MCV 94.7 07/27/2019   PLT 263 07/27/2019   NEUTROABS 4.2 07/22/2019   Urine Studies No results for input(s): UHGB, CRYS in the last 72 hours.  Invalid input(s): UACOL, UAPR, USPG, UPH, UTP, UGL, UKET, UBIL, UNIT, UROB, [place], UEPI, UWBC, Duwayne Heck [place], [place]  Basic Metabolic Panel: Recent Labs  Lab 07/22/19 [phone] 07/24/19 0928 07/25/19 0440 07/25/19 1014 07/26/19 0831  NA 135 136 135 132* 135  K 4.4 3.5 4.3 4.7 4.1  CL 103 107 102 103 103  CO2 24 24 26  21* 26  GLUCOSE 85 108* 84 388* 103*  BUN 52* 24* 24* 26* 20  CREATININE 1.51* 1.14 1.25* 1.56* 1.20  CALCIUM 8.4* 7.4* 7.9* 7.7* 8.1*  MG 2.2  --   --   --   --   PHOS 4.1  --   --   --   --    GFR Estimated Creatinine Clearance: 67.6 mL/min (by C-G formula based on SCr of 1.2 mg/dL). Liver Function Tests: Recent Labs  Lab 07/22/19 0613  ALBUMIN 3.1*   No results for input(s): LIPASE, AMYLASE  in the last 168 hours. No results for input(s): AMMONIA in the last 168 hours. Coagulation profile Recent Labs  Lab 07/22/19 1224  INR 1.2    CBC: Recent Labs  Lab 07/22/19 0613  07/24/19 0928 07/25/19 0440 07/25/19 1014 07/25/19 2022 07/26/19 0646 07/27/19 0317  WBC 6.3   < > 6.0 5.8 4.7  --  7.0 11.3*  NEUTROABS 4.2  --   --   --   --   --   --   --   HGB 8.8*   < > 8.0* 7.3* 6.5* 7.9* 8.0* 8.2*  HCT 28.0*   < > 25.8* 23.3* 20.7* 24.5* 25.1* 26.9*  MCV 97.6   < > 97.0 97.1 98.1  --  93.7 94.7  PLT 149*   < > 191 202 194  --  206 263   < > = values in this interval not displayed.   Cardiac Enzymes: No results for input(s): CKTOTAL, CKMB, CKMBINDEX, TROPONINI in the last 168 hours. BNP: Invalid input(s): POCBNP CBG: Recent Labs  Lab 07/27/19 0426 07/27/19 0750 07/27/19 0838 07/27/19 1115 07/27/19 1540  GLUCAP 100* 52* 85 128* 116*  D-Dimer No results for input(s): DDIMER in the last 72 hours. Hgb A1c Recent Labs    07/25/19 2022  HGBA1C 5.2   Lipid Profile No results for input(s): CHOL, HDL, LDLCALC, TRIG, CHOLHDL, LDLDIRECT in the last 72 hours. Thyroid function studies No results for input(s): TSH, T4TOTAL, T3FREE, THYROIDAB in the last 72 hours.  Invalid input(s): FREET3 Anemia work up No results for input(s): VITAMINB12, FOLATE, FERRITIN, TIBC, IRON, RETICCTPCT in the last 72 hours. Microbiology Recent Results (from the past 240 hour(s))  SARS Coronavirus 2 (CEPHEID - Performed in [place] hospital lab), Hosp Order     Status: None   Collection Time: 07/20/19  1:58 PM   Specimen: Nasopharyngeal Swab  Result Value Ref Range Status   SARS Coronavirus 2 NEGATIVE NEGATIVE Final    Comment: (NOTE) If result is NEGATIVE SARS-CoV-2 target nucleic acids are NOT DETECTED. The SARS-CoV-2 RNA is generally detectable in upper and lower  respiratory specimens during the acute phase of infection. The lowest  concentration of SARS-CoV-2 viral copies this  assay can detect is 250  copies / mL. A negative result does not preclude SARS-CoV-2 infection  and should not be used as the sole basis for treatment or other  patient management decisions.  A negative result may occur with  improper specimen collection / handling, submission of specimen other  than nasopharyngeal swab, presence of viral mutation(s) within the  areas targeted by this assay, and inadequate number of viral copies  (<250 copies / mL). A negative result must be combined with clinical  observations, patient history, and epidemiological information. If result is POSITIVE SARS-CoV-2 target nucleic acids are DETECTED. The SARS-CoV-2 RNA is generally detectable in upper and lower  respiratory specimens dur ing the acute phase of infection.  Positive  results are indicative of active infection with SARS-CoV-2.  Clinical  correlation with patient history and other diagnostic information is  necessary to determine patient infection status.  Positive results do  not rule out bacterial infection or co-infection with other viruses. If result is PRESUMPTIVE POSTIVE SARS-CoV-2 nucleic acids MAY BE PRESENT.   A presumptive positive result was obtained on the submitted specimen  and confirmed on repeat testing.  While 2019 novel coronavirus  (SARS-CoV-2) nucleic acids may be present in the submitted sample  additional confirmatory testing may be necessary for epidemiological  and / or clinical management purposes  to differentiate between  SARS-CoV-2 and other Sarbecovirus currently known to infect humans.  If clinically indicated additional testing with an alternate test  methodology [phone]) is advised. The SARS-CoV-2 RNA is generally  detectable in upper and lower respiratory sp ecimens during the acute  phase of infection. The expected result is Negative. Fact Sheet for Patients:  StrictlyIdeas.no Fact Sheet for Healthcare  Providers: BankingDealers.co.za This test is not yet approved or cleared by the [place] FDA and has been authorized for detection and/or diagnosis of SARS-CoV-2 by FDA under an Emergency Use Authorization (EUA).  This EUA will remain in effect (meaning this test can be used) for the duration of the COVID-19 declaration under Section 564(b)(1) of the Act, 21 U.S.C. section 360bbb-3(b)(1), unless the authorization is terminated or revoked sooner. Performed at [facility], [place] [address] [place], [place] [address]       Studies:  No results found.  Assessment: 73 y.o.   1.  Postop bleeding from extensive dental extraction, s/p resuture oral wounds 8/2 2.  History of factor VII deficiency, level was 32-60%  in 2013  3. Anemia of blood loss  4. CHF with AICD 5. AF 6. Seizure disorder  7. CKD    Plan:  -His mouth bleeding has slowed down since surgery yesterday, H/H stable this morning -continue amicar mouth wash as needed -blood transfusion if Hg<8.0 -will f/u.    Truitt Merle, MD 07/23/2019

## 2019-07-27 NOTE — Progress Notes (Signed)
[facility]. PROGRESS NOTE:   SUBJECTIVE: Resting in bed. C/o pain. Refuses to take oral meds  OBJECTIVE:  Vitals: Blood pressure (!) 142/77, pulse 89, temperature 97.9 F (36.6 C), temperature source Axillary, resp. rate 19, height 5' 10.98" (1.803 m), weight 95.5 kg, SpO2 100 %. Lab results: Results for orders placed or performed during the hospital encounter of 07/20/19 (from the past 24 hour(s))  Glucose, capillary     Status: None   Collection Time: 07/26/19  8:00 AM  Result Value Ref Range   Glucose-Capillary 91 70 - 99 mg/dL  Basic metabolic panel     Status: Abnormal   Collection Time: 07/26/19  8:31 AM  Result Value Ref Range   Sodium 135 135 - 145 mmol/L   Potassium 4.1 3.5 - 5.1 mmol/L   Chloride 103 98 - 111 mmol/L   CO2 26 22 - 32 mmol/L   Glucose, Bld 103 (H) 70 - 99 mg/dL   BUN 20 8 - 23 mg/dL   Creatinine, Ser 1.20 0.61 - 1.24 mg/dL   Calcium 8.1 (L) 8.9 - 10.3 mg/dL   GFR calc non Af Amer >60 >60 mL/min   GFR calc Af Amer >60 >60 mL/min   Anion gap 6 5 - 15  Glucose, capillary     Status: None   Collection Time: 07/26/19 11:55 AM  Result Value Ref Range   Glucose-Capillary 88 70 - 99 mg/dL  Glucose, capillary     Status: None   Collection Time: 07/26/19  2:41 PM  Result Value Ref Range   Glucose-Capillary 76 70 - 99 mg/dL  Glucose, capillary     Status: Abnormal   Collection Time: 07/26/19  3:08 PM  Result Value Ref Range   Glucose-Capillary 114 (H) 70 - 99 mg/dL  Glucose, capillary     Status: None   Collection Time: 07/26/19  6:38 PM  Result Value Ref Range   Glucose-Capillary 90 70 - 99 mg/dL  Glucose, capillary     Status: None   Collection Time: 07/26/19  8:26 PM  Result Value Ref Range   Glucose-Capillary 99 70 - 99 mg/dL  Glucose, capillary     Status: Abnormal   Collection Time: 07/26/19 10:06 PM  Result Value Ref Range   Glucose-Capillary 110 (H) 70 - 99 mg/dL  Glucose, capillary     Status: Abnormal   Collection Time: 07/26/19 11:59  PM  Result Value Ref Range   Glucose-Capillary 114 (H) 70 - 99 mg/dL  CBC     Status: Abnormal   Collection Time: 07/27/19  3:17 AM  Result Value Ref Range   WBC 11.3 (H) 4.0 - 10.5 K/uL   RBC 2.84 (L) 4.22 - 5.81 MIL/uL   Hemoglobin 8.2 (L) 13.0 - 17.0 g/dL   HCT 26.9 (L) 39.0 - 52.0 %   MCV 94.7 80.0 - 100.0 fL   MCH 28.9 26.0 - 34.0 pg   MCHC 30.5 30.0 - 36.0 g/dL   RDW 15.5 11.5 - 15.5 %   Platelets 263 150 - 400 K/uL   nRBC 0.4 (H) 0.0 - 0.2 %  Glucose, capillary     Status: Abnormal   Collection Time: 07/27/19  4:26 AM  Result Value Ref Range   Glucose-Capillary 100 (H) 70 - 99 mg/dL   Radiology Results: Dg Chest Port 1 View  Result Date: 07/25/2019 CLINICAL DATA:  Patient became unresponsive with exertion. EXAM: PORTABLE CHEST 1 VIEW COMPARISON:  Chest CT, 04/21/2019.  Chest radiographs, 12/07/2019.  FINDINGS: There stable changes from prior cardiac surgery. Cardiac silhouette is mildly enlarged. No mediastinal or hilar masses. Lungs are clear.  No pleural effusion or pneumothorax. Right anterior chest wall single lead pacemaker is stable. Skeletal structures are grossly intact. IMPRESSION: No acute cardiopulmonary disease. Electronically Signed   By: Lajean Manes M.D.   On: 07/25/2019 16:57   [place] Ekg Site Rite  Result Date: 07/25/2019 If Site Rite image not attached, placement could not be confirmed due to current cardiac rhythm.  General appearance: cooperative and no distress Head: Normocephalic, without obvious abnormality, atraumatic Eyes: negative Nose: Nares normal. Septum midline. Mucosa normal. No drainage or sinus tenderness. Throat: sutures intact. No active bleeding. submucosal ecchymosis. Pharynx clear Neck: no adenopathy  ASSESSMENT: Stable s/p resuture oral wounds with alveoloplasty.   PLAN: IV morphine ordered for pain. Continue Amicar prn. Will follow.   [name] 07/27/2019

## 2019-07-28 LAB — CBC
HCT: 24.2 % — ABNORMAL LOW (ref 39.0–52.0)
Hemoglobin: 7.6 g/dL — ABNORMAL LOW (ref 13.0–17.0)
MCH: 30.3 pg (ref 26.0–34.0)
MCHC: 31.4 g/dL (ref 30.0–36.0)
MCV: 96.4 fL (ref 80.0–100.0)
Platelets: 236 10*3/uL (ref 150–400)
RBC: 2.51 MIL/uL — ABNORMAL LOW (ref 4.22–5.81)
RDW: 15.4 % (ref 11.5–15.5)
WBC: 7.4 10*3/uL (ref 4.0–10.5)
nRBC: 0.3 % — ABNORMAL HIGH (ref 0.0–0.2)

## 2019-07-28 LAB — GLUCOSE, CAPILLARY
Glucose-Capillary: 105 mg/dL — ABNORMAL HIGH (ref 70–99)
Glucose-Capillary: 107 mg/dL — ABNORMAL HIGH (ref 70–99)
Glucose-Capillary: 116 mg/dL — ABNORMAL HIGH (ref 70–99)
Glucose-Capillary: 84 mg/dL (ref 70–99)
Glucose-Capillary: 85 mg/dL (ref 70–99)
Glucose-Capillary: 99 mg/dL (ref 70–99)

## 2019-07-28 LAB — BASIC METABOLIC PANEL
Anion gap: 3 — ABNORMAL LOW (ref 5–15)
BUN: 11 mg/dL (ref 8–23)
CO2: 27 mmol/L (ref 22–32)
Calcium: 7.9 mg/dL — ABNORMAL LOW (ref 8.9–10.3)
Chloride: 106 mmol/L (ref 98–111)
Creatinine, Ser: 1.04 mg/dL (ref 0.61–1.24)
GFR calc Af Amer: >60 mL/min (ref >60)
GFR calc non Af Amer: >60 mL/min (ref >60)
Glucose, Bld: 99 mg/dL (ref 70–99)
Potassium: 4.4 mmol/L (ref 3.5–5.1)
Sodium: 136 mmol/L (ref 135–145)

## 2019-07-28 MED ORDER — LEVETIRACETAM 500 MG PO TABS
1000.0000 mg | ORAL_TABLET | Freq: Two times a day (BID) | ORAL | Status: DC
  Administered 2019-07-28 – 2019-08-15 (×35): 1000 mg via ORAL
  Filled 2019-07-28 (×35): qty 2

## 2019-07-28 MED ORDER — LABETALOL HCL 5 MG/ML IV SOLN
5.0000 mg | INTRAVENOUS | Status: DC | PRN
  Administered 2019-08-16: 5 mg via INTRAVENOUS
  Filled 2019-07-28 (×3): qty 4

## 2019-07-28 NOTE — Discharge Instructions (Signed)
Nutrition Post Hospital Stay Proper nutrition can help your body recover from illness and injury.   Foods and beverages high in protein, vitamins, and minerals help rebuild muscle loss, promote healing, & reduce fall risk.   .In addition to eating healthy foods, a nutrition shake is an easy, delicious way to get the nutrition you need during and after your hospital stay  It is recommended that you continue to drink 2-3 bottles per day of:       Ensure or Boost for at least 1 month (30 days) after your hospital stay   Tips for adding a nutrition shake into your routine: As allowed, drink one with vitamins or medications instead of water or juice Enjoy one as a tasty mid-morning or afternoon snack Drink cold or make a milkshake out of it Drink one instead of milk with cereal or snacks Use as a coffee creamer   Available at the following grocery stores and pharmacies:           * [employer] * [employer] * [employer]co  * Rite Aid          * Walmart * Sam's Club  * Walgreens      * Target  * BJ's   * CVS  * [employer]   * [facility] [phone]            For COUPONS visit: [email or web address]/join or [email or web address]/[email or web address]-[email or web address]   Suggested Substitutions Ensure Plus = Boost Plus = Carnation Breakfast Essentials = Boost Compact Ensure Active Clear = Boost Breeze Glucerna Shake = Boost Glucose Control = Carnation Breakfast Essentials SUGAR FREE     

## 2019-07-28 NOTE — Progress Notes (Signed)
PT Cancellation Note  Patient Details Name: [facility]. MRN: [id number] DOB: [birth year]   Cancelled Treatment:    Reason Eval/Treat Not Completed: Pain limiting ability to participate Pt reports knee pain and states he has received pain meds however does not "feel up to" mobilizing at this time.  Will check back as schedule permits.   Nevaeh Korte,KATHrine E 07/28/2019, 3:10 PM Carmelia Bake, PT, DPT Acute Rehabilitation Services Office: [phone] Pager: [phone]

## 2019-07-28 NOTE — Progress Notes (Signed)
Pharmacy IV to PO conversion  The patient is receiving levetiracetam by the intravenous route.  Based on criteria approved by the Pharmacy and [place], the medication is being converted to the equivalent oral dose form.   No active GI bleeding or impaired absorption  Not s/p esophagectomy  Documented ability to take oral medications for > 24 hr  Plan to continue treatment for at least 1 day  If you have any questions about this conversion, please contact the Pharmacy Department (ext [phone]).  Thank you.  Reuel Boom, PharmD, BCPS [phone] 07/28/2019, 11:52 AM

## 2019-07-28 NOTE — Progress Notes (Signed)
Occupational Therapy Treatment Patient Details Name: [facility]. MRN: [id number] DOB: [birth year] Today's Date: 07/28/2019    History of present illness [facility]. is a 73 y.o. 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   OT comments  Limited tx due to knee pain:  Pt only agreeable to sitting EOB.  Follow Up Recommendations  SNF;Supervision/Assistance - 24 hour    Equipment Recommendations  None recommended by OT    Recommendations for Other Services      Precautions / Restrictions Precautions Precautions: Fall Precaution Comments: monitor orthostatics Restrictions Weight Bearing Restrictions: No       Mobility Bed Mobility         Supine to sit: Supervision Sit to supine: Supervision   General bed mobility comments: pt used arms to self assist RLE  Transfers                      Balance                                           ADL either performed or assessed with clinical judgement   ADL                                         General ADL Comments: pt did not want to stand nor transfer to chair due to knee pain (moreso than mouth pain.  Did not want to perform ADL this morning.  Pt worked on leg exercises sitting     Vision       Perception     Praxis      Cognition Arousal/Alertness: Awake/alert Behavior During Therapy: WFL for tasks assessed/performed Overall Cognitive Status: Within Functional Limits for tasks assessed                                          Exercises      Shoulder Instructions       General Comments      Pertinent Vitals/ Pain       Pain Assessment: 0-10 Faces Pain Scale: Hurts even more Pain Location: R knee Pain Descriptors / Indicators: Aching Pain Intervention(s): Limited activity within patient's tolerance;Monitored during session;Premedicated before session;Repositioned  Home Living                                          Prior Functioning/Environment              Frequency  Min 2X/week        Progress Toward Goals  OT Goals(current goals can now be found in the care plan section)  Progress towards OT goals: Progressing toward goals(limited by pain)     Plan      Co-evaluation  AM-PAC OT "6 Clicks" Daily Activity     Outcome Measure   Help from another person eating meals?: None Help from another person taking care of personal grooming?: A Little Help from another person toileting, which includes using toliet, bedpan, or urinal?: A Lot Help from another person bathing (including washing, rinsing, drying)?: A Lot Help from another person to put on and taking off regular upper body clothing?: A Little Help from another person to put on and taking off regular lower body clothing?: A Lot 6 Click Score: 16    End of Session    OT Visit Diagnosis: Unsteadiness on feet (R26.81);Other abnormalities of gait and mobility (R26.89);Muscle weakness (generalized) (M62.81);History of falling (Z91.81)   Activity Tolerance     Patient Left in bed;with call bell/phone within reach;with bed alarm set   Nurse Communication          Time: 1779-3903 OT Time Calculation (min): 23 min  Charges: OT General Charges $OT Visit: 1 Visit OT Treatments $Therapeutic Activity: 8-22 mins  Louis Ford, OTR/L Acute Rehabilitation Services [phone] [place] pager [phone] office 07/28/2019   Louis Ford 07/28/2019, 10:03 AM

## 2019-07-28 NOTE — Progress Notes (Signed)
[facility]. PROGRESS NOTE:   SUBJECTIVE: resting, no distress  OBJECTIVE:  Vitals: Blood pressure (!) 94/58, pulse 81, temperature 98.3 F (36.8 C), temperature source Oral, resp. rate 17, height 5' 10.98" (1.803 m), weight 96.7 kg, SpO2 100 %. Lab results: Results for orders placed or performed during the hospital encounter of 07/20/19 (from the past 24 hour(s))  Glucose, capillary     Status: Abnormal   Collection Time: 07/27/19  7:50 AM  Result Value Ref Range   Glucose-Capillary 52 (L) 70 - 99 mg/dL   Comment 1 Notify RN    Comment 2 Document in Chart   Glucose, capillary     Status: None   Collection Time: 07/27/19  8:38 AM  Result Value Ref Range   Glucose-Capillary 85 70 - 99 mg/dL   Comment 1 Notify RN    Comment 2 Document in Chart   Glucose, capillary     Status: Abnormal   Collection Time: 07/27/19 11:15 AM  Result Value Ref Range   Glucose-Capillary 128 (H) 70 - 99 mg/dL   Comment 1 Notify RN    Comment 2 Document in Chart   Glucose, capillary     Status: Abnormal   Collection Time: 07/27/19  3:40 PM  Result Value Ref Range   Glucose-Capillary 116 (H) 70 - 99 mg/dL   Comment 1 Notify RN    Comment 2 Document in Chart   Glucose, capillary     Status: Abnormal   Collection Time: 07/27/19  8:18 PM  Result Value Ref Range   Glucose-Capillary 100 (H) 70 - 99 mg/dL   Comment 1 Notify RN    Comment 2 Document in Chart   Glucose, capillary     Status: Abnormal   Collection Time: 07/28/19 12:25 AM  Result Value Ref Range   Glucose-Capillary 105 (H) 70 - 99 mg/dL  Glucose, capillary     Status: None   Collection Time: 07/28/19  4:36 AM  Result Value Ref Range   Glucose-Capillary 85 70 - 99 mg/dL   Comment 1 Notify RN    Comment 2 Document in Chart   CBC     Status: Abnormal   Collection Time: 07/28/19  6:04 AM  Result Value Ref Range   WBC 7.4 4.0 - 10.5 K/uL   RBC 2.51 (L) 4.22 - 5.81 MIL/uL   Hemoglobin 7.6 (L) 13.0 - 17.0 g/dL   HCT 24.2 (L) 39.0 -  52.0 %   MCV 96.4 80.0 - 100.0 fL   MCH 30.3 26.0 - 34.0 pg   MCHC 31.4 30.0 - 36.0 g/dL   RDW 15.4 11.5 - 15.5 %   Platelets 236 150 - 400 K/uL   nRBC 0.3 (H) 0.0 - 0.2 %  Basic metabolic panel     Status: Abnormal   Collection Time: 07/28/19  6:04 AM  Result Value Ref Range   Sodium 136 135 - 145 mmol/L   Potassium 4.4 3.5 - 5.1 mmol/L   Chloride 106 98 - 111 mmol/L   CO2 27 22 - 32 mmol/L   Glucose, Bld 99 70 - 99 mg/dL   BUN 11 8 - 23 mg/dL   Creatinine, Ser 1.04 0.61 - 1.24 mg/dL   Calcium 7.9 (L) 8.9 - 10.3 mg/dL   GFR calc non Af Amer >60 >60 mL/min   GFR calc Af Amer >60 >60 mL/min   Anion gap 3 (L) 5 - 15   Radiology Results: No results found. General appearance: alert, cooperative  and no distress Head: Normocephalic, without obvious abnormality, atraumatic Eyes: negative Nose: Nares normal. Septum midline. Mucosa normal. No drainage or sinus tenderness. Throat: Hemostatic. Submucal ecchymosis and edema. Sutures intact. pharynx cleear.  Neck: no adenopathy  ASSESSMENT: Hemostatic s/p resuture, alveoloplasty.   PLAN: Per hematology.   Louis Ford 07/28/2019

## 2019-07-28 NOTE — Progress Notes (Signed)
Nutrition Follow-up  INTERVENTION:   -Continue Ensure Enlive po TID, each supplement provides 350 kcal and 20 grams of protein  NUTRITION DIAGNOSIS:   Inadequate oral intake related to acute illness, mouth pain as evidenced by per patient/family report.  Ongoing.  GOAL:   Patient will meet greater than or equal to 90% of their needs  Progressing.  MONITOR:   PO intake, Supplement acceptance, Diet advancement, Labs, Weight trends  ASSESSMENT:   73 y.o. 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 He presented to the ED d/t lightheadedness and dizziness, bleeding from the mouth, and poor oral intakes. Patient denied being on blood thinners since retroperitoneal bleed ~2 months ago. He had 18 teeth removed by his dentist on 07/08/19 and following that time continued to have oral bleeding. 8/2: s/p resuture oral wounds with alveoloplasty  **RD working remotely**  Patient consuming ~50% of full liquid trays. Pt is drinking at least 2 Ensures a day.   Per weight records, weight is trending up. Pt weighed 199 lbs on 7/28, now weighs 213 lbs.   Medications reviewed. Labs reviewed: CBGs: 84-85  Diet Order:   Diet Order            Diet full liquid Room service appropriate? Yes; Fluid consistency: Thin  Diet effective now              EDUCATION NEEDS:   No education needs have been identified at this time  Skin:  Skin Assessment: Reviewed RN Assessment  Last BM:  8/1  Height:   Ht Readings from Last 1 Encounters:  07/20/19 5' 10.98" (1.803 m)    Weight:   Wt Readings from Last 1 Encounters:  07/28/19 96.7 kg    Ideal Body Weight:  78.2 kg  BMI:  Body mass index is 29.73 kg/m.  Estimated Nutritional Needs:   Kcal:  1820-2085 kcal  Protein:  80-90 grams  Fluid:   >/= 2 L/day  Clayton Bibles, MS, RD, LDN [place] Dietitian Pager: [phone] After Hours Pager: [phone]

## 2019-07-28 NOTE — Progress Notes (Signed)
MD notified of pt's HR. Orders placed. Will continue to monitor.

## 2019-07-28 NOTE — Progress Notes (Signed)
PROGRESS NOTE    [facility].  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 Patient is poor historian. Wife could not pick up the phone. I reviewed his previous admissions. According to the patient, he has not been on blood thinners since he had that retroperitoneal bleed about 2 months ago. He had been otherwise doing well. He walks with a walker. Also has a history of seizure and is on multiple medications. He went to dentist and had almost all of his teeth removed on 07/08/2019, following that he had continued bleeding into his mouth and they had talked to their dentist who recommended to control with a gauze piece. His wife has been trying to feed him with Gatorade and chicken soup but otherwise he has not been able to eat much. Patient states that 2 days ago he fell and hit his head had some pain but improved now. The reason he came to the hospital today is feeling dizzy and blood pressure dropping on ambulation.   Assessment & Plan:   Principal Problem:   Anemia due to blood loss, acute Active Problems:   HTN (hypertension)   Coronary artery disease involving native heart   Automatic implantable cardioverter-defibrillator in situ   Seizure (HCC)   Renal insufficiency   Factor VII deficiency (HCC)   Chronic systolic CHF (congestive heart failure), NYHA class 2 (HCC)   Chronic kidney disease (CKD), stage III (moderate) (HCC)  Anemia due to acute blood loss, symptomatic anemia:      - s/p 1 unit pRBCs d/t symptomatic anemia; appropriate response     - oral bleeding from recent dental procedure  - Had been given FFP this admit with multiple doses of oral amicar - Hgb overall stable, now 7.6 from over 8 yesterday - Appreciate assistance by Dr. [name]. Underwent suture of oral incisions 8/2 -Will repeat CBC in AM. Tx if hgb <7  Dizziness/lightheadedness     - multifactorial; in part d/t above     - seems resolved presently  Chronic congestive heart failure status post AICD:      - continued rate controlling medication including amiodarone, but stopped Aldactone and nitrates as well as hydralazine given hypotension     - coreg was stopped later secondary to hypotensive episode  Chronic atrial fibrillation     - Had been rate controlled on amiodarone.  - Coreg was held secondary to hypotension -HR now in the 130's. Will cautiously resume beta blocker  - Unable to anticoagulate secondary to bleeding above  History of seizure disorder:      - Unlikely recurrent seizure.       - Continued on multiple medications.       - Valproic acid level initially noted to be low, cont current regimen as tolerated  Chronic kidney disease stage III due to hypertension:      - At about his baseline; will cont to monitor    - recheck bmet in AM  Dementia -Earlier discussed with pt's wife who states she makes medical decisions for pt -Stable currently  DVT prophylaxis: SCD's Code Status: Full  Family Communication: Pt in room, family not in room Disposition Plan: Possible d/c home when no longer bleeding and stable  Consultants:   Dental service  Hematology  Oral surgeon  Procedures:  suture of oral incisions 8/2 by Dr. [name]  Antimicrobials: Anti-infectives (From admission, onward)   Start     Dose/Rate Route Frequency Ordered Stop   07/26/19 1602  ceFAZolin (ANCEF) 2-4 GM/100ML-% IVPB    Note to Pharmacy: Darlys Gales   : cabinet override       07/26/19 1602 07/26/19 1626   07/26/19 0845  ceFAZolin (ANCEF) IVPB 2g/100 mL premix     2 g 200 mL/hr over 30 Minutes Intravenous On call to O.R. 07/26/19 4818 07/26/19 1631   07/21/19 0845  amoxicillin (AMOXIL) capsule 500 mg     500 mg Oral Every 8 hours 07/21/19 0837 07/24/19 2203   07/20/19 2200  amoxicillin (AMOXIL) capsule 500 mg  Status:  Discontinued     500 mg Oral 2 times daily 07/20/19 1801 07/21/19 0837      Subjective: Complaining of continued oral pain s/p suture of incisions   Objective: Vitals:   07/27/19 2016 07/28/19 0442 07/28/19 0500 07/28/19 1308  BP: 92/68 (!) 94/58  100/67  Pulse: 90 81  87  Resp: 17 17  20   Temp: 97.9 F (36.6 C) 98.3 F (36.8 C)  98.7 F (37.1 C)  TempSrc:  Oral    SpO2: 100%   100%  Weight:   96.7 kg   Height:        Intake/Output Summary (Last 24 hours) at 07/28/2019 1713 Last data filed at 07/28/2019 0300 Gross per 24 hour  Intake 200 ml  Output -  Net 200 ml   Filed Weights   07/26/19 0511 07/27/19 0426 07/28/19 0500  Weight: 93.3 kg 95.5 kg 96.7 kg    Examination: General exam: Awake, laying in bed, in nad Respiratory system: Normal respiratory effort, no wheezing Cardiovascular system: regular rate, s1, s2 Gastrointestinal system: Soft, nondistended, positive BS Central nervous system: CN2-12 grossly intact, strength intact Extremities: Perfused, no clubbing Skin: Normal skin turgor, no notable skin lesions seen Psychiatry: Mood normal // no visual hallucinations   Data Reviewed: I have personally reviewed following labs and imaging studies  CBC: Recent Labs  Lab 07/22/19 0613  07/25/19 0440 07/25/19 1014 07/25/19 2022 07/26/19 0646 07/27/19 0317 07/28/19 0604  WBC 6.3   < > 5.8 4.7  --  7.0 11.3* 7.4  NEUTROABS 4.2  --   --   --   --   --   --   --   HGB 8.8*   < > 7.3* 6.5* 7.9* 8.0* 8.2* 7.6*  HCT 28.0*   < > 23.3* 20.7* 24.5* 25.1* 26.9* 24.2*  MCV 97.6   < > 97.1 98.1  --  93.7 94.7 96.4  PLT 149*    < > 202 194  --  206 263 236   < > = values in this interval not displayed.   Basic Metabolic Panel: Recent Labs  Lab 07/22/19 0613 07/24/19 0928 07/25/19 0440 07/25/19 1014 07/26/19 0831 07/28/19 0604  NA 135 136 135 132* 135 136  K 4.4 3.5 4.3 4.7 4.1 4.4  CL 103 107 102 103 103 106  CO2 24 24 26  21* 26 27  GLUCOSE 85 108* 84 388* 103* 99  BUN 52* 24* 24* 26* 20 11  CREATININE 1.51* 1.14 1.25* 1.56* 1.20 1.04  CALCIUM 8.4* 7.4* 7.9* 7.7* 8.1*  7.9*  MG 2.2  --   --   --   --   --   PHOS 4.1  --   --   --   --   --    GFR: Estimated Creatinine Clearance: 78.4 mL/min (by C-G formula based on SCr of 1.04 mg/dL). Liver Function Tests: Recent Labs  Lab 07/22/19 0613  ALBUMIN 3.1*   No results for input(s): LIPASE, AMYLASE in the last 168 hours. No results for input(s): AMMONIA in the last 168 hours. Coagulation Profile: Recent Labs  Lab 07/22/19 1224  INR 1.2   Cardiac Enzymes: No results for input(s): CKTOTAL, CKMB, CKMBINDEX, TROPONINI in the last 168 hours. BNP (last 3 results) Recent Labs    11/26/18 1025  PROBNP 56.0   HbA1C: Recent Labs    07/25/19 2022  HGBA1C 5.2   CBG: Recent Labs  Lab 07/27/19 2018 07/28/19 0025 07/28/19 0436 07/28/19 0803 07/28/19 1210  GLUCAP 100* 105* 85 84 99   Lipid Profile: No results for input(s): CHOL, HDL, LDLCALC, TRIG, CHOLHDL, LDLDIRECT in the last 72 hours. Thyroid Function Tests: No results for input(s): TSH, T4TOTAL, FREET4, T3FREE, THYROIDAB in the last 72 hours. Anemia Panel: No results for input(s): VITAMINB12, FOLATE, FERRITIN, TIBC, IRON, RETICCTPCT in the last 72 hours. Sepsis Labs: No results for input(s): PROCALCITON, LATICACIDVEN in the last 168 hours.  Recent Results (from the past 240 hour(s))  SARS Coronavirus 2 (CEPHEID - Performed in [place] hospital lab), Hosp Order     Status: None   Collection Time: 07/20/19  1:58 PM   Specimen: Nasopharyngeal Swab  Result Value Ref Range Status    SARS Coronavirus 2 NEGATIVE NEGATIVE Final    Comment: (NOTE) If result is NEGATIVE SARS-CoV-2 target nucleic acids are NOT DETECTED. The SARS-CoV-2 RNA is generally detectable in upper and lower  respiratory specimens during the acute phase of infection. The lowest  concentration of SARS-CoV-2 viral copies this assay can detect is 250  copies / mL. A negative result does not preclude SARS-CoV-2 infection  and should not be used as the sole basis for treatment or other  patient management decisions.  A negative result may occur with  improper specimen collection / handling, submission of specimen other  than nasopharyngeal swab, presence of viral mutation(s) within the  areas targeted by this assay, and inadequate number of viral copies  (<250 copies / mL). A negative result must be combined with clinical  observations, patient history, and epidemiological information. If result is POSITIVE SARS-CoV-2 target nucleic acids are DETECTED. The SARS-CoV-2 RNA is generally detectable in upper and lower  respiratory specimens dur ing the acute phase of infection.  Positive  results are indicative of active infection with SARS-CoV-2.  Clinical  correlation with patient history and other diagnostic information is  necessary to determine patient infection status.  Positive results do  not rule out bacterial infection or co-infection with other viruses. If result is PRESUMPTIVE POSTIVE SARS-CoV-2 nucleic acids MAY BE PRESENT.   A presumptive positive result was obtained on the submitted specimen  and confirmed on repeat testing.  While 2019 novel coronavirus  (SARS-CoV-2) nucleic acids may be present in the submitted sample  additional confirmatory testing may be necessary for epidemiological  and / or clinical management purposes  to differentiate between  SARS-CoV-2 and other Sarbecovirus currently known to infect humans.  If clinically indicated additional testing with an alternate test   methodology [phone]) is advised. The SARS-CoV-2 RNA is generally  detectable in upper and lower respiratory sp ecimens during the acute  phase of infection. The expected result is Negative. Fact Sheet for Patients:  StrictlyIdeas.no Fact Sheet for Healthcare Providers: BankingDealers.co.za This test is not yet approved or cleared by the [place] FDA and has been authorized for detection and/or diagnosis of SARS-CoV-2 by FDA under an Emergency Use Authorization (EUA).  This EUA will remain in effect (meaning this test can be used) for the duration of the COVID-19 declaration under Section 564(b)(1) of the Act, 21 U.S.C. section 360bbb-3(b)(1), unless the authorization is terminated or revoked sooner. Performed at [facility], [place] [address], [place], [place] [address]      Radiology Studies: No results found.  Scheduled Meds: . amiodarone  200 mg Oral Daily  . divalproex  500 mg Oral Daily  . feeding supplement (ENSURE ENLIVE)  237 mL Oral TID BM  . insulin aspart  0-9 Units Subcutaneous Q4H  . levETIRAcetam  1,000 mg Oral BID  . phenytoin (DILANTIN) IV  65 mg Intravenous Q8H  . tamsulosin  0.4 mg Oral Daily   Continuous Infusions: . dextrose 5 % and 0.9% NaCl 50 mL/hr at 07/26/19 0817     LOS: 7 days   Marylu Lund, MD [facility] Pager On Amion  If 7PM-7AM, please contact night-coverage 07/28/2019, 5:13 PM

## 2019-07-29 DIAGNOSIS — D689 Coagulation defect, unspecified: Secondary | ICD-10-CM

## 2019-07-29 DIAGNOSIS — N289 Disorder of kidney and ureter, unspecified: Secondary | ICD-10-CM

## 2019-07-29 DIAGNOSIS — R569 Unspecified convulsions: Secondary | ICD-10-CM

## 2019-07-29 DIAGNOSIS — I10 Essential (primary) hypertension: Secondary | ICD-10-CM

## 2019-07-29 DIAGNOSIS — K1379 Other lesions of oral mucosa: Secondary | ICD-10-CM

## 2019-07-29 DIAGNOSIS — D62 Acute posthemorrhagic anemia: Secondary | ICD-10-CM

## 2019-07-29 DIAGNOSIS — D682 Hereditary deficiency of other clotting factors: Secondary | ICD-10-CM

## 2019-07-29 LAB — GLUCOSE, CAPILLARY
Glucose-Capillary: 114 mg/dL — ABNORMAL HIGH (ref 70–99)
Glucose-Capillary: 68 mg/dL — ABNORMAL LOW (ref 70–99)
Glucose-Capillary: 73 mg/dL (ref 70–99)
Glucose-Capillary: 75 mg/dL (ref 70–99)
Glucose-Capillary: 78 mg/dL (ref 70–99)
Glucose-Capillary: 81 mg/dL (ref 70–99)
Glucose-Capillary: 84 mg/dL (ref 70–99)

## 2019-07-29 LAB — BASIC METABOLIC PANEL
Anion gap: 4 — ABNORMAL LOW (ref 5–15)
BUN: 11 mg/dL (ref 8–23)
CO2: 26 mmol/L (ref 22–32)
Calcium: 7.7 mg/dL — ABNORMAL LOW (ref 8.9–10.3)
Chloride: 108 mmol/L (ref 98–111)
Creatinine, Ser: 0.99 mg/dL (ref 0.61–1.24)
GFR calc Af Amer: >60 mL/min (ref >60)
GFR calc non Af Amer: >60 mL/min (ref >60)
Glucose, Bld: 95 mg/dL (ref 70–99)
Potassium: 4.5 mmol/L (ref 3.5–5.1)
Sodium: 138 mmol/L (ref 135–145)

## 2019-07-29 LAB — FERRITIN: Ferritin: 28 ng/mL (ref 24–336)

## 2019-07-29 LAB — CBC
HCT: 22.8 % — ABNORMAL LOW (ref 39.0–52.0)
Hemoglobin: 6.8 g/dL — CL (ref 13.0–17.0)
MCH: 29.1 pg (ref 26.0–34.0)
MCHC: 29.8 g/dL — ABNORMAL LOW (ref 30.0–36.0)
MCV: 97.4 fL (ref 80.0–100.0)
Platelets: 245 10*3/uL (ref 150–400)
RBC: 2.34 MIL/uL — ABNORMAL LOW (ref 4.22–5.81)
RDW: 15.4 % (ref 11.5–15.5)
WBC: 6.6 10*3/uL (ref 4.0–10.5)
nRBC: 0.3 % — ABNORMAL HIGH (ref 0.0–0.2)

## 2019-07-29 LAB — PROTIME-INR
INR: 1.3 — ABNORMAL HIGH (ref 0.8–1.2)
Prothrombin Time: 15.5 seconds — ABNORMAL HIGH (ref 11.4–15.2)

## 2019-07-29 LAB — PREPARE RBC (CROSSMATCH)

## 2019-07-29 LAB — FIBRINOGEN: Fibrinogen: 357 mg/dL (ref 210–475)

## 2019-07-29 LAB — APTT: aPTT: 48 seconds — ABNORMAL HIGH (ref 24–36)

## 2019-07-29 MED ORDER — INSULIN ASPART 100 UNIT/ML ~~LOC~~ SOLN: 0.0000 [IU] | Freq: Three times a day (TID) | SUBCUTANEOUS | Status: DC

## 2019-07-29 MED ORDER — PHENYTOIN SODIUM EXTENDED 100 MG PO CAPS
100.0000 mg | ORAL_CAPSULE | Freq: Two times a day (BID) | ORAL | Status: DC
  Administered 2019-07-29 – 2019-08-21 (×44): 100 mg via ORAL
  Filled 2019-07-29 (×47): qty 1

## 2019-07-29 MED ORDER — SODIUM CHLORIDE 0.9% IV SOLUTION
Freq: Once | INTRAVENOUS | Status: AC
  Administered 2019-08-02: 18:00:00 250 mL via INTRAVENOUS

## 2019-07-29 NOTE — Progress Notes (Signed)
Hematology note   Chart reviewed. I have spoken with my partner Dr. Irene Limbo, he will take over my role to f/u pt when he is still in the hospital and future clinical f/u.  Louis Ford  07/29/2019

## 2019-07-29 NOTE — Progress Notes (Signed)
PROGRESS NOTE    [facility].  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 Patient is poor historian.  According to the patient, he has not been on blood thinners since he had that retroperitoneal bleed about 2 months ago. He had been otherwise doing well. He walks with a walker. Also has a history of seizure and is on multiple medications. He went to dentist and had almost all of his teeth removed on 07/08/2019, following that he had continued bleeding into his mouth and they had talked to their dentist who recommended to control with a gauze piece. His wife has been trying to feed him with Gatorade and chicken soup but otherwise he has not been able to eat much. Patient states that 2 days ago he fell and hit his head had some pain but improved now.The reason he came to the hospital today is feeling dizzy and blood pressure dropping on ambulation.   Assessment & Plan:   Principal Problem:   Anemia due to blood loss, acute Active Problems:   HTN (hypertension)   Coronary artery disease involving native heart   Automatic implantable cardioverter-defibrillator in situ   Seizure (HCC)   Renal insufficiency   Factor VII deficiency (HCC)   Chronic systolic CHF (congestive heart failure), NYHA class 2 (HCC)   Chronic kidney disease (CKD), stage III (moderate) (HCC)  Anemia due to acute blood loss, symptomatic anemia:     - s/p 1 unit pRBCs d/t symptomatic anemia; appropriate response but  hemoglobin of 6.8 today. oral bleeding from recent dental procedure. Had been given FFP this admit with multiple doses of oral amicar. Dr. [name] on board. Underwent suture of oral incisions 8/2.continue to monitor hemoglobin.  Transfuse if hgb <7.  Patient does have a history of factor VIII deficiency.  On full liquids at this time.  Will discontinue IV fluid at this time due to peripheral edema.  Dizziness/lightheadedness    Likely secondary to blood loss.  Will closely monitor.  Denies such complaints at this time.  Chronic congestive heart failure status post AICD:      -Blood pressure was borderline low so Aldactone, Coreg, nitrates/hydralazine have been on hold.  Continue amiodarone for rate control.   Chronic atrial fibrillation     - Had been rate controlled on amiodarone. Hold rest of the medications due to low blood pressure.  History of seizure disorder:          - Valproic acid level initially noted to be low, cont current regimen as tolerated.  On oral Depakote, Keppra and IV Phenergan.  Will change phenytoin to p.o. at this time.  Chronic kidney disease stage III due to hypertension:   At baseline.  Creatinine level of 0.9  Dementia Stable at this time.  Patient's wife makes decisions for him.    Debility, deconditioning.  Patient has been seen by physical therapy Occupational Therapy who recommended skilled nursing facility for rehab.  DVT prophylaxis: SCD's  Code Status: Full  Family Communication: None  Disposition Plan: Possible DC to skilled nursing facility when no longer bleeding and stable.  Transfuse PRBC today.  Will order for COVID-19 test again for skilled nursing facility..  Consultants:   Dental service Dr. [name]  Hematology  Oral surgeon  Procedures:  suture of oral incisions 8/2 by Dr. [name]  Antimicrobials: Anti-infectives (From admission, onward)   Start     Dose/Rate Route Frequency Ordered Stop   07/26/19 1602  ceFAZolin (ANCEF) 2-4  GM/100ML-% IVPB    Note to Pharmacy: Darlys Gales   : cabinet override      07/26/19 1602 07/26/19 1626   07/26/19 0845  ceFAZolin (ANCEF) IVPB 2g/100 mL premix     2 g 200 mL/hr over 30 Minutes Intravenous On call to O.R. 07/26/19 6063 07/26/19 1631   07/21/19 0845  amoxicillin (AMOXIL) capsule 500 mg     500 mg Oral Every 8 hours 07/21/19 0837 07/24/19 2203   07/20/19 2200  amoxicillin (AMOXIL) capsule 500 mg  Status:  Discontinued     500 mg Oral 2 times daily 07/20/19 1801 07/21/19 0837      Subjective: Today, denies further bleeding from his gums.  No nausea vomiting or shortness of breath.  Denies any chest pain.  Complains of generalized weakness and extremity swelling.  Objective: Vitals:   07/28/19 1724 07/28/19 1725 07/28/19 2003 07/29/19 0405  BP: (!) 79/65 (!) 90/56 97/65 109/75  Pulse: 99 (!) 108 (!) 103 92  Resp:   18 18  Temp: 98 F (36.7 C)  98.3 F (36.8 C) 98.1 F (36.7 C)  TempSrc: Oral  Oral Oral  SpO2: 100% 100% 100% 98%  Weight:    98.3 kg  Height:        Intake/Output Summary (Last 24 hours) at 07/29/2019 0808 Last data filed at 07/29/2019 0446 Gross per 24 hour  Intake --  Output 850 ml  Net -850 ml   Filed Weights   07/27/19 0426 07/28/19 0500 07/29/19 0405  Weight: 95.5 kg 96.7 kg 98.3 kg    Physical examination:  General: Obese, not in obvious distress, awake communicative HENT: Edentulous.  Normocephalic, pupils equally reacting to light and accommodation.  No scleral pallor or icterus noted. Oral mucosa is moist.  Bleeding from the gingival area Chest:  Clear breath sounds.  Diminished breath sounds bilaterally. No crackles or wheezes.  CVS: S1 &S2 heard. No murmur.  Irregular rhythm.  AICD noted in place. Abdomen: Soft, nontender, nondistended.  Bowel sounds are heard.  Liver is not palpable, no abdominal mass palpated Extremities: No cyanosis, clubbing but edema noted of the peripheral extremities and upper extremity.  Peripheral pulses  are palpable. Psych: Alert, awake and communicative, normal mood CNS:  No cranial nerve deficits.  Power equal in all extremities.  No sensory deficits noted.  No cerebellar signs.   Skin: Warm and dry.  No rashes noted.   Data Reviewed: I have personally reviewed following labs and imaging studies  CBC: Recent Labs  Lab 07/25/19 1014 07/25/19 2022 07/26/19 0646 07/27/19 0317 07/28/19 0604 07/29/19 0335  WBC 4.7  --  7.0 11.3* 7.4 6.6  HGB 6.5* 7.9* 8.0* 8.2* 7.6* 6.8*  HCT 20.7* 24.5* 25.1* 26.9* 24.2* 22.8*  MCV 98.1  --  93.7 94.7 96.4 97.4  PLT 194  --  206 263 236 016   Basic Metabolic Panel: Recent Labs  Lab 07/25/19 0440 07/25/19 1014 07/26/19 0831 07/28/19 0604 07/29/19 0335  NA 135 132* 135 136 138  K 4.3 4.7 4.1 4.4 4.5  CL 102 103 103 106 108  CO2 26  21* 26 27 26   GLUCOSE 84 388* 103* 99 95  BUN 24* 26* 20 11 11   CREATININE 1.25* 1.56* 1.20 1.04 0.99  CALCIUM 7.9* 7.7* 8.1* 7.9* 7.7*   GFR: Estimated Creatinine Clearance: 83 mL/min (by C-G formula based on SCr of 0.99 mg/dL). Liver Function Tests: No results for input(s): AST, ALT, ALKPHOS, BILITOT, PROT, ALBUMIN in the last 168 hours. No results for input(s): LIPASE, AMYLASE in the last 168 hours. No results for input(s): AMMONIA in the last 168 hours. Coagulation Profile: Recent Labs  Lab 07/22/19 1224  INR 1.2   Cardiac Enzymes: No results for input(s): CKTOTAL, CKMB, CKMBINDEX, TROPONINI in the last 168 hours. BNP (last 3 results) Recent Labs    11/26/18 1025  PROBNP 56.0   HbA1C: No results for input(s): HGBA1C in the last 72 hours. CBG: Recent Labs  Lab 07/28/19 1959 07/29/19 0027 07/29/19 0401 07/29/19 0520 07/29/19 0748  GLUCAP 107* 114* 84 81 75   Lipid Profile: No results for input(s): CHOL, HDL, LDLCALC, TRIG, CHOLHDL, LDLDIRECT in the last 72 hours. Thyroid Function Tests: No results for input(s): TSH, T4TOTAL, FREET4, T3FREE, THYROIDAB in the last 72 hours. Anemia  Panel: No results for input(s): VITAMINB12, FOLATE, FERRITIN, TIBC, IRON, RETICCTPCT in the last 72 hours. Sepsis Labs: No results for input(s): PROCALCITON, LATICACIDVEN in the last 168 hours.  Recent Results (from the past 240 hour(s))  SARS Coronavirus 2 (CEPHEID - Performed in [place] hospital lab), Hosp Order     Status: None   Collection Time: 07/20/19  1:58 PM   Specimen: Nasopharyngeal Swab  Result Value Ref Range Status   SARS Coronavirus 2 NEGATIVE NEGATIVE Final    Comment: (NOTE) If result is NEGATIVE SARS-CoV-2 target nucleic acids are NOT DETECTED. The SARS-CoV-2 RNA is generally detectable in upper and lower  respiratory specimens during the acute phase of infection. The lowest  concentration of SARS-CoV-2 viral copies this assay can detect is 250  copies / mL. A negative result does not preclude SARS-CoV-2 infection  and should not be used as the sole basis for treatment or other  patient management decisions.  A negative result may occur with  improper specimen collection / handling, submission of specimen other  than nasopharyngeal swab, presence of viral mutation(s) within the  areas targeted by this assay, and inadequate number of viral copies  (<250 copies / mL). A negative result must be combined with clinical  observations, patient history, and epidemiological information. If result is POSITIVE SARS-CoV-2 target nucleic acids are DETECTED. The SARS-CoV-2 RNA is generally detectable in upper and lower  respiratory specimens dur ing the acute phase of infection.  Positive  results are indicative of active infection with SARS-CoV-2.  Clinical  correlation with patient history and other diagnostic information is  necessary to determine patient infection status.  Positive results do  not rule out bacterial infection or co-infection with other viruses. If result is PRESUMPTIVE POSTIVE SARS-CoV-2 nucleic acids MAY BE PRESENT.   A presumptive positive result was  obtained on the submitted specimen  and confirmed on repeat testing.  While 2019 novel coronavirus  (SARS-CoV-2) nucleic acids may be present in the submitted sample  additional confirmatory testing may be necessary for epidemiological  and / or clinical management purposes  to differentiate between  SARS-CoV-2 and other Sarbecovirus currently known to infect humans.  If clinically indicated additional testing with an alternate test  methodology [phone]) is advised. The SARS-CoV-2 RNA is generally  detectable in upper  and lower respiratory sp ecimens during the acute  phase of infection. The expected result is Negative. Fact Sheet for Patients:  StrictlyIdeas.no Fact Sheet for Healthcare Providers: BankingDealers.co.za This test is not yet approved or cleared by the [place] FDA and has been authorized for detection and/or diagnosis of SARS-CoV-2 by FDA under an Emergency Use Authorization (EUA).  This EUA will remain in effect (meaning this test can be used) for the duration of the COVID-19 declaration under Section 564(b)(1) of the Act, 21 U.S.C. section 360bbb-3(b)(1), unless the authorization is terminated or revoked sooner. Performed at [facility], [place] [address], [place],  [address]      Radiology Studies: No results found.  Scheduled Meds:  sodium chloride   Intravenous Once   amiodarone  200 mg Oral Daily   divalproex  500 mg Oral Daily   feeding supplement (ENSURE ENLIVE)  237 mL Oral TID BM   insulin aspart  0-9 Units Subcutaneous Q4H   levETIRAcetam  1,000 mg Oral BID   phenytoin (DILANTIN) IV  65 mg Intravenous Q8H   Continuous Infusions:  dextrose 5 % and 0.9% NaCl 50 mL/hr at 07/28/19 2301     LOS: 8 days   Flora Lipps, MD [facility] Pager On Amion  If 7PM-7AM, please contact night-coverage 07/29/2019, 8:08 AM

## 2019-07-29 NOTE — Progress Notes (Signed)
This shift bladder scan read 40 ml with output at 350 ml. Will continue to monitor for retention

## 2019-07-29 NOTE — Progress Notes (Signed)
[facility]   HEMATOLOGY/ONCOLOGY INPATIENT PROGRESS NOTE  Date of Service: 07/29/2019  Inpatient Attending: .Pokhrel, Corrie Mckusick, MD   SUBJECTIVE  Patient was seen in hematology followup today. Case was discussed with Dr Burr Medico. He notes that he hasnt been having much gum/dental bleeding today and that this is much improved after his recent alveoloplasty on 8/2 with use of thrombin soaked gelfoam. He has been off anticoag for his afib since 03/2019 when he had a significant retroperitoneal bleed. Currently does not report any non surgical site bleeding. Does have poor gum health.   OBJECTIVE:  NAD  PHYSICAL EXAMINATION: . Vitals:   07/29/19 0405 07/29/19 1412 07/29/19 1537 07/29/19 1607  BP: 109/75 94/65 105/72 100/73  Pulse: 92 81 72 78  Resp: 18 12 15 17   Temp: 98.1 F (36.7 C) 98.2 F (36.8 C) 98.1 F (36.7 C) 98.4 F (36.9 C)  TempSrc: Oral Oral Axillary Axillary  SpO2: 98% (!) 81% 100% 100%  Weight: 216 lb 11.4 oz (98.3 kg)     Height:       Filed Weights   07/27/19 0426 07/28/19 0500 07/29/19 0405  Weight: 210 lb 8.6 oz (95.5 kg) 213 lb 1.6 oz (96.7 kg) 216 lb 11.4 oz (98.3 kg)   .Body mass index is 30.24 kg/m.  GENERAL:alert, in no acute distress and comfortable SKIN: no acute rashes OROPHARYNX:no exudate, no erythema and lips, buccal mucosa, and tongue normal  NECK: supple, mild JVD LYMPH:  no palpable lymphadenopathy in the cervical, axillary or inguinal LUNGS: clear to auscultation with normal respiratory effort HEART: irregular rate & rhythm. ABDOMEN: abdomen soft, non-tender, normoactive bowel sounds  PSYCH: alert & oriented x 3 with fluent speech NEURO: no focal motor/sensory deficits  MEDICAL HISTORY:  Past Medical History:  Diagnosis Date  . AICD (automatic cardioverter/defibrillator) present   . Atrial fibrillation   09/22/2012  . Blood loss anemia 04/18/2017   After GI bleed from colonoscopy and polypectomy  . CAD (coronary artery disease)   . Chronic  systolic heart failure ([place])   . 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 GIB (gastrointestinal bleeding) 02/20/2017  . History of MRSA infection 05/2011  . HTN (hypertension)   . Hx of adenomatous colonic polyps 02/20/2017   01/2017 - 3 cm sigmoid TV adenoma and other smaller polyps - had post-polypectomy bleed Tx w/ clips Consider repeat colonoscopy 3 yrs [name], MD, [name]   . Hyperlipidemia   . implantable cardiac defibrillator-Biotronik    Device Implanted 2006; s/p gen change 03/2011 : bleeding persistent with pocket erosion and infection; explant and reimplant  06/2011  . Ischemic cardiomyopathy    EF 15 to 20% by TTE and TEE in 09/2012.  Severe LV dysfunction  . Obesity    BMI 31 in 09/2012  . Persistent atrial fibrillation with rapid ventricular response 04/21/2014  . Retroperitoneal bleed 04/21/2019  . Seizure disorder ([place]) latest 09/30/2012  . Seizures ([place])   . Stroke [facility])     SURGICAL HISTORY: Past Surgical History:  Procedure Laterality Date  . ALVEOLOPLASTY N/A 07/26/2019   Procedure: ALVEOLOPLASTY WITH RESUTURING OF ORAL WOUND;  Surgeon: [name], [name];  Location: [facility];  Service: Oral Surgery;  Laterality: N/A;  . CARDIAC CATHETERIZATION N/A 10/12/2015   Procedure: Right Heart Cath;  Surgeon: [name], MD;  Location: [facility] CV LAB;  Service: Cardiovascular;  Laterality: N/A;  . CARDIOVERSION  09/24/2012   Procedure: CARDIOVERSION;  Surgeon: Arnette Norris  Deboraha Sprang, MD;  Location: [place] ENDOSCOPY;  Service: Cardiovascular;  Laterality: N/A;  . CARDIOVERSION N/A 12/25/2017   Procedure: CARDIOVERSION;  Surgeon: [name], MD;  Location: [facility];  Service: Cardiovascular;  Laterality: N/A;  . COLONOSCOPY W/ POLYPECTOMY  02/13/2017  . CORONARY ARTERY BYPASS GRAFT  2011   in [place]  . FLEXIBLE SIGMOIDOSCOPY N/A 02/20/2017   Procedure: FLEXIBLE SIGMOIDOSCOPY;  Surgeon: [name], MD;  Location[facility] ENDOSCOPY;  Service:  Endoscopy;  Laterality: N/A;  . FLEXIBLE SIGMOIDOSCOPY N/A 02/21/2017   Procedure: FLEXIBLE SIGMOIDOSCOPY;  Surgeon: [name], MD;  Location: [facility] ENDOSCOPY;  Service: Endoscopy;  Laterality: N/A;  . ICD    . TEE WITHOUT CARDIOVERSION  09/24/2012   Procedure: TRANSESOPHAGEAL ECHOCARDIOGRAM (TEE);  Surgeon: [name], MD;  Location: [place];  Service: Cardiovascular;  Laterality: N/A;  dave/anesth, dl, cindy/echo     SOCIAL HISTORY: Social History   Socioeconomic History  . Marital status: Married    Spouse name: Not on file  . Number of children: Not on file  . Years of education: Not on file  . Highest education level: Not on file  Occupational History  . Not on file  Social Needs  . Financial resource strain: Not on file  . Food insecurity    Worry: Not on file    Inability: Not on file  . Transportation needs    Medical: Not on file    Non-medical: Not on file  Tobacco Use  . Smoking status: Former Smoker    Types: Pipe    Quit date: 09/21/2008    Years since quitting: 10.8  . Smokeless tobacco: Former Systems developer    Quit date: 09/21/2008  Substance and Sexual Activity  . Alcohol use: No  . Drug use: No  . Sexual activity: Not Currently    Birth control/protection: None  Lifestyle  . Physical activity    Days per week: Not on file    Minutes per session: Not on file  . Stress: Not on file  Relationships  . Social Herbalist on phone: Not on file    Gets together: Not on file    Attends religious service: Not on file    Active member of club or organization: Not on file    Attends meetings of clubs or organizations: Not on file    Relationship status: Not on file  . Intimate partner violence    Fear of current or ex partner: Not on file    Emotionally abused: Not on file    Physically abused: Not on file    Forced sexual activity: Not on file  Other Topics Concern  . Not on file  Social History Narrative   ** Merged History Encounter **         FAMILY HISTORY: Family History  Problem Relation Age of Onset  . Arthritis Mother   . Heart disease Mother   . Heart attack Mother   . Other Father        smoker  . Hypertension Neg Hx        unknown  . Stroke Neg Hx        unknown    ALLERGIES:  has No Known Allergies.  MEDICATIONS:  Scheduled Meds: . sodium chloride   Intravenous Once  . amiodarone  200 mg Oral Daily  . divalproex  500 mg Oral Daily  . feeding supplement (ENSURE ENLIVE)  237 mL Oral TID BM  .  insulin aspart  0-9 Units Subcutaneous TID AC & HS  . levETIRAcetam  1,000 mg Oral BID  . phenytoin  100 mg Oral BID   Continuous Infusions: PRN Meds:.acetaminophen, aminocaproic acid, heparin lock flush, heparin lock flush, labetalol, morphine injection, ondansetron **OR** ondansetron (ZOFRAN) IV, polyethylene glycol, sodium chloride flush, sodium chloride flush, sodium chloride flush  REVIEW OF SYSTEMS:    10 Point review of Systems was done is negative except as noted above.   LABORATORY DATA:  I have reviewed the data as listed  . CBC Latest Ref Rng & Units 07/30/2019 07/29/2019 07/28/2019  WBC 4.0 - 10.5 K/uL 7.9 6.6 7.4  Hemoglobin 13.0 - 17.0 g/dL 7.7(L) 6.8(LL) 7.6(L)  Hematocrit 39.0 - 52.0 % 25.5(L) 22.8(L) 24.2(L)  Platelets 150 - 400 K/uL 276 245 236   . CBC    Component Value Date/Time   WBC 6.6 07/29/2019 0335   RBC 2.34 (L) 07/29/2019 0335   HGB 6.8 (LL) 07/29/2019 0335   HCT 22.8 (L) 07/29/2019 0335   PLT 245 07/29/2019 0335   MCV 97.4 07/29/2019 0335   MCH 29.1 07/29/2019 0335   MCHC 29.8 (L) 07/29/2019 0335   RDW 15.4 07/29/2019 0335   LYMPHSABS 1.1 07/22/2019 0613   MONOABS 0.9 07/22/2019 0613   EOSABS 0.1 07/22/2019 0613   BASOSABS 0.0 07/22/2019 0613     . CMP Latest Ref Rng & Units 07/29/2019 07/28/2019 07/26/2019  Glucose 70 - 99 mg/dL 95 99 103(H)  BUN 8 - 23 mg/dL 11 11 20   Creatinine 0.61 - 1.24 mg/dL 0.99 1.04 1.20  Sodium 135 - 145 mmol/L 138 136 135  Potassium 3.5 -  5.1 mmol/L 4.5 4.4 4.1  Chloride 98 - 111 mmol/L 108 106 103  CO2 22 - 32 mmol/L 26 27 26   Calcium 8.9 - 10.3 mg/dL 7.7(L) 7.9(L) 8.1(L)  Total Protein 6.5 - 8.1 g/dL - - -  Total Bilirubin 0.3 - 1.2 mg/dL - - -  Alkaline Phos 38 - 126 U/L - - -  AST 15 - 41 U/L - - -  ALT 0 - 44 U/L - - -   Component     Latest Ref Rng & Units 10/02/2012 10/03/2012  Prothrombin Time     11.4 - 15.2 seconds    INR     0.8 - 1.2    APTT     24 - 36 seconds    Fibrinogen     210 - 475 mg/dL    D-Dimer, Quant     0.00 - 0.50 ug/mL-FEU    Platelets     150 - 400 K/uL    Smear Review         PTT     <30 seconds  30 (H)  1 HR INCUB PT 1:1NP     seconds  28  Patient post incubation     seconds  35  Patient-1/1, Incubated Mix-PTT     seconds  34  Post inc 1:1 mix, npp + pt     seconds  32  Interpretation-PTT Mixing Study       INHIBITOR NOT DETECTED  Protime     8.9 - 12.1 seconds  13.3 (H)  Patient-1/1, Immediate Mix-PT     seconds  11.5  Patient post incubation-ptmix     seconds  13.3  Patient-1/1, Incubated Mix-PT     seconds  12.1  Post inc 1:1 mix, npp + pt-ptmix     seconds  11.9  PT, Mixing  Interp       INHIBITOR NOT DETECTED  Coagulation Factor VIII     73 - 140 % 276 (H)   Ristocetin Co-factor, Plasma     42 - 200 % 166   Von Willebrand Antigen, Plasma     50 - 217 % 192   Factor VII Activity     51 - 186 % 32 (L)    Component     Latest Ref Rng & Units 10/09/2012 07/22/2019  Prothrombin Time     11.4 - 15.2 seconds  15.4 (H)  INR     0.8 - 1.2  1.2  APTT     24 - 36 seconds  37 (H)  Fibrinogen     210 - 475 mg/dL  299  D-Dimer, Quant     0.00 - 0.50 ug/mL-FEU  0.57 (H)  Platelets     150 - 400 K/uL  152  Smear Review       NO SCHISTOCYTES SEEN  PTT     <30 seconds    1 HR INCUB PT 1:1NP     seconds    Patient post incubation     seconds    Patient-1/1, Incubated Mix-PTT     seconds    Post inc 1:1 mix, npp + pt     seconds    Interpretation-PTT  Mixing Study         Protime     8.9 - 12.1 seconds    Patient-1/1, Immediate Mix-PT     seconds    Patient post incubation-ptmix     seconds    Patient-1/1, Incubated Mix-PT     seconds    Post inc 1:1 mix, npp + pt-ptmix     seconds    PT, Mixing Interp         Coagulation Factor VIII     73 - 140 %    Ristocetin Co-factor, Plasma     42 - 200 %    Von Willebrand Antigen, Plasma     50 - 217 %    Factor VII Activity     51 - 186 % 60 69    RADIOGRAPHIC STUDIES: I have personally reviewed the radiological images as listed and agreed with the findings in the report. Ct Head Wo Contrast  Result Date: 07/20/2019 CLINICAL DATA:  Pain following recent trauma EXAM: CT HEAD WITHOUT CONTRAST TECHNIQUE: Contiguous axial images were obtained from the base of the skull through the vertex without intravenous contrast. COMPARISON:  December 05, 2018 FINDINGS: Brain: There is stable age related volume loss. There is no intracranial mass, hemorrhage, extra-axial fluid collection, or midline shift. There is small vessel disease in the centra semiovale bilaterally which appear stable. No acute infarct evident. Vascular: No hyperdense vessel. There is calcification in each carotid siphon region as well as in each middle cerebral artery. Skull: Bony calvarium appears intact. Sinuses/Orbits: Visualized paranasal sinuses are clear. Orbits appear symmetric bilaterally. Other: Mastoid air cells are clear. 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 No acute infarct. No mass or hemorrhage. There are multiple foci of arterial vascular calcification. Electronically Signed   By: Lowella Grip III M.D.   On: 07/20/2019 14:12   Dg Chest Port 1 View  Result Date: 07/25/2019 CLINICAL DATA:  Patient became unresponsive with exertion. EXAM: PORTABLE CHEST 1 VIEW COMPARISON:  Chest CT, 04/21/2019.  Chest radiographs, 12/07/2019. FINDINGS: There stable changes from prior cardiac surgery.  Cardiac silhouette is mildly enlarged.  No mediastinal or hilar masses. Lungs are clear.  No pleural effusion or pneumothorax. Right anterior chest wall single lead pacemaker is stable. Skeletal structures are grossly intact. IMPRESSION: No acute cardiopulmonary disease. Electronically Signed   By: Lajean Manes M.D.   On: 07/25/2019 16:57   [place] Ekg Site Rite  Result Date: 07/25/2019 If Site Rite image not attached, placement could not be confirmed due to current cardiac rhythm.   ASSESSMENT & PLAN:   73 yo patient with multiple medical co-morbidities included afib, severe systolic CHF, CVA with ?? Mid factor VII deficiency with previous post-op bleeding issues with pacemaker placement, TEE. No issues with spontaneous bleeding at baseline.[facility]  1) Post operative dental/gum bleeding requiring transfusions and rpt Alveoloplasty and thrombin soaked gel foam application on .  Patient notes minimal bleeding at ths time. Drop in hgb to 6.8 today - likely reflecitng operative blood loss.  2) ? Factor VII deficiency - vs coumadin effect in past. FVII levels 69% which is WNL Platelet function assay - WNL PLAN -rpt PT, aPTT, thrombin time, VWD panel, Factor XIII -some concern for possible CPVC of liver and possible related coaguloapthy causing mild elevation of PT and aPTT--- would support vit K dep clotting factors with vit K 5mg  po daily for 10 days -not on anticoagulation for afib -no indication for rFVIIa or Lifescape replacement at this time which is his case is not clearly indicated and would be high risk for thrombosis. -significant gum disease serving as a local risk factor for bleeding issues -would continue Amicar soaks Hold 4 g/10 mL in mouth for 2-3 minutes then spit out. Repeat every 4-6 hours. Not to swish vigorously to  Avoid dislodging clots. Would avoid systemic use of antifibrinolytics given CF and afib and increased risk of CVA and thrombosis. -primary focus should be on optimizing local  interventions by dental and addressing gum disease.  3) Iron def -would given IV feraheme 510mg  with a goal to keep ferritin >100 esp in setting of systolic CHF  Hematology will continue to follow.    I spent 30 minutes counseling the patient face to face. The total time spent in the appointment was 45 minutes and more than 50% was on counseling and direct patient cares.    Sullivan Lone MD [place] AAHIVMS [facility] Hematology/Oncology Physician [facility]  (Office):       [phone] (Work cell):  [phone] (Fax):           [phone]  07/29/2019 4:59 PM

## 2019-07-29 NOTE — Progress Notes (Signed)
CRITICAL VALUE ALERT  Critical Value:  hgb 6.8  Date & Time Notied:  [id number]  Provider Notified: yes  Orders Received/Actions taken: yes

## 2019-07-29 NOTE — Progress Notes (Signed)
PT Cancellation Note  Patient Details Name: [facility]. MRN: [id number] DOB: 05-Jul-1946   Cancelled Treatment:     HgB 6.8 and hypotensive for activity.  Pt has been evaluated with rec for SNF.  Will request PT to see tomorrow for Insurance for updated PT PN   Rica Koyanagi  PTA Acute  Rehabilitation Services Pager      [phone] Office      [phone]

## 2019-07-30 DIAGNOSIS — D689 Coagulation defect, unspecified: Secondary | ICD-10-CM

## 2019-07-30 DIAGNOSIS — K1379 Other lesions of oral mucosa: Secondary | ICD-10-CM

## 2019-07-30 LAB — GLUCOSE, CAPILLARY
Glucose-Capillary: 66 mg/dL — ABNORMAL LOW (ref 70–99)
Glucose-Capillary: 60 mg/dL — ABNORMAL LOW (ref 70–99)
Glucose-Capillary: 93 mg/dL (ref 70–99)
Glucose-Capillary: 117 mg/dL — ABNORMAL HIGH (ref 70–99)
Glucose-Capillary: 71 mg/dL (ref 70–99)
Glucose-Capillary: 95 mg/dL (ref 70–99)

## 2019-07-30 LAB — BPAM RBC
Blood Product Expiration Date: 202008302359
ISSUE DATE / TIME: 202008051545
Unit Type and Rh: 5100

## 2019-07-30 LAB — TYPE AND SCREEN
ABO/RH(D): O POS
Antibody Screen: NEGATIVE
Unit division: 0

## 2019-07-30 LAB — CBC
HCT: 25.5 % — ABNORMAL LOW (ref 39.0–52.0)
Hemoglobin: 7.7 g/dL — ABNORMAL LOW (ref 13.0–17.0)
MCH: 28.4 pg (ref 26.0–34.0)
MCHC: 30.2 g/dL (ref 30.0–36.0)
MCV: 94.1 fL (ref 80.0–100.0)
Platelets: 276 10*3/uL (ref 150–400)
RBC: 2.71 MIL/uL — ABNORMAL LOW (ref 4.22–5.81)
RDW: 15.6 % — ABNORMAL HIGH (ref 11.5–15.5)
WBC: 7.9 10*3/uL (ref 4.0–10.5)
nRBC: 0.3 % — ABNORMAL HIGH (ref 0.0–0.2)

## 2019-07-30 LAB — BASIC METABOLIC PANEL
Anion gap: 2 — ABNORMAL LOW (ref 5–15)
BUN: 9 mg/dL (ref 8–23)
CO2: 26 mmol/L (ref 22–32)
Calcium: 7.7 mg/dL — ABNORMAL LOW (ref 8.9–10.3)
Chloride: 107 mmol/L (ref 98–111)
Creatinine, Ser: 0.91 mg/dL (ref 0.61–1.24)
GFR calc Af Amer: >60 mL/min (ref >60)
GFR calc non Af Amer: >60 mL/min (ref >60)
Glucose, Bld: 91 mg/dL (ref 70–99)
Potassium: 4.7 mmol/L (ref 3.5–5.1)
Sodium: 135 mmol/L (ref 135–145)

## 2019-07-30 LAB — PLATELET FUNCTION ASSAY: Collagen / Epinephrine: 132 seconds (ref 0–193)

## 2019-07-30 LAB — MAGNESIUM: Magnesium: 1.8 mg/dL (ref 1.7–2.4)

## 2019-07-30 MED ORDER — SODIUM CHLORIDE 0.9% IV SOLUTION: Freq: Once | INTRAVENOUS | Status: DC

## 2019-07-30 MED ORDER — SPIRONOLACTONE 25 MG PO TABS
25.0000 mg | ORAL_TABLET | Freq: Every day | ORAL | Status: DC
  Administered 2019-07-30 – 2019-08-02 (×4): 25 mg via ORAL
  Filled 2019-07-30 (×4): qty 1

## 2019-07-30 MED ORDER — DEXTROSE 50 % IV SOLN
INTRAVENOUS | Status: AC
  Administered 2019-07-30: 25 mL
  Filled 2019-07-30: qty 50

## 2019-07-30 MED ORDER — OXYCODONE-ACETAMINOPHEN 5-325 MG PO TABS
1.0000 | ORAL_TABLET | ORAL | Status: DC | PRN
  Administered 2019-08-12: 1 via ORAL
  Filled 2019-07-30 (×3): qty 1

## 2019-07-30 MED ORDER — MORPHINE SULFATE (PF) 2 MG/ML IV SOLN
2.0000 mg | Freq: Once | INTRAVENOUS | Status: DC
  Filled 2019-07-30 (×2): qty 1

## 2019-07-30 MED ORDER — MORPHINE SULFATE (PF) 2 MG/ML IV SOLN
2.0000 mg | INTRAVENOUS | Status: DC | PRN
  Administered 2019-08-02 – 2019-08-08 (×2): 2 mg via INTRAVENOUS
  Filled 2019-07-30 (×3): qty 1

## 2019-07-30 NOTE — Progress Notes (Signed)
Occupational Therapy Treatment Patient Details Name: [facility]. MRN: [id number] DOB: Apr 23, 1946 Today's Date: 07/30/2019    History of present illness [facility]. is a 73 y.o. 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   OT comments  Pt making good progress. His HR was increased.  Could not get good 02 reading   Follow Up Recommendations  SNF;Supervision/Assistance - 24 hour    Equipment Recommendations  None recommended by OT    Recommendations for Other Services      Precautions / Restrictions Precautions Precautions: Fall Precaution Comments: monitor orthostatics, watch HR, was difficult to get O2 sats Restrictions Weight Bearing Restrictions: No       Mobility Bed Mobility Overal bed mobility: Needs Assistance Bed Mobility: Supine to Sit     Supine to sit: Supervision;HOB elevated     General bed mobility comments: Pt utilized bed rails to assist with HOB elevated.  Min cues for technique.  Transfers Overall transfer level: Needs assistance Equipment used: Rolling walker (2 wheeled) Transfers: Sit to/from Stand Sit to Stand: Supervision         General transfer comment: Pt did very well pushing from bed to stand to RW.  Did elevated bed slightly as he reports bed at home is higher.    Balance                                           ADL either performed or assessed with clinical judgement   ADL                                         General ADL Comments: pt is able to reach to feet and supervision to  stand.  HR increases with activity:  encouraged rest breaks     Vision       Perception     Praxis      Cognition Arousal/Alertness: Awake/alert Behavior During Therapy: WFL for tasks assessed/performed Overall Cognitive Status: Within Functional Limits for tasks assessed                                 General Comments: Pt would repeat some questions already asked several minutes prior, but overall able to follow all commands.        Exercises     Shoulder Instructions       General Comments pt's HR 102-160. Could not get good reading on sats, despite several machines.  Initially 88%, no SOB.  Used 3 liters when walking    Pertinent Vitals/ Pain       Pain Assessment: 0-10 Pain Score: 4  Faces Pain Scale: Hurts little more Pain Location: R knee Pain Descriptors / Indicators: Aching Pain Intervention(s): Premedicated before session  Home Living  Prior Functioning/Environment              Frequency  Min 2X/week        Progress Toward Goals  OT Goals(current goals can now be found in the care plan section)  Progress towards OT goals: (goals updated)  ADL Goals Pt Will Perform Grooming: (d/c) Pt Will Perform Upper Body Bathing: (d/c) Pt Will Perform Lower Body Bathing: (d/c) Pt Will Perform Upper Body Dressing: (d/c) Pt Will Perform Lower Body Dressing: (d/c) Pt Will Transfer to Toilet: with modified independence;ambulating;bedside commode Pt Will Perform Toileting - Clothing Manipulation and hygiene: with modified independence Additional ADL Goal #1: pt will be mod I with adl including clothing retrieval Additional ADL Goal #2: pt will initiate a rest break without cues for energy conservation  Plan      Co-evaluation    PT/OT/SLP Co-Evaluation/Treatment: Yes Reason for Co-Treatment: Complexity of the patient's impairments (multi-system involvement);To address functional/ADL  transfers;For patient/therapist safety PT goals addressed during session: Mobility/safety with mobility;Balance;Proper use of DME;Strengthening/ROM OT goals addressed during session: ADL's and self-care      AM-PAC OT "6 Clicks" Daily Activity     Outcome Measure   Help from another person eating meals?: None Help from another person taking care of personal grooming?: A Little Help from another person toileting, which includes using toliet, bedpan, or urinal?: A Little Help from another person bathing (including washing, rinsing, drying)?: A Little Help from another person to put on and taking off regular upper body clothing?: A Little Help from another person to put on and taking off regular lower body clothing?: A Little 6 Click Score: 19    End of Session Equipment Utilized During Treatment: Gait belt;Rolling walker  OT Visit Diagnosis: Unsteadiness on feet (R26.81);Other abnormalities of gait and mobility (R26.89);Muscle weakness (generalized) (M62.81);History of falling (Z91.81)   Activity Tolerance Patient tolerated treatment well   Patient Left in chair;with call bell/phone within reach;with chair alarm set   Nurse Communication          Time: [phone] OT Time Calculation (min): 24 min  Charges: OT General Charges $OT Visit: 1 Visit OT Treatments $Therapeutic Activity: 8-22 mins  Lesle Chris, OTR/L Acute Rehabilitation Services [phone] [place] pager [phone] office 07/30/2019   [place] 07/30/2019, 10:19 AM

## 2019-07-30 NOTE — Progress Notes (Signed)
PROGRESS NOTE    [facility].  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 Patient is poor historian.  According to the patient, he has not been on blood thinners since he had that retroperitoneal bleed about 2 months ago. He had been otherwise doing well. He walks with a walker. Also has a history of seizure and is on multiple medications. He went to dentist and had almost all of his teeth removed on 07/08/2019, following that he had continued bleeding into his mouth and they had talked to their dentist who recommended to control with a gauze piece. His wife has been trying to feed him with Gatorade and chicken soup but otherwise he has not been able to eat much. Patient states that 2 days ago he fell and hit his head had some pain but improved now.The reason he came to the hospital today is feeling dizzy and blood pressure dropping on ambulation.   Assessment & Plan:   Principal Problem:   Anemia due to blood loss, acute Active Problems:   HTN (hypertension)   Coronary artery disease involving native heart   Automatic implantable cardioverter-defibrillator in situ   Seizure (HCC)   Renal insufficiency   Factor VII deficiency (HCC)   Chronic systolic CHF (congestive heart failure), NYHA class 2 (HCC)   Chronic kidney disease (CKD), stage III (moderate) (HCC)   Oral bleeding   Coagulopathy (HCC)  Anemia due to acute blood loss, symptomatic anemia:     - s/p 1 unit pRBCs d/t  symptomatic anemia; appropriate response but hemoglobin of 7.7 today. Oral bleeding from recent dental procedure. Had been given FFP this admit with multiple doses of oral amicar. Dr. [name] on board. Underwent suture of oral incisions 8/2. Continue to monitor hemoglobin.  Transfuse if hgb <7.  Patient does have a history of factor VIII deficiency.  On full liquids at this time.  Will advance diet as tolerated. Off Dextrose at this time.  Hypoglycemia. Poor oral intake. On hypoglycemia protocol. Encouraged oral intake. Advance diet as tolerated.   Dizziness/lightheadedness    Likely secondary to blood loss.  Will closely monitor.  No complaints at this time.  Chronic congestive heart failure status post AICD:      -Blood pressure still borderline low so Aldactone, Coreg, nitrates/hydralazine have been on hold.  Continue amiodarone for rate control.   Chronic atrial fibrillation     - Had been rate controlled on amiodarone. Hold rest of the medications due to low blood pressure.  History of seizure disorder:          - Valproic acid level initially noted to be low, continue current regimen as tolerated.  On oral Depakote, Keppra.  Will change phenytoin to p.o. at this time.  Chronic kidney disease stage III due to hypertension:   At baseline.  Creatinine level of 0.9  Dementia Stable at this time.  Patient's wife makes decisions for him.  She does have HPOA on him.   Debility, deconditioning.  Patient has been seen by physical therapy, occupational therapy  recommended skilled nursing facility for rehab.  DVT prophylaxis: SCD's  Code Status: Full  Family Communication: I spoke with the patient's wife on the phone and updated about the clinical condition of the patient. She indicated that she would like her husband to be discharged to skilled nursing facility.  Disposition Plan:  skilled nursing facility when bed is available. Ordered for COVID-19 test again for skilled nursing  facility- pending.  Consultants:   Dental service Dr. [name]  Hematology  Oral surgeon  Procedures:  suture of oral incisions 8/2 by Dr. [name]  Antimicrobials: Anti-infectives (From admission, onward)   Start     Dose/Rate Route Frequency Ordered Stop   07/26/19 1602  ceFAZolin (ANCEF) 2-4 GM/100ML-% IVPB    Note to Pharmacy: Darlys Gales   : cabinet override      07/26/19 1602 07/26/19 1626   07/26/19 0845  ceFAZolin (ANCEF) IVPB 2g/100 mL premix     2 g 200 mL/hr over 30 Minutes Intravenous On call to O.R. 07/26/19 0932 07/26/19 1631   07/21/19 0845  amoxicillin (AMOXIL) capsule 500 mg     500 mg Oral Every 8 hours 07/21/19 0837 07/24/19 2203   07/20/19 2200  amoxicillin (AMOXIL) capsule 500 mg  Status:  Discontinued     500 mg Oral 2 times daily 07/20/19 1801 07/21/19 0837      Subjective: Today, states that he wants to go home. He denies gum bleeding. Nursing today stated that due to poor oral intake his sugar was low.   Objective: Vitals:   07/29/19 1607 07/29/19 1817 07/29/19 2027 07/30/19 0437  BP: 100/73 113/84 94/67 104/71  Pulse: 78 75 79 92  Resp: 17 16 20 20   Temp: 98.4 F (36.9 C) 98.1 F (36.7 C) 98.8 F (37.1 C) 98.3 F (36.8 C)  TempSrc: Axillary  Oral   SpO2: 100% 95% 100% 100%  Weight:      Height:        Intake/Output Summary (Last 24 hours) at 07/30/2019 0930 Last data filed at 07/30/2019 0758 Gross per 24 hour  Intake 1025.33 ml  Output 1550 ml  Net -524.67 ml   Filed Weights   07/27/19 0426 07/28/19 0500 07/29/19 0405  Weight: 95.5 kg 96.7 kg 98.3 kg    Physical examination: General: Obese, not in obvious distress, awake communicative HENT: Edentulous.  Normocephalic, pupils equally reacting to light and accommodation.  No scleral pallor or icterus noted. Oral mucosa is moist.  No active bleeding from the gingival area Chest:  Clear breath sounds.  Diminished breath sounds bilaterally. No crackles or wheezes.  CVS: S1 &S2 heard.  No murmur.  Irregular rhythm.  AICD noted in place. Abdomen: Soft, nontender, nondistended.  Bowel sounds are heard.  Liver is not palpable, no abdominal mass palpated Extremities: No cyanosis, clubbing but edema noted of the peripheral extremities and upper extremity.  Peripheral pulses are palpable.  Psych: Alert, awake and communicative, normal mood CNS:  No cranial nerve deficits.  Power equal in all extremities.  No sensory deficits noted.  No cerebellar signs.   Skin: Warm and dry.  No rashes noted.   Data Reviewed: I have personally reviewed following labs and imaging studies  CBC: Recent Labs  Lab 07/26/19 0646 07/27/19 0317 07/28/19 0604 07/29/19 0335 07/30/19 0341  WBC 7.0 11.3* 7.4 6.6 7.9  HGB 8.0* 8.2* 7.6* 6.8* 7.7*  HCT 25.1* 26.9* 24.2* 22.8* 25.5*  MCV 93.7 94.7 96.4 97.4 94.1  PLT 206 263 236 245 671   Basic Metabolic Panel: Recent Labs  Lab 07/25/19 1014 07/26/19 0831 07/28/19 0604 07/29/19 0335 07/30/19 0341  NA 132* 135 136 138 135  K 4.7 4.1 4.4 4.5 4.7  CL 103 103 106 108 107  CO2 21* 26 27 26 26   GLUCOSE 388* 103* 99 95 91  BUN 26* 20 11 11 9   CREATININE 1.56* 1.20 1.04 0.99 0.91  CALCIUM 7.7* 8.1* 7.9* 7.7* 7.7*  MG  --   --   --   --  1.8   GFR: Estimated Creatinine Clearance: 90.3 mL/min (by C-G formula based on SCr of 0.91 mg/dL). Liver Function Tests: No results for input(s): AST, ALT, ALKPHOS, BILITOT, PROT, ALBUMIN in the last 168 hours. No results for input(s): LIPASE, AMYLASE in the last 168 hours. No results for input(s): AMMONIA in the last 168 hours. Coagulation Profile: Recent Labs  Lab 07/29/19 2113  INR 1.3*   Cardiac Enzymes: No results for input(s): CKTOTAL, CKMB, CKMBINDEX, TROPONINI in the last 168 hours. BNP (last 3 results) Recent Labs    11/26/18 1025  PROBNP 56.0   HbA1C: No results for input(s): HGBA1C in the last 72 hours. CBG: Recent Labs  Lab 07/29/19 1544 07/29/19 2028 07/30/19 0435 07/30/19 0748  07/30/19 0910  GLUCAP 73 68* 66* 60* 93   Lipid Profile: No results for input(s): CHOL, HDL, LDLCALC, TRIG, CHOLHDL, LDLDIRECT in the last 72 hours. Thyroid Function Tests: No results for input(s): TSH, T4TOTAL, FREET4, T3FREE, THYROIDAB in the last 72 hours. Anemia Panel: Recent Labs    07/29/19 2113  FERRITIN 28   Sepsis Labs: No results for input(s): PROCALCITON, LATICACIDVEN in the last 168 hours.  Recent Results (from the past 240 hour(s))  SARS Coronavirus 2 (CEPHEID - Performed in [place] hospital lab), Hosp Order     Status: None   Collection Time: 07/20/19  1:58 PM   Specimen: Nasopharyngeal Swab  Result Value Ref Range Status   SARS Coronavirus 2 NEGATIVE NEGATIVE Final    Comment: (NOTE) If result is NEGATIVE SARS-CoV-2 target nucleic acids are NOT DETECTED. The SARS-CoV-2 RNA is generally detectable in upper and lower  respiratory specimens during the acute phase of infection. The lowest  concentration of SARS-CoV-2 viral copies this assay can detect is 250  copies / mL. A negative result does not preclude SARS-CoV-2 infection  and should not be used as the sole basis for treatment or other  patient management decisions.  A negative result may occur with  improper specimen collection / handling, submission of specimen other  than nasopharyngeal swab, presence of viral mutation(s) within the  areas targeted by this assay, and inadequate number of viral copies  (<250 copies / mL). A negative result must be combined with clinical  observations, patient history, and epidemiological information. If result is POSITIVE SARS-CoV-2 target nucleic acids are DETECTED. The SARS-CoV-2 RNA is generally detectable in upper and lower  respiratory specimens dur ing the acute phase of infection.  Positive  results are indicative of active infection with SARS-CoV-2.  Clinical  correlation with patient history and other diagnostic information is  necessary to determine patient  infection status.  Positive results do  not rule out bacterial infection or co-infection with other viruses. If result is PRESUMPTIVE POSTIVE SARS-CoV-2 nucleic acids MAY BE PRESENT.   A presumptive positive result was obtained on the submitted specimen  and confirmed on repeat testing.  While 2019 novel coronavirus  (SARS-CoV-2) nucleic acids may be present in the submitted sample  additional confirmatory testing may be necessary  for epidemiological  and / or clinical management purposes  to differentiate between  SARS-CoV-2 and other Sarbecovirus currently known to infect humans.  If clinically indicated additional testing with an alternate test  methodology [phone]) is advised. The SARS-CoV-2 RNA is generally  detectable in upper and lower respiratory sp ecimens during the acute  phase of infection. The expected result is Negative. Fact Sheet for Patients:  StrictlyIdeas.no Fact Sheet for Healthcare Providers: BankingDealers.co.za This test is not yet approved or cleared by the [place] FDA and has been authorized for detection and/or diagnosis of SARS-CoV-2 by FDA under an Emergency Use Authorization (EUA).  This EUA will remain in effect (meaning this test can be used) for the duration of the COVID-19 declaration under Section 564(b)(1) of the Act, 21 U.S.C. section 360bbb-3(b)(1), unless the authorization is terminated or revoked sooner. Performed at [facility], [place] [address] [place], [place] [address]      Radiology Studies: No results found.  Scheduled Meds: . sodium chloride   Intravenous Once  . amiodarone  200 mg Oral Daily  . divalproex  500 mg Oral Daily  . feeding supplement (ENSURE ENLIVE)  237 mL Oral TID BM  . insulin aspart  0-9 Units Subcutaneous TID AC & HS  . levETIRAcetam  1,000 mg Oral BID  . phenytoin  100 mg Oral BID   Continuous Infusions:    LOS: 9 days   Flora Lipps, MD [facility] Pager On Amion  If 7PM-7AM, please contact night-coverage 07/30/2019, 9:30 AM

## 2019-07-30 NOTE — Progress Notes (Signed)
Physical Therapy Treatment Patient Details Name: [name] MRN: [id number] DOB: 09-07-1946 Today's Date: 07/30/2019    History of Present Illness Louis Mort. is a 74 y.o. 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    PT Comments    Pt made great progress today from a mobility standpoint.  He was able to perform bed mobility at S level and gait x 85' with RW at min A level.  Note that pts HR did increase to 160 during gait and PT/OT unable to get SaO2 reading.  Pt reports that if he has to have oxygen then he will go "somewhere else" but if he doesn't need oxygen then will go home.  Has wife and son that can help him.     Follow Up Recommendations  Home health PT;Supervision/Assistance - 24 hour     Equipment Recommendations  Rolling walker with 5" wheels(I believe he said he may have RW)    Recommendations for Other Services       Precautions / Restrictions Precautions Precautions: Fall Precaution Comments: monitor orthostatics, watch HR, was difficult to get O2 sats Restrictions Weight Bearing Restrictions: No    Mobility  Bed Mobility Overal bed mobility: Needs Assistance Bed Mobility: Supine to Sit     Supine to sit: Supervision;HOB elevated     General bed mobility comments: Pt utilized bed rails to assist with HOB elevated.  Min cues for technique.  Transfers Overall transfer level: Needs assistance Equipment used: Rolling walker (2 wheeled) Transfers: Sit to/from Stand Sit to Stand: Supervision         General transfer comment: Pt did very well  pushing from bed to stand to RW.  Did elevated bed slightly as he reports bed at home is higher.  Ambulation/Gait Ambulation/Gait assistance: Min assist;+2 safety/equipment(+2 assist for O2 management, chair) Gait Distance (Feet): 85 Feet Assistive device: Rolling walker (2 wheeled) Gait Pattern/deviations: Step-through pattern;Decreased stride length;Antalgic;Trunk flexed     General Gait Details: Pt requires min/guard steadying assist but also assist to manage RW as he tended to have it too close and also cues for posture throughout and proper hand placement on RW.  Note that HR did increase from 107 to 160 during gait per heart monitor.  PT/OT unable to get SaO2 during gait, however did get reading prior to gait and was 88% on 2L, therefore had pt ambulate on 3L O2.   Stairs             Wheelchair Mobility    Modified Rankin (Stroke Patients Only)       Balance Overall balance assessment: Needs assistance Sitting-balance support: Feet supported;Bilateral upper extremity supported Sitting balance-Leahy Scale: Good     Standing balance support: Bilateral upper extremity supported Standing balance-Leahy Scale: Poor Standing balance comment: required UE support                            Cognition Arousal/Alertness: Awake/alert Behavior During Therapy: WFL for tasks assessed/performed Overall Cognitive Status: Within Functional Limits for tasks assessed  General Comments: Pt would repeat some questions already asked several minutes prior, but overall able to follow all commands.      Exercises      General Comments General comments (skin integrity, edema, etc.): pt's HR 102-160. Could not get good reading on sats, despite several machines.  Initially 88%, no SOB.  Used 3 liters when walking      Pertinent Vitals/Pain Pain Assessment: 0-10 Pain Score: 4  Faces Pain Scale: Hurts little more Pain Location: R  knee Pain Descriptors / Indicators: Aching Pain Intervention(s): Premedicated before session    Home Living                      Prior Function            PT Goals (current goals can now be found in the care plan section) Acute Rehab PT Goals Patient Stated Goal: agreeable to get OOB PT Goal Formulation: With patient Time For Goal Achievement: 08/06/19 Potential to Achieve Goals: Fair Progress towards PT goals: Progressing toward goals    Frequency    Min 2X/week      PT Plan Current plan remains appropriate    Co-evaluation PT/OT/SLP Co-Evaluation/Treatment: Yes Reason for Co-Treatment: Complexity of the patient's impairments (multi-system involvement);To address functional/ADL transfers;For patient/therapist safety PT goals addressed during session: Mobility/safety with mobility;Balance;Proper use of DME;Strengthening/ROM OT goals addressed during session: ADL's and self-care      AM-PAC PT "6 Clicks" Mobility   Outcome Measure  Help needed turning from your back to your side while in a flat bed without using bedrails?: A Little Help needed moving from lying on your back to sitting on the side of a flat bed without using bedrails?: A Little Help needed moving to and from a bed to a chair (including a wheelchair)?: A Little Help needed standing up from a chair using your arms (e.g., wheelchair or bedside chair)?: None Help needed to walk in hospital room?: A Little Help needed climbing 3-5 steps with a railing? : A Lot 6 Click Score: 18    End of Session Equipment Utilized During Treatment: Gait belt;Oxygen Activity Tolerance: Patient tolerated treatment well;Treatment limited secondary to medical complications (Comment) Patient left: in chair;with call bell/phone within reach;with chair alarm set Nurse Communication: Mobility status;Other (comment)(need to replace condom catheter) PT Visit Diagnosis: Difficulty in walking, not elsewhere classified  (R26.2);Muscle weakness (generalized) (M62.81)     Time: 8270-7867 PT Time Calculation (min) (ACUTE ONLY): 24 min  Charges:  $Gait Training: 8-22 mins                     [name], PT, MPT  07/30/19, 10:25 AM    [name], [name] 07/30/2019, 10:24 AM

## 2019-07-30 NOTE — Progress Notes (Signed)
CRITICAL VALUE ALERT  Critical Value: CBG 60  Date & Time Notied:  07/30/2019 @0800   Provider Notified: Dr Louanne Belton   Orders Received/Actions taken: Initiate hypoglycemic protocol

## 2019-07-31 LAB — GLUCOSE, CAPILLARY
Glucose-Capillary: 57 mg/dL — ABNORMAL LOW (ref 70–99)
Glucose-Capillary: 70 mg/dL (ref 70–99)
Glucose-Capillary: 88 mg/dL (ref 70–99)
Glucose-Capillary: 69 mg/dL — ABNORMAL LOW (ref 70–99)
Glucose-Capillary: 90 mg/dL (ref 70–99)
Glucose-Capillary: 71 mg/dL (ref 70–99)

## 2019-07-31 LAB — CBC
HCT: 25.8 % — ABNORMAL LOW (ref 39.0–52.0)
Hemoglobin: 8 g/dL — ABNORMAL LOW (ref 13.0–17.0)
MCH: 29.2 pg (ref 26.0–34.0)
MCHC: 31 g/dL (ref 30.0–36.0)
MCV: 94.2 fL (ref 80.0–100.0)
Platelets: 263 10*3/uL (ref 150–400)
RBC: 2.74 MIL/uL — ABNORMAL LOW (ref 4.22–5.81)
RDW: 15.3 % (ref 11.5–15.5)
WBC: 7.6 10*3/uL (ref 4.0–10.5)
nRBC: 0.3 % — ABNORMAL HIGH (ref 0.0–0.2)

## 2019-07-31 NOTE — Progress Notes (Signed)
PROGRESS NOTE    [facility].  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 Patient is poor historian.  According to the patient, he has not been on blood thinners since he had that retroperitoneal bleed about 2 months ago. He had been otherwise doing well. He walks with a walker. Also has a history of seizure and is on multiple medications.   Patient had gone to dentist and had almost all of his teeth removed on 07/08/2019, following that he had continued bleeding into his mouth and they had talked to their dentist who recommended to control with a gauze piece. His wife has been trying to feed him with Gatorade and chicken soup but otherwise he has not been able to eat much. Patient states that 2 days ago he fell and hit his head had some pain but improved now.The reason he came to the hospital today is feeling dizzy and blood pressure dropping on ambulation.   Assessment & Plan:   Principal Problem:   Anemia due to blood loss, acute Active Problems:   HTN (hypertension)   Coronary artery disease involving native heart   Automatic implantable cardioverter-defibrillator in situ   Seizure (HCC)   Renal insufficiency   Factor VII deficiency (HCC)   Chronic systolic CHF (congestive heart failure), NYHA class 2 (HCC)   Chronic kidney disease (CKD), stage III (moderate) (HCC)   Oral bleeding   Coagulopathy (HCC)  Anemia due to acute blood loss, symptomatic anemia:     - s/p 1 unit  pRBCs hemoglobin of 8.0 today. Oral bleeding from recent dental procedure. Had been given FFP this admit with multiple doses of oral amicar. Dr. [name] oral surgery did  suture of oral incisions 8/2. Continue to monitor hemoglobin.  Transfuse if hgb <7.  Patient does have a history of factor VIII deficiency. Hem on on board and extensive blood work has been sent..  On full liquids at this time.  Will advance diet as tolerated.  Hypoglycemia.  Secondary to poor oral intake/reluctance to eat. Had hypoglycemia again today. On hypoglycemia protocol. Encouraged oral intake. Advance diet as tolerated.   Dizziness/lightheadedness    Likely secondary to blood loss.  Will closely monitor.  Resolved  Chronic congestive heart failure status post AICD:      -Blood pressure still borderline low so Aldactone, Coreg, nitrates/hydralazine have been on hold.  Continue amiodarone for rate control.   Chronic atrial fibrillation     - Had been rate controlled on amiodarone. Hold rest of the medications due to low blood pressure.  History of seizure disorder:          - Valproic acid level initially noted to be low, continue current regimen as tolerated.  On oral Depakote, Keppra.  Changed phenytoin to p.o.  Chronic kidney disease stage III due to hypertension:   At baseline.  Creatinine level of 0.9  Dementia Stable at this time.  Patient's wife makes decisions for him.  She does have HPOA on him.   Debility, deconditioning.  Patient has been seen by physical therapy, occupational therapy  recommended skilled  nursing facility for rehab. Patient however thinks he would be going home.  DVT prophylaxis: SCD's  Code Status: Full  Family Communication: I spoke with the patient's wife on the phone and updated about the clinical condition of the patient yesterday. She indicated that she would like her husband to be discharged to skilled nursing facility and is unable to care for him at home.   Disposition  Plan:  skilled nursing facility when bed is available pending COVID-19 test   Consultants:   Dental service Dr. [name]  Hematology  Procedures:  suture of oral incisions 8/2 by Dr. [name]  Antimicrobials: Anti-infectives (From admission, onward)   Start     Dose/Rate Route Frequency Ordered Stop   07/26/19 1602  ceFAZolin (ANCEF) 2-4 GM/100ML-% IVPB    Note to Pharmacy: Darlys Gales   : cabinet override      07/26/19 1602 07/26/19 1626   07/26/19 0845  ceFAZolin (ANCEF) IVPB 2g/100 mL premix     2 g 200 mL/hr over 30 Minutes Intravenous On call to O.R. 07/26/19 0973 07/26/19 1631   07/21/19 0845  amoxicillin (AMOXIL) capsule 500 mg     500 mg Oral Every 8 hours 07/21/19 0837 07/24/19 2203   07/20/19 2200  amoxicillin (AMOXIL) capsule 500 mg  Status:  Discontinued     500 mg Oral 2 times daily 07/20/19 1801 07/21/19 0837      Subjective: Today, nursing staff reports that his blood glucose level was again low this morning.  Patient complains of mild gingival bleeding at bedside.  He states that he wants to go home.  Objective: Vitals:   07/30/19 2004 07/31/19 0500 07/31/19 0613 07/31/19 1409  BP: 127/81  98/73 98/75  Pulse: 100  92 85  Resp: 18  18 18   Temp: 99.1 F (37.3 C)  98.3 F (36.8 C) 99 F (37.2 C)  TempSrc: Oral  Oral Axillary  SpO2: 100%  100% 100%  Weight:  98 kg    Height:        Intake/Output Summary (Last 24 hours) at 07/31/2019 1418 Last data filed at 07/31/2019 1326 Gross per 24 hour  Intake 1260 ml  Output 850 ml  Net 410 ml   Filed Weights   07/28/19 0500 07/29/19 0405 07/31/19 0500  Weight: 96.7 kg 98.3 kg 98 kg    Physical examination: General: Obese, not in obvious distress, alert awake and communicative. HENT: Edentulous.  Mild bloody stain on the gauze.  Normocephalic, pupils equally reacting to light and accommodation.  No scleral pallor or icterus noted. Oral mucosa is moist.  Chest:  Clear breath sounds.  Diminished breath sounds  bilaterally. No crackles or wheezes.  CVS: S1 &S2 heard. No murmur.  Irregular rhythm.  AICD. Abdomen: Soft, nontender, nondistended.  Bowel sounds are heard.  Liver is not palpable, no abdominal mass palpated Extremities: No cyanosis, clubbing but some pedal edema noted.  Peripheral pulses are palpable. Psych: Alert, awake and communicative.  History of dementia CNS:  No cranial nerve deficits.  Power equal in all extremities.  No sensory deficits noted.  No cerebellar signs.   Skin: Warm and dry.  No rashes noted.   Data Reviewed: I have personally reviewed following labs and imaging studies  CBC: Recent Labs  Lab 07/27/19 0317 07/28/19 0604 07/29/19 0335 07/30/19 0341 07/31/19 0329  WBC 11.3* 7.4 6.6 7.9 7.6  HGB 8.2* 7.6* 6.8* 7.7* 8.0*  HCT 26.9* 24.2* 22.8* 25.5* 25.8*  MCV 94.7 96.4 97.4 94.1 94.2  PLT 263 236 245 276 160   Basic Metabolic Panel: Recent Labs  Lab 07/25/19 1014 07/26/19 0831 07/28/19 0604 07/29/19 0335 07/30/19 0341  NA 132* 135 136 138 135  K 4.7 4.1 4.4 4.5 4.7  CL 103 103 106 108 107  CO2 21* 26 27 26 26   GLUCOSE 388* 103* 99 95 91  BUN 26* 20 11 11 9   CREATININE 1.56* 1.20 1.04 0.99 0.91  CALCIUM 7.7* 8.1* 7.9* 7.7* 7.7*  MG  --   --   --   --  1.8   GFR: Estimated Creatinine Clearance: 90.2 mL/min (by C-G formula based on SCr of 0.91 mg/dL). Liver Function Tests: No results for input(s): AST, ALT, ALKPHOS, BILITOT, PROT, ALBUMIN in the last 168 hours. No results for input(s): LIPASE, AMYLASE in the last 168 hours. No results for input(s): AMMONIA in the last 168 hours. Coagulation Profile: Recent Labs  Lab 07/29/19 2113  INR 1.3*   Cardiac Enzymes: No results for input(s): CKTOTAL, CKMB, CKMBINDEX, TROPONINI in the last 168 hours. BNP (last 3 results) Recent Labs    11/26/18 1025  PROBNP 56.0   HbA1C: No results for input(s): HGBA1C in the last 72 hours. CBG: Recent Labs  Lab 07/30/19 1643 07/30/19 2032 07/31/19 0738  07/31/19 0917 07/31/19 1134  GLUCAP 71 95 57* 70 88   Lipid Profile: No results for input(s): CHOL, HDL, LDLCALC, TRIG, CHOLHDL, LDLDIRECT in the last 72 hours. Thyroid Function Tests: No results for input(s): TSH, T4TOTAL, FREET4, T3FREE, THYROIDAB in the last 72 hours. Anemia Panel: Recent Labs    07/29/19 2113  FERRITIN 28   Sepsis Labs: No results for input(s): PROCALCITON, LATICACIDVEN in the last 168 hours.  No results found for this or any previous visit (from the past 240 hour(s)).   Radiology Studies: No results found.  Scheduled Meds: . sodium chloride   Intravenous Once  . sodium chloride   Intravenous Once  . amiodarone  200 mg Oral Daily  . divalproex  500 mg Oral Daily  . feeding supplement (ENSURE ENLIVE)  237 mL Oral TID BM  . insulin aspart  0-9 Units Subcutaneous TID AC & HS  . levETIRAcetam  1,000 mg Oral BID  .  morphine injection  2 mg Intravenous Once  . phenytoin  100 mg Oral BID  . spironolactone  25 mg Oral Daily   Continuous Infusions:    LOS: 10 days   Flora Lipps, MD [facility] Pager On Amion  If 7PM-7AM, please contact night-coverage 07/31/2019, 2:18 PM

## 2019-07-31 NOTE — Progress Notes (Signed)
Physical Therapy Treatment Patient Details Name: Louis Ford. MRN: [id number] DOB: [birth year] Today's Date: 07/31/2019    History of Present Illness Louis Ford. is a 73 y.o. 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    PT Comments    Assisted OOB to amb.  General bed mobility comments: Pt utilized bed rails to assist with HOB elevated.  Min cues for technique. General transfer comment: increased time and one VC safety with turns using walker.  General Gait Details: limited distance due to HR which was avg 88 at rest but quickly increased to 140's with activity.  RA avg 88   Follow Up Recommendations  Home health PT;Supervision/Assistance - 24 hour     Equipment Recommendations  Rolling walker with 5" wheels    Recommendations for Other Services       Precautions / Restrictions Precautions Precautions: Fall Precaution Comments: monitor HR Restrictions Weight Bearing Restrictions: No    Mobility  Bed Mobility Overal bed mobility: Needs Assistance Bed Mobility: Supine to Sit     Supine to sit: Supervision;HOB elevated     General bed mobility comments: Pt utilized bed rails to assist with HOB elevated.  Min cues for technique.  Transfers Overall transfer level: Needs assistance Equipment used: Rolling walker (2 wheeled) Transfers: Sit to/from Stand Sit to Stand: Supervision         General transfer comment: increased time and one VC safety with turns using walker  Ambulation/Gait Ambulation/Gait assistance: +2 safety/equipment;Supervision;Min guard Gait  Distance (Feet): 36 Feet Assistive device: Rolling walker (2 wheeled) Gait Pattern/deviations: Step-through pattern;Decreased stride length;Antalgic;Trunk flexed Gait velocity: decreased   General Gait Details: limited distance due to HR which was avg 88 at rest but quickly increased to 140's with activity.  RA avg 88   Stairs             Wheelchair Mobility    Modified Rankin (Stroke Patients Only)       Balance                                            Cognition Arousal/Alertness: Awake/alert Behavior During Therapy: WFL for tasks assessed/performed Overall Cognitive Status: Within Functional Limits for tasks assessed                                 General Comments: AxO x 3 pleasant      Exercises      General Comments        Pertinent Vitals/Pain Pain Assessment: Faces Faces Pain Scale: Hurts a little bit Pain Location: R knee Pain Descriptors / Indicators: Aching Pain Intervention(s): Monitored during session;Repositioned    Home Living                      Prior Function            PT Goals (current goals can now be found in the care plan section) Progress towards PT goals: Progressing toward goals  Frequency    Min 2X/week      PT Plan Current plan remains appropriate    Co-evaluation              AM-PAC PT "6 Clicks" Mobility   Outcome Measure  Help needed turning from your back to your side while in a flat bed without using bedrails?: A Little Help needed moving from lying on your back to sitting on the side of a flat bed without using bedrails?: A Little Help needed moving to and from a bed to a chair (including a wheelchair)?: A Little Help needed standing up from a chair using your arms (e.g., wheelchair or bedside chair)?: A Little Help needed to walk in hospital room?: A Little Help needed climbing 3-5 steps with a railing? : A Lot 6 Click Score: 17    End of Session  Equipment Utilized During Treatment: Gait belt Activity Tolerance: Patient tolerated treatment well Patient left: in bed;with call bell/phone within reach   PT Visit Diagnosis: Difficulty in walking, not elsewhere classified (R26.2);Muscle weakness (generalized) (M62.81)     Time: 4360-6770 PT Time Calculation (min) (ACUTE ONLY): 24 min  Charges:  $Gait Training: 8-22 mins $Therapeutic Activity: 8-22 mins                     Rica Koyanagi  PTA Acute  Rehabilitation Services Pager      [phone] Office      [phone]

## 2019-07-31 NOTE — Progress Notes (Signed)
Hypoglycemic Event  CBG: 57  Treatment: admin juice Symptoms: irritable  Follow-up CBG: Time: CBG Result:  Possible Reasons for Event: poor intake, bleeding gums Comments/MD notified:yes    Moira Umholtz

## 2019-07-31 NOTE — Progress Notes (Signed)
[name]   HEMATOLOGY/ONCOLOGY INPATIENT PROGRESS NOTE  Date of Service: 07/31/2019  Inpatient Attending: .Pokhrel, Corrie Mckusick, MD   SUBJECTIVE  Patient was seen in hematology followup today. No overt active gum bleeding noted no examination today. Minimal oozing on andoff. hgb improved to 8. Gums healing. No other evidence of non surgical bleeding. Patient frustated a little with liquid diet but understands the need. Recommended not chewing on the gauze since that can disrupt his gum wounds as well. Encouraged compliance with Amicar use as recommended- does not feel he is doing it as diligently as he could. Anxious about discharge to rehab center.  OBJECTIVE:  NAD  PHYSICAL EXAMINATION: . Vitals:   07/30/19 1342 07/30/19 2004 07/31/19 0500 07/31/19 0613  BP: 95/75 127/81  98/73  Pulse: 99 100  92  Resp: 18 18  18   Temp: 98.7 F (37.1 C) 99.1 F (37.3 C)  98.3 F (36.8 C)  TempSrc: Oral Oral  Oral  SpO2: 100% 100%  100%  Weight:   216 lb 0.8 oz (98 kg)   Height:       Filed Weights   07/28/19 0500 07/29/19 0405 07/31/19 0500  Weight: 213 lb 1.6 oz (96.7 kg) 216 lb 11.4 oz (98.3 kg) 216 lb 0.8 oz (98 kg)   .Body mass index is 30.15 kg/m.  GENERAL:alert, in no acute distress. SKIN: no acute rashes OROPHARYNX:no exudate, no erythema and lips, buccal mucosa, and tongue normal  NECK: supple, mild JVD LYMPH:  no palpable lymphadenopathy in the cervical, axillary or inguinal LUNGS: clear to auscultation with normal respiratory effort HEART: irregular rate & rhythm. 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   GIB (gastrointestinal bleeding) 02/20/2017   History of MRSA infection 05/2011   HTN (hypertension)    Hx of adenomatous colonic polyps 02/20/2017   01/2017 - 3 cm sigmoid TV adenoma and other smaller polyps - had post-polypectomy bleed Tx w/ clips Consider repeat colonoscopy 3 yrs [name], MD, [facility]    Hyperlipidemia    implantable cardiac defibrillator-Biotronik    Device Implanted 2006; s/p gen change 03/2011 : bleeding persistent with pocket erosion and infection; explant and reimplant  06/2011   Ischemic cardiomyopathy    EF 15 to 20% by TTE and TEE in 09/2012.  Severe LV dysfunction   Obesity    BMI 31 in 09/2012   Persistent atrial fibrillation with rapid ventricular response 04/21/2014   Retroperitoneal bleed 04/21/2019   Seizure disorder ([place]) latest 09/30/2012   Seizures ([place])    Stroke [facility])     SURGICAL HISTORY: Past Surgical History:  Procedure Laterality Date   ALVEOLOPLASTY N/A 07/26/2019   Procedure: ALVEOLOPLASTY WITH RESUTURING OF ORAL WOUND;  Surgeon: [name], [name];  Location: [facility];  Service: Oral Surgery;  Laterality: N/A;   CARDIAC CATHETERIZATION N/A 10/12/2015   Procedure: Right Heart Cath;  Surgeon: [name], MD;  Location: [place] [facility];  Service: Cardiovascular;  Laterality: N/A;   CARDIOVERSION  09/24/2012   Procedure:  CARDIOVERSION;  Surgeon: [name], MD;  Location: [place];  Service: Cardiovascular;  Laterality: N/A;   CARDIOVERSION N/A 12/25/2017   Procedure: CARDIOVERSION;  Surgeon: [name], MD;  Location: [facility];  Service: Cardiovascular;  Laterality: N/A;   COLONOSCOPY W/ POLYPECTOMY  02/13/2017   CORONARY ARTERY BYPASS GRAFT  2011   in [place] N/A 02/20/2017   Procedure: FLEXIBLE SIGMOIDOSCOPY;  Surgeon: [name], MD;  Location: [facility] ENDOSCOPY;   Service: Endoscopy;  Laterality: N/A;   FLEXIBLE SIGMOIDOSCOPY N/A 02/21/2017   Procedure: FLEXIBLE SIGMOIDOSCOPY;  Surgeon: [name], MD;  Location: [facility] ENDOSCOPY;  Service: Endoscopy;  Laterality: N/A;   ICD     TEE WITHOUT CARDIOVERSION  09/24/2012   Procedure: TRANSESOPHAGEAL ECHOCARDIOGRAM (TEE);  Surgeon: [name], MD;  Location: [facility] ENDOSCOPY;  Service: Cardiovascular;  Laterality: N/A;  dave/anesth, dl, cindy/echo     SOCIAL HISTORY: Social History   Socioeconomic History   Marital status: Married    Spouse name: Not on file   Number of children: Not on file   Years of education: Not on file   Highest education level: Not on file  Occupational History   Not on file  Social Needs   Financial resource strain: Not on file   Food insecurity    Worry: Not on file    Inability: Not on file   Transportation needs    Medical: Not on file    Non-medical: Not on file  Tobacco Use   Smoking status: Former Smoker    Types: Pipe    Quit date: 09/21/2008    Years since quitting: 10.8   Smokeless tobacco: Former Systems developer    Quit date: 09/21/2008  Substance and Sexual Activity   Alcohol use: No   Drug use: No   Sexual activity: Not Currently    Birth control/protection: None  Lifestyle   Physical activity    Days per week: Not on file    Minutes per session: Not on file   Stress: Not on file  Relationships   Social connections    Talks on phone: Not on file    Gets together: Not on file    Attends religious service: Not on file    Active member of club or organization: Not on file    Attends meetings of clubs or organizations: Not on file    Relationship status: Not on file   Intimate partner violence    Fear of current or ex partner: Not on file    Emotionally abused: Not on file    Physically abused: Not on file    Forced sexual activity: Not on file  Other Topics Concern   Not on file  Social History Narrative   ** Merged History Encounter  **        FAMILY HISTORY: Family History  Problem Relation Age of Onset   Arthritis Mother    Heart disease Mother    Heart attack Mother    Other Father        smoker   Hypertension Neg Hx        unknown   Stroke Neg Hx        unknown    ALLERGIES:  has No Known Allergies.  MEDICATIONS:  Scheduled Meds:  sodium chloride   Intravenous Once   sodium chloride   Intravenous Once   amiodarone  200 mg Oral Daily   divalproex  500 mg Oral Daily  feeding supplement (ENSURE ENLIVE)  237 mL Oral TID BM   insulin aspart  0-9 Units Subcutaneous TID AC & HS   levETIRAcetam  1,000 mg Oral BID    morphine injection  2 mg Intravenous Once   phenytoin  100 mg Oral BID   spironolactone  25 mg Oral Daily   Continuous Infusions: PRN Meds:.acetaminophen, aminocaproic acid, labetalol, morphine injection, morphine injection, ondansetron **OR** ondansetron (ZOFRAN) IV, oxyCODONE-acetaminophen, polyethylene glycol, sodium chloride flush  REVIEW OF SYSTEMS:    10 Point review of Systems was done is negative except as noted above.   LABORATORY DATA:  I have reviewed the data as listed  . CBC Latest Ref Rng & Units 07/31/2019 07/30/2019 07/29/2019  WBC 4.0 - 10.5 K/uL 7.6 7.9 6.6  Hemoglobin 13.0 - 17.0 g/dL 8.0(L) 7.7(L) 6.8(LL)  Hematocrit 39.0 - 52.0 % 25.8(L) 25.5(L) 22.8(L)  Platelets 150 - 400 K/uL 263 276 245   . CBC    Component Value Date/Time   WBC 7.6 07/31/2019 0329   RBC 2.74 (L) 07/31/2019 0329   HGB 8.0 (L) 07/31/2019 0329   HCT 25.8 (L) 07/31/2019 0329   PLT 263 07/31/2019 0329   MCV 94.2 07/31/2019 0329   MCH 29.2 07/31/2019 0329   MCHC 31.0 07/31/2019 0329   RDW 15.3 07/31/2019 0329   LYMPHSABS 1.1 07/22/2019 0613   MONOABS 0.9 07/22/2019 0613   EOSABS 0.1 07/22/2019 0613   BASOSABS 0.0 07/22/2019 0613     . CMP Latest Ref Rng & Units 07/30/2019 07/29/2019 07/28/2019  Glucose 70 - 99 mg/dL 91 95 99  BUN 8 - 23 mg/dL 9 11 11   Creatinine 0.61 -  1.24 mg/dL 0.91 0.99 1.04  Sodium 135 - 145 mmol/L 135 138 136  Potassium 3.5 - 5.1 mmol/L 4.7 4.5 4.4  Chloride 98 - 111 mmol/L 107 108 106  CO2 22 - 32 mmol/L 26 26 27   Calcium 8.9 - 10.3 mg/dL 7.7(L) 7.7(L) 7.9(L)  Total Protein 6.5 - 8.1 g/dL - - -  Total Bilirubin 0.3 - 1.2 mg/dL - - -  Alkaline Phos 38 - 126 U/L - - -  AST 15 - 41 U/L - - -  ALT 0 - 44 U/L - - -   Component     Latest Ref Rng & Units 10/02/2012 10/03/2012  Prothrombin Time     11.4 - 15.2 seconds    INR     0.8 - 1.2    APTT     24 - 36 seconds    Fibrinogen     210 - 475 mg/dL    D-Dimer, Quant     0.00 - 0.50 ug/mL-FEU    Platelets     150 - 400 K/uL    Smear Review         PTT     <30 seconds  30 (H)  1 HR INCUB PT 1:1NP     seconds  28  Patient post incubation     seconds  35  Patient-1/1, Incubated Mix-PTT     seconds  34  Post inc 1:1 mix, npp + pt     seconds  32  Interpretation-PTT Mixing Study       INHIBITOR NOT DETECTED  Protime     8.9 - 12.1 seconds  13.3 (H)  Patient-1/1, Immediate Mix-PT     seconds  11.5  Patient post incubation-ptmix     seconds  13.3  Patient-1/1, Incubated Mix-PT     seconds  12.1  Post inc 1:1 mix, npp + pt-ptmix     seconds  11.9  PT, Mixing Interp       INHIBITOR NOT DETECTED  Coagulation Factor VIII     73 - 140 % 276 (H)   Ristocetin Co-factor, Plasma     42 - 200 % 166   Von Willebrand Antigen, Plasma     50 - 217 % 192   Factor VII Activity     51 - 186 % 32 (L)    Component     Latest Ref Rng & Units 10/09/2012 07/22/2019  Prothrombin Time     11.4 - 15.2 seconds  15.4 (H)  INR     0.8 - 1.2  1.2  APTT     24 - 36 seconds  37 (H)  Fibrinogen     210 - 475 mg/dL  299  D-Dimer, Quant     0.00 - 0.50 ug/mL-FEU  0.57 (H)  Platelets     150 - 400 K/uL  152  Smear Review       NO SCHISTOCYTES SEEN  PTT     <30 seconds    1 HR INCUB PT 1:1NP     seconds    Patient post incubation     seconds    Patient-1/1, Incubated Mix-PTT      seconds    Post inc 1:1 mix, npp + pt     seconds    Interpretation-PTT Mixing Study         Protime     8.9 - 12.1 seconds    Patient-1/1, Immediate Mix-PT     seconds    Patient post incubation-ptmix     seconds    Patient-1/1, Incubated Mix-PT     seconds    Post inc 1:1 mix, npp + pt-ptmix     seconds    PT, Mixing Interp         Coagulation Factor VIII     73 - 140 %    Ristocetin Co-factor, Plasma     42 - 200 %    Von Willebrand Antigen, Plasma     50 - 217 %    Factor VII Activity     51 - 186 % 60 69   Component     Latest Ref Rng & Units 07/29/2019  Prothrombin Time     11.4 - 15.2 seconds 15.5 (H)  INR     0.8 - 1.2 1.3 (H)  APTT     24 - 36 seconds 48 (H)  Fibrinogen     210 - 475 mg/dL 357    RADIOGRAPHIC STUDIES: I have personally reviewed the radiological images as listed and agreed with the findings in the report. Ct Head Wo Contrast  Result Date: 07/20/2019 CLINICAL DATA:  Pain following recent trauma EXAM: CT HEAD WITHOUT CONTRAST TECHNIQUE: Contiguous axial images were obtained from the base of the skull through the vertex without intravenous contrast. COMPARISON:  December 05, 2018 FINDINGS: Brain: There is stable age related volume loss. There is no intracranial mass, hemorrhage, extra-axial fluid collection, or midline shift. There is small vessel disease in the centra semiovale bilaterally which appear stable. No acute infarct evident. Vascular: No hyperdense vessel. There is calcification in each carotid siphon region as well as in each middle cerebral artery. Skull: Bony calvarium appears intact. Sinuses/Orbits: Visualized paranasal sinuses are clear. Orbits appear symmetric bilaterally. Other: Mastoid air cells are clear. IMPRESSION: Loss with patchy periventricular small  vessel disease. No acute infarct. No mass or hemorrhage. There are multiple foci of arterial vascular calcification. Electronically Signed   By: Lowella Grip III M.D.   On:  07/20/2019 14:12   Dg Chest Port 1 View  Result Date: 07/25/2019 CLINICAL DATA:  Patient became unresponsive with exertion. EXAM: PORTABLE CHEST 1 VIEW COMPARISON:  Chest CT, 04/21/2019.  Chest radiographs, 12/07/2019. FINDINGS: There stable changes from prior cardiac surgery. Cardiac silhouette is mildly enlarged. No mediastinal or hilar masses. Lungs are clear.  No pleural effusion or pneumothorax. Right anterior chest wall single lead pacemaker is stable. Skeletal structures are grossly intact. IMPRESSION: No acute cardiopulmonary disease. Electronically Signed   By: Lajean Manes M.D.   On: 07/25/2019 16:57   [place] Ekg Site Rite  Result Date: 07/25/2019 If Site Rite image not attached, placement could not be confirmed due to current cardiac rhythm.   ASSESSMENT & PLAN:   73 yo patient with multiple medical co-morbidities included afib, severe systolic CHF, CVA with ?? Mid factor VII deficiency with previous post-op bleeding issues with pacemaker placement, TEE. No issues with spontaneous bleeding at baseline.[name]  1) Post operative dental/gum bleeding requiring transfusions and rpt Alveoloplasty and thrombin soaked gel foam application on .  No clinically significant bleeding noted at this time. hgb has stabilized at 8 Drop in hgb to 6.8 previously likely reflecitng operative blood loss.  2) ? Factor VII deficiency - vs coumadin effect in past. FVII levels 69% which is WNL Platelet function assay - WNL PLAN -rpt PT- slight elevation, aPTT- elevated -PT and PTT mixing studies though previously no concern with factor inhibitor. -,thrombin time, VWD panel, Factor XIII, factor 2,5 and 10 -- ordered --pending. Will f/u as outpatient -some concern for possible CPVC of liver and possible related coaguloapthy causing mild elevation of PT and aPTT--- would support vit K dep clotting factors with vit K 5mg  po daily for 10-14 days. -also possibility of dysfibrinogenemia -consider inpatient vs  outpatient [place] abd for evaluation of liver -not on anticoagulation for afib- due to significant bleeding issues -no indication for rFVIIa or [facility] replacement at this time which is his case is not clearly indicated and would be high risk for thrombosis. -significant gum disease serving as a local risk factor for bleeding issues -would continue Amicar soaks Hold 4 g/10 mL in mouth for 2-3 minutes then spit out. Repeat every 4-6 hours. Not to swish vigorously to  Avoid dislodging clots. Would avoid systemic use of antifibrinolytics given CHF and afib and increased risk of CVA and thrombosis. -primary focus should be on optimizing local interventions by dental and addressing gum disease.  3) Iron def -would given IV feraheme 510mg  with a goal to keep ferritin >100 esp in setting of systolic CHF  Hematology f/u as outpatient in 3-4 weeks    I spent 25 minutes counseling the patient face to face. The total time spent in the appointment was 45 minutes and more than 50% was on counseling and direct patient cares.    Sullivan Lone MD [place] AAHIVMS [facility] Hematology/Oncology Physician [facility]  (Office):       [phone] (Work cell):  [phone] (Fax):           [phone]  07/31/2019 9:42 AM

## 2019-07-31 NOTE — TOC Progression Note (Signed)
Transition of Care (TOC) - Progression Note    Patient Details  Name: [facility]. MRN: [id number] Date of Birth: 05/18/1946  Transition of Care [facility]) CM/SW [place], [place] Phone Number: [phone] 07/31/2019, 12:54 PM  Clinical Narrative:   Plan for pt to admit to [facility] SNF at [facility]- awaiting repeat COVID test result. Pt has been hesitant for SNF admission however he and wife discussed again yesterday and today and he is agreeable- wife feels SNF environment has been better for rehab than home health.  Pt had approved authorization from [facility] Medicare for admission earlier this week, will need updated auth if expires prior to covid test resulting/DC.  (Inquiring with facility date of patient's auth expiring)         Expected Discharge Plan and Services           Expected Discharge Date: (unknown)                                     Social Determinants of Health (SDOH) Interventions    Readmission Risk Interventions No flowsheet data found.

## 2019-08-01 LAB — VON WILLEBRAND PANEL
Coagulation Factor VIII: 182 % — ABNORMAL HIGH (ref 56–140)
Ristocetin Co-factor, Plasma: 91 % (ref 50–200)
Von Willebrand Antigen, Plasma: 117 % (ref 50–200)

## 2019-08-01 LAB — GLUCOSE, CAPILLARY
Glucose-Capillary: 43 mg/dL — CL (ref 70–99)
Glucose-Capillary: 64 mg/dL — ABNORMAL LOW (ref 70–99)
Glucose-Capillary: 85 mg/dL (ref 70–99)
Glucose-Capillary: 93 mg/dL (ref 70–99)
Glucose-Capillary: 84 mg/dL (ref 70–99)

## 2019-08-01 LAB — COAG STUDIES INTERP REPORT

## 2019-08-01 MED ORDER — DEXTROSE 5 % IV SOLN
INTRAVENOUS | Status: AC
  Administered 2019-08-01: 19:00:00 via INTRAVENOUS

## 2019-08-01 MED ORDER — DEXTROSE 50 % IV SOLN
INTRAVENOUS | Status: AC
  Filled 2019-08-01: qty 50

## 2019-08-01 NOTE — Progress Notes (Signed)
PROGRESS NOTE  [facility]. 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 Patient is poor historian.  According to the patient, he has not been on blood thinners since he had that retroperitoneal bleed about 2 months ago. He had been otherwise doing well. He walks with a walker. Also has a history of seizure and is on multiple medications.   Patient had gone to dentist and had almost all of his teeth removed on 07/08/2019, following that he had continued bleeding into his mouth and they had talked to their dentist who recommended to control with a gauze piece. His wife has been trying to feed him with Gatorade and chicken soup but otherwise he has not been able to eat much. His blood pressure dropped and he started feeling dizzy and presented to the ED.   Subjective: Patient was seen and examined this morning. There is an elderly African-American male.  Not in distress.  He had a bowel gauze and using mouth because he states he started bleeding again this morning.  Assessment/Plan:  Principal Problem:   Anemia due to blood loss, acute Active Problems:   HTN (hypertension)   Coronary artery disease involving native heart   Automatic implantable cardioverter-defibrillator in situ   Seizure (HCC)   Renal insufficiency   Factor VII deficiency (HCC)   Chronic systolic CHF (congestive heart failure), NYHA class 2 (HCC)   Chronic kidney disease (CKD), stage III  (moderate) (HCC)   Oral bleeding   Coagulopathy (HCC)  Anemia due to acute blood loss, symptomatic anemia: - s/p 1 unit pRBC, hemoglobin was 8 on last check on 8/7. Oral bleeding from recent dental procedure. Had been given FFP this admit with multiple doses of oral amicar. Dr. [name] oral surgery did  suture of oral incisions 8/2. Continue to monitor hemoglobin.  Repeat tomorrow. Transfuse if hgb <7.  Patient does have a history of factor VIII deficiency. Hem on on board and extensive blood work has been sent..  On full liquids at this time.  Will advance diet as tolerated.  Hypoglycemia.  Secondary to poor oral intake/reluctance to eat. Had hypoglycemia again today.  Blood sugar was low at 43. On hypoglycemia protocol. Encouraged oral intake.  Currently on full liquid diet only. Start on D5 drip at 50 mils per hour.   Dizziness/lightheadedness Likely secondary to blood loss.  Resolved.  Chronic congestive heart failure status post AICD:  -Blood pressure still borderline low so Aldactone, Coreg, nitrates/hydralazine have been on hold.  Continue amiodarone for rate control.   Chronic atrial fibrillation - Had been rate controlled on amiodarone. Hold rest of the medications due to low blood pressure.  History of seizure disorder:  - Valproic acid level initially noted to be low, continue current regimen as tolerated.  On oral Depakote, Keppra.  Changed phenytoin to p.o.  Chronic kidney disease stage III due to hypertension:  At baseline.  Creatinine level of 0.9  Dementia Stable at this time.  Patient's wife makes decisions for him.  She does have HPOA on him.  Debility, deconditioning.  Patient has been seen by physical therapy, occupational therapy  recommended skilled nursing facility for rehab.   Mobility: Encourage ambulation.  SNF/rehab recommended. Diet: Full liquid diet DVT prophylaxis:  SCDs Code Status:   Code Status: Full Code  Family  Communication:  Expected Discharge:  Pending SNF rehab Consultants:  Oral surgery  Procedures:    Antimicrobials: Anti-infectives (From admission, onward)   Start     Dose/Rate Route Frequency Ordered Stop   07/26/19 1602  ceFAZolin (ANCEF) 2-4 GM/100ML-% IVPB    Note to Pharmacy: Darlys Gales   : cabinet override      07/26/19 1602 07/26/19 1626   07/26/19 0845  ceFAZolin (ANCEF) IVPB 2g/100 mL premix     2 g 200 mL/hr over 30 Minutes Intravenous On call to O.R. 07/26/19 4128 07/26/19 1631   07/21/19 0845  amoxicillin (AMOXIL) capsule 500 mg     500 mg Oral Every 8 hours 07/21/19 0837 07/24/19 2203   07/20/19 2200  amoxicillin (AMOXIL) capsule 500 mg  Status:  Discontinued     500 mg Oral 2 times daily 07/20/19 1801 07/21/19 0837      Infusions:    Scheduled Meds: . sodium chloride   Intravenous Once  . sodium chloride   Intravenous Once  . amiodarone  200 mg Oral Daily  . dextrose      . divalproex  500 mg Oral Daily  . feeding supplement (ENSURE ENLIVE)  237 mL Oral TID BM  . insulin aspart  0-9 Units Subcutaneous TID AC & HS  . levETIRAcetam  1,000 mg Oral BID  .  morphine injection  2 mg Intravenous Once  . phenytoin  100 mg Oral BID  . spironolactone  25 mg Oral Daily    PRN meds: acetaminophen, aminocaproic acid, labetalol, morphine injection, ondansetron **OR** ondansetron (ZOFRAN) IV, oxyCODONE-acetaminophen, polyethylene glycol, sodium chloride flush   Objective: Vitals:   08/01/19 0553 08/01/19 1328  BP: 98/72 96/71  Pulse: 95 (!) 55  Resp: 20 16  Temp: 98.4 F (36.9 C) (!) 97.5 F (36.4 C)  SpO2: 98% 100%    Intake/Output Summary (Last 24 hours) at 08/01/2019 1622 Last data filed at 08/01/2019 0900 Gross per 24 hour  Intake 700 ml  Output 775 ml  Net -75 ml   Filed Weights   07/28/19 0500 07/29/19 0405 07/31/19 0500  Weight: 96.7 kg 98.3 kg 98 kg   Weight change:  Body mass index is 30.15 kg/m.   Physical Exam: General exam: Appears  calm and comfortable.  Skin: No rashes, lesions or ulcers. HEENT: Atraumatic, normocephalic, supple neck, no obvious bleeding Lungs: Clear to auscultation bilaterally CVS: Regular rate and rhythm, no murmur GI/Abd soft, nontender, nondistended, bowel sound present CNS: Alert, awake, oriented x3 Psychiatry: Mood appropriate Extremities: No pedal edema, no calf tenderness  Data Review: I have personally reviewed the laboratory data and studies available.  Recent Labs  Lab 07/27/19 0317 07/28/19 0604 07/29/19 0335 07/30/19 0341 07/31/19 0329  WBC 11.3* 7.4 6.6 7.9 7.6  HGB 8.2* 7.6* 6.8* 7.7* 8.0*  HCT 26.9* 24.2* 22.8* 25.5* 25.8*  MCV 94.7 96.4 97.4 94.1 94.2  PLT 263 236 245 276 263   Recent Labs  Lab 07/26/19 0831 07/28/19 0604 07/29/19 0335 07/30/19 0341  NA 135 136 138 135  K 4.1 4.4 4.5 4.7  CL 103 106 108 107  CO2 26 27 26 26   GLUCOSE 103* 99 95 91  BUN 20 11 11  9  CREATININE 1.20 1.04 0.99 0.91  CALCIUM 8.1* 7.9* 7.7* 7.7*  MG  --   --   --  1.8    Terrilee Croak, MD  [facility] 08/01/2019

## 2019-08-02 LAB — MULTIPLE MYELOMA PANEL, SERUM
Albumin SerPl Elph-Mcnc: 2.5 g/dL — ABNORMAL LOW (ref 2.9–4.4)
Albumin/Glob SerPl: 0.8 (ref 0.7–1.7)
Alpha 1: 0.2 g/dL (ref 0.0–0.4)
Alpha2 Glob SerPl Elph-Mcnc: 0.5 g/dL (ref 0.4–1.0)
B-Globulin SerPl Elph-Mcnc: 0.5 g/dL — ABNORMAL LOW (ref 0.7–1.3)
Gamma Glob SerPl Elph-Mcnc: 2.2 g/dL — ABNORMAL HIGH (ref 0.4–1.8)
Globulin, Total: 3.5 g/dL (ref 2.2–3.9)
IgA: 320 mg/dL (ref 61–437)
IgG (Immunoglobin G), Serum: 2155 mg/dL — ABNORMAL HIGH (ref 603–1613)
IgM (Immunoglobulin M), Srm: 10 mg/dL — ABNORMAL LOW (ref 20–172)
M Protein SerPl Elph-Mcnc: 1.6 g/dL — ABNORMAL HIGH
Total Protein ELP: 6 g/dL (ref 6.0–8.5)

## 2019-08-02 LAB — CBC WITH DIFFERENTIAL/PLATELET
Abs Immature Granulocytes: 0.02 10*3/uL (ref 0.00–0.07)
Basophils Absolute: 0 10*3/uL (ref 0.0–0.1)
Basophils Relative: 0 %
Eosinophils Absolute: 0.4 10*3/uL (ref 0.0–0.5)
Eosinophils Relative: 6 %
HCT: 28.4 % — ABNORMAL LOW (ref 39.0–52.0)
Hemoglobin: 8.6 g/dL — ABNORMAL LOW (ref 13.0–17.0)
Immature Granulocytes: 0 %
Lymphocytes Relative: 16 %
Lymphs Abs: 0.9 10*3/uL (ref 0.7–4.0)
MCH: 28.4 pg (ref 26.0–34.0)
MCHC: 30.3 g/dL (ref 30.0–36.0)
MCV: 93.7 fL (ref 80.0–100.0)
Monocytes Absolute: 1 10*3/uL (ref 0.1–1.0)
Monocytes Relative: 18 %
Neutro Abs: 3.5 10*3/uL (ref 1.7–7.7)
Neutrophils Relative %: 60 %
Platelets: 304 10*3/uL (ref 150–400)
RBC: 3.03 MIL/uL — ABNORMAL LOW (ref 4.22–5.81)
RDW: 15.1 % (ref 11.5–15.5)
WBC: 5.9 10*3/uL (ref 4.0–10.5)
nRBC: 0.3 % — ABNORMAL HIGH (ref 0.0–0.2)

## 2019-08-02 LAB — GLUCOSE, CAPILLARY
Glucose-Capillary: 69 mg/dL — ABNORMAL LOW (ref 70–99)
Glucose-Capillary: 75 mg/dL (ref 70–99)
Glucose-Capillary: 99 mg/dL (ref 70–99)
Glucose-Capillary: 95 mg/dL (ref 70–99)
Glucose-Capillary: 87 mg/dL (ref 70–99)
Glucose-Capillary: 64 mg/dL — ABNORMAL LOW (ref 70–99)
Glucose-Capillary: 82 mg/dL (ref 70–99)
Glucose-Capillary: 133 mg/dL — ABNORMAL HIGH (ref 70–99)

## 2019-08-02 LAB — BASIC METABOLIC PANEL
Anion gap: 3 — ABNORMAL LOW (ref 5–15)
BUN: 10 mg/dL (ref 8–23)
CO2: 25 mmol/L (ref 22–32)
Calcium: 7.9 mg/dL — ABNORMAL LOW (ref 8.9–10.3)
Chloride: 108 mmol/L (ref 98–111)
Creatinine, Ser: 1.14 mg/dL (ref 0.61–1.24)
GFR calc Af Amer: >60 mL/min (ref >60)
GFR calc non Af Amer: >60 mL/min (ref >60)
Glucose, Bld: 120 mg/dL — ABNORMAL HIGH (ref 70–99)
Potassium: 4.8 mmol/L (ref 3.5–5.1)
Sodium: 136 mmol/L (ref 135–145)

## 2019-08-02 LAB — FACTOR 13 ACTIVITY: Factor XIII, Qualitative: NORMAL

## 2019-08-02 MED ORDER — DEXTROSE 50 % IV SOLN
INTRAVENOUS | Status: AC
  Administered 2019-08-02: 25 mL
  Filled 2019-08-02: qty 50

## 2019-08-02 NOTE — Progress Notes (Signed)
PROGRESS NOTE  [facility]. 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 Patient is poor historian.  According to the patient, he has not been on blood thinners since he had that retroperitoneal bleed about 2 months ago. He had been otherwise doing well. He walks with a walker. Also has a history of seizure and is on multiple medications.   Patient had gone to dentist and had almost all of his teeth removed on 07/08/2019, following that he had continued bleeding into his mouth and they had talked to their dentist who recommended to control with a gauze piece. His wife has been trying to feed him with Gatorade and chicken soup but otherwise he has not been able to eat much. His blood pressure dropped and he started feeling dizzy and presented to the ED.   Subjective: Patient was seen and examined this morning. There is an elderly African-American male.  Not in distress.  He continues to hold or gauze in his mouth because he says he is a still bleeding.  Assessment/Plan:  Principal Problem:   Anemia due to blood loss, acute Active Problems:   HTN (hypertension)   Coronary artery disease involving native heart   Automatic implantable cardioverter-defibrillator in situ   Seizure (HCC)   Renal insufficiency   Factor VII deficiency (HCC)   Chronic systolic CHF (congestive heart failure), NYHA class 2 (HCC)   Chronic kidney disease (CKD), stage III (moderate)  (HCC)   Oral bleeding   Coagulopathy (HCC)  Anemia due to acute blood loss, symptomatic anemia: - s/p 1 unit pRBC, hemoglobin was 8 on last check on 8/7. Oral bleeding from recent dental procedure. Had been given FFP this admit with multiple doses of oral amicar. Dr. [name] oral surgery did  suture of oral incisions 8/2.  Hemoglobin 8.7 today.  Is stable.  Continue to monitor hemoglobin.  Repeat tomorrow. Transfuse if hgb <7.  Patient does have a history of factor VIII deficiency.  Hematology on board and extensive blood work has been sent..  On full liquids at this time.  Will advance to soft diet.  Hypoglycemia.  Secondary to poor oral intake/reluctance to eat.  Patient is on full liquid diet and is reluctant to advance to soft diet because of the risk of completing getting worse.  He has had episodes of hypoglycemia.  Blood sugar level was as low as 43 yesterday. I started him on  IV dextrose solution yesterday. On hypoglycemia protocol. Encouraged oral intake.  Currently on full liquid diet only. Start on D5 drip at 50 mils per hour.   Dizziness/lightheadedness Likely secondary to blood loss.  Resolved.  Chronic congestive heart failure status post AICD:  -Blood pressure still borderline low so Aldactone, Coreg, nitrates/hydralazine have been on hold.   Chronic atrial fibrillation - Had been rate controlled on amiodarone.  Continue amiodarone for rate control.  Hold rest of the medications due to low blood pressure.   History of seizure disorder:  - Valproic acid level initially noted to be low, continue current regimen as tolerated.  On oral Depakote, Keppra.  Changed phenytoin to p.o.  Chronic kidney disease stage III due to hypertension: Creatinine at baseline close to 1.  Dementia Stable at this time.  Patient's wife makes decisions for him.  She does have HPOA on him.   Debility, deconditioning.  Patient has been seen by physical therapy, occupational  therapy  recommended skilled nursing facility for rehab.   Mobility: Encourage ambulation.  SNF/rehab recommended. Diet: Full liquid diet DVT prophylaxis:  SCDs Code Status:   Code Status: Full Code  Family Communication:  Expected Discharge:  Pending SNF rehab Consultants:  Oral surgery  Procedures:    Antimicrobials: Anti-infectives (From admission, onward)   Start     Dose/Rate Route Frequency Ordered Stop   07/26/19 1602  ceFAZolin (ANCEF) 2-4 GM/100ML-% IVPB    Note to Pharmacy: Darlys Gales   : cabinet override      07/26/19 1602 07/26/19 1626   07/26/19 0845  ceFAZolin (ANCEF) IVPB 2g/100 mL premix     2 g 200 mL/hr over 30 Minutes Intravenous On call to O.R. 07/26/19 4132 07/26/19 1631   07/21/19 0845  amoxicillin (AMOXIL) capsule 500 mg     500 mg Oral Every 8 hours 07/21/19 0837 07/24/19 2203   07/20/19 2200  amoxicillin (AMOXIL) capsule 500 mg  Status:  Discontinued     500 mg Oral 2 times daily 07/20/19 1801 07/21/19 0837      Infusions:  . dextrose 50 mL/hr at 08/02/19 0400    Scheduled Meds: . sodium chloride   Intravenous Once  . sodium chloride   Intravenous Once  . amiodarone  200 mg Oral Daily  . divalproex  500 mg Oral Daily  . feeding supplement (ENSURE ENLIVE)  237 mL Oral TID BM  . insulin aspart  0-9 Units Subcutaneous TID AC & HS  . levETIRAcetam  1,000 mg Oral BID  .  morphine injection  2 mg Intravenous Once  . phenytoin  100 mg Oral BID  . spironolactone  25 mg Oral Daily    PRN meds: acetaminophen, aminocaproic acid, labetalol, morphine injection, ondansetron **OR** ondansetron (ZOFRAN) IV, oxyCODONE-acetaminophen, polyethylene glycol, sodium chloride flush   Objective: Vitals:   08/01/19 2322 08/02/19 0641  BP: 109/76 98/66  Pulse: 87 92  Resp:  17  Temp:  98.4 F (36.9 C)  SpO2: 100% 100%    Intake/Output Summary (Last 24 hours) at 08/02/2019 1250 Last data filed at 08/02/2019 0900 Gross per 24 hour  Intake 1158.39 ml   Output 2450 ml  Net -1291.61 ml   Filed Weights   07/29/19 0405 07/31/19 0500 08/02/19 0641  Weight: 98.3 kg 98 kg 97.7 kg   Weight change:  Body mass index is 30.05 kg/m.   Physical Exam: General exam: Appears calm and comfortable.  Skin: No rashes, lesions or ulcers. HEENT: Atraumatic, normocephalic, has a gauze held in his gum. Lungs: Clear to auscultation bilaterally CVS: Regular rate and rhythm, no murmur GI/Abd soft, nontender, nondistended, bowel sound present CNS: Alert, awake, oriented x3 Psychiatry: Mood appropriate Extremities: No pedal edema, no calf tenderness  Data Review: I have personally reviewed the laboratory data and studies available.  Recent Labs  Lab 07/28/19 0604 07/29/19 0335 07/30/19 0341 07/31/19 0329 08/02/19 0328  WBC 7.4 6.6 7.9 7.6 5.9  NEUTROABS  --   --   --   --  3.5  HGB 7.6* 6.8* 7.7* 8.0* 8.6*  HCT 24.2* 22.8* 25.5* 25.8* 28.4*  MCV 96.4 97.4 94.1 94.2  93.7  PLT 236 245 276 263 304   Recent Labs  Lab 07/28/19 0604 07/29/19 0335 07/30/19 0341 08/02/19 0328  NA 136 138 135 136  K 4.4 4.5 4.7 4.8  CL 106 108 107 108  CO2 27 26 26 25   GLUCOSE 99 95 91 120*  BUN 11 11 9 10   CREATININE 1.04 0.99 0.91 1.14  CALCIUM 7.9* 7.7* 7.7* 7.9*  MG  --   --  1.8  --     Terrilee Croak, MD  [facility] 08/02/2019

## 2019-08-03 LAB — CBC WITH DIFFERENTIAL/PLATELET
Abs Immature Granulocytes: 0.01 10*3/uL (ref 0.00–0.07)
Basophils Absolute: 0 10*3/uL (ref 0.0–0.1)
Basophils Relative: 1 %
Eosinophils Absolute: 0.3 10*3/uL (ref 0.0–0.5)
Eosinophils Relative: 6 %
HCT: 28.8 % — ABNORMAL LOW (ref 39.0–52.0)
Hemoglobin: 8.7 g/dL — ABNORMAL LOW (ref 13.0–17.0)
Immature Granulocytes: 0 %
Lymphocytes Relative: 18 %
Lymphs Abs: 1.1 10*3/uL (ref 0.7–4.0)
MCH: 28.2 pg (ref 26.0–34.0)
MCHC: 30.2 g/dL (ref 30.0–36.0)
MCV: 93.5 fL (ref 80.0–100.0)
Monocytes Absolute: 1.1 10*3/uL — ABNORMAL HIGH (ref 0.1–1.0)
Monocytes Relative: 19 %
Neutro Abs: 3.2 10*3/uL (ref 1.7–7.7)
Neutrophils Relative %: 56 %
Platelets: 292 10*3/uL (ref 150–400)
RBC: 3.08 MIL/uL — ABNORMAL LOW (ref 4.22–5.81)
RDW: 14.8 % (ref 11.5–15.5)
WBC: 5.7 10*3/uL (ref 4.0–10.5)
nRBC: 0 % (ref 0.0–0.2)

## 2019-08-03 LAB — BASIC METABOLIC PANEL
Anion gap: 4 — ABNORMAL LOW (ref 5–15)
BUN: 10 mg/dL (ref 8–23)
CO2: 24 mmol/L (ref 22–32)
Calcium: 7.8 mg/dL — ABNORMAL LOW (ref 8.9–10.3)
Chloride: 107 mmol/L (ref 98–111)
Creatinine, Ser: 1.04 mg/dL (ref 0.61–1.24)
GFR calc Af Amer: >60 mL/min (ref >60)
GFR calc non Af Amer: >60 mL/min (ref >60)
Glucose, Bld: 80 mg/dL (ref 70–99)
Potassium: 5.3 mmol/L — ABNORMAL HIGH (ref 3.5–5.1)
Sodium: 135 mmol/L (ref 135–145)

## 2019-08-03 LAB — GLUCOSE, CAPILLARY
Glucose-Capillary: 68 mg/dL — ABNORMAL LOW (ref 70–99)
Glucose-Capillary: 98 mg/dL (ref 70–99)
Glucose-Capillary: 70 mg/dL (ref 70–99)
Glucose-Capillary: 50 mg/dL — ABNORMAL LOW (ref 70–99)
Glucose-Capillary: 114 mg/dL — ABNORMAL HIGH (ref 70–99)

## 2019-08-03 LAB — FACTOR 10 ASSAY: Factor X Activity: 95 % (ref 76–183)

## 2019-08-03 LAB — THROMBIN TIME: Thrombin Time: 17.8 s (ref 0.0–23.0)

## 2019-08-03 LAB — FACTOR 2 ASSAY: Factor II Activity: 82 % (ref 50–154)

## 2019-08-03 LAB — FACTOR 5 ASSAY: Factor V Activity: 64 % — ABNORMAL LOW (ref 70–150)

## 2019-08-03 MED ORDER — DEXTROSE 50 % IV SOLN
INTRAVENOUS | Status: AC
  Administered 2019-08-03: 23:00:00
  Filled 2019-08-03: qty 50

## 2019-08-03 MED ORDER — PHYTONADIONE 5 MG PO TABS
5.0000 mg | ORAL_TABLET | Freq: Every day | ORAL | Status: AC
  Administered 2019-08-03 – 2019-08-16 (×14): 5 mg via ORAL
  Filled 2019-08-03 (×14): qty 1

## 2019-08-03 NOTE — Progress Notes (Signed)
PROGRESS NOTE  [name].  DOB: [birth year]  PCP: [name], MD IRC:[id number]  DOA: 07/20/2019  LOS: 13 days   Brief narrative: Louis Ford. is a 73 y.o. 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 On 7/23, patient had extraction of 18 teeth as an outpatient.  Postoperatively, patient had frequent episodes of bleeding leading to dizziness and hypotension and hence presented to the ED 4 days later on 07/20/2019.  In the ED, he was found to be anemic at 8.3 with continuous bleeding.  PRBC transfused.  Patient was admitted under hospitalist service for further evaluation and management. Consultation obtained with dentistry, oral surgery and hematology. On 8/2, patient underwent alveoplasty with re-suturing of oral wound. He is hospitalized not prolonged because of persistent intermittent bleeding even after the suturing.  For last few days, he is waiting for COVID-19 test result to go to a rehab.  Subjective: Patient was seen and examined this morning.  Not in pain.  Continues to hold gauze in his mouth.  Mild soakage with blood noted.  No pain.  No bleeding elsewhere.  Hemoglobin has remained stable between 8-9 for last several days.  Assessment/Plan:  Principal Problem:   Anemia due to blood loss, acute Active Problems:   HTN (hypertension)   Coronary artery disease involving native heart   Automatic implantable cardioverter-defibrillator in situ   Seizure (HCC)   Renal insufficiency   Factor VII deficiency (HCC)   Chronic systolic CHF (congestive heart failure), NYHA class 2 (HCC)   Chronic kidney disease (CKD), stage III (moderate) (HCC)   Oral  bleeding   Coagulopathy (HCC)  Post operative dental/gum bleeding -Started after traction dental extraction on 7/23 -Consult obtained from dentistry and oral surgery. -Tried nonsurgical methods with Amicar 5% rinses and pressure application, but continued to bleed. -On 8/2, underwent alveoplasty with re-suturing of oral wound -Patient continues to hold gauze in his mouth for pressure application. Recommended not chewing on the gauze since that can disrupt his gum wounds as well. He still seems to be having minimal intermittent oozing.  However, hemoglobin has remained stable between 8-9 for last several days. - Encouraged compliance with Amicar use.  Hematology recommends continue Amicar soaks 4 g/10 mL in mouth for 2-3 minutes then spit out. Repeat every 4-6 hours. Not to swish vigorously to  Avoid dislodging clots. - Patient to follow-up with his primary dentist for evaluation of healing and recommendations current concerning use of his immediate dentures.   Acute blood loss anemia  -Hemoglobin dropped to the lowest of 6.7 while in the hospital.  During this hospitalization, patient received 4 units of PRBCs, 2 units of FFP and IV vitamin K..    Factor VII deficiency  -diagnosed in [place] when he had AICD placement with massive bleeding.  -Hematology consult obtained with Dr. Irene Limbo. Per hematology note, he was seen by Dr. Beryle Beams in 2013 but did not followup. He had mild factor VII deficiency, with level in 30-60% range.  -Patient was previously referred to [place] [facility] for evaluation and discussion of treatment options.  Patient apparently did not follow-up with this recommendation.   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  - no indication for rFVIIa or Glidden replacement at this time which is his case is not clearly indicated and would be high risk for thrombosis. -Would avoid systemic use of antifibrinolytics given CF and afib and increased risk of CVA and thrombosis. -primary focus should be on optimizing local interventions by dental and addressing gum disease.  Iron deficiency anemia -Ferritin level low at 28.  Per hematology, target ferritin more than 100.  IV iron infusion given in hospital.  Start on oral iron supplement.  Hypoglycemia. -No history of diabetes mellitus.  Patient has been relying on only clear liquid diet for last several days.  He is scared to advance diet because of the risk of bleeding.  His blood sugar level has fallen down as low as 40s.  I had to start him on dextrose drip.  Diet advanced to soft since last night.  Stop dextrose drip today.  Monitor Accu-Cheks.  Encourage oral intake.  Cardiovascular issues: Hypertension, hyperlipidemia, systolic CHF (EF 89%) s/p ICD, chronic A. Fib -PTA, patient was on amiodarone, Coreg, Imdur, hydralazine, losartan, Lasix, Aldactone. -Continue amiodarone.  Blood pressure persistently on lower side of normal.  So, all other medicines remain on hold.  Hyperkalemia -Potassium level elevated to 5.3 on blood work this morning.  Had trended between 4-5 for last several days. ?  Hemolysis. Aldactone held this morning.  Repeat BMP tomorrow.  History of seizure disorder Valproic acid level initially noted to be low, continue current regimen as tolerated. On oral Depakote, Keppra. Changedphenytoin to p.o.  BPH -Flomax  Mobility: Encourage ambulation. Diet: Patient still on clear liquid diet for several days, reluctant to advance because of risk of bleeding.  Has been tolerating soft diet since last night.   DVT prophylaxis:  SCDs Code Status:   Code Status: Full Code  Family Communication:  Expected Discharge:  Pending COVID-19 test for  placement to rehab.  Consultants:  Dentistry, oral surgery, hematology  Procedures:  8/2 -alveoplasty with re-suturing of oral wound  Antimicrobials: Anti-infectives (From admission, onward)   Start     Dose/Rate Route Frequency Ordered Stop   07/26/19 1602  ceFAZolin (ANCEF) 2-4 GM/100ML-% IVPB    Note to Pharmacy: Darlys Gales   : cabinet override      07/26/19 1602 07/26/19 1626   07/26/19 0845  ceFAZolin (ANCEF) IVPB 2g/100 mL premix     2 g 200 mL/hr over 30 Minutes Intravenous On call to O.R. 07/26/19 0838 07/26/19 1631   07/21/19 0845  amoxicillin (AMOXIL) capsule 500 mg     500 mg Oral Every 8 hours 07/21/19 0837 07/24/19 2203   07/20/19 2200  amoxicillin (AMOXIL) capsule 500 mg  Status:  Discontinued     500 mg Oral 2 times daily 07/20/19 1801 07/21/19 0837      Infusions:    Scheduled Meds: . sodium chloride   Intravenous Once  . amiodarone  200 mg Oral Daily  . divalproex  500 mg Oral Daily  . feeding supplement (ENSURE ENLIVE)  237 mL Oral TID BM  . levETIRAcetam  1,000 mg Oral BID  .  morphine injection  2 mg Intravenous Once  . phenytoin  100 mg Oral BID    PRN meds: acetaminophen, aminocaproic acid, labetalol, morphine injection, ondansetron **OR** ondansetron (ZOFRAN) IV,  oxyCODONE-acetaminophen, polyethylene glycol, sodium chloride flush   Objective: Vitals:   08/03/19 0632 08/03/19 1326  BP: 107/75 94/60  Pulse: 92 74  Resp: 18 (!) 24  Temp: 98.4 F (36.9 C) 98.9 F (37.2 C)  SpO2: 100% 99%    Intake/Output Summary (Last 24 hours) at 08/03/2019 1338 Last data filed at 08/03/2019 1054 Gross per 24 hour  Intake 1462.31 ml  Output 1900 ml  Net -437.69 ml   Filed Weights   07/31/19 0500 08/02/19 0641 08/03/19 0632  Weight: 98 kg 97.7 kg 96.7 kg   Weight change: -1 kg Body mass index is 29.75 kg/m.   Physical Exam: General exam: Appears calm and comfortable.  Skin: No rashes, lesions or ulcers. HEENT: Atraumatic, normocephalic,  supple neck, mild soakage noted in gauze on oral examination Lungs: Clear to auscultation bilaterally CVS: Regular rate and rhythm, no murmur GI/Abd soft, nontender, nondistended, bowel sound present CNS: Alert, awake oriented x3 Psychiatry: Mood appropriate Extremities: No pedal edema, no calf tenderness  Data Review: I have personally reviewed the laboratory data and studies available.  Recent Labs  Lab 07/29/19 0335 07/30/19 0341 07/31/19 0329 08/02/19 0328 08/03/19 0424  WBC 6.6 7.9 7.6 5.9 5.7  NEUTROABS  --   --   --  3.5 3.2  HGB 6.8* 7.7* 8.0* 8.6* 8.7*  HCT 22.8* 25.5* 25.8* 28.4* 28.8*  MCV 97.4 94.1 94.2 93.7 93.5  PLT 245 276 263 304 292   Recent Labs  Lab 07/28/19 0604 07/29/19 0335 07/30/19 0341 08/02/19 0328 08/03/19 0424  NA 136 138 135 136 135  K 4.4 4.5 4.7 4.8 5.3*  CL 106 108 107 108 107  CO2 27 26 26 25 24   GLUCOSE 99 95 91 120* 80  BUN 11 11 9 10 10   CREATININE 1.04 0.99 0.91 1.14 1.04  CALCIUM 7.9* 7.7* 7.7* 7.9* 7.8*  MG  --   --  1.8  --   --     Terrilee Croak, MD  [facility] 08/03/2019

## 2019-08-03 NOTE — Progress Notes (Signed)
Pt sitting up in chair for several hours,tolerated well. Pt changing gauze in mouth.

## 2019-08-03 NOTE — Care Management Important Message (Signed)
Important Message  Patient Details  Name: [facility]. MRN: [id number] Date of Birth: 1946-10-10   Medicare Important Message Given:  Yes. CMA printed out the IM for Case Management Nurse or CSW to give to the patient.      Arshia Rondon 08/03/2019, 11:51 AM

## 2019-08-03 NOTE — Progress Notes (Signed)
Occupational Therapy Treatment Patient Details Name: [name] MRN: [id number] DOB: [birth year] Today's Date: 08/03/2019    History of present illness [name] is a 73 y.o. 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   OT comments  This 73 yo male admitted due to above presents to acute OT with doing better overall with mobility and ADLs, but still with weakness and generalized deconditioning thus continuing to affect his safety and independence with his basic ADLs. He will continue to benefit from acute OT with follow up at SNF.  Follow Up Recommendations  SNF;Supervision/Assistance - 24 hour    Equipment Recommendations  None recommended by OT           Mobility Bed Mobility Overal bed mobility: Needs Assistance Bed Mobility: Supine to Sit     Supine to sit: Supervision;HOB elevated        Transfers Overall transfer level: Needs assistance Equipment used: Rolling walker (2 wheeled) Transfers: Sit to/from Stand Sit to Stand: Min guard              Balance Overall balance assessment: Needs assistance Sitting-balance support: No upper extremity supported;Feet supported Sitting balance-Leahy Scale: Good     Standing balance support: Single extremity supported;During functional activity Standing balance-Leahy Scale: Poor Standing balance comment: standing for back peri care                           ADL either performed or assessed with clinical judgement   ADL Overall ADL's : Needs assistance/impaired                          Toilet Transfer: Minimal assistance;Ambulation;RW;Comfort height toilet;Grab bars   Toileting- Clothing Manipulation and Hygiene: Minimal assistance;Sit to/from stand               Vision Patient Visual Report: No change from baseline            Cognition Arousal/Alertness: Awake/alert Behavior During Therapy: WFL for tasks assessed/performed Overall Cognitive Status: Within Functional Limits for tasks assessed                                                     Pertinent Vitals/ Pain       Pain Assessment: No/denies pain         Frequency  Min 2X/week        Progress Toward Goals  OT Goals(current goals can now be found in the care plan section)  Progress towards OT goals: Progressing toward goals     Plan Discharge plan remains appropriate       AM-PAC OT "6 Clicks" Daily Activity     Outcome Measure   Help from another person eating meals?: None Help from another person taking care of personal grooming?: A Little Help from another person toileting, which includes using toliet, bedpan, or urinal?: A Little Help from another person bathing (including washing, rinsing, drying)?: A  Little Help from another person to put on and taking off regular upper body clothing?: A Little Help from another person to put on and taking off regular lower body clothing?: A Little 6 Click Score: 19    End of Session Equipment Utilized During Treatment: Gait belt;Rolling walker  OT Visit Diagnosis: Unsteadiness on feet (R26.81);Muscle weakness (generalized) (M62.81);History of falling (Z91.81)   Activity Tolerance Patient tolerated treatment well   Patient Left in chair;with call bell/phone within reach;with chair alarm set   Nurse Communication (pt condom cath came off while he was straining to have a bowel movement)        Time: 1505-1530 OT Time Calculation (min): 25 min  Charges: OT Treatments $Self Care/Home Management : 23-37  mins  Louis Ford, OTR/L Acute [employer] Pager [phone] Office [phone]      Louis Ford 08/03/2019, 3:37 PM

## 2019-08-03 NOTE — Progress Notes (Signed)
PT Cancellation Note  Patient Details Name: [facility]. MRN: [id number] DOB: September 05, 1946   Cancelled Treatment:    Reason Eval/Treat Not Completed: Attempted PT tx session-pt declined to participate 2* fatigue from working with OT. Will check back another day.   [name], PT Acute Rehabilitation Services Pager: [phone] Office: [phone]

## 2019-08-04 LAB — PTT FACTOR INHIBITOR (MIXING STUDY)
aPTT 1:1 NP Incub. Mix Ctl: 33 s — ABNORMAL HIGH (ref 22.9–30.2)
aPTT 1:1 NP Mix, 60 Min,Incub.: 35 s — ABNORMAL HIGH (ref 22.9–30.2)
aPTT 1:1 Normal Plasma: 32.4 s — ABNORMAL HIGH (ref 22.9–30.2)
aPTT: 35.8 s — ABNORMAL HIGH (ref 22.9–30.2)

## 2019-08-04 LAB — PT FACTOR INHIBITOR (MIXING STUDY)
1 HR INCUB PT 1:1NP: 11.4 s (ref 9.6–11.5)
PT 1:1NP: 10.9 s (ref 9.6–11.5)
PT: 12.5 s — ABNORMAL HIGH (ref 9.6–11.5)

## 2019-08-04 LAB — SARS CORONAVIRUS 2 (TAT 6-24 HRS): SARS Coronavirus 2: NEGATIVE

## 2019-08-04 LAB — GLUCOSE, CAPILLARY
Glucose-Capillary: 52 mg/dL — ABNORMAL LOW (ref 70–99)
Glucose-Capillary: 64 mg/dL — ABNORMAL LOW (ref 70–99)
Glucose-Capillary: 68 mg/dL — ABNORMAL LOW (ref 70–99)
Glucose-Capillary: 84 mg/dL (ref 70–99)

## 2019-08-04 MED ORDER — DEXTROSE-NACL 5-0.9 % IV SOLN
INTRAVENOUS | Status: DC
  Administered 2019-08-04 – 2019-08-05 (×2): via INTRAVENOUS

## 2019-08-04 NOTE — Progress Notes (Signed)
CRITICAL VALUE ALERT  Critical Value:cbg 50 Date & Time Notied: 8/10 2222 Provider Notified: n  Orders Received/Actions taken:cbg 114 after One amp d50

## 2019-08-04 NOTE — Progress Notes (Signed)
PROGRESS NOTE  [facility]. 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  He presented to the hospital on 7/27 with lightheadedness and dizziness after having tooth removed, bleeding from the mouth and unable to eat properly. Patient is poor historian. According to the patient, he has not been on blood thinners since he had that retroperitoneal bleed about 2 months ago. He had been otherwise doing well. He walks with a walker. Also has a history of seizure and is on multiple medications.   Patient had goneto dentist and had almost all of his teeth removed on 07/08/2019, following that he had continued bleeding into his mouth and they had talked to their dentist who recommended to control with a gauze piece. His wife has been trying to feed him with Gatorade and chicken soup but otherwise he has not been able to eat much. His blood pressure dropped and he started feeling dizzy and presented to the ED.  Since hospitalization, patient has required occasional transfusion as well as given FFP.  Also due to poor oral intake and reluctance to eat, has episodes of hypoglycemia requiring D5 drip.  He is currently waiting for 2- COVID tests so that he can go to skilled nursing.  This morning, patient had another episode of oral bleeding bleeding through several pads.    Assessment/Plan: Principal Problem:   Anemia due to blood loss, acute Active Problems:   HTN (hypertension)   Coronary artery disease involving native heart   Automatic implantable cardioverter-defibrillator in situ   Seizure (HCC)   Renal  insufficiency   Factor VII deficiency (HCC)   Chronic systolic CHF (congestive heart failure), NYHA class 2 (HCC)   Chronic kidney disease (CKD), stage III (moderate) (HCC)   Oral bleeding   Coagulopathy (HCC) Anemia due to acute blood loss, symptomatic anemia: - s/p 1 unit pRBC, hemoglobin was 8 on last check on 8/7. Oral bleeding from recent dental procedure. Had been given FFP this admit with multiple doses of oral amicar. Dr. [name] oral surgery didsuture of oral incisions 8/2.  Hemoglobin 8.7 today.  Is stable.  Continue to monitor hemoglobin.  Transfuse if hgb <7. Patient does have a history of factor VIII deficiency.  Hematology on boardand extensive blood work has been sent..On soft diet  Hypoglycemia.Secondary to poor oral intake/reluctance to eat.  Patient is on full liquid diet and is reluctant to advance to soft diet because of the risk of completing getting worse.  He has had episodes of hypoglycemia.  Blood sugar level was as low as 43 yesterday. I started him on IV dextrose solution yesterday. On hypoglycemia protocol. Encouraged oral intake, on soft diet.   Start on D5 drip at 50 mils per hour.   Dizziness/lightheadedness Likely secondary to blood loss.  Resolved.  Chronic congestive heart failure status post AICD:  -Blood pressure still borderline low so Aldactone, Coreg, nitrates/hydralazine have been on hold.   Chronic atrial fibrillation - Had been rate controlled on amiodarone. Continue amiodarone for rate control.  Hold rest of the medications due to low blood pressure.   History of seizure disorder:  - Valproic acid level initially noted to be low, continue current regimen as tolerated. On oral Depakote,  Keppra. Changedphenytoin to p.o.  Chronic kidney disease stage III due to hypertension: Creatinine at baseline close to 1.  Dementia without behavioral disturbance Stable at this time. Patient's wife makes decisions for him.  She does have HPOA on him.   Debility, deconditioning. Patient has been seen by physical therapy, occupational therapy recommended skilled nursing facility for rehab.  Code Status: Full code  Family Communication: Left message with wife  Disposition Plan: Skilled nursing once second COVID test comes back and is negative   Consultants:  Oral surgery  Procedures:  Transfusion packed red blood cells and FFP during hospitalization  Antimicrobials:  None  DVT prophylaxis: SCDs   Objective: Vitals:   08/03/19 2055 08/04/19 0555  BP: 97/70 93/65  Pulse: (!) 59 98  Resp: 17   Temp: 98.3 F (36.8 C) (!) 97.4 F (36.3 C)  SpO2: 100% 100%    Intake/Output Summary (Last 24 hours) at 08/04/2019 1421 Last data filed at 08/04/2019 0700 Gross per 24 hour  Intake 615.83 ml  Output 1350 ml  Net -734.17 ml   Filed Weights   07/31/19 0500 08/02/19 0641 08/03/19 0632  Weight: 98 kg 97.7 kg 96.7 kg   Body mass index is 29.75 kg/m.  Exam:   General: Alert and oriented x2, no acute distress  HEENT normocephalic, evidence of oral bleeding.  Mucous membranes slightly dry.  Bloody gauze in mouth  Neck: Supple, no JVD  Cardiovascular: Irregular rhythm, rate controlled  Respiratory: Clear to auscultation bilaterally  Abdomen: Soft, nontender, nondistended, positive bowel sounds  Musculoskeletal: No clubbing or cyanosis or edema  Skin: No skin breaks, tears or lesions  Neuro: No focal deficits  Psychiatry: Underlying dementia, but appears to have no evidence of acute psychoses   Data Reviewed: CBC: Recent Labs  Lab 07/29/19 0335 07/30/19 0341 07/31/19 0329 08/02/19 0328 08/03/19 0424  WBC 6.6 7.9 7.6 5.9 5.7  NEUTROABS  --   --   --  3.5 3.2  HGB 6.8* 7.7* 8.0* 8.6* 8.7*  HCT 22.8* 25.5* 25.8* 28.4* 28.8*  MCV 97.4 94.1 94.2 93.7 93.5  PLT 245 276 263 304 007   Basic Metabolic Panel: Recent Labs  Lab 07/29/19 0335 07/30/19 0341 08/02/19 0328  08/03/19 0424  NA 138 135 136 135  K 4.5 4.7 4.8 5.3*  CL 108 107 108 107  CO2 26 26 25 24   GLUCOSE 95 91 120* 80  BUN 11 9 10 10   CREATININE 0.99 0.91 1.14 1.04  CALCIUM 7.7* 7.7* 7.9* 7.8*  MG  --  1.8  --   --    GFR: Estimated Creatinine Clearance: 78.4 mL/min (by C-G formula based on SCr of 1.04 mg/dL). Liver Function Tests: No results for input(s): AST, ALT, ALKPHOS, BILITOT, PROT, ALBUMIN in the last 168 hours. No results for input(s): LIPASE, AMYLASE in the last 168 hours. No results for input(s): AMMONIA in the last 168 hours. Coagulation Profile: Recent Labs  Lab 07/29/19 2113  INR 1.3*   Cardiac Enzymes: No results for input(s): CKTOTAL, CKMB, CKMBINDEX, TROPONINI in the last 168 hours. BNP (last 3 results) Recent Labs    11/26/18 1025  PROBNP 56.0   HbA1C: No results for input(s): HGBA1C in the last 72 hours. CBG: Recent Labs  Lab 08/03/19 1824 08/03/19 2218 08/03/19 2304 08/04/19 0755 08/04/19 1150  GLUCAP 70 50* 114* 52* 64*   Lipid Profile: No results for input(s): CHOL, HDL, LDLCALC, TRIG, CHOLHDL, LDLDIRECT in the last 72 hours. Thyroid Function Tests: No  results for input(s): TSH, T4TOTAL, FREET4, T3FREE, THYROIDAB in the last 72 hours. Anemia Panel: No results for input(s): VITAMINB12, FOLATE, FERRITIN, TIBC, IRON, RETICCTPCT in the last 72 hours. Urine analysis:    Component Value Date/Time   COLORURINE YELLOW 04/23/2019 1915   APPEARANCEUR CLOUDY (A) 04/23/2019 1915   LABSPEC 1.017 04/23/2019 1915   PHURINE 6.0 04/23/2019 1915   GLUCOSEU NEGATIVE 04/23/2019 1915   HGBUR SMALL (A) 04/23/2019 1915   BILIRUBINUR NEGATIVE 04/23/2019 1915   BILIRUBINUR Neg 11/27/2017 1032   KETONESUR NEGATIVE 04/23/2019 1915   PROTEINUR 30 (A) 04/23/2019 1915   UROBILINOGEN 0.2 11/27/2017 1032   UROBILINOGEN 1.0 10/13/2013 1855   NITRITE NEGATIVE 04/23/2019 1915   LEUKOCYTESUR LARGE (A) 04/23/2019 1915   Sepsis Labs:  @LABRCNTIP (procalcitonin:4,lacticidven:4)  )No results found for this or any previous visit (from the past 240 hour(s)).    Studies: No results found.  Scheduled Meds: . sodium chloride   Intravenous Once  . amiodarone  200 mg Oral Daily  . divalproex  500 mg Oral Daily  . feeding supplement (ENSURE ENLIVE)  237 mL Oral TID BM  . levETIRAcetam  1,000 mg Oral BID  .  morphine injection  2 mg Intravenous Once  . phenytoin  100 mg Oral BID  . phytonadione  5 mg Oral Daily    Continuous Infusions:   LOS: 14 days     Annita Brod, MD [facility]  To reach me or the doctor on call, go to: [email or web address] Password [email or web address]  08/04/2019, 2:21 PM

## 2019-08-05 DIAGNOSIS — Z515 Encounter for palliative care: Secondary | ICD-10-CM

## 2019-08-05 LAB — BASIC METABOLIC PANEL
Anion gap: 4 — ABNORMAL LOW (ref 5–15)
BUN: 13 mg/dL (ref 8–23)
CO2: 25 mmol/L (ref 22–32)
Calcium: 7.9 mg/dL — ABNORMAL LOW (ref 8.9–10.3)
Chloride: 107 mmol/L (ref 98–111)
Creatinine, Ser: 1.2 mg/dL (ref 0.61–1.24)
GFR calc Af Amer: >60 mL/min (ref >60)
GFR calc non Af Amer: >60 mL/min (ref >60)
Glucose, Bld: 110 mg/dL — ABNORMAL HIGH (ref 70–99)
Potassium: 5.1 mmol/L (ref 3.5–5.1)
Sodium: 136 mmol/L (ref 135–145)

## 2019-08-05 LAB — CBC
HCT: 28.4 % — ABNORMAL LOW (ref 39.0–52.0)
Hemoglobin: 8.7 g/dL — ABNORMAL LOW (ref 13.0–17.0)
MCH: 28.7 pg (ref 26.0–34.0)
MCHC: 30.6 g/dL (ref 30.0–36.0)
MCV: 93.7 fL (ref 80.0–100.0)
Platelets: 286 10*3/uL (ref 150–400)
RBC: 3.03 MIL/uL — ABNORMAL LOW (ref 4.22–5.81)
RDW: 15.3 % (ref 11.5–15.5)
WBC: 5.1 10*3/uL (ref 4.0–10.5)
nRBC: 0 % (ref 0.0–0.2)

## 2019-08-05 LAB — PTT-LA MIX: PTT-LA Mix: 55.4 s — ABNORMAL HIGH (ref 0.0–48.9)

## 2019-08-05 LAB — ANTIPHOSPHOLIPID SYNDROME EVAL, BLD
Anticardiolipin IgA: <9 APL U/mL (ref 0–11)
Anticardiolipin IgG: <9 GPL U/mL (ref 0–14)
Anticardiolipin IgM: <9 MPL U/mL (ref 0–12)
DRVVT: 41 s (ref 0.0–47.0)
PTT Lupus Anticoagulant: 64.2 s — ABNORMAL HIGH (ref 0.0–51.9)
Phosphatydalserine, IgA: 2 APS IgA (ref 0–20)
Phosphatydalserine, IgG: 3 GPS IgG (ref 0–11)
Phosphatydalserine, IgM: 2 MPS IgM (ref 0–25)

## 2019-08-05 LAB — GLUCOSE, CAPILLARY
Glucose-Capillary: 65 mg/dL — ABNORMAL LOW (ref 70–99)
Glucose-Capillary: 83 mg/dL (ref 70–99)
Glucose-Capillary: 50 mg/dL — ABNORMAL LOW (ref 70–99)
Glucose-Capillary: 82 mg/dL (ref 70–99)
Glucose-Capillary: 79 mg/dL (ref 70–99)

## 2019-08-05 LAB — HEXAGONAL PHASE PHOSPHOLIPID: Hexagonal Phase Phospholipid: 0 s (ref 0–11)

## 2019-08-05 MED ORDER — ENSURE ENLIVE PO LIQD: 237.0000 mL | Freq: Three times a day (TID) | ORAL | 12 refills | Status: DC

## 2019-08-05 MED ORDER — TRAMADOL HCL 50 MG PO TABS: 50.0000 mg | ORAL_TABLET | Freq: Four times a day (QID) | ORAL | 0 refills | Status: DC | PRN

## 2019-08-05 MED ORDER — AMINOCAPROIC ACID SOLUTION 5% (50 MG/ML): 10.0000 mL | ORAL | Status: DC | PRN

## 2019-08-05 NOTE — Progress Notes (Signed)
PROGRESS NOTE  [facility] 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  He presented to the hospital on 7/27 with lightheadedness and dizziness after having tooth removed, bleeding from the mouth and unable to eat properly. Patient is poor historian. According to the patient, he has not been on blood thinners since he had that retroperitoneal bleed about 2 months ago. He had been otherwise doing well. He walks with a walker. Also has a history of seizure and is on multiple medications.   Patient had goneto dentist and had almost all of his teeth removed on 07/08/2019, following that he had continued bleeding into his mouth and they had talked to their dentist who recommended to control with a gauze piece. His wife has been trying to feed him with Gatorade and chicken soup but otherwise he has not been able to eat much. His blood pressure dropped and he started feeling dizzy and presented to the ED.  Since hospitalization, patient has required occasional transfusion as well as given FFP.  Also due to poor oral intake and reluctance to eat, has episodes of hypoglycemia requiring D5 drip.  On morning of 8/11, patient had another episode of oral bleeding bleeding through several pads, although hemoglobin has remained stable.  Patient second COVID test also negative.  He has no complaints although his blood sugars are still borderline despite D5 drip    Assessment/Plan: Principal Problem:   Anemia due to blood loss, acute Active Problems:   HTN (hypertension)   Coronary artery disease involving native heart  Automatic implantable cardioverter-defibrillator in situ   Seizure (HCC)   Renal insufficiency   Factor VII deficiency (HCC)   Chronic systolic CHF (congestive heart failure), NYHA class 2 (HCC)   Chronic kidney disease (CKD), stage III (moderate) (HCC)   Oral bleeding   Coagulopathy ([place])   Hospice care patient   Palliative care patient Anemia due to acute blood loss, symptomatic anemia: - s/p 1 unit pRBC, hemoglobin was 8 on last check on 8/7. Oral bleeding from recent dental procedure. Had been given FFP this admit with multiple doses of oral amicar. Dr. [name] oral surgery didsuture of oral incisions 8/2.  Hemoglobin 8.7 today.  Is stable.  Continue to monitor hemoglobin.  Transfuse if hgb <7. Patient does have a history of factor VIII deficiency.  Hematology on boardand extensive blood work has been sent..On soft diet.  Continue Amicar  Hypoglycemia.Secondary to poor oral intake/reluctance to eat.  Patient is on full liquid diet and is reluctant to advance to soft diet because of the risk of completing getting worse.  He has had episodes of hypoglycemia.  Blood sugar level was as low as 43 yesterday. I started him on IV dextrose solution yesterday. On hypoglycemia protocol. Encouraged oral intake, on soft diet.   We will stop dextrose drip and monitor CBGs   Dizziness/lightheadedness Likely secondary to blood loss.  Resolved.  Chronic congestive heart failure status post AICD:  -Blood pressure still borderline low so Aldactone, Coreg, nitrates/hydralazine have been on hold.  These can likely be started as an outpatient slowly as blood pressure trends upward  Chronic atrial fibrillation - Had been rate controlled on amiodarone. Continue amiodarone for rate  control.  Hold rest of the medications due to low blood pressure.   History of seizure disorder:  - Valproic acid level initially noted to be low, continue current regimen as tolerated. On oral  Depakote, Keppra. Changedphenytoin to p.o.  Chronic kidney disease stage III due to hypertension: Creatinine at baseline close to 1.  Dementia without behavioral disturbance Stable at this time. Patient's wife makes decisions for him. She does have HPOA on him.   Debility, deconditioning. Patient has been seen by physical therapy, occupational therapy recommended skilled nursing facility for rehab.  Code Status: Full code  Family Communication: Left message with wife  Disposition Plan: Skilled nursing hopefully tomorrow if CBGs remained stable despite D5 drip turned off   Consultants:  Oral surgery  Procedures:  Transfusion packed red blood cells and FFP during hospitalization  Antimicrobials:  None  DVT prophylaxis: SCDs   Objective: Vitals:   08/04/19 2125 08/05/19 0628  BP: 95/71 114/81  Pulse: 99 99  Resp: 20 16  Temp: 98.6 F (37 C) 98.2 F (36.8 C)  SpO2: 100% 98%    Intake/Output Summary (Last 24 hours) at 08/05/2019 1315 Last data filed at 08/05/2019 0839 Gross per 24 hour  Intake 2053.83 ml  Output 3475 ml  Net -1421.17 ml   Filed Weights   07/31/19 0500 08/02/19 0641 08/03/19 0632  Weight: 98 kg 97.7 kg 96.7 kg   Body mass index is 29.75 kg/m.  Exam:   General: Alert and oriented x2, no acute distress  HEENT normocephalic, atraumatic.  Mucous membranes slightly dry.   Neck: Supple, no JVD  Cardiovascular: Irregular rhythm, rate controlled  Respiratory: Clear to auscultation bilaterally  Abdomen: Soft, nontender, nondistended, positive bowel sounds  Musculoskeletal: No clubbing or cyanosis or edema  Skin: No skin breaks, tears or lesions  Neuro: No focal deficits  Psychiatry: Underlying dementia, but appears to have no evidence of acute psychoses   Data Reviewed: CBC: Recent Labs  Lab 07/30/19 0341 07/31/19 0329 08/02/19 0328 08/03/19 0424 08/05/19 0853  WBC 7.9 7.6 5.9 5.7 5.1  NEUTROABS  --   --  3.5 3.2  --    HGB 7.7* 8.0* 8.6* 8.7* 8.7*  HCT 25.5* 25.8* 28.4* 28.8* 28.4*  MCV 94.1 94.2 93.7 93.5 93.7  PLT 276 263 304 292 160   Basic Metabolic Panel: Recent Labs  Lab 07/30/19 0341 08/02/19 0328 08/03/19 0424 08/05/19 0549  NA 135 136 135 136  K 4.7 4.8 5.3* 5.1  CL 107 108 107 107  CO2 26 25 24 25   GLUCOSE 91 120* 80 110*  BUN 9 10 10 13   CREATININE 0.91 1.14 1.04 1.20  CALCIUM 7.7* 7.9* 7.8* 7.9*  MG 1.8  --   --   --    GFR: Estimated Creatinine Clearance: 68 mL/min (by C-G formula based on SCr of 1.2 mg/dL). Liver Function Tests: No results for input(s): AST, ALT, ALKPHOS, BILITOT, PROT, ALBUMIN in the last 168 hours. No results for input(s): LIPASE, AMYLASE in the last 168 hours. No results for input(s): AMMONIA in the last 168 hours. Coagulation Profile: Recent Labs  Lab 07/29/19 2113  INR 1.3*   Cardiac Enzymes: No results for input(s): CKTOTAL, CKMB, CKMBINDEX, TROPONINI in the last 168 hours. BNP (last 3 results) Recent Labs    11/26/18 1025  PROBNP 56.0   HbA1C: No results for input(s): HGBA1C in the last 72 hours. CBG: Recent Labs  Lab 08/04/19 1150 08/04/19 1548 08/04/19 2123 08/05/19 0735 08/05/19 1233  GLUCAP 64* 68* 84 65* 83   Lipid Profile: No results for input(s): CHOL, HDL, LDLCALC, TRIG, CHOLHDL, LDLDIRECT in the last 72 hours. Thyroid Function Tests: No results for input(s): TSH, T4TOTAL, FREET4, T3FREE, THYROIDAB in the last 72 hours. Anemia Panel: No results for input(s): VITAMINB12, FOLATE, FERRITIN, TIBC, IRON, RETICCTPCT in the last 72 hours. Urine analysis:    Component Value Date/Time   COLORURINE YELLOW 04/23/2019 1915   APPEARANCEUR CLOUDY (A) 04/23/2019 1915   LABSPEC 1.017 04/23/2019 1915   PHURINE 6.0 04/23/2019 1915   GLUCOSEU NEGATIVE 04/23/2019 1915   HGBUR SMALL (A) 04/23/2019 1915   BILIRUBINUR NEGATIVE 04/23/2019 1915   BILIRUBINUR Neg 11/27/2017 1032   KETONESUR NEGATIVE 04/23/2019 1915   PROTEINUR 30 (A)  04/23/2019 1915   UROBILINOGEN 0.2 11/27/2017 1032   UROBILINOGEN 1.0 10/13/2013 1855   NITRITE NEGATIVE 04/23/2019 1915   LEUKOCYTESUR LARGE (A) 04/23/2019 1915   Sepsis Labs: @LABRCNTIP (procalcitonin:4,lacticidven:4)  ) Recent Results (from the past 240 hour(s))  SARS CORONAVIRUS 2 Nasal Swab Aptima Multi Swab     Status: None   Collection Time: 08/04/19  9:52 AM   Specimen: Aptima Multi Swab; Nasal Swab  Result Value Ref Range Status   SARS Coronavirus 2 NEGATIVE NEGATIVE Final    Comment: (NOTE) SARS-CoV-2 target nucleic acids are NOT DETECTED. The SARS-CoV-2 RNA is generally detectable in upper and lower respiratory specimens during the acute phase of infection. Negative results do not preclude SARS-CoV-2 infection, do not rule out co-infections with other pathogens, and should not be used as the sole basis for treatment or other patient management decisions. Negative results must be combined with clinical observations, patient history, and epidemiological information. The expected result is Negative. Fact Sheet for Patients: SugarRoll.be Fact Sheet for Healthcare Providers: https://www.woods-mathews.com/ This test is not yet approved or cleared by the [place] FDA and  has been authorized for detection and/or diagnosis of SARS-CoV-2 by FDA under an Emergency Use Authorization (EUA). This EUA will remain  in effect (meaning this test can be used) for the duration of the COVID-19 declaration under Section 56 4(b)(1) of the Act, 21 U.S.C. section 360bbb-3(b)(1), unless the authorization is terminated or revoked sooner. Performed at [facility], [place] [address] [place], [place] [address]       Studies: No results found.  Scheduled Meds: . sodium chloride   Intravenous Once  . amiodarone  200 mg Oral Daily  . divalproex  500 mg Oral Daily  . feeding supplement (ENSURE ENLIVE)  237 mL Oral TID BM  . levETIRAcetam   1,000 mg Oral BID  .  morphine injection  2 mg Intravenous Once  . phenytoin  100 mg Oral BID  . phytonadione  5 mg Oral Daily    Continuous Infusions:   LOS: 15 days     Annita Brod, MD [facility]  To reach me or the doctor on call, go to: [email or web address] Password [email or web address]  08/05/2019, 1:15 PM

## 2019-08-06 LAB — GLUCOSE, CAPILLARY
Glucose-Capillary: 33 mg/dL — CL (ref 70–99)
Glucose-Capillary: 57 mg/dL — ABNORMAL LOW (ref 70–99)
Glucose-Capillary: 147 mg/dL — ABNORMAL HIGH (ref 70–99)
Glucose-Capillary: 91 mg/dL (ref 70–99)
Glucose-Capillary: 39 mg/dL — CL (ref 70–99)
Glucose-Capillary: 26 mg/dL — CL (ref 70–99)
Glucose-Capillary: 81 mg/dL (ref 70–99)
Glucose-Capillary: 54 mg/dL — ABNORMAL LOW (ref 70–99)
Glucose-Capillary: 86 mg/dL (ref 70–99)

## 2019-08-06 LAB — BASIC METABOLIC PANEL
Anion gap: 8 (ref 5–15)
BUN: 10 mg/dL (ref 8–23)
CO2: 18 mmol/L — ABNORMAL LOW (ref 22–32)
Calcium: 7.8 mg/dL — ABNORMAL LOW (ref 8.9–10.3)
Chloride: 111 mmol/L (ref 98–111)
Creatinine, Ser: 1.14 mg/dL (ref 0.61–1.24)
GFR calc Af Amer: >60 mL/min (ref >60)
GFR calc non Af Amer: >60 mL/min (ref >60)
Glucose, Bld: 123 mg/dL — ABNORMAL HIGH (ref 70–99)
Potassium: 5.5 mmol/L — ABNORMAL HIGH (ref 3.5–5.1)
Sodium: 137 mmol/L (ref 135–145)

## 2019-08-06 LAB — CBC
HCT: 28.2 % — ABNORMAL LOW (ref 39.0–52.0)
Hemoglobin: 8.2 g/dL — ABNORMAL LOW (ref 13.0–17.0)
MCH: 28.1 pg (ref 26.0–34.0)
MCHC: 29.1 g/dL — ABNORMAL LOW (ref 30.0–36.0)
MCV: 96.6 fL (ref 80.0–100.0)
Platelets: 223 10*3/uL (ref 150–400)
RBC: 2.92 MIL/uL — ABNORMAL LOW (ref 4.22–5.81)
RDW: 15.5 % (ref 11.5–15.5)
WBC: 4.4 10*3/uL (ref 4.0–10.5)
nRBC: 0 % (ref 0.0–0.2)

## 2019-08-06 LAB — HEPATIC FUNCTION PANEL
ALT: 14 U/L (ref 0–44)
AST: 31 U/L (ref 15–41)
Albumin: 3 g/dL — ABNORMAL LOW (ref 3.5–5.0)
Alkaline Phosphatase: 81 U/L (ref 38–126)
Bilirubin, Direct: 0.2 mg/dL (ref 0.0–0.2)
Indirect Bilirubin: 0.4 mg/dL (ref 0.3–0.9)
Total Bilirubin: 0.6 mg/dL (ref 0.3–1.2)
Total Protein: 6.9 g/dL (ref 6.5–8.1)

## 2019-08-06 LAB — CORTISOL-AM, BLOOD: Cortisol - AM: 10.4 ug/dL (ref 6.7–22.6)

## 2019-08-06 LAB — TSH: TSH: 5.695 u[IU]/mL — ABNORMAL HIGH (ref 0.350–4.500)

## 2019-08-06 MED ORDER — SODIUM CHLORIDE 0.9 % IV BOLUS: 500.0000 mL | Freq: Once | INTRAVENOUS | Status: DC

## 2019-08-06 MED ORDER — VITAMIN B-12 100 MCG PO TABS
100.0000 ug | ORAL_TABLET | Freq: Every day | ORAL | Status: DC
  Administered 2019-08-06 – 2019-08-21 (×15): 100 ug via ORAL
  Filled 2019-08-06 (×16): qty 1

## 2019-08-06 MED ORDER — FERROUS SULFATE 325 (65 FE) MG PO TABS
325.0000 mg | ORAL_TABLET | Freq: Two times a day (BID) | ORAL | Status: DC
  Administered 2019-08-06 – 2019-08-21 (×29): 325 mg via ORAL
  Filled 2019-08-06 (×30): qty 1

## 2019-08-06 MED ORDER — SENNOSIDES-DOCUSATE SODIUM 8.6-50 MG PO TABS
1.0000 | ORAL_TABLET | Freq: Two times a day (BID) | ORAL | Status: DC
  Administered 2019-08-06 – 2019-08-21 (×29): 1 via ORAL
  Filled 2019-08-06 (×30): qty 1

## 2019-08-06 MED ORDER — DEXTROSE-NACL 5-0.9 % IV SOLN
INTRAVENOUS | Status: DC
  Administered 2019-08-06 – 2019-08-07 (×2): via INTRAVENOUS

## 2019-08-06 MED ORDER — DEXTROSE 50 % IV SOLN
INTRAVENOUS | Status: AC
  Administered 2019-08-06: 22:00:00 25 g via INTRAVENOUS
  Filled 2019-08-06: qty 50

## 2019-08-06 MED ORDER — BISACODYL 5 MG PO TBEC: 5.0000 mg | DELAYED_RELEASE_TABLET | Freq: Every day | ORAL | Status: DC | PRN

## 2019-08-06 MED ORDER — SODIUM ZIRCONIUM CYCLOSILICATE 10 G PO PACK
10.0000 g | PACK | Freq: Once | ORAL | Status: AC
  Administered 2019-08-06: 10 g via ORAL
  Filled 2019-08-06: qty 1

## 2019-08-06 MED ORDER — DEXTROSE 50 % IV SOLN
25.0000 g | INTRAVENOUS | Status: AC
  Administered 2019-08-06: 22:00:00 25 g via INTRAVENOUS

## 2019-08-06 MED ORDER — DEXTROSE 50 % IV SOLN
INTRAVENOUS | Status: AC
  Administered 2019-08-06: 18:00:00
  Filled 2019-08-06: qty 50

## 2019-08-06 MED ORDER — DEXTROSE 50 % IV SOLN
INTRAVENOUS | Status: AC
  Administered 2019-08-06: 09:00:00 50 mL
  Filled 2019-08-06: qty 50

## 2019-08-06 NOTE — Progress Notes (Addendum)
Hypoglycemic Event  CBG: 33  Treatment: 15 g carbohydrate   Symptoms: None  Follow-up CBG: Time:0806 CBG Result: 57  Possible Reasons for Event: Inadequate meal intake  Comments/MD notified: Tyrell Antonio, MD    Lolita Rieger

## 2019-08-06 NOTE — Progress Notes (Signed)
Occupational Therapy Treatment Patient Details Name: [facility]. MRN: [id number] DOB: 09-10-1946 Today's Date: 08/06/2019    History of present illness [facility]. is a 73 y.o. 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   OT comments  Pt slowly progressing toward OT goals, however, still limited by pain. Pt required max encouragement to engage in session, agreeable to UE exercises with theraband. Pt reports feeling down due to continuous bleeding of the mouth. Pt will benefit from continued acute OT to address strength and safety in ADLs. DC and freq remains the same OT will continue to follow acutely.     Follow Up Recommendations  SNF;Supervision/Assistance - 24 hour    Equipment Recommendations  None recommended by OT    Recommendations for Other Services      Precautions / Restrictions Precautions Precautions: Fall Precaution Comments: monitor HR Restrictions Weight Bearing Restrictions: No       Mobility Bed Mobility Overal bed mobility: Needs Assistance             General bed mobility comments: pt received sitting in chair  Transfers Overall transfer level: Needs assistance Equipment used: Rolling walker (2 wheeled)             General transfer comment: pt received sitting in chair, reports feeling pain all over and required max encouragement to engage in session.    Balance Overall balance assessment: Needs assistance Sitting-balance support: No upper extremity supported;Feet supported Sitting balance-Leahy Scale: Good     Standing balance  support: Single extremity supported;During functional activity Standing balance-Leahy Scale: Poor Standing balance comment: standing for back peri care                           ADL either performed or assessed with clinical judgement   ADL Overall ADL's : Needs assistance/impaired                                       General ADL Comments: Session limited to UE exercise to improve strength for safe ADL engagement.      Vision       Perception     Praxis      Cognition Arousal/Alertness: Awake/alert Behavior During Therapy: WFL for tasks assessed/performed Overall Cognitive Status: Within Functional Limits for tasks assessed                                 General Comments: AxO x 3 pleasant        Exercises Exercises: General Upper Extremity General Exercises - Upper Extremity Shoulder Flexion: Strengthening;Both;10 reps;Seated;Theraband Theraband Level (Shoulder Flexion): Level 2 (Red) Shoulder Extension: Strengthening;Both;10 reps;Theraband;Seated Theraband Level (Shoulder Extension): Level 2 (Red)   Shoulder Instructions       General Comments      Pertinent Vitals/ Pain       Pain Assessment: 0-10 Pain Score: 8  Pain Location: entire body Pain Descriptors / Indicators: Aching Pain Intervention(s): Limited activity within patient's tolerance;Monitored during  session;Repositioned  Home Living                                          Prior Functioning/Environment              Frequency  Min 2X/week        Progress Toward Goals  OT Goals(current goals can now be found in the care plan section)  Progress towards OT goals: Progressing toward goals(limited by pain and discouragement)  Acute Rehab OT Goals Patient Stated Goal: agreeable to get OOB OT Goal Formulation: With patient Time For Goal Achievement: 08/13/19 Potential to Achieve Goals: Good ADL Goals Pt Will Perform Grooming:  (d/c) Pt Will Perform Upper Body Bathing: (d/c) Pt Will Perform Lower Body Bathing: (d/c) Pt Will Perform Upper Body Dressing: (d/c) Pt Will Perform Lower Body Dressing: (d/c) Pt Will Transfer to Toilet: with modified independence;ambulating;bedside commode Pt Will Perform Toileting - Clothing Manipulation and hygiene: with modified independence Additional ADL Goal #1: pt will be mod I with adl including clothing retrieval Additional ADL Goal #2: pt will initiate a rest break without cues for energy conservation  Plan Discharge plan remains appropriate    Co-evaluation                 AM-PAC OT "6 Clicks" Daily Activity     Outcome Measure   Help from another person eating meals?: None Help from another person taking care of personal grooming?: A Little Help from another person toileting, which includes using toliet, bedpan, or urinal?: A Little Help from another person bathing (including washing, rinsing, drying)?: A Little Help from another person to put on and taking off regular upper body clothing?: A Little Help from another person to put on and taking off regular lower body clothing?: A Little 6 Click Score: 19    End of Session    OT Visit Diagnosis: Unsteadiness on feet (R26.81);Muscle weakness (generalized) (M62.81);History of falling (Z91.81)   Activity Tolerance Patient limited by pain;Patient limited by fatigue   Patient Left in chair;with call bell/phone within reach;with chair alarm set   Nurse Communication Mobility status        Time: 0955-1010 OT Time Calculation (min): 15 min  Charges: OT General Charges $OT Visit: 1 Visit OT Treatments $Self Care/Home Management : 8-22 mins  Louis Ford, MSOT, OTR/L  Supplemental Rehabilitation Services  [phone]    Marius Ditch 08/06/2019, 10:36 AM

## 2019-08-06 NOTE — Progress Notes (Signed)
PT Cancellation Note  Patient Details Name: Louis Ford. MRN: [id number] DOB: 26-Jul-1946   Cancelled Treatment:    Reason Eval/Treat Not Completed: Patient declined, no reason specified - pt eating dinner, defers PT until tomorrow.  Julien Girt, PT Acute Rehabilitation Services Pager [phone]  Office [phone]    [place] 08/06/2019, 6:12 PM

## 2019-08-06 NOTE — Consult Note (Signed)
Reason for Consult: oral bleeding Referring Physician: Niel Hummer, MD  [facility]. is an 73 y.o. male   CC: Recurrent oral  bleeding   HPI:  73 y.o.malewho underwent dental extractions 07/16/2019 by general dentist in [place]. Patient has persistent post-op bleeding. Presented to ER 07/20/2019 and admitted. Has had Amicar 5% oral rinses, PRBC, FFP transfusions.Patient underwent surgical re-closure of incisions 07/27/2019 and was doing well until he had episodes of bleeding on 08/04/2019.   Past Medical History:  Diagnosis Date  . AICD (automatic cardioverter/defibrillator) present   . Atrial fibrillation   09/22/2012  . Blood loss anemia 04/18/2017   After GI bleed from colonoscopy and polypectomy  . CAD (coronary artery disease)   . Chronic systolic heart failure ([place])   . 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 GIB (gastrointestinal bleeding) 02/20/2017  . History of MRSA infection 05/2011  . HTN (hypertension)   . Hx of adenomatous colonic polyps 02/20/2017   01/2017 - 3 cm sigmoid TV adenoma and other smaller polyps - had post-polypectomy bleed Tx w/ clips Consider repeat colonoscopy 3 yrs [name], MD, [name]   . Hyperlipidemia   . implantable cardiac defibrillator-Biotronik    Device Implanted 2006; s/p gen change 03/2011 : bleeding persistent with pocket erosion and infection; explant and reimplant  06/2011  . Ischemic cardiomyopathy    EF 15 to 20% by TTE and TEE in 09/2012.  Severe LV dysfunction  . Obesity    BMI 31 in 09/2012  . Persistent atrial fibrillation with rapid ventricular response 04/21/2014  . Retroperitoneal bleed 04/21/2019  . Seizure disorder ([place]) latest 09/30/2012  . Seizures ([place])   . Stroke [facility])     Past Surgical History:  Procedure Laterality Date  . ALVEOLOPLASTY N/A 07/26/2019   Procedure: ALVEOLOPLASTY WITH RESUTURING OF ORAL WOUND;  Surgeon: [name], [name];  Location: [facility] ORS;  Service:  Oral Surgery;  Laterality: N/A;  . CARDIAC CATHETERIZATION N/A 10/12/2015   Procedure: Right Heart Cath;  Surgeon: [name], MD;  Location: [facility] LAB;  Service: Cardiovascular;  Laterality: N/A;  . CARDIOVERSION  09/24/2012   Procedure: CARDIOVERSION;  Surgeon: [name], MD;  Location: [facility] ENDOSCOPY;  Service: Cardiovascular;  Laterality: N/A;  . CARDIOVERSION N/A 12/25/2017   Procedure: CARDIOVERSION;  Surgeon: [name], MD;  Location: [facility] ORS;  Service: Cardiovascular;  Laterality: N/A;  . COLONOSCOPY W/ POLYPECTOMY  02/13/2017  . CORONARY ARTERY BYPASS GRAFT  2011   in [place]  . FLEXIBLE SIGMOIDOSCOPY N/A 02/20/2017   Procedure: FLEXIBLE SIGMOIDOSCOPY;  Surgeon: [name], MD;  Location: [facility] ENDOSCOPY;  Service: Endoscopy;  Laterality: N/A;  . FLEXIBLE SIGMOIDOSCOPY N/A 02/21/2017   Procedure: FLEXIBLE SIGMOIDOSCOPY;  Surgeon: [name], MD;  Location: [facility] ENDOSCOPY;  Service: Endoscopy;  Laterality: N/A;  . ICD    . TEE WITHOUT CARDIOVERSION  09/24/2012   Procedure: TRANSESOPHAGEAL ECHOCARDIOGRAM (TEE);  Surgeon: [name], MD;  Location: [facility] ENDOSCOPY;  Service: Cardiovascular;  Laterality: N/A;  dave/anesth, dl, cindy/echo     Family History  Problem Relation Age of Onset  . Arthritis Mother   . Heart disease Mother   . Heart attack Mother   . Other Father        smoker  . Hypertension Neg Hx        unknown  . Stroke Neg Hx        unknown  Social History:  reports that he quit smoking about 10 years ago. His smoking use included pipe. He quit smokeless tobacco use about 10 years ago. He reports that he does not drink alcohol or use drugs.  Allergies: No Known Allergies  Medications: I have reviewed the patient's current medications.  Results for orders placed or performed during the hospital encounter of 07/20/19 (from the past 48 hour(s))  Glucose, capillary     Status: Abnormal   Collection Time: 08/04/19  3:48 PM  Result Value Ref  Range   Glucose-Capillary 68 (L) 70 - 99 mg/dL  Glucose, capillary     Status: None   Collection Time: 08/04/19  9:23 PM  Result Value Ref Range   Glucose-Capillary 84 70 - 99 mg/dL  Basic metabolic panel     Status: Abnormal   Collection Time: 08/05/19  5:49 AM  Result Value Ref Range   Sodium 136 135 - 145 mmol/L   Potassium 5.1 3.5 - 5.1 mmol/L   Chloride 107 98 - 111 mmol/L   CO2 25 22 - 32 mmol/L   Glucose, Bld 110 (H) 70 - 99 mg/dL   BUN 13 8 - 23 mg/dL   Creatinine, Ser 1.20 0.61 - 1.24 mg/dL   Calcium 7.9 (L) 8.9 - 10.3 mg/dL   GFR calc non Af Amer >60 >60 mL/min   GFR calc Af Amer >60 >60 mL/min   Anion gap 4 (L) 5 - 15    Comment: Performed at [facility], [place] [address] [place], [place] [address]  Glucose, capillary     Status: Abnormal   Collection Time: 08/05/19  7:35 AM  Result Value Ref Range   Glucose-Capillary 65 (L) 70 - 99 mg/dL  CBC     Status: Abnormal   Collection Time: 08/05/19  8:53 AM  Result Value Ref Range   WBC 5.1 4.0 - 10.5 K/uL   RBC 3.03 (L) 4.22 - 5.81 MIL/uL   Hemoglobin 8.7 (L) 13.0 - 17.0 g/dL   HCT 28.4 (L) 39.0 - 52.0 %   MCV 93.7 80.0 - 100.0 fL   MCH 28.7 26.0 - 34.0 pg   MCHC 30.6 30.0 - 36.0 g/dL   RDW 15.3 11.5 - 15.5 %   Platelets 286 150 - 400 K/uL   nRBC 0.0 0.0 - 0.2 %    Comment: Performed at [facility], [place] [address] [place], [place] [address]  Glucose, capillary     Status: None   Collection Time: 08/05/19 12:33 PM  Result Value Ref Range   Glucose-Capillary 83 70 - 99 mg/dL  Glucose, capillary     Status: Abnormal   Collection Time: 08/05/19  4:46 PM  Result Value Ref Range   Glucose-Capillary 50 (L) 70 - 99 mg/dL  Glucose, capillary     Status: None   Collection Time: 08/05/19  6:49 PM  Result Value Ref Range   Glucose-Capillary 82 70 - 99 mg/dL  Glucose, capillary     Status: None   Collection Time: 08/05/19  8:20 PM  Result Value Ref Range   Glucose-Capillary 79 70 -  99 mg/dL  Glucose, capillary     Status: Abnormal   Collection Time: 08/06/19  7:48 AM  Result Value Ref Range   Glucose-Capillary 33 (LL) 70 - 99 mg/dL   Comment 1 Notify RN    Comment 2 Document in Chart   Glucose, capillary     Status: Abnormal   Collection Time: 08/06/19  8:29 AM  Result Value Ref Range   Glucose-Capillary 57 (L) 70 - 99 mg/dL   Comment 1 Notify RN    Comment 2 Document in Chart   Glucose, capillary     Status: Abnormal   Collection Time: 08/06/19  9:34 AM  Result Value Ref Range   Glucose-Capillary 147 (H) 70 - 99 mg/dL   Comment 1 Notify RN    Comment 2 Document in Chart   Basic metabolic panel     Status: Abnormal   Collection Time: 08/06/19  9:56 AM  Result Value Ref Range   Sodium 137 135 - 145 mmol/L   Potassium 5.5 (H) 3.5 - 5.1 mmol/L    Comment: SLIGHT HEMOLYSIS   Chloride 111 98 - 111 mmol/L   CO2 18 (L) 22 - 32 mmol/L   Glucose, Bld 123 (H) 70 - 99 mg/dL   BUN 10 8 - 23 mg/dL   Creatinine, Ser 1.14 0.61 - 1.24 mg/dL   Calcium 7.8 (L) 8.9 - 10.3 mg/dL   GFR calc non Af Amer >60 >60 mL/min   GFR calc Af Amer >60 >60 mL/min   Anion gap 8 5 - 15    Comment: Performed at [facility], [place] [address] [place], [place] [address]  CBC     Status: Abnormal   Collection Time: 08/06/19  9:56 AM  Result Value Ref Range   WBC 4.4 4.0 - 10.5 K/uL   RBC 2.92 (L) 4.22 - 5.81 MIL/uL   Hemoglobin 8.2 (L) 13.0 - 17.0 g/dL   HCT 28.2 (L) 39.0 - 52.0 %   MCV 96.6 80.0 - 100.0 fL   MCH 28.1 26.0 - 34.0 pg   MCHC 29.1 (L) 30.0 - 36.0 g/dL   RDW 15.5 11.5 - 15.5 %   Platelets 223 150 - 400 K/uL   nRBC 0.0 0.0 - 0.2 %    Comment: Performed at [facility], [place] [address] [place], [place] [address]  Hepatic function panel     Status: Abnormal   Collection Time: 08/06/19  9:56 AM  Result Value Ref Range   Total Protein 6.9 6.5 - 8.1 g/dL   Albumin 3.0 (L) 3.5 - 5.0 g/dL   AST 31 15 - 41 U/L   ALT 14 0 - 44 U/L    Alkaline Phosphatase 81 38 - 126 U/L   Total Bilirubin 0.6 0.3 - 1.2 mg/dL   Bilirubin, Direct 0.2 0.0 - 0.2 mg/dL   Indirect Bilirubin 0.4 0.3 - 0.9 mg/dL    Comment: Performed at [facility], [place] [address] [place], [place] [address]  Cortisol-am, blood     Status: None   Collection Time: 08/06/19  9:56 AM  Result Value Ref Range   Cortisol - AM 10.4 6.7 - 22.6 ug/dL    Comment: Performed at [facility], [place] [address], [place], [place] [address]  TSH     Status: Abnormal   Collection Time: 08/06/19  9:56 AM  Result Value Ref Range   TSH 5.695 (H) 0.350 - 4.500 uIU/mL    Comment: Performed by a 3rd Generation assay with a functional sensitivity of <=0.01 uIU/mL. Performed at [facility], [place] [address], [place], [place] [address]   Glucose, capillary     Status: None   Collection Time: 08/06/19 11:48 AM  Result Value Ref Range   Glucose-Capillary 91 70 - 99 mg/dL   Comment 1 Notify RN  Comment 2 Document in Chart     No results found.  Blood pressure 114/67, pulse 80, temperature 97.8 F (36.6 C), temperature source Axillary, resp. rate 17, height 5' 10.98" (1.803 m), weight 95.8 kg, SpO2 (!) 83 %. General appearance: alert, cooperative and no distress Head: Normocephalic, without obvious abnormality, atraumatic Eyes: negative Nose: Nares normal. Septum midline. Mucosa normal. No drainage or sinus tenderness. Throat: edentulous maxilla hemostatic. Mandible edentulous with liver clots at #22, 27 and 29 positions. Slight oozing. Pharynx clear.  Neck: no adenopathy  Assessment/Plan: Persistent intermittent bleeding from oral surgery procedure although Hb has remained stable. In light of minimal bleeding and stable Hb, recommend continue Vitamin K, gauze with pressure, ice to jaws, soft diet, Amicar rinse.     [name] 08/06/2019, 3:47 PM

## 2019-08-06 NOTE — Progress Notes (Addendum)
Hypoglycemic Event  CBG: 57  Treatment: D50 50 mL (25 gm)  Symptoms: None  Follow-up CBG: Time: CBG Result: 147  Possible Reasons for Event: Inadequate meal intake  Comments/MD notified:    Lolita Rieger

## 2019-08-06 NOTE — Progress Notes (Signed)
Hypoglycemic Event  CBG: 39  Treatment: 8 oz juice/soda  Symptoms: None  Follow-up CBG: Time: CBG Result:  Possible Reasons for Event: Inadequate meal intake  Comments/MD notified:    Lolita Rieger

## 2019-08-06 NOTE — Progress Notes (Signed)
PROGRESS NOTE    [facility].  MGQ:[id number] DOB: [birth year] DOA: 07/20/2019 PCP: [name], MD   Brief Narrative: 73 year old with past medical history significant for cardiovascular disease, chronic systolic heart failure ejection fraction 25%, status post AICD, chronic A. 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 Patient presented to the hospital on 7/27 with lightheadedness and dizziness after having tooth removed, bleeding from the mouth and unable to eat properly.  Per the patient he has not been on any blood thinner since he had retroperitoneal bleed about 2 months prior to admission.  Patient is able to walk with a walker.  Also has a history of seizure and is on multiple medications. Patient had gone to the dentist and had almost all his teeth removed on 07/08/2019, following that he had continued bleeding into his mouth and he had talk to their dentist who recommended to control with gauze piece.  Patient has had poor oral intake.   Since hospitalization, patient has required occasional transfusion as well as FFP.  Also due to poor oral intake, has episode of hypoglycemia requiring D5 drip.  The morning 8/11 patient had another episode of oral bleeding through several pads, although hemoglobin has remained stable.  Patient's second COVID test was also negative.  Assessment & Plan:   Principal Problem:   Anemia due to blood loss, acute Active Problems:   HTN (hypertension)   Coronary artery disease involving native heart   Automatic implantable cardioverter-defibrillator in situ   Seizure (HCC)   Renal insufficiency   Factor VII deficiency (HCC)   Chronic systolic CHF (congestive heart failure), NYHA class 2 (HCC)   Chronic kidney disease (CKD), stage III (moderate) (HCC)   Oral bleeding   Coagulopathy ([place])   Hospice care patient   Palliative care patient  1-Anemia due to acute blood loss, symptomatic  anemia: post operative dental bleeding.  Patient has required blood transfusion during this admission due to dropping hemoglobin. She has also required FFP and vitamin K. Dr. [name] oral surgeon did suture of incision on 8/2 His hemoglobin today is 8.2. She has a history of factor VIII deficiency, hematology was consulted.  Work-up has been sent out. Need vitamin K for 10 to 14 days. Per hematology is some concern for possible C PVC of liver and possible related coagulopathy is recommending vitamin K. Patient also received Amicar Dr [name] to follow on patient.   Hypoglycemia: Secondary to poor oral intake. Back on D5 IV fluids. Cortisol level normal at 10.  TSH mildly elevated. I have encouraged patient to drink Ensure.  Constipation:  No bowel movement in 4 days Schedule Senokot. Dulcolax PRN  Mild hyperkalemia: Lokelma ordered. Repeat labs in am.    Dizziness lightheadedness: Likely related to blood loss.  Chronic congestive heart failure status post AICD. Due to soft blood pressure, Aldactone, Coreg, nitrates and hydralazine has been on hold.  Chronic A. fib: Continue with amiodarone.  History of seizure disorder: Continue with Depakote, Keppra , phenytoin.  Dementia without behavioral disturbance: Stable  Deconditioning: He needs  PT rehab.   Nutrition Problem: Inadequate oral intake Etiology: acute illness, mouth pain    Signs/Symptoms: per patient/family report    Interventions: Ensure Enlive (each supplement provides 350kcal and 20 grams of protein), MVI  Estimated body mass index is 29.47 kg/m as calculated from the following:   Height as of this encounter: 5' 10.98" (1.803 m).   Weight as of this encounter:  95.8 kg.   DVT prophylaxis: scd  Code Status: full code Family Communication: wife over phone Disposition Plan: SNF when CBG stable.  Consultants:   Dr [name]  Hematology   Procedures:   8/2 -alveoplasty with re-suturing of oral wound    Antimicrobials:  none  Subjective: He report bleeding 8-11 He report changing gauze 4 to 5 time bloody.  He will drink ensure No BM in last 4 days.  He hasn't seen oral surgeon in a while.   Objective: Vitals:   08/05/19 0628 08/05/19 1508 08/05/19 2017 08/06/19 0432  BP: 114/81 105/83 92/67 106/69  Pulse: 99 84 77 88  Resp: 16 16 13 15   Temp: 98.2 F (36.8 C) 98.4 F (36.9 C) 98.3 F (36.8 C) 98.5 F (36.9 C)  TempSrc: Axillary Axillary Axillary Oral  SpO2: 98% 96% 98% 99%  Weight:    95.8 kg  Height:        Intake/Output Summary (Last 24 hours) at 08/06/2019 1343 Last data filed at 08/06/2019 1341 Gross per 24 hour  Intake 1972.86 ml  Output 2200 ml  Net -227.14 ml   Filed Weights   08/02/19 0641 08/03/19 0632 08/06/19 0432  Weight: 97.7 kg 96.7 kg 95.8 kg    Examination:  General exam: Appears calm and comfortable , bloody gauze in mouth  Respiratory system: Clear to auscultation. Respiratory effort normal. Cardiovascular system: S1 & S2 heard, RRR. No JVD, murmurs, rubs, gallops or clicks. No pedal edema. Gastrointestinal system: Abdomen is nondistended, soft and nontender. No organomegaly or masses felt. Normal bowel sounds heard. Central nervous system: Alert and oriented. No focal neurological deficits. Extremities: Symmetric 5 x 5 power. Skin: No rashes, lesions or ulcers Psychiatry: Judgement and insight appear normal. Mood & affect appropriate.     Data Reviewed: I have personally reviewed following labs and imaging studies  CBC: Recent Labs  Lab 07/31/19 0329 08/02/19 0328 08/03/19 0424 08/05/19 0853 08/06/19 0956  WBC 7.6 5.9 5.7 5.1 4.4  NEUTROABS  --  3.5 3.2  --   --   HGB 8.0* 8.6* 8.7* 8.7* 8.2*  HCT 25.8* 28.4* 28.8* 28.4* 28.2*  MCV 94.2 93.7 93.5 93.7 96.6  PLT 263 304 292 286 539   Basic Metabolic Panel: Recent Labs  Lab 08/02/19 0328 08/03/19 0424 08/05/19 0549 08/06/19 0956  NA 136 135 136 137  K 4.8 5.3* 5.1 5.5*   CL 108 107 107 111  CO2 25 24 25  18*  GLUCOSE 120* 80 110* 123*  BUN 10 10 13 10   CREATININE 1.14 1.04 1.20 1.14  CALCIUM 7.9* 7.8* 7.9* 7.8*   GFR: Estimated Creatinine Clearance: 71.2 mL/min (by C-G formula based on SCr of 1.14 mg/dL). Liver Function Tests: Recent Labs  Lab 08/06/19 0956  AST 31  ALT 14  ALKPHOS 81  BILITOT 0.6  PROT 6.9  ALBUMIN 3.0*   No results for input(s): LIPASE, AMYLASE in the last 168 hours. No results for input(s): AMMONIA in the last 168 hours. Coagulation Profile: No results for input(s): INR, PROTIME in the last 168 hours. Cardiac Enzymes: No results for input(s): CKTOTAL, CKMB, CKMBINDEX, TROPONINI in the last 168 hours. BNP (last 3 results) Recent Labs    11/26/18 1025  PROBNP 56.0   HbA1C: No results for input(s): HGBA1C in the last 72 hours. CBG: Recent Labs  Lab 08/05/19 2020 08/06/19 0748 08/06/19 0829 08/06/19 0934 08/06/19 1148  GLUCAP 79 33* 57* 147* 91   Lipid Profile: No results for input(s):  CHOL, HDL, LDLCALC, TRIG, CHOLHDL, LDLDIRECT in the last 72 hours. Thyroid Function Tests: Recent Labs    08/06/19 0956  TSH 5.695*   Anemia Panel: No results for input(s): VITAMINB12, FOLATE, FERRITIN, TIBC, IRON, RETICCTPCT in the last 72 hours. Sepsis Labs: No results for input(s): PROCALCITON, LATICACIDVEN in the last 168 hours.  Recent Results (from the past 240 hour(s))  SARS CORONAVIRUS 2 Nasal Swab Aptima Multi Swab     Status: None   Collection Time: 08/04/19  9:52 AM   Specimen: Aptima Multi Swab; Nasal Swab  Result Value Ref Range Status   SARS Coronavirus 2 NEGATIVE NEGATIVE Final    Comment: (NOTE) SARS-CoV-2 target nucleic acids are NOT DETECTED. The SARS-CoV-2 RNA is generally detectable in upper and lower respiratory specimens during the acute phase of infection. Negative results do not preclude SARS-CoV-2 infection, do not rule out co-infections with other pathogens, and should not be used as the  sole basis for treatment or other patient management decisions. Negative results must be combined with clinical observations, patient history, and epidemiological information. The expected result is Negative. Fact Sheet for Patients: SugarRoll.be Fact Sheet for Healthcare Providers: https://www.woods-mathews.com/ This test is not yet approved or cleared by the [place] FDA and  has been authorized for detection and/or diagnosis of SARS-CoV-2 by FDA under an Emergency Use Authorization (EUA). This EUA will remain  in effect (meaning this test can be used) for the duration of the COVID-19 declaration under Section 56 4(b)(1) of the Act, 21 U.S.C. section 360bbb-3(b)(1), unless the authorization is terminated or revoked sooner. Performed at [facility], [place] [address], [place], [place] [address]          Radiology Studies: No results found.      Scheduled Meds: . sodium chloride   Intravenous Once  . amiodarone  200 mg Oral Daily  . dextrose  25 g Intravenous STAT  . divalproex  500 mg Oral Daily  . feeding supplement (ENSURE ENLIVE)  237 mL Oral TID BM  . ferrous sulfate  325 mg Oral BID WC  . levETIRAcetam  1,000 mg Oral BID  .  morphine injection  2 mg Intravenous Once  . phenytoin  100 mg Oral BID  . phytonadione  5 mg Oral Daily  . senna-docusate  1 tablet Oral BID  . sodium zirconium cyclosilicate  10 g Oral Once  . vitamin B-12  100 mcg Oral Daily   Continuous Infusions: . dextrose 5 % and 0.9% NaCl 75 mL/hr at 08/06/19 0805  . sodium chloride       LOS: 16 days    Time spent: 35 minutes.     Elmarie Shiley, MD [facility] Pager [phone]  If 7PM-7AM, please contact night-coverage [email or web address] Password [place] 08/06/2019, 1:43 PM

## 2019-08-06 NOTE — Progress Notes (Signed)
Confirmed with chosen SNF [facility]) that pt has insurance authorization and needed covid test result (from 8/11)  to admit today if medically stable. Will assist with transition there when ready.  Sharren Bridge, MSW, LCSW Transitions of Care 08/06/2019 [phone]

## 2019-08-07 ENCOUNTER — Inpatient Hospital Stay: Payer: Self-pay

## 2019-08-07 ENCOUNTER — Ambulatory Visit: Payer: Self-pay | Admitting: Neurology

## 2019-08-07 LAB — GLUCOSE, CAPILLARY
Glucose-Capillary: 26 mg/dL — CL (ref 70–99)
Glucose-Capillary: 62 mg/dL — ABNORMAL LOW (ref 70–99)
Glucose-Capillary: 69 mg/dL — ABNORMAL LOW (ref 70–99)
Glucose-Capillary: 52 mg/dL — ABNORMAL LOW (ref 70–99)
Glucose-Capillary: 50 mg/dL — ABNORMAL LOW (ref 70–99)
Glucose-Capillary: 16 mg/dL — CL (ref 70–99)
Glucose-Capillary: 45 mg/dL — ABNORMAL LOW (ref 70–99)
Glucose-Capillary: 58 mg/dL — ABNORMAL LOW (ref 70–99)
Glucose-Capillary: 78 mg/dL (ref 70–99)
Glucose-Capillary: 82 mg/dL (ref 70–99)

## 2019-08-07 LAB — BASIC METABOLIC PANEL
Anion gap: 5 (ref 5–15)
BUN: 12 mg/dL (ref 8–23)
CO2: 22 mmol/L (ref 22–32)
Calcium: 7.7 mg/dL — ABNORMAL LOW (ref 8.9–10.3)
Chloride: 108 mmol/L (ref 98–111)
Creatinine, Ser: 1.01 mg/dL (ref 0.61–1.24)
GFR calc Af Amer: >60 mL/min (ref >60)
GFR calc non Af Amer: >60 mL/min (ref >60)
Glucose, Bld: 79 mg/dL (ref 70–99)
Potassium: 5.5 mmol/L — ABNORMAL HIGH (ref 3.5–5.1)
Sodium: 135 mmol/L (ref 135–145)

## 2019-08-07 LAB — CBC WITH DIFFERENTIAL/PLATELET
Abs Immature Granulocytes: 0.02 10*3/uL (ref 0.00–0.07)
Basophils Absolute: 0 10*3/uL (ref 0.0–0.1)
Basophils Relative: 1 %
Eosinophils Absolute: 0.4 10*3/uL (ref 0.0–0.5)
Eosinophils Relative: 7 %
HCT: 31.3 % — ABNORMAL LOW (ref 39.0–52.0)
Hemoglobin: 9.2 g/dL — ABNORMAL LOW (ref 13.0–17.0)
Immature Granulocytes: 0 %
Lymphocytes Relative: 18 %
Lymphs Abs: 1.1 10*3/uL (ref 0.7–4.0)
MCH: 27.6 pg (ref 26.0–34.0)
MCHC: 29.4 g/dL — ABNORMAL LOW (ref 30.0–36.0)
MCV: 94 fL (ref 80.0–100.0)
Monocytes Absolute: 0.7 10*3/uL (ref 0.1–1.0)
Monocytes Relative: 13 %
Neutro Abs: 3.7 10*3/uL (ref 1.7–7.7)
Neutrophils Relative %: 61 %
Platelets: 270 10*3/uL (ref 150–400)
RBC: 3.33 MIL/uL — ABNORMAL LOW (ref 4.22–5.81)
RDW: 15.4 % (ref 11.5–15.5)
WBC: 5.9 10*3/uL (ref 4.0–10.5)
nRBC: 0 % (ref 0.0–0.2)

## 2019-08-07 LAB — CBC
HCT: 27.1 % — ABNORMAL LOW (ref 39.0–52.0)
Hemoglobin: 7.8 g/dL — ABNORMAL LOW (ref 13.0–17.0)
MCH: 27.5 pg (ref 26.0–34.0)
MCHC: 28.8 g/dL — ABNORMAL LOW (ref 30.0–36.0)
MCV: 95.4 fL (ref 80.0–100.0)
Platelets: 262 10*3/uL (ref 150–400)
RBC: 2.84 MIL/uL — ABNORMAL LOW (ref 4.22–5.81)
RDW: 15.4 % (ref 11.5–15.5)
WBC: 4.7 10*3/uL (ref 4.0–10.5)
nRBC: 0 % (ref 0.0–0.2)

## 2019-08-07 LAB — GLUCOSE, RANDOM
Glucose, Bld: 60 mg/dL — ABNORMAL LOW (ref 70–99)
Glucose, Bld: 95 mg/dL (ref 70–99)

## 2019-08-07 LAB — PREPARE RBC (CROSSMATCH)

## 2019-08-07 MED ORDER — SODIUM CHLORIDE 0.9% IV SOLUTION
Freq: Once | INTRAVENOUS | Status: AC
  Administered 2019-08-07: 19:00:00 via INTRAVENOUS

## 2019-08-07 MED ORDER — CEFAZOLIN SODIUM-DEXTROSE 2-4 GM/100ML-% IV SOLN
2.0000 g | INTRAVENOUS | Status: AC
  Administered 2019-08-08: 2 g via INTRAVENOUS

## 2019-08-07 MED ORDER — GLUCAGON HCL RDNA (DIAGNOSTIC) 1 MG IJ SOLR
1.0000 mg | Freq: Once | INTRAMUSCULAR | Status: AC | PRN
  Administered 2019-08-07: 12:00:00 1 mg via INTRAVENOUS
  Filled 2019-08-07 (×2): qty 1

## 2019-08-07 MED ORDER — COSYNTROPIN 0.25 MG IJ SOLR
0.2500 mg | Freq: Once | INTRAMUSCULAR | Status: AC
  Administered 2019-08-08: 06:00:00 0.25 mg via INTRAVENOUS
  Filled 2019-08-07: qty 0.25

## 2019-08-07 MED ORDER — DEXTROSE 10 % IV SOLN
INTRAVENOUS | Status: DC
  Administered 2019-08-07: 09:00:00 via INTRAVENOUS
  Filled 2019-08-07 (×2): qty 1000

## 2019-08-07 MED ORDER — SODIUM CHLORIDE 0.9% FLUSH
10.0000 mL | INTRAVENOUS | Status: DC | PRN
  Administered 2019-08-18 – 2019-08-21 (×4): 10 mL
  Filled 2019-08-07 (×4): qty 40

## 2019-08-07 MED ORDER — FUROSEMIDE 10 MG/ML IJ SOLN
20.0000 mg | Freq: Once | INTRAMUSCULAR | Status: AC
  Administered 2019-08-07: 20 mg via INTRAVENOUS
  Filled 2019-08-07: qty 2

## 2019-08-07 MED ORDER — SODIUM ZIRCONIUM CYCLOSILICATE 10 G PO PACK
10.0000 g | PACK | Freq: Once | ORAL | Status: AC
  Administered 2019-08-07: 10 g via ORAL
  Filled 2019-08-07: qty 1

## 2019-08-07 MED ORDER — DEXTROSE 50 % IV SOLN
25.0000 g | INTRAVENOUS | Status: AC
  Administered 2019-08-07: 25 g via INTRAVENOUS

## 2019-08-07 MED ORDER — SODIUM CHLORIDE 0.9% FLUSH
10.0000 mL | Freq: Two times a day (BID) | INTRAVENOUS | Status: DC
  Administered 2019-08-07 – 2019-08-21 (×15): 10 mL

## 2019-08-07 MED ORDER — CHLORHEXIDINE GLUCONATE CLOTH 2 % EX PADS
6.0000 | MEDICATED_PAD | Freq: Every day | CUTANEOUS | Status: DC
  Administered 2019-08-08 – 2019-08-13 (×5): 6 via TOPICAL

## 2019-08-07 MED ORDER — DEXTROSE 50 % IV SOLN
INTRAVENOUS | Status: AC
  Filled 2019-08-07: qty 50

## 2019-08-07 MED ORDER — SODIUM POLYSTYRENE SULFONATE 15 GM/60ML PO SUSP
30.0000 g | Freq: Once | ORAL | Status: DC
  Filled 2019-08-07: qty 120

## 2019-08-07 MED ORDER — SODIUM CHLORIDE 0.9% IV SOLUTION: Freq: Once | INTRAVENOUS | Status: DC

## 2019-08-07 MED ORDER — SODIUM ZIRCONIUM CYCLOSILICATE 10 G PO PACK
10.0000 g | PACK | Freq: Once | ORAL | Status: DC
  Filled 2019-08-07: qty 1

## 2019-08-07 NOTE — TOC Transition Note (Signed)
Transition of Care [facility]) - CM/SW Discharge Note   Patient Details  Name: [facility] LLC. MRN: [id number] Date of Birth: 31-Mar-1946  Transition of [facility]) CM/SW Contact:  Lynnell Catalan, RN Phone Number: 08/07/2019, 12:17 PM   Clinical Narrative:     To [facility] when medically ready for dc.      Readmission Risk Interventions Readmission Risk Prevention Plan 08/07/2019  Transportation Screening Complete  PCP or Specialist Appt within 3-5 Days Complete  HRI or [place] Not Complete  HRI or Home Care Consult comments To SNF  Social Work Consult for [place] Planning/Counseling Not Complete  SW consult not completed comments NA  Palliative Care Screening Not Applicable  Medication Review [employer]) Complete  Some recent data might be hidden

## 2019-08-07 NOTE — Progress Notes (Signed)
Peripherally Inserted Central Catheter/Midline Placement  The IV Nurse has discussed with the patient and/or persons authorized to consent for the patient, the purpose of this procedure and the potential benefits and risks involved with this procedure.  The benefits include less needle sticks, lab draws from the catheter, and the patient may be discharged home with the catheter. Risks include, but not limited to, infection, bleeding, blood clot (thrombus formation), and puncture of an artery; nerve damage and irregular heartbeat and possibility to perform a PICC exchange if needed/ordered by physician.  Alternatives to this procedure were also discussed.  Bard Power PICC patient education guide, fact sheet on infection prevention and patient information card has been provided to patient /or left at bedside.    PICC/Midline Placement Documentation  PICC Double Lumen 08/07/19 PICC Left Brachial 52 cm 0 cm (Active)  Indication for Insertion or Continuance of Line Poor Vasculature-patient has had multiple peripheral attempts or PIVs lasting less than 24 hours 08/07/19 1700  Exposed Catheter (cm) 0 cm 08/07/19 1700  Site Assessment Clean;Dry;Intact 08/07/19 1700  Lumen #1 Status Flushed;Blood return noted 08/07/19 1700  Lumen #2 Status Flushed;Blood return noted 08/07/19 1700  Dressing Type Transparent 08/07/19 1700  Dressing Status Clean;Dry;Intact;Antimicrobial disc in place;Other (Comment) 08/07/19 1700  Dressing Change Due 08/14/19 08/07/19 1700       Jule Economy Horton 08/07/2019, 5:47 PM

## 2019-08-07 NOTE — Progress Notes (Signed)
PROGRESS NOTE    [facility].  RKY:[id number] DOB: 03/02/1946 DOA: 07/20/2019 PCP: [name], MD   Brief Narrative: 73 year old with past medical history significant for cardiovascular disease, chronic systolic heart failure ejection fraction 25%, status post AICD, chronic A. 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 Patient presented to the hospital on 7/27 with lightheadedness and dizziness after having tooth removed, bleeding from the mouth and unable to eat properly.  Per the patient he has not been on any blood thinner since he had retroperitoneal bleed about 2 months prior to admission.  Patient is able to walk with a walker.  Also has a history of seizure and is on multiple medications. Patient had gone to the dentist and had almost all his teeth removed on 07/08/2019, following that he had continued bleeding into his mouth and he had talk to their dentist who recommended to control with gauze piece.  Patient has had poor oral intake.   Since hospitalization, patient has required occasional transfusion as well as FFP.  Also due to poor oral intake, has episode of hypoglycemia requiring D5 drip.  The morning 8/11 patient had another episode of oral bleeding through several pads, although hemoglobin has remained stable.  Patient's second COVID test was also negative.  Assessment & Plan:   Principal Problem:   Anemia due to blood loss, acute Active Problems:   HTN (hypertension)   Coronary artery disease involving native heart   Automatic implantable cardioverter-defibrillator in situ   Seizure (HCC)   Renal insufficiency   Factor VII deficiency (HCC)   Chronic systolic CHF (congestive heart failure), NYHA class 2 (HCC)   Chronic kidney disease (CKD), stage III (moderate) (HCC)   Oral bleeding   Coagulopathy ([place])   Hospice care patient   Palliative care patient  1-Anemia due to acute blood loss, symptomatic  anemia: post operative dental bleeding.  Patient has required blood transfusion during this admission due to dropping hemoglobin. She has also required FFP and vitamin K. Dr. [name] oral surgeon did suture of incision on 8/2 His hemoglobin today is 8.2. She has a history of factor VIII deficiency, hematology was consulted.  Work-up has been sent out. Need vitamin K for 10 to 14 days. Per hematology is some concern for possible C PVC of liver and possible related coagulopathy is recommending vitamin K. Patient also received [place] Dr [name] evaluated patient 8/13 patient might need further intervention if continue to bleed.  Hb today 7.8. will transfuse one unit. Bleeding stable today.   Hypoglycemia: Secondary to poor oral intake ? . Cortisol level normal at 10.  TSH mildly elevated. I have encouraged patient to drink Ensure. CBG continue to be low, on D IV fluids, he ate breakfast and drinking ensure and cbg still 50 on D 10.  Will get c peptide, insulin level, ACTH consytropin test. Will consult endocrinologist.  I have order Glucagon.  cbg was 16 and patient is alert, conversant, we are thinking finger stick is not accurate. Trying to get venous sample.   Constipation:  No bowel movement in 4 days Schedule Senokot. Dulcolax PRN  Mild hyperkalemia: Repeat lokelma  Dizziness lightheadedness: Likely related to blood loss.  Chronic congestive heart failure status post AICD. Due to soft blood pressure, Aldactone, Coreg, nitrates and hydralazine has been on hold.  Chronic A. fib: Continue with amiodarone.  History of seizure disorder: Continue with Depakote, Keppra , phenytoin.  Dementia without behavioral disturbance: Stable  Deconditioning: He needs  PT rehab.   Nutrition Problem: Inadequate oral intake Etiology: acute illness, mouth pain    Signs/Symptoms: per patient/family report    Interventions: Ensure Enlive (each supplement provides 350kcal and 20 grams of  protein), MVI  Estimated body mass index is 29.47 kg/m as calculated from the following:   Height as of this encounter: 5' 10.98" (1.803 m).   Weight as of this encounter: 95.8 kg.   DVT prophylaxis: scd  Code Status: full code Family Communication: wife over phone Disposition Plan: SNF when CBG stable.  Consultants:   Dr [name]  Hematology   Procedures:   8/2 -alveoplasty with re-suturing of oral wound   Antimicrobials:  none  Subjective: He is frustrated, he is tired. He wants to stop everything. His wife will speak with him.  cbg low but he is alert.  He is eating more.  Bleeding from gum same.   Objective: Vitals:   08/06/19 1401 08/06/19 2120 08/07/19 0628 08/07/19 0633  BP: 114/67 97/71 95/71    Pulse: 80 (!) 110 (!) 111 (!) 110  Resp: 17 16 20    Temp: 97.8 F (36.6 C) 98.3 F (36.8 C) 97.7 F (36.5 C)   TempSrc: Axillary Oral Oral   SpO2: (!) 83% 100% 100%   Weight:      Height:        Intake/Output Summary (Last 24 hours) at 08/07/2019 0957 Last data filed at 08/07/2019 0900 Gross per 24 hour  Intake 2263.39 ml  Output 1781 ml  Net 482.39 ml   Filed Weights   08/02/19 0641 08/03/19 0632 08/06/19 0432  Weight: 97.7 kg 96.7 kg 95.8 kg    Examination:  General exam: NAD, bloody gauze mouth Respiratory system: CTA Cardiovascular system: S 1, S 2 RRR Gastrointestinal system: BS present, soft, nt Central nervous system: alert, non focal.  Extremities; symmetric power.  Skin: no rashes.    Data Reviewed: I have personally reviewed following labs and imaging studies  CBC: Recent Labs  Lab 08/02/19 0328 08/03/19 0424 08/05/19 0853 08/06/19 0956 08/07/19 0546  WBC 5.9 5.7 5.1 4.4 4.7  NEUTROABS 3.5 3.2  --   --   --   HGB 8.6* 8.7* 8.7* 8.2* 7.8*  HCT 28.4* 28.8* 28.4* 28.2* 27.1*  MCV 93.7 93.5 93.7 96.6 95.4  PLT 304 292 286 223 801   Basic Metabolic Panel: Recent Labs  Lab 08/02/19 0328 08/03/19 0424 08/05/19 0549 08/06/19  0956 08/07/19 0546  NA 136 135 136 137 135  K 4.8 5.3* 5.1 5.5* 5.5*  CL 108 107 107 111 108  CO2 25 24 25  18* 22  GLUCOSE 120* 80 110* 123* 79  BUN 10 10 13 10 12   CREATININE 1.14 1.04 1.20 1.14 1.01  CALCIUM 7.9* 7.8* 7.9* 7.8* 7.7*   GFR: Estimated Creatinine Clearance: 80.4 mL/min (by C-G formula based on SCr of 1.01 mg/dL). Liver Function Tests: Recent Labs  Lab 08/06/19 0956  AST 31  ALT 14  ALKPHOS 81  BILITOT 0.6  PROT 6.9  ALBUMIN 3.0*   No results for input(s): LIPASE, AMYLASE in the last 168 hours. No results for input(s): AMMONIA in the last 168 hours. Coagulation Profile: No results for input(s): INR, PROTIME in the last 168 hours. Cardiac Enzymes: No results for input(s): CKTOTAL, CKMB, CKMBINDEX, TROPONINI in the last 168 hours. BNP (last 3 results) Recent Labs    11/26/18 1025  PROBNP 56.0   HbA1C: No results for input(s): HGBA1C in the last  72 hours. CBG: Recent Labs  Lab 08/06/19 2213 08/07/19 0736 08/07/19 0808 08/07/19 0849 08/07/19 0930  GLUCAP 86 26* 62* 69* 52*   Lipid Profile: No results for input(s): CHOL, HDL, LDLCALC, TRIG, CHOLHDL, LDLDIRECT in the last 72 hours. Thyroid Function Tests: Recent Labs    08/06/19 0956  TSH 5.695*   Anemia Panel: No results for input(s): VITAMINB12, FOLATE, FERRITIN, TIBC, IRON, RETICCTPCT in the last 72 hours. Sepsis Labs: No results for input(s): PROCALCITON, LATICACIDVEN in the last 168 hours.  Recent Results (from the past 240 hour(s))  SARS CORONAVIRUS 2 Nasal Swab Aptima Multi Swab     Status: None   Collection Time: 08/04/19  9:52 AM   Specimen: Aptima Multi Swab; Nasal Swab  Result Value Ref Range Status   SARS Coronavirus 2 NEGATIVE NEGATIVE Final    Comment: (NOTE) SARS-CoV-2 target nucleic acids are NOT DETECTED. The SARS-CoV-2 RNA is generally detectable in upper and lower respiratory specimens during the acute phase of infection. Negative results do not preclude SARS-CoV-2  infection, do not rule out co-infections with other pathogens, and should not be used as the sole basis for treatment or other patient management decisions. Negative results must be combined with clinical observations, patient history, and epidemiological information. The expected result is Negative. Fact Sheet for Patients: SugarRoll.be Fact Sheet for Healthcare Providers: https://www.woods-mathews.com/ This test is not yet approved or cleared by the [place] FDA and  has been authorized for detection and/or diagnosis of SARS-CoV-2 by FDA under an Emergency Use Authorization (EUA). This EUA will remain  in effect (meaning this test can be used) for the duration of the COVID-19 declaration under Section 56 4(b)(1) of the Act, 21 U.S.C. section 360bbb-3(b)(1), unless the authorization is terminated or revoked sooner. Performed at [facility], [place] [address] [place], [place] [address]          Radiology Studies: [place] Ekg Site Rite  Result Date: 08/07/2019 If [facility] image not attached, placement could not be confirmed due to current cardiac rhythm.  [place] Ekg Site Rite  Result Date: 08/07/2019 If Site Rite image not attached, placement could not be confirmed due to current cardiac rhythm.       Scheduled Meds: . sodium chloride   Intravenous Once  . sodium chloride   Intravenous Once  . amiodarone  200 mg Oral Daily  . [START ON 08/08/2019] cosyntropin  0.25 mg Intravenous Once  . divalproex  500 mg Oral Daily  . feeding supplement (ENSURE ENLIVE)  237 mL Oral TID BM  . ferrous sulfate  325 mg Oral BID WC  . furosemide  20 mg Intravenous Once  . levETIRAcetam  1,000 mg Oral BID  .  morphine injection  2 mg Intravenous Once  . phenytoin  100 mg Oral BID  . phytonadione  5 mg Oral Daily  . senna-docusate  1 tablet Oral BID  . sodium zirconium cyclosilicate  10 g Oral Once  . vitamin B-12  100 mcg Oral Daily    Continuous Infusions: . dextrose 75 mL/hr at 08/07/19 0833     LOS: 17 days    Time spent: 35 minutes.     Elmarie Shiley, MD [facility] Pager [phone]  If 7PM-7AM, please contact night-coverage [email or web address] Password [facility] 08/07/2019, 9:57 AM

## 2019-08-07 NOTE — Progress Notes (Addendum)
Hypoglycemic Event  CBG: 26  Treatment: D50 50 mL (25 gm)  Symptoms: None  Follow-up CBG: Time:803 CBG Result:62  Possible Reasons for Event: Inadequate meal intake  Comments/MD notified: [place]; received orders.   Follow-up CBG's continue to be low. Md on unit. Received more orders.  RN will continue to monitor the patient.  Tinnie Gens

## 2019-08-07 NOTE — Progress Notes (Signed)
Report given to [facility] in stepdown unit. Patient transferred to ICU 1231.

## 2019-08-07 NOTE — Consult Note (Signed)
Reason for Consult: Low blood glucose measurements from fingerstick tests Referring Physician: Dr. Niel Hummer  [facility]. is an 73 y.o. male.  HPI: Louis Ford and his wife describe how he became weak before this hospital stay.  He notes that he dementia, seizures, and history of a stroke.  He has felt better overall since hospital admission.  No personal history of diabetes mellitus.  No personal use of any diabetes medicine (including insulin or oral hypoglycemic medicine).  No known history of hypoglycemia.  His wife provided a milkshake during this hospital stay on a previous day.    Past Medical History:  Diagnosis Date  . AICD (automatic cardioverter/defibrillator) present   . Atrial fibrillation   09/22/2012  . Blood loss anemia 04/18/2017   After GI bleed from colonoscopy and polypectomy  . CAD (coronary artery disease)   . Chronic systolic heart failure ([name])   . Factor VII deficiency ([place]) 05/2011  . Factor VII deficiency ([place]) 10/07/2012  . GIB (gastrointestinal bleeding) 02/20/2017  . History of MRSA infection 05/2011  . HTN (hypertension)   . Hx of adenomatous colonic polyps 02/20/2017   01/2017 - 3 cm sigmoid TV adenoma and other smaller polyps - had post-polypectomy bleed Tx w/ clips Consider repeat colonoscopy 3 yrs [name], MD, [name]   . Hyperlipidemia   . implantable cardiac defibrillator-Biotronik    Device Implanted 2006; s/p gen change 03/2011 : bleeding persistent with pocket erosion and infection; explant and reimplant  06/2011  . Ischemic cardiomyopathy    EF 15 to 20% by TTE and TEE in 09/2012.  Severe LV dysfunction  . Obesity    BMI 31 in 09/2012  . Persistent atrial fibrillation with rapid ventricular response 04/21/2014  . Retroperitoneal bleed 04/21/2019  . Seizure disorder ([place]) latest 09/30/2012  . Seizures ([place])   . Stroke [facility])     Past Surgical History:  Procedure Laterality Date  . ALVEOLOPLASTY N/A 07/26/2019   Procedure:  ALVEOLOPLASTY WITH RESUTURING OF ORAL WOUND;  Surgeon: [name], [name];  Location: [facility] ORS;  Service: Oral Surgery;  Laterality: N/A;  . CARDIAC CATHETERIZATION N/A 10/12/2015   Procedure: Right Heart Cath;  Surgeon: [name], MD;  Location: [place] CV LAB;  Service: Cardiovascular;  Laterality: N/A;  . CARDIOVERSION  09/24/2012   Procedure: CARDIOVERSION;  Surgeon: [name], MD;  Location: [facility] ENDOSCOPY;  Service: Cardiovascular;  Laterality: N/A;  . CARDIOVERSION N/A 12/25/2017   Procedure: CARDIOVERSION;  Surgeon: [name], MD;  Location: [facility] ORS;  Service: Cardiovascular;  Laterality: N/A;  . COLONOSCOPY W/ POLYPECTOMY  02/13/2017  . CORONARY ARTERY BYPASS GRAFT  2011   in [place]  . FLEXIBLE SIGMOIDOSCOPY N/A 02/20/2017   Procedure: FLEXIBLE SIGMOIDOSCOPY;  Surgeon: [name], MD;  Location: [facility] ENDOSCOPY;  Service: Endoscopy;  Laterality: N/A;  . FLEXIBLE SIGMOIDOSCOPY N/A 02/21/2017   Procedure: FLEXIBLE SIGMOIDOSCOPY;  Surgeon: [name], MD;  Location: [facility] ENDOSCOPY;  Service: Endoscopy;  Laterality: N/A;  . ICD    . TEE WITHOUT CARDIOVERSION  09/24/2012   Procedure: TRANSESOPHAGEAL ECHOCARDIOGRAM (TEE);  Surgeon: [name], MD;  Location: [facility] ENDOSCOPY;  Service: Cardiovascular;  Laterality: N/A;  dave/anesth, dl, cindy/echo     Family History  Problem Relation Age of Onset  . Arthritis Mother   . Heart disease Mother   . Heart attack Mother   . Other Father        smoker  . Hypertension Neg Hx  unknown  . Stroke Neg Hx        unknown    Social History:  reports that he quit smoking about 10 years ago. His smoking use included pipe. He quit smokeless tobacco use about 10 years ago. He reports that he does not drink alcohol or use drugs.  Allergies: No Known Allergies  Medications: I have reviewed the patient's current medications.  No recorded insulin dose within the past 10 days.  ROS  General: No weight loss, no fever. Respiratory:  No dyspnea, no cough. Gastrointestinal: No vomiting, no diarrhea. Hematologic:  Bleeds easily (including bleeding from gums).   Blood pressure 95/71, pulse (!) 110, temperature 97.7 F (36.5 C), temperature source Oral, resp. rate 20, height 5' 10.98" (1.803 m), weight 95.8 kg, SpO2 100 %. Physical Exam  General: No apparent distress. Eyes: Anicteric Neck: Supple, trachea midline. Thyroid: No enlargement, mobile without fixation, no tenderness. Cardiovascular: Regular rhythm, mildly rapid rate, no murmur, cool hands and fingers. Respiratory: Normal respiratory effort, clear to auscultation. Gastrointestinal: Slightly tender right upper quadrant of abdomen without distention or appreciable hepatomegaly. Neurologic: Conversant, alert, patellar deep tendon reflexes !+ and symmetric Musculoskeletal: Normal muscle tone, no muscle atrophy. Skin: Appropriate warmth except hands/fingers, no visible rash. Mental status:  speech relatively clear, thought logical, mildly restricted affect, no hallucinations or delusions evident. Hematologic/lymphatic: No cervical adenopathy, no jaundice.  Lab Results  Component Value Date   LABCORT 10.4 08/06/2019   GLUCOSE 60 (L) 08/07/2019   GLUCOSE 79 08/07/2019   GLUCOSE 123 (H) 08/06/2019   GLUCOSE 110 (H) 08/05/2019   GLUCOSE 80 08/03/2019   GLUCOSE 120 (H) 08/02/2019   GLUCOSE 91 07/30/2019   GLUCOSE 95 07/29/2019   GLUCOSE 99 07/28/2019   GLUCOSE 103 (H) 07/26/2019   HGBA1C 5.2 07/25/2019   HGBA1C 5.8 11/26/2018   HGBA1C 5.4 11/27/2017   MPG 102.54 07/25/2019   MPG 120 (H) 09/21/2012   FREET4 0.86 11/26/2018   TSH 5.695 (H) 08/06/2019   TSH 9.80 (H) 11/26/2018   NA 135 08/07/2019   K 5.5 (H) 08/07/2019   CT angiography of chest, abdomen, and pelvis on 04/21/2019:  Pancreas, spleen, and adrenal glands had unremarkable appearance.    Assessment/Plan: 1.  Low fingerstick blood glucose measurements  His fingerstick blood glucose measurement  was 16 mg/dL during my visit.  He seemed alert and remained conversant throughout that visit.  I spoke with his wife, his attending physician (Dr. Niel Hummer), and his ICU nurse [facility]).  His fingerstick blood glucose measurements might be misleading.    I recommend the following: STAT plasma glucose level now and again if hypoglycemia is suspected  If the plasma glucose level is less than 55 mg/dL, then check the following lab tests from that blood sample: Insulin, proinsulin, C-peptide, sulfonylurea/hypoglycemic panel, insulin antibodies  If his plasma glucose levels are not lower than 55 mg/dL, then hypoglycemia is unlikely.    Louis Ford 08/07/2019, 1:40 PM

## 2019-08-07 NOTE — Progress Notes (Signed)
[facility]. PROGRESS NOTE:   SUBJECTIVE: without complaints. Some oral bleeding  OBJECTIVE:  Vitals: Blood pressure 133/90, pulse (!) 110, temperature 97.7 F (36.5 C), temperature source Oral, resp. rate (!) 26, height 5' 10.98" (1.803 m), weight 95.8 kg, SpO2 100 %. Lab results: Results for orders placed or performed during the hospital encounter of 07/20/19 (from the past 24 hour(s))  Glucose, capillary     Status: Abnormal   Collection Time: 08/06/19  5:14 PM  Result Value Ref Range   Glucose-Capillary 39 (LL) 70 - 99 mg/dL   Comment 1 Notify RN    Comment 2 Document in Chart   Glucose, capillary     Status: Abnormal   Collection Time: 08/06/19  5:44 PM  Result Value Ref Range   Glucose-Capillary 26 (LL) 70 - 99 mg/dL   Comment 1 Notify RN    Comment 2 Document in Chart   Glucose, capillary     Status: None   Collection Time: 08/06/19  6:44 PM  Result Value Ref Range   Glucose-Capillary 81 70 - 99 mg/dL   Comment 1 Notify RN    Comment 2 Document in Chart   Glucose, capillary     Status: Abnormal   Collection Time: 08/06/19  9:15 PM  Result Value Ref Range   Glucose-Capillary 54 (L) 70 - 99 mg/dL   Comment 1 Notify RN   Glucose, capillary     Status: None   Collection Time: 08/06/19 10:13 PM  Result Value Ref Range   Glucose-Capillary 86 70 - 99 mg/dL  CBC     Status: Abnormal   Collection Time: 08/07/19  5:46 AM  Result Value Ref Range   WBC 4.7 4.0 - 10.5 K/uL   RBC 2.84 (L) 4.22 - 5.81 MIL/uL   Hemoglobin 7.8 (L) 13.0 - 17.0 g/dL   HCT 27.1 (L) 39.0 - 52.0 %   MCV 95.4 80.0 - 100.0 fL   MCH 27.5 26.0 - 34.0 pg   MCHC 28.8 (L) 30.0 - 36.0 g/dL   RDW 15.4 11.5 - 15.5 %   Platelets 262 150 - 400 K/uL   nRBC 0.0 0.0 - 0.2 %  Basic metabolic panel     Status: Abnormal   Collection Time: 08/07/19  5:46 AM  Result Value Ref Range   Sodium 135 135 - 145 mmol/L   Potassium 5.5 (H) 3.5 - 5.1 mmol/L   Chloride 108 98 - 111 mmol/L   CO2 22 22 - 32 mmol/L   Glucose, Bld 79 70 - 99 mg/dL   BUN 12 8 - 23 mg/dL   Creatinine, Ser 1.01 0.61 - 1.24 mg/dL   Calcium 7.7 (L) 8.9 - 10.3 mg/dL   GFR calc non Af Amer >60 >60 mL/min   GFR calc Af Amer >60 >60 mL/min   Anion gap 5 5 - 15  Glucose, capillary     Status: Abnormal   Collection Time: 08/07/19  7:36 AM  Result Value Ref Range   Glucose-Capillary 26 (LL) 70 - 99 mg/dL   Comment 1 Notify RN   Glucose, capillary     Status: Abnormal   Collection Time: 08/07/19  8:08 AM  Result Value Ref Range   Glucose-Capillary 62 (L) 70 - 99 mg/dL  Glucose, capillary     Status: Abnormal   Collection Time: 08/07/19  8:49 AM  Result Value Ref Range   Glucose-Capillary 69 (L) 70 - 99 mg/dL  Glucose, capillary     Status:  Abnormal   Collection Time: 08/07/19  9:30 AM  Result Value Ref Range   Glucose-Capillary 52 (L) 70 - 99 mg/dL  Glucose, capillary     Status: Abnormal   Collection Time: 08/07/19 10:58 AM  Result Value Ref Range   Glucose-Capillary 50 (L) 70 - 99 mg/dL  Glucose, capillary     Status: Abnormal   Collection Time: 08/07/19 11:43 AM  Result Value Ref Range   Glucose-Capillary 45 (L) 70 - 99 mg/dL  Glucose, capillary     Status: Abnormal   Collection Time: 08/07/19 12:25 PM  Result Value Ref Range   Glucose-Capillary 16 (LL) 70 - 99 mg/dL  Glucose, random     Status: Abnormal   Collection Time: 08/07/19  1:03 PM  Result Value Ref Range   Glucose, Bld 60 (L) 70 - 99 mg/dL   Radiology Results: [place] Ekg Site Rite  Result Date: 08/07/2019 If Site Rite image not attached, placement could not be confirmed due to current cardiac rhythm.  [place] Ekg Site Rite  Result Date: 08/07/2019 If Site Rite image not attached, placement could not be confirmed due to current cardiac rhythm.  [place] Ekg Site Rite  Result Date: 08/07/2019 If Site Rite image not attached, placement could not be confirmed due to current cardiac rhythm.  General appearance: alert, cooperative and no distress Head:  Normocephalic, without obvious abnormality, atraumatic Eyes: negative Nose: Nares normal. Septum midline. Mucosa normal. No drainage or sinus tenderness. Throat: immature blood clots mandibular right and left. slight oozing, pharynx clear. Neck: no adenopathy  ASSESSMENT: Recurrent episodic bleeding due to immature blood clots mandibular right and left  PLAN: to OR in am to debride immature clots and  re-suture.General anesthesia   Louis Ford 08/07/2019

## 2019-08-07 NOTE — Progress Notes (Signed)
PT Cancellation Note  Patient Details Name: [facility]. MRN: [id number] DOB: 04/01/1946   Cancelled Treatment:    Reason Eval/Treat Not Completed: Medical issues which prohibited therapy - Pt with hypoglycemic event, transferred to ICU. Will continue to follow.   Julien Girt, PT Acute Rehabilitation Services Pager [phone]  Office [phone]    Roxine Caddy D Elonda Husky 08/07/2019, 12:40 PM

## 2019-08-08 ENCOUNTER — Encounter (HOSPITAL_COMMUNITY)
Admission: EM | Disposition: A | Discharge status: Skilled Nursing Facility | Payer: Self-pay | Source: Home / Self Care | Attending: Internal Medicine

## 2019-08-08 ENCOUNTER — Inpatient Hospital Stay: Admit: 2019-08-08 | Payer: Medicare Other | Admitting: Oral Surgery

## 2019-08-08 ENCOUNTER — Inpatient Hospital Stay (HOSPITAL_COMMUNITY): Payer: Medicare Other | Admitting: Anesthesiology

## 2019-08-08 ENCOUNTER — Encounter (HOSPITAL_COMMUNITY): Payer: Self-pay | Admitting: *Deleted

## 2019-08-08 DIAGNOSIS — I472 Ventricular tachycardia: Secondary | ICD-10-CM

## 2019-08-08 HISTORY — PX: SUBMANDIBULAR GLAND EXCISION: SHX2456

## 2019-08-08 LAB — CBC WITH DIFFERENTIAL/PLATELET
Abs Immature Granulocytes: 0.02 10*3/uL (ref 0.00–0.07)
Basophils Absolute: 0 10*3/uL (ref 0.0–0.1)
Basophils Relative: 0 %
Eosinophils Absolute: 0.5 10*3/uL (ref 0.0–0.5)
Eosinophils Relative: 9 %
HCT: 28.6 % — ABNORMAL LOW (ref 39.0–52.0)
Hemoglobin: 8.9 g/dL — ABNORMAL LOW (ref 13.0–17.0)
Immature Granulocytes: 0 %
Lymphocytes Relative: 21 %
Lymphs Abs: 1.1 10*3/uL (ref 0.7–4.0)
MCH: 29.1 pg (ref 26.0–34.0)
MCHC: 31.1 g/dL (ref 30.0–36.0)
MCV: 93.5 fL (ref 80.0–100.0)
Monocytes Absolute: 0.9 10*3/uL (ref 0.1–1.0)
Monocytes Relative: 17 %
Neutro Abs: 2.8 10*3/uL (ref 1.7–7.7)
Neutrophils Relative %: 53 %
Platelets: 226 10*3/uL (ref 150–400)
RBC: 3.06 MIL/uL — ABNORMAL LOW (ref 4.22–5.81)
RDW: 15.2 % (ref 11.5–15.5)
WBC: 5.3 10*3/uL (ref 4.0–10.5)
nRBC: 0 % (ref 0.0–0.2)

## 2019-08-08 LAB — MRSA PCR SCREENING: MRSA by PCR: NEGATIVE

## 2019-08-08 LAB — GLUCOSE, CAPILLARY
Glucose-Capillary: 75 mg/dL (ref 70–99)
Glucose-Capillary: 104 mg/dL — ABNORMAL HIGH (ref 70–99)
Glucose-Capillary: 78 mg/dL (ref 70–99)

## 2019-08-08 LAB — TYPE AND SCREEN
ABO/RH(D): O POS
Antibody Screen: NEGATIVE
Unit division: 0

## 2019-08-08 LAB — BASIC METABOLIC PANEL
Anion gap: 3 — ABNORMAL LOW (ref 5–15)
BUN: 12 mg/dL (ref 8–23)
CO2: 24 mmol/L (ref 22–32)
Calcium: 7.8 mg/dL — ABNORMAL LOW (ref 8.9–10.3)
Chloride: 108 mmol/L (ref 98–111)
Creatinine, Ser: 1.06 mg/dL (ref 0.61–1.24)
GFR calc Af Amer: >60 mL/min (ref >60)
GFR calc non Af Amer: >60 mL/min (ref >60)
Glucose, Bld: 89 mg/dL (ref 70–99)
Potassium: 4.8 mmol/L (ref 3.5–5.1)
Sodium: 135 mmol/L (ref 135–145)

## 2019-08-08 LAB — MAGNESIUM: Magnesium: 1.9 mg/dL (ref 1.7–2.4)

## 2019-08-08 LAB — CBC
HCT: 28.4 % — ABNORMAL LOW (ref 39.0–52.0)
Hemoglobin: 8.7 g/dL — ABNORMAL LOW (ref 13.0–17.0)
MCH: 28.6 pg (ref 26.0–34.0)
MCHC: 30.6 g/dL (ref 30.0–36.0)
MCV: 93.4 fL (ref 80.0–100.0)
Platelets: 213 10*3/uL (ref 150–400)
RBC: 3.04 MIL/uL — ABNORMAL LOW (ref 4.22–5.81)
RDW: 15.2 % (ref 11.5–15.5)
WBC: 5.2 10*3/uL (ref 4.0–10.5)
nRBC: 0 % (ref 0.0–0.2)

## 2019-08-08 LAB — ACTH STIMULATION, 3 TIME POINTS
Cortisol, 30 Min: 6.9 ug/dL
Cortisol, 60 Min: 23 ug/dL
Cortisol, Base: 18 ug/dL

## 2019-08-08 LAB — HEMOGLOBIN AND HEMATOCRIT, BLOOD
HCT: 30.1 % — ABNORMAL LOW (ref 39.0–52.0)
Hemoglobin: 9.3 g/dL — ABNORMAL LOW (ref 13.0–17.0)

## 2019-08-08 LAB — C-PEPTIDE: C-Peptide: 7.5 ng/mL — ABNORMAL HIGH (ref 1.1–4.4)

## 2019-08-08 LAB — BPAM RBC
Blood Product Expiration Date: 202009102359
ISSUE DATE / TIME: 202008141841
Unit Type and Rh: 5100

## 2019-08-08 LAB — INSULIN, RANDOM: Insulin: 17.4 u[IU]/mL (ref 2.6–24.9)

## 2019-08-08 SURGERY — EXCISION, SUBMANDIBULAR GLAND
Anesthesia: General | Site: Mouth

## 2019-08-08 MED ORDER — FENTANYL CITRATE (PF) 100 MCG/2ML IJ SOLN
INTRAMUSCULAR | Status: AC
  Filled 2019-08-08: qty 2

## 2019-08-08 MED ORDER — FENTANYL CITRATE (PF) 100 MCG/2ML IJ SOLN
INTRAMUSCULAR | Status: DC | PRN
  Administered 2019-08-08 (×4): 50 ug via INTRAVENOUS

## 2019-08-08 MED ORDER — DEXMEDETOMIDINE HCL IN NACL 200 MCG/50ML IV SOLN
INTRAVENOUS | Status: DC | PRN
  Administered 2019-08-08: 0.7 ug/kg/h via INTRAVENOUS

## 2019-08-08 MED ORDER — SUCCINYLCHOLINE CHLORIDE 200 MG/10ML IV SOSY
PREFILLED_SYRINGE | INTRAVENOUS | Status: DC | PRN
  Administered 2019-08-08: 100 mg via INTRAVENOUS

## 2019-08-08 MED ORDER — LACTATED RINGERS IV SOLN
INTRAVENOUS | Status: DC | PRN
  Administered 2019-08-08: 09:00:00 via INTRAVENOUS

## 2019-08-08 MED ORDER — LIDOCAINE 2% (20 MG/ML) 5 ML SYRINGE
INTRAMUSCULAR | Status: DC | PRN
  Administered 2019-08-08: 80 mg via INTRAVENOUS

## 2019-08-08 MED ORDER — SODIUM CHLORIDE 0.9 % IV SOLN
INTRAVENOUS | Status: DC | PRN
  Administered 2019-08-08: 10:00:00 30 ug/min via INTRAVENOUS

## 2019-08-08 MED ORDER — 0.9 % SODIUM CHLORIDE (POUR BTL) OPTIME
TOPICAL | Status: DC | PRN
  Administered 2019-08-08: 1000 mL

## 2019-08-08 MED ORDER — DEXMEDETOMIDINE HCL IN NACL 200 MCG/50ML IV SOLN
INTRAVENOUS | Status: AC
  Filled 2019-08-08: qty 100

## 2019-08-08 MED ORDER — CEFAZOLIN SODIUM-DEXTROSE 2-4 GM/100ML-% IV SOLN
INTRAVENOUS | Status: AC
  Filled 2019-08-08: qty 100

## 2019-08-08 MED ORDER — THROMBIN 5000 UNITS EX SOLR: OROMUCOSAL | Status: DC | PRN

## 2019-08-08 MED ORDER — FENTANYL CITRATE (PF) 100 MCG/2ML IJ SOLN: 25.0000 ug | INTRAMUSCULAR | Status: DC | PRN

## 2019-08-08 MED ORDER — PHENYLEPHRINE 40 MCG/ML (10ML) SYRINGE FOR IV PUSH (FOR BLOOD PRESSURE SUPPORT)
PREFILLED_SYRINGE | INTRAVENOUS | Status: DC | PRN
  Administered 2019-08-08: 80 ug via INTRAVENOUS

## 2019-08-08 MED ORDER — PROPOFOL 10 MG/ML IV BOLUS
INTRAVENOUS | Status: DC | PRN
  Administered 2019-08-08: 150 mg via INTRAVENOUS

## 2019-08-08 MED ORDER — METOCLOPRAMIDE HCL 5 MG/ML IJ SOLN: 10.0000 mg | Freq: Once | INTRAMUSCULAR | Status: DC | PRN

## 2019-08-08 MED ORDER — BUPIVACAINE-EPINEPHRINE (PF) 0.25% -1:200000 IJ SOLN
INTRAMUSCULAR | Status: AC
  Filled 2019-08-08: qty 30

## 2019-08-08 MED ORDER — PROPOFOL 10 MG/ML IV BOLUS
INTRAVENOUS | Status: AC
  Filled 2019-08-08: qty 20

## 2019-08-08 MED ORDER — THROMBIN 5000 UNITS EX SOLR
CUTANEOUS | Status: DC | PRN
  Administered 2019-08-08: 5000 [IU] via TOPICAL

## 2019-08-08 MED ORDER — PHENYLEPHRINE 40 MCG/ML (10ML) SYRINGE FOR IV PUSH (FOR BLOOD PRESSURE SUPPORT)
PREFILLED_SYRINGE | INTRAVENOUS | Status: AC
  Filled 2019-08-08: qty 10

## 2019-08-08 MED ORDER — MEPERIDINE HCL 50 MG/ML IJ SOLN: 6.2500 mg | INTRAMUSCULAR | Status: DC | PRN

## 2019-08-08 MED ORDER — THROMBIN (RECOMBINANT) 5000 UNITS EX SOLR
CUTANEOUS | Status: AC
  Filled 2019-08-08: qty 5000

## 2019-08-08 MED ORDER — BUPIVACAINE-EPINEPHRINE (PF) 0.25% -1:200000 IJ SOLN
INTRAMUSCULAR | Status: DC | PRN
  Administered 2019-08-08: 8 mL

## 2019-08-08 SURGICAL SUPPLY — 20 items
BLADE SURG 15 STRL LF DISP TIS (BLADE) ×2 IMPLANT
BLADE SURG 15 STRL SS (BLADE) ×4
COVER WAND RF STERILE (DRAPES) IMPLANT
GAUZE 4X4 16PLY RFD (DISPOSABLE) ×6 IMPLANT
GAUZE PACKING 2X5 YD STRL (GAUZE/BANDAGES/DRESSINGS) ×3 IMPLANT
GLOVE BIO SURGEON STRL SZ 6.5 (GLOVE) IMPLANT
GLOVE BIO SURGEON STRL SZ7.5 (GLOVE) ×6 IMPLANT
GLOVE BIO SURGEONS STRL SZ 6.5 (GLOVE)
KIT BASIN OR (CUSTOM PROCEDURE TRAY) ×3 IMPLANT
KIT TURNOVER KIT A (KITS) IMPLANT
NEEDLE BLUNT 17GA (NEEDLE) ×3 IMPLANT
NEEDLE HYPO 22GX1.5 SAFETY (NEEDLE) ×3 IMPLANT
NS IRRIG 1000ML POUR BTL (IV SOLUTION) ×3 IMPLANT
PACK EENT SPLIT (PACKS) ×3 IMPLANT
PROTECTOR NERVE ULNAR (MISCELLANEOUS) ×9 IMPLANT
SPONGE SURGIFOAM ABS GEL 12-7 (HEMOSTASIS) ×3 IMPLANT
SUT CHROMIC 3 0 PS 2 (SUTURE) ×12 IMPLANT
SYR 50ML LL SCALE MARK (SYRINGE) ×3 IMPLANT
WATER STERILE IRR 1000ML POUR (IV SOLUTION) ×3 IMPLANT
YANKAUER SUCT BULB TIP 10FT TU (MISCELLANEOUS) ×3 IMPLANT

## 2019-08-08 NOTE — Anesthesia Preprocedure Evaluation (Signed)
Anesthesia Evaluation  Patient identified by MRN, date of birth, ID band Patient awake    Reviewed: Allergy & Precautions, NPO status , Patient's Chart, lab work & pertinent test results, reviewed documented beta blocker date and time   Airway Mallampati: II  TM Distance: >3 FB Neck ROM: Full    Dental  (+) Edentulous Upper, Poor Dentition   Pulmonary former smoker,    Pulmonary exam normal breath sounds clear to auscultation       Cardiovascular hypertension, Pt. on medications and Pt. 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 There was mild concentric hypertrophy. Systolic function was normal. The estimated ejection fraction was in the range of 50% to 55%. Mild hypokinesis of the inferolateral myocardium. - Ventricular septum: Septal motion showed abnormal function and dyssynchrony. - Aortic valve: Transvalvular velocity was within the normal range.There was no stenosis. There was no regurgitation. - Mitral valve: Transvalvular velocity was within the normal range. There was no evidence for stenosis. There was mild regurgitation directed posteriorly. - Left atrium: The atrium was severely dilated. - Right ventricle: The cavity size was normal. Wall thickness was normal. Systolic function was normal. - Right atrium: The atrium was mildly dilated. - Tricuspid valve: There was mild regurgitation. - Pulmonary arteries: Systolic pressure was within the normal range. PA peak pressure: 32 mm Hg (S).  EKG 07/25/2019  NSR, incomplete LBBB pattern, LAD, prolonged QTc   Neuro/Psych Seizures -, Well Controlled,  CVA, Residual Symptoms negative psych ROS   GI/Hepatic Neg liver ROS, Oral wound   Endo/Other  Hyperlipidemia  Renal/GU Renal InsufficiencyRenal  disease  negative genitourinary   Musculoskeletal  (+) Arthritis , Osteoarthritis,    Abdominal   Peds  Hematology  (+) Blood dyscrasia, anemia , Factor VII deficiency- but most recent test 07/22/2019 shows normal activity aPTT and PT both elevated INR normal   Anesthesia Other Findings   Reproductive/Obstetrics                             Anesthesia Physical  Anesthesia Plan  ASA: III and emergent  Anesthesia Plan: General   Post-op Pain Management:    Induction: Intravenous  PONV Risk Score and Plan: 3 and Ondansetron and Treatment may vary due to age or medical condition  Airway Management Planned: Oral ETT  Additional Equipment:   Intra-op Plan:   Post-operative Plan: Extubation in OR  Informed Consent: I have reviewed the patients History and Physical, chart, labs and discussed the procedure including the risks, benefits and alternatives for the proposed anesthesia with the patient or authorized representative who has indicated his/her understanding and acceptance.     Dental advisory given  Plan Discussed with: CRNA and Surgeon  Anesthesia Plan Comments: (.)        Anesthesia Quick Evaluation

## 2019-08-08 NOTE — Progress Notes (Signed)
[facility]. PROGRESS NOTE:   SUBJECTIVE: Resting comfortably.  OBJECTIVE:  Vitals: Blood pressure 100/83, pulse 92, temperature (!) 96.8 F (36 C), temperature source Axillary, resp. rate (!) 24, height 5' 10.98" (1.803 m), weight 95.8 kg, SpO2 92 %. Lab results: Results for orders placed or performed during the hospital encounter of 07/20/19 (from the past 24 hour(s))  Glucose, capillary     Status: Abnormal   Collection Time: 08/07/19  5:43 PM  Result Value Ref Range   Glucose-Capillary 58 (L) 70 - 99 mg/dL  Glucose, capillary     Status: None   Collection Time: 08/07/19  6:32 PM  Result Value Ref Range   Glucose-Capillary 78 70 - 99 mg/dL  C-peptide     Status: Abnormal   Collection Time: 08/07/19  7:20 PM  Result Value Ref Range   C-Peptide 7.5 (H) 1.1 - 4.4 ng/mL  Insulin, random     Status: None   Collection Time: 08/07/19  7:20 PM  Result Value Ref Range   Insulin 17.4 2.6 - 24.9 uIU/mL  Glucose, capillary     Status: None   Collection Time: 08/07/19  9:23 PM  Result Value Ref Range   Glucose-Capillary 82 70 - 99 mg/dL   Comment 1 Notify RN    Comment 2 Document in Chart   CBC with Differential/Platelet     Status: Abnormal   Collection Time: 08/08/19 12:02 AM  Result Value Ref Range   WBC 5.3 4.0 - 10.5 K/uL   RBC 3.06 (L) 4.22 - 5.81 MIL/uL   Hemoglobin 8.9 (L) 13.0 - 17.0 g/dL   HCT 28.6 (L) 39.0 - 52.0 %   MCV 93.5 80.0 - 100.0 fL   MCH 29.1 26.0 - 34.0 pg   MCHC 31.1 30.0 - 36.0 g/dL   RDW 15.2 11.5 - 15.5 %   Platelets 226 150 - 400 K/uL   nRBC 0.0 0.0 - 0.2 %   Neutrophils Relative % 53 %   Neutro Abs 2.8 1.7 - 7.7 K/uL   Lymphocytes Relative 21 %   Lymphs Abs 1.1 0.7 - 4.0 K/uL   Monocytes Relative 17 %   Monocytes Absolute 0.9 0.1 - 1.0 K/uL   Eosinophils Relative 9 %   Eosinophils Absolute 0.5 0.0 - 0.5 K/uL   Basophils Relative 0 %   Basophils Absolute 0.0 0.0 - 0.1 K/uL   Immature Granulocytes 0 %   Abs Immature Granulocytes 0.02 0.00 -  0.07 K/uL  ACTH stimulation, 3 time points     Status: None   Collection Time: 08/08/19  5:45 AM  Result Value Ref Range   Cortisol, Base 18.0 ug/dL   Cortisol, 30 Min 6.9 ug/dL   Cortisol, 60 Min 23.0 ug/dL  CBC     Status: Abnormal   Collection Time: 08/08/19  5:45 AM  Result Value Ref Range   WBC 5.2 4.0 - 10.5 K/uL   RBC 3.04 (L) 4.22 - 5.81 MIL/uL   Hemoglobin 8.7 (L) 13.0 - 17.0 g/dL   HCT 28.4 (L) 39.0 - 52.0 %   MCV 93.4 80.0 - 100.0 fL   MCH 28.6 26.0 - 34.0 pg   MCHC 30.6 30.0 - 36.0 g/dL   RDW 15.2 11.5 - 15.5 %   Platelets 213 150 - 400 K/uL   nRBC 0.0 0.0 - 0.2 %  Basic metabolic panel     Status: Abnormal   Collection Time: 08/08/19  5:45 AM  Result Value Ref Range  Sodium 135 135 - 145 mmol/L   Potassium 4.8 3.5 - 5.1 mmol/L   Chloride 108 98 - 111 mmol/L   CO2 24 22 - 32 mmol/L   Glucose, Bld 89 70 - 99 mg/dL   BUN 12 8 - 23 mg/dL   Creatinine, Ser 1.06 0.61 - 1.24 mg/dL   Calcium 7.8 (L) 8.9 - 10.3 mg/dL   GFR calc non Af Amer >60 >60 mL/min   GFR calc Af Amer >60 >60 mL/min   Anion gap 3 (L) 5 - 15  MRSA PCR Screening     Status: None   Collection Time: 08/08/19  7:01 AM   Specimen: Nasal Mucosa; Nasopharyngeal  Result Value Ref Range   MRSA by PCR NEGATIVE NEGATIVE  Glucose, capillary     Status: None   Collection Time: 08/08/19  7:46 AM  Result Value Ref Range   Glucose-Capillary 75 70 - 99 mg/dL  Glucose, capillary     Status: Abnormal   Collection Time: 08/08/19 11:22 AM  Result Value Ref Range   Glucose-Capillary 104 (H) 70 - 99 mg/dL  Hemoglobin and hematocrit, blood     Status: Abnormal   Collection Time: 08/08/19  5:00 PM  Result Value Ref Range   Hemoglobin 9.3 (L) 13.0 - 17.0 g/dL   HCT 30.1 (L) 39.0 - 52.0 %   Radiology Results: [place] Ekg Site Rite  Result Date: 08/07/2019 If Site Rite image not attached, placement could not be confirmed due to current cardiac rhythm.  [place] Ekg Site Rite  Result Date: 08/07/2019 If Site Rite image  not attached, placement could not be confirmed due to current cardiac rhythm.  [place] Ekg Site Rite  Result Date: 08/07/2019 If Site Rite image not attached, placement could not be confirmed due to current cardiac rhythm.  General appearance: alert, cooperative and no distress Head: Normocephalic, without obvious abnormality, atraumatic Eyes: negative Nose: Nares normal. Septum midline. Mucosa normal. No drainage or sinus tenderness. Throat: sutures intact. slight oozing. Oral gauze in place. Pharynx clear.  Neck: no adenopathy  ASSESSMENT: Stable s/p oral suturing.  PLAN: Continue current treatment.    [name] 08/08/2019

## 2019-08-08 NOTE — Anesthesia Procedure Notes (Signed)
Procedure Name: Intubation Date/Time: 08/08/2019 10:06 AM Performed by: Lavina Hamman, CRNA Pre-anesthesia Checklist: Patient identified, Emergency Drugs available, Suction available, Patient being monitored and Timeout performed Patient Re-evaluated:Patient Re-evaluated prior to induction Oxygen Delivery Method: Circle system utilized Preoxygenation: Pre-oxygenation with 100% oxygen Induction Type: IV induction Ventilation: Mask ventilation without difficulty Laryngoscope Size: Mac and 4 Grade View: Grade II Tube type: Oral Tube size: 7.0 mm Number of attempts: 1 Airway Equipment and Method: Stylet Placement Confirmation: ETT inserted through vocal cords under direct vision,  positive ETCO2,  CO2 detector and breath sounds checked- equal and bilateral Secured at: 23 cm Tube secured with: Tape Dental Injury: Teeth and Oropharynx as per pre-operative assessment  Comments: Ett marked at 23cm per Dr [name].

## 2019-08-08 NOTE — Final Consult Note (Signed)
Consultant Final Sign-Off Note    Assessment/Final recommendations  [facility]. is a 73 y.o. male  evaluated by me due to low fingerstick glucose measurements.  I visited his ICU room this morning.  However, he was in the operating room.  I regret that I was not able to visit him.    His low fingerstick glucose measurements were apparently misleading.  His plasma glucose levels have been normal, including a plasma glucose level of 60 mg/dL (which is normal for a person without diabetes mellitus) when fingerstick glucose measurement was 16 mg/dL.  Those two collections were obtained during my consult visit yesterday without dextrose bolus or caloric intake by mouth or glucagon or other measures to correct hypoglycemia.  He seemed alert and was conversant when that fingerstick glucose measurement was 16 mg/dL.  No clear lab evidence or clinical evidence of hypoglycemia despite those fingerstick measurements.   His insulin and C-peptide levels were obtained when he had normal glucose measurements.  Therefore, those laboratory results are not helpful with the evaluation.  His Cosyntropin stimulated cortisol level reached 23 (a normal response).  This excludes primary adrenal insufficiency or longstanding central adrenal insufficiency.    Lab Results  Component Value Date   GLUCOSE 89 08/08/2019   GLUCOSE 95 08/07/2019   GLUCOSE 60 (L) 08/07/2019   GLUCOSE 79 08/07/2019   GLUCOSE 123 (H) 08/06/2019   GLUCOSE 110 (H) 08/05/2019   GLUCOSE 80 08/03/2019   GLUCOSE 120 (H) 08/02/2019   GLUCOSE 91 07/30/2019   GLUCOSE 95 07/29/2019      Other recommendations: If his plasma glucose level becomes lower than 55 mg/dL, obtain the following lab tests from that blood sample before administering dextrose or food or other sources of carbohydrates: insulin, C-peptide, proinsulin, sulfonylurea/hypoglycemic panel   Thank you for allowing me to participate in the care of your patient.  Please consult me  again if you have further needs for your patient.  Ailish Prospero 08/08/2019 1:29 PM

## 2019-08-08 NOTE — Anesthesia Postprocedure Evaluation (Signed)
Anesthesia Post Note  Patient: [name].  Procedure(s) Performed: suture of oral wounds (N/A Mouth)     Patient location during evaluation: PACU Anesthesia Type: General Level of consciousness: awake and alert Pain management: pain level controlled Vital Signs Assessment: post-procedure vital signs reviewed and stable Respiratory status: spontaneous breathing, nonlabored ventilation, respiratory function stable and patient connected to nasal cannula oxygen Cardiovascular status: blood pressure returned to baseline and stable Postop Assessment: no apparent nausea or vomiting Anesthetic complications: no    Last Vitals:  Vitals:   08/08/19 1214 08/08/19 1232  BP: (!) 71/48 (!) 71/46  Pulse: 74 (!) 41  Resp: (!) 22 (!) 0  Temp:    SpO2: 100% 100%    Last Pain:  Vitals:   08/08/19 1145  TempSrc:   PainSc: 0-No pain                 Montez Hageman

## 2019-08-08 NOTE — Consult Note (Signed)
Cardiology Consultation:   Patient ID: [facility]. MRN: [id number]; DOB: Jul 10, 1946  Admit date: 07/20/2019 Date of Consult: 08/08/2019  Primary Care Provider: [name], MD Primary Cardiologist: No primary care provider on file. Dr. Reine Just Primary Electrophysiologist:  None GT   Patient Profile:   Louis Ford. is a 73 y.o. male with a hx of chronic systolic heart failure who is being seen today for the evaluation of possible ICD malfunction at the request of Dr. Tyrell Antonio.   History of Present Illness:   Louis Ford is hospital course has been notable for being admitted dental extractions 2 weeks ago. He has had ongoing bleeding and has required additional surgery. Today he came out of surgery and was noted to be hypotensive. His telemetry demonstrated some undersensing. He has a longstanding h/o severe LV dysfunction as well as VT/VF and has reached ERI on his ICD. He has been on amiodarone. No recent ventricular arrhythmias though he had some back in May/June. He denies a h/o clotting problems.   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   GIB (gastrointestinal bleeding) 02/20/2017   History of MRSA infection 05/2011   HTN (hypertension)    Hx of adenomatous colonic polyps 02/20/2017   01/2017 - 3 cm sigmoid TV adenoma and other smaller polyps - had post-polypectomy bleed Tx w/ clips Consider repeat colonoscopy 3 yrs [name], MD, [facility]    Hyperlipidemia    implantable cardiac defibrillator-Biotronik    Device Implanted 2006; s/p gen change 03/2011 : bleeding persistent with pocket erosion and infection;  explant and reimplant  06/2011   Ischemic cardiomyopathy    EF 15 to 20% by TTE and TEE in 09/2012.  Severe LV dysfunction   Obesity    BMI 31 in 09/2012   Persistent atrial fibrillation with rapid ventricular response 04/21/2014   Retroperitoneal bleed 04/21/2019   Seizure disorder ([place]) latest 09/30/2012   Seizures ([place])    Stroke [facility])     Past Surgical History:  Procedure Laterality Date   ALVEOLOPLASTY N/A 07/26/2019   Procedure: ALVEOLOPLASTY WITH RESUTURING OF ORAL WOUND;  Surgeon: [name], [name];  Location: [facility] ORS;  Service: Oral Surgery;  Laterality: N/A;   CARDIAC CATHETERIZATION N/A 10/12/2015   Procedure: Right Heart Cath;  Surgeon: [name], MD;  Location: [place] CV LAB;  Service: Cardiovascular;  Laterality: N/A;   CARDIOVERSION  09/24/2012   Procedure: CARDIOVERSION;  Surgeon: [name], MD;  Location: [facility] ENDOSCOPY;  Service: Cardiovascular;  Laterality: N/A;   CARDIOVERSION N/A 12/25/2017   Procedure: CARDIOVERSION;  Surgeon: [name], MD;  Location: [facility] ORS;  Service: Cardiovascular;  Laterality: N/A;   COLONOSCOPY W/ POLYPECTOMY  02/13/2017   CORONARY ARTERY BYPASS GRAFT  2011   in [place] N/A 02/20/2017   Procedure: FLEXIBLE SIGMOIDOSCOPY;  Surgeon: [name], MD;  Location: [facility] ENDOSCOPY;  Service: Endoscopy;  Laterality: N/A;   FLEXIBLE SIGMOIDOSCOPY N/A 02/21/2017   Procedure: FLEXIBLE SIGMOIDOSCOPY;  Surgeon: [name], MD;  Location: [facility] ENDOSCOPY;  Service: Endoscopy;  Laterality: N/A;   ICD  TEE WITHOUT CARDIOVERSION  09/24/2012   Procedure: TRANSESOPHAGEAL ECHOCARDIOGRAM (TEE);  Surgeon: [name], MD;  Location: [facility] ENDOSCOPY;  Service: Cardiovascular;  Laterality: N/A;  dave/anesth, dl, cindy/echo      Home Medications:  Prior to Admission medications   Medication Sig Start Date End Date Taking? Authorizing Provider  acetaminophen-codeine (TYLENOL #3) 300-30 MG tablet Take 1 tablet  by mouth every 6 (six) hours as needed for moderate pain or severe pain.  07/16/19  Yes [provider]  amiodarone (PACERONE) 200 MG tablet Take 1 tablet by mouth once daily 05/21/19  Yes [name], [name], MD  amoxicillin (AMOXIL) 500 MG capsule Take 500 mg by mouth 3 (three) times daily.  07/16/19  Yes [provider]  carvedilol (COREG) 12.5 MG tablet TAKE 1 TABLET BY MOUTH TWICE DAILY WITH A MEAL 06/19/19  Yes [name], MD  divalproex (DEPAKOTE ER) 500 MG 24 hr tablet Take 1 tablet every night Patient taking differently: Take 500 mg by mouth daily.  01/06/19  Yes [name], MD  DULoxetine (CYMBALTA) 30 MG capsule Take 1 capsule (30 mg total) by mouth daily. 04/02/19  Yes [name], MD  furosemide (LASIX) 40 MG tablet Take 1 tablet (40 mg total) by mouth daily. 09/23/18  Yes [name], MD  hydrALAZINE (APRESOLINE) 25 MG tablet Take 25 mg by mouth 3 (three) times daily.   Yes [provider]  isosorbide mononitrate (IMDUR) 30 MG 24 hr tablet Take 30 mg by mouth daily.   Yes [provider]  levETIRAcetam (KEPPRA) 1000 MG tablet Take 1 tablet (1,000 mg total) by mouth 2 (two) times daily. 01/06/19  Yes [name], MD  losartan (COZAAR) 25 MG tablet TAKE 1 TABLET BY MOUTH ONCE DAILY 02/26/18  Yes Larey Dresser, MD  phenytoin (DILANTIN) 100 MG ER capsule Take 1 capsule (100 mg total) by mouth 2 (two) times daily. 01/06/19  Yes [name], MD  simvastatin (ZOCOR) 20 MG tablet TAKE 1 TABLET BY MOUTH ONCE DAILY AT 6PM 06/19/19  Yes [name], MD  spironolactone (ALDACTONE) 25 MG tablet TAKE 1 TABLET BY MOUTH ONCE DAILY 02/05/19  Yes [name], MD  tamsulosin [facility]) 0.4 MG CAPS capsule Take 1 capsule by mouth once daily 07/17/19  Yes Marrian Salvage, FNP  acetaminophen (TYLENOL) 325 MG tablet Take 650 mg by mouth every 6 (six) hours as needed for mild pain.    [provider]  aminocaproic  acid (AMICAR) 5 % SOLN Take 10 mLs by mouth every hour as needed (for persistent oozing). 08/05/19   Annita Brod, MD  feeding supplement, ENSURE ENLIVE, (ENSURE ENLIVE) LIQD Take 237 mLs by mouth 3 (three) times daily between meals. 08/05/19   Annita Brod, MD  hydrALAZINE (APRESOLINE) 25 MG tablet Take 1 tablet (25 mg total) by mouth 3 (three) times daily. 01/29/19 04/29/19  Clegg, Amy D, NP  isosorbide mononitrate (IMDUR) 30 MG 24 hr tablet Take 1 tablet (30 mg total) by mouth daily. 01/29/19 04/29/19  Clegg, Amy D, NP  polyethylene glycol (MIRALAX / GLYCOLAX) packet Take 17 g by mouth daily as needed. Patient taking differently: Take 17 g by mouth daily as needed for moderate constipation.  02/25/17   Hosie Poisson, MD  traMADol (ULTRAM) 50 MG tablet Take 1 tablet (50 mg total) by mouth every 6 (six) hours as needed for moderate pain. for pain 08/05/19   Annita Brod, MD  Inpatient Medications: Scheduled Meds:  sodium chloride   Intravenous Once   sodium chloride   Intravenous Once   amiodarone  200 mg Oral Daily   Chlorhexidine Gluconate Cloth  6 each Topical Daily   divalproex  500 mg Oral Daily   feeding supplement (ENSURE ENLIVE)  237 mL Oral TID BM   ferrous sulfate  325 mg Oral BID WC   levETIRAcetam  1,000 mg Oral BID    morphine injection  2 mg Intravenous Once   phenytoin  100 mg Oral BID   phytonadione  5 mg Oral Daily   senna-docusate  1 tablet Oral BID   sodium chloride flush  10-40 mL Intracatheter Q12H   sodium polystyrene  30 g Oral Once   vitamin B-12  100 mcg Oral Daily   Continuous Infusions:  dextrose 45 mL/hr at 08/07/19 1909   PRN Meds: acetaminophen, aminocaproic acid, bisacodyl, labetalol, morphine injection, ondansetron **OR** ondansetron (ZOFRAN) IV, oxyCODONE-acetaminophen, polyethylene glycol, sodium chloride flush, sodium chloride flush  Allergies:   No Known Allergies  Social History:   Social History   Socioeconomic  History   Marital status: Married    Spouse name: Not on file   Number of children: Not on file   Years of education: Not on file   Highest education level: Not on file  Occupational History   Not on file  Social Needs   Financial resource strain: Not on file   Food insecurity    Worry: Not on file    Inability: Not on file   Transportation needs    Medical: Not on file    Non-medical: Not on file  Tobacco Use   Smoking status: Former Smoker    Types: Pipe    Quit date: 09/21/2008    Years since quitting: 10.8   Smokeless tobacco: Former Systems developer    Quit date: 09/21/2008  Substance and Sexual Activity   Alcohol use: No   Drug use: No   Sexual activity: Not Currently    Birth control/protection: None  Lifestyle   Physical activity    Days per week: Not on file    Minutes per session: Not on file   Stress: Not on file  Relationships   Social connections    Talks on phone: Not on file    Gets together: Not on file    Attends religious service: Not on file    Active member of club or organization: Not on file    Attends meetings of clubs or organizations: Not on file    Relationship status: Not on file   Intimate partner violence    Fear of current or ex partner: Not on file    Emotionally abused: Not on file    Physically abused: Not on file    Forced sexual activity: Not on file  Other Topics Concern   Not on file  Social History Narrative   ** Merged History Encounter **        Family History:    Family History  Problem Relation Age of Onset   Arthritis Mother    Heart disease Mother    Heart attack Mother    Other Father        smoker   Hypertension Neg Hx        unknown   Stroke Neg Hx        unknown     ROS:  Please see the history of present illness.   All other ROS reviewed  and negative.     Physical Exam/Data:   Vitals:   08/08/19 1145 08/08/19 1148 08/08/19 1214 08/08/19 1232  BP: (!) 79/59 94/64 (!) 71/48 (!) 71/46   Pulse: 77 91 74 (!) 41  Resp: 16 16 (!) 22 (!) 0  Temp:  98.6 F (37 C)    TempSrc:      SpO2: 100% 100% 100% 100%  Weight:      Height:        Intake/Output Summary (Last 24 hours) at 08/08/2019 1330 Last data filed at 08/08/2019 1145 Gross per 24 hour  Intake 2208.96 ml  Output 3125 ml  Net -916.04 ml   Last 3 Weights 08/06/2019 08/03/2019 08/02/2019  Weight (lbs) 211 lb 3.2 oz 213 lb 3 oz 215 lb 6.2 oz  Weight (kg) 95.8 kg 96.7 kg 97.7 kg     Body mass index is 29.47 kg/m.  General:  Well nourished, well developed, in no acute distress biting down on a gauze pad HEENT: normal except for gingival bleeding Lymph: no adenopathy Neck: no JVD Endocrine:  No thryomegaly Vascular: No carotid bruits; FA pulses 2+ bilaterally without bruits  Cardiac:  normal S1, S2; RRR; no murmur  Lungs:  clear to auscultation bilaterally, no wheezing, rhonchi or rales  Abd: soft, nontender, no hepatomegaly  Ext: no edema Musculoskeletal:  No deformities, BUE and BLE strength normal and equal Skin: warm and dry  Neuro:  CNs 2-12 intact, no focal abnormalities noted Psych:  Normal affect   EKG:  The EKG was personally reviewed and demonstrates:  NSR Telemetry:  Telemetry was personally reviewed and demonstrates:  nsr with occaisional ventricular undersensing  Relevant CV Studies: none  Laboratory Data:  High Sensitivity Troponin:  No results for input(s): TROPONINIHS in the last 720 hours.   Cardiac EnzymesNo results for input(s): TROPONINI in the last 168 hours. No results for input(s): TROPIPOC in the last 168 hours.  Chemistry Recent Labs  Lab 08/06/19 0956 08/07/19 0546 08/07/19 1303 08/07/19 1615 08/08/19 0545  NA 137 135  --   --  135  K 5.5* 5.5*  --   --  4.8  CL 111 108  --   --  108  CO2 18* 22  --   --  24  GLUCOSE 123* 79 60* 95 89  BUN 10 12  --   --  12  CREATININE 1.14 1.01  --   --  1.06  CALCIUM 7.8* 7.7*  --   --  7.8*  GFRNONAA >60 >60  --   --  >60  GFRAA >60  >60  --   --  >60  ANIONGAP 8 5  --   --  3*    Recent Labs  Lab 08/06/19 0956  PROT 6.9  ALBUMIN 3.0*  AST 31  ALT 14  ALKPHOS 81  BILITOT 0.6   Hematology Recent Labs  Lab 08/07/19 1612 08/08/19 0002 08/08/19 0545  WBC 5.9 5.3 5.2  RBC 3.33* 3.06* 3.04*  HGB 9.2* 8.9* 8.7*  HCT 31.3* 28.6* 28.4*  MCV 94.0 93.5 93.4  MCH 27.6 29.1 28.6  MCHC 29.4* 31.1 30.6  RDW 15.4 15.2 15.2  PLT 270 226 213   BNPNo results for input(s): BNP, PROBNP in the last 168 hours.  DDimer No results for input(s): DDIMER in the last 168 hours.   Radiology/Studies:  [place] Ekg Site Rite  Result Date: 08/07/2019 If [facility] image not attached, placement could not be confirmed due to current  cardiac rhythm.  [place] Ekg Site Rite  Result Date: 08/07/2019 If Site Rite image not attached, placement could not be confirmed due to current cardiac rhythm.  [place] Ekg Site Rite  Result Date: 08/07/2019 If Site Rite image not attached, placement could not be confirmed due to current cardiac rhythm.   Assessment and Plan:   1. Possible ventricular undersensing - I have reviewed tele and it looks like he has had some undersensing. I will have his device formally interogated though this will be on Monday unless he develops complications. 2. VT/VF - he has not had any recurrent ventricular arrhythmias since his last ICD check 3. ICD - he is at [facility]. He was pending ICD generator change out. This will be put on hold for now. 4. Chronic systolic heart failure - his last echo 2 years ago demonstrated improved LV function. If he has more hypotension, I would repeat while in hospital.   Carleene Overlie Siena Poehler,M.D.  For questions or updates, please contact [place] Please consult [email or web address] for contact info under   Signed, [name], MD  08/08/2019 1:30 PM

## 2019-08-08 NOTE — Progress Notes (Addendum)
PROGRESS NOTE    [facility].  EXB:[id number] DOB: [birth year] DOA: 07/20/2019 PCP: [name], MD   Brief Narrative: 73 year old with past medical history significant for cardiovascular disease, chronic systolic heart failure ejection fraction 25%, status post AICD, chronic A. 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 Patient presented to the hospital on 7/27 with lightheadedness and dizziness after having tooth removed, bleeding from the mouth and unable to eat properly.  Per the patient he has not been on any blood thinner since he had retroperitoneal bleed about 2 months prior to admission.  Patient is able to walk with a walker.  Also has a history of seizure and is on multiple medications. Patient had gone to the dentist and had almost all his teeth removed on 07/08/2019, following that he had continued bleeding into his mouth and he had talk to their dentist who recommended to control with gauze piece.  Patient has had poor oral intake.   Since hospitalization, patient has required occasional transfusion as well as FFP.  Also due to poor oral intake, has episode of hypoglycemia requiring D5 drip.  The morning 8/11 patient had another episode of oral bleeding through several pads, although hemoglobin has remained stable.  Patient's second COVID test was also negative.  Assessment & Plan:   Principal Problem:   Anemia due to blood loss, acute Active Problems:   HTN (hypertension)   Coronary artery disease involving native heart   Automatic implantable cardioverter-defibrillator in situ   Seizure (HCC)   Renal insufficiency   Factor VII deficiency (HCC)   Chronic systolic CHF (congestive heart failure), NYHA class 2 (HCC)   Chronic kidney disease (CKD), stage III (moderate) (HCC)   Oral bleeding   Coagulopathy ([place])   Hospice care patient   Palliative care patient  Addendum; Hypotension;  Patient became  agitated after surgery in PACU. Was transiently on precex gtt. Subsequently became hypotensive SBP in the 70. Anesthesiology ordered IV bolus. SBP in the high 80. Will ask CCM to evaluate.  Patient also with spike after QRS, unclear if defibrillator is censing correctly. Cardiology has been consulted to evaluate rhythm.    Anemia due to acute blood loss, symptomatic anemia: post operative dental bleeding.  Patient has required blood transfusion during this admission due to dropping hemoglobin. She has also required FFP and vitamin K. Dr. [name] oral surgeon did suture of incision on 8/2 He has a history of factor VII deficiency/ ?, hematology was consulted.  Work-up has been sent out. Need vitamin K for 10 to 14 days. Per hematology is some concern for possible C PVC of liver and possible related coagulopathy is recommending vitamin K. Patient also received Amicar Dr [name] evaluated patient 8/13 patient might need further intervention if continue to bleed.  S/P 1 unit PRBC 8-14. Plan for OR today for resuture of oral incision.   Hypoglycemia: Secondary to poor oral intake ? . Cortisol level normal at 10.  TSH mildly elevated. CBG continue to be low, on D IV fluids, he ate breakfast and drinking ensure and cbg still 50 on D 10.  Received Glucagon.  cbg was 16 and patient is alert, conversant, we are thinking finger stick is not accurate. Trying to get venous sample.  CBG at 60, yesterday, on venous gas, not as low like on capilary glucose.   cbg drop to 58 but on capilary glucose. , Insulin 17, c peptide 7.5, await endocrinology evaluation.  He  is still on D 10.   Constipation:  Schedule Senokot. Dulcolax PRN  Mild hyperkalemia: Resolved after 2 doses of lokelma.   Dizziness lightheadedness: Likely related to blood loss.  Chronic congestive heart failure status post AICD. Due to soft blood pressure, Aldactone, Coreg, nitrates and hydralazine has been on hold.  Chronic A. fib:  Continue with amiodarone. PRN labetalol.   History of seizure disorder: Continue with Depakote, Keppra , phenytoin.  Dementia without behavioral disturbance: Stable  Deconditioning: He needs  PT rehab.   Nutrition Problem: Inadequate oral intake Etiology: acute illness, mouth pain    Signs/Symptoms: per patient/family report    Interventions: Ensure Enlive (each supplement provides 350kcal and 20 grams of protein), MVI  Estimated body mass index is 29.47 kg/m as calculated from the following:   Height as of this encounter: 5' 10.98" (1.803 m).   Weight as of this encounter: 95.8 kg.   DVT prophylaxis: scd  Code Status: full code Family Communication: wife over phone Disposition Plan: SNF when CBG stable.  Consultants:   Dr [name]  Hematology   Procedures:   8/2 -alveoplasty with re-suturing of oral wound   Antimicrobials:  none  Subjective: He is feeling well this am. He denies chest pain or dyspnea. Asking if he will be able to eat after sx.   Objective: Vitals:   08/08/19 0336 08/08/19 0400 08/08/19 0500 08/08/19 0600  BP:  (!) 87/57 92/63 95/68   Pulse:  (!) 113 (!) 120 (!) 120  Resp:  (!) 23 19 11   Temp: 99.5 F (37.5 C)     TempSrc: Axillary     SpO2:  99% 100% 100%  Weight:      Height:        Intake/Output Summary (Last 24 hours) at 08/08/2019 0713 Last data filed at 08/08/2019 0600 Gross per 24 hour  Intake 1543.96 ml  Output 3580 ml  Net -2036.04 ml   Filed Weights   08/02/19 0641 08/03/19 0632 08/06/19 0432  Weight: 97.7 kg 96.7 kg 95.8 kg    Examination:  General exam: NAD Respiratory system: CTA Cardiovascular system: S 1, S 2 RRR Gastrointestinal system: BS present, soft, nt Central nervous system: Non focal.  Extremities; Symmetric power  Skin: no rashes.    Data Reviewed: I have personally reviewed following labs and imaging studies  CBC: Recent Labs  Lab 08/02/19 0328 08/03/19 0424  08/06/19 0956 08/07/19 0546  08/07/19 1612 08/08/19 0002 08/08/19 0545  WBC 5.9 5.7   < > 4.4 4.7 5.9 5.3 5.2  NEUTROABS 3.5 3.2  --   --   --  3.7 2.8  --   HGB 8.6* 8.7*   < > 8.2* 7.8* 9.2* 8.9* 8.7*  HCT 28.4* 28.8*   < > 28.2* 27.1* 31.3* 28.6* 28.4*  MCV 93.7 93.5   < > 96.6 95.4 94.0 93.5 93.4  PLT 304 292   < > 223 262 270 226 213   < > = values in this interval not displayed.   Basic Metabolic Panel: Recent Labs  Lab 08/02/19 0328 08/03/19 0424 08/05/19 0549 08/06/19 0956 08/07/19 0546 08/07/19 1303 08/07/19 1615  NA 136 135 136 137 135  --   --   K 4.8 5.3* 5.1 5.5* 5.5*  --   --   CL 108 107 107 111 108  --   --   CO2 25 24 25  18* 22  --   --   GLUCOSE 120* 80 110* 123* 79 60* 95  BUN 10 10 13 10 12   --   --   CREATININE 1.14 1.04 1.20 1.14 1.01  --   --   CALCIUM 7.9* 7.8* 7.9* 7.8* 7.7*  --   --    GFR: Estimated Creatinine Clearance: 80.4 mL/min (by C-G formula based on SCr of 1.01 mg/dL). Liver Function Tests: Recent Labs  Lab 08/06/19 0956  AST 31  ALT 14  ALKPHOS 81  BILITOT 0.6  PROT 6.9  ALBUMIN 3.0*   No results for input(s): LIPASE, AMYLASE in the last 168 hours. No results for input(s): AMMONIA in the last 168 hours. Coagulation Profile: No results for input(s): INR, PROTIME in the last 168 hours. Cardiac Enzymes: No results for input(s): CKTOTAL, CKMB, CKMBINDEX, TROPONINI in the last 168 hours. BNP (last 3 results) Recent Labs    11/26/18 1025  PROBNP 56.0   HbA1C: No results for input(s): HGBA1C in the last 72 hours. CBG: Recent Labs  Lab 08/07/19 1143 08/07/19 1225 08/07/19 1743 08/07/19 1832 08/07/19 2123  GLUCAP 45* 16* 58* 78 82   Lipid Profile: No results for input(s): CHOL, HDL, LDLCALC, TRIG, CHOLHDL, LDLDIRECT in the last 72 hours. Thyroid Function Tests: Recent Labs    08/06/19 0956  TSH 5.695*   Anemia Panel: No results for input(s): VITAMINB12, FOLATE, FERRITIN, TIBC, IRON, RETICCTPCT in the last 72 hours. Sepsis Labs: No results  for input(s): PROCALCITON, LATICACIDVEN in the last 168 hours.  Recent Results (from the past 240 hour(s))  SARS CORONAVIRUS 2 Nasal Swab Aptima Multi Swab     Status: None   Collection Time: 08/04/19  9:52 AM   Specimen: Aptima Multi Swab; Nasal Swab  Result Value Ref Range Status   SARS Coronavirus 2 NEGATIVE NEGATIVE Final    Comment: (NOTE) SARS-CoV-2 target nucleic acids are NOT DETECTED. The SARS-CoV-2 RNA is generally detectable in upper and lower respiratory specimens during the acute phase of infection. Negative results do not preclude SARS-CoV-2 infection, do not rule out co-infections with other pathogens, and should not be used as the sole basis for treatment or other patient management decisions. Negative results must be combined with clinical observations, patient history, and epidemiological information. The expected result is Negative. Fact Sheet for Patients: SugarRoll.be Fact Sheet for Healthcare Providers: https://www.woods-mathews.com/ This test is not yet approved or cleared by the [place] FDA and  has been authorized for detection and/or diagnosis of SARS-CoV-2 by FDA under an Emergency Use Authorization (EUA). This EUA will remain  in effect (meaning this test can be used) for the duration of the COVID-19 declaration under Section 56 4(b)(1) of the Act, 21 U.S.C. section 360bbb-3(b)(1), unless the authorization is terminated or revoked sooner. Performed at [facility], [place] [address], [place], [place] [address]          Radiology Studies: [place] Ekg Site Rite  Result Date: 08/07/2019 If [facility] image not attached, placement could not be confirmed due to current cardiac rhythm.  [place] Ekg Site Rite  Result Date: 08/07/2019 If Site Rite image not attached, placement could not be confirmed due to current cardiac rhythm.  [place] Ekg Site Rite  Result Date: 08/07/2019 If Site Rite image not attached,  placement could not be confirmed due to current cardiac rhythm.       Scheduled Meds: . sodium chloride   Intravenous Once  . sodium chloride   Intravenous Once  . amiodarone  200 mg Oral Daily  . Chlorhexidine Gluconate Cloth  6 each Topical  Daily  . divalproex  500 mg Oral Daily  . feeding supplement (ENSURE ENLIVE)  237 mL Oral TID BM  . ferrous sulfate  325 mg Oral BID WC  . levETIRAcetam  1,000 mg Oral BID  .  morphine injection  2 mg Intravenous Once  . phenytoin  100 mg Oral BID  . phytonadione  5 mg Oral Daily  . senna-docusate  1 tablet Oral BID  . sodium chloride flush  10-40 mL Intracatheter Q12H  . sodium polystyrene  30 g Oral Once  . vitamin B-12  100 mcg Oral Daily   Continuous Infusions: .  ceFAZolin (ANCEF) IV    . dextrose 45 mL/hr at 08/07/19 1909     LOS: 18 days    Time spent: 35 minutes.     Elmarie Shiley, MD [facility] Pager [phone]  If 7PM-7AM, please contact night-coverage [email or web address] Password [facility] 08/08/2019, 7:13 AM

## 2019-08-08 NOTE — Transfer of Care (Signed)
Immediate Anesthesia Transfer of Care Note  Patient: [facility].  Procedure(s) Performed: Procedure(s): suture of oral wounds (N/A)  Patient Location: PACU  Anesthesia Type:General  Level of Consciousness:  sedated, patient cooperative and responds to stimulation  Airway & Oxygen Therapy:Patient Spontanous Breathing and Patient connected to face mask oxgen  Post-op Assessment:  Report given to PACU RN and Post -op Vital signs reviewed and stable  Post vital signs:  Reviewed and stable  Last Vitals:  Vitals:   08/08/19 0800 08/08/19 0900  BP:  99/74  Pulse:  (!) 59  Resp:  (!) 8  Temp: 37 C   SpO2:  393%    Complications: No apparent anesthesia complications

## 2019-08-08 NOTE — Progress Notes (Signed)
Upon initial arrival to PACU from OR, was combative and requiring assistance from 4 personnel to manage. Dr. Marcell Barlow in PACU and ordered Precedex bolus and drip which was initiated by [facility], CRNA.

## 2019-08-08 NOTE — Brief Op Note (Signed)
08/08/2019  10:35 AM  PATIENT:  Louis Ford.  73 y.o. male  PRE-OPERATIVE DIAGNOSIS: Persistent intermittent oral bleeding; coagulopathy  POST-OPERATIVE DIAGNOSIS:  SAME  PROCEDURE:  Procedure(s): suture of oral wounds  SURGEON:  Surgeon(s): [name], [name]  ANESTHESIA:   local and general  EBL:  minimal  DRAINS: none   SPECIMEN:  No Specimen  COUNTS:  YES  PLAN OF CARE: Discharge to home after PACU  PATIENT DISPOSITION:  Return to ICU   PROCEDURE DETAILS: Dictation #[id number]  [name], DMD 08/08/2019 10:35 AM

## 2019-08-09 ENCOUNTER — Encounter (HOSPITAL_COMMUNITY): Payer: Self-pay | Admitting: Oral Surgery

## 2019-08-09 LAB — BASIC METABOLIC PANEL
Anion gap: 6 (ref 5–15)
BUN: 10 mg/dL (ref 8–23)
CO2: 23 mmol/L (ref 22–32)
Calcium: 8 mg/dL — ABNORMAL LOW (ref 8.9–10.3)
Chloride: 107 mmol/L (ref 98–111)
Creatinine, Ser: 0.9 mg/dL (ref 0.61–1.24)
GFR calc Af Amer: >60 mL/min (ref >60)
GFR calc non Af Amer: >60 mL/min (ref >60)
Glucose, Bld: 92 mg/dL (ref 70–99)
Potassium: 4.5 mmol/L (ref 3.5–5.1)
Sodium: 136 mmol/L (ref 135–145)

## 2019-08-09 LAB — CBC
HCT: 28.6 % — ABNORMAL LOW (ref 39.0–52.0)
Hemoglobin: 8.6 g/dL — ABNORMAL LOW (ref 13.0–17.0)
MCH: 28.6 pg (ref 26.0–34.0)
MCHC: 30.1 g/dL (ref 30.0–36.0)
MCV: 95 fL (ref 80.0–100.0)
Platelets: 201 10*3/uL (ref 150–400)
RBC: 3.01 MIL/uL — ABNORMAL LOW (ref 4.22–5.81)
RDW: 15.5 % (ref 11.5–15.5)
WBC: 5.3 10*3/uL (ref 4.0–10.5)
nRBC: 0.4 % — ABNORMAL HIGH (ref 0.0–0.2)

## 2019-08-09 LAB — GLUCOSE, CAPILLARY
Glucose-Capillary: 66 mg/dL — ABNORMAL LOW (ref 70–99)
Glucose-Capillary: 50 mg/dL — ABNORMAL LOW (ref 70–99)
Glucose-Capillary: 60 mg/dL — ABNORMAL LOW (ref 70–99)
Glucose-Capillary: 102 mg/dL — ABNORMAL HIGH (ref 70–99)
Glucose-Capillary: 95 mg/dL (ref 70–99)
Glucose-Capillary: 65 mg/dL — ABNORMAL LOW (ref 70–99)
Glucose-Capillary: 96 mg/dL (ref 70–99)
Glucose-Capillary: 69 mg/dL — ABNORMAL LOW (ref 70–99)
Glucose-Capillary: 81 mg/dL (ref 70–99)

## 2019-08-09 MED ORDER — SODIUM CHLORIDE 0.9 % IV SOLN
INTRAVENOUS | Status: DC
  Administered 2019-08-09: 09:00:00 via INTRAVENOUS

## 2019-08-09 NOTE — Progress Notes (Signed)
PROGRESS NOTE    [facility].  JJK:[id number] DOB: 01/26/1946 DOA: 07/20/2019 PCP: [name], MD   Brief Narrative: 73 year old with past medical history significant for cardiovascular disease, chronic systolic heart failure ejection fraction 25%, status post AICD, chronic A. 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 Patient presented to the hospital on 7/27 with lightheadedness and dizziness after having tooth removed, bleeding from the mouth and unable to eat properly.  Per the patient he has not been on any blood thinner since he had retroperitoneal bleed about 2 months prior to admission.  Patient is able to walk with a walker.  Also has a history of seizure and is on multiple medications. Patient had gone to the dentist and had almost all his teeth removed on 07/08/2019, following that he had continued bleeding into his mouth and he had talk to their dentist who recommended to control with gauze piece.  Patient has had poor oral intake.   Since hospitalization, patient has required occasional transfusion as well as FFP.  Also due to poor oral intake, has episode of hypoglycemia requiring D5 drip.  The morning 8/11 patient had another episode of oral bleeding through several pads, although hemoglobin has remained stable.  Patient's second COVID test was also negative.  Assessment & Plan:   Principal Problem:   Anemia due to blood loss, acute Active Problems:   HTN (hypertension)   Coronary artery disease involving native heart   Automatic implantable cardioverter-defibrillator in situ   Seizure (HCC)   Renal insufficiency   Factor VII deficiency (HCC)   Chronic systolic CHF (congestive heart failure), NYHA class 2 (HCC)   Chronic kidney disease (CKD), stage III (moderate) (HCC)   Oral bleeding   Coagulopathy ([place])   Hospice care patient   Palliative care patient    Anemia due to acute blood loss, symptomatic  anemia: post operative dental bleeding.  Patient has required blood transfusion during this admission due to dropping hemoglobin. She has also required FFP and vitamin K. Dr. [name] oral surgeon did suture of incision on 8/2 He has a history of factor VII deficiency/ ?, hematology was consulted.  Work-up has been sent out. Need vitamin K for 10 to 14 days. Per hematology is some concern for possible C PVC of liver and possible related coagulopathy is recommending vitamin K. Patient also received Amicar Dr [name] evaluated patient 8/13 patient might need further intervention if continue to bleed.  S/P 1 unit PRBC 8-14. Plan for OR today for resuture of oral incision.   Hypoglycemia: Secondary to poor oral intake ? . Cortisol level normal at 10.  TSH mildly elevated. capillary glucose not accurate.  CBG capillary at 60 serum 100/.  Will discontinue IV fD 10, will check CBG from venous sample.  Appreciate Dr Buddy Duty help.   Hypotension; post surgery likely related to precedex. Patient became agitated after surgery in PACU. Was transiently on precex gtt. Subsequently became hypotensive SBP in the 70. Anesthesiology ordered IV bolus. SBP in the high 80.  Patient also with spike after QRS, unclear if defibrillator is censing correctly. Cardiology has been consulted to evaluate rhythm.   BP improved.   Possible Ventricular Undersensing;  [facility] evaluation.  Plan to interrogate  defibrillator on Monday   Constipation:  Schedule Senokot. Dulcolax PRN  Mild hyperkalemia: Resolved after 2 doses of lokelma.   Dizziness lightheadedness: Likely related to blood loss.  Chronic congestive heart failure status post AICD.  Due to soft blood pressure, Aldactone, Coreg, nitrates and hydralazine has been on hold.  Chronic A. fib: Continue with amiodarone. PRN labetalol.   History of seizure disorder: Continue with Depakote, Keppra , phenytoin.  Dementia without behavioral  disturbance: Stable  Deconditioning: He needs  PT rehab.   Nutrition Problem: Inadequate oral intake Etiology: acute illness, mouth pain    Signs/Symptoms: per patient/family report    Interventions: Ensure Enlive (each supplement provides 350kcal and 20 grams of protein), MVI  Estimated body mass index is 29.47 kg/m as calculated from the following:   Height as of this encounter: 5' 10.98" (1.803 m).   Weight as of this encounter: 95.8 kg.   DVT prophylaxis: scd  Code Status: full code Family Communication: wife over phone Disposition Plan: SNF when CBG stable.  Consultants:   Dr [name]  Hematology   Procedures:   8/2 -alveoplasty with re-suturing of oral wound   Antimicrobials:  none  Subjective: He is alert, feeling well, no significant bleeding from gum   Objective: Vitals:   08/08/19 2327 08/09/19 0000 08/09/19 0315 08/09/19 0400  BP:    109/79  Pulse:  98  81  Resp:  19  (!) 23  Temp: 98.5 F (36.9 C)  98.2 F (36.8 C)   TempSrc: Axillary  Axillary   SpO2:  92%  100%  Weight:      Height:        Intake/Output Summary (Last 24 hours) at 08/09/2019 9147 Last data filed at 08/09/2019 8295 Gross per 24 hour  Intake 665 ml  Output 2275 ml  Net -1610 ml   Filed Weights   08/02/19 0641 08/03/19 0632 08/06/19 0432  Weight: 97.7 kg 96.7 kg 95.8 kg    Examination:  General exam: NAD Respiratory system: CTA Cardiovascular system:  S1, S 2 RRR Gastrointestinal system: BS present, soft, nt Central nervous system: non focal.  Extremities; symmetric power.  Skin: no rashes.    Data Reviewed: I have personally reviewed following labs and imaging studies  CBC: Recent Labs  Lab 08/03/19 0424  08/07/19 0546 08/07/19 1612 08/08/19 0002 08/08/19 0545 08/08/19 1700 08/09/19 0436  WBC 5.7   < > 4.7 5.9 5.3 5.2  --  5.3  NEUTROABS 3.2  --   --  3.7 2.8  --   --   --   HGB 8.7*   < > 7.8* 9.2* 8.9* 8.7* 9.3* 8.6*  HCT 28.8*   < > 27.1*  31.3* 28.6* 28.4* 30.1* 28.6*  MCV 93.5   < > 95.4 94.0 93.5 93.4  --  95.0  PLT 292   < > 262 270 226 213  --  201   < > = values in this interval not displayed.   Basic Metabolic Panel: Recent Labs  Lab 08/05/19 0549 08/06/19 0956 08/07/19 0546 08/07/19 1303 08/07/19 1615 08/08/19 0545 08/08/19 1700 08/09/19 0436  NA 136 137 135  --   --  135  --  136  K 5.1 5.5* 5.5*  --   --  4.8  --  4.5  CL 107 111 108  --   --  108  --  107  CO2 25 18* 22  --   --  24  --  23  GLUCOSE 110* 123* 79 60* 95 89  --  92  BUN 13 10 12   --   --  12  --  10  CREATININE 1.20 1.14 1.01  --   --  1.06  --  0.90  CALCIUM 7.9* 7.8* 7.7*  --   --  7.8*  --  8.0*  MG  --   --   --   --   --   --  1.9  --    GFR: Estimated Creatinine Clearance: 90.2 mL/min (by C-G formula based on SCr of 0.9 mg/dL). Liver Function Tests: Recent Labs  Lab 08/06/19 0956  AST 31  ALT 14  ALKPHOS 81  BILITOT 0.6  PROT 6.9  ALBUMIN 3.0*   No results for input(s): LIPASE, AMYLASE in the last 168 hours. No results for input(s): AMMONIA in the last 168 hours. Coagulation Profile: No results for input(s): INR, PROTIME in the last 168 hours. Cardiac Enzymes: No results for input(s): CKTOTAL, CKMB, CKMBINDEX, TROPONINI in the last 168 hours. BNP (last 3 results) Recent Labs    11/26/18 1025  PROBNP 56.0   HbA1C: No results for input(s): HGBA1C in the last 72 hours. CBG: Recent Labs  Lab 08/07/19 1832 08/07/19 2123 08/08/19 0746 08/08/19 1122 08/08/19 2139  GLUCAP 78 82 75 104* 78   Lipid Profile: No results for input(s): CHOL, HDL, LDLCALC, TRIG, CHOLHDL, LDLDIRECT in the last 72 hours. Thyroid Function Tests: Recent Labs    08/06/19 0956  TSH 5.695*   Anemia Panel: No results for input(s): VITAMINB12, FOLATE, FERRITIN, TIBC, IRON, RETICCTPCT in the last 72 hours. Sepsis Labs: No results for input(s): PROCALCITON, LATICACIDVEN in the last 168 hours.  Recent Results (from the past 240 hour(s))   SARS CORONAVIRUS 2 Nasal Swab Aptima Multi Swab     Status: None   Collection Time: 08/04/19  9:52 AM   Specimen: Aptima Multi Swab; Nasal Swab  Result Value Ref Range Status   SARS Coronavirus 2 NEGATIVE NEGATIVE Final    Comment: (NOTE) SARS-CoV-2 target nucleic acids are NOT DETECTED. The SARS-CoV-2 RNA is generally detectable in upper and lower respiratory specimens during the acute phase of infection. Negative results do not preclude SARS-CoV-2 infection, do not rule out co-infections with other pathogens, and should not be used as the sole basis for treatment or other patient management decisions. Negative results must be combined with clinical observations, patient history, and epidemiological information. The expected result is Negative. Fact Sheet for Patients: SugarRoll.be Fact Sheet for Healthcare Providers: https://www.woods-mathews.com/ This test is not yet approved or cleared by the [place] FDA and  has been authorized for detection and/or diagnosis of SARS-CoV-2 by FDA under an Emergency Use Authorization (EUA). This EUA will remain  in effect (meaning this test can be used) for the duration of the COVID-19 declaration under Section 56 4(b)(1) of the Act, 21 U.S.C. section 360bbb-3(b)(1), unless the authorization is terminated or revoked sooner. Performed at [facility], [place] [address] [place], [place] [address]   MRSA PCR Screening     Status: None   Collection Time: 08/08/19  7:01 AM   Specimen: Nasal Mucosa; Nasopharyngeal  Result Value Ref Range Status   MRSA by PCR NEGATIVE NEGATIVE Final    Comment:        The GeneXpert MRSA Assay (FDA approved for NASAL specimens only), is one component of a comprehensive MRSA colonization surveillance program. It is not intended to diagnose MRSA infection nor to guide or monitor treatment for MRSA infections. Performed at [facility], [place]  [address], [place], [place] [address]          Radiology Studies: [place] [facility]  Result Date:  08/07/2019 If Site Rite image not attached, placement could not be confirmed due to current cardiac rhythm.  [place] Ekg Site Rite  Result Date: 08/07/2019 If Site Rite image not attached, placement could not be confirmed due to current cardiac rhythm.  [place] Ekg Site Rite  Result Date: 08/07/2019 If Site Rite image not attached, placement could not be confirmed due to current cardiac rhythm.       Scheduled Meds: . sodium chloride   Intravenous Once  . sodium chloride   Intravenous Once  . amiodarone  200 mg Oral Daily  . Chlorhexidine Gluconate Cloth  6 each Topical Daily  . divalproex  500 mg Oral Daily  . feeding supplement (ENSURE ENLIVE)  237 mL Oral TID BM  . ferrous sulfate  325 mg Oral BID WC  . levETIRAcetam  1,000 mg Oral BID  .  morphine injection  2 mg Intravenous Once  . phenytoin  100 mg Oral BID  . phytonadione  5 mg Oral Daily  . senna-docusate  1 tablet Oral BID  . sodium chloride flush  10-40 mL Intracatheter Q12H  . sodium polystyrene  30 g Oral Once  . vitamin B-12  100 mcg Oral Daily   Continuous Infusions: . dextrose 45 mL/hr at 08/07/19 1909     LOS: 19 days    Time spent: 35 minutes.     Elmarie Shiley, MD [facility] Pager [phone]  If 7PM-7AM, please contact night-coverage [email or web address] Password [facility] 08/09/2019, 7:12 AM

## 2019-08-09 NOTE — Progress Notes (Signed)
Hypoglycemic Event  CBG: 60  Treatment: 4oz. Orange juice given  Symptoms: asymptomatic  Follow-up CBG: Time 0827 CBG Result:50  Possible Reasons for Event: Decreased oral intake due to repeat oral surgeries.  Comments/MD notified:RN directed to draw from central line.  CBG from central line 102.    Teodoro Spray

## 2019-08-09 NOTE — Progress Notes (Signed)
Progress Note  Patient Name: Louis Ford. Date of Encounter: 08/09/2019  Primary Cardiologist: No primary care provider on file. Louis Ford  Subjective   Denies chest pain or sob. Bleeding is improved.  Inpatient Medications    Scheduled Meds: . sodium chloride   Intravenous Once  . sodium chloride   Intravenous Once  . amiodarone  200 mg Oral Daily  . Chlorhexidine Gluconate Cloth  6 each Topical Daily  . divalproex  500 mg Oral Daily  . feeding supplement (ENSURE ENLIVE)  237 mL Oral TID BM  . ferrous sulfate  325 mg Oral BID WC  . levETIRAcetam  1,000 mg Oral BID  .  morphine injection  2 mg Intravenous Once  . phenytoin  100 mg Oral BID  . phytonadione  5 mg Oral Daily  . senna-docusate  1 tablet Oral BID  . sodium chloride flush  10-40 mL Intracatheter Q12H  . sodium polystyrene  30 g Oral Once  . vitamin B-12  100 mcg Oral Daily   Continuous Infusions: . dextrose 45 mL/hr at 08/09/19 0700   PRN Meds: acetaminophen, aminocaproic acid, bisacodyl, labetalol, morphine injection, ondansetron **OR** ondansetron (ZOFRAN) IV, oxyCODONE-acetaminophen, polyethylene glycol, sodium chloride flush, sodium chloride flush   Vital Signs    Vitals:   08/09/19 0400 08/09/19 0700 08/09/19 0720 08/09/19 0800  BP: 109/79 110/75  114/87  Pulse: 81   (!) 45  Resp: (!) 23 16  15   Temp:   98 F (36.7 C)   TempSrc:   Axillary   SpO2: 100%   100%  Weight:      Height:        Intake/Output Summary (Last 24 hours) at 08/09/2019 0834 Last data filed at 08/09/2019 8841 Gross per 24 hour  Intake 715.25 ml  Output 2275 ml  Net -1559.75 ml   Filed Weights   08/02/19 0641 08/03/19 0632 08/06/19 0432  Weight: 97.7 kg 96.7 kg 95.8 kg    Telemetry     atrial fib with intermittent undersensing- Personally Reviewed  ECG    none - Personally Reviewed  Physical Exam   GEN: No acute distress.   Neck: No JVD Cardiac: IRRR, no murmurs, rubs, or gallops.  Respiratory: Clear to  auscultation bilaterally. GI: Soft, nontender, non-distended  MS: No edema; No deformity. Neuro:  Nonfocal  Psych: Normal affect   Labs    Chemistry Recent Labs  Lab 08/06/19 0956 08/07/19 0546  08/07/19 1615 08/08/19 0545 08/09/19 0436  NA 137 135  --   --  135 136  K 5.5* 5.5*  --   --  4.8 4.5  CL 111 108  --   --  108 107  CO2 18* 22  --   --  24 23  GLUCOSE 123* 79   < > 95 89 92  BUN 10 12  --   --  12 10  CREATININE 1.14 1.01  --   --  1.06 0.90  CALCIUM 7.8* 7.7*  --   --  7.8* 8.0*  PROT 6.9  --   --   --   --   --   ALBUMIN 3.0*  --   --   --   --   --   AST 31  --   --   --   --   --   ALT 14  --   --   --   --   --   ALKPHOS 81  --   --   --   --   --  BILITOT 0.6  --   --   --   --   --   GFRNONAA >60 >60  --   --  >60 >60  GFRAA >60 >60  --   --  >60 >60  ANIONGAP 8 5  --   --  3* 6   < > = values in this interval not displayed.     Hematology Recent Labs  Lab 08/08/19 0002 08/08/19 0545 08/08/19 1700 08/09/19 0436  WBC 5.3 5.2  --  5.3  RBC 3.06* 3.04*  --  3.01*  HGB 8.9* 8.7* 9.3* 8.6*  HCT 28.6* 28.4* 30.1* 28.6*  MCV 93.5 93.4  --  95.0  MCH 29.1 28.6  --  28.6  MCHC 31.1 30.6  --  30.1  RDW 15.2 15.2  --  15.5  PLT 226 213  --  201    Cardiac EnzymesNo results for input(s): TROPONINI in the last 168 hours. No results for input(s): TROPIPOC in the last 168 hours.   BNPNo results for input(s): BNP, PROBNP in the last 168 hours.   DDimer No results for input(s): DDIMER in the last 168 hours.   Radiology    [place] Ekg Site Rite  Result Date: 08/07/2019 If [facility] image not attached, placement could not be confirmed due to current cardiac rhythm.  [place] Ekg Site Rite  Result Date: 08/07/2019 If Site Rite image not attached, placement could not be confirmed due to current cardiac rhythm.  [place] Ekg Site Rite  Result Date: 08/07/2019 If Site Rite image not attached, placement could not be confirmed due to current cardiac rhythm.    Cardiac Studies   none  Patient Profile     73 y.o. male admitted after oral surgery with on going gum bleeding, s/p surgery  Assessment & Plan    1. Possible ventricular undersensing - I will have his biotronik ICD interrogated tomorrow morning.  2. VT/VF - he has had none on amiodarone 3. ICD - he was at [facility] last month. If we can confirm sensing is satisfactory or can be programmed so, we will allow him to go to rehab prior to ICD generator change out.  4. Chronic systolic heart failure - he appears to be euvolemic today.      For questions or updates, please contact [place] Please consult [email or web address] for contact info under Cardiology/STEMI.      Signed, [name], MD  08/09/2019, 8:34 AM  Patient ID: Louis Dandy., male   DOB: July 19, 1946, 72 y.o.   MRN: [id number]

## 2019-08-09 NOTE — Progress Notes (Signed)
[facility]. PROGRESS NOTE:   SUBJECTIVE: Resting comfortably. Pain 4/10, tolerating po's.   OBJECTIVE:  Vitals: Blood pressure 109/83, pulse (!) 45, temperature 98 F (36.7 C), temperature source Axillary, resp. rate (!) 31, height 5' 10.98" (1.803 m), weight 95.8 kg, SpO2 100 %. Lab results: Results for orders placed or performed during the hospital encounter of 07/20/19 (from the past 24 hour(s))  Glucose, capillary     Status: Abnormal   Collection Time: 08/08/19 11:22 AM  Result Value Ref Range   Glucose-Capillary 104 (H) 70 - 99 mg/dL  Hemoglobin and hematocrit, blood     Status: Abnormal   Collection Time: 08/08/19  5:00 PM  Result Value Ref Range   Hemoglobin 9.3 (L) 13.0 - 17.0 g/dL   HCT 30.1 (L) 39.0 - 52.0 %  Magnesium     Status: None   Collection Time: 08/08/19  5:00 PM  Result Value Ref Range   Magnesium 1.9 1.7 - 2.4 mg/dL  Glucose, capillary     Status: None   Collection Time: 08/08/19  9:39 PM  Result Value Ref Range   Glucose-Capillary 78 70 - 99 mg/dL   Comment 1 Notify RN    Comment 2 Document in Chart   CBC     Status: Abnormal   Collection Time: 08/09/19  4:36 AM  Result Value Ref Range   WBC 5.3 4.0 - 10.5 K/uL   RBC 3.01 (L) 4.22 - 5.81 MIL/uL   Hemoglobin 8.6 (L) 13.0 - 17.0 g/dL   HCT 28.6 (L) 39.0 - 52.0 %   MCV 95.0 80.0 - 100.0 fL   MCH 28.6 26.0 - 34.0 pg   MCHC 30.1 30.0 - 36.0 g/dL   RDW 15.5 11.5 - 15.5 %   Platelets 201 150 - 400 K/uL   nRBC 0.4 (H) 0.0 - 0.2 %  Basic metabolic panel     Status: Abnormal   Collection Time: 08/09/19  4:36 AM  Result Value Ref Range   Sodium 136 135 - 145 mmol/L   Potassium 4.5 3.5 - 5.1 mmol/L   Chloride 107 98 - 111 mmol/L   CO2 23 22 - 32 mmol/L   Glucose, Bld 92 70 - 99 mg/dL   BUN 10 8 - 23 mg/dL   Creatinine, Ser 0.90 0.61 - 1.24 mg/dL   Calcium 8.0 (L) 8.9 - 10.3 mg/dL   GFR calc non Af Amer >60 >60 mL/min   GFR calc Af Amer >60 >60 mL/min   Anion gap 6 5 - 15  Glucose, capillary      Status: Abnormal   Collection Time: 08/09/19  7:58 AM  Result Value Ref Range   Glucose-Capillary 60 (L) 70 - 99 mg/dL   Comment 1 Notify RN    Comment 2 Document in Chart   Glucose, capillary     Status: Abnormal   Collection Time: 08/09/19  8:27 AM  Result Value Ref Range   Glucose-Capillary 50 (L) 70 - 99 mg/dL  Glucose, capillary     Status: Abnormal   Collection Time: 08/09/19  8:32 AM  Result Value Ref Range   Glucose-Capillary 102 (H) 70 - 99 mg/dL   Radiology Results: [place] Ekg Site Rite  Result Date: 08/07/2019 If Site Rite image not attached, placement could not be confirmed due to current cardiac rhythm.  General appearance: alert, cooperative and no distress Head: Normocephalic, without obvious abnormality, atraumatic Eyes: negative Nose: Nares normal. Septum midline. Mucosa normal. No drainage or sinus tenderness.  Throat: sutures intact. submucosal ecchymosis anterior mandible buccal and lingual.  hemostatic Neck: no adenopathy  ASSESSMENT: stable s/p re-suture oral wounds.   PLAN: Continue oral rinses, ice, gauze pressure as needed.    [name] 08/09/2019

## 2019-08-09 NOTE — Progress Notes (Addendum)
Hypoglycemic Event  CBG: 69  Treatment: 4oz orange juice  Symptoms: none  Follow-up CBG: Time:2220 CBG Result:89  Possible Reasons for Event: decreased oral intake due to oral surgeries  Comments/MD notified: [place]    Seline Enzor

## 2019-08-10 DIAGNOSIS — I5022 Chronic systolic (congestive) heart failure: Secondary | ICD-10-CM

## 2019-08-10 DIAGNOSIS — I2510 Atherosclerotic heart disease of native coronary artery without angina pectoris: Secondary | ICD-10-CM

## 2019-08-10 DIAGNOSIS — T829XXA Unspecified complication of cardiac and vascular prosthetic device, implant and graft, initial encounter: Secondary | ICD-10-CM

## 2019-08-10 LAB — CBC
HCT: 28.8 % — ABNORMAL LOW (ref 39.0–52.0)
Hemoglobin: 8.8 g/dL — ABNORMAL LOW (ref 13.0–17.0)
MCH: 29 pg (ref 26.0–34.0)
MCHC: 30.6 g/dL (ref 30.0–36.0)
MCV: 95 fL (ref 80.0–100.0)
Platelets: 203 10*3/uL (ref 150–400)
RBC: 3.03 MIL/uL — ABNORMAL LOW (ref 4.22–5.81)
RDW: 15.9 % — ABNORMAL HIGH (ref 11.5–15.5)
WBC: 5.6 10*3/uL (ref 4.0–10.5)
nRBC: 0.5 % — ABNORMAL HIGH (ref 0.0–0.2)

## 2019-08-10 LAB — BASIC METABOLIC PANEL
Anion gap: 6 (ref 5–15)
BUN: 10 mg/dL (ref 8–23)
CO2: 24 mmol/L (ref 22–32)
Calcium: 7.9 mg/dL — ABNORMAL LOW (ref 8.9–10.3)
Chloride: 107 mmol/L (ref 98–111)
Creatinine, Ser: 0.98 mg/dL (ref 0.61–1.24)
GFR calc Af Amer: >60 mL/min (ref >60)
GFR calc non Af Amer: >60 mL/min (ref >60)
Glucose, Bld: 79 mg/dL (ref 70–99)
Potassium: 4.7 mmol/L (ref 3.5–5.1)
Sodium: 137 mmol/L (ref 135–145)

## 2019-08-10 LAB — GLUCOSE, CAPILLARY
Glucose-Capillary: 65 mg/dL — ABNORMAL LOW (ref 70–99)
Glucose-Capillary: 68 mg/dL — ABNORMAL LOW (ref 70–99)
Glucose-Capillary: 105 mg/dL — ABNORMAL HIGH (ref 70–99)
Glucose-Capillary: 93 mg/dL (ref 70–99)
Glucose-Capillary: 66 mg/dL — ABNORMAL LOW (ref 70–99)
Glucose-Capillary: 57 mg/dL — ABNORMAL LOW (ref 70–99)
Glucose-Capillary: 86 mg/dL (ref 70–99)

## 2019-08-10 NOTE — Progress Notes (Signed)
Nutrition Follow-up  INTERVENTION:   -Continue Ensure Enlive po BID, each supplement provides 350 kcal and 20 grams of protein -Provide Magic cup BID with meals, each supplement provides 290 kcal and 9 grams of protein  NUTRITION DIAGNOSIS:   Inadequate oral intake related to acute illness, mouth pain as evidenced by per patient/family report.  Ongoing.  GOAL:   Patient will meet greater than or equal to 90% of their needs  Progressing.  MONITOR:   PO intake, Supplement acceptance, Diet advancement, Labs, Weight trends  ASSESSMENT:   73 y.o. 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 He presented to the ED d/t lightheadedness and dizziness, bleeding from the mouth, and poor oral intakes. Patient denied being on blood thinners since retroperitoneal bleed ~2 months ago. He had 18 teeth removed by his dentist on 07/08/19 and following that time continued to have oral bleeding.  **RD working remotely**  Patient currently consuming 100% of breakfast this morning which consisted of a bowl of tomato soup, bowl of potato soup and 2 apple juices. Per notes in meal ordering system pt requests to receive this same meal everyday. This meal provides ~450 kcal and 9g protein. Will add Magic Cups as well to lunch and dinner meals. Pt is drinking Ensure supplements, at least 2 daily.   Per weight records, weight is up. Admission weight: 203 lbs. Weight today is 211 lbs.  I/Os: -8.7L since 8/3 UOP 8/16: 1.975L  Medications:Ferrous sulfate tablet BID,  Vitamin K tablet daily, Vitamin B-12 tablet daily   Labs reviewed: CBGs: 68-105  Diet Order:   Diet Order            DIET SOFT Room service appropriate? Yes; Fluid consistency: Thin  Diet effective now              EDUCATION NEEDS:   No  education needs have been identified at this time  Skin:  Skin Assessment: Reviewed RN Assessment  Last BM:  8/17  Height:   Ht Readings from Last 1 Encounters:  07/20/19 5' 10.98" (1.803 m)    Weight:   Wt Readings from Last 1 Encounters:  08/10/19 95.9 kg    Ideal Body Weight:  78.2 kg  BMI:  Body mass index is 29.5 kg/m.  Estimated Nutritional Needs:   Kcal:  1820-2085 kcal  Protein:  80-90 grams  Fluid:  >/= 2 L/day  Clayton Bibles, MS, RD, LDN [place] [facility] Pager: [phone] After Hours Pager: [phone]

## 2019-08-10 NOTE — Progress Notes (Signed)
Physical Therapy Treatment Patient Details Name: [name]. MRN: [id number] DOB: April 13, 1946 Today's Date: 08/10/2019    History of Present Illness Louis Ford. is a 73 y.o. 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  Pt s/p resuturing of oral wounds on 08/08/19.  Pt also with Biotronik ICD with Possible Ventricular Undersensing and cardiology following.    PT Comments    RN okay with pt mobilizing.  Pt agreeable to mobilize and assisted with ambulating in hallway.  HR up to 170 bpm.   Follow Up Recommendations  Home health PT;Supervision/Assistance - 24 hour     Equipment Recommendations  Rolling walker with 5" wheels    Recommendations for Other Services       Precautions / Restrictions Precautions Precautions: Fall Precaution Comments: monitor HR    Mobility  Bed Mobility Overal bed mobility: Needs Assistance Bed Mobility: Supine to Sit;Sit to Supine     Supine to sit: Supervision Sit to supine: Supervision   General bed mobility comments: supervision for lines  Transfers Overall transfer level: Needs assistance Equipment used: Rolling walker (2 wheeled) Transfers: Sit to/from Stand Sit to Stand: Min assist;From elevated surface         General transfer comment: verbal cues for hand placement, assist to rise and steady  Ambulation/Gait Ambulation/Gait assistance: Min guard;+2 safety/equipment Gait Distance (Feet): 120 Feet Assistive device: Rolling walker (2 wheeled) Gait Pattern/deviations: Step-through pattern;Decreased stride  length;Antalgic;Trunk flexed     General Gait Details: pt tends to ambulate with forearms on RW and trunk flexion; HR fluctuating 100-160's bpm during ambulation, cues for rest breaks (RN aware of HR)   Stairs             Wheelchair Mobility    Modified Rankin (Stroke Patients Only)       Balance                                            Cognition Arousal/Alertness: Awake/alert Behavior During Therapy: WFL for tasks assessed/performed Overall Cognitive Status: Within Functional Limits for tasks assessed                                        Exercises      General Comments        Pertinent Vitals/Pain Pain Assessment: No/denies pain    Home Living                      Prior Function            PT Goals (current goals can now be found in the care plan section) Acute Rehab PT Goals PT Goal Formulation: With patient Time For Goal Achievement: 08/24/19 Potential to Achieve Goals: Fair Progress towards PT goals: Progressing toward goals    Frequency    Min 2X/week      PT Plan Current plan remains appropriate    Co-evaluation  AM-PAC PT "6 Clicks" Mobility   Outcome Measure  Help needed turning from your back to your side while in a flat bed without using bedrails?: A Little Help needed moving from lying on your back to sitting on the side of a flat bed without using bedrails?: A Little Help needed moving to and from a bed to a chair (including a wheelchair)?: A Little Help needed standing up from a chair using your arms (e.g., wheelchair or bedside chair)?: A Little Help needed to walk in hospital room?: A Little Help needed climbing 3-5 steps with a railing? : A Lot 6 Click Score: 17    End of Session Equipment Utilized During Treatment: Gait belt Activity Tolerance: Treatment limited secondary to medical complications (Comment)(elevated HR) Patient left: in bed;with call  bell/phone within reach;with chair alarm set Nurse Communication: Mobility status PT Visit Diagnosis: Difficulty in walking, not elsewhere classified (R26.2);Muscle weakness (generalized) (M62.81)     Time: 7322-0254 PT Time Calculation (min) (ACUTE ONLY): 17 min  Charges:  $Gait Training: 8-22 mins                    Carmelia Bake, PT, DPT Acute Rehabilitation Services Office: [phone] Pager: [phone]  Trena Platt 08/10/2019, 3:17 PM

## 2019-08-10 NOTE — Progress Notes (Signed)
PROGRESS NOTE    [facility].  HTD:[id number] DOB: Mar 27, 1946 DOA: 07/20/2019 PCP: [name], MD   Brief Narrative: 73 year old with past medical history significant for cardiovascular disease, chronic systolic heart failure ejection fraction 25%, status post AICD, chronic A. 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 Patient presented to the hospital on 7/27 with lightheadedness and dizziness after having tooth removed, bleeding from the mouth and unable to eat properly.  Per the patient he has not been on any blood thinner since he had retroperitoneal bleed about 2 months prior to admission.  Patient is able to walk with a walker.  Also has a history of seizure and is on multiple medications. Patient had gone to the dentist and had almost all his teeth removed on 07/08/2019, following that he had continued bleeding into his mouth and he had talk to their dentist who recommended to control with gauze piece.  Patient has had poor oral intake.   Since hospitalization, patient has required occasional transfusion as well as FFP.  Also due to poor oral intake, has episode of hypoglycemia requiring D5 drip.  The morning 8/11 patient had another episode of oral bleeding through several pads, although hemoglobin has remained stable.  Patient's second COVID test was also negative.  Assessment & Plan:   Principal Problem:   Anemia due to blood loss, acute Active Problems:   HTN (hypertension)   Coronary artery disease involving native heart   Automatic implantable cardioverter-defibrillator in situ   Seizure (HCC)   Renal insufficiency   Factor VII deficiency (HCC)   Chronic systolic CHF (congestive heart failure), NYHA class 2 (HCC)   Chronic kidney disease (CKD), stage III (moderate) (HCC)   Oral bleeding   Coagulopathy ([place])   Hospice care patient   Palliative care patient    Anemia due to acute blood loss, symptomatic  anemia: post operative dental bleeding.  Patient has required blood transfusion during this admission due to dropping hemoglobin. She has also required FFP and vitamin K. Dr. [name] oral surgeon did suture of incision on 8/2 He has a history of factor VII deficiency/ ?, hematology was consulted.  Work-up has been sent out. Need vitamin K for 10 to 14 days. Per hematology is some concern for possible C PVC of liver and possible related coagulopathy is recommending vitamin K. Patient also received Amicar Dr [name] evaluated patient 8/13 patient might need further intervention if continue to bleed.  S/P 1 unit PRBC 8-14. S/P resuturing of oral wound 8/15. Stable.   Hypoglycemia: Secondary to poor oral intake ? . Cortisol level normal at 10.  TSH mildly elevated. capillary glucose not accurate.  CBG capillary at 60 serum 100/.  Appreciate Dr Buddy Duty help.  Off D 10. CBG above 60. Encourage oral intake.   Hypotension; post surgery likely related to precedex. Patient became agitated after surgery in PACU. Was transiently on precex gtt. Subsequently became hypotensive SBP in the 70. Anesthesiology ordered IV bolus. SBP in the high 80.  Patient also with spike after QRS, unclear if defibrillator is censing correctly. Cardiology has been consulted to evaluate rhythm.   BP improved.   Possible Ventricular Undersensing;  [facility] evaluation.  Plan to interrogate  Defibrillator today   Constipation:  Schedule Senokot. Dulcolax PRN  Mild hyperkalemia: Resolved after 2 doses of lokelma.   Dizziness lightheadedness: Likely related to blood loss. Improved.   Chronic congestive heart failure status post AICD. Due to soft  blood pressure, Aldactone, Coreg, nitrates and hydralazine has been on hold. Cardiology follow up.   Chronic A. fib: Continue with amiodarone. PRN labetalol.   History of seizure disorder: Continue with Depakote, Keppra , phenytoin.  Dementia without  behavioral disturbance: Stable  Deconditioning: He needs  PT rehab.   Nutrition Problem: Inadequate oral intake Etiology: acute illness, mouth pain    Signs/Symptoms: per patient/family report    Interventions: Ensure Enlive (each supplement provides 350kcal and 20 grams of protein), Magic cup, MVI  Estimated body mass index is 29.5 kg/m as calculated from the following:   Height as of this encounter: 5' 10.98" (1.803 m).   Weight as of this encounter: 95.9 kg.   DVT prophylaxis: scd  Code Status: full code Family Communication: wife over phone Disposition Plan: SNF when CBG stable.  Consultants:   Dr [name]  Hematology   Procedures:   8/2 -alveoplasty with re-suturing of oral wound   Antimicrobials:  none  Subjective: No significant bleeding mouth.  No abdominal pain.  Denies dyspnea.   Objective: Vitals:   08/10/19 0800 08/10/19 0900 08/10/19 1000 08/10/19 1100  BP: 107/70 118/64 (!) 91/58 101/63  Pulse:    (!) 106  Resp: (!) 23 (!) 22 (!) 26 17  Temp: 97.8 F (36.6 C)     TempSrc: Axillary     SpO2: 100%   100%  Weight:      Height:        Intake/Output Summary (Last 24 hours) at 08/10/2019 1243 Last data filed at 08/10/2019 1100 Gross per 24 hour  Intake 1658.27 ml  Output 2225 ml  Net -566.73 ml   Filed Weights   08/03/19 0632 08/06/19 0432 08/10/19 0146  Weight: 96.7 kg 95.8 kg 95.9 kg    Examination:  General exam: NAD Respiratory system: CTA Cardiovascular system:  S 1, S 2 RRR Gastrointestinal system: BS present, soft, nt Central nervous system: non focal.  Extremities; Symmetric power.  Skin: no rashes.    Data Reviewed: I have personally reviewed following labs and imaging studies  CBC: Recent Labs  Lab 08/07/19 1612 08/08/19 0002 08/08/19 0545 08/08/19 1700 08/09/19 0436 08/10/19 0415  WBC 5.9 5.3 5.2  --  5.3 5.6  NEUTROABS 3.7 2.8  --   --   --   --   HGB 9.2* 8.9* 8.7* 9.3* 8.6* 8.8*  HCT 31.3* 28.6* 28.4*  30.1* 28.6* 28.8*  MCV 94.0 93.5 93.4  --  95.0 95.0  PLT 270 226 213  --  201 161   Basic Metabolic Panel: Recent Labs  Lab 08/06/19 0956 08/07/19 0546 08/07/19 1303 08/07/19 1615 08/08/19 0545 08/08/19 1700 08/09/19 0436 08/10/19 0415  NA 137 135  --   --  135  --  136 137  K 5.5* 5.5*  --   --  4.8  --  4.5 4.7  CL 111 108  --   --  108  --  107 107  CO2 18* 22  --   --  24  --  23 24  GLUCOSE 123* 79 60* 95 89  --  92 79  BUN 10 12  --   --  12  --  10 10  CREATININE 1.14 1.01  --   --  1.06  --  0.90 0.98  CALCIUM 7.8* 7.7*  --   --  7.8*  --  8.0* 7.9*  MG  --   --   --   --   --  1.9  --   --    GFR: Estimated Creatinine Clearance: 82.8 mL/min (by C-G formula based on SCr of 0.98 mg/dL). Liver Function Tests: Recent Labs  Lab 08/06/19 0956  AST 31  ALT 14  ALKPHOS 81  BILITOT 0.6  PROT 6.9  ALBUMIN 3.0*   No results for input(s): LIPASE, AMYLASE in the last 168 hours. No results for input(s): AMMONIA in the last 168 hours. Coagulation Profile: No results for input(s): INR, PROTIME in the last 168 hours. Cardiac Enzymes: No results for input(s): CKTOTAL, CKMB, CKMBINDEX, TROPONINI in the last 168 hours. BNP (last 3 results) Recent Labs    11/26/18 1025  PROBNP 56.0   HbA1C: No results for input(s): HGBA1C in the last 72 hours. CBG: Recent Labs  Lab 08/09/19 2155 08/09/19 2231 08/10/19 0718 08/10/19 0801 08/10/19 1109  GLUCAP 69* 81 65* 68* 105*   Lipid Profile: No results for input(s): CHOL, HDL, LDLCALC, TRIG, CHOLHDL, LDLDIRECT in the last 72 hours. Thyroid Function Tests: No results for input(s): TSH, T4TOTAL, FREET4, T3FREE, THYROIDAB in the last 72 hours. Anemia Panel: No results for input(s): VITAMINB12, FOLATE, FERRITIN, TIBC, IRON, RETICCTPCT in the last 72 hours. Sepsis Labs: No results for input(s): PROCALCITON, LATICACIDVEN in the last 168 hours.  Recent Results (from the past 240 hour(s))  SARS CORONAVIRUS 2 Nasal Swab Aptima  Multi Swab     Status: None   Collection Time: 08/04/19  9:52 AM   Specimen: Aptima Multi Swab; Nasal Swab  Result Value Ref Range Status   SARS Coronavirus 2 NEGATIVE NEGATIVE Final    Comment: (NOTE) SARS-CoV-2 target nucleic acids are NOT DETECTED. The SARS-CoV-2 RNA is generally detectable in upper and lower respiratory specimens during the acute phase of infection. Negative results do not preclude SARS-CoV-2 infection, do not rule out co-infections with other pathogens, and should not be used as the sole basis for treatment or other patient management decisions. Negative results must be combined with clinical observations, patient history, and epidemiological information. The expected result is Negative. Fact Sheet for Patients: SugarRoll.be Fact Sheet for Healthcare Providers: https://www.woods-mathews.com/ This test is not yet approved or cleared by the [place] FDA and  has been authorized for detection and/or diagnosis of SARS-CoV-2 by FDA under an Emergency Use Authorization (EUA). This EUA will remain  in effect (meaning this test can be used) for the duration of the COVID-19 declaration under Section 56 4(b)(1) of the Act, 21 U.S.C. section 360bbb-3(b)(1), unless the authorization is terminated or revoked sooner. Performed at [facility], [place] [address] [place], [place] [address]   MRSA PCR Screening     Status: None   Collection Time: 08/08/19  7:01 AM   Specimen: Nasal Mucosa; Nasopharyngeal  Result Value Ref Range Status   MRSA by PCR NEGATIVE NEGATIVE Final    Comment:        The GeneXpert MRSA Assay (FDA approved for NASAL specimens only), is one component of a comprehensive MRSA colonization surveillance program. It is not intended to diagnose MRSA infection nor to guide or monitor treatment for MRSA infections. Performed at [facility], [place] [address] [place], [place] [address]           Radiology Studies: No results found.      Scheduled Meds: . sodium chloride   Intravenous Once  . sodium chloride   Intravenous Once  . amiodarone  200 mg Oral Daily  . Chlorhexidine Gluconate Cloth  6 each Topical Daily  .  divalproex  500 mg Oral Daily  . feeding supplement (ENSURE ENLIVE)  237 mL Oral TID BM  . ferrous sulfate  325 mg Oral BID WC  . levETIRAcetam  1,000 mg Oral BID  .  morphine injection  2 mg Intravenous Once  . phenytoin  100 mg Oral BID  . phytonadione  5 mg Oral Daily  . senna-docusate  1 tablet Oral BID  . sodium chloride flush  10-40 mL Intracatheter Q12H  . sodium polystyrene  30 g Oral Once  . vitamin B-12  100 mcg Oral Daily   Continuous Infusions: . sodium chloride 50 mL/hr at 08/10/19 1100     LOS: 20 days    Time spent: 35 minutes.     Elmarie Shiley, MD [facility] Pager [phone]  If 7PM-7AM, please contact night-coverage [email or web address] Password [facility] 08/10/2019, 12:43 PM

## 2019-08-10 NOTE — Progress Notes (Addendum)
Progress Note  Patient Name: Louis Ford. Date of Encounter: 08/10/2019  Primary Cardiologist: Dr. Jeani Hawking. Louis Ford  Subjective   No acute overnight events. Patient states bleeding has improved and he is no longer having any lightheadedness or dizziness. No chest pain or shortness of breath. He notes some abdominal pain this morning.  Inpatient Medications    Scheduled Meds: . sodium chloride   Intravenous Once  . sodium chloride   Intravenous Once  . amiodarone  200 mg Oral Daily  . Chlorhexidine Gluconate Cloth  6 each Topical Daily  . divalproex  500 mg Oral Daily  . feeding supplement (ENSURE ENLIVE)  237 mL Oral TID BM  . ferrous sulfate  325 mg Oral BID WC  . levETIRAcetam  1,000 mg Oral BID  .  morphine injection  2 mg Intravenous Once  . phenytoin  100 mg Oral BID  . phytonadione  5 mg Oral Daily  . senna-docusate  1 tablet Oral BID  . sodium chloride flush  10-40 mL Intracatheter Q12H  . sodium polystyrene  30 g Oral Once  . vitamin B-12  100 mcg Oral Daily   Continuous Infusions: . sodium chloride 50 mL/hr at 08/10/19 0800   PRN Meds: acetaminophen, aminocaproic acid, bisacodyl, labetalol, morphine injection, ondansetron **OR** ondansetron (ZOFRAN) IV, oxyCODONE-acetaminophen, polyethylene glycol, sodium chloride flush, sodium chloride flush   Vital Signs    Vitals:   08/10/19 0400 08/10/19 0600 08/10/19 0700 08/10/19 0800  BP: 130/84 107/78 90/70 107/70  Pulse:   60   Resp: 18 (!) 23 (!) 32 (!) 23  Temp: 98 F (36.7 C)   97.8 F (36.6 C)  TempSrc: Oral   Axillary  SpO2:   (!) 76% 100%  Weight:      Height:        Intake/Output Summary (Last 24 hours) at 08/10/2019 0838 Last data filed at 08/10/2019 0800 Gross per 24 hour  Intake 1518.06 ml  Output 2375 ml  Net -856.94 ml   Last 3 Weights 08/10/2019 08/06/2019 08/03/2019  Weight (lbs) 211 lb 6.7 oz 211 lb 3.2 oz 213 lb 3 oz  Weight (kg) 95.9 kg 95.8 kg 96.7 kg      Telemetry    Atrial  fibrillation with intermittent undersensing pacing (per Dr. Lovena Ford). At times, clear P waves can be seen and it looks like patient is in sinus rhythm with trigeminy PVCs. - Personally Reviewed  ECG    No new ECG tracing today. - Personally Reviewed  Physical Exam   GEN: 73 year old male resting comfortably in no acute distress.   Neck: Supple. No JVD. Cardiac: Irregularly irregular rhythm nut regular rate. No significant murmurs, rubs, or gallops.  Respiratory: Clear to auscultation bilaterally. No wheezes, rhonchi, or rales. GI: Soft, non-distended, and non-tender. MS: No lower extremity edema. No deformity. Skin: Warm and dry. Neuro:  Non focal deficits. Psych: Normal affect   Labs    High Sensitivity Troponin:  No results for input(s): TROPONINIHS in the last 720 hours.    Cardiac EnzymesNo results for input(s): TROPONINI in the last 168 hours. No results for input(s): TROPIPOC in the last 168 hours.   Chemistry Recent Labs  Lab 08/06/19 0956  08/08/19 0545 08/09/19 0436 08/10/19 0415  NA 137   < > 135 136 137  K 5.5*   < > 4.8 4.5 4.7  CL 111   < > 108 107 107  CO2 18*   < > 24 23 24  GLUCOSE 123*   < > 89 92 79  BUN 10   < > 12 10 10   CREATININE 1.14   < > 1.06 0.90 0.98  CALCIUM 7.8*   < > 7.8* 8.0* 7.9*  PROT 6.9  --   --   --   --   ALBUMIN 3.0*  --   --   --   --   AST 31  --   --   --   --   ALT 14  --   --   --   --   ALKPHOS 81  --   --   --   --   BILITOT 0.6  --   --   --   --   GFRNONAA >60   < > >60 >60 >60  GFRAA >60   < > >60 >60 >60  ANIONGAP 8   < > 3* 6 6   < > = values in this interval not displayed.     Hematology Recent Labs  Lab 08/08/19 0545 08/08/19 1700 08/09/19 0436 08/10/19 0415  WBC 5.2  --  5.3 5.6  RBC 3.04*  --  3.01* 3.03*  HGB 8.7* 9.3* 8.6* 8.8*  HCT 28.4* 30.1* 28.6* 28.8*  MCV 93.4  --  95.0 95.0  MCH 28.6  --  28.6 29.0  MCHC 30.6  --  30.1 30.6  RDW 15.2  --  15.5 15.9*  PLT 213  --  201 203    BNPNo results  for input(s): BNP, PROBNP in the last 168 hours.   DDimer No results for input(s): DDIMER in the last 168 hours.   Radiology    No results found.  Cardiac Studies   Device Check 06/30/2019: ICD check in clinic. Normal device function. Thresholds and sensing consistent with previous device measurements. Impedance trends stable over time. 7 "SVT" episodes, available EGMs appear to be irregular VS. 1 VF episode, 1 aborted shock. ATP unable to  terminate episode, rate slowed below detection. Histogram distribution appropriate for patient and level of activity. VT1 zone rates increased to 171bpm, VT2 zone rate @ 200bpm (ATP burst x2 ramp x2 w/ industry assistance), VF zone rate @ 231bpm per GT.  Polarity for shock path changed to alternating. Device programmed at appropriate safety margins. Device programmed to optimize intrinsic conduction. Device @ ERI, d/t be changed 07/2019. Pt enrolled in remote follow-up. Plan to check next remote in  61months, ROV in 5-59months per GT note on 06/11/19. Patient education completed including shock plan. _______________  Echocardiogram 04/09/2017: Study Conclusions: - Left ventricle: The cavity size was normal. There was mild   concentric hypertrophy. Systolic function was normal. The   estimated ejection fraction was in the range of 50% to 55%. Mild   hypokinesis of the inferolateral myocardium. - Ventricular septum: Septal motion showed abnormal function and   dyssynchrony. - Aortic valve: Transvalvular velocity was within the normal range.   There was no stenosis. There was no regurgitation. - Mitral valve: Transvalvular velocity was within the normal range.   There was no evidence for stenosis. There was mild regurgitation   directed posteriorly. - Left atrium: The atrium was severely dilated. - Right ventricle: The cavity size was normal. Wall thickness was   normal. Systolic function was normal. - Right atrium: The atrium was mildly dilated. -  Tricuspid valve: There was mild regurgitation. - Pulmonary arteries: Systolic pressure was within the normal   range. PA peak pressure: 32 mm Hg (  S).  Patient Profile   Louis Ford is a 73 y.o. male with a history of chronic systolic CHF/ischemic cardiomyopathy with improved EF of 50-55% on last Echo in 03/2017 s/p Biotronik ICD, paroxsymal atrial fibrillation, history of VT/VF, CVA, hypertension, hyperlipidemia, Factor VII deficiency, and seizure disorder. Patient was admitted on 07/20/2019 with symptomatic anemia after presenting with lightheadedness and dizziness following tooth extraction. Patient has required multiple transfusions. Cardiology was consulted on 08/18/2019 for evaluation of possible ICD malfunction at the request of Dr. Tyrell Antonio.   Assessment & Plan    Biotronik ICD with Possible Ventricular Undersensing - He is at [facility] based on last device check in 06/2019 and was pending ICD generator change out. Dr. Lovena Ford reviewed telemetry over the weekend and felt like has undersensing at times. Device will be formally interrogated by [employer] rep today. Reached out to Rep and they are aware of the patient. Per Dr. Lovena Ford, " If we can confirm sensing is satisfactory or can be programmed so, we will allow him to go to rehab prior to ICD generator change out."  Paroxysmal Atrial Fibrillation/History of VT and VF - Telemetry seems to show patient in and out of atrial fibrillation with controlled rate and occasional ventricular sensing. Trigeminy PVCs noted.  - Continue PO Amiodarone 200mg  daily. - Previously anticoagulated with Edoxaban; however, this was discontinued earlier this year during hospitalization in April and May after patient was found to have a large retroperitoneal hematoma following a fall. Has not been on any anticoagulation since that time.  Chronic Systolic CHF/Ischemic Cardiomyopathy - Most recent Echo from 03/2017 showed improved EF of 50-55% with mild hypokinesis of the  inferolateral myocardium. - Patient appears euvolemic on exam. - Home Lasix, Losartan, Coreg, Hydralazine, Imdur, and Spironolactone held due to hypotension. BP is improving so may be able to slowly add back some of home medications. Would probably add back low dose Losartan or Coreg first. - Continue to monitor volume status closely.  Otherwise, per primary team.  For questions or updates, please contact [place] Please consult [email or web address] for contact info under     Signed, Darreld Mclean, PA-C  08/10/2019, 8:38 AM    The patient was seen, examined and discussed with Darreld Mclean, PA-C  and I agree with the above.   Patient's device was interrogated and it shows ERI what time to replacement maximal 56 days, EP clinic was notified.  Otherwise the device is functioning normally and there is no undersensing.  Underlying rhythm is atrial fibrillation with ventricular rate 86-105, no anticoagulation due to prior history of retroperitoneal hematoma and anemia.  The patient feels okay.  Zickel exam does not show signs of fluid overload.  I would continue his home dose of Lasix losartan: Hydralazine and Imdur.  We will continue to hold spironolactone as he is hypotensive.  He can be discharged to the rehab, EP clinic will arrange admission for an ICD replacement.  [facility] will sign off.   Medication Recommendations:  As above Other recommendations (labs, testing, etc):  No further testing Follow up as an outpatient:  EP clinic   Ena Dawley, MD 08/10/2019

## 2019-08-10 NOTE — Op Note (Signed)
NAME: Sailor, Haughn MEDICAL RECORD DV:[id number] ACCOUNT [id number] DATE OF BIRTH:04-24-1946 FACILITY: [facility] LOCATION: [facility] PHYSICIAN:Treva Huyett M. Bane Hagy, [name]  OPERATIVE REPORT  DATE OF PROCEDURE:  08/08/2019  PREOPERATIVE DIAGNOSIS:  Persistent intermittent oral bleeding, coagulopathy.  POSTOPERATIVE DIAGNOSIS:  Persistent intermittent oral bleeding, coagulopathy.  PROCEDURE:  Suture of oral wounds.  SURGEON:  [name], [name]  ANESTHESIA:  General oral intubation, Dr. _____, attending.  DESCRIPTION OF PROCEDURE:  The patient was taken to the operating room and placed on the table in supine position.  General anesthesia was administered intravenously.  An oral endotracheal tube was placed and marked.  The patient was prepped and draped  for surgery.  A timeout was performed.  The posterior pharynx was suctioned, and a throat pack was placed.  0.25% Marcaine with 1:200,000 epinephrine was infiltrated in an inferior alveolar block on the right and left sides, and then the oral cavity was  inspected.  The upper arch was hemostatic.  The lower arch was edentulous but had an immature blood clot with some oozing at the area of tooth #27.  There was also some oozing in the area where tooth #29 would be.  The Bovie electrocautery was used to  cauterize, and it did appear that the bleeding was stopped, but there was some persistent oozing from the blow, so the decision was made to open the area and suture it primarily.  A 15 blade was used to incise from the area of the left canine to the area  of the right premolar.  The periosteum was reflected buccally and lingually along the alveolar crest.  There was no bone socket present with any bleeding as the alveoplasty had removed any remaining sockets from the past surgery.  Then periosteal  release and dissection was carried out using a Freer and a 15 blade.  The tissue was friable, but it appeared to be able to reach for primary closure.  A  tension-free Gelfoam sponges soaked with topical thrombin was placed in the wound, and then the  wound was closed with 3-0 chromic interrupted mattress and interlocking sutures.  Local anesthesia was administered in the anterior mandible, 0.25% Marcaine 1:200,000.  The lesion appeared to be relatively hemostatic.  There was some oozing at the suture  lines.  The oral cavity was irrigated, suctioned, and throat pack was removed.  The patient was left in the care of anesthesia for extubation, recovery room, and transfer back to the unit.  ESTIMATED BLOOD LOSS:  25 mL.  SPECIMENS:  None.  DRAINS:  None.  COUNTS:  Correct.  LN/NUANCE  D:08/08/2019 T:08/08/2019 JOB:[id number]

## 2019-08-10 NOTE — Progress Notes (Signed)
Hypoglycemic Event  CBG: 66  Treatment: 4oz orange juice  Symptoms: none  Follow-up CBG: Time: 2245 CBG Result: 86  Possible Reasons for Event: no oral intake  Comments/MD notified: [place]

## 2019-08-10 NOTE — Progress Notes (Signed)
[facility]. PROGRESS NOTE:   SUBJECTIVE: Resting comfortably. No  Complaints.  OBJECTIVE:  Vitals: Blood pressure 107/78, pulse 83, temperature 98 F (36.7 C), temperature source Oral, resp. rate (!) 23, height 5' 10.98" (1.803 m), weight 95.9 kg, SpO2 92 %. Lab results: Results for orders placed or performed during the hospital encounter of 07/20/19 (from the past 24 hour(s))  Glucose, capillary     Status: Abnormal   Collection Time: 08/09/19  7:58 AM  Result Value Ref Range   Glucose-Capillary 60 (L) 70 - 99 mg/dL   Comment 1 Notify RN    Comment 2 Document in Chart   Glucose, capillary     Status: Abnormal   Collection Time: 08/09/19  8:27 AM  Result Value Ref Range   Glucose-Capillary 50 (L) 70 - 99 mg/dL  Glucose, capillary     Status: Abnormal   Collection Time: 08/09/19  8:32 AM  Result Value Ref Range   Glucose-Capillary 102 (H) 70 - 99 mg/dL  Glucose, capillary     Status: None   Collection Time: 08/09/19 11:46 AM  Result Value Ref Range   Glucose-Capillary 95 70 - 99 mg/dL  Glucose, capillary     Status: Abnormal   Collection Time: 08/09/19  4:22 PM  Result Value Ref Range   Glucose-Capillary 65 (L) 70 - 99 mg/dL   Comment 1 Notify RN    Comment 2 Document in Chart   Glucose, capillary     Status: None   Collection Time: 08/09/19  4:50 PM  Result Value Ref Range   Glucose-Capillary 96 70 - 99 mg/dL  Glucose, capillary     Status: Abnormal   Collection Time: 08/09/19  9:55 PM  Result Value Ref Range   Glucose-Capillary 69 (L) 70 - 99 mg/dL  Glucose, capillary     Status: None   Collection Time: 08/09/19 10:31 PM  Result Value Ref Range   Glucose-Capillary 81 70 - 99 mg/dL  CBC     Status: Abnormal   Collection Time: 08/10/19  4:15 AM  Result Value Ref Range   WBC 5.6 4.0 - 10.5 K/uL   RBC 3.03 (L) 4.22 - 5.81 MIL/uL   Hemoglobin 8.8 (L) 13.0 - 17.0 g/dL   HCT 28.8 (L) 39.0 - 52.0 %   MCV 95.0 80.0 - 100.0 fL   MCH 29.0 26.0 - 34.0 pg   MCHC 30.6  30.0 - 36.0 g/dL   RDW 15.9 (H) 11.5 - 15.5 %   Platelets 203 150 - 400 K/uL   nRBC 0.5 (H) 0.0 - 0.2 %  Basic metabolic panel     Status: Abnormal   Collection Time: 08/10/19  4:15 AM  Result Value Ref Range   Sodium 137 135 - 145 mmol/L   Potassium 4.7 3.5 - 5.1 mmol/L   Chloride 107 98 - 111 mmol/L   CO2 24 22 - 32 mmol/L   Glucose, Bld 79 70 - 99 mg/dL   BUN 10 8 - 23 mg/dL   Creatinine, Ser 0.98 0.61 - 1.24 mg/dL   Calcium 7.9 (L) 8.9 - 10.3 mg/dL   GFR calc non Af Amer >60 >60 mL/min   GFR calc Af Amer >60 >60 mL/min   Anion gap 6 5 - 15  Glucose, capillary     Status: Abnormal   Collection Time: 08/10/19  7:18 AM  Result Value Ref Range   Glucose-Capillary 65 (L) 70 - 99 mg/dL   Radiology Results: No results found. General  appearance: no distress Head: Normocephalic, without obvious abnormality, atraumatic Eyes: negative Nose: Nares normal. Septum midline. Mucosa normal. No drainage or sinus tenderness. Throat: sutures intact. hemostatic. pharynx clear.  Neck: no adenopathy  ASSESSMENT: Hemostatic s/p re-suturing 2 days ago. Hb trending up.  PLAN: Will follow.   [name] 08/10/2019

## 2019-08-11 LAB — HEMOGLOBIN AND HEMATOCRIT, BLOOD
HCT: 28.6 % — ABNORMAL LOW (ref 39.0–52.0)
Hemoglobin: 8.7 g/dL — ABNORMAL LOW (ref 13.0–17.0)

## 2019-08-11 LAB — GLUCOSE, CAPILLARY
Glucose-Capillary: 74 mg/dL (ref 70–99)
Glucose-Capillary: 93 mg/dL (ref 70–99)
Glucose-Capillary: 103 mg/dL — ABNORMAL HIGH (ref 70–99)

## 2019-08-11 LAB — INSULIN-LIKE GROWTH FACTOR: Somatomedin C: 111 ng/mL (ref 59–230)

## 2019-08-11 MED ORDER — SODIUM CHLORIDE 0.9 % IV SOLN
INTRAVENOUS | Status: DC
  Administered 2019-08-11 – 2019-08-17 (×10): via INTRAVENOUS

## 2019-08-11 MED ORDER — CARVEDILOL 3.125 MG PO TABS
3.1250 mg | ORAL_TABLET | Freq: Two times a day (BID) | ORAL | Status: DC
  Administered 2019-08-11 – 2019-08-21 (×18): 3.125 mg via ORAL
  Filled 2019-08-11 (×21): qty 1

## 2019-08-11 MED ORDER — MAGIC MOUTHWASH W/LIDOCAINE
15.0000 mL | Freq: Four times a day (QID) | ORAL | Status: DC | PRN
  Administered 2019-08-12: 15 mL via ORAL
  Filled 2019-08-11 (×2): qty 15

## 2019-08-11 NOTE — Progress Notes (Signed)
Checked pt's CBG from PICC line, was 74. Results not showing yet, will continue to monitor machine for docking

## 2019-08-11 NOTE — Progress Notes (Signed)
Patient received as transfer from ICU.  Patient oriented to unit and equipment.  Placed on telemetry and verified with CMT.  Patient's wife updated by ICU RN Jinny Blossom.  Patient stable and comfortable at this time. Will continue to monitor.

## 2019-08-11 NOTE — Progress Notes (Signed)
Progress Note  Patient Name: Louis Ford. Date of Encounter: 08/11/2019  Primary Cardiologist: Dr. Jeani Hawking. Louis Ford  Subjective   No acute overnight events. Patient continues to have might bleeding from his mounths, denies chest pain, palpitations or SOB.  Inpatient Medications    Scheduled Meds: . sodium chloride   Intravenous Once  . sodium chloride   Intravenous Once  . amiodarone  200 mg Oral Daily  . carvedilol  3.125 mg Oral BID WC  . Chlorhexidine Gluconate Cloth  6 each Topical Daily  . divalproex  500 mg Oral Daily  . feeding supplement (ENSURE ENLIVE)  237 mL Oral TID BM  . ferrous sulfate  325 mg Oral BID WC  . levETIRAcetam  1,000 mg Oral BID  .  morphine injection  2 mg Intravenous Once  . phenytoin  100 mg Oral BID  . phytonadione  5 mg Oral Daily  . senna-docusate  1 tablet Oral BID  . sodium chloride flush  10-40 mL Intracatheter Q12H  . sodium polystyrene  30 g Oral Once  . vitamin B-12  100 mcg Oral Daily   Continuous Infusions:  PRN Meds: acetaminophen, aminocaproic acid, bisacodyl, labetalol, morphine injection, ondansetron **OR** ondansetron (ZOFRAN) IV, oxyCODONE-acetaminophen, polyethylene glycol, sodium chloride flush, sodium chloride flush   Vital Signs    Vitals:   08/11/19 0800 08/11/19 0900 08/11/19 1000 08/11/19 1100  BP: (!) 94/55 111/76 119/64 116/89  Pulse:      Resp: (!) 23 13 (!) 24 (!) 24  Temp: 97.6 F (36.4 C)     TempSrc: Axillary     SpO2:      Weight:      Height:        Intake/Output Summary (Last 24 hours) at 08/11/2019 1121 Last data filed at 08/11/2019 1113 Gross per 24 hour  Intake 880.11 ml  Output 1425 ml  Net -544.89 ml   Last 3 Weights 08/10/2019 08/06/2019 08/03/2019  Weight (lbs) 211 lb 6.7 oz 211 lb 3.2 oz 213 lb 3 oz  Weight (kg) 95.9 kg 95.8 kg 96.7 kg      Telemetry    Atrial fibrillation with intermittent undersensing pacing (per Dr. Lovena Ford). At times, clear P waves can be seen and it looks  like patient is in sinus rhythm with trigeminy PVCs. - Personally Reviewed  ECG    No new ECG tracing today. - Personally Reviewed  Physical Exam   GEN: 73 year old male resting comfortably in no acute distress.   Neck: Supple. No JVD. Cardiac: Irregularly irregular rhythm nut regular rate. No significant murmurs, rubs, or gallops.  Respiratory: Clear to auscultation bilaterally. No wheezes, rhonchi, or rales. GI: Soft, non-distended, and non-tender. MS: No lower extremity edema. No deformity. Skin: Warm and dry. Neuro:  Non focal deficits. Psych: Normal affect   Labs    High Sensitivity Troponin:  No results for input(s): TROPONINIHS in the last 720 hours.    Cardiac EnzymesNo results for input(s): TROPONINI in the last 168 hours. No results for input(s): TROPIPOC in the last 168 hours.   Chemistry Recent Labs  Lab 08/06/19 0956  08/08/19 0545 08/09/19 0436 08/10/19 0415  NA 137   < > 135 136 137  K 5.5*   < > 4.8 4.5 4.7  CL 111   < > 108 107 107  CO2 18*   < > 24 23 24   GLUCOSE 123*   < > 89 92 79  BUN 10   < >  12 10 10   CREATININE 1.14   < > 1.06 0.90 0.98  CALCIUM 7.8*   < > 7.8* 8.0* 7.9*  PROT 6.9  --   --   --   --   ALBUMIN 3.0*  --   --   --   --   AST 31  --   --   --   --   ALT 14  --   --   --   --   ALKPHOS 81  --   --   --   --   BILITOT 0.6  --   --   --   --   GFRNONAA >60   < > >60 >60 >60  GFRAA >60   < > >60 >60 >60  ANIONGAP 8   < > 3* 6 6   < > = values in this interval not displayed.     Hematology Recent Labs  Lab 08/08/19 0545  08/09/19 0436 08/10/19 0415 08/11/19 0449  WBC 5.2  --  5.3 5.6  --   RBC 3.04*  --  3.01* 3.03*  --   HGB 8.7*   < > 8.6* 8.8* 8.7*  HCT 28.4*   < > 28.6* 28.8* 28.6*  MCV 93.4  --  95.0 95.0  --   MCH 28.6  --  28.6 29.0  --   MCHC 30.6  --  30.1 30.6  --   RDW 15.2  --  15.5 15.9*  --   PLT 213  --  201 203  --    < > = values in this interval not displayed.    BNPNo results for input(s): BNP,  PROBNP in the last 168 hours.   DDimer No results for input(s): DDIMER in the last 168 hours.   Radiology    No results found.  Cardiac Studies   Device Check 06/30/2019: ICD check in clinic. Normal device function. Thresholds and sensing consistent with previous device measurements. Impedance trends stable over time. 7 "SVT" episodes, available EGMs appear to be irregular VS. 1 VF episode, 1 aborted shock. ATP unable to  terminate episode, rate slowed below detection. Histogram distribution appropriate for patient and level of activity. VT1 zone rates increased to 171bpm, VT2 zone rate @ 200bpm (ATP burst x2 ramp x2 w/ industry assistance), VF zone rate @ 231bpm per GT.  Polarity for shock path changed to alternating. Device programmed at appropriate safety margins. Device programmed to optimize intrinsic conduction. Device @ ERI, d/t be changed 07/2019. Pt enrolled in remote follow-up. Plan to check next remote in  44months, ROV in 5-25months per GT note on 06/11/19. Patient education completed including shock plan. _______________  Echocardiogram 04/09/2017: Study Conclusions: - Left ventricle: The cavity size was normal. There was mild   concentric hypertrophy. Systolic function was normal. The   estimated ejection fraction was in the range of 50% to 55%. Mild   hypokinesis of the inferolateral myocardium. - Ventricular septum: Septal motion showed abnormal function and   dyssynchrony. - Aortic valve: Transvalvular velocity was within the normal range.   There was no stenosis. There was no regurgitation. - Mitral valve: Transvalvular velocity was within the normal range.   There was no evidence for stenosis. There was mild regurgitation   directed posteriorly. - Left atrium: The atrium was severely dilated. - Right ventricle: The cavity size was normal. Wall thickness was   normal. Systolic function was normal. - Right atrium: The atrium was mildly dilated. -  Tricuspid valve: There was  mild regurgitation. - Pulmonary arteries: Systolic pressure was within the normal   range. PA peak pressure: 32 mm Hg (S).  Patient Profile   Louis Ford is a 73 y.o. male with a history of chronic systolic CHF/ischemic cardiomyopathy with improved EF of 50-55% on last Echo in 03/2017 s/p Biotronik ICD, paroxsymal atrial fibrillation, history of VT/VF, CVA, hypertension, hyperlipidemia, Factor VII deficiency, and seizure disorder. Patient was admitted on 07/20/2019 with symptomatic anemia after presenting with lightheadedness and dizziness following tooth extraction. Patient has required multiple transfusions. Cardiology was consulted on 08/18/2019 for evaluation of possible ICD malfunction at the request of Dr. Tyrell Antonio.   Assessment & Plan    Biotronik ICD with Possible Ventricular Undersensing - He is at [facility] based on last device check in 06/2019 and was pending ICD generator change out. Dr. Lovena Ford reviewed telemetry over the weekend and felt like has undersensing at times. Device will be formally interrogated by Bi[employer]ronik rep today. Reached out to Rep and they are aware of the patient. Per Dr. Lovena Ford, " If we can confirm sensing is satisfactory or can be programmed so, we will allow him to go to rehab prior to ICD generator change out."  Paroxysmal Atrial Fibrillation/History of VT and VF - Telemetry seems to show patient in and out of atrial fibrillation with controlled rate and occasional ventricular sensing. Trigeminy PVCs noted.  - Continue PO Amiodarone 200mg  daily. - Previously anticoagulated with Edoxaban; however, this was discontinued earlier this year during hospitalization in April and May after patient was found to have a large retroperitoneal hematoma following a fall. Has not been on any anticoagulation since that time.  Chronic Systolic CHF/Ischemic Cardiomyopathy - Most recent Echo from 03/2017 showed improved EF of 50-55% with mild hypokinesis of the inferolateral myocardium. -  Patient appears euvolemic on exam. - Home Lasix, Losartan, Coreg, Hydralazine, Imdur, and Spironolactone held due to hypotension. BP is improving so may be able to slowly add back some of home medications. Would probably add back low dose Losartan or Coreg first. - Continue to monitor volume status closely.  Patient's device was interrogated yesterday and it shows ERI what time to replacement maximal 56 days, EP clinic was notified, Dr Louis Ford will follow, is wife was called already.  Otherwise the device is functioning normally and there is no undersensing.  Underlying rhythm is atrial fibrillation with ventricular rate 86-105, no anticoagulation due to prior history of retroperitoneal hematoma and anemia.  The patient feels okay. We will continue to hold spironolactone as he is hypotensive and continue just a low dose carvedilol. He appears dry, consider low dose IV fluids as he is unable to eat properly. He can be discharged to the rehab, EP clinic will arrange admission for an ICD replacement.  [facility] will sign off.   Medication Recommendations:  As above Other recommendations (labs, testing, etc):  No further testing Follow up as an outpatient:  EP clinic   Louis Dawley, Louis Ford 08/11/2019

## 2019-08-11 NOTE — Progress Notes (Signed)
Occupational Therapy Treatment Patient Details Name: Louis Ford. MRN: [id number] DOB: July 14, 1946 Today's Date: 08/11/2019    History of present illness [facility]. is a 73 y.o. 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  Pt s/p resuturing of oral wounds on 08/08/19.  Pt also with Biotronik ICD with Possible Ventricular Undersensing and cardiology following.   OT comments  Pt self limited session.  Issued new piece of level 2 theraband and written HEP. Worked through exercises. Encouraged pt to participate in adls daily also to build strength.  Follow Up Recommendations  SNF;Supervision/Assistance - 24 hour    Equipment Recommendations  None recommended by OT    Recommendations for Other Services      Precautions / Restrictions Precautions Precautions: Fall Precaution Comments: monitor HR Restrictions Weight Bearing Restrictions: No       Mobility Bed Mobility                  Transfers                      Balance                                           ADL either performed or assessed with clinical judgement   ADL                                         General ADL Comments: pt only agreeable to UE exercises today, despite his identifying that getting up and walking would help the most. Encouraged pt to help with ADLs also, daily to help build strength     Vision       Perception     Praxis      Cognition Arousal/Alertness: Awake/alert Behavior During Therapy: WFL for tasks  assessed/performed Overall Cognitive Status: Within Functional Limits for tasks assessed                                          Exercises General Exercises - Upper Extremity Shoulder Flexion: Strengthening;Both;10 reps;Seated;Theraband(written HEP provided) Theraband Level (Shoulder Flexion): Level 2 (Red)(for all) Shoulder Horizontal ABduction: Strengthening;Both;Theraband Elbow Flexion: Strengthening;Both;Theraband Theraband Level (Elbow Flexion): Level 2 (Red) Elbow Extension: Strengthening;Both;Theraband   Shoulder Instructions       General Comments      Pertinent Vitals/ Pain       Pain Assessment: Faces Faces Pain Scale: Hurts little more Pain Location: generalized Pain Intervention(s): Limited activity within patient's tolerance;Monitored during session  Home Living                                          Prior Functioning/Environment              Frequency  Min 2X/week        Progress Toward Goals  OT Goals(current goals can now be found in the care plan section)  Progress towards OT goals: (slow progress)  ADL Goals Pt/caregiver will Perform Home Exercise Program: (P) Increased strength;Both right and left upper extremity;With theraband;With written HEP provided(level 2)  Plan      Co-evaluation                 AM-PAC OT "6 Clicks" Daily Activity     Outcome Measure   Help from another person eating meals?: None Help from another person taking care of personal grooming?: A Little Help from another person toileting, which includes using toliet, bedpan, or urinal?: A Little Help from another person bathing (including washing, rinsing, drying)?: A Little Help from another person to put on and taking off regular upper body clothing?: A Little Help from another person to put on and taking off regular lower body clothing?: A Little 6 Click Score: 19    End of Session    OT Visit Diagnosis: Unsteadiness  on feet (R26.81);Muscle weakness (generalized) (M62.81);History of falling (Z91.81)   Activity Tolerance (pt self limited session)   Patient Left in bed;with call bell/phone within reach;with bed alarm set   Nurse Communication          Time: 9937-1696 OT Time Calculation (min): 17 min  Charges: OT General Charges $OT Visit: 1 Visit OT Treatments $Therapeutic Exercise: 8-22 mins  Lesle Chris, OTR/L Acute Rehabilitation Services [phone] [facility] pager [phone] office 08/11/2019   [place] 08/11/2019, 2:58 PM

## 2019-08-11 NOTE — Progress Notes (Signed)
Spoke with MD Tyrell Antonio, and MD request that IV team draw blood to check the patient's blood glucose level. The MD requests blood be obtained from the PICC line. RN Archer Asa asked that when blood sugar is to be collected that a DBIV consult is placed so that the lab can be obtained

## 2019-08-11 NOTE — Progress Notes (Addendum)
Patient is refusing the daily CHG bath. The patient was educated on how the ICU has a protocol that has to be followed for infection prevention and CHG baths are one of those protocols. Patient stated, "No I'm alright. When you make it rain or snow, I will listen to you." The patient was educated again on how he has a central line and that it is important to have the CHG bath to prevent infections. The patient responded by saying, "God can take care of me." This nurse respected the patient's wishes and did not perform the CHG bath. The patient was very argumentative and irritable throughout the conversation. Nurse tech, Melony Overly was present for the entire encounter. Will continue to monitor.

## 2019-08-11 NOTE — Progress Notes (Signed)
Called and updated wife Mardene Celeste about pt's being transferred to 4th floor

## 2019-08-11 NOTE — Progress Notes (Signed)
PROGRESS NOTE    [facility].  LFY:[id number] DOB: 1946/10/14 DOA: 07/20/2019 PCP: [name], MD   Brief Narrative: 73 year old with past medical history significant for cardiovascular disease, chronic systolic heart failure ejection fraction 25%, status post AICD, chronic A. 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 Patient presented to the hospital on 7/27 with lightheadedness and dizziness after having tooth removed, bleeding from the mouth and unable to eat properly.  Per the patient he has not been on any blood thinner since he had retroperitoneal bleed about 2 months prior to admission.  Patient is able to walk with a walker.  Also has a history of seizure and is on multiple medications.  Patient had gone to the dentist and had almost all his teeth removed on 07/08/2019, following that he had continued bleeding into his mouth and he had talk to their dentist who recommended to control with gauze piece.  Patient has had poor oral intake.    Since hospitalization, patient has required occasional transfusion as well as FFP.  Also due to poor oral intake, has episode of hypoglycemia requiring D5 drip.  The morning 8/11 patient had another episode of oral bleeding through several pads, although hemoglobin has remained stable.  Patient's second COVID test was also negative.  Patient underwent re-suturing of oral  wound due to persistent bleeding on 8/15.  Post op patient became agitated and was a started on Precedex drip transiently.  Post Precedex drip he became hypotensive.  After stopping Precedex drip and IV bolus patient blood pressure stabilized.  Patient on telemetry was noted to have some abnormal QRS and question if the defibrillator was undersensing.  Cardiology was consulted and defibrillator was interrogated.  Defibrillator is working fine and patient will need to follow-up with Dr. Lovena Le for ICD replacement.   Patient has been having.  Of hypoglycemia due to poor oral intake.  Over hospitalization his blood sugar was reading as low as  30 but patient was alert.  Capillary glucose from fingerstick likely not accurate.  Endocrinology was consulted for further evaluation.  It was determined that capillary glucose fingerstick was not accurate, venous glucose has been above 65.  Endocrinologist recommended to check insulin, C-peptide and sulfonylurea if venous glucose less than 55. Patient started to eat a little bit more today.  If his blood sugar remained stable above 70 he will be able to be transferred to rehab on 8/19  Assessment & Plan:   Principal Problem:   Anemia due to blood loss, acute Active Problems:   HTN (hypertension)   Coronary artery disease involving native heart   Automatic implantable cardioverter-defibrillator in situ   Seizure (HCC)   Renal insufficiency   Factor VII deficiency (HCC)   Chronic systolic CHF (congestive heart failure), NYHA class 2 (HCC)   Chronic kidney disease (CKD), stage III (moderate) (HCC)   Oral bleeding   Coagulopathy ([place])   Hospice care patient   Palliative care patient   Complications due to automatic implantable cardioverter-defibrillator    Anemia due to acute blood loss, symptomatic anemia: post operative dental bleeding.  Patient has required blood transfusion during this admission due to dropping hemoglobin. She has also required FFP and vitamin K. Dr. [name] oral surgeon did suture of incision on 8/2 He has a history of factor VII deficiency/ ?, hematology was consulted.  Work-up has been sent out. Need vitamin K for 10 to 14 days. Per hematology is some concern  for possible C PVC of liver and possible related coagulopathy is recommending vitamin K. Patient also received Amicar S/P 1 unit PRBC 8-14. S/P resuturing of oral wound 8/15. Stable.   Hypoglycemia: Secondary to poor oral intake ? . Cortisol level normal at 10.  TSH mildly  elevated. capillary glucose not accurate.  CBG capillary at 60 serum 100/.  Appreciate Dr Buddy Duty help.  Will need to check protein C and insulin level if blood sugar venous sample less than 55.  So far patient has been above 60.  Patient has been off of D5 IV fluids.  Encourage oral intake.   Hypotension; post surgery likely related to precedex. Patient became agitated after surgery in PACU. Was transiently on precex gtt. Subsequently became hypotensive SBP in the 70. Anesthesiology ordered IV bolus. SBP in the high 80.  Patient also with spike after QRS, unclear if defibrillator is censing correctly. Cardiology has been consulted to evaluate rhythm.   BP improved.   Possible Ventricular Undersensing;  [facility] evaluation.  Defibrillator was interrogated, defibrillator is working normally.  Constipation:  Schedule Senokot. Dulcolax PRN  Mild hyperkalemia: Resolved after 2 doses of lokelma.   Dizziness lightheadedness: Likely related to blood loss. Improved.   Chronic congestive heart failure status post AICD. Due to soft blood pressure, Aldactone, Coreg, nitrates and hydralazine has been on hold. Cardiology follow up.  Resume low-dose carvedilol.  Chronic A. fib: Continue with amiodarone. PRN labetalol.  Resume low-dose carvedilol.  History of seizure disorder: Continue with Depakote, Keppra , phenytoin.  Dementia without behavioral disturbance: Stable  Deconditioning: He needs  PT rehab.   Nutrition Problem: Inadequate oral intake Etiology: acute illness, mouth pain    Signs/Symptoms: per patient/family report    Interventions: Ensure Enlive (each supplement provides 350kcal and 20 grams of protein), Magic cup, MVI  Estimated body mass index is 29.5 kg/m as calculated from the following:   Height as of this encounter: 5' 10.98" (1.803 m).   Weight as of this encounter: 95.9 kg.   DVT prophylaxis: scd  Code Status: full code Family  Communication: wife over phone Disposition Plan: SNF when CBG stable.  Consultants:   Dr [name]  Hematology   Procedures:   8/2 -alveoplasty with re-suturing of oral wound   Antimicrobials:  none  Subjective: He is feeling okay, denies worsening pain.  Is willing to eat tomorrow  Objective: Vitals:   08/11/19 1000 08/11/19 1100 08/11/19 1200 08/11/19 1440  BP: 119/64 116/89 108/73 106/73  Pulse:    97  Resp: (!) 24 (!) 24 18 20   Temp:    98.4 F (36.9 C)  TempSrc:    Oral  SpO2:    100%  Weight:      Height:        Intake/Output Summary (Last 24 hours) at 08/11/2019 1521 Last data filed at 08/11/2019 1405 Gross per 24 hour  Intake 1210 ml  Output 1275 ml  Net -65 ml   Filed Weights   08/03/19 1287 08/06/19 0432 08/10/19 0146  Weight: 96.7 kg 95.8 kg 95.9 kg    Examination:  General exam: NAD Respiratory system: CTA Cardiovascular system:  S 1, S 2 RRR Gastrointestinal system: BS present, soft, nt Central nervous system: Non focal.  Extremities; Symmetric power.  Skin: no rashes.    Data Reviewed: I have personally reviewed following labs and imaging studies  CBC: Recent Labs  Lab 08/07/19 1612 08/08/19 0002 08/08/19 0545 08/08/19 1700 08/09/19 0436 08/10/19 0415 08/11/19 0449  WBC 5.9 5.3 5.2  --  5.3 5.6  --   NEUTROABS 3.7 2.8  --   --   --   --   --   HGB 9.2* 8.9* 8.7* 9.3* 8.6* 8.8* 8.7*  HCT 31.3* 28.6* 28.4* 30.1* 28.6* 28.8* 28.6*  MCV 94.0 93.5 93.4  --  95.0 95.0  --   PLT 270 226 213  --  201 203  --    Basic Metabolic Panel: Recent Labs  Lab 08/06/19 0956 08/07/19 0546 08/07/19 1303 08/07/19 1615 08/08/19 0545 08/08/19 1700 08/09/19 0436 08/10/19 0415  NA 137 135  --   --  135  --  136 137  K 5.5* 5.5*  --   --  4.8  --  4.5 4.7  CL 111 108  --   --  108  --  107 107  CO2 18* 22  --   --  24  --  23 24  GLUCOSE 123* 79 60* 95 89  --  92 79  BUN 10 12  --   --  12  --  10 10  CREATININE 1.14 1.01  --   --  1.06  --   0.90 0.98  CALCIUM 7.8* 7.7*  --   --  7.8*  --  8.0* 7.9*  MG  --   --   --   --   --  1.9  --   --    GFR: Estimated Creatinine Clearance: 82.8 mL/min (by C-G formula based on SCr of 0.98 mg/dL). Liver Function Tests: Recent Labs  Lab 08/06/19 0956  AST 31  ALT 14  ALKPHOS 81  BILITOT 0.6  PROT 6.9  ALBUMIN 3.0*   No results for input(s): LIPASE, AMYLASE in the last 168 hours. No results for input(s): AMMONIA in the last 168 hours. Coagulation Profile: No results for input(s): INR, PROTIME in the last 168 hours. Cardiac Enzymes: No results for input(s): CKTOTAL, CKMB, CKMBINDEX, TROPONINI in the last 168 hours. BNP (last 3 results) Recent Labs    11/26/18 1025  PROBNP 56.0   HbA1C: No results for input(s): HGBA1C in the last 72 hours. CBG: Recent Labs  Lab 08/10/19 2143 08/10/19 2244 08/10/19 2247 08/11/19 0741 08/11/19 1144  GLUCAP 66* 57* 86 74 93   Lipid Profile: No results for input(s): CHOL, HDL, LDLCALC, TRIG, CHOLHDL, LDLDIRECT in the last 72 hours. Thyroid Function Tests: No results for input(s): TSH, T4TOTAL, FREET4, T3FREE, THYROIDAB in the last 72 hours. Anemia Panel: No results for input(s): VITAMINB12, FOLATE, FERRITIN, TIBC, IRON, RETICCTPCT in the last 72 hours. Sepsis Labs: No results for input(s): PROCALCITON, LATICACIDVEN in the last 168 hours.  Recent Results (from the past 240 hour(s))  SARS CORONAVIRUS 2 Nasal Swab Aptima Multi Swab     Status: None   Collection Time: 08/04/19  9:52 AM   Specimen: Aptima Multi Swab; Nasal Swab  Result Value Ref Range Status   SARS Coronavirus 2 NEGATIVE NEGATIVE Final    Comment: (NOTE) SARS-CoV-2 target nucleic acids are NOT DETECTED. The SARS-CoV-2 RNA is generally detectable in upper and lower respiratory specimens during the acute phase of infection. Negative results do not preclude SARS-CoV-2 infection, do not rule out co-infections with other pathogens, and should not be used as the sole basis  for treatment or other patient management decisions. Negative results must be combined with clinical observations, patient history, and epidemiological information. The expected result is Negative. Fact Sheet for Patients: SugarRoll.be Fact Sheet for  Healthcare Providers: https://www.woods-mathews.com/ This test is not yet approved or cleared by the [place] and  has been authorized for detection and/or diagnosis of SARS-CoV-2 by FDA under an Emergency Use Authorization (EUA). This EUA will remain  in effect (meaning this test can be used) for the duration of the COVID-19 declaration under Section 56 4(b)(1) of the Act, 21 U.S.C. section 360bbb-3(b)(1), unless the authorization is terminated or revoked sooner. Performed at [facility], [place] [address], [place], [place] [address]   MRSA PCR Screening     Status: None   Collection Time: 08/08/19  7:01 AM   Specimen: Nasal Mucosa; Nasopharyngeal  Result Value Ref Range Status   MRSA by PCR NEGATIVE NEGATIVE Final    Comment:        The GeneXpert MRSA Assay (FDA approved for NASAL specimens only), is one component of a comprehensive MRSA colonization surveillance program. It is not intended to diagnose MRSA infection nor to guide or monitor treatment for MRSA infections. Performed at [facility], [place] [address] [place], [place] [address]          Radiology Studies: No results found.      Scheduled Meds: . sodium chloride   Intravenous Once  . sodium chloride   Intravenous Once  . amiodarone  200 mg Oral Daily  . carvedilol  3.125 mg Oral BID WC  . Chlorhexidine Gluconate Cloth  6 each Topical Daily  . divalproex  500 mg Oral Daily  . feeding supplement (ENSURE ENLIVE)  237 mL Oral TID BM  . ferrous sulfate  325 mg Oral BID WC  . levETIRAcetam  1,000 mg Oral BID  .  morphine injection  2 mg Intravenous Once  . phenytoin  100 mg Oral BID   . phytonadione  5 mg Oral Daily  . senna-docusate  1 tablet Oral BID  . sodium chloride flush  10-40 mL Intracatheter Q12H  . sodium polystyrene  30 g Oral Once  . vitamin B-12  100 mcg Oral Daily   Continuous Infusions: . sodium chloride       LOS: 21 days    Time spent: 35 minutes.     Elmarie Shiley, MD [facility] Pager [phone]  If 7PM-7AM, please contact night-coverage [email or web address] Password [facility] 08/11/2019, 3:21 PM

## 2019-08-11 NOTE — Progress Notes (Signed)
Pt with increased bleeding from gum.  Raliegh Ip Schorr notified.  New order received to use Miracle Mouthwash with lidocaine.

## 2019-08-11 NOTE — Progress Notes (Signed)
Spoke with Archer Asa RN and she had a question concerning the patient's blood glucose levels being drawn via the PICC line verse capillary. Message sent to MD [employer] for comformation

## 2019-08-11 NOTE — TOC Transition Note (Addendum)
Transition of Care [facility]) - CM/SW Discharge Note   Patient Details  Name: [facility]. MRN: [id number] Date of Birth: Nov 21, 1946  Transition of Care [facility]) CM/SW Contact:  Teion Ballin, Marjie Skiff, RN Phone Number: 08/11/2019, 10:02 AM   Clinical Narrative:     [facility] will not have a SNF bed until Friday and pt likely to be ready for DC prior to Friday. This CM spoke with wife via phone who accepts bed at [facility]. [facility] liaison alerted of need for bed. Will need new insurance authorization for [facility]. MD asked for new Covid test as well.    Readmission Risk Interventions Readmission Risk Prevention Plan 08/07/2019  Transportation Screening Complete  PCP or Specialist Appt within 3-5 Days Complete  HRI or [place] Not Complete  HRI or Home Care Consult comments To SNF  Social Work Consult for [place] Planning/Counseling Not Complete  SW consult not completed comments NA  Palliative Care Screening Not Applicable  Medication Review Press photographer) Complete  Some recent data might be hidden

## 2019-08-12 ENCOUNTER — Telehealth: Payer: Self-pay | Admitting: Hematology

## 2019-08-12 ENCOUNTER — Inpatient Hospital Stay (HOSPITAL_COMMUNITY): Payer: Medicare Other | Admitting: Anesthesiology

## 2019-08-12 ENCOUNTER — Encounter: Payer: Medicare Other | Admitting: Hematology

## 2019-08-12 ENCOUNTER — Encounter (HOSPITAL_COMMUNITY)
Admission: EM | Disposition: A | Discharge status: Skilled Nursing Facility | Payer: Self-pay | Source: Home / Self Care | Attending: Internal Medicine

## 2019-08-12 HISTORY — PX: CYST EXCISION: SHX5701

## 2019-08-12 LAB — PROTIME-INR
INR: 1.4 — ABNORMAL HIGH (ref 0.8–1.2)
Prothrombin Time: 17.3 seconds — ABNORMAL HIGH (ref 11.4–15.2)

## 2019-08-12 LAB — CBC
HCT: 28.9 % — ABNORMAL LOW (ref 39.0–52.0)
Hemoglobin: 8.7 g/dL — ABNORMAL LOW (ref 13.0–17.0)
MCH: 29.1 pg (ref 26.0–34.0)
MCHC: 30.1 g/dL (ref 30.0–36.0)
MCV: 96.7 fL (ref 80.0–100.0)
Platelets: 210 10*3/uL (ref 150–400)
RBC: 2.99 MIL/uL — ABNORMAL LOW (ref 4.22–5.81)
RDW: 16.8 % — ABNORMAL HIGH (ref 11.5–15.5)
WBC: 4.9 10*3/uL (ref 4.0–10.5)
nRBC: 0.6 % — ABNORMAL HIGH (ref 0.0–0.2)

## 2019-08-12 LAB — GLUCOSE, CAPILLARY
Glucose-Capillary: 104 mg/dL — ABNORMAL HIGH (ref 70–99)
Glucose-Capillary: 127 mg/dL — ABNORMAL HIGH (ref 70–99)
Glucose-Capillary: 45 mg/dL — ABNORMAL LOW (ref 70–99)
Glucose-Capillary: 73 mg/dL (ref 70–99)
Glucose-Capillary: 74 mg/dL (ref 70–99)
Glucose-Capillary: 92 mg/dL (ref 70–99)

## 2019-08-12 LAB — NOVEL CORONAVIRUS, NAA (HOSP ORDER, SEND-OUT TO REF LAB; TAT 18-24 HRS): SARS-CoV-2, NAA: DETECTED — AB

## 2019-08-12 LAB — BASIC METABOLIC PANEL
Anion gap: 3 — ABNORMAL LOW (ref 5–15)
BUN: 9 mg/dL (ref 8–23)
CO2: 22 mmol/L (ref 22–32)
Calcium: 7.9 mg/dL — ABNORMAL LOW (ref 8.9–10.3)
Chloride: 113 mmol/L — ABNORMAL HIGH (ref 98–111)
Creatinine, Ser: 0.88 mg/dL (ref 0.61–1.24)
GFR calc Af Amer: >60 mL/min (ref >60)
GFR calc non Af Amer: >60 mL/min (ref >60)
Glucose, Bld: 76 mg/dL (ref 70–99)
Potassium: 4.7 mmol/L (ref 3.5–5.1)
Sodium: 138 mmol/L (ref 135–145)

## 2019-08-12 LAB — APTT: aPTT: 45 seconds — ABNORMAL HIGH (ref 24–36)

## 2019-08-12 SURGERY — CYST REMOVAL
Anesthesia: General

## 2019-08-12 MED ORDER — LIDOCAINE 2% (20 MG/ML) 5 ML SYRINGE
INTRAMUSCULAR | Status: AC
  Filled 2019-08-12: qty 5

## 2019-08-12 MED ORDER — PROPOFOL 10 MG/ML IV BOLUS
INTRAVENOUS | Status: AC
  Filled 2019-08-12: qty 20

## 2019-08-12 MED ORDER — SODIUM CHLORIDE 0.9 % IV SOLN
INTRAVENOUS | Status: DC | PRN
  Administered 2019-08-12: 20 ug/min via INTRAVENOUS

## 2019-08-12 MED ORDER — ALBUMIN HUMAN 5 % IV SOLN
INTRAVENOUS | Status: DC | PRN
  Administered 2019-08-12: 16:00:00 via INTRAVENOUS

## 2019-08-12 MED ORDER — ONDANSETRON HCL 4 MG/2ML IJ SOLN
INTRAMUSCULAR | Status: AC
  Filled 2019-08-12: qty 2

## 2019-08-12 MED ORDER — FENTANYL CITRATE (PF) 100 MCG/2ML IJ SOLN
INTRAMUSCULAR | Status: AC
  Filled 2019-08-12: qty 2

## 2019-08-12 MED ORDER — DEXAMETHASONE SODIUM PHOSPHATE 10 MG/ML IJ SOLN
INTRAMUSCULAR | Status: AC
  Filled 2019-08-12: qty 1

## 2019-08-12 MED ORDER — ONDANSETRON HCL 4 MG/2ML IJ SOLN
INTRAMUSCULAR | Status: DC | PRN
  Administered 2019-08-12: 4 mg via INTRAVENOUS

## 2019-08-12 MED ORDER — LIDOCAINE 2% (20 MG/ML) 5 ML SYRINGE
INTRAMUSCULAR | Status: DC | PRN
  Administered 2019-08-12: 60 mg via INTRAVENOUS

## 2019-08-12 MED ORDER — PROPOFOL 10 MG/ML IV BOLUS
INTRAVENOUS | Status: DC | PRN
  Administered 2019-08-12: 130 mg via INTRAVENOUS

## 2019-08-12 MED ORDER — FENTANYL CITRATE (PF) 250 MCG/5ML IJ SOLN
INTRAMUSCULAR | Status: DC | PRN
  Administered 2019-08-12 (×4): 50 ug via INTRAVENOUS

## 2019-08-12 MED ORDER — SUCCINYLCHOLINE CHLORIDE 200 MG/10ML IV SOSY
PREFILLED_SYRINGE | INTRAVENOUS | Status: AC
  Filled 2019-08-12: qty 10

## 2019-08-12 MED ORDER — CEFAZOLIN SODIUM-DEXTROSE 2-4 GM/100ML-% IV SOLN
INTRAVENOUS | Status: AC
  Filled 2019-08-12: qty 100

## 2019-08-12 MED ORDER — PHENYLEPHRINE 40 MCG/ML (10ML) SYRINGE FOR IV PUSH (FOR BLOOD PRESSURE SUPPORT)
PREFILLED_SYRINGE | INTRAVENOUS | Status: DC | PRN
  Administered 2019-08-12 (×2): 120 ug via INTRAVENOUS

## 2019-08-12 MED ORDER — BUPIVACAINE HCL 0.5 % IJ SOLN
INTRAMUSCULAR | Status: DC | PRN
  Administered 2019-08-12: 10 mL

## 2019-08-12 MED ORDER — PHENYLEPHRINE HCL-NACL 10-0.9 MG/250ML-% IV SOLN
INTRAVENOUS | Status: AC
  Filled 2019-08-12: qty 250

## 2019-08-12 MED ORDER — SODIUM CHLORIDE 0.9 % IR SOLN
Status: DC | PRN
  Administered 2019-08-12: 1000 mL

## 2019-08-12 MED ORDER — SUCCINYLCHOLINE CHLORIDE 200 MG/10ML IV SOSY
PREFILLED_SYRINGE | INTRAVENOUS | Status: DC | PRN
  Administered 2019-08-12: 200 mg via INTRAVENOUS

## 2019-08-12 MED ORDER — BUPIVACAINE-EPINEPHRINE 0.5% -1:200000 IJ SOLN
INTRAMUSCULAR | Status: AC
  Filled 2019-08-12: qty 1

## 2019-08-12 MED ORDER — CEFAZOLIN SODIUM-DEXTROSE 2-4 GM/100ML-% IV SOLN
2.0000 g | INTRAVENOUS | Status: AC
  Administered 2019-08-12: 16:00:00 2 g via INTRAVENOUS

## 2019-08-12 SURGICAL SUPPLY — 39 items
BLADE SURG 15 STRL LF DISP TIS (BLADE) IMPLANT
BLADE SURG 15 STRL SS (BLADE)
COAGULATOR SUCT 6 FR SWTCH (ELECTROSURGICAL) ×1
COAGULATOR SUCT SWTCH 10FR 6 (ELECTROSURGICAL) ×2 IMPLANT
CONT SPEC 4OZ CLIKSEAL STRL BL (MISCELLANEOUS) ×3 IMPLANT
COVER WAND RF STERILE (DRAPES) IMPLANT
DECANTER SPIKE VIAL GLASS SM (MISCELLANEOUS) ×3 IMPLANT
ELECT REM PT RETURN 9FT ADLT (ELECTROSURGICAL) ×3
ELECTRODE REM PT RTRN 9FT ADLT (ELECTROSURGICAL) ×1 IMPLANT
GAUZE 4X4 16PLY RFD (DISPOSABLE) ×3 IMPLANT
GAUZE PACKING 2X5 YD STRL (GAUZE/BANDAGES/DRESSINGS) ×3 IMPLANT
GAUZE SPONGE 4X4 12PLY STRL (GAUZE/BANDAGES/DRESSINGS) ×3 IMPLANT
GLOVE BIO SURGEON STRL SZ 6.5 (GLOVE) IMPLANT
GLOVE BIO SURGEON STRL SZ7.5 (GLOVE) ×9 IMPLANT
GLOVE BIO SURGEONS STRL SZ 6.5 (GLOVE)
HOOD PEEL AWAY FLYTE STAYCOOL (MISCELLANEOUS) ×3 IMPLANT
KIT BASIN OR (CUSTOM PROCEDURE TRAY) ×3 IMPLANT
KIT TURNOVER KIT A (KITS) IMPLANT
NEEDLE BLUNT 17GA (NEEDLE) IMPLANT
NEEDLE HYPO 22GX1.5 SAFETY (NEEDLE) ×6 IMPLANT
NEEDLE HYPO 25X1 1.5 SAFETY (NEEDLE) ×3 IMPLANT
NS IRRIG 1000ML POUR BTL (IV SOLUTION) ×3 IMPLANT
PACK EENT SPLIT (PACKS) ×3 IMPLANT
PACKING VAGINAL (PACKING) ×3 IMPLANT
PROTECTOR NERVE ULNAR (MISCELLANEOUS) ×3 IMPLANT
SUCTION FRAZIER HANDLE 10FR (MISCELLANEOUS) ×2
SUCTION TUBE FRAZIER 10FR DISP (MISCELLANEOUS) ×1 IMPLANT
SUT CHROMIC 3 0 PS 2 (SUTURE) IMPLANT
SUT CHROMIC 3 0 SH 27 (SUTURE) ×3 IMPLANT
SUT VIC AB 3-0 SH 27 (SUTURE) ×4
SUT VIC AB 3-0 SH 27X BRD (SUTURE) ×2 IMPLANT
SYR 50ML LL SCALE MARK (SYRINGE) IMPLANT
SYR BULB IRRIGATION 50ML (SYRINGE) ×3 IMPLANT
SYR CONTROL 10ML LL (SYRINGE) ×3 IMPLANT
TOWEL OR 17X26 10 PK STRL BLUE (TOWEL DISPOSABLE) ×3 IMPLANT
TUBING CONNECTING 10 (TUBING) ×2 IMPLANT
TUBING CONNECTING 10' (TUBING) ×1
WATER STERILE IRR 1000ML POUR (IV SOLUTION) IMPLANT
YANKAUER SUCT BULB TIP 10FT TU (MISCELLANEOUS) ×6 IMPLANT

## 2019-08-12 NOTE — Anesthesia Procedure Notes (Signed)
Procedure Name: Intubation Date/Time: 08/12/2019 3:24 PM Performed by: Mitzie Na, CRNA Pre-anesthesia Checklist: Patient identified, Emergency Drugs available, Suction available, Patient being monitored and Timeout performed Patient Re-evaluated:Patient Re-evaluated prior to induction Oxygen Delivery Method: Circle system utilized Preoxygenation: Pre-oxygenation with 100% oxygen Induction Type: IV induction, Rapid sequence and Cricoid Pressure applied Laryngoscope Size: Glidescope and 4 Grade View: Grade I Tube type: Oral Tube size: 7.0 mm Number of attempts: 1 Airway Equipment and Method: Video-laryngoscopy Placement Confirmation: ETT inserted through vocal cords under direct vision,  positive ETCO2 and breath sounds checked- equal and bilateral Secured at: 25 cm Tube secured with: Tape Dental Injury: Teeth and Oropharynx as per pre-operative assessment

## 2019-08-12 NOTE — TOC Progression Note (Signed)
Transition of Care (TOC) - Progression Note    Patient Details  Name: [facility] 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.         Expected Discharge Plan and Services           Expected Discharge Date: (unknown)                                     Social Determinants of Health (SDOH) Interventions    Readmission Risk Interventions Readmission Risk Prevention Plan 08/07/2019  Transportation Screening Complete  PCP or Specialist Appt within 3-5 Days Complete  HRI or [place] Not Complete  HRI or Home Care Consult comments To SNF  Social Work Consult for [place] Planning/Counseling Not Complete  SW consult not completed comments NA  Palliative Care Screening Not Applicable  Medication Review Press photographer) Complete  Some recent data might be hidden

## 2019-08-12 NOTE — Op Note (Addendum)
07/20/2019 - 08/12/2019  4:13 PM  PATIENT:  Louis Ford.  74 y.o. male  PRE-OPERATIVE DIAGNOSIS:  Persistent and intermittent ORAL BLEEDING  POST-OPERATIVE DIAGNOSIS:  SAME  PROCEDURE:  Procedure(s): debridement and resuture oral  incision  SURGEON:  Surgeon(s): [name], [name]  ANESTHESIA:   local and general  Findings: Hematoma anterior right mandible  EBL:  minimal  DRAINS: none   SPECIMEN:  No Specimen  COUNTS:  YES  PLAN OF CARE: Discharge to home after PACU  PATIENT DISPOSITION:  PACU - hemodynamically stable.   PROCEDURE DETAILS: Dictation # [id number]  [name], DMD 08/12/2019 4:13 PM

## 2019-08-12 NOTE — TOC Progression Note (Signed)
Transition of Care (TOC) - Progression Note    Patient Details  Name: [facility]. MRN: [id number] Date of Birth: Dec 29, 1945  Transition of Care [facility]) CM/SW Contact  Purcell Mouton, RN Phone Number: 08/12/2019, 3:25 PM  Clinical Narrative:    Pt's wife do not want him to go to [place] related to [place]. Wife asked for [facility]. However, pt is Positive for COVID. Wife is concerned if the test being positive is true and how he got COVID? Wife selected [place] at home.         Expected Discharge Plan and Services           Expected Discharge Date: (unknown)                                     Social Determinants of Health (SDOH) Interventions    Readmission Risk Interventions Readmission Risk Prevention Plan 08/07/2019  Transportation Screening Complete  PCP or Specialist Appt within 3-5 Days Complete  HRI or [place] Not Complete  HRI or Home Care Consult comments To SNF  Social Work Consult for [place] Planning/Counseling Not Complete  SW consult not completed comments NA  Palliative Care Screening Not Applicable  Medication Review Press photographer) Complete  Some recent data might be hidden

## 2019-08-12 NOTE — Anesthesia Preprocedure Evaluation (Addendum)
Anesthesia Evaluation  Patient identified by MRN, date of birth, ID band Patient awake    Reviewed: Allergy & Precautions, NPO status , Patient's Chart, lab work & pertinent test results, reviewed documented beta blocker date and time   Airway Mallampati: II  TM Distance: >3 FB Neck ROM: Full    Dental  (+) Edentulous Upper, Poor Dentition   Pulmonary former smoker,    Pulmonary exam normal breath sounds clear to auscultation       Cardiovascular hypertension, Pt. on medications and Pt. 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 There was mild concentric hypertrophy. Systolic function was normal. The estimated ejection fraction was in the range of 50% to 55%. Mild hypokinesis of the inferolateral myocardium. - Ventricular septum: Septal motion showed abnormal function and dyssynchrony. - Aortic valve: Transvalvular velocity was within the normal range.There was no stenosis. There was no regurgitation. - Mitral valve: Transvalvular velocity was within the normal range. There was no evidence for stenosis. There was mild regurgitation directed posteriorly. - Left atrium: The atrium was severely dilated. - Right ventricle: The cavity size was normal. Wall thickness was normal. Systolic function was normal. - Right atrium: The atrium was mildly dilated. - Tricuspid valve: There was mild regurgitation. - Pulmonary arteries: Systolic pressure was within the normal range. PA peak pressure: 32 mm Hg (S).  EKG 07/25/2019  NSR, incomplete LBBB pattern, LAD, prolonged QTc  Stress Test 2017 Nuclear stress EF: 39%. Inferior wall akinesis. Dilated left ventricle. Defect 1: There is a small defect of mild severity present in the apex location. There was  no ST segment deviation noted during stress. This is an intermediate risk study, based upon reduced ejection fraction. No ischemia identified.   Neuro/Psych Seizures -, Well Controlled,  CVA, Residual Symptoms negative psych ROS   GI/Hepatic Neg liver ROS, Oral wound   Endo/Other  Hyperlipidemia  Renal/GU Renal InsufficiencyRenal disease  negative genitourinary   Musculoskeletal  (+) Arthritis , Osteoarthritis,    Abdominal   Peds  Hematology  (+) Blood dyscrasia, anemia , Factor VII deficiency- but most recent test 07/22/2019 shows normal activity aPTT and PT both elevated INR normal   Anesthesia Other Findings   Reproductive/Obstetrics                             Anesthesia Physical  Anesthesia Plan  ASA: III and emergent  Anesthesia Plan: General   Post-op Pain Management:    Induction: Intravenous  PONV Risk Score and Plan: 3 and Ondansetron and Treatment may vary due to age or medical condition  Airway Management Planned: Oral ETT and Video Laryngoscope Planned  Additional Equipment:   Intra-op Plan:   Post-operative Plan: Extubation in OR  Informed Consent: I have reviewed the patients History and Physical, chart, labs and discussed the procedure including the risks, benefits and alternatives for the proposed anesthesia with the patient or authorized representative who has indicated his/her understanding and acceptance.     Dental advisory given  Plan Discussed with: CRNA and Surgeon  Anesthesia Plan Comments: (.)       Anesthesia Quick Evaluation

## 2019-08-12 NOTE — Progress Notes (Signed)
Hypoglycemic Event  CBG: 45  Treatment: 8 oz juice/soda  Symptoms: None  Follow-up CBG: [id number] CBG Result:73  Possible Reasons for Event: Unknown  Comments/MD notified: Elmer Bales

## 2019-08-12 NOTE — Progress Notes (Signed)
Brief Hematology Note:  I was notified by hospitalist that nursing is reported pulling clots out of the patient's mouth.  The patient was tested for COVID-19 pending transfer to a skilled nursing facility and was found to be positive.  Discussed with Dr. Irene Limbo.  Hemoglobin has remained stable. Factor VII levels and platelet function assay was within normal limits.  There was concern for possible C PVC of liver and possible related coagulopathy affecting PT and PTT.  Recommend continuing vitamin K 5 mg daily for at least 10 to 14 days.  Recommend Amicar soaks as previously ordered.  The patient has not always been compliant with this.  We will recheck a PT/INR and PTT today.  Please contact hematology for additional questions.  From our standpoint, the patient may be transferred to the [facility] if no dental intervention is recommended.  Mikey Bussing, DNP, AGPCNP-BC, AOCNP

## 2019-08-12 NOTE — Transfer of Care (Signed)
Immediate Anesthesia Transfer of Care Note  Patient: Louis Ford.  Procedure(s) Performed: CAUTERIZATION OF ORAL BLEEDING (N/A )  Patient Location: Nursing Unit  Anesthesia Type:General  Level of Consciousness: awake, alert , oriented and patient cooperative  Airway & Oxygen Therapy: Patient Spontanous Breathing and Patient connected to face mask oxygen  Post-op Assessment: Report given to RN, Post -op Vital signs reviewed and stable and Patient moving all extremities  Post vital signs: Reviewed and stable  Last Vitals:  Vitals Value Taken Time  BP 127/110 08/12/19 1727  Temp 36.3 C 08/12/19 1727  Pulse 95 08/12/19 1727  Resp    SpO2 100 % 08/12/19 1727    Last Pain:  Vitals:   08/12/19 1727  TempSrc: Oral  PainSc:       Patients Stated Pain Goal: 0 (09/38/18 2993)  Complications: No apparent anesthesia complications

## 2019-08-12 NOTE — Progress Notes (Signed)
Louis Ford  PROGRESS NOTE    [facility].  KZS:[id number] DOB: [birth year] DOA: 07/20/2019 PCP: [name], MD   Brief Narrative:   73 year old with past medical history significant for cardiovascular disease, chronic systolic heart failure ejection fraction 25%, status post AICD, chronic A. 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 Patient presented to the hospital on 7/27 with lightheadedness and dizziness after having tooth removed, bleeding from the mouth and unable to eat properly.  Per the patient he has not been on any blood thinner since he had retroperitoneal bleed about 2 months prior to admission.  Patient is able to walk with a walker.  Also has a history of seizure and is on multiple medications.  Patient had gone to the dentist and had almost all his teeth removed on 07/08/2019, following that he had continued bleeding into his mouth and he had talk to their dentist who recommended to control with gauze piece.  Patient has had poor oral intake.    Since hospitalization, patient has required occasional transfusion as well as FFP.  Also due to poor oral intake, has episode of hypoglycemia requiring D5 drip.  The morning 8/11 patient had another episode of oral bleeding through several pads, although hemoglobin has remained stable.  Patient's second COVID test was also negative.   Assessment & Plan:   Principal Problem:   Anemia due to blood loss, acute Active Problems:   HTN (hypertension)   Coronary artery disease involving native heart   Automatic implantable cardioverter-defibrillator in situ   Seizure (HCC)   Renal insufficiency   Factor VII deficiency (HCC)   Chronic systolic CHF (congestive heart failure), NYHA class 2 (HCC)   Chronic kidney disease (CKD), stage III (moderate) (HCC)   Oral bleeding   Coagulopathy ([place])   Hospice care patient   Palliative care patient   Complications due to automatic  implantable cardioverter-defibrillator   Anemia due to acute blood loss, symptomatic anemia:      - post operative dental bleeding.      - Patient has required blood transfusion during this admission due to dropping hemoglobin.     - He has also required FFP and vitamin K.     - Dr. [name] oral surgeon did suture of incision on 8/2; multiple interventions since; going to the OR again today     - He has a history of factor VII deficiency/ ?, hematology was consulted.  Work-up has been sent out.     - Need vitamin K for 10 to 14 days.     - Per hematology is some concern for possible C PVC of liver and possible related coagulopathy is recommending vitamin K.     - Patient also received Amicar; not compliant on it, needs to use it     - S/P 1 unit PRBC 8-14; Hgb is ok  Hypoglycemia:     - Secondary to poor oral intake ? Louis Ford     - Cortisol level normal at 10.  TSH mildly elevated.      - CBG capillary at 60 serum 100/.      - Appreciate Dr Buddy Duty help.  Will need to check protein C and insulin level if blood sugar venous sample less than 55.  So far patient has been above 60.      - Patient has been off of D5 IV fluids.  Encourage oral intake.  Hypotension     - post surgery  likely related to precedex.     - Patient became agitated after surgery in PACU. Was transiently on precex gtt. Subsequently became hypotensive SBP in the 70. Anesthesiology ordered IV bolus. SBP in the high 80.      - Patient also with spike after QRS, unclear if defibrillator is censing correctly. Cardiology has been consulted to evaluate rhythm.       - BP improved.   Possible Ventricular Undersensing;      - Appreciate Cardiology evaluation.      - Defibrillator was interrogated, defibrillator is working normally.  Constipation:      - Schedule Senokot.     - Dulcolax PRN  Mild hyperkalemia:     - Resolved after 2 doses of lokelma.   Dizziness lightheadedness:      - Likely related to blood loss.     -  Improved.   Chronic congestive heart failure status post AICD.     - Due to soft blood pressure, Aldactone, Coreg, nitrates and hydralazine has been on hold.     - Cardiology follow up.      - low-dose carvedilol.  Chronic A. fib: Continue with amiodarone.     - PRN labetalol.      - low-dose carvedilol.  History of seizure disorder:     - Continue with Depakote, Keppra , phenytoin.  Dementia without behavioral disturbance:      - Stable  Deconditioning:      - He needs PT rehab.     - Family does not want him going to SNF; says they want him home w/ HHPT  COVID+     - two tests earlier in this admission were negative; 8/18 test is positive     - ?false positive, have asked ID for input, appreciate assistance  Still with a good amount of bleeding this AM. Spoke with oral surgery. They will take him to OR today. Hgb is ok right now. Not compliant w/ amicar. Question if he is using gauze correctly. Spoke with hematology. Appreciate assistance. Now COVID19+. Spoke with ID about retesting. Continue precautions for now.   DVT prophylaxis: SCDs Code Status: FULL Family Communication: To wife by phone.   Disposition Plan: TBD  Consultants:   Oral Surgery  Hematology  Cardiology  Endocrine   ROS:  C/o oral bleeding. Denies CP, dyspnea, N, V. Remainder 10-pt ROS is negative for all not previously mentioned.  Subjective: "I give up."  Objective: Vitals:   08/11/19 1200 08/11/19 1440 08/11/19 2200 08/12/19 0500  BP: 108/73 106/73    Pulse:  97  84  Resp: 18 20 17    Temp:  98.4 F (36.9 C) 97.8 F (36.6 C) 97.6 F (36.4 C)  TempSrc:  Oral Axillary Axillary  SpO2:  100%    Weight:      Height:        Intake/Output Summary (Last 24 hours) at 08/12/2019 1459 Last data filed at 08/12/2019 8416 Gross per 24 hour  Intake 915.37 ml  Output 1300 ml  Net -384.63 ml   Filed Weights   08/03/19 6063 08/06/19 0432 08/10/19 0146  Weight: 96.7 kg 95.8 kg 95.9 kg     Examination:  General: 73 y.o. male resting in bed in NAD Eyes: PERRL, normal sclera ENMT: Nares patent w/o discharge, orophaynx clear, dentition normal, ears w/o discharge/lesions/ulcers Cardiovascular: RRR, +S1, S2, no m/g/r, equal pulses throughout Respiratory: CTABL, no w/r/r, normal WOB GI: BS+, NDNT, no masses noted, no organomegaly noted MSK:  No e/c/c Skin: No rashes, bruises, ulcerations noted Neuro: A&O x 3, no focal deficits Psyc: Appropriate interaction and affect, calm/cooperative   Data Reviewed: I have personally reviewed following labs and imaging studies.  CBC: Recent Labs  Lab 08/07/19 1612 08/08/19 0002 08/08/19 0545 08/08/19 1700 08/09/19 0436 08/10/19 0415 08/11/19 0449 08/12/19 0508  WBC 5.9 5.3 5.2  --  5.3 5.6  --  4.9  NEUTROABS 3.7 2.8  --   --   --   --   --   --   HGB 9.2* 8.9* 8.7* 9.3* 8.6* 8.8* 8.7* 8.7*  HCT 31.3* 28.6* 28.4* 30.1* 28.6* 28.8* 28.6* 28.9*  MCV 94.0 93.5 93.4  --  95.0 95.0  --  96.7  PLT 270 226 213  --  201 203  --  751   Basic Metabolic Panel: Recent Labs  Lab 08/07/19 0546  08/07/19 1615 08/08/19 0545 08/08/19 1700 08/09/19 0436 08/10/19 0415 08/12/19 0508  NA 135  --   --  135  --  136 137 138  K 5.5*  --   --  4.8  --  4.5 4.7 4.7  CL 108  --   --  108  --  107 107 113*  CO2 22  --   --  24  --  23 24 22   GLUCOSE 79   < > 95 89  --  92 79 76  BUN 12  --   --  12  --  10 10 9   CREATININE 1.01  --   --  1.06  --  0.90 0.98 0.88  CALCIUM 7.7*  --   --  7.8*  --  8.0* 7.9* 7.9*  MG  --   --   --   --  1.9  --   --   --    < > = values in this interval not displayed.   GFR: Estimated Creatinine Clearance: 92.3 mL/min (by C-G formula based on SCr of 0.88 mg/dL). Liver Function Tests: Recent Labs  Lab 08/06/19 0956  AST 31  ALT 14  ALKPHOS 81  BILITOT 0.6  PROT 6.9  ALBUMIN 3.0*   No results for input(s): LIPASE, AMYLASE in the last 168 hours. No results for input(s): AMMONIA in the last 168 hours.  Coagulation Profile: No results for input(s): INR, PROTIME in the last 168 hours. Cardiac Enzymes: No results for input(s): CKTOTAL, CKMB, CKMBINDEX, TROPONINI in the last 168 hours. BNP (last 3 results) Recent Labs    11/26/18 1025  PROBNP 56.0   HbA1C: No results for input(s): HGBA1C in the last 72 hours. CBG: Recent Labs  Lab 08/11/19 1603 08/12/19 0001 08/12/19 0821 08/12/19 0848 08/12/19 1314  GLUCAP 103* 74 45* 73 92   Lipid Profile: No results for input(s): CHOL, HDL, LDLCALC, TRIG, CHOLHDL, LDLDIRECT in the last 72 hours. Thyroid Function Tests: No results for input(s): TSH, T4TOTAL, FREET4, T3FREE, THYROIDAB in the last 72 hours. Anemia Panel: No results for input(s): VITAMINB12, FOLATE, FERRITIN, TIBC, IRON, RETICCTPCT in the last 72 hours. Sepsis Labs: No results for input(s): PROCALCITON, LATICACIDVEN in the last 168 hours.  Recent Results (from the past 240 hour(s))  SARS CORONAVIRUS 2 Nasal Swab Aptima Multi Swab     Status: None   Collection Time: 08/04/19  9:52 AM   Specimen: Aptima Multi Swab; Nasal Swab  Result Value Ref Range Status   SARS Coronavirus 2 NEGATIVE NEGATIVE Final    Comment: (NOTE) SARS-CoV-2 target nucleic acids are  NOT DETECTED. The SARS-CoV-2 RNA is generally detectable in upper and lower respiratory specimens during the acute phase of infection. Negative results do not preclude SARS-CoV-2 infection, do not rule out co-infections with other pathogens, and should not be used as the sole basis for treatment or other patient management decisions. Negative results must be combined with clinical observations, patient history, and epidemiological information. The expected result is Negative. Fact Sheet for Patients: SugarRoll.be Fact Sheet for Healthcare Providers: https://www.woods-mathews.com/ This test is not yet approved or cleared by the [place] FDA and  has been authorized for  detection and/or diagnosis of SARS-CoV-2 by FDA under an Emergency Use Authorization (EUA). This EUA will remain  in effect (meaning this test can be used) for the duration of the COVID-19 declaration under Section 56 4(b)(1) of the Act, 21 U.S.C. section 360bbb-3(b)(1), unless the authorization is terminated or revoked sooner. Performed at [facility], [place] [address] [place], [place] [address]   MRSA PCR Screening     Status: None   Collection Time: 08/08/19  7:01 AM   Specimen: Nasal Mucosa; Nasopharyngeal  Result Value Ref Range Status   MRSA by PCR NEGATIVE NEGATIVE Final    Comment:        The GeneXpert MRSA Assay (FDA approved for NASAL specimens only), is one component of a comprehensive MRSA colonization surveillance program. It is not intended to diagnose MRSA infection nor to guide or monitor treatment for MRSA infections. Performed at [facility], [place] [address], [place], [place] [address]   Novel Coronavirus, NAA (hospital order; send-out to ref lab)     Status: Abnormal   Collection Time: 08/11/19 11:17 AM   Specimen: Nasopharyngeal Swab; Respiratory  Result Value Ref Range Status   SARS-CoV-2, NAA DETECTED (A) NOT DETECTED Final    Comment: (NOTE)                  Client Requested Flag This test was developed and its performance characteristics determined by [employer]. This test has not been FDA cleared or approved. This test has been authorized by FDA under an Emergency Use Authorization (EUA). This test is only authorized for the duration of time the declaration that circumstances exist justifying the authorization of the emergency use of in vitro diagnostic tests for detection of SARS-CoV-2 virus and/or diagnosis of COVID-19 infection under section 564(b)(1) of the Act, 21 U.S.C. 277AJO-8(N)(8), unless the authorization is terminated or revoked sooner. When diagnostic testing is negative, the possibility of a false  negative result should be considered in the context of a patient's recent exposures and the presence of clinical signs and symptoms consistent with COVID-19. An individual without symptoms of COVID-19 and who is not shedding SARS-CoV-2 virus would expect to have a negative (not det ected) result in this assay. Performed At: [facility] [place], [place] [id number] Rush Farmer MD SJ:[id number]    [place]  Final    Comment: Performed at [place] [address], [place], [place] [address]      Radiology Studies: No results found.   Scheduled Meds: . sodium chloride   Intravenous Once  . sodium chloride   Intravenous Once  . amiodarone  200 mg Oral Daily  . carvedilol  3.125 mg Oral BID WC  . Chlorhexidine Gluconate Cloth  6 each Topical Daily  . divalproex  500 mg Oral Daily  . feeding supplement (ENSURE ENLIVE)  237 mL Oral TID BM  .  ferrous sulfate  325 mg Oral BID WC  . levETIRAcetam  1,000 mg Oral BID  .  morphine injection  2 mg Intravenous Once  . phenytoin  100 mg Oral BID  . phytonadione  5 mg Oral Daily  . senna-docusate  1 tablet Oral BID  . sodium chloride flush  10-40 mL Intracatheter Q12H  . vitamin B-12  100 mcg Oral Daily   Continuous Infusions: . sodium chloride 75 mL/hr at 08/12/19 0542  . [START ON 08/13/2019]  ceFAZolin (ANCEF) IV    . phenylephrine       LOS: 22 days    Time spent: 45 minutes spent in the coordination of care today.   Jonnie Finner, DO [facility] Pager [phone]  If 7PM-7AM, please contact night-coverage [email or web address] Password [facility] 08/12/2019, 2:59 PM

## 2019-08-12 NOTE — Progress Notes (Signed)
[facility]. PROGRESS NOTE:   SUBJECTIVE: Mouth started bleeding last night.  OBJECTIVE:  Vitals: Blood pressure 106/73, pulse 84, temperature 97.6 F (36.4 C), temperature source Axillary, resp. rate 17, height 5' 10.98" (1.803 m), weight 95.9 kg, SpO2 100 %. Lab results: Results for orders placed or performed during the hospital encounter of 07/20/19 (from the past 24 hour(s))  Glucose, capillary     Status: Abnormal   Collection Time: 08/11/19  4:03 PM  Result Value Ref Range   Glucose-Capillary 103 (H) 70 - 99 mg/dL  Glucose, capillary     Status: None   Collection Time: 08/12/19 12:01 AM  Result Value Ref Range   Glucose-Capillary 74 70 - 99 mg/dL  CBC     Status: Abnormal   Collection Time: 08/12/19  5:08 AM  Result Value Ref Range   WBC 4.9 4.0 - 10.5 K/uL   RBC 2.99 (L) 4.22 - 5.81 MIL/uL   Hemoglobin 8.7 (L) 13.0 - 17.0 g/dL   HCT 28.9 (L) 39.0 - 52.0 %   MCV 96.7 80.0 - 100.0 fL   MCH 29.1 26.0 - 34.0 pg   MCHC 30.1 30.0 - 36.0 g/dL   RDW 16.8 (H) 11.5 - 15.5 %   Platelets 210 150 - 400 K/uL   nRBC 0.6 (H) 0.0 - 0.2 %  Basic metabolic panel     Status: Abnormal   Collection Time: 08/12/19  5:08 AM  Result Value Ref Range   Sodium 138 135 - 145 mmol/L   Potassium 4.7 3.5 - 5.1 mmol/L   Chloride 113 (H) 98 - 111 mmol/L   CO2 22 22 - 32 mmol/L   Glucose, Bld 76 70 - 99 mg/dL   BUN 9 8 - 23 mg/dL   Creatinine, Ser 0.88 0.61 - 1.24 mg/dL   Calcium 7.9 (L) 8.9 - 10.3 mg/dL   GFR calc non Af Amer >60 >60 mL/min   GFR calc Af Amer >60 >60 mL/min   Anion gap 3 (L) 5 - 15  Glucose, capillary     Status: Abnormal   Collection Time: 08/12/19  8:21 AM  Result Value Ref Range   Glucose-Capillary 45 (L) 70 - 99 mg/dL  Glucose, capillary     Status: None   Collection Time: 08/12/19  8:48 AM  Result Value Ref Range   Glucose-Capillary 73 70 - 99 mg/dL  Glucose, capillary     Status: None   Collection Time: 08/12/19  1:14 PM  Result Value Ref Range   Glucose-Capillary 92 70 - 99 mg/dL   Radiology Results: No results found. General appearance: alert, cooperative and no distress Head: Normocephalic, without obvious abnormality, atraumatic Eyes: negative Nose: Nares normal. Septum midline. Mucosa normal. No drainage or sinus tenderness. Throat: moderate bleeding right anterior edentulous mandibular ridge. Lingual edema right anterior ridge. Tender to touch. No trismus. Pharynx clear. Neck: no adenopathy  ASSESSMENT: Recurrent intermittent oral bleeding from previous surgery site. Possible causes include irritation by oral gauze, oral hematoma, CPVC of liver.  PLAN: To OR for evaluation and cautery.   [name] 08/12/2019

## 2019-08-12 NOTE — Telephone Encounter (Signed)
Unable to reach pt . Left message and mailed letter

## 2019-08-12 NOTE — Anesthesia Postprocedure Evaluation (Signed)
Anesthesia Post Note  Patient: [facility].  Procedure(s) Performed: CAUTERIZATION OF ORAL BLEEDING (N/A )     Patient location: OR. Anesthesia Type: General Level of consciousness: awake and alert, oriented and patient cooperative Pain management: pain level controlled Vital Signs Assessment: post-procedure vital signs reviewed and stable Respiratory status: spontaneous breathing Cardiovascular status: blood pressure returned to baseline and stable Postop Assessment: no apparent nausea or vomiting Anesthetic complications: no    Last Vitals:  Vitals:   08/12/19 0500 08/12/19 1727  BP:  (!) 127/110  Pulse: 84 95  Resp:    Temp: 36.4 C (!) 36.3 C  SpO2:  100%    Last Pain:  Vitals:   08/12/19 1727  TempSrc: Oral  PainSc:                  Chelsey L Woodrum

## 2019-08-13 LAB — GLUCOSE, CAPILLARY
Glucose-Capillary: 74 mg/dL (ref 70–99)
Glucose-Capillary: 132 mg/dL — ABNORMAL HIGH (ref 70–99)
Glucose-Capillary: 91 mg/dL (ref 70–99)
Glucose-Capillary: 89 mg/dL (ref 70–99)

## 2019-08-13 LAB — CBC WITH DIFFERENTIAL/PLATELET
Abs Immature Granulocytes: 0.02 10*3/uL (ref 0.00–0.07)
Basophils Absolute: 0 10*3/uL (ref 0.0–0.1)
Basophils Relative: 0 %
Eosinophils Absolute: 0.4 10*3/uL (ref 0.0–0.5)
Eosinophils Relative: 6 %
HCT: 27.5 % — ABNORMAL LOW (ref 39.0–52.0)
Hemoglobin: 8.2 g/dL — ABNORMAL LOW (ref 13.0–17.0)
Immature Granulocytes: 0 %
Lymphocytes Relative: 13 %
Lymphs Abs: 0.8 10*3/uL (ref 0.7–4.0)
MCH: 29.2 pg (ref 26.0–34.0)
MCHC: 29.8 g/dL — ABNORMAL LOW (ref 30.0–36.0)
MCV: 97.9 fL (ref 80.0–100.0)
Monocytes Absolute: 0.8 10*3/uL (ref 0.1–1.0)
Monocytes Relative: 12 %
Neutro Abs: 4.2 10*3/uL (ref 1.7–7.7)
Neutrophils Relative %: 69 %
Platelets: 220 10*3/uL (ref 150–400)
RBC: 2.81 MIL/uL — ABNORMAL LOW (ref 4.22–5.81)
RDW: 17.2 % — ABNORMAL HIGH (ref 11.5–15.5)
WBC: 6.1 10*3/uL (ref 4.0–10.5)
nRBC: 0 % (ref 0.0–0.2)

## 2019-08-13 LAB — RENAL FUNCTION PANEL
Albumin: 2.7 g/dL — ABNORMAL LOW (ref 3.5–5.0)
Anion gap: 3 — ABNORMAL LOW (ref 5–15)
BUN: 11 mg/dL (ref 8–23)
CO2: 22 mmol/L (ref 22–32)
Calcium: 7.7 mg/dL — ABNORMAL LOW (ref 8.9–10.3)
Chloride: 111 mmol/L (ref 98–111)
Creatinine, Ser: 1.03 mg/dL (ref 0.61–1.24)
GFR calc Af Amer: >60 mL/min (ref >60)
GFR calc non Af Amer: >60 mL/min (ref >60)
Glucose, Bld: 93 mg/dL (ref 70–99)
Phosphorus: 4.1 mg/dL (ref 2.5–4.6)
Potassium: 4.9 mmol/L (ref 3.5–5.1)
Sodium: 136 mmol/L (ref 135–145)

## 2019-08-13 LAB — SARS CORONAVIRUS 2 BY RT PCR (HOSPITAL ORDER, PERFORMED IN ~~LOC~~ HOSPITAL LAB): SARS Coronavirus 2: NEGATIVE

## 2019-08-13 LAB — MAGNESIUM: Magnesium: 2 mg/dL (ref 1.7–2.4)

## 2019-08-13 NOTE — Progress Notes (Signed)
   08/13/19 2144  MEWS Score  Resp 20  MEWS Score  MEWS RR 0  MEWS Pulse 2  MEWS Systolic 0  MEWS LOC 0  MEWS Temp 0  MEWS Score 2  MEWS Score Color Yellow  MEWS Assessment  Is this an acute change? No  The attending provider is aware of the patient's heart rate Louis Ford I

## 2019-08-13 NOTE — Progress Notes (Signed)
[facility]. PROGRESS NOTE:   SUBJECTIVE: still bleeding  OBJECTIVE:  Vitals: Blood pressure 119/85, pulse (!) 118, temperature 98.2 F (36.8 C), temperature source Axillary, resp. rate 18, height 5' 10.98" (1.803 m), weight 95.9 kg, SpO2 99 %. Lab results: Results for orders placed or performed during the hospital encounter of 07/20/19 (from the past 24 hour(s))  Glucose, capillary     Status: None   Collection Time: 08/12/19  1:14 PM  Result Value Ref Range   Glucose-Capillary 92 70 - 99 mg/dL  Glucose, capillary     Status: Abnormal   Collection Time: 08/12/19  5:23 PM  Result Value Ref Range   Glucose-Capillary 127 (H) 70 - 99 mg/dL  APTT     Status: Abnormal   Collection Time: 08/12/19  6:24 PM  Result Value Ref Range   aPTT 45 (H) 24 - 36 seconds  Protime-INR     Status: Abnormal   Collection Time: 08/12/19  6:24 PM  Result Value Ref Range   Prothrombin Time 17.3 (H) 11.4 - 15.2 seconds   INR 1.4 (H) 0.8 - 1.2  Glucose, capillary     Status: Abnormal   Collection Time: 08/12/19  8:32 PM  Result Value Ref Range   Glucose-Capillary 104 (H) 70 - 99 mg/dL  CBC with Differential/Platelet     Status: Abnormal   Collection Time: 08/13/19  4:39 AM  Result Value Ref Range   WBC 6.1 4.0 - 10.5 K/uL   RBC 2.81 (L) 4.22 - 5.81 MIL/uL   Hemoglobin 8.2 (L) 13.0 - 17.0 g/dL   HCT 27.5 (L) 39.0 - 52.0 %   MCV 97.9 80.0 - 100.0 fL   MCH 29.2 26.0 - 34.0 pg   MCHC 29.8 (L) 30.0 - 36.0 g/dL   RDW 17.2 (H) 11.5 - 15.5 %   Platelets 220 150 - 400 K/uL   nRBC 0.0 0.0 - 0.2 %   Neutrophils Relative % 69 %   Neutro Abs 4.2 1.7 - 7.7 K/uL   Lymphocytes Relative 13 %   Lymphs Abs 0.8 0.7 - 4.0 K/uL   Monocytes Relative 12 %   Monocytes Absolute 0.8 0.1 - 1.0 K/uL   Eosinophils Relative 6 %   Eosinophils Absolute 0.4 0.0 - 0.5 K/uL   Basophils Relative 0 %   Basophils Absolute 0.0 0.0 - 0.1 K/uL   Immature Granulocytes 0 %   Abs Immature Granulocytes 0.02 0.00 - 0.07 K/uL   Magnesium     Status: None   Collection Time: 08/13/19  4:39 AM  Result Value Ref Range   Magnesium 2.0 1.7 - 2.4 mg/dL  Renal function panel     Status: Abnormal   Collection Time: 08/13/19  4:39 AM  Result Value Ref Range   Sodium 136 135 - 145 mmol/L   Potassium 4.9 3.5 - 5.1 mmol/L   Chloride 111 98 - 111 mmol/L   CO2 22 22 - 32 mmol/L   Glucose, Bld 93 70 - 99 mg/dL   BUN 11 8 - 23 mg/dL   Creatinine, Ser 1.03 0.61 - 1.24 mg/dL   Calcium 7.7 (L) 8.9 - 10.3 mg/dL   Phosphorus 4.1 2.5 - 4.6 mg/dL   Albumin 2.7 (L) 3.5 - 5.0 g/dL   GFR calc non Af Amer >60 >60 mL/min   GFR calc Af Amer >60 >60 mL/min   Anion gap 3 (L) 5 - 15  Glucose, capillary     Status: None   Collection Time:  08/13/19  9:09 AM  Result Value Ref Range   Glucose-Capillary 74 70 - 99 mg/dL  Glucose, capillary     Status: Abnormal   Collection Time: 08/13/19 12:17 PM  Result Value Ref Range   Glucose-Capillary 132 (H) 70 - 99 mg/dL   Radiology Results: No results found. General appearance: alert, cooperative and no distress Head: Normocephalic, without obvious abnormality, atraumatic Eyes: negative Nose: Nares normal. Septum midline. Mucosa normal. No drainage or sinus tenderness. Throat: mild oozing right mandibular ridge canine area. Sutures intact. Mild edema, ecchymosis anterior mucosa. Patient biting on gauze.  Neck: no adenopathy  ASSESSMENT: Slight oozing right anterior mandible s/p re-suture area x 3.   PLAN: Continue current plan. If bleeding persists, suggest heme/onc re-evaluation.   [name] 08/13/2019

## 2019-08-13 NOTE — Progress Notes (Signed)
   Vital Signs MEWS/VS Documentation      08/12/2019 2034 08/12/2019 2220 08/13/2019 0648 08/13/2019 0920   MEWS Score:  2  2  2  2    MEWS Score Color:  Yellow  Yellow  Yellow  Yellow   Resp:  18  -  18  -   Pulse:  (!) 111  -  (!) 118  (!) 118   BP:  (!) 115/91  -  (!) 130/95  119/85   Temp:  (!) 97.5 F (36.4 C)  -  98.2 F (36.8 C)  98.2 F (36.8 C)   O2 Device:  Room Air  -  Room Air  -   Level of Consciousness:  -  Alert  -  -        MD made aware of MEWS score.   Timia Casselman A 08/13/2019,9:41 AM

## 2019-08-13 NOTE — Op Note (Signed)
NAME: Louis Ford, Ladnier MEDICAL RECORD [id number] ACCOUNT [id number] DATE OF BIRTH:23-Jan-1946 FACILITY: [facility] LOCATION: [id number] PHYSICIAN:Khamil Lamica M. Annye Forrey, [name]  OPERATIVE REPORT  DATE OF PROCEDURE:  08/12/2019  PREOPERATIVE DIAGNOSIS:  Persistent intermittent oral bleeding.  POSTOPERATIVE DIAGNOSIS:  Persistent intermittent oral bleeding.  PROCEDURE:  Debridement and resuture oral incisions.  SURGEON:  [name], [name]  ANESTHESIA:  General.  ATTENDING:  Woodrum, oral intubation.  DESCRIPTION OF PROCEDURE:  The patient was taken to the operating room and placed on the table in supine position.  General anesthesia was administered intravenously, and an oral endotracheal tube was placed and secured.  The eyes were protected and the  patient was draped for surgery.  A timeout was performed.  The posterior pharynx was suctioned with Yankauer suction, and a throat pack was placed.  0.5% Marcaine with 1:200,000 epinephrine was infiltrated in an inferior alveolar block on the right side  and in buccal and lingual infiltration in the anterior mandible.  The anterior right mandible was inspected and found to have mild to moderate bleeding in the canine area on the right and left side.  There was mild lingual edema as well, and upon  palpation, blood started out through the canine areas.  The sutures that had been placed previously, 3-0 chromic, were cut and removed.  The interior of the soft tissue contained immature clots with no active bleeding.  A rongeur was used to debride this  area, and then releasing incisions were made with curved scissors along the length of the incision to allow for primary closure.  The cautery was used to cauterize any small bleeding vessels, and then 3-0 Vicryl was used in horizontal mattress sutures  to close primarily the incision area.  The Bovie cautery was then used to cauterize any small bleeding vessels on the lingual and lateral surfaces.  The oral cavity  was then irrigated and suctioned.  The throat pack was removed.  The patient was left in  the care of Anesthesia for transport to recovery room and to the floor.  ESTIMATED BLOOD LOSS:  Minimal.  COMPLICATIONS:  None.  SPECIMENS:  None.  LN/NUANCE  D:08/12/2019 T:08/13/2019 JOB:[id number]

## 2019-08-13 NOTE — Progress Notes (Signed)
PT Cancellation Note  Patient Details Name: [facility]. MRN: [id number] DOB: 01-03-1946   Cancelled Treatment:    Reason Eval/Treat Not Completed: Patient declined, no reason specified Pt adamantly refused any PT today.  Pt states "I tested positive, and I didn't ask for this disease."  Pt encouraged to mobilize to assist with recovery however he again stated, "No offense to you, but I'm not doing it."  Respected pt wishes at this time and left room.     Riverlyn Kizziah,KATHrine E 08/13/2019, 2:26 PM Carmelia Bake, PT, DPT Acute Rehabilitation Services Office: [phone] Pager: [phone]

## 2019-08-13 NOTE — Progress Notes (Signed)
Louis Ford  PROGRESS NOTE    [facility].  OEV:[id number] DOB: 04-09-1946 DOA: 07/20/2019 PCP: [name], MD   Brief Narrative:   73 year old with past medical history significant for cardiovascular disease, chronic systolic heart failure ejection fraction 25%, status post AICD, chronic A. 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 Patient presented to the hospital on 7/27 with lightheadedness and dizziness after having tooth removed, bleeding from the mouth and unable to eat properly. Per the patient he has not been on any blood thinner since he had retroperitoneal bleed about 2 months prior to admission. Patient is able to walk with a walker. Also has a history of seizure and is on multiple medications.  Patient had gone to the dentist and had almost all his teeth removed on 07/08/2019, following that he had continued bleeding into his mouth and he had talk to their dentist who recommended to control with gauze piece. Patient has had poor oral intake.   Since hospitalization, patient has required occasional transfusion as well as FFP. Also due to poor oral intake, has episode of hypoglycemia requiring D5 drip. The morning 8/11 patient had another episode of oral bleeding through several pads, although hemoglobin has remained stable. Patient's second COVID test was also negative.   Assessment & Plan:   Principal Problem:   Anemia due to blood loss, acute Active Problems:   HTN (hypertension)   Coronary artery disease involving native heart   Automatic implantable cardioverter-defibrillator in situ   Seizure (HCC)   Renal insufficiency   Factor VII deficiency (HCC)   Chronic systolic CHF (congestive heart failure), NYHA class 2 (HCC)   Chronic kidney disease (CKD), stage III (moderate) (HCC)   Oral bleeding   Coagulopathy ([place])   Hospice care patient   Palliative care patient   Complications due to automatic  implantable cardioverter-defibrillator   Anemia due to acute blood loss, symptomatic anemia:      - post operative dental bleeding.      - Patient has required blood transfusion during this admission due to dropping hemoglobin.     - He has also required FFP and vitamin K.     - Dr. [name] oral surgeon did suture of incision on 8/2; multiple interventions since; going to the OR again today     - He has a history of factor VII deficiency/ ?, hematology was consulted.  Work-up has been sent out.     - Need vitamin K for 10 to 14 days.     - Per hematology is some concern for possible C PVC of liver and possible related coagulopathy is recommending vitamin K.     - Patient also received Amicar; not compliant on it, needs to use it     - S/P 1 unit PRBC 8-14; Hgb is ok     - went to OR for hemostasis yesterday; bleeding improved today; appreciate oral surgery assistance  Hypoglycemia:     - Secondary to poor oral intake ? Louis Ford     - Cortisol level normal at 10.  TSH mildly elevated.      - CBG capillary at 60 serum 100/.      - Appreciate Dr Buddy Duty help.  Will need to check protein C and insulin level if blood sugar venous sample less than 55.  So far patient has been above 60.      - Patient has been off of D5 IV fluids.  Encourage oral  intake.  Hypotension     - post surgery likely related to precedex.     - Patient became agitated after surgery in PACU. Was transiently on precex gtt. Subsequently became hypotensive SBP in the 70. Anesthesiology ordered IV bolus. SBP in the high 80.      - Patient also with spike after QRS, unclear if defibrillator is censing correctly. Cardiology has been consulted to evaluate rhythm.       - BP improved.   Possible Ventricular Undersensing;      - Appreciate Cardiology evaluation.      - Defibrillator was interrogated, defibrillator is working normally.  Constipation:      - Schedule Senokot.     - Dulcolax PRN  Mild hyperkalemia:     - Resolved  after 2 doses of lokelma.   Dizziness lightheadedness:      - Likely related to blood loss.     - Improved.   Chronic congestive heart failure status post AICD.     - Due to soft blood pressure, Aldactone, Coreg, nitrates and hydralazine has been on hold.     - Cardiology follow up.      - low-dose carvedilol.  Chronic A. fib: Continue with amiodarone.     - PRN labetalol.      - low-dose carvedilol.  History of seizure disorder:     - Continue with Depakote, Keppra , phenytoin.  Dementia without behavioral disturbance:      - Stable  Deconditioning:      - He needs PT rehab.     - Family does not want him going to SNF; says they want him home w/ HHPT  COVID+     - two tests earlier in this admission were negative; 8/18 test is positive     - ?false positive, have asked ID for input, appreciate assistance     - ID request repeat in-house COVID test; it is negative; awaiting recs  Bleeding improved this AM. Repeat in-house COVID is negative. Awaiting ID recs. Family does not want SNF. CM working on [place]. Otherwise, continue as above.   DVT prophylaxis: SCDs Code Status: FULL   Disposition Plan: TBD  Consultants:   Oral Surgery  Hematology  Cardiology  Endocrine   ROS:  Says oral bleeding improved. Denies CP, dyspnea, N, V. Remainder 10-pt ROS is negative for all not previously mentioned.  Subjective: "I guess it's better."  Objective: Vitals:   08/12/19 2034 08/13/19 0648 08/13/19 0920 08/13/19 1242  BP: (!) 115/91 (!) 130/95 119/85 107/67  Pulse: (!) 111 (!) 118 (!) 118 (!) 120  Resp: 18 18  19   Temp: (!) 97.5 F (36.4 C) 98.2 F (36.8 C) 98.2 F (36.8 C) 98.4 F (36.9 C)  TempSrc: Oral  Axillary Oral  SpO2: 100% 98% 99% 100%  Weight:      Height:        Intake/Output Summary (Last 24 hours) at 08/13/2019 1710 Last data filed at 08/13/2019 1244 Gross per 24 hour  Intake 840 ml  Output 775 ml  Net 65 ml   Filed Weights   08/03/19 4081  08/06/19 0432 08/10/19 0146  Weight: 96.7 kg 95.8 kg 95.9 kg    Examination:  General: 73 y.o. male resting in bed in NAD Cardiovascular: RRR, +S1, S2, no m/g/r, equal pulses throughout Respiratory: CTABL, no w/r/r, normal WOB GI: BS+, NDNT, no masses noted, no organomegaly noted MSK: No e/c/c Skin: No rashes, bruises, ulcerations noted Neuro: A&O x  3, no focal deficits Psyc: Appropriate interaction and affect, calm/cooperative   Data Reviewed: I have personally reviewed following labs and imaging studies.  CBC: Recent Labs  Lab 08/07/19 1612 08/08/19 0002 08/08/19 0545  08/09/19 0436 08/10/19 0415 08/11/19 0449 08/12/19 0508 08/13/19 0439  WBC 5.9 5.3 5.2  --  5.3 5.6  --  4.9 6.1  NEUTROABS 3.7 2.8  --   --   --   --   --   --  4.2  HGB 9.2* 8.9* 8.7*   < > 8.6* 8.8* 8.7* 8.7* 8.2*  HCT 31.3* 28.6* 28.4*   < > 28.6* 28.8* 28.6* 28.9* 27.5*  MCV 94.0 93.5 93.4  --  95.0 95.0  --  96.7 97.9  PLT 270 226 213  --  201 203  --  210 220   < > = values in this interval not displayed.   Basic Metabolic Panel: Recent Labs  Lab 08/08/19 0545 08/08/19 1700 08/09/19 0436 08/10/19 0415 08/12/19 0508 08/13/19 0439  NA 135  --  136 137 138 136  K 4.8  --  4.5 4.7 4.7 4.9  CL 108  --  107 107 113* 111  CO2 24  --  23 24 22 22   GLUCOSE 89  --  92 79 76 93  BUN 12  --  10 10 9 11   CREATININE 1.06  --  0.90 0.98 0.88 1.03  CALCIUM 7.8*  --  8.0* 7.9* 7.9* 7.7*  MG  --  1.9  --   --   --  2.0  PHOS  --   --   --   --   --  4.1   GFR: Estimated Creatinine Clearance: 78.8 mL/min (by C-G formula based on SCr of 1.03 mg/dL). Liver Function Tests: Recent Labs  Lab 08/13/19 0439  ALBUMIN 2.7*   No results for input(s): LIPASE, AMYLASE in the last 168 hours. No results for input(s): AMMONIA in the last 168 hours. Coagulation Profile: Recent Labs  Lab 08/12/19 1824  INR 1.4*   Cardiac Enzymes: No results for input(s): CKTOTAL, CKMB, CKMBINDEX, TROPONINI in the last 168  hours. BNP (last 3 results) Recent Labs    11/26/18 1025  PROBNP 56.0   HbA1C: No results for input(s): HGBA1C in the last 72 hours. CBG: Recent Labs  Lab 08/12/19 1314 08/12/19 1723 08/12/19 2032 08/13/19 0909 08/13/19 1217  GLUCAP 92 127* 104* 74 132*   Lipid Profile: No results for input(s): CHOL, HDL, LDLCALC, TRIG, CHOLHDL, LDLDIRECT in the last 72 hours. Thyroid Function Tests: No results for input(s): TSH, T4TOTAL, FREET4, T3FREE, THYROIDAB in the last 72 hours. Anemia Panel: No results for input(s): VITAMINB12, FOLATE, FERRITIN, TIBC, IRON, RETICCTPCT in the last 72 hours. Sepsis Labs: No results for input(s): PROCALCITON, LATICACIDVEN in the last 168 hours.  Recent Results (from the past 240 hour(s))  SARS CORONAVIRUS 2 Nasal Swab Aptima Multi Swab     Status: None   Collection Time: 08/04/19  9:52 AM   Specimen: Aptima Multi Swab; Nasal Swab  Result Value Ref Range Status   SARS Coronavirus 2 NEGATIVE NEGATIVE Final    Comment: (NOTE) SARS-CoV-2 target nucleic acids are NOT DETECTED. The SARS-CoV-2 RNA is generally detectable in upper and lower respiratory specimens during the acute phase of infection. Negative results do not preclude SARS-CoV-2 infection, do not rule out co-infections with other pathogens, and should not be used as the sole basis for treatment or other patient management decisions. Negative results  must be combined with clinical observations, patient history, and epidemiological information. The expected result is Negative. Fact Sheet for Patients: SugarRoll.be Fact Sheet for Healthcare Providers: https://www.woods-mathews.com/ This test is not yet approved or cleared by the [place] FDA and  has been authorized for detection and/or diagnosis of SARS-CoV-2 by FDA under an Emergency Use Authorization (EUA). This EUA will remain  in effect (meaning this test can be used) for the duration of the  COVID-19 declaration under Section 56 4(b)(1) of the Act, 21 U.S.C. section 360bbb-3(b)(1), unless the authorization is terminated or revoked sooner. Performed at [facility], [place] [address] [place], [place] [address]   MRSA PCR Screening     Status: None   Collection Time: 08/08/19  7:01 AM   Specimen: Nasal Mucosa; Nasopharyngeal  Result Value Ref Range Status   MRSA by PCR NEGATIVE NEGATIVE Final    Comment:        The GeneXpert MRSA Assay (FDA approved for NASAL specimens only), is one component of a comprehensive MRSA colonization surveillance program. It is not intended to diagnose MRSA infection nor to guide or monitor treatment for MRSA infections. Performed at [facility], [place] [address], [place], [place] [address]   Novel Coronavirus, NAA (hospital order; send-out to ref lab)     Status: Abnormal   Collection Time: 08/11/19 11:17 AM   Specimen: Nasopharyngeal Swab; Respiratory  Result Value Ref Range Status   SARS-CoV-2, NAA DETECTED (A) NOT DETECTED Final    Comment: (NOTE)                  Client Requested Flag This test was developed and its performance characteristics determined by [employer]. This test has not been FDA cleared or approved. This test has been authorized by FDA under an Emergency Use Authorization (EUA). This test is only authorized for the duration of time the declaration that circumstances exist justifying the authorization of the emergency use of in vitro diagnostic tests for detection of SARS-CoV-2 virus and/or diagnosis of COVID-19 infection under section 564(b)(1) of the Act, 21 U.S.C. 102VOZ-3(G)(6), unless the authorization is terminated or revoked sooner. When diagnostic testing is negative, the possibility of a false negative result should be considered in the context of a patient's recent exposures and the presence of clinical signs and symptoms consistent with COVID-19. An individual without  symptoms of COVID-19 and who is not shedding SARS-CoV-2 virus would expect to have a negative (not det ected) result in this assay. Performed At: [facility] [place], [place] [id number] Rush Farmer MD ZD:[id number]    [place]  Final    Comment: Performed at [place] [address], [place], [place] [address]  SARS Coronavirus 2 [facility] order, Performed in [facility] hospital lab) Nasopharyngeal Nasopharyngeal Swab     Status: None   Collection Time: 08/13/19  3:42 PM   Specimen: Nasopharyngeal Swab  Result Value Ref Range Status   SARS Coronavirus 2 NEGATIVE NEGATIVE Final    Comment: (NOTE) If result is NEGATIVE SARS-CoV-2 target nucleic acids are NOT DETECTED. The SARS-CoV-2 RNA is generally detectable in upper and lower  respiratory specimens during the acute phase of infection. The lowest  concentration of SARS-CoV-2 viral copies this assay can detect is 250  copies / mL. A negative result does not preclude SARS-CoV-2 infection  and should not be used as the sole basis for treatment or other  patient management decisions.  A  negative result may occur with  improper specimen collection / handling, submission of specimen other  than nasopharyngeal swab, presence of viral mutation(s) within the  areas targeted by this assay, and inadequate number of viral copies  (<250 copies / mL). A negative result must be combined with clinical  observations, patient history, and epidemiological information. If result is POSITIVE SARS-CoV-2 target nucleic acids are DETECTED. The SARS-CoV-2 RNA is generally detectable in upper and lower  respiratory specimens dur ing the acute phase of infection.  Positive  results are indicative of active infection with SARS-CoV-2.  Clinical  correlation with patient history and other diagnostic information is  necessary to determine patient infection status.  Positive results do   not rule out bacterial infection or co-infection with other viruses. If result is PRESUMPTIVE POSTIVE SARS-CoV-2 nucleic acids MAY BE PRESENT.   A presumptive positive result was obtained on the submitted specimen  and confirmed on repeat testing.  While 2019 novel coronavirus  (SARS-CoV-2) nucleic acids may be present in the submitted sample  additional confirmatory testing may be necessary for epidemiological  and / or clinical management purposes  to differentiate between  SARS-CoV-2 and other Sarbecovirus currently known to infect humans.  If clinically indicated additional testing with an alternate test  methodology [phone]) is advised. The SARS-CoV-2 RNA is generally  detectable in upper and lower respiratory sp ecimens during the acute  phase of infection. The expected result is Negative. Fact Sheet for Patients:  StrictlyIdeas.no Fact Sheet for Healthcare Providers: BankingDealers.co.za This test is not yet approved or cleared by the [place] FDA and has been authorized for detection and/or diagnosis of SARS-CoV-2 by FDA under an Emergency Use Authorization (EUA).  This EUA will remain in effect (meaning this test can be used) for the duration of the COVID-19 declaration under Section 564(b)(1) of the Act, 21 U.S.C. section 360bbb-3(b)(1), unless the authorization is terminated or revoked sooner. Performed at [facility], [place] [address], [place], [place] [address]       Radiology Studies: No results found.   Scheduled Meds: . sodium chloride   Intravenous Once  . sodium chloride   Intravenous Once  . amiodarone  200 mg Oral Daily  . carvedilol  3.125 mg Oral BID WC  . Chlorhexidine Gluconate Cloth  6 each Topical Daily  . divalproex  500 mg Oral Daily  . feeding supplement (ENSURE ENLIVE)  237 mL Oral TID BM  . ferrous sulfate  325 mg Oral BID WC  . levETIRAcetam  1,000 mg Oral BID  .   morphine injection  2 mg Intravenous Once  . phenytoin  100 mg Oral BID  . phytonadione  5 mg Oral Daily  . senna-docusate  1 tablet Oral BID  . sodium chloride flush  10-40 mL Intracatheter Q12H  . vitamin B-12  100 mcg Oral Daily   Continuous Infusions: . sodium chloride 75 mL/hr at 08/12/19 0542     LOS: 23 days    Time spent: 25 minutes spent in the coordination of care today.   Jonnie Finner, DO [facility] Pager [phone]  If 7PM-7AM, please contact night-coverage [email or web address] Password [email or web address] 08/13/2019, 5:10 PM

## 2019-08-14 ENCOUNTER — Encounter (HOSPITAL_COMMUNITY): Payer: Self-pay | Admitting: Oral Surgery

## 2019-08-14 LAB — CBC WITH DIFFERENTIAL/PLATELET
Abs Immature Granulocytes: 0.02 10*3/uL (ref 0.00–0.07)
Basophils Absolute: 0 10*3/uL (ref 0.0–0.1)
Basophils Relative: 0 %
Eosinophils Absolute: 0.5 10*3/uL (ref 0.0–0.5)
Eosinophils Relative: 10 %
HCT: 25.6 % — ABNORMAL LOW (ref 39.0–52.0)
Hemoglobin: 7.7 g/dL — ABNORMAL LOW (ref 13.0–17.0)
Immature Granulocytes: 0 %
Lymphocytes Relative: 18 %
Lymphs Abs: 0.9 10*3/uL (ref 0.7–4.0)
MCH: 29.5 pg (ref 26.0–34.0)
MCHC: 30.1 g/dL (ref 30.0–36.0)
MCV: 98.1 fL (ref 80.0–100.0)
Monocytes Absolute: 0.7 10*3/uL (ref 0.1–1.0)
Monocytes Relative: 15 %
Neutro Abs: 2.7 10*3/uL (ref 1.7–7.7)
Neutrophils Relative %: 57 %
Platelets: 185 10*3/uL (ref 150–400)
RBC: 2.61 MIL/uL — ABNORMAL LOW (ref 4.22–5.81)
RDW: 17.4 % — ABNORMAL HIGH (ref 11.5–15.5)
WBC: 4.8 10*3/uL (ref 4.0–10.5)
nRBC: 0.4 % — ABNORMAL HIGH (ref 0.0–0.2)

## 2019-08-14 LAB — GLUCOSE, CAPILLARY
Glucose-Capillary: 53 mg/dL — ABNORMAL LOW (ref 70–99)
Glucose-Capillary: 85 mg/dL (ref 70–99)
Glucose-Capillary: 67 mg/dL — ABNORMAL LOW (ref 70–99)
Glucose-Capillary: 101 mg/dL — ABNORMAL HIGH (ref 70–99)
Glucose-Capillary: 98 mg/dL (ref 70–99)

## 2019-08-14 LAB — RENAL FUNCTION PANEL
Albumin: 2.4 g/dL — ABNORMAL LOW (ref 3.5–5.0)
Anion gap: 3 — ABNORMAL LOW (ref 5–15)
BUN: 12 mg/dL (ref 8–23)
CO2: 23 mmol/L (ref 22–32)
Calcium: 7.9 mg/dL — ABNORMAL LOW (ref 8.9–10.3)
Chloride: 113 mmol/L — ABNORMAL HIGH (ref 98–111)
Creatinine, Ser: 1.05 mg/dL (ref 0.61–1.24)
GFR calc Af Amer: >60 mL/min (ref >60)
GFR calc non Af Amer: >60 mL/min (ref >60)
Glucose, Bld: 113 mg/dL — ABNORMAL HIGH (ref 70–99)
Phosphorus: 3.5 mg/dL (ref 2.5–4.6)
Potassium: 4.7 mmol/L (ref 3.5–5.1)
Sodium: 139 mmol/L (ref 135–145)

## 2019-08-14 LAB — MAGNESIUM: Magnesium: 1.8 mg/dL (ref 1.7–2.4)

## 2019-08-14 MED ORDER — DEXTROSE 50 % IV SOLN
INTRAVENOUS | Status: AC
  Administered 2019-08-14: 10:00:00
  Filled 2019-08-14: qty 50

## 2019-08-14 NOTE — Progress Notes (Addendum)
Louis Ford  PROGRESS NOTE    [facility]  [id number] DOB: 07/08/1946 DOA: 07/20/2019 PCP: [name], MD   Brief Narrative:   73 year old with past medical history significant for cardiovascular disease, chronic systolic heart failure ejection fraction 25%, status post AICD, chronic A. 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 Patient presented to the hospital on 7/27 with lightheadedness and dizziness after having tooth removed, bleeding from the mouth and unable to eat properly. Per the patient he has not been on any blood thinner since he had retroperitoneal bleed about 2 months prior to admission. Patient is able to walk with a walker. Also has a history of seizure and is on multiple medications.  Patient had gone to the dentist and had almost all his teeth removed on 07/08/2019, following that he had continued bleeding into his mouth and he had talk to their dentist who recommended to control with gauze piece. Patient has had poor oral intake.   Since hospitalization, patient has required occasional transfusion as well as FFP. Also due to poor oral intake, has episode of hypoglycemia requiring D5 drip. The morning 8/11 patient had another episode of oral bleeding through several pads, although hemoglobin has remained stable. Patient's second COVID test was also negative.   Assessment & Plan:   Principal Problem:   Anemia due to blood loss, acute Active Problems:   HTN (hypertension)   Coronary artery disease involving native heart   Automatic implantable cardioverter-defibrillator in situ   Seizure (HCC)   Renal insufficiency   Factor VII deficiency (HCC)   Chronic systolic CHF (congestive heart failure), NYHA class 2 (HCC)   Chronic kidney disease (CKD), stage III (moderate) (HCC)   Oral bleeding   Coagulopathy ([place])   Hospice care patient   Palliative care patient   Complications due to automatic  implantable cardioverter-defibrillator   Anemia due to acute blood loss, symptomatic anemia:  - post operative dental bleeding.  - Patient has required blood transfusion during this admission due to dropping hemoglobin. - He has also required FFP and vitamin K. - Dr. [name] oral surgeon did suture of incision on 8/2; multiple interventions since; going to the OR again today - He has a history of factor VII deficiency/ ?, hematology was consulted. Work-up has been sent out. - Need vitamin K for 10 to 14 days. - Per hematology is some concern for possible C PVC of liver and possible related coagulopathy is recommending vitamin K. - Patient also received Amicar; not compliant on it, needs to use it - S/P 1 unit PRBC 8-14; Hgb is ok     - went to OR for hemostasis yesterday; bleeding improved today; appreciate oral surgery assistance     - Hgb holding at 7.7 this AM; oral bleeding is improved  Hypoglycemia: - Secondary to poor oral intake ? Louis Ford - Cortisol level normal at 10. TSH mildly elevated.  - CBG capillary at 60 serum 100/.  - Appreciate Dr Buddy Duty help. Will need to check protein C and insulin level if blood sugar venous sample less than 55. So far patient has been above 60.  - Patient has been off of D5 IV fluids. Encourage oral intake.     - had been stable over last few days, but low again today. Again; encourage diet  Hypotension - post surgery likely related to precedex. - Patient became agitated after surgery in PACU. Was transiently on precex gtt. Subsequently became hypotensive SBP  in the 70. Anesthesiology ordered IV bolus. SBP in the high 80.  - Patient also with spike after QRS, unclear if defibrillator is censing correctly. Cardiology has been consulted to evaluate rhythm.  - BP improved.   Possible Ventricular Undersensing;  - Appreciate Cardiology evaluation.  - Defibrillator was  interrogated, defibrillator is working normally.  Constipation:  - Schedule Senokot. - Dulcolax PRN  Mild hyperkalemia: - Resolved after 2 doses of lokelma.   Dizziness lightheadedness:  - Likely related to blood loss. - Improved.   Chronic congestive heart failure status post AICD. - Due to soft blood pressure, Aldactone, Coreg, nitrates and hydralazine has been on hold. - Cardiology follow up.  - low-dose carvedilol.  Chronic A. fib:      - Continue with amiodarone. - PRN labetalol.  - low-dose carvedilol.  History of seizure disorder: - Continue with Depakote, Keppra , phenytoin.  Dementia without behavioral disturbance:  - Stable  Deconditioning:  - He needs PT rehab. - Family does not want him going to SNF; says they want him home w/ HHPT  COVID+ - two tests earlier in this admission were negative; 8/18 test is positive - ?false positive, have asked ID for input, appreciate assistance     - ID request repeat in-house COVID test; it is negative; awaiting recs     - d/w ID; multiple in-house tests have been negative, we believe that the 8/18 test was a false positive; he is without symptoms suspicious for COVID  Improved since trip to OR. Oral bleeding is greatly reduced. Hgb is ok. He is COVID negative. I think at this point we get him HHPT and discharge, likely in AM. Updated wife by phone this afternoon.  DVT prophylaxis:SCDs Code Status:FULL Disposition Plan:TBD  Consultants:  Oral Surgery  Hematology  Cardiology  Endocrine  ROS:  Denies CP, dyspnea, N, V. Reports bleeding is slowed . Remainder 10-pt ROS is negative for all not previously mentioned.  Subjective: "I guess that's good."  Objective: Vitals:   08/13/19 1745 08/13/19 2125 08/13/19 2144 08/14/19 0352  BP: (!) 106/91 108/78  103/73  Pulse: 99 (!) 113  95  Resp: 18 (!) 22 20 18   Temp: 98.4 F (36.9 C)  98.4 F (36.9 C)  98.4 F (36.9 C)  TempSrc: Oral Oral  Axillary  SpO2: 100% 100%  100%  Weight:    98.7 kg  Height:        Intake/Output Summary (Last 24 hours) at 08/14/2019 1350 Last data filed at 08/14/2019 1004 Gross per 24 hour  Intake 1113.91 ml  Output 1175 ml  Net -61.09 ml   Filed Weights   08/06/19 0432 08/10/19 0146 08/14/19 0352  Weight: 95.8 kg 95.9 kg 98.7 kg    Examination:  General: 73 y.o. male resting in bed in NAD Eyes: PERRL, normal sclera ENMT: Nares patent w/o discharge, orophaynx clear, dentition normal, ears w/o discharge/lesions/ulcers Cardiovascular: RRR, +S1, S2, no m/g/r, equal pulses throughout Respiratory: CTABL, no w/r/r, normal WOB GI: BS+, NDNT, no masses noted, no organomegaly noted MSK: No e/c/c Skin: No rashes, bruises, ulcerations noted Neuro: A&O x 3, no focal deficits Psyc: Appropriate interaction and affect, calm/cooperative   Data Reviewed: I have personally reviewed following labs and imaging studies.  CBC: Recent Labs  Lab 08/07/19 1612 08/08/19 0002  08/09/19 0436 08/10/19 0415 08/11/19 0449 08/12/19 0508 08/13/19 0439 08/14/19 0456  WBC 5.9 5.3   < > 5.3 5.6  --  4.9 6.1 4.8  NEUTROABS 3.7  2.8  --   --   --   --   --  4.2 2.7  HGB 9.2* 8.9*   < > 8.6* 8.8* 8.7* 8.7* 8.2* 7.7*  HCT 31.3* 28.6*   < > 28.6* 28.8* 28.6* 28.9* 27.5* 25.6*  MCV 94.0 93.5   < > 95.0 95.0  --  96.7 97.9 98.1  PLT 270 226   < > 201 203  --  210 220 185   < > = values in this interval not displayed.   Basic Metabolic Panel: Recent Labs  Lab 08/08/19 1700 08/09/19 0436 08/10/19 0415 08/12/19 0508 08/13/19 0439 08/14/19 0456  NA  --  136 137 138 136 139  K  --  4.5 4.7 4.7 4.9 4.7  CL  --  107 107 113* 111 113*  CO2  --  23 24 22 22 23   GLUCOSE  --  92 79 76 93 113*  BUN  --  10 10 9 11 12   CREATININE  --  0.90 0.98 0.88 1.03 1.05  CALCIUM  --  8.0* 7.9* 7.9* 7.7* 7.9*  MG 1.9  --   --   --  2.0 1.8  PHOS  --   --   --   --  4.1  3.5   GFR: Estimated Creatinine Clearance: 78.4 mL/min (by C-G formula based on SCr of 1.05 mg/dL). Liver Function Tests: Recent Labs  Lab 08/13/19 0439 08/14/19 0456  ALBUMIN 2.7* 2.4*   No results for input(s): LIPASE, AMYLASE in the last 168 hours. No results for input(s): AMMONIA in the last 168 hours. Coagulation Profile: Recent Labs  Lab 08/12/19 1824  INR 1.4*   Cardiac Enzymes: No results for input(s): CKTOTAL, CKMB, CKMBINDEX, TROPONINI in the last 168 hours. BNP (last 3 results) Recent Labs    11/26/18 1025  PROBNP 56.0   HbA1C: No results for input(s): HGBA1C in the last 72 hours. CBG: Recent Labs  Lab 08/13/19 1741 08/13/19 2122 08/14/19 0853 08/14/19 1002 08/14/19 1307  GLUCAP 91 89 53* 85 67*   Lipid Profile: No results for input(s): CHOL, HDL, LDLCALC, TRIG, CHOLHDL, LDLDIRECT in the last 72 hours. Thyroid Function Tests: No results for input(s): TSH, T4TOTAL, FREET4, T3FREE, THYROIDAB in the last 72 hours. Anemia Panel: No results for input(s): VITAMINB12, FOLATE, FERRITIN, TIBC, IRON, RETICCTPCT in the last 72 hours. Sepsis Labs: No results for input(s): PROCALCITON, LATICACIDVEN in the last 168 hours.  Recent Results (from the past 240 hour(s))  MRSA PCR Screening     Status: None   Collection Time: 08/08/19  7:01 AM   Specimen: Nasal Mucosa; Nasopharyngeal  Result Value Ref Range Status   MRSA by PCR NEGATIVE NEGATIVE Final    Comment:        The GeneXpert MRSA Assay (FDA approved for NASAL specimens only), is one component of a comprehensive MRSA colonization surveillance program. It is not intended to diagnose MRSA infection nor to guide or monitor treatment for MRSA infections. Performed at [facility], [place] [address] [place], [place] [address]   Novel Coronavirus, NAA (hospital order; send-out to ref lab)     Status: Abnormal   Collection Time: 08/11/19 11:17 AM   Specimen: Nasopharyngeal Swab; Respiratory   Result Value Ref Range Status   SARS-CoV-2, NAA DETECTED (A) NOT DETECTED Final    Comment: (NOTE)                  Client Requested Flag This test was  developed and its performance characteristics determined by [employer]. This test has not been FDA cleared or approved. This test has been authorized by FDA under an Emergency Use Authorization (EUA). This test is only authorized for the duration of time the declaration that circumstances exist justifying the authorization of the emergency use of in vitro diagnostic tests for detection of SARS-CoV-2 virus and/or diagnosis of COVID-19 infection under section 564(b)(1) of the Act, 21 U.S.C. [id number]), unless the authorization is terminated or revoked sooner. When diagnostic testing is negative, the possibility of a false negative result should be considered in the context of a patient's recent exposures and the presence of clinical signs and symptoms consistent with COVID-19. An individual without symptoms of COVID-19 and who is not shedding SARS-CoV-2 virus would expect to have a negative (not det ected) result in this assay. Performed At: [facility] [place], [place] [id number] Rush Farmer MD [id number]    [place]  Final    Comment: Performed at [place] [address], [place], [place] [address]  SARS Coronavirus 2 [facility] order, Performed in [facility] hospital lab) Nasopharyngeal Nasopharyngeal Swab     Status: None   Collection Time: 08/13/19  3:42 PM   Specimen: Nasopharyngeal Swab  Result Value Ref Range Status   SARS Coronavirus 2 NEGATIVE NEGATIVE Final    Comment: (NOTE) If result is NEGATIVE SARS-CoV-2 target nucleic acids are NOT DETECTED. The SARS-CoV-2 RNA is generally detectable in upper and lower  respiratory specimens during the acute phase of infection. The lowest  concentration of SARS-CoV-2 viral copies this assay  can detect is 250  copies / mL. A negative result does not preclude SARS-CoV-2 infection  and should not be used as the sole basis for treatment or other  patient management decisions.  A negative result may occur with  improper specimen collection / handling, submission of specimen other  than nasopharyngeal swab, presence of viral mutation(s) within the  areas targeted by this assay, and inadequate number of viral copies  (<250 copies / mL). A negative result must be combined with clinical  observations, patient history, and epidemiological information. If result is POSITIVE SARS-CoV-2 target nucleic acids are DETECTED. The SARS-CoV-2 RNA is generally detectable in upper and lower  respiratory specimens dur ing the acute phase of infection.  Positive  results are indicative of active infection with SARS-CoV-2.  Clinical  correlation with patient history and other diagnostic information is  necessary to determine patient infection status.  Positive results do  not rule out bacterial infection or co-infection with other viruses. If result is PRESUMPTIVE POSTIVE SARS-CoV-2 nucleic acids MAY BE PRESENT.   A presumptive positive result was obtained on the submitted specimen  and confirmed on repeat testing.  While 2019 novel coronavirus  (SARS-CoV-2) nucleic acids may be present in the submitted sample  additional confirmatory testing may be necessary for epidemiological  and / or clinical management purposes  to differentiate between  SARS-CoV-2 and other Sarbecovirus currently known to infect humans.  If clinically indicated additional testing with an alternate test  methodology [phone]) is advised. The SARS-CoV-2 RNA is generally  detectable in upper and lower respiratory sp ecimens during the acute  phase of infection. The expected result is Negative. Fact Sheet for Patients:  StrictlyIdeas.no Fact Sheet for Healthcare Providers:  BankingDealers.co.za This test is not yet approved or cleared by the [place] FDA and has been authorized for detection and/or diagnosis of SARS-CoV-2 by  FDA under an Emergency Use Authorization (EUA).  This EUA will remain in effect (meaning this test can be used) for the duration of the COVID-19 declaration under Section 564(b)(1) of the Act, 21 U.S.C. section 360bbb-3(b)(1), unless the authorization is terminated or revoked sooner. Performed at [facility], [place] [address] [place], [place] [address]       Radiology Studies: No results found.   Scheduled Meds: . sodium chloride   Intravenous Once  . sodium chloride   Intravenous Once  . amiodarone  200 mg Oral Daily  . carvedilol  3.125 mg Oral BID WC  . Chlorhexidine Gluconate Cloth  6 each Topical Daily  . divalproex  500 mg Oral Daily  . feeding supplement (ENSURE ENLIVE)  237 mL Oral TID BM  . ferrous sulfate  325 mg Oral BID WC  . levETIRAcetam  1,000 mg Oral BID  .  morphine injection  2 mg Intravenous Once  . phenytoin  100 mg Oral BID  . phytonadione  5 mg Oral Daily  . senna-docusate  1 tablet Oral BID  . sodium chloride flush  10-40 mL Intracatheter Q12H  . vitamin B-12  100 mcg Oral Daily   Continuous Infusions: . sodium chloride 75 mL/hr at 08/14/19 1012     LOS: 24 days    Time spent: 25 minutes spent in the coordination of care today.   Jonnie Finner, DO [facility] Pager [phone]  If 7PM-7AM, please contact night-coverage [email or web address] Password [email or web address] 08/14/2019, 1:50 PM

## 2019-08-14 NOTE — Progress Notes (Signed)
RN rechecked patient glucose: 85.

## 2019-08-14 NOTE — Progress Notes (Signed)
Spoke with MD about isolation precautions. Per MD may d/c isolation precautions due to belief of false positive. Order discontinued. Patient updated and happy that his door can be open.

## 2019-08-14 NOTE — Progress Notes (Signed)
NT reported glucose level of 53 at 0853.  Patient refused to eat or drink anything to increase glucose level.  RN administered 12.5 gm or IV dextrose; will recheck glucose level in 15 minutes.

## 2019-08-14 NOTE — Progress Notes (Signed)
Patient glucose 67 @ 1307. Patient eating.  Will recheck after meal.

## 2019-08-14 NOTE — TOC Progression Note (Signed)
Transition of Care (TOC) - Progression Note    Patient Details  Name: [facility]. MRN: [id number] Date of Birth: September 13, 1946  Transition of Care [facility]) CM/SW [place],  Phone Number: 08/14/2019, 4:38 PM  Clinical Narrative:   Patient at this time does not have a [place] agency that will be able to follow him in the home. CSW has contacted [facility], [facility], [facility], [facility], [facility] and [facility] and all agency's have declined patients. CSW made MD aware. CSW spoke to patient wife and made her aware. Spouse stated that she still wants patient to discharge home. Spouse stated that she does not think patient will do well at a SNF because he does not want to catch Covid. Spouse stated she understands that no home health agency will be able to take patient but stated she still wants patient to come home. CSW will let MD aware that spouse is wanting patient home         Expected Discharge Plan and Services           Expected Discharge Date: (unknown)                                     Social Determinants of Health (SDOH) Interventions    Readmission Risk Interventions Readmission Risk Prevention Plan 08/07/2019  Transportation Screening Complete  PCP or Specialist Appt within 3-5 Days Complete  HRI or [place] Not Complete  HRI or Home Care Consult comments To SNF  Social Work Consult for [place] Planning/Counseling Not Complete  SW consult not completed comments NA  Palliative Care Screening Not Applicable  Medication Review Press photographer) Complete  Some recent data might be hidden

## 2019-08-15 LAB — CBC WITH DIFFERENTIAL/PLATELET
Abs Immature Granulocytes: 0.01 10*3/uL (ref 0.00–0.07)
Basophils Absolute: 0 10*3/uL (ref 0.0–0.1)
Basophils Relative: 0 %
Eosinophils Absolute: 0.5 10*3/uL (ref 0.0–0.5)
Eosinophils Relative: 12 %
HCT: 26.6 % — ABNORMAL LOW (ref 39.0–52.0)
Hemoglobin: 7.9 g/dL — ABNORMAL LOW (ref 13.0–17.0)
Immature Granulocytes: 0 %
Lymphocytes Relative: 18 %
Lymphs Abs: 0.7 10*3/uL (ref 0.7–4.0)
MCH: 28.8 pg (ref 26.0–34.0)
MCHC: 29.7 g/dL — ABNORMAL LOW (ref 30.0–36.0)
MCV: 97.1 fL (ref 80.0–100.0)
Monocytes Absolute: 0.6 10*3/uL (ref 0.1–1.0)
Monocytes Relative: 15 %
Neutro Abs: 2.3 10*3/uL (ref 1.7–7.7)
Neutrophils Relative %: 55 %
Platelets: 204 10*3/uL (ref 150–400)
RBC: 2.74 MIL/uL — ABNORMAL LOW (ref 4.22–5.81)
RDW: 17.3 % — ABNORMAL HIGH (ref 11.5–15.5)
WBC: 4.2 10*3/uL (ref 4.0–10.5)
nRBC: 0.7 % — ABNORMAL HIGH (ref 0.0–0.2)

## 2019-08-15 LAB — VON WILLEBRAND FACTOR MULTIMER

## 2019-08-15 LAB — RENAL FUNCTION PANEL
Albumin: 2.4 g/dL — ABNORMAL LOW (ref 3.5–5.0)
Anion gap: 4 — ABNORMAL LOW (ref 5–15)
BUN: 12 mg/dL (ref 8–23)
CO2: 22 mmol/L (ref 22–32)
Calcium: 8 mg/dL — ABNORMAL LOW (ref 8.9–10.3)
Chloride: 113 mmol/L — ABNORMAL HIGH (ref 98–111)
Creatinine, Ser: 0.92 mg/dL (ref 0.61–1.24)
GFR calc Af Amer: >60 mL/min (ref >60)
GFR calc non Af Amer: >60 mL/min (ref >60)
Glucose, Bld: 80 mg/dL (ref 70–99)
Phosphorus: 3.9 mg/dL (ref 2.5–4.6)
Potassium: 4.5 mmol/L (ref 3.5–5.1)
Sodium: 139 mmol/L (ref 135–145)

## 2019-08-15 LAB — GLUCOSE, CAPILLARY
Glucose-Capillary: 70 mg/dL (ref 70–99)
Glucose-Capillary: 113 mg/dL — ABNORMAL HIGH (ref 70–99)
Glucose-Capillary: 131 mg/dL — ABNORMAL HIGH (ref 70–99)
Glucose-Capillary: 73 mg/dL (ref 70–99)
Glucose-Capillary: 87 mg/dL (ref 70–99)

## 2019-08-15 MED ORDER — LEVETIRACETAM 100 MG/ML PO SOLN
1000.0000 mg | Freq: Two times a day (BID) | ORAL | Status: DC
  Administered 2019-08-15 – 2019-08-21 (×12): 1000 mg via ORAL
  Filled 2019-08-15 (×13): qty 10

## 2019-08-15 NOTE — Progress Notes (Signed)
Louis Ford  PROGRESS NOTE    [facility].  [id number] DOB: [birth year] DOA: 07/20/2019 PCP: [name], MD   Brief Narrative:   73 year old with past medical history significant for cardiovascular disease, chronic systolic heart failure ejection fraction 25%, status post AICD, chronic A. 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 Patient presented to the hospital on 7/27 with lightheadedness and dizziness after having tooth removed, bleeding from the mouth and unable to eat properly. Per the patient he has not been on any blood thinner since he had retroperitoneal bleed about 2 months prior to admission. Patient is able to walk with a walker. Also has a history of seizure and is on multiple medications.  Patient had gone to the dentist and had almost all his teeth removed on 07/08/2019, following that he had continued bleeding into his mouth and he had talk to their dentist who recommended to control with gauze piece. Patient has had poor oral intake.   Since hospitalization, patient has required occasional transfusion as well as FFP. Also due to poor oral intake, has episode of hypoglycemia requiring D5 drip. The morning 8/11 patient had another episode of oral bleeding through several pads, although hemoglobin has remained stable. Patient's second COVID test was also negative.   Assessment & Plan:   Principal Problem:   Anemia due to blood loss, acute Active Problems:   HTN (hypertension)   Coronary artery disease involving native heart   Automatic implantable cardioverter-defibrillator in situ   Seizure (HCC)   Renal insufficiency   Factor VII deficiency (HCC)   Chronic systolic CHF (congestive heart failure), NYHA class 2 (HCC)   Chronic kidney disease (CKD), stage III (moderate) (HCC)   Oral bleeding   Coagulopathy ([place])   Hospice care patient   Palliative care patient   Complications due to automatic  implantable cardioverter-defibrillator   Anemia due to acute blood loss, symptomatic anemia:  - post operative dental bleeding.  - Patient has required blood transfusion during this admission due to dropping hemoglobin. - He has also required FFP and vitamin K. - Dr. [name] oral surgeon did suture of incision on 8/2; multiple interventions since; going to the OR again today - He has a history of factor VII deficiency/ ?, hematology was consulted. Work-up has been sent out. - Need vitamin K for 10 to 14 days. - Per hematology is some concern for possible C PVC of liver and possible related coagulopathy is recommending vitamin K. - Patient also received Amicar; not compliant on it, needs to use it - S/P 1 unit PRBC 8-14; Hgb is ok - went to OR for hemostasis yesterday; bleeding improved today; appreciate oral surgery assistance     - Hgb holding at 7.9 this AM; oral bleeding is improved  Hypoglycemia: - Secondary to poor oral intake ? Louis Ford - Cortisol level normal at 10. TSH mildly elevated.  - CBG capillary at 60 serum 100/.  - Appreciate Dr Buddy Duty help. Will need to check protein C and insulin level if blood sugar venous sample less than 55. So far patient has been above 60.  - Patient has been off of D5 IV fluids. Encourage oral intake.     - had been stable over last few days, but low again today. Again; encourage diet  Hypotension - post surgery likely related to precedex. - Patient became agitated after surgery in PACU. Was transiently on precex gtt. Subsequently became hypotensive SBP in the 70. Anesthesiology  ordered IV bolus. SBP in the high 80.  - Patient also with spike after QRS, unclear if defibrillator is censing correctly. Cardiology has been consulted to evaluate rhythm.  - BP improved.   Possible Ventricular Undersensing;  - Appreciate Cardiology evaluation.  - Defibrillator was  interrogated, defibrillator is working normally.  Constipation:  - Schedule Senokot. - Dulcolax PRN  Mild hyperkalemia: - Resolved after 2 doses of lokelma.   Dizziness lightheadedness:  - Likely related to blood loss. - Improved.   Chronic congestive heart failure status post AICD. - Due to soft blood pressure, Aldactone, Coreg, nitrates and hydralazine has been on hold. - Cardiology follow up.  - low-dose carvedilol.  Chronic A. fib:      - Continue with amiodarone. - PRN labetalol.  - low-dose carvedilol.  History of seizure disorder: - Continue with Depakote, Keppra , phenytoin.  Dementia without behavioral disturbance:  - Stable  Deconditioning:  - He needs PT rehab. - Family does not want him going to SNF; says they want him home w/ HHPT     - he is unable to get HHPT     - he would be a bit much for his wife to handle on her own; I discussed this with her, she says she will talk to him about SNF  COVID+ - two tests earlier in this admission were negative; 8/18 test is positive - ?false positive, have asked ID for input, appreciate assistance - ID request repeat in-house COVID test; it is negative; awaiting recs     - d/w ID; multiple in-house tests have been negative, we believe that the 8/18 test was a false positive; he is without symptoms suspicious for COVID  Needs to get mobilized, but difficult. Would be difficult for wife to handle. Spoke with wife. She will talk with him about SNF.  DVT prophylaxis:SCDs Code Status:FULL Disposition Plan:TBD  Consultants:  Oral Surgery  Hematology  Cardiology  Endocrine  ROS:  Denies CP, dyspnea, N, V. Reports tiredness. Remainder 10-pt ROS is negative for all not previously mentioned.  Subjective: "I'll do it tomorrow."  Objective: Vitals:   08/14/19 2316 08/15/19 0500 08/15/19 1054 08/15/19 1227  BP: 105/79 110/78  98/73 107/75  Pulse: 92 83 (!) 101 95  Resp: 20 20 16  (!) 25  Temp: 98.3 F (36.8 C) 97.7 F (36.5 C)  98 F (36.7 C)  TempSrc: Axillary Axillary  Oral  SpO2: 100% 100% 97% 100%  Weight:  98.5 kg    Height:        Intake/Output Summary (Last 24 hours) at 08/15/2019 1433 Last data filed at 08/15/2019 [id number] Gross per 24 hour  Intake 1363.44 ml  Output 900 ml  Net 463.44 ml   Filed Weights   08/10/19 0146 08/14/19 0352 08/15/19 0500  Weight: 95.9 kg 98.7 kg 98.5 kg    Examination:  General: 73 y.o. male resting in bed in NAD Eyes: PERRL, normal sclera ENMT: Nares patent w/o discharge, orophaynx clear, dentition normal, ears w/o discharge/lesions/ulcers Cardiovascular: RRR, +S1, S2, no m/g/r, equal pulses throughout Respiratory: CTABL, no w/r/r, normal WOB GI: BS+, NDNT, no masses noted, no organomegaly noted MSK: No e/c/c Skin: No rashes, bruises, ulcerations noted Neuro: A&O x 3, no focal deficits Psyc: Appropriate interaction and affect, calm/cooperative   Data Reviewed: I have personally reviewed following labs and imaging studies.  CBC: Recent Labs  Lab 08/10/19 0415 08/11/19 0449 08/12/19 0508 08/13/19 0439 08/14/19 0456 08/15/19 0919  WBC 5.6  --  4.9 6.1 4.8 4.2  NEUTROABS  --   --   --  4.2 2.7 2.3  HGB 8.8* 8.7* 8.7* 8.2* 7.7* 7.9*  HCT 28.8* 28.6* 28.9* 27.5* 25.6* 26.6*  MCV 95.0  --  96.7 97.9 98.1 97.1  PLT 203  --  210 220 185 0000000   Basic Metabolic Panel: Recent Labs  Lab 08/08/19 1700  08/10/19 0415 08/12/19 0508 08/13/19 0439 08/14/19 0456 08/15/19 0919  NA  --    < > 137 138 136 139 139  K  --    < > 4.7 4.7 4.9 4.7 4.5  CL  --    < > 107 113* 111 113* 113*  CO2  --    < > 24 22 22 23 22   GLUCOSE  --    < > 79 76 93 113* 80  BUN  --    < > 10 9 11 12 12   CREATININE  --    < > 0.98 0.88 1.03 1.05 0.92  CALCIUM  --    < > 7.9* 7.9* 7.7* 7.9* 8.0*  MG 1.9  --   --   --  2.0 1.8  --   PHOS  --   --   --   --  4.1 3.5 3.9   < > =  values in this interval not displayed.   GFR: Estimated Creatinine Clearance: 89.4 mL/min (by C-G formula based on SCr of 0.92 mg/dL). Liver Function Tests: Recent Labs  Lab 08/13/19 0439 08/14/19 0456 08/15/19 0919  ALBUMIN 2.7* 2.4* 2.4*   No results for input(s): LIPASE, AMYLASE in the last 168 hours. No results for input(s): AMMONIA in the last 168 hours. Coagulation Profile: Recent Labs  Lab 08/12/19 1824  INR 1.4*   Cardiac Enzymes: No results for input(s): CKTOTAL, CKMB, CKMBINDEX, TROPONINI in the last 168 hours. BNP (last 3 results) Recent Labs    11/26/18 1025  PROBNP 56.0   HbA1C: No results for input(s): HGBA1C in the last 72 hours. CBG: Recent Labs  Lab 08/14/19 1508 08/14/19 2110 08/15/19 0747 08/15/19 1049 08/15/19 1214  GLUCAP 101* 98 70 113* 131*   Lipid Profile: No results for input(s): CHOL, HDL, LDLCALC, TRIG, CHOLHDL, LDLDIRECT in the last 72 hours. Thyroid Function Tests: No results for input(s): TSH, T4TOTAL, FREET4, T3FREE, THYROIDAB in the last 72 hours. Anemia Panel: No results for input(s): VITAMINB12, FOLATE, FERRITIN, TIBC, IRON, RETICCTPCT in the last 72 hours. Sepsis Labs: No results for input(s): PROCALCITON, LATICACIDVEN in the last 168 hours.  Recent Results (from the past 240 hour(s))  MRSA PCR Screening     Status: None   Collection Time: 08/08/19  7:01 AM   Specimen: Nasal Mucosa; Nasopharyngeal  Result Value Ref Range Status   MRSA by PCR NEGATIVE NEGATIVE Final    Comment:        The GeneXpert MRSA Assay (FDA approved for NASAL specimens only), is one component of a comprehensive MRSA colonization surveillance program. It is not intended to diagnose MRSA infection nor to guide or monitor treatment for MRSA infections. Performed at [facility], [place] [address] [place], [place] [address]   Novel Coronavirus, NAA (hospital order; send-out to ref lab)     Status: Abnormal   Collection Time:  08/11/19 11:17 AM   Specimen: Nasopharyngeal Swab; Respiratory  Result Value Ref Range Status   SARS-CoV-2, NAA DETECTED (A) NOT DETECTED Final    Comment: (NOTE)  Client Requested Flag This test was developed and its performance characteristics determined by [employer]. This test has not been FDA cleared or approved. This test has been authorized by FDA under an Emergency Use Authorization (EUA). This test is only authorized for the duration of time the declaration that circumstances exist justifying the authorization of the emergency use of in vitro diagnostic tests for detection of SARS-CoV-2 virus and/or diagnosis of COVID-19 infection under section 564(b)(1) of the Act, 21 U.S.C. [id number]), unless the authorization is terminated or revoked sooner. When diagnostic testing is negative, the possibility of a false negative result should be considered in the context of a patient's recent exposures and the presence of clinical signs and symptoms consistent with COVID-19. An individual without symptoms of COVID-19 and who is not shedding SARS-CoV-2 virus would expect to have a negative (not det ected) result in this assay. Performed At: [facility] [place], [place] [id number] Rush Farmer MD [id number]    [place]  Final    Comment: Performed at [place] [address] [place], [place] [address]  SARS Coronavirus 2 [facility] order, Performed in [facility] hospital lab) Nasopharyngeal Nasopharyngeal Swab     Status: None   Collection Time: 08/13/19  3:42 PM   Specimen: Nasopharyngeal Swab  Result Value Ref Range Status   SARS Coronavirus 2 NEGATIVE NEGATIVE Final    Comment: (NOTE) If result is NEGATIVE SARS-CoV-2 target nucleic acids are NOT DETECTED. The SARS-CoV-2 RNA is generally detectable in upper and lower  respiratory specimens during the acute phase of infection.  The lowest  concentration of SARS-CoV-2 viral copies this assay can detect is 250  copies / mL. A negative result does not preclude SARS-CoV-2 infection  and should not be used as the sole basis for treatment or other  patient management decisions.  A negative result may occur with  improper specimen collection / handling, submission of specimen other  than nasopharyngeal swab, presence of viral mutation(s) within the  areas targeted by this assay, and inadequate number of viral copies  (<250 copies / mL). A negative result must be combined with clinical  observations, patient history, and epidemiological information. If result is POSITIVE SARS-CoV-2 target nucleic acids are DETECTED. The SARS-CoV-2 RNA is generally detectable in upper and lower  respiratory specimens dur ing the acute phase of infection.  Positive  results are indicative of active infection with SARS-CoV-2.  Clinical  correlation with patient history and other diagnostic information is  necessary to determine patient infection status.  Positive results do  not rule out bacterial infection or co-infection with other viruses. If result is PRESUMPTIVE POSTIVE SARS-CoV-2 nucleic acids MAY BE PRESENT.   A presumptive positive result was obtained on the submitted specimen  and confirmed on repeat testing.  While 2019 novel coronavirus  (SARS-CoV-2) nucleic acids may be present in the submitted sample  additional confirmatory testing may be necessary for epidemiological  and / or clinical management purposes  to differentiate between  SARS-CoV-2 and other Sarbecovirus currently known to infect humans.  If clinically indicated additional testing with an alternate test  methodology [phone]) is advised. The SARS-CoV-2 RNA is generally  detectable in upper and lower respiratory sp ecimens during the acute  phase of infection. The expected result is Negative. Fact Sheet for Patients:  StrictlyIdeas.no  Fact Sheet for Healthcare Providers: BankingDealers.co.za This test is not yet approved or cleared by the [place] FDA and has been authorized for  detection and/or diagnosis of SARS-CoV-2 by FDA under an Emergency Use Authorization (EUA).  This EUA will remain in effect (meaning this test can be used) for the duration of the COVID-19 declaration under Section 564(b)(1) of the Act, 21 U.S.C. section 360bbb-3(b)(1), unless the authorization is terminated or revoked sooner. Performed at [facility], [place] [address], [place], [place] [address]       Radiology Studies: No results found.   Scheduled Meds: . sodium chloride   Intravenous Once  . sodium chloride   Intravenous Once  . amiodarone  200 mg Oral Daily  . carvedilol  3.125 mg Oral BID WC  . Chlorhexidine Gluconate Cloth  6 each Topical Daily  . divalproex  500 mg Oral Daily  . feeding supplement (ENSURE ENLIVE)  237 mL Oral TID BM  . ferrous sulfate  325 mg Oral BID WC  . levETIRAcetam  1,000 mg Oral BID  .  morphine injection  2 mg Intravenous Once  . phenytoin  100 mg Oral BID  . phytonadione  5 mg Oral Daily  . senna-docusate  1 tablet Oral BID  . sodium chloride flush  10-40 mL Intracatheter Q12H  . vitamin B-12  100 mcg Oral Daily   Continuous Infusions: . sodium chloride 75 mL/hr at 08/15/19 0415     LOS: 25 days    Time spent: 25 minutes spent in the coordination of care today.   Jonnie Finner, DO [facility] Pager [phone]  If 7PM-7AM, please contact night-coverage [email or web address] Password [facility] 08/15/2019, 2:33 PM

## 2019-08-15 NOTE — TOC Progression Note (Signed)
Transition of Care (TOC) - Progression Note    Patient Details  Name: Louis Ford. MRN: [id number] Date of Birth: 10-Feb-1946  Transition of Care [facility]) CM/SW [place], [place] Phone Number: 08/15/2019, 4:20 PM  Clinical Narrative:   CSW spoke to both patient and spouse. They are both in agreement for patient to transition to a SNF. CSW spoke to spouse and stated that the only facility available for patients to discharge too is [facility]. Spouse stated she wants patient to come home but understands that patient might not do well at home and he is needing rehab.   CSW spoke with Admission Coordinator at [facility] and they stated that patient will be able to discharge tomorrow morning to there facility is medically stable           Expected Discharge Plan and Services           Expected Discharge Date: (unknown)                                     Social Determinants of Health (SDOH) Interventions    Readmission Risk Interventions Readmission Risk Prevention Plan 08/07/2019  Transportation Screening Complete  PCP or Specialist Appt within 3-5 Days Complete  HRI or [place] Not Complete  HRI or Home Care Consult comments To SNF  Social Work Consult for [place] Planning/Counseling Not Complete  SW consult not completed comments NA  Palliative Care Screening Not Applicable  Medication Review Press photographer) Complete  Some recent data might be hidden

## 2019-08-15 NOTE — Progress Notes (Signed)
Patient found to be cool and clammy. CBG 113. BP 98/73. Patient without complaints of dizziness/light headedness/nausea/vomiting/chest pain or shortness of breath sitting in bed. Md notified and to hold afternoon Coreg. Continuing to monitor. Eulas Post, RN

## 2019-08-16 LAB — CBC WITH DIFFERENTIAL/PLATELET
Abs Immature Granulocytes: 0.03 10*3/uL (ref 0.00–0.07)
Basophils Absolute: 0 10*3/uL (ref 0.0–0.1)
Basophils Relative: 0 %
Eosinophils Absolute: 0.6 10*3/uL — ABNORMAL HIGH (ref 0.0–0.5)
Eosinophils Relative: 12 %
HCT: 27.1 % — ABNORMAL LOW (ref 39.0–52.0)
Hemoglobin: 8.2 g/dL — ABNORMAL LOW (ref 13.0–17.0)
Immature Granulocytes: 1 %
Lymphocytes Relative: 20 %
Lymphs Abs: 0.9 10*3/uL (ref 0.7–4.0)
MCH: 29.4 pg (ref 26.0–34.0)
MCHC: 30.3 g/dL (ref 30.0–36.0)
MCV: 97.1 fL (ref 80.0–100.0)
Monocytes Absolute: 0.8 10*3/uL (ref 0.1–1.0)
Monocytes Relative: 17 %
Neutro Abs: 2.3 10*3/uL (ref 1.7–7.7)
Neutrophils Relative %: 50 %
Platelets: 216 10*3/uL (ref 150–400)
RBC: 2.79 MIL/uL — ABNORMAL LOW (ref 4.22–5.81)
RDW: 17.5 % — ABNORMAL HIGH (ref 11.5–15.5)
WBC: 4.6 10*3/uL (ref 4.0–10.5)
nRBC: 1.3 % — ABNORMAL HIGH (ref 0.0–0.2)

## 2019-08-16 LAB — RENAL FUNCTION PANEL
Albumin: 2.4 g/dL — ABNORMAL LOW (ref 3.5–5.0)
Anion gap: 2 — ABNORMAL LOW (ref 5–15)
BUN: 11 mg/dL (ref 8–23)
CO2: 22 mmol/L (ref 22–32)
Calcium: 7.8 mg/dL — ABNORMAL LOW (ref 8.9–10.3)
Chloride: 115 mmol/L — ABNORMAL HIGH (ref 98–111)
Creatinine, Ser: 0.95 mg/dL (ref 0.61–1.24)
GFR calc Af Amer: >60 mL/min (ref >60)
GFR calc non Af Amer: >60 mL/min (ref >60)
Glucose, Bld: 74 mg/dL (ref 70–99)
Phosphorus: 3.4 mg/dL (ref 2.5–4.6)
Potassium: 4.9 mmol/L (ref 3.5–5.1)
Sodium: 139 mmol/L (ref 135–145)

## 2019-08-16 LAB — GLUCOSE, CAPILLARY
Glucose-Capillary: 67 mg/dL — ABNORMAL LOW (ref 70–99)
Glucose-Capillary: 83 mg/dL (ref 70–99)
Glucose-Capillary: 105 mg/dL — ABNORMAL HIGH (ref 70–99)
Glucose-Capillary: 87 mg/dL (ref 70–99)
Glucose-Capillary: 92 mg/dL (ref 70–99)

## 2019-08-16 LAB — MAGNESIUM: Magnesium: 1.8 mg/dL (ref 1.7–2.4)

## 2019-08-16 MED ORDER — CARVEDILOL 3.125 MG PO TABS: 3.1250 mg | ORAL_TABLET | Freq: Two times a day (BID) | ORAL | 1 refills | Status: DC

## 2019-08-16 MED ORDER — FERROUS SULFATE 325 (65 FE) MG PO TABS: 325.0000 mg | ORAL_TABLET | Freq: Two times a day (BID) | ORAL | 0 refills | Status: DC

## 2019-08-16 MED ORDER — MAGIC MOUTHWASH W/LIDOCAINE: 15.0000 mL | Freq: Four times a day (QID) | ORAL | 0 refills | Status: AC | PRN

## 2019-08-16 NOTE — Progress Notes (Signed)
Pt was up in hallway walking with assistance when tele informed RN pt was in [place]. Pt sat in chair and was wheeled back to room, HR came back down now Sinus rythum-slightly sinus tach. Vitals checked and are stable. Pt asymptomatic. Paged Kennon Holter, NP to notify.

## 2019-08-16 NOTE — Progress Notes (Signed)
[name]  PROGRESS NOTE    [facility].  [id number] DOB: March 09, 1946 DOA: 07/20/2019 PCP: [name], MD   Brief Narrative:   73 year old with past medical history significant for cardiovascular disease, chronic systolic heart failure ejection fraction 25%, status post AICD, chronic A. 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 Patient presented to the hospital on 7/27 with lightheadedness and dizziness after having tooth removed, bleeding from the mouth and unable to eat properly. Per the patient he has not been on any blood thinner since he had retroperitoneal bleed about 2 months prior to admission. Patient is able to walk with a walker. Also has a history of seizure and is on multiple medications.  Patient had gone to the dentist and had almost all his teeth removed on 07/08/2019, following that he had continued bleeding into his mouth and he had talk to their dentist who recommended to control with gauze piece. Patient has had poor oral intake.   Since hospitalization, patient has required occasional transfusion as well as FFP. Also due to poor oral intake, has episode of hypoglycemia requiring D5 drip. The morning 8/11 patient had another episode of oral bleeding through several pads, although hemoglobin has remained stable. Patient's second COVID test was also negative.   Assessment & Plan:   Principal Problem:   Anemia due to blood loss, acute Active Problems:   HTN (hypertension)   Coronary artery disease involving native heart   Automatic implantable cardioverter-defibrillator in situ   Seizure (HCC)   Renal insufficiency   Factor VII deficiency (HCC)   Chronic systolic CHF (congestive heart failure), NYHA class 2 (HCC)   Chronic kidney disease (CKD), stage III (moderate) (HCC)   Oral bleeding   Coagulopathy ([place])   Hospice care patient   Palliative care patient   Complications due to automatic  implantable cardioverter-defibrillator   Anemia due to acute blood loss, symptomatic anemia:  - post operative dental bleeding.  - Patient has required blood transfusion during this admission due to dropping hemoglobin. - He has also required FFP and vitamin K. - Dr. [name] oral surgeon did suture of incision on 8/2; multiple interventions since; going to the OR again today - He has a history of factor VII deficiency/ ?, hematology was consulted. Work-up has been sent out. - Need vitamin K for 10 to 14 day (08/16/19) - Per hematology is some concern for possible C PVC of liver and possible related coagulopathy is recommending vitamin K. - Patient also received Amicar; not compliant on it, needs to use it - S/P 1 unit PRBC 8-14; Hgb is ok - went to OR for hemostasis 8/20; bleeding improved; appreciate oral surgery assistance - Hgb holding at 8.2this AM; he says that he is continuing to bleed ON; he continues to pack his mouth with gauze (again, this is not helping); per nursing, not completely using Amicar  Hypoglycemia: - Secondary to poor oral intake ? [name] - Cortisol level normal at 10. TSH mildly elevated.  - CBG capillary at 60; serum 100  - Appreciate Dr Buddy Duty help. Will need to check protein C and insulin level if blood sugar venous sample less than 55. So far patient has been above 60.  - Patient has been off of D5 IV fluids. Encourage oral intake. - stable, encourage diet     - venous sugar samples have remained above 60 for days     - Hypoglycemic this AM; intermittently cooperative with diet  Hypotension - post surgery likely related to precedex. - Patient became agitated after surgery in PACU. Was transiently on precex gtt. Subsequently became hypotensive SBP in the 70. Anesthesiology ordered IV bolus. SBP in the high 80.  - Patient also with spike after QRS, unclear if defibrillator is censing  correctly. Cardiology has been consulted to evaluate rhythm.  - BP improved.   Possible Ventricular Undersensing;  - Appreciate Cardiology evaluation.  - Defibrillator was interrogated, defibrillator is working normally.  Constipation:  - Schedule Senokot. - Dulcolax PRN  Mild hyperkalemia: - Resolved after 2 doses of lokelma.   Dizziness lightheadedness:  - Likely related to blood loss. - Improved.   Chronic congestive heart failure status post AICD. - Due to soft blood pressure, Aldactone, Coreg, nitrates and hydralazine has been on hold. - Cardiology follow up.  - low-dose carvedilol.  Chronic A. fib: -Continue with amiodarone. - PRN labetalol.  - low-dose carvedilol.  History of seizure disorder: - Continue with Depakote, Keppra , phenytoin.  Dementia without behavioral disturbance:  - Stable  Deconditioning:  - He needs PT rehab. - Family does not want him going to SNF; says they want him home w/ HHPT - he is unable to get HHPT - he would be a bit much for his wife to handle on her own; I discussed this with her, she says she will talk to him about SNF     - pt/family agreed to SNF placement; will go when stable  COVID+ - two tests earlier in this admission were negative; 8/18 test is positive - ?false positive, have asked ID for input, appreciate assistance - ID request repeat in-house COVID test; it is negative; awaiting recs - d/w ID; multiple in-house tests have been negative, we believe that the 8/18 test was a false positive; he is without symptoms suspicious for COVID  DVT prophylaxis:SCDs Code Status:FULL Disposition Plan:TBD  Consultants:  Oral Surgery  Hematology  Cardiology  Endocrine  ROS: Denies CP, dyspnea, N, V. Reports continued bleeding. Remainder 10-pt ROS is negative for all not previously mentioned.  Subjective:  "I'm bleeding. You're not."  Objective: Vitals:   08/16/19 0009 08/16/19 0438 08/16/19 0750 08/16/19 1410  BP: 107/89 110/84 109/80   Pulse: (!) 120 (!) 122 89   Resp: 16 16 20    Temp: 98.6 F (37 C) 98.6 F (37 C) 98.6 F (37 C)   TempSrc:  Oral Axillary   SpO2: 100% 100% 100% 97%  Weight:      Height:        Intake/Output Summary (Last 24 hours) at 08/16/2019 1511 Last data filed at 08/16/2019 0947 Gross per 24 hour  Intake 1827 ml  Output 1600 ml  Net 227 ml   Filed Weights   08/10/19 0146 08/14/19 0352 08/15/19 0500  Weight: 95.9 kg 98.7 kg 98.5 kg    Examination:  General: 73 y.o. male resting in bed in NAD Eyes: PERRL, normal sclera ENMT: Nares patent w/o discharge, orophaynx clear, dentition normal, ears w/o discharge/lesions/ulcers Cardiovascular: RRR, +S1, S2, no m/g/r, equal pulses throughout Respiratory: CTABL, no w/r/r, normal WOB GI: BS+, NDNT, no masses noted, no organomegaly noted MSK: No e/c/c Skin: No rashes, bruises, ulcerations noted Neuro: A&O x 3, no focal deficits Psyc: Appropriate interaction and affect, calm/cooperative   Data Reviewed: I have personally reviewed following labs and imaging studies.  CBC: Recent Labs  Lab 08/12/19 0508 08/13/19 0439 08/14/19 0456 08/15/19 0919 08/16/19 0352  WBC 4.9 6.1 4.8 4.2 4.6  NEUTROABS  --  4.2 2.7 2.3 2.3  HGB 8.7* 8.2* 7.7* 7.9* 8.2*  HCT 28.9* 27.5* 25.6* 26.6* 27.1*  MCV 96.7 97.9 98.1 97.1 97.1  PLT 210 220 185 204 [id number]   Basic Metabolic Panel: Recent Labs  Lab 08/12/19 0508 08/13/19 0439 08/14/19 0456 08/15/19 0919 08/16/19 0352  NA 138 136 139 139 139  K 4.7 4.9 4.7 4.5 4.9  CL 113* 111 113* 113* 115*  CO2 22 22 23 22 22   GLUCOSE 76 93 113* 80 74  BUN 9 11 12 12 11   CREATININE 0.88 1.03 1.05 0.92 0.95  CALCIUM 7.9* 7.7* 7.9* 8.0* 7.8*  MG  --  2.0 1.8  --  1.8  PHOS  --  4.1 3.5 3.9 3.4   GFR: Estimated Creatinine Clearance: 86.6 mL/min (by C-G formula based on SCr of  0.95 mg/dL). Liver Function Tests: Recent Labs  Lab 08/13/19 0439 08/14/19 0456 08/15/19 0919 08/16/19 0352  ALBUMIN 2.7* 2.4* 2.4* 2.4*   No results for input(s): LIPASE, AMYLASE in the last 168 hours. No results for input(s): AMMONIA in the last 168 hours. Coagulation Profile: Recent Labs  Lab 08/12/19 1824  INR 1.4*   Cardiac Enzymes: No results for input(s): CKTOTAL, CKMB, CKMBINDEX, TROPONINI in the last 168 hours. BNP (last 3 results) Recent Labs    11/26/18 1025  PROBNP 56.0   HbA1C: No results for input(s): HGBA1C in the last 72 hours. CBG: Recent Labs  Lab 08/15/19 1653 08/15/19 2043 08/16/19 0745 08/16/19 1214 08/16/19 1241  GLUCAP 73 87 67* 83 105*   Lipid Profile: No results for input(s): CHOL, HDL, LDLCALC, TRIG, CHOLHDL, LDLDIRECT in the last 72 hours. Thyroid Function Tests: No results for input(s): TSH, T4TOTAL, FREET4, T3FREE, THYROIDAB in the last 72 hours. Anemia Panel: No results for input(s): VITAMINB12, FOLATE, FERRITIN, TIBC, IRON, RETICCTPCT in the last 72 hours. Sepsis Labs: No results for input(s): PROCALCITON, LATICACIDVEN in the last 168 hours.  Recent Results (from the past 240 hour(s))  MRSA PCR Screening     Status: None   Collection Time: 08/08/19  7:01 AM   Specimen: Nasal Mucosa; Nasopharyngeal  Result Value Ref Range Status   MRSA by PCR NEGATIVE NEGATIVE Final    Comment:        The GeneXpert MRSA Assay (FDA approved for NASAL specimens only), is one component of a comprehensive MRSA colonization surveillance program. It is not intended to diagnose MRSA infection nor to guide or monitor treatment for MRSA infections. Performed at [facility], [place] [address], [place], [place] [address]   Novel Coronavirus, NAA (hospital order; send-out to ref lab)     Status: Abnormal   Collection Time: 08/11/19 11:17 AM   Specimen: Nasopharyngeal Swab; Respiratory  Result Value Ref Range Status   SARS-CoV-2,  NAA DETECTED (A) NOT DETECTED Final    Comment: (NOTE)                  Client Requested Flag This test was developed and its performance characteristics determined by [employer]. This test has not been FDA cleared or approved. This test has been authorized by FDA under an Emergency Use Authorization (EUA). This test is only authorized for the duration of time the declaration that circumstances exist justifying the authorization of the emergency use of in vitro diagnostic tests for detection of SARS-CoV-2 virus and/or diagnosis of COVID-19 infection under section 564(b)(1) of the Act, 21 U.S.C. [id number]), unless the authorization is terminated or  revoked sooner. When diagnostic testing is negative, the possibility of a false negative result should be considered in the context of a patient's recent exposures and the presence of clinical signs and symptoms consistent with COVID-19. An individual without symptoms of COVID-19 and who is not shedding SARS-CoV-2 virus would expect to have a negative (not det ected) result in this assay. Performed At: [facility] [place], [place] [id number] Rush Farmer MD [id number]    [place]  Final    Comment: Performed at [place] [address] [place], [place] [address]  SARS Coronavirus 2 [facility] order, Performed in [facility] hospital lab) Nasopharyngeal Nasopharyngeal Swab     Status: None   Collection Time: 08/13/19  3:42 PM   Specimen: Nasopharyngeal Swab  Result Value Ref Range Status   SARS Coronavirus 2 NEGATIVE NEGATIVE Final    Comment: (NOTE) If result is NEGATIVE SARS-CoV-2 target nucleic acids are NOT DETECTED. The SARS-CoV-2 RNA is generally detectable in upper and lower  respiratory specimens during the acute phase of infection. The lowest  concentration of SARS-CoV-2 viral copies this assay can detect is 250  copies / mL. A negative  result does not preclude SARS-CoV-2 infection  and should not be used as the sole basis for treatment or other  patient management decisions.  A negative result may occur with  improper specimen collection / handling, submission of specimen other  than nasopharyngeal swab, presence of viral mutation(s) within the  areas targeted by this assay, and inadequate number of viral copies  (<250 copies / mL). A negative result must be combined with clinical  observations, patient history, and epidemiological information. If result is POSITIVE SARS-CoV-2 target nucleic acids are DETECTED. The SARS-CoV-2 RNA is generally detectable in upper and lower  respiratory specimens dur ing the acute phase of infection.  Positive  results are indicative of active infection with SARS-CoV-2.  Clinical  correlation with patient history and other diagnostic information is  necessary to determine patient infection status.  Positive results do  not rule out bacterial infection or co-infection with other viruses. If result is PRESUMPTIVE POSTIVE SARS-CoV-2 nucleic acids MAY BE PRESENT.   A presumptive positive result was obtained on the submitted specimen  and confirmed on repeat testing.  While 2019 novel coronavirus  (SARS-CoV-2) nucleic acids may be present in the submitted sample  additional confirmatory testing may be necessary for epidemiological  and / or clinical management purposes  to differentiate between  SARS-CoV-2 and other Sarbecovirus currently known to infect humans.  If clinically indicated additional testing with an alternate test  methodology [phone]) is advised. The SARS-CoV-2 RNA is generally  detectable in upper and lower respiratory sp ecimens during the acute  phase of infection. The expected result is Negative. Fact Sheet for Patients:  StrictlyIdeas.no Fact Sheet for Healthcare Providers: BankingDealers.co.za This test is not yet  approved or cleared by the [place] FDA and has been authorized for detection and/or diagnosis of SARS-CoV-2 by FDA under an Emergency Use Authorization (EUA).  This EUA will remain in effect (meaning this test can be used) for the duration of the COVID-19 declaration under Section 564(b)(1) of the Act, 21 U.S.C. section 360bbb-3(b)(1), unless the authorization is terminated or revoked sooner. Performed at [facility], [place] [address], [place], [place] [address]       Radiology Studies: No results found.   Scheduled Meds: . sodium chloride   Intravenous Once  . sodium  chloride   Intravenous Once  . amiodarone  200 mg Oral Daily  . carvedilol  3.125 mg Oral BID WC  . divalproex  500 mg Oral Daily  . feeding supplement (ENSURE ENLIVE)  237 mL Oral TID BM  . ferrous sulfate  325 mg Oral BID WC  . levETIRAcetam  1,000 mg Oral BID  .  morphine injection  2 mg Intravenous Once  . phenytoin  100 mg Oral BID  . senna-docusate  1 tablet Oral BID  . sodium chloride flush  10-40 mL Intracatheter Q12H  . vitamin B-12  100 mcg Oral Daily   Continuous Infusions: . sodium chloride 75 mL/hr at 08/16/19 0702     LOS: 26 days    Time spent: 25 minutes spent in the coordination of care today.    Jonnie Finner, DO [facility] Pager [phone]  If 7PM-7AM, please contact night-coverage [email or web address] Password [email or web address] 08/16/2019, 3:11 PM

## 2019-08-16 NOTE — Plan of Care (Addendum)
Pt had a good appetite this shift. He had a Lg BM and denied pain. He has however, had some bleeding. Pt rinsed with Amicar @ [id number], but it appears he is not rinsing area well d/t fear of pain. The second time Amicar was administered, it was applied slowly with a syringe. Pt was instructed to refrain from using gauze in his mouth. At this time, 0400, the bleeding has stopped. Pt's HR has been as high as 122, non-sustained.  PRN order of 5 mg of Labetalol was not administered d/t pt's HR not sustaining at or above 120. Will continue to monitor. Pt's mouth began to bleed again.about an hour after the Amicar was administered.

## 2019-08-17 LAB — GLUCOSE, CAPILLARY
Glucose-Capillary: 69 mg/dL — ABNORMAL LOW (ref 70–99)
Glucose-Capillary: 78 mg/dL (ref 70–99)
Glucose-Capillary: 141 mg/dL — ABNORMAL HIGH (ref 70–99)
Glucose-Capillary: 87 mg/dL (ref 70–99)
Glucose-Capillary: 89 mg/dL (ref 70–99)

## 2019-08-17 MED ORDER — ENSURE ENLIVE PO LIQD
237.0000 mL | Freq: Two times a day (BID) | ORAL | Status: DC
  Administered 2019-08-18: 237 mL via ORAL

## 2019-08-17 NOTE — Progress Notes (Signed)
Louis Ford  PROGRESS NOTE    [facility].  [id number] DOB: 08/19/1946 DOA: 07/20/2019 PCP: [name], MD   Brief Narrative:   73 year old with past medical history significant for cardiovascular disease, chronic systolic heart failure ejection fraction 25%, status post AICD, chronic A. 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 Patient presented to the hospital on 7/27 with lightheadedness and dizziness after having tooth removed, bleeding from the mouth and unable to eat properly. Per the patient he has not been on any blood thinner since he had retroperitoneal bleed about 2 months prior to admission. Patient is able to walk with a walker. Also has a history of seizure and is on multiple medications.  Patient had gone to the dentist and had almost all his teeth removed on 07/08/2019, following that he had continued bleeding into his mouth and he had talk to their dentist who recommended to control with gauze piece. Patient has had poor oral intake.   Since hospitalization, patient has required occasional transfusion as well as FFP. Also due to poor oral intake, has episode of hypoglycemia requiring D5 drip. The morning 8/11 patient had another episode of oral bleeding through several pads, although hemoglobin has remained stable. Patient's second COVID test was also negative.  08/17/2019: Patient seen.  Blood sugar control is not optimized.  No significant bleeding is reported.  Last hemoglobin was checked yesterday, 08/16/2019 and was noted to be 8.2 g/dL.    Assessment & Plan:   Principal Problem:   Anemia due to blood loss, acute Active Problems:   HTN (hypertension)   Coronary artery disease involving native heart   Automatic implantable cardioverter-defibrillator in situ   Seizure (HCC)   Renal insufficiency   Factor VII deficiency (HCC)   Chronic systolic CHF (congestive heart failure), NYHA class 2 (HCC)  Chronic kidney disease (CKD), stage III (moderate) (HCC)   Oral bleeding   Coagulopathy ([place])   Hospice care patient   Palliative care patient   Complications due to automatic implantable cardioverter-defibrillator   Anemia due to acute blood loss, symptomatic anemia:  - post operative dental bleeding.  - Patient has required blood transfusion during this admission due to dropping hemoglobin. - He has also required FFP and vitamin K. - Dr. [name] oral surgeon did suture of incision on 8/2; multiple interventions since; going to the OR again today - He has a history of factor VII deficiency/ ?, hematology was consulted. Work-up has been sent out. - Need vitamin K for 10 to 14 day (08/16/19) - Per hematology is some concern for possible C PVC of liver and possible related coagulopathy is recommending vitamin K. - Patient also received Amicar; not compliant on it, needs to use it - S/P 1 unit PRBC 8-14; Hgb is ok - went to OR for hemostasis 8/20; bleeding improved; appreciate oral surgery assistance - Hgb holding at 8.2this AM; he says that he is continuing to bleed ON; he continues to pack his mouth with gauze (again, this is not helping); per nursing, not completely using Amicar 08/17/2019: Further management as per hematology team.  Hypoglycemia: - Secondary to poor oral intake ? Louis Ford - Cortisol level normal at 10. TSH mildly elevated.  - CBG capillary at 60; serum 100  - Appreciate Dr Buddy Duty help. Will need to check protein C and insulin level if blood sugar venous sample less than 55. So far patient has been above 60.  - Patient has been off  of D5 IV fluids. Encourage oral intake. - stable, encourage diet     - venous sugar samples have remained above 60 for days     - Hypoglycemic this AM; intermittently cooperative with diet 08/17/2019: Not optimally controlled.  Hypotension - post surgery likely related to  precedex. - Patient became agitated after surgery in PACU. Was transiently on precex gtt. Subsequently became hypotensive SBP in the 70. Anesthesiology ordered IV bolus. SBP in the high 80.  - Patient also with spike after QRS, unclear if defibrillator is censing correctly. Cardiology has been consulted to evaluate rhythm.  - BP improved.   Possible Ventricular Undersensing;  - Appreciate Cardiology evaluation.  - Defibrillator was interrogated, defibrillator is working normally.  Constipation:  - Schedule Senokot. - Dulcolax PRN  Mild hyperkalemia: - Resolved after 2 doses of lokelma.   Dizziness lightheadedness:  - Likely related to blood loss. - Improved.   Chronic congestive heart failure status post AICD. - Due to soft blood pressure, Aldactone, Coreg, nitrates and hydralazine has been on hold. - Cardiology follow up.  - low-dose carvedilol.  Chronic A. fib: -Continue with amiodarone. - PRN labetalol.  - low-dose carvedilol.  History of seizure disorder: - Continue with Depakote, Keppra , phenytoin.  Dementia without behavioral disturbance:  - Stable   DVT prophylaxis:SCDs Code Status:FULL Disposition Plan:TBD  Consultants:  Oral Surgery  Hematology  Cardiology  Endocrine  Subjective: No new complaints. Blood sugar control is not optimized.  Objective: Vitals:   08/16/19 2100 08/16/19 2117 08/17/19 0457 08/17/19 1331  BP:  96/74 101/68 99/69  Pulse: (!) 101 (!) 119 77 75  Resp: (!) 21 20 14 20   Temp:  98.4 F (36.9 C) 98.1 F (36.7 C) 98.6 F (37 C)  TempSrc:  Oral Oral Oral  SpO2: 100% 99% 100% 100%  Weight:      Height:        Intake/Output Summary (Last 24 hours) at 08/17/2019 1814 Last data filed at 08/17/2019 1300 Gross per 24 hour  Intake 1248.34 ml  Output 1150 ml  Net 98.34 ml   Filed Weights   08/10/19 0146 08/14/19 0352 08/15/19 0500   Weight: 95.9 kg 98.7 kg 98.5 kg    Examination:  General: 73 y.o. male resting in bed in NAD Eyes: PERRL, normal sclera ENMT: Nares patent w/o discharge, orophaynx clear, dentition normal, ears w/o discharge/lesions/ulcers Cardiovascular: RRR, +S1, S2, no m/g/r, equal pulses throughout Respiratory: CTABL, no w/r/r, normal WOB GI: BS+, NDNT, no masses noted, no organomegaly noted MSK: No e/c/c Skin: No rashes, bruises, ulcerations noted Neuro: A&O x 3, no focal deficits Psyc: Appropriate interaction and affect, calm/cooperative   Data Reviewed: I have personally reviewed following labs and imaging studies.  CBC: Recent Labs  Lab 08/12/19 0508 08/13/19 0439 08/14/19 0456 08/15/19 0919 08/16/19 0352  WBC 4.9 6.1 4.8 4.2 4.6  NEUTROABS  --  4.2 2.7 2.3 2.3  HGB 8.7* 8.2* 7.7* 7.9* 8.2*  HCT 28.9* 27.5* 25.6* 26.6* 27.1*  MCV 96.7 97.9 98.1 97.1 97.1  PLT 210 220 185 204 [id number]   Basic Metabolic Panel: Recent Labs  Lab 08/12/19 0508 08/13/19 0439 08/14/19 0456 08/15/19 0919 08/16/19 0352  NA 138 136 139 139 139  K 4.7 4.9 4.7 4.5 4.9  CL 113* 111 113* 113* 115*  CO2 22 22 23 22 22   GLUCOSE 76 93 113* 80 74  BUN 9 11 12 12 11   CREATININE 0.88 1.03 1.05 0.92 0.95  CALCIUM 7.9* 7.7*  7.9* 8.0* 7.8*  MG  --  2.0 1.8  --  1.8  PHOS  --  4.1 3.5 3.9 3.4   GFR: Estimated Creatinine Clearance: 86.6 mL/min (by C-G formula based on SCr of 0.95 mg/dL). Liver Function Tests: Recent Labs  Lab 08/13/19 0439 08/14/19 0456 08/15/19 0919 08/16/19 0352  ALBUMIN 2.7* 2.4* 2.4* 2.4*   No results for input(s): LIPASE, AMYLASE in the last 168 hours. No results for input(s): AMMONIA in the last 168 hours. Coagulation Profile: Recent Labs  Lab 08/12/19 1824  INR 1.4*   Cardiac Enzymes: No results for input(s): CKTOTAL, CKMB, CKMBINDEX, TROPONINI in the last 168 hours. BNP (last 3 results) Recent Labs    11/26/18 1025  PROBNP 56.0   HbA1C: No results for input(s):  HGBA1C in the last 72 hours. CBG: Recent Labs  Lab 08/16/19 2113 08/17/19 0750 08/17/19 0849 08/17/19 1118 08/17/19 1649  GLUCAP 92 69* 78 141* 87   Lipid Profile: No results for input(s): CHOL, HDL, LDLCALC, TRIG, CHOLHDL, LDLDIRECT in the last 72 hours. Thyroid Function Tests: No results for input(s): TSH, T4TOTAL, FREET4, T3FREE, THYROIDAB in the last 72 hours. Anemia Panel: No results for input(s): VITAMINB12, FOLATE, FERRITIN, TIBC, IRON, RETICCTPCT in the last 72 hours. Sepsis Labs: No results for input(s): PROCALCITON, LATICACIDVEN in the last 168 hours.  Recent Results (from the past 240 hour(s))  MRSA PCR Screening     Status: None   Collection Time: 08/08/19  7:01 AM   Specimen: Nasal Mucosa; Nasopharyngeal  Result Value Ref Range Status   MRSA by PCR NEGATIVE NEGATIVE Final    Comment:        The GeneXpert MRSA Assay (FDA approved for NASAL specimens only), is one component of a comprehensive MRSA colonization surveillance program. It is not intended to diagnose MRSA infection nor to guide or monitor treatment for MRSA infections. Performed at [facility], [place] [address] [place], [place] [address]   Novel Coronavirus, NAA (hospital order; send-out to ref lab)     Status: Abnormal   Collection Time: 08/11/19 11:17 AM   Specimen: Nasopharyngeal Swab; Respiratory  Result Value Ref Range Status   SARS-CoV-2, NAA DETECTED (A) NOT DETECTED Final    Comment: (NOTE)                  Client Requested Flag This test was developed and its performance characteristics determined by [employer]. This test has not been FDA cleared or approved. This test has been authorized by FDA under an Emergency Use Authorization (EUA). This test is only authorized for the duration of time the declaration that circumstances exist justifying the authorization of the emergency use of in vitro diagnostic tests for detection of SARS-CoV-2 virus and/or  diagnosis of COVID-19 infection under section 564(b)(1) of the Act, 21 U.S.C. [id number]), unless the authorization is terminated or revoked sooner. When diagnostic testing is negative, the possibility of a false negative result should be considered in the context of a patient's recent exposures and the presence of clinical signs and symptoms consistent with COVID-19. An individual without symptoms of COVID-19 and who is not shedding SARS-CoV-2 virus would expect to have a negative (not det ected) result in this assay. Performed At: [facility] [place], [place] [id number] Rush Farmer MD [id number]    [place]  Final    Comment: Performed at [place] [address], [place], [place] [address]  SARS Coronavirus 2 [facility] order,  Performed in [facility] hospital lab) Nasopharyngeal Nasopharyngeal Swab     Status: None   Collection Time: 08/13/19  3:42 PM   Specimen: Nasopharyngeal Swab  Result Value Ref Range Status   SARS Coronavirus 2 NEGATIVE NEGATIVE Final    Comment: (NOTE) If result is NEGATIVE SARS-CoV-2 target nucleic acids are NOT DETECTED. The SARS-CoV-2 RNA is generally detectable in upper and lower  respiratory specimens during the acute phase of infection. The lowest  concentration of SARS-CoV-2 viral copies this assay can detect is 250  copies / mL. A negative result does not preclude SARS-CoV-2 infection  and should not be used as the sole basis for treatment or other  patient management decisions.  A negative result may occur with  improper specimen collection / handling, submission of specimen other  than nasopharyngeal swab, presence of viral mutation(s) within the  areas targeted by this assay, and inadequate number of viral copies  (<250 copies / mL). A negative result must be combined with clinical  observations, patient history, and epidemiological information. If result is  POSITIVE SARS-CoV-2 target nucleic acids are DETECTED. The SARS-CoV-2 RNA is generally detectable in upper and lower  respiratory specimens dur ing the acute phase of infection.  Positive  results are indicative of active infection with SARS-CoV-2.  Clinical  correlation with patient history and other diagnostic information is  necessary to determine patient infection status.  Positive results do  not rule out bacterial infection or co-infection with other viruses. If result is PRESUMPTIVE POSTIVE SARS-CoV-2 nucleic acids MAY BE PRESENT.   A presumptive positive result was obtained on the submitted specimen  and confirmed on repeat testing.  While 2019 novel coronavirus  (SARS-CoV-2) nucleic acids may be present in the submitted sample  additional confirmatory testing may be necessary for epidemiological  and / or clinical management purposes  to differentiate between  SARS-CoV-2 and other Sarbecovirus currently known to infect humans.  If clinically indicated additional testing with an alternate test  methodology [phone]) is advised. The SARS-CoV-2 RNA is generally  detectable in upper and lower respiratory sp ecimens during the acute  phase of infection. The expected result is Negative. Fact Sheet for Patients:  StrictlyIdeas.no Fact Sheet for Healthcare Providers: BankingDealers.co.za This test is not yet approved or cleared by the [place] FDA and has been authorized for detection and/or diagnosis of SARS-CoV-2 by FDA under an Emergency Use Authorization (EUA).  This EUA will remain in effect (meaning this test can be used) for the duration of the COVID-19 declaration under Section 564(b)(1) of the Act, 21 U.S.C. section 360bbb-3(b)(1), unless the authorization is terminated or revoked sooner. Performed at [facility], [place] [address] [place], [place] [address]       Radiology Studies: No results  found.   Scheduled Meds: . sodium chloride   Intravenous Once  . sodium chloride   Intravenous Once  . amiodarone  200 mg Oral Daily  . carvedilol  3.125 mg Oral BID WC  . divalproex  500 mg Oral Daily  . [START ON 08/18/2019] feeding supplement (ENSURE ENLIVE)  237 mL Oral BID BM  . ferrous sulfate  325 mg Oral BID WC  . levETIRAcetam  1,000 mg Oral BID  .  morphine injection  2 mg Intravenous Once  . phenytoin  100 mg Oral BID  . senna-docusate  1 tablet Oral BID  . sodium chloride flush  10-40 mL Intracatheter Q12H  . vitamin B-12  100 mcg Oral Daily  Continuous Infusions: . sodium chloride 75 mL/hr at 08/16/19 1833     LOS: 27 days    Time spent: 25 minutes    Bonnell Public, MD [facility] Pager [phone]  If 7PM-7AM, please contact night-coverage [email or web address] Password [facility] 08/17/2019, 6:14 PM

## 2019-08-17 NOTE — Consult Note (Signed)
   [place] Inpatient Consult   08/17/2019  [name]. 1946-10-03 [id number]   Referral received from inpatient Advanced Transitions Care Supervisor for [facility] [employer] community care manager follow-up when patient transitions home. Patient has extreme unplanned readmission risk score, 30%, and 2 hospitalizations within past 6 months.   Will continue to follow for progression and disposition plans.  Of note, [facility] Care Management services does not replace or interfere with any services that are arranged by inpatient case management or social work.  Netta Cedars, MSN, [facility] Liaison Nurse Mobile Phone [phone]  Toll free office [phone]

## 2019-08-17 NOTE — Plan of Care (Signed)

## 2019-08-17 NOTE — Plan of Care (Signed)

## 2019-08-17 NOTE — Progress Notes (Signed)
Physical Therapy Treatment Patient Details Name: [facility]. MRN: [id number] DOB: [birth year] Today's Date: 08/17/2019    History of Present Illness [facility]. is a 73 y.o. 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  Pt s/p resuturing of oral wounds on 08/08/19.  Pt also with Biotronik ICD with Possible Ventricular Undersensing and cardiology following.    PT Comments    Pt assisted with ambulating in hallway.  Pt required a few standing rest breaks due to fatigue and HR 122 bpm during ambulation.  If spouse unable to assist at home, pt may need SNF.   Follow Up Recommendations  Home health PT;Supervision/Assistance - 24 hour(if spouse not able to assist then SNF)     Equipment Recommendations  Rolling walker with 5" wheels    Recommendations for Other Services       Precautions / Restrictions Precautions Precautions: Fall Precaution Comments: monitor HR    Mobility  Bed Mobility Overal bed mobility: Needs Assistance Bed Mobility: Supine to Sit;Sit to Supine     Supine to sit: Supervision Sit to supine: Supervision   General bed mobility comments: supervision for lines  Transfers Overall transfer level: Needs assistance Equipment used: Rolling walker (2 wheeled) Transfers: Sit to/from Stand Sit to Stand: Min assist         General transfer comment: verbal cues for hand placement, assist to rise and steady  Ambulation/Gait Ambulation/Gait assistance: Min guard Gait Distance (Feet): 160 Feet Assistive device: Rolling walker (2 wheeled) Gait  Pattern/deviations: Step-through pattern;Decreased stride length;Antalgic;Trunk flexed     General Gait Details: pt tends to ambulate with forearms on RW and trunk flexion; HR 122 bpm, required 3-4 standing rest breaks   Stairs             Wheelchair Mobility    Modified Rankin (Stroke Patients Only)       Balance                                            Cognition Arousal/Alertness: Awake/alert Behavior During Therapy: WFL for tasks assessed/performed Overall Cognitive Status: Within Functional Limits for tasks assessed                                        Exercises      General Comments        Pertinent Vitals/Pain Pain Assessment: Faces Faces Pain Scale: Hurts little more Pain Location: generalized Pain Descriptors / Indicators: Aching Pain Intervention(s): Monitored during session;Repositioned    Home Living                      Prior Function            PT Goals (current goals can now be found in the care plan section) Progress towards PT goals: Progressing toward goals    Frequency    Min 2X/week      PT Plan Current plan remains appropriate  Co-evaluation              AM-PAC PT "6 Clicks" Mobility   Outcome Measure  Help needed turning from your back to your side while in a flat bed without using bedrails?: A Little Help needed moving from lying on your back to sitting on the side of a flat bed without using bedrails?: A Little Help needed moving to and from a bed to a chair (including a wheelchair)?: A Little Help needed standing up from a chair using your arms (e.g., wheelchair or bedside chair)?: A Little Help needed to walk in hospital room?: A Little Help needed climbing 3-5 steps with a railing? : A Lot 6 Click Score: 17    End of Session   Activity Tolerance: Patient tolerated treatment well Patient left: in bed;with call bell/phone within reach;with bed alarm  set Nurse Communication: Mobility status PT Visit Diagnosis: Difficulty in walking, not elsewhere classified (R26.2);Muscle weakness (generalized) (M62.81)     Time: [id number] PT Time Calculation (min) (ACUTE ONLY): 18 min  Charges:  $Gait Training: 8-22 mins                     Carmelia Bake, PT, DPT Acute Rehabilitation Services Office: [phone] Pager: [phone]  Trena Platt 08/17/2019, 1:58 PM

## 2019-08-17 NOTE — Progress Notes (Addendum)
Nutrition Follow-up  RD working remotely.   DOCUMENTATION CODES:   Not applicable  INTERVENTION:  - continue Ensure Enlive but will decrease from TID to BID. - continue Magic Cup BID.  - continue to encourage PO intakes.  - recommend Ca supplementation as serum Ca has been persistently <8 mg/dl.    NUTRITION DIAGNOSIS:   Inadequate oral intake related to acute illness, mouth pain as evidenced by per patient/family report. -improving  GOAL:   Patient will meet greater than or equal to 90% of their needs -minimally met on average  MONITOR:   PO intake, Supplement acceptance, Diet advancement, Labs, Weight trends  ASSESSMENT:   73 y.o. 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 He presented to the ED d/t lightheadedness and dizziness, bleeding from the mouth, and poor oral intakes. Patient denied being on blood thinners since retroperitoneal bleed ~2 months ago. He had 18 teeth removed by his dentist on 07/08/19 and following that time continued to have oral bleeding.  Per review, patient has been eating 75-100% on average for the past 4 days. He was NPO on 8/19 and 8/20 for oral surgery d/t persistent, intermittent oral bleeding. Debridement and re-suture was done at that time. Despite this, he continues to have oral bleeding.  Per notes, possible plan for SNF when d/c is feasible.   Noted that serum Ca has been <8 mg/dl for at least the past 2 weeks. No supplementation currently ordered. Prothrombin time checked on 8/19 and was 17.3 seconds (reference range: 11.4-15.2 seconds). It does not appear that a vitamin K lab has been checked this admission.  Labs reviewed; CBGs: 69, 78, and 141 mg/dl today, Ca: 7.8 mg/dl. Medications reviewed; 325 mg ferrous sulfate BID, 1 tablet senokot BID, 100  mcg oral cyanocobalamin/day.    Diet Order:   Diet Order            DIET SOFT Room service appropriate? Yes; Fluid consistency: Thin  Diet effective now              EDUCATION NEEDS:   No education needs have been identified at this time  Skin:  Skin Assessment: Reviewed RN Assessment  Last BM:  8/24  Height:   Ht Readings from Last 1 Encounters:  07/20/19 5' 10.98" (1.803 m)    Weight:   Wt Readings from Last 1 Encounters:  08/15/19 98.5 kg    Ideal Body Weight:  78.2 kg  BMI:  Body mass index is 30.3 kg/m.  Estimated Nutritional Needs:   Kcal:  1820-2085 kcal  Protein:  80-90 grams  Fluid:  >/= 2 L/day     Jarome Matin, MS, RD, LDN, [facility] Inpatient Clinical Dietitian Pager # [phone] After hours/weekend pager # [phone]

## 2019-08-18 LAB — GLUCOSE, CAPILLARY
Glucose-Capillary: 49 mg/dL — ABNORMAL LOW (ref 70–99)
Glucose-Capillary: 88 mg/dL (ref 70–99)
Glucose-Capillary: 74 mg/dL (ref 70–99)
Glucose-Capillary: 68 mg/dL — ABNORMAL LOW (ref 70–99)
Glucose-Capillary: 92 mg/dL (ref 70–99)
Glucose-Capillary: 86 mg/dL (ref 70–99)

## 2019-08-18 MED ORDER — DEXTROSE 50 % IV SOLN
INTRAVENOUS | Status: AC
  Administered 2019-08-18: 25 mL
  Filled 2019-08-18: qty 50

## 2019-08-18 NOTE — Progress Notes (Signed)
Hypoglycemic Event  CBG: 49  Treatment: 4 oz juice/soda  Symptoms: None  Follow-up CBG: Time:0758 CBG Result:88  Possible Reasons for Event: Inadequate meal intake  Comments/MD notified MD Buriev notified.    Rosana Fret

## 2019-08-18 NOTE — Progress Notes (Signed)
OT Cancellation Note  Patient Details Name: [facility]. MRN: [id number] DOB: 12/03/1946   Cancelled Treatment:    Reason Eval/Treat Not Completed: Patient declined, no reason specified;Other (comment)(fatigue/lethargic). Pt received supine in bed, eyes closed more often than not even when answering questions. Pt reports he does not feel up for therapy right now, request OT reattempt tomorrow. Noted hypoglycemic event this morning. Plan to reattempt tomorrow.   Tyrone Schimke, OT Acute Rehabilitation Services Pager: [phone] Office: [phone]  08/18/2019, 1:31 PM

## 2019-08-18 NOTE — Progress Notes (Addendum)
CCMD called to inform this nurse of elevated ST on patient tele. Pt asymptomatic and resting in bed. Vitals stable. EKG performed showing Afib with RVR. MD Buriev made aware. No new orders. Will continue to monitor.

## 2019-08-18 NOTE — Progress Notes (Signed)
Patient states he does not want me to call his wife with update today. Will continue to monitor.

## 2019-08-18 NOTE — Progress Notes (Signed)
Hypoglycemic Event  CBG: 68  Treatment: D50 25 mL (12.5 gm)  Symptoms: None  Follow-up CBG: Time:1324 CBG Result:92  Possible Reasons for Event: Inadequate meal intake  Comments/MD notified: MD Buriev aware. ACHS check 1st time 21, due to frequent hypoglycemic events, juice given. At recheck, CBG of 68. 1/2 amp D50 given at 1248, delay due to the need for PICC access. Recheck CBG 92.    Rosana Fret

## 2019-08-18 NOTE — Progress Notes (Signed)
[facility] PROGRESS NOTE  [facility]. [id number] DOB: February 26, 1946 DOA: 07/20/2019 PCP: [name], MD  Brief summary   73 year old with past medical history significant for cardiovascular disease, chronic systolic heart failure ejection fraction 25%, status post AICD, chronic A. 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 Patient presented to the hospital on 7/27 with lightheadedness and dizziness after having tooth removed, bleeding from the mouth and unable to eat properly. Per the patient he has not been on any blood thinner since he had retroperitoneal bleed about 2 months prior to admission. Patient is able to walk with a walker. Also has a history of seizure and is on multiple medications.  Patient had gone to the dentist and had almost all his teeth removed on 07/08/2019, following that he had continued bleeding into his mouth and he had talk to their dentist who recommended to control with gauze piece. Patient has had poor oral intake.   Since hospitalization, patient has required occasional transfusion as well as FFP. Also due to poor oral intake, has episode of hypoglycemia requiring D5 drip. The morning 8/11 patient had another episode of oral bleeding through several pads, although hemoglobin has remained stable. Patient's second COVID test was also negative.  Assessment/Plan:  Anemia due to acute blood loss, symptomatic anemia:  - post operative dental bleeding.  - Patient has required blood transfusion during this admission due to dropping hemoglobin. - He has also required FFP and vitamin K. - Dr. [name] oral surgeon did suture of incision on 8/2; multiple interventions since;  - He has a history of factor VII deficiency/ ?, hematology was consulted. Work-up has been sent out. 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 - Patient also received Amicar; not compliant on it, needs to use it - S/P 1 unit PRBC 8-14;  - went to OR for hemostasis8/20; bleeding improved;appreciate oral surgery assistance - Hg remains relatively stable/ Further management as per hematology team.  Hypoglycemia: - Secondary to poor oral intake vs liver related . - act stim test with good response. TSH mildly elevated.  - Appreciate Dr Buddy Duty help. Will need to check protein C and insulin level if blood sugar venous sample less than 55. So far patient has been above 60.  - Encourage oral intake. D/c iv fluid today. Monitor glucose for 24 hrs    Hypotension - post surgery likely related to precedex. - Patient became agitated after surgery in PACU. Was transiently on precex gtt. Subsequently became hypotensive SBP in the 70. Anesthesiology ordered IV bolus. SBP in the high 80.  - Patient also with spike after QRS, unclear if defibrillator is censing correctly. Cardiology has been consulted to evaluate rhythm.  - BP improved.   Possible Ventricular Undersensing;  - Appreciate Cardiology evaluation.  - Defibrillator was interrogated, defibrillator is working normally.  Constipation:  - resolved. Schedule Senokot. - Dulcolax PRN  Mild hyperkalemia: - Resolved after 2 doses of lokelma.   Dizziness lightheadedness:  - Likely related to blood loss. - Improved.   Chronic congestive heart failure status post AICD. - Due to soft blood pressure, Aldactone, Coreg, nitrates and hydralazine has been on hold. - Cardiology follow up.  - low-dose carvedilol.  Chronic A. fib: -Continue with amiodarone. - PRN labetalol.  - low-dose carvedilol. Not a candidate for anticoagulation due to  bleeding risk   History of seizure disorder: -  Continue with Depakote, Keppra , phenytoin.  Dementia without behavioral disturbance:  - Stable  Code Status: full Family Communication: d/w patient, RN (indicate person spoken with, relationship, and if by phone, the number) Disposition Plan: d/c iv fluids today. Monitor BP, glucose for 24 hrs. Possible discharge AM   Consultants:  Oral Surgery  Hematology  Cardiology  Endocrine  Procedures:  As above   Antibiotics: Anti-infectives (From admission, onward)   Start     Dose/Rate Route Frequency Ordered Stop   08/13/19 0600  ceFAZolin (ANCEF) IVPB 2g/100 mL premix     2 g 200 mL/hr over 30 Minutes Intravenous On call to O.R. 08/12/19 1359 08/12/19 1544   08/12/19 1509  ceFAZolin (ANCEF) 2-4 GM/100ML-% IVPB    Note to Pharmacy: Marla Roe   : cabinet override      08/12/19 1509 08/12/19 1544   08/08/19 0600  ceFAZolin (ANCEF) IVPB 2g/100 mL premix     2 g 200 mL/hr over 30 Minutes Intravenous On call to O.R. 08/07/19 1724 08/08/19 0959   07/26/19 1602  ceFAZolin (ANCEF) 2-4 GM/100ML-% IVPB    Note to Pharmacy: Darlys Gales   : cabinet override      07/26/19 1602 07/26/19 1626   07/26/19 0845  ceFAZolin (ANCEF) IVPB 2g/100 mL premix     2 g 200 mL/hr over 30 Minutes Intravenous On call to O.R. 07/26/19 [id number] 07/26/19 1631   07/21/19 0845  amoxicillin (AMOXIL) capsule 500 mg     500 mg Oral Every 8 hours 07/21/19 0837 07/24/19 2203   07/20/19 2200  amoxicillin (AMOXIL) capsule 500 mg  Status:  Discontinued     500 mg Oral 2 times daily 07/20/19 1801 07/21/19 0837        (indicate start date, and stop date if known)  HPI/Subjective: Reports feeling well. Poor appetite. No vomiting. No acute abdominal pains.  -will d/c IV fluids. encouraged PO intake   Objective: Vitals:   08/17/19 2111 08/18/19 0459  BP: 110/79 111/82  Pulse: 79 80  Resp: 16 16  Temp: 98.1 F (36.7 C) 97.7 F (36.5 C)  SpO2: 100% 99%    Intake/Output Summary (Last 24 hours)  at 08/18/2019 0932 Last data filed at 08/18/2019 [id number] Gross per 24 hour  Intake 1390 ml  Output 1200 ml  Net 190 ml   Filed Weights   08/14/19 0352 08/15/19 0500 08/18/19 0500  Weight: 98.7 kg 98.5 kg 102.5 kg    Exam:   General:  No acute distress   Cardiovascular: s1,s2 rrr  Respiratory: CTA BL  Abdomen:  Soft, nt   Musculoskeletal: mild ankle edema    Data Reviewed: Basic Metabolic Panel: Recent Labs  Lab 08/12/19 0508 08/13/19 0439 08/14/19 0456 08/15/19 0919 08/16/19 0352  NA 138 136 139 139 139  K 4.7 4.9 4.7 4.5 4.9  CL 113* 111 113* 113* 115*  CO2 22 22 23 22 22   GLUCOSE 76 93 113* 80 74  BUN 9 11 12 12 11   CREATININE 0.88 1.03 1.05 0.92 0.95  CALCIUM 7.9* 7.7* 7.9* 8.0* 7.8*  MG  --  2.0 1.8  --  1.8  PHOS  --  4.1 3.5 3.9 3.4   Liver Function Tests: Recent Labs  Lab 08/13/19 0439 08/14/19 0456 08/15/19 0919 08/16/19 0352  ALBUMIN 2.7* 2.4* 2.4* 2.4*   No results for input(s): LIPASE, AMYLASE in the last 168 hours. No results for input(s): AMMONIA  in the last 168 hours. CBC: Recent Labs  Lab 08/12/19 0508 08/13/19 0439 08/14/19 0456 08/15/19 0919 08/16/19 0352  WBC 4.9 6.1 4.8 4.2 4.6  NEUTROABS  --  4.2 2.7 2.3 2.3  HGB 8.7* 8.2* 7.7* 7.9* 8.2*  HCT 28.9* 27.5* 25.6* 26.6* 27.1*  MCV 96.7 97.9 98.1 97.1 97.1  PLT 210 220 185 204 216   Cardiac Enzymes: No results for input(s): CKTOTAL, CKMB, CKMBINDEX, TROPONINI in the last 168 hours. BNP (last 3 results) Recent Labs    12/06/18 1917 07/21/19 1129  BNP 36.4 40.2    ProBNP (last 3 results) Recent Labs    11/26/18 1025  PROBNP 56.0    CBG: Recent Labs  Lab 08/17/19 1118 08/17/19 1649 08/17/19 2108 08/18/19 0719 08/18/19 0758  GLUCAP 141* 87 89 49* 88    Recent Results (from the past 240 hour(s))  Novel Coronavirus, NAA (hospital order; send-out to ref lab)     Status: Abnormal   Collection Time: 08/11/19 11:17 AM   Specimen: Nasopharyngeal Swab; Respiratory   Result Value Ref Range Status   SARS-CoV-2, NAA DETECTED (A) NOT DETECTED Final    Comment: (NOTE)                  Client Requested Flag This test was developed and its performance characteristics determined by [employer]. This test has not been FDA cleared or approved. This test has been authorized by FDA under an Emergency Use Authorization (EUA). This test is only authorized for the duration of time the declaration that circumstances exist justifying the authorization of the emergency use of in vitro diagnostic tests for detection of SARS-CoV-2 virus and/or diagnosis of COVID-19 infection under section 564(b)(1) of the Act, 21 U.S.C. [id number]), unless the authorization is terminated or revoked sooner. When diagnostic testing is negative, the possibility of a false negative result should be considered in the context of a patient's recent exposures and the presence of clinical signs and symptoms consistent with COVID-19. An individual without symptoms of COVID-19 and who is not shedding SARS-CoV-2 virus would expect to have a negative (not det ected) result in this assay. Performed At: [facility] [place], [place] [id number] Rush Farmer MD [id number]    [place]  Final    Comment: Performed at [place] [address] [place], [place] [address]  SARS Coronavirus 2 [facility] order, Performed in [facility] hospital lab) Nasopharyngeal Nasopharyngeal Swab     Status: None   Collection Time: 08/13/19  3:42 PM   Specimen: Nasopharyngeal Swab  Result Value Ref Range Status   SARS Coronavirus 2 NEGATIVE NEGATIVE Final    Comment: (NOTE) If result is NEGATIVE SARS-CoV-2 target nucleic acids are NOT DETECTED. The SARS-CoV-2 RNA is generally detectable in upper and lower  respiratory specimens during the acute phase of infection. The lowest  concentration of SARS-CoV-2 viral copies this assay  can detect is 250  copies / mL. A negative result does not preclude SARS-CoV-2 infection  and should not be used as the sole basis for treatment or other  patient management decisions.  A negative result may occur with  improper specimen collection / handling, submission of specimen other  than nasopharyngeal swab, presence of viral mutation(s) within the  areas targeted by this assay, and inadequate number of viral copies  (<250 copies / mL). A negative result must be combined with clinical  observations, patient history, and epidemiological information. If result is POSITIVE SARS-CoV-2  target nucleic acids are DETECTED. The SARS-CoV-2 RNA is generally detectable in upper and lower  respiratory specimens dur ing the acute phase of infection.  Positive  results are indicative of active infection with SARS-CoV-2.  Clinical  correlation with patient history and other diagnostic information is  necessary to determine patient infection status.  Positive results do  not rule out bacterial infection or co-infection with other viruses. If result is PRESUMPTIVE POSTIVE SARS-CoV-2 nucleic acids MAY BE PRESENT.   A presumptive positive result was obtained on the submitted specimen  and confirmed on repeat testing.  While 2019 novel coronavirus  (SARS-CoV-2) nucleic acids may be present in the submitted sample  additional confirmatory testing may be necessary for epidemiological  and / or clinical management purposes  to differentiate between  SARS-CoV-2 and other Sarbecovirus currently known to infect humans.  If clinically indicated additional testing with an alternate test  methodology [phone]) is advised. The SARS-CoV-2 RNA is generally  detectable in upper and lower respiratory sp ecimens during the acute  phase of infection. The expected result is Negative. Fact Sheet for Patients:  StrictlyIdeas.no Fact Sheet for Healthcare  Providers: BankingDealers.co.za This test is not yet approved or cleared by the [place] FDA and has been authorized for detection and/or diagnosis of SARS-CoV-2 by FDA under an Emergency Use Authorization (EUA).  This EUA will remain in effect (meaning this test can be used) for the duration of the COVID-19 declaration under Section 564(b)(1) of the Act, 21 U.S.C. section 360bbb-3(b)(1), unless the authorization is terminated or revoked sooner. Performed at [facility], [place] [address] [place], [place] [address]      Studies: No results found.  Scheduled Meds: . sodium chloride   Intravenous Once  . sodium chloride   Intravenous Once  . amiodarone  200 mg Oral Daily  . carvedilol  3.125 mg Oral BID WC  . divalproex  500 mg Oral Daily  . feeding supplement (ENSURE ENLIVE)  237 mL Oral BID BM  . ferrous sulfate  325 mg Oral BID WC  . levETIRAcetam  1,000 mg Oral BID  .  morphine injection  2 mg Intravenous Once  . phenytoin  100 mg Oral BID  . senna-docusate  1 tablet Oral BID  . sodium chloride flush  10-40 mL Intracatheter Q12H  . vitamin B-12  100 mcg Oral Daily   Continuous Infusions: . sodium chloride 75 mL/hr at 08/17/19 2138    Principal Problem:   Anemia due to blood loss, acute Active Problems:   HTN (hypertension)   Coronary artery disease involving native heart   Automatic implantable cardioverter-defibrillator in situ   Seizure (HCC)   Renal insufficiency   Factor VII deficiency (HCC)   Chronic systolic CHF (congestive heart failure), NYHA class 2 (HCC)   Chronic kidney disease (CKD), stage III (moderate) (HCC)   Oral bleeding   Coagulopathy ([place])   Hospice care patient   Palliative care patient   Complications due to automatic implantable cardioverter-defibrillator    Time spent: >25 minutes     Kinnie Feil  [facility] Pager [phone]. If 7PM-7AM, please contact night-coverage at  [email or web address], password [facility] 08/18/2019, 9:32 AM  LOS: 28 days

## 2019-08-19 ENCOUNTER — Inpatient Hospital Stay (HOSPITAL_COMMUNITY): Payer: Medicare Other

## 2019-08-19 DIAGNOSIS — R609 Edema, unspecified: Secondary | ICD-10-CM

## 2019-08-19 LAB — GLUCOSE, CAPILLARY
Glucose-Capillary: 112 mg/dL — ABNORMAL HIGH (ref 70–99)
Glucose-Capillary: 65 mg/dL — ABNORMAL LOW (ref 70–99)
Glucose-Capillary: 67 mg/dL — ABNORMAL LOW (ref 70–99)
Glucose-Capillary: 98 mg/dL (ref 70–99)
Glucose-Capillary: 84 mg/dL (ref 70–99)
Glucose-Capillary: 87 mg/dL (ref 70–99)
Glucose-Capillary: 78 mg/dL (ref 70–99)

## 2019-08-19 LAB — CBC
HCT: 28.7 % — ABNORMAL LOW (ref 39.0–52.0)
Hemoglobin: 8.9 g/dL — ABNORMAL LOW (ref 13.0–17.0)
MCH: 30.5 pg (ref 26.0–34.0)
MCHC: 31 g/dL (ref 30.0–36.0)
MCV: 98.3 fL (ref 80.0–100.0)
Platelets: 223 10*3/uL (ref 150–400)
RBC: 2.92 MIL/uL — ABNORMAL LOW (ref 4.22–5.81)
RDW: 18.6 % — ABNORMAL HIGH (ref 11.5–15.5)
WBC: 4.7 10*3/uL (ref 4.0–10.5)
nRBC: 1.7 % — ABNORMAL HIGH (ref 0.0–0.2)

## 2019-08-19 MED ORDER — AMIODARONE HCL 200 MG PO TABS
200.0000 mg | ORAL_TABLET | Freq: Two times a day (BID) | ORAL | Status: DC
  Administered 2019-08-19 – 2019-08-21 (×4): 200 mg via ORAL
  Filled 2019-08-19 (×4): qty 1

## 2019-08-19 MED ORDER — DEXTROSE 50 % IV SOLN
INTRAVENOUS | Status: AC
  Administered 2019-08-19: 25 mL
  Filled 2019-08-19: qty 50

## 2019-08-19 MED ORDER — ENSURE ENLIVE PO LIQD
237.0000 mL | Freq: Four times a day (QID) | ORAL | Status: DC
  Administered 2019-08-19 – 2019-08-20 (×3): 237 mL via ORAL

## 2019-08-19 NOTE — Progress Notes (Signed)
Left upper extremity venous duplex has been completed. Preliminary results can be found in CV Proc through chart review.   08/19/19 3:41 PM Carlos Levering RVT

## 2019-08-19 NOTE — Progress Notes (Signed)
OT Cancellation Note  Patient Details Name: [facility]. MRN: [id number] DOB: [birth year]   Cancelled Treatment:    Reason Eval/Treat Not Completed: Other (comment).  IV RN was with pt. Will try to return tomorrow.  Shelba Susi 08/19/2019, 3:42 PM  Lesle Chris, OTR/L Acute Rehabilitation Services [phone] [facility] pager [phone] office 08/19/2019

## 2019-08-19 NOTE — Progress Notes (Signed)
Received consult to assess PICC: Site patent, brisk blood return. Flushes easily. LUE edema noted. Reason for swelling unclear.

## 2019-08-19 NOTE — Progress Notes (Addendum)
Physical Therapy Treatment Patient Details Name: [facility]. MRN: [id number] DOB: Feb 29, 1946 Today's Date: 08/19/2019    History of Present Illness [facility]. is a 73 y.o. 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  Pt s/p resuturing of oral wounds on 08/08/19.  Pt also with Biotronik ICD with Possible Ventricular Undersensing and cardiology following.    PT Comments    Noted LUE appears swollen, RN notified. Pt ambulated 150' with RW with 3 standing rest breaks, HR 145 max while ambulating, HR 117 at rest.   Follow Up Recommendations  Home health PT;Supervision/Assistance - 24 hour(if spouse not able to assist then SNF)     Equipment Recommendations  Rolling walker with 5" wheels    Recommendations for Other Services       Precautions / Restrictions Precautions Precautions: Fall Precaution Comments: monitor HR Restrictions Weight Bearing Restrictions: No    Mobility  Bed Mobility Overal bed mobility: Modified Independent Bed Mobility: Supine to Sit;Sit to Supine     Supine to sit: Modified independent (Device/Increase time);HOB elevated Sit to supine: Modified independent (Device/Increase time);HOB elevated      Transfers Overall transfer level: Needs assistance Equipment used: Rolling walker (2 wheeled) Transfers: Sit to/from Stand Sit to Stand: Min guard         General transfer comment: verbal cues for hand placement  Ambulation/Gait Ambulation/Gait assistance: Min guard Gait Distance (Feet): 150 Feet Assistive device: Rolling walker (2  wheeled) Gait Pattern/deviations: Step-through pattern;Decreased stride length;Antalgic;Trunk flexed Gait velocity: decr   General Gait Details: pt tends to ambulate with forearms on RW and trunk flexion; HR 145 bpm, required 3-4 standing rest breaks, VCs for posture   Stairs             Wheelchair Mobility    Modified Rankin (Stroke Patients Only)       Balance Overall balance assessment: Needs assistance Sitting-balance support: No upper extremity supported;Feet supported Sitting balance-Leahy Scale: Good     Standing balance support: Single extremity supported;During functional activity Standing balance-Leahy Scale: Fair Standing balance comment: standing for back peri care                            Cognition Arousal/Alertness: Awake/alert Behavior During Therapy: WFL for tasks assessed/performed Overall Cognitive Status: Within Functional Limits for tasks assessed                                        Exercises      General Comments        Pertinent Vitals/Pain Pain Assessment: No/denies pain    Home Living                      Prior Function            PT Goals (current goals can now be found in the care plan section) Acute Rehab PT Goals Patient Stated Goal: agreeable to get OOB, likes martial arts PT Goal Formulation: With patient Time For Goal Achievement: 08/06/19 Potential to  Achieve Goals: Fair Progress towards PT goals: Progressing toward goals    Frequency    Min 3X/week      PT Plan Current plan remains appropriate    Co-evaluation              AM-PAC PT "6 Clicks" Mobility   Outcome Measure  Help needed turning from your back to your side while in a flat bed without using bedrails?: A Little Help needed moving from lying on your back to sitting on the side of a flat bed without using bedrails?: A Little Help needed moving to and from a bed to a chair (including a wheelchair)?: A  Little Help needed standing up from a chair using your arms (e.g., wheelchair or bedside chair)?: A Little Help needed to walk in hospital room?: A Little Help needed climbing 3-5 steps with a railing? : A Lot 6 Click Score: 17    End of Session Equipment Utilized During Treatment: Gait belt Activity Tolerance: Patient tolerated treatment well Patient left: in bed;with call bell/phone within reach;with bed alarm set Nurse Communication: Mobility status PT Visit Diagnosis: Difficulty in walking, not elsewhere classified (R26.2);Muscle weakness (generalized) (M62.81)     Time: [id number] PT Time Calculation (min) (ACUTE ONLY): 20 min  Charges:  $Gait Training: 8-22 mins                     Blondell Reveal Kistler PT 08/19/2019  Acute Rehabilitation Services Pager [phone] Office [phone]

## 2019-08-19 NOTE — Progress Notes (Signed)
Hypoglycemic Event  CBG: 65  Treatment: 4 oz juice/soda  Symptoms: None  Follow-up CBG: [id number] CBG Result:67  Possible Reasons for Event: Inadequate meal intake  Comments/MD notified: MD Patel aware.    CBG: 67  Treatment: D50 74mL (12.5gm)  Symptoms: None  Follow-up CBG: Time: 1022 CBG Result: 98  Possible Reasons for Event: Inadequate meal intake  Comments/MD notified: MD Patel aware.   [place]

## 2019-08-19 NOTE — Progress Notes (Addendum)
[facility] PROGRESS NOTE  [facility]. [id number] DOB: 1946/09/17 DOA: 07/20/2019 PCP: [name], MD  Brief summary   73 year old with past medical history significant for cardiovascular disease, chronic systolic heart failure ejection fraction 25%, status post AICD, chronic A. 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 Patient presented to the hospital on 7/27 with lightheadedness and dizziness after having tooth removed, bleeding from the mouth and unable to eat properly. Per the patient he has not been on any blood thinner since he had retroperitoneal bleed about 2 months prior to admission. Patient is able to walk with a walker. Also has a history of seizure and is on multiple medications.  Patient had gone to the dentist and had almost all his teeth removed on 07/08/2019, following that he had continued bleeding into his mouth and he had talk to their dentist who recommended to control with gauze piece. Patient has had poor oral intake.   Since hospitalization, patient has required occasional transfusion as well as FFP. Also due to poor oral intake, has episode of hypoglycemia requiring D5 drip. The morning 8/11 patient had another episode of oral bleeding through several pads, although hemoglobin has remained stable. Patient's second COVID test was also negative.  Assessment/Plan:  Anemia due to acute blood loss, symptomatic anemia:  - post operative dental bleeding.  - Patient has required blood transfusion during this admission due to dropping hemoglobin. - He has also required FFP and vitamin K. - Dr. [name] oral surgeon did suture of incision on 8/2; multiple interventions since;  - He has a history of factor VII deficiency/ ?, hematology was consulted. Work-up has been sent out. 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 - Patient also received Amicar; not compliant on it, needs to use it - S/P 1 unit PRBC 8-14;  - went to OR for hemostasis8/20; bleeding improved;appreciate oral surgery assistance - Hg remains relatively stable/ Further management as per hematology team.  Hypoglycemia: - Secondary to poor oral intake vs liver related . - act stim test with good response. TSH mildly elevated.  - Appreciate Dr Buddy Duty help. Will need to check protein C and insulin level if blood sugar venous sample less than 55. So far patient has been above 60.  - Encourage oral intake.    Hypotension - post surgery likely related to precedex. - Patient became agitated after surgery in PACU. Was transiently on precex gtt. Subsequently became hypotensive SBP in the 70. Anesthesiology ordered IV bolus. SBP in the high 80.  - Patient also with spike after QRS, unclear if defibrillator is censing correctly. Cardiology has been consulted to evaluate rhythm.  - BP improved.   Possible Ventricular Undersensing;  - Appreciate Cardiology evaluation.  - Defibrillator was interrogated, defibrillator is working normally.  Constipation:  - resolved. Schedule Senokot. - Dulcolax PRN  Mild hyperkalemia: - Resolved after 2 doses of lokelma.   Dizziness lightheadedness:  - Likely related to blood loss. - Improved.   Chronic congestive heart failure status post AICD. - Due to soft blood pressure, Aldactone, Coreg, nitrates and hydralazine has been on hold. - Cardiology follow up.  - low-dose carvedilol.  Chronic A. fib: -Continue with amiodarone.  Will provide another loading dose of 200 mg twice daily for at least 1 to 2 weeks. - PRN labetalol.  -  low-dose carvedilol. Not a candidate for anticoagulation due to bleeding risk    History of seizure disorder: - Continue with Depakote, Keppra , phenytoin.  Dementia without behavioral disturbance:  - Stable  Code Status: full Family Communication: d/w patient, RN and wife on the phone. Disposition Plan: Possible discharge tomorrow morning on 08/20/2019 pending glucose levels.   Consultants:  Oral Surgery  Hematology  Cardiology  Endocrine  Procedures:  As above   Antibiotics: Anti-infectives (From admission, onward)   Start     Dose/Rate Route Frequency Ordered Stop   08/13/19 0600  ceFAZolin (ANCEF) IVPB 2g/100 mL premix     2 g 200 mL/hr over 30 Minutes Intravenous On call to O.R. 08/12/19 1359 08/12/19 1544   08/12/19 1509  ceFAZolin (ANCEF) 2-4 GM/100ML-% IVPB    Note to Pharmacy: Marla Roe   : cabinet override      08/12/19 1509 08/12/19 1544   08/08/19 0600  ceFAZolin (ANCEF) IVPB 2g/100 mL premix     2 g 200 mL/hr over 30 Minutes Intravenous On call to O.R. 08/07/19 1724 08/08/19 0959   07/26/19 1602  ceFAZolin (ANCEF) 2-4 GM/100ML-% IVPB    Note to Pharmacy: Darlys Gales   : cabinet override      07/26/19 1602 07/26/19 1626   07/26/19 0845  ceFAZolin (ANCEF) IVPB 2g/100 mL premix     2 g 200 mL/hr over 30 Minutes Intravenous On call to O.R. 07/26/19 [id number] 07/26/19 1631   07/21/19 0845  amoxicillin (AMOXIL) capsule 500 mg     500 mg Oral Every 8 hours 07/21/19 0837 07/24/19 2203   07/20/19 2200  amoxicillin (AMOXIL) capsule 500 mg  Status:  Discontinued     500 mg Oral 2 times daily 07/20/19 1801 07/21/19 0837       (indicate start date, and stop date if known)  HPI/Subjective: Denies any nausea or vomiting denies any constipation.  Was not happy with his breakfast this morning and therefore he did not eat any of his breakfast. He is willing to take Ensure 4 times a day.   Objective: Vitals:   08/19/19 1052 08/19/19 1420  BP: 105/87 102/83  Pulse: (!) 119 (!) 118  Resp:  20  Temp:  98 F (36.7 C)  SpO2:   100%    Intake/Output Summary (Last 24 hours) at 08/19/2019 1745 Last data filed at 08/19/2019 1300 Gross per 24 hour  Intake 540 ml  Output 1500 ml  Net -960 ml   Filed Weights   08/15/19 0500 08/18/19 0500 08/19/19 0500  Weight: 98.5 kg 102.5 kg 100.2 kg    Exam:   General:  No acute distress   Cardiovascular: s1,s2 rrr  Respiratory: CTA BL  Abdomen:  Soft, nt   Musculoskeletal: mild ankle edema    Data Reviewed: Basic Metabolic Panel: Recent Labs  Lab 08/13/19 0439 08/14/19 0456 08/15/19 0919 08/16/19 0352  NA 136 139 139 139  K 4.9 4.7 4.5 4.9  CL 111 113* 113* 115*  CO2 22 23 22 22   GLUCOSE 93 113* 80 74  BUN 11 12 12 11   CREATININE 1.03 1.05 0.92 0.95  CALCIUM 7.7* 7.9* 8.0* 7.8*  MG 2.0 1.8  --  1.8  PHOS 4.1 3.5 3.9 3.4   Liver Function Tests: Recent Labs  Lab 08/13/19 0439 08/14/19 0456 08/15/19 0919 08/16/19 0352  ALBUMIN 2.7* 2.4* 2.4* 2.4*   No results for input(s): LIPASE, AMYLASE in the last 168 hours. No results for input(s): AMMONIA in  the last 168 hours. CBC: Recent Labs  Lab 08/13/19 0439 08/14/19 0456 08/15/19 0919 08/16/19 0352 08/19/19 0443  WBC 6.1 4.8 4.2 4.6 4.7  NEUTROABS 4.2 2.7 2.3 2.3  --   HGB 8.2* 7.7* 7.9* 8.2* 8.9*  HCT 27.5* 25.6* 26.6* 27.1* 28.7*  MCV 97.9 98.1 97.1 97.1 98.3  PLT 220 185 204 216 223   Cardiac Enzymes: No results for input(s): CKTOTAL, CKMB, CKMBINDEX, TROPONINI in the last 168 hours. BNP (last 3 results) Recent Labs    12/06/18 1917 07/21/19 1129  BNP 36.4 40.2    ProBNP (last 3 results) Recent Labs    11/26/18 1025  PROBNP 56.0    CBG: Recent Labs  Lab 08/19/19 0751 08/19/19 0934 08/19/19 1022 08/19/19 1218 08/19/19 1707  GLUCAP 65* 67* 98 84 87    Recent Results (from the past 240 hour(s))  Novel Coronavirus, NAA (hospital order; send-out to ref lab)     Status: Abnormal   Collection Time: 08/11/19 11:17 AM   Specimen: Nasopharyngeal Swab; Respiratory  Result  Value Ref Range Status   SARS-CoV-2, NAA DETECTED (A) NOT DETECTED Final    Comment: (NOTE)                  Client Requested Flag This test was developed and its performance characteristics determined by [employer]. This test has not been FDA cleared or approved. This test has been authorized by FDA under an Emergency Use Authorization (EUA). This test is only authorized for the duration of time the declaration that circumstances exist justifying the authorization of the emergency use of in vitro diagnostic tests for detection of SARS-CoV-2 virus and/or diagnosis of COVID-19 infection under section 564(b)(1) of the Act, 21 U.S.C. [id number]), unless the authorization is terminated or revoked sooner. When diagnostic testing is negative, the possibility of a false negative result should be considered in the context of a patient's recent exposures and the presence of clinical signs and symptoms consistent with COVID-19. An individual without symptoms of COVID-19 and who is not shedding SARS-CoV-2 virus would expect to have a negative (not det ected) result in this assay. Performed At: [facility] [place], [place] [id number] Rush Farmer MD [id number]    [place]  Final    Comment: Performed at [facility] [address] [place], [place] [address]  SARS Coronavirus 2 [facility] order, Performed in [facility] hospital lab) Nasopharyngeal Nasopharyngeal Swab     Status: None   Collection Time: 08/13/19  3:42 PM   Specimen: Nasopharyngeal Swab  Result Value Ref Range Status   SARS Coronavirus 2 NEGATIVE NEGATIVE Final    Comment: (NOTE) If result is NEGATIVE SARS-CoV-2 target nucleic acids are NOT DETECTED. The SARS-CoV-2 RNA is generally detectable in upper and lower  respiratory specimens during the acute phase of infection. The lowest  concentration of SARS-CoV-2 viral copies this assay can  detect is 250  copies / mL. A negative result does not preclude SARS-CoV-2 infection  and should not be used as the sole basis for treatment or other  patient management decisions.  A negative result may occur with  improper specimen collection / handling, submission of specimen other  than nasopharyngeal swab, presence of viral mutation(s) within the  areas targeted by this assay, and inadequate number of viral copies  (<250 copies / mL). A negative result must be combined with clinical  observations, patient history, and epidemiological information. If result is POSITIVE SARS-CoV-2 target  nucleic acids are DETECTED. The SARS-CoV-2 RNA is generally detectable in upper and lower  respiratory specimens dur ing the acute phase of infection.  Positive  results are indicative of active infection with SARS-CoV-2.  Clinical  correlation with patient history and other diagnostic information is  necessary to determine patient infection status.  Positive results do  not rule out bacterial infection or co-infection with other viruses. If result is PRESUMPTIVE POSTIVE SARS-CoV-2 nucleic acids MAY BE PRESENT.   A presumptive positive result was obtained on the submitted specimen  and confirmed on repeat testing.  While 2019 novel coronavirus  (SARS-CoV-2) nucleic acids may be present in the submitted sample  additional confirmatory testing may be necessary for epidemiological  and / or clinical management purposes  to differentiate between  SARS-CoV-2 and other Sarbecovirus currently known to infect humans.  If clinically indicated additional testing with an alternate test  methodology [id number]) is advised. The SARS-CoV-2 RNA is generally  detectable in upper and lower respiratory sp ecimens during the acute  phase of infection. The expected result is Negative. Fact Sheet for Patients:  StrictlyIdeas.no Fact Sheet for Healthcare  Providers: BankingDealers.co.za This test is not yet approved or cleared by the [place] FDA and has been authorized for detection and/or diagnosis of SARS-CoV-2 by FDA under an Emergency Use Authorization (EUA).  This EUA will remain in effect (meaning this test can be used) for the duration of the COVID-19 declaration under Section 564(b)(1) of the Act, 21 U.S.C. section 360bbb-3(b)(1), unless the authorization is terminated or revoked sooner. Performed at [facility], [place] [address] [place], [place] [address]      Studies: Vas [place] Upper Extremity Venous Duplex  Result Date: 08/19/2019 UPPER VENOUS STUDY  Indications: Edema Risk Factors: None identified. Limitations: Line, bandages and poor ultrasound/tissue interface. Comparison Study: No prior studies. Performing Technologist: Oliver Hum RVT  Examination Guidelines: A complete evaluation includes B-mode imaging, spectral Doppler, color Doppler, and power Doppler as needed of all accessible portions of each vessel. Bilateral testing is considered an integral part of a complete examination. Limited examinations for reoccurring indications may be performed as noted.  Right Findings: +----------+------------+---------+-----------+----------+-------+ RIGHT     CompressiblePhasicitySpontaneousPropertiesSummary +----------+------------+---------+-----------+----------+-------+ Subclavian    Full       Yes       Yes                      +----------+------------+---------+-----------+----------+-------+  Left Findings: +----------+------------+---------+-----------+----------+-------+ LEFT      CompressiblePhasicitySpontaneousPropertiesSummary +----------+------------+---------+-----------+----------+-------+ IJV           Full       Yes       Yes                      +----------+------------+---------+-----------+----------+-------+ Subclavian    Full       Yes       Yes                       +----------+------------+---------+-----------+----------+-------+ Axillary      Full       Yes       Yes                      +----------+------------+---------+-----------+----------+-------+ Brachial      Full       Yes       Yes                      +----------+------------+---------+-----------+----------+-------+  Radial        Full                                          +----------+------------+---------+-----------+----------+-------+ Ulnar         Full                                          +----------+------------+---------+-----------+----------+-------+ Cephalic      Full                                          +----------+------------+---------+-----------+----------+-------+ Basilic       Full                                          +----------+------------+---------+-----------+----------+-------+  Summary:  Right: No evidence of thrombosis in the subclavian.  Left: No evidence of deep vein thrombosis in the upper extremity. No evidence of superficial vein thrombosis in the upper extremity.  *See table(s) above for measurements and observations.    Preliminary     Scheduled Meds: . amiodarone  200 mg Oral Daily  . carvedilol  3.125 mg Oral BID WC  . divalproex  500 mg Oral Daily  . feeding supplement (ENSURE ENLIVE)  237 mL Oral QID  . ferrous sulfate  325 mg Oral BID WC  . levETIRAcetam  1,000 mg Oral BID  . phenytoin  100 mg Oral BID  . senna-docusate  1 tablet Oral BID  . sodium chloride flush  10-40 mL Intracatheter Q12H  . vitamin B-12  100 mcg Oral Daily   Continuous Infusions:   Principal Problem:   Anemia due to blood loss, acute Active Problems:   HTN (hypertension)   Coronary artery disease involving native heart   Automatic implantable cardioverter-defibrillator in situ   Seizure (HCC)   Renal insufficiency   Factor VII deficiency (HCC)   Chronic systolic CHF (congestive heart failure), NYHA  class 2 (HCC)   Chronic kidney disease (CKD), stage III (moderate) (HCC)   Oral bleeding   Coagulopathy ([place])   Hospice care patient   Palliative care patient   Complications due to automatic implantable cardioverter-defibrillator  Time spent: >25 minutes   [place] [facility] Pager If 7PM-7AM, please contact night-coverage at [email or web address], password [facility] 08/19/2019, 5:45 PM  LOS: 29 days

## 2019-08-20 LAB — BASIC METABOLIC PANEL
Anion gap: 5 (ref 5–15)
BUN: 18 mg/dL (ref 8–23)
CO2: 22 mmol/L (ref 22–32)
Calcium: 7.8 mg/dL — ABNORMAL LOW (ref 8.9–10.3)
Chloride: 109 mmol/L (ref 98–111)
Creatinine, Ser: 1.06 mg/dL (ref 0.61–1.24)
GFR calc Af Amer: >60 mL/min (ref >60)
GFR calc non Af Amer: >60 mL/min (ref >60)
Glucose, Bld: 86 mg/dL (ref 70–99)
Potassium: 5.2 mmol/L — ABNORMAL HIGH (ref 3.5–5.1)
Sodium: 136 mmol/L (ref 135–145)

## 2019-08-20 LAB — GLUCOSE, CAPILLARY
Glucose-Capillary: 102 mg/dL — ABNORMAL HIGH (ref 70–99)
Glucose-Capillary: 104 mg/dL — ABNORMAL HIGH (ref 70–99)
Glucose-Capillary: 106 mg/dL — ABNORMAL HIGH (ref 70–99)
Glucose-Capillary: 127 mg/dL — ABNORMAL HIGH (ref 70–99)
Glucose-Capillary: 68 mg/dL — ABNORMAL LOW (ref 70–99)
Glucose-Capillary: 70 mg/dL (ref 70–99)
Glucose-Capillary: 76 mg/dL (ref 70–99)

## 2019-08-20 LAB — CBC
HCT: 29.1 % — ABNORMAL LOW (ref 39.0–52.0)
Hemoglobin: 8.8 g/dL — ABNORMAL LOW (ref 13.0–17.0)
MCH: 29.9 pg (ref 26.0–34.0)
MCHC: 30.2 g/dL (ref 30.0–36.0)
MCV: 99 fL (ref 80.0–100.0)
Platelets: 216 10*3/uL (ref 150–400)
RBC: 2.94 MIL/uL — ABNORMAL LOW (ref 4.22–5.81)
RDW: 18.8 % — ABNORMAL HIGH (ref 11.5–15.5)
WBC: 4.7 10*3/uL (ref 4.0–10.5)
nRBC: 1.5 % — ABNORMAL HIGH (ref 0.0–0.2)

## 2019-08-20 LAB — MAGNESIUM: Magnesium: 2 mg/dL (ref 1.7–2.4)

## 2019-08-20 MED ORDER — ENSURE ENLIVE PO LIQD
237.0000 mL | Freq: Three times a day (TID) | ORAL | Status: DC
  Administered 2019-08-20 – 2019-08-21 (×3): 237 mL via ORAL

## 2019-08-20 NOTE — TOC Progression Note (Signed)
Transition of Care (TOC) - Progression Note    Patient Details  Name: [facility]. MRN: [id number] Date of Birth: January 19, 1946  Transition of Care [facility]) CM/SW [place], LCSW Phone Number: 08/20/2019, 10:48 AM  Clinical Narrative:   Spouse still wants patient to discharge to SNF because she is unable to get [facility] services in the home. Spouse Mardene Celeste would prefer [facility] since its close to there home. CSW contacted admission coordinator Juliann Pulse  for [facility] and she stated they will be able to take patient. Juliann Pulse stated she will start authorization through patients insurance. Awaiting authorization and covid test          Expected Discharge Plan and Services           Expected Discharge Date: (unknown)                                     Social Determinants of Health (SDOH) Interventions    Readmission Risk Interventions Readmission Risk Prevention Plan 08/07/2019  Transportation Screening Complete  PCP or Specialist Appt within 3-5 Days Complete  HRI or [place] Not Complete  HRI or Home Care Consult comments To SNF  Social Work Consult for [place] Planning/Counseling Not Complete  SW consult not completed comments NA  Palliative Care Screening Not Applicable  Medication Review Press photographer) Complete  Some recent data might be hidden

## 2019-08-20 NOTE — Progress Notes (Signed)
Occupational Therapy Treatment Patient Details Name: [facility]. MRN: [id number] DOB: [birth year] Today's Date: 08/20/2019    History of present illness [facility]. is a 73 y.o. 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  Pt s/p resuturing of oral wounds on 08/08/19.  Pt also with Biotronik ICD with Possible Ventricular Undersensing and cardiology following.   OT comments  Pt self limiting this OT session.  Declined OOB despite much encouragement.  Follow Up Recommendations  SNF;Supervision/Assistance - 24 hour    Equipment Recommendations  None recommended by OT    Recommendations for Other Services      Precautions / Restrictions Precautions Precautions: Fall Precaution Comments: monitor HR Restrictions Weight Bearing Restrictions: No       Mobility Bed Mobility Overal bed mobility: Modified Independent Bed Mobility: Supine to Sit;Sit to Supine     Supine to sit: Modified independent (Device/Increase time);HOB elevated Sit to supine: Modified independent (Device/Increase time);HOB elevated      Transfers Overall transfer level: Needs assistance               General transfer comment: delined transfer    Balance Overall balance assessment: Needs assistance Sitting-balance support: No upper extremity supported;Feet supported Sitting balance-Leahy Scale: Good         Standing balance comment: standing for back peri care                           ADL either performed or assessed with clinical judgement   ADL Overall ADL's : Needs  assistance/impaired     Grooming: Set up;Supervision/safety;Sitting                       Toileting- Clothing Manipulation and Hygiene: Minimal assistance Toileting - Clothing Manipulation Details (indicate cue type and reason): using urinal sitting EOB       General ADL Comments: pt needed MAX encouragement to work with OT. Pt attempting to use urinal inbed.  Encouraged pt to sit EOB in which pt did have sucess.  Pt declined to stand or get to chair     Vision Patient Visual Report: No change from baseline     Perception     Praxis      Cognition Arousal/Alertness: Awake/alert Behavior During Therapy: WFL for tasks assessed/performed Overall Cognitive Status: Within Functional Limits for tasks assessed                                                     Pertinent Vitals/ Pain       Pain Assessment: No/denies pain         Frequency  Min 2X/week        Progress Toward Goals  OT Goals(current goals can now be found in the care plan section)  Progress towards OT goals: OT to reassess next treatment     Plan Discharge plan remains appropriate       AM-PAC OT "6 Clicks" Daily Activity  Outcome Measure   Help from another person eating meals?: None Help from another person taking care of personal grooming?: A Little Help from another person toileting, which includes using toliet, bedpan, or urinal?: A Little Help from another person bathing (including washing, rinsing, drying)?: A Little Help from another person to put on and taking off regular upper body clothing?: A Little Help from another person to put on and taking off regular lower body clothing?: A Little 6 Click Score: 19    End of Session    OT Visit Diagnosis: Unsteadiness on feet (R26.81);Muscle weakness (generalized) (M62.81);History of falling (Z91.81)   Activity Tolerance Other (comment)(pt self limited session)   Patient Left in bed;with call bell/phone  within reach;with bed alarm set   Nurse Communication Mobility status        Time: [id number] OT Time Calculation (min): 10 min  Charges: OT General Charges $OT Visit: 1 Visit OT Treatments $Self Care/Home Management : 8-22 mins  Kari Baars, [place] [phone] Office- [phone]      Korianna Washer, Edwena Felty D 08/20/2019, 2:12 PM

## 2019-08-20 NOTE — Progress Notes (Signed)
[facility] PROGRESS NOTE  [facility]. [id number] DOB: [birth year] DOA: 07/20/2019 PCP: [name], MD  Brief summary   73 year old with past medical history significant for cardiovascular disease, chronic systolic heart failure ejection fraction 25%, status post AICD, chronic A. 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 Patient presented to the hospital on 7/27 with lightheadedness and dizziness after having tooth removed, bleeding from the mouth and unable to eat properly. Per the patient he has not been on any blood thinner since he had retroperitoneal bleed about 2 months prior to admission. Patient is able to walk with a walker. Also has a history of seizure and is on multiple medications.  Patient had gone to the dentist and had almost all his teeth removed on 07/08/2019, following that he had continued bleeding into his mouth and he had talk to their dentist who recommended to control with gauze piece. Patient has had poor oral intake.   Since hospitalization, patient has required occasional transfusion as well as FFP. Also due to poor oral intake, has episode of hypoglycemia requiring D5 drip. The morning 8/11 patient had another episode of oral bleeding through several pads, although hemoglobin has remained stable. Patient's second COVID test was also negative.  Assessment/Plan:  Anemia due to acute blood loss, symptomatic anemia:  - post operative dental bleeding.  - Patient has required blood transfusion during this admission due to dropping hemoglobin. - He has also required FFP and vitamin K. - Dr. [name] oral surgeon did suture of incision on 8/2; multiple interventions since;  - He has a history of factor VII deficiency/ ?, hematology was consulted. Work-up has been sent out. 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 - Patient also received Amicar; not compliant on it, needs to use it - S/P 1 unit PRBC 8-14;  - went to OR for hemostasis8/20; bleeding improved;appreciate oral surgery assistance - Hg remains relatively stable/ Further management as per hematology team.  Hypoglycemia: - Secondary to poor oral intake vs liver related . - act stim test with good response. TSH mildly elevated.  - Appreciate Dr Buddy Duty help. Will need to check protein C and insulin level if blood sugar venous sample less than 55. So far patient has been above 60.  - Encourage oral intake.    Hypotension - post surgery likely related to precedex. - Patient became agitated after surgery in PACU. Was transiently on precex gtt. Subsequently became hypotensive SBP in the 70. Anesthesiology ordered IV bolus. SBP in the high 80.  - Patient also with spike after QRS, unclear if defibrillator is censing correctly. Cardiology has been consulted to evaluate rhythm.  - BP improved.   Possible Ventricular Undersensing;  - Appreciate Cardiology evaluation.  - Defibrillator was interrogated, defibrillator is working normally.  Constipation:  - resolved. Schedule Senokot. - Dulcolax PRN  Mild hyperkalemia: - Resolved after 2 doses of lokelma.   Dizziness lightheadedness:  - Likely related to blood loss. - Improved.   Chronic congestive heart failure status post AICD. - Due to soft blood pressure, Aldactone, Coreg, nitrates and hydralazine has been on hold. - Cardiology follow up.  - low-dose carvedilol.  Chronic A. fib: -Continue with amiodarone.  Will provide another loading dose of 200 mg twice daily for at least 1 to 2 weeks. - PRN labetalol.  -  low-dose carvedilol. Not a candidate for anticoagulation due to bleeding risk    History of seizure disorder: - Continue with Depakote, Keppra , phenytoin.  Dementia without behavioral disturbance:  - Stable  Code Status: full Family Communication: d/w patient, RN and wife on the phone. Disposition Plan: Possible discharge tomorrow morning on 08/21/2019 pending COVID test and glucose levels.   Consultants:  Oral Surgery  Hematology  Cardiology  Endocrine  Procedures:  As above   Antibiotics: Anti-infectives (From admission, onward)   Start     Dose/Rate Route Frequency Ordered Stop   08/13/19 0600  ceFAZolin (ANCEF) IVPB 2g/100 mL premix     2 g 200 mL/hr over 30 Minutes Intravenous On call to O.R. 08/12/19 1359 08/12/19 1544   08/12/19 1509  ceFAZolin (ANCEF) 2-4 GM/100ML-% IVPB    Note to Pharmacy: Marla Roe   : cabinet override      08/12/19 1509 08/12/19 1544   08/08/19 0600  ceFAZolin (ANCEF) IVPB 2g/100 mL premix     2 g 200 mL/hr over 30 Minutes Intravenous On call to O.R. 08/07/19 1724 08/08/19 0959   07/26/19 1602  ceFAZolin (ANCEF) 2-4 GM/100ML-% IVPB    Note to Pharmacy: Darlys Gales   : cabinet override      07/26/19 1602 07/26/19 1626   07/26/19 0845  ceFAZolin (ANCEF) IVPB 2g/100 mL premix     2 g 200 mL/hr over 30 Minutes Intravenous On call to O.R. 07/26/19 [id number] 07/26/19 1631   07/21/19 0845  amoxicillin (AMOXIL) capsule 500 mg     500 mg Oral Every 8 hours 07/21/19 0837 07/24/19 2203   07/20/19 2200  amoxicillin (AMOXIL) capsule 500 mg  Status:  Discontinued     500 mg Oral 2 times daily 07/20/19 1801 07/21/19 0837       (indicate start date, and stop date if known)  HPI/Subjective: No acute complaint.  No nausea no vomiting.   Objective: Vitals:   08/20/19 1338 08/20/19 1600  BP: 100/75   Pulse: (!) 113 98  Resp: 16   Temp: 97.6 F (36.4 C)   SpO2: 99%     Intake/Output Summary (Last 24 hours) at 08/20/2019 2018 Last data filed at 08/20/2019 1810 Gross per 24 hour  Intake 240 ml  Output  450 ml  Net -210 ml   Filed Weights   08/18/19 0500 08/19/19 0500 08/20/19 0500  Weight: 102.5 kg 100.2 kg 100.4 kg    Exam:   General:  No acute distress   Cardiovascular: s1,s2 rrr  Respiratory: CTA BL  Abdomen:  Soft, nt   Musculoskeletal: mild ankle edema    Data Reviewed: Basic Metabolic Panel: Recent Labs  Lab 08/14/19 0456 08/15/19 0919 08/16/19 0352 08/20/19 0351  NA 139 139 139 136  K 4.7 4.5 4.9 5.2*  CL 113* 113* 115* 109  CO2 23 22 22 22   GLUCOSE 113* 80 74 86  BUN 12 12 11 18   CREATININE 1.05 0.92 0.95 1.06  CALCIUM 7.9* 8.0* 7.8* 7.8*  MG 1.8  --  1.8 2.0  PHOS 3.5 3.9 3.4  --    Liver Function Tests: Recent Labs  Lab 08/14/19 0456 08/15/19 0919 08/16/19 0352  ALBUMIN 2.4* 2.4* 2.4*   No results for input(s): LIPASE, AMYLASE in the last 168 hours. No results for input(s): AMMONIA in the last 168 hours. CBC: Recent Labs  Lab 08/14/19 0456 08/15/19 0919 08/16/19 0352 08/19/19 0443 08/20/19 0351  WBC 4.8 4.2 4.6 4.7 4.7  NEUTROABS  2.7 2.3 2.3  --   --   HGB 7.7* 7.9* 8.2* 8.9* 8.8*  HCT 25.6* 26.6* 27.1* 28.7* 29.1*  MCV 98.1 97.1 97.1 98.3 99.0  PLT 185 204 216 223 216   Cardiac Enzymes: No results for input(s): CKTOTAL, CKMB, CKMBINDEX, TROPONINI in the last 168 hours. BNP (last 3 results) Recent Labs    12/06/18 1917 07/21/19 1129  BNP 36.4 40.2    ProBNP (last 3 results) Recent Labs    11/26/18 1025  PROBNP 56.0    CBG: Recent Labs  Lab 08/19/19 2144 08/20/19 0002 08/20/19 0819 08/20/19 1637 08/20/19 1816  GLUCAP 78 104* 70 68* 106*    Recent Results (from the past 240 hour(s))  Novel Coronavirus, NAA (hospital order; send-out to ref lab)     Status: Abnormal   Collection Time: 08/11/19 11:17 AM   Specimen: Nasopharyngeal Swab; Respiratory  Result Value Ref Range Status   SARS-CoV-2, NAA DETECTED (A) NOT DETECTED Final    Comment: (NOTE)                  Client Requested Flag This test was developed  and its performance characteristics determined by [employer]. This test has not been FDA cleared or approved. This test has been authorized by FDA under an Emergency Use Authorization (EUA). This test is only authorized for the duration of time the declaration that circumstances exist justifying the authorization of the emergency use of in vitro diagnostic tests for detection of SARS-CoV-2 virus and/or diagnosis of COVID-19 infection under section 564(b)(1) of the Act, 21 U.S.C. [id number]), unless the authorization is terminated or revoked sooner. When diagnostic testing is negative, the possibility of a false negative result should be considered in the context of a patient's recent exposures and the presence of clinical signs and symptoms consistent with COVID-19. An individual without symptoms of COVID-19 and who is not shedding SARS-CoV-2 virus would expect to have a negative (not det ected) result in this assay. Performed At: [facility] [place], [place] [id number] Rush Farmer MD [id number]    [place]  Final    Comment: Performed at [facility] [address], [place], [place] [address]  SARS Coronavirus 2 [facility] order, Performed in [facility] hospital lab) Nasopharyngeal Nasopharyngeal Swab     Status: None   Collection Time: 08/13/19  3:42 PM   Specimen: Nasopharyngeal Swab  Result Value Ref Range Status   SARS Coronavirus 2 NEGATIVE NEGATIVE Final    Comment: (NOTE) If result is NEGATIVE SARS-CoV-2 target nucleic acids are NOT DETECTED. The SARS-CoV-2 RNA is generally detectable in upper and lower  respiratory specimens during the acute phase of infection. The lowest  concentration of SARS-CoV-2 viral copies this assay can detect is 250  copies / mL. A negative result does not preclude SARS-CoV-2 infection  and should not be used as the sole basis for treatment or other  patient  management decisions.  A negative result may occur with  improper specimen collection / handling, submission of specimen other  than nasopharyngeal swab, presence of viral mutation(s) within the  areas targeted by this assay, and inadequate number of viral copies  (<250 copies / mL). A negative result must be combined with clinical  observations, patient history, and epidemiological information. If result is POSITIVE SARS-CoV-2 target nucleic acids are DETECTED. The SARS-CoV-2 RNA is generally detectable in upper and lower  respiratory specimens dur ing the acute phase of infection.  Positive  results are indicative of active infection with SARS-CoV-2.  Clinical  correlation with patient history and other diagnostic information is  necessary to determine patient infection status.  Positive results do  not rule out bacterial infection or co-infection with other viruses. If result is PRESUMPTIVE POSTIVE SARS-CoV-2 nucleic acids MAY BE PRESENT.   A presumptive positive result was obtained on the submitted specimen  and confirmed on repeat testing.  While 2019 novel coronavirus  (SARS-CoV-2) nucleic acids may be present in the submitted sample  additional confirmatory testing may be necessary for epidemiological  and / or clinical management purposes  to differentiate between  SARS-CoV-2 and other Sarbecovirus currently known to infect humans.  If clinically indicated additional testing with an alternate test  methodology [phone]) is advised. The SARS-CoV-2 RNA is generally  detectable in upper and lower respiratory sp ecimens during the acute  phase of infection. The expected result is Negative. Fact Sheet for Patients:  StrictlyIdeas.no Fact Sheet for Healthcare Providers: BankingDealers.co.za This test is not yet approved or cleared by the [place] FDA and has been authorized for detection and/or diagnosis of SARS-CoV-2 by FDA under  an Emergency Use Authorization (EUA).  This EUA will remain in effect (meaning this test can be used) for the duration of the COVID-19 declaration under Section 564(b)(1) of the Act, 21 U.S.C. section 360bbb-3(b)(1), unless the authorization is terminated or revoked sooner. Performed at [facility], [place] [address] [place], [place] [address]      Studies: Vas [place] Upper Extremity Venous Duplex  Result Date: 08/20/2019 UPPER VENOUS STUDY  Indications: Edema Risk Factors: None identified. Limitations: Line, bandages and poor ultrasound/tissue interface. Comparison Study: No prior studies. Performing Technologist: Oliver Hum RVT  Examination Guidelines: A complete evaluation includes B-mode imaging, spectral Doppler, color Doppler, and power Doppler as needed of all accessible portions of each vessel. Bilateral testing is considered an integral part of a complete examination. Limited examinations for reoccurring indications may be performed as noted.  Right Findings: +----------+------------+---------+-----------+----------+-------+ RIGHT     CompressiblePhasicitySpontaneousPropertiesSummary +----------+------------+---------+-----------+----------+-------+ Subclavian    Full       Yes       Yes                      +----------+------------+---------+-----------+----------+-------+  Left Findings: +----------+------------+---------+-----------+----------+-------+ LEFT      CompressiblePhasicitySpontaneousPropertiesSummary +----------+------------+---------+-----------+----------+-------+ IJV           Full       Yes       Yes                      +----------+------------+---------+-----------+----------+-------+ Subclavian    Full       Yes       Yes                      +----------+------------+---------+-----------+----------+-------+ Axillary      Full       Yes       Yes                       +----------+------------+---------+-----------+----------+-------+ Brachial      Full       Yes       Yes                      +----------+------------+---------+-----------+----------+-------+ Radial        Full                                          +----------+------------+---------+-----------+----------+-------+  Ulnar         Full                                          +----------+------------+---------+-----------+----------+-------+ Cephalic      Full                                          +----------+------------+---------+-----------+----------+-------+ Basilic       Full                                          +----------+------------+---------+-----------+----------+-------+  Summary:  Right: No evidence of thrombosis in the subclavian.  Left: No evidence of deep vein thrombosis in the upper extremity. No evidence of superficial vein thrombosis in the upper extremity.  *See table(s) above for measurements and observations.  Diagnosing physician: Harold Barban MD Electronically signed by Harold Barban MD on 08/20/2019 at 7:33:17 AM.    Final     Scheduled Meds: . amiodarone  200 mg Oral BID  . carvedilol  3.125 mg Oral BID WC  . divalproex  500 mg Oral Daily  . feeding supplement (ENSURE ENLIVE)  237 mL Oral TID BM  . ferrous sulfate  325 mg Oral BID WC  . levETIRAcetam  1,000 mg Oral BID  . phenytoin  100 mg Oral BID  . senna-docusate  1 tablet Oral BID  . sodium chloride flush  10-40 mL Intracatheter Q12H  . vitamin B-12  100 mcg Oral Daily   Continuous Infusions:   Principal Problem:   Anemia due to blood loss, acute Active Problems:   HTN (hypertension)   Coronary artery disease involving native heart   Automatic implantable cardioverter-defibrillator in situ   Seizure (HCC)   Renal insufficiency   Factor VII deficiency (HCC)   Chronic systolic CHF (congestive heart failure), NYHA class 2 (HCC)   Chronic kidney disease (CKD), stage  III (moderate) (HCC)   Oral bleeding   Coagulopathy ([place])   Hospice care patient   Palliative care patient   Complications due to automatic implantable cardioverter-defibrillator  Time spent: >25 minutes   [place] [place] [facility] Pager If 7PM-7AM, please contact night-coverage at [email or web address], password [facility] 08/20/2019, 8:18 PM  LOS: 30 days

## 2019-08-21 DIAGNOSIS — R2689 Other abnormalities of gait and mobility: Secondary | ICD-10-CM | POA: Diagnosis not present

## 2019-08-21 DIAGNOSIS — Z951 Presence of aortocoronary bypass graft: Secondary | ICD-10-CM | POA: Diagnosis not present

## 2019-08-21 DIAGNOSIS — I255 Ischemic cardiomyopathy: Secondary | ICD-10-CM | POA: Diagnosis not present

## 2019-08-21 DIAGNOSIS — Z006 Encounter for examination for normal comparison and control in clinical research program: Secondary | ICD-10-CM | POA: Diagnosis not present

## 2019-08-21 DIAGNOSIS — Z87891 Personal history of nicotine dependence: Secondary | ICD-10-CM | POA: Diagnosis not present

## 2019-08-21 DIAGNOSIS — W19XXXA Unspecified fall, initial encounter: Secondary | ICD-10-CM | POA: Diagnosis not present

## 2019-08-21 DIAGNOSIS — Z8614 Personal history of Methicillin resistant Staphylococcus aureus infection: Secondary | ICD-10-CM | POA: Diagnosis not present

## 2019-08-21 DIAGNOSIS — M6281 Muscle weakness (generalized): Secondary | ICD-10-CM | POA: Diagnosis not present

## 2019-08-21 DIAGNOSIS — I5022 Chronic systolic (congestive) heart failure: Secondary | ICD-10-CM | POA: Diagnosis not present

## 2019-08-21 DIAGNOSIS — I110 Hypertensive heart disease with heart failure: Secondary | ICD-10-CM | POA: Diagnosis not present

## 2019-08-21 DIAGNOSIS — I5020 Unspecified systolic (congestive) heart failure: Secondary | ICD-10-CM | POA: Diagnosis not present

## 2019-08-21 DIAGNOSIS — Z8673 Personal history of transient ischemic attack (TIA), and cerebral infarction without residual deficits: Secondary | ICD-10-CM | POA: Diagnosis not present

## 2019-08-21 DIAGNOSIS — G4700 Insomnia, unspecified: Secondary | ICD-10-CM | POA: Diagnosis not present

## 2019-08-21 DIAGNOSIS — I472 Ventricular tachycardia: Secondary | ICD-10-CM | POA: Diagnosis not present

## 2019-08-21 DIAGNOSIS — D500 Iron deficiency anemia secondary to blood loss (chronic): Secondary | ICD-10-CM | POA: Diagnosis not present

## 2019-08-21 DIAGNOSIS — Z4502 Encounter for adjustment and management of automatic implantable cardiac defibrillator: Secondary | ICD-10-CM | POA: Diagnosis not present

## 2019-08-21 DIAGNOSIS — R5381 Other malaise: Secondary | ICD-10-CM | POA: Diagnosis not present

## 2019-08-21 DIAGNOSIS — I480 Paroxysmal atrial fibrillation: Secondary | ICD-10-CM | POA: Diagnosis not present

## 2019-08-21 DIAGNOSIS — N390 Urinary tract infection, site not specified: Secondary | ICD-10-CM | POA: Diagnosis not present

## 2019-08-21 DIAGNOSIS — Z79899 Other long term (current) drug therapy: Secondary | ICD-10-CM | POA: Diagnosis not present

## 2019-08-21 DIAGNOSIS — I639 Cerebral infarction, unspecified: Secondary | ICD-10-CM | POA: Diagnosis not present

## 2019-08-21 DIAGNOSIS — I4891 Unspecified atrial fibrillation: Secondary | ICD-10-CM | POA: Diagnosis not present

## 2019-08-21 DIAGNOSIS — I482 Chronic atrial fibrillation, unspecified: Secondary | ICD-10-CM | POA: Diagnosis not present

## 2019-08-21 DIAGNOSIS — E669 Obesity, unspecified: Secondary | ICD-10-CM | POA: Diagnosis not present

## 2019-08-21 DIAGNOSIS — N183 Chronic kidney disease, stage 3 (moderate): Secondary | ICD-10-CM | POA: Diagnosis not present

## 2019-08-21 DIAGNOSIS — M1A9XX0 Chronic gout, unspecified, without tophus (tophi): Secondary | ICD-10-CM | POA: Diagnosis not present

## 2019-08-21 DIAGNOSIS — D682 Hereditary deficiency of other clotting factors: Secondary | ICD-10-CM | POA: Diagnosis not present

## 2019-08-21 DIAGNOSIS — I4819 Other persistent atrial fibrillation: Secondary | ICD-10-CM | POA: Diagnosis not present

## 2019-08-21 DIAGNOSIS — I2510 Atherosclerotic heart disease of native coronary artery without angina pectoris: Secondary | ICD-10-CM | POA: Diagnosis not present

## 2019-08-21 DIAGNOSIS — M2550 Pain in unspecified joint: Secondary | ICD-10-CM | POA: Diagnosis not present

## 2019-08-21 DIAGNOSIS — Z95810 Presence of automatic (implantable) cardiac defibrillator: Secondary | ICD-10-CM | POA: Diagnosis not present

## 2019-08-21 DIAGNOSIS — I10 Essential (primary) hypertension: Secondary | ICD-10-CM | POA: Diagnosis not present

## 2019-08-21 DIAGNOSIS — R262 Difficulty in walking, not elsewhere classified: Secondary | ICD-10-CM | POA: Diagnosis not present

## 2019-08-21 DIAGNOSIS — Z6830 Body mass index (BMI) 30.0-30.9, adult: Secondary | ICD-10-CM | POA: Diagnosis not present

## 2019-08-21 DIAGNOSIS — Z8249 Family history of ischemic heart disease and other diseases of the circulatory system: Secondary | ICD-10-CM | POA: Diagnosis not present

## 2019-08-21 DIAGNOSIS — I951 Orthostatic hypotension: Secondary | ICD-10-CM | POA: Diagnosis not present

## 2019-08-21 DIAGNOSIS — G40909 Epilepsy, unspecified, not intractable, without status epilepticus: Secondary | ICD-10-CM | POA: Diagnosis not present

## 2019-08-21 DIAGNOSIS — D62 Acute posthemorrhagic anemia: Secondary | ICD-10-CM | POA: Diagnosis not present

## 2019-08-21 DIAGNOSIS — R0600 Dyspnea, unspecified: Secondary | ICD-10-CM | POA: Diagnosis not present

## 2019-08-21 DIAGNOSIS — Z7401 Bed confinement status: Secondary | ICD-10-CM | POA: Diagnosis not present

## 2019-08-21 DIAGNOSIS — E785 Hyperlipidemia, unspecified: Secondary | ICD-10-CM | POA: Diagnosis not present

## 2019-08-21 DIAGNOSIS — M546 Pain in thoracic spine: Secondary | ICD-10-CM | POA: Diagnosis not present

## 2019-08-21 DIAGNOSIS — Z20828 Contact with and (suspected) exposure to other viral communicable diseases: Secondary | ICD-10-CM | POA: Diagnosis not present

## 2019-08-21 LAB — CBC
HCT: 29 % — ABNORMAL LOW (ref 39.0–52.0)
Hemoglobin: 8.7 g/dL — ABNORMAL LOW (ref 13.0–17.0)
MCH: 29.3 pg (ref 26.0–34.0)
MCHC: 30 g/dL (ref 30.0–36.0)
MCV: 97.6 fL (ref 80.0–100.0)
Platelets: 222 10*3/uL (ref 150–400)
RBC: 2.97 MIL/uL — ABNORMAL LOW (ref 4.22–5.81)
RDW: 18.7 % — ABNORMAL HIGH (ref 11.5–15.5)
WBC: 5 10*3/uL (ref 4.0–10.5)
nRBC: 1.2 % — ABNORMAL HIGH (ref 0.0–0.2)

## 2019-08-21 LAB — GLUCOSE, CAPILLARY
Glucose-Capillary: 87 mg/dL (ref 70–99)
Glucose-Capillary: 78 mg/dL (ref 70–99)
Glucose-Capillary: 100 mg/dL — ABNORMAL HIGH (ref 70–99)

## 2019-08-21 LAB — BASIC METABOLIC PANEL
Anion gap: 4 — ABNORMAL LOW (ref 5–15)
BUN: 19 mg/dL (ref 8–23)
CO2: 22 mmol/L (ref 22–32)
Calcium: 8 mg/dL — ABNORMAL LOW (ref 8.9–10.3)
Chloride: 110 mmol/L (ref 98–111)
Creatinine, Ser: 1.23 mg/dL (ref 0.61–1.24)
GFR calc Af Amer: >60 mL/min (ref >60)
GFR calc non Af Amer: 59 mL/min — ABNORMAL LOW (ref >60)
Glucose, Bld: 76 mg/dL (ref 70–99)
Potassium: 4.9 mmol/L (ref 3.5–5.1)
Sodium: 136 mmol/L (ref 135–145)

## 2019-08-21 LAB — SARS CORONAVIRUS 2 (TAT 6-24 HRS): SARS Coronavirus 2: NEGATIVE

## 2019-08-21 MED ORDER — HEPARIN SOD (PORK) LOCK FLUSH 100 UNIT/ML IV SOLN
250.0000 [IU] | INTRAVENOUS | Status: AC | PRN
  Administered 2019-08-21 (×2): 250 [IU]

## 2019-08-21 NOTE — Discharge Summary (Signed)
[facility] Discharge Summary   Patient: [facility]. Louis Ford:[id number]   PCP: [name], MD DOB: Aug 04, 1946   Date of admission: 07/20/2019   Date of discharge:  08/21/2019    Discharge Diagnoses:  Principal Problem:   Anemia due to blood loss, acute Active Problems:   HTN (hypertension)   Coronary artery disease involving native heart   Automatic implantable cardioverter-defibrillator in situ   Seizure (HCC)   Renal insufficiency   Factor VII deficiency (HCC)   Chronic systolic CHF (congestive heart failure), NYHA class 2 (HCC)   Chronic kidney disease (CKD), stage III (moderate) (HCC)   Oral bleeding   Coagulopathy [facility])   Hospice care patient   Palliative care patient   Complications due to automatic implantable cardioverter-defibrillator  Admitted From: home Disposition:  SNF   Recommendations for Outpatient Follow-up:  1. Please follow-up with PCP in 1 week with a repeat CBC. 2. Please follow-up with dentistry in 2 weeks. 3. Please follow-up with hematology in 1 month.   Contact information for follow-up providers    [name], MD Follow up in 1 week(s).   Specialty: Internal Medicine Contact information: [place] [address] [phone]        [name], [place]. Schedule an appointment as soon as possible for a visit in 2 week(s).   Specialty: Oral Surgery Contact information: [place] [address] [phone]        Brunetta Genera, MD. Schedule an appointment as soon as possible for a visit in 1 month(s).   Specialties: Hematology, Oncology Contact information: [place] [address] [phone]            Contact information for after-discharge care    Destination    [facility] CARE Preferred SNF .   Service: Skilled Nursing Contact information: [address] [place] [phone]                  Diet recommendation: Regular diet, soft  Activity: The patient is advised to gradually reintroduce usual activities,as tolerated .  Discharge Condition: good  Code Status: Full code  History of present illness: As per the H and P dictated on admission, "[facility]. is a 73 y.o. 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  Patient is poor historian.  Wife could not pick up the phone.  I reviewed his previous admissions.  According to the patient, he has not been on blood thinners since he had that retroperitoneal bleed about 2 months ago.  He had been otherwise doing well.  He walks with a walker.  Also has a history of seizure and is on multiple medications.  He went to dentist and had almost all of his teeth removed on 07/08/2019, following that he had continued bleeding into his mouth and they had talked to their dentist who recommended to control with a gauze piece.  His wife has been trying to feed him with Gatorade and chicken soup but otherwise he has not been able to eat much.  Patient states that 2 days ago he fell and hit his head had some pain but  improved now. The reason he came to the hospital today is feeling dizzy and blood pressure dropping on ambulation. ED Course: Patient is afebrile.  On room air.  Blood pressure are fairly stable on supine position.  Blood pressure drops more than 20 points on standing.  Hemoglobin 8.3 which has been fluctuating.  His baseline hemoglobin was 16.  He had a severe retroperitoneal bleed and hemoglobin had been about 9.  Creatinine 1.67 which is about his  baseline. With patient's orthostatic hypotension, multiple medical problems and symptomatic anemia admission was requested for monitoring in the hospital."  Hospital Course:  Summary of his active problems in the hospital is as following. Anemia due to acute blood loss, symptomatic anemia Post operative dental bleeding.  Patient has required blood transfusion during this admission due to dropping hemoglobin. He has also required FFP and vitamin K. Dr. [name] oral surgeon did suture of incision on 8/2; multiple interventions since;  He has a history of factor VII deficiency/ ?, hematology was consulted. Work-up has been sent out. Need vitamin K for 10 to 14 day(08/16/19) Per hematology is some concern for possible cirrhosis of liver and possible related coagulopathy is recommending vitamin K. Patient also received Amicar; not compliant on it, needs to use it S/P 1 unit PRBC 8-14;  Went to OR for hemostasis8/20; bleeding improved;appreciate oral surgery assistance Hg remains relatively stable/ Further management as per hematology team.  Hypoglycemia: Secondary to poor oral intake vs liver related . ACTH stim test with good response. TSH mildly elevated.  Appreciate Dr Buddy Duty help. Will need to check protein C and insulin level if blood sugar venous sample less than 55. So far patient has been above 60.  Encourage oral intake.  Hypotension post surgery likely related to precedex. Patient became agitated after surgery in PACU. Was transiently on precex gtt. Subsequently became hypotensive SBP in the 70. Anesthesiology ordered IV bolus. SBP in the high 80.  BP improved.   Possible Ventricular Undersensing;  [facility] evaluation.  Defibrillator was interrogated, defibrillator is working normally.  Constipation:  resolved. Schedule Senokot. Dulcolax PRN  Mild hyperkalemia: Resolved after 2 doses of lokelma.   Dizziness lightheadedness:  Likely related to blood  loss. Improved.   Chronic congestive heart failure status post AICD. Due to soft blood pressure, Aldactone, Coreg, nitrates and hydralazine has been on hold. Cardiology follow up.  low-dose carvedilol.  Chronic A. fib: Continue with amiodarone.   low-dose carvedilol. Not a candidate for anticoagulation due to bleeding risk   History of seizure disorder: Continue with Depakote, Keppra , phenytoin.  Dementia without behavioral disturbance:  Stable  Bilateral upper extremity edema. Venous Doppler negative for DVT.  Likely third spacing.  Limited venous access. A PICC line was inserted on 08/07/2019. We will discontinue on discharge.  Patient was seen by physical therapy, who recommended SNF, which was arranged by Education officer, museum. On the day of the discharge the patient's vitals were stable, and no other acute medical condition were reported by patient. the patient was felt safe to be discharge at SNF with SNF.  Consultants:   Oral Surgery  Hematology  Cardiology  Endocrine  Procedures:  ALVEOLOPLASTY WITH RESUTURING OF ORAL WOUND 07/26/2019 suture of oral wounds 850  DISCHARGE MEDICATION: Allergies as of 08/21/2019   No Known Allergies     Medication List    STOP taking these medications   acetaminophen-codeine 300-30 MG tablet Commonly known as: TYLENOL #3   amoxicillin 500 MG capsule Commonly known as:  AMOXIL   furosemide 40 MG tablet Commonly known as: LASIX   hydrALAZINE 25 MG tablet Commonly known as: APRESOLINE   isosorbide mononitrate 30 MG 24 hr tablet Commonly known as: IMDUR   losartan 25 MG tablet Commonly known as: COZAAR   spironolactone 25 MG tablet Commonly known as: ALDACTONE     TAKE these medications   acetaminophen 325 MG tablet Commonly known as: TYLENOL Take 650 mg by mouth every 6 (six) hours as needed for mild pain.   aminocaproic acid 5 % Soln Commonly known as: AMICAR Take 10 mLs by mouth every hour as needed (for  persistent oozing).   amiodarone 200 MG tablet Commonly known as: PACERONE Take 1 tablet by mouth once daily   carvedilol 3.125 MG tablet Commonly known as: COREG Take 1 tablet (3.125 mg total) by mouth 2 (two) times daily with a meal. What changed:   medication strength  how much to take  how to take this  when to take this  additional instructions   divalproex 500 MG 24 hr tablet Commonly known as: DEPAKOTE ER Take 1 tablet every night What changed:   how much to take  how to take this  when to take this  additional instructions   DULoxetine 30 MG capsule Commonly known as: CYMBALTA Take 1 capsule (30 mg total) by mouth daily.   feeding supplement (ENSURE ENLIVE) Liqd Take 237 mLs by mouth 3 (three) times daily between meals.   ferrous sulfate 325 (65 FE) MG tablet Take 1 tablet (325 mg total) by mouth 2 (two) times daily with a meal.   levETIRAcetam 1000 MG tablet Commonly known as: KEPPRA Take 1 tablet (1,000 mg total) by mouth 2 (two) times daily.   phenytoin 100 MG ER capsule Commonly known as: Dilantin Take 1 capsule (100 mg total) by mouth 2 (two) times daily.   polyethylene glycol 17 g packet Commonly known as: MIRALAX / GLYCOLAX Take 17 g by mouth daily as needed. What changed: reasons to take this   simvastatin 20 MG tablet Commonly known as: ZOCOR TAKE 1 TABLET BY MOUTH ONCE DAILY AT 6PM   tamsulosin 0.4 MG Caps capsule Commonly known as: FLOMAX Take 1 capsule by mouth once daily   traMADol 50 MG tablet Commonly known as: ULTRAM Take 1 tablet (50 mg total) by mouth every 6 (six) hours as needed for moderate pain. for pain     ASK your doctor about these medications   magic mouthwash w/lidocaine Soln Take 15 mLs by mouth 4 (four) times daily as needed for up to 3 days for mouth pain. Ask about: Should I take this medication?      No Known Allergies Discharge Instructions    Complete patient signature process for consent form    Complete by: As directed    Diet - low sodium heart healthy   Complete by: As directed    Discharge instructions   Complete by: As directed    It is important that you read the given instructions as well as go over your medication list with RN to help you understand your care after this hospitalization.  Discharge Instructions: Please follow-up with PCP in 1-2 weeks  Please request your primary care physician to go over all Hospital Tests and Procedure/Radiological results at the follow up. Please get all Hospital records sent to your PCP by signing hospital release before you go home.   Do not take more than prescribed Pain, Sleep and Anxiety Medications. You  were cared for by a hospitalist during your hospital stay. If you have any questions about your discharge medications or the care you received while you were in the hospital after you are discharged, you can call the unit @UNIT @ you were admitted to and ask to speak with the hospitalist on call if the hospitalist that took care of you is not available.  Once you are discharged, your primary care physician will handle any further medical issues. Please note that NO REFILLS for any discharge medications will be authorized once you are discharged, as it is imperative that you return to your primary care physician (or establish a relationship with a primary care physician if you do not have one) for your aftercare needs so that they can reassess your need for medications and monitor your lab values. You Must read complete instructions/literature along with all the possible adverse reactions/side effects for all the Medicines you take and that have been prescribed to you. Take any new Medicines after you have completely understood and accept all the possible adverse reactions/side effects. Wear Seat belts while driving. If you have smoked or chewed Tobacco in the last 2 yrs please stop smoking and/or stop any Recreational drug use.  If you  drink alcohol, please moderate the use and do not drive, operating heavy machinery, perform activities at heights, swimming or participation in water activities or provide baby sitting services under influence.   Increase activity slowly   Complete by: As directed    Practitioner attestation of consent   Complete by: As directed    I, the ordering practitioner, attest that I have discussed with the patient the benefits, risks, side effects, alternatives, likelihood of achieving goals and potential problems during recovery for the procedure listed.   Procedure: Blood Product(s)   Type and screen   Complete by: As directed    [place]      Discharge Exam: Filed Weights   08/19/19 0500 08/20/19 0500 08/21/19 0516  Weight: 100.2 kg 100.4 kg 98.1 kg   Vitals:   08/21/19 0941 08/21/19 1227  BP: 106/73 109/75  Pulse: (!) 114 (!) 104  Resp: (!) 22 20  Temp: 98 F (36.7 C) 98.7 F (37.1 C)  SpO2: 96% 99%   General: Appear in no distress, no Rash; Oral Mucosa Clear, moist. no Abnormal Mass Or lumps Cardiovascular: S1 and S2 Present, no Murmur, Respiratory:  normal respiratory effort, Bilateral Air entry present and Clear to Auscultation, no Crackles, no wheezes Abdomen: Bowel Sound present, Soft and no tenderness, no hernia Extremities: trace Pedal edema, no calf tenderness, left upper extremity edema Neurology: alert and oriented to time, place, and person affect appropriate. normal without focal findings, mental status, speech normal, alert and oriented x3, PERLA, Motor strength 5/5 and symmetric and sensation grossly normal to light touch   The results of significant diagnostics from this hospitalization (including imaging, microbiology, ancillary and laboratory) are listed below for reference.    Significant Diagnostic Studies: Dg Chest Port 1 View  Result Date: 07/25/2019 CLINICAL DATA:  Patient became unresponsive with exertion. EXAM: PORTABLE CHEST 1 VIEW  COMPARISON:  Chest CT, 04/21/2019.  Chest radiographs, 12/07/2019. FINDINGS: There stable changes from prior cardiac surgery. Cardiac silhouette is mildly enlarged. No mediastinal or hilar masses. Lungs are clear.  No pleural effusion or pneumothorax. Right anterior chest wall single lead pacemaker is stable. Skeletal structures are grossly intact. IMPRESSION: No acute cardiopulmonary disease. Electronically Signed   By: Dedra Skeens.D.  On: 07/25/2019 16:57   Vas [place] Upper Extremity Venous Duplex  Result Date: 08/20/2019 UPPER VENOUS STUDY  Indications: Edema Risk Factors: None identified. Limitations: Line, bandages and poor ultrasound/tissue interface. Comparison Study: No prior studies. Performing Technologist: Oliver Hum RVT  Examination Guidelines: A complete evaluation includes B-mode imaging, spectral Doppler, color Doppler, and power Doppler as needed of all accessible portions of each vessel. Bilateral testing is considered an integral part of a complete examination. Limited examinations for reoccurring indications may be performed as noted.  Right Findings: +----------+------------+---------+-----------+----------+-------+  RIGHT      Compressible Phasicity Spontaneous Properties Summary  +----------+------------+---------+-----------+----------+-------+  Subclavian     Full        Yes        Yes                         +----------+------------+---------+-----------+----------+-------+  Left Findings: +----------+------------+---------+-----------+----------+-------+  LEFT       Compressible Phasicity Spontaneous Properties Summary  +----------+------------+---------+-----------+----------+-------+  IJV            Full        Yes        Yes                         +----------+------------+---------+-----------+----------+-------+  Subclavian     Full        Yes        Yes                         +----------+------------+---------+-----------+----------+-------+  Axillary       Full        Yes         Yes                         +----------+------------+---------+-----------+----------+-------+  Brachial       Full        Yes        Yes                         +----------+------------+---------+-----------+----------+-------+  Radial         Full                                               +----------+------------+---------+-----------+----------+-------+  Ulnar          Full                                               +----------+------------+---------+-----------+----------+-------+  Cephalic       Full                                               +----------+------------+---------+-----------+----------+-------+  Basilic        Full                                               +----------+------------+---------+-----------+----------+-------+  Summary:  Right: No evidence of thrombosis in the subclavian.  Left: No evidence of deep vein thrombosis in the upper extremity. No evidence of superficial vein thrombosis in the upper extremity.  *See table(s) above for measurements and observations.  Diagnosing physician: Harold Barban MD Electronically signed by Harold Barban MD on 08/20/2019 at 7:33:17 AM.    Final    [place] Ekg Site Rite  Result Date: 08/07/2019 If Site Rite image not attached, placement could not be confirmed due to current cardiac rhythm.  [place] Ekg Site Rite  Result Date: 08/07/2019 If Site Rite image not attached, placement could not be confirmed due to current cardiac rhythm.  [place] Ekg Site Rite  Result Date: 08/07/2019 If Site Rite image not attached, placement could not be confirmed due to current cardiac rhythm.  [place] Ekg Site Rite  Result Date: 07/25/2019 If Site Rite image not attached, placement could not be confirmed due to current cardiac rhythm.   Microbiology: Recent Results (from the past 240 hour(s))  SARS Coronavirus 2 [facility] order, Performed in [facility] hospital lab) Nasopharyngeal Nasopharyngeal Swab     Status: None   Collection Time: 08/13/19  3:42 PM    Specimen: Nasopharyngeal Swab  Result Value Ref Range Status   SARS Coronavirus 2 NEGATIVE NEGATIVE Final    Comment: (NOTE) If result is NEGATIVE SARS-CoV-2 target nucleic acids are NOT DETECTED. The SARS-CoV-2 RNA is generally detectable in upper and lower  respiratory specimens during the acute phase of infection. The lowest  concentration of SARS-CoV-2 viral copies this assay can detect is 250  copies / mL. A negative result does not preclude SARS-CoV-2 infection  and should not be used as the sole basis for treatment or other  patient management decisions.  A negative result may occur with  improper specimen collection / handling, submission of specimen other  than nasopharyngeal swab, presence of viral mutation(s) within the  areas targeted by this assay, and inadequate number of viral copies  (<250 copies / mL). A negative result must be combined with clinical  observations, patient history, and epidemiological information. If result is POSITIVE SARS-CoV-2 target nucleic acids are DETECTED. The SARS-CoV-2 RNA is generally detectable in upper and lower  respiratory specimens dur ing the acute phase of infection.  Positive  results are indicative of active infection with SARS-CoV-2.  Clinical  correlation with patient history and other diagnostic information is  necessary to determine patient infection status.  Positive results do  not rule out bacterial infection or co-infection with other viruses. If result is PRESUMPTIVE POSTIVE SARS-CoV-2 nucleic acids MAY BE PRESENT.   A presumptive positive result was obtained on the submitted specimen  and confirmed on repeat testing.  While 2019 novel coronavirus  (SARS-CoV-2) nucleic acids may be present in the submitted sample  additional confirmatory testing may be necessary for epidemiological  and / or clinical management purposes  to differentiate between  SARS-CoV-2 and other Sarbecovirus currently known to infect humans.  If  clinically indicated additional testing with an alternate test  methodology [phone]) is advised. The SARS-CoV-2 RNA is generally  detectable in upper and lower respiratory sp ecimens during the acute  phase of infection. The expected result is Negative. Fact Sheet for Patients:  StrictlyIdeas.no Fact Sheet for Healthcare Providers: BankingDealers.co.za This test is not yet approved or cleared by the [place] FDA and has been authorized for detection and/or diagnosis of SARS-CoV-2 by FDA under an Emergency Use Authorization (EUA).  This EUA will remain  in effect (meaning this test can be used) for the duration of the COVID-19 declaration under Section 564(b)(1) of the Act, 21 U.S.C. section 360bbb-3(b)(1), unless the authorization is terminated or revoked sooner. Performed at [facility], [place] [address] [place], [place] [address]   SARS CORONAVIRUS 2 (TAT 6-12 HRS) Nasal Swab Aptima Multi Swab     Status: None   Collection Time: 08/20/19  5:52 PM   Specimen: Aptima Multi Swab; Nasal Swab  Result Value Ref Range Status   SARS Coronavirus 2 NEGATIVE NEGATIVE Final    Comment: (NOTE) SARS-CoV-2 target nucleic acids are NOT DETECTED. The SARS-CoV-2 RNA is generally detectable in upper and lower respiratory specimens during the acute phase of infection. Negative results do not preclude SARS-CoV-2 infection, do not rule out co-infections with other pathogens, and should not be used as the sole basis for treatment or other patient management decisions. Negative results must be combined with clinical observations, patient history, and epidemiological information. The expected result is Negative. Fact Sheet for Patients: SugarRoll.be Fact Sheet for Healthcare Providers: https://www.woods-mathews.com/ This test is not yet approved or cleared by the [place] FDA and  has  been authorized for detection and/or diagnosis of SARS-CoV-2 by FDA under an Emergency Use Authorization (EUA). This EUA will remain  in effect (meaning this test can be used) for the duration of the COVID-19 declaration under Section 56 4(b)(1) of the Act, 21 U.S.C. section 360bbb-3(b)(1), unless the authorization is terminated or revoked sooner. Performed at [facility], [place] [address] [place], [place] [address]      Labs: CBC: Recent Labs  Lab 08/15/19 [phone] 08/16/19 0352 08/19/19 0443 08/20/19 0351 08/21/19 0321  WBC 4.2 4.6 4.7 4.7 5.0  NEUTROABS 2.3 2.3  --   --   --   HGB 7.9* 8.2* 8.9* 8.8* 8.7*  HCT 26.6* 27.1* 28.7* 29.1* 29.0*  MCV 97.1 97.1 98.3 99.0 97.6  PLT 204 216 223 216 [id number]   Basic Metabolic Panel: Recent Labs  Lab 08/15/19 0919 08/16/19 0352 08/20/19 0351 08/21/19 0321  NA 139 139 136 136  K 4.5 4.9 5.2* 4.9  CL 113* 115* 109 110  CO2 22 22 22 22   GLUCOSE 80 74 86 76  BUN 12 11 18 19   CREATININE 0.92 0.95 1.06 1.23  CALCIUM 8.0* 7.8* 7.8* 8.0*  MG  --  1.8 2.0  --   PHOS 3.9 3.4  --   --    Liver Function Tests: Recent Labs  Lab 08/15/19 0919 08/16/19 0352  ALBUMIN 2.4* 2.4*   No results for input(s): LIPASE, AMYLASE in the last 168 hours. No results for input(s): AMMONIA in the last 168 hours. Cardiac Enzymes: No results for input(s): CKTOTAL, CKMB, CKMBINDEX, TROPONINI in the last 168 hours. BNP (last 3 results) Recent Labs    12/06/18 1917 07/21/19 1129  BNP 36.4 40.2   CBG: Recent Labs  Lab 08/20/19 1637 08/20/19 1816 08/20/19 2102 08/21/19 0728 08/21/19 1126  GLUCAP 68* 106* 102* 87 78   Time spent: 35 minutes  Signed:  Berle Mull  [facility]  08/21/2019

## 2019-08-21 NOTE — TOC Progression Note (Signed)
Transition of Care (TOC) - Progression Note    Patient Details  Name: [name]. MRN: [id number] Date of Birth: 09-Mar-1946  Transition of Care [facility]) CM/SW Contact  Purcell Mouton, RN Phone Number: 08/21/2019, 2:43 PM  Clinical Narrative:    A call to pt's [name] [phone] was made to inform her that was being transferred to [facility] today. PTAR was called.        Expected Discharge Plan and Services           Expected Discharge Date: 08/21/19                                     Social Determinants of Health (SDOH) Interventions    Readmission Risk Interventions Readmission Risk Prevention Plan 08/07/2019  Transportation Screening Complete  PCP or Specialist Appt within 3-5 Days Complete  HRI or [place] Not Complete  HRI or Home Care Consult comments To SNF  Social Work Consult for [place] Planning/Counseling Not Complete  SW consult not completed comments NA  Palliative Care Screening Not Applicable  Medication Review Press photographer) Complete  Some recent data might be hidden

## 2019-08-21 NOTE — Progress Notes (Signed)
Report called to Mitchell at [facility]. All questions and concerns answered. Patient to be transported via [facility].

## 2019-08-21 NOTE — Progress Notes (Signed)
Attempted to call report waited on hold for 10 min with no response, will call back.

## 2019-08-21 NOTE — Progress Notes (Signed)
   08/21/19 0459  Vitals  Temp 98 F (36.7 C)  Temp Source Oral  BP 96/83  MAP (mmHg) 88  BP Location Right Arm  BP Method Automatic  Patient Position (if appropriate) Sitting  Pulse Rate (!) 110  Resp (!) 22  Oxygen Therapy  SpO2 99 %  O2 Device Room Air  MEWS Score  MEWS RR 1  MEWS Pulse 1  MEWS Systolic 1  MEWS LOC 0  MEWS Temp 0  MEWS Score 3  MEWS Score Color Yellow  MEWS Assessment  Is this an acute change? Yes (Change in BP )  MEWS guidelines implemented *See Row Information* Yellow  Provider Notification  Provider Name/Title [facility]  Date Provider Notified 08/21/19  Time Provider Notified [phone]  Notification Type Page  Notification Reason Change in status (BP making a mews of 3)   Patient stable on call aware of Vitals

## 2019-08-21 NOTE — Progress Notes (Addendum)
[facility] here to transport patient to [facility]. Patient is stable for transport.

## 2019-08-21 NOTE — Consult Note (Signed)
   [facility] Inpatient Consult   08/21/2019  [facility] 12-17-1946 [id number]   [facility] Follow-up:  Per chart review, current disposition plan is for SNF. No [facility] identifiable needs at this time.  Netta Cedars, MSN, [facility] Liaison Nurse Mobile Phone [phone]  Toll free office [phone]

## 2019-08-25 ENCOUNTER — Other Ambulatory Visit: Payer: Self-pay | Admitting: *Deleted

## 2019-08-25 DIAGNOSIS — I10 Essential (primary) hypertension: Secondary | ICD-10-CM | POA: Diagnosis not present

## 2019-08-25 DIAGNOSIS — I5022 Chronic systolic (congestive) heart failure: Secondary | ICD-10-CM | POA: Diagnosis not present

## 2019-08-25 DIAGNOSIS — D500 Iron deficiency anemia secondary to blood loss (chronic): Secondary | ICD-10-CM | POA: Diagnosis not present

## 2019-08-25 DIAGNOSIS — I482 Chronic atrial fibrillation, unspecified: Secondary | ICD-10-CM | POA: Diagnosis not present

## 2019-08-25 NOTE — Patient Outreach (Signed)
[facility]) Care Management  08/25/2019  Haoxuan Orso. [birth year] [id number]   Notified that member was discharged to SNF for rehab.  [facility] will not follow while at SNF, will close case at this time.  Will reopen if new referral received upon discharge back home.  Valente David, [place], MSN [place] [phone]

## 2019-08-26 ENCOUNTER — Telehealth: Payer: Self-pay

## 2019-08-26 DIAGNOSIS — M546 Pain in thoracic spine: Secondary | ICD-10-CM | POA: Diagnosis not present

## 2019-08-26 DIAGNOSIS — M6281 Muscle weakness (generalized): Secondary | ICD-10-CM | POA: Diagnosis not present

## 2019-08-26 NOTE — Telephone Encounter (Signed)
-----   Message from Sherran Needs, NP sent at 08/11/2019  8:41 AM EDT ----- Regarding: FW: Please address ----- Message ----- From: Dorothy Spark, MD Sent: 08/10/2019   1:52 PM EDT To: [name], MD, Sherran Needs, NP  Hi, [employer] interrogated this patient's device that is ERI with maximum 56 days prior necessity of replacement.  Would you please arrange follow-up in the clinic or admission for this purpose.  There was no evidence for hypo-sensing. Thank you, [place]

## 2019-08-26 NOTE — Telephone Encounter (Signed)
Call placed to wife.  Pt is currently in a SNF.  Call placed to [facility] [phone]  Scheduled gen change with staff.  Pt will have gen change on 09/10/2019 at 1:30 p m.   PT WILL NEED RAPID COVID TEST AND LABS ON ARRIVAL D/T PT IN [facility] per Dr. Lovena Le   Pt will arrive to [facility] admitting at 11:30 am  NPO after 7:00 am  All AM meds ok  Advised will be discharged after procedure.   Call returned to wife.  Advised of procedure.  All work up complete.

## 2019-08-27 ENCOUNTER — Encounter: Payer: Self-pay | Admitting: Hematology

## 2019-09-01 ENCOUNTER — Telehealth: Payer: Self-pay | Admitting: Internal Medicine

## 2019-09-01 DIAGNOSIS — M6281 Muscle weakness (generalized): Secondary | ICD-10-CM | POA: Diagnosis not present

## 2019-09-01 DIAGNOSIS — Z95810 Presence of automatic (implantable) cardiac defibrillator: Secondary | ICD-10-CM | POA: Diagnosis not present

## 2019-09-01 DIAGNOSIS — W19XXXA Unspecified fall, initial encounter: Secondary | ICD-10-CM | POA: Diagnosis not present

## 2019-09-01 DIAGNOSIS — R0600 Dyspnea, unspecified: Secondary | ICD-10-CM | POA: Diagnosis not present

## 2019-09-01 NOTE — Telephone Encounter (Signed)
  Patient's defib fired off this morning and he felt like he couldn't breath. He is a little worried and wants to know if he should be seen sooner. He has change out scheduled for 09/10/19.

## 2019-09-01 NOTE — Telephone Encounter (Signed)
Spoke with patient. He reports he received a shock early this morning when returning from the bathroom at his facility [facility]). Reports he felt very SOB, slid to the floor, then felt a shock. No symptoms since this episode. No syncope or chest discomfort. Pt reports he does not have his monitor at the facility with him.  Spoke with Olivia Mackie, patient's nurse at facility. She confirms pt is still taking amiodarone 200mg  daily. Reports facility NP assessed pt today, recommended close monitoring. Olivia Mackie confirms that pt has not reported any symptoms since his shock this morning. Advised I will call back if Dr. Lovena Le has any new recommendations. Olivia Mackie in agreement with plan.  Reviewed with Dr. Lynett Grimes for ICD interrogation on 09/10/19 prior to generator replacement. Continue amiodarone. No changes at this time.  Spoke with pt's wife. Made her aware of plan. Advised she can try to bring monitor to facility (approved by Olivia Mackie), but not positive it will automatically reconnect as it has been disconnected for so long. Pt's wife is in agreement, all questions answered. Advised to call back if any additional questions or concerns.

## 2019-09-07 ENCOUNTER — Telehealth: Payer: Self-pay | Admitting: Internal Medicine

## 2019-09-07 NOTE — Telephone Encounter (Signed)
Patient called stating someone called him with information to his procedure tomorrow and he said he would call them back with his wife's number since she is the one who take care of all of his medication things.   Her number is [phone].

## 2019-09-07 NOTE — Telephone Encounter (Signed)
Returned call to Pt.  Advised he probably received a call from the hospital.   Advised they may or may not call him back, but his wife's number is on file for them to review.  Pt indicates understanding.

## 2019-09-10 ENCOUNTER — Ambulatory Visit (HOSPITAL_COMMUNITY)
Admission: RE | Admit: 2019-09-10 | Discharge: 2019-09-10 | Disposition: A | Discharge status: Home / Self Care | Payer: Medicare Other | Attending: Internal Medicine | Admitting: Internal Medicine

## 2019-09-10 ENCOUNTER — Other Ambulatory Visit: Payer: Self-pay

## 2019-09-10 ENCOUNTER — Encounter (HOSPITAL_COMMUNITY)
Admission: RE | Disposition: A | Discharge status: Home / Self Care | Payer: Medicare Other | Source: Home / Self Care | Attending: Internal Medicine

## 2019-09-10 DIAGNOSIS — E785 Hyperlipidemia, unspecified: Secondary | ICD-10-CM | POA: Insufficient documentation

## 2019-09-10 DIAGNOSIS — Z951 Presence of aortocoronary bypass graft: Secondary | ICD-10-CM | POA: Insufficient documentation

## 2019-09-10 DIAGNOSIS — G40909 Epilepsy, unspecified, not intractable, without status epilepticus: Secondary | ICD-10-CM | POA: Insufficient documentation

## 2019-09-10 DIAGNOSIS — Z6830 Body mass index (BMI) 30.0-30.9, adult: Secondary | ICD-10-CM | POA: Insufficient documentation

## 2019-09-10 DIAGNOSIS — Z8673 Personal history of transient ischemic attack (TIA), and cerebral infarction without residual deficits: Secondary | ICD-10-CM | POA: Diagnosis not present

## 2019-09-10 DIAGNOSIS — I5022 Chronic systolic (congestive) heart failure: Secondary | ICD-10-CM | POA: Diagnosis not present

## 2019-09-10 DIAGNOSIS — Z8614 Personal history of Methicillin resistant Staphylococcus aureus infection: Secondary | ICD-10-CM | POA: Insufficient documentation

## 2019-09-10 DIAGNOSIS — Z87891 Personal history of nicotine dependence: Secondary | ICD-10-CM | POA: Insufficient documentation

## 2019-09-10 DIAGNOSIS — I951 Orthostatic hypotension: Secondary | ICD-10-CM | POA: Diagnosis not present

## 2019-09-10 DIAGNOSIS — M6281 Muscle weakness (generalized): Secondary | ICD-10-CM | POA: Diagnosis not present

## 2019-09-10 DIAGNOSIS — Z006 Encounter for examination for normal comparison and control in clinical research program: Secondary | ICD-10-CM | POA: Diagnosis not present

## 2019-09-10 DIAGNOSIS — I2510 Atherosclerotic heart disease of native coronary artery without angina pectoris: Secondary | ICD-10-CM | POA: Diagnosis not present

## 2019-09-10 DIAGNOSIS — D682 Hereditary deficiency of other clotting factors: Secondary | ICD-10-CM | POA: Insufficient documentation

## 2019-09-10 DIAGNOSIS — I255 Ischemic cardiomyopathy: Secondary | ICD-10-CM | POA: Diagnosis not present

## 2019-09-10 DIAGNOSIS — Z79899 Other long term (current) drug therapy: Secondary | ICD-10-CM | POA: Diagnosis not present

## 2019-09-10 DIAGNOSIS — Z8249 Family history of ischemic heart disease and other diseases of the circulatory system: Secondary | ICD-10-CM | POA: Diagnosis not present

## 2019-09-10 DIAGNOSIS — I472 Ventricular tachycardia: Secondary | ICD-10-CM | POA: Diagnosis not present

## 2019-09-10 DIAGNOSIS — I4819 Other persistent atrial fibrillation: Secondary | ICD-10-CM | POA: Diagnosis not present

## 2019-09-10 DIAGNOSIS — Z20828 Contact with and (suspected) exposure to other viral communicable diseases: Secondary | ICD-10-CM | POA: Insufficient documentation

## 2019-09-10 DIAGNOSIS — I4891 Unspecified atrial fibrillation: Secondary | ICD-10-CM | POA: Insufficient documentation

## 2019-09-10 DIAGNOSIS — E669 Obesity, unspecified: Secondary | ICD-10-CM | POA: Insufficient documentation

## 2019-09-10 DIAGNOSIS — Z4502 Encounter for adjustment and management of automatic implantable cardiac defibrillator: Secondary | ICD-10-CM

## 2019-09-10 DIAGNOSIS — D62 Acute posthemorrhagic anemia: Secondary | ICD-10-CM | POA: Diagnosis not present

## 2019-09-10 DIAGNOSIS — I110 Hypertensive heart disease with heart failure: Secondary | ICD-10-CM | POA: Insufficient documentation

## 2019-09-10 HISTORY — PX: ICD GENERATOR CHANGEOUT: EP1231

## 2019-09-10 LAB — CBC
HCT: 41.5 % (ref 39.0–52.0)
Hemoglobin: 12.7 g/dL — ABNORMAL LOW (ref 13.0–17.0)
MCH: 28.9 pg (ref 26.0–34.0)
MCHC: 30.6 g/dL (ref 30.0–36.0)
MCV: 94.3 fL (ref 80.0–100.0)
Platelets: 169 10*3/uL (ref 150–400)
RBC: 4.4 MIL/uL (ref 4.22–5.81)
RDW: 16.4 % — ABNORMAL HIGH (ref 11.5–15.5)
WBC: 4.1 10*3/uL (ref 4.0–10.5)
nRBC: 0 % (ref 0.0–0.2)

## 2019-09-10 LAB — BASIC METABOLIC PANEL
Anion gap: 7 (ref 5–15)
BUN: 16 mg/dL (ref 8–23)
CO2: 23 mmol/L (ref 22–32)
Calcium: 9 mg/dL (ref 8.9–10.3)
Chloride: 106 mmol/L (ref 98–111)
Creatinine, Ser: 1.13 mg/dL (ref 0.61–1.24)
GFR calc Af Amer: >60 mL/min (ref >60)
GFR calc non Af Amer: >60 mL/min (ref >60)
Glucose, Bld: 65 mg/dL — ABNORMAL LOW (ref 70–99)
Potassium: 4.2 mmol/L (ref 3.5–5.1)
Sodium: 136 mmol/L (ref 135–145)

## 2019-09-10 LAB — SARS CORONAVIRUS 2 BY RT PCR (HOSPITAL ORDER, PERFORMED IN ~~LOC~~ HOSPITAL LAB): SARS Coronavirus 2: NEGATIVE

## 2019-09-10 LAB — SURGICAL PCR SCREEN
MRSA, PCR: NEGATIVE
Staphylococcus aureus: NEGATIVE

## 2019-09-10 SURGERY — ICD GENERATOR CHANGEOUT

## 2019-09-10 MED ORDER — CEFAZOLIN SODIUM-DEXTROSE 2-4 GM/100ML-% IV SOLN
INTRAVENOUS | Status: AC
  Filled 2019-09-10: qty 100

## 2019-09-10 MED ORDER — MIDAZOLAM HCL 5 MG/5ML IJ SOLN
INTRAMUSCULAR | Status: DC | PRN
  Administered 2019-09-10 (×2): 1 mg via INTRAVENOUS
  Administered 2019-09-10: 2 mg via INTRAVENOUS

## 2019-09-10 MED ORDER — LIDOCAINE HCL (PF) 1 % IJ SOLN
INTRAMUSCULAR | Status: DC | PRN
  Administered 2019-09-10: 50 mL

## 2019-09-10 MED ORDER — FENTANYL CITRATE (PF) 100 MCG/2ML IJ SOLN
INTRAMUSCULAR | Status: AC
  Filled 2019-09-10: qty 2

## 2019-09-10 MED ORDER — MIDAZOLAM HCL 5 MG/5ML IJ SOLN
INTRAMUSCULAR | Status: AC
  Filled 2019-09-10: qty 5

## 2019-09-10 MED ORDER — ACETAMINOPHEN 325 MG PO TABS
325.0000 mg | ORAL_TABLET | ORAL | Status: DC | PRN
  Filled 2019-09-10: qty 2

## 2019-09-10 MED ORDER — ONDANSETRON HCL 4 MG/2ML IJ SOLN: 4.0000 mg | Freq: Four times a day (QID) | INTRAMUSCULAR | Status: DC | PRN

## 2019-09-10 MED ORDER — SODIUM CHLORIDE 0.9 % IV SOLN
INTRAVENOUS | Status: DC
  Administered 2019-09-10: 13:00:00 via INTRAVENOUS

## 2019-09-10 MED ORDER — MUPIROCIN 2 % EX OINT
TOPICAL_OINTMENT | CUTANEOUS | Status: AC
  Administered 2019-09-10: 13:00:00
  Filled 2019-09-10: qty 22

## 2019-09-10 MED ORDER — SODIUM CHLORIDE 0.9 % IV SOLN
80.0000 mg | INTRAVENOUS | Status: AC
  Administered 2019-09-10: 80 mg

## 2019-09-10 MED ORDER — SODIUM CHLORIDE 0.9 % IV SOLN
INTRAVENOUS | Status: AC
  Filled 2019-09-10: qty 2

## 2019-09-10 MED ORDER — FENTANYL CITRATE (PF) 100 MCG/2ML IJ SOLN
INTRAMUSCULAR | Status: DC | PRN
  Administered 2019-09-10: 12.5 ug via INTRAVENOUS
  Administered 2019-09-10: 25 ug via INTRAVENOUS
  Administered 2019-09-10: 12.5 ug via INTRAVENOUS

## 2019-09-10 MED ORDER — LIDOCAINE HCL (PF) 1 % IJ SOLN
INTRAMUSCULAR | Status: AC
  Filled 2019-09-10: qty 60

## 2019-09-10 MED ORDER — CEFAZOLIN SODIUM-DEXTROSE 2-4 GM/100ML-% IV SOLN
2.0000 g | INTRAVENOUS | Status: AC
  Administered 2019-09-10: 2 g via INTRAVENOUS

## 2019-09-10 SURGICAL SUPPLY — 5 items
CABLE SURGICAL S-101-97-12 (CABLE) ×3 IMPLANT
DEFIBRILATOR ILIVIA NEO VR-T (ICD Generator) IMPLANT
ILIVIA NEO VR-T DF1 429531 (ICD Generator) ×3 IMPLANT
PAD PRO RADIOLUCENT 2001M-C (PAD) ×3 IMPLANT
TRAY PACEMAKER INSERTION (PACKS) ×3 IMPLANT

## 2019-09-10 NOTE — H&P (Signed)
Electrophysiology TeleHealth Note   Due to national recommendations of social distancing due to COVID 19, an audio/video telehealth visit is felt to be most appropriate for this patient at this time.  See MyChart message from today for the patient's consent to telehealth for [facility].   Date:  06/11/2019   ID:  Louis Dandy., DOB 11/03/1946, MRN [id number]  Location: patient's home  Provider location: [address] [place]  Evaluation Performed: Follow-up visit  PCP:  [name], MD       Cardiologist:  No primary care provider on file. Dr. Reine Just Electrophysiologist:  Dr Lovena Le  Chief Complaint:  "I''m doing ok."  History of Present Illness:    [facility]. is a 73 y.o. male who presents via audio/video conferencing for a telehealth visit today.  He has a longstanding DCM and chronci systolic heart failure, s/p ICD insertion and has just reached ERI. He has had some dental problems and is pending a visit to discuss removal of 17 teeth.  Today, he denies symptoms of palpitations, chest pain, shortness of breath,  lower extremity edema, dizziness, presyncope, or syncope.  The patient is otherwise without complaint today.  The patient denies symptoms of fevers, chills, cough, or new SOB worrisome for COVID 19.      Past Medical History:  Diagnosis Date  . AICD (automatic cardioverter/defibrillator) present   . Atrial fibrillation   09/22/2012  . Blood loss anemia 04/18/2017   After GI bleed from colonoscopy and polypectomy  . CAD (coronary artery disease)   . Chronic systolic heart failure ([place])   . 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 GIB (gastrointestinal bleeding) 02/20/2017  . History of MRSA infection 05/2011  . HTN (hypertension)   . Hx of adenomatous colonic polyps 02/20/2017   01/2017 - 3 cm sigmoid TV adenoma and other smaller polyps - had post-polypectomy bleed Tx w/ clips Consider  repeat colonoscopy 3 yrs [name], MD, [name]   . Hyperlipidemia   . implantable cardiac defibrillator-B[employer]ronik    Device Implanted 2006; s/p gen change 03/2011 : bleeding persistent with pocket erosion and infection; explant and reimplant  06/2011  . Ischemic cardiomyopathy    EF 15 to 20% by TTE and TEE in 09/2012.  Severe LV dysfunction  . Obesity    BMI 31 in 09/2012  . Persistent atrial fibrillation with rapid ventricular response 04/21/2014  . Retroperitoneal bleed 04/21/2019  . Seizure disorder ([place]) latest 09/30/2012  . Seizures ([place])   . Stroke [facility])          Past Surgical History:  Procedure Laterality Date  . CARDIAC CATHETERIZATION N/A 10/12/2015   Procedure: Right Heart Cath;  Surgeon: [name], MD;  Location: [place] CV LAB;  Service: Cardiovascular;  Laterality: N/A;  . CARDIOVERSION  09/24/2012   Procedure: CARDIOVERSION;  Surgeon: [name], MD;  Location: [facility] ENDOSCOPY;  Service: Cardiovascular;  Laterality: N/A;  . CARDIOVERSION N/A 12/25/2017   Procedure: CARDIOVERSION;  Surgeon: [name], MD;  Location: [facility] ORS;  Service: Cardiovascular;  Laterality: N/A;  . COLONOSCOPY W/ POLYPECTOMY  02/13/2017  . CORONARY ARTERY BYPASS GRAFT  2011   in [place]  . FLEXIBLE SIGMOIDOSCOPY N/A 02/20/2017   Procedure: FLEXIBLE SIGMOIDOSCOPY;  Surgeon: [name], MD;  Location: [facility] ENDOSCOPY;  Service: Endoscopy;  Laterality: N/A;  . FLEXIBLE SIGMOIDOSCOPY N/A 02/21/2017   Procedure: FLEXIBLE SIGMOIDOSCOPY;  Surgeon: Lajuan Lines Pyrtle,  MD;  Location: [place] ENDOSCOPY;  Service: Endoscopy;  Laterality: N/A;  . ICD    . TEE WITHOUT CARDIOVERSION  09/24/2012   Procedure: TRANSESOPHAGEAL ECHOCARDIOGRAM (TEE);  Surgeon: [name], MD;  Location: [facility] ENDOSCOPY;  Service: Cardiovascular;  Laterality: N/A;  dave/anesth, dl, cindy/echo           Current Outpatient Medications  Medication Sig Dispense Refill  . acetaminophen (TYLENOL) 325 MG  tablet Take 650 mg by mouth every 6 (six) hours as needed for mild pain.    [name] amiodarone (PACERONE) 200 MG tablet Take 1 tablet by mouth once daily 30 tablet 0  . carvedilol (COREG) 12.5 MG tablet TAKE 1 TABLET BY MOUTH TWICE DAILY WITH A MEAL (Patient taking differently: Take 12.5 mg by mouth 2 (two) times daily with a meal. ) 180 tablet 0  . colchicine 0.6 MG tablet Take 0.5 tablets (0.3 mg total) by mouth daily. 30 tablet 0  . cyclobenzaprine (FLEXERIL) 5 MG tablet Take 1 tablet (5 mg total) by mouth 3 (three) times daily as needed for muscle spasms. (Patient not taking: Reported on 04/21/2019) 30 tablet 1  . divalproex (DEPAKOTE ER) 500 MG 24 hr tablet Take 1 tablet every night (Patient taking differently: Take 500 mg by mouth daily. ) 30 tablet 11  . DULoxetine (CYMBALTA) 30 MG capsule Take 1 capsule (30 mg total) by mouth daily. 90 capsule 1  . furosemide (LASIX) 40 MG tablet Take 1 tablet (40 mg total) by mouth daily. 90 tablet 3  . hydrALAZINE (APRESOLINE) 25 MG tablet Take 1 tablet (25 mg total) by mouth 3 (three) times daily. 270 tablet 3  . isosorbide mononitrate (IMDUR) 30 MG 24 hr tablet Take 1 tablet (30 mg total) by mouth daily. 90 tablet 3  . levETIRAcetam (KEPPRA) 1000 MG tablet Take 1 tablet (1,000 mg total) by mouth 2 (two) times daily. 60 tablet 11  . losartan (COZAAR) 25 MG tablet TAKE 1 TABLET BY MOUTH ONCE DAILY (Patient not taking: No sig reported) 90 tablet 3  . phenytoin (DILANTIN) 100 MG ER capsule Take 1 capsule (100 mg total) by mouth 2 (two) times daily. 60 capsule 11  . polyethylene glycol (MIRALAX / GLYCOLAX) packet Take 17 g by mouth daily as needed. (Patient not taking: Reported on 04/21/2019) 14 each 0  . senna-docusate (SENOKOT-S) 8.6-50 MG tablet Take 2 tablets by mouth 2 (two) times daily. (Patient not taking: Reported on 04/21/2019) 10 tablet 0  . simvastatin (ZOCOR) 20 MG tablet TAKE 1 TABLET BY MOUTH ONCE DAILY AT 6PM (Patient taking differently: Take 20 mg by  mouth daily at 6 PM. ) 90 tablet 0  . spironolactone (ALDACTONE) 25 MG tablet TAKE 1 TABLET BY MOUTH ONCE DAILY (Patient not taking: No sig reported) 90 tablet 1  . tamsulosin (FLOMAX) 0.4 MG CAPS capsule TAKE 1 CAPSULE BY MOUTH ONCE DAILY (Patient taking differently: Take 0.4 mg by mouth daily. ) 90 capsule 1  . Vitamin D, Ergocalciferol, (DRISDOL) 50000 units CAPS capsule Take 1 capsule (50,000 Units total) by mouth every 7 (seven) days. (Patient not taking: Reported on 04/21/2019) 12 capsule 0   No current facility-administered medications for this visit.     Allergies:   Patient has no known allergies.   Social History:  The patient  reports that he quit smoking about 10 years ago. His smoking use included pipe. He quit smokeless tobacco use about 10 years ago. He reports that he does not drink alcohol or use  drugs.   Family History:  The patient's family history includes Arthritis in his mother; Heart attack in his mother; Heart disease in his mother; Other in his father.   ROS:  Please see the history of present illness.   All other systems are personally reviewed and negative.    Exam:    Vital Signs:  There were no vitals taken for this visit.  Well appearing, alert and conversant, regular work of breathing,  good skin color Eyes- anicteric, neuro- grossly intact, skin- no apparent rash or lesions or cyanosis, mouth- oral mucosa is pink   Labs/Other Tests and Data Reviewed:    Recent Labs: 11/26/2018: Magnesium 1.8; Pro B Natriuretic peptide (BNP) 56.0; TSH 9.80 12/06/2018: B Natriuretic Peptide 36.4 05/04/2019: ALT 9; BUN 18; Creatinine, Ser 1.23; Hemoglobin 11.2; Platelets 272.0; Potassium 4.5; Sodium 137      Wt Readings from Last 3 Encounters:  04/21/19 203 lb 14.8 oz (92.5 kg)  01/29/19 218 lb (98.9 kg)  01/06/19 214 lb (97.1 kg)     Other studies personally reviewed:  Last device remote is reviewed from [place] PDF dated 06/11/19 which reveals normal  device function, no arrhythmias and ERI.   ASSESSMENT & PLAN:    1.  ICD - his device has reached ERI. I discussed timing of his ICD. We will plan for him to undergo ICD gen change in early/mid August. 2. Dental carries - he is pending removal of 17 teeth. I would hope that this could be accomplished 2 or more weeks before his ICD gen change. 3. Chronic systolic heart failure - he will continue his current meds. He is class 2.  4. COVID 19 screen The patient denies symptoms of COVID 19 at this time.  The importance of social distancing was discussed today.  Follow-up:  5-6 months Next remote: 5 months  Current medicines are reviewed at length with the patient today.   The patient does not have concerns regarding his medicines.  The following changes were made today:  none  Labs/ tests ordered today include: none No orders of the defined types were placed in this encounter.    Patient Risk:  after full review of this patients clinical status, I feel that they are at moderate risk at this time.  Today, I have spent 15 minutes with the patient with telehealth technology discussing all of the above.    Signed, [name], MD  06/11/2019 2:59 PM     EP Attending  Patient seen and examined. Agree with the findings as noted above. The patient presents for ICD gen change out. He has had problems with bleeding after his multiple dental extractions. This has resolved. He has factor 7 deficiency.   Mikle Bosworth.D.

## 2019-09-10 NOTE — Discharge Instructions (Signed)

## 2019-09-11 ENCOUNTER — Encounter (HOSPITAL_COMMUNITY): Payer: Self-pay | Admitting: Internal Medicine

## 2019-09-14 ENCOUNTER — Telehealth: Payer: Self-pay

## 2019-09-14 NOTE — Telephone Encounter (Signed)
Returned call to wife.  Pt medication list has changed for wife since his hospitalization.  Clarified that Pt should not be taking pravastatin and simvastatin.  Pt's wife says he takes simvastatin.  Removed pravastatin from list.  Medication list appears to be what he was discharged from [facility] on 08/21/2019.    Will confirm with [facility] SNF that no changes were made when he was at the SNF.  Wife would like current med list via [place].  Sent message stating med list she received at discharge from generator change was med list from hospitalization 08/21/2019.  Will continue to follow

## 2019-09-14 NOTE — Telephone Encounter (Signed)
Pt's wife called (DPR on file) wanting to talk about pt's meds and what he should be taking and not be taking. Wife would like a call back at [phone]. Please advise thanks.

## 2019-09-15 ENCOUNTER — Other Ambulatory Visit: Payer: Self-pay

## 2019-09-15 ENCOUNTER — Ambulatory Visit (INDEPENDENT_AMBULATORY_CARE_PROVIDER_SITE_OTHER): Payer: Medicare Other | Admitting: Student

## 2019-09-15 ENCOUNTER — Telehealth: Payer: Self-pay

## 2019-09-15 DIAGNOSIS — I5022 Chronic systolic (congestive) heart failure: Secondary | ICD-10-CM

## 2019-09-15 NOTE — Progress Notes (Signed)
  Pt seen today for Dr. Lovena Le with oozing from pacemaker site that started yesterday am.    Large hematoma with oozing noted.  Firm, manual pressure held for >30 minutes between myself, Dr. Caryl Comes, and Levander Campion, RN.  No further oozing on repeat assessment.   Large pressure dressing applied and return instructions given.   Will see patient back in device clinic 09/17/2019. Dr. Lovena Le in office that am.   Annamaria Helling  Pager: [phone]  09/15/2019 4:55 PM

## 2019-09-15 NOTE — Telephone Encounter (Signed)
Pt had a gen change last week and is continuing to bleed. Marcell Barlow states the pt do have a problem with clotting. The blood is wetting up his shirt. The steri- strips are their but the bandage is taken off. He puts toilet paper on top of his steri-strips. We offer the pt an appointment for today at 3 pm per [place], [place].

## 2019-09-15 NOTE — Telephone Encounter (Signed)
See encounter note from 09/15/19.

## 2019-09-17 ENCOUNTER — Encounter (HOSPITAL_COMMUNITY): Payer: Self-pay | Admitting: Oncology

## 2019-09-17 ENCOUNTER — Other Ambulatory Visit: Payer: Self-pay | Admitting: Hematology

## 2019-09-17 ENCOUNTER — Ambulatory Visit (INDEPENDENT_AMBULATORY_CARE_PROVIDER_SITE_OTHER): Payer: Medicare Other | Admitting: Student

## 2019-09-17 ENCOUNTER — Ambulatory Visit (HOSPITAL_COMMUNITY)
Admission: RE | Disposition: A | Discharge status: Home / Self Care | Payer: Self-pay | Source: Ambulatory Visit | Attending: Internal Medicine

## 2019-09-17 ENCOUNTER — Other Ambulatory Visit: Payer: Self-pay

## 2019-09-17 ENCOUNTER — Ambulatory Visit (HOSPITAL_COMMUNITY)
Admission: RE | Admit: 2019-09-17 | Discharge: 2019-09-18 | Disposition: A | Discharge status: Home / Self Care | Payer: Medicare Other | Source: Ambulatory Visit | Attending: Internal Medicine | Admitting: Internal Medicine

## 2019-09-17 ENCOUNTER — Other Ambulatory Visit (HOSPITAL_COMMUNITY)
Admission: RE | Admit: 2019-09-17 | Discharge: 2019-09-17 | Disposition: A | Discharge status: Home / Self Care | Payer: Medicare Other | Source: Ambulatory Visit | Attending: Internal Medicine | Admitting: Internal Medicine

## 2019-09-17 ENCOUNTER — Other Ambulatory Visit (HOSPITAL_COMMUNITY): Payer: Medicare Other

## 2019-09-17 DIAGNOSIS — Z79899 Other long term (current) drug therapy: Secondary | ICD-10-CM | POA: Diagnosis not present

## 2019-09-17 DIAGNOSIS — L7632 Postprocedural hematoma of skin and subcutaneous tissue following other procedure: Secondary | ICD-10-CM | POA: Diagnosis not present

## 2019-09-17 DIAGNOSIS — Z20828 Contact with and (suspected) exposure to other viral communicable diseases: Secondary | ICD-10-CM | POA: Insufficient documentation

## 2019-09-17 DIAGNOSIS — G40909 Epilepsy, unspecified, not intractable, without status epilepticus: Secondary | ICD-10-CM | POA: Diagnosis not present

## 2019-09-17 DIAGNOSIS — Z951 Presence of aortocoronary bypass graft: Secondary | ICD-10-CM | POA: Insufficient documentation

## 2019-09-17 DIAGNOSIS — R779 Abnormality of plasma protein, unspecified: Secondary | ICD-10-CM

## 2019-09-17 DIAGNOSIS — I5022 Chronic systolic (congestive) heart failure: Secondary | ICD-10-CM

## 2019-09-17 DIAGNOSIS — T82837A Hemorrhage of cardiac prosthetic devices, implants and grafts, initial encounter: Secondary | ICD-10-CM | POA: Diagnosis present

## 2019-09-17 DIAGNOSIS — I2510 Atherosclerotic heart disease of native coronary artery without angina pectoris: Secondary | ICD-10-CM | POA: Diagnosis not present

## 2019-09-17 DIAGNOSIS — Z8673 Personal history of transient ischemic attack (TIA), and cerebral infarction without residual deficits: Secondary | ICD-10-CM | POA: Insufficient documentation

## 2019-09-17 DIAGNOSIS — I110 Hypertensive heart disease with heart failure: Secondary | ICD-10-CM | POA: Diagnosis not present

## 2019-09-17 DIAGNOSIS — S20219A Contusion of unspecified front wall of thorax, initial encounter: Secondary | ICD-10-CM

## 2019-09-17 DIAGNOSIS — I255 Ischemic cardiomyopathy: Secondary | ICD-10-CM | POA: Diagnosis not present

## 2019-09-17 DIAGNOSIS — I5042 Chronic combined systolic (congestive) and diastolic (congestive) heart failure: Secondary | ICD-10-CM | POA: Insufficient documentation

## 2019-09-17 DIAGNOSIS — D509 Iron deficiency anemia, unspecified: Secondary | ICD-10-CM | POA: Diagnosis not present

## 2019-09-17 DIAGNOSIS — I5020 Unspecified systolic (congestive) heart failure: Secondary | ICD-10-CM

## 2019-09-17 DIAGNOSIS — E785 Hyperlipidemia, unspecified: Secondary | ICD-10-CM | POA: Insufficient documentation

## 2019-09-17 DIAGNOSIS — Z95810 Presence of automatic (implantable) cardiac defibrillator: Secondary | ICD-10-CM | POA: Insufficient documentation

## 2019-09-17 DIAGNOSIS — I4891 Unspecified atrial fibrillation: Secondary | ICD-10-CM | POA: Insufficient documentation

## 2019-09-17 DIAGNOSIS — S20211A Contusion of right front wall of thorax, initial encounter: Secondary | ICD-10-CM | POA: Diagnosis not present

## 2019-09-17 HISTORY — PX: POCKET REVISION/RELOCATION: EP1222

## 2019-09-17 LAB — CBC
HCT: 35.5 % — ABNORMAL LOW (ref 39.0–52.0)
Hemoglobin: 11.1 g/dL — ABNORMAL LOW (ref 13.0–17.0)
MCH: 28.9 pg (ref 26.0–34.0)
MCHC: 31.3 g/dL (ref 30.0–36.0)
MCV: 92.4 fL (ref 80.0–100.0)
Platelets: 253 10*3/uL (ref 150–400)
RBC: 3.84 MIL/uL — ABNORMAL LOW (ref 4.22–5.81)
RDW: 16.1 % — ABNORMAL HIGH (ref 11.5–15.5)
WBC: 4.9 10*3/uL (ref 4.0–10.5)
nRBC: 0 % (ref 0.0–0.2)

## 2019-09-17 LAB — PROTIME-INR
INR: 1.4 — ABNORMAL HIGH (ref 0.8–1.2)
Prothrombin Time: 16.9 seconds — ABNORMAL HIGH (ref 11.4–15.2)

## 2019-09-17 LAB — APTT: aPTT: 45 seconds — ABNORMAL HIGH (ref 24–36)

## 2019-09-17 LAB — SARS CORONAVIRUS 2 BY RT PCR (HOSPITAL ORDER, PERFORMED IN ~~LOC~~ HOSPITAL LAB): SARS Coronavirus 2: NEGATIVE

## 2019-09-17 SURGERY — POCKET REVISION/RELOCATION

## 2019-09-17 MED ORDER — LIDOCAINE HCL 1 % IJ SOLN
INTRAMUSCULAR | Status: AC
  Filled 2019-09-17: qty 20

## 2019-09-17 MED ORDER — CEFAZOLIN SODIUM-DEXTROSE 1-4 GM/50ML-% IV SOLN
1.0000 g | Freq: Four times a day (QID) | INTRAVENOUS | Status: AC
  Administered 2019-09-17 – 2019-09-18 (×3): 1 g via INTRAVENOUS
  Filled 2019-09-17 (×3): qty 50

## 2019-09-17 MED ORDER — DULOXETINE HCL 30 MG PO CPEP
30.0000 mg | ORAL_CAPSULE | Freq: Every day | ORAL | Status: DC
  Administered 2019-09-18: 30 mg via ORAL
  Filled 2019-09-17: qty 1

## 2019-09-17 MED ORDER — MIDAZOLAM HCL 5 MG/5ML IJ SOLN
INTRAMUSCULAR | Status: AC
  Filled 2019-09-17: qty 5

## 2019-09-17 MED ORDER — ENSURE ENLIVE PO LIQD
237.0000 mL | Freq: Three times a day (TID) | ORAL | Status: DC
  Administered 2019-09-18: 237 mL via ORAL

## 2019-09-17 MED ORDER — SODIUM CHLORIDE 0.9 % IV SOLN: 250.0000 mL | INTRAVENOUS | Status: DC | PRN

## 2019-09-17 MED ORDER — SODIUM CHLORIDE 0.9 % IV SOLN
INTRAVENOUS | Status: AC
  Filled 2019-09-17: qty 2

## 2019-09-17 MED ORDER — CEFAZOLIN SODIUM-DEXTROSE 2-4 GM/100ML-% IV SOLN
2.0000 g | INTRAVENOUS | Status: AC
  Administered 2019-09-17: 2 g via INTRAVENOUS

## 2019-09-17 MED ORDER — AMIODARONE HCL 200 MG PO TABS
200.0000 mg | ORAL_TABLET | Freq: Every day | ORAL | Status: DC
  Administered 2019-09-18: 200 mg via ORAL
  Filled 2019-09-17: qty 1

## 2019-09-17 MED ORDER — ONDANSETRON HCL 4 MG/2ML IJ SOLN: 4.0000 mg | Freq: Four times a day (QID) | INTRAMUSCULAR | Status: DC | PRN

## 2019-09-17 MED ORDER — SODIUM CHLORIDE 0.9% FLUSH
3.0000 mL | INTRAVENOUS | Status: DC | PRN
  Administered 2019-09-17: 3 mL via INTRAVENOUS
  Filled 2019-09-17: qty 3

## 2019-09-17 MED ORDER — CEFAZOLIN SODIUM-DEXTROSE 2-4 GM/100ML-% IV SOLN
INTRAVENOUS | Status: AC
  Filled 2019-09-17: qty 100

## 2019-09-17 MED ORDER — LIDOCAINE HCL (PF) 1 % IJ SOLN
INTRAMUSCULAR | Status: DC | PRN
  Administered 2019-09-17: 50 mL via INTRADERMAL

## 2019-09-17 MED ORDER — TRAMADOL HCL 50 MG PO TABS: 50.0000 mg | ORAL_TABLET | Freq: Four times a day (QID) | ORAL | Status: DC | PRN

## 2019-09-17 MED ORDER — PHENYTOIN SODIUM EXTENDED 100 MG PO CAPS
100.0000 mg | ORAL_CAPSULE | Freq: Two times a day (BID) | ORAL | Status: DC
  Administered 2019-09-17 – 2019-09-18 (×2): 100 mg via ORAL
  Filled 2019-09-17 (×3): qty 1

## 2019-09-17 MED ORDER — CHLORHEXIDINE GLUCONATE 4 % EX LIQD: 60.0000 mL | Freq: Once | CUTANEOUS | Status: DC

## 2019-09-17 MED ORDER — HYDROCODONE-ACETAMINOPHEN 5-325 MG PO TABS
1.0000 | ORAL_TABLET | ORAL | Status: DC | PRN
  Administered 2019-09-18: 2 via ORAL
  Filled 2019-09-17: qty 2

## 2019-09-17 MED ORDER — LEVETIRACETAM 500 MG PO TABS
1000.0000 mg | ORAL_TABLET | Freq: Two times a day (BID) | ORAL | Status: DC
  Administered 2019-09-17 – 2019-09-18 (×2): 1000 mg via ORAL
  Filled 2019-09-17 (×2): qty 2

## 2019-09-17 MED ORDER — SODIUM CHLORIDE 0.9 % IV SOLN
80.0000 mg | INTRAVENOUS | Status: AC
  Administered 2019-09-17: 80 mg

## 2019-09-17 MED ORDER — FENTANYL CITRATE (PF) 100 MCG/2ML IJ SOLN
INTRAMUSCULAR | Status: AC
  Filled 2019-09-17: qty 2

## 2019-09-17 MED ORDER — FERROUS SULFATE 325 (65 FE) MG PO TABS
325.0000 mg | ORAL_TABLET | Freq: Two times a day (BID) | ORAL | Status: DC
  Administered 2019-09-18: 325 mg via ORAL
  Filled 2019-09-17: qty 1

## 2019-09-17 MED ORDER — ACETAMINOPHEN 325 MG PO TABS: 325.0000 mg | ORAL_TABLET | ORAL | Status: DC | PRN

## 2019-09-17 MED ORDER — SODIUM CHLORIDE 0.9 % IV SOLN
Freq: Once | INTRAVENOUS | Status: AC
  Administered 2019-09-17: 17:00:00

## 2019-09-17 MED ORDER — SODIUM CHLORIDE 0.9 % IV SOLN
INTRAVENOUS | Status: DC
  Administered 2019-09-17: 12:00:00 via INTRAVENOUS

## 2019-09-17 MED ORDER — AMINOCAPROIC ACID SOLUTION 5% (50 MG/ML): 10.0000 mL | ORAL | Status: DC | PRN

## 2019-09-17 MED ORDER — DIVALPROEX SODIUM ER 500 MG PO TB24
500.0000 mg | ORAL_TABLET | Freq: Every day | ORAL | Status: DC
  Administered 2019-09-17: 500 mg via ORAL
  Filled 2019-09-17 (×2): qty 1

## 2019-09-17 MED ORDER — TAMSULOSIN HCL 0.4 MG PO CAPS
0.4000 mg | ORAL_CAPSULE | Freq: Every day | ORAL | Status: DC
  Administered 2019-09-18: 0.4 mg via ORAL
  Filled 2019-09-17: qty 1

## 2019-09-17 MED ORDER — MIDAZOLAM HCL 5 MG/5ML IJ SOLN
INTRAMUSCULAR | Status: DC | PRN
  Administered 2019-09-17 (×6): 1 mg via INTRAVENOUS
  Administered 2019-09-17: 2 mg via INTRAVENOUS

## 2019-09-17 MED ORDER — SODIUM CHLORIDE 0.9% FLUSH
3.0000 mL | Freq: Two times a day (BID) | INTRAVENOUS | Status: DC
  Administered 2019-09-18: 3 mL via INTRAVENOUS

## 2019-09-17 MED ORDER — FENTANYL CITRATE (PF) 100 MCG/2ML IJ SOLN
INTRAMUSCULAR | Status: DC | PRN
  Administered 2019-09-17 (×5): 25 ug via INTRAVENOUS

## 2019-09-17 MED ORDER — CARVEDILOL 3.125 MG PO TABS
3.1250 mg | ORAL_TABLET | Freq: Two times a day (BID) | ORAL | Status: DC
  Administered 2019-09-18: 3.125 mg via ORAL
  Filled 2019-09-17: qty 1

## 2019-09-17 SURGICAL SUPPLY — 3 items
CABLE SURGICAL S-101-97-12 (CABLE) ×3 IMPLANT
PAD PRO RADIOLUCENT 2001M-C (PAD) ×3 IMPLANT
TRAY PACEMAKER INSERTION (PACKS) ×3 IMPLANT

## 2019-09-17 NOTE — Consult Note (Signed)
[place]  [phone] Fax:([phone]) [phone]    [place]  Referring MD:  Dr. 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  The patient was recently admitted to [facility] long hospital from 07/20/2019 through 08/21/2019 following dental extraction with persistent bleeding of the mouth.  He required multiple blood transfusions during that hospitalization and also required FFP and vitamin K.  Hematology saw the patient during that admission.  He had a slightly elevated PT/INR and PTT during that hospitalization.  Additional work-up was obtained including thrombin time, von Willebrand panel, factor VII, factor II, 5, and 10.  There was also concern about underlying liver disease and possible coagulopathy related to the elevated PT/INR and PTT and he was supported with vitamin K 5 mg p.o. daily during that hospitalization.  Von Willebrand panel was normal.  Factor II and factor X assays were normal.  Factor V assay slightly low at 64% (normal 70 to 150%).  Factor VII assay during that admission was normal.  On 09/10/2019 the patient underwent ICD generator change and developed a hematoma and oozing postoperatively.  He was seen in the cardiology clinic on 9/22/121 pressure was held and pressure dressing was applied.  He was scheduled follow-up in the office again today and continued to have persistent oozing and a large hematoma at the site.  It was felt as though a pocket revision was needed to gain control of the site.  When seen today, the patient has a  pressure dressing over his right chest.  He states that he has had oozing blood which started a day or 2 after his procedure.  He denies any other bleeding.  He has no other complaints today including headaches or dizziness.  Denies chest discomfort or shortness of breath.  He has no abdominal pain, nausea, vomiting.  Hematology was asked see the patient to make recommendations regarding management of his bleeding perioperatively.   Past Medical History:  Diagnosis Date  . AICD (automatic cardioverter/defibrillator) present   . Atrial fibrillation   09/22/2012  . Blood loss anemia 04/18/2017   After GI bleed from colonoscopy and polypectomy  . CAD (coronary artery disease)   . Chronic systolic heart failure ([place])   . 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 GIB (gastrointestinal bleeding) 02/20/2017  . History of MRSA infection 05/2011  . HTN (hypertension)   . Hx of adenomatous colonic polyps 02/20/2017   01/2017 - 3 cm sigmoid TV adenoma and other smaller polyps - had post-polypectomy bleed Tx w/ clips Consider repeat colonoscopy 3 yrs [name], MD, [name]   . Hyperlipidemia   . implantable cardiac defibrillator-Biotronik    Device Implanted 2006; s/p gen change 03/2011 : bleeding persistent with pocket erosion and infection; explant and reimplant  06/2011  . Ischemic cardiomyopathy    EF 15 to 20% by TTE and TEE in 09/2012.  Severe LV dysfunction  . Obesity    BMI 31 in 09/2012  . Persistent atrial fibrillation with rapid ventricular response 04/21/2014  . Retroperitoneal bleed 04/21/2019  . Seizure disorder ([place]) latest 09/30/2012  . Seizures (  [place])   . Stroke [facility])   :    Past Surgical History:  Procedure Laterality Date  . ALVEOLOPLASTY N/A 07/26/2019   Procedure: ALVEOLOPLASTY WITH RESUTURING OF ORAL WOUND;  Surgeon: [name], [name];  Location: [facility] ORS;  Service: Oral Surgery;  Laterality: N/A;  . CARDIAC CATHETERIZATION N/A 10/12/2015    Procedure: Right Heart Cath;  Surgeon: [name], MD;  Location: [place] CV LAB;  Service: Cardiovascular;  Laterality: N/A;  . CARDIOVERSION  09/24/2012   Procedure: CARDIOVERSION;  Surgeon: [name], MD;  Location: [facility] ENDOSCOPY;  Service: Cardiovascular;  Laterality: N/A;  . CARDIOVERSION N/A 12/25/2017   Procedure: CARDIOVERSION;  Surgeon: [name], MD;  Location: [facility] ORS;  Service: Cardiovascular;  Laterality: N/A;  . COLONOSCOPY W/ POLYPECTOMY  02/13/2017  . CORONARY ARTERY BYPASS GRAFT  2011   in [place] N/A 08/12/2019   Procedure: CAUTERIZATION OF ORAL BLEEDING;  Surgeon: [name], [name];  Location: [facility];  Service: Oral Surgery;  Laterality: N/A;  . FLEXIBLE SIGMOIDOSCOPY N/A 02/20/2017   Procedure: FLEXIBLE SIGMOIDOSCOPY;  Surgeon: [name], MD;  Location: [facility] ENDOSCOPY;  Service: Endoscopy;  Laterality: N/A;  . FLEXIBLE SIGMOIDOSCOPY N/A 02/21/2017   Procedure: FLEXIBLE SIGMOIDOSCOPY;  Surgeon: [name], MD;  Location[facility] ENDOSCOPY;  Service: Endoscopy;  Laterality: N/A;  . ICD    . ICD GENERATOR CHANGEOUT N/A 09/10/2019   Procedure: [place];  Surgeon: [name], MD;  Location: [place] CV LAB;  Service: Cardiovascular;  Laterality: N/A;  . SUBMANDIBULAR GLAND EXCISION N/A 08/08/2019   Procedure: suture of oral wounds;  Surgeon: [name], [name];  Location: [facility];  Service: Oral Surgery;  Laterality: N/A;  . TEE WITHOUT CARDIOVERSION  09/24/2012   Procedure: TRANSESOPHAGEAL ECHOCARDIOGRAM (TEE);  Surgeon: [name], MD;  Location: [facility] ENDOSCOPY;  Service: Cardiovascular;  Laterality: N/A;  dave/anesth, dl, cindy/echo   :   CURRENT MEDS: Current Facility-Administered Medications  Medication Dose Route Frequency Provider Last Rate Last Dose  . 0.9 %  sodium chloride infusion   Intravenous Continuous Thompson Grayer, MD 50 mL/hr at 09/17/19 1153    . ceFAZolin (ANCEF) IVPB 2g/100 mL premix  2 g Intravenous  On Call Allred, Jeneen Rinks, MD      . chlorhexidine (HIBICLENS) 4 % liquid 4 application  60 mL Topical Once Allred, James, MD      . gentamicin (GARAMYCIN) 80 mg in sodium chloride 0.9 % 500 mL irrigation  80 mg Irrigation On Call Thompson Grayer, MD          No Known Allergies:  Family History  Problem Relation Age of Onset  . Arthritis Mother   . Heart disease Mother   . Heart attack Mother   . Other Father        smoker  . Hypertension Neg Hx        unknown  . Stroke Neg Hx        unknown  :  Social History   Socioeconomic History  . Marital status: Married    Spouse name: Not on file  . Number of children: Not on file  . Years of education: Not on file  . Highest education level: Not on file  Occupational History  . Not on file  Social Needs  . Financial resource strain: Not on file  . Food insecurity    Worry: Not on file    Inability: Not on file  .  Transportation needs    Medical: Not on file    Non-medical: Not on file  Tobacco Use  . Smoking status: Former Smoker    Types: Pipe    Quit date: 09/21/2008    Years since quitting: 10.9  . Smokeless tobacco: Former Systems developer    Quit date: 09/21/2008  Substance and Sexual Activity  . Alcohol use: No  . Drug use: No  . Sexual activity: Not Currently    Birth control/protection: None  Lifestyle  . Physical activity    Days per week: Not on file    Minutes per session: Not on file  . Stress: Not on file  Relationships  . Social Herbalist on phone: Not on file    Gets together: Not on file    Attends religious service: Not on file    Active member of club or organization: Not on file    Attends meetings of clubs or organizations: Not on file    Relationship status: Not on file  . Intimate partner violence    Fear of current or ex partner: Not on file    Emotionally abused: Not on file    Physically abused: Not on file    Forced sexual activity: Not on file  Other Topics Concern  . Not on file   Social History Narrative   ** Merged History Encounter **      :  REVIEW OF SYSTEMS: A comprehensive 14 point review of systems was negative except as noted in the HPI.  Exam: Patient Vitals for the past 24 hrs:  BP Temp Temp src Pulse Resp SpO2 Height Weight  09/17/19 1044 125/82 98.7 F (37.1 C) Skin 81 16 100 % 5\' 11"  (1.803 m) 210 lb (95.3 kg)    General:  well-nourished in no acute distress.   Eyes:  no scleral icterus.   ENT: Edentulous.  No bleeding in the oral cavity.  There were no oropharyngeal lesions.   Neck was without thyromegaly.   Lymphatics:  Negative cervical, supraclavicular or axillary adenopathy.   Respiratory: lungs were clear bilaterally without wheezing or crackles.   Cardiovascular: Irregular.  2/6 systolic murmur.  No edema. GI:  abdomen was soft, flat, nontender, nondistended, without organomegaly.  Musculoskeletal:  no spinal tenderness of palpation of vertebral spine.   Skin exam was without ecchymosis, petechiae.  Pressure dressing noted to right chest.  Clean dry and intact. Neuro exam was nonfocal.  Patient was alert and oriented.  Attention was good.   Language was appropriate.  Mood was normal without depression.  Speech was not pressured.  Thought content was not tangential.    LABS:  Lab Results  Component Value Date   WBC 4.9 09/17/2019   HGB 11.1 (L) 09/17/2019   HCT 35.5 (L) 09/17/2019   PLT 253 09/17/2019   GLUCOSE 65 (L) 09/10/2019   CHOL 158 07/16/2017   TRIG 82.0 07/16/2017   HDL 51.90 07/16/2017   LDLCALC 90 07/16/2017   ALT 14 08/06/2019   AST 31 08/06/2019   NA 136 09/10/2019   K 4.2 09/10/2019   CL 106 09/10/2019   CREATININE 1.13 09/10/2019   BUN 16 09/10/2019   CO2 23 09/10/2019   PSA 7.61 (H) 11/07/2017   INR 1.4 (H) 09/17/2019   HGBA1C 5.2 07/25/2019    Vas [place] Upper Extremity Venous Duplex  Result Date: 08/20/2019 UPPER VENOUS STUDY  Indications: Edema Risk Factors: None identified. Limitations: Line, bandages  and poor ultrasound/tissue interface.  Comparison Study: No prior studies. Performing Technologist: Oliver Hum RVT  Examination Guidelines: A complete evaluation includes B-mode imaging, spectral Doppler, color Doppler, and power Doppler as needed of all accessible portions of each vessel. Bilateral testing is considered an integral part of a complete examination. Limited examinations for reoccurring indications may be performed as noted.  Right Findings: +----------+------------+---------+-----------+----------+-------+ RIGHT     CompressiblePhasicitySpontaneousPropertiesSummary +----------+------------+---------+-----------+----------+-------+ Subclavian    Full       Yes       Yes                      +----------+------------+---------+-----------+----------+-------+  Left Findings: +----------+------------+---------+-----------+----------+-------+ LEFT      CompressiblePhasicitySpontaneousPropertiesSummary +----------+------------+---------+-----------+----------+-------+ IJV           Full       Yes       Yes                      +----------+------------+---------+-----------+----------+-------+ Subclavian    Full       Yes       Yes                      +----------+------------+---------+-----------+----------+-------+ Axillary      Full       Yes       Yes                      +----------+------------+---------+-----------+----------+-------+ Brachial      Full       Yes       Yes                      +----------+------------+---------+-----------+----------+-------+ Radial        Full                                          +----------+------------+---------+-----------+----------+-------+ Ulnar         Full                                          +----------+------------+---------+-----------+----------+-------+ Cephalic      Full                                           +----------+------------+---------+-----------+----------+-------+ Basilic       Full                                          +----------+------------+---------+-----------+----------+-------+  Summary:  Right: No evidence of thrombosis in the subclavian.  Left: No evidence of deep vein thrombosis in the upper extremity. No evidence of superficial vein thrombosis in the upper extremity.  *See table(s) above for measurements and observations.  Diagnosing physician: Louis Barban MD Electronically signed by Louis Barban MD on 08/20/2019 at 7:33:17 AM.    Final     ASSESSMENT AND PLAN:  73 yo patient with multiple medical co-morbidities included afib, severe systolic CHF, CVA, ? factor VII deficiency with previous post-op bleeding issues with pacemaker placement, TEE.  ? Factor VII deficiency -  vs coumadin effect in past -FVII levels on 07/22/2019 69% which is WNL, Platelet function assay on 07/29/2019 WNL -PT and PTT mixing studies though previously no concern with factor inhibitor. -Von Willebrand panel negative -Factor II and factor X assays performed on 08/03/2019 were normal -Factor V assay performed on 08/03/2019 64% -Has persistently mildly elevated PTT and PT/INR and has required vitamin K 5 mg p.o. daily during his last hospitalization -there is some concern for possible CPVC of liver and possible related coagulopathy causing the mild elevation of the PT and PTT. -not on anticoagulation for afib- due to significant bleeding issues -Would avoid systemic use of antifibrinolytics given CHF and afib and increased risk of CVA and thrombosis. -Factor VII assay pending today.  Based on prior lab result, no indication for factor VII replacement.  Elevated M spike SPEP performed on 07/29/2019 with M spike of 1.6 and IgG 2155 He has outpatient follow-up already scheduled with Dr. Irene Limbo for further recommendations  Iron deficiency anemia -Secondary to blood loss -Hemoglobin 11.1 today no  transfusion is indicated -On ferrous sulfate as an outpatient  Further recommendations pending after review by on-call hematologist.  Thank you for this referral.  Mikey Bussing, DNP, AGPCNP-BC, AOCNP

## 2019-09-17 NOTE — Interval H&P Note (Signed)
History and Physical Interval Note:  09/17/2019 3:15 PM  [facility].  has presented today for surgery, with the diagnosis of Hemotoma.  The various methods of treatment have been discussed with the patient and family. After consideration of risks, benefits and other options for treatment, the patient has consented to  Hematoma evacuation/ pocket revision as a surgical intervention.  The patient's history has been reviewed, patient examined, no change in status, stable for surgery.  I have reviewed the patient's chart and labs.  Questions were answered to the patient's satisfaction.     Louis Ford

## 2019-09-17 NOTE — Progress Notes (Signed)
Pt resting quietly, no complaints.  Waiting for hematologist consult.

## 2019-09-17 NOTE — Progress Notes (Signed)
Hematology in to see pt. No further bleeding noted.  No complaints.

## 2019-09-17 NOTE — Telephone Encounter (Signed)
Pt and wife in office today.  Confirmed current med list on MyChart is correct.

## 2019-09-17 NOTE — Progress Notes (Signed)
Pt with repeat hematoma check from 09/15/2019.  Large pressure dressing removed. Large hematoma over ICD site noted with minimal improvement from 09/15/2019. Moderate amount of drainage on gauze with bright red blood still noted on dressing.   Pt began to ooze again after several minutes. Firm pressure held for > 15 minutes. Dr Lovena Le in to assess.  With continued drainage and large hematoma, patient will need pocket revision.  Direct admission arranged for today.   Shirley Friar, PA-C  09/17/2019 9:57 AM

## 2019-09-17 NOTE — H&P (Addendum)
Cardiology Admission History and Physical:   Patient ID: [facility]. MRN: [id number]; DOB: 1946/09/19   Admission date: 09/17/2019  Primary Care Provider: [name], MD Primary Cardiologist: Dr. Haroldine Laws Primary Electrophysiologist:  Dr. Lovena Le  Chief Complaint:  Large hematoma, persistent oozing post ICD generator change  Patient Profile:   [facility]. is a 73 y.o. male with PMHx of CABG, ICM w/ICD, chronic CHF (systolic), HTN, HLD, and AFib, prior stroke, Favtor 7 deficiency  History of Present Illness:   Mr. Louis Ford underwent ICD generator change 09/10/2019, he developed a hematoma and oozing, was seen in clinic 09/15/2019, pressure was held and pressure dressing applied.  He was planned to be seen again today, when the pressure dressing was removed, he was noted to have a large hematoma and site began to ooz again.   The patient tells me that prior to the dressing being removed, he had not noticed any bleeding outside of the dressing at home.  Dr. Lovena Le evaluated the patient this AM in clinic and felt pocket revision was needed, to gain control of his site.  He reached out to hematology service, and they are aware of the patient's being sent over, pending procedure and will see him and make recommendations pre-op/peri-procedure.  He has h/o factor 7 deficiency, follows with  Hematology.  I note that [place] he had a fall suffered large retroperitoneal hematoma his [place] was stopped then and not resumed.    The patient reports tenderness to the site/hematoma, otherwise no complaints.  He does not want pain medicine currently. He denies any CP, SPB.  Denies any cough, SOB, fever, or symptoms of illness, no covid exposures.  Device information Biotronik single chamber ICD, implanted 2006, gen changes 2012, and 09/10/2019  Heart Pathway Score:     Past Medical History:  Diagnosis Date  . AICD (automatic cardioverter/defibrillator) present   . Atrial  fibrillation   09/22/2012  . Blood loss anemia 04/18/2017   After GI bleed from colonoscopy and polypectomy  . CAD (coronary artery disease)   . Chronic systolic heart failure ([place])   . 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 GIB (gastrointestinal bleeding) 02/20/2017  . History of MRSA infection 05/2011  . HTN (hypertension)   . Hx of adenomatous colonic polyps 02/20/2017   01/2017 - 3 cm sigmoid TV adenoma and other smaller polyps - had post-polypectomy bleed Tx w/ clips Consider repeat colonoscopy 3 yrs [name], MD, [name]   . Hyperlipidemia   . implantable cardiac defibrillator-Biotronik    Device Implanted 2006; s/p gen change 03/2011 : bleeding persistent with pocket erosion and infection; explant and reimplant  06/2011  . Ischemic cardiomyopathy    EF 15 to 20% by TTE and TEE in 09/2012.  Severe LV dysfunction  . Obesity    BMI 31 in 09/2012  . Persistent atrial fibrillation with rapid ventricular response 04/21/2014  . Retroperitoneal bleed 04/21/2019  . Seizure disorder ([place]) latest 09/30/2012  . Seizures ([place])   . Stroke [facility])     Past Surgical History:  Procedure Laterality Date  . ALVEOLOPLASTY N/A 07/26/2019   Procedure: ALVEOLOPLASTY WITH RESUTURING OF ORAL WOUND;  Surgeon: [name], [name];  Location: [facility] ORS;  Service: Oral Surgery;  Laterality: N/A;  . CARDIAC CATHETERIZATION N/A 10/12/2015   Procedure: Right Heart Cath;  Surgeon: [name], MD;  Location: [place] CV LAB;  Service: Cardiovascular;  Laterality: N/A;  . CARDIOVERSION  09/24/2012   Procedure: CARDIOVERSION;  Surgeon: [name], MD;  Location: [facility] ENDOSCOPY;  Service: Cardiovascular;  Laterality: N/A;  . CARDIOVERSION N/A 12/25/2017   Procedure: CARDIOVERSION;  Surgeon: [name], MD;  Location: [facility] ORS;  Service: Cardiovascular;  Laterality: N/A;  . COLONOSCOPY W/ POLYPECTOMY  02/13/2017  . CORONARY ARTERY BYPASS GRAFT  2011   in [place] N/A 08/12/2019   Procedure: CAUTERIZATION OF ORAL BLEEDING;  Surgeon: [name], [name];  Location: [facility];  Service: Oral Surgery;  Laterality: N/A;  . FLEXIBLE SIGMOIDOSCOPY N/A 02/20/2017   Procedure: FLEXIBLE SIGMOIDOSCOPY;  Surgeon: [name], MD;  Location: [facility] ENDOSCOPY;  Service: Endoscopy;  Laterality: N/A;  . FLEXIBLE SIGMOIDOSCOPY N/A 02/21/2017   Procedure: FLEXIBLE SIGMOIDOSCOPY;  Surgeon: [name], MD;  Location: [facility] ENDOSCOPY;  Service: Endoscopy;  Laterality: N/A;  . ICD    . ICD GENERATOR CHANGEOUT N/A 09/10/2019   Procedure: [place];  Surgeon: [name], MD;  Location: [place] CV LAB;  Service: Cardiovascular;  Laterality: N/A;  . SUBMANDIBULAR GLAND EXCISION N/A 08/08/2019   Procedure: suture of oral wounds;  Surgeon: [name], [name];  Location: [facility]S;  Service: Oral Surgery;  Laterality: N/A;  . TEE WITHOUT CARDIOVERSION  09/24/2012   Procedure: TRANSESOPHAGEAL ECHOCARDIOGRAM (TEE);  Surgeon: [name], MD;  Location: [facility] ENDOSCOPY;  Service: Cardiovascular;  Laterality: N/A;  dave/anesth, dl, cindy/echo      Medications Prior to Admission: Prior to Admission medications   Medication Sig Start Date End Date Taking? Authorizing Provider  acetaminophen (TYLENOL) 325 MG tablet Take 650 mg by mouth every 6 (six) hours as needed for mild pain.    [provider]  aminocaproic acid (AMICAR) 5 % SOLN Take 10 mLs by mouth every hour as needed (for persistent oozing). 08/05/19   Annita Brod, MD  amiodarone (PACERONE) 200 MG tablet Take 1 tablet by mouth once daily Patient taking differently: Take 200 mg by mouth daily.  05/21/19   [name], [name], MD  carvedilol (COREG) 3.125 MG tablet Take 1 tablet (3.125 mg total) by mouth 2 (two) times daily with a meal. 08/16/19 10/15/19  Marylyn Ishihara, Tyrone A, DO  divalproex (DEPAKOTE ER) 500 MG 24 hr tablet Take 1 tablet every night Patient taking differently: Take 500 mg by mouth at  bedtime.  01/06/19   [name], MD  DULoxetine (CYMBALTA) 30 MG capsule Take 1 capsule (30 mg total) by mouth daily. 04/02/19   [name], MD  feeding supplement, ENSURE ENLIVE, (ENSURE ENLIVE) LIQD Take 237 mLs by mouth 3 (three) times daily between meals. 08/05/19   Annita Brod, MD  ferrous sulfate 325 (65 FE) MG tablet Take 1 tablet (325 mg total) by mouth 2 (two) times daily with a meal. Patient taking differently: Take 325 mg by mouth daily with breakfast.  08/16/19 09/15/19  Cherylann Ratel A, DO  levETIRAcetam (KEPPRA) 1000 MG tablet Take 1 tablet (1,000 mg total) by mouth 2 (two) times daily. 01/06/19   [name], MD  phenytoin (DILANTIN) 100 MG ER capsule Take 1 capsule (100 mg total) by mouth 2 (two) times daily. 01/06/19   [name], MD  polyethylene glycol [name]ia [facility]aveling) packet Take 17 g by mouth daily as needed. Patient taking differently: Take 17 g by mouth daily as needed for moderate constipation.  02/25/17   Hosie Poisson, MD  simvastatin (ZOCOR) 20 MG tablet TAKE 1  TABLET BY MOUTH ONCE DAILY AT 6PM Patient not taking: Reported on 09/10/2019 06/19/19   [name], MD  tamsulosin (FLOMAX) 0.4 MG CAPS capsule Take 1 capsule by mouth once daily Patient taking differently: Take 0.4 mg by mouth daily.  07/17/19   Marrian Salvage, FNP  traMADol (ULTRAM) 50 MG tablet Take 1 tablet (50 mg total) by mouth every 6 (six) hours as needed for moderate pain. for pain Patient taking differently: Take 50 mg by mouth every 6 (six) hours as needed for moderate pain.  08/05/19   Annita Brod, MD     Allergies:   No Known Allergies  Social History:   Social History   Socioeconomic History  . Marital status: Married    Spouse name: Not on file  . Number of children: Not on file  . Years of education: Not on file  . Highest education level: Not on file  Occupational History  . Not on file  Social Needs  . Financial resource strain: Not on  file  . Food insecurity    Worry: Not on file    Inability: Not on file  . Transportation needs    Medical: Not on file    Non-medical: Not on file  Tobacco Use  . Smoking status: Former Smoker    Types: Pipe    Quit date: 09/21/2008    Years since quitting: 10.9  . Smokeless tobacco: Former Systems developer    Quit date: 09/21/2008  Substance and Sexual Activity  . Alcohol use: No  . Drug use: No  . Sexual activity: Not Currently    Birth control/protection: None  Lifestyle  . Physical activity    Days per week: Not on file    Minutes per session: Not on file  . Stress: Not on file  Relationships  . Social Herbalist on phone: Not on file    Gets together: Not on file    Attends religious service: Not on file    Active member of club or organization: Not on file    Attends meetings of clubs or organizations: Not on file    Relationship status: Not on file  . Intimate partner violence    Fear of current or ex partner: Not on file    Emotionally abused: Not on file    Physically abused: Not on file    Forced sexual activity: Not on file  Other Topics Concern  . Not on file  Social History Narrative   ** Merged History Encounter **        Family History:   The patient's family history includes Arthritis in his mother; Heart attack in his mother; Heart disease in his mother; Other in his father. There is no history of Hypertension or Stroke.    ROS:  Please see the history of present illness.  All other ROS reviewed and negative.     Physical Exam/Data:   Vitals:   09/17/19 1044  BP: 125/82  Pulse: 81  Resp: 16  Temp: 98.7 F (37.1 C)  TempSrc: Skin  SpO2: 100%  Weight: 95.3 kg  Height: 5\' 11"  (1.803 m)   No intake or output data in the 24 hours ending 09/17/19 1113 Last 3 Weights 09/17/2019 09/10/2019 08/21/2019  Weight (lbs) 210 lb 216 lb 0.8 oz 216 lb 4.3 oz  Weight (kg) 95.255 kg 98 kg 98.1 kg     Body mass index is 29.29 kg/m.  General:  Well  nourished,  well developed, in no acute distress HEENT: normal Lymph: no adenopathy Neck: no JVD Endocrine:  No thryomegaly Vascular: No carotid bruits Cardiac: irregular-irregular; 1-2/6 SM, no gallops or rubs Lungs:  CTA b/l, no wheezing, rhonchi or rales  Abd: soft, nontender  Ext: no edema Musculoskeletal:  No deformities Skin: warm and dry  Neuro:  no focal abnormalities noted Psych:  Normal affect    EKG:  Not done today, 08/18/2019, AFlutter 108bpm  Relevant CV Studies:  08/09/17: TTE Study Conclusions - Left ventricle: The cavity size was normal. There was mild   concentric hypertrophy. Systolic function was normal. The   estimated ejection fraction was in the range of 50% to 55%. Mild   hypokinesis of the inferolateral myocardium. - Ventricular septum: Septal motion showed abnormal function and   dyssynchrony. - Aortic valve: Transvalvular velocity was within the normal range.   There was no stenosis. There was no regurgitation. - Mitral valve: Transvalvular velocity was within the normal range.   There was no evidence for stenosis. There was mild regurgitation   directed posteriorly. - Left atrium: The atrium was severely dilated. - Right ventricle: The cavity size was normal. Wall thickness was   normal. Systolic function was normal. - Right atrium: The atrium was mildly dilated. - Tricuspid valve: There was mild regurgitation. - Pulmonary arteries: Systolic pressure was within the normal   range. PA peak pressure: 32 mm Hg (S).   Laboratory Data:  High Sensitivity Troponin:  No results for input(s): TROPONINIHS in the last 720 hours.    Chemistry Recent Labs  Lab 09/10/19 1213  NA 136  K 4.2  CL 106  CO2 23  GLUCOSE 65*  BUN 16  CREATININE 1.13  CALCIUM 9.0  GFRNONAA >60  GFRAA >60  ANIONGAP 7    No results for input(s): PROT, ALBUMIN, AST, ALT, ALKPHOS, BILITOT in the last 168 hours. Hematology Recent Labs  Lab 09/10/19 1213  WBC 4.1  RBC  4.40  HGB 12.7*  HCT 41.5  MCV 94.3  MCH 28.9  MCHC 30.6  RDW 16.4*  PLT 169   BNPNo results for input(s): BNP, PROBNP in the last 168 hours.  DDimer No results for input(s): DDIMER in the last 168 hours.   Radiology/Studies:  No results found.  Assessment and Plan:   1. Large Hematoma at ICD site     Noted to have persistent oozing/bleeding at the office today     Here for pocket revision  On arrival the patient had som oozing/bleeding noted by RN, she held pressure for several minutes and re-enforced his dressing No evidence currently of bleeding outside of his dressing  The patient's VSS, I offered though he declined pain medication He is in AF by exam this AM, rate controlled  Dr. Lovena Le has been in communication with hematology They will see the patient to provide peri-procedural recommendations  Dr. Lovena Le suggested starting home Amikar once labs drawn however in d/we pharmacy his home Amikar would not be helpful and used for oral bleeding/swish/spit formulation.  In d/w Dr. Lovena Le, wait on hematology for their recommendations.   For questions or updates, please contact [place] Please consult [email or web address] for contact info under        Signed, Baldwin [place], PA-C  09/17/2019 11:13 AM   I have seen, examined the patient, and reviewed the above assessment and plan.  Changes to above are made where necessary.  On exam, RRR.  R chest wall pressure dressing is in  place.  I have discussed at length with Mikey Bussing and hematology attending.  Pt with unknown but presumed bleeding disorder. Hematology does not currently recommend amikar or any other prothombotic agents for this patient.  The feel that the safest approach is for [place] to proceed directly to hematoma evacuation.  I agree. I have asked Dr Lovena Le if he would like to have Tyrex pouch placed.  He does not wish to have one used.  I will therefore achieve hemostasis and irrigate the pocket well and then  place a large pressure dressing.  Risks and benefits of this procedure were discussed with the patient who wishes to proceed.  Co Sign: Thompson Grayer, MD 09/17/2019 3:12 PM

## 2019-09-17 NOTE — Patient Instructions (Addendum)
COVID TEST-- [facility] will go to [facility] for your Covid testing.  This is a drive thru test site. There will be multiple testing areas.  Be sure to share with the first checkpoint that you are there for pre-procedure/surgery testing. This will put you into the right (yellow) lane that leads to the PAT testing team Stay in your car and the nurse team will come to your car to test you. After you are tested please go home and self quarantine until the day of your procedure.   After your Covid test, you will go to [facility] [phone] [address]. [address]). You will go to the main entrance A [employer]) and enter where the [employer] parking staff are. You will need to let the valet know that you need assistance getting to short stay. They can assist you with a wheelchair.

## 2019-09-18 DIAGNOSIS — E785 Hyperlipidemia, unspecified: Secondary | ICD-10-CM | POA: Diagnosis not present

## 2019-09-18 DIAGNOSIS — Z95810 Presence of automatic (implantable) cardiac defibrillator: Secondary | ICD-10-CM | POA: Diagnosis not present

## 2019-09-18 DIAGNOSIS — Z79899 Other long term (current) drug therapy: Secondary | ICD-10-CM | POA: Diagnosis not present

## 2019-09-18 DIAGNOSIS — I4891 Unspecified atrial fibrillation: Secondary | ICD-10-CM | POA: Diagnosis not present

## 2019-09-18 DIAGNOSIS — Z951 Presence of aortocoronary bypass graft: Secondary | ICD-10-CM | POA: Diagnosis not present

## 2019-09-18 DIAGNOSIS — S20211D Contusion of right front wall of thorax, subsequent encounter: Secondary | ICD-10-CM

## 2019-09-18 DIAGNOSIS — I5042 Chronic combined systolic (congestive) and diastolic (congestive) heart failure: Secondary | ICD-10-CM | POA: Diagnosis not present

## 2019-09-18 DIAGNOSIS — I110 Hypertensive heart disease with heart failure: Secondary | ICD-10-CM | POA: Diagnosis not present

## 2019-09-18 DIAGNOSIS — I255 Ischemic cardiomyopathy: Secondary | ICD-10-CM | POA: Diagnosis not present

## 2019-09-18 DIAGNOSIS — Z8673 Personal history of transient ischemic attack (TIA), and cerebral infarction without residual deficits: Secondary | ICD-10-CM | POA: Diagnosis not present

## 2019-09-18 DIAGNOSIS — L7632 Postprocedural hematoma of skin and subcutaneous tissue following other procedure: Secondary | ICD-10-CM | POA: Diagnosis not present

## 2019-09-18 DIAGNOSIS — I2510 Atherosclerotic heart disease of native coronary artery without angina pectoris: Secondary | ICD-10-CM | POA: Diagnosis not present

## 2019-09-18 LAB — BASIC METABOLIC PANEL
Anion gap: 6 (ref 5–15)
BUN: 17 mg/dL (ref 8–23)
CO2: 23 mmol/L (ref 22–32)
Calcium: 8.5 mg/dL — ABNORMAL LOW (ref 8.9–10.3)
Chloride: 108 mmol/L (ref 98–111)
Creatinine, Ser: 0.95 mg/dL (ref 0.61–1.24)
GFR calc Af Amer: >60 mL/min (ref >60)
GFR calc non Af Amer: >60 mL/min (ref >60)
Glucose, Bld: 84 mg/dL (ref 70–99)
Potassium: 4.4 mmol/L (ref 3.5–5.1)
Sodium: 137 mmol/L (ref 135–145)

## 2019-09-18 LAB — CBC
HCT: 36.7 % — ABNORMAL LOW (ref 39.0–52.0)
Hemoglobin: 11.5 g/dL — ABNORMAL LOW (ref 13.0–17.0)
MCH: 28.8 pg (ref 26.0–34.0)
MCHC: 31.3 g/dL (ref 30.0–36.0)
MCV: 91.8 fL (ref 80.0–100.0)
Platelets: 208 10*3/uL (ref 150–400)
RBC: 4 MIL/uL — ABNORMAL LOW (ref 4.22–5.81)
RDW: 15.7 % — ABNORMAL HIGH (ref 11.5–15.5)
WBC: 4.8 10*3/uL (ref 4.0–10.5)
nRBC: 0 % (ref 0.0–0.2)

## 2019-09-18 MED FILL — Gentamicin Sulfate Inj 40 MG/ML: INTRAMUSCULAR | Qty: 80 | Status: AC

## 2019-09-18 MED FILL — Lidocaine HCl Local Inj 1%: INTRAMUSCULAR | Qty: 60 | Status: AC

## 2019-09-18 NOTE — Discharge Instructions (Signed)
Post procedure wound/site care instructions DO NOT REMOVE DRESSING/BANDAGE THAT IS IN PLACE No exercise, lifting, pushing, pulling with the right arm.  Avoid any excessive use of the R arm. Call the office [phone]) for redness, drainage, swelling, or fever.

## 2019-09-18 NOTE — Plan of Care (Signed)
  Problem: Education: Goal: Knowledge of General Education information will improve Description: Including pain rating scale, medication(s)/side effects and non-pharmacologic comfort measures Outcome: Adequate for Discharge   

## 2019-09-18 NOTE — Plan of Care (Signed)
  Problem: Clinical Measurements: Goal: Respiratory complications will improve Outcome: Progressing Note:  Stable on room air.  No s/s of respiratory complications noted.   

## 2019-09-18 NOTE — Discharge Summary (Addendum)
ELECTROPHYSIOLOGY PROCEDURE DISCHARGE SUMMARY    Patient ID: Louis Ford.,  MRN: [id number], DOB/AGE: [birth year] 71 y.o.  Admit date: 09/17/2019 Discharge date: 09/18/2019  Primary Care Physician: [name], MD  Primary Cardiologist: Dr. Haroldine Laws Electrophysiologist: Dr. Lovena Le  Primary Discharge Diagnosis:  1. ICD site bleeding/hematoma     S/p pocket revision this admission 2. Unclear hematological disorder  Secondary Discharge Diagnosis:  1. CAD 2. ICM 3. Chronic CHF (systolic/diastolic) 4. HTN 5. AFib     CHA2DS2Vasc is 4, not on a/c s/p bleeding complications and  given bleeding tendency  No Known Allergies   Procedures This Admission:  1. 09/17/2019 CONCLUSIONS:   1. ICD pocket hematoma successfully evacuated today  2. No early apparent complications.  Brief HPI: Louis Ford. is a 73 y.o. male  with PMHx of CABG, ICM w/ICD, chronic CHF (systolic), HTN, HLD, and AFib, prior stroke, unclear hematological history, ? of Factor 7 deficiency. He underwent ICD generator change 09/10/2019, unfortunately developed a hematoma and oozing, was seen in clinic 09/15/2019, pressure was held and pressure dressing applied.  He was seen in f/u yesterday at the office, when the pressure dressing was removed, he was noted to have a large hematoma and site began to [place] again and referred to the hospital for pocket revision/evacuation.   Hospital Course:  The patient was evaluated by hematology did not recommend initiating any empiric factor replacement due to the potential risks outweighing benefits given his h/o AFib/stroke, and other comorbid conditions.  He underwent pocket revision/evacuation 09/17/2019 by Dr. Rayann Heman. Left chest is dressed with a bulky pressure dressing, this was not removed, and there was no evidence of any bleeding or re accumulation. The patient again denies any significant pain, and declines pain management. He is instructed to leave the  dressing in place, no rigorous or vigorous use of his R arm and has early clinic follow up planned for Monday, I have also spoken to his wife via telephone on instructions and follow up. The patient was examined by Dr. Lovena Le and considered stable for discharge to home.    Physical Exam: Vitals:   09/17/19 2029 09/18/19 0247 09/18/19 0641 09/18/19 0747  BP: 128/84 110/78 99/84 (!) 120/91  Pulse: 78 87 81 80  Resp: 20 (!) 21 18 (!) 22  Temp: 97.6 F (36.4 C)  98.1 F (36.7 C)   TempSrc: Oral  Oral   SpO2: 100% 100% 100% 99%  Weight:   87.5 kg   Height:        GEN- The patient is well appearing, alert and oriented x 3 today.   HEENT: normocephalic, atraumatic; sclera clear, conjunctiva pink; hearing intact; oropharynx clear; neck supple, no JVP Lungs- CTA b/l, normal work of breathing.  No wheezes, rales, rhonchi Heart- RRR, no murmurs, rubs or gallops, PMI not laterally displaced GI- soft, non-tender, non-distended Extremities- no clubbing, cyanosis, or edema MS- no significant deformity or atrophy Skin- warm and dry, no rash or lesion, right chest with large pressure dressing in place, no evidence of bleeding  hematoma/ecchymosis Psych- euthymic mood, full affect Neuro- no gross deficits   Labs:   Lab Results  Component Value Date   WBC 4.8 09/18/2019   HGB 11.5 (L) 09/18/2019   HCT 36.7 (L) 09/18/2019   MCV 91.8 09/18/2019   PLT 208 09/18/2019    Recent Labs  Lab 09/18/19 0339  NA 137  K 4.4  CL 108  CO2 23  BUN 17  CREATININE 0.95  CALCIUM 8.5*  GLUCOSE 84    Discharge Medications:  Allergies as of 09/18/2019   No Known Allergies     Medication List    TAKE these medications   acetaminophen 325 MG tablet Commonly known as: TYLENOL Take 650 mg by mouth every 6 (six) hours as needed for mild pain.   aminocaproic acid 5 % Soln Commonly known as: AMICAR Take 10 mLs by mouth every hour as needed (for persistent oozing).   amiodarone 200 MG tablet  Commonly known as: PACERONE Take 1 tablet by mouth once daily   carvedilol 3.125 MG tablet Commonly known as: COREG Take 1 tablet (3.125 mg total) by mouth 2 (two) times daily with a meal.   divalproex 500 MG 24 hr tablet Commonly known as: DEPAKOTE ER Take 1 tablet every night What changed:   how much to take  how to take this  when to take this  additional instructions   DULoxetine 30 MG capsule Commonly known as: CYMBALTA Take 1 capsule (30 mg total) by mouth daily.   feeding supplement (ENSURE ENLIVE) Liqd Take 237 mLs by mouth 3 (three) times daily between meals.   ferrous sulfate 325 (65 FE) MG tablet Take 1 tablet (325 mg total) by mouth 2 (two) times daily with a meal. What changed: when to take this   levETIRAcetam 1000 MG tablet Commonly known as: KEPPRA Take 1 tablet (1,000 mg total) by mouth 2 (two) times daily.   phenytoin 100 MG ER capsule Commonly known as: Dilantin Take 1 capsule (100 mg total) by mouth 2 (two) times daily.   polyethylene glycol 17 g packet Commonly known as: MIRALAX / GLYCOLAX Take 17 g by mouth daily as needed. What changed: reasons to take this   simvastatin 20 MG tablet Commonly known as: ZOCOR TAKE 1 TABLET BY MOUTH ONCE DAILY AT 6PM   tamsulosin 0.4 MG Caps capsule Commonly known as: FLOMAX Take 1 capsule by mouth once daily   traMADol 50 MG tablet Commonly known as: ULTRAM Take 1 tablet (50 mg total) by mouth every 6 (six) hours as needed for moderate pain. for pain What changed: additional instructions       Disposition:  Home   Discharge Instructions    Diet - low sodium heart healthy   Complete by: As directed    Increase activity slowly   Complete by: As directed      Follow-up Information    Thompson Grayer, MD Follow up.   Specialty: Cardiology Why: 09/21/2019 @ 11:15AM, wound check, surgery follow up Contact information: [place] [address] [phone]            Duration of Discharge Encounter: Greater than 30 minutes including physician time.  Venetia Night, PA-C 09/18/2019 10:05 AM  EP Attending  Patient seen and examined. He has undergone ICD pocket evacuation. His pain is controlled. He will be discharged home with the bandage left in place. He will return to our office next Monday.   Mikle Bosworth.D.

## 2019-09-21 ENCOUNTER — Other Ambulatory Visit: Payer: Self-pay

## 2019-09-21 ENCOUNTER — Encounter: Payer: Self-pay | Admitting: Internal Medicine

## 2019-09-21 ENCOUNTER — Ambulatory Visit (INDEPENDENT_AMBULATORY_CARE_PROVIDER_SITE_OTHER): Payer: Medicare Other | Admitting: Internal Medicine

## 2019-09-21 VITALS — BP 80/62 | HR 92 | Ht 71.0 in | Wt 190.0 lb

## 2019-09-21 DIAGNOSIS — I5022 Chronic systolic (congestive) heart failure: Secondary | ICD-10-CM | POA: Diagnosis not present

## 2019-09-21 NOTE — Progress Notes (Signed)
Large hematoma has recurrence since hematoma evacuation on Friday.  He has a coagulopathy for which he has been seen by hematology.  I do not feel that additional surgical evacuation would be beneficial today.  We have placed a large pressure bandage again today.  He will return daily for [place] to evaluate in the device wound clinic.  I have spoken with Dr Lovena Le who will reassess tomorrow.   Thompson Grayer MD, [place] 09/21/2019 12:05 PM

## 2019-09-21 NOTE — Patient Instructions (Signed)
Medication Instructions:  Your physician recommends that you continue on your current medications as directed. Please refer to the Current Medication list given to you today.  Labwork: None ordered.  Testing/Procedures: None ordered.  Follow-Up:  Tues Sept 29 @ 11AM with Oda Kilts, PA  Any Other Special Instructions Will Be Listed Below (If Applicable).     If you need a refill on your cardiac medications before your next appointment, please call your pharmacy.

## 2019-09-22 ENCOUNTER — Ambulatory Visit (INDEPENDENT_AMBULATORY_CARE_PROVIDER_SITE_OTHER): Payer: Medicare Other | Admitting: Student

## 2019-09-22 DIAGNOSIS — I5022 Chronic systolic (congestive) heart failure: Secondary | ICD-10-CM

## 2019-09-22 DIAGNOSIS — T148XXA Other injury of unspecified body region, initial encounter: Secondary | ICD-10-CM

## 2019-09-22 NOTE — Progress Notes (Signed)
Pt with continued large hematoma. Pt had continued oozing from the upper-outer portion of his ICD site.   He did not tolerate pocket pal, and today the pressure pouch portion was up on his shoulder due to it "cutting into his neck".  He couldn't remember if they re-applied it last night or this am.   Dr. Lovena Le assessed site.   Traditional pressure dressing re-applied, and pt instructed to keep in place, and to minimize use of R arm.   Instructed to hold gentle, but firm pressure for 15 minutes if the dressing bleeds through.   Pr Dr. Lovena Le, will see back in office 10/1 as scheduled. Pt knows to call with any additional problems.   Louis Ford [address] [place], PA-C  09/22/2019 12:49 PM

## 2019-09-23 ENCOUNTER — Other Ambulatory Visit: Payer: Self-pay | Admitting: *Deleted

## 2019-09-23 DIAGNOSIS — Z9181 History of falling: Secondary | ICD-10-CM | POA: Diagnosis not present

## 2019-09-23 DIAGNOSIS — K5900 Constipation, unspecified: Secondary | ICD-10-CM | POA: Diagnosis not present

## 2019-09-23 DIAGNOSIS — I5022 Chronic systolic (congestive) heart failure: Secondary | ICD-10-CM | POA: Diagnosis not present

## 2019-09-23 DIAGNOSIS — T82837D Hemorrhage of cardiac prosthetic devices, implants and grafts, subsequent encounter: Secondary | ICD-10-CM | POA: Diagnosis not present

## 2019-09-23 DIAGNOSIS — N183 Chronic kidney disease, stage 3 (moderate): Secondary | ICD-10-CM | POA: Diagnosis not present

## 2019-09-23 DIAGNOSIS — I255 Ischemic cardiomyopathy: Secondary | ICD-10-CM | POA: Diagnosis not present

## 2019-09-23 DIAGNOSIS — Z95810 Presence of automatic (implantable) cardiac defibrillator: Secondary | ICD-10-CM | POA: Diagnosis not present

## 2019-09-23 DIAGNOSIS — I2510 Atherosclerotic heart disease of native coronary artery without angina pectoris: Secondary | ICD-10-CM | POA: Diagnosis not present

## 2019-09-23 DIAGNOSIS — D682 Hereditary deficiency of other clotting factors: Secondary | ICD-10-CM

## 2019-09-23 DIAGNOSIS — I130 Hypertensive heart and chronic kidney disease with heart failure and stage 1 through stage 4 chronic kidney disease, or unspecified chronic kidney disease: Secondary | ICD-10-CM | POA: Diagnosis not present

## 2019-09-23 DIAGNOSIS — I951 Orthostatic hypotension: Secondary | ICD-10-CM | POA: Diagnosis not present

## 2019-09-23 DIAGNOSIS — Z8673 Personal history of transient ischemic attack (TIA), and cerebral infarction without residual deficits: Secondary | ICD-10-CM | POA: Diagnosis not present

## 2019-09-23 DIAGNOSIS — M1A9XX0 Chronic gout, unspecified, without tophus (tophi): Secondary | ICD-10-CM | POA: Diagnosis not present

## 2019-09-23 DIAGNOSIS — I4819 Other persistent atrial fibrillation: Secondary | ICD-10-CM | POA: Diagnosis not present

## 2019-09-23 DIAGNOSIS — G4700 Insomnia, unspecified: Secondary | ICD-10-CM | POA: Diagnosis not present

## 2019-09-24 ENCOUNTER — Inpatient Hospital Stay: Payer: Medicare Other

## 2019-09-24 ENCOUNTER — Inpatient Hospital Stay: Payer: Medicare Other | Attending: Hematology | Admitting: Hematology

## 2019-09-24 ENCOUNTER — Encounter: Payer: Medicare Other | Admitting: *Deleted

## 2019-09-24 ENCOUNTER — Other Ambulatory Visit: Payer: Self-pay

## 2019-09-24 ENCOUNTER — Ambulatory Visit (INDEPENDENT_AMBULATORY_CARE_PROVIDER_SITE_OTHER): Payer: Medicare Other | Admitting: Student

## 2019-09-24 VITALS — BP 102/72 | HR 69 | Temp 98.0°F | Resp 17 | Ht 71.0 in | Wt 190.8 lb

## 2019-09-24 DIAGNOSIS — I4891 Unspecified atrial fibrillation: Secondary | ICD-10-CM | POA: Insufficient documentation

## 2019-09-24 DIAGNOSIS — I5020 Unspecified systolic (congestive) heart failure: Secondary | ICD-10-CM | POA: Diagnosis not present

## 2019-09-24 DIAGNOSIS — D500 Iron deficiency anemia secondary to blood loss (chronic): Secondary | ICD-10-CM

## 2019-09-24 DIAGNOSIS — D472 Monoclonal gammopathy: Secondary | ICD-10-CM | POA: Insufficient documentation

## 2019-09-24 DIAGNOSIS — D699 Hemorrhagic condition, unspecified: Secondary | ICD-10-CM | POA: Diagnosis not present

## 2019-09-24 DIAGNOSIS — T148XXA Other injury of unspecified body region, initial encounter: Secondary | ICD-10-CM

## 2019-09-24 DIAGNOSIS — Z8673 Personal history of transient ischemic attack (TIA), and cerebral infarction without residual deficits: Secondary | ICD-10-CM | POA: Insufficient documentation

## 2019-09-24 DIAGNOSIS — D682 Hereditary deficiency of other clotting factors: Secondary | ICD-10-CM

## 2019-09-24 DIAGNOSIS — Z87891 Personal history of nicotine dependence: Secondary | ICD-10-CM | POA: Insufficient documentation

## 2019-09-24 DIAGNOSIS — E611 Iron deficiency: Secondary | ICD-10-CM | POA: Diagnosis not present

## 2019-09-24 DIAGNOSIS — K91840 Postprocedural hemorrhage and hematoma of a digestive system organ or structure following a digestive system procedure: Secondary | ICD-10-CM | POA: Insufficient documentation

## 2019-09-24 LAB — CMP (CANCER CENTER ONLY)
ALT: 17 U/L (ref 0–44)
AST: 24 U/L (ref 15–41)
Albumin: 3.3 g/dL — ABNORMAL LOW (ref 3.5–5.0)
Alkaline Phosphatase: 115 U/L (ref 38–126)
Anion gap: 5 (ref 5–15)
BUN: 22 mg/dL (ref 8–23)
CO2: 25 mmol/L (ref 22–32)
Calcium: 9.7 mg/dL (ref 8.9–10.3)
Chloride: 110 mmol/L (ref 98–111)
Creatinine: 1.12 mg/dL (ref 0.61–1.24)
GFR, Est AFR Am: >60 mL/min (ref >60)
GFR, Estimated: >60 mL/min (ref >60)
Glucose, Bld: 90 mg/dL (ref 70–99)
Potassium: 4.8 mmol/L (ref 3.5–5.1)
Sodium: 140 mmol/L (ref 135–145)
Total Bilirubin: 0.4 mg/dL (ref 0.3–1.2)
Total Protein: 9.1 g/dL — ABNORMAL HIGH (ref 6.5–8.1)

## 2019-09-24 LAB — APTT: aPTT: 42 seconds — ABNORMAL HIGH (ref 24–36)

## 2019-09-24 LAB — CBC WITH DIFFERENTIAL (CANCER CENTER ONLY)
Abs Immature Granulocytes: 0.01 10*3/uL (ref 0.00–0.07)
Basophils Absolute: 0 10*3/uL (ref 0.0–0.1)
Basophils Relative: 1 %
Eosinophils Absolute: 0.3 10*3/uL (ref 0.0–0.5)
Eosinophils Relative: 7 %
HCT: 39.5 % (ref 39.0–52.0)
Hemoglobin: 12 g/dL — ABNORMAL LOW (ref 13.0–17.0)
Immature Granulocytes: 0 %
Lymphocytes Relative: 22 %
Lymphs Abs: 0.9 10*3/uL (ref 0.7–4.0)
MCH: 27.8 pg (ref 26.0–34.0)
MCHC: 30.4 g/dL (ref 30.0–36.0)
MCV: 91.4 fL (ref 80.0–100.0)
Monocytes Absolute: 0.6 10*3/uL (ref 0.1–1.0)
Monocytes Relative: 14 %
Neutro Abs: 2.3 10*3/uL (ref 1.7–7.7)
Neutrophils Relative %: 56 %
Platelet Count: 320 10*3/uL (ref 150–400)
RBC: 4.32 MIL/uL (ref 4.22–5.81)
RDW: 16 % — ABNORMAL HIGH (ref 11.5–15.5)
WBC Count: 4.2 10*3/uL (ref 4.0–10.5)
nRBC: 0 % (ref 0.0–0.2)

## 2019-09-24 LAB — FERRITIN: Ferritin: 122 ng/mL (ref 24–336)

## 2019-09-24 LAB — PROTIME-INR
INR: 1.3 — ABNORMAL HIGH (ref 0.8–1.2)
Prothrombin Time: 15.7 seconds — ABNORMAL HIGH (ref 11.4–15.2)

## 2019-09-24 LAB — SEDIMENTATION RATE: Sed Rate: 16 mm/hr (ref 0–16)

## 2019-09-24 MED ORDER — CEPHALEXIN 500 MG PO CAPS: 500.0000 mg | ORAL_CAPSULE | Freq: Two times a day (BID) | ORAL | 0 refills | Status: DC

## 2019-09-24 NOTE — Progress Notes (Signed)
  Seen back in office for repeat wound/hematoma check from 9/29.   Pt with pressure dressing still in place.  Dark brown "old" blood evident on outside border of dressing towards axillary area.  No further bright red blood, no further oozing for patient. Pt feels stiffness and tenderness have improved.   Hematoma similar in size to 9/29, but more soft, especially along margins.   Discussed with Dr. Lovena Le.  Will keep pressure dressing in place and see back in the office Monday, 09/28/2019. With length of time he had continued bleeding, will place on prophylactic Keflex 500 mg BID x 7 days.   [name] [address] [place], PA-C  09/24/2019 3:18 PM

## 2019-09-24 NOTE — Progress Notes (Signed)
HEMATOLOGY/ONCOLOGY CONSULTATION NOTE  Date of Service: 09/24/2019  Patient Care Team: [name], MD as PCP - General (Internal Medicine)  REFERRING PHYSICIAN: [name], *  CHIEF COMPLAINTS/PURPOSE OF CONSULTATION:  Bleeding Disorder, Iron deficiency, Monoclonal Protein Spike  HISTORY OF PRESENTING ILLNESS:   [facility]. is a wonderful 73 y.o. male who has been referred to [place] by [name], for evaluation and management of Bleeding Disorder, Iron deficiency. He is accompanied today by his wife. The pt reports that he is doing well overall.   The pt reports he is moving a little slower and that he was still have slight bleeding around  His PPM pocket.  No further gum bleeding or nose bleeding.  Of note prior to the patient's visit today, pt has had EP PPM Implant completed on 09/17/2019 with results revealing  1. ICD pocket hematoma successfully evacuated today 2. No early apparent complications.   Most recent lab results (09/24/19) of CBC is as follows: all values are WNL except for Hemoglobin at 12, RDW at 16, Total Protein at 9.1, Albumin at 3.3, aPTT at 42, Prothroin time at 15.7, and INR at 1.3. Ferritin now normal at 122 Sedimentation rate 16   On review of systems, pt reports that he is moving slowly, still experiencing difficulting breathing,and denies blood In stool, urine, nose bleeds, and abnormal bruising and any other symptoms.    MEDICAL HISTORY:  Past Medical History:  Diagnosis Date  . AICD (automatic cardioverter/defibrillator) present   . Atrial fibrillation   09/22/2012  . Blood loss anemia 04/18/2017   After GI bleed from colonoscopy and polypectomy  . CAD (coronary artery disease)   . Chronic systolic heart failure ([place])   . 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 GIB (gastrointestinal bleeding) 02/20/2017  . History of MRSA infection 05/2011  . HTN (hypertension)   .  Hx of adenomatous colonic polyps 02/20/2017   01/2017 - 3 cm sigmoid TV adenoma and other smaller polyps - had post-polypectomy bleed Tx w/ clips Consider repeat colonoscopy 3 yrs [name], MD, [name]   . Hyperlipidemia   . implantable cardiac defibrillator-Biotronik    Device Implanted 2006; s/p gen change 03/2011 : bleeding persistent with pocket erosion and infection; explant and reimplant  06/2011  . Ischemic cardiomyopathy    EF 15 to 20% by TTE and TEE in 09/2012.  Severe LV dysfunction  . Obesity    BMI 31 in 09/2012  . Persistent atrial fibrillation with rapid ventricular response 04/21/2014  . Retroperitoneal bleed 04/21/2019  . Seizure disorder ([place]) latest 09/30/2012  . Seizures ([place])   . Stroke [facility])      SURGICAL HISTORY: Past Surgical History:  Procedure Laterality Date  . ALVEOLOPLASTY N/A 07/26/2019   Procedure: ALVEOLOPLASTY WITH RESUTURING OF ORAL WOUND;  Surgeon: [name], [name];  Location: [facility] ORS;  Service: Oral Surgery;  Laterality: N/A;  . CARDIAC CATHETERIZATION N/A 10/12/2015   Procedure: Right Heart Cath;  Surgeon: [name], MD;  Location: [place] CV LAB;  Service: Cardiovascular;  Laterality: N/A;  . CARDIOVERSION  09/24/2012   Procedure: CARDIOVERSION;  Surgeon: [name], MD;  Location: [facility] ENDOSCOPY;  Service: Cardiovascular;  Laterality: N/A;  . CARDIOVERSION N/A 12/25/2017   Procedure: CARDIOVERSION;  Surgeon: [name], MD;  Location: [facility];  Service: Cardiovascular;  Laterality: N/A;  . COLONOSCOPY W/ POLYPECTOMY  02/13/2017  . CORONARY ARTERY BYPASS  GRAFT  2011   in [place] N/A 08/12/2019   Procedure: CAUTERIZATION OF ORAL BLEEDING;  Surgeon: [name], [name];  Location: [facility];  Service: Oral Surgery;  Laterality: N/A;  . FLEXIBLE SIGMOIDOSCOPY N/A 02/20/2017   Procedure: FLEXIBLE SIGMOIDOSCOPY;  Surgeon: [name], MD;  Location: [facility] ENDOSCOPY;  Service: Endoscopy;  Laterality: N/A;  . FLEXIBLE SIGMOIDOSCOPY  N/A 02/21/2017   Procedure: FLEXIBLE SIGMOIDOSCOPY;  Surgeon: [name], MD;  Location: [facility] ENDOSCOPY;  Service: Endoscopy;  Laterality: N/A;  . ICD    . ICD GENERATOR CHANGEOUT N/A 09/10/2019   Procedure: [place];  Surgeon: [name], MD;  Location: [place] CV LAB;  Service: Cardiovascular;  Laterality: N/A;  . POCKET REVISION/RELOCATION N/A 09/17/2019   Procedure: POCKET REVISION/RELOCATION;  Surgeon: Thompson Grayer, MD;  Location: [place] CV LAB;  Service: Cardiovascular;  Laterality: N/A;  . SUBMANDIBULAR GLAND EXCISION N/A 08/08/2019   Procedure: suture of oral wounds;  Surgeon: [name], [name];  Location: [facility];  Service: Oral Surgery;  Laterality: N/A;  . TEE WITHOUT CARDIOVERSION  09/24/2012   Procedure: TRANSESOPHAGEAL ECHOCARDIOGRAM (TEE);  Surgeon: [name], MD;  Location: [place];  Service: Cardiovascular;  Laterality: N/A;  dave/anesth, dl, cindy/echo      SOCIAL HISTORY: Social History   Socioeconomic History  . Marital status: Married    Spouse name: Not on file  . Number of children: Not on file  . Years of education: Not on file  . Highest education level: Not on file  Occupational History  . Not on file  Social Needs  . Financial resource strain: Not on file  . Food insecurity    Worry: Not on file    Inability: Not on file  . Transportation needs    Medical: Not on file    Non-medical: Not on file  Tobacco Use  . Smoking status: Former Smoker    Types: Pipe    Quit date: 09/21/2008    Years since quitting: 11.0  . Smokeless tobacco: Former Systems developer    Quit date: 09/21/2008  Substance and Sexual Activity  . Alcohol use: No  . Drug use: No  . Sexual activity: Not Currently    Birth control/protection: None  Lifestyle  . Physical activity    Days per week: Not on file    Minutes per session: Not on file  . Stress: Not on file  Relationships  . Social Herbalist on phone: Not on file    Gets together:  Not on file    Attends religious service: Not on file    Active member of club or organization: Not on file    Attends meetings of clubs or organizations: Not on file    Relationship status: Not on file  . Intimate partner violence    Fear of current or ex partner: Not on file    Emotionally abused: Not on file    Physically abused: Not on file    Forced sexual activity: Not on file  Other Topics Concern  . Not on file  Social History Narrative   ** Merged History Encounter **         FAMILY HISTORY: Family History  Problem Relation Age of Onset  . Arthritis Mother   . Heart disease Mother   . Heart attack Mother   . Other Father        smoker  . Hypertension Neg Hx  unknown  . Stroke Neg Hx        unknown     ALLERGIES:   has No Known Allergies.   MEDICATIONS:  Current Outpatient Medications  Medication Sig Dispense Refill  . acetaminophen (TYLENOL) 325 MG tablet Take 650 mg by mouth every 6 (six) hours as needed for mild pain.    [name] aminocaproic acid (AMICAR) 5 % SOLN Take 10 mLs by mouth every hour as needed (for persistent oozing).    [name] amiodarone (PACERONE) 200 MG tablet Take 1 tablet by mouth once daily (Patient taking differently: Take 200 mg by mouth daily. ) 30 tablet 0  . carvedilol (COREG) 3.125 MG tablet Take 1 tablet (3.125 mg total) by mouth 2 (two) times daily with a meal. 60 tablet 1  . divalproex (DEPAKOTE ER) 500 MG 24 hr tablet Take 1 tablet every night (Patient taking differently: Take 500 mg by mouth at bedtime. ) 30 tablet 11  . DULoxetine (CYMBALTA) 30 MG capsule Take 1 capsule (30 mg total) by mouth daily. 90 capsule 1  . feeding supplement, ENSURE ENLIVE, (ENSURE ENLIVE) LIQD Take 237 mLs by mouth 3 (three) times daily between meals. 237 mL 12  . ferrous sulfate 325 (65 FE) MG tablet Take 1 tablet (325 mg total) by mouth 2 (two) times daily with a meal. (Patient taking differently: Take 325 mg by mouth daily with breakfast. ) 60 tablet 0   . levETIRAcetam (KEPPRA) 1000 MG tablet Take 1 tablet (1,000 mg total) by mouth 2 (two) times daily. 60 tablet 11  . phenytoin (DILANTIN) 100 MG ER capsule Take 1 capsule (100 mg total) by mouth 2 (two) times daily. 60 capsule 11  . polyethylene glycol (MIRALAX / GLYCOLAX) packet Take 17 g by mouth daily as needed. (Patient taking differently: Take 17 g by mouth daily as needed for moderate constipation. ) 14 each 0  . simvastatin (ZOCOR) 20 MG tablet TAKE 1 TABLET BY MOUTH ONCE DAILY AT 6PM (Patient taking differently: Take 20 mg by mouth daily at 6 PM. ) 90 tablet 1  . tamsulosin (FLOMAX) 0.4 MG CAPS capsule Take 1 capsule by mouth once daily (Patient taking differently: Take 0.4 mg by mouth daily. ) 90 capsule 0  . traMADol (ULTRAM) 50 MG tablet Take 1 tablet (50 mg total) by mouth every 6 (six) hours as needed for moderate pain. for pain (Patient taking differently: Take 50 mg by mouth every 6 (six) hours as needed for moderate pain. ) 10 tablet 0   No current facility-administered medications for this visit.      REVIEW OF SYSTEMS:   A 10+ POINT REVIEW OF SYSTEMS WAS OBTAINED including neurology, dermatology, psychiatry, cardiac, respiratory, lymph, extremities, GI, GU, Musculoskeletal, constitutional, breasts, reproductive, HEENT.  All pertinent positives are noted in the HPI.  All others are negative.    PHYSICAL EXAMINATION: ECOG PERFORMANCE STATUS: 3 - Symptomatic, >50% confined to bed  Vitals:   09/24/19 1015  BP: 102/72  Pulse: 69  Resp: 17  Temp: 98 F (36.7 C)  SpO2: 99%   Filed Weights   09/24/19 1015  Weight: 190 lb 12.8 oz (86.5 kg)   Body mass index is 26.61 kg/m.  GENERAL:alert, in no acute distress and comfortable SKIN: no acute rashes, no significant lesions EYES: conjunctiva are pink and non-injected, sclera anicteric OROPHARYNX: MMM, no exudates, no oropharyngeal erythema or ulceration NECK: supple, no JVD LYMPH:  no palpable lymphadenopathy in the  cervical, axillary or inguinal regions LUNGS:  clear to auscultation b/l with normal respiratory effort HEART: regular rate & rhythm ABDOMEN:  normoactive bowel sounds , non tender, not distended. Extremity: no pedal edema PSYCH: alert & oriented x 3 with fluent speech NEURO: no focal motor/sensory deficits   LABORATORY DATA:  I have reviewed the data as listed  CBC Latest Ref Rng & Units 09/24/2019 09/18/2019 09/17/2019  WBC 4.0 - 10.5 K/uL 4.2 4.8 4.9  Hemoglobin 13.0 - 17.0 g/dL 12.0(L) 11.5(L) 11.1(L)  Hematocrit 39.0 - 52.0 % 39.5 36.7(L) 35.5(L)  Platelets 150 - 400 K/uL 320 208 253    CMP Latest Ref Rng & Units 09/24/2019 09/18/2019 09/10/2019  Glucose 70 - 99 mg/dL 90 84 65(L)  BUN 8 - 23 mg/dL 22 17 16   Creatinine 0.61 - 1.24 mg/dL 1.12 0.95 1.13  Sodium 135 - 145 mmol/L 140 137 136  Potassium 3.5 - 5.1 mmol/L 4.8 4.4 4.2  Chloride 98 - 111 mmol/L 110 108 106  CO2 22 - 32 mmol/L 25 23 23   Calcium 8.9 - 10.3 mg/dL 9.7 8.5(L) 9.0  Total Protein 6.5 - 8.1 g/dL 9.1(H) - -  Total Bilirubin 0.3 - 1.2 mg/dL 0.4 - -  Alkaline Phos 38 - 126 U/L 115 - -  AST 15 - 41 U/L 24 - -  ALT 0 - 44 U/L 17 - -   Component     Latest Ref Rng & Units 04/17/2017 11/27/2017 07/22/2019 07/29/2019  IgG (Immunoglobin G), Serum     603 - 1,613 mg/dL    2,155 (H)  IgA     61 - 437 mg/dL    320  IgM (Immunoglobulin M), Srm     20 - 172 mg/dL    10 (L)  Total Protein ELP     6.0 - 8.5 g/dL    6.0  Albumin SerPl Elph-Mcnc     2.9 - 4.4 g/dL    2.5 (L)  Alpha 1     0.0 - 0.4 g/dL    0.2  Alpha2 Glob SerPl Elph-Mcnc     0.4 - 1.0 g/dL    0.5  B-Globulin SerPl Elph-Mcnc     0.7 - 1.3 g/dL    0.5 (L)  Gamma Glob SerPl Elph-Mcnc     0.4 - 1.8 g/dL    2.2 (H)  M Protein SerPl Elph-Mcnc     Not Observed g/dL    1.6 (H)  Globulin, Total     2.2 - 3.9 g/dL    3.5  Albumin/Glob SerPl     0.7 - 1.7    0.8  IFE 1         Comment  Please Note (HCV):         Comment  Anticardiolipin Ab,IgA,Qn     0 -  11 APL U/mL      Anticardiolipin Ab,IgG,Qn     0 - 14 GPL U/mL      Anticardiolipin Ab,IgM,Qn     0 - 12 MPL U/mL      PTT Lupus Anticoagulant     0.0 - 51.9 sec      DRVVT     0.0 - 47.0 sec      Phosphatydalserine, IgG     0 - 11 GPS IgG      Phosphatydalserine, IgM     0 - 25 MPS IgM      Phosphatydalserine, IgA     0 - 20 APS IgA      Lupus Anticoag Interp  APTT     22.9 - 30.2 sec      aPTT 1:1 Normal Plasma     22.9 - 30.2 sec      aPTT 1:1 Mix Saline           aPTT 1:1 NP Mix, 60 Min,Incub.     22.9 - 30.2 sec      aPTT 1:1 NP Incub. Mix Ctl     22.9 - 30.2 sec      Coagulation Factor VIII     56 - 140 %    182 (H)  Ristocetin Co-factor, Plasma     50 - 200 %    91  Von Willebrand Antigen, Plasma     50 - 200 %    117  PT     9.6 - 11.5 sec      PT 1:1NP     9.6 - 11.5 sec      1 HR INCUB PT 1:1NP     9.6 - 11.5 sec      PFA Interpretation           Collagen / Epinephrine     0 - 193 seconds    132  Ferritin     24 - 336 ng/mL 13.0 (L)   28  Vitamin B12     211 - 911 pg/mL  321    Factor VII Activity     51 - 186 %   69   Factor XIII, Qualitative     No Lysis/24    NORMAL  Von Willebrand Multimers         Comment  Factor X Activity     76 - 183 %      Factor V Activity     70 - 150 %      Factor II Activity     50 - 154 %      THROMBIN TIME     0.0 - 23.0 sec      Interpretation         Note  Sed Rate     0 - 16 mm/hr       Component     Latest Ref Rng & Units 07/31/2019 09/24/2019  IgG (Immunoglobin G), Serum     603 - 1,613 mg/dL    IgA     61 - 437 mg/dL    IgM (Immunoglobulin M), Srm     20 - 172 mg/dL    Total Protein ELP     6.0 - 8.5 g/dL    Albumin SerPl Elph-Mcnc     2.9 - 4.4 g/dL    Alpha 1     0.0 - 0.4 g/dL    Alpha2 Glob SerPl Elph-Mcnc     0.4 - 1.0 g/dL    B-Globulin SerPl Elph-Mcnc     0.7 - 1.3 g/dL    Gamma Glob SerPl Elph-Mcnc     0.4 - 1.8 g/dL    M Protein SerPl Elph-Mcnc     Not Observed g/dL     Globulin, Total     2.2 - 3.9 g/dL    Albumin/Glob SerPl     0.7 - 1.7    IFE 1         Please Note (HCV):         Anticardiolipin Ab,IgA,Qn     0 - 11 APL U/mL <9   Anticardiolipin Ab,IgG,Qn     0 - 14 GPL U/mL <9  Anticardiolipin Ab,IgM,Qn     0 - 12 MPL U/mL <9   PTT Lupus Anticoagulant     0.0 - 51.9 sec 64.2 (H)   DRVVT     0.0 - 47.0 sec 41.0   Phosphatydalserine, IgG     0 - 11 GPS IgG 3   Phosphatydalserine, IgM     0 - 25 MPS IgM 2   Phosphatydalserine, IgA     0 - 20 APS IgA 2   Lupus Anticoag Interp      Comment:   APTT     22.9 - 30.2 sec 35.8 (H)   aPTT 1:1 Normal Plasma     22.9 - 30.2 sec 32.4 (H)   aPTT 1:1 Mix Saline      NOT PERFORMED   aPTT 1:1 NP Mix, 60 Min,Incub.     22.9 - 30.2 sec 35.0 (H)   aPTT 1:1 NP Incub. Mix Ctl     22.9 - 30.2 sec 33.0 (H)   Coagulation Factor VIII     56 - 140 %    Ristocetin Co-factor, Plasma     50 - 200 %    Von Willebrand Antigen, Plasma     50 - 200 %    PT     9.6 - 11.5 sec 12.5 (H)   PT 1:1NP     9.6 - 11.5 sec 10.9   1 HR INCUB PT 1:1NP     9.6 - 11.5 sec 11.4   PFA Interpretation         Collagen / Epinephrine     0 - 193 seconds    Ferritin     24 - 336 ng/mL  122  Vitamin B12     211 - 911 pg/mL    Factor VII Activity     51 - 186 %    Factor XIII, Qualitative     No Lysis/24    Von Willebrand Multimers         Factor X Activity     76 - 183 % 95   Factor V Activity     70 - 150 % 64 (L)   Factor II Activity     50 - 154 % 82   THROMBIN TIME     0.0 - 23.0 sec 17.8   Interpretation         Sed Rate     0 - 16 mm/hr  16    RADIOGRAPHIC STUDIES: I have personally reviewed the radiological images as listed and agreed with the findings in the report. No results found.   ASSESSMENT & PLAN:  Louis Ford. is a 73 y.o. male with multiple medical co-morbidities included afib, severe systolic CHF, CVA with ?? Mid factor VII deficiency with previous post-op bleeding issues with  pacemaker placement, TEE. No issues with spontaneous bleeding at baseline.[name]  1) Post operative dental/gum bleeding requiring transfusions and rpt Alveoloplasty and thrombin soaked gel foam application on .  No clinically significant bleeding noted at this time. hgb has now normalized Drop in hgb to 6.8 previously likely reflecitng operative blood loss.  2) ? Factor VII deficiency - vs coumadin effect in past.  3) PPM pocket hematoma FVII levels 69% which is WNL Platelet function assay - WNL -rpt PT- slight elevation, aPTT- elevated -PT and PTT mixing studies though previously no concern with factor inhibitor. -,thrombin time, VWD panel, Factor XIII, factor 2, and 10 -- wnl -normal Thromboin time suggests against pararpoteinemia related  coaguloapathy -factor V levels slightly low --concerning for liver disease. -some concern for possible CPVC of liver and possible related coaguloapthy causing mild elevation of PT and aPTT--- would support vit K dep clotting factors with vit K 35m po daily for 10-14 days. -also possibility of dysfibrinogenemia -not on anticoagulation for afib- due to significant bleeding issues -no indication for rFVIIa or [facility] at this time which is his case is not clearly indicated and would be high risk for thrombosis.   3) Iron deficiency -- received IV Iron  . No results found for: IRON, TIBC, IRONPCTSAT (Iron and TIBC)  Lab Results  Component Value Date   FERRITIN 122 09/24/2019  -no indication for additional IV Iron at this time.  PLAN: -Discussed patient's most recent labs from 09/24/19 , all values are WNL except for Hemoglobin at 12, RDW at 16, Total Protein at 9.1, Albumin at 3.3, aPTT at 42, Prothroin time at 15.7, and INR at 1.3.  -Factor seven levels are normal therefore factor VII deficiency not the issue -Bleeding caused by liver disease (low factor V levels , ? Dysfibrinogenemia) vs from his monoclonal paraproteinemia (though TT WNL)  [facility] -Discussed current medications. Pt advised that he is taking cymbalta for depression. Discussed that cymbalta can affect platelet function and to follow up with PCP to see if medication can be altered.  -[facility]  4) IgG Kappa monoclonal paraproteinemia PLAN  Advised of the option to take a conservative approach to monitor protein levels to insure that he does not have multiple myeloma.   FOLLOW UP -[facility] -RTC with Dr KIrene Limbowith labs in 3 months. Plz schedule labs 1 week prior to clinic visit  All of the patients questions were answered with apparent satisfaction. The patient knows to call the clinic with any problems, questions or concerns.  I spent 35 minutes counseling the patient face to face. The total time spent in the appointment was 40 minutes and more than 50% was on counseling and direct patient cares.    GSullivan LoneMD MS AAHIVMS SSepulveda Ambulatory Care CenterCTexas Eye Surgery Center LLCHematology/Oncology Physician [facility] (Office):       [phone](Work cell):  [phone](Fax):           [phone] 09/24/2019 2:21 AM  I, AJacqualyn Posey am acting as a sEducation administratorfor Dr. GSullivan Lone   .I have reviewed the above documentation for accuracy and completeness, and I agree with the above. .Brunetta GeneraMD

## 2019-09-25 ENCOUNTER — Telehealth: Payer: Self-pay | Admitting: Hematology

## 2019-09-25 ENCOUNTER — Telehealth: Payer: Self-pay | Admitting: *Deleted

## 2019-09-25 DIAGNOSIS — I951 Orthostatic hypotension: Secondary | ICD-10-CM | POA: Diagnosis not present

## 2019-09-25 DIAGNOSIS — I5022 Chronic systolic (congestive) heart failure: Secondary | ICD-10-CM | POA: Diagnosis not present

## 2019-09-25 DIAGNOSIS — K5900 Constipation, unspecified: Secondary | ICD-10-CM | POA: Diagnosis not present

## 2019-09-25 DIAGNOSIS — G4700 Insomnia, unspecified: Secondary | ICD-10-CM | POA: Diagnosis not present

## 2019-09-25 DIAGNOSIS — T82837D Hemorrhage of cardiac prosthetic devices, implants and grafts, subsequent encounter: Secondary | ICD-10-CM | POA: Diagnosis not present

## 2019-09-25 DIAGNOSIS — Z9181 History of falling: Secondary | ICD-10-CM | POA: Diagnosis not present

## 2019-09-25 DIAGNOSIS — Z8673 Personal history of transient ischemic attack (TIA), and cerebral infarction without residual deficits: Secondary | ICD-10-CM | POA: Diagnosis not present

## 2019-09-25 DIAGNOSIS — I130 Hypertensive heart and chronic kidney disease with heart failure and stage 1 through stage 4 chronic kidney disease, or unspecified chronic kidney disease: Secondary | ICD-10-CM | POA: Diagnosis not present

## 2019-09-25 DIAGNOSIS — N183 Chronic kidney disease, stage 3 unspecified: Secondary | ICD-10-CM | POA: Diagnosis not present

## 2019-09-25 DIAGNOSIS — M1A9XX0 Chronic gout, unspecified, without tophus (tophi): Secondary | ICD-10-CM | POA: Diagnosis not present

## 2019-09-25 DIAGNOSIS — I255 Ischemic cardiomyopathy: Secondary | ICD-10-CM | POA: Diagnosis not present

## 2019-09-25 DIAGNOSIS — I2510 Atherosclerotic heart disease of native coronary artery without angina pectoris: Secondary | ICD-10-CM | POA: Diagnosis not present

## 2019-09-25 DIAGNOSIS — I4819 Other persistent atrial fibrillation: Secondary | ICD-10-CM | POA: Diagnosis not present

## 2019-09-25 DIAGNOSIS — Z95810 Presence of automatic (implantable) cardiac defibrillator: Secondary | ICD-10-CM | POA: Diagnosis not present

## 2019-09-25 NOTE — Telephone Encounter (Signed)
Dr.Kale referring this patient to [facility] for management of Complex Coagulopathy to see either Dr. Gita Kudo, [name], or Dr. Hulan Amato.  I contacted office 09/25/2019, Ph# [phone], spoke with Chantel and she requested the following be faxed to Fax # [phone]: -Demographics -Insurance information -Dr. Grier Mitts OV notes -Labs (req as far back as 3 yrs, Dr.Kale has only seen in clinic one time)   Records request and information sent to [facility] Records.

## 2019-09-25 NOTE — Telephone Encounter (Signed)
Scheduled appt per 10/1 los.  Sent a staff message and a calendar will be mailed out.

## 2019-09-28 ENCOUNTER — Ambulatory Visit (INDEPENDENT_AMBULATORY_CARE_PROVIDER_SITE_OTHER): Payer: Medicare Other | Admitting: *Deleted

## 2019-09-28 ENCOUNTER — Encounter: Payer: Self-pay | Admitting: Internal Medicine

## 2019-09-28 ENCOUNTER — Inpatient Hospital Stay: Payer: Medicare Other | Admitting: Internal Medicine

## 2019-09-28 ENCOUNTER — Other Ambulatory Visit: Payer: Self-pay

## 2019-09-28 ENCOUNTER — Ambulatory Visit (INDEPENDENT_AMBULATORY_CARE_PROVIDER_SITE_OTHER): Payer: Medicare Other | Admitting: Internal Medicine

## 2019-09-28 DIAGNOSIS — Z95810 Presence of automatic (implantable) cardiac defibrillator: Secondary | ICD-10-CM

## 2019-09-28 DIAGNOSIS — D689 Coagulation defect, unspecified: Secondary | ICD-10-CM | POA: Diagnosis not present

## 2019-09-28 DIAGNOSIS — T82837A Hemorrhage of cardiac prosthetic devices, implants and grafts, initial encounter: Secondary | ICD-10-CM

## 2019-09-28 DIAGNOSIS — G4700 Insomnia, unspecified: Secondary | ICD-10-CM

## 2019-09-28 DIAGNOSIS — I255 Ischemic cardiomyopathy: Secondary | ICD-10-CM

## 2019-09-28 DIAGNOSIS — D62 Acute posthemorrhagic anemia: Secondary | ICD-10-CM

## 2019-09-28 NOTE — Assessment & Plan Note (Signed)
Improved on recent CBC Hg is 12.

## 2019-09-28 NOTE — Assessment & Plan Note (Signed)
Note from hematology not reviewed as not done. They appear to have referred to [facility] to help evaluate if he has a bleeding problem.

## 2019-09-28 NOTE — Assessment & Plan Note (Signed)
Cymbalta has been helping but he may want to stop. Given instruction for every other day 1-2 weeks then stop. Taking 30 mg cymbalta daily currently.

## 2019-09-28 NOTE — Assessment & Plan Note (Signed)
Seeing cardiology later this morning for examination and on prior exam there was no oozing. Not examined during video visit.

## 2019-09-28 NOTE — Progress Notes (Signed)
Wound check appointment. Pressure dressing and steri-strips removed. Large, firm hematoma still present. Left lateral incision edges fully approximated, right lateral incision edges unapproximated in some areas, sanguineous oozing noted. Cleansed site with saline. Discussed with Dr. Lovena Le via phone--plan to reapply pressure dressing and see patient back on 10/01/19 while Dr. Lovena Le is in office. Abrasion noted at right collarbone, antibiotic ointment and gauze applied prior to pressure dressing application. Patient aware to continue cephalexin.  Normal device function. Thresholds, sensing, and impedances consistent with implant measurements. RV capture control programmed on. Histogram distribution appropriate for patient and level of activity. No mode switches or ventricular arrhythmias noted. Patient educated about wound care, arm mobility, lifting restrictions, shock plan. ROV with Dr. Lovena Le on 12/15/19.  Patient gave verbal permission to include the following photo:

## 2019-09-28 NOTE — Progress Notes (Signed)
Virtual Visit via Video Note  I connected with [facility]. on 09/28/19 at  9:20 AM EDT by a video enabled telemedicine application and verified that I am speaking with the correct person using two identifiers.  The patient and the provider were at separate locations throughout the entire encounter.   I discussed the limitations of evaluation and management by telemedicine and the availability of in person appointments. The patient expressed understanding and agreed to proceed.  History of Present Illness: The patient is a 73 y.o. man with visit for follow up rehab. Was there after bleeding episode with tooth removal. Now having problems with bleeding around ICD. He was just admitted for packing and observation and now is not having bleeding or oozing. Has been doing okay overall. Saw hematology (note not finished) and they are being referred to [facility] hematology. Denies SOB or chest pains. Denies abdominal pain or diarrhea or constipation. There was some suggestion that cymbalta could be causing some bleeding problems and they want to know if he could think about stopping or how they would do this. Overall it is stable.   PMH, [place], social history reviewed and updated  Observations/Objective: Appearance: normal, breathing appears normal, casual grooming, abdomen does not appear distended, throat not examined due to poor video quality, memory normal, mental status is A and O times 3  Assessment and Plan: See problem oriented charting  Follow Up Instructions: given instruction on stopping cymbalta if desired, they will see [place] [facility] for assessment.  I discussed the assessment and treatment plan with the patient. The patient was provided an opportunity to ask questions and all were answered. The patient agreed with the plan and demonstrated an understanding of the instructions.   The patient was advised to call back or seek an in-person evaluation if the symptoms worsen or if the  condition fails to improve as anticipated.  [name], MD

## 2019-09-30 DIAGNOSIS — Z9181 History of falling: Secondary | ICD-10-CM | POA: Diagnosis not present

## 2019-09-30 DIAGNOSIS — G4700 Insomnia, unspecified: Secondary | ICD-10-CM | POA: Diagnosis not present

## 2019-09-30 DIAGNOSIS — I255 Ischemic cardiomyopathy: Secondary | ICD-10-CM | POA: Diagnosis not present

## 2019-09-30 DIAGNOSIS — I5022 Chronic systolic (congestive) heart failure: Secondary | ICD-10-CM | POA: Diagnosis not present

## 2019-09-30 DIAGNOSIS — I2510 Atherosclerotic heart disease of native coronary artery without angina pectoris: Secondary | ICD-10-CM | POA: Diagnosis not present

## 2019-09-30 DIAGNOSIS — T82837D Hemorrhage of cardiac prosthetic devices, implants and grafts, subsequent encounter: Secondary | ICD-10-CM | POA: Diagnosis not present

## 2019-09-30 DIAGNOSIS — M1A9XX0 Chronic gout, unspecified, without tophus (tophi): Secondary | ICD-10-CM | POA: Diagnosis not present

## 2019-09-30 DIAGNOSIS — K5900 Constipation, unspecified: Secondary | ICD-10-CM | POA: Diagnosis not present

## 2019-09-30 DIAGNOSIS — I130 Hypertensive heart and chronic kidney disease with heart failure and stage 1 through stage 4 chronic kidney disease, or unspecified chronic kidney disease: Secondary | ICD-10-CM | POA: Diagnosis not present

## 2019-09-30 DIAGNOSIS — I951 Orthostatic hypotension: Secondary | ICD-10-CM | POA: Diagnosis not present

## 2019-09-30 DIAGNOSIS — Z8673 Personal history of transient ischemic attack (TIA), and cerebral infarction without residual deficits: Secondary | ICD-10-CM | POA: Diagnosis not present

## 2019-09-30 DIAGNOSIS — Z95810 Presence of automatic (implantable) cardiac defibrillator: Secondary | ICD-10-CM | POA: Diagnosis not present

## 2019-09-30 DIAGNOSIS — N183 Chronic kidney disease, stage 3 unspecified: Secondary | ICD-10-CM | POA: Diagnosis not present

## 2019-09-30 DIAGNOSIS — I4819 Other persistent atrial fibrillation: Secondary | ICD-10-CM | POA: Diagnosis not present

## 2019-09-30 LAB — CUP PACEART INCLINIC DEVICE CHECK
Battery Voltage: 3.12 V
Brady Statistic RV Percent Paced: 0 %
Date Time Interrogation Session: 20201005151300
HighPow Impedance: 71 Ohm
Implantable Lead Implant Date: 20120719
Implantable Lead Location: 753860
Implantable Lead Model: 375
Implantable Lead Serial Number: 10465806
Implantable Pulse Generator Implant Date: 20200917
Lead Channel Impedance Value: 461 Ohm
Lead Channel Pacing Threshold Amplitude: 0.7 V
Lead Channel Pacing Threshold Pulse Width: 0.4 ms
Lead Channel Sensing Intrinsic Amplitude: 15.3 mV
Lead Channel Setting Pacing Amplitude: 1.6 V
Lead Channel Setting Pacing Pulse Width: 0.4 ms
Lead Channel Setting Sensing Sensitivity: 0.8 mV
Pulse Gen Model: 404624
Pulse Gen Serial Number: 84733802

## 2019-10-01 ENCOUNTER — Encounter: Payer: Self-pay | Admitting: Cardiology

## 2019-10-01 ENCOUNTER — Other Ambulatory Visit: Payer: Self-pay

## 2019-10-01 ENCOUNTER — Ambulatory Visit: Payer: Medicare Other | Admitting: Internal Medicine

## 2019-10-01 ENCOUNTER — Encounter: Payer: Self-pay | Admitting: Internal Medicine

## 2019-10-01 VITALS — BP 118/70 | HR 74 | Ht 71.0 in | Wt 197.0 lb

## 2019-10-01 DIAGNOSIS — I5022 Chronic systolic (congestive) heart failure: Secondary | ICD-10-CM

## 2019-10-01 DIAGNOSIS — T82837D Hemorrhage of cardiac prosthetic devices, implants and grafts, subsequent encounter: Secondary | ICD-10-CM

## 2019-10-01 NOTE — Patient Instructions (Addendum)
Medication Instructions:  Your physician recommends that you continue on your current medications as directed. Please refer to the Current Medication list given to you today.  Labwork: None ordered.  Testing/Procedures: None ordered.  Follow-Up:  Next Thursday October 08, 2019 at 3:30 pm  Any Other Special Instructions Will Be Listed Below (If Applicable).  If you need a refill on your cardiac medications before your next appointment, please call your pharmacy.

## 2019-10-01 NOTE — Progress Notes (Signed)
HPI Mr. Louis Ford returns today for followup of his pocket hematoma. He has developed a coaguloathy over time and had a large hematoma after his ICD generator change out. I contacted his hematologist who recommended no IV supplemental treatment and he had a recurrent hematoma after his pocket evacuation for which he has been treated with pressure. So far his most recent bandage appears to be intact. He has not had more overt bleeding. No Known Allergies   Current Outpatient Medications  Medication Sig Dispense Refill  . acetaminophen (TYLENOL) 325 MG tablet Take 650 mg by mouth every 6 (six) hours as needed for mild pain.    [name] aminocaproic acid (AMICAR) 5 % SOLN Take 10 mLs by mouth every hour as needed (for persistent oozing).    [name] amiodarone (PACERONE) 200 MG tablet Take 1 tablet by mouth once daily (Patient taking differently: Take 200 mg by mouth daily. ) 30 tablet 0  . carvedilol (COREG) 3.125 MG tablet Take 1 tablet (3.125 mg total) by mouth 2 (two) times daily with a meal. 60 tablet 1  . cephALEXin (KEFLEX) 500 MG capsule Take 1 capsule (500 mg total) by mouth 2 (two) times daily. 14 capsule 0  . divalproex (DEPAKOTE ER) 500 MG 24 hr tablet Take 1 tablet every night (Patient taking differently: Take 500 mg by mouth at bedtime. ) 30 tablet 11  . DULoxetine (CYMBALTA) 30 MG capsule Take 1 capsule (30 mg total) by mouth daily. 90 capsule 1  . feeding supplement, ENSURE ENLIVE, (ENSURE ENLIVE) LIQD Take 237 mLs by mouth 3 (three) times daily between meals. 237 mL 12  . levETIRAcetam (KEPPRA) 1000 MG tablet Take 1 tablet (1,000 mg total) by mouth 2 (two) times daily. 60 tablet 11  . phenytoin (DILANTIN) 100 MG ER capsule Take 1 capsule (100 mg total) by mouth 2 (two) times daily. 60 capsule 11  . polyethylene glycol (MIRALAX / GLYCOLAX) packet Take 17 g by mouth daily as needed. (Patient taking differently: Take 17 g by mouth daily as needed for moderate constipation. ) 14 each 0  .  simvastatin (ZOCOR) 20 MG tablet TAKE 1 TABLET BY MOUTH ONCE DAILY AT 6PM (Patient taking differently: Take 20 mg by mouth daily at 6 PM. ) 90 tablet 1  . tamsulosin (FLOMAX) 0.4 MG CAPS capsule Take 1 capsule by mouth once daily (Patient taking differently: Take 0.4 mg by mouth daily. ) 90 capsule 0  . traMADol (ULTRAM) 50 MG tablet Take 1 tablet (50 mg total) by mouth every 6 (six) hours as needed for moderate pain. for pain (Patient taking differently: Take 50 mg by mouth every 6 (six) hours as needed for moderate pain. ) 10 tablet 0  . ferrous sulfate 325 (65 FE) MG tablet Take 1 tablet (325 mg total) by mouth 2 (two) times daily with a meal. (Patient taking differently: Take 325 mg by mouth daily with breakfast. ) 60 tablet 0   No current facility-administered medications for this visit.      Past Medical History:  Diagnosis Date  . AICD (automatic cardioverter/defibrillator) present   . Atrial fibrillation   09/22/2012  . Blood loss anemia 04/18/2017   After GI bleed from colonoscopy and polypectomy  . CAD (coronary artery disease)   . Chronic systolic heart failure ([place])   . 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 GIB (gastrointestinal bleeding) 02/20/2017  . History of MRSA infection 05/2011  .  HTN (hypertension)   . Hx of adenomatous colonic polyps 02/20/2017   01/2017 - 3 cm sigmoid TV adenoma and other smaller polyps - had post-polypectomy bleed Tx w/ clips Consider repeat colonoscopy 3 yrs [name], MD, [name]   . Hyperlipidemia   . implantable cardiac defibrillator-Biotronik    Device Implanted 2006; s/p gen change 03/2011 : bleeding persistent with pocket erosion and infection; explant and reimplant  06/2011  . Ischemic cardiomyopathy    EF 15 to 20% by TTE and TEE in 09/2012.  Severe LV dysfunction  . Obesity    BMI 31 in 09/2012  . Persistent atrial fibrillation with rapid ventricular response ([place]) 04/21/2014  . Retroperitoneal  bleed 04/21/2019  . Seizure disorder ([place]) latest 09/30/2012  . Seizures ([place])   . Stroke ([place])     ROS:   All systems reviewed and negative except as noted in the HPI.   Past Surgical History:  Procedure Laterality Date  . ALVEOLOPLASTY N/A 07/26/2019   Procedure: ALVEOLOPLASTY WITH RESUTURING OF ORAL WOUND;  Surgeon: [name], [name];  [facility];  Service: Oral Surgery;  Laterality: N/A;  . CARDIAC CATHETERIZATION N/A 10/12/2015   Procedure: Right Heart Cath;  Surgeon: [name], MD;  Location: [facility];  Service: Cardiovascular;  Laterality: N/A;  . CARDIOVERSION  09/24/2012   Procedure: CARDIOVERSION;  Surgeon: [name], MD;  Location: [facility] ENDOSCOPY;  Service: Cardiovascular;  Laterality: N/A;  . CARDIOVERSION N/A 12/25/2017   Procedure: CARDIOVERSION;  Surgeon: [name], MD;  [facility];  Service: Cardiovascular;  Laterality: N/A;  . COLONOSCOPY W/ POLYPECTOMY  02/13/2017  . CORONARY ARTERY BYPASS GRAFT  2011   in [place] N/A 08/12/2019   Procedure: CAUTERIZATION OF ORAL BLEEDING;  Surgeon: [name], [name];  [facility];  Service: Oral Surgery;  Laterality: N/A;  . FLEXIBLE SIGMOIDOSCOPY N/A 02/20/2017   Procedure: FLEXIBLE SIGMOIDOSCOPY;  Surgeon: [name], MD;  Location: [facility] ENDOSCOPY;  Service: Endoscopy;  Laterality: N/A;  . FLEXIBLE SIGMOIDOSCOPY N/A 02/21/2017   Procedure: FLEXIBLE SIGMOIDOSCOPY;  Surgeon: [name], MD;  Location: [facility] ENDOSCOPY;  Service: Endoscopy;  Laterality: N/A;  . ICD    . ICD GENERATOR CHANGEOUT N/A 09/10/2019   Procedure: August;  Surgeon: [name], MD;  Location: [place] CV LAB;  Service: Cardiovascular;  Laterality: N/A;  . POCKET REVISION/RELOCATION N/A 09/17/2019   Procedure: POCKET REVISION/RELOCATION;  Surgeon: Thompson Grayer, MD;  Location: [place] [facility];  Service: Cardiovascular;  Laterality: N/A;  . SUBMANDIBULAR GLAND EXCISION N/A 08/08/2019    Procedure: suture of oral wounds;  Surgeon: [name], [name];  [facility];  Service: Oral Surgery;  Laterality: N/A;  . TEE WITHOUT CARDIOVERSION  09/24/2012   Procedure: TRANSESOPHAGEAL ECHOCARDIOGRAM (TEE);  Surgeon: [name], MD;  Location: [facility] ENDOSCOPY;  Service: Cardiovascular;  Laterality: N/A;  dave/anesth, dl, cindy/echo      Family History  Problem Relation Age of Onset  . Arthritis Mother   . Heart disease Mother   . Heart attack Mother   . Other Father        smoker  . Hypertension Neg Hx        unknown  . Stroke Neg Hx        unknown     Social History   Socioeconomic History  . Marital status: Married    Spouse name: Not on file  . Number of  children: Not on file  . Years of education: Not on file  . Highest education level: Not on file  Occupational History  . Not on file  Social Needs  . Financial resource strain: Not on file  . Food insecurity    Worry: Not on file    Inability: Not on file  . Transportation needs    Medical: Not on file    Non-medical: Not on file  Tobacco Use  . Smoking status: Former Smoker    Types: Pipe    Quit date: 09/21/2008    Years since quitting: 11.0  . Smokeless tobacco: Former Systems developer    Quit date: 09/21/2008  Substance and Sexual Activity  . Alcohol use: No  . Drug use: No  . Sexual activity: Not Currently    Birth control/protection: None  Lifestyle  . Physical activity    Days per week: Not on file    Minutes per session: Not on file  . Stress: Not on file  Relationships  . Social Herbalist on phone: Not on file    Gets together: Not on file    Attends religious service: Not on file    Active member of club or organization: Not on file    Attends meetings of clubs or organizations: Not on file    Relationship status: Not on file  . Intimate partner violence    Fear of current or ex partner: Not on file    Emotionally abused: Not on file    Physically abused: Not on file    Forced  sexual activity: Not on file  Other Topics Concern  . Not on file  Social History Narrative   ** Merged History Encounter **         BP 118/70   Pulse 74   Ht 5\' 11"  (1.803 m)   Wt 197 lb (89.4 kg)   SpO2 99%   BMI 27.48 kg/m   Physical Exam:  Well appearing NAD HEENT: Unremarkable Neck:  No JVD, no thyromegally Lymphatics:  No adenopathy Back:  No CVA tenderness Lungs:  Clear with no wheezes. A bandage is present over the right chest which is clean and dry. HEART:  Regular rate rhythm, no murmurs, no rubs, no clicks Abd:  soft, positive bowel sounds, no organomegally, no rebound, no guarding Ext:  2 plus pulses, no edema, no cyanosis, no clubbing Skin:  No rashes no nodules Neuro:  CN II through XII intact, motor grossly intact  DEVICE  Normal device function.  See PaceArt for details.   Assess/Plan: 1. 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 He has not had active bleeding. His bandage was left on.  2. Chronic systolic heart failure - he is not taking lasix execpt as needed. I asked his wife to keep an eye on his legs and if he has sob or leg swelling to give him lasix. 3. ICD - his device interrogation will take place in several weeks.   Mikle Bosworth.D.

## 2019-10-02 DIAGNOSIS — N183 Chronic kidney disease, stage 3 unspecified: Secondary | ICD-10-CM | POA: Diagnosis not present

## 2019-10-02 DIAGNOSIS — K5900 Constipation, unspecified: Secondary | ICD-10-CM | POA: Diagnosis not present

## 2019-10-02 DIAGNOSIS — Z8673 Personal history of transient ischemic attack (TIA), and cerebral infarction without residual deficits: Secondary | ICD-10-CM | POA: Diagnosis not present

## 2019-10-02 DIAGNOSIS — I951 Orthostatic hypotension: Secondary | ICD-10-CM | POA: Diagnosis not present

## 2019-10-02 DIAGNOSIS — I4819 Other persistent atrial fibrillation: Secondary | ICD-10-CM | POA: Diagnosis not present

## 2019-10-02 DIAGNOSIS — I5022 Chronic systolic (congestive) heart failure: Secondary | ICD-10-CM | POA: Diagnosis not present

## 2019-10-02 DIAGNOSIS — T82837D Hemorrhage of cardiac prosthetic devices, implants and grafts, subsequent encounter: Secondary | ICD-10-CM | POA: Diagnosis not present

## 2019-10-02 DIAGNOSIS — Z95810 Presence of automatic (implantable) cardiac defibrillator: Secondary | ICD-10-CM | POA: Diagnosis not present

## 2019-10-02 DIAGNOSIS — M1A9XX0 Chronic gout, unspecified, without tophus (tophi): Secondary | ICD-10-CM | POA: Diagnosis not present

## 2019-10-02 DIAGNOSIS — I130 Hypertensive heart and chronic kidney disease with heart failure and stage 1 through stage 4 chronic kidney disease, or unspecified chronic kidney disease: Secondary | ICD-10-CM | POA: Diagnosis not present

## 2019-10-02 DIAGNOSIS — Z9181 History of falling: Secondary | ICD-10-CM | POA: Diagnosis not present

## 2019-10-02 DIAGNOSIS — G4700 Insomnia, unspecified: Secondary | ICD-10-CM | POA: Diagnosis not present

## 2019-10-02 DIAGNOSIS — I255 Ischemic cardiomyopathy: Secondary | ICD-10-CM | POA: Diagnosis not present

## 2019-10-02 DIAGNOSIS — I2510 Atherosclerotic heart disease of native coronary artery without angina pectoris: Secondary | ICD-10-CM | POA: Diagnosis not present

## 2019-10-05 DIAGNOSIS — I4819 Other persistent atrial fibrillation: Secondary | ICD-10-CM | POA: Diagnosis not present

## 2019-10-05 DIAGNOSIS — K5900 Constipation, unspecified: Secondary | ICD-10-CM | POA: Diagnosis not present

## 2019-10-05 DIAGNOSIS — I2510 Atherosclerotic heart disease of native coronary artery without angina pectoris: Secondary | ICD-10-CM | POA: Diagnosis not present

## 2019-10-05 DIAGNOSIS — T82837D Hemorrhage of cardiac prosthetic devices, implants and grafts, subsequent encounter: Secondary | ICD-10-CM | POA: Diagnosis not present

## 2019-10-05 DIAGNOSIS — I5022 Chronic systolic (congestive) heart failure: Secondary | ICD-10-CM | POA: Diagnosis not present

## 2019-10-05 DIAGNOSIS — G4700 Insomnia, unspecified: Secondary | ICD-10-CM | POA: Diagnosis not present

## 2019-10-05 DIAGNOSIS — N183 Chronic kidney disease, stage 3 unspecified: Secondary | ICD-10-CM | POA: Diagnosis not present

## 2019-10-05 DIAGNOSIS — I130 Hypertensive heart and chronic kidney disease with heart failure and stage 1 through stage 4 chronic kidney disease, or unspecified chronic kidney disease: Secondary | ICD-10-CM | POA: Diagnosis not present

## 2019-10-05 DIAGNOSIS — Z95810 Presence of automatic (implantable) cardiac defibrillator: Secondary | ICD-10-CM | POA: Diagnosis not present

## 2019-10-05 DIAGNOSIS — I255 Ischemic cardiomyopathy: Secondary | ICD-10-CM | POA: Diagnosis not present

## 2019-10-05 DIAGNOSIS — Z9181 History of falling: Secondary | ICD-10-CM | POA: Diagnosis not present

## 2019-10-05 DIAGNOSIS — M1A9XX0 Chronic gout, unspecified, without tophus (tophi): Secondary | ICD-10-CM | POA: Diagnosis not present

## 2019-10-05 DIAGNOSIS — Z8673 Personal history of transient ischemic attack (TIA), and cerebral infarction without residual deficits: Secondary | ICD-10-CM | POA: Diagnosis not present

## 2019-10-05 DIAGNOSIS — I951 Orthostatic hypotension: Secondary | ICD-10-CM | POA: Diagnosis not present

## 2019-10-07 DIAGNOSIS — I5022 Chronic systolic (congestive) heart failure: Secondary | ICD-10-CM | POA: Diagnosis not present

## 2019-10-07 DIAGNOSIS — I4819 Other persistent atrial fibrillation: Secondary | ICD-10-CM | POA: Diagnosis not present

## 2019-10-07 DIAGNOSIS — I951 Orthostatic hypotension: Secondary | ICD-10-CM | POA: Diagnosis not present

## 2019-10-07 DIAGNOSIS — I2510 Atherosclerotic heart disease of native coronary artery without angina pectoris: Secondary | ICD-10-CM | POA: Diagnosis not present

## 2019-10-07 DIAGNOSIS — K5900 Constipation, unspecified: Secondary | ICD-10-CM | POA: Diagnosis not present

## 2019-10-07 DIAGNOSIS — I255 Ischemic cardiomyopathy: Secondary | ICD-10-CM | POA: Diagnosis not present

## 2019-10-07 DIAGNOSIS — I130 Hypertensive heart and chronic kidney disease with heart failure and stage 1 through stage 4 chronic kidney disease, or unspecified chronic kidney disease: Secondary | ICD-10-CM | POA: Diagnosis not present

## 2019-10-07 DIAGNOSIS — M1A9XX0 Chronic gout, unspecified, without tophus (tophi): Secondary | ICD-10-CM | POA: Diagnosis not present

## 2019-10-07 DIAGNOSIS — G4700 Insomnia, unspecified: Secondary | ICD-10-CM | POA: Diagnosis not present

## 2019-10-07 DIAGNOSIS — Z95810 Presence of automatic (implantable) cardiac defibrillator: Secondary | ICD-10-CM | POA: Diagnosis not present

## 2019-10-07 DIAGNOSIS — Z9181 History of falling: Secondary | ICD-10-CM | POA: Diagnosis not present

## 2019-10-07 DIAGNOSIS — N183 Chronic kidney disease, stage 3 unspecified: Secondary | ICD-10-CM | POA: Diagnosis not present

## 2019-10-07 DIAGNOSIS — T82837D Hemorrhage of cardiac prosthetic devices, implants and grafts, subsequent encounter: Secondary | ICD-10-CM | POA: Diagnosis not present

## 2019-10-07 DIAGNOSIS — Z8673 Personal history of transient ischemic attack (TIA), and cerebral infarction without residual deficits: Secondary | ICD-10-CM | POA: Diagnosis not present

## 2019-10-08 ENCOUNTER — Ambulatory Visit (INDEPENDENT_AMBULATORY_CARE_PROVIDER_SITE_OTHER): Payer: Medicare Other | Admitting: Student

## 2019-10-08 ENCOUNTER — Other Ambulatory Visit: Payer: Self-pay

## 2019-10-08 ENCOUNTER — Encounter: Payer: Self-pay | Admitting: Student

## 2019-10-08 DIAGNOSIS — Z95810 Presence of automatic (implantable) cardiac defibrillator: Secondary | ICD-10-CM | POA: Diagnosis not present

## 2019-10-08 DIAGNOSIS — I5022 Chronic systolic (congestive) heart failure: Secondary | ICD-10-CM | POA: Diagnosis not present

## 2019-10-08 DIAGNOSIS — T82837D Hemorrhage of cardiac prosthetic devices, implants and grafts, subsequent encounter: Secondary | ICD-10-CM

## 2019-10-08 DIAGNOSIS — Z8673 Personal history of transient ischemic attack (TIA), and cerebral infarction without residual deficits: Secondary | ICD-10-CM | POA: Diagnosis not present

## 2019-10-08 DIAGNOSIS — N183 Chronic kidney disease, stage 3 unspecified: Secondary | ICD-10-CM | POA: Diagnosis not present

## 2019-10-08 DIAGNOSIS — I130 Hypertensive heart and chronic kidney disease with heart failure and stage 1 through stage 4 chronic kidney disease, or unspecified chronic kidney disease: Secondary | ICD-10-CM | POA: Diagnosis not present

## 2019-10-08 DIAGNOSIS — I951 Orthostatic hypotension: Secondary | ICD-10-CM | POA: Diagnosis not present

## 2019-10-08 DIAGNOSIS — M1A9XX0 Chronic gout, unspecified, without tophus (tophi): Secondary | ICD-10-CM | POA: Diagnosis not present

## 2019-10-08 DIAGNOSIS — K5900 Constipation, unspecified: Secondary | ICD-10-CM | POA: Diagnosis not present

## 2019-10-08 DIAGNOSIS — Z9181 History of falling: Secondary | ICD-10-CM | POA: Diagnosis not present

## 2019-10-08 DIAGNOSIS — G4700 Insomnia, unspecified: Secondary | ICD-10-CM | POA: Diagnosis not present

## 2019-10-08 DIAGNOSIS — I4819 Other persistent atrial fibrillation: Secondary | ICD-10-CM | POA: Diagnosis not present

## 2019-10-08 DIAGNOSIS — I2510 Atherosclerotic heart disease of native coronary artery without angina pectoris: Secondary | ICD-10-CM | POA: Diagnosis not present

## 2019-10-08 DIAGNOSIS — I255 Ischemic cardiomyopathy: Secondary | ICD-10-CM | POA: Diagnosis not present

## 2019-10-08 NOTE — Progress Notes (Signed)
Pt seen today to re-assess hematoma. Pressure dressing removed. No blood noted on gauze.  Old steri-strips came off along with dressing.   Wound fully closed and well healed at this point.  Firm hematoma persists, but has nearly halved in size this the last time it was visualized by this PA.   Discussed via phone with Dr. Lovena Le. Will leave undressed for now and follow up in two weeks. Pt knows to call back with any changes.   Pt instructed to avoid any trauma to the area or sudden movements with this arm.  He is undergoing PT and was instructed to AVOID any PT with this arm. Letter written to take to PT.   Louis Ford [address] [place], PA-C  10/08/2019 4:24 PM

## 2019-10-12 DIAGNOSIS — I5022 Chronic systolic (congestive) heart failure: Secondary | ICD-10-CM | POA: Diagnosis not present

## 2019-10-12 DIAGNOSIS — I2510 Atherosclerotic heart disease of native coronary artery without angina pectoris: Secondary | ICD-10-CM | POA: Diagnosis not present

## 2019-10-12 DIAGNOSIS — M1A9XX0 Chronic gout, unspecified, without tophus (tophi): Secondary | ICD-10-CM | POA: Diagnosis not present

## 2019-10-12 DIAGNOSIS — I255 Ischemic cardiomyopathy: Secondary | ICD-10-CM | POA: Diagnosis not present

## 2019-10-12 DIAGNOSIS — N183 Chronic kidney disease, stage 3 unspecified: Secondary | ICD-10-CM | POA: Diagnosis not present

## 2019-10-12 DIAGNOSIS — K5900 Constipation, unspecified: Secondary | ICD-10-CM | POA: Diagnosis not present

## 2019-10-12 DIAGNOSIS — Z9181 History of falling: Secondary | ICD-10-CM | POA: Diagnosis not present

## 2019-10-12 DIAGNOSIS — I951 Orthostatic hypotension: Secondary | ICD-10-CM | POA: Diagnosis not present

## 2019-10-12 DIAGNOSIS — T82837D Hemorrhage of cardiac prosthetic devices, implants and grafts, subsequent encounter: Secondary | ICD-10-CM | POA: Diagnosis not present

## 2019-10-12 DIAGNOSIS — I4819 Other persistent atrial fibrillation: Secondary | ICD-10-CM | POA: Diagnosis not present

## 2019-10-12 DIAGNOSIS — I130 Hypertensive heart and chronic kidney disease with heart failure and stage 1 through stage 4 chronic kidney disease, or unspecified chronic kidney disease: Secondary | ICD-10-CM | POA: Diagnosis not present

## 2019-10-12 DIAGNOSIS — Z95810 Presence of automatic (implantable) cardiac defibrillator: Secondary | ICD-10-CM | POA: Diagnosis not present

## 2019-10-12 DIAGNOSIS — Z8673 Personal history of transient ischemic attack (TIA), and cerebral infarction without residual deficits: Secondary | ICD-10-CM | POA: Diagnosis not present

## 2019-10-12 DIAGNOSIS — G4700 Insomnia, unspecified: Secondary | ICD-10-CM | POA: Diagnosis not present

## 2019-10-13 ENCOUNTER — Other Ambulatory Visit: Payer: Self-pay | Admitting: Internal Medicine

## 2019-10-13 DIAGNOSIS — G8929 Other chronic pain: Secondary | ICD-10-CM

## 2019-10-13 DIAGNOSIS — G4700 Insomnia, unspecified: Secondary | ICD-10-CM

## 2019-10-14 DIAGNOSIS — Z8673 Personal history of transient ischemic attack (TIA), and cerebral infarction without residual deficits: Secondary | ICD-10-CM | POA: Diagnosis not present

## 2019-10-14 DIAGNOSIS — N183 Chronic kidney disease, stage 3 unspecified: Secondary | ICD-10-CM | POA: Diagnosis not present

## 2019-10-14 DIAGNOSIS — G4700 Insomnia, unspecified: Secondary | ICD-10-CM | POA: Diagnosis not present

## 2019-10-14 DIAGNOSIS — I255 Ischemic cardiomyopathy: Secondary | ICD-10-CM | POA: Diagnosis not present

## 2019-10-14 DIAGNOSIS — K5900 Constipation, unspecified: Secondary | ICD-10-CM | POA: Diagnosis not present

## 2019-10-14 DIAGNOSIS — I2510 Atherosclerotic heart disease of native coronary artery without angina pectoris: Secondary | ICD-10-CM | POA: Diagnosis not present

## 2019-10-14 DIAGNOSIS — I5022 Chronic systolic (congestive) heart failure: Secondary | ICD-10-CM | POA: Diagnosis not present

## 2019-10-14 DIAGNOSIS — Z9181 History of falling: Secondary | ICD-10-CM | POA: Diagnosis not present

## 2019-10-14 DIAGNOSIS — I4819 Other persistent atrial fibrillation: Secondary | ICD-10-CM | POA: Diagnosis not present

## 2019-10-14 DIAGNOSIS — M1A9XX0 Chronic gout, unspecified, without tophus (tophi): Secondary | ICD-10-CM | POA: Diagnosis not present

## 2019-10-14 DIAGNOSIS — T82837D Hemorrhage of cardiac prosthetic devices, implants and grafts, subsequent encounter: Secondary | ICD-10-CM | POA: Diagnosis not present

## 2019-10-14 DIAGNOSIS — I130 Hypertensive heart and chronic kidney disease with heart failure and stage 1 through stage 4 chronic kidney disease, or unspecified chronic kidney disease: Secondary | ICD-10-CM | POA: Diagnosis not present

## 2019-10-14 DIAGNOSIS — I951 Orthostatic hypotension: Secondary | ICD-10-CM | POA: Diagnosis not present

## 2019-10-14 DIAGNOSIS — Z95810 Presence of automatic (implantable) cardiac defibrillator: Secondary | ICD-10-CM | POA: Diagnosis not present

## 2019-10-19 DIAGNOSIS — N183 Chronic kidney disease, stage 3 unspecified: Secondary | ICD-10-CM | POA: Diagnosis not present

## 2019-10-19 DIAGNOSIS — G4700 Insomnia, unspecified: Secondary | ICD-10-CM | POA: Diagnosis not present

## 2019-10-19 DIAGNOSIS — I5022 Chronic systolic (congestive) heart failure: Secondary | ICD-10-CM | POA: Diagnosis not present

## 2019-10-19 DIAGNOSIS — I2510 Atherosclerotic heart disease of native coronary artery without angina pectoris: Secondary | ICD-10-CM | POA: Diagnosis not present

## 2019-10-19 DIAGNOSIS — Z95810 Presence of automatic (implantable) cardiac defibrillator: Secondary | ICD-10-CM | POA: Diagnosis not present

## 2019-10-19 DIAGNOSIS — K5900 Constipation, unspecified: Secondary | ICD-10-CM | POA: Diagnosis not present

## 2019-10-19 DIAGNOSIS — T82837D Hemorrhage of cardiac prosthetic devices, implants and grafts, subsequent encounter: Secondary | ICD-10-CM | POA: Diagnosis not present

## 2019-10-19 DIAGNOSIS — I4819 Other persistent atrial fibrillation: Secondary | ICD-10-CM | POA: Diagnosis not present

## 2019-10-19 DIAGNOSIS — I951 Orthostatic hypotension: Secondary | ICD-10-CM | POA: Diagnosis not present

## 2019-10-19 DIAGNOSIS — I130 Hypertensive heart and chronic kidney disease with heart failure and stage 1 through stage 4 chronic kidney disease, or unspecified chronic kidney disease: Secondary | ICD-10-CM | POA: Diagnosis not present

## 2019-10-19 DIAGNOSIS — Z9181 History of falling: Secondary | ICD-10-CM | POA: Diagnosis not present

## 2019-10-19 DIAGNOSIS — Z8673 Personal history of transient ischemic attack (TIA), and cerebral infarction without residual deficits: Secondary | ICD-10-CM | POA: Diagnosis not present

## 2019-10-19 DIAGNOSIS — I255 Ischemic cardiomyopathy: Secondary | ICD-10-CM | POA: Diagnosis not present

## 2019-10-19 DIAGNOSIS — M1A9XX0 Chronic gout, unspecified, without tophus (tophi): Secondary | ICD-10-CM | POA: Diagnosis not present

## 2019-10-20 ENCOUNTER — Other Ambulatory Visit: Payer: Self-pay

## 2019-10-20 ENCOUNTER — Ambulatory Visit: Payer: Medicare Other | Admitting: *Deleted

## 2019-10-20 DIAGNOSIS — I442 Atrioventricular block, complete: Secondary | ICD-10-CM

## 2019-10-20 NOTE — Patient Instructions (Signed)
Follow-up with Dr Lovena Le on Thursday.

## 2019-10-21 DIAGNOSIS — G4700 Insomnia, unspecified: Secondary | ICD-10-CM | POA: Diagnosis not present

## 2019-10-21 DIAGNOSIS — I255 Ischemic cardiomyopathy: Secondary | ICD-10-CM | POA: Diagnosis not present

## 2019-10-21 DIAGNOSIS — M1A9XX0 Chronic gout, unspecified, without tophus (tophi): Secondary | ICD-10-CM | POA: Diagnosis not present

## 2019-10-21 DIAGNOSIS — I130 Hypertensive heart and chronic kidney disease with heart failure and stage 1 through stage 4 chronic kidney disease, or unspecified chronic kidney disease: Secondary | ICD-10-CM | POA: Diagnosis not present

## 2019-10-21 DIAGNOSIS — Z95810 Presence of automatic (implantable) cardiac defibrillator: Secondary | ICD-10-CM | POA: Diagnosis not present

## 2019-10-21 DIAGNOSIS — K5900 Constipation, unspecified: Secondary | ICD-10-CM | POA: Diagnosis not present

## 2019-10-21 DIAGNOSIS — N183 Chronic kidney disease, stage 3 unspecified: Secondary | ICD-10-CM | POA: Diagnosis not present

## 2019-10-21 DIAGNOSIS — Z9181 History of falling: Secondary | ICD-10-CM | POA: Diagnosis not present

## 2019-10-21 DIAGNOSIS — T82837D Hemorrhage of cardiac prosthetic devices, implants and grafts, subsequent encounter: Secondary | ICD-10-CM | POA: Diagnosis not present

## 2019-10-21 DIAGNOSIS — Z8673 Personal history of transient ischemic attack (TIA), and cerebral infarction without residual deficits: Secondary | ICD-10-CM | POA: Diagnosis not present

## 2019-10-21 DIAGNOSIS — I2510 Atherosclerotic heart disease of native coronary artery without angina pectoris: Secondary | ICD-10-CM | POA: Diagnosis not present

## 2019-10-21 DIAGNOSIS — I5022 Chronic systolic (congestive) heart failure: Secondary | ICD-10-CM | POA: Diagnosis not present

## 2019-10-21 DIAGNOSIS — I4819 Other persistent atrial fibrillation: Secondary | ICD-10-CM | POA: Diagnosis not present

## 2019-10-21 DIAGNOSIS — I951 Orthostatic hypotension: Secondary | ICD-10-CM | POA: Diagnosis not present

## 2019-10-22 ENCOUNTER — Ambulatory Visit (INDEPENDENT_AMBULATORY_CARE_PROVIDER_SITE_OTHER): Payer: Medicare Other | Admitting: Internal Medicine

## 2019-10-22 ENCOUNTER — Other Ambulatory Visit: Payer: Self-pay

## 2019-10-22 ENCOUNTER — Encounter: Payer: Self-pay | Admitting: Internal Medicine

## 2019-10-22 VITALS — BP 112/76 | HR 89 | Ht 71.0 in | Wt 194.8 lb

## 2019-10-22 DIAGNOSIS — I428 Other cardiomyopathies: Secondary | ICD-10-CM | POA: Diagnosis not present

## 2019-10-22 DIAGNOSIS — Z95810 Presence of automatic (implantable) cardiac defibrillator: Secondary | ICD-10-CM

## 2019-10-22 DIAGNOSIS — T82837D Hemorrhage of cardiac prosthetic devices, implants and grafts, subsequent encounter: Secondary | ICD-10-CM | POA: Diagnosis not present

## 2019-10-22 NOTE — Progress Notes (Signed)
Patient reports increased pain in right axilla region and right edge of wound since 10/17/19, area firm to touch.. Edema decreased slightly from right sternal border to incision site.. Patient reports increased edema in area of incision to the right edge where pain has increased. Wound edges approximated with no drainage.Will set up for F/U with Dr Lovena Le on 10/22/19.

## 2019-10-22 NOTE — Progress Notes (Signed)
HPI Mr. Louis Ford returns today for followup. He is a pleasant 73 yo man with chronic systolic heart failure who is s/p ICD insertion. He underwent gen change out complicated by hematoma requring revision. At that time we consulted hemtalogy and they gave no rec's for preventing hematoma and he had a second hematoma. He has finally healed his incision but he has a large residual hematoma. He denies fever/chills/night sweats or device drainage.  No Known Allergies   Current Outpatient Medications  Medication Sig Dispense Refill  . acetaminophen (TYLENOL) 325 MG tablet Take 650 mg by mouth every 6 (six) hours as needed for mild pain.    [name] aminocaproic acid (AMICAR) 5 % SOLN Take 10 mLs by mouth every hour as needed (for persistent oozing).    [name] amiodarone (PACERONE) 200 MG tablet Take 1 tablet by mouth once daily (Patient taking differently: Take 200 mg by mouth daily. ) 30 tablet 0  . cephALEXin (KEFLEX) 500 MG capsule Take 1 capsule (500 mg total) by mouth 2 (two) times daily. 14 capsule 0  . divalproex (DEPAKOTE ER) 500 MG 24 hr tablet Take 1 tablet every night (Patient taking differently: Take 500 mg by mouth at bedtime. ) 30 tablet 11  . DULoxetine (CYMBALTA) 30 MG capsule Take 1 capsule by mouth once daily 90 capsule 1  . feeding supplement, ENSURE ENLIVE, (ENSURE ENLIVE) LIQD Take 237 mLs by mouth 3 (three) times daily between meals. 237 mL 12  . levETIRAcetam (KEPPRA) 1000 MG tablet Take 1 tablet (1,000 mg total) by mouth 2 (two) times daily. 60 tablet 11  . phenytoin (DILANTIN) 100 MG ER capsule Take 1 capsule (100 mg total) by mouth 2 (two) times daily. 60 capsule 11  . polyethylene glycol (MIRALAX / GLYCOLAX) packet Take 17 g by mouth daily as needed. (Patient taking differently: Take 17 g by mouth daily as needed for moderate constipation. ) 14 each 0  . simvastatin (ZOCOR) 20 MG tablet TAKE 1 TABLET BY MOUTH ONCE DAILY AT 6PM (Patient taking differently: Take 20 mg by mouth daily  at 6 PM. ) 90 tablet 1  . tamsulosin (FLOMAX) 0.4 MG CAPS capsule Take 1 capsule by mouth once daily (Patient taking differently: Take 0.4 mg by mouth daily. ) 90 capsule 0  . traMADol (ULTRAM) 50 MG tablet Take 1 tablet (50 mg total) by mouth every 6 (six) hours as needed for moderate pain. for pain (Patient taking differently: Take 50 mg by mouth every 6 (six) hours as needed for moderate pain. ) 10 tablet 0  . carvedilol (COREG) 3.125 MG tablet Take 1 tablet (3.125 mg total) by mouth 2 (two) times daily with a meal. 60 tablet 1  . ferrous sulfate 325 (65 FE) MG tablet Take 1 tablet (325 mg total) by mouth 2 (two) times daily with a meal. (Patient taking differently: Take 325 mg by mouth daily with breakfast. ) 60 tablet 0   No current facility-administered medications for this visit.      Past Medical History:  Diagnosis Date  . AICD (automatic cardioverter/defibrillator) present   . Atrial fibrillation   09/22/2012  . Blood loss anemia 04/18/2017   After GI bleed from colonoscopy and polypectomy  . CAD (coronary artery disease)   . Chronic systolic heart failure ([place])   . 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 GIB (gastrointestinal bleeding) 02/20/2017  . History of MRSA infection 05/2011  . HTN (  hypertension)   . Hx of adenomatous colonic polyps 02/20/2017   01/2017 - 3 cm sigmoid TV adenoma and other smaller polyps - had post-polypectomy bleed Tx w/ clips Consider repeat colonoscopy 3 yrs [name], MD, [name]   . Hyperlipidemia   . implantable cardiac defibrillator-Biotronik    Device Implanted 2006; s/p gen change 03/2011 : bleeding persistent with pocket erosion and infection; explant and reimplant  06/2011  . Ischemic cardiomyopathy    EF 15 to 20% by TTE and TEE in 09/2012.  Severe LV dysfunction  . Obesity    BMI 31 in 09/2012  . Persistent atrial fibrillation with rapid ventricular response ([place]) 04/21/2014  . Retroperitoneal bleed  04/21/2019  . Seizure disorder ([place]) latest 09/30/2012  . Seizures ([place])   . Stroke ([place])     ROS:   All systems reviewed and negative except as noted in the HPI.   Past Surgical History:  Procedure Laterality Date  . ALVEOLOPLASTY N/A 07/26/2019   Procedure: ALVEOLOPLASTY WITH RESUTURING OF ORAL WOUND;  Surgeon: [name], [name];  [facility];  Service: Oral Surgery;  Laterality: N/A;  . CARDIAC CATHETERIZATION N/A 10/12/2015   Procedure: Right Heart Cath;  Surgeon: [name], MD;  Location: [place] [facility] LAB;  Service: Cardiovascular;  Laterality: N/A;  . CARDIOVERSION  09/24/2012   Procedure: CARDIOVERSION;  Surgeon: [name], MD;  Location: [facility] ENDOSCOPY;  Service: Cardiovascular;  Laterality: N/A;  . CARDIOVERSION N/A 12/25/2017   Procedure: CARDIOVERSION;  Surgeon: [name], MD;  [facility];  Service: Cardiovascular;  Laterality: N/A;  . COLONOSCOPY W/ POLYPECTOMY  02/13/2017  . CORONARY ARTERY BYPASS GRAFT  2011   in [place] N/A 08/12/2019   Procedure: CAUTERIZATION OF ORAL BLEEDING;  Surgeon: [name], [name];  [facility];  Service: Oral Surgery;  Laterality: N/A;  . FLEXIBLE SIGMOIDOSCOPY N/A 02/20/2017   Procedure: FLEXIBLE SIGMOIDOSCOPY;  Surgeon: [name], MD;  Location: [facility] ENDOSCOPY;  Service: Endoscopy;  Laterality: N/A;  . FLEXIBLE SIGMOIDOSCOPY N/A 02/21/2017   Procedure: FLEXIBLE SIGMOIDOSCOPY;  Surgeon: [name], MD;  Location: [facility] ENDOSCOPY;  Service: Endoscopy;  Laterality: N/A;  . ICD    . ICD GENERATOR CHANGEOUT N/A 09/10/2019   Procedure: [place];  Surgeon: [name], MD;  Location: [place] CV LAB;  Service: Cardiovascular;  Laterality: N/A;  . POCKET REVISION/RELOCATION N/A 09/17/2019   Procedure: POCKET REVISION/RELOCATION;  Surgeon: Thompson Grayer, MD;  Location: [place] CV LAB;  Service: Cardiovascular;  Laterality: N/A;  . SUBMANDIBULAR GLAND EXCISION N/A 08/08/2019    Procedure: suture of oral wounds;  Surgeon: [name], [name];  [facility];  Service: Oral Surgery;  Laterality: N/A;  . TEE WITHOUT CARDIOVERSION  09/24/2012   Procedure: TRANSESOPHAGEAL ECHOCARDIOGRAM (TEE);  Surgeon: [name], MD;  Location: [facility] ENDOSCOPY;  Service: Cardiovascular;  Laterality: N/A;  dave/anesth, dl, cindy/echo      Family History  Problem Relation Age of Onset  . Arthritis Mother   . Heart disease Mother   . Heart attack Mother   . Other Father        smoker  . Hypertension Neg Hx        unknown  . Stroke Neg Hx        unknown     Social History   Socioeconomic History  . Marital status: Married    Spouse name: Not on file  . Number of children:  Not on file  . Years of education: Not on file  . Highest education level: Not on file  Occupational History  . Not on file  Social Needs  . Financial resource strain: Not on file  . Food insecurity    Worry: Not on file    Inability: Not on file  . Transportation needs    Medical: Not on file    Non-medical: Not on file  Tobacco Use  . Smoking status: Former Smoker    Types: Pipe    Quit date: 09/21/2008    Years since quitting: 11.0  . Smokeless tobacco: Former Systems developer    Quit date: 09/21/2008  Substance and Sexual Activity  . Alcohol use: No  . Drug use: No  . Sexual activity: Not Currently    Birth control/protection: None  Lifestyle  . Physical activity    Days per week: Not on file    Minutes per session: Not on file  . Stress: Not on file  Relationships  . Social Herbalist on phone: Not on file    Gets together: Not on file    Attends religious service: Not on file    Active member of club or organization: Not on file    Attends meetings of clubs or organizations: Not on file    Relationship status: Not on file  . Intimate partner violence    Fear of current or ex partner: Not on file    Emotionally abused: Not on file    Physically abused: Not on file    Forced  sexual activity: Not on file  Other Topics Concern  . Not on file  Social History Narrative   ** Merged History Encounter **         BP 112/76   Pulse 89   Ht 5\' 11"  (1.803 m)   Wt 194 lb 12.8 oz (88.4 kg)   SpO2 (!) 72%   BMI 27.17 kg/m   Physical Exam:  Well appearing NAD HEENT: Unremarkable Neck:  No JVD, no thyromegally Lymphatics:  No adenopathy Back:  No CVA tenderness Lungs:  Clear with no wheezes. Large hard hematoma on the right side of his chest. HEART:  Regular rate rhythm, no murmurs, no rubs, no clicks Abd:  soft, positive bowel sounds, no organomegally, no rebound, no guarding Ext:  2 plus pulses, no edema, no cyanosis, no clubbing Skin:  No rashes no nodules Neuro:  CN II through XII intact, motor grossly intact   DEVICE  Previously working normally.  Assess/Plan: 1. ICD hematoma - his wound has closed but I remain concerned. I will see him back in several weeks. If he develops any drainage or fever/chills/ he is encouraged to call or go to the ED. 2. Chronic systolic heart failure - his symptoms remain class 2.   Mikle Bosworth.D.

## 2019-10-22 NOTE — Patient Instructions (Addendum)
Medication Instructions:  Your physician recommends that you continue on your current medications as directed. Please refer to the Current Medication list given to you today.  Labwork: None ordered.  Testing/Procedures: None ordered.  Follow-Up: Your physician wants you to follow-up in: 3 weeks with device clinic on a day Dr. Lovena Le is in the office.   November 17, 2019 at 2:30 PM  Any Other Special Instructions Will Be Listed Below (If Applicable).  If you need a refill on your cardiac medications before your next appointment, please call your pharmacy.

## 2019-10-23 ENCOUNTER — Other Ambulatory Visit: Payer: Self-pay

## 2019-10-23 NOTE — Patient Outreach (Signed)
[facility]) Care Management  10/23/2019  Gray. Dec 07, 1946 [id number]   Medication Adherence call to Mr. Kullen Halverson HIPPA Compliant Voice message left with a call back number. Mr. Naitik is showing past due on Simvastatin 20 mg under [place].   [place] Management Direct Dial [phone]  Fax [phone] [email or web address]

## 2019-10-28 ENCOUNTER — Ambulatory Visit: Payer: Medicare Other | Admitting: Hematology

## 2019-10-29 ENCOUNTER — Ambulatory Visit (INDEPENDENT_AMBULATORY_CARE_PROVIDER_SITE_OTHER): Payer: Medicare Other | Admitting: Internal Medicine

## 2019-10-29 ENCOUNTER — Other Ambulatory Visit: Payer: Self-pay

## 2019-10-29 ENCOUNTER — Encounter: Payer: Self-pay | Admitting: Internal Medicine

## 2019-10-29 ENCOUNTER — Ambulatory Visit
Admission: RE | Admit: 2019-10-29 | Discharge: 2019-10-29 | Disposition: A | Discharge status: Home / Self Care | Payer: Medicare Other | Source: Ambulatory Visit | Attending: Internal Medicine | Admitting: Internal Medicine

## 2019-10-29 VITALS — BP 110/80 | HR 51 | Temp 97.8°F | Ht 71.0 in | Wt 192.0 lb

## 2019-10-29 DIAGNOSIS — M549 Dorsalgia, unspecified: Secondary | ICD-10-CM | POA: Diagnosis not present

## 2019-10-29 DIAGNOSIS — T148XXA Other injury of unspecified body region, initial encounter: Secondary | ICD-10-CM | POA: Diagnosis not present

## 2019-10-29 DIAGNOSIS — I700 Atherosclerosis of aorta: Secondary | ICD-10-CM | POA: Diagnosis not present

## 2019-10-29 DIAGNOSIS — I2699 Other pulmonary embolism without acute cor pulmonale: Secondary | ICD-10-CM | POA: Diagnosis not present

## 2019-10-29 MED ORDER — IOPAMIDOL (ISOVUE-370) INJECTION 76%
75.0000 mL | Freq: Once | INTRAVENOUS | Status: AC | PRN
  Administered 2019-10-29: 75 mL via INTRAVENOUS

## 2019-10-29 MED ORDER — TRAMADOL HCL 50 MG PO TABS: 50.0000 mg | ORAL_TABLET | Freq: Four times a day (QID) | ORAL | 0 refills | Status: AC | PRN

## 2019-10-29 NOTE — Assessment & Plan Note (Signed)
Rx tramadol for pain, suspect new hematoma. Getting CT angio to assess origin and size and impact. Depending on results treatment may vary. He does not take NSAIDs. Taking tylenol only for pain.

## 2019-10-29 NOTE — Progress Notes (Signed)
   Subjective:   Patient ID: Louis Ford., male    DOB: Mar 09, 1946, 73 y.o.   MRN: [id number]  HPI The patient is a 73 YO man coming in for severe pain in the upper back and left shoulder. Started about 1 week ago without cause. He denies injury or falls. Was doing something above his head. He has had spontaneous hematoma in the retroperitoneum earlier in the year. He states that this hurts as bad. Rates this 6/10 and has been constant since the onset. He is separately having some swelling around his ICD and slight blood on guaze. He just saw them last week and they want him to come back soon and may need to do another surgery on this. He had a large hematoma after the generator was changed on this ICD in September. This is healing without infection so far. Denies fevers or chills. Denies that the pain changes with breathing. No pain with walking with PT any more than usual. Did have to stop for SOB a few times and then resumed exercise without problems.   PMH, [facility], social history reviewed and updated  Review of Systems  Constitutional: Positive for activity change.  HENT: Negative.   Eyes: Negative.   Respiratory: Positive for shortness of breath. Negative for cough and chest tightness.   Cardiovascular: Positive for chest pain. Negative for palpitations and leg swelling.  Gastrointestinal: Negative for abdominal distention, abdominal pain, constipation, diarrhea, nausea and vomiting.  Musculoskeletal: Positive for arthralgias, back pain and myalgias.  Skin: Negative.   Neurological: Negative.   Psychiatric/Behavioral: Negative.     Objective:  Physical Exam Constitutional:      Appearance: He is well-developed.  HENT:     Head: Normocephalic and atraumatic.  Neck:     Musculoskeletal: Normal range of motion.  Cardiovascular:     Rate and Rhythm: Normal rate and regular rhythm.     Comments: Pain chest wall left sided and left flank and upper back, there is a soft tissue mass over  the left scapula about 8-10 cm oval shape very tender to palpation Pulmonary:     Effort: Pulmonary effort is normal. No respiratory distress.     Breath sounds: Normal breath sounds. No wheezing or rales.  Abdominal:     General: Bowel sounds are normal. There is no distension.     Palpations: Abdomen is soft.     Tenderness: There is no abdominal tenderness. There is no rebound.  Musculoskeletal:        General: Tenderness present.  Skin:    General: Skin is warm and dry.  Neurological:     Mental Status: He is alert and oriented to person, place, and time.     Coordination: Coordination abnormal.     Comments: walker     Vitals:   10/29/19 1431  BP: 110/80  Pulse: (!) 51  Temp: 97.8 F (36.6 C)  TempSrc: Oral  SpO2: 97%  Weight: 192 lb (87.1 kg)  Height: 5\' 11"  (1.803 m)    Assessment & Plan:  Visit time 25 minutes: greater than 50% of that time was spent in face to face counseling and coordination of care with the patient: counseled about potential causes and options based on results as well as pain management in the meantime.

## 2019-10-29 NOTE — Patient Instructions (Addendum)
We need to get a CT scan of the chest to find out if you have another hematoma. The spot on your back is suspicious for that.   We will have you go over to [facility] as a walk in to get the scan as this is the quickest way. Their address is [address].   We have sent in tramadol to use for pain and we will let you know as soon as possible about the results.

## 2019-10-29 NOTE — Assessment & Plan Note (Signed)
Concern for new large hematoma on the left scapular region. There is no associated trauma except perhaps raising arms above head but not with heavy lifting. He is having consistent pain with prior hematoma. Need to rule out dissection, PE, CT angio chest ordered to assess hematoma and origin. Treatment depending on findings.

## 2019-10-30 ENCOUNTER — Other Ambulatory Visit: Payer: Self-pay | Admitting: Internal Medicine

## 2019-10-30 ENCOUNTER — Other Ambulatory Visit: Payer: Self-pay

## 2019-10-30 DIAGNOSIS — K869 Disease of pancreas, unspecified: Secondary | ICD-10-CM

## 2019-10-30 NOTE — Patient Outreach (Signed)
[facility]) Care Management  10/30/2019  [place]. 1946/11/19 [id number]   Medication Adherence call to Mr. Louis Ford Telephone call to Patient regarding Medication Adherence unable to reach patient. Louis Ford is showing past due on Simvastatin 20 mg under [place].   [place] Management Direct Dial [phone]  Fax [phone] [email or web address]

## 2019-11-04 ENCOUNTER — Telehealth: Payer: Self-pay | Admitting: Internal Medicine

## 2019-11-04 NOTE — Telephone Encounter (Signed)
Patient is having bleeding and oozing coming from his defibrillator. Patient's wife states that he needs to be seen tomorrow.

## 2019-11-05 ENCOUNTER — Other Ambulatory Visit: Payer: Self-pay

## 2019-11-05 ENCOUNTER — Telehealth: Payer: Self-pay | Admitting: Student

## 2019-11-05 ENCOUNTER — Emergency Department (HOSPITAL_COMMUNITY): Admission: EM | Admit: 2019-11-05 | Payer: Medicare Other | Source: Home / Self Care

## 2019-11-05 ENCOUNTER — Ambulatory Visit (INDEPENDENT_AMBULATORY_CARE_PROVIDER_SITE_OTHER): Payer: Medicare Other | Admitting: Student

## 2019-11-05 ENCOUNTER — Encounter (HOSPITAL_COMMUNITY): Payer: Self-pay | Admitting: Emergency Medicine

## 2019-11-05 DIAGNOSIS — T148XXA Other injury of unspecified body region, initial encounter: Secondary | ICD-10-CM

## 2019-11-05 MED ORDER — CEPHALEXIN 500 MG PO CAPS: 500.0000 mg | ORAL_CAPSULE | Freq: Two times a day (BID) | ORAL | 0 refills | Status: DC

## 2019-11-05 NOTE — Telephone Encounter (Signed)
New Message    Mardene Celeste is calling because the pt was at his appt today and was advised to go to the hospital and be admitted  Mardene Celeste is stating  The hospital does not have any orders to admit the pt     Please call

## 2019-11-05 NOTE — Addendum Note (Signed)
Addended by: Barrington Ellison A on: 11/05/2019 03:05 PM   Modules accepted: Orders

## 2019-11-05 NOTE — ED Triage Notes (Signed)
patient brought in by wife stating patient was to be direct admitted due to infection around his defibrillator site. Patient c/o pain to left chest.

## 2019-11-05 NOTE — Telephone Encounter (Signed)
Addressed. Pt to be direct admitted, Monday.  Given strict return precautions with concerns for device infection.   Per discussion with Dr. Rayann Heman and Dr. Curt Bears, pt started on outpatient ABX, consisting of Keflex 500 mg BID starting tonight, for 10 days, to be assessed further on Monday during admission.    [name] [address]" [place], PA-C  11/05/2019 4:35 PM

## 2019-11-05 NOTE — Telephone Encounter (Signed)
[facility] to call patient and offer appt this afternoon at 2PM>   Chanetta Marshall, NP 11/05/2019 9:51 AM

## 2019-11-05 NOTE — Telephone Encounter (Signed)
Follow up   Please contact the patient per the previous message.

## 2019-11-05 NOTE — Telephone Encounter (Signed)
Pt is aware of appointment this afternoon.

## 2019-11-05 NOTE — Progress Notes (Signed)
Pt seen today for add on hematoma check. Pt has had complicated course with gen change 09/10/19 for ERI, and evacuation of hematoma 09/17/19.   He has required multiple pressure dressings and ongoing care.   Seen 10/22/2019 by Dr. Lovena Le with persistent, large, firm hematoma.   Today he presents with drainage. He has had fatigue and aches for the past 3 days. Drainage on bandage when removed is serosanguinous.  No frank purulence.  Area around his RIGHT SIDED device is warm to the touch, and he is tender along his pectoral region.   He also has painful swelling beneath LEFT shoulder blade, of unclear significance. He was seen by his PCP for this 10/29/2019. CT was negative at that time for acute intrathoracic process.   Reviewed with Dr. Lovena Le (attending) and Dr. Curt Bears (in office) will attempt direct admission for  further evaluation and treatment.  Per Dr. Lovena Le, pt may require device extraction.   [name] [address] [place], [place]  11/05/2019 2:39 PM

## 2019-11-06 ENCOUNTER — Other Ambulatory Visit (HOSPITAL_COMMUNITY)
Admission: RE | Admit: 2019-11-06 | Discharge: 2019-11-06 | Disposition: A | Discharge status: Home / Self Care | Payer: Medicare Other | Source: Ambulatory Visit | Attending: Internal Medicine | Admitting: Internal Medicine

## 2019-11-06 DIAGNOSIS — Z951 Presence of aortocoronary bypass graft: Secondary | ICD-10-CM | POA: Diagnosis not present

## 2019-11-06 DIAGNOSIS — I255 Ischemic cardiomyopathy: Secondary | ICD-10-CM | POA: Diagnosis not present

## 2019-11-06 DIAGNOSIS — T827XXA Infection and inflammatory reaction due to other cardiac and vascular devices, implants and grafts, initial encounter: Secondary | ICD-10-CM | POA: Diagnosis not present

## 2019-11-06 DIAGNOSIS — Z8614 Personal history of Methicillin resistant Staphylococcus aureus infection: Secondary | ICD-10-CM | POA: Diagnosis not present

## 2019-11-06 DIAGNOSIS — E039 Hypothyroidism, unspecified: Secondary | ICD-10-CM | POA: Diagnosis not present

## 2019-11-06 DIAGNOSIS — Z86010 Personal history of colonic polyps: Secondary | ICD-10-CM | POA: Diagnosis not present

## 2019-11-06 DIAGNOSIS — D500 Iron deficiency anemia secondary to blood loss (chronic): Secondary | ICD-10-CM | POA: Diagnosis not present

## 2019-11-06 DIAGNOSIS — D472 Monoclonal gammopathy: Secondary | ICD-10-CM | POA: Diagnosis not present

## 2019-11-06 DIAGNOSIS — E785 Hyperlipidemia, unspecified: Secondary | ICD-10-CM | POA: Diagnosis not present

## 2019-11-06 DIAGNOSIS — Z20828 Contact with and (suspected) exposure to other viral communicable diseases: Secondary | ICD-10-CM | POA: Insufficient documentation

## 2019-11-06 DIAGNOSIS — I5022 Chronic systolic (congestive) heart failure: Secondary | ICD-10-CM | POA: Diagnosis not present

## 2019-11-06 DIAGNOSIS — Z8673 Personal history of transient ischemic attack (TIA), and cerebral infarction without residual deficits: Secondary | ICD-10-CM | POA: Diagnosis not present

## 2019-11-06 DIAGNOSIS — Z23 Encounter for immunization: Secondary | ICD-10-CM | POA: Diagnosis not present

## 2019-11-06 DIAGNOSIS — I110 Hypertensive heart disease with heart failure: Secondary | ICD-10-CM | POA: Diagnosis not present

## 2019-11-06 DIAGNOSIS — I4819 Other persistent atrial fibrillation: Secondary | ICD-10-CM | POA: Diagnosis not present

## 2019-11-06 DIAGNOSIS — D62 Acute posthemorrhagic anemia: Secondary | ICD-10-CM | POA: Diagnosis not present

## 2019-11-06 DIAGNOSIS — D684 Acquired coagulation factor deficiency: Secondary | ICD-10-CM | POA: Diagnosis not present

## 2019-11-06 DIAGNOSIS — Z01812 Encounter for preprocedural laboratory examination: Secondary | ICD-10-CM | POA: Insufficient documentation

## 2019-11-06 DIAGNOSIS — Z87891 Personal history of nicotine dependence: Secondary | ICD-10-CM | POA: Diagnosis not present

## 2019-11-06 DIAGNOSIS — I2510 Atherosclerotic heart disease of native coronary artery without angina pectoris: Secondary | ICD-10-CM | POA: Diagnosis not present

## 2019-11-06 DIAGNOSIS — L7632 Postprocedural hematoma of skin and subcutaneous tissue following other procedure: Secondary | ICD-10-CM | POA: Diagnosis not present

## 2019-11-06 DIAGNOSIS — K769 Liver disease, unspecified: Secondary | ICD-10-CM | POA: Diagnosis not present

## 2019-11-06 NOTE — H&P (Addendum)
ELECTROPHYSIOLOGY H&P NOTE    Patient ID: [facility] MRN: [id number], DOB/AGE: 71/05/1946 71 y.o.  Admit date: 11/09/2019   Date of Consult: 11/09/2019  Primary Physician: [name], MD Primary Cardiologist: No primary care provider on file.  Electrophysiologist: Dr. Lovena Le   Admitting Provider: Dr. Lovena Le  Reason for admission: Persistent hematoma at ICD site; possible infection  Patient Profile: [facility]. is a 73 y.o. male with a history of chronic systolic CHF s/p ICD insertion who is being admitted today for further treatment and evaluation of ICD pocket hematoma and likely infection.   HPI:  [facility]. is a 73 y.o. male with history of chronic systolic CHF, ICM, CAD, h/o spontaneous retroperitoneal bleed, ICD with history of appropriate therapies for VT, and h/o ICD complications including extraction of a left sided device due to "bleeding" per patient.  Pt had gen change 09/10/2019 due to device at [facility] [place].  Course was complicated by hematoma with oozing, requiring evacuation on 09/17/2019.  Pt had recurrent hematoma and has been managed in the office with pressure dressings since. Hematology was consulted at time of hematoma evacuation, but no recommendations other than meticulous bleeding control at the time of the procedure for preventing a second hematoma.    Pt presented to the office 11/05/2019 with recurrent oozing and aches around the area for the prior 3 days. Assessment of the area showed the swelling had mildly improved, but serosanguinous drainage was present on the dressing. There was a questionable foul smell initially, which was difficult to fully appreciate masked, and was not identified again. Due to high bed volumes in the hospital, discussion with EP MDs ended in recommendation of outpatient antibiotics, with strict return precautions and COVID testing with direct admission planned for 11/09/2019.  He presents today for admission.   He  denies chest pain, palpitations, dyspnea, PND, orthopnea, nausea, vomiting, dizziness, syncope, edema, weight gain, or early satiety. He does note some back and rib pain.  Past Medical History:  Diagnosis Date  . AICD (automatic cardioverter/defibrillator) present   . Atrial fibrillation   09/22/2012  . Blood loss anemia 04/18/2017   After GI bleed from colonoscopy and polypectomy  . CAD (coronary artery disease)   . Chronic systolic heart failure ([place])   . 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 GIB (gastrointestinal bleeding) 02/20/2017  . History of MRSA infection 05/2011  . HTN (hypertension)   . Hx of adenomatous colonic polyps 02/20/2017   01/2017 - 3 cm sigmoid TV adenoma and other smaller polyps - had post-polypectomy bleed Tx w/ clips Consider repeat colonoscopy 3 yrs [name], MD, [name]   . Hyperlipidemia   . implantable cardiac defibrillator-Biotronik    Device Implanted 2006; s/p gen change 03/2011 : bleeding persistent with pocket erosion and infection; explant and reimplant  06/2011  . Ischemic cardiomyopathy    EF 15 to 20% by TTE and TEE in 09/2012.  Severe LV dysfunction  . Obesity    BMI 31 in 09/2012  . Persistent atrial fibrillation with rapid ventricular response ([place]) 04/21/2014  . Retroperitoneal bleed 04/21/2019  . Seizure disorder ([place]) latest 09/30/2012  . Seizures ([place])   . Stroke [facility])      Surgical History:  Past Surgical History:  Procedure Laterality Date  . ALVEOLOPLASTY N/A 07/26/2019   Procedure: ALVEOLOPLASTY WITH RESUTURING OF ORAL WOUND;  Surgeon: [name], [name];  Location: [facility] ORS;  Service: Oral Surgery;  Laterality: N/A;  . CARDIAC CATHETERIZATION N/A 10/12/2015   Procedure: Right Heart Cath;  Surgeon: [name], MD;  Location: [facility] CV LAB;  Service: Cardiovascular;  Laterality: N/A;  . CARDIOVERSION  09/24/2012   Procedure: CARDIOVERSION;  Surgeon: [name], MD;  Location: [facility]  ENDOSCOPY;  Service: Cardiovascular;  Laterality: N/A;  . CARDIOVERSION N/A 12/25/2017   Procedure: CARDIOVERSION;  Surgeon: [name], MD;  Location: [facility] ORS;  Service: Cardiovascular;  Laterality: N/A;  . COLONOSCOPY W/ POLYPECTOMY  02/13/2017  . CORONARY ARTERY BYPASS GRAFT  2011   in [place] N/A 08/12/2019   Procedure: CAUTERIZATION OF ORAL BLEEDING;  Surgeon: [name], [name];  Location: [facility]S;  Service: Oral Surgery;  Laterality: N/A;  . FLEXIBLE SIGMOIDOSCOPY N/A 02/20/2017   Procedure: FLEXIBLE SIGMOIDOSCOPY;  Surgeon: [name], MD;  Location: [facility] ENDOSCOPY;  Service: Endoscopy;  Laterality: N/A;  . FLEXIBLE SIGMOIDOSCOPY N/A 02/21/2017   Procedure: FLEXIBLE SIGMOIDOSCOPY;  Surgeon: [name], MD;  Location: [facility] ENDOSCOPY;  Service: Endoscopy;  Laterality: N/A;  . ICD    . ICD GENERATOR CHANGEOUT N/A 09/10/2019   Procedure: [place];  Surgeon: [name], MD;  Location: [place] CV LAB;  Service: Cardiovascular;  Laterality: N/A;  . POCKET REVISION/RELOCATION N/A 09/17/2019   Procedure: POCKET REVISION/RELOCATION;  Surgeon: Thompson Grayer, MD;  Location: [facility] CV LAB;  Service: Cardiovascular;  Laterality: N/A;  . SUBMANDIBULAR GLAND EXCISION N/A 08/08/2019   Procedure: suture of oral wounds;  Surgeon: [name], [name];  Location: [facility] ORS;  Service: Oral Surgery;  Laterality: N/A;  . TEE WITHOUT CARDIOVERSION  09/24/2012   Procedure: TRANSESOPHAGEAL ECHOCARDIOGRAM (TEE);  Surgeon: [name], MD;  Location[facility] ENDOSCOPY;  Service: Cardiovascular;  Laterality: N/A;  dave/anesth, dl, cindy/echo      Medications Prior to Admission  Medication Sig Dispense Refill Last Dose  . acetaminophen (TYLENOL) 325 MG tablet Take 650 mg by mouth every 6 (six) hours as needed for mild pain.     [name] aminocaproic acid (AMICAR) 5 % SOLN Take 10 mLs by mouth every hour as needed (for persistent oozing).     [name] amiodarone (PACERONE) 200 MG tablet Take 1  tablet by mouth once daily (Patient taking differently: Take 200 mg by mouth daily. ) 30 tablet 0   . carvedilol (COREG) 3.125 MG tablet Take 1 tablet (3.125 mg total) by mouth 2 (two) times daily with a meal. 60 tablet 1   . cephALEXin (KEFLEX) 500 MG capsule Take 1 capsule (500 mg total) by mouth 2 (two) times daily. 20 capsule 0   . divalproex (DEPAKOTE ER) 500 MG 24 hr tablet Take 1 tablet every night (Patient taking differently: Take 500 mg by mouth at bedtime. ) 30 tablet 11   . DULoxetine (CYMBALTA) 30 MG capsule Take 1 capsule by mouth once daily 90 capsule 1   . feeding supplement, ENSURE ENLIVE, (ENSURE ENLIVE) LIQD Take 237 mLs by mouth 3 (three) times daily between meals. 237 mL 12   . ferrous sulfate 325 (65 FE) MG tablet Take 1 tablet (325 mg total) by mouth 2 (two) times daily with a meal. (Patient taking differently: Take 325 mg by mouth daily with breakfast. ) 60 tablet 0   . levETIRAcetam (KEPPRA) 1000 MG tablet Take 1 tablet (1,000 mg total) by mouth 2 (two) times daily. 60 tablet 11   . phenytoin (DILANTIN) 100 MG ER capsule Take 1 capsule (100  mg total) by mouth 2 (two) times daily. 60 capsule 11   . polyethylene glycol (MIRALAX / GLYCOLAX) packet Take 17 g by mouth daily as needed. (Patient taking differently: Take 17 g by mouth daily as needed for moderate constipation. ) 14 each 0   . simvastatin (ZOCOR) 20 MG tablet TAKE 1 TABLET BY MOUTH ONCE DAILY AT 6PM (Patient taking differently: Take 20 mg by mouth daily at 6 PM. ) 90 tablet 1   . tamsulosin (FLOMAX) 0.4 MG CAPS capsule Take 1 capsule by mouth once daily (Patient taking differently: Take 0.4 mg by mouth daily. ) 90 capsule 0   . traMADol (ULTRAM) 50 MG tablet Take 1 tablet (50 mg total) by mouth every 6 (six) hours as needed for moderate pain. for pain 20 tablet 0     Inpatient Medications:   Allergies: No Known Allergies  Social History   Socioeconomic History  . Marital status: Married    Spouse name: Not on  file  . Number of children: Not on file  . Years of education: Not on file  . Highest education level: Not on file  Occupational History  . Not on file  Social Needs  . Financial resource strain: Not on file  . Food insecurity    Worry: Not on file    Inability: Not on file  . Transportation needs    Medical: Not on file    Non-medical: Not on file  Tobacco Use  . Smoking status: Former Smoker    Types: Pipe    Quit date: 09/21/2008    Years since quitting: 11.1  . Smokeless tobacco: Former Systems developer    Quit date: 09/21/2008  Substance and Sexual Activity  . Alcohol use: No  . Drug use: No  . Sexual activity: Not Currently    Birth control/protection: None  Lifestyle  . Physical activity    Days per week: Not on file    Minutes per session: Not on file  . Stress: Not on file  Relationships  . Social Herbalist on phone: Not on file    Gets together: Not on file    Attends religious service: Not on file    Active member of club or organization: Not on file    Attends meetings of clubs or organizations: Not on file    Relationship status: Not on file  . Intimate partner violence    Fear of current or ex partner: Not on file    Emotionally abused: Not on file    Physically abused: Not on file    Forced sexual activity: Not on file  Other Topics Concern  . Not on file  Social History Narrative   ** Merged History Encounter **         Family History  Problem Relation Age of Onset  . Arthritis Mother   . Heart disease Mother   . Heart attack Mother   . Other Father        smoker  . Hypertension Neg Hx        unknown  . Stroke Neg Hx        unknown     Review of Systems: All other systems reviewed and are otherwise negative except as noted above.  Physical Exam: Vitals:   11/09/19 1429 11/09/19 1434  BP:  109/80  Pulse:  85  Resp:  18  Temp:  98.4 F (36.9 C)  TempSrc:  Oral  SpO2:  100%  Weight: 85.3 kg   Height:  5\' 11"  (1.803 m)    GEN-  The patient is well appearing, alert and oriented x 3 today.   HEENT: normocephalic, atraumatic; sclera clear, conjunctiva pink; hearing intact; oropharynx clear; neck supple Lungs- Clear to ausculation bilaterally, normal work of breathing.  No wheezes, rales, rhonchi Heart- Regular rate and rhythm  GI- soft, non-tender, non-distended, bowel sounds present Extremities- no clubbing, cyanosis, or edema; MS- no significant deformity or atrophy Skin- warm and dry, no rash or lesion; +right chest hematoma Psych- euthymic mood, full affect Neuro- strength and sensation are intact  Labs:   Lab Results  Component Value Date   WBC 6.2 11/09/2019   HGB 10.1 (L) 11/09/2019   HCT 32.7 (L) 11/09/2019   MCV 91.3 11/09/2019   PLT 286 11/09/2019    Recent Labs  Lab 11/09/19 1440  NA 136  K 3.8  CL 102  CO2 24  BUN 19  CREATININE 0.94  CALCIUM 8.5*  PROT 9.1*  BILITOT 1.2  ALKPHOS 100  ALT 10  AST 15  GLUCOSE 89      Radiology/Studies: Ct Angio Chest Aorta W/cm &/or Wo/cm  Result Date: 10/29/2019 CLINICAL DATA:  73 year old male with left shoulder pain. Concern for aortic dissection. EXAM: CT ANGIOGRAPHY CHEST WITH CONTRAST TECHNIQUE: Multidetector CT imaging of the chest was performed using the standard protocol during bolus administration of intravenous contrast. Multiplanar CT image reconstructions and MIPs were obtained to evaluate the vascular anatomy. CONTRAST:  71mL ISOVUE-370 IOPAMIDOL (ISOVUE-370) INJECTION 76% COMPARISON:  Chest CT dated 04/21/2019 FINDINGS: Evaluation is limited due to streak artifact caused by patient's arms. Cardiovascular: Mild dilatation of the right atrium. There is multi vessel coronary vascular calcification and postsurgical changes of CABG. Left pericardial calcification likely sequela of prior infection/inflammation. Right-sided pacemaker device noted. There is mild atherosclerotic calcification of the thoracic aorta. No aneurysmal dilatation or  dissection. Evaluation of the pulmonary arteries is limited due to suboptimal opacification and timing of the contrast. No large or central pulmonary artery embolus identified. Mediastinum/Nodes: No hilar or mediastinal adenopathy. The esophagus is grossly unremarkable. No mediastinal fluid collection. Lungs/Pleura: Left lung base linear atelectasis/scarring. There is no focal consolidation, pleural effusion, or pneumothorax. The central airways are patent. Upper Abdomen: Multiple bilateral renal cystic lesions, suboptimally characterized. There is atrophy of the body and tail of the pancreas. This findings are similar to prior CT. The pancreatic mass is not excluded but cannot be evaluated on this chest CT. Dedicated CT of the abdomen pelvis without and with contrast is recommended for better evaluation. Musculoskeletal: Median sternotomy wires. Degenerative changes of the spine. No acute osseous pathology. Left pectoral intramuscular lipoma. Review of the MIP images confirms the above findings. IMPRESSION: 1. No acute intrathoracic pathology. No aortic dissection. CT evidence of central pulmonary artery embolus. 2. Aortic Atherosclerosis (ICD10-I70.0). 3. Atrophic appearance of the body and tail of the pancreas concerning for underlying pancreatic lesion. Dedicated CT of the abdomen pelvis recommended for better evaluation. Electronically Signed   By: Anner Crete M.D.   On: 10/29/2019 16:42    DEVICE HISTORY: Biotronik device implanted 2006 Device change 03/2011 -> bleeding persistent with pocket erosion and infection leading to explant  Re-implant 06/2011 Gen change 09/10/2019 -> hematoma and bleeding Pocket revision 09/16/2001 -> continued hematoma  Assessment/Plan: 1.  ICD pocket hematoma with drainage consistent with device infection Pt started on po antibiotics, Keflex 500 mg BID 11/05/2019 Presents today for evaluation and further work up, including  consideration of device extraction.  Will  likely need device extraction. Will order blood cultures today.   Dr Lovena Le to see later this afternoon   For questions or updates, please contact [facility] Please consult [email or web address] for contact info under Cardiology/STEMI.  Signed, [name], MD   11/09/2019 4:23 PM  EP Attending  Patient seen and examined. Agree with above with minimal modification. The patient is admitted for IV anti-biotics and planned extraction of his device due to ICD pocket infection due to recurrent and ongoing pocket drainage/contamination. His history is accurately described above. The patient is not toxic appearing but does look chronically ill to me and appears weaker than when I saw him a few weeks ago. I plan ICD system extraction preceeded by IV anti-biotics. I would like a recommendation by the hematology service to help [place] prevent intraoperative bleeding as well as post operative bleeding as removal of his chronic leads will put the patient at increased risk.   Mikle Bosworth.D.

## 2019-11-07 LAB — SARS CORONAVIRUS 2 (TAT 6-24 HRS): SARS Coronavirus 2: NEGATIVE

## 2019-11-09 ENCOUNTER — Telehealth: Payer: Self-pay

## 2019-11-09 ENCOUNTER — Inpatient Hospital Stay (HOSPITAL_COMMUNITY)
Admission: AD | Admit: 2019-11-09 | Discharge: 2019-11-20 | DRG: 261 | Disposition: A | Discharge status: Home Health | Payer: Medicare Other | Source: Ambulatory Visit | Attending: Internal Medicine | Admitting: Internal Medicine

## 2019-11-09 DIAGNOSIS — T82837A Hemorrhage of cardiac prosthetic devices, implants and grafts, initial encounter: Secondary | ICD-10-CM | POA: Diagnosis not present

## 2019-11-09 DIAGNOSIS — J9811 Atelectasis: Secondary | ICD-10-CM | POA: Diagnosis not present

## 2019-11-09 DIAGNOSIS — I5022 Chronic systolic (congestive) heart failure: Secondary | ICD-10-CM

## 2019-11-09 DIAGNOSIS — T827XXA Infection and inflammatory reaction due to other cardiac and vascular devices, implants and grafts, initial encounter: Secondary | ICD-10-CM | POA: Diagnosis not present

## 2019-11-09 DIAGNOSIS — I4819 Other persistent atrial fibrillation: Secondary | ICD-10-CM | POA: Diagnosis present

## 2019-11-09 DIAGNOSIS — D62 Acute posthemorrhagic anemia: Secondary | ICD-10-CM | POA: Diagnosis not present

## 2019-11-09 DIAGNOSIS — Z95810 Presence of automatic (implantable) cardiac defibrillator: Secondary | ICD-10-CM

## 2019-11-09 DIAGNOSIS — Z8614 Personal history of Methicillin resistant Staphylococcus aureus infection: Secondary | ICD-10-CM | POA: Diagnosis not present

## 2019-11-09 DIAGNOSIS — Z79899 Other long term (current) drug therapy: Secondary | ICD-10-CM

## 2019-11-09 DIAGNOSIS — S064XAA Epidural hemorrhage with loss of consciousness status unknown, initial encounter: Secondary | ICD-10-CM

## 2019-11-09 DIAGNOSIS — D500 Iron deficiency anemia secondary to blood loss (chronic): Secondary | ICD-10-CM

## 2019-11-09 DIAGNOSIS — D682 Hereditary deficiency of other clotting factors: Secondary | ICD-10-CM | POA: Diagnosis present

## 2019-11-09 DIAGNOSIS — G40909 Epilepsy, unspecified, not intractable, without status epilepticus: Secondary | ICD-10-CM | POA: Diagnosis present

## 2019-11-09 DIAGNOSIS — Z23 Encounter for immunization: Secondary | ICD-10-CM

## 2019-11-09 DIAGNOSIS — I110 Hypertensive heart disease with heart failure: Secondary | ICD-10-CM | POA: Diagnosis present

## 2019-11-09 DIAGNOSIS — I2510 Atherosclerotic heart disease of native coronary artery without angina pectoris: Secondary | ICD-10-CM | POA: Diagnosis present

## 2019-11-09 DIAGNOSIS — T82837D Hemorrhage of cardiac prosthetic devices, implants and grafts, subsequent encounter: Secondary | ICD-10-CM | POA: Diagnosis not present

## 2019-11-09 DIAGNOSIS — Y831 Surgical operation with implant of artificial internal device as the cause of abnormal reaction of the patient, or of later complication, without mention of misadventure at the time of the procedure: Secondary | ICD-10-CM | POA: Diagnosis present

## 2019-11-09 DIAGNOSIS — E039 Hypothyroidism, unspecified: Secondary | ICD-10-CM | POA: Diagnosis present

## 2019-11-09 DIAGNOSIS — D472 Monoclonal gammopathy: Secondary | ICD-10-CM | POA: Diagnosis present

## 2019-11-09 DIAGNOSIS — Z20828 Contact with and (suspected) exposure to other viral communicable diseases: Secondary | ICD-10-CM | POA: Diagnosis present

## 2019-11-09 DIAGNOSIS — T827XXD Infection and inflammatory reaction due to other cardiac and vascular devices, implants and grafts, subsequent encounter: Secondary | ICD-10-CM | POA: Diagnosis not present

## 2019-11-09 DIAGNOSIS — Z87891 Personal history of nicotine dependence: Secondary | ICD-10-CM

## 2019-11-09 DIAGNOSIS — D689 Coagulation defect, unspecified: Secondary | ICD-10-CM | POA: Diagnosis not present

## 2019-11-09 DIAGNOSIS — K769 Liver disease, unspecified: Secondary | ICD-10-CM | POA: Diagnosis present

## 2019-11-09 DIAGNOSIS — I081 Rheumatic disorders of both mitral and tricuspid valves: Secondary | ICD-10-CM | POA: Diagnosis not present

## 2019-11-09 DIAGNOSIS — I255 Ischemic cardiomyopathy: Secondary | ICD-10-CM | POA: Diagnosis present

## 2019-11-09 DIAGNOSIS — Z8249 Family history of ischemic heart disease and other diseases of the circulatory system: Secondary | ICD-10-CM

## 2019-11-09 DIAGNOSIS — D684 Acquired coagulation factor deficiency: Secondary | ICD-10-CM | POA: Diagnosis present

## 2019-11-09 DIAGNOSIS — Z951 Presence of aortocoronary bypass graft: Secondary | ICD-10-CM | POA: Diagnosis not present

## 2019-11-09 DIAGNOSIS — Z86010 Personal history of colonic polyps: Secondary | ICD-10-CM

## 2019-11-09 DIAGNOSIS — L7632 Postprocedural hematoma of skin and subcutaneous tissue following other procedure: Secondary | ICD-10-CM | POA: Diagnosis not present

## 2019-11-09 DIAGNOSIS — Z8673 Personal history of transient ischemic attack (TIA), and cerebral infarction without residual deficits: Secondary | ICD-10-CM

## 2019-11-09 DIAGNOSIS — I083 Combined rheumatic disorders of mitral, aortic and tricuspid valves: Secondary | ICD-10-CM | POA: Diagnosis not present

## 2019-11-09 DIAGNOSIS — E785 Hyperlipidemia, unspecified: Secondary | ICD-10-CM | POA: Diagnosis present

## 2019-11-09 DIAGNOSIS — T827XXS Infection and inflammatory reaction due to other cardiac and vascular devices, implants and grafts, sequela: Secondary | ICD-10-CM

## 2019-11-09 DIAGNOSIS — Z79891 Long term (current) use of opiate analgesic: Secondary | ICD-10-CM

## 2019-11-09 DIAGNOSIS — F329 Major depressive disorder, single episode, unspecified: Secondary | ICD-10-CM | POA: Diagnosis present

## 2019-11-09 DIAGNOSIS — D509 Iron deficiency anemia, unspecified: Secondary | ICD-10-CM | POA: Diagnosis not present

## 2019-11-09 DIAGNOSIS — Z86718 Personal history of other venous thrombosis and embolism: Secondary | ICD-10-CM

## 2019-11-09 DIAGNOSIS — I10 Essential (primary) hypertension: Secondary | ICD-10-CM | POA: Diagnosis not present

## 2019-11-09 LAB — CBC WITH DIFFERENTIAL/PLATELET
Abs Immature Granulocytes: 0.03 10*3/uL (ref 0.00–0.07)
Basophils Absolute: 0 10*3/uL (ref 0.0–0.1)
Basophils Relative: 0 %
Eosinophils Absolute: 0.3 10*3/uL (ref 0.0–0.5)
Eosinophils Relative: 4 %
HCT: 32.7 % — ABNORMAL LOW (ref 39.0–52.0)
Hemoglobin: 10.1 g/dL — ABNORMAL LOW (ref 13.0–17.0)
Immature Granulocytes: 1 %
Lymphocytes Relative: 16 %
Lymphs Abs: 1 10*3/uL (ref 0.7–4.0)
MCH: 28.2 pg (ref 26.0–34.0)
MCHC: 30.9 g/dL (ref 30.0–36.0)
MCV: 91.3 fL (ref 80.0–100.0)
Monocytes Absolute: 0.9 10*3/uL (ref 0.1–1.0)
Monocytes Relative: 15 %
Neutro Abs: 4 10*3/uL (ref 1.7–7.7)
Neutrophils Relative %: 64 %
Platelets: 286 10*3/uL (ref 150–400)
RBC: 3.58 MIL/uL — ABNORMAL LOW (ref 4.22–5.81)
RDW: 17.6 % — ABNORMAL HIGH (ref 11.5–15.5)
WBC: 6.2 10*3/uL (ref 4.0–10.5)
nRBC: 0 % (ref 0.0–0.2)

## 2019-11-09 LAB — COMPREHENSIVE METABOLIC PANEL
ALT: 10 U/L (ref 0–44)
AST: 15 U/L (ref 15–41)
Albumin: 2.6 g/dL — ABNORMAL LOW (ref 3.5–5.0)
Alkaline Phosphatase: 100 U/L (ref 38–126)
Anion gap: 10 (ref 5–15)
BUN: 19 mg/dL (ref 8–23)
CO2: 24 mmol/L (ref 22–32)
Calcium: 8.5 mg/dL — ABNORMAL LOW (ref 8.9–10.3)
Chloride: 102 mmol/L (ref 98–111)
Creatinine, Ser: 0.94 mg/dL (ref 0.61–1.24)
GFR calc Af Amer: >60 mL/min (ref >60)
GFR calc non Af Amer: >60 mL/min (ref >60)
Glucose, Bld: 89 mg/dL (ref 70–99)
Potassium: 3.8 mmol/L (ref 3.5–5.1)
Sodium: 136 mmol/L (ref 135–145)
Total Bilirubin: 1.2 mg/dL (ref 0.3–1.2)
Total Protein: 9.1 g/dL — ABNORMAL HIGH (ref 6.5–8.1)

## 2019-11-09 LAB — SARS CORONAVIRUS 2 (TAT 6-24 HRS): SARS Coronavirus 2: NEGATIVE

## 2019-11-09 LAB — MAGNESIUM: Magnesium: 1.5 mg/dL — ABNORMAL LOW (ref 1.7–2.4)

## 2019-11-09 MED ORDER — LEVETIRACETAM 500 MG PO TABS
1000.0000 mg | ORAL_TABLET | Freq: Two times a day (BID) | ORAL | Status: DC
  Administered 2019-11-09 – 2019-11-20 (×21): 1000 mg via ORAL
  Filled 2019-11-09 (×23): qty 2

## 2019-11-09 MED ORDER — CEPHALEXIN 250 MG PO CAPS
500.0000 mg | ORAL_CAPSULE | Freq: Two times a day (BID) | ORAL | Status: DC
  Administered 2019-11-09: 500 mg via ORAL
  Filled 2019-11-09 (×2): qty 2

## 2019-11-09 MED ORDER — TRAMADOL HCL 50 MG PO TABS
50.0000 mg | ORAL_TABLET | Freq: Four times a day (QID) | ORAL | Status: DC | PRN
  Administered 2019-11-10 – 2019-11-15 (×7): 50 mg via ORAL
  Filled 2019-11-09 (×8): qty 1

## 2019-11-09 MED ORDER — ACETAMINOPHEN 325 MG PO TABS: 650.0000 mg | ORAL_TABLET | Freq: Four times a day (QID) | ORAL | Status: DC | PRN

## 2019-11-09 MED ORDER — FERROUS SULFATE 325 (65 FE) MG PO TABS
325.0000 mg | ORAL_TABLET | Freq: Two times a day (BID) | ORAL | Status: DC
  Administered 2019-11-09 – 2019-11-10 (×3): 325 mg via ORAL
  Filled 2019-11-09 (×3): qty 1

## 2019-11-09 MED ORDER — ONDANSETRON HCL 4 MG/2ML IJ SOLN
4.0000 mg | Freq: Four times a day (QID) | INTRAMUSCULAR | Status: DC | PRN
  Administered 2019-11-13: 4 mg via INTRAVENOUS

## 2019-11-09 MED ORDER — DULOXETINE HCL 30 MG PO CPEP
30.0000 mg | ORAL_CAPSULE | Freq: Every day | ORAL | Status: DC
  Administered 2019-11-10 – 2019-11-20 (×10): 30 mg via ORAL
  Filled 2019-11-09 (×11): qty 1

## 2019-11-09 MED ORDER — ENSURE ENLIVE PO LIQD
237.0000 mL | Freq: Three times a day (TID) | ORAL | Status: DC
  Administered 2019-11-09 – 2019-11-20 (×20): 237 mL via ORAL

## 2019-11-09 MED ORDER — SIMVASTATIN 20 MG PO TABS
20.0000 mg | ORAL_TABLET | Freq: Every day | ORAL | Status: DC
  Administered 2019-11-09 – 2019-11-19 (×10): 20 mg via ORAL
  Filled 2019-11-09 (×10): qty 1

## 2019-11-09 MED ORDER — TAMSULOSIN HCL 0.4 MG PO CAPS
0.4000 mg | ORAL_CAPSULE | Freq: Every day | ORAL | Status: DC
  Administered 2019-11-10 – 2019-11-20 (×10): 0.4 mg via ORAL
  Filled 2019-11-09 (×11): qty 1

## 2019-11-09 MED ORDER — CARVEDILOL 3.125 MG PO TABS
3.1250 mg | ORAL_TABLET | Freq: Two times a day (BID) | ORAL | Status: DC
  Administered 2019-11-09 – 2019-11-10 (×3): 3.125 mg via ORAL
  Filled 2019-11-09 (×3): qty 1

## 2019-11-09 MED ORDER — ACETAMINOPHEN 325 MG PO TABS
650.0000 mg | ORAL_TABLET | ORAL | Status: DC | PRN
  Administered 2019-11-09 – 2019-11-10 (×2): 650 mg via ORAL
  Filled 2019-11-09 (×2): qty 2

## 2019-11-09 MED ORDER — PHENYTOIN SODIUM EXTENDED 100 MG PO CAPS
100.0000 mg | ORAL_CAPSULE | Freq: Two times a day (BID) | ORAL | Status: DC
  Administered 2019-11-09 – 2019-11-20 (×21): 100 mg via ORAL
  Filled 2019-11-09 (×27): qty 1

## 2019-11-09 MED ORDER — SODIUM CHLORIDE 0.9 % IV SOLN
250.0000 mL | INTRAVENOUS | Status: DC | PRN
  Administered 2019-11-13: 16:00:00 via INTRAVENOUS

## 2019-11-09 MED ORDER — POLYETHYLENE GLYCOL 3350 17 G PO PACK: 17.0000 g | PACK | Freq: Every day | ORAL | Status: DC | PRN

## 2019-11-09 MED ORDER — DIVALPROEX SODIUM ER 500 MG PO TB24
500.0000 mg | ORAL_TABLET | Freq: Every day | ORAL | Status: DC
  Administered 2019-11-09 – 2019-11-19 (×11): 500 mg via ORAL
  Filled 2019-11-09 (×12): qty 1

## 2019-11-09 MED ORDER — SODIUM CHLORIDE 0.9% FLUSH
3.0000 mL | Freq: Two times a day (BID) | INTRAVENOUS | Status: DC
  Administered 2019-11-09 – 2019-11-20 (×13): 3 mL via INTRAVENOUS

## 2019-11-09 MED ORDER — SODIUM CHLORIDE 0.9% FLUSH: 3.0000 mL | INTRAVENOUS | Status: DC | PRN

## 2019-11-09 MED ORDER — AMIODARONE HCL 200 MG PO TABS
200.0000 mg | ORAL_TABLET | Freq: Every day | ORAL | Status: DC
  Administered 2019-11-10 – 2019-11-20 (×10): 200 mg via ORAL
  Filled 2019-11-09 (×11): qty 1

## 2019-11-09 NOTE — Telephone Encounter (Signed)
Call returned to Pt's wife.  Advised Pt would receive a call when bed is ready.  Advised to answer the phone.  Advised Pt placement would tell them where to go when they call.  Wife indicates understanding.

## 2019-11-09 NOTE — Progress Notes (Signed)
[facility]. is a 73 y.o. male patient admitted from ED awake, alert - oriented  X 4 - no acute distress noted.  VSS - Blood pressure 109/80, pulse 85, temperature 98.4 F (36.9 C), temperature source Oral, resp. rate 18, height 5\' 11"  (1.803 m), weight 85.3 kg, SpO2 100 %.    IV in place, occlusive dsg intact without redness.  Orientation to room, and floor completed with information packet given to patient/family.  Patient declined safety video at this time.  Admission INP armband ID verified with patient/family, and in place.    SR up x 2, fall assessment complete, with patient and family able to verbalize understanding of risk associated with falls, and verbalized understanding to call nsg before up out of bed.    Call light within reach, patient able to voice, and demonstrate understanding. Skin to skin assessment completed with second RN. Skin to skin completed with second RN. Skin integrity documented; bruising documented and marked.  No evidence of skin break down noted on exam.   Will cont to eval and treat per MD orders.  Howard Pouch, RN 11/09/2019 3:36 PM

## 2019-11-09 NOTE — Progress Notes (Signed)
Old drainage on  4x4 dressing on right upper chest, tender to palpation during initial assessment.  Left upper chest and left mid back tender. Bruising left flank, tenderness to LLQ and some pain to inspiration and exhaling. Bruising marked.    Paged [facility] at [phone]. 7/10 pt. pain left chest, left flank (bruising)& back.Tender to touch. Pain med (stronger) in addition to Tylenol?  Paged Phylliss Bob NP at [phone]. please order covid swab for patient. patient is direct admission. COVID swab ordered  Paged Phylliss Bob at [phone]. Pt. has left flank bruising & tenderness;marked by RN. pain medicine in addition to Tylenol for flank pain? Spoke with [place] via phone; will order Tramadol, informed marked bruising and wanted to make aware, CBC ordered.   Patient wife Mardene Celeste stated via phone previously filled adv directive at chaplain's office a few years prior at [facility] and is the medical POA.

## 2019-11-10 DIAGNOSIS — D509 Iron deficiency anemia, unspecified: Secondary | ICD-10-CM

## 2019-11-10 DIAGNOSIS — T82837A Hemorrhage of cardiac prosthetic devices, implants and grafts, initial encounter: Secondary | ICD-10-CM

## 2019-11-10 DIAGNOSIS — D689 Coagulation defect, unspecified: Secondary | ICD-10-CM

## 2019-11-10 LAB — CBC
HCT: 30.8 % — ABNORMAL LOW (ref 39.0–52.0)
Hemoglobin: 9.7 g/dL — ABNORMAL LOW (ref 13.0–17.0)
MCH: 28.4 pg (ref 26.0–34.0)
MCHC: 31.5 g/dL (ref 30.0–36.0)
MCV: 90.1 fL (ref 80.0–100.0)
Platelets: 266 10*3/uL (ref 150–400)
RBC: 3.42 MIL/uL — ABNORMAL LOW (ref 4.22–5.81)
RDW: 17.9 % — ABNORMAL HIGH (ref 11.5–15.5)
WBC: 5.8 10*3/uL (ref 4.0–10.5)
nRBC: 0 % (ref 0.0–0.2)

## 2019-11-10 LAB — BASIC METABOLIC PANEL
Anion gap: 9 (ref 5–15)
BUN: 24 mg/dL — ABNORMAL HIGH (ref 8–23)
CO2: 23 mmol/L (ref 22–32)
Calcium: 8.2 mg/dL — ABNORMAL LOW (ref 8.9–10.3)
Chloride: 103 mmol/L (ref 98–111)
Creatinine, Ser: 0.9 mg/dL (ref 0.61–1.24)
GFR calc Af Amer: >60 mL/min (ref >60)
GFR calc non Af Amer: >60 mL/min (ref >60)
Glucose, Bld: 82 mg/dL (ref 70–99)
Potassium: 5.2 mmol/L — ABNORMAL HIGH (ref 3.5–5.1)
Sodium: 135 mmol/L (ref 135–145)

## 2019-11-10 LAB — TROPONIN I (HIGH SENSITIVITY): Troponin I (High Sensitivity): 6 ng/L (ref <18)

## 2019-11-10 LAB — APTT: aPTT: 49 seconds — ABNORMAL HIGH (ref 24–36)

## 2019-11-10 LAB — PROTIME-INR
INR: 1.2 (ref 0.8–1.2)
Prothrombin Time: 15.4 seconds — ABNORMAL HIGH (ref 11.4–15.2)

## 2019-11-10 LAB — FACTOR 7 ASSAY: Factor VII Activity: 126 % (ref 51–186)

## 2019-11-10 LAB — FACTOR 5 ASSAY: Factor V Activity: 62 % — ABNORMAL LOW (ref 70–150)

## 2019-11-10 MED ORDER — CEFAZOLIN SODIUM-DEXTROSE 1-4 GM/50ML-% IV SOLN
1.0000 g | Freq: Three times a day (TID) | INTRAVENOUS | Status: DC
  Administered 2019-11-10: 1 g via INTRAVENOUS
  Filled 2019-11-10 (×3): qty 50

## 2019-11-10 MED ORDER — SODIUM CHLORIDE 0.9 % IV SOLN
INTRAVENOUS | Status: DC | PRN
  Administered 2019-11-10 – 2019-11-12 (×2): 250 mL via INTRAVENOUS
  Administered 2019-11-13: 16:00:00 via INTRAVENOUS
  Administered 2019-11-13 – 2019-11-14 (×3): 1000 mL via INTRAVENOUS
  Administered 2019-11-15 – 2019-11-16 (×2): 250 mL via INTRAVENOUS
  Administered 2019-11-17 – 2019-11-18 (×2): 1000 mL via INTRAVENOUS

## 2019-11-10 MED ORDER — CEFAZOLIN SODIUM-DEXTROSE 2-4 GM/100ML-% IV SOLN
2.0000 g | Freq: Three times a day (TID) | INTRAVENOUS | Status: DC
  Administered 2019-11-10 – 2019-11-18 (×24): 2 g via INTRAVENOUS
  Filled 2019-11-10 (×30): qty 100

## 2019-11-10 NOTE — Progress Notes (Signed)
CCMD notified this RN about ST elevation. EKG performed. Pt denies any chest pain. Will pass on to day shift RN and continue to monitor.

## 2019-11-10 NOTE — Consult Note (Addendum)
[place]  [phone] Fax:([phone]) [place]  Referring MD:  Dr. [name]  Reason for Referral: Persistent hematoma/bleeding from ICD pocket  HPI: Louis Ford is a 73 year old male who has previously been seen by hematology for evaluation and management of a bleeding disorder and iron deficiency.  He has a past medical history significant for multiple medical comorbidities including atrial fibrillation, severe systolic congestive heart failure, history of CVA,?  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  On 09/10/2019, the patient underwent ICD generator change and developed a hematoma and oozing postoperatively.  He underwent pocket revision on 09/17/2019.  The patient presented to the cardiology office on 11/05/2019 with recurrent oozing and aches around the area for 3 days prior.  He was initially given outpatient antibiotics and was subsequently admitted on 11/09/2019 for work-up of possible infection and IV antibiotics.  He is planned for an ICD system extraction later this week.  The patient was last seen in our office on 09/24/2019.  Etiology of his bleeding disorder is not entirely clear.  His factor VII levels have been normal so likely not a factor VII deficiency.  Bleeding possibly caused by liver disease versus from his monoclonal paraproteinemia.  He was referred to [facility] for a second opinion, but has not yet been seen by them.  The patient reports some ongoing bleeding and discomfort at the right chest ICD pocket.  He reports left-sided flank pain today. He has not had any fever or chills recently. Denies headaches and dizziness.  Denies chest pain shortness of breath.  Denies abdominal pain, nausea, vomiting.  He has not noticed any epistaxis, bleeding gums, hemoptysis, hematemesis, hematuria, melena,  hematochezia.  Hematology was asked see the patient to make recommendations to minimize the risk of bleeding during his procedure.  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   GIB (gastrointestinal bleeding) 02/20/2017   History of MRSA infection 05/2011   HTN (hypertension)    Hx of adenomatous colonic polyps 02/20/2017   01/2017 - 3 cm sigmoid TV adenoma and other smaller polyps - had post-polypectomy bleed Tx w/ clips Consider repeat colonoscopy 3 yrs [name], MD, [facility]    Hyperlipidemia    implantable cardiac defibrillator-Biotronik    Device Implanted 2006; s/p gen change 03/2011 : bleeding persistent with pocket erosion and infection; explant and reimplant  06/2011   Ischemic cardiomyopathy    EF 15 to 20% by TTE and TEE in 09/2012.  Severe LV dysfunction   Obesity    BMI 31 in 09/2012   Persistent atrial fibrillation with rapid ventricular response ([place]) 04/21/2014   Retroperitoneal bleed 04/21/2019   Seizure disorder ([place]) latest 09/30/2012   Seizures ([place])    Stroke [facility])   :    Past Surgical History:  Procedure Laterality Date   ALVEOLOPLASTY N/A 07/26/2019   Procedure: ALVEOLOPLASTY WITH RESUTURING OF ORAL WOUND;  Surgeon: [name], [name];  Location: [facility] ORS;  Service: Oral Surgery;  Laterality: N/A;   CARDIAC CATHETERIZATION N/A 10/12/2015   Procedure: Right  Heart Cath;  Surgeon: [name], MD;  Location: [place] CV LAB;  Service: Cardiovascular;  Laterality: N/A;   CARDIOVERSION  09/24/2012   Procedure: CARDIOVERSION;  Surgeon: [name], MD;  Location: [facility] ENDOSCOPY;  Service: Cardiovascular;  Laterality: N/A;     CARDIOVERSION N/A 12/25/2017   Procedure: CARDIOVERSION;  Surgeon: [name], MD;  Location: [facility] ORS;  Service: Cardiovascular;  Laterality: N/A;   COLONOSCOPY W/ POLYPECTOMY  02/13/2017   CORONARY ARTERY BYPASS GRAFT  2011   in [place] N/A 08/12/2019   Procedure: [place];  Surgeon: [name], [name];  Location: [facility]S;  Service: Oral Surgery;  Laterality: N/A;   FLEXIBLE SIGMOIDOSCOPY N/A 02/20/2017   Procedure: FLEXIBLE SIGMOIDOSCOPY;  Surgeon: [name], MD;  Location: [facility] ENDOSCOPY;  Service: Endoscopy;  Laterality: N/A;   FLEXIBLE SIGMOIDOSCOPY N/A 02/21/2017   Procedure: FLEXIBLE SIGMOIDOSCOPY;  Surgeon: [name], MD;  Location: [facility] ENDOSCOPY;  Service: Endoscopy;  Laterality: N/A;   ICD     ICD GENERATOR CHANGEOUT N/A 09/10/2019   Procedure: [place];  Surgeon: [name], MD;  Location: [place] CV LAB;  Service: Cardiovascular;  Laterality: N/A;   POCKET REVISION/RELOCATION N/A 09/17/2019   Procedure: POCKET REVISION/RELOCATION;  Surgeon: Thompson Grayer, MD;  Location: [place] CV LAB;  Service: Cardiovascular;  Laterality: N/A;   SUBMANDIBULAR GLAND EXCISION N/A 08/08/2019   Procedure: suture of oral wounds;  Surgeon: [name], [name];  Location: [facility];  Service: Oral Surgery;  Laterality: N/A;   TEE WITHOUT CARDIOVERSION  09/24/2012   Procedure: TRANSESOPHAGEAL ECHOCARDIOGRAM (TEE);  Surgeon: [name], MD;  Location: [place];  Service: Cardiovascular;  Laterality: N/A;  dave/anesth, dl, cindy/echo   :   CURRENT MEDS: Current Facility-Administered Medications  Medication Dose Route Frequency Provider Last Rate Last Dose   0.9 %  sodium chloride infusion  250 mL Intravenous PRN Shirley Friar, PA-C       0.9 %  sodium chloride infusion   Intravenous PRN [name], MD 10 mL/hr at 11/10/19 0849 250 mL at 11/10/19 0849   acetaminophen (TYLENOL) tablet 650 mg  650 mg Oral Q4H  PRN Shirley Friar, PA-C   650 mg at 11/09/19 1553   amiodarone (PACERONE) tablet 200 mg  200 mg Oral Daily Shirley Friar, PA-C   200 mg at 11/10/19 9622   carvedilol (COREG) tablet 3.125 mg  3.125 mg Oral BID WC Shirley Friar, PA-C   3.125 mg at 11/10/19 2979   ceFAZolin (ANCEF) IVPB 1 g/50 mL premix  1 g Intravenous Q8H [name], MD 100 mL/hr at 11/10/19 1013 1 g at 11/10/19 1013   divalproex (DEPAKOTE ER) 24 hr tablet 500 mg  500 mg Oral QHS Shirley Friar, PA-C   500 mg at 11/09/19 2154   DULoxetine (CYMBALTA) DR capsule 30 mg  30 mg Oral Daily Shirley Friar, PA-C   30 mg at 11/10/19 8921   feeding supplement (ENSURE ENLIVE) (ENSURE ENLIVE) liquid 237 mL  237 mL Oral TID BM Shirley Friar, PA-C   237 mL at 11/09/19 2152   ferrous sulfate tablet 325 mg  325 mg Oral BID WC Shirley Friar, PA-C   325 mg at 11/10/19 1941   levETIRAcetam (KEPPRA) tablet 1,000 mg  1,000 mg Oral BID Shirley Friar, PA-C   1,000 mg at 11/10/19 0841   ondansetron (ZOFRAN) injection 4 mg  4 mg Intravenous Q6H PRN Shirley Friar, PA-C       phenytoin (DILANTIN) ER capsule 100 mg  100 mg Oral BID Shirley Friar, PA-C   100 mg at 11/10/19 1007   polyethylene glycol (MIRALAX / GLYCOLAX) packet 17 g  17 g Oral Daily PRN Shirley Friar, PA-C       simvastatin (ZOCOR) tablet 20 mg  20 mg Oral [id number] Shirley Friar, PA-C   20 mg at 11/09/19 1717   sodium chloride flush (NS) 0.9 % injection 3 mL  3 mL Intravenous Q12H Shirley Friar, PA-C   3 mL at 11/10/19 7741   sodium chloride flush (NS) 0.9 % injection 3 mL  3 mL Intravenous PRN Shirley Friar, PA-C       tamsulosin [facility]) capsule 0.4 mg  0.4 mg Oral Daily Shirley Friar, PA-C   0.4 mg at 11/10/19 2878   traMADol (ULTRAM) tablet 50 mg  50 mg Oral Q6H PRN Daune Perch, NP   50 mg at 11/10/19 6767      No Known  Allergies:  Family History  Problem Relation Age of Onset   Arthritis Mother    Heart disease Mother    Heart attack Mother    Other Father        smoker   Hypertension Neg Hx        unknown   Stroke Neg Hx        unknown  :  Social History   Socioeconomic History   Marital status: Married    Spouse name: Not on file   Number of children: Not on file   Years of education: Not on file   Highest education level: Not on file  Occupational History   Not on file  Social Needs   Financial resource strain: Not on file   Food insecurity    Worry: Not on file    Inability: Not on file   Transportation needs    Medical: Not on file    Non-medical: Not on file  Tobacco Use   Smoking status: Former Smoker    Types: Pipe    Quit date: 09/21/2008    Years since quitting: 11.1   Smokeless tobacco: Former Systems developer    Quit date: 09/21/2008  Substance and Sexual Activity   Alcohol use: No   Drug use: No   Sexual activity: Not Currently    Birth control/protection: None  Lifestyle   Physical activity    Days per week: Not on file    Minutes per session: Not on file   Stress: Not on file  Relationships   Social connections    Talks on phone: Not on file    Gets together: Not on file    Attends religious service: Not on file    Active member of club or organization: Not on file    Attends meetings of clubs or organizations: Not on file    Relationship status: Not on file   Intimate partner violence    Fear of current or ex partner: Not on file    Emotionally abused: Not on file    Physically abused: Not on file    Forced sexual activity: Not on file  Other Topics Concern   Not on file  Social History Narrative   ** Merged History Encounter **      :  REVIEW OF SYSTEMS: A comprehensive 14 point review of systems was negative except as noted in  the HPI.  Exam: Patient Vitals for the past 24 hrs:  BP Temp Temp src Pulse Resp SpO2 Height Weight    11/10/19 1134 119/78 98 F (36.7 C) Oral 78 16 100 % -- --  11/10/19 0739 117/82 98.2 F (36.8 C) Oral 84 16 100 % -- --  11/10/19 0402 119/79 98.1 F (36.7 C) Oral 78 18 100 % -- 187 lb 2.7 oz (84.9 kg)  11/10/19 0003 110/69 97.9 F (36.6 C) Oral 78 (!) 21 100 % -- --  11/09/19 2119 104/76 98.1 F (36.7 C) Oral 80 18 100 % -- --  11/09/19 1715 111/78 98 F (36.7 C) Oral 80 -- 100 % -- --  11/09/19 1434 109/80 98.4 F (36.9 C) Oral 85 18 100 % 5' 11"  (1.803 m) --  11/09/19 1429 -- -- -- -- -- -- -- 188 lb (85.3 kg)    General:  well-nourished in no acute distress.   Eyes:  no scleral icterus.   ENT:  There were no oropharyngeal lesions.   Lymphatics:  Negative cervical, supraclavicular or axillary adenopathy.  Respiratory: lungs were clear bilaterally without wheezing or crackles.  Cardiovascular:  Regular rate and rhythm, S1/S2, without murmur, rub or gallop.  There was no pedal edema.   GI:  abdomen was soft, flat, nontender, nondistended, without organomegaly.   Musculoskeletal: Pain to the left flank is reproducible with palpation.   Skin: No rashes or petechiae.  Right chest dressing in place.  The dressing was not removed however I was able to see dry blood on the gauze.  I did not see any signs of active bleeding. Neuro exam was nonfocal.  Patient was alert and oriented.  Attention was good.   Language was appropriate.  Mood was normal without depression.  Speech was not pressured.  Thought content was not tangential.    LABS:  Lab Results  Component Value Date   WBC 5.8 11/10/2019   HGB 9.7 (L) 11/10/2019   HCT 30.8 (L) 11/10/2019   PLT 266 11/10/2019   GLUCOSE 82 11/10/2019   CHOL 158 07/16/2017   TRIG 82.0 07/16/2017   HDL 51.90 07/16/2017   LDLCALC 90 07/16/2017   ALT 10 11/09/2019   AST 15 11/09/2019   NA 135 11/10/2019   K 5.2 (H) 11/10/2019   CL 103 11/10/2019   CREATININE 0.90 11/10/2019   BUN 24 (H) 11/10/2019   CO2 23 11/10/2019   PSA 7.61 (H)  11/07/2017   INR 1.2 11/10/2019   HGBA1C 5.2 07/25/2019    Ct Angio Chest Aorta W/cm &/or Wo/cm  Result Date: 10/29/2019 CLINICAL DATA:  73 year old male with left shoulder pain. Concern for aortic dissection. EXAM: CT ANGIOGRAPHY CHEST WITH CONTRAST TECHNIQUE: Multidetector CT imaging of the chest was performed using the standard protocol during bolus administration of intravenous contrast. Multiplanar CT image reconstructions and MIPs were obtained to evaluate the vascular anatomy. CONTRAST:  63m ISOVUE-370 IOPAMIDOL (ISOVUE-370) INJECTION 76% COMPARISON:  Chest CT dated 04/21/2019 FINDINGS: Evaluation is limited due to streak artifact caused by patient's arms. Cardiovascular: Mild dilatation of the right atrium. There is multi vessel coronary vascular calcification and postsurgical changes of CABG. Left pericardial calcification likely sequela of prior infection/inflammation. Right-sided pacemaker device noted. There is mild atherosclerotic calcification of the thoracic aorta. No aneurysmal dilatation or dissection. Evaluation of the pulmonary arteries is limited due to suboptimal opacification and timing of the contrast. No large or central pulmonary artery embolus identified. Mediastinum/Nodes: No hilar or mediastinal adenopathy. The  esophagus is grossly unremarkable. No mediastinal fluid collection. Lungs/Pleura: Left lung base linear atelectasis/scarring. There is no focal consolidation, pleural effusion, or pneumothorax. The central airways are patent. Upper Abdomen: Multiple bilateral renal cystic lesions, suboptimally characterized. There is atrophy of the body and tail of the pancreas. This findings are similar to prior CT. The pancreatic mass is not excluded but cannot be evaluated on this chest CT. Dedicated CT of the abdomen pelvis without and with contrast is recommended for better evaluation. Musculoskeletal: Median sternotomy wires. Degenerative changes of the spine. No acute osseous  pathology. Left pectoral intramuscular lipoma. Review of the MIP images confirms the above findings. IMPRESSION: 1. No acute intrathoracic pathology. No aortic dissection. CT evidence of central pulmonary artery embolus. 2. Aortic Atherosclerosis (ICD10-I70.0). 3. Atrophic appearance of the body and tail of the pancreas concerning for underlying pancreatic lesion. Dedicated CT of the abdomen pelvis recommended for better evaluation. Electronically Signed   By: Anner Crete M.D.   On: 10/29/2019 16:42    ASSESSMENT AND PLAN:   [facility]. is a 73 y.o. male with multiple medical co-morbidities included afib, severe systolic CHF, CVA with ?? Mlid factor VII deficiency with previous post-op bleeding issues with pacemaker placement, TEE. No issues with spontaneous bleeding at baseline.  1) ICD pocket hematoma Previous factor VII level 69% which is within normal limits Platelet function assay within normal limits Prior PT with slight elevation and PTT elevated Prior PT and PTT mixing studies do not show any concern for factor inhibitor Thrombin time, von Willebrand panel, factor XIII, factor II, and X all within normal limits Factor V levels have been slightly low in the past concerning for liver disease There has been concern for possible C PVC of liver and possible related coagulopathy causing the mild elevation of the PT and PTT Also possibility of dysfibrinogenemia He is not on anticoagulation for A. fib due to significant bleeding issues  -Repeat PT today mildly elevated at 15.4 with a normal INR 1.2, PTT elevated at 49 -Repeat PT and PTT mixing studies are pending -Repeat thrombin time pending -Repeat factor V and factor VII assays are pending -Etiology of his bleeding disorder has not been identified and a second opinion is still pending at [facility] -Discussed current medications. Pt advised that he is taking cymbalta for depression. Discussed that cymbalta can affect platelet  function and to follow up with PCP to see if medication can be altered. -He has surgical procedure planned for later this week.  If surgery is nonurgent, would recommend postponing until after [facility] has provided their input.  However, if this procedure is deemed emergent, proceed with use of FFP and cryoprecipitate.  2) iron deficiency anemia He remains on oral iron 325 mg twice daily Most recent ferritin on 09/24/2019 was normal at 122 Hemoglobin today is 9.7 and is slowly drifting down secondary to bleeding  -Recommend supportive PRBC transfusions for hemoglobin less than 8  3) IgG Kappa monoclonal paraproteinemia  -Advised of the option to take a conservative approach to monitor protein levels to insure that he does not have multiple myeloma.  Thank you for this referral.  Louis Bussing, DNP, AGPCNP-BC, AOCNP    ADDENDUM  .Patient was Personally and independently interviewed, examined and relevant elements of the history of present illness were reviewed in details and an assessment and plan was created. All elements of the patient's history of present illness , assessment and plan were discussed in details with Louis Downer  Curcio, DNP. The above documentation reflects our combined findings assessment and plan.  Component     Latest Ref Rng & Units 11/10/2019  Prothrombin Time     11.4 - 15.2 seconds 15.4 (H)  INR     0.8 - 1.2 1.2  APTT     24 - 36 seconds 49 (H)  Factor VII Activity     51 - 186 % 126  Factor V Activity     70 - 150 % 62 (L)    Component     Latest Ref Rng & Units 07/22/2019 07/29/2019 07/31/2019  APTT     22.9 - 30.2 sec   35.8 (H)  aPTT 1:1 Normal Plasma     22.9 - 30.2 sec   32.4 (H)  aPTT 1:1 Mix Saline        NOT PERFORMED  aPTT 1:1 NP Mix, 60 Min,Incub.     22.9 - 30.2 sec   35.0 (H)  aPTT 1:1 NP Incub. Mix Ctl     22.9 - 30.2 sec   33.0 (H)  Coagulation Factor VIII     56 - 140 %  182 (H)   Ristocetin Co-factor, Plasma     50 - 200 %   91   Von Willebrand Antigen, Plasma     50 - 200 %  117   PT     9.6 - 11.5 sec   12.5 (H)  PT 1:1NP     9.6 - 11.5 sec   10.9  1 HR INCUB PT 1:1NP     9.6 - 11.5 sec   11.4  PFA Interpretation          Collagen / Epinephrine     0 - 193 seconds  132   Factor VII Activity     51 - 186 % 69    Factor XIII, Qualitative     No Lysis/24  NORMAL   Factor X Activity     76 - 183 %   95  Factor V Activity     70 - 150 %   64 (L)  Factor II Activity     50 - 154 %   82   Component     Latest Ref Rng & Units 11/10/2019  APTT     22.9 - 30.2 sec   aPTT 1:1 Normal Plasma     22.9 - 30.2 sec   aPTT 1:1 Mix Saline        aPTT 1:1 NP Mix, 60 Min,Incub.     22.9 - 30.2 sec   aPTT 1:1 NP Incub. Mix Ctl     22.9 - 30.2 sec   Coagulation Factor VIII     56 - 140 %   Ristocetin Co-factor, Plasma     50 - 200 %   Von Willebrand Antigen, Plasma     50 - 200 %   PT     9.6 - 11.5 sec   PT 1:1NP     9.6 - 11.5 sec   1 HR INCUB PT 1:1NP     9.6 - 11.5 sec   PFA Interpretation        Collagen / Epinephrine     0 - 193 seconds   Factor VII Activity     51 - 186 % 126  Factor XIII, Qualitative     No Lysis/24   Factor X Activity     76 - 183 %   Factor V Activity  70 - 150 % 62 (L)  Factor II Activity     50 - 154 %    A- Patient has a complex coagulopathy with very confusing and contrary coagulopathy work-up. He was previously deemed to have factor VII deficiency however factor VII levels currently are within normal limits and prothrombin time are near normal. Has elevation of APTT and decrease factor V levels which are suggestive of coagulopathy from chronic liver disease. Has previously had incomplete correction with PTT mixing study with time dependent prolongation with incubation.  This would have been concerning for a factor VIII inhibitor however his Factor VIII levels done previously were within normal limits. Repeat PTT mixing study is currently pending.  APTT  elevated at 49. Von Willebrand factor levels are within normal limits.  Plan -Patient has an indeterminate coagulopathy for which she was previously referred to [facility] but has not been able to follow-up.  He has had issues with bleeding after dental surgery-controlled with Amicar washes and local dental interventions. -He has had previous ICD implantation related bleeding and pocket hematoma controlled with local interventions. -For elective procedures if delay is possible would recommend pursuing his complex coagulopathy evaluation at [facility] prior to his procedure. 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  This is characterized by his low factor V levels.  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  Cannot rule out the possibility of bleeding disorder related to his monoclonal paraproteinemia (though his previous thrombin times have been normal). -If the patient cannot delay the procedure would recommend empiric preoperative treatment with FFP 5 mL/kg (preferred 10 mL/kg if possible though his congestive heart failure might be limiting due to fluid overload issues) + 5 units of cryoppt. -Will need to optimize local hemostasis with local interventions such as pressure dressing possible use of thrombin gel etc. -If the patient has life-threatening bleeding postoperatively despite above intervention and optimizing local hemostatic considerations might empirically have to use aPCC -however given his history of A. fib and CVA and CHF he would be at high risk for thrombotic complications.   Louis Lone MD MS

## 2019-11-10 NOTE — Progress Notes (Addendum)
Electrophysiology Rounding Note  Patient Name: [facility]. Date of Encounter: 11/10/2019   Electrophysiologist: Lovena Le   Subjective   The patient is doing ok today. He has back and flank pain.   Inpatient Medications    Scheduled Meds: . amiodarone  200 mg Oral Daily  . carvedilol  3.125 mg Oral BID WC  . divalproex  500 mg Oral QHS  . DULoxetine  30 mg Oral Daily  . feeding supplement (ENSURE ENLIVE)  237 mL Oral TID BM  . ferrous sulfate  325 mg Oral BID WC  . levETIRAcetam  1,000 mg Oral BID  . phenytoin  100 mg Oral BID  . simvastatin  20 mg Oral q1800  . sodium chloride flush  3 mL Intravenous Q12H  . tamsulosin  0.4 mg Oral Daily   Continuous Infusions: . sodium chloride    . sodium chloride 250 mL (11/10/19 0849)  .  ceFAZolin (ANCEF) IV     PRN Meds: sodium chloride, sodium chloride, acetaminophen, ondansetron (ZOFRAN) IV, polyethylene glycol, sodium chloride flush, traMADol   Vital Signs    Vitals:   11/09/19 2119 11/10/19 0003 11/10/19 0402 11/10/19 0739  BP: 104/76 110/69 119/79 117/82  Pulse: 80 78 78 84  Resp: 18 (!) 21 18 16   Temp: 98.1 F (36.7 C) 97.9 F (36.6 C) 98.1 F (36.7 C) 98.2 F (36.8 C)  TempSrc: Oral Oral Oral Oral  SpO2: 100% 100% 100% 100%  Weight:   84.9 kg   Height:        Intake/Output Summary (Last 24 hours) at 11/10/2019 0924 Last data filed at 11/10/2019 0843 Gross per 24 hour  Intake 643 ml  Output 925 ml  Net -282 ml   Filed Weights   11/09/19 1429 11/10/19 0402  Weight: 85.3 kg 84.9 kg    Physical Exam    GEN- The patient is chronically ill appearing, alert and oriented x 3 today.   Head- normocephalic, atraumatic Eyes-  Sclera clear, conjunctiva pink Ears- hearing intact Oropharynx- clear Neck- supple Lungs- Clear to ausculation bilaterally, normal work of breathing Heart- Regular rate and rhythm  GI- soft, NT, ND, + BS Extremities- no clubbing, cyanosis, or edema Skin- no rash or lesion  Psych- euthymic mood, full affect Neuro- strength and sensation are intact  Labs    CBC Recent Labs    11/09/19 1440 11/10/19 0343  WBC 6.2 5.8  NEUTROABS 4.0  --   HGB 10.1* 9.7*  HCT 32.7* 30.8*  MCV 91.3 90.1  PLT 286 123456   Basic Metabolic Panel Recent Labs    11/09/19 1440 11/10/19 0343  NA 136 135  K 3.8 5.2*  CL 102 103  CO2 24 23  GLUCOSE 89 82  BUN 19 24*  CREATININE 0.94 0.90  CALCIUM 8.5* 8.2*  MG 1.5*  --    Liver Function Tests Recent Labs    11/09/19 1440  AST 15  ALT 10  ALKPHOS 100  BILITOT 1.2  PROT 9.1*  ALBUMIN 2.6*     Telemetry    SR (personally reviewed)  Radiology    No results found.   Assessment & Plan    1.  ICD pocket hematoma with draining concerning for device infection Blood cultures pending (but were drawn after Keflex given over the weekend) Will arrange TEE with anesthesia for Thursday morning I have contacted [facility] to arrange for extraction on Friday in [place].  Discussed with Dr Marin Olp yesterday, formal hematology consult pending. There is  concern for bleeding with device extraction - his recommendation was for FFP prior to procedure. Will await formal consult and recommendations Continue IV antibiotics for now   For questions or updates, please contact [place] Please consult [email or web address] for contact info under Cardiology/STEMI.  Signed, Chanetta Marshall, NP  11/10/2019, 9:24 AM   EP attending  Patient seen and examined.  Agree with the findings as noted above.  The patient's ICD pocket continues to drain.  He is not toxic appearing.  I am concerned about his back and side pain.  I anticipate transesophageal echo to rule out evidence of any lead infection, specifically vegetations on the lead.  We will tentatively plan ICD pocket exploration with possible extraction on Friday.  He will require at least FFP prior to the procedure.  For now we will continue intravenous antibiotics.  [name], MD

## 2019-11-10 NOTE — Progress Notes (Signed)
RN present during Lovena Le MD morning rounds, RN informed and showed MD left flank bruising.   Spoke with patient's wife via phone as patient had some difficulty recalling/communicating info shared by MD. Informed wife Lovena Le MD stated will call wife with updates. Informed patient will now receive IV ABX and oral ABX have been discontinued at this time. Informed lab work is done to provide more info about patient's status for care team to review and determine care plan. Patient informed wife MD stated he may need procedure to look at his heart (TEE), clarified to wife that MD mentioned possible TEE, however, that has not been ordered and will need to discuss procedure with MD.  Louis Ford, reassessed right upper chest dressing over ICD placement. Dressing intact with moderate serous drainage on 4x4 gauze; dressing changed. Left flank bruising still as marked on 11/16.

## 2019-11-10 NOTE — Progress Notes (Signed)
Brief Hematology Note:  Aware of patient consult. Will see patient later today when rounding at  [facility]. Full note to follow.   Mikey Bussing, DNP, AGPCNP-BC, AOCNP

## 2019-11-10 NOTE — TOC Initial Note (Signed)
Transition of Care (TOC) - Initial/Assessment Note    Patient Details  Name: [facility] MRN: [id number] Date of Birth: 08-09-1946  Transition of Care [facility]) CM/SW Contact:    Zenon Mayo, RN Phone Number: 11/10/2019, 10:26 AM  Clinical Narrative:                 From home with wife, patient informes NCM to call his spouse.  NCM contacted Mardene Celeste ,she states he is active with [facility] for HHPT/HHOT, NCM asked if they would like to have HHRN added to this,she states yes and would like to continue with [facility].  NCM contacted Tiffany with [facility] and informed her to add [facility] , she states she will let the office know to add.  SOC will begin 24 to 48 hrs post dc.  TOC team will continue to follow for dc needs.   Expected Discharge Plan: [place] Barriers to Discharge: No Barriers Identified   Patient Goals and CMS Choice Patient states their goals for this hospitalization and ongoing recovery are:: get better CMS Medicare.gov Compare Post Acute Care list provided to:: Patient Represenative (must comment)(wife) Choice offered to / list presented to : Spouse  Expected Discharge Plan and Services Expected Discharge Plan: [place] In-house Referral: NA Discharge Planning Services: CM Consult Post Acute Care Choice: [place] arrangements for the past 2 months: Apartment                 DME Arranged: (NA)         HH Arranged: RN, PT, OT HH Agency: [facility] (formerly [employer]) Date [place]: 11/10/19 Time [place]: 101 Representative spoke with at [place]: [place] Arrangements/Services Living arrangements for the past 2 months: [place] with:: Spouse Patient language and need for interpreter reviewed:: Yes Do you feel safe going back to the place where you live?: Yes      Need for Family Participation in Patient Care: Yes (Comment) Care giver support system in  place?: Yes (comment) Current home services: Home OT, Home PT(with [facility]) Criminal Activity/Legal Involvement Pertinent to Current Situation/Hospitalization: No - Comment as needed  Activities of Daily Living Home Assistive Devices/Equipment: Cane (specify quad or straight), Walker (specify type)(straight cane, walker has seat) ADL Screening (condition at time of admission) Patient's cognitive ability adequate to safely complete daily activities?: Yes Is the patient deaf or have difficulty hearing?: No Does the patient have difficulty seeing, even when wearing glasses/contacts?: No Does the patient have difficulty concentrating, remembering, or making decisions?: No Patient able to express need for assistance with ADLs?: Yes Does the patient have difficulty dressing or bathing?: No Independently performs ADLs?: Yes (appropriate for developmental age) Does the patient have difficulty walking or climbing stairs?: No Weakness of Legs: Both(works with home PT) Weakness of Arms/Hands: None  Permission Sought/Granted                  Emotional Assessment Appearance:: Appears stated age Attitude/Demeanor/Rapport: Engaged Affect (typically observed): Appropriate Orientation: : Oriented to Self, Oriented to Place, Oriented to  Time, Oriented to Situation Alcohol / Substance Use: Not Applicable Psych Involvement: No (comment)  Admission diagnosis:  Hematoma of inplanted cardiac defibrillator Patient Active Problem List   Diagnosis Date Noted  . ICD (implantable cardioverter-defibrillator) pocket hematoma, subsequent encounter 11/09/2019  . Hematoma 10/29/2019  . Upper back pain 10/29/2019  . Nonischemic cardiomyopathy ([place]) 10/22/2019  . ICD (  implantable cardioverter-defibrillator) pocket hematoma 09/17/2019  . Complications due to automatic implantable cardioverter-defibrillator   . Hospice care patient 08/05/2019  . Palliative care patient 08/05/2019  . Oral bleeding   .  Coagulopathy ([place])   . Anemia due to blood loss, acute 07/20/2019  . Retroperitoneal bleed 04/21/2019  . Retroperitoneal hematoma   . Right arm pain 11/26/2018  . Hematoma of chest wall 03/26/2018  . Leg pain 03/04/2018  . 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 Elevated TSH 10/27/2017  . Weakness of both lower extremities 10/27/2017  . Unsteady gait 10/27/2017  . Localization-related symptomatic epilepsy and epileptic syndromes with complex partial seizures, not intractable, without status epilepticus ([place]) 10/08/2017  . Insomnia 09/02/2017  . Degenerative joint disease of knee, left 08/01/2017  . Blood loss anemia 04/18/2017  . Hx of adenomatous colonic polyps 02/20/2017  . Chronic pain of left knee 01/22/2017  . Chronic kidney disease (CKD), stage III (moderate) ([place]) 11/08/2016  . Hypertensive heart disease without heart failure   . Chronic chest wall pain 08/17/2016  . Periodontal disease 05/18/2016  . HLD (hyperlipidemia) 02/26/2016  . CVA (cerebral infarction) 02/26/2016  . Near syncope 02/26/2016  . Gout 12/30/2015  . Persistent atrial fibrillation with rapid ventricular response ([place]) 04/21/2014  . Chronic systolic CHF (congestive heart failure), NYHA class 2 ([place]) 11/06/2012  . 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 Seizure ([place]) 10/01/2012  . Renal insufficiency 10/01/2012  . Ischemic cardiomyopathy  ? additional rate component 09/29/2012  . Automatic implantable cardioverter-defibrillator in situ   . HTN (hypertension) 09/21/2012  . Coronary artery disease involving native heart 09/21/2012   PCP:  [name], MD Pharmacy:   [facility], [address] [place] [address] Phone: [phone] Fax: [phone]     Social Determinants of Health (SDOH) Interventions    Readmission Risk Interventions Readmission Risk Prevention Plan 08/07/2019  Transportation Screening Complete   PCP or Specialist Appt within 3-5 Days Complete  HRI or [place] Not Complete  HRI or Home Care Consult comments To SNF  Social Work Consult for [place] Planning/Counseling Not Complete  SW consult not completed comments NA  Palliative Care Screening Not Applicable  Medication Review (RN Care Manager) Complete  Some recent data might be hidden

## 2019-11-11 ENCOUNTER — Encounter (HOSPITAL_COMMUNITY): Payer: Self-pay | Admitting: General Practice

## 2019-11-11 ENCOUNTER — Other Ambulatory Visit: Payer: Self-pay

## 2019-11-11 ENCOUNTER — Telehealth: Payer: Self-pay | Admitting: *Deleted

## 2019-11-11 LAB — BASIC METABOLIC PANEL
Anion gap: 5 (ref 5–15)
BUN: 15 mg/dL (ref 8–23)
CO2: 25 mmol/L (ref 22–32)
Calcium: 8.3 mg/dL — ABNORMAL LOW (ref 8.9–10.3)
Chloride: 104 mmol/L (ref 98–111)
Creatinine, Ser: 0.93 mg/dL (ref 0.61–1.24)
GFR calc Af Amer: >60 mL/min (ref >60)
GFR calc non Af Amer: >60 mL/min (ref >60)
Glucose, Bld: 79 mg/dL (ref 70–99)
Potassium: 3.8 mmol/L (ref 3.5–5.1)
Sodium: 134 mmol/L — ABNORMAL LOW (ref 135–145)

## 2019-11-11 LAB — MISC LABCORP TEST (SEND OUT): Labcorp test code: 15230

## 2019-11-11 LAB — FIBRINOGEN: Fibrinogen: 455 mg/dL (ref 210–475)

## 2019-11-11 LAB — THROMBIN TIME: Thrombin Time: 18.3 s (ref 0.0–23.0)

## 2019-11-11 MED ORDER — INFLUENZA VAC A&B SA ADJ QUAD 0.5 ML IM PRSY
0.5000 mL | PREFILLED_SYRINGE | INTRAMUSCULAR | Status: DC
  Filled 2019-11-11: qty 0.5

## 2019-11-11 MED ORDER — FERROUS SULFATE 325 (65 FE) MG PO TABS
325.0000 mg | ORAL_TABLET | Freq: Two times a day (BID) | ORAL | Status: DC
  Administered 2019-11-11 – 2019-11-20 (×18): 325 mg via ORAL
  Filled 2019-11-11 (×18): qty 1

## 2019-11-11 MED ORDER — PHYTONADIONE 5 MG PO TABS
5.0000 mg | ORAL_TABLET | Freq: Every day | ORAL | Status: AC
  Administered 2019-11-11 – 2019-11-15 (×4): 5 mg via ORAL
  Filled 2019-11-11 (×5): qty 1

## 2019-11-11 MED ORDER — CARVEDILOL 3.125 MG PO TABS
3.1250 mg | ORAL_TABLET | Freq: Two times a day (BID) | ORAL | Status: DC
  Administered 2019-11-11 – 2019-11-20 (×18): 3.125 mg via ORAL
  Filled 2019-11-11 (×18): qty 1

## 2019-11-11 MED ORDER — SODIUM CHLORIDE 0.9 % IV SOLN
INTRAVENOUS | Status: DC
  Administered 2019-11-12 (×2): via INTRAVENOUS

## 2019-11-11 NOTE — Progress Notes (Addendum)
Electrophysiology Rounding Note  Patient Name: [facility]. Date of Encounter: 11/11/2019  Primary Cardiologist: Dr. Haroldine Laws  Electrophysiologist: Dr. Lovena Le   Subjective   The patient is feeling OK this am. He says his site remains sore.   Inpatient Medications    Scheduled Meds: . amiodarone  200 mg Oral Daily  . carvedilol  3.125 mg Oral BID WC  . divalproex  500 mg Oral QHS  . DULoxetine  30 mg Oral Daily  . feeding supplement (ENSURE ENLIVE)  237 mL Oral TID BM  . ferrous sulfate  325 mg Oral BID WC  . levETIRAcetam  1,000 mg Oral BID  . phenytoin  100 mg Oral BID  . simvastatin  20 mg Oral q1800  . sodium chloride flush  3 mL Intravenous Q12H  . tamsulosin  0.4 mg Oral Daily   Continuous Infusions: . sodium chloride    . sodium chloride 10 mL/hr at 11/10/19 1458  .  ceFAZolin (ANCEF) IV 2 g (11/11/19 0640)   PRN Meds: sodium chloride, sodium chloride, acetaminophen, ondansetron (ZOFRAN) IV, polyethylene glycol, sodium chloride flush, traMADol   Vital Signs    Vitals:   11/10/19 1134 11/10/19 1628 11/10/19 2034 11/11/19 0556  BP: 119/78 113/78 109/77   Pulse: 78 85 77   Resp: 16 18 (!) 21   Temp: 98 F (36.7 C) 98.2 F (36.8 C) 98 F (36.7 C)   TempSrc: Oral Oral Oral   SpO2: 100% 100% 97%   Weight:    85 kg  Height:        Intake/Output Summary (Last 24 hours) at 11/11/2019 0731 Last data filed at 11/11/2019 0557 Gross per 24 hour  Intake 768.22 ml  Output 1150 ml  Net -381.78 ml   Filed Weights   11/09/19 1429 11/10/19 0402 11/11/19 0556  Weight: 85.3 kg 84.9 kg 85 kg    Physical Exam    GEN- The patient is chronically ill appearing, alert and oriented x 3 today.   Head- normocephalic, atraumatic Eyes-  Sclera clear, conjunctiva pink Ears- hearing intact Oropharynx- clear Neck- supple Lungs- Clear to ausculation bilaterally, normal work of breathing, minimal drainage is present on bandage/wound. Heart- Regular rate and rhythm,  no murmurs, rubs or gallops GI- soft, NT, ND, + BS Extremities- no clubbing, cyanosis, or edema Skin- no rash or lesion Psych- euthymic mood, full affect Neuro- strength and sensation are intact  Labs    CBC Recent Labs    11/09/19 1440 11/10/19 0343  WBC 6.2 5.8  NEUTROABS 4.0  --   HGB 10.1* 9.7*  HCT 32.7* 30.8*  MCV 91.3 90.1  PLT 286 123456   Basic Metabolic Panel Recent Labs    11/09/19 1440 11/10/19 0343 11/11/19 0451  NA 136 135 134*  K 3.8 5.2* 3.8  CL 102 103 104  CO2 24 23 25   GLUCOSE 89 82 79  BUN 19 24* 15  CREATININE 0.94 0.90 0.93  CALCIUM 8.5* 8.2* 8.3*  MG 1.5*  --   --    Liver Function Tests Recent Labs    11/09/19 1440  AST 15  ALT 10  ALKPHOS 100  BILITOT 1.2  PROT 9.1*  ALBUMIN 2.6*   No results for input(s): LIPASE, AMYLASE in the last 72 hours. Cardiac Enzymes No results for input(s): CKTOTAL, CKMB, CKMBINDEX, TROPONINI in the last 72 hours.   Telemetry    NSR 80-90s with PVCs (personally reviewed)  Radiology    No results found.  Patient Profile     [facility]. is a 73 y.o. male with history of chronic systolic CHF, ICM, CAD, h/o spontaneous retroperitoneal bleed, ICD with history of appropriate therapies for VT, and h/o ICD complications including extraction of a left sided device due to "bleeding" per patient. Admitted for pocket hematoma with drainage and likely infection.  Assessment & Plan    1. ICD pocket hematoma with draining concerning for device infection Blood cultures no growth x 24 hours, pending today. Plan for TEE with anesthesia tomorrow [facility] has been contacted to arrange for extraction in OR on Friday. Will confirm. Hematology recommends input from [facility], and possibly postponing until then if non-urgent. If plan to proceed, recommended to use FFP and cryoprecipitate during procedure per DNP. MD Cosign/recommendations on that note pending. Continue IV ABX.  For questions or updates, please  contact [place] Please consult [email or web address] for contact info under Cardiology/STEMI.  Signed, Shirley Friar, PA-C  11/11/2019, 7:31 AM   EP Attending  Patient seen and examined. I have reached out to his wife as well to give her a plan for his care. Mr. Jaskolski continues to ooze from his surgical site, now many after pocket revision. We plan TEE tomorrow and device pocket exploration with likely system extraction. His surgery is not emergent but is urgent and transfer to [facility] is not appropriate for diagnostic reasons unless the hematology service feels that his bleeding risk despite FFP and cryoprecipitate is very high and they think that the hematologist at [facility] will be able to provide treatment that will substantially lower his risk of bleeding. If this is the case then we will cancel surgery.   Zoya Sprecher,M.D.  Mikle Bosworth.D.

## 2019-11-11 NOTE — Plan of Care (Signed)

## 2019-11-11 NOTE — Plan of Care (Deleted)
  Problem: Education: Goal: Knowledge of General Education information will improve Description: Including pain rating scale, medication(s)/side effects and non-pharmacologic comfort measures 11/11/2019 1832 by Beckey Rutter, RN Outcome: Progressing 11/11/2019 1832 by Beckey Rutter, RN Outcome: Progressing   Problem: Health Behavior/Discharge Planning: Goal: Ability to manage health-related needs will improve 11/11/2019 1832 by Beckey Rutter, RN Outcome: Progressing 11/11/2019 1832 by Beckey Rutter, RN Outcome: Progressing   Problem: Clinical Measurements: Goal: Ability to maintain clinical measurements within normal limits will improve 11/11/2019 1832 by Beckey Rutter, RN Outcome: Progressing 11/11/2019 1832 by Beckey Rutter, RN Outcome: Progressing Goal: Will remain free from infection 11/11/2019 1832 by Beckey Rutter, RN Outcome: Progressing 11/11/2019 1832 by Beckey Rutter, RN Outcome: Progressing Goal: Diagnostic test results will improve 11/11/2019 1832 by Beckey Rutter, RN Outcome: Progressing 11/11/2019 1832 by Beckey Rutter, RN Outcome: Progressing Goal: Respiratory complications will improve 11/11/2019 1832 by Beckey Rutter, RN Outcome: Progressing 11/11/2019 1832 by Beckey Rutter, RN Outcome: Progressing Goal: Cardiovascular complication will be avoided 11/11/2019 1832 by Beckey Rutter, RN Outcome: Progressing 11/11/2019 1832 by Beckey Rutter, RN Outcome: Progressing   Problem: Activity: Goal: Risk for activity intolerance will decrease 11/11/2019 1832 by Beckey Rutter, RN Outcome: Progressing 11/11/2019 1832 by Beckey Rutter, RN Outcome: Progressing   Problem: Nutrition: Goal: Adequate nutrition will be maintained 11/11/2019 1832 by Beckey Rutter, RN Outcome: Progressing 11/11/2019 1832 by Beckey Rutter, RN Outcome: Progressing   Problem: Coping: Goal: Level of anxiety will decrease 11/11/2019 1832 by Beckey Rutter, RN Outcome: Progressing 11/11/2019 1832 by Beckey Rutter,  RN Outcome: Progressing   Problem: Elimination: Goal: Will not experience complications related to bowel motility 11/11/2019 1832 by Beckey Rutter, RN Outcome: Progressing 11/11/2019 1832 by Beckey Rutter, RN Outcome: Progressing Goal: Will not experience complications related to urinary retention 11/11/2019 1832 by Beckey Rutter, RN Outcome: Progressing 11/11/2019 1832 by Beckey Rutter, RN Outcome: Progressing   Problem: Pain Managment: Goal: General experience of comfort will improve 11/11/2019 1832 by Beckey Rutter, RN Outcome: Progressing 11/11/2019 1832 by Beckey Rutter, RN Outcome: Progressing   Problem: Safety: Goal: Ability to remain free from injury will improve 11/11/2019 1832 by Beckey Rutter, RN Outcome: Progressing 11/11/2019 1832 by Beckey Rutter, RN Outcome: Progressing   Problem: Skin Integrity: Goal: Risk for impaired skin integrity will decrease 11/11/2019 1832 by Beckey Rutter, RN Outcome: Progressing 11/11/2019 1832 by Beckey Rutter, RN Outcome: Progressing

## 2019-11-11 NOTE — H&P (View-Only) (Signed)
Electrophysiology Rounding Note  Patient Name: Louis Ford. Date of Encounter: 11/11/2019  Primary Cardiologist: Dr. Haroldine Laws  Electrophysiologist: Dr. Lovena Le   Subjective   The patient is feeling OK this am. He says his site remains sore.   Inpatient Medications    Scheduled Meds: . amiodarone  200 mg Oral Daily  . carvedilol  3.125 mg Oral BID WC  . divalproex  500 mg Oral QHS  . DULoxetine  30 mg Oral Daily  . feeding supplement (ENSURE ENLIVE)  237 mL Oral TID BM  . ferrous sulfate  325 mg Oral BID WC  . levETIRAcetam  1,000 mg Oral BID  . phenytoin  100 mg Oral BID  . simvastatin  20 mg Oral q1800  . sodium chloride flush  3 mL Intravenous Q12H  . tamsulosin  0.4 mg Oral Daily   Continuous Infusions: . sodium chloride    . sodium chloride 10 mL/hr at 11/10/19 1458  .  ceFAZolin (ANCEF) IV 2 g (11/11/19 0640)   PRN Meds: sodium chloride, sodium chloride, acetaminophen, ondansetron (ZOFRAN) IV, polyethylene glycol, sodium chloride flush, traMADol   Vital Signs    Vitals:   11/10/19 1134 11/10/19 1628 11/10/19 2034 11/11/19 0556  BP: 119/78 113/78 109/77   Pulse: 78 85 77   Resp: 16 18 (!) 21   Temp: 98 F (36.7 C) 98.2 F (36.8 C) 98 F (36.7 C)   TempSrc: Oral Oral Oral   SpO2: 100% 100% 97%   Weight:    85 kg  Height:        Intake/Output Summary (Last 24 hours) at 11/11/2019 0731 Last data filed at 11/11/2019 0557 Gross per 24 hour  Intake 768.22 ml  Output 1150 ml  Net -381.78 ml   Filed Weights   11/09/19 1429 11/10/19 0402 11/11/19 0556  Weight: 85.3 kg 84.9 kg 85 kg    Physical Exam    GEN- The patient is chronically ill appearing, alert and oriented x 3 today.   Head- normocephalic, atraumatic Eyes-  Sclera clear, conjunctiva pink Ears- hearing intact Oropharynx- clear Neck- supple Lungs- Clear to ausculation bilaterally, normal work of breathing, minimal drainage is present on bandage/wound. Heart- Regular rate and rhythm,  no murmurs, rubs or gallops GI- soft, NT, ND, + BS Extremities- no clubbing, cyanosis, or edema Skin- no rash or lesion Psych- euthymic mood, full affect Neuro- strength and sensation are intact  Labs    CBC Recent Labs    11/09/19 1440 11/10/19 0343  WBC 6.2 5.8  NEUTROABS 4.0  --   HGB 10.1* 9.7*  HCT 32.7* 30.8*  MCV 91.3 90.1  PLT 286 123456   Basic Metabolic Panel Recent Labs    11/09/19 1440 11/10/19 0343 11/11/19 0451  NA 136 135 134*  K 3.8 5.2* 3.8  CL 102 103 104  CO2 24 23 25   GLUCOSE 89 82 79  BUN 19 24* 15  CREATININE 0.94 0.90 0.93  CALCIUM 8.5* 8.2* 8.3*  MG 1.5*  --   --    Liver Function Tests Recent Labs    11/09/19 1440  AST 15  ALT 10  ALKPHOS 100  BILITOT 1.2  PROT 9.1*  ALBUMIN 2.6*   No results for input(s): LIPASE, AMYLASE in the last 72 hours. Cardiac Enzymes No results for input(s): CKTOTAL, CKMB, CKMBINDEX, TROPONINI in the last 72 hours.   Telemetry    NSR 80-90s with PVCs (personally reviewed)  Radiology    No results found.  Patient Profile     [facility] is a 73 y.o. male with history of chronic systolic CHF, ICM, CAD, h/o spontaneous retroperitoneal bleed, ICD with history of appropriate therapies for VT, and h/o ICD complications including extraction of a left sided device due to "bleeding" per patient. Admitted for pocket hematoma with drainage and likely infection.  Assessment & Plan    1. ICD pocket hematoma with draining concerning for device infection Blood cultures no growth x 24 hours, pending today. Plan for TEE with anesthesia tomorrow [facility] has been contacted to arrange for extraction in OR on Friday. Will confirm. Hematology recommends input from [facility], and possibly postponing until then if non-urgent. If plan to proceed, recommended to use FFP and cryoprecipitate during procedure per DNP. MD Cosign/recommendations on that note pending. Continue IV ABX.  For questions or updates, please  contact [place] Please consult [email or web address] for contact info under Cardiology/STEMI.  Signed, Shirley Friar, PA-C  11/11/2019, 7:31 AM   EP Attending  Patient seen and examined. I have reached out to his wife as well to give her a plan for his care. Louis Ford continues to ooze from his surgical site, now many after pocket revision. We plan TEE tomorrow and device pocket exploration with likely system extraction. His surgery is not emergent but is urgent and transfer to [facility] is not appropriate for diagnostic reasons unless the hematology service feels that his bleeding risk despite FFP and cryoprecipitate is very high and they think that the hematologist at [facility] will be able to provide treatment that will substantially lower his risk of bleeding. If this is the case then we will cancel surgery.    ,M.D.  Mikle Bosworth.D.

## 2019-11-11 NOTE — Telephone Encounter (Signed)
Dr.Kale referred patient to [facility] for management of Complex Coagulopathy to see either Dr. Gita Kudo, [name], or Dr. Hulan Amato.  Office contacted initially 09/25/2019, Ph# [phone], spoke with Chantel and she requested the following be faxed to Fax # [phone]: -Demographics -Insurance information -Dr. Grier Mitts OV notes -Labs (req as far back as 3 yrs, Dr.Kale has only seen in clinic one time) Records request and fax number was sent to [place] on 09/25/2019.  [facility] office on 11/11/2019 @ [phone], spoke with Meka to f/u on referral. The office does not have the patient in their system at this time. Gave Meka date of previous call, patient name and dob patient. She requested records be resent to Fax # [phone].  Records request and [facility] fax number [phone] sent to [facility] 11/10/2019.

## 2019-11-12 ENCOUNTER — Encounter (HOSPITAL_COMMUNITY): Payer: Self-pay

## 2019-11-12 ENCOUNTER — Inpatient Hospital Stay (HOSPITAL_COMMUNITY): Payer: Medicare Other

## 2019-11-12 ENCOUNTER — Encounter (HOSPITAL_COMMUNITY)
Admission: AD | Disposition: A | Discharge status: Home Health | Payer: Self-pay | Source: Ambulatory Visit | Attending: Internal Medicine

## 2019-11-12 HISTORY — PX: TEE WITHOUT CARDIOVERSION: SHX5443

## 2019-11-12 LAB — BASIC METABOLIC PANEL
Anion gap: 7 (ref 5–15)
BUN: 14 mg/dL (ref 8–23)
CO2: 24 mmol/L (ref 22–32)
Calcium: 8.3 mg/dL — ABNORMAL LOW (ref 8.9–10.3)
Chloride: 100 mmol/L (ref 98–111)
Creatinine, Ser: 0.86 mg/dL (ref 0.61–1.24)
GFR calc Af Amer: >60 mL/min (ref >60)
GFR calc non Af Amer: >60 mL/min (ref >60)
Glucose, Bld: 83 mg/dL (ref 70–99)
Potassium: 4.3 mmol/L (ref 3.5–5.1)
Sodium: 131 mmol/L — ABNORMAL LOW (ref 135–145)

## 2019-11-12 LAB — PT FACTOR INHIBITOR (MIXING STUDY)
1 HR INCUB PT 1:1NP: 11.6 s (ref 9.1–12.0)
PT 1:1NP: 11.4 s (ref 9.1–12.0)
PT: 13 s — ABNORMAL HIGH (ref 9.1–12.0)

## 2019-11-12 LAB — PREPARE RBC (CROSSMATCH)

## 2019-11-12 LAB — MAGNESIUM: Magnesium: 1.7 mg/dL (ref 1.7–2.4)

## 2019-11-12 LAB — PTT FACTOR INHIBITOR (MIXING STUDY)
aPTT 1:1 NP Incub. Mix Ctl: 35.9 s — ABNORMAL HIGH (ref 22.9–30.2)
aPTT 1:1 NP Mix, 60 Min,Incub.: 36.6 s — ABNORMAL HIGH (ref 22.9–30.2)
aPTT 1:1 Normal Plasma: 33.5 s — ABNORMAL HIGH (ref 22.9–30.2)
aPTT: 43.4 s — ABNORMAL HIGH (ref 22.9–30.2)

## 2019-11-12 SURGERY — ECHOCARDIOGRAM, TRANSESOPHAGEAL
Anesthesia: Moderate Sedation

## 2019-11-12 MED ORDER — SODIUM CHLORIDE 0.9 % IV SOLN
INTRAVENOUS | Status: DC
  Administered 2019-11-13: 06:00:00 via INTRAVENOUS

## 2019-11-12 MED ORDER — MIDAZOLAM HCL (PF) 5 MG/ML IJ SOLN
INTRAMUSCULAR | Status: AC
  Filled 2019-11-12: qty 2

## 2019-11-12 MED ORDER — FENTANYL CITRATE (PF) 100 MCG/2ML IJ SOLN
INTRAMUSCULAR | Status: AC
  Filled 2019-11-12: qty 2

## 2019-11-12 MED ORDER — SODIUM CHLORIDE 0.9% IV SOLUTION: Freq: Once | INTRAVENOUS | Status: DC

## 2019-11-12 MED ORDER — LIDOCAINE VISCOUS HCL 2 % MT SOLN
OROMUCOSAL | Status: AC
  Filled 2019-11-12: qty 15

## 2019-11-12 MED ORDER — SODIUM CHLORIDE 0.9 % IV SOLN
80.0000 mg | INTRAVENOUS | Status: DC
  Filled 2019-11-12: qty 2

## 2019-11-12 MED ORDER — LIDOCAINE VISCOUS HCL 2 % MT SOLN
OROMUCOSAL | Status: DC | PRN
  Administered 2019-11-12: 20 mL via OROMUCOSAL

## 2019-11-12 MED ORDER — MIDAZOLAM HCL (PF) 10 MG/2ML IJ SOLN
INTRAMUSCULAR | Status: DC | PRN
  Administered 2019-11-12: 1 mg via INTRAVENOUS
  Administered 2019-11-12: 2 mg via INTRAVENOUS
  Administered 2019-11-12: 1 mg via INTRAVENOUS

## 2019-11-12 MED ORDER — CEFAZOLIN SODIUM-DEXTROSE 2-4 GM/100ML-% IV SOLN
2.0000 g | INTRAVENOUS | Status: DC
  Filled 2019-11-12: qty 100

## 2019-11-12 MED ORDER — FENTANYL CITRATE (PF) 100 MCG/2ML IJ SOLN
INTRAMUSCULAR | Status: DC | PRN
  Administered 2019-11-12: 50 ug via INTRAVENOUS

## 2019-11-12 MED ORDER — FUROSEMIDE 40 MG PO TABS: 40.0000 mg | ORAL_TABLET | Freq: Once | ORAL | Status: DC

## 2019-11-12 NOTE — Interval H&P Note (Signed)
History and Physical Interval Note:  11/12/2019 1:05 PM  [name].  has presented today for surgery, with the diagnosis of infected device.  The various methods of treatment have been discussed with the patient and family. After consideration of risks, benefits and other options for treatment, the patient has consented to  Procedure(s): TRANSESOPHAGEAL ECHOCARDIOGRAM (TEE) (N/A) as a surgical intervention.  The patient's history has been reviewed, patient examined, no change in status, stable for surgery.  I have reviewed the patient's chart and labs.  Questions were answered to the patient's satisfaction.     Louis Ford

## 2019-11-12 NOTE — Progress Notes (Signed)
During shift assessment minimal amount sangineous drainage on gauze. Left flank tender, bruising still marked.   Pt left floor for endoscopy at approx 1215.   Received report from Endo at 1347; no vegetation and EF of  40-45%. patient returned to floor at 1400. RN at bedside to assess patient upon return; A&Ox4 & calm.   Paged cardio master at 1409. Informed patient has returned from endo. ok to eat? please add diet orders.   Paged Jonni Sanger EP at [phone] at [phone]. Inquired about diet orders. Upon callback; PA will enter heart healthy diet and NPO at midnight.   Verified patient CSN given room change with tele at 1426  Call from blood bank at 1500 inquiring what coagulopathy; need info to prepare blood/plasma/cryo order. Provided blood bank with Jonni Sanger PA contact at [phone] to follow-up about coagulopathy inquiry. Confirmed 2 PRBC for on call to OR 11/20, 1 cryo at 0900 on 11/20 and 2 FFP for 1000 11/20.

## 2019-11-12 NOTE — Progress Notes (Addendum)
Electrophysiology Rounding Note  Patient Name: Louis Ford. Date of Encounter: 11/12/2019  Primary Cardiologist: Dr. Haroldine Laws  Electrophysiologist: Dr. Lovena Le   Subjective   The patient is feeling OK this am with no new complaints.   TEE planned for this afternoon.  Inpatient Medications    Scheduled Meds: . amiodarone  200 mg Oral Daily  . carvedilol  3.125 mg Oral BID WC  . divalproex  500 mg Oral QHS  . DULoxetine  30 mg Oral Daily  . feeding supplement (ENSURE ENLIVE)  237 mL Oral TID BM  . ferrous sulfate  325 mg Oral BID WC  . influenza vaccine adjuvanted  0.5 mL Intramuscular Tomorrow-1000  . levETIRAcetam  1,000 mg Oral BID  . phenytoin  100 mg Oral BID  . phytonadione  5 mg Oral Daily  . simvastatin  20 mg Oral q1800  . sodium chloride flush  3 mL Intravenous Q12H  . tamsulosin  0.4 mg Oral Daily   Continuous Infusions: . sodium chloride    . sodium chloride 10 mL/hr at 11/10/19 1458  . sodium chloride 20 mL/hr at 11/12/19 0001  .  ceFAZolin (ANCEF) IV 2 g (11/12/19 0650)   PRN Meds: sodium chloride, sodium chloride, acetaminophen, ondansetron (ZOFRAN) IV, polyethylene glycol, sodium chloride flush, traMADol   Vital Signs    Vitals:   11/11/19 1106 11/11/19 1744 11/11/19 2000 11/12/19 0508  BP: 126/87  112/73 111/84  Pulse: 82 88 81 76  Resp: 16  18 18   Temp: 98 F (36.7 C)  98.5 F (36.9 C) 98.1 F (36.7 C)  TempSrc: Oral  Oral Oral  SpO2: 99%  100% 100%  Weight:    83 kg  Height:        Intake/Output Summary (Last 24 hours) at 11/12/2019 0719 Last data filed at 11/12/2019 0650 Gross per 24 hour  Intake 1711 ml  Output 1450 ml  Net 261 ml   Filed Weights   11/10/19 0402 11/11/19 0556 11/12/19 0508  Weight: 84.9 kg 85 kg 83 kg    Physical Exam    GEN- The patient is well appearing, alert and oriented x 3 today.   Head- normocephalic, atraumatic Eyes-  Sclera clear, conjunctiva pink Ears- hearing intact Oropharynx- clear  Neck- supple Lungs- Clear to ausculation bilaterally, normal work of breathing Heart- Regular rate and rhythm, no murmurs, rubs or gallops GI- soft, NT, ND, + BS Extremities- no clubbing, cyanosis, or edema Skin- no rash or lesion Psych- euthymic mood, full affect Neuro- strength and sensation are intact  Labs    CBC Recent Labs    11/09/19 1440 11/10/19 0343  WBC 6.2 5.8  NEUTROABS 4.0  --   HGB 10.1* 9.7*  HCT 32.7* 30.8*  MCV 91.3 90.1  PLT 286 [id number]   Basic Metabolic Panel Recent Labs    11/09/19 1440  11/11/19 0451 11/12/19 0447  NA 136   < > 134* 131*  K 3.8   < > 3.8 4.3  CL 102   < > 104 100  CO2 24   < > 25 24  GLUCOSE 89   < > 79 83  BUN 19   < > 15 14  CREATININE 0.94   < > 0.93 0.86  CALCIUM 8.5*   < > 8.3* 8.3*  MG 1.5*  --   --   --    < > = values in this interval not displayed.   Liver Function Tests Recent Labs  11/09/19 1440  AST 15  ALT 10  ALKPHOS 100  BILITOT 1.2  PROT 9.1*  ALBUMIN 2.6*   No results for input(s): LIPASE, AMYLASE in the last 72 hours. Cardiac Enzymes No results for input(s): CKTOTAL, CKMB, CKMBINDEX, TROPONINI in the last 72 hours.   Telemetry    NSR 80-90s with occasional PVCs (personally reviewed)  Radiology    No results found.  Patient Profile     [facility]is a 73 y.o.malewith history of chronic systolic CHF, ICM, CAD, h/o spontaneous retroperitoneal bleed, ICD with history of appropriate therapies for VT, and h/o ICD complications including extraction of a left sided device due to "bleeding" per patient. Admitted for pocket hematoma with drainage and likely infection.  Assessment & Plan    1. ICD pocket hematoma with on-going draining concerning for infection BCx NGTD (Had started Keflex prior to draw) TEE planned for this afternoon.  OR arranged for 11/13/2019 Discussed personally with Dr. Irene Limbo with heme/onc.   Recommendation is 5-61ml/kg of FFP (which is 1-2 units for Mr Keator's weight)  and 5 units of cryo PRIOR to surgery.  Due to half-lives of the clotting factors, the closer we can do it to the surgery, the better, but certainly within the 12 hours leading up to it.  They also recommend optimization of local hemostasis using local interventions such as thrombin gel and pressure dressing.    For questions or updates, please contact [place] Please consult [email or web address] for contact info under Cardiology/STEMI.  Signed, Shirley Friar, PA-C  11/12/2019, 7:19 AM   EP Attending  Patient seen and examined. Agree with above. Appreciate the hematology service's input and recommendations. He will undergo TEE later today and plan system inspection and likely extraction tomorrow.   Mikle Bosworth.D.

## 2019-11-12 NOTE — CV Procedure (Signed)
    TRANSESOPHAGEAL ECHOCARDIOGRAM   NAME:  [facility].   MRN: [id number] DOB:  [birth year]   ADMIT DATE: 11/09/2019  INDICATIONS:   PROCEDURE:   Informed consent was obtained prior to the procedure. The risks, benefits and alternatives for the procedure were discussed and the patient comprehended these risks.  Risks include, but are not limited to, cough, sore throat, vomiting, nausea, somnolence, esophageal and stomach trauma or perforation, bleeding, low blood pressure, aspiration, pneumonia, infection, trauma to the teeth and death.    After a procedural time-out, the patient was given 5 mg versed and 50 mcg fentanyl for moderate sedation.  The oropharynx was anesthetized 10 cc of topical 1% viscous lidocaine.  The transesophageal probe was inserted in the esophagus and stomach without difficulty and multiple views were obtained.    COMPLICATIONS:    There were no immediate complications.  FINDINGS:  LEFT VENTRICLE: EF = 40-45%. No regional wall motion abnormalities.  RIGHT VENTRICLE: Normal size and function. Single chamber ICD wire. There is a nodular density at the insertion of the ICD wire and RV. This appears to be part of the RV trabeculae and NOT a vegetation   LEFT ATRIUM: Markedly dilated  LEFT ATRIAL APPENDAGE: No thrombus.   RIGHT ATRIUM: Markedly dilated  AORTIC VALVE:  Trileaflet. Trivial AI. No vegetation.   MITRAL VALVE:    Normal. Mild MR. No vegetation.   TRICUSPID VALVE: Normal. Moderate TR. No vegetation.   PULMONIC VALVE: Grossly normal. No vegetation. Trivial PR  INTERATRIAL SEPTUM: No PFO or ASD.  PERICARDIUM: No effusion  DESCENDING AORTA: Moderate plaque   CONCLUSION:  No convincing evidence of endocarditis. As noted above, there is a nodular density at the insertion of the ICD wire and RV. This appears to be part of the RV trabeculae and NOT a vegetation   Krystie Leiter,MD 1:36 PM

## 2019-11-13 ENCOUNTER — Inpatient Hospital Stay (HOSPITAL_COMMUNITY): Payer: Medicare Other

## 2019-11-13 ENCOUNTER — Encounter (HOSPITAL_COMMUNITY)
Admission: AD | Disposition: A | Discharge status: Home Health | Payer: Self-pay | Source: Ambulatory Visit | Attending: Internal Medicine

## 2019-11-13 ENCOUNTER — Other Ambulatory Visit (HOSPITAL_COMMUNITY): Payer: Medicare Other

## 2019-11-13 ENCOUNTER — Inpatient Hospital Stay (HOSPITAL_COMMUNITY): Payer: Medicare Other | Admitting: Certified Registered Nurse Anesthetist

## 2019-11-13 ENCOUNTER — Encounter (HOSPITAL_COMMUNITY): Payer: Self-pay | Admitting: Internal Medicine

## 2019-11-13 DIAGNOSIS — T827XXA Infection and inflammatory reaction due to other cardiac and vascular devices, implants and grafts, initial encounter: Secondary | ICD-10-CM

## 2019-11-13 DIAGNOSIS — T827XXD Infection and inflammatory reaction due to other cardiac and vascular devices, implants and grafts, subsequent encounter: Secondary | ICD-10-CM

## 2019-11-13 HISTORY — PX: TEE WITHOUT CARDIOVERSION: SHX5443

## 2019-11-13 HISTORY — PX: ICD LEAD REMOVAL: SHX5855

## 2019-11-13 LAB — BASIC METABOLIC PANEL
Anion gap: 8 (ref 5–15)
BUN: 16 mg/dL (ref 8–23)
CO2: 22 mmol/L (ref 22–32)
Calcium: 8.5 mg/dL — ABNORMAL LOW (ref 8.9–10.3)
Chloride: 102 mmol/L (ref 98–111)
Creatinine, Ser: 0.9 mg/dL (ref 0.61–1.24)
GFR calc Af Amer: >60 mL/min (ref >60)
GFR calc non Af Amer: >60 mL/min (ref >60)
Glucose, Bld: 71 mg/dL (ref 70–99)
Potassium: 4.4 mmol/L (ref 3.5–5.1)
Sodium: 132 mmol/L — ABNORMAL LOW (ref 135–145)

## 2019-11-13 LAB — SURGICAL PCR SCREEN
MRSA, PCR: NEGATIVE
Staphylococcus aureus: NEGATIVE

## 2019-11-13 LAB — MAGNESIUM: Magnesium: 1.8 mg/dL (ref 1.7–2.4)

## 2019-11-13 SURGERY — REMOVAL, ELECTRODE LEAD, ICD
Anesthesia: General | Site: Chest | Laterality: Right

## 2019-11-13 MED ORDER — MIDAZOLAM HCL 2 MG/2ML IJ SOLN
INTRAMUSCULAR | Status: AC
  Filled 2019-11-13: qty 2

## 2019-11-13 MED ORDER — FENTANYL CITRATE (PF) 250 MCG/5ML IJ SOLN
INTRAMUSCULAR | Status: DC | PRN
  Administered 2019-11-13: 75 ug via INTRAVENOUS
  Administered 2019-11-13 (×2): 25 ug via INTRAVENOUS
  Administered 2019-11-13: 75 ug via INTRAVENOUS

## 2019-11-13 MED ORDER — LIDOCAINE HCL (PF) 1 % IJ SOLN
INTRAMUSCULAR | Status: AC
  Filled 2019-11-13: qty 30

## 2019-11-13 MED ORDER — OXYCODONE HCL 5 MG/5ML PO SOLN: 5.0000 mg | Freq: Once | ORAL | Status: DC | PRN

## 2019-11-13 MED ORDER — PROPOFOL 10 MG/ML IV BOLUS
INTRAVENOUS | Status: AC
  Filled 2019-11-13: qty 20

## 2019-11-13 MED ORDER — SODIUM CHLORIDE 0.9 % IV SOLN
INTRAVENOUS | Status: DC | PRN
  Administered 2019-11-13: 500 mL

## 2019-11-13 MED ORDER — OXYCODONE HCL 5 MG PO TABS: 5.0000 mg | ORAL_TABLET | Freq: Once | ORAL | Status: DC | PRN

## 2019-11-13 MED ORDER — PROPOFOL 10 MG/ML IV BOLUS
INTRAVENOUS | Status: DC | PRN
  Administered 2019-11-13: 120 mg via INTRAVENOUS

## 2019-11-13 MED ORDER — HEMOSTATIC AGENTS (NO CHARGE) OPTIME
TOPICAL | Status: DC | PRN
  Administered 2019-11-13: 1 via TOPICAL

## 2019-11-13 MED ORDER — SODIUM CHLORIDE (PF) 0.9 % IJ SOLN
INTRAMUSCULAR | Status: AC
  Filled 2019-11-13: qty 10

## 2019-11-13 MED ORDER — DEXAMETHASONE SODIUM PHOSPHATE 10 MG/ML IJ SOLN
INTRAMUSCULAR | Status: AC
  Filled 2019-11-13: qty 2

## 2019-11-13 MED ORDER — FENTANYL CITRATE (PF) 250 MCG/5ML IJ SOLN
INTRAMUSCULAR | Status: AC
  Filled 2019-11-13: qty 5

## 2019-11-13 MED ORDER — LACTATED RINGERS IV SOLN
INTRAVENOUS | Status: DC | PRN
  Administered 2019-11-13: 16:00:00 via INTRAVENOUS

## 2019-11-13 MED ORDER — CEFAZOLIN SODIUM-DEXTROSE 1-4 GM/50ML-% IV SOLN: 1.0000 g | Freq: Four times a day (QID) | INTRAVENOUS | Status: DC

## 2019-11-13 MED ORDER — PHENYLEPHRINE 40 MCG/ML (10ML) SYRINGE FOR IV PUSH (FOR BLOOD PRESSURE SUPPORT)
PREFILLED_SYRINGE | INTRAVENOUS | Status: AC
  Filled 2019-11-13: qty 20

## 2019-11-13 MED ORDER — CHLORHEXIDINE GLUCONATE CLOTH 2 % EX PADS
6.0000 | MEDICATED_PAD | Freq: Every day | CUTANEOUS | Status: DC
  Administered 2019-11-14 – 2019-11-20 (×6): 6 via TOPICAL

## 2019-11-13 MED ORDER — ONDANSETRON HCL 4 MG/2ML IJ SOLN
INTRAMUSCULAR | Status: AC
  Filled 2019-11-13: qty 2

## 2019-11-13 MED ORDER — MIDAZOLAM HCL 2 MG/2ML IJ SOLN
INTRAMUSCULAR | Status: DC | PRN
  Administered 2019-11-13 (×2): 1 mg via INTRAVENOUS

## 2019-11-13 MED ORDER — SODIUM CHLORIDE 0.9 % IV SOLN
INTRAVENOUS | Status: AC
  Filled 2019-11-13: qty 1.2

## 2019-11-13 MED ORDER — ONDANSETRON HCL 4 MG/2ML IJ SOLN: 4.0000 mg | Freq: Four times a day (QID) | INTRAMUSCULAR | Status: DC | PRN

## 2019-11-13 MED ORDER — ESMOLOL HCL 100 MG/10ML IV SOLN
INTRAVENOUS | Status: DC | PRN
  Administered 2019-11-13: 20 mg via INTRAVENOUS

## 2019-11-13 MED ORDER — SODIUM CHLORIDE 0.9 % IV SOLN
INTRAVENOUS | Status: AC
  Filled 2019-11-13: qty 2

## 2019-11-13 MED ORDER — DEXAMETHASONE SODIUM PHOSPHATE 10 MG/ML IJ SOLN
INTRAMUSCULAR | Status: DC | PRN
  Administered 2019-11-13: 10 mg via INTRAVENOUS

## 2019-11-13 MED ORDER — ROCURONIUM BROMIDE 10 MG/ML (PF) SYRINGE
PREFILLED_SYRINGE | INTRAVENOUS | Status: AC
  Filled 2019-11-13: qty 40

## 2019-11-13 MED ORDER — ACETAMINOPHEN 325 MG PO TABS
325.0000 mg | ORAL_TABLET | ORAL | Status: DC | PRN
  Administered 2019-11-16: 650 mg via ORAL
  Administered 2019-11-17: 325 mg via ORAL
  Administered 2019-11-18: 650 mg via ORAL
  Filled 2019-11-13: qty 2
  Filled 2019-11-13: qty 1
  Filled 2019-11-13: qty 2

## 2019-11-13 MED ORDER — GLYCOPYRROLATE PF 0.2 MG/ML IJ SOSY
PREFILLED_SYRINGE | INTRAMUSCULAR | Status: AC
  Filled 2019-11-13: qty 1

## 2019-11-13 MED ORDER — FENTANYL CITRATE (PF) 100 MCG/2ML IJ SOLN: 25.0000 ug | INTRAMUSCULAR | Status: DC | PRN

## 2019-11-13 MED ORDER — SUGAMMADEX SODIUM 200 MG/2ML IV SOLN
INTRAVENOUS | Status: DC | PRN
  Administered 2019-11-13: 200 mg via INTRAVENOUS

## 2019-11-13 MED ORDER — ONDANSETRON HCL 4 MG/2ML IJ SOLN: 4.0000 mg | Freq: Once | INTRAMUSCULAR | Status: DC | PRN

## 2019-11-13 MED ORDER — LIDOCAINE 2% (20 MG/ML) 5 ML SYRINGE
INTRAMUSCULAR | Status: AC
  Filled 2019-11-13: qty 15

## 2019-11-13 MED ORDER — SODIUM CHLORIDE 0.9 % IV SOLN: INTRAVENOUS | Status: DC | PRN

## 2019-11-13 MED ORDER — ROCURONIUM BROMIDE 10 MG/ML (PF) SYRINGE
PREFILLED_SYRINGE | INTRAVENOUS | Status: DC | PRN
  Administered 2019-11-13 (×2): 30 mg via INTRAVENOUS
  Administered 2019-11-13: 50 mg via INTRAVENOUS

## 2019-11-13 MED ORDER — LIDOCAINE 2% (20 MG/ML) 5 ML SYRINGE
INTRAMUSCULAR | Status: DC | PRN
  Administered 2019-11-13: 80 mg via INTRAVENOUS

## 2019-11-13 MED ORDER — EPHEDRINE 5 MG/ML INJ
INTRAVENOUS | Status: AC
  Filled 2019-11-13: qty 20

## 2019-11-13 SURGICAL SUPPLY — 67 items
BAG BANDED W/RUBBER/TAPE 36X54 (MISCELLANEOUS) IMPLANT
BAG DECANTER FOR FLEXI CONT (MISCELLANEOUS) ×2 IMPLANT
BLADE CLIPPER SURG (BLADE) IMPLANT
BLADE OSCILLATING /SAGITTAL (BLADE) IMPLANT
BLADE STERNUM SYSTEM 6 (BLADE) IMPLANT
BNDG COHESIVE 4X5 WHT NS (GAUZE/BANDAGES/DRESSINGS) ×4 IMPLANT
CANISTER SUCT 3000ML PPV (MISCELLANEOUS) ×4 IMPLANT
COVER BACK TABLE 60X90IN (DRAPES) ×4 IMPLANT
COVER DOME SNAP 22 D (MISCELLANEOUS) ×2 IMPLANT
COVER SURGICAL LIGHT HANDLE (MISCELLANEOUS) ×2 IMPLANT
COVER WAND RF STERILE (DRAPES) ×4 IMPLANT
DEVICE LCKNG LEAD CARDIAC (CATHETERS) IMPLANT
DRAIN CHANNEL 10F 3/8 F FF (DRAIN) ×2 IMPLANT
DRAPE C-ARM 42X72 X-RAY (DRAPES) IMPLANT
DRAPE CARDIOVASCULAR INCISE (DRAPES) ×2
DRAPE HALF SHEET 40X57 (DRAPES) ×8 IMPLANT
DRAPE INCISE IOBAN 66X45 STRL (DRAPES) IMPLANT
DRAPE SRG 135X102X78XABS (DRAPES) ×2 IMPLANT
DRYSEAL FLEXSHEATH 14FR 33CM (SHEATH) ×2
ELECT REM PT RETURN 9FT ADLT (ELECTROSURGICAL) ×8
ELECTRODE REM PT RTRN 9FT ADLT (ELECTROSURGICAL) ×4 IMPLANT
EVACUATOR SILICONE 100CC (DRAIN) ×2 IMPLANT
FELT TEFLON 1X6 (MISCELLANEOUS) IMPLANT
GAUZE 4X4 16PLY RFD (DISPOSABLE) ×6 IMPLANT
GAUZE SPONGE 4X4 12PLY STRL (GAUZE/BANDAGES/DRESSINGS) ×4 IMPLANT
GAUZE SPONGE 4X4 12PLY STRL LF (GAUZE/BANDAGES/DRESSINGS) ×2 IMPLANT
GLOVE BIO SURGEON STRL SZ 6.5 (GLOVE) ×1 IMPLANT
GLOVE BIO SURGEONS STRL SZ 6.5 (GLOVE) ×1
GLOVE BIOGEL PI IND STRL 7.5 (GLOVE) ×2 IMPLANT
GLOVE BIOGEL PI INDICATOR 7.5 (GLOVE) ×4
GLOVE ECLIPSE 6.5 STRL STRAW (GLOVE) ×2 IMPLANT
GLOVE ECLIPSE 8.0 STRL XLNG CF (GLOVE) ×4 IMPLANT
GLOVE SURG SS PI 7.0 STRL IVOR (GLOVE) ×2 IMPLANT
GOWN STRL REUS W/ TWL LRG LVL3 (GOWN DISPOSABLE) ×2 IMPLANT
GOWN STRL REUS W/ TWL XL LVL3 (GOWN DISPOSABLE) ×2 IMPLANT
GOWN STRL REUS W/TWL LRG LVL3 (GOWN DISPOSABLE) ×6
GOWN STRL REUS W/TWL XL LVL3 (GOWN DISPOSABLE)
KIT TURNOVER KIT B (KITS) ×4 IMPLANT
NDL PERC 18GX7CM (NEEDLE) IMPLANT
NEEDLE PERC 18GX7CM (NEEDLE) ×4 IMPLANT
NS IRRIG 1000ML POUR BTL (IV SOLUTION) IMPLANT
PAD ARMBOARD 7.5X6 YLW CONV (MISCELLANEOUS) ×8 IMPLANT
PAD ELECT DEFIB RADIOL ZOLL (MISCELLANEOUS) ×4 IMPLANT
POWDER SURGICEL 3.0 GRAM (HEMOSTASIS) ×2 IMPLANT
REMOVAL LLD CARDIAC LEAD EZ (CATHETERS) ×4
SHEATH 11 SUB-C ROTATE DILATOR (SHEATH) ×2 IMPLANT
SHEATH DRYSEAL FLEX 14FR 33CM (SHEATH) IMPLANT
SHEATH FAST CATH 10F 12CM (SHEATH) ×2 IMPLANT
SHEATH PINNACLE 6F 10CM (SHEATH) ×2 IMPLANT
SLING ARM FOAM STRAP LRG (SOFTGOODS) ×2 IMPLANT
SNARE GOOSENECK 10MM (VASCULAR PRODUCTS) ×2 IMPLANT
SNARE NDL EYE 20MM RETRIEVL (CATHETERS) IMPLANT
SNARE NEEDLE EYE 20MM RETRIEVL (CATHETERS) ×4 IMPLANT
SUT ETHILON 2 0 FS 18 (SUTURE) ×2 IMPLANT
SUT ETHILON 3 0 PS 1 (SUTURE) ×2 IMPLANT
SUT PROLENE 2 0 CT2 30 (SUTURE) IMPLANT
SUT PROLENE 2 0 SH DA (SUTURE) IMPLANT
SUT VIC AB 2-0 CT2 18 VCP726D (SUTURE) IMPLANT
SUT VIC AB 3-0 X1 27 (SUTURE) IMPLANT
TAPE CLOTH SURG 4X10 WHT LF (GAUZE/BANDAGES/DRESSINGS) ×4 IMPLANT
TOWEL GREEN STERILE (TOWEL DISPOSABLE) ×8 IMPLANT
TOWEL GREEN STERILE FF (TOWEL DISPOSABLE) ×8 IMPLANT
TRAY FOLEY MTR SLVR 16FR STAT (SET/KITS/TRAYS/PACK) ×4 IMPLANT
TUBE CONNECTING 12'X1/4 (SUCTIONS) ×1
TUBE CONNECTING 12X1/4 (SUCTIONS) ×1 IMPLANT
TUBING EXTENTION W/L.L. (IV SETS) ×2 IMPLANT
YANKAUER SUCT BULB TIP NO VENT (SUCTIONS) ×2 IMPLANT

## 2019-11-13 NOTE — Transfer of Care (Addendum)
Immediate Anesthesia Transfer of Care Note  Patient: [facility].  Procedure(s) Performed: ICD EXTRACTION and Lead Extraction (Right Chest) TRANSESOPHAGEAL ECHOCARDIOGRAM (TEE) (N/A )  Patient Location: PACU  Anesthesia Type:General  Level of Consciousness: drowsy, patient cooperative and responds to stimulation  Airway & Oxygen Therapy: Patient Spontanous Breathing and Patient connected to face mask oxygen  Post-op Assessment: Report given to RN and Post -op Vital signs reviewed and stable  Post vital signs: Reviewed and stable  Last Vitals:  Vitals Value Taken Time  BP 120/79 11/13/19 2010  Temp 36.1 C 11/13/19 2010  Pulse 68 11/13/19 2013  Resp 16 11/13/19 2013  SpO2 100 % 11/13/19 2013  Vitals shown include unvalidated device data.  Last Pain:  Vitals:   11/13/19 1343  TempSrc: Oral  PainSc:       Patients Stated Pain Goal: 0 ([id number])  Complications: No apparent anesthesia complications

## 2019-11-13 NOTE — Anesthesia Procedure Notes (Signed)
Procedure Name: Intubation Date/Time: 11/13/2019 5:45 PM Performed by: Cleda Daub, CRNA Pre-anesthesia Checklist: Patient identified, Emergency Drugs available, Suction available and Patient being monitored Patient Re-evaluated:Patient Re-evaluated prior to induction Oxygen Delivery Method: Circle system utilized Preoxygenation: Pre-oxygenation with 100% oxygen Induction Type: IV induction Ventilation: Mask ventilation without difficulty, Oral airway inserted - appropriate to patient size and Mask ventilation throughout procedure Laryngoscope Size: Miller and 2 Grade View: Grade I Tube type: Oral Tube size: 8.0 mm Number of attempts: 1 Airway Equipment and Method: Stylet and Oral airway Placement Confirmation: ETT inserted through vocal cords under direct vision,  positive ETCO2 and breath sounds checked- equal and bilateral Secured at: 24 cm Tube secured with: Tape Dental Injury: Teeth and Oropharynx as per pre-operative assessment

## 2019-11-13 NOTE — Care Management Important Message (Signed)
Important Message  Patient Details  Name: [facility]. MRN: [id number] Date of Birth: 1946-05-26   Medicare Important Message Given:  Yes     Louis Ford 11/13/2019, 1:39 PM

## 2019-11-13 NOTE — Anesthesia Postprocedure Evaluation (Signed)
Anesthesia Post Note  Patient: [facility].  Procedure(s) Performed: ICD EXTRACTION and Lead Extraction (Right Chest) TRANSESOPHAGEAL ECHOCARDIOGRAM (TEE) (N/A )     Patient location during evaluation: PACU Anesthesia Type: General Level of consciousness: awake Pain management: pain level controlled Vital Signs Assessment: post-procedure vital signs reviewed and stable Respiratory status: spontaneous breathing, nonlabored ventilation, respiratory function stable and patient connected to nasal cannula oxygen Cardiovascular status: blood pressure returned to baseline and stable Postop Assessment: no apparent nausea or vomiting Anesthetic complications: no    Last Vitals:  Vitals:   11/13/19 2115 11/13/19 2132  BP: 108/77 126/80  Pulse: 74 75  Resp: (!) 7 16  Temp: 36.6 C   SpO2: 99% 90%    Last Pain:  Vitals:   11/13/19 2100  TempSrc:   PainSc: 0-No pain                 Louis Ford P Louis Ford

## 2019-11-13 NOTE — Progress Notes (Signed)
Patient back from procedure, report received from RN. Surgical dressings assessed. VSS. Will continue to monitor throughout the shift.

## 2019-11-13 NOTE — Progress Notes (Addendum)
Electrophysiology Rounding Note  Patient Name: Louis Ford. Date of Encounter: 11/13/2019  Primary Cardiologist: Dr. Haroldine Laws  Electrophysiologist: Dr. Lovena Le   Subjective   The patient is feeling OK this am with no new concerns.  TEE 11/12/2019 LVEF 40-45% RIGHT VENTRICLE: Normal size and function. Single chamber ICD wire. There is a nodular density at the insertion of the ICD wire and RV. This appears to be part of the RV trabeculae and NOT a vegetation   Inpatient Medications    Scheduled Meds: . sodium chloride   Intravenous Once  . sodium chloride   Intravenous Once  . amiodarone  200 mg Oral Daily  . carvedilol  3.125 mg Oral BID WC  . divalproex  500 mg Oral QHS  . DULoxetine  30 mg Oral Daily  . feeding supplement (ENSURE ENLIVE)  237 mL Oral TID BM  . ferrous sulfate  325 mg Oral BID WC  . gentamicin irrigation  80 mg Irrigation On Call  . influenza vaccine adjuvanted  0.5 mL Intramuscular Tomorrow-1000  . levETIRAcetam  1,000 mg Oral BID  . phenytoin  100 mg Oral BID  . phytonadione  5 mg Oral Daily  . simvastatin  20 mg Oral q1800  . sodium chloride flush  3 mL Intravenous Q12H  . tamsulosin  0.4 mg Oral Daily   Continuous Infusions: . sodium chloride    . sodium chloride 250 mL (11/12/19 2329)  . sodium chloride 50 mL/hr at 11/13/19 0559  .  ceFAZolin (ANCEF) IV 2 g (11/13/19 0601)  .  ceFAZolin (ANCEF) IV     PRN Meds: sodium chloride, sodium chloride, acetaminophen, ondansetron (ZOFRAN) IV, polyethylene glycol, sodium chloride flush, traMADol   Vital Signs    Vitals:   11/12/19 1941 11/13/19 0126 11/13/19 0352 11/13/19 0603  BP: 104/72  112/79 (!) 112/93  Pulse: 87  83 87  Resp: 18  18   Temp: 97.8 F (36.6 C)  98.5 F (36.9 C)   TempSrc: Oral  Oral   SpO2: 100%  100%   Weight:  85.1 kg    Height:        Intake/Output Summary (Last 24 hours) at 11/13/2019 0758 Last data filed at 11/13/2019 0604 Gross per 24 hour  Intake 1600 ml   Output 1425 ml  Net 175 ml   Filed Weights   11/11/19 0556 11/12/19 0508 11/13/19 0126  Weight: 85 kg 83 kg 85.1 kg    Physical Exam    GEN- The patient is well appearing, alert and oriented x 3 today.   Head- normocephalic, atraumatic Eyes-  Sclera clear, conjunctiva pink Ears- hearing intact Oropharynx- clear Neck- supple Lungs- Clear to ausculation bilaterally, normal work of breathing Heart- Regular rate and rhythm, no murmurs, rubs or gallops GI- soft, NT, ND, + BS Extremities- no clubbing, cyanosis, or edema Skin- no rash or lesion Psych- euthymic mood, full affect Neuro- strength and sensation are intact  Labs    CBC No results for input(s): WBC, NEUTROABS, HGB, HCT, MCV, PLT in the last 72 hours. Basic Metabolic Panel Recent Labs    11/12/19 0447 11/13/19 0315  NA 131* 132*  K 4.3 4.4  CL 100 102  CO2 24 22  GLUCOSE 83 71  BUN 14 16  CREATININE 0.86 0.90  CALCIUM 8.3* 8.5*  MG 1.7 1.8   Liver Function Tests No results for input(s): AST, ALT, ALKPHOS, BILITOT, PROT, ALBUMIN in the last 72 hours. No results for input(s): LIPASE, AMYLASE  in the last 72 hours. Cardiac Enzymes No results for input(s): CKTOTAL, CKMB, CKMBINDEX, TROPONINI in the last 72 hours.   Telemetry    NSR 80-90s with occasional PVCs (personally reviewed)  Radiology    No results found.  Patient Profile     Louis Fordis a 73 y.o.malewith history of chronic systolic CHF, ICM, CAD, h/o spontaneous retroperitoneal bleed, ICD with history of appropriate therapies for VT, and h/o ICD complications including extraction of a left sided device due to "bleeding" per patient.Admitted for pocket hematoma with drainage and likely infection.  Assessment & Plan    1. ICD pocket hematoma with on-going draining concerning for infection BCx NGTD (Had started Keflex prior to draw) TEE planned for this afternoon. OR arranged for this afternoon.  Plan for blood products prior to case  discussed with Dr. Lovena Le, and previously with Dr. Irene Limbo. Pt will get 1 pool (5 units) of cryo starting at NOON, and 2 units of FFP, over 2 hours a piece, starting at 1300. The second unit is expected to be running while patient comes down to OR, pending schedule.  They also recommend optimization of local hemostasis using local interventions such as thrombin gel and pressure dressing.    For questions or updates, please contact [place] Please consult [email or web address] for contact info under Cardiology/STEMI.  Signed, Shirley Friar, PA-C  11/13/2019, 7:58 AM   EP Attending  Patient seen and examined. Agree with the findings as noted above. The patient is stabke today and will be given FFP and crypoprecipitate as recommended by the hematology service. I have reviewed the indications/risks/benefits/goals/expectations of the procedure and he wishes to proceed. Mikle Bosworth.D.

## 2019-11-13 NOTE — Plan of Care (Signed)
  Problem: Education: Goal: Knowledge of General Education information will improve Description: Including pain rating scale, medication(s)/side effects and non-pharmacologic comfort measures 11/13/2019 0225 by Tristan Schroeder, RN Outcome: Progressing 11/13/2019 0225 by Tristan Schroeder, RN Outcome: Progressing   Problem: Health Behavior/Discharge Planning: Goal: Ability to manage health-related needs will improve 11/13/2019 0225 by Tristan Schroeder, RN Outcome: Progressing 11/13/2019 0225 by Tristan Schroeder, RN Outcome: Progressing

## 2019-11-13 NOTE — Anesthesia Preprocedure Evaluation (Signed)
Anesthesia Evaluation  Patient identified by MRN, date of birth, ID band Patient awake    Reviewed: Allergy & Precautions, NPO status , Patient's Chart, lab work & pertinent test results  Airway Mallampati: II  TM Distance: >3 FB     Dental  (+) Edentulous Upper, Edentulous Lower   Pulmonary former smoker,    breath sounds clear to auscultation       Cardiovascular hypertension,  Rhythm:Regular Rate:Normal     Neuro/Psych    GI/Hepatic   Endo/Other    Renal/GU      Musculoskeletal   Abdominal   Peds  Hematology   Anesthesia Other Findings   Reproductive/Obstetrics                             Anesthesia Physical Anesthesia Plan  ASA: III  Anesthesia Plan: General   Post-op Pain Management:    Induction: Intravenous  PONV Risk Score and Plan: Dexamethasone and Ondansetron  Airway Management Planned: Oral ETT  Additional Equipment: Arterial line  Intra-op Plan:   Post-operative Plan:   Informed Consent: I have reviewed the patients History and Physical, chart, labs and discussed the procedure including the risks, benefits and alternatives for the proposed anesthesia with the patient or authorized representative who has indicated his/her understanding and acceptance.       Plan Discussed with: CRNA and Anesthesiologist  Anesthesia Plan Comments:         Anesthesia Quick Evaluation

## 2019-11-13 NOTE — Interval H&P Note (Signed)
History and Physical Interval Note:  11/13/2019 5:44 PM  [facility].  has presented today for surgery, with the diagnosis of ICD POCKET INFECTION.  The various methods of treatment have been discussed with the patient and family. After consideration of risks, benefits and other options for treatment, the patient has consented to  Procedure(s) with comments: ICD EXTRACTION (Right) - Dr. Servando Snare for backup TRANSESOPHAGEAL ECHOCARDIOGRAM (TEE) (N/A) as a surgical intervention.  The patient's history has been reviewed, patient examined, no change in status, stable for surgery.  I have reviewed the patient's chart and labs.  Questions were answered to the patient's satisfaction.     [name]

## 2019-11-13 NOTE — Progress Notes (Signed)
Report given to short stay RN Sam. 1st bag of FFP still infusing. Cefazolin and 2nd FFP will be sent down with the pt. Will monitor.

## 2019-11-13 NOTE — Op Note (Signed)
EP procedure note  Procedure performed: Extraction of a single-chamber ICD  Preoperative diagnosis: ICD pocket infection  Postoperative diagnosis: Same as preoperative diagnosis  Description of the procedure: After informed consent was obtained, the patient was taken to the operating room in the fasting state.  Arterial hemodynamic monitoring was carried out by the anesthesia service as well as general endotracheal anesthesia.  A TEE probe was placed to monitor his pericardium for bleeding.  A 6 French sheath was inserted percutaneously in the right femoral vein and advanced to the iliac vein.  The attention was then turned to removal of the patient's ICD.  A 5 cm incision was carried out.  Electrocautery was utilized to dissect down to the pocket.  There was copious amounts of bloody fluid along with some clotted blood along with some pockets of purulent fluid.  The pocket was irrigated.  The generator was removed with gentle traction.  The lead was freed up from its dense fibrous scar tissue with electrocautery.  There was extensive bleeding.  Additional electrocautery was utilized to dissect down to the patient's ICD lead sewing sleeve and it was retracted.  The lead was disconnected from the generator.  A stylette was inserted into the body of the lead and the helix retracted.  The stylette was removed and the lead was cut.  A [employer] Z locking stylette was inserted into the body of the lead.  The [employer] 83 [place] sub-C short dissection sheath was then advanced over the body of the lead into the subclavian vein.  Gentle traction was placed on the body of the lead and it was retracted from the right ventricle up through the right atrium and into the superior vena cava.  At the right subclavian vein SVC junction the lead became stuck and could not be retracted further.  Attempts to dissect through the subclavian vein failed as the lead went through the first rib and clavicle and the  extraction sheath could not pass through these bony structures.  At this point attention was turned to snaring of the lead.  A long 035 wire was inserted into the right femoral vein and advanced into the right atrium.  A [employer] was advanced over the guidewire but could not pass through scar tissue at the end of the right femoral vein.  A dilator could be advanced but the 16 French sheath would not pass.  A 10 French sheath was inserted.  The 6 French gooseneck snare was then inserted into the 10 French sheath and advanced under fluoroscopic guidance towards the right atrium.  Unfortunately the 10 French sheath came out of the venous system and attempts to advance it back into the venous system were quite difficult.  Pressure was held.  The gooseneck snare was then used to snare the defibrillator lead successfully.  The lead was cut.  The lead was pulled down into the femoral vein but could not be brought into the sheath and was stuck.  We pushed the snare and the lead back into the inferior vena cava and the snare was advanced past the distal coil and a second attempt to extract the lead was carried out this time successfully without any evidence of damage to the femoral vein.  Pressure was held for 30 minutes and hemostasis was obtained.  Attention was then turned to hemostasis of the right infraclavicular pocket.  This was quite difficult secondary to the patient's coagulopathy and extensive scar tissue.  Eventually some anticoagulant powder  was inserted into the pocket and coagulation appeared to be present.  The incision was closed with multiple 2-0 Prolene suture.  A pressure dressing was applied.  A drain was placed.  The patient was returned to the recovery area in stable condition.  Complications: There were no immediate procedure complications  Conclusion: Successful but very difficult extraction of a 71-year-old ICD system in a patient with ICD pocket infection.  [name],  MD

## 2019-11-13 NOTE — H&P (View-Only) (Signed)
Electrophysiology Rounding Note  Patient Name: [facility]. Date of Encounter: 11/13/2019  Primary Cardiologist: Dr. Haroldine Laws  Electrophysiologist: Dr. Lovena Le   Subjective   The patient is feeling OK this am with no new concerns.  TEE 11/12/2019 LVEF 40-45% RIGHT VENTRICLE: Normal size and function. Single chamber ICD wire. There is a nodular density at the insertion of the ICD wire and RV. This appears to be part of the RV trabeculae and NOT a vegetation   Inpatient Medications    Scheduled Meds: . sodium chloride   Intravenous Once  . sodium chloride   Intravenous Once  . amiodarone  200 mg Oral Daily  . carvedilol  3.125 mg Oral BID WC  . divalproex  500 mg Oral QHS  . DULoxetine  30 mg Oral Daily  . feeding supplement (ENSURE ENLIVE)  237 mL Oral TID BM  . ferrous sulfate  325 mg Oral BID WC  . gentamicin irrigation  80 mg Irrigation On Call  . influenza vaccine adjuvanted  0.5 mL Intramuscular Tomorrow-1000  . levETIRAcetam  1,000 mg Oral BID  . phenytoin  100 mg Oral BID  . phytonadione  5 mg Oral Daily  . simvastatin  20 mg Oral q1800  . sodium chloride flush  3 mL Intravenous Q12H  . tamsulosin  0.4 mg Oral Daily   Continuous Infusions: . sodium chloride    . sodium chloride 250 mL (11/12/19 2329)  . sodium chloride 50 mL/hr at 11/13/19 0559  .  ceFAZolin (ANCEF) IV 2 g (11/13/19 0601)  .  ceFAZolin (ANCEF) IV     PRN Meds: sodium chloride, sodium chloride, acetaminophen, ondansetron (ZOFRAN) IV, polyethylene glycol, sodium chloride flush, traMADol   Vital Signs    Vitals:   11/12/19 1941 11/13/19 0126 11/13/19 0352 11/13/19 0603  BP: 104/72  112/79 (!) 112/93  Pulse: 87  83 87  Resp: 18  18   Temp: 97.8 F (36.6 C)  98.5 F (36.9 C)   TempSrc: Oral  Oral   SpO2: 100%  100%   Weight:  85.1 kg    Height:        Intake/Output Summary (Last 24 hours) at 11/13/2019 0758 Last data filed at 11/13/2019 0604 Gross per 24 hour  Intake 1600 ml   Output 1425 ml  Net 175 ml   Filed Weights   11/11/19 0556 11/12/19 0508 11/13/19 0126  Weight: 85 kg 83 kg 85.1 kg    Physical Exam    GEN- The patient is well appearing, alert and oriented x 3 today.   Head- normocephalic, atraumatic Eyes-  Sclera clear, conjunctiva pink Ears- hearing intact Oropharynx- clear Neck- supple Lungs- Clear to ausculation bilaterally, normal work of breathing Heart- Regular rate and rhythm, no murmurs, rubs or gallops GI- soft, NT, ND, + BS Extremities- no clubbing, cyanosis, or edema Skin- no rash or lesion Psych- euthymic mood, full affect Neuro- strength and sensation are intact  Labs    CBC No results for input(s): WBC, NEUTROABS, HGB, HCT, MCV, PLT in the last 72 hours. Basic Metabolic Panel Recent Labs    11/12/19 0447 11/13/19 0315  NA 131* 132*  K 4.3 4.4  CL 100 102  CO2 24 22  GLUCOSE 83 71  BUN 14 16  CREATININE 0.86 0.90  CALCIUM 8.3* 8.5*  MG 1.7 1.8   Liver Function Tests No results for input(s): AST, ALT, ALKPHOS, BILITOT, PROT, ALBUMIN in the last 72 hours. No results for input(s): LIPASE, AMYLASE  in the last 72 hours. Cardiac Enzymes No results for input(s): CKTOTAL, CKMB, CKMBINDEX, TROPONINI in the last 72 hours.   Telemetry    NSR 80-90s with occasional PVCs (personally reviewed)  Radiology    No results found.  Patient Profile     [facility] Jr[facility]is a 73 y.o.malewith history of chronic systolic CHF, ICM, CAD, h/o spontaneous retroperitoneal bleed, ICD with history of appropriate therapies for VT, and h/o ICD complications including extraction of a left sided device due to "bleeding" per patient.Admitted for pocket hematoma with drainage and likely infection.  Assessment & Plan    1. ICD pocket hematoma with on-going draining concerning for infection BCx NGTD (Had started Keflex prior to draw) TEE planned for this afternoon. OR arranged for this afternoon.  Plan for blood products prior to case  discussed with Dr. Lovena Le, and previously with Dr. Irene Limbo. Pt will get 1 pool (5 units) of cryo starting at NOON, and 2 units of FFP, over 2 hours a piece, starting at 1300. The second unit is expected to be running while patient comes down to OR, pending schedule.  They also recommend optimization of local hemostasis using local interventions such as thrombin gel and pressure dressing.    For questions or updates, please contact [place] Please consult [email or web address] for contact info under Cardiology/STEMI.  Signed, Shirley Friar, PA-C  11/13/2019, 7:58 AM   EP Attending  Patient seen and examined. Agree with the findings as noted above. The patient is stabke today and will be given FFP and crypoprecipitate as recommended by the hematology service. I have reviewed the indications/risks/benefits/goals/expectations of the procedure and he wishes to proceed. Mikle Bosworth.D.

## 2019-11-13 NOTE — Progress Notes (Signed)
Watch, bracelet, wedding ring given to the wife

## 2019-11-13 NOTE — Progress Notes (Signed)
Right radial arterial line removed per MD order.  Pressure held for 20 minutes.  Pressure dressing applied.  Site unremarkable.  Will continue to monitor.

## 2019-11-14 ENCOUNTER — Other Ambulatory Visit (HOSPITAL_COMMUNITY): Payer: Medicare Other

## 2019-11-14 LAB — BPAM CRYOPRECIPITATE
Blood Product Expiration Date: 202011201721
Blood Product Expiration Date: 202011210022
ISSUE DATE / TIME: 202011201158
ISSUE DATE / TIME: 202011201835
Unit Type and Rh: 5100
Unit Type and Rh: 5100

## 2019-11-14 LAB — BPAM FFP
Blood Product Expiration Date: 202011211136
Blood Product Expiration Date: 202011211136
Blood Product Expiration Date: 202011242359
Blood Product Expiration Date: 202011252359
ISSUE DATE / TIME: 202011201317
ISSUE DATE / TIME: 202011201450
ISSUE DATE / TIME: 202011201821
Unit Type and Rh: 5100
Unit Type and Rh: 5100
Unit Type and Rh: 6200
Unit Type and Rh: 6200

## 2019-11-14 LAB — PREPARE FRESH FROZEN PLASMA
Unit division: 0
Unit division: 0
Unit division: 0

## 2019-11-14 LAB — CULTURE, BLOOD (ROUTINE X 2)
Culture: NO GROWTH
Culture: NO GROWTH
Special Requests: ADEQUATE
Special Requests: ADEQUATE

## 2019-11-14 LAB — PREPARE CRYOPRECIPITATE
Unit division: 0
Unit division: 0

## 2019-11-14 LAB — BASIC METABOLIC PANEL
Anion gap: 8 (ref 5–15)
BUN: 14 mg/dL (ref 8–23)
CO2: 23 mmol/L (ref 22–32)
Calcium: 8.4 mg/dL — ABNORMAL LOW (ref 8.9–10.3)
Chloride: 100 mmol/L (ref 98–111)
Creatinine, Ser: 0.81 mg/dL (ref 0.61–1.24)
GFR calc Af Amer: >60 mL/min (ref >60)
GFR calc non Af Amer: >60 mL/min (ref >60)
Glucose, Bld: 186 mg/dL — ABNORMAL HIGH (ref 70–99)
Potassium: 4.2 mmol/L (ref 3.5–5.1)
Sodium: 131 mmol/L — ABNORMAL LOW (ref 135–145)

## 2019-11-14 NOTE — Significant Event (Addendum)
Rapid Response Event Note  Overview:Called d/t bleeding at ICD extraction site. Time Called: 2232 Arrival Time: 2236 Event Type: Other (Comment)(Bleeding site from ICD removal)  Initial Focused Assessment: Pt laying in bed, alert and oriented, c/o pain at ICD extraction site. RN holding pressure on site on my arrival. Site has incision with stitches and a JP drain site below incision. Site is bleeding from both ends of incision and from JP drain site. Moderate size hematoma to site, marked.  JP drain unable to stay charged.HR-85, BP-132/90, RR-21, SpO2-100 on RA. Skin warm and dry. HBG-9.7 on 11-17  Interventions: Manual pressure held x 30 minutes-hemostasis achieved at L side of incision and JP drain site. R side bleeding slowed. Pressure dressing applied to site. Hematoma doesn't appear to be getting bigger.  CBC in AM per MD Plan of Care (if not transferred): ??Need for thrombi pad to site??  Monitor site closely. Call RRT if further assistance needed. Event Summary: Name of Physician Notified: Radford Pax, MD at (PTA RRT)    at          [place], Carren Rang

## 2019-11-14 NOTE — Progress Notes (Signed)
Patient concerned with small amount of blood that came from penis D/T foley catheter. RN explained that could be normal D/T trauma from insertion earlier today. RN will remove and start voiding trial.   Patient also complained that his underarm was bleeding (right side). Pt had fresh red blood on gown, no blood running down his side/abdomen. RN reinforced pressure dressing over right chest and charge JP drain (which had fully inflated w/very small amount of drainage.)

## 2019-11-14 NOTE — Plan of Care (Signed)

## 2019-11-14 NOTE — Progress Notes (Addendum)
Surgical site oozing with blood. MD Caryl Comes already aware of the bleeding, told RN to keep pressure on site. Dressing was saturated. Changed pressure dressing 3x today. MD turner paged.  Awaiting orders. Will continue to monitor.

## 2019-11-14 NOTE — Progress Notes (Signed)
Right side of chest (incision with pressure dressing) is draining blood. RN reinforced dressing again and help pressure. BP 115/87 HR 86

## 2019-11-14 NOTE — Progress Notes (Signed)
Foley removed. Patient due to void.

## 2019-11-14 NOTE — Progress Notes (Addendum)
Electrophysiology Rounding Note  Patient Name: [facility]. Date of Encounter: 11/14/2019  Primary Cardiologist: Dr. Haroldine Laws  Electrophysiologist: Dr. Lovena Le Patient Profile     [facility] a 73 y.o.malewith history of chronic systolic CHF, ICM, CAD, h/o spontaneous retroperitoneal bleed, ICD with history of appropriate therapies for VT Rx wi amiodarone, and h/o ICD complications including extraction of a left sided device due to "bleeding" per patient.Admitted for pocket hematoma with drainage and likely infection. S/p extraction 11/20  TEE 11/12/2019 LVEF 40-45% Subjective    without complaints x some shoulder soreness  No chest pain or shortness of breath     Inpatient Medications    Scheduled Meds:  sodium chloride   Intravenous Once   amiodarone  200 mg Oral Daily   carvedilol  3.125 mg Oral BID WC   Chlorhexidine Gluconate Cloth  6 each Topical Daily   divalproex  500 mg Oral QHS   DULoxetine  30 mg Oral Daily   feeding supplement (ENSURE ENLIVE)  237 mL Oral TID BM   ferrous sulfate  325 mg Oral BID WC   influenza vaccine adjuvanted  0.5 mL Intramuscular Tomorrow-1000   levETIRAcetam  1,000 mg Oral BID   phenytoin  100 mg Oral BID   phytonadione  5 mg Oral Daily   simvastatin  20 mg Oral q1800   sodium chloride flush  3 mL Intravenous Q12H   tamsulosin  0.4 mg Oral Daily   Continuous Infusions:  sodium chloride     sodium chloride 10 mL/hr at 11/14/19 0718   sodium chloride      ceFAZolin (ANCEF) IV Stopped (11/14/19 0647)   PRN Meds: sodium chloride, sodium chloride, Place/Maintain arterial line **AND** sodium chloride, acetaminophen, ondansetron (ZOFRAN) IV, polyethylene glycol, sodium chloride flush, traMADol   Vital Signs    Vitals:   11/14/19 0342 11/14/19 0540 11/14/19 0613 11/14/19 0822  BP: 124/78  115/87 111/81  Pulse: 90  86 88  Resp: 18   18  Temp: 98.7 F (37.1 C)   97.7 F (36.5 C)  TempSrc: Oral    Oral  SpO2: 100%   100%  Weight:  80.7 kg    Height:        Intake/Output Summary (Last 24 hours) at 11/14/2019 1048 Last data filed at 11/14/2019 0928 Gross per 24 hour  Intake 4199.2 ml  Output 1850 ml  Net 2349.2 ml   Filed Weights   11/12/19 0508 11/13/19 0126 11/14/19 0540  Weight: 83 kg 85.1 kg 80.7 kg    Physical Exam    BP 111/81 (BP Location: Left Arm)    Pulse 88    Temp 97.7 F (36.5 C) (Oral)    Resp 18    Ht 5\' 11"  (1.803 m)    Wt 80.7 kg    SpO2 100%    BMI 24.81 kg/m  Well developed and nourished in no acute distress HENT normal Neck supple Pocket without obvious hematoma or bleeding  Clear Regular rate and rhythm, no murmurs or gallops Abd-soft with active BS No Clubbing cyanosis edema Skin-warm and dry A & Oriented  Grossly normal sensory and motor function      Labs    CBC No results for input(s): WBC, NEUTROABS, HGB, HCT, MCV, PLT in the last 72 hours. Basic Metabolic Panel Recent Labs    11/12/19 0447 11/13/19 0315 11/14/19 0450  NA 131* 132* 131*  K 4.3 4.4 4.2  CL 100 102 100  CO2 24 22  23  GLUCOSE 83 71 186*  BUN 14 16 14   CREATININE 0.86 0.90 0.81  CALCIUM 8.3* 8.5* 8.4*  MG 1.7 1.8  --    Liver Function Tests No results for input(s): AST, ALT, ALKPHOS, BILITOT, PROT, ALBUMIN in the last 72 hours. No results for input(s): LIPASE, AMYLASE in the last 72 hours. Cardiac Enzymes No results for input(s): CKTOTAL, CKMB, CKMBINDEX, TROPONINI in the last 72 hours.   Telemetry    Sinus without VT Personally reviewed    Radiology    Dg Chest Port 1 View  Result Date: 11/13/2019 CLINICAL DATA:  ICD pocket infection EXAM: PORTABLE CHEST 1 VIEW COMPARISON:  07/25/2019 FINDINGS: Interval removal of single lead right-sided implanted ICD. Extensive postsurgical changes to the sternum and mediastinum. Mild streaky left basilar atelectasis. Right lung is clear. No pleural effusion. No pneumothorax. IMPRESSION: 1. Interval removal of  single lead implanted ICD. 2. Mild streaky left basilar atelectasis. 3. No pneumothorax. Electronically Signed   By: Davina Poke M.D.   On: 11/13/2019 21:07   Hybrid Or Imaging (mc Only)  Result Date: 11/13/2019 There is no interpretation for this exam.  This order is for images obtained during a surgical procedure.  Please See "Surgeries" Tab for more information regarding the procedure.      Assessment & Plan    1. ICD pocket hematoma with on-going draining concerning for infection  Hx of bleeding and erosion on L side previously and now on R side   ICM interval improvement 15>> 45 %   AF persistent  VT  Factor VII deficiency   hypothyroidism  Bleeding disorder cause uncertain ? Paraproteinemia / liver disease  Will check CBC in am  Heme recommendation >>pressure and possible>> If the patient has life-threatening bleeding postoperatively despite above intervention and optimizing local hemostatic considerations might empirically have to use aPCC -however given his history of A. fib and CVA and CHF he would be at high risk for thrombotic complications.<<   With interval improvement in LV function, perhaps, based on AVID and the anticipated risks assoc with device reimplantation ( ?SICD) would favor the use of amio long term.  I have reviewed this w patient today, but clearly will be decision between GT and DB and pt/family   Mobilize and anticipate home tomorrow or Monday-- will see if GT had preference--We can get him a pressure pal dressing from the office early next week   Check TSH    Signed, Virl Axe, MD  11/14/2019, 10:48 AM

## 2019-11-15 DIAGNOSIS — I5022 Chronic systolic (congestive) heart failure: Secondary | ICD-10-CM

## 2019-11-15 LAB — CBC
HCT: 27.2 % — ABNORMAL LOW (ref 39.0–52.0)
HCT: 24.1 % — ABNORMAL LOW (ref 39.0–52.0)
Hemoglobin: 8.5 g/dL — ABNORMAL LOW (ref 13.0–17.0)
Hemoglobin: 7.7 g/dL — ABNORMAL LOW (ref 13.0–17.0)
MCH: 28.9 pg (ref 26.0–34.0)
MCH: 29.5 pg (ref 26.0–34.0)
MCHC: 31.3 g/dL (ref 30.0–36.0)
MCHC: 32 g/dL (ref 30.0–36.0)
MCV: 92.5 fL (ref 80.0–100.0)
MCV: 92.3 fL (ref 80.0–100.0)
Platelets: 301 10*3/uL (ref 150–400)
Platelets: 281 10*3/uL (ref 150–400)
RBC: 2.94 MIL/uL — ABNORMAL LOW (ref 4.22–5.81)
RBC: 2.61 MIL/uL — ABNORMAL LOW (ref 4.22–5.81)
RDW: 17.4 % — ABNORMAL HIGH (ref 11.5–15.5)
RDW: 17.4 % — ABNORMAL HIGH (ref 11.5–15.5)
WBC: 7.2 10*3/uL (ref 4.0–10.5)
WBC: 10.6 10*3/uL — ABNORMAL HIGH (ref 4.0–10.5)
nRBC: 0 % (ref 0.0–0.2)
nRBC: 0 % (ref 0.0–0.2)

## 2019-11-15 LAB — TYPE AND SCREEN
ABO/RH(D): O POS
Antibody Screen: NEGATIVE
Unit division: 0
Unit division: 0

## 2019-11-15 LAB — BPAM RBC
Blood Product Expiration Date: 202012232359
Blood Product Expiration Date: 202012232359
ISSUE DATE / TIME: 202011191209
ISSUE DATE / TIME: 202011191209
Unit Type and Rh: 5100
Unit Type and Rh: 5100

## 2019-11-15 LAB — TSH: TSH: 0.011 u[IU]/mL — ABNORMAL LOW (ref 0.350–4.500)

## 2019-11-15 LAB — PREPARE RBC (CROSSMATCH)

## 2019-11-15 MED ORDER — FUROSEMIDE 10 MG/ML IJ SOLN
20.0000 mg | Freq: Once | INTRAMUSCULAR | Status: AC
  Administered 2019-11-15: 20 mg via INTRAVENOUS
  Filled 2019-11-15: qty 2

## 2019-11-15 MED ORDER — SODIUM CHLORIDE 0.9% IV SOLUTION: Freq: Once | INTRAVENOUS | Status: DC

## 2019-11-15 MED ORDER — SODIUM CHLORIDE 0.9% IV SOLUTION
Freq: Once | INTRAVENOUS | Status: AC
  Administered 2019-11-15: 12:00:00 via INTRAVENOUS

## 2019-11-15 MED ORDER — HYDROCODONE-ACETAMINOPHEN 5-325 MG PO TABS
1.0000 | ORAL_TABLET | ORAL | Status: DC | PRN
  Administered 2019-11-15 – 2019-11-16 (×4): 1 via ORAL
  Filled 2019-11-15 (×4): qty 1

## 2019-11-15 MED ORDER — SILVER NITRATE-POT NITRATE 75-25 % EX MISC
1.0000 "application " | Freq: Once | CUTANEOUS | Status: AC
  Filled 2019-11-15: qty 1

## 2019-11-15 NOTE — Progress Notes (Addendum)
Called by Dr Neta Mends to see patient because of ongoing bleeding from the wound site which began last night  Heme was also called and I spoke with them   Blood pressure 109/82, pulse 85, temperature 97.9 F (36.6 C), temperature source Oral, resp. rate 18, height 5\' 11"  (1.803 m), weight 85.7 kg, SpO2 99 %.   The bandage was removed; the JP drain was out and there was oozing from that site>> AgNO3 stick cautery successful  There was also oozing superficially from the lateral aspect of the incision-- directed pressure and AgNO3 successful  Pressure pal dressing applied with augment pressure on the lateral incision as noted above  35 minutes of direct pt contact

## 2019-11-15 NOTE — Progress Notes (Signed)
Progress Note  Patient Name: [facility] Date of Encounter: 11/15/2019  Primary Cardiologist: Dr Lovena Le  Subjective   Bleeding from ICD pocket overnight.   Inpatient Medications    Scheduled Meds: . sodium chloride   Intravenous Once  . amiodarone  200 mg Oral Daily  . carvedilol  3.125 mg Oral BID WC  . Chlorhexidine Gluconate Cloth  6 each Topical Daily  . divalproex  500 mg Oral QHS  . DULoxetine  30 mg Oral Daily  . feeding supplement (ENSURE ENLIVE)  237 mL Oral TID BM  . ferrous sulfate  325 mg Oral BID WC  . influenza vaccine adjuvanted  0.5 mL Intramuscular Tomorrow-1000  . levETIRAcetam  1,000 mg Oral BID  . phenytoin  100 mg Oral BID  . phytonadione  5 mg Oral Daily  . simvastatin  20 mg Oral q1800  . sodium chloride flush  3 mL Intravenous Q12H  . tamsulosin  0.4 mg Oral Daily   Continuous Infusions: . sodium chloride    . sodium chloride 1,000 mL (11/14/19 1408)  . sodium chloride    .  ceFAZolin (ANCEF) IV 2 g (11/15/19 0559)   PRN Meds: sodium chloride, sodium chloride, Place/Maintain arterial line **AND** sodium chloride, acetaminophen, ondansetron (ZOFRAN) IV, polyethylene glycol, sodium chloride flush, traMADol   Vital Signs    Vitals:   11/15/19 0600 11/15/19 0632 11/15/19 0700 11/15/19 0846  BP: 134/87 129/81  132/81  Pulse: 76 75  86  Resp:      Temp:      TempSrc:      SpO2:      Weight:   85.7 kg   Height:        Intake/Output Summary (Last 24 hours) at 11/15/2019 0940 Last data filed at 11/15/2019 0559 Gross per 24 hour  Intake 1067.42 ml  Output 1000 ml  Net 67.42 ml   Last 3 Weights 11/15/2019 11/14/2019 11/13/2019  Weight (lbs) 188 lb 15 oz 177 lb 14.4 oz 187 lb 9.6 oz  Weight (kg) 85.7 kg 80.695 kg 85.095 kg      Telemetry    SR - Personally Reviewed  ECG    n/a - Personally Reviewed  Physical Exam   GEN: No acute distress.   Neck: No JVD Cardiac: RRR, no murmurs, rubs, or gallops.  Respiratory: Clear to  auscultation bilaterally. GI: Soft, nontender, non-distended  MS: No edema; No deformity. Neuro:  Nonfocal  Psych: Normal affect   Labs    High Sensitivity Troponin:   Recent Labs  Lab 11/10/19 0755  TROPONINIHS 6      Chemistry Recent Labs  Lab 11/09/19 1440  11/12/19 0447 11/13/19 0315 11/14/19 0450  NA 136   < > 131* 132* 131*  K 3.8   < > 4.3 4.4 4.2  CL 102   < > 100 102 100  CO2 24   < > 24 22 23   GLUCOSE 89   < > 83 71 186*  BUN 19   < > 14 16 14   CREATININE 0.94   < > 0.86 0.90 0.81  CALCIUM 8.5*   < > 8.3* 8.5* 8.4*  PROT 9.1*  --   --   --   --   ALBUMIN 2.6*  --   --   --   --   AST 15  --   --   --   --   ALT 10  --   --   --   --  ALKPHOS 100  --   --   --   --   BILITOT 1.2  --   --   --   --   GFRNONAA >60   < > >60 >60 >60  GFRAA >60   < > >60 >60 >60  ANIONGAP 10   < > 7 8 8    < > = values in this interval not displayed.     Hematology Recent Labs  Lab 11/09/19 1440 11/10/19 0343 11/15/19 0348  WBC 6.2 5.8 7.2  RBC 3.58* 3.42* 2.94*  HGB 10.1* 9.7* 8.5*  HCT 32.7* 30.8* 27.2*  MCV 91.3 90.1 92.5  MCH 28.2 28.4 28.9  MCHC 30.9 31.5 31.3  RDW 17.6* 17.9* 17.4*  PLT 286 266 301    BNPNo results for input(s): BNP, PROBNP in the last 168 hours.   DDimer No results for input(s): DDIMER in the last 168 hours.   Radiology    Dg Chest Port 1 View  Result Date: 11/13/2019 CLINICAL DATA:  ICD pocket infection EXAM: PORTABLE CHEST 1 VIEW COMPARISON:  07/25/2019 FINDINGS: Interval removal of single lead right-sided implanted ICD. Extensive postsurgical changes to the sternum and mediastinum. Mild streaky left basilar atelectasis. Right lung is clear. No pleural effusion. No pneumothorax. IMPRESSION: 1. Interval removal of single lead implanted ICD. 2. Mild streaky left basilar atelectasis. 3. No pneumothorax. Electronically Signed   By: Davina Poke M.D.   On: 11/13/2019 21:07   Hybrid Or Imaging (mc Only)  Result Date: 11/13/2019  There is no interpretation for this exam.  This order is for images obtained during a surgical procedure.  Please See "Surgeries" Tab for more information regarding the procedure.    Cardiac Studies     Patient Profile     [facility] a 73 y.o.malewith history of chronic systolic CHF, ICM, CAD, h/o spontaneous retroperitoneal bleed, ICD with history of appropriate therapies for VT Rx wi amiodarone, and h/o ICD complications including extraction of a left sided device due to "bleeding" per patient.Admitted for pocket hematoma with drainage and likely infection. S/p extraction 11/20  Assessment & Plan    1. ICD pocket infection - Hx of bleeding and erosion on L side previously and now on R side  -s/p extraction 11/20 - from Dr Aquilla Hacker notes plans for amiodarone, consider at f/u if reimplant is warranted given improved LVEF. Patient has had prior VT.  2. Coagulopathy with related bleeding complications - complex and somewhat unclear diagnosis, patient has had bleeding issues with prevoius procedures.  - followed by hematology, there recs were followed this admission. Please see there consult note.   - significant bleeding overnight. Bandaging changed 3 times soaked with blood with some bleeding into bed sheets. From nursing note hematoma with some active bleeding, held pressure intermittently overnight.  - I disucssed with heme/onc, they are to see patient. Potentially to get additional blood products. Discussed with Dr Caryl Comes who will reassess site and redress area.   3. Anemia - Hgb drifting down this admission - Hgb preprocedure 9.8. First f/u Hgb since procedure was this AM at 8.5. Unclear how much was procedure related and how much from active bleeding last night   For questions or updates, please contact [facility] Please consult [email or web address] for contact info under        Signed, Carlyle Dolly, MD  11/15/2019, 9:40 AM

## 2019-11-15 NOTE — Progress Notes (Signed)
   Pt continues to bleed form site. manual pressure applied for about 40 minutes now.    Called blood bank for cryoprecipitate and frozen plasma. Will be ready in 30 minutes. They will call when ready.  Rapid response nurse consulted.

## 2019-11-15 NOTE — Progress Notes (Signed)
While rounding to see Mr. Leotis, Bracey RN at bedside holding pressure laterally to right Chest site. I removed old gauze which was 60-70% saturated with serosanginous drainage and 30% sanginous. No clots. No drainage visualized from incision or JP when gauze was changed. New gauze placed and Pressure pal reapplied.  Will continue to follow. Orders previously received for PRBC and lasix.  98.4 temp, HR 87, 107/78, RR 18 with sats 99% on RA.

## 2019-11-15 NOTE — Progress Notes (Signed)
Hematology follow up note   [facility]   DOB:22-Aug-1946   AQ#:[id number]   PTE#:[id number]  Subjective: Pt is known for bleeding disorder, I met him on his previous hospital admission for severe gum bleeding after teeth extraction.  He has been followed by my partner Dr. Irene Limbo, and was seen by him last week after he was admitted for bleeding from his ICD pocket.  Due to the possible infection and persistent bleeding, ICD was removed 2 days ago. JP drainage tube was placed, which came out yesterday.  Overnight, patient developed severe bleeding from the ICD pocket, soaked the dressing 3 times, and has had a 2 apply manual pressure to the ICD pocket this morning.  Cardiology was changing his dressing when I saw him, incision appears to be clean, he has persistent bleeding at the incision sites and JP site. Wife is at bedside.  He denies any other signs of bleeding.  His right groin procedure site has no bleeding.    Objective:  Vitals:   11/15/19 1043 11/15/19 1159  BP: 111/79 108/77  Pulse: 86 85  Resp: 17 18  Temp: 98.3 F (36.8 C) 98.2 F (36.8 C)  SpO2: 100%     Body mass index is 26.35 kg/m.  Intake/Output Summary (Last 24 hours) at 11/15/2019 1209 Last data filed at 11/15/2019 1048 Gross per 24 hour  Intake 1067.42 ml  Output 1075 ml  Net -7.58 ml     Sclerae unicteric  Skin: no bruise or petechia.  The ICD pocket in the upper right chest wall has active bleeding from the incision and JP drain tube site. There is a golf ball sized hematoma at the ICD pocket.  Neuro nonfocal    CBG (last 3)  No results for input(s): GLUCAP in the last 72 hours.   Labs:  Urine Studies No results for input(s): UHGB, CRYS in the last 72 hours.  Invalid input(s): UACOL, UAPR, USPG, UPH, UTP, UGL, [place], UBIL, UNIT, UROB, [place], UEPI, UWBC, Duwayne Heck [place], [place]  Basic Metabolic Panel: Recent Labs  Lab 11/09/19 1440 11/10/19 0343 11/11/19 0451 11/12/19 0447 11/13/19 0315  11/14/19 0450  NA 136 135 134* 131* 132* 131*  K 3.8 5.2* 3.8 4.3 4.4 4.2  CL 102 103 104 100 102 100  CO2 24 23 25 24 22 23   GLUCOSE 89 82 79 83 71 186*  BUN 19 24* 15 14 16 14   CREATININE 0.94 0.90 0.93 0.86 0.90 0.81  CALCIUM 8.5* 8.2* 8.3* 8.3* 8.5* 8.4*  MG 1.5*  --   --  1.7 1.8  --    GFR Estimated Creatinine Clearance: 90.4 mL/min (by C-G formula based on SCr of 0.81 mg/dL). Liver Function Tests: Recent Labs  Lab 11/09/19 1440  AST 15  ALT 10  ALKPHOS 100  BILITOT 1.2  PROT 9.1*  ALBUMIN 2.6*   No results for input(s): LIPASE, AMYLASE in the last 168 hours. No results for input(s): AMMONIA in the last 168 hours. Coagulation profile Recent Labs  Lab 11/10/19 0945  INR 1.2    CBC: Recent Labs  Lab 11/09/19 1440 11/10/19 0343 11/15/19 0348  WBC 6.2 5.8 7.2  NEUTROABS 4.0  --   --   HGB 10.1* 9.7* 8.5*  HCT 32.7* 30.8* 27.2*  MCV 91.3 90.1 92.5  PLT 286 266 301   Cardiac Enzymes: No results for input(s): CKTOTAL, CKMB, CKMBINDEX, TROPONINI in the last 168 hours. BNP: Invalid input(s): POCBNP CBG: No results for input(s): GLUCAP  in the last 168 hours. D-Dimer No results for input(s): DDIMER in the last 72 hours. Hgb A1c No results for input(s): HGBA1C in the last 72 hours. Lipid Profile No results for input(s): CHOL, HDL, LDLCALC, TRIG, CHOLHDL, LDLDIRECT in the last 72 hours. Thyroid function studies Recent Labs    11/15/19 0348  TSH 0.011*   Anemia work up No results for input(s): VITAMINB12, FOLATE, FERRITIN, TIBC, IRON, RETICCTPCT in the last 72 hours. Microbiology Recent Results (from the past 240 hour(s))  SARS CORONAVIRUS 2 (TAT 6-24 HRS) Nasopharyngeal Nasopharyngeal Swab     Status: None   Collection Time: 11/06/19  2:18 PM   Specimen: Nasopharyngeal Swab  Result Value Ref Range Status   SARS Coronavirus 2 NEGATIVE NEGATIVE Final    Comment: (NOTE) SARS-CoV-2 target nucleic acids are NOT DETECTED. The SARS-CoV-2 RNA is generally  detectable in upper and lower respiratory specimens during the acute phase of infection. Negative results do not preclude SARS-CoV-2 infection, do not rule out co-infections with other pathogens, and should not be used as the sole basis for treatment or other patient management decisions. Negative results must be combined with clinical observations, patient history, and epidemiological information. The expected result is Negative. Fact Sheet for Patients: SugarRoll.be Fact Sheet for Healthcare Providers: https://www.woods-mathews.com/ This test is not yet approved or cleared by the [place] FDA and  has been authorized for detection and/or diagnosis of SARS-CoV-2 by FDA under an Emergency Use Authorization (EUA). This EUA will remain  in effect (meaning this test can be used) for the duration of the COVID-19 declaration under Section 56 4(b)(1) of the Act, 21 U.S.C. section 360bbb-3(b)(1), unless the authorization is terminated or revoked sooner. Performed at [facility], [place] [address] [place], [place] [address]   Culture, blood (routine x 2)     Status: None   Collection Time: 11/09/19  2:10 PM   Specimen: BLOOD RIGHT ARM  Result Value Ref Range Status   Specimen Description BLOOD RIGHT ARM  Final   Special Requests   Final    BOTTLES DRAWN AEROBIC ONLY Blood Culture adequate volume   Culture   Final    NO GROWTH 5 DAYS Performed at [facility], [place] [address] [place], [place] [address]    Report Status 11/14/2019 FINAL  Final  Culture, blood (routine x 2)     Status: None   Collection Time: 11/09/19  2:35 PM   Specimen: BLOOD LEFT FOREARM  Result Value Ref Range Status   Specimen Description BLOOD LEFT FOREARM  Final   Special Requests   Final    BOTTLES DRAWN AEROBIC ONLY Blood Culture adequate volume   Culture   Final    NO GROWTH 5 DAYS Performed at [facility], [place] [address], [place], [place]  [address]    Report Status 11/14/2019 FINAL  Final  SARS CORONAVIRUS 2 (TAT 6-24 HRS) Nasopharyngeal Nasopharyngeal Swab     Status: None   Collection Time: 11/09/19  6:35 PM   Specimen: Nasopharyngeal Swab  Result Value Ref Range Status   SARS Coronavirus 2 NEGATIVE NEGATIVE Final    Comment: (NOTE) SARS-CoV-2 target nucleic acids are NOT DETECTED. The SARS-CoV-2 RNA is generally detectable in upper and lower respiratory specimens during the acute phase of infection. Negative results do not preclude SARS-CoV-2 infection, do not rule out co-infections with other pathogens, and should not be used as the sole basis for treatment or other patient management decisions. Negative results  must be combined with clinical observations, patient history, and epidemiological information. The expected result is Negative. Fact Sheet for Patients: SugarRoll.be Fact Sheet for Healthcare Providers: https://www.woods-mathews.com/ This test is not yet approved or cleared by the [place] FDA and  has been authorized for detection and/or diagnosis of SARS-CoV-2 by FDA under an Emergency Use Authorization (EUA). This EUA will remain  in effect (meaning this test can be used) for the duration of the COVID-19 declaration under Section 56 4(b)(1) of the Act, 21 U.S.C. section 360bbb-3(b)(1), unless the authorization is terminated or revoked sooner. Performed at [facility], [place] [address], [place], [place] [address]   Surgical PCR screen     Status: None   Collection Time: 11/12/19  8:09 PM   Specimen: Nasal Mucosa; Nasal Swab  Result Value Ref Range Status   MRSA, PCR NEGATIVE NEGATIVE Final   Staphylococcus aureus NEGATIVE NEGATIVE Final    Comment: (NOTE) The Xpert SA Assay (FDA approved for NASAL specimens in patients 8 years of age and older), is one component of a comprehensive surveillance program. It is not intended to diagnose infection nor  to guide or monitor treatment. Performed at [facility], [place] [address], [place], [place] [address]       Studies:  Dg Chest Port 1 View  Result Date: 11/13/2019 CLINICAL DATA:  ICD pocket infection EXAM: PORTABLE CHEST 1 VIEW COMPARISON:  07/25/2019 FINDINGS: Interval removal of single lead right-sided implanted ICD. Extensive postsurgical changes to the sternum and mediastinum. Mild streaky left basilar atelectasis. Right lung is clear. No pleural effusion. No pneumothorax. IMPRESSION: 1. Interval removal of single lead implanted ICD. 2. Mild streaky left basilar atelectasis. 3. No pneumothorax. Electronically Signed   By: Davina Poke M.D.   On: 11/13/2019 21:07   Hybrid Or Imaging (mc Only)  Result Date: 11/13/2019 There is no interpretation for this exam.  This order is for images obtained during a surgical procedure.  Please See "Surgeries" Tab for more information regarding the procedure.    Assessment: 73 y.o. who was admitted for persistent bleeding from ICD pocket.  ICD was removed 2 days ago, he had recurrent severe bleeding since last night. No other signs of bleeding.  1. ICD pocket hematoma and active bleeding  2. Coagulopathy, etiology not certain, probably related to his liver disease  3. Anemia from blood loss and iron deficiency  4. IgG Kappa MGUS    Plan:  -cardiology is applying pressure dressing now, which will likely slow down his bleeding -I have ordered 2u FFP and 1 pooled cryo this morning, his nursing is starting when I saw him -will repeat CBC in later afternoon, to see if he needs PRBC transfusion if Hg<8.0 -will repeat PT, APTT, and fibrinogen level in morning -will give lasix 15m with FFP, and another 262mwith PRBC if needed -our service will f/u tomorrow. Pleas call me if any questions.   YaTruitt MerleMD 11/15/2019  12:09 PM  Cell [phone]

## 2019-11-15 NOTE — Plan of Care (Signed)
  Problem: Nutrition: Goal: Adequate nutrition will be maintained Outcome: Completed/Met   Problem: Elimination: Goal: Will not experience complications related to urinary retention Outcome: Completed/Met

## 2019-11-15 NOTE — Progress Notes (Signed)
Pt started bleeding more, RN changed the dressing due to again more bleeding, VS are stable, Rapid Response called for support, MD called as well, which stated to hold more pressure on the site, RN did mark the hemotoma.  The bleeding was coming from the top and the bottom of the incision and the JP drain, which the bulb would not charge, may have contributed to the bleeding.  When Rapid Response arrived, she held pressure for 30 minutes or so and redressed the incision.  This seemed to help, will continue to monitor, Thanks Arvella Nigh RN.

## 2019-11-15 NOTE — Progress Notes (Signed)
Pt's spouse is at bedside.  Surgical site oozing blood, dressing marked. Will continue to monitor area and adding pressure on site.

## 2019-11-15 NOTE — Significant Event (Addendum)
Rapid Response Event Note  Overview: Follow Up - ICD Bleeding  Initial Focused Assessment: While I was rounding, nurse approached with concerns of patient having ongoing bleeding from ICD pocket  which started last night. Per nurse, the site started bleeding again, MD was notified, nurses had held 45 minutes of pressure, patient has known Factor VII deficiency and has had bleeding after other surgical procedures. VSS and patient is alert and oriented x4 . EP MD at bedside. Patient is not in acute distress, respiratory status is stable.   Interventions: -- Assisted MD with dressing change  -- Patient was given 1 round of CRYO and FFP was started.   Plan of Care: -- Nurse to administer FFP as ordered. Please give this in a timely manner, monitor respiratory status since we are giving the patient volume -- Monitor VS  -- Monitor site for bleeding as well   -- F/U 1530 - FFP/CRYO given, site is intact under pressure dressing that MD removed.   Event Summary:  Start Time 1110 End Time [place], [place]

## 2019-11-15 NOTE — Progress Notes (Signed)
Pt started bleeding more again, his JP drain came out as well.  The hematoma seems to be getting more swollen.  We applied 30 minutes of direct pressure which did control the bleeding better, MD notified, asked for a order for a thrombi pad to help as well, will continue to monitor, Thanks Arvella Nigh RN.

## 2019-11-16 ENCOUNTER — Encounter (HOSPITAL_COMMUNITY): Payer: Self-pay | Admitting: Internal Medicine

## 2019-11-16 LAB — BASIC METABOLIC PANEL
Anion gap: 7 (ref 5–15)
BUN: 14 mg/dL (ref 8–23)
CO2: 26 mmol/L (ref 22–32)
Calcium: 8.4 mg/dL — ABNORMAL LOW (ref 8.9–10.3)
Chloride: 100 mmol/L (ref 98–111)
Creatinine, Ser: 0.88 mg/dL (ref 0.61–1.24)
GFR calc Af Amer: >60 mL/min (ref >60)
GFR calc non Af Amer: >60 mL/min (ref >60)
Glucose, Bld: 95 mg/dL (ref 70–99)
Potassium: 3.7 mmol/L (ref 3.5–5.1)
Sodium: 133 mmol/L — ABNORMAL LOW (ref 135–145)

## 2019-11-16 LAB — PREPARE CRYOPRECIPITATE: Unit division: 0

## 2019-11-16 LAB — ECHO INTRAOPERATIVE TEE
Height: 71 in
Weight: 2846.4 oz

## 2019-11-16 LAB — BPAM CRYOPRECIPITATE
Blood Product Expiration Date: 202011221704
ISSUE DATE / TIME: 202011221155
Unit Type and Rh: 5100

## 2019-11-16 LAB — PREPARE FRESH FROZEN PLASMA
Unit division: 0
Unit division: 0

## 2019-11-16 LAB — CBC
HCT: 29.9 % — ABNORMAL LOW (ref 39.0–52.0)
Hemoglobin: 9.4 g/dL — ABNORMAL LOW (ref 13.0–17.0)
MCH: 29.1 pg (ref 26.0–34.0)
MCHC: 31.4 g/dL (ref 30.0–36.0)
MCV: 92.6 fL (ref 80.0–100.0)
Platelets: 257 10*3/uL (ref 150–400)
RBC: 3.23 MIL/uL — ABNORMAL LOW (ref 4.22–5.81)
RDW: 16.7 % — ABNORMAL HIGH (ref 11.5–15.5)
WBC: 5.8 10*3/uL (ref 4.0–10.5)
nRBC: 0 % (ref 0.0–0.2)

## 2019-11-16 LAB — BPAM FFP
Blood Product Expiration Date: 202011272359
Blood Product Expiration Date: 202011272359
ISSUE DATE / TIME: 202011221157
ISSUE DATE / TIME: 202011221157
Unit Type and Rh: 5100
Unit Type and Rh: 5100

## 2019-11-16 LAB — APTT: aPTT: 49 seconds — ABNORMAL HIGH (ref 24–36)

## 2019-11-16 LAB — PROTIME-INR
INR: 1.3 — ABNORMAL HIGH (ref 0.8–1.2)
Prothrombin Time: 16.1 seconds — ABNORMAL HIGH (ref 11.4–15.2)

## 2019-11-16 LAB — FIBRINOGEN: Fibrinogen: 472 mg/dL (ref 210–475)

## 2019-11-16 NOTE — Progress Notes (Signed)
Pt called out due to bleeding in the chest. I then applied pressure to the area for 10 minutes and notified rapid response who came around 2020  Bleeding controled  Coagulation patch ordered   Will continue to monitor

## 2019-11-16 NOTE — Progress Notes (Addendum)
Electrophysiology Rounding Note  Patient Name: Louis Ford. Date of Encounter: 11/16/2019  Primary Cardiologist: Dr. Haroldine Laws  Electrophysiologist: Dr. Lovena Le   Subjective   Sleeping, easily woken.  Slept poorly last night and tired, pain at R chect sit, otherwise denies.  No SOB  TEE 11/12/2019 LVEF 40-45% RIGHT VENTRICLE: Normal size and function. Single chamber ICD wire. There is a nodular density at the insertion of the ICD wire and RV. This appears to be part of the RV trabeculae and NOT a vegetation   Inpatient Medications    Scheduled Meds:  sodium chloride   Intravenous Once   sodium chloride   Intravenous Once   amiodarone  200 mg Oral Daily   carvedilol  3.125 mg Oral BID WC   Chlorhexidine Gluconate Cloth  6 each Topical Daily   divalproex  500 mg Oral QHS   DULoxetine  30 mg Oral Daily   feeding supplement (ENSURE ENLIVE)  237 mL Oral TID BM   ferrous sulfate  325 mg Oral BID WC   influenza vaccine adjuvanted  0.5 mL Intramuscular Tomorrow-1000   levETIRAcetam  1,000 mg Oral BID   phenytoin  100 mg Oral BID   phytonadione  5 mg Oral Daily   simvastatin  20 mg Oral q1800   sodium chloride flush  3 mL Intravenous Q12H   tamsulosin  0.4 mg Oral Daily   Continuous Infusions:  sodium chloride     sodium chloride 250 mL (11/15/19 1548)   sodium chloride      ceFAZolin (ANCEF) IV 2 g (11/16/19 0600)   PRN Meds: sodium chloride, sodium chloride, Place/Maintain arterial line **AND** sodium chloride, acetaminophen, HYDROcodone-acetaminophen, ondansetron (ZOFRAN) IV, polyethylene glycol, sodium chloride flush, traMADol   Vital Signs    Vitals:   11/16/19 0213 11/16/19 0407 11/16/19 0425 11/16/19 0820  BP: 114/83 119/87 122/83 121/89  Pulse: 99 92 93 92  Resp: 18 18 18 18   Temp: 98.5 F (36.9 C) 98.8 F (37.1 C) 98.4 F (36.9 C) 98.1 F (36.7 C)  TempSrc: Oral Oral Oral Oral  SpO2: 98% 99% 100% 100%  Weight:  88 kg    Height:         Intake/Output Summary (Last 24 hours) at 11/16/2019 0854 Last data filed at 11/16/2019 0600 Gross per 24 hour  Intake 1672.5 ml  Output 2425 ml  Net -752.5 ml   Filed Weights   11/14/19 0540 11/15/19 0700 11/16/19 0407  Weight: 80.7 kg 85.7 kg 88 kg    Physical Exam    GEN- The patient is well appearing, alert and oriented x 3 today.   Head- normocephalic, atraumatic Eyes-  Sclera clear, conjunctiva pink Ears- hearing intact Oropharynx- clear Neck- supple Lungs- CTA b/l Heart- RRR no murmurs, rubs or gallops GI- soft, NT, ND Extremities- no clubbing, cyanosis, or edema Skin- no rash or lesion Psych- euthymic mood, full affect Neuro- strength and sensation are intact   R chest: Pocket pal in place.  Pt asks that I NOT remove it, causes him much pain.  I removed one strap to look underneath, I do not appreciate any sign of active bleeding. I will wait for Dr. Lovena Le to remove completely and better evaluate trying to reduce the number of times we do this.  Labs    CBC Recent Labs    11/15/19 1507 11/16/19 0651  WBC 10.6* 5.8  HGB 7.7* 9.4*  HCT 24.1* 29.9*  MCV 92.3 92.6  PLT 281 257  Basic Metabolic Panel Recent Labs    11/14/19 0450 11/16/19 0651  NA 131* 133*  K 4.2 3.7  CL 100 100  CO2 23 26  GLUCOSE 186* 95  BUN 14 14  CREATININE 0.81 0.88  CALCIUM 8.4* 8.4*   Liver Function Tests No results for input(s): AST, ALT, ALKPHOS, BILITOT, PROT, ALBUMIN in the last 72 hours. No results for input(s): LIPASE, AMYLASE in the last 72 hours. Cardiac Enzymes No results for input(s): CKTOTAL, CKMB, CKMBINDEX, TROPONINI in the last 72 hours.   Telemetry    NSR 80-90s with occasional PVCs (personally reviewed)  Radiology    No results found.  Patient Profile     Louis Fordis a 73 y.o.malewith history of chronic systolic CHF, ICM, CAD, h/o spontaneous retroperitoneal bleed, ICD with history of appropriate therapies for VT, and h/o ICD  complications including extraction of a left sided device due to "bleeding" per patient.Admitted for pocket hematoma with drainage and concerns for infection.  Assessment & Plan    1. ICD pocket hematoma with on-going draining concerning for infection 2. 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  His factor VII levels have been normal so likely not a factor VII deficiency.  Bleeding possibly caused by liver disease versus from his monoclonal paraproteinemia      BCx NG x5 days (x2) (Had started Keflex prior to draw)  S/p ICD system extraction 11/13/2019 Ongoing bleeding at extraction site has required recurrent pressure to be held on a couple of occasions Drain out yesterday (seems inadvertently) Yesterday Dr. Caryl Comes applied AgNO3 stick cautery  He had more bleeding last night, RN held pressure, changed dressing  Await hematology visit today  S/p  2u PRBC last night H/H up to 0000000  No groin complication/bleeding  TEE 11/12/2019 LVEF 40-45%   3. ICM     LVEF 40-45% now,  He has had remote h/o appropriate therapies, not planned for life vest given improvement in his EF     Does not appear volume OL currently, getting lasix with blood     Follow closely  4. CAD     No anginal symptoms      For questions or updates, please contact [place] Please consult [email or web address] for contact info under Cardiology/STEMI.  Signed, Baldwin [place], PA-C  11/16/2019, 8:54 AM    EP Attending  Patient seen and examined. Agree with above. The patient is a little better today. He is not actively bleeding on my arrival with pressure being held by his pocket pressure device. He has been transfused. I would anticipate DC home tomorrow if bleeding is stable.  Mikle Bosworth.D.

## 2019-11-16 NOTE — Progress Notes (Signed)
  Pt called nurse because he was bleeding on his chest area.  Nurse applied pressure for about 10 minutes. Bleeding has stopped. Arm sling was not removed.  Pt stated being in pain, pain medication offered to patient. Pt states he wants tylenol. Call bell within reach.

## 2019-11-16 NOTE — Plan of Care (Signed)
  Problem: Education: Goal: Knowledge of General Education information will improve Description: Including pain rating scale, medication(s)/side effects and non-pharmacologic comfort measures Outcome: Progressing   Problem: Health Behavior/Discharge Planning: Goal: Ability to manage health-related needs will improve Outcome: Progressing   Problem: Clinical Measurements: Goal: Ability to maintain clinical measurements within normal limits will improve Outcome: Progressing   Problem: Coping: Goal: Level of anxiety will decrease Outcome: Progressing   Problem: Pain Managment: Goal: General experience of comfort will improve Outcome: Progressing   Problem: Skin Integrity: Goal: Risk for impaired skin integrity will decrease Outcome: Progressing   Problem: Activity: Goal: Capacity to carry out activities will improve Outcome: Progressing

## 2019-11-16 NOTE — Progress Notes (Addendum)
Hematology follow up note   [facility].   DOB:December 17, 1946   XZ:[id number]   UG:[id number]  Subjective:  Mr. Emerick reports ongoing intermittent bleeding from from ICD pocket. Had bleeding overnight and new pressure dressing applied.  States bleeding worsened when he sat up on the side of the bed. Received 2 units FFP and 1 pooled cryoprecipitate 11/15/2019. He thinks bleeding was better in the time period immediately after transfusion. He also received 1 unit PRBC on 11/15/2019 for hemoglobin of 7.7. He has not seen any bleeding other than the bleeding from the ICD pocket.   Objective:  Vitals:   11/16/19 0425 11/16/19 0820  BP: 122/83 121/89  Pulse: 93 92  Resp: 18 18  Temp: 98.4 F (36.9 C) 98.1 F (36.7 C)  SpO2: 100% 100%    Body mass index is 27.06 kg/m.  Intake/Output Summary (Last 24 hours) at 11/16/2019 1143 Last data filed at 11/16/2019 0900 Gross per 24 hour  Intake 1552.5 ml  Output 2200 ml  Net -647.5 ml    General: Awake and alert, no distress  Sclerae unicteric  Skin: no bruise or petechia. Pressure dressing to right upper chest in place. I did not see any active bleeding, but patient had dry blood on sheet and on skin near ICD pocket.    Neuro nonfocal    CBG (last 3)  No results for input(s): GLUCAP in the last 72 hours.   Labs:  Urine Studies No results for input(s): UHGB, CRYS in the last 72 hours.  Invalid input(s): UACOL, UAPR, USPG, UPH, UTP, UGL, UKET, UBIL, UNIT, UROB, [place], UEPI, UWBC, [place], [place], [place], [place], [place]  Basic Metabolic Panel: Recent Labs  Lab 11/09/19 1440  11/11/19 0451 11/12/19 0447 11/13/19 0315 11/14/19 0450 11/16/19 0651  NA 136   < > 134* 131* 132* 131* 133*  K 3.8   < > 3.8 4.3 4.4 4.2 3.7  CL 102   < > 104 100 102 100 100  CO2 24   < > 25 24 22 23 26   GLUCOSE 89   < > 79 83 71 186* 95  BUN 19   < > 15 14 16 14 14   CREATININE 0.94   < > 0.93 0.86 0.90 0.81 0.88  CALCIUM 8.5*   < > 8.3* 8.3* 8.5* 8.4* 8.4*  MG  1.5*  --   --  1.7 1.8  --   --    < > = values in this interval not displayed.   GFR Estimated Creatinine Clearance: 83.2 mL/min (by C-G formula based on SCr of 0.88 mg/dL). Liver Function Tests: Recent Labs  Lab 11/09/19 1440  AST 15  ALT 10  ALKPHOS 100  BILITOT 1.2  PROT 9.1*  ALBUMIN 2.6*   No results for input(s): LIPASE, AMYLASE in the last 168 hours. No results for input(s): AMMONIA in the last 168 hours. Coagulation profile Recent Labs  Lab 11/10/19 0945 11/16/19 0914  INR 1.2 1.3*    CBC: Recent Labs  Lab 11/09/19 1440 11/10/19 0343 11/15/19 0348 11/15/19 1507 11/16/19 0651  WBC 6.2 5.8 7.2 10.6* 5.8  NEUTROABS 4.0  --   --   --   --   HGB 10.1* 9.7* 8.5* 7.7* 9.4*  HCT 32.7* 30.8* 27.2* 24.1* 29.9*  MCV 91.3 90.1 92.5 92.3 92.6  PLT 286 266 301 281 257   Cardiac Enzymes: No results for input(s): CKTOTAL, CKMB, CKMBINDEX, TROPONINI in the last 168 hours. BNP: Invalid input(s): POCBNP CBG:  No results for input(s): GLUCAP in the last 168 hours. D-Dimer No results for input(s): DDIMER in the last 72 hours. Hgb A1c No results for input(s): HGBA1C in the last 72 hours. Lipid Profile No results for input(s): CHOL, HDL, LDLCALC, TRIG, CHOLHDL, LDLDIRECT in the last 72 hours. Thyroid function studies Recent Labs    11/15/19 0348  TSH 0.011*   Anemia work up No results for input(s): VITAMINB12, FOLATE, FERRITIN, TIBC, IRON, RETICCTPCT in the last 72 hours. Microbiology Recent Results (from the past 240 hour(s))  SARS CORONAVIRUS 2 (TAT 6-24 HRS) Nasopharyngeal Nasopharyngeal Swab     Status: None   Collection Time: 11/06/19  2:18 PM   Specimen: Nasopharyngeal Swab  Result Value Ref Range Status   SARS Coronavirus 2 NEGATIVE NEGATIVE Final    Comment: (NOTE) SARS-CoV-2 target nucleic acids are NOT DETECTED. The SARS-CoV-2 RNA is generally detectable in upper and lower respiratory specimens during the acute phase of infection. Negative results do  not preclude SARS-CoV-2 infection, do not rule out co-infections with other pathogens, and should not be used as the sole basis for treatment or other patient management decisions. Negative results must be combined with clinical observations, patient history, and epidemiological information. The expected result is Negative. Fact Sheet for Patients: SugarRoll.be Fact Sheet for Healthcare Providers: https://www.woods-mathews.com/ This test is not yet approved or cleared by the [place] FDA and  has been authorized for detection and/or diagnosis of SARS-CoV-2 by FDA under an Emergency Use Authorization (EUA). This EUA will remain  in effect (meaning this test can be used) for the duration of the COVID-19 declaration under Section 56 4(b)(1) of the Act, 21 U.S.C. section 360bbb-3(b)(1), unless the authorization is terminated or revoked sooner. Performed at [facility], [place] [address], [place], [place] [address]   Culture, blood (routine x 2)     Status: None   Collection Time: 11/09/19  2:10 PM   Specimen: BLOOD RIGHT ARM  Result Value Ref Range Status   Specimen Description BLOOD RIGHT ARM  Final   Special Requests   Final    BOTTLES DRAWN AEROBIC ONLY Blood Culture adequate volume   Culture   Final    NO GROWTH 5 DAYS Performed at [facility], [place] [address], [place], [place] [address]    Report Status 11/14/2019 FINAL  Final  Culture, blood (routine x 2)     Status: None   Collection Time: 11/09/19  2:35 PM   Specimen: BLOOD LEFT FOREARM  Result Value Ref Range Status   Specimen Description BLOOD LEFT FOREARM  Final   Special Requests   Final    BOTTLES DRAWN AEROBIC ONLY Blood Culture adequate volume   Culture   Final    NO GROWTH 5 DAYS Performed at [facility], [place] [address], [place], [place] [address]    Report Status 11/14/2019 FINAL  Final  SARS CORONAVIRUS 2 (TAT 6-24 HRS) Nasopharyngeal  Nasopharyngeal Swab     Status: None   Collection Time: 11/09/19  6:35 PM   Specimen: Nasopharyngeal Swab  Result Value Ref Range Status   SARS Coronavirus 2 NEGATIVE NEGATIVE Final    Comment: (NOTE) SARS-CoV-2 target nucleic acids are NOT DETECTED. The SARS-CoV-2 RNA is generally detectable in upper and lower respiratory specimens during the acute phase of infection. Negative results do not preclude SARS-CoV-2 infection, do not rule out co-infections with other pathogens, and should not be used as the sole basis for treatment or other  patient management decisions. Negative results must be combined with clinical observations, patient history, and epidemiological information. The expected result is Negative. Fact Sheet for Patients: SugarRoll.be Fact Sheet for Healthcare Providers: https://www.woods-mathews.com/ This test is not yet approved or cleared by the [place] FDA and  has been authorized for detection and/or diagnosis of SARS-CoV-2 by FDA under an Emergency Use Authorization (EUA). This EUA will remain  in effect (meaning this test can be used) for the duration of the COVID-19 declaration under Section 56 4(b)(1) of the Act, 21 U.S.C. section 360bbb-3(b)(1), unless the authorization is terminated or revoked sooner. Performed at [facility], [place] [address], [place], [place] [address]   Surgical PCR screen     Status: None   Collection Time: 11/12/19  8:09 PM   Specimen: Nasal Mucosa; Nasal Swab  Result Value Ref Range Status   MRSA, PCR NEGATIVE NEGATIVE Final   Staphylococcus aureus NEGATIVE NEGATIVE Final    Comment: (NOTE) The Xpert SA Assay (FDA approved for NASAL specimens in patients 8 years of age and older), is one component of a comprehensive surveillance program. It is not intended to diagnose infection nor to guide or monitor treatment. Performed at [facility], [place] [address], [place],  [place] [address]       Studies:  No results found.  Assessment: 73 y.o. who was admitted for persistent bleeding from ICD pocket.  ICD was removed 11/13/2019, he has had recurrent intermittent bleeding from right chest ICD pocket. No other signs of bleeding.  1. ICD pocket hematoma with intermittent active bleeding  2. Coagulopathy, etiology not certain, probably related to his liver disease  3. Anemia from blood loss and iron deficiency  4. IgG Kappa MGUS    Plan:  -Labs from this am reviewed. Hemoglobin from this am improved to 9.4. No PRBC transfusion indicated today. Fibrinogen normal. PT/INR elevated at 16.1/1,3. PTT elevated.  Labs from 11/10/2019 showed a normal factor VII assay at 126, mildly decreased factor V activity at 62, normal thrombin time 18.3. Will review labs with Dr. Lorenso Courier (on-call for Medical Oncology). May need to repeat FFP and cryo administration. He has required Vitamin K 5 mg PO daily in the past for persistent bleeding gums and may need to consider this as well.   Mikey Bussing, NP 11/16/2019  11:43 AM   I have seen the patient and read the note above. I agree with the assessment and plan.   In summation, Mr. Sridhar is a 73 year old male with an unknown bleeding disorder who presents following an ICD removal. He was previously diagnosed with FVII deficiency, but on subsequent check here has been found to have normal levels. He continues to have oozing from his surgical site requiring 3 units of cryo and 5 units of FFP. Yesterday he received 1 unit PRBC as well. He continues to ooze from the surgical site, including on my exam today. A summary of his pertinent lab work is listed below:  11/10/2019 PTT failure to correct.  Normal thrombin time Factor V 62% Factor VII 126% Fibrinogen 472 3 units of cryo 11/20 x 2, 11/22 5 units FFP 11/20 x 3, 11/22 x 2 PRBC x 1 unit 11/15/19  07/29/2019:  Factor X: 95% Factor XIII: NML Factor II: 82% Factor VIII:  182% Platelet function study: nml vWD panel: nml, multimers nml  At this time his labs would be affected by the FPP and cryo he has received. Additional labs to consider  would be a fibrinogen panel (not just quantitative, but a qualitative assessment to determine if there is dysfibrinogenemia), Vitamin C level (poor wound healing with Vitamin C deficiency), and repeat FVII level to determine if the last check at 126% was not accurate. Vitamin C could be performed now, but I would hold on the dysfibrinogenemia assessment as the results would not likely be accurate. I agree with having the patient f/u at [facility] for a second opinion.   In the interim our focus should be on hemostasis. Please continue FFP/cryo PRN with PRBC for Hgb <8.0. I do not recommend d/c until there is control over the bleeding. Please do not hesistate to call for any questions or concerns regarding the care of this patient.  Ledell Peoples, MD Department of Hematology/Oncology [place] at [facility] Phone: [phone] Pager: [phone] Email: [email or web address]

## 2019-11-16 NOTE — Progress Notes (Signed)
Notified by staff of recurrent oozing from Right chest incision.  Dressing removed and saturated with serosanginous dng with small clots. Small ooze from lateral aspect of incision. Fibrin patch applied and dressing replaced with cool compress and pressure pal. Pt tolerated well.

## 2019-11-16 NOTE — Progress Notes (Signed)
  Pt ambulated to bathroom with nurse tech. Nurse tech called nurse because pt had bleeding in chest area. Pt was helped back to bed and pressure was applied.  Bleeding stopped.  Pt is resting in bed. Call bell within reach.   Earlier today pt refused to sit in his chair.   Pt verbalized feeling worried about going home, emotional support provided to patient. Questions and concerns were answered.

## 2019-11-17 LAB — TYPE AND SCREEN
ABO/RH(D): O POS
Antibody Screen: NEGATIVE
Unit division: 0
Unit division: 0

## 2019-11-17 LAB — CBC
HCT: 29.7 % — ABNORMAL LOW (ref 39.0–52.0)
Hemoglobin: 9.6 g/dL — ABNORMAL LOW (ref 13.0–17.0)
MCH: 29.3 pg (ref 26.0–34.0)
MCHC: 32.3 g/dL (ref 30.0–36.0)
MCV: 90.5 fL (ref 80.0–100.0)
Platelets: 275 10*3/uL (ref 150–400)
RBC: 3.28 MIL/uL — ABNORMAL LOW (ref 4.22–5.81)
RDW: 16.8 % — ABNORMAL HIGH (ref 11.5–15.5)
WBC: 5.9 10*3/uL (ref 4.0–10.5)
nRBC: 0 % (ref 0.0–0.2)

## 2019-11-17 LAB — BPAM RBC
Blood Product Expiration Date: 202012232359
Blood Product Expiration Date: 202012232359
ISSUE DATE / TIME: 202011222136
ISSUE DATE / TIME: 202011230142
Unit Type and Rh: 5100
Unit Type and Rh: 5100

## 2019-11-17 LAB — BASIC METABOLIC PANEL
Anion gap: 8 (ref 5–15)
BUN: 16 mg/dL (ref 8–23)
CO2: 26 mmol/L (ref 22–32)
Calcium: 8.6 mg/dL — ABNORMAL LOW (ref 8.9–10.3)
Chloride: 101 mmol/L (ref 98–111)
Creatinine, Ser: 1.12 mg/dL (ref 0.61–1.24)
GFR calc Af Amer: >60 mL/min (ref >60)
GFR calc non Af Amer: >60 mL/min (ref >60)
Glucose, Bld: 82 mg/dL (ref 70–99)
Potassium: 3.9 mmol/L (ref 3.5–5.1)
Sodium: 135 mmol/L (ref 135–145)

## 2019-11-17 LAB — POCT I-STAT 7, (LYTES, BLD GAS, ICA,H+H)
Acid-base deficit: 2 mmol/L (ref 0.0–2.0)
Bicarbonate: 22.3 mmol/L (ref 20.0–28.0)
Calcium, Ion: 1.19 mmol/L (ref 1.15–1.40)
HCT: 30 % — ABNORMAL LOW (ref 39.0–52.0)
Hemoglobin: 10.2 g/dL — ABNORMAL LOW (ref 13.0–17.0)
O2 Saturation: 100 %
Potassium: 3.5 mmol/L (ref 3.5–5.1)
Sodium: 139 mmol/L (ref 135–145)
TCO2: 23 mmol/L (ref 22–32)
pCO2 arterial: 37.5 mmHg (ref 32.0–48.0)
pH, Arterial: 7.382 (ref 7.350–7.450)
pO2, Arterial: 269 mmHg — ABNORMAL HIGH (ref 83.0–108.0)

## 2019-11-17 MED ORDER — SODIUM CHLORIDE 0.9% IV SOLUTION
Freq: Once | INTRAVENOUS | Status: AC
  Administered 2019-11-17: 10:00:00 via INTRAVENOUS

## 2019-11-17 NOTE — Progress Notes (Addendum)
Electrophysiology Rounding Note  Patient Name: [facility]. Date of Encounter: 11/17/2019  Primary Cardiologist: Dr. Haroldine Laws  Electrophysiologist: Dr. Lovena Le   Subjective   Feels OK, mentions the bleeding makes him scared.  But feels ok.   TEE 11/12/2019 LVEF 40-45% RIGHT VENTRICLE: Normal size and function. Single chamber ICD wire. There is a nodular density at the insertion of the ICD wire and RV. This appears to be part of the RV trabeculae and NOT a vegetation   Inpatient Medications    Scheduled Meds: . sodium chloride   Intravenous Once  . sodium chloride   Intravenous Once  . sodium chloride   Intravenous Once  . amiodarone  200 mg Oral Daily  . carvedilol  3.125 mg Oral BID WC  . Chlorhexidine Gluconate Cloth  6 each Topical Daily  . divalproex  500 mg Oral QHS  . DULoxetine  30 mg Oral Daily  . feeding supplement (ENSURE ENLIVE)  237 mL Oral TID BM  . ferrous sulfate  325 mg Oral BID WC  . influenza vaccine adjuvanted  0.5 mL Intramuscular Tomorrow-1000  . levETIRAcetam  1,000 mg Oral BID  . phenytoin  100 mg Oral BID  . simvastatin  20 mg Oral q1800  . sodium chloride flush  3 mL Intravenous Q12H  . tamsulosin  0.4 mg Oral Daily   Continuous Infusions: . sodium chloride    . sodium chloride 250 mL (11/16/19 1442)  . sodium chloride    .  ceFAZolin (ANCEF) IV 2 g (11/17/19 0518)   PRN Meds: sodium chloride, sodium chloride, Place/Maintain arterial line **AND** sodium chloride, acetaminophen, HYDROcodone-acetaminophen, ondansetron (ZOFRAN) IV, polyethylene glycol, sodium chloride flush, traMADol   Vital Signs    Vitals:   11/16/19 1444 11/16/19 1658 11/16/19 2013 11/17/19 0440  BP: 113/76 115/82 125/87 136/89  Pulse: 89 87 87 97  Resp:  20 20 20   Temp:  98.4 F (36.9 C) 99.3 F (37.4 C) 98.7 F (37.1 C)  TempSrc:  Oral Oral Oral  SpO2: 97% 100% 100% 99%  Weight:    85 kg  Height:        Intake/Output Summary (Last 24 hours) at  11/17/2019 0818 Last data filed at 11/17/2019 0817 Gross per 24 hour  Intake 818.61 ml  Output 1060 ml  Net -241.39 ml   Filed Weights   11/15/19 0700 11/16/19 0407 11/17/19 0440  Weight: 85.7 kg 88 kg 85 kg    Physical Exam    GEN- The patient is well appearing, alert and oriented x 3 today.   Head- normocephalic, atraumatic Eyes-  Sclera clear, conjunctiva pink Ears- hearing intact Oropharynx- clear Neck- supple Lungs- CTA b/l Heart- RRR no murmurs, rubs or gallops GI- soft, NT, ND Extremities- no clubbing, cyanosis, or edema Skin- no rash or lesion Psych- euthymic mood, full affect Neuro- strength and sensation are intact   R chest: Pocket pal in place.   Dr. Lovena Le at bedside, not removed, though adjusted to look, no signs of active bleeding.  Instructed not to remove unless noted bleeding  Labs    CBC Recent Labs    11/16/19 0651 11/17/19 0409  WBC 5.8 5.9  HGB 9.4* 9.6*  HCT 29.9* 29.7*  MCV 92.6 90.5  PLT 257 [id number]   Basic Metabolic Panel Recent Labs    11/16/19 0651 11/17/19 0409  NA 133* 135  K 3.7 3.9  CL 100 101  CO2 26 26  GLUCOSE 95 82  BUN 14 16  CREATININE 0.88 1.12  CALCIUM 8.4* 8.6*   Liver Function Tests No results for input(s): AST, ALT, ALKPHOS, BILITOT, PROT, ALBUMIN in the last 72 hours. No results for input(s): LIPASE, AMYLASE in the last 72 hours. Cardiac Enzymes No results for input(s): CKTOTAL, CKMB, CKMBINDEX, TROPONINI in the last 72 hours.   Telemetry    NSR 80-90s with occasional PVCs (personally reviewed)  Radiology    No results found.  Patient Profile     [facility]is a 73 y.o.malewith history of chronic systolic CHF, ICM, CAD, h/o spontaneous retroperitoneal bleed, ICD with history of appropriate therapies for VT, and h/o ICD complications including extraction of a left sided device due to "bleeding" per patient.Admitted for pocket hematoma with drainage and concerns for infection.  Assessment &  Plan    1. ICD pocket hematoma with on-going draining concerning for infection 2. 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  His factor VII levels have been normal so likely not a factor VII deficiency.  Bleeding possibly caused by liver disease versus from his monoclonal paraproteinemia      BCx NG x5 days (x2) (Had started Keflex prior to draw)  S/p ICD system extraction 11/13/2019 Ongoing bleeding at extraction site has required recurrent pressure to be held on a couple of occasions, again last night for recurrent oozing, pressure was applied and bleeding controlled, coag patch and dressing re-applied, pocket pal replaced with cool compress Drain out 11/22/220(seems inadvertently) Dr. Caryl Comes applied AgNO3 stick cautery 11/15/2019   Appreciate hematology  Signed off, recommended not to discharge until bleeding has stopped PRN FFP, cryo, and PRBC for Hgb <8  No groin remains stable, no complication/bleeding  TEE 11/12/2019 LVEF 40-45%   3. ICM     LVEF 40-45% now,  He has had remote h/o appropriate therapies, not planned for life vest given improvement in his EF     Does not appear volume OL currently, getting lasix with blood     Follow closely  4. CAD     No anginal symptoms      Dr. Lovena Le discussed with the patient unable to discharge until no bleeding, he understands, and prefers that, left a message for his wife on her phone as well. Given oozing last PM, will give 2 u FFP today Labs in AM   For questions or updates, please contact [place] Please consult [email or web address] for contact info under Cardiology/STEMI.  Signed, Baldwin Jamaica, PA-C  11/17/2019, 8:18 AM   EP Attending  Patient seen and examined. Agree with above. The patient had more bleeding last night. Was given FFP. He is not bleeding this morning. His Hgb is stable. We will continue with supportive care. We  will DC home once his bleeding has subsided.   Mikle Bosworth.D.

## 2019-11-17 NOTE — Plan of Care (Signed)
  Problem: Coping: Goal: Level of anxiety will decrease Outcome: Progressing   Problem: Pain Managment: Goal: General experience of comfort will improve Outcome: Progressing   

## 2019-11-17 NOTE — Significant Event (Signed)
Rapid Response Event Note  I received a call from the nurse with concerns of ongoing bleeding from ICD site. I asked RN to page EP provider and that I would come see the patient. I went to see the patient, took down the old dressing, there was no new active bleeding, the drainage was from the old gauze. Patient was stable, receiving FFP.   Cherl Gorney R

## 2019-11-18 LAB — BASIC METABOLIC PANEL
Anion gap: 6 (ref 5–15)
BUN: 11 mg/dL (ref 8–23)
CO2: 26 mmol/L (ref 22–32)
Calcium: 9 mg/dL (ref 8.9–10.3)
Chloride: 101 mmol/L (ref 98–111)
Creatinine, Ser: 0.77 mg/dL (ref 0.61–1.24)
GFR calc Af Amer: >60 mL/min (ref >60)
GFR calc non Af Amer: >60 mL/min (ref >60)
Glucose, Bld: 82 mg/dL (ref 70–99)
Potassium: 3.9 mmol/L (ref 3.5–5.1)
Sodium: 133 mmol/L — ABNORMAL LOW (ref 135–145)

## 2019-11-18 LAB — BPAM FFP
Blood Product Expiration Date: 202011282359
Blood Product Expiration Date: 202011282359
ISSUE DATE / TIME: 202011241039
Unit Type and Rh: 5100
Unit Type and Rh: 9500

## 2019-11-18 LAB — CBC
HCT: 32.3 % — ABNORMAL LOW (ref 39.0–52.0)
Hemoglobin: 10.3 g/dL — ABNORMAL LOW (ref 13.0–17.0)
MCH: 29.2 pg (ref 26.0–34.0)
MCHC: 31.9 g/dL (ref 30.0–36.0)
MCV: 91.5 fL (ref 80.0–100.0)
Platelets: 265 10*3/uL (ref 150–400)
RBC: 3.53 MIL/uL — ABNORMAL LOW (ref 4.22–5.81)
RDW: 16.5 % — ABNORMAL HIGH (ref 11.5–15.5)
WBC: 5.6 10*3/uL (ref 4.0–10.5)
nRBC: 0 % (ref 0.0–0.2)

## 2019-11-18 LAB — PREPARE FRESH FROZEN PLASMA: Unit division: 0

## 2019-11-18 NOTE — Progress Notes (Addendum)
Electrophysiology Rounding Note  Patient Name: [facility]. Date of Encounter: 11/18/2019  Primary Cardiologist: Dr. Haroldine Laws  Electrophysiologist: Dr. Lovena Le   Subjective   Feels OK, tired, does not sleep well here, no other complaints, taking tylenol only for site discomfort   TEE 11/12/2019 LVEF 40-45% RIGHT VENTRICLE: Normal size and function. Single chamber ICD wire. There is a nodular density at the insertion of the ICD wire and RV. This appears to be part of the RV trabeculae and NOT a vegetation   Inpatient Medications    Scheduled Meds: . sodium chloride   Intravenous Once  . sodium chloride   Intravenous Once  . amiodarone  200 mg Oral Daily  . carvedilol  3.125 mg Oral BID WC  . Chlorhexidine Gluconate Cloth  6 each Topical Daily  . divalproex  500 mg Oral QHS  . DULoxetine  30 mg Oral Daily  . feeding supplement (ENSURE ENLIVE)  237 mL Oral TID BM  . ferrous sulfate  325 mg Oral BID WC  . influenza vaccine adjuvanted  0.5 mL Intramuscular Tomorrow-1000  . levETIRAcetam  1,000 mg Oral BID  . phenytoin  100 mg Oral BID  . simvastatin  20 mg Oral q1800  . sodium chloride flush  3 mL Intravenous Q12H  . tamsulosin  0.4 mg Oral Daily   Continuous Infusions: . sodium chloride Stopped (11/17/19 2053)  . sodium chloride 1,000 mL (11/18/19 0534)  . sodium chloride Stopped (11/17/19 2053)  .  ceFAZolin (ANCEF) IV 2 g (11/18/19 0535)   PRN Meds: sodium chloride, sodium chloride, Place/Maintain arterial line **AND** sodium chloride, acetaminophen, HYDROcodone-acetaminophen, ondansetron (ZOFRAN) IV, polyethylene glycol, sodium chloride flush, traMADol   Vital Signs    Vitals:   11/17/19 1611 11/17/19 1956 11/18/19 0539 11/18/19 0654  BP: 118/81 114/80 117/80 117/89  Pulse: 93 89 88 90  Resp: 19 18 20    Temp: (!) 97.4 F (36.3 C) 98.8 F (37.1 C) 97.6 F (36.4 C)   TempSrc: Oral Oral Oral   SpO2: 100% 99% 99%   Weight:   85.8 kg   Height:         Intake/Output Summary (Last 24 hours) at 11/18/2019 0834 Last data filed at 11/18/2019 0539 Gross per 24 hour  Intake 2112.63 ml  Output 2450 ml  Net -337.37 ml   Filed Weights   11/16/19 0407 11/17/19 0440 11/18/19 0539  Weight: 88 kg 85 kg 85.8 kg    Physical Exam    GEN- The patient is well appearing, alert and oriented x 3 today.   Head- normocephalic, atraumatic Eyes-  Sclera clear, conjunctiva pink Ears- hearing intact Oropharynx- clear Neck- supple Lungs- CTA b/l Heart- RRR no murmurs, rubs or gallops GI- soft, NT, ND Extremities- no clubbing, cyanosis, or edema Skin- no rash or lesion Psych- euthymic mood, full affect Neuro- strength and sensation are intact   R chest Dressing removed Only a very slight oozing with removal of gauze.  Site is clean, sutures in place.  Has very slight oozing of blood at lateral edge, small skin flap there.  No signs of infection Area is cleaned, new pocket pal with cold compress is placed  Labs    CBC Recent Labs    11/17/19 0409 11/18/19 0423  WBC 5.9 5.6  HGB 9.6* 10.3*  HCT 29.7* 32.3*  MCV 90.5 91.5  PLT 275 [id number]   Basic Metabolic Panel Recent Labs    11/17/19 0409 11/18/19 0423  NA 135 133*  K 3.9 3.9  CL 101 101  CO2 26 26  GLUCOSE 82 82  BUN 16 11  CREATININE 1.12 0.77  CALCIUM 8.6* 9.0   Liver Function Tests No results for input(s): AST, ALT, ALKPHOS, BILITOT, PROT, ALBUMIN in the last 72 hours. No results for input(s): LIPASE, AMYLASE in the last 72 hours. Cardiac Enzymes No results for input(s): CKTOTAL, CKMB, CKMBINDEX, TROPONINI in the last 72 hours.   Telemetry    NSR 80-90s with occasional PVCs (personally reviewed)  Radiology    No results found.  Patient Profile     [facility]is a 73 y.o.malewith history of chronic systolic CHF, ICM, CAD, h/o spontaneous retroperitoneal bleed, ICD with history of appropriate therapies for VT, and h/o ICD complications including extraction of  a left sided device due to "bleeding" per patient.Admitted for pocket hematoma with drainage and concerns for infection.  Assessment & Plan    1. ICD pocket hematoma with on-going draining concerning for infection 2. 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  His factor VII levels have been normal so likely not a factor VII deficiency.  Bleeding possibly caused by liver disease versus from his monoclonal paraproteinemia      BCx NG x5 days (x2) (Had started Keflex prior to draw)  S/p ICD system extraction 11/13/2019 Ongoing bleeding at extraction site has required recurrent pressure to be held on a couple of occasions, again last night for recurrent oozing, pressure was applied and bleeding controlled, coag patch and dressing re-applied, pocket pal replaced with cool compress Drain out 11/22/220(seems inadvertently) Dr. Caryl Comes applied AgNO3 stick cautery 11/15/2019 Bleeding 09/16/2019 evening FFP x2 yesterday > no bleeding yesterday RN noted some bleeding this AM from under dressing  Slight oozing with dressing removal today, no pressure required New pocket pal applied FFP x 2  today   Appreciate hematology  Signed off, recommended not to discharge until bleeding has stopped PRN FFP, cryo, and PRBC for Hgb <8  No groin remains stable, no complication/bleeding  TEE 11/12/2019 LVEF 40-45%   3. ICM     LVEF 40-45% now,  He has had remote h/o appropriate therapies, not planned for life vest given improvement in his EF     Does not appear volume OL currently     Follow closely  4. CAD     No anginal symptoms   Hopefully can discharge soon, though will need to not require FFP, and have no bleeding, will get PT to see him today and assess physical needs, and make sure no bleeding with activity.        For questions or updates, please contact [place] Please consult [email or web address] for  contact info under Cardiology/STEMI.  Signed, Baldwin Jamaica, PA-C  11/18/2019, 8:34 AM   EP attending  Patient seen and examined. Agree with above. The patient had some bleeding this morning but is not actively bleeding. The patient has not gotten up. We will give 2 units of FFP and ambulate.   Mikle Bosworth.D.

## 2019-11-18 NOTE — Progress Notes (Signed)
Patient called RN and said his surgical site (right chest) was bleeding again. RN came to room and there was fresh, red blood on the pillow case. When RN removed pressure device, there was blood soaked gauze, but not further bleeding after gauze was removed. RN redressed site and and helped patient to calm down as the incident made him very anxious.  Patient cleaned, pain meds given.

## 2019-11-18 NOTE — Progress Notes (Signed)
Come to check on pt. Saw the pressure device dislocated and there was moderate amount of fresh red blood on the gauzes. Removed the pressure device, changed dressing, then reapplied the pressure device.

## 2019-11-18 NOTE — Progress Notes (Signed)
Saw a small amount of new blood underneath patient's pressure device. Removed pressure and changed the gauze. Pressure device reapplied. Will continue to monitor.

## 2019-11-18 NOTE — Progress Notes (Signed)
Patient is requesting a new pressure device with Velcro. The one he has is saturated with blood.

## 2019-11-18 NOTE — Care Management Important Message (Signed)
Important Message  Patient Details  Name: [facility]. MRN: [id number] Date of Birth: 1946/07/22   Medicare Important Message Given:  Yes     Shelda Altes 11/18/2019, 1:26 PM

## 2019-11-18 NOTE — Evaluation (Signed)
Physical Therapy Evaluation Patient Details Name: [facility]. MRN: [id number] DOB: 02-Nov-1946 Today's Date: 11/18/2019   History of Present Illness  Pt is a 73 y.o. male admitted 11/09/19 with ICD pocket hematoma with ongoing draining concerning for infection. S/p TEE 11/19. S/p ICD system extraction 11/20. PMH includes CHF, ICM, CAD, ICD with h/o complications.    Clinical Impression  Pt presents with an overall decrease in functional mobility secondary to above. PTA, pt mod indep with SPC, has been working with [place] since recent admission. Educ on precautions, positioning, and importance of mobility. Today, pt able to transfer and ambulate with RW and intermittent min guard; good awareness to decrease WB thru RLE, but having increased difficulty only using LUE. Reports having necessary assist at home. Pt would benefit from continued acute PT services to maximize functional mobility and independence prior to d/c with continued HHPT services.     Follow Up Recommendations Home health PT;Supervision for mobility/OOB    Equipment Recommendations  None recommended by PT    Recommendations for Other Services       Precautions / Restrictions Precautions Precautions: Fall;Other (comment) Precaution Comments: RUE - pacemaker extraction, Pocket Pall II (compression for pacemaker site hematoma) Restrictions Other Position/Activity Restrictions: Minimal WB thru RUE to decrease bleeding risk      Mobility  Bed Mobility Overal bed mobility: Modified Independent             General bed mobility comments: HOB elevated, increased time and effort, good awareness to decrease WB thru RUE  Transfers Overall transfer level: Needs assistance Equipment used: Rolling walker (2 wheeled) Transfers: Sit to/from Stand Sit to Stand: Min guard         General transfer comment: Increased time and effort, reliant on momentum and LUE to power into standing without use of RUE; min guard for  safety  Ambulation/Gait Ambulation/Gait assistance: Supervision Gait Distance (Feet): 20 Feet Assistive device: Rolling walker (2 wheeled) Gait Pattern/deviations: Step-through pattern;Decreased stride length;Trunk flexed;Antalgic Gait velocity: Decreased Gait velocity interpretation: <1.8 ft/sec, indicate of risk for recurrent falls General Gait Details: Slow, guarded gait with RW, limited by fatigue and fearful of inicision site bleeding again; supervision for safety/assist for lines  Stairs            Wheelchair Mobility    Modified Rankin (Stroke Patients Only)       Balance Overall balance assessment: Needs assistance Sitting-balance support: No upper extremity supported;Feet supported Sitting balance-Leahy Scale: Fair       Standing balance-Leahy Scale: Fair Standing balance comment: Can static stand without UE support, dynamic stability improved with UE support                             Pertinent Vitals/Pain Pain Assessment: Faces Faces Pain Scale: Hurts a little bit Pain Location: R upper chest surgical site Pain Descriptors / Indicators: Discomfort Pain Intervention(s): Monitored during session    Home Living Family/patient expects to be discharged to:: Private residence Living Arrangements: Spouse/significant other Available Help at Discharge: Family;Available 24 hours/day Type of Home: Apartment Home Access: Level entry     Home Layout: One level Home Equipment: Cane - single point;Walker - 2 wheels;Toilet riser;Shower seat      Prior Function Level of Independence: Independent with assistive device(s)         Comments: Mod indep with SPC. Working with HHPT     Hand Dominance  Extremity/Trunk Assessment   Upper Extremity Assessment Upper Extremity Assessment: Generalized weakness    Lower Extremity Assessment Lower Extremity Assessment: Generalized weakness    Cervical / Trunk Assessment Cervical / Trunk  Assessment: Kyphotic  Communication   Communication: No difficulties  Cognition Arousal/Alertness: Awake/alert Behavior During Therapy: WFL for tasks assessed/performed Overall Cognitive Status: Within Functional Limits for tasks assessed                                 General Comments: WFL for simple tasks, not formally assessed      General Comments      Exercises     Assessment/Plan    PT Assessment Patient needs continued PT services  PT Problem List Decreased strength;Decreased activity tolerance;Decreased balance;Decreased mobility;Decreased knowledge of precautions       PT Treatment Interventions DME instruction;Gait training;Functional mobility training;Therapeutic activities;Therapeutic exercise;Balance training;Patient/family education    PT Goals (Current goals can be found in the Care Plan section)  Acute Rehab PT Goals Patient Stated Goal: No more bleeding PT Goal Formulation: With patient Time For Goal Achievement: 12/02/19 Potential to Achieve Goals: Good    Frequency Min 3X/week   Barriers to discharge        Co-evaluation               AM-PAC PT "6 Clicks" Mobility  Outcome Measure Help needed turning from your back to your side while in a flat bed without using bedrails?: None Help needed moving from lying on your back to sitting on the side of a flat bed without using bedrails?: None Help needed moving to and from a bed to a chair (including a wheelchair)?: A Little Help needed standing up from a chair using your arms (e.g., wheelchair or bedside chair)?: A Little Help needed to walk in hospital room?: A Little Help needed climbing 3-5 steps with a railing? : A Little 6 Click Score: 20    End of Session   Activity Tolerance: Patient tolerated treatment well;Patient limited by fatigue Patient left: with call bell/phone within reach(in bathroom with RN present to assist back to bed and hang blood) Nurse Communication:  Mobility status PT Visit Diagnosis: Other abnormalities of gait and mobility (R26.89);Muscle weakness (generalized) (M62.81)    Time: [id number] PT Time Calculation (min) (ACUTE ONLY): 20 min   Charges:   PT Evaluation $PT Eval Moderate Complexity: 1 Mod     Mabeline Caras, PT, DPT Acute Rehabilitation Services  Pager [phone] Office [place] 11/18/2019, 10:24 AM

## 2019-11-19 LAB — BPAM FFP
Blood Product Expiration Date: 202011292359
ISSUE DATE / TIME: 202011251005
Unit Type and Rh: 6200

## 2019-11-19 LAB — PREPARE FRESH FROZEN PLASMA: Unit division: 0

## 2019-11-19 LAB — HEMOGLOBIN AND HEMATOCRIT, BLOOD
HCT: 30.3 % — ABNORMAL LOW (ref 39.0–52.0)
Hemoglobin: 9.5 g/dL — ABNORMAL LOW (ref 13.0–17.0)

## 2019-11-19 NOTE — Progress Notes (Signed)
Assessed ICD extraction site during hand-off at approx 740. Observed bottom 1/3 of non adherent dressing on anterior side saturated with sangineous drainage and posterior of non-adherent dressing saturated with sangineous drainage. Observed four 4x4 dressings with moderate amount of drainage.  Changed dressing at 1020; applied four 4x4 dressings. prior 4x4 gauze moderate amount of sangineous drainage. Tender at site. Non adherent dressing at site still 1/3 staturated at bottom on anterior and saturated posterior.   Patient reported blood around penis for past 3 days, has not had previously. Inquired if MD aware, stated forgot to inform during rounds. No blood observed in urinal when urinal emptied. Paged cardio master at [phone].  FYI pt. has small amount of blood coming from penis over last 3 days.RN observed small/min. amount.no pain reported  During toileting observed small     amount old blood in briefs, patient stated had bleeding with prior surgery some time ago when they inserted a tube (foley cath).    Purple/white capsule found in bed during linen change at 1400; wasted in stericycle bin in med room   1400 changed chest dressing; prior four 4x4 moderate amount of sangineous drainage and prior non-adherent dressing still 1/3 saturated on anterior and completely on posterior. Removed non adherent dressing and applied four 4x4 gauze dressing with surgical tape.   Dressing right upper chest reassessed at 1824; mild sangineous drainage.

## 2019-11-19 NOTE — Progress Notes (Signed)
Progress Note  Patient Name: [facility]. Date of Encounter: 11/19/2019  Primary Cardiologist: No primary care provider on file.   Subjective   No chest pain or sob. No bleeding  Inpatient Medications    Scheduled Meds: . sodium chloride   Intravenous Once  . sodium chloride   Intravenous Once  . amiodarone  200 mg Oral Daily  . carvedilol  3.125 mg Oral BID WC  . Chlorhexidine Gluconate Cloth  6 each Topical Daily  . divalproex  500 mg Oral QHS  . DULoxetine  30 mg Oral Daily  . feeding supplement (ENSURE ENLIVE)  237 mL Oral TID BM  . ferrous sulfate  325 mg Oral BID WC  . influenza vaccine adjuvanted  0.5 mL Intramuscular Tomorrow-1000  . levETIRAcetam  1,000 mg Oral BID  . phenytoin  100 mg Oral BID  . simvastatin  20 mg Oral q1800  . sodium chloride flush  3 mL Intravenous Q12H  . tamsulosin  0.4 mg Oral Daily   Continuous Infusions: . sodium chloride Stopped (11/17/19 2053)  . sodium chloride 1,000 mL (11/18/19 0534)  . sodium chloride Stopped (11/17/19 2053)   PRN Meds: sodium chloride, sodium chloride, Place/Maintain arterial line **AND** sodium chloride, acetaminophen, HYDROcodone-acetaminophen, ondansetron (ZOFRAN) IV, polyethylene glycol, sodium chloride flush, traMADol   Vital Signs    Vitals:   11/18/19 1306 11/18/19 2111 11/19/19 0349 11/19/19 0352  BP: 117/74 114/74  115/86  Pulse: 89 87  84  Resp: 19 19  18   Temp: 98.3 F (36.8 C) 98.6 F (37 C)  99 F (37.2 C)  TempSrc: Oral Oral  Oral  SpO2: 100% 100%  100%  Weight:   86.3 kg   Height:        Intake/Output Summary (Last 24 hours) at 11/19/2019 0934 Last data filed at 11/19/2019 0900 Gross per 24 hour  Intake 1468 ml  Output 875 ml  Net 593 ml   Filed Weights   11/17/19 0440 11/18/19 0539 11/19/19 0349  Weight: 85 kg 85.8 kg 86.3 kg    Telemetry    nsr - Personally Reviewed  ECG    none - Personally Reviewed  Physical Exam   GEN: No acute distress.   Neck: No JVD  Cardiac: RRR, no murmurs, rubs, or gallops.  Respiratory: Clear to auscultation bilaterally. GI: Soft, nontender, non-distended  MS: No edema; No deformity. Neuro:  Nonfocal  Psych: Normal affect   Labs    Chemistry Recent Labs  Lab 11/16/19 0651 11/17/19 0409 11/18/19 0423  NA 133* 135 133*  K 3.7 3.9 3.9  CL 100 101 101  CO2 26 26 26   GLUCOSE 95 82 82  BUN 14 16 11   CREATININE 0.88 1.12 0.77  CALCIUM 8.4* 8.6* 9.0  GFRNONAA >60 >60 >60  GFRAA >60 >60 >60  ANIONGAP 7 8 6      Hematology Recent Labs  Lab 11/16/19 0651 11/17/19 0409 11/18/19 0423 11/19/19 0232  WBC 5.8 5.9 5.6  --   RBC 3.23* 3.28* 3.53*  --   HGB 9.4* 9.6* 10.3* 9.5*  HCT 29.9* 29.7* 32.3* 30.3*  MCV 92.6 90.5 91.5  --   MCH 29.1 29.3 29.2  --   MCHC 31.4 32.3 31.9  --   RDW 16.7* 16.8* 16.5*  --   PLT 257 275 265  --     Cardiac EnzymesNo results for input(s): TROPONINI in the last 168 hours. No results for input(s): TROPIPOC in the last 168 hours.  BNPNo results for input(s): BNP, PROBNP in the last 168 hours.   DDimer No results for input(s): DDIMER in the last 168 hours.   Radiology    No results found.  Cardiac Studies   none  Patient Profile     73 y.o. male admitted with draining ICD pocket, s/p extraction with ongoing bleeding requiring FFP due to undiagnosed coagulapathy.  Assessment & Plan    1. Draining/bleeding ICD pocket - he has been trasfused FFP. He has no active bleeding. If he is quite overnight from a bleeding perspective, we will discharge home tomorrow. 2. Coagulopathy - he will be referred to [facility] at discharge for additional workup. 3. Chronic systolic heart failure - his symptoms are class two. He will continue his current meds. 4. ICD - in light of the device being primary prevention and because of his bleeding diathesis, he will not be a candidate for repeat ICD insertion at this time.  Gregg Taylor,M.D. For questions or updates, please contact [place] Please consult [email or web address] for contact info under Cardiology/STEMI.      Signed, [name], MD  11/19/2019, 9:34 AM  Patient ID: Louis Ford., male   DOB: 1946-12-13, 73 y.o.   MRN: [id number]

## 2019-11-20 ENCOUNTER — Other Ambulatory Visit: Payer: Self-pay | Admitting: Cardiology

## 2019-11-20 DIAGNOSIS — D689 Coagulation defect, unspecified: Secondary | ICD-10-CM

## 2019-11-20 MED ORDER — POLYETHYLENE GLYCOL 3350 17 G PO PACK: 17.0000 g | PACK | Freq: Every day | ORAL | 0 refills | Status: DC | PRN

## 2019-11-20 MED ORDER — ENSURE ENLIVE PO LIQD: 237.0000 mL | Freq: Three times a day (TID) | ORAL | 12 refills | Status: DC

## 2019-11-20 NOTE — Progress Notes (Signed)
PT Cancellation Note  Patient Details Name: [facility]. MRN: [id number] DOB: [birth year]   Cancelled Treatment:    Reason Eval/Treat Not Completed: Patient declined, no reason specified Pt adamantly declined PT despite education and encouragement.  He stated he is leaving today and does not want to do anything.     Louis Ford 11/20/2019, 11:59 AM

## 2019-11-20 NOTE — TOC Transition Note (Signed)
Transition of Care [facility]) - CM/SW Discharge Note Marvetta Gibbons RN, BSN Transitions of Care Unit 4E- RN Case Manager- ([place]) [phone]   Patient Details  Name: [facility]. MRN: [id number] Date of Birth: 1946/12/14  Transition of Care [facility]) CM/SW Contact:  Dawayne Patricia, RN Phone Number: 11/20/2019, 12:33 PM   Clinical Narrative:    Pt stable for transition home today- active with [facility]- call made to cards NP to request [facility] orders for resumption of services- Orders placed for HHPT/OT/RN- call made to [place] with [facility] to notify of resumption of [place] services.    Final next level of care: [place] Barriers to Discharge: No Barriers Identified   Patient Goals and CMS Choice Patient states their goals for this hospitalization and ongoing recovery are:: get better CMS Medicare.gov Compare Post Acute Care list provided to:: Patient Represenative (must comment)(wife) Choice offered to / list presented to : Spouse  Discharge Placement               Home with [facility]        Discharge Plan and Services In-house Referral: NA Discharge Planning Services: CM Consult Post Acute Care Choice: Home Health          DME Arranged: (NA)         HH Arranged: RN, PT, OT [place] Agency: [facility] (formerly [employer]) Date [place]: 11/20/19 Time [place]: 24 Representative spoke with at [place]: [place] ([place]) Interventions     Readmission Risk Interventions Readmission Risk Prevention Plan 11/20/2019 08/07/2019  Transportation Screening Complete Complete  PCP or Specialist Appt within 5-7 Days Complete -  PCP or Specialist Appt within 3-5 Days - Complete  Home Care Screening Complete -  Medication Review (RN CM) Complete -  HRI or [place] - Not Complete  HRI or Home Care Consult comments - To SNF  Social Work Consult for [place] Planning/Counseling - Not  Complete  SW consult not completed comments - NA  Palliative Care Screening - Not Applicable  Medication Review Press photographer) - Complete  Some recent data might be hidden

## 2019-11-20 NOTE — Progress Notes (Signed)
Call placed to CCMD to notify of telemetry monitoring d/c.   

## 2019-11-20 NOTE — Progress Notes (Signed)
Upon assessment of pt last night, there was some sanguineous drainage on dressing under pressure device. Dressing was changed and pressure device continued.   No new visible drainage on dressing this morning.

## 2019-11-20 NOTE — Discharge Summary (Addendum)
Discharge Summary    Patient ID: [facility]. MRN: [id number]; DOB: January 17, 1946  Admit date: 11/09/2019 Discharge date: 11/20/2019  Primary Care Provider: [name], MD  Primary Cardiologist: No primary care provider on file.  Primary Electrophysiologist:  [name], MD  Advanced Heart Failure: Glori Bickers, MD  Discharge Diagnoses    Active Problems:   ICD (implantable cardioverter-defibrillator) pocket hematoma, subsequent encounter    Diagnostic Studies/Procedures    Intraoperative TEE 11/13/2019 FINDINGS:  LEFT VENTRICLE: EF = 40-45%. No regional wall motion abnormalities.  RIGHT VENTRICLE: Normal size and function. Single chamber ICD wire. There is a nodular density at the insertion of the ICD wire and RV. This appears to be part of the RV trabeculae and NOT a vegetation   LEFT ATRIUM: Markedly dilated  LEFT ATRIAL APPENDAGE: No thrombus.   RIGHT ATRIUM: Markedly dilated  AORTIC VALVE:  Trileaflet. Trivial AI. No vegetation.   MITRAL VALVE:    Normal. Mild MR. No vegetation.   TRICUSPID VALVE: Normal. Moderate TR. No vegetation.   PULMONIC VALVE: Grossly normal. No vegetation. Trivial PR  INTERATRIAL SEPTUM: No PFO or ASD.  PERICARDIUM: No effusion  DESCENDING AORTA: Moderate plaque   CONCLUSION:  No convincing evidence of endocarditis. As noted above, there is a nodular density at the insertion of the ICD wire and RV. This appears to be part of the RV trabeculae and NOT a vegetation   Daniel [name],MD 1:36 PM  _____________  ICD Lead Extraction 11/13/2019 Procedure performed: Extraction of a single-chamber ICD  Preoperative diagnosis: ICD pocket infection  Postoperative diagnosis: Same as preoperative diagnosis  Description of the procedure: After informed consent was obtained, the patient was taken to the operating room in the fasting state.  Arterial hemodynamic monitoring was carried out by the  anesthesia service as well as general endotracheal anesthesia.  A TEE probe was placed to monitor his pericardium for bleeding.  A 6 French sheath was inserted percutaneously in the right femoral vein and advanced to the iliac vein.  The attention was then turned to removal of the patient's ICD.  A 5 cm incision was carried out.  Electrocautery was utilized to dissect down to the pocket.  There was copious amounts of bloody fluid along with some clotted blood along with some pockets of purulent fluid.  The pocket was irrigated.  The generator was removed with gentle traction.  The lead was freed up from its dense fibrous scar tissue with electrocautery.  There was extensive bleeding.  Additional electrocautery was utilized to dissect down to the patient's ICD lead sewing sleeve and it was retracted.  The lead was disconnected from the generator.  A stylette was inserted into the body of the lead and the helix retracted.  The stylette was removed and the lead was cut.  A [employer] Z locking stylette was inserted into the body of the lead.  The [employer] 67 [place] sub-C short dissection sheath was then advanced over the body of the lead into the subclavian vein.  Gentle traction was placed on the body of the lead and it was retracted from the right ventricle up through the right atrium and into the superior vena cava.  At the right subclavian vein SVC junction the lead became stuck and could not be retracted further.  Attempts to dissect through the subclavian vein failed as the lead went through the first rib and clavicle and the extraction sheath could not pass through these bony structures.  At this point  attention was turned to snaring of the lead.  A long 035 wire was inserted into the right femoral vein and advanced into the right atrium.  A 16 [employer] was advanced over the guidewire but could not pass through scar tissue at the end of the right femoral vein.  A dilator could be  advanced but the 16 French sheath would not pass.  A 10 French sheath was inserted.  The 6 French gooseneck snare was then inserted into the 10 French sheath and advanced under fluoroscopic guidance towards the right atrium.  Unfortunately the 10 French sheath came out of the venous system and attempts to advance it back into the venous system were quite difficult.  Pressure was held.  The gooseneck snare was then used to snare the defibrillator lead successfully.  The lead was cut.  The lead was pulled down into the femoral vein but could not be brought into the sheath and was stuck.  We pushed the snare and the lead back into the inferior vena cava and the snare was advanced past the distal coil and a second attempt to extract the lead was carried out this time successfully without any evidence of damage to the femoral vein.  Pressure was held for 30 minutes and hemostasis was obtained.  Attention was then turned to hemostasis of the right infraclavicular pocket.  This was quite difficult secondary to the patient's coagulopathy and extensive scar tissue.  Eventually some anticoagulant powder was inserted into the pocket and coagulation appeared to be present.  The incision was closed with multiple 2-0 Prolene suture.  A pressure dressing was applied.  A drain was placed.  The patient was returned to the recovery area in stable condition.  Complications: There were no immediate procedure complications  Conclusion: Successful but very difficult extraction of a 73-year-old ICD system in a patient with ICD pocket infection.  [name], MD   History of Present Illness     [facility]. is a 73 y.o. male with history of chronic systolic CHF, ICM, CAD, h/o spontaneous retroperitoneal bleed, ICD with history of appropriate therapies for VT, and h/o ICD complications including extraction of a left sided device due to "bleeding" per patient.  Pt had gen change 09/10/2019 due to device at [facility]  Course was  complicated by hematoma with oozing, requiring evacuation on 09/17/2019.  Pt had recurrent hematoma and has been managed in the office with pressure dressings since. Hematology was consulted at time of hematoma evacuation, but no recommendations other than meticulous bleeding control at the time of the procedure for preventing a second hematoma.    Pt presented to the office 11/05/2019 with recurrent oozing and aches around the area for the prior 3 days. Assessment of the area showed the swelling had mildly improved, but serosanguinous drainage was present on the dressing. There was a questionable foul smell initially, which was difficult to fully appreciate masked, and was not identified again. Due to high bed volumes in the hospital, discussion with EP MDs ended in recommendation of outpatient antibiotics, with strict return precautions and COVID testing with direct admission planned for 11/09/2019.   Hospital Course     Consultants: Hematology  The patient underwent device and lead extraction on 11/13/2019. He continued to have bleeding at the site and has been treated with PRBCs and multiple units of plasma and cryoprecipitate. He was also treated with IV Ancef.   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  Hematology was consulted.  Etiology of the bleeding disorder is not entirely clear.  His factor VII levels have been normal so likely not a factor VII deficiency.  Bleeding possibly caused by liver disease versus monoclonal paraprotein anemia.  Blood cultures were no growth. (Patient received Keflex prior to collection).  He had a drain in briefly and was taken out on 11/15/2019.  The patient has had intermittent oozing at the site, not bleeding today.  Dr. Lovena Le expects her to be some oozing and he discussed keeping the pressure device on his chest and applying pressure as needed with his wife.  He will be seen in the office in 3 days with plan to remove sutures at that  time.  In light of his device being primary prevention and because of his bleeding diathesis, he will not be a candidate for repeat ICD insertion at this time.  We will not place a LifeVest.  He is having NYHA class II heart failure symptoms, currently well compensated.  The patient has referred to [facility] for further work-up of coagulopathy.  The patient is not on any aspirin or anticoagulation.  Patient has been seen by Dr. Lovena Le today and deemed ready for discharge home. All follow up appointments have been scheduled. Discharge medications are listed below.   Did the patient have an acute coronary syndrome (MI, NSTEMI, STEMI, etc) this admission?:  No                               Did the patient have a percutaneous coronary intervention (stent / angioplasty)?:  No.   _____________  Discharge Vitals Blood pressure 120/90, pulse 87, temperature 97.8 F (36.6 C), temperature source Oral, resp. rate 20, height 5\' 11"  (1.803 m), weight 85 kg, SpO2 100 %.  Filed Weights   11/18/19 0539 11/19/19 0349 11/20/19 0530  Weight: 85.8 kg 86.3 kg 85 kg    Labs & Radiologic Studies    CBC Recent Labs    11/18/19 0423 11/19/19 0232  WBC 5.6  --   HGB 10.3* 9.5*  HCT 32.3* 30.3*  MCV 91.5  --   PLT 265  --    Basic Metabolic Panel Recent Labs    11/18/19 0423  NA 133*  K 3.9  CL 101  CO2 26  GLUCOSE 82  BUN 11  CREATININE 0.77  CALCIUM 9.0   Liver Function Tests No results for input(s): AST, ALT, ALKPHOS, BILITOT, PROT, ALBUMIN in the last 72 hours. No results for input(s): LIPASE, AMYLASE in the last 72 hours. High Sensitivity Troponin:   Recent Labs  Lab 11/10/19 0755  TROPONINIHS 6    BNP Invalid input(s): POCBNP D-Dimer No results for input(s): DDIMER in the last 72 hours. Hemoglobin A1C No results for input(s): HGBA1C in the last 72 hours. Fasting Lipid Panel No results for input(s): CHOL, HDL, LDLCALC, TRIG, CHOLHDL, LDLDIRECT in the last 72  hours. Thyroid Function Tests No results for input(s): TSH, T4TOTAL, T3FREE, THYROIDAB in the last 72 hours.  Invalid input(s): FREET3 _____________  Dg Chest Port 1 View  Result Date: 11/13/2019 CLINICAL DATA:  ICD pocket infection EXAM: PORTABLE CHEST 1 VIEW COMPARISON:  07/25/2019 FINDINGS: Interval removal of single lead right-sided implanted ICD. Extensive postsurgical changes to the sternum and mediastinum. Mild streaky left basilar atelectasis. Right lung is clear. No pleural effusion. No pneumothorax. IMPRESSION: 1. Interval removal of single lead implanted ICD. 2. Mild streaky  left basilar atelectasis. 3. No pneumothorax. Electronically Signed   By: Davina Poke M.D.   On: 11/13/2019 21:07   Ct Angio Chest Aorta W/cm &/or Wo/cm  Result Date: 10/29/2019 CLINICAL DATA:  73 year old male with left shoulder pain. Concern for aortic dissection. EXAM: CT ANGIOGRAPHY CHEST WITH CONTRAST TECHNIQUE: Multidetector CT imaging of the chest was performed using the standard protocol during bolus administration of intravenous contrast. Multiplanar CT image reconstructions and MIPs were obtained to evaluate the vascular anatomy. CONTRAST:  71mL ISOVUE-370 IOPAMIDOL (ISOVUE-370) INJECTION 76% COMPARISON:  Chest CT dated 04/21/2019 FINDINGS: Evaluation is limited due to streak artifact caused by patient's arms. Cardiovascular: Mild dilatation of the right atrium. There is multi vessel coronary vascular calcification and postsurgical changes of CABG. Left pericardial calcification likely sequela of prior infection/inflammation. Right-sided pacemaker device noted. There is mild atherosclerotic calcification of the thoracic aorta. No aneurysmal dilatation or dissection. Evaluation of the pulmonary arteries is limited due to suboptimal opacification and timing of the contrast. No large or central pulmonary artery embolus identified. Mediastinum/Nodes: No hilar or mediastinal adenopathy. The esophagus is grossly  unremarkable. No mediastinal fluid collection. Lungs/Pleura: Left lung base linear atelectasis/scarring. There is no focal consolidation, pleural effusion, or pneumothorax. The central airways are patent. Upper Abdomen: Multiple bilateral renal cystic lesions, suboptimally characterized. There is atrophy of the body and tail of the pancreas. This findings are similar to prior CT. The pancreatic mass is not excluded but cannot be evaluated on this chest CT. Dedicated CT of the abdomen pelvis without and with contrast is recommended for better evaluation. Musculoskeletal: Median sternotomy wires. Degenerative changes of the spine. No acute osseous pathology. Left pectoral intramuscular lipoma. Review of the MIP images confirms the above findings. IMPRESSION: 1. No acute intrathoracic pathology. No aortic dissection. CT evidence of central pulmonary artery embolus. 2. Aortic Atherosclerosis (ICD10-I70.0). 3. Atrophic appearance of the body and tail of the pancreas concerning for underlying pancreatic lesion. Dedicated CT of the abdomen pelvis recommended for better evaluation. Electronically Signed   By: Anner Crete M.D.   On: 10/29/2019 16:42   Hybrid Or Imaging (mc Only)  Result Date: 11/13/2019 There is no interpretation for this exam.  This order is for images obtained during a surgical procedure.  Please See "Surgeries" Tab for more information regarding the procedure.   Disposition   Pt is being discharged home today in good condition.  Follow-up Plans & Appointments    Follow-up Information    [name], MD Follow up.   Specialty: Cardiology Why: You will need to be seen in the office on Monday, 11/30, to check your surgical site. The office should call you, if they do not call by 9am, please call them.  You will also have blood checked on that day.  Contact information: [id number] [address] [phone]          Discharge Instructions     Diet - low sodium heart healthy   Complete by: As directed    Discharge instructions   Complete by: As directed    If there is bleeding or oozing from the site, hold pressure as taught by Dr. Lovena Le.   Increase activity slowly   Complete by: As directed       Discharge Medications   Allergies as of 11/20/2019   No Known Allergies     Medication List    STOP taking these medications   cephALEXin 500 MG capsule Commonly known as: Keflex  TAKE these medications   acetaminophen 325 MG tablet Commonly known as: TYLENOL Take 650 mg by mouth every 6 (six) hours as needed for mild pain.   amiodarone 200 MG tablet Commonly known as: PACERONE Take 1 tablet by mouth once daily   carvedilol 3.125 MG tablet Commonly known as: COREG Take 1 tablet (3.125 mg total) by mouth 2 (two) times daily with a meal.   divalproex 500 MG 24 hr tablet Commonly known as: DEPAKOTE ER Take 1 tablet every night What changed:   how much to take  how to take this  when to take this  additional instructions   DULoxetine 30 MG capsule Commonly known as: CYMBALTA Take 1 capsule by mouth once daily   feeding supplement (ENSURE ENLIVE) Liqd Take 237 mLs by mouth 3 (three) times daily between meals.   ferrous sulfate 325 (65 FE) MG tablet Take 1 tablet (325 mg total) by mouth 2 (two) times daily with a meal. What changed: when to take this   furosemide 40 MG tablet Commonly known as: LASIX Take 40 mg by mouth.   levETIRAcetam 1000 MG tablet Commonly known as: KEPPRA Take 1 tablet (1,000 mg total) by mouth 2 (two) times daily.   phenytoin 100 MG ER capsule Commonly known as: Dilantin Take 1 capsule (100 mg total) by mouth 2 (two) times daily.   polyethylene glycol 17 g packet Commonly known as: MIRALAX / GLYCOLAX Take 17 g by mouth daily as needed for mild constipation or moderate constipation. What changed: reasons to take this   simvastatin 20 MG tablet Commonly known as:  ZOCOR TAKE 1 TABLET BY MOUTH ONCE DAILY AT 6PM What changed:   how much to take  how to take this  when to take this  additional instructions   tamsulosin 0.4 MG Caps capsule Commonly known as: FLOMAX Take 1 capsule by mouth once daily   traMADol 50 MG tablet Commonly known as: ULTRAM Take 1 tablet (50 mg total) by mouth every 6 (six) hours as needed for moderate pain. for pain          Outstanding Labs/Studies   CBC with differential on Monday 11/30  Duration of Discharge Encounter   Greater than 30 minutes including physician time.  Signed, Daune Perch, NP 11/20/2019, 11:47 AM   EP Attending  Patient seen and examined. Agree with above. The patient has stopped bleeding (for now) and has no fever or evidence of active infection. He will be discharged home. I will see him back in the office in 3 days. I encouraged him to have his wife apply pressure if he starts to ooze which I expect him to do.   Mikle Bosworth.D.

## 2019-11-20 NOTE — Progress Notes (Signed)
Discharge education and medication education given to patient and spouse with teach back. Education on increasing activity slowly, low sodium diet, and bleeding at site given.  All questions and concerns answered. Peripheral IV and telemetry leads removed. All patient belongings given to patient. Patient transported to main entrance by nurse via wheelchair.

## 2019-11-20 NOTE — Progress Notes (Signed)
Progress Note  Patient Name: [facility]. Date of Encounter: 11/20/2019  Primary Cardiologist: No primary care provider on file.   Subjective   No chest pain. Note some slow bleeding overnight  Inpatient Medications    Scheduled Meds: . sodium chloride   Intravenous Once  . sodium chloride   Intravenous Once  . amiodarone  200 mg Oral Daily  . carvedilol  3.125 mg Oral BID WC  . Chlorhexidine Gluconate Cloth  6 each Topical Daily  . divalproex  500 mg Oral QHS  . DULoxetine  30 mg Oral Daily  . feeding supplement (ENSURE ENLIVE)  237 mL Oral TID BM  . ferrous sulfate  325 mg Oral BID WC  . influenza vaccine adjuvanted  0.5 mL Intramuscular Tomorrow-1000  . levETIRAcetam  1,000 mg Oral BID  . phenytoin  100 mg Oral BID  . simvastatin  20 mg Oral q1800  . sodium chloride flush  3 mL Intravenous Q12H  . tamsulosin  0.4 mg Oral Daily   Continuous Infusions: . sodium chloride Stopped (11/17/19 2053)  . sodium chloride 1,000 mL (11/18/19 0534)  . sodium chloride Stopped (11/17/19 2053)   PRN Meds: sodium chloride, sodium chloride, Place/Maintain arterial line **AND** sodium chloride, acetaminophen, HYDROcodone-acetaminophen, ondansetron (ZOFRAN) IV, polyethylene glycol, sodium chloride flush, traMADol   Vital Signs    Vitals:   11/19/19 1622 11/19/19 1956 11/20/19 0352 11/20/19 0530  BP: 124/89 117/79 125/89   Pulse: 91 84 89   Resp: 19 18 18    Temp: 97.8 F (36.6 C) 98.4 F (36.9 C) 98.2 F (36.8 C)   TempSrc: Oral Oral Oral   SpO2: 100% 100% 99%   Weight:    85 kg  Height:        Intake/Output Summary (Last 24 hours) at 11/20/2019 0812 Last data filed at 11/20/2019 0741 Gross per 24 hour  Intake 880 ml  Output 1875 ml  Net -995 ml   Filed Weights   11/18/19 0539 11/19/19 0349 11/20/19 0530  Weight: 85.8 kg 86.3 kg 85 kg    Telemetry    nsr with PVC's - Personally Reviewed  ECG    none - Personally Reviewed  Physical Exam   GEN: No acute  distress.   Neck: No JVD Cardiac: RRR, no murmurs, rubs, or gallops. No active bleeding Respiratory: Clear to auscultation bilaterally. GI: Soft, nontender, non-distended  MS: No edema; No deformity. Neuro:  Nonfocal  Psych: Normal affect   Labs    Chemistry Recent Labs  Lab 11/16/19 0651 11/17/19 0409 11/18/19 0423  NA 133* 135 133*  K 3.7 3.9 3.9  CL 100 101 101  CO2 26 26 26   GLUCOSE 95 82 82  BUN 14 16 11   CREATININE 0.88 1.12 0.77  CALCIUM 8.4* 8.6* 9.0  GFRNONAA >60 >60 >60  GFRAA >60 >60 >60  ANIONGAP 7 8 6      Hematology Recent Labs  Lab 11/16/19 0651 11/17/19 0409 11/18/19 0423 11/19/19 0232  WBC 5.8 5.9 5.6  --   RBC 3.23* 3.28* 3.53*  --   HGB 9.4* 9.6* 10.3* 9.5*  HCT 29.9* 29.7* 32.3* 30.3*  MCV 92.6 90.5 91.5  --   MCH 29.1 29.3 29.2  --   MCHC 31.4 32.3 31.9  --   RDW 16.7* 16.8* 16.5*  --   PLT 257 275 265  --     Cardiac EnzymesNo results for input(s): TROPONINI in the last 168 hours. No results for input(s): TROPIPOC in  the last 168 hours.   BNPNo results for input(s): BNP, PROBNP in the last 168 hours.   DDimer No results for input(s): DDIMER in the last 168 hours.   Radiology    No results found.  Cardiac Studies   none  Patient Profile     73 y.o. male admitted with ICD site bleeding/infection, s/p extraction with residual post op bleeding due to his coagulopathy  Assessment & Plan    1. Post op bleeding - he is not bleeding today. I exepect him to have some oozing and I have discussed keeping the pressure device on his chest and applying pressure as needed. He will come back to our office in 3 days. Plan to remove sutures at that time. 2. Chronic systolic heart failure - his symptoms are class 2 and he is well compensated. 3. ICD - with his coagulopathy, and device indication for primary prevention, the risk/benefit of another device is against another device insertion. We will not place a Life Vest.  Gregg Taylor,M.D.   For questions or updates, please contact [facility] Please consult [email or web address] for contact info under Cardiology/STEMI.      Signed, Louis Peru, MD  11/20/2019, 8:12 AM  Patient ID: Louis Ford., male   DOB: 21-Apr-1946, 73 y.o.   MRN: [id number]

## 2019-11-24 ENCOUNTER — Telehealth: Payer: Self-pay | Admitting: Internal Medicine

## 2019-11-24 NOTE — Telephone Encounter (Signed)
Returned call to [facility].  Advised Pt's home health orders should be signed by PCP or previous ordering prior to recent hospitalization.  Resumption of care orders only.

## 2019-11-24 NOTE — Telephone Encounter (Signed)
New Message   Per [facility] there is a delay in the home health start of services and need for it to start on 11/25/19. Per [facility] need a verbal order.Please call to discuss.

## 2019-11-25 ENCOUNTER — Other Ambulatory Visit: Payer: Self-pay

## 2019-11-25 ENCOUNTER — Ambulatory Visit (INDEPENDENT_AMBULATORY_CARE_PROVIDER_SITE_OTHER): Payer: Medicare Other | Admitting: *Deleted

## 2019-11-25 DIAGNOSIS — I2510 Atherosclerotic heart disease of native coronary artery without angina pectoris: Secondary | ICD-10-CM | POA: Diagnosis not present

## 2019-11-25 DIAGNOSIS — G4700 Insomnia, unspecified: Secondary | ICD-10-CM | POA: Diagnosis not present

## 2019-11-25 DIAGNOSIS — I255 Ischemic cardiomyopathy: Secondary | ICD-10-CM | POA: Diagnosis not present

## 2019-11-25 DIAGNOSIS — I5022 Chronic systolic (congestive) heart failure: Secondary | ICD-10-CM | POA: Diagnosis not present

## 2019-11-25 DIAGNOSIS — M1712 Unilateral primary osteoarthritis, left knee: Secondary | ICD-10-CM | POA: Diagnosis not present

## 2019-11-25 DIAGNOSIS — D631 Anemia in chronic kidney disease: Secondary | ICD-10-CM | POA: Diagnosis not present

## 2019-11-25 DIAGNOSIS — Z8673 Personal history of transient ischemic attack (TIA), and cerebral infarction without residual deficits: Secondary | ICD-10-CM | POA: Diagnosis not present

## 2019-11-25 DIAGNOSIS — I130 Hypertensive heart and chronic kidney disease with heart failure and stage 1 through stage 4 chronic kidney disease, or unspecified chronic kidney disease: Secondary | ICD-10-CM | POA: Diagnosis not present

## 2019-11-25 DIAGNOSIS — Z48812 Encounter for surgical aftercare following surgery on the circulatory system: Secondary | ICD-10-CM | POA: Diagnosis not present

## 2019-11-25 DIAGNOSIS — R296 Repeated falls: Secondary | ICD-10-CM | POA: Diagnosis not present

## 2019-11-25 DIAGNOSIS — E785 Hyperlipidemia, unspecified: Secondary | ICD-10-CM | POA: Diagnosis not present

## 2019-11-25 DIAGNOSIS — Z4801 Encounter for change or removal of surgical wound dressing: Secondary | ICD-10-CM | POA: Diagnosis not present

## 2019-11-25 DIAGNOSIS — M6281 Muscle weakness (generalized): Secondary | ICD-10-CM | POA: Diagnosis not present

## 2019-11-25 DIAGNOSIS — D689 Coagulation defect, unspecified: Secondary | ICD-10-CM | POA: Diagnosis not present

## 2019-11-25 DIAGNOSIS — M109 Gout, unspecified: Secondary | ICD-10-CM | POA: Diagnosis not present

## 2019-11-25 DIAGNOSIS — N183 Chronic kidney disease, stage 3 unspecified: Secondary | ICD-10-CM | POA: Diagnosis not present

## 2019-11-25 DIAGNOSIS — I4891 Unspecified atrial fibrillation: Secondary | ICD-10-CM | POA: Diagnosis not present

## 2019-11-25 LAB — CBC WITH DIFFERENTIAL/PLATELET
Basophils Absolute: 0 10*3/uL (ref 0.0–0.2)
Basos: 0 %
EOS (ABSOLUTE): 0.2 10*3/uL (ref 0.0–0.4)
Eos: 6 %
Hematocrit: 33.2 % — ABNORMAL LOW (ref 37.5–51.0)
Hemoglobin: 11.2 g/dL — ABNORMAL LOW (ref 13.0–17.7)
Lymphocytes Absolute: 0.7 10*3/uL (ref 0.7–3.1)
Lymphs: 21 %
MCH: 29.6 pg (ref 26.6–33.0)
MCHC: 33.7 g/dL (ref 31.5–35.7)
MCV: 88 fL (ref 79–97)
Monocytes Absolute: 0.5 10*3/uL (ref 0.1–0.9)
Monocytes: 14 %
Neutrophils Absolute: 2 10*3/uL (ref 1.4–7.0)
Neutrophils: 59 %
Platelets: 232 10*3/uL (ref 150–450)
RBC: 3.79 x10E6/uL — ABNORMAL LOW (ref 4.14–5.80)
RDW: 16.1 % — ABNORMAL HIGH (ref 11.6–15.4)
WBC: 3.5 10*3/uL (ref 3.4–10.8)

## 2019-11-25 NOTE — Patient Instructions (Signed)
Follow up with Dr Lovena Le in the office at 12:15 pm tomorrow 11/26/19. Leave pressure dressing in place until visit tomorrow.

## 2019-11-26 ENCOUNTER — Encounter: Payer: Self-pay | Admitting: Internal Medicine

## 2019-11-26 ENCOUNTER — Ambulatory Visit (INDEPENDENT_AMBULATORY_CARE_PROVIDER_SITE_OTHER): Payer: Medicare Other | Admitting: Internal Medicine

## 2019-11-26 VITALS — BP 102/68 | HR 90 | Ht 71.0 in | Wt 186.4 lb

## 2019-11-26 DIAGNOSIS — Z4801 Encounter for change or removal of surgical wound dressing: Secondary | ICD-10-CM | POA: Diagnosis not present

## 2019-11-26 DIAGNOSIS — T148XXA Other injury of unspecified body region, initial encounter: Secondary | ICD-10-CM | POA: Diagnosis not present

## 2019-11-26 DIAGNOSIS — Z8673 Personal history of transient ischemic attack (TIA), and cerebral infarction without residual deficits: Secondary | ICD-10-CM | POA: Diagnosis not present

## 2019-11-26 DIAGNOSIS — D689 Coagulation defect, unspecified: Secondary | ICD-10-CM | POA: Diagnosis not present

## 2019-11-26 DIAGNOSIS — G4700 Insomnia, unspecified: Secondary | ICD-10-CM | POA: Diagnosis not present

## 2019-11-26 DIAGNOSIS — I130 Hypertensive heart and chronic kidney disease with heart failure and stage 1 through stage 4 chronic kidney disease, or unspecified chronic kidney disease: Secondary | ICD-10-CM | POA: Diagnosis not present

## 2019-11-26 DIAGNOSIS — M109 Gout, unspecified: Secondary | ICD-10-CM | POA: Diagnosis not present

## 2019-11-26 DIAGNOSIS — M6281 Muscle weakness (generalized): Secondary | ICD-10-CM | POA: Diagnosis not present

## 2019-11-26 DIAGNOSIS — I5022 Chronic systolic (congestive) heart failure: Secondary | ICD-10-CM | POA: Diagnosis not present

## 2019-11-26 DIAGNOSIS — R296 Repeated falls: Secondary | ICD-10-CM | POA: Diagnosis not present

## 2019-11-26 DIAGNOSIS — Z48812 Encounter for surgical aftercare following surgery on the circulatory system: Secondary | ICD-10-CM | POA: Diagnosis not present

## 2019-11-26 DIAGNOSIS — N183 Chronic kidney disease, stage 3 unspecified: Secondary | ICD-10-CM | POA: Diagnosis not present

## 2019-11-26 DIAGNOSIS — I255 Ischemic cardiomyopathy: Secondary | ICD-10-CM | POA: Diagnosis not present

## 2019-11-26 DIAGNOSIS — I428 Other cardiomyopathies: Secondary | ICD-10-CM

## 2019-11-26 DIAGNOSIS — I2510 Atherosclerotic heart disease of native coronary artery without angina pectoris: Secondary | ICD-10-CM | POA: Diagnosis not present

## 2019-11-26 DIAGNOSIS — M1712 Unilateral primary osteoarthritis, left knee: Secondary | ICD-10-CM | POA: Diagnosis not present

## 2019-11-26 DIAGNOSIS — D631 Anemia in chronic kidney disease: Secondary | ICD-10-CM | POA: Diagnosis not present

## 2019-11-26 DIAGNOSIS — I4891 Unspecified atrial fibrillation: Secondary | ICD-10-CM | POA: Diagnosis not present

## 2019-11-26 DIAGNOSIS — E785 Hyperlipidemia, unspecified: Secondary | ICD-10-CM | POA: Diagnosis not present

## 2019-11-26 NOTE — Progress Notes (Signed)
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 The patient was hospitalized for a week after extraction as he would not stop bleeding, requiring several units of FFP and PRBC's. He was discharged home. He has worn a pressure dressing. He has had only minimal bleeding. He denies fever or chills. His wound is sore. He was not thought to be a candidate for a subcutaneous device.   No Known Allergies   Current Outpatient Medications  Medication Sig Dispense Refill  . acetaminophen (TYLENOL) 325 MG tablet Take 650 mg by mouth every 6 (six) hours as needed for mild pain.    [name] amiodarone (PACERONE) 200 MG tablet Take 1 tablet by mouth once daily (Patient taking differently: Take 200 mg by mouth daily. ) 30 tablet 0  . divalproex (DEPAKOTE ER) 500 MG 24 hr tablet Take 1 tablet every night (Patient taking differently: Take 500 mg by mouth at bedtime. ) 30 tablet 11  . DULoxetine (CYMBALTA) 30 MG capsule Take 1 capsule by mouth once daily 90 capsule 1  . feeding supplement, ENSURE ENLIVE, (ENSURE ENLIVE) LIQD Take 237 mLs by mouth 3 (three) times daily between meals. 237 mL 12  . furosemide (LASIX) 40 MG tablet Take 40 mg by mouth.    . levETIRAcetam (KEPPRA) 1000 MG tablet Take 1 tablet (1,000 mg total) by mouth 2 (two) times daily. 60 tablet 11  . phenytoin (DILANTIN) 100 MG ER capsule Take 1 capsule (100 mg total) by mouth 2 (two) times daily. 60 capsule 11  . polyethylene glycol (MIRALAX / GLYCOLAX) 17 g packet Take 17 g by mouth daily as needed for mild constipation or moderate constipation. 14 each 0  . simvastatin (ZOCOR) 20 MG tablet TAKE 1 TABLET BY MOUTH ONCE DAILY AT 6PM (Patient taking differently: Take 20 mg by mouth daily at 6 PM. ) 90 tablet 1  . tamsulosin (FLOMAX) 0.4 MG CAPS capsule Take 1 capsule by mouth once daily (Patient taking differently: Take 0.4 mg by mouth daily. ) 90 capsule 0  . traMADol (ULTRAM) 50 MG tablet Take 1 tablet  (50 mg total) by mouth every 6 (six) hours as needed for moderate pain. for pain 20 tablet 0  . carvedilol (COREG) 3.125 MG tablet Take 1 tablet (3.125 mg total) by mouth 2 (two) times daily with a meal. 60 tablet 1  . ferrous sulfate 325 (65 FE) MG tablet Take 1 tablet (325 mg total) by mouth 2 (two) times daily with a meal. (Patient taking differently: Take 325 mg by mouth daily with breakfast. ) 60 tablet 0   No current facility-administered medications for this visit.      Past Medical History:  Diagnosis Date  . AICD (automatic cardioverter/defibrillator) present   . Atrial fibrillation   09/22/2012  . Blood loss anemia 04/18/2017   After GI bleed from colonoscopy and polypectomy  . CAD (coronary artery disease)   . Chronic systolic heart failure ([place])   . 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 GIB (gastrointestinal bleeding) 02/20/2017  . History of MRSA infection 05/2011  . HTN (hypertension)   . Hx of adenomatous colonic polyps 02/20/2017   01/2017 - 3 cm sigmoid TV adenoma and other smaller polyps - had post-polypectomy bleed Tx w/ clips Consider repeat colonoscopy 3 yrs [name], MD, [name]   . Hyperlipidemia   . implantable cardiac defibrillator-Biotronik    Device Implanted  2006; s/p gen change 03/2011 : bleeding persistent with pocket erosion and infection; explant and reimplant  06/2011  . Ischemic cardiomyopathy    EF 15 to 20% by TTE and TEE in 09/2012.  Severe LV dysfunction  . Obesity    BMI 31 in 09/2012  . Persistent atrial fibrillation with rapid ventricular response ([place]) 04/21/2014  . Retroperitoneal bleed 04/21/2019  . Seizure disorder ([place]) latest 09/30/2012  . Seizures ([place])   . Stroke ([place])     ROS:   All systems reviewed and negative except as noted in the HPI.   Past Surgical History:  Procedure Laterality Date  . ALVEOLOPLASTY N/A 07/26/2019   Procedure: ALVEOLOPLASTY WITH RESUTURING OF ORAL WOUND;   Surgeon: [name], [name];  Location: [facility];  Service: Oral Surgery;  Laterality: N/A;  . CARDIAC CATHETERIZATION N/A 10/12/2015   Procedure: Right Heart Cath;  Surgeon: [name], MD;  Location: [place] [facility];  Service: Cardiovascular;  Laterality: N/A;  . CARDIOVERSION  09/24/2012   Procedure: CARDIOVERSION;  Surgeon: [name], MD;  Location: [facility] ENDOSCOPY;  Service: Cardiovascular;  Laterality: N/A;  . CARDIOVERSION N/A 12/25/2017   Procedure: CARDIOVERSION;  Surgeon: [name], MD;  Location: [facility];  Service: Cardiovascular;  Laterality: N/A;  . COLONOSCOPY W/ POLYPECTOMY  02/13/2017  . CORONARY ARTERY BYPASS GRAFT  2011   in [place] N/A 08/12/2019   Procedure: CAUTERIZATION OF ORAL BLEEDING;  Surgeon: [name], [name];  Location: [facility];  Service: Oral Surgery;  Laterality: N/A;  . FLEXIBLE SIGMOIDOSCOPY N/A 02/20/2017   Procedure: FLEXIBLE SIGMOIDOSCOPY;  Surgeon: [name], MD;  Location: [facility];  Service: Endoscopy;  Laterality: N/A;  . FLEXIBLE SIGMOIDOSCOPY N/A 02/21/2017   Procedure: FLEXIBLE SIGMOIDOSCOPY;  Surgeon: [name], MD;  Location: [facility] ENDOSCOPY;  Service: Endoscopy;  Laterality: N/A;  . ICD    . ICD GENERATOR CHANGEOUT N/A 09/10/2019   Procedure: [place];  Surgeon: [name], MD;  Location: [place] [facility];  Service: Cardiovascular;  Laterality: N/A;  . ICD LEAD REMOVAL Right 11/13/2019   Procedure: ICD EXTRACTION and Lead Extraction;  Surgeon: [name], MD;  Location: [place];  Service: Cardiovascular;  Laterality: Right;  Dr. Servando Snare for backup  . POCKET REVISION/RELOCATION N/A 09/17/2019   Procedure: POCKET REVISION/RELOCATION;  Surgeon: Thompson Grayer, MD;  Location: [place] [facility];  Service: Cardiovascular;  Laterality: N/A;  . SUBMANDIBULAR GLAND EXCISION N/A 08/08/2019   Procedure: suture of oral wounds;  Surgeon: [name], [name];  Location: [facility];  Service: Oral Surgery;   Laterality: N/A;  . TEE WITHOUT CARDIOVERSION  09/24/2012   Procedure: TRANSESOPHAGEAL ECHOCARDIOGRAM (TEE);  Surgeon: [name], MD;  Location: [place];  Service: Cardiovascular;  Laterality: N/A;  dave/anesth, dl, cindy/echo   . TEE WITHOUT CARDIOVERSION N/A 11/12/2019   Procedure: TRANSESOPHAGEAL ECHOCARDIOGRAM (TEE);  Surgeon: [name], MD;  Location: [facility] ENDOSCOPY;  Service: Cardiovascular;  Laterality: N/A;  . TEE WITHOUT CARDIOVERSION N/A 11/13/2019   Procedure: TRANSESOPHAGEAL ECHOCARDIOGRAM (TEE);  Surgeon: [name], MD;  Location: [facility] OR;  Service: Cardiovascular;  Laterality: N/A;     Family History  Problem Relation Age of Onset  . Arthritis Mother   . Heart disease Mother   . Heart attack Mother   . Other Father        smoker  . Hypertension Neg Hx        unknown  .  Stroke Neg Hx        unknown     Social History   Socioeconomic History  . Marital status: Married    Spouse name: Not on file  . Number of children: Not on file  . Years of education: Not on file  . Highest education level: Not on file  Occupational History  . Not on file  Social Needs  . Financial resource strain: Not on file  . Food insecurity    Worry: Not on file    Inability: Not on file  . Transportation needs    Medical: Not on file    Non-medical: Not on file  Tobacco Use  . Smoking status: Former Smoker    Types: Pipe    Quit date: 09/21/2008    Years since quitting: 11.1  . Smokeless tobacco: Former Systems developer    Quit date: 09/21/2008  Substance and Sexual Activity  . Alcohol use: No  . Drug use: No  . Sexual activity: Not Currently    Birth control/protection: None  Lifestyle  . Physical activity    Days per week: Not on file    Minutes per session: Not on file  . Stress: Not on file  Relationships  . Social Herbalist on phone: Not on file    Gets together: Not on file    Attends religious service: Not on file    Active member of club or  organization: Not on file    Attends meetings of clubs or organizations: Not on file    Relationship status: Not on file  . Intimate partner violence    Fear of current or ex partner: Not on file    Emotionally abused: Not on file    Physically abused: Not on file    Forced sexual activity: Not on file  Other Topics Concern  . Not on file  Social History Narrative   ** Merged History Encounter **         BP 102/68   Pulse 90   Ht 5\' 11"  (1.803 m)   Wt 186 lb 6.4 oz (84.6 kg)   SpO2 99%   BMI 26.00 kg/m   Physical Exam:  Well appearing NAD HEENT: Unremarkable Neck:  No JVD, no thyromegally Lymphatics:  No adenopathy Back:  No CVA tenderness Lungs:  Clear with no wheezes; incision demonstrate a hard hematoma with minimal oozing on the lateral aspect. I removed 2 sutures. HEART:  Regular rate rhythm, no murmurs, no rubs, no clicks Abd:  soft, positive bowel sounds, no organomegally, no rebound, no guarding Ext:  2 plus pulses, no edema, no cyanosis, no clubbing Skin:  No rashes no nodules Neuro:  CN II through XII intact, motor grossly intact   Assess/Plan: 1. Hematoma - his pocket hematoma appears to be stable. He will undergo watchful waiting. 2. Chronic systolic heart failure - his symptoms are class 2. We will follow. 3. Coagulopathy - he has no active bleeding. He will need to followup in the Heme/onc clinic in [facility].   Mikle Bosworth.D.

## 2019-11-26 NOTE — Patient Instructions (Addendum)
Medication Instructions:  Your physician recommends that you continue on your current medications as directed. Please refer to the Current Medication list given to you today.  Labwork: None ordered.  Testing/Procedures: None ordered.  Follow-Up: Your physician wants you to follow-up with Dr. Lovena Le as scheduled:  December 15, 2019 at 11:45 am   Any Other Special Instructions Will Be Listed Below (If Applicable).  If you need a refill on your cardiac medications before your next appointment, please call your pharmacy.

## 2019-11-27 ENCOUNTER — Telehealth: Payer: Self-pay | Admitting: Internal Medicine

## 2019-11-27 DIAGNOSIS — M1712 Unilateral primary osteoarthritis, left knee: Secondary | ICD-10-CM | POA: Diagnosis not present

## 2019-11-27 DIAGNOSIS — G4700 Insomnia, unspecified: Secondary | ICD-10-CM | POA: Diagnosis not present

## 2019-11-27 DIAGNOSIS — I4891 Unspecified atrial fibrillation: Secondary | ICD-10-CM | POA: Diagnosis not present

## 2019-11-27 DIAGNOSIS — D689 Coagulation defect, unspecified: Secondary | ICD-10-CM | POA: Diagnosis not present

## 2019-11-27 DIAGNOSIS — Z48812 Encounter for surgical aftercare following surgery on the circulatory system: Secondary | ICD-10-CM | POA: Diagnosis not present

## 2019-11-27 DIAGNOSIS — Z4801 Encounter for change or removal of surgical wound dressing: Secondary | ICD-10-CM | POA: Diagnosis not present

## 2019-11-27 DIAGNOSIS — M6281 Muscle weakness (generalized): Secondary | ICD-10-CM | POA: Diagnosis not present

## 2019-11-27 DIAGNOSIS — I2510 Atherosclerotic heart disease of native coronary artery without angina pectoris: Secondary | ICD-10-CM | POA: Diagnosis not present

## 2019-11-27 DIAGNOSIS — D631 Anemia in chronic kidney disease: Secondary | ICD-10-CM | POA: Diagnosis not present

## 2019-11-27 DIAGNOSIS — Z8673 Personal history of transient ischemic attack (TIA), and cerebral infarction without residual deficits: Secondary | ICD-10-CM | POA: Diagnosis not present

## 2019-11-27 DIAGNOSIS — R296 Repeated falls: Secondary | ICD-10-CM | POA: Diagnosis not present

## 2019-11-27 DIAGNOSIS — I255 Ischemic cardiomyopathy: Secondary | ICD-10-CM | POA: Diagnosis not present

## 2019-11-27 DIAGNOSIS — N183 Chronic kidney disease, stage 3 unspecified: Secondary | ICD-10-CM | POA: Diagnosis not present

## 2019-11-27 DIAGNOSIS — I5022 Chronic systolic (congestive) heart failure: Secondary | ICD-10-CM | POA: Diagnosis not present

## 2019-11-27 DIAGNOSIS — M109 Gout, unspecified: Secondary | ICD-10-CM | POA: Diagnosis not present

## 2019-11-27 DIAGNOSIS — E785 Hyperlipidemia, unspecified: Secondary | ICD-10-CM | POA: Diagnosis not present

## 2019-11-27 DIAGNOSIS — I130 Hypertensive heart and chronic kidney disease with heart failure and stage 1 through stage 4 chronic kidney disease, or unspecified chronic kidney disease: Secondary | ICD-10-CM | POA: Diagnosis not present

## 2019-11-27 NOTE — Progress Notes (Addendum)
Pocket pal compression device and abd pad  Removed. Quarter size amount of fresh blood on inner dressing of 4 X 4 s.. Reports home health checked wound this morning ABD pad and 4X4s were saturated approximately 3 hours before this appointment. Sutures in place, edema at and around incision site. Enedina Finner PA observed wound, 3 sutures removed and patient to follow up with Dr Lovena Le 11/26/19 for assessment. Wound dressed and pocket pal compression device replaced.

## 2019-11-27 NOTE — Telephone Encounter (Signed)
Home Health Verbal Orders - Caller/Agency: Kathleen/ [facility] Number: [phone] vm can be left  Requesting PT Frequency: 1x a week for 1 week 2x's a week for 4 weeks 1x a week for 3 weeks

## 2019-11-27 NOTE — Telephone Encounter (Signed)
Routing to dr crawford---this is an additional home health order request for OT---the former request was for PT----please advise, thanks

## 2019-11-27 NOTE — Telephone Encounter (Signed)
Home Health OT was calling to ask about precautions for this patient following a recent surgery. Please call

## 2019-11-27 NOTE — Telephone Encounter (Signed)
Fine

## 2019-11-27 NOTE — Telephone Encounter (Signed)
Home Health Verbal Orders - Caller/AgencySherrine Maples  [facility] Callback Number: [phone] Requesting OT/PT/Skilled Nursing/Social Work/Speech Therapy: OT Frequency: 1 time week for 1 week , twice for two weeks, 1 week for three weeks

## 2019-11-27 NOTE — Telephone Encounter (Signed)
Louis Ford of dr crawfords ok for orders requested

## 2019-11-27 NOTE — Telephone Encounter (Signed)
LMTCB for [place] with [facility] OT

## 2019-11-30 DIAGNOSIS — I130 Hypertensive heart and chronic kidney disease with heart failure and stage 1 through stage 4 chronic kidney disease, or unspecified chronic kidney disease: Secondary | ICD-10-CM | POA: Diagnosis not present

## 2019-11-30 DIAGNOSIS — D689 Coagulation defect, unspecified: Secondary | ICD-10-CM | POA: Diagnosis not present

## 2019-11-30 DIAGNOSIS — N183 Chronic kidney disease, stage 3 unspecified: Secondary | ICD-10-CM | POA: Diagnosis not present

## 2019-11-30 DIAGNOSIS — Z4801 Encounter for change or removal of surgical wound dressing: Secondary | ICD-10-CM | POA: Diagnosis not present

## 2019-11-30 DIAGNOSIS — I2510 Atherosclerotic heart disease of native coronary artery without angina pectoris: Secondary | ICD-10-CM | POA: Diagnosis not present

## 2019-11-30 DIAGNOSIS — I255 Ischemic cardiomyopathy: Secondary | ICD-10-CM | POA: Diagnosis not present

## 2019-11-30 DIAGNOSIS — R296 Repeated falls: Secondary | ICD-10-CM | POA: Diagnosis not present

## 2019-11-30 DIAGNOSIS — M6281 Muscle weakness (generalized): Secondary | ICD-10-CM | POA: Diagnosis not present

## 2019-11-30 DIAGNOSIS — Z8673 Personal history of transient ischemic attack (TIA), and cerebral infarction without residual deficits: Secondary | ICD-10-CM | POA: Diagnosis not present

## 2019-11-30 DIAGNOSIS — M1712 Unilateral primary osteoarthritis, left knee: Secondary | ICD-10-CM | POA: Diagnosis not present

## 2019-11-30 DIAGNOSIS — D631 Anemia in chronic kidney disease: Secondary | ICD-10-CM | POA: Diagnosis not present

## 2019-11-30 DIAGNOSIS — Z48812 Encounter for surgical aftercare following surgery on the circulatory system: Secondary | ICD-10-CM | POA: Diagnosis not present

## 2019-11-30 DIAGNOSIS — I4891 Unspecified atrial fibrillation: Secondary | ICD-10-CM | POA: Diagnosis not present

## 2019-11-30 DIAGNOSIS — E785 Hyperlipidemia, unspecified: Secondary | ICD-10-CM | POA: Diagnosis not present

## 2019-11-30 DIAGNOSIS — G4700 Insomnia, unspecified: Secondary | ICD-10-CM | POA: Diagnosis not present

## 2019-11-30 DIAGNOSIS — I5022 Chronic systolic (congestive) heart failure: Secondary | ICD-10-CM | POA: Diagnosis not present

## 2019-11-30 DIAGNOSIS — M109 Gout, unspecified: Secondary | ICD-10-CM | POA: Diagnosis not present

## 2019-11-30 NOTE — Telephone Encounter (Signed)
lvm advising mall[facility] ok for OT orders per dr crawfords note---call back if any further questions

## 2019-12-01 NOTE — Telephone Encounter (Signed)
Left detailed message for Louis Ford  Advised Pt does not have new device, so no specific restrictions.  Advised to use medical sense when working with pt d/t hematoma and bleeding at surgical site.

## 2019-12-03 ENCOUNTER — Telehealth: Payer: Self-pay | Admitting: Hematology

## 2019-12-03 ENCOUNTER — Encounter: Payer: Self-pay | Admitting: Internal Medicine

## 2019-12-03 ENCOUNTER — Other Ambulatory Visit: Payer: Self-pay | Admitting: Internal Medicine

## 2019-12-03 DIAGNOSIS — Z4801 Encounter for change or removal of surgical wound dressing: Secondary | ICD-10-CM | POA: Diagnosis not present

## 2019-12-03 DIAGNOSIS — I4891 Unspecified atrial fibrillation: Secondary | ICD-10-CM | POA: Diagnosis not present

## 2019-12-03 DIAGNOSIS — N183 Chronic kidney disease, stage 3 unspecified: Secondary | ICD-10-CM | POA: Diagnosis not present

## 2019-12-03 DIAGNOSIS — R296 Repeated falls: Secondary | ICD-10-CM | POA: Diagnosis not present

## 2019-12-03 DIAGNOSIS — D689 Coagulation defect, unspecified: Secondary | ICD-10-CM | POA: Diagnosis not present

## 2019-12-03 DIAGNOSIS — I2510 Atherosclerotic heart disease of native coronary artery without angina pectoris: Secondary | ICD-10-CM | POA: Diagnosis not present

## 2019-12-03 DIAGNOSIS — D631 Anemia in chronic kidney disease: Secondary | ICD-10-CM | POA: Diagnosis not present

## 2019-12-03 DIAGNOSIS — M6281 Muscle weakness (generalized): Secondary | ICD-10-CM | POA: Diagnosis not present

## 2019-12-03 DIAGNOSIS — M1712 Unilateral primary osteoarthritis, left knee: Secondary | ICD-10-CM | POA: Diagnosis not present

## 2019-12-03 DIAGNOSIS — E785 Hyperlipidemia, unspecified: Secondary | ICD-10-CM | POA: Diagnosis not present

## 2019-12-03 DIAGNOSIS — Z8673 Personal history of transient ischemic attack (TIA), and cerebral infarction without residual deficits: Secondary | ICD-10-CM | POA: Diagnosis not present

## 2019-12-03 DIAGNOSIS — I255 Ischemic cardiomyopathy: Secondary | ICD-10-CM | POA: Diagnosis not present

## 2019-12-03 DIAGNOSIS — I5022 Chronic systolic (congestive) heart failure: Secondary | ICD-10-CM | POA: Diagnosis not present

## 2019-12-03 DIAGNOSIS — Z48812 Encounter for surgical aftercare following surgery on the circulatory system: Secondary | ICD-10-CM | POA: Diagnosis not present

## 2019-12-03 DIAGNOSIS — I130 Hypertensive heart and chronic kidney disease with heart failure and stage 1 through stage 4 chronic kidney disease, or unspecified chronic kidney disease: Secondary | ICD-10-CM | POA: Diagnosis not present

## 2019-12-03 DIAGNOSIS — M109 Gout, unspecified: Secondary | ICD-10-CM | POA: Diagnosis not present

## 2019-12-03 DIAGNOSIS — G4700 Insomnia, unspecified: Secondary | ICD-10-CM | POA: Diagnosis not present

## 2019-12-03 NOTE — Telephone Encounter (Signed)
FAXED RECORDS TO [facility] ATTN-MEKA    FAX [phone]

## 2019-12-05 DIAGNOSIS — G4700 Insomnia, unspecified: Secondary | ICD-10-CM | POA: Diagnosis not present

## 2019-12-05 DIAGNOSIS — D631 Anemia in chronic kidney disease: Secondary | ICD-10-CM | POA: Diagnosis not present

## 2019-12-05 DIAGNOSIS — I130 Hypertensive heart and chronic kidney disease with heart failure and stage 1 through stage 4 chronic kidney disease, or unspecified chronic kidney disease: Secondary | ICD-10-CM | POA: Diagnosis not present

## 2019-12-05 DIAGNOSIS — I2510 Atherosclerotic heart disease of native coronary artery without angina pectoris: Secondary | ICD-10-CM | POA: Diagnosis not present

## 2019-12-05 DIAGNOSIS — I5022 Chronic systolic (congestive) heart failure: Secondary | ICD-10-CM | POA: Diagnosis not present

## 2019-12-05 DIAGNOSIS — R296 Repeated falls: Secondary | ICD-10-CM | POA: Diagnosis not present

## 2019-12-05 DIAGNOSIS — I4891 Unspecified atrial fibrillation: Secondary | ICD-10-CM | POA: Diagnosis not present

## 2019-12-05 DIAGNOSIS — Z4801 Encounter for change or removal of surgical wound dressing: Secondary | ICD-10-CM | POA: Diagnosis not present

## 2019-12-05 DIAGNOSIS — D689 Coagulation defect, unspecified: Secondary | ICD-10-CM | POA: Diagnosis not present

## 2019-12-05 DIAGNOSIS — M1712 Unilateral primary osteoarthritis, left knee: Secondary | ICD-10-CM | POA: Diagnosis not present

## 2019-12-05 DIAGNOSIS — M109 Gout, unspecified: Secondary | ICD-10-CM | POA: Diagnosis not present

## 2019-12-05 DIAGNOSIS — Z8673 Personal history of transient ischemic attack (TIA), and cerebral infarction without residual deficits: Secondary | ICD-10-CM | POA: Diagnosis not present

## 2019-12-05 DIAGNOSIS — I255 Ischemic cardiomyopathy: Secondary | ICD-10-CM | POA: Diagnosis not present

## 2019-12-05 DIAGNOSIS — N183 Chronic kidney disease, stage 3 unspecified: Secondary | ICD-10-CM | POA: Diagnosis not present

## 2019-12-05 DIAGNOSIS — E785 Hyperlipidemia, unspecified: Secondary | ICD-10-CM | POA: Diagnosis not present

## 2019-12-05 DIAGNOSIS — M6281 Muscle weakness (generalized): Secondary | ICD-10-CM | POA: Diagnosis not present

## 2019-12-05 DIAGNOSIS — Z48812 Encounter for surgical aftercare following surgery on the circulatory system: Secondary | ICD-10-CM | POA: Diagnosis not present

## 2019-12-07 ENCOUNTER — Encounter: Payer: Self-pay | Admitting: Internal Medicine

## 2019-12-07 DIAGNOSIS — E785 Hyperlipidemia, unspecified: Secondary | ICD-10-CM | POA: Diagnosis not present

## 2019-12-07 DIAGNOSIS — I255 Ischemic cardiomyopathy: Secondary | ICD-10-CM | POA: Diagnosis not present

## 2019-12-07 DIAGNOSIS — M6281 Muscle weakness (generalized): Secondary | ICD-10-CM | POA: Diagnosis not present

## 2019-12-07 DIAGNOSIS — N183 Chronic kidney disease, stage 3 unspecified: Secondary | ICD-10-CM | POA: Diagnosis not present

## 2019-12-07 DIAGNOSIS — M1712 Unilateral primary osteoarthritis, left knee: Secondary | ICD-10-CM | POA: Diagnosis not present

## 2019-12-07 DIAGNOSIS — R296 Repeated falls: Secondary | ICD-10-CM | POA: Diagnosis not present

## 2019-12-07 DIAGNOSIS — I2510 Atherosclerotic heart disease of native coronary artery without angina pectoris: Secondary | ICD-10-CM | POA: Diagnosis not present

## 2019-12-07 DIAGNOSIS — Z4801 Encounter for change or removal of surgical wound dressing: Secondary | ICD-10-CM | POA: Diagnosis not present

## 2019-12-07 DIAGNOSIS — D689 Coagulation defect, unspecified: Secondary | ICD-10-CM | POA: Diagnosis not present

## 2019-12-07 DIAGNOSIS — M109 Gout, unspecified: Secondary | ICD-10-CM | POA: Diagnosis not present

## 2019-12-07 DIAGNOSIS — Z8673 Personal history of transient ischemic attack (TIA), and cerebral infarction without residual deficits: Secondary | ICD-10-CM | POA: Diagnosis not present

## 2019-12-07 DIAGNOSIS — I5022 Chronic systolic (congestive) heart failure: Secondary | ICD-10-CM | POA: Diagnosis not present

## 2019-12-07 DIAGNOSIS — I4891 Unspecified atrial fibrillation: Secondary | ICD-10-CM | POA: Diagnosis not present

## 2019-12-07 DIAGNOSIS — G4700 Insomnia, unspecified: Secondary | ICD-10-CM | POA: Diagnosis not present

## 2019-12-07 DIAGNOSIS — I130 Hypertensive heart and chronic kidney disease with heart failure and stage 1 through stage 4 chronic kidney disease, or unspecified chronic kidney disease: Secondary | ICD-10-CM | POA: Diagnosis not present

## 2019-12-07 DIAGNOSIS — D631 Anemia in chronic kidney disease: Secondary | ICD-10-CM | POA: Diagnosis not present

## 2019-12-07 DIAGNOSIS — Z48812 Encounter for surgical aftercare following surgery on the circulatory system: Secondary | ICD-10-CM | POA: Diagnosis not present

## 2019-12-09 DIAGNOSIS — E785 Hyperlipidemia, unspecified: Secondary | ICD-10-CM | POA: Diagnosis not present

## 2019-12-09 DIAGNOSIS — D689 Coagulation defect, unspecified: Secondary | ICD-10-CM | POA: Diagnosis not present

## 2019-12-09 DIAGNOSIS — Z48812 Encounter for surgical aftercare following surgery on the circulatory system: Secondary | ICD-10-CM | POA: Diagnosis not present

## 2019-12-09 DIAGNOSIS — G4700 Insomnia, unspecified: Secondary | ICD-10-CM | POA: Diagnosis not present

## 2019-12-09 DIAGNOSIS — D631 Anemia in chronic kidney disease: Secondary | ICD-10-CM | POA: Diagnosis not present

## 2019-12-09 DIAGNOSIS — Z4801 Encounter for change or removal of surgical wound dressing: Secondary | ICD-10-CM | POA: Diagnosis not present

## 2019-12-09 DIAGNOSIS — M6281 Muscle weakness (generalized): Secondary | ICD-10-CM | POA: Diagnosis not present

## 2019-12-09 DIAGNOSIS — Z8673 Personal history of transient ischemic attack (TIA), and cerebral infarction without residual deficits: Secondary | ICD-10-CM | POA: Diagnosis not present

## 2019-12-09 DIAGNOSIS — I5022 Chronic systolic (congestive) heart failure: Secondary | ICD-10-CM | POA: Diagnosis not present

## 2019-12-09 DIAGNOSIS — I130 Hypertensive heart and chronic kidney disease with heart failure and stage 1 through stage 4 chronic kidney disease, or unspecified chronic kidney disease: Secondary | ICD-10-CM | POA: Diagnosis not present

## 2019-12-09 DIAGNOSIS — N183 Chronic kidney disease, stage 3 unspecified: Secondary | ICD-10-CM | POA: Diagnosis not present

## 2019-12-09 DIAGNOSIS — I4891 Unspecified atrial fibrillation: Secondary | ICD-10-CM | POA: Diagnosis not present

## 2019-12-09 DIAGNOSIS — M109 Gout, unspecified: Secondary | ICD-10-CM | POA: Diagnosis not present

## 2019-12-09 DIAGNOSIS — R296 Repeated falls: Secondary | ICD-10-CM | POA: Diagnosis not present

## 2019-12-09 DIAGNOSIS — I2510 Atherosclerotic heart disease of native coronary artery without angina pectoris: Secondary | ICD-10-CM | POA: Diagnosis not present

## 2019-12-09 DIAGNOSIS — I255 Ischemic cardiomyopathy: Secondary | ICD-10-CM | POA: Diagnosis not present

## 2019-12-09 DIAGNOSIS — M1712 Unilateral primary osteoarthritis, left knee: Secondary | ICD-10-CM | POA: Diagnosis not present

## 2019-12-10 DIAGNOSIS — I5022 Chronic systolic (congestive) heart failure: Secondary | ICD-10-CM | POA: Diagnosis not present

## 2019-12-10 DIAGNOSIS — I4891 Unspecified atrial fibrillation: Secondary | ICD-10-CM | POA: Diagnosis not present

## 2019-12-10 DIAGNOSIS — I2510 Atherosclerotic heart disease of native coronary artery without angina pectoris: Secondary | ICD-10-CM | POA: Diagnosis not present

## 2019-12-10 DIAGNOSIS — Z4801 Encounter for change or removal of surgical wound dressing: Secondary | ICD-10-CM | POA: Diagnosis not present

## 2019-12-10 DIAGNOSIS — E785 Hyperlipidemia, unspecified: Secondary | ICD-10-CM | POA: Diagnosis not present

## 2019-12-10 DIAGNOSIS — N183 Chronic kidney disease, stage 3 unspecified: Secondary | ICD-10-CM | POA: Diagnosis not present

## 2019-12-10 DIAGNOSIS — G4700 Insomnia, unspecified: Secondary | ICD-10-CM | POA: Diagnosis not present

## 2019-12-10 DIAGNOSIS — I255 Ischemic cardiomyopathy: Secondary | ICD-10-CM | POA: Diagnosis not present

## 2019-12-10 DIAGNOSIS — D689 Coagulation defect, unspecified: Secondary | ICD-10-CM | POA: Diagnosis not present

## 2019-12-10 DIAGNOSIS — R296 Repeated falls: Secondary | ICD-10-CM | POA: Diagnosis not present

## 2019-12-10 DIAGNOSIS — M6281 Muscle weakness (generalized): Secondary | ICD-10-CM | POA: Diagnosis not present

## 2019-12-10 DIAGNOSIS — M1712 Unilateral primary osteoarthritis, left knee: Secondary | ICD-10-CM | POA: Diagnosis not present

## 2019-12-10 DIAGNOSIS — Z48812 Encounter for surgical aftercare following surgery on the circulatory system: Secondary | ICD-10-CM | POA: Diagnosis not present

## 2019-12-10 DIAGNOSIS — I130 Hypertensive heart and chronic kidney disease with heart failure and stage 1 through stage 4 chronic kidney disease, or unspecified chronic kidney disease: Secondary | ICD-10-CM | POA: Diagnosis not present

## 2019-12-10 DIAGNOSIS — D631 Anemia in chronic kidney disease: Secondary | ICD-10-CM | POA: Diagnosis not present

## 2019-12-10 DIAGNOSIS — M109 Gout, unspecified: Secondary | ICD-10-CM | POA: Diagnosis not present

## 2019-12-10 DIAGNOSIS — Z8673 Personal history of transient ischemic attack (TIA), and cerebral infarction without residual deficits: Secondary | ICD-10-CM | POA: Diagnosis not present

## 2019-12-11 ENCOUNTER — Other Ambulatory Visit (INDEPENDENT_AMBULATORY_CARE_PROVIDER_SITE_OTHER): Payer: Medicare Other

## 2019-12-11 ENCOUNTER — Ambulatory Visit: Payer: Self-pay

## 2019-12-11 ENCOUNTER — Encounter: Payer: Self-pay | Admitting: Internal Medicine

## 2019-12-11 ENCOUNTER — Other Ambulatory Visit: Payer: Self-pay | Admitting: Internal Medicine

## 2019-12-11 ENCOUNTER — Ambulatory Visit (INDEPENDENT_AMBULATORY_CARE_PROVIDER_SITE_OTHER): Payer: Medicare Other | Admitting: Internal Medicine

## 2019-12-11 ENCOUNTER — Other Ambulatory Visit: Payer: Self-pay

## 2019-12-11 VITALS — BP 118/86 | HR 80 | Temp 97.9°F | Ht 71.0 in | Wt 188.0 lb

## 2019-12-11 DIAGNOSIS — R310 Gross hematuria: Secondary | ICD-10-CM

## 2019-12-11 DIAGNOSIS — D689 Coagulation defect, unspecified: Secondary | ICD-10-CM

## 2019-12-11 LAB — COMPREHENSIVE METABOLIC PANEL
ALT: 12 U/L (ref 0–53)
AST: 16 U/L (ref 0–37)
Albumin: 3.4 g/dL — ABNORMAL LOW (ref 3.5–5.2)
Alkaline Phosphatase: 125 U/L — ABNORMAL HIGH (ref 39–117)
BUN: 27 mg/dL — ABNORMAL HIGH (ref 6–23)
CO2: 27 mEq/L (ref 19–32)
Calcium: 8.9 mg/dL (ref 8.4–10.5)
Chloride: 102 mEq/L (ref 96–112)
Creatinine, Ser: 1.05 mg/dL (ref 0.40–1.50)
GFR: 84.24 mL/min (ref >60.00)
Glucose, Bld: 77 mg/dL (ref 70–99)
Potassium: 3.8 mEq/L (ref 3.5–5.1)
Sodium: 132 mEq/L — ABNORMAL LOW (ref 135–145)
Total Bilirubin: 0.3 mg/dL (ref 0.2–1.2)
Total Protein: 10.9 g/dL — ABNORMAL HIGH (ref 6.0–8.3)

## 2019-12-11 LAB — CBC WITH DIFFERENTIAL/PLATELET
Basophils Absolute: 0 10*3/uL (ref 0.0–0.1)
Basophils Relative: 0.7 % (ref 0.0–3.0)
Eosinophils Absolute: 0.3 10*3/uL (ref 0.0–0.7)
Eosinophils Relative: 7.4 % — ABNORMAL HIGH (ref 0.0–5.0)
HCT: 40 % (ref 39.0–52.0)
Hemoglobin: 13 g/dL (ref 13.0–17.0)
Lymphocytes Relative: 20.7 % (ref 12.0–46.0)
Lymphs Abs: 0.9 10*3/uL (ref 0.7–4.0)
MCHC: 32.6 g/dL (ref 30.0–36.0)
MCV: 93.7 fl (ref 78.0–100.0)
Monocytes Absolute: 0.5 10*3/uL (ref 0.1–1.0)
Monocytes Relative: 11.6 % (ref 3.0–12.0)
Neutro Abs: 2.5 10*3/uL (ref 1.4–7.7)
Neutrophils Relative %: 59.6 % (ref 43.0–77.0)
Platelets: 202 10*3/uL (ref 150.0–400.0)
RBC: 4.27 Mil/uL (ref 4.22–5.81)
RDW: 16.4 % — ABNORMAL HIGH (ref 11.5–15.5)
WBC: 4.2 10*3/uL (ref 4.0–10.5)

## 2019-12-11 LAB — APTT: aPTT: 45.6 s — ABNORMAL HIGH (ref 23.4–32.7)

## 2019-12-11 LAB — URINALYSIS, ROUTINE W REFLEX MICROSCOPIC
Bilirubin Urine: NEGATIVE
Ketones, ur: NEGATIVE
Nitrite: NEGATIVE
Specific Gravity, Urine: 1.025 (ref 1.000–1.030)
Total Protein, Urine: >=300 — AB
Urine Glucose: NEGATIVE
Urobilinogen, UA: 0.2 (ref 0.0–1.0)
pH: 6 (ref 5.0–8.0)

## 2019-12-11 LAB — PROTIME-INR
INR: 1.5 ratio — ABNORMAL HIGH (ref 0.8–1.0)
Prothrombin Time: 16.9 s — ABNORMAL HIGH (ref 9.6–13.1)

## 2019-12-11 MED ORDER — SULFAMETHOXAZOLE-TRIMETHOPRIM 800-160 MG PO TABS: 1.0000 | ORAL_TABLET | Freq: Two times a day (BID) | ORAL | 0 refills | Status: DC

## 2019-12-11 NOTE — Assessment & Plan Note (Signed)
Given coagulopathy he needs urgent urology referral as he will likely need intervention if bleeding continues. U/A and culture ordered today with sample dark red with clots in specimen. Checking CBC with diff, PT/INR, PTT. He is not taking any anticoagulants including nsaids, aspirin.

## 2019-12-11 NOTE — Patient Instructions (Signed)
We are checking the urine and labs today. We will call you back today with the results.   We are going to get you in with urology as soon as possible.   Monitor the urine closely and drink plenty of fluids. If you have continued blood in urine call us back.   If at any time you get chest pains, lightheadedness, dizziness go to the ER or urgent care.   If this has not stopped or almost gone by Monday call us back then.

## 2019-12-11 NOTE — Progress Notes (Signed)
   Subjective:   Patient ID: Louis Ford., male    DOB: Jun 25, 1946, 73 y.o.   MRN: [id number]  HPI The patient is a 73 YO man coming in for blood in urine. Started last night. Denies lightheadedness or dizziness. Denies SOB or chest pains. There are blood clots. Overall it is not improving. Has tried nothing. Had recent procedure 11/13/19 for ICD lead removal. Had a fall about 1 week ago but is feeling well from that. Denies fevers or chills or cough. He has had a bleeding problem in the last year with excessive bleeding during surgical procedures consistently. This is not clear and he is supposed to be going to [facility] in the near future for more evaluation. He denies other bleeding currently including nosebleed or blood in stool. Denies pain with urination, frequency, urgency. Denies constipation or diarrhea. Denies abdominal pain, nausea, vomiting. Denies back pain. He does not take any aspirin, nsaids. Only otc is tylenol.   Review of Systems  Constitutional: Negative.   HENT: Negative.   Eyes: Negative.   Respiratory: Negative for cough, chest tightness and shortness of breath.   Cardiovascular: Negative for chest pain, palpitations and leg swelling.  Gastrointestinal: Negative for abdominal distention, abdominal pain, constipation, diarrhea, nausea and vomiting.  Genitourinary: Positive for hematuria.  Musculoskeletal: Negative.   Skin: Negative.   Neurological: Negative.   Psychiatric/Behavioral: Negative.     Objective:  Physical Exam Constitutional:      Appearance: He is well-developed.  HENT:     Head: Normocephalic and atraumatic.  Cardiovascular:     Rate and Rhythm: Normal rate and regular rhythm.  Pulmonary:     Effort: Pulmonary effort is normal. No respiratory distress.     Breath sounds: Normal breath sounds. No wheezing or rales.  Abdominal:     General: Bowel sounds are normal. There is no distension.     Palpations: Abdomen is soft.     Tenderness: There is no  abdominal tenderness. There is no rebound.  Musculoskeletal:     Cervical back: Normal range of motion.  Skin:    General: Skin is warm and dry.  Neurological:     Mental Status: He is alert and oriented to person, place, and time.     Coordination: Coordination normal.     Vitals:   12/11/19 1056  BP: 118/86  Pulse: 80  Temp: 97.9 F (36.6 C)  TempSrc: Oral  Weight: 188 lb (85.3 kg)  Height: 5\' 11"  (1.803 m)    This visit occurred during the SARS-CoV-2 public health emergency.  Safety protocols were in place, including screening questions prior to the visit, additional usage of staff PPE, and extensive cleaning of exam room while observing appropriate contact time as indicated for disinfecting solutions.   Assessment & Plan:  Visit time 25 minutes: greater than 50% of that time was spent in face to face counseling and coordination of care with the patient: counseled about potential serious nature and if not improving may need ER or urgent care.

## 2019-12-11 NOTE — Telephone Encounter (Signed)
Noted thank you

## 2019-12-11 NOTE — Telephone Encounter (Signed)
  Pt. And wife report he started seeing blood in his urine yesterday. Urine is red in color. Denies any pain or difficulty voiding. Requests appointment. Call dropped - Gareth Eagle in the practice will call pt. Back. Answer Assessment - Initial Assessment Questions 1. COLOR of URINE: "Describe the color of the urine."  (e.g., tea-colored, pink, red, blood clots, bloody)     Red 2. ONSET: "When did the bleeding start?"      Yesterday 3. EPISODES: "How many times has there been blood in the urine?" or "How many times today?"     Every time he voids 4. PAIN with URINATION: "Is there any pain with passing your urine?" If so, ask: "How bad is the pain?"  (Scale 1-10; or mild, moderate, severe)    - MILD - complains slightly about urination hurting    - MODERATE - interferes with normal activities      - SEVERE - excruciating, unwilling or unable to urinate because of the pain      No pain 5. FEVER: "Do you have a fever?" If so, ask: "What is your temperature, how was it measured, and when did it start?"     No 6. ASSOCIATED SYMPTOMS: "Are you passing urine more frequently than usual?"     No 7. OTHER SYMPTOMS: "Do you have any other symptoms?" (e.g., back/flank pain, abdominal pain, vomiting)     No 8. PREGNANCY: "Is there any chance you are pregnant?" "When was your last menstrual period?"     n/a  Protocols used: URINE - BLOOD IN-A-AH

## 2019-12-11 NOTE — Assessment & Plan Note (Signed)
Checking CBC with diff, PT/INR, PTT today given new spontaneous gross hematuria.

## 2019-12-13 LAB — URINE CULTURE
MICRO NUMBER:: 1212650
SPECIMEN QUALITY:: ADEQUATE

## 2019-12-14 DIAGNOSIS — N183 Chronic kidney disease, stage 3 unspecified: Secondary | ICD-10-CM | POA: Diagnosis not present

## 2019-12-14 DIAGNOSIS — Z48812 Encounter for surgical aftercare following surgery on the circulatory system: Secondary | ICD-10-CM | POA: Diagnosis not present

## 2019-12-14 DIAGNOSIS — Z8673 Personal history of transient ischemic attack (TIA), and cerebral infarction without residual deficits: Secondary | ICD-10-CM | POA: Diagnosis not present

## 2019-12-14 DIAGNOSIS — M109 Gout, unspecified: Secondary | ICD-10-CM | POA: Diagnosis not present

## 2019-12-14 DIAGNOSIS — I130 Hypertensive heart and chronic kidney disease with heart failure and stage 1 through stage 4 chronic kidney disease, or unspecified chronic kidney disease: Secondary | ICD-10-CM | POA: Diagnosis not present

## 2019-12-14 DIAGNOSIS — D689 Coagulation defect, unspecified: Secondary | ICD-10-CM | POA: Diagnosis not present

## 2019-12-14 DIAGNOSIS — D631 Anemia in chronic kidney disease: Secondary | ICD-10-CM | POA: Diagnosis not present

## 2019-12-14 DIAGNOSIS — M6281 Muscle weakness (generalized): Secondary | ICD-10-CM | POA: Diagnosis not present

## 2019-12-14 DIAGNOSIS — M1712 Unilateral primary osteoarthritis, left knee: Secondary | ICD-10-CM | POA: Diagnosis not present

## 2019-12-14 DIAGNOSIS — I5022 Chronic systolic (congestive) heart failure: Secondary | ICD-10-CM | POA: Diagnosis not present

## 2019-12-14 DIAGNOSIS — G4700 Insomnia, unspecified: Secondary | ICD-10-CM | POA: Diagnosis not present

## 2019-12-14 DIAGNOSIS — E785 Hyperlipidemia, unspecified: Secondary | ICD-10-CM | POA: Diagnosis not present

## 2019-12-14 DIAGNOSIS — I255 Ischemic cardiomyopathy: Secondary | ICD-10-CM | POA: Diagnosis not present

## 2019-12-14 DIAGNOSIS — Z4801 Encounter for change or removal of surgical wound dressing: Secondary | ICD-10-CM | POA: Diagnosis not present

## 2019-12-14 DIAGNOSIS — I2510 Atherosclerotic heart disease of native coronary artery without angina pectoris: Secondary | ICD-10-CM | POA: Diagnosis not present

## 2019-12-14 DIAGNOSIS — I4891 Unspecified atrial fibrillation: Secondary | ICD-10-CM | POA: Diagnosis not present

## 2019-12-14 DIAGNOSIS — R296 Repeated falls: Secondary | ICD-10-CM | POA: Diagnosis not present

## 2019-12-15 ENCOUNTER — Encounter: Payer: Self-pay | Admitting: Internal Medicine

## 2019-12-15 ENCOUNTER — Ambulatory Visit (INDEPENDENT_AMBULATORY_CARE_PROVIDER_SITE_OTHER): Payer: Medicare Other | Admitting: Internal Medicine

## 2019-12-15 ENCOUNTER — Other Ambulatory Visit: Payer: Self-pay

## 2019-12-15 VITALS — BP 116/76 | HR 85 | Ht 71.0 in | Wt 188.0 lb

## 2019-12-15 DIAGNOSIS — D689 Coagulation defect, unspecified: Secondary | ICD-10-CM | POA: Diagnosis not present

## 2019-12-15 DIAGNOSIS — I5022 Chronic systolic (congestive) heart failure: Secondary | ICD-10-CM

## 2019-12-15 DIAGNOSIS — M1712 Unilateral primary osteoarthritis, left knee: Secondary | ICD-10-CM | POA: Diagnosis not present

## 2019-12-15 DIAGNOSIS — I130 Hypertensive heart and chronic kidney disease with heart failure and stage 1 through stage 4 chronic kidney disease, or unspecified chronic kidney disease: Secondary | ICD-10-CM | POA: Diagnosis not present

## 2019-12-15 DIAGNOSIS — D631 Anemia in chronic kidney disease: Secondary | ICD-10-CM | POA: Diagnosis not present

## 2019-12-15 DIAGNOSIS — E785 Hyperlipidemia, unspecified: Secondary | ICD-10-CM | POA: Diagnosis not present

## 2019-12-15 DIAGNOSIS — R296 Repeated falls: Secondary | ICD-10-CM | POA: Diagnosis not present

## 2019-12-15 DIAGNOSIS — Z48812 Encounter for surgical aftercare following surgery on the circulatory system: Secondary | ICD-10-CM | POA: Diagnosis not present

## 2019-12-15 DIAGNOSIS — G4700 Insomnia, unspecified: Secondary | ICD-10-CM | POA: Diagnosis not present

## 2019-12-15 DIAGNOSIS — S20211D Contusion of right front wall of thorax, subsequent encounter: Secondary | ICD-10-CM

## 2019-12-15 DIAGNOSIS — I255 Ischemic cardiomyopathy: Secondary | ICD-10-CM | POA: Diagnosis not present

## 2019-12-15 DIAGNOSIS — I2510 Atherosclerotic heart disease of native coronary artery without angina pectoris: Secondary | ICD-10-CM | POA: Diagnosis not present

## 2019-12-15 DIAGNOSIS — M109 Gout, unspecified: Secondary | ICD-10-CM | POA: Diagnosis not present

## 2019-12-15 DIAGNOSIS — Z4801 Encounter for change or removal of surgical wound dressing: Secondary | ICD-10-CM | POA: Diagnosis not present

## 2019-12-15 DIAGNOSIS — N183 Chronic kidney disease, stage 3 unspecified: Secondary | ICD-10-CM | POA: Diagnosis not present

## 2019-12-15 DIAGNOSIS — I4891 Unspecified atrial fibrillation: Secondary | ICD-10-CM | POA: Diagnosis not present

## 2019-12-15 DIAGNOSIS — M6281 Muscle weakness (generalized): Secondary | ICD-10-CM | POA: Diagnosis not present

## 2019-12-15 DIAGNOSIS — Z8673 Personal history of transient ischemic attack (TIA), and cerebral infarction without residual deficits: Secondary | ICD-10-CM | POA: Diagnosis not present

## 2019-12-15 NOTE — Patient Instructions (Addendum)
Medication Instructions:  Your physician recommends that you continue on your current medications as directed. Please refer to the Current Medication list given to you today.  Labwork: None ordered.  Testing/Procedures: None ordered.  Follow-Up: Your physician wants you to follow-up in:  [place] will call you to schedule a 4 week appointment with device clinic.   Any Other Special Instructions Will Be Listed Below (If Applicable).  If you need a refill on your cardiac medications before your next appointment, please call your pharmacy.

## 2019-12-15 NOTE — Progress Notes (Signed)
HPI Mr. Tritto returns today for followup of his ICD wound. He is a pleasant 73 yo man with a h/o chronic systolic heart failure and s/p ICD insertion. He developed a large hematoma late after his gen change out, requiring evacuation, followed by non-healing. The patient ultimately had to have his system removed. He continues to be very slow to heal with a draining area. He denies any fever or chills. No infectious symptoms.   No Known Allergies   Current Outpatient Medications  Medication Sig Dispense Refill  . acetaminophen (TYLENOL) 325 MG tablet Take 650 mg by mouth every 6 (six) hours as needed for mild pain.    [name] amiodarone (PACERONE) 200 MG tablet Take 1 tablet by mouth once daily (Patient taking differently: Take 200 mg by mouth daily. ) 30 tablet 0  . carvedilol (COREG) 3.125 MG tablet Take 1 tablet (3.125 mg total) by mouth 2 (two) times daily with a meal. 60 tablet 1  . divalproex (DEPAKOTE ER) 500 MG 24 hr tablet Take 1 tablet every night (Patient taking differently: Take 500 mg by mouth at bedtime. ) 30 tablet 11  . DULoxetine (CYMBALTA) 30 MG capsule Take 1 capsule by mouth once daily 90 capsule 1  . feeding supplement, ENSURE ENLIVE, (ENSURE ENLIVE) LIQD Take 237 mLs by mouth 3 (three) times daily between meals. 237 mL 12  . ferrous sulfate 325 (65 FE) MG tablet Take 1 tablet (325 mg total) by mouth 2 (two) times daily with a meal. (Patient taking differently: Take 325 mg by mouth daily with breakfast. ) 60 tablet 0  . furosemide (LASIX) 40 MG tablet Take 1 tablet by mouth once daily 90 tablet 3  . levETIRAcetam (KEPPRA) 1000 MG tablet Take 1 tablet (1,000 mg total) by mouth 2 (two) times daily. 60 tablet 11  . phenytoin (DILANTIN) 100 MG ER capsule Take 1 capsule (100 mg total) by mouth 2 (two) times daily. 60 capsule 11  . polyethylene glycol (MIRALAX / GLYCOLAX) 17 g packet Take 17 g by mouth daily as needed for mild constipation or moderate constipation. 14 each 0  .  simvastatin (ZOCOR) 20 MG tablet TAKE 1 TABLET BY MOUTH ONCE DAILY AT 6PM (Patient taking differently: Take 20 mg by mouth daily at 6 PM. ) 90 tablet 1  . sulfamethoxazole-trimethoprim (BACTRIM DS) 800-160 MG tablet Take 1 tablet by mouth 2 (two) times daily. 10 tablet 0  . tamsulosin (FLOMAX) 0.4 MG CAPS capsule Take 1 capsule by mouth once daily (Patient taking differently: Take 0.4 mg by mouth daily. ) 90 capsule 0  . traMADol (ULTRAM) 50 MG tablet Take 1 tablet (50 mg total) by mouth every 6 (six) hours as needed for moderate pain. for pain 20 tablet 0   No current facility-administered medications for this visit.     Past Medical History:  Diagnosis Date  . AICD (automatic cardioverter/defibrillator) present   . Atrial fibrillation   09/22/2012  . Blood loss anemia 04/18/2017   After GI bleed from colonoscopy and polypectomy  . CAD (coronary artery disease)   . Chronic systolic heart failure ([place])   . 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 GIB (gastrointestinal bleeding) 02/20/2017  . History of MRSA infection 05/2011  . HTN (hypertension)   . Hx of adenomatous colonic polyps 02/20/2017   01/2017 - 3 cm sigmoid TV adenoma and other smaller polyps - had post-polypectomy bleed Tx w/ clips Consider  repeat colonoscopy 3 yrs [name], MD, [facility]   . Hyperlipidemia   . implantable cardiac defibrillator-Biotronik    Device Implanted 2006; s/p gen change 03/2011 : bleeding persistent with pocket erosion and infection; explant and reimplant  06/2011  . Ischemic cardiomyopathy    EF 15 to 20% by TTE and TEE in 09/2012.  Severe LV dysfunction  . Obesity    BMI 31 in 09/2012  . Persistent atrial fibrillation with rapid ventricular response ([place]) 04/21/2014  . Retroperitoneal bleed 04/21/2019  . Seizure disorder ([place]) latest 09/30/2012  . Seizures ([place])   . Stroke ([place])     ROS:   All systems reviewed and negative except as noted in the HPI.    Past Surgical History:  Procedure Laterality Date  . ALVEOLOPLASTY N/A 07/26/2019   Procedure: ALVEOLOPLASTY WITH RESUTURING OF ORAL WOUND;  Surgeon: [name], [name];  Location: [facility];  Service: Oral Surgery;  Laterality: N/A;  . CARDIAC CATHETERIZATION N/A 10/12/2015   Procedure: Right Heart Cath;  Surgeon: [name], MD;  Location: [place] [facility];  Service: Cardiovascular;  Laterality: N/A;  . CARDIOVERSION  09/24/2012   Procedure: CARDIOVERSION;  Surgeon: [name], MD;  Location: [facility] ENDOSCOPY;  Service: Cardiovascular;  Laterality: N/A;  . CARDIOVERSION N/A 12/25/2017   Procedure: CARDIOVERSION;  Surgeon: [name], MD;  Location: [facility];  Service: Cardiovascular;  Laterality: N/A;  . COLONOSCOPY W/ POLYPECTOMY  02/13/2017  . CORONARY ARTERY BYPASS GRAFT  2011   in [place] N/A 08/12/2019   Procedure: CAUTERIZATION OF ORAL BLEEDING;  Surgeon: [name], [name];  Location: [facility];  Service: Oral Surgery;  Laterality: N/A;  . FLEXIBLE SIGMOIDOSCOPY N/A 02/20/2017   Procedure: FLEXIBLE SIGMOIDOSCOPY;  Surgeon: [name], MD;  Location: [facility] ENDOSCOPY;  Service: Endoscopy;  Laterality: N/A;  . FLEXIBLE SIGMOIDOSCOPY N/A 02/21/2017   Procedure: FLEXIBLE SIGMOIDOSCOPY;  Surgeon: [name], MD;  Location: [facility] ENDOSCOPY;  Service: Endoscopy;  Laterality: N/A;  . ICD    . ICD GENERATOR CHANGEOUT N/A 09/10/2019   Procedure: [place];  Surgeon: [name], MD;  Location: [place] [facility];  Service: Cardiovascular;  Laterality: N/A;  . ICD LEAD REMOVAL Right 11/13/2019   Procedure: ICD EXTRACTION and Lead Extraction;  Surgeon: [name], MD;  Location: [place];  Service: Cardiovascular;  Laterality: Right;  Dr. Servando Snare for backup  . POCKET REVISION/RELOCATION N/A 09/17/2019   Procedure: POCKET REVISION/RELOCATION;  Surgeon: Thompson Grayer, MD;  Location: [place] [facility];  Service: Cardiovascular;  Laterality: N/A;  . SUBMANDIBULAR  GLAND EXCISION N/A 08/08/2019   Procedure: suture of oral wounds;  Surgeon: [name], [name];  Location: [facility];  Service: Oral Surgery;  Laterality: N/A;  . TEE WITHOUT CARDIOVERSION  09/24/2012   Procedure: TRANSESOPHAGEAL ECHOCARDIOGRAM (TEE);  Surgeon: [name], MD;  Location: [place];  Service: Cardiovascular;  Laterality: N/A;  dave/anesth, dl, cindy/echo   . TEE WITHOUT CARDIOVERSION N/A 11/12/2019   Procedure: TRANSESOPHAGEAL ECHOCARDIOGRAM (TEE);  Surgeon: [name], MD;  Location: [facility] ENDOSCOPY;  Service: Cardiovascular;  Laterality: N/A;  . TEE WITHOUT CARDIOVERSION N/A 11/13/2019   Procedure: TRANSESOPHAGEAL ECHOCARDIOGRAM (TEE);  Surgeon: [name], MD;  Location: [facility] OR;  Service: Cardiovascular;  Laterality: N/A;     Family History  Problem Relation Age of Onset  . Arthritis Mother   . Heart disease Mother   . Heart attack Mother   . Other  Father        smoker  . Hypertension Neg Hx        unknown  . Stroke Neg Hx        unknown     Social History   Socioeconomic History  . Marital status: Married    Spouse name: Not on file  . Number of children: Not on file  . Years of education: Not on file  . Highest education level: Not on file  Occupational History  . Not on file  Tobacco Use  . Smoking status: Former Smoker    Types: Pipe    Quit date: 09/21/2008    Years since quitting: 11.2  . Smokeless tobacco: Former Systems developer    Quit date: 09/21/2008  Substance and Sexual Activity  . Alcohol use: No  . Drug use: No  . Sexual activity: Not Currently    Birth control/protection: None  Other Topics Concern  . Not on file  Social History Narrative   ** Merged History Encounter **       Social Determinants of Health   Financial Resource Strain:   . Difficulty of Paying Living Expenses: Not on file  Food Insecurity:   . Worried About Charity fundraiser in the Last Year: Not on file  . Ran Out of Food in the Last Year: Not on file   Transportation Needs:   . Lack of Transportation (Medical): Not on file  . Lack of Transportation (Non-Medical): Not on file  Physical Activity:   . Days of Exercise per Week: Not on file  . Minutes of Exercise per Session: Not on file  Stress:   . Feeling of Stress : Not on file  Social Connections:   . Frequency of Communication with Friends and Family: Not on file  . Frequency of Social Gatherings with Friends and Family: Not on file  . Attends Religious Services: Not on file  . Active Member of Clubs or Organizations: Not on file  . Attends Archivist Meetings: Not on file  . Marital Status: Not on file  Intimate Partner Violence:   . Fear of Current or Ex-Partner: Not on file  . Emotionally Abused: Not on file  . Physically Abused: Not on file  . Sexually Abused: Not on file     BP 116/76   Pulse 85   Ht 5\' 11"  (1.803 m)   Wt 188 lb (85.3 kg)   SpO2 97%   BMI 26.22 kg/m   Physical Exam:  Well appearing NAD HEENT: Unremarkable Neck:  No JVD, no thyromegally Lymphatics:  No adenopathy Back:  No CVA tenderness Lungs:  Clear with no wheezes; his right chest incision has a 5 mm area of drainage which I probed. There was a small amount of purulent sanguinous fluid expressed. HEART:  Regular rate rhythm, no murmurs, no rubs, no clicks Abd:  soft, positive bowel sounds, no organomegally, no rebound, no guarding Ext:  2 plus pulses, no edema, no cyanosis, no clubbing Skin:  No rashes no nodules Neuro:  CN II through XII intact, motor grossly intact   Assess/Plan: 1. ICD wound - his incision remains very slow to heal. He has no systemic symptoms. He will continue to wash the area with hot soapy water twice daily. If it not improving I will send him to see one of the general surgeons.  2. Chronic systolic heart failure - his symptoms are class 2. He will continue his current medical therapy.  Carleene Overlie  ,M.D.

## 2019-12-16 DIAGNOSIS — I2510 Atherosclerotic heart disease of native coronary artery without angina pectoris: Secondary | ICD-10-CM | POA: Diagnosis not present

## 2019-12-16 DIAGNOSIS — Z48812 Encounter for surgical aftercare following surgery on the circulatory system: Secondary | ICD-10-CM | POA: Diagnosis not present

## 2019-12-16 DIAGNOSIS — M6281 Muscle weakness (generalized): Secondary | ICD-10-CM | POA: Diagnosis not present

## 2019-12-16 DIAGNOSIS — M109 Gout, unspecified: Secondary | ICD-10-CM | POA: Diagnosis not present

## 2019-12-16 DIAGNOSIS — I5022 Chronic systolic (congestive) heart failure: Secondary | ICD-10-CM | POA: Diagnosis not present

## 2019-12-16 DIAGNOSIS — I4891 Unspecified atrial fibrillation: Secondary | ICD-10-CM | POA: Diagnosis not present

## 2019-12-16 DIAGNOSIS — D689 Coagulation defect, unspecified: Secondary | ICD-10-CM | POA: Diagnosis not present

## 2019-12-16 DIAGNOSIS — I130 Hypertensive heart and chronic kidney disease with heart failure and stage 1 through stage 4 chronic kidney disease, or unspecified chronic kidney disease: Secondary | ICD-10-CM | POA: Diagnosis not present

## 2019-12-16 DIAGNOSIS — E785 Hyperlipidemia, unspecified: Secondary | ICD-10-CM | POA: Diagnosis not present

## 2019-12-16 DIAGNOSIS — I255 Ischemic cardiomyopathy: Secondary | ICD-10-CM | POA: Diagnosis not present

## 2019-12-16 DIAGNOSIS — Z4801 Encounter for change or removal of surgical wound dressing: Secondary | ICD-10-CM | POA: Diagnosis not present

## 2019-12-16 DIAGNOSIS — N183 Chronic kidney disease, stage 3 unspecified: Secondary | ICD-10-CM | POA: Diagnosis not present

## 2019-12-16 DIAGNOSIS — G4700 Insomnia, unspecified: Secondary | ICD-10-CM | POA: Diagnosis not present

## 2019-12-16 DIAGNOSIS — Z8673 Personal history of transient ischemic attack (TIA), and cerebral infarction without residual deficits: Secondary | ICD-10-CM | POA: Diagnosis not present

## 2019-12-16 DIAGNOSIS — R296 Repeated falls: Secondary | ICD-10-CM | POA: Diagnosis not present

## 2019-12-16 DIAGNOSIS — D631 Anemia in chronic kidney disease: Secondary | ICD-10-CM | POA: Diagnosis not present

## 2019-12-16 DIAGNOSIS — M1712 Unilateral primary osteoarthritis, left knee: Secondary | ICD-10-CM | POA: Diagnosis not present

## 2019-12-17 DIAGNOSIS — M1712 Unilateral primary osteoarthritis, left knee: Secondary | ICD-10-CM | POA: Diagnosis not present

## 2019-12-17 DIAGNOSIS — M109 Gout, unspecified: Secondary | ICD-10-CM | POA: Diagnosis not present

## 2019-12-17 DIAGNOSIS — D631 Anemia in chronic kidney disease: Secondary | ICD-10-CM | POA: Diagnosis not present

## 2019-12-17 DIAGNOSIS — M6281 Muscle weakness (generalized): Secondary | ICD-10-CM | POA: Diagnosis not present

## 2019-12-17 DIAGNOSIS — Z4801 Encounter for change or removal of surgical wound dressing: Secondary | ICD-10-CM | POA: Diagnosis not present

## 2019-12-17 DIAGNOSIS — Z48812 Encounter for surgical aftercare following surgery on the circulatory system: Secondary | ICD-10-CM | POA: Diagnosis not present

## 2019-12-17 DIAGNOSIS — N183 Chronic kidney disease, stage 3 unspecified: Secondary | ICD-10-CM | POA: Diagnosis not present

## 2019-12-17 DIAGNOSIS — I2510 Atherosclerotic heart disease of native coronary artery without angina pectoris: Secondary | ICD-10-CM | POA: Diagnosis not present

## 2019-12-17 DIAGNOSIS — R296 Repeated falls: Secondary | ICD-10-CM | POA: Diagnosis not present

## 2019-12-17 DIAGNOSIS — I255 Ischemic cardiomyopathy: Secondary | ICD-10-CM | POA: Diagnosis not present

## 2019-12-17 DIAGNOSIS — G4700 Insomnia, unspecified: Secondary | ICD-10-CM | POA: Diagnosis not present

## 2019-12-17 DIAGNOSIS — D689 Coagulation defect, unspecified: Secondary | ICD-10-CM | POA: Diagnosis not present

## 2019-12-17 DIAGNOSIS — E785 Hyperlipidemia, unspecified: Secondary | ICD-10-CM | POA: Diagnosis not present

## 2019-12-17 DIAGNOSIS — I5022 Chronic systolic (congestive) heart failure: Secondary | ICD-10-CM | POA: Diagnosis not present

## 2019-12-17 DIAGNOSIS — I130 Hypertensive heart and chronic kidney disease with heart failure and stage 1 through stage 4 chronic kidney disease, or unspecified chronic kidney disease: Secondary | ICD-10-CM | POA: Diagnosis not present

## 2019-12-17 DIAGNOSIS — I4891 Unspecified atrial fibrillation: Secondary | ICD-10-CM | POA: Diagnosis not present

## 2019-12-17 DIAGNOSIS — Z8673 Personal history of transient ischemic attack (TIA), and cerebral infarction without residual deficits: Secondary | ICD-10-CM | POA: Diagnosis not present

## 2019-12-21 ENCOUNTER — Other Ambulatory Visit: Payer: Self-pay

## 2019-12-21 ENCOUNTER — Emergency Department (HOSPITAL_COMMUNITY)
Admission: EM | Admit: 2019-12-21 | Discharge: 2019-12-22 | Disposition: A | Discharge status: Home / Self Care | Payer: Medicare Other | Attending: Emergency Medicine | Admitting: Emergency Medicine

## 2019-12-21 ENCOUNTER — Ambulatory Visit: Payer: Self-pay

## 2019-12-21 ENCOUNTER — Encounter (HOSPITAL_COMMUNITY): Payer: Self-pay

## 2019-12-21 DIAGNOSIS — Z79899 Other long term (current) drug therapy: Secondary | ICD-10-CM | POA: Diagnosis not present

## 2019-12-21 DIAGNOSIS — N183 Chronic kidney disease, stage 3 unspecified: Secondary | ICD-10-CM | POA: Insufficient documentation

## 2019-12-21 DIAGNOSIS — I2510 Atherosclerotic heart disease of native coronary artery without angina pectoris: Secondary | ICD-10-CM | POA: Insufficient documentation

## 2019-12-21 DIAGNOSIS — E785 Hyperlipidemia, unspecified: Secondary | ICD-10-CM | POA: Diagnosis not present

## 2019-12-21 DIAGNOSIS — R319 Hematuria, unspecified: Secondary | ICD-10-CM | POA: Diagnosis present

## 2019-12-21 DIAGNOSIS — Z95811 Presence of heart assist device: Secondary | ICD-10-CM | POA: Diagnosis not present

## 2019-12-21 DIAGNOSIS — M109 Gout, unspecified: Secondary | ICD-10-CM | POA: Diagnosis not present

## 2019-12-21 DIAGNOSIS — Z8669 Personal history of other diseases of the nervous system and sense organs: Secondary | ICD-10-CM | POA: Insufficient documentation

## 2019-12-21 DIAGNOSIS — Z4801 Encounter for change or removal of surgical wound dressing: Secondary | ICD-10-CM | POA: Diagnosis not present

## 2019-12-21 DIAGNOSIS — R310 Gross hematuria: Secondary | ICD-10-CM | POA: Diagnosis not present

## 2019-12-21 DIAGNOSIS — M1712 Unilateral primary osteoarthritis, left knee: Secondary | ICD-10-CM | POA: Diagnosis not present

## 2019-12-21 DIAGNOSIS — I255 Ischemic cardiomyopathy: Secondary | ICD-10-CM | POA: Diagnosis not present

## 2019-12-21 DIAGNOSIS — I5022 Chronic systolic (congestive) heart failure: Secondary | ICD-10-CM | POA: Insufficient documentation

## 2019-12-21 DIAGNOSIS — G4700 Insomnia, unspecified: Secondary | ICD-10-CM | POA: Diagnosis not present

## 2019-12-21 DIAGNOSIS — Z8673 Personal history of transient ischemic attack (TIA), and cerebral infarction without residual deficits: Secondary | ICD-10-CM | POA: Diagnosis not present

## 2019-12-21 DIAGNOSIS — D689 Coagulation defect, unspecified: Secondary | ICD-10-CM | POA: Diagnosis not present

## 2019-12-21 DIAGNOSIS — I130 Hypertensive heart and chronic kidney disease with heart failure and stage 1 through stage 4 chronic kidney disease, or unspecified chronic kidney disease: Secondary | ICD-10-CM | POA: Diagnosis not present

## 2019-12-21 DIAGNOSIS — Z48812 Encounter for surgical aftercare following surgery on the circulatory system: Secondary | ICD-10-CM | POA: Diagnosis not present

## 2019-12-21 DIAGNOSIS — Z87891 Personal history of nicotine dependence: Secondary | ICD-10-CM | POA: Insufficient documentation

## 2019-12-21 DIAGNOSIS — I4891 Unspecified atrial fibrillation: Secondary | ICD-10-CM | POA: Diagnosis not present

## 2019-12-21 DIAGNOSIS — D631 Anemia in chronic kidney disease: Secondary | ICD-10-CM | POA: Diagnosis not present

## 2019-12-21 DIAGNOSIS — M6281 Muscle weakness (generalized): Secondary | ICD-10-CM | POA: Diagnosis not present

## 2019-12-21 DIAGNOSIS — R296 Repeated falls: Secondary | ICD-10-CM | POA: Diagnosis not present

## 2019-12-21 LAB — COMPREHENSIVE METABOLIC PANEL
ALT: 20 U/L (ref 0–44)
AST: 25 U/L (ref 15–41)
Albumin: 2.9 g/dL — ABNORMAL LOW (ref 3.5–5.0)
Alkaline Phosphatase: 103 U/L (ref 38–126)
Anion gap: 2 — ABNORMAL LOW (ref 5–15)
BUN: 34 mg/dL — ABNORMAL HIGH (ref 8–23)
CO2: 26 mmol/L (ref 22–32)
Calcium: 8.7 mg/dL — ABNORMAL LOW (ref 8.9–10.3)
Chloride: 103 mmol/L (ref 98–111)
Creatinine, Ser: 1.95 mg/dL — ABNORMAL HIGH (ref 0.61–1.24)
GFR calc Af Amer: 39 mL/min — ABNORMAL LOW (ref >60)
GFR calc non Af Amer: 34 mL/min — ABNORMAL LOW (ref >60)
Glucose, Bld: 100 mg/dL — ABNORMAL HIGH (ref 70–99)
Potassium: 4.7 mmol/L (ref 3.5–5.1)
Sodium: 131 mmol/L — ABNORMAL LOW (ref 135–145)
Total Bilirubin: 0.5 mg/dL (ref 0.3–1.2)
Total Protein: 10.2 g/dL — ABNORMAL HIGH (ref 6.5–8.1)

## 2019-12-21 LAB — CBC
HCT: 33.8 % — ABNORMAL LOW (ref 39.0–52.0)
Hemoglobin: 10.6 g/dL — ABNORMAL LOW (ref 13.0–17.0)
MCH: 29.9 pg (ref 26.0–34.0)
MCHC: 31.4 g/dL (ref 30.0–36.0)
MCV: 95.2 fL (ref 80.0–100.0)
Platelets: 227 10*3/uL (ref 150–400)
RBC: 3.55 MIL/uL — ABNORMAL LOW (ref 4.22–5.81)
RDW: 15.1 % (ref 11.5–15.5)
WBC: 3.9 10*3/uL — ABNORMAL LOW (ref 4.0–10.5)
nRBC: 0 % (ref 0.0–0.2)

## 2019-12-21 LAB — LIPASE, BLOOD: Lipase: 45 U/L (ref 11–51)

## 2019-12-21 NOTE — ED Triage Notes (Signed)
Pt reports bright red blood in his urine for the past week. Pt seen by PCP and given abx (bactrim) in which he has now finished but pt still having a lot of blood in his urine. Pt denies abd pain, flank pain or back pain. Denies n/v.

## 2019-12-21 NOTE — Telephone Encounter (Signed)
Called pt, LVM.   

## 2019-12-21 NOTE — Telephone Encounter (Signed)
Called patient and spoke with Louis Ford on DPR. [name] is present.  She states that over 10 days ago he was treated with antibiotic for blood in his urine and UTI.  Today he continues to bleed.  She states that he is a free bleeder and had appointment for blood work at oncology office tomorrow. She states his urine is red and has no clots. He is voiding normal amount and had no pain with urination. No back or flank pain.  He is SOB with activity which she states is new for him. Per protocol patient was asked to go to ER. He refuses. Call placed to office. He again with advice to go to ER from office refuses. Ms Tamala Julian states that his [facility] nurse is due to visit soon.  She states that she will try to get him go to go to ER with [facility] nurse help. Note will be routed to office.  Reason for Disposition . Patient sounds very sick or weak to the triager  Answer Assessment - Initial Assessment Questions 1. COLOR of URINE: "Describe the color of the urine."  (e.g., tea-colored, pink, red, blood clots, bloody)     Red no clots 2. ONSET: "When did the bleeding start?"      Didn't stop after last visit 3. EPISODES: "How many times has there been blood in the urine?" or "How many times today?"     Every void 4. PAIN with URINATION: "Is there any pain with passing your urine?" If so, ask: "How bad is the pain?"  (Scale 1-10; or mild, moderate, severe)    - MILD - complains slightly about urination hurting    - MODERATE - interferes with normal activities      - SEVERE - excruciating, unwilling or unable to urinate because of the pain     no 5. FEVER: "Do you have a fever?" If so, ask: "What is your temperature, how was it measured, and when did it start?"     no 6. ASSOCIATED SYMPTOMS: "Are you passing urine more frequently than usual?"     normal 7. OTHER SYMPTOMS: "Do you have any other symptoms?" (e.g., back/flank pain, abdominal pain, vomiting)     no 8. PREGNANCY: "Is there any chance you are  pregnant?" "When was your last menstrual period?"    N/A  Protocols used: URINE - BLOOD IN-A-AH

## 2019-12-21 NOTE — Telephone Encounter (Signed)
It sounds like at the end of message they agree to try to go to ER. I would recommend ER given new SOB.

## 2019-12-22 ENCOUNTER — Other Ambulatory Visit: Payer: Medicare Other

## 2019-12-22 ENCOUNTER — Telehealth: Payer: Self-pay | Admitting: Hematology

## 2019-12-22 LAB — URINALYSIS, ROUTINE W REFLEX MICROSCOPIC
Bacteria, UA: NONE SEEN
RBC / HPF: >50 RBC/hpf — ABNORMAL HIGH (ref 0–5)

## 2019-12-22 NOTE — ED Provider Notes (Signed)
[facility] EMERGENCY DEPARTMENT Provider Note  CSN: [id number] Arrival date & time: 12/21/19 1629  Chief Complaint(s) Hematuria  HPI [facility] [place]. is a 73 y.o. male   The history is provided by the patient.  Hematuria This is a new problem. Episode onset: 10 days. The problem occurs constantly. The problem has not changed since onset.Pertinent negatives include no chest pain, no abdominal pain and no headaches. Nothing aggravates the symptoms. Nothing relieves the symptoms. Treatments tried: Antibiotic. The treatment provided no relief.   Patient seen by his PCP initially and treated for urinary tract infection.  Patient already has an appointment with urology later on today.  Past Medical History Past Medical History:  Diagnosis Date  . AICD (automatic cardioverter/defibrillator) present   . Atrial fibrillation   09/22/2012  . Blood loss anemia 04/18/2017   After GI bleed from colonoscopy and polypectomy  . CAD (coronary artery disease)   . Chronic systolic heart failure ([place])   . 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 GIB (gastrointestinal bleeding) 02/20/2017  . History of MRSA infection 05/2011  . HTN (hypertension)   . Hx of adenomatous colonic polyps 02/20/2017   01/2017 - 3 cm sigmoid TV adenoma and other smaller polyps - had post-polypectomy bleed Tx w/ clips Consider repeat colonoscopy 3 yrs [name], MD, [name]   . Hyperlipidemia   . implantable cardiac defibrillator-Biotronik    Device Implanted 2006; s/p gen change 03/2011 : bleeding persistent with pocket erosion and infection; explant and reimplant  06/2011  . Ischemic cardiomyopathy    EF 15 to 20% by TTE and TEE in 09/2012.  Severe LV dysfunction  . Obesity    BMI 31 in 09/2012  . Persistent atrial fibrillation with rapid ventricular response ([place]) 04/21/2014  . Retroperitoneal bleed 04/21/2019  . Seizure disorder ([place]) latest 09/30/2012  .  Seizures ([place])   . Stroke [facility])    Patient Active Problem List   Diagnosis Date Noted  . Gross hematuria 12/11/2019  . ICD (implantable cardioverter-defibrillator) pocket hematoma, subsequent encounter 11/09/2019  . Hematoma 10/29/2019  . Upper back pain 10/29/2019  . Nonischemic cardiomyopathy ([place]) 10/22/2019  . ICD (implantable cardioverter-defibrillator) pocket hematoma 09/17/2019  . Complications due to automatic implantable cardioverter-defibrillator   . Hospice care patient 08/05/2019  . Palliative care patient 08/05/2019  . Oral bleeding   . Coagulopathy ([place])   . Anemia due to blood loss, acute 07/20/2019  . Retroperitoneal bleed 04/21/2019  . Retroperitoneal hematoma   . Right arm pain 11/26/2018  . Hematoma of chest wall 03/26/2018  . Leg pain 03/04/2018  . 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 Elevated TSH 10/27/2017  . Weakness of both lower extremities 10/27/2017  . Unsteady gait 10/27/2017  . Localization-related symptomatic epilepsy and epileptic syndromes with complex partial seizures, not intractable, without status epilepticus ([place]) 10/08/2017  . Insomnia 09/02/2017  . Degenerative joint disease of knee, left 08/01/2017  . Blood loss anemia 04/18/2017  . Hx of adenomatous colonic polyps 02/20/2017  . Chronic pain of left knee 01/22/2017  . Chronic kidney disease (CKD), stage III (moderate) ([place]) 11/08/2016  . Hypertensive heart disease without heart failure   . Chronic chest wall pain 08/17/2016  . Periodontal disease 05/18/2016  . HLD (hyperlipidemia) 02/26/2016  . CVA (cerebral infarction) 02/26/2016  . Near syncope 02/26/2016  . Gout 12/30/2015  . Persistent atrial fibrillation with rapid ventricular response ([place]) 04/21/2014  . Chronic systolic CHF (  congestive heart failure), NYHA class 2 ([place]) 11/06/2012  . 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 Seizure ([place]) 10/01/2012  . Renal insufficiency 10/01/2012  . Ischemic cardiomyopathy  ? additional  rate component 09/29/2012  . Automatic implantable cardioverter-defibrillator in situ   . HTN (hypertension) 09/21/2012  . Coronary artery disease involving native heart 09/21/2012   Home Medication(s) Prior to Admission medications   Medication Sig Start Date End Date Taking? Authorizing Provider  acetaminophen (TYLENOL) 325 MG tablet Take 650 mg by mouth every 6 (six) hours as needed for mild pain.    [provider]  amiodarone (PACERONE) 200 MG tablet Take 1 tablet by mouth once daily Patient taking differently: Take 200 mg by mouth daily.  05/21/19   [name], [name], MD  carvedilol (COREG) 3.125 MG tablet Take 1 tablet (3.125 mg total) by mouth 2 (two) times daily with a meal. 08/16/19 10/15/19  Marylyn Ishihara, Tyrone A, DO  divalproex (DEPAKOTE ER) 500 MG 24 hr tablet Take 1 tablet every night Patient taking differently: Take 500 mg by mouth at bedtime.  01/06/19   [name], MD  DULoxetine (CYMBALTA) 30 MG capsule Take 1 capsule by mouth once daily 10/13/19   [name], MD  feeding supplement, ENSURE ENLIVE, (ENSURE ENLIVE) LIQD Take 237 mLs by mouth 3 (three) times daily between meals. 11/20/19   Daune Perch, NP  ferrous sulfate 325 (65 FE) MG tablet Take 1 tablet (325 mg total) by mouth 2 (two) times daily with a meal. Patient taking differently: Take 325 mg by mouth daily with breakfast.  08/16/19 09/28/19  Cherylann Ratel A, DO  furosemide (LASIX) 40 MG tablet Take 1 tablet by mouth once daily 12/03/19   [name], MD  levETIRAcetam (KEPPRA) 1000 MG tablet Take 1 tablet (1,000 mg total) by mouth 2 (two) times daily. 01/06/19   [name], MD  phenytoin (DILANTIN) 100 MG ER capsule Take 1 capsule (100 mg total) by mouth 2 (two) times daily. 01/06/19   [name], MD  polyethylene glycol (MIRALAX / GLYCOLAX) 17 g packet Take 17 g by mouth daily as needed for mild constipation or moderate constipation. 11/20/19   Daune Perch, NP  simvastatin (ZOCOR) 20  MG tablet TAKE 1 TABLET BY MOUTH ONCE DAILY AT 6PM Patient taking differently: Take 20 mg by mouth daily at 6 PM.  06/19/19   [name], MD  sulfamethoxazole-trimethoprim (BACTRIM DS) 800-160 MG tablet Take 1 tablet by mouth 2 (two) times daily. 12/11/19   [name], MD  tamsulosin (FLOMAX) 0.4 MG CAPS capsule Take 1 capsule by mouth once daily Patient taking differently: Take 0.4 mg by mouth daily.  07/17/19   Marrian Salvage, FNP  traMADol (ULTRAM) 50 MG tablet Take 1 tablet (50 mg total) by mouth every 6 (six) hours as needed for moderate pain. for pain 10/29/19   [name], MD  Past Surgical History Past Surgical History:  Procedure Laterality Date  . ALVEOLOPLASTY N/A 07/26/2019   Procedure: ALVEOLOPLASTY WITH RESUTURING OF ORAL WOUND;  Surgeon: [name], [name];  Location: [facility];  Service: Oral Surgery;  Laterality: N/A;  . CARDIAC CATHETERIZATION N/A 10/12/2015   Procedure: Right Heart Cath;  Surgeon: [name], MD;  Location: [place] CV LAB;  Service: Cardiovascular;  Laterality: N/A;  . CARDIOVERSION  09/24/2012   Procedure: CARDIOVERSION;  Surgeon: [name], MD;  Location: [facility] ENDOSCOPY;  Service: Cardiovascular;  Laterality: N/A;  . CARDIOVERSION N/A 12/25/2017   Procedure: CARDIOVERSION;  Surgeon: [name], MD;  Location: [facility]S;  Service: Cardiovascular;  Laterality: N/A;  . COLONOSCOPY W/ POLYPECTOMY  02/13/2017  . CORONARY ARTERY BYPASS GRAFT  2011   in [place] N/A 08/12/2019   Procedure: CAUTERIZATION OF ORAL BLEEDING;  Surgeon: [name], [name];  Location: [facility];  Service: Oral Surgery;  Laterality: N/A;  . FLEXIBLE SIGMOIDOSCOPY N/A 02/20/2017   Procedure: FLEXIBLE SIGMOIDOSCOPY;  Surgeon: [name], MD;  Location: [facility] ENDOSCOPY;  Service: Endoscopy;  Laterality: N/A;    . FLEXIBLE SIGMOIDOSCOPY N/A 02/21/2017   Procedure: FLEXIBLE SIGMOIDOSCOPY;  Surgeon: [name], MD;  Location: [facility] ENDOSCOPY;  Service: Endoscopy;  Laterality: N/A;  . ICD    . ICD GENERATOR CHANGEOUT N/A 09/10/2019   Procedure: [place];  Surgeon: [name], MD;  Location: [place] CV LAB;  Service: Cardiovascular;  Laterality: N/A;  . ICD LEAD REMOVAL Right 11/13/2019   Procedure: ICD EXTRACTION and Lead Extraction;  Surgeon: [name], MD;  Location: [place];  Service: Cardiovascular;  Laterality: Right;  Dr. Servando Snare for backup  . POCKET REVISION/RELOCATION N/A 09/17/2019   Procedure: POCKET REVISION/RELOCATION;  Surgeon: Thompson Grayer, MD;  Location: [place] CV LAB;  Service: Cardiovascular;  Laterality: N/A;  . SUBMANDIBULAR GLAND EXCISION N/A 08/08/2019   Procedure: suture of oral wounds;  Surgeon: [name], [name];  Location: [facility];  Service: Oral Surgery;  Laterality: N/A;  . TEE WITHOUT CARDIOVERSION  09/24/2012   Procedure: TRANSESOPHAGEAL ECHOCARDIOGRAM (TEE);  Surgeon: [name], MD;  Location: [place];  Service: Cardiovascular;  Laterality: N/A;  dave/anesth, dl, cindy/echo   . TEE WITHOUT CARDIOVERSION N/A 11/12/2019   Procedure: TRANSESOPHAGEAL ECHOCARDIOGRAM (TEE);  Surgeon: [name], MD;  Location: [facility] ENDOSCOPY;  Service: Cardiovascular;  Laterality: N/A;  . TEE WITHOUT CARDIOVERSION N/A 11/13/2019   Procedure: TRANSESOPHAGEAL ECHOCARDIOGRAM (TEE);  Surgeon: [name], MD;  Location: [facility] OR;  Service: Cardiovascular;  Laterality: N/A;   Family History Family History  Problem Relation Age of Onset  . Arthritis Mother   . Heart disease Mother   . Heart attack Mother   . Other Father        smoker  . Hypertension Neg Hx        unknown  . Stroke Neg Hx        unknown    Social History Social History   Tobacco Use  . Smoking status: Former Smoker    Types: Pipe    Quit date: 09/21/2008    Years since  quitting: 11.2  . Smokeless tobacco: Former Systems developer    Quit date: 09/21/2008  Substance Use Topics  . Alcohol use: No  . Drug use: No   Allergies Patient has no known allergies.  Review of Systems Review of Systems  Cardiovascular: Negative for chest pain.  Gastrointestinal: Negative for  abdominal pain.  Genitourinary: Positive for hematuria.  Neurological: Negative for headaches.   All other systems are reviewed and are negative for acute change except as noted in the HPI  Physical Exam Vital Signs  I have reviewed the triage vital signs BP 96/71 (BP Location: Right Arm)   Pulse 88   Temp 98.5 F (36.9 C)   Resp 18   SpO2 100%   Physical Exam Vitals reviewed.  Constitutional:      General: He is not in acute distress.    Appearance: He is well-developed. He is not diaphoretic.  HENT:     Head: Normocephalic and atraumatic.     Jaw: No trismus.     Right Ear: External ear normal.     Left Ear: External ear normal.     Nose: Nose normal.  Eyes:     General: No scleral icterus.    Conjunctiva/sclera: Conjunctivae normal.  Neck:     Trachea: Phonation normal.  Cardiovascular:     Rate and Rhythm: Normal rate and regular rhythm.  Pulmonary:     Effort: Pulmonary effort is normal. No respiratory distress.     Breath sounds: No stridor.  Abdominal:     General: There is no distension.  Genitourinary:    Penis: Circumcised. No tenderness, discharge, swelling or lesions.      Testes: Normal.        Right: Tenderness or swelling not present.        Left: Tenderness or swelling not present.    Musculoskeletal:        General: Normal range of motion.     Cervical back: Normal range of motion.  Neurological:     Mental Status: He is alert and oriented to person, place, and time.  Psychiatric:        Behavior: Behavior normal.     ED Results and Treatments Labs (all labs ordered are listed, but only abnormal results are displayed) Labs Reviewed  URINALYSIS,  ROUTINE W REFLEX MICROSCOPIC - Abnormal; Notable for the following components:      Result Value   Color, Urine RED (*)    APPearance TURBID (*)    Glucose, UA   (*)    Value: TEST NOT REPORTED DUE TO COLOR INTERFERENCE OF URINE PIGMENT   Hgb urine dipstick   (*)    Value: TEST NOT REPORTED DUE TO COLOR INTERFERENCE OF URINE PIGMENT   Bilirubin Urine   (*)    Value: TEST NOT REPORTED DUE TO COLOR INTERFERENCE OF URINE PIGMENT   Ketones, ur   (*)    Value: TEST NOT REPORTED DUE TO COLOR INTERFERENCE OF URINE PIGMENT   Protein, ur   (*)    Value: TEST NOT REPORTED DUE TO COLOR INTERFERENCE OF URINE PIGMENT   Nitrite   (*)    Value: TEST NOT REPORTED DUE TO COLOR INTERFERENCE OF URINE PIGMENT   Leukocytes,Ua   (*)    Value: TEST NOT REPORTED DUE TO COLOR INTERFERENCE OF URINE PIGMENT   RBC / HPF >50 (*)    All other components within normal limits  CBC - Abnormal; Notable for the following components:   WBC 3.9 (*)    RBC 3.55 (*)    Hemoglobin 10.6 (*)    HCT 33.8 (*)    All other components within normal limits  COMPREHENSIVE METABOLIC PANEL - Abnormal; Notable for the following components:   Sodium 131 (*)    Glucose, Bld 100 (*)  BUN 34 (*)    Creatinine, Ser 1.95 (*)    Calcium 8.7 (*)    Total Protein 10.2 (*)    Albumin 2.9 (*)    GFR calc non Af Amer 34 (*)    GFR calc Af Amer 39 (*)    Anion gap 2 (*)    All other components within normal limits  LIPASE, BLOOD                                                                                                                         EKG  EKG Interpretation  Date/Time:    Ventricular Rate:    PR Interval:    QRS Duration:   QT Interval:    QTC Calculation:   R Axis:     Text Interpretation:        Radiology No results found.  Pertinent labs & imaging results that were available during my care of the patient were reviewed by me and considered in my medical decision making (see chart for  details).  Medications Ordered in ED Medications - No data to display                                                                                                                                  Procedures Procedures  (including critical care time)  Medical Decision Making / ED Course I have reviewed the nursing notes for this encounter and the patient's prior records (if available in EHR or on provided paperwork).   [facility]. was evaluated in Emergency Department on 12/22/2019 for the symptoms described in the history of present illness. He was evaluated in the context of the global COVID-19 pandemic, which necessitated consideration that the patient might be at risk for infection with the SARS-CoV-2 virus that causes COVID-19. Institutional protocols and algorithms that pertain to the evaluation of patients at risk for COVID-19 are in a state of rapid change based on information released by regulatory bodies including the CDC and federal and state organizations. These policies and algorithms were followed during the patient's care in the ED.  Gross hematuria, contaminated urine.  No no bacteria found.  Mild decrease in his hemoglobin but similar to prior.  Mild renal insufficiency.  Patient does not have any signs of urinary retention that will require Foley catheter.  Feel patient is stable for continued outpatient management.  Instructed to keep his  urology appointment for later on this day.  The patient appears reasonably screened and/or stabilized for discharge and I doubt any other medical condition or other [facility] requiring further screening, evaluation, or treatment in the ED at this time prior to discharge.  The patient is safe for discharge with strict return precautions.       Final Clinical Impression(s) / ED Diagnoses Final diagnoses:  Gross hematuria    The patient appears reasonably screened and/or stabilized for discharge and I doubt any other medical  condition or other [facility] requiring further screening, evaluation, or treatment in the ED at this time prior to discharge.  Disposition: Discharge  Condition: Good  I have discussed the results, Dx and Tx plan with the patient who expressed understanding and agree(s) with the plan. Discharge instructions discussed at great length. The patient was given strict return precautions who verbalized understanding of the instructions. No further questions at time of discharge.    ED Discharge Orders    None       Follow Up: Urology  On 12/22/2019 Please keep your appointment for later this day.      This chart was dictated using voice recognition software.  Despite best efforts to proofread,  errors can occur which can change the documentation meaning.   Fatima Blank, MD 12/22/19 [phone]

## 2019-12-22 NOTE — Telephone Encounter (Signed)
Returned patient's phone call regarding rescheduling an appointment, left a voicemail. 

## 2019-12-23 ENCOUNTER — Telehealth: Payer: Self-pay | Admitting: Hematology

## 2019-12-23 NOTE — Telephone Encounter (Signed)
Returned patient's phone call regarding rescheduling an appointment, left a voicemail. 

## 2019-12-24 ENCOUNTER — Telehealth: Payer: Self-pay

## 2019-12-24 DIAGNOSIS — N183 Chronic kidney disease, stage 3 unspecified: Secondary | ICD-10-CM | POA: Diagnosis not present

## 2019-12-24 DIAGNOSIS — I5022 Chronic systolic (congestive) heart failure: Secondary | ICD-10-CM | POA: Diagnosis not present

## 2019-12-24 DIAGNOSIS — M1712 Unilateral primary osteoarthritis, left knee: Secondary | ICD-10-CM | POA: Diagnosis not present

## 2019-12-24 DIAGNOSIS — I255 Ischemic cardiomyopathy: Secondary | ICD-10-CM | POA: Diagnosis not present

## 2019-12-24 DIAGNOSIS — Z8673 Personal history of transient ischemic attack (TIA), and cerebral infarction without residual deficits: Secondary | ICD-10-CM | POA: Diagnosis not present

## 2019-12-24 DIAGNOSIS — I2510 Atherosclerotic heart disease of native coronary artery without angina pectoris: Secondary | ICD-10-CM | POA: Diagnosis not present

## 2019-12-24 DIAGNOSIS — D631 Anemia in chronic kidney disease: Secondary | ICD-10-CM | POA: Diagnosis not present

## 2019-12-24 DIAGNOSIS — I130 Hypertensive heart and chronic kidney disease with heart failure and stage 1 through stage 4 chronic kidney disease, or unspecified chronic kidney disease: Secondary | ICD-10-CM | POA: Diagnosis not present

## 2019-12-24 DIAGNOSIS — D689 Coagulation defect, unspecified: Secondary | ICD-10-CM | POA: Diagnosis not present

## 2019-12-24 DIAGNOSIS — E785 Hyperlipidemia, unspecified: Secondary | ICD-10-CM | POA: Diagnosis not present

## 2019-12-24 DIAGNOSIS — M6281 Muscle weakness (generalized): Secondary | ICD-10-CM | POA: Diagnosis not present

## 2019-12-24 DIAGNOSIS — R296 Repeated falls: Secondary | ICD-10-CM | POA: Diagnosis not present

## 2019-12-24 DIAGNOSIS — G4700 Insomnia, unspecified: Secondary | ICD-10-CM | POA: Diagnosis not present

## 2019-12-24 DIAGNOSIS — Z4801 Encounter for change or removal of surgical wound dressing: Secondary | ICD-10-CM | POA: Diagnosis not present

## 2019-12-24 DIAGNOSIS — I4891 Unspecified atrial fibrillation: Secondary | ICD-10-CM | POA: Diagnosis not present

## 2019-12-24 DIAGNOSIS — M109 Gout, unspecified: Secondary | ICD-10-CM | POA: Diagnosis not present

## 2019-12-24 DIAGNOSIS — Z48812 Encounter for surgical aftercare following surgery on the circulatory system: Secondary | ICD-10-CM | POA: Diagnosis not present

## 2019-12-24 NOTE — Telephone Encounter (Signed)
Returned call to [place] at [facility] office number but was provided her cell number [phone]. LVM to discuss whats needed for patient.

## 2019-12-24 NOTE — Telephone Encounter (Signed)
Copied from [place] [id number]. Topic: General - Inquiry >> Dec 23, 2019  5:30 PM Alease Frame wrote: Reason for CRM: [place] [employer] calling from [facility] regarding patient   Call back [phone] office number

## 2019-12-28 DIAGNOSIS — I255 Ischemic cardiomyopathy: Secondary | ICD-10-CM | POA: Diagnosis not present

## 2019-12-28 DIAGNOSIS — R296 Repeated falls: Secondary | ICD-10-CM | POA: Diagnosis not present

## 2019-12-28 DIAGNOSIS — I130 Hypertensive heart and chronic kidney disease with heart failure and stage 1 through stage 4 chronic kidney disease, or unspecified chronic kidney disease: Secondary | ICD-10-CM | POA: Diagnosis not present

## 2019-12-28 DIAGNOSIS — Z8673 Personal history of transient ischemic attack (TIA), and cerebral infarction without residual deficits: Secondary | ICD-10-CM | POA: Diagnosis not present

## 2019-12-28 DIAGNOSIS — Z4801 Encounter for change or removal of surgical wound dressing: Secondary | ICD-10-CM | POA: Diagnosis not present

## 2019-12-28 DIAGNOSIS — I5022 Chronic systolic (congestive) heart failure: Secondary | ICD-10-CM | POA: Diagnosis not present

## 2019-12-28 DIAGNOSIS — D689 Coagulation defect, unspecified: Secondary | ICD-10-CM | POA: Diagnosis not present

## 2019-12-28 DIAGNOSIS — G4700 Insomnia, unspecified: Secondary | ICD-10-CM | POA: Diagnosis not present

## 2019-12-28 DIAGNOSIS — D631 Anemia in chronic kidney disease: Secondary | ICD-10-CM | POA: Diagnosis not present

## 2019-12-28 DIAGNOSIS — M109 Gout, unspecified: Secondary | ICD-10-CM | POA: Diagnosis not present

## 2019-12-28 DIAGNOSIS — E785 Hyperlipidemia, unspecified: Secondary | ICD-10-CM | POA: Diagnosis not present

## 2019-12-28 DIAGNOSIS — I2510 Atherosclerotic heart disease of native coronary artery without angina pectoris: Secondary | ICD-10-CM | POA: Diagnosis not present

## 2019-12-28 DIAGNOSIS — M6281 Muscle weakness (generalized): Secondary | ICD-10-CM | POA: Diagnosis not present

## 2019-12-28 DIAGNOSIS — Z48812 Encounter for surgical aftercare following surgery on the circulatory system: Secondary | ICD-10-CM | POA: Diagnosis not present

## 2019-12-28 DIAGNOSIS — M1712 Unilateral primary osteoarthritis, left knee: Secondary | ICD-10-CM | POA: Diagnosis not present

## 2019-12-28 DIAGNOSIS — I4891 Unspecified atrial fibrillation: Secondary | ICD-10-CM | POA: Diagnosis not present

## 2019-12-28 DIAGNOSIS — N183 Chronic kidney disease, stage 3 unspecified: Secondary | ICD-10-CM | POA: Diagnosis not present

## 2019-12-28 NOTE — Progress Notes (Signed)
HEMATOLOGY/ONCOLOGY CONSULTATION NOTE  Date of Service: 12/29/2019  Patient Care Team: [name], MD as PCP - General (Internal Medicine) [name], MD as PCP - Electrophysiology (Cardiology) [name], [name], MD as PCP - Advanced Heart Failure (Cardiology)  REFERRING PHYSICIAN: [name], *  CHIEF COMPLAINTS/PURPOSE OF CONSULTATION:  Bleeding Disorder, Iron deficiency, Monoclonal Protein Spike  HISTORY OF PRESENTING ILLNESS:  [facility]. is a wonderful 74 y.o. male who has been referred to [place] by [name], for evaluation and management of Bleeding Disorder, Iron deficiency. He is accompanied today by his wife. The pt reports that he is doing well overall.   The pt reports he is moving a little slower and that he was still have slight bleeding around  His PPM pocket.  No further gum bleeding or nose bleeding.  Of note prior to the patient's visit today, pt has had EP PPM Implant completed on 09/17/2019 with results revealing  1. ICD pocket hematoma successfully evacuated today 2. No early apparent complications.   Most recent lab results (12/29/19) of CBC is as follows: all values are WNL except for Hemoglobin at 12, RDW at 16, Total Protein at 9.1, Albumin at 3.3, aPTT at 42, Prothroin time at 15.7, and INR at 1.3. Ferritin now normal at 122 Sedimentation rate 16  On review of systems, pt reports that he is moving slowly, still experiencing difficulting breathing,and denies blood In stool, urine, nose bleeds, and abnormal bruising and any other symptoms.   INTERVAL HISTORY:  [facility]. is a wonderful 74 y.o. male who is here for evaluation and management of Bleeding Disorder, Iron deficiency. We are joined today by his wife, Mardene Celeste, via phone. The patient's last visit with [place] was on 09/24/2019. The pt reports that he is doing well overall.  The pt reports that he was having some having some bleeding problems with his  pacemaker. He states that it is healing up now and he has a nurse that helps him dress his wounds. He was following up with his PCP for his hematuria who said that the pt had a UTI and prescribed him Bactrim. The antibiotics did not appear to be working so his wife contacted pt's PCP who advised that the pt go to the ED. There was no cause given for pt's hematuria in the ED and they told pt to f/u with our clinic. Pt's hematuria is still about the same as before and he has completed the antibiotic that was prescribed. Pt denies any discomfort passing urine or taking blood thinners. Pt does not currently have an appointment with a Urologist.   Lab results (12/21/19) of CBC and CMP is as follows: all values are WNL except for WBC at 3.9K, RBC at 3.55, Hgb at 10.6, HCT at 33.8, Sodium at 131, Glucose at 100, BUN at 34, Creatinine at 1.95, Calcium at 8.7, Total Protein at 10.2, Albumin at 2.9, GFR Est Af Am at 39, Anion gap at 2.   On review of systems, pt reports hematuria and denies dysuria, abdominal pain, unexpected weight loss, leg swelling and any other symptoms.   MEDICAL HISTORY:  Past Medical History:  Diagnosis Date  . AICD (automatic cardioverter/defibrillator) present   . Atrial fibrillation   09/22/2012  . Blood loss anemia 04/18/2017   After GI bleed from colonoscopy and polypectomy  . CAD (coronary artery disease)   . Chronic systolic heart failure ([place])   . 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 GIB (gastrointestinal bleeding) 02/20/2017  . History of MRSA infection 05/2011  . HTN (hypertension)   . Hx of adenomatous colonic polyps 02/20/2017   01/2017 - 3 cm sigmoid TV adenoma and other smaller polyps - had post-polypectomy bleed Tx w/ clips Consider repeat colonoscopy 3 yrs [name], MD, [name]   . Hyperlipidemia   . implantable cardiac defibrillator-Biotronik    Device Implanted 2006; s/p gen change 03/2011 : bleeding persistent with  pocket erosion and infection; explant and reimplant  06/2011  . Ischemic cardiomyopathy    EF 15 to 20% by TTE and TEE in 09/2012.  Severe LV dysfunction  . Obesity    BMI 31 in 09/2012  . Persistent atrial fibrillation with rapid ventricular response ([place]) 04/21/2014  . Retroperitoneal bleed 04/21/2019  . Seizure disorder ([place]) latest 09/30/2012  . Seizures ([place])   . Stroke [facility])      SURGICAL HISTORY: Past Surgical History:  Procedure Laterality Date  . ALVEOLOPLASTY N/A 07/26/2019   Procedure: ALVEOLOPLASTY WITH RESUTURING OF ORAL WOUND;  Surgeon: [name], [name];  Location: [facility] ORS;  Service: Oral Surgery;  Laterality: N/A;  . CARDIAC CATHETERIZATION N/A 10/12/2015   Procedure: Right Heart Cath;  Surgeon: [name], MD;  Location: [place] CV LAB;  Service: Cardiovascular;  Laterality: N/A;  . CARDIOVERSION  09/24/2012   Procedure: CARDIOVERSION;  Surgeon: [name], MD;  Location: [facility] ENDOSCOPY;  Service: Cardiovascular;  Laterality: N/A;  . CARDIOVERSION N/A 12/25/2017   Procedure: CARDIOVERSION;  Surgeon: [name], MD;  Location: [facility];  Service: Cardiovascular;  Laterality: N/A;  . COLONOSCOPY W/ POLYPECTOMY  02/13/2017  . CORONARY ARTERY BYPASS GRAFT  2011   in [place] N/A 08/12/2019   Procedure: CAUTERIZATION OF ORAL BLEEDING;  Surgeon: [name], [name];  Location: [facility];  Service: Oral Surgery;  Laterality: N/A;  . FLEXIBLE SIGMOIDOSCOPY N/A 02/20/2017   Procedure: FLEXIBLE SIGMOIDOSCOPY;  Surgeon: [name], MD;  Location: [facility] ENDOSCOPY;  Service: Endoscopy;  Laterality: N/A;  . FLEXIBLE SIGMOIDOSCOPY N/A 02/21/2017   Procedure: FLEXIBLE SIGMOIDOSCOPY;  Surgeon: [name], MD;  Location: [facility] ENDOSCOPY;  Service: Endoscopy;  Laterality: N/A;  . ICD    . ICD GENERATOR CHANGEOUT N/A 09/10/2019   Procedure: [place];  Surgeon: [name], MD;  Location: [place] CV LAB;  Service: Cardiovascular;  Laterality: N/A;  .  ICD LEAD REMOVAL Right 11/13/2019   Procedure: ICD EXTRACTION and Lead Extraction;  Surgeon: [name], MD;  Location: [place];  Service: Cardiovascular;  Laterality: Right;  Dr. Servando Snare for backup  . POCKET REVISION/RELOCATION N/A 09/17/2019   Procedure: POCKET REVISION/RELOCATION;  Surgeon: Thompson Grayer, MD;  Location: [place] CV LAB;  Service: Cardiovascular;  Laterality: N/A;  . SUBMANDIBULAR GLAND EXCISION N/A 08/08/2019   Procedure: suture of oral wounds;  Surgeon: [name], [name];  Location: [facility];  Service: Oral Surgery;  Laterality: N/A;  . TEE WITHOUT CARDIOVERSION  09/24/2012   Procedure: TRANSESOPHAGEAL ECHOCARDIOGRAM (TEE);  Surgeon: [name], MD;  Location: [place];  Service: Cardiovascular;  Laterality: N/A;  dave/anesth, dl, cindy/echo   . TEE WITHOUT CARDIOVERSION N/A 11/12/2019   Procedure: TRANSESOPHAGEAL ECHOCARDIOGRAM (TEE);  Surgeon: [name], MD;  Location: [facility] ENDOSCOPY;  Service: Cardiovascular;  Laterality: N/A;  . TEE WITHOUT CARDIOVERSION N/A 11/13/2019   Procedure: TRANSESOPHAGEAL ECHOCARDIOGRAM (TEE);  Surgeon: [name], MD;  Location: [facility]  OR;  Service: Cardiovascular;  Laterality: N/A;     SOCIAL HISTORY: Social History   Socioeconomic History  . Marital status: Married    Spouse name: Not on file  . Number of children: Not on file  . Years of education: Not on file  . Highest education level: Not on file  Occupational History  . Not on file  Tobacco Use  . Smoking status: Former Smoker    Types: Pipe    Quit date: 09/21/2008    Years since quitting: 11.2  . Smokeless tobacco: Former Systems developer    Quit date: 09/21/2008  Substance and Sexual Activity  . Alcohol use: No  . Drug use: No  . Sexual activity: Not Currently    Birth control/protection: None  Other Topics Concern  . Not on file  Social History Narrative   ** Merged History Encounter **       Social Determinants of Health   Financial Resource Strain:    . Difficulty of Paying Living Expenses: Not on file  Food Insecurity:   . Worried About Charity fundraiser in the Last Year: Not on file  . Ran Out of Food in the Last Year: Not on file  Transportation Needs:   . Lack of Transportation (Medical): Not on file  . Lack of Transportation (Non-Medical): Not on file  Physical Activity:   . Days of Exercise per Week: Not on file  . Minutes of Exercise per Session: Not on file  Stress:   . Feeling of Stress : Not on file  Social Connections:   . Frequency of Communication with Friends and Family: Not on file  . Frequency of Social Gatherings with Friends and Family: Not on file  . Attends Religious Services: Not on file  . Active Member of Clubs or Organizations: Not on file  . Attends Archivist Meetings: Not on file  . Marital Status: Not on file  Intimate Partner Violence:   . Fear of Current or Ex-Partner: Not on file  . Emotionally Abused: Not on file  . Physically Abused: Not on file  . Sexually Abused: Not on file     FAMILY HISTORY: Family History  Problem Relation Age of Onset  . Arthritis Mother   . Heart disease Mother   . Heart attack Mother   . Other Father        smoker  . Hypertension Neg Hx        unknown  . Stroke Neg Hx        unknown     ALLERGIES:   has No Known Allergies.   MEDICATIONS:  Current Outpatient Medications  Medication Sig Dispense Refill  . acetaminophen (TYLENOL) 325 MG tablet Take 650 mg by mouth every 6 (six) hours as needed for mild pain.    [name] amiodarone (PACERONE) 200 MG tablet Take 1 tablet by mouth once daily (Patient taking differently: Take 200 mg by mouth daily. ) 30 tablet 0  . divalproex (DEPAKOTE ER) 500 MG 24 hr tablet Take 1 tablet every night (Patient taking differently: Take 500 mg by mouth at bedtime. ) 30 tablet 11  . DULoxetine (CYMBALTA) 30 MG capsule Take 1 capsule by mouth once daily 90 capsule 1  . feeding supplement, ENSURE ENLIVE, (ENSURE ENLIVE) LIQD  Take 237 mLs by mouth 3 (three) times daily between meals. 237 mL 12  . furosemide (LASIX) 40 MG tablet Take 1 tablet by mouth once daily 90 tablet 3  . levETIRAcetam (  KEPPRA) 1000 MG tablet Take 1 tablet (1,000 mg total) by mouth 2 (two) times daily. 60 tablet 11  . phenytoin (DILANTIN) 100 MG ER capsule Take 1 capsule (100 mg total) by mouth 2 (two) times daily. 60 capsule 11  . polyethylene glycol (MIRALAX / GLYCOLAX) 17 g packet Take 17 g by mouth daily as needed for mild constipation or moderate constipation. 14 each 0  . simvastatin (ZOCOR) 20 MG tablet TAKE 1 TABLET BY MOUTH ONCE DAILY AT 6PM (Patient taking differently: Take 20 mg by mouth daily at 6 PM. ) 90 tablet 1  . sulfamethoxazole-trimethoprim (BACTRIM DS) 800-160 MG tablet Take 1 tablet by mouth 2 (two) times daily. 10 tablet 0  . tamsulosin (FLOMAX) 0.4 MG CAPS capsule Take 1 capsule by mouth once daily (Patient taking differently: Take 0.4 mg by mouth daily. ) 90 capsule 0  . traMADol (ULTRAM) 50 MG tablet Take 1 tablet (50 mg total) by mouth every 6 (six) hours as needed for moderate pain. for pain 20 tablet 0  . carvedilol (COREG) 3.125 MG tablet Take 1 tablet (3.125 mg total) by mouth 2 (two) times daily with a meal. 60 tablet 1  . ferrous sulfate 325 (65 FE) MG tablet Take 1 tablet (325 mg total) by mouth 2 (two) times daily with a meal. (Patient taking differently: Take 325 mg by mouth daily with breakfast. ) 60 tablet 0   No current facility-administered medications for this visit.     REVIEW OF SYSTEMS:   A 10+ POINT REVIEW OF SYSTEMS WAS OBTAINED including neurology, dermatology, psychiatry, cardiac, respiratory, lymph, extremities, GI, GU, Musculoskeletal, constitutional, breasts, reproductive, HEENT.  All pertinent positives are noted in the HPI.  All others are negative.   PHYSICAL EXAMINATION: ECOG PERFORMANCE STATUS: 3 - Symptomatic, >50% confined to bed  Vitals:   12/29/19 1148  BP: 108/75  Pulse: 90  Resp:  18  Temp: 98.5 F (36.9 C)  SpO2: 100%   There were no vitals filed for this visit. There is no height or weight on file to calculate BMI.   Exam was given in a chair   GENERAL:alert, in no acute distress and comfortable SKIN: no acute rashes, no significant lesions EYES: conjunctiva are pink and non-injected, sclera anicteric OROPHARYNX: MMM, no exudates, no oropharyngeal erythema or ulceration NECK: supple, no JVD LYMPH:  no palpable lymphadenopathy in the cervical, axillary or inguinal regions LUNGS: clear to auscultation b/l with normal respiratory effort HEART: regular rate & rhythm ABDOMEN:  normoactive bowel sounds , non tender, not distended. No palpable hepatosplenomegaly.  Extremity: no pedal edema PSYCH: alert & oriented x 3 with fluent speech NEURO: no focal motor/sensory deficits  LABORATORY DATA:  I have reviewed the data as listed  CBC Latest Ref Rng & Units 12/21/2019 12/11/2019 11/25/2019  WBC 4.0 - 10.5 K/uL 3.9(L) 4.2 3.5  Hemoglobin 13.0 - 17.0 g/dL 10.6(L) 13.0 11.2(L)  Hematocrit 39.0 - 52.0 % 33.8(L) 40.0 33.2(L)  Platelets 150 - 400 K/uL 227 202.0 232    CMP Latest Ref Rng & Units 12/21/2019 12/11/2019 11/18/2019  Glucose 70 - 99 mg/dL 100(H) 77 82  BUN 8 - 23 mg/dL 34(H) 27(H) 11  Creatinine 0.61 - 1.24 mg/dL 1.95(H) 1.05 0.77  Sodium 135 - 145 mmol/L 131(L) 132(L) 133(L)  Potassium 3.5 - 5.1 mmol/L 4.7 3.8 3.9  Chloride 98 - 111 mmol/L 103 102 101  CO2 22 - 32 mmol/L _0 Calcium 8.9 - 10.3 mg/dL 8.7(L) 8.9  9.0  Total Protein 6.5 - 8.1 g/dL 10.2(H) 10.9(H) -  Total Bilirubin 0.3 - 1.2 mg/dL 0.5 0.3 -  Alkaline Phos 38 - 126 U/L 103 125(H) -  AST 15 - 41 U/L 25 16 -  ALT 0 - 44 U/L 20 12 -   Component     Latest Ref Rng & Units 04/17/2017 11/27/2017 07/22/2019 07/29/2019  IgG (Immunoglobin G), Serum     603 - 1,613 mg/dL    2,155 (H)  IgA     61 - 437 mg/dL    320  IgM (Immunoglobulin M), Srm     20 - 172 mg/dL    10 (L)  Total Protein  ELP     6.0 - 8.5 g/dL    6.0  Albumin SerPl Elph-Mcnc     2.9 - 4.4 g/dL    2.5 (L)  Alpha 1     0.0 - 0.4 g/dL    0.2  Alpha2 Glob SerPl Elph-Mcnc     0.4 - 1.0 g/dL    0.5  B-Globulin SerPl Elph-Mcnc     0.7 - 1.3 g/dL    0.5 (L)  Gamma Glob SerPl Elph-Mcnc     0.4 - 1.8 g/dL    2.2 (H)  M Protein SerPl Elph-Mcnc     Not Observed g/dL    1.6 (H)  Globulin, Total     2.2 - 3.9 g/dL    3.5  Albumin/Glob SerPl     0.7 - 1.7    0.8  IFE 1         Comment  Please Note (HCV):         Comment  Anticardiolipin Ab,IgA,Qn     0 - 11 APL U/mL      Anticardiolipin Ab,IgG,Qn     0 - 14 GPL U/mL      Anticardiolipin Ab,IgM,Qn     0 - 12 MPL U/mL      PTT Lupus Anticoagulant     0.0 - 51.9 sec      DRVVT     0.0 - 47.0 sec      Phosphatydalserine, IgG     0 - 11 GPS IgG      Phosphatydalserine, IgM     0 - 25 MPS IgM      Phosphatydalserine, IgA     0 - 20 APS IgA      Lupus Anticoag Interp           APTT     22.9 - 30.2 sec      aPTT 1:1 Normal Plasma     22.9 - 30.2 sec      aPTT 1:1 Mix Saline           aPTT 1:1 NP Mix, 60 Min,Incub.     22.9 - 30.2 sec      aPTT 1:1 NP Incub. Mix Ctl     22.9 - 30.2 sec      Coagulation Factor VIII     56 - 140 %    182 (H)  Ristocetin Co-factor, Plasma     50 - 200 %    91  Von Willebrand Antigen, Plasma     50 - 200 %    117  PT     9.6 - 11.5 sec      PT 1:1NP     9.6 - 11.5 sec      1 HR INCUB PT 1:1NP     9.6 -  11.5 sec      PFA Interpretation           Collagen / Epinephrine     0 - 193 seconds    132  Ferritin     24 - 336 ng/mL 13.0 (L)   28  Vitamin B12     211 - 911 pg/mL  321    Factor VII Activity     51 - 186 %   69   Factor XIII, Qualitative     No Lysis/24    NORMAL  Von Willebrand Multimers         Comment  Factor X Activity     76 - 183 %      Factor V Activity     70 - 150 %      Factor II Activity     50 - 154 %      THROMBIN TIME     0.0 - 23.0 sec      Interpretation         Note  Sed  Rate     0 - 16 mm/hr       Component     Latest Ref Rng & Units 07/31/2019 09/24/2019  IgG (Immunoglobin G), Serum     603 - 1,613 mg/dL    IgA     61 - 437 mg/dL    IgM (Immunoglobulin M), Srm     20 - 172 mg/dL    Total Protein ELP     6.0 - 8.5 g/dL    Albumin SerPl Elph-Mcnc     2.9 - 4.4 g/dL    Alpha 1     0.0 - 0.4 g/dL    Alpha2 Glob SerPl Elph-Mcnc     0.4 - 1.0 g/dL    B-Globulin SerPl Elph-Mcnc     0.7 - 1.3 g/dL    Gamma Glob SerPl Elph-Mcnc     0.4 - 1.8 g/dL    M Protein SerPl Elph-Mcnc     Not Observed g/dL    Globulin, Total     2.2 - 3.9 g/dL    Albumin/Glob SerPl     0.7 - 1.7    IFE 1         Please Note (HCV):         Anticardiolipin Ab,IgA,Qn     0 - 11 APL U/mL <9   Anticardiolipin Ab,IgG,Qn     0 - 14 GPL U/mL <9   Anticardiolipin Ab,IgM,Qn     0 - 12 MPL U/mL <9   PTT Lupus Anticoagulant     0.0 - 51.9 sec 64.2 (H)   DRVVT     0.0 - 47.0 sec 41.0   Phosphatydalserine, IgG     0 - 11 GPS IgG 3   Phosphatydalserine, IgM     0 - 25 MPS IgM 2   Phosphatydalserine, IgA     0 - 20 APS IgA 2   Lupus Anticoag Interp      Comment:   APTT     22.9 - 30.2 sec 35.8 (H)   aPTT 1:1 Normal Plasma     22.9 - 30.2 sec 32.4 (H)   aPTT 1:1 Mix Saline      NOT PERFORMED   aPTT 1:1 NP Mix, 60 Min,Incub.     22.9 - 30.2 sec 35.0 (H)   aPTT 1:1 NP Incub. Mix Ctl     22.9 - 30.2 sec 33.0 (H)   Coagulation  Factor VIII     56 - 140 %    Ristocetin Co-factor, Plasma     50 - 200 %    Von Willebrand Antigen, Plasma     50 - 200 %    PT     9.6 - 11.5 sec 12.5 (H)   PT 1:1NP     9.6 - 11.5 sec 10.9   1 HR INCUB PT 1:1NP     9.6 - 11.5 sec 11.4   PFA Interpretation         Collagen / Epinephrine     0 - 193 seconds    Ferritin     24 - 336 ng/mL  122  Vitamin B12     211 - 911 pg/mL    Factor VII Activity     51 - 186 %    Factor XIII, Qualitative     No Lysis/24    Von Willebrand Multimers         Factor X Activity     76 - 183 % 95     Factor V Activity     70 - 150 % 64 (L)   Factor II Activity     50 - 154 % 82   THROMBIN TIME     0.0 - 23.0 sec 17.8   Interpretation         Sed Rate     0 - 16 mm/hr  16    RADIOGRAPHIC STUDIES: I have personally reviewed the radiological images as listed and agreed with the findings in the report. No results found.   ASSESSMENT & PLAN:  Jerick Khachatryan. is a 74 y.o. 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 No issues with spontaneous bleeding at baseline.[name]  1) Post operative dental/gum bleeding requiring transfusions and rpt Alveoloplasty and thrombin soaked gel foam application on .  No clinically significant bleeding noted at this time. hgb has now normalized Drop in hgb to 6.8 previously likely reflecitng operative blood loss.  2) ? Factor VII deficiency - vs coumadin effect in past.  3) PPM pocket hematoma FVII levels 69% which is WNL Platelet function assay - WNL -rpt PT- slight elevation, aPTT- elevated -PT and PTT mixing studies though previously no concern with factor inhibitor. -,thrombin time, VWD panel, Factor XIII, factor 2, and 10 -- wnl -normal Thromboin time suggests against pararpoteinemia related coaguloapathy -factor V levels slightly low --concerning for liver disease. -some concern for possible CPVC of liver and possible related coaguloapthy causing mild elevation of PT and aPTT--- would support vit K dep clotting factors with vit K 22m po daily for 10-14 days. -also possibility of dysfibrinogenemia -not on anticoagulation for afib- due to significant bleeding issues -no indication for rFVIIa or [facility] at this time which is his case is not clearly indicated and would be high risk for thrombosis.   3) Iron deficiency -- received IV Iron  . No results found for: IRON, TIBC, IRONPCTSAT (Iron and  TIBC)  Lab Results  Component Value Date   FERRITIN 122 09/24/2019  -no indication for additional IV Iron at this time.  PLAN: -Discussed pt labwork, 12/21/19; all values are WNL except for WBC at 3.9K, RBC at 3.55, Hgb at 10.6, HCT at 33.8, Sodium at 131, Glucose at 100, BUN at 34, Creatinine at 1.95, Calcium at 8.7, Total Protein at 10.2, Albumin at  2.9, GFR Est Af Am at 39, Anion gap at 2.  -Advised pt that he has a complex bleeding problem and we have not been able to deduce the cause, which is why he was referred to another clinic for a second [place] pt of previous referral to [facility] for 2nd opinion regarding his bleeding diathesis - gave contact info -Will refer to Urologist for hematuria -Will set up CT Abd/Pel, provided kidney function is stable -Will get labs today -Discussed following up in 3 months with labs  4) IgG Kappa monoclonal paraproteinemia PLAN  Advised of the option to take a conservative approach to monitor protein levels to insure that he does not have multiple myeloma.   FOLLOW UP Labs today CT abd/pelvis Urology referral for hematuria Patient given information to call [facility] for hematology 2nd opinion RTC with Dr Irene Limbo with labs in 3 months   The total time spent in the appt was 30 minutes and more than 50% was on counseling and direct patient cares.  All of the patient's questions were answered with apparent satisfaction. The patient knows to call the clinic with any problems, questions or concerns.    Sullivan Lone MD [facility] AAHIVMS [facility] Hematology/Oncology Physician [facility]  (Office):       [phone] (Work cell):  [phone] (Fax):           [phone]  12/29/2019 12:14 PM  I, Yevette Edwards, am acting as a scribe for Dr. Sullivan Lone.   .I have reviewed the above documentation for accuracy and completeness, and I agree with the above. Brunetta Genera MD

## 2019-12-29 ENCOUNTER — Encounter: Payer: Self-pay | Admitting: Hematology

## 2019-12-29 ENCOUNTER — Inpatient Hospital Stay: Payer: Medicare Other | Attending: Hematology | Admitting: Hematology

## 2019-12-29 ENCOUNTER — Inpatient Hospital Stay: Payer: Medicare Other

## 2019-12-29 ENCOUNTER — Other Ambulatory Visit: Payer: Self-pay

## 2019-12-29 VITALS — BP 108/75 | HR 90 | Temp 98.5°F | Resp 18

## 2019-12-29 DIAGNOSIS — D472 Monoclonal gammopathy: Secondary | ICD-10-CM

## 2019-12-29 DIAGNOSIS — D688 Other specified coagulation defects: Secondary | ICD-10-CM | POA: Diagnosis not present

## 2019-12-29 DIAGNOSIS — D500 Iron deficiency anemia secondary to blood loss (chronic): Secondary | ICD-10-CM

## 2019-12-29 DIAGNOSIS — E611 Iron deficiency: Secondary | ICD-10-CM | POA: Diagnosis not present

## 2019-12-29 DIAGNOSIS — N183 Chronic kidney disease, stage 3 unspecified: Secondary | ICD-10-CM | POA: Diagnosis not present

## 2019-12-29 DIAGNOSIS — M109 Gout, unspecified: Secondary | ICD-10-CM | POA: Diagnosis not present

## 2019-12-29 DIAGNOSIS — Z8673 Personal history of transient ischemic attack (TIA), and cerebral infarction without residual deficits: Secondary | ICD-10-CM | POA: Diagnosis not present

## 2019-12-29 DIAGNOSIS — I5020 Unspecified systolic (congestive) heart failure: Secondary | ICD-10-CM | POA: Diagnosis not present

## 2019-12-29 DIAGNOSIS — D682 Hereditary deficiency of other clotting factors: Secondary | ICD-10-CM

## 2019-12-29 DIAGNOSIS — Z48812 Encounter for surgical aftercare following surgery on the circulatory system: Secondary | ICD-10-CM | POA: Diagnosis not present

## 2019-12-29 DIAGNOSIS — Z87891 Personal history of nicotine dependence: Secondary | ICD-10-CM | POA: Insufficient documentation

## 2019-12-29 DIAGNOSIS — I255 Ischemic cardiomyopathy: Secondary | ICD-10-CM | POA: Diagnosis not present

## 2019-12-29 DIAGNOSIS — R296 Repeated falls: Secondary | ICD-10-CM | POA: Diagnosis not present

## 2019-12-29 DIAGNOSIS — D699 Hemorrhagic condition, unspecified: Secondary | ICD-10-CM

## 2019-12-29 DIAGNOSIS — D631 Anemia in chronic kidney disease: Secondary | ICD-10-CM | POA: Diagnosis not present

## 2019-12-29 DIAGNOSIS — E785 Hyperlipidemia, unspecified: Secondary | ICD-10-CM | POA: Diagnosis not present

## 2019-12-29 DIAGNOSIS — M1712 Unilateral primary osteoarthritis, left knee: Secondary | ICD-10-CM | POA: Diagnosis not present

## 2019-12-29 DIAGNOSIS — G4700 Insomnia, unspecified: Secondary | ICD-10-CM | POA: Diagnosis not present

## 2019-12-29 DIAGNOSIS — I4891 Unspecified atrial fibrillation: Secondary | ICD-10-CM | POA: Diagnosis not present

## 2019-12-29 DIAGNOSIS — Z950 Presence of cardiac pacemaker: Secondary | ICD-10-CM | POA: Diagnosis not present

## 2019-12-29 DIAGNOSIS — R319 Hematuria, unspecified: Secondary | ICD-10-CM | POA: Diagnosis not present

## 2019-12-29 DIAGNOSIS — I2510 Atherosclerotic heart disease of native coronary artery without angina pectoris: Secondary | ICD-10-CM | POA: Diagnosis not present

## 2019-12-29 DIAGNOSIS — Z4801 Encounter for change or removal of surgical wound dressing: Secondary | ICD-10-CM | POA: Diagnosis not present

## 2019-12-29 DIAGNOSIS — I5022 Chronic systolic (congestive) heart failure: Secondary | ICD-10-CM | POA: Diagnosis not present

## 2019-12-29 DIAGNOSIS — I130 Hypertensive heart and chronic kidney disease with heart failure and stage 1 through stage 4 chronic kidney disease, or unspecified chronic kidney disease: Secondary | ICD-10-CM | POA: Diagnosis not present

## 2019-12-29 DIAGNOSIS — D689 Coagulation defect, unspecified: Secondary | ICD-10-CM | POA: Diagnosis not present

## 2019-12-29 DIAGNOSIS — M6281 Muscle weakness (generalized): Secondary | ICD-10-CM | POA: Diagnosis not present

## 2019-12-29 LAB — CMP (CANCER CENTER ONLY)
ALT: 29 U/L (ref 0–44)
AST: 34 U/L (ref 15–41)
Albumin: 3.2 g/dL — ABNORMAL LOW (ref 3.5–5.0)
Alkaline Phosphatase: 116 U/L (ref 38–126)
Anion gap: 4 — ABNORMAL LOW (ref 5–15)
BUN: 35 mg/dL — ABNORMAL HIGH (ref 8–23)
CO2: 27 mmol/L (ref 22–32)
Calcium: 8.5 mg/dL — ABNORMAL LOW (ref 8.9–10.3)
Chloride: 104 mmol/L (ref 98–111)
Creatinine: 1.57 mg/dL — ABNORMAL HIGH (ref 0.61–1.24)
GFR, Est AFR Am: 51 mL/min — ABNORMAL LOW (ref >60)
GFR, Estimated: 44 mL/min — ABNORMAL LOW (ref >60)
Glucose, Bld: 115 mg/dL — ABNORMAL HIGH (ref 70–99)
Potassium: 4.5 mmol/L (ref 3.5–5.1)
Sodium: 135 mmol/L (ref 135–145)
Total Bilirubin: 0.4 mg/dL (ref 0.3–1.2)
Total Protein: 11.1 g/dL — ABNORMAL HIGH (ref 6.5–8.1)

## 2019-12-29 LAB — CBC WITH DIFFERENTIAL/PLATELET
Abs Immature Granulocytes: 0.01 10*3/uL (ref 0.00–0.07)
Basophils Absolute: 0 10*3/uL (ref 0.0–0.1)
Basophils Relative: 1 %
Eosinophils Absolute: 0.1 10*3/uL (ref 0.0–0.5)
Eosinophils Relative: 4 %
HCT: 31.3 % — ABNORMAL LOW (ref 39.0–52.0)
Hemoglobin: 9.9 g/dL — ABNORMAL LOW (ref 13.0–17.0)
Immature Granulocytes: 0 %
Lymphocytes Relative: 32 %
Lymphs Abs: 1 10*3/uL (ref 0.7–4.0)
MCH: 30.2 pg (ref 26.0–34.0)
MCHC: 31.6 g/dL (ref 30.0–36.0)
MCV: 95.4 fL (ref 80.0–100.0)
Monocytes Absolute: 0.3 10*3/uL (ref 0.1–1.0)
Monocytes Relative: 11 %
Neutro Abs: 1.6 10*3/uL — ABNORMAL LOW (ref 1.7–7.7)
Neutrophils Relative %: 52 %
Platelets: 247 10*3/uL (ref 150–400)
RBC: 3.28 MIL/uL — ABNORMAL LOW (ref 4.22–5.81)
RDW: 15.6 % — ABNORMAL HIGH (ref 11.5–15.5)
WBC: 3.1 10*3/uL — ABNORMAL LOW (ref 4.0–10.5)
nRBC: 0 % (ref 0.0–0.2)

## 2019-12-29 LAB — FERRITIN
Ferritin: 67 ng/mL (ref 24–336)
Ferritin: 69 ng/mL (ref 24–336)

## 2019-12-29 LAB — IRON AND TIBC
Iron: 194 ug/dL — ABNORMAL HIGH (ref 42–163)
Iron: 196 ug/dL — ABNORMAL HIGH (ref 42–163)
Saturation Ratios: 94 % — ABNORMAL HIGH (ref 20–55)
Saturation Ratios: 94 % — ABNORMAL HIGH (ref 20–55)
TIBC: 206 ug/dL (ref 202–409)
TIBC: 209 ug/dL (ref 202–409)
UIBC: 12 ug/dL — ABNORMAL LOW (ref 117–376)
UIBC: 13 ug/dL — ABNORMAL LOW (ref 117–376)

## 2019-12-29 LAB — PROTIME-INR
INR: 1.3 — ABNORMAL HIGH (ref 0.8–1.2)
Prothrombin Time: 15.9 seconds — ABNORMAL HIGH (ref 11.4–15.2)

## 2019-12-29 LAB — ABO/RH: ABO/RH(D): O POS

## 2019-12-29 LAB — TYPE AND SCREEN
ABO/RH(D): O POS
Antibody Screen: NEGATIVE

## 2019-12-29 LAB — APTT: aPTT: 50 seconds — ABNORMAL HIGH (ref 24–36)

## 2019-12-30 ENCOUNTER — Telehealth: Payer: Self-pay | Admitting: Hematology

## 2019-12-30 LAB — KAPPA/LAMBDA LIGHT CHAINS
Kappa free light chain: 23.1 mg/L — ABNORMAL HIGH (ref 3.3–19.4)
Kappa, lambda light chain ratio: 3.25 — ABNORMAL HIGH (ref 0.26–1.65)
Lambda free light chains: 7.1 mg/L (ref 5.7–26.3)

## 2019-12-30 LAB — BETA 2 MICROGLOBULIN, SERUM: Beta-2 Microglobulin: 3.8 mg/L — ABNORMAL HIGH (ref 0.6–2.4)

## 2019-12-30 NOTE — Telephone Encounter (Signed)
Referral placed to [facility] via [facility] 

## 2019-12-31 DIAGNOSIS — G4700 Insomnia, unspecified: Secondary | ICD-10-CM | POA: Diagnosis not present

## 2019-12-31 DIAGNOSIS — M6281 Muscle weakness (generalized): Secondary | ICD-10-CM | POA: Diagnosis not present

## 2019-12-31 DIAGNOSIS — I5022 Chronic systolic (congestive) heart failure: Secondary | ICD-10-CM | POA: Diagnosis not present

## 2019-12-31 DIAGNOSIS — D631 Anemia in chronic kidney disease: Secondary | ICD-10-CM | POA: Diagnosis not present

## 2019-12-31 DIAGNOSIS — I255 Ischemic cardiomyopathy: Secondary | ICD-10-CM | POA: Diagnosis not present

## 2019-12-31 DIAGNOSIS — N183 Chronic kidney disease, stage 3 unspecified: Secondary | ICD-10-CM | POA: Diagnosis not present

## 2019-12-31 DIAGNOSIS — M109 Gout, unspecified: Secondary | ICD-10-CM | POA: Diagnosis not present

## 2019-12-31 DIAGNOSIS — I4891 Unspecified atrial fibrillation: Secondary | ICD-10-CM | POA: Diagnosis not present

## 2019-12-31 DIAGNOSIS — M1712 Unilateral primary osteoarthritis, left knee: Secondary | ICD-10-CM | POA: Diagnosis not present

## 2019-12-31 DIAGNOSIS — Z8673 Personal history of transient ischemic attack (TIA), and cerebral infarction without residual deficits: Secondary | ICD-10-CM | POA: Diagnosis not present

## 2019-12-31 DIAGNOSIS — Z4801 Encounter for change or removal of surgical wound dressing: Secondary | ICD-10-CM | POA: Diagnosis not present

## 2019-12-31 DIAGNOSIS — I2510 Atherosclerotic heart disease of native coronary artery without angina pectoris: Secondary | ICD-10-CM | POA: Diagnosis not present

## 2019-12-31 DIAGNOSIS — I130 Hypertensive heart and chronic kidney disease with heart failure and stage 1 through stage 4 chronic kidney disease, or unspecified chronic kidney disease: Secondary | ICD-10-CM | POA: Diagnosis not present

## 2019-12-31 DIAGNOSIS — D689 Coagulation defect, unspecified: Secondary | ICD-10-CM | POA: Diagnosis not present

## 2019-12-31 DIAGNOSIS — R296 Repeated falls: Secondary | ICD-10-CM | POA: Diagnosis not present

## 2019-12-31 DIAGNOSIS — E785 Hyperlipidemia, unspecified: Secondary | ICD-10-CM | POA: Diagnosis not present

## 2019-12-31 DIAGNOSIS — Z48812 Encounter for surgical aftercare following surgery on the circulatory system: Secondary | ICD-10-CM | POA: Diagnosis not present

## 2019-12-31 LAB — MULTIPLE MYELOMA PANEL, SERUM
Albumin SerPl Elph-Mcnc: 3.7 g/dL (ref 2.9–4.4)
Albumin/Glob SerPl: 0.6 — ABNORMAL LOW (ref 0.7–1.7)
Alpha 1: 0.2 g/dL (ref 0.0–0.4)
Alpha2 Glob SerPl Elph-Mcnc: 0.4 g/dL (ref 0.4–1.0)
B-Globulin SerPl Elph-Mcnc: 0.7 g/dL (ref 0.7–1.3)
Gamma Glob SerPl Elph-Mcnc: 5.4 g/dL — ABNORMAL HIGH (ref 0.4–1.8)
Globulin, Total: 6.7 g/dL — ABNORMAL HIGH (ref 2.2–3.9)
IgA: 356 mg/dL (ref 61–437)
IgG (Immunoglobin G), Serum: 6364 mg/dL — ABNORMAL HIGH (ref 603–1613)
IgM (Immunoglobulin M), Srm: 10 mg/dL — ABNORMAL LOW (ref 15–143)
M Protein SerPl Elph-Mcnc: 4.6 g/dL — ABNORMAL HIGH
Total Protein ELP: 10.4 g/dL — ABNORMAL HIGH (ref 6.0–8.5)

## 2020-01-04 DIAGNOSIS — I5022 Chronic systolic (congestive) heart failure: Secondary | ICD-10-CM | POA: Diagnosis not present

## 2020-01-04 DIAGNOSIS — M6281 Muscle weakness (generalized): Secondary | ICD-10-CM | POA: Diagnosis not present

## 2020-01-04 DIAGNOSIS — N183 Chronic kidney disease, stage 3 unspecified: Secondary | ICD-10-CM | POA: Diagnosis not present

## 2020-01-04 DIAGNOSIS — G4700 Insomnia, unspecified: Secondary | ICD-10-CM | POA: Diagnosis not present

## 2020-01-04 DIAGNOSIS — Z8673 Personal history of transient ischemic attack (TIA), and cerebral infarction without residual deficits: Secondary | ICD-10-CM | POA: Diagnosis not present

## 2020-01-04 DIAGNOSIS — Z4801 Encounter for change or removal of surgical wound dressing: Secondary | ICD-10-CM | POA: Diagnosis not present

## 2020-01-04 DIAGNOSIS — I2510 Atherosclerotic heart disease of native coronary artery without angina pectoris: Secondary | ICD-10-CM | POA: Diagnosis not present

## 2020-01-04 DIAGNOSIS — D689 Coagulation defect, unspecified: Secondary | ICD-10-CM | POA: Diagnosis not present

## 2020-01-04 DIAGNOSIS — Z48812 Encounter for surgical aftercare following surgery on the circulatory system: Secondary | ICD-10-CM | POA: Diagnosis not present

## 2020-01-04 DIAGNOSIS — D631 Anemia in chronic kidney disease: Secondary | ICD-10-CM | POA: Diagnosis not present

## 2020-01-04 DIAGNOSIS — I130 Hypertensive heart and chronic kidney disease with heart failure and stage 1 through stage 4 chronic kidney disease, or unspecified chronic kidney disease: Secondary | ICD-10-CM | POA: Diagnosis not present

## 2020-01-04 DIAGNOSIS — I255 Ischemic cardiomyopathy: Secondary | ICD-10-CM | POA: Diagnosis not present

## 2020-01-04 DIAGNOSIS — M1712 Unilateral primary osteoarthritis, left knee: Secondary | ICD-10-CM | POA: Diagnosis not present

## 2020-01-04 DIAGNOSIS — I4891 Unspecified atrial fibrillation: Secondary | ICD-10-CM | POA: Diagnosis not present

## 2020-01-04 DIAGNOSIS — E785 Hyperlipidemia, unspecified: Secondary | ICD-10-CM | POA: Diagnosis not present

## 2020-01-04 DIAGNOSIS — M109 Gout, unspecified: Secondary | ICD-10-CM | POA: Diagnosis not present

## 2020-01-04 DIAGNOSIS — R296 Repeated falls: Secondary | ICD-10-CM | POA: Diagnosis not present

## 2020-01-05 ENCOUNTER — Other Ambulatory Visit: Payer: Self-pay | Admitting: Hematology

## 2020-01-05 ENCOUNTER — Telehealth: Payer: Self-pay | Admitting: *Deleted

## 2020-01-05 NOTE — Telephone Encounter (Signed)
Contacted patient to advise on results of lab tests as directed by Dr.Kale below. Spoke with Ms. Wenke. She states he has appt at [facility] with [name] (hematology) next week. They want to see what she recommends before they schedule further tests for patient at this location. Mr/Ms. Christiana will call Dr. Grier Mitts office to update following appt with [name].

## 2020-01-05 NOTE — Telephone Encounter (Signed)
-----   Message from Brunetta Genera, MD sent at 01/04/2020 11:49 PM EST ----- Hi Sandi -- plz let patient know his labs show concerns for progression to myeloma -- would need PET/CT and BM Bx to complete evaluation. COuld setup phone visit to go over results . Will order pet/ct and bm bx if patientagreeable. thx

## 2020-01-06 DIAGNOSIS — R310 Gross hematuria: Secondary | ICD-10-CM | POA: Diagnosis not present

## 2020-01-07 DIAGNOSIS — M1712 Unilateral primary osteoarthritis, left knee: Secondary | ICD-10-CM | POA: Diagnosis not present

## 2020-01-07 DIAGNOSIS — D631 Anemia in chronic kidney disease: Secondary | ICD-10-CM | POA: Diagnosis not present

## 2020-01-07 DIAGNOSIS — N183 Chronic kidney disease, stage 3 unspecified: Secondary | ICD-10-CM | POA: Diagnosis not present

## 2020-01-07 DIAGNOSIS — Z48812 Encounter for surgical aftercare following surgery on the circulatory system: Secondary | ICD-10-CM | POA: Diagnosis not present

## 2020-01-07 DIAGNOSIS — G4700 Insomnia, unspecified: Secondary | ICD-10-CM | POA: Diagnosis not present

## 2020-01-07 DIAGNOSIS — D689 Coagulation defect, unspecified: Secondary | ICD-10-CM | POA: Diagnosis not present

## 2020-01-07 DIAGNOSIS — I4891 Unspecified atrial fibrillation: Secondary | ICD-10-CM | POA: Diagnosis not present

## 2020-01-07 DIAGNOSIS — Z4801 Encounter for change or removal of surgical wound dressing: Secondary | ICD-10-CM | POA: Diagnosis not present

## 2020-01-07 DIAGNOSIS — I2510 Atherosclerotic heart disease of native coronary artery without angina pectoris: Secondary | ICD-10-CM | POA: Diagnosis not present

## 2020-01-07 DIAGNOSIS — E785 Hyperlipidemia, unspecified: Secondary | ICD-10-CM | POA: Diagnosis not present

## 2020-01-07 DIAGNOSIS — I130 Hypertensive heart and chronic kidney disease with heart failure and stage 1 through stage 4 chronic kidney disease, or unspecified chronic kidney disease: Secondary | ICD-10-CM | POA: Diagnosis not present

## 2020-01-07 DIAGNOSIS — I5022 Chronic systolic (congestive) heart failure: Secondary | ICD-10-CM | POA: Diagnosis not present

## 2020-01-07 DIAGNOSIS — M109 Gout, unspecified: Secondary | ICD-10-CM | POA: Diagnosis not present

## 2020-01-07 DIAGNOSIS — Z8673 Personal history of transient ischemic attack (TIA), and cerebral infarction without residual deficits: Secondary | ICD-10-CM | POA: Diagnosis not present

## 2020-01-07 DIAGNOSIS — M6281 Muscle weakness (generalized): Secondary | ICD-10-CM | POA: Diagnosis not present

## 2020-01-07 DIAGNOSIS — R296 Repeated falls: Secondary | ICD-10-CM | POA: Diagnosis not present

## 2020-01-07 DIAGNOSIS — I255 Ischemic cardiomyopathy: Secondary | ICD-10-CM | POA: Diagnosis not present

## 2020-01-08 DIAGNOSIS — I130 Hypertensive heart and chronic kidney disease with heart failure and stage 1 through stage 4 chronic kidney disease, or unspecified chronic kidney disease: Secondary | ICD-10-CM | POA: Diagnosis not present

## 2020-01-08 DIAGNOSIS — Z8673 Personal history of transient ischemic attack (TIA), and cerebral infarction without residual deficits: Secondary | ICD-10-CM | POA: Diagnosis not present

## 2020-01-08 DIAGNOSIS — Z48812 Encounter for surgical aftercare following surgery on the circulatory system: Secondary | ICD-10-CM | POA: Diagnosis not present

## 2020-01-08 DIAGNOSIS — M1712 Unilateral primary osteoarthritis, left knee: Secondary | ICD-10-CM | POA: Diagnosis not present

## 2020-01-08 DIAGNOSIS — I2510 Atherosclerotic heart disease of native coronary artery without angina pectoris: Secondary | ICD-10-CM | POA: Diagnosis not present

## 2020-01-08 DIAGNOSIS — M109 Gout, unspecified: Secondary | ICD-10-CM | POA: Diagnosis not present

## 2020-01-08 DIAGNOSIS — D631 Anemia in chronic kidney disease: Secondary | ICD-10-CM | POA: Diagnosis not present

## 2020-01-08 DIAGNOSIS — I4891 Unspecified atrial fibrillation: Secondary | ICD-10-CM | POA: Diagnosis not present

## 2020-01-08 DIAGNOSIS — I255 Ischemic cardiomyopathy: Secondary | ICD-10-CM | POA: Diagnosis not present

## 2020-01-08 DIAGNOSIS — G4700 Insomnia, unspecified: Secondary | ICD-10-CM | POA: Diagnosis not present

## 2020-01-08 DIAGNOSIS — D689 Coagulation defect, unspecified: Secondary | ICD-10-CM | POA: Diagnosis not present

## 2020-01-08 DIAGNOSIS — R296 Repeated falls: Secondary | ICD-10-CM | POA: Diagnosis not present

## 2020-01-08 DIAGNOSIS — E785 Hyperlipidemia, unspecified: Secondary | ICD-10-CM | POA: Diagnosis not present

## 2020-01-08 DIAGNOSIS — N183 Chronic kidney disease, stage 3 unspecified: Secondary | ICD-10-CM | POA: Diagnosis not present

## 2020-01-08 DIAGNOSIS — M6281 Muscle weakness (generalized): Secondary | ICD-10-CM | POA: Diagnosis not present

## 2020-01-08 DIAGNOSIS — Z4801 Encounter for change or removal of surgical wound dressing: Secondary | ICD-10-CM | POA: Diagnosis not present

## 2020-01-08 DIAGNOSIS — I5022 Chronic systolic (congestive) heart failure: Secondary | ICD-10-CM | POA: Diagnosis not present

## 2020-01-11 DIAGNOSIS — M109 Gout, unspecified: Secondary | ICD-10-CM | POA: Diagnosis not present

## 2020-01-11 DIAGNOSIS — D631 Anemia in chronic kidney disease: Secondary | ICD-10-CM | POA: Diagnosis not present

## 2020-01-11 DIAGNOSIS — R296 Repeated falls: Secondary | ICD-10-CM | POA: Diagnosis not present

## 2020-01-11 DIAGNOSIS — I2510 Atherosclerotic heart disease of native coronary artery without angina pectoris: Secondary | ICD-10-CM | POA: Diagnosis not present

## 2020-01-11 DIAGNOSIS — N183 Chronic kidney disease, stage 3 unspecified: Secondary | ICD-10-CM | POA: Diagnosis not present

## 2020-01-11 DIAGNOSIS — I255 Ischemic cardiomyopathy: Secondary | ICD-10-CM | POA: Diagnosis not present

## 2020-01-11 DIAGNOSIS — G4700 Insomnia, unspecified: Secondary | ICD-10-CM | POA: Diagnosis not present

## 2020-01-11 DIAGNOSIS — Z8673 Personal history of transient ischemic attack (TIA), and cerebral infarction without residual deficits: Secondary | ICD-10-CM | POA: Diagnosis not present

## 2020-01-11 DIAGNOSIS — I130 Hypertensive heart and chronic kidney disease with heart failure and stage 1 through stage 4 chronic kidney disease, or unspecified chronic kidney disease: Secondary | ICD-10-CM | POA: Diagnosis not present

## 2020-01-11 DIAGNOSIS — M6281 Muscle weakness (generalized): Secondary | ICD-10-CM | POA: Diagnosis not present

## 2020-01-11 DIAGNOSIS — Z4801 Encounter for change or removal of surgical wound dressing: Secondary | ICD-10-CM | POA: Diagnosis not present

## 2020-01-11 DIAGNOSIS — D689 Coagulation defect, unspecified: Secondary | ICD-10-CM | POA: Diagnosis not present

## 2020-01-11 DIAGNOSIS — I5022 Chronic systolic (congestive) heart failure: Secondary | ICD-10-CM | POA: Diagnosis not present

## 2020-01-11 DIAGNOSIS — E785 Hyperlipidemia, unspecified: Secondary | ICD-10-CM | POA: Diagnosis not present

## 2020-01-11 DIAGNOSIS — I4891 Unspecified atrial fibrillation: Secondary | ICD-10-CM | POA: Diagnosis not present

## 2020-01-11 DIAGNOSIS — Z48812 Encounter for surgical aftercare following surgery on the circulatory system: Secondary | ICD-10-CM | POA: Diagnosis not present

## 2020-01-11 DIAGNOSIS — M1712 Unilateral primary osteoarthritis, left knee: Secondary | ICD-10-CM | POA: Diagnosis not present

## 2020-01-12 ENCOUNTER — Encounter (HOSPITAL_COMMUNITY): Payer: Self-pay | Admitting: Internal Medicine

## 2020-01-12 ENCOUNTER — Other Ambulatory Visit: Payer: Self-pay

## 2020-01-12 ENCOUNTER — Telehealth: Payer: Self-pay

## 2020-01-12 ENCOUNTER — Inpatient Hospital Stay (HOSPITAL_COMMUNITY)
Admission: EM | Admit: 2020-01-12 | Discharge: 2020-01-18 | DRG: 699 | Disposition: A | Discharge status: Home Health | Payer: Medicare Other | Source: Ambulatory Visit | Attending: Internal Medicine | Admitting: Internal Medicine

## 2020-01-12 ENCOUNTER — Observation Stay (HOSPITAL_COMMUNITY): Payer: Medicare Other

## 2020-01-12 ENCOUNTER — Ambulatory Visit (INDEPENDENT_AMBULATORY_CARE_PROVIDER_SITE_OTHER): Payer: Medicare Other | Admitting: *Deleted

## 2020-01-12 ENCOUNTER — Emergency Department (HOSPITAL_COMMUNITY): Payer: Medicare Other

## 2020-01-12 ENCOUNTER — Telehealth: Payer: Self-pay | Admitting: Emergency Medicine

## 2020-01-12 DIAGNOSIS — I110 Hypertensive heart disease with heart failure: Secondary | ICD-10-CM | POA: Diagnosis not present

## 2020-01-12 DIAGNOSIS — Z20822 Contact with and (suspected) exposure to covid-19: Secondary | ICD-10-CM | POA: Diagnosis present

## 2020-01-12 DIAGNOSIS — D682 Hereditary deficiency of other clotting factors: Secondary | ICD-10-CM | POA: Diagnosis present

## 2020-01-12 DIAGNOSIS — Z8261 Family history of arthritis: Secondary | ICD-10-CM | POA: Diagnosis not present

## 2020-01-12 DIAGNOSIS — R0602 Shortness of breath: Secondary | ICD-10-CM | POA: Diagnosis not present

## 2020-01-12 DIAGNOSIS — F419 Anxiety disorder, unspecified: Secondary | ICD-10-CM | POA: Diagnosis present

## 2020-01-12 DIAGNOSIS — D689 Coagulation defect, unspecified: Secondary | ICD-10-CM

## 2020-01-12 DIAGNOSIS — R310 Gross hematuria: Secondary | ICD-10-CM | POA: Diagnosis not present

## 2020-01-12 DIAGNOSIS — R569 Unspecified convulsions: Secondary | ICD-10-CM

## 2020-01-12 DIAGNOSIS — Z87891 Personal history of nicotine dependence: Secondary | ICD-10-CM | POA: Diagnosis not present

## 2020-01-12 DIAGNOSIS — N028 Recurrent and persistent hematuria with other morphologic changes: Principal | ICD-10-CM | POA: Diagnosis present

## 2020-01-12 DIAGNOSIS — Z86010 Personal history of colonic polyps: Secondary | ICD-10-CM

## 2020-01-12 DIAGNOSIS — N179 Acute kidney failure, unspecified: Secondary | ICD-10-CM | POA: Diagnosis not present

## 2020-01-12 DIAGNOSIS — I5022 Chronic systolic (congestive) heart failure: Secondary | ICD-10-CM | POA: Diagnosis present

## 2020-01-12 DIAGNOSIS — D62 Acute posthemorrhagic anemia: Secondary | ICD-10-CM | POA: Diagnosis not present

## 2020-01-12 DIAGNOSIS — Z951 Presence of aortocoronary bypass graft: Secondary | ICD-10-CM

## 2020-01-12 DIAGNOSIS — I255 Ischemic cardiomyopathy: Secondary | ICD-10-CM | POA: Diagnosis not present

## 2020-01-12 DIAGNOSIS — D649 Anemia, unspecified: Secondary | ICD-10-CM | POA: Diagnosis not present

## 2020-01-12 DIAGNOSIS — I2510 Atherosclerotic heart disease of native coronary artery without angina pectoris: Secondary | ICD-10-CM | POA: Diagnosis present

## 2020-01-12 DIAGNOSIS — Z8673 Personal history of transient ischemic attack (TIA), and cerebral infarction without residual deficits: Secondary | ICD-10-CM

## 2020-01-12 DIAGNOSIS — I480 Paroxysmal atrial fibrillation: Secondary | ICD-10-CM | POA: Diagnosis present

## 2020-01-12 DIAGNOSIS — C9000 Multiple myeloma not having achieved remission: Secondary | ICD-10-CM | POA: Diagnosis present

## 2020-01-12 DIAGNOSIS — E785 Hyperlipidemia, unspecified: Secondary | ICD-10-CM | POA: Diagnosis not present

## 2020-01-12 DIAGNOSIS — Z79899 Other long term (current) drug therapy: Secondary | ICD-10-CM

## 2020-01-12 DIAGNOSIS — N400 Enlarged prostate without lower urinary tract symptoms: Secondary | ICD-10-CM | POA: Diagnosis present

## 2020-01-12 DIAGNOSIS — Q619 Cystic kidney disease, unspecified: Secondary | ICD-10-CM

## 2020-01-12 DIAGNOSIS — R319 Hematuria, unspecified: Secondary | ICD-10-CM | POA: Diagnosis not present

## 2020-01-12 DIAGNOSIS — G40909 Epilepsy, unspecified, not intractable, without status epilepticus: Secondary | ICD-10-CM | POA: Diagnosis present

## 2020-01-12 DIAGNOSIS — N1330 Unspecified hydronephrosis: Secondary | ICD-10-CM | POA: Diagnosis not present

## 2020-01-12 DIAGNOSIS — Z87440 Personal history of urinary (tract) infections: Secondary | ICD-10-CM

## 2020-01-12 DIAGNOSIS — E875 Hyperkalemia: Secondary | ICD-10-CM | POA: Diagnosis not present

## 2020-01-12 DIAGNOSIS — Z95810 Presence of automatic (implantable) cardiac defibrillator: Secondary | ICD-10-CM

## 2020-01-12 DIAGNOSIS — E871 Hypo-osmolality and hyponatremia: Secondary | ICD-10-CM | POA: Diagnosis present

## 2020-01-12 DIAGNOSIS — N281 Cyst of kidney, acquired: Secondary | ICD-10-CM | POA: Diagnosis not present

## 2020-01-12 DIAGNOSIS — T82837D Hemorrhage of cardiac prosthetic devices, implants and grafts, subsequent encounter: Secondary | ICD-10-CM | POA: Diagnosis not present

## 2020-01-12 DIAGNOSIS — R079 Chest pain, unspecified: Secondary | ICD-10-CM | POA: Diagnosis not present

## 2020-01-12 DIAGNOSIS — N131 Hydronephrosis with ureteral stricture, not elsewhere classified: Secondary | ICD-10-CM | POA: Diagnosis present

## 2020-01-12 DIAGNOSIS — Z8249 Family history of ischemic heart disease and other diseases of the circulatory system: Secondary | ICD-10-CM

## 2020-01-12 LAB — BASIC METABOLIC PANEL
BUN/Creatinine Ratio: 15 (ref 10–24)
BUN: 46 mg/dL — ABNORMAL HIGH (ref 8–27)
CO2: 20 mmol/L (ref 20–29)
Calcium: 7.3 mg/dL — ABNORMAL LOW (ref 8.6–10.2)
Chloride: 98 mmol/L (ref 96–106)
Creatinine, Ser: 3.1 mg/dL — ABNORMAL HIGH (ref 0.76–1.27)
GFR calc Af Amer: 22 mL/min/{1.73_m2} — ABNORMAL LOW (ref >59)
GFR calc non Af Amer: 19 mL/min/{1.73_m2} — ABNORMAL LOW (ref >59)
Glucose: 114 mg/dL — ABNORMAL HIGH (ref 65–99)
Potassium: 4.8 mmol/L (ref 3.5–5.2)
Sodium: 127 mmol/L — ABNORMAL LOW (ref 134–144)

## 2020-01-12 LAB — CBC WITH DIFFERENTIAL/PLATELET
Basophils Absolute: 0 10*3/uL (ref 0.0–0.2)
Basos: 0 %
EOS (ABSOLUTE): 0.1 10*3/uL (ref 0.0–0.4)
Eos: 2 %
Hematocrit: 20 % — ABNORMAL LOW (ref 37.5–51.0)
Hemoglobin: 6.6 g/dL — CL (ref 13.0–17.7)
Lymphocytes Absolute: 0.9 10*3/uL (ref 0.7–3.1)
Lymphs: 26 %
MCH: 30.3 pg (ref 26.6–33.0)
MCHC: 33 g/dL (ref 31.5–35.7)
MCV: 92 fL (ref 79–97)
Monocytes Absolute: 0.8 10*3/uL (ref 0.1–0.9)
Monocytes: 24 %
Neutrophils Absolute: 1.7 10*3/uL (ref 1.4–7.0)
Neutrophils: 48 %
Platelets: 231 10*3/uL (ref 150–450)
RBC: 2.18 x10E6/uL — CL (ref 4.14–5.80)
RDW: 14.5 % (ref 11.6–15.4)
WBC: 3.5 10*3/uL (ref 3.4–10.8)

## 2020-01-12 LAB — POC OCCULT BLOOD, ED: Fecal Occult Bld: NEGATIVE

## 2020-01-12 LAB — PREPARE RBC (CROSSMATCH)

## 2020-01-12 MED ORDER — CARVEDILOL 3.125 MG PO TABS
3.1250 mg | ORAL_TABLET | Freq: Two times a day (BID) | ORAL | Status: DC
  Administered 2020-01-13 – 2020-01-18 (×11): 3.125 mg via ORAL
  Filled 2020-01-12 (×12): qty 1

## 2020-01-12 MED ORDER — TAMSULOSIN HCL 0.4 MG PO CAPS
0.4000 mg | ORAL_CAPSULE | Freq: Every day | ORAL | Status: DC
  Administered 2020-01-12 – 2020-01-17 (×6): 0.4 mg via ORAL
  Filled 2020-01-12 (×6): qty 1

## 2020-01-12 MED ORDER — FERROUS SULFATE 325 (65 FE) MG PO TABS
325.0000 mg | ORAL_TABLET | Freq: Every day | ORAL | Status: DC
  Administered 2020-01-13 – 2020-01-18 (×6): 325 mg via ORAL
  Filled 2020-01-12 (×6): qty 1

## 2020-01-12 MED ORDER — AMIODARONE HCL 200 MG PO TABS
200.0000 mg | ORAL_TABLET | Freq: Every day | ORAL | Status: DC
  Administered 2020-01-13 – 2020-01-18 (×6): 200 mg via ORAL
  Filled 2020-01-12 (×6): qty 1

## 2020-01-12 MED ORDER — TRAMADOL HCL 50 MG PO TABS
50.0000 mg | ORAL_TABLET | Freq: Four times a day (QID) | ORAL | Status: DC | PRN
  Filled 2020-01-12: qty 1

## 2020-01-12 MED ORDER — TAMSULOSIN HCL 0.4 MG PO CAPS: 0.4000 mg | ORAL_CAPSULE | Freq: Every day | ORAL | Status: DC

## 2020-01-12 MED ORDER — POLYETHYLENE GLYCOL 3350 17 G PO PACK: 17.0000 g | PACK | Freq: Every day | ORAL | Status: DC | PRN

## 2020-01-12 MED ORDER — SODIUM CHLORIDE 0.9% IV SOLUTION: Freq: Once | INTRAVENOUS | Status: DC

## 2020-01-12 MED ORDER — ACETAMINOPHEN 325 MG PO TABS
650.0000 mg | ORAL_TABLET | Freq: Four times a day (QID) | ORAL | Status: DC | PRN
  Administered 2020-01-16 (×3): 650 mg via ORAL
  Filled 2020-01-12 (×3): qty 2

## 2020-01-12 MED ORDER — DULOXETINE HCL 30 MG PO CPEP
30.0000 mg | ORAL_CAPSULE | Freq: Every day | ORAL | Status: DC
  Administered 2020-01-13 – 2020-01-18 (×6): 30 mg via ORAL
  Filled 2020-01-12 (×6): qty 1

## 2020-01-12 MED ORDER — SIMVASTATIN 20 MG PO TABS
20.0000 mg | ORAL_TABLET | Freq: Every day | ORAL | Status: DC
  Administered 2020-01-13 – 2020-01-17 (×5): 20 mg via ORAL
  Filled 2020-01-12 (×5): qty 1

## 2020-01-12 MED ORDER — LEVETIRACETAM 500 MG PO TABS
1000.0000 mg | ORAL_TABLET | Freq: Two times a day (BID) | ORAL | Status: DC
  Administered 2020-01-12 – 2020-01-18 (×12): 1000 mg via ORAL
  Filled 2020-01-12 (×12): qty 2

## 2020-01-12 MED ORDER — ACETAMINOPHEN 650 MG RE SUPP: 650.0000 mg | Freq: Four times a day (QID) | RECTAL | Status: DC | PRN

## 2020-01-12 MED ORDER — FUROSEMIDE 10 MG/ML IJ SOLN
20.0000 mg | Freq: Once | INTRAMUSCULAR | Status: AC
  Administered 2020-01-12: 20 mg via INTRAVENOUS
  Filled 2020-01-12: qty 2

## 2020-01-12 MED ORDER — DIPHENHYDRAMINE HCL 25 MG PO CAPS
25.0000 mg | ORAL_CAPSULE | Freq: Once | ORAL | Status: AC
  Administered 2020-01-12: 25 mg via ORAL
  Filled 2020-01-12: qty 1

## 2020-01-12 MED ORDER — FUROSEMIDE 40 MG PO TABS
40.0000 mg | ORAL_TABLET | Freq: Every day | ORAL | Status: DC
  Administered 2020-01-13: 40 mg via ORAL
  Filled 2020-01-12: qty 1

## 2020-01-12 MED ORDER — DIVALPROEX SODIUM ER 500 MG PO TB24
500.0000 mg | ORAL_TABLET | Freq: Every day | ORAL | Status: DC
  Administered 2020-01-12 – 2020-01-17 (×6): 500 mg via ORAL
  Filled 2020-01-12 (×7): qty 1

## 2020-01-12 MED ORDER — SODIUM CHLORIDE 0.9% IV SOLUTION: Freq: Once | INTRAVENOUS | Status: AC

## 2020-01-12 MED ORDER — PHENYTOIN SODIUM EXTENDED 100 MG PO CAPS
100.0000 mg | ORAL_CAPSULE | Freq: Two times a day (BID) | ORAL | Status: DC
  Administered 2020-01-12 – 2020-01-18 (×12): 100 mg via ORAL
  Filled 2020-01-12 (×13): qty 1

## 2020-01-12 MED ORDER — ACETAMINOPHEN 325 MG PO TABS
650.0000 mg | ORAL_TABLET | Freq: Once | ORAL | Status: AC
  Administered 2020-01-12: 650 mg via ORAL
  Filled 2020-01-12: qty 2

## 2020-01-12 NOTE — Consult Note (Signed)
Urology Consult Note   Requesting Attending Physician:  Jani Gravel, MD Service Providing Consult: Urology  Consulting Attending: Dr. Jani Gravel  Reason for Consult:  hematuria  HPI: [facility]. is seen in consultation for reasons noted above at the request of Jani Gravel, MD for evaluation of hematuria.  This is a 74 y.o. male with history of hypertension, hyperlipidemia, CAD status post CABG, CHF, A. 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  Hematuria began on 12/10/2019 and he was seen by Dr. [name] in Urology clinic on 01/06/2020.  Urine culture grew Serratia he was prescribed Bactrim (also sensitive to quinolones).  Plan was for cystoscopy and CT urogram.  Then presented to the emergency room 01/11/2019 after having persistent hematuria and found to be anemic to 6.3 at his doctor's office.  Upon evaluation hemoglobin was 6.6.  Creatinine 3.1 from 1.05 in December 2020.  Was having significant hematuria but states he was able to empty his bladder well.  No flank pain.  PVR pending.  Denies any prior urologic interventions.  Was admitted in April 2020 with a large retroperitoneal hematoma that occurred and then resolved without intervention.  Urology was not consulted at that time.  Dr. [name] is his urologist  Past Medical History: Past Medical History:  Diagnosis Date  . AICD (automatic cardioverter/defibrillator) present   . Atrial fibrillation   09/22/2012  . Blood loss anemia 04/18/2017   After GI bleed from colonoscopy and polypectomy  . CAD (coronary artery disease)   . Chronic systolic heart failure ([place])   . 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 Factor VII deficiency ([place]) 10/07/2012  . GIB (gastrointestinal bleeding) 02/20/2017  . History of MRSA infection 05/2011  . HTN (hypertension)   . Hx of adenomatous colonic polyps 02/20/2017   01/2017 - 3 cm sigmoid TV adenoma and other smaller polyps -  had post-polypectomy bleed Tx w/ clips Consider repeat colonoscopy 3 yrs [name], MD, [name]   . Hyperlipidemia   . implantable cardiac defibrillator-Biotronik    Device Implanted 2006; s/p gen change 03/2011 : bleeding persistent with pocket erosion and infection; explant and reimplant  06/2011  . Ischemic cardiomyopathy    EF 15 to 20% by TTE and TEE in 09/2012.  Severe LV dysfunction  . Obesity    BMI 31 in 09/2012  . Persistent atrial fibrillation with rapid ventricular response ([place]) 04/21/2014  . Retroperitoneal bleed 04/21/2019  . Seizure disorder ([place]) latest 09/30/2012  . Seizures ([place])   . Stroke [facility])     Past Surgical History:  Past Surgical History:  Procedure Laterality Date  . ALVEOLOPLASTY N/A 07/26/2019   Procedure: ALVEOLOPLASTY WITH RESUTURING OF ORAL WOUND;  Surgeon: [name], [name];  Location: [facility] ORS;  Service: Oral Surgery;  Laterality: N/A;  . CARDIAC CATHETERIZATION N/A 10/12/2015   Procedure: Right Heart Cath;  Surgeon: [name], MD;  Location: [place] CV LAB;  Service: Cardiovascular;  Laterality: N/A;  . CARDIOVERSION  09/24/2012   Procedure: CARDIOVERSION;  Surgeon: [name], MD;  Location: [facility] ENDOSCOPY;  Service: Cardiovascular;  Laterality: N/A;  . CARDIOVERSION N/A 12/25/2017   Procedure: CARDIOVERSION;  Surgeon: [name], MD;  Location: [facility]S;  Service: Cardiovascular;  Laterality: N/A;  . COLONOSCOPY W/ POLYPECTOMY  02/13/2017  . CORONARY ARTERY BYPASS GRAFT  2011   in [place] 08/12/2019   Procedure: CAUTERIZATION OF ORAL BLEEDING;  Surgeon: [name], [name];  Location: [facility];  Service: Oral Surgery;  Laterality: N/A;  . FLEXIBLE SIGMOIDOSCOPY N/A 02/20/2017   Procedure: FLEXIBLE SIGMOIDOSCOPY;  Surgeon: [name], MD;  Location: [facility] ENDOSCOPY;  Service: Endoscopy;  Laterality: N/A;  . FLEXIBLE SIGMOIDOSCOPY N/A 02/21/2017   Procedure: FLEXIBLE SIGMOIDOSCOPY;  Surgeon: [name], MD;  Location: [facility]  ENDOSCOPY;  Service: Endoscopy;  Laterality: N/A;  . ICD    . ICD GENERATOR CHANGEOUT N/A 09/10/2019   Procedure: [place];  Surgeon: [name], MD;  Location: [place] CV LAB;  Service: Cardiovascular;  Laterality: N/A;  . ICD LEAD REMOVAL Right 11/13/2019   Procedure: ICD EXTRACTION and Lead Extraction;  Surgeon: [name], MD;  Location: [place];  Service: Cardiovascular;  Laterality: Right;  Dr. Servando Snare for backup  . POCKET REVISION/RELOCATION N/A 09/17/2019   Procedure: POCKET REVISION/RELOCATION;  Surgeon: Thompson Grayer, MD;  Location: [place] CV LAB;  Service: Cardiovascular;  Laterality: N/A;  . SUBMANDIBULAR GLAND EXCISION N/A 08/08/2019   Procedure: suture of oral wounds;  Surgeon: [name], [name];  Location: [facility];  Service: Oral Surgery;  Laterality: N/A;  . TEE WITHOUT CARDIOVERSION  09/24/2012   Procedure: TRANSESOPHAGEAL ECHOCARDIOGRAM (TEE);  Surgeon: [name], MD;  Location: [place];  Service: Cardiovascular;  Laterality: N/A;  dave/anesth, dl, cindy/echo   . TEE WITHOUT CARDIOVERSION N/A 11/12/2019   Procedure: TRANSESOPHAGEAL ECHOCARDIOGRAM (TEE);  Surgeon: [name], MD;  Location: [facility] ENDOSCOPY;  Service: Cardiovascular;  Laterality: N/A;  . TEE WITHOUT CARDIOVERSION N/A 11/13/2019   Procedure: TRANSESOPHAGEAL ECHOCARDIOGRAM (TEE);  Surgeon: [name], MD;  Location: [facility] OR;  Service: Cardiovascular;  Laterality: N/A;    Medication: Current Facility-Administered Medications  Medication Dose Route Frequency Provider Last Rate Last Admin  . 0.9 %  sodium chloride infusion (Manually program via Guardrails IV Fluids)   Intravenous Once C[place]avale, Sophia, PA-C      . 0.9 %  sodium chloride infusion (Manually program via Guardrails IV Fluids)   Intravenous Once Jani Gravel, MD      . acetaminophen (TYLENOL) tablet 650 mg  650 mg Oral Q6H PRN Jani Gravel, MD       Or  . acetaminophen (TYLENOL) suppository 650 mg  650 mg  Rectal Q6H PRN Jani Gravel, MD      . acetaminophen (TYLENOL) tablet 650 mg  650 mg Oral Once Jani Gravel, MD      . diphenhydrAMINE (BENADRYL) capsule 25 mg  25 mg Oral Once Jani Gravel, MD      . furosemide (LASIX) injection 20 mg  20 mg Intravenous Once Jani Gravel, MD       Current Outpatient Medications  Medication Sig Dispense Refill  . acetaminophen (TYLENOL) 325 MG tablet Take 650 mg by mouth every 6 (six) hours as needed for mild pain.    [name] amiodarone (PACERONE) 200 MG tablet Take 1 tablet by mouth once daily (Patient taking differently: Take 200 mg by mouth daily. ) 30 tablet 0  . carvedilol (COREG) 3.125 MG tablet Take 1 tablet (3.125 mg total) by mouth 2 (two) times daily with a meal. 60 tablet 1  . divalproex (DEPAKOTE ER) 500 MG 24 hr tablet Take 1 tablet every night (Patient taking differently: Take 500 mg by mouth at bedtime. ) 30 tablet 11  . DULoxetine (CYMBALTA) 30 MG capsule Take 1 capsule by mouth once daily 90 capsule 1  . ferrous sulfate 325 (65 FE) MG tablet  Take 1 tablet (325 mg total) by mouth 2 (two) times daily with a meal. (Patient taking differently: Take 325 mg by mouth daily with breakfast. ) 60 tablet 0  . furosemide (LASIX) 40 MG tablet Take 1 tablet by mouth once daily 90 tablet 3  . levETIRAcetam (KEPPRA) 1000 MG tablet Take 1 tablet (1,000 mg total) by mouth 2 (two) times daily. 60 tablet 11  . phenytoin (DILANTIN) 100 MG ER capsule Take 1 capsule (100 mg total) by mouth 2 (two) times daily. 60 capsule 11  . polyethylene glycol (MIRALAX / GLYCOLAX) 17 g packet Take 17 g by mouth daily as needed for mild constipation or moderate constipation. 14 each 0  . simvastatin (ZOCOR) 20 MG tablet TAKE 1 TABLET BY MOUTH ONCE DAILY AT 6PM (Patient taking differently: Take 20 mg by mouth daily at 6 PM. ) 90 tablet 1  . sulfamethoxazole-trimethoprim (BACTRIM DS) 800-160 MG tablet Take 1 tablet by mouth 2 (two) times daily. 10 tablet 0  . tamsulosin (FLOMAX) 0.4 MG CAPS capsule  Take 1 capsule by mouth once daily (Patient taking differently: Take 0.4 mg by mouth daily. ) 90 capsule 0  . traMADol (ULTRAM) 50 MG tablet Take 1 tablet (50 mg total) by mouth every 6 (six) hours as needed for moderate pain. for pain 20 tablet 0  . feeding supplement, ENSURE ENLIVE, (ENSURE ENLIVE) LIQD Take 237 mLs by mouth 3 (three) times daily between meals. (Patient not taking: Reported on 01/12/2020) 237 mL 12    Allergies: No Known Allergies  Social History: Social History   Tobacco Use  . Smoking status: Former Smoker    Types: Pipe    Quit date: 09/21/2008    Years since quitting: 11.3  . Smokeless tobacco: Former Systems developer    Quit date: 09/21/2008  Substance Use Topics  . Alcohol use: No  . Drug use: No    Family History Family History  Problem Relation Age of Onset  . Arthritis Mother   . Heart disease Mother   . Heart attack Mother   . Other Father        smoker  . Hypertension Neg Hx        unknown  . Stroke Neg Hx        unknown    Review of Systems 10 systems were reviewed and are negative except as noted specifically in the HPI.  Objective   Vital signs in last 24 hours: BP (!) 111/56   Pulse (!) 32   Resp 18   SpO2 94%   Physical Exam General Appearance:  No acute distress.  Pulmonary: Normal respiratory effort on room air Cardiovascular: Regular rate Abdomen: Soft, non-tender, without masses. Musculoskeletal: Extremities without edema. GU: No CVA or SP tenderness. Circumcised phallus.  Neurologic:  No motor abnormalities noted.    Most Recent Labs: Lab Results  Component Value Date   WBC 3.5 01/12/2020   HGB 6.6 (LL) 01/12/2020   HCT 20.0 (L) 01/12/2020   PLT 231 01/12/2020    Lab Results  Component Value Date   NA 127 (L) 01/12/2020   K 4.8 01/12/2020   CL 98 01/12/2020   CO2 20 01/12/2020   BUN 46 (H) 01/12/2020   CREATININE 3.10 (H) 01/12/2020   CALCIUM 7.3 (L) 01/12/2020   MG 1.8 11/13/2019   PHOS 3.4 08/16/2019    Lab  Results  Component Value Date   INR 1.3 (H) 12/29/2019   APTT 50 (H) 12/29/2019  01/07/19 UCx   IMAGING: DG Chest 2 View  Result Date: 01/12/2020 CLINICAL DATA:  Chest pain, shortness of breath and dizziness. EXAM: CHEST - 2 VIEW COMPARISON:  November 13, 2019 FINDINGS: Multiple sternal wires and vascular clips are seen. Radiopaque surgical clips are again seen overlying the perihilar region on the left. Mild, stable scarring and/or atelectasis is seen within the retrocardiac region of the left lung base. There is no evidence of a pleural effusion or pneumothorax. The heart size and mediastinal contours are within normal limits. No acute osseous abnormalities are seen involving the visualized skeletal structures. IMPRESSION: 1. Evidence of prior median sternotomy/CABG. 2. Mild, stable left basilar scarring and/or atelectasis. Electronically Signed   By: Virgina Norfolk M.D.   On: 01/12/2020 20:01   CT Renal Stone Study  Result Date: 01/12/2020 CLINICAL DATA:  Hematuria EXAM: CT ABDOMEN AND PELVIS WITHOUT CONTRAST TECHNIQUE: Multidetector CT imaging of the abdomen and pelvis was performed following the standard protocol without IV contrast. COMPARISON:  CT 04/21/2019 FINDINGS: Lower chest: Lung bases demonstrate no acute consolidation or pleural effusion. Borderline cardiomegaly. Coarse pericardial calcification. Hepatobiliary: No focal hepatic abnormality. Increased intraluminal density within the gallbladder. No biliary dilatation Pancreas: Unremarkable. No pancreatic ductal dilatation or surrounding inflammatory changes. Spleen: Normal in size without focal abnormality. Adrenals/Urinary Tract: Adrenal glands are normal. Bilateral renal cystic disease with some of the cysts containing peripheral calcification and possible septa. New moderate right hydronephrosis. New hydroureter. Hyperdensity within the distended right ureter extending from the UPJ to the distal ureter at approximate S1 level  after which the ureter becomes decompressed. No calcified stone. The bladder is unremarkable. Stomach/Bowel: Stomach is within normal limits. Appendix appears normal. No evidence of bowel wall thickening, distention, or inflammatory changes. Vascular/Lymphatic: Moderate severe aortic atherosclerosis without aneurysm. No significant adenopathy Reproductive: Prostate is unremarkable. Other: Negative for free air or free fluid. Musculoskeletal: Interval age indeterminate mild superior endplate fracture at L1. IMPRESSION: 1. Interim development of moderate right hydronephrosis and proximal hydroureter. Hyperdense material expanding the right ureter from the UPJ to the distal ureter at the level of upper sacrum, suspicious for blood clot/hemorrhagic material. No definitive stone disease. Findings could be secondary to intraureteral hemorrhage secondary to trauma, underlying neoplasm, or anticoagulation. 2. Increased intraluminal density within the gallbladder, possible sludge or small stones 3. Bilateral renal cystic disease 4. Interval age indeterminate mild superior endplate deformity at L1 Electronically Signed   By: Donavan Foil M.D.   On: 01/12/2020 21:14    ------  Assessment:  74 year old unhealthy male with multiple comorbidities, multiple renal cysts bilaterally and a bleeding disorder that has caused spontaneous hemorrhage in the past.  One of his right-sided renal cysts is likely bleeding and is causing clot obstruction of the right ureter system.  However, the rise in his creatinine is likely multifactorial from acute blood loss anemia, concurrent Bactrim use (unilateral obstruction does not normally raise Cr). The ureteral clot should pass on their own to relieve right ureteral obstruction but should the patients creatinine worsen he may need a ureteral stent.  Ureteral stent has the downside of worsening hematuria with bladder irritation in this coagulopathic patient.  From a voiding perspective  we need to make sure he is emptying well and not in clot retention.  Would hold off on Foley catheter placement if possible given coagulopathy  Recommendations: -Start Flomax, 0.4 mg daily to help with clot passage in the ureter  -Stop Bactrim, start antibiotics based on 01/07/2019 culture, copied above in  lab section   - Trend BMP. If creatinine continues to worsen can consider right ureteral stent placement  - N.p.o. at midnight in case he needs intervention tomorrow  -Once creatinine stabilizes will need contrasted CT versus MRI to evaluate upper tract more closely.    - Still needs outpatient cystoscopy to complete hematuria work-up.  Already scheduled for 01/20/2020 with Dr. [name]  - Urologic plan of care discussed with his wife  Thank you for this consult. Please contact the urology consult pager with any further questions/concerns.

## 2020-01-12 NOTE — ED Notes (Signed)
Blood initiated per Mar and verified with Aimee, RN at bedside. Pt alert, in NAD, no s/s of transfusion reaction noted at this time. VSS

## 2020-01-12 NOTE — ED Notes (Signed)
All meds given per [facility]. Name/DOB verified with pt. Warm blanket provided

## 2020-01-12 NOTE — ED Triage Notes (Signed)
Pt has bright red bleeding with urination. Has extensive hx--  Had labs drawn at dr's office today-- HGB = 6.3

## 2020-01-12 NOTE — Patient Instructions (Signed)
Have labs drawn immediately after visit.

## 2020-01-12 NOTE — H&P (Addendum)
TRH H&P    Patient Demographics:    Louis Ford, is a 74 y.o. male  MRN: [id number]  DOB - Jan 26, 1946  Admit Date - 01/12/2020  Referring MD/NP/PA:  Franchot Heidelberg  Outpatient Primary MD for the patient is [name], MD [facility] - oncology  Patient coming from:  home  Chief complaint- anemia   HPI:    Louis Ford  is a 74 y.o. 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  Pt was apparently taking Bactrim for ? UTI.  Pt denies fever, chills, n/v, diarrhea, brbpr, black stool.    In ED,  T afebrile, P 79 R 18, Bp 98/71  Pox 94% on RA  Wbc 3.5, Hgb 6.6, Plt 231 Glucose 114, Bun 46, Creatinine 3.10 Na 127 K 4.8  Urinalysis pending  CXR IMPRESSION: 1. Evidence of prior median sternotomy/CABG. 2. Mild, stable left basilar scarring and/or atelectasis.  CT renal stone study IMPRESSION: 1. Interim development of moderate right hydronephrosis and proximal hydroureter. Hyperdense material expanding the right ureter from the UPJ to the distal ureter at the level of upper sacrum, suspicious for blood clot/hemorrhagic material. No definitive stone disease.  Findings could be secondary to intraureteral hemorrhage secondary to trauma, underlying neoplasm, or anticoagulation. 2. Increased intraluminal density within the gallbladder, possible sludge or small stones  3. Bilateral renal cystic disease 4. Interval age indeterminate mild superior endplate deformity at L1  Pt will be admitted for anemia, and ARF        Review of systems:    In addition to the HPI above,  No Fever-chills, No Headache, No changes with Vision or hearing, No problems swallowing food or  Liquids, No Chest pain, Cough or Shortness of Breath, No Abdominal pain, No Nausea or Vomiting, bowel movements are regular, No Blood in stool or Urine, No dysuria, No new skin rashes or bruises, No new joints pains-aches,  No new weakness, tingling, numbness in any extremity, No recent weight gain or loss, No polyuria, polydypsia or polyphagia, No significant Mental Stressors.  All other systems reviewed and are negative.    Past History of the following :    Past Medical History:  Diagnosis Date  . AICD (automatic cardioverter/defibrillator) present   . Atrial fibrillation   09/22/2012  . Blood loss anemia 04/18/2017   After GI bleed from colonoscopy and polypectomy  . CAD (coronary artery disease)   . Chronic systolic heart failure ([place])   . 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 GIB (gastrointestinal bleeding) 02/20/2017  . History of MRSA infection 05/2011  . HTN (hypertension)   . Hx of adenomatous colonic polyps 02/20/2017   01/2017 - 3 cm sigmoid TV adenoma and other smaller polyps - had post-polypectomy bleed Tx w/ clips Consider repeat colonoscopy 3  yrs [name], MD, [name]   . Hyperlipidemia   . implantable cardiac defibrillator-Biotronik    Device Implanted 2006; s/p gen change 03/2011 : bleeding persistent with pocket erosion and infection; explant and reimplant  06/2011  . Ischemic cardiomyopathy    EF 15 to 20% by TTE and TEE in 09/2012.  Severe LV dysfunction  . Obesity    BMI 31 in 09/2012  . Persistent atrial fibrillation with rapid ventricular response ([place]) 04/21/2014  . Retroperitoneal bleed 04/21/2019  . Seizure disorder ([place]) latest 09/30/2012  . Seizures ([place])   . Stroke [facility])       Past Surgical History:  Procedure Laterality Date  . ALVEOLOPLASTY N/A 07/26/2019   Procedure: ALVEOLOPLASTY WITH RESUTURING OF ORAL WOUND;  Surgeon: [name], [name];  Location: [facility] ORS;  Service: Oral Surgery;  Laterality:  N/A;  . CARDIAC CATHETERIZATION N/A 10/12/2015   Procedure: Right Heart Cath;  Surgeon: [name], MD;  Location: [place] CV LAB;  Service: Cardiovascular;  Laterality: N/A;  . CARDIOVERSION  09/24/2012   Procedure: CARDIOVERSION;  Surgeon: [name], MD;  Location[facility]COPY;  Service: Cardiovascular;  Laterality: N/A;  . CARDIOVERSION N/A 12/25/2017   Procedure: CARDIOVERSION;  Surgeon: [name], MD;  Location: [facility] ORS;  Service: Cardiovascular;  Laterality: N/A;  . COLONOSCOPY W/ POLYPECTOMY  02/13/2017  . CORONARY ARTERY BYPASS GRAFT  2011   in [place] N/A 08/12/2019   Procedure: CAUTERIZATION OF ORAL BLEEDING;  Surgeon: [name], [name];  Location: [facility];  Service: Oral Surgery;  Laterality: N/A;  . FLEXIBLE SIGMOIDOSCOPY N/A 02/20/2017   Procedure: FLEXIBLE SIGMOIDOSCOPY;  Surgeon: [name], MD;  Location: [facility] ENDOSCOPY;  Service: Endoscopy;  Laterality: N/A;  . FLEXIBLE SIGMOIDOSCOPY N/A 02/21/2017   Procedure: FLEXIBLE SIGMOIDOSCOPY;  Surgeon: [name], MD;  Location: [facility] ENDOSCOPY;  Service: Endoscopy;  Laterality: N/A;  . ICD    . ICD GENERATOR CHANGEOUT N/A 09/10/2019   Procedure: [place];  Surgeon: [name], MD;  Location: [place] CV LAB;  Service: Cardiovascular;  Laterality: N/A;  . ICD LEAD REMOVAL Right 11/13/2019   Procedure: ICD EXTRACTION and Lead Extraction;  Surgeon: [name], MD;  Location: [place];  Service: Cardiovascular;  Laterality: Right;  Dr. Servando Snare for backup  . POCKET REVISION/RELOCATION N/A 09/17/2019   Procedure: POCKET REVISION/RELOCATION;  Surgeon: Thompson Grayer, MD;  Location: [place] CV LAB;  Service: Cardiovascular;  Laterality: N/A;  . SUBMANDIBULAR GLAND EXCISION N/A 08/08/2019   Procedure: suture of oral wounds;  Surgeon: [name], [name];  Location: [facility]S;  Service: Oral Surgery;  Laterality: N/A;  . TEE WITHOUT CARDIOVERSION  09/24/2012   Procedure: TRANSESOPHAGEAL  ECHOCARDIOGRAM (TEE);  Surgeon: [name], MD;  Location: [place];  Service: Cardiovascular;  Laterality: N/A;  dave/anesth, dl, cindy/echo   . TEE WITHOUT CARDIOVERSION N/A 11/12/2019   Procedure: TRANSESOPHAGEAL ECHOCARDIOGRAM (TEE);  Surgeon: [name], MD;  Location: [facility] ENDOSCOPY;  Service: Cardiovascular;  Laterality: N/A;  . TEE WITHOUT CARDIOVERSION N/A 11/13/2019   Procedure: TRANSESOPHAGEAL ECHOCARDIOGRAM (TEE);  Surgeon: [name], MD;  Location: [facility] OR;  Service: Cardiovascular;  Laterality: N/A;      Social History:      Social History   Tobacco Use  . Smoking status: Former Smoker    Types: Pipe    Quit date: 09/21/2008    Years since quitting: 11.3  . Smokeless tobacco: Former Systems developer  Quit date: 09/21/2008  Substance Use Topics  . Alcohol use: No       Family History :     Family History  Problem Relation Age of Onset  . Arthritis Mother   . Heart disease Mother   . Heart attack Mother   . Other Father        smoker  . Hypertension Neg Hx        unknown  . Stroke Neg Hx        unknown       Home Medications:   Prior to Admission medications   Medication Sig Start Date End Date Taking? Authorizing Provider  acetaminophen (TYLENOL) 325 MG tablet Take 650 mg by mouth every 6 (six) hours as needed for mild pain.    [provider]  amiodarone (PACERONE) 200 MG tablet Take 1 tablet by mouth once daily Patient taking differently: Take 200 mg by mouth daily.  05/21/19   [name], [name], MD  carvedilol (COREG) 3.125 MG tablet Take 1 tablet (3.125 mg total) by mouth 2 (two) times daily with a meal. 08/16/19 10/15/19  Marylyn Ishihara, Tyrone A, DO  divalproex (DEPAKOTE ER) 500 MG 24 hr tablet Take 1 tablet every night Patient taking differently: Take 500 mg by mouth at bedtime.  01/06/19   [name], MD  DULoxetine (CYMBALTA) 30 MG capsule Take 1 capsule by mouth once daily 10/13/19   [name], MD  feeding  supplement, ENSURE ENLIVE, (ENSURE ENLIVE) LIQD Take 237 mLs by mouth 3 (three) times daily between meals. 11/20/19   Daune Perch, NP  ferrous sulfate 325 (65 FE) MG tablet Take 1 tablet (325 mg total) by mouth 2 (two) times daily with a meal. Patient taking differently: Take 325 mg by mouth daily with breakfast.  08/16/19 09/28/19  Cherylann Ratel A, DO  furosemide (LASIX) 40 MG tablet Take 1 tablet by mouth once daily 12/03/19   [name], MD  levETIRAcetam (KEPPRA) 1000 MG tablet Take 1 tablet (1,000 mg total) by mouth 2 (two) times daily. 01/06/19   [name], MD  phenytoin (DILANTIN) 100 MG ER capsule Take 1 capsule (100 mg total) by mouth 2 (two) times daily. 01/06/19   [name], MD  polyethylene glycol (MIRALAX / GLYCOLAX) 17 g packet Take 17 g by mouth daily as needed for mild constipation or moderate constipation. 11/20/19   Daune Perch, NP  simvastatin (ZOCOR) 20 MG tablet TAKE 1 TABLET BY MOUTH ONCE DAILY AT 6PM Patient taking differently: Take 20 mg by mouth daily at 6 PM.  06/19/19   [name], MD  sulfamethoxazole-trimethoprim (BACTRIM DS) 800-160 MG tablet Take 1 tablet by mouth 2 (two) times daily. 12/11/19   [name], MD  tamsulosin (FLOMAX) 0.4 MG CAPS capsule Take 1 capsule by mouth once daily Patient taking differently: Take 0.4 mg by mouth daily.  07/17/19   Marrian Salvage, FNP  traMADol (ULTRAM) 50 MG tablet Take 1 tablet (50 mg total) by mouth every 6 (six) hours as needed for moderate pain. for pain 10/29/19   [name], MD     Allergies:    No Known Allergies   Physical Exam:   Vitals  Blood pressure 98/71, pulse 79, resp. rate 18, SpO2 94 %.  1.  General: axoxo3  2. Psychiatric: euthymic  3. Neurologic: Nonfocal, cn2-12 intact, reflexes 2+ symmetric, diffuse with no clonus, motor 5/5 in all 4 ext  4. HEENMT:  Anicteric, pale conjunctiva, pupils 1.24m symmetric, direct, consensual intact Neck:  no jvd  5. Respiratory : CTAB  6. Cardiovascular : rrr s1, s2, no m/g/r  7. Gastrointestinal:  Abd: soft, nt, nd, +bs  8. Skin:  Ext: no c/c/e, no rash  9.Musculoskeletal:  Good ROM    Data Review:    CBC Recent Labs  Lab 01/12/20 1149  WBC 3.5  HGB 6.6*  HCT 20.0*  PLT 231  MCV 92  MCH 30.3  MCHC 33.0  RDW 14.5  LYMPHSABS 0.9  EOSABS 0.1  BASOSABS 0.0   ------------------------------------------------------------------------------------------------------------------  Results for orders placed or performed in visit on 01/12/20 (from the past 48 hour(s))  CBC w/Diff     Status: Abnormal   Collection Time: 01/12/20 11:49 AM  Result Value Ref Range   WBC 3.5 3.4 - 10.8 x10E3/uL   RBC 2.18 (LL) 4.14 - 5.80 x10E6/uL   Hemoglobin 6.6 (LL) 13.0 - 17.7 g/dL    Comment:                   Client Requested Flag   Hematocrit 20.0 (L) 37.5 - 51.0 %   MCV 92 79 - 97 fL   MCH 30.3 26.6 - 33.0 pg   MCHC 33.0 31.5 - 35.7 g/dL   RDW 14.5 11.6 - 15.4 %   Platelets 231 150 - 450 x10E3/uL   Neutrophils 48 Not Estab. %   Lymphs 26 Not Estab. %   Monocytes 24 Not Estab. %   Eos 2 Not Estab. %   Basos 0 Not Estab. %   Neutrophils Absolute 1.7 1.4 - 7.0 x10E3/uL   Lymphocytes Absolute 0.9 0.7 - 3.1 x10E3/uL   Monocytes Absolute 0.8 0.1 - 0.9 x10E3/uL   EOS (ABSOLUTE) 0.1 0.0 - 0.4 x10E3/uL   Basophils Absolute 0.0 0.0 - 0.2 x10E3/uL   Hematology Comments: Comment     Comment: Verified by microscopic examination.  Basic Metabolic Panel (BMET)     Status: Abnormal   Collection Time: 01/12/20 11:49 AM  Result Value Ref Range   Glucose 114 (H) 65 - 99 mg/dL   BUN 46 (H) 8 - 27 mg/dL   Creatinine, Ser 3.10 (H) 0.76 - 1.27 mg/dL   GFR calc non Af Amer 19 (L) >59 mL/min/1.73   GFR calc Af Amer 22 (L) >59 mL/min/1.73   BUN/Creatinine Ratio 15 10 - 24   Sodium 127 (L) 134 - 144 mmol/L   Potassium 4.8 3.5 - 5.2 mmol/L   Chloride 98 96 - 106 mmol/L   CO2 20 20 - 29 mmol/L    Calcium 7.3 (L) 8.6 - 10.2 mg/dL    Chemistries  Recent Labs  Lab 01/12/20 1149  NA 127*  K 4.8  CL 98  CO2 20  GLUCOSE 114*  BUN 46*  CREATININE 3.10*  CALCIUM 7.3*   ------------------------------------------------------------------------------------------------------------------  ------------------------------------------------------------------------------------------------------------------ GFR: CrCl cannot be calculated (Unknown ideal weight.). Liver Function Tests: No results for input(s): AST, ALT, ALKPHOS, BILITOT, PROT, ALBUMIN in the last 168 hours. No results for input(s): LIPASE, AMYLASE in the last 168 hours. No results for input(s): AMMONIA in the last 168 hours. Coagulation Profile: No results for input(s): INR, PROTIME in the last 168 hours. Cardiac Enzymes: No results for input(s): CKTOTAL, CKMB, CKMBINDEX, TROPONINI in the last 168 hours. BNP (last 3 results) No results for input(s): PROBNP in the last 8760 hours. HbA1C: No results for input(s): HGBA1C in the last 72 hours. CBG: No results for input(s):  GLUCAP in the last 168 hours. Lipid Profile: No results for input(s): CHOL, HDL, LDLCALC, TRIG, CHOLHDL, LDLDIRECT in the last 72 hours. Thyroid Function Tests: No results for input(s): TSH, T4TOTAL, FREET4, T3FREE, THYROIDAB in the last 72 hours. Anemia Panel: No results for input(s): VITAMINB12, FOLATE, FERRITIN, TIBC, IRON, RETICCTPCT in the last 72 hours.  --------------------------------------------------------------------------------------------------------------- Urine analysis:    Component Value Date/Time   COLORURINE RED (A) 12/21/2019 2341   APPEARANCEUR TURBID (A) 12/21/2019 2341   LABSPEC  12/21/2019 2341    TEST NOT REPORTED DUE TO COLOR INTERFERENCE OF URINE PIGMENT   PHURINE  12/21/2019 2341    TEST NOT REPORTED DUE TO COLOR INTERFERENCE OF URINE PIGMENT   GLUCOSEU (A) 12/21/2019 2341    TEST NOT REPORTED DUE TO COLOR INTERFERENCE  OF URINE PIGMENT   GLUCOSEU NEGATIVE 12/11/2019 1134   HGBUR (A) 12/21/2019 2341    TEST NOT REPORTED DUE TO COLOR INTERFERENCE OF URINE PIGMENT   BILIRUBINUR (A) 12/21/2019 2341    TEST NOT REPORTED DUE TO COLOR INTERFERENCE OF URINE PIGMENT   BILIRUBINUR Neg 11/27/2017 1032   KETONESUR (A) 12/21/2019 2341    TEST NOT REPORTED DUE TO COLOR INTERFERENCE OF URINE PIGMENT   PROTEINUR (A) 12/21/2019 2341    TEST NOT REPORTED DUE TO COLOR INTERFERENCE OF URINE PIGMENT   UROBILINOGEN 0.2 12/11/2019 1134   NITRITE (A) 12/21/2019 2341    TEST NOT REPORTED DUE TO COLOR INTERFERENCE OF URINE PIGMENT   LEUKOCYTESUR (A) 12/21/2019 2341    TEST NOT REPORTED DUE TO COLOR INTERFERENCE OF URINE PIGMENT      Imaging Results:    No results found.     Assessment & Plan:    Active Problems:   Anemia  Anemia likely due to gross hematuria  Transfuse 2 units prbc Lasix 11m iv x1 Continue ferrous slulfate Check cbc in am  Gross hematuria with moderate R hydro Check INR  Will get post-void residual and if > 200 will notify urology  NPO after [facility] consulted, appreciate input  ARF on CRF Likely due to bactrim vs worsening MM Pt will receive blood  Check cmp in am  Hyponatremia ? Secondary to Lasix Check serum osm, cortisol, tsh Check urine osm, urine sodium Check cmp in am  ? Multiple Myel[place] ([place] Consider oncology consultation, or f/u as outpatient  CHF (EF 45%) Cont Carvedilol 3.1255mpo bid Cont Lasix 4040mo qday  Pafib Cont Amiodarone 200m29m qday  CAD s/p CABG Cont Simvastatin 20mg59mqhs Cont Cavedilol as above  H/o stroke/ seizure disorder Cont Keppra 1000mg 89mid Cont Dilantin Cont Depakote ER   BPH Cont Flomax 0.4mg po83ms  ?Anxiety Cont Duloxetine  DVT Prophylaxis-  - SCDs   AM Labs Ordered, also please review Full Orders  Family Communication: Admission, patients condition and plan of care including tests being ordered have  been discussed with the patient who indicate understanding and agree with the plan and Code Status.  Code Status:  FULL CODE per patient, notified wife that patient admitted to [facility]  [facility] Obsevationt: Based on patients clinical presentation and evaluation of above clinical data, I have made determination that patient meets Observation criteria at this time.  Pt will require transfusion and possibly iv fluids for ARF on CRF, (creatinine 3.1),  Pt has hyponatremia, which may require further w/up.  Depending upon how renal function improves.  Pt may require > 2 nites stay.   Time spent in  minutes : 24   Jani Gravel M.D on 01/12/2020 at 7:56 PM

## 2020-01-12 NOTE — ED Notes (Signed)
Informed consent at bedside °

## 2020-01-12 NOTE — ED Provider Notes (Signed)
[facility] EMERGENCY DEPARTMENT Provider Note   CSN: [id number] Arrival date & time: 01/12/20  1802     History Chief Complaint  Patient presents with  . Hematuria  . sent by dr's office  . Anemia    [facility]. is a 74 y.o. male presenting for evaluation of hematuria, shortness of breath, and anemia.  Patient states he has had hematuria for the past 2 weeks.  He saw his urologist on Thursday, started on Bactrim.  For the past week and a half he has been short of breath.  This is present with exertion, no shortness of breath with rest.  He denies fevers, chills, chest pain, cough, nausea, vomiting, abdominal pain.  He denies change in bowel habits, denies melena or hematochezia.  He does report generalized weakness.  He denies sick contacts.  Additional history obtained from patient's wife.  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  She states he has been seen by GI in the past, but it was many years ago.  She is not sure if he has had a GI bleed before.  Wife states he has had multiple complications due to bleeding in the past 6 months.   Additional history obtained from chart review.  Patient with a history of A. fib, blood loss anemia after GI bleed from colonoscopy and polypectomy, CAD, CHF, factor VII deficiency, hypertension, seizure, stroke.   HPI     Past Medical History:  Diagnosis Date  . AICD (automatic cardioverter/defibrillator) present   . Atrial fibrillation   09/22/2012  . Blood loss anemia 04/18/2017   After GI bleed from colonoscopy and polypectomy  . CAD (coronary artery disease)   . Chronic systolic heart failure ([place])   . 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 GIB (gastrointestinal bleeding) 02/20/2017  . History of MRSA infection 05/2011  . HTN (hypertension)   . Hx of adenomatous colonic polyps 02/20/2017   01/2017 - 3 cm sigmoid TV adenoma and other smaller polyps - had post-polypectomy bleed Tx w/ clips Consider repeat colonoscopy 3 yrs [name], MD, [name]   . Hyperlipidemia   . implantable cardiac defibrillator-Biotronik    Device Implanted 2006; s/p gen change 03/2011 : bleeding persistent with pocket erosion and infection; explant and reimplant  06/2011  . Ischemic cardiomyopathy    EF 15 to 20% by TTE and TEE in 09/2012.  Severe LV dysfunction  . Obesity    BMI 31 in 09/2012  . Persistent atrial fibrillation with rapid ventricular response ([place]) 04/21/2014  . Retroperitoneal bleed 04/21/2019  . Seizure disorder ([place]) latest 09/30/2012  . Seizures ([place])   . Stroke [facility])     Patient Active Problem List   Diagnosis Date Noted  . Anemia 01/12/2020  . Gross hematuria 12/11/2019  . ICD (implantable cardioverter-defibrillator) pocket hematoma, subsequent encounter 11/09/2019  . Hematoma 10/29/2019  . Upper back pain 10/29/2019  . Nonischemic cardiomyopathy ([place]) 10/22/2019  . ICD (implantable cardioverter-defibrillator) pocket hematoma 09/17/2019  . Complications due to automatic implantable cardioverter-defibrillator   . Hospice care patient 08/05/2019  . Palliative care patient 08/05/2019  . Oral bleeding   . Coagulopathy ([place])   . Anemia due to blood loss, acute 07/20/2019  . Retroperitoneal bleed 04/21/2019  . Retroperitoneal hematoma   . Right arm pain 11/26/2018  . Hematoma of chest wall 03/26/2018  . Leg pain 03/04/2018  .  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 Elevated TSH 10/27/2017  . Weakness of both lower extremities 10/27/2017  . Unsteady gait 10/27/2017  . Localization-related symptomatic epilepsy and epileptic syndromes with complex partial seizures, not intractable, without status epilepticus ([place]) 10/08/2017  . Insomnia 09/02/2017  . Degenerative joint disease of knee, left 08/01/2017  . Blood loss anemia 04/18/2017  . Hx of adenomatous colonic polyps  02/20/2017  . Chronic pain of left knee 01/22/2017  . Chronic kidney disease (CKD), stage III (moderate) ([place]) 11/08/2016  . Hypertensive heart disease without heart failure   . Chronic chest wall pain 08/17/2016  . Periodontal disease 05/18/2016  . HLD (hyperlipidemia) 02/26/2016  . CVA (cerebral infarction) 02/26/2016  . Near syncope 02/26/2016  . Gout 12/30/2015  . Persistent atrial fibrillation with rapid ventricular response ([place]) 04/21/2014  . Chronic systolic CHF (congestive heart failure), NYHA class 2 ([place]) 11/06/2012  . 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 Seizure ([place]) 10/01/2012  . Renal insufficiency 10/01/2012  . Ischemic cardiomyopathy  ? additional rate component 09/29/2012  . Automatic implantable cardioverter-defibrillator in situ   . HTN (hypertension) 09/21/2012  . Coronary artery disease involving native heart 09/21/2012    Past Surgical History:  Procedure Laterality Date  . ALVEOLOPLASTY N/A 07/26/2019   Procedure: ALVEOLOPLASTY WITH RESUTURING OF ORAL WOUND;  Surgeon: [name], [name];  Location: [facility] ORS;  Service: Oral Surgery;  Laterality: N/A;  . CARDIAC CATHETERIZATION N/A 10/12/2015   Procedure: Right Heart Cath;  Surgeon: [name], MD;  Location: [place] CV LAB;  Service: Cardiovascular;  Laterality: N/A;  . CARDIOVERSION  09/24/2012   Procedure: CARDIOVERSION;  Surgeon: [name], MD;  Location: [facility] ENDOSCOPY;  Service: Cardiovascular;  Laterality: N/A;  . CARDIOVERSION N/A 12/25/2017   Procedure: CARDIOVERSION;  Surgeon: [name], MD;  Location: [facility] ORS;  Service: Cardiovascular;  Laterality: N/A;  . COLONOSCOPY W/ POLYPECTOMY  02/13/2017  . CORONARY ARTERY BYPASS GRAFT  2011   in [place] N/A 08/12/2019   Procedure: CAUTERIZATION OF ORAL BLEEDING;  Surgeon: [name], [name];  Location: [facility];  Service: Oral Surgery;  Laterality: N/A;  . FLEXIBLE SIGMOIDOSCOPY N/A 02/20/2017   Procedure: FLEXIBLE  SIGMOIDOSCOPY;  Surgeon: [name], MD;  Location[facility] END[facility]COPY;  Service: Endoscopy;  Laterality: N/A;  . FLEXIBLE SIGMOIDOSCOPY N/A 02/21/2017   Procedure: FLEXIBLE SIGMOIDOSCOPY;  Surgeon: [name], MD;  Location: [facility] ENDOSCOPY;  Service: Endoscopy;  Laterality: N/A;  . ICD    . ICD GENERATOR CHANGEOUT N/A 09/10/2019   Procedure: [place];  Surgeon: [name], MD;  Location: [place] CV LAB;  Service: Cardiovascular;  Laterality: N/A;  . ICD LEAD REMOVAL Right 11/13/2019   Procedure: ICD EXTRACTION and Lead Extraction;  Surgeon: [name], MD;  Location: [place];  Service: Cardiovascular;  Laterality: Right;  Dr. Servando Snare for backup  . POCKET REVISION/RELOCATION N/A 09/17/2019   Procedure: POCKET REVISION/RELOCATION;  Surgeon: Thompson Grayer, MD;  Location: [place] CV LAB;  Service: Cardiovascular;  Laterality: N/A;  . SUBMANDIBULAR GLAND EXCISION N/A 08/08/2019   Procedure: suture of oral wounds;  Surgeon: [name], [name];  Location: [facility];  Service: Oral Surgery;  Laterality: N/A;  . TEE WITHOUT CARDIOVERSION  09/24/2012   Procedure: TRANSESOPHAGEAL ECHOCARDIOGRAM (TEE);  Surgeon: [name], MD;  Location: [place];  Service: Cardiovascular;  Laterality: N/A;  dave/anesth, dl, cindy/echo   . TEE WITHOUT CARDIOVERSION N/A 11/12/2019   Procedure: TRANSESOPHAGEAL  ECHOCARDIOGRAM (TEE);  Surgeon: [name], MD;  Location: [facility] ENDOSCOPY;  Service: Cardiovascular;  Laterality: N/A;  . TEE WITHOUT CARDIOVERSION N/A 11/13/2019   Procedure: TRANSESOPHAGEAL ECHOCARDIOGRAM (TEE);  Surgeon: [name], MD;  Location: [facility] OR;  Service: Cardiovascular;  Laterality: N/A;       Family History  Problem Relation Age of Onset  . Arthritis Mother   . Heart disease Mother   . Heart attack Mother   . Other Father        smoker  . Hypertension Neg Hx        unknown  . Stroke Neg Hx        unknown    Social History   Tobacco Use  . Smoking  status: Former Smoker    Types: Pipe    Quit date: 09/21/2008    Years since quitting: 11.3  . Smokeless tobacco: Former Systems developer    Quit date: 09/21/2008  Substance Use Topics  . Alcohol use: No  . Drug use: No    Home Medications Prior to Admission medications   Medication Sig Start Date End Date Taking? Authorizing Provider  acetaminophen (TYLENOL) 325 MG tablet Take 650 mg by mouth every 6 (six) hours as needed for mild pain.   Yes [provider]  amiodarone (PACERONE) 200 MG tablet Take 1 tablet by mouth once daily Patient taking differently: Take 200 mg by mouth daily.  05/21/19  Yes [name], [name], MD  carvedilol (COREG) 3.125 MG tablet Take 1 tablet (3.125 mg total) by mouth 2 (two) times daily with a meal. 08/16/19 01/12/20 Yes Kyle, Tyrone A, DO  divalproex (DEPAKOTE ER) 500 MG 24 hr tablet Take 1 tablet every night Patient taking differently: Take 500 mg by mouth at bedtime.  01/06/19  Yes [name], MD  DULoxetine (CYMBALTA) 30 MG capsule Take 1 capsule by mouth once daily 10/13/19  Yes [name], MD  ferrous sulfate 325 (65 FE) MG tablet Take 1 tablet (325 mg total) by mouth 2 (two) times daily with a meal. Patient taking differently: Take 325 mg by mouth daily with breakfast.  08/16/19 01/12/20 Yes Marylyn Ishihara, Tyrone A, DO  furosemide (LASIX) 40 MG tablet Take 1 tablet by mouth once daily 12/03/19  Yes [name], MD  levETIRAcetam (KEPPRA) 1000 MG tablet Take 1 tablet (1,000 mg total) by mouth 2 (two) times daily. 01/06/19  Yes [name], MD  phenytoin (DILANTIN) 100 MG ER capsule Take 1 capsule (100 mg total) by mouth 2 (two) times daily. 01/06/19  Yes [name], MD  polyethylene glycol (MIRALAX / GLYCOLAX) 17 g packet Take 17 g by mouth daily as needed for mild constipation or moderate constipation. 11/20/19  Yes Daune Perch, NP  simvastatin (ZOCOR) 20 MG tablet TAKE 1 TABLET BY MOUTH ONCE DAILY AT 6PM Patient taking differently: Take 20  mg by mouth daily at 6 PM.  06/19/19  Yes [name], MD  sulfamethoxazole-trimethoprim (BACTRIM DS) 800-160 MG tablet Take 1 tablet by mouth 2 (two) times daily. 12/11/19  Yes [name], MD  tamsulosin (FLOMAX) 0.4 MG CAPS capsule Take 1 capsule by mouth once daily Patient taking differently: Take 0.4 mg by mouth daily.  07/17/19  Yes Marrian Salvage, FNP  traMADol (ULTRAM) 50 MG tablet Take 1 tablet (50 mg total) by mouth every 6 (six) hours as needed for moderate pain. for pain 10/29/19  Yes [name], MD  feeding supplement, ENSURE  ENLIVE, (ENSURE ENLIVE) LIQD Take 237 mLs by mouth 3 (three) times daily between meals. Patient not taking: Reported on 01/12/2020 11/20/19   Daune Perch, NP    Allergies    Patient has no known allergies.  Review of Systems   Review of Systems  Respiratory: Positive for shortness of breath.   Genitourinary: Positive for hematuria.  Neurological: Positive for weakness.  Hematological: Bruises/bleeds easily.  All other systems reviewed and are negative.   Physical Exam Updated Vital Signs BP (!) 111/56   Pulse (!) 32   Resp 18   SpO2 94%   Physical Exam Vitals and nursing note reviewed. Exam conducted with a chaperone present.  Constitutional:      General: He is not in acute distress.    Appearance: He is well-developed.  HENT:     Head: Normocephalic and atraumatic.  Eyes:     Extraocular Movements: Extraocular movements intact.     Conjunctiva/sclera: Conjunctivae normal.     Pupils: Pupils are equal, round, and reactive to light.  Cardiovascular:     Rate and Rhythm: Normal rate and regular rhythm.     Pulses: Normal pulses.  Pulmonary:     Effort: Pulmonary effort is normal. No respiratory distress.     Breath sounds: Normal breath sounds. No wheezing.  Abdominal:     General: There is no distension.     Palpations: Abdomen is soft. There is no mass.     Tenderness: There is no abdominal  tenderness. There is no guarding or rebound.  Genitourinary:    Comments: No gross bleeding noted on exam.  Hemoccult negative Musculoskeletal:        General: Normal range of motion.     Cervical back: Normal range of motion and neck supple.  Skin:    General: Skin is warm and dry.     Capillary Refill: Capillary refill takes less than 2 seconds.  Neurological:     Mental Status: He is alert and oriented to person, place, and time.     ED Results / Procedures / Treatments   Labs (all labs ordered are listed, but only abnormal results are displayed) Labs Reviewed  URINE CULTURE  SARS CORONAVIRUS 2 (TAT 6-24 HRS)  URINALYSIS, ROUTINE W REFLEX MICROSCOPIC  COMPREHENSIVE METABOLIC PANEL  CBC  POC OCCULT BLOOD, ED  TYPE AND SCREEN  PREPARE RBC (CROSSMATCH)    EKG EKG Interpretation  Date/Time:  Tuesday January 12 2020 18:18:01 EST Ventricular Rate:  80 PR Interval:  170 QRS Duration: 116 QT Interval:  424 QTC Calculation: 489 R Axis:   10 Text Interpretation: Sinus rhythm with occasional Premature ventricular complexes Incomplete left bundle branch block Nonspecific T wave abnormality Prolonged QT Abnormal ECG No significant change was found Confirmed by Ezequiel Essex [phone]) on 01/12/2020 7:04:05 PM   Radiology DG Chest 2 View  Result Date: 01/12/2020 CLINICAL DATA:  Chest pain, shortness of breath and dizziness. EXAM: CHEST - 2 VIEW COMPARISON:  November 13, 2019 FINDINGS: Multiple sternal wires and vascular clips are seen. Radiopaque surgical clips are again seen overlying the perihilar region on the left. Mild, stable scarring and/or atelectasis is seen within the retrocardiac region of the left lung base. There is no evidence of a pleural effusion or pneumothorax. The heart size and mediastinal contours are within normal limits. No acute osseous abnormalities are seen involving the visualized skeletal structures. IMPRESSION: 1. Evidence of prior median sternotomy/CABG.  2. Mild, stable left basilar scarring and/or atelectasis. Electronically Signed  By: Virgina Norfolk M.D.   On: 01/12/2020 20:01    Procedures .Critical Care Performed by: Franchot Heidelberg, PA-C Authorized by: Franchot Heidelberg, PA-C   Critical care provider statement:    Critical care time (minutes):  40   Critical care time was exclusive of:  Separately billable procedures and treating other patients and teaching time   Critical care was necessary to treat or prevent imminent or life-threatening deterioration of the following conditions:  Circulatory failure   Critical care was time spent personally by me on the following activities:  Blood draw for specimens, development of treatment plan with patient or surrogate, evaluation of patient's response to treatment, examination of patient, obtaining history from patient or surrogate, ordering and performing treatments and interventions, ordering and review of laboratory studies, ordering and review of radiographic studies, pulse oximetry, re-evaluation of patient's condition and review of old charts   I assumed direction of critical care for this patient from another provider in my specialty: no   Comments:     Patient with critically low hemoglobin requiring blood transfusion and admission to the hospital.   (including critical care time)  Medications Ordered in ED Medications  0.9 %  sodium chloride infusion (Manually program via Guardrails IV Fluids) (has no administration in time range)  acetaminophen (TYLENOL) tablet 650 mg (has no administration in time range)    Or  acetaminophen (TYLENOL) suppository 650 mg (has no administration in time range)    ED Course  I have reviewed the triage vital signs and the nursing notes.  Pertinent labs & imaging results that were available during my care of the patient were reviewed by me and considered in my medical decision making (see chart for details).   MDM Rules/Calculators/A&P                       Patient presenting for evaluation of weakness, hematuria, shortness of breath, and low blood counts.  Physical exam shows patient who appears slightly pale, but otherwise nontoxic.  No obvious source of bleeding other than hematuria.  Labs were obtained earlier today, will not repeat at this time.  Will obtain chest x-ray to rule out other causes for shortness of breath and CT renal due to AKI and hematuria.  Doubt ACS, as patient without chest pain.  Doubt PE in the setting of anemia.  Will call for admission.  Discussed with Dr. Maudie Mercury from triad hospitalist service, patient to be admitted.  Final Clinical Impression(s) / ED Diagnoses Final diagnoses:  Anemia, unspecified type  AKI (acute kidney injury) [facility])    Rx / [place] Orders ED Discharge Orders    None       Franchot Heidelberg, PA-C 01/12/20 2116    Ezequiel Essex, MD 01/13/20 [phone]

## 2020-01-12 NOTE — ED Notes (Signed)
Urologist at bedside.

## 2020-01-12 NOTE — Progress Notes (Signed)
Patient in for wound check.Wound edges approximated, no bleeding and no drainage. Stitch present at distal edge of incision clipped. Patient c/o SOB with all activity. He is being treated for hematuria and is on antibiotics for a UTI. Dr Lovena Le in to see patient. Patient assessed by Dr Lovena Le. CBC with diff and BMP ordered stat. ED precautions given to patient.

## 2020-01-12 NOTE — Telephone Encounter (Signed)
Confirmed patient is on the way to the ED. ED charge alerted to patient condition and given report.

## 2020-01-12 NOTE — Telephone Encounter (Signed)
Per Dr. Jarold Motto needs to go to ER  Call placed to Pt's wife.  Advised Pt needs to go to ER based on lab values.  Wife indicates understanding.  Will take Pt to ER.

## 2020-01-13 ENCOUNTER — Encounter (HOSPITAL_COMMUNITY): Payer: Self-pay | Admitting: Internal Medicine

## 2020-01-13 DIAGNOSIS — F419 Anxiety disorder, unspecified: Secondary | ICD-10-CM | POA: Diagnosis present

## 2020-01-13 DIAGNOSIS — C9000 Multiple myeloma not having achieved remission: Secondary | ICD-10-CM | POA: Diagnosis present

## 2020-01-13 DIAGNOSIS — N400 Enlarged prostate without lower urinary tract symptoms: Secondary | ICD-10-CM | POA: Diagnosis present

## 2020-01-13 DIAGNOSIS — N281 Cyst of kidney, acquired: Secondary | ICD-10-CM | POA: Diagnosis not present

## 2020-01-13 DIAGNOSIS — Z87891 Personal history of nicotine dependence: Secondary | ICD-10-CM | POA: Diagnosis not present

## 2020-01-13 DIAGNOSIS — Z20822 Contact with and (suspected) exposure to covid-19: Secondary | ICD-10-CM | POA: Diagnosis present

## 2020-01-13 DIAGNOSIS — Z86010 Personal history of colonic polyps: Secondary | ICD-10-CM | POA: Diagnosis not present

## 2020-01-13 DIAGNOSIS — I110 Hypertensive heart disease with heart failure: Secondary | ICD-10-CM | POA: Diagnosis present

## 2020-01-13 DIAGNOSIS — Z951 Presence of aortocoronary bypass graft: Secondary | ICD-10-CM | POA: Diagnosis not present

## 2020-01-13 DIAGNOSIS — I5022 Chronic systolic (congestive) heart failure: Secondary | ICD-10-CM | POA: Diagnosis present

## 2020-01-13 DIAGNOSIS — N131 Hydronephrosis with ureteral stricture, not elsewhere classified: Secondary | ICD-10-CM | POA: Diagnosis present

## 2020-01-13 DIAGNOSIS — E875 Hyperkalemia: Secondary | ICD-10-CM | POA: Diagnosis not present

## 2020-01-13 DIAGNOSIS — I480 Paroxysmal atrial fibrillation: Secondary | ICD-10-CM | POA: Diagnosis present

## 2020-01-13 DIAGNOSIS — Z8673 Personal history of transient ischemic attack (TIA), and cerebral infarction without residual deficits: Secondary | ICD-10-CM | POA: Diagnosis not present

## 2020-01-13 DIAGNOSIS — D682 Hereditary deficiency of other clotting factors: Secondary | ICD-10-CM | POA: Diagnosis present

## 2020-01-13 DIAGNOSIS — G40909 Epilepsy, unspecified, not intractable, without status epilepticus: Secondary | ICD-10-CM | POA: Diagnosis present

## 2020-01-13 DIAGNOSIS — R310 Gross hematuria: Secondary | ICD-10-CM | POA: Diagnosis not present

## 2020-01-13 DIAGNOSIS — N1330 Unspecified hydronephrosis: Secondary | ICD-10-CM | POA: Diagnosis not present

## 2020-01-13 DIAGNOSIS — D649 Anemia, unspecified: Secondary | ICD-10-CM

## 2020-01-13 DIAGNOSIS — N028 Recurrent and persistent hematuria with other morphologic changes: Secondary | ICD-10-CM | POA: Diagnosis present

## 2020-01-13 DIAGNOSIS — I255 Ischemic cardiomyopathy: Secondary | ICD-10-CM | POA: Diagnosis present

## 2020-01-13 DIAGNOSIS — D62 Acute posthemorrhagic anemia: Secondary | ICD-10-CM | POA: Diagnosis present

## 2020-01-13 DIAGNOSIS — E871 Hypo-osmolality and hyponatremia: Secondary | ICD-10-CM | POA: Diagnosis not present

## 2020-01-13 DIAGNOSIS — N179 Acute kidney failure, unspecified: Secondary | ICD-10-CM | POA: Diagnosis not present

## 2020-01-13 DIAGNOSIS — Q619 Cystic kidney disease, unspecified: Secondary | ICD-10-CM | POA: Diagnosis not present

## 2020-01-13 DIAGNOSIS — E785 Hyperlipidemia, unspecified: Secondary | ICD-10-CM | POA: Diagnosis present

## 2020-01-13 DIAGNOSIS — I2510 Atherosclerotic heart disease of native coronary artery without angina pectoris: Secondary | ICD-10-CM | POA: Diagnosis not present

## 2020-01-13 DIAGNOSIS — R319 Hematuria, unspecified: Secondary | ICD-10-CM | POA: Diagnosis not present

## 2020-01-13 DIAGNOSIS — Z8261 Family history of arthritis: Secondary | ICD-10-CM | POA: Diagnosis not present

## 2020-01-13 LAB — CBC
HCT: 25.8 % — ABNORMAL LOW (ref 39.0–52.0)
Hemoglobin: 8.6 g/dL — ABNORMAL LOW (ref 13.0–17.0)
MCH: 30.2 pg (ref 26.0–34.0)
MCHC: 33.3 g/dL (ref 30.0–36.0)
MCV: 90.5 fL (ref 80.0–100.0)
Platelets: 196 10*3/uL (ref 150–400)
RBC: 2.85 MIL/uL — ABNORMAL LOW (ref 4.22–5.81)
RDW: 19.1 % — ABNORMAL HIGH (ref 11.5–15.5)
WBC: 4.4 10*3/uL (ref 4.0–10.5)
nRBC: 0 % (ref 0.0–0.2)

## 2020-01-13 LAB — TSH: TSH: 7.24 u[IU]/mL — ABNORMAL HIGH (ref 0.350–4.500)

## 2020-01-13 LAB — URINALYSIS, ROUTINE W REFLEX MICROSCOPIC
Bilirubin Urine: NEGATIVE
Glucose, UA: NEGATIVE mg/dL
Ketones, ur: NEGATIVE mg/dL
Nitrite: NEGATIVE
Protein, ur: 100 mg/dL — AB
RBC / HPF: >50 RBC/hpf — ABNORMAL HIGH (ref 0–5)
Specific Gravity, Urine: 1.012 (ref 1.005–1.030)
pH: 6 (ref 5.0–8.0)

## 2020-01-13 LAB — PROTIME-INR
INR: 1.2 (ref 0.8–1.2)
Prothrombin Time: 15.5 seconds — ABNORMAL HIGH (ref 11.4–15.2)

## 2020-01-13 LAB — OSMOLALITY, URINE: Osmolality, Ur: 397 mOsm/kg (ref 300–900)

## 2020-01-13 LAB — GLUCOSE, CAPILLARY
Glucose-Capillary: 55 mg/dL — ABNORMAL LOW (ref 70–99)
Glucose-Capillary: 106 mg/dL — ABNORMAL HIGH (ref 70–99)

## 2020-01-13 LAB — COMPREHENSIVE METABOLIC PANEL
ALT: 19 U/L (ref 0–44)
AST: 21 U/L (ref 15–41)
Albumin: 2.6 g/dL — ABNORMAL LOW (ref 3.5–5.0)
Alkaline Phosphatase: 99 U/L (ref 38–126)
Anion gap: 8 (ref 5–15)
BUN: 41 mg/dL — ABNORMAL HIGH (ref 8–23)
CO2: 22 mmol/L (ref 22–32)
Calcium: 8 mg/dL — ABNORMAL LOW (ref 8.9–10.3)
Chloride: 99 mmol/L (ref 98–111)
Creatinine, Ser: 3.48 mg/dL — ABNORMAL HIGH (ref 0.61–1.24)
GFR calc Af Amer: 19 mL/min — ABNORMAL LOW (ref >60)
GFR calc non Af Amer: 17 mL/min — ABNORMAL LOW (ref >60)
Glucose, Bld: 74 mg/dL (ref 70–99)
Potassium: 4.1 mmol/L (ref 3.5–5.1)
Sodium: 129 mmol/L — ABNORMAL LOW (ref 135–145)
Total Bilirubin: 1.3 mg/dL — ABNORMAL HIGH (ref 0.3–1.2)
Total Protein: 8.8 g/dL — ABNORMAL HIGH (ref 6.5–8.1)

## 2020-01-13 LAB — CORTISOL: Cortisol, Plasma: 14.4 ug/dL

## 2020-01-13 LAB — SODIUM, URINE, RANDOM: Sodium, Ur: 75 mmol/L

## 2020-01-13 LAB — SARS CORONAVIRUS 2 (TAT 6-24 HRS): SARS Coronavirus 2: NEGATIVE

## 2020-01-13 LAB — OSMOLALITY: Osmolality: 295 mOsm/kg (ref 275–295)

## 2020-01-13 LAB — HEMOGLOBIN AND HEMATOCRIT, BLOOD
HCT: 27.4 % — ABNORMAL LOW (ref 39.0–52.0)
Hemoglobin: 8.9 g/dL — ABNORMAL LOW (ref 13.0–17.0)

## 2020-01-13 MED ORDER — DEXTROSE 50 % IV SOLN
INTRAVENOUS | Status: AC
  Filled 2020-01-13: qty 50

## 2020-01-13 NOTE — Progress Notes (Signed)
Patient can eat clarified with oncall Urologist.

## 2020-01-13 NOTE — Progress Notes (Signed)
PROGRESS NOTE    Louis Purves Jr.  MRN:[id number] DOB: 06/27/1946 DOA: 01/12/2020 PCP: Crawford, Elizabeth A, MD   Brief Narrative:  Louis Ford  is a 74 y.o. 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  Pt was apparently taking Bactrim for ? UTI.  Pt denies fever, chills, n/v, diarrhea, brbpr, black stool.    In ED,  T afebrile, P 79 R 18, Bp 98/71  Pox 94% on RA  Wbc 3.5, Hgb 6.6, Plt 231 Glucose 114, Bun 46, Creatinine 3.10 Na 127 K 4.8  Assessment & Plan:   Principal Problem:   Anemia Active Problems:   Coronary artery disease involving native heart   Seizure (HCC)   Chronic systolic CHF (congestive heart failure), NYHA class 2 (HCC)   ARF (acute renal failure) (HCC)   Hyponatremia   Acute blood loss anemia likely due to gross hematuria/moderate right hydronephrosis/hydroureter secondary to clot impaction: Presented with hemoglobin of 6.6.  Received 2 units of PRBC transfusion.  Posttransfusion hemoglobin 8.6.  Will recheck hemoglobin later today and tomorrow morning.  Urology on board.  Recommend supportive care with no intervention at this point in time.  Acute renal failure: Likely secondary to obstruction due to clot also causing hydronephrosis.  Could also very well be continuation from Bactrim and worsening millimeters.  Continue hydration.  Repeat labs in the morning.  Urine output only 500 cc since admission.  Not sure about accuracy.  Hold/avoid nephrotoxic agents.  Chronic hyponatremia: 129 today.  Very close to baseline of 132.  ? Multiple Myeloma (follows with [facility]) Follow-up as outpatient.  Chronic systolic congestive heart failure: Stable.  Euvolemic.  Continue home dose of carvedilol.  Hold Lasix. Cont Carvedilol 3.125mg po bid Cont  Lasix 40mg po qday  Paroxysmal atrial fibrillation: Continue amiodarone.  Not on any anticoagulation prior to admission.  CAD s/p CABG Cont Simvastatin 20mg po qhs Cont Cavedilol as above  H/o stroke/ seizure disorder Cont Keppra 1000mg po bid Cont Dilantin Cont Depakote ER   BPH Cont Flomax 0.4mg po qhs  ?Anxiety Cont Duloxetine  DVT prophylaxis: SCD Code Status: Full code Family Communication:  None present at bedside.  Plan of care discussed with patient in length and he verbalized understanding and agreed with it. Disposition Plan: To be determined  Estimated body mass index is 26.22 kg/m as calculated from the following:   Height as of this encounter: 5' 11" (1.803 m).   Weight as of 12/15/19: 85.3 kg.      Nutritional status:               Consultants:   Urology  Procedures:   None  Antimicrobials:   None   Subjective: Seen and examined.  No new complaint.  Feels better.  Objective: Vitals:   01/13/20 0249 01/13/20 0506 01/13/20 0811 01/13/20 1100  BP: (!) 136/115 (!) 121/96 118/87   Pulse: 76 84 79   Resp: 18 18 18   Temp: 98 F (36.7 C) 97.8 F (36.6 C)    TempSrc: Oral Oral    SpO2: 100% 100% 100%   Height:    5' 11" (1.803 m)    Intake/Output Summary (Last 24 hours) at 01/13/2020 1229 Last data filed at 01/13/2020 0800 Gross per 24 hour  Intake   0 ml  Output 500 ml  Net -500 ml   There were no vitals filed for this visit.  Examination:  General exam: Appears calm and comfortable  Respiratory system: Clear to auscultation. Respiratory effort normal. Cardiovascular system: S1 & S2 heard, RRR. No JVD, murmurs, rubs, gallops or clicks. No pedal edema. Gastrointestinal system: Abdomen is nondistended, soft and nontender. No organomegaly or masses felt. Normal bowel sounds heard. Central nervous system: Alert and oriented. No focal neurological deficits. Extremities: Symmetric 5 x 5 power. Skin: No rashes, lesions or  ulcers Psychiatry: Judgement and insight appear normal. Mood & affect appropriate.    Data Reviewed: I have personally reviewed following labs and imaging studies  CBC: Recent Labs  Lab 01/12/20 1149 01/13/20 0802  WBC 3.5 4.4  NEUTROABS 1.7  --   HGB 6.6* 8.6*  HCT 20.0* 25.8*  MCV 92 90.5  PLT 231 196   Basic Metabolic Panel: Recent Labs  Lab 01/12/20 1149 01/13/20 0802  NA 127* 129*  K 4.8 4.1  CL 98 99  CO2 20 22  GLUCOSE 114* 74  BUN 46* 41*  CREATININE 3.10* 3.48*  CALCIUM 7.3* 8.0*   GFR: CrCl cannot be calculated (Unknown ideal weight.). Liver Function Tests: Recent Labs  Lab 01/13/20 0802  AST 21  ALT 19  ALKPHOS 99  BILITOT 1.3*  PROT 8.8*  ALBUMIN 2.6*   No results for input(s): LIPASE, AMYLASE in the last 168 hours. No results for input(s): AMMONIA in the last 168 hours. Coagulation Profile: Recent Labs  Lab 01/13/20 0802  INR 1.2   Cardiac Enzymes: No results for input(s): CKTOTAL, CKMB, CKMBINDEX, TROPONINI in the last 168 hours. BNP (last 3 results) No results for input(s): PROBNP in the last 8760 hours. HbA1C: No results for input(s): HGBA1C in the last 72 hours. CBG: Recent Labs  Lab 01/13/20 0744 01/13/20 0904  GLUCAP 55* 106*   Lipid Profile: No results for input(s): CHOL, HDL, LDLCALC, TRIG, CHOLHDL, LDLDIRECT in the last 72 hours. Thyroid Function Tests: Recent Labs    01/13/20 0802  TSH 7.240*   Anemia Panel: No results for input(s): VITAMINB12, FOLATE, FERRITIN, TIBC, IRON, RETICCTPCT in the last 72 hours. Sepsis Labs: No results for input(s): PROCALCITON, LATICACIDVEN in the last 168 hours.  Recent Results (from the past 240 hour(s))  SARS CORONAVIRUS 2 (TAT 6-24 HRS) Nasopharyngeal Nasopharyngeal Swab     Status: None   Collection Time: 01/12/20  9:14 PM   Specimen: Nasopharyngeal Swab  Result Value Ref Range Status   SARS Coronavirus 2 NEGATIVE NEGATIVE Final    Comment: (NOTE) SARS-CoV-2 target nucleic  acids are NOT DETECTED. The SARS-CoV-2 RNA is generally detectable in upper and lower respiratory specimens during the acute phase of infection. Negative results do not preclude SARS-CoV-2 infection, do not rule out co-infections with other pathogens, and should not be used as the sole basis for treatment or other patient management decisions. Negative results must be combined with clinical observations, patient history, and epidemiological information. The expected result is Negative. Fact Sheet for Patients: https://www.fda.gov/media/138098/download Fact Sheet for Healthcare Providers: https://www.fda.gov/media/138095/download This test is not yet approved or cleared by the [place] FDA and  has been authorized for detection and/or diagnosis of SARS-CoV-2 by FDA under an Emergency Use Authorization (EUA). This EUA will remain  in effect (meaning this test can be used) for the duration of the COVID-19 declaration under Section 56 4(b)(1) of the Act, 21 U.S.C. section 360bbb-3(b)(1), unless the authorization is terminated   or revoked sooner. Performed at [facility], [address] [place], [place] [address]       Radiology Studies: DG Chest 2 View  Result Date: 01/12/2020 CLINICAL DATA:  Chest pain, shortness of breath and dizziness. EXAM: CHEST - 2 VIEW COMPARISON:  November 13, 2019 FINDINGS: Multiple sternal wires and vascular clips are seen. Radiopaque surgical clips are again seen overlying the perihilar region on the left. Mild, stable scarring and/or atelectasis is seen within the retrocardiac region of the left lung base. There is no evidence of a pleural effusion or pneumothorax. The heart size and mediastinal contours are within normal limits. No acute osseous abnormalities are seen involving the visualized skeletal structures. IMPRESSION: 1. Evidence of prior median sternotomy/CABG. 2. Mild, stable left basilar scarring and/or atelectasis. Electronically Signed    By: Thaddeus  Stockburger M.D.   On: 01/12/2020 20:01   CT Renal Stone Study  Result Date: 01/12/2020 CLINICAL DATA:  Hematuria EXAM: CT ABDOMEN AND PELVIS WITHOUT CONTRAST TECHNIQUE: Multidetector CT imaging of the abdomen and pelvis was performed following the standard protocol without IV contrast. COMPARISON:  CT 04/21/2019 FINDINGS: Lower chest: Lung bases demonstrate no acute consolidation or pleural effusion. Borderline cardiomegaly. Coarse pericardial calcification. Hepatobiliary: No focal hepatic abnormality. Increased intraluminal density within the gallbladder. No biliary dilatation Pancreas: Unremarkable. No pancreatic ductal dilatation or surrounding inflammatory changes. Spleen: Normal in size without focal abnormality. Adrenals/Urinary Tract: Adrenal glands are normal. Bilateral renal cystic disease with some of the cysts containing peripheral calcification and possible septa. New moderate right hydronephrosis. New hydroureter. Hyperdensity within the distended right ureter extending from the UPJ to the distal ureter at approximate S1 level after which the ureter becomes decompressed. No calcified stone. The bladder is unremarkable. Stomach/Bowel: Stomach is within normal limits. Appendix appears normal. No evidence of bowel wall thickening, distention, or inflammatory changes. Vascular/Lymphatic: Moderate severe aortic atherosclerosis without aneurysm. No significant adenopathy Reproductive: Prostate is unremarkable. Other: Negative for free air or free fluid. Musculoskeletal: Interval age indeterminate mild superior endplate fracture at L1. IMPRESSION: 1. Interim development of moderate right hydronephrosis and proximal hydroureter. Hyperdense material expanding the right ureter from the UPJ to the distal ureter at the level of upper sacrum, suspicious for blood clot/hemorrhagic material. No definitive stone disease. Findings could be secondary to intraureteral hemorrhage secondary to trauma,  underlying neoplasm, or anticoagulation. 2. Increased intraluminal density within the gallbladder, possible sludge or small stones 3. Bilateral renal cystic disease 4. Interval age indeterminate mild superior endplate deformity at L1 Electronically Signed   By: Kim  Fujinaga M.D.   On: 01/12/2020 21:14    Scheduled Meds: . sodium chloride   Intravenous Once  . amiodarone  200 mg Oral Daily  . carvedilol  3.125 mg Oral BID WC  . divalproex  500 mg Oral QHS  . DULoxetine  30 mg Oral Daily  . ferrous sulfate  325 mg Oral Q breakfast  . furosemide  40 mg Oral Daily  . levETIRAcetam  1,000 mg Oral BID  . phenytoin  100 mg Oral BID  . simvastatin  20 mg Oral q1800  . tamsulosin  0.4 mg Oral Daily   Continuous Infusions:   LOS: 0 days   Time spent: 32 minutes    , MD [facility]  01/13/2020, 12:29 PM   To contact the attending provider between 7A-7P or the covering provider during after hours 7P-7A, please log into the web site [email or web address].  

## 2020-01-13 NOTE — ED Notes (Signed)
Pt transported on continuous monitors to 5M06 in NAD with all belongings via cart transport with this RN. Blood infusing. Pt remains alert, in NAD. Able to speak in full sentences. Vss.

## 2020-01-13 NOTE — ED Notes (Addendum)
Report given to [place], RN on 74M. All questions answered

## 2020-01-13 NOTE — Progress Notes (Signed)
New Admission Note:  Arrival Method: Via stretcher from ED to 65m06 Mental Orientation: Alert & Oriented x3 Telemetry: CCMD verified Assessment: Completed Skin: Refer to flowsheet IV: Left Wrist Pain: 0/10 Safety Measures: Safety Fall Prevention Plan discussed with patient. Admission: Completed [facility] [place] Orientation: Patient has been orientated to the room, unit and the staff.  Orders have been reviewed and are being implemented. Will continue to monitor the patient. Call light has been placed within reach and bed alarm has been activated.   Vassie Moselle, RN  Phone Number: [phone]

## 2020-01-14 LAB — CBC WITH DIFFERENTIAL/PLATELET
Basophils Absolute: 0.1 10*3/uL (ref 0.0–0.1)
Basophils Relative: 1 %
Eosinophils Absolute: 0.1 10*3/uL (ref 0.0–0.5)
Eosinophils Relative: 3 %
HCT: 24.6 % — ABNORMAL LOW (ref 39.0–52.0)
Hemoglobin: 8.2 g/dL — ABNORMAL LOW (ref 13.0–17.0)
Lymphocytes Relative: 24 %
Lymphs Abs: 1.2 10*3/uL (ref 0.7–4.0)
MCH: 29.9 pg (ref 26.0–34.0)
MCHC: 33.3 g/dL (ref 30.0–36.0)
MCV: 89.8 fL (ref 80.0–100.0)
Monocytes Absolute: 1 10*3/uL (ref 0.1–1.0)
Monocytes Relative: 19 %
Neutro Abs: 2.7 10*3/uL (ref 1.7–7.7)
Neutrophils Relative %: 53 %
Platelets: 221 10*3/uL (ref 150–400)
RBC: 2.74 MIL/uL — ABNORMAL LOW (ref 4.22–5.81)
RDW: 18.9 % — ABNORMAL HIGH (ref 11.5–15.5)
WBC: 5.1 10*3/uL (ref 4.0–10.5)

## 2020-01-14 LAB — BASIC METABOLIC PANEL
Anion gap: 6 (ref 5–15)
BUN: 39 mg/dL — ABNORMAL HIGH (ref 8–23)
CO2: 23 mmol/L (ref 22–32)
Calcium: 7.8 mg/dL — ABNORMAL LOW (ref 8.9–10.3)
Chloride: 102 mmol/L (ref 98–111)
Creatinine, Ser: 3.19 mg/dL — ABNORMAL HIGH (ref 0.61–1.24)
GFR calc Af Amer: 21 mL/min — ABNORMAL LOW (ref >60)
GFR calc non Af Amer: 19 mL/min — ABNORMAL LOW (ref >60)
Glucose, Bld: 116 mg/dL — ABNORMAL HIGH (ref 70–99)
Potassium: 4.3 mmol/L (ref 3.5–5.1)
Sodium: 131 mmol/L — ABNORMAL LOW (ref 135–145)

## 2020-01-14 LAB — BPAM RBC
Blood Product Expiration Date: 202101262359
Blood Product Expiration Date: 202101262359
ISSUE DATE / TIME: 202101192248
ISSUE DATE / TIME: 202101200219
Unit Type and Rh: 5100
Unit Type and Rh: 5100

## 2020-01-14 LAB — TYPE AND SCREEN
ABO/RH(D): O POS
Antibody Screen: NEGATIVE
Unit division: 0
Unit division: 0

## 2020-01-14 LAB — URINE CULTURE

## 2020-01-14 NOTE — Treatment Plan (Signed)
Urology treatment plan note:  Evaluated the patient last night.  Urine with clearing and was without clot, mostly yellow with red tinge.  Voiding well.  Did not have any flank pain.  Creatinine slightly lower this morning.  Hemoglobin stable.   Plan to continue conservative management.  Can consider reimaging with renal ultrasound if creatinine does not continue to improve.  Otherwise plan for outpatient cystoscopy and CT urogram once creatinine normalizes.

## 2020-01-14 NOTE — Plan of Care (Signed)
  Problem: Education: Goal: Knowledge of General Education information will improve Description: Including pain rating scale, medication(s)/side effects and non-pharmacologic comfort measures Outcome: Progressing   Problem: Clinical Measurements: Goal: Ability to maintain clinical measurements within normal limits will improve Outcome: Progressing   Problem: Clinical Measurements: Goal: Diagnostic test results will improve Outcome: Progressing   

## 2020-01-14 NOTE — Progress Notes (Signed)
Urology Progress Note   74 year old male with anemia, hematuria, right ureteral obstruction from clot due to bleeding right renal cyst.    Interval: Hemoglobin stable this morning.  Creatinine 3.1 from 3.4.  Continues to have NO flank pain.  Urine has cleared and is now essentially yellow this evening  Objective: Vital signs in last 24 hours: Temp:  [97.8 F (36.6 C)-99 F (37.2 C)] 97.8 F (36.6 C) (01/21 1654) Pulse Rate:  [72-77] 72 (01/21 1654) Resp:  [17-18] 18 (01/21 1654) BP: (92-100)/(65-72) 92/69 (01/21 1654) SpO2:  [100 %] 100 % (01/21 1654) Weight:  [85.3 kg] 85.3 kg (01/20 2056)  Intake/Output from previous day: 01/20 0701 - 01/21 0700 In: 200 [P.O.:200] Out: 1450 [Urine:1450] Intake/Output this shift: No intake/output data recorded.  Physical Exam:  General: Alert and oriented CV: RRR Lungs: Normal work of breathing Abdomen: Soft, appropriately tender.  GU: Voiding volitionally, yellow urine without clot Ext: NT, No erythema  Lab Results: Recent Labs    01/13/20 0802 01/13/20 1801 01/14/20 0257  HGB 8.6* 8.9* 8.2*  HCT 25.8* 27.4* 24.6*   BMET Recent Labs    01/13/20 0802 01/14/20 0257  NA 129* 131*  K 4.1 4.3  CL 99 102  CO2 22 23  GLUCOSE 74 116*  BUN 41* 39*  CREATININE 3.48* 3.19*  CALCIUM 8.0* 7.8*     Studies/Results:   Assessment/Plan:  74 y.o. male with hematuria due to right renal cyst hemorrhage.  Overall improving from hematuria and renal standpoint  -Continue to trend creatinine.  If creatinine increases would likely obtain a renal ultrasound -Okay for regular diet tonight as intervention would not be until late Friday or Saturday if needed -Urology will follow up on labs from tomorrow morning    LOS: 1 day   Tharon Aquas 01/14/2020, 7:48 PM

## 2020-01-14 NOTE — Progress Notes (Signed)
PROGRESS NOTE    [facility].  MMH:[id number] DOB: [birth year] DOA: 01/12/2020 PCP: [name], MD   Brief Narrative:  Louis Ford  is a 74 y.o. 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  Pt was apparently taking Bactrim for ? UTI.  Pt denies fever, chills, n/v, diarrhea, brbpr, black stool.    In ED,  T afebrile, P 79 R 18, Bp 98/71  Pox 94% on RA  Wbc 3.5, Hgb 6.6, Plt 231 Glucose 114, Bun 46, Creatinine 3.10 Na 127 K 4.8  Assessment & Plan:   Principal Problem:   Anemia Active Problems:   Coronary artery disease involving native heart   Seizure (HCC)   Chronic systolic CHF (congestive heart failure), NYHA class 2 (HCC)   ARF (acute renal failure) (HCC)   Hyponatremia   Acute blood loss anemia   Acute blood loss anemia likely due to gross hematuria/moderate right hydronephrosis/hydroureter secondary to clot impaction: Presented with hemoglobin of 6.6.  Received 2 units of PRBC transfusion.  Posttransfusion hemoglobin 8.6 which has remained stable since then.  Urology on board.  Recommend supportive care with no intervention at this point in time.  His urine is clearing up with some clots in between.  Acute renal failure: Likely secondary to obstruction due to clot also causing hydronephrosis.  Could also very well be continuation from Bactrim and worsening millimeters.  Creatinine improving slowly.  Continue hydration.  Repeat labs in the morning.  To avoid nephrotoxic agents.  Chronic hyponatremia: At baseline.  ? Multiple Myeloma (follows with Irene Limbo) Follow-up as outpatient.  Chronic systolic congestive heart failure: Stable.  Euvolemic.  Continue home dose of carvedilol.  Hold Lasix. Cont Carvedilol 3.177m po bid Cont Lasix 448mpo  qday  Paroxysmal atrial fibrillation: Continue amiodarone.  Not on any anticoagulation prior to admission.  CAD s/p CABG Cont Simvastatin 2044mo qhs Cont Cavedilol as above  H/o stroke/ seizure disorder Cont Keppra 1000m62m bid Cont Dilantin Cont Depakote ER   BPH Cont Flomax 0.4mg 97mqhs  ?Anxiety Cont Duloxetine  DVT prophylaxis: SCD Code Status: Full code Family Communication:  None present at bedside.  Plan of care discussed with patient in length and he verbalized understanding and agreed with it. Disposition Plan: Likely home in next 1 to 2 days pending clinical improvement and urology signing off.  Estimated body mass index is 26.23 kg/m as calculated from the following:   Height as of this encounter: _0  (1.803 m).   Weight as of this encounter: 85.3 kg.      Nutritional status:               Consultants:   Urology  Procedures:   None  Antimicrobials:   None   Subjective: Seen and examined.  Feels better.  No complaints.  Objective: Vitals:   01/13/20 1100 01/13/20 2056 01/14/20 0502 01/14/20 0854  BP:  99/72 100/69 95/65  Pulse:  74 74 77  Resp:  _1 Temp:  99 F (37.2 C) 98.2 F (36.8 C) 98.1 F (36.7 C)  TempSrc:  Oral  Oral  SpO2:  100% 100% 100%  Weight:  85.3 kg    Height: _2  (1.803 m)       Intake/Output  Summary (Last 24 hours) at 01/14/2020 1436 Last data filed at 01/14/2020 1100 Gross per 24 hour  Intake 800 ml  Output 950 ml  Net -150 ml   Filed Weights   01/13/20 2056  Weight: 85.3 kg    Examination:  General exam: Appears calm and comfortable  Respiratory system: Clear to auscultation. Respiratory effort normal. Cardiovascular system: S1 & S2 heard, RRR. No JVD, murmurs, rubs, gallops or clicks. No pedal edema. Gastrointestinal system: Abdomen is nondistended, soft and nontender. No organomegaly or masses felt. Normal bowel sounds heard. Central nervous system: Alert and oriented. No  focal neurological deficits. Extremities: Symmetric 5 x 5 power. Skin: No rashes, lesions or ulcers.  Psychiatry: Judgement and insight appear normal. Mood & affect appropriate.     Data Reviewed: I have personally reviewed following labs and imaging studies  CBC: Recent Labs  Lab 01/12/20 1149 01/13/20 0802 01/13/20 1801 01/14/20 0257  WBC 3.5 4.4  --  5.1  NEUTROABS 1.7  --   --  2.7  HGB 6.6* 8.6* 8.9* 8.2*  HCT 20.0* 25.8* 27.4* 24.6*  MCV 92 90.5  --  89.8  PLT 231 196  --  372   Basic Metabolic Panel: Recent Labs  Lab 01/12/20 1149 01/13/20 0802 01/14/20 0257  NA 127* 129* 131*  K 4.8 4.1 4.3  CL 98 99 102  CO2 _0 GLUCOSE 114* 74 116*  BUN 46* 41* 39*  CREATININE 3.10* 3.48* 3.19*  CALCIUM 7.3* 8.0* 7.8*   GFR: Estimated Creatinine Clearance: 22.6 mL/min (A) (by C-G formula based on SCr of 3.19 mg/dL (H)). Liver Function Tests: Recent Labs  Lab 01/13/20 0802  AST 21  ALT 19  ALKPHOS 99  BILITOT 1.3*  PROT 8.8*  ALBUMIN 2.6*   No results for input(s): LIPASE, AMYLASE in the last 168 hours. No results for input(s): AMMONIA in the last 168 hours. Coagulation Profile: Recent Labs  Lab 01/13/20 0802  INR 1.2   Cardiac Enzymes: No results for input(s): CKTOTAL, CKMB, CKMBINDEX, TROPONINI in the last 168 hours. BNP (last 3 results) No results for input(s): PROBNP in the last 8760 hours. HbA1C: No results for input(s): HGBA1C in the last 72 hours. CBG: Recent Labs  Lab 01/13/20 0744 01/13/20 0904  GLUCAP 55* 106*   Lipid Profile: No results for input(s): CHOL, HDL, LDLCALC, TRIG, CHOLHDL, LDLDIRECT in the last 72 hours. Thyroid Function Tests: Recent Labs    01/13/20 0802  TSH 7.240*   Anemia Panel: No results for input(s): VITAMINB12, FOLATE, FERRITIN, TIBC, IRON, RETICCTPCT in the last 72 hours. Sepsis Labs: No results for input(s): PROCALCITON, LATICACIDVEN in the last 168 hours.  Recent Results (from the past 240 hour(s))    SARS CORONAVIRUS 2 (TAT 6-24 HRS) Nasopharyngeal Nasopharyngeal Swab     Status: None   Collection Time: 01/12/20  9:14 PM   Specimen: Nasopharyngeal Swab  Result Value Ref Range Status   SARS Coronavirus 2 NEGATIVE NEGATIVE Final    Comment: (NOTE) SARS-CoV-2 target nucleic acids are NOT DETECTED. The SARS-CoV-2 RNA is generally detectable in upper and lower respiratory specimens during the acute phase of infection. Negative results do not preclude SARS-CoV-2 infection, do not rule out co-infections with other pathogens, and should not be used as the sole basis for treatment or other patient management decisions. Negative results must be combined with clinical observations, patient history, and epidemiological information. The expected result is Negative. Fact Sheet for Patients: SugarRoll.be Fact Sheet for Healthcare Providers:  https://www.woods-mathews.com/ This test is not yet approved or cleared by the [place] and  has been authorized for detection and/or diagnosis of SARS-CoV-2 by FDA under an Emergency Use Authorization (EUA). This EUA will remain  in effect (meaning this test can be used) for the duration of the COVID-19 declaration under Section 56 4(b)(1) of the Act, 21 U.S.C. section 360bbb-3(b)(1), unless the authorization is terminated or revoked sooner. Performed at [facility], [place] [address] [place], [place] [address]   Urine culture     Status: Abnormal   Collection Time: 01/13/20 12:49 AM   Specimen: Urine, Clean Catch  Result Value Ref Range Status   Specimen Description URINE, CLEAN CATCH  Final   Special Requests   Final    NONE Performed at [facility], [place] [address], [place], [place] [address]    Culture MULTIPLE SPECIES PRESENT, SUGGEST RECOLLECTION (A)  Final   Report Status 01/14/2020 FINAL  Final      Radiology Studies: DG Chest 2 View  Result Date: 01/12/2020 CLINICAL DATA:   Chest pain, shortness of breath and dizziness. EXAM: CHEST - 2 VIEW COMPARISON:  November 13, 2019 FINDINGS: Multiple sternal wires and vascular clips are seen. Radiopaque surgical clips are again seen overlying the perihilar region on the left. Mild, stable scarring and/or atelectasis is seen within the retrocardiac region of the left lung base. There is no evidence of a pleural effusion or pneumothorax. The heart size and mediastinal contours are within normal limits. No acute osseous abnormalities are seen involving the visualized skeletal structures. IMPRESSION: 1. Evidence of prior median sternotomy/CABG. 2. Mild, stable left basilar scarring and/or atelectasis. Electronically Signed   By: Virgina Norfolk M.D.   On: 01/12/2020 20:01   CT Renal Stone Study  Result Date: 01/12/2020 CLINICAL DATA:  Hematuria EXAM: CT ABDOMEN AND PELVIS WITHOUT CONTRAST TECHNIQUE: Multidetector CT imaging of the abdomen and pelvis was performed following the standard protocol without IV contrast. COMPARISON:  CT 04/21/2019 FINDINGS: Lower chest: Lung bases demonstrate no acute consolidation or pleural effusion. Borderline cardiomegaly. Coarse pericardial calcification. Hepatobiliary: No focal hepatic abnormality. Increased intraluminal density within the gallbladder. No biliary dilatation Pancreas: Unremarkable. No pancreatic ductal dilatation or surrounding inflammatory changes. Spleen: Normal in size without focal abnormality. Adrenals/Urinary Tract: Adrenal glands are normal. Bilateral renal cystic disease with some of the cysts containing peripheral calcification and possible septa. New moderate right hydronephrosis. New hydroureter. Hyperdensity within the distended right ureter extending from the UPJ to the distal ureter at approximate S1 level after which the ureter becomes decompressed. No calcified stone. The bladder is unremarkable. Stomach/Bowel: Stomach is within normal limits. Appendix appears normal. No evidence  of bowel wall thickening, distention, or inflammatory changes. Vascular/Lymphatic: Moderate severe aortic atherosclerosis without aneurysm. No significant adenopathy Reproductive: Prostate is unremarkable. Other: Negative for free air or free fluid. Musculoskeletal: Interval age indeterminate mild superior endplate fracture at L1. IMPRESSION: 1. Interim development of moderate right hydronephrosis and proximal hydroureter. Hyperdense material expanding the right ureter from the UPJ to the distal ureter at the level of upper sacrum, suspicious for blood clot/hemorrhagic material. No definitive stone disease. Findings could be secondary to intraureteral hemorrhage secondary to trauma, underlying neoplasm, or anticoagulation. 2. Increased intraluminal density within the gallbladder, possible sludge or small stones 3. Bilateral renal cystic disease 4. Interval age indeterminate mild superior endplate deformity at L1 Electronically Signed   By: Donavan Foil M.D.   On: 01/12/2020 21:14    Scheduled  Meds: . sodium chloride   Intravenous Once  . amiodarone  200 mg Oral Daily  . carvedilol  3.125 mg Oral BID WC  . divalproex  500 mg Oral QHS  . DULoxetine  30 mg Oral Daily  . ferrous sulfate  325 mg Oral Q breakfast  . levETIRAcetam  1,000 mg Oral BID  . phenytoin  100 mg Oral BID  . simvastatin  20 mg Oral q1800  . tamsulosin  0.4 mg Oral Daily   Continuous Infusions:   LOS: 1 day   Time spent: 28 minutes   Darliss Cheney, MD [facility]  01/14/2020, 2:36 PM   To contact the attending provider between 7A-7P or the covering provider during after hours 7P-7A, please log into the web site [email or web address].

## 2020-01-15 LAB — CBC WITH DIFFERENTIAL/PLATELET
Abs Immature Granulocytes: 0.02 10*3/uL (ref 0.00–0.07)
Basophils Absolute: 0 10*3/uL (ref 0.0–0.1)
Basophils Relative: 0 %
Eosinophils Absolute: 0.2 10*3/uL (ref 0.0–0.5)
Eosinophils Relative: 4 %
HCT: 27.1 % — ABNORMAL LOW (ref 39.0–52.0)
Hemoglobin: 8.6 g/dL — ABNORMAL LOW (ref 13.0–17.0)
Immature Granulocytes: 0 %
Lymphocytes Relative: 29 %
Lymphs Abs: 1.4 10*3/uL (ref 0.7–4.0)
MCH: 29.5 pg (ref 26.0–34.0)
MCHC: 31.7 g/dL (ref 30.0–36.0)
MCV: 92.8 fL (ref 80.0–100.0)
Monocytes Absolute: 0.8 10*3/uL (ref 0.1–1.0)
Monocytes Relative: 18 %
Neutro Abs: 2.3 10*3/uL (ref 1.7–7.7)
Neutrophils Relative %: 49 %
Platelets: 242 10*3/uL (ref 150–400)
RBC: 2.92 MIL/uL — ABNORMAL LOW (ref 4.22–5.81)
RDW: 18.6 % — ABNORMAL HIGH (ref 11.5–15.5)
WBC: 4.7 10*3/uL (ref 4.0–10.5)
nRBC: 0.4 % — ABNORMAL HIGH (ref 0.0–0.2)

## 2020-01-15 LAB — BASIC METABOLIC PANEL
Anion gap: 4 — ABNORMAL LOW (ref 5–15)
BUN: 33 mg/dL — ABNORMAL HIGH (ref 8–23)
CO2: 24 mmol/L (ref 22–32)
Calcium: 8.1 mg/dL — ABNORMAL LOW (ref 8.9–10.3)
Chloride: 102 mmol/L (ref 98–111)
Creatinine, Ser: 2.44 mg/dL — ABNORMAL HIGH (ref 0.61–1.24)
GFR calc Af Amer: 30 mL/min — ABNORMAL LOW (ref >60)
GFR calc non Af Amer: 26 mL/min — ABNORMAL LOW (ref >60)
Glucose, Bld: 74 mg/dL (ref 70–99)
Potassium: 4.6 mmol/L (ref 3.5–5.1)
Sodium: 130 mmol/L — ABNORMAL LOW (ref 135–145)

## 2020-01-15 MED ORDER — SODIUM CHLORIDE 0.9 % IV SOLN: INTRAVENOUS | Status: DC

## 2020-01-15 NOTE — Progress Notes (Signed)
PROGRESS NOTE    [facility].  NHA:[id number] DOB: 1946/11/04 DOA: 01/12/2020 PCP: [name], MD   Brief Narrative:  Louis Ford  is a 74 y.o. 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  Pt was apparently taking Bactrim for ? UTI.  Pt denies fever, chills, n/v, diarrhea, brbpr, black stool.    In ED,  T afebrile, P 79 R 18, Bp 98/71  Pox 94% on RA  Wbc 3.5, Hgb 6.6, Plt 231 Glucose 114, Bun 46, Creatinine 3.10 Na 127 K 4.8  Assessment & Plan:   Principal Problem:   Anemia Active Problems:   Coronary artery disease involving native heart   Seizure (HCC)   Chronic systolic CHF (congestive heart failure), NYHA class 2 (HCC)   ARF (acute renal failure) (HCC)   Hyponatremia   Acute blood loss anemia   Acute blood loss anemia likely due to gross hematuria/moderate right hydronephrosis/hydroureter secondary to clot impaction: Presented with hemoglobin of 6.6.  Received 2 units of PRBC transfusion.  Posttransfusion hemoglobin 8.6 which has remained stable since then.  Urology on board.  Recommend supportive care with no intervention at this point in time.  Urine continues to clear up with no further blood clots.  Appreciate urology help.  Will await further recommendations.  Acute renal failure: Likely secondary to obstruction due to clot also causing hydronephrosis.  Could also very well be continuation from Bactrim and worsening millimeters.  Creatinine improving slowly.  Continue hydration.  Repeat labs in the morning.  Continue to avoid nephrotoxic agents.  Chronic hyponatremia: At baseline.  ? Multiple Myeloma (follows with Irene Limbo) Follow-up as outpatient.  Chronic systolic congestive heart failure: Stable.  Euvolemic.  Continue home dose of  carvedilol.  Continue to hold Lasix.  Paroxysmal atrial fibrillation: Continue amiodarone.  Not on any anticoagulation prior to admission.  CAD s/p CABG Cont Simvastatin 70m po qhs Cont Cavedilol as above  H/o stroke/ seizure disorder Cont Keppra 10022mpo bid Cont Dilantin Cont Depakote ER   BPH Cont Flomax 0.64m37mo qhs  ?Anxiety Cont Duloxetine  DVT prophylaxis: SCD Code Status: Full code Family Communication:  None present at bedside.  Plan of care discussed with patient in length and he verbalized understanding and agreed with it. Disposition Plan: Due to hospitalization, PT was ordered but patient has declined to be evaluated by PT.  Will likely be discharged home in next 1 to 2 days and once cleared by urology.  Estimated body mass index is 26.23 kg/m as calculated from the following:   Height as of this encounter: _0  (1.803 m).   Weight as of this encounter: 85.3 kg.      Nutritional status:               Consultants:   Urology  Procedures:   None  Antimicrobials:   None   Subjective: Seen and examined.  He has no complaints.  Objective: Vitals:   01/14/20 1654 01/14/20 2038 01/15/20 0448 01/15/20 0840  BP: 92/69 98/65 101/75 94/71  Pulse: 72 77 73 70  Resp: _1 Temp: 97.8 F (36.6 C) 98.3 F (36.8 C) 98.7 F (37.1 C) (!) 97.5 F (36.4 C)  TempSrc: Oral Oral  Oral  SpO2: 100% 100%  100% 100%  Weight:      Height:        Intake/Output Summary (Last 24 hours) at 01/15/2020 1148 Last data filed at 01/15/2020 1101 Gross per 24 hour  Intake 500 ml  Output 500 ml  Net 0 ml   Filed Weights   01/13/20 2056  Weight: 85.3 kg    Examination: General exam: Appears calm and comfortable  Respiratory system: Clear to auscultation. Respiratory effort normal. Cardiovascular system: S1 & S2 heard, RRR. No JVD, murmurs, rubs, gallops or clicks. No pedal edema. Gastrointestinal system: Abdomen is nondistended, soft and  nontender. No organomegaly or masses felt. Normal bowel sounds heard. Central nervous system: Alert and oriented. No focal neurological deficits. Extremities: Symmetric 5 x 5 power. Skin: No rashes, lesions or ulcers.  Psychiatry: Judgement and insight appear normal. Mood & affect appropriate.   Data Reviewed: I have personally reviewed following labs and imaging studies  CBC: Recent Labs  Lab 01/12/20 1149 01/13/20 0802 01/13/20 1801 01/14/20 0257 01/15/20 0404  WBC 3.5 4.4  --  5.1 4.7  NEUTROABS 1.7  --   --  2.7 2.3  HGB 6.6* 8.6* 8.9* 8.2* 8.6*  HCT 20.0* 25.8* 27.4* 24.6* 27.1*  MCV 92 90.5  --  89.8 92.8  PLT 231 196  --  221 831   Basic Metabolic Panel: Recent Labs  Lab 01/12/20 1149 01/13/20 0802 01/14/20 0257 01/15/20 0404  NA 127* 129* 131* 130*  K 4.8 4.1 4.3 4.6  CL 98 99 102 102  CO2 _0 GLUCOSE 114* 74 116* 74  BUN 46* 41* 39* 33*  CREATININE 3.10* 3.48* 3.19* 2.44*  CALCIUM 7.3* 8.0* 7.8* 8.1*   GFR: Estimated Creatinine Clearance: 29.6 mL/min (A) (by C-G formula based on SCr of 2.44 mg/dL (H)). Liver Function Tests: Recent Labs  Lab 01/13/20 0802  AST 21  ALT 19  ALKPHOS 99  BILITOT 1.3*  PROT 8.8*  ALBUMIN 2.6*   No results for input(s): LIPASE, AMYLASE in the last 168 hours. No results for input(s): AMMONIA in the last 168 hours. Coagulation Profile: Recent Labs  Lab 01/13/20 0802  INR 1.2   Cardiac Enzymes: No results for input(s): CKTOTAL, CKMB, CKMBINDEX, TROPONINI in the last 168 hours. BNP (last 3 results) No results for input(s): PROBNP in the last 8760 hours. HbA1C: No results for input(s): HGBA1C in the last 72 hours. CBG: Recent Labs  Lab 01/13/20 0744 01/13/20 0904  GLUCAP 55* 106*   Lipid Profile: No results for input(s): CHOL, HDL, LDLCALC, TRIG, CHOLHDL, LDLDIRECT in the last 72 hours. Thyroid Function Tests: Recent Labs    01/13/20 0802  TSH 7.240*   Anemia Panel: No results for input(s):  VITAMINB12, FOLATE, FERRITIN, TIBC, IRON, RETICCTPCT in the last 72 hours. Sepsis Labs: No results for input(s): PROCALCITON, LATICACIDVEN in the last 168 hours.  Recent Results (from the past 240 hour(s))  SARS CORONAVIRUS 2 (TAT 6-24 HRS) Nasopharyngeal Nasopharyngeal Swab     Status: None   Collection Time: 01/12/20  9:14 PM   Specimen: Nasopharyngeal Swab  Result Value Ref Range Status   SARS Coronavirus 2 NEGATIVE NEGATIVE Final    Comment: (NOTE) SARS-CoV-2 target nucleic acids are NOT DETECTED. The SARS-CoV-2 RNA is generally detectable in upper and lower respiratory specimens during the acute phase of infection. Negative results do not preclude SARS-CoV-2 infection, do not rule out co-infections with other pathogens, and should not be used as the sole basis for treatment or  other patient management decisions. Negative results must be combined with clinical observations, patient history, and epidemiological information. The expected result is Negative. Fact Sheet for Patients: SugarRoll.be Fact Sheet for Healthcare Providers: https://www.woods-mathews.com/ This test is not yet approved or cleared by the [place] FDA and  has been authorized for detection and/or diagnosis of SARS-CoV-2 by FDA under an Emergency Use Authorization (EUA). This EUA will remain  in effect (meaning this test can be used) for the duration of the COVID-19 declaration under Section 56 4(b)(1) of the Act, 21 U.S.C. section 360bbb-3(b)(1), unless the authorization is terminated or revoked sooner. Performed at [facility], [place] [address] [place], [place] [address]   Urine culture     Status: Abnormal   Collection Time: 01/13/20 12:49 AM   Specimen: Urine, Clean Catch  Result Value Ref Range Status   Specimen Description URINE, CLEAN CATCH  Final   Special Requests   Final    NONE Performed at [facility], [place] [address] [place],  [place] [address]    Culture MULTIPLE SPECIES PRESENT, SUGGEST RECOLLECTION (A)  Final   Report Status 01/14/2020 FINAL  Final      Radiology Studies: No results found.  Scheduled Meds: . sodium chloride   Intravenous Once  . amiodarone  200 mg Oral Daily  . carvedilol  3.125 mg Oral BID WC  . divalproex  500 mg Oral QHS  . DULoxetine  30 mg Oral Daily  . ferrous sulfate  325 mg Oral Q breakfast  . levETIRAcetam  1,000 mg Oral BID  . phenytoin  100 mg Oral BID  . simvastatin  20 mg Oral q1800  . tamsulosin  0.4 mg Oral Daily   Continuous Infusions:   LOS: 2 days   Time spent: 27 minutes   Darliss Cheney, MD [facility]  01/15/2020, 11:48 AM   To contact the attending provider between 7A-7P or the covering provider during after hours 7P-7A, please log into the web site [email or web address].

## 2020-01-15 NOTE — Plan of Care (Signed)
  Problem: Education: Goal: Knowledge of General Education information will improve Description: Including pain rating scale, medication(s)/side effects and non-pharmacologic comfort measures Outcome: Progressing   Problem: Coping: Goal: Level of anxiety will decrease Outcome: Progressing   Problem: Elimination: Goal: Will not experience complications related to bowel motility Outcome: Progressing   

## 2020-01-15 NOTE — Progress Notes (Addendum)
Urology Progress Note   74 year old male with anemia, hematuria, right ureteral obstruction from clot due to bleeding right renal cyst.    Interval: Hemoglobin stable this morning.  Cr. Continue to improve. No pain. Urine clear this PM.   Objective: Vital signs in last 24 hours: Temp:  [97.5 F (36.4 C)-98.7 F (37.1 C)] 97.7 F (36.5 C) (01/22 1713) Pulse Rate:  [70-84] 84 (01/22 1713) Resp:  [16-18] 16 (01/22 1713) BP: (94-103)/(65-75) 103/74 (01/22 1713) SpO2:  [100 %] 100 % (01/22 1713)  Intake/Output from previous day: 01/21 0701 - 01/22 0700 In: 900 [P.O.:900] Out: 300 [Urine:300] Intake/Output this shift: Total I/O In: 495 [P.O.:400; I.V.:95] Out: 200 [Urine:200]  Physical Exam:  General: Alert and oriented CV: RRR Lungs: Normal work of breathing Abdomen: Soft, appropriately tender.  GU: Voiding volitionally, yellow urine without clot Ext: NT, No erythema  Lab Results: Recent Labs    01/13/20 1801 01/14/20 0257 01/15/20 0404  HGB 8.9* 8.2* 8.6*  HCT 27.4* 24.6* 27.1*   BMET Recent Labs    01/14/20 0257 01/15/20 0404  NA 131* 130*  K 4.3 4.6  CL 102 102  CO2 23 24  GLUCOSE 116* 74  BUN 39* 33*  CREATININE 3.19* 2.44*  CALCIUM 7.8* 8.1*     Studies/Results:   Assessment/Plan:  74 y.o. male with hematuria due to right renal cyst hemorrhage.  Overall improving from hematuria and renal standpoint  -Continue to trend creatinine.  If creatinine increases would likely obtain a renal ultrasound - No intervention planned - Urology outpatient follow up for cysto + CTU (pending Cr) on 01/18/20 - Will chart check over the weekend unless something changes    LOS: 2 days   Tharon Aquas 01/15/2020, 6:05 PM

## 2020-01-15 NOTE — Progress Notes (Signed)
PT Cancellation Note  Patient Details Name: Louis Ford. MRN: [id number] DOB: Nov 30, 1946   Cancelled Treatment:    Reason Eval/Treat Not Completed: Patient declined, no reason specified. Despite multiple attempts to encourage patient to participate this morning he declines. States he will do it tomorrow.    Chaselyn Nanney 01/15/2020, 9:44 AM

## 2020-01-15 NOTE — Plan of Care (Signed)
  Problem: Activity: Goal: Risk for activity intolerance will decrease Outcome: Progressing   

## 2020-01-16 DIAGNOSIS — I2510 Atherosclerotic heart disease of native coronary artery without angina pectoris: Secondary | ICD-10-CM

## 2020-01-16 DIAGNOSIS — N179 Acute kidney failure, unspecified: Secondary | ICD-10-CM

## 2020-01-16 DIAGNOSIS — D62 Acute posthemorrhagic anemia: Secondary | ICD-10-CM

## 2020-01-16 DIAGNOSIS — E871 Hypo-osmolality and hyponatremia: Secondary | ICD-10-CM

## 2020-01-16 DIAGNOSIS — R079 Chest pain, unspecified: Secondary | ICD-10-CM

## 2020-01-16 DIAGNOSIS — R569 Unspecified convulsions: Secondary | ICD-10-CM

## 2020-01-16 LAB — CBC WITH DIFFERENTIAL/PLATELET
Abs Immature Granulocytes: 0.02 10*3/uL (ref 0.00–0.07)
Basophils Absolute: 0 10*3/uL (ref 0.0–0.1)
Basophils Relative: 0 %
Eosinophils Absolute: 0.2 10*3/uL (ref 0.0–0.5)
Eosinophils Relative: 4 %
HCT: 27.2 % — ABNORMAL LOW (ref 39.0–52.0)
Hemoglobin: 8.5 g/dL — ABNORMAL LOW (ref 13.0–17.0)
Immature Granulocytes: 0 %
Lymphocytes Relative: 23 %
Lymphs Abs: 1.2 10*3/uL (ref 0.7–4.0)
MCH: 29.8 pg (ref 26.0–34.0)
MCHC: 31.3 g/dL (ref 30.0–36.0)
MCV: 95.4 fL (ref 80.0–100.0)
Monocytes Absolute: 0.9 10*3/uL (ref 0.1–1.0)
Monocytes Relative: 17 %
Neutro Abs: 2.7 10*3/uL (ref 1.7–7.7)
Neutrophils Relative %: 56 %
Platelets: 203 10*3/uL (ref 150–400)
RBC: 2.85 MIL/uL — ABNORMAL LOW (ref 4.22–5.81)
RDW: 18.3 % — ABNORMAL HIGH (ref 11.5–15.5)
WBC: 4.9 10*3/uL (ref 4.0–10.5)
nRBC: 0 % (ref 0.0–0.2)

## 2020-01-16 LAB — BASIC METABOLIC PANEL
Anion gap: 4 — ABNORMAL LOW (ref 5–15)
BUN: 28 mg/dL — ABNORMAL HIGH (ref 8–23)
CO2: 22 mmol/L (ref 22–32)
Calcium: 7.8 mg/dL — ABNORMAL LOW (ref 8.9–10.3)
Chloride: 105 mmol/L (ref 98–111)
Creatinine, Ser: 1.85 mg/dL — ABNORMAL HIGH (ref 0.61–1.24)
GFR calc Af Amer: 42 mL/min — ABNORMAL LOW (ref >60)
GFR calc non Af Amer: 36 mL/min — ABNORMAL LOW (ref >60)
Glucose, Bld: 88 mg/dL (ref 70–99)
Potassium: 5.6 mmol/L — ABNORMAL HIGH (ref 3.5–5.1)
Sodium: 131 mmol/L — ABNORMAL LOW (ref 135–145)

## 2020-01-16 LAB — TROPONIN I (HIGH SENSITIVITY)
Troponin I (High Sensitivity): 6 ng/L (ref <18)
Troponin I (High Sensitivity): 8 ng/L (ref <18)

## 2020-01-16 MED ORDER — SODIUM POLYSTYRENE SULFONATE 15 GM/60ML PO SUSP
30.0000 g | Freq: Once | ORAL | Status: AC
  Administered 2020-01-16: 30 g via ORAL
  Filled 2020-01-16: qty 120

## 2020-01-16 NOTE — Progress Notes (Signed)
Pt c/o chest pain on the left side of the upper chest. Pt states that he has had this pain in the past and it comes from the removal of the pacemaker. MD ordered troponin's and pt requested Tylenol (See MAR).   No distress noted, VSS.    Paulla Fore, RN, BSN

## 2020-01-16 NOTE — Plan of Care (Signed)
  Problem: Education: Goal: Knowledge of General Education information will improve Description Including pain rating scale, medication(s)/side effects and non-pharmacologic comfort measures Outcome: Progressing   

## 2020-01-16 NOTE — Progress Notes (Signed)
PROGRESS NOTE    [facility].  XTG:[id number] DOB: [birth year] DOA: 01/12/2020 PCP: [name], MD   Brief Narrative:  Louis Ford  is a 74 y.o. 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  Pt was apparently taking Bactrim for ? UTI.  Pt denied fever, chills, n/v, diarrhea, brbpr, black stool.   In ED,   Bp 98/71   Hgb 6.6, Plt 231 Creatinine 3.10, Na 127   Assessment & Plan:   Principal Problem:   Anemia Active Problems:   Coronary artery disease involving native heart   Seizure (HCC)   Chronic systolic CHF (congestive heart failure), NYHA class 2 (HCC)   ARF (acute renal failure) (HCC)   Hyponatremia   Acute blood loss anemia   Acute blood loss anemia likely due to gross hematuria/moderate right hydronephrosis/hydroureter secondary to clot impaction: Presented with hemoglobin of 6.6.  Received 2 units of PRBC transfusion. hematuria due to right renal cyst hemorrhage.  Overall improving from hematuria and renal standpoint per urology Urology following recommend supportive care with no intervention at this point in time.   Urology outpatient follow up for cysto + CTU (pending Cr) on 01/18/20  Acute renal failure: Likely secondary to obstruction due to clot also causing hydronephrosis.Also possibly combo with Bactrim. Creatinine improving slowly.   Continue hydration.   Monitor labs Avoid nephrotoxic agents.  Chronic hyponatremia: with mild improvement.  ? Multiple Myeloma (follows with Irene Limbo) Follow-up as outpatient.  Chronic systolic congestive heart failure: Stable.  Euvolemic.  Continue home dose of carvedilol.  Continue to hold Lasix.  Paroxysmal atrial fibrillation: Continue amiodarone.  Not on any anticoagulation prior to  admission.  CAD s/p CABG- had mild cp today while in bed. Will ck troponin.continue to monitor Cont Simvastatin 56m po qhs Cont Cavedilol as above  H/o stroke/ seizure disorder Cont Keppra 10043mpo bid Cont Dilantin Cont Depakote ER   BPH Cont Flomax 0.81m881mo qhs  ?Anxiety Cont Duloxetine  Mild hyperkalemia- K 5.6. will give kayexalate x1 with repeat labs in am.   DVT prophylaxis: SCD Code Status: Full code Family Communication:  None present at bedside.   Disposition Plan: PT recommended home PT. Will be here 1-2 more days until creatinine improves , and ck am K to make sure improves after tx.      Consultants:   Urology  Procedures:   None  Antimicrobials:   None   Subjective: Patient had some chest pain while sitting.  No other complaints.  No shortness of breath.  Known chest pain with ambulation.  Objective: Vitals:   01/15/20 2110 01/16/20 0300 01/16/20 0452 01/16/20 0926  BP: 99/74 109/72 105/81 105/75  Pulse: 75 70 76 75  Resp: 18  18 18   Temp: 98.1 F (36.7 C)  97.8 F (36.6 C) 98.2 F (36.8 C)  TempSrc: Oral   Oral  SpO2: 100% 100% 100% 100%  Weight: 85.6 kg     Height:        Intake/Output Summary (Last 24 hours) at 01/16/2020 1618 Last data filed at 01/16/2020 1157 Gross per 24 hour  Intake 560 ml  Output 1425 ml  Net -865 ml   Filed Weights   01/13/20 2056 01/15/20 2110  Weight: 85.3 kg 85.6 kg  Examination: General exam: Appears calm and comfortable , nad Respiratory system: Clear to auscultation. Respiratory effort normal. Cardiovascular system: S1 & S2 heard, RRR. No  murmurs, rubs, gallops or clicks.  Gastrointestinal system: Abdomen is nondistended, soft and nontender.  Normal bowel sounds heard. Central nervous system: Alert and oriented. No focal neurological deficits. Extremities: No pedal edema. Skin: warm, dry Psychiatry: Judgement and insight appear normal. Mood & affect appropriate.   Data Reviewed: I have  personally reviewed following labs and imaging studies  CBC: Recent Labs  Lab 01/12/20 1149 01/13/20 0802 01/13/20 1801 01/14/20 0257 01/15/20 0404 01/16/20 0345  WBC 3.5 4.4  --  5.1 4.7 4.9  NEUTROABS 1.7  --   --  2.7 2.3 2.7  HGB 6.6* 8.6* 8.9* 8.2* 8.6* 8.5*  HCT 20.0* 25.8* 27.4* 24.6* 27.1* 27.2*  MCV 92 90.5  --  89.8 92.8 95.4  PLT 231 196  --  221 242 820   Basic Metabolic Panel: Recent Labs  Lab 01/12/20 1149 01/13/20 0802 01/14/20 0257 01/15/20 0404 01/16/20 0345  NA 127* 129* 131* 130* 131*  K 4.8 4.1 4.3 4.6 5.6*  CL 98 99 102 102 105  CO2 20 22 23 24 22   GLUCOSE 114* 74 116* 74 88  BUN 46* 41* 39* 33* 28*  CREATININE 3.10* 3.48* 3.19* 2.44* 1.85*  CALCIUM 7.3* 8.0* 7.8* 8.1* 7.8*   GFR: Estimated Creatinine Clearance: 39 mL/min (A) (by C-G formula based on SCr of 1.85 mg/dL (H)). Liver Function Tests: Recent Labs  Lab 01/13/20 0802  AST 21  ALT 19  ALKPHOS 99  BILITOT 1.3*  PROT 8.8*  ALBUMIN 2.6*   No results for input(s): LIPASE, AMYLASE in the last 168 hours. No results for input(s): AMMONIA in the last 168 hours. Coagulation Profile: Recent Labs  Lab 01/13/20 0802  INR 1.2   Cardiac Enzymes: No results for input(s): CKTOTAL, CKMB, CKMBINDEX, TROPONINI in the last 168 hours. BNP (last 3 results) No results for input(s): PROBNP in the last 8760 hours. HbA1C: No results for input(s): HGBA1C in the last 72 hours. CBG: Recent Labs  Lab 01/13/20 0744 01/13/20 0904  GLUCAP 55* 106*   Lipid Profile: No results for input(s): CHOL, HDL, LDLCALC, TRIG, CHOLHDL, LDLDIRECT in the last 72 hours. Thyroid Function Tests: No results for input(s): TSH, T4TOTAL, FREET4, T3FREE, THYROIDAB in the last 72 hours. Anemia Panel: No results for input(s): VITAMINB12, FOLATE, FERRITIN, TIBC, IRON, RETICCTPCT in the last 72 hours. Sepsis Labs: No results for input(s): PROCALCITON, LATICACIDVEN in the last 168 hours.  Recent Results (from the past  240 hour(s))  SARS CORONAVIRUS 2 (TAT 6-24 HRS) Nasopharyngeal Nasopharyngeal Swab     Status: None   Collection Time: 01/12/20  9:14 PM   Specimen: Nasopharyngeal Swab  Result Value Ref Range Status   SARS Coronavirus 2 NEGATIVE NEGATIVE Final    Comment: (NOTE) SARS-CoV-2 target nucleic acids are NOT DETECTED. The SARS-CoV-2 RNA is generally detectable in upper and lower respiratory specimens during the acute phase of infection. Negative results do not preclude SARS-CoV-2 infection, do not rule out co-infections with other pathogens, and should not be used as the sole basis for treatment or other patient management decisions. Negative results must be combined with clinical observations, patient history, and epidemiological information. The expected result is Negative. Fact Sheet for Patients: SugarRoll.be Fact Sheet for Healthcare Providers: https://www.woods-mathews.com/ This test is not yet approved or cleared by the [place] FDA and  has been authorized for detection  and/or diagnosis of SARS-CoV-2 by FDA under an Emergency Use Authorization (EUA). This EUA will remain  in effect (meaning this test can be used) for the duration of the COVID-19 declaration under Section 56 4(b)(1) of the Act, 21 U.S.C. section 360bbb-3(b)(1), unless the authorization is terminated or revoked sooner. Performed at [facility], [place] [address] [place], [place] [address]   Urine culture     Status: Abnormal   Collection Time: 01/13/20 12:49 AM   Specimen: Urine, Clean Catch  Result Value Ref Range Status   Specimen Description URINE, CLEAN CATCH  Final   Special Requests   Final    NONE Performed at [facility], [place] [address], [place], [place] [address]    Culture MULTIPLE SPECIES PRESENT, SUGGEST RECOLLECTION (A)  Final   Report Status 01/14/2020 FINAL  Final      Radiology Studies: No results found.  Scheduled Meds: . sodium  chloride   Intravenous Once  . amiodarone  200 mg Oral Daily  . carvedilol  3.125 mg Oral BID WC  . divalproex  500 mg Oral QHS  . DULoxetine  30 mg Oral Daily  . ferrous sulfate  325 mg Oral Q breakfast  . levETIRAcetam  1,000 mg Oral BID  . phenytoin  100 mg Oral BID  . simvastatin  20 mg Oral q1800  . tamsulosin  0.4 mg Oral Daily   Continuous Infusions: . sodium chloride 100 mL/hr at 01/16/20 0927     LOS: 3 days   Time spent: 45 minutes with more than 50% COC   Nolberto Hanlon, MD [facility]  01/16/2020, 4:18 PM    Patient ID: Louis Dandy., male   DOB: 20-Sep-1946, 74 y.o.   MRN: [id number]

## 2020-01-16 NOTE — Evaluation (Signed)
Physical Therapy Evaluation Patient Details Name: Louis Ford. MRN: [id number] DOB: 09/10/1946 Today's Date: 01/16/2020   History of Present Illness  Pt is a 74 y.o. male admitted 01/11/2019 with  anemia and AKI ICD . S/p TEE 11/19. S/p ICD system extraction 11/20. PMH includes CHF, ICM, CAD, ICD with h/o complications.; seizure disorder, Stroke, CAD  factor VII deficiency  Clinical Impression  Patient presents with decreased endurance compared to baseline. He walked to the bathroom and back with supervision but upon returning from the bathroom he was short of breath. He has had home health in the past and had improved endurance with therapy. Acute therapy will continue to work with the patient to improve ability to ambulate and endurance with functional mobility.     Follow Up Recommendations Home health PT    Equipment Recommendations  None recommended by PT    Recommendations for Other Services       Precautions / Restrictions Precautions Precaution Comments: fall Restrictions Weight Bearing Restrictions: No      Mobility  Bed Mobility Overal bed mobility: Independent             General bed mobility comments: sat at the edge of the bed without assistance   Transfers Overall transfer level: Needs assistance Equipment used: Rolling walker (2 wheeled) Transfers: Sit to/from Stand Sit to Stand: Supervision         General transfer comment: supervision for intial balance   Ambulation/Gait Ambulation/Gait assistance: Supervision Gait Distance (Feet): 16 Feet(8'x2 declined further distance 2nd to knee pain ) Assistive device: Rolling walker (2 wheeled) Gait Pattern/deviations: Trunk flexed Gait velocity: decreased  Gait velocity interpretation: <1.31 ft/sec, indicative of household ambulator General Gait Details: supervision for lines and leads. Patient became SOB with ambualtion from the bathroom.   Stairs            Wheelchair Mobility    Modified  Rankin (Stroke Patients Only)       Balance Overall balance assessment: Needs assistance         Standing balance support: Bilateral upper extremity supported Standing balance-Leahy Scale: Poor                               Pertinent Vitals/Pain Pain Assessment: Faces Faces Pain Scale: Hurts even more Pain Location: left knee Pain Descriptors / Indicators: Aching Pain Intervention(s): Limited activity within patient's tolerance;Monitored during session    Home Living Family/patient expects to be discharged to:: Private residence Living Arrangements: Spouse/significant other;Children Available Help at Discharge: Family;Available 24 hours/day Type of Home: Apartment Home Access: Level entry     Home Layout: One level Home Equipment: Cane - single point;Walker - 2 wheels;Toilet riser;Shower seat;Walker - 4 wheels Additional Comments: uses a rollator     Prior Function Level of Independence: Independent with assistive device(s)         Comments: Patient reports he was able to walk and go sdhoppign with his wife. He is limited at baseline by left knee pain.      Hand Dominance   Dominant Hand: Right    Extremity/Trunk Assessment   Upper Extremity Assessment Upper Extremity Assessment: Overall WFL for tasks assessed    Lower Extremity Assessment Lower Extremity Assessment: LLE deficits/detail LLE Deficits / Details: limited left LE strength in his knee LLE: Unable to fully assess due to pain       Communication   Communication: No difficulties  Cognition Arousal/Alertness: Awake/alert  Behavior During Therapy: WFL for tasks assessed/performed Overall Cognitive Status: Within Functional Limits for tasks assessed                                        General Comments      Exercises     Assessment/Plan    PT Assessment Patient needs continued PT services  PT Problem List Decreased activity tolerance;Decreased strength        PT Treatment Interventions DME instruction;Gait training;Functional mobility training;Therapeutic activities;Therapeutic exercise;Cognitive remediation    PT Goals (Current goals can be found in the Care Plan section)  Acute Rehab PT Goals Patient Stated Goal: to go home  PT Goal Formulation: With patient Time For Goal Achievement: 01/23/20 Potential to Achieve Goals: Good    Frequency Min 2X/week   Barriers to discharge        Co-evaluation               AM-PAC PT "6 Clicks" Mobility  Outcome Measure Help needed turning from your back to your side while in a flat bed without using bedrails?: None Help needed moving from lying on your back to sitting on the side of a flat bed without using bedrails?: None Help needed moving to and from a bed to a chair (including a wheelchair)?: A Little Help needed standing up from a chair using your arms (e.g., wheelchair or bedside chair)?: A Little Help needed to walk in hospital room?: A Little Help needed climbing 3-5 steps with a railing? : A Lot 6 Click Score: 19    End of Session Equipment Utilized During Treatment: Gait belt Activity Tolerance: Patient tolerated treatment well Patient left: in bed(declined to get to chair) Nurse Communication: Mobility status;Need for lift equipment PT Visit Diagnosis: Other abnormalities of gait and mobility (R26.89)    Time: [id number] PT Time Calculation (min) (ACUTE ONLY): 19 min   Charges:   PT Evaluation $PT Eval Low Complexity: 1 Low         Carney Living PT DPT  01/16/2020, 10:44 AM

## 2020-01-17 ENCOUNTER — Ambulatory Visit: Payer: Self-pay | Admitting: Urology

## 2020-01-17 ENCOUNTER — Other Ambulatory Visit: Payer: Self-pay | Admitting: Urology

## 2020-01-17 LAB — CBC WITH DIFFERENTIAL/PLATELET
Abs Immature Granulocytes: 0.03 10*3/uL (ref 0.00–0.07)
Basophils Absolute: 0 10*3/uL (ref 0.0–0.1)
Basophils Relative: 0 %
Eosinophils Absolute: 0.2 10*3/uL (ref 0.0–0.5)
Eosinophils Relative: 2 %
HCT: 28.4 % — ABNORMAL LOW (ref 39.0–52.0)
Hemoglobin: 8.8 g/dL — ABNORMAL LOW (ref 13.0–17.0)
Immature Granulocytes: 0 %
Lymphocytes Relative: 12 %
Lymphs Abs: 1.2 10*3/uL (ref 0.7–4.0)
MCH: 29.9 pg (ref 26.0–34.0)
MCHC: 31 g/dL (ref 30.0–36.0)
MCV: 96.6 fL (ref 80.0–100.0)
Monocytes Absolute: 1.3 10*3/uL — ABNORMAL HIGH (ref 0.1–1.0)
Monocytes Relative: 13 %
Neutro Abs: 6.9 10*3/uL (ref 1.7–7.7)
Neutrophils Relative %: 73 %
Platelets: 294 10*3/uL (ref 150–400)
RBC: 2.94 MIL/uL — ABNORMAL LOW (ref 4.22–5.81)
RDW: 18.2 % — ABNORMAL HIGH (ref 11.5–15.5)
WBC: 9.5 10*3/uL (ref 4.0–10.5)
nRBC: 0 % (ref 0.0–0.2)

## 2020-01-17 LAB — BASIC METABOLIC PANEL
Anion gap: 5 (ref 5–15)
BUN: 18 mg/dL (ref 8–23)
CO2: 21 mmol/L — ABNORMAL LOW (ref 22–32)
Calcium: 7.8 mg/dL — ABNORMAL LOW (ref 8.9–10.3)
Chloride: 107 mmol/L (ref 98–111)
Creatinine, Ser: 1.29 mg/dL — ABNORMAL HIGH (ref 0.61–1.24)
GFR calc Af Amer: >60 mL/min (ref >60)
GFR calc non Af Amer: 55 mL/min — ABNORMAL LOW (ref >60)
Glucose, Bld: 109 mg/dL — ABNORMAL HIGH (ref 70–99)
Potassium: 3.8 mmol/L (ref 3.5–5.1)
Sodium: 133 mmol/L — ABNORMAL LOW (ref 135–145)

## 2020-01-17 NOTE — Progress Notes (Signed)
PROGRESS NOTE    [facility].  XIP:[id number] DOB: 1946-06-20 DOA: 01/12/2020 PCP: [name], MD   Brief Narrative:  Macauley Mossberg  is a 74 y.o. 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  Pt was apparently taking Bactrim for ? UTI.  Pt denied fever, chills, n/v, diarrhea, brbpr, black stool.   In ED,  Bp 98/71  Hgb 6.6, Plt 231  Creatinine 3.10, Na 127 Admitted for hematuria and AKI, seen by urology who plans conservative management for now, given 2 units blood and outpatient Urology follow up with imaging once renal function.  Assessment & Plan:   Principal Problem:   Anemia Active Problems:   Coronary artery disease involving native heart   Seizure (HCC)   Chronic systolic CHF (congestive heart failure), NYHA class 2 (HCC)   ARF (acute renal failure) (HCC)   Hyponatremia   Acute blood loss anemia   Acute blood loss anemia likely due to gross hematuria/moderate right hydronephrosis/hydroureter secondary to clot impaction: Presented with hemoglobin of 6.6.  Received 2 units of PRBC transfusion. hematuria due to right renal cyst hemorrhage.  Overall improving from hematuria and renal standpoint per urology Urology following recommend supportive care with no intervention at this point in time.   Urology outpatient follow up for cysto + CTU (pending Cr) as outpatient. Discussed with urology today.  Acute renal failure: Likely secondary to obstruction due to clot also causing hydronephrosis.Also possibly combo with Bactrim. Creatinine improving slowly, he has history of normal renal function  Continue hydration.   Monitor labs Avoid nephrotoxic agents.  Chronic hyponatremia: with mild improvement.  ? Multiple Myeloma (follows with Irene Limbo) Follow-up  as outpatient.  Chronic systolic congestive heart failure: Stable.  Euvolemic.  Continue home dose of carvedilol.  Continue to hold Lasix.  Paroxysmal atrial fibrillation: Continue amiodarone.  Not on any anticoagulation prior to admission.  CAD s/p CABG- had mild cp today while in bed. Will ck troponin.continue to monitor Cont Simvastatin 38m po qhs Cont Cavedilol as above  H/o stroke/ seizure disorder Cont Keppra 10031mpo bid Cont Dilantin Cont Depakote ER   BPH Cont Flomax 0.22m26mo qhs  ?Anxiety Cont Duloxetine  DVT prophylaxis: SCD Code Status: Full code Family Communication:  None present at bedside.   Disposition Plan: PT recommended home PT. Will be here 1-2 more days until creatinine improves , and ck am K to make sure improves after tx.     Consultants:   Urology  Procedures:   None  Antimicrobials:   None   Subjective: Patient had some chest pain while sitting.  No other complaints.  No shortness of breath.  Known chest pain with ambulation.  Objective: Vitals:   01/16/20 0926 01/16/20 1700 01/16/20 2110 01/17/20 0500  BP: 105/75 110/72 100/74 119/79  Pulse: 75 76 85 72  Resp: 18 18 18 18   Temp: 98.2 F (36.8 C) 98.2 F (36.8 C) (!) 97.5 F (36.4 C) 98.5 F (36.9 C)  TempSrc: Oral Oral  Oral  SpO2: 100% 98% 100% 100%  Weight:   85.6 kg 85.6 kg  Height:        Intake/Output Summary (Last 24 hours) at 01/17/2020 0920 Last data filed at 01/17/2020 0900 Gross per 24 hour  Intake 1840.03 ml  Output 1100 ml  Net 740.03 ml   Filed Weights   01/15/20 2110 01/16/20 2110 01/17/20 0500  Weight: 85.6 kg 85.6 kg 85.6 kg    Examination: General exam: Appears calm and comfortable , nad Respiratory system: Clear to auscultation. Respiratory effort normal. Cardiovascular system: S1 & S2 heard, RRR. No  murmurs, rubs, gallops or clicks.  Gastrointestinal system: Abdomen is nondistended, soft and nontender.  Normal bowel sounds heard. Central  nervous system: Alert and oriented. No focal neurological deficits. Extremities: No pedal edema. Skin: warm, dry Psychiatry: Judgement and insight appear normal. Mood & affect appropriate.   Data Reviewed: I have personally reviewed following labs and imaging studies  CBC: Recent Labs  Lab 01/12/20 1149 01/13/20 0802 01/13/20 0802 01/13/20 1801 01/14/20 0257 01/15/20 0404 01/16/20 0345 01/17/20 0536  WBC 3.5 4.4  --   --  5.1 4.7 4.9 9.5  NEUTROABS 1.7  --   --   --  2.7 2.3 2.7 6.9  HGB 6.6* 8.6*   < > 8.9* 8.2* 8.6* 8.5* 8.8*  HCT 20.0* 25.8*   < > 27.4* 24.6* 27.1* 27.2* 28.4*  MCV 92 90.5  --   --  89.8 92.8 95.4 96.6  PLT 231 196  --   --  221 242 203 294   < > = values in this interval not displayed.   Basic Metabolic Panel: Recent Labs  Lab 01/13/20 0802 01/14/20 0257 01/15/20 0404 01/16/20 0345 01/17/20 0536  NA 129* 131* 130* 131* 133*  K 4.1 4.3 4.6 5.6* 3.8  CL 99 102 102 105 107  CO2 22 23 24 22  21*  GLUCOSE 74 116* 74 88 109*  BUN 41* 39* 33* 28* 18  CREATININE 3.48* 3.19* 2.44* 1.85* 1.29*  CALCIUM 8.0* 7.8* 8.1* 7.8* 7.8*   GFR: Estimated Creatinine Clearance: 55.9 mL/min (A) (by C-G formula based on SCr of 1.29 mg/dL (H)). Liver Function Tests: Recent Labs  Lab 01/13/20 0802  AST 21  ALT 19  ALKPHOS 99  BILITOT 1.3*  PROT 8.8*  ALBUMIN 2.6*   No results for input(s): LIPASE, AMYLASE in the last 168 hours. No results for input(s): AMMONIA in the last 168 hours. Coagulation Profile: Recent Labs  Lab 01/13/20 0802  INR 1.2   Cardiac Enzymes: No results for input(s): CKTOTAL, CKMB, CKMBINDEX, TROPONINI in the last 168 hours. BNP (last 3 results) No results for input(s): PROBNP in the last 8760 hours. HbA1C: No results for input(s): HGBA1C in the last 72 hours. CBG: Recent Labs  Lab 01/13/20 0744 01/13/20 0904  GLUCAP 55* 106*   Lipid Profile: No results for input(s): CHOL, HDL, LDLCALC, TRIG, CHOLHDL, LDLDIRECT in the last 72  hours. Thyroid Function Tests: No results for input(s): TSH, T4TOTAL, FREET4, T3FREE, THYROIDAB in the last 72 hours. Anemia Panel: No results for input(s): VITAMINB12, FOLATE, FERRITIN, TIBC, IRON, RETICCTPCT in the last 72 hours. Sepsis Labs: No results for input(s): PROCALCITON, LATICACIDVEN in the last 168 hours.  Recent Results (from the past 240 hour(s))  SARS CORONAVIRUS 2 (TAT 6-24 HRS) Nasopharyngeal Nasopharyngeal Swab     Status: None   Collection Time: 01/12/20  9:14 PM   Specimen: Nasopharyngeal Swab  Result Value Ref Range Status   SARS Coronavirus 2 NEGATIVE NEGATIVE Final    Comment: (NOTE) SARS-CoV-2 target nucleic acids are NOT DETECTED. The SARS-CoV-2 RNA is generally detectable in upper and lower respiratory specimens during the acute phase of infection. Negative results do not preclude SARS-CoV-2 infection, do not  rule out co-infections with other pathogens, and should not be used as the sole basis for treatment or other patient management decisions. Negative results must be combined with clinical observations, patient history, and epidemiological information. The expected result is Negative. Fact Sheet for Patients: SugarRoll.be Fact Sheet for Healthcare Providers: https://www.woods-mathews.com/ This test is not yet approved or cleared by the [place] FDA and  has been authorized for detection and/or diagnosis of SARS-CoV-2 by FDA under an Emergency Use Authorization (EUA). This EUA will remain  in effect (meaning this test can be used) for the duration of the COVID-19 declaration under Section 56 4(b)(1) of the Act, 21 U.S.C. section 360bbb-3(b)(1), unless the authorization is terminated or revoked sooner. Performed at [facility], [place] [address] [place], [place] [address]   Urine culture     Status: Abnormal   Collection Time: 01/13/20 12:49 AM   Specimen: Urine, Clean Catch  Result Value Ref Range  Status   Specimen Description URINE, CLEAN CATCH  Final   Special Requests   Final    NONE Performed at [facility], [place] [address], [place], [place] [address]    Culture MULTIPLE SPECIES PRESENT, SUGGEST RECOLLECTION (A)  Final   Report Status 01/14/2020 FINAL  Final      Radiology Studies: No results found.  Scheduled Meds: . sodium chloride   Intravenous Once  . amiodarone  200 mg Oral Daily  . carvedilol  3.125 mg Oral BID WC  . divalproex  500 mg Oral QHS  . DULoxetine  30 mg Oral Daily  . ferrous sulfate  325 mg Oral Q breakfast  . levETIRAcetam  1,000 mg Oral BID  . phenytoin  100 mg Oral BID  . simvastatin  20 mg Oral q1800  . tamsulosin  0.4 mg Oral Daily   Continuous Infusions: . sodium chloride 100 mL/hr at 01/17/20 0700     LOS: 4 days   Time spent: 24 minutes with more than 50% COC   Avryl Roehm Marry Guan, MD [facility]  01/17/2020, 9:20 AM    Patient ID: Sonny Dandy., male   DOB: 14-Jul-1946, 74 y.o.   MRN: [id number]

## 2020-01-17 NOTE — Plan of Care (Signed)
  Problem: Education: Goal: Knowledge of General Education information will improve Description: Including pain rating scale, medication(s)/side effects and non-pharmacologic comfort measures Outcome: Progressing   Problem: Activity: Goal: Risk for activity intolerance will decrease Outcome: Progressing   

## 2020-01-17 NOTE — Progress Notes (Signed)
Urology Progress Note   74 year old male with anemia, hematuria, right ureteral obstruction from clot due to bleeding right renal cyst.    Interval: Hemoglobin stable, creatinine continues to improve.  Urine remains clear.  Objective: Vital signs in last 24 hours: Temp:  [97.5 F (36.4 C)-98.5 F (36.9 C)] 98.5 F (36.9 C) (01/24 0500) Pulse Rate:  [72-85] 72 (01/24 0500) Resp:  [18] 18 (01/24 0500) BP: (100-119)/(72-79) 119/79 (01/24 0500) SpO2:  [98 %-100 %] 100 % (01/24 0500) Weight:  [85.6 kg] 85.6 kg (01/24 0500)  Intake/Output from previous day: 01/23 0701 - 01/24 0700 In: 1840 [P.O.:540; I.V.:1300] Out: 1025 [Urine:1025] Intake/Output this shift: Total I/O In: 240 [P.O.:240] Out: 300 [Urine:300]  Physical Exam:  General: Alert and oriented CV: RRR Lungs: Normal work of breathing Abdomen: Soft, appropriately tender.  GU: Voiding volitionally, yellow urine without clot Ext: NT, No erythema  Lab Results: Recent Labs    01/15/20 0404 01/16/20 0345 01/17/20 0536  HGB 8.6* 8.5* 8.8*  HCT 27.1* 27.2* 28.4*   BMET Recent Labs    01/16/20 0345 01/17/20 0536  NA 131* 133*  K 5.6* 3.8  CL 105 107  CO2 22 21*  GLUCOSE 88 109*  BUN 28* 18  CREATININE 1.85* 1.29*  CALCIUM 7.8* 7.8*     Studies/Results:   Assessment/Plan:  74 y.o. male with hematuria due to right renal cyst hemorrhage.  Overall improving from hematuria and renal standpoint  -No need for urologic intervention -Follow-up visit outpatient to further characterize cysts and perform cystoscopy given hematuria.  We will push this back from originally scheduled 1/27 to about 2 weeks from now to give his creatinine further time to improve. Message sent to our front desk -Urology will sign off.   LOS: 4 days   Tharon Aquas 01/17/2020, 10:41 AM

## 2020-01-18 LAB — CBC
HCT: 26.8 % — ABNORMAL LOW (ref 39.0–52.0)
Hemoglobin: 8.2 g/dL — ABNORMAL LOW (ref 13.0–17.0)
MCH: 29.9 pg (ref 26.0–34.0)
MCHC: 30.6 g/dL (ref 30.0–36.0)
MCV: 97.8 fL (ref 80.0–100.0)
Platelets: 272 10*3/uL (ref 150–400)
RBC: 2.74 MIL/uL — ABNORMAL LOW (ref 4.22–5.81)
RDW: 18.2 % — ABNORMAL HIGH (ref 11.5–15.5)
WBC: 8.8 10*3/uL (ref 4.0–10.5)
nRBC: 0 % (ref 0.0–0.2)

## 2020-01-18 LAB — TROPONIN I (HIGH SENSITIVITY): Troponin I (High Sensitivity): 9 ng/L (ref <18)

## 2020-01-18 LAB — BASIC METABOLIC PANEL
Anion gap: 5 (ref 5–15)
BUN: 12 mg/dL (ref 8–23)
CO2: 24 mmol/L (ref 22–32)
Calcium: 7.6 mg/dL — ABNORMAL LOW (ref 8.9–10.3)
Chloride: 105 mmol/L (ref 98–111)
Creatinine, Ser: 1.21 mg/dL (ref 0.61–1.24)
GFR calc Af Amer: >60 mL/min (ref >60)
GFR calc non Af Amer: 60 mL/min — ABNORMAL LOW (ref >60)
Glucose, Bld: 79 mg/dL (ref 70–99)
Potassium: 4.4 mmol/L (ref 3.5–5.1)
Sodium: 134 mmol/L — ABNORMAL LOW (ref 135–145)

## 2020-01-18 NOTE — Progress Notes (Signed)
DISCHARGE NOTE HOME [facility]. to be discharged Home per MD order. Discussed prescriptions and follow up appointments with the patient. Prescriptions given to patient; medication list explained in detail. Patient verbalized understanding.  Skin clean, dry and intact without evidence of skin break down, no evidence of skin tears noted. IV catheter discontinued intact. Site without signs and symptoms of complications. Dressing and pressure applied. Pt denies pain at the site currently. No complaints noted.  Patient free of lines, drains, and wounds.   An After Visit Summary (AVS) was printed and given to the patient. Patient escorted via wheelchair, and discharged home via private auto.  Arlyss Repress, RN

## 2020-01-18 NOTE — Discharge Summary (Signed)
Physician Discharge Summary  [facility]. [id number] DOB: February 05, 1946 DOA: 01/12/2020  PCP: [name], MD  Admit date: 01/12/2020  Discharge date: 01/18/2020  Admitted From:Home  Disposition:  Home  Recommendations for Outpatient Follow-up:  1. Follow up with PCP in 1-2 weeks 2. Follow up with Urology in 2 weeks as scheduled 3. 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  Pt was apparently taking Bactrim for ? UTI. Pt denied fever, chills, n/v, diarrhea, brbpr, black stool.  In ED,  Bp 98/71 Hgb 6.6, Plt 231  Creatinine 3.10, Na 127 Admitted for hematuria and AKI, seen by urology who plans conservative management for now, given 2 units blood and outpatient Urology follow up with imaging once renal function.  Acute blood loss anemia likely due to gross hematuria/moderate right hydronephrosis/hydroureter secondary to clot impaction: Presented with hemoglobin of 6.6.  Received 2 units of PRBC transfusion. hematuria due to right renal cyst hemorrhage. Overall improving from hematuria and renal standpoint per urology Urology following recommend supportive care with no intervention at this point in time.   Urology outpatient follow up for cysto + CTU (pending Cr) as outpatient.  Acute renal failure: Likely secondary to obstruction due to clot also causing hydronephrosis.Also possibly combo with Bactrim. Creatinine improved-follow up outpatient  Chronic hyponatremia: with mild improvement.  ? Multiple Myeloma (follows with  Irene Limbo) Follow-up as outpatient.  Chronic systolic congestive heart failure: Stable.  Euvolemic.  Continue home dose of carvedilol.  Continue to hold Lasix.  Paroxysmal atrial fibrillation: Continue amiodarone.  Not on any anticoagulation prior to admission.  CAD s/p CABG- had mild cp today while in bed. Will ck troponin.continue to monitor Cont Simvastatin 25m po qhs Cont Cavedilol as above  H/o stroke/ seizure disorder Cont Keppra 10019mpo bid Cont Dilantin Cont Depakote ER   BPH Cont Flomax 0.70m37mo qhs  ?Anxiety Cont Duloxetine  Discharge Diagnoses:  Principal Problem:   Anemia Active Problems:   Coronary artery disease involving native heart   Seizure (HCC)   Chronic systolic CHF (congestive heart failure), NYHA class 2 (HCC)   ARF (acute renal failure) (HCC)   Hyponatremia   Acute blood loss anemia    Discharge Instructions  Discharge Instructions    Diet - low sodium heart healthy   Complete by: As directed    Increase activity slowly   Complete by: As directed      Allergies as of 01/18/2020   No Known Allergies     Medication List    STOP taking these medications   sulfamethoxazole-trimethoprim 800-160 MG tablet Commonly known as: BACTRIM DS     TAKE these medications   acetaminophen 325 MG tablet Commonly known as: TYLENOL Take 650 mg by mouth every 6 (six) hours as needed for mild pain.   amiodarone 200 MG tablet Commonly known as: PACERONE Take 1 tablet by mouth once daily   carvedilol 3.125 MG tablet Commonly known as: COREG Take 1 tablet (3.125 mg total) by mouth 2 (two) times daily with a meal.   divalproex 500 MG 24 hr tablet Commonly known as: DEPAKOTE ER Take 1 tablet  every night What changed:   how much to take  how to take this  when to take this  additional instructions   DULoxetine 30 MG capsule Commonly known as: CYMBALTA Take 1 capsule by mouth once daily   feeding supplement (ENSURE ENLIVE) Liqd Take  237 mLs by mouth 3 (three) times daily between meals.   ferrous sulfate 325 (65 FE) MG tablet Take 1 tablet (325 mg total) by mouth 2 (two) times daily with a meal. What changed: when to take this   furosemide 40 MG tablet Commonly known as: LASIX Take 1 tablet by mouth once daily   levETIRAcetam 1000 MG tablet Commonly known as: KEPPRA Take 1 tablet (1,000 mg total) by mouth 2 (two) times daily.   phenytoin 100 MG ER capsule Commonly known as: Dilantin Take 1 capsule (100 mg total) by mouth 2 (two) times daily.   polyethylene glycol 17 g packet Commonly known as: MIRALAX / GLYCOLAX Take 17 g by mouth daily as needed for mild constipation or moderate constipation.   simvastatin 20 MG tablet Commonly known as: ZOCOR TAKE 1 TABLET BY MOUTH ONCE DAILY AT 6PM What changed:   how much to take  how to take this  when to take this  additional instructions   tamsulosin 0.4 MG Caps capsule Commonly known as: FLOMAX Take 1 capsule by mouth once daily   traMADol 50 MG tablet Commonly known as: ULTRAM Take 1 tablet (50 mg total) by mouth every 6 (six) hours as needed for moderate pain. for pain      Follow-up Information    [name], MD.   Specialty: Urology Why: My office should be calling you to reschedule your CT scan and office visit with me. Contact information: [place] [address] [phone]        [name], MD Follow up in 1 week(s).   Specialty: Internal Medicine Contact information: [place] [address] [phone]          No Known Allergies  Consultations:  Urology   Procedures/Studies: DG Chest 2 View  Result Date: 01/12/2020 CLINICAL DATA:  Chest pain, shortness of breath and dizziness. EXAM: CHEST - 2 VIEW COMPARISON:  November 13, 2019 FINDINGS: Multiple sternal wires and vascular clips are seen. Radiopaque surgical clips are again seen overlying the perihilar region on the left.  Mild, stable scarring and/or atelectasis is seen within the retrocardiac region of the left lung base. There is no evidence of a pleural effusion or pneumothorax. The heart size and mediastinal contours are within normal limits. No acute osseous abnormalities are seen involving the visualized skeletal structures. IMPRESSION: 1. Evidence of prior median sternotomy/CABG. 2. Mild, stable left basilar scarring and/or atelectasis. Electronically Signed   By: Virgina Norfolk M.D.   On: 01/12/2020 20:01   CT Renal Stone Study  Result Date: 01/12/2020 CLINICAL DATA:  Hematuria EXAM: CT ABDOMEN AND PELVIS WITHOUT CONTRAST TECHNIQUE: Multidetector CT imaging of the abdomen and pelvis was performed following the standard protocol without IV contrast. COMPARISON:  CT 04/21/2019 FINDINGS: Lower chest: Lung bases demonstrate no acute consolidation or pleural effusion. Borderline cardiomegaly. Coarse pericardial calcification. Hepatobiliary: No focal hepatic abnormality. Increased intraluminal density within the gallbladder. No biliary dilatation Pancreas: Unremarkable. No pancreatic ductal dilatation or surrounding inflammatory changes. Spleen: Normal in size without focal abnormality. Adrenals/Urinary Tract: Adrenal glands are normal. Bilateral renal cystic disease with some of the cysts containing peripheral calcification and possible septa. New moderate  right hydronephrosis. New hydroureter. Hyperdensity within the distended right ureter extending from the UPJ to the distal ureter at approximate S1 level after which the ureter becomes decompressed. No calcified stone. The bladder is unremarkable. Stomach/Bowel: Stomach is within normal limits. Appendix appears normal. No evidence of bowel wall thickening, distention, or inflammatory changes. Vascular/Lymphatic: Moderate severe aortic atherosclerosis without aneurysm. No significant adenopathy Reproductive: Prostate is unremarkable. Other: Negative for free air or free  fluid. Musculoskeletal: Interval age indeterminate mild superior endplate fracture at L1. IMPRESSION: 1. Interim development of moderate right hydronephrosis and proximal hydroureter. Hyperdense material expanding the right ureter from the UPJ to the distal ureter at the level of upper sacrum, suspicious for blood clot/hemorrhagic material. No definitive stone disease. Findings could be secondary to intraureteral hemorrhage secondary to trauma, underlying neoplasm, or anticoagulation. 2. Increased intraluminal density within the gallbladder, possible sludge or small stones 3. Bilateral renal cystic disease 4. Interval age indeterminate mild superior endplate deformity at L1 Electronically Signed   By: Donavan Foil M.D.   On: 01/12/2020 21:14     Discharge Exam: Vitals:   01/18/20 0510 01/18/20 0911  BP: 111/81 110/74  Pulse: 84 86  Resp: 18 18  Temp:  98.1 F (36.7 C)  SpO2: 100% 99%   Vitals:   01/17/20 1653 01/17/20 2046 01/18/20 0510 01/18/20 0911  BP: 99/65 106/68 111/81 110/74  Pulse: 82 78 84 86  Resp: 18 18 18 18   Temp: 97.9 F (36.6 C) 98.9 F (37.2 C)  98.1 F (36.7 C)  TempSrc: Oral Oral  Oral  SpO2: 100% 100% 100% 99%  Weight:  85.6 kg    Height:        General: Pt is alert, awake, not in acute distress Cardiovascular: RRR, S1/S2 +, no rubs, no gallops Respiratory: CTA bilaterally, no wheezing, no rhonchi Abdominal: Soft, NT, ND, bowel sounds + Extremities: no edema, no cyanosis    The results of significant diagnostics from this hospitalization (including imaging, microbiology, ancillary and laboratory) are listed below for reference.     Microbiology: Recent Results (from the past 240 hour(s))  SARS CORONAVIRUS 2 (TAT 6-24 HRS) Nasopharyngeal Nasopharyngeal Swab     Status: None   Collection Time: 01/12/20  9:14 PM   Specimen: Nasopharyngeal Swab  Result Value Ref Range Status   SARS Coronavirus 2 NEGATIVE NEGATIVE Final    Comment: (NOTE) SARS-CoV-2  target nucleic acids are NOT DETECTED. The SARS-CoV-2 RNA is generally detectable in upper and lower respiratory specimens during the acute phase of infection. Negative results do not preclude SARS-CoV-2 infection, do not rule out co-infections with other pathogens, and should not be used as the sole basis for treatment or other patient management decisions. Negative results must be combined with clinical observations, patient history, and epidemiological information. The expected result is Negative. Fact Sheet for Patients: SugarRoll.be Fact Sheet for Healthcare Providers: https://www.woods-mathews.com/ This test is not yet approved or cleared by the [place] FDA and  has been authorized for detection and/or diagnosis of SARS-CoV-2 by FDA under an Emergency Use Authorization (EUA). This EUA will remain  in effect (meaning this test can be used) for the duration of the COVID-19 declaration under Section 56 4(b)(1) of the Act, 21 U.S.C. section 360bbb-3(b)(1), unless the authorization is terminated or revoked sooner. Performed at [facility], [place] [address], [place], [place] [address]   Urine culture     Status: Abnormal   Collection Time: 01/13/20 12:49 AM   Specimen: Urine,  Clean Catch  Result Value Ref Range Status   Specimen Description URINE, CLEAN CATCH  Final   Special Requests   Final    NONE Performed at [facility], [address] [place], [place] [address]    Culture MULTIPLE SPECIES PRESENT, SUGGEST RECOLLECTION (A)  Final   Report Status 01/14/2020 FINAL  Final     Labs: BNP (last 3 results) Recent Labs    07/21/19 1129  BNP 01.7   Basic Metabolic Panel: Recent Labs  Lab 01/14/20 0257 01/15/20 0404 01/16/20 0345 01/17/20 0536 01/18/20 0651  NA 131* 130* 131* 133* 134*  K 4.3 4.6 5.6* 3.8 4.4  CL 102 102 105 107 105  CO2 23 24 22  21* 24  GLUCOSE 116* 74 88 109* 79  BUN 39* 33* 28* 18 12   CREATININE 3.19* 2.44* 1.85* 1.29* 1.21  CALCIUM 7.8* 8.1* 7.8* 7.8* 7.6*   Liver Function Tests: Recent Labs  Lab 01/13/20 0802  AST 21  ALT 19  ALKPHOS 99  BILITOT 1.3*  PROT 8.8*  ALBUMIN 2.6*   No results for input(s): LIPASE, AMYLASE in the last 168 hours. No results for input(s): AMMONIA in the last 168 hours. CBC: Recent Labs  Lab 01/12/20 1149 01/13/20 0802 01/14/20 0257 01/15/20 0404 01/16/20 0345 01/17/20 0536 01/18/20 0651  WBC 3.5   < > 5.1 4.7 4.9 9.5 8.8  NEUTROABS 1.7  --  2.7 2.3 2.7 6.9  --   HGB 6.6*   < > 8.2* 8.6* 8.5* 8.8* 8.2*  HCT 20.0*   < > 24.6* 27.1* 27.2* 28.4* 26.8*  MCV 92   < > 89.8 92.8 95.4 96.6 97.8  PLT 231   < > 221 242 203 294 272   < > = values in this interval not displayed.   Cardiac Enzymes: No results for input(s): CKTOTAL, CKMB, CKMBINDEX, TROPONINI in the last 168 hours. BNP: Invalid input(s): POCBNP CBG: Recent Labs  Lab 01/13/20 0744 01/13/20 0904  GLUCAP 55* 106*   D-Dimer No results for input(s): DDIMER in the last 72 hours. Hgb A1c No results for input(s): HGBA1C in the last 72 hours. Lipid Profile No results for input(s): CHOL, HDL, LDLCALC, TRIG, CHOLHDL, LDLDIRECT in the last 72 hours. Thyroid function studies No results for input(s): TSH, T4TOTAL, T3FREE, THYROIDAB in the last 72 hours.  Invalid input(s): FREET3 Anemia work up No results for input(s): VITAMINB12, FOLATE, FERRITIN, TIBC, IRON, RETICCTPCT in the last 72 hours. Urinalysis    Component Value Date/Time   COLORURINE AMBER (A) 01/13/2020 0049   APPEARANCEUR CLOUDY (A) 01/13/2020 0049   LABSPEC 1.012 01/13/2020 0049   PHURINE 6.0 01/13/2020 0049   GLUCOSEU NEGATIVE 01/13/2020 0049   GLUCOSEU NEGATIVE 12/11/2019 1134   HGBUR MODERATE (A) 01/13/2020 0049   BILIRUBINUR NEGATIVE 01/13/2020 0049   BILIRUBINUR Neg 11/27/2017 1032   KETONESUR NEGATIVE 01/13/2020 0049   PROTEINUR 100 (A) 01/13/2020 0049   UROBILINOGEN 0.2 12/11/2019 1134    NITRITE NEGATIVE 01/13/2020 0049   LEUKOCYTESUR TRACE (A) 01/13/2020 0049   Sepsis Labs Invalid input(s): PROCALCITONIN,  WBC,  LACTICIDVEN Microbiology Recent Results (from the past 240 hour(s))  SARS CORONAVIRUS 2 (TAT 6-24 HRS) Nasopharyngeal Nasopharyngeal Swab     Status: None   Collection Time: 01/12/20  9:14 PM   Specimen: Nasopharyngeal Swab  Result Value Ref Range Status   SARS Coronavirus 2 NEGATIVE NEGATIVE Final    Comment: (NOTE) SARS-CoV-2 target nucleic acids are NOT DETECTED. The SARS-CoV-2 RNA is  generally detectable in upper and lower respiratory specimens during the acute phase of infection. Negative results do not preclude SARS-CoV-2 infection, do not rule out co-infections with other pathogens, and should not be used as the sole basis for treatment or other patient management decisions. Negative results must be combined with clinical observations, patient history, and epidemiological information. The expected result is Negative. Fact Sheet for Patients: SugarRoll.be Fact Sheet for Healthcare Providers: https://www.woods-mathews.com/ This test is not yet approved or cleared by the [place] FDA and  has been authorized for detection and/or diagnosis of SARS-CoV-2 by FDA under an Emergency Use Authorization (EUA). This EUA will remain  in effect (meaning this test can be used) for the duration of the COVID-19 declaration under Section 56 4(b)(1) of the Act, 21 U.S.C. section 360bbb-3(b)(1), unless the authorization is terminated or revoked sooner. Performed at [facility], [place] [address], [place], [place] [address]   Urine culture     Status: Abnormal   Collection Time: 01/13/20 12:49 AM   Specimen: Urine, Clean Catch  Result Value Ref Range Status   Specimen Description URINE, CLEAN CATCH  Final   Special Requests   Final    NONE Performed at [facility], [place] [address] [place], [place]  [address]    Culture MULTIPLE SPECIES PRESENT, SUGGEST RECOLLECTION (A)  Final   Report Status 01/14/2020 FINAL  Final     Time coordinating discharge: 35 minutes  SIGNED:   Rodena Goldmann, DO [facility] 01/18/2020, 1:17 PM  If 7PM-7AM, please contact night-coverage [email or web address]

## 2020-01-18 NOTE — Progress Notes (Signed)
PT Cancellation Note  Patient Details Name: [facility]. MRN: [id number] DOB: 08/18/1946   Cancelled Treatment:    Reason Eval/Treat Not Completed: Medical issues which prohibited therapy;Fatigue/lethargy limiting ability to participate.  Reports he feels too bad today and asked to wait til tomorrow.  Will try again at another time.   Ramond Dial 01/18/2020, 12:54 PM   Mee Hives, PT MS Acute Rehab Dept. Number: [place] and [place]

## 2020-01-18 NOTE — TOC Transition Note (Signed)
Transition of Care [facility]) - CM/SW Discharge Note   Patient Details  Name: [facility]. MRN: [id number] Date of Birth: 01/18/1946  Transition of Care [facility]) CM/SW Contact:  Bartholomew Crews, RN Phone Number: [phone] 01/18/2020, 1:16 PM   Clinical Narrative:    Spoke with patient at the bedside. Patient not interactive. Attempted to discuss [place] recommendations. Patient stating that his wife is not home and he has no ride and no one else.   Spoke with wife on the phone to discuss readiness to transition home. She is home and she will pick him up. Advised that she will be at the hospital about 2:15 pm. Discussed HH needs. She stated that patient has RN with [facility], but that he was recently discharged from PT. Advised of new orders with recent hospitalization. Wife in agreement. Spoke with Tiffany at [place] [facility] - patient is active for RN, PT, and OT. MD advised of needed [place] orders with [facility].   TOC team following.    Final next level of care: Home w Home Health Services Barriers to Discharge: No Barriers Identified   Patient Goals and CMS Choice   CMS Medicare.gov Compare Post Acute Care list provided to:: Patient Represenative (must comment)(spouse - Mardene Celeste) Choice offered to / list presented to : Spouse  Discharge Placement                  Name of family member notified: Marcell Barlow Patient and family notified of of transfer: 01/18/20  Discharge Plan and Services                DME Arranged: N/A DME Agency: NA       HH Arranged: RN, PT, OT [place] Agency: [facility] (formerly [employer]) Date [place]: 01/18/20 Time [place]: 60 Representative spoke with at [place]: [place] ([place]) Interventions     Readmission Risk Interventions Readmission Risk Prevention Plan 11/20/2019 08/07/2019  Transportation Screening Complete Complete  PCP or Specialist Appt within 5-7 Days  Complete -  PCP or Specialist Appt within 3-5 Days - Complete  Home Care Screening Complete -  Medication Review (RN CM) Complete -  HRI or [place] - Not Complete  HRI or Home Care Consult comments - To SNF  Social Work Consult for [place] Planning/Counseling - Not Complete  SW consult not completed comments - NA  Palliative Care Screening - Not Applicable  Medication Review Press photographer) - Complete  Some recent data might be hidden

## 2020-01-19 ENCOUNTER — Telehealth: Payer: Self-pay | Admitting: *Deleted

## 2020-01-19 NOTE — Telephone Encounter (Signed)
Transition Care Management Follow-up Telephone Call   Date discharged? 01/18/20   How have you been since you were released from the hospital? Spoke w/pt wife. She asked pt how he felt and pt states he is felling fine   Do you understand why you were in the hospital? YES   Do you understand the discharge instructions? YES   Where were you discharged to? HOME   Items Reviewed:  Medications reviewed: YES, no changes  Allergies reviewed: YES  Dietary changes reviewed: YES, heart healthy  Referrals reviewed: No referral recommended, but wife states he has appt w/his cancer doctor this  Friday in [place]:   Activities of Daily Living (ADLs):   She states he are independent in the following: ambulation, bathing and hygiene, feeding, continence, grooming, toileting and dressing States he doesn't require assistance    Any transportation issues/concerns?: NO   Any patient concerns? NO   Confirmed importance and date/time of follow-up visits scheduled YES, appt 01/28/20  Provider Appointment booked with Dr. Sharlet [place]  Confirmed with patient if condition begins to worsen call PCP or go to the ER.  Patient was given the office number and encouraged to call back with question or concerns.  : YES

## 2020-01-21 ENCOUNTER — Telehealth: Payer: Self-pay | Admitting: Internal Medicine

## 2020-01-21 DIAGNOSIS — I2510 Atherosclerotic heart disease of native coronary artery without angina pectoris: Secondary | ICD-10-CM | POA: Diagnosis not present

## 2020-01-21 DIAGNOSIS — D631 Anemia in chronic kidney disease: Secondary | ICD-10-CM | POA: Diagnosis not present

## 2020-01-21 DIAGNOSIS — E785 Hyperlipidemia, unspecified: Secondary | ICD-10-CM | POA: Diagnosis not present

## 2020-01-21 DIAGNOSIS — Z8673 Personal history of transient ischemic attack (TIA), and cerebral infarction without residual deficits: Secondary | ICD-10-CM | POA: Diagnosis not present

## 2020-01-21 DIAGNOSIS — G4700 Insomnia, unspecified: Secondary | ICD-10-CM | POA: Diagnosis not present

## 2020-01-21 DIAGNOSIS — M1712 Unilateral primary osteoarthritis, left knee: Secondary | ICD-10-CM | POA: Diagnosis not present

## 2020-01-21 DIAGNOSIS — I130 Hypertensive heart and chronic kidney disease with heart failure and stage 1 through stage 4 chronic kidney disease, or unspecified chronic kidney disease: Secondary | ICD-10-CM | POA: Diagnosis not present

## 2020-01-21 DIAGNOSIS — I255 Ischemic cardiomyopathy: Secondary | ICD-10-CM | POA: Diagnosis not present

## 2020-01-21 DIAGNOSIS — M109 Gout, unspecified: Secondary | ICD-10-CM | POA: Diagnosis not present

## 2020-01-21 DIAGNOSIS — D689 Coagulation defect, unspecified: Secondary | ICD-10-CM | POA: Diagnosis not present

## 2020-01-21 DIAGNOSIS — R296 Repeated falls: Secondary | ICD-10-CM | POA: Diagnosis not present

## 2020-01-21 DIAGNOSIS — M6281 Muscle weakness (generalized): Secondary | ICD-10-CM | POA: Diagnosis not present

## 2020-01-21 DIAGNOSIS — I4891 Unspecified atrial fibrillation: Secondary | ICD-10-CM | POA: Diagnosis not present

## 2020-01-21 DIAGNOSIS — N183 Chronic kidney disease, stage 3 unspecified: Secondary | ICD-10-CM | POA: Diagnosis not present

## 2020-01-21 DIAGNOSIS — Z48812 Encounter for surgical aftercare following surgery on the circulatory system: Secondary | ICD-10-CM | POA: Diagnosis not present

## 2020-01-21 DIAGNOSIS — I5022 Chronic systolic (congestive) heart failure: Secondary | ICD-10-CM | POA: Diagnosis not present

## 2020-01-21 DIAGNOSIS — Z4801 Encounter for change or removal of surgical wound dressing: Secondary | ICD-10-CM | POA: Diagnosis not present

## 2020-01-21 NOTE — Telephone Encounter (Signed)
   April from [facility] requesting orders 2x wk for 2 weeks 1x wk for 1 Week 1 PRN   Please call April at [phone]

## 2020-01-22 DIAGNOSIS — D699 Hemorrhagic condition, unspecified: Secondary | ICD-10-CM | POA: Diagnosis not present

## 2020-01-22 NOTE — Telephone Encounter (Signed)
fine

## 2020-01-22 NOTE — Telephone Encounter (Signed)
Notified April w/ MD response../lmb 

## 2020-01-26 DIAGNOSIS — I5022 Chronic systolic (congestive) heart failure: Secondary | ICD-10-CM | POA: Diagnosis not present

## 2020-01-26 DIAGNOSIS — M109 Gout, unspecified: Secondary | ICD-10-CM | POA: Diagnosis not present

## 2020-01-26 DIAGNOSIS — Z8673 Personal history of transient ischemic attack (TIA), and cerebral infarction without residual deficits: Secondary | ICD-10-CM | POA: Diagnosis not present

## 2020-01-26 DIAGNOSIS — I2510 Atherosclerotic heart disease of native coronary artery without angina pectoris: Secondary | ICD-10-CM | POA: Diagnosis not present

## 2020-01-26 DIAGNOSIS — Z9181 History of falling: Secondary | ICD-10-CM | POA: Diagnosis not present

## 2020-01-26 DIAGNOSIS — G4700 Insomnia, unspecified: Secondary | ICD-10-CM | POA: Diagnosis not present

## 2020-01-26 DIAGNOSIS — I130 Hypertensive heart and chronic kidney disease with heart failure and stage 1 through stage 4 chronic kidney disease, or unspecified chronic kidney disease: Secondary | ICD-10-CM | POA: Diagnosis not present

## 2020-01-26 DIAGNOSIS — E785 Hyperlipidemia, unspecified: Secondary | ICD-10-CM | POA: Diagnosis not present

## 2020-01-26 DIAGNOSIS — I255 Ischemic cardiomyopathy: Secondary | ICD-10-CM | POA: Diagnosis not present

## 2020-01-26 DIAGNOSIS — I4891 Unspecified atrial fibrillation: Secondary | ICD-10-CM | POA: Diagnosis not present

## 2020-01-26 DIAGNOSIS — N2889 Other specified disorders of kidney and ureter: Secondary | ICD-10-CM | POA: Diagnosis not present

## 2020-01-26 DIAGNOSIS — N183 Chronic kidney disease, stage 3 unspecified: Secondary | ICD-10-CM | POA: Diagnosis not present

## 2020-01-26 DIAGNOSIS — D631 Anemia in chronic kidney disease: Secondary | ICD-10-CM | POA: Diagnosis not present

## 2020-01-26 DIAGNOSIS — M1712 Unilateral primary osteoarthritis, left knee: Secondary | ICD-10-CM | POA: Diagnosis not present

## 2020-01-28 ENCOUNTER — Other Ambulatory Visit: Payer: Self-pay

## 2020-01-28 ENCOUNTER — Encounter: Payer: Self-pay | Admitting: Internal Medicine

## 2020-01-28 ENCOUNTER — Ambulatory Visit (INDEPENDENT_AMBULATORY_CARE_PROVIDER_SITE_OTHER): Payer: Medicare Other | Admitting: Internal Medicine

## 2020-01-28 VITALS — BP 130/82 | Temp 98.3°F | Ht 71.0 in | Wt 189.2 lb

## 2020-01-28 DIAGNOSIS — D62 Acute posthemorrhagic anemia: Secondary | ICD-10-CM | POA: Diagnosis not present

## 2020-01-28 DIAGNOSIS — D689 Coagulation defect, unspecified: Secondary | ICD-10-CM

## 2020-01-28 DIAGNOSIS — N179 Acute kidney failure, unspecified: Secondary | ICD-10-CM

## 2020-01-28 LAB — CBC
HCT: 31.5 % — ABNORMAL LOW (ref 39.0–52.0)
Hemoglobin: 10 g/dL — ABNORMAL LOW (ref 13.0–17.0)
MCHC: 31.8 g/dL (ref 30.0–36.0)
MCV: 95.1 fl (ref 78.0–100.0)
Platelets: 297 10*3/uL (ref 150.0–400.0)
RBC: 3.31 Mil/uL — ABNORMAL LOW (ref 4.22–5.81)
RDW: 19.2 % — ABNORMAL HIGH (ref 11.5–15.5)
WBC: 4 10*3/uL (ref 4.0–10.5)

## 2020-01-28 LAB — COMPREHENSIVE METABOLIC PANEL
ALT: 25 U/L (ref 0–53)
AST: 28 U/L (ref 0–37)
Albumin: 3.1 g/dL — ABNORMAL LOW (ref 3.5–5.2)
Alkaline Phosphatase: 113 U/L (ref 39–117)
BUN: 25 mg/dL — ABNORMAL HIGH (ref 6–23)
CO2: 26 mEq/L (ref 19–32)
Calcium: 8.3 mg/dL — ABNORMAL LOW (ref 8.4–10.5)
Chloride: 105 mEq/L (ref 96–112)
Creatinine, Ser: 1.33 mg/dL (ref 0.40–1.50)
GFR: 64.1 mL/min (ref >60.00)
Glucose, Bld: 94 mg/dL (ref 70–99)
Potassium: 3.9 mEq/L (ref 3.5–5.1)
Sodium: 133 mEq/L — ABNORMAL LOW (ref 135–145)
Total Bilirubin: 0.2 mg/dL (ref 0.2–1.2)
Total Protein: 10.3 g/dL — ABNORMAL HIGH (ref 6.0–8.3)

## 2020-01-28 NOTE — Assessment & Plan Note (Signed)
Checking CBC and adjust as needed. He will keep follow up with urology also. Has had visit with [facility] hematology/oncology but that note does not appear to be complete so it is unclear if they have reached any conclusions about his bleeding disorder.

## 2020-01-28 NOTE — Progress Notes (Signed)
   Subjective:   Patient ID: Louis Ford., male    DOB: July 20, 1946, 74 y.o.   MRN: [id number]  HPI The patient is a 74 YO man coming in for hospital follow up (in for AKI and gross hematuria, transfused 2 units PRBC, Cr back close to normal prior to D/C). He will have follow up with urology as he did have hydronephrosis and clot in the urinary system and right renal hemorrhage. He is not having any blood in urine since discharge. Denies pain or burning with urination. Normal bowels and no blood there either. Denies chest pains. Some SOB with a lot of exertion but not at rest or with lying down. Denies nausea or vomiting.   PMH, [facility], social history reviewed and updated  Review of Systems  Constitutional: Positive for activity change, appetite change and fatigue.  HENT: Negative.   Eyes: Negative.   Respiratory: Positive for shortness of breath. Negative for cough and chest tightness.   Cardiovascular: Negative for chest pain, palpitations and leg swelling.  Gastrointestinal: Negative for abdominal distention, abdominal pain, constipation, diarrhea, nausea and vomiting.  Musculoskeletal: Negative.   Skin: Negative.   Neurological: Negative.   Psychiatric/Behavioral: Negative.     Objective:  Physical Exam Constitutional:      Appearance: He is well-developed.  HENT:     Head: Normocephalic and atraumatic.  Cardiovascular:     Rate and Rhythm: Normal rate and regular rhythm.  Pulmonary:     Effort: Pulmonary effort is normal. No respiratory distress.     Breath sounds: Normal breath sounds. No wheezing or rales.  Abdominal:     General: Bowel sounds are normal. There is no distension.     Palpations: Abdomen is soft.     Tenderness: There is no abdominal tenderness. There is no rebound.  Musculoskeletal:     Cervical back: Normal range of motion.  Skin:    General: Skin is warm and dry.  Neurological:     Mental Status: He is alert and oriented to person, place, and time.     Coordination: Coordination abnormal.     Comments: walker     Vitals:   01/28/20 1330  BP: 130/82  Temp: 98.3 F (36.8 C)  TempSrc: Oral  Weight: 189 lb 4 oz (85.8 kg)  Height: 5\' 11"  (1.803 m)    This visit occurred during the SARS-CoV-2 public health emergency.  Safety protocols were in place, including screening questions prior to the visit, additional usage of staff PPE, and extensive cleaning of exam room while observing appropriate contact time as indicated for disinfecting solutions.   Assessment & Plan:

## 2020-01-28 NOTE — Patient Instructions (Signed)
We are checking the labs today.  

## 2020-01-28 NOTE — Assessment & Plan Note (Signed)
Checking CMP today and adjust as needed. Will keep follow up with urology as well.

## 2020-01-29 ENCOUNTER — Other Ambulatory Visit: Payer: Self-pay | Admitting: Neurology

## 2020-01-29 DIAGNOSIS — M109 Gout, unspecified: Secondary | ICD-10-CM | POA: Diagnosis not present

## 2020-01-29 DIAGNOSIS — N183 Chronic kidney disease, stage 3 unspecified: Secondary | ICD-10-CM | POA: Diagnosis not present

## 2020-01-29 DIAGNOSIS — N2889 Other specified disorders of kidney and ureter: Secondary | ICD-10-CM | POA: Diagnosis not present

## 2020-01-29 DIAGNOSIS — Z8673 Personal history of transient ischemic attack (TIA), and cerebral infarction without residual deficits: Secondary | ICD-10-CM | POA: Diagnosis not present

## 2020-01-29 DIAGNOSIS — E785 Hyperlipidemia, unspecified: Secondary | ICD-10-CM | POA: Diagnosis not present

## 2020-01-29 DIAGNOSIS — M1712 Unilateral primary osteoarthritis, left knee: Secondary | ICD-10-CM | POA: Diagnosis not present

## 2020-01-29 DIAGNOSIS — Z9181 History of falling: Secondary | ICD-10-CM | POA: Diagnosis not present

## 2020-01-29 DIAGNOSIS — G40209 Localization-related (focal) (partial) symptomatic epilepsy and epileptic syndromes with complex partial seizures, not intractable, without status epilepticus: Secondary | ICD-10-CM

## 2020-01-29 DIAGNOSIS — I130 Hypertensive heart and chronic kidney disease with heart failure and stage 1 through stage 4 chronic kidney disease, or unspecified chronic kidney disease: Secondary | ICD-10-CM | POA: Diagnosis not present

## 2020-01-29 DIAGNOSIS — I255 Ischemic cardiomyopathy: Secondary | ICD-10-CM | POA: Diagnosis not present

## 2020-01-29 DIAGNOSIS — D631 Anemia in chronic kidney disease: Secondary | ICD-10-CM | POA: Diagnosis not present

## 2020-01-29 DIAGNOSIS — I5022 Chronic systolic (congestive) heart failure: Secondary | ICD-10-CM | POA: Diagnosis not present

## 2020-01-29 DIAGNOSIS — I2510 Atherosclerotic heart disease of native coronary artery without angina pectoris: Secondary | ICD-10-CM | POA: Diagnosis not present

## 2020-01-29 DIAGNOSIS — G4700 Insomnia, unspecified: Secondary | ICD-10-CM | POA: Diagnosis not present

## 2020-01-29 DIAGNOSIS — I4891 Unspecified atrial fibrillation: Secondary | ICD-10-CM | POA: Diagnosis not present

## 2020-01-29 MED ORDER — PHENYTOIN SODIUM EXTENDED 100 MG PO CAPS: 100.0000 mg | ORAL_CAPSULE | Freq: Two times a day (BID) | ORAL | 1 refills | Status: DC

## 2020-01-29 MED ORDER — DIVALPROEX SODIUM ER 500 MG PO TB24: ORAL_TABLET | ORAL | 1 refills | Status: DC

## 2020-01-29 MED ORDER — LEVETIRACETAM 1000 MG PO TABS: 1000.0000 mg | ORAL_TABLET | Freq: Two times a day (BID) | ORAL | 1 refills | Status: DC

## 2020-01-29 NOTE — Assessment & Plan Note (Signed)
Unclear if [facility] was able to shed any light on this as their note is not complete yet.

## 2020-02-02 DIAGNOSIS — I4891 Unspecified atrial fibrillation: Secondary | ICD-10-CM | POA: Diagnosis not present

## 2020-02-02 DIAGNOSIS — I255 Ischemic cardiomyopathy: Secondary | ICD-10-CM | POA: Diagnosis not present

## 2020-02-02 DIAGNOSIS — E785 Hyperlipidemia, unspecified: Secondary | ICD-10-CM | POA: Diagnosis not present

## 2020-02-02 DIAGNOSIS — Z8673 Personal history of transient ischemic attack (TIA), and cerebral infarction without residual deficits: Secondary | ICD-10-CM | POA: Diagnosis not present

## 2020-02-02 DIAGNOSIS — M1712 Unilateral primary osteoarthritis, left knee: Secondary | ICD-10-CM | POA: Diagnosis not present

## 2020-02-02 DIAGNOSIS — M109 Gout, unspecified: Secondary | ICD-10-CM | POA: Diagnosis not present

## 2020-02-02 DIAGNOSIS — D631 Anemia in chronic kidney disease: Secondary | ICD-10-CM | POA: Diagnosis not present

## 2020-02-02 DIAGNOSIS — I5022 Chronic systolic (congestive) heart failure: Secondary | ICD-10-CM | POA: Diagnosis not present

## 2020-02-02 DIAGNOSIS — Z9181 History of falling: Secondary | ICD-10-CM | POA: Diagnosis not present

## 2020-02-02 DIAGNOSIS — I2510 Atherosclerotic heart disease of native coronary artery without angina pectoris: Secondary | ICD-10-CM | POA: Diagnosis not present

## 2020-02-02 DIAGNOSIS — G4700 Insomnia, unspecified: Secondary | ICD-10-CM | POA: Diagnosis not present

## 2020-02-02 DIAGNOSIS — N183 Chronic kidney disease, stage 3 unspecified: Secondary | ICD-10-CM | POA: Diagnosis not present

## 2020-02-02 DIAGNOSIS — N2889 Other specified disorders of kidney and ureter: Secondary | ICD-10-CM | POA: Diagnosis not present

## 2020-02-02 DIAGNOSIS — I130 Hypertensive heart and chronic kidney disease with heart failure and stage 1 through stage 4 chronic kidney disease, or unspecified chronic kidney disease: Secondary | ICD-10-CM | POA: Diagnosis not present

## 2020-02-05 DIAGNOSIS — N183 Chronic kidney disease, stage 3 unspecified: Secondary | ICD-10-CM | POA: Diagnosis not present

## 2020-02-05 DIAGNOSIS — I4891 Unspecified atrial fibrillation: Secondary | ICD-10-CM | POA: Diagnosis not present

## 2020-02-05 DIAGNOSIS — Z9181 History of falling: Secondary | ICD-10-CM | POA: Diagnosis not present

## 2020-02-05 DIAGNOSIS — E785 Hyperlipidemia, unspecified: Secondary | ICD-10-CM | POA: Diagnosis not present

## 2020-02-05 DIAGNOSIS — Z8673 Personal history of transient ischemic attack (TIA), and cerebral infarction without residual deficits: Secondary | ICD-10-CM | POA: Diagnosis not present

## 2020-02-05 DIAGNOSIS — D631 Anemia in chronic kidney disease: Secondary | ICD-10-CM | POA: Diagnosis not present

## 2020-02-05 DIAGNOSIS — I255 Ischemic cardiomyopathy: Secondary | ICD-10-CM | POA: Diagnosis not present

## 2020-02-05 DIAGNOSIS — I2510 Atherosclerotic heart disease of native coronary artery without angina pectoris: Secondary | ICD-10-CM | POA: Diagnosis not present

## 2020-02-05 DIAGNOSIS — N2889 Other specified disorders of kidney and ureter: Secondary | ICD-10-CM | POA: Diagnosis not present

## 2020-02-05 DIAGNOSIS — I5022 Chronic systolic (congestive) heart failure: Secondary | ICD-10-CM | POA: Diagnosis not present

## 2020-02-05 DIAGNOSIS — M1712 Unilateral primary osteoarthritis, left knee: Secondary | ICD-10-CM | POA: Diagnosis not present

## 2020-02-05 DIAGNOSIS — I130 Hypertensive heart and chronic kidney disease with heart failure and stage 1 through stage 4 chronic kidney disease, or unspecified chronic kidney disease: Secondary | ICD-10-CM | POA: Diagnosis not present

## 2020-02-05 DIAGNOSIS — M109 Gout, unspecified: Secondary | ICD-10-CM | POA: Diagnosis not present

## 2020-02-05 DIAGNOSIS — G4700 Insomnia, unspecified: Secondary | ICD-10-CM | POA: Diagnosis not present

## 2020-02-09 ENCOUNTER — Other Ambulatory Visit: Payer: Self-pay | Admitting: Family

## 2020-02-09 DIAGNOSIS — I255 Ischemic cardiomyopathy: Secondary | ICD-10-CM | POA: Diagnosis not present

## 2020-02-09 DIAGNOSIS — Z9181 History of falling: Secondary | ICD-10-CM | POA: Diagnosis not present

## 2020-02-09 DIAGNOSIS — N2889 Other specified disorders of kidney and ureter: Secondary | ICD-10-CM | POA: Diagnosis not present

## 2020-02-09 DIAGNOSIS — I4891 Unspecified atrial fibrillation: Secondary | ICD-10-CM | POA: Diagnosis not present

## 2020-02-09 DIAGNOSIS — N183 Chronic kidney disease, stage 3 unspecified: Secondary | ICD-10-CM | POA: Diagnosis not present

## 2020-02-09 DIAGNOSIS — Z8673 Personal history of transient ischemic attack (TIA), and cerebral infarction without residual deficits: Secondary | ICD-10-CM | POA: Diagnosis not present

## 2020-02-09 DIAGNOSIS — E785 Hyperlipidemia, unspecified: Secondary | ICD-10-CM | POA: Diagnosis not present

## 2020-02-09 DIAGNOSIS — G4700 Insomnia, unspecified: Secondary | ICD-10-CM | POA: Diagnosis not present

## 2020-02-09 DIAGNOSIS — I130 Hypertensive heart and chronic kidney disease with heart failure and stage 1 through stage 4 chronic kidney disease, or unspecified chronic kidney disease: Secondary | ICD-10-CM | POA: Diagnosis not present

## 2020-02-09 DIAGNOSIS — I2510 Atherosclerotic heart disease of native coronary artery without angina pectoris: Secondary | ICD-10-CM | POA: Diagnosis not present

## 2020-02-09 DIAGNOSIS — D631 Anemia in chronic kidney disease: Secondary | ICD-10-CM | POA: Diagnosis not present

## 2020-02-09 DIAGNOSIS — I5022 Chronic systolic (congestive) heart failure: Secondary | ICD-10-CM | POA: Diagnosis not present

## 2020-02-09 DIAGNOSIS — M109 Gout, unspecified: Secondary | ICD-10-CM | POA: Diagnosis not present

## 2020-02-09 DIAGNOSIS — M1712 Unilateral primary osteoarthritis, left knee: Secondary | ICD-10-CM | POA: Diagnosis not present

## 2020-02-10 DIAGNOSIS — D682 Hereditary deficiency of other clotting factors: Secondary | ICD-10-CM

## 2020-02-10 DIAGNOSIS — N183 Chronic kidney disease, stage 3 unspecified: Secondary | ICD-10-CM

## 2020-02-10 DIAGNOSIS — G4700 Insomnia, unspecified: Secondary | ICD-10-CM

## 2020-02-10 DIAGNOSIS — M109 Gout, unspecified: Secondary | ICD-10-CM | POA: Diagnosis not present

## 2020-02-10 DIAGNOSIS — E785 Hyperlipidemia, unspecified: Secondary | ICD-10-CM | POA: Diagnosis not present

## 2020-02-10 DIAGNOSIS — I2510 Atherosclerotic heart disease of native coronary artery without angina pectoris: Secondary | ICD-10-CM

## 2020-02-10 DIAGNOSIS — D631 Anemia in chronic kidney disease: Secondary | ICD-10-CM

## 2020-02-10 DIAGNOSIS — I130 Hypertensive heart and chronic kidney disease with heart failure and stage 1 through stage 4 chronic kidney disease, or unspecified chronic kidney disease: Secondary | ICD-10-CM

## 2020-02-10 DIAGNOSIS — Z8673 Personal history of transient ischemic attack (TIA), and cerebral infarction without residual deficits: Secondary | ICD-10-CM

## 2020-02-10 DIAGNOSIS — I4891 Unspecified atrial fibrillation: Secondary | ICD-10-CM

## 2020-02-10 DIAGNOSIS — I5022 Chronic systolic (congestive) heart failure: Secondary | ICD-10-CM

## 2020-02-10 DIAGNOSIS — I255 Ischemic cardiomyopathy: Secondary | ICD-10-CM

## 2020-02-10 DIAGNOSIS — N2889 Other specified disorders of kidney and ureter: Secondary | ICD-10-CM | POA: Diagnosis not present

## 2020-02-10 DIAGNOSIS — G40209 Localization-related (focal) (partial) symptomatic epilepsy and epileptic syndromes with complex partial seizures, not intractable, without status epilepticus: Secondary | ICD-10-CM

## 2020-02-10 DIAGNOSIS — Z9181 History of falling: Secondary | ICD-10-CM

## 2020-02-10 DIAGNOSIS — M1712 Unilateral primary osteoarthritis, left knee: Secondary | ICD-10-CM | POA: Diagnosis not present

## 2020-02-17 ENCOUNTER — Other Ambulatory Visit: Payer: Self-pay | Admitting: Neurology

## 2020-02-17 DIAGNOSIS — G40209 Localization-related (focal) (partial) symptomatic epilepsy and epileptic syndromes with complex partial seizures, not intractable, without status epilepticus: Secondary | ICD-10-CM

## 2020-02-18 DIAGNOSIS — N201 Calculus of ureter: Secondary | ICD-10-CM | POA: Diagnosis not present

## 2020-02-18 DIAGNOSIS — R310 Gross hematuria: Secondary | ICD-10-CM | POA: Diagnosis not present

## 2020-02-23 DIAGNOSIS — N281 Cyst of kidney, acquired: Secondary | ICD-10-CM | POA: Diagnosis not present

## 2020-02-23 DIAGNOSIS — K869 Disease of pancreas, unspecified: Secondary | ICD-10-CM | POA: Diagnosis not present

## 2020-02-23 DIAGNOSIS — N201 Calculus of ureter: Secondary | ICD-10-CM | POA: Diagnosis not present

## 2020-02-23 DIAGNOSIS — R310 Gross hematuria: Secondary | ICD-10-CM | POA: Diagnosis not present

## 2020-02-24 ENCOUNTER — Other Ambulatory Visit: Payer: Self-pay | Admitting: Urology

## 2020-02-24 ENCOUNTER — Other Ambulatory Visit: Payer: Self-pay

## 2020-02-24 ENCOUNTER — Telehealth: Payer: Self-pay

## 2020-02-24 DIAGNOSIS — K869 Disease of pancreas, unspecified: Secondary | ICD-10-CM

## 2020-02-24 MED ORDER — CARVEDILOL 3.125 MG PO TABS: 3.1250 mg | ORAL_TABLET | Freq: Two times a day (BID) | ORAL | 2 refills | Status: AC

## 2020-02-24 NOTE — Telephone Encounter (Signed)
I am not going to refill this for him- according to medication list, it hasn't been refilled since August 2020 and had a d/c date of 12/2019. I would recommend he check with cardiologist since it looks like they have been managing for him.

## 2020-02-24 NOTE — Telephone Encounter (Signed)
F/u   The patient wife called back, aware of the NP's recommendation to reach out to the cardiologist.   The wife verbalized understanding will reach out to the Cardiologist.

## 2020-02-24 NOTE — Telephone Encounter (Signed)
1.Medication Requested:carvedilol (COREG) 3.125 MG tablet  2. Pharmacy (Name, Street, [place]): [facility] on [address], [place]   3. On Med List: Yes   4. Last Visit with PCP: 2.4.2021   5. Next visit date with PCP: pending    1. Patient wife saying patient is out of medication over a week.     Agent: Please be advised that RX refills may take up to 3 business days. We ask that you follow-up with your pharmacy.

## 2020-02-24 NOTE — Telephone Encounter (Signed)
Medication was rx by Cherylann Ratel A, DO pls advise if ok to refill. MD is out of the office.Marland KitchenJohny Chess

## 2020-03-08 ENCOUNTER — Other Ambulatory Visit: Payer: Self-pay | Admitting: Internal Medicine

## 2020-03-08 DIAGNOSIS — K869 Disease of pancreas, unspecified: Secondary | ICD-10-CM | POA: Diagnosis not present

## 2020-03-08 DIAGNOSIS — R310 Gross hematuria: Secondary | ICD-10-CM | POA: Diagnosis not present

## 2020-03-08 DIAGNOSIS — N201 Calculus of ureter: Secondary | ICD-10-CM | POA: Diagnosis not present

## 2020-03-22 ENCOUNTER — Inpatient Hospital Stay: Admission: RE | Admit: 2020-03-22 | Payer: Medicare Other | Source: Ambulatory Visit

## 2020-03-23 DIAGNOSIS — K869 Disease of pancreas, unspecified: Secondary | ICD-10-CM | POA: Diagnosis not present

## 2020-03-23 DIAGNOSIS — N201 Calculus of ureter: Secondary | ICD-10-CM | POA: Diagnosis not present

## 2020-03-25 ENCOUNTER — Other Ambulatory Visit: Payer: Self-pay | Admitting: *Deleted

## 2020-03-25 DIAGNOSIS — D500 Iron deficiency anemia secondary to blood loss (chronic): Secondary | ICD-10-CM

## 2020-03-25 DIAGNOSIS — D472 Monoclonal gammopathy: Secondary | ICD-10-CM

## 2020-03-28 ENCOUNTER — Inpatient Hospital Stay: Payer: Medicare Other

## 2020-03-28 ENCOUNTER — Inpatient Hospital Stay: Payer: Medicare Other | Attending: Hematology | Admitting: Hematology

## 2020-03-31 ENCOUNTER — Ambulatory Visit (HOSPITAL_COMMUNITY)
Admission: RE | Admit: 2020-03-31 | Discharge: 2020-03-31 | Disposition: A | Discharge status: Home / Self Care | Payer: Medicare Other | Source: Ambulatory Visit | Attending: Internal Medicine | Admitting: Internal Medicine

## 2020-03-31 ENCOUNTER — Other Ambulatory Visit: Payer: Self-pay

## 2020-03-31 ENCOUNTER — Encounter (HOSPITAL_COMMUNITY): Payer: Self-pay | Admitting: Internal Medicine

## 2020-03-31 VITALS — BP 100/60 | HR 107 | Wt 192.6 lb

## 2020-03-31 DIAGNOSIS — Z79899 Other long term (current) drug therapy: Secondary | ICD-10-CM | POA: Insufficient documentation

## 2020-03-31 DIAGNOSIS — I4819 Other persistent atrial fibrillation: Secondary | ICD-10-CM

## 2020-03-31 DIAGNOSIS — D682 Hereditary deficiency of other clotting factors: Secondary | ICD-10-CM | POA: Diagnosis not present

## 2020-03-31 DIAGNOSIS — Z87891 Personal history of nicotine dependence: Secondary | ICD-10-CM | POA: Insufficient documentation

## 2020-03-31 DIAGNOSIS — I5022 Chronic systolic (congestive) heart failure: Secondary | ICD-10-CM | POA: Insufficient documentation

## 2020-03-31 DIAGNOSIS — E785 Hyperlipidemia, unspecified: Secondary | ICD-10-CM | POA: Insufficient documentation

## 2020-03-31 DIAGNOSIS — N183 Chronic kidney disease, stage 3 unspecified: Secondary | ICD-10-CM | POA: Diagnosis not present

## 2020-03-31 DIAGNOSIS — Z8249 Family history of ischemic heart disease and other diseases of the circulatory system: Secondary | ICD-10-CM | POA: Insufficient documentation

## 2020-03-31 DIAGNOSIS — Z95810 Presence of automatic (implantable) cardiac defibrillator: Secondary | ICD-10-CM | POA: Diagnosis not present

## 2020-03-31 DIAGNOSIS — Z951 Presence of aortocoronary bypass graft: Secondary | ICD-10-CM | POA: Insufficient documentation

## 2020-03-31 DIAGNOSIS — F819 Developmental disorder of scholastic skills, unspecified: Secondary | ICD-10-CM | POA: Insufficient documentation

## 2020-03-31 DIAGNOSIS — Z86010 Personal history of colonic polyps: Secondary | ICD-10-CM | POA: Insufficient documentation

## 2020-03-31 DIAGNOSIS — Z8261 Family history of arthritis: Secondary | ICD-10-CM | POA: Diagnosis not present

## 2020-03-31 DIAGNOSIS — I130 Hypertensive heart and chronic kidney disease with heart failure and stage 1 through stage 4 chronic kidney disease, or unspecified chronic kidney disease: Secondary | ICD-10-CM | POA: Diagnosis not present

## 2020-03-31 DIAGNOSIS — G40909 Epilepsy, unspecified, not intractable, without status epilepticus: Secondary | ICD-10-CM | POA: Insufficient documentation

## 2020-03-31 DIAGNOSIS — Z87440 Personal history of urinary (tract) infections: Secondary | ICD-10-CM | POA: Diagnosis not present

## 2020-03-31 DIAGNOSIS — I428 Other cardiomyopathies: Secondary | ICD-10-CM | POA: Insufficient documentation

## 2020-03-31 DIAGNOSIS — Z8614 Personal history of Methicillin resistant Staphylococcus aureus infection: Secondary | ICD-10-CM | POA: Insufficient documentation

## 2020-03-31 DIAGNOSIS — I2510 Atherosclerotic heart disease of native coronary artery without angina pectoris: Secondary | ICD-10-CM | POA: Diagnosis not present

## 2020-03-31 DIAGNOSIS — I480 Paroxysmal atrial fibrillation: Secondary | ICD-10-CM | POA: Diagnosis not present

## 2020-03-31 DIAGNOSIS — Z8673 Personal history of transient ischemic attack (TIA), and cerebral infarction without residual deficits: Secondary | ICD-10-CM | POA: Insufficient documentation

## 2020-03-31 MED ORDER — AMIODARONE HCL 200 MG PO TABS: 200.0000 mg | ORAL_TABLET | Freq: Two times a day (BID) | ORAL | 3 refills | Status: AC

## 2020-03-31 NOTE — Progress Notes (Signed)
Advanced Heart Failure Clinic Note   Date:  03/31/2020   ID:  [facility] DOB 04-Sep-1946, MRN [id number]  Location: Home  Provider location: [place] [facility] Type of Visit: Established patient  PCP:  [name], MD  Cardiologist:  No primary care provider on file. Primary HF: [name]  Chief Complaint: Heart Failure follow-up   History of Present Illness:  Louis Ford. is a 74  y.o. male with hx of CAD s/p CABG, ICM s/p Biotronik ICD, chronic systolic CHF Echo 99991111 LVEF 30-35% -> improved to 55-60% (3/17),HTN, HLD, atrial fibrillation, previous stroke, Factor 7 deficiency, and seizure disorder.  Admitted from [facility] in 10/16 with ADHF in setting of AF with RVR. Developed cardiogenic shock and required milrinone.   Admitted 02/26/16 after two near syncopal episodes. ICD interrogation negative for any acute events. Suspect related to overdiuresis and hypotension with recurrent Afib.  Spontaneously converted to NSR without intervention. Echo EF 55-60%  Admitted 2/18 with LGIB after polypectomy found to have bleeding from the stalk and was clipped.   Admitted 12/28 => 12/28/17. Pt had UTI that exacerbated his PAF leading to Afib RVR. Underwent DCCV with transition to NSR. Amiodarone loaded.   Admitted 4/28-04/24/19 presented to the emergency room with few days of confusion and left hip pain after a fall. Came to the emergency room and he was found to have large retroperitoneal hematoma of the left psoas muscle measuring 10 x 18 cm. Edoxaban was stopped and has not been restarted.  In 11/20  developed a large hematoma late after his gen change out, requiring evacuation, followed by non-healing. The patient ultimately had to have his system removed.   TEE 11/20 EF 40-45%   Here fo f/u. Says he is doing ok. Continues to follow with Urology (Dr. [name]) due to kidney stones and hydronephrosis. Renal function has essentially  normalized. Denies CP or SOB. No edema or palpitations. Says he is having problems with his memory.   RHC 10/12/15 RA = 7 RV = 28/1/8 PA = 32/15 (20) PCW = 12 Fick cardiac output/index = 3.6/1.6 Thermo cardiac output/index = 3.5/1.5 PVR = 2.2 WU PA sat = 44%, 47% Ao sat = 98%  Echo 10/05/15 LVEF 30-35%, Mild MR, PA peak pressure 34 mmHg Echo 02/27/16 LVEF 55-60%, Grade 2 DD dysfunction. LAE mildly dilated, PA peak pressure 35 mm Hg.  Myoview 11/17 EF 39%  Labs: 12/30/15 K 5.0, creatinine 1.3 Labs: 02/29/16 K 4.7, creatinine 1.27 Labs: 03/25/2016: K 4.3 Creatinine 1.31 Labs: Creatinine 1.89  who presents via audio/video conferencing for a telehealth visit today.     [facility]. denies symptoms worrisome for COVID 19.   Past Medical History:  Diagnosis Date  . AICD (automatic cardioverter/defibrillator) present   . Atrial fibrillation   09/22/2012  . Blood loss anemia 04/18/2017   After GI bleed from colonoscopy and polypectomy  . CAD (coronary artery disease)   . Chronic systolic heart failure ([place])   . 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 GIB (gastrointestinal bleeding) 02/20/2017  . History of MRSA infection 05/2011  . HTN (hypertension)   . Hx of adenomatous colonic polyps 02/20/2017   01/2017 - 3 cm sigmoid TV adenoma and other smaller polyps - had post-polypectomy bleed Tx w/ clips Consider repeat colonoscopy 3 yrs [name], MD, [name]   . Hyperlipidemia   . implantable cardiac defibrillator-Biotronik  Device Implanted 2006; s/p gen change 03/2011 : bleeding persistent with pocket erosion and infection; explant and reimplant  06/2011  . Ischemic cardiomyopathy    EF 15 to 20% by TTE and TEE in 09/2012.  Severe LV dysfunction  . Obesity    BMI 31 in 09/2012  . Persistent atrial fibrillation with rapid ventricular response ([place]) 04/21/2014  . Retroperitoneal bleed 04/21/2019  . Seizure disorder ([place]) latest 09/30/2012  .  Seizures ([place])   . Stroke [facility])    Past Surgical History:  Procedure Laterality Date  . ALVEOLOPLASTY N/A 07/26/2019   Procedure: ALVEOLOPLASTY WITH RESUTURING OF ORAL WOUND;  Surgeon: [name], [name];  Location: [facility] ORS;  Service: Oral Surgery;  Laterality: N/A;  . CARDIAC CATHETERIZATION N/A 10/12/2015   Procedure: Right Heart Cath;  Surgeon: [name], MD;  Location: [place] CV LAB;  Service: Cardiovascular;  Laterality: N/A;  . CARDIOVERSION  09/24/2012   Procedure: CARDIOVERSION;  Surgeon: [name], MD;  Location: [facility] ENDOSCOPY;  Service: Cardiovascular;  Laterality: N/A;  . CARDIOVERSION N/A 12/25/2017   Procedure: CARDIOVERSION;  Surgeon: [name], MD;  Location: [facility];  Service: Cardiovascular;  Laterality: N/A;  . COLONOSCOPY W/ POLYPECTOMY  02/13/2017  . CORONARY ARTERY BYPASS GRAFT  2011   in [place] N/A 08/12/2019   Procedure: CAUTERIZATION OF ORAL BLEEDING;  Surgeon: [name], [name];  Location: [facility];  Service: Oral Surgery;  Laterality: N/A;  . FLEXIBLE SIGMOIDOSCOPY N/A 02/20/2017   Procedure: FLEXIBLE SIGMOIDOSCOPY;  Surgeon: [name], MD;  Location: [facility] ENDOSCOPY;  Service: Endoscopy;  Laterality: N/A;  . FLEXIBLE SIGMOIDOSCOPY N/A 02/21/2017   Procedure: FLEXIBLE SIGMOIDOSCOPY;  Surgeon: [name], MD;  Location: [facility] ENDOSCOPY;  Service: Endoscopy;  Laterality: N/A;  . ICD    . ICD GENERATOR CHANGEOUT N/A 09/10/2019   Procedure: [place];  Surgeon: [name], MD;  Location: [place] CV LAB;  Service: Cardiovascular;  Laterality: N/A;  . ICD LEAD REMOVAL Right 11/13/2019   Procedure: ICD EXTRACTION and Lead Extraction;  Surgeon: [name], MD;  Location: [place];  Service: Cardiovascular;  Laterality: Right;  Dr. Servando Snare for backup  . POCKET REVISION/RELOCATION N/A 09/17/2019   Procedure: POCKET REVISION/RELOCATION;  Surgeon: Thompson Grayer, MD;  Location: [place] CV LAB;  Service: Cardiovascular;   Laterality: N/A;  . SUBMANDIBULAR GLAND EXCISION N/A 08/08/2019   Procedure: suture of oral wounds;  Surgeon: [name], [name];  Location: [facility];  Service: Oral Surgery;  Laterality: N/A;  . TEE WITHOUT CARDIOVERSION  09/24/2012   Procedure: TRANSESOPHAGEAL ECHOCARDIOGRAM (TEE);  Surgeon: [name], MD;  Location: [place];  Service: Cardiovascular;  Laterality: N/A;  dave/anesth, dl, cindy/echo   . TEE WITHOUT CARDIOVERSION N/A 11/12/2019   Procedure: TRANSESOPHAGEAL ECHOCARDIOGRAM (TEE);  Surgeon: [name], MD;  Location: [facility] ENDOSCOPY;  Service: Cardiovascular;  Laterality: N/A;  . TEE WITHOUT CARDIOVERSION N/A 11/13/2019   Procedure: TRANSESOPHAGEAL ECHOCARDIOGRAM (TEE);  Surgeon: [name], MD;  Location: [facility] OR;  Service: Cardiovascular;  Laterality: N/A;     Current Outpatient Medications  Medication Sig Dispense Refill  . acetaminophen (TYLENOL) 325 MG tablet Take 650 mg by mouth every 6 (six) hours as needed for mild pain.    [name] amiodarone (PACERONE) 200 MG tablet Take 200 mg by mouth daily.    . carvedilol (COREG) 3.125 MG tablet Take 1 tablet (3.125 mg total) by mouth 2 (two) times daily with a  meal. 180 tablet 2  . divalproex (DEPAKOTE ER) 500 MG 24 hr tablet TAKE 1 TABLET BY MOUTH ONCE DAILY AT NIGHT 30 tablet 0  . DULoxetine (CYMBALTA) 30 MG capsule Take 1 capsule by mouth once daily 90 capsule 1  . furosemide (LASIX) 40 MG tablet Take 1 tablet by mouth once daily 90 tablet 3  . levETIRAcetam (KEPPRA) 1000 MG tablet Take 1 tablet by mouth twice daily 60 tablet 0  . phenytoin (DILANTIN) 100 MG ER capsule Take 1 capsule by mouth twice daily 60 capsule 0  . simvastatin (ZOCOR) 20 MG tablet TAKE 1 TABLET BY MOUTH ONCE DAILY AT 6PM 90 tablet 1  . tamsulosin (FLOMAX) 0.4 MG CAPS capsule Take 1 capsule by mouth once daily 90 capsule 1  . traMADol (ULTRAM) 50 MG tablet Take 1 tablet (50 mg total) by mouth every 6 (six) hours as needed for moderate pain. for  pain 20 tablet 0   No current facility-administered medications for this encounter.    Allergies:   Patient has no known allergies.   Social History:  The patient  reports that he quit smoking about 11 years ago. His smoking use included pipe. He quit smokeless tobacco use about 11 years ago. He reports that he does not drink alcohol or use drugs.   Family History:  The patient's family history includes Arthritis in his mother; Heart attack in his mother; Heart disease in his mother; Other in his father.   ROS:  Please see the history of present illness.   All other systems are personally reviewed and negative.   Vitals:   03/31/20 1218  BP: 100/60  Pulse: (!) 107  SpO2: 100%  Weight: 87.4 kg (192 lb 9.6 oz)    Exam:   General:  Sitting in WC. No resp difficulty HEENT: normal Neck: supple. no JVD. Carotids 2+ bilat; no bruits. No lymphadenopathy or thryomegaly appreciated. Cor: PMI nondisplaced. Irregualr tachy  No rubs, gallops or murmurs. Lungs: clear Abdomen: soft, nontender, nondistended. No hepatosplenomegaly. No bruits or masses. Good bowel sounds. Extremities: no cyanosis, clubbing, rash, edema Neuro: alert & orientedx3, cranial nerves grossly intact. moves all 4 extremities w/o difficulty. Affect pleasant  Recent Labs: 07/21/2019: B Natriuretic Peptide 40.2 11/13/2019: Magnesium 1.8 01/13/2020: TSH 7.240 01/28/2020: ALT 25; BUN 25; Creatinine, Ser 1.33; Hemoglobin 10.0; Platelets 297.0; Potassium 3.9; Sodium 133  Personally reviewed   Wt Readings from Last 3 Encounters:  03/31/20 87.4 kg (192 lb 9.6 oz)  01/28/20 85.8 kg (189 lb 4 oz)  01/17/20 85.6 kg (188 lb 11.4 oz)    ECG; Atrial tach vs atypical flutter 108 Personally reviewed  ASSESSMENT AND PLAN:  1. Chronic Systolic CHF - due to NICM - Echo 02/27/16 Echo improved to 55-60% from echo 10/05/15 LVEF 30-35% - s/p Biotronik ICD - now extracted due to bleeding and non-healing wound - Myoview 11/17 EF 39% - Echo  03/2017 LVEF 50-55%.  - TEE 11/20 EF 40-45% - NYHA II-III stable - Volume status stable. Continue Continue lasix 40 mg daily.  - Continue carvedilol 3.125 mg BID.  - Off spiro and losartan and hydral/Imdur due to AKI and low BP. BP remains too low to restart.  - Repeat echo    2. PAF  - Back in Atrial tach vs atypical flutter at 108  - Has not tolerated AF well in past - Increase amio to 200 BID - edoxaban stopped due to large RP bleed in 4/20. Would not restart at this time \ -  refer to AF Clinic to help follow  3. CAD s/p CABG - Myoview 10/2016 with no significant ischemia - No s/s ischemia - Continue medical therapy.   4. HTN - Blood pressure now low.   5. CKD stage III - creatinine stable on recent labs  6. Learning disability  - Previously paramedicine patient but graduated.    Signed, Glori Bickers, MD  03/31/2020 12:36 PM  Advanced [facility] [place] [address] [facility] and [place] [address] [phone] (office) [phone] (fax)

## 2020-03-31 NOTE — Patient Instructions (Signed)
Increase Amiodarone to 200 mg Twice daily   Your physician has requested that you have an echocardiogram. Echocardiography is a painless test that uses sound waves to create images of your heart. It provides your doctor with information about the size and shape of your heart and how well your heart's chambers and valves are working. This procedure takes approximately one hour. There are no restrictions for this procedure.  You have been referred to A-fib clinic  Your physician recommends that you schedule a follow-up appointment in: 4 months  Labs done today, your results will be available in MyChart, we will contact you for abnormal readings.  If you have any questions or concerns before your next appointment please send [place] a message through [place] or call our office at [phone].  At the [facility], you and your health needs are our priority. As part of our continuing mission to provide you with exceptional heart care, we have created designated Provider Care Teams. These Care Teams include your primary Cardiologist (physician) and Advanced Practice Providers (APPs- Physician Assistants and Nurse Practitioners) who all work together to provide you with the care you need, when you need it.   You may see any of the following providers on your designated Care Team at your next follow up: [name] Dr Glori Bickers . Dr Loralie Champagne . Darrick Grinder, NP . Lyda Jester, PA . Audry Riles, PharmD   Please be sure to bring in all your medications bottles to every appointment.

## 2020-04-12 ENCOUNTER — Ambulatory Visit (HOSPITAL_BASED_OUTPATIENT_CLINIC_OR_DEPARTMENT_OTHER)
Admission: RE | Admit: 2020-04-12 | Discharge: 2020-04-12 | Disposition: A | Discharge status: Home / Self Care | Payer: Medicare Other | Source: Ambulatory Visit

## 2020-04-12 ENCOUNTER — Other Ambulatory Visit: Payer: Self-pay

## 2020-04-12 ENCOUNTER — Encounter (HOSPITAL_COMMUNITY): Payer: Self-pay | Admitting: Physician Assistant

## 2020-04-12 ENCOUNTER — Ambulatory Visit (HOSPITAL_COMMUNITY)
Admission: RE | Admit: 2020-04-12 | Discharge: 2020-04-12 | Disposition: A | Discharge status: Home / Self Care | Payer: Medicare Other | Source: Ambulatory Visit | Attending: Physician Assistant | Admitting: Physician Assistant

## 2020-04-12 VITALS — BP 112/70 | HR 89 | Ht 71.0 in | Wt 191.2 lb

## 2020-04-12 DIAGNOSIS — Z87891 Personal history of nicotine dependence: Secondary | ICD-10-CM | POA: Diagnosis not present

## 2020-04-12 DIAGNOSIS — I5022 Chronic systolic (congestive) heart failure: Secondary | ICD-10-CM | POA: Insufficient documentation

## 2020-04-12 DIAGNOSIS — I4819 Other persistent atrial fibrillation: Secondary | ICD-10-CM

## 2020-04-12 DIAGNOSIS — I110 Hypertensive heart disease with heart failure: Secondary | ICD-10-CM | POA: Diagnosis not present

## 2020-04-12 DIAGNOSIS — D6869 Other thrombophilia: Secondary | ICD-10-CM

## 2020-04-12 DIAGNOSIS — G40909 Epilepsy, unspecified, not intractable, without status epilepticus: Secondary | ICD-10-CM | POA: Insufficient documentation

## 2020-04-12 DIAGNOSIS — I4892 Unspecified atrial flutter: Secondary | ICD-10-CM | POA: Diagnosis not present

## 2020-04-12 DIAGNOSIS — Z79899 Other long term (current) drug therapy: Secondary | ICD-10-CM | POA: Diagnosis not present

## 2020-04-12 DIAGNOSIS — I428 Other cardiomyopathies: Secondary | ICD-10-CM | POA: Diagnosis not present

## 2020-04-12 DIAGNOSIS — Z8673 Personal history of transient ischemic attack (TIA), and cerebral infarction without residual deficits: Secondary | ICD-10-CM | POA: Diagnosis not present

## 2020-04-12 DIAGNOSIS — D682 Hereditary deficiency of other clotting factors: Secondary | ICD-10-CM | POA: Diagnosis not present

## 2020-04-12 DIAGNOSIS — I2510 Atherosclerotic heart disease of native coronary artery without angina pectoris: Secondary | ICD-10-CM | POA: Insufficient documentation

## 2020-04-12 DIAGNOSIS — Z951 Presence of aortocoronary bypass graft: Secondary | ICD-10-CM | POA: Insufficient documentation

## 2020-04-12 DIAGNOSIS — E785 Hyperlipidemia, unspecified: Secondary | ICD-10-CM | POA: Diagnosis not present

## 2020-04-12 DIAGNOSIS — Z7901 Long term (current) use of anticoagulants: Secondary | ICD-10-CM | POA: Diagnosis not present

## 2020-04-12 DIAGNOSIS — I447 Left bundle-branch block, unspecified: Secondary | ICD-10-CM | POA: Diagnosis not present

## 2020-04-12 NOTE — Patient Instructions (Signed)
Follow up with Dr. Lovena Le in 6-8 weeks - their office will call you to set up.

## 2020-04-12 NOTE — Progress Notes (Signed)
  Echocardiogram 2D Echocardiogram has been performed.  Louis Ford 04/12/2020, 10:38 AM

## 2020-04-12 NOTE — Progress Notes (Addendum)
Primary Care Physician: [name], MD Primary Cardiologist: Dr Haroldine Laws  Primary Electrophysiologist: Dr Lovena Le Referring Physician: Dr Alferd Patee. is a 74 y.o. male with a history of CAD s/p CABG, ICM s/p Biotronik ICD (extracted 2/2 hematoma and non healing wound), chronic systolic CHF Echo [id number] LVEF 30-35% -> improved to 55-60% (3/17), HTN, HLD, atrial fibrillation, previous stroke, Factor 7 deficiency, seizure disorder, previous retroperitoneal bleed who presents for consultation in the [facility]. Patient was previously on edoxaban for a CHADS2VASC score of 6 but this was stopped after his retroperitoneal bleed. He has been maintained on amiodarone but was found to be in atrial tach vs flutter at routine follow up with Dr Haroldine Laws on 03/31/20. His amiodarone was increased at that time. Patient denies any awareness of his arrhythmia. His weight is stable, no increased SOB or dizziness.   Today, he denies symptoms of palpitations, chest pain, shortness of breath, orthopnea, PND, lower extremity edema, dizziness, presyncope, syncope, snoring, daytime somnolence, bleeding, or neurologic sequela. The patient is tolerating medications without difficulties and is otherwise without complaint today.    Atrial Fibrillation Risk Factors:  he does not have symptoms or diagnosis of sleep apnea. he does not have a history of rheumatic fever.   he has a BMI of Body mass index is 26.67 kg/m.[name] Filed Weights   04/12/20 1102  Weight: 86.7 kg    Family History  Problem Relation Age of Onset  . Arthritis Mother   . Heart disease Mother   . Heart attack Mother   . Other Father        smoker  . Hypertension Neg Hx        unknown  . Stroke Neg Hx        unknown     Atrial Fibrillation Management history:  Previous antiarrhythmic drugs: amiodarone Previous cardioversions: 2013, 12/2017 Previous ablations: none CHADS2VASC score:  6 Anticoagulation history: edoxaban   Past Medical History:  Diagnosis Date  . AICD (automatic cardioverter/defibrillator) present   . Atrial fibrillation   09/22/2012  . Blood loss anemia 04/18/2017   After GI bleed from colonoscopy and polypectomy  . CAD (coronary artery disease)   . Chronic systolic heart failure ([place])   . 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 GIB (gastrointestinal bleeding) 02/20/2017  . History of MRSA infection 05/2011  . HTN (hypertension)   . Hx of adenomatous colonic polyps 02/20/2017   01/2017 - 3 cm sigmoid TV adenoma and other smaller polyps - had post-polypectomy bleed Tx w/ clips Consider repeat colonoscopy 3 yrs [name], MD, [name]   . Hyperlipidemia   . implantable cardiac defibrillator-Biotronik    Device Implanted 2006; s/p gen change 03/2011 : bleeding persistent with pocket erosion and infection; explant and reimplant  06/2011  . Ischemic cardiomyopathy    EF 15 to 20% by TTE and TEE in 09/2012.  Severe LV dysfunction  . Obesity    BMI 31 in 09/2012  . Persistent atrial fibrillation with rapid ventricular response ([place]) 04/21/2014  . Retroperitoneal bleed 04/21/2019  . Seizure disorder ([place]) latest 09/30/2012  . Seizures ([place])   . Stroke [facility])    Past Surgical History:  Procedure Laterality Date  . ALVEOLOPLASTY N/A 07/26/2019   Procedure: ALVEOLOPLASTY WITH RESUTURING OF ORAL WOUND;  Surgeon: [name], [name];  Location: [facility] ORS;  Service: Oral Surgery;  Laterality: N/A;  . CARDIAC CATHETERIZATION  N/A 10/12/2015   Procedure: Right Heart Cath;  Surgeon: [name], MD;  Location: [place] CV LAB;  Service: Cardiovascular;  Laterality: N/A;  . CARDIOVERSION  09/24/2012   Procedure: CARDIOVERSION;  Surgeon: [name], MD;  Location: [facility] ENDOSCOPY;  Service: Cardiovascular;  Laterality: N/A;  . CARDIOVERSION N/A 12/25/2017   Procedure: CARDIOVERSION;  Surgeon: [name], MD;   Location: [facility];  Service: Cardiovascular;  Laterality: N/A;  . COLONOSCOPY W/ POLYPECTOMY  02/13/2017  . CORONARY ARTERY BYPASS GRAFT  2011   in [place] N/A 08/12/2019   Procedure: CAUTERIZATION OF ORAL BLEEDING;  Surgeon: [name], [name];  [facility]: [facility];  Service: Oral Surgery;  Laterality: N/A;  . FLEXIBLE SIGMOIDOSCOPY N/A 02/20/2017   Procedure: FLEXIBLE SIGMOIDOSCOPY;  Surgeon: [name], MD;  Location: [facility] ENDOSCOPY;  Service: Endoscopy;  Laterality: N/A;  . FLEXIBLE SIGMOIDOSCOPY N/A 02/21/2017   Procedure: FLEXIBLE SIGMOIDOSCOPY;  Surgeon: [name], MD;  Location: [facility] ENDOSCOPY;  Service: Endoscopy;  Laterality: N/A;  . ICD    . ICD GENERATOR CHANGEOUT N/A 09/10/2019   Procedure: [place];  Surgeon: [name], MD;  Location: [place] CV LAB;  Service: Cardiovascular;  Laterality: N/A;  . ICD LEAD REMOVAL Right 11/13/2019   Procedure: ICD EXTRACTION and Lead Extraction;  Surgeon: [name], MD;  Location: [place];  Service: Cardiovascular;  Laterality: Right;  Dr. Servando Snare for backup  . POCKET REVISION/RELOCATION N/A 09/17/2019   Procedure: POCKET REVISION/RELOCATION;  Surgeon: Thompson Grayer, MD;  Location: [place] CV LAB;  Service: Cardiovascular;  Laterality: N/A;  . SUBMANDIBULAR GLAND EXCISION N/A 08/08/2019   Procedure: suture of oral wounds;  Surgeon: [name], [name];  [facility];  Service: Oral Surgery;  Laterality: N/A;  . TEE WITHOUT CARDIOVERSION  09/24/2012   Procedure: TRANSESOPHAGEAL ECHOCARDIOGRAM (TEE);  Surgeon: [name], MD;  Location: [place];  Service: Cardiovascular;  Laterality: N/A;  dave/anesth, dl, cindy/echo   . TEE WITHOUT CARDIOVERSION N/A 11/12/2019   Procedure: TRANSESOPHAGEAL ECHOCARDIOGRAM (TEE);  Surgeon: [name], MD;  Location: [facility] ENDOSCOPY;  Service: Cardiovascular;  Laterality: N/A;  . TEE WITHOUT CARDIOVERSION N/A 11/13/2019   Procedure: TRANSESOPHAGEAL ECHOCARDIOGRAM  (TEE);  Surgeon: [name], MD;  Location: [facility] OR;  Service: Cardiovascular;  Laterality: N/A;    Current Outpatient Medications  Medication Sig Dispense Refill  . acetaminophen (TYLENOL) 325 MG tablet Take 650 mg by mouth every 6 (six) hours as needed for mild pain.    [name] amiodarone (PACERONE) 200 MG tablet Take 1 tablet (200 mg total) by mouth 2 (two) times daily. 60 tablet 3  . carvedilol (COREG) 3.125 MG tablet Take 1 tablet (3.125 mg total) by mouth 2 (two) times daily with a meal. 180 tablet 2  . divalproex (DEPAKOTE ER) 500 MG 24 hr tablet TAKE 1 TABLET BY MOUTH ONCE DAILY AT NIGHT 30 tablet 0  . DULoxetine (CYMBALTA) 30 MG capsule Take 1 capsule by mouth once daily 90 capsule 1  . furosemide (LASIX) 40 MG tablet Take 1 tablet by mouth once daily 90 tablet 3  . levETIRAcetam (KEPPRA) 1000 MG tablet Take 1 tablet by mouth twice daily 60 tablet 0  . phenytoin (DILANTIN) 100 MG ER capsule Take 1 capsule by mouth twice daily 60 capsule 0  . simvastatin (ZOCOR) 20 MG tablet TAKE 1 TABLET BY MOUTH ONCE DAILY AT 6PM 90 tablet 1  . tamsulosin (FLOMAX) 0.4 MG CAPS  capsule Take 1 capsule by mouth once daily 90 capsule 1  . traMADol (ULTRAM) 50 MG tablet Take 1 tablet (50 mg total) by mouth every 6 (six) hours as needed for moderate pain. for pain 20 tablet 0   No current facility-administered medications for this encounter.    No Known Allergies  Social History   Socioeconomic History  . Marital status: Married    Spouse name: Not on file  . Number of children: Not on file  . Years of education: Not on file  . Highest education level: Not on file  Occupational History  . Not on file  Tobacco Use  . Smoking status: Former Smoker    Types: Pipe    Quit date: 09/21/2008    Years since quitting: 11.5  . Smokeless tobacco: Former Systems developer    Quit date: 09/21/2008  Substance and Sexual Activity  . Alcohol use: No  . Drug use: No  . Sexual activity: Not Currently    Birth  control/protection: None  Other Topics Concern  . Not on file  Social History Narrative   ** Merged History Encounter **       Social Determinants of Health   Financial Resource Strain:   . Difficulty of Paying Living Expenses:   Food Insecurity:   . Worried About Charity fundraiser in the Last Year:   . Arboriculturist in the Last Year:   Transportation Needs:   . Film/video editor (Medical):   [name] Lack of Transportation (Non-Medical):   Physical Activity:   . Days of Exercise per Week:   . Minutes of Exercise per Session:   Stress:   . Feeling of Stress :   Social Connections:   . Frequency of Communication with Friends and Family:   . Frequency of Social Gatherings with Friends and Family:   . Attends Religious Services:   . Active Member of Clubs or Organizations:   . Attends Archivist Meetings:   [name] Marital Status:   Intimate Partner Violence:   . Fear of Current or Ex-Partner:   . Emotionally Abused:   [name] Physically Abused:   . Sexually Abused:      ROS- All systems are reviewed and negative except as per the HPI above.  Physical Exam: Vitals:   04/12/20 1102  BP: 112/70  Pulse: 89  Weight: 86.7 kg  Height: 5\' 11"  (1.803 m)    GEN- The patient is well appearing, alert and oriented x 3 today.   Head- normocephalic, atraumatic Eyes-  Sclera clear, conjunctiva pink Ears- hearing intact Oropharynx- clear Neck- supple  Lungs- Clear to ausculation bilaterally, normal work of breathing Heart- irregular rate and rhythm, no murmurs, rubs or gallops  GI- soft, NT, ND, + BS Extremities- no clubbing, cyanosis, or edema MS- no significant deformity or atrophy Skin- no rash or lesion Psych- euthymic mood, full affect Neuro- strength and sensation are intact  Wt Readings from Last 3 Encounters:  04/12/20 86.7 kg  03/31/20 87.4 kg  01/28/20 85.8 kg    EKG today demonstrates afib HR 89, LBBB, QRS 124, QTc 506  Echo 04/09/17 demonstrated  - Left  ventricle: The cavity size was normal. There was mild  concentric hypertrophy. Systolic function was normal. The  estimated ejection fraction was in the range of 50% to 55%. Mild  hypokinesis of the inferolateral myocardium.  - Ventricular septum: Septal motion showed abnormal function and  dyssynchrony.  - Aortic valve: Transvalvular velocity was  within the normal range.  There was no stenosis. There was no regurgitation.  - Mitral valve: Transvalvular velocity was within the normal range.  There was no evidence for stenosis. There was mild regurgitation  directed posteriorly.  - Left atrium: The atrium was severely dilated.  - Right ventricle: The cavity size was normal. Wall thickness was  normal. Systolic function was normal.  - Right atrium: The atrium was mildly dilated.  - Tricuspid valve: There was mild regurgitation.  - Pulmonary arteries: Systolic pressure was within the normal  range. PA peak pressure: 32 mm Hg (S).   Epic records are reviewed at length today  CHA2DS2-VASc Score = 6 The patient's score is based upon: CHF History: Yes HTN History: Yes Age : [age] Diabetes History: No Stroke History: Yes Vascular Disease History: Yes Gender: Male      ASSESSMENT AND PLAN: 1. Persistent Atrial Fibrillation/atyipcal atrial flutter The patient's CHA2DS2-VASc score is 6, indicating a 9.7% annual risk of stroke.   Patient remains in rate controlled afib today. We discussed therapeutic options today. Given his inability to be on anticoagulation, paucity of symptoms, and severely dilated LA, would not pursue rhythm control at this time. Discussed with Dr Lovena Le.  Continue amiodarone 200 mg BID for rate control. He has a h/o hypotension which has limited rate control and HF therapies.  Continue Coreg 3.125 mg BID  2. Secondary Hypercoagulable State (ICD10:  D68.69) The patient is at significant risk for stroke/thromboembolism based upon his CHA2DS2-VASc  Score of 6.  However, the patient is not on anticoagulation due to his high bleeding risk.     3. HTN Stable, no changes today.  4. CAD S/p CABG No anginal symptoms.   5. Chronic systolic CHF No signs or symptoms of fluid overload today. Repeat echo pending per Dr Haroldine Laws.    Follow up with Dr Lovena Le in 6-8 weeks. Dr Haroldine Laws as scheduled.    [facility] [address] [place], [place] [address] [phone] 04/12/2020 11:34 AM

## 2020-04-26 ENCOUNTER — Other Ambulatory Visit: Payer: Self-pay

## 2020-04-26 ENCOUNTER — Ambulatory Visit
Admission: RE | Admit: 2020-04-26 | Discharge: 2020-04-26 | Disposition: A | Discharge status: Home / Self Care | Payer: Medicare Other | Source: Ambulatory Visit | Attending: Urology | Admitting: Urology

## 2020-04-26 DIAGNOSIS — K8689 Other specified diseases of pancreas: Secondary | ICD-10-CM | POA: Diagnosis not present

## 2020-04-26 DIAGNOSIS — K869 Disease of pancreas, unspecified: Secondary | ICD-10-CM

## 2020-04-26 MED ORDER — GADOBENATE DIMEGLUMINE 529 MG/ML IV SOLN
15.0000 mL | Freq: Once | INTRAVENOUS | Status: AC | PRN
  Administered 2020-04-26: 15 mL via INTRAVENOUS

## 2020-05-04 ENCOUNTER — Encounter: Payer: Self-pay | Admitting: Neurology

## 2020-05-04 ENCOUNTER — Other Ambulatory Visit: Payer: Self-pay

## 2020-05-04 ENCOUNTER — Ambulatory Visit: Payer: Medicare Other | Admitting: Neurology

## 2020-05-04 DIAGNOSIS — G40209 Localization-related (focal) (partial) symptomatic epilepsy and epileptic syndromes with complex partial seizures, not intractable, without status epilepticus: Secondary | ICD-10-CM | POA: Diagnosis not present

## 2020-05-04 MED ORDER — LEVETIRACETAM 1000 MG PO TABS: 1000.0000 mg | ORAL_TABLET | Freq: Two times a day (BID) | ORAL | 3 refills | Status: AC

## 2020-05-04 MED ORDER — DIVALPROEX SODIUM ER 500 MG PO TB24: ORAL_TABLET | ORAL | 3 refills | Status: AC

## 2020-05-04 MED ORDER — PHENYTOIN SODIUM EXTENDED 100 MG PO CAPS: 100.0000 mg | ORAL_CAPSULE | Freq: Two times a day (BID) | ORAL | 3 refills | Status: AC

## 2020-05-04 NOTE — Progress Notes (Signed)
NEUROLOGY FOLLOW UP OFFICE NOTE  Louis Ford. [id number] July 05, 1946  HISTORY OF PRESENT ILLNESS: I had the pleasure of seeing [facility] in follow-up in the neurology clinic on 05/04/2020.  The patient was last seen 4 months ago for seizures. He is alone in the office today. Since his last visit, he denies any seizures since December 2020. He is amnestic of his seizures and had previously denied seizures on last visit but his wife reported episodes of humming/unresponsivenss. He reports asking his wife prior to the visit and that she had denied seizures. On his last visit, Depakote ER 500mg  qhs was added on to Levetiracetam 1000mg  BID and Dilantin 100mg  BID. He denies any side effects, but is noted to have mild bilateral hand tremors today. He denies any significant dizziness, focal numbness/tingling/weakness, no falls. His wife manages his medications. He has sleep difficulties stating"I am a night hawk." He lives with his wife and son. He has occasional headaches and takes ES Tylenol with good effect.   History on Initial Assessment 10/07/2017: This is a 74 yo RH man with a history of hypertension, hyperlipidemia, cardiomyopathy s/p AICD, atrial fibrillation on anticoagulation with [place], factor VII deficiency, prior stroke with no residual deficits, and seizures. He moved to [place] 3 years ago and comes today with his wife. He reports seizures started in his early 25s while he was living in [place]. He also reports being told he had a stroke but denied any deficits from it. He has had several hospital/ER visits to [facility] since 2013, records were reviewed. He denies any prior warning to the seizures. He has been married to his wife for a year, she has only seen 2 seizures, when he was admitted last September 2018, and one a week ago at 1am. She describes humming, unresponsiveness, body stiff but no convulsive activity. Records from prior ER visits from 2013 to 2015 indicate staring spells and  generalized tonic-clonic seizures. The last ER visit for seizure prior to September 2018 was in October 2016. He was previously on Keppra and Dilantin at some point in [place], and when he moved here and had the seizures initially, he was not taking seizure medication. Dilantin appears to have been prescribed from the ER at some point. He has been taking Dilantin 100mg  BID and denied missing medication when he was admitted for breakthrough seizures last 09/08/17. His wife reports he had 2 seizures where he was humming and unresponsive for a few minutes. In the ER, he had 2 witnessed complex partial seizures with note of diaphoresis, humming, followed by staring off, lip smacking, and picking objects. This lasted 5 minutes, he received 2 doses of Ativan. His awake and drowsy EEG showed diffuse beta activity, no epileptiform discharges. His head CT did not show any acute changes, there was chronic stable microvascular disease. He had a head CT in October 2013 with note of encephalomalacia in the medial left temporal lobe, this is slightly visible on last brain imaging. His Dilantin level was 10, he was discharged home on higher dose Dilantin 300mg  qhs and Keppra 750mg  BID. He denies any side effects to the medications. His wife administers medications. She reports one more seizures since hospitalization that occurred a week ago at 1am while in bed, he became diaphoretic, said it was hot and turned the fan on, then started humming for a few minutes.   He denies any dizziness, diplopia, dysarthria/dysphagia, neck/back pain, bowel/bladder dysfunction. He has a little shakiness in both hands.  He denies any gaps in time, olfactory/gustatory hallucinations, deja vu, rising epigastric sensation, focal numbness/tingling/weakness, myoclonic jerks. He reports waking up today feeling a little left-sided chest pain and warm feeling in his left arm. He reports a sensation in his head like there is water going from side to side,  he has occasional left-sided head pain. He does not sleep well, he is up at night watching TV, goes to bed from 4am to 10am. He denies any alcohol use. He feels his memory is good. His wife has taken over medications since his hospital stay, he would take his medication in the pillbox and want to fill it back up and take again, or sometimes he would not take all the medications in the box.   Epilepsy Risk Factors:  History of stroke. There is medial left temporal encephalomalacia on head CT. Otherwise he had a normal birth and early development.  There is no history of febrile convulsions, CNS infections such as meningitis/encephalitis, significant traumatic brain injury, neurosurgical procedures, or family history of seizures.  PAST MEDICAL HISTORY: Past Medical History:  Diagnosis Date  . AICD (automatic cardioverter/defibrillator) present   . Atrial fibrillation   09/22/2012  . Blood loss anemia 04/18/2017   After GI bleed from colonoscopy and polypectomy  . CAD (coronary artery disease)   . Chronic systolic heart failure ([place])   . 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 Factor VII deficiency ([employer]) 10/07/2012  . GIB (gastrointestinal bleeding) 02/20/2017  . History of MRSA infection 05/2011  . HTN (hypertension)   . Hx of adenomatous colonic polyps 02/20/2017   01/2017 - 3 cm sigmoid TV adenoma and other smaller polyps - had post-polypectomy bleed Tx w/ clips Consider repeat colonoscopy 3 yrs [name], MD, [name]   . Hyperlipidemia   . implantable cardiac defibrillator-Biotronik    Device Implanted 2006; s/p gen change 03/2011 : bleeding persistent with pocket erosion and infection; explant and reimplant  06/2011  . Ischemic cardiomyopathy    EF 15 to 20% by TTE and TEE in 09/2012.  Severe LV dysfunction  . Obesity    BMI 31 in 09/2012  . Persistent atrial fibrillation with rapid ventricular response ([place]) 04/21/2014  . Retroperitoneal bleed 04/21/2019  . Seizure disorder ([place]) latest  09/30/2012  . Seizures ([place])   . Stroke [facility])     MEDICATIONS: Current Outpatient Medications on File Prior to Visit  Medication Sig Dispense Refill  . acetaminophen (TYLENOL) 325 MG tablet Take 650 mg by mouth every 6 (six) hours as needed for mild pain.    [name] amiodarone (PACERONE) 200 MG tablet Take 1 tablet (200 mg total) by mouth 2 (two) times daily. 60 tablet 3  . carvedilol (COREG) 3.125 MG tablet Take 1 tablet (3.125 mg total) by mouth 2 (two) times daily with a meal. 180 tablet 2  . divalproex (DEPAKOTE ER) 500 MG 24 hr tablet TAKE 1 TABLET BY MOUTH ONCE DAILY AT NIGHT 30 tablet 0  . DULoxetine (CYMBALTA) 30 MG capsule Take 1 capsule by mouth once daily 90 capsule 1  . furosemide (LASIX) 40 MG tablet Take 1 tablet by mouth once daily 90 tablet 3  . levETIRAcetam (KEPPRA) 1000 MG tablet Take 1 tablet by mouth twice daily 60 tablet 0  . phenytoin (DILANTIN) 100 MG ER capsule Take 1 capsule by mouth twice daily 60 capsule 0  . simvastatin (ZOCOR) 20 MG tablet TAKE 1 TABLET BY MOUTH ONCE DAILY AT 6PM 90 tablet 1  .  tamsulosin (FLOMAX) 0.4 MG CAPS capsule Take 1 capsule by mouth once daily 90 capsule 1  . traMADol (ULTRAM) 50 MG tablet Take 1 tablet (50 mg total) by mouth every 6 (six) hours as needed for moderate pain. for pain 20 tablet 0   No current facility-administered medications on file prior to visit.   ALLERGIES: No Known Allergies  FAMILY HISTORY: Family History  Problem Relation Age of Onset  . Arthritis Mother   . Heart disease Mother   . Heart attack Mother   . Other Father        smoker  . Hypertension Neg Hx        unknown  . Stroke Neg Hx        unknown    SOCIAL HISTORY: Social History   Socioeconomic History  . Marital status: Married    Spouse name: Not on file  . Number of children: Not on file  . Years of education: Not on file  . Highest education level: Not on file  Occupational History  . Not on file  Tobacco Use  . Smoking status: Former  Smoker    Types: Pipe    Quit date: 09/21/2008    Years since quitting: 11.6  . Smokeless tobacco: Former Systems developer    Quit date: 09/21/2008  Substance and Sexual Activity  . Alcohol use: No  . Drug use: No  . Sexual activity: Not Currently    Birth control/protection: None  Other Topics Concern  . Not on file  Social History Narrative   Right handed   Apartment ground floor   Drinks caffeine       Social Determinants of Health   Financial Resource Strain:   . Difficulty of Paying Living Expenses:   Food Insecurity:   . Worried About Charity fundraiser in the Last Year:   . Arboriculturist in the Last Year:   Transportation Needs:   . Film/video editor (Medical):   [name] Lack of Transportation (Non-Medical):   Physical Activity:   . Days of Exercise per Week:   . Minutes of Exercise per Session:   Stress:   . Feeling of Stress :   Social Connections:   . Frequency of Communication with Friends and Family:   . Frequency of Social Gatherings with Friends and Family:   . Attends Religious Services:   . Active Member of Clubs or Organizations:   . Attends Archivist Meetings:   [name] Marital Status:   Intimate Partner Violence:   . Fear of Current or Ex-Partner:   . Emotionally Abused:   [name] Physically Abused:   . Sexually Abused:     REVIEW OF SYSTEMS: Constitutional: No fevers, chills, or sweats, no generalized fatigue, change in appetite Eyes: No visual changes, double vision, eye pain Ear, nose and throat: No hearing loss, ear pain, nasal congestion, sore throat Cardiovascular: No chest pain, palpitations Respiratory:  No shortness of breath at rest or with exertion, wheezes GastrointestinaI: No nausea, vomiting, diarrhea, abdominal pain, fecal incontinence Genitourinary:  No dysuria, urinary retention or frequency Musculoskeletal:  No neck pain, back pain Integumentary: No rash, pruritus, skin lesions Neurological: as above Psychiatric: No depression,  insomnia, anxiety Endocrine: No palpitations, fatigue, diaphoresis, mood swings, change in appetite, change in weight, increased thirst Hematologic/Lymphatic:  No anemia, purpura, petechiae. Allergic/Immunologic: no itchy/runny eyes, nasal congestion, recent allergic reactions, rashes  PHYSICAL EXAM: Vitals:   05/04/20 1424  BP: 110/79  Pulse: 95  Resp:  20  SpO2: 97%   General: No acute distress Head:  Normocephalic/atraumatic Skin/Extremities: No rash, no edema Neurological Exam: alert and oriented to person, place, and time. No aphasia or dysarthria. Fund of knowledge is appropriate.  Recent and remote memory are intact.  Attention and concentration are normal, able to spell WORLD but cannot spell it backward.Cranial nerves: Pupils equal, round, reactive to light.  Extraocular movements intact with no nystagmus. Visual fields full. No facial asymmetry. Motor: Bulk and tone normal, muscle strength 5/5 throughout with no pronator drift.    Finger to nose testing intact.  Gait narrow-based and steady. No resting tremor. He has mild bilateral postural and endpoint tremor.  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 Head CT showed medial left temporal encephalomalacia. Routine EEG unremarkable. He denies any seizures since December 2020, continue Levetiracetam 1000mg  BID, Dilantin 100mg  BID, and Depakote ER 500mg  qhs. He does not drive. Continue close supervision. Follow-up in 1 year, he knows to call for any changes.    Thank you for allowing me to participate in his care.  Please do not hesitate to call for any questions or concerns.   Ellouise Newer, M.D.   CC: Dr. Sharlet [place]

## 2020-05-04 NOTE — Patient Instructions (Signed)
Great to see you. Continue all your seizure medications: Depakote 500mg  every night, Dilantin 100mg  twice a day, Keppra 1000mg  twice a day. Follow-up in 1 year, call for any changes.  Seizure Precautions: 1. If medication has been prescribed for you to prevent seizures, take it exactly as directed.  Do not stop taking the medicine without talking to your doctor first, even if you have not had a seizure in a long time.   2. Avoid activities in which a seizure would cause danger to yourself or to others.  Don't operate dangerous machinery, swim alone, or climb in high or dangerous places, such as on ladders, roofs, or girders.  Do not drive unless your doctor says you may.  3. If you have any warning that you may have a seizure, lay down in a safe place where you can't hurt yourself.    4.  No driving for 6 months from last seizure, as per [facility].   Please refer to the following link on the [place] website for more information: http://www.epilepsyfoundation.org/answerplace/Social/driving/drivingu.cfm   5.  Maintain good sleep hygiene. Avoid alcohol.  6.  Contact your doctor if you have any problems that may be related to the medicine you are taking.  7.  Call 911 and bring the patient back to the ED if:        A.  The seizure lasts longer than 5 minutes.       B.  The patient doesn't awaken shortly after the seizure  C.  The patient has new problems such as difficulty seeing, speaking or moving  D.  The patient was injured during the seizure  E.  The patient has a temperature over 102 F (39C)  F.  The patient vomited and now is having trouble breathing

## 2020-05-13 ENCOUNTER — Inpatient Hospital Stay (HOSPITAL_COMMUNITY)
Admission: EM | Admit: 2020-05-13 | Discharge: 2020-06-23 | DRG: 003 | Disposition: E | Payer: Medicare Other | Attending: Neurosurgery | Admitting: Neurosurgery

## 2020-05-13 ENCOUNTER — Other Ambulatory Visit: Payer: Self-pay | Admitting: Oncology

## 2020-05-13 ENCOUNTER — Emergency Department (HOSPITAL_COMMUNITY): Payer: Medicare Other

## 2020-05-13 ENCOUNTER — Encounter (HOSPITAL_COMMUNITY): Admission: EM | Disposition: E | Payer: Self-pay | Source: Home / Self Care | Attending: Neurosurgery

## 2020-05-13 ENCOUNTER — Other Ambulatory Visit: Payer: Self-pay

## 2020-05-13 DIAGNOSIS — R911 Solitary pulmonary nodule: Secondary | ICD-10-CM | POA: Diagnosis not present

## 2020-05-13 DIAGNOSIS — D682 Hereditary deficiency of other clotting factors: Secondary | ICD-10-CM | POA: Diagnosis present

## 2020-05-13 DIAGNOSIS — S3992XA Unspecified injury of lower back, initial encounter: Secondary | ICD-10-CM | POA: Diagnosis not present

## 2020-05-13 DIAGNOSIS — R0689 Other abnormalities of breathing: Secondary | ICD-10-CM | POA: Diagnosis not present

## 2020-05-13 DIAGNOSIS — Z8249 Family history of ischemic heart disease and other diseases of the circulatory system: Secondary | ICD-10-CM

## 2020-05-13 DIAGNOSIS — Z0189 Encounter for other specified special examinations: Secondary | ICD-10-CM

## 2020-05-13 DIAGNOSIS — J151 Pneumonia due to Pseudomonas: Secondary | ICD-10-CM | POA: Diagnosis not present

## 2020-05-13 DIAGNOSIS — Y848 Other medical procedures as the cause of abnormal reaction of the patient, or of later complication, without mention of misadventure at the time of the procedure: Secondary | ICD-10-CM | POA: Diagnosis not present

## 2020-05-13 DIAGNOSIS — J90 Pleural effusion, not elsewhere classified: Secondary | ICD-10-CM | POA: Diagnosis not present

## 2020-05-13 DIAGNOSIS — R569 Unspecified convulsions: Secondary | ICD-10-CM | POA: Diagnosis not present

## 2020-05-13 DIAGNOSIS — M50321 Other cervical disc degeneration at C4-C5 level: Secondary | ICD-10-CM | POA: Diagnosis not present

## 2020-05-13 DIAGNOSIS — I4819 Other persistent atrial fibrillation: Secondary | ICD-10-CM | POA: Diagnosis present

## 2020-05-13 DIAGNOSIS — Z66 Do not resuscitate: Secondary | ICD-10-CM | POA: Diagnosis not present

## 2020-05-13 DIAGNOSIS — Z515 Encounter for palliative care: Secondary | ICD-10-CM | POA: Diagnosis not present

## 2020-05-13 DIAGNOSIS — G9589 Other specified diseases of spinal cord: Secondary | ICD-10-CM | POA: Diagnosis not present

## 2020-05-13 DIAGNOSIS — T148XXA Other injury of unspecified body region, initial encounter: Secondary | ICD-10-CM

## 2020-05-13 DIAGNOSIS — S22050A Wedge compression fracture of T5-T6 vertebra, initial encounter for closed fracture: Secondary | ICD-10-CM | POA: Diagnosis not present

## 2020-05-13 DIAGNOSIS — I5022 Chronic systolic (congestive) heart failure: Secondary | ICD-10-CM | POA: Diagnosis present

## 2020-05-13 DIAGNOSIS — Z8614 Personal history of Methicillin resistant Staphylococcus aureus infection: Secondary | ICD-10-CM

## 2020-05-13 DIAGNOSIS — S299XXA Unspecified injury of thorax, initial encounter: Secondary | ICD-10-CM | POA: Diagnosis not present

## 2020-05-13 DIAGNOSIS — R579 Shock, unspecified: Secondary | ICD-10-CM | POA: Diagnosis not present

## 2020-05-13 DIAGNOSIS — E669 Obesity, unspecified: Secondary | ICD-10-CM | POA: Diagnosis present

## 2020-05-13 DIAGNOSIS — G8220 Paraplegia, unspecified: Secondary | ICD-10-CM | POA: Diagnosis not present

## 2020-05-13 DIAGNOSIS — R0902 Hypoxemia: Secondary | ICD-10-CM | POA: Diagnosis not present

## 2020-05-13 DIAGNOSIS — Z87891 Personal history of nicotine dependence: Secondary | ICD-10-CM

## 2020-05-13 DIAGNOSIS — M4802 Spinal stenosis, cervical region: Secondary | ICD-10-CM | POA: Diagnosis not present

## 2020-05-13 DIAGNOSIS — J9601 Acute respiratory failure with hypoxia: Secondary | ICD-10-CM

## 2020-05-13 DIAGNOSIS — W19XXXA Unspecified fall, initial encounter: Secondary | ICD-10-CM | POA: Diagnosis not present

## 2020-05-13 DIAGNOSIS — M50223 Other cervical disc displacement at C6-C7 level: Secondary | ICD-10-CM | POA: Diagnosis not present

## 2020-05-13 DIAGNOSIS — A4151 Sepsis due to Escherichia coli [E. coli]: Secondary | ICD-10-CM | POA: Diagnosis not present

## 2020-05-13 DIAGNOSIS — C9000 Multiple myeloma not having achieved remission: Secondary | ICD-10-CM | POA: Diagnosis not present

## 2020-05-13 DIAGNOSIS — J9621 Acute and chronic respiratory failure with hypoxia: Secondary | ICD-10-CM | POA: Diagnosis not present

## 2020-05-13 DIAGNOSIS — D649 Anemia, unspecified: Secondary | ICD-10-CM | POA: Diagnosis not present

## 2020-05-13 DIAGNOSIS — R918 Other nonspecific abnormal finding of lung field: Secondary | ICD-10-CM | POA: Diagnosis not present

## 2020-05-13 DIAGNOSIS — E11649 Type 2 diabetes mellitus with hypoglycemia without coma: Secondary | ICD-10-CM | POA: Diagnosis not present

## 2020-05-13 DIAGNOSIS — R4182 Altered mental status, unspecified: Secondary | ICD-10-CM | POA: Diagnosis not present

## 2020-05-13 DIAGNOSIS — E872 Acidosis: Secondary | ICD-10-CM | POA: Diagnosis not present

## 2020-05-13 DIAGNOSIS — G9520 Unspecified cord compression: Secondary | ICD-10-CM | POA: Diagnosis present

## 2020-05-13 DIAGNOSIS — Z951 Presence of aortocoronary bypass graft: Secondary | ICD-10-CM

## 2020-05-13 DIAGNOSIS — J95851 Ventilator associated pneumonia: Secondary | ICD-10-CM | POA: Diagnosis not present

## 2020-05-13 DIAGNOSIS — Y9301 Activity, walking, marching and hiking: Secondary | ICD-10-CM | POA: Diagnosis present

## 2020-05-13 DIAGNOSIS — J189 Pneumonia, unspecified organism: Secondary | ICD-10-CM | POA: Diagnosis not present

## 2020-05-13 DIAGNOSIS — M8458XA Pathological fracture in neoplastic disease, other specified site, initial encounter for fracture: Secondary | ICD-10-CM | POA: Diagnosis present

## 2020-05-13 DIAGNOSIS — D696 Thrombocytopenia, unspecified: Secondary | ICD-10-CM | POA: Diagnosis not present

## 2020-05-13 DIAGNOSIS — Z978 Presence of other specified devices: Secondary | ICD-10-CM | POA: Diagnosis not present

## 2020-05-13 DIAGNOSIS — S064X9A Epidural hemorrhage with loss of consciousness of unspecified duration, initial encounter: Secondary | ICD-10-CM | POA: Diagnosis not present

## 2020-05-13 DIAGNOSIS — D62 Acute posthemorrhagic anemia: Secondary | ICD-10-CM

## 2020-05-13 DIAGNOSIS — S199XXA Unspecified injury of neck, initial encounter: Secondary | ICD-10-CM | POA: Diagnosis not present

## 2020-05-13 DIAGNOSIS — N183 Chronic kidney disease, stage 3 unspecified: Secondary | ICD-10-CM | POA: Diagnosis present

## 2020-05-13 DIAGNOSIS — R0781 Pleurodynia: Secondary | ICD-10-CM | POA: Diagnosis not present

## 2020-05-13 DIAGNOSIS — Z4659 Encounter for fitting and adjustment of other gastrointestinal appliance and device: Secondary | ICD-10-CM

## 2020-05-13 DIAGNOSIS — R17 Unspecified jaundice: Secondary | ICD-10-CM | POA: Diagnosis not present

## 2020-05-13 DIAGNOSIS — M549 Dorsalgia, unspecified: Secondary | ICD-10-CM

## 2020-05-13 DIAGNOSIS — S3991XA Unspecified injury of abdomen, initial encounter: Secondary | ICD-10-CM | POA: Diagnosis not present

## 2020-05-13 DIAGNOSIS — M546 Pain in thoracic spine: Secondary | ICD-10-CM | POA: Diagnosis not present

## 2020-05-13 DIAGNOSIS — B965 Pseudomonas (aeruginosa) (mallei) (pseudomallei) as the cause of diseases classified elsewhere: Secondary | ICD-10-CM | POA: Diagnosis not present

## 2020-05-13 DIAGNOSIS — J9600 Acute respiratory failure, unspecified whether with hypoxia or hypercapnia: Secondary | ICD-10-CM | POA: Diagnosis not present

## 2020-05-13 DIAGNOSIS — I2510 Atherosclerotic heart disease of native coronary artery without angina pectoris: Secondary | ICD-10-CM | POA: Diagnosis present

## 2020-05-13 DIAGNOSIS — S064XAA Epidural hemorrhage with loss of consciousness status unknown, initial encounter: Secondary | ICD-10-CM

## 2020-05-13 DIAGNOSIS — S24109A Unspecified injury at unspecified level of thoracic spinal cord, initial encounter: Secondary | ICD-10-CM | POA: Diagnosis not present

## 2020-05-13 DIAGNOSIS — M50322 Other cervical disc degeneration at C5-C6 level: Secondary | ICD-10-CM | POA: Diagnosis not present

## 2020-05-13 DIAGNOSIS — J9811 Atelectasis: Secondary | ICD-10-CM | POA: Diagnosis not present

## 2020-05-13 DIAGNOSIS — E870 Hyperosmolality and hypernatremia: Secondary | ICD-10-CM | POA: Diagnosis not present

## 2020-05-13 DIAGNOSIS — S0990XA Unspecified injury of head, initial encounter: Secondary | ICD-10-CM | POA: Diagnosis not present

## 2020-05-13 DIAGNOSIS — Z981 Arthrodesis status: Secondary | ICD-10-CM | POA: Diagnosis not present

## 2020-05-13 DIAGNOSIS — S24102A Unspecified injury at T2-T6 level of thoracic spinal cord, initial encounter: Secondary | ICD-10-CM | POA: Diagnosis present

## 2020-05-13 DIAGNOSIS — Y95 Nosocomial condition: Secondary | ICD-10-CM | POA: Diagnosis not present

## 2020-05-13 DIAGNOSIS — K828 Other specified diseases of gallbladder: Secondary | ICD-10-CM | POA: Diagnosis not present

## 2020-05-13 DIAGNOSIS — N390 Urinary tract infection, site not specified: Secondary | ICD-10-CM | POA: Diagnosis present

## 2020-05-13 DIAGNOSIS — I4891 Unspecified atrial fibrillation: Secondary | ICD-10-CM | POA: Diagnosis not present

## 2020-05-13 DIAGNOSIS — G92 Toxic encephalopathy: Secondary | ICD-10-CM | POA: Diagnosis not present

## 2020-05-13 DIAGNOSIS — R6521 Severe sepsis with septic shock: Secondary | ICD-10-CM | POA: Diagnosis not present

## 2020-05-13 DIAGNOSIS — D689 Coagulation defect, unspecified: Secondary | ICD-10-CM

## 2020-05-13 DIAGNOSIS — S24159A Other incomplete lesion at unspecified level of thoracic spinal cord, initial encounter: Secondary | ICD-10-CM | POA: Diagnosis not present

## 2020-05-13 DIAGNOSIS — G9341 Metabolic encephalopathy: Secondary | ICD-10-CM | POA: Diagnosis not present

## 2020-05-13 DIAGNOSIS — Z419 Encounter for procedure for purposes other than remedying health state, unspecified: Secondary | ICD-10-CM

## 2020-05-13 DIAGNOSIS — J155 Pneumonia due to Escherichia coli: Secondary | ICD-10-CM | POA: Diagnosis not present

## 2020-05-13 DIAGNOSIS — S064X0A Epidural hemorrhage without loss of consciousness, initial encounter: Secondary | ICD-10-CM | POA: Diagnosis not present

## 2020-05-13 DIAGNOSIS — M8448XA Pathological fracture, other site, initial encounter for fracture: Secondary | ICD-10-CM

## 2020-05-13 DIAGNOSIS — Z79899 Other long term (current) drug therapy: Secondary | ICD-10-CM

## 2020-05-13 DIAGNOSIS — D492 Neoplasm of unspecified behavior of bone, soft tissue, and skin: Secondary | ICD-10-CM | POA: Diagnosis not present

## 2020-05-13 DIAGNOSIS — K869 Disease of pancreas, unspecified: Secondary | ICD-10-CM

## 2020-05-13 DIAGNOSIS — Z20822 Contact with and (suspected) exposure to covid-19: Secondary | ICD-10-CM | POA: Diagnosis not present

## 2020-05-13 DIAGNOSIS — Z4682 Encounter for fitting and adjustment of non-vascular catheter: Secondary | ICD-10-CM | POA: Diagnosis not present

## 2020-05-13 DIAGNOSIS — Z430 Encounter for attention to tracheostomy: Secondary | ICD-10-CM

## 2020-05-13 DIAGNOSIS — W010XXA Fall on same level from slipping, tripping and stumbling without subsequent striking against object, initial encounter: Secondary | ICD-10-CM | POA: Diagnosis present

## 2020-05-13 DIAGNOSIS — E7220 Disorder of urea cycle metabolism, unspecified: Secondary | ICD-10-CM | POA: Diagnosis not present

## 2020-05-13 DIAGNOSIS — N179 Acute kidney failure, unspecified: Secondary | ICD-10-CM | POA: Diagnosis not present

## 2020-05-13 DIAGNOSIS — R410 Disorientation, unspecified: Secondary | ICD-10-CM | POA: Diagnosis not present

## 2020-05-13 DIAGNOSIS — Z9911 Dependence on respirator [ventilator] status: Secondary | ICD-10-CM | POA: Diagnosis not present

## 2020-05-13 DIAGNOSIS — E785 Hyperlipidemia, unspecified: Secondary | ICD-10-CM | POA: Diagnosis present

## 2020-05-13 DIAGNOSIS — R58 Hemorrhage, not elsewhere classified: Secondary | ICD-10-CM

## 2020-05-13 DIAGNOSIS — M961 Postlaminectomy syndrome, not elsewhere classified: Secondary | ICD-10-CM | POA: Diagnosis not present

## 2020-05-13 DIAGNOSIS — I10 Essential (primary) hypertension: Secondary | ICD-10-CM | POA: Diagnosis not present

## 2020-05-13 DIAGNOSIS — S22059A Unspecified fracture of T5-T6 vertebra, initial encounter for closed fracture: Secondary | ICD-10-CM | POA: Diagnosis not present

## 2020-05-13 DIAGNOSIS — Z930 Tracheostomy status: Secondary | ICD-10-CM

## 2020-05-13 DIAGNOSIS — Z86010 Personal history of colonic polyps: Secondary | ICD-10-CM

## 2020-05-13 DIAGNOSIS — Z6831 Body mass index (BMI) 31.0-31.9, adult: Secondary | ICD-10-CM

## 2020-05-13 DIAGNOSIS — C7951 Secondary malignant neoplasm of bone: Secondary | ICD-10-CM | POA: Diagnosis not present

## 2020-05-13 DIAGNOSIS — D699 Hemorrhagic condition, unspecified: Secondary | ICD-10-CM | POA: Diagnosis not present

## 2020-05-13 DIAGNOSIS — G40909 Epilepsy, unspecified, not intractable, without status epilepticus: Secondary | ICD-10-CM | POA: Diagnosis present

## 2020-05-13 DIAGNOSIS — J9690 Respiratory failure, unspecified, unspecified whether with hypoxia or hypercapnia: Secondary | ICD-10-CM | POA: Diagnosis not present

## 2020-05-13 DIAGNOSIS — S065X0A Traumatic subdural hemorrhage without loss of consciousness, initial encounter: Secondary | ICD-10-CM | POA: Diagnosis not present

## 2020-05-13 DIAGNOSIS — A419 Sepsis, unspecified organism: Secondary | ICD-10-CM | POA: Diagnosis not present

## 2020-05-13 DIAGNOSIS — M5489 Other dorsalgia: Secondary | ICD-10-CM | POA: Diagnosis not present

## 2020-05-13 DIAGNOSIS — R140 Abdominal distension (gaseous): Secondary | ICD-10-CM

## 2020-05-13 DIAGNOSIS — S32038A Other fracture of third lumbar vertebra, initial encounter for closed fracture: Secondary | ICD-10-CM | POA: Diagnosis not present

## 2020-05-13 DIAGNOSIS — Z8673 Personal history of transient ischemic attack (TIA), and cerebral infarction without residual deficits: Secondary | ICD-10-CM

## 2020-05-13 DIAGNOSIS — I255 Ischemic cardiomyopathy: Secondary | ICD-10-CM | POA: Diagnosis present

## 2020-05-13 DIAGNOSIS — R710 Precipitous drop in hematocrit: Secondary | ICD-10-CM | POA: Diagnosis not present

## 2020-05-13 DIAGNOSIS — S8992XA Unspecified injury of left lower leg, initial encounter: Secondary | ICD-10-CM | POA: Diagnosis not present

## 2020-05-13 DIAGNOSIS — I130 Hypertensive heart and chronic kidney disease with heart failure and stage 1 through stage 4 chronic kidney disease, or unspecified chronic kidney disease: Secondary | ICD-10-CM | POA: Diagnosis not present

## 2020-05-13 DIAGNOSIS — G9340 Encephalopathy, unspecified: Secondary | ICD-10-CM | POA: Diagnosis not present

## 2020-05-13 DIAGNOSIS — Z743 Need for continuous supervision: Secondary | ICD-10-CM | POA: Diagnosis not present

## 2020-05-13 DIAGNOSIS — K7200 Acute and subacute hepatic failure without coma: Secondary | ICD-10-CM | POA: Diagnosis not present

## 2020-05-13 DIAGNOSIS — Z95810 Presence of automatic (implantable) cardiac defibrillator: Secondary | ICD-10-CM

## 2020-05-13 HISTORY — PX: THORACIC LAMINECTOMY FOR EPIDURAL ABSCESS: SHX6115

## 2020-05-13 LAB — URINALYSIS, ROUTINE W REFLEX MICROSCOPIC
Bilirubin Urine: NEGATIVE
Glucose, UA: NEGATIVE mg/dL
Ketones, ur: NEGATIVE mg/dL
Nitrite: POSITIVE — AB
Protein, ur: NEGATIVE mg/dL
Specific Gravity, Urine: 1.015 (ref 1.005–1.030)
WBC, UA: >50 WBC/hpf — ABNORMAL HIGH (ref 0–5)
pH: 5 (ref 5.0–8.0)

## 2020-05-13 LAB — CBC WITH DIFFERENTIAL/PLATELET
Abs Immature Granulocytes: 0 10*3/uL (ref 0.00–0.07)
Basophils Absolute: 0 10*3/uL (ref 0.0–0.1)
Basophils Relative: 0 %
Eosinophils Absolute: 0 10*3/uL (ref 0.0–0.5)
Eosinophils Relative: 1 %
HCT: 41.5 % (ref 39.0–52.0)
Hemoglobin: 12.6 g/dL — ABNORMAL LOW (ref 13.0–17.0)
Lymphocytes Relative: 18 %
Lymphs Abs: 0.6 10*3/uL — ABNORMAL LOW (ref 0.7–4.0)
MCH: 27.8 pg (ref 26.0–34.0)
MCHC: 30.4 g/dL (ref 30.0–36.0)
MCV: 91.4 fL (ref 80.0–100.0)
Monocytes Absolute: 0.2 10*3/uL (ref 0.1–1.0)
Monocytes Relative: 6 %
Neutro Abs: 2.6 10*3/uL (ref 1.7–7.7)
Neutrophils Relative %: 75 %
Platelets: 191 10*3/uL (ref 150–400)
RBC: 4.54 MIL/uL (ref 4.22–5.81)
RDW: 19.6 % — ABNORMAL HIGH (ref 11.5–15.5)
WBC: 3.5 10*3/uL — ABNORMAL LOW (ref 4.0–10.5)
nRBC: 0 % (ref 0.0–0.2)
nRBC: 0 /100 WBC

## 2020-05-13 LAB — COMPREHENSIVE METABOLIC PANEL
ALT: 28 U/L (ref 0–44)
AST: 36 U/L (ref 15–41)
Albumin: 2.9 g/dL — ABNORMAL LOW (ref 3.5–5.0)
Alkaline Phosphatase: 99 U/L (ref 38–126)
Anion gap: 8 (ref 5–15)
BUN: 36 mg/dL — ABNORMAL HIGH (ref 8–23)
CO2: 23 mmol/L (ref 22–32)
Calcium: 8.4 mg/dL — ABNORMAL LOW (ref 8.9–10.3)
Chloride: 104 mmol/L (ref 98–111)
Creatinine, Ser: 1.51 mg/dL — ABNORMAL HIGH (ref 0.61–1.24)
GFR calc Af Amer: 53 mL/min — ABNORMAL LOW (ref >60)
GFR calc non Af Amer: 46 mL/min — ABNORMAL LOW (ref >60)
Glucose, Bld: 129 mg/dL — ABNORMAL HIGH (ref 70–99)
Potassium: 4 mmol/L (ref 3.5–5.1)
Sodium: 135 mmol/L (ref 135–145)
Total Bilirubin: 1 mg/dL (ref 0.3–1.2)
Total Protein: 10.8 g/dL — ABNORMAL HIGH (ref 6.5–8.1)

## 2020-05-13 LAB — I-STAT CHEM 8, ED
BUN: 41 mg/dL — ABNORMAL HIGH (ref 8–23)
Calcium, Ion: 1 mmol/L — ABNORMAL LOW (ref 1.15–1.40)
Chloride: 104 mmol/L (ref 98–111)
Creatinine, Ser: 1.4 mg/dL — ABNORMAL HIGH (ref 0.61–1.24)
Glucose, Bld: 122 mg/dL — ABNORMAL HIGH (ref 70–99)
HCT: 46 % (ref 39.0–52.0)
Hemoglobin: 15.6 g/dL (ref 13.0–17.0)
Potassium: 3.6 mmol/L (ref 3.5–5.1)
Sodium: 139 mmol/L (ref 135–145)
TCO2: 25 mmol/L (ref 22–32)

## 2020-05-13 LAB — PROTIME-INR
INR: 1.4 — ABNORMAL HIGH (ref 0.8–1.2)
Prothrombin Time: 16.7 seconds — ABNORMAL HIGH (ref 11.4–15.2)

## 2020-05-13 LAB — APTT: aPTT: 52 seconds — ABNORMAL HIGH (ref 24–36)

## 2020-05-13 LAB — SARS CORONAVIRUS 2 BY RT PCR (HOSPITAL ORDER, PERFORMED IN ~~LOC~~ HOSPITAL LAB): SARS Coronavirus 2: NEGATIVE

## 2020-05-13 SURGERY — THORACIC LAMINECTOMY FOR EPIDURAL ABSCESS
Anesthesia: General | Site: Back

## 2020-05-13 MED ORDER — TRANEXAMIC ACID 1000 MG/10ML IV SOLN
1000.0000 mg | Freq: Once | INTRAVENOUS | Status: AC
  Administered 2020-05-14: 1000 mg via INTRAVENOUS
  Filled 2020-05-13: qty 10

## 2020-05-13 MED ORDER — SODIUM CHLORIDE 0.9 % IV SOLN
1.0000 g | Freq: Once | INTRAVENOUS | Status: AC
  Administered 2020-05-13: 1 g via INTRAVENOUS
  Filled 2020-05-13: qty 10

## 2020-05-13 MED ORDER — FENTANYL CITRATE (PF) 100 MCG/2ML IJ SOLN
50.0000 ug | Freq: Once | INTRAMUSCULAR | Status: AC
  Administered 2020-05-13: 50 ug via INTRAVENOUS
  Filled 2020-05-13: qty 2

## 2020-05-13 MED ORDER — PROPOFOL 10 MG/ML IV BOLUS
INTRAVENOUS | Status: AC
  Filled 2020-05-13: qty 20

## 2020-05-13 MED ORDER — GADOBUTROL 1 MMOL/ML IV SOLN
8.7000 mL | Freq: Once | INTRAVENOUS | Status: AC | PRN
  Administered 2020-05-13: 8.7 mL via INTRAVENOUS

## 2020-05-13 MED ORDER — IOHEXOL 300 MG/ML  SOLN
100.0000 mL | Freq: Once | INTRAMUSCULAR | Status: AC | PRN
  Administered 2020-05-13: 100 mL via INTRAVENOUS

## 2020-05-13 MED ORDER — SODIUM CHLORIDE 0.9% IV SOLUTION: Freq: Once | INTRAVENOUS | Status: DC

## 2020-05-13 MED ORDER — FENTANYL CITRATE (PF) 250 MCG/5ML IJ SOLN
INTRAMUSCULAR | Status: AC
  Filled 2020-05-13: qty 5

## 2020-05-13 MED ORDER — CEFAZOLIN SODIUM-DEXTROSE 2-4 GM/100ML-% IV SOLN
INTRAVENOUS | Status: AC
  Filled 2020-05-13: qty 100

## 2020-05-13 MED ORDER — SODIUM CHLORIDE 0.9 % IV BOLUS
1000.0000 mL | Freq: Once | INTRAVENOUS | Status: AC
  Administered 2020-05-13: 1000 mL via INTRAVENOUS

## 2020-05-13 MED ORDER — TRANEXAMIC ACID-NACL 1000-0.7 MG/100ML-% IV SOLN
1000.0000 mg | Freq: Once | INTRAVENOUS | Status: AC
  Administered 2020-05-14: 1000 mg via INTRAVENOUS
  Filled 2020-05-13 (×2): qty 100

## 2020-05-13 SURGICAL SUPPLY — 52 items
BAG DECANTER FOR FLEXI CONT (MISCELLANEOUS) ×3 IMPLANT
BAND RUBBER #18 3X1/16 STRL (MISCELLANEOUS) ×6 IMPLANT
BENZOIN TINCTURE PRP APPL 2/3 (GAUZE/BANDAGES/DRESSINGS) IMPLANT
BLADE CLIPPER SURG (BLADE) IMPLANT
BUR MATCHSTICK NEURO 3.0 LAGG (BURR) ×3 IMPLANT
CANISTER SUCT 3000ML PPV (MISCELLANEOUS) ×3 IMPLANT
CARTRIDGE OIL MAESTRO DRILL (MISCELLANEOUS) ×1 IMPLANT
CLOSURE WOUND 1/2 X4 (GAUZE/BANDAGES/DRESSINGS)
COVER WAND RF STERILE (DRAPES) ×3 IMPLANT
DECANTER SPIKE VIAL GLASS SM (MISCELLANEOUS) ×3 IMPLANT
DERMABOND ADVANCED (GAUZE/BANDAGES/DRESSINGS) ×2
DERMABOND ADVANCED .7 DNX12 (GAUZE/BANDAGES/DRESSINGS) ×1 IMPLANT
DIFFUSER DRILL AIR PNEUMATIC (MISCELLANEOUS) ×3 IMPLANT
DRAPE C-ARM 35X43 STRL (DRAPES) ×9 IMPLANT
DRAPE LAPAROTOMY 100X72X124 (DRAPES) ×3 IMPLANT
DRAPE MICROSCOPE LEICA (MISCELLANEOUS) ×3 IMPLANT
DRAPE SURG 17X23 STRL (DRAPES) ×3 IMPLANT
DRSG OPSITE 4X5.5 SM (GAUZE/BANDAGES/DRESSINGS) ×3 IMPLANT
DRSG OPSITE POSTOP 4X8 (GAUZE/BANDAGES/DRESSINGS) ×3 IMPLANT
DURAPREP 26ML APPLICATOR (WOUND CARE) ×3 IMPLANT
ELECT REM PT RETURN 9FT ADLT (ELECTROSURGICAL) ×3
ELECTRODE REM PT RTRN 9FT ADLT (ELECTROSURGICAL) ×1 IMPLANT
GAUZE 4X4 16PLY RFD (DISPOSABLE) IMPLANT
GAUZE SPONGE 4X4 12PLY STRL (GAUZE/BANDAGES/DRESSINGS) IMPLANT
GLOVE BIO SURGEON STRL SZ 6.5 (GLOVE) ×2 IMPLANT
GLOVE BIO SURGEON STRL SZ7 (GLOVE) ×6 IMPLANT
GLOVE BIO SURGEONS STRL SZ 6.5 (GLOVE) ×1
GLOVE ECLIPSE 6.5 STRL STRAW (GLOVE) ×3 IMPLANT
GLOVE EXAM NITRILE XL STR (GLOVE) IMPLANT
GOWN STRL REUS W/ TWL LRG LVL3 (GOWN DISPOSABLE) ×2 IMPLANT
GOWN STRL REUS W/ TWL XL LVL3 (GOWN DISPOSABLE) IMPLANT
GOWN STRL REUS W/TWL 2XL LVL3 (GOWN DISPOSABLE) IMPLANT
GOWN STRL REUS W/TWL LRG LVL3 (GOWN DISPOSABLE) ×4
GOWN STRL REUS W/TWL XL LVL3 (GOWN DISPOSABLE)
KIT BASIN OR (CUSTOM PROCEDURE TRAY) ×3 IMPLANT
KIT TURNOVER KIT B (KITS) ×3 IMPLANT
NEEDLE HYPO 25X1 1.5 SAFETY (NEEDLE) ×3 IMPLANT
NEEDLE SPNL 18GX3.5 QUINCKE PK (NEEDLE) IMPLANT
NS IRRIG 1000ML POUR BTL (IV SOLUTION) ×3 IMPLANT
OIL CARTRIDGE MAESTRO DRILL (MISCELLANEOUS) ×3
PACK LAMINECTOMY NEURO (CUSTOM PROCEDURE TRAY) ×3 IMPLANT
PAD ARMBOARD 7.5X6 YLW CONV (MISCELLANEOUS) ×9 IMPLANT
SPONGE LAP 4X18 RFD (DISPOSABLE) IMPLANT
SPONGE SURGIFOAM ABS GEL SZ50 (HEMOSTASIS) ×3 IMPLANT
STRIP CLOSURE SKIN 1/2X4 (GAUZE/BANDAGES/DRESSINGS) IMPLANT
SUT VIC AB 0 CT1 18XCR BRD8 (SUTURE) ×1 IMPLANT
SUT VIC AB 0 CT1 8-18 (SUTURE) ×2
SUT VIC AB 2-0 CT1 18 (SUTURE) ×3 IMPLANT
SUT VIC AB 3-0 SH 8-18 (SUTURE) ×3 IMPLANT
TOWEL GREEN STERILE (TOWEL DISPOSABLE) ×3 IMPLANT
TOWEL GREEN STERILE FF (TOWEL DISPOSABLE) ×3 IMPLANT
WATER STERILE IRR 1000ML POUR (IV SOLUTION) ×3 IMPLANT

## 2020-05-13 NOTE — Consult Note (Signed)
Called by Dr Christella Noa re this 74 y/o [name] with a history of severe bleeding postprocedures, now s/p fall with CT scans and thoracolumbar MRI showing a large intraspinal hematoma with severe cord compression at T4-T7. Neurosurgery was consulted to evacuate the clot/relieve the cord compression and eventually address the compression fractures. Heme was asked to advise re how to obtain hemostasis w/o causing overshoot clotting in this patient with multiple comorbidities including severe congestive heart failure.  I have reviewed the extensive workup here by Dr Irene Limbo and the even more extensive workup at [facility] by Dr Sterling Big. Despite their best efforts a definitive diagnosis could not be made, though it seems clear von Willebrand's Disease is not the cause and there is no clear evidence for a factor inhibitor. Repeatedly low factor VII levels have been noted though the patient's levels most recently have been >50% (which should be adequate for hemostasis). Dr Gaylyn Cheers in her note suggests platelets and antifibrinolytics in case of a bleed.  The coagulation system is more complex than we understand and I think it is important to remember we may do harm by overcorrecting as much as by failing to correct. In [name]'s case we have a well documented history of severe hemorrhage with even minor procedures, no definitive diagnosis despite very extensive workup at a world-class coagulation institution, and a clear emergency that requires surgical intervention.  Accordingly we have to treat empirically. I would give 3 units FFP pre-procedure, and consider repeat post depending on volume status. Also a unit of platelets before the procedure and another after, regardless of platelet counts. The post-procedure unit can be given as a 3-our infusion. I would also give tranexamic acid 1 g IV pre procedure and then 1 g by continuous infusion over 6 hours. I have written these orders to   If hemostasis is not achieved with  these measures I would ask pharmacy to dose novoseven at 20 mcg/kg and repeat every 4 hours as needed.  I will be off call as of 7 AM but my partner Dr Lorenso Courier will folllow with you in AM  Lurline Del MD [phone] (pager) [phone] (mobile)

## 2020-05-13 NOTE — ED Notes (Signed)
Multiple RNs unable to obtain IV, Dr Sedonia Small aware

## 2020-05-13 NOTE — ED Notes (Signed)
Pt transported to CT ?

## 2020-05-13 NOTE — ED Provider Notes (Signed)
[facility] EMERGENCY DEPARTMENT Provider Note   CSN: [id number] Arrival date & time: 05/18/2020  1425   History Chief Complaint  Patient presents with  . Fall    [facility] [name] is a 74 y.o. male with history of A.fib not on blood thinners, hx of retroperitoneal bleed, ischemic cardiomyopathy EF 15-20%, hx of CVA, seizures, dementia who presents with a fall. Pt states he was using his walker to go to the bathroom and fell on to the ground on his buttocks. He states his legs just gave out on him. His wife heard him and she and her son helped him get back in to bed. She waited about an hour to see if his pain would calm down and gave him a Tramadol but it didn't so she called EMS. The patient is somewhat of a poor historian but is able to say that he is hurting across the bilateral rib cage and Ford back. He states he is having difficulty moving his legs, especially the right side.   HPI     Past Medical History:  Diagnosis Date  . AICD (automatic cardioverter/defibrillator) present   . Atrial fibrillation   09/22/2012  . Blood loss anemia 04/18/2017   After GI bleed from colonoscopy and polypectomy  . CAD (coronary artery disease)   . Chronic systolic heart failure ([place])   . 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 GIB (gastrointestinal bleeding) 02/20/2017  . History of MRSA infection 05/2011  . HTN (hypertension)   . Hx of adenomatous colonic polyps 02/20/2017   01/2017 - 3 cm sigmoid TV adenoma and other smaller polyps - had post-polypectomy bleed Tx w/ clips Consider repeat colonoscopy 3 yrs [name], MD, [name]   . Hyperlipidemia   . implantable cardiac defibrillator-Biotronik    Device Implanted 2006; s/p gen change 03/2011 : bleeding persistent with pocket erosion and infection; explant and reimplant  06/2011  . Ischemic cardiomyopathy    EF 15 to 20% by TTE and TEE in 09/2012.  Severe LV dysfunction  . Obesity     BMI 31 in 09/2012  . Persistent atrial fibrillation with rapid ventricular response ([place]) 04/21/2014  . Retroperitoneal bleed 04/21/2019  . Seizure disorder ([place]) latest 09/30/2012  . Seizures ([place])   . Stroke [facility])     Patient Active Problem List   Diagnosis Date Noted  . Secondary hypercoagulable state ([place]) 04/12/2020  . Acute blood loss anemia 01/13/2020  . Anemia 01/12/2020  . ARF (acute renal failure) ([place]) 01/12/2020  . Hyponatremia 01/12/2020  . Gross hematuria 12/11/2019  . ICD (implantable cardioverter-defibrillator) pocket hematoma, subsequent encounter 11/09/2019  . Hematoma 10/29/2019  . Upper back pain 10/29/2019  . Nonischemic cardiomyopathy ([place]) 10/22/2019  . ICD (implantable cardioverter-defibrillator) pocket hematoma 09/17/2019  . Complications due to automatic implantable cardioverter-defibrillator   . Hospice care patient 08/05/2019  . Palliative care patient 08/05/2019  . Oral bleeding   . Coagulopathy ([place])   . Anemia due to blood loss, acute 07/20/2019  . Retroperitoneal bleed 04/21/2019  . Retroperitoneal hematoma   . Right arm pain 11/26/2018  . Hematoma of chest wall 03/26/2018  . Leg pain 03/04/2018  . 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 Elevated TSH 10/27/2017  . Weakness of both Ford extremities 10/27/2017  . Unsteady gait 10/27/2017  . Localization-related symptomatic epilepsy and epileptic syndromes with complex partial seizures, not intractable, without status epilepticus ([place]) 10/08/2017  . Insomnia 09/02/2017  .  Degenerative joint disease of knee, left 08/01/2017  . Blood loss anemia 04/18/2017  . Hx of adenomatous colonic polyps 02/20/2017  . Chronic pain of left knee 01/22/2017  . Chronic kidney disease (CKD), stage III (moderate) ([place]) 11/08/2016  . Hypertensive heart disease without heart failure   . Chronic chest wall pain 08/17/2016  . Periodontal disease 05/18/2016  . HLD (hyperlipidemia) 02/26/2016  . CVA (cerebral  infarction) 02/26/2016  . Near syncope 02/26/2016  . Gout 12/30/2015  . Persistent atrial fibrillation ([place]) 04/21/2014  . Chronic systolic CHF (congestive heart failure), NYHA class 2 ([place]) 11/06/2012  . 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 Seizure ([place]) 10/01/2012  . Renal insufficiency 10/01/2012  . Ischemic cardiomyopathy  ? additional rate component 09/29/2012  . Automatic implantable cardioverter-defibrillator in situ   . HTN (hypertension) 09/21/2012  . Coronary artery disease involving native heart 09/21/2012    Past Surgical History:  Procedure Laterality Date  . ALVEOLOPLASTY N/A 07/26/2019   Procedure: ALVEOLOPLASTY WITH RESUTURING OF ORAL WOUND;  Surgeon: [name], [name];  Location: [facility] ORS;  Service: Oral Surgery;  Laterality: N/A;  . CARDIAC CATHETERIZATION N/A 10/12/2015   Procedure: Right Heart Cath;  Surgeon: [name], MD;  Location: [place] CV LAB;  Service: Cardiovascular;  Laterality: N/A;  . CARDIOVERSION  09/24/2012   Procedure: CARDIOVERSION;  Surgeon: [name], MD;  Location[facility] ENDOSCOPY;  Service: Cardiovascular;  Laterality: N/A;  . CARDIOVERSION N/A 12/25/2017   Procedure: CARDIOVERSION;  Surgeon: [name], MD;  Location: [facility]S;  Service: Cardiovascular;  Laterality: N/A;  . COLONOSCOPY W/ POLYPECTOMY  02/13/2017  . CORONARY ARTERY BYPASS GRAFT  2011   in [place] N/A 08/12/2019   Procedure: CAUTERIZATION OF ORAL BLEEDING;  Surgeon: [name], [name];  Location: [facility];  Service: Oral Surgery;  Laterality: N/A;  . FLEXIBLE SIGMOIDOSCOPY N/A 02/20/2017   Procedure: FLEXIBLE SIGMOIDOSCOPY;  Surgeon: [name], MD;  Location: [facility] ENDOSCOPY;  Service: Endoscopy;  Laterality: N/A;  . FLEXIBLE SIGMOIDOSCOPY N/A 02/21/2017   Procedure: FLEXIBLE SIGMOIDOSCOPY;  Surgeon: [name], MD;  Location: [facility] ENDOSCOPY;  Service: Endoscopy;  Laterality: N/A;  . ICD    . ICD GENERATOR CHANGEOUT N/A 09/10/2019   Procedure: [place];  Surgeon: [name], MD;  Location: [place] CV LAB;  Service: Cardiovascular;  Laterality: N/A;  . ICD LEAD REMOVAL Right 11/13/2019   Procedure: ICD EXTRACTION and Lead Extraction;  Surgeon: [name], MD;  Location: [place];  Service: Cardiovascular;  Laterality: Right;  Dr. Servando Snare for backup  . POCKET REVISION/RELOCATION N/A 09/17/2019   Procedure: POCKET REVISION/RELOCATION;  Surgeon: Thompson Grayer, MD;  Location: [place] CV LAB;  Service: Cardiovascular;  Laterality: N/A;  . SUBMANDIBULAR GLAND EXCISION N/A 08/08/2019   Procedure: suture of oral wounds;  Surgeon: [name], [name];  Location: [facility];  Service: Oral Surgery;  Laterality: N/A;  . TEE WITHOUT CARDIOVERSION  09/24/2012   Procedure: TRANSESOPHAGEAL ECHOCARDIOGRAM (TEE);  Surgeon: [name], MD;  Location: [place];  Service: Cardiovascular;  Laterality: N/A;  dave/anesth, dl, cindy/echo   . TEE WITHOUT CARDIOVERSION N/A 11/12/2019   Procedure: TRANSESOPHAGEAL ECHOCARDIOGRAM (TEE);  Surgeon: [name], MD;  Location: [facility] ENDOSCOPY;  Service: Cardiovascular;  Laterality: N/A;  . TEE WITHOUT CARDIOVERSION N/A 11/13/2019   Procedure: TRANSESOPHAGEAL ECHOCARDIOGRAM (TEE);  Surgeon: [name], MD;  Location: [place];  Service: Cardiovascular;  Laterality: N/A;  Family History  Problem Relation Age of Onset  . Arthritis Mother   . Heart disease Mother   . Heart attack Mother   . Other Father        smoker  . Hypertension Neg Hx        unknown  . Stroke Neg Hx        unknown    Social History   Tobacco Use  . Smoking status: Former Smoker    Types: Pipe    Quit date: 09/21/2008    Years since quitting: 11.6  . Smokeless tobacco: Former Systems developer    Quit date: 09/21/2008  Substance Use Topics  . Alcohol use: No  . Drug use: No    Home Medications Prior to Admission medications   Medication Sig Start Date End Date Taking? Authorizing Provider    acetaminophen (TYLENOL) 325 MG tablet Take 650 mg by mouth every 6 (six) hours as needed for mild pain.    [provider]  amiodarone (PACERONE) 200 MG tablet Take 1 tablet (200 mg total) by mouth 2 (two) times daily. 03/31/20   [name], [name], MD  carvedilol (COREG) 3.125 MG tablet Take 1 tablet (3.125 mg total) by mouth 2 (two) times daily with a meal. 02/24/20 11/20/20  [name], MD  divalproex (DEPAKOTE ER) 500 MG 24 hr tablet TAKE 1 TABLET BY MOUTH ONCE DAILY AT NIGHT 05/04/20   [name], MD  DULoxetine (CYMBALTA) 30 MG capsule Take 1 capsule by mouth once daily 10/13/19   [name], MD  furosemide (LASIX) 40 MG tablet Take 1 tablet by mouth once daily 12/03/19   [name], MD  levETIRAcetam (KEPPRA) 1000 MG tablet Take 1 tablet (1,000 mg total) by mouth 2 (two) times daily. 05/04/20   [name], MD  phenytoin (DILANTIN) 100 MG ER capsule Take 1 capsule (100 mg total) by mouth 2 (two) times daily. 05/04/20   [name], MD  simvastatin (ZOCOR) 20 MG tablet TAKE 1 TABLET BY MOUTH ONCE DAILY AT 6PM 03/08/20   [name], MD  tamsulosin [facility]) 0.4 MG CAPS capsule Take 1 capsule by mouth once daily 02/09/20   [name], MD  traMADol (ULTRAM) 50 MG tablet Take 1 tablet (50 mg total) by mouth every 6 (six) hours as needed for moderate pain. for pain 10/29/19   [name], MD    Allergies    Patient has no known allergies.  Review of Systems   Review of Systems  Constitutional: Positive for diaphoresis. Negative for chills and fever.  Respiratory: Negative for shortness of breath.   Cardiovascular: Positive for chest pain.  Gastrointestinal: Positive for abdominal pain. Negative for diarrhea, nausea and vomiting.  Genitourinary: Positive for flank pain.  Musculoskeletal: Positive for back pain. Negative for arthralgias.  Neurological: Negative for syncope and headaches.  All other systems reviewed and are  negative.   Physical Exam Updated Vital Signs BP (!) 130/103   Pulse 74   Temp 97.6 F (36.4 C) (Oral)   Resp (!) 26   SpO2 99%   Physical Exam Vitals and nursing note reviewed.  Constitutional:      General: He is in acute distress (Yelling).     Appearance: Normal appearance. He is well-developed. He is diaphoretic. He is not ill-appearing.  HENT:     Head: Normocephalic and atraumatic.  Eyes:     General: No scleral icterus.       Right  eye: No discharge.        Left eye: No discharge.     Conjunctiva/sclera: Conjunctivae normal.     Pupils: Pupils are equal, round, and reactive to light.  Cardiovascular:     Rate and Rhythm: Normal rate and regular rhythm.  Pulmonary:     Effort: Pulmonary effort is normal. No respiratory distress.     Breath sounds: Normal breath sounds.  Chest:     Chest wall: Tenderness (bilateral anterior Ford rib tenderness) present.  Abdominal:     General: There is no distension.     Palpations: Abdomen is soft.     Tenderness: There is no abdominal tenderness.  Musculoskeletal:     Cervical back: Normal range of motion.     Comments: Pt has limited ROM of Ford extremities due to pain in the back  Skin:    General: Skin is warm.  Neurological:     Mental Status: He is alert and oriented to person, place, and time.  Psychiatric:        Mood and Affect: Mood is anxious.        Behavior: Behavior normal.     ED Results / Procedures / Treatments   Labs (all labs ordered are listed, but only abnormal results are displayed) Labs Reviewed  CBC WITH DIFFERENTIAL/PLATELET - Abnormal; Notable for the following components:      Result Value   WBC 3.5 (*)    Hemoglobin 12.6 (*)    RDW 19.6 (*)    All other components within normal limits  PROTIME-INR - Abnormal; Notable for the following components:   Prothrombin Time 16.7 (*)    INR 1.4 (*)    All other components within normal limits  APTT - Abnormal; Notable for the following  components:   aPTT 52 (*)    All other components within normal limits  I-STAT CHEM 8, ED - Abnormal; Notable for the following components:   BUN 41 (*)    Creatinine, Ser 1.40 (*)    Glucose, Bld 122 (*)    Calcium, Ion 1.00 (*)    All other components within normal limits  COMPREHENSIVE METABOLIC PANEL  URINALYSIS, ROUTINE W REFLEX MICROSCOPIC  TROPONIN I (HIGH SENSITIVITY)    EKG EKG Interpretation  Date/Time:  Friday May 13 2020 14:34:39 EDT Ventricular Rate:  76 PR Interval:    QRS Duration: 133 QT Interval:  449 QTC Calculation: 505 R Axis:   -62 Text Interpretation: Atrial fibrillation Left bundle branch block No significant change since last tracing Confirmed by Wandra Arthurs [phone]) on 05/02/2020 4:07:10 PM   Radiology DG Chest Port 1 View  Result Date: 05/01/2020 CLINICAL DATA:  [place] while walking to the bathroom with his walker, lost balance and fell into seated position, BILATERAL flank pain EXAM: PORTABLE CHEST 1 VIEW COMPARISON:  Portable exam 1517 hours compared to 01/12/2020 FINDINGS: Enlargement of cardiac silhouette post median sternotomy. Atherosclerotic calcification aorta. Mediastinal contours and pulmonary vascularity normal. LEFT basilar atelectasis without pulmonary infiltrate, pleural effusion, or pneumothorax. Bones demineralized. IMPRESSION: Enlargement of cardiac silhouette with LEFT basilar atelectasis. Electronically Signed   By: Lavonia Dana M.D.   On: 04/29/2020 15:24    Procedures Procedures (including critical care time)  Medications Ordered in ED Medications  fentaNYL (SUBLIMAZE) injection 50 mcg (has no administration in time range)  sodium chloride 0.9 % bolus 1,000 mL (1,000 mLs Intravenous New Bag/Given 04/23/2020 1551)    ED Course  I have reviewed the triage vital  signs and the nursing notes.  Pertinent labs & imaging results that were available during my care of the patient were reviewed by me and considered in my medical decision  making (see chart for details).  74 year old male with multiple medical problems presents with a fall and severe chest and low back pain.  Blood pressure is soft initially otherwise vital signs are reassuring.  On exam he is distressed and yelling in pain.  He indicates that he is primarily hurting over the bilateral ribs and Ford back.  He is having difficulty moving his Ford extremity secondary to pain.  Patient is very uncomfortable and constantly asking to be repositioned. He was moved in bed and started yelling and stated "I'm not going to make it" and became diaphoretic and lightheaded. He had an episode of syncope but then regained consciousness. Likely vasovagal. Dr. Sedonia Small was at bedside. Will discuss with family. Pt given 1L NS for soft BP.  Discussed with his wife Mardene Celeste who did not witness the fall but corroborates patient's story.  EKG is A. fib with left bundle branch block.  CT head, C-spine, chest x-ray, CT abdomen and pelvis ordered  Portable chest x-ray and x-ray of the left knee are negative.  We will add on CT of the chest with contrast and L-spine  At shift change imaging is pending.  Care signed out to Dr. Doren Custard. Anticipate admission due to pt's severe pain.   MDM Rules/Calculators/A&P                       Final Clinical Impression(s) / ED Diagnoses Final diagnoses:  Back pain  Fall, initial encounter    Rx / DC Orders ED Discharge Orders    None       Recardo Evangelist, PA-C 05/08/2020 1622    Maudie Flakes, MD 05/14/20 1250

## 2020-05-13 NOTE — ED Provider Notes (Signed)
Care of the patient was assumed from Janetta Hora PA-C at [phone]; see this 95 note for complete history of present illness, review of systems, and physical exam.  Briefly, the patient is a 74 y.o. male who presented to the ED with mechanical fall at home.  History significant for strokes, seizures, A. fib (not on blood thinners), CHF (LVEF of 30-35%, and moderately reduced RV systolic function from echo last month), CAD (s/p CABG in 2011), HTN, CKD stage III, factor VII deficiency, previous retroperitoneal bleed.  Plan at time of handoff:  -Follow-up on labs and pan scans.  He has gotten 1 L of IVF.  He has gotten fentanyl, 50 mcg for analgesia.  Continue pain control, as needed.  This patient will likely need to be admitted for pain control, even if scans are negative.  MDM/ED Course: On my initial exam, the patient was complaining of bilateral lower anterior rib/epigastric pain and midline back pain.  Pain is rated as 8/10 in severity.  He states that fentanyl helped him a little bit.  On exam, he had tenderness to anterior lower ribs as well as focal areas of T-spine and L-spine tenderness.  BLE strength is severely diminished, more so on the right.  Sensation is intact.  The patient was given additional dose of fentanyl for continued analgesia.  Upon reassessment, patient continued to endorse severe pain.  Blood pressures remained in normal range following IV fluid bolus.  Additional fentanyl was given.  Results of CT scans showed the following: -Diffuse lytic bony lesions, specifically in T1 vertebral body, posterior left second rib, posterior aspect of lower L4 vertebral body, and in T6 vertebral body. -T6 vertebral body had an acute compression fracture deformity with paraspinal soft tissue swelling. -Additional findings include left apical lung mass, anterior left chest wall intramuscular lipoma, and innumerable cysts on bilateral kidneys.  Given his acute T6 compression fracture, in  setting of lower extremity weakness, neurosurgery was consulted.  Per their recommendation, MRI studies of spine were ordered.  Urinalysis showed evidence of acute urinary infection.  Ceftriaxone was given.  Patient was admitted to hospitalist for UTI, pain control, PT, and further management, per neurosurgery guidance.  MR study showed the following:  -Large volume spinal epidural hemorrhage spanning from cervical spine throughout thoracic spine to the conus.   -Associated, widespread spinal cord mass-effect with up to severe cord compression at T4-T7.   -Possible active bleeding within dorsal epidural hematoma at T5 and T6.  -Probable pathologic fracture at L3 vertebral body with 25% height loss and no retropulsion -Associated fracture at right L3 transverse process.  Vital signs remained stable in the ED.  Given the spinal cord compression from acute epidural hemorrhage, patient was taken to the OR with neurosurgery for surgical decompression.  He was transported to the OR in stable condition.  Patient seen in conjunction with the attending physician, Shirlyn Goltz, MD, who participated in all aspects of the patient's care and was in agreement with the above plan.   Emergency Department Medication Summary: Medications  fentaNYL (SUBLIMAZE) injection 50 mcg (has no administration in time range)  sodium chloride 0.9 % bolus 1,000 mL (1,000 mLs Intravenous New Bag/Given 05/08/2020 1551)   Clinical Impression: 1. Back pain      Godfrey Pick, MD 05/14/20 0350    Drenda Freeze, MD 05/14/20 [phone]

## 2020-05-13 NOTE — ED Provider Notes (Signed)
Ultrasound ED Peripheral IV (Provider)  Date/Time: 04/27/2020 3:50 PM Performed by: Maudie Flakes, MD Authorized by: Maudie Flakes, MD   Procedure details:    Indications: multiple failed IV attempts     Skin Prep: chlorhexidine gluconate     Location: Right basilic vein.   Angiocath:  18 G   Bedside Ultrasound Guided: Yes     Patient tolerated procedure without complications: Yes     Dressing applied: Yes        Maudie Flakes, MD 05/16/2020 1550

## 2020-05-13 NOTE — H&P (Addendum)
[facility]. is an 74 y.o. male.   Chief Complaint: epidural hematoma thoracic spine, T6 compression fracture, paraplegia lower extreme HPI: Mr. Woulard presented to the ED today after taking a ground level fall landing on his buttocks. His initial exam in the ed, he was complaining of severe pain in the back, was notable for good strength. Although reading the note there was noted to be some weakness in the right lower extremity. Repeat exam showed profound weakness in the right lower extremity. MRi revealed the hematoma causing spinal compression. The CT showed a lytic lesion in November with much more extensive loss of bone on the scan today. There is Also a large amount of soft tissue in the prevertebral space which is significantly different than then November scan. In addition he has a bleeding disorder which has not been fully characterized despite multiple tests. He is in heart failure, and afib.   Past Medical History:  Diagnosis Date  . AICD (automatic cardioverter/defibrillator) present   . Atrial fibrillation   09/22/2012  . Blood loss anemia 04/18/2017   After GI bleed from colonoscopy and polypectomy  . CAD (coronary artery disease)   . Chronic systolic heart failure ([place])   . 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 GIB (gastrointestinal bleeding) 02/20/2017  . History of MRSA infection 05/2011  . HTN (hypertension)   . Hx of adenomatous colonic polyps 02/20/2017   01/2017 - 3 cm sigmoid TV adenoma and other smaller polyps - had post-polypectomy bleed Tx w/ clips Consider repeat colonoscopy 3 yrs [name], MD, [name]   . Hyperlipidemia   . implantable cardiac defibrillator-Biotronik    Device Implanted 2006; s/p gen change 03/2011 : bleeding persistent with pocket erosion and infection; explant and reimplant  06/2011  . Ischemic cardiomyopathy    EF 15 to 20% by TTE and TEE in 09/2012.  Severe LV dysfunction  . Obesity    BMI 31 in  09/2012  . Persistent atrial fibrillation with rapid ventricular response ([place]) 04/21/2014  . Retroperitoneal bleed 04/21/2019  . Seizure disorder ([place]) latest 09/30/2012  . Seizures ([place])   . Stroke [facility])     Past Surgical History:  Procedure Laterality Date  . ALVEOLOPLASTY N/A 07/26/2019   Procedure: ALVEOLOPLASTY WITH RESUTURING OF ORAL WOUND;  Surgeon: [name], [name];  Location: [facility]S;  Service: Oral Surgery;  Laterality: N/A;  . CARDIAC CATHETERIZATION N/A 10/12/2015   Procedure: Right Heart Cath;  Surgeon: [name], MD;  Location: [place] CV LAB;  Service: Cardiovascular;  Laterality: N/A;  . CARDIOVERSION  09/24/2012   Procedure: CARDIOVERSION;  Surgeon: [name], MD;  Location: [facility] ENDOSCOPY;  Service: Cardiovascular;  Laterality: N/A;  . CARDIOVERSION N/A 12/25/2017   Procedure: CARDIOVERSION;  Surgeon: [name], MD;  Location: [facility];  Service: Cardiovascular;  Laterality: N/A;  . COLONOSCOPY W/ POLYPECTOMY  02/13/2017  . CORONARY ARTERY BYPASS GRAFT  2011   in [place] N/A 08/12/2019   Procedure: CAUTERIZATION OF ORAL BLEEDING;  Surgeon: [name], [name];  [facility]: [facility];  Service: Oral Surgery;  Laterality: N/A;  . FLEXIBLE SIGMOIDOSCOPY N/A 02/20/2017   Procedure: FLEXIBLE SIGMOIDOSCOPY;  Surgeon: [name], MD;  Location: [facility] END[facility]COPY;  Service: Endoscopy;  Laterality: N/A;  . FLEXIBLE SIGMOIDOSCOPY N/A 02/21/2017   Procedure: FLEXIBLE SIGMOIDOSCOPY;  Surgeon: [name], MD;  Location: [facility] ENDOSCOPY;  Service: Endoscopy;  Laterality: N/A;  .  ICD    . ICD GENERATOR CHANGEOUT N/A 09/10/2019   Procedure: ICD GENERATOR CHANGEOUT;  Surgeon: [name], MD;  Location: [place] CV LAB;  Service: Cardiovascular;  Laterality: N/A;  . ICD LEAD REMOVAL Right 11/13/2019   Procedure: ICD EXTRACTION and Lead Extraction;  Surgeon: [name], MD;  Location: [place];  Service: Cardiovascular;  Laterality: Right;  Dr. Servando Snare for  backup  . POCKET REVISION/RELOCATION N/A 09/17/2019   Procedure: POCKET REVISION/RELOCATION;  Surgeon: Thompson Grayer, MD;  Location: [place] [facility] LAB;  Service: Cardiovascular;  Laterality: N/A;  . SUBMANDIBULAR GLAND EXCISION N/A 08/08/2019   Procedure: suture of oral wounds;  Surgeon: [name], [name];  [facility];  Service: Oral Surgery;  Laterality: N/A;  . TEE WITHOUT CARDIOVERSION  09/24/2012   Procedure: TRANSESOPHAGEAL ECHOCARDIOGRAM (TEE);  Surgeon: [name], MD;  Location: [place];  Service: Cardiovascular;  Laterality: N/A;  dave/anesth, dl, cindy/echo   . TEE WITHOUT CARDIOVERSION N/A 11/12/2019   Procedure: TRANSESOPHAGEAL ECHOCARDIOGRAM (TEE);  Surgeon: [name], MD;  Location: [facility] ENDOSCOPY;  Service: Cardiovascular;  Laterality: N/A;  . TEE WITHOUT CARDIOVERSION N/A 11/13/2019   Procedure: TRANSESOPHAGEAL ECHOCARDIOGRAM (TEE);  Surgeon: [name], MD;  Location: [facility] OR;  Service: Cardiovascular;  Laterality: N/A;    Family History  Problem Relation Age of Onset  . Arthritis Mother   . Heart disease Mother   . Heart attack Mother   . Other Father        smoker  . Hypertension Neg Hx        unknown  . Stroke Neg Hx        unknown   Social History:  reports that he quit smoking about 11 years ago. His smoking use included pipe. He quit smokeless tobacco use about 11 years ago. He reports that he does not drink alcohol or use drugs.  Allergies: No Known Allergies  (Not in a hospital admission)   Results for orders placed or performed during the hospital encounter of 05/12/2020 (from the past 48 hour(s))  CBC with Differential     Status: Abnormal   Collection Time: 05/05/2020  3:31 PM  Result Value Ref Range   WBC 3.5 (L) 4.0 - 10.5 K/uL   RBC 4.54 4.22 - 5.81 MIL/uL   Hemoglobin 12.6 (L) 13.0 - 17.0 g/dL   HCT 41.5 39.0 - 52.0 %   MCV 91.4 80.0 - 100.0 fL   MCH 27.8 26.0 - 34.0 pg   MCHC 30.4 30.0 - 36.0 g/dL   RDW 19.6 (H) 11.5 -  15.5 %   Platelets 191 150 - 400 K/uL   nRBC 0.0 0.0 - 0.2 %   Neutrophils Relative % 75 %   Neutro Abs 2.6 1.7 - 7.7 K/uL   Lymphocytes Relative 18 %   Lymphs Abs 0.6 (L) 0.7 - 4.0 K/uL   Monocytes Relative 6 %   Monocytes Absolute 0.2 0.1 - 1.0 K/uL   Eosinophils Relative 1 %   Eosinophils Absolute 0.0 0.0 - 0.5 K/uL   Basophils Relative 0 %   Basophils Absolute 0.0 0.0 - 0.1 K/uL   nRBC 0 0 /100 WBC   Abs Immature Granulocytes 0.00 0.00 - 0.07 K/uL   Polychromasia PRESENT    Target Cells PRESENT     Comment: Performed at [facility], [address], [place], [place] [address]  Protime-INR     Status: Abnormal   Collection Time: 05/09/2020  3:31  PM  Result Value Ref Range   Prothrombin Time 16.7 (H) 11.4 - 15.2 seconds   INR 1.4 (H) 0.8 - 1.2    Comment: (NOTE) INR goal varies based on device and disease states. Performed at [facility], [place] [address] [place], [place] [address]   APTT     Status: Abnormal   Collection Time: 05/14/2020  3:31 PM  Result Value Ref Range   aPTT 52 (H) 24 - 36 seconds    Comment:        IF BASELINE aPTT IS ELEVATED, SUGGEST PATIENT RISK ASSESSMENT BE USED TO DETERMINE APPROPRIATE ANTICOAGULANT THERAPY. Performed at [facility], [place] [address] [place], [place] [address]   Comprehensive metabolic panel     Status: Abnormal   Collection Time: 04/25/2020  3:31 PM  Result Value Ref Range   Sodium 135 135 - 145 mmol/L   Potassium 4.0 3.5 - 5.1 mmol/L   Chloride 104 98 - 111 mmol/L   CO2 23 22 - 32 mmol/L   Glucose, Bld 129 (H) 70 - 99 mg/dL    Comment: Glucose reference range applies only to samples taken after fasting for at least 8 hours.   BUN 36 (H) 8 - 23 mg/dL   Creatinine, Ser 1.51 (H) 0.61 - 1.24 mg/dL   Calcium 8.4 (L) 8.9 - 10.3 mg/dL   Total Protein 10.8 (H) 6.5 - 8.1 g/dL   Albumin 2.9 (L) 3.5 - 5.0 g/dL   AST 36 15 - 41 U/L   ALT 28 0 - 44 U/L   Alkaline Phosphatase 99 38 - 126 U/L   Total Bilirubin 1.0  0.3 - 1.2 mg/dL   GFR calc non Af Amer 46 (L) >60 mL/min   GFR calc Af Amer 53 (L) >60 mL/min   Anion gap 8 5 - 15    Comment: Performed at [facility] [address] [place], [place] [address]  I-stat chem 8, ED     Status: Abnormal   Collection Time: 05/06/2020  3:51 PM  Result Value Ref Range   Sodium 139 135 - 145 mmol/L   Potassium 3.6 3.5 - 5.1 mmol/L   Chloride 104 98 - 111 mmol/L   BUN 41 (H) 8 - 23 mg/dL   Creatinine, Ser 1.40 (H) 0.61 - 1.24 mg/dL   Glucose, Bld 122 (H) 70 - 99 mg/dL    Comment: Glucose reference range applies only to samples taken after fasting for at least 8 hours.   Calcium, Ion 1.00 (L) 1.15 - 1.40 mmol/L   TCO2 25 22 - 32 mmol/L   Hemoglobin 15.6 13.0 - 17.0 g/dL   HCT 46.0 39.0 - 52.0 %  Urinalysis, Routine w reflex microscopic     Status: Abnormal   Collection Time: 05/16/2020  6:55 PM  Result Value Ref Range   Color, Urine YELLOW YELLOW   APPearance HAZY (A) CLEAR   Specific Gravity, Urine 1.015 1.005 - 1.030   pH 5.0 5.0 - 8.0   Glucose, UA NEGATIVE NEGATIVE mg/dL   Hgb urine dipstick MODERATE (A) NEGATIVE   Bilirubin Urine NEGATIVE NEGATIVE   Ketones, ur NEGATIVE NEGATIVE mg/dL   Protein, ur NEGATIVE NEGATIVE mg/dL   Nitrite POSITIVE (A) NEGATIVE   Leukocytes,Ua LARGE (A) NEGATIVE   RBC / HPF 11-20 0 - 5 RBC/hpf   WBC, UA >50 (H) 0 - 5 WBC/hpf   Bacteria, UA MANY (A) NONE SEEN   Squamous Epithelial / LPF 0-5  0 - 5   Mucus PRESENT     Comment: Performed at [facility], [place] [address], [place], [place] [address]  SARS Coronavirus 2 by RT PCR (hospital order, performed in [facility] hospital lab) Nasopharyngeal Nasopharyngeal Swab     Status: None   Collection Time: 05/05/2020  7:30 PM   Specimen: Nasopharyngeal Swab  Result Value Ref Range   SARS Coronavirus 2 NEGATIVE NEGATIVE    Comment: (NOTE) SARS-CoV-2 target nucleic acids are NOT DETECTED. The SARS-CoV-2 RNA is generally detectable in upper and lower respiratory  specimens during the acute phase of infection. The lowest concentration of SARS-CoV-2 viral copies this assay can detect is 250 copies / mL. A negative result does not preclude SARS-CoV-2 infection and should not be used as the sole basis for treatment or other patient management decisions.  A negative result may occur with improper specimen collection / handling, submission of specimen other than nasopharyngeal swab, presence of viral mutation(s) within the areas targeted by this assay, and inadequate number of viral copies (<250 copies / mL). A negative result must be combined with clinical observations, patient history, and epidemiological information. Fact Sheet for Patients:   StrictlyIdeas.no Fact Sheet for Healthcare Providers: BankingDealers.co.za This test is not yet approved or cleared  by the [place] FDA and has been authorized for detection and/or diagnosis of SARS-CoV-2 by FDA under an Emergency Use Authorization (EUA).  This EUA will remain in effect (meaning this test can be used) for the duration of the COVID-19 declaration under Section 564(b)(1) of the Act, 21 U.S.C. section 360bbb-3(b)(1), unless the authorization is terminated or revoked sooner. Performed at [facility], [place] [address] [place], [place] [address]    CT HEAD WO CONTRAST  Result Date: 05/09/2020 CLINICAL DATA:  Fall, back pain, history of retroperitoneal bleeding. EXAM: CT HEAD WITHOUT CONTRAST CT CERVICAL SPINE WITHOUT CONTRAST TECHNIQUE: Multidetector CT imaging of the head and cervical spine was performed following the standard protocol without intravenous contrast. Multiplanar CT image reconstructions of the cervical spine were also generated. COMPARISON:  Prior head CT examinations 07/20/2019 and earlier, chest CT 10/29/2019. 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 Stable ill-defined hypoattenuation within the cerebral white matter which is nonspecific, but consistent with chronic small vessel ischemic disease. Stable, mild generalized parenchymal atrophy. There is no acute intracranial hemorrhage. No acute demarcated cortical infarct. No extra-axial fluid collection. No evidence of intracranial mass. No midline shift. Vascular: No hyperdense vessel.  Atherosclerotic calcifications. Skull: Normal. Negative for fracture or focal lesion. Sinuses/Orbits: Visualized orbits show no acute finding. Mild ethmoid sinus mucosal thickening. No significant mastoid effusion. CT CERVICAL SPINE FINDINGS Alignment: Straightening of the expected cervical lordosis. Minimal C7-T1 anterolisthesis. Skull base and vertebrae: The basion-dental and atlanto-dental intervals are maintained.No evidence of acute fracture to the cervical spine. Soft tissues and spinal canal: No prevertebral fluid or swelling. No visible canal hematoma. Disc levels: Cervical spondylosis with multilevel disc space narrowing, posterior disc osteophytes, uncovertebral and facet hypertrophy. Disc space narrowing is advanced at C3-C4 and C4-C5. Degenerative fusion of the C5-C6 vertebral bodies. Upper chest: Reported separately. No consolidation within the imaged lung apices. No visible pneumothorax. Prior median sternotomy. Indeterminate 9 mm well-circumscribed lucent focus within the posterior T1 vertebral body. Incompletely imaged irregular lucency within the posterior  left second rib. IMPRESSION: CT head: 1. No evidence of acute intracranial abnormality. 2. 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 3. Stable mild generalized parenchymal atrophy and chronic small vessel ischemic disease.  4. Mild ethmoid sinus mucosal thickening. Cervical spine: 1. No evidence of acute fracture to the cervical spine. 2. Indeterminate 9 mm well-circumscribed lucent lesion within the posterior T1 vertebral body. Additionally, there is incompletely imaged irregular lucency within the posterior left second rib. Please refer to separately reported CT chest. 3. Cervical spondylosis as described. Degenerative C5-C6 fusion. 4. Minimal C7-T1 grade 1 anterolisthesis. Electronically Signed   By: Kellie Simmering DO   On: 05/12/2020 18:43   CT Chest W Contrast  Result Date: 05/05/2020 CLINICAL DATA:  Status post fall. EXAM: CT CHEST, ABDOMEN, AND PELVIS WITH CONTRAST TECHNIQUE: Multidetector CT imaging of the chest, abdomen and pelvis was performed following the standard protocol during bolus administration of intravenous contrast. CONTRAST:  168mL OMNIPAQUE IOHEXOL 300 MG/ML  SOLN COMPARISON:  None. FINDINGS: CT CHEST FINDINGS Cardiovascular: There is moderate severity calcification of the aortic arch. There is moderate severity cardiomegaly. No pericardial effusion. Mediastinum/Nodes: No enlarged mediastinal, hilar, or axillary lymph nodes. Thyroid gland, trachea, and esophagus demonstrate no significant findings. Lungs/Pleura: A 3.7 cm x 1.5 cm low-attenuation mass is seen along the posterior aspect of the left apex. An adjacent expansile lytic area is seen within the posterior aspect of the second left rib (axial CT image 15, CT series number 3). Moderate severity atelectasis and/or infiltrate is seen within the left lower lobe. There is no evidence of a pleural effusion or pneumothorax. Musculoskeletal: Multiple sternal wires are seen. A 4.3 cm x 2.2 cm intramuscular lipoma is seen along the anterior left chest wall. Degenerative changes are seen throughout the thoracic spine. CT ABDOMEN PELVIS FINDINGS Hepatobiliary: No focal liver abnormality is seen. No gallstones, gallbladder wall thickening, or biliary dilatation.  Pancreas: Unremarkable. No pancreatic ductal dilatation or surrounding inflammatory changes. Spleen: Normal in size without focal abnormality. Adrenals/Urinary Tract: Adrenal glands are unremarkable. Innumerable cysts of various sizes are seen within both kidneys. There is no evidence of renal calculi or hydronephrosis. Bladder is unremarkable. Stomach/Bowel: Stomach is within normal limits. Appendix appears normal. No evidence of bowel wall thickening, distention, or inflammatory changes. Vascular/Lymphatic: There is moderate severity calcification and atherosclerosis throughout the abdominal aorta. No enlarged abdominal or pelvic lymph nodes. Reproductive: The prostate gland is mildly enlarged. Other: No abdominal wall hernia or abnormality. No abdominopelvic ascites. Musculoskeletal: Multilevel degenerative changes are seen throughout the lumbar spine. IMPRESSION: 1. 3.7 cm x 1.5 cm low-attenuation mass along the posterior aspect of the left apex with an adjacent expansile lytic area seen within the posterior aspect of the second left rib. 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 2. Moderate severity atelectasis and/or infiltrate within the left lower lobe. 3. Innumerable cysts of various sizes within both kidneys consistent with polycystic kidney disease. 4. 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 5. Mild enlargement of the prostate gland. 6. Aortic atherosclerosis. Aortic Atherosclerosis (ICD10-I70.0). Electronically Signed   By: Virgina Norfolk M.D.   On: 05/22/2020 18:34   CT CERVICAL SPINE WO CONTRAST  Result Date: 05/04/2020 CLINICAL DATA:  Fall, back pain, history of retroperitoneal bleeding. EXAM: CT HEAD WITHOUT CONTRAST CT CERVICAL SPINE WITHOUT CONTRAST TECHNIQUE: Multidetector CT imaging of the head and cervical spine was  performed following the standard protocol without intravenous contrast. Multiplanar  CT image reconstructions of the cervical spine were also generated. COMPARISON:  Prior head CT examinations 07/20/2019 and earlier, chest CT 10/29/2019. 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 Stable ill-defined hypoattenuation within the cerebral white matter which is nonspecific, but consistent with chronic small vessel ischemic disease. Stable, mild generalized parenchymal atrophy. There is no acute intracranial hemorrhage. No acute demarcated cortical infarct. No extra-axial fluid collection. No evidence of intracranial mass. No midline shift. Vascular: No hyperdense vessel.  Atherosclerotic calcifications. Skull: Normal. Negative for fracture or focal lesion. Sinuses/Orbits: Visualized orbits show no acute finding. Mild ethmoid sinus mucosal thickening. No significant mastoid effusion. CT CERVICAL SPINE FINDINGS Alignment: Straightening of the expected cervical lordosis. Minimal C7-T1 anterolisthesis. Skull base and vertebrae: The basion-dental and atlanto-dental intervals are maintained.No evidence of acute fracture to the cervical spine. Soft tissues and spinal canal: No prevertebral fluid or swelling. No visible canal hematoma. Disc levels: Cervical spondylosis with multilevel disc space narrowing, posterior disc osteophytes, uncovertebral and facet hypertrophy. Disc space narrowing is advanced at C3-C4 and C4-C5. Degenerative fusion of the C5-C6 vertebral bodies. Upper chest: Reported separately. No consolidation within the imaged lung apices. No visible pneumothorax. Prior median sternotomy. Indeterminate 9 mm well-circumscribed lucent focus within the posterior T1 vertebral body. Incompletely imaged irregular lucency within the posterior left second rib. IMPRESSION: CT  head: 1. No evidence of acute intracranial abnormality. 2. 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 3. Stable mild generalized parenchymal atrophy and chronic small vessel ischemic disease. 4. Mild ethmoid sinus mucosal thickening. Cervical spine: 1. No evidence of acute fracture to the cervical spine. 2. Indeterminate 9 mm well-circumscribed lucent lesion within the posterior T1 vertebral body. Additionally, there is incompletely imaged irregular lucency within the posterior left second rib. Please refer to separately reported CT chest. 3. Cervical spondylosis as described. Degenerative C5-C6 fusion. 4. Minimal C7-T1 grade 1 anterolisthesis. Electronically Signed   By: Kellie Simmering DO   On: 05/02/2020 18:43   MR THORACIC SPINE W WO CONTRAST  Addendum Date: 04/26/2020   ADDENDUM REPORT: 05/08/2020 22:19 ADDENDUM: Critical Value/emergent results were called by telephone at the time of interpretation on 04/27/2020 at 2210 hours to Dr. Shirlyn Goltz , who verbally acknowledged these results. Furthermore, after reading the Lumbar MRI on this patient, it is possible this large amount of intraspinal hematoma is in the subdural space in addition to (or rather than) the epidural space. Electronically Signed   By: Genevie Ann M.D.   On: 05/17/2020 22:19   Result Date: 05/20/2020 CLINICAL DATA:  74 year old male status post fall with T6 vertebral fracture and abnormal appearance of the thoracic spinal canal suggesting cord compression on CT. Lytic left 2nd and 5th posterior ribs. EXAM: MRI THORACIC WITHOUT AND WITH CONTRAST TECHNIQUE: Multiplanar and multiecho pulse sequences of the thoracic spine were obtained without and with intravenous contrast. CONTRAST:  8.61mL GADAVIST GADOBUTROL 1 MMOL/ML IV SOLN COMPARISON:  CT Chest, Abdomen, and Pelvis, thoracic and lumbar spine today reported separately. FINDINGS: Limited cervical spine imaging: Suspected  abnormal epidural material tracking cephalad into the cervical spine from the thoracic spine, probably to at least the C6 level. Thoracic spine segmentation:  Normal. Alignment: Stable from the earlier CTs. No significant  spondylolisthesis. Vertebrae: T6 vertebral body compression with 35% loss of vertebral body height and diffusely abnormal, decreased precontrast T1 marrow signal throughout the body, but also the right T6 pedicle and lamina. Associated intense enhancement and STIR hyperintensity. Mild retropulsion of bone or abnormal soft tissue. There are also decreased T1, STIR hyperintense, and enhancing lesions in the left T8 transverse process, T8 spinous process, right posterior T12 vertebral body, as well as the left 3rd, 5th, and 6th posterior ribs. No dispense Lea the bulky soft tissue destroying and extending out of the left 3rd rib (series 19, image 6). No other thoracic pathologic fracture identified. Cord: Diffusely abnormal spinal canal where there appears to be a large volume of T1 and T2 hyperintense, partially GRE hypointense, hemorrhage (series 16, image 9) tracking from the level of the conus throughout the thoracic spine and into the lower cervical spine. There is widespread associated spinal cord mass effect which is mild at the cervicothoracic junction (series 18, image 1) but severe at the T4-T5 through T6-T7 levels (series 18, images 15 through 18). And dorsal to the abnormal T6 vertebra the epidural blood appears most heterogeneous and also demonstrates enhancement following contrast (series 22, image 8). See also series 18, image 18 and series 23, image 18. No superimposed dural thickening or enhancement. No abnormal spinal cord signal despite the compression. No abnormal intra-axial enhancement of the cord. Paraspinal and other soft tissues: There is also bulky abnormal prevertebral material in the mid and upper thoracic spine which is heterogeneous on T2 and GRE sequences, T1 hypointense  and seems not to be enhancing. Burtis Junes this is also hemorrhage. Superimposed pleural fluid or hemothorax. There is possibly a small area of soft tissue injury superficial to the T5 and T6 spinous processes. But otherwise the dorsal paraspinal soft tissues are within normal limits. Partially visible polycystic renal disease in the upper abdomen. Disc levels: No age advanced degenerative changes. IMPRESSION: 1. 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 2. Subsequent widespread spinal cord mass effect, with up to severe cord compression T4-T5 through T6-T7. No cord signal abnormality identified, although there could be active bleeding (enhancement) within the dorsal epidural hematoma at T5 and T6. 3. Similar bulky prevertebral hematoma also suspected in the midthoracic spine. And small pleural effusions or pleural blood. 4. See also Lumbar MRI reported separately. Electronically Signed: By: Genevie Ann M.D. On: 05/21/2020 22:05   MR Lumbar Spine W Wo Contrast  Result Date: 04/26/2020 CLINICAL DATA:  74 year old male status post fall with T6 vertebral fracture and abnormal appearance of the thoracic spinal canal suggesting cord compression on CT. Lytic left 2nd and 5th posterior ribs. EXAM: MRI LUMBAR SPINE WITHOUT AND WITH CONTRAST TECHNIQUE: Multiplanar and multiecho pulse sequences of the lumbar spine were obtained without and with intravenous contrast. CONTRAST:  8.41mL GADAVIST GADOBUTROL 1 MMOL/ML IV SOLN COMPARISON:  Thoracic spine MRI today reported separately along with CT Chest, Abdomen, and Pelvis today. FINDINGS: Segmentation: Normal, concordant with the thoracic spine numbering today. Alignment: Mild straightening of lumbar lordosis. Subtle retrolisthesis of L1 on L2. subtle anterolisthesis of L4 on L5. Vertebrae: Enhancing  bone lesions in the right posterior T12 and inferior L4 vertebral bodies. Smaller enhancing lesions in the left L5 pedicle, left S3 vertebra. Compression and heterogeneous enhancement and edema throughout the L3 vertebral body. Central loss of height up  to 25%. No retropulsed bone. However, there is an associated right transverse process fracture of L3 with marrow edema, no enhancement. But preserved normal marrow signal in the other L3 posterior elements. Visible SI joints appear intact. There is an expansile bone lesion of the right iliac wing on series 29, image 34. Conus medullaris and cauda equina: Only faint abnormal epidural or subdural space signal along the visible lower thoracic spinal cord and conus (series 23, image 9 and series 26, image 5. This does not continue below the T12-L1 level. Conus extends to the T12-L1 level. No lower spinal cord or conus signal abnormality. Cauda equina nerve roots appear fairly normal. No abnormal intradural enhancement in the lumbar spine. Paraspinal and other soft tissues: Polycystic renal disease. Lumbar paraspinal soft tissues are within normal limits. Disc levels: Mostly mild for age lumbar spine degeneration. There is moderate to severe facet hypertrophy in the lower lumbar spine. No degenerative spinal stenosis. IMPRESSION: 1. 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 2. Probable pathologic fracture of the L3 vertebral body. 25% loss of height with no retropulsion. Associated fracture of the right L3 transverse process. 3. The abnormal epidural (versus subdural) Intraspinal Hemorrhage seen in the thoracic spine tapers at the level of the conus, T12-L1. No conus signal abnormality and normal cauda equina nerve roots. 4. Ordinary lower lumbar spine degeneration, but no degenerative spinal stenosis. Electronically Signed   By: Genevie Ann M.D.   On: 04/23/2020 22:16   CT ABDOMEN PELVIS W  CONTRAST  Result Date: 05/05/2020 CLINICAL DATA:  Status post fall with subsequent back pain. EXAM: CT CHEST, ABDOMEN, AND PELVIS WITH CONTRAST TECHNIQUE: Multidetector CT imaging of the chest, abdomen and pelvis was performed following the standard protocol during bolus administration of intravenous contrast. CONTRAST:  120mL OMNIPAQUE IOHEXOL 300 MG/ML  SOLN COMPARISON:  None. FINDINGS: CT CHEST FINDINGS Cardiovascular: There is moderate severity calcification of the aortic arch. There is moderate severity cardiomegaly. No pericardial effusion. Mediastinum/Nodes: No enlarged mediastinal, hilar, or axillary lymph nodes. Thyroid gland, trachea, and esophagus demonstrate no significant findings. Lungs/Pleura: A 3.7 cm x 1.5 cm low-attenuation mass is seen along the posterior aspect of the left apex. An adjacent expansile lytic area is seen within the posterior aspect of the second left rib (axial CT image 15, CT series number 3). Moderate severity atelectasis and/or infiltrate is seen within the left lower lobe. There is no evidence of a pleural effusion or pneumothorax. Musculoskeletal: Multiple sternal wires are seen. A 4.3 cm x 2.2 cm intramuscular lipoma is seen along the anterior left chest wall. Degenerative changes are seen throughout the thoracic spine. CT ABDOMEN PELVIS FINDINGS Hepatobiliary: No focal liver abnormality is seen. No gallstones, gallbladder wall thickening, or biliary dilatation. Pancreas: Unremarkable. No pancreatic ductal dilatation or surrounding inflammatory changes. Spleen: Normal in size without focal abnormality. Adrenals/Urinary Tract: Adrenal glands are unremarkable. Innumerable cysts of various sizes are seen within both kidneys. There is no evidence of renal calculi or hydronephrosis. Bladder is unremarkable. Stomach/Bowel: Stomach is within normal limits. Appendix appears normal. No evidence of bowel wall thickening, distention, or inflammatory changes. Vascular/Lymphatic: There  is moderate severity calcification and atherosclerosis throughout the abdominal aorta. No enlarged abdominal or pelvic lymph nodes. Reproductive: The prostate gland is mildly enlarged. Other: No abdominal wall hernia or abnormality. No abdominopelvic ascites. Musculoskeletal: Multilevel degenerative changes are seen throughout the lumbar spine. IMPRESSION: 1. 3.7 cm x 1.5 cm low-attenuation mass along the  posterior aspect of the left apex with an adjacent expansile lytic area seen within the posterior aspect of the second left rib. 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 2. Moderate severity atelectasis and/or infiltrate within the left lower lobe. 3. Innumerable cysts of various sizes within both kidneys consistent with polycystic kidney disease. 4. 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 5. Mild enlargement of the prostate gland. 6. Aortic atherosclerosis. Aortic Atherosclerosis (ICD10-I70.0). Electronically Signed   By: Virgina Norfolk M.D.   On: 05/03/2020 18:33   CT T-SPINE NO CHARGE  Result Date: 04/23/2020 CLINICAL DATA:  Status post fall. EXAM: CT THORACIC SPINE WITHOUT CONTRAST TECHNIQUE: Multidetector CT images of the thoracic were obtained using the standard protocol without intravenous contrast. COMPARISON:  None. FINDINGS: Alignment: Normal. Vertebrae: A compression fracture deformity is seen involving the T6 vertebral body. This is of indeterminate age. An adjacent 2.3 cm x 2.2 cm lytic area is seen within this region. Mild paraspinal soft tissue swelling is noted. Paraspinal and other soft tissues: A 3.9 cm x 1.5 cm heterogeneous low-attenuation soft tissue mass is seen along the posterior aspect of the left apex. The adjacent portion of the posterior second left rib is expansile and lytic in appearance. Disc levels: Mild multilevel endplate sclerosis is seen with mild intervertebral disc space narrowing  is seen. IMPRESSION: 1. T6 vertebral body compression fracture deformity with an associated 2.3 cm x 2.2 cm lytic area and paraspinal soft tissue swelling. Further evaluation with MRI is recommended. 2. 3.9 cm x 1.5 cm heterogeneous low-attenuation soft tissue mass along the posterior aspect of the left apex with an expansile and lytic appearing posterior second left rib. This may represent a primary bronchogenic carcinoma. Further evaluation with a nuclear medicine PET/CT scan is recommended. Electronically Signed   By: Virgina Norfolk M.D.   On: 05/21/2020 18:39   CT L-SPINE NO CHARGE  Result Date: 05/05/2020 CLINICAL DATA:  Status post fall. EXAM: CT LUMBAR SPINE WITHOUT CONTRAST TECHNIQUE: Multidetector CT imaging of the lumbar spine was performed without intravenous contrast administration. Multiplanar CT image reconstructions were also generated. COMPARISON:  None. FINDINGS: Segmentation: 5 lumbar type vertebrae. Alignment: Normal. Vertebrae: No acute fracture. A 1.6 cm x 0.9 cm well-defined lytic area is seen within the posterior aspect of the lower L4 vertebral body (sagittal reformatted image 150, CT series number 1). Paraspinal and other soft tissues: Negative. Disc levels: Mild to moderate severity multilevel endplate sclerosis is seen with mild multilevel intervertebral disc space narrowing IMPRESSION: 1. No acute fracture within the lumbar spine. 2. Mild to moderate severity multilevel degenerative changes. 3. 1.6 cm x 0.9 cm well-defined lytic area within the posterior aspect of the lower L4 vertebral body. This may represent a small hemangioma. MRI correlation is recommended. Electronically Signed   By: Virgina Norfolk M.D.   On: 05/19/2020 18:41   DG Chest Port 1 View  Result Date: 05/23/2020 CLINICAL DATA:  [place] while walking to the bathroom with his walker, lost balance and fell into seated position, BILATERAL flank pain EXAM: PORTABLE CHEST 1 VIEW COMPARISON:  Portable exam 1517 hours  compared to 01/12/2020 FINDINGS: Enlargement of cardiac silhouette post median sternotomy. Atherosclerotic calcification aorta. Mediastinal contours and pulmonary vascularity normal. LEFT basilar atelectasis without pulmonary infiltrate, pleural effusion, or pneumothorax. Bones demineralized. IMPRESSION: Enlargement of cardiac silhouette with LEFT basilar atelectasis. Electronically Signed   By: Crist Infante.D.  On: 04/30/2020 15:24   DG Knee Complete 4 Views Left  Result Date: 05/11/2020 CLINICAL DATA:  Status post trauma. EXAM: LEFT KNEE - COMPLETE 4+ VIEW COMPARISON:  None. FINDINGS: No evidence of fracture, dislocation, or joint effusion. Moderate severity narrowing of the medial and lateral tibiofemoral compartment spaces is seen. There is marked severity vascular calcification. IMPRESSION: Moderate severity degenerative changes without evidence of acute osseous abnormality. Electronically Signed   By: Virgina Norfolk M.D.   On: 05/09/2020 16:11    Review of Systems  Constitutional: Negative.   HENT: Negative.   Eyes: Negative.   Respiratory: Negative.   Genitourinary: Negative.   Musculoskeletal: Positive for back pain.  Neurological: Headaches: hematuria.  Hematological: Bruises/bleeds easily.  Psychiatric/Behavioral:       Dementia    Blood pressure (!) 150/116, pulse 64, temperature (!) 97.1 F (36.2 C), temperature source Axillary, resp. rate 20, SpO2 100 %. Physical Exam  Constitutional: He appears well-developed and well-nourished.  HENT:  Head: Normocephalic and atraumatic.  Eyes: Pupils are equal, round, and reactive to light. Conjunctivae are normal.  Respiratory: Effort normal and breath sounds normal.  GI: Soft. Bowel sounds are normal.  Musculoskeletal:     Cervical back: Normal range of motion and neck supple.  Neurological: He is alert. No cranial nerve deficit or sensory deficit. GCS eye subscore is 4. GCS verbal subscore is 5. GCS motor subscore is 6. He  displays no Babinski's sign on the right side. He displays no Babinski's sign on the left side.  1/5 strength right lower extremity Proprioception intact No detailed sensory exam available 3/5 strength in left lower extremity gAit not assessed.  Skin: Skin is warm and dry.  Psychiatric: He has a normal mood and affect. His behavior is normal. Judgment and thought content normal.     Assessment/Plan OR for emergent decompression via a thoracic laminectomy. 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 I have spoken with Dr. Jana Hakim of heme/onc and will try to mitigate the bleeding dyscrasia with platelet transfusions and tran  [name], MD 05/12/2020, 11:32 PM

## 2020-05-13 NOTE — ED Triage Notes (Signed)
Pt bib ems with hx of dementia who was walking to the bathroom with his walker, lost his balance and fell into seated position. No LOC, no blood thinners. Pt was assisted into the bed by family. Pt c/o bilateral flank pain. No tenderness, no deformity. Pain occurs with ROM. VSS with EMS.

## 2020-05-14 ENCOUNTER — Other Ambulatory Visit: Payer: Self-pay

## 2020-05-14 ENCOUNTER — Inpatient Hospital Stay (HOSPITAL_COMMUNITY): Payer: Medicare Other | Admitting: Registered Nurse

## 2020-05-14 ENCOUNTER — Inpatient Hospital Stay (HOSPITAL_COMMUNITY): Payer: Medicare Other

## 2020-05-14 ENCOUNTER — Encounter (HOSPITAL_COMMUNITY): Payer: Self-pay | Admitting: Neurosurgery

## 2020-05-14 DIAGNOSIS — D689 Coagulation defect, unspecified: Secondary | ICD-10-CM

## 2020-05-14 DIAGNOSIS — C9000 Multiple myeloma not having achieved remission: Secondary | ICD-10-CM | POA: Diagnosis present

## 2020-05-14 DIAGNOSIS — D62 Acute posthemorrhagic anemia: Secondary | ICD-10-CM

## 2020-05-14 DIAGNOSIS — M549 Dorsalgia, unspecified: Secondary | ICD-10-CM

## 2020-05-14 DIAGNOSIS — R911 Solitary pulmonary nodule: Secondary | ICD-10-CM

## 2020-05-14 DIAGNOSIS — S24159A Other incomplete lesion at unspecified level of thoracic spinal cord, initial encounter: Secondary | ICD-10-CM

## 2020-05-14 DIAGNOSIS — M8448XA Pathological fracture, other site, initial encounter for fracture: Secondary | ICD-10-CM

## 2020-05-14 DIAGNOSIS — S064X9A Epidural hemorrhage with loss of consciousness of unspecified duration, initial encounter: Secondary | ICD-10-CM

## 2020-05-14 DIAGNOSIS — R710 Precipitous drop in hematocrit: Secondary | ICD-10-CM

## 2020-05-14 DIAGNOSIS — W19XXXA Unspecified fall, initial encounter: Secondary | ICD-10-CM

## 2020-05-14 LAB — POCT I-STAT 7, (LYTES, BLD GAS, ICA,H+H)
Acid-Base Excess: 0 mmol/L (ref 0.0–2.0)
Acid-base deficit: 1 mmol/L (ref 0.0–2.0)
Acid-base deficit: 1 mmol/L (ref 0.0–2.0)
Bicarbonate: 25 mmol/L (ref 20.0–28.0)
Bicarbonate: 23.3 mmol/L (ref 20.0–28.0)
Bicarbonate: 21.2 mmol/L (ref 20.0–28.0)
Calcium, Ion: 0.95 mmol/L — ABNORMAL LOW (ref 1.15–1.40)
Calcium, Ion: 1.28 mmol/L (ref 1.15–1.40)
Calcium, Ion: 1.19 mmol/L (ref 1.15–1.40)
HCT: 38 % — ABNORMAL LOW (ref 39.0–52.0)
HCT: 35 % — ABNORMAL LOW (ref 39.0–52.0)
HCT: 35 % — ABNORMAL LOW (ref 39.0–52.0)
Hemoglobin: 12.9 g/dL — ABNORMAL LOW (ref 13.0–17.0)
Hemoglobin: 11.9 g/dL — ABNORMAL LOW (ref 13.0–17.0)
Hemoglobin: 11.9 g/dL — ABNORMAL LOW (ref 13.0–17.0)
O2 Saturation: 100 %
O2 Saturation: 100 %
O2 Saturation: 100 %
Patient temperature: 34.6
Patient temperature: 34.3
Patient temperature: 33.5
Potassium: 3.9 mmol/L (ref 3.5–5.1)
Potassium: 3.8 mmol/L (ref 3.5–5.1)
Potassium: 3.6 mmol/L (ref 3.5–5.1)
Sodium: 141 mmol/L (ref 135–145)
Sodium: 140 mmol/L (ref 135–145)
Sodium: 141 mmol/L (ref 135–145)
TCO2: 26 mmol/L (ref 22–32)
TCO2: 24 mmol/L (ref 22–32)
TCO2: 22 mmol/L (ref 22–32)
pCO2 arterial: 40.1 mmHg (ref 32.0–48.0)
pCO2 arterial: 31.8 mmHg — ABNORMAL LOW (ref 32.0–48.0)
pCO2 arterial: 21.8 mmHg — ABNORMAL LOW (ref 32.0–48.0)
pH, Arterial: 7.391 (ref 7.350–7.450)
pH, Arterial: 7.463 — ABNORMAL HIGH (ref 7.350–7.450)
pH, Arterial: 7.584 — ABNORMAL HIGH (ref 7.350–7.450)
pO2, Arterial: 592 mmHg — ABNORMAL HIGH (ref 83.0–108.0)
pO2, Arterial: 295 mmHg — ABNORMAL HIGH (ref 83.0–108.0)
pO2, Arterial: 154 mmHg — ABNORMAL HIGH (ref 83.0–108.0)

## 2020-05-14 LAB — PREPARE RBC (CROSSMATCH)

## 2020-05-14 LAB — CBC
HCT: 30.5 % — ABNORMAL LOW (ref 39.0–52.0)
HCT: 30.5 % — ABNORMAL LOW (ref 39.0–52.0)
Hemoglobin: 9.7 g/dL — ABNORMAL LOW (ref 13.0–17.0)
Hemoglobin: 9.2 g/dL — ABNORMAL LOW (ref 13.0–17.0)
MCH: 28.4 pg (ref 26.0–34.0)
MCH: 27.7 pg (ref 26.0–34.0)
MCHC: 31.8 g/dL (ref 30.0–36.0)
MCHC: 30.2 g/dL (ref 30.0–36.0)
MCV: 89.2 fL (ref 80.0–100.0)
MCV: 91.9 fL (ref 80.0–100.0)
Platelets: 203 10*3/uL (ref 150–400)
Platelets: 191 10*3/uL (ref 150–400)
RBC: 3.42 MIL/uL — ABNORMAL LOW (ref 4.22–5.81)
RBC: 3.32 MIL/uL — ABNORMAL LOW (ref 4.22–5.81)
RDW: 19.2 % — ABNORMAL HIGH (ref 11.5–15.5)
RDW: 19.5 % — ABNORMAL HIGH (ref 11.5–15.5)
WBC: 5.2 10*3/uL (ref 4.0–10.5)
WBC: 6.7 10*3/uL (ref 4.0–10.5)
nRBC: 0 % (ref 0.0–0.2)
nRBC: 0 % (ref 0.0–0.2)

## 2020-05-14 LAB — PROTIME-INR
INR: 1.3 — ABNORMAL HIGH (ref 0.8–1.2)
Prothrombin Time: 15.3 seconds — ABNORMAL HIGH (ref 11.4–15.2)

## 2020-05-14 LAB — FIBRINOGEN: Fibrinogen: 291 mg/dL (ref 210–475)

## 2020-05-14 LAB — GLUCOSE, CAPILLARY
Glucose-Capillary: 88 mg/dL (ref 70–99)
Glucose-Capillary: 89 mg/dL (ref 70–99)
Glucose-Capillary: 62 mg/dL — ABNORMAL LOW (ref 70–99)
Glucose-Capillary: 113 mg/dL — ABNORMAL HIGH (ref 70–99)
Glucose-Capillary: 74 mg/dL (ref 70–99)
Glucose-Capillary: 104 mg/dL — ABNORMAL HIGH (ref 70–99)

## 2020-05-14 LAB — HEMOGLOBIN A1C
Hgb A1c MFr Bld: 6.1 % — ABNORMAL HIGH (ref 4.8–5.6)
Mean Plasma Glucose: 128.37 mg/dL

## 2020-05-14 LAB — MRSA PCR SCREENING: MRSA by PCR: NEGATIVE

## 2020-05-14 LAB — MAGNESIUM: Magnesium: 1.5 mg/dL — ABNORMAL LOW (ref 1.7–2.4)

## 2020-05-14 LAB — TRIGLYCERIDES: Triglycerides: 114 mg/dL (ref <150)

## 2020-05-14 LAB — APTT: aPTT: 54 seconds — ABNORMAL HIGH (ref 24–36)

## 2020-05-14 LAB — PHOSPHORUS: Phosphorus: 4.1 mg/dL (ref 2.5–4.6)

## 2020-05-14 MED ORDER — SUCCINYLCHOLINE CHLORIDE 200 MG/10ML IV SOSY
PREFILLED_SYRINGE | INTRAVENOUS | Status: DC | PRN
  Administered 2020-05-14: 120 mg via INTRAVENOUS

## 2020-05-14 MED ORDER — DEXTROSE 50 % IV SOLN
INTRAVENOUS | Status: AC
  Filled 2020-05-14: qty 50

## 2020-05-14 MED ORDER — PROPOFOL 10 MG/ML IV BOLUS
INTRAVENOUS | Status: DC | PRN
  Administered 2020-05-14: 125 mg via INTRAVENOUS

## 2020-05-14 MED ORDER — TAMSULOSIN HCL 0.4 MG PO CAPS
0.4000 mg | ORAL_CAPSULE | Freq: Every day | ORAL | Status: DC
  Administered 2020-05-14 – 2020-05-16 (×3): 0.4 mg via ORAL
  Filled 2020-05-14 (×3): qty 1

## 2020-05-14 MED ORDER — THROMBIN 5000 UNITS EX SOLR
OROMUCOSAL | Status: DC | PRN
  Administered 2020-05-14: 5 mL via TOPICAL

## 2020-05-14 MED ORDER — PANTOPRAZOLE SODIUM 40 MG IV SOLR: 40.0000 mg | Freq: Every day | INTRAVENOUS | Status: DC

## 2020-05-14 MED ORDER — ONDANSETRON HCL 4 MG/2ML IJ SOLN
4.0000 mg | Freq: Four times a day (QID) | INTRAMUSCULAR | Status: DC | PRN
  Administered 2020-05-14: 4 mg via INTRAVENOUS
  Filled 2020-05-14: qty 2

## 2020-05-14 MED ORDER — OXYCODONE HCL 5 MG PO TABS: 5.0000 mg | ORAL_TABLET | ORAL | Status: DC | PRN

## 2020-05-14 MED ORDER — SODIUM CHLORIDE 0.9% FLUSH
3.0000 mL | Freq: Two times a day (BID) | INTRAVENOUS | Status: DC
  Administered 2020-05-14 – 2020-05-16 (×6): 3 mL via INTRAVENOUS

## 2020-05-14 MED ORDER — LACTATED RINGERS IV SOLN: INTRAVENOUS | Status: DC | PRN

## 2020-05-14 MED ORDER — PHENYLEPHRINE HCL (PRESSORS) 10 MG/ML IV SOLN
INTRAVENOUS | Status: AC
  Filled 2020-05-14: qty 1

## 2020-05-14 MED ORDER — THROMBIN 20000 UNITS EX SOLR
CUTANEOUS | Status: AC
  Filled 2020-05-14: qty 20000

## 2020-05-14 MED ORDER — CEFAZOLIN SODIUM-DEXTROSE 2-3 GM-%(50ML) IV SOLR
INTRAVENOUS | Status: DC | PRN
Start: 2020-05-14 — End: 2020-05-14
  Administered 2020-05-14: 2 g via INTRAVENOUS

## 2020-05-14 MED ORDER — PHENYTOIN SODIUM EXTENDED 100 MG PO CAPS
100.0000 mg | ORAL_CAPSULE | Freq: Two times a day (BID) | ORAL | Status: DC
  Administered 2020-05-14 – 2020-05-16 (×5): 100 mg via ORAL
  Filled 2020-05-14 (×6): qty 1

## 2020-05-14 MED ORDER — PROPOFOL 1000 MG/100ML IV EMUL
5.0000 ug/kg/min | INTRAVENOUS | Status: DC
  Administered 2020-05-14: 20 ug/kg/min via INTRAVENOUS
  Filled 2020-05-14: qty 100

## 2020-05-14 MED ORDER — ORAL CARE MOUTH RINSE
15.0000 mL | OROMUCOSAL | Status: DC
  Administered 2020-05-14 (×6): 15 mL via OROMUCOSAL

## 2020-05-14 MED ORDER — ORAL CARE MOUTH RINSE: 15.0000 mL | Freq: Two times a day (BID) | OROMUCOSAL | Status: DC

## 2020-05-14 MED ORDER — PHENYLEPHRINE 40 MCG/ML (10ML) SYRINGE FOR IV PUSH (FOR BLOOD PRESSURE SUPPORT)
PREFILLED_SYRINGE | INTRAVENOUS | Status: AC
  Filled 2020-05-14: qty 10

## 2020-05-14 MED ORDER — THROMBIN 5000 UNITS EX SOLR
CUTANEOUS | Status: AC
  Filled 2020-05-14: qty 5000

## 2020-05-14 MED ORDER — FUROSEMIDE 40 MG PO TABS
40.0000 mg | ORAL_TABLET | Freq: Every day | ORAL | Status: DC
  Administered 2020-05-14 – 2020-05-16 (×3): 40 mg via ORAL
  Filled 2020-05-14 (×3): qty 1

## 2020-05-14 MED ORDER — MENTHOL 3 MG MT LOZG
1.0000 | LOZENGE | OROMUCOSAL | Status: DC | PRN
  Filled 2020-05-14: qty 9

## 2020-05-14 MED ORDER — SODIUM CHLORIDE 0.9 % IV SOLN: INTRAVENOUS | Status: DC | PRN

## 2020-05-14 MED ORDER — CHLORHEXIDINE GLUCONATE 0.12% ORAL RINSE (MEDLINE KIT)
15.0000 mL | Freq: Two times a day (BID) | OROMUCOSAL | Status: DC
  Administered 2020-05-14: 15 mL via OROMUCOSAL

## 2020-05-14 MED ORDER — SODIUM CHLORIDE 0.9% FLUSH: 3.0000 mL | INTRAVENOUS | Status: DC | PRN

## 2020-05-14 MED ORDER — ACETAMINOPHEN 325 MG PO TABS: 650.0000 mg | ORAL_TABLET | ORAL | Status: DC | PRN

## 2020-05-14 MED ORDER — SODIUM CHLORIDE 0.9 % IV SOLN: 250.0000 mL | INTRAVENOUS | Status: DC

## 2020-05-14 MED ORDER — BACITRACIN ZINC 500 UNIT/GM EX OINT
TOPICAL_OINTMENT | CUTANEOUS | Status: AC
  Filled 2020-05-14: qty 28.35

## 2020-05-14 MED ORDER — OXYCODONE HCL 5 MG PO TABS
10.0000 mg | ORAL_TABLET | ORAL | Status: DC | PRN
  Administered 2020-05-14 – 2020-05-16 (×5): 10 mg via ORAL
  Filled 2020-05-14 (×5): qty 2

## 2020-05-14 MED ORDER — 0.9 % SODIUM CHLORIDE (POUR BTL) OPTIME
TOPICAL | Status: DC | PRN
  Administered 2020-05-14: 1000 mL

## 2020-05-14 MED ORDER — PHENYLEPHRINE 40 MCG/ML (10ML) SYRINGE FOR IV PUSH (FOR BLOOD PRESSURE SUPPORT)
PREFILLED_SYRINGE | INTRAVENOUS | Status: DC | PRN
  Administered 2020-05-14 (×6): 120 ug via INTRAVENOUS

## 2020-05-14 MED ORDER — BACITRACIN ZINC 500 UNIT/GM EX OINT
TOPICAL_OINTMENT | CUTANEOUS | Status: DC | PRN
  Administered 2020-05-14: 1 via TOPICAL

## 2020-05-14 MED ORDER — ALBUMIN HUMAN 5 % IV SOLN: INTRAVENOUS | Status: DC | PRN

## 2020-05-14 MED ORDER — THROMBIN 20000 UNITS EX SOLR
CUTANEOUS | Status: DC | PRN
  Administered 2020-05-14: 20 mL via TOPICAL

## 2020-05-14 MED ORDER — DULOXETINE HCL 30 MG PO CPEP
30.0000 mg | ORAL_CAPSULE | Freq: Every day | ORAL | Status: DC
  Administered 2020-05-14 – 2020-05-16 (×3): 30 mg via ORAL
  Filled 2020-05-14 (×5): qty 1

## 2020-05-14 MED ORDER — LIDOCAINE 2% (20 MG/ML) 5 ML SYRINGE
INTRAMUSCULAR | Status: DC | PRN
  Administered 2020-05-14: 40 mg via INTRAVENOUS

## 2020-05-14 MED ORDER — AMIODARONE HCL 200 MG PO TABS
200.0000 mg | ORAL_TABLET | Freq: Two times a day (BID) | ORAL | Status: DC
  Administered 2020-05-14 – 2020-05-16 (×5): 200 mg via ORAL
  Filled 2020-05-14 (×5): qty 1

## 2020-05-14 MED ORDER — POTASSIUM CHLORIDE IN NACL 20-0.9 MEQ/L-% IV SOLN
INTRAVENOUS | Status: DC
  Filled 2020-05-14 (×5): qty 1000

## 2020-05-14 MED ORDER — PHENYLEPHRINE HCL-NACL 10-0.9 MG/250ML-% IV SOLN: 0.0000 ug/min | INTRAVENOUS | Status: DC

## 2020-05-14 MED ORDER — PHENYLEPHRINE HCL-NACL 10-0.9 MG/250ML-% IV SOLN
INTRAVENOUS | Status: DC | PRN
  Administered 2020-05-14: 20 ug/min via INTRAVENOUS

## 2020-05-14 MED ORDER — ROCURONIUM BROMIDE 10 MG/ML (PF) SYRINGE
PREFILLED_SYRINGE | INTRAVENOUS | Status: AC
  Filled 2020-05-14: qty 10

## 2020-05-14 MED ORDER — LEVETIRACETAM 500 MG PO TABS
1000.0000 mg | ORAL_TABLET | Freq: Two times a day (BID) | ORAL | Status: DC
  Administered 2020-05-14 – 2020-05-16 (×5): 1000 mg via ORAL
  Filled 2020-05-14 (×5): qty 2

## 2020-05-14 MED ORDER — FENTANYL CITRATE (PF) 250 MCG/5ML IJ SOLN
INTRAMUSCULAR | Status: DC | PRN
  Administered 2020-05-14: 100 ug via INTRAVENOUS
  Administered 2020-05-14: 50 ug via INTRAVENOUS

## 2020-05-14 MED ORDER — CHLORHEXIDINE GLUCONATE CLOTH 2 % EX PADS: 6.0000 | MEDICATED_PAD | Freq: Every day | CUTANEOUS | Status: DC

## 2020-05-14 MED ORDER — CARVEDILOL 3.125 MG PO TABS
3.1250 mg | ORAL_TABLET | Freq: Two times a day (BID) | ORAL | Status: DC
  Filled 2020-05-14 (×4): qty 1

## 2020-05-14 MED ORDER — ROCURONIUM BROMIDE 10 MG/ML (PF) SYRINGE
PREFILLED_SYRINGE | INTRAVENOUS | Status: DC | PRN
  Administered 2020-05-14: 20 mg via INTRAVENOUS
  Administered 2020-05-14: 80 mg via INTRAVENOUS

## 2020-05-14 MED ORDER — ONDANSETRON HCL 4 MG PO TABS: 4.0000 mg | ORAL_TABLET | Freq: Four times a day (QID) | ORAL | Status: DC | PRN

## 2020-05-14 MED ORDER — SUCCINYLCHOLINE CHLORIDE 200 MG/10ML IV SOSY
PREFILLED_SYRINGE | INTRAVENOUS | Status: AC
  Filled 2020-05-14: qty 10

## 2020-05-14 MED ORDER — EPHEDRINE 5 MG/ML INJ
INTRAVENOUS | Status: AC
  Filled 2020-05-14: qty 10

## 2020-05-14 MED ORDER — PANTOPRAZOLE SODIUM 40 MG PO TBEC
40.0000 mg | DELAYED_RELEASE_TABLET | Freq: Every day | ORAL | Status: DC
  Administered 2020-05-14 – 2020-05-15 (×2): 40 mg via ORAL
  Filled 2020-05-14 (×2): qty 1

## 2020-05-14 MED ORDER — TRAMADOL HCL 50 MG PO TABS
50.0000 mg | ORAL_TABLET | Freq: Four times a day (QID) | ORAL | Status: DC | PRN
  Administered 2020-05-14 – 2020-05-15 (×2): 50 mg via ORAL
  Filled 2020-05-14 (×2): qty 1

## 2020-05-14 MED ORDER — LIDOCAINE 2% (20 MG/ML) 5 ML SYRINGE
INTRAMUSCULAR | Status: AC
  Filled 2020-05-14: qty 5

## 2020-05-14 MED ORDER — PROPOFOL 500 MG/50ML IV EMUL
INTRAVENOUS | Status: DC | PRN
  Administered 2020-05-14: 25 ug/kg/min via INTRAVENOUS

## 2020-05-14 MED ORDER — ETOMIDATE 2 MG/ML IV SOLN
INTRAVENOUS | Status: AC
  Filled 2020-05-14: qty 10

## 2020-05-14 MED ORDER — PHENOL 1.4 % MT LIQD: 1.0000 | OROMUCOSAL | Status: DC | PRN

## 2020-05-14 MED ORDER — SIMVASTATIN 20 MG PO TABS
20.0000 mg | ORAL_TABLET | Freq: Every day | ORAL | Status: DC
  Administered 2020-05-14 – 2020-05-16 (×3): 20 mg via ORAL
  Filled 2020-05-14 (×3): qty 1

## 2020-05-14 MED ORDER — DIAZEPAM 5 MG PO TABS
5.0000 mg | ORAL_TABLET | Freq: Four times a day (QID) | ORAL | Status: DC | PRN
  Administered 2020-05-15 (×2): 5 mg via ORAL
  Filled 2020-05-14 (×2): qty 1

## 2020-05-14 MED ORDER — CALCIUM CHLORIDE 10 % IV SOLN
INTRAVENOUS | Status: DC | PRN
  Administered 2020-05-14 (×2): 200 mg via INTRAVENOUS
  Administered 2020-05-14: 400 mg via INTRAVENOUS
  Administered 2020-05-14: 200 mg via INTRAVENOUS

## 2020-05-14 MED ORDER — DIVALPROEX SODIUM ER 500 MG PO TB24
500.0000 mg | ORAL_TABLET | Freq: Every day | ORAL | Status: DC
  Administered 2020-05-14 – 2020-05-16 (×3): 500 mg via ORAL
  Filled 2020-05-14 (×5): qty 1

## 2020-05-14 MED ORDER — LIDOCAINE-EPINEPHRINE 0.5 %-1:200000 IJ SOLN
INTRAMUSCULAR | Status: AC
  Filled 2020-05-14: qty 1

## 2020-05-14 MED ORDER — EPHEDRINE SULFATE-NACL 50-0.9 MG/10ML-% IV SOSY
PREFILLED_SYRINGE | INTRAVENOUS | Status: DC | PRN
  Administered 2020-05-14 (×5): 10 mg via INTRAVENOUS

## 2020-05-14 MED ORDER — MORPHINE SULFATE (PF) 2 MG/ML IV SOLN
2.0000 mg | INTRAVENOUS | Status: DC | PRN
  Administered 2020-05-14 – 2020-05-16 (×9): 2 mg via INTRAVENOUS
  Filled 2020-05-14 (×9): qty 1

## 2020-05-14 MED ORDER — INSULIN ASPART 100 UNIT/ML ~~LOC~~ SOLN
0.0000 [IU] | SUBCUTANEOUS | Status: DC
  Administered 2020-05-16: 1 [IU] via SUBCUTANEOUS
  Administered 2020-05-17 (×2): 3 [IU] via SUBCUTANEOUS
  Administered 2020-05-17 – 2020-05-18 (×2): 1 [IU] via SUBCUTANEOUS
  Administered 2020-05-20: 2 [IU] via SUBCUTANEOUS
  Administered 2020-05-20: 1 [IU] via SUBCUTANEOUS
  Administered 2020-05-20 – 2020-05-21 (×2): 2 [IU] via SUBCUTANEOUS
  Administered 2020-05-21 (×2): 1 [IU] via SUBCUTANEOUS
  Administered 2020-05-21: 2 [IU] via SUBCUTANEOUS
  Administered 2020-05-21: 1 [IU] via SUBCUTANEOUS
  Administered 2020-05-21: 2 [IU] via SUBCUTANEOUS
  Administered 2020-05-22: 1 [IU] via SUBCUTANEOUS
  Administered 2020-05-22: 4 [IU] via SUBCUTANEOUS
  Administered 2020-05-22 (×2): 1 [IU] via SUBCUTANEOUS
  Administered 2020-05-22: 3 [IU] via SUBCUTANEOUS
  Administered 2020-05-22: 1 [IU] via SUBCUTANEOUS
  Administered 2020-05-22 – 2020-05-23 (×3): 2 [IU] via SUBCUTANEOUS
  Administered 2020-05-23: 3 [IU] via SUBCUTANEOUS
  Administered 2020-05-23 – 2020-05-24 (×4): 2 [IU] via SUBCUTANEOUS
  Administered 2020-05-24: 3 [IU] via SUBCUTANEOUS
  Administered 2020-05-24: 2 [IU] via SUBCUTANEOUS
  Administered 2020-05-24: 3 [IU] via SUBCUTANEOUS
  Administered 2020-05-24 – 2020-05-25 (×3): 2 [IU] via SUBCUTANEOUS
  Administered 2020-05-25: 1 [IU] via SUBCUTANEOUS
  Administered 2020-05-25: 2 [IU] via SUBCUTANEOUS
  Administered 2020-05-25 – 2020-05-26 (×2): 1 [IU] via SUBCUTANEOUS
  Administered 2020-05-27: 2 [IU] via SUBCUTANEOUS
  Administered 2020-05-27 (×2): 3 [IU] via SUBCUTANEOUS
  Administered 2020-05-28 (×2): 2 [IU] via SUBCUTANEOUS
  Administered 2020-05-28: 1 [IU] via SUBCUTANEOUS
  Administered 2020-05-28 (×2): 2 [IU] via SUBCUTANEOUS
  Administered 2020-05-28: 1 [IU] via SUBCUTANEOUS
  Administered 2020-05-28 – 2020-05-29 (×3): 2 [IU] via SUBCUTANEOUS
  Administered 2020-05-29 – 2020-05-30 (×4): 1 [IU] via SUBCUTANEOUS
  Administered 2020-05-31 (×3): 2 [IU] via SUBCUTANEOUS
  Administered 2020-06-01: 1 [IU] via SUBCUTANEOUS
  Administered 2020-06-01: 2 [IU] via SUBCUTANEOUS
  Administered 2020-06-01 – 2020-06-02 (×2): 1 [IU] via SUBCUTANEOUS
  Administered 2020-06-02 (×3): 2 [IU] via SUBCUTANEOUS

## 2020-05-14 MED ORDER — CHLORHEXIDINE GLUCONATE CLOTH 2 % EX PADS
6.0000 | MEDICATED_PAD | Freq: Every day | CUTANEOUS | Status: DC
  Administered 2020-05-16 – 2020-06-01 (×17): 6 via TOPICAL

## 2020-05-14 MED ORDER — ACETAMINOPHEN 650 MG RE SUPP: 650.0000 mg | RECTAL | Status: DC | PRN

## 2020-05-14 NOTE — Progress Notes (Signed)
Per Dr. Christella Noa, [facility] restricted to 20 degrees

## 2020-05-14 NOTE — Transfer of Care (Signed)
Immediate Anesthesia Transfer of Care Note  Patient: [facility].  Procedure(s) Performed: THORACIC LAMINECTOMY FOR HEMATOMA EVACUATION (N/A Back)  Patient Location: ICU  Anesthesia Type:General  Level of Consciousness: Patient remains intubated per anesthesia plan  Airway & Oxygen Therapy: Patient remains intubated per anesthesia plan and Patient placed on Ventilator (see vital sign flow sheet for setting) - RT at bedside, applied to ventilator, PRVC - 100% FiO2, 520 Vt, 16 RR, 5 PEEP, bilateral breath sounds present.  Post-op Assessment: Report given to RN and Post -op Vital signs reviewed and stable  Post vital signs: Reviewed and stable  Last Vitals:  Vitals Value Taken Time  BP 115/79 ABP   Temp    Pulse 74   Resp 21 05/14/20 0312  SpO2 99   Vitals shown include unvalidated device data.  Last Pain:  Vitals:   05/14/20 0036  TempSrc: Oral     Report to Elta Guadeloupe RN in 4N ICU per Linna Caprice MD and this CRNA. Platelets currently infusing slowly per hematology recommendations, TXA also infusing per [facility] orders per hematology, propofol infusing for sedation, neo gtt currently off, VSS, full report given. Transfer of patient care in stable and safe condition.     Complications: No apparent anesthesia complications

## 2020-05-14 NOTE — Op Note (Signed)
05/14/2020  4:00 AM  PATIENT:  Louis Ford.  74 y.o. male  PRE-OPERATIVE DIAGNOSIS:  EPIDURAL HEMATOMA , thoracic  POST-OPERATIVE DIAGNOSIS:  EPIDURAL HEMATOMA , thoracic Spinal extradural tumor PROCEDURE:  Procedure(s):Stage 1 THORACIC LAMINECTOMY FOR HEMATOMA EVACUATION  SURGEON: Surgeon(s): [name], MD  ASSISTANTS:none  ANESTHESIA:   general  EBL:  Total I/O In: 3920 [I.V.:1700; Blood:1310; IV Piggyback:910] Out: 1500 [Urine:1000; Blood:500]  BLOOD ADMINISTERED:2 units FFP and 2  PLTS  CELL SAVER GIVEN:none  COUNT:per nursing  DRAINS: (1) Hemovact drain(s) in the subfascial space with  Suction Open   SPECIMEN:  Source of Specimen:  thoracic spine  DICTATION: [facility]. was taken to the operating room, intubated, and placed under a general anesthetic without difficulty. He had a foley catheter placed under sterile conditions. He was positioned prone on a jackson table with all pressure points properly padded. His back was prepped and draped in a sterile manner. I injected lidocaine in the planned incision. I opened the skin with a 10 blade, and carried it through the subcutaneous, and fascial planes. I exposed the T6, T5, and T7 lamina. I performed a laminectomy centered at T6. I removed the ligamentum flavum and revealed a large amount of clotted blood. I evacuated the epidural hematoma and the cord was pulsatile. I followed abnormal bone laterally on the right side and removed what was epidural tumor. Specimens were sent to pathology.  I achieved hemostasis then closed approximating the thoracolumbar, and subcutaneous planes with vicryl sutures. The skin was approximated with staples. A sterile dressing was applied.  This is part 1 of a staged procedure. He has a pathologic fracture at T6, and will need to be stabilized with hardware and arthrodesed. He also has a history of a poorly characterized bleeding disorder. It was my judgement to decompress the canal,  and fuse him at a later time.    PLAN OF CARE: Admit to inpatient   PATIENT DISPOSITION:  PACU - hemodynamically stable.   Delay start of Pharmacological VTE agent (>24hrs) due to surgical blood loss or risk of bleeding:  yes

## 2020-05-14 NOTE — Progress Notes (Signed)
Anesthesiology note:  As noted Broughton Silos is a 74 year old male with a history of severe bleeding following surgical procedures.  Previous work-up at [facility] failed to determine a definitive diagnosis for his coagulopathy.  Tonight he presented with a large intraspinal hematoma and cord compression and underwent emergency thoracic decompressive laminectomy by Dr. Christella Noa. He was felt to have a myeloma lesion at T6.  Estimated blood loss was approximately 500 cc, but there was not excessive bleeding  during surgery per Dr. Christella Noa.  Prior to incision he was given 3 units of FFP,  one 8 donor of platelets, and loaded with one gram tranxeminic acid.  A thromboelastogram (TEG) done following administration of the FFP, platelets, and TXA showed an R time of 11.5 seconds with normal MA and normal functional fibrinogen. These results suggest a  reduced ability to initiate clot formation but normal clot strength with normal platelet and fibrinogen function.  Plan to infuse an additional 8 donor pack of platelets over the next 6 hours post-op and continue TXA infusion. Plan to give  NovoSeven 20 units/kg and repeat every 4 hours as recommended by Dr. Jana Hakim should the patient have persistent bleeding from his surgical drain.  Roberts Gaudy

## 2020-05-14 NOTE — Consult Note (Signed)
NAME:  Louis Ford., MRN:  [id number], DOB:  1946-07-15, LOS: 1 ADMISSION DATE:  05/23/2020, CONSULTATIO care unit N DATE:  05/14/20 REFERRING MD:  Neurosurgery, CHIEF COMPLAINT:   Fall  Brief History   74 year old male who presented to the emergency department 5/21 after a fall while walking to the bathroom was found to have pathologic compression fracture of T6 and large volume spinal epidural hemorrhage requiring hematoma evacuation and thoracic laminectomy.  He was intubated for procedure and transferred to the intensive care unit postop.  History of present illness   74 year old male with a history of hypertension, hyperlipidemia, CAD s/p CABG 2011, ischemic cardiomyopathy HFrEF, AICD, atrial fibrillation, CVA, seizure disorder, factor VII deficiency, not clearly diagnosed bleeding diathesis despite extensive work-up who was walking to the bathroom with his walker when he lost his balance and fell to seated position after his legs just gave out on him.  He was having significant pain on presentation to the ED and was found to have pathologic compression fracture of T6  large volume of Spinal Epidural Hemorrhage spanning from the cervical spine throughout the thoracic spine to the conus.  Widespread spinal cord mass-effect.  Neurosurgery and hematology evaluated, recommendation for 3 units FFP, 1 unit of platelets and 1 g of TXA  preprocedure, 1 unit platelets, 1 g TXA post procedure with additional FFP depending on volume status.  Neurosurgery performed T6 laminectomy and epidural clot evacuation.  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   Micro Data:  5/21 MRSA screen>> negative 5/21 SARS-Cov-2>> negative 5/21 urine culture>>   Antimicrobials:  Ceftriaxone 5/21-  Interim history/subjective:  Patient is awake and following commands.  Signaling discomfort from endotracheal tube  Objective   Blood pressure (!) 119/93, pulse (!) 57, temperature (!) 97.3 F (36.3 C), resp. rate 19, SpO2 100 %.    Vent Mode: CPAP;PSV FiO2 (%):  [40 %] 40 % Set Rate:  [14 bmp-16 bmp] 14 bmp Vt Set:  [600 mL] 600 mL PEEP:  [5 cmH20] 5 cmH20 Pressure Support:  [5 cmH20] 5 cmH20 Plateau Pressure:  [15 cmH20] 15 cmH20   Intake/Output Summary  (Last  24 hours) at 05/14/2020 1122 Last data filed at 05/14/2020 1100 Gross per 24 hour  Intake 5577.99 ml  Output 2765 ml  Net 2812.99 ml   There were no vitals filed for this visit.  General: Elderly male, intubated and sedated HEENT: MM pink/moist, ET tube in place Neuro: Pupils 2 mm equal and reactive to light, breathing over the vent, 4/5 strength upper extremities.  2/5 lower extremities. CV: s1s2 RRR, no m/r/g PULM: CTA B.  Tolerating pressure support ventilation. GI: soft, bsx4 active  Extremities: warm/dry, no edema  Skin: no rashes or lesions   Resolved Hospital Problem list     Assessment & Plan:   Critically ill due to respiratory failure requiring mechanical ventilation. Now awake and hemodynamically stable.  Passed SBT. -Extubation. -Pulmonary toilet.   Fall with pathologic T6 fracture and epidural hematoma causing cord compression Status post T6 laminectomy  -Management per neurosurgery   Bleeding diathesis, without definitive diagnosis, epidural tumor noted intraoperatively.  Received factor transfusions intraoperatively. -Extensive work-up by Dr. Irene Limbo and at [facility] by [name] -Work-up for possible multiple myeloma per hematology -pathology pending -Consider TXA infusion if requires further OR -Empirically transfuse FFP. -Consider tag to better define clotting disorder.   History of seizure disorder Sound like both tonic-clonic, followed by neurology  HFrEF, CAD s/p CABG, AICD, atrial fibrillation, hypertension, hyperlipidemia Last echocardiogram in 03/2020 with EF of 30 to 35%, not chronically anti-coagulated given bleeding disorder -Resume Coreg, amiodarone once extubated. -Continue Lasix, monitor volume status and I&O's   Best practice:  Diet: Swallow screen and advance diet Pain/Anxiety/Delirium protocol (if indicated): As needed oxycodone only VAP protocol (if indicated): HOB 30 degrees, daily SBT DVT prophylaxis: SCDs.  No heparin given bleeding  diathesis GI prophylaxis: Protonix Glucose control: SSI Mobility: Bedrest Code Status: Full code Family Communication: Per primary Disposition: ICU  Labs   CBC: Recent Labs  Lab 04/29/2020 1531 05/22/2020 1531 05/23/2020 1551 05/14/20 0114 05/14/20 0159 05/14/20 0336 05/14/20 0654  WBC 3.5*  --   --   --   --   --  5.2  NEUTROABS 2.6  --   --   --   --   --   --   HGB 12.6*   < > 15.6 12.9* 11.9* 11.9* 9.7*  HCT 41.5   < > 46.0 38.0* 35.0* 35.0* 30.5*  MCV 91.4  --   --   --   --   --  89.2  PLT 191  --   --   --   --   --  203   < > = values in this interval not displayed.    Basic Metabolic Panel: Recent Labs  Lab 05/03/2020 1531 05/06/2020 1551 05/14/20 0114 05/14/20 0159 05/14/20 0336 05/14/20 0654  NA 135 139 141 140 141  --   K 4.0 3.6 3.9 3.8 3.6  --   CL 104 104  --   --   --   --   CO2 23  --   --   --   --   --   GLUCOSE 129* 122*  --   --   --   --   BUN 36* 41*  --   --   --   --   CREATININE 1.51* 1.40*  --   --   --   --   CALCIUM 8.4*  --   --   --   --   --   MG  --   --   --   --   --  1.5*  PHOS  --   --   --   --   --  4.1   GFR: Estimated Creatinine Clearance: 51.5 mL/min (A) (by C-G formula based on SCr of 1.4 mg/dL (H)). Recent Labs  Lab 05/17/2020 1531 05/14/20 0654  WBC 3.5* 5.2    Liver Function Tests: Recent Labs  Lab 05/18/2020 1531  AST 36  ALT 28  ALKPHOS 99  BILITOT 1.0  PROT 10.8*  ALBUMIN 2.9*   No results for input(s): LIPASE, AMYLASE in the last 168 hours. No results for input(s): AMMONIA in the last 168 hours.  ABG    Component Value Date/Time   PHART 7.584 (H) 05/14/2020 0336   PCO2ART 21.8 (L) 05/14/2020 0336   PO2ART 154 (H) 05/14/2020 0336   HCO3 21.2 05/14/2020 0336   TCO2 22 05/14/2020 0336   ACIDBASEDEF 1.0 05/14/2020 0159   O2SAT 100.0 05/14/2020 0336     Coagulation Profile: Recent Labs  Lab 04/29/2020 1531 05/14/20 0654  INR 1.4* 1.3*    Cardiac Enzymes: No results for input(s): CKTOTAL, CKMB,  CKMBINDEX, TROPONINI in the last 168 hours.  HbA1C: Hgb A1c MFr Bld  Date/Time Value Ref Range Status  05/14/2020 06:54 AM 6.1 (H) 4.8 - 5.6 % Final    Comment:    (NOTE) Pre diabetes:          5.7%-6.4% Diabetes:              >6.4% Glycemic control for   <7.0% adults with diabetes   07/25/2019 08:22 PM 5.2 4.8 - 5.6 % Final    Comment:    (NOTE) Pre diabetes:          5.7%-6.4% Diabetes:              >6.4% Glycemic control for   <7.0% adults with diabetes     CBG: Recent Labs  Lab 05/14/20 0752 05/14/20 1119  GLUCAP 88 89   CRITICAL CARE Performed by: Kipp Brood   Total critical care time: 40 minutes  Critical care time was exclusive of separately billable procedures and treating other patients.  Critical care was necessary to treat or prevent imminent or life-threatening deterioration.  Critical care was time spent personally by me on the following activities: development of treatment plan with patient and/or surrogate as well as nursing, discussions with consultants, evaluation of patient's response to treatment, examination of patient, obtaining history from patient or surrogate, ordering and performing treatments and interventions, ordering and review of laboratory studies, ordering and review of radiographic studies, pulse oximetry and re-evaluation of patient's condition.   Kipp Brood, MD

## 2020-05-14 NOTE — Progress Notes (Signed)
Patient ID: Louis Ford., male   DOB: [birth year], 74 y.o.   MRN: [id number] BP 100/86   Pulse (!) 104   Temp 97.7 F (36.5 C)   Resp 20   SpO2 100%  Alert, just extubated.  Flicker in his toes on the right Wound is clean, dry, no signs of infection.  Await pathology

## 2020-05-14 NOTE — Anesthesia Procedure Notes (Signed)
Procedure Name: Intubation Date/Time: 05/14/2020 12:35 AM Performed by: Jearld Pies, CRNA Pre-anesthesia Checklist: Patient identified, Emergency Drugs available, Suction available and Patient being monitored Patient Re-evaluated:Patient Re-evaluated prior to induction Oxygen Delivery Method: Circle System Utilized Preoxygenation: Pre-oxygenation with 100% oxygen Induction Type: IV induction Ventilation: Oral airway inserted - appropriate to patient size Laryngoscope Size: Sabra Heck and 2 Grade View: Grade I Tube type: Oral Tube size: 8.0 mm Number of attempts: 1 Airway Equipment and Method: Stylet and Oral airway Placement Confirmation: ETT inserted through vocal cords under direct vision,  positive ETCO2 and breath sounds checked- equal and bilateral Secured at: 23 cm Tube secured with: Tape Dental Injury: Teeth and Oropharynx as per pre-operative assessment

## 2020-05-14 NOTE — Consult Note (Addendum)
NAME:  Louis Dennin., MRN:  [id number], DOB:  July 01, 1946, LOS: 1 ADMISSION DATE:  05/03/2020, CONSULTATIO care unit N DATE:  05/14/20 REFERRING MD:  Neurosurgery, CHIEF COMPLAINT:   Fall  Brief History   74 year old male who presented to the emergency department 5/21 after a fall while walking to the bathroom was found to have pathologic compression fracture of T6 and large volume spinal epidural hemorrhage requiring hematoma evacuation and thoracic laminectomy.  He was intubated for procedure and transferred to the intensive care unit postop.  History of present illness   74 year old male with a history of hypertension, hyperlipidemia, CAD s/p CABG 2011, ischemic cardiomyopathy HFrEF, AICD, atrial fibrillation, CVA, seizure disorder, factor VII deficiency, not clearly diagnosed bleeding diathesis despite extensive work-up who was walking to the bathroom with his walker when he lost his balance and fell to seated position after his legs just gave out on him.  He was having significant pain on presentation to the ED and was found to have pathologic compression fracture of T6  large volume of Spinal Epidural Hemorrhage spanning from the cervical spine throughout the thoracic spine to the conus.  Widespread spinal cord mass-effect.  Neurosurgery and hematology evaluated, recommendation for 3 units FFP, 1 unit of platelets and 1 g of TXA  preprocedure, 1 unit platelets, 1 g TXA post procedure with additional FFP depending on volume status.  Neurosurgery performed T6 laminectomy and epidural clot evacuation.  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   Micro Data:  5/21 MRSA screen>> negative 5/21 SARS-Cov-2>> negative 5/21 urine culture>>   Antimicrobials:  Ceftriaxone 5/21-  Interim history/subjective:  Patient arrived to the intensive care unit intubated and sedated, requiring low-dose neo-Synephrine  Objective   Blood pressure (!) 118/56, pulse 78, temperature 97.7 F (36.5 C), temperature source Oral, resp. rate 15, SpO2 100 %.    Vent Mode: PRVC FiO2 (%):  [40 %] 40 % Set Rate:  [14 bmp-16 bmp] 14 bmp Vt Set:  [600 mL] 600 mL PEEP:  [5 cmH20] 5 cmH20 Plateau Pressure:  [15 cmH20] 15 cmH20   Intake/Output Summary (Last 24 hours)  at 05/14/2020 5997  Last data filed at 05/14/2020 0440 Gross per 24 hour  Intake 4920 ml  Output 2390 ml  Net 2530 ml   There were no vitals filed for this visit.  General: Elderly male, intubated and sedated HEENT: MM pink/moist, ET tube in place Neuro: Pupils 2 mm equal and reactive to light, breathing over the vent, unresponsive to pain CV: s1s2 RRR, no m/r/g PULM: CTA B GI: soft, bsx4 active  Extremities: warm/dry, no edema  Skin: no rashes or lesions   Resolved Hospital Problem list     Assessment & Plan:   Fall with pathologic T6 fracture and epidural hematoma causing cord compression Status post T6 laminectomy and clot evacuation by neurosurgery received 3 units FFP, 2 units platelets and 2 g TXA -Management per neurosurgery -repeat CBC and coags this AM -Neo-Synephrine weaned off, reduce propofol as able -Maintain full vent support with SAT/SBT as tolerated, -titrate Vent setting to maintain SpO2 greater than or equal to 90%. -HOB elevated 30 degrees. -Plateau pressures less than 30 cm H20.  -Follow chest x-ray, ABG prn.   -Bronchial hygiene and RT/bronchodilator protocol.    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 Irene Limbo and at [facility] by [name] -Work-up for possible multiple myeloma per hematology -pathology pending   History of seizure disorder Sound like both tonic-clonic, followed by neurology   HFrEF, CAD s/p CABG, AICD, atrial fibrillation, hypertension, hyperlipidemia Last echocardiogram in 03/2020 with EF of 30 to 35%, not chronically anti-coagulated given bleeding disorder -As blood pressure stabilized okay to continue Coreg, amiodarone -Continue Lasix, monitor volume status and I&O's    Best practice:  Diet: N.p.o. Pain/Anxiety/Delirium protocol (if indicated): Propofol, fentanyl VAP protocol (if indicated): HOB 30 degrees, daily SBT DVT prophylaxis: SCDs GI prophylaxis:  Protonix Glucose control: SSI Mobility: Bedrest Code Status: Full code Family Communication: Per primary Disposition: ICU  Labs   CBC: Recent Labs  Lab 04/25/2020 1531 05/11/2020 1551 05/14/20 0114 05/14/20 0159 05/14/20 0336  WBC 3.5*  --   --   --   --   NEUTROABS 2.6  --   --   --   --   HGB 12.6* 15.6 12.9* 11.9* 11.9*  HCT 41.5 46.0 38.0* 35.0* 35.0*  MCV 91.4  --   --   --   --   PLT 191  --   --   --   --     Basic Metabolic Panel: Recent Labs  Lab 05/23/2020 1531 05/12/2020 1551 05/14/20 0114 05/14/20 0159 05/14/20 0336  NA 135 139 141 140 141  K 4.0 3.6 3.9 3.8 3.6  CL 104 104  --   --   --   CO2 23  --   --   --   --   GLUCOSE 129* 122*  --   --   --   BUN 36* 41*  --   --   --   CREATININE 1.51* 1.40*  --   --   --   CALCIUM 8.4*  --   --   --   --    GFR: Estimated Creatinine Clearance: 51.5 mL/min (A) (by C-G formula based on SCr of 1.4 mg/dL (H)). Recent Labs  Lab 05/12/2020 1531  WBC 3.5*    Liver Function Tests: Recent Labs  Lab 05/23/2020 1531  AST 36  ALT 28  ALKPHOS 99  BILITOT 1.0  PROT 10.8*  ALBUMIN 2.9*   No results for input(s): LIPASE, AMYLASE in  the last 168 hours. No results for input(s): AMMONIA in the last 168 hours.  ABG    Component Value Date/Time   PHART 7.584 (H) 05/14/2020 0336   PCO2ART 21.8 (L) 05/14/2020 0336   PO2ART 154 (H) 05/14/2020 0336   HCO3 21.2 05/14/2020 0336   TCO2 22 05/14/2020 0336   ACIDBASEDEF 1.0 05/14/2020 0159   O2SAT 100.0 05/14/2020 0336     Coagulation Profile: Recent Labs  Lab 05/14/2020 1531  INR 1.4*    Cardiac Enzymes: No results for input(s): CKTOTAL, CKMB, CKMBINDEX, TROPONINI in the last 168 hours.  HbA1C: Hgb A1c MFr Bld  Date/Time Value Ref Range Status  07/25/2019 08:22 PM 5.2 4.8 - 5.6 % Final    Comment:    (NOTE) Pre diabetes:          5.7%-6.4% Diabetes:              >6.4% Glycemic control for   <7.0% adults with diabetes   11/26/2018 10:25 AM 5.8 4.6 - 6.5 %  Final    Comment:    Glycemic Control Guidelines for People with Diabetes:Non Diabetic:  <6%Goal of Therapy: <7%Additional Action Suggested:  >8%     CBG: No results for input(s): GLUCAP in the last 168 hours.  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   Surgical History    Past Surgical History:  Procedure Laterality Date  . ALVEOLOPLASTY N/A 07/26/2019   Procedure: ALVEOLOPLASTY WITH RESUTURING OF ORAL WOUND;  Surgeon: [name], [name];  Location: [facility] ORS;  Service: Oral Surgery;  Laterality: N/A;  . CARDIAC CATHETERIZATION N/A 10/12/2015   Procedure: Right Heart Cath;  Surgeon: [name], MD;  Location: [place] CV LAB;  Service: Cardiovascular;  Laterality: N/A;  . CARDIOVERSION  09/24/2012   Procedure: CARDIOVERSION;  Surgeon: [name], MD;  Location: [facility] ENDOSCOPY;  Service: Cardiovascular;  Laterality: N/A;  . CARDIOVERSION N/A 12/25/2017   Procedure: CARDIOVERSION;  Surgeon: [name], MD;  Location: [facility] ORS;  Service: Cardiovascular;  Laterality: N/A;  . COLONOSCOPY W/ POLYPECTOMY  02/13/2017  . CORONARY ARTERY BYPASS GRAFT  2011   in [place] N/A 08/12/2019   Procedure: CAUTERIZATION  OF ORAL BLEEDING;  Surgeon: [name], [name];  Location: [facility];  Service: Oral Surgery;  Laterality: N/A;  . FLEXIBLE SIGMOIDOSCOPY N/A 02/20/2017   Procedure: FLEXIBLE SIGMOIDOSCOPY;  Surgeon: [name], MD;  Location: [facility] ENDOSCOPY;  Service: Endoscopy;  Laterality: N/A;  . FLEXIBLE SIGMOIDOSCOPY N/A 02/21/2017   Procedure: FLEXIBLE SIGMOIDOSCOPY;  Surgeon: [name], MD;  Location: [facility] ENDOSCOPY;  Service: Endoscopy;  Laterality: N/A;  . ICD    . ICD GENERATOR CHANGEOUT N/A 09/10/2019   Procedure: [place];  Surgeon: [name], MD;  Location: [place] CV LAB;  Service: Cardiovascular;  Laterality: N/A;  . ICD LEAD REMOVAL Right 11/13/2019   Procedure: ICD EXTRACTION and Lead Extraction;  Surgeon: [name]  W, MD;  Location: [place];  Service: Cardiovascular;  Laterality: Right;  Dr. Servando Snare for backup  . POCKET REVISION/RELOCATION N/A 09/17/2019   Procedure: POCKET REVISION/RELOCATION;  Surgeon: Thompson Grayer, MD;  Location: [place] CV LAB;  Service: Cardiovascular;  Laterality: N/A;  . SUBMANDIBULAR GLAND EXCISION N/A 08/08/2019   Procedure: suture of oral wounds;  Surgeon: [name], [name];  Location: [facility];  Service: Oral Surgery;  Laterality: N/A;  . TEE WITHOUT CARDIOVERSION  09/24/2012   Procedure: TRANSESOPHAGEAL ECHOCARDIOGRAM (TEE);  Surgeon: [name], MD;  Location: [place];  Service: Cardiovascular;  Laterality: N/A;  dave/anesth, dl, cindy/echo   . TEE WITHOUT CARDIOVERSION N/A 11/12/2019   Procedure: TRANSESOPHAGEAL ECHOCARDIOGRAM (TEE);  Surgeon: [name], MD;  Location: [facility] ENDOSCOPY;  Service: Cardiovascular;  Laterality: N/A;  . TEE WITHOUT CARDIOVERSION N/A 11/13/2019   Procedure: TRANSESOPHAGEAL ECHOCARDIOGRAM (TEE);  Surgeon: [name], MD;  Location: [facility] OR;  Service: Cardiovascular;  Laterality: N/A;     Social History   reports that he quit smoking about 11 years ago. His smoking use included pipe. He quit  smokeless tobacco use about 11 years ago. He reports that he does not drink alcohol or use drugs.   Family History   His family history includes Arthritis in his mother; Heart attack in his mother; Heart disease in his mother; Other in his father. There is no history of Hypertension or Stroke.   Allergies No Known Allergies   Home Medications  Prior to Admission medications   Medication Sig Start Date End Date Taking? Authorizing Provider  acetaminophen (TYLENOL) 325 MG tablet Take 650 mg by mouth every 6 (six) hours as needed for mild pain.   Yes [provider]  amiodarone (PACERONE) 200 MG tablet Take 1 tablet (200 mg total) by mouth 2 (two) times daily. 03/31/20  Yes [name], [name], MD  carvedilol (COREG) 3.125 MG tablet Take 1 tablet (3.125 mg total) by mouth 2 (two) times daily with a meal. 02/24/20 11/20/20 Yes [name], MD  divalproex (DEPAKOTE ER) 500 MG 24 hr tablet TAKE 1 TABLET BY MOUTH ONCE DAILY AT NIGHT Patient taking differently: Take 500 mg by mouth daily.  05/04/20  Yes [name], MD  DULoxetine (CYMBALTA) 30 MG capsule Take 1 capsule by mouth once daily Patient taking differently: Take 30 mg by mouth daily.  10/13/19  Yes [name], MD  furosemide (LASIX) 40 MG tablet Take 1 tablet by mouth once daily Patient taking differently: Take 40 mg by mouth daily.  12/03/19  Yes [name], MD  levETIRAcetam (KEPPRA) 1000 MG tablet Take 1 tablet (1,000 mg total) by mouth 2 (two) times daily. 05/04/20  Yes [name], MD  phenytoin (DILANTIN) 100 MG ER capsule Take 1 capsule (100 mg total) by mouth 2 (two) times daily. 05/04/20  Yes [name], MD  simvastatin (ZOCOR) 20 MG tablet TAKE 1 TABLET BY MOUTH ONCE DAILY AT 6PM Patient taking differently: Take 20 mg by mouth daily. TAKE 1 TABLET BY MOUTH ONCE DAILY AT 6PM 03/08/20  Yes [name], MD  tamsulosin (FLOMAX) 0.4 MG CAPS capsule Take 1 capsule by mouth once daily Patient  taking differently: Take 0.4 mg by mouth daily.  02/09/20  Yes [name], MD  traMADol (ULTRAM) 50 MG tablet Take 1 tablet (50 mg total) by mouth every 6 (six) hours as needed for moderate pain. for pain 10/29/19  Yes [name],  MD     Critical care time: 62 minutes      CRITICAL CARE Performed by: Otilio Carpen Cayson Kalb   Total critical care time: 62 minutes  Critical care time was exclusive of separately billable procedures and treating other patients.  Critical care was necessary to treat or prevent imminent or life-threatening deterioration.  Critical care was time spent personally by me on the following activities: development of treatment plan with patient and/or surrogate as well as nursing, discussions with consultants, evaluation of patient's response to treatment, examination of patient, obtaining history from patient or surrogate, ordering and performing treatments and interventions, ordering and review of laboratory studies, ordering and review of radiographic studies, pulse oximetry and re-evaluation of patient's condition.   Otilio Carpen Uzziel Russey, PA-C

## 2020-05-14 NOTE — Anesthesia Procedure Notes (Addendum)
Central Venous Catheter Insertion Performed by: Roberts Gaudy, MD, anesthesiologist Start/End5/22/2021 12:40 AM, 05/14/2020 12:45 AM Patient location: Pre-op. Preanesthetic checklist: patient identified, IV checked, site marked, risks and benefits discussed, surgical consent, monitors and equipment checked, pre-op evaluation, timeout performed and anesthesia consent Lidocaine 1% used for infiltration and patient sedated Hand hygiene performed  and maximum sterile barriers used  Catheter size: 8 Fr Total catheter length 16. Central line was placed.Double lumen Procedure performed using ultrasound guided technique. Ultrasound Notes:image(s) printed for medical record Attempts: 1 Following insertion, dressing applied and line sutured. Post procedure assessment: blood return through all ports  Patient tolerated the procedure well with no immediate complications.

## 2020-05-14 NOTE — Progress Notes (Signed)
Hypoglycemic Event  CBG: 62  Treatment: 4 oz juice/soda and 8 oz juice/soda  Symptoms: None  Follow-up CBG: Time:1553 CBG Result:113  Possible Reasons for Event: Inadequate meal intake  Comments/MD notified: first follow-up from fingerstick was 15, but hands were cold, rechecked from CVC and obtained 115    Andrey Farmer

## 2020-05-14 NOTE — Procedures (Signed)
Extubation Procedure Note  Patient Details:   Name: [facility] DOB: [birth year] MRN: [id number]   Airway Documentation:    Vent end date: 05/14/20 Vent end time: 1010   Evaluation  O2 sats: stable throughout Complications: No apparent complications Patient did tolerate procedure well. Bilateral Breath Sounds: Diminished   Yes   Patient extubated to [place]. Vital signs stable at this time. No complications. RT will continue to monitor.  Mcneil Sober 05/14/2020, 10:13 AM

## 2020-05-14 NOTE — Progress Notes (Signed)
PT Cancellation Note  Patient Details Name: Louis Ford. MRN: [id number] DOB: 10/20/1946   Cancelled Treatment:    Reason Eval/Treat Not Completed: Medical issues which prohibited therapy. Per Dr. Christella Noa, PT should not mobilize patient prior to T6 stabilization. PT will hold until this surgery is complete and patient is medically appropriate for PT intervention.   Zenaida Niece 05/14/2020, 9:53 AM

## 2020-05-14 NOTE — Progress Notes (Signed)
Pt's wallet, 1 watch, 1 bracelet and 1 ring returned to wife, Mardene Celeste to be taken home. Pt belongings at bedised include cell phone and clothing.

## 2020-05-14 NOTE — Progress Notes (Signed)
OT Cancellation Note  Patient Details Name: Louis Ford. MRN: [id number] DOB: 1946/04/20   Cancelled Treatment:    Reason Eval/Treat Not Completed: Patient not medically ready(Per Dr. Christella Noa, hold OT intervention until T spine stabilized. Will continue to follow as available and appropriate.)  Zenovia Jarred, MSOT, OTR/L [place] [facility] Office Number: [phone] Pager: [phone]  Zenovia Jarred 05/14/2020, 9:56 AM

## 2020-05-14 NOTE — Progress Notes (Signed)
eLink Physician-Brief Progress Note Patient Name: [facility] DOB: Apr 10, 1946 MRN: [id number]   Date of Service  05/14/2020  HPI/Events of Note  Patient arrives from OR intubated and ventilated.   eICU Interventions  Will order: 1.Ventlator settings: 40%/PRVC 14/TV 800/P 5. 2. ABG at 5:15 AM. 3. Portable CXR STAT. 4. Propofol IV infusion. Titrate to RASS = 0 to -1.     Intervention Category Major Interventions: Respiratory failure - evaluation and management  Tinamarie Przybylski Eugene 05/14/2020, 4:19 AM

## 2020-05-14 NOTE — Progress Notes (Signed)
Verbal consent obtained by the nurse from [facility] (wife) @2330 , witnessed Producer, television/film/video.

## 2020-05-14 NOTE — Anesthesia Procedure Notes (Signed)
Arterial Line Insertion Start/End5/22/2021 12:49 AM, 05/14/2020 12:50 AM Performed by: Jearld Pies, CRNA, CRNA  Patient location: OR. Emergency situation Patient sedated Right was placed Catheter size: 20 G Hand hygiene performed  and Seldinger technique used Allen's test indicative of satisfactory collateral circulation Attempts: 1 Procedure performed without using ultrasound guided technique. Following insertion, dressing applied and Biopatch. Post procedure assessment: normal  Patient tolerated the procedure well with no immediate complications.

## 2020-05-14 NOTE — Anesthesia Preprocedure Evaluation (Signed)
Anesthesia Evaluation  Patient identified by MRN, date of birth, ID bandGeneral Assessment Comment:Complaining of back pain lying on his side, answers questions appropriately.  Reviewed: Allergy & Precautions, NPO status , Patient's Chart, lab work & pertinent test results  Airway Mallampati: II  TM Distance: >3 FB     Dental  (+) Edentulous Upper, Edentulous Lower   Pulmonary former smoker,    breath sounds clear to auscultation       Cardiovascular hypertension,  Rhythm:Irregular Rate:Normal     Neuro/Psych    GI/Hepatic   Endo/Other    Renal/GU      Musculoskeletal   Abdominal   Peds  Hematology   Anesthesia Other Findings   Reproductive/Obstetrics                             Anesthesia Physical Anesthesia Plan  ASA: IV and emergent  Anesthesia Plan: General   Post-op Pain Management:    Induction: Intravenous  PONV Risk Score and Plan: Ondansetron and Dexamethasone  Airway Management Planned: Oral ETT  Additional Equipment: Arterial line and CVP  Intra-op Plan:   Post-operative Plan: Possible Post-op intubation/ventilation  Informed Consent: I have reviewed the patients History and Physical, chart, labs and discussed the procedure including the risks, benefits and alternatives for the proposed anesthesia with the patient or authorized representative who has indicated his/her understanding and acceptance.       Plan Discussed with: CRNA and Anesthesiologist  Anesthesia Plan Comments: (Plan GA with art line, CVP. Will give TXA, FFP, and platelets as recommended by Dr. Jana Hakim. Will check TEG, give Novoseven, if bleeding persists despite the above measures.)        Anesthesia Quick Evaluation

## 2020-05-14 NOTE — Progress Notes (Signed)
Patient arrived on the floor in severe pain rated 10/10. MD requested that the wife should be called immediately to obtain a verbal consent. Inform consent obtained, CHG bath was done, MRSA swab sent to the lab awaiting result. Patient was transferred to short stay 2E room 36.

## 2020-05-14 NOTE — Progress Notes (Addendum)
[place] [phone]   [phone]  PROGRESS NOTE  Patient Care Team: [name], MD as PCP - General (Internal Medicine) [name], MD as PCP - Electrophysiology (Cardiology) [name], [name], MD as PCP - Advanced Heart Failure (Cardiology) [name], MD as Consulting Physician (Neurology)  Hematological/Oncological History # Unspecified Bleeding Disorder  HPI:  [place]. 74 y.o. male with medical history significant for an unspecified bleeding disorder who presents with severe back pain following a fall from standing. Initial evaluation with spinal imaging has shown a T6 vertebral body compression fracture deformity with bulky prevertebral hematoma with widespread spinal cord mass effect, with up to severe cord compression T4-T5 through T6-T7. Neurosurgery was consulted and performed a thoracic laminectomy for hematoma evacuation on 05/14/2020. The patient received 3 units of FFP and 2 units of platelets in the peri-operative period.   Interval History:  --thoracic laminectomy performed last night --Hgb drop from 12.9 on admission to 9.7 post operatively. Stable at 9.2 in the afternoon --nursing reports minimal blood on dressing, drain output standard for type of surgery (approximately 100 cc).  --vitals stable, pain well controlled.  MEDICAL HISTORY:  Past Medical History:  Diagnosis Date  . AICD (automatic cardioverter/defibrillator) present   . Atrial fibrillation   09/22/2012  . Blood loss anemia 04/18/2017   After GI bleed from colonoscopy and polypectomy  . CAD (coronary artery disease)   . Chronic systolic heart failure ([place])   . 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 GIB (gastrointestinal bleeding) 02/20/2017  . History of MRSA infection 05/2011  . HTN (hypertension)   . Hx of adenomatous colonic polyps 02/20/2017   01/2017 - 3 cm sigmoid TV adenoma and other  smaller polyps - had post-polypectomy bleed Tx w/ clips Consider repeat colonoscopy 3 yrs [name], MD, [name]   . Hyperlipidemia   . implantable cardiac defibrillator-Biotronik    Device Implanted 2006; s/p gen change 03/2011 : bleeding persistent with pocket erosion and infection; explant and reimplant  06/2011  . Ischemic cardiomyopathy    EF 15 to 20% by TTE and TEE in 09/2012.  Severe LV dysfunction  . Obesity    BMI 31 in 09/2012  . Persistent atrial fibrillation with rapid ventricular response ([place]) 04/21/2014  . Retroperitoneal bleed 04/21/2019  . Seizure disorder ([place]) latest 09/30/2012  . Seizures ([place])   . Stroke [facility])     SURGICAL HISTORY: Past Surgical History:  Procedure Laterality Date  . ALVEOLOPLASTY N/A 07/26/2019   Procedure: ALVEOLOPLASTY WITH RESUTURING OF ORAL WOUND;  Surgeon: [name], [name];  Location: [facility]S;  Service: Oral Surgery;  Laterality: N/A;  . CARDIAC CATHETERIZATION N/A 10/12/2015   Procedure: Right Heart Cath;  Surgeon: [name], MD;  Location: [place] CV LAB;  Service: Cardiovascular;  Laterality: N/A;  . CARDIOVERSION  09/24/2012   Procedure: CARDIOVERSION;  Surgeon: [name], MD;  Location: [facility] ENDOSCOPY;  Service: Cardiovascular;  Laterality: N/A;  . CARDIOVERSION N/A 12/25/2017   Procedure: CARDIOVERSION;  Surgeon: [name], MD;  Location: [facility];  Service: Cardiovascular;  Laterality: N/A;  . COLONOSCOPY W/ POLYPECTOMY  02/13/2017  . CORONARY ARTERY BYPASS GRAFT  2011   in [place] N/A 08/12/2019   Procedure: CAUTERIZATION OF ORAL BLEEDING;  Surgeon: [name], [name];  Location: [facility];  Service: Oral Surgery;  Laterality: N/A;  . FLEXIBLE SIGMOIDOSCOPY N/A 02/20/2017  Procedure: FLEXIBLE SIGMOIDOSCOPY;  Surgeon: [name], MD;  Location: [facility] ENDOSCOPY;  Service: Endoscopy;  Laterality: N/A;  . FLEXIBLE SIGMOIDOSCOPY N/A 02/21/2017   Procedure: FLEXIBLE SIGMOIDOSCOPY;  Surgeon: [name], MD;   Location: [facility] ENDOSCOPY;  Service: Endoscopy;  Laterality: N/A;  . ICD    . ICD GENERATOR CHANGEOUT N/A 09/10/2019   Procedure: [place];  Surgeon: [name], MD;  Location: [place] CV LAB;  Service: Cardiovascular;  Laterality: N/A;  . ICD LEAD REMOVAL Right 11/13/2019   Procedure: ICD EXTRACTION and Lead Extraction;  Surgeon: [name], MD;  Location: [place];  Service: Cardiovascular;  Laterality: Right;  Dr. Servando Snare for backup  . POCKET REVISION/RELOCATION N/A 09/17/2019   Procedure: POCKET REVISION/RELOCATION;  Surgeon: Thompson Grayer, MD;  Location: [place] CV LAB;  Service: Cardiovascular;  Laterality: N/A;  . SUBMANDIBULAR GLAND EXCISION N/A 08/08/2019   Procedure: suture of oral wounds;  Surgeon: [name], [name];  Location: [facility];  Service: Oral Surgery;  Laterality: N/A;  . TEE WITHOUT CARDIOVERSION  09/24/2012   Procedure: TRANSESOPHAGEAL ECHOCARDIOGRAM (TEE);  Surgeon: [name], MD;  Location: [place];  Service: Cardiovascular;  Laterality: N/A;  dave/anesth, dl, cindy/echo   . TEE WITHOUT CARDIOVERSION N/A 11/12/2019   Procedure: TRANSESOPHAGEAL ECHOCARDIOGRAM (TEE);  Surgeon: [name], MD;  Location: [facility] ENDOSCOPY;  Service: Cardiovascular;  Laterality: N/A;  . TEE WITHOUT CARDIOVERSION N/A 11/13/2019   Procedure: TRANSESOPHAGEAL ECHOCARDIOGRAM (TEE);  Surgeon: [name], MD;  Location: [facility] OR;  Service: Cardiovascular;  Laterality: N/A;    SOCIAL HISTORY: Social History   Socioeconomic History  . Marital status: Married    Spouse name: Not on file  . Number of children: Not on file  . Years of education: Not on file  . Highest education level: Not on file  Occupational History  . Not on file  Tobacco Use  . Smoking status: Former Smoker    Types: Pipe    Quit date: 09/21/2008    Years since quitting: 11.6  . Smokeless tobacco: Former Systems developer    Quit date: 09/21/2008  Substance and Sexual Activity  . Alcohol  use: No  . Drug use: No  . Sexual activity: Not Currently    Birth control/protection: None  Other Topics Concern  . Not on file  Social History Narrative   Right handed   Apartment ground floor   Drinks caffeine       Social Determinants of Health   Financial Resource Strain:   . Difficulty of Paying Living Expenses:   Food Insecurity:   . Worried About Charity fundraiser in the Last Year:   . Arboriculturist in the Last Year:   Transportation Needs:   . Film/video editor (Medical):   [name] Lack of Transportation (Non-Medical):   Physical Activity:   . Days of Exercise per Week:   . Minutes of Exercise per Session:   Stress:   . Feeling of Stress :   Social Connections:   . Frequency of Communication with Friends and Family:   . Frequency of Social Gatherings with Friends and Family:   . Attends Religious Services:   . Active Member of Clubs or Organizations:   . Attends Archivist Meetings:   [name] Marital Status:   Intimate Partner Violence:   . Fear of Current or Ex-Partner:   . Emotionally Abused:   [name] Physically Abused:   . Sexually Abused:  FAMILY HISTORY: Family History  Problem Relation Age of Onset  . Arthritis Mother   . Heart disease Mother   . Heart attack Mother   . Other Father        smoker  . Hypertension Neg Hx        unknown  . Stroke Neg Hx        unknown    ALLERGIES:  has No Known Allergies.  MEDICATIONS:  Current Facility-Administered Medications  Medication Dose Route Frequency Provider Last Rate Last Admin  . 0.9 %  sodium chloride infusion (Manually program via Guardrails IV Fluids)   Intravenous Once [name], MD      . 0.9 %  sodium chloride infusion (Manually program via Guardrails IV Fluids)   Intravenous Once [name], MD      . 0.9 %  sodium chloride infusion  250 mL Intravenous Continuous [name], MD      . 0.9 % NaCl with KCl 20 mEq/ L  infusion   Intravenous Continuous [name], MD 80  mL/hr at 05/14/20 1758 New Bag at 05/14/20 1758  . acetaminophen (TYLENOL) tablet 650 mg  650 mg Oral Q4H PRN [name], MD       Or  . acetaminophen (TYLENOL) suppository 650 mg  650 mg Rectal Q4H PRN [name], MD      . amiodarone (PACERONE) tablet 200 mg  200 mg Oral BID [name], MD   200 mg at 05/14/20 1226  . carvedilol (COREG) tablet 3.125 mg  3.125 mg Oral BID WC [name], MD   Stopped at 05/14/20 [phone]  . chlorhexidine gluconate (MEDLINE KIT) (PERIDEX) 0.12 % solution 15 mL  15 mL Mouth Rinse BID Shela Leff, MD   15 mL at 05/14/20 0756  . Chlorhexidine Gluconate Cloth 2 % PADS 6 each  6 each Topical Daily Agarwala, Ravi, MD      . dextrose 50 % solution           . diazepam (VALIUM) tablet 5 mg  5 mg Oral Q6H PRN [name], MD      . divalproex (DEPAKOTE ER) 24 hr tablet 500 mg  500 mg Oral Daily [name], MD   500 mg at 05/14/20 1600  . DULoxetine (CYMBALTA) DR capsule 30 mg  30 mg Oral Daily [name], MD   30 mg at 05/14/20 1558  . furosemide (LASIX) tablet 40 mg  40 mg Oral Daily [name], MD   40 mg at 05/14/20 1405  . insulin aspart (novoLOG) injection 0-9 Units  0-9 Units Subcutaneous Q4H Gleason, Otilio Carpen, PA-C      . levETIRAcetam (KEPPRA) tablet 1,000 mg  1,000 mg Oral BID [name], MD   1,000 mg at 05/14/20 1226  . MEDLINE mouth rinse  15 mL Mouth Rinse 10 times per day Shela Leff, MD   15 mL at 05/14/20 1600  . menthol-cetylpyridinium (CEPACOL) lozenge 3 mg  1 lozenge Oral PRN [name], MD       Or  . phenol (CHLORASEPTIC) mouth spray 1 spray  1 spray Mouth/Throat PRN [name], MD      . morphine 2 MG/ML injection 2 mg  2 mg Intravenous Q2H PRN [name], MD   2 mg at 05/14/20 1605  . ondansetron (ZOFRAN) tablet 4 mg  4 mg Oral Q6H PRN [name], MD       Or  . ondansetron (ZOFRAN) injection 4 mg  4 mg Intravenous Q6H  PRN [name], MD   4 mg at 05/14/20 1216  . oxyCODONE (Oxy IR/ROXICODONE)  immediate release tablet 10 mg  10 mg Oral Q3H PRN [name], MD   10 mg at 05/14/20 1404  . oxyCODONE (Oxy IR/ROXICODONE) immediate release tablet 5 mg  5 mg Oral Q3H PRN [name], MD      . pantoprazole (PROTONIX) EC tablet 40 mg  40 mg Oral Daily Agarwala, Einar Grad, MD   40 mg at 05/14/20 1406  . phenylephrine (NEOSYNEPHRINE) 10-0.9 MG/250ML-% infusion  0-400 mcg/min Intravenous Titrated Gleason, Otilio Carpen, PA-C 52.5 mL/hr at 05/14/20 0645 35 mcg/min at 05/14/20 0645  . phenytoin (DILANTIN) ER capsule 100 mg  100 mg Oral BID [name], MD   100 mg at 05/14/20 1559  . simvastatin (ZOCOR) tablet 20 mg  20 mg Oral Daily [name], MD   20 mg at 05/14/20 1406  . sodium chloride flush (NS) 0.9 % injection 3 mL  3 mL Intravenous Q12H [name], MD   3 mL at 05/14/20 1000  . sodium chloride flush (NS) 0.9 % injection 3 mL  3 mL Intravenous PRN [name], MD      . tamsulosin (FLOMAX) capsule 0.4 mg  0.4 mg Oral Daily [name], MD   0.4 mg at 05/14/20 1405  . traMADol (ULTRAM) tablet 50 mg  50 mg Oral Q6H PRN [name], MD   50 mg at 05/14/20 1226    REVIEW OF SYSTEMS:   Constitutional: ( - ) fevers, ( - )  chills , ( - ) night sweats Eyes: ( - ) blurriness of vision, ( - ) double vision, ( - ) watery eyes Ears, nose, mouth, throat, and face: ( - ) mucositis, ( - ) sore throat Respiratory: ( - ) cough, ( - ) dyspnea, ( - ) wheezes Cardiovascular: ( - ) palpitation, ( - ) chest discomfort, ( - ) lower extremity swelling Gastrointestinal:  ( - ) nausea, ( - ) heartburn, ( - ) change in bowel habits Skin: ( - ) abnormal skin rashes Lymphatics: ( - ) new lymphadenopathy, ( - ) easy bruising Neurological: ( - ) numbness, ( - ) tingling, ( - ) new weaknesses Behavioral/Psych: ( - ) mood change, ( - ) new changes  All other systems were reviewed with the patient and are negative.  PHYSICAL EXAMINATION:  Vitals:   05/14/20 1500 05/14/20 1600  BP: (!) 123/93 108/70  Pulse:  91 95  Resp: 20 11  Temp:  (!) 97.5 F (36.4 C)  SpO2: 100% 98%   Filed Weights   05/14/20 1200  Weight: 191 lb 12.8 oz (87 kg)    GENERAL: well appearing elderly African American male. alert, no distress and comfortable SKIN: skin color, texture, turgor are normal, no rashes or significant lesions EYES: conjunctiva are pink and non-injected, sclera clear LUNGS: clear to auscultation and percussion with normal breathing effort HEART: regular rate & rhythm and no murmurs and no lower extremity edema Musculoskeletal: no cyanosis of digits and no clubbing  PSYCH: alert & oriented x 3, fluent speech  LABORATORY DATA:  I have reviewed the data as listed CBC Latest Ref Rng & Units 05/14/2020 05/14/2020 05/14/2020  WBC 4.0 - 10.5 K/uL 6.7 5.2 -  Hemoglobin 13.0 - 17.0 g/dL 9.2(L) 9.7(L) 11.9(L)  Hematocrit 39.0 - 52.0 % 30.5(L) 30.5(L) 35.0(L)  Platelets 150 - 400 K/uL 191 203 -    CMP Latest Ref Rng & Units 05/14/2020  05/14/2020 05/14/2020  Glucose 70 - 99 mg/dL - - -  BUN 8 - 23 mg/dL - - -  Creatinine 0.61 - 1.24 mg/dL - - -  Sodium 135 - 145 mmol/L 141 140 141  Potassium 3.5 - 5.1 mmol/L 3.6 3.8 3.9  Chloride 98 - 111 mmol/L - - -  CO2 22 - 32 mmol/L - - -  Calcium 8.9 - 10.3 mg/dL - - -  Total Protein 6.5 - 8.1 g/dL - - -  Total Bilirubin 0.3 - 1.2 mg/dL - - -  Alkaline Phos 38 - 126 U/L - - -  AST 15 - 41 U/L - - -  ALT 0 - 44 U/L - - -    Lab Results  Component Value Date   MPROTEIN 4.6 (H) 12/29/2019   MPROTEIN 1.6 (H) 07/29/2019   Lab Results  Component Value Date   KPAFRELGTCHN 23.1 (H) 12/29/2019   LAMBDASER 7.1 12/29/2019   KAPLAMBRATIO 3.25 (H) 12/29/2019    RADIOGRAPHIC STUDIES:  CT HEAD WO CONTRAST  Result Date: 04/29/2020 CLINICAL DATA:  Fall, back pain, history of retroperitoneal bleeding. EXAM: CT HEAD WITHOUT CONTRAST CT CERVICAL SPINE WITHOUT CONTRAST TECHNIQUE: Multidetector CT imaging of the head and cervical spine was performed following the  standard protocol without intravenous contrast. Multiplanar CT image reconstructions of the cervical spine were also generated. COMPARISON:  Prior head CT examinations 07/20/2019 and earlier, chest CT 10/29/2019. 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 Stable ill-defined hypoattenuation within the cerebral white matter which is nonspecific, but consistent with chronic small vessel ischemic disease. Stable, mild generalized parenchymal atrophy. There is no acute intracranial hemorrhage. No acute demarcated cortical infarct. No extra-axial fluid collection. No evidence of intracranial mass. No midline shift. Vascular: No hyperdense vessel.  Atherosclerotic calcifications. Skull: Normal. Negative for fracture or focal lesion. Sinuses/Orbits: Visualized orbits show no acute finding. Mild ethmoid sinus mucosal thickening. No significant mastoid effusion. CT CERVICAL SPINE FINDINGS Alignment: Straightening of the expected cervical lordosis. Minimal C7-T1 anterolisthesis. Skull base and vertebrae: The basion-dental and atlanto-dental intervals are maintained.No evidence of acute fracture to the cervical spine. Soft tissues and spinal canal: No prevertebral fluid or swelling. No visible canal hematoma. Disc levels: Cervical spondylosis with multilevel disc space narrowing, posterior disc osteophytes, uncovertebral and facet hypertrophy. Disc space narrowing is advanced at C3-C4 and C4-C5. Degenerative fusion of the C5-C6 vertebral bodies. Upper chest: Reported separately. No consolidation within the imaged lung apices. No visible pneumothorax. Prior median sternotomy. Indeterminate 9 mm well-circumscribed lucent focus within the posterior T1 vertebral body. Incompletely imaged irregular  lucency within the posterior left second rib. IMPRESSION: CT head: 1. No evidence of acute intracranial abnormality. 2. 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 3. Stable mild generalized parenchymal atrophy and chronic small vessel ischemic disease. 4. Mild ethmoid sinus mucosal thickening. Cervical spine: 1. No evidence of acute fracture to the cervical spine. 2. Indeterminate 9 mm well-circumscribed lucent lesion within the posterior T1 vertebral body. Additionally, there is incompletely imaged irregular lucency within the posterior left second rib. Please refer to separately reported CT chest. 3. Cervical spondylosis as described. Degenerative C5-C6 fusion. 4. Minimal C7-T1 grade 1 anterolisthesis. Electronically Signed   By: Kellie Simmering DO   On: 05/17/2020 18:43   CT  Chest W Contrast  Result Date: 05/17/2020 CLINICAL DATA:  Status post fall. EXAM: CT CHEST, ABDOMEN, AND PELVIS WITH CONTRAST TECHNIQUE: Multidetector CT imaging of the chest, abdomen and pelvis was performed following the standard protocol during bolus administration of intravenous contrast. CONTRAST:  147m OMNIPAQUE IOHEXOL 300 MG/ML  SOLN COMPARISON:  None. FINDINGS: CT CHEST FINDINGS Cardiovascular: There is moderate severity calcification of the aortic arch. There is moderate severity cardiomegaly. No pericardial effusion. Mediastinum/Nodes: No enlarged mediastinal, hilar, or axillary lymph nodes. Thyroid gland, trachea, and esophagus demonstrate no significant findings. Lungs/Pleura: A 3.7 cm x 1.5 cm low-attenuation mass is seen along the posterior aspect of the left apex. An adjacent expansile lytic area is seen within the posterior aspect of the second left rib (axial CT image 15, CT series number 3). Moderate severity atelectasis and/or infiltrate is seen within the left lower lobe. There is no evidence of a pleural effusion or pneumothorax. Musculoskeletal:  Multiple sternal wires are seen. A 4.3 cm x 2.2 cm intramuscular lipoma is seen along the anterior left chest wall. Degenerative changes are seen throughout the thoracic spine. CT ABDOMEN PELVIS FINDINGS Hepatobiliary: No focal liver abnormality is seen. No gallstones, gallbladder wall thickening, or biliary dilatation. Pancreas: Unremarkable. No pancreatic ductal dilatation or surrounding inflammatory changes. Spleen: Normal in size without focal abnormality. Adrenals/Urinary Tract: Adrenal glands are unremarkable. Innumerable cysts of various sizes are seen within both kidneys. There is no evidence of renal calculi or hydronephrosis. Bladder is unremarkable. Stomach/Bowel: Stomach is within normal limits. Appendix appears normal. No evidence of bowel wall thickening, distention, or inflammatory changes. Vascular/Lymphatic: There is moderate severity calcification and atherosclerosis throughout the abdominal aorta. No enlarged abdominal or pelvic lymph nodes. Reproductive: The prostate gland is mildly enlarged. Other: No abdominal wall hernia or abnormality. No abdominopelvic ascites. Musculoskeletal: Multilevel degenerative changes are seen throughout the lumbar spine. IMPRESSION: 1. 3.7 cm x 1.5 cm low-attenuation mass along the posterior aspect of the left apex with an adjacent expansile lytic area seen within the posterior aspect of the second left rib. 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 2. Moderate severity atelectasis and/or infiltrate within the left lower lobe. 3. Innumerable cysts of various sizes within both kidneys consistent with polycystic kidney disease. 4. 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 5. Mild enlargement of the prostate gland. 6. Aortic atherosclerosis. Aortic Atherosclerosis (ICD10-I70.0). Electronically Signed   By: TVirgina NorfolkM.D.   On: 04/28/2020 18:34   CT CERVICAL SPINE WO  CONTRAST  Result Date: 04/24/2020 CLINICAL DATA:  Fall, back pain, history of retroperitoneal bleeding. EXAM: CT HEAD WITHOUT CONTRAST CT CERVICAL SPINE WITHOUT CONTRAST TECHNIQUE: Multidetector CT imaging of the head and cervical spine was performed following the standard protocol without intravenous contrast. Multiplanar CT image reconstructions of the cervical spine were also generated. COMPARISON:  Prior head CT examinations 07/20/2019 and earlier, chest CT 10/29/2019. 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 Stable ill-defined hypoattenuation within the cerebral white matter which is nonspecific, but consistent with chronic small vessel ischemic disease. Stable, mild generalized parenchymal atrophy. There is no acute intracranial hemorrhage. No acute demarcated cortical infarct. No extra-axial fluid collection. No evidence of intracranial mass. No midline shift. Vascular: No hyperdense vessel.  Atherosclerotic calcifications.  Skull: Normal. Negative for fracture or focal lesion. Sinuses/Orbits: Visualized orbits show no acute finding. Mild ethmoid sinus mucosal thickening. No significant mastoid effusion. CT CERVICAL SPINE FINDINGS Alignment: Straightening of the expected cervical lordosis. Minimal C7-T1 anterolisthesis. Skull base and vertebrae: The basion-dental and atlanto-dental intervals are maintained.No evidence of acute fracture to the cervical spine. Soft tissues and spinal canal: No prevertebral fluid or swelling. No visible canal hematoma. Disc levels: Cervical spondylosis with multilevel disc space narrowing, posterior disc osteophytes, uncovertebral and facet hypertrophy. Disc space narrowing is advanced at C3-C4 and C4-C5. Degenerative fusion of the C5-C6  vertebral bodies. Upper chest: Reported separately. No consolidation within the imaged lung apices. No visible pneumothorax. Prior median sternotomy. Indeterminate 9 mm well-circumscribed lucent focus within the posterior T1 vertebral body. Incompletely imaged irregular lucency within the posterior left second rib. IMPRESSION: CT head: 1. No evidence of acute intracranial abnormality. 2. 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 3. Stable mild generalized parenchymal atrophy and chronic small vessel ischemic disease. 4. Mild ethmoid sinus mucosal thickening. Cervical spine: 1. No evidence of acute fracture to the cervical spine. 2. Indeterminate 9 mm well-circumscribed lucent lesion within the posterior T1 vertebral body. Additionally, there is incompletely imaged irregular lucency within the posterior left second rib. Please refer to separately reported CT chest. 3. Cervical spondylosis as described. Degenerative C5-C6 fusion. 4. Minimal C7-T1 grade 1 anterolisthesis. Electronically Signed   By: Kellie Simmering DO   On: 05/15/2020 18:43   MR THORACIC SPINE W WO CONTRAST  Addendum Date: 05/12/2020   ADDENDUM REPORT: 05/20/2020 22:19 ADDENDUM: Critical Value/emergent results were called by telephone at the time of interpretation on 04/27/2020 at 2210 hours to Dr. Shirlyn Goltz , who verbally acknowledged these results. Furthermore, after reading the Lumbar MRI on this patient, it is possible this large amount of intraspinal hematoma is in the subdural space in addition to (or rather than) the epidural space. Electronically Signed   By: Genevie Ann M.D.   On: 05/12/2020 22:19   Result Date: 05/05/2020 CLINICAL DATA:  74 year old male status post fall with T6 vertebral fracture and abnormal appearance of the thoracic spinal canal suggesting cord compression on CT. Lytic left 2nd and 5th posterior ribs. EXAM: MRI THORACIC WITHOUT AND WITH CONTRAST  TECHNIQUE: Multiplanar and multiecho pulse sequences of the thoracic spine were obtained without and with intravenous contrast. CONTRAST:  8.56m GADAVIST GADOBUTROL 1 MMOL/ML IV SOLN COMPARISON:  CT Chest, Abdomen, and Pelvis, thoracic and lumbar spine today reported separately. FINDINGS: Limited cervical spine imaging: Suspected abnormal epidural material tracking cephalad into the cervical spine from the thoracic spine, probably to at least the C6 level. Thoracic spine segmentation:  Normal. Alignment: Stable from the earlier CTs. No significant spondylolisthesis. Vertebrae: T6 vertebral body compression with 35% loss of vertebral body height and diffusely abnormal, decreased precontrast T1 marrow signal throughout the body, but also the right T6 pedicle and lamina. Associated intense enhancement and STIR hyperintensity. Mild retropulsion of bone or abnormal soft tissue. There are also decreased T1, STIR hyperintense, and enhancing lesions in the left T8 transverse process, T8 spinous process, right posterior T12 vertebral body, as well as the left 3rd, 5th, and 6th posterior ribs. No dispense Lea the bulky soft tissue destroying and extending out of the left 3rd rib (series 19, image 6). No other thoracic pathologic fracture identified. Cord: Diffusely abnormal spinal canal where there appears to be a large volume of T1 and T2  hyperintense, partially GRE hypointense, hemorrhage (series 16, image 9) tracking from the level of the conus throughout the thoracic spine and into the lower cervical spine. There is widespread associated spinal cord mass effect which is mild at the cervicothoracic junction (series 18, image 1) but severe at the T4-T5 through T6-T7 levels (series 18, images 15 through 18). And dorsal to the abnormal T6 vertebra the epidural blood appears most heterogeneous and also demonstrates enhancement following contrast (series 22, image 8). See also series 18, image 18 and series 23, image 18. No  superimposed dural thickening or enhancement. No abnormal spinal cord signal despite the compression. No abnormal intra-axial enhancement of the cord. Paraspinal and other soft tissues: There is also bulky abnormal prevertebral material in the mid and upper thoracic spine which is heterogeneous on T2 and GRE sequences, T1 hypointense and seems not to be enhancing. Burtis Junes this is also hemorrhage. Superimposed pleural fluid or hemothorax. There is possibly a small area of soft tissue injury superficial to the T5 and T6 spinous processes. But otherwise the dorsal paraspinal soft tissues are within normal limits. Partially visible polycystic renal disease in the upper abdomen. Disc levels: No age advanced degenerative changes. IMPRESSION: 1. 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 2. Subsequent widespread spinal cord mass effect, with up to severe cord compression T4-T5 through T6-T7. No cord signal abnormality identified, although there could be active bleeding (enhancement) within the dorsal epidural hematoma at T5 and T6. 3. Similar bulky prevertebral hematoma also suspected in the midthoracic spine. And small pleural effusions or pleural blood. 4. See also Lumbar MRI reported separately. Electronically Signed: By: Genevie Ann M.D. On: 05/11/2020 22:05   MR Lumbar Spine W Wo Contrast  Result Date: 04/25/2020 CLINICAL DATA:  74 year old male status post fall with T6 vertebral fracture and abnormal appearance of the thoracic spinal canal suggesting cord compression on CT. Lytic left 2nd and 5th posterior ribs. EXAM: MRI LUMBAR SPINE WITHOUT AND WITH CONTRAST TECHNIQUE: Multiplanar and multiecho pulse sequences of the lumbar spine were obtained without and with intravenous contrast. CONTRAST:  8.40m GADAVIST GADOBUTROL 1 MMOL/ML IV  SOLN COMPARISON:  Thoracic spine MRI today reported separately along with CT Chest, Abdomen, and Pelvis today. FINDINGS: Segmentation: Normal, concordant with the thoracic spine numbering today. Alignment: Mild straightening of lumbar lordosis. Subtle retrolisthesis of L1 on L2. subtle anterolisthesis of L4 on L5. Vertebrae: Enhancing bone lesions in the right posterior T12 and inferior L4 vertebral bodies. Smaller enhancing lesions in the left L5 pedicle, left S3 vertebra. Compression and heterogeneous enhancement and edema throughout the L3 vertebral body. Central loss of height up to 25%. No retropulsed bone. However, there is an associated right transverse process fracture of L3 with marrow edema, no enhancement. But preserved normal marrow signal in the other L3 posterior elements. Visible SI joints appear intact. There is an expansile bone lesion of the right iliac wing on series 29, image 34. Conus medullaris and cauda equina: Only faint abnormal epidural or subdural space signal along the visible lower thoracic spinal cord and conus (series 23, image 9 and series 26, image 5. This does not continue below the T12-L1 level. Conus extends to the T12-L1 level. No lower spinal cord or conus signal abnormality. Cauda equina nerve roots appear fairly normal. No abnormal intradural enhancement in the lumbar spine. Paraspinal and other  soft tissues: Polycystic renal disease. Lumbar paraspinal soft tissues are within normal limits. Disc levels: Mostly mild for age lumbar spine degeneration. There is moderate to severe facet hypertrophy in the lower lumbar spine. No degenerative spinal stenosis. IMPRESSION: 1. 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 2. Probable pathologic fracture of the L3 vertebral body. 25% loss of height with no retropulsion. Associated fracture of the right L3 transverse process. 3. The abnormal epidural (versus subdural)  Intraspinal Hemorrhage seen in the thoracic spine tapers at the level of the conus, T12-L1. No conus signal abnormality and normal cauda equina nerve roots. 4. Ordinary lower lumbar spine degeneration, but no degenerative spinal stenosis. Electronically Signed   By: Genevie Ann M.D.   On: 05/23/2020 22:16   MR ABDOMEN WWO CONTRAST  Result Date: 04/27/2020 CLINICAL DATA:  Pancreatic lesion on prior CT EXAM: MRI ABDOMEN WITHOUT AND WITH CONTRAST TECHNIQUE: Multiplanar multisequence MR imaging of the abdomen was performed both before and after the administration of intravenous contrast. CONTRAST:  63m MULTIHANCE GADOBENATE DIMEGLUMINE 529 MG/ML IV SOLN COMPARISON:  CT abdomen/pelvis dated 02/18/2020. CT abdomen/pelvis dated 01/12/2020. CTA chest dated 10/29/2019. CTA chest abdomen pelvis dated 04/13/2019. FINDINGS: Lower chest: Lung bases are clear.  Cardiomegaly. Hepatobiliary: Liver is within normal limits. No suspicious/enhancing hepatic lesions. Layering tiny gallstones (series 3/image 25). No associated inflammatory changes. No intrahepatic or extrahepatic ductal dilatation. Pancreas: Mild ductal dilatation in the distal pancreatic tail (series 3/image 18) with associated mild segmental atrophy of the proximal pancreatic tail (series 3/image 22). An underlying 14 x 18 mm hypoenhancing, non contour deforming lesion in the proximal pancreatic tail is possible (series 14/image 68), although poorly evaluated/visualized due to the limitations of this study. Spleen:  Within normal limits. Adrenals/Urinary Tract:  Adrenal glands are within normal limits. Numerous bilateral renal cysts of varying sizes and complexities, including a dominant 7.4 cm right lower pole simple cyst (series 3/image 84) and a dominant 6.7 cm left upper pole septated cyst (series 3/image 20), benign (Bosniak I-II). No enhancing renal lesions. No hydronephrosis. Stomach/Bowel: Stomach is within normal limits. Visualized bowel is unremarkable.  Vascular/Lymphatic:  No evidence of abdominal aortic aneurysm. No suspicious abdominal lymphadenopathy. Other:  No abdominal ascites. Musculoskeletal: No focal osseous lesions. IMPRESSION: Possible 14 x 18 mm hypoenhancing lesion in the proximal pancreatic tail, poorly evaluated/visualized on the current study. Associated mild ductal dilatation in the distal pancreatic tail. EUS is suggested for further evaluation. These results will be called to the ordering clinician or representative by the Radiologist Assistant, and communication documented in the PACS or [employer] Electronically Signed   By: SJulian HyM.D.   On: 04/27/2020 08:13   CT ABDOMEN PELVIS W CONTRAST  Result Date: 05/14/2020 CLINICAL DATA:  Status post fall with subsequent back pain. EXAM: CT CHEST, ABDOMEN, AND PELVIS WITH CONTRAST TECHNIQUE: Multidetector CT imaging of the chest, abdomen and pelvis was performed following the standard protocol during bolus administration of intravenous contrast. CONTRAST:  1027mOMNIPAQUE IOHEXOL 300 MG/ML  SOLN COMPARISON:  None. FINDINGS: CT CHEST FINDINGS Cardiovascular: There is moderate severity calcification of the aortic arch. There is moderate severity cardiomegaly. No pericardial effusion. Mediastinum/Nodes: No enlarged mediastinal, hilar, or axillary lymph nodes. Thyroid gland, trachea, and esophagus demonstrate no significant findings. Lungs/Pleura: A 3.7 cm x 1.5 cm low-attenuation mass is seen along the posterior aspect of the left apex. An adjacent expansile lytic area is seen within the posterior aspect of the  second left rib (axial CT image 15, CT series number 3). Moderate severity atelectasis and/or infiltrate is seen within the left lower lobe. There is no evidence of a pleural effusion or pneumothorax. Musculoskeletal: Multiple sternal wires are seen. A 4.3 cm x 2.2 cm intramuscular lipoma is seen along the anterior left chest wall. Degenerative changes are seen throughout the  thoracic spine. CT ABDOMEN PELVIS FINDINGS Hepatobiliary: No focal liver abnormality is seen. No gallstones, gallbladder wall thickening, or biliary dilatation. Pancreas: Unremarkable. No pancreatic ductal dilatation or surrounding inflammatory changes. Spleen: Normal in size without focal abnormality. Adrenals/Urinary Tract: Adrenal glands are unremarkable. Innumerable cysts of various sizes are seen within both kidneys. There is no evidence of renal calculi or hydronephrosis. Bladder is unremarkable. Stomach/Bowel: Stomach is within normal limits. Appendix appears normal. No evidence of bowel wall thickening, distention, or inflammatory changes. Vascular/Lymphatic: There is moderate severity calcification and atherosclerosis throughout the abdominal aorta. No enlarged abdominal or pelvic lymph nodes. Reproductive: The prostate gland is mildly enlarged. Other: No abdominal wall hernia or abnormality. No abdominopelvic ascites. Musculoskeletal: Multilevel degenerative changes are seen throughout the lumbar spine. IMPRESSION: 1. 3.7 cm x 1.5 cm low-attenuation mass along the posterior aspect of the left apex with an adjacent expansile lytic area seen within the posterior aspect of the second left rib. 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 2. Moderate severity atelectasis and/or infiltrate within the left lower lobe. 3. Innumerable cysts of various sizes within both kidneys consistent with polycystic kidney disease. 4. 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 5. Mild enlargement of the prostate gland. 6. Aortic atherosclerosis. Aortic Atherosclerosis (ICD10-I70.0). Electronically Signed   By: Virgina Norfolk M.D.   On: 04/24/2020 18:33   DG Thoracic Spine 1 View  Result Date: 05/14/2020 CLINICAL DATA:  T6 laminectomy EXAM: OPERATIVE THORACIC SPINE 1 VIEW(S) FLUOROSCOPY TIME:  28 seconds COMPARISON:  MRI thoracic spine  dated 05/23/2020 FINDINGS: Single intraoperative fluoroscopic image during T6 laminectomy, used for intraoperative localization. IMPRESSION: Single intraoperative fluoroscopic image during T6 laminectomy. Electronically Signed   By: Julian Hy M.D.   On: 05/14/2020 05:05   CT T-SPINE NO CHARGE  Result Date: 05/10/2020 CLINICAL DATA:  Status post fall. EXAM: CT THORACIC SPINE WITHOUT CONTRAST TECHNIQUE: Multidetector CT images of the thoracic were obtained using the standard protocol without intravenous contrast. COMPARISON:  None. FINDINGS: Alignment: Normal. Vertebrae: A compression fracture deformity is seen involving the T6 vertebral body. This is of indeterminate age. An adjacent 2.3 cm x 2.2 cm lytic area is seen within this region. Mild paraspinal soft tissue swelling is noted. Paraspinal and other soft tissues: A 3.9 cm x 1.5 cm heterogeneous low-attenuation soft tissue mass is seen along the posterior aspect of the left apex. The adjacent portion of the posterior second left rib is expansile and lytic in appearance. Disc levels: Mild multilevel endplate sclerosis is seen with mild intervertebral disc space narrowing is seen. IMPRESSION: 1. T6 vertebral body compression fracture deformity with an associated 2.3 cm x 2.2 cm lytic area and paraspinal soft tissue swelling. Further evaluation with MRI is recommended. 2. 3.9 cm x 1.5 cm heterogeneous low-attenuation soft tissue mass along the posterior aspect of the left apex with an expansile and lytic appearing posterior second left rib. This may represent a primary bronchogenic carcinoma. Further evaluation with a nuclear medicine PET/CT scan is recommended. Electronically Signed   By: Joyce Gross.D.  On: 05/17/2020 18:39   CT L-SPINE NO CHARGE  Result Date: 04/23/2020 CLINICAL DATA:  Status post fall. EXAM: CT LUMBAR SPINE WITHOUT CONTRAST TECHNIQUE: Multidetector CT imaging of the lumbar spine was performed without intravenous contrast  administration. Multiplanar CT image reconstructions were also generated. COMPARISON:  None. FINDINGS: Segmentation: 5 lumbar type vertebrae. Alignment: Normal. Vertebrae: No acute fracture. A 1.6 cm x 0.9 cm well-defined lytic area is seen within the posterior aspect of the lower L4 vertebral body (sagittal reformatted image 150, CT series number 1). Paraspinal and other soft tissues: Negative. Disc levels: Mild to moderate severity multilevel endplate sclerosis is seen with mild multilevel intervertebral disc space narrowing IMPRESSION: 1. No acute fracture within the lumbar spine. 2. Mild to moderate severity multilevel degenerative changes. 3. 1.6 cm x 0.9 cm well-defined lytic area within the posterior aspect of the lower L4 vertebral body. This may represent a small hemangioma. MRI correlation is recommended. Electronically Signed   By: Virgina Norfolk M.D.   On: 05/07/2020 18:41   DG CHEST PORT 1 VIEW  Result Date: 05/14/2020 CLINICAL DATA:  Intubated EXAM: PORTABLE CHEST 1 VIEW COMPARISON:  05/12/2020 at 1517 hours FINDINGS: Endotracheal tube terminates 5.5 cm above the carina. Enteric tube terminates at the level of the diaphragm/GE junction. Left IJ venous catheter terminates in the distal left brachiocephalic vein/SVC. Mild left basilar atelectasis. Lungs are otherwise clear. No pleural effusion or pneumothorax. Cardiomegaly.  Postsurgical changes related to prior CABG. Median sternotomy.  Skin staples overlying the midline chest. IMPRESSION: Endotracheal tube terminates 5.5 cm above the carina. Enteric tube terminates at the level of the diaphragm/GE junction. Left IJ venous catheter terminates in the distal left brachiocephalic vein/SVC. No evidence of acute cardiopulmonary disease. Electronically Signed   By: Julian Hy M.D.   On: 05/14/2020 05:04   DG Chest Port 1 View  Result Date: 05/10/2020 CLINICAL DATA:  [place] while walking to the bathroom with his walker, lost balance and fell  into seated position, BILATERAL flank pain EXAM: PORTABLE CHEST 1 VIEW COMPARISON:  Portable exam 1517 hours compared to 01/12/2020 FINDINGS: Enlargement of cardiac silhouette post median sternotomy. Atherosclerotic calcification aorta. Mediastinal contours and pulmonary vascularity normal. LEFT basilar atelectasis without pulmonary infiltrate, pleural effusion, or pneumothorax. Bones demineralized. IMPRESSION: Enlargement of cardiac silhouette with LEFT basilar atelectasis. Electronically Signed   By: Lavonia Dana M.D.   On: 05/14/2020 15:24   DG Knee Complete 4 Views Left  Result Date: 05/03/2020 CLINICAL DATA:  Status post trauma. EXAM: LEFT KNEE - COMPLETE 4+ VIEW COMPARISON:  None. FINDINGS: No evidence of fracture, dislocation, or joint effusion. Moderate severity narrowing of the medial and lateral tibiofemoral compartment spaces is seen. There is marked severity vascular calcification. IMPRESSION: Moderate severity degenerative changes without evidence of acute osseous abnormality. Electronically Signed   By: Virgina Norfolk M.D.   On: 05/07/2020 16:11   DG C-Arm 1-60 Min  Result Date: 05/14/2020 CLINICAL DATA:  T6 laminectomy EXAM: OPERATIVE THORACIC SPINE 1 VIEW(S) FLUOROSCOPY TIME:  28 seconds COMPARISON:  MRI thoracic spine dated 05/23/2020 FINDINGS: Single intraoperative fluoroscopic image during T6 laminectomy, used for intraoperative localization. IMPRESSION: Single intraoperative fluoroscopic image during T6 laminectomy. Electronically Signed   By: Julian Hy M.D.   On: 05/14/2020 05:05    ASSESSMENT & PLAN [facility]. 74 y.o. male with medical history significant for an unspecified bleeding disorder who presents with severe back pain following a fall from standing. He is requiring neurosurgical intervention for a spinal hematoma.  The patient has undergone a thoracic laminectomy for hematoma evacuation performed last night and appears to have tolerated the procedure well.   He received 3 units of FFP as well as 1 unit of platelets preop and 1 unit of platelets postoperatively.  He is also received tranexamic acid dosed in the perioperative period.  Hemoglobin has dropped postoperatively, but has been stable since the end of surgery.  He is vitally stable and alert and oriented on discussion today.  So far during the course of his admission the patient has received: 3 units FFP pre-procedure, a unit of platelets before the procedure and another after, and tranexamic acid 1 g IV pre procedure and then 1 g by continuous infusion over 6 hours per Dr. Starleen Arms recommendations.   #Unspecified Bleeding Disorder with Spinal Hematoma 2/2 to Fall --patient has had a considerable drop in Hgb in the perioperative period. Please continued to monitor Hgb at least q8hs to assure he does not drop to transfusion levels. Additionally would recommend stat CBC in the event of tachycardia/hypotension. --Hgb relatively stable from post operative check to approximately 12 hours later. The patient is having low levels of blood output into drain and bandages (reportedly 100cc blood in the drain) --transfuse for Hgb <7.0, administer 2 units of PRBC --if a second surgery is planned, recommend repeat regimen recommended by Dr. Jana Hakim noted above.  --if patient found to have uncontrollable bleeding, please page the Hematology team STAT. I can be reached at [phone] at any time. Would likely recommend novoseven given his prior borderline low FVII levels.  --Hematology will continue to follow.   #Lung Mass --CT Chest showed A 3.7 cm x 1.5 cm low-attenuation mass is seen along the posterior aspect of the left apex. An adjacent expansile lytic area is seen within the posterior aspect of the second left rib. This was not noted on a CT from 10/2019 or 03/2019.  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   All questions were answered. The patient knows to call the clinic with any problems, questions or concerns.  A total of more than 55 minutes were spent on this encounter and over half of that time was spent on counseling and coordination of care as outlined above.   Ledell Peoples, MD Department of Hematology/Oncology [place] [facility] Phone: [phone] Pager: [phone] Email: [email or web address]  05/14/2020 6:23 PM

## 2020-05-15 LAB — BPAM PLATELET PHERESIS
Blood Product Expiration Date: 202105222359
Blood Product Expiration Date: 202105222359
ISSUE DATE / TIME: 202105220033
ISSUE DATE / TIME: 202105220033
Unit Type and Rh: 5100
Unit Type and Rh: 9500

## 2020-05-15 LAB — PREPARE PLATELET PHERESIS
Unit division: 0
Unit division: 0

## 2020-05-15 LAB — CBC
HCT: 30.9 % — ABNORMAL LOW (ref 39.0–52.0)
HCT: 31.2 % — ABNORMAL LOW (ref 39.0–52.0)
HCT: 28.3 % — ABNORMAL LOW (ref 39.0–52.0)
HCT: 28.2 % — ABNORMAL LOW (ref 39.0–52.0)
Hemoglobin: 9.4 g/dL — ABNORMAL LOW (ref 13.0–17.0)
Hemoglobin: 9.6 g/dL — ABNORMAL LOW (ref 13.0–17.0)
Hemoglobin: 8.6 g/dL — ABNORMAL LOW (ref 13.0–17.0)
Hemoglobin: 8.5 g/dL — ABNORMAL LOW (ref 13.0–17.0)
MCH: 27.7 pg (ref 26.0–34.0)
MCH: 28.5 pg (ref 26.0–34.0)
MCH: 28 pg (ref 26.0–34.0)
MCH: 27.8 pg (ref 26.0–34.0)
MCHC: 30.4 g/dL (ref 30.0–36.0)
MCHC: 30.8 g/dL (ref 30.0–36.0)
MCHC: 30.4 g/dL (ref 30.0–36.0)
MCHC: 30.1 g/dL (ref 30.0–36.0)
MCV: 91.2 fL (ref 80.0–100.0)
MCV: 92.6 fL (ref 80.0–100.0)
MCV: 92.2 fL (ref 80.0–100.0)
MCV: 92.2 fL (ref 80.0–100.0)
Platelets: 175 10*3/uL (ref 150–400)
Platelets: 170 10*3/uL (ref 150–400)
Platelets: 213 10*3/uL (ref 150–400)
Platelets: 207 10*3/uL (ref 150–400)
RBC: 3.39 MIL/uL — ABNORMAL LOW (ref 4.22–5.81)
RBC: 3.37 MIL/uL — ABNORMAL LOW (ref 4.22–5.81)
RBC: 3.07 MIL/uL — ABNORMAL LOW (ref 4.22–5.81)
RBC: 3.06 MIL/uL — ABNORMAL LOW (ref 4.22–5.81)
RDW: 19.5 % — ABNORMAL HIGH (ref 11.5–15.5)
RDW: 19.7 % — ABNORMAL HIGH (ref 11.5–15.5)
RDW: 19.7 % — ABNORMAL HIGH (ref 11.5–15.5)
RDW: 19.6 % — ABNORMAL HIGH (ref 11.5–15.5)
WBC: 8.4 10*3/uL (ref 4.0–10.5)
WBC: 8.6 10*3/uL (ref 4.0–10.5)
WBC: 8 10*3/uL (ref 4.0–10.5)
WBC: 7.8 10*3/uL (ref 4.0–10.5)
nRBC: 0 % (ref 0.0–0.2)
nRBC: 0.2 % (ref 0.0–0.2)
nRBC: 0.4 % — ABNORMAL HIGH (ref 0.0–0.2)
nRBC: 0.4 % — ABNORMAL HIGH (ref 0.0–0.2)

## 2020-05-15 LAB — GLUCOSE, CAPILLARY
Glucose-Capillary: 101 mg/dL — ABNORMAL HIGH (ref 70–99)
Glucose-Capillary: 63 mg/dL — ABNORMAL LOW (ref 70–99)
Glucose-Capillary: 96 mg/dL (ref 70–99)
Glucose-Capillary: 118 mg/dL — ABNORMAL HIGH (ref 70–99)
Glucose-Capillary: 97 mg/dL (ref 70–99)
Glucose-Capillary: 79 mg/dL (ref 70–99)

## 2020-05-15 LAB — SURGICAL PCR SCREEN
MRSA, PCR: NEGATIVE
Staphylococcus aureus: NEGATIVE

## 2020-05-15 MED ORDER — ORAL CARE MOUTH RINSE
15.0000 mL | Freq: Two times a day (BID) | OROMUCOSAL | Status: DC
  Administered 2020-05-15 – 2020-05-16 (×2): 15 mL via OROMUCOSAL

## 2020-05-15 MED ORDER — PHENYLEPHRINE HCL-NACL 10-0.9 MG/250ML-% IV SOLN
0.0000 ug/min | INTRAVENOUS | Status: DC
  Administered 2020-05-15: 40 ug/min via INTRAVENOUS
  Administered 2020-05-16: 55 ug/min via INTRAVENOUS
  Administered 2020-05-16: 45 ug/min via INTRAVENOUS
  Administered 2020-05-16: 55 ug/min via INTRAVENOUS
  Filled 2020-05-15 (×2): qty 250
  Filled 2020-05-15 (×2): qty 500

## 2020-05-15 MED ORDER — PHENYLEPHRINE HCL-NACL 10-0.9 MG/250ML-% IV SOLN
INTRAVENOUS | Status: AC
  Administered 2020-05-15: 10 ug/min via INTRAVENOUS
  Filled 2020-05-15: qty 250

## 2020-05-15 MED ORDER — MUPIROCIN 2 % EX OINT: 1.0000 "application " | TOPICAL_OINTMENT | Freq: Two times a day (BID) | CUTANEOUS | Status: DC

## 2020-05-15 MED ORDER — CHLORHEXIDINE GLUCONATE 0.12 % MT SOLN
15.0000 mL | Freq: Two times a day (BID) | OROMUCOSAL | Status: DC
  Administered 2020-05-16: 15 mL via OROMUCOSAL
  Filled 2020-05-15: qty 15

## 2020-05-15 MED ORDER — SODIUM CHLORIDE 0.9% IV SOLUTION: Freq: Once | INTRAVENOUS | Status: AC

## 2020-05-15 NOTE — Progress Notes (Signed)
Patient ID: Louis [place]., male   DOB: October 08, 1946, 74 y.o.   MRN: [id number] BP 113/85   Pulse 89   Temp 98 F (36.7 C) (Axillary)   Resp 16   Ht 5\' 11"  (1.803 m)   Wt 87 kg   SpO2 100%   BMI 26.75 kg/m  Alert, confused, following commands Moving both lower extremities better today Wound is clean, dressing saturated, now changed. Drain removed. Plan on hardware tomorrow.  Will transfuse platelets, and ffp Awaiting pathology

## 2020-05-15 NOTE — Consult Note (Signed)
NAME:  Louis Rorie., MRN:  [id number], DOB:  Jun 02, 1946, LOS: 2 ADMISSION DATE:  05/19/2020, CONSULTATIO care unit N DATE:  05/15/20 REFERRING MD:  Neurosurgery, CHIEF COMPLAINT:   Fall  Brief History   74 year old male who presented to the emergency department 5/21 after a fall while walking to the bathroom was found to have pathologic compression fracture of T6 and large volume spinal epidural hemorrhage requiring hematoma evacuation and thoracic laminectomy.  He was intubated for procedure and transferred to the intensive care unit postop.  History of present illness   74 year old male with a history of hypertension, hyperlipidemia, CAD s/p CABG 2011, ischemic cardiomyopathy HFrEF, AICD, atrial fibrillation, CVA, seizure disorder, factor VII deficiency, not clearly diagnosed bleeding diathesis despite extensive work-up who was walking to the bathroom with his walker when he lost his balance and fell to seated position after his legs just gave out on him.  He was having significant pain on presentation to the ED and was found to have pathologic compression fracture of T6  large volume of Spinal Epidural Hemorrhage spanning from the cervical spine throughout the thoracic spine to the conus.  Widespread spinal cord mass-effect.  Neurosurgery and hematology evaluated, recommendation for 3 units FFP, 1 unit of platelets and 1 g of TXA  preprocedure, 1 unit platelets, 1 g TXA post procedure with additional FFP depending on volume status.  Neurosurgery performed T6 laminectomy and epidural clot evacuation.  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   Micro Data:  5/21 MRSA screen>> negative 5/21 SARS-Cov-2>> negative 5/21 urine culture>>   Antimicrobials:  Ceftriaxone 5/21-  Interim history/subjective:  Patient is awake and following commands.  Pain at incision site with movement.  Objective   Blood pressure (!) 89/53, pulse (!) 52, temperature 98.1 F (36.7 C), temperature source Axillary, resp. rate (!) 21, height _0  (1.803 m), weight 87 kg, SpO2 100 %.        Intake/Output Summary (Last 24 hours) at 05/15/2020 1853 Last data filed at 05/15/2020 1800 Gross per 24 hour  Intake 2648.77 ml  Output 695 ml  Net 1953.77 ml   [facility]  05/14/20 1200  Weight: 87 kg    General: Elderly male, lying comfortably in bed. HEENT: MM pink/moist,  Neuro: Pupils 2 mm equal and reactive to light, breathing over the vent, 4/5 strength upper extremities.  Improving strength in lower extremities.  Still weaker on the right side. CV: s1s2 RRR, no m/r/g PULM: CTA B.   GI: soft, bsx4 active  Extremities: warm/dry, no edema  Skin: no rashes or lesions   Resolved Hospital Problem list     Assessment & Plan:   Was critically ill due to respiratory failure requiring mechanical ventilation. -Pulmonary toilet.  Fall with pathologic T6 fracture and epidural hematoma causing cord compression Status post T6 laminectomy  -Management per neurosurgery   Bleeding diathesis, without definitive diagnosis, epidural tumor noted intraoperatively.  Received factor transfusions intraoperatively. -Extensive work-up by Dr. Irene Limbo and at [facility] by [name] -Work-up for possible multiple myeloma per hematology -pathology pending -Consider TXA infusion if requires further OR -Empirically transfuse FFP. -Consider tag to better define clotting disorder.   History of seizure disorder Sound like both tonic-clonic, followed by neurology  HFrEF, CAD s/p CABG, AICD, atrial fibrillation, hypertension, hyperlipidemia Last echocardiogram in 03/2020 with EF of 30 to 35%, not chronically anti-coagulated given bleeding disorder -Resume Coreg, amiodarone once extubated. -Continue Lasix, monitor volume status and I&O's   Best practice:  Diet: Swallow screen and advance diet Pain/Anxiety/Delirium protocol (if indicated): As needed oxycodone only VAP protocol (if indicated): HOB 30 degrees, daily SBT DVT prophylaxis: SCDs.  No heparin given bleeding diathesis GI prophylaxis: Protonix Glucose control: SSI Mobility: Bedrest Code Status: Full code Family Communication: Per primary Disposition: ICU  Labs   CBC: Recent Labs  Lab 04/27/2020 1531  05/22/2020 1551 05/14/20 1803 05/15/20 0152 05/15/20 1030 05/15/20 1330 05/15/20 1717  WBC 3.5*   < > 6.7 8.4 8.6 8.0 7.8  NEUTROABS 2.6  --   --   --   --   --   --   HGB 12.6*   < > 9.2* 9.4* 9.6* 8.6* 8.5*  HCT 41.5   < > 30.5* 30.9* 31.2* 28.3* 28.2*  MCV 91.4   < > 91.9 91.2 92.6 92.2 92.2  PLT 191   < > 191 175 170 213 207   < > = values in this interval not displayed.    Basic Metabolic Panel: Recent Labs  Lab 05/15/2020 1531 04/24/2020 1551 05/14/20 0114 05/14/20 0159 05/14/20 0336 05/14/20 0654  NA 135 139 141 140 141  --   K 4.0 3.6 3.9 3.8 3.6  --   CL 104 104  --   --   --   --   CO2 23  --   --   --   --   --   GLUCOSE 129* 122*  --   --   --   --   BUN 36* 41*  --   --   --   --   CREATININE 1.51* 1.40*  --   --   --   --   CALCIUM 8.4*  --   --   --   --   --   MG  --   --   --   --   --  1.5*  PHOS  --   --   --   --   --  4.1   GFR: Estimated Creatinine Clearance: 51.5 mL/min (A) (by C-G formula based on SCr of 1.4 mg/dL (H)). Recent Labs  Lab 05/15/20 [phone]  05/15/20 1030 05/15/20 1330 05/15/20 1717  WBC 8.4 8.6 8.0 7.8    Liver Function Tests: Recent Labs  Lab 05/23/2020 1531  AST 36  ALT 28  ALKPHOS 99  BILITOT 1.0  PROT 10.8*  ALBUMIN 2.9*   No results for input(s): LIPASE, AMYLASE in the last 168 hours. No results for input(s): AMMONIA in the last 168 hours.  ABG    Component Value Date/Time   PHART 7.584 (H) 05/14/2020 0336   PCO2ART 21.8 (L) 05/14/2020 0336   PO2ART 154 (H) 05/14/2020 0336   HCO3 21.2 05/14/2020 0336   TCO2 22 05/14/2020 0336   ACIDBASEDEF 1.0 05/14/2020 0159   O2SAT 100.0 05/14/2020 0336     Coagulation Profile: Recent Labs  Lab 05/11/2020 1531 05/14/20 0654  INR 1.4* 1.3*    Cardiac Enzymes: No results for input(s): CKTOTAL, CKMB, CKMBINDEX, TROPONINI in the last 168 hours.  HbA1C: Hgb A1c MFr Bld  Date/Time Value Ref Range Status  05/14/2020 06:54 AM 6.1 (H) 4.8 - 5.6 % Final    Comment:     (NOTE) Pre diabetes:          5.7%-6.4% Diabetes:              >6.4% Glycemic control for   <7.0% adults with diabetes   07/25/2019 08:22 PM 5.2 4.8 - 5.6 % Final    Comment:    (NOTE) Pre diabetes:          5.7%-6.4% Diabetes:              >6.4% Glycemic control for   <7.0% adults with diabetes     CBG: Recent Labs  Lab 05/15/20 0337 05/15/20 0353 05/15/20 0739 05/15/20 1148 05/15/20 1704  GLUCAP 63* 101* 96 [place]     Louis Brood, MD

## 2020-05-15 NOTE — Progress Notes (Signed)
PT Cancellation Note  Patient Details Name: [facility]. MRN: [id number] DOB: 04-Oct-1946   Cancelled Treatment:    Reason Eval/Treat Not Completed: Medical issues which prohibited therapy. Pt continuing to await spine stabilization surgery. PT will hold until surgery is complete and pt is medically appropriate to mobilize with PT.   Zenaida Niece 05/15/2020, 7:57 AM

## 2020-05-15 NOTE — Progress Notes (Signed)
OT Cancellation Note  Patient Details Name: Louis Ford. MRN: [id number] DOB: 1946/08/03   Cancelled Treatment:    Reason Eval/Treat Not Completed: Patient not medically ready;Other (comment)(Pt still awaiting T spine sx. Will hold until this is complete to evaluate pt.)  Zenovia Jarred, MSOT, OTR/L [facility] Office Number: [phone] Pager: [place] 05/15/2020, 10:59 AM

## 2020-05-16 ENCOUNTER — Inpatient Hospital Stay (HOSPITAL_COMMUNITY): Payer: Medicare Other

## 2020-05-16 ENCOUNTER — Encounter (HOSPITAL_COMMUNITY): Admission: EM | Disposition: E | Payer: Self-pay | Source: Home / Self Care | Attending: Neurosurgery

## 2020-05-16 ENCOUNTER — Encounter (HOSPITAL_COMMUNITY): Payer: Self-pay | Admitting: Neurosurgery

## 2020-05-16 ENCOUNTER — Inpatient Hospital Stay (HOSPITAL_COMMUNITY): Payer: Medicare Other | Admitting: Certified Registered"

## 2020-05-16 DIAGNOSIS — C9000 Multiple myeloma not having achieved remission: Secondary | ICD-10-CM

## 2020-05-16 DIAGNOSIS — C7951 Secondary malignant neoplasm of bone: Secondary | ICD-10-CM

## 2020-05-16 HISTORY — PX: LAMINECTOMY WITH POSTERIOR LATERAL ARTHRODESIS LEVEL 4: SHX6338

## 2020-05-16 LAB — CBC
HCT: 27.8 % — ABNORMAL LOW (ref 39.0–52.0)
HCT: 28.3 % — ABNORMAL LOW (ref 39.0–52.0)
HCT: 27.8 % — ABNORMAL LOW (ref 39.0–52.0)
Hemoglobin: 8.4 g/dL — ABNORMAL LOW (ref 13.0–17.0)
Hemoglobin: 8.7 g/dL — ABNORMAL LOW (ref 13.0–17.0)
Hemoglobin: 8.8 g/dL — ABNORMAL LOW (ref 13.0–17.0)
MCH: 28.1 pg (ref 26.0–34.0)
MCH: 28.6 pg (ref 26.0–34.0)
MCH: 29 pg (ref 26.0–34.0)
MCHC: 30.2 g/dL (ref 30.0–36.0)
MCHC: 30.7 g/dL (ref 30.0–36.0)
MCHC: 31.7 g/dL (ref 30.0–36.0)
MCV: 93 fL (ref 80.0–100.0)
MCV: 93.1 fL (ref 80.0–100.0)
MCV: 91.7 fL (ref 80.0–100.0)
Platelets: 208 10*3/uL (ref 150–400)
Platelets: 202 10*3/uL (ref 150–400)
Platelets: 179 10*3/uL (ref 150–400)
RBC: 2.99 MIL/uL — ABNORMAL LOW (ref 4.22–5.81)
RBC: 3.04 MIL/uL — ABNORMAL LOW (ref 4.22–5.81)
RBC: 3.03 MIL/uL — ABNORMAL LOW (ref 4.22–5.81)
RDW: 19.9 % — ABNORMAL HIGH (ref 11.5–15.5)
RDW: 19.9 % — ABNORMAL HIGH (ref 11.5–15.5)
RDW: 18.2 % — ABNORMAL HIGH (ref 11.5–15.5)
WBC: 8 10*3/uL (ref 4.0–10.5)
WBC: 7.9 10*3/uL (ref 4.0–10.5)
WBC: 6.1 10*3/uL (ref 4.0–10.5)
nRBC: 0.6 % — ABNORMAL HIGH (ref 0.0–0.2)
nRBC: 1 % — ABNORMAL HIGH (ref 0.0–0.2)
nRBC: 1.5 % — ABNORMAL HIGH (ref 0.0–0.2)

## 2020-05-16 LAB — POCT I-STAT 7, (LYTES, BLD GAS, ICA,H+H)
Acid-base deficit: 2 mmol/L (ref 0.0–2.0)
Bicarbonate: 24 mmol/L (ref 20.0–28.0)
Calcium, Ion: 1.1 mmol/L — ABNORMAL LOW (ref 1.15–1.40)
HCT: 27 % — ABNORMAL LOW (ref 39.0–52.0)
Hemoglobin: 9.2 g/dL — ABNORMAL LOW (ref 13.0–17.0)
O2 Saturation: 93 %
Patient temperature: 36.9
Potassium: 4.4 mmol/L (ref 3.5–5.1)
Sodium: 145 mmol/L (ref 135–145)
TCO2: 25 mmol/L (ref 22–32)
pCO2 arterial: 45.1 mmHg (ref 32.0–48.0)
pH, Arterial: 7.334 — ABNORMAL LOW (ref 7.350–7.450)
pO2, Arterial: 73 mmHg — ABNORMAL LOW (ref 83.0–108.0)

## 2020-05-16 LAB — GLUCOSE, CAPILLARY
Glucose-Capillary: 120 mg/dL — ABNORMAL HIGH (ref 70–99)
Glucose-Capillary: 126 mg/dL — ABNORMAL HIGH (ref 70–99)
Glucose-Capillary: 73 mg/dL (ref 70–99)
Glucose-Capillary: 78 mg/dL (ref 70–99)
Glucose-Capillary: 91 mg/dL (ref 70–99)
Glucose-Capillary: 98 mg/dL (ref 70–99)

## 2020-05-16 LAB — PREPARE PLATELET PHERESIS: Unit division: 0

## 2020-05-16 LAB — PREPARE FRESH FROZEN PLASMA: Unit division: 0

## 2020-05-16 LAB — BPAM FFP
Blood Product Expiration Date: 202105282359
ISSUE DATE / TIME: 202105231113
Unit Type and Rh: 5100

## 2020-05-16 LAB — BPAM PLATELET PHERESIS
Blood Product Expiration Date: 202105262359
ISSUE DATE / TIME: 202105231113
Unit Type and Rh: 5100

## 2020-05-16 LAB — APTT: aPTT: 55 seconds — ABNORMAL HIGH (ref 24–36)

## 2020-05-16 LAB — PROTIME-INR
INR: 1.4 — ABNORMAL HIGH (ref 0.8–1.2)
Prothrombin Time: 16.8 seconds — ABNORMAL HIGH (ref 11.4–15.2)

## 2020-05-16 LAB — PREPARE RBC (CROSSMATCH)

## 2020-05-16 SURGERY — LAMINECTOMY WITH POSTERIOR LATERAL ARTHRODESIS LEVEL 4
Anesthesia: General | Site: Spine Thoracic

## 2020-05-16 MED ORDER — ORAL CARE MOUTH RINSE
15.0000 mL | OROMUCOSAL | Status: DC
  Administered 2020-05-16 – 2020-05-18 (×20): 15 mL via OROMUCOSAL

## 2020-05-16 MED ORDER — SODIUM CHLORIDE 0.9 % IV SOLN
10.0000 mL/h | Freq: Once | INTRAVENOUS | Status: AC
  Administered 2020-05-20: 10 mL/h via INTRAVENOUS

## 2020-05-16 MED ORDER — CHLORHEXIDINE GLUCONATE 0.12% ORAL RINSE (MEDLINE KIT)
15.0000 mL | Freq: Two times a day (BID) | OROMUCOSAL | Status: DC
  Administered 2020-05-16 – 2020-06-02 (×34): 15 mL via OROMUCOSAL

## 2020-05-16 MED ORDER — VASOPRESSIN 20 UNIT/ML IV SOLN
INTRAVENOUS | Status: AC
  Filled 2020-05-16: qty 1

## 2020-05-16 MED ORDER — LIDOCAINE-EPINEPHRINE 0.5 %-1:200000 IJ SOLN
INTRAMUSCULAR | Status: AC
  Filled 2020-05-16: qty 1

## 2020-05-16 MED ORDER — KCL IN DEXTROSE-NACL 20-5-0.9 MEQ/L-%-% IV SOLN
INTRAVENOUS | Status: DC
  Filled 2020-05-16 (×7): qty 1000

## 2020-05-16 MED ORDER — ETOMIDATE 2 MG/ML IV SOLN
INTRAVENOUS | Status: AC
  Filled 2020-05-16: qty 10

## 2020-05-16 MED ORDER — SODIUM CHLORIDE 0.9 % IV SOLN
250.0000 mL | INTRAVENOUS | Status: DC
  Administered 2020-05-23: 250 mL via INTRAVENOUS

## 2020-05-16 MED ORDER — TRANEXAMIC ACID 1000 MG/10ML IV SOLN
INTRAVENOUS | Status: DC | PRN
  Administered 2020-05-16: 1000 mg via INTRAVENOUS

## 2020-05-16 MED ORDER — ROCURONIUM BROMIDE 10 MG/ML (PF) SYRINGE
PREFILLED_SYRINGE | INTRAVENOUS | Status: AC
  Filled 2020-05-16: qty 20

## 2020-05-16 MED ORDER — TRANEXAMIC ACID-NACL 1000-0.7 MG/100ML-% IV SOLN
INTRAVENOUS | Status: AC
  Filled 2020-05-16: qty 100

## 2020-05-16 MED ORDER — LIDOCAINE 2% (20 MG/ML) 5 ML SYRINGE
INTRAMUSCULAR | Status: DC | PRN
  Administered 2020-05-16: 80 mg via INTRAVENOUS

## 2020-05-16 MED ORDER — NOREPINEPHRINE 4 MG/250ML-% IV SOLN
INTRAVENOUS | Status: AC
  Filled 2020-05-16: qty 250

## 2020-05-16 MED ORDER — LIDOCAINE 2% (20 MG/ML) 5 ML SYRINGE
INTRAMUSCULAR | Status: AC
  Filled 2020-05-16: qty 5

## 2020-05-16 MED ORDER — SODIUM CHLORIDE 0.9% FLUSH: 10.0000 mL | INTRAVENOUS | Status: DC | PRN

## 2020-05-16 MED ORDER — THROMBIN 5000 UNITS EX SOLR: OROMUCOSAL | Status: DC | PRN

## 2020-05-16 MED ORDER — THROMBIN 20000 UNITS EX SOLR: CUTANEOUS | Status: DC | PRN

## 2020-05-16 MED ORDER — CEFAZOLIN SODIUM 1 G IJ SOLR
INTRAMUSCULAR | Status: AC
  Filled 2020-05-16: qty 40

## 2020-05-16 MED ORDER — CEFAZOLIN SODIUM-DEXTROSE 2-3 GM-%(50ML) IV SOLR
INTRAVENOUS | Status: DC | PRN
  Administered 2020-05-16: 2 g via INTRAVENOUS

## 2020-05-16 MED ORDER — SODIUM CHLORIDE (PF) 0.9 % IJ SOLN
INTRAMUSCULAR | Status: AC
  Filled 2020-05-16: qty 20

## 2020-05-16 MED ORDER — LEVETIRACETAM 500 MG PO TABS
1000.0000 mg | ORAL_TABLET | Freq: Two times a day (BID) | ORAL | Status: DC
  Filled 2020-05-16: qty 2

## 2020-05-16 MED ORDER — LORAZEPAM 2 MG/ML IJ SOLN
2.0000 mg | Freq: Once | INTRAMUSCULAR | Status: DC
  Filled 2020-05-16 (×2): qty 1

## 2020-05-16 MED ORDER — PHENYTOIN 125 MG/5ML PO SUSP
100.0000 mg | Freq: Two times a day (BID) | ORAL | Status: DC
  Administered 2020-05-16 – 2020-05-18 (×4): 100 mg
  Filled 2020-05-16 (×5): qty 4

## 2020-05-16 MED ORDER — NOREPINEPHRINE 4 MG/250ML-% IV SOLN
INTRAVENOUS | Status: DC | PRN
  Administered 2020-05-16: 4 ug/min via INTRAVENOUS

## 2020-05-16 MED ORDER — FENTANYL CITRATE (PF) 250 MCG/5ML IJ SOLN
INTRAMUSCULAR | Status: AC
  Filled 2020-05-16: qty 5

## 2020-05-16 MED ORDER — LEVETIRACETAM IN NACL 1000 MG/100ML IV SOLN
1000.0000 mg | Freq: Once | INTRAVENOUS | Status: AC
  Administered 2020-05-16: 1000 mg via INTRAVENOUS
  Filled 2020-05-16: qty 100

## 2020-05-16 MED ORDER — BUPIVACAINE HCL 0.5 % IJ SOLN
INTRAMUSCULAR | Status: DC | PRN
  Administered 2020-05-16: 30 mL

## 2020-05-16 MED ORDER — NOREPINEPHRINE 4 MG/250ML-% IV SOLN
0.0000 ug/min | INTRAVENOUS | Status: DC
  Administered 2020-05-16 – 2020-05-18 (×2): 4 ug/min via INTRAVENOUS
  Administered 2020-05-20: 6 ug/min via INTRAVENOUS
  Administered 2020-05-20 – 2020-05-21 (×2): 9 ug/min via INTRAVENOUS
  Administered 2020-05-21: 5 ug/min via INTRAVENOUS
  Administered 2020-05-22: 14 ug/min via INTRAVENOUS
  Administered 2020-05-22 (×3): 15 ug/min via INTRAVENOUS
  Administered 2020-05-23: 6 ug/min via INTRAVENOUS
  Administered 2020-05-23: 12 ug/min via INTRAVENOUS
  Administered 2020-05-23: 11 ug/min via INTRAVENOUS
  Administered 2020-05-24: 8 ug/min via INTRAVENOUS
  Administered 2020-05-24: 5 ug/min via INTRAVENOUS
  Administered 2020-05-25: 16 ug/min via INTRAVENOUS
  Administered 2020-05-25: 19 ug/min via INTRAVENOUS
  Administered 2020-05-25: 13 ug/min via INTRAVENOUS
  Administered 2020-05-25 (×2): 19 ug/min via INTRAVENOUS
  Administered 2020-05-26: 15 ug/min via INTRAVENOUS
  Administered 2020-05-26: 22 ug/min via INTRAVENOUS
  Administered 2020-05-26: 15 ug/min via INTRAVENOUS
  Administered 2020-05-26: 13 ug/min via INTRAVENOUS
  Administered 2020-05-26: 15 ug/min via INTRAVENOUS
  Administered 2020-05-27: 13 ug/min via INTRAVENOUS
  Administered 2020-05-27: 30 ug/min via INTRAVENOUS
  Administered 2020-05-27: 12 ug/min via INTRAVENOUS
  Administered 2020-05-27 (×2): 35 ug/min via INTRAVENOUS
  Administered 2020-05-27: 30 ug/min via INTRAVENOUS
  Filled 2020-05-16: qty 500
  Filled 2020-05-16 (×29): qty 250

## 2020-05-16 MED ORDER — TRANEXAMIC ACID 1000 MG/10ML IV SOLN
1.5000 mg/kg/h | INTRAVENOUS | Status: DC
  Filled 2020-05-16: qty 25

## 2020-05-16 MED ORDER — SODIUM CHLORIDE 0.9% FLUSH
10.0000 mL | Freq: Two times a day (BID) | INTRAVENOUS | Status: DC
  Administered 2020-05-16 – 2020-05-18 (×5): 10 mL
  Administered 2020-05-19: 20 mL
  Administered 2020-05-19 – 2020-05-21 (×4): 10 mL
  Administered 2020-05-22: 20 mL
  Administered 2020-05-22 – 2020-05-24 (×4): 10 mL
  Administered 2020-05-24: 20 mL

## 2020-05-16 MED ORDER — TRANEXAMIC ACID-NACL 1000-0.7 MG/100ML-% IV SOLN
INTRAVENOUS | Status: DC | PRN
  Administered 2020-05-16: 1000 mg via INTRAVENOUS

## 2020-05-16 MED ORDER — THROMBIN 20000 UNITS EX SOLR
CUTANEOUS | Status: AC
  Filled 2020-05-16: qty 20000

## 2020-05-16 MED ORDER — ARTIFICIAL TEARS OPHTHALMIC OINT
TOPICAL_OINTMENT | OPHTHALMIC | Status: AC
  Filled 2020-05-16: qty 3.5

## 2020-05-16 MED ORDER — ARTIFICIAL TEARS OPHTHALMIC OINT
TOPICAL_OINTMENT | OPHTHALMIC | Status: DC | PRN
  Administered 2020-05-16: 1 via OPHTHALMIC

## 2020-05-16 MED ORDER — VASOPRESSIN 20 UNIT/ML IV SOLN
INTRAVENOUS | Status: DC | PRN
  Administered 2020-05-16 (×4): 2 [IU] via INTRAVENOUS
  Administered 2020-05-16: 1 [IU] via INTRAVENOUS

## 2020-05-16 MED ORDER — FENTANYL CITRATE (PF) 250 MCG/5ML IJ SOLN
INTRAMUSCULAR | Status: DC | PRN
  Administered 2020-05-16: 50 ug via INTRAVENOUS
  Administered 2020-05-16: 100 ug via INTRAVENOUS

## 2020-05-16 MED ORDER — SODIUM CHLORIDE 0.9% IV SOLUTION: Freq: Once | INTRAVENOUS | Status: DC

## 2020-05-16 MED ORDER — TRANEXAMIC ACID 1000 MG/10ML IV SOLN: INTRAVENOUS | Status: DC | PRN

## 2020-05-16 MED ORDER — MAGNESIUM SULFATE 4 GM/100ML IV SOLN
4.0000 g | Freq: Once | INTRAVENOUS | Status: AC
  Administered 2020-05-16: 4 g via INTRAVENOUS
  Filled 2020-05-16: qty 100

## 2020-05-16 MED ORDER — BUPIVACAINE HCL (PF) 0.5 % IJ SOLN
INTRAMUSCULAR | Status: AC
  Filled 2020-05-16: qty 30

## 2020-05-16 MED ORDER — PROPOFOL 500 MG/50ML IV EMUL
INTRAVENOUS | Status: DC | PRN
  Administered 2020-05-16: 50 ug/kg/min via INTRAVENOUS

## 2020-05-16 MED ORDER — ROCURONIUM BROMIDE 10 MG/ML (PF) SYRINGE
PREFILLED_SYRINGE | INTRAVENOUS | Status: DC | PRN
  Administered 2020-05-16: 60 mg via INTRAVENOUS
  Administered 2020-05-16: 40 mg via INTRAVENOUS
  Administered 2020-05-16: 100 mg via INTRAVENOUS

## 2020-05-16 MED ORDER — TRANEXAMIC ACID 1000 MG/10ML IV SOLN: 1000.0000 mg | Freq: Once | INTRAVENOUS | Status: DC

## 2020-05-16 MED ORDER — TRANEXAMIC ACID-NACL 1000-0.7 MG/100ML-% IV SOLN: 1000.0000 mg | Freq: Once | INTRAVENOUS | Status: DC

## 2020-05-16 MED ORDER — 0.9 % SODIUM CHLORIDE (POUR BTL) OPTIME
TOPICAL | Status: DC | PRN
  Administered 2020-05-16: 1000 mL

## 2020-05-16 MED ORDER — MORPHINE SULFATE (PF) 4 MG/ML IV SOLN
4.0000 mg | INTRAVENOUS | Status: DC | PRN
  Administered 2020-05-17 – 2020-05-18 (×6): 4 mg via INTRAVENOUS
  Filled 2020-05-16 (×6): qty 1

## 2020-05-16 MED ORDER — DEXAMETHASONE SODIUM PHOSPHATE 10 MG/ML IJ SOLN
INTRAMUSCULAR | Status: DC | PRN
  Administered 2020-05-16: 10 mg via INTRAVENOUS

## 2020-05-16 MED ORDER — DEXAMETHASONE SODIUM PHOSPHATE 10 MG/ML IJ SOLN
INTRAMUSCULAR | Status: AC
  Filled 2020-05-16: qty 1

## 2020-05-16 MED ORDER — ETOMIDATE 2 MG/ML IV SOLN
INTRAVENOUS | Status: DC | PRN
  Administered 2020-05-16: 12 mg via INTRAVENOUS

## 2020-05-16 MED ORDER — AMIODARONE HCL 200 MG PO TABS
200.0000 mg | ORAL_TABLET | Freq: Two times a day (BID) | ORAL | Status: DC
  Filled 2020-05-16: qty 1

## 2020-05-16 MED ORDER — VERAPAMIL HCL 2.5 MG/ML IV SOLN
5.0000 mg | INTRAVENOUS | Status: AC
  Administered 2020-05-16 (×2): 2.5 mg via INTRAVENOUS
  Filled 2020-05-16: qty 2

## 2020-05-16 MED ORDER — LACTATED RINGERS IV SOLN: INTRAVENOUS | Status: DC | PRN

## 2020-05-16 MED ORDER — THROMBIN 5000 UNITS EX SOLR
CUTANEOUS | Status: AC
  Filled 2020-05-16: qty 5000

## 2020-05-16 MED ORDER — AMIODARONE HCL 200 MG PO TABS
200.0000 mg | ORAL_TABLET | Freq: Two times a day (BID) | ORAL | Status: DC
  Administered 2020-05-16 – 2020-05-18 (×4): 200 mg
  Filled 2020-05-16 (×3): qty 1

## 2020-05-16 SURGICAL SUPPLY — 61 items
BAG DECANTER FOR FLEXI CONT (MISCELLANEOUS) ×3 IMPLANT
BUR MATCHSTICK NEURO 3.0 LAGG (BURR) ×3 IMPLANT
CANISTER SUCT 3000ML PPV (MISCELLANEOUS) ×3 IMPLANT
CAP LCK SPNE (Orthopedic Implant) ×8 IMPLANT
CAP LOCK SPINE RADIUS (Orthopedic Implant) IMPLANT
CAP LOCKING (Orthopedic Implant) ×16 IMPLANT
CARTRIDGE OIL MAESTRO DRILL (MISCELLANEOUS) ×1 IMPLANT
CLOSURE WOUND 1/2 X4 (GAUZE/BANDAGES/DRESSINGS)
CNTNR URN SCR LID CUP LEK RST (MISCELLANEOUS) ×1 IMPLANT
CONT SPEC 4OZ STRL OR WHT (MISCELLANEOUS) ×2
DERMABOND ADVANCED (GAUZE/BANDAGES/DRESSINGS) ×4
DERMABOND ADVANCED .7 DNX12 (GAUZE/BANDAGES/DRESSINGS) ×1 IMPLANT
DIFFUSER DRILL AIR PNEUMATIC (MISCELLANEOUS) ×3 IMPLANT
DRAPE C-ARM 42X72 X-RAY (DRAPES) ×6 IMPLANT
DRAPE LAPAROTOMY 100X72X124 (DRAPES) ×3 IMPLANT
DRAPE SURG 17X23 STRL (DRAPES) ×3 IMPLANT
DRSG OPSITE 4X5.5 SM (GAUZE/BANDAGES/DRESSINGS) ×2 IMPLANT
DRSG OPSITE POSTOP 4X8 (GAUZE/BANDAGES/DRESSINGS) ×2 IMPLANT
DURAPREP 26ML APPLICATOR (WOUND CARE) ×3 IMPLANT
ELECT BLADE 4.0 EZ CLEAN MEGAD (MISCELLANEOUS) ×3
ELECT REM PT RETURN 9FT ADLT (ELECTROSURGICAL) ×3
ELECTRODE BLDE 4.0 EZ CLN MEGD (MISCELLANEOUS) IMPLANT
ELECTRODE REM PT RTRN 9FT ADLT (ELECTROSURGICAL) ×1 IMPLANT
EVACUATOR 3/16  PVC DRAIN (DRAIN) ×2
EVACUATOR 3/16 PVC DRAIN (DRAIN) IMPLANT
GLOVE BIOGEL PI IND STRL 7.0 (GLOVE) IMPLANT
GLOVE BIOGEL PI IND STRL 7.5 (GLOVE) IMPLANT
GLOVE BIOGEL PI INDICATOR 7.0 (GLOVE) ×6
GLOVE BIOGEL PI INDICATOR 7.5 (GLOVE) ×6
GLOVE ECLIPSE 6.5 STRL STRAW (GLOVE) ×6 IMPLANT
GLOVE ECLIPSE 7.5 STRL STRAW (GLOVE) ×6 IMPLANT
GLOVE EXAM NITRILE XL STR (GLOVE) IMPLANT
GOWN STRL REUS W/ TWL LRG LVL3 (GOWN DISPOSABLE) ×2 IMPLANT
GOWN STRL REUS W/ TWL XL LVL3 (GOWN DISPOSABLE) IMPLANT
GOWN STRL REUS W/TWL 2XL LVL3 (GOWN DISPOSABLE) ×4 IMPLANT
GOWN STRL REUS W/TWL LRG LVL3 (GOWN DISPOSABLE) ×4
GOWN STRL REUS W/TWL XL LVL3 (GOWN DISPOSABLE)
GRAFT BONE MAGNIFUSE 1X5 SNGL (Bone Implant) ×2 IMPLANT
HEMOSTAT POWDER KIT SURGIFOAM (HEMOSTASIS) ×2 IMPLANT
KIT BASIN OR (CUSTOM PROCEDURE TRAY) ×3 IMPLANT
KIT POSITION SURG JACKSON T1 (MISCELLANEOUS) ×3 IMPLANT
KIT TURNOVER KIT B (KITS) ×3 IMPLANT
NDL HYPO 25X1 1.5 SAFETY (NEEDLE) ×1 IMPLANT
NEEDLE HYPO 25X1 1.5 SAFETY (NEEDLE) ×3 IMPLANT
NS IRRIG 1000ML POUR BTL (IV SOLUTION) ×3 IMPLANT
OIL CARTRIDGE MAESTRO DRILL (MISCELLANEOUS) ×3
PACK LAMINECTOMY NEURO (CUSTOM PROCEDURE TRAY) ×3 IMPLANT
ROD 120MM (Rod) ×4 IMPLANT
ROD SPNL 120X5.5 NS TI RDS (Rod) IMPLANT
SCREW 4.75X40MM (Screw) ×4 IMPLANT
SCREW 5.75X40M (Screw) ×12 IMPLANT
SPONGE SURGIFOAM ABS GEL 100 (HEMOSTASIS) ×3 IMPLANT
STRIP CLOSURE SKIN 1/2X4 (GAUZE/BANDAGES/DRESSINGS) IMPLANT
SUT VIC AB 0 CT1 18XCR BRD8 (SUTURE) ×1 IMPLANT
SUT VIC AB 0 CT1 8-18 (SUTURE) ×2
SUT VIC AB 2-0 CT1 18 (SUTURE) ×3 IMPLANT
SUT VIC AB 3-0 SH 8-18 (SUTURE) ×3 IMPLANT
TAP SURGICAL HFR 5.25 (TAP) ×2 IMPLANT
TOWEL GREEN STERILE (TOWEL DISPOSABLE) ×3 IMPLANT
TOWEL GREEN STERILE FF (TOWEL DISPOSABLE) ×3 IMPLANT
WATER STERILE IRR 1000ML POUR (IV SOLUTION) ×3 IMPLANT

## 2020-05-16 NOTE — Progress Notes (Addendum)
HEMATOLOGY/ONCOLOGY NOTE  Date of Service: 05/19/2020  Patient Care Team: [name], MD as PCP - General (Internal Medicine) [name], MD as PCP - Electrophysiology (Cardiology) [name], [name], MD as PCP - Advanced Heart Failure (Cardiology) [name], MD as Consulting Physician (Neurology)  REFERRING PHYSICIAN: No ref. provider found  CHIEF COMPLAINTS/PURPOSE OF CONSULTATION:  Bleeding Disorder, Iron deficiency, Monoclonal Protein Spike  HISTORY OF PRESENTING ILLNESS:   [facility] is a 74 y.o. male referred to [place] for a bleeding disorder, iron deficiency anemia, and monoclonal protein spike.  Last seen in our office on 12/29/2019.  Missed an appointment with [place] in early April 2021.  Seen by [name] at [facility] at the end of January 2021 for his bleeding disorder.  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  There was also felt to be a defect in platelet function as well as slight deficiency in alcohol to antiplasmin.  He was felt to be very high risk for bleeding for any procedures.  Recommendations were for 1 to 2 units of platelets as well as antifibrinolytic's and a small amount of plasma for procedures.  Admitted following a fall with epidural hematoma to the thoracic spine, T6 compression fracture and paraplegia of the lower extremities.  Went to the OR on 05/14/2020 for thoracic laminectomy for hematoma evacuation.  He received 3 units FFP and 2 units of platelets in the perioperative period.  Had a drop in his hemoglobin postoperatively but had minimal blood on the dressing and drain output was standard for this type of surgery.  He is going back to the OR today for the fusion, involving pedicle screws from T4-T8.  He is somewhat somnolent at the time of my visit.  Unable to answer my questions.  No bleeding noted.  MEDICAL HISTORY:  Past Medical History:  Diagnosis Date  . AICD (automatic cardioverter/defibrillator)  present   . Atrial fibrillation   09/22/2012  . Blood loss anemia 04/18/2017   After GI bleed from colonoscopy and polypectomy  . CAD (coronary artery disease)   . Chronic systolic heart failure ([place])   . 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 GIB (gastrointestinal bleeding) 02/20/2017  . History of MRSA infection 05/2011  . HTN (hypertension)   . Hx of adenomatous colonic polyps 02/20/2017   01/2017 - 3 cm sigmoid TV adenoma and other smaller polyps - had post-polypectomy bleed Tx w/ clips Consider repeat colonoscopy 3 yrs [name], MD, [name]   . Hyperlipidemia   . implantable cardiac defibrillator-Biotronik    Device Implanted 2006; s/p gen change 03/2011 : bleeding persistent with pocket erosion and infection; explant and reimplant  06/2011  . Ischemic cardiomyopathy    EF 15 to 20% by TTE and TEE in 09/2012.  Severe LV dysfunction  . Obesity    BMI 31 in 09/2012  . Persistent atrial fibrillation with rapid ventricular response ([place]) 04/21/2014  . Retroperitoneal bleed 04/21/2019  . Seizure disorder ([place]) latest 09/30/2012  . Seizures ([place])   . Stroke [facility])      SURGICAL HISTORY: Past Surgical History:  Procedure Laterality Date  . ALVEOLOPLASTY N/A 07/26/2019   Procedure: ALVEOLOPLASTY WITH RESUTURING OF ORAL WOUND;  Surgeon: [name], [name];  Location: [facility] ORS;  Service: Oral Surgery;  Laterality: N/A;  . CARDIAC CATHETERIZATION N/A 10/12/2015   Procedure: Right Heart Cath;  Surgeon: [name], MD;  Location:  [place] INVASIVE CV LAB;  Service: Cardiovascular;  Laterality: N/A;  . CARDIOVERSION  09/24/2012   Procedure: CARDIOVERSION;  Surgeon: [name], MD;  Location: [facility] ENDOSCOPY;  Service: Cardiovascular;  Laterality: N/A;  . CARDIOVERSION N/A 12/25/2017   Procedure: CARDIOVERSION;  Surgeon: [name], MD;  Location: [facility] ORS;  Service: Cardiovascular;  Laterality: N/A;  . COLONOSCOPY W/ POLYPECTOMY  02/13/2017  .  CORONARY ARTERY BYPASS GRAFT  2011   in [place] N/A 08/12/2019   Procedure: CAUTERIZATION OF ORAL BLEEDING;  Surgeon: [name], [name];  Location: [facility];  Service: Oral Surgery;  Laterality: N/A;  . FLEXIBLE SIGMOIDOSCOPY N/A 02/20/2017   Procedure: FLEXIBLE SIGMOIDOSCOPY;  Surgeon: [name], MD;  Location: [facility] ENDOSCOPY;  Service: Endoscopy;  Laterality: N/A;  . FLEXIBLE SIGMOIDOSCOPY N/A 02/21/2017   Procedure: FLEXIBLE SIGMOIDOSCOPY;  Surgeon: [name], MD;  Location: [facility] ENDOSCOPY;  Service: Endoscopy;  Laterality: N/A;  . ICD    . ICD GENERATOR CHANGEOUT N/A 09/10/2019   Procedure: [place];  Surgeon: [name], MD;  Location: [place] CV LAB;  Service: Cardiovascular;  Laterality: N/A;  . ICD LEAD REMOVAL Right 11/13/2019   Procedure: ICD EXTRACTION and Lead Extraction;  Surgeon: [name], MD;  Location: [place];  Service: Cardiovascular;  Laterality: Right;  Dr. Servando Snare for backup  . POCKET REVISION/RELOCATION N/A 09/17/2019   Procedure: POCKET REVISION/RELOCATION;  Surgeon: Thompson Grayer, MD;  Location: [place] CV LAB;  Service: Cardiovascular;  Laterality: N/A;  . SUBMANDIBULAR GLAND EXCISION N/A 08/08/2019   Procedure: suture of oral wounds;  Surgeon: [name], [name];  Location: [facility]S;  Service: Oral Surgery;  Laterality: N/A;  . TEE WITHOUT CARDIOVERSION  09/24/2012   Procedure: TRANSESOPHAGEAL ECHOCARDIOGRAM (TEE);  Surgeon: [name], MD;  Location: [place];  Service: Cardiovascular;  Laterality: N/A;  dave/anesth, dl, cindy/echo   . TEE WITHOUT CARDIOVERSION N/A 11/12/2019   Procedure: TRANSESOPHAGEAL ECHOCARDIOGRAM (TEE);  Surgeon: [name], MD;  Location: [facility] ENDOSCOPY;  Service: Cardiovascular;  Laterality: N/A;  . TEE WITHOUT CARDIOVERSION N/A 11/13/2019   Procedure: TRANSESOPHAGEAL ECHOCARDIOGRAM (TEE);  Surgeon: [name], MD;  Location: [place];  Service: Cardiovascular;  Laterality: N/A;  .  THORACIC LAMINECTOMY FOR EPIDURAL ABSCESS N/A 05/09/2020   Procedure: THORACIC LAMINECTOMY FOR HEMATOMA EVACUATION;  Surgeon: [name], MD;  Location: [place];  Service: Neurosurgery;  Laterality: N/A;     SOCIAL HISTORY: Social History   Socioeconomic History  . Marital status: Married    Spouse name: Not on file  . Number of children: Not on file  . Years of education: Not on file  . Highest education level: Not on file  Occupational History  . Not on file  Tobacco Use  . Smoking status: Former Smoker    Types: Pipe    Quit date: 09/21/2008    Years since quitting: 11.6  . Smokeless tobacco: Former Systems developer    Quit date: 09/21/2008  Substance and Sexual Activity  . Alcohol use: No  . Drug use: No  . Sexual activity: Not Currently    Birth control/protection: None  Other Topics Concern  . Not on file  Social History Narrative   Right handed   Apartment ground floor   Drinks caffeine       Social Determinants of Health   Financial Resource Strain:   . Difficulty of Paying Living Expenses:   Food Insecurity:   . Worried About Running  Out of Food in the Last Year:   . [place] in the Last Year:   Transportation Needs:   . Lack of Transportation (Medical):   [name] Lack of Transportation (Non-Medical):   Physical Activity:   . Days of Exercise per Week:   . Minutes of Exercise per Session:   Stress:   . Feeling of Stress :   Social Connections:   . Frequency of Communication with Friends and Family:   . Frequency of Social Gatherings with Friends and Family:   . Attends Religious Services:   . Active Member of Clubs or Organizations:   . Attends Archivist Meetings:   [name] Marital Status:   Intimate Partner Violence:   . Fear of Current or Ex-Partner:   . Emotionally Abused:   [name] Physically Abused:   . Sexually Abused:      FAMILY HISTORY: Family History  Problem Relation Age of Onset  . Arthritis Mother   . Heart disease Mother   . Heart attack  Mother   . Other Father        smoker  . Hypertension Neg Hx        unknown  . Stroke Neg Hx        unknown     ALLERGIES:   has No Known Allergies.   MEDICATIONS:  Current Facility-Administered Medications  Medication Dose Route Frequency Provider Last Rate Last Admin  . 0.9 %  sodium chloride infusion (Manually program via Guardrails IV Fluids)   Intravenous Once [name], MD      . 0.9 %  sodium chloride infusion (Manually program via Guardrails IV Fluids)   Intravenous Once [name], MD      . 0.9 %  sodium chloride infusion  250 mL Intravenous Continuous [name], MD      . acetaminophen (TYLENOL) tablet 650 mg  650 mg Oral Q4H PRN [name], MD       Or  . acetaminophen (TYLENOL) suppository 650 mg  650 mg Rectal Q4H PRN [name], MD      . amiodarone (PACERONE) tablet 200 mg  200 mg Oral BID [name], MD   200 mg at 05/02/2020 0916  . carvedilol (COREG) tablet 3.125 mg  3.125 mg Oral BID WC [name], MD   Stopped at 05/14/20 [phone]  . chlorhexidine (PERIDEX) 0.12 % solution 15 mL  15 mL Mouth Rinse BID [name], MD   15 mL at 05/10/2020 0915  . Chlorhexidine Gluconate Cloth 2 % PADS 6 each  6 each Topical Daily Kipp Brood, MD   6 each at 05/06/2020 0000  . dextrose 5 % and 0.9 % NaCl with KCl 20 mEq/L infusion   Intravenous Continuous Kipp Brood, MD 80 mL/hr at 05/08/2020 1400 Rate Verify at 05/07/2020 1400  . diazepam (VALIUM) tablet 5 mg  5 mg Oral Q6H PRN [name], MD   5 mg at 05/15/20 1713  . divalproex (DEPAKOTE ER) 24 hr tablet 500 mg  500 mg Oral Daily [name], MD   500 mg at 05/13/2020 0916  . DULoxetine (CYMBALTA) DR capsule 30 mg  30 mg Oral Daily [name], MD   30 mg at 04/25/2020 0915  . furosemide (LASIX) tablet 40 mg  40 mg Oral Daily [name], MD   40 mg at 04/25/2020 0916  . insulin aspart (novoLOG) injection 0-9 Units  0-9 Units Subcutaneous Q4H Gleason, Otilio Carpen, PA-C      .  levETIRAcetam (KEPPRA) tablet 1,000  mg  1,000 mg Oral BID [name], MD   1,000 mg at 05/10/2020 0917  . LORazepam (ATIVAN) injection 2 mg  2 mg Intravenous Once Kipp Brood, MD      . MEDLINE mouth rinse  15 mL Mouth Rinse q12n4p [name], MD   15 mL at 05/19/2020 1209  . menthol-cetylpyridinium (CEPACOL) lozenge 3 mg  1 lozenge Oral PRN [name], MD       Or  . phenol (CHLORASEPTIC) mouth spray 1 spray  1 spray Mouth/Throat PRN [name], MD      . morphine 4 MG/ML injection 4 mg  4 mg Intravenous Q2H PRN Agarwala, Einar Grad, MD      . ondansetron (ZOFRAN) tablet 4 mg  4 mg Oral Q6H PRN [name], MD       Or  . ondansetron (ZOFRAN) injection 4 mg  4 mg Intravenous Q6H PRN [name], MD   4 mg at 05/14/20 1216  . oxyCODONE (Oxy IR/ROXICODONE) immediate release tablet 10 mg  10 mg Oral Q3H PRN [name], MD   10 mg at 05/19/2020 0915  . oxyCODONE (Oxy IR/ROXICODONE) immediate release tablet 5 mg  5 mg Oral Q3H PRN [name], MD      . pantoprazole (PROTONIX) EC tablet 40 mg  40 mg Oral Daily Kipp Brood, MD   40 mg at 05/15/20 1713  . phenylephrine (NEOSYNEPHRINE) 10-0.9 MG/250ML-% infusion  0-400 mcg/min Intravenous Titrated Kipp Brood, MD 15 mL/hr at 04/25/2020 1400 10 mcg/min at 05/09/2020 1400  . phenytoin (DILANTIN) ER capsule 100 mg  100 mg Oral BID [name], MD   100 mg at 05/05/2020 1017  . simvastatin (ZOCOR) tablet 20 mg  20 mg Oral Daily [name], MD   20 mg at 04/28/2020 0921  . sodium chloride flush (NS) 0.9 % injection 3 mL  3 mL Intravenous Q12H [name], MD   3 mL at 05/18/2020 0921  . sodium chloride flush (NS) 0.9 % injection 3 mL  3 mL Intravenous PRN [name], MD      . tamsulosin (FLOMAX) capsule 0.4 mg  0.4 mg Oral Daily [name], MD   0.4 mg at 05/12/2020 0916  . traMADol (ULTRAM) tablet 50 mg  50 mg Oral Q6H PRN [name], MD   50 mg at 05/15/20 1713     REVIEW OF SYSTEMS:   Unable to obtain a comprehensive review of systems secondary to patient  sedation.   PHYSICAL EXAMINATION: ECOG PERFORMANCE STATUS: 4 - Bedbound  Vitals:   05/01/2020 1230 05/22/2020 1245  BP: 110/67 102/68  Pulse:    Resp: (!) 22 (!) 23  Temp:    SpO2:     Filed Weights   05/14/20 1200  Weight: 87 kg   Body mass index is 26.75 kg/m.  GENERAL:alert, in no acute distress and comfortable LUNGS: clear to auscultation b/l with normal respiratory effort HEART: Tachycardic, regular  ABDOMEN:  normoactive bowel sounds , non tender, not distended. Extremity: no pedal edema PSYCH: alert & oriented x 3 with fluent speech NEURO: no focal motor/sensory deficits  LABORATORY DATA:  I have reviewed the data as listed  CBC Latest Ref Rng & Units 05/12/2020 05/10/2020 05/15/2020  WBC 4.0 - 10.5 K/uL 7.9 8.0 7.8  Hemoglobin 13.0 - 17.0 g/dL 8.7(L) 8.4(L) 8.5(L)  Hematocrit 39.0 - 52.0 % 28.3(L) 27.8(L) 28.2(L)  Platelets 150 - 400 K/uL 202 208 207    CMP Latest Ref  Rng & Units 05/14/2020 05/14/2020 05/14/2020  Glucose 70 - 99 mg/dL - - -  BUN 8 - 23 mg/dL - - -  Creatinine 0.61 - 1.24 mg/dL - - -  Sodium 135 - 145 mmol/L 141 140 141  Potassium 3.5 - 5.1 mmol/L 3.6 3.8 3.9  Chloride 98 - 111 mmol/L - - -  CO2 22 - 32 mmol/L - - -  Calcium 8.9 - 10.3 mg/dL - - -  Total Protein 6.5 - 8.1 g/dL - - -  Total Bilirubin 0.3 - 1.2 mg/dL - - -  Alkaline Phos 38 - 126 U/L - - -  AST 15 - 41 U/L - - -  ALT 0 - 44 U/L - - -   Component     Latest Ref Rng & Units 04/17/2017 11/27/2017 07/22/2019 07/29/2019  IgG (Immunoglobin G), Serum     603 - 1,613 mg/dL    2,155 (H)  IgA     61 - 437 mg/dL    320  IgM (Immunoglobulin M), Srm     20 - 172 mg/dL    10 (L)  Total Protein ELP     6.0 - 8.5 g/dL    6.0  Albumin SerPl Elph-Mcnc     2.9 - 4.4 g/dL    2.5 (L)  Alpha 1     0.0 - 0.4 g/dL    0.2  Alpha2 Glob SerPl Elph-Mcnc     0.4 - 1.0 g/dL    0.5  B-Globulin SerPl Elph-Mcnc     0.7 - 1.3 g/dL    0.5 (L)  Gamma Glob SerPl Elph-Mcnc     0.4 - 1.8 g/dL    2.2 (H)  M  Protein SerPl Elph-Mcnc     Not Observed g/dL    1.6 (H)  Globulin, Total     2.2 - 3.9 g/dL    3.5  Albumin/Glob SerPl     0.7 - 1.7    0.8  IFE 1         Comment  Please Note (HCV):         Comment  Anticardiolipin Ab,IgA,Qn     0 - 11 APL U/mL      Anticardiolipin Ab,IgG,Qn     0 - 14 GPL U/mL      Anticardiolipin Ab,IgM,Qn     0 - 12 MPL U/mL      PTT Lupus Anticoagulant     0.0 - 51.9 sec      DRVVT     0.0 - 47.0 sec      Phosphatydalserine, IgG     0 - 11 GPS IgG      Phosphatydalserine, IgM     0 - 25 MPS IgM      Phosphatydalserine, IgA     0 - 20 APS IgA      Lupus Anticoag Interp           APTT     22.9 - 30.2 sec      aPTT 1:1 Normal Plasma     22.9 - 30.2 sec      aPTT 1:1 Mix Saline           aPTT 1:1 NP Mix, 60 Min,Incub.     22.9 - 30.2 sec      aPTT 1:1 NP Incub. Mix Ctl     22.9 - 30.2 sec      Coagulation Factor VIII     56 - 140 %  182 (H)  Ristocetin Co-factor, Plasma     50 - 200 %    91  Von Willebrand Antigen, Plasma     50 - 200 %    117  PT     9.6 - 11.5 sec      PT 1:1NP     9.6 - 11.5 sec      1 HR INCUB PT 1:1NP     9.6 - 11.5 sec      PFA Interpretation           Collagen / Epinephrine     0 - 193 seconds    132  Ferritin     24 - 336 ng/mL 13.0 (L)   28  Vitamin B12     211 - 911 pg/mL  321    Factor VII Activity     51 - 186 %   69   Factor XIII, Qualitative     No Lysis/24    NORMAL  Von Willebrand Multimers         Comment  Factor X Activity     76 - 183 %      Factor V Activity     70 - 150 %      Factor II Activity     50 - 154 %      THROMBIN TIME     0.0 - 23.0 sec      Interpretation         Note  Sed Rate     0 - 16 mm/hr       Component     Latest Ref Rng & Units 07/31/2019 09/24/2019  IgG (Immunoglobin G), Serum     603 - 1,613 mg/dL    IgA     61 - 437 mg/dL    IgM (Immunoglobulin M), Srm     20 - 172 mg/dL    Total Protein ELP     6.0 - 8.5 g/dL    Albumin SerPl Elph-Mcnc     2.9 - 4.4  g/dL    Alpha 1     0.0 - 0.4 g/dL    Alpha2 Glob SerPl Elph-Mcnc     0.4 - 1.0 g/dL    B-Globulin SerPl Elph-Mcnc     0.7 - 1.3 g/dL    Gamma Glob SerPl Elph-Mcnc     0.4 - 1.8 g/dL    M Protein SerPl Elph-Mcnc     Not Observed g/dL    Globulin, Total     2.2 - 3.9 g/dL    Albumin/Glob SerPl     0.7 - 1.7    IFE 1         Please Note (HCV):         Anticardiolipin Ab,IgA,Qn     0 - 11 APL U/mL <9   Anticardiolipin Ab,IgG,Qn     0 - 14 GPL U/mL <9   Anticardiolipin Ab,IgM,Qn     0 - 12 MPL U/mL <9   PTT Lupus Anticoagulant     0.0 - 51.9 sec 64.2 (H)   DRVVT     0.0 - 47.0 sec 41.0   Phosphatydalserine, IgG     0 - 11 GPS IgG 3   Phosphatydalserine, IgM     0 - 25 MPS IgM 2   Phosphatydalserine, IgA     0 - 20 APS IgA 2   Lupus Anticoag Interp      Comment:   APTT  22.9 - 30.2 sec 35.8 (H)   aPTT 1:1 Normal Plasma     22.9 - 30.2 sec 32.4 (H)   aPTT 1:1 Mix Saline      NOT PERFORMED   aPTT 1:1 NP Mix, 60 Min,Incub.     22.9 - 30.2 sec 35.0 (H)   aPTT 1:1 NP Incub. Mix Ctl     22.9 - 30.2 sec 33.0 (H)   Coagulation Factor VIII     56 - 140 %    Ristocetin Co-factor, Plasma     50 - 200 %    Von Willebrand Antigen, Plasma     50 - 200 %    PT     9.6 - 11.5 sec 12.5 (H)   PT 1:1NP     9.6 - 11.5 sec 10.9   1 HR INCUB PT 1:1NP     9.6 - 11.5 sec 11.4   PFA Interpretation         Collagen / Epinephrine     0 - 193 seconds    Ferritin     24 - 336 ng/mL  122  Vitamin B12     211 - 911 pg/mL    Factor VII Activity     51 - 186 %    Factor XIII, Qualitative     No Lysis/24    Von Willebrand Multimers         Factor X Activity     76 - 183 % 95   Factor V Activity     70 - 150 % 64 (L)   Factor II Activity     50 - 154 % 82   THROMBIN TIME     0.0 - 23.0 sec 17.8   Interpretation         Sed Rate     0 - 16 mm/hr  16    RADIOGRAPHIC STUDIES: I have personally reviewed the radiological images as listed and agreed with the findings in  the report. CT HEAD WO CONTRAST  Result Date: 05/04/2020 CLINICAL DATA:  Fall, back pain, history of retroperitoneal bleeding. EXAM: CT HEAD WITHOUT CONTRAST CT CERVICAL SPINE WITHOUT CONTRAST TECHNIQUE: Multidetector CT imaging of the head and cervical spine was performed following the standard protocol without intravenous contrast. Multiplanar CT image reconstructions of the cervical spine were also generated. COMPARISON:  Prior head CT examinations 07/20/2019 and earlier, chest CT 10/29/2019. 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 Stable ill-defined hypoattenuation within the cerebral white matter which is nonspecific, but consistent with chronic small vessel ischemic disease. Stable, mild generalized parenchymal atrophy. There is no acute intracranial hemorrhage. No acute demarcated cortical infarct. No extra-axial fluid collection. No evidence of intracranial mass. No midline shift. Vascular: No hyperdense vessel.  Atherosclerotic calcifications. Skull: Normal. Negative for fracture or focal lesion. Sinuses/Orbits: Visualized orbits show no acute finding. Mild ethmoid sinus mucosal thickening. No significant mastoid effusion. CT CERVICAL SPINE FINDINGS Alignment: Straightening of the expected cervical lordosis. Minimal C7-T1 anterolisthesis. Skull base and vertebrae: The basion-dental and atlanto-dental intervals are maintained.No evidence of acute fracture to the cervical spine. Soft tissues and spinal canal: No prevertebral fluid or swelling. No visible canal hematoma. Disc levels: Cervical spondylosis with multilevel disc space narrowing, posterior disc osteophytes, uncovertebral and facet hypertrophy. Disc space narrowing is advanced at C3-C4  and C4-C5. Degenerative  fusion of the C5-C6 vertebral bodies. Upper chest: Reported separately. No consolidation within the imaged lung apices. No visible pneumothorax. Prior median sternotomy. Indeterminate 9 mm well-circumscribed lucent focus within the posterior T1 vertebral body. Incompletely imaged irregular lucency within the posterior left second rib. IMPRESSION: CT head: 1. No evidence of acute intracranial abnormality. 2. 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 3. Stable mild generalized parenchymal atrophy and chronic small vessel ischemic disease. 4. Mild ethmoid sinus mucosal thickening. Cervical spine: 1. No evidence of acute fracture to the cervical spine. 2. Indeterminate 9 mm well-circumscribed lucent lesion within the posterior T1 vertebral body. Additionally, there is incompletely imaged irregular lucency within the posterior left second rib. Please refer to separately reported CT chest. 3. Cervical spondylosis as described. Degenerative C5-C6 fusion. 4. Minimal C7-T1 grade 1 anterolisthesis. Electronically Signed   By: Kellie Simmering DO   On: 04/23/2020 18:43   CT Chest W Contrast  Result Date: 05/14/2020 CLINICAL DATA:  Status post fall. EXAM: CT CHEST, ABDOMEN, AND PELVIS WITH CONTRAST TECHNIQUE: Multidetector CT imaging of the chest, abdomen and pelvis was performed following the standard protocol during bolus administration of intravenous contrast. CONTRAST:  167mL OMNIPAQUE IOHEXOL 300 MG/ML  SOLN COMPARISON:  None. FINDINGS: CT CHEST FINDINGS Cardiovascular: There is moderate severity calcification of the aortic arch. There is moderate severity cardiomegaly. No pericardial effusion. Mediastinum/Nodes: No enlarged mediastinal, hilar, or axillary lymph nodes. Thyroid gland, trachea, and esophagus demonstrate no significant findings. Lungs/Pleura: A 3.7 cm x 1.5 cm low-attenuation mass is seen along the posterior aspect of the left apex. An  adjacent expansile lytic area is seen within the posterior aspect of the second left rib (axial CT image 15, CT series number 3). Moderate severity atelectasis and/or infiltrate is seen within the left lower lobe. There is no evidence of a pleural effusion or pneumothorax. Musculoskeletal: Multiple sternal wires are seen. A 4.3 cm x 2.2 cm intramuscular lipoma is seen along the anterior left chest wall. Degenerative changes are seen throughout the thoracic spine. CT ABDOMEN PELVIS FINDINGS Hepatobiliary: No focal liver abnormality is seen. No gallstones, gallbladder wall thickening, or biliary dilatation. Pancreas: Unremarkable. No pancreatic ductal dilatation or surrounding inflammatory changes. Spleen: Normal in size without focal abnormality. Adrenals/Urinary Tract: Adrenal glands are unremarkable. Innumerable cysts of various sizes are seen within both kidneys. There is no evidence of renal calculi or hydronephrosis. Bladder is unremarkable. Stomach/Bowel: Stomach is within normal limits. Appendix appears normal. No evidence of bowel wall thickening, distention, or inflammatory changes. Vascular/Lymphatic: There is moderate severity calcification and atherosclerosis throughout the abdominal aorta. No enlarged abdominal or pelvic lymph nodes. Reproductive: The prostate gland is mildly enlarged. Other: No abdominal wall hernia or abnormality. No abdominopelvic ascites. Musculoskeletal: Multilevel degenerative changes are seen throughout the lumbar spine. IMPRESSION: 1. 3.7 cm x 1.5 cm low-attenuation mass along the posterior aspect of the left apex with an adjacent expansile lytic area seen within the posterior aspect of the second left rib. 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 2. Moderate severity atelectasis and/or infiltrate within the left lower lobe. 3. Innumerable cysts of various sizes within both kidneys consistent with polycystic kidney  disease. 4. 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 5. Mild enlargement of the prostate gland. 6. Aortic atherosclerosis. Aortic Atherosclerosis (ICD10-I70.0). Electronically Signed   By: Joyce Gross.D.  On: 05/20/2020 18:34   CT CERVICAL SPINE WO CONTRAST  Result Date: 05/20/2020 CLINICAL DATA:  Fall, back pain, history of retroperitoneal bleeding. EXAM: CT HEAD WITHOUT CONTRAST CT CERVICAL SPINE WITHOUT CONTRAST TECHNIQUE: Multidetector CT imaging of the head and cervical spine was performed following the standard protocol without intravenous contrast. Multiplanar CT image reconstructions of the cervical spine were also generated. COMPARISON:  Prior head CT examinations 07/20/2019 and earlier, chest CT 10/29/2019. 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 Stable ill-defined hypoattenuation within the cerebral white matter which is nonspecific, but consistent with chronic small vessel ischemic disease. Stable, mild generalized parenchymal atrophy. There is no acute intracranial hemorrhage. No acute demarcated cortical infarct. No extra-axial fluid collection. No evidence of intracranial mass. No midline shift. Vascular: No hyperdense vessel.  Atherosclerotic calcifications. Skull: Normal. Negative for fracture or focal lesion. Sinuses/Orbits: Visualized orbits show no acute finding. Mild ethmoid sinus mucosal thickening. No significant mastoid effusion. CT CERVICAL SPINE FINDINGS Alignment: Straightening of the expected cervical lordosis. Minimal C7-T1 anterolisthesis. Skull base and vertebrae: The basion-dental and atlanto-dental intervals are maintained.No evidence of acute fracture to the cervical spine. Soft tissues and spinal canal:  No prevertebral fluid or swelling. No visible canal hematoma. Disc levels: Cervical spondylosis with multilevel disc space narrowing, posterior disc osteophytes, uncovertebral and facet hypertrophy. Disc space narrowing is advanced at C3-C4 and C4-C5. Degenerative fusion of the C5-C6 vertebral bodies. Upper chest: Reported separately. No consolidation within the imaged lung apices. No visible pneumothorax. Prior median sternotomy. Indeterminate 9 mm well-circumscribed lucent focus within the posterior T1 vertebral body. Incompletely imaged irregular lucency within the posterior left second rib. IMPRESSION: CT head: 1. No evidence of acute intracranial abnormality. 2. 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 3. Stable mild generalized parenchymal atrophy and chronic small vessel ischemic disease. 4. Mild ethmoid sinus mucosal thickening. Cervical spine: 1. No evidence of acute fracture to the cervical spine. 2. Indeterminate 9 mm well-circumscribed lucent lesion within the posterior T1 vertebral body. Additionally, there is incompletely imaged irregular lucency within the posterior left second rib. Please refer to separately reported CT chest. 3. Cervical spondylosis as described. Degenerative C5-C6 fusion. 4. Minimal C7-T1 grade 1 anterolisthesis. Electronically Signed   By: Kellie Simmering DO   On: 05/20/2020 18:43   MR THORACIC SPINE W WO CONTRAST  Addendum Date: 05/09/2020   ADDENDUM REPORT: 05/18/2020 22:19 ADDENDUM: Critical Value/emergent results were called by telephone at the time of interpretation on 05/03/2020 at 2210 hours to Dr. Shirlyn Goltz , who verbally acknowledged these results. Furthermore, after reading the Lumbar MRI on this patient, it is possible this large amount of intraspinal hematoma is in the subdural space in addition to (or rather than) the epidural space. Electronically Signed   By: Genevie Ann M.D.   On: 05/19/2020  22:19   Result Date: 05/12/2020 CLINICAL DATA:  74 year old male status post fall with T6 vertebral fracture and abnormal appearance of the thoracic spinal canal suggesting cord compression on CT. Lytic left 2nd and 5th posterior ribs. EXAM: MRI THORACIC WITHOUT AND WITH CONTRAST TECHNIQUE: Multiplanar and multiecho pulse sequences of the thoracic spine were obtained without and with intravenous contrast. CONTRAST:  8.67mL GADAVIST GADOBUTROL 1 MMOL/ML IV SOLN COMPARISON:  CT Chest, Abdomen, and Pelvis, thoracic and  lumbar spine today reported separately. FINDINGS: Limited cervical spine imaging: Suspected abnormal epidural material tracking cephalad into the cervical spine from the thoracic spine, probably to at least the C6 level. Thoracic spine segmentation:  Normal. Alignment: Stable from the earlier CTs. No significant spondylolisthesis. Vertebrae: T6 vertebral body compression with 35% loss of vertebral body height and diffusely abnormal, decreased precontrast T1 marrow signal throughout the body, but also the right T6 pedicle and lamina. Associated intense enhancement and STIR hyperintensity. Mild retropulsion of bone or abnormal soft tissue. There are also decreased T1, STIR hyperintense, and enhancing lesions in the left T8 transverse process, T8 spinous process, right posterior T12 vertebral body, as well as the left 3rd, 5th, and 6th posterior ribs. No dispense Lea the bulky soft tissue destroying and extending out of the left 3rd rib (series 19, image 6). No other thoracic pathologic fracture identified. Cord: Diffusely abnormal spinal canal where there appears to be a large volume of T1 and T2 hyperintense, partially GRE hypointense, hemorrhage (series 16, image 9) tracking from the level of the conus throughout the thoracic spine and into the lower cervical spine. There is widespread associated spinal cord mass effect which is mild at the cervicothoracic junction (series 18, image 1) but severe at the  T4-T5 through T6-T7 levels (series 18, images 15 through 18). And dorsal to the abnormal T6 vertebra the epidural blood appears most heterogeneous and also demonstrates enhancement following contrast (series 22, image 8). See also series 18, image 18 and series 23, image 18. No superimposed dural thickening or enhancement. No abnormal spinal cord signal despite the compression. No abnormal intra-axial enhancement of the cord. Paraspinal and other soft tissues: There is also bulky abnormal prevertebral material in the mid and upper thoracic spine which is heterogeneous on T2 and GRE sequences, T1 hypointense and seems not to be enhancing. Burtis Junes this is also hemorrhage. Superimposed pleural fluid or hemothorax. There is possibly a small area of soft tissue injury superficial to the T5 and T6 spinous processes. But otherwise the dorsal paraspinal soft tissues are within normal limits. Partially visible polycystic renal disease in the upper abdomen. Disc levels: No age advanced degenerative changes. IMPRESSION: 1. 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 2. Subsequent widespread spinal cord mass effect, with up to severe cord compression T4-T5 through T6-T7. No cord signal abnormality identified, although there could be active bleeding (enhancement) within the dorsal epidural hematoma at T5 and T6. 3. Similar bulky prevertebral hematoma also suspected in the midthoracic spine. And small pleural effusions or pleural blood. 4. See also Lumbar MRI reported separately. Electronically Signed: By: Genevie Ann M.D. On: 04/28/2020 22:05   MR Lumbar Spine W Wo Contrast  Result Date: 05/15/2020 CLINICAL DATA:  75 year old male status post fall with T6 vertebral fracture and abnormal appearance of the thoracic spinal canal suggesting cord  compression on CT. Lytic left 2nd and 5th posterior ribs. EXAM: MRI LUMBAR SPINE WITHOUT AND WITH CONTRAST TECHNIQUE: Multiplanar and multiecho pulse sequences of the lumbar spine were obtained without and with intravenous contrast. CONTRAST:  8.50mL GADAVIST GADOBUTROL 1 MMOL/ML IV SOLN COMPARISON:  Thoracic spine MRI today reported separately along with CT Chest, Abdomen, and Pelvis today. FINDINGS: Segmentation: Normal, concordant with the thoracic spine numbering today. Alignment: Mild straightening of lumbar lordosis. Subtle retrolisthesis of L1 on L2. subtle anterolisthesis of  L4 on L5. Vertebrae: Enhancing bone lesions in the right posterior T12 and inferior L4 vertebral bodies. Smaller enhancing lesions in the left L5 pedicle, left S3 vertebra. Compression and heterogeneous enhancement and edema throughout the L3 vertebral body. Central loss of height up to 25%. No retropulsed bone. However, there is an associated right transverse process fracture of L3 with marrow edema, no enhancement. But preserved normal marrow signal in the other L3 posterior elements. Visible SI joints appear intact. There is an expansile bone lesion of the right iliac wing on series 29, image 34. Conus medullaris and cauda equina: Only faint abnormal epidural or subdural space signal along the visible lower thoracic spinal cord and conus (series 23, image 9 and series 26, image 5. This does not continue below the T12-L1 level. Conus extends to the T12-L1 level. No lower spinal cord or conus signal abnormality. Cauda equina nerve roots appear fairly normal. No abnormal intradural enhancement in the lumbar spine. Paraspinal and other soft tissues: Polycystic renal disease. Lumbar paraspinal soft tissues are within normal limits. Disc levels: Mostly mild for age lumbar spine degeneration. There is moderate to severe facet hypertrophy in the lower lumbar spine. No degenerative spinal stenosis. IMPRESSION: 1. 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 2. Probable pathologic fracture of the L3 vertebral body. 25% loss of height with no retropulsion. Associated fracture of the right L3 transverse process. 3. The abnormal epidural (versus subdural) Intraspinal Hemorrhage seen in the thoracic spine tapers at the level of the conus, T12-L1. No conus signal abnormality and normal cauda equina nerve roots. 4. Ordinary lower lumbar spine degeneration, but no degenerative spinal stenosis. Electronically Signed   By: Genevie Ann M.D.   On: 05/07/2020 22:16   MR ABDOMEN WWO CONTRAST  Result Date: 04/27/2020 CLINICAL DATA:  Pancreatic lesion on prior CT EXAM: MRI ABDOMEN WITHOUT AND WITH CONTRAST TECHNIQUE: Multiplanar multisequence MR imaging of the abdomen was performed both before and after the administration of intravenous contrast. CONTRAST:  108mL MULTIHANCE GADOBENATE DIMEGLUMINE 529 MG/ML IV SOLN COMPARISON:  CT abdomen/pelvis dated 02/18/2020. CT abdomen/pelvis dated 01/12/2020. CTA chest dated 10/29/2019. CTA chest abdomen pelvis dated 04/13/2019. FINDINGS: Lower chest: Lung bases are clear.  Cardiomegaly. Hepatobiliary: Liver is within normal limits. No suspicious/enhancing hepatic lesions. Layering tiny gallstones (series 3/image 25). No associated inflammatory changes. No intrahepatic or extrahepatic ductal dilatation. Pancreas: Mild ductal dilatation in the distal pancreatic tail (series 3/image 18) with associated mild segmental atrophy of the proximal pancreatic tail (series 3/image 22). An underlying 14 x 18 mm hypoenhancing, non contour deforming lesion in the proximal pancreatic tail is possible (series 14/image 68), although poorly evaluated/visualized due to the limitations of this study. Spleen:  Within normal limits. Adrenals/Urinary Tract:  Adrenal glands are within normal limits. Numerous bilateral renal cysts of varying sizes and complexities, including a dominant 7.4 cm  right lower pole simple cyst (series 3/image 84) and a dominant 6.7 cm left upper pole septated cyst (series 3/image 20), benign (Bosniak I-II). No enhancing renal lesions. No hydronephrosis. Stomach/Bowel: Stomach is within normal limits. Visualized bowel is unremarkable. Vascular/Lymphatic:  No evidence of abdominal aortic aneurysm. No suspicious abdominal lymphadenopathy. Other:  No abdominal ascites. Musculoskeletal: No focal osseous lesions. IMPRESSION: Possible 14 x 18 mm hypoenhancing lesion in the proximal pancreatic tail, poorly evaluated/visualized on the current study. Associated mild ductal dilatation in the distal pancreatic tail. EUS is suggested for further evaluation. These results will be called  to the ordering clinician or representative by the Radiologist Assistant, and communication documented in the PACS or [employer]. Electronically Signed   By: Julian Hy M.D.   On: 04/27/2020 08:13   CT ABDOMEN PELVIS W CONTRAST  Result Date: 05/07/2020 CLINICAL DATA:  Status post fall with subsequent back pain. EXAM: CT CHEST, ABDOMEN, AND PELVIS WITH CONTRAST TECHNIQUE: Multidetector CT imaging of the chest, abdomen and pelvis was performed following the standard protocol during bolus administration of intravenous contrast. CONTRAST:  162mL OMNIPAQUE IOHEXOL 300 MG/ML  SOLN COMPARISON:  None. FINDINGS: CT CHEST FINDINGS Cardiovascular: There is moderate severity calcification of the aortic arch. There is moderate severity cardiomegaly. No pericardial effusion. Mediastinum/Nodes: No enlarged mediastinal, hilar, or axillary lymph nodes. Thyroid gland, trachea, and esophagus demonstrate no significant findings. Lungs/Pleura: A 3.7 cm x 1.5 cm low-attenuation mass is seen along the posterior aspect of the left apex. An adjacent expansile lytic area is seen within the posterior aspect of the second left rib (axial CT image 15, CT series number 3). Moderate severity atelectasis and/or infiltrate  is seen within the left lower lobe. There is no evidence of a pleural effusion or pneumothorax. Musculoskeletal: Multiple sternal wires are seen. A 4.3 cm x 2.2 cm intramuscular lipoma is seen along the anterior left chest wall. Degenerative changes are seen throughout the thoracic spine. CT ABDOMEN PELVIS FINDINGS Hepatobiliary: No focal liver abnormality is seen. No gallstones, gallbladder wall thickening, or biliary dilatation. Pancreas: Unremarkable. No pancreatic ductal dilatation or surrounding inflammatory changes. Spleen: Normal in size without focal abnormality. Adrenals/Urinary Tract: Adrenal glands are unremarkable. Innumerable cysts of various sizes are seen within both kidneys. There is no evidence of renal calculi or hydronephrosis. Bladder is unremarkable. Stomach/Bowel: Stomach is within normal limits. Appendix appears normal. No evidence of bowel wall thickening, distention, or inflammatory changes. Vascular/Lymphatic: There is moderate severity calcification and atherosclerosis throughout the abdominal aorta. No enlarged abdominal or pelvic lymph nodes. Reproductive: The prostate gland is mildly enlarged. Other: No abdominal wall hernia or abnormality. No abdominopelvic ascites. Musculoskeletal: Multilevel degenerative changes are seen throughout the lumbar spine. IMPRESSION: 1. 3.7 cm x 1.5 cm low-attenuation mass along the posterior aspect of the left apex with an adjacent expansile lytic area seen within the posterior aspect of the second left rib. 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 2. Moderate severity atelectasis and/or infiltrate within the left lower lobe. 3. Innumerable cysts of various sizes within both kidneys consistent with polycystic kidney disease. 4. 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 5. Mild enlargement of the prostate gland. 6. Aortic atherosclerosis. Aortic Atherosclerosis  (ICD10-I70.0). Electronically Signed   By: Virgina Norfolk M.D.   On: 05/17/2020 18:33   DG Thoracic Spine 1 View  Result Date: 05/14/2020 CLINICAL DATA:  T6 laminectomy EXAM: OPERATIVE THORACIC SPINE 1 VIEW(S) FLUOROSCOPY TIME:  28 seconds COMPARISON:  MRI thoracic spine dated 05/23/2020 FINDINGS: Single intraoperative fluoroscopic image during T6 laminectomy, used for intraoperative localization. IMPRESSION: Single intraoperative fluoroscopic image during T6 laminectomy. Electronically Signed   By: Julian Hy M.D.   On: 05/14/2020 05:05   CT T-SPINE NO CHARGE  Result Date: 04/30/2020 CLINICAL DATA:  Status post fall. EXAM: CT THORACIC SPINE WITHOUT CONTRAST TECHNIQUE: Multidetector CT images of the thoracic were obtained using the standard protocol without intravenous contrast. COMPARISON:  None. FINDINGS: Alignment: Normal. Vertebrae: A compression fracture deformity is seen involving the T6 vertebral body.  This is of indeterminate age. An adjacent 2.3 cm x 2.2 cm lytic area is seen within this region. Mild paraspinal soft tissue swelling is noted. Paraspinal and other soft tissues: A 3.9 cm x 1.5 cm heterogeneous low-attenuation soft tissue mass is seen along the posterior aspect of the left apex. The adjacent portion of the posterior second left rib is expansile and lytic in appearance. Disc levels: Mild multilevel endplate sclerosis is seen with mild intervertebral disc space narrowing is seen. IMPRESSION: 1. T6 vertebral body compression fracture deformity with an associated 2.3 cm x 2.2 cm lytic area and paraspinal soft tissue swelling. Further evaluation with MRI is recommended. 2. 3.9 cm x 1.5 cm heterogeneous low-attenuation soft tissue mass along the posterior aspect of the left apex with an expansile and lytic appearing posterior second left rib. This may represent a primary bronchogenic carcinoma. Further evaluation with a nuclear medicine PET/CT scan is recommended. Electronically  Signed   By: Virgina Norfolk M.D.   On: 05/01/2020 18:39   CT L-SPINE NO CHARGE  Result Date: 05/09/2020 CLINICAL DATA:  Status post fall. EXAM: CT LUMBAR SPINE WITHOUT CONTRAST TECHNIQUE: Multidetector CT imaging of the lumbar spine was performed without intravenous contrast administration. Multiplanar CT image reconstructions were also generated. COMPARISON:  None. FINDINGS: Segmentation: 5 lumbar type vertebrae. Alignment: Normal. Vertebrae: No acute fracture. A 1.6 cm x 0.9 cm well-defined lytic area is seen within the posterior aspect of the lower L4 vertebral body (sagittal reformatted image 150, CT series number 1). Paraspinal and other soft tissues: Negative. Disc levels: Mild to moderate severity multilevel endplate sclerosis is seen with mild multilevel intervertebral disc space narrowing IMPRESSION: 1. No acute fracture within the lumbar spine. 2. Mild to moderate severity multilevel degenerative changes. 3. 1.6 cm x 0.9 cm well-defined lytic area within the posterior aspect of the lower L4 vertebral body. This may represent a small hemangioma. MRI correlation is recommended. Electronically Signed   By: Virgina Norfolk M.D.   On: 05/15/2020 18:41   DG CHEST PORT 1 VIEW  Result Date: 05/14/2020 CLINICAL DATA:  Intubated EXAM: PORTABLE CHEST 1 VIEW COMPARISON:  05/05/2020 at 1517 hours FINDINGS: Endotracheal tube terminates 5.5 cm above the carina. Enteric tube terminates at the level of the diaphragm/GE junction. Left IJ venous catheter terminates in the distal left brachiocephalic vein/SVC. Mild left basilar atelectasis. Lungs are otherwise clear. No pleural effusion or pneumothorax. Cardiomegaly.  Postsurgical changes related to prior CABG. Median sternotomy.  Skin staples overlying the midline chest. IMPRESSION: Endotracheal tube terminates 5.5 cm above the carina. Enteric tube terminates at the level of the diaphragm/GE junction. Left IJ venous catheter terminates in the distal left  brachiocephalic vein/SVC. No evidence of acute cardiopulmonary disease. Electronically Signed   By: Julian Hy M.D.   On: 05/14/2020 05:04   DG Chest Port 1 View  Result Date: 05/23/2020 CLINICAL DATA:  [place] while walking to the bathroom with his walker, lost balance and fell into seated position, BILATERAL flank pain EXAM: PORTABLE CHEST 1 VIEW COMPARISON:  Portable exam 1517 hours compared to 01/12/2020 FINDINGS: Enlargement of cardiac silhouette post median sternotomy. Atherosclerotic calcification aorta. Mediastinal contours and pulmonary vascularity normal. LEFT basilar atelectasis without pulmonary infiltrate, pleural effusion, or pneumothorax. Bones demineralized. IMPRESSION: Enlargement of cardiac silhouette with LEFT basilar atelectasis. Electronically Signed   By: Lavonia Dana M.D.   On: 05/19/2020 15:24   DG Knee Complete 4 Views Left  Result Date: 05/14/2020 CLINICAL DATA:  Status post trauma. EXAM: LEFT  KNEE - COMPLETE 4+ VIEW COMPARISON:  None. FINDINGS: No evidence of fracture, dislocation, or joint effusion. Moderate severity narrowing of the medial and lateral tibiofemoral compartment spaces is seen. There is marked severity vascular calcification. IMPRESSION: Moderate severity degenerative changes without evidence of acute osseous abnormality. Electronically Signed   By: Virgina Norfolk M.D.   On: 04/27/2020 16:11   DG C-Arm 1-60 Min  Result Date: 05/14/2020 CLINICAL DATA:  T6 laminectomy EXAM: OPERATIVE THORACIC SPINE 1 VIEW(S) FLUOROSCOPY TIME:  28 seconds COMPARISON:  MRI thoracic spine dated 05/23/2020 FINDINGS: Single intraoperative fluoroscopic image during T6 laminectomy, used for intraoperative localization. IMPRESSION: Single intraoperative fluoroscopic image during T6 laminectomy. Electronically Signed   By: Julian Hy M.D.   On: 05/14/2020 05:05     ASSESSMENT & PLAN:  [facility]. is a 75 y.o. 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 No issues with spontaneous bleeding at baseline.  Now admitted following a fall and status post thoracic laminectomy for hematoma evacuation and did return to the OR today for the fusion.  He did well with his prior surgery without any significant bleeding.  He received 3 units of FFP and 2 units of platelets.  1) unspecified bleeding disorder -No significant bleeding postoperatively -Hemoglobin currently stable. -As previously outlined, recommend 3 units FFP preprocedure and consider repeating depending on volume status -Also recommend a unit of platelets before the procedure and another after regardless of platelet count -Also recommend tranexamic acid 1 g IV preprocedure and then 1 g by continuous infusion over 6 hours -If hemostasis not achieved with the above measures, requests her pharmacy to dose NovoSeven at 20 mcg/kg and repeat every 4 hours as needed.   2) Iron deficiency -- received IV Iron in the past . Lab Results  Component Value Date   IRON 196 (H) 12/29/2019   TIBC 209 12/29/2019   IRONPCTSAT 94 (H) 12/29/2019   (Iron and TIBC)  Lab Results  Component Value Date   FERRITIN 69 12/29/2019  -no indication for additional IV Iron at this time.   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 All elements of the patient's history of present illness , assessment and plan were discussed in details with Mikey Bussing, DNP,  AGPCNP-BC, AOCNP. The above documentation reflects our combined findings assessment and plan.  -rpt labs, cbc/diff, iron studies and myeloma panel in AM -monitor for fluid overload with FFP/platelet infusions -will f/u on pathology from epidural tumor ? Plasmacytoma vs bone mets -- further mx as per pathology results.  Sullivan Lone MD MS

## 2020-05-16 NOTE — Progress Notes (Signed)
Pt has been in surgical short stay awaiting procedure. Pt has been continuously monitored running Afib w/  RVR, HR 120's-150's. MD aware.

## 2020-05-16 NOTE — Anesthesia Preprocedure Evaluation (Addendum)
Anesthesia Evaluation  Patient identified by MRN, date of birth, ID band Patient awake    Reviewed: Allergy & Precautions, NPO status , Patient's Chart, lab work & pertinent test results  Airway Mallampati: I  TM Distance: >3 FB Neck ROM: Full    Dental   Pulmonary former smoker,    Pulmonary exam normal        Cardiovascular hypertension, + CAD  Normal cardiovascular exam+ dysrhythmias Atrial Fibrillation + Cardiac Defibrillator      Neuro/Psych CVA    GI/Hepatic   Endo/Other    Renal/GU Renal InsufficiencyRenal disease     Musculoskeletal   Abdominal   Peds  Hematology   Anesthesia Other Findings   Reproductive/Obstetrics                             Anesthesia Physical Anesthesia Plan  ASA: IV  Anesthesia Plan: General   Post-op Pain Management:    Induction: Intravenous  PONV Risk Score and Plan: 2  Airway Management Planned: Oral ETT  Additional Equipment:   Intra-op Plan:   Post-operative Plan: Possible Post-op intubation/ventilation  Informed Consent: I have reviewed the patients History and Physical, chart, labs and discussed the procedure including the risks, benefits and alternatives for the proposed anesthesia with the patient or authorized representative who has indicated his/her understanding and acceptance.       Plan Discussed with: CRNA and Surgeon  Anesthesia Plan Comments:         Anesthesia Quick Evaluation

## 2020-05-16 NOTE — Anesthesia Procedure Notes (Signed)
Procedure Name: Intubation Date/Time: 05/08/2020 4:40 PM Performed by: Renato Shin, CRNA Pre-anesthesia Checklist: Patient identified, Emergency Drugs available, Suction available and Patient being monitored Patient Re-evaluated:Patient Re-evaluated prior to induction Oxygen Delivery Method: Circle system utilized Preoxygenation: Pre-oxygenation with 100% oxygen Induction Type: IV induction Ventilation: Mask ventilation without difficulty Laryngoscope Size: Miller and 3 Grade View: Grade I Tube type: Oral Tube size: 8.0 mm Number of attempts: 1 Airway Equipment and Method: Stylet and Oral airway Placement Confirmation: ETT inserted through vocal cords under direct vision,  positive ETCO2 and breath sounds checked- equal and bilateral Secured at: 22 cm Tube secured with: Tape Dental Injury: Teeth and Oropharynx as per pre-operative assessment

## 2020-05-16 NOTE — Anesthesia Procedure Notes (Signed)
Arterial Line Insertion Start/End05/20/2021 3:05 PM, 05/16/2020 3:15 PM Performed by: Milford Cage, CRNA, CRNA  Patient location: Pre-op. Preanesthetic checklist: patient identified, IV checked, site marked, risks and benefits discussed, surgical consent, monitors and equipment checked, pre-op evaluation, timeout performed and anesthesia consent Lidocaine 1% used for infiltration Left, radial was placed Catheter size: 20 G Hand hygiene performed , maximum sterile barriers used  and Seldinger technique used  Attempts: 1 Procedure performed using ultrasound guided technique. Ultrasound Notes:anatomy identified, needle tip was noted to be adjacent to the nerve/plexus identified and no ultrasound evidence of intravascular and/or intraneural injection Following insertion, dressing applied. Post procedure assessment: normal and unchanged  Patient tolerated the procedure well with no immediate complications.

## 2020-05-16 NOTE — Op Note (Signed)
04/24/2020  8:40 PM  PATIENT:  Louis Ford.  75 y.o. male  PRE-OPERATIVE DIAGNOSIS:  T6 MYELOMA  POST-OPERATIVE DIAGNOSIS:  T6 MYELOMA  PROCEDURE:  Procedure(s): Part ll THORACIC FOUR-THORACIC EIGHT POSTERIOR LATERAL ARTHRODESIS Autograft morsels Segmental pedicle screw fixation T4-T8 SURGEON: Surgeon(s): [name], MD  ASSISTANTS:none  ANESTHESIA:   general  EBL:  Total I/O In: 395 [Blood:395] Out: 60 [Urine:60]  BLOOD ADMINISTERED:2units CC PRBC, 3units FFP and 1 jumbo PLTS  CELL SAVER GIVEN:none  COUNT:per nursing  DRAINS: (1) Hemovact drain(s) in the subfascial space with  Suction Open   SPECIMEN:  No Specimen  DICTATION: [facility]. was taken to the operating room, intubated, and placed under a general anesthetic without difficulty. He was positioned prone on the [place] table with all pressure points properly padded. His previous incision and an extended area was prepped and draped in a sterile manner. I opened and extended the old incision with a 10 blade. I exposed the lamina of T4-T8 bilaterally.  With fluoroscopic guidance I placed pedicle screws at T4,5,7,and 8 in the following manner. I drilled a starting point , then cannulated the pedicle with a bone probe. I tapped the pedicle, then placed the screws. All these maneuvers were performed with fluoroscopy. I connected rods, and placed locking caps.  I drilled the lamina to decorticate the bone, and the lateral margins at T6. I placed allograft morsels from T5 to T7 on the left side. The right side was infiltrated with tumor.  I irrigated, placed the drain, then closed. I approximated the thoracolumbar fascia, subcutaneous, and subcuticular planes with vicryl sutures. I connected the drain to the hemovac. I placed a sterile dressing, dermabond, and an occlusive bandage. Final films showed all the hardware to be in good position. I secured the construct by locking the screw head caps.   PLAN OF CARE:  Admit to inpatient   PATIENT DISPOSITION:  PACU - hemodynamically stable.   Delay start of Pharmacological VTE agent (>24hrs) due to surgical blood loss or risk of bleeding:  yes

## 2020-05-16 NOTE — Progress Notes (Signed)
Patient ID: Louis Ford., male   DOB: 1946-06-01, 74 y.o.   MRN: [id number] Louis Ford presented with spinal cord compression due to a malignancy and spontaneous edh in the thoracic spine. He is going to the operating room for part ll of a planned procedure to stabilize his spine with posterior instrumentation. We are awaiting pathology sent last week.  He is alert, confused, will follow commands He is moving his right lower extremity better 3/5. Wound is clean,dry, no signs of infection OR today

## 2020-05-16 NOTE — Progress Notes (Signed)
PT Cancellation Note  Patient Details Name: Louis Ford. MRN: [id number] DOB: Apr 17, 1946   Cancelled Treatment:    Reason Eval/Treat Not Completed: Medical issues which prohibited therapy. Per Dr. Lacy Duverney note, pt planned for surgery today. Acute PT to return as able, as appropriate to complete PT evalulation s/p thoracic stabilization surgery.  Kittie Plater, PT, DPT Acute Rehabilitation Services Pager #: [phone] Office #: [phone]    Berline Lopes 05/14/2020, 6:31 AM

## 2020-05-16 NOTE — Progress Notes (Signed)
NAME:  Louis Taras., MRN:  [id number], DOB:  November 07, 1946, LOS: 3 ADMISSION DATE:  05/02/2020, CONSULTATIO care unit N DATE:  05/06/2020 REFERRING MD:  Neurosurgery, CHIEF COMPLAINT:   Fall  Brief History   74 year old male who presented to the emergency department 5/21 after a fall while walking to the bathroom was found to have pathologic compression fracture of T6 and large volume spinal epidural hemorrhage requiring hematoma evacuation and thoracic laminectomy.  He was intubated for procedure and transferred to the intensive care unit postop.  History of present illness   74 year old male with a history of hypertension, hyperlipidemia, CAD s/p CABG 2011, ischemic cardiomyopathy HFrEF, AICD, atrial fibrillation, CVA, seizure disorder, factor VII deficiency, not clearly diagnosed bleeding diathesis despite extensive work-up who was walking to the bathroom with his walker when he lost his balance and fell to seated position after his legs just gave out on him.  He was having significant pain on presentation to the ED and was found to have pathologic compression fracture of T6  large volume of Spinal Epidural Hemorrhage spanning from the cervical spine throughout the thoracic spine to the conus.  Widespread spinal cord mass-effect.  Neurosurgery and hematology evaluated, recommendation for 3 units FFP, 1 unit of platelets and 1 g of TXA  preprocedure, 1 unit platelets, 1 g TXA post procedure with additional FFP depending on volume status.  Neurosurgery performed T6 laminectomy and epidural clot evacuation.  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   Micro Data:  5/21 MRSA screen>> negative 5/21 SARS-Cov-2>> negative 5/21 urine culture>>   Antimicrobials:  Ceftriaxone 5/21-  Interim history/subjective:  Agitated and confused. In considerable pain   Objective   Blood pressure 109/75, pulse (!) 25, temperature 97.6 F (36.4 C), temperature source Axillary, resp. rate (!) 24, height 5' 11"  (1.803 m), weight 87 kg, SpO2 97 %.        Intake/Output Summary (Last 24 hours) at 05/03/2020 0939 Last data filed at 04/24/2020 0900 Gross per 24 hour  Intake 3819.86 ml  Output 1320 ml  Net 2499.86 ml   Filed Weights   05/14/20 1200  Weight: 87  kg    General: Elderly male, agitated. In pain. HEENT: MM pink/moist,  Neuro: Pupils 2 mm equal and reactive to light, breathing over the vent, 4/5 strength upper extremities.  Improving strength in lower extremities.  Still weaker on the right side. CV: s1s2 RRR, no m/r/g PULM: CTA B.   GI: soft, bsx4 active  Extremities: warm/dry, no edema  Skin: no rashes or lesions   Resolved Hospital Problem list     Assessment & Plan:   Was critically ill due to respiratory failure requiring mechanical ventilation. -Pulmonary toilet.  Fall with pathologic T6 fracture and epidural hematoma causing cord compression Status post T6 laminectomy  -for OR today    Bleeding diathesis, without definitive diagnosis, epidural tumor noted intraoperatively.  Received factor transfusions intraoperatively. -Extensive work-up by Dr. Irene Limbo and at [facility] by [name] -Work-up for possible multiple myeloma per hematology -pathology pending -Consider TXA infusion if requires further OR -Empirically transfuse FFP. -Consider tag to better define clotting disorder.  Agitation with delirium - pain related.  - increased morphine dose.  - consider change to other narcotic if confusion persists  History of seizure disorder Sound like both tonic-clonic, followed by neurology  HFrEF, CAD s/p CABG, AICD, atrial fibrillation, hypertension, hyperlipidemia Last echocardiogram in 03/2020 with EF of 30 to 35%, not chronically anti-coagulated given bleeding disorder -Resume Coreg, amiodarone once extubated. -Continue Lasix, monitor volume status and I&O's   Best practice:  Diet: Swallow screen and advance diet Pain/Anxiety/Delirium protocol (if indicated): As needed oxycodone only VAP protocol (if indicated): HOB 30 degrees, daily SBT DVT prophylaxis: SCDs.  No heparin given bleeding diathesis GI prophylaxis: Protonix Glucose control: SSI Mobility: Bedrest Code Status: Full code Family Communication: Per  primary Disposition: ICU  Labs   CBC: Recent Labs  Lab 05/20/2020 1531 05/22/2020 1551 05/15/20 0152 05/15/20 1030 05/15/20 1330 05/15/20 1717 05/07/2020 0201  WBC 3.5*   < > 8.4 8.6 8.0 7.8 8.0  NEUTROABS 2.6  --   --   --   --   --   --   HGB 12.6*   < > 9.4* 9.6* 8.6* 8.5* 8.4*  HCT 41.5   < > 30.9* 31.2* 28.3* 28.2* 27.8*  MCV 91.4   < > 91.2 92.6 92.2 92.2 93.0  PLT 191   < > 175 170 213 207 208   < > = values in this interval not displayed.    Basic Metabolic Panel: Recent Labs  Lab 05/02/2020 1531 05/09/2020 1551 05/14/20 0114 05/14/20 0159 05/14/20 0336 05/14/20 0654  NA 135 139 141 140 141  --   K 4.0 3.6 3.9 3.8 3.6  --   CL 104 104  --   --   --   --   CO2 23  --   --   --   --   --   GLUCOSE 129* 122*  --   --   --   --   BUN 36* 41*  --   --   --   --   CREATININE 1.51* 1.40*  --   --   --   --   CALCIUM 8.4*  --   --   --   --   --   MG  --   --   --   --   --  1.5*  PHOS  --   --   --   --   --  4.1   GFR: Estimated Creatinine Clearance: 51.5 mL/min (A) (  by C-G formula based on SCr of 1.4 mg/dL (H)). Recent Labs  Lab 05/15/20 1030 05/15/20 1330 05/15/20 1717 05/14/2020 0201  WBC 8.6 8.0 7.8 8.0    Liver Function Tests: Recent Labs  Lab 05/12/2020 1531  AST 36  ALT 28  ALKPHOS 99  BILITOT 1.0  PROT 10.8*  ALBUMIN 2.9*   No results for input(s): LIPASE, AMYLASE in the last 168 hours. No results for input(s): AMMONIA in the last 168 hours.  ABG    Component Value Date/Time   PHART 7.584 (H) 05/14/2020 0336   PCO2ART 21.8 (L) 05/14/2020 0336   PO2ART 154 (H) 05/14/2020 0336   HCO3 21.2 05/14/2020 0336   TCO2 22 05/14/2020 0336   ACIDBASEDEF 1.0 05/14/2020 0159   O2SAT 100.0 05/14/2020 0336     Coagulation Profile: Recent Labs  Lab 04/25/2020 1531 05/14/20 0654  INR 1.4* 1.3*    Cardiac Enzymes: No results for input(s): CKTOTAL, CKMB, CKMBINDEX, TROPONINI in the last 168 hours.  HbA1C: Hgb A1c MFr Bld  Date/Time Value Ref Range  Status  05/14/2020 06:54 AM 6.1 (H) 4.8 - 5.6 % Final    Comment:    (NOTE) Pre diabetes:          5.7%-6.4% Diabetes:              >6.4% Glycemic control for   <7.0% adults with diabetes   07/25/2019 08:22 PM 5.2 4.8 - 5.6 % Final    Comment:    (NOTE) Pre diabetes:          5.7%-6.4% Diabetes:              >6.4% Glycemic control for   <7.0% adults with diabetes     CBG: Recent Labs  Lab 05/15/20 1704 05/15/20 1951 05/12/2020 0009 05/15/2020 0317 05/15/2020 [place]     Kipp Brood, MD

## 2020-05-16 NOTE — Transfer of Care (Signed)
Immediate Anesthesia Transfer of Care Note  Patient: [facility]  Procedure(s) Performed: THORACIC FOUR-THORACIC EIGHT POSTERIOR LATERAL ARTHRODESIS (N/A Spine Thoracic)  Patient Location: ICU  Anesthesia Type:General  Level of Consciousness: sedated and Patient remains intubated per anesthesia plan  Airway & Oxygen Therapy: Patient remains intubated per anesthesia plan and Patient placed on Ventilator (see vital sign flow sheet for setting)  Post-op Assessment: Report given to RN and Post -op Vital signs reviewed and stable  Post vital signs: Reviewed and stable  Last Vitals:  Vitals Value Taken Time  BP    Temp    Pulse    Resp 18 05/23/2020 2056  SpO2    Vitals shown include unvalidated device data.  Last Pain:  Vitals:   04/25/2020 1200  TempSrc: Axillary  PainSc:          Complications: No apparent anesthesia complications

## 2020-05-16 NOTE — Progress Notes (Signed)
Initial Nutrition Assessment  DOCUMENTATION CODES:   Not applicable  INTERVENTION:   Supplement diet as appropriate to help pt meet nutritional needs.    NUTRITION DIAGNOSIS:   Increased nutrient needs related to post-op healing as evidenced by estimated needs.  GOAL:   Patient will meet greater than or equal to 90% of their needs  MONITOR:   Diet advancement, PO intake  REASON FOR ASSESSMENT:   Malnutrition Screening Tool    ASSESSMENT:   Pt with PMH of HTN, HLD, CAD s/p CABG, ischemic cardiomyopathy, CHF, AICD, Afib, CVA, bleeding diathesis factor VII deficiency, and sz disorder who was admitted 5/21 after falling while using walker to walk to his bathroom with compression fx of t6, large volume spinal epidural hemorrhage.   5/21 s/p thoracic laminectomy for hematoma evacuation, spinal tumor removed; pathology pending 5/22 extubated   Pt discussed during ICU rounds and with RN.  Pt in a lot of pain with movement. NFPE completed with RN at bedside. Pt sleepy and unable to answer any questions. Plan for OR today at 3pm.    Medications reviewed and include: lasix Neosynephrine @ 10 mcg  Labs reviewed  NUTRITION - FOCUSED PHYSICAL EXAM:    Most Recent Value  Orbital Region  No depletion  Upper Arm Region  Unable to assess  Thoracic and Lumbar Region  Unable to assess  Buccal Region  No depletion  Temple Region  No depletion  Clavicle Bone Region  Mild depletion  Clavicle and Acromion Bone Region  No depletion  Scapular Bone Region  Unable to assess  Dorsal Hand  No depletion  Patellar Region  No depletion  Anterior Thigh Region  Mild depletion  Posterior Calf Region  No depletion  Edema (RD Assessment)  Mild  Hair  Reviewed  Eyes  Unable to assess  Mouth  Unable to assess  Skin  Reviewed  Nails  Reviewed [pale]       Diet Order:   Diet Order            Diet NPO time specified  Diet effective midnight              EDUCATION NEEDS:   No  education needs have been identified at this time  Skin:  Skin Assessment: Reviewed RN Assessment(back incision)  Last BM:  unknown  Height:   Ht Readings from Last 1 Encounters:  05/14/20 5\' 11"  (1.803 m)    Weight:   Wt Readings from Last 1 Encounters:  05/14/20 87 kg    Ideal Body Weight:  78.1 kg  BMI:  Body mass index is 26.75 kg/m.  Estimated Nutritional Needs:   Kcal:  2100-2300  Protein:  105-130 grams  Fluid:  > 2 L/day  Lockie Pares., RD, LDN, CNSC See AMiON for contact information

## 2020-05-16 NOTE — Progress Notes (Signed)
OT Cancellation Note  Patient Details Name: Louis Ford. MRN: [id number] DOB: [birth year]   Cancelled Treatment:    Reason Eval/Treat Not Completed: OT screened, no needs identified, will sign off Per Dr. Lacy Duverney note, pt planned for surgery today.  Louis Ford, OTR/L  Acute Rehabilitation Services Pager: [phone] Office: [phone] .  05/08/2020, 12:42 PM

## 2020-05-16 NOTE — Anesthesia Postprocedure Evaluation (Signed)
Anesthesia Post Note  Patient: [facility].  Procedure(s) Performed: THORACIC FOUR-THORACIC EIGHT POSTERIOR LATERAL ARTHRODESIS (N/A Spine Thoracic)     Patient location during evaluation: SICU Anesthesia Type: General Level of consciousness: sedated Pain management: pain level controlled Vital Signs Assessment: post-procedure vital signs reviewed and stable Respiratory status: patient remains intubated per anesthesia plan Cardiovascular status: stable Postop Assessment: no apparent nausea or vomiting Anesthetic complications: no    Last Vitals:  Vitals:   05/17/2020 1415 04/25/2020 2100  BP: 113/84 101/75  Pulse: 71 (!) 109  Resp:  18  Temp:    SpO2: 100% 100%    Last Pain:  Vitals:   05/08/2020 1200  TempSrc: Axillary  PainSc:                  Effie Berkshire

## 2020-05-16 NOTE — Progress Notes (Signed)
eLink Physician-Brief Progress Note Patient Name: [facility]. DOB: June 07, 1946 MRN: [id number]   Date of Service  05/19/2020  HPI/Events of Note  pt just returned from OR , need order for OG and KUB, came back on Levo at 8 -intead of Neo, B/P 106/65(76) AFib 106, RN is titrating down, will need order for Levo if this is the pressor you want  Camera: discused with bed side RN. MAP 70. In synchrony with vent. HR ok. Weaning down on Levophed.  eICU Interventions  -levophed ordered.if HR goes up, call back to change to neosynephrine. Pain control.      Intervention Category Intermediate Interventions: Other:  Elmer Sow 05/15/2020, 9:18 PM

## 2020-05-17 LAB — BASIC METABOLIC PANEL
Anion gap: 8 (ref 5–15)
BUN: 24 mg/dL — ABNORMAL HIGH (ref 8–23)
CO2: 21 mmol/L — ABNORMAL LOW (ref 22–32)
Calcium: 8.1 mg/dL — ABNORMAL LOW (ref 8.9–10.3)
Chloride: 111 mmol/L (ref 98–111)
Creatinine, Ser: 1.52 mg/dL — ABNORMAL HIGH (ref 0.61–1.24)
GFR calc Af Amer: 53 mL/min — ABNORMAL LOW (ref >60)
GFR calc non Af Amer: 45 mL/min — ABNORMAL LOW (ref >60)
Glucose, Bld: 166 mg/dL — ABNORMAL HIGH (ref 70–99)
Potassium: 4.3 mmol/L (ref 3.5–5.1)
Sodium: 140 mmol/L (ref 135–145)

## 2020-05-17 LAB — PREPARE FRESH FROZEN PLASMA
Unit division: 0
Unit division: 0
Unit division: 0
Unit division: 0
Unit division: 0

## 2020-05-17 LAB — CBC WITH DIFFERENTIAL/PLATELET
Abs Immature Granulocytes: 0.24 10*3/uL — ABNORMAL HIGH (ref 0.00–0.07)
Basophils Absolute: 0 10*3/uL (ref 0.0–0.1)
Basophils Relative: 1 %
Eosinophils Absolute: 0 10*3/uL (ref 0.0–0.5)
Eosinophils Relative: 0 %
HCT: 27 % — ABNORMAL LOW (ref 39.0–52.0)
Hemoglobin: 8.8 g/dL — ABNORMAL LOW (ref 13.0–17.0)
Immature Granulocytes: 4 %
Lymphocytes Relative: 4 %
Lymphs Abs: 0.3 10*3/uL — ABNORMAL LOW (ref 0.7–4.0)
MCH: 29.1 pg (ref 26.0–34.0)
MCHC: 32.6 g/dL (ref 30.0–36.0)
MCV: 89.4 fL (ref 80.0–100.0)
Monocytes Absolute: 0.6 10*3/uL (ref 0.1–1.0)
Monocytes Relative: 9 %
Neutro Abs: 5.5 10*3/uL (ref 1.7–7.7)
Neutrophils Relative %: 82 %
Platelets: 188 10*3/uL (ref 150–400)
RBC: 3.02 MIL/uL — ABNORMAL LOW (ref 4.22–5.81)
RDW: 18 % — ABNORMAL HIGH (ref 11.5–15.5)
WBC: 6.1 10*3/uL (ref 4.0–10.5)
nRBC: 2.3 % — ABNORMAL HIGH (ref 0.0–0.2)

## 2020-05-17 LAB — TYPE AND SCREEN
ABO/RH(D): O POS
Antibody Screen: NEGATIVE
Unit division: 0
Unit division: 0
Unit division: 0
Unit division: 0

## 2020-05-17 LAB — BPAM FFP
Blood Product Expiration Date: 202105262359
Blood Product Expiration Date: 202105262359
Blood Product Expiration Date: 202105272359
Blood Product Expiration Date: 202105272359
Blood Product Expiration Date: 202105292359
Blood Product Expiration Date: 202105292359
Blood Product Expiration Date: 202105292359
Blood Product Expiration Date: 202105292359
Blood Product Expiration Date: 202105292359
Blood Product Expiration Date: 202105292359
Blood Product Expiration Date: 202105292359
Blood Product Expiration Date: 202105292359
ISSUE DATE / TIME: 202105220038
ISSUE DATE / TIME: 202105220038
ISSUE DATE / TIME: 202105220038
ISSUE DATE / TIME: 202105241307
ISSUE DATE / TIME: 202105241653
ISSUE DATE / TIME: 202105241937
ISSUE DATE / TIME: 202105241937
ISSUE DATE / TIME: 202105241536
ISSUE DATE / TIME: 202105241536
ISSUE DATE / TIME: 202105241536
ISSUE DATE / TIME: 202105241937
ISSUE DATE / TIME: 202105241937
Unit Type and Rh: 5100
Unit Type and Rh: 5100
Unit Type and Rh: 5100
Unit Type and Rh: 5100
Unit Type and Rh: 5100
Unit Type and Rh: 5100
Unit Type and Rh: 5100
Unit Type and Rh: 5100
Unit Type and Rh: 5100
Unit Type and Rh: 5100
Unit Type and Rh: 6200
Unit Type and Rh: 6200

## 2020-05-17 LAB — BPAM RBC
Blood Product Expiration Date: 202106172359
Blood Product Expiration Date: 202106172359
Blood Product Expiration Date: 202106232359
Blood Product Expiration Date: 202106232359
ISSUE DATE / TIME: 202105241536
ISSUE DATE / TIME: 202105241536
ISSUE DATE / TIME: 202105241825
ISSUE DATE / TIME: 202105241825
Unit Type and Rh: 5100
Unit Type and Rh: 5100
Unit Type and Rh: 5100
Unit Type and Rh: 5100

## 2020-05-17 LAB — PREPARE PLATELET PHERESIS: Unit division: 0

## 2020-05-17 LAB — GLUCOSE, CAPILLARY
Glucose-Capillary: 165 mg/dL — ABNORMAL HIGH (ref 70–99)
Glucose-Capillary: 157 mg/dL — ABNORMAL HIGH (ref 70–99)
Glucose-Capillary: 139 mg/dL — ABNORMAL HIGH (ref 70–99)
Glucose-Capillary: 102 mg/dL — ABNORMAL HIGH (ref 70–99)
Glucose-Capillary: 120 mg/dL — ABNORMAL HIGH (ref 70–99)
Glucose-Capillary: 121 mg/dL — ABNORMAL HIGH (ref 70–99)

## 2020-05-17 LAB — BPAM PLATELET PHERESIS
Blood Product Expiration Date: 202105262359
ISSUE DATE / TIME: 202105241536
Unit Type and Rh: 5100

## 2020-05-17 LAB — URINE CULTURE: Culture: >=100000 — AB

## 2020-05-17 LAB — FERRITIN: Ferritin: 135 ng/mL (ref 24–336)

## 2020-05-17 LAB — TRIGLYCERIDES: Triglycerides: 74 mg/dL (ref <150)

## 2020-05-17 MED ORDER — OXYCODONE HCL 5 MG PO TABS: 5.0000 mg | ORAL_TABLET | ORAL | Status: DC | PRN

## 2020-05-17 MED ORDER — PROPOFOL 1000 MG/100ML IV EMUL
5.0000 ug/kg/min | INTRAVENOUS | Status: DC
  Administered 2020-05-17: 10 ug/kg/min via INTRAVENOUS
  Filled 2020-05-17: qty 100

## 2020-05-17 MED ORDER — PANTOPRAZOLE SODIUM 40 MG PO PACK
40.0000 mg | PACK | Freq: Every day | ORAL | Status: DC
  Administered 2020-05-17 – 2020-05-18 (×2): 40 mg
  Filled 2020-05-17 (×2): qty 20

## 2020-05-17 MED ORDER — ACETAMINOPHEN 325 MG PO TABS: 650.0000 mg | ORAL_TABLET | ORAL | Status: DC | PRN

## 2020-05-17 MED ORDER — ONDANSETRON HCL 4 MG PO TABS: 4.0000 mg | ORAL_TABLET | Freq: Four times a day (QID) | ORAL | Status: DC | PRN

## 2020-05-17 MED ORDER — FUROSEMIDE 40 MG PO TABS
40.0000 mg | ORAL_TABLET | Freq: Every day | ORAL | Status: DC
  Administered 2020-05-17 – 2020-05-18 (×2): 40 mg
  Filled 2020-05-17 (×2): qty 1

## 2020-05-17 MED ORDER — CARVEDILOL 3.125 MG PO TABS
3.1250 mg | ORAL_TABLET | Freq: Two times a day (BID) | ORAL | Status: DC
  Administered 2020-05-17 (×2): 3.125 mg
  Filled 2020-05-17: qty 1

## 2020-05-17 MED ORDER — OXYCODONE HCL 5 MG PO TABS
10.0000 mg | ORAL_TABLET | ORAL | Status: DC | PRN
  Administered 2020-05-17 – 2020-05-27 (×5): 10 mg
  Filled 2020-05-17 (×5): qty 2

## 2020-05-17 MED ORDER — DIAZEPAM 5 MG PO TABS
5.0000 mg | ORAL_TABLET | Freq: Four times a day (QID) | ORAL | Status: DC | PRN
  Administered 2020-05-17 – 2020-05-18 (×2): 5 mg
  Filled 2020-05-17 (×2): qty 1

## 2020-05-17 MED ORDER — SIMVASTATIN 20 MG PO TABS
20.0000 mg | ORAL_TABLET | Freq: Every day | ORAL | Status: DC
  Administered 2020-05-17 – 2020-05-21 (×3): 20 mg
  Filled 2020-05-17 (×3): qty 1

## 2020-05-17 MED ORDER — ACETAMINOPHEN 650 MG RE SUPP: 650.0000 mg | RECTAL | Status: DC | PRN

## 2020-05-17 MED ORDER — ONDANSETRON HCL 4 MG/2ML IJ SOLN: 4.0000 mg | Freq: Four times a day (QID) | INTRAMUSCULAR | Status: DC | PRN

## 2020-05-17 MED ORDER — TRAMADOL HCL 50 MG PO TABS: 50.0000 mg | ORAL_TABLET | Freq: Four times a day (QID) | ORAL | Status: DC | PRN

## 2020-05-17 MED ORDER — LACTATED RINGERS IV BOLUS
500.0000 mL | Freq: Once | INTRAVENOUS | Status: AC
  Administered 2020-05-17: 500 mL via INTRAVENOUS

## 2020-05-17 MED ORDER — LEVETIRACETAM 500 MG PO TABS: 1000.0000 mg | ORAL_TABLET | Freq: Two times a day (BID) | ORAL | Status: DC

## 2020-05-17 MED ORDER — LEVETIRACETAM 100 MG/ML PO SOLN
1000.0000 mg | Freq: Two times a day (BID) | ORAL | Status: DC
  Administered 2020-05-17 – 2020-05-18 (×3): 1000 mg
  Filled 2020-05-17 (×3): qty 10

## 2020-05-17 NOTE — Progress Notes (Signed)
PT Cancellation Note  Patient Details Name: [facility]. MRN: [id number] DOB: [birth year]   Cancelled Treatment:    Reason Eval/Treat Not Completed: Patient not medically ready. Pt underwent T4-8 stabilization yesterday. Pt currently intubated and on a small amount of sedation with no command follow. PT to return as able, as appropriate to complete PT eval.  Kittie Plater, PT, DPT Acute Rehabilitation Services Pager #: [phone] Office #: [phone]    Berline Lopes 05/17/2020, 8:31 AM

## 2020-05-17 NOTE — Progress Notes (Signed)
NAME:  Louis Ford., MRN:  [id number], DOB:  07-12-1946, LOS: 4 ADMISSION DATE:  05/20/2020, CONSULTATIO care unit N DATE:  05/17/20 REFERRING MD:  Neurosurgery, CHIEF COMPLAINT:   Fall  Brief History   74 year old male who presented to the emergency department 5/21 after a fall while walking to the bathroom was found to have pathologic compression fracture of T6 and large volume spinal epidural hemorrhage requiring hematoma evacuation and thoracic laminectomy.  He was intubated for procedure and transferred to the intensive care unit postop.  History of present illness   74 year old male with a history of hypertension, hyperlipidemia, CAD s/p CABG 2011, ischemic cardiomyopathy HFrEF, AICD, atrial fibrillation, CVA, seizure disorder, factor VII deficiency, not clearly diagnosed bleeding diathesis despite extensive work-up who was walking to the bathroom with his walker when he lost his balance and fell to seated position after his legs just gave out on him.  He was having significant pain on presentation to the ED and was found to have pathologic compression fracture of T6  large volume of Spinal Epidural Hemorrhage spanning from the cervical spine throughout the thoracic spine to the conus.  Widespread spinal cord mass-effect.  Neurosurgery and hematology evaluated, recommendation for 3 units FFP, 1 unit of platelets and 1 g of TXA  preprocedure, 1 unit platelets, 1 g TXA post procedure with additional FFP depending on volume status.  Neurosurgery performed T6 laminectomy and epidural clot evacuation.  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   Significant Hospital Events   5/21 admit to neurosurgery 5/22 to ICU postop 5/24 - OR for T4-T8 arthrodesis. 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   Micro Data:  5/21 MRSA screen>> negative 5/21 SARS-Cov-2>> negative 5/21 urine culture>>   Antimicrobials:  Ceftriaxone 5/21-  Interim history/subjective:  Sedated and intubated post-op.  Objective   Blood pressure 93/73, pulse 97, temperature 98.7 F (37.1 C), temperature source Axillary, resp. rate 18, height 6' 2"  (1.88 m), weight 87 kg, SpO2 100 %.    Vent Mode: PSV;CPAP FiO2 (%):  [40 %-60 %] 40 % Set Rate:  [16 bmp] 16 bmp Vt Set:  [660 mL] 660 mL PEEP:  [5 cmH20] 5 cmH20 Pressure Support:  [12 cmH20] 12  cmH20 Plateau Pressure:  [18 cmH20-24 cmH20] 18 cmH20   Intake/Output Summary (Last 24 hours) at 05/17/2020 8889 Last data filed at 05/17/2020 0700 Gross per 24 hour  Intake 4874.05 ml  Output 2100 ml  Net 2774.05 ml   Filed Weights   05/14/20 1200 05/03/2020 1436  Weight: 87 kg 87 kg    General: Elderly male, agitated. In pain. HEENT: MM pink/moist,  Neuro: Pupils 2 mm equal and reactive to light, breathing over the vent, 4/5 strength upper extremities.  Improving strength in lower extremities.  Still weaker on the right side. CV: s1s2 RRR, no m/r/g PULM: CTA B.   GI: soft, bsx4 active  Extremities: warm/dry, no edema  Skin: no rashes or lesions   Resolved Hospital Problem list     Assessment & Plan:   Critically ill due to respiratory failure requiring mechanical ventilation. - Sedation vacation with SBT today - Extubate if tolerates  Fall with pathologic T6 fracture and epidural hematoma causing cord compression Status post T6 laminectomy and spine stabilization 5/25 - Complete myeloma workup.  Bleeding diathesis, without definitive diagnosis, epidural tumor noted intraoperatively.  Received factor transfusions intraoperatively. -Extensive work-up by Dr. Irene Limbo and at [facility] by [name] -Work-up for possible multiple myeloma per hematology -pathology pending -Consider TXA infusion if requires further OR -Empirically transfuse FFP. -Consider tag to better define clotting disorder.  Agitation with delirium - pain related.  - Sedation vacation. - Ensure adequate analgesia.   History of seizure disorder Sound like both tonic-clonic, followed by neurology  HFrEF, CAD s/p CABG, AICD, atrial fibrillation, hypertension, hyperlipidemia Last echocardiogram in 03/2020 with EF of 30 to 35%, not chronically anti-coagulated given bleeding disorder -Resume Coreg, amiodarone once extubated. -Continue Lasix, monitor volume status and I&O's   Daily Goals Checklist   Pain/Anxiety/Delirium protocol (if indicated):  Propofol off, morphine prn Neuro vitals: every 2 hours AED's: Continue home AED's VAP protocol (if indicated): bundle in place Respiratory support goals: SBT and plan to extubate today.  Blood pressure target: NE to keep MAP>70 for spinal cord perfusion.  DVT prophylaxis: SCD's only due to bleeding diathesis.  Nutrition Status: Feeds on hold pending extubation.  GI prophylaxis: pantoprazole Fluid status goals: 500 bolus today given rising creatinine Urinary catheter: Condom Central lines: L CVC Glucose control: adequate control of T2DM with SSI. Mobility/therapy needs: Bedrest then PT once extubated.  Antibiotic de-escalation: Perioperative antibiotics only Home medication reconciliation:  Home AED, HF meds on hold Daily labs: CBC, BMP Code Status: Full  Family Communication: per Neurosurgery Disposition: ICU    Labs   CBC: Recent Labs  Lab 05/12/2020 1531 05/21/2020 1551 05/15/20 1717 05/15/20 1717 05/22/2020 0201 05/04/2020 0958 04/28/2020 1758 04/24/2020 2101 05/17/20 0333  WBC 3.5*   < > 7.8  --  8.0 7.9  --  6.1 6.1  NEUTROABS 2.6  --   --   --   --   --   --   --  5.5  HGB 12.6*   < > 8.5*   < > 8.4* 8.7* 9.2* 8.8* 8.8*  HCT 41.5   < > 28.2*   < > 27.8* 28.3* 27.0* 27.8* 27.0*  MCV 91.4   < > 92.2  --  93.0 93.1  --  91.7 89.4  PLT 191   < > 207  --  208 202  --  179 188   < > = values in this interval not displayed.    Basic Metabolic Panel: Recent Labs  Lab 05/09/2020  1531 05/23/2020 1531 05/01/2020 1551 05/02/2020 1551 05/14/20 0114 05/14/20 0159 05/14/20 0336 05/14/20 0654 05/22/2020 1758 05/17/20 0333  NA 135   < > 139   < > 141 140 141  --  145 140  K 4.0   < > 3.6   < > 3.9 3.8 3.6  --  4.4 4.3  CL 104  --  104  --   --   --   --   --   --  111  CO2 23  --   --   --   --   --   --   --   --  21*  GLUCOSE 129*  --  122*  --   --   --   --   --   --  166*  BUN 36*  --  41*  --   --   --   --   --   --  24*   CREATININE 1.51*  --  1.40*  --   --   --   --   --   --  1.52*  CALCIUM 8.4*  --   --   --   --   --   --   --   --  8.1*  MG  --   --   --   --   --   --   --  1.5*  --   --   PHOS  --   --   --   --   --   --   --  4.1  --   --    < > = values in this interval not displayed.   GFR: Estimated Creatinine Clearance: 51.8 mL/min (A) (by C-G formula based on SCr of 1.52 mg/dL (H)). Recent Labs  Lab 04/23/2020 0201 05/12/2020 0958 05/23/2020 2101 05/17/20 0333  WBC 8.0 7.9 6.1 6.1    Liver Function Tests: Recent Labs  Lab 04/25/2020 1531  AST 36  ALT 28  ALKPHOS 99  BILITOT 1.0  PROT 10.8*  ALBUMIN 2.9*   No results for input(s): LIPASE, AMYLASE in the last 168 hours. No results for input(s): AMMONIA in the last 168 hours.  ABG    Component Value Date/Time   PHART 7.334 (L) 05/07/2020 1758   PCO2ART 45.1 05/07/2020 1758   PO2ART 73 (L) 04/23/2020 1758   HCO3 24.0 04/30/2020 1758   TCO2 25 05/19/2020 1758   ACIDBASEDEF 2.0 04/29/2020 1758   O2SAT 93.0 05/09/2020 1758     Coagulation Profile: Recent Labs  Lab 05/15/2020 1531 05/14/20 0654 05/22/2020 2101  INR 1.4* 1.3* 1.4*    Cardiac Enzymes: No results for input(s): CKTOTAL, CKMB, CKMBINDEX, TROPONINI in the last 168 hours.  HbA1C: Hgb A1c MFr Bld  Date/Time Value Ref Range Status  05/14/2020 06:54 AM 6.1 (H) 4.8 - 5.6 % Final    Comment:    (NOTE) Pre diabetes:          5.7%-6.4% Diabetes:              >6.4% Glycemic control for   <7.0% adults with diabetes   07/25/2019 08:22 PM 5.2 4.8 - 5.6 % Final    Comment:    (NOTE) Pre diabetes:          5.7%-6.4% Diabetes:              >6.4% Glycemic control for   <7.0% adults with diabetes     CBG: Recent  Labs  Lab 05/02/2020 1128 04/27/2020 2058 05/14/2020 2325 05/17/20 0328 05/17/20 0719  GLUCAP 98 120* 126* 165* 157*   CRITICAL CARE Performed by: Kipp Brood   Total critical care time: 45 minutes  Critical care time was exclusive of separately  billable procedures and treating other patients.  Critical care was necessary to treat or prevent imminent or life-threatening deterioration.  Critical care was time spent personally by me on the following activities: development of treatment plan with patient and/or surrogate as well as nursing, discussions with consultants, evaluation of patient's response to treatment, examination of patient, obtaining history from patient or surrogate, ordering and performing treatments and interventions, ordering and review of laboratory studies, ordering and review of radiographic studies, pulse oximetry, re-evaluation of patient's condition and participation in multidisciplinary rounds.  Kipp Brood, MD [facility] ICU Physician [place]  Pager: [phone] Mobile: [phone] After hours: [phone].

## 2020-05-17 NOTE — Progress Notes (Signed)
Patient ID: Louis Ford., male   DOB: [birth year], 74 y.o.   MRN: [id number] BP 94/72 (BP Location: Left Arm)   Pulse (!) 56   Temp 97.7 F (36.5 C) (Axillary)   Resp (!) 25   Ht 6\' 2"  (1.88 m) Comment: Measured by RT  Wt 87 kg   SpO2 91%   BMI 24.63 kg/m  Weaning on ventilator. Moving all extremities Called pathology today, no answer. At 1243.  Wound is clean, dry, no signs of infection. Drain working well

## 2020-05-17 NOTE — Progress Notes (Signed)
Upon initial assessment patient was not moving extremities to painful stimulus.  Paused propofol for 30 minutes and then suctioned patient.  Patient began to cough and move all extremities.

## 2020-05-17 NOTE — Addendum Note (Signed)
Addendum  created 05/17/20 0810 by Josephine Igo, CRNA   Order list changed

## 2020-05-17 NOTE — Progress Notes (Signed)
eLink Physician-Brief Progress Note Patient Name: [facility]. DOB: Nov 23, 1946 MRN: [id number]   Date of Service  05/17/2020  HPI/Events of Note  Asking for propofol orders to be placed, came from OT post op on it.   eICU Interventions  Propofol ordered. Watch for hypotension.     Intervention Category Intermediate Interventions: Other:  Elmer Sow 05/17/2020, 1:03 AM

## 2020-05-17 NOTE — Plan of Care (Signed)
  Problem: Clinical Measurements: Goal: Ability to maintain clinical measurements within normal limits will improve Outcome: Progressing Goal: Will remain free from infection Outcome: Progressing Goal: Diagnostic test results will improve Outcome: Progressing Goal: Respiratory complications will improve Outcome: Progressing Goal: Cardiovascular complication will be avoided Outcome: Progressing   Problem: Activity: Goal: Risk for activity intolerance will decrease Outcome: Progressing   Problem: Nutrition: Goal: Adequate nutrition will be maintained Outcome: Progressing   Problem: Pain Managment: Goal: General experience of comfort will improve Outcome: Progressing

## 2020-05-17 NOTE — Progress Notes (Signed)
OT Cancellation Note  Patient Details Name: [name]. MRN: [id number] DOB: September 25, 1946   Cancelled Treatment:    Reason Eval/Treat Not Completed: Patient not medically ready; pt with spinal sx yesterday (T4-8 stabilization), currently remains intubated and sedated with no command follow. Will follow up for OT eval as able.  Lou Cal, OT Acute Rehabilitation Services Pager [phone] Office [phone]   Raymondo Band 05/17/2020, 9:06 AM

## 2020-05-18 ENCOUNTER — Inpatient Hospital Stay (HOSPITAL_COMMUNITY): Payer: Medicare Other

## 2020-05-18 ENCOUNTER — Encounter: Payer: Self-pay | Admitting: *Deleted

## 2020-05-18 DIAGNOSIS — R569 Unspecified convulsions: Secondary | ICD-10-CM

## 2020-05-18 DIAGNOSIS — J9601 Acute respiratory failure with hypoxia: Secondary | ICD-10-CM

## 2020-05-18 LAB — PROTEIN ELECTROPHORESIS, SERUM
A/G Ratio: 0.6 — ABNORMAL LOW (ref 0.7–1.7)
Albumin ELP: 2.9 g/dL (ref 2.9–4.4)
Alpha-1-Globulin: 0.3 g/dL (ref 0.0–0.4)
Alpha-2-Globulin: 0.5 g/dL (ref 0.4–1.0)
Beta Globulin: 0.6 g/dL — ABNORMAL LOW (ref 0.7–1.3)
Gamma Globulin: 3.4 g/dL — ABNORMAL HIGH (ref 0.4–1.8)
Globulin, Total: 4.8 g/dL — ABNORMAL HIGH (ref 2.2–3.9)
M-Spike, %: 3 g/dL — ABNORMAL HIGH
Total Protein ELP: 7.7 g/dL (ref 6.0–8.5)

## 2020-05-18 LAB — GLUCOSE, CAPILLARY
Glucose-Capillary: 15 mg/dL — CL (ref 70–99)
Glucose-Capillary: 44 mg/dL — CL (ref 70–99)
Glucose-Capillary: 106 mg/dL — ABNORMAL HIGH (ref 70–99)
Glucose-Capillary: 120 mg/dL — ABNORMAL HIGH (ref 70–99)
Glucose-Capillary: 117 mg/dL — ABNORMAL HIGH (ref 70–99)
Glucose-Capillary: 117 mg/dL — ABNORMAL HIGH (ref 70–99)
Glucose-Capillary: 107 mg/dL — ABNORMAL HIGH (ref 70–99)
Glucose-Capillary: 109 mg/dL — ABNORMAL HIGH (ref 70–99)

## 2020-05-18 LAB — BETA 2 MICROGLOBULIN, SERUM: Beta-2 Microglobulin: 3.7 mg/L — ABNORMAL HIGH (ref 0.6–2.4)

## 2020-05-18 LAB — KAPPA/LAMBDA LIGHT CHAINS
Kappa free light chain: 15.2 mg/L (ref 3.3–19.4)
Kappa, lambda light chain ratio: 3.1 — ABNORMAL HIGH (ref 0.26–1.65)
Lambda free light chains: 4.9 mg/L — ABNORMAL LOW (ref 5.7–26.3)

## 2020-05-18 LAB — TRIGLYCERIDES: Triglycerides: 74 mg/dL (ref <150)

## 2020-05-18 LAB — SURGICAL PATHOLOGY

## 2020-05-18 MED ORDER — PROPOFOL 1000 MG/100ML IV EMUL
INTRAVENOUS | Status: AC
  Administered 2020-05-18: 5 ug/kg/min via INTRAVENOUS
  Filled 2020-05-18: qty 100

## 2020-05-18 MED ORDER — ORAL CARE MOUTH RINSE
15.0000 mL | Freq: Two times a day (BID) | OROMUCOSAL | Status: DC
  Administered 2020-05-18 – 2020-05-19 (×3): 15 mL via OROMUCOSAL

## 2020-05-18 MED ORDER — LEVETIRACETAM IN NACL 1000 MG/100ML IV SOLN
1000.0000 mg | Freq: Two times a day (BID) | INTRAVENOUS | Status: DC
  Administered 2020-05-18 – 2020-05-20 (×4): 1000 mg via INTRAVENOUS
  Filled 2020-05-18 (×4): qty 100

## 2020-05-18 MED ORDER — VALPROIC ACID 250 MG/5ML PO SOLN
125.0000 mg | Freq: Four times a day (QID) | ORAL | Status: DC
  Administered 2020-05-18 (×2): 125 mg
  Filled 2020-05-18: qty 5

## 2020-05-18 MED ORDER — VALPROIC ACID 250 MG/5ML PO SOLN
250.0000 mg | Freq: Two times a day (BID) | ORAL | Status: DC
  Filled 2020-05-18: qty 5

## 2020-05-18 MED ORDER — FUROSEMIDE 10 MG/ML IJ SOLN
40.0000 mg | Freq: Once | INTRAMUSCULAR | Status: AC
  Administered 2020-05-18: 40 mg via INTRAVENOUS
  Filled 2020-05-18: qty 4

## 2020-05-18 MED ORDER — VALPROATE SODIUM 500 MG/5ML IV SOLN
125.0000 mg | Freq: Four times a day (QID) | INTRAVENOUS | Status: DC
  Administered 2020-05-18 – 2020-05-20 (×7): 125 mg via INTRAVENOUS
  Filled 2020-05-18 (×10): qty 1.25

## 2020-05-18 MED ORDER — POLYETHYLENE GLYCOL 3350 17 G PO PACK
17.0000 g | PACK | Freq: Every day | ORAL | Status: DC
  Administered 2020-05-18: 17 g
  Filled 2020-05-18 (×2): qty 1

## 2020-05-18 MED ORDER — PHENYTOIN SODIUM 50 MG/ML IJ SOLN
100.0000 mg | Freq: Two times a day (BID) | INTRAMUSCULAR | Status: DC
  Administered 2020-05-18 – 2020-05-26 (×16): 100 mg via INTRAVENOUS
  Filled 2020-05-18 (×17): qty 2

## 2020-05-18 MED ORDER — ETOMIDATE 2 MG/ML IV SOLN
30.0000 mg | INTRAVENOUS | Status: AC
  Administered 2020-05-18: 30 mg via INTRAVENOUS

## 2020-05-18 MED ORDER — PROPOFOL 1000 MG/100ML IV EMUL: 5.0000 ug/kg/min | INTRAVENOUS | Status: DC

## 2020-05-18 MED ORDER — SENNOSIDES-DOCUSATE SODIUM 8.6-50 MG PO TABS
1.0000 | ORAL_TABLET | Freq: Two times a day (BID) | ORAL | Status: DC
  Administered 2020-05-18 – 2020-05-26 (×3): 1
  Filled 2020-05-18 (×6): qty 1

## 2020-05-18 NOTE — Procedures (Signed)
Procedure: Endotracheal intubation Diagnosis: Acute hypoxic respiratory failure   I was called to come and assess the patient for possible cuff leak and blood cough and on my arrival endotracheal tube was pushed in and there was no air leak but patient was not moving any air pulled back and same thing there is no tidal volume moving.  Suction catheter was not able to be advanced either so decision was made to extubate the patient and reintubate.  Patient was given 30 mg of etomidate and he was already on sedation endotracheal tube was removed and he was intubated with a glide scope from first attempt smoothly patient was saturating 97% during the whole procedure and tolerated well.  Position of the endotracheal tube was confirmed with auscultation and CO2 detector.  Chest x-ray was ordered for confirmation.

## 2020-05-18 NOTE — Anesthesia Postprocedure Evaluation (Signed)
Anesthesia Post Note  Patient: [facility].  Procedure(s) Performed: THORACIC LAMINECTOMY FOR HEMATOMA EVACUATION (N/A Back)     Patient location during evaluation: SICU Anesthesia Type: General Level of consciousness: sedated and patient remains intubated per anesthesia plan Pain management: pain level controlled Vital Signs Assessment: post-procedure vital signs reviewed and stable Respiratory status: patient remains intubated per anesthesia plan and patient on ventilator - see flowsheet for VS Cardiovascular status: stable Postop Assessment: no apparent nausea or vomiting Anesthetic complications: no    Last Vitals:  Vitals:   05/18/20 1600 05/18/20 1700  BP: (!) 117/91 121/80  Pulse:  74  Resp: (!) 24 18  Temp: 36.7 C   SpO2: 100% 100%    Last Pain:  Vitals:   05/18/20 1600  TempSrc: Axillary  PainSc:                  JOSLIN,DAVID COKER

## 2020-05-18 NOTE — Procedures (Signed)
Extubation Procedure Note  Patient Details:   Name: Louis Ford. DOB: 11/24/1946 MRN: [id number]   Airway Documentation:    Vent end date: 05/18/20 Vent end time: 1522   Evaluation  O2 sats: stable throughout Complications: No apparent complications Patient did tolerate procedure well. Bilateral Breath Sounds: Clear, Diminished   No   Pt extubated per CCM order. Pt is unable to follow commands but is able to sit upright in bed. Cuff leak heard prior to extubation. Pt was tolerating 5/5 well with minute ventilation of 10.5. Pt is unable to speak name due to inability to follow commands. No cough noted at this time. RN aware.   Louis Ford T 05/18/2020, 3:27 PM

## 2020-05-18 NOTE — Progress Notes (Signed)
EEG completed, results pending. 

## 2020-05-18 NOTE — Progress Notes (Signed)
PT Cancellation Note  Patient Details Name: Louis Ford. MRN: [id number] DOB: [birth year]   Cancelled Treatment:    Reason Eval/Treat Not Completed: Medical issues which prohibited therapy. Pt unable to maintain arousal despite being off of sedation. Pt unable to follow commands at this time per RN. Pt opens eyes for 2 seconds with aggressive verbal and tactile stimulation and then immediately closes them. PT will attempt to follow up when the patient is more alert and better able to participate in PT POC.   Zenaida Niece 05/18/2020, 11:03 AM

## 2020-05-18 NOTE — Progress Notes (Signed)
eLink Physician-Brief Progress Note Patient Name: Louis Ford. DOB: March 17, 1946 MRN: [id number]   Date of Service  05/18/2020  HPI/Events of Note  Ventilator dyssynchrony  eICU Interventions  Propofol infusion ordered.        Kerry Kass Ogan 05/18/2020, 4:29 AM

## 2020-05-18 NOTE — Progress Notes (Signed)
OT Cancellation Note  Patient Details Name: [facility]. MRN: [id number] DOB: [birth year]   Cancelled Treatment:    Reason Eval/Treat Not Completed: Medical issues which prohibited therapy; pt remains on vent however remains very difficult to arouse even with sedation lessened. Noted plans for EEG today. Will follow up for OT eval as pt medically ready and able to participate.   Lou Cal, OT Acute Rehabilitation Services Pager [phone] Office [phone]   Raymondo Band 05/18/2020, 2:11 PM

## 2020-05-18 NOTE — Progress Notes (Signed)
NAME:  Louis Alms., MRN:  [id number], DOB:  [birth year], LOS: 5 ADMISSION DATE:  04/26/2020, CONSULTATIO care unit N DATE:  05/18/20 REFERRING MD:  Neurosurgery, CHIEF COMPLAINT:   Fall  Brief History   74 year old male who presented to the emergency department 5/21 after a fall while walking to the bathroom was found to have pathologic compression fracture of T6 and large volume spinal epidural hemorrhage requiring hematoma evacuation and thoracic laminectomy.  He was intubated for procedure and transferred to the intensive care unit postop.  History of present illness   74 year old male with a history of hypertension, hyperlipidemia, CAD s/p CABG 2011, ischemic cardiomyopathy HFrEF, AICD, atrial fibrillation, CVA, seizure disorder, factor VII deficiency, not clearly diagnosed bleeding diathesis despite extensive work-up who was walking to the bathroom with his walker when he lost his balance and fell to seated position after his legs just gave out on him.  He was having significant pain on presentation to the ED and was found to have pathologic compression fracture of T6  large volume of Spinal Epidural Hemorrhage spanning from the cervical spine throughout the thoracic spine to the conus.  Widespread spinal cord mass-effect.  Neurosurgery and hematology evaluated, recommendation for 3 units FFP, 1 unit of platelets and 1 g of TXA  preprocedure, 1 unit platelets, 1 g TXA post procedure with additional FFP depending on volume status.  Neurosurgery performed T6 laminectomy and epidural clot evacuation.  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  He was transferred to the intensive care unit intubated and sedated, PCCM asked to consult  Past Medical History   has a past medical history of AICD (automatic cardioverter/defibrillator) present, Atrial fibrillation   (09/22/2012), Blood loss anemia (04/18/2017), CAD (coronary artery disease), Chronic systolic heart failure ([place]),  Factor VII deficiency ([employer]) (05/2011), Factor VII deficiency ([place]) (10/07/2012), GIB (gastrointestinal bleeding) (02/20/2017), History of MRSA infection (05/2011), HTN (hypertension), adenomatous colonic polyps (02/20/2017), Hyperlipidemia, implantable cardiac defibrillator-Biotronik, Ischemic cardiomyopathy, Obesity, Persistent atrial fibrillation with rapid ventricular response ([place]) (04/21/2014), Retroperitoneal bleed (04/21/2019), Seizure disorder ([place]) (latest 09/30/2012), Seizures ([place]), and Stroke ([place]).   Significant Hospital Events   5/21 admit to neurosurgery 5/22 to ICU postop 5/24 - OR for T4-T8 arthrodesis. 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   Micro Data:  5/21 MRSA screen>> negative 5/21 SARS-Cov-2>> negative 5/21 urine culture>>   Antimicrobials:  Ceftriaxone 5/21-  Interim history/subjective:  Remains encephalopathic following surgery. Weans but not waking up sufficiently to extubate.   Objective   Blood pressure 111/69, pulse 87, temperature 98.7 F (37.1 C), temperature source Oral, resp. rate 18, height 6' 2"  (1.88 m), weight 87 kg, SpO2 100 %.    Vent Mode: PSV;CPAP FiO2 (%):  [40 %] 40 % Set Rate:  [16 bmp] 16 bmp Vt Set:  [846 mL] 660 mL PEEP:  [5  cmH20]  5 cmH20 Pressure Support:  [8 cmH20-15 cmH20] 15 cmH20 Plateau Pressure:  [22 cmH20] 22 cmH20   Intake/Output Summary (Last 24 hours) at 05/18/2020 0813 Last data filed at 05/18/2020 0700 Gross per 24 hour  Intake 2251.04 ml  Output 425 ml  Net 1826.04 ml   Filed Weights   05/14/20 1200 05/15/2020 1436  Weight: 87 kg 87 kg    General: Elderly male, agitated. In pain. HEENT: MM pink/moist,  ETT/OGT in place Neuro: Pupils 2 mm equal and reactive to light, breathing over the vent, 4/5 strength upper extremities.  Improving strength in lower extremities.  Still weaker on the right side. CV: s1s2 RRR, no m/r/g PULM: CTA B.  On SBT GI: soft, bsx4 active  Extremities: warm/dry, no edema  Skin: no rashes or lesions   Resolved Hospital Problem list     Assessment & Plan:   Toxic metabolic encephalopathy which is preventing extubation - EEG to rule out seizures.   Critically ill due to respiratory failure requiring mechanical ventilation. - Sedation vacation with SBT today - Extubate if tolerates  Fall with pathologic T6 fracture and epidural hematoma causing cord compression Status post T6 laminectomy and spine stabilization 5/25 - Complete myeloma workup.  Bleeding diathesis, without definitive diagnosis, epidural tumor noted intraoperatively.  Received factor transfusions intraoperatively. -Extensive work-up by Dr. Irene Limbo and at [facility] by [name] -Work-up for possible multiple myeloma per hematology -pathology pending -Consider TXA infusion if requires further OR -Empirically transfuse FFP. -Consider tag to better define clotting disorder.  History of seizure disorder Sound like both tonic-clonic, followed by neurology  HFrEF, CAD s/p CABG, AICD, atrial fibrillation, hypertension, hyperlipidemia Last echocardiogram in 03/2020 with EF of 30 to 35%, not chronically anti-coagulated given bleeding disorder -Resume Coreg, amiodarone once extubated. -Continue Lasix, monitor  volume status and I&O's   Daily Goals Checklist  Pain/Anxiety/Delirium protocol (if indicated): stopped all except oxycodone Neuro vitals: every 2 hours AED's: Continue home AED's VAP protocol (if indicated): bundle in place Respiratory support goals: SBT and plan to extubate today.  Blood pressure target: NE to keep MAP>65 for spinal cord perfusion.  DVT prophylaxis: SCD's only due to bleeding diathesis.  Nutrition Status: Feeds on hold pending extubation.  GI prophylaxis: pantoprazole Fluid status goals: 500 bolus today given rising creatinine Urinary catheter: Condom Central lines: L CVC Glucose control: adequate control of T2DM with SSI. Mobility/therapy needs: Bedrest then PT once extubated.  Antibiotic de-escalation: Perioperative antibiotics only Home medication reconciliation:  Home AED, HF meds on hold Daily labs: CBC, BMP Code Status: Full  Family Communication: per Neurosurgery Disposition: ICU    Labs   CBC: Recent Labs  Lab 05/15/2020 1531 05/02/2020 1551 05/15/20 1717 05/15/20 1717 05/12/2020 0201 05/08/2020 0958 05/22/2020 1758 04/25/2020 2101 05/17/20 0333  WBC 3.5*   < > 7.8  --  8.0 7.9  --  6.1 6.1  NEUTROABS 2.6  --   --   --   --   --   --   --  5.5  HGB 12.6*   < > 8.5*   < > 8.4* 8.7* 9.2* 8.8* 8.8*  HCT 41.5   < > 28.2*   < > 27.8* 28.3* 27.0* 27.8* 27.0*  MCV 91.4   < > 92.2  --  93.0 93.1  --  91.7 89.4  PLT 191   < > 207  --  208 202  --  179 188   < > = values in this interval not displayed.  Basic Metabolic Panel: Recent Labs  Lab 05/03/2020 1531 04/23/2020 1531 04/25/2020 1551 05/10/2020 1551 05/14/20 0114 05/14/20 0159 05/14/20 0336 05/14/20 0654 05/02/2020 1758 05/17/20 0333  NA 135   < > 139   < > 141 140 141  --  145 140  K 4.0   < > 3.6   < > 3.9 3.8 3.6  --  4.4 4.3  CL 104  --  104  --   --   --   --   --   --  111  CO2 23  --   --   --   --   --   --   --   --  21*  GLUCOSE 129*  --  122*  --   --   --   --   --   --  166*  BUN 36*   --  41*  --   --   --   --   --   --  24*  CREATININE 1.51*  --  1.40*  --   --   --   --   --   --  1.52*  CALCIUM 8.4*  --   --   --   --   --   --   --   --  8.1*  MG  --   --   --   --   --   --   --  1.5*  --   --   PHOS  --   --   --   --   --   --   --  4.1  --   --    < > = values in this interval not displayed.   GFR: Estimated Creatinine Clearance: 51.8 mL/min (A) (by C-G formula based on SCr of 1.52 mg/dL (H)). Recent Labs  Lab 05/01/2020 0201 04/23/2020 0958 05/19/2020 2101 05/17/20 0333  WBC 8.0 7.9 6.1 6.1    Liver Function Tests: Recent Labs  Lab 05/19/2020 1531  AST 36  ALT 28  ALKPHOS 99  BILITOT 1.0  PROT 10.8*  ALBUMIN 2.9*   No results for input(s): LIPASE, AMYLASE in the last 168 hours. No results for input(s): AMMONIA in the last 168 hours.  ABG    Component Value Date/Time   PHART 7.334 (L) 04/29/2020 1758   PCO2ART 45.1 05/18/2020 1758   PO2ART 73 (L) 05/11/2020 1758   HCO3 24.0 04/26/2020 1758   TCO2 25 05/22/2020 1758   ACIDBASEDEF 2.0 05/17/2020 1758   O2SAT 93.0 05/12/2020 1758     Coagulation Profile: Recent Labs  Lab 05/12/2020 1531 05/14/20 0654 05/10/2020 2101  INR 1.4* 1.3* 1.4*    Cardiac Enzymes: No results for input(s): CKTOTAL, CKMB, CKMBINDEX, TROPONINI in the last 168 hours.  HbA1C: Hgb A1c MFr Bld  Date/Time Value Ref Range Status  05/14/2020 06:54 AM 6.1 (H) 4.8 - 5.6 % Final    Comment:    (NOTE) Pre diabetes:          5.7%-6.4% Diabetes:              >6.4% Glycemic control for   <7.0% adults with diabetes   07/25/2019 08:22 PM 5.2 4.8 - 5.6 % Final    Comment:    (NOTE) Pre diabetes:          5.7%-6.4% Diabetes:              >6.4% Glycemic control for   <7.0% adults  with diabetes     CBG: Recent Labs  Lab 05/17/20 1056 05/17/20 1608 05/17/20 1924 05/17/20 2328 05/18/20 0330  GLUCAP 139* 102* 120* 121* 106*   CRITICAL CARE Performed by: Kipp Brood   Total critical care time: 45  minutes  Critical care time was exclusive of separately billable procedures and treating other patients.  Critical care was necessary to treat or prevent imminent or life-threatening deterioration.  Critical care was time spent personally by me on the following activities: development of treatment plan with patient and/or surrogate as well as nursing, discussions with consultants, evaluation of patient's response to treatment, examination of patient, obtaining history from patient or surrogate, ordering and performing treatments and interventions, ordering and review of laboratory studies, ordering and review of radiographic studies, pulse oximetry, re-evaluation of patient's condition and participation in multidisciplinary rounds.  Kipp Brood, MD [facility] ICU Physician [place]  Pager: [phone] Mobile: [phone] After hours: [phone].

## 2020-05-18 NOTE — Procedures (Addendum)
Patient Name: [facility].  MRN: [id number]  Epilepsy Attending: Lora Havens  Referring Physician/Provider: Dr. Dyann Ruddle Date: 05/18/2020 Duration: 23.42 minutes  Patient history: 74 year old male with altered mental status.  EEG evaluate for seizures.  Level of alertness: Comatose  AEDs during EEG study: Keppra, Dilantin, valproic acid, diazepam  Technical aspects: This EEG study was done with scalp electrodes positioned according to the 10-20 International system of electrode placement. Electrical activity was acquired at a sampling rate of 500Hz  and reviewed with a high frequency filter of 70Hz  and a low frequency filter of 1Hz . EEG data were recorded continuously and digitally stored.   Description: EEG showed continuous generalized polymorphic low amplitude mixed frequencies including predominantly 5 to 6 Hz theta activity as well as 2 to 3 Hz delta activity.  Sharp waves were seen in right anterior temporal region, maximal F8-T8.  Hyperventilation and photic stimulation were not performed.     ABNORMALITY -Sharp waves, right anterior temporal region, maximal F8-T8 -Continuous slow, generalized  IMPRESSION: This study showed evidence of epileptogenicity arising from the right anterior temporal region.  Additionally, there is evidence of severe diffuse encephalopathy, nonspecific etiology.  No seizures were seen during the study.      Jamilynn Whitacre [name]

## 2020-05-18 NOTE — Progress Notes (Signed)
Patient ID: Louis Dandy., male   DOB: 1946-07-14, 74 y.o.   MRN: [id number] BP (!) 117/91 (BP Location: Left Arm)   Pulse (!) 124   Temp 98.1 F (36.7 C) (Axillary)   Resp (!) 24   Ht 6\' 2"  (1.88 m) Comment: Measured by RT  Wt 87 kg   SpO2 100%   BMI 24.63 kg/m  Lethargic, breathing on his own.  With stimulation he moans, not following all commands.  He is moving all extremities Chest xray shows all hardware in correct position

## 2020-05-18 NOTE — Progress Notes (Signed)
After putting patient on propofol drip there was no improvement in patient tidal volume and patient began having a gurgling sound upon inspiration. RRT was called and determined patient had a cuff leak and was unable to advance in-line suction tube all the way.  Dr. Elwyn Reach arrived and determined that patient needed to be reintubated with new ETT tube. RN gave 30 mg etomidate for intubation. Vital signs and tidal volumes returned to normal. When original ETT tube was pulled out there were thick, green secretions clogging the bottom 2 in of tube. Patient is currently stable and is synchronous with ventilator. Will continue to monitor patient.

## 2020-05-18 NOTE — Progress Notes (Signed)
Patient began fighting ventilator and became increasingly agitated. RN gave PRN morphine, valium, and oxycodone with no relief and patient continued to fight ventilator. Dr. Lucile Shutters notified and propofol has been ordered and started. Will continue to monitor patient.

## 2020-05-19 DIAGNOSIS — D649 Anemia, unspecified: Secondary | ICD-10-CM

## 2020-05-19 LAB — GLUCOSE, CAPILLARY
Glucose-Capillary: 104 mg/dL — ABNORMAL HIGH (ref 70–99)
Glucose-Capillary: 106 mg/dL — ABNORMAL HIGH (ref 70–99)
Glucose-Capillary: 118 mg/dL — ABNORMAL HIGH (ref 70–99)
Glucose-Capillary: 248 mg/dL — ABNORMAL HIGH (ref 70–99)
Glucose-Capillary: 38 mg/dL — CL (ref 70–99)
Glucose-Capillary: 494 mg/dL — ABNORMAL HIGH (ref 70–99)
Glucose-Capillary: 65 mg/dL — ABNORMAL LOW (ref 70–99)
Glucose-Capillary: 72 mg/dL (ref 70–99)
Glucose-Capillary: 82 mg/dL (ref 70–99)
Glucose-Capillary: 99 mg/dL (ref 70–99)
Glucose-Capillary: 99 mg/dL (ref 70–99)

## 2020-05-19 MED ORDER — FUROSEMIDE 10 MG/ML IJ SOLN
20.0000 mg | Freq: Every day | INTRAMUSCULAR | Status: DC
  Administered 2020-05-19 – 2020-05-20 (×2): 20 mg via INTRAVENOUS
  Filled 2020-05-19 (×2): qty 2

## 2020-05-19 MED ORDER — AMIODARONE IV BOLUS ONLY 150 MG/100ML
150.0000 mg | Freq: Once | INTRAVENOUS | Status: AC
  Administered 2020-05-19: 150 mg via INTRAVENOUS
  Filled 2020-05-19: qty 100

## 2020-05-19 MED ORDER — PANTOPRAZOLE SODIUM 40 MG IV SOLR
40.0000 mg | Freq: Every day | INTRAVENOUS | Status: DC
  Administered 2020-05-19 – 2020-05-20 (×2): 40 mg via INTRAVENOUS
  Filled 2020-05-19 (×2): qty 40

## 2020-05-19 MED ORDER — DEXTROSE 50 % IV SOLN
12.5000 g | Freq: Once | INTRAVENOUS | Status: AC
  Administered 2020-05-19: 12.5 g via INTRAVENOUS

## 2020-05-19 MED ORDER — ACETAMINOPHEN 10 MG/ML IV SOLN: 1000.0000 mg | Freq: Four times a day (QID) | INTRAVENOUS | Status: AC | PRN

## 2020-05-19 MED ORDER — METOPROLOL TARTRATE 5 MG/5ML IV SOLN
10.0000 mg | Freq: Four times a day (QID) | INTRAVENOUS | Status: DC
  Administered 2020-05-19: 10 mg via INTRAVENOUS
  Filled 2020-05-19: qty 10

## 2020-05-19 MED ORDER — METOPROLOL TARTRATE 5 MG/5ML IV SOLN: 10.0000 mg | Freq: Four times a day (QID) | INTRAVENOUS | Status: DC

## 2020-05-19 MED ORDER — METOPROLOL TARTRATE 5 MG/5ML IV SOLN
5.0000 mg | Freq: Four times a day (QID) | INTRAVENOUS | Status: DC
  Administered 2020-05-19 (×2): 5 mg via INTRAVENOUS
  Filled 2020-05-19 (×3): qty 5

## 2020-05-19 MED ORDER — DEXTROSE 50 % IV SOLN
25.0000 g | Freq: Once | INTRAVENOUS | Status: AC
  Administered 2020-05-19: 25 g via INTRAVENOUS
  Filled 2020-05-19: qty 50

## 2020-05-19 MED ORDER — AMIODARONE HCL IN DEXTROSE 360-4.14 MG/200ML-% IV SOLN: 30.0000 mg/h | INTRAVENOUS | Status: DC

## 2020-05-19 NOTE — Evaluation (Signed)
Physical Therapy Evaluation Patient Details Name: [facility] MRN: [id number] DOB: 26-Jun-1946 Today's Date: 05/19/2020   History of Present Illness  74 year old male with a history of hypertension, hyperlipidemia, CAD s/p CABG 2011, ischemic cardiomyopathy HFrEF, AICD, atrial fibrillation, CVA, seizure disorder, factor VII deficiency, not clearly diagnosed bleeding diathesis despite extensive work-up who was walking to the bathroom with his walker when he lost his balance and fell to seated position after his legs just gave out on him.  He was having significant pain on presentation to the ED and was found to have pathologic compression fracture of T6  large volume of Spinal Epidural Hemorrhage spanning from the cervical spine throughout the thoracic spine to the conus.  Widespread spinal cord mass-effect. Neurosurgery performed T6 laminectomy and epidural clot evacuation.  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  He was transferred to the intensive care unit intubated and sedated. 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 Encephalopathic s/p surgery.   Clinical Impression  Patient presents with decreased mobility due to decreased cognition, decreased level of arousal, decreased strength and balance.  Today was only able to open eyes to command x 1, not following other commands nor responding to painful stimuli or moving extremities to command but at times in LE's spontaneous movements seen.  Was functioning at home with wife PTA.  He will benefit from skilled PT in the acute setting and will likely need follow up SNF level rehab at d/c.    Follow Up Recommendations SNF    Equipment Recommendations  None recommended by PT    Recommendations for Other Services       Precautions / Restrictions Precautions Precautions: Fall Precaution Comments: no longer has  defibrillator Required Braces or Orthoses: (no brace needed)      Mobility  Bed Mobility Overal bed mobility: Needs Assistance             General bed mobility comments: total A +2  Transfers                 General transfer comment: unable to attempt due to level of arousal  Ambulation/Gait                Stairs            Wheelchair Mobility    Modified Rankin (Stroke Patients Only)       Balance Overall balance assessment: Needs assistance   Sitting balance-Leahy Scale: Poor Sitting balance - Comments: appears pt trying to right self in upright position sitting in bed                                     Pertinent Vitals/Pain Pain Assessment: Faces Faces Pain Scale: Hurts a little bit Pain Location: general discomfort Pain Descriptors / Indicators: Moaning Pain Intervention(s): Monitored during session;Limited activity within patient's tolerance    Home Living Family/patient expects to be discharged to:: Private residence Living Arrangements: Spouse/significant other Available Help at Discharge: Family;Available 24 hours/day Type of Home: Apartment Home Access: Level entry     Home Layout: One level Home Equipment: Cane - single point;Walker - 2 wheels;Toilet riser;Shower seat;Walker - 4 wheels Additional Comments: OT called wife to confirm information    Prior Function Level of Independence: Independent with assistive device(s)         Comments:  uses rollator     Hand Dominance   Dominant Hand: Right    Extremity/Trunk Assessment   Upper Extremity Assessment Upper Extremity Assessment: Defer to OT evaluation    Lower Extremity Assessment Lower Extremity Assessment: LLE deficits/detail;RLE deficits/detail RLE Deficits / Details: PROM WFL, edema in feet, pt not moving to command or even to pain, but some spontaneous movement noted LLE Deficits / Details: PROM WFL, edema in feet, pt not moving to command  or even to pain, but some spontaneous movement noted    Cervical / Trunk Assessment Cervical / Trunk Assessment: Other exceptions(hx of back surgery)  Communication   Communication: Other (comment)(only moaning this session)  Cognition Arousal/Alertness: Lethargic Behavior During Therapy: Flat affect Overall Cognitive Status: Impaired/Different from baseline                                 General Comments: not following commands; eyes closed throughout session; not resonding to pain; moaning throughtou; opened eyes briefly at beginning of session only; wife states he has "a little dementia" She has to help him with his medication; "forgetful"      General Comments General comments (skin integrity, edema, etc.): Per wife enjoys action movies, baseball and smooth jazz, has been married 5 years; RR max 45, SpO2 97% on 3L O2, HR max 125, RN aware    Exercises General Exercises - Upper Extremity Shoulder Flexion: Both;5 reps;Seated;PROM Shoulder ABduction: PROM;Both;5 reps;Seated Elbow Flexion: PROM;Both;5 reps;Seated Elbow Extension: PROM;Both;5 reps;Seated Digit Composite Flexion: PROM;Both;5 reps;Seated Composite Extension: PROM;Both;5 reps;Seated   Assessment/Plan    PT Assessment Patient needs continued PT services  PT Problem List Decreased strength;Decreased balance;Decreased knowledge of use of DME;Decreased knowledge of precautions;Decreased mobility;Pain;Decreased cognition       PT Treatment Interventions DME instruction;Therapeutic activities;Balance training;Therapeutic exercise;Functional mobility training;Patient/family education;Wheelchair mobility training    PT Goals (Current goals can be found in the Care Plan section)  Acute Rehab PT Goals Patient Stated Goal: per wife for husband to get better PT Goal Formulation: With family Time For Goal Achievement: 06-06-20 Potential to Achieve Goals: Fair    Frequency Min 3X/week   Barriers to  discharge        Co-evaluation PT/OT/SLP Co-Evaluation/Treatment: Yes Reason for Co-Treatment: Complexity of the patient's impairments (multi-system involvement);Necessary to address cognition/behavior during functional activity;For patient/therapist safety PT goals addressed during session: Mobility/safety with mobility OT goals addressed during session: ADL's and self-care       AM-PAC PT "6 Clicks" Mobility  Outcome Measure Help needed turning from your back to your side while in a flat bed without using bedrails?: Total Help needed moving from lying on your back to sitting on the side of a flat bed without using bedrails?: Total Help needed moving to and from a bed to a chair (including a wheelchair)?: Total Help needed standing up from a chair using your arms (e.g., wheelchair or bedside chair)?: Total Help needed to walk in hospital room?: Total Help needed climbing 3-5 steps with a railing? : Total 6 Click Score: 6    End of Session Equipment Utilized During Treatment: Oxygen Activity Tolerance: Patient limited by lethargy Patient left: in bed;with call bell/phone within reach;with bed alarm set(chair position) Nurse Communication: Mobility status PT Visit Diagnosis: Other abnormalities of gait and mobility (R26.89);Muscle weakness (generalized) (M62.81);Other symptoms and signs involving the nervous system [id number])    Time: [id number] PT Time Calculation (min) (ACUTE ONLY): 18 min  Charges:   PT Evaluation $PT Eval High Complexity: 1 High          Magda Kiel, [place] Acute [facility] [phone] 05/19/2020   Reginia Naas 05/19/2020, 5:36 PM

## 2020-05-19 NOTE — Progress Notes (Signed)
  Amiodarone Drug - Drug Interaction Consult Note  Recommendations: Consider checking phenytoin levels if amiodarone continues  Amiodarone is metabolized by the cytochrome P450 system and therefore has the potential to cause many drug interactions. Amiodarone has an average plasma half-life of 50 days (range 20 to 100 days).   There is potential for drug interactions to occur several weeks or months after stopping treatment and the onset of drug interactions may be slow after initiating amiodarone.     Antiepileptics: Amiodarone can increase plasma concentration of phenytoin, the dose should be reduced. Note that small changes in phenytoin dose can result in large changes in levels. Monitor patient and counsel on signs of toxicity.   Beta blockers: increased risk of bradycardia, AV block and myocardial depression. Sotalol - avoid concomitant use.   Diuretics: increased risk of cardiotoxicity if hypokalemia  occurs.  Thank You,  Sherlon Handing, PharmD, BCPS Please see amion for complete clinical pharmacist phone list 05/19/2020 5:53 AM

## 2020-05-19 NOTE — Progress Notes (Signed)
NAME:  Louis Gullett., MRN:  [id number], DOB:  30-Nov-1946, LOS: 6 ADMISSION DATE:  05/12/2020, CONSULTATIO care unit N DATE:  05/19/20 REFERRING MD:  Neurosurgery, CHIEF COMPLAINT:   Fall  Brief History   74 year old male who presented to the emergency department 5/21 after a fall while walking to the bathroom was found to have pathologic compression fracture of T6 and large volume spinal epidural hemorrhage requiring hematoma evacuation and thoracic laminectomy.  He was intubated for procedure and transferred to the intensive care unit postop.  History of present illness   74 year old male with a history of hypertension, hyperlipidemia, CAD s/p CABG 2011, ischemic cardiomyopathy HFrEF, AICD, atrial fibrillation, CVA, seizure disorder, factor VII deficiency, not clearly diagnosed bleeding diathesis despite extensive work-up who was walking to the bathroom with his walker when he lost his balance and fell to seated position after his legs just gave out on him.  He was having significant pain on presentation to the ED and was found to have pathologic compression fracture of T6  large volume of Spinal Epidural Hemorrhage spanning from the cervical spine throughout the thoracic spine to the conus.  Widespread spinal cord mass-effect.  Neurosurgery and hematology evaluated, recommendation for 3 units FFP, 1 unit of platelets and 1 g of TXA  preprocedure, 1 unit platelets, 1 g TXA post procedure with additional FFP depending on volume status.  Neurosurgery performed T6 laminectomy and epidural clot evacuation.  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   Significant Hospital Events   5/21 admit to neurosurgery 5/22 to ICU postop 5/24 - OR for T4-T8 arthrodesis. 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   Micro Data:  5/21 MRSA screen>> negative 5/21 SARS-Cov-2>> negative 5/21 urine culture>>   Antimicrobials:  Ceftriaxone 5/21-  Interim history/subjective:  Remains encephalopathic following surgery.  Successfully extubated.  Now following some commands but remains lethargic.  Objective   Blood pressure (!) 126/91, pulse (!) 122, temperature 97.6 F (36.4 C), temperature source Axillary, resp. rate (!) 41, height 6' 2"  (1.88 m), weight 87 kg, SpO2 97 %.    Vent Mode: PSV;CPAP FiO2 (%):  [40 %] 40 % PEEP:  [5 cmH20] 5  cmH20 Pressure Support:  [12 cmH20] 12 cmH20  Plateau Pressure:  [24 cmH20] 24 cmH20   Intake/Output Summary (Last 24 hours) at 05/19/2020 0944 Last data filed at 05/19/2020 0900 Gross per 24 hour  Intake 2068.91 ml  Output 1915 ml  Net 153.91 ml   Filed Weights   05/14/20 1200 05/05/2020 1436  Weight: 87 kg 87 kg    General: Elderly male, agitated. In pain. HEENT: MM pink/moist,  Neuro: Pupils 2 mm equal and reactive to light,, 4/5 strength upper extremities.  Improving strength in lower extremities.  Still weaker on the right side. CV: s1s2 RRR, no m/r/g PULM: CTA B.  On SBT GI: soft, bsx4 active  Extremities: warm/dry, no edema  Skin: no rashes or lesions   Resolved Hospital Problem list     Assessment & Plan:   Toxic metabolic encephalopathy which is improving.  No seizures on EEG -Minimize sedative medications. -Allow time for hypoactive delirium to clear.  Was critically ill due to respiratory failure requiring mechanical ventilation. -Encourage pulmonary toilet -At high risk for reintubation until delirium clears.  Fall with pathologic T6 fracture and epidural hematoma causing cord compression Status post T6 laminectomy and spine stabilization 5/25 - Complete myeloma workup.  Bleeding diathesis, without definitive diagnosis, epidural tumor noted intraoperatively.  Received factor transfusions intraoperatively. -Extensive work-up by Dr. Irene Limbo and at [facility] by [name] -Work-up for possible multiple myeloma per hematology -pathology pending -Consider TXA infusion if requires further OR -Empirically transfuse FFP. -Consider tag to better define clotting disorder.  History of seizure disorder Sound like both tonic-clonic, followed by neurology  HFrEF, CAD s/p CABG, AICD, atrial fibrillation, hypertension, hyperlipidemia Last echocardiogram in 03/2020 with EF of 30 to 35%, not chronically anti-coagulated given bleeding disorder -Resume Coreg, amiodarone once  extubated. -Continue Lasix, monitor volume status and I&O's   Daily Goals Checklist  Pain/Anxiety/Delirium protocol (if indicated): stopped all except oxycodone Neuro vitals: every 2 hours AED's: Continue home AED's VAP protocol (if indicated): Not intubated. Respiratory support goals: Encourage deep breathing. Blood pressure target: Allow autoregulation. DVT prophylaxis: SCD's only due to bleeding diathesis.  Nutrition Status: SLP evaluation once more awake. GI prophylaxis: pantoprazole Fluid status goals: 500 bolus today given rising creatinine Urinary catheter: Condom Central lines: L CVC to remain in until swallowing.  Arterial line discontinued today. Glucose control: adequate control of T2DM with SSI. Mobility/therapy needs: Bedrest then PT once extubated.  Antibiotic de-escalation: Perioperative antibiotics only Home medication reconciliation:  Home AED, HF meds on hold Daily labs: CBC, BMP Code Status: Full  Family Communication: per Neurosurgery Disposition: ICU    Labs   CBC: Recent Labs  Lab 05/20/2020 1531 05/04/2020 1551 05/15/20 1717 05/15/20 1717 05/14/2020 0201 05/04/2020 0958 05/06/2020 1758 04/28/2020 2101 05/17/20 0333  WBC 3.5*   < > 7.8  --  8.0 7.9  --  6.1 6.1  NEUTROABS 2.6  --   --   --   --   --   --   --  5.5  HGB 12.6*   < > 8.5*   < > 8.4* 8.7* 9.2* 8.8* 8.8*  HCT 41.5   < > 28.2*   < > 27.8* 28.3* 27.0* 27.8* 27.0*  MCV 91.4   < > 92.2  --  93.0 93.1  --  91.7 89.4  PLT 191   < > 207  --  208 202  --  179 188   < > = values in this interval not displayed.    Basic Metabolic Panel: Recent Labs  Lab 05/20/2020 1531  05/03/2020 1531 05/04/2020 1551 05/01/2020 1551 05/14/20 0114 05/14/20 0159 05/14/20 0336 05/14/20 0654 04/26/2020 1758 05/17/20 0333  NA 135   < > 139   < > 141 140 141  --  145 140  K 4.0   < > 3.6   < > 3.9 3.8 3.6  --  4.4 4.3  CL 104  --  104  --   --   --   --   --   --  111  CO2 23  --   --   --   --   --   --   --   --  21*   GLUCOSE 129*  --  122*  --   --   --   --   --   --  166*  BUN 36*  --  41*  --   --   --   --   --   --  24*  CREATININE 1.51*  --  1.40*  --   --   --   --   --   --  1.52*  CALCIUM 8.4*  --   --   --   --   --   --   --   --  8.1*  MG  --   --   --   --   --   --   --  1.5*  --   --   PHOS  --   --   --   --   --   --   --  4.1  --   --    < > = values in this interval not displayed.   GFR: Estimated Creatinine Clearance: 51.8 mL/min (A) (by C-G formula based on SCr of 1.52 mg/dL (H)). Recent Labs  Lab 05/21/2020 0201 05/09/2020 0958 04/25/2020 2101 05/17/20 0333  WBC 8.0 7.9 6.1 6.1    Liver Function Tests: Recent Labs  Lab 05/22/2020 1531  AST 36  ALT 28  ALKPHOS 99  BILITOT 1.0  PROT 10.8*  ALBUMIN 2.9*   No results for input(s): LIPASE, AMYLASE in the last 168 hours. No results for input(s): AMMONIA in the last 168 hours.  ABG    Component Value Date/Time   PHART 7.334 (L) 04/29/2020 1758   PCO2ART 45.1 04/27/2020 1758   PO2ART 73 (L) 05/12/2020 1758   HCO3 24.0 05/19/2020 1758   TCO2 25 04/23/2020 1758   ACIDBASEDEF 2.0 05/20/2020 1758   O2SAT 93.0 05/06/2020 1758     Coagulation Profile: Recent Labs  Lab 05/15/2020 1531 05/14/20 0654 05/14/2020 2101  INR 1.4* 1.3* 1.4*    Cardiac Enzymes: No results for input(s): CKTOTAL, CKMB, CKMBINDEX, TROPONINI in the last 168 hours.  HbA1C: Hgb A1c MFr Bld  Date/Time Value Ref Range Status  05/14/2020 06:54 AM 6.1 (H) 4.8 - 5.6 % Final    Comment:    (NOTE) Pre diabetes:          5.7%-6.4% Diabetes:              >6.4% Glycemic control for   <7.0% adults with diabetes   07/25/2019 08:22 PM 5.2 4.8 - 5.6 % Final    Comment:    (NOTE) Pre diabetes:          5.7%-6.4% Diabetes:              >6.4% Glycemic control for   <7.0% adults with diabetes     CBG: Recent Labs  Lab 05/18/20 2007 05/18/20 2315 05/19/20 0314 05/19/20 0738 05/19/20 0745  GLUCAP [place], [place] ICU Physician [place]  Pager: [phone] Mobile: [phone] After hours: [phone].

## 2020-05-19 NOTE — Progress Notes (Signed)
Occupational Therapy Evaluation Patient Details Name: [facility]. MRN: [id number] DOB: 1946-12-24 Today's Date: 05/19/2020    History of Present Illness 74 year old male with a history of hypertension, hyperlipidemia, CAD s/p CABG 2011, ischemic cardiomyopathy HFrEF, AICD, atrial fibrillation, CVA, seizure disorder, factor VII deficiency, not clearly diagnosed bleeding diathesis despite extensive work-up who was walking to the bathroom with his walker when he lost his balance and fell to seated position after his legs just gave out on him.  He was having significant pain on presentation to the ED and was found to have pathologic compression fracture of T6  large volume of Spinal Epidural Hemorrhage spanning from the cervical spine throughout the thoracic spine to the conus.  Widespread spinal cord mass-effect. Neurosurgery performed T6 laminectomy and epidural clot evacuation.  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  He was transferred to the intensive care unit intubated and sedated. 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 Encephalopathic s/p surgery.    Clinical Impression   Spoke with wife over the phone. Pt was modified independent with mobility and self care using a rollator. Wife states pt has "mild dementia" and has to have assistance with medication management and no longer drives. Pt enjoys watching action moves and listening to smooth jazz music. Pt currently total A +2 with all mobility and total A with ADL due to level of arousal. Pt lethargic and not following any commands. Nsg reports seeing spontaneous movements all 4 extremities, L greater than R. At this time, recommend rehab at [facility]. Will follow acutely.     Follow Up Recommendations  SNF;Supervision/Assistance - 24 hour    Equipment Recommendations  3 in 1 bedside commode    Recommendations for  Other Services       Precautions / Restrictions Precautions Precautions: Back Precaution Comments: no longer has defibrillator Required Braces or Orthoses: (no brace needed)      Mobility Bed Mobility Overal bed mobility: Needs Assistance             General bed mobility comments: total A +2  Transfers                 General transfer comment: unable to attempt due to level of arousal    Balance Overall balance assessment: Needs assistance   Sitting balance-Leahy Scale: Poor Sitting balance - Comments: appears pt trying to right self in upright position sitting in bed                                   ADL either performed or assessed with clinical judgement   ADL Overall ADL's : Needs assistance/impaired                                       General ADL Comments: total A at this time     Vision   Additional Comments: Difficult to assess due to level of arousal     Perception     Praxis Praxis Praxis tested?: Deficits Deficits: Initiation    Pertinent Vitals/Pain Pain Assessment: Faces Faces Pain Scale: Hurts a little bit Pain Location: general discomfort Pain Descriptors / Indicators: Moaning Pain Intervention(s): Limited activity within patient's tolerance     Hand Dominance Right   Extremity/Trunk Assessment  Upper Extremity Assessment Upper Extremity Assessment: Difficult to assess due to impaired cognition(BUE; nmot moving on command; baseline tremors)   Lower Extremity Assessment Lower Extremity Assessment: Defer to PT evaluation   Cervical / Trunk Assessment Cervical / Trunk Assessment: Other exceptions(hx of back surgery)   Communication     Cognition Arousal/Alertness: Lethargic Behavior During Therapy: Flat affect Overall Cognitive Status: Impaired/Different from baseline                                 General Comments: not following commands; eyes closed throughout session; not  resonding to pain; moaning throughout; opened eyes briefly at beginning of session only; wife states he has "a little dementia" She has to help him with his medication; "forgetful"   General Comments  He enjoys watching action movies; will watch baseball; Loves smooth jazz; has been married 5 years    Exercises Exercises: Other exercises;General Upper Extremity General Exercises - Upper Extremity Shoulder Flexion: Both;5 reps;Seated;PROM Shoulder ABduction: PROM;Both;5 reps;Seated Elbow Flexion: PROM;Both;5 reps;Seated Elbow Extension: PROM;Both;5 reps;Seated Digit Composite Flexion: PROM;Both;5 reps;Seated Composite Extension: PROM;Both;5 reps;Seated   Shoulder Instructions      Home Living Family/patient expects to be discharged to:: Private residence Living Arrangements: Spouse/significant other Available Help at Discharge: Family;Available 24 hours/day Type of Home: Apartment Home Access: Level entry     Home Layout: One level     Bathroom Shower/Tub: Teacher, early years/pre: Standard Bathroom Accessibility: Yes How Accessible: Accessible via walker Home Equipment: Cane - single point;Walker - 2 wheels;Toilet riser;Shower seat;Walker - 4 wheels   Additional Comments: spoke with wife to confirm information      Prior Functioning/Environment Level of Independence: Independent with assistive device(s)(per PT note 01/16/20)        Comments: uses rollator(does not drive; )        OT Problem List: Decreased strength;Decreased range of motion;Decreased activity tolerance;Impaired balance (sitting and/or standing);Decreased coordination;Decreased cognition;Decreased safety awareness;Decreased knowledge of use of DME or AE;Decreased knowledge of precautions;Cardiopulmonary status limiting activity;Obesity;Pain      OT Treatment/Interventions: Self-care/ADL training;Therapeutic exercise;Neuromuscular education;DME and/or AE instruction;Therapeutic  activities;Cognitive remediation/compensation;Patient/family education;Balance training    OT Goals(Current goals can be found in the care plan section) Acute Rehab OT Goals Patient Stated Goal: per wife for husband to get better OT Goal Formulation: With family Time For Goal Achievement: June 15, 2020 Potential to Achieve Goals: Good  OT Frequency: Min 2X/week   Barriers to D/C:            Co-evaluation PT/OT/SLP Co-Evaluation/Treatment: Yes Reason for Co-Treatment: Complexity of the patient's impairments (multi-system involvement);For patient/therapist safety;Necessary to address cognition/behavior during functional activity   OT goals addressed during session: ADL's and self-care      AM-PAC OT "6 Clicks" Daily Activity     Outcome Measure Help from another person eating meals?: Total Help from another person taking care of personal grooming?: Total Help from another person toileting, which includes using toliet, bedpan, or urinal?: Total Help from another person bathing (including washing, rinsing, drying)?: Total Help from another person to put on and taking off regular upper body clothing?: Total Help from another person to put on and taking off regular lower body clothing?: Total 6 Click Score: 6   End of Session Equipment Utilized During Treatment: Oxygen(3L) Nurse Communication: Mobility status  Activity Tolerance: Patient limited by lethargy Patient left: in bed;with call bell/phone within reach;with bed alarm set;with SCD's reapplied(modified chair position)  OT Visit Diagnosis: Other abnormalities of gait and mobility (R26.89);Muscle weakness (generalized) (M62.81);Other symptoms and signs involving cognitive function;Pain Pain - part of body: (back)                Time: [id number] OT Time Calculation (min): 18 min Charges:  OT General Charges $OT Visit: 1 Visit OT Evaluation $OT Eval High Complexity: 1 High  [place], OT/L   Acute OT Clinical  Specialist Acute Rehabilitation Services Pager [phone] Office [phone]   [facility] 05/19/2020, 3:09 PM

## 2020-05-19 NOTE — Progress Notes (Addendum)
Patient ID: Louis Ford., male   DOB: 1946/09/09, 74 y.o.   MRN: [id number] BP 105/74   Pulse 78   Temp 98.6 F (37 C) (Axillary)   Resp (!) 33   Ht 6\' 2"  (1.88 m) Comment: Measured by RT  Wt 87 kg   SpO2 96%   BMI 24.63 kg/m  Alert, not following many commands Biopsy positive for myeloma Will await oncology recommendations.  Wound is clean, dry, no signs of infection Moving all extremities. Since patient will clearly need rehab, waiting for outpatient recommendations is not particularly useful for Louis Ford or Louis Ford. Transport to appointments will be difficult, so I would appreciate for the patient's sake,  that any recommendations be made while he is an inpatient. Now that a diagnosis of the malignancy is known her questions are reasonable, and should be addressed sooner than later.  Though this may not be usual protocol but will be  advantageous for the family to discuss this while he is here.  Louis Ford is curious about what future treatments will be. In order for this to be helpful for the patient an inpatient assessment of treatment options is most convenient for the patient and Louis Ford.

## 2020-05-19 NOTE — Progress Notes (Signed)
Patient in sustained AFIB between 130-140 bpm despite implanted defibrillator. Elink called regarding patient being placed on an amiodarone drip. Patient missed 2200 amiodarone PO due to unsuccessful NG tube insertion and being strict NPO due to aspiration risk following extubation. Elink RN will follow up with Dr. Lucile Shutters. Will continue to monitor patient and begin IV drips when available.

## 2020-05-19 NOTE — Progress Notes (Addendum)
HEMATOLOGY/ONCOLOGY NOTE  Date of Service: 05/19/2020  Patient Care Team: [name], MD as PCP - General (Internal Medicine) [name], MD as PCP - Electrophysiology (Cardiology) [name], [name], MD as PCP - Advanced Heart Failure (Cardiology) [name], MD as Consulting Physician (Neurology)  REFERRING PHYSICIAN: No ref. provider found  CHIEF COMPLAINTS/PURPOSE OF CONSULTATION:  Bleeding Disorder, Iron deficiency, Monoclonal Protein Spike  HISTORY OF PRESENTING ILLNESS:   [facility]. is a 74 y.o. male referred to [place] for a bleeding disorder, iron deficiency anemia, and monoclonal protein spike.  Last seen in our office on 12/29/2019.  Missed an appointment with [place] in early April 2021.  Seen by [name] at [facility] at the end of January 2021 for his bleeding disorder.  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  There was also felt to be a defect in platelet function as well as slight deficiency in alcohol to antiplasmin.  He was felt to be very high risk for bleeding for any procedures.  Recommendations were for 1 to 2 units of platelets as well as antifibrinolytic's and a small amount of plasma for procedures.  Admitted following a fall with epidural hematoma to the thoracic spine, T6 compression fracture and paraplegia of the lower extremities.  Went to the OR on 05/14/2020 for thoracic laminectomy for hematoma evacuation.  He also had an epidural tumor removed.  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  He received 3 units FFP and 2 units of platelets in the perioperative period.  Had a drop in his hemoglobin postoperatively but had minimal blood on the dressing and drain output was standard for this type of surgery.  Went back to the OR on 04/27/2020 and did not have any significant bleeding.  Kappa/lambda light chains have been checked with an elevated kappa, lambda light chain ratio of 3.10, M spike  3.0, UPEP pending.  Today he is lethargic, follows commands, no bleeding noted.  MEDICAL HISTORY:  Past Medical History:  Diagnosis Date  . AICD (automatic cardioverter/defibrillator) present   . Atrial fibrillation   09/22/2012  . Blood loss anemia 04/18/2017   After GI bleed from colonoscopy and polypectomy  . CAD (coronary artery disease)   . Chronic systolic heart failure ([place])   . 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 GIB (gastrointestinal bleeding) 02/20/2017  . History of MRSA infection 05/2011  . HTN (hypertension)   . Hx of adenomatous colonic polyps 02/20/2017   01/2017 - 3 cm sigmoid TV adenoma and other smaller polyps - had post-polypectomy bleed Tx w/ clips Consider repeat colonoscopy 3 yrs [name], MD, [name]   . Hyperlipidemia   . implantable cardiac defibrillator-Biotronik    Device Implanted 2006; s/p gen change 03/2011 : bleeding persistent with pocket erosion and infection; explant and reimplant  06/2011  . Ischemic cardiomyopathy    EF 15 to 20% by TTE and TEE in 09/2012.  Severe LV dysfunction  . Obesity    BMI 31 in 09/2012  . Persistent atrial fibrillation with rapid ventricular response ([place]) 04/21/2014  . Retroperitoneal bleed 04/21/2019  . Seizure disorder ([place]) latest 09/30/2012  . Seizures ([place])   . Stroke [facility])      SURGICAL HISTORY: Past Surgical History:  Procedure Laterality Date  . ALVEOLOPLASTY N/A 07/26/2019   Procedure: ALVEOLOPLASTY WITH RESUTURING OF ORAL WOUND;  Surgeon: [name], [name];  Location: [facility] ORS;  Service: Oral Surgery;  Laterality: N/A;  . CARDIAC CATHETERIZATION N/A 10/12/2015   Procedure: Right Heart Cath;  Surgeon: [name], MD;  Location: [place] CV LAB;  Service: Cardiovascular;  Laterality: N/A;  . CARDIOVERSION  09/24/2012   Procedure: CARDIOVERSION;  Surgeon: [name], MD;  Location: [facility] ENDOSCOPY;  Service: Cardiovascular;  Laterality: N/A;  .  CARDIOVERSION N/A 12/25/2017   Procedure: CARDIOVERSION;  Surgeon: [name], MD;  Location: [facility];  Service: Cardiovascular;  Laterality: N/A;  . COLONOSCOPY W/ POLYPECTOMY  02/13/2017  . CORONARY ARTERY BYPASS GRAFT  2011   in [place] N/A 08/12/2019   Procedure: CAUTERIZATION OF ORAL BLEEDING;  Surgeon: [name], [name];  Location: [facility];  Service: Oral Surgery;  Laterality: N/A;  . FLEXIBLE SIGMOIDOSCOPY N/A 02/20/2017   Procedure: FLEXIBLE SIGMOIDOSCOPY;  Surgeon: [name], MD;  Location: [facility] ENDOSCOPY;  Service: Endoscopy;  Laterality: N/A;  . FLEXIBLE SIGMOIDOSCOPY N/A 02/21/2017   Procedure: FLEXIBLE SIGMOIDOSCOPY;  Surgeon: [name], MD;  Location: [facility] ENDOSCOPY;  Service: Endoscopy;  Laterality: N/A;  . ICD    . ICD GENERATOR CHANGEOUT N/A 09/10/2019   Procedure: [place];  Surgeon: [name], MD;  Location: [place] CV LAB;  Service: Cardiovascular;  Laterality: N/A;  . ICD LEAD REMOVAL Right 11/13/2019   Procedure: ICD EXTRACTION and Lead Extraction;  Surgeon: [name], MD;  Location: [place];  Service: Cardiovascular;  Laterality: Right;  Dr. Servando Snare for backup  . LAMINECTOMY WITH POSTERIOR LATERAL ARTHRODESIS LEVEL 4 N/A 05/15/2020   Procedure: THORACIC FOUR-THORACIC EIGHT POSTERIOR LATERAL ARTHRODESIS;  Surgeon: [name], MD;  Location: [place];  Service: Neurosurgery;  Laterality: N/A;  . POCKET REVISION/RELOCATION N/A 09/17/2019   Procedure: POCKET REVISION/RELOCATION;  Surgeon: Thompson Grayer, MD;  Location: [place] CV LAB;  Service: Cardiovascular;  Laterality: N/A;  . SUBMANDIBULAR GLAND EXCISION N/A 08/08/2019   Procedure: suture of oral wounds;  Surgeon: [name], [name];  Location: [facility];  Service: Oral Surgery;  Laterality: N/A;  . TEE WITHOUT CARDIOVERSION  09/24/2012   Procedure: TRANSESOPHAGEAL ECHOCARDIOGRAM (TEE);  Surgeon: [name], MD;  Location: [place];  Service: Cardiovascular;  Laterality:  N/A;  dave/anesth, dl, cindy/echo   . TEE WITHOUT CARDIOVERSION N/A 11/12/2019   Procedure: TRANSESOPHAGEAL ECHOCARDIOGRAM (TEE);  Surgeon: [name], MD;  Location: [facility] ENDOSCOPY;  Service: Cardiovascular;  Laterality: N/A;  . TEE WITHOUT CARDIOVERSION N/A 11/13/2019   Procedure: TRANSESOPHAGEAL ECHOCARDIOGRAM (TEE);  Surgeon: [name], MD;  Location: [place];  Service: Cardiovascular;  Laterality: N/A;  . THORACIC LAMINECTOMY FOR EPIDURAL ABSCESS N/A 05/12/2020   Procedure: THORACIC LAMINECTOMY FOR HEMATOMA EVACUATION;  Surgeon: [name], MD;  Location: [place];  Service: Neurosurgery;  Laterality: N/A;     SOCIAL HISTORY: Social History   Socioeconomic History  . Marital status: Married    Spouse name: Not on file  . Number of children: Not on file  . Years of education: Not on file  . Highest education level: Not on file  Occupational History  . Not on file  Tobacco Use  . Smoking status: Former Smoker    Types: Pipe    Quit date: 09/21/2008    Years since quitting: 11.6  . Smokeless tobacco: Former Systems developer    Quit date: 09/21/2008  Substance and Sexual Activity  . Alcohol use: No  . Drug use: No  . Sexual activity: Not Currently  Birth control/protection: None  Other Topics Concern  . Not on file  Social History Narrative   Right handed   Apartment ground floor   Drinks caffeine       Social Determinants of Health   Financial Resource Strain:   . Difficulty of Paying Living Expenses:   Food Insecurity:   . Worried About Charity fundraiser in the Last Year:   . Arboriculturist in the Last Year:   Transportation Needs:   . Film/video editor (Medical):   [name] Lack of Transportation (Non-Medical):   Physical Activity:   . Days of Exercise per Week:   . Minutes of Exercise per Session:   Stress:   . Feeling of Stress :   Social Connections:   . Frequency of Communication with Friends and Family:   . Frequency of Social Gatherings with Friends  and Family:   . Attends Religious Services:   . Active Member of Clubs or Organizations:   . Attends Archivist Meetings:   [name] Marital Status:   Intimate Partner Violence:   . Fear of Current or Ex-Partner:   . Emotionally Abused:   [name] Physically Abused:   . Sexually Abused:      FAMILY HISTORY: Family History  Problem Relation Age of Onset  . Arthritis Mother   . Heart disease Mother   . Heart attack Mother   . Other Father        smoker  . Hypertension Neg Hx        unknown  . Stroke Neg Hx        unknown     ALLERGIES:   has No Known Allergies.   MEDICATIONS:  Current Facility-Administered Medications  Medication Dose Route Frequency Provider Last Rate Last Admin  . 0.9 %  sodium chloride infusion (Manually program via Guardrails IV Fluids)   Intravenous Once [name], MD      . 0.9 %  sodium chloride infusion (Manually program via Guardrails IV Fluids)   Intravenous Once [name], MD      . 0.9 %  sodium chloride infusion (Manually program via Guardrails IV Fluids)   Intravenous Once [name], MD      . 0.9 %  sodium chloride infusion  10 mL/hr Intravenous Once [name], MD      . 0.9 %  sodium chloride infusion  250 mL Intravenous Continuous Cecilie Lowers T, MD      . acetaminophen (OFIRMEV) IV 1,000 mg  1,000 mg Intravenous Q6H PRN Ogan, Okoronkwo U, MD      . chlorhexidine gluconate (MEDLINE KIT) (PERIDEX) 0.12 % solution 15 mL  15 mL Mouth Rinse BID [name], MD   15 mL at 05/19/20 8937  . Chlorhexidine Gluconate Cloth 2 % PADS 6 each  6 each Topical Daily [name], MD   6 each at 05/18/20 2205  . dextrose 5 % and 0.9 % NaCl with KCl 20 mEq/L infusion   Intravenous Continuous Agarwala, Einar Grad, MD 10 mL/hr at 05/19/20 1400 Rate Verify at 05/19/20 1400  . furosemide (LASIX) injection 20 mg  20 mg Intravenous Daily [name], MD   20 mg at 05/19/20 0944  . insulin aspart (novoLOG) injection 0-9 Units  0-9 Units Subcutaneous Q4H  [name], MD   1 Units at 05/18/20 0006  . levETIRAcetam (KEPPRA) IVPB 1000 mg/100 mL premix  1,000 mg Intravenous Q12H Meyran, Ocie Cornfield, NP   Stopped at 05/19/20 0935  .  LORazepam (ATIVAN) injection 2 mg  2 mg Intravenous Once [name], MD      . MEDLINE mouth rinse  15 mL Mouth Rinse BID [name], MD   15 mL at 05/19/20 0917  . menthol-cetylpyridinium (CEPACOL) lozenge 3 mg  1 lozenge Oral PRN [name], MD       Or  . phenol (CHLORASEPTIC) mouth spray 1 spray  1 spray Mouth/Throat PRN [name], MD      . metoprolol tartrate (LOPRESSOR) injection 5 mg  5 mg Intravenous Q6H Agarwala, Einar Grad, MD   5 mg at 05/19/20 1451  . norepinephrine (LEVOPHED) 90m in 2532mpremix infusion  2-10 mcg/min Intravenous Titrated MoElmer SowMD   Stopped at 05/18/20 [id number]. ondansetron (ZOFRAN) tablet 4 mg  4 mg Per Tube Q6H PRN CaAshok PallMD       Or  . ondansetron (ZOFRAN) injection 4 mg  4 mg Intravenous Q6H PRN CaAshok PallMD      . oxyCODONE (Oxy IR/ROXICODONE) immediate release tablet 10 mg  10 mg Per Tube Q3H PRN CaAshok PallMD   10 mg at 05/18/20 0418  . oxyCODONE (Oxy IR/ROXICODONE) immediate release tablet 5 mg  5 mg Per Tube Q3H PRN CaAshok PallMD      . pantoprazole (PROTONIX) injection 40 mg  40 mg Intravenous Daily CaAshok PallMD   40 mg at 05/19/20 0944  . phenytoin (DILANTIN) injection 100 mg  100 mg Intravenous BID MeEleonore ChiquitoNP   100 mg at 05/19/20 0921  . polyethylene glycol (MIRALAX / GLYCOLAX) packet 17 g  17 g Per Tube Daily AgKipp BroodMD   17 g at 05/18/20 0905  . senna-docusate (Senokot-S) tablet 1 tablet  1 tablet Per Tube BID AgKipp BroodMD   1 tablet at 05/18/20 0905  . simvastatin (ZOCOR) tablet 20 mg  20 mg Per Tube Daily CaAshok PallMD   20 mg at 05/18/20 0905  . sodium chloride flush (NS) 0.9 % injection 10-40 mL  10-40 mL Intracatheter Q12H CaAshok PallMD   10 mL at 05/19/20 0923  . valproate (DEPACON) 125  mg in dextrose 5 % 50 mL IVPB  125 mg Intravenous QID Meyran, KiOcie CornfieldNP   Stopped at 05/19/20 1450     REVIEW OF SYSTEMS:   Unable to obtain a comprehensive review of systems secondary to patient sedation.   PHYSICAL EXAMINATION: ECOG PERFORMANCE STATUS: 4 - Bedbound  Vitals:   05/19/20 1300 05/19/20 1400  BP: 104/75 105/74  Pulse: (!) 113 78  Resp: (!) 28 (!) 33  Temp:    SpO2: 96% 96%   Filed Weights   05/14/20 1200 05/18/2020 1436  Weight: 87 kg 87 kg   Body mass index is 24.63 kg/m.  GENERAL:alert, in no acute distress and comfortable LUNGS: clear to auscultation b/l with normal respiratory effort HEART: Regular rate and rhythm ABDOMEN:  normoactive bowel sounds , non tender, not distended. Extremity: no pedal edema  LABORATORY DATA:  I have reviewed the data as listed  CBC Latest Ref Rng & Units 05/17/2020 04/26/2020 04/29/2020  WBC 4.0 - 10.5 K/uL 6.1 6.1 -  Hemoglobin 13.0 - 17.0 g/dL 8.8(L) 8.8(L) 9.2(L)  Hematocrit 39.0 - 52.0 % 27.0(L) 27.8(L) 27.0(L)  Platelets 150 - 400 K/uL 188 179 -    CMP Latest Ref Rng & Units 05/17/2020 05/12/2020 05/14/2020  Glucose 70 - 99 mg/dL 166(H) - -  BUN 8 - 23 mg/dL  24(H) - -  Creatinine 0.61 - 1.24 mg/dL 1.52(H) - -  Sodium 135 - 145 mmol/L 140 145 141  Potassium 3.5 - 5.1 mmol/L 4.3 4.4 3.6  Chloride 98 - 111 mmol/L 111 - -  CO2 22 - 32 mmol/L 21(L) - -  Calcium 8.9 - 10.3 mg/dL 8.1(L) - -  Total Protein 6.5 - 8.1 g/dL - - -  Total Bilirubin 0.3 - 1.2 mg/dL - - -  Alkaline Phos 38 - 126 U/L - - -  AST 15 - 41 U/L - - -  ALT 0 - 44 U/L - - -   Component     Latest Ref Rng & Units 04/17/2017 11/27/2017 07/22/2019 07/29/2019  IgG (Immunoglobin G), Serum     603 - 1,613 mg/dL    2,155 (H)  IgA     61 - 437 mg/dL    320  IgM (Immunoglobulin M), Srm     20 - 172 mg/dL    10 (L)  Total Protein ELP     6.0 - 8.5 g/dL    6.0  Albumin SerPl Elph-Mcnc     2.9 - 4.4 g/dL    2.5 (L)  Alpha 1     0.0 - 0.4 g/dL    0.2    Alpha2 Glob SerPl Elph-Mcnc     0.4 - 1.0 g/dL    0.5  B-Globulin SerPl Elph-Mcnc     0.7 - 1.3 g/dL    0.5 (L)  Gamma Glob SerPl Elph-Mcnc     0.4 - 1.8 g/dL    2.2 (H)  M Protein SerPl Elph-Mcnc     Not Observed g/dL    1.6 (H)  Globulin, Total     2.2 - 3.9 g/dL    3.5  Albumin/Glob SerPl     0.7 - 1.7    0.8  IFE 1         Comment  Please Note (HCV):         Comment  Anticardiolipin Ab,IgA,Qn     0 - 11 APL U/mL      Anticardiolipin Ab,IgG,Qn     0 - 14 GPL U/mL      Anticardiolipin Ab,IgM,Qn     0 - 12 MPL U/mL      PTT Lupus Anticoagulant     0.0 - 51.9 sec      DRVVT     0.0 - 47.0 sec      Phosphatydalserine, IgG     0 - 11 GPS IgG      Phosphatydalserine, IgM     0 - 25 MPS IgM      Phosphatydalserine, IgA     0 - 20 APS IgA      Lupus Anticoag Interp           APTT     22.9 - 30.2 sec      aPTT 1:1 Normal Plasma     22.9 - 30.2 sec      aPTT 1:1 Mix Saline           aPTT 1:1 NP Mix, 60 Min,Incub.     22.9 - 30.2 sec      aPTT 1:1 NP Incub. Mix Ctl     22.9 - 30.2 sec      Coagulation Factor VIII     56 - 140 %    182 (H)  Ristocetin Co-factor, Plasma     50 - 200 %    91  Von Willebrand Antigen, Plasma     50 - 200 %    117  PT     9.6 - 11.5 sec      PT 1:1NP     9.6 - 11.5 sec      1 HR INCUB PT 1:1NP     9.6 - 11.5 sec      PFA Interpretation           Collagen / Epinephrine     0 - 193 seconds    132  Ferritin     24 - 336 ng/mL 13.0 (L)   28  Vitamin B12     211 - 911 pg/mL  321    Factor VII Activity     51 - 186 %   69   Factor XIII, Qualitative     No Lysis/24    NORMAL  Von Willebrand Multimers         Comment  Factor X Activity     76 - 183 %      Factor V Activity     70 - 150 %      Factor II Activity     50 - 154 %      THROMBIN TIME     0.0 - 23.0 sec      Interpretation         Note  Sed Rate     0 - 16 mm/hr       Component     Latest Ref Rng & Units 07/31/2019 09/24/2019  IgG (Immunoglobin G), Serum      603 - 1,613 mg/dL    IgA     61 - 437 mg/dL    IgM (Immunoglobulin M), Srm     20 - 172 mg/dL    Total Protein ELP     6.0 - 8.5 g/dL    Albumin SerPl Elph-Mcnc     2.9 - 4.4 g/dL    Alpha 1     0.0 - 0.4 g/dL    Alpha2 Glob SerPl Elph-Mcnc     0.4 - 1.0 g/dL    B-Globulin SerPl Elph-Mcnc     0.7 - 1.3 g/dL    Gamma Glob SerPl Elph-Mcnc     0.4 - 1.8 g/dL    M Protein SerPl Elph-Mcnc     Not Observed g/dL    Globulin, Total     2.2 - 3.9 g/dL    Albumin/Glob SerPl     0.7 - 1.7    IFE 1         Please Note (HCV):         Anticardiolipin Ab,IgA,Qn     0 - 11 APL U/mL <9   Anticardiolipin Ab,IgG,Qn     0 - 14 GPL U/mL <9   Anticardiolipin Ab,IgM,Qn     0 - 12 MPL U/mL <9   PTT Lupus Anticoagulant     0.0 - 51.9 sec 64.2 (H)   DRVVT     0.0 - 47.0 sec 41.0   Phosphatydalserine, IgG     0 - 11 GPS IgG 3   Phosphatydalserine, IgM     0 - 25 MPS IgM 2   Phosphatydalserine, IgA     0 - 20 APS IgA 2   Lupus Anticoag Interp      Comment:   APTT     22.9 - 30.2 sec 35.8 (H)   aPTT 1:1 Normal Plasma     22.9 -  30.2 sec 32.4 (H)   aPTT 1:1 Mix Saline      NOT PERFORMED   aPTT 1:1 NP Mix, 60 Min,Incub.     22.9 - 30.2 sec 35.0 (H)   aPTT 1:1 NP Incub. Mix Ctl     22.9 - 30.2 sec 33.0 (H)   Coagulation Factor VIII     56 - 140 %    Ristocetin Co-factor, Plasma     50 - 200 %    Von Willebrand Antigen, Plasma     50 - 200 %    PT     9.6 - 11.5 sec 12.5 (H)   PT 1:1NP     9.6 - 11.5 sec 10.9   1 HR INCUB PT 1:1NP     9.6 - 11.5 sec 11.4   PFA Interpretation         Collagen / Epinephrine     0 - 193 seconds    Ferritin     24 - 336 ng/mL  122  Vitamin B12     211 - 911 pg/mL    Factor VII Activity     51 - 186 %    Factor XIII, Qualitative     No Lysis/24    Von Willebrand Multimers         Factor X Activity     76 - 183 % 95   Factor V Activity     70 - 150 % 64 (L)   Factor II Activity     50 - 154 % 82   THROMBIN TIME     0.0 - 23.0 sec 17.8    Interpretation         Sed Rate     0 - 16 mm/hr  16    RADIOGRAPHIC STUDIES: I have personally reviewed the radiological images as listed and agreed with the findings in the report. CT HEAD WO CONTRAST  Result Date: 05/09/2020 CLINICAL DATA:  Fall, back pain, history of retroperitoneal bleeding. EXAM: CT HEAD WITHOUT CONTRAST CT CERVICAL SPINE WITHOUT CONTRAST TECHNIQUE: Multidetector CT imaging of the head and cervical spine was performed following the standard protocol without intravenous contrast. Multiplanar CT image reconstructions of the cervical spine were also generated. COMPARISON:  Prior head CT examinations 07/20/2019 and earlier, chest CT 10/29/2019. 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 Stable ill-defined hypoattenuation within the cerebral white matter which is nonspecific, but consistent with chronic small vessel ischemic disease. Stable, mild generalized parenchymal atrophy. There is no acute intracranial hemorrhage. No acute demarcated cortical infarct. No extra-axial fluid collection. No evidence of intracranial mass. No midline shift. Vascular: No hyperdense vessel.  Atherosclerotic calcifications. Skull: Normal. Negative for fracture or focal lesion. Sinuses/Orbits: Visualized orbits show no acute finding. Mild ethmoid sinus mucosal thickening. No significant mastoid effusion. CT CERVICAL SPINE FINDINGS Alignment: Straightening of the expected cervical lordosis. Minimal C7-T1 anterolisthesis. Skull base and vertebrae: The basion-dental and atlanto-dental intervals are maintained.No evidence of acute fracture to the cervical spine. Soft tissues and spinal canal: No prevertebral fluid or swelling. No visible canal hematoma. Disc levels: Cervical  spondylosis with multilevel disc space narrowing, posterior disc osteophytes, uncovertebral and facet hypertrophy. Disc space narrowing is advanced at C3-C4 and C4-C5. Degenerative fusion of the C5-C6 vertebral bodies. Upper chest: Reported separately. No consolidation within the  imaged lung apices. No visible pneumothorax. Prior median sternotomy. Indeterminate 9 mm well-circumscribed lucent focus within the posterior T1 vertebral body. Incompletely imaged irregular lucency within the posterior left second rib. IMPRESSION: CT head: 1. No evidence of acute intracranial abnormality. 2. 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 3. Stable mild generalized parenchymal atrophy and chronic small vessel ischemic disease. 4. Mild ethmoid sinus mucosal thickening. Cervical spine: 1. No evidence of acute fracture to the cervical spine. 2. Indeterminate 9 mm well-circumscribed lucent lesion within the posterior T1 vertebral body. Additionally, there is incompletely imaged irregular lucency within the posterior left second rib. Please refer to separately reported CT chest. 3. Cervical spondylosis as described. Degenerative C5-C6 fusion. 4. Minimal C7-T1 grade 1 anterolisthesis. Electronically Signed   By: Kellie Simmering DO   On: 05/14/2020 18:43   CT Chest W Contrast  Result Date: 05/23/2020 CLINICAL DATA:  Status post fall. EXAM: CT CHEST, ABDOMEN, AND PELVIS WITH CONTRAST TECHNIQUE: Multidetector CT imaging of the chest, abdomen and pelvis was performed following the standard protocol during bolus administration of intravenous contrast. CONTRAST:  199m OMNIPAQUE IOHEXOL 300 MG/ML  SOLN COMPARISON:  None. FINDINGS: CT CHEST FINDINGS Cardiovascular: There is moderate severity calcification of the aortic arch. There is moderate severity cardiomegaly. No pericardial effusion. Mediastinum/Nodes: No enlarged mediastinal, hilar, or axillary lymph nodes.  Thyroid gland, trachea, and esophagus demonstrate no significant findings. Lungs/Pleura: A 3.7 cm x 1.5 cm low-attenuation mass is seen along the posterior aspect of the left apex. An adjacent expansile lytic area is seen within the posterior aspect of the second left rib (axial CT image 15, CT series number 3). Moderate severity atelectasis and/or infiltrate is seen within the left lower lobe. There is no evidence of a pleural effusion or pneumothorax. Musculoskeletal: Multiple sternal wires are seen. A 4.3 cm x 2.2 cm intramuscular lipoma is seen along the anterior left chest wall. Degenerative changes are seen throughout the thoracic spine. CT ABDOMEN PELVIS FINDINGS Hepatobiliary: No focal liver abnormality is seen. No gallstones, gallbladder wall thickening, or biliary dilatation. Pancreas: Unremarkable. No pancreatic ductal dilatation or surrounding inflammatory changes. Spleen: Normal in size without focal abnormality. Adrenals/Urinary Tract: Adrenal glands are unremarkable. Innumerable cysts of various sizes are seen within both kidneys. There is no evidence of renal calculi or hydronephrosis. Bladder is unremarkable. Stomach/Bowel: Stomach is within normal limits. Appendix appears normal. No evidence of bowel wall thickening, distention, or inflammatory changes. Vascular/Lymphatic: There is moderate severity calcification and atherosclerosis throughout the abdominal aorta. No enlarged abdominal or pelvic lymph nodes. Reproductive: The prostate gland is mildly enlarged. Other: No abdominal wall hernia or abnormality. No abdominopelvic ascites. Musculoskeletal: Multilevel degenerative changes are seen throughout the lumbar spine. IMPRESSION: 1. 3.7 cm x 1.5 cm low-attenuation mass along the posterior aspect of the left apex with an adjacent expansile lytic area seen within the posterior aspect of the second left rib. This may represent an area of fibrous dysplasia, however, a neoplastic process cannot be  excluded. 2. Moderate severity atelectasis and/or infiltrate within the left lower lobe. 3. Innumerable cysts of various sizes within both kidneys consistent with polycystic kidney disease. 4. 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 5. Mild enlargement of the prostate gland. 6. Aortic atherosclerosis. Aortic Atherosclerosis (ICD10-I70.0). Electronically Signed   By: TVirgina NorfolkM.D.   On: 04/23/2020 18:34   CT CERVICAL SPINE WO CONTRAST  Result Date: 05/15/2020 CLINICAL DATA:  Fall, back pain, history of retroperitoneal bleeding. EXAM: CT HEAD WITHOUT CONTRAST CT CERVICAL SPINE WITHOUT CONTRAST TECHNIQUE: Multidetector CT imaging of the head and cervical spine was performed following the standard protocol without intravenous contrast. Multiplanar CT image reconstructions of the cervical spine were also generated. COMPARISON:  Prior head CT examinations 07/20/2019 and earlier, chest CT 10/29/2019. 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 Stable ill-defined hypoattenuation within the cerebral white matter which is nonspecific, but consistent with chronic small vessel ischemic disease. Stable, mild generalized parenchymal atrophy. There is no acute intracranial hemorrhage. No acute demarcated cortical infarct. No extra-axial fluid collection. No evidence of intracranial mass. No midline shift. Vascular: No hyperdense vessel.  Atherosclerotic calcifications. Skull: Normal. Negative for fracture or focal lesion. Sinuses/Orbits: Visualized orbits show no acute finding. Mild ethmoid sinus mucosal thickening. No significant mastoid effusion. CT CERVICAL SPINE FINDINGS Alignment: Straightening of the expected cervical lordosis. Minimal C7-T1  anterolisthesis. Skull base and vertebrae: The basion-dental and atlanto-dental intervals are maintained.No evidence of acute fracture to the cervical spine. Soft tissues and spinal canal: No prevertebral fluid or swelling. No visible canal hematoma. Disc levels: Cervical spondylosis with multilevel disc space narrowing, posterior disc osteophytes, uncovertebral and facet hypertrophy. Disc space narrowing is advanced at C3-C4 and C4-C5. Degenerative fusion of the C5-C6 vertebral bodies. Upper chest: Reported separately. No consolidation within the imaged lung apices. No visible pneumothorax. Prior median sternotomy. Indeterminate 9 mm well-circumscribed lucent focus within the posterior T1 vertebral body. Incompletely imaged irregular lucency within the posterior left second rib. IMPRESSION: CT head: 1. No evidence of acute intracranial abnormality. 2. 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 3. Stable mild generalized parenchymal atrophy and chronic small vessel ischemic disease. 4. Mild ethmoid sinus mucosal thickening. Cervical spine: 1. No evidence of acute fracture to the cervical spine. 2. Indeterminate 9 mm well-circumscribed lucent lesion within the posterior T1 vertebral body. Additionally, there is incompletely imaged irregular lucency within the posterior left second rib. Please refer to separately reported CT chest. 3. Cervical spondylosis as described. Degenerative C5-C6 fusion. 4. Minimal C7-T1 grade 1 anterolisthesis. Electronically Signed   By: Kellie Simmering DO   On: 04/24/2020 18:43   MR THORACIC SPINE W WO CONTRAST  Addendum Date: 05/22/2020   ADDENDUM REPORT: 05/15/2020 22:19 ADDENDUM: Critical Value/emergent results were called by telephone at the time of interpretation on 04/30/2020 at 2210 hours to Dr. Shirlyn Goltz , who verbally acknowledged these results. Furthermore, after reading the Lumbar MRI on this patient, it is  possible this large amount of intraspinal hematoma is in the subdural space in addition to (or rather than) the epidural space. Electronically Signed   By: Genevie Ann M.D.   On: 04/26/2020 22:19   Result Date: 04/24/2020 CLINICAL DATA:  74 year old male status post fall with T6 vertebral fracture and abnormal appearance of the thoracic spinal canal suggesting cord compression on CT. Lytic left 2nd and 5th posterior ribs. EXAM: MRI THORACIC WITHOUT AND WITH CONTRAST TECHNIQUE: Multiplanar and multiecho pulse sequences of the thoracic spine were obtained without and with intravenous contrast. CONTRAST:  8.75m GADAVIST GADOBUTROL 1 MMOL/ML IV SOLN COMPARISON:  CT Chest, Abdomen, and Pelvis, thoracic and lumbar spine today reported separately. FINDINGS: Limited cervical spine imaging: Suspected abnormal epidural material tracking cephalad into  the cervical spine from the thoracic spine, probably to at least the C6 level. Thoracic spine segmentation:  Normal. Alignment: Stable from the earlier CTs. No significant spondylolisthesis. Vertebrae: T6 vertebral body compression with 35% loss of vertebral body height and diffusely abnormal, decreased precontrast T1 marrow signal throughout the body, but also the right T6 pedicle and lamina. Associated intense enhancement and STIR hyperintensity. Mild retropulsion of bone or abnormal soft tissue. There are also decreased T1, STIR hyperintense, and enhancing lesions in the left T8 transverse process, T8 spinous process, right posterior T12 vertebral body, as well as the left 3rd, 5th, and 6th posterior ribs. No dispense Lea the bulky soft tissue destroying and extending out of the left 3rd rib (series 19, image 6). No other thoracic pathologic fracture identified. Cord: Diffusely abnormal spinal canal where there appears to be a large volume of T1 and T2 hyperintense, partially GRE hypointense, hemorrhage (series 16, image 9) tracking from the level of the conus throughout the  thoracic spine and into the lower cervical spine. There is widespread associated spinal cord mass effect which is mild at the cervicothoracic junction (series 18, image 1) but severe at the T4-T5 through T6-T7 levels (series 18, images 15 through 18). And dorsal to the abnormal T6 vertebra the epidural blood appears most heterogeneous and also demonstrates enhancement following contrast (series 22, image 8). See also series 18, image 18 and series 23, image 18. No superimposed dural thickening or enhancement. No abnormal spinal cord signal despite the compression. No abnormal intra-axial enhancement of the cord. Paraspinal and other soft tissues: There is also bulky abnormal prevertebral material in the mid and upper thoracic spine which is heterogeneous on T2 and GRE sequences, T1 hypointense and seems not to be enhancing. Burtis Junes this is also hemorrhage. Superimposed pleural fluid or hemothorax. There is possibly a small area of soft tissue injury superficial to the T5 and T6 spinous processes. But otherwise the dorsal paraspinal soft tissues are within normal limits. Partially visible polycystic renal disease in the upper abdomen. Disc levels: No age advanced degenerative changes. IMPRESSION: 1. 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 2. Subsequent widespread spinal cord mass effect, with up to severe cord compression T4-T5 through T6-T7. No cord signal abnormality identified, although there could be active bleeding (enhancement) within the dorsal epidural hematoma at T5 and T6. 3. Similar bulky prevertebral hematoma also suspected in the midthoracic spine. And small pleural effusions or pleural blood. 4. See also Lumbar MRI reported separately. Electronically Signed: By: Genevie Ann M.D. On: 04/29/2020 22:05   MR Lumbar Spine W  Wo Contrast  Result Date: 04/23/2020 CLINICAL DATA:  74 year old male status post fall with T6 vertebral fracture and abnormal appearance of the thoracic spinal canal suggesting cord compression on CT. Lytic left 2nd and 5th posterior ribs. EXAM: MRI LUMBAR SPINE WITHOUT AND WITH CONTRAST TECHNIQUE: Multiplanar and multiecho pulse sequences of the lumbar spine were obtained without and with intravenous contrast. CONTRAST:  8.24m GADAVIST GADOBUTROL 1 MMOL/ML IV SOLN COMPARISON:  Thoracic spine MRI today reported separately along with CT Chest, Abdomen, and Pelvis today. FINDINGS: Segmentation: Normal, concordant with the thoracic spine numbering today. Alignment: Mild straightening of lumbar lordosis. Subtle retrolisthesis of L1 on L2. subtle anterolisthesis of L4 on L5. Vertebrae: Enhancing bone lesions in the right posterior T12 and inferior L4 vertebral bodies.  Smaller enhancing lesions in the left L5 pedicle, left S3 vertebra. Compression and heterogeneous enhancement and edema throughout the L3 vertebral body. Central loss of height up to 25%. No retropulsed bone. However, there is an associated right transverse process fracture of L3 with marrow edema, no enhancement. But preserved normal marrow signal in the other L3 posterior elements. Visible SI joints appear intact. There is an expansile bone lesion of the right iliac wing on series 29, image 34. Conus medullaris and cauda equina: Only faint abnormal epidural or subdural space signal along the visible lower thoracic spinal cord and conus (series 23, image 9 and series 26, image 5. This does not continue below the T12-L1 level. Conus extends to the T12-L1 level. No lower spinal cord or conus signal abnormality. Cauda equina nerve roots appear fairly normal. No abnormal intradural enhancement in the lumbar spine. Paraspinal and other soft tissues: Polycystic renal disease. Lumbar paraspinal soft tissues are within normal limits. Disc levels: Mostly mild  for age lumbar spine degeneration. There is moderate to severe facet hypertrophy in the lower lumbar spine. No degenerative spinal stenosis. IMPRESSION: 1. 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 2. Probable pathologic fracture of the L3 vertebral body. 25% loss of height with no retropulsion. Associated fracture of the right L3 transverse process. 3. The abnormal epidural (versus subdural) Intraspinal Hemorrhage seen in the thoracic spine tapers at the level of the conus, T12-L1. No conus signal abnormality and normal cauda equina nerve roots. 4. Ordinary lower lumbar spine degeneration, but no degenerative spinal stenosis. Electronically Signed   By: Genevie Ann M.D.   On: 05/01/2020 22:16   MR ABDOMEN WWO CONTRAST  Result Date: 04/27/2020 CLINICAL DATA:  Pancreatic lesion on prior CT EXAM: MRI ABDOMEN WITHOUT AND WITH CONTRAST TECHNIQUE: Multiplanar multisequence MR imaging of the abdomen was performed both before and after the administration of intravenous contrast. CONTRAST:  20m MULTIHANCE GADOBENATE DIMEGLUMINE 529 MG/ML IV SOLN COMPARISON:  CT abdomen/pelvis dated 02/18/2020. CT abdomen/pelvis dated 01/12/2020. CTA chest dated 10/29/2019. CTA chest abdomen pelvis dated 04/13/2019. FINDINGS: Lower chest: Lung bases are clear.  Cardiomegaly. Hepatobiliary: Liver is within normal limits. No suspicious/enhancing hepatic lesions. Layering tiny gallstones (series 3/image 25). No associated inflammatory changes. No intrahepatic or extrahepatic ductal dilatation. Pancreas: Mild ductal dilatation in the distal pancreatic tail (series 3/image 18) with associated mild segmental atrophy of the proximal pancreatic tail (series 3/image 22). An underlying 14 x 18 mm hypoenhancing, non contour deforming lesion in the proximal pancreatic tail is possible (series 14/image 68), although poorly evaluated/visualized due to the limitations of this study. Spleen:   Within normal limits. Adrenals/Urinary Tract:  Adrenal glands are within normal limits. Numerous bilateral renal cysts of varying sizes and complexities, including a dominant 7.4 cm right lower pole simple cyst (series 3/image 84) and a dominant 6.7 cm left upper pole septated cyst (series 3/image 20), benign (Bosniak I-II). No enhancing renal lesions. No hydronephrosis. Stomach/Bowel: Stomach is within normal limits. Visualized bowel is unremarkable. Vascular/Lymphatic:  No evidence of abdominal aortic aneurysm. No suspicious abdominal lymphadenopathy. Other:  No abdominal ascites. Musculoskeletal: No focal osseous lesions. IMPRESSION: Possible 14 x 18 mm hypoenhancing lesion in the proximal pancreatic tail, poorly evaluated/visualized on the current study. Associated mild ductal dilatation in the distal pancreatic tail. EUS is suggested for further evaluation. These results will be called to the ordering clinician or representative by the Radiologist Assistant, and communication documented in the PACS or  [facility]. Electronically Signed   By: Julian Hy M.D.   On: 04/27/2020 08:13   CT ABDOMEN PELVIS W CONTRAST  Result Date: 04/23/2020 CLINICAL DATA:  Status post fall with subsequent back pain. EXAM: CT CHEST, ABDOMEN, AND PELVIS WITH CONTRAST TECHNIQUE: Multidetector CT imaging of the chest, abdomen and pelvis was performed following the standard protocol during bolus administration of intravenous contrast. CONTRAST:  187m OMNIPAQUE IOHEXOL 300 MG/ML  SOLN COMPARISON:  None. FINDINGS: CT CHEST FINDINGS Cardiovascular: There is moderate severity calcification of the aortic arch. There is moderate severity cardiomegaly. No pericardial effusion. Mediastinum/Nodes: No enlarged mediastinal, hilar, or axillary lymph nodes. Thyroid gland, trachea, and esophagus demonstrate no significant findings. Lungs/Pleura: A 3.7 cm x 1.5 cm low-attenuation mass is seen along the posterior aspect of the left  apex. An adjacent expansile lytic area is seen within the posterior aspect of the second left rib (axial CT image 15, CT series number 3). Moderate severity atelectasis and/or infiltrate is seen within the left lower lobe. There is no evidence of a pleural effusion or pneumothorax. Musculoskeletal: Multiple sternal wires are seen. A 4.3 cm x 2.2 cm intramuscular lipoma is seen along the anterior left chest wall. Degenerative changes are seen throughout the thoracic spine. CT ABDOMEN PELVIS FINDINGS Hepatobiliary: No focal liver abnormality is seen. No gallstones, gallbladder wall thickening, or biliary dilatation. Pancreas: Unremarkable. No pancreatic ductal dilatation or surrounding inflammatory changes. Spleen: Normal in size without focal abnormality. Adrenals/Urinary Tract: Adrenal glands are unremarkable. Innumerable cysts of various sizes are seen within both kidneys. There is no evidence of renal calculi or hydronephrosis. Bladder is unremarkable. Stomach/Bowel: Stomach is within normal limits. Appendix appears normal. No evidence of bowel wall thickening, distention, or inflammatory changes. Vascular/Lymphatic: There is moderate severity calcification and atherosclerosis throughout the abdominal aorta. No enlarged abdominal or pelvic lymph nodes. Reproductive: The prostate gland is mildly enlarged. Other: No abdominal wall hernia or abnormality. No abdominopelvic ascites. Musculoskeletal: Multilevel degenerative changes are seen throughout the lumbar spine. IMPRESSION: 1. 3.7 cm x 1.5 cm low-attenuation mass along the posterior aspect of the left apex with an adjacent expansile lytic area seen within the posterior aspect of the second left rib. 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 2. Moderate severity atelectasis and/or infiltrate within the left lower lobe. 3. Innumerable cysts of various sizes within both kidneys consistent with polycystic  kidney disease. 4. 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 5. Mild enlargement of the prostate gland. 6. Aortic atherosclerosis. Aortic Atherosclerosis (ICD10-I70.0). Electronically Signed   By: TVirgina NorfolkM.D.   On: 04/29/2020 18:33   DG Thoracic Spine 1 View  Result Date: 05/14/2020 CLINICAL DATA:  T6 laminectomy EXAM: OPERATIVE THORACIC SPINE 1 VIEW(S) FLUOROSCOPY TIME:  28 seconds COMPARISON:  MRI thoracic spine dated 05/23/2020 FINDINGS: Single intraoperative fluoroscopic image during T6 laminectomy, used for intraoperative localization. IMPRESSION: Single intraoperative fluoroscopic image during T6 laminectomy. Electronically Signed   By: SJulian HyM.D.   On: 05/14/2020 05:05   CT T-SPINE NO CHARGE  Result Date: 04/27/2020 CLINICAL DATA:  Status post fall. EXAM: CT THORACIC SPINE WITHOUT CONTRAST TECHNIQUE: Multidetector CT images of the thoracic were obtained using the standard protocol without intravenous contrast. COMPARISON:  None. FINDINGS: Alignment: Normal. Vertebrae: A compression fracture deformity is seen involving the T6 vertebral body. This is of indeterminate age. An adjacent 2.3 cm x 2.2 cm lytic area is seen within  this region. Mild paraspinal soft tissue swelling is noted. Paraspinal and other soft tissues: A 3.9 cm x 1.5 cm heterogeneous low-attenuation soft tissue mass is seen along the posterior aspect of the left apex. The adjacent portion of the posterior second left rib is expansile and lytic in appearance. Disc levels: Mild multilevel endplate sclerosis is seen with mild intervertebral disc space narrowing is seen. IMPRESSION: 1. T6 vertebral body compression fracture deformity with an associated 2.3 cm x 2.2 cm lytic area and paraspinal soft tissue swelling. Further evaluation with MRI is recommended. 2. 3.9 cm x 1.5 cm heterogeneous low-attenuation soft tissue mass along the posterior aspect of the left apex with an expansile and  lytic appearing posterior second left rib. This may represent a primary bronchogenic carcinoma. Further evaluation with a nuclear medicine PET/CT scan is recommended. Electronically Signed   By: Virgina Norfolk M.D.   On: 05/20/2020 18:39   CT L-SPINE NO CHARGE  Result Date: 05/09/2020 CLINICAL DATA:  Status post fall. EXAM: CT LUMBAR SPINE WITHOUT CONTRAST TECHNIQUE: Multidetector CT imaging of the lumbar spine was performed without intravenous contrast administration. Multiplanar CT image reconstructions were also generated. COMPARISON:  None. FINDINGS: Segmentation: 5 lumbar type vertebrae. Alignment: Normal. Vertebrae: No acute fracture. A 1.6 cm x 0.9 cm well-defined lytic area is seen within the posterior aspect of the lower L4 vertebral body (sagittal reformatted image 150, CT series number 1). Paraspinal and other soft tissues: Negative. Disc levels: Mild to moderate severity multilevel endplate sclerosis is seen with mild multilevel intervertebral disc space narrowing IMPRESSION: 1. No acute fracture within the lumbar spine. 2. Mild to moderate severity multilevel degenerative changes. 3. 1.6 cm x 0.9 cm well-defined lytic area within the posterior aspect of the lower L4 vertebral body. This may represent a small hemangioma. MRI correlation is recommended. Electronically Signed   By: Virgina Norfolk M.D.   On: 05/14/2020 18:41   DG CHEST PORT 1 VIEW  Result Date: 05/18/2020 CLINICAL DATA:  ETT placement EXAM: PORTABLE CHEST 1 VIEW COMPARISON:  Radiograph 04/14/2020 FINDINGS: Endotracheal tube in the mid trachea approximately 4.5 cm from the carina. Transesophageal tube tip terminates below the level of imaging, beyond the GE junction. Left IJ catheter tip terminates in the left brachiocephalic vein just proximal to the left brachiocephalic-caval confluence. Telemetry leads overlie the chest. Increasing opacity is seen in the right upper lobe abutting the minor fissure. Suspect some additional  opacity in the mid to lower lung as well. There are persistent bandlike opacities in the left base favoring atelectasis. Small right effusion. Left costophrenic sulcus is partially collimated. Postsurgical changes related to prior CABG including numerous mediastinal surgical clips and sternotomy sutures, the lowest of which appears fractured, similar to prior. There is stable cardiomegaly with increased attenuation of the lower mediastinum, possibly reflecting a hiatal hernia as well. A calcified tortuous aorta is present. Prior thoracic fusion. No acute osseous or soft tissue abnormality. Degenerative changes are present in the imaged spine and shoulders. IMPRESSION: 1. Lines and tubes as above. 2. Increasing opacity in the right upper lobe abutting the minor fissure. Suspect some additional opacity in the mid to lower lung as well. Could reflect developing consolidation/infection. 3. Stable bandlike opacities in the left base favoring atelectasis. 4. Small right effusion. 5. Stable cardiomegaly. Electronically Signed   By: Lovena Le M.D.   On: 05/18/2020 05:28   DG CHEST PORT 1 VIEW  Result Date: 05/14/2020 CLINICAL DATA:  Intubated EXAM: PORTABLE CHEST 1 VIEW COMPARISON:  04/29/2020 at 1517 hours FINDINGS: Endotracheal tube terminates 5.5 cm above the carina. Enteric tube terminates at the level of the diaphragm/GE junction. Left IJ venous catheter terminates in the distal left brachiocephalic vein/SVC. Mild left basilar atelectasis. Lungs are otherwise clear. No pleural effusion or pneumothorax. Cardiomegaly.  Postsurgical changes related to prior CABG. Median sternotomy.  Skin staples overlying the midline chest. IMPRESSION: Endotracheal tube terminates 5.5 cm above the carina. Enteric tube terminates at the level of the diaphragm/GE junction. Left IJ venous catheter terminates in the distal left brachiocephalic vein/SVC. No evidence of acute cardiopulmonary disease. Electronically Signed   By: Julian Hy M.D.   On: 05/14/2020 05:04   DG Chest Port 1 View  Result Date: 05/07/2020 CLINICAL DATA:  [place] [address] while walking to the bathroom with his walker, lost balance and fell into seated position, BILATERAL flank pain EXAM: PORTABLE CHEST 1 VIEW COMPARISON:  Portable exam 1517 hours compared to 01/12/2020 FINDINGS: Enlargement of cardiac silhouette post median sternotomy. Atherosclerotic calcification aorta. Mediastinal contours and pulmonary vascularity normal. LEFT basilar atelectasis without pulmonary infiltrate, pleural effusion, or pneumothorax. Bones demineralized. IMPRESSION: Enlargement of cardiac silhouette with LEFT basilar atelectasis. Electronically Signed   By: Lavonia Dana M.D.   On: 05/14/2020 15:24   DG Knee Complete 4 Views Left  Result Date: 05/15/2020 CLINICAL DATA:  Status post trauma. EXAM: LEFT KNEE - COMPLETE 4+ VIEW COMPARISON:  None. FINDINGS: No evidence of fracture, dislocation, or joint effusion. Moderate severity narrowing of the medial and lateral tibiofemoral compartment spaces is seen. There is marked severity vascular calcification. IMPRESSION: Moderate severity degenerative changes without evidence of acute osseous abnormality. Electronically Signed   By: Virgina Norfolk M.D.   On: 05/12/2020 16:11   DG Abd Portable 1V  Result Date: 05/18/2020 CLINICAL DATA:  Enteric catheter placement EXAM: PORTABLE ABDOMEN - 1 VIEW COMPARISON:  05/14/2020 FINDINGS: Frontal view of the lower chest and upper abdomen demonstrates enteric catheter coiled back upon itself overlying the region of the distal thoracic esophagus. Bowel gas pattern is unremarkable. Small bilateral pleural effusions are noted. IMPRESSION: 1. Enteric catheter coiled back upon itself overlying the distal thoracic esophagus. Recommend removal and replacement. These results will be called to the ordering clinician or representative by the Radiologist Assistant, and communication documented in the PACS or [employer]. Electronically Signed   By: Randa Ngo M.D.   On: 05/18/2020 19:35   DG Abd Portable 1V  Result Date: 04/23/2020 CLINICAL DATA:  Enteric catheter adjustment EXAM: PORTABLE ABDOMEN - 1 VIEW COMPARISON:  04/25/2020 FINDINGS: Frontal view of the lower chest and upper abdomen demonstrates tip and side port of the enteric catheter projecting over the gastric body. Bowel gas pattern is unremarkable. IMPRESSION: 1. Enteric catheter tip and side port projecting over gastric body. Electronically Signed   By: Randa Ngo M.D.   On: 05/04/2020 22:21   DG Abd Portable 1V  Result Date: 05/12/2020 CLINICAL DATA:  OG tube placement EXAM: PORTABLE ABDOMEN - 1 VIEW COMPARISON:  03/13/2020 FINDINGS: Pleural effusion thickening at the bases. Calcification at the cardiac base. Esophageal tube appears folded back upon itself with the tip directed cranially over the GE junction region. IMPRESSION: Esophageal tube appears folded back upon itself with the tip directed cranially in the region of the GE junction. Repositioning is recommended. These results will be called to the ordering clinician or representative by the Radiologist Assistant, and communication documented in the PACS or [employer]. Electronically Signed   By: Maudie Mercury  Francoise Ceo M.D.   On: 04/30/2020 21:59   EEG adult  Result Date: 05/18/2020 Lora Havens, MD     05/18/2020 12:03 PM Patient Name: [facility]. MRN: [id number] Epilepsy Attending: Lora Havens Referring Physician/Provider: Dr. Dyann Ruddle Date: 05/18/2020 Duration: 23.42 minutes Patient history: 74 year old male with altered mental status.  EEG evaluate for seizures. Level of alertness: Comatose AEDs during EEG study: Keppra, Dilantin, valproic acid, diazepam Technical aspects: This EEG study was done with scalp electrodes positioned according to the 10-20 International system of electrode placement. Electrical activity was acquired at a sampling rate of 500Hz  and  reviewed with a high frequency filter of 70Hz  and a low frequency filter of 1Hz . EEG data were recorded continuously and digitally stored. Description: EEG showed continuous generalized polymorphic low amplitude mixed frequencies including predominantly 5 to 6 Hz theta activity as well as 2 to 3 Hz delta activity.  Sharp waves were seen in right anterior temporal region, maximal F8-T8.  Hyperventilation and photic stimulation were not performed.   ABNORMALITY -Sharp waves, right anterior temporal region, maximal F8-T8 -Continuous slow, generalized IMPRESSION: This study showed evidence of epileptogenicity arising from the right anterior temporal region.  Additionally, there is evidence of severe diffuse encephalopathy, nonspecific etiology.  No seizures were seen during the study. Lora Havens   DG C-Arm 1-60 Min  Result Date: 05/14/2020 CLINICAL DATA:  T6 laminectomy EXAM: OPERATIVE THORACIC SPINE 1 VIEW(S) FLUOROSCOPY TIME:  28 seconds COMPARISON:  MRI thoracic spine dated 05/23/2020 FINDINGS: Single intraoperative fluoroscopic image during T6 laminectomy, used for intraoperative localization. IMPRESSION: Single intraoperative fluoroscopic image during T6 laminectomy. Electronically Signed   By: Julian Hy M.D.   On: 05/14/2020 05:05   DG C-Arm 1-60 Min-No Report  Result Date: 04/27/2020 Fluoroscopy was utilized by the requesting physician.  No radiographic interpretation.     ASSESSMENT & PLAN:  Louis Glazier. is a 74 y.o. 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 No issues with spontaneous bleeding at baseline.  Now admitted following a fall and status post thoracic laminectomy for hematoma evacuation and did return to the OR today for the fusion.  He did well with his surgery without any evidence of significant bleeding postoperatively.   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  1) unspecified bleeding disorder -No significant bleeding postoperatively -Hemoglobin stable, not being checked daily -Recommend daily CBC  2) Iron deficiency -- received IV Iron in the past . Lab Results  Component Value Date   IRON 196 (H) 12/29/2019   TIBC 209 12/29/2019   IRONPCTSAT 94 (H) 12/29/2019   (Iron and TIBC)  Lab Results  Component Value Date   FERRITIN 135 05/17/2020  -no indication for additional IV Iron at this time.   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 -Treatment options will be discussed as an outpatient.  Mikey Bussing, DNP, AGPCNP-BC, AOCNP Mon/Tues/Thurs/Fri 7am-5pm; Off Wednesdays Cell: [phone]   ADDENDUM  .Patient was Personally and independently interviewed, examined and relevant elements of the history of present illness were reviewed in details and an assessment and plan was created. All elements of the patient's  history of present illness , assessment and plan were discussed in details with Mikey Bussing, DNP. The above documentation reflects our combined findings assessment and plan.  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 PLAN I met with patient on 05/19/2020 after extubation and  he was quite groggy and unable to engage in detailed discussions about his medical condition.  - Patient has been reintubated for AMS and inability to control secretions. Neurology was at bedside and evaluating him off sedation -- still not awake -I met with is wife and HPOA on 5/28.and had an extensive discussion regarding his diagnosis of multiple myeloma, typical workup, poor prognosis in the setting of significant limitation to treatment consideration in light of his multiple medical co-morbidities, current medical condition and burden of medical f/u. -after extensive discussion a decision was made with his wife to see how the patient stabilizes over the weekend. -if he is medically stable we shall f/u on Tuesday and arrange for whole body skeletal survey and BM Bx. -he would not be a good candidate for many treatments (Steroids/ IV infusion -- fluid overload with systoloic CHF), Carfilzomib- heart failure decompensation, Revlimid/IMids-- increased risk of CVA/MI/VTE, Daratumumab- fluid overload. -if stable will plan to start Ninlaro D1/D8/D15 with low dose steroids - this will need to be ordered through speciality pharmacy and takes 1-2 weeks. -transfuse prn for anemia to maintain hgb>8 -Mx of post-op benefit per previous recommendations. -continue goals of care discussions and consult palliative care if needed. -I shall f/u with patient/wife on Tuesday.  Sullivan Lone MD MS TT 60 mins > 50% on direct patient contact, counseling and co-ordination of cares.

## 2020-05-19 NOTE — Progress Notes (Signed)
eLink Physician-Brief Progress Note Patient Name: [facility]. DOB: [birth year] MRN: [id number]   Date of Service  05/19/2020  HPI/Events of Note  Afib with RVR Pt needs PRN pain medication order.  eICU Interventions  Amiodarone 150 mg iv  Bolus x 1, Ofirmev 1000 mg iv Q 6 hours PRN pain x 4 doses.        Kerry Kass Collyn Selk 05/19/2020, 2:53 AM

## 2020-05-19 NOTE — Progress Notes (Signed)
eLink Physician-Brief Progress Note Patient Name: [facility]. DOB: June 05, 1946 MRN: [id number]   Date of Service  05/19/2020  HPI/Events of Note  Afib with RVR -rate persistently 120-130's despite 150 mg Amiodarone bolus, and patient is still NPO.  eICU Interventions  Amiodarone infusion ordered at 30 mg / hour.        Kerry Kass Pericles Carmicheal 05/19/2020, 5:49 AM

## 2020-05-20 ENCOUNTER — Telehealth: Payer: Self-pay

## 2020-05-20 ENCOUNTER — Inpatient Hospital Stay (HOSPITAL_COMMUNITY): Payer: Medicare Other

## 2020-05-20 DIAGNOSIS — E7220 Disorder of urea cycle metabolism, unspecified: Secondary | ICD-10-CM

## 2020-05-20 LAB — GLUCOSE, CAPILLARY
Glucose-Capillary: 75 mg/dL (ref 70–99)
Glucose-Capillary: 73 mg/dL (ref 70–99)
Glucose-Capillary: 190 mg/dL — ABNORMAL HIGH (ref 70–99)
Glucose-Capillary: 124 mg/dL — ABNORMAL HIGH (ref 70–99)
Glucose-Capillary: 163 mg/dL — ABNORMAL HIGH (ref 70–99)
Glucose-Capillary: 130 mg/dL — ABNORMAL HIGH (ref 70–99)

## 2020-05-20 LAB — TYPE AND SCREEN
ABO/RH(D): O POS
Antibody Screen: NEGATIVE
Unit division: 0
Unit division: 0

## 2020-05-20 LAB — MULTIPLE MYELOMA PANEL, SERUM
Albumin SerPl Elph-Mcnc: 2.8 g/dL — ABNORMAL LOW (ref 2.9–4.4)
Albumin/Glob SerPl: 0.6 — ABNORMAL LOW (ref 0.7–1.7)
Alpha 1: 0.3 g/dL (ref 0.0–0.4)
Alpha2 Glob SerPl Elph-Mcnc: 0.5 g/dL (ref 0.4–1.0)
B-Globulin SerPl Elph-Mcnc: 0.7 g/dL (ref 0.7–1.3)
Gamma Glob SerPl Elph-Mcnc: 3.4 g/dL — ABNORMAL HIGH (ref 0.4–1.8)
Globulin, Total: 4.9 g/dL — ABNORMAL HIGH (ref 2.2–3.9)
IgA: 248 mg/dL (ref 61–437)
IgG (Immunoglobin G), Serum: 3815 mg/dL — ABNORMAL HIGH (ref 603–1613)
IgM (Immunoglobulin M), Srm: 28 mg/dL (ref 15–143)
M Protein SerPl Elph-Mcnc: 2.8 g/dL — ABNORMAL HIGH
Total Protein ELP: 7.7 g/dL (ref 6.0–8.5)

## 2020-05-20 LAB — POCT I-STAT 7, (LYTES, BLD GAS, ICA,H+H)
Acid-base deficit: 6 mmol/L — ABNORMAL HIGH (ref 0.0–2.0)
Bicarbonate: 15.4 mmol/L — ABNORMAL LOW (ref 20.0–28.0)
Calcium, Ion: 1.12 mmol/L — ABNORMAL LOW (ref 1.15–1.40)
HCT: 40 % (ref 39.0–52.0)
Hemoglobin: 13.6 g/dL (ref 13.0–17.0)
O2 Saturation: 100 %
Patient temperature: 98
Potassium: 5.1 mmol/L (ref 3.5–5.1)
Sodium: 148 mmol/L — ABNORMAL HIGH (ref 135–145)
TCO2: 16 mmol/L — ABNORMAL LOW (ref 22–32)
pCO2 arterial: 21.7 mmHg — ABNORMAL LOW (ref 32.0–48.0)
pH, Arterial: 7.457 — ABNORMAL HIGH (ref 7.350–7.450)
pO2, Arterial: 368 mmHg — ABNORMAL HIGH (ref 83.0–108.0)

## 2020-05-20 LAB — BPAM RBC
Blood Product Expiration Date: 202106232359
Blood Product Expiration Date: 202106232359
ISSUE DATE / TIME: 202105241825
ISSUE DATE / TIME: 202105241825
Unit Type and Rh: 5100
Unit Type and Rh: 5100

## 2020-05-20 LAB — CBC
HCT: 29.9 % — ABNORMAL LOW (ref 39.0–52.0)
Hemoglobin: 9.4 g/dL — ABNORMAL LOW (ref 13.0–17.0)
MCH: 29.6 pg (ref 26.0–34.0)
MCHC: 31.4 g/dL (ref 30.0–36.0)
MCV: 94 fL (ref 80.0–100.0)
Platelets: 104 10*3/uL — ABNORMAL LOW (ref 150–400)
RBC: 3.18 MIL/uL — ABNORMAL LOW (ref 4.22–5.81)
RDW: 20.2 % — ABNORMAL HIGH (ref 11.5–15.5)
WBC: 9.4 10*3/uL (ref 4.0–10.5)
nRBC: 8.9 % — ABNORMAL HIGH (ref 0.0–0.2)

## 2020-05-20 LAB — COMPREHENSIVE METABOLIC PANEL
ALT: 732 U/L — ABNORMAL HIGH (ref 0–44)
AST: 2049 U/L — ABNORMAL HIGH (ref 15–41)
Albumin: 2.2 g/dL — ABNORMAL LOW (ref 3.5–5.0)
Alkaline Phosphatase: 92 U/L (ref 38–126)
Anion gap: 13 (ref 5–15)
BUN: 49 mg/dL — ABNORMAL HIGH (ref 8–23)
CO2: 16 mmol/L — ABNORMAL LOW (ref 22–32)
Calcium: 7.8 mg/dL — ABNORMAL LOW (ref 8.9–10.3)
Chloride: 117 mmol/L — ABNORMAL HIGH (ref 98–111)
Creatinine, Ser: 2.67 mg/dL — ABNORMAL HIGH (ref 0.61–1.24)
GFR calc Af Amer: 27 mL/min — ABNORMAL LOW (ref >60)
GFR calc non Af Amer: 23 mL/min — ABNORMAL LOW (ref >60)
Glucose, Bld: 160 mg/dL — ABNORMAL HIGH (ref 70–99)
Potassium: 5.5 mmol/L — ABNORMAL HIGH (ref 3.5–5.1)
Sodium: 146 mmol/L — ABNORMAL HIGH (ref 135–145)
Total Bilirubin: 3.8 mg/dL — ABNORMAL HIGH (ref 0.3–1.2)
Total Protein: 8.5 g/dL — ABNORMAL HIGH (ref 6.5–8.1)

## 2020-05-20 LAB — BRAIN NATRIURETIC PEPTIDE: B Natriuretic Peptide: 1329.5 pg/mL — ABNORMAL HIGH (ref 0.0–100.0)

## 2020-05-20 LAB — UPEP/UIFE/LIGHT CHAINS/TP, 24-HR UR
% BETA, Urine: 13.6 %
ALPHA 1 URINE: 3.1 %
Albumin, U: 38.8 %
Alpha 2, Urine: 7.3 %
Free Kappa Lt Chains,Ur: 26.2 mg/L (ref 0.63–113.79)
Free Kappa/Lambda Ratio: 11.05 (ref 1.03–31.76)
Free Lambda Lt Chains,Ur: 2.37 mg/L (ref 0.47–11.77)
GAMMA GLOBULIN URINE: 37.3 %
M-SPIKE %, Urine: 23 % — ABNORMAL HIGH
M-Spike, Mg/24 Hr: 30 mg/24 hr — ABNORMAL HIGH
Total Protein, Urine-Ur/day: 133 mg/24 hr (ref 30–150)
Total Protein, Urine: 26.5 mg/dL

## 2020-05-20 LAB — VALPROIC ACID LEVEL: Valproic Acid Lvl: 21 ug/mL — ABNORMAL LOW (ref 50.0–100.0)

## 2020-05-20 LAB — MAGNESIUM
Magnesium: 1.8 mg/dL (ref 1.7–2.4)
Magnesium: 2 mg/dL (ref 1.7–2.4)

## 2020-05-20 LAB — PHOSPHORUS
Phosphorus: 4.2 mg/dL (ref 2.5–4.6)
Phosphorus: 4.3 mg/dL (ref 2.5–4.6)

## 2020-05-20 LAB — PHENYTOIN LEVEL, TOTAL: Phenytoin Lvl: 12.1 ug/mL (ref 10.0–20.0)

## 2020-05-20 LAB — AMMONIA: Ammonia: 111 umol/L — ABNORMAL HIGH (ref 9–35)

## 2020-05-20 MED ORDER — RIFAXIMIN 550 MG PO TABS
550.0000 mg | ORAL_TABLET | Freq: Three times a day (TID) | ORAL | Status: DC
  Administered 2020-05-20 – 2020-05-21 (×2): 550 mg
  Filled 2020-05-20 (×2): qty 1

## 2020-05-20 MED ORDER — RIFAXIMIN 550 MG PO TABS: 550.0000 mg | ORAL_TABLET | Freq: Three times a day (TID) | ORAL | Status: DC

## 2020-05-20 MED ORDER — ETOMIDATE 2 MG/ML IV SOLN
INTRAVENOUS | Status: AC
  Filled 2020-05-20: qty 10

## 2020-05-20 MED ORDER — LACTULOSE 10 GM/15ML PO SOLN: 20.0000 g | Freq: Two times a day (BID) | ORAL | Status: DC

## 2020-05-20 MED ORDER — AMIODARONE HCL 200 MG PO TABS
200.0000 mg | ORAL_TABLET | Freq: Two times a day (BID) | ORAL | Status: DC
  Administered 2020-05-20 – 2020-05-21 (×2): 200 mg
  Filled 2020-05-20 (×2): qty 1

## 2020-05-20 MED ORDER — ETOMIDATE 2 MG/ML IV SOLN
30.0000 mg | Freq: Once | INTRAVENOUS | Status: AC
  Administered 2020-05-20: 30 mg via INTRAVENOUS

## 2020-05-20 MED ORDER — LACTULOSE 10 GM/15ML PO SOLN
30.0000 g | Freq: Three times a day (TID) | ORAL | Status: DC
  Administered 2020-05-20 – 2020-05-24 (×11): 30 g
  Filled 2020-05-20 (×12): qty 45

## 2020-05-20 MED ORDER — LEVETIRACETAM 100 MG/ML PO SOLN
1000.0000 mg | Freq: Two times a day (BID) | ORAL | Status: DC
  Administered 2020-05-20 – 2020-06-02 (×26): 1000 mg
  Filled 2020-05-20 (×26): qty 10

## 2020-05-20 MED ORDER — FENTANYL CITRATE (PF) 100 MCG/2ML IJ SOLN
25.0000 ug | INTRAMUSCULAR | Status: DC | PRN
  Administered 2020-05-21: 50 ug via INTRAVENOUS
  Administered 2020-05-22 – 2020-05-25 (×7): 100 ug via INTRAVENOUS
  Administered 2020-05-25: 50 ug via INTRAVENOUS
  Administered 2020-05-25 – 2020-05-27 (×19): 100 ug via INTRAVENOUS
  Administered 2020-05-28 – 2020-05-30 (×2): 50 ug via INTRAVENOUS
  Administered 2020-05-30 – 2020-06-02 (×5): 100 ug via INTRAVENOUS
  Filled 2020-05-20 (×37): qty 2

## 2020-05-20 MED ORDER — FUROSEMIDE 40 MG PO TABS
40.0000 mg | ORAL_TABLET | Freq: Every day | ORAL | Status: DC
  Administered 2020-05-20 – 2020-05-25 (×6): 40 mg
  Filled 2020-05-20 (×6): qty 1

## 2020-05-20 MED ORDER — FENTANYL CITRATE (PF) 100 MCG/2ML IJ SOLN
25.0000 ug | INTRAMUSCULAR | Status: AC | PRN
  Administered 2020-05-20 – 2020-05-30 (×3): 25 ug via INTRAVENOUS
  Filled 2020-05-20 (×3): qty 2

## 2020-05-20 MED ORDER — ORAL CARE MOUTH RINSE
15.0000 mL | OROMUCOSAL | Status: DC
  Administered 2020-05-20 – 2020-06-02 (×133): 15 mL via OROMUCOSAL

## 2020-05-20 MED ORDER — DOCUSATE SODIUM 50 MG/5ML PO LIQD
100.0000 mg | Freq: Two times a day (BID) | ORAL | Status: DC
  Administered 2020-05-25 – 2020-05-29 (×3): 100 mg
  Filled 2020-05-20 (×5): qty 10

## 2020-05-20 MED ORDER — PANTOPRAZOLE SODIUM 40 MG PO PACK
40.0000 mg | PACK | Freq: Every day | ORAL | Status: DC
  Administered 2020-05-21 – 2020-06-02 (×12): 40 mg
  Filled 2020-05-20 (×12): qty 20

## 2020-05-20 MED ORDER — PRO-STAT SUGAR FREE PO LIQD
30.0000 mL | Freq: Two times a day (BID) | ORAL | Status: DC
Start: 2020-05-20 — End: 2020-05-20

## 2020-05-20 MED ORDER — DEXTROSE-NACL 5-0.9 % IV SOLN: INTRAVENOUS | Status: DC

## 2020-05-20 MED ORDER — PIPERACILLIN-TAZOBACTAM 3.375 G IVPB
3.3750 g | Freq: Three times a day (TID) | INTRAVENOUS | Status: AC
  Administered 2020-05-20 – 2020-05-26 (×21): 3.375 g via INTRAVENOUS
  Filled 2020-05-20 (×21): qty 50

## 2020-05-20 MED ORDER — DEXMEDETOMIDINE HCL IN NACL 400 MCG/100ML IV SOLN
0.0000 ug/kg/h | INTRAVENOUS | Status: AC
  Administered 2020-05-20: 0.4 ug/kg/h via INTRAVENOUS
  Administered 2020-05-20: 0.8 ug/kg/h via INTRAVENOUS
  Filled 2020-05-20: qty 100

## 2020-05-20 MED ORDER — PIVOT 1.5 CAL PO LIQD
1000.0000 mL | ORAL | Status: DC
  Administered 2020-05-20 – 2020-06-01 (×14): 1000 mL
  Filled 2020-05-20: qty 1000

## 2020-05-20 MED ORDER — BISACODYL 10 MG RE SUPP
10.0000 mg | Freq: Once | RECTAL | Status: AC
  Administered 2020-05-20: 10 mg via RECTAL
  Filled 2020-05-20: qty 1

## 2020-05-20 MED ORDER — VALPROIC ACID 250 MG/5ML PO SOLN
125.0000 mg | Freq: Four times a day (QID) | ORAL | Status: DC
  Administered 2020-05-20: 125 mg
  Filled 2020-05-20: qty 5

## 2020-05-20 MED ORDER — DOCUSATE SODIUM 50 MG/5ML PO LIQD: 100.0000 mg | Freq: Two times a day (BID) | ORAL | Status: DC

## 2020-05-20 MED ORDER — PIPERACILLIN-TAZOBACTAM 3.375 G IVPB 30 MIN
3.3750 g | INTRAVENOUS | Status: AC
  Administered 2020-05-20: 3.375 g via INTRAVENOUS
  Filled 2020-05-20: qty 50

## 2020-05-20 NOTE — Procedures (Signed)
  Procedure: endotracheal intubation Diagnosis: acute hypoxemic respiratory failure   I came to assess the patient he was obtunded and not able to control his thick secretions and in moderate respiratory distress. A decision was made to intubate the patient after 30mg  Etomidate. Size 8 ETT was introduced with glidoscope, there was some difficulty passing the tube through the vocal cords and took some manipulation. Patient tolerated the procedure well no complications and CXR was ordered

## 2020-05-20 NOTE — Progress Notes (Addendum)
Per request of Dr. Lorraine Lax of Neurology, this RN contacted Dr. Lucile Shutters of E-link/critical care and made him aware of patient's elevated ammonia level of 111. Dr. Lorraine Lax requested critical care manage lactulose dose. I also let Dr. Lucile Shutters know that Dr. Lorraine Lax wishes to hold valproic acid tonight.

## 2020-05-20 NOTE — Progress Notes (Signed)
NAME:  Louis Hyle., MRN:  [id number], DOB:  28-Oct-1946, LOS: 7 ADMISSION DATE:  05/15/2020, CONSULTATIO care unit N DATE:  05/20/20 REFERRING MD:  Neurosurgery, CHIEF COMPLAINT:   Fall  Brief History   74 year old male who presented to the emergency department 5/21 after a fall while walking to the bathroom was found to have pathologic compression fracture of T6 and large volume spinal epidural hemorrhage requiring hematoma evacuation and thoracic laminectomy.  He was intubated for procedure and transferred to the intensive care unit postop.  History of present illness   74 year old male with a history of hypertension, hyperlipidemia, CAD s/p CABG 2011, ischemic cardiomyopathy HFrEF, AICD, atrial fibrillation, CVA, seizure disorder, factor VII deficiency, not clearly diagnosed bleeding diathesis despite extensive work-up who was walking to the bathroom with his walker when he lost his balance and fell to seated position after his legs just gave out on him.  He was having significant pain on presentation to the ED and was found to have pathologic compression fracture of T6  large volume of Spinal Epidural Hemorrhage spanning from the cervical spine throughout the thoracic spine to the conus.  Widespread spinal cord mass-effect.  Neurosurgery and hematology evaluated, recommendation for 3 units FFP, 1 unit of platelets and 1 g of TXA  preprocedure, 1 unit platelets, 1 g TXA post procedure with additional FFP depending on volume status.  Neurosurgery performed T6 laminectomy and epidural clot evacuation.  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   Significant Hospital Events   5/21 admit to neurosurgery 5/22 to ICU postop 5/24 - OR for T4-T8 arthrodesis. 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   Micro Data:  5/21 MRSA screen>> negative 5/21 SARS-Cov-2>> negative 5/21 urine culture>>   Antimicrobials:  Ceftriaxone 5/21-  Interim history/subjective:  Remains encephalopathic following surgery.  Successfully extubated.  Now following some commands but remains lethargic.  Objective   Blood pressure 102/68, pulse (!) 111, temperature 99 F (37.2 C), temperature source Axillary, resp. rate (!) 29, height 6' 2"  (1.88 m), weight 87 kg, SpO2 100 %.    Vent Mode: PRVC FiO2 (%):  [40 %-100 %] 40 % Set Rate:  [18 bmp] 18  bmp Vt Set:  [660 mL] 660  mL PEEP:  [5 cmH20] 5 cmH20 Plateau Pressure:  [18 cmH20] 18 cmH20   Intake/Output Summary (Last 24 hours) at 05/20/2020 0729 Last data filed at 05/20/2020 0700 Gross per 24 hour  Intake 1080.75 ml  Output 870 ml  Net 210.75 ml   Filed Weights   05/14/20 1200 05/06/2020 1436  Weight: 87 kg 87 kg    General: Elderly male, ventilated. HEENT: senile arcus, no icterus, MM pink/moist, ETT in place. Neuro: Moves spontaneously but no response to voice, minimal response to pain. Pupils small and minimally responsive. CV: s1s2 RRR, no m/r/g. Prior sternotomy PULM: CTA B.  On full support. Acceptable airway pressures.  GI: soft, bsx4 active  Extremities: warm/dry, no edema  Skin: no rashes or lesions  Resolved Hospital Problem list     Assessment & Plan:    Critically ill due to respiratory failure requiring mechanical ventilation. -Full ventilatory support.  - Chest PT - Minimize sedation to avoid delirium as mental status ran him into problems with poor respiratory status.   Toxic metabolic encephalopathy which is improving.  No seizures on EEG -Minimize sedative medications. -Allow time for hypoactive delirium to clear. -CT head to rule out hemorrhage given bleeding diathesis.  Fall with pathologic T6 fracture and epidural hematoma causing cord compression.  Status post T6 laminectomy and spine stabilization 5/25. Myeloma related with IgG Kappa spike - Oncology is seeing patient.  Bleeding diathesis, likely paraprotein related.  -Extensive work-up by Dr. Irene Limbo and at [facility] by [name] -Work-up for possible multiple myeloma per hematology -pathology pending -Consider TXA infusion if requires further OR -Empirically transfuse FFP. -Consider tag to better define clotting disorder.  History of seizure disorder Sound like both tonic-clonic, followed by neurology  HFrEF, CAD s/p CABG, AICD, atrial fibrillation, hypertension, hyperlipidemia Last  echocardiogram in 03/2020 with EF of 30 to 35%, not chronically anti-coagulated given bleeding disorder -Resume Coreg, amiodarone once extubated. -Continue Lasix, monitor volume status and I&O's   Daily Goals Checklist  Pain/Anxiety/Delirium protocol (if indicated): stopped all except oxycodone. Keep off sedative infusions if possible Neuro vitals: every 2 hours AED's: Continue home AED's VAP protocol (if indicated): bundle in place, chest PT Respiratory support goals: Full support. SBT once mental status improves. Blood pressure target: Allow autoregulation. DVT prophylaxis: SCD's only due to bleeding diathesis.  Nutrition Status: For Cortrak tube today  GI prophylaxis: pantoprazole Fluid status goals: allow autoregulation.  Urinary catheter: Condom Central lines: L IJ CVC Glucose control: adequate control of T2DM with SSI. Mobility/therapy needs: Bedrest then PT once extubated.  Antibiotic de-escalation: Zosyn for 7 days to cover HCAP Home medication reconciliation:  Home AED, HF meds on hold Daily labs: CBC, BMP Code Status: Full  Family Communication: per Neurosurgery Disposition: ICU    Labs   CBC: Recent Labs  Lab 05/07/2020 1531 04/25/2020 1551 05/15/20 1717 05/15/20 1717 05/22/2020 0201 05/10/2020 0201 05/19/2020 0958 05/11/2020 1758 04/30/2020 2101 05/17/20 0333 05/20/20 0141  WBC 3.5*   < > 7.8  --  8.0  --  7.9  --  6.1 6.1  --   NEUTROABS 2.6  --   --   --   --   --   --   --   --  5.5  --   HGB 12.6*   < > 8.5*   < > 8.4*   < > 8.7* 9.2* 8.8* 8.8* 13.6  HCT 41.5   < > 28.2*   < > 27.8*   < > 28.3* 27.0*  27.8* 27.0* 40.0  MCV 91.4   < > 92.2  --  93.0  --  93.1  --  91.7 89.4  --   PLT 191   < > 207  --  208  --  202  --  179 188  --    < > = values in this interval not displayed.    Basic Metabolic Panel: Recent Labs  Lab 05/11/2020 1531 05/15/2020 1531 04/26/2020 1551 05/14/20 0114 05/14/20 0159 05/14/20 0336 05/14/20 0654 04/23/2020 1758 05/17/20 0333  05/20/20 0141  NA 135   < > 139   < > 140 141  --  145 140 148*  K 4.0   < > 3.6   < > 3.8 3.6  --  4.4 4.3 5.1  CL 104  --  104  --   --   --   --   --  111  --   CO2 23  --   --   --   --   --   --   --  21*  --   GLUCOSE 129*  --  122*  --   --   --   --   --  166*  --   BUN 36*  --  41*  --   --   --   --   --  24*  --   CREATININE 1.51*  --  1.40*  --   --   --   --   --  1.52*  --   CALCIUM 8.4*  --   --   --   --   --   --   --  8.1*  --   MG  --   --   --   --   --   --  1.5*  --   --   --   PHOS  --   --   --   --   --   --  4.1  --   --   --    < > = values in this interval not displayed.   GFR: Estimated Creatinine Clearance: 51.8 mL/min (A) (by C-G formula based on SCr of 1.52 mg/dL (H)). Recent Labs  Lab 05/20/2020 0201 04/28/2020 0958 05/07/2020 2101 05/17/20 0333  WBC 8.0 7.9 6.1 6.1    Liver Function Tests: Recent Labs  Lab 04/25/2020 1531  AST 36  ALT 28  ALKPHOS 99  BILITOT 1.0  PROT 10.8*  ALBUMIN 2.9*   No results for input(s): LIPASE, AMYLASE in the last 168 hours. No results for input(s): AMMONIA in the last 168 hours.  ABG    Component Value Date/Time   PHART 7.457 (H) 05/20/2020 0141   PCO2ART 21.7 (L) 05/20/2020 0141   PO2ART 368 (H) 05/20/2020 0141   HCO3 15.4 (L) 05/20/2020 0141   TCO2 16 (L) 05/20/2020 0141   ACIDBASEDEF 6.0 (H) 05/20/2020 0141   O2SAT 100.0 05/20/2020 0141     Coagulation Profile: Recent Labs  Lab 05/10/2020 1531 05/14/20 0654 04/30/2020 2101  INR 1.4* 1.3* 1.4*    Cardiac Enzymes: No results for input(s): CKTOTAL, CKMB, CKMBINDEX, TROPONINI in the last 168 hours.  HbA1C: Hgb A1c MFr Bld  Date/Time Value Ref Range Status  05/14/2020 06:54 AM 6.1 (H) 4.8 - 5.6 % Final    Comment:    (NOTE) Pre diabetes:          5.7%-6.4% Diabetes:              >  6.4% Glycemic control for   <7.0% adults with diabetes   07/25/2019 08:22 PM 5.2 4.8 - 5.6 % Final    Comment:    (NOTE) Pre diabetes:          5.7%-6.4% Diabetes:               >6.4% Glycemic control for   <7.0% adults with diabetes     CBG: Recent Labs  Lab 05/19/20 2007 05/19/20 2038 05/19/20 2319 05/19/20 2344 05/20/20 0353  GLUCAP 65* 82 38* 118* 75    CRITICAL CARE Performed by: Kipp Brood   Total critical care time: 40 minutes  Critical care time was exclusive of separately billable procedures and treating other patients.  Critical care was necessary to treat or prevent imminent or life-threatening deterioration.  Critical care was time spent personally by me on the following activities: development of treatment plan with patient and/or surrogate as well as nursing, discussions with consultants, evaluation of patient's response to treatment, examination of patient, obtaining history from patient or surrogate, ordering and performing treatments and interventions, ordering and review of laboratory studies, ordering and review of radiographic studies, pulse oximetry, re-evaluation of patient's condition and participation in multidisciplinary rounds.  Kipp Brood, MD [facility] ICU Physician [place]  Pager: [phone] Mobile: [phone] After hours: [phone].

## 2020-05-20 NOTE — Progress Notes (Addendum)
Ammonia 111- likely cause for mental status  Impression Hepatic coma- will start lactulose and inform ICU team Hold VPA

## 2020-05-20 NOTE — Procedures (Signed)
Cortrak  Person Inserting Tube:  Denis Koppel E, RD Tube Type:  Cortrak - 43 inches Tube Location:  Left nare Initial Placement:  Stomach Secured by: Bridle Technique Used to Measure Tube Placement:  Documented cm marking at nare/ corner of mouth Cortrak Secured At:  75 cm    Cortrak Tube Team Note:  Consult received to place a Cortrak feeding tube.   No x-ray is required. RN may begin using tube.     If the tube becomes dislodged please keep the tube and contact the Cortrak team at [email or web address] ([email or web address]) for replacement.  If after hours and replacement cannot be delayed, place a NG tube and confirm placement with an abdominal x-ray.    Louis Maniscalco, MS, RD, LDN RD pager number and weekend/on-call pager number located in Amion.   

## 2020-05-20 NOTE — Progress Notes (Signed)
Updated E-Link RN who said she will speak with E-Link MD. This RN is concerned about patient not protecting his airway. Patient is tachypnic, with audible secretion sounds from patient, and patient not coughing to command to help clear airway. Oral suctioning having limited effect, so this RN requested an order for NT suction, and to perhaps have the CCM ground team evaluate patient. Patient currently has oxygen saturation at 91. Will continue to monitor.

## 2020-05-20 NOTE — Progress Notes (Signed)
Nutrition Follow-up  DOCUMENTATION CODES:   Not applicable  INTERVENTION:   Pivot 1.5 @ 40 ml/hr increase to 60 ml/hr in 12 hours  Provides: 2160 kcal, 135 grams protein, and 1092 ml free water.   Monitor magnesium and phosphorus every 12 hours x 4 occurances, MD to replete as needed, as pt is at risk for refeeding syndrome given poor nutrition this admission (6 days NPO/CLD).   NUTRITION DIAGNOSIS:   Increased nutrient needs related to post-op healing as evidenced by estimated needs. Ongoing.   GOAL:   Patient will meet greater than or equal to 90% of their needs Not met.   MONITOR:   Vent status, TF tolerance  REASON FOR ASSESSMENT:   Malnutrition Screening Tool    ASSESSMENT:   Pt with PMH of HTN, HLD, CAD s/p CABG, ischemic cardiomyopathy, CHF, AICD, Afib, CVA, bleeding diathesis factor VII deficiency, and sz disorder who was admitted 5/21 after falling while using walker to walk to his bathroom with compression fx of t6, large volume spinal epidural hemorrhage.   Pt discussed during ICU rounds and with RN.   5/21 s/p thoracic laminectomy for hematoma evacuation, spinal tumor removed; pathology pending 5/22 extubated 5/24 s/p T4-T8 arthrodesis; T6 with tumor 5/26 extubated 5/28 re-intubated early this am; plan for cortrak this am  Patient is currently intubated on ventilator support MV: 15.4 L/min Temp (24hrs), Avg:99 F (37.2 C), Min:98.9 F (37.2 C), Max:99.3 F (37.4 C)   Medications reviewed and include: colace, lasix, SSI, miralax, senokot-s Levophed @ 10 mcg  Labs reviewed BNP: 1329   Diet Order:   Diet Order            Diet NPO time specified  Diet effective midnight              EDUCATION NEEDS:   No education needs have been identified at this time  Skin:  Skin Assessment: Reviewed RN Assessment(back incision)  Last BM:  unknown  Height:   Ht Readings from Last 1 Encounters:  05/14/2020 6' 2"  (1.88 m)    Weight:   Wt  Readings from Last 1 Encounters:  05/06/2020 87 kg    Ideal Body Weight:  78.1 kg  BMI:  Body mass index is 24.63 kg/m.  Estimated Nutritional Needs:   Kcal:  2200  Protein:  130-150 grams  Fluid:  > 2 L/day  Lockie Pares., RD, LDN, CNSC See AMiON for contact information

## 2020-05-20 NOTE — Progress Notes (Signed)
Patient was obtunded not controlling his thick secretions and was in moderate distress so he was intubated. After intubation there was thick secretions occluding the ETT. Sputum culture ordered and started on zosyn

## 2020-05-20 NOTE — Telephone Encounter (Signed)
Patient's wife; Marcell Barlow requesting to speak with Dr. Irene Limbo. Patient had surgery on Friday, had mass taken out. He is currently bedbound and admitted in [facility] and can not be seen outpatient. The plan is to have him transferred to a SNF. Wife would like to discuss with MD on next plan for his myeloma treatment. She states its an emergency.  Informed Mardene Celeste; Per Dr. Irene Limbo: I shall be seeing patient in the hospital after clinic this afternoon and shall be discussing possible treatment options that are possible and that he may be able to tolerate with his multiple medical issues.

## 2020-05-20 NOTE — Progress Notes (Addendum)
eLink Physician-Brief Progress Note Patient Name: [facility]. DOB: [birth year] MRN: [id number]   Date of Service  05/20/2020  HPI/Events of Note  Ammonia level 111, patient is encephalopathic.  eICU Interventions  Lactulose 30 gm tid, Xifaxan 550 mg tid ordered, a.m. Ammonia level ordered.        Louis Ford Demaurion Dicioccio 05/20/2020, 9:04 PM

## 2020-05-20 NOTE — Progress Notes (Signed)
Neurosurgery MD Marcello Moores updated by charge nurse Roselyn Reef about patient. Per charge nurse, MD made aware that patient is not following commands, minimally responsive, and pending intubation by critical care. No new orders from neurosurgery. Will continue to monitor.

## 2020-05-20 NOTE — Progress Notes (Signed)
eLink Physician-Brief Progress Note Patient Name: Louis Ford (Dp/Snf). DOB: [birth year] MRN: [id number]   Date of Service  05/20/2020  HPI/Events of Note  Pt is somnolent with agonal respirations, no response to being vigorously roused by bedside RN.  eICU Interventions  Stat portable CXR, stat ABG, I've asked the ground crew to come and assess him for intubation.        Kerry Kass Carlas Vandyne 05/20/2020, 12:15 AM

## 2020-05-20 NOTE — Progress Notes (Signed)
EEG complete - results pending 

## 2020-05-20 NOTE — Progress Notes (Signed)
Patient ID: Louis Ford., male   DOB: 01/18/1946, 74 y.o.   MRN: [id number] BP (!) 115/98   Pulse (!) 118   Temp 98.9 F (37.2 C) (Axillary)   Resp (!) 27   Ht 6\' 2"  (1.88 m) Comment: Measured by RT  Wt 87 kg   SpO2 95%   BMI 24.63 kg/m  Intubated overnight due to respiratory difficulties Moving all extremities Wound is clean, dry, no signs of infection Not progressing well.

## 2020-05-20 NOTE — Consult Note (Signed)
Requesting Physician: Dr. Lynetta Mare    Chief Complaint: Altered mental status, facial twitching  History obtained from:  Chart review   HPI:                                                                                                                                       [facility]. is a 74 y.o. 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  Work-up also revealed a lytic lesions in his vertebral bones concerning for multiple myeloma.  Neurosurgery performed T6 laminectomy and epidural clot evacuation.  Hematology consulted for multiple myeloma evaluation.  Patient continued to remain delirious after the surgery.  Routine EEG performed showed no seizures.  However around midnight/early morning patient had significantly more somnolent with agonal respirations and patient was intubated.  He remains off sedation not having purposeful movements.  CT head was repeated showed no acute findings, chronic left cortical infarct.  Neurology consulted to evaluate for possible subclinical seizures given patient's history of refractory epilepsy.  Seizure history Patient sees Dr. Ellouise Newer.  Last visit 05/04/20 Patient has seizures suggestive of focal to bilateral tonic-clonic epilepsy arising from the temporal lobe likely left temporal region.  Patient is on Dilantin 100 twice daily, Depakote ER 500 mg nightly as well as Keppra 1 g twice daily. Also has cognitive decline continue Moca score 20/30   Past Medical History:  Diagnosis Date  . AICD (automatic cardioverter/defibrillator) present   . Atrial fibrillation   09/22/2012  . Blood loss anemia 04/18/2017   After GI bleed from colonoscopy and polypectomy  . CAD (coronary artery disease)    . Chronic systolic heart failure ([place])   . Factor VII deficiency ([place]) 05/2011  . Factor VII deficiency ([place]) 10/07/2012  . GIB (gastrointestinal bleeding) 02/20/2017  . History of MRSA infection 05/2011  . HTN (hypertension)   . Hx of adenomatous colonic polyps 02/20/2017   01/2017 - 3 cm sigmoid TV adenoma and other smaller polyps - had post-polypectomy bleed Tx w/ clips Consider repeat colonoscopy 3 yrs [name], MD, [name]   . Hyperlipidemia   . implantable cardiac defibrillator-Biotronik    Device Implanted 2006; s/p gen change 03/2011 : bleeding persistent with pocket erosion and infection; explant and reimplant  06/2011  . Ischemic cardiomyopathy    EF 15 to 20% by TTE and TEE in 09/2012.  Severe LV dysfunction  . Obesity    BMI 31 in 09/2012  . Persistent atrial fibrillation with rapid ventricular response ([place]) 04/21/2014  . Retroperitoneal bleed 04/21/2019  . Seizure disorder ([place]) latest 09/30/2012  . Seizures ([place])   . Stroke [facility])     Past Surgical History:  Procedure Laterality Date  .  ALVEOLOPLASTY N/A 07/26/2019   Procedure: ALVEOLOPLASTY WITH RESUTURING OF ORAL WOUND;  Surgeon: [name], [name];  Location: [facility] ORS;  Service: Oral Surgery;  Laterality: N/A;  . CARDIAC CATHETERIZATION N/A 10/12/2015   Procedure: Right Heart Cath;  Surgeon: [name], MD;  Location: [place] CV LAB;  Service: Cardiovascular;  Laterality: N/A;  . CARDIOVERSION  09/24/2012   Procedure: CARDIOVERSION;  Surgeon: [name], MD;  Location: [facility] ENDOSCOPY;  Service: Cardiovascular;  Laterality: N/A;  . CARDIOVERSION N/A 12/25/2017   Procedure: CARDIOVERSION;  Surgeon: [name], MD;  Location: [facility]S;  Service: Cardiovascular;  Laterality: N/A;  . COLONOSCOPY W/ POLYPECTOMY  02/13/2017  . CORONARY ARTERY BYPASS GRAFT  2011   in [place] N/A 08/12/2019   Procedure: CAUTERIZATION OF ORAL BLEEDING;  Surgeon: [name], [name];  Location: [facility];  Service: Oral  Surgery;  Laterality: N/A;  . FLEXIBLE SIGMOIDOSCOPY N/A 02/20/2017   Procedure: FLEXIBLE SIGMOIDOSCOPY;  Surgeon: [name], MD;  Location: [facility] ENDOSCOPY;  Service: Endoscopy;  Laterality: N/A;  . FLEXIBLE SIGMOIDOSCOPY N/A 02/21/2017   Procedure: FLEXIBLE SIGMOIDOSCOPY;  Surgeon: [name], MD;  Location: [facility] ENDOSCOPY;  Service: Endoscopy;  Laterality: N/A;  . ICD    . ICD GENERATOR CHANGEOUT N/A 09/10/2019   Procedure: [place];  Surgeon: [name], MD;  Location: [place] CV LAB;  Service: Cardiovascular;  Laterality: N/A;  . ICD LEAD REMOVAL Right 11/13/2019   Procedure: ICD EXTRACTION and Lead Extraction;  Surgeon: [name], MD;  Location: [place];  Service: Cardiovascular;  Laterality: Right;  Dr. Servando Snare for backup  . LAMINECTOMY WITH POSTERIOR LATERAL ARTHRODESIS LEVEL 4 N/A 05/18/2020   Procedure: THORACIC FOUR-THORACIC EIGHT POSTERIOR LATERAL ARTHRODESIS;  Surgeon: [name], MD;  Location: [place];  Service: Neurosurgery;  Laterality: N/A;  . POCKET REVISION/RELOCATION N/A 09/17/2019   Procedure: POCKET REVISION/RELOCATION;  Surgeon: Thompson Grayer, MD;  Location: [place] CV LAB;  Service: Cardiovascular;  Laterality: N/A;  . SUBMANDIBULAR GLAND EXCISION N/A 08/08/2019   Procedure: suture of oral wounds;  Surgeon: [name], [name];  Location: [facility] ORS;  Service: Oral Surgery;  Laterality: N/A;  . TEE WITHOUT CARDIOVERSION  09/24/2012   Procedure: TRANSESOPHAGEAL ECHOCARDIOGRAM (TEE);  Surgeon: [name], MD;  Location: [place];  Service: Cardiovascular;  Laterality: N/A;  dave/anesth, dl, cindy/echo   . TEE WITHOUT CARDIOVERSION N/A 11/12/2019   Procedure: TRANSESOPHAGEAL ECHOCARDIOGRAM (TEE);  Surgeon: [name], MD;  Location: [facility] ENDOSCOPY;  Service: Cardiovascular;  Laterality: N/A;  . TEE WITHOUT CARDIOVERSION N/A 11/13/2019   Procedure: TRANSESOPHAGEAL ECHOCARDIOGRAM (TEE);  Surgeon: [name], MD;  Location: [place];   Service: Cardiovascular;  Laterality: N/A;  . THORACIC LAMINECTOMY FOR EPIDURAL ABSCESS N/A 05/08/2020   Procedure: THORACIC LAMINECTOMY FOR HEMATOMA EVACUATION;  Surgeon: [name], MD;  Location: [place];  Service: Neurosurgery;  Laterality: N/A;    Family History  Problem Relation Age of Onset  . Arthritis Mother   . Heart disease Mother   . Heart attack Mother   . Other Father        smoker  . Hypertension Neg Hx        unknown  . Stroke Neg Hx        unknown   Social History:  reports that he quit smoking about 11 years ago. His smoking use included pipe. He quit smokeless tobacco use about 11 years ago. He reports that he does  not drink alcohol or use drugs.  Allergies: No Known Allergies  Medications:                                                                                                                        I reviewed home medications   ROS:                                                                                                                                     Unable to review systems due to patient's mental status   Examination:                                                                                                      General: Appears well-developed and well-nourished.  Psych: Unable to assess Eyes: No scleral injection HENT: No OP obstrucion Head: Normocephalic.  Cardiovascular: Normal rate and regular rhythm.  Respiratory: Breathing with assistance of ventilator GI: Soft.  No distension. There is no tenderness.  Skin: WDI    Neurological Examination Mental Status: Patient does not respond to verbal stimuli.  Does not respond to deep sternal rub.  Does not follow commands.  No verbalizations noted.  Cranial Nerves: II: Pupils are equal and reactive III,IV,VI: doll's response present V,VII: corneal reflex: Present VIII: patient does not respond to verbal stimuli IX,X: gag reflex present XI: trapezius strength unable to test  bilaterally XII: tongue strength unable to test Motor: Extremities flaccid throughout.  No spontaneous movement noted.   Sensory: Does not respond to noxious stimuli in any extremity. Deep Tendon Reflexes:  Absent throughout. Plantars: Mute Cerebellar: Unable to perform     Lab Results: Basic Metabolic Panel: Recent Labs  Lab 05/14/20 0159 05/14/20 0336 05/14/20 0654 05/01/2020 1758 05/17/20 0333 05/20/20 0141 05/20/20 1440 05/20/20 1618  NA 140 141  --  145 140 148*  --   --   K 3.8 3.6  --  4.4 4.3 5.1  --   --   CL  --   --   --   --  111  --   --   --   CO2  --   --   --   --  21*  --   --   --   GLUCOSE  --   --   --   --  166*  --   --   --   BUN  --   --   --   --  24*  --   --   --   CREATININE  --   --   --   --  1.52*  --   --   --   CALCIUM  --   --   --   --  8.1*  --   --   --   MG  --   --  1.5*  --   --   --  1.8 2.0  PHOS  --   --  4.1  --   --   --  4.2 4.3    CBC: Recent Labs  Lab 05/15/20 1717 05/15/20 1717 04/29/2020 0201 05/01/2020 0201 05/10/2020 0958 05/15/2020 1758 05/07/2020 2101 05/17/20 0333 05/20/20 0141  WBC 7.8  --  8.0  --  7.9  --  6.1 6.1  --   NEUTROABS  --   --   --   --   --   --   --  5.5  --   HGB 8.5*   < > 8.4*   < > 8.7* 9.2* 8.8* 8.8* 13.6  HCT 28.2*   < > 27.8*   < > 28.3* 27.0* 27.8* 27.0* 40.0  MCV 92.2  --  93.0  --  93.1  --  91.7 89.4  --   PLT 207  --  208  --  202  --  179 188  --    < > = values in this interval not displayed.    Coagulation Studies: No results for input(s): LABPROT, INR in the last 72 hours.  Imaging: CT HEAD WO CONTRAST  Result Date: 05/20/2020 CLINICAL DATA:  Recent fall. Spine fracture. Question intracranial hemorrhage. EXAM: CT HEAD WITHOUT CONTRAST TECHNIQUE: Contiguous axial images were obtained from the base of the skull through the vertex without intravenous contrast. COMPARISON:  CT head without contrast 04/24/2020 FINDINGS: Brain: Remote infarct of the left posterior insular cortex and  left superior medial temporal lobe is stable. No acute infarct, hemorrhage, or mass lesion is present. Atrophy and white matter disease is stable. The ventricles are proportionate to the degree of atrophy. No significant extraaxial fluid collection is present. The brainstem and cerebellum are within normal limits. Vascular: Extensive vascular calcifications are present within the cavernous internal carotid arteries and extending into the left MCA. No hyperdense vessel is present. Skull: Calvarium is intact. No focal lytic or blastic lesions are present. No significant extracranial soft tissue lesion is present. Sinuses/Orbits: Chronic mucosal thickening is present in the right maxillary sinus. There is some chronic wall thickening of the left maxillary sinus. The paranasal sinuses and mastoid air cells are otherwise clear. The globes and orbits are within normal limits. IMPRESSION: 1. Stable remote infarct of the left posterior insular cortex and left superior medial temporal lobe. 2. Stable atrophy and white matter disease. 3. No acute intracranial abnormality or significant interval change. Electronically Signed   By: San Morelle M.D.   On: 05/20/2020 09:15   Portable Chest x-ray  Result Date: 05/20/2020 CLINICAL DATA:  Endotracheal tube present. EXAM: PORTABLE CHEST 1 VIEW  COMPARISON:  Radiograph 05/18/2020 FINDINGS: Endotracheal tube tip at the thoracic inlet. Left internal jugular central venous catheter in place, tip obscured by overlying spinal hardware. Enteric tube is no longer visualized. Prior median sternotomy. Stable cardiomegaly. Unchanged mediastinal contours. Aortic atherosclerosis. Minimal improvement in the patchy right perihilar opacity, worsening opacities in the left mid lower lung zones. Small pleural effusions. No pneumothorax. IMPRESSION: 1. Bilateral airspace opacities. This is worsened on the left and slightly improved on the right from prior exam. Favor pneumonia over  pulmonary edema. 2. Stable cardiomegaly.  Small pleural effusions. 3. Enteric tube is no longer seen. Endotracheal tube and left central line remain in place. Electronically Signed   By: Keith Rake M.D.   On: 05/20/2020 01:07   DG Abd Portable 1V  Result Date: 05/18/2020 CLINICAL DATA:  Enteric catheter placement EXAM: PORTABLE ABDOMEN - 1 VIEW COMPARISON:  04/23/2020 FINDINGS: Frontal view of the lower chest and upper abdomen demonstrates enteric catheter coiled back upon itself overlying the region of the distal thoracic esophagus. Bowel gas pattern is unremarkable. Small bilateral pleural effusions are noted. IMPRESSION: 1. Enteric catheter coiled back upon itself overlying the distal thoracic esophagus. Recommend removal and replacement. These results will be called to the ordering clinician or representative by the Radiologist Assistant, and communication documented in the PACS or [employer]. Electronically Signed   By: Randa Ngo M.D.   On: 05/18/2020 19:35     I have reviewed the above imaging: CT head   ASSESSMENT AND PLAN    74 y.o. 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  Patient status post laminectomy and hematoma evacuation-successfully extubated and moving all 4 extremities against gravity.  On 5/27 patient became more somnolent and had agonal breathing requiring intubation.  CT head shows no acute findings.  Stat EEG: No seizures  Recommendations Continue long-term EEG Continue Keppra, Dilantin and valproic acid (Dilantin level therapeutic, VPA level low-can be increased if patient has seizures) Check ammonia level, CBC,  electrolytes-evaluate for uremic encephalopathy versus hyperammonemia Consider MRI brain after ruling out subclinical seizures    CRITICAL CARE Performed by: Lanice Schwab Yulanda Diggs   Total critical care time: 45  minutes  Critical care time was exclusive of separately billable procedures and treating other patients.  Critical care was necessary to treat or prevent imminent or life-threatening deterioration.  Critical care was time spent personally by me on the following activities: development of treatment plan with patient and/or surrogate as well as nursing, discussions with consultants, evaluation of patient's response to treatment, examination of patient, obtaining history from patient or surrogate, ordering and performing treatments and interventions, ordering and review of laboratory studies, ordering and review of radiographic studies, pulse oximetry and re-evaluation of patient's condition.  Panagiotis Oelkers [facility] Pager Number [phone]

## 2020-05-20 NOTE — Progress Notes (Signed)
Pharmacy Antibiotic Note  [facility] Louis Ford. is a 74 y.o. male admitted on 05/21/2020 with asp pna.  Pharmacy has been consulted for Zosyn dosing.  Plan: Zosyn 3.375gm IV q8h Will f/u micro data, renal function, and pt's clinical condition  Height: 6\' 2"  (188 cm)(Measured by RT) Weight: 87 kg (191 lb 12.8 oz) IBW/kg (Calculated) : 82.2  Temp (24hrs), Avg:98.6 F (37 C), Min:97.6 F (36.4 C), Max:99.3 F (37.4 C)  Recent Labs  Lab 04/24/2020 1531 05/21/2020 1551 05/14/20 0654 05/15/20 1717 05/15/2020 0201 05/19/2020 0958 04/30/2020 2101 05/17/20 0333  WBC 3.5*  --    < > 7.8 8.0 7.9 6.1 6.1  CREATININE 1.51* 1.40*  --   --   --   --   --  1.52*   < > = values in this interval not displayed.    Estimated Creatinine Clearance: 51.8 mL/min (A) (by C-G formula based on SCr of 1.52 mg/dL (H)).    No Known Allergies  Antimicrobials this admission: 5/28 Zosyn >>   Microbiology results: 5/28 Trach asp: 5/23 MRSA PCR: neg 5/21 Ucx: Serratia marcescens - no urinary symptoms 5/21 COVID: neg  Thank you for allowing pharmacy to be a part of this patient's care.  Sherlon Handing, PharmD, BCPS Please see amion for complete clinical pharmacist phone list 05/20/2020 12:53 AM

## 2020-05-21 ENCOUNTER — Inpatient Hospital Stay (HOSPITAL_COMMUNITY): Payer: Medicare Other

## 2020-05-21 DIAGNOSIS — D699 Hemorrhagic condition, unspecified: Secondary | ICD-10-CM

## 2020-05-21 DIAGNOSIS — K7200 Acute and subacute hepatic failure without coma: Secondary | ICD-10-CM

## 2020-05-21 LAB — COMPREHENSIVE METABOLIC PANEL
ALT: 678 U/L — ABNORMAL HIGH (ref 0–44)
AST: 1579 U/L — ABNORMAL HIGH (ref 15–41)
Albumin: 2 g/dL — ABNORMAL LOW (ref 3.5–5.0)
Alkaline Phosphatase: 99 U/L (ref 38–126)
Anion gap: 8 (ref 5–15)
BUN: 52 mg/dL — ABNORMAL HIGH (ref 8–23)
CO2: 21 mmol/L — ABNORMAL LOW (ref 22–32)
Calcium: 7.5 mg/dL — ABNORMAL LOW (ref 8.9–10.3)
Chloride: 119 mmol/L — ABNORMAL HIGH (ref 98–111)
Creatinine, Ser: 2.36 mg/dL — ABNORMAL HIGH (ref 0.61–1.24)
GFR calc Af Amer: 31 mL/min — ABNORMAL LOW (ref >60)
GFR calc non Af Amer: 27 mL/min — ABNORMAL LOW (ref >60)
Glucose, Bld: 194 mg/dL — ABNORMAL HIGH (ref 70–99)
Potassium: 3.7 mmol/L (ref 3.5–5.1)
Sodium: 148 mmol/L — ABNORMAL HIGH (ref 135–145)
Total Bilirubin: 4.1 mg/dL — ABNORMAL HIGH (ref 0.3–1.2)
Total Protein: 8.3 g/dL — ABNORMAL HIGH (ref 6.5–8.1)

## 2020-05-21 LAB — CBC WITH DIFFERENTIAL/PLATELET
Abs Immature Granulocytes: 0.24 10*3/uL — ABNORMAL HIGH (ref 0.00–0.07)
Basophils Absolute: 0 10*3/uL (ref 0.0–0.1)
Basophils Relative: 0 %
Eosinophils Absolute: 0.1 10*3/uL (ref 0.0–0.5)
Eosinophils Relative: 1 %
HCT: 29.5 % — ABNORMAL LOW (ref 39.0–52.0)
Hemoglobin: 9.1 g/dL — ABNORMAL LOW (ref 13.0–17.0)
Immature Granulocytes: 3 %
Lymphocytes Relative: 9 %
Lymphs Abs: 0.7 10*3/uL (ref 0.7–4.0)
MCH: 28.9 pg (ref 26.0–34.0)
MCHC: 30.8 g/dL (ref 30.0–36.0)
MCV: 93.7 fL (ref 80.0–100.0)
Monocytes Absolute: 1.1 10*3/uL — ABNORMAL HIGH (ref 0.1–1.0)
Monocytes Relative: 13 %
Neutro Abs: 6.5 10*3/uL (ref 1.7–7.7)
Neutrophils Relative %: 74 %
Platelets: 107 10*3/uL — ABNORMAL LOW (ref 150–400)
RBC: 3.15 MIL/uL — ABNORMAL LOW (ref 4.22–5.81)
RDW: 20.5 % — ABNORMAL HIGH (ref 11.5–15.5)
WBC: 8.6 10*3/uL (ref 4.0–10.5)
nRBC: 11.7 % — ABNORMAL HIGH (ref 0.0–0.2)

## 2020-05-21 LAB — GLUCOSE, CAPILLARY
Glucose-Capillary: 156 mg/dL — ABNORMAL HIGH (ref 70–99)
Glucose-Capillary: 154 mg/dL — ABNORMAL HIGH (ref 70–99)
Glucose-Capillary: 139 mg/dL — ABNORMAL HIGH (ref 70–99)
Glucose-Capillary: 158 mg/dL — ABNORMAL HIGH (ref 70–99)
Glucose-Capillary: 148 mg/dL — ABNORMAL HIGH (ref 70–99)

## 2020-05-21 LAB — MAGNESIUM
Magnesium: 1.9 mg/dL (ref 1.7–2.4)
Magnesium: 2 mg/dL (ref 1.7–2.4)

## 2020-05-21 LAB — AMMONIA: Ammonia: 64 umol/L — ABNORMAL HIGH (ref 9–35)

## 2020-05-21 LAB — PHOSPHORUS
Phosphorus: 2.1 mg/dL — ABNORMAL LOW (ref 2.5–4.6)
Phosphorus: 1.7 mg/dL — ABNORMAL LOW (ref 2.5–4.6)

## 2020-05-21 LAB — TRIGLYCERIDES: Triglycerides: 72 mg/dL (ref <150)

## 2020-05-21 MED ORDER — FREE WATER
200.0000 mL | Freq: Three times a day (TID) | Status: DC
  Administered 2020-05-21 – 2020-05-22 (×3): 200 mL

## 2020-05-21 MED ORDER — RIFAXIMIN 550 MG PO TABS
550.0000 mg | ORAL_TABLET | Freq: Two times a day (BID) | ORAL | Status: DC
  Administered 2020-05-21 – 2020-06-02 (×24): 550 mg
  Filled 2020-05-21 (×24): qty 1

## 2020-05-21 MED ORDER — AMIODARONE HCL 200 MG PO TABS
200.0000 mg | ORAL_TABLET | Freq: Every day | ORAL | Status: DC
  Administered 2020-05-22 – 2020-06-02 (×12): 200 mg
  Filled 2020-05-21 (×12): qty 1

## 2020-05-21 NOTE — Progress Notes (Addendum)
  NEUROSURGERY PROGRESS NOTE   No issues overnight. Remains intubated.  EXAM:  BP 90/73   Pulse (!) 59   Temp 99.2 F (37.3 C) (Axillary)   Resp (!) 24   Ht 6\' 2"  (1.88 m) Comment: Measured by RT  Wt 97.7 kg   SpO2 100%   BMI 27.65 kg/m   Occasional spontaneous eye opening Left pupil 65mm, reactive. Right 66mm sluggishly reactive Breathing over vent No motor responses to central pain  IMPRESSION:  74 y.o. male s/p thoracic laminectomy/stabilization for hematoma and myeloma with severe hyperammoniemia with associated encephalopathy  PLAN: - Cont supportive care per PCCM/Neurology

## 2020-05-21 NOTE — Progress Notes (Signed)
Oncology Short Note  Patient was reintubated on 5/28. Had extensive discussion with wife/HPOA at bedside on 5/28. Plz see previous addended note for details -discussed adverse prognosis and goals of care and rx limitation with his extensive medical co-morbidities and difficulities with maintaining medical f/u. -if patient medically stabile over the weekend will need completion of myeloma w/u with whole body bone survey and BM Bx next week -would not be a candidate for aggressive myeloma rx -- would start Ninlaro weekly/low dose Dx -Bone directed therapies after 2-3 weeks post spinal surgery to avoid healing delays. -RT depending on needs for symptom control  Joclynn Lumb

## 2020-05-21 NOTE — Procedures (Addendum)
Patient Name: Louis Ford.  MRN: [id number]  Epilepsy Attending: Lora Havens  Referring Physician/Provider: Dr Karena Addison Aroor Duration: 05/20/2020 1551 to 05/21/2020 1754  Patient history: 74 y.o. 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 On 5/27 patient became more somnolent and had agonal breathing requiring intubation. EEG to evaluate for seizure.   Level of alertness: Comatose  AEDs during EEG study: Keppra, PHT, VPA  Technical aspects: This EEG study was done with scalp electrodes positioned according to the 10-20 International system of electrode placement. Electrical activity was acquired at a sampling rate of 500Hz  and reviewed with a high frequency filter of 70Hz  and a low frequency filter of 1Hz . EEG data were recorded continuously and digitally stored.   Description: EEG showed continuous generalized polymorphic 3-5Hz  theta-delta slowing. Sharp waves were seen in right anterior temporal region, maximal F8-T8. Hyperventilation and photic stimulation were not performed.     Of note, parts of study were difficult to interpret due to significant electrode artifact.  ABNORMALITY -Sharp waves, right anterior temporal region, maximal F8-T8 -Continuous slow, generalized  IMPRESSION: This study showed evidence of epileptogenicity arising from the right anterior temporal region.  Additionally, there is evidence of severe diffuse encephalopathy, nonspecific etiology.  No seizures were seen during the study.  Louis Ford [name]

## 2020-05-21 NOTE — Progress Notes (Addendum)
NAME:  Louis Dahir., MRN:  [id number], DOB:  03-Oct-1946, LOS: 8 ADMISSION DATE:  05/20/2020, CONSULTATIO care unit N DATE:  05/21/20 REFERRING MD:  Neurosurgery, CHIEF COMPLAINT:   Fall  Brief History   74 year old male who presented to the emergency department 5/21 after a fall while walking to the bathroom was found to have pathologic compression fracture of T6 and large volume spinal epidural hemorrhage requiring hematoma evacuation and thoracic laminectomy.  He was intubated for procedure and transferred to the intensive care unit postop.  History of present illness   74 year old male with a history of hypertension, hyperlipidemia, CAD s/p CABG 2011, ischemic cardiomyopathy HFrEF, AICD, atrial fibrillation, CVA, seizure disorder, factor VII deficiency, not clearly diagnosed bleeding diathesis despite extensive work-up who was walking to the bathroom with his walker when he lost his balance and fell to seated position after his legs just gave out on him.  He was having significant pain on presentation to the ED and was found to have pathologic compression fracture of T6  large volume of Spinal Epidural Hemorrhage spanning from the cervical spine throughout the thoracic spine to the conus.  Widespread spinal cord mass-effect.  Neurosurgery and hematology evaluated, recommendation for 3 units FFP, 1 unit of platelets and 1 g of TXA  preprocedure, 1 unit platelets, 1 g TXA post procedure with additional FFP depending on volume status.  Neurosurgery performed T6 laminectomy and epidural clot evacuation.  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   Significant Hospital Events   5/21 admit to neurosurgery 5/22 to ICU postop 5/24 - OR for T4-T8 arthrodesis. 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 5/28 IMPRESSION: 1. Stable remote infarct of the left posterior insular cortex and left superior medial temporal lobe. 2. Stable atrophy and white matter disease. 3. No acute intracranial abnormality or significant interval change.  Micro Data:  5/21 MRSA screen>> negative 5/21 SARS-Cov-2>> negative 5/21 urine culture>>   Antimicrobials:  Ceftriaxone 5/21 Zosyn 5/27>>  Interim history/subjective:  Encephalopathic Not following commands EEG monitoring in place   Objective   Blood pressure 90/73, pulse (!) 59,  temperature 99.2 F (37.3 C), temperature source Axillary, resp. rate (!) 24, height _0  (1.88 m), weight 97.7 kg, SpO2  100 %.    Vent Mode: PRVC FiO2 (%):  [40 %] 40 % Set Rate:  [18 bmp] 18 bmp Vt Set:  [650 mL] 650 mL PEEP:  [5 cmH20] 5 cmH20 Plateau Pressure:  [18 cmH20-27 cmH20] 24 cmH20   Intake/Output Summary (Last 24 hours) at 05/21/2020 0933 Last data filed at 05/21/2020 0700 Gross per 24 hour  Intake 2206.33 ml  Output 1470 ml  Net 736.33 ml   Filed Weights   05/14/20 1200 04/25/2020 1436 05/21/20 0500  Weight: 87 kg 87 kg 97.7 kg    General: On vent, does appear comfortable HEENT: senile arcus, no icterus, MM pink/moist, ETT in place. Neuro: Spontaneous movement but does not respond to verbal commands  CV: s1s2 RRR, no m/r/g. Prior sternotomy PULM: Clear breath sounds bilaterally GI: soft, bsx4 active  Extremities: warm/dry, no edema   Resolved Hospital Problem list     Assessment & Plan:    Critically ill due to respiratory failure requiring mechanical ventilation. -Continue mechanical ventilation  Target TVol 6-8cc/kgIBW Target Plateau Pressure < 30cm H20 Target driving pressure less than 15 cm of water Ventilator associated pneumonia prevention protocol  Toxic metabolic encephalopathy which is improving.  No seizures on EEG -Minimize sedative medications. -Elevated ammonia level -Allow time for hypoactive delirium to clear. -CT head-no hemorrhage  Hyperammonemia -Continue to trend -Continue lactulose -Continue Xifaxan  Hypernatremia -Add free water  Fall with pathologic T6 fracture and epidural hematoma causing cord compression.  Status post T6 laminectomy and spine stabilization 5/25. Myeloma related with IgG Kappa spike - Oncology is seeing patient.  Bleeding diathesis, felt to be related to paraprotein -Worked up by Dr. Irene Limbo and at [facility] by [name] -Work-up for possible multiple myeloma per hematology -pathology pending -Consider TXA  infusion if requires further OR -Empirically transfuse FFP. -Consider tag to better define clotting disorder.  History of seizure disorder Sound like both tonic-clonic, followed by neurology  HFrEF, CAD s/p CABG, AICD, atrial fibrillation, hypertension, hyperlipidemia Last echocardiogram in 03/2020 with EF of 30 to 35%, not chronically anti-coagulated given bleeding disorder -Resume Coreg -Amiodarone dose decreased -Continue Lasix, monitor volume status and I&O's  Daily Goals Checklist  Pain/Anxiety/Delirium protocol (if indicated): stopped all except oxycodone. Keep off sedative infusions if possible Neuro vitals: every 2 hours AED's: Continue home AED's VAP protocol (if indicated): bundle in place, chest PT Respiratory support goals: Full support. SBT once mental status improves. Blood pressure target: Allow autoregulation. DVT prophylaxis: SCD's only due to bleeding diathesis.  Nutrition Status: Cortrak in place GI prophylaxis: pantoprazole Fluid status goals: allow autoregulation.  Urinary catheter: Condom Central lines: L IJ CVC Glucose control: adequate control of T2DM with SSI. Mobility/therapy needs: Bedrest then PT once extubated.  Antibiotic de-escalation: Zosyn for 7 days to cover HCAP Home medication reconciliation:  Home AED, HF meds on hold Daily labs: CBC, BMP Code Status: Full  Family Communication: per Neurosurgery Disposition: ICU    Labs   CBC: Recent Labs  Lab 05/21/2020 0958 05/06/2020 1758 05/09/2020 2101 05/17/20 0333 05/20/20 0141 05/20/20 1741 05/21/20 0538  WBC 7.9  --  6.1 6.1  --  9.4 8.6  NEUTROABS  --   --   --  5.5  --   --  6.5  HGB 8.7*   < > 8.8* 8.8* 13.6 9.4* 9.1*  HCT 28.3*   < > 27.8* 27.0* 40.0 29.9* 29.5*  MCV 93.1  --  91.7 89.4  --  94.0 93.7  PLT 202  --  179 188  --  104* 107*   < > = values in this interval not displayed.    Basic Metabolic Panel: Recent Labs  Lab 04/25/2020 1758 05/17/20 0333 05/20/20 0141  05/20/20 1440 05/20/20 1618 05/20/20 1741 05/21/20 0538  NA 145 140 148*  --   --  146* 148*  K 4.4 4.3 5.1  --   --  5.5* 3.7  CL  --  111  --   --   --  117* 119*  CO2  --  21*  --   --   --  16* 21*  GLUCOSE  --  166*  --   --   --  160* 194*  BUN  --  24*  --   --   --  49* 52*  CREATININE  --  1.52*  --   --   --  2.67* 2.36*  CALCIUM  --  8.1*  --   --   --  7.8* 7.5*  MG  --   --   --  1.8 2.0  --  1.9  PHOS  --   --   --  4.2 4.3  --  2.1*   The patient is critically ill with multiple organ systems failure and requires high complexity decision making for assessment and support, frequent evaluation and titration of therapies, application of advanced monitoring technologies and extensive interpretation of multiple databases. Critical Care Time devoted to patient care services described in this note independent of APP/resident time (if applicable)  is 30 minutes.   Sherrilyn Rist MD [facility] Personal pager: [phone] If unanswered, please page CCM On-call: [phone]

## 2020-05-21 NOTE — Progress Notes (Signed)
LTM EEG discontinued - no skin breakdown at unhook.   

## 2020-05-21 NOTE — Progress Notes (Signed)
eLink Physician-Brief Progress Note Patient Name: [facility]. DOB: 04-16-1946 MRN: [id number]   Date of Service  05/21/2020  HPI/Events of Note  Request for flexiseal. Patient on lactulose  eICU Interventions  Flexiseal ordered. Lactulose will need to be held     Intervention Category Intermediate Interventions: Other:  Judd Lien 05/21/2020, 11:10 PM

## 2020-05-21 NOTE — Progress Notes (Signed)
Reason for consult: Profound encephalopathy  Subjective: Patient now opens eyes to sternal rub.  But does not follow any commands or moves purposefully   ROS: Unable to obtain due to poor mental status  Examination  Vital signs in last 24 hours: Temp:  [97.9 F (36.6 C)-101 F (38.3 C)] 99.2 F (37.3 C) (05/29 0800) Pulse Rate:  [26-139] 59 (05/29 0800) Resp:  [17-47] 24 (05/29 0800) BP: (82-125)/(65-107) 90/73 (05/29 0700) SpO2:  [95 %-100 %] 100 % (05/29 0800) FiO2 (%):  [40 %] 40 % (05/29 0747) Weight:  [97.7 kg] 97.7 kg (05/29 0500)  General: lying in bed CVS: pulse-normal rate and rhythm RS: breathing comfortably Extremities: normal   Neuro: Mental Status: Patient opens eyes to sternal rub Cranial Nerves: II: Pupils are equal and reactive bilaterally III,IV,VI: Disconjugate gaze, doll's reflex pressent V,VII: corneal reflex: Present VIII: patient does not respond to verbal stimuli IX,X: gag reflex: Present XI: trapezius strength unable to test bilaterally XII: tongue strength unable to test Motor: Extremities flaccid throughout.  No spontaneous movement noted.  No purposeful movements noted.  Sensory: Does not respond to noxious stimuli in any extremity. Plantars: Extensors bilaterally Cerebellar: Unable to perform  Basic Metabolic Panel: Recent Labs  Lab 04/30/2020 1758 05/17/20 0333 05/20/20 0141 05/20/20 1440 05/20/20 1618 05/20/20 1741 05/21/20 0538  NA 145 140 148*  --   --  146* 148*  K 4.4 4.3 5.1  --   --  5.5* 3.7  CL  --  111  --   --   --  117* 119*  CO2  --  21*  --   --   --  16* 21*  GLUCOSE  --  166*  --   --   --  160* 194*  BUN  --  24*  --   --   --  49* 52*  CREATININE  --  1.52*  --   --   --  2.67* 2.36*  CALCIUM  --  8.1*  --   --   --  7.8* 7.5*  MG  --   --   --  1.8 2.0  --  1.9  PHOS  --   --   --  4.2 4.3  --  2.1*    CBC: Recent Labs  Lab 05/01/2020 0958 05/12/2020 1758 04/24/2020 2101 05/17/20 0333 05/20/20 0141  05/20/20 1741 05/21/20 0538  WBC 7.9  --  6.1 6.1  --  9.4 8.6  NEUTROABS  --   --   --  5.5  --   --  6.5  HGB 8.7*   < > 8.8* 8.8* 13.6 9.4* 9.1*  HCT 28.3*   < > 27.8* 27.0* 40.0 29.9* 29.5*  MCV 93.1  --  91.7 89.4  --  94.0 93.7  PLT 202  --  179 188  --  104* 107*   < > = values in this interval not displayed.     Coagulation Studies: No results for input(s): LABPROT, INR in the last 72 hours.  Imaging Reviewed:     ASSESSMENT AND PLAN    74 y.o. 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  Patient status post laminectomy and hematoma evacuation-successfully extubated and moving all 4  extremities against gravity.  On 5/27 patient became more somnolent and had agonal breathing requiring intubation.  CT head shows no acute findings.  Stat EEG: No seizures Ammonia level 111  Impression Hyperammonemia with hepatic coma History of seizures on multiple AEDs-EEG negative for seizures/nonconvulsive status epilepticus Acute toxic metabolic encephalopathy Multiple myeloma Factor VII deficiency T6 compression fracture with epidural hematoma Atrial fibrillation   Recommendations Continue lactulose and monitor ammonia levels DC continous long-term EEG Continue Keppra, Dilantin  Hold Depakote due to elevated ammonia  MRI brain without contrast Seizure precautions Frequent neurochecks  CRITICAL CARE Performed by: Lanice Schwab Ellasyn Swilling   Total critical care time: 35  minutes  Critical care time was exclusive of separately billable procedures and treating other patients.  Critical care was necessary to treat or prevent imminent or life-threatening  deterioration.  Critical care was time spent personally by me on the following activities: development of treatment plan with patient and/or surrogate as well as nursing, discussions with consultants, evaluation of patient's response to treatment, examination of patient, obtaining history from patient or surrogate, ordering and performing treatments and interventions, ordering and review of laboratory studies, ordering and review of radiographic studies, pulse oximetry and re-evaluation of patient's condition.  Karena Addison Toba Claudio [facility] Pager Number [phone] For questions after 7pm please refer to AMION to reach the Neurologist on call

## 2020-05-22 ENCOUNTER — Inpatient Hospital Stay (HOSPITAL_COMMUNITY): Payer: Medicare Other

## 2020-05-22 DIAGNOSIS — D699 Hemorrhagic condition, unspecified: Secondary | ICD-10-CM

## 2020-05-22 LAB — CULTURE, RESPIRATORY W GRAM STAIN

## 2020-05-22 LAB — CBC WITH DIFFERENTIAL/PLATELET
Abs Immature Granulocytes: 0.49 10*3/uL — ABNORMAL HIGH (ref 0.00–0.07)
Basophils Absolute: 0 10*3/uL (ref 0.0–0.1)
Basophils Relative: 0 %
Eosinophils Absolute: 0.1 10*3/uL (ref 0.0–0.5)
Eosinophils Relative: 1 %
HCT: 28.4 % — ABNORMAL LOW (ref 39.0–52.0)
Hemoglobin: 8.9 g/dL — ABNORMAL LOW (ref 13.0–17.0)
Immature Granulocytes: 4 %
Lymphocytes Relative: 10 %
Lymphs Abs: 1.2 10*3/uL (ref 0.7–4.0)
MCH: 29.2 pg (ref 26.0–34.0)
MCHC: 31.3 g/dL (ref 30.0–36.0)
MCV: 93.1 fL (ref 80.0–100.0)
Monocytes Absolute: 1.5 10*3/uL — ABNORMAL HIGH (ref 0.1–1.0)
Monocytes Relative: 13 %
Neutro Abs: 7.9 10*3/uL — ABNORMAL HIGH (ref 1.7–7.7)
Neutrophils Relative %: 72 %
Platelets: 97 10*3/uL — ABNORMAL LOW (ref 150–400)
RBC: 3.05 MIL/uL — ABNORMAL LOW (ref 4.22–5.81)
RDW: 20.7 % — ABNORMAL HIGH (ref 11.5–15.5)
WBC: 11.2 10*3/uL — ABNORMAL HIGH (ref 4.0–10.5)
nRBC: 12.1 % — ABNORMAL HIGH (ref 0.0–0.2)

## 2020-05-22 LAB — COMPREHENSIVE METABOLIC PANEL
ALT: 655 U/L — ABNORMAL HIGH (ref 0–44)
AST: 1184 U/L — ABNORMAL HIGH (ref 15–41)
Albumin: 1.8 g/dL — ABNORMAL LOW (ref 3.5–5.0)
Alkaline Phosphatase: 137 U/L — ABNORMAL HIGH (ref 38–126)
Anion gap: 4 — ABNORMAL LOW (ref 5–15)
BUN: 55 mg/dL — ABNORMAL HIGH (ref 8–23)
CO2: 24 mmol/L (ref 22–32)
Calcium: 7.4 mg/dL — ABNORMAL LOW (ref 8.9–10.3)
Chloride: 124 mmol/L — ABNORMAL HIGH (ref 98–111)
Creatinine, Ser: 2.03 mg/dL — ABNORMAL HIGH (ref 0.61–1.24)
GFR calc Af Amer: 37 mL/min — ABNORMAL LOW (ref >60)
GFR calc non Af Amer: 32 mL/min — ABNORMAL LOW (ref >60)
Glucose, Bld: 190 mg/dL — ABNORMAL HIGH (ref 70–99)
Potassium: 3.5 mmol/L (ref 3.5–5.1)
Sodium: 152 mmol/L — ABNORMAL HIGH (ref 135–145)
Total Bilirubin: 5 mg/dL — ABNORMAL HIGH (ref 0.3–1.2)
Total Protein: 8.2 g/dL — ABNORMAL HIGH (ref 6.5–8.1)

## 2020-05-22 LAB — GLUCOSE, CAPILLARY
Glucose-Capillary: 122 mg/dL — ABNORMAL HIGH (ref 70–99)
Glucose-Capillary: 135 mg/dL — ABNORMAL HIGH (ref 70–99)
Glucose-Capillary: 144 mg/dL — ABNORMAL HIGH (ref 70–99)
Glucose-Capillary: 146 mg/dL — ABNORMAL HIGH (ref 70–99)
Glucose-Capillary: 177 mg/dL — ABNORMAL HIGH (ref 70–99)
Glucose-Capillary: 181 mg/dL — ABNORMAL HIGH (ref 70–99)
Glucose-Capillary: 209 mg/dL — ABNORMAL HIGH (ref 70–99)

## 2020-05-22 LAB — AMMONIA: Ammonia: 51 umol/L — ABNORMAL HIGH (ref 9–35)

## 2020-05-22 MED ORDER — DEXTROSE 5 % IV BOLUS
250.0000 mL | Freq: Once | INTRAVENOUS | Status: AC
  Administered 2020-05-22: 250 mL via INTRAVENOUS

## 2020-05-22 MED ORDER — DEXTROSE 5 % IV BOLUS: 1.0000 mL | Freq: Once | INTRAVENOUS | Status: DC

## 2020-05-22 MED ORDER — POTASSIUM PHOSPHATES 15 MMOLE/5ML IV SOLN
20.0000 mmol | Freq: Once | INTRAVENOUS | Status: AC
  Administered 2020-05-22: 20 mmol via INTRAVENOUS
  Filled 2020-05-22: qty 6.67

## 2020-05-22 MED ORDER — FREE WATER
300.0000 mL | Freq: Three times a day (TID) | Status: DC
  Administered 2020-05-22 – 2020-05-23 (×3): 300 mL

## 2020-05-22 NOTE — Progress Notes (Signed)
NAME:  Louis Ford., MRN:  [id number], DOB:  July 08, 1946, LOS: 9 ADMISSION DATE:  05/14/2020, CONSULTATIO care unit N DATE:  05/22/20 REFERRING MD:  Neurosurgery, CHIEF COMPLAINT:   Fall  Brief History   74 year old male who presented to the emergency department 5/21 after a fall while walking to the bathroom was found to have pathologic compression fracture of T6 and large volume spinal epidural hemorrhage requiring hematoma evacuation and thoracic laminectomy.  He was intubated for procedure and transferred to the intensive care unit postop.  History of present illness   74 year old male with a history of hypertension, hyperlipidemia, CAD s/p CABG 2011, ischemic cardiomyopathy HFrEF, AICD, atrial fibrillation, CVA, seizure disorder, factor VII deficiency, not clearly diagnosed bleeding diathesis despite extensive work-up who was walking to the bathroom with his walker when he lost his balance and fell to seated position after his legs just gave out on him.  He was having significant pain on presentation to the ED and was found to have pathologic compression fracture of T6  large volume of Spinal Epidural Hemorrhage spanning from the cervical spine throughout the thoracic spine to the conus.  Widespread spinal cord mass-effect.  Neurosurgery and hematology evaluated, recommendation for 3 units FFP, 1 unit of platelets and 1 g of TXA  preprocedure, 1 unit platelets, 1 g TXA post procedure with additional FFP depending on volume status.  Neurosurgery performed T6 laminectomy and epidural clot evacuation.  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   Significant Hospital Events   5/21 admit to neurosurgery 5/22 to ICU postop 5/24 - OR for T4-T8 arthrodesis. 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 5/28 IMPRESSION: 1. Stable remote infarct of the left posterior insular cortex and left superior medial temporal lobe. 2. Stable atrophy and white matter disease. 3. No acute intracranial abnormality or significant interval change.  Micro Data:  5/21 MRSA screen>> negative 5/21 SARS-Cov-2>> negative 5/21 urine culture>>   Antimicrobials:  Ceftriaxone 5/21 Zosyn 5/27>>  Interim history/subjective:  Encephalopathic Not following commands Remains unresponsive  Objective   Blood pressure 112/81, pulse (!) 136, temperature  100.3 F (37.9 C), temperature source Axillary, resp. rate (!) 22, height 6' 2"  (1.88 m), weight 97.7 kg, SpO2 100 %.  Vent Mode: PRVC FiO2 (%):  [40 %] 40 % Set Rate:  [16 bmp] 16 bmp Vt Set:  [650 mL] 650 mL PEEP:  [5 cmH20] 5 cmH20 Plateau Pressure:  [18 cmH20] 18 cmH20   Intake/Output Summary (Last 24 hours) at 05/22/2020 1352 Last data filed at 05/22/2020 1200 Gross per 24 hour  Intake 2910.32 ml  Output 2700 ml  Net 210.32 ml   Filed Weights   05/14/20 1200 05/21/2020 1436 05/21/20 0500  Weight: 87 kg 87 kg 97.7 kg    General: Comfortable HEENT: senile arcus, anicteric Neuro: Spontaneous movement but does not respond to verbal commands  CV: S1-S2 appreciated PULM: Clear breath sounds  Resolved Hospital Problem list     Assessment & Plan:    Critically ill due to respiratory failure requiring mechanical ventilation. -Continue mechanical ventilation  Target TVol 6-8cc/kgIBW Target Plateau Pressure < 30cm H20 Target driving pressure less than 15 cm of water Ventilator associated pneumonia prevention protocol  Toxic metabolic encephalopathy which is improving.  No seizures on EEG -Minimize sedative medications. -Elevated ammonia level-being corrected-51 -Allow time for hypoactive delirium to clear. -CT head-no hemorrhage  Hyperammonemia-currently 51 -Continue to trend -Continue lactulose -Continue Xifaxan  Hypernatremia -Add free water -We will increase free water -D5 water bolus  Fall with pathologic T6 fracture and epidural hematoma causing cord compression.  Status post T6 laminectomy and spine stabilization 5/25. Myeloma related with IgG Kappa spike - Oncology is seeing patient.  Bleeding diathesis, felt to be related to paraprotein -Worked up by Dr. Irene Limbo and at [facility] by [name] -Work-up for possible multiple myeloma per hematology -pathology pending -Consider TXA infusion if requires further OR -Empirically transfuse FFP. -Consider tag to better  define clotting disorder.  History of seizure disorder Sound like both tonic-clonic, followed by neurology  HFrEF, CAD s/p CABG, AICD, atrial fibrillation, hypertension, hyperlipidemia Last echocardiogram in 03/2020 with EF of 30 to 35%, not chronically anti-coagulated given bleeding disorder -Resume Coreg -Amiodarone dose decreased -Continue Lasix, monitor volume status and I&O's  Daily Goals Checklist  Pain/Anxiety/Delirium protocol (if indicated): stopped all except oxycodone. Keep off sedative infusions if possible Neuro vitals: every 2 hours AED's: Continue home AED's VAP protocol (if indicated): bundle in place, chest PT Respiratory support goals: Full support. SBT once mental status improves. Blood pressure target: Allow autoregulation. DVT prophylaxis: SCD's only due to bleeding diathesis.  Nutrition Status: Cortrak in place GI prophylaxis: pantoprazole Fluid status goals: allow autoregulation.  Urinary catheter: Condom Central lines: L IJ CVC Glucose control: adequate control of T2DM with SSI. Mobility/therapy needs: Bedrest then PT once extubated.  Antibiotic de-escalation: Zosyn for 7 days to cover HCAP Home medication reconciliation:  Home AED, HF meds on hold Daily labs: CBC, BMP Code Status: Full  Family Communication: per Neurosurgery Disposition: ICU    Labs   CBC: Recent Labs  Lab 05/11/2020 2101 05/15/2020 2101 05/17/20 0333 05/20/20 0141 05/20/20 1741 05/21/20 0538 05/22/20 0515  WBC 6.1  --  6.1  --  9.4 8.6 11.2*  NEUTROABS  --   --  5.5  --   --  6.5 7.9*  HGB 8.8*   < > 8.8* 13.6 9.4* 9.1* 8.9*  HCT 27.8*   < > 27.0* 40.0 29.9* 29.5* 28.4*  MCV 91.7  --  89.4  --  94.0 93.7 93.1  PLT 179  --  188  --  104* 107* 97*   < > = values in this interval not displayed.    Basic  Metabolic Panel: Recent Labs  Lab 05/17/20 0333 05/20/20 0141 05/20/20 1440 05/20/20 1618 05/20/20 1741 05/21/20 0538 05/21/20 1745 05/22/20 0515  NA 140 148*  --   --   146* 148*  --  152*  K 4.3 5.1  --   --  5.5* 3.7  --  3.5  CL 111  --   --   --  117* 119*  --  124*  CO2 21*  --   --   --  16* 21*  --  24  GLUCOSE 166*  --   --   --  160* 194*  --  190*  BUN 24*  --   --   --  49* 52*  --  55*  CREATININE 1.52*  --   --   --  2.67* 2.36*  --  2.03*  CALCIUM 8.1*  --   --   --  7.8* 7.5*  --  7.4*  MG  --   --  1.8 2.0  --  1.9 2.0  --   PHOS  --   --  4.2 4.3  --  2.1* 1.7*  --    The patient is critically ill with multiple organ systems failure and requires high complexity decision making for assessment and support, frequent evaluation and titration of therapies, application of advanced monitoring technologies and extensive interpretation of multiple databases. Critical Care Time devoted to patient care services described in this note independent of APP/resident time (if applicable)  is 32 minutes.   Sherrilyn Rist MD [place] [facility] Personal pager: [phone] If unanswered, please page CCM On-call: [phone]

## 2020-05-22 NOTE — Progress Notes (Signed)
Reason for consult: Encephalopathy   Subjective: Unresponsive on ventilator   ROS: Unable to obtain due to mental status  Examination  Vital signs in last 24 hours: Temp:  [99.1 F (37.3 C)-100.7 F (38.2 C)] 99.1 F (37.3 C) (05/30 0800) Pulse Rate:  [25-139] 54 (05/30 0930) Resp:  [13-54] 35 (05/30 0930) BP: (63-127)/(45-107) 96/71 (05/30 0930) SpO2:  [100 %] 100 % (05/30 0930) FiO2 (%):  [40 %] 40 % (05/30 0800)  General: lying in bed CVS: pulse-normal rate and rhythm RS: breathing comfortably with assistance of ventilator Extremities: normal   Neuro: MS: Opens eyes to noxious stimulus, CN: Pupils equal reactive, doll's present.  But disconjugate gaze.  Corneal present.  Cough reflex present. Motor: No spontaneous movement in all 4 extremities, no withdrawal MRI of the 25  Basic Metabolic Panel: Recent Labs  Lab 05/17/20 0333 05/17/20 0333 05/20/20 0141 05/20/20 1440 05/20/20 1618 05/20/20 1741 05/21/20 0538 05/21/20 1745 05/22/20 0515  NA 140  --  148*  --   --  146* 148*  --  152*  K 4.3  --  5.1  --   --  5.5* 3.7  --  3.5  CL 111  --   --   --   --  117* 119*  --  124*  CO2 21*  --   --   --   --  16* 21*  --  24  GLUCOSE 166*  --   --   --   --  160* 194*  --  190*  BUN 24*  --   --   --   --  49* 52*  --  55*  CREATININE 1.52*  --   --   --   --  2.67* 2.36*  --  2.03*  CALCIUM 8.1*   < >  --   --   --  7.8* 7.5*  --  7.4*  MG  --   --   --  1.8 2.0  --  1.9 2.0  --   PHOS  --   --   --  4.2 4.3  --  2.1* 1.7*  --    < > = values in this interval not displayed.    CBC: Recent Labs  Lab 05/12/2020 2101 05/19/2020 2101 05/17/20 0333 05/20/20 0141 05/20/20 1741 05/21/20 0538 05/22/20 0515  WBC 6.1  --  6.1  --  9.4 8.6 11.2*  NEUTROABS  --   --  5.5  --   --  6.5 7.9*  HGB 8.8*   < > 8.8* 13.6 9.4* 9.1* 8.9*  HCT 27.8*   < > 27.0* 40.0 29.9* 29.5* 28.4*  MCV 91.7  --  89.4  --  94.0 93.7 93.1  PLT 179  --  188  --  104* 107* 97*   < > = values  in this interval not displayed.     Coagulation Studies: No results for input(s): LABPROT, INR in the last 72 hours.  Imaging Reviewed:  MRI T and L-spine CT head from 5/28   ASSESSMENT AND PLAN  74 y.o.malewith past medical history significant for atrial fibrillation, CHF with AICD placement, coronary artery disease, hypertension, factor VII deficiency, prior stroke, epilepsy on 3 antiepileptic medications admitted on 5/21 after presenting with paraplegia due to epidural hematoma and T6 compression fracture due to bleeding diathesis/new diagnosis of multiple myeloma.Patient status post laminectomy and hematoma evacuation-successfully extubated and moving all 4 extremities against gravity.On 5/27patient became more somnolent and  had agonal breathing requiring intubation.  CT head shows no acute findings.24hr EEG:No seizures Ammonia level 111>64>>51  Patient remains comatose despite improvement of ammonia level and continues EEG not showing patient is in nonconvulsive status.  It is possible his clinical exam is lagging behind improvement in ammonia, however other processes need to be entertained.  We will need MRI brain and MRI C-spine to evaluate for alternative process such as stroke/meningitis/anoxic brain injury in the setting of hypoxia.  Impression Hyperammonemia with hepatic coma: Persistent, despite improvement of ammonia History of seizures on multiple AEDs-EEG negative for seizures/nonconvulsive status epilepticus Acute toxic metabolic encephalopathy Multiple myeloma Factor VII deficiency T6 compression fracture with epidural hematoma Atrial fibrillation   Recommendations Continue lactulose and monitor ammonia levels, liver enzymes Continue Keppra, Dilantin -repeat Dilantin levels Hold Depakote due to elevated ammonia  MRI brain without contrast and MRI C spine w/o contrast Seizure precautions Frequent neurochecks  CRITICAL CARE Performed by:  Lanice Schwab Aubrey Voong   Total critical care time: 35  minutes  Critical care time was exclusive of separately billable procedures and treating other patients.  Critical care was necessary to treat or prevent imminent or life-threatening deterioration.  Critical care was time spent personally by me on the following activities: development of treatment plan with patient and/or surrogate as well as nursing, discussions with consultants, evaluation of patient's response to treatment, examination of patient, obtaining history from patient or surrogate, ordering and performing treatments and interventions, ordering and review of laboratory studies, ordering and review of radiographic studies, pulse oximetry and re-evaluation of patient's condition.   Karena Addison Cordel Drewes [facility] Pager Number [phone] For questions after 7pm please refer to AMION to reach the Neurologist on call

## 2020-05-22 NOTE — Progress Notes (Signed)
Subjective: Patient reports unresponsive, on ventilator  Objective: Vital signs in last 24 hours: Temp:  [99.2 F (37.3 C)-100.7 F (38.2 C)] 99.5 F (37.5 C) (05/30 0400) Pulse Rate:  [25-139] 125 (05/30 0700) Resp:  [13-54] 32 (05/30 0700) BP: (63-127)/(45-107) 103/84 (05/30 0700) SpO2:  [100 %] 100 % (05/30 0700) FiO2 (%):  [40 %] 40 % (05/30 0316)  Intake/Output from previous day: 05/29 0701 - 05/30 0700 In: 2860.1 [I.V.:1004.8; NG/GT:1720; IV Piggyback:135.3] Out: 2630 [Urine:2330; Stool:300] Intake/Output this shift: No intake/output data recorded.  Physical Exam: No eye opening.  No withdrawal to noxious stim.  Intubated and on ventilator.  Lab Results: Recent Labs    05/21/20 0538 05/22/20 0515  WBC 8.6 11.2*  HGB 9.1* 8.9*  HCT 29.5* 28.4*  PLT 107* 97*   BMET Recent Labs    05/21/20 0538 05/22/20 0515  NA 148* 152*  K 3.7 3.5  CL 119* 124*  CO2 21* 24  GLUCOSE 194* 190*  BUN 52* 55*  CREATININE 2.36* 2.03*  CALCIUM 7.5* 7.4*    Studies/Results: CT HEAD WO CONTRAST  Result Date: 05/20/2020 CLINICAL DATA:  Recent fall. Spine fracture. Question intracranial hemorrhage. EXAM: CT HEAD WITHOUT CONTRAST TECHNIQUE: Contiguous axial images were obtained from the base of the skull through the vertex without intravenous contrast. COMPARISON:  CT head without contrast 05/23/2020 FINDINGS: Brain: Remote infarct of the left posterior insular cortex and left superior medial temporal lobe is stable. No acute infarct, hemorrhage, or mass lesion is present. Atrophy and white matter disease is stable. The ventricles are proportionate to the degree of atrophy. No significant extraaxial fluid collection is present. The brainstem and cerebellum are within normal limits. Vascular: Extensive vascular calcifications are present within the cavernous internal carotid arteries and extending into the left MCA. No hyperdense vessel is present. Skull: Calvarium is intact. No focal  lytic or blastic lesions are present. No significant extracranial soft tissue lesion is present. Sinuses/Orbits: Chronic mucosal thickening is present in the right maxillary sinus. There is some chronic wall thickening of the left maxillary sinus. The paranasal sinuses and mastoid air cells are otherwise clear. The globes and orbits are within normal limits. IMPRESSION: 1. Stable remote infarct of the left posterior insular cortex and left superior medial temporal lobe. 2. Stable atrophy and white matter disease. 3. No acute intracranial abnormality or significant interval change. Electronically Signed   By: San Morelle M.D.   On: 05/20/2020 09:15   DG Chest Port 1 View  Result Date: 05/21/2020 CLINICAL DATA:  Acute respiratory failure EXAM: PORTABLE CHEST 1 VIEW COMPARISON:  05/20/2020 FINDINGS: Endotracheal tube, partially imaged enteric tube, and left IJ central line identified. Persistent but improved bilateral patchy opacities. Decreased pleural effusions. No pneumothorax. Stable cardiomediastinal contours. IMPRESSION: Persistent but improved patchy bilateral opacities. Decreased pleural effusions. Electronically Signed   By: Macy Mis M.D.   On: 05/21/2020 08:34   Overnight EEG with video  Result Date: 05/21/2020 Lora Havens, MD     05/21/2020  9:54 AM Patient [facility]: [facility]. MRN: [id number] Epilepsy Attending: Lora Havens Referring Physician/Provider: Dr Karena Addison Aroor Duration: 05/20/2020 1551 to 05/21/2020 0900 Patient history: 74 y.o. 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 On 5/27 patient became more somnolent and had agonal breathing requiring intubation.  EEG to evaluate for seizure. Level of  alertness: Comatose AEDs during EEG study: Keppra, PHT, VPA Technical aspects: This EEG study was done with scalp electrodes positioned according to the 10-20 International system of electrode placement. Electrical activity was acquired at a sampling rate of 500Hz  and reviewed with a high frequency filter of 70Hz  and a low frequency filter of 1Hz . EEG data were recorded continuously and digitally stored. Description: EEG showed continuous generalized polymorphic 3-5Hz  theta-delta slowing. Sharp waves were seen in right anterior temporal region, maximal F8-T8. Hyperventilation and photic stimulation were not performed.   Of note, parts of study were difficult to interpret due to significant electrode artifact.  ABNORMALITY -Sharp waves, right anterior temporal region, maximal F8-T8 -Continuous slow, generalized  IMPRESSION: This study showed evidence of epileptogenicity arising from the right anterior temporal region.  Additionally, there is evidence of severe diffuse encephalopathy, nonspecific etiology.  No seizures were seen during the study. Priyanka [name]    Assessment/Plan: Patient remains encephalopathic and on ventilator.  Unable to assess neurologic status.  Continue care per CCM.    LOS: 9 days    Peggyann Shoals, MD 05/22/2020, 7:43 AM

## 2020-05-23 DIAGNOSIS — R4182 Altered mental status, unspecified: Secondary | ICD-10-CM

## 2020-05-23 LAB — CBC WITH DIFFERENTIAL/PLATELET
Abs Immature Granulocytes: 0.38 10*3/uL — ABNORMAL HIGH (ref 0.00–0.07)
Basophils Absolute: 0 10*3/uL (ref 0.0–0.1)
Basophils Relative: 0 %
Eosinophils Absolute: 0.1 10*3/uL (ref 0.0–0.5)
Eosinophils Relative: 1 %
HCT: 27.6 % — ABNORMAL LOW (ref 39.0–52.0)
Hemoglobin: 8.7 g/dL — ABNORMAL LOW (ref 13.0–17.0)
Immature Granulocytes: 3 %
Lymphocytes Relative: 11 %
Lymphs Abs: 1.3 10*3/uL (ref 0.7–4.0)
MCH: 29.6 pg (ref 26.0–34.0)
MCHC: 31.5 g/dL (ref 30.0–36.0)
MCV: 93.9 fL (ref 80.0–100.0)
Monocytes Absolute: 2.3 10*3/uL — ABNORMAL HIGH (ref 0.1–1.0)
Monocytes Relative: 18 %
Neutro Abs: 8.3 10*3/uL — ABNORMAL HIGH (ref 1.7–7.7)
Neutrophils Relative %: 67 %
Platelets: 96 10*3/uL — ABNORMAL LOW (ref 150–400)
RBC: 2.94 MIL/uL — ABNORMAL LOW (ref 4.22–5.81)
RDW: 22.1 % — ABNORMAL HIGH (ref 11.5–15.5)
WBC: 12.4 10*3/uL — ABNORMAL HIGH (ref 4.0–10.5)
nRBC: 14.8 % — ABNORMAL HIGH (ref 0.0–0.2)

## 2020-05-23 LAB — COMPREHENSIVE METABOLIC PANEL
ALT: 639 U/L — ABNORMAL HIGH (ref 0–44)
AST: 891 U/L — ABNORMAL HIGH (ref 15–41)
Albumin: 1.8 g/dL — ABNORMAL LOW (ref 3.5–5.0)
Alkaline Phosphatase: 142 U/L — ABNORMAL HIGH (ref 38–126)
Anion gap: 5 (ref 5–15)
BUN: 55 mg/dL — ABNORMAL HIGH (ref 8–23)
CO2: 23 mmol/L (ref 22–32)
Calcium: 6.9 mg/dL — ABNORMAL LOW (ref 8.9–10.3)
Chloride: 124 mmol/L — ABNORMAL HIGH (ref 98–111)
Creatinine, Ser: 2.02 mg/dL — ABNORMAL HIGH (ref 0.61–1.24)
GFR calc Af Amer: 37 mL/min — ABNORMAL LOW (ref >60)
GFR calc non Af Amer: 32 mL/min — ABNORMAL LOW (ref >60)
Glucose, Bld: 193 mg/dL — ABNORMAL HIGH (ref 70–99)
Potassium: 4 mmol/L (ref 3.5–5.1)
Sodium: 152 mmol/L — ABNORMAL HIGH (ref 135–145)
Total Bilirubin: 4.9 mg/dL — ABNORMAL HIGH (ref 0.3–1.2)
Total Protein: 8.5 g/dL — ABNORMAL HIGH (ref 6.5–8.1)

## 2020-05-23 LAB — GLUCOSE, CAPILLARY
Glucose-Capillary: 141 mg/dL — ABNORMAL HIGH (ref 70–99)
Glucose-Capillary: 178 mg/dL — ABNORMAL HIGH (ref 70–99)
Glucose-Capillary: 194 mg/dL — ABNORMAL HIGH (ref 70–99)
Glucose-Capillary: 195 mg/dL — ABNORMAL HIGH (ref 70–99)
Glucose-Capillary: 197 mg/dL — ABNORMAL HIGH (ref 70–99)
Glucose-Capillary: 244 mg/dL — ABNORMAL HIGH (ref 70–99)

## 2020-05-23 LAB — AMMONIA: Ammonia: 68 umol/L — ABNORMAL HIGH (ref 9–35)

## 2020-05-23 MED ORDER — AMIODARONE IV BOLUS ONLY 150 MG/100ML
150.0000 mg | Freq: Once | INTRAVENOUS | Status: AC
  Administered 2020-05-23: 150 mg via INTRAVENOUS
  Filled 2020-05-23: qty 100

## 2020-05-23 MED ORDER — METOPROLOL TARTRATE 5 MG/5ML IV SOLN
5.0000 mg | Freq: Four times a day (QID) | INTRAVENOUS | Status: DC | PRN
  Administered 2020-05-23 – 2020-05-30 (×3): 5 mg via INTRAVENOUS
  Filled 2020-05-23 (×3): qty 5

## 2020-05-23 MED ORDER — FREE WATER
300.0000 mL | Freq: Four times a day (QID) | Status: DC
  Administered 2020-05-23 – 2020-05-24 (×5): 300 mL

## 2020-05-23 NOTE — Progress Notes (Signed)
Subjective: Patient reports unresponsive  Objective: Vital signs in last 24 hours: Temp:  [97.7 F (36.5 C)-100.3 F (37.9 C)] 100.2 F (37.9 C) (05/31 0800) Pulse Rate:  [26-145] 73 (05/31 0900) Resp:  [16-52] 16 (05/31 0900) BP: (89-144)/(64-109) 128/103 (05/31 0900) SpO2:  [96 %-100 %] 100 % (05/31 0900) FiO2 (%):  [40 %] 40 % (05/31 0739)  Intake/Output from previous day: 05/30 0701 - 05/31 0700 In: 2209.7 [I.V.:805.9; NG/GT:930; IV Piggyback:473.8] Out: 2225 [Urine:1950; Stool:275] Intake/Output this shift: No intake/output data recorded.  Physical Exam: Patient is unresponsive.  He is not waking up, despite now lower levels of ammonia.  He is not opening his eyes and is not withdrawing to pain.  Sedation has been discontinued.  Lab Results: Recent Labs    05/22/20 0515 05/23/20 0507  WBC 11.2* 12.4*  HGB 8.9* 8.7*  HCT 28.4* 27.6*  PLT 97* 96*   BMET Recent Labs    05/22/20 0515 05/23/20 0507  NA 152* 152*  K 3.5 4.0  CL 124* 124*  CO2 24 23  GLUCOSE 190* 193*  BUN 55* 55*  CREATININE 2.03* 2.02*  CALCIUM 7.4* 6.9*    Studies/Results: MR BRAIN WO CONTRAST  Result Date: 05/22/2020 CLINICAL DATA:  Encephalopathy.  Altered mental status. EXAM: MRI HEAD WITHOUT CONTRAST TECHNIQUE: Multiplanar, multiecho pulse sequences of the brain and surrounding structures were obtained without intravenous contrast. COMPARISON:  CT head 04/30/2020 FINDINGS: Brain: Negative for acute infarct. Mild chronic microvascular ischemic change in the white matter. Mild generalized atrophy. Chronic hemorrhage in the left sylvian fissure. There is chronic hemorrhage in the insular cortex on the left as well as subarachnoid hemorrhage in the sylvian fissure and in left parietal lobe. Small amount of the subarachnoid hemorrhage right parietal lobe. No underlying mass lesion is identified. Etiology of the hemorrhage is not apparent. There is chronic hemorrhage in the left medial temporal  lobe with surrounding gliosis. This could be a cause of seizure. Vascular: Normal arterial flow voids Skull and upper cervical spine: No focal skeletal lesion. Sinuses/Orbits: Moderate mucosal edema paranasal sinuses. Bilateral proptosis. No orbital mass. Other: None IMPRESSION: 1. Negative for acute infarct 2. Chronic hemorrhage in the left insula, left medial temporal lobe, and in the subarachnoid space. Etiology of the hemorrhage is not apparent. No mass lesion identified. Electronically Signed   By: Franchot Gallo M.D.   On: 05/22/2020 15:51   MR CERVICAL SPINE WO CONTRAST  Result Date: 05/22/2020 CLINICAL DATA:  Encephalopathy. History of pathologic fracture of T6 with epidural hematoma and cord compression. Recent thoracic spinal surgery. EXAM: MRI CERVICAL SPINE WITHOUT CONTRAST TECHNIQUE: Multiplanar, multisequence MR imaging of the cervical spine was performed. No intravenous contrast was administered. COMPARISON:  Thoracic MRI 05/12/2020 FINDINGS: Alignment: Mild retrolisthesis C3-4. Vertebrae: 10 mm lesion T1 vertebral body and pedicle on the left. This appears to represent tumor based on the thoracic MRI appearance of multiple lesions in the bone marrow. No cervical spine bone lesion identified. Negative for fracture. Cord: Normal spinal cord signal. There is epidural hematoma beginning at C6-7 extending into the thoracic spine. This was present on the thoracic MRI as well. No cord compression. Posterior Fossa, vertebral arteries, paraspinal tissues: No cervical paraspinous mass. There is considerable edema in the posterior soft tissues of the thoracic spine related to recent surgery. Disc levels: C2-3: Mild disc and facet degeneration.  Negative for stenosis C3-4: Moderate disc degeneration and spurring. Mild facet degeneration. Cord flattening with mild spinal stenosis. Moderate foraminal stenosis bilaterally  due to spurring. C4-5: Disc degeneration and diffuse uncinate spurring. Mild foraminal  narrowing bilaterally C5-6: Disc degeneration and diffuse uncinate spurring right greater than left. Moderate right foraminal narrowing. No spinal stenosis C6-7: Mild to moderate central disc protrusion with mild spinal stenosis. Mild foraminal narrowing bilaterally due to spurring C7-T1: Moderate right foraminal encroachment due to spurring. Mild left foraminal narrowing. IMPRESSION: 1. 10 mm lesion T1 vertebral body and pedicle on the left compatible with myeloma or metastatic disease. No cervical bone lesion. 2. Multilevel degenerative changes in the cervical spine causing spinal and foraminal stenosis as above 3. Posterior epidural hematoma extending to C6-7 from the thoracic spine. This is not causing compression of the visualized spinal cord. This was present on the thoracic MRI. There are postsurgical changes in the posterior soft tissues thoracic spine due to recent surgery. Electronically Signed   By: Franchot Gallo M.D.   On: 05/22/2020 16:02    Assessment/Plan: Patient is unresponsive.  He is not waking up, despite now lower levels of ammonia.  He is not opening his eyes and is not withdrawing to pain.  Sedation has been discontinued.  This situation is bleak and, given his underlying malignancy, I wonder if what we are doing is in his best interests.  I have spoken with CCM service and they are of a similar opinion.  At the very least, we should have a goals of care meeting with the family to decide what the best and most humane course of action should be.      LOS: 10 days    Peggyann Shoals, MD 05/23/2020, 10:10 AM

## 2020-05-23 NOTE — Progress Notes (Signed)
   Brief Progress Note  I have had a long conversation with Louis Ford , patient's spouse. We discussed that Louis Ford is unresponsive and that he is now on vasoactive medications for BP support. We also discussed that MRI of the spine done 5/30 was + for a 10 mm  lesion on  T1 vertebral body compatible with myeloma or metastatic disease.  We discussed potential  for meaningful recovery and purposeful quality of life. When I asked her if she and Louis Ford had discussed end of life decisions, she told me that he did not want to be a bourdon to her. She is open to Palliation discussions. We discussed what Palliative care and Comfort Care mean, and she feels that may be the appropriate plan, but needs to speak with family members. She has agreed to a family meeting 6/1 between 11am and 12 noon.  I did discuss the option of changing Code Status to DNR today, in the event he declined before [name]. She would prefer to speak with family members and make that decision tomorrow after the conference.   Louis Spatz, MSN, AGACNP-BC [place] for personal pager PCCM on call pager [phone] 05/23/2020 12:14 PM

## 2020-05-23 NOTE — Progress Notes (Signed)
Subjective: Continues to be unresponsive.   Objective: Current vital signs: BP (!) 128/103   Pulse 73   Temp 100.2 F (37.9 C) (Axillary)   Resp 16   Ht _0  (1.88 m) Comment: Measured by RT  Wt 97.7 kg   SpO2 100%   BMI 27.65 kg/m  Vital signs in last 24 hours: Temp:  [97.7 F (36.5 C)-100.3 F (37.9 C)] 100.2 F (37.9 C) (05/31 0800) Pulse Rate:  [26-145] 73 (05/31 0900) Resp:  [16-48] 16 (05/31 0900) BP: (89-144)/(64-109) 128/103 (05/31 0900) SpO2:  [96 %-100 %] 100 % (05/31 0900) FiO2 (%):  [40 %] 40 % (05/31 0739)  Intake/Output from previous day: 05/30 0701 - 05/31 0700 In: 2209.7 [I.V.:805.9; NG/GT:930; IV Piggyback:473.8] Out: 2225 [Urine:1950; Stool:275] Intake/Output this shift: No intake/output data recorded. Nutritional status:  Diet Order            Diet NPO time specified  Diet effective midnight             HEENT: [place]/AT. Scleral icterus noted.  Lungs: Intubated Ext: Onychomycosis noted.   Neurologic Exam: Ment: No movement to any stimuli, other than eye opening to stimulation and head turning to noxious. Does not follow commands. No attempts to communicate.  CN: PERRL. No blink to threat. Eyes midline and slightly disconjugate. Weak doll's eye reflex. Corneals intact. Face flaccidly symmetric. Cough intact.  Motor/Sensory: No movement of upper or lower extremities to any stimuli. Flaccid tone x 4.  Reflexes: Hypoactive x 4. Toes mute.  Cerebellar/Gait: Unable to assess.   Lab Results: Results for orders placed or performed during the hospital encounter of 05/04/2020 (from the past 48 hour(s))  Ammonia     Status: Abnormal   Collection Time: 05/21/20 11:38 AM  Result Value Ref Range   Ammonia 64 (H) 9 - 35 umol/L    Comment: Performed at [facility], [address], [place], [place] [address]  Glucose, capillary     Status: Abnormal   Collection Time: 05/21/20 11:57 AM  Result Value Ref Range   Glucose-Capillary 139 (H) 70 - 99 mg/dL     Comment: Glucose reference range applies only to samples taken after fasting for at least 8 hours.  Glucose, capillary     Status: Abnormal   Collection Time: 05/21/20  4:13 PM  Result Value Ref Range   Glucose-Capillary 158 (H) 70 - 99 mg/dL    Comment: Glucose reference range applies only to samples taken after fasting for at least 8 hours.  Magnesium     Status: None   Collection Time: 05/21/20  5:45 PM  Result Value Ref Range   Magnesium 2.0 1.7 - 2.4 mg/dL    Comment: Performed at [facility], [place] [address] [place], [place] [address]  Phosphorus     Status: Abnormal   Collection Time: 05/21/20  5:45 PM  Result Value Ref Range   Phosphorus 1.7 (L) 2.5 - 4.6 mg/dL    Comment: Performed at [facility] [address], [place], [place] [address]  Glucose, capillary     Status: Abnormal   Collection Time: 05/21/20  7:32 PM  Result Value Ref Range   Glucose-Capillary 148 (H) 70 - 99 mg/dL    Comment: Glucose reference range applies only to samples taken after fasting for at least 8 hours.  Glucose, capillary     Status: Abnormal   Collection Time: 05/22/20 12:51 AM  Result Value Ref Range   Glucose-Capillary  135 (H) 70 - 99 mg/dL    Comment: Glucose reference range applies only to samples taken after fasting for at least 8 hours.  Glucose, capillary     Status: Abnormal   Collection Time: 05/22/20  3:51 AM  Result Value Ref Range   Glucose-Capillary 181 (H) 70 - 99 mg/dL    Comment: Glucose reference range applies only to samples taken after fasting for at least 8 hours.  CBC with Differential/Platelet     Status: Abnormal   Collection Time: 05/22/20  5:15 AM  Result Value Ref Range   WBC 11.2 (H) 4.0 - 10.5 K/uL   RBC 3.05 (L) 4.22 - 5.81 MIL/uL   Hemoglobin 8.9 (L) 13.0 - 17.0 g/dL   HCT 28.4 (L) 39.0 - 52.0 %   MCV 93.1 80.0 - 100.0 fL   MCH 29.2 26.0 - 34.0 pg   MCHC 31.3 30.0 - 36.0 g/dL   RDW 20.7 (H) 11.5 - 15.5 %   Platelets 97 (L) 150 - 400 K/uL     Comment: Immature Platelet Fraction may be clinically indicated, consider ordering this additional test [id number] REPEATED TO VERIFY    nRBC 12.1 (H) 0.0 - 0.2 %   Neutrophils Relative % 72 %   Neutro Abs 7.9 (H) 1.7 - 7.7 K/uL   Lymphocytes Relative 10 %   Lymphs Abs 1.2 0.7 - 4.0 K/uL   Monocytes Relative 13 %   Monocytes Absolute 1.5 (H) 0.1 - 1.0 K/uL   Eosinophils Relative 1 %   Eosinophils Absolute 0.1 0.0 - 0.5 K/uL   Basophils Relative 0 %   Basophils Absolute 0.0 0.0 - 0.1 K/uL   Immature Granulocytes 4 %   Abs Immature Granulocytes 0.49 (H) 0.00 - 0.07 K/uL    Comment: Performed at [facility], [address], [place], [place] [address]  Comprehensive metabolic panel     Status: Abnormal   Collection Time: 05/22/20  5:15 AM  Result Value Ref Range   Sodium 152 (H) 135 - 145 mmol/L   Potassium 3.5 3.5 - 5.1 mmol/L   Chloride 124 (H) 98 - 111 mmol/L   CO2 24 22 - 32 mmol/L   Glucose, Bld 190 (H) 70 - 99 mg/dL    Comment: Glucose reference range applies only to samples taken after fasting for at least 8 hours.   BUN 55 (H) 8 - 23 mg/dL   Creatinine, Ser 2.03 (H) 0.61 - 1.24 mg/dL   Calcium 7.4 (L) 8.9 - 10.3 mg/dL   Total Protein 8.2 (H) 6.5 - 8.1 g/dL   Albumin 1.8 (L) 3.5 - 5.0 g/dL   AST 1,184 (H) 15 - 41 U/L   ALT 655 (H) 0 - 44 U/L   Alkaline Phosphatase 137 (H) 38 - 126 U/L   Total Bilirubin 5.0 (H) 0.3 - 1.2 mg/dL   GFR calc non Af Amer 32 (L) >60 mL/min   GFR calc Af Amer 37 (L) >60 mL/min   Anion gap 4 (L) 5 - 15    Comment: Performed at [facility], [place] [address] [place], [place] [address]  Glucose, capillary     Status: Abnormal   Collection Time: 05/22/20  8:06 AM  Result Value Ref Range   Glucose-Capillary 144 (H) 70 - 99 mg/dL    Comment: Glucose reference range applies only to samples taken after fasting for at least 8 hours.  Ammonia     Status: Abnormal   Collection Time: 05/22/20  9:12 AM  Result Value Ref Range   Ammonia  51 (H) 9 - 35 umol/L    Comment: Performed at [facility] [address] [place], [place] [address]  Glucose, capillary     Status: Abnormal   Collection Time: 05/22/20 11:48 AM  Result Value Ref Range   Glucose-Capillary 146 (H) 70 - 99 mg/dL    Comment: Glucose reference range applies only to samples taken after fasting for at least 8 hours.  Glucose, capillary     Status: Abnormal   Collection Time: 05/22/20  3:48 PM  Result Value Ref Range   Glucose-Capillary 122 (H) 70 - 99 mg/dL    Comment: Glucose reference range applies only to samples taken after fasting for at least 8 hours.  Glucose, capillary     Status: Abnormal   Collection Time: 05/22/20  7:50 PM  Result Value Ref Range   Glucose-Capillary 209 (H) 70 - 99 mg/dL    Comment: Glucose reference range applies only to samples taken after fasting for at least 8 hours.   Comment 1 Notify RN    Comment 2 Document in Chart   Glucose, capillary     Status: Abnormal   Collection Time: 05/22/20 11:26 PM  Result Value Ref Range   Glucose-Capillary 177 (H) 70 - 99 mg/dL    Comment: Glucose reference range applies only to samples taken after fasting for at least 8 hours.   Comment 1 Notify RN    Comment 2 Document in Chart   Glucose, capillary     Status: Abnormal   Collection Time: 05/23/20  3:27 AM  Result Value Ref Range   Glucose-Capillary 141 (H) 70 - 99 mg/dL    Comment: Glucose reference range applies only to samples taken after fasting for at least 8 hours.   Comment 1 Notify RN    Comment 2 Document in Chart   CBC with Differential/Platelet     Status: Abnormal   Collection Time: 05/23/20  5:07 AM  Result Value Ref Range   WBC 12.4 (H) 4.0 - 10.5 K/uL   RBC 2.94 (L) 4.22 - 5.81 MIL/uL   Hemoglobin 8.7 (L) 13.0 - 17.0 g/dL   HCT 27.6 (L) 39.0 - 52.0 %   MCV 93.9 80.0 - 100.0 fL   MCH 29.6 26.0 - 34.0 pg   MCHC 31.5 30.0 - 36.0 g/dL   RDW 22.1 (H) 11.5 - 15.5 %   Platelets 96 (L) 150 - 400 K/uL    Comment:  SPECIMEN CHECKED FOR CLOTS CONSISTENT WITH PREVIOUS RESULT Immature Platelet Fraction may be clinically indicated, consider ordering this additional test [id number] REPEATED TO VERIFY    nRBC 14.8 (H) 0.0 - 0.2 %   Neutrophils Relative % 67 %   Neutro Abs 8.3 (H) 1.7 - 7.7 K/uL   Lymphocytes Relative 11 %   Lymphs Abs 1.3 0.7 - 4.0 K/uL   Monocytes Relative 18 %   Monocytes Absolute 2.3 (H) 0.1 - 1.0 K/uL   Eosinophils Relative 1 %   Eosinophils Absolute 0.1 0.0 - 0.5 K/uL   Basophils Relative 0 %   Basophils Absolute 0.0 0.0 - 0.1 K/uL   Immature Granulocytes 3 %   Abs Immature Granulocytes 0.38 (H) 0.00 - 0.07 K/uL    Comment: Performed at [facility], [address], [place], [place] [address]  Comprehensive metabolic panel     Status: Abnormal   Collection Time: 05/23/20  5:07 AM  Result Value  Ref Range   Sodium 152 (H) 135 - 145 mmol/L   Potassium 4.0 3.5 - 5.1 mmol/L   Chloride 124 (H) 98 - 111 mmol/L   CO2 23 22 - 32 mmol/L   Glucose, Bld 193 (H) 70 - 99 mg/dL    Comment: Glucose reference range applies only to samples taken after fasting for at least 8 hours.   BUN 55 (H) 8 - 23 mg/dL   Creatinine, Ser 2.02 (H) 0.61 - 1.24 mg/dL   Calcium 6.9 (L) 8.9 - 10.3 mg/dL   Total Protein 8.5 (H) 6.5 - 8.1 g/dL   Albumin 1.8 (L) 3.5 - 5.0 g/dL   AST 891 (H) 15 - 41 U/L   ALT 639 (H) 0 - 44 U/L   Alkaline Phosphatase 142 (H) 38 - 126 U/L   Total Bilirubin 4.9 (H) 0.3 - 1.2 mg/dL   GFR calc non Af Amer 32 (L) >60 mL/min   GFR calc Af Amer 37 (L) >60 mL/min   Anion gap 5 5 - 15    Comment: Performed at [facility], [place] [address], [place], [place] [address]  Glucose, capillary     Status: Abnormal   Collection Time: 05/23/20  7:42 AM  Result Value Ref Range   Glucose-Capillary 178 (H) 70 - 99 mg/dL    Comment: Glucose reference range applies only to samples taken after fasting for at least 8 hours.    Recent Results (from the past 240 hour(s))  SARS  Coronavirus 2 by RT PCR (hospital order, performed in [facility] hospital lab) Nasopharyngeal Nasopharyngeal Swab     Status: None   Collection Time: 04/24/2020  7:30 PM   Specimen: Nasopharyngeal Swab  Result Value Ref Range Status   SARS Coronavirus 2 NEGATIVE NEGATIVE Final    Comment: (NOTE) SARS-CoV-2 target nucleic acids are NOT DETECTED. The SARS-CoV-2 RNA is generally detectable in upper and lower respiratory specimens during the acute phase of infection. The lowest concentration of SARS-CoV-2 viral copies this assay can detect is 250 copies / mL. A negative result does not preclude SARS-CoV-2 infection and should not be used as the sole basis for treatment or other patient management decisions.  A negative result may occur with improper specimen collection / handling, submission of specimen other than nasopharyngeal swab, presence of viral mutation(s) within the areas targeted by this assay, and inadequate number of viral copies (<250 copies / mL). A negative result must be combined with clinical observations, patient history, and epidemiological information. Fact Sheet for Patients:   StrictlyIdeas.no Fact Sheet for Healthcare Providers: BankingDealers.co.za This test is not yet approved or cleared  by the [place] FDA and has been authorized for detection and/or diagnosis of SARS-CoV-2 by FDA under an Emergency Use Authorization (EUA).  This EUA will remain in effect (meaning this test can be used) for the duration of the COVID-19 declaration under Section 564(b)(1) of the Act, 21 U.S.C. section 360bbb-3(b)(1), unless the authorization is terminated or revoked sooner. Performed at [facility], [place] [address], [place], [place] [address]   Urine culture     Status: Abnormal   Collection Time: 05/10/2020  8:10 PM   Specimen: Urine, Clean Catch  Result Value Ref Range Status   Specimen Description URINE, CLEAN CATCH   Final   Special Requests   Final    NONE Performed at [facility], [place] [address], [place], [place] [address]    Culture >=100,000 COLONIES/mL SERRATIA MARCESCENS (  A)  Final   Report Status 05/17/2020 FINAL  Final   Organism ID, Bacteria SERRATIA MARCESCENS (A)  Final      Susceptibility   Serratia marcescens - MIC*    CEFAZOLIN >=64 RESISTANT Resistant     CEFTRIAXONE 2 SENSITIVE Sensitive     CIPROFLOXACIN <=0.25 SENSITIVE Sensitive     GENTAMICIN <=1 SENSITIVE Sensitive     NITROFURANTOIN 256 RESISTANT Resistant     TRIMETH/SULFA <=20 SENSITIVE Sensitive     * >=100,000 COLONIES/mL SERRATIA MARCESCENS  MRSA PCR Screening     Status: None   Collection Time: 04/29/2020 11:41 PM   Specimen: Nasal Mucosa; Nasopharyngeal  Result Value Ref Range Status   MRSA by PCR NEGATIVE NEGATIVE Final    Comment:        The GeneXpert MRSA Assay (FDA approved for NASAL specimens only), is one component of a comprehensive MRSA colonization surveillance program. It is not intended to diagnose MRSA infection nor to guide or monitor treatment for MRSA infections. Performed at [facility], [place] [address], [place], [place] [address]   Surgical PCR screen     Status: None   Collection Time: 05/15/20  3:02 AM   Specimen: Nasal Mucosa; Nasal Swab  Result Value Ref Range Status   MRSA, PCR NEGATIVE NEGATIVE Final   Staphylococcus aureus NEGATIVE NEGATIVE Final    Comment: (NOTE) The Xpert SA Assay (FDA approved for NASAL specimens in patients 33 years of age and older), is one component of a comprehensive surveillance program. It is not intended to diagnose infection nor to guide or monitor treatment. Performed at [facility], [place] [address], [place], [place] [address]   Culture, respiratory     Status: None   Collection Time: 05/20/20  1:55 AM   Specimen: Endotracheal  Result Value Ref Range Status   Specimen Description ENDOTRACHEAL  Final   Special Requests NONE   Final   Gram Stain   Final    FEW WBC PRESENT,BOTH PMN AND MONONUCLEAR FEW GRAM POSITIVE COCCI IN PAIRS IN CHAINS Performed at [facility], [place] [address], [place], [place] [address]    Culture ABUNDANT PSEUDOMONAS AERUGINOSA  Final   Report Status 05/22/2020 FINAL  Final   Organism ID, Bacteria PSEUDOMONAS AERUGINOSA  Final      Susceptibility   Pseudomonas aeruginosa - MIC*    CEFTAZIDIME 2 SENSITIVE Sensitive     CIPROFLOXACIN <=0.25 SENSITIVE Sensitive     GENTAMICIN <=1 SENSITIVE Sensitive     IMIPENEM 1 SENSITIVE Sensitive     PIP/TAZO 8 SENSITIVE Sensitive     CEFEPIME 4 SENSITIVE Sensitive     * ABUNDANT PSEUDOMONAS AERUGINOSA    Lipid Panel Recent Labs    05/21/20 0538  TRIG 72    Studies/Results: MR BRAIN WO CONTRAST  Result Date: 05/22/2020 CLINICAL DATA:  Encephalopathy.  Altered mental status. EXAM: MRI HEAD WITHOUT CONTRAST TECHNIQUE: Multiplanar, multiecho pulse sequences of the brain and surrounding structures were obtained without intravenous contrast. COMPARISON:  CT head 04/30/2020 FINDINGS: Brain: Negative for acute infarct. Mild chronic microvascular ischemic change in the white matter. Mild generalized atrophy. Chronic hemorrhage in the left sylvian fissure. There is chronic hemorrhage in the insular cortex on the left as well as subarachnoid hemorrhage in the sylvian fissure and in left parietal lobe. Small amount of the subarachnoid hemorrhage right parietal lobe. No underlying mass lesion is identified. Etiology of the hemorrhage is not apparent. There is chronic hemorrhage in  the left medial temporal lobe with surrounding gliosis. This could be a cause of seizure. Vascular: Normal arterial flow voids Skull and upper cervical spine: No focal skeletal lesion. Sinuses/Orbits: Moderate mucosal edema paranasal sinuses. Bilateral proptosis. No orbital mass. Other: None IMPRESSION: 1. Negative for acute infarct 2. Chronic hemorrhage in the left insula, left  medial temporal lobe, and in the subarachnoid space. Etiology of the hemorrhage is not apparent. No mass lesion identified. Electronically Signed   By: Franchot Gallo M.D.   On: 05/22/2020 15:51   MR CERVICAL SPINE WO CONTRAST  Result Date: 05/22/2020 CLINICAL DATA:  Encephalopathy. History of pathologic fracture of T6 with epidural hematoma and cord compression. Recent thoracic spinal surgery. EXAM: MRI CERVICAL SPINE WITHOUT CONTRAST TECHNIQUE: Multiplanar, multisequence MR imaging of the cervical spine was performed. No intravenous contrast was administered. COMPARISON:  Thoracic MRI 05/12/2020 FINDINGS: Alignment: Mild retrolisthesis C3-4. Vertebrae: 10 mm lesion T1 vertebral body and pedicle on the left. This appears to represent tumor based on the thoracic MRI appearance of multiple lesions in the bone marrow. No cervical spine bone lesion identified. Negative for fracture. Cord: Normal spinal cord signal. There is epidural hematoma beginning at C6-7 extending into the thoracic spine. This was present on the thoracic MRI as well. No cord compression. Posterior Fossa, vertebral arteries, paraspinal tissues: No cervical paraspinous mass. There is considerable edema in the posterior soft tissues of the thoracic spine related to recent surgery. Disc levels: C2-3: Mild disc and facet degeneration.  Negative for stenosis C3-4: Moderate disc degeneration and spurring. Mild facet degeneration. Cord flattening with mild spinal stenosis. Moderate foraminal stenosis bilaterally due to spurring. C4-5: Disc degeneration and diffuse uncinate spurring. Mild foraminal narrowing bilaterally C5-6: Disc degeneration and diffuse uncinate spurring right greater than left. Moderate right foraminal narrowing. No spinal stenosis C6-7: Mild to moderate central disc protrusion with mild spinal stenosis. Mild foraminal narrowing bilaterally due to spurring C7-T1: Moderate right foraminal encroachment due to spurring. Mild left  foraminal narrowing. IMPRESSION: 1. 10 mm lesion T1 vertebral body and pedicle on the left compatible with myeloma or metastatic disease. No cervical bone lesion. 2. Multilevel degenerative changes in the cervical spine causing spinal and foraminal stenosis as above 3. Posterior epidural hematoma extending to C6-7 from the thoracic spine. This is not causing compression of the visualized spinal cord. This was present on the thoracic MRI. There are postsurgical changes in the posterior soft tissues thoracic spine due to recent surgery. Electronically Signed   By: Franchot Gallo M.D.   On: 05/22/2020 16:02    Medications:  Scheduled: . amiodarone  200 mg Per Tube Daily  . chlorhexidine gluconate (MEDLINE KIT)  15 mL Mouth Rinse BID  . Chlorhexidine Gluconate Cloth  6 each Topical Daily  . docusate  100 mg Per Tube BID  . feeding supplement (PIVOT 1.5 CAL)  1,000 mL Per Tube Q24H  . free water  300 mL Per Tube Q8H  . furosemide  40 mg Per Tube Daily  . insulin aspart  0-9 Units Subcutaneous Q4H  . lactulose  30 g Per Tube TID  . levETIRAcetam  1,000 mg Per Tube BID  . LORazepam  2 mg Intravenous Once  . mouth rinse  15 mL Mouth Rinse 10 times per day  . pantoprazole sodium  40 mg Per Tube Daily  . phenytoin (DILANTIN) IV  100 mg Intravenous BID  . polyethylene glycol  17 g Per Tube Daily  . rifaximin  550 mg Per Tube  BID  . senna-docusate  1 tablet Per Tube BID  . sodium chloride flush  10-40 mL Intracatheter Q12H   Continuous: . sodium chloride    . dextrose 5 % and 0.9% NaCl 10 mL/hr at 05/23/20 0700  . norepinephrine (LEVOPHED) Adult infusion 11 mcg/min (05/23/20 0548)  . piperacillin-tazobactam (ZOSYN)  IV 3.375 g (05/23/20 0848)    Assessment: 73 y.o.malewith past medical history significant for atrial fibrillation, CHF with AICD placement, coronary artery disease, hypertension, factor VII deficiency, prior stroke, epilepsy on 3 antiepileptic medications admitted on 5/21 after  presenting with paraplegia due to epidural hematoma and T6 compression fracture in the setting of bleeding diathesis and new diagnosis of multiple myeloma.Patient was status post laminectomy and hematoma evacuation - he was successfully extubated and moving all 4 extremities against gravity.On 5/27the patient became more somnolent and had agonal breathing requiring re-intubation. 1. MRI brain on 5/30 was negative for acute infarct. Chronic hemorrhage in the left insula, left medial temporal lobe, and in the subarachnoid space was noted. Etiology of the hemorrhage is not apparent. No mass lesion identified. 2. 24hr EEG:No seizures 3. Ammonia level 111>64>>51 4. Patient remains obtunded/vegetative despite improvement of ammonia level and continuous EEG not showing electrographic seizures.  Given no acute abnormality on MRI brain, it is now more likely that his clinical exam is lagging behind improvement in ammonia. 5. MRI cervical spine: 10 mm lesion T1 vertebral body and pedicle on the left compatible with myeloma or metastatic disease. No cervical bone lesion. Multilevel degenerative changes in the cervical spine causing spinal and foraminal stenosis. Posterior epidural hematoma extending to C6-7 from the thoracic spine. This is not causing compression of the visualized spinal cord. This was present on the thoracic MRI. There are postsurgical changes in the posterior soft tissues of the thoracic spine due to recent surgery. 6. History of seizures on multiple AEDs-EEG negative for seizures/nonconvulsive status epilepticus 7. Multiple myeloma, Factor VII deficiency, T6 compression fracture with epidural hematoma, atrial fibrillation   Recommendations: -- Continue lactulose and monitor ammonia levels, liver enzymes -- Continue Keppra, Dilantin -- Repeat Dilantin level, free and total (ordered) -- Continue to hold Depakote due to elevated ammonia -- Seizure precautions -- Frequent  neurochecks  35 minutes spent in the neurological evaluation and management of this critically ill patient   LOS: 10 days   _0  signed: Dr. Kerney Elbe 05/23/2020  10:35 AM

## 2020-05-23 NOTE — Progress Notes (Signed)
PT Cancellation Note  Patient Details Name: Louis Ford. MRN: [id number] DOB: 04-Sep-1946   Cancelled Treatment:    Reason Eval/Treat Not Completed: Medical issues which prohibited therapy; patient intubated over the weekend and now hypotensive requiring pressors.  Will check back another day.    Reginia Naas 05/23/2020, 12:15 PM  Magda Kiel, [place] [phone] 05/23/2020

## 2020-05-23 NOTE — Plan of Care (Signed)
  Problem: Nutrition: Goal: Adequate nutrition will be maintained Outcome: Progressing   

## 2020-05-23 NOTE — Progress Notes (Addendum)
NAME:  Louis Sako., MRN:  [id number], DOB:  1946-10-08, LOS: 10 ADMISSION DATE:  05/10/2020, CONSULTATIO care unit N DATE:  05/23/20 REFERRING MD:  Neurosurgery, CHIEF COMPLAINT:   Fall  Brief History   74 year old male who presented to the emergency department 5/21 after a fall while walking to the bathroom was found to have pathologic compression fracture of T6 and large volume spinal epidural hemorrhage requiring hematoma evacuation and thoracic laminectomy.  He was intubated for procedure and transferred to the intensive care unit postop.  History of present illness   74 year old male with a history of hypertension, hyperlipidemia, CAD s/p CABG 2011, ischemic cardiomyopathy HFrEF, AICD, atrial fibrillation, CVA, seizure disorder, factor VII deficiency, not clearly diagnosed bleeding diathesis despite extensive work-up who was walking to the bathroom with his walker when he lost his balance and fell to seated position after his legs just gave out on him.  He was having significant pain on presentation to the ED and was found to have pathologic compression fracture of T6  large volume of Spinal Epidural Hemorrhage spanning from the cervical spine throughout the thoracic spine to the conus.  Widespread spinal cord mass-effect.  Neurosurgery and hematology evaluated, recommendation for 3 units FFP, 1 unit of platelets and 1 g of TXA  preprocedure, 1 unit platelets, 1 g TXA post procedure with additional FFP depending on volume status.  Neurosurgery performed T6 laminectomy and epidural clot evacuation.  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   Significant Hospital Events   5/21 admit to neurosurgery 5/22 to ICU postop 5/24 - OR for T4-T8 arthrodesis. T6 found to be infiltrated with tumor  Consults:  PCCM Hematology  Procedures:  5/22 thoracic laminectomy  Significant Diagnostic Tests:  05/22/2020 MR Brain wo Contrast: Negative for acute infarct Chronic hemorrhage in the left insula, left medial temporal lobe, and in the subarachnoid space. Etiology of the hemorrhage is not apparent. No mass lesion identified.  5/30 MR Spine 10 mm lesion T1 vertebral body and pedicle on the left compatible with myeloma or metastatic disease. No cervical bone lesion. Multilevel degenerative changes in the cervical spine causing spinal and foraminal stenosis as above Posterior epidural hematoma extending to C6-7 from the thoracic spine. This is not causing compression of the visualized spinal cord. This was present on the thoracic MRI. There are postsurgical changes in the posterior soft tissues thoracic spine due to recent surgery.  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 5/28 IMPRESSION: 1. Stable remote infarct of the left posterior insular cortex and left superior medial temporal lobe. 2. Stable atrophy and white matter disease. 3. No acute intracranial abnormality or significant interval change.  Micro Data:  5/21 MRSA screen>> negative 5/21 SARS-Cov-2>> negative 5/21 urine culture>>   Antimicrobials:  Ceftriaxone 5/21 Zosyn 5/27>>  Interim history/subjective:  Encephalopathic Not following commands Remains unresponsive  Objective   Blood pressure (!) 128/103, pulse 73, temperature 100.2 F (37.9 C), temperature source Axillary, resp. rate 16, height 6' 2"  (1.88 m), weight 97.7 kg, SpO2 100 %.    Vent Mode: PRVC FiO2 (%):  [40 %] 40 % Set Rate:  [16 bmp-18 bmp] 18 bmp Vt Set:  [650 mL] 650 mL PEEP:  [5 cmH20] 5 cmH20 Plateau Pressure:  [23 cmH20-30 cmH20] 24 cmH20   Intake/Output Summary (Last 24 hours) at 05/23/2020 0949 Last data filed at 05/23/2020 0700 Gross per 24 hour  Intake 1825.97 ml  Output 2225 ml  Net -399.03 ml   Filed Weights   05/14/20 1200 05/10/2020 1436 05/21/20 0500  Weight: 87 kg 87 kg 97.7 kg    General: Comfortable HEENT: senile arcus, anicteric Neuro: Spontaneous movement but does not respond to verbal commands  CV: S1-S2 appreciated PULM: Clear breath sounds  Resolved Hospital Problem list     Assessment & Plan:    Critically ill due to respiratory failure requiring mechanical ventilation. Plan -Continue mechanical ventilation  Target TVol 6-8cc/kgIBW Target Plateau Pressure < 30cm H20 Target driving pressure less than 15 cm of water Ventilator associated pneumonia prevention protocol   Persistent but improved patchy bilateral opacities. Decreased pleural effusions Sputum Cx + for  pseudomonas aeruginosa  Plan Continue  Zosyn CXR prn Culture as is clinically appropriate Trend fever/ WBC  Toxic metabolic encephalopathy which is improving.  No seizures on EEG No sedation -Minimize sedative medications. -Elevated ammonia level-being corrected-51 -Allow time for hypoactive delirium to clear. -CT head-no hemorrhage  Hyperammonemia-currently 51 -Continue to trend -Continue lactulose -Continue Xifaxan  Hypernatremia Remains 152 -Continue  free water  - Will increase free water to Q 6 - D5 water bolus prn  Fall with pathologic T6 fracture and epidural hematoma causing cord compression.  Status post T6 laminectomy and spine stabilization 5/25. Myeloma related with IgG Kappa spike - Oncology is seeing patient. - Plan for bone scan and bone biopsy if patient is stable over the weekend - Will try to arrange for family conference to address goals of care, as Comfort care should be offered  Bleeding diathesis, felt to be related to paraprotein Plts 96 K -Worked up by Dr. Irene Limbo and at [facility] by [name] -Work-up for possible multiple myeloma per hematology -pathology pending -Consider TXA infusion if requires further OR -Empirically transfuse FFP. -Consider tag to better define clotting disorder. - Trend CBC - Transfuse for HGB < 7  History of seizure disorder Sound like both tonic-clonic, followed by neurology Continue Keppra and Dilantin per [place]  HFrEF, CAD s/p CABG, AICD, atrial fibrillation, hypertension, hyperlipidemia RVR, rate 130 On Levophed at 6 mcg Last echocardiogram in 03/2020 with EF of 30 to 35%, not chronically anti-coagulated given bleeding disorder - Amiodarone dose decreased - Will give bolus of Amio 150 now x 1 - Will add prn Metoprolol for additional rate control - Wean Levophed for MAP > 65 -Continue Lasix, monitor volume status and I&O's   Will  call family and arrange for conference 6/1 to discuss goals of care. Would encourage  neurosurgery  to participate in this conversation. With suspicion of myeloma with mets, comfort care , palliation should be offered.  Daily Goals Checklist  Pain/Anxiety/Delirium protocol (if indicated): stopped all except oxycodone. Keep off sedative infusions if possible Neuro vitals: every 2 hours AED's: Continue home AED's VAP protocol (if indicated): bundle in place, chest PT Respiratory support goals: Full support. SBT once mental status improves. Blood pressure target: Allow autoregulation. DVT prophylaxis: SCD's only due to bleeding diathesis.  Nutrition Status: Cortrak in place GI prophylaxis: pantoprazole Fluid status goals: allow autoregulation.  Urinary catheter: Condom Central lines: L IJ CVC Glucose control: adequate control of T2DM with SSI. Mobility/therapy needs: Bedrest then PT once extubated.  Antibiotic de-escalation: Zosyn for 7 days to cover HCAP Home medication reconciliation:  Home AED, HF meds on hold Daily labs: CBC, BMP Code Status: Full  Family Communication: per Neurosurgery Disposition: ICU    Labs   CBC: Recent Labs  Lab 05/17/20 0333 05/17/20 0333 05/20/20 0141 05/20/20 1741 05/21/20 0538 05/22/20 0515 05/23/20 0507  WBC 6.1  --   --  9.4 8.6 11.2* 12.4*  NEUTROABS 5.5  --   --   --  6.5 7.9* 8.3*  HGB 8.8*   < > 13.6 9.4* 9.1* 8.9* 8.7*  HCT 27.0*   < > 40.0 29.9* 29.5* 28.4* 27.6*  MCV 89.4  --   --  94.0 93.7 93.1 93.9  PLT 188  --   --  104* 107* 97* 96*   < > = values in this interval not displayed.    Basic Metabolic Panel: Recent Labs  Lab 05/17/20 0333 05/17/20 0333 05/20/20 0141 05/20/20 1440 05/20/20 1618 05/20/20 1741 05/21/20 0538 05/21/20 1745 05/22/20 0515 05/23/20 0507  NA 140   < > 148*  --   --  146* 148*  --  152* 152*  K 4.3   < > 5.1  --   --  5.5* 3.7  --  3.5 4.0  CL 111  --   --   --   --  117* 119*  --  124* 124*  CO2 21*  --   --   --   --  16* 21*  --  24 23  GLUCOSE 166*  --   --   --   --  160*  194*  --  190* 193*  BUN 24*  --   --   --   --  49* 52*  --  55* 55*  CREATININE 1.52*  --   --   --   --  2.67* 2.36*  --  2.03* 2.02*  CALCIUM 8.1*  --   --   --   --  7.8* 7.5*  --  7.4* 6.9*  MG  --   --   --  1.8 2.0  --  1.9 2.0  --   --   PHOS  --   --   --  4.2 4.3  --  2.1* 1.7*  --   --    < > = values in this interval not displayed.   CCM APP time: 27 minutes   Magdalen Spatz, MSN, AGACNP-BC [place] for personal pager PCCM on call pager [phone]    05/23/2020    9:50 AM

## 2020-05-23 NOTE — Progress Notes (Signed)
Pharmacist Heart Failure Core Measure Documentation  Assessment: [facility]. has an EF documented as 30-35% on 04/12/2020 by [name], D.  Rationale: Heart failure patients with left ventricular systolic dysfunction (LVSD) and an EF < 40% should be prescribed an angiotensin converting enzyme inhibitor (ACEI) or angiotensin receptor blocker (ARB) at discharge unless a contraindication is documented in the medical record.  This patient is not currently on an ACEI or ARB for HF.  This note is being placed in the record in order to provide documentation that a contraindication to the use of these agents is present for this encounter.  ACE Inhibitor or Angiotensin Receptor Blocker is contraindicated (specify all that apply)  []   ACEI allergy AND ARB allergy []   Angioedema []   Moderate or severe aortic stenosis []   Hyperkalemia [x]   Hypotension []   Renal artery stenosis []   Worsening renal function, preexisting renal disease or dysfunction   Marshia Ly 05/23/2020 2:39 PM

## 2020-05-24 ENCOUNTER — Inpatient Hospital Stay (HOSPITAL_COMMUNITY): Payer: Medicare Other

## 2020-05-24 LAB — GLUCOSE, CAPILLARY
Glucose-Capillary: 241 mg/dL — ABNORMAL HIGH (ref 70–99)
Glucose-Capillary: 229 mg/dL — ABNORMAL HIGH (ref 70–99)
Glucose-Capillary: 191 mg/dL — ABNORMAL HIGH (ref 70–99)
Glucose-Capillary: 160 mg/dL — ABNORMAL HIGH (ref 70–99)
Glucose-Capillary: 163 mg/dL — ABNORMAL HIGH (ref 70–99)
Glucose-Capillary: 194 mg/dL — ABNORMAL HIGH (ref 70–99)

## 2020-05-24 LAB — CBC WITH DIFFERENTIAL/PLATELET
Abs Immature Granulocytes: 0.29 10*3/uL — ABNORMAL HIGH (ref 0.00–0.07)
Basophils Absolute: 0 10*3/uL (ref 0.0–0.1)
Basophils Relative: 0 %
Eosinophils Absolute: 0.1 10*3/uL (ref 0.0–0.5)
Eosinophils Relative: 1 %
HCT: 26.6 % — ABNORMAL LOW (ref 39.0–52.0)
Hemoglobin: 8.4 g/dL — ABNORMAL LOW (ref 13.0–17.0)
Immature Granulocytes: 2 %
Lymphocytes Relative: 5 %
Lymphs Abs: 0.7 10*3/uL (ref 0.7–4.0)
MCH: 29.7 pg (ref 26.0–34.0)
MCHC: 31.6 g/dL (ref 30.0–36.0)
MCV: 94 fL (ref 80.0–100.0)
Monocytes Absolute: 2.5 10*3/uL — ABNORMAL HIGH (ref 0.1–1.0)
Monocytes Relative: 19 %
Neutro Abs: 9.2 10*3/uL — ABNORMAL HIGH (ref 1.7–7.7)
Neutrophils Relative %: 73 %
Platelets: 86 10*3/uL — ABNORMAL LOW (ref 150–400)
RBC: 2.83 MIL/uL — ABNORMAL LOW (ref 4.22–5.81)
RDW: 22.5 % — ABNORMAL HIGH (ref 11.5–15.5)
WBC: 12.7 10*3/uL — ABNORMAL HIGH (ref 4.0–10.5)
nRBC: 6.2 % — ABNORMAL HIGH (ref 0.0–0.2)

## 2020-05-24 LAB — BRAIN NATRIURETIC PEPTIDE: B Natriuretic Peptide: 741.5 pg/mL — ABNORMAL HIGH (ref 0.0–100.0)

## 2020-05-24 LAB — AMMONIA: Ammonia: 95 umol/L — ABNORMAL HIGH (ref 9–35)

## 2020-05-24 LAB — COMPREHENSIVE METABOLIC PANEL
ALT: 423 U/L — ABNORMAL HIGH (ref 0–44)
AST: 351 U/L — ABNORMAL HIGH (ref 15–41)
Albumin: 1.7 g/dL — ABNORMAL LOW (ref 3.5–5.0)
Alkaline Phosphatase: 132 U/L — ABNORMAL HIGH (ref 38–126)
Anion gap: 7 (ref 5–15)
BUN: 63 mg/dL — ABNORMAL HIGH (ref 8–23)
CO2: 24 mmol/L (ref 22–32)
Calcium: 7 mg/dL — ABNORMAL LOW (ref 8.9–10.3)
Chloride: 123 mmol/L — ABNORMAL HIGH (ref 98–111)
Creatinine, Ser: 2.09 mg/dL — ABNORMAL HIGH (ref 0.61–1.24)
GFR calc Af Amer: 36 mL/min — ABNORMAL LOW (ref >60)
GFR calc non Af Amer: 31 mL/min — ABNORMAL LOW (ref >60)
Glucose, Bld: 251 mg/dL — ABNORMAL HIGH (ref 70–99)
Potassium: 3.9 mmol/L (ref 3.5–5.1)
Sodium: 154 mmol/L — ABNORMAL HIGH (ref 135–145)
Total Bilirubin: 4.6 mg/dL — ABNORMAL HIGH (ref 0.3–1.2)
Total Protein: 8.3 g/dL — ABNORMAL HIGH (ref 6.5–8.1)

## 2020-05-24 LAB — MAGNESIUM: Magnesium: 2.2 mg/dL (ref 1.7–2.4)

## 2020-05-24 MED ORDER — FREE WATER
300.0000 mL | Status: DC
  Administered 2020-05-24 – 2020-05-27 (×13): 300 mL

## 2020-05-24 MED ORDER — INSULIN DETEMIR 100 UNIT/ML ~~LOC~~ SOLN
7.0000 [IU] | SUBCUTANEOUS | Status: DC
  Administered 2020-05-24 – 2020-05-29 (×5): 7 [IU] via SUBCUTANEOUS
  Filled 2020-05-24 (×7): qty 0.07

## 2020-05-24 MED ORDER — LACTULOSE 10 GM/15ML PO SOLN
30.0000 g | Freq: Four times a day (QID) | ORAL | Status: DC
  Administered 2020-05-24 – 2020-05-29 (×19): 30 g
  Filled 2020-05-24 (×20): qty 45

## 2020-05-24 NOTE — Progress Notes (Signed)
Patient ID: Louis Ford., male   DOB: [birth year], 74 y.o.   MRN: [id number] BP 111/83   Pulse (!) 50   Temp (!) 100.4 F (38 C) (Axillary)   Resp (!) 25   Ht 6\' 2"  (1.88 m) Comment: Measured by RT  Wt 98.2 kg   SpO2 100%   BMI 27.80 kg/m  Not alert, minimally responsive No acute cerebral pathology Mrs. Louis Ford would like to push forward with treatment She does understand the cancer dx, and his current state which is poor

## 2020-05-24 NOTE — Progress Notes (Signed)
Attending:    Subjective: Some movement to voice today, some withdrawal to pain  Objective: Vitals:   05/24/20 1015 05/24/20 1030 05/24/20 1045 05/24/20 1100  BP: 92/78 93/67 101/78 (!) 84/66  Pulse: 61 74 (!) 105 62  Resp: (!) 21 (!) 31 (!) 21 (!) 25  Temp:      TempSrc:      SpO2: 100% 100% 100% 100%  Weight:      Height:       Vent Mode: PRVC FiO2 (%):  [40 %] 40 % Set Rate:  [18 bmp] 18 bmp Vt Set:  [650 mL] 650 mL PEEP:  [5 cmH20] 5 cmH20 Plateau Pressure:  [20 cmH20-25 cmH20] 20 cmH20  Intake/Output Summary (Last 24 hours) at 05/24/2020 1141 Last data filed at 05/24/2020 1100 Gross per 24 hour  Intake 3887.92 ml  Output 2570 ml  Net 1317.92 ml    General:  In bed on vent HENT: NCAT ETT in place PULM: CTA B, vent supported breathing CV: RRR, no mgr GI: BS+, soft, nontender MSK: normal bulk and tone Derm: anasarca Neuro: turns head to me when I speak to him, will make some spontaneous movments (shrugs shoulders), no eye opening     CBC    Component Value Date/Time   WBC 12.7 (H) 05/24/2020 0525   RBC 2.83 (L) 05/24/2020 0525   HGB 8.4 (L) 05/24/2020 0525   HGB 6.6 (LL) 01/12/2020 1149   HCT 26.6 (L) 05/24/2020 0525   HCT 20.0 (L) 01/12/2020 1149   PLT 86 (L) 05/24/2020 0525   PLT 231 01/12/2020 1149   MCV 94.0 05/24/2020 0525   MCV 92 01/12/2020 1149   MCH 29.7 05/24/2020 0525   MCHC 31.6 05/24/2020 0525   RDW 22.5 (H) 05/24/2020 0525   RDW 14.5 01/12/2020 1149   LYMPHSABS 0.7 05/24/2020 0525   LYMPHSABS 0.9 01/12/2020 1149   MONOABS 2.5 (H) 05/24/2020 0525   EOSABS 0.1 05/24/2020 0525   EOSABS 0.1 01/12/2020 1149   BASOSABS 0.0 05/24/2020 0525   BASOSABS 0.0 01/12/2020 1149    BMET    Component Value Date/Time   NA 154 (H) 05/24/2020 0525   NA 127 (L) 01/12/2020 1149   K 3.9 05/24/2020 0525   CL 123 (H) 05/24/2020 0525   CO2 24 05/24/2020 0525   GLUCOSE 251 (H) 05/24/2020 0525   BUN 63 (H) 05/24/2020 0525   BUN 46 (H) 01/12/2020 1149    CREATININE 2.09 (H) 05/24/2020 0525   CREATININE 1.57 (H) 12/29/2019 1221   CALCIUM 7.0 (L) 05/24/2020 0525   GFRNONAA 31 (L) 05/24/2020 0525   GFRNONAA 44 (L) 12/29/2019 1221   GFRAA 36 (L) 05/24/2020 0525   GFRAA 51 (L) 12/29/2019 1221    CXR images reviewed: cardiomegaly, ETT in place, lungs clear  Impression/Plan:  Atrial fibrillation: continue amiodarone Acute metabolic encephalopathy: multifactorial, some improvement with responsiveness and movement today (have spoken to his wife and multiple staff members who confirm this).  Wife wants aggressive care.  I suspect his recovery will be slow, so recommend a tracheostomy.  Explained to wife today who agrees. Acute respiratory failure with hypoxemia> spontaneousl breathing trial daily Pseudomonas pneumonia> zosyn per pharmacy for 7 days Multiple myeloma> montor CBC, renal funcoitn  Chronic systolic heart failure> wean off levophed for MAP > 65 Spinal cord injury: per neurosurgery   My cc time 35 minutes  Roselie Awkward, MD [place] [facility] Pager: [phone] Cell: [phone] After 3pm or if no response, call [phone]

## 2020-05-24 NOTE — Progress Notes (Addendum)
Family meeting cancelled for today.  Wife expressed that she feels the pt is starting to move and wake up more and would like to give him more time.  Wife said she will reschedule later.  Dr. Lake Bells notified.

## 2020-05-24 DEATH — deceased

## 2020-05-25 LAB — CBC WITH DIFFERENTIAL/PLATELET
Abs Immature Granulocytes: 0.23 10*3/uL — ABNORMAL HIGH (ref 0.00–0.07)
Basophils Absolute: 0 10*3/uL (ref 0.0–0.1)
Basophils Relative: 0 %
Eosinophils Absolute: 0.1 10*3/uL (ref 0.0–0.5)
Eosinophils Relative: 1 %
HCT: 25.6 % — ABNORMAL LOW (ref 39.0–52.0)
Hemoglobin: 8.1 g/dL — ABNORMAL LOW (ref 13.0–17.0)
Immature Granulocytes: 2 %
Lymphocytes Relative: 13 %
Lymphs Abs: 1.6 10*3/uL (ref 0.7–4.0)
MCH: 29.6 pg (ref 26.0–34.0)
MCHC: 31.6 g/dL (ref 30.0–36.0)
MCV: 93.4 fL (ref 80.0–100.0)
Monocytes Absolute: 1.5 10*3/uL — ABNORMAL HIGH (ref 0.1–1.0)
Monocytes Relative: 13 %
Neutro Abs: 8.8 10*3/uL — ABNORMAL HIGH (ref 1.7–7.7)
Neutrophils Relative %: 71 %
Platelets: 85 10*3/uL — ABNORMAL LOW (ref 150–400)
RBC: 2.74 MIL/uL — ABNORMAL LOW (ref 4.22–5.81)
RDW: 23.4 % — ABNORMAL HIGH (ref 11.5–15.5)
WBC: 12.3 10*3/uL — ABNORMAL HIGH (ref 4.0–10.5)
nRBC: 6 % — ABNORMAL HIGH (ref 0.0–0.2)

## 2020-05-25 LAB — COMPREHENSIVE METABOLIC PANEL
ALT: 262 U/L — ABNORMAL HIGH (ref 0–44)
AST: 153 U/L — ABNORMAL HIGH (ref 15–41)
Albumin: 1.7 g/dL — ABNORMAL LOW (ref 3.5–5.0)
Alkaline Phosphatase: 140 U/L — ABNORMAL HIGH (ref 38–126)
Anion gap: 4 — ABNORMAL LOW (ref 5–15)
BUN: 69 mg/dL — ABNORMAL HIGH (ref 8–23)
CO2: 24 mmol/L (ref 22–32)
Calcium: 7.2 mg/dL — ABNORMAL LOW (ref 8.9–10.3)
Chloride: 127 mmol/L — ABNORMAL HIGH (ref 98–111)
Creatinine, Ser: 2.12 mg/dL — ABNORMAL HIGH (ref 0.61–1.24)
GFR calc Af Amer: 35 mL/min — ABNORMAL LOW (ref >60)
GFR calc non Af Amer: 30 mL/min — ABNORMAL LOW (ref >60)
Glucose, Bld: 174 mg/dL — ABNORMAL HIGH (ref 70–99)
Potassium: 4.1 mmol/L (ref 3.5–5.1)
Sodium: 155 mmol/L — ABNORMAL HIGH (ref 135–145)
Total Bilirubin: 4.2 mg/dL — ABNORMAL HIGH (ref 0.3–1.2)
Total Protein: 8.8 g/dL — ABNORMAL HIGH (ref 6.5–8.1)

## 2020-05-25 LAB — GLUCOSE, CAPILLARY
Glucose-Capillary: 162 mg/dL — ABNORMAL HIGH (ref 70–99)
Glucose-Capillary: 168 mg/dL — ABNORMAL HIGH (ref 70–99)
Glucose-Capillary: 147 mg/dL — ABNORMAL HIGH (ref 70–99)
Glucose-Capillary: 118 mg/dL — ABNORMAL HIGH (ref 70–99)
Glucose-Capillary: 126 mg/dL — ABNORMAL HIGH (ref 70–99)
Glucose-Capillary: 142 mg/dL — ABNORMAL HIGH (ref 70–99)

## 2020-05-25 LAB — PHENYTOIN LEVEL, FREE AND TOTAL
Phenytoin, Free: 2.7 ug/mL — ABNORMAL HIGH (ref 1.0–2.0)
Phenytoin, Total: 12.1 ug/mL (ref 10.0–20.0)

## 2020-05-25 LAB — AMMONIA: Ammonia: 71 umol/L — ABNORMAL HIGH (ref 9–35)

## 2020-05-25 LAB — PATHOLOGIST SMEAR REVIEW

## 2020-05-25 MED ORDER — FENTANYL CITRATE (PF) 100 MCG/2ML IJ SOLN
200.0000 ug | Freq: Once | INTRAMUSCULAR | Status: AC
  Administered 2020-05-26: 100 ug via INTRAVENOUS
  Filled 2020-05-25: qty 4

## 2020-05-25 MED ORDER — PROPOFOL 10 MG/ML IV BOLUS
0.5000 mg/kg | Freq: Once | INTRAVENOUS | Status: AC
  Administered 2020-05-26: 25 ug via INTRAVENOUS
  Filled 2020-05-25: qty 20

## 2020-05-25 MED ORDER — LACTATED RINGERS IV BOLUS
500.0000 mL | Freq: Once | INTRAVENOUS | Status: AC
  Administered 2020-05-25: 500 mL via INTRAVENOUS

## 2020-05-25 MED ORDER — SODIUM CHLORIDE 0.9% IV SOLUTION: Freq: Once | INTRAVENOUS | Status: DC

## 2020-05-25 MED ORDER — MIDAZOLAM HCL 2 MG/2ML IJ SOLN
5.0000 mg | Freq: Once | INTRAMUSCULAR | Status: AC
  Administered 2020-05-26: 6 mg via INTRAVENOUS
  Filled 2020-05-25: qty 6

## 2020-05-25 MED ORDER — VECURONIUM BROMIDE 10 MG IV SOLR
10.0000 mg | Freq: Once | INTRAVENOUS | Status: AC
  Administered 2020-05-26: 10 mg via INTRAVENOUS
  Filled 2020-05-25: qty 10

## 2020-05-25 MED ORDER — TRANEXAMIC ACID-NACL 1000-0.7 MG/100ML-% IV SOLN
1000.0000 mg | INTRAVENOUS | Status: AC
  Administered 2020-05-26: 1000 mg via INTRAVENOUS
  Filled 2020-05-25 (×4): qty 100

## 2020-05-25 NOTE — Progress Notes (Signed)
Physical Therapy Treatment Patient Details Name: Louis Ford. MRN: [id number] DOB: 29-Jul-1946 Today's Date: 05/25/2020    History of Present Illness 74 y.o. male presented to ED on 5//21 after ground level fall landing on his buttocks. MRI revealed hematoma causing spinal compression in thoracic spine and a T6 compression fx. PMH significant for A.fib not on blood thinners, hx of retroperitoneal bleed, ischemic cardiomyopathy EF 15-20%, hx of CVA, seizures, dementia. Pt also with bleeding disorder that has not been characterized. Pt underwent thoracic laminectomy for hematoma evac on 5/22, remaining intubated after procedure. Pt underwent T4-T8 posterior fixation on 5/24. Pt extubated on 5/26, reintubated on 5/27.    PT Comments    Seen in coordination with OT for mobilizing up in chair position in bed for promoting arousal and to further assess mobility.  Moving head around and opening eyes some during session even nodding in response to commands, but unable to follow through with commands.  Did finally withdraw to painful stimuli on the L UE/LE after 5 second delay, but no response on the R.  Patient will need SNF level rehab upon d/c.  PT to follow as tolerated.    Follow Up Recommendations  SNF     Equipment Recommendations  None recommended by PT    Recommendations for Other Services       Precautions / Restrictions Precautions Precautions: Fall Precaution Comments: no longer has defibrillator, on vent Restrictions Weight Bearing Restrictions: No    Mobility  Bed Mobility Overal bed mobility: Needs Assistance             General bed mobility comments: total A to scoot up in bed, then used bed egress to chair position today with use of pillows to bolster/support in more upright position  Transfers                 General transfer comment: unable to attempt due to level of arousal  Ambulation/Gait                 Stairs             Wheelchair  Mobility    Modified Rankin (Stroke Patients Only)       Balance Overall balance assessment: Needs assistance   Sitting balance-Leahy Scale: Poor Sitting balance - Comments: leaning forward from back of bed with bed in chair position with mod A of 2; pt held his head up but tends to lean to R Postural control: Right lateral lean                                  Cognition Arousal/Alertness: Lethargic Behavior During Therapy: Flat affect Overall Cognitive Status: Difficult to assess                                 General Comments: pt briefly opening eyes when cued and once positioned more upright, nodding head to some basic questions/instructions but not following through with command follow, delayed response to painful stimuli       Exercises Other Exercises Other Exercises: PROM to bil UE and bil LEs Other Exercises: gentle retrograde massage to bil UE to aide in edema reduction, bil UE elevated end of session     General Comments General comments (skin integrity, edema, etc.): HR in 120's during session, pt on PRVC mode on vent 40% FiO2  PEEP 5      Pertinent Vitals/Pain Pain Assessment: Faces Faces Pain Scale: Hurts a little bit Pain Location: general discomfort with noxious/painful stimuli Pain Descriptors / Indicators: Grimacing Pain Intervention(s): Monitored during session    Home Living                      Prior Function            PT Goals (current goals can now be found in the care plan section) Acute Rehab PT Goals Patient Stated Goal: per wife for husband to get better Progress towards PT goals: Not progressing toward goals - comment(limited by arousal and now on vent)    Frequency    Min 3X/week      PT Plan Current plan remains appropriate    Co-evaluation PT/OT/SLP Co-Evaluation/Treatment: Yes Reason for Co-Treatment: Complexity of the patient's impairments (multi-system involvement);Necessary to  address cognition/behavior during functional activity;For patient/therapist safety PT goals addressed during session: Strengthening/ROM;Balance OT goals addressed during session: ADL's and self-care;Strengthening/ROM      AM-PAC PT "6 Clicks" Mobility   Outcome Measure  Help needed turning from your back to your side while in a flat bed without using bedrails?: Total Help needed moving from lying on your back to sitting on the side of a flat bed without using bedrails?: Total Help needed moving to and from a bed to a chair (including a wheelchair)?: Total Help needed standing up from a chair using your arms (e.g., wheelchair or bedside chair)?: Total Help needed to walk in hospital room?: Total Help needed climbing 3-5 steps with a railing? : Total 6 Click Score: 6    End of Session Equipment Utilized During Treatment: Other (comment)(vent) Activity Tolerance: Patient limited by lethargy Patient left: in bed;with call bell/phone within reach;with bed alarm set(in chair position)   PT Visit Diagnosis: Other abnormalities of gait and mobility (R26.89);Muscle weakness (generalized) (M62.81);Other symptoms and signs involving the nervous system (R29.898)     Time: [id number] PT Time Calculation (min) (ACUTE ONLY): 26 min  Charges:  $Therapeutic Activity: 8-22 mins                     Magda Kiel, PT Acute Rehabilitation Services [phone] 05/25/2020    Reginia Naas 05/25/2020, 4:15 PM

## 2020-05-25 NOTE — Progress Notes (Signed)
Occupational Therapy Treatment Patient Details Name: [facility]. MRN: [id number] DOB: 1946/04/19 Today's Date: 05/25/2020    History of present illness 74 y.o. male presented to ED on 5//21 after ground level fall landing on his buttocks. MRI revealed hematoma causing spinal compression in thoracic spine and a T6 compression fx. PMH significant for A.fib not on blood thinners, hx of retroperitoneal bleed, ischemic cardiomyopathy EF 15-20%, hx of CVA, seizures, dementia. Pt also with bleeding disorder that has not been characterized. Pt underwent thoracic laminectomy for hematoma evac on 5/22, remaining intubated after procedure. Pt underwent T4-T8 posterior fixation on 5/24. Pt extubated on 5/26, reintubated on 5/27.   OT comments  Pt with slow progress towards OT goals, now re-intubated. Use of bed egress to chair position today to attempt to increase pt arousal/ellicit pt response. Pt briefly opening eyes to voice/command when more upright though noted with no tracking towards voice; he will occasionally nod his head to basic question/commands but with no carryover of command follow. Provided additional retrograde massage to bil UEs and elevated end of session as pt with edema throughout. VSS on vent (40% fiO2 PEEP 5). Will continue per POC at this time.   Follow Up Recommendations  SNF;LTACH;Supervision/Assistance - 24 hour    Equipment Recommendations  3 in 1 bedside commode    Recommendations for Other Services      Precautions / Restrictions Precautions Precautions: Fall Precaution Comments: no longer has defibrillator Restrictions Weight Bearing Restrictions: No       Mobility Bed Mobility Overal bed mobility: Needs Assistance             General bed mobility comments: bed egress to chair position today with use of pillows to bolster/support in more upright position   Transfers                      Balance                                            ADL either performed or assessed with clinical judgement   ADL Overall ADL's : Needs assistance/impaired                                       General ADL Comments: total A at this time     Vision       Perception     Praxis      Cognition Arousal/Alertness: Lethargic Behavior During Therapy: Flat affect Overall Cognitive Status: Difficult to assess                                 General Comments: pt briefly opening eyes when cued and once positioned more upright, nodding head to some basic questions/instructions but not following through with command follow, delayed response to painful stimuli         Exercises Exercises: Other exercises Other Exercises Other Exercises: PROM to bil UE and bil LEs Other Exercises: gentle retrograde massage to bil UE to aide in edema reduction, bil UE elevated end of session    Shoulder Instructions       General Comments      Pertinent Vitals/ Pain       Pain Assessment:  Faces Faces Pain Scale: Hurts a little bit Pain Location: general discomfort with noxious/painful stimuli Pain Descriptors / Indicators: Grimacing Pain Intervention(s): Monitored during session  Home Living                                          Prior Functioning/Environment              Frequency  Min 2X/week        Progress Toward Goals  OT Goals(current goals can now be found in the care plan section)  Progress towards OT goals: OT to reassess next treatment  Acute Rehab OT Goals Patient Stated Goal: per wife for husband to get better OT Goal Formulation: With family Time For Goal Achievement: 2020-06-11 Potential to Achieve Goals: Fair ADL Goals Pt Will Perform Grooming: with mod assist;sitting Pt Will Perform Upper Body Bathing: with mod assist;sitting Additional ADL Goal #1: Pt will demonstrate emergent awareness during functional task in non-distractin  environment Additional ADL Goal #2: Pt will maintain midline postural control EOB with mod A +2  Plan Discharge plan remains appropriate    Co-evaluation    PT/OT/SLP Co-Evaluation/Treatment: Yes Reason for Co-Treatment: Complexity of the patient's impairments (multi-system involvement);Necessary to address cognition/behavior during functional activity;To address functional/ADL transfers;For patient/therapist safety   OT goals addressed during session: ADL's and self-care;Strengthening/ROM      AM-PAC OT "6 Clicks" Daily Activity     Outcome Measure   Help from another person eating meals?: Total Help from another person taking care of personal grooming?: Total Help from another person toileting, which includes using toliet, bedpan, or urinal?: Total Help from another person bathing (including washing, rinsing, drying)?: Total Help from another person to put on and taking off regular upper body clothing?: Total Help from another person to put on and taking off regular lower body clothing?: Total 6 Click Score: 6    End of Session Equipment Utilized During Treatment: Oxygen  OT Visit Diagnosis: Other abnormalities of gait and mobility (R26.89);Muscle weakness (generalized) (M62.81);Other symptoms and signs involving cognitive function   Activity Tolerance Patient limited by lethargy;Patient tolerated treatment well   Patient Left in bed;with call bell/phone within reach   Nurse Communication Mobility status(pt remainining in chair position)        Time: [id number] OT Time Calculation (min): 26 min  Charges: OT General Charges $OT Visit: 1 Visit OT Treatments $Self Care/Home Management : 8-22 mins  Lou Cal, OT Acute Rehabilitation Services Pager [phone] Office [phone]    Raymondo Band 05/25/2020, 3:52 PM

## 2020-05-25 NOTE — Progress Notes (Signed)
Patient ID: Louis Dandy., male   DOB: Jul 21, 1946, 74 y.o.   MRN: [id number]  BP 91/70   Pulse (!) 119   Temp 98.8 F (37.1 C) (Axillary)   Resp 18   Ht 6\' 2"  (1.88 m) Comment: Measured by RT  Wt 98.1 kg   SpO2 100%   BMI 27.77 kg/m   Not following commands, opens eyes to voice Remains encephalopathic No real improvement Will undergo tracheostomy tomorrow. Wound is clean, dry

## 2020-05-25 NOTE — Progress Notes (Addendum)
Pt RR 50, sweating, tachycardic, vent alarming. Fentanyl 100 mcg early to prevent pt harm.   At 1655, pt RR 19, less sweating, HR 117, appears more comfortable.

## 2020-05-25 NOTE — Progress Notes (Signed)
NAME:  Louis Ford., MRN:  [id number], DOB:  08/12/1946, LOS: 12 ADMISSION DATE:  05/15/2020, CONSULTATIO care unit N DATE:  05/25/20 REFERRING MD:  Neurosurgery, CHIEF COMPLAINT:   Fall  Brief History   74 year old male who presented to the emergency department 5/21 after a fall while walking to the bathroom was found to have pathologic compression fracture of T6 and large volume spinal epidural hemorrhage requiring hematoma evacuation and thoracic laminectomy.  He was intubated for procedure and transferred to the intensive care unit postop.  History of present illness   74 year old male with a history of hypertension, hyperlipidemia, CAD s/p CABG 2011, ischemic cardiomyopathy HFrEF, AICD, atrial fibrillation, CVA, seizure disorder, factor VII deficiency, not clearly diagnosed bleeding diathesis despite extensive work-up who was walking to the bathroom with his walker when he lost his balance and fell to seated position after his legs just gave out on him.  He was having significant pain on presentation to the ED and was found to have pathologic compression fracture of T6  large volume of Spinal Epidural Hemorrhage spanning from the cervical spine throughout the thoracic spine to the conus.  Widespread spinal cord mass-effect.  Neurosurgery and hematology evaluated, recommendation for 3 units FFP, 1 unit of platelets and 1 g of TXA  preprocedure, 1 unit platelets, 1 g TXA post procedure with additional FFP depending on volume status.  Neurosurgery performed T6 laminectomy and epidural clot evacuation.  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   Significant Hospital Events   5/21 admit to neurosurgery 5/22 to ICU postop 5/24 - OR for T4-T8 arthrodesis. T6 found to be infiltrated with tumor  Consults:  PCCM Hematology  Procedures:  5/22 thoracic laminectomy  Significant Diagnostic Tests:  05/22/2020 MR Brain wo Contrast: Negative for acute infarct Chronic hemorrhage in the left insula, left medial temporal lobe, and in the subarachnoid space. Etiology of the hemorrhage is not apparent. No mass lesion identified.  5/30 MR Spine 10 mm lesion T1 vertebral body and pedicle on the left compatible with myeloma or metastatic disease. No cervical bone lesion. Multilevel degenerative changes in the cervical spine causing spinal and foraminal stenosis as above Posterior epidural hematoma extending to C6-7 from the thoracic spine. This is not causing compression of the visualized spinal cord. This was present on the thoracic MRI. There are postsurgical changes in the posterior soft tissues thoracic spine due to recent surgery.  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 5/28 IMPRESSION: 1. Stable remote infarct of the left posterior insular cortex and left superior medial temporal lobe. 2. Stable atrophy and white matter disease. 3. No acute intracranial abnormality or significant interval change.  Micro Data:  5/21 MRSA screen>> negative 5/21 SARS-Cov-2>> negative 5/21 urine culture>> serratia marcescens  5/28 endotracheal culture >> pseudomonas aeruginosa   Antimicrobials:  Ceftriaxone 5/21 Zosyn 5/27>>  Interim history/subjective:  Encephalopathic Not following commands Some movement to voice, though movement minimal. Still not withdrawing to pain.   Objective   Blood pressure 92/75, pulse (!) 109, temperature 98.6 F (37 C), temperature source Axillary, resp. rate 18, height '6\' 2"'$  (1.88 m), weight 98.1 kg, SpO2 100 %.    Vent Mode: PRVC FiO2 (%):  [40 %] 40 % Set Rate:  [18 bmp] 18 bmp Vt Set:  [650 mL] 650 mL PEEP:  [5 cmH20] 5 cmH20 Plateau Pressure:  [19 cmH20-30 cmH20] 21 cmH20   Intake/Output Summary (Last 24 hours) at 05/25/2020 0744 Last data filed at 05/25/2020 0700 Gross per 24 hour  Intake 3498.46 ml  Output 2300 ml  Net 1198.46 ml   Filed Weights   05/21/20 0500 05/24/20 0500 05/25/20 0500  Weight: 97.7 kg 98.2 kg 98.1 kg    GEN: man on ventilator, unresponsive  HEENT: MM dry, PERRLA CV: tachycardic, irregular rhythm, 1+ pulses PULM: crackles b/l GI: BS+, non-distended, soft EXT: mildly edematous NEURO: non-responsive to pain, touch and sound, PERRLA, cough reflex intact, some spontaneous movement when attempting to test oculocephalic reflex PSYCH: RASS -3 SKIN: no rashes, lesions or breakdown   Resolved Hospital Problem list     Assessment & Plan:   Critically ill due to respiratory failure  requiring mechanical ventilation. Plan Continue mechanical ventilation, SBT as tolerated  Target TVol 6-8cc/kgIBW Target Plateau Pressure < 30cm H20 Target driving pressure less than 15 cm of water Ventilator associated pneumonia prevention protocol Plan for tracheostomy given slow recovery, calling wife for formal consent today   Persistent but improved patchy bilateral opacities. Decreased pleural effusions Sputum Cx + for pseudomonas aeruginosa  Plan Continue  Zosyn (day 6 of 7) CXR prn Culture as is clinically appropriate Trend fever/ WBC  Toxic metabolic encephalopathy which is mildly improving.  No seizures on EEG No sedation MRI Brain negative for acute infarct, some chronic hemorrhage  Minimize sedative medications. Elevated ammonia level-being corrected-95 Allow time for hypoactive delirium to clear.  Hyperammonemia-currently 95 Continue to trend Continue lactulose Continue Xifaxan  Hypernatremia Remains 152 Continue  free water  Will increase free water to Q 4 D5 water bolus prn  Elevated LFTs: currently downtrending Likely due to seizure medication, reassuring that it is downtrending Continue to trend Consider RUQ U/S   Fall with pathologic T6 fracture and epidural hematoma causing cord compression.  Status post T6 laminectomy and spine stabilization 5/25. Myeloma related with IgG Kappa spike - Oncology is seeing patient. 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 Irene Limbo and at [facility] by [name] -Work-up for possible multiple myeloma per hematology -pathology pending -Consider TXA infusion if requires further OR -Empirically transfuse FFP. -Consider tag to better define clotting disorder. - Trend CBC - Transfuse for HGB < 7  History  of seizure disorder Sound like  both tonic-clonic, followed by neurology Continue Keppra and Dilantin per [place]  HFrEF, CAD s/p CABG, AICD, atrial fibrillation, hypertension, hyperlipidemia RVR, rate 130 On Levophed at 15 mcg Last echocardiogram in 03/2020 with EF of 30 to 35%, not chronically anti-coagulated given bleeding disorder - Amiodarone - Prn Metoprolol for additional rate control - Wean Levophed for MAP > 65 - Continue Lasix, monitor volume status and I&O's    Daily Goals Checklist  Pain/Anxiety/Delirium protocol (if indicated): stopped all except oxycodone. Keep off sedative infusions if possible Neuro vitals: every 2 hours AED's: Continue home AED's VAP protocol (if indicated): bundle in place, chest PT Respiratory support goals: Full support. SBT once mental status improves. Blood pressure target: Allow autoregulation. DVT prophylaxis: SCD's only due to bleeding diathesis.  Nutrition Status: Cortrak in place GI prophylaxis: pantoprazole Fluid status goals: allow autoregulation.  Urinary catheter: Condom Central lines: L IJ CVC Glucose control: adequate control of T2DM with SSI. Mobility/therapy needs: Bedrest then PT once extubated.  Antibiotic de-escalation: Zosyn for 7 days to cover HCAP Home medication reconciliation:  Home AED, HF meds on hold Daily labs: CBC, BMP Code Status: Full  Family Communication: called at updated wife Disposition: ICU    Labs   CBC: Recent Labs  Lab 05/21/20 0538 05/22/20 0515 05/23/20 0507 05/24/20 0525 05/25/20 0544  WBC 8.6 11.2* 12.4* 12.7* 12.3*  NEUTROABS 6.5 7.9* 8.3* 9.2* 8.8*  HGB 9.1* 8.9* 8.7* 8.4* 8.1*  HCT 29.5* 28.4* 27.6* 26.6* 25.6*  MCV 93.7 93.1 93.9 94.0 93.4  PLT 107* 97* 96* 86* 85*    Basic Metabolic Panel: Recent Labs  Lab 05/20/20 0141 05/20/20 1440 05/20/20 1618 05/20/20 1741 05/21/20 0538 05/21/20 1745 05/22/20 0515 05/23/20 0507 05/24/20 0525  NA   < >  --   --  146* 148*  --  152* 152* 154*  K   < >  --   --   5.5* 3.7  --  3.5 4.0 3.9  CL  --   --   --  117* 119*  --  124* 124* 123*  CO2  --   --   --  16* 21*  --  _0 GLUCOSE  --   --   --  160* 194*  --  190* 193* 251*  BUN  --   --   --  49* 52*  --  55* 55* 63*  CREATININE  --   --   --  2.67* 2.36*  --  2.03* 2.02* 2.09*  CALCIUM  --   --   --  7.8* 7.5*  --  7.4* 6.9* 7.0*  MG  --  1.8 2.0  --  1.9 2.0  --   --  2.2  PHOS  --  4.2 4.3  --  2.1* 1.7*  --   --   --    < > = values in this interval not displayed.     Clarise Cruz Myriah Boggus, MS4      05/25/2020    7:44 AM

## 2020-05-26 ENCOUNTER — Inpatient Hospital Stay (HOSPITAL_COMMUNITY): Payer: Medicare Other

## 2020-05-26 DIAGNOSIS — Z978 Presence of other specified devices: Secondary | ICD-10-CM

## 2020-05-26 LAB — GLUCOSE, CAPILLARY
Glucose-Capillary: 89 mg/dL (ref 70–99)
Glucose-Capillary: 57 mg/dL — ABNORMAL LOW (ref 70–99)
Glucose-Capillary: 64 mg/dL — ABNORMAL LOW (ref 70–99)
Glucose-Capillary: 87 mg/dL (ref 70–99)
Glucose-Capillary: 84 mg/dL (ref 70–99)
Glucose-Capillary: 82 mg/dL (ref 70–99)
Glucose-Capillary: 81 mg/dL (ref 70–99)
Glucose-Capillary: 112 mg/dL — ABNORMAL HIGH (ref 70–99)

## 2020-05-26 LAB — CBC
HCT: 26.6 % — ABNORMAL LOW (ref 39.0–52.0)
Hemoglobin: 8.2 g/dL — ABNORMAL LOW (ref 13.0–17.0)
MCH: 29.4 pg (ref 26.0–34.0)
MCHC: 30.8 g/dL (ref 30.0–36.0)
MCV: 95.3 fL (ref 80.0–100.0)
Platelets: 130 10*3/uL — ABNORMAL LOW (ref 150–400)
RBC: 2.79 MIL/uL — ABNORMAL LOW (ref 4.22–5.81)
RDW: 24.4 % — ABNORMAL HIGH (ref 11.5–15.5)
WBC: 11.1 10*3/uL — ABNORMAL HIGH (ref 4.0–10.5)
nRBC: 7.9 % — ABNORMAL HIGH (ref 0.0–0.2)

## 2020-05-26 LAB — CBC WITH DIFFERENTIAL/PLATELET
Abs Immature Granulocytes: 0 10*3/uL (ref 0.00–0.07)
Band Neutrophils: 1 %
Basophils Absolute: 0 10*3/uL (ref 0.0–0.1)
Basophils Relative: 0 %
Eosinophils Absolute: 0.7 10*3/uL — ABNORMAL HIGH (ref 0.0–0.5)
Eosinophils Relative: 6 %
HCT: 25.2 % — ABNORMAL LOW (ref 39.0–52.0)
Hemoglobin: 8 g/dL — ABNORMAL LOW (ref 13.0–17.0)
Lymphocytes Relative: 14 %
Lymphs Abs: 1.6 10*3/uL (ref 0.7–4.0)
MCH: 29 pg (ref 26.0–34.0)
MCHC: 31.7 g/dL (ref 30.0–36.0)
MCV: 91.3 fL (ref 80.0–100.0)
Monocytes Absolute: 0.7 10*3/uL (ref 0.1–1.0)
Monocytes Relative: 6 %
Neutro Abs: 8.4 10*3/uL — ABNORMAL HIGH (ref 1.7–7.7)
Neutrophils Relative %: 73 %
Platelets: 102 10*3/uL — ABNORMAL LOW (ref 150–400)
RBC: 2.76 MIL/uL — ABNORMAL LOW (ref 4.22–5.81)
RDW: 23.7 % — ABNORMAL HIGH (ref 11.5–15.5)
WBC: 11.3 10*3/uL — ABNORMAL HIGH (ref 4.0–10.5)
nRBC: 7.5 % — ABNORMAL HIGH (ref 0.0–0.2)
nRBC: 8 /100 WBC — ABNORMAL HIGH

## 2020-05-26 LAB — POCT I-STAT 7, (LYTES, BLD GAS, ICA,H+H)
Acid-base deficit: 6 mmol/L — ABNORMAL HIGH (ref 0.0–2.0)
Bicarbonate: 17.6 mmol/L — ABNORMAL LOW (ref 20.0–28.0)
Calcium, Ion: 1.21 mmol/L (ref 1.15–1.40)
HCT: 29 % — ABNORMAL LOW (ref 39.0–52.0)
Hemoglobin: 9.9 g/dL — ABNORMAL LOW (ref 13.0–17.0)
O2 Saturation: 93 %
Patient temperature: 97.8
Potassium: 4.7 mmol/L (ref 3.5–5.1)
Sodium: 162 mmol/L (ref 135–145)
TCO2: 18 mmol/L — ABNORMAL LOW (ref 22–32)
pCO2 arterial: 28 mmHg — ABNORMAL LOW (ref 32.0–48.0)
pH, Arterial: 7.405 (ref 7.350–7.450)
pO2, Arterial: 64 mmHg — ABNORMAL LOW (ref 83.0–108.0)

## 2020-05-26 LAB — PROTIME-INR
INR: 1.9 — ABNORMAL HIGH (ref 0.8–1.2)
Prothrombin Time: 21.3 seconds — ABNORMAL HIGH (ref 11.4–15.2)

## 2020-05-26 LAB — ALBUMIN: Albumin: 1.6 g/dL — ABNORMAL LOW (ref 3.5–5.0)

## 2020-05-26 LAB — PHENYTOIN LEVEL, TOTAL: Phenytoin Lvl: 12.8 ug/mL (ref 10.0–20.0)

## 2020-05-26 MED ORDER — VECURONIUM BROMIDE 10 MG IV SOLR
INTRAVENOUS | Status: AC
  Filled 2020-05-26: qty 10

## 2020-05-26 MED ORDER — DEXTROSE 50 % IV SOLN
INTRAVENOUS | Status: AC
  Filled 2020-05-26: qty 50

## 2020-05-26 MED ORDER — ADULT MULTIVITAMIN W/MINERALS CH
1.0000 | ORAL_TABLET | Freq: Every day | ORAL | Status: DC
  Administered 2020-05-26 – 2020-06-01 (×7): 1
  Filled 2020-05-26 (×7): qty 1

## 2020-05-26 MED ORDER — DEXTROSE 50 % IV SOLN
12.5000 g | INTRAVENOUS | Status: AC
  Administered 2020-05-26: 12.5 g via INTRAVENOUS

## 2020-05-26 NOTE — Procedures (Signed)
Bedside Tracheostomy Insertion Procedure Note   Patient Details:   Name: [facility]. DOB: [birth year] MRN: [id number]  Procedure: Tracheostomy  Pre Procedure Assessment: ET Tube Size:7.5 ET Tube secured at lip (cm):26 Bite block in place: No Breath Sounds: Diminished and Rhonch  Post Procedure Assessment: BP 103/78   Pulse (!) 106   Temp (!) 97.4 F (36.3 C) (Axillary)   Resp 18   Ht 6\' 2"  (1.88 m) Comment: Measured by RT  Wt 100.6 kg   SpO2 100%   BMI 28.48 kg/m  O2 sats: stable throughout Complications: No apparent complications Patient did tolerate procedure well Tracheostomy Brand:Shiley Tracheostomy Style:Cuffed Tracheostomy Size: 6 Tracheostomy Secured [id number] & Sutures Tracheostomy Placement Confirmation:Trach cuff visualized and in place and Chest X ray ordered for placement    Ciro Backer 05/26/2020, 2:30 PM

## 2020-05-26 NOTE — Procedures (Signed)
Intubation Procedure Note Louis Ford [id number] [birth year]  Procedure: Intubation Indications: Respiratory insufficiency  Procedure Details Consent: Unable to obtain consent because of emergent medical necessity. Time Out: Verified patient identification, verified procedure, site/side was marked, verified correct patient position, special equipment/implants available, medications/allergies/relevent history reviewed, required imaging and test results available.  Performed  Maximum sterile technique was used including antiseptics, cap, gloves, gown, hand hygiene, mask and sheet.  MAC and 4    Evaluation Hemodynamic Status: BP stable throughout; O2 sats: stable throughout Patient's Current Condition: stable Complications: No apparent complications Patient did tolerate procedure well. Chest X-ray ordered to verify placement.  CXR: pending.   Clementeen Graham 05/26/2020 Erick Colace ACNP-BC [place] Pager # [phone] OR # [phone] if no answer

## 2020-05-26 NOTE — Procedures (Signed)
Bronchoscopy Procedure Note Hezron Brodeur. [id number] March 22, 1946  Procedure: Bronchoscopy   Procedure done by Jerrye Bushy ACNP-BC,. At first bronch was introduce through ET tube and structures of tracheal rings, carina identified for operator of tracheostomy who was Dr Lynetta Mare . Light of bronch passed through trachea and skin for indentification of tracheal rings for tracheostomy puncture.. During this time the ET did get dislodged (see intubation note) The patient was re-intubated and above repeated After this, under bronchoscopy guidance,  ET tube was pulled back sufficiently and very carefully. The ET tube was  pulled back enough to give room for tracheostomy operator and yet at same time to to ensure a secured airway After this was accomplished, bronchoscope was withdrawn into the ET tube. After this,  Dr Lynetta Mare  then performed tracheostomy under video visual provided by flexible video bronchoscopy. Followng introduction of tracheostomy,  the bronchoscope was removed from ET tube and introduced through tracheostomy. Correct position of tracheostomy was ensured, with enough room between carina and distal tracheostomy and no evidence of bleeding. The bronchoscope was then withdrawn. Respiratory therapist was then instructed to remove the ET tube.  Dr Doyne Keel then proceeded to complete the tracheostomy with stay sutures   No complications   Erick Colace ACNP-BC [place] Pager # [phone] OR # [phone] if no answer

## 2020-05-26 NOTE — Progress Notes (Signed)
Patient ID: Louis Ford., male   DOB: 1946-04-22, 74 y.o.   MRN: [id number] BP 125/80   Pulse (!) 127   Temp (!) 97.5 F (36.4 C) (Axillary)   Resp (!) 22   Ht 6\' 2"  (1.88 m) Comment: Measured by RT  Wt 100.6 kg   SpO2 100%   BMI 28.48 kg/m  Not following commands perrl No neurological changes Tracheostomy performed today

## 2020-05-26 NOTE — Progress Notes (Addendum)
Nutrition Follow-up  DOCUMENTATION CODES:   Not applicable  INTERVENTION:   Pivot 1.5 @ 60 ml/hr via Cortrak tube  Provides: 2160 kcal, 135 grams protein, and 1092 ml free water.   300 ml free water every 4 hours  Total free water: 2892 ml   Add MVI with minerals daily  NUTRITION DIAGNOSIS:   Increased nutrient needs related to post-op healing as evidenced by estimated needs. Ongoing.   GOAL:   Patient will meet greater than or equal to 90% of their needs Not met.   MONITOR:   Vent status, TF tolerance  REASON FOR ASSESSMENT:   Malnutrition Screening Tool    ASSESSMENT:   Pt with PMH of HTN, HLD, CAD s/p CABG, ischemic cardiomyopathy, CHF, AICD, Afib, CVA, bleeding diathesis factor VII deficiency, and sz disorder who was admitted 5/21 after falling while using walker to walk to his bathroom with compression fx of t6, large volume spinal epidural hemorrhage.   Pt discussed with RN. Per chart review. Plan for trach today, low blood sugars due to TF held after MN for trach placement.   5/21 s/p thoracic laminectomy for hematoma evacuation, spinal tumor removed; pathology pending 5/22 extubated 5/24 s/p T4-T8 arthrodesis; T6 with tumor 5/26 extubated 5/28 re-intubated early this am; plan for cortrak this am 6/3 trach   Patient is currently intubated on ventilator support MV: 12.1 L/min Temp (24hrs), Avg:98.6 F (37 C), Min:97.8 F (36.6 C), Max:99.3 F (37.4 C)   Medications reviewed and include: colace, SSI, 7 units levemir every 24 hr, lactulose QID, senokot-s Levophed @ 13 mcg  Labs reviewed: Ammonia: 71 (H), Na 155 (H)  Weight up from admission  Pt is 17 L positive with mild pitting edema  Diet Order:   Diet Order            Diet NPO time specified  Diet effective midnight              EDUCATION NEEDS:   No education needs have been identified at this time  Skin:  Skin Assessment: Reviewed RN Assessment(back incision)  Last BM:  680 ml  via rectal tube - on lactulose  Height:   Ht Readings from Last 1 Encounters:  05/18/2020 _0  (1.88 m)    Weight:   Wt Readings from Last 1 Encounters:  05/26/20 100.6 kg    Ideal Body Weight:  78.1 kg  BMI:  Body mass index is 28.48 kg/m.  Estimated Nutritional Needs:   Kcal:  2200  Protein:  130-150 grams  Fluid:  > 2 L/day  Lockie Pares., RD, LDN, CNSC See AMiON for contact information

## 2020-05-26 NOTE — Progress Notes (Signed)
eLink Physician-Brief Progress Note Patient Name: Louis Ford. DOB: July 03, 1946 MRN: [id number]   Date of Service  05/26/2020  HPI/Events of Note  Notified of increased work of breathing. Patient is s/p trach earlier today. Bloody secretions. CXR with properly positioned trach without pneumothorax.  eICU Interventions  Given Fentanyl with some relief. Get ABG, CBC and BMP. eLink to be informed as soon as results are out.     Intervention Category Intermediate Interventions: Respiratory distress - evaluation and management  Judd Lien 05/26/2020, 10:52 PM

## 2020-05-26 NOTE — Progress Notes (Signed)
NAME:  Louis Ford., MRN:  [id number], DOB:  [birth year], LOS: 63 ADMISSION DATE:  05/22/2020, CONSULTATIO care unit N DATE:  05/26/20 REFERRING MD:  Neurosurgery, CHIEF COMPLAINT:   Fall  Brief History   74 year old male who presented to the emergency department 5/21 after a fall while walking to the bathroom was found to have pathologic compression fracture of T6 and large volume spinal epidural hemorrhage requiring hematoma evacuation and thoracic laminectomy.  He was intubated for procedure and transferred to the intensive care unit postop.  History of present illness   74 year old male with a history of hypertension, hyperlipidemia, CAD s/p CABG 2011, ischemic cardiomyopathy HFrEF, AICD, atrial fibrillation, CVA, seizure disorder, factor VII deficiency, not clearly diagnosed bleeding diathesis despite extensive work-up who was walking to the bathroom with his walker when he lost his balance and fell to seated position after his legs just gave out on him.  He was having significant pain on presentation to the ED and was found to have pathologic compression fracture of T6  large volume of Spinal Epidural Hemorrhage spanning from the cervical spine throughout the thoracic spine to the conus.  Widespread spinal cord mass-effect.  Neurosurgery and hematology evaluated, recommendation for 3 units FFP, 1 unit of platelets and 1 g of TXA  preprocedure, 1 unit platelets, 1 g TXA post procedure with additional FFP depending on volume status.  Neurosurgery performed T6 laminectomy and epidural clot evacuation.  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   Significant Hospital Events   5/21 admit to neurosurgery 5/22 to ICU postop 5/24 - OR for T4-T8 arthrodesis. T6 found to be infiltrated with tumor  Consults:  PCCM Hematology  Procedures:  5/22 thoracic laminectomy  Significant Diagnostic Tests:  05/22/2020 MR Brain wo Contrast: Negative for acute infarct Chronic hemorrhage in the left insula, left medial temporal lobe, and in the subarachnoid space. Etiology of the hemorrhage is not apparent. No mass lesion identified.  5/30 MR Spine 10 mm lesion T1 vertebral body and pedicle on the left compatible with myeloma or metastatic disease. No cervical bone lesion. Multilevel degenerative changes in the cervical spine causing spinal and foraminal stenosis as above Posterior epidural hematoma extending to C6-7 from the thoracic spine. This is not causing compression of the visualized spinal cord. This was present on the thoracic MRI. There are postsurgical changes in the posterior soft tissues thoracic spine due to recent surgery.  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 5/28 IMPRESSION: 1. Stable remote infarct of the left posterior insular cortex and left superior medial temporal lobe. 2. Stable atrophy and white matter disease. 3. No acute intracranial abnormality or significant interval change.  Micro Data:  5/21 MRSA screen>> negative 5/21 SARS-Cov-2>> negative 5/21 urine culture>> serratia marcescens  5/28 endotracheal culture >> pseudomonas aeruginosa   Antimicrobials:  Ceftriaxone 5/21 Zosyn 5/27>> 6/3  Interim history/subjective:  Encephalopathic Not following commands Some movement to voice, though movement minimal. Still not withdrawing to pain Had to receive Fentanyl 5-6 times last night due to tachypnea and agitation.  Objective   Blood pressure 102/77, pulse (!) 125, temperature 98.3 F (36.8 C), temperature source Axillary, resp. rate 17, height _0  (1.88 m), weight 100.6 kg, SpO2 100 %.    Vent Mode: PRVC FiO2 (%):  [40 %] 40 % Set Rate:  [18 bmp] 18 bmp Vt Set:  [650 mL] 650 mL PEEP:  [5 cmH20] 5 cmH20 Plateau Pressure:  [21 cmH20-23 cmH20] 21 cmH20   Intake/Output Summary (Last 24 hours) at 05/26/2020 0910 Last data filed at 05/26/2020 0900 Gross per 24 hour  Intake 4000.29 ml  Output 2730 ml  Net 1270.29 ml   Filed Weights   05/24/20 0500 05/25/20 0500 05/26/20 0406  Weight: 98.2 kg 98.1 kg 100.6 kg    GEN: man on ventilator, unresponsive  HEENT: MM dry, PERRLA CV: tachycardic, irregular rhythm, 1+ pulses PULM: crackles b/l GI: BS+, non-distended, soft EXT: edematous NEURO: no withdrawal to pain, some movement in response to sound, cough reflex intact, some non-purposeful movement of head and neck noted  PSYCH: RASS -3 SKIN: no rashes, lesions or breakdown   Resolved Hospital Problem list      Assessment & Plan:   Critically ill due to respiratory failure requiring mechanical ventilation. Plan Tracheostomy today given slow recovery  Continue mechanical ventilation, SBT as tolerated  Target TVol 6-8cc/kgIBW Target Plateau Pressure < 30cm H20 Target driving pressure less than 15 cm of water Ventilator associated pneumonia prevention protocol  Persistent but improved patchy bilateral opacities. Decreased pleural effusions Sputum Cx + for pseudomonas aeruginosa  Plan Continue  Zosyn (day 7 of 7) CXR prn Culture as is clinically appropriate Trend fever/ WBC  Toxic metabolic encephalopathy which is mildly improving.  No seizures on EEG No sedation MRI Brain negative for acute infarct, some chronic hemorrhage  Minimize sedative medications. Elevated ammonia level-being corrected Once trach is in place, will be able to cease all sedating medications   Hyperammonemia Continue to trend Continue lactulose Continue Xifaxan  Hypernatremia Remains 155 Continue  free water  Will increase free water to Q 4 D5 water bolus prn  Elevated LFTs: currently downtrending Likely due to seizure medication, reassuring that it is downtrending Continue to trend Consider RUQ U/S if increasing  Fall with pathologic T6 fracture and epidural hematoma causing cord compression.  Status post T6 laminectomy and spine stabilization 5/25. Myeloma related with IgG Kappa spike - Oncology is seeing patient. 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 Irene Limbo and at [facility] by [name] Work-up for possible multiple myeloma per hematology pathology pending TXA and FFP today before tracheostomy  Consider tag to better define clotting  disorder. Trend CBC Transfuse for HGB < 7  History of seizure  disorder Sound like both tonic-clonic, followed by neurology Continue Keppra and Dilantin per [place]  HFrEF, CAD s/p CABG, AICD, atrial fibrillation, hypertension, hyperlipidemia RVR, rate 130 On Levophed at 15 mcg Last echocardiogram in 03/2020 with EF of 30 to 35%, not chronically anti-coagulated given bleeding disorder - Amiodarone - Prn Metoprolol for additional rate control - Wean Levophed for MAP > 65 - Continue Lasix, monitor volume status and I&O's    Daily Goals Checklist  Pain/Anxiety/Delirium protocol (if indicated): stopped all except PRN Fentanyl. Keep off sedative infusions if possible Neuro vitals: every 2 hours AED's: Continue home AED's VAP protocol (if indicated): bundle in place, chest PT Respiratory support goals: Full support. SBT once mental status improves. Trach today. Blood pressure target: Allow autoregulation. DVT prophylaxis: SCD's only due to bleeding diathesis.  Nutrition Status: Cortrak in place GI prophylaxis: pantoprazole Fluid status goals: allow autoregulation.  Urinary catheter: Condom Central lines: L IJ CVC Glucose control: T2DM with SSI, Levemir  Mobility/therapy needs: Bedrest then PT once extubated.  Antibiotic de-escalation: Zosyn for 7 days to cover HCAP Home medication reconciliation:  Home AED, HF meds on hold Daily labs: CBC, BMP Code Status: Full  Family Communication: will call after procedure  Disposition: ICU    Labs   CBC: Recent Labs  Lab 05/22/20 0515 05/23/20 0507 05/24/20 0525 05/25/20 0544 05/26/20 0327  WBC 11.2* 12.4* 12.7* 12.3* 11.3*  NEUTROABS 7.9* 8.3* 9.2* 8.8* 8.4*  HGB 8.9* 8.7* 8.4* 8.1* 8.0*  HCT 28.4* 27.6* 26.6* 25.6* 25.2*  MCV 93.1 93.9 94.0 93.4 91.3  PLT 97* 96* 86* 85* 102*    Basic Metabolic Panel: Recent Labs  Lab 05/20/20 1440 05/20/20 1618 05/20/20 1741 05/21/20 0538 05/21/20 1745 05/22/20 0515 05/23/20 0507 05/24/20 0525 05/25/20 1140  NA  --   --    < > 148*  --  152* 152*  154* 155*  K  --   --    < > 3.7  --  3.5 4.0 3.9 4.1  CL  --   --    < > 119*  --  124* 124* 123* 127*  CO2  --   --    < > 21*  --  _0 GLUCOSE  --   --    < > 194*  --  190* 193* 251* 174*  BUN  --   --    < > 52*  --  55* 55* 63* 69*  CREATININE  --   --    < > 2.36*  --  2.03* 2.02* 2.09* 2.12*  CALCIUM  --   --    < > 7.5*  --  7.4* 6.9* 7.0* 7.2*  MG 1.8 2.0  --  1.9 2.0  --   --  2.2  --   PHOS 4.2 4.3  --  2.1* 1.7*  --   --   --   --    < > = values in this interval not displayed.     Clarise Cruz Sireen Halk, MS4      05/26/2020    9:10 AM

## 2020-05-26 NOTE — Procedures (Signed)
Procedure: Percutaneous Tracheostomy CPT 31600 Performed by: Dr. Kipp Brood, MD Bronchoscopy Assistant: Jerrye Bushy, NP.  Indications: Chronic respiratory failure and need for ongoing mechanical ventilation.  Consent: Signed in chart  Preprocedure: Universal protocol was followed for this procedure. Timeout performed. Anterior neck prepped and draped.  Anesthesia: The patient was intubated and sedated prior to the procedure. Additional midazolam,propofol fentanyl and vecuronium were given for sedation and paralysis with close attention to vital signs throughout procedure.   Procedure: The patient was placed in the supine position. The anterior neck was prepped and draped in usual sterile fashion. 1% lidocaine was administered approximately 2 fingerbreadths above the sternal notch for local anesthesia. A 1.5-cm vertical incision was then performed 1 fingerbreadths above the sternal notch. Using a curved Kelly, blunt dissection was performed down to the level of the pretracheal fascia. At this point, the bronchoscope was introduced through the endotracheal tube and the trachea was properly visualized. The endotracheal tube was then gradually withdrawn within the trachea under direct bronchoscopic visualization. Proper midline position was confirmed by bouncing the needle from the tracheostomy tray over the trachea with bronchoscopic examination. The needle was advanced into the trachea and proper positioning was confirmed with direct visualization. The needle was then removed leaving a white outer cannula in position. The wire from the tracheostomy tray was then advanced through the white outer cannula. The cannula was then removed. The small, blue dilator was then advanced over the wire into the trachea. Once proper dilatation was achieved, the dilator was removed. The large, tapered dilator was then advanced over the wire into the trachea. The dilator was removed leaving the wire and white inner cannula  in position. A number 6 percutaneous Shiley tracheostomy tube was then advanced over the wire and white inner cannula into the trachea. Proper positioning was confirmed with bronchoscopic visualization. The tracheostomy tube was then sutured in place with four nylon sutures. It was further secured with a tracheostomy tie.  Estimated blood loss: Less than 10 mL.  Complications: None immediate.  CXR ordered.  Kipp Brood, MD [facility] ICU Physician [place]  Pager: [phone] Mobile: [phone] After hours: [phone].  05/26/2020, 2:57 PM

## 2020-05-26 NOTE — Progress Notes (Signed)
Inpatient Diabetes Program Recommendations  AACE/ADA: New Consensus Statement on Inpatient Glycemic Control (2015)  Target Ranges:  Prepandial:   less than 140 mg/dL      Peak postprandial:   less than 180 mg/dL (1-2 hours)      Critically ill patients:  140 - 180 mg/dL   Results for GLEEN, NORTZ (MRN [id number]) as of 05/26/2020 08:57  Ref. Range 05/25/2020 23:28 05/26/2020 03:37 05/26/2020 08:08 05/26/2020 08:09 05/26/2020 08:38  Glucose-Capillary Latest Ref Range: 70 - 99 mg/dL 142 (H)  1 unit NOVOLOG  89 57 (L) 64 (L) 87      Current Orders: Levemir 7 units Daily      Novolog Sensitive Correction Scale/ SSI (0-9 units) Q4 hours    Pivot tube feedings stopped at 12am for Trach placement today.  Hypoglycemic this AM likely due to tube feeds being off this AM.  If this trend continues, may need to reduce Levemir     --Will follow patient during hospitalization--  Wyn Quaker RN, MSN, CDE Diabetes Coordinator Inpatient Glycemic Control Team Team Pager: [phone] (8a-5p)

## 2020-05-26 NOTE — Progress Notes (Signed)
Phenytoin Initial Consult Indication: Seizures  No Known Allergies  Patient Measurements: Height: _0  (188 cm)(Measured by RT) Weight: 100.6 kg (221 lb 12.5 oz) IBW/kg (Calculated) : 82.2 TPN AdjBW (KG): 87 Body mass index is 28.48 kg/m.   Vital signs: Temp: 97.4 F (36.3 C) (06/03 1256) Temp Source: Axillary (06/03 1256) BP: 96/78 (06/03 1500) Pulse Rate: 99 (06/03 1500)  Labs: Lab Results  Component Value Date/Time   Albumin 1.6 (L) 05/26/2020 0327   Phenytoin Lvl 12.8 05/26/2020 0327   Lab Results  Component Value Date   PHENYTOIN 12.8 05/26/2020   VALPROATE 21 (L) 05/20/2020   Estimated Creatinine Clearance: 40.5 mL/min (A) (by C-G formula based on SCr of 2.12 mg/dL (H)).   Medications:  Scheduled:  . sodium chloride   Intravenous Once  . amiodarone  200 mg Per Tube Daily  . chlorhexidine gluconate (MEDLINE KIT)  15 mL Mouth Rinse BID  . Chlorhexidine Gluconate Cloth  6 each Topical Daily  . docusate  100 mg Per Tube BID  . feeding supplement (PIVOT 1.5 CAL)  1,000 mL Per Tube Q24H  . free water  300 mL Per Tube Q4H  . insulin aspart  0-9 Units Subcutaneous Q4H  . insulin detemir  7 Units Subcutaneous Q24H  . lactulose  30 g Per Tube QID  . levETIRAcetam  1,000 mg Per Tube BID  . LORazepam  2 mg Intravenous Once  . mouth rinse  15 mL Mouth Rinse 10 times per day  . multivitamin with minerals  1 tablet Per Tube Daily  . pantoprazole sodium  40 mg Per Tube Daily  . polyethylene glycol  17 g Per Tube Daily  . rifaximin  550 mg Per Tube BID  . senna-docusate  1 tablet Per Tube BID    Assessment:  61 YOM who presented on 5/21 with fall causing epidural hematoma and spinal cord compression. The patient was on phenytoin PTA for hx seizure which was resumed on admission.  The patient was noted to have worsening mentation with elevated LFTs and ammonia on 5/28. Phenytoin levels were checked on 5/28 and 5/31 and noted to be high - likely related to the hepatic  function which would effect phenytoin clearance. Levels were again checked on 6/3 AM with a total level of 12.8 and alb 1.6 which corrects to ~30 mcg/ml and is SUPRAtherapeutic. Will hold phenytoin and check daily levels in the AM until back down into the therapeutic range - then will plan to resume at a lower dose.   Corrected phenytoin level (if needed): 30 Seizure activity: None noted Significant potential drug interactions: n/a  Goals of care:  Total phenytoin level: 10-20 mcg/ml Free phenytoin level: 1-2 mcg/ml  Plan:  - Hold Phenytoin - Obtain a phenytoin level + albumin with AM labs and then daily until the corrected level is within range - Will plan to restart at a lower dose when appropriate.   Thank you for allowing pharmacy to be a part of this patient's care.  Alycia Rossetti, PharmD, BCPS Clinical Pharmacist Clinical phone for 05/26/2020: [phone] 05/26/2020 3:19 PM   **Pharmacist phone directory can now be found on [email or web address] ([email or web address]).  Listed under [place].

## 2020-05-27 ENCOUNTER — Inpatient Hospital Stay (HOSPITAL_COMMUNITY): Payer: Medicare Other

## 2020-05-27 DIAGNOSIS — R579 Shock, unspecified: Secondary | ICD-10-CM

## 2020-05-27 DIAGNOSIS — Z9911 Dependence on respirator [ventilator] status: Secondary | ICD-10-CM

## 2020-05-27 DIAGNOSIS — G9341 Metabolic encephalopathy: Secondary | ICD-10-CM

## 2020-05-27 LAB — CBC WITH DIFFERENTIAL/PLATELET
Abs Immature Granulocytes: 0.12 10*3/uL — ABNORMAL HIGH (ref 0.00–0.07)
Basophils Absolute: 0 10*3/uL (ref 0.0–0.1)
Basophils Relative: 0 %
Eosinophils Absolute: 0.1 10*3/uL (ref 0.0–0.5)
Eosinophils Relative: 1 %
HCT: 24.1 % — ABNORMAL LOW (ref 39.0–52.0)
Hemoglobin: 7.3 g/dL — ABNORMAL LOW (ref 13.0–17.0)
Immature Granulocytes: 1 %
Lymphocytes Relative: 9 %
Lymphs Abs: 0.9 10*3/uL (ref 0.7–4.0)
MCH: 28.9 pg (ref 26.0–34.0)
MCHC: 30.3 g/dL (ref 30.0–36.0)
MCV: 95.3 fL (ref 80.0–100.0)
Monocytes Absolute: 1.2 10*3/uL — ABNORMAL HIGH (ref 0.1–1.0)
Monocytes Relative: 12 %
Neutro Abs: 7.7 10*3/uL (ref 1.7–7.7)
Neutrophils Relative %: 77 %
Platelets: 130 10*3/uL — ABNORMAL LOW (ref 150–400)
RBC: 2.53 MIL/uL — ABNORMAL LOW (ref 4.22–5.81)
RDW: 24.5 % — ABNORMAL HIGH (ref 11.5–15.5)
WBC: 10 10*3/uL (ref 4.0–10.5)
nRBC: 7.4 % — ABNORMAL HIGH (ref 0.0–0.2)

## 2020-05-27 LAB — AMMONIA: Ammonia: 77 umol/L — ABNORMAL HIGH (ref 9–35)

## 2020-05-27 LAB — BASIC METABOLIC PANEL
Anion gap: 8 (ref 5–15)
Anion gap: 9 (ref 5–15)
BUN: 62 mg/dL — ABNORMAL HIGH (ref 8–23)
BUN: 66 mg/dL — ABNORMAL HIGH (ref 8–23)
CO2: 19 mmol/L — ABNORMAL LOW (ref 22–32)
CO2: 21 mmol/L — ABNORMAL LOW (ref 22–32)
Calcium: 7.8 mg/dL — ABNORMAL LOW (ref 8.9–10.3)
Calcium: 7.4 mg/dL — ABNORMAL LOW (ref 8.9–10.3)
Chloride: 127 mmol/L — ABNORMAL HIGH (ref 98–111)
Chloride: 123 mmol/L — ABNORMAL HIGH (ref 98–111)
Creatinine, Ser: 2 mg/dL — ABNORMAL HIGH (ref 0.61–1.24)
Creatinine, Ser: 2.02 mg/dL — ABNORMAL HIGH (ref 0.61–1.24)
GFR calc Af Amer: 38 mL/min — ABNORMAL LOW (ref >60)
GFR calc Af Amer: 37 mL/min — ABNORMAL LOW (ref >60)
GFR calc non Af Amer: 33 mL/min — ABNORMAL LOW (ref >60)
GFR calc non Af Amer: 32 mL/min — ABNORMAL LOW (ref >60)
Glucose, Bld: 159 mg/dL — ABNORMAL HIGH (ref 70–99)
Glucose, Bld: 208 mg/dL — ABNORMAL HIGH (ref 70–99)
Potassium: 4.7 mmol/L (ref 3.5–5.1)
Potassium: 4.5 mmol/L (ref 3.5–5.1)
Sodium: 154 mmol/L — ABNORMAL HIGH (ref 135–145)
Sodium: 153 mmol/L — ABNORMAL HIGH (ref 135–145)

## 2020-05-27 LAB — BPAM FFP
Blood Product Expiration Date: 202106082359
ISSUE DATE / TIME: 202106031224
Unit Type and Rh: 5100

## 2020-05-27 LAB — PREPARE FRESH FROZEN PLASMA: Unit division: 0

## 2020-05-27 LAB — GLUCOSE, CAPILLARY
Glucose-Capillary: 183 mg/dL — ABNORMAL HIGH (ref 70–99)
Glucose-Capillary: 208 mg/dL — ABNORMAL HIGH (ref 70–99)
Glucose-Capillary: 212 mg/dL — ABNORMAL HIGH (ref 70–99)
Glucose-Capillary: 91 mg/dL (ref 70–99)
Glucose-Capillary: 101 mg/dL — ABNORMAL HIGH (ref 70–99)
Glucose-Capillary: 125 mg/dL — ABNORMAL HIGH (ref 70–99)

## 2020-05-27 LAB — ALBUMIN: Albumin: 1.6 g/dL — ABNORMAL LOW (ref 3.5–5.0)

## 2020-05-27 LAB — PHENYTOIN LEVEL, TOTAL: Phenytoin Lvl: 11.7 ug/mL (ref 10.0–20.0)

## 2020-05-27 MED ORDER — SODIUM CHLORIDE 0.9 % IV SOLN
INTRAVENOUS | Status: DC | PRN
  Administered 2020-05-27 – 2020-05-28 (×2): 500 mL via INTRAVENOUS

## 2020-05-27 MED ORDER — PROPOFOL 1000 MG/100ML IV EMUL
0.0000 ug/kg/min | INTRAVENOUS | Status: DC
  Administered 2020-05-27 (×2): 10 ug/kg/min via INTRAVENOUS
  Administered 2020-05-28 (×2): 25 ug/kg/min via INTRAVENOUS
  Administered 2020-05-28: 10 ug/kg/min via INTRAVENOUS
  Administered 2020-05-29 (×4): 25 ug/kg/min via INTRAVENOUS
  Administered 2020-05-30 (×2): 35 ug/kg/min via INTRAVENOUS
  Filled 2020-05-27 (×12): qty 100

## 2020-05-27 MED ORDER — NOREPINEPHRINE 16 MG/250ML-% IV SOLN
0.0000 ug/min | INTRAVENOUS | Status: DC
  Administered 2020-05-27: 30 ug/min via INTRAVENOUS
  Administered 2020-05-28: 20 ug/min via INTRAVENOUS
  Administered 2020-05-29: 6 ug/min via INTRAVENOUS
  Administered 2020-05-30: 42 ug/min via INTRAVENOUS
  Administered 2020-05-30: 35 ug/min via INTRAVENOUS
  Administered 2020-05-31 – 2020-06-01 (×6): 60 ug/min via INTRAVENOUS
  Administered 2020-06-01: 50 ug/min via INTRAVENOUS
  Administered 2020-06-01: 25 ug/min via INTRAVENOUS
  Administered 2020-06-02: 18 ug/min via INTRAVENOUS
  Administered 2020-06-02: 20 ug/min via INTRAVENOUS
  Filled 2020-05-27 (×15): qty 250

## 2020-05-27 MED ORDER — TECHNETIUM TC 99M MEBROFENIN IV KIT
5.0000 | PACK | Freq: Once | INTRAVENOUS | Status: AC | PRN
  Administered 2020-05-27: 5 via INTRAVENOUS

## 2020-05-27 MED ORDER — INSULIN ASPART 100 UNIT/ML ~~LOC~~ SOLN
2.0000 [IU] | SUBCUTANEOUS | Status: DC
  Administered 2020-05-28 – 2020-05-30 (×13): 2 [IU] via SUBCUTANEOUS

## 2020-05-27 MED ORDER — DOCUSATE SODIUM 50 MG/5ML PO LIQD: 100.0000 mg | Freq: Two times a day (BID) | ORAL | Status: DC

## 2020-05-27 MED ORDER — POLYETHYLENE GLYCOL 3350 17 G PO PACK: 17.0000 g | PACK | Freq: Every day | ORAL | Status: DC

## 2020-05-27 MED ORDER — FREE WATER
200.0000 mL | Status: DC
  Administered 2020-05-27 – 2020-05-30 (×35): 200 mL

## 2020-05-27 MED ORDER — FUROSEMIDE 10 MG/ML IJ SOLN
40.0000 mg | Freq: Once | INTRAMUSCULAR | Status: AC
  Administered 2020-05-27: 40 mg via INTRAVENOUS
  Filled 2020-05-27: qty 4

## 2020-05-27 MED ORDER — MIDAZOLAM HCL 2 MG/2ML IJ SOLN
1.0000 mg | INTRAMUSCULAR | Status: DC | PRN
  Administered 2020-05-27 – 2020-06-01 (×6): 1 mg via INTRAVENOUS
  Filled 2020-05-27 (×6): qty 2

## 2020-05-27 MED ORDER — QUETIAPINE FUMARATE 25 MG PO TABS
25.0000 mg | ORAL_TABLET | Freq: Two times a day (BID) | ORAL | Status: DC
  Administered 2020-05-27 – 2020-06-02 (×13): 25 mg
  Filled 2020-05-27 (×13): qty 1

## 2020-05-27 NOTE — Progress Notes (Signed)
Phenytoin Initial Consult Indication: Seizures  No Known Allergies  Patient Measurements: Height: _0  (188 cm)(Measured by RT) Weight: 100.6 kg (221 lb 12.5 oz) IBW/kg (Calculated) : 82.2 TPN AdjBW (KG): 87 Body mass index is 28.48 kg/m.   Vital signs: Temp: 98.6 F (37 C) (06/04 0800) Temp Source: Axillary (06/04 0800) BP: 94/78 (06/04 0826) Pulse Rate: 54 (06/04 0815)  Labs: Lab Results  Component Value Date/Time   Albumin 1.6 (L) 05/27/2020 0439   Phenytoin Lvl 11.7 05/27/2020 0439   Lab Results  Component Value Date   PHENYTOIN 11.7 05/27/2020   VALPROATE 21 (L) 05/20/2020   Estimated Creatinine Clearance: 42.5 mL/min (A) (by C-G formula based on SCr of 2.02 mg/dL (H)).   Medications:  Scheduled:  . sodium chloride   Intravenous Once  . amiodarone  200 mg Per Tube Daily  . chlorhexidine gluconate (MEDLINE KIT)  15 mL Mouth Rinse BID  . Chlorhexidine Gluconate Cloth  6 each Topical Daily  . docusate  100 mg Per Tube BID  . feeding supplement (PIVOT 1.5 CAL)  1,000 mL Per Tube Q24H  . free water  300 mL Per Tube Q4H  . insulin aspart  0-9 Units Subcutaneous Q4H  . insulin detemir  7 Units Subcutaneous Q24H  . lactulose  30 g Per Tube QID  . levETIRAcetam  1,000 mg Per Tube BID  . LORazepam  2 mg Intravenous Once  . mouth rinse  15 mL Mouth Rinse 10 times per day  . multivitamin with minerals  1 tablet Per Tube Daily  . pantoprazole sodium  40 mg Per Tube Daily  . polyethylene glycol  17 g Per Tube Daily  . rifaximin  550 mg Per Tube BID  . senna-docusate  1 tablet Per Tube BID    Assessment:  65 YOM who presented on 5/21 with fall causing epidural hematoma and spinal cord compression. The patient was on phenytoin PTA for hx seizure which was resumed on admission.  The patient was noted to have worsening mentation with elevated LFTs and ammonia on 5/28. Phenytoin levels were checked on 5/28 and 5/31 and noted to be high - likely related to the hepatic  function which would effect phenytoin clearance. Levels were again checked on 6/3 AM with a corrected level of 30 mcg/ml - doses were held at that time. A level was rechecked this morning with a total level of 11.7 and alb 1.6 which corrects to ~27.8 mcg/ml and remains SUPRAtherapeutic. Will hold phenytoin and check daily levels in the AM until back down into the therapeutic range - then will plan to resume at a lower dose.   Corrected phenytoin level (if needed): 27.8 Seizure activity: None noted Significant potential drug interactions: n/a  Goals of care:  Total phenytoin level: 10-20 mcg/ml Free phenytoin level: 1-2 mcg/ml  Plan:  - Continue to hold Phenytoin - Obtain a phenytoin level + albumin with AM labs and then daily until the corrected level is within range - Will plan to restart at a lower dose when appropriate.   Thank you for allowing pharmacy to be a part of this patient's care.  Alycia Rossetti, PharmD, BCPS Clinical Pharmacist Clinical phone for 05/27/2020: [phone] 05/27/2020 9:04 AM   **Pharmacist phone directory can now be found on [email or web address] ([email or web address]).  Listed under [place].

## 2020-05-27 NOTE — Progress Notes (Signed)
SLP Cancellation Note  Patient Details Name: [name]. MRN: [id number] DOB: September 08, 1946   Cancelled treatment:       Reason Eval/Treat Not Completed: Patient not medically ready. Patient with new tracheostomy. Orders for SLP eval and treat for PMSV and swallowing received. Will follow pt closely for readiness for SLP interventions as appropriate.   Louis Baltimore, MA CCC-SLP  Acute Rehabilitation Services Pager [phone] Office [phone]   Louis Ford 05/27/2020, 7:41 AM

## 2020-05-27 NOTE — Progress Notes (Signed)
NAME:  Louis Haggart., MRN:  [id number], DOB:  [birth year], LOS: 20 ADMISSION DATE:  05/08/2020, CONSULTATIO care unit N DATE:  05/27/20 REFERRING MD:  Neurosurgery, CHIEF COMPLAINT:   Fall  Brief History   74 year old male who presented to the emergency department 5/21 after a fall while walking to the bathroom was found to have pathologic compression fracture of T6 and large volume spinal epidural hemorrhage requiring hematoma evacuation and thoracic laminectomy.  He was intubated for procedure and transferred to the intensive care unit postop.  History of present illness   74 year old male with a history of hypertension, hyperlipidemia, CAD s/p CABG 2011, ischemic cardiomyopathy HFrEF, AICD, atrial fibrillation, CVA, seizure disorder, factor VII deficiency, not clearly diagnosed bleeding diathesis despite extensive work-up who was walking to the bathroom with his walker when he lost his balance and fell to seated position after his legs just gave out on him.  He was having significant pain on presentation to the ED and was found to have pathologic compression fracture of T6  large volume of Spinal Epidural Hemorrhage spanning from the cervical spine throughout the thoracic spine to the conus.  Widespread spinal cord mass-effect.  Neurosurgery and hematology evaluated, recommendation for 3 units FFP, 1 unit of platelets and 1 g of TXA  preprocedure, 1 unit platelets, 1 g TXA post procedure with additional FFP depending on volume status.  Neurosurgery performed T6 laminectomy and epidural clot evacuation.  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   Significant Hospital Events   5/21 admit to neurosurgery 5/22 to ICU postop 5/24 - OR for T4-T8 arthrodesis. T6 found to be infiltrated with tumor 6/3 Tracheostomy placed  Consults:  PCCM Hematology  Procedures:  5/22 thoracic laminectomy  Significant Diagnostic Tests:  05/22/2020 MR Brain wo Contrast: Negative for acute infarct Chronic hemorrhage in the left insula, left medial temporal lobe, and in the subarachnoid space. Etiology of the hemorrhage is not apparent. No mass lesion identified.  5/30 MR Spine 10 mm lesion T1 vertebral body and pedicle on the left compatible with myeloma or metastatic disease. No cervical bone lesion. Multilevel degenerative changes in the cervical spine causing spinal and foraminal stenosis as above Posterior epidural hematoma extending to C6-7 from the thoracic spine. This is not causing compression of the visualized spinal cord. This was present on the thoracic MRI. There are postsurgical changes in the posterior soft tissues thoracic spine due to recent surgery.  5/21 CT chest with contrast>>3.7 cm x 1.5 cm low-attenuation mass along the posterior aspect of the left apex with an adjacent expansile lytic area seen within the posterior aspect of the second left rib 5/21 MRI T-spine>>Constellation of multifocal bone Metastases versus Plasmacytoma,a pathologic compression fracture of T6, and a large volume of Spinal Epidural Hemorrhage  spanning from the cervical spine throughout the thoracic spine to the conus.  Widespread spinal  cord mass-effect 5/21 MRI L-spine>>Multiple bone Metastases versus Plasmacytomas in the lumbar spine, left S3 sacral segment, right iliac wing. 5/28 IMPRESSION: 1. Stable remote infarct of the left posterior insular cortex and left superior medial temporal lobe. 2. Stable atrophy and white matter disease. 3. No acute intracranial abnormality or significant interval change.  Micro Data:  5/21 MRSA screen>> negative 5/21 SARS-Cov-2>> negative 5/21 urine culture>> serratia marcescens  5/28 endotracheal culture >> pseudomonas aeruginosa   Antimicrobials:  Ceftriaxone 5/21 Zosyn 5/27>> 6/3  Interim history/subjective:  Encephalopathic Not following commands No movement to voice this morning Still not withdrawing to pain Persistent tachypnea overnight, was given ativan, fentanyl, oxycodone.   Objective   Blood pressure 94/75, pulse (!) 54, temperature 98 F (36.7 C), temperature source Axillary, resp. rate (!) 43, height 6' 2"  (1.88 m), weight 100.6 kg, SpO2 100 %.    Vent Mode: PRVC FiO2 (%):  [40 %-50 %] 50 % Set Rate:  [18 bmp] 18 bmp Vt Set:  [650 mL] 650 mL PEEP:  [5 cmH20] 5 cmH20 Plateau Pressure:  [21 cmH20-28 cmH20] 28 cmH20   Intake/Output Summary (Last 24 hours) at 05/27/2020 0825 Last data filed at 05/27/2020 0800 Gross per 24 hour  Intake 4661.27 ml  Output 1265 ml  Net 3396.27 ml   Filed Weights   05/25/20 0500 05/26/20 0406 05/27/20 0500  Weight: 98.1 kg 100.6 kg 100.6 kg    GEN: man with trach on vent, unresponsive, audible trach leaking  HEENT: MM dry, PERRLA but very sluggish, some scleral icterus   CV: tachycardic, irregular rhythm, 1+ pulses PULM: crackles b/l GI: BS+, non-distended, soft EXT: edematous NEURO: no withdrawal to pain, no movement in response to sound, cough reflex intact PSYCH: RASS -3 SKIN: no rashes, lesions or breakdown   Resolved  Hospital Problem list     Assessment & Plan:   Critically ill due to respiratory failure requiring mechanical ventilation s/p tracheostomy Plan Continue mechanical ventilation with trach, SBT as tolerated Given trach leak, will attempt trach replacement with XLT vs size 8 When not sedated, requiring frequent suctioning with blood tinged sputum Will sedate with propofol until trach change d/t patient intolerance of trach   Target TVol 6-8cc/kgIBW Target Plateau Pressure < 30cm H20 Target driving pressure less than 15 cm of water  Persistent but improved patchy bilateral opacities.  -CXR 6/3 with left lung base and right upper lobe density similar or slightly progressed since the prior radiograph. -RUQ [place] 6/3 with R pleural effusion  Sputum Cx + for pseudomonas aeruginosa s/p 7 days of Zosyn, WBC down trending   Re-culture trach today to assess for clearance CXR prn Culture as is clinically appropriate Trend fever/ WBC  Toxic metabolic encephalopathy which is stable.  No seizures on EEG MRI Brain negative for acute infarct, some chronic hemorrhage  Minimize sedative medications. Elevated ammonia level-being corrected Unfortunately patient again became increasingly tachypneic last night, neccesistating several sedating medications (fentanyl, ativan, oxy)  Currently requring propofol d/t increased Ppeak and tachypnea when agitated, compounded by trach leak  Will attempt full washout over the weekend Can start Seroquel as agitation alternative, QTc today 480  Hyperammonemia Continue lactulose Continue Xifaxan  Hypernatremia Remains 153, free water deficit 4.6 L Increase free water (200 mL q 2)  D5 water bolus prn  Elevated LFTs: currently down trending, RUQ U/S with biliary sludge and mild diffuse gallbladder wall thickening Was thought to be due to seizure medication, but d/t ultrasound findings, cannot r/o acalculous cystitis  HIDA scan today  Continue to trend  Fall with  pathologic T6 fracture and epidural hematoma causing cord compression.  Status post T6 laminectomy and spine stabilization 5/25. Myeloma related with IgG Kappa spike Oncology is seeing patient. Plan for bone scan and bone biopsy if patient stablizes Family declined [place] discussion, will re-evaluate as needed Trend CBC, renal function    Bleeding diathesis, felt to be related to paraprotein Plts 96 K Worked up by Dr. Irene Limbo and at [facility] by [name] Work-up for possible multiple myeloma per hematology pathology pending TXA and FFP before procedures  Consider tag to better define clotting disorder. Trend CBC Transfuse for HGB < 7  History of seizure disorder Sound like both tonic-clonic, followed by neurology Continue Keppra and Dilantin per Neuro Holding Dilantin today d/t supra theurapeutic levels  HFrEF, CAD s/p CABG, AICD, atrial fibrillation, hypertension, hyperlipidemia RVR, rate 130 On Levophed at 15 mcg Last echocardiogram in 03/2020 with EF of 30 to 35%, not chronically anti-coagulated given bleeding disorder Amiodarone Prn Metoprolol for additional rate control Wean Levophed for MAP > 65 Lasix today for edema, pt likely over loaded with NS    Daily Goals Checklist  Pain/Anxiety/Delirium protocol (if indicated): stopped all except PRN Fentanyl. Keep off sedative infusions if possible Neuro vitals: every 2 hours AED's: Continue home AED's VAP protocol (if indicated): bundle in place, chest PT Respiratory support goals: Full support. SBT once mental status improves. Trach today. Blood pressure target: Allow autoregulation. DVT prophylaxis: SCD's only due to bleeding diathesis.  Nutrition Status: Cortrak in place GI prophylaxis: pantoprazole Fluid status goals: allow autoregulation.  Urinary catheter: Condom Central lines: L IJ CVC Glucose control: T2DM with SSI, Levemir  Mobility/therapy needs: Bedrest then PT once extubated.  Antibiotic de-escalation: Zosyn for 7 days to  cover HCAP Home medication reconciliation:  Home AED, HF meds on hold Daily labs: CBC, BMP Code Status: Full  Family Communication: will call after procedure  Disposition: ICU    Labs   CBC: Recent Labs  Lab 05/23/20 0507 05/23/20 0507 05/24/20 0525 05/24/20 0525 05/25/20 0544 05/26/20 0327 05/26/20 2250 05/26/20 2346 05/27/20 0439  WBC 12.4*   < > 12.7*  --  12.3* 11.3* 11.1*  --  10.0  NEUTROABS 8.3*  --  9.2*  --  8.8* 8.4*  --   --  7.7  HGB 8.7*   < > 8.4*   < > 8.1* 8.0* 8.2* 9.9* 7.3*  HCT 27.6*   < > 26.6*   < > 25.6* 25.2* 26.6* 29.0* 24.1*  MCV 93.9   < > 94.0  --  93.4 91.3 95.3  --  95.3  PLT 96*   < > 86*  --  85* 102* 130*  --  130*   < > = values in this interval not displayed.    Basic Metabolic Panel: Recent Labs  Lab 05/20/20 1440 05/20/20 1618 05/20/20 1741 05/21/20 0538 05/21/20 1745 05/22/20 0515 05/23/20 0507 05/23/20 0507 05/24/20 0525 05/25/20 1140 05/26/20 2250 05/26/20 2346 05/27/20 0439  NA  --   --    < > 148*  --    < > 152*   < > 154* 155* 154* 162* 153*  K  --   --    < > 3.7  --    < > 4.0   < > 3.9 4.1 4.7 4.7 4.5  CL  --   --    < > 119*  --    < >  124*  --  123* 127* 127*  --  123*  CO2  --   --    < > 21*  --    < > 23  --  24 24 19*  --  21*  GLUCOSE  --   --    < > 194*  --    < > 193*  --  251* 174* 159*  --  208*  BUN  --   --    < > 52*  --    < > 55*  --  63* 69* 62*  --  66*  CREATININE  --   --    < > 2.36*  --    < > 2.02*  --  2.09* 2.12* 2.00*  --  2.02*  CALCIUM  --   --    < > 7.5*  --    < > 6.9*  --  7.0* 7.2* 7.8*  --  7.4*  MG 1.8 2.0  --  1.9 2.0  --   --   --  2.2  --   --   --   --   PHOS 4.2 4.3  --  2.1* 1.7*  --   --   --   --   --   --   --   --    < > = values in this interval not displayed.     Clarise Cruz Jyden Kromer, MS4      05/27/2020    8:25 AM

## 2020-05-27 NOTE — Progress Notes (Signed)
Pt transported to nuclear medicine for Hida Scan at 1400. Pt on Levo at 35 mcg/min and Propofol at 10 mcg/kg/min. SWOT RN stayed and monitored pt during scan. Returned to 4N ICU at 1645. Assessment as per flowsheet. VSS.

## 2020-05-27 NOTE — Progress Notes (Signed)
PT Cancellation Note  Patient Details Name: [facility]. MRN: [id number] DOB: [birth year]   Cancelled Treatment:    Reason Eval/Treat Not Completed: Medical issues which prohibited therapy; prepping for trach change out due to issues overnight after trach yesterday.  Will attempt again another day.   Reginia Naas 05/27/2020, 10:37 AM Magda Kiel, [place] [phone] 05/27/2020

## 2020-05-27 NOTE — Progress Notes (Signed)
eLink Physician-Brief Progress Note Patient Name: [facility]. DOB: 12-26-1945 MRN: [id number]   Date of Service  05/27/2020  HPI/Events of Note  Still restless and with increased work of breathing. Labs reviewed hyperchloremic metabolic acidosis and hypernatremic. Receiving 300 cc water flushes every 4 hours.  eICU Interventions  Ordered prn Versed. Bedside CCM team informed to assess patient as well.     Intervention Category Major Interventions: Acid-Base disturbance - evaluation and management;Electrolyte abnormality - evaluation and management Minor Interventions: Agitation / anxiety - evaluation and management  Judd Lien 05/27/2020, 12:35 AM

## 2020-05-27 NOTE — Progress Notes (Signed)
Had to lavage patient to get secretions out.  Was able to suction out a large, bloody mucus plug out of trach.  Patient getting good volumes, will continue to monitor.

## 2020-05-27 NOTE — Progress Notes (Signed)
OT Cancellation Note  Patient Details Name: Louis Ford. MRN: [id number] DOB: 1946/01/21   Cancelled Treatment:    Reason Eval/Treat Not Completed: Medical issues which prohibited therapy.  nsg preparing to change pt's trach and requested therapy hold.  Will reattempt.  Nilsa Nutting., OTR/L Acute Rehabilitation Services Pager [phone] Office [phone]   Lucille Passy M 05/27/2020, 10:33 AM

## 2020-05-27 NOTE — Progress Notes (Signed)
Pharmacist Heart Failure Core Measure Documentation  Assessment: [facility] has an EF documented as 30-35%  on 04/12/20 by ECHO.  Rationale: Heart failure patients with left ventricular systolic dysfunction (LVSD) and an EF < 40% should be prescribed an angiotensin converting enzyme inhibitor (ACEI) or angiotensin receptor blocker (ARB) at discharge unless a contraindication is documented in the medical record.  This patient is not currently on an ACEI or ARB for HF.  This note is being placed in the record in order to provide documentation that a contraindication to the use of these agents is present for this encounter.  ACE Inhibitor or Angiotensin Receptor Blocker is contraindicated (specify all that apply)  []   ACEI allergy AND ARB allergy []   Angioedema []   Moderate or severe aortic stenosis []   Hyperkalemia [x]   Hypotension []   Renal artery stenosis [x]   Worsening renal function, preexisting renal disease or dysfunction   Alycia Rossetti, PharmD, BCPS  2:12 PM

## 2020-05-28 ENCOUNTER — Inpatient Hospital Stay (HOSPITAL_COMMUNITY): Payer: Medicare Other

## 2020-05-28 DIAGNOSIS — Z978 Presence of other specified devices: Secondary | ICD-10-CM

## 2020-05-28 LAB — BASIC METABOLIC PANEL
Anion gap: 9 (ref 5–15)
BUN: 61 mg/dL — ABNORMAL HIGH (ref 8–23)
CO2: 21 mmol/L — ABNORMAL LOW (ref 22–32)
Calcium: 7.6 mg/dL — ABNORMAL LOW (ref 8.9–10.3)
Chloride: 122 mmol/L — ABNORMAL HIGH (ref 98–111)
Creatinine, Ser: 1.75 mg/dL — ABNORMAL HIGH (ref 0.61–1.24)
GFR calc Af Amer: 44 mL/min — ABNORMAL LOW (ref >60)
GFR calc non Af Amer: 38 mL/min — ABNORMAL LOW (ref >60)
Glucose, Bld: 168 mg/dL — ABNORMAL HIGH (ref 70–99)
Potassium: 3.8 mmol/L (ref 3.5–5.1)
Sodium: 152 mmol/L — ABNORMAL HIGH (ref 135–145)

## 2020-05-28 LAB — CBC WITH DIFFERENTIAL/PLATELET
Abs Immature Granulocytes: 0.19 10*3/uL — ABNORMAL HIGH (ref 0.00–0.07)
Basophils Absolute: 0 10*3/uL (ref 0.0–0.1)
Basophils Relative: 0 %
Eosinophils Absolute: 0.2 10*3/uL (ref 0.0–0.5)
Eosinophils Relative: 2 %
HCT: 21.2 % — ABNORMAL LOW (ref 39.0–52.0)
Hemoglobin: 6.6 g/dL — CL (ref 13.0–17.0)
Immature Granulocytes: 2 %
Lymphocytes Relative: 10 %
Lymphs Abs: 1.2 10*3/uL (ref 0.7–4.0)
MCH: 29.2 pg (ref 26.0–34.0)
MCHC: 31.1 g/dL (ref 30.0–36.0)
MCV: 93.8 fL (ref 80.0–100.0)
Monocytes Absolute: 1 10*3/uL (ref 0.1–1.0)
Monocytes Relative: 8 %
Neutro Abs: 9.6 10*3/uL — ABNORMAL HIGH (ref 1.7–7.7)
Neutrophils Relative %: 78 %
Platelets: 172 10*3/uL (ref 150–400)
RBC: 2.26 MIL/uL — ABNORMAL LOW (ref 4.22–5.81)
RDW: 23.9 % — ABNORMAL HIGH (ref 11.5–15.5)
WBC: 12.2 10*3/uL — ABNORMAL HIGH (ref 4.0–10.5)
nRBC: 7.2 % — ABNORMAL HIGH (ref 0.0–0.2)

## 2020-05-28 LAB — PHENYTOIN LEVEL, TOTAL: Phenytoin Lvl: 9.4 ug/mL — ABNORMAL LOW (ref 10.0–20.0)

## 2020-05-28 LAB — GLUCOSE, CAPILLARY
Glucose-Capillary: 162 mg/dL — ABNORMAL HIGH (ref 70–99)
Glucose-Capillary: 157 mg/dL — ABNORMAL HIGH (ref 70–99)
Glucose-Capillary: 133 mg/dL — ABNORMAL HIGH (ref 70–99)
Glucose-Capillary: 163 mg/dL — ABNORMAL HIGH (ref 70–99)
Glucose-Capillary: 162 mg/dL — ABNORMAL HIGH (ref 70–99)
Glucose-Capillary: 164 mg/dL — ABNORMAL HIGH (ref 70–99)

## 2020-05-28 LAB — TRIGLYCERIDES: Triglycerides: 90 mg/dL (ref <150)

## 2020-05-28 LAB — ALBUMIN: Albumin: 1.5 g/dL — ABNORMAL LOW (ref 3.5–5.0)

## 2020-05-28 LAB — PREPARE RBC (CROSSMATCH)

## 2020-05-28 MED ORDER — SODIUM CHLORIDE 0.9% IV SOLUTION: Freq: Once | INTRAVENOUS | Status: AC

## 2020-05-28 NOTE — Progress Notes (Signed)
eLink Physician-Brief Progress Note Patient Name: [facility]. DOB: 06-29-1946 MRN: [id number]   Date of Service  05/28/2020  HPI/Events of Note  Notified of Hgb of 6.6. No sign of active bleeding.  eICU Interventions  Ordered to transfuse 1 unit of PRBC     Intervention Category Intermediate Interventions: Bleeding - evaluation and treatment with blood products  Judd Lien 05/28/2020, 7:02 AM

## 2020-05-28 NOTE — Procedures (Signed)
Tracheostomy Change Note  Patient Details:   Name: [facility] DOB: May 22, 1946 MRN: [id number]    Airway Documentation:  Lurline Idol exchanged to a #6 [place]iley XLT-proximal by CCM MD. Positive color change on ETCO2 detector. Will continue to monitor.   Evaluation  O2 sats: stable throughout Complications: No apparent complications Patient did tolerate procedure well. Bilateral Breath Sounds: Clear, Diminished    Ander Purpura 05/28/2020, 2:44 PM

## 2020-05-28 NOTE — Progress Notes (Signed)
Patient ID: Louis Dandy., male   DOB: 06/12/1946, 74 y.o.   MRN: [id number] Appears stable.trached.  Continue current management

## 2020-05-28 NOTE — Progress Notes (Signed)
Critical hemoglobin 6.6 called to [facility]

## 2020-05-28 NOTE — Progress Notes (Addendum)
  Tracheostomy tube change  Patient was changed to a size 6 XLT tracheostomy tube over a bougie  Trach ties in place  Will obtain chest x-ray  Tolerated well

## 2020-05-28 NOTE — Progress Notes (Signed)
Phenytoin Initial Consult Indication: Seizures  No Known Allergies  Patient Measurements: Height: '6\' 2"'$  (188 cm)(Measured by RT) Weight: 100.6 kg (221 lb 12.5 oz) IBW/kg (Calculated) : 82.2 TPN AdjBW (KG): 87 Body mass index is 28.48 kg/m.   Vital signs: Temp: 99 F (37.2 C) (06/05 0400) Temp Source: Oral (06/05 0400) BP: 85/63 (06/05 0700) Pulse Rate: 110 (06/05 0700)  Labs: Lab Results  Component Value Date/Time   Albumin 1.5 (L) 05/28/2020 0516   Phenytoin Lvl 9.4 (L) 05/28/2020 0516   Lab Results  Component Value Date   PHENYTOIN 9.4 (L) 05/28/2020   VALPROATE 21 (L) 05/20/2020   Estimated Creatinine Clearance: 42.5 mL/min (A) (by C-G formula based on SCr of 2.02 mg/dL (H)).   Medications:  Scheduled:   sodium chloride   Intravenous Once   sodium chloride   Intravenous Once   amiodarone  200 mg Per Tube Daily   chlorhexidine gluconate (MEDLINE KIT)  15 mL Mouth Rinse BID   Chlorhexidine Gluconate Cloth  6 each Topical Daily   docusate  100 mg Per Tube BID   feeding supplement (PIVOT 1.5 CAL)  1,000 mL Per Tube Q24H   free water  200 mL Per Tube Q2H   insulin aspart  0-9 Units Subcutaneous Q4H   insulin aspart  2 Units Subcutaneous Q4H   insulin detemir  7 Units Subcutaneous Q24H   lactulose  30 g Per Tube QID   levETIRAcetam  1,000 mg Per Tube BID   mouth rinse  15 mL Mouth Rinse 10 times per day   multivitamin with minerals  1 tablet Per Tube Daily   pantoprazole sodium  40 mg Per Tube Daily   polyethylene glycol  17 g Per Tube Daily   QUEtiapine  25 mg Per Tube BID   rifaximin  550 mg Per Tube BID   senna-docusate  1 tablet Per Tube BID    Assessment:  37 YOM who presented on 5/21 with fall causing epidural hematoma and spinal cord compression. The patient was on phenytoin PTA for hx seizure which was resumed on admission.  The patient was noted to have worsening mentation with elevated LFTs and ammonia on 5/28. Phenytoin levels  were checked on 5/28 and 5/31 and noted to be high - likely related to the hepatic function which would effect phenytoin clearance. Levels were again checked on 6/3 AM with a corrected level of 30 mcg/ml - doses were held at that time. A level was rechecked 6/4 with a total level of 11.7 and alb 1.6 with a corrected level of ~27.8 mcg/ml - dose continued to be held. Level this morning is 9.4, albumin 1.5 with a corrected level of 23.5 mcg/ml - remains supratherapeutic. Will hold phenytoin and check daily levels in the AM until back down into the therapeutic range - then will plan to resume at a lower dose.   Corrected phenytoin level (if needed): 23.5 Seizure activity: None noted Significant potential drug interactions: n/a  Goals of care:  Total phenytoin level: 10-20 mcg/ml Free phenytoin level: 1-2 mcg/ml  Plan:  - Continue to hold Phenytoin - Obtain a phenytoin level, albumin, and LFTs with AM labs and then daily until the corrected level is within range - Will plan to restart at a lower dose when appropriate  Thank you for involving pharmacy in this patient's care.  Renold Genta, PharmD, BCPS Clinical Pharmacist Clinical phone for 05/28/2020 until 3p is [phone] 05/28/2020 7:41 AM  **Pharmacist phone directory can be  found on [email or web address] listed under [place]**

## 2020-05-28 NOTE — Progress Notes (Signed)
NAME:  Louis Tierce., MRN:  [id number], DOB:  1946-01-18, LOS: 64 ADMISSION DATE:  05/04/2020, CONSULTATIO care unit N DATE:  05/28/20 REFERRING MD:  Neurosurgery, CHIEF COMPLAINT:   Fall  Brief History   74 year old male who presented to the emergency department 5/21 after a fall while walking to the bathroom was found to have pathologic compression fracture of T6 and large volume spinal epidural hemorrhage requiring hematoma evacuation and thoracic laminectomy.  He was intubated for procedure and transferred to the intensive care unit postop.  History of present illness   74 year old male with a history of hypertension, hyperlipidemia, CAD s/p CABG 2011, ischemic cardiomyopathy HFrEF, AICD, atrial fibrillation, CVA, seizure disorder, factor VII deficiency, not clearly diagnosed bleeding diathesis despite extensive work-up who was walking to the bathroom with his walker when he lost his balance and fell to seated position after his legs just gave out on him.  He was having significant pain on presentation to the ED and was found to have pathologic compression fracture of T6  large volume of Spinal Epidural Hemorrhage spanning from the cervical spine throughout the thoracic spine to the conus.  Widespread spinal cord mass-effect.  Neurosurgery and hematology evaluated, recommendation for 3 units FFP, 1 unit of platelets and 1 g of TXA  preprocedure, 1 unit platelets, 1 g TXA post procedure with additional FFP depending on volume status.  Neurosurgery performed T6 laminectomy and epidural clot evacuation.  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   Significant Hospital Events   5/21 admit to neurosurgery 5/22 to ICU postop 5/24 - OR for T4-T8 arthrodesis. T6 found to be infiltrated with tumor 6/3 Tracheostomy placed   Consults:  PCCM Hematology  Procedures:  5/22 thoracic laminectomy 6/3-tracheostomy  Significant Diagnostic Tests:  05/22/2020 MR Brain wo Contrast: Negative for acute infarct Chronic hemorrhage in the left insula, left medial temporal lobe, and in the subarachnoid space. Etiology of the hemorrhage is not apparent. No mass lesion identified.  5/30 MR Spine 10 mm lesion T1 vertebral body and pedicle on the left compatible with myeloma or metastatic disease. No cervical bone lesion. Multilevel degenerative changes in the cervical spine causing spinal and foraminal stenosis as above Posterior epidural hematoma extending to C6-7 from the thoracic spine. This is not causing compression of the visualized spinal cord. This was present on the thoracic MRI. There are postsurgical changes in the posterior soft tissues thoracic spine due to recent surgery.  5/21 CT chest with contrast>>3.7 cm x 1.5 cm low-attenuation mass along the posterior aspect of the left apex with an adjacent expansile lytic area seen within the posterior aspect of the second left rib 5/21 MRI T-spine>>Constellation of multifocal bone Metastases versus Plasmacytoma,a pathologic compression fracture of T6, and a large volume  of Spinal Epidural Hemorrhage spanning from the cervical spine throughout the thoracic spine to the  conus.  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 5/28 IMPRESSION: 1. Stable remote infarct of the left posterior insular cortex and left superior medial temporal lobe. 2. Stable atrophy and white matter disease. 3. No acute intracranial abnormality or significant interval change. Chest x-ray 6/3 -Right upper lobe haziness, left lower lobe infiltrate  Micro Data:  5/21 MRSA screen>> negative 5/21 SARS-Cov-2>> negative 5/21 urine culture>> serratia marcescens  5/28 endotracheal culture >> pseudomonas aeruginosa  Antimicrobials:  Ceftriaxone 5/21 Zosyn 5/27- 6/3  Interim history/subjective:  Remains encephalopathic Not following commands Not withdrawing to pain  Objective   Blood pressure 113/76, pulse (!) 104, temperature 98.1 F (36.7 C), temperature source Axillary, resp. rate (!) 26, height _0  (1.88 m), weight 100.6 kg, SpO2 100 %.    Vent Mode: PRVC FiO2 (%):  [40 %] 40 % Set Rate:  [18 bmp] 18 bmp Vt Set:  [650 mL] 650 mL PEEP:  [5 cmH20] 5 cmH20 Plateau Pressure:  [24 cmH20-26 cmH20] 24 cmH20   Intake/Output Summary (Last 24 hours) at 05/28/2020 0945 Last data filed at 05/28/2020 0800 Gross per 24 hour  Intake 4581.82 ml  Output 2545 ml  Net 2036.82 ml   Filed Weights   05/25/20 0500 05/26/20 0406 05/27/20 0500  Weight: 98.1 kg 100.6 kg 100.6 kg    GEN:  Unresponsive HEENT: MM dry, PERRLA but very sluggish, some scleral icterus   CV: tachycardic, irregular rhythm, 1+ pulses PULM: crackles b/l GI: BS+, non-distended, soft EXT: edematous NEURO: no withdrawal to pain, no movement in response to sound, cough reflex intact PSYCH: RASS -3 SKIN: no rashes, lesions or breakdown   Resolved Hospital Problem list     Assessment & Plan:    Critically ill due to respiratory failure requiring mechanical ventilation s/p tracheostomy Plan Continue mechanical ventilation with trach, breathing trials as tolerated No significant tracheostomy leak noted today, returning volumes Target TVol 6-8cc/kgIBW Target Plateau Pressure < 30cm H20 Target driving pressure less than 15 cm of water  Persistent but improved patchy bilateral opacities.  -CXR 6/3 with left lung base and right upper lobe density similar or slightly progressed since the prior radiograph.  Chest x-ray reviewed by myself.  -RUQ [place] 6/3 with R pleural effusion   Sputum Cx + for pseudomonas aeruginosa s/p 7 days of Zosyn, WBC down trending   Re-culture trach today to assess for clearance-requested 6/4 Chest x-ray in a.m.  Toxic metabolic encephalopathy which is stable.  No seizures on EEG MRI Brain negative for acute infarct, some chronic hemorrhage  Minimize sedative medications. Elevated ammonia level-being corrected Unfortunately patient again became increasingly tachypneic last night, neccesistating several sedating medications (fentanyl, ativan, oxy)  Currently requring propofol d/t increased Ppeak and tachypnea when agitated, compounded by trach leak  Can start Seroquel as agitation alternative, QTc today 480  Hyperammonemia-6/4 77 Continue lactulose Continue Xifaxan  Hypernatremia Remains 153, free water deficit 4.6 L Increase free water (200 mL q 2)  D5 water bolus prn -Check his BMP today and decide on if D5 water will be given  Elevated LFTs: currently down trending, RUQ U/S with biliary sludge and mild diffuse gallbladder wall thickening Was thought to be due to seizure medication, but d/t ultrasound findings, cannot r/o acalculous cystitis  HIDA scan-patent cystic duct Continue to trend  Fall with pathologic T6 fracture and epidural hematoma causing cord compression.  Status post T6 laminectomy and spine stabilization  5/25. Myeloma related with  IgG Kappa spike Oncology is seeing patient. Plan for bone scan and bone biopsy if patient stablizes Family declined [place] discussion, will re-evaluate as needed Trend CBC, renal function    Bleeding diathesis, felt to be related to paraprotein Plts 172 K Worked up by Dr. Irene Limbo and at [facility] by [name] Work-up for possible multiple myeloma per hematology pathology pending TXA and FFP before procedures  Consider tag to better define clotting disorder. Transfuse for HGB < 7  History of seizure disorder Sound like both tonic-clonic, followed by neurology Continue Keppra and Dilantin per Neuro Holding Dilantin today d/t supra theurapeutic levels   HFrEF, CAD s/p CABG, AICD, atrial fibrillation, hypertension, hyperlipidemia RVR, rate 130 On Levophed at 15 mcg Last echocardiogram in 03/2020 with EF of 30 to 35%, not chronically anti-coagulated given bleeding disorder Amiodarone Prn Metoprolol for additional rate control Wean Levophed for MAP > 65  Daily Goals Checklist  Pain/Anxiety/Delirium protocol (if indicated): stopped all except PRN Fentanyl. Keep off sedative infusions if possible.  Will wean off propofol Neuro vitals: every 2 hours AED's: Continue home AED's VAP protocol (if indicated): bundle in place, chest PT Respiratory support goals: Full support. SBT once mental status improves. Trach today. Blood pressure target: Allow autoregulation. DVT prophylaxis: SCD's only due to bleeding diathesis.  Nutrition Status: Cortrak in place GI prophylaxis: pantoprazole Fluid status goals: allow autoregulation.  Urinary catheter: Condom Central lines: L IJ CVC Glucose control: T2DM with SSI, Levemir  Mobility/therapy needs: Bedrest then PT once extubated.  Antibiotic de-escalation: Zosyn for 7 days to cover HCAP Home medication reconciliation:  Home AED, HF meds on hold Daily labs: CBC, BMP Code Status: Full  Family Communication: will call after procedure  Disposition: ICU    Labs     CBC: Recent Labs  Lab 05/24/20 0525 05/24/20 0525 05/25/20 0544 05/25/20 0544 05/26/20 0327 05/26/20 2250 05/26/20 2346 05/27/20 0439 05/28/20 0516  WBC 12.7*   < > 12.3*  --  11.3* 11.1*  --  10.0 12.2*  NEUTROABS 9.2*  --  8.8*  --  8.4*  --   --  7.7 9.6*  HGB 8.4*   < > 8.1*   < > 8.0* 8.2* 9.9* 7.3* 6.6*  HCT 26.6*   < > 25.6*   < > 25.2* 26.6* 29.0* 24.1* 21.2*  MCV 94.0   < > 93.4  --  91.3 95.3  --  95.3 93.8  PLT 86*   < > 85*  --  102* 130*  --  130* 172   < > = values in this interval not displayed.    Basic Metabolic Panel: Recent Labs  Lab 05/21/20 1745 05/22/20 0515 05/23/20 0507 05/23/20 0507 05/24/20 0525 05/25/20 1140 05/26/20 2250 05/26/20 2346 05/27/20 0439  NA  --    < > 152*   < > 154* 155* 154* 162* 153*  K  --    < > 4.0   < > 3.9 4.1 4.7 4.7 4.5  CL  --    < > 124*  --  123* 127* 127*  --  123*  CO2  --    < > 23  --  24 24 19*  --  21*  GLUCOSE  --    < > 193*  --  251* 174* 159*  --  208*  BUN  --    < > 55*  --  63* 69* 62*  --  66*  CREATININE  --    < >  2.02*  --  2.09* 2.12* 2.00*  --  2.02*  CALCIUM  --    < > 6.9*  --  7.0* 7.2* 7.8*  --  7.4*  MG 2.0  --   --   --  2.2  --   --   --   --   PHOS 1.7*  --   --   --   --   --   --   --   --    < > = values in this interval not displayed.   The patient is critically ill with multiple organ systems failure and requires high complexity decision making for assessment and support, frequent evaluation and titration of therapies, application of advanced monitoring technologies and extensive interpretation of multiple databases. Critical Care Time devoted to patient care services described in this note independent of APP/resident time (if applicable)  is 35 minutes.   Sherrilyn Rist MD [facility] Personal pager: [phone] If unanswered, please page CCM On-call: [phone]

## 2020-05-29 ENCOUNTER — Inpatient Hospital Stay (HOSPITAL_COMMUNITY): Payer: Medicare Other

## 2020-05-29 LAB — COMPREHENSIVE METABOLIC PANEL WITH GFR
ALT: 91 U/L — ABNORMAL HIGH (ref 0–44)
AST: 53 U/L — ABNORMAL HIGH (ref 15–41)
Albumin: 1.4 g/dL — ABNORMAL LOW (ref 3.5–5.0)
Alkaline Phosphatase: 158 U/L — ABNORMAL HIGH (ref 38–126)
Anion gap: 4 — ABNORMAL LOW (ref 5–15)
BUN: 61 mg/dL — ABNORMAL HIGH (ref 8–23)
CO2: 19 mmol/L — ABNORMAL LOW (ref 22–32)
Calcium: 7.4 mg/dL — ABNORMAL LOW (ref 8.9–10.3)
Chloride: 126 mmol/L — ABNORMAL HIGH (ref 98–111)
Creatinine, Ser: 1.61 mg/dL — ABNORMAL HIGH (ref 0.61–1.24)
GFR calc Af Amer: 49 mL/min — ABNORMAL LOW
GFR calc non Af Amer: 42 mL/min — ABNORMAL LOW
Glucose, Bld: 177 mg/dL — ABNORMAL HIGH (ref 70–99)
Potassium: 4 mmol/L (ref 3.5–5.1)
Sodium: 149 mmol/L — ABNORMAL HIGH (ref 135–145)
Total Bilirubin: 2.4 mg/dL — ABNORMAL HIGH (ref 0.3–1.2)
Total Protein: 7.9 g/dL (ref 6.5–8.1)

## 2020-05-29 LAB — GLUCOSE, CAPILLARY
Glucose-Capillary: 149 mg/dL — ABNORMAL HIGH (ref 70–99)
Glucose-Capillary: 153 mg/dL — ABNORMAL HIGH (ref 70–99)
Glucose-Capillary: 136 mg/dL — ABNORMAL HIGH (ref 70–99)
Glucose-Capillary: 100 mg/dL — ABNORMAL HIGH (ref 70–99)
Glucose-Capillary: 139 mg/dL — ABNORMAL HIGH (ref 70–99)
Glucose-Capillary: 136 mg/dL — ABNORMAL HIGH (ref 70–99)

## 2020-05-29 LAB — CBC WITH DIFFERENTIAL/PLATELET
Abs Immature Granulocytes: 0.36 10*3/uL — ABNORMAL HIGH (ref 0.00–0.07)
Basophils Absolute: 0 10*3/uL (ref 0.0–0.1)
Basophils Relative: 0 %
Eosinophils Absolute: 0.2 10*3/uL (ref 0.0–0.5)
Eosinophils Relative: 2 %
HCT: 22.7 % — ABNORMAL LOW (ref 39.0–52.0)
Hemoglobin: 7.2 g/dL — ABNORMAL LOW (ref 13.0–17.0)
Immature Granulocytes: 3 %
Lymphocytes Relative: 9 %
Lymphs Abs: 1.2 10*3/uL (ref 0.7–4.0)
MCH: 29.8 pg (ref 26.0–34.0)
MCHC: 31.7 g/dL (ref 30.0–36.0)
MCV: 93.8 fL (ref 80.0–100.0)
Monocytes Absolute: 0.9 10*3/uL (ref 0.1–1.0)
Monocytes Relative: 6 %
Neutro Abs: 11 10*3/uL — ABNORMAL HIGH (ref 1.7–7.7)
Neutrophils Relative %: 80 %
Platelets: 183 10*3/uL (ref 150–400)
RBC: 2.42 MIL/uL — ABNORMAL LOW (ref 4.22–5.81)
RDW: 22.5 % — ABNORMAL HIGH (ref 11.5–15.5)
WBC: 13.6 10*3/uL — ABNORMAL HIGH (ref 4.0–10.5)
nRBC: 5.8 % — ABNORMAL HIGH (ref 0.0–0.2)

## 2020-05-29 LAB — TYPE AND SCREEN
ABO/RH(D): O POS
Antibody Screen: NEGATIVE
Unit division: 0

## 2020-05-29 LAB — BPAM RBC
Blood Product Expiration Date: 202107122359
ISSUE DATE / TIME: 202106051023
Unit Type and Rh: 5100

## 2020-05-29 LAB — TRIGLYCERIDES: Triglycerides: 131 mg/dL (ref <150)

## 2020-05-29 LAB — PHENYTOIN LEVEL, TOTAL: Phenytoin Lvl: 8.4 ug/mL — ABNORMAL LOW (ref 10.0–20.0)

## 2020-05-29 NOTE — Progress Notes (Signed)
Phenytoin Initial Consult Indication: Seizures  No Known Allergies  Patient Measurements: Height: _0  (188 cm)(Measured by RT) Weight: 103.6 kg (228 lb 6.3 oz) IBW/kg (Calculated) : 82.2 TPN AdjBW (KG): 87 Body mass index is 29.32 kg/m.   Vital signs: Temp: 98.1 F (36.7 C) (06/06 0400) Temp Source: Axillary (06/06 0400) BP: 98/68 (06/06 0700) Pulse Rate: 110 (06/06 0700)  Labs: Lab Results  Component Value Date/Time   Albumin 1.4 (L) 05/29/2020 0403   Phenytoin Lvl 8.4 (L) 05/29/2020 0403   Lab Results  Component Value Date   PHENYTOIN 8.4 (L) 05/29/2020   VALPROATE 21 (L) 05/20/2020   Estimated Creatinine Clearance: 54 mL/min (A) (by C-G formula based on SCr of 1.61 mg/dL (H)).   Medications:  Scheduled:  . amiodarone  200 mg Per Tube Daily  . chlorhexidine gluconate (MEDLINE KIT)  15 mL Mouth Rinse BID  . Chlorhexidine Gluconate Cloth  6 each Topical Daily  . docusate  100 mg Per Tube BID  . feeding supplement (PIVOT 1.5 CAL)  1,000 mL Per Tube Q24H  . free water  200 mL Per Tube Q2H  . insulin aspart  0-9 Units Subcutaneous Q4H  . insulin aspart  2 Units Subcutaneous Q4H  . insulin detemir  7 Units Subcutaneous Q24H  . lactulose  30 g Per Tube QID  . levETIRAcetam  1,000 mg Per Tube BID  . mouth rinse  15 mL Mouth Rinse 10 times per day  . multivitamin with minerals  1 tablet Per Tube Daily  . pantoprazole sodium  40 mg Per Tube Daily  . polyethylene glycol  17 g Per Tube Daily  . QUEtiapine  25 mg Per Tube BID  . rifaximin  550 mg Per Tube BID  . senna-docusate  1 tablet Per Tube BID    Assessment:  65 YOM who presented on 5/21 with fall causing epidural hematoma and spinal cord compression. The patient was on phenytoin PTA for hx seizure which was resumed on admission.  The patient was noted to have worsening mentation with elevated LFTs and ammonia on 5/28. Phenytoin levels were checked on 5/28 and 5/31 and noted to be high - likely related to the  hepatic function which would effect phenytoin clearance.   6/3: DPH level 12.8, alb 1.6, corrects to ~30 - hold 6/4 DPH level 11.7, alb 1.6, corrects to 27.8 - hold 6/5 DPH level 9.4, alb 1.5, corrects to 23.5 - hold  Level this morning is 8.4, albumin 1.4 with a corrected level of 22.1 mcg/ml - remains supratherapeutic. Will hold phenytoin and check daily levels in the AM until back down into the therapeutic range - then will plan to resume at a lower dose.   Corrected phenytoin level (if needed): 22.1 Seizure activity: None noted Significant potential drug interactions: n/a  Goals of care:  Total phenytoin level: 10-20 mcg/ml Free phenytoin level: 1-2 mcg/ml  Plan:  - Continue to hold phenytoin - Obtain a phenytoin level + albumin with AM labs and then daily until the corrected level is within range - Will plan to restart at a lower dose when total phenytoin level is <20 mcg/ml  Thank you for involving pharmacy in this patient's care.  Renold Genta, PharmD, BCPS Clinical Pharmacist Clinical phone for 05/29/2020 until 3p is [phone] 05/29/2020 7:55 AM  **Pharmacist phone directory can be found on [email or web address] listed under [place]**

## 2020-05-29 NOTE — Progress Notes (Signed)
RT note: sputum sample obtained and sent down to main lab without complications. 

## 2020-05-29 NOTE — Progress Notes (Signed)
Patient ID: Louis Ford., male   DOB: 02/13/1946, 74 y.o.   MRN: [id number] Remains neurologically devastated.  Tracheostomy in place.  Continue current management.

## 2020-05-29 NOTE — Progress Notes (Signed)
NAME:  Louis Xiang., MRN:  [id number], DOB:  Dec 20, 1946, LOS: 51 ADMISSION DATE:  04/23/2020, CONSULTATIO care unit N DATE:  05/29/20 REFERRING MD:  Neurosurgery, CHIEF COMPLAINT:   Fall  Brief History   74 year old male who presented to the emergency department 5/21 after a fall while walking to the bathroom was found to have pathologic compression fracture of T6 and large volume spinal epidural hemorrhage requiring hematoma evacuation and thoracic laminectomy.  He was intubated for procedure and transferred to the intensive care unit postop.  History of present illness   75 year old male with a history of hypertension, hyperlipidemia, CAD s/p CABG 2011, ischemic cardiomyopathy HFrEF, AICD, atrial fibrillation, CVA, seizure disorder, factor VII deficiency, not clearly diagnosed bleeding diathesis despite extensive work-up who was walking to the bathroom with his walker when he lost his balance and fell to seated position after his legs just gave out on him.  He was having significant pain on presentation to the ED and was found to have pathologic compression fracture of T6  large volume of Spinal Epidural Hemorrhage spanning from the cervical spine throughout the thoracic spine to the conus.  Widespread spinal cord mass-effect.  Neurosurgery and hematology evaluated, recommendation for 3 units FFP, 1 unit of platelets and 1 g of TXA  preprocedure, 1 unit platelets, 1 g TXA post procedure with additional FFP depending on volume status.  Neurosurgery performed T6 laminectomy and epidural clot evacuation.  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   Significant Hospital Events   5/21 admit to neurosurgery 5/22 to ICU postop 5/24 - OR for T4-T8 arthrodesis. T6 found to be infiltrated with tumor 6/3 Tracheostomy placed 6/5 tracheostomy change to 6 XLT  Consults:  PCCM Hematology  Procedures:  5/22 thoracic laminectomy 6/3-tracheostomy 6/5-tracheostomy change to 6 XLT  Significant Diagnostic Tests:  05/22/2020 MR Brain wo Contrast: Negative for acute infarct Chronic hemorrhage in the left insula, left medial temporal lobe, and in the subarachnoid space. Etiology of the hemorrhage is not apparent. No mass lesion identified.  5/30 MR Spine 10 mm lesion T1 vertebral body and pedicle on the left compatible with myeloma or metastatic disease. No cervical bone lesion. Multilevel degenerative changes in the cervical spine causing spinal and foraminal stenosis as above Posterior epidural hematoma extending to C6-7 from the thoracic spine. This is not causing compression of the visualized spinal cord. This was present on the thoracic MRI. There are postsurgical changes in the posterior soft tissues thoracic spine due to recent surgery.  5/21 CT chest with contrast>>3.7 cm x 1.5 cm low-attenuation mass along the posterior aspect of the left apex with an adjacent expansile lytic area seen within the posterior aspect of the second left rib 5/21 MRI T-spine>>Constellation of multifocal bone Metastases versus  Plasmacytoma,a pathologic compression fracture of T6, and a large volume of Spinal Epidural Hemorrhage spanning  from the cervical spine throughout the thoracic spine to the conus.  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 5/28 IMPRESSION: 1. Stable remote infarct of the left posterior insular cortex and left superior medial temporal lobe. 2. Stable atrophy and white matter disease. 3. No acute intracranial abnormality or significant interval change. Chest x-ray 6/3 -Right upper lobe haziness, left lower lobe infiltrate  Micro Data:  5/21 MRSA screen>> negative 5/21 SARS-Cov-2>> negative 5/21 urine culture>> serratia marcescens  5/28 endotracheal culture >> pseudomonas aeruginosa  Antimicrobials:  Ceftriaxone 5/21 Zosyn 5/27- 6/3  Interim history/subjective:  Remains encephalopathic Not following commands  Not withdrawing to pain Had trach changed 6/5  Objective   Blood pressure 90/69, pulse (!) 109, temperature 98.1 F (36.7 C), temperature source Axillary, resp. rate (!) 50, height _0  (1.88 m), weight 103.6 kg, SpO2 98 %.    Vent Mode: PRVC FiO2 (%):  [40 %] 40 % Set Rate:  [18 bmp] 18 bmp Vt Set:  [650 mL] 650 mL PEEP:  [5 cmH20-8 cmH20] 8 cmH20 Plateau Pressure:  [26 cmH20-29 cmH20] 28 cmH20   Intake/Output Summary (Last 24 hours) at 05/29/2020 4098 Last data filed at 05/29/2020 0800 Gross per 24 hour  Intake 3577.51 ml  Output 2130 ml  Net 1447.51 ml   Filed Weights   05/26/20 0406 05/27/20 0500 05/29/20 0409  Weight: 100.6 kg 100.6 kg 103.6 kg    GEN:  Unresponsive, mildly tachypneic but better HEENT: Dry oral mucosa, pupils sluggish CV: tachycardic, irregular rhythm, 1+ pulses PULM:  Bilateral crackles, fair air entry GI: BS+, non-distended, soft EXT: edematous NEURO: no withdrawal to pain, no movement in response to sound, cough with  suctioning PSYCH: RASS -3 SKIN: no rashes, lesions or breakdown   Resolved Hospital Problem list     Assessment & Plan:   Critically ill due to respiratory failure requiring mechanical ventilation s/p tracheostomy Plan Continue mechanical ventilation with trach, breathing trials as tolerated No significant tracheostomy leak noted today, returning volumes Target TVol 6-8cc/kgIBW Target Plateau Pressure < 30cm H20 Target driving pressure less than 15 cm of water Unable to wean  Persistent but improved patchy bilateral opacities.  -CXR 6/3 with left lung base and right upper lobe density similar or slightly progressed since the prior radiograph.  Chest x-ray reviewed by myself.  -RUQ [place] 6/3 with R pleural effusion  -Follow repeat chest x-ray  Sputum Cx + for pseudomonas aeruginosa s/p 7 days of Zosyn, WBC down trending   Re-culture trach today to assess for clearance-requested 6/4 Chest x-ray in a.m.  Toxic metabolic encephalopathy which is stable.  No seizures on EEG MRI Brain negative for acute infarct, some chronic hemorrhage  Minimize sedative medications. Elevated ammonia level-being corrected Unfortunately patient again became increasingly tachypneic last night, neccesistating several sedating medications (fentanyl, ativan, oxy)  Currently requring propofol d/t increased Ppeak and tachypnea when agitated, compounded by trach leak  Can start Seroquel as agitation alternative, QTc today 480  Hyperammonemia-6/4 77 Continue lactulose Continue Xifaxan  Hypernatremia currently 149, free water deficit 4.6 L Increase free water (200 mL q 2)  D5 water bolus prn -Continue to monitor electrolytes  Elevated LFTs: currently down trending, RUQ U/S with biliary sludge and mild diffuse gallbladder wall thickening Was thought to be due to seizure medication, but d/t ultrasound findings, cannot r/o acalculous cystitis  HIDA scan-patent cystic duct Continue to trend  Fall with  pathologic T6 fracture and epidural hematoma causing cord compression.  Status post T6 laminectomy and spine stabilization 5/25. Myeloma related with IgG Kappa spike Oncology is seeing patient. 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 Irene Limbo and at [facility] by [name] Work-up for possible multiple myeloma per hematology pathology pending TXA and FFP before procedures  Consider tag to better define clotting disorder. Transfuse for HGB < 7  History of seizure disorder Sound like both tonic-clonic, followed by neurology Continue Keppra and Dilantin per Neuro Pharmacy consult for dilantin  HFrEF, CAD s/p CABG, AICD, atrial fibrillation, hypertension, hyperlipidemia RVR, rate 130 On Levophed at 15 mcg Last echocardiogram in 03/2020 with EF of 30 to 35%, not chronically anti-coagulated given bleeding disorder Amiodarone Prn Metoprolol for additional rate control Wean Levophed for MAP > 65  Daily Goals Checklist  Pain/Anxiety/Delirium protocol (if indicated): stopped all except PRN Fentanyl. Keep off sedative infusions if possible.  Will wean off propofol- gets very tachypneic Neuro vitals: every 2 hours AED's: Continue home AED's VAP protocol (if indicated): bundle in place, chest PT Respiratory support goals: Full support. SBT once mental status improves. Not weaning well Blood pressure target: Allow autoregulation. DVT prophylaxis: SCD's only due to bleeding diathesis.  Nutrition Status: Cortrak in place GI prophylaxis: pantoprazole Fluid status goals: allow autoregulation.  Urinary catheter: Condom Central lines: L IJ CVC Glucose control: T2DM with SSI, Levemir  Mobility/therapy needs: Bedrest then PT once extubated.  Antibiotic de-escalation: Zosyn for 7 days to cover HCAP-completed Home medication  reconciliation:  Home AED, HF meds on hold Daily labs: CBC, BMP Code Status: Full  Family Communication: will call after procedure  Disposition: ICU   Overall prognosis is poor  Labs   CBC: Recent Labs  Lab 05/25/20 0544 05/25/20 0544 05/26/20 0327 05/26/20 0327 05/26/20 2250 05/26/20 2346 05/27/20 0439 05/28/20 0516 05/29/20 0403  WBC 12.3*   < > 11.3*  --  11.1*  --  10.0 12.2* 13.6*  NEUTROABS 8.8*  --  8.4*  --   --   --  7.7 9.6* 11.0*  HGB 8.1*   < > 8.0*   < > 8.2* 9.9* 7.3* 6.6* 7.2*  HCT 25.6*   < > 25.2*   < > 26.6* 29.0* 24.1* 21.2* 22.7*  MCV 93.4   < > 91.3  --  95.3  --  95.3 93.8 93.8  PLT 85*   < > 102*  --  130*  --  130* 172 183   < > = values in this interval not displayed.    Basic Metabolic Panel: Recent Labs  Lab 05/24/20 0525 05/24/20 0525 05/25/20 1140 05/25/20 1140 05/26/20 2250 05/26/20 2346 05/27/20 0439 05/28/20 1030 05/29/20 0403  NA 154*   < > 155*   < > 154* 162* 153* 152* 149*  K 3.9   < > 4.1   < > 4.7 4.7 4.5 3.8 4.0  CL 123*   < > 127*  --  127*  --  123* 122* 126*  CO2 24   < > 24  --  19*  --  21* 21* 19*  GLUCOSE 251*   < > 174*  --  159*  --  208* 168* 177*  BUN 63*   < > 69*  --  62*  --  66* 61* 61*  CREATININE 2.09*   < > 2.12*  --  2.00*  --  2.02* 1.75* 1.61*  CALCIUM 7.0*   < > 7.2*  --  7.8*  --  7.4* 7.6* 7.4*  MG 2.2  --   --   --   --   --   --   --   --    < > = values in this interval not displayed.   The patient is critically ill with multiple organ systems failure and requires high complexity decision making for assessment and support, frequent evaluation and titration of therapies, application of advanced monitoring technologies and extensive interpretation of multiple databases. Critical Care Time devoted to patient care services described in this note independent of APP/resident time (if applicable)  is 32 minutes.   Sherrilyn Rist MD [place] [facility] Personal pager: [phone] If  unanswered, please page CCM On-call: [phone]

## 2020-05-30 ENCOUNTER — Inpatient Hospital Stay: Payer: Self-pay

## 2020-05-30 DIAGNOSIS — R6521 Severe sepsis with septic shock: Secondary | ICD-10-CM

## 2020-05-30 DIAGNOSIS — A419 Sepsis, unspecified organism: Secondary | ICD-10-CM

## 2020-05-30 DIAGNOSIS — J189 Pneumonia, unspecified organism: Secondary | ICD-10-CM

## 2020-05-30 DIAGNOSIS — Y95 Nosocomial condition: Secondary | ICD-10-CM

## 2020-05-30 LAB — CBC WITH DIFFERENTIAL/PLATELET
Abs Immature Granulocytes: 0.38 10*3/uL — ABNORMAL HIGH (ref 0.00–0.07)
Abs Immature Granulocytes: 0.2 10*3/uL — ABNORMAL HIGH (ref 0.00–0.07)
Basophils Absolute: 0 10*3/uL (ref 0.0–0.1)
Basophils Absolute: 0 10*3/uL (ref 0.0–0.1)
Basophils Relative: 0 %
Basophils Relative: 0 %
Eosinophils Absolute: 0.2 10*3/uL (ref 0.0–0.5)
Eosinophils Absolute: 0 10*3/uL (ref 0.0–0.5)
Eosinophils Relative: 1 %
Eosinophils Relative: 0 %
HCT: 22.9 % — ABNORMAL LOW (ref 39.0–52.0)
HCT: 24.9 % — ABNORMAL LOW (ref 39.0–52.0)
Hemoglobin: 7 g/dL — ABNORMAL LOW (ref 13.0–17.0)
Hemoglobin: 7.6 g/dL — ABNORMAL LOW (ref 13.0–17.0)
Immature Granulocytes: 3 %
Lymphocytes Relative: 10 %
Lymphocytes Relative: 16 %
Lymphs Abs: 1.2 10*3/uL (ref 0.7–4.0)
Lymphs Abs: 2.6 10*3/uL (ref 0.7–4.0)
MCH: 29.2 pg (ref 26.0–34.0)
MCH: 30.2 pg (ref 26.0–34.0)
MCHC: 30.6 g/dL (ref 30.0–36.0)
MCHC: 30.5 g/dL (ref 30.0–36.0)
MCV: 95.4 fL (ref 80.0–100.0)
MCV: 98.8 fL (ref 80.0–100.0)
Monocytes Absolute: 0.5 10*3/uL (ref 0.1–1.0)
Monocytes Absolute: 0.8 10*3/uL (ref 0.1–1.0)
Monocytes Relative: 4 %
Monocytes Relative: 5 %
Myelocytes: 1 %
Neutro Abs: 10.3 10*3/uL — ABNORMAL HIGH (ref 1.7–7.7)
Neutro Abs: 12.5 10*3/uL — ABNORMAL HIGH (ref 1.7–7.7)
Neutrophils Relative %: 82 %
Neutrophils Relative %: 78 %
Platelets: 219 10*3/uL (ref 150–400)
Platelets: 244 10*3/uL (ref 150–400)
RBC: 2.4 MIL/uL — ABNORMAL LOW (ref 4.22–5.81)
RBC: 2.52 MIL/uL — ABNORMAL LOW (ref 4.22–5.81)
RDW: 22.7 % — ABNORMAL HIGH (ref 11.5–15.5)
RDW: 23.7 % — ABNORMAL HIGH (ref 11.5–15.5)
WBC: 12.6 10*3/uL — ABNORMAL HIGH (ref 4.0–10.5)
WBC: 16 10*3/uL — ABNORMAL HIGH (ref 4.0–10.5)
nRBC: 12.6 % — ABNORMAL HIGH (ref 0.0–0.2)
nRBC: 19.5 % — ABNORMAL HIGH (ref 0.0–0.2)
nRBC: 23 /100 WBC — ABNORMAL HIGH

## 2020-05-30 LAB — PHENYTOIN LEVEL, TOTAL: Phenytoin Lvl: 6.5 ug/mL — ABNORMAL LOW (ref 10.0–20.0)

## 2020-05-30 LAB — GLUCOSE, CAPILLARY
Glucose-Capillary: 123 mg/dL — ABNORMAL HIGH (ref 70–99)
Glucose-Capillary: 157 mg/dL — ABNORMAL HIGH (ref 70–99)
Glucose-Capillary: 61 mg/dL — ABNORMAL LOW (ref 70–99)
Glucose-Capillary: 69 mg/dL — ABNORMAL LOW (ref 70–99)
Glucose-Capillary: 106 mg/dL — ABNORMAL HIGH (ref 70–99)
Glucose-Capillary: 83 mg/dL (ref 70–99)
Glucose-Capillary: 74 mg/dL (ref 70–99)

## 2020-05-30 LAB — COMPREHENSIVE METABOLIC PANEL
ALT: 70 U/L — ABNORMAL HIGH (ref 0–44)
AST: 52 U/L — ABNORMAL HIGH (ref 15–41)
Albumin: 1.3 g/dL — ABNORMAL LOW (ref 3.5–5.0)
Alkaline Phosphatase: 196 U/L — ABNORMAL HIGH (ref 38–126)
Anion gap: 5 (ref 5–15)
BUN: 60 mg/dL — ABNORMAL HIGH (ref 8–23)
CO2: 20 mmol/L — ABNORMAL LOW (ref 22–32)
Calcium: 7.5 mg/dL — ABNORMAL LOW (ref 8.9–10.3)
Chloride: 123 mmol/L — ABNORMAL HIGH (ref 98–111)
Creatinine, Ser: 1.51 mg/dL — ABNORMAL HIGH (ref 0.61–1.24)
GFR calc Af Amer: 53 mL/min — ABNORMAL LOW (ref >60)
GFR calc non Af Amer: 46 mL/min — ABNORMAL LOW (ref >60)
Glucose, Bld: 118 mg/dL — ABNORMAL HIGH (ref 70–99)
Potassium: 3.8 mmol/L (ref 3.5–5.1)
Sodium: 148 mmol/L — ABNORMAL HIGH (ref 135–145)
Total Bilirubin: 2.4 mg/dL — ABNORMAL HIGH (ref 0.3–1.2)
Total Protein: 8.2 g/dL — ABNORMAL HIGH (ref 6.5–8.1)

## 2020-05-30 LAB — TRIGLYCERIDES: Triglycerides: 102 mg/dL (ref <150)

## 2020-05-30 MED ORDER — DOCUSATE SODIUM 50 MG/5ML PO LIQD: 100.0000 mg | Freq: Every day | ORAL | Status: DC | PRN

## 2020-05-30 MED ORDER — VASOPRESSIN 20 UNIT/ML IV SOLN
0.0300 [IU]/min | INTRAVENOUS | Status: DC
  Administered 2020-05-30 – 2020-06-02 (×4): 0.03 [IU]/min via INTRAVENOUS
  Filled 2020-05-30 (×6): qty 2

## 2020-05-30 MED ORDER — PHENYTOIN 125 MG/5ML PO SUSP
75.0000 mg | Freq: Two times a day (BID) | ORAL | Status: DC
  Administered 2020-05-30 – 2020-06-02 (×7): 75 mg
  Filled 2020-05-30 (×9): qty 4

## 2020-05-30 MED ORDER — FREE WATER
300.0000 mL | Status: DC
  Administered 2020-05-30 – 2020-05-31 (×6): 300 mL

## 2020-05-30 MED ORDER — PIPERACILLIN-TAZOBACTAM 3.375 G IVPB
3.3750 g | Freq: Three times a day (TID) | INTRAVENOUS | Status: DC
  Administered 2020-05-30 – 2020-05-31 (×3): 3.375 g via INTRAVENOUS
  Filled 2020-05-30 (×3): qty 50

## 2020-05-30 MED ORDER — PROPOFOL 1000 MG/100ML IV EMUL
5.0000 ug/kg/min | INTRAVENOUS | Status: DC
  Administered 2020-05-30 (×2): 25 ug/kg/min via INTRAVENOUS
  Administered 2020-05-31: 15 ug/kg/min via INTRAVENOUS
  Administered 2020-06-01: 10 ug/kg/min via INTRAVENOUS
  Administered 2020-06-01: 20 ug/kg/min via INTRAVENOUS
  Administered 2020-06-02: 10 ug/kg/min via INTRAVENOUS
  Filled 2020-05-30 (×7): qty 100

## 2020-05-30 MED ORDER — FUROSEMIDE 10 MG/ML IJ SOLN
40.0000 mg | Freq: Once | INTRAMUSCULAR | Status: AC
  Administered 2020-05-30: 40 mg via INTRAVENOUS
  Filled 2020-05-30: qty 4

## 2020-05-30 MED ORDER — DEXTROSE 50 % IV SOLN
25.0000 mL | Freq: Once | INTRAVENOUS | Status: AC
  Administered 2020-05-30: 25 mL via INTRAVENOUS

## 2020-05-30 MED ORDER — LACTULOSE 10 GM/15ML PO SOLN
30.0000 g | Freq: Two times a day (BID) | ORAL | Status: DC
  Administered 2020-05-30 – 2020-06-02 (×7): 30 g
  Filled 2020-05-30 (×6): qty 45

## 2020-05-30 MED ORDER — SODIUM CHLORIDE 0.9 % IV SOLN: INTRAVENOUS | Status: DC | PRN

## 2020-05-30 MED ORDER — OXYCODONE HCL 5 MG PO TABS
10.0000 mg | ORAL_TABLET | Freq: Three times a day (TID) | ORAL | Status: DC
  Administered 2020-05-30 – 2020-06-02 (×10): 10 mg
  Filled 2020-05-30 (×10): qty 2

## 2020-05-30 MED ORDER — PHENYTOIN 125 MG/5ML PO SUSP
75.0000 mg | Freq: Two times a day (BID) | ORAL | Status: DC
  Filled 2020-05-30: qty 4

## 2020-05-30 MED ORDER — DEXTROSE 50 % IV SOLN
INTRAVENOUS | Status: AC
  Filled 2020-05-30: qty 50

## 2020-05-30 MED ORDER — DEXMEDETOMIDINE HCL IN NACL 400 MCG/100ML IV SOLN
0.4000 ug/kg/h | INTRAVENOUS | Status: DC
  Administered 2020-05-30: 0.4 ug/kg/h via INTRAVENOUS
  Filled 2020-05-30: qty 200

## 2020-05-30 NOTE — Progress Notes (Signed)
RT attempted ABG X3 unable to obtain.  [place] notified.

## 2020-05-30 NOTE — Progress Notes (Addendum)
NAME:  Idrissa Beville., MRN:  [id number], DOB:  08/21/1946, LOS: 32 ADMISSION DATE:  04/27/2020, CONSULTATIO care unit N DATE:  05/30/20 REFERRING MD:  Neurosurgery, CHIEF COMPLAINT:   Fall  Brief History   74 year old male who presented to the emergency department 5/21 after a fall while walking to the bathroom was found to have pathologic compression fracture of T6 and large volume spinal epidural hemorrhage requiring hematoma evacuation and thoracic laminectomy.  He was intubated for procedure and transferred to the intensive care unit postop. This allowed to be myeloma.  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), and Stroke ([place]).   Significant Hospital Events   5/21 admit to neurosurgery 5/22 to ICU postop 5/24 - OR for T4-T8 arthrodesis. T6 found to be infiltrated with tumor 6/3 Tracheostomy placed 6/5 tracheostomy change to 6 XLT  Consults:  PCCM Hematology  Procedures:  5/22 thoracic laminectomy 6/3-tracheostomy 6/5-tracheostomy change to 6 XLT  Significant Diagnostic Tests:  05/22/2020 MR Brain wo Contrast: Negative for acute infarct Chronic hemorrhage in the left insula, left medial temporal lobe, and in the subarachnoid  space. Etiology of the hemorrhage is not apparent. No mass lesion identified.  5/30 MR Spine 10 mm lesion T1 vertebral body and pedicle on the left compatible with myeloma or metastatic disease. No cervical bone lesion. Multilevel degenerative changes in the cervical spine causing spinal and foraminal stenosis as above Posterior epidural hematoma extending to C6-7 from the thoracic spine. This is not causing compression of the visualized spinal cord. This was present on the thoracic MRI. There are postsurgical changes in the posterior soft tissues thoracic spine due to recent surgery.  5/21 CT chest with contrast>>3.7 cm x 1.5 cm low-attenuation mass along the posterior aspect of the left apex with an adjacent expansile lytic area seen within the posterior aspect of the second left rib 5/21 MRI T-spine>>Constellation of multifocal bone Metastases versus Plasmacytoma,a pathologic compression fracture of T6, and a large volume of Spinal Epidural Hemorrhage spanning from the cervical spine throughout the thoracic spine to the conus.  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 5/28 IMPRESSION: 1. Stable remote infarct of the left posterior insular cortex and left superior medial temporal lobe. 2. Stable atrophy and white matter disease. 3. No acute intracranial abnormality or significant interval change. Chest x-ray 6/3 -Right upper lobe haziness, left lower lobe infiltrate  Micro Data:  5/21 MRSA screen>> negative 5/21 SARS-Cov-2>> negative 5/21 urine culture>> serratia marcescens  5/28 endotracheal culture >> pseudomonas aeruginosa  Antimicrobials:  Ceftriaxone 5/21 Zosyn 5/27- 6/3, 6/7 >>  Interim history/subjective:   On low dose Levophed Critically ill, low-grade febrile  Objective   Blood pressure 101/64, pulse 72, temperature (!) 97.5 F (36.4 C),  temperature source Axillary, resp. rate (!) 31, height _0  (1.88 m), weight 103.6 kg, SpO2 92 %.    Vent Mode: PRVC FiO2 (%):  [40 %]  40 % Set Rate:  [18 bmp] 18 bmp Vt Set:  [650 mL] 650 mL PEEP:  [5 cmH20] 5 cmH20 Plateau Pressure:  [18 cmH20-29 cmH20] 18 cmH20   Intake/Output Summary (Last 24 hours) at 05/30/2020 0845 Last data filed at 05/30/2020 0800 Gross per 24 hour  Intake 4241.02 ml  Output 1710 ml  Net 2531.02 ml   Filed Weights   05/27/20 0500 05/29/20 0409 05/30/20 0404  Weight: 100.6 kg 103.6 kg 103.6 kg    GEN:  Unresponsive, critically ill, bleeding via tracheostomy HEENT: Dry oral mucosa, pupils sluggish, no JVD, no icterus CV: tachycardic, irregular rhythm, 1+ pulses PULM:  Bilateral crackles, fair air entry GI: BS+, non-distended, soft EXT: Bilateral lower extremity edema NEURO: no withdrawal to pain, no movement in response to sound, cough with suctioning PSYCH: RASS -3 SKIN: no rashes, lesions or breakdown   Chest x-ray 6/6 personally reviewed which shows no significant change, fissural opacity persists.  Labs show mildly improved hypernatremia, leukocytosis, stable anemia  Resolved Hospital Problem list     Assessment & Plan:   Acute  respiratory failure requiring mechanical ventilation s/p tracheostomy Plan No significant tracheostomy leak noted  Spontaneous breathing trials as able  Septic shock HFrEF, CAD s/p CABG,  atrial fibrillation Last echocardiogram in 03/2020 with EF of 30 to 35%, not chronically anti-coagulated given bleeding disorder Amiodarone Prn Metoprolol for additional rate control Wean Levophed for MAP > 65  Pseudomonas HAP Persistent but improved patchy bilateral opacities.  -CXR 6/3 with left lung base and right upper lobe density similar or slightly progressed since the prior radiograph.  Chest x-ray reviewed by myself.  -RUQ [place] 6/3 with R pleural effusion  -Treated with 7 days of Zosyn until 6/3, respiratory culture from  6/6 again showing GNR and low-grade febrile, will resume Zosyn and treat for 4 more days  Acute  metabolic encephalopathy which is stable.  No seizures on EEG MRI Brain negative for acute infarct, some chronic hemorrhage  Increasing agitation 6/5, Seroquel added  -Continue propofol , dropped BP with Precedex -Enteral oxycodone standing  Hyperammonemia-6/4 77 Decrease lactulose to 30 twice daily Continue Xifaxan  Hypernatremia , improving gradually -Continue free water (200 mL q 2)    Elevated LFTs: currently down trending, RUQ U/S with biliary sludge and mild diffuse gallbladder wall thickening HIDA scan-patent cystic duct ? Related to seizure medication Continue to trend  Fall with pathologic T6 fracture and epidural hematoma causing cord compression.  Status post T6 laminectomy and spine stabilization 5/25. 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 Irene Limbo and at [facility] by [name] Work-up for possible multiple myeloma per hematology pathology pending TXA and FFP before procedures  Consider tag to better define clotting disorder. Transfuse for HGB < 7  History of seizure disorder Sound like both tonic-clonic, followed by neurology Continue Keppra and Dilantin per Neuro  Hypoglycemia -Discontinue insulin and follow CBGs  Overall prognosis is guarded to poor.  Family has been resistant to goals of care discussion  Daily Goals Checklist  Pain/Anxiety/Delirium protocol (if indicated): Precedex, enteral oxycodone  DVT prophylaxis: SCD's only due to bleeding diathesis.  Nutrition Status: Cortrak, TFs GI prophylaxis: pantoprazole Glucose control: T2DM with SSI, Levemir  Mobility/therapy needs: Bedrest Code Status: Full  Family Communication:  Disposition: ICU  The  patient is critically ill with multiple organ systems failure and requires high complexity decision making for assessment and support, frequent evaluation and titration of therapies, application of advanced monitoring technologies and extensive interpretation of multiple databases. Critical Care Time devoted to patient care services described in this note independent of APP/resident  time is 34 minutes.    Kara Mead MD. [place]. [facility]  If no response to pager , please call [phone]   05/30/2020

## 2020-05-30 NOTE — Progress Notes (Signed)
PT Cancellation Note  Patient Details Name: Louis Ford. MRN: [id number] DOB: [birth year]   Cancelled Treatment:    Reason Eval/Treat Not Completed: Medical issues which prohibited therapy; patient tachy, hypotensive and with increased secretions per RN.  Will hold for today and check back another day.   Reginia Naas 05/30/2020, 12:50 PM  Magda Kiel, [place] [phone] 05/30/2020

## 2020-05-30 NOTE — Progress Notes (Signed)
Phenytoin Follow Up Consult Indication: Seizures  No Known Allergies  Patient Measurements: Height: 6' 2"  (188 cm)(Measured by RT) Weight: 103.6 kg (228 lb 6.3 oz) IBW/kg (Calculated) : 82.2 TPN AdjBW (KG): 87 Body mass index is 29.32 kg/m.   Vital signs: Temp: 99.4 F (37.4 C) (06/07 1200) Temp Source: Axillary (06/07 1200) BP: 81/58 (06/07 1415) Pulse Rate: 28 (06/07 1415)  Labs: Lab Results  Component Value Date/Time   Albumin 1.3 (L) 05/30/2020 0507   Phenytoin Lvl 6.5 (L) 05/30/2020 0507   Lab Results  Component Value Date   PHENYTOIN 6.5 (L) 05/30/2020   VALPROATE 21 (L) 05/20/2020   Estimated Creatinine Clearance: 57.6 mL/min (A) (by C-G formula based on SCr of 1.51 mg/dL (H)).   Medications:  Scheduled:   amiodarone  200 mg Per Tube Daily   chlorhexidine gluconate (MEDLINE KIT)  15 mL Mouth Rinse BID   Chlorhexidine Gluconate Cloth  6 each Topical Daily   feeding supplement (PIVOT 1.5 CAL)  1,000 mL Per Tube Q24H   free water  300 mL Per Tube Q4H   insulin aspart  0-9 Units Subcutaneous Q4H   lactulose  30 g Per Tube BID   levETIRAcetam  1,000 mg Per Tube BID   mouth rinse  15 mL Mouth Rinse 10 times per day   multivitamin with minerals  1 tablet Per Tube Daily   oxyCODONE  10 mg Per Tube Q8H   pantoprazole sodium  40 mg Per Tube Daily   phenytoin  75 mg Per Tube BID   polyethylene glycol  17 g Per Tube Daily   QUEtiapine  25 mg Per Tube BID   rifaximin  550 mg Per Tube BID    Assessment:  57 YOM who presented on 5/21 with fall causing epidural hematoma and spinal cord compression. The patient was on phenytoin PTA for hx seizure which was resumed on admission.  The patient was noted to have worsening mentation with elevated LFTs and ammonia on 5/28. Phenytoin levels were checked on 5/28 and 5/31 and noted to be high - likely related to the hepatic function which would effect phenytoin clearance. Levels were again checked on 6/3 AM with  a corrected level of 30 mcg/ml - doses were held at that time.   Repeat daily phenytoin levels were as follows: 6/4 DPH level 11.7, alb 1.6, corrects to 27.8 - continue to hold, recheck in AM 6/5 DPH level 9.4, alb 1.5, corrects to 23.5 - hold 6/6 DPH level 8.4, alb 1.4, corrects to 22.1 - hold 6/7 DPH level 6.5, alb 1.3, corrects to 18 - hold  Since phenytoin level is now therapeutic, will restart phenytoin via tube at a decreased dose   Corrected phenytoin level (if needed): 23.5 Seizure activity: None noted Significant potential drug interactions: n/a  Goals of care:  Total phenytoin level: 10-20 mcg/ml Free phenytoin level: 1-2 mcg/ml  Plan:  - Restart phenytoin 75 mg twice daily via tube - Recheck phenytoin level in 2 days to ensure level is not supratherapeutic   Thank you for involving pharmacy in this patient's care.  Albertina Parr, PharmD., BCPS, BCCCP Clinical Pharmacist Clinical phone for 05/30/20 until 3:30pm: [phone] If after 3:30pm, please refer to [facility] for unit-specific pharmacist

## 2020-05-30 NOTE — Progress Notes (Signed)
eLink Physician-Brief Progress Note Patient Name: [facility]. DOB: 04-18-1946 MRN: [id number]   Date of Service  05/30/2020  HPI/Events of Note  Hypotension - BP = 86/75 with MAP = 78.  eICU Interventions  Will order: 1. Monitor CVP now and Q 4 hours.  2. ABG STAT. 3. CBC with platelets now. 4. Increase ceiling on Norepinephrine IV infusion to 60 mcg/min.     Intervention Category Major Interventions: Hypotension - evaluation and management  Chela Sutphen Cornelia Copa 05/30/2020, 7:51 PM

## 2020-05-30 NOTE — Progress Notes (Signed)
Pharmacy Antibiotic Note  [facility] Louis Ford. is a 74 y.o. male admitted on 05/10/2020 with pneumonia.  Pharmacy has been consulted for Zosyn dosing. Of note, patient completed a 7 day course of Zosyn but MD wants a more prolonged course. WBC improved to 12.6. AF now.   Plan: -Resume Zosyn 3.375 gm IV Q 8 hours (EI infusion) -MOnitor clinical progress   Height: 6\' 2"  (188 cm)(Measured by RT) Weight: 103.6 kg (228 lb 6.3 oz) IBW/kg (Calculated) : 82.2  Temp (24hrs), Avg:99.1 F (37.3 C), Min:97.5 F (36.4 C), Max:100.3 F (37.9 C)  Recent Labs  Lab 05/26/20 2250 05/27/20 0439 05/28/20 0516 05/28/20 1030 05/29/20 0403 05/30/20 0507  WBC 11.1* 10.0 12.2*  --  13.6* 12.6*  CREATININE 2.00* 2.02*  --  1.75* 1.61* 1.51*    Estimated Creatinine Clearance: 57.6 mL/min (A) (by C-G formula based on SCr of 1.51 mg/dL (H)).    No Known Allergies  Antimicrobials this admission: Zosyn 5/28 >> 6/3; 6/7>>   Dose adjustments this admission:  Microbiology results: 6/6 TA >> moderate GNR 5/28 TA >> abundant pseudomonas  5/21 UCx >> 100K serratia marcescens   Thank you for allowing pharmacy to be a part of this patient's care.  Albertina Parr, PharmD., BCPS, BCCCP Clinical Pharmacist Clinical phone for 05/30/20 until 3:30pm: [phone] If after 3:30pm, please refer to [facility] for unit-specific pharmacist

## 2020-05-30 NOTE — Progress Notes (Signed)
eLink Physician-Brief Progress Note Patient Name: Louis Ford. DOB: [birth year] MRN: [id number]   Date of Service  05/30/2020  HPI/Events of Note  AM labs ordered per request.  eICU Interventions       Intervention Category Minor Interventions: Other:  Louis Ford 05/30/2020, 3:35 AM

## 2020-05-30 NOTE — Progress Notes (Signed)
HEMATOLOGY/ONCOLOGY NOTE  Date of Service: 05/30/2020  Patient Care Team: [name], MD as PCP - General (Internal Medicine) [name], MD as PCP - Electrophysiology (Cardiology) [name], [name], MD as PCP - Advanced Heart Failure (Cardiology) [name], MD as Consulting Physician (Neurology)  REFERRING PHYSICIAN: No ref. provider found  CHIEF COMPLAINTS/PURPOSE OF CONSULTATION:  Bleeding Disorder, Iron deficiency, Monoclonal Protein Spike  HISTORY OF PRESENTING ILLNESS:   Louis Cirugia [employer] [employer] is a 74 y.o. male referred to [place] for a bleeding disorder, iron deficiency anemia, and monoclonal protein spike.  Last seen in our office on 12/29/2019.  Missed an appointment with [place] in early April 2021.  Seen by [name] at [facility] at the end of January 2021 for his bleeding disorder.  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  There was also felt to be a defect in platelet function as well as slight deficiency in alcohol to antiplasmin.  He was felt to be very high risk for bleeding for any procedures.  Recommendations were for 1 to 2 units of platelets as well as antifibrinolytic's and a small amount of plasma for procedures.  Admitted following a fall with epidural hematoma to the thoracic spine, T6 compression fracture and paraplegia of the lower extremities.  Went to the OR on 05/14/2020 for thoracic laminectomy for hematoma evacuation.  He also had an epidural tumor removed.  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  He received 3 units FFP and 2 units of platelets in the perioperative period.  Had a drop in his hemoglobin postoperatively but had minimal blood on the dressing and drain output was standard for this type of surgery.  Went back to the OR on 05/07/2020 and did not have any significant bleeding.  On 05/17/2020 kappa/lambda light chains showed an elevated kappa, lambda light chain ratio of 3.10, M spike 3.0  g/dL, UPEP performed on 05/18/2020 showed an elevated free kappa/lambda ratio at 11.05, urine M spike % at 23% and M spike of 30 mg per 24 hours.  Tracheostomy performed on 05/26/2020.  Remains on ventilator and pressors.  He was restarted on IV antibiotics due to respiratory culture with gram-negative rods and low-grade fevers.  Today he is lethargic, does not follow commands.  No family at the bedside.  MEDICAL HISTORY:  Past Medical History:  Diagnosis Date  . AICD (automatic cardioverter/defibrillator) present   . Atrial fibrillation   09/22/2012  . Blood loss anemia 04/18/2017   After GI bleed from colonoscopy and polypectomy  . CAD (coronary artery disease)   . Chronic systolic heart failure ([place])   . 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 GIB (gastrointestinal bleeding) 02/20/2017  . History of MRSA infection 05/2011  . HTN (hypertension)   . Hx of adenomatous colonic polyps 02/20/2017   01/2017 - 3 cm sigmoid TV adenoma and other smaller polyps - had post-polypectomy bleed Tx w/ clips Consider repeat colonoscopy 3 yrs [name], MD, [name]   . Hyperlipidemia   . implantable cardiac defibrillator-Biotronik    Device Implanted 2006; s/p gen change 03/2011 : bleeding persistent with pocket erosion and infection; explant and reimplant  06/2011  . Ischemic cardiomyopathy    EF 15 to 20% by TTE and TEE in 09/2012.  Severe LV dysfunction  . Obesity    BMI 31 in 09/2012  . Persistent atrial fibrillation with rapid ventricular response ([place])  04/21/2014  . Retroperitoneal bleed 04/21/2019  . Seizure disorder ([place]) latest 09/30/2012  . Seizures ([place])   . Stroke [facility])      SURGICAL HISTORY: Past Surgical History:  Procedure Laterality Date  . ALVEOLOPLASTY N/A 07/26/2019   Procedure: ALVEOLOPLASTY WITH RESUTURING OF ORAL WOUND;  Surgeon: [name], [name];  Location: [facility];  Service: Oral Surgery;  Laterality: N/A;  . CARDIAC CATHETERIZATION  N/A 10/12/2015   Procedure: Right Heart Cath;  Surgeon: [name], MD;  Location: [place] CV LAB;  Service: Cardiovascular;  Laterality: N/A;  . CARDIOVERSION  09/24/2012   Procedure: CARDIOVERSION;  Surgeon: [name], MD;  Location: [facility] ENDOSCOPY;  Service: Cardiovascular;  Laterality: N/A;  . CARDIOVERSION N/A 12/25/2017   Procedure: CARDIOVERSION;  Surgeon: [name], MD;  Location: [facility];  Service: Cardiovascular;  Laterality: N/A;  . COLONOSCOPY W/ POLYPECTOMY  02/13/2017  . CORONARY ARTERY BYPASS GRAFT  2011   in [place] N/A 08/12/2019   Procedure: CAUTERIZATION OF ORAL BLEEDING;  Surgeon: [name], [name];  Location: [facility];  Service: Oral Surgery;  Laterality: N/A;  . FLEXIBLE SIGMOIDOSCOPY N/A 02/20/2017   Procedure: FLEXIBLE SIGMOIDOSCOPY;  Surgeon: [name], MD;  Location: [facility] ENDOSCOPY;  Service: Endoscopy;  Laterality: N/A;  . FLEXIBLE SIGMOIDOSCOPY N/A 02/21/2017   Procedure: FLEXIBLE SIGMOIDOSCOPY;  Surgeon: [name], MD;  Location: [facility] ENDOSCOPY;  Service: Endoscopy;  Laterality: N/A;  . ICD    . ICD GENERATOR CHANGEOUT N/A 09/10/2019   Procedure: [place];  Surgeon: [name], MD;  Location: [place] CV LAB;  Service: Cardiovascular;  Laterality: N/A;  . ICD LEAD REMOVAL Right 11/13/2019   Procedure: ICD EXTRACTION and Lead Extraction;  Surgeon: [name], MD;  Location: [place];  Service: Cardiovascular;  Laterality: Right;  Dr. Servando Snare for backup  . LAMINECTOMY WITH POSTERIOR LATERAL ARTHRODESIS LEVEL 4 N/A 05/09/2020   Procedure: THORACIC FOUR-THORACIC EIGHT POSTERIOR LATERAL ARTHRODESIS;  Surgeon: [name], MD;  Location: [place];  Service: Neurosurgery;  Laterality: N/A;  . POCKET REVISION/RELOCATION N/A 09/17/2019   Procedure: POCKET REVISION/RELOCATION;  Surgeon: Thompson Grayer, MD;  Location: [place] [facility] LAB;  Service: Cardiovascular;  Laterality: N/A;  . SUBMANDIBULAR GLAND EXCISION N/A 08/08/2019    Procedure: suture of oral wounds;  Surgeon: [name], [name];  Location: [facility];  Service: Oral Surgery;  Laterality: N/A;  . TEE WITHOUT CARDIOVERSION  09/24/2012   Procedure: TRANSESOPHAGEAL ECHOCARDIOGRAM (TEE);  Surgeon: [name], MD;  Location: [place];  Service: Cardiovascular;  Laterality: N/A;  dave/anesth, dl, cindy/echo   . TEE WITHOUT CARDIOVERSION N/A 11/12/2019   Procedure: TRANSESOPHAGEAL ECHOCARDIOGRAM (TEE);  Surgeon: [name], MD;  Location: [facility] ENDOSCOPY;  Service: Cardiovascular;  Laterality: N/A;  . TEE WITHOUT CARDIOVERSION N/A 11/13/2019   Procedure: TRANSESOPHAGEAL ECHOCARDIOGRAM (TEE);  Surgeon: [name], MD;  Location: [place];  Service: Cardiovascular;  Laterality: N/A;  . THORACIC LAMINECTOMY FOR EPIDURAL ABSCESS N/A 05/02/2020   Procedure: THORACIC LAMINECTOMY FOR HEMATOMA EVACUATION;  Surgeon: [name], MD;  Location: [place];  Service: Neurosurgery;  Laterality: N/A;     SOCIAL HISTORY: Social History   Socioeconomic History  . Marital status: Married    Spouse name: Not on file  . Number of children: Not on file  . Years of education: Not on file  . Highest education level: Not on file  Occupational History  . Not on file  Tobacco Use  .  Smoking status: Former Smoker    Types: Pipe    Quit date: 09/21/2008    Years since quitting: 11.6  . Smokeless tobacco: Former Systems developer    Quit date: 09/21/2008  Substance and Sexual Activity  . Alcohol use: No  . Drug use: No  . Sexual activity: Not Currently    Birth control/protection: None  Other Topics Concern  . Not on file  Social History Narrative   Right handed   Apartment ground floor   Drinks caffeine       Social Determinants of Health   Financial Resource Strain:   . Difficulty of Paying Living Expenses:   Food Insecurity:   . Worried About Charity fundraiser in the Last Year:   . Arboriculturist in the Last Year:   Transportation Needs:   . Lexicographer (Medical):   [name] Lack of Transportation (Non-Medical):   Physical Activity:   . Days of Exercise per Week:   . Minutes of Exercise per Session:   Stress:   . Feeling of Stress :   Social Connections:   . Frequency of Communication with Friends and Family:   . Frequency of Social Gatherings with Friends and Family:   . Attends Religious Services:   . Active Member of Clubs or Organizations:   . Attends Archivist Meetings:   [name] Marital Status:   Intimate Partner Violence:   . Fear of Current or Ex-Partner:   . Emotionally Abused:   [name] Physically Abused:   . Sexually Abused:      FAMILY HISTORY: Family History  Problem Relation Age of Onset  . Arthritis Mother   . Heart disease Mother   . Heart attack Mother   . Other Father        smoker  . Hypertension Neg Hx        unknown  . Stroke Neg Hx        unknown     ALLERGIES:   has No Known Allergies.   MEDICATIONS:  Current Facility-Administered Medications  Medication Dose Route Frequency Provider Last Rate Last Admin  . 0.9 %  sodium chloride infusion   Intravenous PRN [name], MD 5 mL/hr at 05/30/20 1400 Rate Verify at 05/30/20 1400  . 0.9 %  sodium chloride infusion   Intra-arterial PRN Rigoberto Noel, MD      . amiodarone (PACERONE) tablet 200 mg  200 mg Per Tube Daily Olalere, Adewale A, MD   200 mg at 05/30/20 0910  . chlorhexidine gluconate (MEDLINE KIT) (PERIDEX) 0.12 % solution 15 mL  15 mL Mouth Rinse BID [name], MD   15 mL at 05/30/20 0736  . Chlorhexidine Gluconate Cloth 2 % PADS 6 each  6 each Topical Daily [name], MD   6 each at 05/30/20 0018  . docusate (COLACE) 50 MG/5ML liquid 100 mg  100 mg Per Tube Daily PRN Rigoberto Noel, MD      . feeding supplement (PIVOT 1.5 CAL) liquid 1,000 mL  1,000 mL Per Tube Q24H Agarwala, Ravi, MD 60 mL/hr at 05/30/20 1330 1,000 mL at 05/30/20 1330  . fentaNYL (SUBLIMAZE) injection 25-100 mcg  25-100 mcg Intravenous Q30 min PRN  Corey Harold, NP   100 mcg at 05/30/20 0910  . free water 300 mL  300 mL Per Tube Q4H Rigoberto Noel, MD   300 mL at 05/30/20 1200  . insulin aspart (novoLOG) injection 0-9 Units  0-9  Units Subcutaneous Q4H [name], MD   1 Units at 05/30/20 0345  . lactulose (CHRONULAC) 10 GM/15ML solution 30 g  30 g Per Tube BID Rigoberto Noel, MD   30 g at 05/30/20 0908  . levETIRAcetam (KEPPRA) 100 MG/ML solution 1,000 mg  1,000 mg Per Tube BID Kipp Brood, MD   1,000 mg at 05/30/20 0910  . MEDLINE mouth rinse  15 mL Mouth Rinse 10 times per day Corey Harold, NP   15 mL at 05/30/20 1337  . metoprolol tartrate (LOPRESSOR) injection 5 mg  5 mg Intravenous Q6H PRN Magdalen Spatz, NP   5 mg at 05/30/20 0910  . midazolam (VERSED) injection 1 mg  1 mg Intravenous Q4H PRN Judd Lien, MD   1 mg at 05/30/20 0201  . multivitamin with minerals tablet 1 tablet  1 tablet Per Tube Daily Kipp Brood, MD   1 tablet at 05/29/20 2130  . norepinephrine (LEVOPHED) 16 mg in 268m premix infusion  0-40 mcg/min Intravenous Titrated CAshok Pall MD 37.5 mL/hr at 05/30/20 1400 40 mcg/min at 05/30/20 1400  . ondansetron (ZOFRAN) tablet 4 mg  4 mg Per Tube Q6H PRN CAshok Pall MD       Or  . ondansetron (ZOFRAN) injection 4 mg  4 mg Intravenous Q6H PRN CAshok Pall MD      . oxyCODONE (Oxy IR/ROXICODONE) immediate release tablet 10 mg  10 mg Per Tube Q8H ARigoberto Noel MD   10 mg at 05/30/20 1330  . oxyCODONE (Oxy IR/ROXICODONE) immediate release tablet 5 mg  5 mg Per Tube Q3H PRN CAshok Pall MD      . pantoprazole sodium (PROTONIX) 40 mg/20 mL oral suspension 40 mg  40 mg Per Tube Daily AKipp Brood MD   40 mg at 05/30/20 0910  . phenytoin (DILANTIN) 125 MG/5ML suspension 75 mg  75 mg Per Tube BID CAshok Pall MD   75 mg at 05/30/20 1105  . piperacillin-tazobactam (ZOSYN) IVPB 3.375 g  3.375 g Intravenous Q8H MLavenia Atlas RPH   Stopped at 05/30/20 1326  . polyethylene glycol (MIRALAX /  GLYCOLAX) packet 17 g  17 g Per Tube Daily AKipp Brood MD   Stopped at 05/26/20 1000  . propofol (DIPRIVAN) 1000 MG/100ML infusion  5-80 mcg/kg/min Intravenous Titrated AKara MeadV, MD 15.09 mL/hr at 05/30/20 1400 25 mcg/kg/min at 05/30/20 1400  . QUEtiapine (SEROQUEL) tablet 25 mg  25 mg Per Tube BID CNoemi ChapelP, DO   25 mg at 05/30/20 0910  . rifaximin (XIFAXAN) tablet 550 mg  550 mg Per Tube BID Olalere, Adewale A, MD   550 mg at 05/30/20 0910  . vasopressin (PITRESSIN) 40 Units in sodium chloride 0.9 % 250 mL (0.16 Units/mL) infusion  0.03 Units/min Intravenous Continuous ARigoberto Noel MD         REVIEW OF SYSTEMS:   Unable to obtain a comprehensive review of systems secondary to patient sedation.   PHYSICAL EXAMINATION: ECOG PERFORMANCE STATUS: 4 - Bedbound  Vitals:   05/30/20 1400 05/30/20 1415  BP: (!) 84/55 (!) 81/58  Pulse: (!) 121 (!) 28  Resp: (!) 31 (!) 28  Temp:    SpO2: 91% 93%   Filed Weights   05/27/20 0500 05/29/20 0409 05/30/20 0404  Weight: 100.6 kg 103.6 kg 103.6 kg   Body mass index is 29.32 kg/m.  GENERAL: Unresponsive, critically ill HEENT: Dry oral mucosa LUNGS: Bilateral rales HEART: Tachycardic, edema in all 4  extremities  ABDOMEN:  normoactive bowel sounds , non tender, not distended Neuro: Unresponsive. Does not withdraw to pain.  LABORATORY DATA:  I have reviewed the data as listed  CBC Latest Ref Rng & Units 05/30/2020 05/29/2020 05/28/2020  WBC 4.0 - 10.5 K/uL 12.6(H) 13.6(H) 12.2(H)  Hemoglobin 13.0 - 17.0 g/dL 7.0(L) 7.2(L) 6.6(LL)  Hematocrit 39.0 - 52.0 % 22.9(L) 22.7(L) 21.2(L)  Platelets 150 - 400 K/uL 219 183 172    CMP Latest Ref Rng & Units 05/30/2020 05/29/2020 05/28/2020  Glucose 70 - 99 mg/dL 118(H) 177(H) 168(H)  BUN 8 - 23 mg/dL 60(H) 61(H) 61(H)  Creatinine 0.61 - 1.24 mg/dL 1.51(H) 1.61(H) 1.75(H)  Sodium 135 - 145 mmol/L 148(H) 149(H) 152(H)  Potassium 3.5 - 5.1 mmol/L 3.8 4.0 3.8  Chloride 98 - 111 mmol/L 123(H)  126(H) 122(H)  CO2 22 - 32 mmol/L 20(L) 19(L) 21(L)  Calcium 8.9 - 10.3 mg/dL 7.5(L) 7.4(L) 7.6(L)  Total Protein 6.5 - 8.1 g/dL 8.2(H) 7.9 -  Total Bilirubin 0.3 - 1.2 mg/dL 2.4(H) 2.4(H) -  Alkaline Phos 38 - 126 U/L 196(H) 158(H) -  AST 15 - 41 U/L 52(H) 53(H) -  ALT 0 - 44 U/L 70(H) 91(H) -   Component     Latest Ref Rng & Units 04/17/2017 11/27/2017 07/22/2019 07/29/2019  IgG (Immunoglobin G), Serum     603 - 1,613 mg/dL    2,155 (H)  IgA     61 - 437 mg/dL    320  IgM (Immunoglobulin M), Srm     20 - 172 mg/dL    10 (L)  Total Protein ELP     6.0 - 8.5 g/dL    6.0  Albumin SerPl Elph-Mcnc     2.9 - 4.4 g/dL    2.5 (L)  Alpha 1     0.0 - 0.4 g/dL    0.2  Alpha2 Glob SerPl Elph-Mcnc     0.4 - 1.0 g/dL    0.5  B-Globulin SerPl Elph-Mcnc     0.7 - 1.3 g/dL    0.5 (L)  Gamma Glob SerPl Elph-Mcnc     0.4 - 1.8 g/dL    2.2 (H)  M Protein SerPl Elph-Mcnc     Not Observed g/dL    1.6 (H)  Globulin, Total     2.2 - 3.9 g/dL    3.5  Albumin/Glob SerPl     0.7 - 1.7    0.8  IFE 1         Comment  Please Note (HCV):         Comment  Anticardiolipin Ab,IgA,Qn     0 - 11 APL U/mL      Anticardiolipin Ab,IgG,Qn     0 - 14 GPL U/mL      Anticardiolipin Ab,IgM,Qn     0 - 12 MPL U/mL      PTT Lupus Anticoagulant     0.0 - 51.9 sec      DRVVT     0.0 - 47.0 sec      Phosphatydalserine, IgG     0 - 11 GPS IgG      Phosphatydalserine, IgM     0 - 25 MPS IgM      Phosphatydalserine, IgA     0 - 20 APS IgA      Lupus Anticoag Interp           APTT     22.9 - 30.2 sec  aPTT 1:1 Normal Plasma     22.9 - 30.2 sec      aPTT 1:1 Mix Saline           aPTT 1:1 NP Mix, 60 Min,Incub.     22.9 - 30.2 sec      aPTT 1:1 NP Incub. Mix Ctl     22.9 - 30.2 sec      Coagulation Factor VIII     56 - 140 %    182 (H)  Ristocetin Co-factor, Plasma     50 - 200 %    91  Von Willebrand Antigen, Plasma     50 - 200 %    117  PT     9.6 - 11.5 sec      PT 1:1NP     9.6 - 11.5 sec       1 HR INCUB PT 1:1NP     9.6 - 11.5 sec      PFA Interpretation           Collagen / Epinephrine     0 - 193 seconds    132  Ferritin     24 - 336 ng/mL 13.0 (L)   28  Vitamin B12     211 - 911 pg/mL  321    Factor VII Activity     51 - 186 %   69   Factor XIII, Qualitative     No Lysis/24    NORMAL  Von Willebrand Multimers         Comment  Factor X Activity     76 - 183 %      Factor V Activity     70 - 150 %      Factor II Activity     50 - 154 %      THROMBIN TIME     0.0 - 23.0 sec      Interpretation         Note  Sed Rate     0 - 16 mm/hr       Component     Latest Ref Rng & Units 07/31/2019 09/24/2019  IgG (Immunoglobin G), Serum     603 - 1,613 mg/dL    IgA     61 - 437 mg/dL    IgM (Immunoglobulin M), Srm     20 - 172 mg/dL    Total Protein ELP     6.0 - 8.5 g/dL    Albumin SerPl Elph-Mcnc     2.9 - 4.4 g/dL    Alpha 1     0.0 - 0.4 g/dL    Alpha2 Glob SerPl Elph-Mcnc     0.4 - 1.0 g/dL    B-Globulin SerPl Elph-Mcnc     0.7 - 1.3 g/dL    Gamma Glob SerPl Elph-Mcnc     0.4 - 1.8 g/dL    M Protein SerPl Elph-Mcnc     Not Observed g/dL    Globulin, Total     2.2 - 3.9 g/dL    Albumin/Glob SerPl     0.7 - 1.7    IFE 1         Please Note (HCV):         Anticardiolipin Ab,IgA,Qn     0 - 11 APL U/mL <9   Anticardiolipin Ab,IgG,Qn     0 - 14 GPL U/mL <9   Anticardiolipin Ab,IgM,Qn     0 - 12 MPL U/mL <9  PTT Lupus Anticoagulant     0.0 - 51.9 sec 64.2 (H)   DRVVT     0.0 - 47.0 sec 41.0   Phosphatydalserine, IgG     0 - 11 GPS IgG 3   Phosphatydalserine, IgM     0 - 25 MPS IgM 2   Phosphatydalserine, IgA     0 - 20 APS IgA 2   Lupus Anticoag Interp      Comment:   APTT     22.9 - 30.2 sec 35.8 (H)   aPTT 1:1 Normal Plasma     22.9 - 30.2 sec 32.4 (H)   aPTT 1:1 Mix Saline      NOT PERFORMED   aPTT 1:1 NP Mix, 60 Min,Incub.     22.9 - 30.2 sec 35.0 (H)   aPTT 1:1 NP Incub. Mix Ctl     22.9 - 30.2 sec 33.0 (H)   Coagulation  Factor VIII     56 - 140 %    Ristocetin Co-factor, Plasma     50 - 200 %    Von Willebrand Antigen, Plasma     50 - 200 %    PT     9.6 - 11.5 sec 12.5 (H)   PT 1:1NP     9.6 - 11.5 sec 10.9   1 HR INCUB PT 1:1NP     9.6 - 11.5 sec 11.4   PFA Interpretation         Collagen / Epinephrine     0 - 193 seconds    Ferritin     24 - 336 ng/mL  122  Vitamin B12     211 - 911 pg/mL    Factor VII Activity     51 - 186 %    Factor XIII, Qualitative     No Lysis/24    Von Willebrand Multimers         Factor X Activity     76 - 183 % 95   Factor V Activity     70 - 150 % 64 (L)   Factor II Activity     50 - 154 % 82   THROMBIN TIME     0.0 - 23.0 sec 17.8   Interpretation         Sed Rate     0 - 16 mm/hr  16    RADIOGRAPHIC STUDIES: I have personally reviewed the radiological images as listed and agreed with the findings in the report. CT HEAD WO CONTRAST  Result Date: 05/20/2020 CLINICAL DATA:  Recent fall. Spine fracture. Question intracranial hemorrhage. EXAM: CT HEAD WITHOUT CONTRAST TECHNIQUE: Contiguous axial images were obtained from the base of the skull through the vertex without intravenous contrast. COMPARISON:  CT head without contrast 05/19/2020 FINDINGS: Brain: Remote infarct of the left posterior insular cortex and left superior medial temporal lobe is stable. No acute infarct, hemorrhage, or mass lesion is present. Atrophy and white matter disease is stable. The ventricles are proportionate to the degree of atrophy. No significant extraaxial fluid collection is present. The brainstem and cerebellum are within normal limits. Vascular: Extensive vascular calcifications are present within the cavernous internal carotid arteries and extending into the left MCA. No hyperdense vessel is present. Skull: Calvarium is intact. No focal lytic or blastic lesions are present. No significant extracranial soft tissue lesion is present. Sinuses/Orbits: Chronic mucosal thickening is  present in the right maxillary sinus. There is some chronic wall thickening of the left maxillary  sinus. The paranasal sinuses and mastoid air cells are otherwise clear. The globes and orbits are within normal limits. IMPRESSION: 1. Stable remote infarct of the left posterior insular cortex and left superior medial temporal lobe. 2. Stable atrophy and white matter disease. 3. No acute intracranial abnormality or significant interval change. Electronically Signed   By: San Morelle M.D.   On: 05/20/2020 09:15   CT HEAD WO CONTRAST  Result Date: 04/28/2020 CLINICAL DATA:  Fall, back pain, history of retroperitoneal bleeding. EXAM: CT HEAD WITHOUT CONTRAST CT CERVICAL SPINE WITHOUT CONTRAST TECHNIQUE: Multidetector CT imaging of the head and cervical spine was performed following the standard protocol without intravenous contrast. Multiplanar CT image reconstructions of the cervical spine were also generated. COMPARISON:  Prior head CT examinations 07/20/2019 and earlier, chest CT 10/29/2019. 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 Stable ill-defined hypoattenuation within the cerebral white matter which is nonspecific, but consistent with chronic small vessel ischemic disease. Stable, mild generalized parenchymal atrophy. There is no acute intracranial hemorrhage. No acute demarcated cortical infarct. No extra-axial fluid collection. No evidence of intracranial mass. No midline shift. Vascular: No hyperdense vessel.  Atherosclerotic calcifications. Skull: Normal. Negative for fracture or focal lesion. Sinuses/Orbits: Visualized orbits show no acute finding. Mild ethmoid sinus mucosal thickening. No significant mastoid effusion. CT CERVICAL SPINE FINDINGS Alignment:  Straightening of the expected cervical lordosis. Minimal C7-T1 anterolisthesis. Skull base and vertebrae: The basion-dental and atlanto-dental intervals are maintained.No evidence of acute fracture to the cervical spine. Soft tissues and spinal canal: No prevertebral fluid or swelling. No visible canal hematoma. Disc levels: Cervical spondylosis with multilevel disc space narrowing, posterior disc osteophytes, uncovertebral and facet hypertrophy. Disc space narrowing is advanced at C3-C4 and C4-C5. Degenerative fusion of the C5-C6 vertebral bodies. Upper chest: Reported separately. No consolidation within the imaged lung apices. No visible pneumothorax. Prior median sternotomy. Indeterminate 9 mm well-circumscribed lucent focus within the posterior T1 vertebral body. Incompletely imaged irregular lucency within the posterior left second rib. IMPRESSION: CT head: 1. No evidence of acute intracranial abnormality. 2. 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 3. Stable mild generalized parenchymal atrophy and chronic small vessel ischemic disease. 4. Mild ethmoid sinus mucosal thickening. Cervical spine: 1. No evidence of acute fracture to the cervical spine. 2. Indeterminate 9 mm well-circumscribed lucent lesion within the posterior T1 vertebral body. Additionally, there is incompletely imaged irregular lucency within the posterior left second rib. Please refer to separately reported CT chest. 3. Cervical spondylosis as described. Degenerative C5-C6 fusion. 4. Minimal C7-T1 grade 1 anterolisthesis. Electronically Signed   By: Kellie Simmering DO   On: 05/12/2020 18:43   CT Chest W Contrast  Result Date: 04/27/2020 CLINICAL DATA:  Status post fall. EXAM: CT CHEST, ABDOMEN, AND PELVIS WITH CONTRAST TECHNIQUE: Multidetector CT imaging of the chest, abdomen and pelvis was performed following the standard protocol during bolus administration of  intravenous contrast. CONTRAST:  154m OMNIPAQUE IOHEXOL 300 MG/ML  SOLN COMPARISON:  None. FINDINGS: CT CHEST FINDINGS Cardiovascular: There is moderate severity calcification of the aortic arch. There is moderate severity cardiomegaly. No pericardial effusion. Mediastinum/Nodes: No enlarged mediastinal, hilar, or axillary lymph nodes. Thyroid gland, trachea, and esophagus demonstrate no significant findings. Lungs/Pleura: A 3.7 cm x 1.5 cm low-attenuation mass is seen  along the posterior aspect of the left apex. An adjacent expansile lytic area is seen within the posterior aspect of the second left rib (axial CT image 15, CT series number 3). Moderate severity atelectasis and/or infiltrate is seen within the left lower lobe. There is no evidence of a pleural effusion or pneumothorax. Musculoskeletal: Multiple sternal wires are seen. A 4.3 cm x 2.2 cm intramuscular lipoma is seen along the anterior left chest wall. Degenerative changes are seen throughout the thoracic spine. CT ABDOMEN PELVIS FINDINGS Hepatobiliary: No focal liver abnormality is seen. No gallstones, gallbladder wall thickening, or biliary dilatation. Pancreas: Unremarkable. No pancreatic ductal dilatation or surrounding inflammatory changes. Spleen: Normal in size without focal abnormality. Adrenals/Urinary Tract: Adrenal glands are unremarkable. Innumerable cysts of various sizes are seen within both kidneys. There is no evidence of renal calculi or hydronephrosis. Bladder is unremarkable. Stomach/Bowel: Stomach is within normal limits. Appendix appears normal. No evidence of bowel wall thickening, distention, or inflammatory changes. Vascular/Lymphatic: There is moderate severity calcification and atherosclerosis throughout the abdominal aorta. No enlarged abdominal or pelvic lymph nodes. Reproductive: The prostate gland is mildly enlarged. Other: No abdominal wall hernia or abnormality. No abdominopelvic ascites. Musculoskeletal: Multilevel  degenerative changes are seen throughout the lumbar spine. IMPRESSION: 1. 3.7 cm x 1.5 cm low-attenuation mass along the posterior aspect of the left apex with an adjacent expansile lytic area seen within the posterior aspect of the second left rib. 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 2. Moderate severity atelectasis and/or infiltrate within the left lower lobe. 3. Innumerable cysts of various sizes within both kidneys consistent with polycystic kidney disease. 4. 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 5. Mild enlargement of the prostate gland. 6. Aortic atherosclerosis. Aortic Atherosclerosis (ICD10-I70.0). Electronically Signed   By: Virgina Norfolk M.D.   On: 05/12/2020 18:34   CT CERVICAL SPINE WO CONTRAST  Result Date: 05/05/2020 CLINICAL DATA:  Fall, back pain, history of retroperitoneal bleeding. EXAM: CT HEAD WITHOUT CONTRAST CT CERVICAL SPINE WITHOUT CONTRAST TECHNIQUE: Multidetector CT imaging of the head and cervical spine was performed following the standard protocol without intravenous contrast. Multiplanar CT image reconstructions of the cervical spine were also generated. COMPARISON:  Prior head CT examinations 07/20/2019 and earlier, chest CT 10/29/2019. 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 Stable ill-defined hypoattenuation within the cerebral white matter which is nonspecific, but consistent with chronic small vessel ischemic disease. Stable, mild generalized parenchymal atrophy. There is no acute intracranial hemorrhage. No acute demarcated cortical infarct. No extra-axial fluid collection. No evidence of intracranial mass. No midline shift. Vascular: No  hyperdense vessel.  Atherosclerotic calcifications. Skull: Normal. Negative for fracture or focal lesion. Sinuses/Orbits: Visualized orbits show no acute finding. Mild ethmoid sinus mucosal thickening. No significant mastoid effusion. CT CERVICAL SPINE FINDINGS Alignment: Straightening of the expected cervical lordosis. Minimal C7-T1 anterolisthesis. Skull base and vertebrae: The basion-dental and atlanto-dental intervals are maintained.No evidence of acute fracture to the cervical spine. Soft tissues and spinal canal: No prevertebral fluid or swelling. No visible canal hematoma. Disc levels: Cervical spondylosis with multilevel disc space narrowing, posterior disc osteophytes, uncovertebral and facet hypertrophy. Disc space narrowing is advanced at C3-C4 and C4-C5. Degenerative fusion of the C5-C6 vertebral bodies. Upper chest: Reported  separately. No consolidation within the imaged lung apices. No visible pneumothorax. Prior median sternotomy. Indeterminate 9 mm well-circumscribed lucent focus within the posterior T1 vertebral body. Incompletely imaged irregular lucency within the posterior left second rib. IMPRESSION: CT head: 1. No evidence of acute intracranial abnormality. 2. 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 3. Stable mild generalized parenchymal atrophy and chronic small vessel ischemic disease. 4. Mild ethmoid sinus mucosal thickening. Cervical spine: 1. No evidence of acute fracture to the cervical spine. 2. Indeterminate 9 mm well-circumscribed lucent lesion within the posterior T1 vertebral body. Additionally, there is incompletely imaged irregular lucency within the posterior left second rib. Please refer to separately reported CT chest. 3. Cervical spondylosis as described. Degenerative C5-C6 fusion. 4. Minimal C7-T1 grade 1 anterolisthesis. Electronically Signed   By: Kellie Simmering DO   On: 05/21/2020 18:43   MR BRAIN  WO CONTRAST  Result Date: 05/22/2020 CLINICAL DATA:  Encephalopathy.  Altered mental status. EXAM: MRI HEAD WITHOUT CONTRAST TECHNIQUE: Multiplanar, multiecho pulse sequences of the brain and surrounding structures were obtained without intravenous contrast. COMPARISON:  CT head 04/30/2020 FINDINGS: Brain: Negative for acute infarct. Mild chronic microvascular ischemic change in the white matter. Mild generalized atrophy. Chronic hemorrhage in the left sylvian fissure. There is chronic hemorrhage in the insular cortex on the left as well as subarachnoid hemorrhage in the sylvian fissure and in left parietal lobe. Small amount of the subarachnoid hemorrhage right parietal lobe. No underlying mass lesion is identified. Etiology of the hemorrhage is not apparent. There is chronic hemorrhage in the left medial temporal lobe with surrounding gliosis. This could be a cause of seizure. Vascular: Normal arterial flow voids Skull and upper cervical spine: No focal skeletal lesion. Sinuses/Orbits: Moderate mucosal edema paranasal sinuses. Bilateral proptosis. No orbital mass. Other: None IMPRESSION: 1. Negative for acute infarct 2. Chronic hemorrhage in the left insula, left medial temporal lobe, and in the subarachnoid space. Etiology of the hemorrhage is not apparent. No mass lesion identified. Electronically Signed   By: Franchot Gallo M.D.   On: 05/22/2020 15:51   MR CERVICAL SPINE WO CONTRAST  Result Date: 05/22/2020 CLINICAL DATA:  Encephalopathy. History of pathologic fracture of T6 with epidural hematoma and cord compression. Recent thoracic spinal surgery. EXAM: MRI CERVICAL SPINE WITHOUT CONTRAST TECHNIQUE: Multiplanar, multisequence MR imaging of the cervical spine was performed. No intravenous contrast was administered. COMPARISON:  Thoracic MRI 04/25/2020 FINDINGS: Alignment: Mild retrolisthesis C3-4. Vertebrae: 10 mm lesion T1 vertebral body and pedicle on the left. This appears to represent tumor based on  the thoracic MRI appearance of multiple lesions in the bone marrow. No cervical spine bone lesion identified. Negative for fracture. Cord: Normal spinal cord signal. There is epidural hematoma beginning at C6-7 extending into the thoracic spine. This was present on the thoracic MRI as well. No cord compression. Posterior Fossa, vertebral arteries, paraspinal tissues: No cervical paraspinous mass. There is considerable edema in the posterior soft tissues of the thoracic spine related to recent surgery. Disc levels: C2-3: Mild disc and facet degeneration.  Negative for stenosis C3-4: Moderate disc degeneration and spurring. Mild facet degeneration. Cord flattening with mild spinal stenosis. Moderate foraminal stenosis bilaterally due to spurring. C4-5: Disc degeneration and diffuse uncinate spurring. Mild foraminal narrowing bilaterally C5-6: Disc degeneration and diffuse uncinate spurring right greater than left. Moderate right foraminal narrowing. No spinal stenosis C6-7: Mild to moderate central disc protrusion with mild spinal stenosis. Mild foraminal narrowing  bilaterally due to spurring C7-T1: Moderate right foraminal encroachment due to spurring. Mild left foraminal narrowing. IMPRESSION: 1. 10 mm lesion T1 vertebral body and pedicle on the left compatible with myeloma or metastatic disease. No cervical bone lesion. 2. Multilevel degenerative changes in the cervical spine causing spinal and foraminal stenosis as above 3. Posterior epidural hematoma extending to C6-7 from the thoracic spine. This is not causing compression of the visualized spinal cord. This was present on the thoracic MRI. There are postsurgical changes in the posterior soft tissues thoracic spine due to recent surgery. Electronically Signed   By: Franchot Gallo M.D.   On: 05/22/2020 16:02   MR THORACIC SPINE W WO CONTRAST  Addendum Date: 05/23/2020   ADDENDUM REPORT: 05/18/2020 22:19 ADDENDUM: Critical Value/emergent results were called by  telephone at the time of interpretation on 05/04/2020 at 2210 hours to Dr. Shirlyn Goltz , who verbally acknowledged these results. Furthermore, after reading the Lumbar MRI on this patient, it is possible this large amount of intraspinal hematoma is in the subdural space in addition to (or rather than) the epidural space. Electronically Signed   By: Genevie Ann M.D.   On: 05/19/2020 22:19   Result Date: 05/10/2020 CLINICAL DATA:  74 year old male status post fall with T6 vertebral fracture and abnormal appearance of the thoracic spinal canal suggesting cord compression on CT. Lytic left 2nd and 5th posterior ribs. EXAM: MRI THORACIC WITHOUT AND WITH CONTRAST TECHNIQUE: Multiplanar and multiecho pulse sequences of the thoracic spine were obtained without and with intravenous contrast. CONTRAST:  8.17m GADAVIST GADOBUTROL 1 MMOL/ML IV SOLN COMPARISON:  CT Chest, Abdomen, and Pelvis, thoracic and lumbar spine today reported separately. FINDINGS: Limited cervical spine imaging: Suspected abnormal epidural material tracking cephalad into the cervical spine from the thoracic spine, probably to at least the C6 level. Thoracic spine segmentation:  Normal. Alignment: Stable from the earlier CTs. No significant spondylolisthesis. Vertebrae: T6 vertebral body compression with 35% loss of vertebral body height and diffusely abnormal, decreased precontrast T1 marrow signal throughout the body, but also the right T6 pedicle and lamina. Associated intense enhancement and STIR hyperintensity. Mild retropulsion of bone or abnormal soft tissue. There are also decreased T1, STIR hyperintense, and enhancing lesions in the left T8 transverse process, T8 spinous process, right posterior T12 vertebral body, as well as the left 3rd, 5th, and 6th posterior ribs. No dispense Lea the bulky soft tissue destroying and extending out of the left 3rd rib (series 19, image 6). No other thoracic pathologic fracture identified. Cord: Diffusely abnormal  spinal canal where there appears to be a large volume of T1 and T2 hyperintense, partially GRE hypointense, hemorrhage (series 16, image 9) tracking from the level of the conus throughout the thoracic spine and into the lower cervical spine. There is widespread associated spinal cord mass effect which is mild at the cervicothoracic junction (series 18, image 1) but severe at the T4-T5 through T6-T7 levels (series 18, images 15 through 18). And dorsal to the abnormal T6 vertebra the epidural blood appears most heterogeneous and also demonstrates enhancement following contrast (series 22, image 8). See also series 18, image 18 and series 23, image 18. No superimposed dural thickening or enhancement. No abnormal spinal cord signal despite the compression. No abnormal intra-axial enhancement of the cord. Paraspinal and other soft tissues: There is also bulky abnormal prevertebral material in the mid and upper thoracic spine which is heterogeneous on T2 and GRE sequences, T1 hypointense and seems not to be  enhancing. Burtis Junes this is also hemorrhage. Superimposed pleural fluid or hemothorax. There is possibly a small area of soft tissue injury superficial to the T5 and T6 spinous processes. But otherwise the dorsal paraspinal soft tissues are within normal limits. Partially visible polycystic renal disease in the upper abdomen. Disc levels: No age advanced degenerative changes. IMPRESSION: 1. 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 2. Subsequent widespread spinal cord mass effect, with up to severe cord compression T4-T5 through T6-T7. No cord signal abnormality identified, although there could be active bleeding (enhancement) within the dorsal epidural hematoma at T5 and T6. 3. Similar bulky prevertebral hematoma also suspected in the  midthoracic spine. And small pleural effusions or pleural blood. 4. See also Lumbar MRI reported separately. Electronically Signed: By: Genevie Ann M.D. On: 05/04/2020 22:05   MR Lumbar Spine W Wo Contrast  Result Date: 05/19/2020 CLINICAL DATA:  74 year old male status post fall with T6 vertebral fracture and abnormal appearance of the thoracic spinal canal suggesting cord compression on CT. Lytic left 2nd and 5th posterior ribs. EXAM: MRI LUMBAR SPINE WITHOUT AND WITH CONTRAST TECHNIQUE: Multiplanar and multiecho pulse sequences of the lumbar spine were obtained without and with intravenous contrast. CONTRAST:  8.46m GADAVIST GADOBUTROL 1 MMOL/ML IV SOLN COMPARISON:  Thoracic spine MRI today reported separately along with CT Chest, Abdomen, and Pelvis today. FINDINGS: Segmentation: Normal, concordant with the thoracic spine numbering today. Alignment: Mild straightening of lumbar lordosis. Subtle retrolisthesis of L1 on L2. subtle anterolisthesis of L4 on L5. Vertebrae: Enhancing bone lesions in the right posterior T12 and inferior L4 vertebral bodies. Smaller enhancing lesions in the left L5 pedicle, left S3 vertebra. Compression and heterogeneous enhancement and edema throughout the L3 vertebral body. Central loss of height up to 25%. No retropulsed bone. However, there is an associated right transverse process fracture of L3 with marrow edema, no enhancement. But preserved normal marrow signal in the other L3 posterior elements. Visible SI joints appear intact. There is an expansile bone lesion of the right iliac wing on series 29, image 34. Conus medullaris and cauda equina: Only faint abnormal epidural or subdural space signal along the visible lower thoracic spinal cord and conus (series 23, image 9 and series 26, image 5. This does not continue below the T12-L1 level. Conus extends to the T12-L1 level. No lower spinal cord or conus signal abnormality. Cauda equina nerve roots appear fairly normal. No  abnormal intradural enhancement in the lumbar spine. Paraspinal and other soft tissues: Polycystic renal disease. Lumbar paraspinal soft tissues are within normal limits. Disc levels: Mostly mild for age lumbar spine degeneration. There is moderate to severe facet hypertrophy in the lower lumbar spine. No degenerative spinal stenosis. IMPRESSION: 1. 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 2. Probable pathologic fracture of the L3 vertebral body. 25% loss of height with no retropulsion. Associated fracture of the right L3 transverse process. 3. The abnormal epidural (versus subdural) Intraspinal Hemorrhage seen in the thoracic spine tapers at the level of the conus, T12-L1. No conus signal abnormality and normal cauda equina nerve roots. 4. Ordinary lower lumbar spine degeneration, but no degenerative spinal stenosis. Electronically Signed   By: HGenevie AnnM.D.   On: 04/23/2020 22:16   CT ABDOMEN PELVIS W CONTRAST  Result Date: 04/23/2020 CLINICAL  DATA:  Status post fall with subsequent back pain. EXAM: CT CHEST, ABDOMEN, AND PELVIS WITH CONTRAST TECHNIQUE: Multidetector CT imaging of the chest, abdomen and pelvis was performed following the standard protocol during bolus administration of intravenous contrast. CONTRAST:  171m OMNIPAQUE IOHEXOL 300 MG/ML  SOLN COMPARISON:  None. FINDINGS: CT CHEST FINDINGS Cardiovascular: There is moderate severity calcification of the aortic arch. There is moderate severity cardiomegaly. No pericardial effusion. Mediastinum/Nodes: No enlarged mediastinal, hilar, or axillary lymph nodes. Thyroid gland, trachea, and esophagus demonstrate no significant findings. Lungs/Pleura: A 3.7 cm x 1.5 cm low-attenuation mass is seen along the posterior aspect of the left apex. An adjacent expansile lytic area is seen within the posterior aspect of the second left rib (axial CT image 15, CT series number 3). Moderate severity  atelectasis and/or infiltrate is seen within the left lower lobe. There is no evidence of a pleural effusion or pneumothorax. Musculoskeletal: Multiple sternal wires are seen. A 4.3 cm x 2.2 cm intramuscular lipoma is seen along the anterior left chest wall. Degenerative changes are seen throughout the thoracic spine. CT ABDOMEN PELVIS FINDINGS Hepatobiliary: No focal liver abnormality is seen. No gallstones, gallbladder wall thickening, or biliary dilatation. Pancreas: Unremarkable. No pancreatic ductal dilatation or surrounding inflammatory changes. Spleen: Normal in size without focal abnormality. Adrenals/Urinary Tract: Adrenal glands are unremarkable. Innumerable cysts of various sizes are seen within both kidneys. There is no evidence of renal calculi or hydronephrosis. Bladder is unremarkable. Stomach/Bowel: Stomach is within normal limits. Appendix appears normal. No evidence of bowel wall thickening, distention, or inflammatory changes. Vascular/Lymphatic: There is moderate severity calcification and atherosclerosis throughout the abdominal aorta. No enlarged abdominal or pelvic lymph nodes. Reproductive: The prostate gland is mildly enlarged. Other: No abdominal wall hernia or abnormality. No abdominopelvic ascites. Musculoskeletal: Multilevel degenerative changes are seen throughout the lumbar spine. IMPRESSION: 1. 3.7 cm x 1.5 cm low-attenuation mass along the posterior aspect of the left apex with an adjacent expansile lytic area seen within the posterior aspect of the second left rib. 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 2. Moderate severity atelectasis and/or infiltrate within the left lower lobe. 3. Innumerable cysts of various sizes within both kidneys consistent with polycystic kidney disease. 4. 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 5. Mild enlargement of the prostate gland. 6. Aortic atherosclerosis.  Aortic Atherosclerosis (ICD10-I70.0). Electronically Signed   By: TVirgina NorfolkM.D.   On: 05/23/2020 18:33   DG Thoracic Spine 1 View  Result Date: 05/14/2020 CLINICAL DATA:  T6 laminectomy EXAM: OPERATIVE THORACIC SPINE 1 VIEW(S) FLUOROSCOPY TIME:  28 seconds COMPARISON:  MRI thoracic spine dated 05/23/2020 FINDINGS: Single intraoperative fluoroscopic image during T6 laminectomy, used for intraoperative localization. IMPRESSION: Single intraoperative fluoroscopic image during T6 laminectomy. Electronically Signed   By: SJulian HyM.D.   On: 05/14/2020 05:05   NM Hepato W/EF  Result Date: 05/27/2020 CLINICAL DATA:  Gallbladder wall thickening on recent ultrasound. EXAM: NUCLEAR MEDICINE HEPATOBILIARY IMAGING TECHNIQUE: Sequential images of the abdomen were obtained out to 60 minutes following intravenous administration of radiopharmaceutical. RADIOPHARMACEUTICALS:  5.6 mCi Tc-98mCholetec IV COMPARISON:  Ultrasound 05/26/2020 FINDINGS: Symmetric uptake in the liver and prompt excretion into the biliary tree. The gallbladder is visualized at 15 minutes. Activity was never seen in the small bowel. Because of the patient's condition we were not able to give the patient any oral agent that might prompt gallbladder emptying.  I do not see any intrahepatic biliary dilatation on the recent ultrasound and the common bile duct was normal in caliber. Findings are probably due to cholestasis and unlikely due to common bile duct obstruction. At some point MRI and MRCP may be helpful for further evaluation if the patients liver function studies remain abnormal. IMPRESSION: 1. Patent cystic duct. 2. No radiopharmaceutical is seen in the small bowel at 90 minutes. This is more likely due to cholestasis than common bile duct obstruction as above. Electronically Signed   By: Marijo Sanes M.D.   On: 05/27/2020 16:30   CT T-SPINE NO CHARGE  Result Date: 05/15/2020 CLINICAL DATA:  Status post fall. EXAM: CT  THORACIC SPINE WITHOUT CONTRAST TECHNIQUE: Multidetector CT images of the thoracic were obtained using the standard protocol without intravenous contrast. COMPARISON:  None. FINDINGS: Alignment: Normal. Vertebrae: A compression fracture deformity is seen involving the T6 vertebral body. This is of indeterminate age. An adjacent 2.3 cm x 2.2 cm lytic area is seen within this region. Mild paraspinal soft tissue swelling is noted. Paraspinal and other soft tissues: A 3.9 cm x 1.5 cm heterogeneous low-attenuation soft tissue mass is seen along the posterior aspect of the left apex. The adjacent portion of the posterior second left rib is expansile and lytic in appearance. Disc levels: Mild multilevel endplate sclerosis is seen with mild intervertebral disc space narrowing is seen. IMPRESSION: 1. T6 vertebral body compression fracture deformity with an associated 2.3 cm x 2.2 cm lytic area and paraspinal soft tissue swelling. Further evaluation with MRI is recommended. 2. 3.9 cm x 1.5 cm heterogeneous low-attenuation soft tissue mass along the posterior aspect of the left apex with an expansile and lytic appearing posterior second left rib. This may represent a primary bronchogenic carcinoma. Further evaluation with a nuclear medicine PET/CT scan is recommended. Electronically Signed   By: Virgina Norfolk M.D.   On: 04/29/2020 18:39   CT L-SPINE NO CHARGE  Result Date: 05/15/2020 CLINICAL DATA:  Status post fall. EXAM: CT LUMBAR SPINE WITHOUT CONTRAST TECHNIQUE: Multidetector CT imaging of the lumbar spine was performed without intravenous contrast administration. Multiplanar CT image reconstructions were also generated. COMPARISON:  None. FINDINGS: Segmentation: 5 lumbar type vertebrae. Alignment: Normal. Vertebrae: No acute fracture. A 1.6 cm x 0.9 cm well-defined lytic area is seen within the posterior aspect of the lower L4 vertebral body (sagittal reformatted image 150, CT series number 1). Paraspinal and  other soft tissues: Negative. Disc levels: Mild to moderate severity multilevel endplate sclerosis is seen with mild multilevel intervertebral disc space narrowing IMPRESSION: 1. No acute fracture within the lumbar spine. 2. Mild to moderate severity multilevel degenerative changes. 3. 1.6 cm x 0.9 cm well-defined lytic area within the posterior aspect of the lower L4 vertebral body. This may represent a small hemangioma. MRI correlation is recommended. Electronically Signed   By: Virgina Norfolk M.D.   On: 05/15/2020 18:41   DG Chest Port 1 View  Result Date: 05/29/2020 CLINICAL DATA:  Respiratory failure.  Follow-up exam. EXAM: PORTABLE CHEST 1 VIEW COMPARISON:  05/28/2020 FINDINGS: Hazy airspace opacity in the right mid lung is similar to the previous day's exam. No new lung abnormalities. Small, left greater than right, pleural effusions, on the left obscuring hemidiaphragm, stable. No pneumothorax. Tracheostomy tube, enteric tube and left internal jugular central venous line are stable. IMPRESSION: 1. No significant change from the previous day's study. 2. Airspace opacity in the inferior right upper lobe abutting the minor fissure, which  remains suspicious for pneumonia. Electronically Signed   By: Lajean Manes M.D.   On: 05/29/2020 09:50   DG CHEST PORT 1 VIEW  Result Date: 05/28/2020 CLINICAL DATA:  Tracheostomy tube change EXAM: PORTABLE CHEST 1 VIEW COMPARISON:  05/26/2020 FINDINGS: Left central line tip projects over the brachiocephalic vein near the confluence. A feeding tube extends down into the stomach and beyond the inferior margin of today's examination. A tube projecting over the tracheal air shadow appears satisfactorily positioned. This is less opaque than the prior tracheostomy tube, and may represent a temporary soft tube or simply a changed out tube. Mildly accentuated hazy opacity in the right upper lobe above the minor fissure, increased from prior exam from 2 days ago, raising  suspicion for possible developing pneumonia. Continued blunting of the left lateral costophrenic angle with indistinct opacity at the left lung base. Dense calcification along the pericardium along the left heart. Prior median sternotomy with numerous clips project over the mediastinum. IMPRESSION: 1. Mildly accentuated hazy opacity in the right upper lobe above the minor fissure, raising suspicion for possible developing pneumonia. 2. Stable blunting of the left lateral costophrenic angle. 3. Tubes and lines appear satisfactorily positioned. A new less radiopaque tracheostomy tube is present in the location of the prior tube. 4. Dense calcification along the pericardium along the left heart. Electronically Signed   By: Van Clines M.D.   On: 05/28/2020 15:38   DG Chest Port 1 View  Result Date: 05/26/2020 CLINICAL DATA:  74 year old male status post tracheostomy placement. EXAM: PORTABLE CHEST 1 VIEW COMPARISON:  Chest radiograph dated 05/24/2020. FINDINGS: Tracheostomy with tip approximately 5 cm above the carina. Other support apparatus in similar position. Left lung base and right upper lobe density similar or slightly progressed since the prior radiograph. Clinical correlation and continued follow-up recommended. No change in the cardiomediastinal silhouette. IMPRESSION: Tracheostomy above the carina. Electronically Signed   By: Anner Crete M.D.   On: 05/26/2020 15:34   DG CHEST PORT 1 VIEW  Result Date: 05/24/2020 CLINICAL DATA:  Acute respiratory failure with hypoxemia EXAM: PORTABLE CHEST 1 VIEW COMPARISON:  Two days ago FINDINGS: The endotracheal tube tip at least reaches the clavicular heads. The feeding tube reaches the stomach. Large lung volumes with diaphragm flattening. Interstitial coarsening, most notable at the left base where there is scarring by recent CT. Curvilinear pericardial based calcification along the left heart border. Extensive spinal fusion. IMPRESSION: Stable  hardware positioning and aeration. Electronically Signed   By: Monte Fantasia M.D.   On: 05/24/2020 05:07   DG Chest Port 1 View  Result Date: 05/21/2020 CLINICAL DATA:  Acute respiratory failure EXAM: PORTABLE CHEST 1 VIEW COMPARISON:  05/20/2020 FINDINGS: Endotracheal tube, partially imaged enteric tube, and left IJ central line identified. Persistent but improved bilateral patchy opacities. Decreased pleural effusions. No pneumothorax. Stable cardiomediastinal contours. IMPRESSION: Persistent but improved patchy bilateral opacities. Decreased pleural effusions. Electronically Signed   By: Macy Mis M.D.   On: 05/21/2020 08:34   Portable Chest x-ray  Result Date: 05/20/2020 CLINICAL DATA:  Endotracheal tube present. EXAM: PORTABLE CHEST 1 VIEW COMPARISON:  Radiograph 05/18/2020 FINDINGS: Endotracheal tube tip at the thoracic inlet. Left internal jugular central venous catheter in place, tip obscured by overlying spinal hardware. Enteric tube is no longer visualized. Prior median sternotomy. Stable cardiomegaly. Unchanged mediastinal contours. Aortic atherosclerosis. Minimal improvement in the patchy right perihilar opacity, worsening opacities in the left mid lower lung zones. Small pleural effusions. No pneumothorax. IMPRESSION: 1.  Bilateral airspace opacities. This is worsened on the left and slightly improved on the right from prior exam. Favor pneumonia over pulmonary edema. 2. Stable cardiomegaly.  Small pleural effusions. 3. Enteric tube is no longer seen. Endotracheal tube and left central line remain in place. Electronically Signed   By: Keith Rake M.D.   On: 05/20/2020 01:07   DG CHEST PORT 1 VIEW  Result Date: 05/18/2020 CLINICAL DATA:  ETT placement EXAM: PORTABLE CHEST 1 VIEW COMPARISON:  Radiograph 04/14/2020 FINDINGS: Endotracheal tube in the mid trachea approximately 4.5 cm from the carina. Transesophageal tube tip terminates below the level of imaging, beyond the GE  junction. Left IJ catheter tip terminates in the left brachiocephalic vein just proximal to the left brachiocephalic-caval confluence. Telemetry leads overlie the chest. Increasing opacity is seen in the right upper lobe abutting the minor fissure. Suspect some additional opacity in the mid to lower lung as well. There are persistent bandlike opacities in the left base favoring atelectasis. Small right effusion. Left costophrenic sulcus is partially collimated. Postsurgical changes related to prior CABG including numerous mediastinal surgical clips and sternotomy sutures, the lowest of which appears fractured, similar to prior. There is stable cardiomegaly with increased attenuation of the lower mediastinum, possibly reflecting a hiatal hernia as well. A calcified tortuous aorta is present. Prior thoracic fusion. No acute osseous or soft tissue abnormality. Degenerative changes are present in the imaged spine and shoulders. IMPRESSION: 1. Lines and tubes as above. 2. Increasing opacity in the right upper lobe abutting the minor fissure. Suspect some additional opacity in the mid to lower lung as well. Could reflect developing consolidation/infection. 3. Stable bandlike opacities in the left base favoring atelectasis. 4. Small right effusion. 5. Stable cardiomegaly. Electronically Signed   By: Lovena Le M.D.   On: 05/18/2020 05:28   DG CHEST PORT 1 VIEW  Result Date: 05/14/2020 CLINICAL DATA:  Intubated EXAM: PORTABLE CHEST 1 VIEW COMPARISON:  05/20/2020 at 1517 hours FINDINGS: Endotracheal tube terminates 5.5 cm above the carina. Enteric tube terminates at the level of the diaphragm/GE junction. Left IJ venous catheter terminates in the distal left brachiocephalic vein/SVC. Mild left basilar atelectasis. Lungs are otherwise clear. No pleural effusion or pneumothorax. Cardiomegaly.  Postsurgical changes related to prior CABG. Median sternotomy.  Skin staples overlying the midline chest. IMPRESSION:  Endotracheal tube terminates 5.5 cm above the carina. Enteric tube terminates at the level of the diaphragm/GE junction. Left IJ venous catheter terminates in the distal left brachiocephalic vein/SVC. No evidence of acute cardiopulmonary disease. Electronically Signed   By: Julian Hy M.D.   On: 05/14/2020 05:04   DG Chest Port 1 View  Result Date: 05/15/2020 CLINICAL DATA:  [place] while walking to the bathroom with his walker, lost balance and fell into seated position, BILATERAL flank pain EXAM: PORTABLE CHEST 1 VIEW COMPARISON:  Portable exam 1517 hours compared to 01/12/2020 FINDINGS: Enlargement of cardiac silhouette post median sternotomy. Atherosclerotic calcification aorta. Mediastinal contours and pulmonary vascularity normal. LEFT basilar atelectasis without pulmonary infiltrate, pleural effusion, or pneumothorax. Bones demineralized. IMPRESSION: Enlargement of cardiac silhouette with LEFT basilar atelectasis. Electronically Signed   By: Lavonia Dana M.D.   On: 05/02/2020 15:24   DG Knee Complete 4 Views Left  Result Date: 05/15/2020 CLINICAL DATA:  Status post trauma. EXAM: LEFT KNEE - COMPLETE 4+ VIEW COMPARISON:  None. FINDINGS: No evidence of fracture, dislocation, or joint effusion. Moderate severity narrowing of the medial and lateral tibiofemoral compartment spaces is seen. There is  marked severity vascular calcification. IMPRESSION: Moderate severity degenerative changes without evidence of acute osseous abnormality. Electronically Signed   By: Virgina Norfolk M.D.   On: 04/25/2020 16:11   DG Abd Portable 1V  Result Date: 05/18/2020 CLINICAL DATA:  Enteric catheter placement EXAM: PORTABLE ABDOMEN - 1 VIEW COMPARISON:  05/20/2020 FINDINGS: Frontal view of the lower chest and upper abdomen demonstrates enteric catheter coiled back upon itself overlying the region of the distal thoracic esophagus. Bowel gas pattern is unremarkable. Small bilateral pleural effusions are noted.  IMPRESSION: 1. Enteric catheter coiled back upon itself overlying the distal thoracic esophagus. Recommend removal and replacement. These results will be called to the ordering clinician or representative by the Radiologist Assistant, and communication documented in the PACS or [employer]. Electronically Signed   By: Randa Ngo M.D.   On: 05/18/2020 19:35   DG Abd Portable 1V  Result Date: 04/26/2020 CLINICAL DATA:  Enteric catheter adjustment EXAM: PORTABLE ABDOMEN - 1 VIEW COMPARISON:  04/23/2020 FINDINGS: Frontal view of the lower chest and upper abdomen demonstrates tip and side port of the enteric catheter projecting over the gastric body. Bowel gas pattern is unremarkable. IMPRESSION: 1. Enteric catheter tip and side port projecting over gastric body. Electronically Signed   By: Randa Ngo M.D.   On: 05/04/2020 22:21   DG Abd Portable 1V  Result Date: 04/28/2020 CLINICAL DATA:  OG tube placement EXAM: PORTABLE ABDOMEN - 1 VIEW COMPARISON:  03/13/2020 FINDINGS: Pleural effusion thickening at the bases. Calcification at the cardiac base. Esophageal tube appears folded back upon itself with the tip directed cranially over the GE junction region. IMPRESSION: Esophageal tube appears folded back upon itself with the tip directed cranially in the region of the GE junction. Repositioning is recommended. These results will be called to the ordering clinician or representative by the Radiologist Assistant, and communication documented in the PACS or [employer]. Electronically Signed   By: Donavan Foil M.D.   On: 05/20/2020 21:59   EEG adult  Result Date: 05/18/2020 Lora Havens, MD     05/18/2020 12:03 PM Patient Name: [facility] MRN: [id number] Epilepsy Attending: Lora Havens Referring Physician/Provider: Dr. Dyann Ruddle Date: 05/18/2020 Duration: 23.42 minutes Patient history: 74 year old male with altered mental status.  EEG evaluate for seizures. Level of alertness:  Comatose AEDs during EEG study: Keppra, Dilantin, valproic acid, diazepam Technical aspects: This EEG study was done with scalp electrodes positioned according to the 10-20 International system of electrode placement. Electrical activity was acquired at a sampling rate of 500Hz  and reviewed with a high frequency filter of 70Hz  and a low frequency filter of 1Hz . EEG data were recorded continuously and digitally stored. Description: EEG showed continuous generalized polymorphic low amplitude mixed frequencies including predominantly 5 to 6 Hz theta activity as well as 2 to 3 Hz delta activity.  Sharp waves were seen in right anterior temporal region, maximal F8-T8.  Hyperventilation and photic stimulation were not performed.   ABNORMALITY -Sharp waves, right anterior temporal region, maximal F8-T8 -Continuous slow, generalized IMPRESSION: This study showed evidence of epileptogenicity arising from the right anterior temporal region.  Additionally, there is evidence of severe diffuse encephalopathy, nonspecific etiology.  No seizures were seen during the study. Priyanka [name]   Overnight EEG with video  Result Date: 05/21/2020 Lora Havens, MD     05/22/2020  8:46 AM Patient Name: [facility] MRN: [id number] Epilepsy Attending: Lora Havens Referring Physician/Provider: Dr Karena Addison Aroor Duration:  05/20/2020 1551 to 05/21/2020 1754 Patient history: 74 y.o. 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 On 5/27 patient became more somnolent and had agonal breathing requiring intubation. EEG to evaluate for seizure. Level of alertness: Comatose AEDs during EEG study: Keppra, PHT, VPA Technical aspects: This EEG study was done with scalp  electrodes positioned according to the 10-20 International system of electrode placement. Electrical activity was acquired at a sampling rate of 500Hz  and reviewed with a high frequency filter of 70Hz  and a low frequency filter of 1Hz . EEG data were recorded continuously and digitally stored. Description: EEG showed continuous generalized polymorphic 3-5Hz  theta-delta slowing. Sharp waves were seen in right anterior temporal region, maximal F8-T8. Hyperventilation and photic stimulation were not performed.   Of note, parts of study were difficult to interpret due to significant electrode artifact.  ABNORMALITY -Sharp waves, right anterior temporal region, maximal F8-T8 -Continuous slow, generalized  IMPRESSION: This study showed evidence of epileptogenicity arising from the right anterior temporal region.  Additionally, there is evidence of severe diffuse encephalopathy, nonspecific etiology.  No seizures were seen during the study. Lora Havens   DG C-Arm 1-60 Min  Result Date: 05/14/2020 CLINICAL DATA:  T6 laminectomy EXAM: OPERATIVE THORACIC SPINE 1 VIEW(S) FLUOROSCOPY TIME:  28 seconds COMPARISON:  MRI thoracic spine dated 05/23/2020 FINDINGS: Single intraoperative fluoroscopic image during T6 laminectomy, used for intraoperative localization. IMPRESSION: Single intraoperative fluoroscopic image during T6 laminectomy. Electronically Signed   By: Julian Hy M.D.   On: 05/14/2020 05:05   DG C-Arm 1-60 Min-No Report  Result Date: 05/15/2020 Fluoroscopy was utilized by the requesting physician.  No radiographic interpretation.   [place] EKG SITE RITE  Result Date: 05/30/2020 If Site Rite image not attached, placement could not be confirmed due to current cardiac rhythm.  US Abdomen Limited RUQ  Result Date: 05/26/2020 CLINICAL DATA:  Jaundice EXAM: ULTRASOUND ABDOMEN LIMITED RIGHT UPPER QUADRANT COMPARISON:  CT 05/11/2020 FINDINGS: Gallbladder: Diffusely thickened appearance of the gallbladder  wall measuring up to 6.6 mm. There is layering nonshadowing sludge within the gallbladder lumen. Percell Miller sign was unable to be assessed secondary to patient's condition. Common bile duct: Diameter: 4 mm.  The distal CBD was obscured by shadowing bowel gas. Liver: No focal lesion identified. Within normal limits in parenchymal echogenicity. Portal vein is patent on color Doppler imaging with normal direction of blood flow towards the liver. Other: There is dilation of the IVC and hepatic veins, which may be related to fluid status. Partially visualized right pleural effusion. Numerous right renal cysts. IMPRESSION: 1. Biliary sludge and mild diffuse gallbladder wall thickening. Findings are nonspecific and could be related to cholecystitis. Other causes of diffuse gallbladder wall thickening include CHF, hypoalbuminemia, hepatitis, amongst other etiologies. If high clinical suspicion for cholecystitis persists, a nuclear medicine hepatobiliary scan could be performed to assess the patency of the cystic duct and common bile duct. 2. Right pleural effusion. Electronically Signed   By: Davina Poke D.O.   On: 05/26/2020 16:41     ASSESSMENT & PLAN:  [facility]. is a 74 y.o. 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 No issues with spontaneous bleeding at baseline.  Now admitted  following a fall and status post thoracic laminectomy for hematoma evacuation followed by fusion.  He did well with his surgery without any evidence of significant bleeding postoperatively.  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 Now unable to wean from vent. S/P tracheostomy. Remains on pressors and propofol.  The patient is not currently medically stable to undergo completion of work-up for myeloma.  1) unspecified bleeding disorder -No significant  bleeding postoperatively -Hemoglobin stable overall, but slowly drifting -Recommend daily CBC -Transfuse for hemoglobin less than 7 or active bleeding  2) Iron deficiency -- received IV Iron in the past . Lab Results  Component Value Date   IRON 196 (H) 12/29/2019   TIBC 209 12/29/2019   IRONPCTSAT 94 (H) 12/29/2019   (Iron and TIBC)  Lab Results  Component Value Date   FERRITIN 135 05/17/2020  -no indication for additional IV Iron at this time.   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 -We will need to see how he recovers to determine next steps in terms of treatment. -Currently not a candidate for any aggressive myeloma treatment.  Could consider him for [place] weekly/low-dose -We could also consider bone directed therapies 2 to 3 weeks post spinal surgery to avoid healing delays. -We can also consider him for radiation depending on needs for symptom control.  Mikey Bussing, DNP, AGPCNP-BC, AOCNP Mon/Tues/Thurs/Fri 7am-5pm; Off Wednesdays Cell: [phone]

## 2020-05-30 NOTE — Progress Notes (Signed)
Levophed from 6 mcg/min to 40 mcg/min since 0800.  Sedation dose lowered throughout the day.  Dr. Elsworth Soho notified and will order another pressor.

## 2020-05-30 NOTE — Progress Notes (Signed)
RT attemped aline x3 without success. Rn aware and MD notified via message.

## 2020-05-30 NOTE — Progress Notes (Signed)
Patient ID: Louis Dandy., male   DOB: [birth year], 74 y.o.   MRN: [id number] BP (!) 86/75   Pulse (!) 29   Temp 100.1 F (37.8 C) (Axillary)   Resp (!) 27   Ht 6\' 2"  (1.88 m) Comment: Measured by RT  Wt 103.6 kg   SpO2 92%   BMI 29.32 kg/m  Non responsive, not following commands Remains on trach and ventilator No improvement

## 2020-05-30 NOTE — Progress Notes (Signed)
SLP Cancellation Note  Patient Details Name: [facility]. MRN: [id number] DOB: 12/04/1946   Cancelled treatment:       Reason Eval/Treat Not Completed: Patient not medically ready. On vent, and per chart, not responding to commands or painful stimuli.     Osie Bond., M.A. [facility] Pager [phone] Office [phone]  05/30/2020, 8:16 AM

## 2020-05-31 ENCOUNTER — Ambulatory Visit: Payer: Medicare Other | Admitting: Internal Medicine

## 2020-05-31 ENCOUNTER — Inpatient Hospital Stay (HOSPITAL_COMMUNITY): Payer: Medicare Other

## 2020-05-31 LAB — POCT I-STAT 7, (LYTES, BLD GAS, ICA,H+H)
Acid-base deficit: 15 mmol/L — ABNORMAL HIGH (ref 0.0–2.0)
Acid-base deficit: 14 mmol/L — ABNORMAL HIGH (ref 0.0–2.0)
Bicarbonate: 11.6 mmol/L — ABNORMAL LOW (ref 20.0–28.0)
Bicarbonate: 11.8 mmol/L — ABNORMAL LOW (ref 20.0–28.0)
Calcium, Ion: 1.13 mmol/L — ABNORMAL LOW (ref 1.15–1.40)
Calcium, Ion: 1.07 mmol/L — ABNORMAL LOW (ref 1.15–1.40)
HCT: 28 % — ABNORMAL LOW (ref 39.0–52.0)
HCT: 27 % — ABNORMAL LOW (ref 39.0–52.0)
Hemoglobin: 9.5 g/dL — ABNORMAL LOW (ref 13.0–17.0)
Hemoglobin: 9.2 g/dL — ABNORMAL LOW (ref 13.0–17.0)
O2 Saturation: 98 %
O2 Saturation: 99 %
Patient temperature: 98.7
Patient temperature: 98
Potassium: 6.2 mmol/L — ABNORMAL HIGH (ref 3.5–5.1)
Potassium: 6.1 mmol/L — ABNORMAL HIGH (ref 3.5–5.1)
Sodium: 151 mmol/L — ABNORMAL HIGH (ref 135–145)
Sodium: 151 mmol/L — ABNORMAL HIGH (ref 135–145)
TCO2: 12 mmol/L — ABNORMAL LOW (ref 22–32)
TCO2: 13 mmol/L — ABNORMAL LOW (ref 22–32)
pCO2 arterial: 30.4 mmHg — ABNORMAL LOW (ref 32.0–48.0)
pCO2 arterial: 27.6 mmHg — ABNORMAL LOW (ref 32.0–48.0)
pH, Arterial: 7.188 — CL (ref 7.350–7.450)
pH, Arterial: 7.236 — ABNORMAL LOW (ref 7.350–7.450)
pO2, Arterial: 137 mmHg — ABNORMAL HIGH (ref 83.0–108.0)
pO2, Arterial: 166 mmHg — ABNORMAL HIGH (ref 83.0–108.0)

## 2020-05-31 LAB — BASIC METABOLIC PANEL
Anion gap: 15 (ref 5–15)
BUN: 75 mg/dL — ABNORMAL HIGH (ref 8–23)
CO2: 12 mmol/L — ABNORMAL LOW (ref 22–32)
Calcium: 7.6 mg/dL — ABNORMAL LOW (ref 8.9–10.3)
Chloride: 117 mmol/L — ABNORMAL HIGH (ref 98–111)
Creatinine, Ser: 2.42 mg/dL — ABNORMAL HIGH (ref 0.61–1.24)
GFR calc Af Amer: 30 mL/min — ABNORMAL LOW (ref >60)
GFR calc non Af Amer: 26 mL/min — ABNORMAL LOW (ref >60)
Glucose, Bld: 114 mg/dL — ABNORMAL HIGH (ref 70–99)
Potassium: 6 mmol/L — ABNORMAL HIGH (ref 3.5–5.1)
Sodium: 144 mmol/L (ref 135–145)

## 2020-05-31 LAB — GLUCOSE, CAPILLARY
Glucose-Capillary: 111 mg/dL — ABNORMAL HIGH (ref 70–99)
Glucose-Capillary: 153 mg/dL — ABNORMAL HIGH (ref 70–99)
Glucose-Capillary: 158 mg/dL — ABNORMAL HIGH (ref 70–99)
Glucose-Capillary: 170 mg/dL — ABNORMAL HIGH (ref 70–99)
Glucose-Capillary: 173 mg/dL — ABNORMAL HIGH (ref 70–99)
Glucose-Capillary: 79 mg/dL (ref 70–99)
Glucose-Capillary: 88 mg/dL (ref 70–99)

## 2020-05-31 LAB — CBC WITH DIFFERENTIAL/PLATELET
Abs Immature Granulocytes: 1.09 10*3/uL — ABNORMAL HIGH (ref 0.00–0.07)
Basophils Absolute: 0.1 10*3/uL (ref 0.0–0.1)
Basophils Relative: 0 %
Eosinophils Absolute: 0 10*3/uL (ref 0.0–0.5)
Eosinophils Relative: 0 %
HCT: 25.3 % — ABNORMAL LOW (ref 39.0–52.0)
Hemoglobin: 7.3 g/dL — ABNORMAL LOW (ref 13.0–17.0)
Immature Granulocytes: 5 %
Lymphocytes Relative: 9 %
Lymphs Abs: 2.2 10*3/uL (ref 0.7–4.0)
MCH: 29.7 pg (ref 26.0–34.0)
MCHC: 28.9 g/dL — ABNORMAL LOW (ref 30.0–36.0)
MCV: 102.8 fL — ABNORMAL HIGH (ref 80.0–100.0)
Monocytes Absolute: 2.1 10*3/uL — ABNORMAL HIGH (ref 0.1–1.0)
Monocytes Relative: 9 %
Neutro Abs: 18.3 10*3/uL — ABNORMAL HIGH (ref 1.7–7.7)
Neutrophils Relative %: 77 %
Platelets: 250 10*3/uL (ref 150–400)
RBC: 2.46 MIL/uL — ABNORMAL LOW (ref 4.22–5.81)
RDW: 24.2 % — ABNORMAL HIGH (ref 11.5–15.5)
WBC: 23.8 10*3/uL — ABNORMAL HIGH (ref 4.0–10.5)
nRBC: 13.6 % — ABNORMAL HIGH (ref 0.0–0.2)

## 2020-05-31 MED ORDER — SODIUM CHLORIDE 0.9 % IV SOLN
1.0000 g | Freq: Two times a day (BID) | INTRAVENOUS | Status: DC
  Administered 2020-05-31 – 2020-06-02 (×5): 1 g via INTRAVENOUS
  Filled 2020-05-31 (×6): qty 1

## 2020-05-31 MED ORDER — INSULIN ASPART 100 UNIT/ML IV SOLN
10.0000 [IU] | Freq: Once | INTRAVENOUS | Status: AC
  Administered 2020-05-31: 10 [IU] via INTRAVENOUS

## 2020-05-31 MED ORDER — CALCIUM GLUCONATE-NACL 2-0.675 GM/100ML-% IV SOLN
2.0000 g | Freq: Once | INTRAVENOUS | Status: AC
  Administered 2020-05-31: 2000 mg via INTRAVENOUS
  Filled 2020-05-31: qty 100

## 2020-05-31 MED ORDER — SODIUM BICARBONATE 8.4 % IV SOLN
100.0000 meq | Freq: Once | INTRAVENOUS | Status: AC
  Administered 2020-05-31: 100 meq via INTRAVENOUS
  Filled 2020-05-31: qty 100

## 2020-05-31 MED ORDER — SODIUM BICARBONATE 8.4 % IV SOLN
100.0000 meq | Freq: Once | INTRAVENOUS | Status: AC
  Administered 2020-05-31: 100 meq via INTRAVENOUS
  Filled 2020-05-31: qty 50

## 2020-05-31 MED ORDER — DEXTROSE 50 % IV SOLN
INTRAVENOUS | Status: AC
  Filled 2020-05-31: qty 50

## 2020-05-31 MED ORDER — SODIUM BICARBONATE 8.4 % IV SOLN
INTRAVENOUS | Status: AC
  Filled 2020-05-31: qty 50

## 2020-05-31 MED ORDER — FREE WATER
200.0000 mL | Status: DC
  Administered 2020-05-31 – 2020-06-02 (×12): 200 mL

## 2020-05-31 MED ORDER — SODIUM ZIRCONIUM CYCLOSILICATE 10 G PO PACK
10.0000 g | PACK | Freq: Once | ORAL | Status: AC
  Administered 2020-05-31: 10 g
  Filled 2020-05-31: qty 1

## 2020-05-31 MED ORDER — STERILE WATER FOR INJECTION IV SOLN
INTRAVENOUS | Status: DC
  Filled 2020-05-31 (×2): qty 850

## 2020-05-31 MED ORDER — DEXTROSE 50 % IV SOLN
1.0000 | Freq: Once | INTRAVENOUS | Status: AC
  Administered 2020-05-31: 50 mL via INTRAVENOUS
  Filled 2020-05-31: qty 50

## 2020-05-31 NOTE — Progress Notes (Signed)
NAME:  Louis Ford., MRN:  [id number], DOB:  1946/02/20, LOS: 57 ADMISSION DATE:  05/18/2020, CONSULTATIO care unit N DATE:  05/31/20 REFERRING MD:  Neurosurgery, CHIEF COMPLAINT:   Fall  Brief History   74 year old male who presented to the emergency department 5/21 after a fall while walking to the bathroom was found to have pathologic compression fracture of T6 and large volume spinal epidural hemorrhage requiring hematoma evacuation and thoracic laminectomy.  He was intubated for procedure and transferred to the intensive care unit postop. This allowed to be myeloma.  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   Significant Hospital Events   5/21 admit to neurosurgery 5/22 to ICU postop 5/24 - OR for T4-T8 arthrodesis. T6 found to be infiltrated with tumor 6/3 Tracheostomy placed 6/5 tracheostomy change to 6 XLT 6/8: deteriorated overnight on 2 pressors, changed to DNR  Consults:  PCCM Hematology  Procedures:  5/22 thoracic laminectomy 6/3-tracheostomy 6/5-tracheostomy change to 6 XLT  Significant Diagnostic Tests:  05/22/2020 MR Brain wo Contrast: Negative for acute infarct Chronic hemorrhage in the left  insula, left medial temporal lobe, and in the subarachnoid space. Etiology of the hemorrhage is not apparent. No mass lesion identified.  5/30 MR Spine 10 mm lesion T1 vertebral body and pedicle on the left compatible with myeloma or metastatic disease. No cervical bone lesion. Multilevel degenerative changes in the cervical spine causing spinal and foraminal stenosis as above Posterior epidural hematoma extending to C6-7 from the thoracic spine. This is not causing compression of the visualized spinal cord. This was present on the thoracic MRI. There are postsurgical changes in the posterior soft tissues thoracic spine due to recent surgery.  5/21 CT chest with contrast>>3.7 cm x 1.5 cm low-attenuation mass along the posterior aspect of the left apex with an adjacent expansile lytic area seen within the posterior aspect of the second left rib 5/21 MRI T-spine>>Constellation of multifocal bone Metastases versus Plasmacytoma,a pathologic compression fracture of T6, and a large volume of Spinal Epidural Hemorrhage spanning from the cervical spine throughout the thoracic spine to the conus.  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 5/28 CTH IMPRESSION: 1. Stable remote infarct of the left posterior insular cortex and left superior medial temporal lobe. 2. Stable atrophy and white matter disease. 3. No acute intracranial abnormality or significant interval change. 6/1 RUQ u/S: 1. Biliary sludge and mild diffuse gallbladder wall thickening. Findings are nonspecific and could be related to cholecystitis. Other causes of diffuse gallbladder wall thickening include CHF, hypoalbuminemia, hepatitis, amongst other etiologies. If high clinical suspicion for cholecystitis persists, a nuclear medicine hepatobiliary scan could be performed to assess the patency of the cystic duct  and common bile duct. 2. Right pleural effusion.  6/4 hida: 1. Patent cystic duct. 2. No radiopharmaceutical is seen in the small bowel at 90 minutes. This is more likely due to  cholestasis than common bile duct obstruction as above. Micro Data:  5/21 MRSA screen>> negative 5/21 SARS-Cov-2>> negative 5/21 urine culture>> serratia marcescens  5/28 endotracheal culture >> pseudomonas aeruginosa  Antimicrobials:  Ceftriaxone 5/21 Zosyn 5/27- 6/3, 6/7 >>  Interim history/subjective:  6/8: tmax 100.1, wbc increased to 23K, K up and bicarb down started on bicarb infusion overnight. Will recheck BMP at 1200. Just given temporizing measures and lokelma. Also added vasopressin and escalated dosing of levo.  Abdomen distended and new tongue twitching. Spoke with wife at length who has agreed to DNR. Maintain current level of care for now otherwise. Discussed with her we will add no further pressors as already on norepi and vaso and tachycardic, known hf 6/7: On low dose Levophed Critically ill, low-grade febrile  Objective   Blood pressure 124/84, pulse (!) 124, temperature 98.5 F (36.9 C), temperature source Axillary, resp. rate (!) 41, height _0  (1.88 m), weight 108.5 kg, SpO2 95 %. CVP:  [18 mmHg-22 mmHg] 22 mmHg  Vent Mode: PRVC FiO2 (%):  [40 %] 40 % Set Rate:  [18 bmp-24 bmp] 24 bmp Vt Set:  [600 mL-650 mL] 600 mL PEEP:  [5 cmH20] 5 cmH20 Plateau Pressure:  [18 [id number] cmH20] 18 cmH20   Intake/Output Summary (Last 24 hours) at 05/31/2020 0749 Last data filed at 05/31/2020 0600 Gross per 24 hour  Intake 2997.67 ml  Output 555 ml  Net 2442.67 ml   Filed Weights   05/29/20 0409 05/30/20 0404 05/31/20 0411  Weight: 103.6 kg 103.6 kg 108.5 kg    GEN:  Unresponsive, critically ill, ventilated via trach HEENT: Dry oral mucosa, pupils sluggish, no JVD, no icterus, tongue twitching CV: tachycardic, irregular rhythm, 1+ pulses PULM:  Bilateral rhonchi, fair air entry GI: BS+ high  pitched, distended and firm EXT: anasarca NEURO: no withdrawal to pain, no movement in response to sound, cough with suctioning PSYCH: RASS -3 SKIN: no rashes, lesions or breakdown   Chest x-ray 6/6 personally reviewed which shows no significant change, fissural opacity persists.  Labs show  improved hypernatremia,worsening leukocytosis, anemia and renal failure with Cr up to 2.4 and K to 6.   Resolved Hospital Problem list     Assessment & Plan:   Acute  respiratory failure requiring mechanical ventilation s/p tracheostomy Plan No significant tracheostomy leak noted  Hold ps/atc trials with hemodynamic instability   Septic shock HFrEF, CAD s/p CABG,  atrial fibrillation Last echocardiogram in 03/2020 with EF of 30 to 35%, not chronically anti-coagulated given bleeding disorder Amiodarone Prn Metoprolol for additional rate control Titrate pressors to map >65 -on vaso and levo, max rate at this time  Pseudomonas HAP Persistent but improved patchy bilateral opacities.  -CXR 6/6 with  right upper lobe density similar to previous  Chest x-ray reviewed by myself.  -RUQ [place] 6/3 with R pleural effusion  -broadened to merrem at this time  Leukocytosis:  -with some lgt -will check blood cx at this time.  -close monitoring as well.  -fungitell pending -broaden to merrem  Acute  metabolic encephalopathy which is stable.  No seizures on EEG MRI Brain negative for acute infarct, some chronic hemorrhage  Increasing agitation 6/5, Seroquel added  -Continue propofol , dropped BP with Precedex -Enteral oxycodone standing  Hyperammonemia-6/4 77 Decrease lactulose to 30 twice daily Continue Xifaxan  Hypernatremia , improving gradually -Continue free water ( change to 200 mL q 4)    Elevated LFTs: currently down trending, RUQ U/S with biliary sludge and  and mild diffuse gallbladder wall thickening HIDA scan-patent cystic duct ? Related to seizure medication Continue to  trend Abdominal distention:  -kub with large amount of gastric distention.  -will have cortrak team advance feeding tube, monitor closely   Fall with pathologic T6 fracture and epidural hematoma causing cord compression.  Status post T6 laminectomy and spine stabilization 5/25. Myeloma related with IgG Kappa spike Oncology following with Plan for bone scan and bone biopsy if patient stablizes Family has graciously opted for dnr with maintaining current level of care at this time.    Bleeding diathesis, felt to be related to paraprotein Worked up by Dr. Irene Limbo and at [facility] by [name] Work-up for possible multiple myeloma per hematology pathology pending TXA and FFP before procedures  Consider tag to better define clotting disorder. Transfuse for HGB < 7  History of seizure disorder Sound like both tonic-clonic, followed by neurology Continue Keppra and Dilantin per Neuro -new tongue twitching previously not associated with sz.   Hypoglycemia -Discontinue insulin and follow CBGs  Overall prognosis is guarded to poor. Family has graciously opted for DNR with his recent decline. They are clear they would not want dialysis for the pt. We will otherwise maintain current level of care and update plan and goals as necessary  Daily Goals Checklist  Pain/Anxiety/Delirium protocol (if indicated): Propofol, enteral oxycodone  DVT prophylaxis: SCD's only due to bleeding diathesis.  Nutrition Status: Cortrak, TFs GI prophylaxis: pantoprazole Glucose control: T2DM with SSI, Levemir  Mobility/therapy needs: Bedrest Code Status: Full  Family Communication: 6/8 wife via phone Disposition: ICU  Critical care time: The patient is critically ill with multiple organ systems failure and requires high complexity decision making for assessment and support, frequent evaluation and titration of therapies, application of advanced monitoring technologies and extensive interpretation of multiple databases.   Critical care time 46 mins. This represents my time independent of the NPs time taking care of the pt. This is excluding procedures.    [facility] 05/31/2020, 7:49 AM

## 2020-05-31 NOTE — Progress Notes (Signed)
eLink Physician-Brief Progress Note Patient Name: Louis Ford. DOB: 02-27-1946 MRN: [id number]   Date of Service  05/31/2020  HPI/Events of Note  K+ = 6.0. QRS looks wide. New vs old?  eICU Interventions  Plan: 1. D50 1 amp IV now.  2. Novolog insuilin 10 units IV now.  3. Calcium gluconate 2 gm IV over 1 hour now. 4. Lokelma 10 gm per tube now. 5. Repeat BMP at 12 noon.      Intervention Category Major Interventions: Other:  Adalynn Corne Cornelia Copa 05/31/2020, 7:02 AM

## 2020-05-31 NOTE — Progress Notes (Signed)
eLink Physician-Brief Progress Note Patient Name: [facility]. DOB: 1946-02-03 MRN: [id number]   Date of Service  05/31/2020  HPI/Events of Note  ABG on 80%/PRVC 24/TV 600/P5 = 7.188/30.4/137  eICU Interventions  Plan: 1. Increase PRVC rate to 29. 2. NaHCO3 100 meq IV now.  3. NaHCO3 IV infusion to run IV at 50 mL/hour. 4. Repeat ABG at 5 AM.      Intervention Category Major Interventions: Acid-Base disturbance - evaluation and management;Respiratory failure - evaluation and management  Lysle Dingwall 05/31/2020, 3:34 AM

## 2020-05-31 NOTE — Plan of Care (Signed)
  Problem: Clinical Measurements: Goal: Respiratory complications will improve Outcome: Progressing Goal: Cardiovascular complication will be avoided Outcome: Progressing   Problem: Nutrition: Goal: Adequate nutrition will be maintained Outcome: Progressing   Problem: Pain Managment: Goal: General experience of comfort will improve Outcome: Progressing   Problem: Respiratory: Goal: Patent airway maintenance will improve Outcome: Progressing

## 2020-05-31 NOTE — Progress Notes (Signed)
PT Cancellation Note  Patient Details Name: [facility]. MRN: [id number] DOB: November 18, 1946   Cancelled Treatment:    Reason Eval/Treat Not Completed: Medical issues which prohibited therapy; patient still too unstable for PT.  Will sign off and await new order when pt able to participate.    Reginia Naas 05/31/2020, 10:32 AM  Magda Kiel, [place] [phone] 05/31/2020

## 2020-05-31 NOTE — Progress Notes (Signed)
Patient ID: Louis Ford., male   DOB: [birth year], 74 y.o.   MRN: [id number] BP 106/89   Pulse (!) 111   Temp 98.8 F (37.1 C) (Axillary)   Resp (!) 29   Ht 6\' 2"  (1.88 m) Comment: Measured by RT  Wt 108.5 kg   SpO2 95%   BMI 30.71 kg/m  Not responsive Ventilated No changes, no improvement Continue current management

## 2020-06-01 ENCOUNTER — Inpatient Hospital Stay (HOSPITAL_COMMUNITY): Payer: Medicare Other

## 2020-06-01 LAB — BLOOD CULTURE ID PANEL (REFLEXED)

## 2020-06-01 LAB — CBC WITH DIFFERENTIAL/PLATELET
Abs Immature Granulocytes: 0.68 10*3/uL — ABNORMAL HIGH (ref 0.00–0.07)
Basophils Absolute: 0.1 10*3/uL (ref 0.0–0.1)
Basophils Relative: 0 %
Eosinophils Absolute: 0.2 10*3/uL (ref 0.0–0.5)
Eosinophils Relative: 1 %
HCT: 21.2 % — ABNORMAL LOW (ref 39.0–52.0)
Hemoglobin: 6.5 g/dL — CL (ref 13.0–17.0)
Immature Granulocytes: 3 %
Lymphocytes Relative: 3 %
Lymphs Abs: 0.6 10*3/uL — ABNORMAL LOW (ref 0.7–4.0)
MCH: 29.4 pg (ref 26.0–34.0)
MCHC: 30.7 g/dL (ref 30.0–36.0)
MCV: 95.9 fL (ref 80.0–100.0)
Monocytes Absolute: 2.6 10*3/uL — ABNORMAL HIGH (ref 0.1–1.0)
Monocytes Relative: 13 %
Neutro Abs: 16.3 10*3/uL — ABNORMAL HIGH (ref 1.7–7.7)
Neutrophils Relative %: 80 %
Platelets: 232 10*3/uL (ref 150–400)
RBC: 2.21 MIL/uL — ABNORMAL LOW (ref 4.22–5.81)
RDW: 23.3 % — ABNORMAL HIGH (ref 11.5–15.5)
WBC: 20.4 10*3/uL — ABNORMAL HIGH (ref 4.0–10.5)
nRBC: 11.3 % — ABNORMAL HIGH (ref 0.0–0.2)

## 2020-06-01 LAB — HEMOGLOBIN AND HEMATOCRIT, BLOOD
HCT: 23 % — ABNORMAL LOW (ref 39.0–52.0)
Hemoglobin: 7.2 g/dL — ABNORMAL LOW (ref 13.0–17.0)

## 2020-06-01 LAB — GLUCOSE, CAPILLARY
Glucose-Capillary: 20 mg/dL — CL (ref 70–99)
Glucose-Capillary: 106 mg/dL — ABNORMAL HIGH (ref 70–99)
Glucose-Capillary: 154 mg/dL — ABNORMAL HIGH (ref 70–99)
Glucose-Capillary: 131 mg/dL — ABNORMAL HIGH (ref 70–99)
Glucose-Capillary: 138 mg/dL — ABNORMAL HIGH (ref 70–99)
Glucose-Capillary: 124 mg/dL — ABNORMAL HIGH (ref 70–99)
Glucose-Capillary: 160 mg/dL — ABNORMAL HIGH (ref 70–99)

## 2020-06-01 LAB — COMPREHENSIVE METABOLIC PANEL
ALT: 63 U/L — ABNORMAL HIGH (ref 0–44)
AST: 91 U/L — ABNORMAL HIGH (ref 15–41)
Albumin: 1.3 g/dL — ABNORMAL LOW (ref 3.5–5.0)
Alkaline Phosphatase: 138 U/L — ABNORMAL HIGH (ref 38–126)
Anion gap: 9 (ref 5–15)
BUN: 85 mg/dL — ABNORMAL HIGH (ref 8–23)
CO2: 20 mmol/L — ABNORMAL LOW (ref 22–32)
Calcium: 7 mg/dL — ABNORMAL LOW (ref 8.9–10.3)
Chloride: 117 mmol/L — ABNORMAL HIGH (ref 98–111)
Creatinine, Ser: 2.37 mg/dL — ABNORMAL HIGH (ref 0.61–1.24)
GFR calc Af Amer: 31 mL/min — ABNORMAL LOW (ref >60)
GFR calc non Af Amer: 27 mL/min — ABNORMAL LOW (ref >60)
Glucose, Bld: 208 mg/dL — ABNORMAL HIGH (ref 70–99)
Potassium: 4.7 mmol/L (ref 3.5–5.1)
Sodium: 146 mmol/L — ABNORMAL HIGH (ref 135–145)
Total Bilirubin: 4.1 mg/dL — ABNORMAL HIGH (ref 0.3–1.2)
Total Protein: 8.3 g/dL — ABNORMAL HIGH (ref 6.5–8.1)

## 2020-06-01 LAB — PREPARE RBC (CROSSMATCH)

## 2020-06-01 MED ORDER — SODIUM CHLORIDE 0.9% IV SOLUTION: Freq: Once | INTRAVENOUS | Status: DC

## 2020-06-01 MED ORDER — WHITE PETROLATUM EX OINT
TOPICAL_OINTMENT | CUTANEOUS | Status: AC
  Filled 2020-06-01: qty 28.35

## 2020-06-01 NOTE — Progress Notes (Signed)
IV TEAM: VAST RN spoke with Primary care RN and was discussed about discontinuing the order for central line removal at this time since patient is still on vaso active infusion. Order to discontinue central line will be placed back by MD in an appropriate time.

## 2020-06-01 NOTE — Progress Notes (Signed)
NAME:  Louis Kirby., MRN:  [id number], DOB:  [birth year], LOS: 106 ADMISSION DATE:  04/23/2020, CONSULTATIO care unit N DATE:  06/01/20 REFERRING MD:  Neurosurgery, CHIEF COMPLAINT:   Fall  Brief History   74 year old male who presented to the emergency department 5/21 after a fall while walking to the bathroom was found to have pathologic compression fracture of T6 and large volume spinal epidural hemorrhage requiring hematoma evacuation and thoracic laminectomy.  He was intubated for procedure and transferred to the intensive care unit postop. This allowed to be myeloma.  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   Significant Hospital Events   5/21 admit to neurosurgery 5/22 to ICU postop 5/24 - OR for T4-T8 arthrodesis. T6 found to be infiltrated with tumor 6/3 Tracheostomy placed 6/5 tracheostomy change to 6 XLT 6/8: deteriorated overnight on 2 pressors, changed to DNR  Consults:  PCCM Hematology  Procedures:  5/22 thoracic laminectomy 6/3-tracheostomy 6/5-tracheostomy change to 6 XLT  Significant Diagnostic Tests:  05/22/2020 MR Brain wo Contrast: Negative for acute infarct Chronic hemorrhage in the left  insula, left medial temporal lobe, and in the subarachnoid space. Etiology of the hemorrhage is not apparent. No mass lesion identified.  5/30 MR Spine 10 mm lesion T1 vertebral body and pedicle on the left compatible with myeloma or metastatic disease. No cervical bone lesion. Multilevel degenerative changes in the cervical spine causing spinal and foraminal stenosis as above Posterior epidural hematoma extending to C6-7 from the thoracic spine. This is not causing compression of the visualized spinal cord. This was present on the thoracic MRI. There are postsurgical changes in the posterior soft tissues thoracic spine due to recent surgery.  5/21 CT chest with contrast>>3.7 cm x 1.5 cm low-attenuation mass along the posterior aspect of the left apex with an adjacent expansile lytic area seen within the posterior aspect of the second left rib 5/21 MRI T-spine>>Constellation of multifocal bone Metastases versus Plasmacytoma,a pathologic compression fracture of T6, and a large volume of Spinal Epidural Hemorrhage spanning from the cervical spine throughout the thoracic spine to the conus.  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 5/28 CTH IMPRESSION: 1. Stable remote infarct of the left posterior insular cortex and left superior medial temporal lobe. 2. Stable atrophy and white matter disease. 3. No acute intracranial abnormality or significant interval change. 6/1 RUQ u/S: 1. Biliary sludge and mild diffuse gallbladder wall thickening. Findings are nonspecific and could be related to cholecystitis. Other causes of diffuse gallbladder wall thickening include CHF, hypoalbuminemia, hepatitis, amongst other etiologies. If high clinical suspicion for cholecystitis persists, a nuclear medicine hepatobiliary scan could be performed to assess the patency of the cystic duct  and common bile duct. 2. Right pleural effusion.  6/4 hida: 1. Patent cystic duct. 2. No radiopharmaceutical is seen in the small bowel at 90 minutes. This is more likely due to  cholestasis than common bile duct obstruction as above. Micro Data:  5/21 MRSA screen>> negative 5/21 SARS-Cov-2>> negative 5/21 urine culture>> serratia marcescens  5/28 endotracheal culture >> pseudomonas aeruginosa 6/6 resp: pseudomonas ESBL, ecoli 6/8 blood: ngtd Antimicrobials:  Ceftriaxone 5/21 Zosyn 5/27- 6/3, 6/7 >>6/8 merrem 6/8->  Interim history/subjective:  6/9: tmax 99, wbc pending this am. hgb down to 6.5 without overt bleeding so transfusing 1u prbc. K imprroved, renal indices stable but decreased uop over past 24 hours.  6/8: tmax 100.1, wbc increased to 23K, K up and bicarb down started on bicarb infusion overnight. Will recheck BMP at 1200. Just given temporizing measures and lokelma. Also added vasopressin and escalated dosing of levo.  Abdomen distended and new tongue twitching. Spoke with wife at length who has agreed to DNR. Maintain current level of care for now otherwise. Discussed with her we will add no further pressors as already on norepi and vaso and tachycardic, known hf 6/7: On low dose Levophed Critically ill, low-grade febrile  Objective   Blood pressure (!) 150/96, pulse (!) 113, temperature 98 F (36.7 C), temperature source Axillary, resp. rate (!) 29, height _0  (1.88 m), weight 110.8 kg, SpO2 100 %. CVP:  [20 mmHg-23 mmHg] 20 mmHg  Vent Mode: PRVC FiO2 (%):  [50 %-80 %] 50 % Set Rate:  [29 bmp] 29 bmp Vt Set:  [600 mL] 600 mL PEEP:  [5 cmH20] 5 cmH20 Plateau Pressure:  [25 cmH20-29 cmH20] 28 cmH20   Intake/Output Summary (Last 24 hours) at 06/01/2020 0759 Last data filed at 06/01/2020 0700 Gross per 24 hour  Intake 5121.6 ml  Output 1500 ml  Net 3621.6 ml   Filed Weights   05/30/20 0404 05/31/20 0411 06/01/20 0500  Weight: 103.6 kg 108.5 kg 110.8 kg    GEN:   Unresponsive, critically ill, ventilated via trach HEENT: Dry oral mucosa, pupils sluggish, no JVD, no icterus CV: tachycardic, irregular rhythm, 1+ pulses PULM:  Bilateral rhonchi, fair air entry GI: BS+ , distended and firm EXT: anasarca NEURO: no withdrawal to pain, no movement in response to sound, cough with suctioning PSYCH: RASS -3 SKIN: no rashes, lesions or breakdown   Chest x-ray 6/6 personally reviewed which shows no significant change, fissural opacity persists.  Labs show hypernatremia, improving leukocytosis, worsening anemia and renal failure with Cr up to 2.3 but improved K.   Resolved Hospital Problem list     Assessment & Plan:   Acute  respiratory failure requiring mechanical ventilation s/p tracheostomy Plan No significant tracheostomy leak noted  Hold ps/atc trials with hemodynamic instability  -cxr in am  Septic shock HFrEF, CAD s/p CABG,  atrial fibrillation Last echocardiogram in 03/2020 with EF of 30 to 35%, not chronically anti-coagulated given bleeding disorder Amiodaronel Titrate pressors to map >65 -on vaso and levo, some marginal improvment  Pseudomonas ESBL HAP Persistent but improved patchy bilateral opacities.  -CXR ordered for tomorrw -RUQ [place] 6/3 with R pleural effusion  -broadened to merrem Leukocytosis:  -with some lgt improved but improving curve -blood cx pending -close monitoring as well.  -fungitell pending -cont merrem  Acute  metabolic encephalopathy which is stable.  No seizures on EEG MRI Brain negative for acute infarct, some chronic hemorrhage  -cont Seroquel -Continue propofol , dropped BP with Precedex -Enteral oxycodone standing  Hyperammonemia-6/4 77 - lactulose 30 twice daily Continue Xifaxan  Hypernatremia , improving gradually -Continue free water (301 mL q 4)  Metabolic acidosis:  -improved, will stop bicarb infusion -bicarb  at 20  Elevated LFTs: currently down trending, RUQ U/S with biliary sludge and  mild diffuse gallbladder wall thickening HIDA scan-patent cystic duct ? Related to seizure medication Continue to trend Abdominal distention:  -kub with large amount of gastric distention.  -will have cortrak team advance feeding tube, monitor closely -remains tolerating tube feeding   Fall with pathologic T6 fracture and epidural hematoma causing cord compression.  Status post T6 laminectomy and spine stabilization 5/25. Myeloma related with IgG Kappa spike Oncology following with Plan for bone scan and bone biopsy if patient stablizes Family has graciously opted for dnr with maintaining current level of care at this time.    Bleeding diathesis, felt to be related to paraprotein Worked up by Dr. Irene Limbo and at [facility] by [name] Work-up for possible multiple myeloma per hematology pathology pending TXA and FFP before procedures  Consider tag to better define clotting disorder. Transfuse for HGB < 7  History of seizure disorder Sound like both tonic-clonic, followed by neurology Continue Keppra and Dilantin per Neuro -new tongue twitching previously not associated with sz has resolved today  Prediabetes with Hypoglycemia: resolved -cont ssi -follow CBGs  Overall prognosis is guarded to poor. Family has graciously opted for DNR with his recent decline. They are clear they would not want dialysis for the pt. We will otherwise maintain current level of care and update plan and goals as necessary  Daily Goals Checklist  Pain/Anxiety/Delirium protocol (if indicated): Propofol, enteral oxycodone DVT prophylaxis: SCD's only due to bleeding diathesis.  Nutrition Status: Cortrak, TFs GI prophylaxis: pantoprazole Glucose control: T2DM with SSI, Levemir  Mobility/therapy needs: Bedrest Code Status: Full  Family Communication: 6/9 wife via phone Disposition: ICU  Critical care time: The patient is critically ill with multiple organ systems failure and requires high complexity decision making  for assessment and support, frequent evaluation and titration of therapies, application of advanced monitoring technologies and extensive interpretation of multiple databases.  Critical care time 42 mins. This represents my time independent of the NPs time taking care of the pt. This is excluding procedures.    Audria Nine DO [facility] 06/01/2020, 7:59 AM

## 2020-06-01 NOTE — Progress Notes (Signed)
Patient ID: Louis Dandy., male   DOB: [birth year], 74 y.o.   MRN: [id number] BP 99/72 (BP Location: Right Arm)   Pulse (!) 132   Temp (!) 100.8 F (38.2 C) (Axillary)   Resp (!) 39   Ht 6\' 2"  (1.88 m) Comment: Measured by RT  Wt 110.8 kg   SpO2 100%   BMI 31.36 kg/m  Non responsive Pupils reactive to light Remains on ventilator, no improvement No change

## 2020-06-01 NOTE — Procedures (Signed)
Cortrak  Person Inserting Tube:  Esaw Dace, RD Tube Type:  Cortrak - 43 inches Tube Location:  Left nare Secured by: Bridle [employer] Used to Measure Tube Placement:  Documented cm marking at nare/ corner of mouth Cortrak Secured At:  89 cm    Cortrak Tube Team Note:  Consult received to place a Cortrak feeding tube.   X-ray is required, abdominal x-ray has been ordered by the Cortrak team. Please confirm tube placement before using the Cortrak tube.   If the tube becomes dislodged please keep the tube and contact the Cortrak team at [email or web address] ([email or web address]) for replacement.  If after hours and replacement cannot be delayed, place a NG tube and confirm placement with an abdominal x-ray.   Kerman Passey MS, RDN, LDN, CNSC Registered Dietitian III RD Pager Number and RD On-Call Pager Number Located in [place]

## 2020-06-01 NOTE — Progress Notes (Signed)
[facility] Physician-Brief Progress Note Patient Name: [facility]. DOB: 10-02-1946 MRN: [id number]   Date of Service  06/01/2020  HPI/Events of Note  Anemia - Hgb = 6.5.   eICU Interventions  Transfuse 1 unit PRBC.     Intervention Category Major Interventions: Other:  Lysle Dingwall 06/01/2020, 6:38 AM

## 2020-06-01 NOTE — Progress Notes (Signed)
PHARMACY - PHYSICIAN COMMUNICATION CRITICAL VALUE ALERT - BLOOD CULTURE IDENTIFICATION (BCID)  [facility]. is an 74 y.o. male who presented to [facility] on 05/04/2020 with a chief complaint of fall causing epidural hematoma and spinal cord compression.   Assessment: TA culture positive with pseudomonas and e.coli sensitive to meropenem. Patient transitioned from Zosyn to Meropenem on 6/8 because E.coli was resistant to Zosyn. Lab called with BCID positive for E.coli in the blood.   Name of physician (or Provider) Contacted: Audria Nine   Current antibiotics: Meropenem 1 g IV Q12H  Changes to prescribed antibiotics recommended:  Patient is on recommended antibiotics - No changes needed  Results for orders placed or performed during the hospital encounter of 05/05/2020  Blood Culture ID Panel (Reflexed) (Collected: 05/31/2020  9:51 AM)  Result Value Ref Range   Enterococcus species NOT DETECTED NOT DETECTED   Listeria monocytogenes NOT DETECTED NOT DETECTED   Staphylococcus species NOT DETECTED NOT DETECTED   Staphylococcus aureus (BCID) NOT DETECTED NOT DETECTED   Streptococcus species NOT DETECTED NOT DETECTED   Streptococcus agalactiae NOT DETECTED NOT DETECTED   Streptococcus pneumoniae NOT DETECTED NOT DETECTED   Streptococcus pyogenes NOT DETECTED NOT DETECTED   Acinetobacter baumannii NOT DETECTED NOT DETECTED   Enterobacteriaceae species DETECTED (A) NOT DETECTED   Enterobacter cloacae complex NOT DETECTED NOT DETECTED   Escherichia coli DETECTED (A) NOT DETECTED   Klebsiella oxytoca NOT DETECTED NOT DETECTED   Klebsiella pneumoniae NOT DETECTED NOT DETECTED   Proteus species NOT DETECTED NOT DETECTED   Serratia marcescens NOT DETECTED NOT DETECTED   Carbapenem resistance NOT DETECTED NOT DETECTED   Haemophilus influenzae NOT DETECTED NOT DETECTED   Neisseria meningitidis NOT DETECTED NOT DETECTED   Pseudomonas aeruginosa NOT DETECTED NOT DETECTED   Candida albicans  NOT DETECTED NOT DETECTED   Candida glabrata NOT DETECTED NOT DETECTED   Candida krusei NOT DETECTED NOT DETECTED   Candida parapsilosis NOT DETECTED NOT DETECTED   Candida tropicalis NOT DETECTED NOT DETECTED   Lorel Monaco, PharmD PGY1 Ambulatory Care Resident [employer] # [phone]

## 2020-06-01 NOTE — Progress Notes (Signed)
CRITICAL VALUE ALERT  Critical Value:  Hgb- 6.5  Date & Time Notied:  06/01/20 @ 0635   Provider Notified: Oletta Darter  Orders Received/Actions taken: PRBCs ordered

## 2020-06-01 NOTE — Progress Notes (Signed)
Attempted to place TL PICC on the right upper arm via the brachial and cephalic veins. Unable to thread pass the shoulder area. RN made and will notify MD.

## 2020-06-01 NOTE — Progress Notes (Signed)
Nutrition Follow-up  DOCUMENTATION CODES:   Not applicable  INTERVENTION:   Pivot 1.5 @ 60 ml/hr via Cortrak tube  Provides: 2160 kcal, 135 grams protein, and 1092 ml free water.   200 ml free water every 4 hours  Total free water: 2292 ml   MVI with minerals daily  NUTRITION DIAGNOSIS:   Increased nutrient needs related to post-op healing as evidenced by estimated needs. Ongoing.   GOAL:   Patient will meet greater than or equal to 90% of their needs Not met.   MONITOR:   Vent status, TF tolerance  REASON FOR ASSESSMENT:   Malnutrition Screening Tool    ASSESSMENT:   Pt with PMH of HTN, HLD, CAD s/p CABG, ischemic cardiomyopathy, CHF, AICD, Afib, CVA, bleeding diathesis factor VII deficiency, and sz disorder who was admitted 5/21 after falling while using walker to walk to his bathroom with compression fx of t6, large volume spinal epidural hemorrhage.   Pt discussed during ICU rounds and with RN.  Per RN pt with increasing gastric distention. Continues to tolerate TF with emesis and having BMs. Discussed with MD. Per MD concern for gastric distention which is confirmed by xray. Plan to advance cortrak tube post pyloric if possible today.   5/21 s/p thoracic laminectomy for hematoma evacuation, spinal tumor removed; pathology pending 5/22 extubated 5/24 s/p T4-T8 arthrodesis; T6 with tumor 5/26 extubated 5/28 re-intubated early this am; plan for cortrak this am 6/3 trach   Patient is currently intubated on ventilator support MV: 17.2 L/min Temp (24hrs), Avg:98.8 F (37.1 C), Min:97.6 F (36.4 C), Max:99.9 F (37.7 C)   Medications reviewed and include: SSI, lactulose BID, MVI with minerals, phenytoin (not holding TF for administration) miralax (held), xifaxan  Levophed @ 25 mcg  Vasopressin @ .03 units  Labs reviewed: Na 146 (H)Ammonia: 77 (H)  Weight up from admission by at least 13 kg  Pt is 24 L positive with moderate to deep pitting edema  Diet  Order:   Diet Order            Diet NPO time specified  Diet effective midnight              EDUCATION NEEDS:   No education needs have been identified at this time  Skin:  Skin Assessment: Reviewed RN Assessment(back incision)  Last BM:  575 ml via rectal tube - on lactulose  Height:   Ht Readings from Last 1 Encounters:  04/26/2020 6' 2"  (1.88 m)    Weight:   Wt Readings from Last 1 Encounters:  06/01/20 110.8 kg    Ideal Body Weight:  78.1 kg  BMI:  Body mass index is 31.36 kg/m.  Estimated Nutritional Needs:   Kcal:  2200  Protein:  130-150 grams  Fluid:  > 2 L/day  Lockie Pares., RD, LDN, CNSC See AMiON for contact information

## 2020-06-01 NOTE — Progress Notes (Signed)
OT Cancellation Note  Patient Details Name: [facility]. MRN: [id number] DOB: 1946/02/02   Cancelled Treatment:    Reason Eval/Treat Not Completed: Patient at procedure or test/ unavailable;Other (comment)(HgB 6.5) Pt having sterile procedure. Will follow up on later date.   Ramond Dial, OT/L   Acute OT Clinical Specialist Acute Rehabilitation Services Pager [phone] Office [phone]  06/01/2020, 10:23 AM

## 2020-06-02 ENCOUNTER — Inpatient Hospital Stay (HOSPITAL_COMMUNITY): Payer: Medicare Other

## 2020-06-02 LAB — CBC WITH DIFFERENTIAL/PLATELET
Abs Immature Granulocytes: 1.02 10*3/uL — ABNORMAL HIGH (ref 0.00–0.07)
Basophils Absolute: 0 10*3/uL (ref 0.0–0.1)
Basophils Relative: 0 %
Eosinophils Absolute: 0.2 10*3/uL (ref 0.0–0.5)
Eosinophils Relative: 1 %
HCT: 22.7 % — ABNORMAL LOW (ref 39.0–52.0)
Hemoglobin: 7.2 g/dL — ABNORMAL LOW (ref 13.0–17.0)
Immature Granulocytes: 5 %
Lymphocytes Relative: 3 %
Lymphs Abs: 0.6 10*3/uL — ABNORMAL LOW (ref 0.7–4.0)
MCH: 29.6 pg (ref 26.0–34.0)
MCHC: 31.7 g/dL (ref 30.0–36.0)
MCV: 93.4 fL (ref 80.0–100.0)
Monocytes Absolute: 2.8 10*3/uL — ABNORMAL HIGH (ref 0.1–1.0)
Monocytes Relative: 15 %
Neutro Abs: 14.2 10*3/uL — ABNORMAL HIGH (ref 1.7–7.7)
Neutrophils Relative %: 76 %
Platelets: 198 10*3/uL (ref 150–400)
RBC: 2.43 MIL/uL — ABNORMAL LOW (ref 4.22–5.81)
RDW: 22.4 % — ABNORMAL HIGH (ref 11.5–15.5)
WBC: 18.9 10*3/uL — ABNORMAL HIGH (ref 4.0–10.5)
nRBC: 7.5 % — ABNORMAL HIGH (ref 0.0–0.2)

## 2020-06-02 LAB — COMPREHENSIVE METABOLIC PANEL
ALT: 61 U/L — ABNORMAL HIGH (ref 0–44)
AST: 94 U/L — ABNORMAL HIGH (ref 15–41)
Albumin: 1.2 g/dL — ABNORMAL LOW (ref 3.5–5.0)
Alkaline Phosphatase: 162 U/L — ABNORMAL HIGH (ref 38–126)
Anion gap: 7 (ref 5–15)
BUN: 93 mg/dL — ABNORMAL HIGH (ref 8–23)
CO2: 21 mmol/L — ABNORMAL LOW (ref 22–32)
Calcium: 6.7 mg/dL — ABNORMAL LOW (ref 8.9–10.3)
Chloride: 117 mmol/L — ABNORMAL HIGH (ref 98–111)
Creatinine, Ser: 2.06 mg/dL — ABNORMAL HIGH (ref 0.61–1.24)
GFR calc Af Amer: 36 mL/min — ABNORMAL LOW (ref >60)
GFR calc non Af Amer: 31 mL/min — ABNORMAL LOW (ref >60)
Glucose, Bld: 228 mg/dL — ABNORMAL HIGH (ref 70–99)
Potassium: 4.1 mmol/L (ref 3.5–5.1)
Sodium: 145 mmol/L (ref 135–145)
Total Bilirubin: 5.3 mg/dL — ABNORMAL HIGH (ref 0.3–1.2)
Total Protein: 7.8 g/dL (ref 6.5–8.1)

## 2020-06-02 LAB — TYPE AND SCREEN
ABO/RH(D): O POS
Antibody Screen: NEGATIVE
Unit division: 0

## 2020-06-02 LAB — TRIGLYCERIDES: Triglycerides: 122 mg/dL (ref <150)

## 2020-06-02 LAB — GLUCOSE, CAPILLARY
Glucose-Capillary: 194 mg/dL — ABNORMAL HIGH (ref 70–99)
Glucose-Capillary: 175 mg/dL — ABNORMAL HIGH (ref 70–99)
Glucose-Capillary: 162 mg/dL — ABNORMAL HIGH (ref 70–99)
Glucose-Capillary: 133 mg/dL — ABNORMAL HIGH (ref 70–99)

## 2020-06-02 LAB — BPAM RBC
Blood Product Expiration Date: 202106102359
ISSUE DATE / TIME: 202106091228
Unit Type and Rh: 5100

## 2020-06-02 LAB — FUNGITELL, SERUM: Fungitell Result: 54 pg/mL (ref <80)

## 2020-06-02 LAB — PHENYTOIN LEVEL, TOTAL: Phenytoin Lvl: 6 ug/mL — ABNORMAL LOW (ref 10.0–20.0)

## 2020-06-02 MED ORDER — DEXTROSE 5 % IV SOLN: INTRAVENOUS | Status: DC

## 2020-06-02 MED ORDER — ACETAMINOPHEN 650 MG RE SUPP: 650.0000 mg | Freq: Four times a day (QID) | RECTAL | Status: DC | PRN

## 2020-06-02 MED ORDER — DIPHENHYDRAMINE HCL 50 MG/ML IJ SOLN: 25.0000 mg | INTRAMUSCULAR | Status: DC | PRN

## 2020-06-02 MED ORDER — FREE WATER
100.0000 mL | Status: DC
  Administered 2020-06-02 (×2): 100 mL

## 2020-06-02 MED ORDER — GLYCOPYRROLATE 0.2 MG/ML IJ SOLN
0.2000 mg | INTRAMUSCULAR | Status: DC | PRN
  Administered 2020-06-02: 0.2 mg via INTRAVENOUS
  Filled 2020-06-02: qty 1

## 2020-06-02 MED ORDER — MORPHINE SULFATE (PF) 2 MG/ML IV SOLN
2.0000 mg | INTRAVENOUS | Status: DC | PRN
  Administered 2020-06-02 (×2): 2 mg via INTRAVENOUS
  Administered 2020-06-02: 4 mg via INTRAVENOUS
  Filled 2020-06-02 (×2): qty 2

## 2020-06-02 MED ORDER — GLYCOPYRROLATE 0.2 MG/ML IJ SOLN: 0.2000 mg | INTRAMUSCULAR | Status: DC | PRN

## 2020-06-02 MED ORDER — MIDAZOLAM HCL 2 MG/2ML IJ SOLN: 2.0000 mg | INTRAMUSCULAR | Status: DC | PRN

## 2020-06-02 MED ORDER — GLYCOPYRROLATE 1 MG PO TABS
1.0000 mg | ORAL_TABLET | ORAL | Status: DC | PRN
  Filled 2020-06-02: qty 1

## 2020-06-02 MED ORDER — POLYVINYL ALCOHOL 1.4 % OP SOLN
1.0000 [drp] | Freq: Four times a day (QID) | OPHTHALMIC | Status: DC | PRN
  Filled 2020-06-02: qty 15

## 2020-06-02 MED ORDER — FUROSEMIDE 10 MG/ML IJ SOLN
40.0000 mg | Freq: Once | INTRAMUSCULAR | Status: AC
  Administered 2020-06-02: 40 mg via INTRAVENOUS
  Filled 2020-06-02: qty 4

## 2020-06-02 MED ORDER — ACETAMINOPHEN 325 MG PO TABS: 650.0000 mg | ORAL_TABLET | Freq: Four times a day (QID) | ORAL | Status: DC | PRN

## 2020-06-03 LAB — CULTURE, RESPIRATORY W GRAM STAIN

## 2020-06-04 LAB — CULTURE, BLOOD (ROUTINE X 2)

## 2020-06-05 LAB — CULTURE, BLOOD (ROUTINE X 2): Culture: NO GROWTH

## 2020-06-07 LAB — CULTURE, BLOOD (ROUTINE X 2)

## 2020-06-23 NOTE — Progress Notes (Signed)
Phenytoin Follow Up Consult Indication: Seizures  No Known Allergies  Patient Measurements: Height: 6' 2"  (188 cm) (Measured by RT) Weight: 112.6 kg (248 lb 3.8 oz) IBW/kg (Calculated) : 82.2 TPN AdjBW (KG): 87 Body mass index is 31.87 kg/m.   Vital signs: Temp: 97.5 F (36.4 C) (06/10 1200) Temp Source: Axillary (06/10 1200) BP: 99/64 (06/10 1430) Pulse Rate: 74 (06/10 1430)  Labs: Lab Results  Component Value Date/Time   Albumin 1.2 (L) 02-Jul-2020 0503   Phenytoin Lvl 6.0 (L) 07/02/2020 0503   Lab Results  Component Value Date   PHENYTOIN 6.0 (L) 2020-07-02   VALPROATE 21 (L) 05/20/2020   Estimated Creatinine Clearance: 43.9 mL/min (A) (by C-G formula based on SCr of 2.06 mg/dL (H)).   Medications:  Scheduled:  . sodium chloride   Intravenous Once  . amiodarone  200 mg Per Tube Daily  . chlorhexidine gluconate (MEDLINE KIT)  15 mL Mouth Rinse BID  . Chlorhexidine Gluconate Cloth  6 each Topical Daily  . feeding supplement (PIVOT 1.5 CAL)  1,000 mL Per Tube Q24H  . free water  100 mL Per Tube Q4H  . insulin aspart  0-9 Units Subcutaneous Q4H  . lactulose  30 g Per Tube BID  . levETIRAcetam  1,000 mg Per Tube BID  . mouth rinse  15 mL Mouth Rinse 10 times per day  . multivitamin with minerals  1 tablet Per Tube Daily  . oxyCODONE  10 mg Per Tube Q8H  . pantoprazole sodium  40 mg Per Tube Daily  . phenytoin  75 mg Per Tube BID  . polyethylene glycol  17 g Per Tube Daily  . QUEtiapine  25 mg Per Tube BID  . rifaximin  550 mg Per Tube BID    Assessment:  83 YOM who presented on 5/21 with fall causing epidural hematoma and spinal cord compression. The patient was on phenytoin PTA for hx seizure which was resumed on admission.  The patient was noted to have worsening mentation with elevated LFTs and ammonia on 5/28. Phenytoin levels were checked on 5/28 and 5/31 and noted to be high - likely related to the hepatic function which would effect phenytoin clearance.  Levels were again checked on 6/3 AM with a corrected level of 30 mcg/ml - doses were held at that time.   Repeat daily phenytoin levels were as follows: 6/4 DPH level 11.7, alb 1.6, corrects to 27.8 - continue to hold, recheck in AM 6/5 DPH level 9.4, alb 1.5, corrects to 23.5 - hold 6/6 DPH level 8.4, alb 1.4, corrects to 22.1 - hold 6/7 DPH level 6.5, alb 1.3, corrects to 18 - restarted phenytoin 6/10 DPH level 6.0, alb 1.2, corrects to 14  Since phenytoin level remains therapeutic, continue phenytoin 75 mg BID  Corrected phenytoin level (if needed): 23.5 Seizure activity: None noted Significant potential drug interactions: n/a  Goals of care:  Total phenytoin level: 10-20 mcg/ml Free phenytoin level: 1-2 mcg/ml  Plan:  - Continue phenytoin 75 mg twice daily via tube  Thank you for involving pharmacy in this patient's care.  Vertis Kelch, PharmD, [facility] PGY2 Cardiology Pharmacy Resident Phone [phone] July 02, 2020       2:53 PM  Please check [email or web address] for unit-specific pharmacist phone numbers

## 2020-06-23 NOTE — Progress Notes (Signed)
Patient ID: Louis Ford., male   DOB: 1946-02-13, 74 y.o.   MRN: [id number] BP 100/62    Pulse 73    Temp 97.8 F (36.6 C) (Axillary)    Resp (!) 29    Ht 6\' 2"  (1.88 m) Comment: Measured by RT   Wt 112.6 kg    SpO2 (!) 80%    BMI 31.87 kg/m  Discussion had with wife. She has decided given lack of improvement to withdraw measures undertaken to extend his life. Comfort care measures instituted today.  I agree with this decision.

## 2020-06-23 NOTE — Discharge Summary (Signed)
Physician Discharge Summary  Patient ID: [facility]. MRN: [id number] DOB/AGE: 72-Dec-1947 72 y.o.  Admit date: 04/26/2020 Discharge date: 06/03/2020  Admission [facility] lower extremities   Discharge Diagnoses:  Active Problems:   Epidural hematoma (HCC)   UTI (urinary tract infection)   Multiple myeloma (HCC)   Pathologic thoracic fracture   Bone metastases ([place])   Acute hypoxemic respiratory failure (HCC)   Bleeding diathesis ([place])   Endotracheally intubated   Discharged Condition: deceased  Hospital Course: Mr. Kolar was admitted from the ED for an emergent operation due to spinal cord compression in the thoracic spine. He had both an epidural hematoma, and compression from a myeloma, biopsy proven. Post op he was moving his lower extremities. He also developed an encephalopathy due to hyperammonemia, kidney failure, and other metabolic derangements. Despite aggressive medical care he simply did not wake up. He received a tracheostomy but was not able to wean from the ventilator. His wife expressed her desire to remove care measures to extend his life. He died after comfort care was initiated.  Treatments: surgery: thoracic laminectomy tracheostomy  Discharge Exam: Blood pressure 100/62, pulse 73, temperature 97.8 F (36.6 C), temperature source Axillary, resp. rate (!) 29, height 6' 2"  (1.88 m), weight 112.6 kg, SpO2 (!) 80 %. deceased  Disposition:  T6 MYELOMA  Allergies as of June 30, 2020   No Known Allergies     Medication List    ASK your doctor about these medications   acetaminophen 325 MG tablet Commonly known as: TYLENOL Take 650 mg by mouth every 6 (six) hours as needed for mild pain.   amiodarone 200 MG tablet Commonly known as: PACERONE Take 1 tablet (200 mg total) by mouth 2 (two) times daily.   carvedilol 3.125 MG tablet Commonly known as: COREG Take 1 tablet (3.125 mg total) by mouth 2 (two) times daily with a meal.   divalproex  500 MG 24 hr tablet Commonly known as: DEPAKOTE ER TAKE 1 TABLET BY MOUTH ONCE DAILY AT NIGHT   DULoxetine 30 MG capsule Commonly known as: CYMBALTA Take 1 capsule by mouth once daily   furosemide 40 MG tablet Commonly known as: LASIX Take 1 tablet by mouth once daily   levETIRAcetam 1000 MG tablet Commonly known as: KEPPRA Take 1 tablet (1,000 mg total) by mouth 2 (two) times daily.   phenytoin 100 MG ER capsule Commonly known as: DILANTIN Take 1 capsule (100 mg total) by mouth 2 (two) times daily.   simvastatin 20 MG tablet Commonly known as: ZOCOR TAKE 1 TABLET BY MOUTH ONCE DAILY AT 6PM   tamsulosin 0.4 MG Caps capsule Commonly known as: FLOMAX Take 1 capsule by mouth once daily   traMADol 50 MG tablet Commonly known as: ULTRAM Take 1 tablet (50 mg total) by mouth every 6 (six) hours as needed for moderate pain. for pain        Signed: [name] 06/03/2020, 12:53 PM

## 2020-06-23 NOTE — Progress Notes (Signed)
Updated wife via phone.   D/w her pt's lack of improvement and infact deterioration with multi organ system failure. Discussed need for change of cvc today.   We reapproached what the pt's goals would be, what he would deem acceptable quality of life. I discussed with her that should the pt survive the new sepsis on top of his neuro status, renal failure, shock etc. He still has the underlying monoclonal paraproteinemia suspicious for myeloma along with the spinal metastases/plasmacytomas.   We went over the invasiveness of yet another procedure (repeat cvc) for potentially no change in outcome.   She is processing this information. She states she wants to come visit today and will speak again with me about this at that time.   She has opted to defer the replacement of cvc at this time until she can come to decision of what would be best for her husband. She expressed understands the risks of keeping current line with positive blood cx.   Updated RN via phone.

## 2020-06-23 NOTE — Progress Notes (Addendum)
NAME:  Louis Ford., MRN:  [id number], DOB:  04-Oct-1946, LOS: 23 ADMISSION DATE:  05/02/2020, CONSULTATIO care unit N DATE:  2020-06-25 REFERRING MD:  Neurosurgery, CHIEF COMPLAINT:   Fall  Brief History   74 year old male who presented to the emergency department 5/21 after a fall while walking to the bathroom was found to have pathologic compression fracture of T6 and large volume spinal epidural hemorrhage requiring hematoma evacuation and thoracic laminectomy.  He was intubated for procedure and transferred to the intensive care unit postop. This allowed to be myeloma.  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   Significant Hospital Events   5/21 admit to neurosurgery 5/22 to ICU postop 5/24 - OR for T4-T8 arthrodesis. T6 found to be infiltrated with tumor 6/3 Tracheostomy placed 6/5 tracheostomy change to 6 XLT 6/8: deteriorated overnight on 2 pressors, changed to DNR  Consults:  PCCM Hematology  Procedures:  5/22 thoracic laminectomy 6/3-tracheostomy 6/5-tracheostomy change to 6 XLT  Significant Diagnostic Tests:  05/22/2020 MR Brain wo Contrast: Negative for acute infarct Chronic hemorrhage in the left  insula, left medial temporal lobe, and in the subarachnoid space. Etiology of the hemorrhage is not apparent. No mass lesion identified.  5/30 MR Spine 10 mm lesion T1 vertebral body and pedicle on the left compatible with myeloma or metastatic disease. No cervical bone lesion. Multilevel degenerative changes in the cervical spine causing spinal and foraminal stenosis as above Posterior epidural hematoma extending to C6-7 from the thoracic spine. This is not causing compression of the visualized spinal cord. This was present on the thoracic MRI. There are postsurgical changes in the posterior soft tissues thoracic spine due to recent surgery.  5/21 CT chest with contrast>>3.7 cm x 1.5 cm low-attenuation mass along the posterior aspect of the left apex with an adjacent expansile lytic area seen within the posterior aspect of the second left rib 5/21 MRI T-spine>>Constellation of multifocal bone Metastases versus Plasmacytoma,a pathologic compression fracture of T6, and a large volume of Spinal Epidural Hemorrhage spanning from the cervical spine throughout the thoracic spine to the conus.  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 5/28 CTH IMPRESSION: 1. Stable remote infarct of the left posterior insular cortex and left superior medial temporal lobe. 2. Stable atrophy and white matter disease. 3. No acute intracranial abnormality or significant interval change. 6/1 RUQ u/S: 1. Biliary sludge and mild diffuse gallbladder wall thickening. Findings are nonspecific and could be related to cholecystitis. Other causes of diffuse gallbladder wall thickening include CHF, hypoalbuminemia, hepatitis, amongst other etiologies. If high clinical suspicion for cholecystitis persists, a nuclear medicine hepatobiliary scan could be performed to assess the patency of the cystic duct  and common bile duct. 2. Right pleural effusion.  6/4 hida: 1. Patent cystic duct. 2. No radiopharmaceutical is seen in the small bowel at 90 minutes. This is more likely due to  cholestasis than common bile duct obstruction as above. Micro Data:  5/21 MRSA screen>> negative 5/21 SARS-Cov-2>> negative 5/21 urine culture>> serratia marcescens  5/28 endotracheal culture >> pseudomonas aeruginosa 6/6 resp: pseudomonas ESBL, ecoli 6/8 blood: 1 positive for ecoli Antimicrobials:  Ceftriaxone 5/21 Zosyn 5/27- 6/3, 6/7 >>6/8 merrem 6/8->  Interim history/subjective:  6/10: tmax100.8, wbc pending but was slightly down from 23->20 yesterday. hgb appropriately respondined 6->7 with 1uprbc tranfusion. Renal function improved, remains on pressors. Unfortunately blood cx positive for ecoli (awaiting sensitivities) 6/9: tmax 99, wbc pending this am. hgb down to 6.5 without overt bleeding so transfusing 1u prbc. K imprroved, renal indices stable but decreased uop over past 24 hours.  6/8: tmax 100.1, wbc increased to 23K, K up and bicarb down started on bicarb infusion overnight. Will recheck BMP at 1200. Just given temporizing measures and lokelma. Also added vasopressin and escalated dosing of levo.  Abdomen distended and new tongue twitching. Spoke with wife at length who has agreed to DNR. Maintain current level of care for now otherwise. Discussed with her we will add no further pressors as already on norepi and vaso and tachycardic, known hf 6/7: On low dose Levophed Critically ill, low-grade febrile  Objective   Blood pressure 108/76, pulse 73, temperature 98 F (36.7 C), temperature source Axillary, resp. rate (!) 29, height 6' 2"  (1.88 m), weight 112.6 kg, SpO2 100 %. CVP:  [21 mmHg-22 mmHg] 22 mmHg  Vent Mode: PRVC FiO2 (%):  [40 %-50 %] 40 % Set Rate:  [29 bmp] 29 bmp Vt Set:  [600 mL] 600 mL PEEP:  [5 cmH20] 5 cmH20 Plateau Pressure:  [27 cmH20-29 cmH20] 29 cmH20   Intake/Output  Summary (Last 24 hours) at 06-11-2020 1194 Last data filed at 06-11-20 0800 Gross per 24 hour  Intake 2925.6 ml  Output 2400 ml  Net 525.6 ml   Filed Weights   05/31/20 0411 06/01/20 0500 11-Jun-2020 0435  Weight: 108.5 kg 110.8 kg 112.6 kg    GEN:  Unresponsive, critically ill, ventilated via trach HEENT: Dry oral mucosa, pupils sluggish, no JVD, no icterus CV: tachycardic, irregular rhythm, 1+ pulses PULM:  Bilateral rhonchi, fair air entry GI: BS+ , distended and firm EXT: anasarca NEURO: no withdrawal to pain, no movement in response to sound, + cough with suctioning PSYCH: RASS -3 on 10 propofol SKIN: no rashes, lesions or breakdown   Chest x-ray 6/10 progressive R infiltrate, personally reviewed by me  Labs show improved hypernatremia, and creatinine. Cbc pending  Resolved Hospital Problem list     Assessment & Plan:   Acute  respiratory failure requiring mechanical ventilation s/p tracheostomy Plan No significant tracheostomy leak noted  Hold ps/atc trials with hemodynamic instability  -no improvement in mental status either.  -cxr with progressive R infiltrate  Septic shock HFrEF, CAD s/p CABG,  atrial fibrillation Last echocardiogram in 03/2020 with EF of 30 to 35% and some mod RHF, not chronically anti-coagulated given bleeding disorder Amiodarone -Titrate pressors to map >65 -on vaso and levo, some marginal improvement -attempting diuresis despite this 2/2 such amount of volume overload with RHF as well may be beneficial  Pseudomonas ESBL Ecoli HAP -CXR 6/10 with progressive R infiltrate -RUQ [place] 6/3 with R pleural effusion  -broadened to merrem -will d/w family about changing central line today (unable to place picc and now with bacteremia) Leukocytosis:  -with some lgt  -blood cx ecoli, pending sensitivities -close monitoring as well.  -fungitell pending -cont merrem ecoli bacteremia:  -  f/u sensitivities -2/2 hematogenous spread from ecoli  pneumonia  Acute  metabolic encephalopathy which is stable.  No seizures on EEG MRI Brain negative for acute infarct, some chronic hemorrhage  -cont Seroquel -Continue propofol at 10, unfortunately when weaned pt gets increasingly tachycardic and tachypneic. , dropped BP with Precedex -Enteral oxycodone standing  Hyperammonemia-6/4 77 - lactulose 30 twice daily Continue Xifaxan  Hypernatremia , improving gradually -Continue free water (557 mL q 4)  Metabolic acidosis:  -resolved  Elevated LFTs: currently down trending, RUQ U/S with biliary sludge and mild diffuse gallbladder wall thickening HIDA scan-patent cystic duct ? Related to seizure medication Continue to trend, stable Abdominal distention:  -kub with large amount of gastric distention.  -cortrak attempted to advance tube without improvement on imaging -remains tolerating tube feeding   Fall with pathologic T6 fracture and epidural hematoma causing cord compression.  Status post T6 laminectomy and spine stabilization 5/25. Myeloma related with IgG Kappa spike Oncology following with Plan for bone scan and bone biopsy if patient stablizes Family has graciously opted for dnr with maintaining current level of care at this time.    Bleeding diathesis, felt to be related to paraprotein Worked up by Dr. Irene Limbo and at [facility] by [name] Work-up for possible multiple myeloma per hematology pathology pending TXA and FFP before procedures  Transfuse for HGB < 7  History of seizure disorder Sound like both tonic-clonic, followed by neurology Continue Keppra and Dilantin per Neuro -new tongue twitching previously not associated with sz has resolved today  Prediabetes with Hypoglycemia: resolved -cont ssi -follow CBGs  Overall prognosis is guarded to poor. Family has graciously opted for DNR with his recent decline. They are clear they would not want dialysis for the pt. We will otherwise maintain current level of care and update  plan and goals as necessary  Daily Goals Checklist  Pain/Anxiety/Delirium protocol (if indicated): Propofol, enteral oxycodone DVT prophylaxis: SCD's only due to bleeding diathesis.  Nutrition Status: Cortrak, TFs GI prophylaxis: pantoprazole Glucose control: T2DM with SSI, Levemir  Mobility/therapy needs: Bedrest Code Status: Full  Family Communication: 6/10 wife via phone Disposition: ICU  Critical care time: The patient is critically ill with multiple organ systems failure and requires high complexity decision making for assessment and support, frequent evaluation and titration of therapies, application of advanced monitoring technologies and extensive interpretation of multiple databases.  Critical care time 47 mins. This represents my time independent of the NPs time taking care of the pt. This is excluding procedures.    [facility] 06-17-2020, 8:08 AM

## 2020-06-23 NOTE — Progress Notes (Addendum)
Updated wife at bedside. She has determined it is time for [place] to change our course of treatment to comfort.   Pt has MOSF, resp/neuro/renal/bacteremia 2/2 pna with septic shock. She is clear he would not want to live on a ventilator, he would not want dialysis and he would not appreciate a nursing home in a debilitated state.   She does not request to be present but has requested we wait to transition to comfort after her departure from the hospital.   She would like a call once he passes peacefully and with the utmost dignity.   At this time we will place comfort orders and await initiation per her request.   RN, Amy present at bedside for conversation.  Dr Christella Noa updated via phone  End of life/advanced care planning care time: The patient is critically ill with multiple organ systems failure and requires high complexity decision making for assessment and support, frequent evaluation and titration of therapies, application of advanced monitoring technologies and extensive interpretation of multiple databases.  Critical care time 32 mins. This represents my time independent of the NPs time taking care of the pt. This is excluding procedures.    [facility] June 27, 2020, 4:35 PM

## 2020-06-23 DEATH — deceased

## 2020-08-03 ENCOUNTER — Encounter (HOSPITAL_COMMUNITY): Payer: Medicare Other | Admitting: Internal Medicine

## 2020-09-01 IMAGING — CT CT HEAD WITHOUT CONTRAST
3 series · 15 of 47 positions shown, 18 images · non-contrast
Comparison: December 05, 2018

CLINICAL DATA: Pain following recent trauma

EXAM:
CT HEAD WITHOUT CONTRAST
TECHNIQUE: Contiguous axial images were obtained from the base of the skull
through the vertex without intravenous contrast.

[Series 2: head wo · axial · 0.51mm/px · z∈[+1547,+1687]mm · 9 of 34 slices shown, 12 images]
[im 3/34  brain]
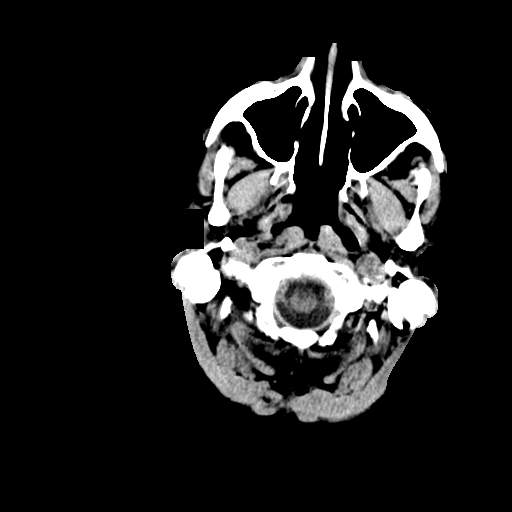
[im 3/34  bone]
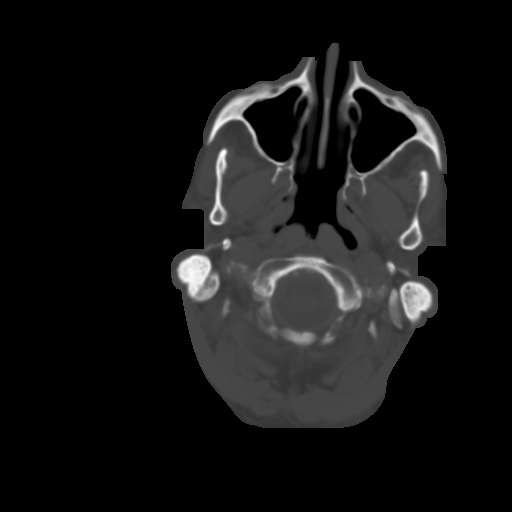
[im 6/34  brain]
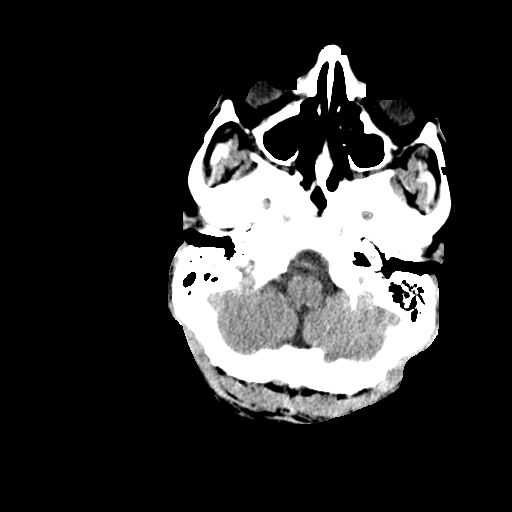
[im 10/34  brain]
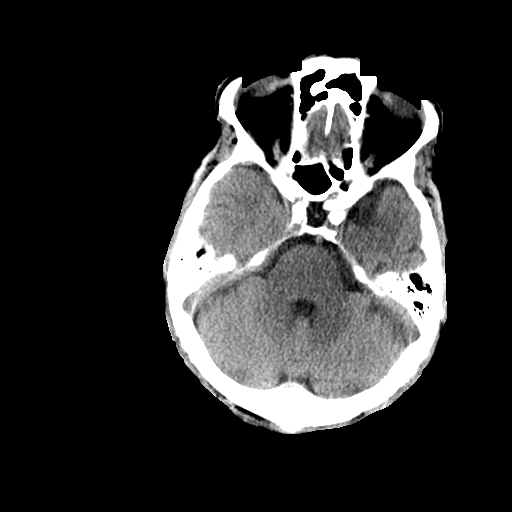
[im 13/34  brain]
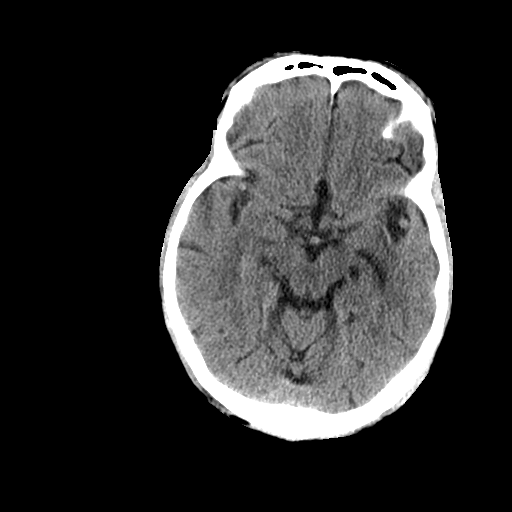
[im 18/34  brain]
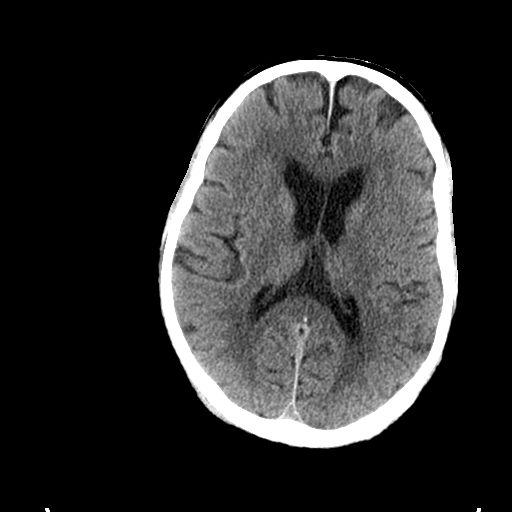
[im 18/34  bone]
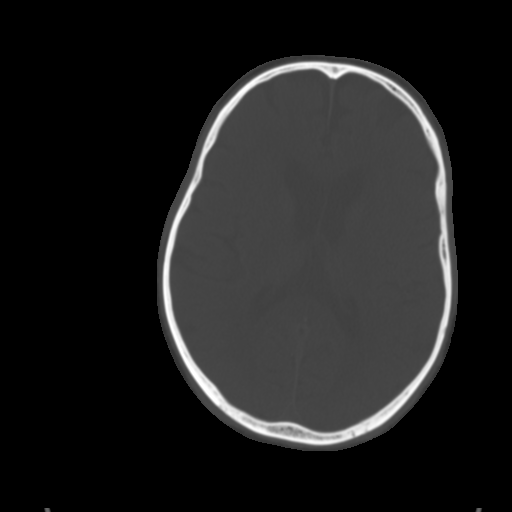
[im 21/34  brain]
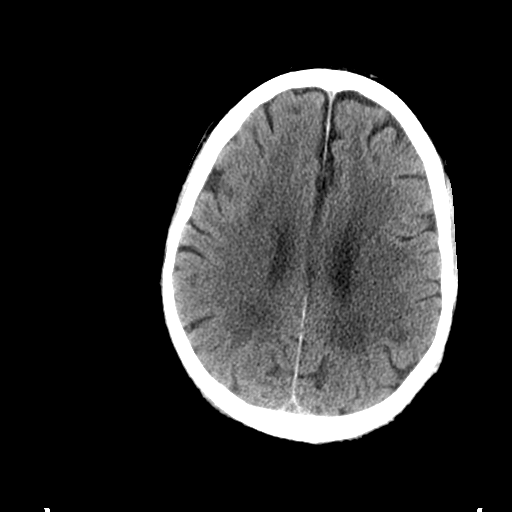
[im 24/34  brain]
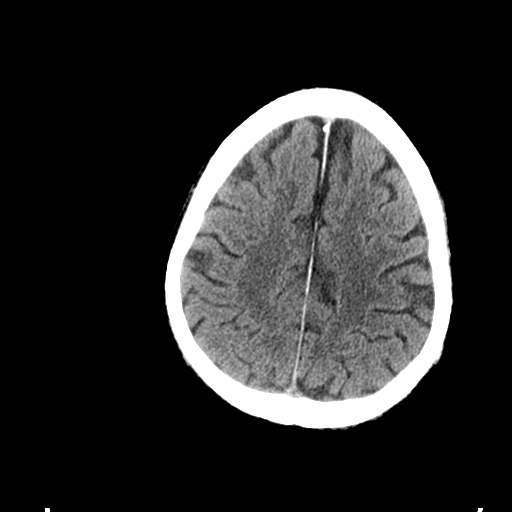
[im 28/34  brain]
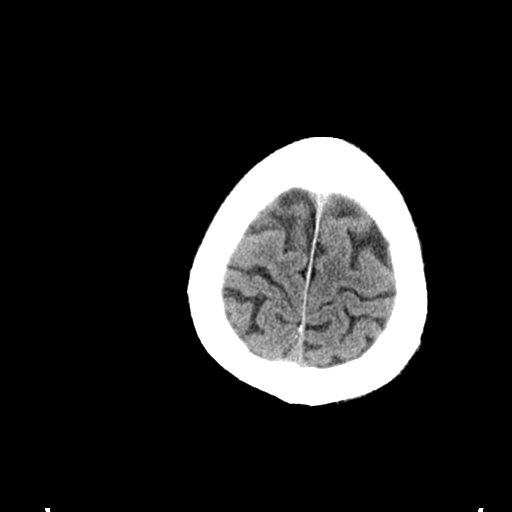
[im 31/34  brain]
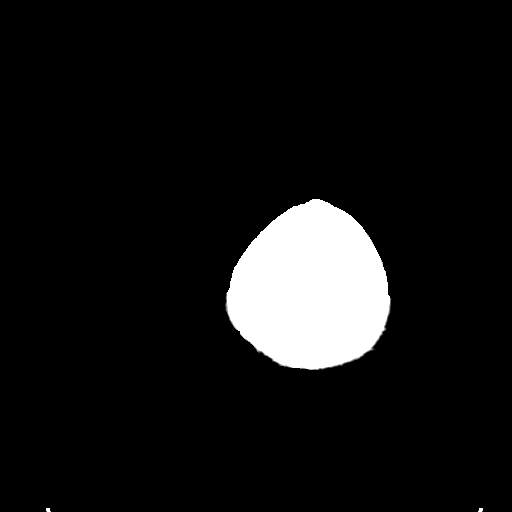
[im 31/34  bone]
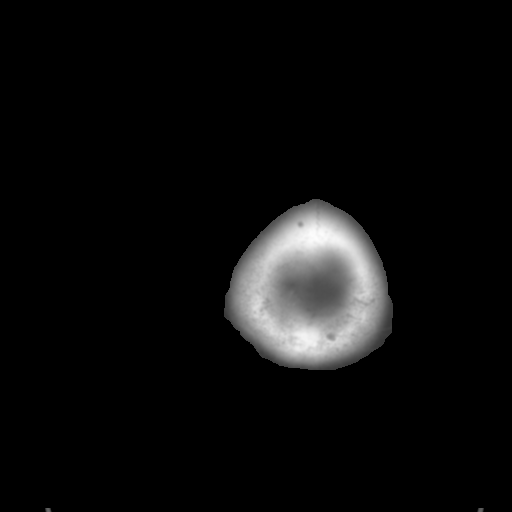

[Series 4: coronal soft tissue · coronal · 0.34mm/px · 3 of 82 slices shown]
[im 28/82  brain]
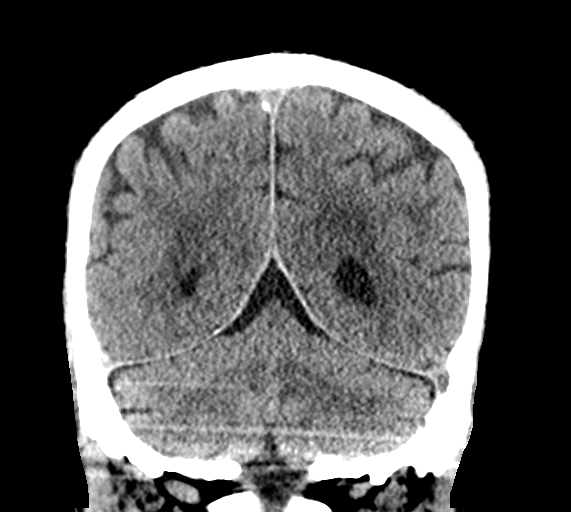
[im 37/82  brain]
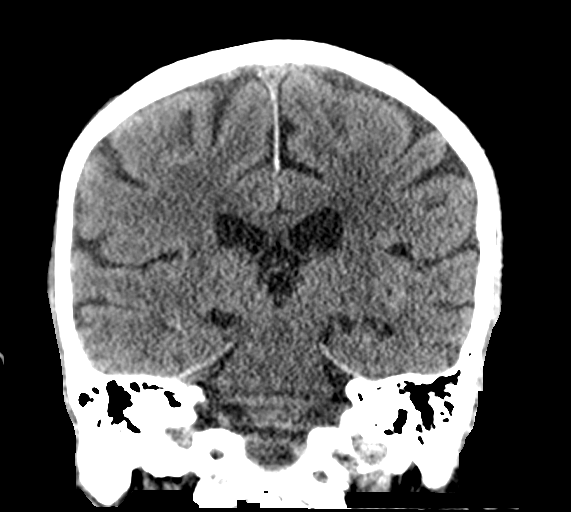
[im 46/82  brain]
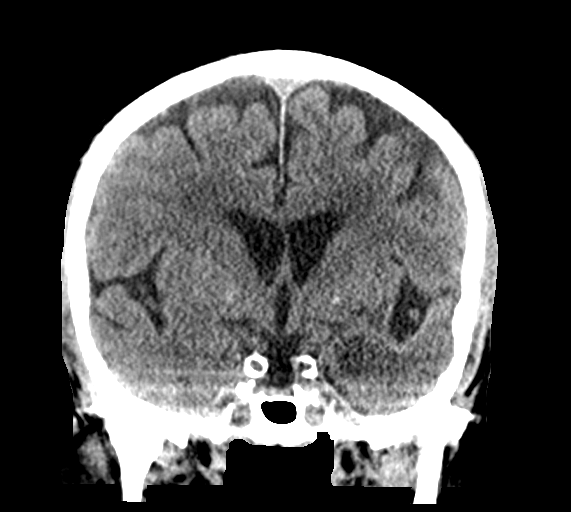

[Series 5: sagittal soft tissue · sagittal · 0.35mm/px · 3 of 60 slices shown]
[im 20/60  brain]
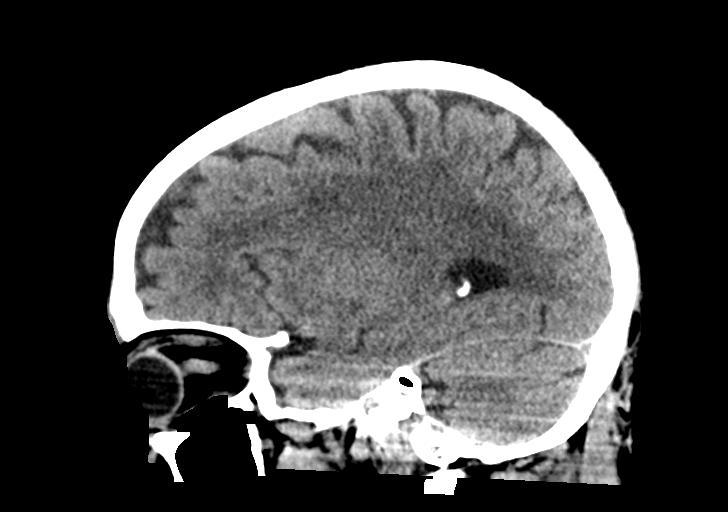
[im 30/60  brain]
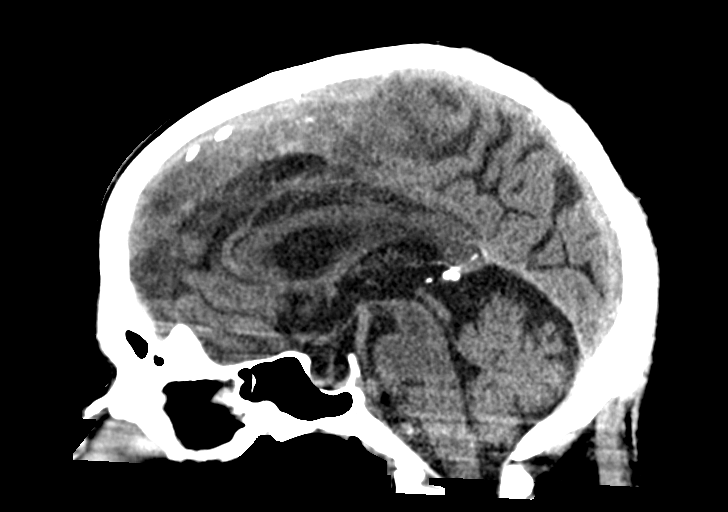
[im 40/60  brain]
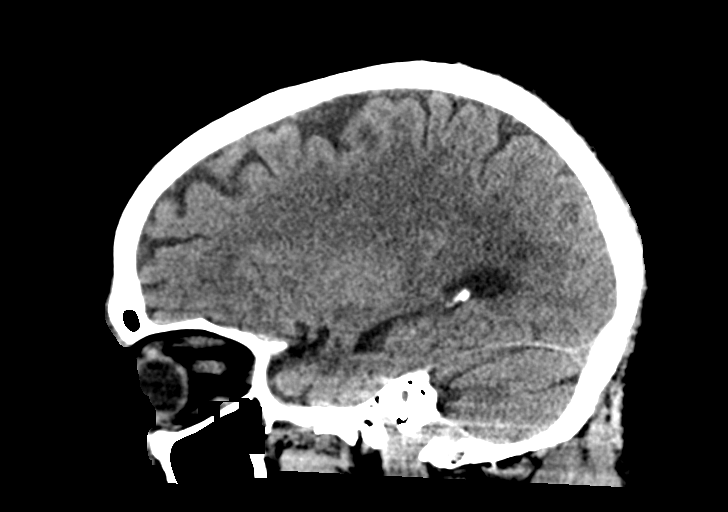

[15 of 47 positions shown; findings below may reference images not displayed]

FINDINGS: Brain: There is stable age related volume loss. There is no
intracranial mass, hemorrhage, extra-axial fluid collection, or
midline shift. There is small vessel disease in the centra semiovale
bilaterally which appear stable. No acute infarct evident.

Vascular: No hyperdense vessel. There is calcification in each
carotid siphon region as well as in each middle cerebral artery.

Skull: Bony calvarium appears intact.

Sinuses/Orbits: Visualized paranasal sinuses are clear. Orbits
appear symmetric bilaterally.

Other: Mastoid air cells are clear.
IMPRESSION: Loss with patchy periventricular small vessel disease. No acute
infarct. No mass or hemorrhage.

There are multiple foci of arterial vascular calcification.

## 2021-05-03 ENCOUNTER — Ambulatory Visit: Payer: Medicare Other | Admitting: Neurology
